# Patient Record
Sex: Male | Born: 1945 | Race: White | Hispanic: No | Marital: Married | Smoking: Former smoker
Health system: Southern US, Community
[De-identification: ages and names within clinical notes are randomized; demographics above are authoritative.]

## PROBLEM LIST (undated history)

## (undated) DIAGNOSIS — N189 Chronic kidney disease, unspecified: Secondary | ICD-10-CM

## (undated) DIAGNOSIS — F1011 Alcohol abuse, in remission: Secondary | ICD-10-CM

## (undated) DIAGNOSIS — K7460 Unspecified cirrhosis of liver: Secondary | ICD-10-CM

## (undated) DIAGNOSIS — R12 Heartburn: Secondary | ICD-10-CM

## (undated) DIAGNOSIS — D649 Anemia, unspecified: Secondary | ICD-10-CM

## (undated) DIAGNOSIS — F419 Anxiety disorder, unspecified: Secondary | ICD-10-CM

## (undated) DIAGNOSIS — I4891 Unspecified atrial fibrillation: Secondary | ICD-10-CM

## (undated) DIAGNOSIS — R0600 Dyspnea, unspecified: Secondary | ICD-10-CM

## (undated) DIAGNOSIS — M1990 Unspecified osteoarthritis, unspecified site: Secondary | ICD-10-CM

## (undated) DIAGNOSIS — K264 Chronic or unspecified duodenal ulcer with hemorrhage: Secondary | ICD-10-CM

## (undated) DIAGNOSIS — K219 Gastro-esophageal reflux disease without esophagitis: Secondary | ICD-10-CM

## (undated) DIAGNOSIS — K8020 Calculus of gallbladder without cholecystitis without obstruction: Secondary | ICD-10-CM

## (undated) DIAGNOSIS — F329 Major depressive disorder, single episode, unspecified: Secondary | ICD-10-CM

## (undated) DIAGNOSIS — H9190 Unspecified hearing loss, unspecified ear: Secondary | ICD-10-CM

## (undated) DIAGNOSIS — I10 Essential (primary) hypertension: Secondary | ICD-10-CM

## (undated) DIAGNOSIS — K7030 Alcoholic cirrhosis of liver without ascites: Secondary | ICD-10-CM

## (undated) DIAGNOSIS — D126 Benign neoplasm of colon, unspecified: Secondary | ICD-10-CM

## (undated) DIAGNOSIS — F32A Depression, unspecified: Secondary | ICD-10-CM

## (undated) DIAGNOSIS — K652 Spontaneous bacterial peritonitis: Secondary | ICD-10-CM

## (undated) DIAGNOSIS — I8500 Esophageal varices without bleeding: Secondary | ICD-10-CM

## (undated) DIAGNOSIS — E119 Type 2 diabetes mellitus without complications: Secondary | ICD-10-CM

## (undated) DIAGNOSIS — A0472 Enterocolitis due to Clostridium difficile, not specified as recurrent: Secondary | ICD-10-CM

## (undated) DIAGNOSIS — R161 Splenomegaly, not elsewhere classified: Secondary | ICD-10-CM

## (undated) HISTORY — DX: Anemia, unspecified: D64.9

## (undated) HISTORY — DX: Essential (primary) hypertension: I10

## (undated) HISTORY — DX: Depression, unspecified: F32.A

## (undated) HISTORY — DX: Alcohol abuse, in remission: F10.11

## (undated) HISTORY — DX: Spontaneous bacterial peritonitis: K65.2

## (undated) HISTORY — DX: Anxiety disorder, unspecified: F41.9

## (undated) HISTORY — DX: Type 2 diabetes mellitus without complications: E11.9

## (undated) HISTORY — DX: Unspecified cirrhosis of liver: K74.60

## (undated) HISTORY — PX: APPENDECTOMY: SHX54

## (undated) HISTORY — DX: Chronic kidney disease, unspecified: N18.9

## (undated) HISTORY — DX: Calculus of gallbladder without cholecystitis without obstruction: K80.20

## (undated) HISTORY — DX: Alcoholic cirrhosis of liver without ascites: K70.30

## (undated) HISTORY — DX: Enterocolitis due to Clostridium difficile, not specified as recurrent: A04.72

## (undated) HISTORY — DX: Unspecified osteoarthritis, unspecified site: M19.90

## (undated) HISTORY — DX: Chronic or unspecified duodenal ulcer with hemorrhage: K26.4

## (undated) HISTORY — PX: CATARACT EXTRACTION: SUR2

## (undated) HISTORY — DX: Benign neoplasm of colon, unspecified: D12.6

## (undated) HISTORY — DX: Splenomegaly, not elsewhere classified: R16.1

## (undated) HISTORY — DX: Major depressive disorder, single episode, unspecified: F32.9

## (undated) HISTORY — DX: Esophageal varices without bleeding: I85.00

## (undated) HISTORY — DX: Heartburn: R12

---

## 1999-04-02 HISTORY — PX: COLONOSCOPY: SHX174

## 2004-11-29 HISTORY — PX: ESOPHAGOGASTRODUODENOSCOPY: SHX1529

## 2004-12-31 ENCOUNTER — Ambulatory Visit: Payer: Self-pay | Admitting: Urgent Care

## 2005-01-02 ENCOUNTER — Ambulatory Visit (HOSPITAL_COMMUNITY): Admission: RE | Admit: 2005-01-02 | Discharge: 2005-01-02 | Payer: Self-pay | Admitting: Internal Medicine

## 2005-01-08 ENCOUNTER — Ambulatory Visit: Payer: Self-pay | Admitting: Internal Medicine

## 2005-01-15 ENCOUNTER — Ambulatory Visit: Payer: Self-pay | Admitting: Internal Medicine

## 2005-01-15 ENCOUNTER — Encounter: Payer: Self-pay | Admitting: Internal Medicine

## 2005-01-15 ENCOUNTER — Ambulatory Visit (HOSPITAL_COMMUNITY): Admission: RE | Admit: 2005-01-15 | Discharge: 2005-01-15 | Payer: Self-pay | Admitting: Internal Medicine

## 2005-01-15 HISTORY — PX: ESOPHAGOGASTRODUODENOSCOPY: SHX1529

## 2005-02-09 ENCOUNTER — Ambulatory Visit: Payer: Self-pay | Admitting: Urgent Care

## 2005-03-10 ENCOUNTER — Ambulatory Visit: Payer: Self-pay | Admitting: Internal Medicine

## 2005-03-10 ENCOUNTER — Encounter: Payer: Self-pay | Admitting: Internal Medicine

## 2005-03-10 ENCOUNTER — Ambulatory Visit (HOSPITAL_COMMUNITY): Admission: RE | Admit: 2005-03-10 | Discharge: 2005-03-10 | Payer: Self-pay | Admitting: Internal Medicine

## 2005-03-10 HISTORY — PX: COLONOSCOPY: SHX174

## 2005-05-13 ENCOUNTER — Ambulatory Visit: Payer: Self-pay | Admitting: Internal Medicine

## 2008-09-29 HISTORY — PX: ESOPHAGOGASTRODUODENOSCOPY: SHX1529

## 2010-03-19 ENCOUNTER — Encounter (INDEPENDENT_AMBULATORY_CARE_PROVIDER_SITE_OTHER): Payer: Self-pay | Admitting: *Deleted

## 2010-07-01 NOTE — Letter (Signed)
Summary: Recall, Screening Colonoscopy Only  [facility]  [address]   [place], [place] [address]   Phone: [phone]  Fax: [phone]    March 19, 2010  KENTARIUS LAIBLE [address] [place], [place]  [address] 08-26-1945   Dear Mr. Sterne,   Our records indicate it is time to schedule your colonoscopy.    Please call our office at [phone] and ask for the nurse.   Thank you,    Burnadette Peter, LPN Waldon Merl, [facility] Ph: [phone]   Fax: [phone]

## 2010-10-17 NOTE — Op Note (Signed)
Jeffrey [name], Jeffrey [name]                  ACCOUNT NO.:  [id number]   MEDICAL RECORD NO.:  [id number]          PATIENT TYPE:  AMB   LOCATION:  DAY                           FACILITY:  [facility]   PHYSICIAN:  R. Garfield Cornea, M.D. DATE OF BIRTH:  [birth year]   DATE OF PROCEDURE:  01/15/2005  DATE OF DISCHARGE:                                 OPERATIVE REPORT   PROCEDURE PERFORMED:  Esophagogastroduodenoscopy with biopsy.   INDICATIONS FOR PROCEDURE:  The patient is a 65 year old gentleman with  longstanding alcohol abuse admitted to hospital [facility]) with  hematemesis and melena.  He underwent esophagogastroduodenoscopy by Cooper Render.  Lindalou Hose, M.D. previously, who felt he had grade 3 esophageal varices, the  stomach was not seen completely well.  He received at least two units of  blood while he was there.   Esophagogastroduodenoscopy is now being done to completely assess his upper  GI tract.  This approach has been discussed with the patient at length,  potential risks, benefits and alternatives have been reviewed and questions  answered.  The patient is agreeable.  Potential for esophageal band ligation  fully explained to the patient and his sister and all parties are agreeable.   Oxygen saturations, blood pressure, pulse and respirations were monitored  throughout the entirety of the procedure.   CONSCIOUS SEDATION:  Versed 3 mg IV, Demerol 75 mg IV in divided doses.  Cetacaine spray for topical pharyngeal anesthesia.   INSTRUMENT USED:  Olympus video chip system.   FINDINGS:  Examination of the tubular esophagus revealed three columns of,  at best, grade 1 to 2 esophageal varices.  Distally, the esophageal mucosa  appeared normal.  When the esophagus was fully inflated, these varices  pretty much disappeared.  Please see photos.  They were not very impressive.  Esophagogastric junction easily traversed.   Stomach:  The gastric cavity was emptied and insufflated well with  air.  Thorough examination of the gastric mucosa including retroflex view of the  proximal stomach, esophagogastric junction demonstrated some nodularity of  the antrum with some overlying erosions. There was no portal gastropathy or  gastric varices or any other mucosal process aside from that noted above.  Pylorus patent and easily traversed.  Examination of the bulb and second  portion revealed some nodularity of the bulb but again, no varices.  The  overlying mucosa appeared normal through the second portion.   THERAPY/DIAGNOSTIC MANEUVERS PERFORMED:  Antral biopsies times two for  histologic study were taken.  The patient tolerated the procedure well was  reacted in endoscopy.   IMPRESSION:  1.  Three columns grade 1 to 2 esophageal varices, otherwise normal      esophageal mucosa.  Esophagus was not manipulated otherwise.  2.  Nodularity of the antrum with overlying erosions, nonspecific finding.      There did not appear to be esophageal varices or stigmata of portal      hypertension.  The gastric mucosa otherwise appeared normal.  The antral      mucosa was biopsied for histologic study.  Nodularity extended down  into      the duodenal bulb.  Otherwise, the D1 and D2 mucosal appeared normal.   RECOMMENDATIONS:  1.  Continue to abstain from alcohol.  Absolutely no NSAIDs of any form.  2.  Continue vaccination for hepatitis A and B.  3.  Follow-up on Helicobacter pylori serologies.  Follow-up appointment with      [place] in three weeks to go over everything.      Bridgette Habermann, M.D.  Electronically Signed     RMR/MEDQ  D:  01/15/2005  T:  01/15/2005  Job:  [id number]   cc:   Jerene Bears  [address]  [place]  [place] [address]  Fax: [phone]

## 2010-10-17 NOTE — H&P (Signed)
NAMENOELLE, DOTO                  ACCOUNT NO.:  [id number]   MEDICAL RECORD NO.:  [id number]          PATIENT TYPE:  AMB   LOCATION:                                FACILITY:  [facility]   PHYSICIAN:  R. Garfield Cornea, M.D. DATE OF BIRTH:  1946/01/03   DATE OF ADMISSION:  02/09/2005  DATE OF DISCHARGE:  LH                                HISTORY & PHYSICAL   PRIMARY CARE PHYSICIAN:  [name].   CHIEF COMPLAINT:  Screening colonoscopy with family history of colon cancer,  history of alcoholic liver disease.   HISTORY OF PRESENT ILLNESS:  Mr. Nowicki is a 65 year old, Caucasian male with  long-standing alcohol abuse history who was recently found to have GI bleed,  esophageal varices and H. pylori gastritis.  He has completed Prevpac.  He  had grade 1-2 esophageal varices.  Biopsies were consistent with H. pylori,  although his serology was negative.  He was treated with a Prevpac and  completed this without any complications.  He has recently received  Hepatitis A and two of the hepatitis B vaccines.  His recent labs reveal  anemia with a hemoglobin of 9.7, hematocrit 28.8 and MCV of 93.7.  He has a  low serum iron at 27 and TIBC percent saturation of 7.  His PT is 16 with an  INR of 1.3.  His sodium is low at 127.  LFTs are normal except for a  slightly elevated AST of 52 and a low albumin of 3.3.  He has a family  history of colon cancer with a father diagnosed at age 61.  He is currently  alive and well.  Mr. Deppe denies any alcohol use since July 24.  His blood  sugars have been fairly well-controlled with a hemoglobin of A1c of 6.1.  He  denies any abdominal pain, nausea, vomiting, heartburn or indigestion.  He  denies any rectal bleeding or melena.  He denies any discoloration of his  urine or fever.  He does have normal bowel movements without any rectal  bleeding or melena.  He is complaining of generalized pruritus.   PAST MEDICAL HISTORY:  1.  Alcohol abuse.  2.  Alcoholic liver  disease.  3.  Colonoscopy by Dr. Lindalou Hose in 2000, which is normal with three      hyperplastic polyps.  4.  Diabetes mellitus x2 years.  5.  Hypertension.  6.  Anxiety.  7.  Depression.  8.  Appendectomy.  9.  Benign neck cyst removed.   CURRENT MEDICATIONS:  1.  Glipizide 10 mg b.i.d.  2.  Protonix 40 mg daily.  3.  Spironolactone 25 mg daily.  4.  Metoprolol 25 mg daily.  5.  Flexatene 10 mg daily.  6.  Multivitamin daily.  7.  Folic acid [id number] mg daily.  8.  B-complex daily.  9.  Garlic [id number] mg daily.  10. Milk thistle 300 mg six per day.   ALLERGIES:  No known drug allergies.   FAMILY HISTORY:  See HPI.   SOCIAL HISTORY:  Mr. Topf is currently single.  He does have a girlfriend.  He is divorced.  He has been divorced x2 years.  He is a full-time, self-  employed Pharmacist, community.  Remote history of tobacco use.  Extensive alcohol  use.  His last drink was July 24.  Denies any drug use.   REVIEW OF SYSTEMS:  CONSTITUTIONAL:  See HPI.  CARDIOVASCULAR:  Denies any  chest pain or shortness of breath, dyspnea, cough or hemoptysis.  GASTROINTESTINAL:  See HPI.   PHYSICAL EXAMINATION:  VITAL SIGNS:  Weight 215.5 pounds, height 68 inches,  temperature 98.3, blood pressure 132/78, pulse 68.  GENERAL:  Mr. Abruzzese is a 65 year old, Caucasian male who is alert and  oriented in no acute distress.  HEENT:  Sclerae clear, nonicteric.  Conjunctivae pink.  Oropharynx pink and  moist without any lesions.  CARDIAC:  Regular rate and rhythm with normal S1, S2 without murmurs, rubs  or gallops.  LUNGS:  Clear to auscultation bilaterally.  ABDOMEN:  Positive bowel sounds x4.  No bruits auscultated.  Soft,  nontender, nondistended.  He does have palpable liver four fingerbreadths  below the right costal margin.  He has splenomegaly.  No rebound tenderness  or guarding.  EXTREMITIES:  Without lower extremity edema bilaterally.   IMPRESSION:  Mr. Gorga is a 65 year old, Caucasian male with  chronic  alcoholic liver disease, esophageal varices, history of gastrointestinal  bleed, iron deficiency anemia and family history of colon cancer.  He has  been abstaining from alcohol.  He has been treated for Helicobacter pylori  gastritis.  He continues to do well.  Today, we will set up a  screening/diagnostic colonoscopy given his history of iron deficiency  anemia.   PLAN:  1.  Colonoscopy in the very near future with Dr. Gala Romney.  I have discussed      the procedure including risks and benefits including, but not limited      to, bleeding, perforation, drug reaction and consent was obtained.  2.  Hydroxyzine 25 mg 1/2 tablet to one tablet p.o. q.8h. p.r.n. arthritis,      #15 with no refills.  I have warned him of sedation.  3.  Appointment in 3 months with Dr. Gala Romney and labs in late October to      include CBC, MET 7, PT/INR and LFTs.      Les Pou, N.P.      Bridgette Habermann, M.D.  Electronically Signed   KC/MEDQ  D:  02/09/2005  T:  02/09/2005  Job:  [id number]

## 2010-10-17 NOTE — Op Note (Signed)
Jeffrey [name], Jeffrey [name]                  ACCOUNT NO.:  [id number]   MEDICAL RECORD NO.:  [id number]          PATIENT TYPE:  AMB   LOCATION:  DAY                           FACILITY:  [facility]   PHYSICIAN:  R. Garfield Cornea, M.D. DATE OF BIRTH:  1945/06/17   DATE OF PROCEDURE:  03/10/2005  DATE OF DISCHARGE:                                 OPERATIVE REPORT   PROCEDURE:  Colonoscopy with snare polypectomy.   INDICATIONS FOR PROCEDURE:  The patient is a 65 year old gentleman with a  history of colonic polyps. Last colonoscopy was around 2000. There was a  positive family history of colorectal cancer in his father. He has no lower  GI tract symptoms. Colonoscopy is now being done. This approach has been  discussed with the patient at length. Potential risks, benefits, and  alternatives have been reviewed and questions answered. He is agreeable.  Please see documentation in the medical record.   PROCEDURE NOTE:  O2 saturation, blood pressure, pulse, and respirations were  monitored throughout the entire procedure. Conscious sedation with Versed 3  mg IV and Demerol 75 mg IV in divided doses.   INSTRUMENT:  Olympus video chip system.   FINDINGS:  Digital rectal exam revealed no abnormalities.   ENDOSCOPIC FINDINGS:  Prep was adequate.   Rectum:  Examination of the rectal mucosa including retroflexed view of the  anal verge revealed a 6-mm pedunculated polyp at 10 cm. The remainder of the  rectal mucosa appeared normal.   Colon:  Colonic mucosa was surveyed from the rectosigmoid junction through  the left, transverse, and right colon to the area of the appendiceal  orifice, ileocecal valve, and cecum. These structures were well seen and  photographed for the record. From this level, the scope was slowly  withdrawn, and all previously mentioned mucosal surfaces were again seen.  The colonic mucosa appeared normal aside from sigmoid diverticula.   The polyp in the rectum was removed with snare  cautery and recovered through  the scope. The patient tolerated the procedure well and was reactive to  endoscopy.   IMPRESSION:  Rectal polyp as described above, removed with snare. Left sided  diverticula. The remainder of the colonic mucosa appeared normal.   RECOMMENDATIONS:  1.  No aspiration or arthritis medications for 10 days.  2.  Follow up on pathology.  3.  Further recommendations to follow.      Bridgette Habermann, M.D.  Electronically Signed     RMR/MEDQ  D:  03/10/2005  T:  03/10/2005  Job:  [id number]   cc:   Manuella Ghazi, M.D.  [facility]

## 2012-02-25 LAB — COMPREHENSIVE METABOLIC PANEL
Cholesterol, Total: 112
HCT: 28 %
Hemoglobin: 8.3 g/dL — AB (ref 13.5–17.5)
Hgb A1c MFr Bld: 7.5 % — AB (ref 4.0–6.0)
MCV: 82 fL
Platelets: 125 10*3/uL
Triglycerides: 110
WBC: 1.9

## 2012-03-08 ENCOUNTER — Encounter: Payer: Self-pay | Admitting: Internal Medicine

## 2012-03-09 ENCOUNTER — Encounter: Payer: Self-pay | Admitting: Gastroenterology

## 2012-03-09 ENCOUNTER — Ambulatory Visit (INDEPENDENT_AMBULATORY_CARE_PROVIDER_SITE_OTHER): Payer: Medicare Other | Admitting: Gastroenterology

## 2012-03-09 VITALS — BP 128/64 | HR 74 | Temp 98.0°F | Ht 68.0 in | Wt 232.2 lb

## 2012-03-09 DIAGNOSIS — K7030 Alcoholic cirrhosis of liver without ascites: Secondary | ICD-10-CM

## 2012-03-09 DIAGNOSIS — R195 Other fecal abnormalities: Secondary | ICD-10-CM

## 2012-03-09 DIAGNOSIS — K7031 Alcoholic cirrhosis of liver with ascites: Secondary | ICD-10-CM | POA: Insufficient documentation

## 2012-03-09 DIAGNOSIS — D500 Iron deficiency anemia secondary to blood loss (chronic): Secondary | ICD-10-CM | POA: Insufficient documentation

## 2012-03-09 DIAGNOSIS — D61818 Other pancytopenia: Secondary | ICD-10-CM | POA: Insufficient documentation

## 2012-03-09 DIAGNOSIS — I8510 Secondary esophageal varices without bleeding: Secondary | ICD-10-CM

## 2012-03-09 DIAGNOSIS — Z800 Family history of malignant neoplasm of digestive organs: Secondary | ICD-10-CM | POA: Insufficient documentation

## 2012-03-09 MED ORDER — PEG 3350-KCL-NA BICARB-NACL 420 G PO SOLR: 4000.0000 mL | ORAL | Status: DC

## 2012-03-09 NOTE — Assessment & Plan Note (Signed)
History of pancytopenia, anemia requiring transfusion, Hemoccult-positive stool in the setting of cirrhosis. He has a history of esophageal varices, last endoscopy about 7 years ago. He was lost to followup on his part due to lack of insurance. Suspect anemia multifactorial related to cirrhosis/splenomegaly/bone marrow suppression as well as occult GI bleeding. Differential diagnosis for occult GI bleeding includes anything from portal gastropathy, erosions, polyps, malignancy. Recommend upper endoscopy with esophageal banding if needed, colonoscopy with Dr. Gala Romney in the near future. He had adequate conscious sedation with his previous procedures therefore we'll plan for the same.  I have discussed the risks, alternatives, benefits with regards to but not limited to the risk of reaction to medication, bleeding, infection, perforation and the patient is agreeable to proceed. Written consent to be obtained.

## 2012-03-09 NOTE — Assessment & Plan Note (Signed)
Patient lost to followup after 2006 as outlined above. He needs hepatoma screening in the way of abdominal ultrasound if not done and alpha-fetoprotein tumor marker. Have requested records for last abdominal ultrasound. We'll also check CMET, INR, CBC.  Along with other records that we have requested as outlined above, we have requested last EGD and colonoscopy report done at [facility] possibly by Dr. Laural Golden.

## 2012-03-09 NOTE — Patient Instructions (Addendum)
Please have your blood work done today. I have requested records from [facility] regarding your last abdominal ultrasound and upper endoscopy by Dr. Laural Golden. We have scheduled you for an upper endoscopy with possible esophageal variceal banding and colonoscopy with Dr. Gala Romney. Please see separate instructions. The day of your bowel prep you will take half of your normal dose of Glucotrol, levimir, metformin, NovoLog.

## 2012-03-09 NOTE — Progress Notes (Signed)
Primary Care Physician:  Monico Blitz, MD  Primary Gastroenterologist:  Garfield Cornea, MD   Chief Complaint  Patient presents with  . Follow-up    HPI:  Jeffrey [name] is a 66 y.o. male here for further evaluation of Hemoccult-positive stool. He was referred by [name]. He has been seen by our practice previously back in 2006. He has a history of alcoholic cirrhosis which was diagnosed after presenting with a upper GI bleed around that time. Patient tells me that he actually bled from a duodenal ulcer, we have requested those records. Ultimately he underwent EGD by Dr. Gala Romney on August 2006which showed 3 columns of grade 1-2 esophageal varices which were not manipulated. Nodularity of the antrum with overlying erosions, path showed rare H. Pylori. He completed Prevpac therapy. Colonoscopy in October 2006 showed rectal polyps which was an inflamed polyp on pathology. He had left-sided diverticula. Colonoscopy back in 2000 reportedly he had 3 hyperplastic polyps, this is a met Dr. Duffy Rhody. Patient has a family history of colon cancer, father diagnosed with early stage disease in his mid 58s. He is currently doing well at age 18. Patient was lost to followup, according to him because he had an insurance. He has not had hepatoma screening along the way.  Patient reports that he's had a total of 3 units of packed red blood cells over the last several months for persistent anemia. Initially he was taking over-the-counter iron once a day but recently was started on description iron which she takes twice daily. He denies any melena or rectal bleeding. Denies nosebleeds or hematuria. Overall he had been doing well until recently he has some lower extremity edema and was placed on Lasix and Aldactone. He blames this on excessive salt intake. Swelling has now resolved. He reports having a recent abdominal ultrasound, we have requested records. He denies dysphagia, vomiting, heartburn, abdominal pain.  Current  Outpatient Prescriptions  Medication Sig Dispense Refill  . Ascorbic Acid (VITAMIN C) 1000 MG tablet Take 1,000 mg by mouth daily.      . Cinnamon 500 MG capsule Take 500 mg by mouth daily.      . cyclobenzaprine (FLEXERIL) 10 MG tablet Take 10 mg by mouth daily.       [name] FLUoxetine (PROZAC) 20 MG capsule Take 20 mg by mouth daily.       . furosemide (LASIX) 20 MG tablet Take 20 mg by mouth daily.       [name] glipiZIDE (GLUCOTROL) 10 MG tablet Take 10 mg by mouth 2 (two) times daily before a meal.       . LEVEMIR FLEXPEN 100 UNIT/ML injection Inject 80 Units into the skin at bedtime.       . metFORMIN (GLUCOPHAGE) 1000 MG tablet Take 1,000 mg by mouth 2 (two) times daily with a meal.       . milk thistle 175 MG tablet Take 175 mg by mouth daily.      . Multiple Vitamin (MULTIVITAMIN) capsule Take 1 capsule by mouth daily.      [name] NOVOLOG FLEXPEN 100 UNIT/ML injection Inject 30 Units into the skin 3 (three) times daily before meals.       . propranolol (INDERAL) 20 MG tablet Take 20 mg by mouth 3 (three) times daily.       . ranitidine (ZANTAC) 150 MG tablet Take 150 mg by mouth 2 (two) times daily.       [name] spironolactone (ALDACTONE) 25 MG tablet Take 25 mg by  mouth daily.       . Thiamine HCl (VITAMIN B-1) 250 MG tablet Take 250 mg by mouth daily.        Allergies as of 03/09/2012  . (No Known Allergies)    Past Medical History  Diagnosis Date  . Diabetes   . History of alcohol abuse     quit in 2006  . Hypertension   . Anxiety   . Depression   . Asymptomatic gallstones     Ultrasound in 2006  . Splenomegaly     Ultrasound in 2006  . Alcoholic cirrhosis     patient reports completing hep a and b vaccines in 2006  . Esophageal varices     see PSH  . Anemia     has had 3 units of prbcs 2013  . Duodenal ulcer with hemorrhage     per patient in 2006 or 2007, records have been requested    Past Surgical History  Procedure Date  . Colonoscopy 03/10/2005    Rectal polyp as  described above, removed with snare. Left sided  diverticula. The remainder of the colonic mucosa appeared normal. Inflammed polyp on path.  . Esophagogastroduodenoscopy 01/15/2005    Three columns grade 1 to 2 esophageal varices, otherwise normal esophageal mucosa.  Esophagus was not manipulated otherwise./Nodularity of the antrum with overlying erosions, nonspecific finding. Path showed rare H.pylori  . Appendectomy     Family History  Problem Relation Age of Onset  . Colon cancer Father 81    still living, age 71  . Breast cancer Sister     deceased  . Breast cancer Sister   . Lung cancer Neg Hx   . Ovarian cancer Neg Hx     History   Social History  . Marital Status: Divorced    Spouse Name: N/A    Number of Children: 2  . Years of Education: N/A   Occupational History  . Not on file.   Social History Main Topics  . Smoking status: Former Smoker -- 0.5 packs/day    Types: Cigarettes  . Smokeless tobacco: Not on file   Comment: qiut about 25+ years  . Alcohol Use: No     quit in 2006  . Drug Use: No  . Sexually Active: Not on file   Other Topics Concern  . Not on file   Social History Narrative   Two step children from second marriage (divorced) who live with him along with some grandchildren.       ROS:  General: Negative for anorexia, weight loss, fever, chills, fatigue, weakness. Eyes: Negative for vision changes.  ENT: Negative for hoarseness, difficulty swallowing , nasal congestion. CV: Negative for chest pain, angina, palpitations, dyspnea on exertion. See history of present illness for recent peripheral edema.  Respiratory: Negative for dyspnea at rest, dyspnea on exertion, cough, sputum, wheezing.  GI: See history of present illness. GU:  Negative for dysuria, hematuria, urinary incontinence, urinary frequency, nocturnal urination.  MS: Negative for joint pain, low back pain.  Derm: Negative for rash or itching.  Neuro: Negative for weakness,  abnormal sensation, seizure, frequent headaches, memory loss, confusion.  Psych: Negative for anxiety, depression, suicidal ideation, hallucinations.  Endo: Negative for unusual weight change.  Heme: Negative for bruising or bleeding. Allergy: Negative for rash or hives.    Physical Examination:  BP 128/64  Pulse 74  Temp 98 F (36.7 C) (Temporal)  Ht 5\' 8"  (1.727 m)  Wt 232 lb 3.2 oz (105.325  kg)  BMI 35.31 kg/m2   General: Well-nourished, well-developed in no acute distress. Accompanied by his sister. Head: Normocephalic, atraumatic.   Eyes: Conjunctiva pink, no icterus. Mouth: Oropharyngeal mucosa moist and pink , no lesions erythema or exudate. Neck: Supple without thyromegaly, masses, or lymphadenopathy.  Lungs: Clear to auscultation bilaterally.  Heart: Regular rate and rhythm, no murmurs rubs or gallops.  Abdomen: Bowel sounds are normal, nontender, nondistended, no hepatomegaly or masses, no abdominal bruits, no rebound or guarding.  No fluid wave. Small umbilical hernia easily reducible. Spleen tip is palpable. Rectal: defer Extremities: No lower extremity edema. No clubbing or deformities.  Neuro: Alert and oriented x 4 , grossly normal neurologically.  Skin: Warm and dry, no rash or jaundice.   Psych: Alert and cooperative, normal mood and affect.  Labs: Labs from 02/25/2012. White blood cell count 1900, hemoglobin 8.3, hematocrit 28.4, MCV 82, platelets 125,000, hemoglobin A1c 7.5, total cholesterol 112, triglycerides 110.  Imaging Studies: No results found.

## 2012-03-10 LAB — CBC WITH DIFFERENTIAL/PLATELET
Basophils Absolute: 0 10*3/uL (ref 0.0–0.1)
Basophils Relative: 1 % (ref 0–1)
Eosinophils Absolute: 0.1 10*3/uL (ref 0.0–0.7)
Eosinophils Relative: 5 % (ref 0–5)
HCT: 32.4 % — ABNORMAL LOW (ref 39.0–52.0)
Hemoglobin: 9.4 g/dL — ABNORMAL LOW (ref 13.0–17.0)
Lymphocytes Relative: 20 % (ref 12–46)
Lymphs Abs: 0.5 10*3/uL — ABNORMAL LOW (ref 0.7–4.0)
MCH: 24.1 pg — ABNORMAL LOW (ref 26.0–34.0)
MCHC: 29 g/dL — ABNORMAL LOW (ref 30.0–36.0)
MCV: 83.1 fL (ref 78.0–100.0)
Monocytes Absolute: 0.3 10*3/uL (ref 0.1–1.0)
Monocytes Relative: 11 % (ref 3–12)
Neutro Abs: 1.5 10*3/uL — ABNORMAL LOW (ref 1.7–7.7)
Neutrophils Relative %: 63 % (ref 43–77)
Platelets: 117 10*3/uL — ABNORMAL LOW (ref 150–400)
RBC: 3.9 MIL/uL — ABNORMAL LOW (ref 4.22–5.81)
RDW: 19.8 % — ABNORMAL HIGH (ref 11.5–15.5)
WBC: 2.4 10*3/uL — ABNORMAL LOW (ref 4.0–10.5)

## 2012-03-10 LAB — COMPREHENSIVE METABOLIC PANEL
ALT: 24 U/L (ref 0–53)
AST: 30 U/L (ref 0–37)
Albumin: 4.3 g/dL (ref 3.5–5.2)
Alkaline Phosphatase: 69 U/L (ref 39–117)
BUN: 9 mg/dL (ref 6–23)
CO2: 28 mEq/L (ref 19–32)
Calcium: 9.9 mg/dL (ref 8.4–10.5)
Chloride: 98 mEq/L (ref 96–112)
Creat: 0.7 mg/dL (ref 0.50–1.35)
Glucose, Bld: 133 mg/dL — ABNORMAL HIGH (ref 70–99)
Potassium: 4.2 mEq/L (ref 3.5–5.3)
Sodium: 136 mEq/L (ref 135–145)
Total Bilirubin: 1.1 mg/dL (ref 0.3–1.2)
Total Protein: 7.2 g/dL (ref 6.0–8.3)

## 2012-03-10 LAB — PROTIME-INR
INR: 1.16 (ref <1.50)
Prothrombin Time: 14.7 seconds (ref 11.6–15.2)

## 2012-03-10 LAB — AFP TUMOR MARKER: AFP-Tumor Marker: 2 ng/mL (ref 0.0–8.0)

## 2012-03-10 NOTE — Progress Notes (Signed)
Faxed to PCP

## 2012-03-14 ENCOUNTER — Encounter: Payer: Self-pay | Admitting: Gastroenterology

## 2012-03-14 NOTE — Progress Notes (Signed)
Quick Note:  Please let patient know. His Hgb is 9.4 somewhat better. WBC/platelet counts stable. Baseline AFP normal. LFTs. Normal.  MELD 5, showing good liver function.  He still needs abd u/s for [facility] screening. Formal one not available from PCP or [facility].  EGD/TCS as planned. ______

## 2012-03-14 NOTE — Progress Notes (Signed)
Reviewed records regarding EGD/colonoscopies. Updated PSH.  Past Surgical History  Procedure Date  . Colonoscopy 03/10/2005    Rectal polyp as described above, removed with snare. Left sided  diverticula. The remainder of the colonic mucosa appeared normal. Inflammed polyp on path.  . Esophagogastroduodenoscopy 01/15/2005    Three columns grade 1 to 2 esophageal varices, otherwise normal esophageal mucosa.  Esophagus was not manipulated otherwise./Nodularity of the antrum with overlying erosions, nonspecific finding. Path showed rare H.pylori  . Appendectomy   . Esophagogastroduodenoscopy 09/2008    Dr. Gaylene Brooks columns of grade 2-3 esoph varices, only one column was prominent. Portal gastropathy, multiple gastrc polyps at antrum, two were 2cm with black eschar, bulbar polyps, bulbar erosions  . Esophagogastroduodenoscopy 11/2004    Dr. Brantley Stage 3 esoph varices  . Colonoscopy 04/1999    Dr. Thea Silversmith polyps removed,    Path for procedures in 2000/2010 has been requested.  Patient had CT without contrast in 05/2011. He had cirrhosis, splenomegaly, cholelithiasis, soft tissue thickening and inflammation along anterior LLQ, ?soft tissue injury or chr inflammation.

## 2012-03-18 ENCOUNTER — Encounter: Payer: Self-pay | Admitting: Gastroenterology

## 2012-03-21 NOTE — Progress Notes (Signed)
Quick Note:  Tried to call pt- LMOM ______ 

## 2012-03-24 ENCOUNTER — Encounter (HOSPITAL_COMMUNITY): Payer: Self-pay | Admitting: Pharmacy Technician

## 2012-03-24 ENCOUNTER — Other Ambulatory Visit: Payer: Self-pay | Admitting: Gastroenterology

## 2012-03-24 DIAGNOSIS — C220 Liver cell carcinoma: Secondary | ICD-10-CM

## 2012-03-28 MED ORDER — BUPIVACAINE HCL (PF) 0.5 % IJ SOLN
INTRAMUSCULAR | Status: AC
  Filled 2012-03-28: qty 30

## 2012-03-28 MED ORDER — BACITRACIN ZINC 500 UNIT/GM EX OINT
TOPICAL_OINTMENT | CUTANEOUS | Status: AC
  Filled 2012-03-28: qty 0.9

## 2012-03-28 NOTE — Progress Notes (Signed)
Patient is scheduled on Monday Nov 4th at 7:45 and his wife is aware

## 2012-03-29 ENCOUNTER — Telehealth: Payer: Self-pay | Admitting: Gastroenterology

## 2012-03-29 NOTE — Telephone Encounter (Signed)
Pt's wife/mother called to say that [facility] had ordered an abd [place] for Nov 14 and the patients had an US done in Sept. Ordered by PCP and could we use that instead of having another one done. Please advise and call patient at [phone]

## 2012-03-29 NOTE — Telephone Encounter (Signed)
We requested u/s report from [facility], none available. Please request from PCP to see if one available through their office.

## 2012-03-29 NOTE — Telephone Encounter (Signed)
Called Dr Trena Platt office, they do not have the results for a [place], or an order to verify that one was written.  The patient did have a stay at [facility] and had a transfusion, but no [place] noted, per Dr Trena Platt office.

## 2012-03-30 MED ORDER — SODIUM CHLORIDE 0.45 % IV SOLN
INTRAVENOUS | Status: DC
  Administered 2012-03-31: 08:00:00 via INTRAVENOUS

## 2012-03-31 ENCOUNTER — Ambulatory Visit (HOSPITAL_COMMUNITY)
Admission: RE | Admit: 2012-03-31 | Discharge: 2012-03-31 | Disposition: A | Discharge status: Home / Self Care | Payer: Medicare Other | Source: Ambulatory Visit | Attending: Internal Medicine | Admitting: Internal Medicine

## 2012-03-31 ENCOUNTER — Encounter (HOSPITAL_COMMUNITY): Payer: Self-pay | Admitting: *Deleted

## 2012-03-31 ENCOUNTER — Encounter (HOSPITAL_COMMUNITY)
Admission: RE | Disposition: A | Discharge status: Home / Self Care | Payer: Self-pay | Source: Ambulatory Visit | Attending: Internal Medicine

## 2012-03-31 DIAGNOSIS — K5730 Diverticulosis of large intestine without perforation or abscess without bleeding: Secondary | ICD-10-CM

## 2012-03-31 DIAGNOSIS — D649 Anemia, unspecified: Secondary | ICD-10-CM

## 2012-03-31 DIAGNOSIS — K449 Diaphragmatic hernia without obstruction or gangrene: Secondary | ICD-10-CM | POA: Insufficient documentation

## 2012-03-31 DIAGNOSIS — D126 Benign neoplasm of colon, unspecified: Secondary | ICD-10-CM | POA: Insufficient documentation

## 2012-03-31 DIAGNOSIS — K921 Melena: Secondary | ICD-10-CM | POA: Insufficient documentation

## 2012-03-31 DIAGNOSIS — I8500 Esophageal varices without bleeding: Secondary | ICD-10-CM | POA: Insufficient documentation

## 2012-03-31 DIAGNOSIS — Z01812 Encounter for preprocedural laboratory examination: Secondary | ICD-10-CM | POA: Insufficient documentation

## 2012-03-31 DIAGNOSIS — D509 Iron deficiency anemia, unspecified: Secondary | ICD-10-CM | POA: Insufficient documentation

## 2012-03-31 DIAGNOSIS — R195 Other fecal abnormalities: Secondary | ICD-10-CM

## 2012-03-31 DIAGNOSIS — D500 Iron deficiency anemia secondary to blood loss (chronic): Secondary | ICD-10-CM

## 2012-03-31 DIAGNOSIS — I8510 Secondary esophageal varices without bleeding: Secondary | ICD-10-CM

## 2012-03-31 DIAGNOSIS — K7030 Alcoholic cirrhosis of liver without ascites: Secondary | ICD-10-CM

## 2012-03-31 DIAGNOSIS — K319 Disease of stomach and duodenum, unspecified: Secondary | ICD-10-CM

## 2012-03-31 DIAGNOSIS — K5520 Angiodysplasia of colon without hemorrhage: Secondary | ICD-10-CM

## 2012-03-31 HISTORY — PX: GASTRIC VARICES BANDING: SHX5519

## 2012-03-31 HISTORY — PX: ESOPHAGOGASTRODUODENOSCOPY: SHX1529

## 2012-03-31 HISTORY — PX: COLONOSCOPY WITH ESOPHAGOGASTRODUODENOSCOPY (EGD): SHX5779

## 2012-03-31 LAB — GLUCOSE, CAPILLARY: Glucose-Capillary: 115 mg/dL — ABNORMAL HIGH (ref 70–99)

## 2012-03-31 SURGERY — COLONOSCOPY WITH ESOPHAGOGASTRODUODENOSCOPY (EGD)
Anesthesia: Moderate Sedation

## 2012-03-31 MED ORDER — MEPERIDINE HCL 100 MG/ML IJ SOLN
INTRAMUSCULAR | Status: DC | PRN
  Administered 2012-03-31: 50 mg via INTRAVENOUS
  Administered 2012-03-31 (×3): 25 mg via INTRAVENOUS

## 2012-03-31 MED ORDER — SODIUM CHLORIDE 0.9 % IJ SOLN
INTRAMUSCULAR | Status: DC | PRN
  Administered 2012-03-31: 10:00:00

## 2012-03-31 MED ORDER — BUTAMBEN-TETRACAINE-BENZOCAINE 2-2-14 % EX AERO
INHALATION_SPRAY | CUTANEOUS | Status: DC | PRN
  Administered 2012-03-31: 2 via TOPICAL

## 2012-03-31 MED ORDER — MIDAZOLAM HCL 5 MG/5ML IJ SOLN
INTRAMUSCULAR | Status: DC | PRN
  Administered 2012-03-31: 2 mg via INTRAVENOUS
  Administered 2012-03-31 (×4): 1 mg via INTRAVENOUS
  Administered 2012-03-31: 2 mg via INTRAVENOUS

## 2012-03-31 MED ORDER — STERILE WATER FOR IRRIGATION IR SOLN
Status: DC | PRN
  Administered 2012-03-31: 09:00:00

## 2012-03-31 MED ORDER — MIDAZOLAM HCL 5 MG/5ML IJ SOLN
INTRAMUSCULAR | Status: AC
  Filled 2012-03-31: qty 10

## 2012-03-31 MED ORDER — MEPERIDINE HCL 100 MG/ML IJ SOLN
INTRAMUSCULAR | Status: AC
  Filled 2012-03-31: qty 2

## 2012-03-31 NOTE — H&P (View-Only) (Signed)
Primary Care Physician:  Monico Blitz, MD  Primary Gastroenterologist:  Garfield Cornea, MD   Chief Complaint  Patient presents with  . Follow-up    HPI:  Jeffrey [name] is a 66 y.o. male here for further evaluation of Hemoccult-positive stool. He was referred by [name]. He has been seen by our practice previously back in 2006. He has a history of alcoholic cirrhosis which was diagnosed after presenting with a upper GI bleed around that time. Patient tells me that he actually bled from a duodenal ulcer, we have requested those records. Ultimately he underwent EGD by Dr. Gala Romney on August 2006which showed 3 columns of grade 1-2 esophageal varices which were not manipulated. Nodularity of the antrum with overlying erosions, path showed rare H. Pylori. He completed Prevpac therapy. Colonoscopy in October 2006 showed rectal polyps which was an inflamed polyp on pathology. He had left-sided diverticula. Colonoscopy back in 2000 reportedly he had 3 hyperplastic polyps, this is a met Dr. Duffy Rhody. Patient has a family history of colon cancer, father diagnosed with early stage disease in his mid 29s. He is currently doing well at age 89. Patient was lost to followup, according to him because he had an insurance. He has not had hepatoma screening along the way.  Patient reports that he's had a total of 3 units of packed red blood cells over the last several months for persistent anemia. Initially he was taking over-the-counter iron once a day but recently was started on description iron which she takes twice daily. He denies any melena or rectal bleeding. Denies nosebleeds or hematuria. Overall he had been doing well until recently he has some lower extremity edema and was placed on Lasix and Aldactone. He blames this on excessive salt intake. Swelling has now resolved. He reports having a recent abdominal ultrasound, we have requested records. He denies dysphagia, vomiting, heartburn, abdominal pain.  Current  Outpatient Prescriptions  Medication Sig Dispense Refill  . Ascorbic Acid (VITAMIN C) 1000 MG tablet Take 1,000 mg by mouth daily.      . Cinnamon 500 MG capsule Take 500 mg by mouth daily.      . cyclobenzaprine (FLEXERIL) 10 MG tablet Take 10 mg by mouth daily.       [name] FLUoxetine (PROZAC) 20 MG capsule Take 20 mg by mouth daily.       . furosemide (LASIX) 20 MG tablet Take 20 mg by mouth daily.       [name] glipiZIDE (GLUCOTROL) 10 MG tablet Take 10 mg by mouth 2 (two) times daily before a meal.       . LEVEMIR FLEXPEN 100 UNIT/ML injection Inject 80 Units into the skin at bedtime.       . metFORMIN (GLUCOPHAGE) 1000 MG tablet Take 1,000 mg by mouth 2 (two) times daily with a meal.       . milk thistle 175 MG tablet Take 175 mg by mouth daily.      . Multiple Vitamin (MULTIVITAMIN) capsule Take 1 capsule by mouth daily.      [name] NOVOLOG FLEXPEN 100 UNIT/ML injection Inject 30 Units into the skin 3 (three) times daily before meals.       . propranolol (INDERAL) 20 MG tablet Take 20 mg by mouth 3 (three) times daily.       . ranitidine (ZANTAC) 150 MG tablet Take 150 mg by mouth 2 (two) times daily.       [name] spironolactone (ALDACTONE) 25 MG tablet Take 25 mg by  mouth daily.       . Thiamine HCl (VITAMIN B-1) 250 MG tablet Take 250 mg by mouth daily.        Allergies as of 03/09/2012  . (No Known Allergies)    Past Medical History  Diagnosis Date  . Diabetes   . History of alcohol abuse     quit in 2006  . Hypertension   . Anxiety   . Depression   . Asymptomatic gallstones     Ultrasound in 2006  . Splenomegaly     Ultrasound in 2006  . Alcoholic cirrhosis     patient reports completing hep a and b vaccines in 2006  . Esophageal varices     see PSH  . Anemia     has had 3 units of prbcs 2013  . Duodenal ulcer with hemorrhage     per patient in 2006 or 2007, records have been requested    Past Surgical History  Procedure Date  . Colonoscopy 03/10/2005    Rectal polyp as  described above, removed with snare. Left sided  diverticula. The remainder of the colonic mucosa appeared normal. Inflammed polyp on path.  . Esophagogastroduodenoscopy 01/15/2005    Three columns grade 1 to 2 esophageal varices, otherwise normal esophageal mucosa.  Esophagus was not manipulated otherwise./Nodularity of the antrum with overlying erosions, nonspecific finding. Path showed rare H.pylori  . Appendectomy     Family History  Problem Relation Age of Onset  . Colon cancer Father 73    still living, age 88  . Breast cancer Sister     deceased  . Breast cancer Sister   . Lung cancer Neg Hx   . Ovarian cancer Neg Hx     History   Social History  . Marital Status: Divorced    Spouse Name: N/A    Number of Children: 2  . Years of Education: N/A   Occupational History  . Not on file.   Social History Main Topics  . Smoking status: Former Smoker -- 0.5 packs/day    Types: Cigarettes  . Smokeless tobacco: Not on file   Comment: qiut about 25+ years  . Alcohol Use: No     quit in 2006  . Drug Use: No  . Sexually Active: Not on file   Other Topics Concern  . Not on file   Social History Narrative   Two step children from second marriage (divorced) who live with him along with some grandchildren.       ROS:  General: Negative for anorexia, weight loss, fever, chills, fatigue, weakness. Eyes: Negative for vision changes.  ENT: Negative for hoarseness, difficulty swallowing , nasal congestion. CV: Negative for chest pain, angina, palpitations, dyspnea on exertion. See history of present illness for recent peripheral edema.  Respiratory: Negative for dyspnea at rest, dyspnea on exertion, cough, sputum, wheezing.  GI: See history of present illness. GU:  Negative for dysuria, hematuria, urinary incontinence, urinary frequency, nocturnal urination.  MS: Negative for joint pain, low back pain.  Derm: Negative for rash or itching.  Neuro: Negative for weakness,  abnormal sensation, seizure, frequent headaches, memory loss, confusion.  Psych: Negative for anxiety, depression, suicidal ideation, hallucinations.  Endo: Negative for unusual weight change.  Heme: Negative for bruising or bleeding. Allergy: Negative for rash or hives.    Physical Examination:  BP 128/64  Pulse 74  Temp 98 F (36.7 C) (Temporal)  Ht 5\' 8"  (1.727 m)  Wt 232 lb 3.2 oz (105.325  kg)  BMI 35.31 kg/m2   General: Well-nourished, well-developed in no acute distress. Accompanied by his sister. Head: Normocephalic, atraumatic.   Eyes: Conjunctiva pink, no icterus. Mouth: Oropharyngeal mucosa moist and pink , no lesions erythema or exudate. Neck: Supple without thyromegaly, masses, or lymphadenopathy.  Lungs: Clear to auscultation bilaterally.  Heart: Regular rate and rhythm, no murmurs rubs or gallops.  Abdomen: Bowel sounds are normal, nontender, nondistended, no hepatomegaly or masses, no abdominal bruits, no rebound or guarding.  No fluid wave. Small umbilical hernia easily reducible. Spleen tip is palpable. Rectal: defer Extremities: No lower extremity edema. No clubbing or deformities.  Neuro: Alert and oriented x 4 , grossly normal neurologically.  Skin: Warm and dry, no rash or jaundice.   Psych: Alert and cooperative, normal mood and affect.  Labs: Labs from 02/25/2012. White blood cell count 1900, hemoglobin 8.3, hematocrit 28.4, MCV 82, platelets 125,000, hemoglobin A1c 7.5, total cholesterol 112, triglycerides 110.  Imaging Studies: No results found.

## 2012-03-31 NOTE — Interval H&P Note (Signed)
History and Physical Interval Note:  03/31/2012 8:51 AM  Jeffrey [name]  has presented today for surgery, with the diagnosis of HEME POSITIVE STOOL, ESOPHAGEAL VARICES, CIRRHISIS  The various methods of treatment have been discussed with the patient and family. After consideration of risks, benefits and other options for treatment, the patient has consented to  Procedure(s) (LRB) with comments: COLONOSCOPY WITH ESOPHAGOGASTRODUODENOSCOPY (EGD) (N/A) - 8:15 GASTRIC VARICES BANDING (N/A) as a surgical intervention .  The patient's history has been reviewed, patient examined, no change in status, stable for surgery.  I have reviewed the patient's chart and labs.  Questions were answered to the patient's satisfaction.     Manus Rudd

## 2012-03-31 NOTE — Op Note (Signed)
[facility] [address] [place], [address]   ENDOSCOPY PROCEDURE REPORT  PATIENT: Jeffrey [name], Jeffrey [name]  MR#: [id number] BIRTHDATE: 12-01-1945 , 66  yrs. old GENDER: Male ENDOSCOPIST: R.  Garfield Cornea, MD FACP FACG REFERRED BY:  Monico Blitz, M.D. PROCEDURE DATE:  03/31/2012 PROCEDURE:     EGD with gastric biopsy  INDICATIONS:     Anemia; Hemoccult positive stool. Cirrhosis-known esophageal varices  INFORMED CONSENT:   The risks, benefits, limitations, alternatives and imponderables have been discussed.  The potential for biopsy, esophogeal dilation, etc. have also been reviewed.  Questions have been answered.  All parties agreeable.  Please see the history and physical in the medical record for more information.  MEDICATIONS:   Versed 5 mg IV and Demerol 100 mg IV in divided doses.  DESCRIPTION OF PROCEDURE:   The Pentax Gastroscope [id number] endoscope was introduced through the mouth and advanced to the second portion of the duodenum without difficulty or limitations. The mucosal surfaces were surveyed very carefully during advancement of the scope and upon withdrawal.  Retroflexion view of the proximal stomach and esophagogastric junction was performed.      FINDINGS: 4 columns of esophageal varices without stigmata of bleeding. 3 columns of grade 2; 1 column of grade 1; overlying esophageal mucosa appeared normal otherwise. Stomach empty. 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 Small hiatal hernia. Multiple polyps single leg clusters in the antrum with overlying ulceration. 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 No gastric varices. Patent pylorus. Normal first and second portion of the duodenum  THERAPEUTIC / DIAGNOSTIC MANEUVERS PERFORMED:  The antral polyps were biopsied.   COMPLICATIONS:  None  IMPRESSION:  Nonbleeding esophageal varices; portal gastropathy; small hiatal hernia; ulcerated  gastric polyps; early G AVE; Patient could easily be Hemoccult positive from gastric mucosal oozing. Likewise, a significant component of his anemia could be due to this process as well. It would be a very risky proposition to attempt removal of the gastric polyps seen today.  I note his heart rate has been running in the 70s and 80s recently on Inderal. He would benefit from a higher dose for primary prophylaxis against variceal bleed, Decreasing portal pressure.   RECOMMENDATIONS:  Will increase Inderal; Followup on pathology; see colonoscopy report    _______________________________ R. Garfield Cornea, MD FACP [facility] eSigned:  R. Garfield Cornea, MD FACP [facility] 03/31/2012 9:23 AM     CC:  PATIENT NAME:  Jeffrey [name], Jeffrey [name] MR#: [id number]

## 2012-03-31 NOTE — Op Note (Signed)
[facility] [address] [place], [address]   COLONOSCOPY PROCEDURE REPORT  PATIENT: Jeffrey [name], Jeffrey [name]  MR#:         [id number] BIRTHDATE: 05/09/1946 , 66  yrs. old GENDER: Male ENDOSCOPIST: R.  Garfield Cornea, MD FACP FACG REFERRED BY:  Monico Blitz, M.D. PROCEDURE DATE:  03/31/2012 PROCEDURE:     colonoscopy with polyp ablation and snare polypectomy  INDICATIONS: Hemoccult-positive stool  INFORMED CONSENT:  The risks, benefits, alternatives and imponderables including but not limited to bleeding, perforation as well as the possibility of a missed lesion have been reviewed.  The potential for biopsy, lesion removal, etc. have also been discussed.  Questions have been answered.  All parties agreeable. Please see the history and physical in the medical record for more information.  MEDICATIONS: Versed 8 mg IV and Demerol 125 mg IV in divided doses  DESCRIPTION OF PROCEDURE:  After a digital rectal exam was performed, the [id number]) and [id number]) colonoscope was advanced from the anus through the rectum and colon to the area of the cecum, ileocecal valve and appendiceal orifice. The cecum was deeply intubated.  These structures were well-seen and photographed for the record.  From the level of the cecum and ileocecal valve, the scope was slowly and cautiously withdrawn. The mucosal surfaces were carefully surveyed utilizing scope tip deflection to facilitate fold flattening as needed.  The scope was pulled down into the rectum where a thorough examination including retroflexion was performed.    FINDINGS:  Adequate preparation. Normal rectum. Few scattered sigmoid diverticula. Multiple nonbleeding sigmoid AVMs. Patient had a 6 mm sessile polyp in the midsigmoid segment with associated diminutive polyps. The patient also had a 1 cm flat polyp opposite the ileocecal valve with a couple of diminutive polyps on the ileocecal valve. The colonic  mucosa was diffusely friable  THERAPEUTIC / DIAGNOSTIC MANEUVERS PERFORMED:  The diminutive polyps in the sigmoid and at the ileocecal valve were ablated with a hot snare loop. The larger sigmoid polyp and the polyp opposite ileocecal valve were removed with hot snare cautery. The polyp opposite the ileocecal valve was removed cleanly after 3 cc of 1-10,000 epinephrine was injected to the base. There was good hemostasis observed after all of the above procedures were performed.  COMPLICATIONS: none apparent  CECAL WITHDRAWAL TIME:  28 minutes  IMPRESSION:  Colonic AVMs. Colonic diverticulosis. Colonic polyps removed and/or treated as described above  RECOMMENDATIONS:  Followup on pathology. Increase dose of Inderal to 40 mg orally twice daily. See EGD report   _______________________________ eSigned:  R. Garfield Cornea, MD FACP [facility] 03/31/2012 10:13 AM   CC:    PATIENT NAME:  Jeffrey [name], Jeffrey [name] MR#: [id number]

## 2012-04-01 ENCOUNTER — Encounter: Payer: Self-pay | Admitting: Gastroenterology

## 2012-04-01 ENCOUNTER — Encounter: Payer: Self-pay | Admitting: Internal Medicine

## 2012-04-04 ENCOUNTER — Other Ambulatory Visit (HOSPITAL_COMMUNITY): Payer: Medicare Other

## 2012-04-04 NOTE — Telephone Encounter (Deleted)
Op note will be available in mychart in 10 days from procedure.

## 2012-04-05 ENCOUNTER — Encounter: Payer: Self-pay | Admitting: *Deleted

## 2012-04-14 ENCOUNTER — Ambulatory Visit (HOSPITAL_COMMUNITY)
Admission: RE | Admit: 2012-04-14 | Discharge: 2012-04-14 | Disposition: A | Discharge status: Home / Self Care | Payer: Medicare Other | Source: Ambulatory Visit | Attending: Gastroenterology | Admitting: Gastroenterology

## 2012-04-14 DIAGNOSIS — R161 Splenomegaly, not elsewhere classified: Secondary | ICD-10-CM | POA: Insufficient documentation

## 2012-04-14 DIAGNOSIS — C220 Liver cell carcinoma: Secondary | ICD-10-CM

## 2012-04-14 DIAGNOSIS — K7460 Unspecified cirrhosis of liver: Secondary | ICD-10-CM | POA: Insufficient documentation

## 2012-04-14 DIAGNOSIS — C228 Malignant neoplasm of liver, primary, unspecified as to type: Secondary | ICD-10-CM | POA: Insufficient documentation

## 2012-04-15 NOTE — Progress Notes (Signed)
Quick Note:  U/S showed cirrhosis. No HCC. Repeat abd u/s, AFP, CMET, INR, CBC in six months. ______

## 2012-04-20 ENCOUNTER — Other Ambulatory Visit: Payer: Self-pay | Admitting: Gastroenterology

## 2012-04-20 DIAGNOSIS — K7460 Unspecified cirrhosis of liver: Secondary | ICD-10-CM

## 2012-05-11 ENCOUNTER — Encounter: Payer: Self-pay | Admitting: Internal Medicine

## 2012-05-12 ENCOUNTER — Ambulatory Visit: Payer: Medicare Other | Admitting: Gastroenterology

## 2012-05-19 ENCOUNTER — Encounter: Payer: Self-pay | Admitting: Urgent Care

## 2012-05-19 ENCOUNTER — Ambulatory Visit (INDEPENDENT_AMBULATORY_CARE_PROVIDER_SITE_OTHER): Payer: Medicare Other | Admitting: Urgent Care

## 2012-05-19 VITALS — BP 146/73 | HR 78 | Ht 67.0 in | Wt 233.6 lb

## 2012-05-19 DIAGNOSIS — K7030 Alcoholic cirrhosis of liver without ascites: Secondary | ICD-10-CM

## 2012-05-19 DIAGNOSIS — D500 Iron deficiency anemia secondary to blood loss (chronic): Secondary | ICD-10-CM

## 2012-05-19 DIAGNOSIS — I8510 Secondary esophageal varices without bleeding: Secondary | ICD-10-CM

## 2012-05-19 LAB — CBC WITH DIFFERENTIAL/PLATELET
Basophils Absolute: 0 10*3/uL (ref 0.0–0.1)
Basophils Relative: 1 % (ref 0–1)
Eosinophils Absolute: 0.1 10*3/uL (ref 0.0–0.7)
Eosinophils Relative: 5 % (ref 0–5)
HCT: 36.6 % — ABNORMAL LOW (ref 39.0–52.0)
Hemoglobin: 11.6 g/dL — ABNORMAL LOW (ref 13.0–17.0)
Lymphocytes Relative: 19 % (ref 12–46)
Lymphs Abs: 0.5 10*3/uL — ABNORMAL LOW (ref 0.7–4.0)
MCH: 28.1 pg (ref 26.0–34.0)
MCHC: 31.7 g/dL (ref 30.0–36.0)
MCV: 88.6 fL (ref 78.0–100.0)
Monocytes Absolute: 0.4 10*3/uL (ref 0.1–1.0)
Monocytes Relative: 13 % — ABNORMAL HIGH (ref 3–12)
Neutro Abs: 1.7 10*3/uL (ref 1.7–7.7)
Neutrophils Relative %: 62 % (ref 43–77)
Platelets: 90 10*3/uL — ABNORMAL LOW (ref 150–400)
RBC: 4.13 MIL/uL — ABNORMAL LOW (ref 4.22–5.81)
RDW: 17 % — ABNORMAL HIGH (ref 11.5–15.5)
WBC: 2.7 10*3/uL — ABNORMAL LOW (ref 4.0–10.5)

## 2012-05-19 LAB — BASIC METABOLIC PANEL
BUN: 9 mg/dL (ref 6–23)
CO2: 28 mEq/L (ref 19–32)
Calcium: 9.9 mg/dL (ref 8.4–10.5)
Chloride: 97 mEq/L (ref 96–112)
Creat: 0.69 mg/dL (ref 0.50–1.35)
Glucose, Bld: 172 mg/dL — ABNORMAL HIGH (ref 70–99)
Potassium: 4.5 mEq/L (ref 3.5–5.3)
Sodium: 136 mEq/L (ref 135–145)

## 2012-05-19 NOTE — Patient Instructions (Addendum)
Continue inderal 60mg  daily Go to lab today to get blood ct checked

## 2012-05-20 ENCOUNTER — Encounter: Payer: Self-pay | Admitting: Urgent Care

## 2012-05-20 NOTE — Progress Notes (Signed)
Faxed to PCP

## 2012-05-20 NOTE — Assessment & Plan Note (Signed)
Chronic alcoholic cirrhosis (abstinent since 2006) with portal gastropathy, esophageal varices and chronic anemia. He feels well.  He is at his baseline.  He is due for abdominal ultrasound and alpha-fetoprotein in April 2014 for screening for [facility] & baseline labs.

## 2012-05-20 NOTE — Assessment & Plan Note (Signed)
More than likely chronic oozing from [place] stomach and possibly multiple gastric polyps. Unfortunately due to his chronic liver disease, multiple gastric polyps cannot be manipulated. No intervention at this time unless he has significant hemorrhage. He does denies any gross GI bleeding. He is due for repeat hemoglobin.  Recheck CBC, CMP

## 2012-05-20 NOTE — Progress Notes (Signed)
Referring Provider: Monico Blitz, MD Primary Care Physician:  Monico Blitz, MD Primary Gastroenterologist:  Dr. Gala Romney  Chief Complaint  Patient presents with  . Follow-up    anemia, alcoholic cirhhosis    HPI:  Jeffrey [name] is a 66 y.o. male here for follow up for anemia and alcoholic cirrhosis. He recent EGD by Dr. Gala Romney and was found to have grade 2 and 3 esophageal varices (4 columns). 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 He colonoscopy at the same time which showed a tubular adenoma of the colon was removed.  He tells me he feels well. Denies abdominal pain, nausea, or vomiting. He denies any significant lower edema or ascites. He presents with his sister. She notes that he is baseline without confusion. He denies any rectal bleeding or melena. He is on Inderal 60 mg.  He has done well with this dose change. He denies any dizziness or shortness of breath.  Past Medical History  Diagnosis Date  . Diabetes   . History of alcohol abuse     quit in 2006  . Hypertension   . Anxiety   . Depression   . Asymptomatic gallstones     Ultrasound in 2006  . Splenomegaly     Ultrasound in 2006  . Alcoholic cirrhosis     patient reports completing hep a and b vaccines in 2006  . Esophageal varices     see PSH  . Anemia     has had 3 units of prbcs 2013  . Duodenal ulcer with hemorrhage     per patient in 2006 or 2007, records have been requested  . Tubular adenoma of colon     Past Surgical History  Procedure Date  . Colonoscopy 03/10/2005    Rectal polyp as described above, removed with snare. Left sided  diverticula. The remainder of the colonic mucosa appeared normal. Inflammed polyp on path.  . Esophagogastroduodenoscopy 01/15/2005    Three columns grade 1 to 2 esophageal varices, otherwise normal esophageal mucosa.  Esophagus was not manipulated otherwise./Nodularity of the antrum with overlying erosions, nonspecific finding. Path showed rare H.pylori  .  Appendectomy   . Esophagogastroduodenoscopy 09/2008    Dr. Gaylene Brooks columns of grade 2-3 esoph varices, only one column was prominent. Portal gastropathy, multiple gastrc polyps at antrum, two were 2cm with black eschar, bulbar polyps, bulbar erosions  . Esophagogastroduodenoscopy 11/2004    Dr. Brantley Stage 3 esoph varices  . Colonoscopy 04/1999    Dr. Thea Silversmith polyps removed,  Path showed hyperplastic  . Colonoscopy with esophagogastroduodenoscopy (egd) 03/31/2012    [id number] AVMs. Colonic diverticulosis. tubular adenoma colon  . Gastric varices banding 03/31/2012    Procedure: GASTRIC VARICES BANDING;  Surgeon: Daneil Dolin, MD;  Location: [facility];  Service: Endoscopy;  Laterality: N/A;  . Esophagogastroduodenoscopy 03/31/2012    RMR: 4 columns(3-GR 2, 1-GR1) non-bleeding esophageal varices, portal gastropathy, small HH, early GAVE, multiple gastric polyps     Current Outpatient Prescriptions  Medication Sig Dispense Refill  . Ascorbic Acid (VITAMIN C) 1000 MG tablet Take 1,000 mg by mouth daily.      . Cinnamon 500 MG capsule Take 500 mg by mouth daily.      . cyclobenzaprine (FLEXERIL) 10 MG tablet Take 10 mg by mouth daily.       [name] FLUoxetine (PROZAC) 20 MG capsule Take 20 mg by mouth daily.       . furosemide (LASIX) 20 MG tablet Take 20 mg by mouth  daily.       . glipiZIDE (GLUCOTROL) 10 MG tablet Take 10 mg by mouth 2 (two) times daily before a meal.       . LEVEMIR FLEXPEN 100 UNIT/ML injection Inject 80 Units into the skin at bedtime.       . metFORMIN (GLUCOPHAGE) 1000 MG tablet Take 1,000 mg by mouth 2 (two) times daily with a meal.       . milk thistle 175 MG tablet Take 175 mg by mouth daily.      . Multiple Vitamin (MULTIVITAMIN) capsule Take 1 capsule by mouth daily.      [name] NOVOLOG FLEXPEN 100 UNIT/ML injection Inject 30 Units into the skin every evening.       . propranolol (INDERAL) 20 MG tablet Take 60 mg by mouth daily.       . ranitidine  (ZANTAC) 150 MG tablet Take 150 mg by mouth 2 (two) times daily.       [name] spironolactone (ALDACTONE) 25 MG tablet Take 25 mg by mouth daily.       . Thiamine HCl (VITAMIN B-1) 250 MG tablet Take 250 mg by mouth daily.        Allergies as of 05/19/2012  . (No Known Allergies)    Review of Systems: See history of present illness, otherwise negative review of systems  Physical Exam: BP 146/73  Pulse 78  Ht 5\' 7"  (1.702 m)  Wt 233 lb 9.6 oz (105.96 kg)  BMI 36.59 kg/m2 General:   Alert,  obese, pleasant and cooperative in NAD. He is accompanied by his sister today. Eyes:  Sclera clear, no icterus.   Conjunctiva pink. Mouth:  No deformity or lesions, oropharynx pink and moist. Neck:  Supple; no masses or thyromegaly. Heart:  Regular rate and rhythm; no murmurs, clicks, rubs,  or gallops. Abdomen:  Normal bowel sounds.  No bruits.  Soft, non-tender and non-distended without masses, hepatosplenomegaly or hernias noted.  No tense ascites. No guarding or rebound tenderness.   Rectal:  Deferred. Msk:  Symmetrical without gross deformities.  Pulses:  Normal pulses noted. Extremities: + Clubbing, mild trace ankle edema.  No asterixis. Neurologic:  Alert and oriented x4;  grossly normal neurologically. Skin:  Intact without significant lesions or rashes.

## 2012-05-23 NOTE — Progress Notes (Signed)
Quick Note:  Please call pt. His blood count is overall much better, however his platelets are a bit low most likely due to his liver disease. He should also discuss his high blood sugars with SHAH,ASHISH, MD. Keep follow up as planned. Thanks [id number], MD   ______

## 2012-05-23 NOTE — Progress Notes (Signed)
Quick Note:  Tried to call pt- LMOM ______ 

## 2012-05-24 NOTE — Progress Notes (Signed)
Quick Note:  Tried to call pt- LMOM ______ 

## 2012-05-27 NOTE — Progress Notes (Signed)
Quick Note:  Ok, let's try one more time, then send letter. Thanks ______

## 2012-05-31 NOTE — Progress Notes (Signed)
Quick Note:  Unable to reach pt- mailed letter. Dawn, please cc pcp ______

## 2012-05-31 NOTE — Progress Notes (Signed)
Faxed to PCP

## 2012-07-04 ENCOUNTER — Other Ambulatory Visit: Payer: Self-pay

## 2012-07-04 ENCOUNTER — Other Ambulatory Visit: Payer: Self-pay | Admitting: Urgent Care

## 2012-07-04 MED ORDER — PROPRANOLOL HCL 20 MG PO TABS: 20.0000 mg | ORAL_TABLET | Freq: Three times a day (TID) | ORAL | Status: DC

## 2012-07-04 MED ORDER — PROPRANOLOL HCL 20 MG PO TABS: 40.0000 mg | ORAL_TABLET | Freq: Three times a day (TID) | ORAL | Status: DC

## 2012-07-04 NOTE — Progress Notes (Signed)
Spoke w/ Cassie at [facility] to clarify pt's dose.  RX for 40mg  Inderal cancelled, pt should be on 60mg  daily (20mg  TID). New order given.

## 2012-07-16 ENCOUNTER — Other Ambulatory Visit: Payer: Self-pay

## 2012-08-15 ENCOUNTER — Other Ambulatory Visit: Payer: Self-pay

## 2012-08-15 DIAGNOSIS — D500 Iron deficiency anemia secondary to blood loss (chronic): Secondary | ICD-10-CM

## 2012-08-15 DIAGNOSIS — K7030 Alcoholic cirrhosis of liver without ascites: Secondary | ICD-10-CM

## 2012-09-01 LAB — COMPREHENSIVE METABOLIC PANEL
ALT: 36 U/L (ref 0–53)
AST: 43 U/L — ABNORMAL HIGH (ref 0–37)
Albumin: 4.3 g/dL (ref 3.5–5.2)
Alkaline Phosphatase: 63 U/L (ref 39–117)
BUN: 13 mg/dL (ref 6–23)
CO2: 25 mEq/L (ref 19–32)
Calcium: 9 mg/dL (ref 8.4–10.5)
Chloride: 82 mEq/L — ABNORMAL LOW (ref 96–112)
Creat: 0.96 mg/dL (ref 0.50–1.35)
Glucose, Bld: 194 mg/dL — ABNORMAL HIGH (ref 70–99)
Potassium: 5.9 mEq/L — ABNORMAL HIGH (ref 3.5–5.3)
Sodium: 116 mEq/L — ABNORMAL LOW (ref 135–145)
Total Bilirubin: 1.7 mg/dL — ABNORMAL HIGH (ref 0.3–1.2)
Total Protein: 6.5 g/dL (ref 6.0–8.3)

## 2012-09-01 LAB — CBC WITH DIFFERENTIAL/PLATELET
Basophils Absolute: 0 10*3/uL (ref 0.0–0.1)
Basophils Relative: 1 % (ref 0–1)
Eosinophils Absolute: 0.1 10*3/uL (ref 0.0–0.7)
Eosinophils Relative: 2 % (ref 0–5)
HCT: 31.5 % — ABNORMAL LOW (ref 39.0–52.0)
Hemoglobin: 10.8 g/dL — ABNORMAL LOW (ref 13.0–17.0)
Lymphocytes Relative: 7 % — ABNORMAL LOW (ref 12–46)
Lymphs Abs: 0.2 10*3/uL — ABNORMAL LOW (ref 0.7–4.0)
MCH: 32 pg (ref 26.0–34.0)
MCHC: 34.3 g/dL (ref 30.0–36.0)
MCV: 93.2 fL (ref 78.0–100.0)
Monocytes Absolute: 0.2 10*3/uL (ref 0.1–1.0)
Monocytes Relative: 5 % (ref 3–12)
Neutro Abs: 2.9 10*3/uL (ref 1.7–7.7)
Neutrophils Relative %: 85 % — ABNORMAL HIGH (ref 43–77)
Platelets: 94 10*3/uL — ABNORMAL LOW (ref 150–400)
RBC: 3.38 MIL/uL — ABNORMAL LOW (ref 4.22–5.81)
RDW: 15.6 % — ABNORMAL HIGH (ref 11.5–15.5)
WBC: 3.4 10*3/uL — ABNORMAL LOW (ref 4.0–10.5)

## 2012-09-01 LAB — PROTIME-INR
INR: 1.09 (ref <1.50)
Prothrombin Time: 14.1 seconds (ref 11.6–15.2)

## 2012-09-02 ENCOUNTER — Other Ambulatory Visit: Payer: Self-pay

## 2012-09-02 ENCOUNTER — Telehealth: Payer: Self-pay

## 2012-09-02 DIAGNOSIS — E875 Hyperkalemia: Secondary | ICD-10-CM

## 2012-09-02 DIAGNOSIS — IMO0001 Reserved for inherently not codable concepts without codable children: Secondary | ICD-10-CM

## 2012-09-02 LAB — AFP TUMOR MARKER: AFP-Tumor Marker: <1.3 ng/mL (ref 0.0–8.0)

## 2012-09-02 NOTE — Telephone Encounter (Signed)
BMET reviewed- Na too low and potassium too high. Recommend stop both aldactone and lasix immediately; continue w a salt restricted diet; need BMET April 7th; urgent ov w extender Monday or Tuesday.  I called and informed pt. He will do STAT BMET early mon 09/05/2012 and see Laban Emperor, NP for OV at 10:00 AM.

## 2012-09-05 ENCOUNTER — Encounter: Payer: Self-pay | Admitting: Gastroenterology

## 2012-09-05 ENCOUNTER — Ambulatory Visit (INDEPENDENT_AMBULATORY_CARE_PROVIDER_SITE_OTHER): Payer: Medicare Other | Admitting: Gastroenterology

## 2012-09-05 ENCOUNTER — Encounter: Payer: Self-pay | Admitting: Urgent Care

## 2012-09-05 VITALS — BP 142/71 | HR 76 | Temp 97.4°F | Ht 67.0 in | Wt 231.0 lb

## 2012-09-05 DIAGNOSIS — K7460 Unspecified cirrhosis of liver: Secondary | ICD-10-CM | POA: Insufficient documentation

## 2012-09-05 LAB — BASIC METABOLIC PANEL
BUN: 12 mg/dL (ref 6–23)
CO2: 24 mEq/L (ref 19–32)
Calcium: 9 mg/dL (ref 8.4–10.5)
Chloride: 88 mEq/L — ABNORMAL LOW (ref 96–112)
Creat: 0.82 mg/dL (ref 0.50–1.35)
Glucose, Bld: 174 mg/dL — ABNORMAL HIGH (ref 70–99)
Potassium: 5.2 mEq/L (ref 3.5–5.3)
Sodium: 124 mEq/L — ABNORMAL LOW (ref 135–145)

## 2012-09-05 MED ORDER — OMEPRAZOLE 20 MG PO CPDR: 20.0000 mg | DELAYED_RELEASE_CAPSULE | Freq: Every day | ORAL | Status: DC

## 2012-09-05 NOTE — Patient Instructions (Addendum)
We have scheduled you for an ultrasound of your belly.  Please have blood work done this Friday, April 11th. You will also need blood work again in 6 weeks. We will send a letter to remind you.   You will see Dr. Gala Romney in 3 months.  Start taking Prilosec each morning, 30 minutes before breakfast. Stop Zantac.   Do not eat 2 bags of ice a day. Limit to 2 cups of ice daily. Limit fluids to 1.5 liters daily.

## 2012-09-05 NOTE — Progress Notes (Signed)
Cc PCP 

## 2012-09-05 NOTE — Assessment & Plan Note (Addendum)
67 year old male with history of ETOH cirrhosis and recent hyperkalemia and hyponatremia in the setting of low-dose Lasix and Aldactone. This has been stopped with improvement in sodium and normalization of potassium. He also notes chewing large amounts of ice daily, at least 2 large bags. Discussed fluid restriction, cessation of diuretics for now, and rechecking BMP again on Friday. No lower extremity edema or ascites on exam.  Hx of anemia, with TCS and EGD on file a few months ago. Mild drift in Hgb noted but no signs of overt GI bleeding. Will redraw in 6 weeks. Start PPI daily, continue Inderal as previously prescribed. Nausea improving, may have been secondary to electrolyte disturbances. However, if continues, may need further work-up (GES?). Follow-up in 3 months, repeat BMP in a few days, CBC in 6 weeks. Fluid restriction discussed.

## 2012-09-05 NOTE — Progress Notes (Signed)
Referring Provider: Monico Blitz, MD Primary Care Physician:  Monico Blitz, MD Primary GI: Dr. Gala Romney   Chief Complaint  Patient presents with  . Follow-up    HPI:   Jeffrey [name] is a 67 year old male with a history of ETOH cirrhosis and anemia. Grade 2 and 3 esophageal varices on recent EGD Oct 2013. Recent CMP noted hyponatremia with Na at 116, hyperkalemia K 5.9. This was in the presence of Lasix and Aldactone. This was stopped, and repeat BMP with normalized potassium, improved Na to 124. Pt notes he is chewing on at least 2 full bags of ice per day.  Denies mental status changes, confusion. No added salt. No abdominal pain, rectal bleeding. States was nauseated but now improving. BM every morning. Not able to eat much in the last week or 2. States nausea present for about a week or so but improving. Not on a PPI. Taking Zantac BID.   Past Medical History  Diagnosis Date  . Diabetes   . History of alcohol abuse     quit in 2006  . Hypertension   . Anxiety   . Depression   . Asymptomatic gallstones     Ultrasound in 2006  . Splenomegaly     Ultrasound in 2006  . Alcoholic cirrhosis     patient reports completing hep a and b vaccines in 2006  . Esophageal varices     see PSH  . Anemia     has had 3 units of prbcs 2013  . Duodenal ulcer with hemorrhage     per patient in 2006 or 2007, records have been requested  . Tubular adenoma of colon     Past Surgical History  Procedure Laterality Date  . Colonoscopy  03/10/2005    Rectal polyp as described above, removed with snare. Left sided  diverticula. The remainder of the colonic mucosa appeared normal. Inflammed polyp on path.  . Esophagogastroduodenoscopy  01/15/2005    Three columns grade 1 to 2 esophageal varices, otherwise normal esophageal mucosa.  Esophagus was not manipulated otherwise./Nodularity of the antrum with overlying erosions, nonspecific finding. Path showed rare H.pylori  . Appendectomy    .  Esophagogastroduodenoscopy  09/2008    Dr. Gaylene Brooks columns of grade 2-3 esoph varices, only one column was prominent. Portal gastropathy, multiple gastrc polyps at antrum, two were 2cm with black eschar, bulbar polyps, bulbar erosions  . Esophagogastroduodenoscopy  11/2004    Dr. Brantley Stage 3 esoph varices  . Colonoscopy  04/1999    Dr. Thea Silversmith polyps removed,  Path showed hyperplastic  . Colonoscopy with esophagogastroduodenoscopy (egd)  03/31/2012    [id number] AVMs. Colonic diverticulosis. tubular adenoma colon  . Gastric varices banding  03/31/2012    Procedure: GASTRIC VARICES BANDING;  Surgeon: Daneil Dolin, MD;  [facility]: [facility];  Service: Endoscopy;  Laterality: N/A;  . Esophagogastroduodenoscopy  03/31/2012    RMR: 4 columns(3-GR 2, 1-GR1) non-bleeding esophageal varices, portal gastropathy, small HH, early GAVE, multiple gastric polyps     Current Outpatient Prescriptions  Medication Sig Dispense Refill  . Ascorbic Acid (VITAMIN C) 1000 MG tablet Take 1,000 mg by mouth daily.      . Cinnamon 500 MG capsule Take 500 mg by mouth daily.      . cyclobenzaprine (FLEXERIL) 10 MG tablet Take 10 mg by mouth daily.       [name] FLUoxetine (PROZAC) 20 MG capsule Take 20 mg by mouth daily.       [name] glipiZIDE (GLUCOTROL) 10 MG  tablet Take 10 mg by mouth 2 (two) times daily before a meal.       . JANUVIA 100 MG tablet Take 100 mg by mouth daily.       [name] LEVEMIR FLEXPEN 100 UNIT/ML injection Inject 80 Units into the skin at bedtime.       . metFORMIN (GLUCOPHAGE) 1000 MG tablet Take 1,000 mg by mouth 2 (two) times daily with a meal.       . milk thistle 175 MG tablet Take 175 mg by mouth daily.      . Multiple Vitamin (MULTIVITAMIN) capsule Take 1 capsule by mouth daily.      [name] NOVOLOG FLEXPEN 100 UNIT/ML injection Inject 30 Units into the skin every evening.       . propranolol (INDERAL) 20 MG tablet Take 1 tablet (20 mg total) by mouth 3 (three) times daily.  90 tablet   5  . ranitidine (ZANTAC) 150 MG tablet Take 150 mg by mouth 2 (two) times daily.       . Thiamine HCl (VITAMIN B-1) 250 MG tablet Take 250 mg by mouth daily.       No current facility-administered medications for this visit.    Allergies as of 09/05/2012  . (No Known Allergies)    Family History  Problem Relation Age of Onset  . Colon cancer Father 42    still living, age 5  . Breast cancer Sister     deceased  . Breast cancer Sister   . Lung cancer Neg Hx   . Ovarian cancer Neg Hx     History   Social History  . Marital Status: Single    Spouse Name: N/A    Number of Children: 2  . Years of Education: N/A   Occupational History  . Disabled    Social History Main Topics  . Smoking status: Former Smoker -- 0.50 packs/day    Types: Cigarettes  . Smokeless tobacco: None     Comment: qiut about 25+ years  . Alcohol Use: No     Comment: quit in 2006  . Drug Use: No  . Sexually Active: None   Other Topics Concern  . None   Social History Narrative   Two step children from second marriage (divorced) who live with him along with some grandchildren.     Review of Systems: Negative unless mentioned in HPI.   Physical Exam: BP 142/71  Pulse 76  Temp(Src) 97.4 F (36.3 C) (Oral)  Ht 5\' 7"  (1.702 m)  Wt 231 lb (104.781 kg)  BMI 36.17 kg/m2 General:   Alert and oriented. No distress noted. Pleasant and cooperative.  Head:  Normocephalic and atraumatic. Eyes:  Conjuctiva clear without scleral icterus. Mouth:  Oral mucosa pink and moist. Good dentition. No lesions. Neck:  Supple, without mass or thyromegaly. Heart:  S1, S2 present without murmurs, rubs, or gallops. Regular rate and rhythm. Abdomen:  +BS, soft, non-tender and non-distended. No rebound or guarding. Mild HSM, +umbilical hernia.  Msk:  Symmetrical without gross deformities. Normal posture. Extremities:  Without edema. Neurologic:  Alert and  oriented x4;  grossly normal neurologically. Skin:   Intact without significant lesions or rashes. Cervical Nodes:  No significant cervical adenopathy. Psych:  Alert and cooperative. Normal mood and affect.

## 2012-09-06 ENCOUNTER — Other Ambulatory Visit: Payer: Self-pay | Admitting: Gastroenterology

## 2012-09-06 ENCOUNTER — Telehealth: Payer: Self-pay | Admitting: Internal Medicine

## 2012-09-06 DIAGNOSIS — K7460 Unspecified cirrhosis of liver: Secondary | ICD-10-CM

## 2012-09-06 NOTE — Telephone Encounter (Signed)
Pt's wife called to say that patient will be having the ultrasound that Dr Manuella Ghazi had ordered to be done on Monday next week and will have blood work done there as well since it will be closer to home for them and have all the results sent to [place]. She said that patient was scheduled for ultrasound at [facility] for Thursday and we could cancel that since Dr Manuella Ghazi had ordered the same thing.

## 2012-09-06 NOTE — Telephone Encounter (Signed)
Jeffrey [name], can we have the lab orders for the [facility] for Friday and the CBC for 6 weeks from now faxed to Dr. Trena Platt office?  Jeffrey [name], is there a way he can get the US done there as well?

## 2012-09-06 NOTE — Telephone Encounter (Signed)
Done

## 2012-09-08 ENCOUNTER — Ambulatory Visit (HOSPITAL_COMMUNITY): Payer: Medicare Other

## 2012-09-08 ENCOUNTER — Other Ambulatory Visit: Payer: Self-pay | Admitting: Gastroenterology

## 2012-09-08 DIAGNOSIS — K7460 Unspecified cirrhosis of liver: Secondary | ICD-10-CM

## 2012-09-12 ENCOUNTER — Other Ambulatory Visit: Payer: Self-pay

## 2012-09-12 DIAGNOSIS — K7460 Unspecified cirrhosis of liver: Secondary | ICD-10-CM

## 2012-10-25 ENCOUNTER — Telehealth: Payer: Self-pay | Admitting: General Practice

## 2012-10-25 NOTE — Telephone Encounter (Signed)
U/S done at Dr. Janyth Pupa office 08/2012

## 2012-10-25 NOTE — Telephone Encounter (Signed)
REPEAT ABD U/S FROM MAY RECALL LIST

## 2013-01-04 ENCOUNTER — Other Ambulatory Visit: Payer: Self-pay

## 2013-01-08 ENCOUNTER — Other Ambulatory Visit: Payer: Self-pay | Admitting: Gastroenterology

## 2013-02-20 ENCOUNTER — Other Ambulatory Visit: Payer: Self-pay | Admitting: Urgent Care

## 2013-04-06 ENCOUNTER — Other Ambulatory Visit: Payer: Self-pay

## 2013-05-07 ENCOUNTER — Other Ambulatory Visit: Payer: Self-pay | Admitting: Gastroenterology

## 2013-09-11 ENCOUNTER — Other Ambulatory Visit: Payer: Self-pay | Admitting: Gastroenterology

## 2013-09-11 ENCOUNTER — Encounter: Payer: Self-pay | Admitting: Internal Medicine

## 2013-09-11 ENCOUNTER — Other Ambulatory Visit: Payer: Self-pay

## 2013-09-11 NOTE — Telephone Encounter (Signed)
Needs OV for follow up cirrhosis and anemia. RX done.

## 2013-09-11 NOTE — Telephone Encounter (Signed)
Pt is aware of OV on 5/11 at 11 with [facility] and appt card mailed

## 2013-10-09 ENCOUNTER — Ambulatory Visit: Payer: Medicare Other | Admitting: Gastroenterology

## 2013-12-13 ENCOUNTER — Other Ambulatory Visit: Payer: Self-pay | Admitting: Gastroenterology

## 2014-04-12 ENCOUNTER — Other Ambulatory Visit: Payer: Self-pay | Admitting: Gastroenterology

## 2014-04-13 NOTE — Telephone Encounter (Signed)
Well over due for f/u of cirrhosis. Please make nonurgent OV. RX with one RF sent.

## 2014-05-14 ENCOUNTER — Other Ambulatory Visit: Payer: Self-pay | Admitting: Gastroenterology

## 2014-05-16 NOTE — Telephone Encounter (Signed)
I'm sending in a refill for 6 momths. He'll need a follow-up appointment after to check on him after that because he hasn't been seen in a year and a half.

## 2014-05-16 NOTE — Telephone Encounter (Signed)
PUT ON RECALL LIST FOR 4 MONTH'S

## 2014-06-11 ENCOUNTER — Other Ambulatory Visit: Payer: Self-pay | Admitting: Gastroenterology

## 2014-10-09 ENCOUNTER — Other Ambulatory Visit: Payer: Self-pay | Admitting: Gastroenterology

## 2014-10-09 NOTE — Telephone Encounter (Signed)
Patient needs follow up in the next 4-6 weeks. Last seen in 2014. Refill for inderal with couple of refills given but needs follow up of cirrhosis.

## 2015-02-06 ENCOUNTER — Other Ambulatory Visit: Payer: Self-pay | Admitting: Gastroenterology

## 2015-02-06 NOTE — Telephone Encounter (Signed)
Please call patient and notify him he needs to be seen. We have not seen him in over 2 years for his routine cirrhosis care. I have sent in a refill of his inderall to last a couple months until he can be seen.

## 2015-04-15 ENCOUNTER — Encounter: Payer: Self-pay | Admitting: Internal Medicine

## 2015-04-24 ENCOUNTER — Encounter: Payer: Self-pay | Admitting: Nurse Practitioner

## 2015-04-24 ENCOUNTER — Ambulatory Visit (INDEPENDENT_AMBULATORY_CARE_PROVIDER_SITE_OTHER): Payer: Medicare Other | Admitting: Nurse Practitioner

## 2015-04-24 VITALS — BP 141/71 | HR 82 | Temp 96.9°F | Ht 67.0 in | Wt 239.0 lb

## 2015-04-24 DIAGNOSIS — D500 Iron deficiency anemia secondary to blood loss (chronic): Secondary | ICD-10-CM | POA: Diagnosis not present

## 2015-04-24 DIAGNOSIS — K7030 Alcoholic cirrhosis of liver without ascites: Secondary | ICD-10-CM

## 2015-04-24 DIAGNOSIS — I8510 Secondary esophageal varices without bleeding: Secondary | ICD-10-CM | POA: Diagnosis not present

## 2015-04-24 LAB — AMMONIA: Ammonia: 58 umol/L — ABNORMAL HIGH (ref 16–53)

## 2015-04-24 LAB — COMPREHENSIVE METABOLIC PANEL
ALT: 30 U/L (ref 9–46)
AST: 31 U/L (ref 10–35)
Albumin: 3.8 g/dL (ref 3.6–5.1)
Alkaline Phosphatase: 123 U/L — ABNORMAL HIGH (ref 40–115)
BUN: 13 mg/dL (ref 7–25)
CO2: 28 mmol/L (ref 20–31)
Calcium: 9.8 mg/dL (ref 8.6–10.3)
Chloride: 102 mmol/L (ref 98–110)
Creat: 0.91 mg/dL (ref 0.70–1.25)
Glucose, Bld: 185 mg/dL — ABNORMAL HIGH (ref 65–99)
Potassium: 4.2 mmol/L (ref 3.5–5.3)
Sodium: 132 mmol/L — ABNORMAL LOW (ref 135–146)
Total Bilirubin: 0.7 mg/dL (ref 0.2–1.2)
Total Protein: 6.9 g/dL (ref 6.1–8.1)

## 2015-04-24 LAB — CBC WITH DIFFERENTIAL/PLATELET
Basophils Absolute: 0 10*3/uL (ref 0.0–0.1)
Basophils Relative: 1 % (ref 0–1)
Eosinophils Absolute: 0.2 10*3/uL (ref 0.0–0.7)
Eosinophils Relative: 5 % (ref 0–5)
HCT: 29.3 % — ABNORMAL LOW (ref 39.0–52.0)
Hemoglobin: 9.4 g/dL — ABNORMAL LOW (ref 13.0–17.0)
Lymphocytes Relative: 12 % (ref 12–46)
Lymphs Abs: 0.4 10*3/uL — ABNORMAL LOW (ref 0.7–4.0)
MCH: 29.9 pg (ref 26.0–34.0)
MCHC: 32.1 g/dL (ref 30.0–36.0)
MCV: 93.3 fL (ref 78.0–100.0)
MPV: 9.4 fL (ref 8.6–12.4)
Monocytes Absolute: 0.3 10*3/uL (ref 0.1–1.0)
Monocytes Relative: 9 % (ref 3–12)
Neutro Abs: 2.4 10*3/uL (ref 1.7–7.7)
Neutrophils Relative %: 73 % (ref 43–77)
Platelets: 179 10*3/uL (ref 150–400)
RBC: 3.14 MIL/uL — ABNORMAL LOW (ref 4.22–5.81)
RDW: 16.7 % — ABNORMAL HIGH (ref 11.5–15.5)
WBC: 3.3 10*3/uL — ABNORMAL LOW (ref 4.0–10.5)

## 2015-04-24 LAB — PROTIME-INR
INR: 1.1 (ref <1.50)
Prothrombin Time: 14.3 seconds (ref 11.6–15.2)

## 2015-04-24 MED ORDER — PROPRANOLOL HCL 40 MG PO TABS: 40.0000 mg | ORAL_TABLET | Freq: Two times a day (BID) | ORAL | Status: DC

## 2015-04-24 NOTE — Patient Instructions (Addendum)
1. We will have the sign a release of records so we can get your records from Dr. Britta Mccreedy and from year recent hospitalization at [facility]. 2. Have your labs drawn when you're able to. 3. Have your ultrasound done we are able to. 4. Return for follow-up in 3 months. 5. Increase her heart rate medication (Inderal) to 40 mg twice a day.

## 2015-04-24 NOTE — Assessment & Plan Note (Signed)
Patient with a history of alcoholic cirrhosis currently not drinking. Last seen by our office in 2014 and has been seeing Dr. Britta Mccreedy in [place] at [place]. We will request records from Dr. Britta Mccreedy as well as from his recent hospitalization for GI bleed requiring 4 units of PRBC transfusion at [facility]. Today we will order routine labs and imaging including CBC, CMP, PTT/INR, AFP. I will also order an ammonia level as she's had difficulties with this in the past per records reviewed. Order right upper quadrant ultrasound to assess for hepatoma surveillance. He is currently taking 20 mg of Inderal 3 times a day and at last endoscopy by [place] he was recommended to be on 40 mg twice daily. His heart rate today is 82 and his blood pressure is 141/71. His blood pressure control or increased dose of Inderal so we'll increase this to 40 mg twice daily. Return for follow-up in 3 months.

## 2015-04-24 NOTE — Progress Notes (Signed)
Referring Provider: Monico Blitz, MD Primary Care Physician:  Monico Blitz, MD Primary GI: Dr. Gala Romney  Chief Complaint  Patient presents with  . Cirrhosis    HPI:   69 year old male presents for follow-up on cirrhosis. Last seen by our office 09/05/2012. Colonoscopy up-to-date completed on 03/31/2012 with noted colonic AVMs, diverticulosis, colon polyps removed and found to be tubular adenoma. Recommended 5 year repeat which would make him do in October 2018. Last EGD completed by our practice 03/31/2012 which found 4 columns of esophageal varices without stigmata of bleeding, 3 columns of grade 2 and one column of grade 1. Also noted portal gastropathy, small hiatal hernia, ulcerated gastric polyps, early GAVE. Noted heme positive stools likely from gastric mucosal oozing. Heart rate not at target on Inderal was recommended to increase to 40 mg twice daily. Unsure if he had been previously seen Dr. Britta Mccreedy since he last saw [place] although this is possible given the PCPs note. PCP notes reviewed as well.  Currently he appears to be on Inderal 20 mg 3 times a day, Lasix 40 mg daily, Aldactone 25 mg daily, and omeprazole 20 mg daily. Labs included with notes drawn 04/12/2015 show continued anemia with hemoglobin 9.3, low platelets at 110, normal serum electrolytes on a basic metabolic profile, no liver function testing done. Ammonia noted normal at 70.  Today he states he has been seeing Dr. Britta Mccreedy in [place] who is retiring. Has been seen every 6-8 months. He had increased Ammonia at one point and was placed on lactulose which he states has worked well, having 2 BMs a day. Last saw Dr. Britta Mccreedy for routine care a few months ago. Was recently in the hospital for GI bleed although he is unable to remember where exactly; Required 4 units PRBCs. Occasional/rare RUQ pain with known gallstones worse with greasy food. Denies N/V, hematochezia, melena, yellowing of skin/eyes, episodes of confusion. Denies chest  pain, dyspnea, dizziness, lightheadedness, syncope, near syncope. Denies any other upper or lower GI symptoms.  Past Medical History  Diagnosis Date  . Diabetes ([place])   . History of alcohol abuse     quit in 2006  . Hypertension   . Anxiety   . Depression   . Asymptomatic gallstones     Ultrasound in 2006  . Splenomegaly     Ultrasound in 2006  . Alcoholic cirrhosis ([place])     patient reports completing hep a and b vaccines in 2006  . 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 Anemia     has had 3 units of prbcs 2013  . Duodenal ulcer with hemorrhage     per patient in 2006 or 2007, records have been requested  . Tubular adenoma of colon     Past Surgical History  Procedure Laterality Date  . Colonoscopy  03/10/2005    Rectal polyp as described above, removed with snare. Left sided  diverticula. The remainder of the colonic mucosa appeared normal. Inflammed polyp on path.  . Esophagogastroduodenoscopy  01/15/2005    Three columns grade 1 to 2 esophageal varices, otherwise normal esophageal mucosa.  Esophagus was not manipulated otherwise./Nodularity of the antrum with overlying erosions, nonspecific finding. Path showed rare H.pylori  . Appendectomy    . Esophagogastroduodenoscopy  09/2008    Dr. Gaylene Brooks columns of grade 2-3 esoph varices, only one column was prominent. Portal gastropathy, multiple gastrc polyps at antrum, two were 2cm with black eschar, bulbar polyps, bulbar erosions  . Esophagogastroduodenoscopy  11/2004    Dr. Brantley Stage 3 esoph varices  . Colonoscopy  04/1999    Dr. Thea Silversmith polyps removed,  Path showed hyperplastic  . Colonoscopy with esophagogastroduodenoscopy (egd)  03/31/2012    [id number] AVMs. Colonic diverticulosis. tubular adenoma colon  . Gastric varices banding  03/31/2012    Procedure: GASTRIC VARICES BANDING;  Surgeon: Daneil Dolin, MD;  Location: [facility];  Service: Endoscopy;  Laterality: N/A;  .  Esophagogastroduodenoscopy  03/31/2012    RMR: 4 columns(3-GR 2, 1-GR1) non-bleeding esophageal varices, portal gastropathy, small HH, early GAVE, multiple gastric polyps     Current Outpatient Prescriptions  Medication Sig Dispense Refill  . ALPHA LIPOIC ACID PO Take by mouth.    . Ascorbic Acid (VITAMIN C) 1000 MG tablet Take 1,000 mg by mouth daily.    . Cholecalciferol (VITAMIN D-3 PO) Take by mouth.    . Chromium Picolinate (CHROMIUM PICOLATE) 1000 MCG TABS Take by mouth.    . Cinnamon 500 MG capsule Take 500 mg by mouth daily.    . Cyanocobalamin (VITAMIN B 12 PO) Take by mouth.    . Flaxseed, Linseed, (FLAX SEED OIL) 1300 MG CAPS Take by mouth.    . furosemide (LASIX) 40 MG tablet Take 40 mg by mouth.    . gabapentin (NEURONTIN) 100 MG capsule Take 100 mg by mouth 3 (three) times daily.    . Garlic 123XX123 MG CAPS Take by mouth.    [name] glipiZIDE (GLUCOTROL) 10 MG tablet Take 10 mg by mouth 2 (two) times daily before a meal.     . HYDROcodone-acetaminophen (NORCO) 10-325 MG tablet Take 1 tablet by mouth every 6 (six) hours as needed.    . iron polysaccharides (NIFEREX) 150 MG capsule Take 150 mg by mouth daily.    [name] JANUVIA 100 MG tablet Take 100 mg by mouth daily.     [name] LEVEMIR FLEXPEN 100 UNIT/ML injection Inject 80 Units into the skin at bedtime.     [name] lisinopril (PRINIVIL,ZESTRIL) 10 MG tablet Take 10 mg by mouth daily.    . Lutein 10 MG TABS Take by mouth.    . metFORMIN (GLUCOPHAGE) 1000 MG tablet Take 1,000 mg by mouth 2 (two) times daily with a meal.     . milk thistle 175 MG tablet Take 175 mg by mouth daily.    . Multiple Vitamin (MULTIVITAMIN) capsule Take 1 capsule by mouth daily.    [name] NOVOLOG FLEXPEN 100 UNIT/ML injection Inject 30 Units into the skin every evening.     [name] omeprazole (PRILOSEC) 20 MG capsule TAKE 1 CAPSULE BY MOUTH EVERY DAY (Patient taking differently: TAKE 1 CAPSULE BY MOUTH EVERY DAY 40 mg) 30 capsule 11  . pravastatin (PRAVACHOL) 20 MG tablet Take 20 mg  by mouth daily.    . propranolol (INDERAL) 20 MG tablet TAKE 1 TABLET BY MOUTH THREE TIMES DAILY 90 tablet 2  . Pseudoephedrine HCl (BL 12 HOUR COLD PO) Take by mouth.    . spironolactone (ALDACTONE) 25 MG tablet Take 25 mg by mouth daily.    . tamsulosin (FLOMAX) 0.4 MG CAPS capsule Take 0.4 mg by mouth.    . triamcinolone cream (KENALOG) 0.1 % Apply 1 application topically 2 (two) times daily.    [name] venlafaxine (EFFEXOR) 37.5 MG tablet Take 37.5 mg by mouth 2 (two) times daily.    . vitamin E 400 UNIT capsule Take 400 Units by mouth daily.    . cyclobenzaprine (FLEXERIL) 10 MG tablet Take  10 mg by mouth daily.     [name] FLUoxetine (PROZAC) 20 MG capsule Take 20 mg by mouth daily.     . ranitidine (ZANTAC) 150 MG tablet Take 150 mg by mouth 2 (two) times daily.     . Thiamine HCl (VITAMIN B-1) 250 MG tablet Take 250 mg by mouth daily.     No current facility-administered medications for this visit.    Allergies as of 04/24/2015  . (No Known Allergies)    Family History  Problem Relation Age of Onset  . Colon cancer Father 18    still living, age 8  . Breast cancer Sister     deceased  . Breast cancer Sister   . Lung cancer Neg Hx   . Ovarian cancer Neg Hx     Social History   Social History  . Marital Status: Single    Spouse Name: N/A  . Number of Children: 2  . Years of Education: N/A   Occupational History  . Disabled    Social History Main Topics  . Smoking status: Former Smoker -- 0.50 packs/day    Types: Cigarettes  . Smokeless tobacco: None     Comment: qiut about 25+ years  . Alcohol Use: No     Comment: quit in 2006  . Drug Use: No  . Sexual Activity: Not Asked   Other Topics Concern  . None   Social History Narrative   Two step children from second marriage (divorced) who live with him along with some grandchildren.     Review of Systems: General: Negative for anorexia, weight loss, fever, chills, fatigue, weakness. Eyes: Negative for vision  changes.  ENT: Negative for hoarseness, difficulty swallowing. CV: Negative for chest pain, angina, palpitations, peripheral edema.  Respiratory: Negative for dyspnea at rest, cough, sputum, wheezing.  GI: See history of present illness. Derm: Negative for rash or itching.  Endo: Negative for unusual weight change.  Heme: Negative for bruising or bleeding. Allergy: Negative for rash or hives.   Physical Exam: BP 141/71 mmHg  Pulse 82  Temp(Src) 96.9 F (36.1 C)  Ht 5\' 7"  (1.702 m)  Wt 239 lb (108.41 kg)  BMI 37.42 kg/m2 General:   Morbidly obese male, Alert and oriented. Pleasant and cooperative. Well-nourished and well-developed.  Head:  Normocephalic and atraumatic. Eyes:  Without icterus, sclera clear and conjunctiva pink.  Ears:  Normal auditory acuity. Cardiovascular:  S1, S2 present without murmurs appreciated. Extremities without clubbing or edema. Respiratory:  Clear to auscultation bilaterally. No wheezes, rales, or rhonchi. No distress.  Gastrointestinal:  +BS, obese abdomen but soft, non-tender and non-distended. No HSM noted. No guarding or rebound. No masses appreciated. Noted umbilical hernia is soft, easily reducible, non-tender. Rectal:  Deferred  Skin:  Intact without significant lesions or rashes. Neurologic:  Alert and oriented x4;  grossly normal neurologically. Psych:  Alert and cooperative. Normal mood and affect. Heme/Lymph/Immune: No excessive bruising noted.    04/24/2015 10:00 AM

## 2015-04-24 NOTE — Progress Notes (Signed)
Calculations off of today's labs:  MELD: 7 (1.9% 63-month mortality) Child-Pugh: Class A

## 2015-04-24 NOTE — Assessment & Plan Note (Signed)
Or columns of grade 1 and 2 varices on last endoscopy in 2013 by Dr. Gala Romney. At that time he is recommended to be on 40 mg of Inderal twice a day. Currently he is only on 20 mg 3 times a day and his heart rate is 82, not a target. His blood pressure is 141/71 he can likely tolerated the increased dose. We'll increase his Inderal 40 mg twice a day. He'll likely need a repeat surveillance endoscopy, however we'll wait until we obtain records from Dr. Britta Mccreedy at [facility]. He has been seeing for the past 3 years to determine if it is been done recently. Return for follow-up in 3 months. Labs and imaging as noted below.

## 2015-04-26 LAB — AFP TUMOR MARKER: AFP-Tumor Marker: 1.4 ng/mL (ref <6.1)

## 2015-04-29 NOTE — Progress Notes (Signed)
cc'ed to pcp °

## 2015-05-01 ENCOUNTER — Ambulatory Visit (HOSPITAL_COMMUNITY): Payer: Medicare Other

## 2015-05-02 ENCOUNTER — Ambulatory Visit (HOSPITAL_COMMUNITY)
Admission: RE | Admit: 2015-05-02 | Discharge: 2015-05-02 | Disposition: A | Discharge status: Home / Self Care | Payer: Medicare Other | Source: Ambulatory Visit | Attending: Nurse Practitioner | Admitting: Nurse Practitioner

## 2015-05-02 DIAGNOSIS — E119 Type 2 diabetes mellitus without complications: Secondary | ICD-10-CM | POA: Insufficient documentation

## 2015-05-02 DIAGNOSIS — K7030 Alcoholic cirrhosis of liver without ascites: Secondary | ICD-10-CM | POA: Insufficient documentation

## 2015-05-02 DIAGNOSIS — K8020 Calculus of gallbladder without cholecystitis without obstruction: Secondary | ICD-10-CM | POA: Diagnosis not present

## 2015-05-02 DIAGNOSIS — I8510 Secondary esophageal varices without bleeding: Secondary | ICD-10-CM

## 2015-05-02 DIAGNOSIS — Z87891 Personal history of nicotine dependence: Secondary | ICD-10-CM | POA: Insufficient documentation

## 2015-05-02 DIAGNOSIS — D649 Anemia, unspecified: Secondary | ICD-10-CM | POA: Insufficient documentation

## 2015-05-02 DIAGNOSIS — D500 Iron deficiency anemia secondary to blood loss (chronic): Secondary | ICD-10-CM

## 2015-05-07 ENCOUNTER — Other Ambulatory Visit: Payer: Self-pay | Admitting: Nurse Practitioner

## 2015-06-02 HISTORY — PX: UMBILICAL HERNIA REPAIR: SHX196

## 2015-07-08 ENCOUNTER — Other Ambulatory Visit: Payer: Self-pay | Admitting: Nurse Practitioner

## 2015-07-25 ENCOUNTER — Ambulatory Visit: Payer: Medicare Other | Admitting: Nurse Practitioner

## 2015-10-31 HISTORY — PX: COLONOSCOPY: SHX174

## 2015-11-04 ENCOUNTER — Other Ambulatory Visit: Payer: Self-pay | Admitting: Gastroenterology

## 2016-01-10 ENCOUNTER — Emergency Department (HOSPITAL_COMMUNITY): Payer: Medicare Other

## 2016-01-10 ENCOUNTER — Encounter (HOSPITAL_COMMUNITY): Payer: Self-pay | Admitting: Emergency Medicine

## 2016-01-10 ENCOUNTER — Inpatient Hospital Stay (HOSPITAL_COMMUNITY)
Admission: EM | Admit: 2016-01-10 | Discharge: 2016-01-15 | DRG: 372 | Disposition: A | Discharge status: Home / Self Care | Payer: Medicare Other | Attending: Internal Medicine | Admitting: Internal Medicine

## 2016-01-10 DIAGNOSIS — I10 Essential (primary) hypertension: Secondary | ICD-10-CM | POA: Diagnosis present

## 2016-01-10 DIAGNOSIS — I8510 Secondary esophageal varices without bleeding: Secondary | ICD-10-CM | POA: Diagnosis present

## 2016-01-10 DIAGNOSIS — A030 Shigellosis due to Shigella dysenteriae: Secondary | ICD-10-CM | POA: Diagnosis present

## 2016-01-10 DIAGNOSIS — F419 Anxiety disorder, unspecified: Secondary | ICD-10-CM | POA: Diagnosis present

## 2016-01-10 DIAGNOSIS — K429 Umbilical hernia without obstruction or gangrene: Secondary | ICD-10-CM | POA: Diagnosis present

## 2016-01-10 DIAGNOSIS — Z7984 Long term (current) use of oral hypoglycemic drugs: Secondary | ICD-10-CM

## 2016-01-10 DIAGNOSIS — F329 Major depressive disorder, single episode, unspecified: Secondary | ICD-10-CM | POA: Diagnosis present

## 2016-01-10 DIAGNOSIS — R7881 Bacteremia: Secondary | ICD-10-CM | POA: Diagnosis present

## 2016-01-10 DIAGNOSIS — E11649 Type 2 diabetes mellitus with hypoglycemia without coma: Secondary | ICD-10-CM | POA: Diagnosis not present

## 2016-01-10 DIAGNOSIS — K7031 Alcoholic cirrhosis of liver with ascites: Secondary | ICD-10-CM | POA: Diagnosis present

## 2016-01-10 DIAGNOSIS — Z794 Long term (current) use of insulin: Secondary | ICD-10-CM | POA: Diagnosis not present

## 2016-01-10 DIAGNOSIS — K7030 Alcoholic cirrhosis of liver without ascites: Secondary | ICD-10-CM | POA: Diagnosis not present

## 2016-01-10 DIAGNOSIS — R197 Diarrhea, unspecified: Secondary | ICD-10-CM | POA: Diagnosis present

## 2016-01-10 DIAGNOSIS — B9620 Unspecified Escherichia coli [E. coli] as the cause of diseases classified elsewhere: Secondary | ICD-10-CM | POA: Diagnosis present

## 2016-01-10 DIAGNOSIS — Z87891 Personal history of nicotine dependence: Secondary | ICD-10-CM

## 2016-01-10 DIAGNOSIS — R188 Other ascites: Secondary | ICD-10-CM | POA: Diagnosis not present

## 2016-01-10 DIAGNOSIS — Z79899 Other long term (current) drug therapy: Secondary | ICD-10-CM

## 2016-01-10 DIAGNOSIS — L03116 Cellulitis of left lower limb: Secondary | ICD-10-CM

## 2016-01-10 DIAGNOSIS — K652 Spontaneous bacterial peritonitis: Principal | ICD-10-CM

## 2016-01-10 DIAGNOSIS — A09 Infectious gastroenteritis and colitis, unspecified: Secondary | ICD-10-CM | POA: Diagnosis not present

## 2016-01-10 DIAGNOSIS — E871 Hypo-osmolality and hyponatremia: Secondary | ICD-10-CM

## 2016-01-10 DIAGNOSIS — D61818 Other pancytopenia: Secondary | ICD-10-CM | POA: Diagnosis present

## 2016-01-10 LAB — CBC
HCT: 25.2 % — ABNORMAL LOW (ref 39.0–52.0)
Hemoglobin: 8.4 g/dL — ABNORMAL LOW (ref 13.0–17.0)
MCH: 31.3 pg (ref 26.0–34.0)
MCHC: 33.3 g/dL (ref 30.0–36.0)
MCV: 94 fL (ref 78.0–100.0)
Platelets: 115 10*3/uL — ABNORMAL LOW (ref 150–400)
RBC: 2.68 MIL/uL — ABNORMAL LOW (ref 4.22–5.81)
RDW: 16.9 % — ABNORMAL HIGH (ref 11.5–15.5)
WBC: 2.7 10*3/uL — ABNORMAL LOW (ref 4.0–10.5)

## 2016-01-10 LAB — COMPREHENSIVE METABOLIC PANEL
ALT: 23 U/L (ref 17–63)
AST: 31 U/L (ref 15–41)
Albumin: 3.1 g/dL — ABNORMAL LOW (ref 3.5–5.0)
Alkaline Phosphatase: 77 U/L (ref 38–126)
Anion gap: 10 (ref 5–15)
BUN: 13 mg/dL (ref 6–20)
CO2: 25 mmol/L (ref 22–32)
Calcium: 8.4 mg/dL — ABNORMAL LOW (ref 8.9–10.3)
Chloride: 91 mmol/L — ABNORMAL LOW (ref 101–111)
Creatinine, Ser: 0.84 mg/dL (ref 0.61–1.24)
GFR calc Af Amer: >60 mL/min (ref >60)
GFR calc non Af Amer: >60 mL/min (ref >60)
Glucose, Bld: 108 mg/dL — ABNORMAL HIGH (ref 65–99)
Potassium: 4 mmol/L (ref 3.5–5.1)
Sodium: 126 mmol/L — ABNORMAL LOW (ref 135–145)
Total Bilirubin: 1.3 mg/dL — ABNORMAL HIGH (ref 0.3–1.2)
Total Protein: 6.8 g/dL (ref 6.5–8.1)

## 2016-01-10 LAB — GLUCOSE, CAPILLARY: Glucose-Capillary: 140 mg/dL — ABNORMAL HIGH (ref 65–99)

## 2016-01-10 LAB — C DIFFICILE QUICK SCREEN W PCR REFLEX
C Diff antigen: NEGATIVE
C Diff interpretation: NOT DETECTED
C Diff toxin: NEGATIVE

## 2016-01-10 LAB — URINE MICROSCOPIC-ADD ON
Bacteria, UA: NONE SEEN
RBC / HPF: NONE SEEN RBC/hpf (ref 0–5)
Squamous Epithelial / HPF: NONE SEEN

## 2016-01-10 LAB — LACTATE DEHYDROGENASE, PLEURAL OR PERITONEAL FLUID: LD, Fluid: 54 U/L — ABNORMAL HIGH (ref 3–23)

## 2016-01-10 LAB — GRAM STAIN

## 2016-01-10 LAB — URINALYSIS, ROUTINE W REFLEX MICROSCOPIC
Bilirubin Urine: NEGATIVE
Glucose, UA: NEGATIVE mg/dL
Hgb urine dipstick: NEGATIVE
Ketones, ur: NEGATIVE mg/dL
Leukocytes, UA: NEGATIVE
Nitrite: NEGATIVE
Specific Gravity, Urine: 1.01 (ref 1.005–1.030)
pH: 6 (ref 5.0–8.0)

## 2016-01-10 LAB — BODY FLUID CELL COUNT WITH DIFFERENTIAL
Eos, Fluid: 0 %
Lymphs, Fluid: 38 %
Monocyte-Macrophage-Serous Fluid: 52 % (ref 50–90)
Neutrophil Count, Fluid: 10 % (ref 0–25)
Total Nucleated Cell Count, Fluid: 409 cu mm (ref 0–1000)

## 2016-01-10 LAB — LACTIC ACID, PLASMA
Lactic Acid, Venous: 1.6 mmol/L (ref 0.5–1.9)
Lactic Acid, Venous: 1.6 mmol/L (ref 0.5–1.9)

## 2016-01-10 LAB — TYPE AND SCREEN
ABO/RH(D): O POS
Antibody Screen: NEGATIVE

## 2016-01-10 LAB — GLUCOSE, PERITONEAL FLUID: Glucose, Peritoneal Fluid: 92 mg/dL

## 2016-01-10 LAB — PROTEIN, BODY FLUID: Total protein, fluid: <3 g/dL

## 2016-01-10 LAB — ALBUMIN, FLUID (OTHER): Albumin, Fluid: 0.9 g/dL

## 2016-01-10 LAB — PROTIME-INR
INR: 1.15
Prothrombin Time: 14.7 seconds (ref 11.4–15.2)

## 2016-01-10 LAB — LIPASE, BLOOD: Lipase: 13 U/L (ref 11–51)

## 2016-01-10 LAB — POC OCCULT BLOOD, ED: Fecal Occult Bld: NEGATIVE

## 2016-01-10 LAB — TROPONIN I: Troponin I: <0.03 ng/mL (ref <0.03)

## 2016-01-10 MED ORDER — PANTOPRAZOLE SODIUM 40 MG PO TBEC
40.0000 mg | DELAYED_RELEASE_TABLET | Freq: Every day | ORAL | Status: DC
  Administered 2016-01-10 – 2016-01-15 (×6): 40 mg via ORAL
  Filled 2016-01-10 (×6): qty 1

## 2016-01-10 MED ORDER — INSULIN ASPART 100 UNIT/ML ~~LOC~~ SOLN
0.0000 [IU] | Freq: Three times a day (TID) | SUBCUTANEOUS | Status: DC
  Administered 2016-01-11 (×2): 2 [IU] via SUBCUTANEOUS
  Administered 2016-01-12 (×2): 1 [IU] via SUBCUTANEOUS
  Administered 2016-01-12: 3 [IU] via SUBCUTANEOUS
  Administered 2016-01-13: 5 [IU] via SUBCUTANEOUS
  Administered 2016-01-13: 2 [IU] via SUBCUTANEOUS
  Administered 2016-01-14: 3 [IU] via SUBCUTANEOUS
  Administered 2016-01-14: 2 [IU] via SUBCUTANEOUS
  Administered 2016-01-15: 3 [IU] via SUBCUTANEOUS
  Administered 2016-01-15: 2 [IU] via SUBCUTANEOUS

## 2016-01-10 MED ORDER — FLUOXETINE HCL 20 MG PO CAPS: 20.0000 mg | ORAL_CAPSULE | Freq: Every day | ORAL | Status: DC

## 2016-01-10 MED ORDER — PRAVASTATIN SODIUM 10 MG PO TABS
20.0000 mg | ORAL_TABLET | Freq: Every day | ORAL | Status: DC
  Administered 2016-01-10 – 2016-01-15 (×6): 20 mg via ORAL
  Filled 2016-01-10 (×6): qty 2

## 2016-01-10 MED ORDER — SODIUM CHLORIDE 0.9% FLUSH
3.0000 mL | Freq: Two times a day (BID) | INTRAVENOUS | Status: DC
  Administered 2016-01-11 – 2016-01-15 (×9): 3 mL via INTRAVENOUS

## 2016-01-10 MED ORDER — CYCLOBENZAPRINE HCL 10 MG PO TABS
10.0000 mg | ORAL_TABLET | Freq: Every day | ORAL | Status: DC
  Administered 2016-01-10 – 2016-01-15 (×6): 10 mg via ORAL
  Filled 2016-01-10 (×6): qty 1

## 2016-01-10 MED ORDER — ONDANSETRON HCL 4 MG/2ML IJ SOLN: 4.0000 mg | Freq: Three times a day (TID) | INTRAMUSCULAR | Status: DC | PRN

## 2016-01-10 MED ORDER — SODIUM CHLORIDE 0.9% FLUSH: 3.0000 mL | INTRAVENOUS | Status: DC | PRN

## 2016-01-10 MED ORDER — SODIUM CHLORIDE 0.9 % IV SOLN: 250.0000 mL | INTRAVENOUS | Status: DC | PRN

## 2016-01-10 MED ORDER — INSULIN DETEMIR 100 UNIT/ML ~~LOC~~ SOLN
SUBCUTANEOUS | Status: AC
  Filled 2016-01-10: qty 1

## 2016-01-10 MED ORDER — ADULT MULTIVITAMIN W/MINERALS CH
1.0000 | ORAL_TABLET | Freq: Every day | ORAL | Status: DC
  Administered 2016-01-10 – 2016-01-15 (×6): 1 via ORAL
  Filled 2016-01-10 (×6): qty 1

## 2016-01-10 MED ORDER — IOPAMIDOL (ISOVUE-300) INJECTION 61%
100.0000 mL | Freq: Once | INTRAVENOUS | Status: AC | PRN
  Administered 2016-01-10: 100 mL via INTRAVENOUS

## 2016-01-10 MED ORDER — DEXTROSE 5 % IV SOLN
INTRAVENOUS | Status: AC
  Filled 2016-01-10: qty 2

## 2016-01-10 MED ORDER — GABAPENTIN 100 MG PO CAPS
100.0000 mg | ORAL_CAPSULE | Freq: Three times a day (TID) | ORAL | Status: DC
Start: 2016-01-10 — End: 2016-01-15
  Administered 2016-01-10 – 2016-01-15 (×15): 100 mg via ORAL
  Filled 2016-01-10 (×15): qty 1

## 2016-01-10 MED ORDER — PROPRANOLOL HCL 20 MG PO TABS
40.0000 mg | ORAL_TABLET | Freq: Two times a day (BID) | ORAL | Status: DC
  Administered 2016-01-10 – 2016-01-15 (×10): 40 mg via ORAL
  Filled 2016-01-10 (×10): qty 2

## 2016-01-10 MED ORDER — SODIUM CHLORIDE 0.9 % IV SOLN: INTRAVENOUS | Status: AC

## 2016-01-10 MED ORDER — ONDANSETRON HCL 4 MG/2ML IJ SOLN
4.0000 mg | Freq: Once | INTRAMUSCULAR | Status: AC
  Administered 2016-01-10: 4 mg via INTRAVENOUS
  Filled 2016-01-10: qty 2

## 2016-01-10 MED ORDER — GLIPIZIDE 5 MG PO TABS
10.0000 mg | ORAL_TABLET | Freq: Two times a day (BID) | ORAL | Status: DC
  Administered 2016-01-10 – 2016-01-14 (×6): 10 mg via ORAL
  Filled 2016-01-10 (×6): qty 2

## 2016-01-10 MED ORDER — OXYCODONE HCL 5 MG PO TABS
5.0000 mg | ORAL_TABLET | ORAL | Status: DC | PRN
  Administered 2016-01-12: 5 mg via ORAL
  Filled 2016-01-10: qty 1

## 2016-01-10 MED ORDER — SODIUM CHLORIDE 0.9 % IV SOLN
INTRAVENOUS | Status: DC
  Administered 2016-01-10 (×2): via INTRAVENOUS

## 2016-01-10 MED ORDER — POLYSACCHARIDE IRON COMPLEX 150 MG PO CAPS
150.0000 mg | ORAL_CAPSULE | Freq: Every day | ORAL | Status: DC
  Administered 2016-01-10 – 2016-01-15 (×6): 150 mg via ORAL
  Filled 2016-01-10 (×6): qty 1

## 2016-01-10 MED ORDER — INSULIN DETEMIR 100 UNIT/ML ~~LOC~~ SOLN
80.0000 [IU] | Freq: Every day | SUBCUTANEOUS | Status: DC
Start: 2016-01-10 — End: 2016-01-13
  Administered 2016-01-10 – 2016-01-12 (×2): 80 [IU] via SUBCUTANEOUS
  Filled 2016-01-10 (×4): qty 0.8

## 2016-01-10 MED ORDER — VENLAFAXINE HCL 37.5 MG PO TABS
37.5000 mg | ORAL_TABLET | Freq: Two times a day (BID) | ORAL | Status: DC
Start: 2016-01-10 — End: 2016-01-15
  Administered 2016-01-10 – 2016-01-15 (×10): 37.5 mg via ORAL
  Filled 2016-01-10 (×10): qty 1

## 2016-01-10 MED ORDER — INSULIN ASPART 100 UNIT/ML ~~LOC~~ SOLN
0.0000 [IU] | Freq: Every day | SUBCUTANEOUS | Status: DC
  Administered 2016-01-12 – 2016-01-14 (×2): 2 [IU] via SUBCUTANEOUS

## 2016-01-10 MED ORDER — ENSURE ENLIVE PO LIQD
237.0000 mL | Freq: Two times a day (BID) | ORAL | Status: DC
Start: 2016-01-11 — End: 2016-01-15
  Administered 2016-01-11 – 2016-01-15 (×10): 237 mL via ORAL

## 2016-01-10 MED ORDER — FUROSEMIDE 40 MG PO TABS
40.0000 mg | ORAL_TABLET | Freq: Every day | ORAL | Status: DC
  Administered 2016-01-10 – 2016-01-15 (×6): 40 mg via ORAL
  Filled 2016-01-10 (×6): qty 1

## 2016-01-10 MED ORDER — ONDANSETRON HCL 4 MG/2ML IJ SOLN
4.0000 mg | Freq: Four times a day (QID) | INTRAMUSCULAR | Status: DC | PRN
  Administered 2016-01-10: 4 mg via INTRAVENOUS
  Filled 2016-01-10: qty 2

## 2016-01-10 MED ORDER — ONDANSETRON HCL 4 MG PO TABS: 4.0000 mg | ORAL_TABLET | Freq: Four times a day (QID) | ORAL | Status: DC | PRN

## 2016-01-10 MED ORDER — LISINOPRIL 10 MG PO TABS
10.0000 mg | ORAL_TABLET | Freq: Every day | ORAL | Status: DC
  Administered 2016-01-10 – 2016-01-15 (×6): 10 mg via ORAL
  Filled 2016-01-10 (×6): qty 1

## 2016-01-10 MED ORDER — INSULIN ASPART 100 UNIT/ML ~~LOC~~ SOLN
3.0000 [IU] | Freq: Three times a day (TID) | SUBCUTANEOUS | Status: DC
  Administered 2016-01-12 – 2016-01-15 (×7): 3 [IU] via SUBCUTANEOUS

## 2016-01-10 MED ORDER — TAMSULOSIN HCL 0.4 MG PO CAPS
0.4000 mg | ORAL_CAPSULE | Freq: Every day | ORAL | Status: DC
  Administered 2016-01-10 – 2016-01-15 (×6): 0.4 mg via ORAL
  Filled 2016-01-10 (×6): qty 1

## 2016-01-10 MED ORDER — VITAMIN B-1 100 MG PO TABS
250.0000 mg | ORAL_TABLET | Freq: Every day | ORAL | Status: DC
  Administered 2016-01-10 – 2016-01-15 (×6): 250 mg via ORAL
  Filled 2016-01-10 (×6): qty 3

## 2016-01-10 MED ORDER — SPIRONOLACTONE 25 MG PO TABS
25.0000 mg | ORAL_TABLET | Freq: Every day | ORAL | Status: DC
  Administered 2016-01-10 – 2016-01-15 (×6): 25 mg via ORAL
  Filled 2016-01-10 (×6): qty 1

## 2016-01-10 MED ORDER — DEXTROSE 5 % IV SOLN
2.0000 g | INTRAVENOUS | Status: DC
  Administered 2016-01-10 – 2016-01-14 (×5): 2 g via INTRAVENOUS
  Filled 2016-01-10 (×6): qty 2

## 2016-01-10 NOTE — Procedures (Signed)
Successful diagnostic paracentesis.  JWatts MD

## 2016-01-10 NOTE — ED Notes (Signed)
Returned from CT.

## 2016-01-10 NOTE — ED Notes (Signed)
Pt able to ambulate to and from BR independently. Stable gait. Pt has had 5 loose stools. Stool collected and sent to lab

## 2016-01-10 NOTE — ED Notes (Signed)
Pt complaining of nausea. Zofran 4 mg IV adm per standing order

## 2016-01-10 NOTE — ED Triage Notes (Signed)
Pt reports nausea, diarrhea, headache, fever (99.9 at doctors office today).  PT was sent by gastro doctor in [place].  Pt reports having bleeding polyps in upper intestines.  PT received 2 pints of blood last Friday.  Pt denies bloody stool.

## 2016-01-10 NOTE — ED Provider Notes (Signed)
[facility] DEPT Provider Note   CSN: [id number] Arrival date & time: 01/10/16  1015  First Provider Contact:  None       History   Chief Complaint Chief Complaint  Patient presents with  . Diarrhea    HPI Jeffrey [name] is a 70 y.o. male.  HPI  Pt was seen at 1130. Per pt, c/o gradual onset and persistence of multiple intermittent episodes of diarrhea for the past 2 days. Describes the stools as "water" but "now turning darker." Has been associated with "aching" abd pain, nausea and home temps to "99.9." Pt states he was evaluated at his PMD's office today and was told to come to the ED for admission "because I have a history of bleeding from my stomach and intestines." Pt he "just got a blood transfusion" 1 week ago. Denies vomiting, no frank blood in stools, no back pain, no CP/SOB, no cough.     Past Medical History:  Diagnosis Date  . Alcoholic cirrhosis ([place])    patient reports completing hep a and b vaccines in 2006  . Anemia    has had 3 units of prbcs 2013  . Anxiety   . Asymptomatic gallstones    Ultrasound in 2006  . Depression   . Diabetes ([place])   . Duodenal ulcer with hemorrhage    per patient in 2006 or 2007, records have been requested  . 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 History of alcohol abuse    quit in 2006  . Hypertension   . Splenomegaly    Ultrasound in 2006  . Tubular adenoma of colon     Patient Active Problem List   Diagnosis Date Noted  . Cirrhosis ([place]) 09/05/2012  . Cirrhosis, alcoholic ([place]) [id number]  . Heme positive stool 03/09/2012  . Anemia due to chronic blood loss 03/09/2012  . Pancytopenia ([place]) 03/09/2012  . FH: colon cancer 03/09/2012  . 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 APPENDECTOMY    . COLONOSCOPY  03/10/2005   Rectal polyp as described above, removed with snare. Left sided  diverticula. The remainder of the colonic mucosa  appeared normal. Inflammed polyp on path.  . COLONOSCOPY  04/1999   Dr. Thea Silversmith polyps removed,  Path showed hyperplastic  . COLONOSCOPY WITH ESOPHAGOGASTRODUODENOSCOPY (EGD)  03/31/2012   [id number] AVMs. Colonic diverticulosis. tubular adenoma colon  . ESOPHAGOGASTRODUODENOSCOPY  01/15/2005   Three columns grade 1 to 2 esophageal varices, otherwise normal esophageal mucosa.  Esophagus was not manipulated otherwise./Nodularity of the antrum with overlying erosions, nonspecific finding. Path showed rare H.pylori  . ESOPHAGOGASTRODUODENOSCOPY  09/2008   Dr. Gaylene Brooks columns of grade 2-3 esoph varices, only one column was prominent. Portal gastropathy, multiple gastrc polyps at antrum, two were 2cm with black eschar, bulbar polyps, bulbar erosions  . ESOPHAGOGASTRODUODENOSCOPY  11/2004   Dr. Brantley Stage 3 esoph varices  . ESOPHAGOGASTRODUODENOSCOPY  03/31/2012   RMR: 4 columns(3-GR 2, 1-GR1) non-bleeding esophageal varices, portal gastropathy, small HH, early GAVE, multiple gastric polyps   . GASTRIC VARICES BANDING  03/31/2012   Procedure: GASTRIC VARICES BANDING;  Surgeon: Daneil Dolin, MD;  Location: [facility];  Service: Endoscopy;  Laterality: N/A;       Home Medications    Prior to Admission medications   Medication Sig Start Date End Date Taking? Authorizing Provider  ALPHA LIPOIC ACID PO Take 1 tablet by mouth daily.  Yes Historical Provider, MD  Ascorbic Acid (VITAMIN C) 1000 MG tablet Take 1,000 mg by mouth daily.   Yes Historical Provider, MD  Cholecalciferol (VITAMIN D-3 PO) Take 1 tablet by mouth daily.    Yes Historical Provider, MD  Chromium Picolinate (CHROMIUM PICOLATE) 1000 MCG TABS Take 1 tablet by mouth daily.    Yes Historical Provider, MD  Cinnamon 500 MG capsule Take 500 mg by mouth daily.   Yes Historical Provider, MD  Cyanocobalamin (VITAMIN B 12 PO) Take 1 tablet by mouth daily.    Yes Historical Provider, MD  cyclobenzaprine (FLEXERIL) 10  MG tablet Take 10 mg by mouth daily.  02/03/12  Yes Historical Provider, MD  Flaxseed, Linseed, (FLAX SEED OIL) 1300 MG CAPS Take 1 capsule by mouth daily.    Yes Historical Provider, MD  FLUoxetine (PROZAC) 20 MG capsule Take 20 mg by mouth daily.  02/23/12  Yes Historical Provider, MD  furosemide (LASIX) 40 MG tablet Take 40 mg by mouth daily.    Yes Historical Provider, MD  gabapentin (NEURONTIN) 100 MG capsule Take 100 mg by mouth 3 (three) times daily.   Yes Historical Provider, MD  Garlic [id number] MG CAPS Take 1 capsule by mouth daily.    Yes Historical Provider, MD  glipiZIDE (GLUCOTROL) 10 MG tablet Take 10 mg by mouth 2 (two) times daily before a meal.  02/16/12  Yes Historical Provider, MD  HYDROcodone-acetaminophen (NORCO) 10-325 MG tablet Take 1 tablet by mouth every 6 (six) hours as needed.   Yes Historical Provider, MD  iron polysaccharides (NIFEREX) 150 MG capsule Take 150 mg by mouth daily.   Yes Historical Provider, MD  JANUVIA 100 MG tablet Take 100 mg by mouth daily.  08/28/12  Yes Historical Provider, MD  LEVEMIR FLEXPEN 100 UNIT/ML injection Inject 80 Units into the skin at bedtime.  03/04/12  Yes Historical Provider, MD  lisinopril (PRINIVIL,ZESTRIL) 10 MG tablet Take 10 mg by mouth daily.   Yes Historical Provider, MD  Lutein 10 MG TABS Take 1 tablet by mouth daily.    Yes Historical Provider, MD  metFORMIN (GLUCOPHAGE) 1000 MG tablet Take 1,000 mg by mouth 2 (two) times daily with a meal.  02/23/12  Yes Historical Provider, MD  milk thistle 175 MG tablet Take 175 mg by mouth daily.   Yes Historical Provider, MD  Multiple Vitamin (MULTIVITAMIN) capsule Take 1 capsule by mouth daily.   Yes Historical Provider, MD  NOVOLOG FLEXPEN 100 UNIT/ML injection Inject 30 Units into the skin every evening.  02/05/12  Yes Historical Provider, MD  omeprazole (PRILOSEC) 20 MG capsule TAKE 1 CAPSULE BY MOUTH EVERY DAY Patient taking differently: TAKE 1 CAPSULE BY MOUTH EVERY DAY 40 mg 05/16/14  Yes Carlis Stable, NP  pravastatin (PRAVACHOL) 20 MG tablet Take 20 mg by mouth daily.   Yes Historical Provider, MD  propranolol (INDERAL) 40 MG tablet TAKE 1 TABLET BY MOUTH TWICE DAILY 11/05/15  Yes Carlis Stable, NP  ranitidine (ZANTAC) 150 MG tablet Take 150 mg by mouth 2 (two) times daily.  03/03/12  Yes Historical Provider, MD  spironolactone (ALDACTONE) 25 MG tablet Take 25 mg by mouth daily.   Yes Historical Provider, MD  tamsulosin (FLOMAX) 0.4 MG CAPS capsule Take 0.4 mg by mouth.   Yes Historical Provider, MD  Thiamine HCl (VITAMIN B-1) 250 MG tablet Take 250 mg by mouth daily.   Yes Historical Provider, MD  triamcinolone cream (KENALOG) 0.1 % Apply 1 application topically  2 (two) times daily.   Yes Historical Provider, MD  venlafaxine (EFFEXOR) 37.5 MG tablet Take 37.5 mg by mouth 2 (two) times daily.   Yes Historical Provider, MD  vitamin E 400 UNIT capsule Take 400 Units by mouth daily.   Yes Historical Provider, MD    Family History Family History  Problem Relation Age of Onset  . Colon cancer Father 20    still living, age 19  . Breast cancer Sister     deceased  . Breast cancer Sister   . Lung cancer Neg Hx   . Ovarian cancer Neg Hx     Social History Social History  Substance Use Topics  . Smoking status: Former Smoker    Packs/day: 0.50    Types: Cigarettes  . Smokeless tobacco: Not on file     Comment: qiut about 25+ years  . Alcohol use No     Comment: quit in 2006     Allergies   Review of patient's allergies indicates no known allergies.   Review of Systems Review of Systems ROS: Statement: All systems negative except as marked or noted in the HPI; Constitutional: +chills. ; ; Eyes: Negative for eye pain, redness and discharge. ; ; ENMT: Negative for ear pain, hoarseness, nasal congestion, sinus pressure and sore throat. ; ; Cardiovascular: Negative for chest pain, palpitations, diaphoresis, dyspnea and peripheral edema. ; ; Respiratory: Negative for cough, wheezing  and stridor. ; ; Gastrointestinal: +nausea, diarrhea, "dark stools," abd pain.  Negative for vomiting, blood in stool, hematemesis, jaundice and rectal bleeding. . ; ; Genitourinary: Negative for dysuria, flank pain and hematuria. ; ; Musculoskeletal: Negative for back pain and neck pain. Negative for swelling and trauma.; ; Skin: Negative for pruritus, rash, abrasions, blisters, bruising and skin lesion.; ; Neuro: Negative for headache, lightheadedness and neck stiffness. Negative for weakness, altered level of consciousness, altered mental status, extremity weakness, paresthesias, involuntary movement, seizure and syncope.      Physical Exam Updated Vital Signs BP 125/59   Pulse 93   Temp 99.7 F (37.6 C) (Oral)   Resp 18   Ht 5\' 7"  (1.702 m)   Wt 224 lb (101.6 kg)   SpO2 96%   BMI 35.08 kg/m    Patient Vitals for the past 24 hrs:  BP Temp Temp src Pulse Resp SpO2 Height Weight  01/10/16 1323 - 99.7 F (37.6 C) Oral - - - - -  01/10/16 1200 125/59 - - 93 18 96 % - -  01/10/16 1130 127/64 - - 92 21 97 % - -  01/10/16 1115 140/68 - - - (!) 36 - - -  01/10/16 1100 138/61 - - 92 20 96 % - -  01/10/16 1039 - - - - - - 5\' 7"  (1.702 m) 224 lb (101.6 kg)  01/10/16 1038 147/56 100.4 F (38 C) Oral 90 18 98 % - -     Physical Exam 1135: Physical examination:  Nursing notes reviewed; Vital signs and O2 SAT reviewed;  Constitutional: Well developed, Well nourished, In no acute distress; Head:  Normocephalic, atraumatic; Eyes: EOMI, PERRL, No scleral icterus; ENMT: Mouth and pharynx normal, Mucous membranes dry; Neck: Supple, Full range of motion, No lymphadenopathy; Cardiovascular: Regular rate and rhythm, No gallop; Respiratory: Breath sounds clear & equal bilaterally, No wheezes.  Speaking full sentences with ease, Normal respiratory effort/excursion; Chest: Nontender, Movement normal; Abdomen: Nontender, Protuberant, softly distended, +NT, easily reducible ventral hernia. Normal bowel  sounds. Rectal exam performed w/permission  of pt and ED RN chaperone present.  Anal tone normal.  Non-tender. Minimal mucus (no stool) in rectal vault, heme neg.  No fissures.;; Genitourinary: No CVA tenderness; Extremities: Pulses normal, No tenderness, +2 pedal edema bilat, No calf asymmetry. +erythema and warmth to left anterior-medial lower leg. No open wounds.; Neuro: AA&Ox3, Major CN grossly intact.  Speech clear. No gross focal motor deficits in extremities. Climbs on and off stretcher easily by himself. Gait steady.; Skin: Color normal, Warm, Dry. +scattered petechiae.   ED Treatments / Results  Labs (all labs ordered are listed, but only abnormal results are displayed)   EKG  EKG Interpretation  Date/Time:  Friday January 10 2016 13:20:47 EDT Ventricular Rate:  93 PR Interval:    QRS Duration: 90 QT Interval:  353 QTC Calculation: 439 R Axis:   20 Text Interpretation:  Sinus rhythm Abnormal R-wave progression, early transition No old tracing to compare Confirmed by [facility]  MD, Nunzio Cory [phone]) on 01/10/2016 1:58:32 PM       Radiology   Procedures Procedures (including critical care time)  Medications Ordered in ED Medications  0.9 %  sodium chloride infusion ( Intravenous New Bag/Given 01/10/16 1215)  ondansetron (ZOFRAN) injection 4 mg (4 mg Intravenous Given 01/10/16 1128)  iopamidol (ISOVUE-300) 61 % injection 100 mL (100 mLs Intravenous Contrast Given 01/10/16 1303)     Initial Impression / Assessment and Plan / ED Course  I have reviewed the triage vital signs and the nursing notes.  Pertinent labs & imaging results that were available during my care of the patient were reviewed by me and considered in my medical decision making (see chart for details).  MDM Reviewed: previous chart, nursing note and vitals Reviewed previous: labs and ECG Interpretation: labs, x-ray, ECG and CT scan   Results for orders placed or performed during the hospital encounter of  01/10/16  Lipase, blood  Result Value Ref Range   Lipase 13 11 - 51 U/L  Comprehensive metabolic panel  Result Value Ref Range   Sodium 126 (L) 135 - 145 mmol/L   Potassium 4.0 3.5 - 5.1 mmol/L   Chloride 91 (L) 101 - 111 mmol/L   CO2 25 22 - 32 mmol/L   Glucose, Bld 108 (H) 65 - 99 mg/dL   BUN 13 6 - 20 mg/dL   Creatinine, Ser 0.84 0.61 - 1.24 mg/dL   Calcium 8.4 (L) 8.9 - 10.3 mg/dL   Total Protein 6.8 6.5 - 8.1 g/dL   Albumin 3.1 (L) 3.5 - 5.0 g/dL   AST 31 15 - 41 U/L   ALT 23 17 - 63 U/L   Alkaline Phosphatase 77 38 - 126 U/L   Total Bilirubin 1.3 (H) 0.3 - 1.2 mg/dL   GFR calc non Af Amer >60 >60 mL/min   GFR calc Af Amer >60 >60 mL/min   Anion gap 10 5 - 15  CBC  Result Value Ref Range   WBC 2.7 (L) 4.0 - 10.5 K/uL   RBC 2.68 (L) 4.22 - 5.81 MIL/uL   Hemoglobin 8.4 (L) 13.0 - 17.0 g/dL   HCT 25.2 (L) 39.0 - 52.0 %   MCV 94.0 78.0 - 100.0 fL   MCH 31.3 26.0 - 34.0 pg   MCHC 33.3 30.0 - 36.0 g/dL   RDW 16.9 (H) 11.5 - 15.5 %   Platelets 115 (L) 150 - 400 K/uL  Urinalysis, Routine w reflex microscopic  Result Value Ref Range   Color, Urine YELLOW YELLOW  APPearance CLEAR CLEAR   Specific Gravity, Urine 1.010 1.005 - 1.030   pH 6.0 5.0 - 8.0   Glucose, UA NEGATIVE NEGATIVE mg/dL   Hgb urine dipstick NEGATIVE NEGATIVE   Bilirubin Urine NEGATIVE NEGATIVE   Ketones, ur NEGATIVE NEGATIVE mg/dL   Protein, ur TRACE (A) NEGATIVE mg/dL   Nitrite NEGATIVE NEGATIVE   Leukocytes, UA NEGATIVE NEGATIVE  Lactic acid, plasma  Result Value Ref Range   Lactic Acid, Venous 1.6 0.5 - 1.9 mmol/L  Troponin I  Result Value Ref Range   Troponin I <0.03 <0.03 ng/mL  Urine microscopic-add on  Result Value Ref Range   Squamous Epithelial / LPF NONE SEEN NONE SEEN   WBC, UA 0-5 0 - 5 WBC/hpf   RBC / HPF NONE SEEN 0 - 5 RBC/hpf   Bacteria, UA NONE SEEN NONE SEEN   Dg Chest 2 View Result Date: 01/10/2016 CLINICAL DATA:  Nausea, vomiting, diarrhea, and fever for 3 days. Received  blood last Friday because of bleeding polyps. 10/02/2013 EXAM: CHEST  2 VIEW COMPARISON:  10/02/2013 FINDINGS: Shallow lung inflation. Heart size is enlarged. There are no focal consolidations or pleural effusions. Moderate mid thoracic spondylosis noted. No free air beneath the diaphragm. IMPRESSION: No evidence for acute cardiopulmonary abnormality. Electronically Signed   By: Nolon Nations M.D.   On: 01/10/2016 12:55   Ct Abdomen Pelvis W Contrast Result Date: 01/10/2016 CLINICAL DATA:  Nausea and diarrhea EXAM: CT ABDOMEN AND PELVIS WITH CONTRAST TECHNIQUE: Multidetector CT imaging of the abdomen and pelvis was performed using the standard protocol following bolus administration of intravenous contrast. CONTRAST:  173mL ISOVUE-300 IOPAMIDOL (ISOVUE-300) INJECTION 61% COMPARISON:  09/02/2015 FINDINGS: Lower chest:  No acute findings. Hepatobiliary: Changes of underlying cirrhosis are noted. Differential enhancement is noted related to the underlying cirrhosis. No definitive mass lesion is seen. Gallbladder is well distended with multiple gallstones. Pancreas: No mass, inflammatory changes, or other significant abnormality. Spleen: Spleen is enlarged consistent with underlying portal hypertension related to the ascites. A spontaneous splenorenal shunt is noted. Perisplenic varices are noted. No definitive esophageal or perigastric varices are seen. Adrenals/Urinary Tract: The adrenal glands are within normal limits. Kidney show no renal calculi or obstructive changes. Delayed images demonstrate normal excretion of contrast. Bladder is within normal limits. Stomach/Bowel: No evidence of obstruction, inflammatory process, or abnormal fluid collections. The appendix is not visualize consistent with a prior surgical history. Vascular/Lymphatic: Aortic calcifications are noted without aneurysmal dilatation. Considerable increase perirectal venous engorgement is noted consistent with the portal hypertension. No  significant lymphadenopathy is identified. Reproductive: No mass or other significant abnormality. Other: An abdominal wall hernia is noted in the periumbilical region containing a small amount of omental fat and ascites. Mild moderate ascites is noted. Musculoskeletal:  No suspicious bone lesions identified. IMPRESSION: Changes consistent with cirrhosis of the liver and portal hypertension with ascites and significant increased perirectal varices. Cholelithiasis without complicating factors. Anterior abdominal wall hernia Electronically Signed   By: Inez Catalina M.D.   On: 01/10/2016 13:46    Results for KIMBAL, BADDER (MRN [id number]) as of 01/10/2016 14:15  Ref. Range 04/24/2015 11:13 01/10/2016 10:45  Hemoglobin Latest Ref Range: 13.0 - 17.0 g/dL 9.4 (L) 8.4 (L)  HCT Latest Ref Range: 39.0 - 52.0 % 29.3 (L) 25.2 (L)  Platelets Latest Ref Range: 150 - 400 K/uL 179 115 (L)   Results for SHAKIEL, COZINE (MRN [id number]) as of 01/10/2016 14:15  Ref. Range 04/24/2015 11:13 01/10/2016 10:45  Sodium  Latest Ref Range: 135 - 145 mmol/L 132 (L) 126 (L)     1415:  Pt has stooled multiple times while in the ED; reminded to tell staff so sample can be collected. Temp spontaneously decreased. UC and BC obtained and are pending. Left lower leg appears cellulitic. Abd remains benign; though given hx ascites with recent temps to 100.4, will obtain US paracentesis with fluid analysis. Dx and testing d/w pt and family.  Questions answered.  Verb understanding, agreeable to admit. T/C to [facility] Dr. Jerilee Hoh, case discussed, including:  HPI, pertinent PM/SHx, VS/PE, dx testing, ED course and treatment:  Agreeable to admit, agrees with obtaining US paracentesis with fluid analysis before starting abx for cellulitis, requests to write temporary orders, obtain medical bed to team [facility].       Final Clinical Impressions(s) / ED Diagnoses   Final diagnoses:  None    New Prescriptions New Prescriptions   No  medications on file     Francine Graven, DO 01/11/16 [id number]

## 2016-01-10 NOTE — ED Notes (Signed)
Patient transported to CT 

## 2016-01-10 NOTE — H&P (Signed)
History and Physical    Jeffrey [name] [id number] DOB: 1946/05/09 DOA: 01/10/2016  Referring MD/NP/PA: Francine Graven, EDP PCP: Monico Blitz, MD  Patient coming from: Home  Chief Complaint: Diarrhea, abdominal pain  HPI: Jeffrey [name] is a 70 y.o. male with history of alcoholic cirrhosis, diabetes, esophageal varices, splenomegaly and hypertension who presents to the hospital today for 2 days worth of diarrhea. diarrhea is watery in consistency, has been having anywhere from 5-7 episodes a day. Of note there is a homeless man who has been living with him who has been diagnosed with C. difficile. He also has abdominal pain not related to food intake. On arrival was noted to have a temperature of 100.4. Because of consideration for SBP a paracentesis was obtained that shows 409 WBCs. Admission has been requested for further evaluation and management.  Past Medical/Surgical History: Past Medical History:  Diagnosis Date  . Alcoholic cirrhosis ([place])    patient reports completing hep a and b vaccines in 2006  . Anemia    has had 3 units of prbcs 2013  . Anxiety   . Asymptomatic gallstones    Ultrasound in 2006  . Depression   . Diabetes (Jeffrey [name])   . Duodenal ulcer with hemorrhage    per patient in 2006 or 2007, records have been requested  . 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 History of alcohol abuse    quit in 2006  . Hypertension   . Splenomegaly    Ultrasound in 2006  . Tubular adenoma of colon     Past Surgical History:  Procedure Laterality Date  . APPENDECTOMY    . COLONOSCOPY  03/10/2005   Rectal polyp as described above, removed with snare. Left sided  diverticula. The remainder of the colonic mucosa appeared normal. Inflammed polyp on path.  . COLONOSCOPY  04/1999   Dr. Thea Silversmith polyps removed,  Path showed hyperplastic  . COLONOSCOPY WITH ESOPHAGOGASTRODUODENOSCOPY (EGD)  03/31/2012   [id number] AVMs. Colonic diverticulosis. tubular adenoma colon  .  ESOPHAGOGASTRODUODENOSCOPY  01/15/2005   Three columns grade 1 to 2 esophageal varices, otherwise normal esophageal mucosa.  Esophagus was not manipulated otherwise./Nodularity of the antrum with overlying erosions, nonspecific finding. Path showed rare H.pylori  . ESOPHAGOGASTRODUODENOSCOPY  09/2008   Dr. Gaylene [name] columns of grade 2-3 esoph varices, only one column was prominent. Portal gastropathy, multiple gastrc polyps at antrum, two were 2cm with black eschar, bulbar polyps, bulbar erosions  . ESOPHAGOGASTRODUODENOSCOPY  11/2004   Dr. Brantley Stage 3 esoph varices  . ESOPHAGOGASTRODUODENOSCOPY  03/31/2012   RMR: 4 columns(3-GR 2, 1-GR1) non-bleeding esophageal varices, portal gastropathy, small HH, early GAVE, multiple gastric polyps   . GASTRIC VARICES BANDING  03/31/2012   Procedure: GASTRIC VARICES BANDING;  Surgeon: Jeffrey Dolin, MD;  Location: [facility];  Service: Endoscopy;  Laterality: N/A;    Social History:  reports that he has quit smoking. His smoking use included Cigarettes. He smoked 0.50 packs per day. He does not have any smokeless tobacco history on file. He reports that he does not drink alcohol or use drugs.  Allergies: No Known Allergies  Family History:  Family History  Problem Relation Age of Onset  . Colon cancer Father 70    still living, age 10  . Breast cancer Sister     deceased  . Breast cancer Sister   . Lung cancer Neg Hx   . Ovarian cancer Neg Hx     Prior to Admission medications  Medication Sig Start Date End Date Taking? Authorizing Provider  ALPHA LIPOIC ACID PO Take 1 tablet by mouth daily.    Yes Historical Provider, MD  Ascorbic Acid (VITAMIN C) 1000 MG tablet Take 1,000 mg by mouth daily.   Yes Historical Provider, MD  Cholecalciferol (VITAMIN D-3 PO) Take 1 tablet by mouth daily.    Yes Historical Provider, MD  Chromium Picolinate (CHROMIUM PICOLATE) 1000 MCG TABS Take 1 tablet by mouth daily.    Yes Historical Provider, MD    Cinnamon 500 MG capsule Take 500 mg by mouth daily.   Yes Historical Provider, MD  Cyanocobalamin (VITAMIN B 12 PO) Take 1 tablet by mouth daily.    Yes Historical Provider, MD  cyclobenzaprine (FLEXERIL) 10 MG tablet Take 10 mg by mouth daily.  02/03/12  Yes Historical Provider, MD  Flaxseed, Linseed, (FLAX SEED OIL) 1300 MG CAPS Take 1 capsule by mouth daily.    Yes Historical Provider, MD  FLUoxetine (PROZAC) 20 MG capsule Take 20 mg by mouth daily.  02/23/12  Yes Historical Provider, MD  furosemide (LASIX) 40 MG tablet Take 40 mg by mouth daily.    Yes Historical Provider, MD  gabapentin (NEURONTIN) 100 MG capsule Take 100 mg by mouth 3 (three) times daily.   Yes Historical Provider, MD  Garlic [id number] MG CAPS Take 1 capsule by mouth daily.    Yes Historical Provider, MD  glipiZIDE (GLUCOTROL) 10 MG tablet Take 10 mg by mouth 2 (two) times daily before a meal.  02/16/12  Yes Historical Provider, MD  HYDROcodone-acetaminophen (NORCO) 10-325 MG tablet Take 1 tablet by mouth every 6 (six) hours as needed.   Yes Historical Provider, MD  iron polysaccharides (NIFEREX) 150 MG capsule Take 150 mg by mouth daily.   Yes Historical Provider, MD  JANUVIA 100 MG tablet Take 100 mg by mouth daily.  08/28/12  Yes Historical Provider, MD  LEVEMIR FLEXPEN 100 UNIT/ML injection Inject 80 Units into the skin at bedtime.  03/04/12  Yes Historical Provider, MD  lisinopril (PRINIVIL,ZESTRIL) 10 MG tablet Take 10 mg by mouth daily.   Yes Historical Provider, MD  Lutein 10 MG TABS Take 1 tablet by mouth daily.    Yes Historical Provider, MD  metFORMIN (GLUCOPHAGE) 1000 MG tablet Take 1,000 mg by mouth 2 (two) times daily with a meal.  02/23/12  Yes Historical Provider, MD  milk thistle 175 MG tablet Take 175 mg by mouth daily.   Yes Historical Provider, MD  Multiple Vitamin (MULTIVITAMIN) capsule Take 1 capsule by mouth daily.   Yes Historical Provider, MD  NOVOLOG FLEXPEN 100 UNIT/ML injection Inject 30 Units into the skin  every evening.  02/05/12  Yes Historical Provider, MD  omeprazole (PRILOSEC) 20 MG capsule TAKE 1 CAPSULE BY MOUTH EVERY DAY Patient taking differently: TAKE 1 CAPSULE BY MOUTH EVERY DAY 40 mg 05/16/14  Yes Jeffrey Stable, NP  pravastatin (PRAVACHOL) 20 MG tablet Take 20 mg by mouth daily.   Yes Historical Provider, MD  propranolol (INDERAL) 40 MG tablet TAKE 1 TABLET BY MOUTH TWICE DAILY 11/05/15  Yes Jeffrey Stable, NP  ranitidine (ZANTAC) 150 MG tablet Take 150 mg by mouth 2 (two) times daily.  03/03/12  Yes Historical Provider, MD  spironolactone (ALDACTONE) 25 MG tablet Take 25 mg by mouth daily.   Yes Historical Provider, MD  tamsulosin (FLOMAX) 0.4 MG CAPS capsule Take 0.4 mg by mouth.   Yes Historical Provider, MD  Thiamine HCl (VITAMIN B-1) 250  MG tablet Take 250 mg by mouth daily.   Yes Historical Provider, MD  triamcinolone cream (KENALOG) 0.1 % Apply 1 application topically 2 (two) times daily.   Yes Historical Provider, MD  venlafaxine (EFFEXOR) 37.5 MG tablet Take 37.5 mg by mouth 2 (two) times daily.   Yes Historical Provider, MD  vitamin E 400 UNIT capsule Take 400 Units by mouth daily.   Yes Historical Provider, MD    Review of Systems:  Constitutional: Positive for fever, chills, appetite change and fatigue.  HEENT: Denies photophobia, eye pain, redness, hearing loss, ear pain, congestion, sore throat, rhinorrhea, sneezing, mouth sores, trouble swallowing, neck pain, neck stiffness and tinnitus.   Respiratory: Denies SOB, DOE, cough, chest tightness,  and wheezing.   Cardiovascular: Denies chest pain, palpitations and leg swelling.  Gastrointestinal: Denies nausea, vomiting,  constipation, blood in stool and abdominal distention.  Genitourinary: Denies dysuria, urgency, frequency, hematuria, flank pain and difficulty urinating.  Endocrine: Denies: hot or cold intolerance, sweats, changes in hair or nails, polyuria, polydipsia. Musculoskeletal: Denies myalgias, back pain, joint swelling,  arthralgias and gait problem.  Skin: Denies pallor, rash and wound.  Neurological: Denies dizziness, seizures, syncope, weakness, light-headedness, numbness and headaches.  Hematological: Denies adenopathy. Easy bruising, personal or family bleeding history  Psychiatric/Behavioral: Denies suicidal ideation, mood changes, confusion, nervousness, sleep disturbance and agitation    Physical Exam: Vitals:   01/10/16 1200 01/10/16 1323 01/10/16 1729 01/10/16 1808  BP: 125/59  118/65 (!) 141/69  Pulse: 93  104 (!) 105  Resp: 18  18 20   Temp:  99.7 F (37.6 C) 100.2 F (37.9 C) 98.8 F (37.1 C)  TempSrc:  Oral Oral Oral  SpO2: 96%  98% 97%  Weight:    101.2 kg (223 lb 3.2 oz)  Height:    5\' 5"  (1.651 m)     Constitutional: NAD, calm, comfortable Eyes: PERRL, lids and conjunctivae normal ENMT: Mucous membranes are Dry.  Neck: normal, supple, no masses, no thyromegaly Respiratory: clear to auscultation bilaterally, no wheezing, no crackles. Normal respiratory effort. No accessory muscle use.  Cardiovascular: Regular rate and rhythm, no murmurs / rubs / gallops. 2+ lower extremity edema. 2+ pedal pulses. No carotid bruits.  Abdomen: no tenderness, no masses palpated. No hepatosplenomegaly. Bowel sounds positive.  Musculoskeletal: no clubbing / cyanosis. No joint deformity upper and lower extremities. Good ROM, no contractures. Normal muscle tone.  Skin: no rashes, lesions, ulcers. No induration Neurologic: CN 2-12 grossly intact. Sensation intact, DTR normal. Strength 5/5 in all 4.  Psychiatric: Normal judgment and insight. Alert and oriented x 3. Normal mood.    Labs on Admission: I have personally reviewed the following labs and imaging studies  CBC:  Recent Labs Lab 01/10/16 1045  WBC 2.7*  HGB 8.4*  HCT 25.2*  MCV 94.0  PLT [id number]   Basic Metabolic Panel:  Recent Labs Lab 01/10/16 1045  NA 126*  K 4.0  CL 91*  CO2 25  GLUCOSE 108*  BUN 13  CREATININE 0.84    CALCIUM 8.4*   GFR: Estimated Creatinine Clearance: 92.1 mL/min (by C-G formula based on SCr of 0.84 mg/dL). Liver Function Tests:  Recent Labs Lab 01/10/16 1045  AST 31  ALT 23  ALKPHOS 77  BILITOT 1.3*  PROT 6.8  ALBUMIN 3.1*    Recent Labs Lab 01/10/16 1045  LIPASE 13   No results for input(s): AMMONIA in the last 168 hours. Coagulation Profile:  Recent Labs Lab 01/10/16 1421  INR 1.15  Cardiac Enzymes:  Recent Labs Lab 01/10/16 1225  TROPONINI <0.03   BNP (last 3 results) No results for input(s): PROBNP in the last 8760 hours. HbA1C: No results for input(s): HGBA1C in the last 72 hours. CBG: No results for input(s): GLUCAP in the last 168 hours. Lipid Profile: No results for input(s): CHOL, HDL, LDLCALC, TRIG, CHOLHDL, LDLDIRECT in the last 72 hours. Thyroid Function Tests: No results for input(s): TSH, T4TOTAL, FREET4, T3FREE, THYROIDAB in the last 72 hours. Anemia Panel: No results for input(s): VITAMINB12, FOLATE, FERRITIN, TIBC, IRON, RETICCTPCT in the last 72 hours. Urine analysis:    Component Value Date/Time   COLORURINE YELLOW 01/10/2016 1110   APPEARANCEUR CLEAR 01/10/2016 1110   LABSPEC 1.010 01/10/2016 1110   PHURINE 6.0 01/10/2016 1110   GLUCOSEU NEGATIVE 01/10/2016 1110   HGBUR NEGATIVE 01/10/2016 1110   BILIRUBINUR NEGATIVE 01/10/2016 1110   KETONESUR NEGATIVE 01/10/2016 1110   PROTEINUR TRACE (A) 01/10/2016 1110   NITRITE NEGATIVE 01/10/2016 1110   LEUKOCYTESUR NEGATIVE 01/10/2016 1110   Sepsis Labs: @LABRCNTIP (procalcitonin:4,lacticidven:4) ) Recent Results (from the past 240 hour(s))  Gram stain     Status: None   Collection Time: 01/10/16  3:10 PM  Result Value Ref Range Status   Specimen Description PERITONEAL  Final   Special Requests NONE  Final   Gram Stain   Final    CYTOSPIN SMEAR NO ORGANISMS SEEN WBC PRESENT,BOTH PMN AND MONONUCLEAR   Report Status 01/10/2016 FINAL  Final     Radiological Exams on  Admission: Dg Chest 2 View  Result Date: 01/10/2016 CLINICAL DATA:  Nausea, vomiting, diarrhea, and fever for 3 days. Received blood last Friday because of bleeding polyps. 10/02/2013 EXAM: CHEST  2 VIEW COMPARISON:  10/02/2013 FINDINGS: Shallow lung inflation. Heart size is enlarged. There are no focal consolidations or pleural effusions. Moderate mid thoracic spondylosis noted. No free air beneath the diaphragm. IMPRESSION: No evidence for acute cardiopulmonary abnormality. Electronically Signed   By: Nolon Nations M.D.   On: 01/10/2016 12:55   Ct Abdomen Pelvis W Contrast  Result Date: 01/10/2016 CLINICAL DATA:  Nausea and diarrhea EXAM: CT ABDOMEN AND PELVIS WITH CONTRAST TECHNIQUE: Multidetector CT imaging of the abdomen and pelvis was performed using the standard protocol following bolus administration of intravenous contrast. CONTRAST:  168mL ISOVUE-300 IOPAMIDOL (ISOVUE-300) INJECTION 61% COMPARISON:  09/02/2015 FINDINGS: Lower chest:  No acute findings. Hepatobiliary: Changes of underlying cirrhosis are noted. Differential enhancement is noted related to the underlying cirrhosis. No definitive mass lesion is seen. Gallbladder is well distended with multiple gallstones. Pancreas: No mass, inflammatory changes, or other significant abnormality. Spleen: Spleen is enlarged consistent with underlying portal hypertension related to the ascites. A spontaneous splenorenal shunt is noted. Perisplenic varices are noted. No definitive esophageal or perigastric varices are seen. Adrenals/Urinary Tract: The adrenal glands are within normal limits. Kidney show no renal calculi or obstructive changes. Delayed images demonstrate normal excretion of contrast. Bladder is within normal limits. Stomach/Bowel: No evidence of obstruction, inflammatory process, or abnormal fluid collections. The appendix is not visualize consistent with a prior surgical history. Vascular/Lymphatic: Aortic calcifications are noted  without aneurysmal dilatation. Considerable increase perirectal venous engorgement is noted consistent with the portal hypertension. No significant lymphadenopathy is identified. Reproductive: No mass or other significant abnormality. Other: An abdominal wall hernia is noted in the periumbilical region containing a small amount of omental fat and ascites. Mild moderate ascites is noted. Musculoskeletal:  No suspicious bone lesions identified. IMPRESSION: Changes consistent with cirrhosis of  the liver and portal hypertension with ascites and significant increased perirectal varices. Cholelithiasis without complicating factors. Anterior abdominal wall hernia Electronically Signed   By: Inez Catalina M.D.   On: 01/10/2016 13:46   US Paracentesis  Result Date: 01/10/2016 INDICATION: Ascites and fever. Mild abdominal pain. Diagnostic paracentesis for spontaneous bacterial peritonitis EXAM: ULTRASOUND GUIDED  PARACENTESIS MEDICATIONS: None. COMPLICATIONS: None immediate. PROCEDURE: Informed written consent was obtained from the patient after a discussion of the risks, benefits and alternatives to treatment. A timeout was performed prior to the initiation of the procedure. Initial ultrasound scanning demonstrates a small amount of ascites distributed throughout the abdomen. After discussion with ordering provider, need for diagnostic sampling was apparent. The status pocket is within an umbilical hernia which head no precluding internal vessels or bowel. There was a small scab on the umbilical hernia skin, but no surrounding cellulitic changes and this area was generously avoided. The hernia was prepped and draped in the usual sterile fashion. Using sonographic guidance hernia was punctured with a 22 gauge needle and 30 cc of straw colored fluid was aspirated. An ultrasound image was saved for documentation purposes. A dressing was applied. The patient tolerated the procedure well without immediate post procedural  complication. FINDINGS: A total of approximately 30 cc of straw-colored fluid was removed. Samples were sent to the laboratory as requested by the clinical team. IMPRESSION: Successful ultrasound-guided diagnostic paracentesis. Electronically Signed   By: Monte Fantasia M.D.   On: 01/10/2016 15:39    EKG: Independently reviewed. Sinus rhythm, no acute ischemic abnormalities  Assessment/Plan Principal Problem:   SBP (spontaneous bacterial peritonitis) (HCC) Active Problems:   Cirrhosis, alcoholic (HCC)   Pancytopenia (HCC)   Esophageal varices in alcoholic cirrhosis ([facility]CC)   Diarrhea    Abdominal pain/diarrhea -With findings of paracentesis, believed diagnosis is SBP. -Start Rocephin 2 g daily which he will need to continue for at least 5 days, would suggest adding prophylactic weekly Cipro at time of discharge. -Given he also has diarrhea and one of his home contacts has C. difficile, this may also be part of his abdominal pain. C. difficile panel and GI panel has been requested. -We'll request GI consultation in a.m.  Pancytopenia -Due to cirrhosis, avoid heparin products.  Alcoholic cirrhosis  -With ascites, continue propranolol given esophageal varices. -Continue Lasix/spironolactone.  Insulin-dependent diabetes -Continue long-acting insulin, add sliding scale, check hemoglobin A1c   DVT prophylaxis: SCDs  Code Status: Full code  Family Communication: Daughter at bedside updated on plan of care and all questions answered  Disposition Plan: We'll need to remain in hospital for at least 5 days to complete treatment for SPEP  Consults called: GI  Admission status: Inpatient    Time Spent: 75 minutes  Lelon Frohlich MD [facility] Pager [phone]  If 7PM-7AM, please contact night-coverage [email or web address] Password [facility]  01/10/2016, 6:35 PM

## 2016-01-11 ENCOUNTER — Encounter (HOSPITAL_COMMUNITY): Payer: Self-pay | Admitting: *Deleted

## 2016-01-11 DIAGNOSIS — A030 Shigellosis due to Shigella dysenteriae: Secondary | ICD-10-CM

## 2016-01-11 DIAGNOSIS — R188 Other ascites: Secondary | ICD-10-CM

## 2016-01-11 DIAGNOSIS — R197 Diarrhea, unspecified: Secondary | ICD-10-CM

## 2016-01-11 DIAGNOSIS — K7031 Alcoholic cirrhosis of liver with ascites: Secondary | ICD-10-CM

## 2016-01-11 DIAGNOSIS — K652 Spontaneous bacterial peritonitis: Secondary | ICD-10-CM

## 2016-01-11 LAB — CBC
HCT: 24 % — ABNORMAL LOW (ref 39.0–52.0)
Hemoglobin: 7.9 g/dL — ABNORMAL LOW (ref 13.0–17.0)
MCH: 30.9 pg (ref 26.0–34.0)
MCHC: 32.9 g/dL (ref 30.0–36.0)
MCV: 93.8 fL (ref 78.0–100.0)
Platelets: 123 10*3/uL — ABNORMAL LOW (ref 150–400)
RBC: 2.56 MIL/uL — ABNORMAL LOW (ref 4.22–5.81)
RDW: 17 % — ABNORMAL HIGH (ref 11.5–15.5)
WBC: 3.1 10*3/uL — ABNORMAL LOW (ref 4.0–10.5)

## 2016-01-11 LAB — GASTROINTESTINAL PANEL BY PCR, STOOL (REPLACES STOOL CULTURE)
Adenovirus F40/41: NOT DETECTED
Astrovirus: NOT DETECTED
Campylobacter species: NOT DETECTED
Cryptosporidium: NOT DETECTED
Cyclospora cayetanensis: NOT DETECTED
E. coli O157: NOT DETECTED
Entamoeba histolytica: NOT DETECTED
Enteroaggregative E coli (EAEC): NOT DETECTED
Enteropathogenic E coli (EPEC): DETECTED — AB
Enterotoxigenic E coli (ETEC): NOT DETECTED
Giardia lamblia: NOT DETECTED
Norovirus GI/GII: NOT DETECTED
Plesimonas shigelloides: DETECTED — AB
Rotavirus A: NOT DETECTED
Salmonella species: NOT DETECTED
Sapovirus (I, II, IV, and V): NOT DETECTED
Shiga like toxin producing E coli (STEC): NOT DETECTED
Shigella/Enteroinvasive E coli (EIEC): NOT DETECTED
Vibrio cholerae: NOT DETECTED
Vibrio species: NOT DETECTED
Yersinia enterocolitica: NOT DETECTED

## 2016-01-11 LAB — GLUCOSE, CAPILLARY
Glucose-Capillary: 43 mg/dL — CL (ref 65–99)
Glucose-Capillary: 48 mg/dL — ABNORMAL LOW (ref 65–99)
Glucose-Capillary: 81 mg/dL (ref 65–99)
Glucose-Capillary: 159 mg/dL — ABNORMAL HIGH (ref 65–99)
Glucose-Capillary: 171 mg/dL — ABNORMAL HIGH (ref 65–99)
Glucose-Capillary: 146 mg/dL — ABNORMAL HIGH (ref 65–99)

## 2016-01-11 LAB — BLOOD CULTURE ID PANEL (REFLEXED)
Acinetobacter baumannii: NOT DETECTED
Candida albicans: NOT DETECTED
Candida glabrata: NOT DETECTED
Candida krusei: NOT DETECTED
Candida parapsilosis: NOT DETECTED
Candida tropicalis: NOT DETECTED
Carbapenem resistance: NOT DETECTED
Enterobacter cloacae complex: NOT DETECTED
Enterobacteriaceae species: NOT DETECTED
Enterococcus species: NOT DETECTED
Escherichia coli: NOT DETECTED
Haemophilus influenzae: NOT DETECTED
Klebsiella oxytoca: NOT DETECTED
Klebsiella pneumoniae: NOT DETECTED
Listeria monocytogenes: NOT DETECTED
Methicillin resistance: NOT DETECTED
Neisseria meningitidis: NOT DETECTED
Proteus species: NOT DETECTED
Pseudomonas aeruginosa: NOT DETECTED
Serratia marcescens: NOT DETECTED
Staphylococcus aureus (BCID): NOT DETECTED
Staphylococcus species: DETECTED — AB
Streptococcus agalactiae: NOT DETECTED
Streptococcus pneumoniae: NOT DETECTED
Streptococcus pyogenes: NOT DETECTED
Streptococcus species: NOT DETECTED
Vancomycin resistance: NOT DETECTED

## 2016-01-11 LAB — COMPREHENSIVE METABOLIC PANEL
ALT: 19 U/L (ref 17–63)
AST: 31 U/L (ref 15–41)
Albumin: 2.8 g/dL — ABNORMAL LOW (ref 3.5–5.0)
Alkaline Phosphatase: 69 U/L (ref 38–126)
Anion gap: 7 (ref 5–15)
BUN: 10 mg/dL (ref 6–20)
CO2: 24 mmol/L (ref 22–32)
Calcium: 8.4 mg/dL — ABNORMAL LOW (ref 8.9–10.3)
Chloride: 98 mmol/L — ABNORMAL LOW (ref 101–111)
Creatinine, Ser: 0.76 mg/dL (ref 0.61–1.24)
GFR calc Af Amer: >60 mL/min (ref >60)
GFR calc non Af Amer: >60 mL/min (ref >60)
Glucose, Bld: 63 mg/dL — ABNORMAL LOW (ref 65–99)
Potassium: 3.3 mmol/L — ABNORMAL LOW (ref 3.5–5.1)
Sodium: 129 mmol/L — ABNORMAL LOW (ref 135–145)
Total Bilirubin: 0.8 mg/dL (ref 0.3–1.2)
Total Protein: 6.3 g/dL — ABNORMAL LOW (ref 6.5–8.1)

## 2016-01-11 MED ORDER — GLUCOSE 40 % PO GEL
ORAL | Status: AC
  Administered 2016-01-11: 1
  Filled 2016-01-11: qty 1

## 2016-01-11 MED ORDER — POTASSIUM CHLORIDE CRYS ER 20 MEQ PO TBCR
40.0000 meq | EXTENDED_RELEASE_TABLET | Freq: Once | ORAL | Status: AC
  Administered 2016-01-11: 40 meq via ORAL
  Filled 2016-01-11: qty 2

## 2016-01-11 NOTE — Progress Notes (Signed)
Notified midlevel coverage via text page of positive blood culture ID.  Positive for Staphylococcus species.

## 2016-01-11 NOTE — Progress Notes (Signed)
Patient had a blood sugar of 41, glucose gel given and blood sugar increased to 81. Patient sitting up in bed eating breakfast.  Lab called and stated that patient's blood culture aerobic tube grew gram + cocci. MD text and informed of lab values

## 2016-01-11 NOTE — Consult Note (Signed)
Referring Provider: No ref. provider found Primary Care Physician:  Monico Blitz, MD Primary Gastroenterologist:  Dr. Laural Golden  Reason for Consultation:    Abdominal pain and diarrhea in patient with advanced liver disease. SBP suspected.  HPI:   Patient is 70 year old Caucasian male with known alcoholic cirrhosis complicated by ascites who was in usual state of health until 3 days prior to admission when he developed copious watery diarrhea along with abdominal pain and low-grade fever. He thought he had self-limiting and less but he just did not get better. Patient states he lost 9 pounds in matter a few days as he lost his appetite and felt nauseated and unable to eat. He did not experience vomiting hematemesis melena or rectal bleeding. He denies eating raw fish. Patient had abdominal tap revealing WBC of 409 and 10% neutrophils. Cultures have remained negative. Patient was begun on ceftriaxone. He feels better. Today he has had three loose stools. His appetite has improved. He is not having any abdominal pain at the present time.  Patient states he has not had any alcohol in over 2-1/2 years. He quit cigarette smoking over 25 years ago. He smoked less than a pack a day. 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 He has 5 children from 2 previous marriages. They're in good health. First two marriages ended in divorce. His girlfriend lives with him.   Past Medical History:  Diagnosis Date  . Alcoholic cirrhosis ([facility])    patient reports completing hep a and b vaccines in 2006  . Anemia    has had 3 units of prbcs 2013  . Anxiety   . Asymptomatic gallstones    Ultrasound in 2006  . Depression   . Diabetes ([facility])   . Duodenal ulcer with hemorrhage    per patient in 2006 or 2007, records have been requested  . 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 History of alcohol abuse    quit in 2006  . Hypertension   . Splenomegaly    Ultrasound in 2006  . Tubular  adenoma of colon     Past Surgical History:  Procedure Laterality Date  . APPENDECTOMY    . COLONOSCOPY  03/10/2005   Rectal polyp as described above, removed with snare. Left sided  diverticula. The remainder of the colonic mucosa appeared normal. Inflammed polyp on path.  . COLONOSCOPY  04/1999   Dr. Thea Silversmith polyps removed,  Path showed hyperplastic  . COLONOSCOPY WITH ESOPHAGOGASTRODUODENOSCOPY (EGD)  03/31/2012   [id number] AVMs. Colonic diverticulosis. tubular adenoma colon  . ESOPHAGOGASTRODUODENOSCOPY  01/15/2005   Three columns grade 1 to 2 esophageal varices, otherwise normal esophageal mucosa.  Esophagus was not manipulated otherwise./Nodularity of the antrum with overlying erosions, nonspecific finding. Path showed rare H.pylori  . ESOPHAGOGASTRODUODENOSCOPY  09/2008   Dr. Gaylene Brooks columns of grade 2-3 esoph varices, only one column was prominent. Portal gastropathy, multiple gastrc polyps at antrum, two were 2cm with black eschar, bulbar polyps, bulbar erosions  . ESOPHAGOGASTRODUODENOSCOPY  11/2004   Dr. Brantley Stage 3 esoph varices  . ESOPHAGOGASTRODUODENOSCOPY  03/31/2012   RMR: 4 columns(3-GR 2, 1-GR1) non-bleeding esophageal varices, portal gastropathy, small HH, early GAVE, multiple gastric polyps   . GASTRIC VARICES BANDING  03/31/2012   Procedure: GASTRIC VARICES BANDING;  Surgeon: Daneil Dolin, MD;  Location: [facility];  Service: Endoscopy;  Laterality: N/A;    Prior to Admission medications   Medication Sig Start Date End Date Taking? Authorizing Provider  ALPHA LIPOIC ACID PO Take 1 tablet by mouth daily.    Yes Historical Provider, MD  Ascorbic Acid (VITAMIN C) 1000 MG tablet Take 1,000 mg by mouth daily.   Yes Historical Provider, MD  Cholecalciferol (VITAMIN D-3 PO) Take 1 tablet by mouth daily.    Yes Historical Provider, MD  Chromium Picolinate (CHROMIUM PICOLATE) 1000 MCG TABS Take 1 tablet by mouth daily.    Yes Historical Provider, MD   Cinnamon 500 MG capsule Take 500 mg by mouth daily.   Yes Historical Provider, MD  Cyanocobalamin (VITAMIN B 12 PO) Take 1 tablet by mouth daily.    Yes Historical Provider, MD  cyclobenzaprine (FLEXERIL) 10 MG tablet Take 10 mg by mouth daily.  02/03/12  Yes Historical Provider, MD  Flaxseed, Linseed, (FLAX SEED OIL) 1300 MG CAPS Take 1 capsule by mouth daily.    Yes Historical Provider, MD  FLUoxetine (PROZAC) 20 MG capsule Take 20 mg by mouth daily.  02/23/12  Yes Historical Provider, MD  furosemide (LASIX) 40 MG tablet Take 40 mg by mouth daily.    Yes Historical Provider, MD  gabapentin (NEURONTIN) 100 MG capsule Take 100 mg by mouth 3 (three) times daily.   Yes Historical Provider, MD  Garlic [id number] MG CAPS Take 1 capsule by mouth daily.    Yes Historical Provider, MD  glipiZIDE (GLUCOTROL) 10 MG tablet Take 10 mg by mouth 2 (two) times daily before a meal.  02/16/12  Yes Historical Provider, MD  HYDROcodone-acetaminophen (NORCO) 10-325 MG tablet Take 1 tablet by mouth every 6 (six) hours as needed.   Yes Historical Provider, MD  iron polysaccharides (NIFEREX) 150 MG capsule Take 150 mg by mouth daily.   Yes Historical Provider, MD  JANUVIA 100 MG tablet Take 100 mg by mouth daily.  08/28/12  Yes Historical Provider, MD  LEVEMIR FLEXPEN 100 UNIT/ML injection Inject 80 Units into the skin at bedtime.  03/04/12  Yes Historical Provider, MD  lisinopril (PRINIVIL,ZESTRIL) 10 MG tablet Take 10 mg by mouth daily.   Yes Historical Provider, MD  Lutein 10 MG TABS Take 1 tablet by mouth daily.    Yes Historical Provider, MD  metFORMIN (GLUCOPHAGE) 1000 MG tablet Take 1,000 mg by mouth 2 (two) times daily with a meal.  02/23/12  Yes Historical Provider, MD  milk thistle 175 MG tablet Take 175 mg by mouth daily.   Yes Historical Provider, MD  Multiple Vitamin (MULTIVITAMIN) capsule Take 1 capsule by mouth daily.   Yes Historical Provider, MD  NOVOLOG FLEXPEN 100 UNIT/ML injection Inject 30 Units into the skin  every evening.  02/05/12  Yes Historical Provider, MD  omeprazole (PRILOSEC) 20 MG capsule TAKE 1 CAPSULE BY MOUTH EVERY DAY Patient taking differently: TAKE 1 CAPSULE BY MOUTH EVERY DAY 40 mg 05/16/14  Yes Carlis Stable, NP  pravastatin (PRAVACHOL) 20 MG tablet Take 20 mg by mouth daily.   Yes Historical Provider, MD  propranolol (INDERAL) 40 MG tablet TAKE 1 TABLET BY MOUTH TWICE DAILY 11/05/15  Yes Carlis Stable, NP  ranitidine (ZANTAC) 150 MG tablet Take 150 mg by mouth 2 (two) times daily.  03/03/12  Yes Historical Provider, MD  spironolactone (ALDACTONE) 25 MG tablet Take 25 mg by mouth daily.   Yes Historical Provider, MD  tamsulosin (FLOMAX) 0.4 MG CAPS capsule Take 0.4 mg by mouth.   Yes Historical Provider, MD  Thiamine HCl (VITAMIN B-1) 250 MG tablet Take 250 mg by mouth daily.   Yes  Historical Provider, MD  triamcinolone cream (KENALOG) 0.1 % Apply 1 application topically 2 (two) times daily.   Yes Historical Provider, MD  venlafaxine (EFFEXOR) 37.5 MG tablet Take 37.5 mg by mouth 2 (two) times daily.   Yes Historical Provider, MD  vitamin E 400 UNIT capsule Take 400 Units by mouth daily.   Yes Historical Provider, MD    Current Facility-Administered Medications  Medication Dose Route Frequency Provider Last Rate Last Dose  . 0.9 %  sodium chloride infusion  250 mL Intravenous PRN Erline Hau, MD      . cefTRIAXone (ROCEPHIN) 2 g in dextrose 5 % 50 mL IVPB  2 g Intravenous Q24H Erline Hau, MD   2 g at 01/10/16 2158  . cyclobenzaprine (FLEXERIL) tablet 10 mg  10 mg Oral Daily Erline Hau, MD   10 mg at 01/11/16 0913  . feeding supplement (ENSURE ENLIVE) (ENSURE ENLIVE) liquid 237 mL  237 mL Oral BID BM Estela Leonie Green, MD   237 mL at 01/11/16 1502  . furosemide (LASIX) tablet 40 mg  40 mg Oral Daily Erline Hau, MD   40 mg at 01/11/16 0913  . gabapentin (NEURONTIN) capsule 100 mg  100 mg Oral TID Erline Hau, MD    100 mg at 01/11/16 1502  . glipiZIDE (GLUCOTROL) tablet 10 mg  10 mg Oral BID AC Estela Leonie Green, MD   10 mg at 01/10/16 1901  . insulin aspart (novoLOG) injection 0-5 Units  0-5 Units Subcutaneous QHS Erline Hau, MD      . insulin aspart (novoLOG) injection 0-9 Units  0-9 Units Subcutaneous TID WC Estela Leonie Green, MD   2 Units at 01/11/16 1148  . insulin aspart (novoLOG) injection 3 Units  3 Units Subcutaneous TID WC Estela Leonie Green, MD      . insulin detemir (LEVEMIR) injection 80 Units  80 Units Subcutaneous QHS Erline Hau, MD   80 Units at 01/10/16 2157  . iron polysaccharides (NIFEREX) capsule 150 mg  150 mg Oral Daily Erline Hau, MD   150 mg at 01/11/16 0913  . lisinopril (PRINIVIL,ZESTRIL) tablet 10 mg  10 mg Oral Daily Erline Hau, MD   10 mg at 01/11/16 0913  . multivitamin with minerals tablet 1 tablet  1 tablet Oral Daily Erline Hau, MD   1 tablet at 01/11/16 0912  . ondansetron (ZOFRAN) tablet 4 mg  4 mg Oral Q6H PRN Erline Hau, MD       Or  . ondansetron [facility]) injection 4 mg  4 mg Intravenous Q6H PRN Erline Hau, MD   4 mg at 01/10/16 2106  . oxyCODONE (Oxy IR/ROXICODONE) immediate release tablet 5 mg  5 mg Oral Q4H PRN Erline Hau, MD      . pantoprazole (PROTONIX) EC tablet 40 mg  40 mg Oral Daily Erline Hau, MD   40 mg at 01/11/16 0913  . pravastatin (PRAVACHOL) tablet 20 mg  20 mg Oral q1800 Erline Hau, MD   20 mg at 01/10/16 2108  . propranolol (INDERAL) tablet 40 mg  40 mg Oral BID Erline Hau, MD   40 mg at 01/11/16 0913  . sodium chloride flush (NS) 0.9 % injection 3 mL  3 mL Intravenous Q12H Estela Leonie Green, MD   3 mL  at 01/11/16 1000  . sodium chloride flush (NS) 0.9 % injection 3 mL  3 mL Intravenous PRN Erline Hau, MD      . spironolactone (ALDACTONE) tablet  25 mg  25 mg Oral Daily Erline Hau, MD   25 mg at 01/11/16 0913  . tamsulosin (FLOMAX) capsule 0.4 mg  0.4 mg Oral QPC supper Erline Hau, MD   0.4 mg at 01/10/16 1900  . thiamine (VITAMIN B-1) tablet 250 mg  250 mg Oral Daily Erline Hau, MD   250 mg at 01/11/16 0913  . venlafaxine (EFFEXOR) tablet 37.5 mg  37.5 mg Oral BID Erline Hau, MD   37.5 mg at 01/11/16 0913    Allergies as of 01/10/2016  . (No Known Allergies)    Family History  Problem Relation Age of Onset  . Colon cancer Father 60    still living, age 58  . Breast cancer Sister     deceased  . Breast cancer Sister   . Lung cancer Neg Hx   . Ovarian cancer Neg Hx     Social History   Social History  . Marital status: Divorced    Spouse name: N/A  . Number of children: 2  . Years of education: N/A   Occupational History  . Disabled Self Employed   Social History Main Topics  . Smoking status: Former Smoker    Packs/day: 0.50    Types: Cigarettes  . Smokeless tobacco: Never Used     Comment: qiut about 25+ years  . Alcohol use No     Comment: quit in 2006  . Drug use: No  . Sexual activity: Not Currently   Other Topics Concern  . Not on file   Social History Narrative   Two step children from second marriage (divorced) who live with him along with some grandchildren.     Review of Systems: See HPI, otherwise normal ROS  Physical Exam: Temp:  [98.2 F (36.8 C)-100.2 F (37.9 C)] 98.6 F (37 C) (08/12 1459) Pulse Rate:  [86-105] 86 (08/12 1459) Resp:  [18-20] 18 (08/12 1459) BP: (117-141)/(54-69) 117/54 (08/12 1459) SpO2:  [93 %-98 %] 93 % (08/12 1459) Weight:  [223 lb 3.2 oz (101.2 kg)] 223 lb 3.2 oz (101.2 kg) (08/11 1808) Last BM Date: 01/10/16  Patient is alert and in no acute distress. Conjunctiva is pink. Sclerae nonicteric. Oropharyngeal mucosa is normal. He has complete upper and partial lower dental plates. No neck masses or  thyromegaly noted. Cardiac exam with regular rhythm normal S1 and S2. No murmur or gallop noted. Abdomen is distended with with large umblical hernia but the size of an orange with pigmented thin skin and superficial ulcer. Bowel sounds normal. On palpation abdomen is soft. Flanks are dull but no shifting noted. No organomegaly or masses. No peripheral edema or clubbing noted.   Lab Results:  Recent Labs  01/10/16 1045 01/11/16 0633  WBC 2.7* 3.1*  HGB 8.4* 7.9*  HCT 25.2* 24.0*  PLT 115* 123*   BMET  Recent Labs  01/10/16 1045 01/11/16 0633  NA 126* 129*  K 4.0 3.3*  CL 91* 98*  CO2 25 24  GLUCOSE 108* 63*  BUN 13 10  CREATININE 0.84 0.76  CALCIUM 8.4* 8.4*   LFT  Recent Labs  01/11/16 0633  PROT 6.3*  ALBUMIN 2.8*  AST 31  ALT 19  ALKPHOS 69  BILITOT 0.8   PT/INR  Recent Labs  01/10/16 1421  LABPROT 14.7  INR 1.15   Hepatitis Panel No results for input(s): HEPBSAG, HCVAB, HEPAIGM, HEPBIGM in the last 72 hours.  Studies/Results: Dg Chest 2 View  Result Date: 01/10/2016 CLINICAL DATA:  Nausea, vomiting, diarrhea, and fever for 3 days. Received blood last Friday because of bleeding polyps. 10/02/2013 EXAM: CHEST  2 VIEW COMPARISON:  10/02/2013 FINDINGS: Shallow lung inflation. Heart size is enlarged. There are no focal consolidations or pleural effusions. Moderate mid thoracic spondylosis noted. No free air beneath the diaphragm. IMPRESSION: No evidence for acute cardiopulmonary abnormality. Electronically Signed   By: Nolon Nations M.D.   On: 01/10/2016 12:55   Ct Abdomen Pelvis W Contrast  Result Date: 01/10/2016 CLINICAL DATA:  Nausea and diarrhea EXAM: CT ABDOMEN AND PELVIS WITH CONTRAST TECHNIQUE: Multidetector CT imaging of the abdomen and pelvis was performed using the standard protocol following bolus administration of intravenous contrast. CONTRAST:  125mL ISOVUE-300 IOPAMIDOL (ISOVUE-300) INJECTION 61% COMPARISON:  09/02/2015 FINDINGS: Lower  chest:  No acute findings. Hepatobiliary: Changes of underlying cirrhosis are noted. Differential enhancement is noted related to the underlying cirrhosis. No definitive mass lesion is seen. Gallbladder is well distended with multiple gallstones. Pancreas: No mass, inflammatory changes, or other significant abnormality. Spleen: Spleen is enlarged consistent with underlying portal hypertension related to the ascites. A spontaneous splenorenal shunt is noted. Perisplenic varices are noted. No definitive esophageal or perigastric varices are seen. Adrenals/Urinary Tract: The adrenal glands are within normal limits. Kidney show no renal calculi or obstructive changes. Delayed images demonstrate normal excretion of contrast. Bladder is within normal limits. Stomach/Bowel: No evidence of obstruction, inflammatory process, or abnormal fluid collections. The appendix is not visualize consistent with a prior surgical history. Vascular/Lymphatic: Aortic calcifications are noted without aneurysmal dilatation. Considerable increase perirectal venous engorgement is noted consistent with the portal hypertension. No significant lymphadenopathy is identified. Reproductive: No mass or other significant abnormality. Other: An abdominal wall hernia is noted in the periumbilical region containing a small amount of omental fat and ascites. Mild moderate ascites is noted. Musculoskeletal:  No suspicious bone lesions identified. IMPRESSION: Changes consistent with cirrhosis of the liver and portal hypertension with ascites and significant increased perirectal varices. Cholelithiasis without complicating factors. Anterior abdominal wall hernia Electronically Signed   By: Inez Catalina M.D.   On: 01/10/2016 13:46   US Paracentesis  Result Date: 01/10/2016 INDICATION: Ascites and fever. Mild abdominal pain. Diagnostic paracentesis for spontaneous bacterial peritonitis EXAM: ULTRASOUND GUIDED  PARACENTESIS MEDICATIONS: None. COMPLICATIONS:  None immediate. PROCEDURE: Informed written consent was obtained from the patient after a discussion of the risks, benefits and alternatives to treatment. A timeout was performed prior to the initiation of the procedure. Initial ultrasound scanning demonstrates a small amount of ascites distributed throughout the abdomen. After discussion with ordering provider, need for diagnostic sampling was apparent. The status pocket is within an umbilical hernia which head no precluding internal vessels or bowel. There was a small scab on the umbilical hernia skin, but no surrounding cellulitic changes and this area was generously avoided. The hernia was prepped and draped in the usual sterile fashion. Using sonographic guidance hernia was punctured with a 22 gauge needle and 30 cc of straw colored fluid was aspirated. An ultrasound image was saved for documentation purposes. A dressing was applied. The patient tolerated the procedure well without immediate post procedural complication. FINDINGS: A total of approximately 30 cc of straw-colored fluid was removed. Samples were sent to the laboratory as requested  by the clinical team. IMPRESSION: Successful ultrasound-guided diagnostic paracentesis. Electronically Signed   By: Monte Fantasia M.D.   On: 01/10/2016 15:39    Assessment;  Patient is 70 year old Caucasian male with history of various alcoholic cirrhosis who presents with 3 day history of copious nonbloody diarrhea abdominal pain and low-grade fever and suspected to have SBP. Diagnostic abdominal paracenteses revealed WBC of 409 and neutrophil count of 10% or an absolute count of 41. Patient has responded to IV ceftriaxone. In the meantime GI pathogen panel significant for Plesimonas Shigellodes and enteropathic Escherichia coli. Therefore patient's acute illness appears to be secondary to enteric infection and not due to SBP. Neutrophilic cell count is well below the threshold of 250 neutrophils to make a  diagnosis of SBP.  Anemia appears to be due to chronic disease. Hemoglobin may have dropped some because of acute illness. Hemoglobin in November 2016 was 9.4. His stool is guaiac negative. Therefore no evidence of GI bleed.  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  Patient has fairly large umbilicus/barium likely hernia with thinning of skin and superficial ulceration. He should consider having this fixed at some point. If it ruptures it might be catastrophic.  Recommendations;  Consider switching antibiotics to by mouth Cipro tomorrow. Check serum albumin with a.m. lab.    LOS: 1 day   Mischell Branford  01/11/2016, 4:04 PM

## 2016-01-11 NOTE — Progress Notes (Signed)
PROGRESS NOTE    Jeffrey [name]  [id number] DOB: 1945/09/30 DOA: 01/10/2016 PCP: Monico Blitz, MD     Brief Narrative:  70 y/o man with alcoholic cirrhosis here with abdominal pain and diarrhea   Assessment & Plan:   Principal Problem:   SBP (spontaneous bacterial peritonitis) ([place]) Active Problems:   Cirrhosis, alcoholic ([place])   Pancytopenia (HCC)   Esophageal varices in alcoholic cirrhosis (H[facility])   Diarrhea   Shigella dysenteriae   Abdominal Pain/Diarrhea -Initially thought to have SBP, but stool panel has resulted with shigella and enteropathogenic E Coli. -Continue rocephin today, can likely transition to Cipro in am.  Pancytopenia -Due to cirrhosis, avoid heparin products.  Alcoholic cirrhosis  -With ascites, continue propranolol given esophageal varices. -Continue Lasix/spironolactone.  Insulin-dependent diabetes -Continue long-acting insulin, add sliding scale, check hemoglobin A1c  DVT prophylaxis: SCDs Code Status: Full Code Family Communication: Patient only Disposition Plan: Likely DC home in 24-48 hours  Consultants:   GI  Procedures:   None  Antimicrobials:   Rocephin    Subjective: Feels better, less diarrhea  Objective: Vitals:   01/10/16 1946 01/10/16 2102 01/11/16 0530 01/11/16 1459  BP:  124/65 131/63 (!) 117/54  Pulse:  (!) 101 90 86  Resp:  20 18 18   Temp:  99.1 F (37.3 C) 98.2 F (36.8 C) 98.6 F (37 C)  TempSrc:  Oral Oral Oral  SpO2: 95% 93% 96% 93%  Weight:      Height:        Intake/Output Summary (Last 24 hours) at 01/11/16 1806 Last data filed at 01/11/16 0347  Gross per 24 hour  Intake               50 ml  Output                0 ml  Net               50 ml   Filed Weights   01/10/16 1039 01/10/16 1808  Weight: 101.6 kg (224 lb) 101.2 kg (223 lb 3.2 oz)    Examination:  General exam: Alert, awake, oriented x 3 Respiratory system: Clear to auscultation. Respiratory effort normal. Cardiovascular  system:RRR. No murmurs, rubs, gallops. Gastrointestinal system: Abdomen is nondistended, soft and nontender. No organomegaly or masses felt. Normal bowel sounds heard. Central nervous system: Alert and oriented. No focal neurological deficits. Extremities: No C/C/E, +pedal pulses Skin: No rashes, lesions or ulcers Psychiatry: Judgement and insight appear normal. Mood & affect appropriate.     Data Reviewed: I have personally reviewed following labs and imaging studies  CBC:  Recent Labs Lab 01/10/16 1045 01/11/16 0633  WBC 2.7* 3.1*  HGB 8.4* 7.9*  HCT 25.2* 24.0*  MCV 94.0 93.8  PLT 115* [id number]*   Basic Metabolic Panel:  Recent Labs Lab 01/10/16 1045 01/11/16 0633  NA 126* 129*  K 4.0 3.3*  CL 91* 98*  CO2 25 24  GLUCOSE 108* 63*  BUN 13 10  CREATININE 0.84 0.76  CALCIUM 8.4* 8.4*   GFR: Estimated Creatinine Clearance: 96.8 mL/min (by C-G formula based on SCr of 0.8 mg/dL). Liver Function Tests:  Recent Labs Lab 01/10/16 1045 01/11/16 0633  AST 31 31  ALT 23 19  ALKPHOS 77 69  BILITOT 1.3* 0.8  PROT 6.8 6.3*  ALBUMIN 3.1* 2.8*    Recent Labs Lab 01/10/16 1045  LIPASE 13   No results for input(s): AMMONIA in the last 168 hours. Coagulation Profile:  Recent Labs  Lab 01/10/16 1421  INR 1.15   Cardiac Enzymes:  Recent Labs Lab 01/10/16 1225  TROPONINI <0.03   BNP (last 3 results) No results for input(s): PROBNP in the last 8760 hours. HbA1C: No results for input(s): HGBA1C in the last 72 hours. CBG:  Recent Labs Lab 01/11/16 0753 01/11/16 0812 01/11/16 0842 01/11/16 1114 01/11/16 1645  GLUCAP 43* 48* 81 159* 171*   Lipid Profile: No results for input(s): CHOL, HDL, LDLCALC, TRIG, CHOLHDL, LDLDIRECT in the last 72 hours. Thyroid Function Tests: No results for input(s): TSH, T4TOTAL, FREET4, T3FREE, THYROIDAB in the last 72 hours. Anemia Panel: No results for input(s): VITAMINB12, FOLATE, FERRITIN, TIBC, IRON, RETICCTPCT in the  last 72 hours. Urine analysis:    Component Value Date/Time   COLORURINE YELLOW 01/10/2016 1110   APPEARANCEUR CLEAR 01/10/2016 1110   LABSPEC 1.010 01/10/2016 1110   PHURINE 6.0 01/10/2016 1110   GLUCOSEU NEGATIVE 01/10/2016 1110   HGBUR NEGATIVE 01/10/2016 1110   BILIRUBINUR NEGATIVE 01/10/2016 1110   KETONESUR NEGATIVE 01/10/2016 1110   PROTEINUR TRACE (A) 01/10/2016 1110   NITRITE NEGATIVE 01/10/2016 1110   LEUKOCYTESUR NEGATIVE 01/10/2016 1110   Sepsis Labs: @LABRCNTIP (procalcitonin:4,lacticidven:4)  ) Recent Results (from the past 240 hour(s))  Culture, blood (routine x 2)     Status: None   Collection Time: 01/10/16 11:44 AM  Result Value Ref Range Status   Specimen Description BLOOD  Final   Special Requests NONE  Final   Culture  Setup Time   Final    GRAM POSITIVE COCCI Gram Stain Report Called to,Read Back By and Verified With: DICKERSON, Jerilynn Mages AT 0835 ON 01/11/2016 BY WOODS,M    Culture NO GROWTH < 24 HOURS  Final   Report Status 01/11/2016 FINAL  Final  Culture, blood (routine x 2)     Status: None (Preliminary result)   Collection Time: 01/10/16 12:24 PM  Result Value Ref Range Status   Specimen Description BLOOD  Final   Special Requests NONE  Final   Culture NO GROWTH < 24 HOURS  Final   Report Status PENDING  Incomplete  Gram stain     Status: None   Collection Time: 01/10/16  3:10 PM  Result Value Ref Range Status   Specimen Description PERITONEAL  Final   Special Requests NONE  Final   Gram Stain   Final    CYTOSPIN SMEAR NO ORGANISMS SEEN WBC PRESENT,BOTH PMN AND MONONUCLEAR   Report Status 01/10/2016 FINAL  Final  Culture, body fluid-bottle     Status: None (Preliminary result)   Collection Time: 01/10/16  3:10 PM  Result Value Ref Range Status   Specimen Description PERITONEAL  Final   Special Requests BOTTLES DRAWN AEROBIC AND ANAEROBIC [place]  Final   Culture NO GROWTH < 24 HOURS  Final   Report Status PENDING  Incomplete  Gastrointestinal  Panel by PCR , Stool     Status: Abnormal   Collection Time: 01/10/16  4:30 PM  Result Value Ref Range Status   Campylobacter species NOT DETECTED NOT DETECTED Final   Plesimonas shigelloides DETECTED (A) NOT DETECTED Final    Comment: CRITICAL RESULT CALLED TO, READ BACK BY AND VERIFIED WITH: Chilton Greathouse RN @ [id number] 01/11/16 by [place]    Salmonella species NOT DETECTED NOT DETECTED Final   Yersinia enterocolitica NOT DETECTED NOT DETECTED Final   Vibrio species NOT DETECTED NOT DETECTED Final   Vibrio cholerae NOT DETECTED NOT DETECTED Final   Enteroaggregative E coli (EAEC) NOT  DETECTED NOT DETECTED Final   Enteropathogenic E coli (EPEC) DETECTED (A) NOT DETECTED Final    Comment: CRITICAL RESULT CALLED TO, READ BACK BY AND VERIFIED WITH: Chilton Greathouse @ 1436 01/11/16 by [facility]    Enterotoxigenic E coli (ETEC) NOT DETECTED NOT DETECTED Final   Shiga like toxin producing E coli (STEC) NOT DETECTED NOT DETECTED Final   E. coli O157 NOT DETECTED NOT DETECTED Final   Shigella/Enteroinvasive E coli (EIEC) NOT DETECTED NOT DETECTED Final   Cryptosporidium NOT DETECTED NOT DETECTED Final   Cyclospora cayetanensis NOT DETECTED NOT DETECTED Final   Entamoeba histolytica NOT DETECTED NOT DETECTED Final   Giardia lamblia NOT DETECTED NOT DETECTED Final   Adenovirus F40/41 NOT DETECTED NOT DETECTED Final   Astrovirus NOT DETECTED NOT DETECTED Final   Norovirus GI/GII NOT DETECTED NOT DETECTED Final   Rotavirus A NOT DETECTED NOT DETECTED Final   Sapovirus (I, II, IV, and V) NOT DETECTED NOT DETECTED Final  C difficile quick scan w PCR reflex     Status: None   Collection Time: 01/10/16  4:30 PM  Result Value Ref Range Status   C Diff antigen NEGATIVE NEGATIVE Final   C Diff toxin NEGATIVE NEGATIVE Final   C Diff interpretation No C. difficile detected.  Final         Radiology Studies: Dg Chest 2 View  Result Date: 01/10/2016 CLINICAL DATA:  Nausea, vomiting, diarrhea, and fever for 3  days. Received blood last Friday because of bleeding polyps. 10/02/2013 EXAM: CHEST  2 VIEW COMPARISON:  10/02/2013 FINDINGS: Shallow lung inflation. Heart size is enlarged. There are no focal consolidations or pleural effusions. Moderate mid thoracic spondylosis noted. No free air beneath the diaphragm. IMPRESSION: No evidence for acute cardiopulmonary abnormality. Electronically Signed   By: Nolon Nations M.D.   On: 01/10/2016 12:55   Ct Abdomen Pelvis W Contrast  Result Date: 01/10/2016 CLINICAL DATA:  Nausea and diarrhea EXAM: CT ABDOMEN AND PELVIS WITH CONTRAST TECHNIQUE: Multidetector CT imaging of the abdomen and pelvis was performed using the standard protocol following bolus administration of intravenous contrast. CONTRAST:  165mL ISOVUE-300 IOPAMIDOL (ISOVUE-300) INJECTION 61% COMPARISON:  09/02/2015 FINDINGS: Lower chest:  No acute findings. Hepatobiliary: Changes of underlying cirrhosis are noted. Differential enhancement is noted related to the underlying cirrhosis. No definitive mass lesion is seen. Gallbladder is well distended with multiple gallstones. Pancreas: No mass, inflammatory changes, or other significant abnormality. Spleen: Spleen is enlarged consistent with underlying portal hypertension related to the ascites. A spontaneous splenorenal shunt is noted. Perisplenic varices are noted. No definitive esophageal or perigastric varices are seen. Adrenals/Urinary Tract: The adrenal glands are within normal limits. Kidney show no renal calculi or obstructive changes. Delayed images demonstrate normal excretion of contrast. Bladder is within normal limits. Stomach/Bowel: No evidence of obstruction, inflammatory process, or abnormal fluid collections. The appendix is not visualize consistent with a prior surgical history. Vascular/Lymphatic: Aortic calcifications are noted without aneurysmal dilatation. Considerable increase perirectal venous engorgement is noted consistent with the portal  hypertension. No significant lymphadenopathy is identified. Reproductive: No mass or other significant abnormality. Other: An abdominal wall hernia is noted in the periumbilical region containing a small amount of omental fat and ascites. Mild moderate ascites is noted. Musculoskeletal:  No suspicious bone lesions identified. IMPRESSION: Changes consistent with cirrhosis of the liver and portal hypertension with ascites and significant increased perirectal varices. Cholelithiasis without complicating factors. Anterior abdominal wall hernia Electronically Signed   By: Linus Mako.D.  On: 01/10/2016 13:46   US Paracentesis  Result Date: 01/10/2016 INDICATION: Ascites and fever. Mild abdominal pain. Diagnostic paracentesis for spontaneous bacterial peritonitis EXAM: ULTRASOUND GUIDED  PARACENTESIS MEDICATIONS: None. COMPLICATIONS: None immediate. PROCEDURE: Informed written consent was obtained from the patient after a discussion of the risks, benefits and alternatives to treatment. A timeout was performed prior to the initiation of the procedure. Initial ultrasound scanning demonstrates a small amount of ascites distributed throughout the abdomen. After discussion with ordering provider, need for diagnostic sampling was apparent. The status pocket is within an umbilical hernia which head no precluding internal vessels or bowel. There was a small scab on the umbilical hernia skin, but no surrounding cellulitic changes and this area was generously avoided. The hernia was prepped and draped in the usual sterile fashion. Using sonographic guidance hernia was punctured with a 22 gauge needle and 30 cc of straw colored fluid was aspirated. An ultrasound image was saved for documentation purposes. A dressing was applied. The patient tolerated the procedure well without immediate post procedural complication. FINDINGS: A total of approximately 30 cc of straw-colored fluid was removed. Samples were sent to the  laboratory as requested by the clinical team. IMPRESSION: Successful ultrasound-guided diagnostic paracentesis. Electronically Signed   By: Monte Fantasia M.D.   On: 01/10/2016 15:39        Scheduled Meds: . cefTRIAXone (ROCEPHIN)  IV  2 g Intravenous Q24H  . cyclobenzaprine  10 mg Oral Daily  . feeding supplement (ENSURE ENLIVE)  237 mL Oral BID BM  . furosemide  40 mg Oral Daily  . gabapentin  100 mg Oral TID  . glipiZIDE  10 mg Oral BID AC  . insulin aspart  0-5 Units Subcutaneous QHS  . insulin aspart  0-9 Units Subcutaneous TID WC  . insulin aspart  3 Units Subcutaneous TID WC  . insulin detemir  80 Units Subcutaneous QHS  . iron polysaccharides  150 mg Oral Daily  . lisinopril  10 mg Oral Daily  . multivitamin with minerals  1 tablet Oral Daily  . pantoprazole  40 mg Oral Daily  . pravastatin  20 mg Oral q1800  . propranolol  40 mg Oral BID  . sodium chloride flush  3 mL Intravenous Q12H  . spironolactone  25 mg Oral Daily  . tamsulosin  0.4 mg Oral QPC supper  . vitamin B-1  250 mg Oral Daily  . venlafaxine  37.5 mg Oral BID   Continuous Infusions:    LOS: 1 day    Time spent: 25 minutes. Greater than 50% of this time was spent in direct contact with the patient coordinating care.     Lelon Frohlich, MD [facility] Pager [phone]  If 7PM-7AM, please contact night-coverage [email or web address] Password [facility] 01/11/2016, 6:06 PM

## 2016-01-11 NOTE — Progress Notes (Signed)
Notified Midlevel coverage of body fluid culture results.  Gram Positive Cocci.

## 2016-01-11 NOTE — Progress Notes (Signed)
Initial Nutrition Assessment  DOCUMENTATION CODES:  Obesity unspecified  INTERVENTION:  Ensure Enlive po BID, each supplement provides 350 kcal and 20 grams of protein  MVI with minerals  Monitor PO intake  NUTRITION DIAGNOSIS:  Increased nutrient needs related to energy requiring chronic illness (cirrhosis) compounded by acute illness as evidenced by estimated nutritional requirements for these conditions.   GOAL:  Patient will meet greater than or equal to 90% of their needs  MONITOR:  PO intake, Supplement acceptance, Labs, Weight trends  REASON FOR ASSESSMENT:  Malnutrition Screening Tool    ASSESSMENT:  70 y/o male pmhx Alcoholic Cirrhosis, etoh abuse, Anxiety, Depression, DM, HTN. Presents with 2 days of diarrhea, 5-7 episodes per day. Has been living w/ homeless man who has Cdiff. SBP found after paracentesis showed elevated WBC.   RD operating remotely. Per chart review, Pts PO intake has not influenced his abdominal pain. There is no direct mention of how his appetite has been affected.  Pt recent weights at OSH  On 6/9 was 227 lbs, and 222 lbs on 5/10. Pt weight appears to have changed very little over years  No documented po intake at this time.   NFPE: Unable to assess Medications: EE bID, lasix 40, glipizide, insulin, mvi, iron, spirinlactone, Thiamin,  Labs reviewed: Hyponatremia resolving, Hypokalemic(lasix), Albumin:2.8, WBC: 3.1, RBC: 2.56, H/H: 7.9/24. CBGs 63-108. A1C pending.    Recent Labs Lab 01/10/16 1045 01/11/16 0633  NA 126* 129*  K 4.0 3.3*  CL 91* 98*  CO2 25 24  BUN 13 10  CREATININE 0.84 0.76  CALCIUM 8.4* 8.4*  GLUCOSE 108* 63*   Diet Order:  Diet heart healthy/carb modified Room service appropriate? Yes; Fluid consistency: Thin  Skin:  Reviewed, no issues  Last BM:  8/11- diarrhea  Height:  Ht Readings from Last 1 Encounters:  01/10/16 5\' 5"  (1.651 m)   Weight:  Wt Readings from Last 1 Encounters:  01/10/16 223 lb 3.2 oz  (101.2 kg)   Wt Readings from Last 10 Encounters:  01/10/16 223 lb 3.2 oz (101.2 kg)  04/24/15 239 lb (108.4 kg)  09/05/12 231 lb (104.8 kg)  05/19/12 233 lb 9.6 oz (106 kg)  03/31/12 232 lb (105.2 kg)  03/09/12 232 lb 3.2 oz (105.3 kg)   Ideal Body Weight:  61.82 kg  BMI:  Body mass index is 37.14 kg/m.  Estimated Nutritional Needs:  Kcal:  1800-2000 kcals (18-20 kcal/kg bw) Protein:  93-105 g Pro (1.5-1.7 g/kg ibw) Fluid:  1.8 liters  EDUCATION NEEDS:  No education needs identified at this time  Burtis Junes RD, LDN, [place] [facility] Pager: [phone] 01/11/2016 9:44 AM

## 2016-01-12 DIAGNOSIS — K652 Spontaneous bacterial peritonitis: Secondary | ICD-10-CM

## 2016-01-12 LAB — GLUCOSE, CAPILLARY
Glucose-Capillary: 137 mg/dL — ABNORMAL HIGH (ref 65–99)
Glucose-Capillary: 222 mg/dL — ABNORMAL HIGH (ref 65–99)
Glucose-Capillary: 141 mg/dL — ABNORMAL HIGH (ref 65–99)
Glucose-Capillary: 218 mg/dL — ABNORMAL HIGH (ref 65–99)

## 2016-01-12 LAB — CULTURE, BLOOD (ROUTINE X 2)

## 2016-01-12 LAB — URINE CULTURE: Culture: NO GROWTH

## 2016-01-12 MED ORDER — VANCOMYCIN HCL IN DEXTROSE 1-5 GM/200ML-% IV SOLN
2000.0000 mg | Freq: Once | INTRAVENOUS | Status: AC
  Administered 2016-01-12: 2000 mg via INTRAVENOUS

## 2016-01-12 MED ORDER — VANCOMYCIN HCL IN DEXTROSE 1-5 GM/200ML-% IV SOLN
1000.0000 mg | Freq: Two times a day (BID) | INTRAVENOUS | Status: DC
  Administered 2016-01-12 – 2016-01-13 (×3): 1000 mg via INTRAVENOUS
  Filled 2016-01-12 (×3): qty 200

## 2016-01-12 MED ORDER — VANCOMYCIN HCL 10 G IV SOLR
2000.0000 mg | Freq: Once | INTRAVENOUS | Status: DC
  Filled 2016-01-12: qty 2000

## 2016-01-12 NOTE — Progress Notes (Signed)
  Subjective:  Patient has no complaints. He has good appetite. He states he has had one bowel movement this morning. It was not formed. He denies melena or rectal bleeding. 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 She tells me that he has been transfused very regularly for several months.  Objective: Blood pressure (!) 108/54, pulse 87, temperature 98.8 F (37.1 C), temperature source Oral, resp. rate 16, height 5\' 5"  (1.651 m), weight 223 lb 3.2 oz (101.2 kg), SpO2 97 %. Patient is alert and in no acute distress. He does not have asterixis. He has mild tremors. Abdomen is distended but soft and nontender with umbilicus/paraumbilicus hernia with thin pigmented skin and superficial ulcer with a scab. This hernia is completely reducible. No LE edema or clubbing noted.  Labs/studies Results:   Recent Labs  01/10/16 1045 01/11/16 0633  WBC 2.7* 3.1*  HGB 8.4* 7.9*  HCT 25.2* 24.0*  PLT 115* 123*    BMET   Recent Labs  01/10/16 1045 01/11/16 0633  NA 126* 129*  K 4.0 3.3*  CL 91* 98*  CO2 25 24  GLUCOSE 108* 63*  BUN 13 10  CREATININE 0.84 0.76  CALCIUM 8.4* 8.4*    LFT   Recent Labs  01/10/16 1045 01/11/16 0633  PROT 6.8 6.3*  ALBUMIN 3.1* 2.8*  AST 31 31  ALT 23 19  ALKPHOS 77 69  BILITOT 1.3* 0.8    PT/INR   Recent Labs  01/10/16 1421  LABPROT 14.7  INR 1.15    Blood cultures positive positive for staphylococcal species. Further information pending.  Ascitic fluid cultures positive for gram-positive cocci further information pending.  Assessment:  #1. Acute diarrheal illness secondary to Plesimonas Shigelloides and enteric pathogen Escherichia coli. Patient has been on IV ceftriaxone and improving. #2. Patient has gram-positive cocci on blood cultures as well as ascitic fluid cultures. He does not have SBP based on low neutrophilic count. His may  be secondary seeding due to bacteremia. Will wait for final ID. He is now on ceftriaxone and vancomycin. #3. Large ventral hernia with superficial ulcer and pain pigmented skin. #4. Chronic anemia. Stool was guaiac negative on this admission. He has history of GI bleed and duodenal polyps. He is scheduled to be seen by GI physician at [facility] next week. Surgical consultation should be considered at [facility] which gastroenterologist at [facility] can arrange.   Recommendations:  Continue current antibiotics until further information available on organism identification and sensitivities.

## 2016-01-12 NOTE — Progress Notes (Addendum)
Pharmacy Antibiotic Note  Jeffrey [name] is a 70 y.o. male admitted on 01/10/2016 with bacteremia.  Pharmacy has been consulted for vancomycin dosing.  Plan: - Vancomycin 2g IV x 1 - Vancomycin 1g IV q12h - Check vanc trough at [facility] - Follow up SCr, UOP, cultures, clinical course and adjust as clinically indicated.   Height: 5\' 5"  (165.1 cm) Weight: 223 lb 3.2 oz (101.2 kg) IBW/kg (Calculated) : 61.5  Temp (24hrs), Avg:98.5 F (36.9 C), Min:98.2 F (36.8 C), Max:98.8 F (37.1 C)   Recent Labs Lab 01/10/16 1045 01/10/16 1144 01/10/16 1421 01/11/16 0633  WBC 2.7*  --   --  3.1*  CREATININE 0.84  --   --  0.76  LATICACIDVEN  --  1.6 1.6  --     Estimated Creatinine Clearance: 96.8 mL/min (by C-G formula based on SCr of 0.8 mg/dL).    No Known Allergies  Antimicrobials this admission: Vancomycin  01/12/16 >>  Rocephin 01/10/16 >>  Dose adjustments this admission:   Microbiology results: 8/12 BCID: Staph spp 8/11 BCx: GPC on GS and    UCx:    Sputum:    MRSA PCR:   Thank you for allowing pharmacy to be a part of this patient's care.  Vonda Antigua 01/12/2016 12:16 AM

## 2016-01-12 NOTE — Progress Notes (Addendum)
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 -Continue rocephin.  Gram positive Bacteremia -also with GPCs in ascitic fluid. -Vancomycin has been added to rocephin. -?SBP. GI believes total WBC is too low for SBP.  Pancytopenia -Due to cirrhosis, avoid heparin products.  Alcoholic cirrhosis  -With ascites, continue propranolol given esophageal varices. -Continue Lasix/spironolactone.  Insulin-dependent diabetes -Continue long-acting insulin, add sliding scale, check hemoglobin A1c  DVT prophylaxis: SCDs Code Status: Full Code Family Communication: Patient only Disposition Plan: To be determined  Consultants:   GI  Procedures:   None  Antimicrobials:   Rocephin    Subjective: Feels better, less diarrhea  Objective: Vitals:   01/10/16 2102 01/11/16 0530 01/11/16 1459 01/11/16 2013  BP: 124/65 131/63 (!) 117/54 (!) 108/54  Pulse: (!) 101 90 86 87  Resp: 20 18 18 16   Temp: 99.1 F (37.3 C) 98.2 F (36.8 C) 98.6 F (37 C) 98.8 F (37.1 C)  TempSrc: Oral Oral Oral Oral  SpO2: 93% 96% 93% 97%  Weight:      Height:        Intake/Output Summary (Last 24 hours) at 01/12/16 1329 Last data filed at 01/12/16 1256  Gross per 24 hour  Intake              650 ml  Output              700 ml  Net              -50 ml   Filed Weights   01/10/16 1039 01/10/16 1808  Weight: 101.6 kg (224 lb) 101.2 kg (223 lb 3.2 oz)     Examination:  General exam: Alert, awake, oriented x 3 Respiratory system: Clear to auscultation. Respiratory effort normal. Cardiovascular system:RRR. No murmurs, rubs, gallops. Gastrointestinal system: Abdomen is distended, +BS. Large ventral hernia with ulceration. Central nervous system: Alert and oriented. No focal neurological deficits. Extremities: No C/C/E, +pedal pulses Skin: No rashes, lesions or ulcers Psychiatry: Judgement and insight appear normal. Mood & affect appropriate.     Data Reviewed: I have personally reviewed following labs and imaging studies  CBC:  Recent Labs Lab 01/10/16 1045 01/11/16 0633  WBC 2.7* 3.1*  HGB 8.4* 7.9*  HCT 25.2* 24.0*  MCV 94.0 93.8  PLT 115* [id number]*   Basic Metabolic Panel:  Recent Labs Lab 01/10/16 1045 01/11/16 0633  NA 126* 129*  K 4.0 3.3*  CL 91* 98*  CO2 25 24  GLUCOSE 108* 63*  BUN 13 10  CREATININE 0.84 0.76  CALCIUM 8.4* 8.4*   GFR: Estimated Creatinine Clearance: 96.8 mL/min (by C-G formula based on SCr of 0.8 mg/dL). Liver Function Tests:  Recent Labs Lab 01/10/16 1045 01/11/16 0633  AST 31 31  ALT 23 19  ALKPHOS 77 69  BILITOT 1.3* 0.8  PROT 6.8 6.3*  ALBUMIN 3.1* 2.8*    Recent Labs Lab 01/10/16 1045  LIPASE 13   No results for input(s): AMMONIA in the  last 168 hours. Coagulation Profile:  Recent Labs Lab 01/10/16 1421  INR 1.15   Cardiac Enzymes:  Recent Labs Lab 01/10/16 1225  TROPONINI <0.03   BNP (last 3 results) No results for input(s): PROBNP in the last 8760 hours. HbA1C: No results for input(s): HGBA1C in the last 72 hours. CBG:  Recent Labs Lab 01/11/16 0842 01/11/16 1114 01/11/16 1645 01/11/16 2011 01/12/16 0743  GLUCAP 81 159* 171* 146* 137*   Lipid Profile: No results for input(s): CHOL, HDL, LDLCALC, TRIG, CHOLHDL, LDLDIRECT in the last 72 hours. Thyroid Function Tests: No results for input(s): TSH, T4TOTAL, FREET4, T3FREE, THYROIDAB in the last  72 hours. Anemia Panel: No results for input(s): VITAMINB12, FOLATE, FERRITIN, TIBC, IRON, RETICCTPCT in the last 72 hours. Urine analysis:    Component Value Date/Time   COLORURINE YELLOW 01/10/2016 1110   APPEARANCEUR CLEAR 01/10/2016 1110   LABSPEC 1.010 01/10/2016 1110   PHURINE 6.0 01/10/2016 1110   GLUCOSEU NEGATIVE 01/10/2016 1110   HGBUR NEGATIVE 01/10/2016 1110   BILIRUBINUR NEGATIVE 01/10/2016 1110   KETONESUR NEGATIVE 01/10/2016 1110   PROTEINUR TRACE (A) 01/10/2016 1110   NITRITE NEGATIVE 01/10/2016 1110   LEUKOCYTESUR NEGATIVE 01/10/2016 1110   Sepsis Labs: @LABRCNTIP (procalcitonin:4,lacticidven:4)  ) Recent Results (from the past 240 hour(s))  Urine culture     Status: None   Collection Time: 01/10/16 11:10 AM  Result Value Ref Range Status   Specimen Description URINE, CLEAN CATCH  Final   Special Requests NONE  Final   Culture NO GROWTH Performed at [facility]   Final   Report Status 01/12/2016 FINAL  Final  Culture, blood (routine x 2)     Status: Abnormal   Collection Time: 01/10/16 11:44 AM  Result Value Ref Range Status   Specimen Description BLOOD  Final   Special Requests NONE  Final   Culture  Setup Time   Final    GRAM POSITIVE COCCI Gram Stain Report Called to,Read Back By and Verified With: Howard, M AT 0835 ON 01/11/2016 BY WOODS,M AEROBIC BOTTLE ONLY    Culture (A)  Final    STAPHYLOCOCCUS SPECIES (COAGULASE NEGATIVE) THE SIGNIFICANCE OF ISOLATING THIS ORGANISM FROM A SINGLE SET OF BLOOD CULTURES WHEN MULTIPLE SETS ARE DRAWN IS UNCERTAIN. PLEASE NOTIFY THE MICROBIOLOGY DEPARTMENT WITHIN ONE WEEK IF SPECIATION AND SENSITIVITIES ARE REQUIRED. Performed at [facility]    Report Status 01/12/2016 FINAL  Final  Blood Culture ID Panel (Reflexed)     Status: Abnormal   Collection Time: 01/10/16 11:44 AM  Result Value Ref Range Status   Enterococcus species NOT DETECTED NOT DETECTED Final   Vancomycin resistance NOT DETECTED  NOT DETECTED Final   Listeria monocytogenes NOT DETECTED NOT DETECTED Final   Staphylococcus species DETECTED (A) NOT DETECTED Final    Comment: CRITICAL RESULT CALLED TO, READ BACK BY AND VERIFIED WITH: TO STANDRA(RN) BY TCLEVELAND 01/11/2016 AT 10:21PM    Staphylococcus aureus NOT DETECTED NOT DETECTED Final   Methicillin resistance NOT DETECTED NOT DETECTED Final   Streptococcus species NOT DETECTED NOT DETECTED Final   Streptococcus agalactiae NOT DETECTED NOT DETECTED Final   Streptococcus pneumoniae NOT DETECTED NOT DETECTED Final   Streptococcus pyogenes NOT DETECTED NOT DETECTED Final   Acinetobacter baumannii NOT DETECTED NOT DETECTED Final   Enterobacteriaceae species NOT DETECTED NOT DETECTED Final   Enterobacter cloacae complex NOT DETECTED NOT DETECTED Final   Escherichia coli NOT DETECTED NOT DETECTED Final   Klebsiella oxytoca NOT DETECTED NOT DETECTED Final  Klebsiella pneumoniae NOT DETECTED NOT DETECTED Final   Proteus species NOT DETECTED NOT DETECTED Final   Serratia marcescens NOT DETECTED NOT DETECTED Final   Carbapenem resistance NOT DETECTED NOT DETECTED Final   Haemophilus influenzae NOT DETECTED NOT DETECTED Final   Neisseria meningitidis NOT DETECTED NOT DETECTED Final   Pseudomonas aeruginosa NOT DETECTED NOT DETECTED Final   Candida albicans NOT DETECTED NOT DETECTED Final   Candida glabrata NOT DETECTED NOT DETECTED Final   Candida krusei NOT DETECTED NOT DETECTED Final   Candida parapsilosis NOT DETECTED NOT DETECTED Final   Candida tropicalis NOT DETECTED NOT DETECTED Final    Comment: Performed at [facility]  Culture, blood (routine x 2)     Status: None (Preliminary result)   Collection Time: 01/10/16 12:24 PM  Result Value Ref Range Status   Specimen Description BLOOD  Final   Special Requests NONE  Final   Culture NO GROWTH < 24 HOURS  Final   Report Status PENDING  Incomplete  Gram stain     Status: None   Collection Time: 01/10/16   3:10 PM  Result Value Ref Range Status   Specimen Description PERITONEAL  Final   Special Requests NONE  Final   Gram Stain   Final    CYTOSPIN SMEAR NO ORGANISMS SEEN WBC PRESENT,BOTH PMN AND MONONUCLEAR   Report Status 01/10/2016 FINAL  Final  Culture, body fluid-bottle     Status: None (Preliminary result)   Collection Time: 01/10/16  3:10 PM  Result Value Ref Range Status   Specimen Description PERITONEAL  Final   Special Requests BOTTLES DRAWN AEROBIC AND ANAEROBIC 6CC EACH  Final   Gram Stain   Final    GRAM POSITIVE COCCI Gram Stain Report Called to,Read Back By and Verified With: HANDY,S. AT 2352 ON 01/11/2016 BY AGUNDIZ,E. SEEN IN AEROBIC BOTTLE   Culture   Final    GRAM POSITIVE COCCI CULTURE REINCUBATED FOR BETTER GROWTH Performed at [facility]    Report Status PENDING  Incomplete  Gastrointestinal Panel by PCR , Stool     Status: Abnormal   Collection Time: 01/10/16  4:30 PM  Result Value Ref Range Status   Campylobacter species NOT DETECTED NOT DETECTED Final   Plesimonas shigelloides DETECTED (A) NOT DETECTED Final    Comment: CRITICAL RESULT CALLED TO, READ BACK BY AND VERIFIED WITH: Chilton Greathouse RN @ [id number] 01/11/16 by [place] species NOT DETECTED NOT DETECTED Final   Yersinia enterocolitica NOT DETECTED NOT DETECTED Final   Vibrio species NOT DETECTED NOT DETECTED Final   Vibrio cholerae NOT DETECTED NOT DETECTED Final   Enteroaggregative E coli (EAEC) NOT DETECTED NOT DETECTED Final   Enteropathogenic E coli (EPEC) DETECTED (A) NOT DETECTED Final    Comment: CRITICAL RESULT CALLED TO, READ BACK BY AND VERIFIED WITH: Chilton Greathouse @ [id number] 01/11/16 by [facility]    Enterotoxigenic E coli (ETEC) NOT DETECTED NOT DETECTED Final   Shiga like toxin producing E coli (STEC) NOT DETECTED NOT DETECTED Final   E. coli O157 NOT DETECTED NOT DETECTED Final   Shigella/Enteroinvasive E coli (EIEC) NOT DETECTED NOT DETECTED Final   Cryptosporidium NOT DETECTED NOT  DETECTED Final   Cyclospora cayetanensis NOT DETECTED NOT DETECTED Final   Entamoeba histolytica NOT DETECTED NOT DETECTED Final   Giardia lamblia NOT DETECTED NOT DETECTED Final   Adenovirus F40/41 NOT DETECTED NOT DETECTED Final   Astrovirus NOT DETECTED NOT DETECTED Final   Norovirus GI/GII  NOT DETECTED NOT DETECTED Final   Rotavirus A NOT DETECTED NOT DETECTED Final   Sapovirus (I, II, IV, and V) NOT DETECTED NOT DETECTED Final  C difficile quick scan w PCR reflex     Status: None   Collection Time: 01/10/16  4:30 PM  Result Value Ref Range Status   C Diff antigen NEGATIVE NEGATIVE Final   C Diff toxin NEGATIVE NEGATIVE Final   C Diff interpretation No C. difficile detected.  Final         Radiology Studies: US Paracentesis  Result Date: 01/10/2016 INDICATION: Ascites and fever. Mild abdominal pain. Diagnostic paracentesis for spontaneous bacterial peritonitis EXAM: ULTRASOUND GUIDED  PARACENTESIS MEDICATIONS: None. COMPLICATIONS: None immediate. PROCEDURE: Informed written consent was obtained from the patient after a discussion of the risks, benefits and alternatives to treatment. A timeout was performed prior to the initiation of the procedure. Initial ultrasound scanning demonstrates a small amount of ascites distributed throughout the abdomen. After discussion with ordering provider, need for diagnostic sampling was apparent. The status pocket is within an umbilical hernia which head no precluding internal vessels or bowel. There was a small scab on the umbilical hernia skin, but no surrounding cellulitic changes and this area was generously avoided. The hernia was prepped and draped in the usual sterile fashion. Using sonographic guidance hernia was punctured with a 22 gauge needle and 30 cc of straw colored fluid was aspirated. An ultrasound image was saved for documentation purposes. A dressing was applied. The patient tolerated the procedure well without immediate post procedural  complication. FINDINGS: A total of approximately 30 cc of straw-colored fluid was removed. Samples were sent to the laboratory as requested by the clinical team. IMPRESSION: Successful ultrasound-guided diagnostic paracentesis. Electronically Signed   By: Monte Fantasia M.D.   On: 01/10/2016 15:39        Scheduled Meds: . cefTRIAXone (ROCEPHIN)  IV  2 g Intravenous Q24H  . cyclobenzaprine  10 mg Oral Daily  . feeding supplement (ENSURE ENLIVE)  237 mL Oral BID BM  . furosemide  40 mg Oral Daily  . gabapentin  100 mg Oral TID  . glipiZIDE  10 mg Oral BID AC  . insulin aspart  0-5 Units Subcutaneous QHS  . insulin aspart  0-9 Units Subcutaneous TID WC  . insulin aspart  3 Units Subcutaneous TID WC  . insulin detemir  80 Units Subcutaneous QHS  . iron polysaccharides  150 mg Oral Daily  . lisinopril  10 mg Oral Daily  . multivitamin with minerals  1 tablet Oral Daily  . pantoprazole  40 mg Oral Daily  . pravastatin  20 mg Oral q1800  . propranolol  40 mg Oral BID  . sodium chloride flush  3 mL Intravenous Q12H  . spironolactone  25 mg Oral Daily  . tamsulosin  0.4 mg Oral QPC supper  . vitamin B-1  250 mg Oral Daily  . vancomycin  1,000 mg Intravenous Q12H  . venlafaxine  37.5 mg Oral BID   Continuous Infusions:    LOS: 2 days    Time spent: 25 minutes. Greater than 50% of this time was spent in direct contact with the patient coordinating care.     Lelon Frohlich, MD [facility] Pager [phone]  If 7PM-7AM, please contact night-coverage [email or web address] Password [email or web address] 01/12/2016, 1:29 PM

## 2016-01-13 DIAGNOSIS — R197 Diarrhea, unspecified: Secondary | ICD-10-CM

## 2016-01-13 DIAGNOSIS — K7031 Alcoholic cirrhosis of liver with ascites: Secondary | ICD-10-CM

## 2016-01-13 LAB — GLUCOSE, CAPILLARY
Glucose-Capillary: 57 mg/dL — ABNORMAL LOW (ref 65–99)
Glucose-Capillary: 99 mg/dL (ref 65–99)
Glucose-Capillary: 251 mg/dL — ABNORMAL HIGH (ref 65–99)
Glucose-Capillary: 179 mg/dL — ABNORMAL HIGH (ref 65–99)
Glucose-Capillary: 166 mg/dL — ABNORMAL HIGH (ref 65–99)

## 2016-01-13 LAB — OCCULT BLOOD, POC DEVICE: Fecal Occult Bld: NEGATIVE

## 2016-01-13 LAB — PATHOLOGIST SMEAR REVIEW

## 2016-01-13 LAB — HEMOGLOBIN A1C
Hgb A1c MFr Bld: 5.3 % (ref 4.8–5.6)
Mean Plasma Glucose: 105 mg/dL

## 2016-01-13 MED ORDER — CALCIUM CARBONATE ANTACID 500 MG PO CHEW
3.0000 | CHEWABLE_TABLET | Freq: Once | ORAL | Status: AC
  Administered 2016-01-13: 600 mg via ORAL
  Filled 2016-01-13: qty 3

## 2016-01-13 MED ORDER — INSULIN DETEMIR 100 UNIT/ML ~~LOC~~ SOLN
70.0000 [IU] | Freq: Every day | SUBCUTANEOUS | Status: DC
  Administered 2016-01-13: 70 [IU] via SUBCUTANEOUS
  Filled 2016-01-13 (×2): qty 0.7

## 2016-01-13 NOTE — Progress Notes (Signed)
Pt c/o indigestion.  Tylene Fantasia notified.  Orders received.

## 2016-01-13 NOTE — Progress Notes (Signed)
Hypoglycemic Event  CBG: 57 at 0744  Treatment: 15 GM carbohydrate snack  Symptoms: None  Follow-up CBG: Time:99 CBG Result:0826  Possible Reasons for Event: Unknown  Comments/MD notified:Hernandez via [place], Gypsy Balsam

## 2016-01-13 NOTE — Care Management Important Message (Signed)
Important Message  Patient Details  Name: Jeffrey [name] MRN: [id number] Date of Birth: 04/23/1946   Medicare Important Message Given:  Yes    Sherald Barge, RN 01/13/2016, 3:31 PM

## 2016-01-13 NOTE — Progress Notes (Signed)
Per Infection Control d/c enteric precautions.  Continue use of standard precautions.

## 2016-01-13 NOTE — Progress Notes (Signed)
PROGRESS NOTE    Jeffrey [name]  [id number] DOB: 05-11-1946 DOA: 01/10/2016 PCP: Monico Blitz, MD     Brief Narrative:  70 y/o man with alcoholic cirrhosis here with abdominal pain and diarrhea   Assessment & Plan:   Active Problems:   Alcoholic cirrhosis of liver with ascites (HCC)   Pancytopenia (HCC)   Esophageal varices in alcoholic cirrhosis (HCC)   Diarrhea   Shigella dysenteriae   SBP (spontaneous bacterial peritonitis) ([place])   Abdominal Pain/Diarrhea -Initially thought to have SBP, but stool panel has resulted with plesimonas shigelloides and enteropathogenic E Coli. -Continue rocephin. -Much improved, diarrhea resolved.  Gram positive Bacteremia -Blood and ascitic fluid with CNS. -Will DC vanc.  Pancytopenia -Due to cirrhosis, avoid heparin products.   Alcoholic cirrhosis  -With ascites, continue propranolol given esophageal varices. -Continue Lasix/spironolactone. -For paracentesis in am.  Insulin-dependent diabetes -Was hypoglycemic this am. -Will decrease levemir from 80 to 70 units qHs.  DVT prophylaxis: SCDs Code Status: Full Code Family Communication: Patient only Disposition Plan: To be determined  Consultants:   GI  Procedures:   None  Antimicrobials:   Rocephin    Subjective: Feels better, less diarrhea  Objective: Vitals:   01/12/16 2004 01/12/16 2230 01/13/16 0650 01/13/16 1340  BP:  (!) 113/51 128/68 (!) 108/52  Pulse:  88 73 75  Resp:  20 20 20   Temp:  99.7 F (37.6 C) 98.4 F (36.9 C) 98.4 F (36.9 C)  TempSrc:  Oral Oral Oral  SpO2: 98% 96% 99% 100%  Weight:      Height:        Intake/Output Summary (Last 24 hours) at 01/13/16 1641 Last data filed at 01/13/16 1500  Gross per 24 hour  Intake              930 ml  Output              950 ml  Net              -20 ml   Filed Weights   01/10/16 1039 01/10/16 1808  Weight: 101.6 kg (224 lb) 101.2 kg (223 lb 3.2 oz)    Examination:  General exam: Alert,  awake, oriented x 3 Respiratory system: Clear to auscultation. Respiratory effort normal. Cardiovascular system:RRR. No murmurs, rubs, gallops. Gastrointestinal system: Abdomen is distended, +BS. Large ventral hernia with ulceration. Central nervous system: Alert and oriented. No focal neurological deficits. Extremities: No C/C/E, +pedal pulses Skin: No rashes, lesions or ulcers Psychiatry: Judgement and insight appear normal. Mood & affect appropriate.     Data Reviewed: I have personally reviewed following labs and imaging studies  CBC:  Recent Labs Lab 01/10/16 1045 01/11/16 0633  WBC 2.7* 3.1*  HGB 8.4* 7.9*  HCT 25.2* 24.0*  MCV 94.0 93.8  PLT 115* [id number]*   Basic Metabolic Panel:  Recent Labs Lab 01/10/16 1045 01/11/16 0633  NA 126* 129*  K 4.0 3.3*  CL 91* 98*  CO2 25 24  GLUCOSE 108* 63*  BUN 13 10  CREATININE 0.84 0.76  CALCIUM 8.4* 8.4*   GFR: Estimated Creatinine Clearance: 96.8 mL/min (by C-G formula based on SCr of 0.8 mg/dL). Liver Function Tests:  Recent Labs Lab 01/10/16 1045 01/11/16 0633  AST 31 31  ALT 23 19  ALKPHOS 77 69  BILITOT 1.3* 0.8  PROT 6.8 6.3*  ALBUMIN 3.1* 2.8*    Recent Labs Lab 01/10/16 1045  LIPASE 13   No results for input(s): AMMONIA in the  last 168 hours. Coagulation Profile:  Recent Labs Lab 01/10/16 1421  INR 1.15   Cardiac Enzymes:  Recent Labs Lab 01/10/16 1225  TROPONINI <0.03   BNP (last 3 results) No results for input(s): PROBNP in the last 8760 hours. HbA1C: No results for input(s): HGBA1C in the last 72 hours. CBG:  Recent Labs Lab 01/12/16 1621 01/12/16 2034 01/13/16 0744 01/13/16 0826 01/13/16 1127  GLUCAP 141* 218* 57* 99 251*   Lipid Profile: No results for input(s): CHOL, HDL, LDLCALC, TRIG, CHOLHDL, LDLDIRECT in the last 72 hours. Thyroid Function Tests: No results for input(s): TSH, T4TOTAL, FREET4, T3FREE, THYROIDAB in the last 72 hours. Anemia Panel: No results for  input(s): VITAMINB12, FOLATE, FERRITIN, TIBC, IRON, RETICCTPCT in the last 72 hours. Urine analysis:    Component Value Date/Time   COLORURINE YELLOW 01/10/2016 1110   APPEARANCEUR CLEAR 01/10/2016 1110   LABSPEC 1.010 01/10/2016 1110   PHURINE 6.0 01/10/2016 1110   GLUCOSEU NEGATIVE 01/10/2016 1110   HGBUR NEGATIVE 01/10/2016 1110   BILIRUBINUR NEGATIVE 01/10/2016 1110   KETONESUR NEGATIVE 01/10/2016 1110   PROTEINUR TRACE (A) 01/10/2016 1110   NITRITE NEGATIVE 01/10/2016 1110   LEUKOCYTESUR NEGATIVE 01/10/2016 1110   Sepsis Labs: @LABRCNTIP (procalcitonin:4,lacticidven:4)  ) Recent Results (from the past 240 hour(s))  Urine culture     Status: None   Collection Time: 01/10/16 11:10 AM  Result Value Ref Range Status   Specimen Description URINE, CLEAN CATCH  Final   Special Requests NONE  Final   Culture NO GROWTH Performed at [facility]   Final   Report Status 01/12/2016 FINAL  Final  Culture, blood (routine x 2)     Status: Abnormal   Collection Time: 01/10/16 11:44 AM  Result Value Ref Range Status   Specimen Description BLOOD  Final   Special Requests NONE  Final   Culture  Setup Time   Final    GRAM POSITIVE COCCI Gram Stain Report Called to,Read Back By and Verified With: Barber, M AT 0835 ON 01/11/2016 BY WOODS,M AEROBIC BOTTLE ONLY    Culture (A)  Final    STAPHYLOCOCCUS SPECIES (COAGULASE NEGATIVE) THE SIGNIFICANCE OF ISOLATING THIS ORGANISM FROM A SINGLE SET OF BLOOD CULTURES WHEN MULTIPLE SETS ARE DRAWN IS UNCERTAIN. PLEASE NOTIFY THE MICROBIOLOGY DEPARTMENT WITHIN ONE WEEK IF SPECIATION AND SENSITIVITIES ARE REQUIRED. Performed at [facility]    Report Status 01/12/2016 FINAL  Final  Blood Culture ID Panel (Reflexed)     Status: Abnormal   Collection Time: 01/10/16 11:44 AM  Result Value Ref Range Status   Enterococcus species NOT DETECTED NOT DETECTED Final   Vancomycin resistance NOT DETECTED NOT DETECTED Final   Listeria  monocytogenes NOT DETECTED NOT DETECTED Final   Staphylococcus species DETECTED (A) NOT DETECTED Final    Comment: CRITICAL RESULT CALLED TO, READ BACK BY AND VERIFIED WITH: TO STANDRA(RN) BY TCLEVELAND 01/11/2016 AT 10:21PM    Staphylococcus aureus NOT DETECTED NOT DETECTED Final   Methicillin resistance NOT DETECTED NOT DETECTED Final   Streptococcus species NOT DETECTED NOT DETECTED Final   Streptococcus agalactiae NOT DETECTED NOT DETECTED Final   Streptococcus pneumoniae NOT DETECTED NOT DETECTED Final   Streptococcus pyogenes NOT DETECTED NOT DETECTED Final   Acinetobacter baumannii NOT DETECTED NOT DETECTED Final   Enterobacteriaceae species NOT DETECTED NOT DETECTED Final   Enterobacter cloacae complex NOT DETECTED NOT DETECTED Final   Escherichia coli NOT DETECTED NOT DETECTED Final   Klebsiella oxytoca NOT DETECTED NOT DETECTED Final  Klebsiella pneumoniae NOT DETECTED NOT DETECTED Final   Proteus species NOT DETECTED NOT DETECTED Final   Serratia marcescens NOT DETECTED NOT DETECTED Final   Carbapenem resistance NOT DETECTED NOT DETECTED Final   Haemophilus influenzae NOT DETECTED NOT DETECTED Final   Neisseria meningitidis NOT DETECTED NOT DETECTED Final   Pseudomonas aeruginosa NOT DETECTED NOT DETECTED Final   Candida albicans NOT DETECTED NOT DETECTED Final   Candida glabrata NOT DETECTED NOT DETECTED Final   Candida krusei NOT DETECTED NOT DETECTED Final   Candida parapsilosis NOT DETECTED NOT DETECTED Final   Candida tropicalis NOT DETECTED NOT DETECTED Final    Comment: Performed at [facility]  Culture, blood (routine x 2)     Status: None (Preliminary result)   Collection Time: 01/10/16 12:24 PM  Result Value Ref Range Status   Specimen Description BLOOD  Final   Special Requests NONE  Final   Culture NO GROWTH 3 DAYS  Final   Report Status PENDING  Incomplete  Gram stain     Status: None   Collection Time: 01/10/16  3:10 PM  Result Value Ref Range  Status   Specimen Description PERITONEAL  Final   Special Requests NONE  Final   Gram Stain   Final    CYTOSPIN SMEAR NO ORGANISMS SEEN WBC PRESENT,BOTH PMN AND MONONUCLEAR   Report Status 01/10/2016 FINAL  Final  Culture, body fluid-bottle     Status: Abnormal (Preliminary result)   Collection Time: 01/10/16  3:10 PM  Result Value Ref Range Status   Specimen Description PERITONEAL  Final   Special Requests BOTTLES DRAWN AEROBIC AND ANAEROBIC 6CC EACH  Final   Gram Stain   Final    GRAM POSITIVE COCCI Gram Stain Report Called to,Read Back By and Verified With: HANDY,S. AT 2352 ON 01/11/2016 BY AGUNDIZ,E. SEEN IN AEROBIC BOTTLE   Culture (A)  Final    STAPHYLOCOCCUS SPECIES (COAGULASE NEGATIVE) SUSCEPTIBILITIES TO FOLLOW Performed at [facility]    Report Status PENDING  Incomplete  Gastrointestinal Panel by PCR , Stool     Status: Abnormal   Collection Time: 01/10/16  4:30 PM  Result Value Ref Range Status   Campylobacter species NOT DETECTED NOT DETECTED Final   Plesimonas shigelloides DETECTED (A) NOT DETECTED Final    Comment: CRITICAL RESULT CALLED TO, READ BACK BY AND VERIFIED WITH: Chilton Greathouse RN @ [id number] 01/11/16 by [place] species NOT DETECTED NOT DETECTED Final   Yersinia enterocolitica NOT DETECTED NOT DETECTED Final   Vibrio species NOT DETECTED NOT DETECTED Final   Vibrio cholerae NOT DETECTED NOT DETECTED Final   Enteroaggregative E coli (EAEC) NOT DETECTED NOT DETECTED Final   Enteropathogenic E coli (EPEC) DETECTED (A) NOT DETECTED Final    Comment: CRITICAL RESULT CALLED TO, READ BACK BY AND VERIFIED WITH: Chilton Greathouse @ [id number] 01/11/16 by [facility]    Enterotoxigenic E coli (ETEC) NOT DETECTED NOT DETECTED Final   Shiga like toxin producing E coli (STEC) NOT DETECTED NOT DETECTED Final   E. coli O157 NOT DETECTED NOT DETECTED Final   Shigella/Enteroinvasive E coli (EIEC) NOT DETECTED NOT DETECTED Final   Cryptosporidium NOT DETECTED NOT DETECTED  Final   Cyclospora cayetanensis NOT DETECTED NOT DETECTED Final   Entamoeba histolytica NOT DETECTED NOT DETECTED Final   Giardia lamblia NOT DETECTED NOT DETECTED Final   Adenovirus F40/41 NOT DETECTED NOT DETECTED Final   Astrovirus NOT DETECTED NOT DETECTED Final   Norovirus GI/GII NOT DETECTED  NOT DETECTED Final   Rotavirus A NOT DETECTED NOT DETECTED Final   Sapovirus (I, II, IV, and V) NOT DETECTED NOT DETECTED Final  C difficile quick scan w PCR reflex     Status: None   Collection Time: 01/10/16  4:30 PM  Result Value Ref Range Status   C Diff antigen NEGATIVE NEGATIVE Final   C Diff toxin NEGATIVE NEGATIVE Final   C Diff interpretation No C. difficile detected.  Final         Radiology Studies: No results found.      Scheduled Meds: . cefTRIAXone (ROCEPHIN)  IV  2 g Intravenous Q24H  . cyclobenzaprine  10 mg Oral Daily  . feeding supplement (ENSURE ENLIVE)  237 mL Oral BID BM  . furosemide  40 mg Oral Daily  . gabapentin  100 mg Oral TID  . glipiZIDE  10 mg Oral BID AC  . insulin aspart  0-5 Units Subcutaneous QHS  . insulin aspart  0-9 Units Subcutaneous TID WC  . insulin aspart  3 Units Subcutaneous TID WC  . insulin detemir  80 Units Subcutaneous QHS  . iron polysaccharides  150 mg Oral Daily  . lisinopril  10 mg Oral Daily  . multivitamin with minerals  1 tablet Oral Daily  . pantoprazole  40 mg Oral Daily  . pravastatin  20 mg Oral q1800  . propranolol  40 mg Oral BID  . sodium chloride flush  3 mL Intravenous Q12H  . spironolactone  25 mg Oral Daily  . tamsulosin  0.4 mg Oral QPC supper  . vitamin B-1  250 mg Oral Daily  . venlafaxine  37.5 mg Oral BID   Continuous Infusions:    LOS: 3 days    Time spent: 25 minutes. Greater than 50% of this time was spent in direct contact with the patient coordinating care.     Lelon Frohlich, MD [facility] Pager [phone]  If 7PM-7AM, please contact  night-coverage [email or web address] Password [facility] 01/13/2016, 4:41 PM

## 2016-01-13 NOTE — Progress Notes (Signed)
Inpatient Diabetes Program Recommendations  AACE/ADA: New Consensus Statement on Inpatient Glycemic Control (2015)  Target Ranges:  Prepandial:   less than 140 mg/dL      Peak postprandial:   less than 180 mg/dL (1-2 hours)      Critically ill patients:  140 - 180 mg/dL   Lab Results  Component Value Date   GLUCAP 251 (H) 01/13/2016   HGBA1C 5.3 01/10/2016    Review of Glycemic ControlResults for THAO, DAFFERN (MRN [id number]) as of 01/13/2016 15:36  Ref. Range 01/12/2016 07:43 01/12/2016 11:49 01/12/2016 16:21 01/12/2016 20:34 01/13/2016 07:44 01/13/2016 08:26 01/13/2016 11:27  Glucose-Capillary Latest Ref Range: 65 - 99 mg/dL 137 (H) 222 (H) 141 (H) 218 (H) 57 (L) 99 251 (H)   Diabetes history: Type 2 diabetes Outpatient Diabetes medications: Levemir 80 units q HS, Metformin 1000 mg bid, Januvia 100 mg daily, Glucotrol 10 mg bid Current orders for Inpatient glycemic control:  Novolog sensitive tid with meals and HS, Novolog 3 units tid with meals, Glucotrol 10 mg bid, Levemir 80 units q HS  Inpatient Diabetes Program Recommendations:    Note fasting blood sugar low this morning. Please consider reducing Levemir to 64 units q HS (Decrease by 20%) while in the hospital.  Thanks, Adah Perl, RN, BC-ADM Inpatient Diabetes Coordinator Pager [phone] (8a-5p)

## 2016-01-13 NOTE — Progress Notes (Signed)
    Subjective: No abdominal pain, N/V. No overt GI bleeding. No diarrhea. States he actually had to "strain" some this morning.   Objective: Vital signs in last 24 hours: Temp:  [98.4 F (36.9 C)-99.7 F (37.6 C)] 98.4 F (36.9 C) (08/14 0650) Pulse Rate:  [73-88] 73 (08/14 0650) Resp:  [20] 20 (08/14 0650) BP: (113-128)/(51-68) 128/68 (08/14 0650) SpO2:  [96 %-100 %] 99 % (08/14 0650) Last BM Date: 01/12/16 General:   Alert and oriented, pleasant Head:  Normocephalic and atraumatic. Abdomen:  Bowel sounds present, distended but soft, reducible large umbilical hernia with thin skin, superficial scabbed ulcer on posterior portion of hernia Extremities:  Without r edema. Neurologic:  Alert and  oriented x4; negative asterixis Psych:  Alert and cooperative.   Intake/Output from previous day: 08/13 0701 - 08/14 0700 In: 810 [P.O.:360; IV Piggyback:450] Out: 500 [Urine:500] Intake/Output this shift: No intake/output data recorded.  Lab Results:  Recent Labs  01/10/16 1045 01/11/16 0633  WBC 2.7* 3.1*  HGB 8.4* 7.9*  HCT 25.2* 24.0*  PLT 115* 123*   BMET  Recent Labs  01/10/16 1045 01/11/16 0633  NA 126* 129*  K 4.0 3.3*  CL 91* 98*  CO2 25 24  GLUCOSE 108* 63*  BUN 13 10  CREATININE 0.84 0.76  CALCIUM 8.4* 8.4*   LFT  Recent Labs  01/10/16 1045 01/11/16 0633  PROT 6.8 6.3*  ALBUMIN 3.1* 2.8*  AST 31 31  ALT 23 19  ALKPHOS 77 69  BILITOT 1.3* 0.8   PT/INR  Recent Labs  01/10/16 1421  LABPROT 14.7  INR 1.15    Assessment: 70 year old male with history of ETOH cirrhosis admitted with acute diarrhea secondary to Plesimonas shigelloides and E.coli, found to have gram-positive cocci through blood and ascitic fluid cultures, with final body fluid culture still pending. No evidence of SBP due to low neutrophil count. Currently on ceftriaxone and vancomycin.   Chronic anemia: without overt GI bleeding. Heme negative. To be seen at [facility] this week,  which may need to be postponed. Large ventral hernia can be addressed at [facility] as well.      Plan: Will continue to current antibiotic dosing until database expands; appreciate pharmacy input Will likely need to postpone [facility] evaluation; our office can help facilitate this Outpatient cirrhosis care   Jeffrey [name], ANP-BC [facility]     LOS: 3 days    01/13/2016, 8:16 AM

## 2016-01-14 ENCOUNTER — Telehealth: Payer: Self-pay | Admitting: Gastroenterology

## 2016-01-14 ENCOUNTER — Other Ambulatory Visit (HOSPITAL_COMMUNITY): Payer: Medicare Other

## 2016-01-14 ENCOUNTER — Inpatient Hospital Stay (HOSPITAL_COMMUNITY): Payer: Medicare Other

## 2016-01-14 ENCOUNTER — Other Ambulatory Visit (HOSPITAL_COMMUNITY): Payer: Self-pay

## 2016-01-14 LAB — GLUCOSE, CAPILLARY
Glucose-Capillary: 61 mg/dL — ABNORMAL LOW (ref 65–99)
Glucose-Capillary: 79 mg/dL (ref 65–99)
Glucose-Capillary: 241 mg/dL — ABNORMAL HIGH (ref 65–99)
Glucose-Capillary: 162 mg/dL — ABNORMAL HIGH (ref 65–99)
Glucose-Capillary: 216 mg/dL — ABNORMAL HIGH (ref 65–99)

## 2016-01-14 LAB — CULTURE, BODY FLUID-BOTTLE

## 2016-01-14 LAB — CULTURE, BODY FLUID W GRAM STAIN -BOTTLE

## 2016-01-14 MED ORDER — VANCOMYCIN HCL IN DEXTROSE 1-5 GM/200ML-% IV SOLN
1000.0000 mg | Freq: Two times a day (BID) | INTRAVENOUS | Status: DC
  Administered 2016-01-14 – 2016-01-15 (×3): 1000 mg via INTRAVENOUS
  Filled 2016-01-14 (×3): qty 200

## 2016-01-14 MED ORDER — INSULIN DETEMIR 100 UNIT/ML ~~LOC~~ SOLN
65.0000 [IU] | Freq: Every day | SUBCUTANEOUS | Status: DC
  Administered 2016-01-14: 65 [IU] via SUBCUTANEOUS
  Filled 2016-01-14 (×2): qty 0.65

## 2016-01-14 NOTE — Progress Notes (Signed)
    Subjective: Abdomen feels tighter. Soft stool this morning, no diarrhea. No abdominal pain.   Objective: Vital signs in last 24 hours: Temp:  [97.8 F (36.6 C)-98.6 F (37 C)] 97.8 F (36.6 C) (08/15 0703) Pulse Rate:  [72-79] 72 (08/15 0703) Resp:  [20] 20 (08/15 0703) BP: (107-113)/(52-58) 113/58 (08/15 0703) SpO2:  [96 %-100 %] 99 % (08/15 0703) Last BM Date: 01/13/16 General:   Alert and oriented, pleasant Head:  Normocephalic and atraumatic. Eyes:  No icterus, sclera clear. Conjuctiva pink.  Abdomen:  Bowel sounds present, round, distended but still soft, large umbilical hernia that is reducible with thin skin, superficial scabbed ulcer posterior portion of hernia  Extremities:  Trace pitting edema lower extremities  Neurologic:  Alert and  oriented x4 Psych:  Alert and cooperative. Normal mood and affect.  Intake/Output from previous day: 08/14 0701 - 08/15 0700 In: 920 [P.O.:720; IV Piggyback:200] Out: 1301 [Urine:1300; Stool:1] Intake/Output this shift: No intake/output data recorded.  Assessment: 70 year old male with history of ETOH cirrhosis admitted with acute diarrhea secondary to Plesimonas shigelloides and E.coli but clinically improving. Treatment for Plesimonas shigelloides usually supportive but antibiotics could be considered in severe cases. Patient has improved with supportive measures.   Found to have gram-positive cocci coagulase negative through blood and ascitic fluid cultures but without evidence of SBP due to low neutrophil count. Felt to possibly be a contaminant, and repeat diagnostic paracentesis has been ordered for today. Received call from Dr. Pascal Lux, radiologist, that therapeutic paracentesis is not possible as enough fluid is not present. Currently on Rocephin and vancomycin. Will await repeat ascitic fluid analysis. Doubt dealing with SBP and likely prior analysis was contaminant. Pharmacy on board.   Chronic anemia: without overt GI  bleeding. Heme negative. To be seen at [facility] Wednesday, which will need to be postponed. Large ventral hernia can be addressed at [facility] later as well.   Plan: Follow-up on repeat ascitic fluid analysis Patient will need to follow-up as an outpatient for cirrhosis care, as he has previously been seen in [place] and last in our office Nov 2016 (care has been fragmented) [facility] appointment: our office will call to inform he is inpatient   Orvil Feil, ANP-BC [facility]    LOS: 4 days    01/14/2016, 8:21 AM

## 2016-01-14 NOTE — Progress Notes (Signed)
Pharmacy Antibiotic Note  Jeffrey [name] is a 70 y.o. male admitted on 01/10/2016 with SBP?[name]  Pharmacy has been consulted for Vancomycin dosing.  Plan: Vancomycin 1gm IV every 12 hours.  Goal trough 15-20 mcg/mL.  Monitor labs, micro and vitals.  Also on Rocephin 2gm IV every 24 hours.  Height: 5\' 5"  (165.1 cm) Weight: 223 lb 3.2 oz (101.2 kg) IBW/kg (Calculated) : 61.5  Temp (24hrs), Avg:98.3 F (36.8 C), Min:97.8 F (36.6 C), Max:98.6 F (37 C)   Recent Labs Lab 01/10/16 1045 01/10/16 1144 01/10/16 1421 01/11/16 0633  WBC 2.7*  --   --  3.1*  CREATININE 0.84  --   --  0.76  LATICACIDVEN  --  1.6 1.6  --     Estimated Creatinine Clearance: 96.8 mL/min (by C-G formula based on SCr of 0.8 mg/dL).    No Known Allergies  Antimicrobials this admission: Rocephin 8/11 >>   Vancomycin 8/13 >> 8/14, 8/15 >>  Dose adjustments this admission: n/a  Microbiology results: 8/11 BCx: Staph 8/11 UCx: NGTD  8/11 Peritoneal Fluid: CNS - Oxacillin resistant   Thank you for allowing pharmacy to be a part of this patient's care.  Pricilla Larsson 01/14/2016 3:40 PM

## 2016-01-14 NOTE — Progress Notes (Signed)
PROGRESS NOTE    Jeffrey [name]  [id number] DOB: 1946/02/04 DOA: 01/10/2016 PCP: Monico Blitz, MD     Brief Narrative:  70 y/o man with alcoholic cirrhosis here with abdominal pain and diarrhea, Blood and peritoneal fluid growing oxacillin resistant coag negative staph.   Assessment & Plan:   Active Problems:   Alcoholic cirrhosis of liver with ascites (HCC)   Pancytopenia (HCC)   Esophageal varices in alcoholic cirrhosis (HCC)   Diarrhea   Shigella dysenteriae   SBP (spontaneous bacterial peritonitis) ([place])   Abdominal Pain/Diarrhea -Initially thought to have SBP (repeat ascitic fluid studies from 8/15 pending), but stool panel has resulted with plesimonas shigelloides and enteropathogenic E Coli. -Continue rocephin. -Much improved, diarrhea resolved. -Can do cipro at time of DC. -GI is on board.  Gram positive Bacteremia -Blood and ascitic fluid with CNS, oxacillin resistant. -Will add DC.  Pancytopenia -Due to cirrhosis, avoid heparin products.   Alcoholic cirrhosis  -With ascites, continue propranolol given esophageal varices. -Continue Lasix/spironolactone. -Had paracentesis on 8/15 (diagnostic only as not enough fluid)  Insulin-dependent diabetes -CBGs improved, slightly hypoglycemic this am, will decrease lantus further to 65.  DVT prophylaxis: SCDs Code Status: Full Code Family Communication: Patient only Disposition Plan: To be determined  Consultants:   GI  Procedures:   None  Antimicrobials:   Rocephin   vancomycin   Subjective: Feels better, less diarrhea  Objective: Vitals:   01/13/16 1340 01/13/16 2202 01/14/16 0703 01/14/16 1304  BP: (!) 108/52 (!) 107/57 (!) 113/58 (!) 115/44  Pulse: 75 79 72 73  Resp: 20 20 20 20   Temp: 98.4 F (36.9 C) 98.6 F (37 C) 97.8 F (36.6 C) 98.5 F (36.9 C)  TempSrc: Oral Oral Oral Oral  SpO2: 100% 96% 99% 98%  Weight:      Height:        Intake/Output Summary (Last 24 hours) at  01/14/16 1532 Last data filed at 01/14/16 1128  Gross per 24 hour  Intake              480 ml  Output             1101 ml  Net             -621 ml   Filed Weights   01/10/16 1039 01/10/16 1808  Weight: 101.6 kg (224 lb) 101.2 kg (223 lb 3.2 oz)    Examination:  General exam: Alert, awake, oriented x 3 Respiratory system: Clear to auscultation. Respiratory effort normal. Cardiovascular system:RRR. No murmurs, rubs, gallops. Gastrointestinal system: Abdomen is distended, +BS. Large ventral hernia with ulceration. Central nervous system: Alert and oriented. No focal neurological deficits. Extremities: No C/C/E, +pedal pulses Skin: No rashes, lesions or ulcers Psychiatry: Judgement and insight appear normal. Mood & affect appropriate.     Data Reviewed: I have personally reviewed following labs and imaging studies  CBC:  Recent Labs Lab 01/10/16 1045 01/11/16 0633  WBC 2.7* 3.1*  HGB 8.4* 7.9*  HCT 25.2* 24.0*  MCV 94.0 93.8  PLT 115* [id number]*   Basic Metabolic Panel:  Recent Labs Lab 01/10/16 1045 01/11/16 0633  NA 126* 129*  K 4.0 3.3*  CL 91* 98*  CO2 25 24  GLUCOSE 108* 63*  BUN 13 10  CREATININE 0.84 0.76  CALCIUM 8.4* 8.4*   GFR: Estimated Creatinine Clearance: 96.8 mL/min (by C-G formula based on SCr of 0.8 mg/dL). Liver Function Tests:  Recent Labs Lab 01/10/16 1045 01/11/16 0633  AST 31 31  ALT 23 19  ALKPHOS 77 69  BILITOT 1.3* 0.8  PROT 6.8 6.3*  ALBUMIN 3.1* 2.8*    Recent Labs Lab 01/10/16 1045  LIPASE 13   No results for input(s): AMMONIA in the last 168 hours. Coagulation Profile:  Recent Labs Lab 01/10/16 1421  INR 1.15   Cardiac Enzymes:  Recent Labs Lab 01/10/16 1225  TROPONINI <0.03   BNP (last 3 results) No results for input(s): PROBNP in the last 8760 hours. HbA1C: No results for input(s): HGBA1C in the last 72 hours. CBG:  Recent Labs Lab 01/13/16 1641 01/13/16 2121 01/14/16 0736 01/14/16 0832  01/14/16 1126  GLUCAP 179* 166* 61* 79 241*   Lipid Profile: No results for input(s): CHOL, HDL, LDLCALC, TRIG, CHOLHDL, LDLDIRECT in the last 72 hours. Thyroid Function Tests: No results for input(s): TSH, T4TOTAL, FREET4, T3FREE, THYROIDAB in the last 72 hours. Anemia Panel: No results for input(s): VITAMINB12, FOLATE, FERRITIN, TIBC, IRON, RETICCTPCT in the last 72 hours. Urine analysis:    Component Value Date/Time   COLORURINE YELLOW 01/10/2016 1110   APPEARANCEUR CLEAR 01/10/2016 1110   LABSPEC 1.010 01/10/2016 1110   PHURINE 6.0 01/10/2016 1110   GLUCOSEU NEGATIVE 01/10/2016 1110   HGBUR NEGATIVE 01/10/2016 1110   BILIRUBINUR NEGATIVE 01/10/2016 1110   KETONESUR NEGATIVE 01/10/2016 1110   PROTEINUR TRACE (A) 01/10/2016 1110   NITRITE NEGATIVE 01/10/2016 1110   LEUKOCYTESUR NEGATIVE 01/10/2016 1110   Sepsis Labs: @LABRCNTIP (procalcitonin:4,lacticidven:4)  ) Recent Results (from the past 240 hour(s))  Urine culture     Status: None   Collection Time: 01/10/16 11:10 AM  Result Value Ref Range Status   Specimen Description URINE, CLEAN CATCH  Final   Special Requests NONE  Final   Culture NO GROWTH Performed at [facility]   Final   Report Status 01/12/2016 FINAL  Final  Culture, blood (routine x 2)     Status: Abnormal   Collection Time: 01/10/16 11:44 AM  Result Value Ref Range Status   Specimen Description BLOOD  Final   Special Requests NONE  Final   Culture  Setup Time   Final    GRAM POSITIVE COCCI Gram Stain Report Called to,Read Back By and Verified With: [place], M AT 0835 ON 01/11/2016 BY WOODS,M AEROBIC BOTTLE ONLY    Culture (A)  Final    STAPHYLOCOCCUS SPECIES (COAGULASE NEGATIVE) THE SIGNIFICANCE OF ISOLATING THIS ORGANISM FROM A SINGLE SET OF BLOOD CULTURES WHEN MULTIPLE SETS ARE DRAWN IS UNCERTAIN. PLEASE NOTIFY THE MICROBIOLOGY DEPARTMENT WITHIN ONE WEEK IF SPECIATION AND SENSITIVITIES ARE REQUIRED. Performed at [facility]     Report Status 01/12/2016 FINAL  Final  Blood Culture ID Panel (Reflexed)     Status: Abnormal   Collection Time: 01/10/16 11:44 AM  Result Value Ref Range Status   Enterococcus species NOT DETECTED NOT DETECTED Final   Vancomycin resistance NOT DETECTED NOT DETECTED Final   Listeria monocytogenes NOT DETECTED NOT DETECTED Final   Staphylococcus species DETECTED (A) NOT DETECTED Final    Comment: CRITICAL RESULT CALLED TO, READ BACK BY AND VERIFIED WITH: TO STANDRA(RN) BY TCLEVELAND 01/11/2016 AT 10:21PM    Staphylococcus aureus NOT DETECTED NOT DETECTED Final   Methicillin resistance NOT DETECTED NOT DETECTED Final   Streptococcus species NOT DETECTED NOT DETECTED Final   Streptococcus agalactiae NOT DETECTED NOT DETECTED Final   Streptococcus pneumoniae NOT DETECTED NOT DETECTED Final   Streptococcus pyogenes NOT DETECTED NOT DETECTED Final   Acinetobacter baumannii NOT DETECTED NOT DETECTED  Final   Enterobacteriaceae species NOT DETECTED NOT DETECTED Final   Enterobacter cloacae complex NOT DETECTED NOT DETECTED Final   Escherichia coli NOT DETECTED NOT DETECTED Final   Klebsiella oxytoca NOT DETECTED NOT DETECTED Final   Klebsiella pneumoniae NOT DETECTED NOT DETECTED Final   Proteus species NOT DETECTED NOT DETECTED Final   Serratia marcescens NOT DETECTED NOT DETECTED Final   Carbapenem resistance NOT DETECTED NOT DETECTED Final   Haemophilus influenzae NOT DETECTED NOT DETECTED Final   Neisseria meningitidis NOT DETECTED NOT DETECTED Final   Pseudomonas aeruginosa NOT DETECTED NOT DETECTED Final   Candida albicans NOT DETECTED NOT DETECTED Final   Candida glabrata NOT DETECTED NOT DETECTED Final   Candida krusei NOT DETECTED NOT DETECTED Final   Candida parapsilosis NOT DETECTED NOT DETECTED Final   Candida tropicalis NOT DETECTED NOT DETECTED Final    Comment: Performed at [facility]  Culture, blood (routine x 2)     Status: None (Preliminary result)   Collection  Time: 01/10/16 12:24 PM  Result Value Ref Range Status   Specimen Description BLOOD  Final   Special Requests NONE  Final   Culture NO GROWTH 3 DAYS  Final   Report Status PENDING  Incomplete  Gram stain     Status: None   Collection Time: 01/10/16  3:10 PM  Result Value Ref Range Status   Specimen Description PERITONEAL  Final   Special Requests NONE  Final   Gram Stain   Final    CYTOSPIN SMEAR NO ORGANISMS SEEN WBC PRESENT,BOTH PMN AND MONONUCLEAR   Report Status 01/10/2016 FINAL  Final  Culture, body fluid-bottle     Status: Abnormal   Collection Time: 01/10/16  3:10 PM  Result Value Ref Range Status   Specimen Description PERITONEAL  Final   Special Requests BOTTLES DRAWN AEROBIC AND ANAEROBIC 6CC EACH  Final   Gram Stain   Final    GRAM POSITIVE COCCI Gram Stain Report Called to,Read Back By and Verified With: HANDY,S. AT 2352 ON 01/11/2016 BY AGUNDIZ,E. SEEN IN AEROBIC BOTTLE   Culture STAPHYLOCOCCUS SPECIES (COAGULASE NEGATIVE) (A)  Final   Report Status 01/14/2016 FINAL  Final   Organism ID, Bacteria STAPHYLOCOCCUS SPECIES (COAGULASE NEGATIVE)  Final      Susceptibility   Staphylococcus species (coagulase negative) - MIC*    CIPROFLOXACIN <=0.5 SENSITIVE Sensitive     ERYTHROMYCIN >=8 RESISTANT Resistant     GENTAMICIN <=0.5 SENSITIVE Sensitive     OXACILLIN >=4 RESISTANT Resistant     TETRACYCLINE 2 SENSITIVE Sensitive     VANCOMYCIN 2 SENSITIVE Sensitive     TRIMETH/SULFA 160 RESISTANT Resistant     CLINDAMYCIN <=0.25 SENSITIVE Sensitive     RIFAMPIN <=0.5 SENSITIVE Sensitive     Inducible Clindamycin NEGATIVE Sensitive     * STAPHYLOCOCCUS SPECIES (COAGULASE NEGATIVE)  Gastrointestinal Panel by PCR , Stool     Status: Abnormal   Collection Time: 01/10/16  4:30 PM  Result Value Ref Range Status   Campylobacter species NOT DETECTED NOT DETECTED Final   Plesimonas shigelloides DETECTED (A) NOT DETECTED Final    Comment: CRITICAL RESULT CALLED TO, READ BACK BY AND  VERIFIED WITH: Chilton Greathouse RN @ [id number] 01/11/16 by [place]    Salmonella species NOT DETECTED NOT DETECTED Final   Yersinia enterocolitica NOT DETECTED NOT DETECTED Final   Vibrio species NOT DETECTED NOT DETECTED Final   Vibrio cholerae NOT DETECTED NOT DETECTED Final   Enteroaggregative E coli (EAEC) NOT DETECTED  NOT DETECTED Final   Enteropathogenic E coli (EPEC) DETECTED (A) NOT DETECTED Final    Comment: CRITICAL RESULT CALLED TO, READ BACK BY AND VERIFIED WITH: Chilton Greathouse @ 1436 01/11/16 by [facility]    Enterotoxigenic E coli (ETEC) NOT DETECTED NOT DETECTED Final   Shiga like toxin producing E coli (STEC) NOT DETECTED NOT DETECTED Final   E. coli O157 NOT DETECTED NOT DETECTED Final   Shigella/Enteroinvasive E coli (EIEC) NOT DETECTED NOT DETECTED Final   Cryptosporidium NOT DETECTED NOT DETECTED Final   Cyclospora cayetanensis NOT DETECTED NOT DETECTED Final   Entamoeba histolytica NOT DETECTED NOT DETECTED Final   Giardia lamblia NOT DETECTED NOT DETECTED Final   Adenovirus F40/41 NOT DETECTED NOT DETECTED Final   Astrovirus NOT DETECTED NOT DETECTED Final   Norovirus GI/GII NOT DETECTED NOT DETECTED Final   Rotavirus A NOT DETECTED NOT DETECTED Final   Sapovirus (I, II, IV, and V) NOT DETECTED NOT DETECTED Final  C difficile quick scan w PCR reflex     Status: None   Collection Time: 01/10/16  4:30 PM  Result Value Ref Range Status   C Diff antigen NEGATIVE NEGATIVE Final   C Diff toxin NEGATIVE NEGATIVE Final   C Diff interpretation No C. difficile detected.  Final         Radiology Studies: US Paracentesis  Result Date: 01/14/2016 INDICATION: Positive ascitic fluid culture. EXAM: ULTRASOUND GUIDED DIAGNOSTIC PARACENTESIS MEDICATIONS: None. COMPLICATIONS: None immediate. PROCEDURE: Informed written consent was obtained from the patient after a discussion of the risks, benefits and alternatives to treatment. A timeout was performed prior to the initiation of the procedure.  Initial ultrasound scanning demonstrates a small amount of ascites. The safest site for diagnostic paracentesis was the umbilical hernia which only contained ascitic fluid. As before, there was a scab on the umbilical hernia, but no signs of active infection and the scab was generously avoided. The site was prepped and draped in the usual sterile fashion. Following this ascitic fluid was easily aspirated with a 22 gauge needle. An ultrasound image was saved for documentation purposes. The catheter was removed and a dressing was applied. The patient tolerated the procedure well without immediate post procedural complication. FINDINGS: A total of approximately 30 of tan fluid was removed. Samples were sent to the laboratory as requested by the clinical team. IMPRESSION: Successful ultrasound-guided diagnostic paracentesis yielding 30 cc of peritoneal fluid. Electronically Signed   By: Monte Fantasia M.D.   On: 01/14/2016 11:31        Scheduled Meds: . cefTRIAXone (ROCEPHIN)  IV  2 g Intravenous Q24H  . cyclobenzaprine  10 mg Oral Daily  . feeding supplement (ENSURE ENLIVE)  237 mL Oral BID BM  . furosemide  40 mg Oral Daily  . gabapentin  100 mg Oral TID  . glipiZIDE  10 mg Oral BID AC  . insulin aspart  0-5 Units Subcutaneous QHS  . insulin aspart  0-9 Units Subcutaneous TID WC  . insulin aspart  3 Units Subcutaneous TID WC  . insulin detemir  70 Units Subcutaneous QHS  . iron polysaccharides  150 mg Oral Daily  . lisinopril  10 mg Oral Daily  . multivitamin with minerals  1 tablet Oral Daily  . pantoprazole  40 mg Oral Daily  . pravastatin  20 mg Oral q1800  . propranolol  40 mg Oral BID  . sodium chloride flush  3 mL Intravenous Q12H  . spironolactone  25 mg Oral Daily  .  tamsulosin  0.4 mg Oral QPC supper  . vitamin B-1  250 mg Oral Daily  . venlafaxine  37.5 mg Oral BID   Continuous Infusions:    LOS: 4 days    Time spent: 25 minutes. Greater than 50% of this time was spent in  direct contact with the patient coordinating care.     Lelon Frohlich, MD [facility] Pager [phone]  If 7PM-7AM, please contact night-coverage [email or web address] Password [facility] 01/14/2016, 3:32 PM

## 2016-01-14 NOTE — Telephone Encounter (Signed)
Ginger, can you call [facility] please.

## 2016-01-14 NOTE — Telephone Encounter (Signed)
Patient states he had an appt at [facility] with a GI physician on 8/16, but I am unable to see who this may be. Can we call and let them know that he is inpatient currently? We need to cancel this and it will need to be postponed.

## 2016-01-14 NOTE — Care Management Note (Signed)
Case Management Note  Patient Details  Name: Jeffrey [name] MRN: [id number] Date of Birth: 1946-03-18  Subjective/Objective:                  Pt reviewed for CM needs. Pt admitted with decompensated cirrhosis and bacteremia. Pt is from home with, lives with spouse and is ind with ADL's. Pt has been up ambulating in room with no assistive device. Pt has PCP for f/u and insurance with drug coverage. Anticipate pt will return home with self care.   Action/Plan: No CM needs anticipated.   Expected Discharge Date:    01/16/2016              Expected Discharge Plan:  Home/Self Care  In-House Referral:  NA  Discharge planning Services  CM Consult  Post Acute Care Choice:  NA Choice offered to:  NA  DME Arranged:    DME Agency:     HH Arranged:    HH Agency:     Status of Service:  Completed, signed off  If discussed at [employer] of Stay Meetings, dates discussed:    Additional Comments:  Sherald Barge, RN 01/14/2016, 11:03 AM

## 2016-01-15 DIAGNOSIS — D61818 Other pancytopenia: Secondary | ICD-10-CM

## 2016-01-15 DIAGNOSIS — R188 Other ascites: Secondary | ICD-10-CM

## 2016-01-15 DIAGNOSIS — A09 Infectious gastroenteritis and colitis, unspecified: Secondary | ICD-10-CM

## 2016-01-15 DIAGNOSIS — K7030 Alcoholic cirrhosis of liver without ascites: Secondary | ICD-10-CM

## 2016-01-15 DIAGNOSIS — I8510 Secondary esophageal varices without bleeding: Secondary | ICD-10-CM

## 2016-01-15 LAB — CBC
HCT: 24.3 % — ABNORMAL LOW (ref 39.0–52.0)
Hemoglobin: 8.1 g/dL — ABNORMAL LOW (ref 13.0–17.0)
MCH: 30.7 pg (ref 26.0–34.0)
MCHC: 33.3 g/dL (ref 30.0–36.0)
MCV: 92 fL (ref 78.0–100.0)
Platelets: 164 10*3/uL (ref 150–400)
RBC: 2.64 MIL/uL — ABNORMAL LOW (ref 4.22–5.81)
RDW: 16.5 % — ABNORMAL HIGH (ref 11.5–15.5)
WBC: 3.9 10*3/uL — ABNORMAL LOW (ref 4.0–10.5)

## 2016-01-15 LAB — BODY FLUID CELL COUNT WITH DIFFERENTIAL
Eos, Fluid: 0 %
Lymphs, Fluid: 70 %
Monocyte-Macrophage-Serous Fluid: 28 % — ABNORMAL LOW (ref 50–90)
Neutrophil Count, Fluid: 0 % (ref 0–25)
Other Cells, Fluid: 2 %
Total Nucleated Cell Count, Fluid: UNDETERMINED cu mm (ref 0–1000)

## 2016-01-15 LAB — BASIC METABOLIC PANEL
Anion gap: 6 (ref 5–15)
BUN: 17 mg/dL (ref 6–20)
CO2: 26 mmol/L (ref 22–32)
Calcium: 8.7 mg/dL — ABNORMAL LOW (ref 8.9–10.3)
Chloride: 96 mmol/L — ABNORMAL LOW (ref 101–111)
Creatinine, Ser: 0.75 mg/dL (ref 0.61–1.24)
GFR calc Af Amer: >60 mL/min (ref >60)
GFR calc non Af Amer: >60 mL/min (ref >60)
Glucose, Bld: 61 mg/dL — ABNORMAL LOW (ref 65–99)
Potassium: 4.2 mmol/L (ref 3.5–5.1)
Sodium: 128 mmol/L — ABNORMAL LOW (ref 135–145)

## 2016-01-15 LAB — GRAM STAIN

## 2016-01-15 LAB — CULTURE, BLOOD (ROUTINE X 2): Culture: NO GROWTH

## 2016-01-15 LAB — OSMOLALITY, URINE: Osmolality, Ur: 138 mOsm/kg — ABNORMAL LOW (ref 300–900)

## 2016-01-15 LAB — GLUCOSE, CAPILLARY
Glucose-Capillary: 53 mg/dL — ABNORMAL LOW (ref 65–99)
Glucose-Capillary: 82 mg/dL (ref 65–99)
Glucose-Capillary: 173 mg/dL — ABNORMAL HIGH (ref 65–99)
Glucose-Capillary: 242 mg/dL — ABNORMAL HIGH (ref 65–99)

## 2016-01-15 LAB — OSMOLALITY: Osmolality: 280 mOsm/kg (ref 275–295)

## 2016-01-15 LAB — SODIUM, URINE, RANDOM: Sodium, Ur: 13 mmol/L

## 2016-01-15 MED ORDER — SACCHAROMYCES BOULARDII 250 MG PO CAPS: 250.0000 mg | ORAL_CAPSULE | Freq: Two times a day (BID) | ORAL | 0 refills | Status: DC

## 2016-01-15 MED ORDER — INSULIN DETEMIR 100 UNIT/ML FLEXPEN: 50.0000 [IU] | PEN_INJECTOR | Freq: Every day | SUBCUTANEOUS | 11 refills | Status: DC

## 2016-01-15 MED ORDER — CIPROFLOXACIN HCL 500 MG PO TABS: 500.0000 mg | ORAL_TABLET | Freq: Two times a day (BID) | ORAL | 0 refills | Status: DC

## 2016-01-15 MED ORDER — INSULIN DETEMIR 100 UNIT/ML ~~LOC~~ SOLN
50.0000 [IU] | Freq: Every day | SUBCUTANEOUS | Status: DC
  Filled 2016-01-15: qty 0.5

## 2016-01-15 NOTE — Progress Notes (Signed)
Inpatient Diabetes Program Recommendations  AACE/ADA: New Consensus Statement on Inpatient Glycemic Control (2015)  Target Ranges:  Prepandial:   less than 140 mg/dL      Peak postprandial:   less than 180 mg/dL (1-2 hours)      Critically ill patients:  140 - 180 mg/dL   Results for MONTY, CALIENDO (MRN [id number]) as of 01/15/2016 11:12  Ref. Range 01/14/2016 07:36 01/14/2016 08:32 01/14/2016 11:26 01/14/2016 16:10 01/14/2016 20:39 01/15/2016 07:53 01/15/2016 08:51  Glucose-Capillary Latest Ref Range: 65 - 99 mg/dL 61 (L) 79 241 (H) 162 (H) 216 (H) 53 (L) 82   Review of Glycemic Control  Diabetes history: DM2 Outpatient Diabetes medications: Levemir 80 units QHS, Januvia 100 mg daily, Glipizide 10 mg BID, Metformin 1000 mg BID Current orders for Inpatient glycemic control: Levemir 65 QHS, Novolog 0-9 units TID with meals, Novolog 0-5 units QHS, Novolog 3 units TID with meals for meal coverage,   Inpatient Diabetes Program Recommendations: Oral Agents: While inpatient, please consider discontinuing Glipizide to prevent further episodes of hypoglycemia.  Thanks, Barnie Alderman, RN, MSN, CDE Diabetes Coordinator Inpatient Diabetes Program [phone] (Team Pager from [place] to [place]) [phone] ([facility] office) [phone] [facility] office) [phone] [facility] office)

## 2016-01-15 NOTE — Telephone Encounter (Signed)
Talked with [facility] this morning and canceled his appointment for today and rescheduled him for 01/30/16 @ 11:00. They will mail him a new appointment letter.

## 2016-01-15 NOTE — Progress Notes (Signed)
Pt IV removed, tolerated well.  Reviewed discharge instructions with pt and answered questions at this time.   

## 2016-01-15 NOTE — Progress Notes (Signed)
Hypoglycemic Event  CBG: 52  Treatment: 15 GM carbohydrate snack  Symptoms: None  Follow-up CBG: Time:0851 CBG Result: 82  Possible Reasons for Event: Unknown  Comments/MD notified:Notified Dr Wyline Copas via [place], [place]

## 2016-01-15 NOTE — Discharge Summary (Signed)
Physician Discharge Summary  Jeffrey [name] [id number] DOB: Nov 06, 1945 DOA: 01/10/2016  PCP: Monico Blitz, MD  Admit date: 01/10/2016 Discharge date: 01/15/2016  Admitted From: Home Disposition:  Home  Recommendations for Outpatient Follow-up:  1. Follow up with PCP in 1-2 weeks 2. Please obtain CMP/CBC in one week  Discharge Condition: Improved CODE STATUS: Full Diet recommendation: Low-sodium   Brief/Interim Summary: 70 y.o. male with history of alcoholic cirrhosis, diabetes, esophageal varices, splenomegaly and hypertension who presents to the hospital today for 2 days worth of diarrhea. diarrhea is watery in consistency, has been having anywhere from 5-7 episodes a day. Of note there is a homeless man who has been living with him who has been diagnosed with C. difficile. He also has abdominal pain not related to food intake. On arrival was noted to have a temperature of 100.4. Because of consideration for SBP a paracentesis was obtained that shows 409 WBCs. Admission has been requested for further evaluation and management.  Abdominal Pain/Diarrhea -Initially thought to have SBP (repeat ascitic fluid studies from 8/15 pending), but stool panel has resulted with plesimonas shigelloides and enteropathogenic E Coli. -Continued . rocephin. -Much improved, diarrhea resolved. -GI consulted and following -We'll discharge patient on ciprofloxacin to complete course of antibiotics -Discussed case with gastroenterology. Patient noted to have coag negative staph species and ascitic fluid. Ascitic fluid analysis is not suggestive of SBP. As such, likely contaminant.  Gram positive Bacteremia -Blood and ascitic fluid with CNS, oxacillin resistant. -Speciation confirms coag negative species in 1 out of 2 blood cultures, likely contaminant..  Pancytopenia -Likely secondary to cirrhosis, avoid heparin products.   Alcoholic cirrhosis  -With ascites, continue propranolol given esophageal  varices. -Continue Lasix/spironolactone. -Had paracentesis on 8/15 (diagnostic only as not enough fluid)  Insulin-dependent diabetes -CBGs improved, glucose still borderline low. Discussed case with diabetic coordinator. Plans to discontinue glipizide. Also decrease Lantus from 65 units to 50 units. Medication changes have been noted.  Discharge Diagnoses:  Active Problems:   Alcoholic cirrhosis of liver with ascites (HCC)   Pancytopenia (HCC)   Esophageal varices in alcoholic cirrhosis (HCC)   Diarrhea   Shigella dysenteriae   SBP (spontaneous bacterial peritonitis) ([place])   Ascites  Discharge Instructions    Medication List    STOP taking these medications   glipiZIDE 10 MG tablet Commonly known as:  GLUCOTROL   JANUVIA 100 MG tablet Generic drug:  sitaGLIPtin   metFORMIN 1000 MG tablet Commonly known as:  GLUCOPHAGE     TAKE these medications   ALPHA LIPOIC ACID PO Take 1 tablet by mouth daily.   Chromium Picolate 1000 MCG Tabs Take 1 tablet by mouth daily.   Cinnamon 500 MG capsule Take 500 mg by mouth daily.   ciprofloxacin 500 MG tablet Commonly known as:  CIPRO Take 1 tablet (500 mg total) by mouth 2 (two) times daily.   cyclobenzaprine 10 MG tablet Commonly known as:  FLEXERIL Take 10 mg by mouth daily.   Flax Seed Oil 1300 MG Caps Take 1 capsule by mouth daily.   FLUoxetine 20 MG capsule Commonly known as:  PROZAC Take 20 mg by mouth daily.   furosemide 40 MG tablet Commonly known as:  LASIX Take 40 mg by mouth daily.   gabapentin 100 MG capsule Commonly known as:  NEURONTIN Take 100 mg by mouth 3 (three) times daily.   Garlic 123XX123 MG Caps Take 1 capsule by mouth daily.   HYDROcodone-acetaminophen 10-325 MG tablet Commonly known as:  NORCO  Take 1 tablet by mouth every 6 (six) hours as needed.   Insulin Detemir 100 UNIT/ML Pen Commonly known as:  LEVEMIR FLEXPEN Inject 50 Units into the skin at bedtime. What changed:  how much to  take   iron polysaccharides 150 MG capsule Commonly known as:  NIFEREX Take 150 mg by mouth daily.   lisinopril 10 MG tablet Commonly known as:  PRINIVIL,ZESTRIL Take 10 mg by mouth daily.   Lutein 10 MG Tabs Take 1 tablet by mouth daily.   milk thistle 175 MG tablet Take 175 mg by mouth daily.   multivitamin capsule Take 1 capsule by mouth daily.   NOVOLOG FLEXPEN 100 UNIT/ML injection Generic drug:  insulin aspart Inject 30 Units into the skin every evening.   omeprazole 20 MG capsule Commonly known as:  PRILOSEC TAKE 1 CAPSULE BY MOUTH EVERY DAY What changed:  See the new instructions.   pravastatin 20 MG tablet Commonly known as:  PRAVACHOL Take 20 mg by mouth daily.   propranolol 40 MG tablet Commonly known as:  INDERAL TAKE 1 TABLET BY MOUTH TWICE DAILY   ranitidine 150 MG tablet Commonly known as:  ZANTAC Take 150 mg by mouth 2 (two) times daily.   saccharomyces boulardii 250 MG capsule Commonly known as:  FLORASTOR Take 1 capsule (250 mg total) by mouth 2 (two) times daily.   spironolactone 25 MG tablet Commonly known as:  ALDACTONE Take 25 mg by mouth daily.   tamsulosin 0.4 MG Caps capsule Commonly known as:  FLOMAX Take 0.4 mg by mouth.   triamcinolone cream 0.1 % Commonly known as:  KENALOG Apply 1 application topically 2 (two) times daily.   venlafaxine 37.5 MG tablet Commonly known as:  EFFEXOR Take 37.5 mg by mouth 2 (two) times daily.   VITAMIN B 12 PO Take 1 tablet by mouth daily.   vitamin B-1 250 MG tablet Take 250 mg by mouth daily.   vitamin C 1000 MG tablet Take 1,000 mg by mouth daily.   VITAMIN D-3 PO Take 1 tablet by mouth daily.   vitamin E 400 UNIT capsule Take 400 Units by mouth daily.      Follow-up Information    SHAH,ASHISH, MD. Schedule an appointment as soon as possible for a visit in 1 week(s).   Specialty:  Internal Medicine Contact information: [place] [phone]           No Known Allergies  Consultations:  GI  Procedures/Studies: Dg Chest 2 View  Result Date: 01/10/2016 CLINICAL DATA:  Nausea, vomiting, diarrhea, and fever for 3 days. Received blood last Friday because of bleeding polyps. 10/02/2013 EXAM: CHEST  2 VIEW COMPARISON:  10/02/2013 FINDINGS: Shallow lung inflation. Heart size is enlarged. There are no focal consolidations or pleural effusions. Moderate mid thoracic spondylosis noted. No free air beneath the diaphragm. IMPRESSION: No evidence for acute cardiopulmonary abnormality. Electronically Signed   By: Nolon Nations M.D.   On: 01/10/2016 12:55   Ct Abdomen Pelvis W Contrast  Result Date: 01/10/2016 CLINICAL DATA:  Nausea and diarrhea EXAM: CT ABDOMEN AND PELVIS WITH CONTRAST TECHNIQUE: Multidetector CT imaging of the abdomen and pelvis was performed using the standard protocol following bolus administration of intravenous contrast. CONTRAST:  157mL ISOVUE-300 IOPAMIDOL (ISOVUE-300) INJECTION 61% COMPARISON:  09/02/2015 FINDINGS: Lower chest:  No acute findings. Hepatobiliary: Changes of underlying cirrhosis are noted. Differential enhancement is noted related to the underlying cirrhosis. No definitive mass lesion is seen. Gallbladder is well  distended with multiple gallstones. Pancreas: No mass, inflammatory changes, or other significant abnormality. Spleen: Spleen is enlarged consistent with underlying portal hypertension related to the ascites. A spontaneous splenorenal shunt is noted. Perisplenic varices are noted. No definitive esophageal or perigastric varices are seen. Adrenals/Urinary Tract: The adrenal glands are within normal limits. Kidney show no renal calculi or obstructive changes. Delayed images demonstrate normal excretion of contrast. Bladder is within normal limits. Stomach/Bowel: No evidence of obstruction, inflammatory process, or abnormal fluid collections. The appendix is not visualize consistent with a prior surgical history.  Vascular/Lymphatic: Aortic calcifications are noted without aneurysmal dilatation. Considerable increase perirectal venous engorgement is noted consistent with the portal hypertension. No significant lymphadenopathy is identified. Reproductive: No mass or other significant abnormality. Other: An abdominal wall hernia is noted in the periumbilical region containing a small amount of omental fat and ascites. Mild moderate ascites is noted. Musculoskeletal:  No suspicious bone lesions identified. IMPRESSION: Changes consistent with cirrhosis of the liver and portal hypertension with ascites and significant increased perirectal varices. Cholelithiasis without complicating factors. Anterior abdominal wall hernia Electronically Signed   By: Inez Catalina M.D.   On: 01/10/2016 13:46   US Paracentesis  Result Date: 01/14/2016 INDICATION: Positive ascitic fluid culture. EXAM: ULTRASOUND GUIDED DIAGNOSTIC PARACENTESIS MEDICATIONS: None. COMPLICATIONS: None immediate. PROCEDURE: Informed written consent was obtained from the patient after a discussion of the risks, benefits and alternatives to treatment. A timeout was performed prior to the initiation of the procedure. Initial ultrasound scanning demonstrates a small amount of ascites. The safest site for diagnostic paracentesis was the umbilical hernia which only contained ascitic fluid. As before, there was a scab on the umbilical hernia, but no signs of active infection and the scab was generously avoided. The site was prepped and draped in the usual sterile fashion. Following this ascitic fluid was easily aspirated with a 22 gauge needle. An ultrasound image was saved for documentation purposes. The catheter was removed and a dressing was applied. The patient tolerated the procedure well without immediate post procedural complication. FINDINGS: A total of approximately 30 of tan fluid was removed. Samples were sent to the laboratory as requested by the clinical team.  IMPRESSION: Successful ultrasound-guided diagnostic paracentesis yielding 30 cc of peritoneal fluid. Electronically Signed   By: Monte Fantasia M.D.   On: 01/14/2016 11:31   US Paracentesis  Result Date: 01/10/2016 INDICATION: Ascites and fever. Mild abdominal pain. Diagnostic paracentesis for spontaneous bacterial peritonitis EXAM: ULTRASOUND GUIDED  PARACENTESIS MEDICATIONS: None. COMPLICATIONS: None immediate. PROCEDURE: Informed written consent was obtained from the patient after a discussion of the risks, benefits and alternatives to treatment. A timeout was performed prior to the initiation of the procedure. Initial ultrasound scanning demonstrates a small amount of ascites distributed throughout the abdomen. After discussion with ordering provider, need for diagnostic sampling was apparent. The status pocket is within an umbilical hernia which head no precluding internal vessels or bowel. There was a small scab on the umbilical hernia skin, but no surrounding cellulitic changes and this area was generously avoided. The hernia was prepped and draped in the usual sterile fashion. Using sonographic guidance hernia was punctured with a 22 gauge needle and 30 cc of straw colored fluid was aspirated. An ultrasound image was saved for documentation purposes. A dressing was applied. The patient tolerated the procedure well without immediate post procedural complication. FINDINGS: A total of approximately 30 cc of straw-colored fluid was removed. Samples were sent to the laboratory as requested by the clinical  team. IMPRESSION: Successful ultrasound-guided diagnostic paracentesis. Electronically Signed   By: Monte Fantasia M.D.   On: 01/10/2016 15:39    Subjective: Reports feeling better today. No further diarrhea  Discharge Exam: Vitals:   01/15/16 0556 01/15/16 1335  BP: 118/65 (!) 118/50  Pulse: 78 76  Resp: 18 18  Temp: 98.4 F (36.9 C) 97.6 F (36.4 C)   Vitals:   01/14/16 2000 01/14/16  2035 01/15/16 0556 01/15/16 1335  BP:  (!) 110/45 118/65 (!) 118/50  Pulse:  83 78 76  Resp:  18 18 18   Temp:  98.3 F (36.8 C) 98.4 F (36.9 C) 97.6 F (36.4 C)  TempSrc:  Oral Oral Oral  SpO2: 92% 95% 96% 99%  Weight:      Height:        General: Pt is alert, awake, not in acute distress Cardiovascular: RRR, S1/S2 +, no rubs, no gallops Respiratory: CTA bilaterally, no wheezing, no rhonchi Abdominal: Soft, NT, ND, bowel sounds +, very pronounced abdominal hernia  Extremities: no edema, no cyanosis   The results of significant diagnostics from this hospitalization (including imaging, microbiology, ancillary and laboratory) are listed below for reference.     Microbiology: Recent Results (from the past 240 hour(s))  Urine culture     Status: None   Collection Time: 01/10/16 11:10 AM  Result Value Ref Range Status   Specimen Description URINE, CLEAN CATCH  Final   Special Requests NONE  Final   Culture NO GROWTH Performed at [facility]   Final   Report Status 01/12/2016 FINAL  Final  Culture, blood (routine x 2)     Status: Abnormal   Collection Time: 01/10/16 11:44 AM  Result Value Ref Range Status   Specimen Description BLOOD  Final   Special Requests NONE  Final   Culture  Setup Time   Final    GRAM POSITIVE COCCI Gram Stain Report Called to,Read Back By and Verified With: Holloway, M AT 0835 ON 01/11/2016 BY WOODS,M AEROBIC BOTTLE ONLY    Culture (A)  Final    STAPHYLOCOCCUS SPECIES (COAGULASE NEGATIVE) THE SIGNIFICANCE OF ISOLATING THIS ORGANISM FROM A SINGLE SET OF BLOOD CULTURES WHEN MULTIPLE SETS ARE DRAWN IS UNCERTAIN. PLEASE NOTIFY THE MICROBIOLOGY DEPARTMENT WITHIN ONE WEEK IF SPECIATION AND SENSITIVITIES ARE REQUIRED. Performed at [facility]    Report Status 01/12/2016 FINAL  Final  Blood Culture ID Panel (Reflexed)     Status: Abnormal   Collection Time: 01/10/16 11:44 AM  Result Value Ref Range Status   Enterococcus species NOT  DETECTED NOT DETECTED Final   Vancomycin resistance NOT DETECTED NOT DETECTED Final   Listeria monocytogenes NOT DETECTED NOT DETECTED Final   Staphylococcus species DETECTED (A) NOT DETECTED Final    Comment: CRITICAL RESULT CALLED TO, READ BACK BY AND VERIFIED WITH: TO STANDRA(RN) BY TCLEVELAND 01/11/2016 AT 10:21PM    Staphylococcus aureus NOT DETECTED NOT DETECTED Final   Methicillin resistance NOT DETECTED NOT DETECTED Final   Streptococcus species NOT DETECTED NOT DETECTED Final   Streptococcus agalactiae NOT DETECTED NOT DETECTED Final   Streptococcus pneumoniae NOT DETECTED NOT DETECTED Final   Streptococcus pyogenes NOT DETECTED NOT DETECTED Final   Acinetobacter baumannii NOT DETECTED NOT DETECTED Final   Enterobacteriaceae species NOT DETECTED NOT DETECTED Final   Enterobacter cloacae complex NOT DETECTED NOT DETECTED Final   Escherichia coli NOT DETECTED NOT DETECTED Final   Klebsiella oxytoca NOT DETECTED NOT DETECTED Final   Klebsiella pneumoniae NOT DETECTED  NOT DETECTED Final   Proteus species NOT DETECTED NOT DETECTED Final   Serratia marcescens NOT DETECTED NOT DETECTED Final   Carbapenem resistance NOT DETECTED NOT DETECTED Final   Haemophilus influenzae NOT DETECTED NOT DETECTED Final   Neisseria meningitidis NOT DETECTED NOT DETECTED Final   Pseudomonas aeruginosa NOT DETECTED NOT DETECTED Final   Candida albicans NOT DETECTED NOT DETECTED Final   Candida glabrata NOT DETECTED NOT DETECTED Final   Candida krusei NOT DETECTED NOT DETECTED Final   Candida parapsilosis NOT DETECTED NOT DETECTED Final   Candida tropicalis NOT DETECTED NOT DETECTED Final    Comment: Performed at [facility]  Culture, blood (routine x 2)     Status: None   Collection Time: 01/10/16 12:24 PM  Result Value Ref Range Status   Specimen Description BLOOD RIGHT HAND DRAWN BY RN  Final   Special Requests BOTTLES DRAWN AEROBIC ONLY 4CC  Final   Culture NO GROWTH 5 DAYS  Final    Report Status 01/15/2016 FINAL  Final  Gram stain     Status: None   Collection Time: 01/10/16  3:10 PM  Result Value Ref Range Status   Specimen Description PERITONEAL  Final   Special Requests NONE  Final   Gram Stain   Final    CYTOSPIN SMEAR NO ORGANISMS SEEN WBC PRESENT,BOTH PMN AND MONONUCLEAR   Report Status 01/10/2016 FINAL  Final  Culture, body fluid-bottle     Status: Abnormal   Collection Time: 01/10/16  3:10 PM  Result Value Ref Range Status   Specimen Description PERITONEAL  Final   Special Requests BOTTLES DRAWN AEROBIC AND ANAEROBIC 6CC EACH  Final   Gram Stain   Final    GRAM POSITIVE COCCI Gram Stain Report Called to,Read Back By and Verified With: HANDY,S. AT 2352 ON 01/11/2016 BY AGUNDIZ,E. SEEN IN AEROBIC BOTTLE   Culture STAPHYLOCOCCUS SPECIES (COAGULASE NEGATIVE) (A)  Final   Report Status 01/14/2016 FINAL  Final   Organism ID, Bacteria STAPHYLOCOCCUS SPECIES (COAGULASE NEGATIVE)  Final      Susceptibility   Staphylococcus species (coagulase negative) - MIC*    CIPROFLOXACIN <=0.5 SENSITIVE Sensitive     ERYTHROMYCIN >=8 RESISTANT Resistant     GENTAMICIN <=0.5 SENSITIVE Sensitive     OXACILLIN >=4 RESISTANT Resistant     TETRACYCLINE 2 SENSITIVE Sensitive     VANCOMYCIN 2 SENSITIVE Sensitive     TRIMETH/SULFA 160 RESISTANT Resistant     CLINDAMYCIN <=0.25 SENSITIVE Sensitive     RIFAMPIN <=0.5 SENSITIVE Sensitive     Inducible Clindamycin NEGATIVE Sensitive     * STAPHYLOCOCCUS SPECIES (COAGULASE NEGATIVE)  Gastrointestinal Panel by PCR , Stool     Status: Abnormal   Collection Time: 01/10/16  4:30 PM  Result Value Ref Range Status   Campylobacter species NOT DETECTED NOT DETECTED Final   Plesimonas shigelloides DETECTED (A) NOT DETECTED Final    Comment: CRITICAL RESULT CALLED TO, READ BACK BY AND VERIFIED WITH: Chilton Greathouse RN @ [id number] 01/11/16 by [place]    Salmonella species NOT DETECTED NOT DETECTED Final   Yersinia enterocolitica NOT DETECTED NOT  DETECTED Final   Vibrio species NOT DETECTED NOT DETECTED Final   Vibrio cholerae NOT DETECTED NOT DETECTED Final   Enteroaggregative E coli (EAEC) NOT DETECTED NOT DETECTED Final   Enteropathogenic E coli (EPEC) DETECTED (A) NOT DETECTED Final    Comment: CRITICAL RESULT CALLED TO, READ BACK BY AND VERIFIED WITH: Chilton Greathouse @ [id number] 01/11/16 by [facility]  Enterotoxigenic E coli (ETEC) NOT DETECTED NOT DETECTED Final   Shiga like toxin producing E coli (STEC) NOT DETECTED NOT DETECTED Final   E. coli O157 NOT DETECTED NOT DETECTED Final   Shigella/Enteroinvasive E coli (EIEC) NOT DETECTED NOT DETECTED Final   Cryptosporidium NOT DETECTED NOT DETECTED Final   Cyclospora cayetanensis NOT DETECTED NOT DETECTED Final   Entamoeba histolytica NOT DETECTED NOT DETECTED Final   Giardia lamblia NOT DETECTED NOT DETECTED Final   Adenovirus F40/41 NOT DETECTED NOT DETECTED Final   Astrovirus NOT DETECTED NOT DETECTED Final   Norovirus GI/GII NOT DETECTED NOT DETECTED Final   Rotavirus A NOT DETECTED NOT DETECTED Final   Sapovirus (I, II, IV, and V) NOT DETECTED NOT DETECTED Final  C difficile quick scan w PCR reflex     Status: None   Collection Time: 01/10/16  4:30 PM  Result Value Ref Range Status   C Diff antigen NEGATIVE NEGATIVE Final   C Diff toxin NEGATIVE NEGATIVE Final   C Diff interpretation No C. difficile detected.  Final     Labs: BNP (last 3 results) No results for input(s): BNP in the last 8760 hours. Basic Metabolic Panel:  Recent Labs Lab 01/10/16 1045 01/11/16 0633 01/15/16 0723  NA 126* 129* 128*  K 4.0 3.3* 4.2  CL 91* 98* 96*  CO2 25 24 26   GLUCOSE 108* 63* 61*  BUN 13 10 17   CREATININE 0.84 0.76 0.75  CALCIUM 8.4* 8.4* 8.7*   Liver Function Tests:  Recent Labs Lab 01/10/16 1045 01/11/16 0633  AST 31 31  ALT 23 19  ALKPHOS 77 69  BILITOT 1.3* 0.8  PROT 6.8 6.3*  ALBUMIN 3.1* 2.8*    Recent Labs Lab 01/10/16 1045  LIPASE 13   No results for  input(s): AMMONIA in the last 168 hours. CBC:  Recent Labs Lab 01/10/16 1045 01/11/16 0633 01/15/16 0723  WBC 2.7* 3.1* 3.9*  HGB 8.4* 7.9* 8.1*  HCT 25.2* 24.0* 24.3*  MCV 94.0 93.8 92.0  PLT 115* 123* 164   Cardiac Enzymes:  Recent Labs Lab 01/10/16 1225  TROPONINI <0.03   BNP: Invalid input(s): POCBNP CBG:  Recent Labs Lab 01/14/16 2039 01/15/16 0753 01/15/16 0851 01/15/16 1134 01/15/16 1645  GLUCAP 216* 53* 82 173* 242*   D-Dimer No results for input(s): DDIMER in the last 72 hours. Hgb A1c No results for input(s): HGBA1C in the last 72 hours. Lipid Profile No results for input(s): CHOL, HDL, LDLCALC, TRIG, CHOLHDL, LDLDIRECT in the last 72 hours. Thyroid function studies No results for input(s): TSH, T4TOTAL, T3FREE, THYROIDAB in the last 72 hours.  Invalid input(s): FREET3 Anemia work up No results for input(s): VITAMINB12, FOLATE, FERRITIN, TIBC, IRON, RETICCTPCT in the last 72 hours. Urinalysis    Component Value Date/Time   COLORURINE YELLOW 01/10/2016 1110   APPEARANCEUR CLEAR 01/10/2016 1110   LABSPEC 1.010 01/10/2016 1110   PHURINE 6.0 01/10/2016 1110   GLUCOSEU NEGATIVE 01/10/2016 1110   HGBUR NEGATIVE 01/10/2016 1110   BILIRUBINUR NEGATIVE 01/10/2016 1110   KETONESUR NEGATIVE 01/10/2016 1110   PROTEINUR TRACE (A) 01/10/2016 1110   NITRITE NEGATIVE 01/10/2016 1110   LEUKOCYTESUR NEGATIVE 01/10/2016 1110   Sepsis Labs Invalid input(s): PROCALCITONIN,  WBC,  LACTICIDVEN Microbiology Recent Results (from the past 240 hour(s))  Urine culture     Status: None   Collection Time: 01/10/16 11:10 AM  Result Value Ref Range Status   Specimen Description URINE, CLEAN CATCH  Final   Special Requests  NONE  Final   Culture NO GROWTH Performed at [facility]   Final   Report Status 01/12/2016 FINAL  Final  Culture, blood (routine x 2)     Status: Abnormal   Collection Time: 01/10/16 11:44 AM  Result Value Ref Range Status   Specimen  Description BLOOD  Final   Special Requests NONE  Final   Culture  Setup Time   Final    GRAM POSITIVE COCCI Gram Stain Report Called to,Read Back By and Verified With: [place], M AT 0835 ON 01/11/2016 BY WOODS,M AEROBIC BOTTLE ONLY    Culture (A)  Final    STAPHYLOCOCCUS SPECIES (COAGULASE NEGATIVE) THE SIGNIFICANCE OF ISOLATING THIS ORGANISM FROM A SINGLE SET OF BLOOD CULTURES WHEN MULTIPLE SETS ARE DRAWN IS UNCERTAIN. PLEASE NOTIFY THE MICROBIOLOGY DEPARTMENT WITHIN ONE WEEK IF SPECIATION AND SENSITIVITIES ARE REQUIRED. Performed at [facility]    Report Status 01/12/2016 FINAL  Final  Blood Culture ID Panel (Reflexed)     Status: Abnormal   Collection Time: 01/10/16 11:44 AM  Result Value Ref Range Status   Enterococcus species NOT DETECTED NOT DETECTED Final   Vancomycin resistance NOT DETECTED NOT DETECTED Final   Listeria monocytogenes NOT DETECTED NOT DETECTED Final   Staphylococcus species DETECTED (A) NOT DETECTED Final    Comment: CRITICAL RESULT CALLED TO, READ BACK BY AND VERIFIED WITH: TO STANDRA(RN) BY TCLEVELAND 01/11/2016 AT 10:21PM    Staphylococcus aureus NOT DETECTED NOT DETECTED Final   Methicillin resistance NOT DETECTED NOT DETECTED Final   Streptococcus species NOT DETECTED NOT DETECTED Final   Streptococcus agalactiae NOT DETECTED NOT DETECTED Final   Streptococcus pneumoniae NOT DETECTED NOT DETECTED Final   Streptococcus pyogenes NOT DETECTED NOT DETECTED Final   Acinetobacter baumannii NOT DETECTED NOT DETECTED Final   Enterobacteriaceae species NOT DETECTED NOT DETECTED Final   Enterobacter cloacae complex NOT DETECTED NOT DETECTED Final   Escherichia coli NOT DETECTED NOT DETECTED Final   Klebsiella oxytoca NOT DETECTED NOT DETECTED Final   Klebsiella pneumoniae NOT DETECTED NOT DETECTED Final   Proteus species NOT DETECTED NOT DETECTED Final   Serratia marcescens NOT DETECTED NOT DETECTED Final   Carbapenem resistance NOT DETECTED NOT  DETECTED Final   Haemophilus influenzae NOT DETECTED NOT DETECTED Final   Neisseria meningitidis NOT DETECTED NOT DETECTED Final   Pseudomonas aeruginosa NOT DETECTED NOT DETECTED Final   Candida albicans NOT DETECTED NOT DETECTED Final   Candida glabrata NOT DETECTED NOT DETECTED Final   Candida krusei NOT DETECTED NOT DETECTED Final   Candida parapsilosis NOT DETECTED NOT DETECTED Final   Candida tropicalis NOT DETECTED NOT DETECTED Final    Comment: Performed at [facility]  Culture, blood (routine x 2)     Status: None   Collection Time: 01/10/16 12:24 PM  Result Value Ref Range Status   Specimen Description BLOOD RIGHT HAND DRAWN BY RN  Final   Special Requests BOTTLES DRAWN AEROBIC ONLY 4CC  Final   Culture NO GROWTH 5 DAYS  Final   Report Status 01/15/2016 FINAL  Final  Gram stain     Status: None   Collection Time: 01/10/16  3:10 PM  Result Value Ref Range Status   Specimen Description PERITONEAL  Final   Special Requests NONE  Final   Gram Stain   Final    CYTOSPIN SMEAR NO ORGANISMS SEEN WBC PRESENT,BOTH PMN AND MONONUCLEAR   Report Status 01/10/2016 FINAL  Final  Culture, body fluid-bottle  Status: Abnormal   Collection Time: 01/10/16  3:10 PM  Result Value Ref Range Status   Specimen Description PERITONEAL  Final   Special Requests BOTTLES DRAWN AEROBIC AND ANAEROBIC 6CC EACH  Final   Gram Stain   Final    GRAM POSITIVE COCCI Gram Stain Report Called to,Read Back By and Verified With: HANDY,S. AT 2352 ON 01/11/2016 BY AGUNDIZ,E. SEEN IN AEROBIC BOTTLE   Culture STAPHYLOCOCCUS SPECIES (COAGULASE NEGATIVE) (A)  Final   Report Status 01/14/2016 FINAL  Final   Organism ID, Bacteria STAPHYLOCOCCUS SPECIES (COAGULASE NEGATIVE)  Final      Susceptibility   Staphylococcus species (coagulase negative) - MIC*    CIPROFLOXACIN <=0.5 SENSITIVE Sensitive     ERYTHROMYCIN >=8 RESISTANT Resistant     GENTAMICIN <=0.5 SENSITIVE Sensitive     OXACILLIN >=4 RESISTANT  Resistant     TETRACYCLINE 2 SENSITIVE Sensitive     VANCOMYCIN 2 SENSITIVE Sensitive     TRIMETH/SULFA 160 RESISTANT Resistant     CLINDAMYCIN <=0.25 SENSITIVE Sensitive     RIFAMPIN <=0.5 SENSITIVE Sensitive     Inducible Clindamycin NEGATIVE Sensitive     * STAPHYLOCOCCUS SPECIES (COAGULASE NEGATIVE)  Gastrointestinal Panel by PCR , Stool     Status: Abnormal   Collection Time: 01/10/16  4:30 PM  Result Value Ref Range Status   Campylobacter species NOT DETECTED NOT DETECTED Final   Plesimonas shigelloides DETECTED (A) NOT DETECTED Final    Comment: CRITICAL RESULT CALLED TO, READ BACK BY AND VERIFIED WITH: Chilton Greathouse RN @ [id number] 01/11/16 by [place]    Salmonella species NOT DETECTED NOT DETECTED Final   Yersinia enterocolitica NOT DETECTED NOT DETECTED Final   Vibrio species NOT DETECTED NOT DETECTED Final   Vibrio cholerae NOT DETECTED NOT DETECTED Final   Enteroaggregative E coli (EAEC) NOT DETECTED NOT DETECTED Final   Enteropathogenic E coli (EPEC) DETECTED (A) NOT DETECTED Final    Comment: CRITICAL RESULT CALLED TO, READ BACK BY AND VERIFIED WITH: Chilton Greathouse @ [id number] 01/11/16 by [facility]    Enterotoxigenic E coli (ETEC) NOT DETECTED NOT DETECTED Final   Shiga like toxin producing E coli (STEC) NOT DETECTED NOT DETECTED Final   E. coli O157 NOT DETECTED NOT DETECTED Final   Shigella/Enteroinvasive E coli (EIEC) NOT DETECTED NOT DETECTED Final   Cryptosporidium NOT DETECTED NOT DETECTED Final   Cyclospora cayetanensis NOT DETECTED NOT DETECTED Final   Entamoeba histolytica NOT DETECTED NOT DETECTED Final   Giardia lamblia NOT DETECTED NOT DETECTED Final   Adenovirus F40/41 NOT DETECTED NOT DETECTED Final   Astrovirus NOT DETECTED NOT DETECTED Final   Norovirus GI/GII NOT DETECTED NOT DETECTED Final   Rotavirus A NOT DETECTED NOT DETECTED Final   Sapovirus (I, II, IV, and V) NOT DETECTED NOT DETECTED Final  C difficile quick scan w PCR reflex     Status: None   Collection Time:  01/10/16  4:30 PM  Result Value Ref Range Status   C Diff antigen NEGATIVE NEGATIVE Final   C Diff toxin NEGATIVE NEGATIVE Final   C Diff interpretation No C. difficile detected.  Final     SIGNED:   Donne Hazel, MD  [facility] 01/15/2016, 5:24 PM  If 7PM-7AM, please contact night-coverage [email or web address] Password [email or web address]

## 2016-01-15 NOTE — Progress Notes (Signed)
Subjective:  No complaints. Tolerated diet.   Objective: Vital signs in last 24 hours: Temp:  [98.3 F (36.8 C)-98.5 F (36.9 C)] 98.4 F (36.9 C) (08/16 0556) Pulse Rate:  [73-83] 78 (08/16 0556) Resp:  [18-20] 18 (08/16 0556) BP: (110-118)/(44-65) 118/65 (08/16 0556) SpO2:  [92 %-98 %] 96 % (08/16 0556) Last BM Date: 01/14/16 General:   Alert,  Well-developed, well-nourished, pleasant and cooperative in NAD Head:  Normocephalic and atraumatic. Eyes:  Sclera clear, no icterus.  Chest: CTA bilaterally without rales, rhonchi, crackles.    Heart:  Regular rate and rhythm; no murmurs, clicks, rubs,  or gallops. Abdomen:  Soft, nontender and nondistended. No masses, hepatosplenomegaly or hernias noted. Normal bowel sounds, without guarding, and without rebound.   Extremities:  Without clubbing, deformity or edema. Neurologic:  Alert and  oriented x4;  grossly normal neurologically. Skin:  Intact without significant lesions or rashes. Psych:  Alert and cooperative. Normal mood and affect.  Intake/Output from previous day: 08/15 0701 - 08/16 0700 In: 680 [P.O.:480; IV Piggyback:200] Out: 1200 [Urine:1200] Intake/Output this shift: Total I/O In: 240 [P.O.:240] Out: 200 [Urine:200]  Lab Results: CBC  Recent Labs  01/15/16 0723  WBC 3.9*  HGB 8.1*  HCT 24.3*  MCV 92.0  PLT 164   BMET  Recent Labs  01/15/16 0723  NA 128*  K 4.2  CL 96*  CO2 26  GLUCOSE 61*  BUN 17  CREATININE 0.75  CALCIUM 8.7*   LFTs No results for input(s): BILITOT, BILIDIR, IBILI, ALKPHOS, AST, ALT, PROT, ALBUMIN in the last 72 hours. No results for input(s): LIPASE in the last 72 hours. PT/INR No results for input(s): LABPROT, INR in the last 72 hours.    Imaging Studies: Dg Chest 2 View  Result Date: 01/10/2016 CLINICAL DATA:  Nausea, vomiting, diarrhea, and fever for 3 days. Received blood last Friday because of bleeding polyps. 10/02/2013 EXAM: CHEST  2 VIEW COMPARISON:  10/02/2013  FINDINGS: Shallow lung inflation. Heart size is enlarged. There are no focal consolidations or pleural effusions. Moderate mid thoracic spondylosis noted. No free air beneath the diaphragm. IMPRESSION: No evidence for acute cardiopulmonary abnormality. Electronically Signed   By: Nolon Nations M.D.   On: 01/10/2016 12:55   Ct Abdomen Pelvis W Contrast  Result Date: 01/10/2016 CLINICAL DATA:  Nausea and diarrhea EXAM: CT ABDOMEN AND PELVIS WITH CONTRAST TECHNIQUE: Multidetector CT imaging of the abdomen and pelvis was performed using the standard protocol following bolus administration of intravenous contrast. CONTRAST:  146mL ISOVUE-300 IOPAMIDOL (ISOVUE-300) INJECTION 61% COMPARISON:  09/02/2015 FINDINGS: Lower chest:  No acute findings. Hepatobiliary: Changes of underlying cirrhosis are noted. Differential enhancement is noted related to the underlying cirrhosis. No definitive mass lesion is seen. Gallbladder is well distended with multiple gallstones. Pancreas: No mass, inflammatory changes, or other significant abnormality. Spleen: Spleen is enlarged consistent with underlying portal hypertension related to the ascites. A spontaneous splenorenal shunt is noted. Perisplenic varices are noted. No definitive esophageal or perigastric varices are seen. Adrenals/Urinary Tract: The adrenal glands are within normal limits. Kidney show no renal calculi or obstructive changes. Delayed images demonstrate normal excretion of contrast. Bladder is within normal limits. Stomach/Bowel: No evidence of obstruction, inflammatory process, or abnormal fluid collections. The appendix is not visualize consistent with a prior surgical history. Vascular/Lymphatic: Aortic calcifications are noted without aneurysmal dilatation. Considerable increase perirectal venous engorgement is noted consistent with the portal hypertension. No significant lymphadenopathy is identified. Reproductive: No mass or other significant abnormality.  Other: An abdominal wall hernia is noted in the periumbilical region containing a small amount of omental fat and ascites. Mild moderate ascites is noted. Musculoskeletal:  No suspicious bone lesions identified. IMPRESSION: Changes consistent with cirrhosis of the liver and portal hypertension with ascites and significant increased perirectal varices. Cholelithiasis without complicating factors. Anterior abdominal wall hernia Electronically Signed   By: Inez Catalina M.D.   On: 01/10/2016 13:46   US Paracentesis  Result Date: 01/14/2016 INDICATION: Positive ascitic fluid culture. EXAM: ULTRASOUND GUIDED DIAGNOSTIC PARACENTESIS MEDICATIONS: None. COMPLICATIONS: None immediate. PROCEDURE: Informed written consent was obtained from the patient after a discussion of the risks, benefits and alternatives to treatment. A timeout was performed prior to the initiation of the procedure. Initial ultrasound scanning demonstrates a small amount of ascites. The safest site for diagnostic paracentesis was the umbilical hernia which only contained ascitic fluid. As before, there was a scab on the umbilical hernia, but no signs of active infection and the scab was generously avoided. The site was prepped and draped in the usual sterile fashion. Following this ascitic fluid was easily aspirated with a 22 gauge needle. An ultrasound image was saved for documentation purposes. The catheter was removed and a dressing was applied. The patient tolerated the procedure well without immediate post procedural complication. FINDINGS: A total of approximately 30 of tan fluid was removed. Samples were sent to the laboratory as requested by the clinical team. IMPRESSION: Successful ultrasound-guided diagnostic paracentesis yielding 30 cc of peritoneal fluid. Electronically Signed   By: Monte Fantasia M.D.   On: 01/14/2016 11:31   US Paracentesis  Result Date: 01/10/2016 INDICATION: Ascites and fever. Mild abdominal pain. Diagnostic  paracentesis for spontaneous bacterial peritonitis EXAM: ULTRASOUND GUIDED  PARACENTESIS MEDICATIONS: None. COMPLICATIONS: None immediate. PROCEDURE: Informed written consent was obtained from the patient after a discussion of the risks, benefits and alternatives to treatment. A timeout was performed prior to the initiation of the procedure. Initial ultrasound scanning demonstrates a small amount of ascites distributed throughout the abdomen. After discussion with ordering provider, need for diagnostic sampling was apparent. The status pocket is within an umbilical hernia which head no precluding internal vessels or bowel. There was a small scab on the umbilical hernia skin, but no surrounding cellulitic changes and this area was generously avoided. The hernia was prepped and draped in the usual sterile fashion. Using sonographic guidance hernia was punctured with a 22 gauge needle and 30 cc of straw colored fluid was aspirated. An ultrasound image was saved for documentation purposes. A dressing was applied. The patient tolerated the procedure well without immediate post procedural complication. FINDINGS: A total of approximately 30 cc of straw-colored fluid was removed. Samples were sent to the laboratory as requested by the clinical team. IMPRESSION: Successful ultrasound-guided diagnostic paracentesis. Electronically Signed   By: Monte Fantasia M.D.   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 Treatment for Plesimonas shigelloides usually supportive but antibiotics could be considered in severe cases. Patient has improved with supportive measures.   Found to have gram-positive cocci coagulase negative through blood and ascitic fluid cultures but without evidence of SBP due to low neutrophil count in ascitic fluid Felt to possibly be a contaminant, and  repeat diagnostic paracentesis was performed yesterday, no results available. Radiologist, Dr. Pascal Lux, stated no significant ascites present.  Currently on Rocephin and vancomycin. Doubt dealing with  SBP and likely prior analysis was contaminant. Pharmacy on board.   Chronic anemia: without overt GI bleeding. Heme negative. Appointment was scheduled for today at [facility] for [place], transfusion dependent anemia, chronic occult gi bleeding. Has been rescheduled to 01/30/16 and patient aware. Large ventral hernia can be addressed at [facility] later as well.   Plan: 1. Called the lab. They received the ascitic fluid yesterday without orders. Specimen now compromised. Cell count may not be accurate. Gram stain ordered with cell count. Cultures cannot be done.  2. [facility] appointment rescheduled as outlined.  3. Patient needs outpatient follow-up for cirrhosis care. Previously seen by Dr. Laural Golden, Dr. Gala Romney, most recently by Dr. Britta Mccreedy. Patient prefers to have follow-up care by Dr. Laural Golden as Dr. Wylie Hail availability is limited. To discuss with Dr. Laural Golden to see if he would be willing to take over his care as an outpatient.   Laureen Ochs. Bernarda Caffey [facility] [phone] 8/16/201711:51 AM     LOS: 5 days

## 2016-01-15 NOTE — Care Management Important Message (Signed)
Important Message  Patient Details  Name: Jeffrey [name] MRN: [id number] Date of Birth: Feb 14, 1946   Medicare Important Message Given:  Yes    Sherald Barge, RN 01/15/2016, 3:53 PM

## 2016-02-10 ENCOUNTER — Telehealth: Payer: Self-pay | Admitting: Gastroenterology

## 2016-02-10 NOTE — Telephone Encounter (Signed)
Please note, patient has been seen by multiple GI providers (Dr. Gala Romney, Dr. Britta Mccreedy, Dr. Laural Golden) locally. Also currently seeing GI at [facility] for prescription GI bleeding.  Because Dr. Wylie Hail availability is limited patient wanted to transfer his care to [facility]. Per Dr. Laural Golden, patient needs to continue follow up with [facility] as we technically have provided most recent follow up care for this patient prior to recent hospitalization.      Almyra Free, can you please obtain recent GI ov from [facility] and any procedure notes. F/u here to be determined based on findings.

## 2016-02-10 NOTE — Telephone Encounter (Signed)
Records given to [facility]

## 2016-02-14 NOTE — Telephone Encounter (Signed)
Records from [place] reviewed. Patient saw Dr. Delight Stare, gastroenterology for alcoholic cirrhosis, decompensated with ascites, varices, duodenal polyps?.  Ongoing follow up and management planned there.   EGD done today, to be scanned. Showed portal hypertensive gastropathy, multiple sessile polyps in the gastric antrum ranging from 5 mm to 1.5 cm. Largest polyp was removed. Multiple sessile polyps measuring 3-12 mm in the duodenal bulb and second part of the duodenum status post multiple biopsies. Esophagus appeared to be normal. Path pending.   As patient is actively being seen at [facility], we will not schedule a follow up here unless patient request to continue care locally.

## 2016-03-02 ENCOUNTER — Telehealth: Payer: Self-pay | Admitting: Nurse Practitioner

## 2016-03-04 NOTE — Telephone Encounter (Signed)
Refilled propanolol X 2. Needs to get future refills from [facility] since they are managing him now.

## 2016-03-27 ENCOUNTER — Inpatient Hospital Stay
Admit: 2016-03-27 | Discharge: 2016-04-10 | Disposition: A | Discharge status: Skilled Nursing Facility | Payer: Self-pay | Source: Ambulatory Visit | Attending: Internal Medicine | Admitting: Internal Medicine

## 2016-03-27 ENCOUNTER — Institutional Professional Consult (permissible substitution) (HOSPITAL_COMMUNITY): Payer: Self-pay

## 2016-03-27 DIAGNOSIS — R140 Abdominal distension (gaseous): Secondary | ICD-10-CM

## 2016-03-28 LAB — CBC WITH DIFFERENTIAL/PLATELET
Band Neutrophils: 0 %
Basophils Absolute: 0 10*3/uL (ref 0.0–0.1)
Basophils Relative: 1 %
Blasts: 0 %
Eosinophils Absolute: 0.2 10*3/uL (ref 0.0–0.7)
Eosinophils Relative: 5 %
HCT: 27.4 % — ABNORMAL LOW (ref 39.0–52.0)
Hemoglobin: 8.2 g/dL — ABNORMAL LOW (ref 13.0–17.0)
Lymphocytes Relative: 13 %
Lymphs Abs: 0.6 10*3/uL — ABNORMAL LOW (ref 0.7–4.0)
MCH: 29 pg (ref 26.0–34.0)
MCHC: 29.9 g/dL — ABNORMAL LOW (ref 30.0–36.0)
MCV: 96.8 fL (ref 78.0–100.0)
Metamyelocytes Relative: 0 %
Monocytes Absolute: 0.4 10*3/uL (ref 0.1–1.0)
Monocytes Relative: 8 %
Myelocytes: 0 %
Neutro Abs: 3.2 10*3/uL (ref 1.7–7.7)
Neutrophils Relative %: 73 %
Other: 0 %
Platelets: 177 10*3/uL (ref 150–400)
Promyelocytes Absolute: 0 %
RBC: 2.83 MIL/uL — ABNORMAL LOW (ref 4.22–5.81)
RDW: 22.3 % — ABNORMAL HIGH (ref 11.5–15.5)
WBC: 4.4 10*3/uL (ref 4.0–10.5)
nRBC: 0 /100 WBC

## 2016-03-28 LAB — COMPREHENSIVE METABOLIC PANEL
ALT: 87 U/L — ABNORMAL HIGH (ref 17–63)
AST: 142 U/L — ABNORMAL HIGH (ref 15–41)
Albumin: 2.5 g/dL — ABNORMAL LOW (ref 3.5–5.0)
Alkaline Phosphatase: 504 U/L — ABNORMAL HIGH (ref 38–126)
Anion gap: 10 (ref 5–15)
BUN: 18 mg/dL (ref 6–20)
CO2: 31 mmol/L (ref 22–32)
Calcium: 9.2 mg/dL (ref 8.9–10.3)
Chloride: 94 mmol/L — ABNORMAL LOW (ref 101–111)
Creatinine, Ser: 1.04 mg/dL (ref 0.61–1.24)
GFR calc Af Amer: >60 mL/min (ref >60)
GFR calc non Af Amer: >60 mL/min (ref >60)
Glucose, Bld: 280 mg/dL — ABNORMAL HIGH (ref 65–99)
Potassium: 3.6 mmol/L (ref 3.5–5.1)
Sodium: 135 mmol/L (ref 135–145)
Total Bilirubin: 2.3 mg/dL — ABNORMAL HIGH (ref 0.3–1.2)
Total Protein: 6.7 g/dL (ref 6.5–8.1)

## 2016-03-28 LAB — PROTIME-INR
INR: 1.22
Prothrombin Time: 15.5 seconds — ABNORMAL HIGH (ref 11.4–15.2)

## 2016-03-29 LAB — BASIC METABOLIC PANEL
Anion gap: 9 (ref 5–15)
BUN: 17 mg/dL (ref 6–20)
CO2: 33 mmol/L — ABNORMAL HIGH (ref 22–32)
Calcium: 9.3 mg/dL (ref 8.9–10.3)
Chloride: 91 mmol/L — ABNORMAL LOW (ref 101–111)
Creatinine, Ser: 0.88 mg/dL (ref 0.61–1.24)
GFR calc Af Amer: >60 mL/min (ref >60)
GFR calc non Af Amer: >60 mL/min (ref >60)
Glucose, Bld: 262 mg/dL — ABNORMAL HIGH (ref 65–99)
Potassium: 4.3 mmol/L (ref 3.5–5.1)
Sodium: 133 mmol/L — ABNORMAL LOW (ref 135–145)

## 2016-03-29 LAB — CBC
HCT: 27.6 % — ABNORMAL LOW (ref 39.0–52.0)
Hemoglobin: 8.2 g/dL — ABNORMAL LOW (ref 13.0–17.0)
MCH: 29 pg (ref 26.0–34.0)
MCHC: 29.7 g/dL — ABNORMAL LOW (ref 30.0–36.0)
MCV: 97.5 fL (ref 78.0–100.0)
Platelets: 154 10*3/uL (ref 150–400)
RBC: 2.83 MIL/uL — ABNORMAL LOW (ref 4.22–5.81)
RDW: 22.4 % — ABNORMAL HIGH (ref 11.5–15.5)
WBC: 4.2 10*3/uL (ref 4.0–10.5)

## 2016-03-29 LAB — URINALYSIS, ROUTINE W REFLEX MICROSCOPIC
Bilirubin Urine: NEGATIVE
Glucose, UA: NEGATIVE mg/dL
Hgb urine dipstick: NEGATIVE
Ketones, ur: NEGATIVE mg/dL
Leukocytes, UA: NEGATIVE
Nitrite: NEGATIVE
Protein, ur: NEGATIVE mg/dL
Specific Gravity, Urine: 1.008 (ref 1.005–1.030)
pH: 8 (ref 5.0–8.0)

## 2016-03-29 LAB — AMMONIA: Ammonia: 70 umol/L — ABNORMAL HIGH (ref 9–35)

## 2016-03-30 LAB — URINE CULTURE

## 2016-03-30 LAB — AMMONIA: Ammonia: 52 umol/L — ABNORMAL HIGH (ref 9–35)

## 2016-03-30 LAB — BASIC METABOLIC PANEL
Anion gap: 8 (ref 5–15)
BUN: 19 mg/dL (ref 6–20)
CO2: 34 mmol/L — ABNORMAL HIGH (ref 22–32)
Calcium: 9.2 mg/dL (ref 8.9–10.3)
Chloride: 88 mmol/L — ABNORMAL LOW (ref 101–111)
Creatinine, Ser: 0.96 mg/dL (ref 0.61–1.24)
GFR calc Af Amer: >60 mL/min (ref >60)
GFR calc non Af Amer: >60 mL/min (ref >60)
Glucose, Bld: 301 mg/dL — ABNORMAL HIGH (ref 65–99)
Potassium: 4.4 mmol/L (ref 3.5–5.1)
Sodium: 130 mmol/L — ABNORMAL LOW (ref 135–145)

## 2016-03-31 LAB — CBC WITH DIFFERENTIAL/PLATELET
Basophils Absolute: 0.1 10*3/uL (ref 0.0–0.1)
Basophils Relative: 2 %
Eosinophils Absolute: 0.2 10*3/uL (ref 0.0–0.7)
Eosinophils Relative: 6 %
HCT: 26.7 % — ABNORMAL LOW (ref 39.0–52.0)
Hemoglobin: 8.2 g/dL — ABNORMAL LOW (ref 13.0–17.0)
Lymphocytes Relative: 14 %
Lymphs Abs: 0.5 10*3/uL — ABNORMAL LOW (ref 0.7–4.0)
MCH: 29.6 pg (ref 26.0–34.0)
MCHC: 30.7 g/dL (ref 30.0–36.0)
MCV: 96.4 fL (ref 78.0–100.0)
Monocytes Absolute: 0.4 10*3/uL (ref 0.1–1.0)
Monocytes Relative: 10 %
Neutro Abs: 2.7 10*3/uL (ref 1.7–7.7)
Neutrophils Relative %: 68 %
Platelets: 164 10*3/uL (ref 150–400)
RBC: 2.77 MIL/uL — ABNORMAL LOW (ref 4.22–5.81)
RDW: 22.4 % — ABNORMAL HIGH (ref 11.5–15.5)
WBC: 3.9 10*3/uL — ABNORMAL LOW (ref 4.0–10.5)

## 2016-03-31 LAB — URINALYSIS, ROUTINE W REFLEX MICROSCOPIC
Bilirubin Urine: NEGATIVE
Glucose, UA: NEGATIVE mg/dL
Hgb urine dipstick: NEGATIVE
Ketones, ur: NEGATIVE mg/dL
Leukocytes, UA: NEGATIVE
Nitrite: NEGATIVE
Protein, ur: NEGATIVE mg/dL
Specific Gravity, Urine: 1.011 (ref 1.005–1.030)
pH: 7.5 (ref 5.0–8.0)

## 2016-03-31 LAB — BASIC METABOLIC PANEL
Anion gap: 8 (ref 5–15)
BUN: 25 mg/dL — ABNORMAL HIGH (ref 6–20)
CO2: 33 mmol/L — ABNORMAL HIGH (ref 22–32)
Calcium: 9.1 mg/dL (ref 8.9–10.3)
Chloride: 89 mmol/L — ABNORMAL LOW (ref 101–111)
Creatinine, Ser: 0.94 mg/dL (ref 0.61–1.24)
GFR calc Af Amer: >60 mL/min (ref >60)
GFR calc non Af Amer: >60 mL/min (ref >60)
Glucose, Bld: 286 mg/dL — ABNORMAL HIGH (ref 65–99)
Potassium: 3.9 mmol/L (ref 3.5–5.1)
Sodium: 130 mmol/L — ABNORMAL LOW (ref 135–145)

## 2016-03-31 LAB — PHOSPHORUS: Phosphorus: 4.1 mg/dL (ref 2.5–4.6)

## 2016-03-31 LAB — MAGNESIUM: Magnesium: 2 mg/dL (ref 1.7–2.4)

## 2016-04-01 LAB — COMPREHENSIVE METABOLIC PANEL
ALT: 67 U/L — ABNORMAL HIGH (ref 17–63)
AST: 61 U/L — ABNORMAL HIGH (ref 15–41)
Albumin: 2.8 g/dL — ABNORMAL LOW (ref 3.5–5.0)
Alkaline Phosphatase: 470 U/L — ABNORMAL HIGH (ref 38–126)
Anion gap: 14 (ref 5–15)
BUN: 23 mg/dL — ABNORMAL HIGH (ref 6–20)
CO2: 29 mmol/L (ref 22–32)
Calcium: 9.5 mg/dL (ref 8.9–10.3)
Chloride: 89 mmol/L — ABNORMAL LOW (ref 101–111)
Creatinine, Ser: 0.89 mg/dL (ref 0.61–1.24)
GFR calc Af Amer: >60 mL/min (ref >60)
GFR calc non Af Amer: >60 mL/min (ref >60)
Glucose, Bld: 160 mg/dL — ABNORMAL HIGH (ref 65–99)
Potassium: 4.2 mmol/L (ref 3.5–5.1)
Sodium: 132 mmol/L — ABNORMAL LOW (ref 135–145)
Total Bilirubin: 1.5 mg/dL — ABNORMAL HIGH (ref 0.3–1.2)
Total Protein: 7.6 g/dL (ref 6.5–8.1)

## 2016-04-01 LAB — CBC WITH DIFFERENTIAL/PLATELET
Basophils Absolute: 0.1 10*3/uL (ref 0.0–0.1)
Basophils Relative: 2 %
Eosinophils Absolute: 0.1 10*3/uL (ref 0.0–0.7)
Eosinophils Relative: 3 %
HCT: 27.9 % — ABNORMAL LOW (ref 39.0–52.0)
Hemoglobin: 8.5 g/dL — ABNORMAL LOW (ref 13.0–17.0)
Lymphocytes Relative: 12 %
Lymphs Abs: 0.5 10*3/uL — ABNORMAL LOW (ref 0.7–4.0)
MCH: 29.2 pg (ref 26.0–34.0)
MCHC: 30.5 g/dL (ref 30.0–36.0)
MCV: 95.9 fL (ref 78.0–100.0)
Monocytes Absolute: 0.3 10*3/uL (ref 0.1–1.0)
Monocytes Relative: 8 %
Neutro Abs: 3.1 10*3/uL (ref 1.7–7.7)
Neutrophils Relative %: 75 %
Platelets: 164 10*3/uL (ref 150–400)
RBC: 2.91 MIL/uL — ABNORMAL LOW (ref 4.22–5.81)
RDW: 22.5 % — ABNORMAL HIGH (ref 11.5–15.5)
WBC: 4.1 10*3/uL (ref 4.0–10.5)

## 2016-04-01 LAB — URINE CULTURE: Culture: <10000 — AB

## 2016-04-01 LAB — AMMONIA: Ammonia: 55 umol/L — ABNORMAL HIGH (ref 9–35)

## 2016-04-01 LAB — PHOSPHORUS: Phosphorus: 4.8 mg/dL — ABNORMAL HIGH (ref 2.5–4.6)

## 2016-04-01 LAB — MAGNESIUM: Magnesium: 1.8 mg/dL (ref 1.7–2.4)

## 2016-04-07 LAB — COMPREHENSIVE METABOLIC PANEL
ALT: 35 U/L (ref 17–63)
AST: 43 U/L — ABNORMAL HIGH (ref 15–41)
Albumin: 3.1 g/dL — ABNORMAL LOW (ref 3.5–5.0)
Alkaline Phosphatase: 324 U/L — ABNORMAL HIGH (ref 38–126)
Anion gap: 17 — ABNORMAL HIGH (ref 5–15)
BUN: 23 mg/dL — ABNORMAL HIGH (ref 6–20)
CO2: 25 mmol/L (ref 22–32)
Calcium: 9.5 mg/dL (ref 8.9–10.3)
Chloride: 89 mmol/L — ABNORMAL LOW (ref 101–111)
Creatinine, Ser: 1.04 mg/dL (ref 0.61–1.24)
GFR calc Af Amer: >60 mL/min (ref >60)
GFR calc non Af Amer: >60 mL/min (ref >60)
Glucose, Bld: 145 mg/dL — ABNORMAL HIGH (ref 65–99)
Potassium: 3.3 mmol/L — ABNORMAL LOW (ref 3.5–5.1)
Sodium: 131 mmol/L — ABNORMAL LOW (ref 135–145)
Total Bilirubin: 1 mg/dL (ref 0.3–1.2)
Total Protein: 7.6 g/dL (ref 6.5–8.1)

## 2016-04-07 LAB — CBC WITH DIFFERENTIAL/PLATELET
Basophils Absolute: 0.1 10*3/uL (ref 0.0–0.1)
Basophils Relative: 2 %
Eosinophils Absolute: 0.1 10*3/uL (ref 0.0–0.7)
Eosinophils Relative: 4 %
HCT: 28.5 % — ABNORMAL LOW (ref 39.0–52.0)
Hemoglobin: 9.1 g/dL — ABNORMAL LOW (ref 13.0–17.0)
Lymphocytes Relative: 25 %
Lymphs Abs: 0.7 10*3/uL (ref 0.7–4.0)
MCH: 29.4 pg (ref 26.0–34.0)
MCHC: 31.9 g/dL (ref 30.0–36.0)
MCV: 91.9 fL (ref 78.0–100.0)
Monocytes Absolute: 0.3 10*3/uL (ref 0.1–1.0)
Monocytes Relative: 12 %
Neutro Abs: 1.6 10*3/uL — ABNORMAL LOW (ref 1.7–7.7)
Neutrophils Relative %: 57 %
Platelets: 181 10*3/uL (ref 150–400)
RBC: 3.1 MIL/uL — ABNORMAL LOW (ref 4.22–5.81)
RDW: 21 % — ABNORMAL HIGH (ref 11.5–15.5)
WBC: 2.8 10*3/uL — ABNORMAL LOW (ref 4.0–10.5)

## 2016-04-07 LAB — AMMONIA: Ammonia: 58 umol/L — ABNORMAL HIGH (ref 9–35)

## 2016-04-07 LAB — PHOSPHORUS: Phosphorus: 4.7 mg/dL — ABNORMAL HIGH (ref 2.5–4.6)

## 2016-04-07 LAB — MAGNESIUM: Magnesium: 1.7 mg/dL (ref 1.7–2.4)

## 2016-04-08 LAB — POTASSIUM: Potassium: 3.7 mmol/L (ref 3.5–5.1)

## 2016-04-09 LAB — BASIC METABOLIC PANEL
Anion gap: 14 (ref 5–15)
BUN: 16 mg/dL (ref 6–20)
CO2: 25 mmol/L (ref 22–32)
Calcium: 9.2 mg/dL (ref 8.9–10.3)
Chloride: 91 mmol/L — ABNORMAL LOW (ref 101–111)
Creatinine, Ser: 0.98 mg/dL (ref 0.61–1.24)
GFR calc Af Amer: >60 mL/min (ref >60)
GFR calc non Af Amer: >60 mL/min (ref >60)
Glucose, Bld: 150 mg/dL — ABNORMAL HIGH (ref 65–99)
Potassium: 3.9 mmol/L (ref 3.5–5.1)
Sodium: 130 mmol/L — ABNORMAL LOW (ref 135–145)

## 2016-04-09 LAB — PHOSPHORUS: Phosphorus: 4 mg/dL (ref 2.5–4.6)

## 2016-04-09 LAB — MAGNESIUM: Magnesium: 1.6 mg/dL — ABNORMAL LOW (ref 1.7–2.4)

## 2016-04-10 LAB — AMMONIA: Ammonia: 117 umol/L — ABNORMAL HIGH (ref 9–35)

## 2016-04-10 LAB — BASIC METABOLIC PANEL
Anion gap: 10 (ref 5–15)
BUN: 12 mg/dL (ref 6–20)
CO2: 29 mmol/L (ref 22–32)
Calcium: 9.3 mg/dL (ref 8.9–10.3)
Chloride: 91 mmol/L — ABNORMAL LOW (ref 101–111)
Creatinine, Ser: 0.84 mg/dL (ref 0.61–1.24)
GFR calc Af Amer: >60 mL/min (ref >60)
GFR calc non Af Amer: >60 mL/min (ref >60)
Glucose, Bld: 159 mg/dL — ABNORMAL HIGH (ref 65–99)
Potassium: 3.5 mmol/L (ref 3.5–5.1)
Sodium: 130 mmol/L — ABNORMAL LOW (ref 135–145)

## 2017-03-08 ENCOUNTER — Encounter: Payer: Self-pay | Admitting: Internal Medicine

## 2017-05-27 ENCOUNTER — Encounter: Payer: Self-pay | Admitting: "Endocrinology

## 2017-06-06 ENCOUNTER — Encounter: Payer: Self-pay | Admitting: "Endocrinology

## 2017-08-02 ENCOUNTER — Encounter (INDEPENDENT_AMBULATORY_CARE_PROVIDER_SITE_OTHER): Payer: Medicare Other | Admitting: Ophthalmology

## 2017-08-02 DIAGNOSIS — H35033 Hypertensive retinopathy, bilateral: Secondary | ICD-10-CM

## 2017-08-02 DIAGNOSIS — I10 Essential (primary) hypertension: Secondary | ICD-10-CM

## 2017-08-02 DIAGNOSIS — H2513 Age-related nuclear cataract, bilateral: Secondary | ICD-10-CM

## 2017-08-02 DIAGNOSIS — H35342 Macular cyst, hole, or pseudohole, left eye: Secondary | ICD-10-CM | POA: Diagnosis not present

## 2017-08-02 DIAGNOSIS — E113293 Type 2 diabetes mellitus with mild nonproliferative diabetic retinopathy without macular edema, bilateral: Secondary | ICD-10-CM

## 2017-08-02 DIAGNOSIS — E11319 Type 2 diabetes mellitus with unspecified diabetic retinopathy without macular edema: Secondary | ICD-10-CM

## 2017-08-02 DIAGNOSIS — H43813 Vitreous degeneration, bilateral: Secondary | ICD-10-CM | POA: Diagnosis not present

## 2017-10-07 ENCOUNTER — Encounter: Payer: Self-pay | Admitting: Internal Medicine

## 2017-10-20 ENCOUNTER — Encounter: Payer: Self-pay | Admitting: "Endocrinology

## 2017-10-20 ENCOUNTER — Ambulatory Visit (INDEPENDENT_AMBULATORY_CARE_PROVIDER_SITE_OTHER): Payer: Medicare Other | Admitting: "Endocrinology

## 2017-10-20 VITALS — BP 99/62 | HR 75 | Ht 65.75 in | Wt 246.0 lb

## 2017-10-20 DIAGNOSIS — E782 Mixed hyperlipidemia: Secondary | ICD-10-CM

## 2017-10-20 DIAGNOSIS — E1122 Type 2 diabetes mellitus with diabetic chronic kidney disease: Secondary | ICD-10-CM

## 2017-10-20 DIAGNOSIS — Z794 Long term (current) use of insulin: Secondary | ICD-10-CM | POA: Diagnosis not present

## 2017-10-20 DIAGNOSIS — N183 Chronic kidney disease, stage 3 unspecified: Secondary | ICD-10-CM

## 2017-10-20 DIAGNOSIS — I10 Essential (primary) hypertension: Secondary | ICD-10-CM | POA: Diagnosis not present

## 2017-10-20 NOTE — Patient Instructions (Signed)

## 2017-10-20 NOTE — Progress Notes (Signed)
Endocrinology Consult Note       10/20/2017, 1:40 PM   Subjective:    Patient ID: Jeffrey [name], male    DOB: [birth year].  Jeffrey [name] is being seen in consultation for management of currently uncontrolled symptomatic diabetes requested by  Jeffrey Blitz, MD.   Past Medical History:  Diagnosis Date  . Alcoholic cirrhosis ([place])    patient reports completing hep a and b vaccines in 2006  . Anemia    has had 3 units of prbcs 2013  . Anxiety   . Asymptomatic gallstones    Ultrasound in 2006  . Depression   . Diabetes ([place])   . Duodenal ulcer with hemorrhage    per patient in 2006 or 2007, records have been requested  . 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 History of alcohol abuse    quit in 2006  . Hypertension   . Splenomegaly    Ultrasound in 2006  . Tubular adenoma of colon    Past Surgical History:  Procedure Laterality Date  . APPENDECTOMY    . COLONOSCOPY  03/10/2005   Rectal polyp as described above, removed with snare. Left sided  diverticula. The remainder of the colonic mucosa appeared normal. Inflammed polyp on path.  . COLONOSCOPY  04/1999   Dr. Thea Silversmith polyps removed,  Path showed hyperplastic  . COLONOSCOPY WITH ESOPHAGOGASTRODUODENOSCOPY (EGD)  03/31/2012   PYP:[id number] AVMs. Colonic diverticulosis. tubular adenoma colon  . ESOPHAGOGASTRODUODENOSCOPY  01/15/2005   Three columns grade 1 to 2 esophageal varices, otherwise normal esophageal mucosa.  Esophagus was not manipulated otherwise./Nodularity of the antrum with overlying erosions, nonspecific finding. Path showed rare H.pylori  . ESOPHAGOGASTRODUODENOSCOPY  09/2008   Dr. Gaylene Brooks columns of grade 2-3 esoph varices, only one column was prominent. Portal gastropathy, multiple gastrc polyps at antrum, two were 2cm with black eschar, bulbar polyps, bulbar erosions  . ESOPHAGOGASTRODUODENOSCOPY  11/2004   Dr.  Brantley Stage 3 esoph varices  . ESOPHAGOGASTRODUODENOSCOPY  03/31/2012   RMR: 4 columns(3-GR 2, 1-GR1) non-bleeding esophageal varices, portal gastropathy, small HH, early GAVE, multiple gastric polyps   . GASTRIC VARICES BANDING  03/31/2012   Procedure: GASTRIC VARICES BANDING;  Surgeon: Daneil Dolin, MD;  Location: [facility];  Service: Endoscopy;  Laterality: N/A;   Social History   Socioeconomic History  . Marital status: Divorced    Spouse name: Not on file  . Number of children: 2  . Years of education: Not on file  . Highest education level: Not on file  Occupational History  . Occupation: Disabled    Employer: SELF EMPLOYED  Social Needs  . Financial resource strain: Not on file  . Food insecurity:    Worry: Not on file    Inability: Not on file  . Transportation needs:    Medical: Not on file    Non-medical: Not on file  Tobacco Use  . Smoking status: Former Smoker    Packs/day: 0.50    Types: Cigarettes  . Smokeless tobacco: Never Used  . Tobacco comment: qiut about 25+ years  Substance and Sexual Activity  . Alcohol use:  No    Comment: quit in 2006  . Drug use: No  . Sexual activity: Not Currently  Lifestyle  . Physical activity:    Days per week: Not on file    Minutes per session: Not on file  . Stress: Not on file  Relationships  . Social connections:    Talks on phone: Not on file    Gets together: Not on file    Attends religious service: Not on file    Active member of club or organization: Not on file    Attends meetings of clubs or organizations: Not on file    Relationship status: Not on file  Other Topics Concern  . Not on file  Social History Narrative   Two step children from second marriage (divorced) who live with him along with some grandchildren.    Outpatient Encounter Medications as of 10/20/2017  Medication Sig  . Ascorbic Acid (VITAMIN C) 1000 MG tablet Take 1,000 mg by mouth 2 (two) times daily.  [name] gabapentin  (NEURONTIN) 100 MG capsule Take 100 mg by mouth 3 (three) times daily.  . Garlic 1610 MG CAPS Take 1 capsule by mouth daily.   . insulin degludec (TRESIBA FLEXTOUCH) 100 UNIT/ML SOPN FlexTouch Pen Inject 80 Units into the skin at bedtime.  . insulin lispro (HUMALOG KWIKPEN) 100 UNIT/ML KiwkPen Inject 10-16 Units into the skin 3 (three) times daily before meals.  . lactulose (CONSTULOSE) 10 GM/15ML solution Take 40 g by mouth 3 (three) times daily.  . milk thistle 175 MG tablet Take 175 mg by mouth daily.  . mirtazapine (REMERON) 15 MG tablet Take 15 mg by mouth at bedtime.  . Multiple Vitamin (MULTIVITAMIN) capsule Take 1 capsule by mouth daily.  . Omega 3 1000 MG CAPS Take by mouth 2 (two) times daily.  [name] omeprazole (PRILOSEC) 20 MG capsule TAKE 1 CAPSULE BY MOUTH EVERY DAY (Patient taking differently: TAKE 1 CAPSULE BY MOUTH EVERY DAY 40 mg)  . pravastatin (PRAVACHOL) 20 MG tablet Take 20 mg by mouth daily.  . propranolol (INDERAL) 40 MG tablet TAKE 1 TABLET BY MOUTH TWICE DAILY  . sitaGLIPtin (JANUVIA) 100 MG tablet Take 50 mg by mouth daily.  [name] spironolactone (ALDACTONE) 100 MG tablet Take 25 mg by mouth daily.   . tamsulosin (FLOMAX) 0.4 MG CAPS capsule Take 0.4 mg by mouth.  [name] tiZANidine (ZANAFLEX) 2 MG tablet Take by mouth every 6 (six) hours as needed for muscle spasms.  [name] venlafaxine (EFFEXOR) 75 MG tablet Take 75 mg by mouth 2 (two) times daily.   . vitamin E 400 UNIT capsule Take 400 Units by mouth daily.  . [DISCONTINUED] insulin NPH Human (HUMULIN N,NOVOLIN N) 100 UNIT/ML injection Inject 25 Units into the skin at bedtime.  . [DISCONTINUED] metFORMIN (GLUCOPHAGE) 1000 MG tablet Take 1,000 mg by mouth 2 (two) times daily with a meal.  . [DISCONTINUED] ALPHA LIPOIC ACID PO Take 1 tablet by mouth daily.   . [DISCONTINUED] Ascorbic Acid (VITAMIN C) 1000 MG tablet Take 1,000 mg by mouth daily.  . [DISCONTINUED] Cholecalciferol (VITAMIN D-3 PO) Take 1 tablet by mouth daily.   .  [DISCONTINUED] Chromium Picolinate (CHROMIUM PICOLATE) 1000 MCG TABS Take 1 tablet by mouth daily.   . [DISCONTINUED] Cinnamon 500 MG capsule Take 500 mg by mouth daily.  . [DISCONTINUED] ciprofloxacin (CIPRO) 500 MG tablet Take 1 tablet (500 mg total) by mouth 2 (two) times daily.  . [DISCONTINUED] Cyanocobalamin (VITAMIN B 12 PO) Take 1 tablet  by mouth daily.   . [DISCONTINUED] cyclobenzaprine (FLEXERIL) 10 MG tablet Take 10 mg by mouth daily.   . [DISCONTINUED] Flaxseed, Linseed, (FLAX SEED OIL) 1300 MG CAPS Take 1 capsule by mouth daily.   . [DISCONTINUED] FLUoxetine (PROZAC) 20 MG capsule Take 20 mg by mouth daily.   . [DISCONTINUED] furosemide (LASIX) 40 MG tablet Take 40 mg by mouth daily.   . [DISCONTINUED] HYDROcodone-acetaminophen (NORCO) 10-325 MG tablet Take 1 tablet by mouth every 6 (six) hours as needed.  . [DISCONTINUED] Insulin Detemir (LEVEMIR FLEXPEN) 100 UNIT/ML Pen Inject 50 Units into the skin at bedtime.  . [DISCONTINUED] iron polysaccharides (NIFEREX) 150 MG capsule Take 150 mg by mouth daily.  . [DISCONTINUED] lisinopril (PRINIVIL,ZESTRIL) 10 MG tablet Take 10 mg by mouth daily.  . [DISCONTINUED] Lutein 10 MG TABS Take 1 tablet by mouth daily.   . [DISCONTINUED] NOVOLOG FLEXPEN 100 UNIT/ML injection Inject 30 Units into the skin every evening.   . [DISCONTINUED] ranitidine (ZANTAC) 150 MG tablet Take 150 mg by mouth 2 (two) times daily.   . [DISCONTINUED] saccharomyces boulardii (FLORASTOR) 250 MG capsule Take 1 capsule (250 mg total) by mouth 2 (two) times daily.  . [DISCONTINUED] tamsulosin (FLOMAX) 0.4 MG CAPS capsule Take 0.4 mg by mouth.  . [DISCONTINUED] Thiamine HCl (VITAMIN B-1) 250 MG tablet Take 250 mg by mouth daily.  . [DISCONTINUED] triamcinolone cream (KENALOG) 0.1 % Apply 1 application topically 2 (two) times daily.   No facility-administered encounter medications on file as of 10/20/2017.     ALLERGIES: No Known Allergies  VACCINATION STATUS:  There  is no immunization history on file for this patient.  Diabetes  He presents for his initial diabetic visit. He has type 2 diabetes mellitus. Onset time: He was diagnosed at approximate age of 17 years. His disease course has been worsening. Pertinent negatives for hypoglycemia include no confusion, headaches, pallor or seizures. Associated symptoms include polydipsia and polyuria. Pertinent negatives for diabetes include no chest pain, no fatigue, no polyphagia and no weakness. Risk factors for coronary artery disease include male sex, obesity, hypertension, dyslipidemia, family history and diabetes mellitus. Current diabetic treatments: He is taking Humulin 25 units nightly, Tresiba 100 units every morning, Humalog 30 units with breakfast, 30 units with lunch, 50 units with dinner.  He is also on metformin 1000 mg p.o. BID, Januvia 100 mg once a day. His weight is increasing steadily. He is following a generally unhealthy diet. When asked about meal planning, he reported none. He has had a previous visit with a dietitian (Patient is working with a Microbiologist in [place].). His overall blood glucose range is >200 mg/dl. (Patient brought a log showing random blood glucose monitoring 1 time a day average greater than 200 mg/dL.) An ACE inhibitor/angiotensin II receptor blocker is being taken. Eye exam is current.  Hypertension  This is a chronic problem. The current episode started more than 1 year ago. The problem is uncontrolled. Pertinent negatives include no chest pain, headaches, neck pain, palpitations or shortness of breath. Risk factors for coronary artery disease include diabetes mellitus, dyslipidemia, obesity, male gender, sedentary lifestyle and smoking/tobacco exposure. Hypertensive end-organ damage includes kidney disease.    Review of Systems  Constitutional: Negative for chills, fatigue, fever and unexpected weight change.  HENT: Negative for dental problem, mouth sores and trouble  swallowing.   Eyes: Negative for visual disturbance.  Respiratory: Negative for cough, choking, chest tightness, shortness of breath and wheezing.   Cardiovascular: Negative for  chest pain, palpitations and leg swelling.  Gastrointestinal: Negative for abdominal distention, abdominal pain, constipation, diarrhea, nausea and vomiting.  Endocrine: Positive for polydipsia and polyuria. Negative for polyphagia.  Genitourinary: Negative for dysuria, flank pain, hematuria and urgency.  Musculoskeletal: Negative for back pain, gait problem, myalgias and neck pain.  Skin: Negative for pallor, rash and wound.  Neurological: Negative for seizures, syncope, weakness, numbness and headaches.  Psychiatric/Behavioral: Negative.  Negative for confusion and dysphoric mood.    Objective:    BP 99/62   Pulse 75   Ht 5' 5.75" (1.67 m)   Wt 246 lb (111.6 kg)   BMI 40.01 kg/m   Wt Readings from Last 3 Encounters:  10/20/17 246 lb (111.6 kg)  01/10/16 223 lb 3.2 oz (101.2 kg)  04/24/15 239 lb (108.4 kg)     Physical Exam  Constitutional: He is oriented to person, place, and time. He appears well-developed. He is cooperative. No distress.  HENT:  Head: Normocephalic and atraumatic.  Eyes: EOM are normal.  Neck: Normal range of motion. Neck supple. No tracheal deviation present. No thyromegaly present.  Cardiovascular: Normal rate, S1 normal, S2 normal and normal heart sounds. Exam reveals no gallop.  No murmur heard. Pulses:      Dorsalis pedis pulses are 1+ on the right side, and 1+ on the left side.       Posterior tibial pulses are 1+ on the right side, and 1+ on the left side.  Pulmonary/Chest: Breath sounds normal. No respiratory distress. He has no wheezes.  Abdominal: Soft. Bowel sounds are normal. He exhibits no distension. There is no tenderness. There is no guarding and no CVA tenderness.  Musculoskeletal: He exhibits no edema.       Right shoulder: He exhibits no swelling and no  deformity.  Neurological: He is alert and oriented to person, place, and time. He has normal strength and normal reflexes. No cranial nerve deficit or sensory deficit. Gait normal.  Skin: Skin is warm and dry. No rash noted. No cyanosis. Nails show no clubbing.  Psychiatric: He has a normal mood and affect. His speech is normal. Judgment normal. Cognition and memory are normal.      CMP ( most recent) CMP     Component Value Date/Time   NA 130 (L) 04/10/2016 0606   K 3.5 04/10/2016 0606   CL 91 (L) 04/10/2016 0606   CO2 29 04/10/2016 0606   GLUCOSE 159 (H) 04/10/2016 0606   BUN 12 04/10/2016 0606   CREATININE 0.84 04/10/2016 0606   CREATININE 0.91 04/24/2015 1113   CALCIUM 9.3 04/10/2016 0606   PROT 7.6 04/07/2016 0719   ALBUMIN 3.1 (L) 04/07/2016 0719   AST 43 (H) 04/07/2016 0719   ALT 35 04/07/2016 0719   ALKPHOS 324 (H) 04/07/2016 0719   BILITOT 1.0 04/07/2016 0719   GFRNONAA >60 04/10/2016 0606   GFRAA >60 04/10/2016 0606     Diabetic Labs (most recent): Lab Results  Component Value Date   HGBA1C 5.3 01/10/2016   HGBA1C 7.5 (A) 02/25/2012   Lipid Panel     Component Value Date/Time   CHOL 112 02/25/2012 1034   TRIG 110 02/25/2012 1034    Assessment & Plan:   1. Type 2 diabetes mellitus with stage 3 chronic kidney disease, with long-term current use of insulin (HCC)  - Jeffrey [name] has currently uncontrolled symptomatic type 2 DM since 72 years of age,  with most recent A1c of 7.1 %. Recent labs reviewed.  -  his diabetes is complicated by insufficiency, obesity/sedentary life, history of smoking and Jeffrey [name] remains at a high risk for more acute and chronic complications which include CAD, CVA, CKD, retinopathy, and neuropathy. These are all discussed in detail with the patient.  - I have counseled him on diet management and weight loss, by adopting a carbohydrate restricted/protein rich diet.  - Suggestion is made for him to avoid simple carbohydrates   from his diet including Cakes, Sweet Desserts, Ice Cream, Soda (diet and regular), Sweet Tea, Candies, Chips, Cookies, Store Bought Juices, Alcohol in Excess of  1-2 drinks a day, Artificial Sweeteners, and "Sugar-free" Products. This will help patient to have stable blood glucose profile and potentially avoid unintended weight gain.  - I encouraged him to switch to  unprocessed or minimally processed complex starch and increased protein intake (animal or plant source), fruits, and vegetables.  - he is advised to stick to a routine mealtimes to eat 3 meals  a day and avoid unnecessary snacks ( to snack only to correct hypoglycemia).   - he will be scheduled with Jearld Fenton, RDN, CDE for individualized diabetes education.  - I have approached him with the following individualized plan to manage diabetes and patient agrees:   -He is on a particularly complicated insulin program.  He will benefit from simplified treatment regimen.   -I approached him to lower his Tresiba to 80 units and switch it to bedtime, lower his Humalog to 10 units  3 times a day with meals  for pre-meal BG readings of 90-150mg /dl, plus patient specific correction dose for unexpected hyperglycemia above 150mg /dl, associated with strict monitoring of glucose 4 times a day-before meals and at bedtime. - Patient is warned not to take insulin without proper monitoring per orders. -Adjustment parameters are given for hypo and hyperglycemia in writing. -Patient is encouraged to call clinic for blood glucose levels less than 70 or above 300 mg /dl. - I advised him to lower Januvia to 50 mg p.o. every morning with breakfast, therapeutically suitable for patient . - I will discontinue metformin, risk outweighs benefit for this patient. -I have advised him to discontinue Humulin due to duplicate basal insulin. -Patient is not a candidate for SGLT2 inhibitors due to CKD. -He will be sent for new set of labs today, will be considered  for lower dose of metformin if renal function is better than stage 3.  - Patient specific target  A1c;  LDL, HDL, Triglycerides, and  Waist Circumference were discussed in detail.  2) BP/HTN: His blood pressure is controlled. Continue current medications . 3) Lipids/HPL:   He is lipid panel from December 2018 showed LDL at 80.   Patient is advised to continue 20 mg p.o. nightly.   4)  Weight/Diet: CDE Consult will be initiated , exercise, and detailed carbohydrates information provided.  5) Chronic Care/Health Maintenance:  -he  is on  Statin medications and  is encouraged to continue to follow up with Ophthalmology, Dentist,  Podiatrist at least yearly or according to recommendations, and advised to  stay away from smoking. I have recommended yearly flu vaccine and pneumonia vaccination at least every 5 years; moderate intensity exercise for up to 150 minutes weekly; and  sleep for at least 7 hours a day.  - I advised patient to maintain close follow up with Jeffrey Blitz, MD for primary care needs.  - Time spent with the patient: 45 minutes, of which >50% was spent in obtaining information about  his symptoms, reviewing his previous labs, evaluations, and treatments, counseling him about his currently uncontrolled and complicated type 2 diabetes, hypertension, hyperlipidemia, and developing a plan to confirm the diagnosis and long term treatment as necessary.  Pollie Friar participated in the discussions, expressed understanding, and voiced agreement with the above plans.  All questions were answered to his satisfaction. he is encouraged to contact clinic should he have any questions or concerns prior to his return visit.  Follow up plan: - Return in about 1 week (around 10/27/2017) for meter, and logs, labs today.  Glade Lloyd, MD [facility] [address] [place], [place] [address] Phone: [phone]  Fax: [phone]    10/20/2017,  1:40 PM  This note was partially dictated with voice recognition software. Similar sounding words can be transcribed inadequately or may not  be corrected upon review.

## 2017-10-21 LAB — COMPLETE METABOLIC PANEL WITH GFR
AG Ratio: 1.3 (calc) (ref 1.0–2.5)
ALT: 23 U/L (ref 9–46)
AST: 27 U/L (ref 10–35)
Albumin: 3.9 g/dL (ref 3.6–5.1)
Alkaline phosphatase (APISO): 78 U/L (ref 40–115)
BUN/Creatinine Ratio: 17 (calc) (ref 6–22)
BUN: 23 mg/dL (ref 7–25)
CO2: 31 mmol/L (ref 20–32)
Calcium: 9.3 mg/dL (ref 8.6–10.3)
Chloride: 98 mmol/L (ref 98–110)
Creat: 1.38 mg/dL — ABNORMAL HIGH (ref 0.70–1.18)
GFR, Est African American: 60 mL/min/{1.73_m2} (ref >=60)
GFR, Est Non African American: 51 mL/min/{1.73_m2} — ABNORMAL LOW (ref >=60)
Globulin: 3 g/dL (calc) (ref 1.9–3.7)
Glucose, Bld: 105 mg/dL (ref 65–139)
Potassium: 4.4 mmol/L (ref 3.5–5.3)
Sodium: 136 mmol/L (ref 135–146)
Total Bilirubin: 0.8 mg/dL (ref 0.2–1.2)
Total Protein: 6.9 g/dL (ref 6.1–8.1)

## 2017-10-21 LAB — T4, FREE: Free T4: 0.9 ng/dL (ref 0.8–1.8)

## 2017-10-21 LAB — HEMOGLOBIN A1C
Hgb A1c MFr Bld: 9.1 % of total Hgb — ABNORMAL HIGH (ref <5.7)
Mean Plasma Glucose: 214 (calc)
eAG (mmol/L): 11.9 (calc)

## 2017-10-21 LAB — TSH: TSH: 1.9 mIU/L (ref 0.40–4.50)

## 2017-10-21 LAB — MICROALBUMIN / CREATININE URINE RATIO
Creatinine, Urine: 18 mg/dL — ABNORMAL LOW (ref 20–320)
Microalb, Ur: <0.2 mg/dL

## 2017-10-27 ENCOUNTER — Telehealth: Payer: Self-pay

## 2017-10-27 NOTE — Telephone Encounter (Signed)
Pt notified and agrees. 

## 2017-10-27 NOTE — Telephone Encounter (Signed)
Advise to increase Tresiba to 100 units , increase Humalog to 15-21 [facility], keep appointment.

## 2017-10-27 NOTE — Telephone Encounter (Signed)
Pt states he has had high BG readings.   Date Before breakfast Before lunch Before supper Bedtime  5/27 411  [phone]  5/28 417 [id number] HI 546  5/29 549             Pt taking:Tresiba 80 units qhs, Januvia 50mg  qd, Humalog 10-16 TIDAC

## 2017-11-02 ENCOUNTER — Ambulatory Visit (INDEPENDENT_AMBULATORY_CARE_PROVIDER_SITE_OTHER): Payer: Medicare Other | Admitting: "Endocrinology

## 2017-11-02 ENCOUNTER — Encounter: Payer: Self-pay | Admitting: "Endocrinology

## 2017-11-02 VITALS — BP 117/65 | HR 76 | Ht 65.75 in | Wt 239.0 lb

## 2017-11-02 DIAGNOSIS — Z794 Long term (current) use of insulin: Secondary | ICD-10-CM | POA: Diagnosis not present

## 2017-11-02 DIAGNOSIS — E1122 Type 2 diabetes mellitus with diabetic chronic kidney disease: Secondary | ICD-10-CM | POA: Diagnosis not present

## 2017-11-02 DIAGNOSIS — N183 Chronic kidney disease, stage 3 unspecified: Secondary | ICD-10-CM

## 2017-11-02 DIAGNOSIS — I10 Essential (primary) hypertension: Secondary | ICD-10-CM

## 2017-11-02 DIAGNOSIS — E782 Mixed hyperlipidemia: Secondary | ICD-10-CM

## 2017-11-02 MED ORDER — METFORMIN HCL 500 MG PO TABS: 500.0000 mg | ORAL_TABLET | Freq: Two times a day (BID) | ORAL | 2 refills | Status: DC

## 2017-11-02 NOTE — Patient Instructions (Signed)

## 2017-11-02 NOTE — Progress Notes (Signed)
Endocrinology Consult Note       11/02/2017, 1:36 PM   Subjective:    Patient ID: Jeffrey [name], male    DOB: 28-Nov-1945.  Jeffrey [name] is being seen in consultation for management of currently uncontrolled symptomatic diabetes requested by  Jeffrey Blitz, MD.   Past Medical History:  Diagnosis Date  . Alcoholic cirrhosis ([place])    patient reports completing hep a and b vaccines in 2006  . Anemia    has had 3 units of prbcs 2013  . Anxiety   . Asymptomatic gallstones    Ultrasound in 2006  . Depression   . Diabetes ([place])   . Duodenal ulcer with hemorrhage    per patient in 2006 or 2007, records have been requested  . 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 History of alcohol abuse    quit in 2006  . Hypertension   . Splenomegaly    Ultrasound in 2006  . Tubular adenoma of colon    Past Surgical History:  Procedure Laterality Date  . APPENDECTOMY    . COLONOSCOPY  03/10/2005   Rectal polyp as described above, removed with snare. Left sided  diverticula. The remainder of the colonic mucosa appeared normal. Inflammed polyp on path.  . COLONOSCOPY  04/1999   Dr. Thea Silversmith polyps removed,  Path showed hyperplastic  . COLONOSCOPY WITH ESOPHAGOGASTRODUODENOSCOPY (EGD)  03/31/2012   BTD:HRCBULA AVMs. Colonic diverticulosis. tubular adenoma colon  . ESOPHAGOGASTRODUODENOSCOPY  01/15/2005   Three columns grade 1 to 2 esophageal varices, otherwise normal esophageal mucosa.  Esophagus was not manipulated otherwise./Nodularity of the antrum with overlying erosions, nonspecific finding. Path showed rare H.pylori  . ESOPHAGOGASTRODUODENOSCOPY  09/2008   Dr. Gaylene Brooks columns of grade 2-3 esoph varices, only one column was prominent. Portal gastropathy, multiple gastrc polyps at antrum, two were 2cm with black eschar, bulbar polyps, bulbar erosions  . ESOPHAGOGASTRODUODENOSCOPY  11/2004   Dr.  Brantley Stage 3 esoph varices  . ESOPHAGOGASTRODUODENOSCOPY  03/31/2012   RMR: 4 columns(3-GR 2, 1-GR1) non-bleeding esophageal varices, portal gastropathy, small HH, early GAVE, multiple gastric polyps   . GASTRIC VARICES BANDING  03/31/2012   Procedure: GASTRIC VARICES BANDING;  Surgeon: Daneil Dolin, MD;  Location: [facility];  Service: Endoscopy;  Laterality: N/A;   Social History   Socioeconomic History  . Marital status: Divorced    Spouse name: Not on file  . Number of children: 2  . Years of education: Not on file  . Highest education level: Not on file  Occupational History  . Occupation: Disabled    Employer: SELF EMPLOYED  Social Needs  . Financial resource strain: Not on file  . Food insecurity:    Worry: Not on file    Inability: Not on file  . Transportation needs:    Medical: Not on file    Non-medical: Not on file  Tobacco Use  . Smoking status: Former Smoker    Packs/day: 0.50    Types: Cigarettes  . Smokeless tobacco: Never Used  . Tobacco comment: qiut about 25+ years  Substance and Sexual Activity  . Alcohol use:  No    Comment: quit in 2006  . Drug use: No  . Sexual activity: Not Currently  Lifestyle  . Physical activity:    Days per week: Not on file    Minutes per session: Not on file  . Stress: Not on file  Relationships  . Social connections:    Talks on phone: Not on file    Gets together: Not on file    Attends religious service: Not on file    Active member of club or organization: Not on file    Attends meetings of clubs or organizations: Not on file    Relationship status: Not on file  Other Topics Concern  . Not on file  Social History Narrative   Two step children from second marriage (divorced) who live with him along with some grandchildren.    Outpatient Encounter Medications as of 11/02/2017  Medication Sig  . Ascorbic Acid (VITAMIN C) 1000 MG tablet Take 1,000 mg by mouth 2 (two) times daily.  [name] gabapentin (NEURONTIN)  100 MG capsule Take 100 mg by mouth 3 (three) times daily.  . Garlic 5427 MG CAPS Take 1 capsule by mouth daily.   . insulin degludec (TRESIBA FLEXTOUCH) 100 UNIT/ML SOPN FlexTouch Pen Inject 100 Units into the skin at bedtime.  . insulin lispro (HUMALOG KWIKPEN) 100 UNIT/ML KiwkPen Inject 20-26 Units into the skin 3 (three) times daily before meals.  . lactulose (CONSTULOSE) 10 GM/15ML solution Take 40 g by mouth 3 (three) times daily.  . metFORMIN (GLUCOPHAGE) 500 MG tablet Take 1 tablet (500 mg total) by mouth 2 (two) times daily with a meal.  . milk thistle 175 MG tablet Take 175 mg by mouth daily.  . mirtazapine (REMERON) 15 MG tablet Take 15 mg by mouth at bedtime.  . Multiple Vitamin (MULTIVITAMIN) capsule Take 1 capsule by mouth daily.  . Omega 3 1000 MG CAPS Take by mouth 2 (two) times daily.  [name] omeprazole (PRILOSEC) 20 MG capsule TAKE 1 CAPSULE BY MOUTH EVERY DAY (Patient taking differently: TAKE 1 CAPSULE BY MOUTH EVERY DAY 40 mg)  . pravastatin (PRAVACHOL) 20 MG tablet Take 20 mg by mouth daily.  . propranolol (INDERAL) 40 MG tablet TAKE 1 TABLET BY MOUTH TWICE DAILY  . sitaGLIPtin (JANUVIA) 100 MG tablet Take 50 mg by mouth daily.  [name] spironolactone (ALDACTONE) 100 MG tablet Take 25 mg by mouth daily.   . tamsulosin (FLOMAX) 0.4 MG CAPS capsule Take 0.4 mg by mouth.  [name] tiZANidine (ZANAFLEX) 2 MG tablet Take by mouth every 6 (six) hours as needed for muscle spasms.  [name] venlafaxine (EFFEXOR) 75 MG tablet Take 75 mg by mouth 2 (two) times daily.   . vitamin E 400 UNIT capsule Take 400 Units by mouth daily.   No facility-administered encounter medications on file as of 11/02/2017.     ALLERGIES: No Known Allergies  VACCINATION STATUS:  There is no immunization history on file for this patient.  Diabetes  He presents for his follow-up diabetic visit. He has type 2 diabetes mellitus. Onset time: He was diagnosed at approximate age of 61 years. His disease course has been worsening.  Pertinent negatives for hypoglycemia include no confusion, headaches, pallor or seizures. Associated symptoms include polydipsia and polyuria. Pertinent negatives for diabetes include no chest pain, no fatigue, no polyphagia and no weakness. Risk factors for coronary artery disease include male sex, obesity, hypertension, dyslipidemia, family history and diabetes mellitus. Current diabetic treatments: He is taking Humulin 25  units nightly, Tresiba 100 units every morning, Humalog 30 units with breakfast, 30 units with lunch, 50 units with dinner.  He is also on metformin 1000 mg p.o. BID, Januvia 100 mg once a day. He is compliant with treatment most of the time. His weight is increasing steadily. He is following a generally unhealthy diet. When asked about meal planning, he reported none. He has had a previous visit with a dietitian (Patient is working with a Microbiologist in [place].). His breakfast blood glucose range is generally >200 mg/dl. His lunch blood glucose range is generally >200 mg/dl. His dinner blood glucose range is generally >200 mg/dl. His bedtime blood glucose range is generally >200 mg/dl. His overall blood glucose range is >200 mg/dl. An ACE inhibitor/angiotensin II receptor blocker is being taken. Eye exam is current.  Hypertension  This is a chronic problem. The current episode started more than 1 year ago. The problem is uncontrolled. Pertinent negatives include no chest pain, headaches, neck pain, palpitations or shortness of breath. Risk factors for coronary artery disease include diabetes mellitus, dyslipidemia, obesity, male gender, sedentary lifestyle and smoking/tobacco exposure. Hypertensive end-organ damage includes kidney disease.    Review of Systems  Constitutional: Negative for chills, fatigue, fever and unexpected weight change.  HENT: Negative for dental problem, mouth sores and trouble swallowing.   Eyes: Negative for visual disturbance.  Respiratory: Negative  for cough, choking, chest tightness, shortness of breath and wheezing.   Cardiovascular: Negative for chest pain, palpitations and leg swelling.  Gastrointestinal: Negative for abdominal distention, abdominal pain, constipation, diarrhea, nausea and vomiting.  Endocrine: Positive for polydipsia and polyuria. Negative for polyphagia.  Genitourinary: Negative for dysuria, flank pain, hematuria and urgency.  Musculoskeletal: Negative for back pain, gait problem, myalgias and neck pain.  Skin: Negative for pallor, rash and wound.  Neurological: Negative for seizures, syncope, weakness, numbness and headaches.  Psychiatric/Behavioral: Negative.  Negative for confusion and dysphoric mood.    Objective:    BP 117/65   Pulse 76   Ht 5' 5.75" (1.67 m)   Wt 239 lb (108.4 kg)   BMI 38.87 kg/m   Wt Readings from Last 3 Encounters:  11/02/17 239 lb (108.4 kg)  10/20/17 246 lb (111.6 kg)  01/10/16 223 lb 3.2 oz (101.2 kg)     Physical Exam  Constitutional: He is oriented to person, place, and time. He appears well-developed. He is cooperative. No distress.  HENT:  Head: Normocephalic and atraumatic.  Eyes: EOM are normal.  Neck: Normal range of motion. Neck supple. No tracheal deviation present. No thyromegaly present.  Cardiovascular: Normal rate, S1 normal and S2 normal. Exam reveals no gallop.  No murmur heard. Pulses:      Dorsalis pedis pulses are 1+ on the right side, and 1+ on the left side.       Posterior tibial pulses are 1+ on the right side, and 1+ on the left side.  Pulmonary/Chest: Effort normal. No respiratory distress. He has no wheezes.  Abdominal: He exhibits no distension. There is no tenderness. There is no guarding and no CVA tenderness.  Musculoskeletal: He exhibits no edema.       Right shoulder: He exhibits no swelling and no deformity.  Neurological: He is alert and oriented to person, place, and time. He has normal strength and normal reflexes. No cranial nerve  deficit or sensory deficit. Gait normal.  Skin: Skin is warm and dry. No rash noted. No cyanosis. Nails show no clubbing.  Psychiatric:  He has a normal mood and affect. His speech is normal. Judgment normal. Cognition and memory are normal.    CMP ( most recent) CMP     Component Value Date/Time   NA 136 10/20/2017 1007   K 4.4 10/20/2017 1007   CL 98 10/20/2017 1007   CO2 31 10/20/2017 1007   GLUCOSE 105 10/20/2017 1007   BUN 23 10/20/2017 1007   CREATININE 1.38 (H) 10/20/2017 1007   CALCIUM 9.3 10/20/2017 1007   PROT 6.9 10/20/2017 1007   ALBUMIN 3.1 (L) 04/07/2016 0719   AST 27 10/20/2017 1007   ALT 23 10/20/2017 1007   ALKPHOS 324 (H) 04/07/2016 0719   BILITOT 0.8 10/20/2017 1007   GFRNONAA 51 (L) 10/20/2017 1007   GFRAA 60 10/20/2017 1007     Diabetic Labs (most recent): Lab Results  Component Value Date   HGBA1C 9.1 (H) 10/20/2017   HGBA1C 5.3 01/10/2016   HGBA1C 7.5 (A) 02/25/2012   Lipid Panel     Component Value Date/Time   CHOL 112 02/25/2012 1034   TRIG 110 02/25/2012 1034    Assessment & Plan:   1. Type 2 diabetes mellitus with stage 3 chronic kidney disease, with long-term current use of insulin (HCC)  - Jeffrey [name] has currently uncontrolled symptomatic type 2 DM since 72 years of age. -His most recent labs show A1c of 9.1% consistent with uncontrolled diabetes. Recent labs reviewed.  -his diabetes is complicated by insufficiency, obesity/sedentary life, history of smoking and Jeffrey [name] remains at a high risk for more acute and chronic complications which include CAD, CVA, CKD, retinopathy, and neuropathy. These are all discussed in detail with the patient.  - I have counseled him on diet management and weight loss, by adopting a carbohydrate restricted/protein rich diet.  -  Suggestion is made for him to avoid simple carbohydrates  from his diet including Cakes, Sweet Desserts / Pastries, Ice Cream, Soda (diet and regular), Sweet Tea, Candies,  Chips, Cookies, Store Bought Juices, Alcohol in Excess of  1-2 drinks a day, Artificial Sweeteners, and "Sugar-free" Products. This will help patient to have stable blood glucose profile and potentially avoid unintended weight gain.  - I encouraged him to switch to  unprocessed or minimally processed complex starch and increased protein intake (animal or plant source), fruits, and vegetables.  - he is advised to stick to a routine mealtimes to eat 3 meals  a day and avoid unnecessary snacks ( to snack only to correct hypoglycemia).   - he has been  scheduled with Jearld Fenton, RDN, CDE for individualized diabetes education.  - I have approached him with the following individualized plan to manage diabetes and patient agrees:   -Rest on his presentation with significantly above target glycemic profile, he will continue to require higher dose of insulin in order for him to achieve control of diabetes. -I advised him to increase Tresiba to 100  units and switch it to bedtime, increase his Humalog to 20  10 units  3 times a day with meals  for pre-meal BG readings of 90-150mg /dl, plus patient specific correction dose for unexpected hyperglycemia above 150mg /dl, associated with strict monitoring of glucose 4 times a day-before meals and at bedtime. - Patient is warned not to take insulin without proper monitoring per orders. -Adjustment parameters are given for hypo and hyperglycemia in writing. -Patient is encouraged to call clinic for blood glucose levels less than 70 or above 300 mg /dl. - I advised him  to continue  Januvia 50 mg p.o. every morning with breakfast, therapeutically suitable for patient . - I will reinitiate low-dose metformin, 500 mg p.o. twice daily.   -Patient is not a candidate for SGLT2 inhibitors due to CKD.   - Patient specific target  A1c;  LDL, HDL, Triglycerides, and  Waist Circumference were discussed in detail.  2) BP/HTN: His blood pressure is controlled.  I advised  him to continue current medications . 3) Lipids/HPL:   He is lipid panel from December 2018 showed LDL at 80.   Patient is advised to continue 20 mg p.o. nightly.   4)  Weight/Diet: CDE Consult has been initiated , exercise, and detailed carbohydrates information provided.  5) Chronic Care/Health Maintenance:  -he  is on  Statin medications and  is encouraged to continue to follow up with Ophthalmology, Dentist,  Podiatrist at least yearly or according to recommendations, and advised to  stay away from smoking. I have recommended yearly flu vaccine and pneumonia vaccination at least every 5 years; moderate intensity exercise for up to 150 minutes weekly; and  sleep for at least 7 hours a day.  - I advised patient to maintain close follow up with Jeffrey Blitz, MD for primary care needs.  - Time spent with the patient: 25 min, of which >50% was spent in reviewing his blood glucose logs , discussing his hypo- and hyper-glycemic episodes, reviewing his current and  previous labs and insulin doses and developing a plan to avoid hypo- and hyper-glycemia. Please refer to Patient Instructions for Blood Glucose Monitoring and Insulin/Medications Dosing Guide"  in media tab for additional information. Pollie Friar participated in the discussions, expressed understanding, and voiced agreement with the above plans.  All questions were answered to his satisfaction. he is encouraged to contact clinic should he have any questions or concerns prior to his return visit.   Follow up plan: - Return in about 3 weeks (around 11/23/2017) for follow up with meter and logs- no labs.  Jeffrey Lloyd, MD [facility] [address] [place], [place] [address] Phone: [phone]  Fax: [phone]    11/02/2017, 1:36 PM  This note was partially dictated with voice recognition software. Similar sounding words can be transcribed inadequately or may not  be corrected upon  review.

## 2017-11-24 ENCOUNTER — Ambulatory Visit (INDEPENDENT_AMBULATORY_CARE_PROVIDER_SITE_OTHER): Payer: Medicare Other | Admitting: "Endocrinology

## 2017-11-24 ENCOUNTER — Encounter: Payer: Self-pay | Admitting: "Endocrinology

## 2017-11-24 VITALS — BP 111/69 | HR 67 | Ht 65.75 in | Wt 242.0 lb

## 2017-11-24 DIAGNOSIS — N183 Chronic kidney disease, stage 3 unspecified: Secondary | ICD-10-CM

## 2017-11-24 DIAGNOSIS — E1122 Type 2 diabetes mellitus with diabetic chronic kidney disease: Secondary | ICD-10-CM

## 2017-11-24 DIAGNOSIS — Z794 Long term (current) use of insulin: Secondary | ICD-10-CM

## 2017-11-24 DIAGNOSIS — I10 Essential (primary) hypertension: Secondary | ICD-10-CM | POA: Diagnosis not present

## 2017-11-24 DIAGNOSIS — E782 Mixed hyperlipidemia: Secondary | ICD-10-CM

## 2017-11-24 MED ORDER — FREESTYLE LIBRE 14 DAY SENSOR MISC: 1.0000 | 2 refills | Status: DC

## 2017-11-24 MED ORDER — FREESTYLE LIBRE 14 DAY READER DEVI: 1.0000 | Freq: Once | 0 refills | Status: AC

## 2017-11-24 MED ORDER — INSULIN LISPRO 100 UNIT/ML (KWIKPEN): 25.0000 [IU] | PEN_INJECTOR | Freq: Three times a day (TID) | SUBCUTANEOUS | 0 refills | Status: DC

## 2017-11-24 NOTE — Patient Instructions (Signed)

## 2017-11-24 NOTE — Progress Notes (Signed)
Endocrinology follow-up note       11/24/2017, 1:31 PM   Subjective:    Patient ID: Jeffrey [name], male    DOB: April 24, 1946.  Jeffrey [name] is being seen in follow-up for management of currently uncontrolled symptomatic diabetes requested by  Monico Blitz, MD.   Past Medical History:  Diagnosis Date  . Alcoholic cirrhosis ([place])    patient reports completing hep a and b vaccines in 2006  . Anemia    has had 3 units of prbcs 2013  . Anxiety   . Asymptomatic gallstones    Ultrasound in 2006  . Depression   . Diabetes ([place])   . Duodenal ulcer with hemorrhage    per patient in 2006 or 2007, records have been requested  . 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 History of alcohol abuse    quit in 2006  . Hypertension   . Splenomegaly    Ultrasound in 2006  . Tubular adenoma of colon    Past Surgical History:  Procedure Laterality Date  . APPENDECTOMY    . COLONOSCOPY  03/10/2005   Rectal polyp as described above, removed with snare. Left sided  diverticula. The remainder of the colonic mucosa appeared normal. Inflammed polyp on path.  . COLONOSCOPY  04/1999   Dr. Thea Silversmith polyps removed,  Path showed hyperplastic  . COLONOSCOPY WITH ESOPHAGOGASTRODUODENOSCOPY (EGD)  03/31/2012   OHY:[id number] AVMs. Colonic diverticulosis. tubular adenoma colon  . ESOPHAGOGASTRODUODENOSCOPY  01/15/2005   Three columns grade 1 to 2 esophageal varices, otherwise normal esophageal mucosa.  Esophagus was not manipulated otherwise./Nodularity of the antrum with overlying erosions, nonspecific finding. Path showed rare H.pylori  . ESOPHAGOGASTRODUODENOSCOPY  09/2008   Dr. Gaylene Brooks columns of grade 2-3 esoph varices, only one column was prominent. Portal gastropathy, multiple gastrc polyps at antrum, two were 2cm with black eschar, bulbar polyps, bulbar erosions  . ESOPHAGOGASTRODUODENOSCOPY  11/2004   Dr.  Brantley Stage 3 esoph varices  . ESOPHAGOGASTRODUODENOSCOPY  03/31/2012   RMR: 4 columns(3-GR 2, 1-GR1) non-bleeding esophageal varices, portal gastropathy, small HH, early GAVE, multiple gastric polyps   . GASTRIC VARICES BANDING  03/31/2012   Procedure: GASTRIC VARICES BANDING;  Surgeon: Daneil Dolin, MD;  Location: [facility];  Service: Endoscopy;  Laterality: N/A;   Social History   Socioeconomic History  . Marital status: Divorced    Spouse name: Not on file  . Number of children: 2  . Years of education: Not on file  . Highest education level: Not on file  Occupational History  . Occupation: Disabled    Employer: SELF EMPLOYED  Social Needs  . Financial resource strain: Not on file  . Food insecurity:    Worry: Not on file    Inability: Not on file  . Transportation needs:    Medical: Not on file    Non-medical: Not on file  Tobacco Use  . Smoking status: Former Smoker    Packs/day: 0.50    Types: Cigarettes  . Smokeless tobacco: Never Used  . Tobacco comment: qiut about 25+ years  Substance and Sexual Activity  . Alcohol use:  No    Comment: quit in 2006  . Drug use: No  . Sexual activity: Not Currently  Lifestyle  . Physical activity:    Days per week: Not on file    Minutes per session: Not on file  . Stress: Not on file  Relationships  . Social connections:    Talks on phone: Not on file    Gets together: Not on file    Attends religious service: Not on file    Active member of club or organization: Not on file    Attends meetings of clubs or organizations: Not on file    Relationship status: Not on file  Other Topics Concern  . Not on file  Social History Narrative   Two step children from second marriage (divorced) who live with him along with some grandchildren.    Outpatient Encounter Medications as of 11/24/2017  Medication Sig  . Ascorbic Acid (VITAMIN C) 1000 MG tablet Take 1,000 mg by mouth 2 (two) times daily.  . Continuous Blood Gluc  Receiver (FREESTYLE LIBRE 14 DAY READER) DEVI 1 each by Does not apply route once for 1 dose.  . Continuous Blood Gluc Sensor (FREESTYLE LIBRE 14 DAY SENSOR) MISC Inject 1 each into the skin every 14 (fourteen) days. Use as directed.  . gabapentin (NEURONTIN) 100 MG capsule Take 100 mg by mouth 3 (three) times daily.  . Garlic 2025 MG CAPS Take 1 capsule by mouth daily.   . insulin degludec (TRESIBA FLEXTOUCH) 100 UNIT/ML SOPN FlexTouch Pen Inject 100 Units into the skin at bedtime.  . insulin lispro (HUMALOG KWIKPEN) 100 UNIT/ML KiwkPen Inject 0.25-0.31 mLs (25-31 Units total) into the skin 3 (three) times daily before meals.  . lactulose (CONSTULOSE) 10 GM/15ML solution Take 40 g by mouth 3 (three) times daily.  . metFORMIN (GLUCOPHAGE) 500 MG tablet Take 1 tablet (500 mg total) by mouth 2 (two) times daily with a meal.  . milk thistle 175 MG tablet Take 175 mg by mouth daily.  . mirtazapine (REMERON) 15 MG tablet Take 15 mg by mouth at bedtime.  . Multiple Vitamin (MULTIVITAMIN) capsule Take 1 capsule by mouth daily.  . Omega 3 1000 MG CAPS Take by mouth 2 (two) times daily.  [name] omeprazole (PRILOSEC) 20 MG capsule TAKE 1 CAPSULE BY MOUTH EVERY DAY (Patient taking differently: TAKE 1 CAPSULE BY MOUTH EVERY DAY 40 mg)  . pravastatin (PRAVACHOL) 20 MG tablet Take 20 mg by mouth daily.  . propranolol (INDERAL) 40 MG tablet TAKE 1 TABLET BY MOUTH TWICE DAILY  . sitaGLIPtin (JANUVIA) 100 MG tablet Take 50 mg by mouth daily.  [name] spironolactone (ALDACTONE) 100 MG tablet Take 25 mg by mouth daily.   . tamsulosin (FLOMAX) 0.4 MG CAPS capsule Take 0.4 mg by mouth.  [name] tiZANidine (ZANAFLEX) 2 MG tablet Take by mouth every 6 (six) hours as needed for muscle spasms.  [name] venlafaxine (EFFEXOR) 75 MG tablet Take 75 mg by mouth 2 (two) times daily.   . vitamin E 400 UNIT capsule Take 400 Units by mouth daily.  . [DISCONTINUED] insulin lispro (HUMALOG KWIKPEN) 100 UNIT/ML KiwkPen Inject 25-31 Units into the skin 3  (three) times daily before meals.   No facility-administered encounter medications on file as of 11/24/2017.     ALLERGIES: No Known Allergies  VACCINATION STATUS:  There is no immunization history on file for this patient.  Diabetes  He presents for his follow-up diabetic visit. He has type 2 diabetes mellitus. Onset  time: He was diagnosed at approximate age of 16 years. His disease course has been worsening. Pertinent negatives for hypoglycemia include no confusion, headaches, pallor or seizures. Associated symptoms include polydipsia and polyuria. Pertinent negatives for diabetes include no chest pain, no fatigue, no polyphagia and no weakness. Symptoms are worsening. Risk factors for coronary artery disease include male sex, obesity, hypertension, dyslipidemia, family history and diabetes mellitus. Current diabetic treatments: He is taking Humulin 25 units nightly, Tresiba 100 units every morning, Humalog 30 units with breakfast, 30 units with lunch, 50 units with dinner.  He is also on metformin 1000 mg p.o. BID, Januvia 100 mg once a day. He is compliant with treatment most of the time. His weight is increasing steadily. He is following a generally unhealthy diet. When asked about meal planning, he reported none. He has had a previous visit with a dietitian (Patient is working with a Microbiologist in [place].). His breakfast blood glucose range is generally >200 mg/dl. His lunch blood glucose range is generally >200 mg/dl. His dinner blood glucose range is generally >200 mg/dl. His bedtime blood glucose range is generally >200 mg/dl. His overall blood glucose range is >200 mg/dl. An ACE inhibitor/angiotensin II receptor blocker is being taken. Eye exam is current.  Hypertension  This is a chronic problem. The current episode started more than 1 year ago. The problem is uncontrolled. Pertinent negatives include no chest pain, headaches, neck pain, palpitations or shortness of breath. Risk  factors for coronary artery disease include diabetes mellitus, dyslipidemia, obesity, male gender, sedentary lifestyle and smoking/tobacco exposure. Hypertensive end-organ damage includes kidney disease.    Review of Systems  Constitutional: Negative for chills, fatigue, fever and unexpected weight change.  HENT: Negative for dental problem, mouth sores and trouble swallowing.   Eyes: Negative for visual disturbance.  Respiratory: Negative for cough, choking, chest tightness, shortness of breath and wheezing.   Cardiovascular: Negative for chest pain, palpitations and leg swelling.  Gastrointestinal: Negative for abdominal distention, abdominal pain, constipation, diarrhea, nausea and vomiting.  Endocrine: Positive for polydipsia and polyuria. Negative for polyphagia.  Genitourinary: Negative for dysuria, flank pain, hematuria and urgency.  Musculoskeletal: Negative for back pain, gait problem, myalgias and neck pain.  Skin: Negative for pallor, rash and wound.  Neurological: Negative for seizures, syncope, weakness, numbness and headaches.  Psychiatric/Behavioral: Negative.  Negative for confusion and dysphoric mood.    Objective:    BP 111/69   Pulse 67   Ht 5' 5.75" (1.67 m)   Wt 242 lb (109.8 kg)   BMI 39.36 kg/m   Wt Readings from Last 3 Encounters:  11/24/17 242 lb (109.8 kg)  11/02/17 239 lb (108.4 kg)  10/20/17 246 lb (111.6 kg)     Physical Exam  Constitutional: He is oriented to person, place, and time. He appears well-developed. He is cooperative. No distress.  HENT:  Head: Normocephalic and atraumatic.  Eyes: EOM are normal.  Neck: Normal range of motion. Neck supple. No tracheal deviation present. No thyromegaly present.  Cardiovascular: Normal rate, S1 normal and S2 normal. Exam reveals no gallop.  No murmur heard. Pulses:      Dorsalis pedis pulses are 1+ on the right side, and 1+ on the left side.       Posterior tibial pulses are 1+ on the right side, and 1+  on the left side.  Pulmonary/Chest: Effort normal. No respiratory distress. He has no wheezes.  Abdominal: He exhibits no distension. There is no tenderness. There  is no guarding and no CVA tenderness.  Musculoskeletal: He exhibits no edema.       Right shoulder: He exhibits no swelling and no deformity.  Neurological: He is alert and oriented to person, place, and time. He has normal strength and normal reflexes. No cranial nerve deficit or sensory deficit. Gait normal.  Skin: Skin is warm and dry. No rash noted. No cyanosis. Nails show no clubbing.  Psychiatric: He has a normal mood and affect. His speech is normal. Judgment normal. Cognition and memory are normal.    CMP ( most recent) CMP     Component Value Date/Time   NA 136 10/20/2017 1007   K 4.4 10/20/2017 1007   CL 98 10/20/2017 1007   CO2 31 10/20/2017 1007   GLUCOSE 105 10/20/2017 1007   BUN 23 10/20/2017 1007   CREATININE 1.38 (H) 10/20/2017 1007   CALCIUM 9.3 10/20/2017 1007   PROT 6.9 10/20/2017 1007   ALBUMIN 3.1 (L) 04/07/2016 0719   AST 27 10/20/2017 1007   ALT 23 10/20/2017 1007   ALKPHOS 324 (H) 04/07/2016 0719   BILITOT 0.8 10/20/2017 1007   GFRNONAA 51 (L) 10/20/2017 1007   GFRAA 60 10/20/2017 1007     Diabetic Labs (most recent): Lab Results  Component Value Date   HGBA1C 9.1 (H) 10/20/2017   HGBA1C 5.3 01/10/2016   HGBA1C 7.5 (A) 02/25/2012   Lipid Panel     Component Value Date/Time   CHOL 112 02/25/2012 1034   TRIG 110 02/25/2012 1034    Assessment & Plan:   1. Type 2 diabetes mellitus with stage 3 chronic kidney disease, with long-term current use of insulin (HCC)  - Jeffrey [name] has currently uncontrolled symptomatic type 2 DM since 72 years of age. -He returns with persistently above target glycemic profile both fasting and postprandial.  This is largely due to his indiscretion to processed carbohydrates including cakes, soda.    His most recent labs show A1c of 9.1% consistent with  uncontrolled diabetes. Recent labs reviewed.  -his diabetes is complicated by renal insufficiency, obesity/sedentary life, history of smoking and Jeffrey [name] remains at a high risk for more acute and chronic complications which include CAD, CVA, CKD, retinopathy, and neuropathy. These are all discussed in detail with the patient.  - I have counseled him on diet management and weight loss, by adopting a carbohydrate restricted/protein rich diet.  -  Suggestion is made for him to avoid simple carbohydrates  from his diet including Cakes, Sweet Desserts / Pastries, Ice Cream, Soda (diet and regular), Sweet Tea, Candies, Chips, Cookies, Store Bought Juices, Alcohol in Excess of  1-2 drinks a day, Artificial Sweeteners, and "Sugar-free" Products. This will help patient to have stable blood glucose profile and potentially avoid unintended weight gain.  - I encouraged him to switch to  unprocessed or minimally processed complex starch and increased protein intake (animal or plant source), fruits, and vegetables.  - he is advised to stick to a routine mealtimes to eat 3 meals  a day and avoid unnecessary snacks ( to snack only to correct hypoglycemia).   - he has been  scheduled with Jearld Fenton, RDN, CDE for individualized diabetes education.  - I have approached him with the following individualized plan to manage diabetes and patient agrees:   -Based on his presentation with significantly above target glycemic profile, he will continue to require higher dose of insulin in order for him to achieve control of diabetes. -He is  a good candidate for insulin U500.  However, he has enough supplies of [place] for another 4 weeks. -I advised him to continue [place] to 100  units and switch it to bedtime, increase his Humalog to 25 units  3 times a day with meals  for pre-meal BG readings of 90-150mg /dl, plus patient specific correction dose for unexpected hyperglycemia above 150mg /dl, associated with strict  monitoring of glucose 4 times a day-before meals and at bedtime. - Patient is warned not to take insulin without proper monitoring per orders. -Adjustment parameters are given for hypo and hyperglycemia in writing. -Patient is encouraged to call clinic for blood glucose levels less than 70 or above 300 mg /dl. - I advised him to continue  Januvia 50 mg p.o. every morning with breakfast, therapeutically suitable for patient . -He is advised to continue low-dose metformin 500 mg p.o. twice daily after breakfast and supper.    -Patient is not a candidate for SGLT2 inhibitors due to CKD. -He will be switched to insulin U500 when he returns with his leftover all other kinds of insulin in 4 weeks.  - Patient specific target  A1c;  LDL, HDL, Triglycerides, and  Waist Circumference were discussed in detail.  2) BP/HTN: His blood pressure is controlled to target.    I advised him to continue current medications . 3) Lipids/HPL:   He is lipid panel from December 2018 showed LDL at 80.   He is advised to continue pravastatin 20 mg p.o. nightly.    4)  Weight/Diet: CDE Consult has been initiated , exercise, and detailed carbohydrates information provided.  5) Chronic Care/Health Maintenance:  -he  is on  Statin medications and  is encouraged to continue to follow up with Ophthalmology, Dentist,  Podiatrist at least yearly or according to recommendations, and advised to  stay away from smoking. I have recommended yearly flu vaccine and pneumonia vaccination at least every 5 years; moderate intensity exercise for up to 150 minutes weekly; and  sleep for at least 7 hours a day.  - I advised patient to maintain close follow up with Monico Blitz, MD for primary care needs.  - Time spent with the patient: 25 min, of which >50% was spent in reviewing his blood glucose logs , discussing his hypo- and hyper-glycemic episodes, reviewing his current and  previous labs and insulin doses and developing a plan to avoid  hypo- and hyper-glycemia. Please refer to Patient Instructions for Blood Glucose Monitoring and Insulin/Medications Dosing Guide"  in media tab for additional information. Jeffrey [name] participated in the discussions, expressed understanding, and voiced agreement with the above plans.  All questions were answered to his satisfaction. he is encouraged to contact clinic should he have any questions or concerns prior to his return visit.  Follow up plan: - Return in about 1 month (around 12/22/2017), or bring all leftover insulin with you., for follow up with meter and logs- no labs.  Glade Lloyd, MD [facility] [address] [place], [place] [address] Phone: [phone]  Fax: [phone]    11/24/2017, 1:31 PM  This note was partially dictated with voice recognition software. Similar sounding words can be transcribed inadequately or may not  be corrected upon review.

## 2017-11-25 ENCOUNTER — Telehealth: Payer: Self-pay | Admitting: Gastroenterology

## 2017-11-25 ENCOUNTER — Encounter: Payer: Self-pay | Admitting: Gastroenterology

## 2017-11-25 ENCOUNTER — Ambulatory Visit: Payer: Medicare Other | Admitting: Gastroenterology

## 2017-11-25 VITALS — BP 117/66 | HR 78 | Temp 97.0°F | Ht 67.0 in | Wt 239.6 lb

## 2017-11-25 DIAGNOSIS — K7030 Alcoholic cirrhosis of liver without ascites: Secondary | ICD-10-CM | POA: Insufficient documentation

## 2017-11-25 DIAGNOSIS — Z800 Family history of malignant neoplasm of digestive organs: Secondary | ICD-10-CM | POA: Diagnosis not present

## 2017-11-25 NOTE — Patient Instructions (Signed)
1. We will request copy of last ultrasound from [name]. If you need updated ultrasound we will let you know. 2. Please have your labs done at [facility] at your convenience.  3. Return to the office in 3 months.

## 2017-11-25 NOTE — Telephone Encounter (Signed)
Please request records regarding last colonoscopy and pathology from PCP and/or [facility] R possibly done November 29, 2015.

## 2017-11-25 NOTE — Assessment & Plan Note (Signed)
Patient is overdue for surveillance colonoscopy however in light of comorbidities benefits may not outweigh the risk.  Obtain labs to determine hepatic function.  Will discuss further with Dr. Gala Romney.

## 2017-11-25 NOTE — Progress Notes (Signed)
Primary Care Physician: Monico Blitz, MD  Primary Gastroenterologist:    Chief Complaint  Patient presents with  . Cirrhosis    f/u.     HPI: Jeffrey [name] is a 72 y.o. male here to re-establish care for management of his alcoholic cirrhosis at the request of [name]. He was last seen by our practice in 2017. Last seen by hepatology in 05/2016 at [facility]. He was hospitalized from February 21, 2016 to March 27, 2016 for incarcerated umbilical hernia and pneumatosis intestinalis.  During the hospitalization he underwent exploratory laparotomy twice and abdominal washout as well as broad-spectrum antibiotic therapy secondary to code sepsis.  Hospitalization also complicated by hepatic encephalopathy.  His last abdominal ultrasound was in February 2018 and his last EGD was in September 2017.  EGD showed portal hypertensive gastropathy, multiple sessile polyps in the gastric antrum ranging 5 mm to 1.5 cm.  Largest polyp removed.  Multiple sessile polyps measuring 3 to 12 mm in the duodenal bulb and second part of the duodenum status post multiple biopsies.  Esophagus appeared to be normal.  Gastric biopsy showed a hyperplastic polyp with ulceration, no H. pylori.  Duodenal biopsies were benign with erosions.  Weight 223 pounds in August 2017, 239 today.  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 coli.  At the time he was found to have gram-positive cocci coagulase-negative in the blood and ascitic fluid cultures but without overt evidence of SBP given low neutrophil count on  ascitic fluid.  At this time it was felt that it was possibly contaminant and there were plans for repeat abdominal paracenteses but there was not enough ascites for re-tap.  Since August 20 17, he denies any further need for paracenteses.  Clinically he is doing well.  He denies any significant issues with swelling.  Towards the end of the day he does have some  mild lower extremity edema around the ankles.  His appetite is good.  He denies abdominal pain.  No heartburn, vomiting, dysphagia.  No melena or rectal bleeding.  He has been sober for almost 5 years.  He takes 40 cc of lactulose 3 times daily.  Typically has 3-4 soft to loose stools.  Admits that he increase his dose because his ammonia level was elevated.  No recent episodes of encephalopathy or concerning symptoms outside of when he was in the hospital in 2017 with sepsis.  Current Outpatient Medications  Medication Sig Dispense Refill  . Ascorbic Acid (VITAMIN C) 1000 MG tablet Take 1,000 mg by mouth 2 (two) times daily.    [name] gabapentin (NEURONTIN) 100 MG capsule Take 100 mg by mouth 3 (three) times daily.    . Garlic 0160 MG CAPS Take 1 capsule by mouth daily.     . insulin degludec (TRESIBA FLEXTOUCH) 100 UNIT/ML SOPN FlexTouch Pen Inject 100 Units into the skin at bedtime.    . insulin lispro (HUMALOG KWIKPEN) 100 UNIT/ML KiwkPen Inject 0.25-0.31 mLs (25-31 Units total) into the skin 3 (three) times daily before meals. 5 pen 0  . iron polysaccharides (NIFEREX) 150 MG capsule Take 150 mg by mouth daily.    [name] lactulose (CONSTULOSE) 10 GM/15ML solution Take 30 g by mouth 3 (three) times daily.     . metFORMIN (GLUCOPHAGE) 500 MG tablet Take 1 tablet (500 mg total) by mouth 2 (two) times daily with a meal. 60 tablet 2  . milk thistle 175 MG  tablet Take 175 mg by mouth daily.    . mirtazapine (REMERON) 15 MG tablet Take 15 mg by mouth at bedtime.    . Multiple Vitamin (MULTIVITAMIN) capsule Take 1 capsule by mouth daily.    . Omega 3 1000 MG CAPS Take by mouth 2 (two) times daily.    [name] omeprazole (PRILOSEC) 20 MG capsule TAKE 1 CAPSULE BY MOUTH EVERY DAY (Patient taking differently: TAKE 1 CAPSULE BY MOUTH EVERY DAY 40 mg) 30 capsule 11  . pravastatin (PRAVACHOL) 20 MG tablet Take 20 mg by mouth daily.    . propranolol (INDERAL) 40 MG tablet TAKE 1 TABLET BY MOUTH TWICE DAILY 60 tablet 1  .  rifaximin (XIFAXAN) 550 MG TABS tablet Take 550 mg by mouth 3 (three) times daily.    . sitaGLIPtin (JANUVIA) 100 MG tablet Take 50 mg by mouth daily.    [name] spironolactone (ALDACTONE) 100 MG tablet Take 100 mg by mouth daily.     . tamsulosin (FLOMAX) 0.4 MG CAPS capsule Take 0.4 mg by mouth.    . thiamine 100 MG tablet Take 100 mg by mouth daily.    [name] tiZANidine (ZANAFLEX) 2 MG tablet Take by mouth every 6 (six) hours as needed for muscle spasms.    [name] torsemide (DEMADEX) 20 MG tablet Take 40 mg by mouth 2 (two) times daily.    . Turmeric 500 MG TABS Take by mouth daily.    [name] venlafaxine (EFFEXOR) 75 MG tablet Take 75 mg by mouth 3 (three) times daily with meals.     . vitamin E 400 UNIT capsule Take 400 Units by mouth daily.    . Continuous Blood Gluc Sensor (FREESTYLE LIBRE 14 DAY SENSOR) MISC Inject 1 each into the skin every 14 (fourteen) days. Use as directed. 2 each 2   No current facility-administered medications for this visit.     Allergies as of 11/25/2017  . (No Known Allergies)   Past Medical History:  Diagnosis Date  . Alcoholic cirrhosis ([place])    patient reports completing hep a and b vaccines in 2006  . Anemia    has had 3 units of prbcs 2013  . Anxiety   . Asymptomatic gallstones    Ultrasound in 2006  . Depression   . Diabetes ([place])   . Duodenal ulcer with hemorrhage    per patient in 2006 or 2007, records have been requested  . 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 Hypertension   . Splenomegaly    Ultrasound in 2006  . Tubular adenoma of colon    Past Surgical History:  Procedure Laterality Date  . APPENDECTOMY    . COLONOSCOPY  03/10/2005   Rectal polyp as described above, removed with snare. Left sided  diverticula. The remainder of the colonic mucosa appeared normal. Inflammed polyp on path.  . COLONOSCOPY  04/1999   Dr. Thea Silversmith polyps removed,  Path showed hyperplastic  . COLONOSCOPY WITH  ESOPHAGOGASTRODUODENOSCOPY (EGD)  03/31/2012   WEX:HBZJIRC AVMs. Colonic diverticulosis. tubular adenoma colon  . ESOPHAGOGASTRODUODENOSCOPY  01/15/2005   Three columns grade 1 to 2 esophageal varices, otherwise normal esophageal mucosa.  Esophagus was not manipulated otherwise./Nodularity of the antrum with overlying erosions, nonspecific finding. Path showed rare H.pylori  . ESOPHAGOGASTRODUODENOSCOPY  09/2008   Dr. Gaylene Brooks columns of grade 2-3 esoph varices, only one column was prominent. Portal gastropathy, multiple gastrc polyps at antrum, two were 2cm  with black eschar, bulbar polyps, bulbar erosions  . ESOPHAGOGASTRODUODENOSCOPY  11/2004   Dr. Brantley Stage 3 esoph varices  . ESOPHAGOGASTRODUODENOSCOPY  03/31/2012   RMR: 4 columns(3-GR 2, 1-GR1) non-bleeding esophageal varices, portal gastropathy, small HH, early GAVE, multiple gastric polyps   . GASTRIC VARICES BANDING  03/31/2012   Procedure: GASTRIC VARICES BANDING;  Surgeon: Daneil Dolin, MD;  Location: [facility];  Service: Endoscopy;  Laterality: N/A;  . UMBILICAL HERNIA REPAIR  2017   [facility]   Family History  Problem Relation Age of Onset  . Colon cancer Father 58       deceased age 4  . Breast cancer Sister        deceased  . Breast cancer Sister   . Lung cancer Neg Hx   . Ovarian cancer Neg Hx    Social History   Tobacco Use  . Smoking status: Former Smoker    Packs/day: 0.50    Types: Cigarettes  . Smokeless tobacco: Never Used  . Tobacco comment: qiut about 25+ years  Substance Use Topics  . Alcohol use: No    Comment: quit in 05/2013  . Drug use: No    ROS:  General: Negative for anorexia, weight loss, fever, chills, fatigue, weakness. ENT: Negative for hoarseness, difficulty swallowing , nasal congestion. CV: Negative for chest pain, angina, palpitations, dyspnea on exertion, peripheral edema.  Respiratory: Negative for dyspnea at rest, dyspnea on exertion, cough, sputum, wheezing.  GI:  See history of present illness. GU:  Negative for dysuria, hematuria, urinary incontinence, urinary frequency, nocturnal urination.  Endo: Negative for unusual weight change.    Physical Examination:   BP 117/66   Pulse 78   Temp (!) 97 F (36.1 C) (Oral)   Ht 5\' 7"  (1.702 m)   Wt 239 lb 9.6 oz (108.7 kg)   BMI 37.53 kg/m   General: Well-nourished, well-developed in no acute distress.  Eyes: No icterus. Mouth: Oropharyngeal mucosa moist and pink , no lesions erythema or exudate. Lungs: Clear to auscultation bilaterally.  Heart: Regular rate and rhythm, no murmurs rubs or gallops.  Abdomen: Bowel sounds are normal, nontender, nondistended, no hepatosplenomegaly or masses, no abdominal bruits or hernia , no rebound or guarding.   Extremities: trace lower extremity edema. No clubbing or deformities. Neuro: Alert and oriented x 4   Skin: Warm and dry, no jaundice.   Psych: Alert and cooperative, normal mood and affect.  Labs:  Lab Results  Component Value Date   TSH 1.90 10/20/2017   Lab Results  Component Value Date   HGBA1C 9.1 (H) 10/20/2017   Lab Results  Component Value Date   CREATININE 1.38 (H) 10/20/2017   BUN 23 10/20/2017   NA 136 10/20/2017   K 4.4 10/20/2017   CL 98 10/20/2017   CO2 31 10/20/2017   Lab Results  Component Value Date   ALT 23 10/20/2017   AST 27 10/20/2017   ALKPHOS 78 10/20/2017   BILITOT 0.8 10/20/2017   CBC December 2018, white blood cell count 2400, hemoglobin 10.6, hematocrit 32.8, MCV 94, platelet    Imaging Studies: No results found.

## 2017-11-25 NOTE — Assessment & Plan Note (Signed)
Patient has been sober for almost 5 years.  Generally his cirrhosis has been stable.  He has had previous esophageal varices, no bleeding.  He is on nonselective beta-blocker.  Last EGD in 2017 was normal esophagus, done at [facility].  He is overdue for hepatoma screening via ultrasound although he tells me he has had an ultrasound with his PCP, we will get a copy for review.  Update his labs today.  He will continue Aldactone, Xifaxan, Inderal as before.  Regarding his lactulose, I discussed how he can titrate to 2-3 soft stools daily.  Also discussed that we do not need to monitor his ammonia level to determine lactulose dosing, evaluate clinically for evidence of hepatic encephalopathy.  Warning signs provided to patient and wife today.  He will come back in 3 months for follow-up.  Consider EGD to screen for esophageal varices sometime in the near future.  Await labs.

## 2017-11-26 ENCOUNTER — Encounter: Payer: Self-pay | Admitting: Internal Medicine

## 2017-11-29 NOTE — Progress Notes (Signed)
cc'd to pcp 

## 2017-11-29 NOTE — Telephone Encounter (Signed)
Requested records from [facility] °

## 2017-11-30 ENCOUNTER — Other Ambulatory Visit: Payer: Self-pay | Admitting: Gastroenterology

## 2017-12-01 LAB — CBC WITH DIFFERENTIAL/PLATELET
Basophils Absolute: 31 cells/uL (ref 0–200)
Basophils Relative: 1.3 %
Eosinophils Absolute: 142 cells/uL (ref 15–500)
Eosinophils Relative: 5.9 %
HCT: 26.5 % — ABNORMAL LOW (ref 38.5–50.0)
Hemoglobin: 8.4 g/dL — ABNORMAL LOW (ref 13.2–17.1)
Lymphs Abs: 242 cells/uL — ABNORMAL LOW (ref 850–3900)
MCH: 27.8 pg (ref 27.0–33.0)
MCHC: 31.7 g/dL — ABNORMAL LOW (ref 32.0–36.0)
MCV: 87.7 fL (ref 80.0–100.0)
MPV: 11.3 fL (ref 7.5–12.5)
Monocytes Relative: 8.4 %
Neutro Abs: 1783 cells/uL (ref 1500–7800)
Neutrophils Relative %: 74.3 %
Platelets: 90 10*3/uL — ABNORMAL LOW (ref 140–400)
RBC: 3.02 10*6/uL — ABNORMAL LOW (ref 4.20–5.80)
RDW: 15.5 % — ABNORMAL HIGH (ref 11.0–15.0)
Total Lymphocyte: 10.1 %
WBC mixed population: 202 cells/uL (ref 200–950)
WBC: 2.4 10*3/uL — ABNORMAL LOW (ref 3.8–10.8)

## 2017-12-01 LAB — HEPATITIS C ANTIBODY
Hepatitis C Ab: NONREACTIVE
SIGNAL TO CUT-OFF: 0.05 (ref <1.00)

## 2017-12-01 LAB — HEPATITIS B SURFACE ANTIBODY,QUALITATIVE: Hep B S Ab: NONREACTIVE

## 2017-12-01 LAB — HEPATITIS A ANTIBODY, TOTAL: Hepatitis A AB,Total: REACTIVE — AB

## 2017-12-01 LAB — COMPLETE METABOLIC PANEL WITH GFR
AG Ratio: 1.3 (calc) (ref 1.0–2.5)
ALT: 21 U/L (ref 9–46)
AST: 27 U/L (ref 10–35)
Albumin: 3.8 g/dL (ref 3.6–5.1)
Alkaline phosphatase (APISO): 98 U/L (ref 40–115)
BUN/Creatinine Ratio: 17 (calc) (ref 6–22)
BUN: 30 mg/dL — ABNORMAL HIGH (ref 7–25)
CO2: 28 mmol/L (ref 20–32)
Calcium: 8.8 mg/dL (ref 8.6–10.3)
Chloride: 97 mmol/L — ABNORMAL LOW (ref 98–110)
Creat: 1.74 mg/dL — ABNORMAL HIGH (ref 0.70–1.18)
GFR, Est African American: 45 mL/min/{1.73_m2} — ABNORMAL LOW (ref >=60)
GFR, Est Non African American: 39 mL/min/{1.73_m2} — ABNORMAL LOW (ref >=60)
Globulin: 2.9 g/dL (calc) (ref 1.9–3.7)
Glucose, Bld: 172 mg/dL — ABNORMAL HIGH (ref 65–139)
Potassium: 4.1 mmol/L (ref 3.5–5.3)
Sodium: 135 mmol/L (ref 135–146)
Total Bilirubin: 0.7 mg/dL (ref 0.2–1.2)
Total Protein: 6.7 g/dL (ref 6.1–8.1)

## 2017-12-01 LAB — HEPATITIS B SURFACE ANTIGEN: Hepatitis B Surface Ag: NONREACTIVE

## 2017-12-01 LAB — AFP TUMOR MARKER: AFP-Tumor Marker: 1.7 ng/mL (ref <6.1)

## 2017-12-01 LAB — PROTIME-INR
INR: 1
Prothrombin Time: 11 s (ref 9.0–11.5)

## 2017-12-03 ENCOUNTER — Encounter: Payer: Self-pay | Admitting: Gastroenterology

## 2017-12-03 NOTE — Telephone Encounter (Signed)
Received ultrasound from 03/2017 by [facility] showing gallstones with gb wall thickening but no other evidence of cholecystitis, splenomegaly, fatty liver but liver not well seen.   Patient also had colonoscopy with Dr. Britta Mccreedy in 10/2015 with single tubular adenoma removed. Consider next colonoscopy in 5 years (10/2020) if benefits outweigh risks at that time.   NIC FOR TCS IN 10/2020.

## 2017-12-06 ENCOUNTER — Other Ambulatory Visit: Payer: Self-pay

## 2017-12-06 DIAGNOSIS — R748 Abnormal levels of other serum enzymes: Secondary | ICD-10-CM

## 2017-12-06 DIAGNOSIS — K7030 Alcoholic cirrhosis of liver without ascites: Secondary | ICD-10-CM

## 2017-12-06 NOTE — Telephone Encounter (Signed)
ON RECALL  °

## 2017-12-09 ENCOUNTER — Telehealth: Payer: Self-pay | Admitting: Internal Medicine

## 2017-12-09 NOTE — Telephone Encounter (Signed)
Called pt's wife, informed her he can have clear liquids morning of procedure until 9:00am. Offered to reschedule. She wants to keep procedure as previously scheduled.

## 2017-12-09 NOTE — Telephone Encounter (Signed)
[place] PATIENT WIFE, SHE SAID HE NEEDS TO RESCHEDULE HIS PROCEDURE BECAUSE HE IS DIABETIC AND NEEDS TO EAT BEFORE 12

## 2017-12-10 ENCOUNTER — Ambulatory Visit (HOSPITAL_COMMUNITY)
Admission: RE | Admit: 2017-12-10 | Discharge: 2017-12-10 | Disposition: A | Discharge status: Home / Self Care | Payer: Medicare Other | Source: Ambulatory Visit | Attending: Gastroenterology | Admitting: Gastroenterology

## 2017-12-10 DIAGNOSIS — K7030 Alcoholic cirrhosis of liver without ascites: Secondary | ICD-10-CM | POA: Diagnosis present

## 2017-12-10 DIAGNOSIS — R932 Abnormal findings on diagnostic imaging of liver and biliary tract: Secondary | ICD-10-CM | POA: Insufficient documentation

## 2017-12-10 DIAGNOSIS — K8020 Calculus of gallbladder without cholecystitis without obstruction: Secondary | ICD-10-CM | POA: Diagnosis not present

## 2017-12-10 LAB — BASIC METABOLIC PANEL
BUN/Creatinine Ratio: 20 (calc) (ref 6–22)
BUN: 27 mg/dL — ABNORMAL HIGH (ref 7–25)
CO2: 33 mmol/L — ABNORMAL HIGH (ref 20–32)
Calcium: 9.2 mg/dL (ref 8.6–10.3)
Chloride: 97 mmol/L — ABNORMAL LOW (ref 98–110)
Creat: 1.38 mg/dL — ABNORMAL HIGH (ref 0.70–1.18)
Glucose, Bld: 86 mg/dL (ref 65–99)
Potassium: 4.4 mmol/L (ref 3.5–5.3)
Sodium: 137 mmol/L (ref 135–146)

## 2017-12-13 ENCOUNTER — Other Ambulatory Visit: Payer: Self-pay

## 2017-12-13 DIAGNOSIS — K7030 Alcoholic cirrhosis of liver without ascites: Secondary | ICD-10-CM

## 2017-12-13 NOTE — Progress Notes (Signed)
Forwarding to Martina.  

## 2017-12-15 ENCOUNTER — Encounter: Payer: Self-pay | Admitting: "Endocrinology

## 2017-12-15 ENCOUNTER — Other Ambulatory Visit: Payer: Self-pay

## 2017-12-15 MED ORDER — FREESTYLE LIBRE 14 DAY SENSOR MISC: 1.0000 | 2 refills | Status: DC

## 2017-12-22 ENCOUNTER — Encounter: Payer: Self-pay | Admitting: "Endocrinology

## 2017-12-22 ENCOUNTER — Ambulatory Visit (INDEPENDENT_AMBULATORY_CARE_PROVIDER_SITE_OTHER): Payer: Medicare Other | Admitting: "Endocrinology

## 2017-12-22 VITALS — BP 109/58 | HR 83 | Ht 67.0 in | Wt 244.0 lb

## 2017-12-22 DIAGNOSIS — E1122 Type 2 diabetes mellitus with diabetic chronic kidney disease: Secondary | ICD-10-CM | POA: Diagnosis not present

## 2017-12-22 DIAGNOSIS — E782 Mixed hyperlipidemia: Secondary | ICD-10-CM

## 2017-12-22 DIAGNOSIS — N183 Chronic kidney disease, stage 3 unspecified: Secondary | ICD-10-CM

## 2017-12-22 DIAGNOSIS — I10 Essential (primary) hypertension: Secondary | ICD-10-CM

## 2017-12-22 DIAGNOSIS — Z794 Long term (current) use of insulin: Secondary | ICD-10-CM

## 2017-12-22 NOTE — Progress Notes (Signed)
Endocrinology follow-up note       12/22/2017, 11:39 AM   Subjective:    Patient ID: Jeffrey [name], male    DOB: February 23, 1946.  Jeffrey [name] is being seen in follow-up for management of currently uncontrolled symptomatic diabetes requested by  Monico Blitz, MD.   Past Medical History:  Diagnosis Date  . Alcoholic cirrhosis ([place])    patient reports completing hep a and b vaccines in 2006  . Anemia    has had 3 units of prbcs 2013  . Anxiety   . Asymptomatic gallstones    Ultrasound in 2006  . Cholelithiasis   . Depression   . Diabetes ([place])   . Duodenal ulcer with hemorrhage    per patient in 2006 or 2007, records have been requested  . 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 Hypertension   . Splenomegaly    Ultrasound in 2006  . Tubular adenoma of colon    Past Surgical History:  Procedure Laterality Date  . APPENDECTOMY    . COLONOSCOPY  03/10/2005   Rectal polyp as described above, removed with snare. Left sided  diverticula. The remainder of the colonic mucosa appeared normal. Inflammed polyp on path.  . COLONOSCOPY  04/1999   Dr. Thea Silversmith polyps removed,  Path showed hyperplastic  . COLONOSCOPY  10/2015   Dr. Britta Mccreedy: diverticulosis, single sessile tubular adenoma 3-62mm in size removed from descending colon.   . COLONOSCOPY WITH ESOPHAGOGASTRODUODENOSCOPY (EGD)  03/31/2012   AGT:XMIWOEH AVMs. Colonic diverticulosis. tubular adenoma colon  . ESOPHAGOGASTRODUODENOSCOPY  01/15/2005   Three columns grade 1 to 2 esophageal varices, otherwise normal esophageal mucosa.  Esophagus was not manipulated otherwise./Nodularity of the antrum with overlying erosions, nonspecific finding. Path showed rare H.pylori  . ESOPHAGOGASTRODUODENOSCOPY  09/2008   Dr. Gaylene Brooks columns of grade 2-3 esoph varices, only one column was prominent. Portal gastropathy,  multiple gastrc polyps at antrum, two were 2cm with black eschar, bulbar polyps, bulbar erosions  . ESOPHAGOGASTRODUODENOSCOPY  11/2004   Dr. Brantley Stage 3 esoph varices  . ESOPHAGOGASTRODUODENOSCOPY  03/31/2012   RMR: 4 columns(3-GR 2, 1-GR1) non-bleeding esophageal varices, portal gastropathy, small HH, early GAVE, multiple gastric polyps   . GASTRIC VARICES BANDING  03/31/2012   Procedure: GASTRIC VARICES BANDING;  Surgeon: Daneil Dolin, MD;  Location: [facility];  Service: Endoscopy;  Laterality: N/A;  . UMBILICAL HERNIA REPAIR  2017   [facility]   Social History   Socioeconomic History  . Marital status: Divorced    Spouse name: Not on file  . Number of children: 2  . Years of education: Not on file  . Highest education level: Not on file  Occupational History  . Occupation: Disabled    Employer: SELF EMPLOYED  Social Needs  . Financial resource strain: Not on file  . Food insecurity:    Worry: Not on file    Inability: Not on file  . Transportation needs:    Medical: Not on file    Non-medical: Not on file  Tobacco Use  . Smoking status:  Former Smoker    Packs/day: 0.50    Types: Cigarettes  . Smokeless tobacco: Never Used  . Tobacco comment: qiut about 25+ years  Substance and Sexual Activity  . Alcohol use: No    Comment: quit in 05/2013  . Drug use: No  . Sexual activity: Not Currently  Lifestyle  . Physical activity:    Days per week: Not on file    Minutes per session: Not on file  . Stress: Not on file  Relationships  . Social connections:    Talks on phone: Not on file    Gets together: Not on file    Attends religious service: Not on file    Active member of club or organization: Not on file    Attends meetings of clubs or organizations: Not on file    Relationship status: Not on file  Other Topics Concern  . Not on file  Social History Narrative   Two step children from second marriage (divorced) who live with him along with some  grandchildren.    Outpatient Encounter Medications as of 12/22/2017  Medication Sig  . Insulin Degludec 200 UNIT/ML SOPN Inject 120 Units into the skin at bedtime.  . insulin lispro (HUMALOG) 100 UNIT/ML KiwkPen Inject 30-36 Units into the skin 3 (three) times daily before meals.  . Ascorbic Acid (VITAMIN C) 1000 MG tablet Take 1,000 mg by mouth 2 (two) times daily.  . Continuous Blood Gluc Sensor (FREESTYLE LIBRE 14 DAY SENSOR) MISC Inject 1 each into the skin every 14 (fourteen) days. Use as directed.  . gabapentin (NEURONTIN) 100 MG capsule Take 100 mg by mouth 3 (three) times daily.  . Garlic 3559 MG CAPS Take 1 capsule by mouth daily.   . iron polysaccharides (NIFEREX) 150 MG capsule Take 150 mg by mouth daily.  [name] lactulose (CONSTULOSE) 10 GM/15ML solution Take 30 g by mouth 3 (three) times daily.   . metFORMIN (GLUCOPHAGE) 500 MG tablet Take 1 tablet (500 mg total) by mouth 2 (two) times daily with a meal.  . milk thistle 175 MG tablet Take 175 mg by mouth daily.  . mirtazapine (REMERON) 15 MG tablet Take 15 mg by mouth at bedtime.  . Multiple Vitamin (MULTIVITAMIN) capsule Take 1 capsule by mouth daily.  . Omega 3 1000 MG CAPS Take by mouth 2 (two) times daily.  [name] omeprazole (PRILOSEC) 20 MG capsule TAKE 1 CAPSULE BY MOUTH EVERY DAY (Patient taking differently: TAKE 1 CAPSULE BY MOUTH EVERY DAY 40 mg)  . pravastatin (PRAVACHOL) 20 MG tablet Take 20 mg by mouth daily.  . propranolol (INDERAL) 40 MG tablet TAKE 1 TABLET BY MOUTH TWICE DAILY  . rifaximin (XIFAXAN) 550 MG TABS tablet Take 550 mg by mouth 3 (three) times daily.  . sitaGLIPtin (JANUVIA) 100 MG tablet Take 50 mg by mouth daily.  [name] spironolactone (ALDACTONE) 100 MG tablet Take 100 mg by mouth daily.   . tamsulosin (FLOMAX) 0.4 MG CAPS capsule Take 0.4 mg by mouth.  . thiamine 100 MG tablet Take 100 mg by mouth daily.  [name] tiZANidine (ZANAFLEX) 2 MG tablet Take by mouth every 6 (six) hours as needed for muscle spasms.  [name]  torsemide (DEMADEX) 20 MG tablet Take 40 mg by mouth 2 (two) times daily.  . Turmeric 500 MG TABS Take by mouth daily.  [name] venlafaxine (EFFEXOR) 75 MG tablet Take 75 mg by mouth 3 (three) times daily with meals.   . vitamin E 400 UNIT capsule Take 400  Units by mouth daily.  . [DISCONTINUED] insulin degludec (TRESIBA FLEXTOUCH) 100 UNIT/ML SOPN FlexTouch Pen Inject 100 Units into the skin at bedtime.  . [DISCONTINUED] insulin lispro (HUMALOG KWIKPEN) 100 UNIT/ML KiwkPen Inject 0.25-0.31 mLs (25-31 Units total) into the skin 3 (three) times daily before meals.   No facility-administered encounter medications on file as of 12/22/2017.     ALLERGIES: No Known Allergies  VACCINATION STATUS:  There is no immunization history on file for this patient.  Diabetes  He presents for his follow-up diabetic visit. He has type 2 diabetes mellitus. Onset time: He was diagnosed at approximate age of 37 years. His disease course has been worsening. Pertinent negatives for hypoglycemia include no confusion, headaches, pallor or seizures. Associated symptoms include polydipsia and polyuria. Pertinent negatives for diabetes include no chest pain, no fatigue, no polyphagia and no weakness. There are no hypoglycemic complications. Symptoms are worsening. Diabetic complications include nephropathy. Risk factors for coronary artery disease include male sex, obesity, hypertension, dyslipidemia, family history and diabetes mellitus. Current diabetic treatments: He is taking Humulin 25 units nightly, Tresiba 100 units every morning, Humalog 30 units with breakfast, 30 units with lunch, 50 units with dinner.  He is also on metformin 1000 mg p.o. BID, Januvia 100 mg once a day. He is compliant with treatment most of the time. His weight is increasing steadily. He is following a generally unhealthy diet. When asked about meal planning, he reported none. He has had a previous visit with a dietitian (Patient is working with a  Microbiologist in [place].). His home blood glucose trend is increasing steadily. His breakfast blood glucose range is generally >200 mg/dl. His lunch blood glucose range is generally >200 mg/dl. His dinner blood glucose range is generally >200 mg/dl. His bedtime blood glucose range is generally >200 mg/dl. His overall blood glucose range is >200 mg/dl. An ACE inhibitor/angiotensin II receptor blocker is being taken. Eye exam is current.  Hypertension  This is a chronic problem. The current episode started more than 1 year ago. The problem is uncontrolled. Pertinent negatives include no chest pain, headaches, neck pain, palpitations or shortness of breath. Risk factors for coronary artery disease include diabetes mellitus, dyslipidemia, obesity, male gender, sedentary lifestyle and smoking/tobacco exposure. Hypertensive end-organ damage includes kidney disease.    Review of Systems  Constitutional: Negative for chills, fatigue, fever and unexpected weight change.  HENT: Negative for dental problem, mouth sores and trouble swallowing.   Eyes: Negative for visual disturbance.  Respiratory: Negative for cough, choking, chest tightness, shortness of breath and wheezing.   Cardiovascular: Negative for chest pain, palpitations and leg swelling.  Gastrointestinal: Negative for abdominal distention, abdominal pain, constipation, diarrhea, nausea and vomiting.  Endocrine: Positive for polydipsia and polyuria. Negative for polyphagia.  Genitourinary: Negative for dysuria, flank pain, hematuria and urgency.  Musculoskeletal: Negative for back pain, gait problem, myalgias and neck pain.  Skin: Negative for pallor, rash and wound.  Neurological: Negative for seizures, syncope, weakness, numbness and headaches.  Psychiatric/Behavioral: Negative.  Negative for confusion and dysphoric mood.    Objective:    BP (!) 109/58   Pulse 83   Ht 5\' 7"  (1.702 m)   Wt 244 lb (110.7 kg)   BMI 38.22 kg/m   Wt  Readings from Last 3 Encounters:  12/22/17 244 lb (110.7 kg)  11/25/17 239 lb 9.6 oz (108.7 kg)  11/24/17 242 lb (109.8 kg)     Physical Exam  Constitutional: He is oriented to person,  place, and time. He appears well-developed. He is cooperative. No distress.  HENT:  Head: Normocephalic and atraumatic.  Eyes: EOM are normal.  Neck: Normal range of motion. Neck supple. No tracheal deviation present. No thyromegaly present.  Cardiovascular: Normal rate, S1 normal and S2 normal. Exam reveals no gallop.  No murmur heard. Pulses:      Dorsalis pedis pulses are 1+ on the right side, and 1+ on the left side.       Posterior tibial pulses are 1+ on the right side, and 1+ on the left side.  Pulmonary/Chest: Effort normal. No respiratory distress. He has no wheezes.  Abdominal: He exhibits no distension. There is no tenderness. There is no guarding and no CVA tenderness.  Musculoskeletal: He exhibits no edema.       Right shoulder: He exhibits no swelling and no deformity.  Neurological: He is alert and oriented to person, place, and time. He has normal strength and normal reflexes. No cranial nerve deficit or sensory deficit. Gait normal.  Skin: Skin is warm and dry. No rash noted. No cyanosis. Nails show no clubbing.  Psychiatric: He has a normal mood and affect. His speech is normal. Judgment normal. Cognition and memory are normal.    CMP ( most recent) CMP     Component Value Date/Time   NA 137 12/10/2017 0858   K 4.4 12/10/2017 0858   CL 97 (L) 12/10/2017 0858   CO2 33 (H) 12/10/2017 0858   GLUCOSE 86 12/10/2017 0858   BUN 27 (H) 12/10/2017 0858   CREATININE 1.38 (H) 12/10/2017 0858   CALCIUM 9.2 12/10/2017 0858   PROT 6.7 11/30/2017 0922   ALBUMIN 3.1 (L) 04/07/2016 0719   AST 27 11/30/2017 0922   ALT 21 11/30/2017 0922   ALKPHOS 324 (H) 04/07/2016 0719   BILITOT 0.7 11/30/2017 0922   GFRNONAA 39 (L) 11/30/2017 0922   GFRAA 45 (L) 11/30/2017 0922     Diabetic Labs (most  recent): Lab Results  Component Value Date   HGBA1C 9.1 (H) 10/20/2017   HGBA1C 5.3 01/10/2016   HGBA1C 7.5 (A) 02/25/2012   Lipid Panel     Component Value Date/Time   CHOL 112 02/25/2012 1034   TRIG 110 02/25/2012 1034     Assessment & Plan:   1. Type 2 diabetes mellitus with stage 3 chronic kidney disease, with long-term current use of insulin (HCC)  - Jeffrey [name] has currently uncontrolled symptomatic type 2 DM since 72 years of age. -He returns with persistently above target glycemic profile both fasting and postprandial, despite the large dose of basal/bolus insulin. -  This is largely due to his indiscretion to processed carbohydrates including cakes, soda.    His most recent labs show A1c of 9.1% consistent with uncontrolled diabetes. Recent labs reviewed.  -his diabetes is complicated by renal insufficiency, obesity/sedentary life, history of smoking and Jeffrey [name] remains at a high risk for more acute and chronic complications which include CAD, CVA, CKD, retinopathy, and neuropathy. These are all discussed in detail with the patient.  - I have counseled him on diet management and weight loss, by adopting a carbohydrate restricted/protein rich diet.  -  Suggestion is made for him to avoid simple carbohydrates  from his diet including Cakes, Sweet Desserts / Pastries, Ice Cream, Soda (diet and regular), Sweet Tea, Candies, Chips, Cookies, Store Bought Juices, Alcohol in Excess of  1-2 drinks a day, Artificial Sweeteners, and "Sugar-free" Products. This will help patient to  have stable blood glucose profile and potentially avoid unintended weight gain.   - I encouraged him to switch to  unprocessed or minimally processed complex starch and increased protein intake (animal or plant source), fruits, and vegetables.  - he is advised to stick to a routine mealtimes to eat 3 meals  a day and avoid unnecessary snacks ( to snack only to correct hypoglycemia).   - he has been   scheduled with Jeffrey [name], RDN, CDE for individualized diabetes education.  - I have approached him with the following individualized plan to manage diabetes and patient agrees:   -Based on his presentation with significantly above target glycemic profile, he will continue to require higher dose of insulin in order for him to achieve control of diabetes. -He is a good candidate for insulin U500.  However, he has enough supplies of [place] for another 4 weeks. -However, he has a large supply of Tresiba and Humalog.  He will be allowed to use up these resources before the switch. -I advised him to increase his [place] Aas to 120  units at bedtime, increase his Humalog to 30 units  3 times a day with meals  for pre-meal BG readings of 90-150mg /dl, plus patient specific correction dose for unexpected hyperglycemia above 150mg /dl, associated with strict monitoring of glucose 4 times a day-before meals and at bedtime. - Patient is warned not to take insulin without proper monitoring per orders. -Adjustment parameters are given for hypo and hyperglycemia in writing. -Patient is encouraged to call clinic for blood glucose levels less than 70 or above 300 mg /dl. - I advised him to continue  Januvia 50 mg p.o. every morning with breakfast, therapeutically suitable for patient . -He is advised to continue low-dose metformin 500 mg p.o. twice daily after breakfast and supper.    -Patient is not a candidate for SGLT2 inhibitors due to CKD. -He will be switched to insulin U500 on his next visit.  He is advised to bring back all of his leftover insulin at his next visit.   - Patient specific target  A1c;  LDL, HDL, Triglycerides, and  Waist Circumference were discussed in detail.  2) BP/HTN: His blood pressure is not controlled to target.  He is advised to continue his current blood pressure medications including propranolol 40 mg p.o. daily, Spironolactone 100 mg p.o. daily, torsemide 40 mg p.o. twice  daily.  3) Lipids/HPL:   His lipid panel from December 2018 showed LDL at 80.   He is advised to continue pravastatin 20 mg p.o. nightly.    4)  Weight/Diet: He continues to gain weight.  CDE Consult has been initiated , exercise, and detailed carbohydrates information provided.  5) Chronic Care/Health Maintenance:  -he  is on  Statin medications and  is encouraged to continue to follow up with Ophthalmology, Dentist,  Podiatrist at least yearly or according to recommendations, and advised to  stay away from smoking. I have recommended yearly flu vaccine and pneumonia vaccination at least every 5 years; moderate intensity exercise for up to 150 minutes weekly; and  sleep for at least 7 hours a day.  - I advised patient to maintain close follow up with Monico Blitz, MD for primary care needs.  - Time spent with the patient: 25 min, of which >50% was spent in reviewing his blood glucose logs , discussing his hypo- and hyper-glycemic episodes, reviewing his current and  previous labs and insulin doses and developing a plan to avoid hypo- and hyper-glycemia.  Please refer to Patient Instructions for Blood Glucose Monitoring and Insulin/Medications Dosing Guide"  in media tab for additional information. Jeffrey [name] participated in the discussions, expressed understanding, and voiced agreement with the above plans.  All questions were answered to his satisfaction. he is encouraged to contact clinic should he have any questions or concerns prior to his return visit.   Follow up plan: - Return in about 2 months (around 03/02/2018) for follow up with pre-visit labs, meter, and logs.  Glade Lloyd, MD [facility] [address] [place], [place] [address] Phone: [phone]  Fax: [phone]    12/22/2017, 11:39 AM  This note was partially dictated with voice recognition software. Similar sounding words can be transcribed inadequately or may not  be  corrected upon review.

## 2017-12-22 NOTE — Progress Notes (Signed)
ON RECALL  °

## 2017-12-22 NOTE — Patient Instructions (Signed)

## 2017-12-24 ENCOUNTER — Ambulatory Visit: Payer: Medicare Other | Admitting: Gastroenterology

## 2018-01-11 ENCOUNTER — Ambulatory Visit: Payer: Medicare Other | Admitting: Gastroenterology

## 2018-01-19 ENCOUNTER — Other Ambulatory Visit: Payer: Self-pay | Admitting: "Endocrinology

## 2018-01-19 ENCOUNTER — Ambulatory Visit (HOSPITAL_COMMUNITY): Admit: 2018-01-19 | Payer: Medicare Other | Admitting: Internal Medicine

## 2018-01-19 ENCOUNTER — Encounter (HOSPITAL_COMMUNITY): Payer: Self-pay

## 2018-01-19 SURGERY — EGD (ESOPHAGOGASTRODUODENOSCOPY)
Anesthesia: Moderate Sedation

## 2018-01-25 NOTE — Patient Instructions (Signed)
Jeffrey [name]  01/25/2018     @PREFPERIOPPHARMACY @   Your procedure is scheduled on  02/03/2018  Report to [facility] at  830   A.M.  Call this number if you have problems the morning of surgery:  [phone]   Remember:  Do not eat or drink after midnight.  You may drink clear liquids until ( follow the instructions given tom you) .  Clear liquids allowed are:                    Water, Juice (non-citric and without pulp), Carbonated beverages, Clear Tea, Black Coffee only, Plain Jell-O only, Gatorade and Plain Popsicles only    Take these medicines the morning of surgery with A SIP OF WATER  Gabapentin, omeprazole, propranolol, aldactone, tianidine, venlafaxine.    Do not wear jewelry, make-up or nail polish.  Do not wear lotions, powders, or perfumes, or deodorant.  Do not shave 48 hours prior to surgery.  Men may shave face and neck.  Do not bring valuables to the hospital.  [facility] is not responsible for any belongings or valuables.  Contacts, dentures or bridgework may not be worn into surgery.  Leave your suitcase in the car.  After surgery it may be brought to your room.  For patients admitted to the hospital, discharge time will be determined by your treatment team.  Patients discharged the day of surgery will not be allowed to drive home.   Name and phone number of your driver:   family Special instructions:  Follow the diet instructions given to you.  Please read over the following fact sheets that you were given. Anesthesia Post-op Instructions and Care and Recovery After Surgery       Esophagogastroduodenoscopy Esophagogastroduodenoscopy (EGD) is a procedure to examine the lining of the esophagus, stomach, and first part of the small intestine (duodenum). This procedure is done to check for problems such as inflammation, bleeding, ulcers, or growths. During this procedure, a long, flexible, lighted tube with a camera attached (endoscope) is  inserted down the throat. Tell a health care provider about:  Any allergies you have.  All medicines you are taking, including vitamins, herbs, eye drops, creams, and over-the-counter medicines.  Any problems you or family members have had with anesthetic medicines.  Any blood disorders you have.  Any surgeries you have had.  Any medical conditions you have.  Whether you are pregnant or may be pregnant. What are the risks? Generally, this is a safe procedure. However, problems may occur, including:  Infection.  Bleeding.  A tear (perforation) in the esophagus, stomach, or duodenum.  Trouble breathing.  Excessive sweating.  Spasms of the larynx.  A slowed heartbeat.  Low blood pressure.  What happens before the procedure?  Follow instructions from your health care provider about eating or drinking restrictions.  Ask your health care provider about: ? Changing or stopping your regular medicines. This is especially important if you are taking diabetes medicines or blood thinners. ? Taking medicines such as aspirin and ibuprofen. These medicines can thin your blood. Do not take these medicines before your procedure if your health care provider instructs you not to.  Plan to have someone take you home after the procedure.  If you wear dentures, be ready to remove them before the procedure. What happens during the procedure?  To reduce your risk of infection, your health care team will wash or sanitize  their hands.  An IV tube will be put in a vein in your hand or arm. You will get medicines and fluids through this tube.  You will be given one or more of the following: ? A medicine to help you relax (sedative). ? A medicine to numb the area (local anesthetic). This medicine may be sprayed into your throat. It will make you feel more comfortable and keep you from gagging or coughing during the procedure. ? A medicine for pain.  A mouth guard may be placed in your  mouth to protect your teeth and to keep you from biting on the endoscope.  You will be asked to lie on your left side.  The endoscope will be lowered down your throat into your esophagus, stomach, and duodenum.  Air will be put into the endoscope. This will help your health care provider see better.  The lining of your esophagus, stomach, and duodenum will be examined.  Your health care provider may: ? Take a tissue sample so it can be looked at in a lab (biopsy). ? Remove growths. ? Remove objects (foreign bodies) that are stuck. ? Treat any bleeding with medicines or other devices that stop tissue from bleeding. ? Widen (dilate) or stretch narrowed areas of your esophagus and stomach.  The endoscope will be taken out. The procedure may vary among health care providers and hospitals. What happens after the procedure?  Your blood pressure, heart rate, breathing rate, and blood oxygen level will be monitored often until the medicines you were given have worn off.  Do not eat or drink anything until the numbing medicine has worn off and your gag reflex has returned. This information is not intended to replace advice given to you by your health care provider. Make sure you discuss any questions you have with your health care provider. Document Released: 09/18/2004 Document Revised: 10/24/2015 Document Reviewed: 04/11/2015 Elsevier Interactive Patient Education  2018 [employer]. Esophagogastroduodenoscopy, Care After Refer to this sheet in the next few weeks. These instructions provide you with information about caring for yourself after your procedure. Your health care provider may also give you more specific instructions. Your treatment has been planned according to current medical practices, but problems sometimes occur. Call your health care provider if you have any problems or questions after your procedure. What can I expect after the procedure? After the procedure, it is common to  have:  A sore throat.  Nausea.  Bloating.  Dizziness.  Fatigue.  Follow these instructions at home:  Do not eat or drink anything until the numbing medicine (local anesthetic) has worn off and your gag reflex has returned. You will know that the local anesthetic has worn off when you can swallow comfortably.  Do not drive for 24 hours if you received a medicine to help you relax (sedative).  If your health care provider took a tissue sample for testing during the procedure, make sure to get your test results. This is your responsibility. Ask your health care provider or the department performing the test when your results will be ready.  Keep all follow-up visits as told by your health care provider. This is important. Contact a health care provider if:  You cannot stop coughing.  You are not urinating.  You are urinating less than usual. Get help right away if:  You have trouble swallowing.  You cannot eat or drink.  You have throat or chest pain that gets worse.  You are dizzy or light-headed.  You faint.  You have nausea or vomiting.  You have chills.  You have a fever.  You have severe abdominal pain.  You have black, tarry, or bloody stools. This information is not intended to replace advice given to you by your health care provider. Make sure you discuss any questions you have with your health care provider. Document Released: 05/04/2012 Document Revised: 10/24/2015 Document Reviewed: 04/11/2015 Elsevier Interactive Patient Education  2018 [employer] Anesthesia is a term that refers to techniques, procedures, and medicines that help a person stay safe and comfortable during a medical procedure. Monitored anesthesia care, or sedation, is one type of anesthesia. Your anesthesia specialist may recommend sedation if you will be having a procedure that does not require you to be unconscious, such as:  Cataract surgery.  A  dental procedure.  A biopsy.  A colonoscopy.  During the procedure, you may receive a medicine to help you relax (sedative). There are three levels of sedation:  Mild sedation. At this level, you may feel awake and relaxed. You will be able to follow directions.  Moderate sedation. At this level, you will be sleepy. You may not remember the procedure.  Deep sedation. At this level, you will be asleep. You will not remember the procedure.  The more medicine you are given, the deeper your level of sedation will be. Depending on how you respond to the procedure, the anesthesia specialist may change your level of sedation or the type of anesthesia to fit your needs. An anesthesia specialist will monitor you closely during the procedure. Let your health care provider know about:  Any allergies you have.  All medicines you are taking, including vitamins, herbs, eye drops, creams, and over-the-counter medicines.  Any use of steroids (by mouth or as a cream).  Any problems you or family members have had with sedatives and anesthetic medicines.  Any blood disorders you have.  Any surgeries you have had.  Any medical conditions you have, such as sleep apnea.  Whether you are pregnant or may be pregnant.  Any use of cigarettes, alcohol, or street drugs. What are the risks? Generally, this is a safe procedure. However, problems may occur, including:  Getting too much medicine (oversedation).  Nausea.  Allergic reaction to medicines.  Trouble breathing. If this happens, a breathing tube may be used to help with breathing. It will be removed when you are awake and breathing on your own.  Heart trouble.  Lung trouble.  Before the procedure Staying hydrated Follow instructions from your health care provider about hydration, which may include:  Up to 2 hours before the procedure - you may continue to drink clear liquids, such as water, clear fruit juice, black coffee, and plain  tea.  Eating and drinking restrictions Follow instructions from your health care provider about eating and drinking, which may include:  8 hours before the procedure - stop eating heavy meals or foods such as meat, fried foods, or fatty foods.  6 hours before the procedure - stop eating light meals or foods, such as toast or cereal.  6 hours before the procedure - stop drinking milk or drinks that contain milk.  2 hours before the procedure - stop drinking clear liquids.  Medicines Ask your health care provider about:  Changing or stopping your regular medicines. This is especially important if you are taking diabetes medicines or blood thinners.  Taking medicines such as aspirin and ibuprofen. These medicines can thin your blood.  Do not take these medicines before your procedure if your health care provider instructs you not to.  Tests and exams  You will have a physical exam.  You may have blood tests done to show: ? How well your kidneys and liver are working. ? How well your blood can clot.  General instructions  Plan to have someone take you home from the hospital or clinic.  If you will be going home right after the procedure, plan to have someone with you for 24 hours.  What happens during the procedure?  Your blood pressure, heart rate, breathing, level of pain and overall condition will be monitored.  An IV tube will be inserted into one of your veins.  Your anesthesia specialist will give you medicines as needed to keep you comfortable during the procedure. This may mean changing the level of sedation.  The procedure will be performed. After the procedure  Your blood pressure, heart rate, breathing rate, and blood oxygen level will be monitored until the medicines you were given have worn off.  Do not drive for 24 hours if you received a sedative.  You may: ? Feel sleepy, clumsy, or nauseous. ? Feel forgetful about what happened after the  procedure. ? Have a sore throat if you had a breathing tube during the procedure. ? Vomit. This information is not intended to replace advice given to you by your health care provider. Make sure you discuss any questions you have with your health care provider. Document Released: 02/11/2005 Document Revised: 10/25/2015 Document Reviewed: 09/08/2015 Elsevier Interactive Patient Education  2018 [place], Care After These instructions provide you with information about caring for yourself after your procedure. Your health care provider may also give you more specific instructions. Your treatment has been planned according to current medical practices, but problems sometimes occur. Call your health care provider if you have any problems or questions after your procedure. What can I expect after the procedure? After your procedure, it is common to:  Feel sleepy for several hours.  Feel clumsy and have poor balance for several hours.  Feel forgetful about what happened after the procedure.  Have poor judgment for several hours.  Feel nauseous or vomit.  Have a sore throat if you had a breathing tube during the procedure.  Follow these instructions at home: For at least 24 hours after the procedure:   Do not: ? Participate in activities in which you could fall or become injured. ? Drive. ? Use heavy machinery. ? Drink alcohol. ? Take sleeping pills or medicines that cause drowsiness. ? Make important decisions or sign legal documents. ? Take care of children on your own.  Rest. Eating and drinking  Follow the diet that is recommended by your health care provider.  If you vomit, drink water, juice, or soup when you can drink without vomiting.  Make sure you have little or no nausea before eating solid foods. General instructions  Have a responsible adult stay with you until you are awake and alert.  Take over-the-counter and prescription  medicines only as told by your health care provider.  If you smoke, do not smoke without supervision.  Keep all follow-up visits as told by your health care provider. This is important. Contact a health care provider if:  You keep feeling nauseous or you keep vomiting.  You feel light-headed.  You develop a rash.  You have a fever. Get help right away if:  You have trouble breathing.  This information is not intended to replace advice given to you by your health care provider. Make sure you discuss any questions you have with your health care provider. Document Released: 09/08/2015 Document Revised: 01/08/2016 Document Reviewed: 09/08/2015 Elsevier Interactive Patient Education  [employer].

## 2018-01-28 ENCOUNTER — Other Ambulatory Visit: Payer: Self-pay

## 2018-01-28 ENCOUNTER — Encounter (HOSPITAL_COMMUNITY)
Admission: RE | Admit: 2018-01-28 | Discharge: 2018-01-28 | Disposition: A | Discharge status: Home / Self Care | Payer: Medicare Other | Source: Ambulatory Visit | Attending: Internal Medicine | Admitting: Internal Medicine

## 2018-01-28 ENCOUNTER — Encounter (HOSPITAL_COMMUNITY): Payer: Self-pay

## 2018-01-28 DIAGNOSIS — Z01810 Encounter for preprocedural cardiovascular examination: Secondary | ICD-10-CM | POA: Insufficient documentation

## 2018-01-28 DIAGNOSIS — Z01812 Encounter for preprocedural laboratory examination: Secondary | ICD-10-CM | POA: Insufficient documentation

## 2018-01-28 HISTORY — DX: Unspecified hearing loss, unspecified ear: H91.90

## 2018-01-28 LAB — CBC WITH DIFFERENTIAL/PLATELET
Basophils Absolute: 0.1 10*3/uL (ref 0.0–0.1)
Basophils Relative: 2 %
Eosinophils Absolute: 0.2 10*3/uL (ref 0.0–0.7)
Eosinophils Relative: 7 %
HCT: 28 % — ABNORMAL LOW (ref 39.0–52.0)
Hemoglobin: 8.1 g/dL — ABNORMAL LOW (ref 13.0–17.0)
Lymphocytes Relative: 14 %
Lymphs Abs: 0.4 10*3/uL — ABNORMAL LOW (ref 0.7–4.0)
MCH: 25.6 pg — ABNORMAL LOW (ref 26.0–34.0)
MCHC: 28.9 g/dL — ABNORMAL LOW (ref 30.0–36.0)
MCV: 88.6 fL (ref 78.0–100.0)
Monocytes Absolute: 0.4 10*3/uL (ref 0.1–1.0)
Monocytes Relative: 11 %
Neutro Abs: 2.2 10*3/uL (ref 1.7–7.7)
Neutrophils Relative %: 66 %
Platelets: 96 10*3/uL — ABNORMAL LOW (ref 150–400)
RBC: 3.16 MIL/uL — ABNORMAL LOW (ref 4.22–5.81)
RDW: 17.1 % — ABNORMAL HIGH (ref 11.5–15.5)
WBC: 3.3 10*3/uL — ABNORMAL LOW (ref 4.0–10.5)

## 2018-01-28 LAB — BASIC METABOLIC PANEL
Anion gap: 12 (ref 5–15)
BUN: 54 mg/dL — ABNORMAL HIGH (ref 8–23)
CO2: 23 mmol/L (ref 22–32)
Calcium: 8.9 mg/dL (ref 8.9–10.3)
Chloride: 98 mmol/L (ref 98–111)
Creatinine, Ser: 1.56 mg/dL — ABNORMAL HIGH (ref 0.61–1.24)
GFR calc Af Amer: 50 mL/min — ABNORMAL LOW (ref >60)
GFR calc non Af Amer: 43 mL/min — ABNORMAL LOW (ref >60)
Glucose, Bld: 186 mg/dL — ABNORMAL HIGH (ref 70–99)
Potassium: 3.8 mmol/L (ref 3.5–5.1)
Sodium: 133 mmol/L — ABNORMAL LOW (ref 135–145)

## 2018-01-28 NOTE — Progress Notes (Signed)
   01/28/18 1022  OBSTRUCTIVE SLEEP APNEA  Have you ever been diagnosed with sleep apnea through a sleep study? No  Do you snore loudly (loud enough to be heard through closed doors)?  0  Do you often feel tired, fatigued, or sleepy during the daytime (such as falling asleep during driving or talking to someone)? 0  Has anyone observed you stop breathing during your sleep? 0  Do you have, or are you being treated for high blood pressure? 1  BMI more than 35 kg/m2? 1  Age > 50 (1-yes) 1  Neck circumference greater than:Male 16 inches or larger, Male 17inches or larger? 1  Male Gender (Yes=1) 1  Obstructive Sleep Apnea Score 5  Score 5 or greater  Results sent to PCP

## 2018-02-02 ENCOUNTER — Telehealth: Payer: Self-pay | Admitting: Internal Medicine

## 2018-02-02 NOTE — Telephone Encounter (Signed)
As far as the torsemide, I don't typically manage patients fluid with that medication therefore will defer to prescribing providers advice. He has had a lot of dose changes since I saw him back in July and his creatinine continues to climb.Labs sent to PCP.  I would have him get further management of this from PCP.   He should keep ov here next month.   He should stick to 2 gram sodium diet.   For EGD,  The only insulin on his list was the bedtime dose (insulin degludec) which he would tonight and then the sliding scale before meals, take today but hold tomorrow morning unless his sugar is high and warrants it.

## 2018-02-02 NOTE — Telephone Encounter (Signed)
Spoke with pts spouse. Pt has gained 8 pounds since his last apt 01/24/18. Pt is taking Torsemide 20 mg 3 times daily and pts pcp Dr. Brigitte Pulse wants him to increase Torsemide 20 mg to 4 times daily. Dr. Brigitte Pulse wanted to your thoughts on increasing medication to 4 times daily per pts spouse. Pt also had a cortisone injection in his shoulder yesterday and they were told it can raise his diabetic levels. Pts spouse wants to know if pt should take insulin prior to his EGD tomorrow? Please advise.

## 2018-02-02 NOTE — Telephone Encounter (Signed)
Noted. Pts spouse was notified. Pt will keep his upcoming appointment, follow care with his pcp, take his nightly medication  and only do insulin in the morning if his sugar is elevated.

## 2018-02-02 NOTE — Telephone Encounter (Signed)
Pt's wife called with multiple questions about his torsemide 20 mg and spironlace 100 mg. She said that patient has gained 8 pounds. Also, has question if patient should take his insulin shot today and tomorrow since he is scheduled for a colonoscopy tomorrow. She can be reached at [phone]

## 2018-02-02 NOTE — Telephone Encounter (Signed)
Communication noted.  

## 2018-02-03 ENCOUNTER — Ambulatory Visit (HOSPITAL_COMMUNITY)
Admission: RE | Admit: 2018-02-03 | Discharge: 2018-02-03 | Disposition: A | Discharge status: Home / Self Care | Payer: Medicare Other | Source: Ambulatory Visit | Attending: Internal Medicine | Admitting: Internal Medicine

## 2018-02-03 ENCOUNTER — Ambulatory Visit (HOSPITAL_COMMUNITY): Payer: Medicare Other | Admitting: Anesthesiology

## 2018-02-03 ENCOUNTER — Encounter (HOSPITAL_COMMUNITY): Payer: Self-pay | Admitting: *Deleted

## 2018-02-03 ENCOUNTER — Encounter (HOSPITAL_COMMUNITY)
Admission: RE | Disposition: A | Discharge status: Home / Self Care | Payer: Self-pay | Source: Ambulatory Visit | Attending: Internal Medicine

## 2018-02-03 DIAGNOSIS — Z800 Family history of malignant neoplasm of digestive organs: Secondary | ICD-10-CM | POA: Insufficient documentation

## 2018-02-03 DIAGNOSIS — Z8711 Personal history of peptic ulcer disease: Secondary | ICD-10-CM | POA: Diagnosis not present

## 2018-02-03 DIAGNOSIS — F329 Major depressive disorder, single episode, unspecified: Secondary | ICD-10-CM | POA: Insufficient documentation

## 2018-02-03 DIAGNOSIS — E119 Type 2 diabetes mellitus without complications: Secondary | ICD-10-CM | POA: Insufficient documentation

## 2018-02-03 DIAGNOSIS — I8500 Esophageal varices without bleeding: Secondary | ICD-10-CM | POA: Insufficient documentation

## 2018-02-03 DIAGNOSIS — Z8719 Personal history of other diseases of the digestive system: Secondary | ICD-10-CM | POA: Diagnosis not present

## 2018-02-03 DIAGNOSIS — Z87891 Personal history of nicotine dependence: Secondary | ICD-10-CM | POA: Diagnosis not present

## 2018-02-03 DIAGNOSIS — Z86010 Personal history of colonic polyps: Secondary | ICD-10-CM | POA: Diagnosis not present

## 2018-02-03 DIAGNOSIS — F419 Anxiety disorder, unspecified: Secondary | ICD-10-CM | POA: Insufficient documentation

## 2018-02-03 DIAGNOSIS — Z794 Long term (current) use of insulin: Secondary | ICD-10-CM | POA: Insufficient documentation

## 2018-02-03 DIAGNOSIS — K7030 Alcoholic cirrhosis of liver without ascites: Secondary | ICD-10-CM

## 2018-02-03 DIAGNOSIS — Z13810 Encounter for screening for upper gastrointestinal disorder: Secondary | ICD-10-CM | POA: Diagnosis present

## 2018-02-03 DIAGNOSIS — Z79899 Other long term (current) drug therapy: Secondary | ICD-10-CM | POA: Insufficient documentation

## 2018-02-03 DIAGNOSIS — E1140 Type 2 diabetes mellitus with diabetic neuropathy, unspecified: Secondary | ICD-10-CM | POA: Diagnosis not present

## 2018-02-03 DIAGNOSIS — K766 Portal hypertension: Secondary | ICD-10-CM | POA: Insufficient documentation

## 2018-02-03 DIAGNOSIS — I10 Essential (primary) hypertension: Secondary | ICD-10-CM | POA: Insufficient documentation

## 2018-02-03 DIAGNOSIS — Z803 Family history of malignant neoplasm of breast: Secondary | ICD-10-CM | POA: Diagnosis not present

## 2018-02-03 DIAGNOSIS — K317 Polyp of stomach and duodenum: Secondary | ICD-10-CM | POA: Diagnosis not present

## 2018-02-03 HISTORY — PX: ESOPHAGEAL BANDING: SHX5518

## 2018-02-03 HISTORY — PX: BIOPSY: SHX5522

## 2018-02-03 HISTORY — PX: ESOPHAGOGASTRODUODENOSCOPY (EGD) WITH PROPOFOL: SHX5813

## 2018-02-03 LAB — TYPE AND SCREEN
ABO/RH(D): O POS
Antibody Screen: NEGATIVE

## 2018-02-03 LAB — GLUCOSE, CAPILLARY
Glucose-Capillary: 81 mg/dL (ref 70–99)
Glucose-Capillary: 85 mg/dL (ref 70–99)
Glucose-Capillary: 104 mg/dL — ABNORMAL HIGH (ref 70–99)
Glucose-Capillary: 75 mg/dL (ref 70–99)

## 2018-02-03 SURGERY — ESOPHAGOGASTRODUODENOSCOPY (EGD) WITH PROPOFOL
Anesthesia: Monitor Anesthesia Care

## 2018-02-03 MED ORDER — CHLORHEXIDINE GLUCONATE CLOTH 2 % EX PADS: 6.0000 | MEDICATED_PAD | Freq: Once | CUTANEOUS | Status: DC

## 2018-02-03 MED ORDER — LACTATED RINGERS IV SOLN
INTRAVENOUS | Status: DC
  Administered 2018-02-03: 1000 mL via INTRAVENOUS

## 2018-02-03 MED ORDER — PROMETHAZINE HCL 25 MG/ML IJ SOLN: 6.2500 mg | INTRAMUSCULAR | Status: DC | PRN

## 2018-02-03 MED ORDER — DEXTROSE 50 % IV SOLN
INTRAVENOUS | Status: AC
  Filled 2018-02-03: qty 50

## 2018-02-03 MED ORDER — PROPOFOL 500 MG/50ML IV EMUL
INTRAVENOUS | Status: DC | PRN
  Administered 2018-02-03: 150 ug/kg/min via INTRAVENOUS

## 2018-02-03 MED ORDER — HYDROMORPHONE HCL 1 MG/ML IJ SOLN: 0.2500 mg | INTRAMUSCULAR | Status: DC | PRN

## 2018-02-03 MED ORDER — MEPERIDINE HCL 100 MG/ML IJ SOLN: 6.2500 mg | INTRAMUSCULAR | Status: DC | PRN

## 2018-02-03 MED ORDER — PROPOFOL 10 MG/ML IV BOLUS
INTRAVENOUS | Status: AC
  Filled 2018-02-03: qty 40

## 2018-02-03 MED ORDER — HYDROCODONE-ACETAMINOPHEN 7.5-325 MG PO TABS: 1.0000 | ORAL_TABLET | Freq: Once | ORAL | Status: DC | PRN

## 2018-02-03 MED ORDER — DEXTROSE 50 % IV SOLN
12.5000 g | Freq: Once | INTRAVENOUS | Status: AC
  Administered 2018-02-03: 12.5 g via INTRAVENOUS

## 2018-02-03 MED ORDER — STERILE WATER FOR IRRIGATION IR SOLN
Status: DC | PRN
  Administered 2018-02-03: 100 mL

## 2018-02-03 MED ORDER — LACTATED RINGERS IV SOLN: INTRAVENOUS | Status: DC

## 2018-02-03 MED ORDER — LIDOCAINE HCL 1 % IJ SOLN
INTRAMUSCULAR | Status: DC | PRN
  Administered 2018-02-03: 30 mg via INTRADERMAL

## 2018-02-03 NOTE — Op Note (Signed)
[facility] Patient Name: Jeffrey [name] Procedure Date: 02/03/2018 10:25 AM MRN: [id number] Date of Birth: 1946-02-13 Attending MD: Norvel Richards , MD CSN: [id number] Age: 72 Admit Type: Outpatient Procedure:                Upper GI endoscopy Indications:              Screening procedure Providers:                Norvel Richards, MD, Rosina Lowenstein, RN, Randa Spike, Technician Referring MD:              Medicines:                Monitored Anesthesia Care Complications:            No immediate complications. Estimated Blood Loss:     Estimated blood loss was minimal. Procedure:                Pre-Anesthesia Assessment:                           - Prior to the procedure, a History and Physical                            was performed, and patient medications and                            allergies were reviewed. The patient's tolerance of                            previous anesthesia was also reviewed. The risks                            and benefits of the procedure and the sedation                            options and risks were discussed with the patient.                            All questions were answered, and informed consent                            was obtained. Prior Anticoagulants: The patient has                            taken no previous anticoagulant or antiplatelet                            agents. ASA Grade Assessment: II - A patient with                            mild systemic disease. After reviewing the risks  and benefits, the patient was deemed in                            satisfactory condition to undergo the procedure.                           After obtaining informed consent, the endoscope was                            passed under direct vision. Throughout the                            procedure, the patient's blood pressure, pulse, and                            oxygen  saturations were monitored continuously. The                            [id number]-[id number] ([id number]) scope was introduced through the                            and advanced to the second part of duodenum. The                            upper GI endoscopy was accomplished without                            difficulty. The patient tolerated the procedure                            well. 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 3 columns of 5-6 cm grade 1-2       varices without bleeding stigmata present.      Portal hypertensive gastropathy was found in the entire examined       stomach. multiple hyperplastic appearing antral/prepyloric polyps. One       particularly large one approximately 1.5 cm in dimensions. It was       biopsied but not removed.      The duodenal bulb and second portion of the duodenum were normal. Impression:               - Esophageal varices.                           - Portal hypertensive gastropathy. Multiple gastric                            polyps?"biopsied                           - Normal duodenal bulb and second portion of the                            duodenum.                           -  Moderate Sedation:      Moderate (conscious) sedation was personally administered by an       anesthesia professional. The following parameters were monitored: oxygen       saturation, heart rate, blood pressure, respiratory rate, EKG, adequacy       of pulmonary ventilation, and response to care. Total physician       intraservice time was 13 minutes. Recommendation:           - Patient has a contact number available for                            emergencies. The signs and symptoms of potential                            delayed complications were discussed with the                            patient. Return to normal activities tomorrow.                             Written discharge instructions were provided to the                            patient.                           - Advance diet as tolerated. begin out along 40 mg                            daily. Office visit in in 2 weeks for pulse and BP                            check. office visit in 3 months. Pthologu follow-up. Procedure Code(s):        --- Professional ---                           [phone], Esophagogastroduodenoscopy, flexible,                            transoral; diagnostic, including collection of                            specimen(s) by brushing or washing, when performed                            (separate procedure) Diagnosis Code(s):        --- Professional ---                           I85.00, Esophageal varices without bleeding                           K76.6, Portal hypertension                           K31.89, Other diseases of stomach  and duodenum                           Z13.810, Encounter for screening for upper                            gastrointestinal disorder CPT copyright 2017 American [employer]. All rights reserved. The codes documented in this report are preliminary and upon coder review may  be revised to meet current compliance requirements. [name]. Jearldean Gutt, MD Norvel Richards, MD 02/03/2018 11:21:41 AM This report has been signed electronically. Number of Addenda: 0

## 2018-02-03 NOTE — Anesthesia Preprocedure Evaluation (Signed)
Anesthesia Evaluation  Patient identified by MRN, date of birth, ID band Patient awake    Reviewed: Allergy & Precautions, H&P , NPO status , Patient's Chart, lab work & pertinent test results, reviewed documented beta blocker date and time   Airway Mallampati: II  TM Distance: >3 FB Neck ROM: full    Dental no notable dental hx. (+) Upper Dentures, Lower Dentures   Pulmonary neg pulmonary ROS, former smoker,    Pulmonary exam normal breath sounds clear to auscultation       Cardiovascular Exercise Tolerance: Good hypertension, negative cardio ROS   Rhythm:regular Rate:Normal     Neuro/Psych PSYCHIATRIC DISORDERS Anxiety Depression negative neurological ROS  negative psych ROS   GI/Hepatic negative GI ROS, Neg liver ROS, PUD,   Endo/Other  negative endocrine ROSdiabetes, Type 2  Renal/GU Renal diseasenegative Renal ROS  negative genitourinary   Musculoskeletal   Abdominal   Peds  Hematology negative hematology ROS (+) anemia ,   Anesthesia Other Findings NSR at 73, 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   Dental Advisory Given  Plan Discussed with: CRNA and Anesthesiologist  Anesthesia Plan Comments:         Anesthesia Quick Evaluation

## 2018-02-03 NOTE — Discharge Instructions (Signed)
EGD Discharge instructions Please read the instructions outlined below and refer to this sheet in the next few weeks. These discharge instructions provide you with general information on caring for yourself after you leave the hospital. Your doctor may also give you specific instructions. While your treatment has been planned according to the most current medical practices available, unavoidable complications occasionally occur. If you have any problems or questions after discharge, please call your doctor. ACTIVITY  You may resume your regular activity but move at a slower pace for the next 24 hours.   Take frequent rest periods for the next 24 hours.   Walking will help expel (get rid of) the air and reduce the bloated feeling in your abdomen.   No driving for 24 hours (because of the anesthesia (medicine) used during the test).   You may shower.   Do not sign any important legal documents or operate any machinery for 24 hours (because of the anesthesia used during the test).  NUTRITION  Drink plenty of fluids.   You may resume your normal diet.   Begin with a light meal and progress to your normal diet.   Avoid alcoholic beverages for 24 hours or as instructed by your caregiver.  MEDICATIONS  You may resume your normal medications unless your caregiver tells you otherwise.  WHAT YOU CAN EXPECT TODAY  You may experience abdominal discomfort such as a feeling of fullness or gas pains.  FOLLOW-UP  Your doctor will discuss the results of your test with you.  SEEK IMMEDIATE MEDICAL ATTENTION IF ANY OF THE FOLLOWING OCCUR:  Excessive nausea (feeling sick to your stomach) and/or vomiting.   Severe abdominal pain and distention (swelling).   Trouble swallowing.   Temperature over 101 F (37.8 C).   Rectal bleeding or vomiting of blood.    You now have mild esophageal varices  We'll begin nadolol 40 mg daily to decrease te risk of bleeding  Please come by my office in  2 weeks to have your pulse and blood pressure checked  Office visit with [place] in 3 months  Further recommendations to follow pending review of pathology  PATIENT INSTRUCTIONS POST-ANESTHESIA  IMMEDIATELY FOLLOWING SURGERY:  Do not drive or operate machinery for the first twenty four hours after surgery.  Do not make any important decisions for twenty four hours after surgery or while taking narcotic pain medications or sedatives.  If you develop intractable nausea and vomiting or a severe headache please notify your doctor immediately.  FOLLOW-UP:  Please make an appointment with your surgeon as instructed. You do not need to follow up with anesthesia unless specifically instructed to do so.  WOUND CARE INSTRUCTIONS (if applicable):  Keep a dry clean dressing on the anesthesia/puncture wound site if there is drainage.  Once the wound has quit draining you may leave it open to air.  Generally you should leave the bandage intact for twenty four hours unless there is drainage.  If the epidural site drains for more than 36-48 hours please call the anesthesia department.  QUESTIONS?:  Please feel free to call your physician or the hospital operator if you have any questions, and they will be happy to assist you.      Esophageal Varices The esophagus is the passage that connects the throat to the stomach. Esophageal varices are blood vessels in the esophagus that have become enlarged. They develop when extra blood is forced to flow through them because the blood's normal pathway is blocked. Without treatment  these blood vessels eventually break and bleed (hemorrhage). A hemorrhage is life-threatening. What are the causes? This condition may be caused by:  Scarring of the liver (cirrhosis) due to alcoholism. This is the most common cause.  Liver disease.  Severe heart failure.  A blood clot in the portal vein.  Sarcoidosis. This is an inflammatory disease that can affect the  liver.  Schistosomiasis. This is a parasitic infection that can cause liver damage.  What are the signs or symptoms? Usually there are no symptoms unless the esophageal varices bleed. Symptoms of bleeding esophageal varices include:  Vomiting material that is bright red or that is black and looks like coffee grounds.  Coughing up blood.  Black, tarry stools.  Dizziness or lightheadedness.  Low blood pressure.  Loss of consciousness.  How is this diagnosed? This condition is diagnosed with tests, such as:  Endoscopy. During this test a thin, lighted tube is inserted through the mouth and into the esophagus.  Blood tests. These may be done to check liver function, blood counts, and the body's ability to form blood clots.  How is this treated? This condition may be treated with:  Medicines that reduce pressure in the esophageal varices and reduce the risk of bleeding.  Procedures to reduce pressure in the esophageal varices and reduce the risk of bleeding or stop bleeding. These include: ? Variceal ligation. In this procedure, a rubber band is placed around the esophageal varices to keep them from bleeding. ? Injection therapy. This treatment involves an injection that causes the esophageal varices to shrink and close (sclerotherapy). Medicines that tighten blood vessels or alter blood flow may also be used. ? Balloon tamponade. In this procedure, a tube is put into the esophagus and a balloon is passed through it and inflated. ? Transjugular intrahepatic portosystemic shunt (TIPS) placement. In this procedure, a small tube is placed within the liver veins. This decreases blood flow and pressure to the esophageal varices.  A liver transplant. This may be done if other treatments do not work.  Follow these instructions at home:  Take medicines only as directed by your health care provider.  Follow your health care provider's instructions about rest and physical activity. Get  help right away if:  You have any symptoms of this condition after treatment.  You are unable to eat or drink.  You have chest pain. This information is not intended to replace advice given to you by your health care provider. Make sure you discuss any questions you have with your health care provider. Document Released: 08/08/2003 Document Revised: 10/24/2015 Document Reviewed: 05/14/2014 Elsevier Interactive Patient Education  [employer].

## 2018-02-03 NOTE — H&P (Signed)
@LOGO @   Primary Care Physician:  Monico Blitz, MD Primary Gastroenterologist:  Dr. Gala Romney  Pre-Procedure History & Physical: HPI:  Jeffrey [name] is a 72 y.o. male here for with EtOH related cirrhosis here for a screening EGD. No varices on 2017 endoscopy.  Past Medical History:  Diagnosis Date  . Alcoholic cirrhosis ([place])    patient reports completing hep a and b vaccines in 2006  . Anemia    has had 3 units of prbcs 2013  . Anxiety   . Asymptomatic gallstones    Ultrasound in 2006  . Cholelithiasis   . Depression   . Diabetes ([place])   . Duodenal ulcer with hemorrhage    per patient in 2006 or 2007, records have been requested  . 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 HOH (hard of hearing)   . Hypertension   . Splenomegaly    Ultrasound in 2006  . Tubular adenoma of colon     Past Surgical History:  Procedure Laterality Date  . APPENDECTOMY    . CATARACT EXTRACTION Right   . COLONOSCOPY  03/10/2005   Rectal polyp as described above, removed with snare. Left sided  diverticula. The remainder of the colonic mucosa appeared normal. Inflammed polyp on path.  . COLONOSCOPY  04/1999   Dr. Thea Silversmith polyps removed,  Path showed hyperplastic  . COLONOSCOPY  10/2015   Dr. Britta Mccreedy: diverticulosis, single sessile tubular adenoma 3-59mm in size removed from descending colon.   . COLONOSCOPY WITH ESOPHAGOGASTRODUODENOSCOPY (EGD)  03/31/2012   CZY:[id number] AVMs. Colonic diverticulosis. tubular adenoma colon  . ESOPHAGOGASTRODUODENOSCOPY  01/15/2005   Three columns grade 1 to 2 esophageal varices, otherwise normal esophageal mucosa.  Esophagus was not manipulated otherwise./Nodularity of the antrum with overlying erosions, nonspecific finding. Path showed rare H.pylori  . ESOPHAGOGASTRODUODENOSCOPY  09/2008   Dr. Gaylene Brooks columns of grade 2-3 esoph varices, only one column was prominent. Portal gastropathy, multiple gastrc polyps at  antrum, two were 2cm with black eschar, bulbar polyps, bulbar erosions  . ESOPHAGOGASTRODUODENOSCOPY  11/2004   Dr. Brantley Stage 3 esoph varices  . ESOPHAGOGASTRODUODENOSCOPY  03/31/2012   RMR: 4 columns(3-GR 2, 1-GR1) non-bleeding esophageal varices, portal gastropathy, small HH, early GAVE, multiple gastric polyps   . GASTRIC VARICES BANDING  03/31/2012   Procedure: GASTRIC VARICES BANDING;  Surgeon: Daneil Dolin, MD;  Location: [facility];  Service: Endoscopy;  Laterality: N/A;  . UMBILICAL HERNIA REPAIR  2017   [facility]    Prior to Admission medications   Medication Sig Start Date End Date Taking? Authorizing Provider  Ascorbic Acid (VITAMIN C) 1000 MG tablet Take 1,000 mg by mouth 2 (two) times daily.   Yes [provider]  gabapentin (NEURONTIN) 100 MG capsule Take 100 mg by mouth 3 (three) times daily.   Yes [provider]  Garlic 0109 MG CAPS Take 1,000 mg by mouth daily.    Yes [provider]  Insulin Degludec 200 UNIT/ML SOPN Inject 120 Units into the skin at bedtime.   Yes [provider]  insulin lispro (HUMALOG) 100 UNIT/ML KiwkPen Inject 30-36 Units into the skin 3 (three) times daily before meals. Sliding scale insulin   Yes [provider]  iron polysaccharides (NIFEREX) 150 MG capsule Take 150 mg by mouth at bedtime.    Yes [provider]  lactulose (CONSTULOSE) 10 GM/15ML solution Take 30 g by mouth 3 (three) times daily.  Yes [provider]  metFORMIN (GLUCOPHAGE) 500 MG tablet TAKE 1 TABLET BY MOUTH TWICE DAILY with a meal Patient taking differently: Take 500 mg by mouth 2 (two) times daily.  01/19/18  Yes Nida, Marella Chimes, MD  milk thistle 175 MG tablet Take 175 mg by mouth 3 (three) times daily.    Yes [provider]  mirtazapine (REMERON) 15 MG tablet Take 15 mg by mouth at bedtime.   Yes [provider]  Multiple Vitamin (MULTIVITAMIN WITH MINERALS) TABS tablet Take 1  tablet by mouth daily.   Yes [provider]  Omega 3 1000 MG CAPS Take 2,000 mg by mouth 2 (two) times daily.    Yes [provider]  omeprazole (PRILOSEC) 40 MG capsule Take 40 mg by mouth daily.   Yes [provider]  pravastatin (PRAVACHOL) 20 MG tablet Take 20 mg by mouth daily.   Yes [provider]  propranolol (INDERAL) 40 MG tablet TAKE 1 TABLET BY MOUTH TWICE DAILY Patient taking differently: Take 40 mg by mouth 2 (two) times daily.  03/04/16  Yes Mahala Menghini, PA-C  rifaximin (XIFAXAN) 550 MG TABS tablet Take 550 mg by mouth 3 (three) times daily.   Yes [provider]  sitaGLIPtin (JANUVIA) 100 MG tablet Take 50 mg by mouth daily.   Yes [provider]  spironolactone (ALDACTONE) 100 MG tablet Take 100 mg by mouth daily.    Yes [provider]  tamsulosin (FLOMAX) 0.4 MG CAPS capsule Take 0.4 mg by mouth at bedtime.    Yes [provider]  thiamine 100 MG tablet Take 100 mg by mouth daily. Vitamin B-1   Yes [provider]  tiZANidine (ZANAFLEX) 2 MG tablet Take 2 mg by mouth every 6 (six) hours as needed for muscle spasms.    Yes [provider]  torsemide (DEMADEX) 20 MG tablet Take 60 mg by mouth 2 (two) times daily. Morning & lunch   Yes [provider]  triamcinolone cream (KENALOG) 0.1 % Apply 1 application topically 2 (two) times daily as needed for itching. 12/30/17  Yes [provider]  Turmeric 500 MG TABS Take 500 mg by mouth daily.    Yes [provider]  venlafaxine (EFFEXOR) 75 MG tablet Take 75 mg by mouth 3 (three) times daily with meals.    Yes [provider]  vitamin E 400 UNIT capsule Take 400 Units by mouth 2 (two) times daily.    Yes [provider]  Continuous Blood Gluc Sensor (FREESTYLE LIBRE 14 DAY SENSOR) MISC Inject 1 each into the skin every 14 (fourteen) days. Use as directed. 12/15/17   Cassandria Anger, MD     Allergies as of 12/13/2017  . (No Known Allergies)    Family History  Problem Relation Age of Onset  . Colon cancer Father 66       deceased age 83  . Breast cancer Sister        deceased  . Breast cancer Sister   . Stroke Mother   . Lung cancer Neg Hx   . Ovarian cancer Neg Hx     Social History   Socioeconomic History  . Marital status: Married    Spouse name: Not on file  . Number of children: 2  . Years of education: Not on file  . Highest education level: Not on file  Occupational History  . Occupation: Disabled    Employer: SELF EMPLOYED  Social Needs  .  Financial resource strain: Not on file  . Food insecurity:    Worry: Not on file    Inability: Not on file  . Transportation needs:    Medical: Not on file    Non-medical: Not on file  Tobacco Use  . Smoking status: Former Smoker    Packs/day: 0.50    Years: 25.00    Pack years: 12.50    Types: Cigarettes    Last attempt to quit: 01/28/1986    Years since quitting: 32.0  . Smokeless tobacco: Never Used  Substance and Sexual Activity  . Alcohol use: No    Comment: quit in 05/2013  . Drug use: No  . Sexual activity: Not Currently  Lifestyle  . Physical activity:    Days per week: Not on file    Minutes per session: Not on file  . Stress: Not on file  Relationships  . Social connections:    Talks on phone: Not on file    Gets together: Not on file    Attends religious service: Not on file    Active member of club or organization: Not on file    Attends meetings of clubs or organizations: Not on file    Relationship status: Not on file  . Intimate partner violence:    Fear of current or ex partner: Not on file    Emotionally abused: Not on file    Physically abused: Not on file    Forced sexual activity: Not on file  Other Topics Concern  . Not on file  Social History Narrative   Two step children from second marriage (divorced) who live with him along with some grandchildren.     Review  of Systems: See HPI, otherwise negative ROS  Physical Exam: BP 105/63   Pulse 70   Temp 98.1 F (36.7 C) (Oral)   Resp 15   SpO2 93%  General:   Alert,  Well-developed, well-nourished, pleasant and cooperative in NAD Neck:  Supple; no masses or thyromegaly. No significant cervical adenopathy. Lungs:  Clear throughout to auscultation.   No wheezes, crackles, or rhonchi. No acute distress. Heart:  Regular rate and rhythm; no murmurs, clicks, rubs,  or gallops. Abdomen: Non-distended, normal bowel sounds.  Soft and nontender without appreciable mass or hepatosplenomegaly.  Pulses:  Normal pulses noted. Extremities:  Without clubbing or edema.  Impression/Plan:  72 year old gentleman with a history EtOH cirrhosis. Portal gastropathy but no esophageal varices on 2017 EGD. He is here for screening examination. The risks, benefits, limitations, alternatives and imponderables have been reviewed with the patient. Potential for esophageal dilation, biopsy, etc. have also been reviewed.  Questions have been answered. All parties agreeable.     Notice: This dictation was prepared with Dragon dictation along with smaller phrase technology. Any transcriptional errors that result from this process are unintentional and may not be corrected upon review.

## 2018-02-03 NOTE — Transfer of Care (Signed)
Immediate Anesthesia Transfer of Care Note  Patient: Jeffrey [name]  Procedure(s) Performed: ESOPHAGOGASTRODUODENOSCOPY (EGD) WITH PROPOFOL (N/A ) ESOPHAGEAL BANDING (N/A ) BIOPSY  Patient Location: PACU  Anesthesia Type:MAC  Level of Consciousness: awake, alert  and oriented  Airway & Oxygen Therapy: Patient Spontanous Breathing  Post-op Assessment: Report given to RN  Post vital signs: Reviewed and stable  Last Vitals:  Vitals Value Taken Time  BP 88/45 02/03/2018 11:18 AM  Temp    Pulse 66 02/03/2018 11:20 AM  Resp 17 02/03/2018 11:20 AM  SpO2 93 % 02/03/2018 11:20 AM  Vitals shown include unvalidated device data.  Last Pain:  Vitals:   02/03/18 1114  TempSrc:   PainSc: 0-No pain      Patients Stated Pain Goal: 8 (32/35/57 3220)  Complications: No apparent anesthesia complications

## 2018-02-03 NOTE — Anesthesia Postprocedure Evaluation (Signed)
Anesthesia Post Note  Patient: Jeffrey [name]  Procedure(s) Performed: ESOPHAGOGASTRODUODENOSCOPY (EGD) WITH PROPOFOL (N/A ) ESOPHAGEAL BANDING (N/A ) BIOPSY  Patient location during evaluation: PACU Anesthesia Type: MAC Level of consciousness: awake and alert and oriented Pain management: pain level controlled Vital Signs Assessment: post-procedure vital signs reviewed and stable Respiratory status: spontaneous breathing Cardiovascular status: stable Postop Assessment: no apparent nausea or vomiting Anesthetic complications: no     Last Vitals:  Vitals:   02/03/18 1045 02/03/18 1050  BP: 105/63   Pulse:    Resp: 20 15  Temp:    SpO2: 93% 93%    Last Pain:  Vitals:   02/03/18 1114  TempSrc:   PainSc: 0-No pain                 Jahfari Ambers

## 2018-02-06 ENCOUNTER — Encounter: Payer: Self-pay | Admitting: Internal Medicine

## 2018-02-08 ENCOUNTER — Encounter (HOSPITAL_COMMUNITY): Payer: Self-pay | Admitting: Internal Medicine

## 2018-02-16 ENCOUNTER — Telehealth: Payer: Self-pay

## 2018-02-16 NOTE — Telephone Encounter (Signed)
Pt stopped in office today to have his BP rechecked. BP 128/69, P 67, T 97.0, W 230 lb

## 2018-02-16 NOTE — Telephone Encounter (Signed)
Noted. pts spouse notified.

## 2018-02-16 NOTE — Telephone Encounter (Signed)
Numbers look good

## 2018-02-28 ENCOUNTER — Ambulatory Visit: Payer: Medicare Other | Admitting: Gastroenterology

## 2018-03-03 ENCOUNTER — Ambulatory Visit: Payer: Medicare Other | Admitting: "Endocrinology

## 2018-03-04 LAB — COMPLETE METABOLIC PANEL WITH GFR
AG Ratio: 1.3 (calc) (ref 1.0–2.5)
ALT: 30 U/L (ref 9–46)
AST: 29 U/L (ref 10–35)
Albumin: 3.9 g/dL (ref 3.6–5.1)
Alkaline phosphatase (APISO): 93 U/L (ref 40–115)
BUN/Creatinine Ratio: 28 (calc) — ABNORMAL HIGH (ref 6–22)
BUN: 52 mg/dL — ABNORMAL HIGH (ref 7–25)
CO2: 30 mmol/L (ref 20–32)
Calcium: 9.4 mg/dL (ref 8.6–10.3)
Chloride: 97 mmol/L — ABNORMAL LOW (ref 98–110)
Creat: 1.85 mg/dL — ABNORMAL HIGH (ref 0.70–1.18)
GFR, Est African American: 42 mL/min/{1.73_m2} — ABNORMAL LOW (ref >=60)
GFR, Est Non African American: 36 mL/min/{1.73_m2} — ABNORMAL LOW (ref >=60)
Globulin: 3.1 g/dL (calc) (ref 1.9–3.7)
Glucose, Bld: 97 mg/dL (ref 65–139)
Potassium: 4.2 mmol/L (ref 3.5–5.3)
Sodium: 136 mmol/L (ref 135–146)
Total Bilirubin: 0.8 mg/dL (ref 0.2–1.2)
Total Protein: 7 g/dL (ref 6.1–8.1)

## 2018-03-04 LAB — HEMOGLOBIN A1C
Hgb A1c MFr Bld: 6.5 % of total Hgb — ABNORMAL HIGH (ref <5.7)
Mean Plasma Glucose: 140 (calc)
eAG (mmol/L): 7.7 (calc)

## 2018-03-10 ENCOUNTER — Encounter: Payer: Self-pay | Admitting: "Endocrinology

## 2018-03-10 ENCOUNTER — Ambulatory Visit: Payer: Medicare Other | Admitting: Gastroenterology

## 2018-03-10 ENCOUNTER — Ambulatory Visit (INDEPENDENT_AMBULATORY_CARE_PROVIDER_SITE_OTHER): Payer: Medicare Other | Admitting: "Endocrinology

## 2018-03-10 VITALS — BP 133/74 | HR 69 | Ht 67.0 in | Wt 244.0 lb

## 2018-03-10 DIAGNOSIS — I10 Essential (primary) hypertension: Secondary | ICD-10-CM | POA: Diagnosis not present

## 2018-03-10 DIAGNOSIS — E1122 Type 2 diabetes mellitus with diabetic chronic kidney disease: Secondary | ICD-10-CM | POA: Diagnosis not present

## 2018-03-10 DIAGNOSIS — Z794 Long term (current) use of insulin: Secondary | ICD-10-CM

## 2018-03-10 DIAGNOSIS — N183 Chronic kidney disease, stage 3 unspecified: Secondary | ICD-10-CM

## 2018-03-10 DIAGNOSIS — E782 Mixed hyperlipidemia: Secondary | ICD-10-CM

## 2018-03-10 NOTE — Progress Notes (Signed)
Endocrinology follow-up note       03/10/2018, 5:59 PM   Subjective:    Patient ID: Jeffrey [name], male    DOB: Aug 01, 1945.  Jeffrey [name] is being seen in follow-up for management of currently uncontrolled symptomatic type 2 diabetes, hyperlipidemia, hypertension. PMD: Monico Blitz, MD.   Past Medical History:  Diagnosis Date  . Alcoholic cirrhosis ([place])    patient reports completing hep a and b vaccines in 2006  . Anemia    has had 3 units of prbcs 2013  . Anxiety   . Asymptomatic gallstones    Ultrasound in 2006  . Cholelithiasis   . Depression   . Diabetes ([place])   . Duodenal ulcer with hemorrhage    per patient in 2006 or 2007, records have been requested  . 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 HOH (hard of hearing)   . Hypertension   . Splenomegaly    Ultrasound in 2006  . Tubular adenoma of colon    Past Surgical History:  Procedure Laterality Date  . APPENDECTOMY    . BIOPSY  02/03/2018   Procedure: BIOPSY;  Surgeon: Daneil Dolin, MD;  [facility];  Service: Endoscopy;;  bx of gastric polyps  . CATARACT EXTRACTION Right   . COLONOSCOPY  03/10/2005   Rectal polyp as described above, removed with snare. Left sided  diverticula. The remainder of the colonic mucosa appeared normal. Inflammed polyp on path.  . COLONOSCOPY  04/1999   Dr. Thea Silversmith polyps removed,  Path showed hyperplastic  . COLONOSCOPY  10/2015   Dr. Britta Mccreedy: diverticulosis, single sessile tubular adenoma 3-67mm in size removed from descending colon.   . COLONOSCOPY WITH ESOPHAGOGASTRODUODENOSCOPY (EGD)  03/31/2012   [id number] AVMs. Colonic diverticulosis. tubular adenoma colon  . ESOPHAGEAL BANDING N/A 02/03/2018   Procedure: ESOPHAGEAL BANDING;  Surgeon: Daneil Dolin, MD;  [facility];  Service: Endoscopy;  Laterality: N/A;  .  ESOPHAGOGASTRODUODENOSCOPY  01/15/2005   Three columns grade 1 to 2 esophageal varices, otherwise normal esophageal mucosa.  Esophagus was not manipulated otherwise./Nodularity of the antrum with overlying erosions, nonspecific finding. Path showed rare H.pylori  . ESOPHAGOGASTRODUODENOSCOPY  09/2008   Dr. Gaylene Brooks columns of grade 2-3 esoph varices, only one column was prominent. Portal gastropathy, multiple gastrc polyps at antrum, two were 2cm with black eschar, bulbar polyps, bulbar erosions  . ESOPHAGOGASTRODUODENOSCOPY  11/2004   Dr. Brantley Stage 3 esoph varices  . ESOPHAGOGASTRODUODENOSCOPY  03/31/2012   RMR: 4 columns(3-GR 2, 1-GR1) non-bleeding esophageal varices, portal gastropathy, small HH, early GAVE, multiple gastric polyps   . ESOPHAGOGASTRODUODENOSCOPY (EGD) WITH PROPOFOL N/A 02/03/2018   Procedure: ESOPHAGOGASTRODUODENOSCOPY (EGD) WITH PROPOFOL;  Surgeon: Daneil Dolin, MD;  [facility];  Service: Endoscopy;  Laterality: N/A;  10:00am  . GASTRIC VARICES BANDING  03/31/2012   Procedure: GASTRIC VARICES BANDING;  Surgeon: Daneil Dolin, MD;  [facility];  Service: Endoscopy;  Laterality: N/A;  . UMBILICAL HERNIA REPAIR  2017   [facility]   Social History  Socioeconomic History  . Marital status: Married    Spouse name: Not on file  . Number of children: 2  . Years of education: Not on file  . Highest education level: Not on file  Occupational History  . Occupation: Disabled    Employer: SELF EMPLOYED  Social Needs  . Financial resource strain: Not on file  . Food insecurity:    Worry: Not on file    Inability: Not on file  . Transportation needs:    Medical: Not on file    Non-medical: Not on file  Tobacco Use  . Smoking status: Former Smoker    Packs/day: 0.50    Years: 25.00    Pack years: 12.50    Types: Cigarettes    Last attempt to quit: 01/28/1986    Years since quitting: 32.1  . Smokeless tobacco: Never Used  Substance and  Sexual Activity  . Alcohol use: No    Comment: quit in 05/2013  . Drug use: No  . Sexual activity: Not Currently  Lifestyle  . Physical activity:    Days per week: Not on file    Minutes per session: Not on file  . Stress: Not on file  Relationships  . Social connections:    Talks on phone: Not on file    Gets together: Not on file    Attends religious service: Not on file    Active member of club or organization: Not on file    Attends meetings of clubs or organizations: Not on file    Relationship status: Not on file  Other Topics Concern  . Not on file  Social History Narrative   Two step children from second marriage (divorced) who live with him along with some grandchildren.    Outpatient Encounter Medications as of 03/10/2018  Medication Sig  . Ascorbic Acid (VITAMIN C) 1000 MG tablet Take 1,000 mg by mouth 2 (two) times daily.  . Continuous Blood Gluc Sensor (FREESTYLE LIBRE 14 DAY SENSOR) MISC Inject 1 each into the skin every 14 (fourteen) days. Use as directed.  . gabapentin (NEURONTIN) 100 MG capsule Take 100 mg by mouth 3 (three) times daily.  . Garlic 2585 MG CAPS Take 1,000 mg by mouth daily.   . Insulin Degludec 200 UNIT/ML SOPN Inject 120 Units into the skin at bedtime.  . insulin lispro (HUMALOG) 100 UNIT/ML KiwkPen Inject 30-36 Units into the skin 3 (three) times daily before meals. Sliding scale insulin  . iron polysaccharides (NIFEREX) 150 MG capsule Take 150 mg by mouth at bedtime.   [name] lactulose (CONSTULOSE) 10 GM/15ML solution Take 30 g by mouth 3 (three) times daily.   . milk thistle 175 MG tablet Take 175 mg by mouth 3 (three) times daily.   . mirtazapine (REMERON) 15 MG tablet Take 15 mg by mouth at bedtime.  . Multiple Vitamin (MULTIVITAMIN WITH MINERALS) TABS tablet Take 1 tablet by mouth daily.  . nadolol (CORGARD) 40 MG tablet Take 40 mg by mouth daily.  . Omega 3 1000 MG CAPS Take 2,000 mg by mouth 2 (two) times daily.   [name] omeprazole (PRILOSEC) 40  MG capsule Take 40 mg by mouth daily.  . pravastatin (PRAVACHOL) 20 MG tablet Take 20 mg by mouth daily.  . propranolol (INDERAL) 40 MG tablet TAKE 1 TABLET BY MOUTH TWICE DAILY (Patient taking differently: Take 40 mg by mouth 2 (two) times daily. )  . rifaximin (XIFAXAN) 550 MG TABS tablet Take 550 mg by mouth 3 (  three) times daily.  . sitaGLIPtin (JANUVIA) 100 MG tablet Take 50 mg by mouth daily.  [name] spironolactone (ALDACTONE) 100 MG tablet Take 100 mg by mouth daily.   . tamsulosin (FLOMAX) 0.4 MG CAPS capsule Take 0.4 mg by mouth at bedtime.   . thiamine 100 MG tablet Take 100 mg by mouth daily. Vitamin B-1  . tiZANidine (ZANAFLEX) 2 MG tablet Take 2 mg by mouth every 6 (six) hours as needed for muscle spasms.   [name] torsemide (DEMADEX) 20 MG tablet Take 60 mg by mouth 2 (two) times daily. Morning & lunch  . triamcinolone cream (KENALOG) 0.1 % Apply 1 application topically 2 (two) times daily as needed for itching.  . Turmeric 500 MG TABS Take 500 mg by mouth daily.   [name] venlafaxine (EFFEXOR) 75 MG tablet Take 75 mg by mouth 3 (three) times daily with meals.   . vitamin E 400 UNIT capsule Take 400 Units by mouth 2 (two) times daily.   . [DISCONTINUED] metFORMIN (GLUCOPHAGE) 500 MG tablet TAKE 1 TABLET BY MOUTH TWICE DAILY with a meal (Patient taking differently: Take 500 mg by mouth 2 (two) times daily. )   No facility-administered encounter medications on file as of 03/10/2018.     ALLERGIES: No Known Allergies  VACCINATION STATUS:  There is no immunization history on file for this patient.  Diabetes  He presents for his follow-up diabetic visit. He has type 2 diabetes mellitus. Onset time: He was diagnosed at approximate age of 74 years. His disease course has been improving. Pertinent negatives for hypoglycemia include no confusion, headaches, pallor or seizures. Associated symptoms include polydipsia and polyuria. Pertinent negatives for diabetes include no chest pain, no fatigue, no  polyphagia and no weakness. There are no hypoglycemic complications. Symptoms are improving. Diabetic complications include nephropathy. Risk factors for coronary artery disease include male sex, obesity, hypertension, dyslipidemia, family history and diabetes mellitus. Current diabetic treatments: He is currently on Tresiba 120 units nightly, Humalog 30 units 3 times daily AC, Januvia 50 mg once a day, as well as metformin 500 mg p.o. twice daily. He is compliant with treatment most of the time. His weight is stable. He is following a generally unhealthy diet. When asked about meal planning, he reported none. He has had a previous visit with a dietitian (Patient is working with a Microbiologist in [place].). His home blood glucose trend is increasing steadily. His breakfast blood glucose range is generally 180-200 mg/dl. His lunch blood glucose range is generally 180-200 mg/dl. His dinner blood glucose range is generally 180-200 mg/dl. His bedtime blood glucose range is generally 180-200 mg/dl. His overall blood glucose range is 180-200 mg/dl. An ACE inhibitor/angiotensin II receptor blocker is being taken. Eye exam is current.  Hypertension  This is a chronic problem. The current episode started more than 1 year ago. The problem is uncontrolled. Pertinent negatives include no chest pain, headaches, neck pain, palpitations or shortness of breath. Risk factors for coronary artery disease include diabetes mellitus, dyslipidemia, obesity, male gender, sedentary lifestyle and smoking/tobacco exposure. Hypertensive end-organ damage includes kidney disease.    Review of Systems  Constitutional: Negative for chills, fatigue, fever and unexpected weight change.  HENT: Negative for dental problem, mouth sores and trouble swallowing.   Eyes: Negative for visual disturbance.  Respiratory: Negative for cough, choking, chest tightness, shortness of breath and wheezing.   Cardiovascular: Negative for chest pain,  palpitations and leg swelling.  Gastrointestinal: Negative for abdominal distention, abdominal pain,  constipation, diarrhea, nausea and vomiting.  Endocrine: Positive for polydipsia and polyuria. Negative for polyphagia.  Genitourinary: Negative for dysuria, flank pain, hematuria and urgency.  Musculoskeletal: Negative for back pain, gait problem, myalgias and neck pain.  Skin: Negative for pallor, rash and wound.  Neurological: Negative for seizures, syncope, weakness, numbness and headaches.  Psychiatric/Behavioral: Negative for confusion and dysphoric mood.    Objective:    BP 133/74   Pulse 69   Ht 5\' 7"  (1.702 m)   Wt 244 lb (110.7 kg)   BMI 38.22 kg/m   Wt Readings from Last 3 Encounters:  03/10/18 244 lb (110.7 kg)  01/28/18 243 lb (110.2 kg)  12/22/17 244 lb (110.7 kg)     Physical Exam  Constitutional: He is oriented to person, place, and time. He appears well-developed. He is cooperative. No distress.  HENT:  Head: Normocephalic and atraumatic.  Eyes: EOM are normal.  Neck: Normal range of motion. Neck supple. No tracheal deviation present. No thyromegaly present.  Cardiovascular: Normal rate, S1 normal and S2 normal. Exam reveals no gallop.  No murmur heard. Pulses:      Dorsalis pedis pulses are 1+ on the right side, and 1+ on the left side.       Posterior tibial pulses are 1+ on the right side, and 1+ on the left side.  Pulmonary/Chest: Effort normal. No respiratory distress. He has no wheezes.  Abdominal: He exhibits no distension. There is no tenderness. There is no guarding and no CVA tenderness.  Musculoskeletal: He exhibits no edema.       Right shoulder: He exhibits no swelling and no deformity.  Neurological: He is alert and oriented to person, place, and time. He has normal strength and normal reflexes. No cranial nerve deficit or sensory deficit. Gait normal.  Skin: Skin is warm and dry. No rash noted. No cyanosis. Nails show no clubbing.  Psychiatric:  He has a normal mood and affect. His speech is normal. Judgment normal. Cognition and memory are normal.    CMP ( most recent) CMP     Component Value Date/Time   NA 136 03/03/2018 0936   K 4.2 03/03/2018 0936   CL 97 (L) 03/03/2018 0936   CO2 30 03/03/2018 0936   GLUCOSE 97 03/03/2018 0936   BUN 52 (H) 03/03/2018 0936   CREATININE 1.85 (H) 03/03/2018 0936   CALCIUM 9.4 03/03/2018 0936   PROT 7.0 03/03/2018 0936   ALBUMIN 3.1 (L) 04/07/2016 0719   AST 29 03/03/2018 0936   ALT 30 03/03/2018 0936   ALKPHOS 324 (H) 04/07/2016 0719   BILITOT 0.8 03/03/2018 0936   GFRNONAA 36 (L) 03/03/2018 0936   GFRAA 42 (L) 03/03/2018 0936     Diabetic Labs (most recent): Lab Results  Component Value Date   HGBA1C 6.5 (H) 03/03/2018   HGBA1C 9.1 (H) 10/20/2017   HGBA1C 5.3 01/10/2016   Lipid Panel     Component Value Date/Time   CHOL 112 02/25/2012 1034   TRIG 110 02/25/2012 1034     Assessment & Plan:   1. Type 2 diabetes mellitus with stage 3 chronic kidney disease, with long-term current use of insulin (HCC)  - Jeffrey [name] has currently uncontrolled symptomatic type 2 DM since 72 years of age. -He returns with significantly improved glycemic profile and A1c of 6.5%, improving from 9.1%.    -No documented no reported hypoglycemia.  Recent labs are reviewed with him.   -his diabetes is complicated by renal insufficiency,  obesity/sedentary life, history of smoking and Jeffrey [name] remains at a high risk for more acute and chronic complications which include CAD, CVA, CKD, retinopathy, and neuropathy. These are all discussed in detail with the patient.  - I have counseled him on diet management and weight loss, by adopting a carbohydrate restricted/protein rich diet. -He still admits to dietary indiscretions including consumption of sweetened beverages. -  Suggestion is made for him to avoid simple carbohydrates  from his diet including Cakes, Sweet Desserts / Pastries, Ice Cream,  Soda (diet and regular), Sweet Tea, Candies, Chips, Cookies, Store Bought Juices, Alcohol in Excess of  1-2 drinks a day, Artificial Sweeteners, and "Sugar-free" Products. This will help patient to have stable blood glucose profile and potentially avoid unintended weight gain.  - I encouraged him to switch to  unprocessed or minimally processed complex starch and increased protein intake (animal or plant source), fruits, and vegetables.  - he is advised to stick to a routine mealtimes to eat 3 meals  a day and avoid unnecessary snacks ( to snack only to correct hypoglycemia).   - I have approached him with the following individualized plan to manage diabetes and patient agrees:   -Based on his presentation with significantly improved glycemic profile with A1c of 6.5%, he is clear for planned elective surgery.    -He is advised to continue Tresiba 120 units nightly, Humalog 30 units  3 times a day with meals  for pre-meal BG readings of 90-150mg /dl, plus patient specific correction dose for unexpected hyperglycemia above 150mg /dl, associated with strict monitoring of glucose 4 times a day-before meals and at bedtime.    -Adjustment parameters are given for hypo and hyperglycemia in writing. -Patient is encouraged to call clinic for blood glucose levels less than 70 or above 300 mg /dl.  -If he continues to require greater than 200 units of insulin U100, he will be considered for insulin U500 during his next visit. - I advised him to continue  Januvia 50 mg p.o. every morning with breakfast, therapeutically suitable for patient . -Due to abnormal renal function, and absence of improvement, he is advised to discontinue metformin at this time.    -Patient is not a candidate for SGLT2 inhibitors due to CKD.  - Patient specific target  A1c;  LDL, HDL, Triglycerides, and  Waist Circumference were discussed in detail.  2) BP/HTN: His blood pressure is controlled to target.    He is advised to  continue his current blood pressure medications including propranolol 40 mg p.o. daily, Spironolactone 100 mg p.o. daily, torsemide 40 mg p.o. twice daily.  3) Lipids/HPL:   His lipid panel from December 2018 showed LDL at 80.  He is advised to continue pravastatin 20 mg p.o. nightly.  Side effects and precautions discussed with him.    4)  Weight/Diet: He has no success on weight control.    CDE Consult has been initiated , exercise, and detailed carbohydrates information provided.  5) Chronic Care/Health Maintenance:  -he  is on  Statin medications and  is encouraged to continue to follow up with Ophthalmology, Dentist,  Podiatrist at least yearly or according to recommendations, and advised to  stay away from smoking. I have recommended yearly flu vaccine and pneumonia vaccination at least every 5 years; moderate intensity exercise for up to 150 minutes weekly; and  sleep for at least 7 hours a day.  - I advised patient to maintain close follow up with Monico Blitz, MD for  primary care needs.  - Time spent with the patient: 25 min, of which >50% was spent in reviewing his blood glucose logs , discussing his hypo- and hyper-glycemic episodes, reviewing his current and  previous labs and insulin doses and developing a plan to avoid hypo- and hyper-glycemia. Please refer to Patient Instructions for Blood Glucose Monitoring and Insulin/Medications Dosing Guide"  in media tab for additional information. Pollie Friar participated in the discussions, expressed understanding, and voiced agreement with the above plans.  All questions were answered to his satisfaction. he is encouraged to contact clinic should he have any questions or concerns prior to his return visit.   Follow up plan: - Return in about 3 months (around 06/10/2018) for Follow up with Pre-visit Labs, Meter, and Logs.  Glade Lloyd, MD [facility] [address] [place], [place]  [address] Phone: [phone]  Fax: [phone]    03/10/2018, 5:59 PM  This note was partially dictated with voice recognition software. Similar sounding words can be transcribed inadequately or may not  be corrected upon review.

## 2018-03-10 NOTE — Patient Instructions (Signed)

## 2018-03-17 ENCOUNTER — Ambulatory Visit (INDEPENDENT_AMBULATORY_CARE_PROVIDER_SITE_OTHER): Payer: Medicare Other | Admitting: Otolaryngology

## 2018-03-17 DIAGNOSIS — H903 Sensorineural hearing loss, bilateral: Secondary | ICD-10-CM | POA: Diagnosis not present

## 2018-03-17 DIAGNOSIS — H9313 Tinnitus, bilateral: Secondary | ICD-10-CM

## 2018-05-06 ENCOUNTER — Ambulatory Visit: Payer: Medicare Other | Admitting: Gastroenterology

## 2018-05-06 ENCOUNTER — Encounter: Payer: Self-pay | Admitting: Gastroenterology

## 2018-05-06 ENCOUNTER — Telehealth: Payer: Self-pay | Admitting: Internal Medicine

## 2018-05-06 VITALS — BP 116/69 | HR 74 | Temp 96.8°F | Ht 67.0 in | Wt 247.8 lb

## 2018-05-06 DIAGNOSIS — K7030 Alcoholic cirrhosis of liver without ascites: Secondary | ICD-10-CM

## 2018-05-06 NOTE — Progress Notes (Signed)
Primary Care Physician: Monico Blitz, MD  Primary Gastroenterologist:  Garfield Cornea, MD   Chief Complaint  Patient presents with  . Cirrhosis    pp f/u    HPI: Jeffrey [name] is a 72 y.o. male here for follow-up of alcohol-related cirrhosis.  No alcohol since 2014.  We saw him back in June 2019 to reestablish care after we had not seen him for several years.  See note from November 25, 2017 for interim events.  He does have a history of hepatic encephalopathy during hospitalization/postoperatively for incarcerated umbilical hernia and pneumatosis intestinalis.  He was last seen at time of EGD back in September, 3 columns of grade 1-2 varices, portal hypertensive gastropathy, multiple hyperplastic appearing gastric polyps, confirmed with biopsy.  According to postop records, he was started on nadolol 40 mg daily.  Patient was on propranolol prior to EGD. Patient did not bring updated medication list today, he will have his wife call later with current medications and dosages.  He did come by the office 2 weeks after his procedure for blood pressure and pulse check to see how he was doing on nadolol so I assume he is still on the medication.    Patient presents with his sister today.  She assists with driving him to his appointments.  Clinically patient states he feels fine.  He has had some mild lower extremity edema but this is been stable.  He denies any abdominal pain.  He continues to work, self-employed.  Having bowel movement to 3 times per day, takes lactulose 40 cc 1-2 times daily as needed.  No melena or rectal bleeding.  No abdominal pain.  No heartburn.  Appetite is good.  He is due for labs next month as well as right upper quadrant ultrasound.  He has had some issues with elevated creatinine since he established care with [place] back in May.  He has chronic stable anemia as well.  Current Outpatient Medications  Medication Sig Dispense Refill  . Ascorbic Acid (VITAMIN C) 1000 MG  tablet Take 1,000 mg by mouth 2 (two) times daily.    . Continuous Blood Gluc Sensor (FREESTYLE LIBRE 14 DAY SENSOR) MISC Inject 1 each into the skin every 14 (fourteen) days. Use as directed. 2 each 2  . gabapentin (NEURONTIN) 100 MG capsule Take 100 mg by mouth 3 (three) times daily.    . Garlic 8657 MG CAPS Take 1,000 mg by mouth daily.     . Insulin Degludec 200 UNIT/ML SOPN Inject 120 Units into the skin at bedtime.    . insulin lispro (HUMALOG) 100 UNIT/ML KiwkPen Inject 30-36 Units into the skin 3 (three) times daily before meals. Sliding scale insulin    . iron polysaccharides (NIFEREX) 150 MG capsule Take 150 mg by mouth at bedtime.     [name] lactulose (CONSTULOSE) 10 GM/15ML solution Take 30 g by mouth 3 (three) times daily. Taking 64ml bid    . milk thistle 175 MG tablet Take 175 mg by mouth 3 (three) times daily.     . mirtazapine (REMERON) 15 MG tablet Take 15 mg by mouth at bedtime.    . Multiple Vitamin (MULTIVITAMIN WITH MINERALS) TABS tablet Take 1 tablet by mouth daily.    . nadolol (CORGARD) 40 MG tablet Take 40 mg by mouth daily.  5  . Omega 3 1000 MG CAPS Take 2,000 mg by mouth 2 (two) times daily.     [name] omeprazole (PRILOSEC) 40 MG  capsule Take 40 mg by mouth daily.    . pravastatin (PRAVACHOL) 20 MG tablet Take 20 mg by mouth daily.    . propranolol (INDERAL) 40 MG tablet TAKE 1 TABLET BY MOUTH TWICE DAILY (Patient taking differently: Take 40 mg by mouth 2 (two) times daily. ) 60 tablet 1  . rifaximin (XIFAXAN) 550 MG TABS tablet Take 550 mg by mouth 3 (three) times daily.    . sitaGLIPtin (JANUVIA) 100 MG tablet Take 50 mg by mouth daily.    [name] spironolactone (ALDACTONE) 100 MG tablet Take 100 mg by mouth daily.     . tamsulosin (FLOMAX) 0.4 MG CAPS capsule Take 0.4 mg by mouth at bedtime.     . thiamine 100 MG tablet Take 100 mg by mouth daily. Vitamin B-1    . tiZANidine (ZANAFLEX) 2 MG tablet Take 2 mg by mouth every 6 (six) hours as needed for muscle spasms.     [name]  torsemide (DEMADEX) 20 MG tablet Take 60 mg by mouth 2 (two) times daily. Morning & lunch    . triamcinolone cream (KENALOG) 0.1 % Apply 1 application topically 2 (two) times daily as needed for itching.  3  . Turmeric 500 MG TABS Take 500 mg by mouth daily.     [name] venlafaxine (EFFEXOR) 75 MG tablet Take 75 mg by mouth 3 (three) times daily with meals.     . vitamin E 400 UNIT capsule Take 400 Units by mouth 2 (two) times daily.      No current facility-administered medications for this visit.     Allergies as of 05/06/2018  . (No Known Allergies)    ROS:  General: Negative for anorexia, weight loss, fever, chills, fatigue, weakness. ENT: Negative for hoarseness, difficulty swallowing , nasal congestion. CV: Negative for chest pain, angina, palpitations, dyspnea on exertion, positive peripheral edema.  Respiratory: Negative for dyspnea at rest, dyspnea on exertion, cough, sputum, wheezing.  GI: See history of present illness. GU:  Negative for dysuria, hematuria, urinary incontinence, urinary frequency, nocturnal urination.  Endo: Negative for unusual weight change.    Physical Examination:   BP 116/69   Pulse 74   Temp (!) 96.8 F (36 C) (Oral)   Ht 5\' 7"  (1.702 m)   Wt 247 lb 12.8 oz (112.4 kg)   BMI 38.81 kg/m   General: Well-nourished, well-developed in no acute distress.  Eyes: No icterus. Mouth: Oropharyngeal mucosa moist and pink , no lesions erythema or exudate. Lungs: Clear to auscultation bilaterally.  Heart: Regular rate and rhythm, no murmurs rubs or gallops.  Abdomen: Bowel sounds are normal, nontender, nondistended, no hepatosplenomegaly or masses, no abdominal bruits or hernia , no rebound or guarding.   Extremities: 1+ lower extremity edema. No clubbing or deformities. Neuro: Alert and oriented x 4   Skin: Warm and dry, no jaundice.   Psych: Alert and cooperative, normal mood and affect.  Labs:  Lab Results  Component Value Date   CREATININE 1.85 (H)  03/03/2018   BUN 52 (H) 03/03/2018   NA 136 03/03/2018   K 4.2 03/03/2018   CL 97 (L) 03/03/2018   CO2 30 03/03/2018   Lab Results  Component Value Date   ALT 30 03/03/2018   AST 29 03/03/2018   ALKPHOS 324 (H) 04/07/2016   BILITOT 0.8 03/03/2018   Lab Results  Component Value Date   WBC 3.3 (L) 01/28/2018   HGB 8.1 (L) 01/28/2018   HCT 28.0 (L) 01/28/2018   MCV  88.6 01/28/2018   PLT 96 (L) 01/28/2018   Lab Results  Component Value Date   INR 1.0 11/30/2017   INR 1.22 03/28/2016   INR 1.15 01/10/2016   Lab Results  Component Value Date   HGBA1C 6.5 (H) 03/03/2018     Imaging Studies: No results found.

## 2018-05-06 NOTE — Telephone Encounter (Signed)
U/S scheduled for 06/16/18 at 8:30am, arrival time 8:15am, npo after midnight. Letter mailed with information. Patient spouse aware

## 2018-05-06 NOTE — Patient Instructions (Signed)
1. Please have your wife call and verify your medications with my nurse.  2. Please have your labs done in 06/2018, you can wait until your appointment with Dr. Dorris Fetch but make sure you take our lab orders when you go the the lab. 3. Ultrasound of your liver in 06/2018.  4. Return office visit in six months or call sooner if needed.

## 2018-05-06 NOTE — Assessment & Plan Note (Addendum)
Clinically stable.  Due for updated labs and right upper quadrant ultrasound next month.  He will have his wife call with a list of his current medications.  He is supposed to be on a nonselective beta-blocker given history of varices, we had propranolol and nadolol on his medication list. Need to confirm and make adjustments if he is on BOTH. Continue etoh abstinence, he has been sober over 5 years. Return to the office in six months or sooner if needed.   Consider repeat EGD in 2 years based on current guideline.   He is immune to hepatitis A. He reports vaccinations for Hep B but his labs show he is NOT immune.

## 2018-05-06 NOTE — Addendum Note (Signed)
Addended by: Inge Rise on: 05/06/2018 10:58 AM   Modules accepted: Orders

## 2018-05-06 NOTE — Telephone Encounter (Signed)
PATIENT SUPPOSED TO HAVE A ULTRASOUND 06/2018  SCREEN HEPATOMA

## 2018-05-09 ENCOUNTER — Encounter (INDEPENDENT_AMBULATORY_CARE_PROVIDER_SITE_OTHER): Payer: Medicare Other | Admitting: Ophthalmology

## 2018-05-09 DIAGNOSIS — E113293 Type 2 diabetes mellitus with mild nonproliferative diabetic retinopathy without macular edema, bilateral: Secondary | ICD-10-CM

## 2018-05-09 DIAGNOSIS — H43813 Vitreous degeneration, bilateral: Secondary | ICD-10-CM

## 2018-05-09 DIAGNOSIS — H35033 Hypertensive retinopathy, bilateral: Secondary | ICD-10-CM

## 2018-05-09 DIAGNOSIS — E11319 Type 2 diabetes mellitus with unspecified diabetic retinopathy without macular edema: Secondary | ICD-10-CM | POA: Diagnosis not present

## 2018-05-09 DIAGNOSIS — H2512 Age-related nuclear cataract, left eye: Secondary | ICD-10-CM

## 2018-05-09 DIAGNOSIS — I10 Essential (primary) hypertension: Secondary | ICD-10-CM

## 2018-05-09 DIAGNOSIS — H353121 Nonexudative age-related macular degeneration, left eye, early dry stage: Secondary | ICD-10-CM

## 2018-05-09 NOTE — Progress Notes (Signed)
cc'd to pcp 

## 2018-06-03 LAB — CBC WITH DIFFERENTIAL/PLATELET
Absolute Monocytes: 235 cells/uL (ref 200–950)
Basophils Absolute: 41 cells/uL (ref 0–200)
Basophils Relative: 1.8 %
Eosinophils Absolute: 175 cells/uL (ref 15–500)
Eosinophils Relative: 7.6 %
HCT: 31.7 % — ABNORMAL LOW (ref 38.5–50.0)
Hemoglobin: 10 g/dL — ABNORMAL LOW (ref 13.2–17.1)
Lymphs Abs: 276 cells/uL — ABNORMAL LOW (ref 850–3900)
MCH: 29.9 pg (ref 27.0–33.0)
MCHC: 31.5 g/dL — ABNORMAL LOW (ref 32.0–36.0)
MCV: 94.9 fL (ref 80.0–100.0)
MPV: 11.1 fL (ref 7.5–12.5)
Monocytes Relative: 10.2 %
Neutro Abs: 1573 cells/uL (ref 1500–7800)
Neutrophils Relative %: 68.4 %
Platelets: 94 10*3/uL — ABNORMAL LOW (ref 140–400)
RBC: 3.34 10*6/uL — ABNORMAL LOW (ref 4.20–5.80)
RDW: 16 % — ABNORMAL HIGH (ref 11.0–15.0)
Total Lymphocyte: 12 %
WBC: 2.3 10*3/uL — ABNORMAL LOW (ref 3.8–10.8)

## 2018-06-03 LAB — COMPLETE METABOLIC PANEL WITH GFR
AG Ratio: 1.3 (calc) (ref 1.0–2.5)
ALT: 34 U/L (ref 9–46)
AST: 39 U/L — ABNORMAL HIGH (ref 10–35)
Albumin: 3.8 g/dL (ref 3.6–5.1)
Alkaline phosphatase (APISO): 102 U/L (ref 40–115)
BUN/Creatinine Ratio: 28 (calc) — ABNORMAL HIGH (ref 6–22)
BUN: 46 mg/dL — ABNORMAL HIGH (ref 7–25)
CO2: 31 mmol/L (ref 20–32)
Calcium: 9.2 mg/dL (ref 8.6–10.3)
Chloride: 100 mmol/L (ref 98–110)
Creat: 1.66 mg/dL — ABNORMAL HIGH (ref 0.70–1.18)
GFR, Est African American: 47 mL/min/{1.73_m2} — ABNORMAL LOW (ref >=60)
GFR, Est Non African American: 41 mL/min/{1.73_m2} — ABNORMAL LOW (ref >=60)
Globulin: 3 g/dL (calc) (ref 1.9–3.7)
Glucose, Bld: 113 mg/dL (ref 65–139)
Potassium: 3.8 mmol/L (ref 3.5–5.3)
Sodium: 139 mmol/L (ref 135–146)
Total Bilirubin: 0.7 mg/dL (ref 0.2–1.2)
Total Protein: 6.8 g/dL (ref 6.1–8.1)

## 2018-06-03 LAB — PROTIME-INR
INR: 1.1
Prothrombin Time: 10.9 s (ref 9.0–11.5)

## 2018-06-03 LAB — HEMOGLOBIN A1C
Hgb A1c MFr Bld: 8.2 % of total Hgb — ABNORMAL HIGH (ref <5.7)
Mean Plasma Glucose: 189 (calc)
eAG (mmol/L): 10.4 (calc)

## 2018-06-03 LAB — AFP TUMOR MARKER: AFP-Tumor Marker: 1.4 ng/mL (ref <6.1)

## 2018-06-09 ENCOUNTER — Ambulatory Visit (INDEPENDENT_AMBULATORY_CARE_PROVIDER_SITE_OTHER): Payer: Medicare Other | Admitting: "Endocrinology

## 2018-06-09 ENCOUNTER — Encounter: Payer: Self-pay | Admitting: "Endocrinology

## 2018-06-09 VITALS — BP 130/72 | HR 71 | Ht 67.0 in | Wt 248.0 lb

## 2018-06-09 DIAGNOSIS — N183 Chronic kidney disease, stage 3 unspecified: Secondary | ICD-10-CM

## 2018-06-09 DIAGNOSIS — E782 Mixed hyperlipidemia: Secondary | ICD-10-CM

## 2018-06-09 DIAGNOSIS — I10 Essential (primary) hypertension: Secondary | ICD-10-CM | POA: Diagnosis not present

## 2018-06-09 DIAGNOSIS — E1122 Type 2 diabetes mellitus with diabetic chronic kidney disease: Secondary | ICD-10-CM | POA: Diagnosis not present

## 2018-06-09 DIAGNOSIS — Z794 Long term (current) use of insulin: Secondary | ICD-10-CM

## 2018-06-09 NOTE — Progress Notes (Signed)
Endocrinology follow-up note       06/09/2018, 9:08 AM   Subjective:    Patient ID: Jeffrey [name], male    DOB: 11-28-1945.  Jeffrey [name] is being seen in follow-up for management of currently uncontrolled symptomatic type 2 diabetes, hyperlipidemia, hypertension. PMD: Monico Blitz, MD.   Past Medical History:  Diagnosis Date  . 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 Anemia    has had 3 units of prbcs 2013  . Anxiety   . Asymptomatic gallstones    Ultrasound in 2006  . Cholelithiasis   . Depression   . Diabetes ([place])   . Duodenal ulcer with hemorrhage    per patient in 2006 or 2007, records have been requested  . 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 HOH (hard of hearing)   . Hypertension   . Splenomegaly    Ultrasound in 2006  . Tubular adenoma of colon    Past Surgical History:  Procedure Laterality Date  . APPENDECTOMY    . BIOPSY  02/03/2018   Procedure: BIOPSY;  Surgeon: Daneil Dolin, MD;  [facility];  Service: Endoscopy;;  bx of gastric polyps  . CATARACT EXTRACTION Right   . COLONOSCOPY  03/10/2005   Rectal polyp as described above, removed with snare. Left sided  diverticula. The remainder of the colonic mucosa appeared normal. Inflammed polyp on path.  . COLONOSCOPY  04/1999   Dr. Thea Silversmith polyps removed,  Path showed hyperplastic  . COLONOSCOPY  10/2015   Dr. Britta Mccreedy: diverticulosis, single sessile tubular adenoma 3-92mm in size removed from descending colon.   . COLONOSCOPY WITH ESOPHAGOGASTRODUODENOSCOPY (EGD)  03/31/2012   [id number] AVMs. Colonic diverticulosis. tubular adenoma colon  . ESOPHAGEAL BANDING N/A 02/03/2018   Procedure: ESOPHAGEAL BANDING;  Surgeon: Daneil Dolin, MD;  [facility];  Service: Endoscopy;  Laterality: N/A;  .  ESOPHAGOGASTRODUODENOSCOPY  01/15/2005   Three columns grade 1 to 2 esophageal varices, otherwise normal esophageal mucosa.  Esophagus was not manipulated otherwise./Nodularity of the antrum with overlying erosions, nonspecific finding. Path showed rare H.pylori  . ESOPHAGOGASTRODUODENOSCOPY  09/2008   Dr. Gaylene Brooks columns of grade 2-3 esoph varices, only one column was prominent. Portal gastropathy, multiple gastrc polyps at antrum, two were 2cm with black eschar, bulbar polyps, bulbar erosions  . ESOPHAGOGASTRODUODENOSCOPY  11/2004   Dr. Brantley Stage 3 esoph varices  . ESOPHAGOGASTRODUODENOSCOPY  03/31/2012   RMR: 4 columns(3-GR 2, 1-GR1) non-bleeding esophageal varices, portal gastropathy, small HH, early GAVE, multiple gastric polyps   . ESOPHAGOGASTRODUODENOSCOPY (EGD) WITH PROPOFOL N/A 02/03/2018   Dr. Gala Romney: Esophageal varices, 3 columns grade 1-2.  Portal hypertensive gastropathy.  Multiple gastric polyps, biopsy consistent with hyperplastic.  [name] GASTRIC VARICES BANDING  03/31/2012   Procedure: GASTRIC VARICES BANDING;  Surgeon: Daneil Dolin, MD;  [facility];  Service: Endoscopy;  Laterality: N/A;  . UMBILICAL HERNIA REPAIR  2017   [facility]   Social History   Socioeconomic History  .  Marital status: Married    Spouse name: Not on file  . Number of children: 2  . Years of education: Not on file  . Highest education level: Not on file  Occupational History  . Occupation: Disabled    Employer: SELF EMPLOYED  Social Needs  . Financial resource strain: Not on file  . Food insecurity:    Worry: Not on file    Inability: Not on file  . Transportation needs:    Medical: Not on file    Non-medical: Not on file  Tobacco Use  . Smoking status: Former Smoker    Packs/day: 0.50    Years: 25.00    Pack years: 12.50    Types: Cigarettes    Last attempt to quit: 01/28/1986    Years since quitting: 32.3  . Smokeless tobacco: Never Used  Substance and Sexual Activity   . Alcohol use: No    Comment: quit in 05/2013  . Drug use: No  . Sexual activity: Not Currently  Lifestyle  . Physical activity:    Days per week: Not on file    Minutes per session: Not on file  . Stress: Not on file  Relationships  . Social connections:    Talks on phone: Not on file    Gets together: Not on file    Attends religious service: Not on file    Active member of club or organization: Not on file    Attends meetings of clubs or organizations: Not on file    Relationship status: Not on file  Other Topics Concern  . Not on file  Social History Narrative   Two step children from second marriage (divorced) who live with him along with some grandchildren.    Outpatient Encounter Medications as of 06/09/2018  Medication Sig  . Ascorbic Acid (VITAMIN C) 1000 MG tablet Take 1,000 mg by mouth 2 (two) times daily.  . Continuous Blood Gluc Sensor (FREESTYLE LIBRE 14 DAY SENSOR) MISC Inject 1 each into the skin every 14 (fourteen) days. Use as directed.  . gabapentin (NEURONTIN) 100 MG capsule Take 100 mg by mouth 3 (three) times daily.  . Garlic 0258 MG CAPS Take 1,000 mg by mouth daily.   . Insulin Degludec 200 UNIT/ML SOPN Inject 120 Units into the skin at bedtime.  . insulin lispro (HUMALOG) 100 UNIT/ML KiwkPen Inject 30-36 Units into the skin 3 (three) times daily before meals. Sliding scale insulin  . iron polysaccharides (NIFEREX) 150 MG capsule Take 150 mg by mouth at bedtime.   [name] lactulose (CONSTULOSE) 10 GM/15ML solution Take 30 g by mouth 3 (three) times daily. Taking 81ml bid  . milk thistle 175 MG tablet Take 175 mg by mouth 3 (three) times daily.   . mirtazapine (REMERON) 15 MG tablet Take 15 mg by mouth at bedtime.  . Multiple Vitamin (MULTIVITAMIN WITH MINERALS) TABS tablet Take 1 tablet by mouth daily.  . nadolol (CORGARD) 40 MG tablet Take 40 mg by mouth daily.  . Omega 3 1000 MG CAPS Take 2,000 mg by mouth 2 (two) times daily.   [name] omeprazole (PRILOSEC) 40 MG  capsule Take 40 mg by mouth daily.  . pravastatin (PRAVACHOL) 20 MG tablet Take 20 mg by mouth daily.  . propranolol (INDERAL) 40 MG tablet TAKE 1 TABLET BY MOUTH TWICE DAILY (Patient taking differently: Take 40 mg by mouth 2 (two) times daily. )  . rifaximin (XIFAXAN) 550 MG TABS tablet Take 550 mg by mouth 3 (three) times  daily.  . sitaGLIPtin (JANUVIA) 100 MG tablet Take 50 mg by mouth daily.  [name] spironolactone (ALDACTONE) 100 MG tablet Take 100 mg by mouth daily.   . tamsulosin (FLOMAX) 0.4 MG CAPS capsule Take 0.4 mg by mouth at bedtime.   . thiamine 100 MG tablet Take 100 mg by mouth daily. Vitamin B-1  . tiZANidine (ZANAFLEX) 2 MG tablet Take 2 mg by mouth every 6 (six) hours as needed for muscle spasms.   [name] torsemide (DEMADEX) 20 MG tablet Take 60 mg by mouth 2 (two) times daily. Morning & lunch  . triamcinolone cream (KENALOG) 0.1 % Apply 1 application topically 2 (two) times daily as needed for itching.  . Turmeric 500 MG TABS Take 500 mg by mouth daily.   [name] venlafaxine (EFFEXOR) 75 MG tablet Take 75 mg by mouth 3 (three) times daily with meals.   . vitamin E 400 UNIT capsule Take 400 Units by mouth 2 (two) times daily.    No facility-administered encounter medications on file as of 06/09/2018.     ALLERGIES: No Known Allergies  VACCINATION STATUS:  There is no immunization history on file for this patient.  Diabetes  He presents for his follow-up diabetic visit. He has type 2 diabetes mellitus. Onset time: He was diagnosed at approximate age of 41 years. His disease course has been worsening. Pertinent negatives for hypoglycemia include no confusion, headaches, pallor or seizures. Associated symptoms include polydipsia and polyuria. Pertinent negatives for diabetes include no chest pain, no fatigue, no polyphagia and no weakness. There are no hypoglycemic complications. Symptoms are worsening. Diabetic complications include nephropathy. Risk factors for coronary artery disease  include male sex, obesity, hypertension, dyslipidemia, family history and diabetes mellitus. Current diabetic treatments: He is currently on Tresiba 120 units nightly, Humalog 30 units 3 times daily AC, Januvia 50 mg once a day. He is compliant with treatment most of the time. His weight is fluctuating minimally. He is following a generally unhealthy diet. When asked about meal planning, he reported none. He has had a previous visit with a dietitian (Patient is working with a Microbiologist in [place].). His home blood glucose trend is increasing steadily. His breakfast blood glucose range is generally >200 mg/dl. His lunch blood glucose range is generally 180-200 mg/dl. His dinner blood glucose range is generally 180-200 mg/dl. His bedtime blood glucose range is generally >200 mg/dl. His overall blood glucose range is 180-200 mg/dl. An ACE inhibitor/angiotensin II receptor blocker is being taken. Eye exam is current.  Hypertension  This is a chronic problem. The current episode started more than 1 year ago. The problem is uncontrolled. Pertinent negatives include no chest pain, headaches, neck pain, palpitations or shortness of breath. Risk factors for coronary artery disease include diabetes mellitus, dyslipidemia, obesity, male gender, sedentary lifestyle and smoking/tobacco exposure. Hypertensive end-organ damage includes kidney disease.    Review of Systems  Constitutional: Negative for chills, fatigue, fever and unexpected weight change.  HENT: Negative for dental problem, mouth sores and trouble swallowing.   Eyes: Negative for visual disturbance.  Respiratory: Negative for cough, choking, chest tightness, shortness of breath and wheezing.   Cardiovascular: Negative for chest pain, palpitations and leg swelling.  Gastrointestinal: Negative for abdominal distention, abdominal pain, constipation, diarrhea, nausea and vomiting.  Endocrine: Positive for polydipsia and polyuria. Negative for  polyphagia.  Genitourinary: Negative for dysuria, flank pain, hematuria and urgency.  Musculoskeletal: Negative for back pain, gait problem, myalgias and neck pain.  Skin: Negative for  pallor, rash and wound.  Neurological: Negative for seizures, syncope, weakness, numbness and headaches.  Psychiatric/Behavioral: Negative for confusion and dysphoric mood.    Objective:    BP 130/72   Pulse 71   Ht 5\' 7"  (1.702 m)   Wt 248 lb (112.5 kg)   BMI 38.84 kg/m   Wt Readings from Last 3 Encounters:  06/09/18 248 lb (112.5 kg)  05/06/18 247 lb 12.8 oz (112.4 kg)  03/10/18 244 lb (110.7 kg)     Physical Exam Constitutional:      General: He is not in acute distress.    Appearance: He is well-developed.  HENT:     Head: Normocephalic and atraumatic.  Neck:     Musculoskeletal: Normal range of motion and neck supple.     Thyroid: No thyromegaly.     Trachea: No tracheal deviation.  Cardiovascular:     Rate and Rhythm: Normal rate.     Pulses:          Dorsalis pedis pulses are 1+ on the right side and 1+ on the left side.       Posterior tibial pulses are 1+ on the right side and 1+ on the left side.     Heart sounds: S1 normal and S2 normal. No murmur. No gallop.   Pulmonary:     Effort: Pulmonary effort is normal. No respiratory distress.     Breath sounds: No wheezing.  Abdominal:     General: There is no distension.     Tenderness: There is no abdominal tenderness. There is no guarding.  Musculoskeletal:     Right shoulder: He exhibits no swelling and no deformity.  Skin:    General: Skin is warm and dry.     Findings: No rash.     Nails: There is no clubbing.   Neurological:     Mental Status: He is alert and oriented to person, place, and time.     Cranial Nerves: No cranial nerve deficit.     Sensory: No sensory deficit.     Gait: Gait normal.     Deep Tendon Reflexes: Reflexes are normal and symmetric.  Psychiatric:        Speech: Speech normal.        Behavior:  Behavior is cooperative.        Judgment: Judgment normal.     CMP ( most recent) CMP     Component Value Date/Time   NA 139 06/02/2018 1219   K 3.8 06/02/2018 1219   CL 100 06/02/2018 1219   CO2 31 06/02/2018 1219   GLUCOSE 113 06/02/2018 1219   BUN 46 (H) 06/02/2018 1219   CREATININE 1.66 (H) 06/02/2018 1219   CALCIUM 9.2 06/02/2018 1219   PROT 6.8 06/02/2018 1219   ALBUMIN 3.1 (L) 04/07/2016 0719   AST 39 (H) 06/02/2018 1219   ALT 34 06/02/2018 1219   ALKPHOS 324 (H) 04/07/2016 0719   BILITOT 0.7 06/02/2018 1219   GFRNONAA 41 (L) 06/02/2018 1219   GFRAA 47 (L) 06/02/2018 1219    Diabetic Labs (most recent): Lab Results  Component Value Date   HGBA1C 8.2 (H) 06/02/2018   HGBA1C 6.5 (H) 03/03/2018   HGBA1C 9.1 (H) 10/20/2017   Lipid Panel     Component Value Date/Time   CHOL 112 02/25/2012 1034   TRIG 110 02/25/2012 1034     Assessment & Plan:   1. Type 2 diabetes mellitus with stage 3 chronic kidney disease, with long-term current use  of insulin ([place])  - Jeffrey [name] has currently uncontrolled symptomatic type 2 DM since 73 years of age. -He returns with increasing A1c of 8.2% from 6.5%.  Patient has ongoing anemia which may have underestimated his A1c.    His CGM analysis shows his average blood glucose is 256, 78% above range, 22% of time in range.  -No documented no reported hypoglycemia.  Recent labs are reviewed with him.   -his diabetes is complicated by renal insufficiency, obesity/sedentary life, history of smoking and Jeffrey [name] remains at a high risk for more acute and chronic complications which include CAD, CVA, CKD, retinopathy, and neuropathy. These are all discussed in detail with the patient.  - I have counseled him on diet management and weight loss, by adopting a carbohydrate restricted/protein rich diet. -He still admits to dietary indiscretions including consumption of unnecessary snacks and sweetened beverages.  -  Suggestion is made  for him to avoid simple carbohydrates  from his diet including Cakes, Sweet Desserts / Pastries, Ice Cream, Soda (diet and regular), Sweet Tea, Candies, Chips, Cookies, Store Bought Juices, Alcohol in Excess of  1-2 drinks a day, Artificial Sweeteners, and "Sugar-free" Products. This will help patient to have stable blood glucose profile and potentially avoid unintended weight gain.  - I encouraged him to switch to  unprocessed or minimally processed complex starch and increased protein intake (animal or plant source), fruits, and vegetables.  - he is advised to stick to a routine mealtimes to eat 3 meals  a day and avoid unnecessary snacks ( to snack only to correct hypoglycemia).   - I have approached him with the following individualized plan to manage diabetes and patient agrees:   -Based on his presentation with significantly above target glycemic profile and increased A1c of 8.2% he will can continue to require intensive treatment with basal/bolus insulin.   -He is advised to continue Tresiba 120 units nightly, increase Humalog to 35  units  3 times a day with meals  for pre-meal BG readings of 90-150mg /dl, plus patient specific correction dose for unexpected hyperglycemia above 150mg /dl, associated with strict monitoring of glucose 4 times a day-before meals and at bedtime.  -Adjustment parameters are given for hypo and hyperglycemia in writing. -Patient is encouraged to call clinic for blood glucose levels less than 70 or above 300 mg /dl.  -If he continues to require greater than 200 units of insulin U100, he will be considered for insulin U500 during his next visit. -He is advised to continue  Januvia 50 mg p.o. every morning with breakfast, therapeutically suitable for patient . -She is taken off of metformin due to declining renal function.    -Patient is not a candidate for SGLT2 inhibitors due to CKD.  - Patient specific target  A1c;  LDL, HDL, Triglycerides, and  Waist  Circumference were discussed in detail.  2) BP/HTN: His blood pressure is controlled to target.    He is advised to continue his current blood pressure medications including propranolol 40 mg p.o. daily, Spironolactone 100 mg p.o. daily, torsemide 40 mg p.o. twice daily.  3) Lipids/HPL:   His lipid panel from December 2018 showed LDL at 80.  He is advised to continue pravastatin 20 mg p.o. nightly.  Side effects and precautions discussed with him.    4)  Weight/Diet: He has no success on weight control.  This is mainly driven by his habit of constant snacking and consumption of sweetened beverages.    CDE  Consult has been initiated , exercise, and detailed carbohydrates information provided.  He is a good candidate for bariatric surgery, which is briefly mentioned to him.  5) Chronic Care/Health Maintenance:  -he  is on  Statin medications and  is encouraged to continue to follow up with Ophthalmology, Dentist,  Podiatrist at least yearly or according to recommendations, and advised to  stay away from smoking. I have recommended yearly flu vaccine and pneumonia vaccination at least every 5 years; moderate intensity exercise for up to 150 minutes weekly; and  sleep for at least 7 hours a day. -A prescription for a pair of diabetic shoes was provided to him.  - I advised patient to maintain close follow up with Monico Blitz, MD for primary care needs.  - Time spent with the patient: 25 min, of which >50% was spent in reviewing his blood glucose logs , discussing his hypo- and hyper-glycemic episodes, reviewing his current and  previous labs and insulin doses and developing a plan to avoid hypo- and hyper-glycemia. Please refer to Patient Instructions for Blood Glucose Monitoring and Insulin/Medications Dosing Guide"  in media tab for additional information. Jeffrey [name] participated in the discussions, expressed understanding, and voiced agreement with the above plans.  All questions were answered to  his satisfaction. he is encouraged to contact clinic should he have any questions or concerns prior to his return visit.   Follow up plan: - Return in about 3 months (around 09/08/2018) for Follow up with Pre-visit Labs, Meter, and Logs.  Jeffrey Lloyd, MD [facility] [address] [place], [place] [address] Phone: [phone]  Fax: [phone]    06/09/2018, 9:08 AM  This note was partially dictated with voice recognition software. Similar sounding words can be transcribed inadequately or may not  be corrected upon review.

## 2018-06-09 NOTE — Patient Instructions (Signed)

## 2018-06-16 ENCOUNTER — Ambulatory Visit (HOSPITAL_COMMUNITY): Payer: Medicare Other

## 2018-06-16 ENCOUNTER — Ambulatory Visit (HOSPITAL_COMMUNITY)
Admission: RE | Admit: 2018-06-16 | Discharge: 2018-06-16 | Disposition: A | Discharge status: Home / Self Care | Payer: Medicare Other | Source: Ambulatory Visit | Attending: Gastroenterology | Admitting: Gastroenterology

## 2018-06-16 DIAGNOSIS — K7030 Alcoholic cirrhosis of liver without ascites: Secondary | ICD-10-CM | POA: Diagnosis not present

## 2018-06-17 ENCOUNTER — Other Ambulatory Visit: Payer: Self-pay

## 2018-06-17 DIAGNOSIS — K7030 Alcoholic cirrhosis of liver without ascites: Secondary | ICD-10-CM

## 2018-06-24 ENCOUNTER — Telehealth: Payer: Self-pay

## 2018-06-24 NOTE — Telephone Encounter (Signed)
I gave him a hard copy Rx last time he was here. If he lost it ,  he can come get another one, can not send electronically.

## 2018-06-24 NOTE — Telephone Encounter (Signed)
Jeffrey [name] is asking for a Rx to [place] for his Diabetic Shoes, please advise?

## 2018-07-06 ENCOUNTER — Telehealth: Payer: Self-pay

## 2018-07-06 NOTE — Telephone Encounter (Signed)
Signed.

## 2018-07-06 NOTE — Telephone Encounter (Signed)
Jeffrey [name] is stating he has been trying for a month to get his diabetic shoes from [place] and still has not got them, the pharmacy is stating they are waiting to hear from our office, please advise?

## 2018-07-07 ENCOUNTER — Other Ambulatory Visit: Payer: Self-pay | Admitting: Internal Medicine

## 2018-07-22 ENCOUNTER — Other Ambulatory Visit: Payer: Self-pay | Admitting: Gastroenterology

## 2018-08-16 ENCOUNTER — Other Ambulatory Visit: Payer: Self-pay

## 2018-08-16 DIAGNOSIS — K7030 Alcoholic cirrhosis of liver without ascites: Secondary | ICD-10-CM

## 2018-09-13 ENCOUNTER — Ambulatory Visit: Payer: Medicare Other | Admitting: "Endocrinology

## 2018-09-16 LAB — CBC WITH DIFFERENTIAL/PLATELET
Absolute Monocytes: 346 cells/uL (ref 200–950)
Basophils Absolute: 31 cells/uL (ref 0–200)
Basophils Relative: 1.3 %
Eosinophils Absolute: 154 cells/uL (ref 15–500)
Eosinophils Relative: 6.4 %
HCT: 31.4 % — ABNORMAL LOW (ref 38.5–50.0)
Hemoglobin: 10.1 g/dL — ABNORMAL LOW (ref 13.2–17.1)
Lymphs Abs: 377 cells/uL — ABNORMAL LOW (ref 850–3900)
MCH: 30.6 pg (ref 27.0–33.0)
MCHC: 32.2 g/dL (ref 32.0–36.0)
MCV: 95.2 fL (ref 80.0–100.0)
MPV: 11.4 fL (ref 7.5–12.5)
Monocytes Relative: 14.4 %
Neutro Abs: 1493 cells/uL — ABNORMAL LOW (ref 1500–7800)
Neutrophils Relative %: 62.2 %
Platelets: 93 10*3/uL — ABNORMAL LOW (ref 140–400)
RBC: 3.3 10*6/uL — ABNORMAL LOW (ref 4.20–5.80)
RDW: 15.8 % — ABNORMAL HIGH (ref 11.0–15.0)
Total Lymphocyte: 15.7 %
WBC: 2.4 10*3/uL — ABNORMAL LOW (ref 3.8–10.8)

## 2018-09-16 LAB — COMPREHENSIVE METABOLIC PANEL
AG Ratio: 1.3 (calc) (ref 1.0–2.5)
ALT: 34 U/L (ref 9–46)
AST: 33 U/L (ref 10–35)
Albumin: 3.9 g/dL (ref 3.6–5.1)
Alkaline phosphatase (APISO): 84 U/L (ref 35–144)
BUN/Creatinine Ratio: 24 (calc) — ABNORMAL HIGH (ref 6–22)
BUN: 44 mg/dL — ABNORMAL HIGH (ref 7–25)
CO2: 34 mmol/L — ABNORMAL HIGH (ref 20–32)
Calcium: 9.5 mg/dL (ref 8.6–10.3)
Chloride: 96 mmol/L — ABNORMAL LOW (ref 98–110)
Creat: 1.82 mg/dL — ABNORMAL HIGH (ref 0.70–1.18)
Globulin: 2.9 g/dL (calc) (ref 1.9–3.7)
Glucose, Bld: 208 mg/dL — ABNORMAL HIGH (ref 65–139)
Potassium: 4.4 mmol/L (ref 3.5–5.3)
Sodium: 136 mmol/L (ref 135–146)
Total Bilirubin: 1 mg/dL (ref 0.2–1.2)
Total Protein: 6.8 g/dL (ref 6.1–8.1)

## 2018-09-16 LAB — PROTIME-INR
INR: 1.1
Prothrombin Time: 11.2 s (ref 9.0–11.5)

## 2018-09-29 ENCOUNTER — Other Ambulatory Visit: Payer: Self-pay | Admitting: *Deleted

## 2018-09-29 DIAGNOSIS — K7030 Alcoholic cirrhosis of liver without ascites: Secondary | ICD-10-CM

## 2018-10-10 ENCOUNTER — Ambulatory Visit (INDEPENDENT_AMBULATORY_CARE_PROVIDER_SITE_OTHER): Payer: Medicare Other | Admitting: Gastroenterology

## 2018-10-10 ENCOUNTER — Other Ambulatory Visit: Payer: Self-pay

## 2018-10-10 ENCOUNTER — Encounter: Payer: Self-pay | Admitting: Gastroenterology

## 2018-10-10 DIAGNOSIS — I8510 Secondary esophageal varices without bleeding: Secondary | ICD-10-CM

## 2018-10-10 DIAGNOSIS — K7030 Alcoholic cirrhosis of liver without ascites: Secondary | ICD-10-CM | POA: Diagnosis not present

## 2018-10-10 NOTE — Progress Notes (Signed)
Primary Care Physician:  Monico Blitz, MD Primary GI:  Garfield Cornea, MD    Patient Location: Home  Provider Location: [facility] office  Reason for Visit: Follow-up cirrhosis  Persons present on the virtual encounter, with roles: Patient, myself (provider), Charma Igo, CMA (updated meds and allergies)  Total time (minutes) spent on medical discussion: 15 minutes  Due to COVID-19, visit was conducted using [email or web address] method.  Visit was requested by patient.  Virtual Visit via [email or web address]  I connected with Pollie Friar on 10/10/18 at 10:30 AM EDT by [email or web address] and verified that I am speaking with the correct person using two identifiers.   I discussed the limitations, risks, security and privacy concerns of performing an evaluation and management service by telephone/video and the availability of in person appointments. I also discussed with the patient that there may be a patient responsible charge related to this service. The patient expressed understanding and agreed to proceed.   HPI:   Jeffrey [name] is a 73 y.o. male who presents for virtual visit regarding cirrhosis.  Patient last seen in December.  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  We reestablished care with him back in June 2019 after he had failed to follow-up.  He has a history of hepatic encephalopathy during hospitalization/postoperatively for incarcerated umbilical hernia and pneumatosis intestinalis in 2017.  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  Previously reports vaccination to hepatitis B but labs do not show immunity.  He is immune to hepatitis A.  He is up-to-date on hepatoma screening.  Right upper quadrant ultrasound in January showed cholelithiasis, gallbladder wall thickness again borderline prominent at 3 mm similar to prior exam, negative Murphy sign.  Increased  hepatic echogenicity consistent with fatty infiltration.  Labs in April showed meld 14, labs essentially stable except for renal function slightly worse.  He was advised to follow-up with Dr. Lowanda Foster his nephrologist, [name].  Plans to repeat labs in 4 weeks and repeat meld in 3 months.  Patient states his weight has been stable.  Weight of 244 pounds when he saw [name] 1 month ago.  He has to go for blood work per Dr. Lowanda Foster this week, sees Dr. Lowanda Foster on May 19 and his PCP on May 20.  Patient denies abdominal pain. Good appetite good. BM about 2-3 times per day, titrate lactulose accordingly. No melena, brbpr.  No reflux issues.  Wife states he has been pretty alert, no confusion.  They also report no significant lower extremity edema or abdominal swelling.  Current Outpatient Medications  Medication Sig Dispense Refill   Ascorbic Acid (VITAMIN C) 1000 MG tablet Take 1,000 mg by mouth 2 (two) times daily.     Continuous Blood Gluc Sensor (FREESTYLE LIBRE 14 DAY SENSOR) MISC Inject 1 each into the skin every 14 (fourteen) days. Use as directed. 2 each 2   Garlic 6767 MG CAPS Take 1,000 mg by mouth daily.      Insulin Degludec 200 UNIT/ML SOPN Inject 120 Units into the skin at bedtime.     insulin lispro (HUMALOG) 100 UNIT/ML KiwkPen 40mg  BID and 50 units at supper. Sliding scale insulin     iron polysaccharides (NIFEREX) 150 MG capsule Take 150 mg by mouth at bedtime.      lactulose (CONSTULOSE) 10 GM/15ML solution Taking 27ml 3 times a day  milk thistle 175 MG tablet Take 175 mg by mouth 3 (three) times daily.      mirtazapine (REMERON) 15 MG tablet Take 15 mg by mouth at bedtime.     Multiple Vitamin (MULTIVITAMIN WITH MINERALS) TABS tablet Take 1 tablet by mouth daily.     nadolol (CORGARD) 40 MG tablet TAKE 1 TABLET BY MOUTH DAILY 30 tablet 3   Omega 3 1000 MG CAPS Take 2,000 mg by mouth 2 (two) times daily.      omeprazole (PRILOSEC) 40 MG capsule Take 40 mg by mouth  daily.     pravastatin (PRAVACHOL) 20 MG tablet Take 20 mg by mouth daily.     rifaximin (XIFAXAN) 550 MG TABS tablet Take 550 mg by mouth 3 (three) times daily.     sitaGLIPtin (JANUVIA) 100 MG tablet Take 50 mg by mouth daily.     spironolactone (ALDACTONE) 100 MG tablet Take 100 mg by mouth daily.      tamsulosin (FLOMAX) 0.4 MG CAPS capsule Take 0.4 mg by mouth at bedtime.      thiamine 100 MG tablet Take 100 mg by mouth daily. Vitamin B-1     tiZANidine (ZANAFLEX) 2 MG tablet Take 2 mg by mouth every 6 (six) hours as needed for muscle spasms.      torsemide (DEMADEX) 20 MG tablet Take 60 mg by mouth 2 (two) times daily. Morning & lunch     triamcinolone cream (KENALOG) 0.1 % Apply 1 application topically 2 (two) times daily as needed for itching.  3   Turmeric 500 MG TABS Take 500 mg by mouth daily.      venlafaxine (EFFEXOR) 75 MG tablet Take 75 mg by mouth 3 (three) times daily with meals.      vitamin E 400 UNIT capsule Take 400 Units by mouth 2 (two) times daily.      No current facility-administered medications for this visit.     ROS:  General: Negative for anorexia, weight loss, fever, chills, fatigue, weakness. Eyes: Negative for vision changes.  ENT: Negative for hoarseness, difficulty swallowing , nasal congestion. CV: Negative for chest pain, angina, palpitations, dyspnea on exertion, peripheral edema.  Respiratory: Negative for dyspnea at rest, dyspnea on exertion, cough, sputum, wheezing.  GI: See history of present illness. GU:  Negative for dysuria, hematuria, urinary incontinence, urinary frequency, nocturnal urination.  MS: Negative for joint pain, low back pain.  Derm: Negative for rash or itching.  Neuro: Negative for weakness, abnormal sensation, seizure, frequent headaches, memory loss, confusion.  Psych: Negative for anxiety, depression, suicidal ideation, hallucinations.  Endo: Negative for unusual weight change.  Heme: Negative for bruising or  bleeding. Allergy: Negative for rash or hives.   Observations/Objective: Pleasant elderly Caucasian male in no acute distress.  Otherwise exam unavailable.  Lab Results  Component Value Date   CREATININE 1.82 (H) 09/16/2018   BUN 44 (H) 09/16/2018   NA 136 09/16/2018   K 4.4 09/16/2018   CL 96 (L) 09/16/2018   CO2 34 (H) 09/16/2018   Lab Results  Component Value Date   ALT 34 09/16/2018   AST 33 09/16/2018   ALKPHOS 324 (H) 04/07/2016   BILITOT 1.0 09/16/2018   Lab Results  Component Value Date   WBC 2.4 (L) 09/16/2018   HGB 10.1 (L) 09/16/2018   HCT 31.4 (L) 09/16/2018   MCV 95.2 09/16/2018   PLT 93 (L) 09/16/2018   Lab Results  Component Value Date   INR 1.1 09/16/2018  INR 1.1 06/02/2018   INR 1.0 11/30/2017     Assessment and Plan: Pleasant 73 year old gentleman with history of alcohol-related cirrhosis, last alcohol in 2014.  Recent labs indicated slight decline in MELD with increasing creatinine.  He has follow-up with Dr. Lowanda Foster next week with repeat labs planned for this week.  We will obtain records for review.  Plans to repeat labs for MELD calculation 2 months from now.  We will get a follow-up here physically at that time.  Clinically he is without complaints.  No significant concerns for edema per wife.  His weight has been stable.  Currently up-to-date with hepatoma screening.  He will continue nadolol as before.  Follow Up Instructions:    I discussed the assessment and treatment plan with the patient. The patient was provided an opportunity to ask questions and all were answered. The patient agreed with the plan and demonstrated an understanding of the instructions. AVS mailed to patient's home address.   The patient was advised to call back or seek an in-person evaluation if the symptoms worsen or if the condition fails to improve as anticipated.  I provided 15 minutes of virtual face-to-face time during this encounter.   Neil Crouch, PA-C

## 2018-10-10 NOTE — Patient Instructions (Signed)
1. Continue lactulose, titrate as needed to achieve 2-3 soft stools daily. 2. Continue nadolol 40 mg daily. 3. We will obtain a copy of labs that Dr. Lowanda Foster is doing this week. 4. Your next labs for [place] will be due in July 2020, we will send you a reminder letter. 5. We will plan to have you come back into the office in August 2020. 6. Call if you have any questions or concerns in the interim.

## 2018-10-10 NOTE — Progress Notes (Signed)
CC'D TO PCP °

## 2018-10-11 ENCOUNTER — Other Ambulatory Visit: Payer: Self-pay

## 2018-10-11 ENCOUNTER — Encounter: Payer: Self-pay | Admitting: Internal Medicine

## 2018-11-03 ENCOUNTER — Other Ambulatory Visit: Payer: Self-pay | Admitting: Gastroenterology

## 2018-11-08 ENCOUNTER — Encounter: Payer: Self-pay | Admitting: *Deleted

## 2018-12-17 LAB — COMPREHENSIVE METABOLIC PANEL
AG Ratio: 1.3 (calc) (ref 1.0–2.5)
ALT: 24 U/L (ref 9–46)
AST: 25 U/L (ref 10–35)
Albumin: 3.7 g/dL (ref 3.6–5.1)
Alkaline phosphatase (APISO): 82 U/L (ref 35–144)
BUN/Creatinine Ratio: 29 (calc) — ABNORMAL HIGH (ref 6–22)
BUN: 51 mg/dL — ABNORMAL HIGH (ref 7–25)
CO2: 28 mmol/L (ref 20–32)
Calcium: 9.3 mg/dL (ref 8.6–10.3)
Chloride: 98 mmol/L (ref 98–110)
Creat: 1.78 mg/dL — ABNORMAL HIGH (ref 0.70–1.18)
Globulin: 2.9 g/dL (calc) (ref 1.9–3.7)
Glucose, Bld: 221 mg/dL — ABNORMAL HIGH (ref 65–139)
Potassium: 4.4 mmol/L (ref 3.5–5.3)
Sodium: 135 mmol/L (ref 135–146)
Total Bilirubin: 0.9 mg/dL (ref 0.2–1.2)
Total Protein: 6.6 g/dL (ref 6.1–8.1)

## 2018-12-17 LAB — CBC WITH DIFFERENTIAL/PLATELET
Absolute Monocytes: 305 cells/uL (ref 200–950)
Basophils Absolute: 40 cells/uL (ref 0–200)
Basophils Relative: 1.6 %
Eosinophils Absolute: 143 cells/uL (ref 15–500)
Eosinophils Relative: 5.7 %
HCT: 28 % — ABNORMAL LOW (ref 38.5–50.0)
Hemoglobin: 8.3 g/dL — ABNORMAL LOW (ref 13.2–17.1)
Lymphs Abs: 338 cells/uL — ABNORMAL LOW (ref 850–3900)
MCH: 27.5 pg (ref 27.0–33.0)
MCHC: 29.6 g/dL — ABNORMAL LOW (ref 32.0–36.0)
MCV: 92.7 fL (ref 80.0–100.0)
MPV: 12.3 fL (ref 7.5–12.5)
Monocytes Relative: 12.2 %
Neutro Abs: 1675 cells/uL (ref 1500–7800)
Neutrophils Relative %: 67 %
Platelets: 91 10*3/uL — ABNORMAL LOW (ref 140–400)
RBC: 3.02 10*6/uL — ABNORMAL LOW (ref 4.20–5.80)
RDW: 17.5 % — ABNORMAL HIGH (ref 11.0–15.0)
Total Lymphocyte: 13.5 %
WBC: 2.5 10*3/uL — ABNORMAL LOW (ref 3.8–10.8)

## 2018-12-17 LAB — PROTIME-INR
INR: 1.1
Prothrombin Time: 11.1 s (ref 9.0–11.5)

## 2019-01-12 ENCOUNTER — Other Ambulatory Visit: Payer: Self-pay

## 2019-01-12 ENCOUNTER — Ambulatory Visit (INDEPENDENT_AMBULATORY_CARE_PROVIDER_SITE_OTHER): Payer: Medicare Other | Admitting: Gastroenterology

## 2019-01-12 ENCOUNTER — Telehealth: Payer: Self-pay | Admitting: Gastroenterology

## 2019-01-12 ENCOUNTER — Encounter: Payer: Self-pay | Admitting: *Deleted

## 2019-01-12 ENCOUNTER — Encounter: Payer: Self-pay | Admitting: Gastroenterology

## 2019-01-12 VITALS — BP 127/64 | HR 76 | Temp 98.4°F | Ht 67.0 in | Wt 250.0 lb

## 2019-01-12 DIAGNOSIS — K7030 Alcoholic cirrhosis of liver without ascites: Secondary | ICD-10-CM

## 2019-01-12 DIAGNOSIS — I8510 Secondary esophageal varices without bleeding: Secondary | ICD-10-CM | POA: Diagnosis not present

## 2019-01-12 NOTE — Telephone Encounter (Signed)
Needs CBC, PT/INR, CMET in 03/2019.  Need information from [facility], ie Is he getting both propranolol and nadolol? What dose and frequency. What dose and frequency is he getting for Xifaxan.

## 2019-01-12 NOTE — Telephone Encounter (Signed)
Lab orders placed.  

## 2019-01-12 NOTE — Assessment & Plan Note (Addendum)
Clinically stable.  In the setting of chronic renal insufficiency, meld was 16.  He is due for hepatoma screening via ultrasound.  We will make arrangements.  Consider EGD for esophageal variceal surveillance September 2021.  Currently listed being on both propanolol and nadolol.  Propanolol as prescribed by [name], nadolol by [place]. This has been an ongoing issue and at time of last visit I was told he was no longer on propanolol. We will verify with pharmacy. Today's heart rate still higher than would like. Further recommendations to follow.   Return to the office in six months to see Dr. Gala Romney.   Update labs in 03/2019

## 2019-01-12 NOTE — Telephone Encounter (Signed)
Pt is taking Propranolol 40 mg bid, Nadolol 40 mg once daily and Xifaxan 550 mg tid per the pharmacist. They have questioned if pt should be of propranolol and nadolol and said the functions are treated for something different. If medication needs to be changed, they asked for me to call back.

## 2019-01-12 NOTE — Patient Instructions (Signed)
1. Liver ultrasound as scheduled. See separate instructions. 2. I will review your medications with the pharmacist. It looks like you are on both propranolol and nadolol and probably don't need both. I will call you with further instructions.  3. Return to the office in six months.

## 2019-01-12 NOTE — Progress Notes (Signed)
Primary Care Physician: Monico Blitz, MD  Primary Gastroenterologist:  Garfield Cornea, MD   Chief Complaint  Patient presents with  . Cirrhosis    HPI: Jeffrey [name] is a 73 y.o. male here for follow-up.  He had a tele-visit in May 2020.  He has a history of alcohol-related cirrhosis. No alcohol since 2014.  He has a history of hepatic encephalopathy during hospitalization/postoperatively for incarcerated umbilical hernia and pneumatosis intestinalis in 2017.  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  He is immune to hepatitis A.  Reports previous vaccination to Hep B but does not show immunity.  He has renal insufficiency followed by Dr. Lowanda Foster.  Dr. Lowanda Foster is arranging for [facility], 2 doses.  He is due hepatoma screening.  Recent meld 16.  Pancytopenia stable, slight drop in hemoglobin.  Hemoglobin 10 back in April.  No melena or rectal bleeding.  Presents today without complaints.  Typically has a bowel movement every day.  Takes lactulose most days unless he has a doctor's appointment.  Denies constipation.  No abdominal pain.  Appetite is good.  He has had some mild lower extremity edema.  States he sleeps anytime he gets a chance.  Denies confusion.    Appears that he is taking propanolol and nadolol again.  We prescribed the nadolol, [name] prescribed the propanolol.  We are contacting [facility] to verify that he has been receiving both of these.  In addition need to verify dose and frequency of Xifaxan.   Current Outpatient Medications  Medication Sig Dispense Refill  . Ascorbic Acid (VITAMIN C) 1000 MG tablet Take 1,000 mg by mouth 2 (two) times daily.    . Continuous Blood Gluc Sensor (FREESTYLE LIBRE 14 DAY SENSOR) MISC Inject 1 each into the skin every 14 (fourteen) days. Use as directed. 2 each 2  . gabapentin (NEURONTIN) 100 MG capsule Take 1 capsule  by mouth daily.    . Garlic 5397 MG CAPS Take 1,000 mg by mouth daily.     . Insulin Degludec 200 UNIT/ML SOPN Inject 120 Units into the skin at bedtime.    . insulin lispro (HUMALOG) 100 UNIT/ML KiwkPen Inject 50 Units into the skin 3 (three) times daily.     . iron polysaccharides (NIFEREX) 150 MG capsule Take 150 mg by mouth at bedtime.     [name] lactulose (CONSTULOSE) 10 GM/15ML solution Taking 59ml 3 times a day    . milk thistle 175 MG tablet Take 175 mg by mouth 3 (three) times daily.     . mirtazapine (REMERON) 15 MG tablet Take 15 mg by mouth at bedtime.    . Multiple Vitamin (MULTIVITAMIN WITH MINERALS) TABS tablet Take 1 tablet by mouth daily.    . nadolol (CORGARD) 40 MG tablet TAKE 1 TABLET BY MOUTH EVERY DAY 30 tablet 11  . Omega 3 1000 MG CAPS Take 2,000 mg by mouth 2 (two) times daily.     [name] omeprazole (PRILOSEC) 40 MG capsule Take 40 mg by mouth daily.    . pravastatin (PRAVACHOL) 20 MG tablet Take 20 mg by mouth daily.    . propranolol (INDERAL) 40 MG tablet Take 40 mg by mouth 2 (two) times daily.    . rifaximin (XIFAXAN) 550 MG TABS tablet Take 550 mg by mouth 3 (three) times daily.    . sitaGLIPtin (JANUVIA) 100 MG tablet Take 100 mg  by mouth daily.     [name] spironolactone (ALDACTONE) 100 MG tablet Take 100 mg by mouth daily.     . tamsulosin (FLOMAX) 0.4 MG CAPS capsule Take 0.4 mg by mouth at bedtime.     . thiamine 100 MG tablet Take 100 mg by mouth daily. Vitamin B-1    . tiZANidine (ZANAFLEX) 2 MG tablet Take 2 mg by mouth every 6 (six) hours as needed for muscle spasms.     [name] torsemide (DEMADEX) 20 MG tablet Take 60-80 mg by mouth 2 (two) times daily. 80mg  in morning and 60mg  at lunch    . triamcinolone cream (KENALOG) 0.1 % Apply 1 application topically 2 (two) times daily as needed for itching.  3  . Turmeric 500 MG TABS Take 500 mg by mouth daily.     [name] venlafaxine (EFFEXOR) 75 MG tablet Take 75 mg by mouth 3 (three) times daily with meals.     . vitamin E 400 UNIT  capsule Take 400 Units by mouth 2 (two) times daily.      No current facility-administered medications for this visit.     Allergies as of 01/12/2019  . (No Known Allergies)    ROS:  General: Negative for anorexia, weight loss, fever, chills, fatigue, weakness. ENT: Negative for hoarseness, difficulty swallowing , nasal congestion. CV: Negative for chest pain, angina, palpitations, dyspnea on exertion, +peripheral edema.  Respiratory: Negative for dyspnea at rest, dyspnea on exertion, cough, sputum, wheezing.  GI: See history of present illness. GU:  Negative for dysuria, hematuria, urinary incontinence, urinary frequency, nocturnal urination.  Endo: Negative for unusual weight change.    Physical Examination:   BP 127/64   Pulse 76   Temp 98.4 F (36.9 C) (Temporal)   Ht 5\' 7"  (1.702 m)   Wt 250 lb (113.4 kg)   BMI 39.16 kg/m   General: Well-nourished, well-developed in no acute distress. Clear and oriented.  Eyes: No icterus.conj pale. Mouth: masked. Lungs: Clear to auscultation bilaterally.  Heart: Regular rate and rhythm, no murmurs rubs or gallops.  Abdomen: Bowel sounds are normal, nontender, nondistended, no hepatosplenomegaly or masses, no abdominal bruits or hernia , no rebound or guarding.   Extremities: 1+ pitting edema to mid-shin bilaterally. No clubbing or deformities. Neuro: Alert and oriented x 4   Skin: Warm and dry, no jaundice.   Psych: Alert and cooperative, normal mood and affect.  Labs:  Lab Results  Component Value Date   CREATININE 1.78 (H) 12/16/2018   BUN 51 (H) 12/16/2018   NA 135 12/16/2018   K 4.4 12/16/2018   CL 98 12/16/2018   CO2 28 12/16/2018   Lab Results  Component Value Date   ALT 24 12/16/2018   AST 25 12/16/2018   ALKPHOS 324 (H) 04/07/2016   BILITOT 0.9 12/16/2018   Lab Results  Component Value Date   WBC 2.5 (L) 12/16/2018   HGB 8.3 (L) 12/16/2018   HCT 28.0 (L) 12/16/2018   MCV 92.7 12/16/2018   PLT 91 (L)  12/16/2018   Lab Results  Component Value Date   INR 1.1 12/16/2018   INR 1.1 09/16/2018   INR 1.1 06/02/2018    Imaging Studies: No results found.

## 2019-01-13 MED ORDER — PROPRANOLOL HCL 40 MG PO TABS: ORAL_TABLET | ORAL | 5 refills | Status: DC

## 2019-01-13 MED ORDER — RIFAXIMIN 550 MG PO TABS: 550.0000 mg | ORAL_TABLET | Freq: Two times a day (BID) | ORAL | 11 refills | Status: DC

## 2019-01-13 NOTE — Telephone Encounter (Addendum)
We can check an ammonia level if she is concerned about drowsiness, tremors. May be good to get baseline. He should not be driving if concerns about elevated ammonia level.   Xifaxan is not FDA approved for TID dosing for hepatic encephalopathy. He probably should utilize his lactulose more regularly as prescribed to make sure 3 soft BMs daily. He told me yesterday that he has not been consistent with the lactulose and some days only one BM.   If he stays on Xifaxan TID, it will have to be written by PCP.

## 2019-01-13 NOTE — Telephone Encounter (Addendum)
Please let pharmacy and patient's wife know:  STOP NADOLOL.  INCREASE PROPRANOLOL 40MG  AT BF, 40MG  LUNCH, 20MG  EVENING NEW RX DONE.  XIFAXAN SHOULD BE 550MG  BID NOT TID. NEW RX DONE.  HAVE HIM COME BY FOR PULSE AND BP CHECK TWO WEEKS AFTER INCREASING PROPRANOL.  ALICIA, TELL PHARMACY TO CANCEL THE PROPRANOL FOR 60MG  AM AND 40MG  PM. I WANT HIM TO TAKE 40MG  BF, 40MG  LUNHC, 20MG  EVENING

## 2019-01-13 NOTE — Telephone Encounter (Signed)
Spoke with pts spouse. She is going to talk to his PCP about having his lab work done. She will call our office back if she needs an order from our office. I offered to place the order today and she would prefer to talk with the PCP first. They will try to be more consistent with the lactulose. Pt went this morning to have his iron infusion.

## 2019-01-13 NOTE — Telephone Encounter (Signed)
Pharmacy and spouse notified of changes.

## 2019-01-13 NOTE — Addendum Note (Signed)
Addended by: Mahala Menghini on: 01/13/2019 07:56 AM   Modules accepted: Orders

## 2019-01-13 NOTE — Telephone Encounter (Signed)
Pt's spouse wanted to mention that pt was shaking a lot this morning and she's afraid that he needs the Xifaxan tid instead of bid. She states that his doctor increased it to tid because of pts ammonia levels.

## 2019-01-20 ENCOUNTER — Ambulatory Visit (HOSPITAL_COMMUNITY): Payer: Medicare Other

## 2019-01-23 ENCOUNTER — Other Ambulatory Visit: Payer: Self-pay

## 2019-01-23 ENCOUNTER — Ambulatory Visit (HOSPITAL_COMMUNITY)
Admission: RE | Admit: 2019-01-23 | Discharge: 2019-01-23 | Disposition: A | Discharge status: Home / Self Care | Payer: Medicare Other | Source: Ambulatory Visit | Attending: Gastroenterology | Admitting: Gastroenterology

## 2019-01-23 DIAGNOSIS — K7030 Alcoholic cirrhosis of liver without ascites: Secondary | ICD-10-CM | POA: Diagnosis not present

## 2019-01-23 DIAGNOSIS — I8510 Secondary esophageal varices without bleeding: Secondary | ICD-10-CM | POA: Insufficient documentation

## 2019-02-03 ENCOUNTER — Telehealth: Payer: Self-pay | Admitting: Internal Medicine

## 2019-02-03 NOTE — Telephone Encounter (Signed)
[phone] please call patient wife about his medications-said he is having problems with uncontrollable bowels

## 2019-02-07 NOTE — Telephone Encounter (Signed)
Spoke with pts spouse. Pt had some loose stool for a couple days with a fever that started on Wednesday 02/02/2019. Pt saw his PCP and asked him to get a COVID test. Pt had a test the next day and is waiting for his results. Pt's diarrhea d/c as well as pts fever. Pt is continuing to take his Lactulose.

## 2019-02-09 ENCOUNTER — Encounter (INDEPENDENT_AMBULATORY_CARE_PROVIDER_SITE_OTHER): Payer: Medicare Other | Admitting: Ophthalmology

## 2019-02-16 ENCOUNTER — Encounter (INDEPENDENT_AMBULATORY_CARE_PROVIDER_SITE_OTHER): Payer: Medicare Other | Admitting: Ophthalmology

## 2019-02-17 ENCOUNTER — Telehealth: Payer: Self-pay | Admitting: Internal Medicine

## 2019-02-17 NOTE — Telephone Encounter (Signed)
Labs are in [place] office

## 2019-02-17 NOTE — Telephone Encounter (Addendum)
Labs dated February 16, 2019  White blood cell count 2400, hemoglobin 9.2, hematocrit 29.1, MCV 100, platelets 94,000, glucose 105, BUN 39, creatinine 2.08, albumin 3.8, total bilirubin 0.8, alkaline phosphatase 143, AST 49, ALT 49, TSH 2.370, ammonia 391 (normal 31-1 69), sodium 136  Compared to labs from July 17, his hemoglobin is slightly improved, his creatinine has declined from 1.78-2.08, BUN improved, AST and ALT slightly increased, alkaline phosphatase increased.  Unfortunately INR not done so MELD NA cannot be calculated.   Spoke to wife, [name]. There was a week he had diarrheal illness and had to hold lactulose. covid 19 negative. Diarrhea resolved. Still little nauseated. Back on lactulose, stays in bathroom for along time (2 hours) because continuous bowel movements. Xifaxan tid RX by PCP, we advised BID based on FDA. If they want TID they will need RX by PCP. Hemoccult test pending by PCP. No melena, brbpr. Lactulose 40cc twice a day. Encouraged to back down to 30cc bid. Goal of 3 soft bms daily. Sounds like he is spending too much time going with BMS, sitting for "2 hours at a time". Likely getting dehydrated and driving up his ammonia level.   Has had two iron infusions since 11/2018 in [place].   Dulce Sellar, will call with progress report on Monday. If worsens over the weekend, will go to ED.   Need to update labs in 3 months, CBC, CMET, PT/INR NOT 03/2019 per orignial plan.      ALICIA, DID YOU ROUTE THE TELEPHONE ENCOUNTER FROM 02/03/19 TO A PROVIDER?

## 2019-02-17 NOTE — Telephone Encounter (Signed)
LSL, Manuela Schwartz placed labs in your office for pt. Pt's spouse is concerned about his lab work.

## 2019-02-17 NOTE — Telephone Encounter (Signed)
Routing message 

## 2019-02-17 NOTE — Telephone Encounter (Signed)
Pt's wife called to say that she was having patient's PCP fax [place] his recent labs because she said his ammonia level was 300. I told her that I would let RMR nurse be aware and I will be waiting for Dr Manuella Ghazi to fax over the lab results.

## 2019-02-20 NOTE — Telephone Encounter (Signed)
Noted. The note from 02/03/2019 wasn't routed to a provider. Pt's spouse called at 11:58 AM 02/19/21. Per spouse, pt feels fine, no more sleepier than normal. Lactulose was reduced to 30 bid and pt doesn't feel any different. When asked about not feeling different, pt's spouse said he isn't showing signs of confusion nor is pt sleepy.

## 2019-02-20 NOTE — Telephone Encounter (Signed)
Noted  

## 2019-02-22 ENCOUNTER — Other Ambulatory Visit: Payer: Self-pay

## 2019-02-22 ENCOUNTER — Encounter (INDEPENDENT_AMBULATORY_CARE_PROVIDER_SITE_OTHER): Payer: Medicare Other | Admitting: Ophthalmology

## 2019-02-22 DIAGNOSIS — H353121 Nonexudative age-related macular degeneration, left eye, early dry stage: Secondary | ICD-10-CM

## 2019-02-22 DIAGNOSIS — H35033 Hypertensive retinopathy, bilateral: Secondary | ICD-10-CM | POA: Diagnosis not present

## 2019-02-22 DIAGNOSIS — K7030 Alcoholic cirrhosis of liver without ascites: Secondary | ICD-10-CM

## 2019-02-22 DIAGNOSIS — H43813 Vitreous degeneration, bilateral: Secondary | ICD-10-CM

## 2019-02-22 DIAGNOSIS — E113293 Type 2 diabetes mellitus with mild nonproliferative diabetic retinopathy without macular edema, bilateral: Secondary | ICD-10-CM

## 2019-02-22 DIAGNOSIS — H35372 Puckering of macula, left eye: Secondary | ICD-10-CM

## 2019-02-22 DIAGNOSIS — E11319 Type 2 diabetes mellitus with unspecified diabetic retinopathy without macular edema: Secondary | ICD-10-CM | POA: Diagnosis not present

## 2019-02-22 DIAGNOSIS — I10 Essential (primary) hypertension: Secondary | ICD-10-CM

## 2019-03-06 ENCOUNTER — Telehealth: Payer: Self-pay

## 2019-03-06 NOTE — Telephone Encounter (Signed)
Pt's wife left a vm they received the lab orders in the mail for CBC and CMP. Pt's wife asked if we need to check ammonia level since it was 391 on 02/16/2019.  I called and left Vm for a return call to see if pt is having any symptoms.

## 2019-03-07 ENCOUNTER — Other Ambulatory Visit: Payer: Self-pay

## 2019-03-07 DIAGNOSIS — K7030 Alcoholic cirrhosis of liver without ascites: Secondary | ICD-10-CM

## 2019-03-07 NOTE — Telephone Encounter (Addendum)
Let's make sure he is having at somewhere around 3-4 soft BMs per day.  Can titrate lactulose up or down to achieve this number of BMs.  Continue Xifaxan.  Can recheck ammonia if she wants to but not necessary as it doesn't change management.   NEEDS OV WITH RMR ONLY.

## 2019-03-07 NOTE — Telephone Encounter (Signed)
I spoke to Jeffrey [name]'s wife and she said he has some confusion and he has the shakes. Jeffrey [name] said his confusion is a little more than he usually has. [name], please advise!

## 2019-03-07 NOTE — Telephone Encounter (Signed)
Pt's wife is aware to titrate lactulose to achieve 3-4 soft Bm's daily. They really would like to check ammonia level and I have entered the lab and mailed to them to do with the other labs. They are aware pt needs an appt with Dr. Gala Romney and will be scheduled an appointment with him.  Forwarding to [place] to schedule the appointment.

## 2019-03-08 NOTE — Telephone Encounter (Signed)
SCHEDULED TO SEE RMR 03/17/19

## 2019-03-15 LAB — COMPREHENSIVE METABOLIC PANEL
AG Ratio: 1.3 (calc) (ref 1.0–2.5)
ALT: 35 U/L (ref 9–46)
AST: 33 U/L (ref 10–35)
Albumin: 3.5 g/dL — ABNORMAL LOW (ref 3.6–5.1)
Alkaline phosphatase (APISO): 109 U/L (ref 35–144)
BUN/Creatinine Ratio: 27 (calc) — ABNORMAL HIGH (ref 6–22)
BUN: 56 mg/dL — ABNORMAL HIGH (ref 7–25)
CO2: 28 mmol/L (ref 20–32)
Calcium: 9.2 mg/dL (ref 8.6–10.3)
Chloride: 97 mmol/L — ABNORMAL LOW (ref 98–110)
Creat: 2.11 mg/dL — ABNORMAL HIGH (ref 0.70–1.18)
Globulin: 2.7 g/dL (calc) (ref 1.9–3.7)
Glucose, Bld: 333 mg/dL — ABNORMAL HIGH (ref 65–99)
Potassium: 4.4 mmol/L (ref 3.5–5.3)
Sodium: 134 mmol/L — ABNORMAL LOW (ref 135–146)
Total Bilirubin: 0.6 mg/dL (ref 0.2–1.2)
Total Protein: 6.2 g/dL (ref 6.1–8.1)

## 2019-03-15 LAB — CBC WITH DIFFERENTIAL/PLATELET
Absolute Monocytes: 189 cells/uL — ABNORMAL LOW (ref 200–950)
Basophils Absolute: 30 cells/uL (ref 0–200)
Basophils Relative: 1.3 %
Eosinophils Absolute: 159 cells/uL (ref 15–500)
Eosinophils Relative: 6.9 %
HCT: 27.4 % — ABNORMAL LOW (ref 38.5–50.0)
Hemoglobin: 8.3 g/dL — ABNORMAL LOW (ref 13.2–17.1)
Lymphs Abs: 297 cells/uL — ABNORMAL LOW (ref 850–3900)
MCH: 30.5 pg (ref 27.0–33.0)
MCHC: 30.3 g/dL — ABNORMAL LOW (ref 32.0–36.0)
MCV: 100.7 fL — ABNORMAL HIGH (ref 80.0–100.0)
MPV: 11.7 fL (ref 7.5–12.5)
Monocytes Relative: 8.2 %
Neutro Abs: 1626 cells/uL (ref 1500–7800)
Neutrophils Relative %: 70.7 %
Platelets: 84 10*3/uL — ABNORMAL LOW (ref 140–400)
RBC: 2.72 10*6/uL — ABNORMAL LOW (ref 4.20–5.80)
RDW: 15.7 % — ABNORMAL HIGH (ref 11.0–15.0)
Total Lymphocyte: 12.9 %
WBC: 2.3 10*3/uL — ABNORMAL LOW (ref 3.8–10.8)

## 2019-03-15 LAB — PROTIME-INR
INR: 1.1
Prothrombin Time: 11.3 s (ref 9.0–11.5)

## 2019-03-15 LAB — AMMONIA: Ammonia: 151 umol/L — ABNORMAL HIGH (ref <=72)

## 2019-03-17 ENCOUNTER — Other Ambulatory Visit: Payer: Self-pay

## 2019-03-17 ENCOUNTER — Ambulatory Visit: Payer: Medicare Other | Admitting: Internal Medicine

## 2019-03-17 ENCOUNTER — Encounter: Payer: Self-pay | Admitting: Internal Medicine

## 2019-03-17 ENCOUNTER — Telehealth: Payer: Self-pay

## 2019-03-17 VITALS — BP 112/60 | HR 73 | Temp 97.0°F | Ht 67.0 in | Wt 249.4 lb

## 2019-03-17 DIAGNOSIS — K7030 Alcoholic cirrhosis of liver without ascites: Secondary | ICD-10-CM

## 2019-03-17 DIAGNOSIS — K7682 Hepatic encephalopathy: Secondary | ICD-10-CM

## 2019-03-17 DIAGNOSIS — K7290 Hepatic failure, unspecified without coma: Secondary | ICD-10-CM | POA: Diagnosis not present

## 2019-03-17 NOTE — Patient Instructions (Addendum)
Don't take Spironolactone and torsemide until October 16th  BMET middle of next week  Lactulose 40cc 3x daily -- need at least 3 BMs per day  Continue Xifaxinin  Continue Omeprazole  Get re-vaccinated for Hepatitis B  Increase propranolol to 40mg  in the morning and 40 mg in the evening  As discussed, driving, operating machinery and having loaded firearms in reach are risky situations with hepatic encephapathy and should be avoided.  AS discussed we will refer you over to [facility] for evaluation for possible liver transplant.  See [name] about better diabetes control  Further recommendations to follow

## 2019-03-17 NOTE — Telephone Encounter (Signed)
I called pt's wife, [name] and told her Dr. Gala Romney said for pt to hold Spironolactone and Torsemide until 03/22/2019.  He will have a Bmet done on that day.

## 2019-03-17 NOTE — Progress Notes (Signed)
Primary Care Physician:  Monico Blitz, MD Primary Gastroenterologist:  Dr. Gala Romney  Pre-Procedure History & Physical: HPI:  Jeffrey [name] is a 73 y.o. 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  History of chronic kidney disease followed by Dr. Lowanda Foster.  Recent labs indicate a MELD of 17.  Transient bump in his BUN and creatinine for which diuretic therapy has been temporarily held.  He does not always take his dose of lactulose twice daily.  Occasionally has 1-2 bowel movements daily.  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  Sister, accompanies him today, states he can be a little sleepier than usual from time to time.  Sometimes mentally a little "slow".  Has not had any melena or rectal bleeding.  He is taking a total of 100 mg of propranolol daily.  Heart rate continues to be a challenge getting to goal.   Past Medical History:  Diagnosis Date  . Alcoholic cirrhosis ([place])    patient reports completing hep a and b vaccines in 2006  . Anemia    has had 3 units of prbcs 2013  . Anxiety   . Asymptomatic gallstones    Ultrasound in 2006  . Cholelithiasis   . Depression   . Diabetes ([place])   . Duodenal ulcer with hemorrhage    per patient in 2006 or 2007, records have been requested  . 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 HOH (hard of hearing)   . Hypertension   . Splenomegaly    Ultrasound in 2006  . Tubular adenoma of colon     Past Surgical History:  Procedure Laterality Date  . APPENDECTOMY    . BIOPSY  02/03/2018   Procedure: BIOPSY;  Surgeon: Daneil Dolin, MD;  [facility];  Service: Endoscopy;;  bx of gastric polyps  . CATARACT EXTRACTION Right   . COLONOSCOPY  03/10/2005   Rectal polyp as described above, removed with snare. Left  sided  diverticula. The remainder of the colonic mucosa appeared normal. Inflammed polyp on path.  . COLONOSCOPY  04/1999   Dr. Thea Silversmith polyps removed,  Path showed hyperplastic  . COLONOSCOPY  10/2015   Dr. Britta Mccreedy: diverticulosis, single sessile tubular adenoma 3-7mm in size removed from descending colon.   . COLONOSCOPY WITH ESOPHAGOGASTRODUODENOSCOPY (EGD)  03/31/2012   IOM:BTDHRCB AVMs. Colonic diverticulosis. tubular adenoma colon  . ESOPHAGEAL BANDING N/A 02/03/2018   Procedure: ESOPHAGEAL BANDING;  Surgeon: Daneil Dolin, MD;  [facility];  Service: Endoscopy;  Laterality: N/A;  . ESOPHAGOGASTRODUODENOSCOPY  01/15/2005   Three columns grade 1 to 2 esophageal varices, otherwise normal esophageal mucosa.  Esophagus was not manipulated otherwise./Nodularity of the antrum with overlying erosions, nonspecific finding. Path showed rare H.pylori  . ESOPHAGOGASTRODUODENOSCOPY  09/2008   Dr. Gaylene Brooks columns of grade 2-3 esoph varices, only one column was prominent. Portal gastropathy, multiple gastrc polyps at antrum, two were 2cm with black eschar, bulbar polyps, bulbar erosions  . ESOPHAGOGASTRODUODENOSCOPY  11/2004   Dr. Brantley Stage 3 esoph varices  . ESOPHAGOGASTRODUODENOSCOPY  03/31/2012   RMR: 4 columns(3-GR 2, 1-GR1) non-bleeding esophageal varices, portal gastropathy, small HH, early GAVE, multiple gastric polyps   . ESOPHAGOGASTRODUODENOSCOPY (EGD) WITH PROPOFOL N/A 02/03/2018   Dr.  Bernardo Brayman: Esophageal varices, 3 columns grade 1-2.  Portal hypertensive gastropathy.  Multiple gastric polyps, biopsy consistent with hyperplastic.  [name] GASTRIC VARICES BANDING  03/31/2012   Procedure: GASTRIC VARICES BANDING;  Surgeon: Daneil Dolin, MD;  [facility];  Service: Endoscopy;  Laterality: N/A;  . UMBILICAL HERNIA REPAIR  2017   [facility]    Prior to Admission medications   Medication Sig Start Date End Date Taking? Authorizing Provider  Ascorbic Acid (VITAMIN  C) 1000 MG tablet Take 1,000 mg by mouth 2 (two) times daily.   Yes [provider]  CINNAMON PO Take 1,000 mg by mouth daily.   Yes [provider]  Continuous Blood Gluc Sensor (FREESTYLE LIBRE 14 DAY SENSOR) MISC Inject 1 each into the skin every 14 (fourteen) days. Use as directed. 12/15/17  Yes Nida, Marella Chimes, MD  gabapentin (NEURONTIN) 100 MG capsule Take 1 capsule by mouth daily. 01/07/19  Yes [provider]  Garlic 4540 MG CAPS Take 1,000 mg by mouth daily.    Yes [provider]  Insulin Degludec 200 UNIT/ML SOPN Inject 120 Units into the skin at bedtime.   Yes [provider]  insulin lispro (HUMALOG) 100 UNIT/ML KiwkPen Inject 50 Units into the skin 3 (three) times daily.    Yes [provider]  iron polysaccharides (NIFEREX) 150 MG capsule Take 150 mg by mouth at bedtime.    Yes [provider]  lactulose (CONSTULOSE) 10 GM/15ML solution Taking 49ml 3 times a day   Yes [provider]  milk thistle 175 MG tablet Take 175 mg by mouth 3 (three) times daily.    Yes [provider]  mirtazapine (REMERON) 15 MG tablet Take 15 mg by mouth at bedtime.   Yes [provider]  Multiple Vitamin (MULTIVITAMIN WITH MINERALS) TABS tablet Take 1 tablet by mouth daily.   Yes [provider]  Omega 3 1000 MG CAPS Take 2,000 mg by mouth 2 (two) times daily.    Yes [provider]  omeprazole (PRILOSEC) 40 MG capsule Take 40 mg by mouth daily.   Yes [provider]  pravastatin (PRAVACHOL) 20 MG tablet Take 20 mg by mouth daily.   Yes [provider]  Probiotic Product (PROBIOTIC DAILY PO) Take by mouth daily.   Yes [provider]  propranolol (INDERAL) 40 MG tablet TAKE 40MG  AT BREAKFAST, 40MG  AT LUNCH, 20 MG AT BEDTIME 01/13/19  Yes Mahala Menghini, PA-C  rifaximin (XIFAXAN) 550 MG TABS tablet Take 1 tablet (550 mg total) by mouth 2 (two) times daily. 01/13/19  Yes  Mahala Menghini, PA-C  sitaGLIPtin (JANUVIA) 100 MG tablet Take 100 mg by mouth daily.    Yes [provider]  spironolactone (ALDACTONE) 100 MG tablet Take 100 mg by mouth daily.    Yes [provider]  tamsulosin (FLOMAX) 0.4 MG CAPS capsule Take 0.4 mg by mouth at bedtime.    Yes [provider]  thiamine 100 MG tablet Take 100 mg by mouth daily. Vitamin B-1   Yes [provider]  tiZANidine (ZANAFLEX) 2 MG tablet Take 2 mg by mouth every 6 (six) hours as needed for muscle spasms.    Yes [provider]  torsemide (DEMADEX) 20 MG tablet Take 60-80 mg by mouth 2 (two) times daily. 80mg  in morning and 60mg  at lunch   Yes [provider]  triamcinolone cream (KENALOG) 0.1 % Apply 1 application topically 2 (two) times daily as  needed for itching. 12/30/17  Yes [provider]  Turmeric 500 MG TABS Take 500 mg by mouth daily.    Yes [provider]  venlafaxine (EFFEXOR) 75 MG tablet Take 75 mg by mouth 3 (three) times daily with meals.    Yes [provider]  vitamin E 400 UNIT capsule Take 400 Units by mouth 2 (two) times daily.    Yes [provider]  Zinc 50 MG TABS Take 1 tablet by mouth daily.   Yes [provider]    Allergies as of 03/17/2019  . (No Known Allergies)    Family History  Problem Relation Age of Onset  . Colon cancer Father 11       deceased age 15  . Breast cancer Sister        deceased  . Breast cancer Sister   . Stroke Mother   . Lung cancer Neg Hx   . Ovarian cancer Neg Hx     Social History   Socioeconomic History  . Marital status: Married    Spouse name: Not on file  . Number of children: 2  . Years of education: Not on file  . Highest education level: Not on file  Occupational History  . Occupation: Disabled    Employer: SELF EMPLOYED  Social Needs  . Financial resource strain: Not on file  . Food insecurity    Worry: Not on file    Inability: Not on  file  . Transportation needs    Medical: Not on file    Non-medical: Not on file  Tobacco Use  . Smoking status: Former Smoker    Packs/day: 0.50    Years: 25.00    Pack years: 12.50    Types: Cigarettes    Quit date: 01/28/1986    Years since quitting: 33.1  . Smokeless tobacco: Never Used  Substance and Sexual Activity  . Alcohol use: No    Comment: quit in 05/2013  . Drug use: No  . Sexual activity: Not Currently  Lifestyle  . Physical activity    Days per week: Not on file    Minutes per session: Not on file  . Stress: Not on file  Relationships  . Social Herbalist on phone: Not on file    Gets together: Not on file    Attends religious service: Not on file    Active member of club or organization: Not on file    Attends meetings of clubs or organizations: Not on file    Relationship status: Not on file  . Intimate partner violence    Fear of current or ex partner: Not on file    Emotionally abused: Not on file    Physically abused: Not on file    Forced sexual activity: Not on file  Other Topics Concern  . Not on file  Social History Narrative   Two step children from second marriage (divorced) who live with him along with some grandchildren.     Review of Systems: See HPI, otherwise negative ROS  Physical Exam: BP 112/60   Pulse 73   Temp (!) 97 F (36.1 C) (Oral)   Ht 5\' 7"  (1.702 m)   Wt 249 lb 6.4 oz (113.1 kg)   BMI 39.06 kg/m  General:   Alert,  pleasant and cooperative in NAD.  He does have asterixis Skin:  Intact without significant lesions or rashes. Neck:  Supple; no masses or thyromegaly. No significant cervical adenopathy.  Lungs:  Clear throughout to auscultation.   No wheezes, crackles, or rhonchi. No acute distress. Heart:  Regular rate and rhythm; no murmurs, clicks, rubs,  or gallops. Abdomen: Obese.  Soft and nontender.  No obvious ascites.   Pulses:  Normal pulses noted. Extremities: Trace LE edema  Impression/Plan: Very  pleasant 73 year old gentleman with established alcohol related cirrhosis.  No alcohol at all in the past 6 years.  He remains quite functional and very involved in his family's business.  He is having episodes of encephalopathy which are concerning.  Recent volume contraction for which diuretics have been temporarily held may well be a contributing factor.   Known esophageal varices on primary prophylaxis with propranolol.  He is not at heart rate target at this time.  Lengthy discussion with the patient and his sister regarding the downhill course it can be expected with progression of cirrhosis and its associated complications.  Specifically talked about the insidious nature of hepatic encephalopathy.  He has a very good support system centered around his spouse and sister who, again, accompanies him today.  Aside from cirrhosis, diabetes and CKD he enjoys fairly good health.  He has a good quality of life.  MELD is now 17.  Although he is relatively advanced in age, I feel it would be worthwhile for him to go down to [facility] to assess his candidacy for a liver transplant.    As discussed with the patient and his sister, he may ultimately deemed not be a liver transplant candidate but I think patient deserves an evaluation.   Recommendations:   Don't take Spironolactone and torsemide until October 16th  BMET middle of next week  Lactulose 40cc 3x daily -- need at least 3 BMs per day  Continue Xifaxinin  Continue Omeprazole  Get re-vaccinated for Hepatitis B  Increase propranolol to 40mg  in the morning and 40 mg in the evening  As discussed, driving, operating machinery and having loaded firearms in reach are risky situations with hepatic encephapathy and should be avoided.  As discussed, we will refer you [facility] to [facility] for evaluation for possible liver transplant.  See [name] about better diabetes control  Further recommendations to follow          Notice: This  dictation was prepared with Dragon dictation along with smaller phrase technology. Any transcriptional errors that result from this process are unintentional and may not be corrected upon review.

## 2019-03-20 ENCOUNTER — Telehealth: Payer: Self-pay | Admitting: Internal Medicine

## 2019-03-20 ENCOUNTER — Telehealth: Payer: Self-pay

## 2019-03-20 NOTE — Telephone Encounter (Signed)
Pt's wife, [name], called to confirm exactly how Dr. Gala Romney wants him to take Propranolol. AVS said increase to 40 mg in the Am and 40 mg in the evening. Pt was taking 40 mg in the morning, 40 mg at lunch and 20 mg in the evening. She is aware that Dr. Gala Romney is not here today, but will be in the office tomorrow.

## 2019-03-20 NOTE — Telephone Encounter (Signed)
See separate note today.

## 2019-03-20 NOTE — Telephone Encounter (Signed)
I reviewed pt med list they had with them - also I reviewed in Epic 40 mg and 20 mg daily.  IF pt has been taking a total of 100mg  daily; we need to increase to 60mg  po BID or 120mg  total daily. Pt needs to come for a nurse check (BP and pulse )on October 26th.

## 2019-03-20 NOTE — Telephone Encounter (Signed)
[phone] PATIENT WIFE, [name], CALLED WITH QUESTIONS ABOUT HUSBANDS MEDS.  WAS TOLD HIS PRESCRIPTION WOULD INCREASE DOSAGE AND SHE THINKS ITS NOT CHANGED

## 2019-03-21 ENCOUNTER — Telehealth: Payer: Self-pay | Admitting: Internal Medicine

## 2019-03-21 NOTE — Telephone Encounter (Signed)
Pt's wife called and is aware pt needs to take Propranolol 60 mg bid for a total of 120 mg daily. She is aware he needs to come by on 03/27/2019 for nurse BP and pulse check.

## 2019-03-21 NOTE — Telephone Encounter (Signed)
yes

## 2019-03-21 NOTE — Telephone Encounter (Signed)
Almyra Free will respond to patient via My Chart

## 2019-03-21 NOTE — Telephone Encounter (Signed)
Pt's wife called to see if patient could have his labs done on 10/22 while he was in the same building as his ENT doctor.

## 2019-03-21 NOTE — Telephone Encounter (Signed)
Forwarding to Dr.Rourk to advise!

## 2019-03-21 NOTE — Telephone Encounter (Signed)
PT's wife said he can come better on 03/29/2019 for nurse BP and pulse check. That will eliminate extra travel for them. She checks his vitals some anyway and will let us know if any concerns. Also, pt has appointment with [name] on 03/28/2019.  She also said she cannot find anyone to give him hep b vaccinations. He had series in 2006 and 2007. 1. Jan 09, 2005 2.Sept 11, 2006 3. Jul 09, 2005  She said she was told at a couple of the pharmacys that none of the pharmacies will give another series since he has had it once.

## 2019-03-21 NOTE — Telephone Encounter (Signed)
PT's wife is aware.

## 2019-03-23 ENCOUNTER — Ambulatory Visit (INDEPENDENT_AMBULATORY_CARE_PROVIDER_SITE_OTHER): Payer: Medicare Other | Admitting: Otolaryngology

## 2019-03-23 DIAGNOSIS — H903 Sensorineural hearing loss, bilateral: Secondary | ICD-10-CM

## 2019-03-24 ENCOUNTER — Telehealth: Payer: Self-pay | Admitting: *Deleted

## 2019-03-24 ENCOUNTER — Other Ambulatory Visit: Payer: Self-pay

## 2019-03-24 ENCOUNTER — Telehealth: Payer: Self-pay

## 2019-03-24 DIAGNOSIS — K7030 Alcoholic cirrhosis of liver without ascites: Secondary | ICD-10-CM

## 2019-03-24 LAB — BASIC METABOLIC PANEL
BUN/Creatinine Ratio: 21 (calc) (ref 6–22)
BUN: 31 mg/dL — ABNORMAL HIGH (ref 7–25)
CO2: 22 mmol/L (ref 20–32)
Calcium: 9.4 mg/dL (ref 8.6–10.3)
Chloride: 106 mmol/L (ref 98–110)
Creat: 1.48 mg/dL — ABNORMAL HIGH (ref 0.70–1.18)
Glucose, Bld: 170 mg/dL — ABNORMAL HIGH (ref 65–139)
Potassium: 5.9 mmol/L — ABNORMAL HIGH (ref 3.5–5.3)
Sodium: 136 mmol/L (ref 135–146)

## 2019-03-24 NOTE — Telephone Encounter (Signed)
Referral faxed to [facility] for liver transplant.

## 2019-03-24 NOTE — Telephone Encounter (Signed)
PT's wife is aware of the lab results and the directions to resume Demadex and do not take spironolactone at this time. I am mailing a 2 gm diet and she is aware he should stay on that.  She will bring him in Wed and do the weight check and BP and pulse check since he has another appointment in town on that day. BMET on file for 1 month.

## 2019-03-24 NOTE — Telephone Encounter (Signed)
Pt's wife said that he was a day late getting his labs done.  He was originally supposed to hold his spironolactone and torsemide until 03/22/2019, but didn't get blood work until 03/23/2019. She has not started it yet. Michela Pitcher he has gained 18 lbs since his visit here. His breathing is a little difficult but not bad yet, she said.  His BP last night was 120/102. This Am when he first got up it was 111/45. She just took it again and it is 114/54 and heart rate is 66.   She is asking should the dosage be changed in Spironolactone and torsemide.  Dr. Gala Romney, please advise!

## 2019-03-24 NOTE — Telephone Encounter (Signed)
Potassium a little high.  Labs otherwise look better.  Resume Demadex only 60 mg twice daily.  Emphasized 2 g sodium diet.  Send literature on same. No more spironolactone for the time being. Patient should come in the middle of next week and get weighed  Be met in 1 month

## 2019-03-24 NOTE — Telephone Encounter (Signed)
Pt's wife called in to f/u.  She wants to know if pt needs to restart his medication.  [phone]

## 2019-03-27 NOTE — Telephone Encounter (Signed)
FYI to Dr. Rourk.  

## 2019-03-27 NOTE — Telephone Encounter (Signed)
See phone note

## 2019-03-28 ENCOUNTER — Telehealth: Payer: Self-pay

## 2019-03-28 DIAGNOSIS — K7031 Alcoholic cirrhosis of liver with ascites: Secondary | ICD-10-CM

## 2019-03-28 NOTE — Telephone Encounter (Signed)
Pt was seen by his PCP and the doctor wants him to take Spironolactone 50 mg daily. Pt has gain a lot of fluid during the time pt was asked to d/c med due to his potassium level. Pt's feet are swollen and today he weighed 259. Per the scales in our office, when pt weighed in October, pt was 249. Pt has lost some of the fluid and has more to come off. Pt's PCP also had pt on Torsemide 4 times a day and the dose was changed to twice daily by our office. Is it ok to increase the Torsemide back to 4 times daily. Please advise. See pt's mychart message if needed.

## 2019-03-29 ENCOUNTER — Other Ambulatory Visit: Payer: Self-pay

## 2019-03-29 DIAGNOSIS — K7031 Alcoholic cirrhosis of liver with ascites: Secondary | ICD-10-CM

## 2019-03-29 NOTE — Telephone Encounter (Addendum)
Pt came in for weight, BP and pulse check per Dr. Roseanne Kaufman recommendation last week.  Weight: 260.2 lb   ( was 249 on 03/17/2019 when he saw Dr. Gala Romney)  BP: 121/61  Pulse 83  Pt's lower legs and feet swollen tight.   Forwarding to Neil Crouch, PA in Dr. Roseanne Kaufman absence.

## 2019-03-29 NOTE — Telephone Encounter (Signed)
Should be addressed by hospital APP

## 2019-03-29 NOTE — Telephone Encounter (Signed)
Noted  

## 2019-03-29 NOTE — Telephone Encounter (Signed)
Spoke with pt's spouse. Pt's spouse was notified of EG's recommendations. Instructions were given for medication recommendations. More to follow per RMR's review. Pt will come back in 1 week to recheck his weight & bp. Lab orders mailed to pt to have checked in 1 week.

## 2019-03-29 NOTE — Telephone Encounter (Signed)
Currently on Demadex 60 bid (total daily dose 120 mg). Noted increased edema and weight gain. I am very hesitant to use any Aldactone due to last potassium 5.9.  At this point let's increase Demadex to 40mg  4 times a day (total daily dose 160 mg). Due to the increase I'll add a BMP in 1 week; keep the BMP from RMR in a month. Have him come in for another weight and BP/pulse check when he goes to get lab done in a week.  Please explain to the patient that Aldactone gets your kidneys to "hold on to Potassium" and his potassium was high last time, so we don't want him to hold on to any more Potassium.  Call for any other problems.  Cc: RMR for any other changes

## 2019-03-29 NOTE — Addendum Note (Signed)
Addended by: Gordy Levan, ERIC A on: 03/29/2019 02:14 PM   Modules accepted: Orders

## 2019-03-29 NOTE — Telephone Encounter (Signed)
Forwarding to Neil Crouch as she last saw patient.   Candace Cruise

## 2019-03-29 NOTE — Telephone Encounter (Signed)
Communication noted.  

## 2019-03-29 NOTE — Telephone Encounter (Signed)
Forwarding to Walden Field, NP who is doing hospital today.

## 2019-04-03 ENCOUNTER — Other Ambulatory Visit: Payer: Self-pay

## 2019-04-03 ENCOUNTER — Encounter (HOSPITAL_COMMUNITY): Payer: Self-pay | Admitting: Emergency Medicine

## 2019-04-03 ENCOUNTER — Telehealth: Payer: Self-pay | Admitting: Internal Medicine

## 2019-04-03 ENCOUNTER — Emergency Department (HOSPITAL_COMMUNITY)
Admission: EM | Admit: 2019-04-03 | Discharge: 2019-04-04 | Disposition: A | Discharge status: Home / Self Care | Payer: Medicare Other | Attending: Emergency Medicine | Admitting: Emergency Medicine

## 2019-04-03 DIAGNOSIS — K922 Gastrointestinal hemorrhage, unspecified: Secondary | ICD-10-CM

## 2019-04-03 DIAGNOSIS — E8770 Fluid overload, unspecified: Secondary | ICD-10-CM

## 2019-04-03 DIAGNOSIS — R600 Localized edema: Secondary | ICD-10-CM | POA: Diagnosis not present

## 2019-04-03 DIAGNOSIS — Z794 Long term (current) use of insulin: Secondary | ICD-10-CM | POA: Insufficient documentation

## 2019-04-03 DIAGNOSIS — R140 Abdominal distension (gaseous): Secondary | ICD-10-CM | POA: Insufficient documentation

## 2019-04-03 DIAGNOSIS — E1122 Type 2 diabetes mellitus with diabetic chronic kidney disease: Secondary | ICD-10-CM | POA: Insufficient documentation

## 2019-04-03 DIAGNOSIS — I129 Hypertensive chronic kidney disease with stage 1 through stage 4 chronic kidney disease, or unspecified chronic kidney disease: Secondary | ICD-10-CM | POA: Diagnosis not present

## 2019-04-03 DIAGNOSIS — K7030 Alcoholic cirrhosis of liver without ascites: Secondary | ICD-10-CM

## 2019-04-03 DIAGNOSIS — Z87891 Personal history of nicotine dependence: Secondary | ICD-10-CM | POA: Insufficient documentation

## 2019-04-03 DIAGNOSIS — N183 Chronic kidney disease, stage 3 unspecified: Secondary | ICD-10-CM | POA: Insufficient documentation

## 2019-04-03 DIAGNOSIS — Z79899 Other long term (current) drug therapy: Secondary | ICD-10-CM | POA: Insufficient documentation

## 2019-04-03 LAB — COMPREHENSIVE METABOLIC PANEL
ALT: 29 U/L (ref 0–44)
AST: 35 U/L (ref 15–41)
Albumin: 3.1 g/dL — ABNORMAL LOW (ref 3.5–5.0)
Alkaline Phosphatase: 125 U/L (ref 38–126)
Anion gap: 10 (ref 5–15)
BUN: 28 mg/dL — ABNORMAL HIGH (ref 8–23)
CO2: 24 mmol/L (ref 22–32)
Calcium: 8.3 mg/dL — ABNORMAL LOW (ref 8.9–10.3)
Chloride: 99 mmol/L (ref 98–111)
Creatinine, Ser: 1.56 mg/dL — ABNORMAL HIGH (ref 0.61–1.24)
GFR calc Af Amer: 51 mL/min — ABNORMAL LOW (ref >60)
GFR calc non Af Amer: 44 mL/min — ABNORMAL LOW (ref >60)
Glucose, Bld: 166 mg/dL — ABNORMAL HIGH (ref 70–99)
Potassium: 3.5 mmol/L (ref 3.5–5.1)
Sodium: 133 mmol/L — ABNORMAL LOW (ref 135–145)
Total Bilirubin: 0.8 mg/dL (ref 0.3–1.2)
Total Protein: 6.9 g/dL (ref 6.5–8.1)

## 2019-04-03 LAB — CBC WITH DIFFERENTIAL/PLATELET
Abs Immature Granulocytes: 0.01 10*3/uL (ref 0.00–0.07)
Basophils Absolute: 0 10*3/uL (ref 0.0–0.1)
Basophils Relative: 1 %
Eosinophils Absolute: 0.2 10*3/uL (ref 0.0–0.5)
Eosinophils Relative: 5 %
HCT: 26.9 % — ABNORMAL LOW (ref 39.0–52.0)
Hemoglobin: 8.1 g/dL — ABNORMAL LOW (ref 13.0–17.0)
Immature Granulocytes: 0 %
Lymphocytes Relative: 8 %
Lymphs Abs: 0.3 10*3/uL — ABNORMAL LOW (ref 0.7–4.0)
MCH: 29 pg (ref 26.0–34.0)
MCHC: 30.1 g/dL (ref 30.0–36.0)
MCV: 96.4 fL (ref 80.0–100.0)
Monocytes Absolute: 0.4 10*3/uL (ref 0.1–1.0)
Monocytes Relative: 10 %
Neutro Abs: 2.8 10*3/uL (ref 1.7–7.7)
Neutrophils Relative %: 76 %
Platelets: 118 10*3/uL — ABNORMAL LOW (ref 150–400)
RBC: 2.79 MIL/uL — ABNORMAL LOW (ref 4.22–5.81)
RDW: 16 % — ABNORMAL HIGH (ref 11.5–15.5)
WBC: 3.7 10*3/uL — ABNORMAL LOW (ref 4.0–10.5)
nRBC: 0 % (ref 0.0–0.2)

## 2019-04-03 LAB — POC OCCULT BLOOD, ED: Fecal Occult Bld: POSITIVE — AB

## 2019-04-03 LAB — PROTIME-INR
INR: 1.2 (ref 0.8–1.2)
Prothrombin Time: 14.9 seconds (ref 11.4–15.2)

## 2019-04-03 LAB — LIPASE, BLOOD: Lipase: 38 U/L (ref 11–51)

## 2019-04-03 MED ORDER — FUROSEMIDE 10 MG/ML IJ SOLN
40.0000 mg | Freq: Once | INTRAMUSCULAR | Status: AC
  Administered 2019-04-03: 40 mg via INTRAVENOUS
  Filled 2019-04-03: qty 4

## 2019-04-03 NOTE — Telephone Encounter (Signed)
Noted recommendations

## 2019-04-03 NOTE — Telephone Encounter (Signed)
Pt's wife called. She needs clarification of patient's protranolol (?) directions. I asked her if she's called the pharmacy, but she said his medicine keeps getting changed since he last got it from the pharmacy. Please call her at [phone] Pt has appt with his PCP today at 3pm

## 2019-04-03 NOTE — Telephone Encounter (Signed)
Pt's wife called to say that Dr Manuella Ghazi told her to call RMR and let him know that the patient's feet and legs are swelling. Pt's souse did the recommendations of EG last week when advised. Pt increase the Demadex to four times daily =160 mg. Pt is scheduled to have his blood work done this week and repeat BP/weight. Pt's legs and feet are very swollen and pt had to find much larger shoes to put on since he isn't able to wear his normal shoes. Pt is wear socks but isn't able to get any compression socks on at this time. Pt's spouse states [name] is waiting to hear back on what should be done about pts medication. Pt isn't on Spironolactone at this time. PCP wanted pt to start on 50 mg last week of the Spironolactone.

## 2019-04-03 NOTE — Telephone Encounter (Signed)
Spoke with pt's spouse. Pt notified of of RMR's recommendations. Pt will come in this week to recheck his weight, have labs done. Pt will also have labs completed next week per RMR.

## 2019-04-03 NOTE — ED Provider Notes (Signed)
[facility] EMERGENCY DEPARTMENT Provider Note   CSN: [id number] Arrival date & time: 04/03/19  1614     History   Chief Complaint Chief Complaint  Patient presents with  . Abdominal Pain    HPI JAYLENN ALTIER is a 73 y.o. male with past medical history of alcoholic cirrhosis, chronic anemia, depression, diabetes, esophageal varices, history of alcohol abuse, hypertension and history of tubular adenoma of the colon who presents the emergency department with chief complaint of swelling.  Patient was taken off of his.  Diuretic medications by one of his providers.  He has had about a 10 pound weight gain predominantly in his lower extremities and abdomen.  He has been unable to apply his compression stockings.  He does feel more short of breath especially when lying flat.  His primary care doctor sent him into the emergency department today.  He denies any severe abdominal pain, nausea, vomiting.  He is passing gas.  He is having diarrhea.     HPI  Past Medical History:  Diagnosis Date  . Alcoholic cirrhosis ([place])    patient reports completing hep a and b vaccines in 2006  . Anemia    has had 3 units of prbcs 2013  . Anxiety   . Asymptomatic gallstones    Ultrasound in 2006  . Cholelithiasis   . Depression   . Diabetes ([place])   . Duodenal ulcer with hemorrhage    per patient in 2006 or 2007, records have been requested  . 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 HOH (hard of hearing)   . Hypertension   . Splenomegaly    Ultrasound in 2006  . Tubular adenoma of colon     Patient Active Problem List   Diagnosis Date Noted  . Alcoholic cirrhosis ([place]) 35/45/6256  . Type 2 diabetes mellitus with stage 3 chronic kidney disease, with long-term current use of insulin ([place]) 10/20/2017  . Essential hypertension, benign 10/20/2017  . Morbid obesity ([place]) 10/20/2017  . Mixed hyperlipidemia 10/20/2017  . Ascites   . SBP (spontaneous  bacterial peritonitis) ([place]) 01/12/2016  . Shigella dysenteriae 01/11/2016  . Diarrhea 01/10/2016  . Cirrhosis ([place]) 09/05/2012  . Alcoholic cirrhosis of liver with ascites ([place]) 03/09/2012  . Heme positive stool 03/09/2012  . Anemia due to chronic blood loss 03/09/2012  . Pancytopenia ([place]) 03/09/2012  . FH: colon cancer 03/09/2012  . Esophageal varices in alcoholic cirrhosis ([place]) 38/93/7342    Past Surgical History:  Procedure Laterality Date  . APPENDECTOMY    . BIOPSY  02/03/2018   Procedure: BIOPSY;  Surgeon: Daneil Dolin, MD;  Location: [facility] ENDO SUITE;  Service: Endoscopy;;  bx of gastric polyps  . CATARACT EXTRACTION Right   . COLONOSCOPY  03/10/2005   Rectal polyp as described above, removed with snare. Left sided  diverticula. The remainder of the colonic mucosa appeared normal. Inflammed polyp on path.  . COLONOSCOPY  04/1999   Dr. Thea Silversmith polyps removed,  Path showed hyperplastic  . COLONOSCOPY  10/2015   Dr. Britta Mccreedy: diverticulosis, single sessile tubular adenoma 3-76mm in size removed from descending colon.   . COLONOSCOPY WITH ESOPHAGOGASTRODUODENOSCOPY (EGD)  03/31/2012   AJG:OTLXBWI AVMs. Colonic diverticulosis. tubular adenoma colon  . ESOPHAGEAL BANDING N/A 02/03/2018   Procedure: ESOPHAGEAL BANDING;  Surgeon: Daneil Dolin, MD;  Location: [facility] ENDO SUITE;  Service: Endoscopy;  Laterality: N/A;  . ESOPHAGOGASTRODUODENOSCOPY  01/15/2005  Three columns grade 1 to 2 esophageal varices, otherwise normal esophageal mucosa.  Esophagus was not manipulated otherwise./Nodularity of the antrum with overlying erosions, nonspecific finding. Path showed rare H.pylori  . ESOPHAGOGASTRODUODENOSCOPY  09/2008   Dr. Gaylene Brooks columns of grade 2-3 esoph varices, only one column was prominent. Portal gastropathy, multiple gastrc polyps at antrum, two were 2cm with black eschar, bulbar polyps, bulbar erosions  . ESOPHAGOGASTRODUODENOSCOPY  11/2004   Dr. Brantley Stage 3 esoph  varices  . ESOPHAGOGASTRODUODENOSCOPY  03/31/2012   RMR: 4 columns(3-GR 2, 1-GR1) non-bleeding esophageal varices, portal gastropathy, small HH, early GAVE, multiple gastric polyps   . ESOPHAGOGASTRODUODENOSCOPY (EGD) WITH PROPOFOL N/A 02/03/2018   Dr. Gala Romney: Esophageal varices, 3 columns grade 1-2.  Portal hypertensive gastropathy.  Multiple gastric polyps, biopsy consistent with hyperplastic.  [name] GASTRIC VARICES BANDING  03/31/2012   Procedure: GASTRIC VARICES BANDING;  Surgeon: Daneil Dolin, MD;  Location: [facility] ENDO SU[facility];  Service: Endoscopy;  Laterality: N/A;  . UMBILICAL HERNIA REPAIR  2017   [facility]        Home Medications    Prior to Admission medications   Medication Sig Start Date End Date Taking? Authorizing Provider  Ascorbic Acid (VITAMIN C) 1000 MG tablet Take 1,000 mg by mouth 2 (two) times daily.    [provider]  CINNAMON PO Take 1,000 mg by mouth daily.    [provider]  Continuous Blood Gluc Sensor (FREESTYLE LIBRE 14 DAY SENSOR) MISC Inject 1 each into the skin every 14 (fourteen) days. Use as directed. 12/15/17   Cassandria Anger, MD  gabapentin (NEURONTIN) 100 MG capsule Take 1 capsule by mouth daily. 01/07/19   [provider]  Garlic 5621 MG CAPS Take 1,000 mg by mouth daily.     [provider]  Insulin Degludec 200 UNIT/ML SOPN Inject 120 Units into the skin at bedtime.    [provider]  insulin lispro (HUMALOG) 100 UNIT/ML KiwkPen Inject 50 Units into the skin 3 (three) times daily.     [provider]  iron polysaccharides (NIFEREX) 150 MG capsule Take 150 mg by mouth at bedtime.     [provider]  lactulose (CONSTULOSE) 10 GM/15ML solution Taking 4ml 3 times a day    [provider]  milk thistle 175 MG tablet Take 175 mg by mouth 3 (three) times daily.     [provider]  mirtazapine (REMERON) 15 MG tablet Take 15 mg by mouth at bedtime.    [provider]   Multiple Vitamin (MULTIVITAMIN WITH MINERALS) TABS tablet Take 1 tablet by mouth daily.    [provider]  Omega 3 1000 MG CAPS Take 2,000 mg by mouth 2 (two) times daily.     [provider]  omeprazole (PRILOSEC) 40 MG capsule Take 40 mg by mouth daily.    [provider]  pravastatin (PRAVACHOL) 20 MG tablet Take 20 mg by mouth daily.    [provider]  Probiotic Product (PROBIOTIC DAILY PO) Take by mouth daily.    [provider]  propranolol (INDERAL) 40 MG tablet TAKE 40MG  AT BREAKFAST, 40MG  AT LUNCH, 20 MG AT BEDTIME 01/13/19   Mahala Menghini, PA-C  rifaximin (XIFAXAN) 550 MG TABS tablet Take 1 tablet (550 mg total) by mouth 2 (two) times daily. 01/13/19   Mahala Menghini, PA-C  sitaGLIPtin (JANUVIA) 100 MG tablet Take 100 mg by mouth daily.     [provider]  spironolactone (ALDACTONE) 100 MG  tablet Take 100 mg by mouth daily.     [provider]  tamsulosin (FLOMAX) 0.4 MG CAPS capsule Take 0.4 mg by mouth at bedtime.     [provider]  thiamine 100 MG tablet Take 100 mg by mouth daily. Vitamin B-1    [provider]  tiZANidine (ZANAFLEX) 2 MG tablet Take 2 mg by mouth every 6 (six) hours as needed for muscle spasms.     [provider]  torsemide (DEMADEX) 20 MG tablet Take 60-80 mg by mouth 2 (two) times daily. 80mg  in morning and 60mg  at lunch    [provider]  triamcinolone cream (KENALOG) 0.1 % Apply 1 application topically 2 (two) times daily as needed for itching. 12/30/17   [provider]  Turmeric 500 MG TABS Take 500 mg by mouth daily.     [provider]  venlafaxine (EFFEXOR) 75 MG tablet Take 75 mg by mouth 3 (three) times daily with meals.     [provider]  vitamin E 400 UNIT capsule Take 400 Units by mouth 2 (two) times daily.     [provider]  Zinc 50 MG TABS Take 1 tablet by mouth daily.    [provider]    Family  History Family History  Problem Relation Age of Onset  . Colon cancer Father 27       deceased age 94  . Breast cancer Sister        deceased  . Breast cancer Sister   . Stroke Mother   . Lung cancer Neg Hx   . Ovarian cancer Neg Hx     Social History Social History   Tobacco Use  . Smoking status: Former Smoker    Packs/day: 0.50    Years: 25.00    Pack years: 12.50    Types: Cigarettes    Quit date: 01/28/1986    Years since quitting: 33.2  . Smokeless tobacco: Never Used  Substance Use Topics  . Alcohol use: No    Comment: quit in 05/2013  . Drug use: No     Allergies   Patient has no known allergies.   Review of Systems Review of Systems  Ten systems reviewed and are negative for acute change, except as noted in the HPI.   Physical Exam Updated Vital Signs BP (!) 144/69   Pulse 81   Temp 98.4 F (36.9 C) (Oral)   Resp (!) 21   Ht 5\' 7"  (1.702 m)   Wt 117.5 kg   SpO2 99%   BMI 40.57 kg/m   Physical Exam Vitals signs and nursing note reviewed.  Constitutional:      General: He is not in acute distress.    Appearance: He is well-developed. He is not diaphoretic.  HENT:     Head: Normocephalic and atraumatic.  Eyes:     General: No scleral icterus.    Conjunctiva/sclera: Conjunctivae normal.  Neck:     Musculoskeletal: Normal range of motion and neck supple.  Cardiovascular:     Rate and Rhythm: Normal rate and regular rhythm.     Heart sounds: Normal heart sounds.  Pulmonary:     Effort: Pulmonary effort is normal. No respiratory distress.     Breath sounds: Normal breath sounds.  Abdominal:     General: Abdomen is protuberant. Bowel sounds are normal. There is distension.     Palpations: Abdomen is soft. There is shifting dullness and fluid wave.  Tenderness: There is no abdominal tenderness.  Musculoskeletal:     Right lower leg: Edema present.     Left lower leg: Edema present.  Skin:    General: Skin is warm and dry.   Neurological:     Mental Status: He is alert.  Psychiatric:        Behavior: Behavior normal.      ED Treatments / Results  Labs (all labs ordered are listed, but only abnormal results are displayed) Labs Reviewed  COMPREHENSIVE METABOLIC PANEL - Abnormal; Notable for the following components:      Result Value   Sodium 133 (*)    Glucose, Bld 166 (*)    BUN 28 (*)    Creatinine, Ser 1.56 (*)    Calcium 8.3 (*)    Albumin 3.1 (*)    GFR calc non Af Amer 44 (*)    GFR calc Af Amer 51 (*)    All other components within normal limits  CBC WITH DIFFERENTIAL/PLATELET - Abnormal; Notable for the following components:   WBC 3.7 (*)    RBC 2.79 (*)    Hemoglobin 8.1 (*)    HCT 26.9 (*)    RDW 16.0 (*)    Platelets 118 (*)    Lymphs Abs 0.3 (*)    All other components within normal limits  POC OCCULT BLOOD, ED - Abnormal; Notable for the following components:   Fecal Occult Bld POSITIVE (*)    All other components within normal limits  LIPASE, BLOOD  PROTIME-INR    EKG None  Radiology No results found.  Procedures Procedures (including critical care time)  Medications Ordered in ED Medications  furosemide (LASIX) injection 40 mg (40 mg Intravenous Given 04/03/19 2144)     Initial Impression / Assessment and Plan / ED Course  I have reviewed the triage vital signs and the nursing notes.  Pertinent labs & imaging results that were available during my care of the patient were reviewed by me and considered in my medical decision making (see chart for details).  Clinical Course as of Apr 03 2219  Mon Apr 03, 2019  2103 Fecal Occult Blood, POC(!): POSITIVE [AH]  2103 Hemoglobin(!): 8.1 [AH]    Clinical Course User Index [AH] Margarita Mail, PA-C       73 year old male with history of cirrhosis, swelling of the lower extremities.  Did not appear to have anasarca.  Reviewed the patient's labs shows some pancytopenia.  His hemoglobin has dropped 2 g for the past 8  weeks and he does have a positive fecal occult bloods test.  He does have multiple reasons to potentially have some bleeding and will be referred in the outpatient setting to follow-up with his gastroenterologist.  Does not appear to need any emergent blood transfusions at this time.  Patient's CMP shows some low sodium.  Elevated blood glucose.  Creatinine is at baseline.  Patient's albumin level is low but not significantly so and not likely contributory to his swelling.  She was seen and shared visit with Dr. Sabra Heck.  Will provide IV Lasix.  He will observe him here in the emergency department and disposition when appropriate.  Patient's abdomen is soft and distended does not appear tight and in need of paracentesis. She is not significantly short of breath.  He is hemodynamically stable with normal respiratory rate and oxygen saturation.  Expect he will be discharged home with outpatient follow-up.  Final Clinical Impressions(s) / ED Diagnoses   Final diagnoses:  None    ED Discharge Orders    None       Margarita Mail, Hershal Coria 04/03/19 2224    Noemi Chapel, MD 04/04/19 [phone]

## 2019-04-03 NOTE — ED Provider Notes (Signed)
Medical screening examination/treatment/procedure(s) were conducted as a shared visit with non-physician practitioner(s) and myself.  I personally evaluated the patient during the encounter.  Clinical Impression:   Final diagnoses:  Hypervolemia, unspecified hypervolemia type  Gastrointestinal hemorrhage, unspecified gastrointestinal hemorrhage type    This very pleasant 73 year old male with a known history of alcoholic cirrhosis presents to the emergency department at the request of his family doctor for some evaluation of his edema and enlarging abdomen.  He reports a small amount of shortness of breath but overall is very edematous and on exam does have some abdominal distention though no anasarca is appreciated.  He does have edema that spreads up his lower extremities to just above the knees and is symmetrical.  There is signs of stasis dermatitis.  Otherwise he does not appear to be in any distress,  Anticipate diuresis and discharged with close follow-up, patient agreeable and appears medically stable to comply with this regimen.     Noemi Chapel, MD 04/04/19 [phone]

## 2019-04-03 NOTE — Telephone Encounter (Signed)
Spoke with pt's spouse. At pt's last ov 03/17/2019 pt was suppose to take the Propranolol 40 mg bid. Pt has an appointment with his PCP this afternoon and will discuss further with PCP.

## 2019-04-03 NOTE — Telephone Encounter (Signed)
He needs to be weighed in our office this week.  May resume spironolactone 50 mg daily today.  Patient needs to be reminded of a 2 g sodium diet.  He needs a be met the first of next week.  He needs an office visit with EG sometime next week

## 2019-04-03 NOTE — Telephone Encounter (Signed)
Pt was also going to call his PCP to discuss starting the Spironolactone 50 mg daily.

## 2019-04-03 NOTE — ED Triage Notes (Signed)
Sent from PCP for abdominal pain/swelling and lower leg swelling.  HX of cirrhosis

## 2019-04-03 NOTE — Telephone Encounter (Signed)
Pt's wife called to say that Dr Manuella Ghazi told her to call RMR and let him know that the patient's feet and legs are swelling. Please call her at [phone]

## 2019-04-03 NOTE — Telephone Encounter (Signed)
See other phone note

## 2019-04-04 ENCOUNTER — Telehealth: Payer: Self-pay | Admitting: Internal Medicine

## 2019-04-04 MED ORDER — FUROSEMIDE 10 MG/ML IJ SOLN
80.0000 mg | Freq: Once | INTRAMUSCULAR | Status: AC
  Administered 2019-04-04: 80 mg via INTRAVENOUS
  Filled 2019-04-04 (×2): qty 8

## 2019-04-04 NOTE — ED Notes (Signed)
Patient able to ambulate to the bathroom. O2 sats while ambulating were 90-91 percent. Patient states some shortness of breath. Patient has some swelling to bilateral legs. Patient complains of pain to bilateral feet because of swelling.    Patient states that he has been to the bathroom to urinate 4 times after taking the lasix that he was given here. Patient states that he does not feel any better as far as the increased fluid retention and leg pain.    Patient takes spirolactone at home and his initial dose was 100 mg' but states that his GI physician decreased the dose to 50 mg's daily.

## 2019-04-04 NOTE — Telephone Encounter (Signed)
See other phone note for documentation.  

## 2019-04-04 NOTE — Telephone Encounter (Signed)
Noted. Spoke with pt's spouse. Pt will come in this Friday before 11:00 to have his pulse checked and pt will also complete labs at the end of the week per RMR.

## 2019-04-04 NOTE — ED Provider Notes (Signed)
3:48 AM Assumed care from [place] and Dr. Sabra Heck, please see their note for full history, physical and decision making until this point. In brief this is a 73 y.o. year old male who presented to the ED tonight with Abdominal Pain     Apparently with worsened LE edema, and abdominal distension, sent by PCP for further management. Appears, from notes, patient plan for diuresis and discharge.   On my eval, no resp distress, speaks in full complete sentences. Ambulates without significant hypoxia or resp distress. No e/o pending hemodynamic instability. Patient appears to be stable for further outpatient management specialists and PCP's.   Discharge instructions, including strict return precautions for new or worsening symptoms, given. Patient and/or family verbalized understanding and agreement with the plan as described.   Labs, studies and imaging reviewed by myself and considered in medical decision making if ordered. Imaging interpreted by radiology.  Labs Reviewed  COMPREHENSIVE METABOLIC PANEL - Abnormal; Notable for the following components:      Result Value   Sodium 133 (*)    Glucose, Bld 166 (*)    BUN 28 (*)    Creatinine, Ser 1.56 (*)    Calcium 8.3 (*)    Albumin 3.1 (*)    GFR calc non Af Amer 44 (*)    GFR calc Af Amer 51 (*)    All other components within normal limits  CBC WITH DIFFERENTIAL/PLATELET - Abnormal; Notable for the following components:   WBC 3.7 (*)    RBC 2.79 (*)    Hemoglobin 8.1 (*)    HCT 26.9 (*)    RDW 16.0 (*)    Platelets 118 (*)    Lymphs Abs 0.3 (*)    All other components within normal limits  POC OCCULT BLOOD, ED - Abnormal; Notable for the following components:   Fecal Occult Bld POSITIVE (*)    All other components within normal limits  LIPASE, BLOOD  PROTIME-INR    No orders to display    No follow-ups on file.    Kipling Graser, Corene Cornea, MD 04/04/19 [phone]

## 2019-04-04 NOTE — Telephone Encounter (Signed)
[phone] patient wife called and said that he was in the hospital abd had blood taken, does he still need to go for this office

## 2019-04-04 NOTE — Telephone Encounter (Signed)
Pt's spouse called this morning. Pt was sent to [facility] yesterday 04/03/2019 after his apt with his PCP. Pt's medication of Spironolactone 50 mg daily wasn't changed yesterday.  Pt weighed at 253 lb and labs were drawn (CMP, PT/INR, Lipase, CBC). Pt was discharged and is at home resting. Pt's spouse wants to know if pt still needs to bring pt in office to weigh and complete the bmp this week. Pt is scheduled for a f/u with RMR on 04/11/2019 @ 10:30. Please advise.

## 2019-04-04 NOTE — Telephone Encounter (Signed)
He needs a BP and pulse check end of this week

## 2019-04-06 ENCOUNTER — Telehealth: Payer: Self-pay

## 2019-04-06 NOTE — Telephone Encounter (Signed)
Received a call from pt's pharmacy. They were calling to update pt's Propranolol medication. Per RMR's 03/2019 apt pt is to take Propranolol 40 mg twice daily. Medication was updated in the pharmacies system.

## 2019-04-07 NOTE — Telephone Encounter (Signed)
Pt returned in office today for BP. BP was 138/70, P 89, Temp 97.8, Weight 255lb. Pt is going to the lab this morning to complete labs

## 2019-04-07 NOTE — Telephone Encounter (Signed)
Routing to EG in the absence of RMR.

## 2019-04-07 NOTE — Telephone Encounter (Signed)
Patient pulse still not at goal. BP looks like it'd tolerate increase in dose of beta blocker.  Potassium seems to have normalized at 3.5 and we may be able to restart aldactone. It appears the patient has an appointment with me early next week.  I'll make changes to his regimen in a few days depending how he looks.

## 2019-04-11 ENCOUNTER — Other Ambulatory Visit: Payer: Self-pay

## 2019-04-11 ENCOUNTER — Ambulatory Visit: Payer: Medicare Other | Admitting: Nurse Practitioner

## 2019-04-11 ENCOUNTER — Encounter: Payer: Self-pay | Admitting: Nurse Practitioner

## 2019-04-11 VITALS — BP 108/59 | HR 87 | Temp 97.1°F | Ht 67.0 in | Wt 251.2 lb

## 2019-04-11 DIAGNOSIS — R600 Localized edema: Secondary | ICD-10-CM | POA: Diagnosis not present

## 2019-04-11 DIAGNOSIS — R609 Edema, unspecified: Secondary | ICD-10-CM | POA: Insufficient documentation

## 2019-04-11 DIAGNOSIS — I8510 Secondary esophageal varices without bleeding: Secondary | ICD-10-CM | POA: Diagnosis not present

## 2019-04-11 DIAGNOSIS — K7030 Alcoholic cirrhosis of liver without ascites: Secondary | ICD-10-CM

## 2019-04-11 DIAGNOSIS — R197 Diarrhea, unspecified: Secondary | ICD-10-CM | POA: Diagnosis not present

## 2019-04-11 LAB — BASIC METABOLIC PANEL
BUN/Creatinine Ratio: 15 (calc) (ref 6–22)
BUN: 26 mg/dL — ABNORMAL HIGH (ref 7–25)
CO2: 26 mmol/L (ref 20–32)
Calcium: 8.3 mg/dL — ABNORMAL LOW (ref 8.6–10.3)
Chloride: 98 mmol/L (ref 98–110)
Creat: 1.72 mg/dL — ABNORMAL HIGH (ref 0.70–1.18)
Glucose, Bld: 152 mg/dL — ABNORMAL HIGH (ref 65–139)
Potassium: 3.8 mmol/L (ref 3.5–5.3)
Sodium: 134 mmol/L — ABNORMAL LOW (ref 135–146)

## 2019-04-11 NOTE — Assessment & Plan Note (Signed)
Alcoholic cirrhosis complicated by known varices with beta-blockade but heart rate not at goal, lower extremity edema, hepatic encephalopathy.  His imaging and labs are up-to-date.  Encephalopathy doing well currently.  He is cut back on lactulose, however, due to new onset diarrhea.  Further management of diarrhea as per above.  Follow-up in 2 months.  Call for any worsening symptoms.

## 2019-04-11 NOTE — Progress Notes (Signed)
Referring Provider: Monico Blitz, MD Primary Care Physician:  Monico Blitz, MD Primary GI:  Dr. Gala Romney  Chief Complaint  Patient presents with  . Cirrhosis    f/u    HPI:   Jeffrey [name] is a 73 y.o. 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  The patient was last seen in our office 03/17/2019 for the same.  Complicated cirrhosis with decompensation as evidenced by recurrent encephalopathy, chronic kidney disease followed by nephrology.  Recent meld score of 17.  Diuretic therapy temporarily held due to transient bump in BUN/creatinine.  Not compliant with lactulose daily.  He remains active.  Family notes he is little sleepier from time to time and a little "slow".  On 100 mg of propranolol daily and heart rate continues to be a challenge getting the goal.  Known esophageal varices on primary prophylaxis.  Noted good support system.  Recommended stop Spironolactone and torsemide until October 16, BMP middle of the next week, lactulose 3 times daily for at least 3 bowel movements a day, continue rifaximin and omeprazole.  Complete revaccination for hepatitis B.  Increase propranolol to 40 mg in the morning and 40 mg in the evening.  Referral to [facility] for transplant evaluation.  Further recommended increasing propranolol to 60 mg twice daily with a pulse and BP check in our office.  Since holding his Aldactone in torsemide, on 03/24/2019, patient noted indicated he is gained a significant amount of weight due to edema.  They have been monitoring his blood pressure at home and it has been in an acceptable range.  Heart rate was found to be 66.  Also on 03/24/2019 his potassium was a little high and labs look better.  Recommended resume Demadex 60 mg twice daily, 2 g sodium diet, no more spironolactone for the time being.  Follow-up for weight in office.  03/28/2019 patient called with PCP request to increase torsemide by day and spironolactone 50 mg daily due  to edema.  It was noted his last potassium was 5.9.  Recommended increase Demadex from 60 mg twice daily (120 mg total daily dose) to 40 mg 4 times a day (total daily dose 160 mg.)  Recheck be met in 1 week.  Come back in for another BP and pulse check due to increase in heart rate in office.  The following week he was given the okay to resume spironolactone 50 mg daily, 2 g sodium diet, follow-up office visit.  Another BP and pulse check on 04/03/2019 found weight 255 pounds, blood pressure 138/70, pulse 89.  Pulse still not at goal, BP appears to have room for increase beta-blocker.  Potassium has normalized to 3.5.  Due to appointment upcoming in the next couple days recommended hold off on further recommendations until he can be seen in office.  The patient was seen in the emergency department 04/03/2019 for hyperkalemia, GI bleed.  Noted worsening lower extremity edema and abdominal distention and was referred by primary care.  Creatinine at that time 1.56.  No obvious anasarca.  Hemoglobin declined 2 g in the past 8 weeks and positive fecal occult blood test.  Recommended follow-up with GI.  Hemoglobin was 8.1, potassium was 3.5.  He was given IV Lasix.  Noted to be hemodynamically stable.  He was discharged to follow-up with primary care and GI.  Recheck of BMP 04/10/2019 found creatinine with a bump to 1.72, although appears to be in baseline for the past month.  Potassium normal at 3.8.  Very mildly hyponatremic with sodium of 134.  Recheck of CMP and INR on 04/03/2019.  Calculated meld score at 17; Child-Pugh Class B  Today he states he's doing ok overall. Swelling has improved on current diuretics. Started having diarrhea 1-2 weeks ago. Was recently in the ER. No recent hospital admission, no recent antibiotics. Has stopped taking Lactulose due to loose stools. Still on Xifaxan twice daily. Has had an accident this past week. Feels like he has some abdominal swelling. Having abdominal pain left-side  and lower abdomen. Has DDD, intermittent worsening back pain. Denies N/V, hematochezia, melena, fever, chills, unintentional weight loss. Denies yellowing of skin/eyes, darkened urine, generalized pruritis. No overt confusion currently. Appetite not great. Denies URI or flu-like symptoms. Denies loss of sense of taste or smell. Denies chest pain, dyspnea, dizziness, lightheadedness, syncope, near syncope. Denies any other upper or lower GI symptoms.  Today his weight is up about 1.5-2 lbs compared to last month at his office visit. Currently on propranolol 40 mg bid with HR 87 (not at goal.) On Aldactone 100 mg daily and Demadex 80 mg morning/60 mg evening. RUQ imaging up to date 01/23/2019.  Past Medical History:  Diagnosis Date  . Alcoholic cirrhosis ([place])    patient reports completing hep a and b vaccines in 2006  . Anemia    has had 3 units of prbcs 2013  . Anxiety   . Asymptomatic gallstones    Ultrasound in 2006  . Cholelithiasis   . Depression   . Diabetes ([place])   . Duodenal ulcer with hemorrhage    per patient in 2006 or 2007, records have been requested  . 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 HOH (hard of hearing)   . Hypertension   . Splenomegaly    Ultrasound in 2006  . Tubular adenoma of colon     Past Surgical History:  Procedure Laterality Date  . APPENDECTOMY    . BIOPSY  02/03/2018   Procedure: BIOPSY;  Surgeon: Daneil Dolin, MD;  [facility];  Service: Endoscopy;;  bx of gastric polyps  . CATARACT EXTRACTION Right   . COLONOSCOPY  03/10/2005   Rectal polyp as described above, removed with snare. Left sided  diverticula. The remainder of the colonic mucosa appeared normal. Inflammed polyp on path.  . COLONOSCOPY  04/1999   Dr. Thea Silversmith polyps removed,  Path showed hyperplastic  . COLONOSCOPY  10/2015   Dr. Britta Mccreedy: diverticulosis, single sessile tubular adenoma 3-67m in size removed from descending  colon.   . COLONOSCOPY WITH ESOPHAGOGASTRODUODENOSCOPY (EGD)  03/31/2012   RENI:DPOEUMPAVMs. Colonic diverticulosis. tubular adenoma colon  . ESOPHAGEAL BANDING N/A 02/03/2018   Procedure: ESOPHAGEAL BANDING;  Surgeon: RDaneil Dolin MD;  Location: [facility];  Service: Endoscopy;  Laterality: N/A;  . ESOPHAGOGASTRODUODENOSCOPY  01/15/2005   Three columns grade 1 to 2 esophageal varices, otherwise normal esophageal mucosa.  Esophagus was not manipulated otherwise./Nodularity of the antrum with overlying erosions, nonspecific finding. Path showed rare H.pylori  . ESOPHAGOGASTRODUODENOSCOPY  09/2008   Dr. RGaylene Brookscolumns of grade 2-3 esoph varices, only one column was prominent. Portal gastropathy, multiple gastrc polyps at antrum, two were 2cm with black eschar, bulbar polyps, bulbar erosions  . ESOPHAGOGASTRODUODENOSCOPY  11/2004   Dr. FBrantley Stage3 esoph varices  . ESOPHAGOGASTRODUODENOSCOPY  03/31/2012   RMR: 4 columns(3-GR 2, 1-GR1) non-bleeding esophageal varices, portal  gastropathy, small HH, early GAVE, multiple gastric polyps   . ESOPHAGOGASTRODUODENOSCOPY (EGD) WITH PROPOFOL N/A 02/03/2018   Dr. Gala Romney: Esophageal varices, 3 columns grade 1-2.  Portal hypertensive gastropathy.  Multiple gastric polyps, biopsy consistent with hyperplastic.  [name] GASTRIC VARICES BANDING  03/31/2012   Procedure: GASTRIC VARICES BANDING;  Surgeon: Daneil Dolin, MD;  Location: [facility];  Service: Endoscopy;  Laterality: N/A;  . UMBILICAL HERNIA REPAIR  2017   [facility]    Current Outpatient Medications  Medication Sig Dispense Refill  . Ascorbic Acid (VITAMIN C) 1000 MG tablet Take 1,000 mg by mouth 2 (two) times daily.    [name] CINNAMON PO Take 1,000 mg by mouth daily.    . Continuous Blood Gluc Sensor (FREESTYLE LIBRE 14 DAY SENSOR) MISC Inject 1 each into the skin every 14 (fourteen) days. Use as directed. 2 each 2  . gabapentin (NEURONTIN) 100 MG capsule Take 1 capsule by mouth daily.    .  Garlic 1610 MG CAPS Take 1,000 mg by mouth daily.     . Insulin Degludec 200 UNIT/ML SOPN Inject 120 Units into the skin at bedtime.    . insulin lispro (HUMALOG) 100 UNIT/ML KiwkPen Inject 50 Units into the skin 3 (three) times daily.     . iron polysaccharides (NIFEREX) 150 MG capsule Take 150 mg by mouth at bedtime.     [name] lactulose (CONSTULOSE) 10 GM/15ML solution Taking 75m 3 times a day    . milk thistle 175 MG tablet Take 175 mg by mouth 3 (three) times daily.     . mirtazapine (REMERON) 15 MG tablet Take 15 mg by mouth at bedtime.    . Multiple Vitamin (MULTIVITAMIN WITH MINERALS) TABS tablet Take 1 tablet by mouth daily.    . Omega 3 1000 MG CAPS Take 2,000 mg by mouth 2 (two) times daily.     .Marland Kitchenomeprazole (PRILOSEC) 40 MG capsule Take 40 mg by mouth daily.    . pravastatin (PRAVACHOL) 20 MG tablet Take 20 mg by mouth daily.    . Probiotic Product (PROBIOTIC DAILY PO) Take by mouth daily.    . propranolol (INDERAL) 40 MG tablet TAKE 40MG AT BREAKFAST, 40MG AT LUNCH, 20 MG AT BEDTIME 75 tablet 5  . rifaximin (XIFAXAN) 550 MG TABS tablet Take 1 tablet (550 mg total) by mouth 2 (two) times daily. 60 tablet 11  . sitaGLIPtin (JANUVIA) 100 MG tablet Take 100 mg by mouth daily.     .Marland Kitchenspironolactone (ALDACTONE) 100 MG tablet Take 50 mg by mouth daily.     . tamsulosin (FLOMAX) 0.4 MG CAPS capsule Take 0.4 mg by mouth at bedtime.     . thiamine 100 MG tablet Take 100 mg by mouth daily. Vitamin B-1    . tiZANidine (ZANAFLEX) 2 MG tablet Take 2 mg by mouth every 6 (six) hours as needed for muscle spasms.     .Marland Kitchentorsemide (DEMADEX) 20 MG tablet Take 60-80 mg by mouth 2 (two) times daily. 832min morning and 6071mt lunch    . triamcinolone cream (KENALOG) 0.1 % Apply 1 application topically 2 (two) times daily as needed for itching.  3  . Turmeric 500 MG TABS Take 500 mg by mouth daily.     . vMarland Kitchennlafaxine (EFFEXOR) 75 MG tablet Take 75 mg by mouth 3 (three) times daily with meals.     . vitamin E  400 UNIT capsule Take 400 Units by mouth 2 (two) times daily.     .[name]  Zinc 50 MG TABS Take 1 tablet by mouth daily.     No current facility-administered medications for this visit.     Allergies as of 04/11/2019  . (No Known Allergies)    Family History  Problem Relation Age of Onset  . Colon cancer Father 71       deceased age 30  . Breast cancer Sister        deceased  . Breast cancer Sister   . Stroke Mother   . Lung cancer Neg Hx   . Ovarian cancer Neg Hx     Social History   Socioeconomic History  . Marital status: Married    Spouse name: Not on file  . Number of children: 2  . Years of education: Not on file  . Highest education level: Not on file  Occupational History  . Occupation: Disabled    Employer: SELF EMPLOYED  Social Needs  . Financial resource strain: Not on file  . Food insecurity    Worry: Not on file    Inability: Not on file  . Transportation needs    Medical: Not on file    Non-medical: Not on file  Tobacco Use  . Smoking status: Former Smoker    Packs/day: 0.50    Years: 25.00    Pack years: 12.50    Types: Cigarettes    Quit date: 01/28/1986    Years since quitting: 33.2  . Smokeless tobacco: Never Used  Substance and Sexual Activity  . Alcohol use: No    Comment: quit in 05/2013  . Drug use: No  . Sexual activity: Not Currently  Lifestyle  . Physical activity    Days per week: Not on file    Minutes per session: Not on file  . Stress: Not on file  Relationships  . Social Herbalist on phone: Not on file    Gets together: Not on file    Attends religious service: Not on file    Active member of club or organization: Not on file    Attends meetings of clubs or organizations: Not on file    Relationship status: Not on file  Other Topics Concern  . Not on file  Social History Narrative   Two step children from second marriage (divorced) who live with him along with some grandchildren.     Review of Systems:  General: Negative for anorexia, weight loss, fever, chills, fatigue, weakness. Eyes: Negative for vision changes.  ENT: Negative for hoarseness, difficulty swallowing , nasal congestion. CV: Negative for chest pain, angina, palpitations, dyspnea on exertion, peripheral edema.  Respiratory: Negative for dyspnea at rest, dyspnea on exertion, cough, sputum, wheezing.  GI: See history of present illness. GU:  Negative for dysuria, hematuria, urinary incontinence, urinary frequency, nocturnal urination.  MS: Negative for joint pain, low back pain.  Derm: Negative for rash or itching.  Neuro: Negative for weakness, abnormal sensation, seizure, frequent headaches, memory loss, confusion.  Psych: Negative for anxiety, depression, suicidal ideation, hallucinations.  Endo: Negative for unusual weight change.  Heme: Negative for bruising or bleeding. Allergy: Negative for rash or hives.   Physical Exam: BP (!) 108/59   Pulse 87   Temp (!) 97.1 F (36.2 C) (Oral)   Ht _0  (1.702 m)   Wt 251 lb 3.2 oz (113.9 kg)   BMI 39.34 kg/m  General:   Alert and oriented. Pleasant and cooperative. Well-nourished and well-developed.  Head:  Normocephalic and atraumatic. Eyes:  Without icterus, sclera clear and conjunctiva pink.  Ears:  Normal auditory acuity. Mouth:  No deformity or lesions, oral mucosa pink.  Throat/Neck:  Supple, without mass or thyromegaly. Cardiovascular:  S1, S2 present without murmurs appreciated. Normal pulses noted. Extremities without clubbing or edema. Respiratory:  Clear to auscultation bilaterally. No wheezes, rales, or rhonchi. No distress.  Gastrointestinal:  +BS, soft, non-tender and non-distended. No HSM noted. No guarding or rebound. No masses appreciated.  Rectal:  Deferred  Musculoskalatal:  Symmetrical without gross deformities. Normal posture. Skin:  Intact without significant lesions or rashes. Neurologic:  Alert and oriented x4;  grossly normal neurologically.  Psych:  Alert and cooperative. Normal mood and affect. Heme/Lymph/Immune: No significant cervical adenopathy. No excessive bruising noted.    04/11/2019 11:14 AM   Disclaimer: This note was dictated with voice recognition software. Similar sounding words can inadvertently be transcribed and may not be corrected upon review.

## 2019-04-11 NOTE — Assessment & Plan Note (Signed)
Known esophageal varices and attempted nonselective beta-blocker.  His heart rate was at goal at home at least 1 time when he called.  However, in office it is not at goal.  Query whitecoat syndrome versus no adequate rest.  Until taking his heart rate in the office.  At this point I will have him increase his propranolol to 60 mg in the morning and 40 mg in the evening.  I have asked him to keep a log of his heart rate and blood pressure at home and call us in 1 week with these results.  We can further titrate his medications as needed.  Follow-up in 2 months.

## 2019-04-11 NOTE — Assessment & Plan Note (Signed)
The patient describes new onset significant, "blowout" diarrhea over the past couple weeks.  He was recently in the emergency department.  No other healthcare exposure or antibiotics.  At this point he has had to cut back on his lactulose due to significant loose stools.  I will have him check stool studies.  We can make further recommendations depending on results.  Recommend he continue Xifaxan twice daily.  Continue lactulose for 3-4 soft bowel movements a day.  Follow-up in 2 months and call with any worsening symptoms.

## 2019-04-11 NOTE — Progress Notes (Signed)
cc'ed to pcp °

## 2019-04-11 NOTE — Patient Instructions (Addendum)
Your health issues we discussed today were:   Cirrhosis with swelling, confusion, esophageal varices: 1. Increase your propranolol (Inderal) to 60 mg in the morning and 80 mg in the evening 2. Check your heart rate and blood pressure every day and write them down for the next week.  Call us in 1 week and let us know what they were 3. Continue your other current medications 4. Try to use compression socks or stockings, elevate your feet when you are sitting to help your swelling in your ankles 5. Have your lab tests drawn as already scheduled in a couple weeks 6. It is very important to not consume any more than 2 g (2000 mg) of sodium (salt) a day.  Check the nutrition label on your food and keep track of how much sodium you are eating. 7. Call us if you have any worsening or severe symptoms  New diarrhea: 1. Collect stool samples and bring them to the lab.  You will likely need to go to the lab first to get the collection kit 2. We will call you with the results 3. Further recommendations will follow  Overall I recommend:  1. Continue your other current medications 2. Call us if you have any questions or concerns 3. Return for follow-up in 2 months.   Because of recent events of COVID-19 ("Coronavirus"), follow CDC recommendations:  Wash your hand frequently Avoid touching your face Stay away from people who are sick If you have symptoms such as fever, cough, shortness of breath then call your healthcare provider for further guidance If you are sick, STAY AT HOME unless otherwise directed by your healthcare provider. Follow directions from state and national officials regarding staying safe   At [facility] we value your feedback. You may receive a survey about your visit today. Please share your experience as we strive to create trusting relationships with our patients to provide genuine, compassionate, quality care.  We appreciate your understanding and patience  as we review any laboratory studies, imaging, and other diagnostic tests that are ordered as we care for you. Our office policy is 5 business days for review of these results, and any emergent or urgent results are addressed in a timely manner for your best interest. If you do not hear from our office in 1 week, please contact us.   We also encourage the use of MyChart, which contains your medical information for your review as well. If you are not enrolled in this feature, an access code is on this after visit summary for your convenience. Thank you for allowing [place] to be involved in your care.  It was great to see you today!  I hope you have a Happy Thanksgiving!!

## 2019-04-11 NOTE — Assessment & Plan Note (Signed)
The patient has lower extremity edema.  We have had much back-and-forth with him about diuretics especially in light of elevated potassium level.  He is now back on his regular regimen consisting of Aldactone 100 mg daily and torsemide 80 mg in the morning and 60 mg in the evening.  On exam he does have some mild lower extremity edema.  Recommend he continue his current medications, use of compression socks/stockings, elevate lower extremities, 2 g sodium diet.  Abdominal girth is a bit large but soft and not tense ascites.  Recheck BMP as already scheduled in a couple weeks.  Follow-up in 2 months.

## 2019-04-14 ENCOUNTER — Other Ambulatory Visit: Payer: Self-pay

## 2019-04-14 ENCOUNTER — Emergency Department (HOSPITAL_COMMUNITY)
Admission: EM | Admit: 2019-04-14 | Discharge: 2019-04-14 | Disposition: A | Discharge status: Home / Self Care | Payer: Medicare Other | Attending: Emergency Medicine | Admitting: Emergency Medicine

## 2019-04-14 ENCOUNTER — Encounter (HOSPITAL_COMMUNITY): Payer: Self-pay | Admitting: Emergency Medicine

## 2019-04-14 DIAGNOSIS — E1122 Type 2 diabetes mellitus with diabetic chronic kidney disease: Secondary | ICD-10-CM | POA: Insufficient documentation

## 2019-04-14 DIAGNOSIS — K7030 Alcoholic cirrhosis of liver without ascites: Secondary | ICD-10-CM

## 2019-04-14 DIAGNOSIS — N183 Chronic kidney disease, stage 3 unspecified: Secondary | ICD-10-CM | POA: Diagnosis not present

## 2019-04-14 DIAGNOSIS — Z79899 Other long term (current) drug therapy: Secondary | ICD-10-CM | POA: Diagnosis not present

## 2019-04-14 DIAGNOSIS — Z7722 Contact with and (suspected) exposure to environmental tobacco smoke (acute) (chronic): Secondary | ICD-10-CM | POA: Diagnosis not present

## 2019-04-14 DIAGNOSIS — I129 Hypertensive chronic kidney disease with stage 1 through stage 4 chronic kidney disease, or unspecified chronic kidney disease: Secondary | ICD-10-CM | POA: Diagnosis not present

## 2019-04-14 DIAGNOSIS — R197 Diarrhea, unspecified: Secondary | ICD-10-CM | POA: Diagnosis present

## 2019-04-14 DIAGNOSIS — E119 Type 2 diabetes mellitus without complications: Secondary | ICD-10-CM | POA: Insufficient documentation

## 2019-04-14 DIAGNOSIS — Z87891 Personal history of nicotine dependence: Secondary | ICD-10-CM | POA: Diagnosis not present

## 2019-04-14 LAB — COMPREHENSIVE METABOLIC PANEL
ALT: 21 U/L (ref 0–44)
AST: 24 U/L (ref 15–41)
Albumin: 3 g/dL — ABNORMAL LOW (ref 3.5–5.0)
Alkaline Phosphatase: 119 U/L (ref 38–126)
Anion gap: 8 (ref 5–15)
BUN: 41 mg/dL — ABNORMAL HIGH (ref 8–23)
CO2: 22 mmol/L (ref 22–32)
Calcium: 8.4 mg/dL — ABNORMAL LOW (ref 8.9–10.3)
Chloride: 102 mmol/L (ref 98–111)
Creatinine, Ser: 2 mg/dL — ABNORMAL HIGH (ref 0.61–1.24)
GFR calc Af Amer: 38 mL/min — ABNORMAL LOW (ref >60)
GFR calc non Af Amer: 32 mL/min — ABNORMAL LOW (ref >60)
Glucose, Bld: 177 mg/dL — ABNORMAL HIGH (ref 70–99)
Potassium: 3.1 mmol/L — ABNORMAL LOW (ref 3.5–5.1)
Sodium: 132 mmol/L — ABNORMAL LOW (ref 135–145)
Total Bilirubin: 1.2 mg/dL (ref 0.3–1.2)
Total Protein: 7 g/dL (ref 6.5–8.1)

## 2019-04-14 LAB — CBC WITH DIFFERENTIAL/PLATELET
Abs Immature Granulocytes: 0.02 10*3/uL (ref 0.00–0.07)
Basophils Absolute: 0 10*3/uL (ref 0.0–0.1)
Basophils Relative: 1 %
Eosinophils Absolute: 0.1 10*3/uL (ref 0.0–0.5)
Eosinophils Relative: 2 %
HCT: 28.1 % — ABNORMAL LOW (ref 39.0–52.0)
Hemoglobin: 8.3 g/dL — ABNORMAL LOW (ref 13.0–17.0)
Immature Granulocytes: 0 %
Lymphocytes Relative: 4 %
Lymphs Abs: 0.2 10*3/uL — ABNORMAL LOW (ref 0.7–4.0)
MCH: 27.6 pg (ref 26.0–34.0)
MCHC: 29.5 g/dL — ABNORMAL LOW (ref 30.0–36.0)
MCV: 93.4 fL (ref 80.0–100.0)
Monocytes Absolute: 0.4 10*3/uL (ref 0.1–1.0)
Monocytes Relative: 6 %
Neutro Abs: 5.4 10*3/uL (ref 1.7–7.7)
Neutrophils Relative %: 87 %
Platelets: 143 10*3/uL — ABNORMAL LOW (ref 150–400)
RBC: 3.01 MIL/uL — ABNORMAL LOW (ref 4.22–5.81)
RDW: 16.3 % — ABNORMAL HIGH (ref 11.5–15.5)
WBC: 6.2 10*3/uL (ref 4.0–10.5)
nRBC: 0.3 % — ABNORMAL HIGH (ref 0.0–0.2)

## 2019-04-14 LAB — I-STAT CHEM 8, ED
BUN: 36 mg/dL — ABNORMAL HIGH (ref 8–23)
Calcium, Ion: 1.17 mmol/L (ref 1.15–1.40)
Chloride: 99 mmol/L (ref 98–111)
Creatinine, Ser: 1.9 mg/dL — ABNORMAL HIGH (ref 0.61–1.24)
Glucose, Bld: 164 mg/dL — ABNORMAL HIGH (ref 70–99)
HCT: 27 % — ABNORMAL LOW (ref 39.0–52.0)
Hemoglobin: 9.2 g/dL — ABNORMAL LOW (ref 13.0–17.0)
Potassium: 3.2 mmol/L — ABNORMAL LOW (ref 3.5–5.1)
Sodium: 136 mmol/L (ref 135–145)
TCO2: 21 mmol/L — ABNORMAL LOW (ref 22–32)

## 2019-04-14 LAB — TYPE AND SCREEN
ABO/RH(D): O POS
Antibody Screen: NEGATIVE

## 2019-04-14 LAB — POC OCCULT BLOOD, ED: Fecal Occult Bld: POSITIVE — AB

## 2019-04-14 LAB — PROTIME-INR
INR: 1.1 (ref 0.8–1.2)
Prothrombin Time: 13.8 seconds (ref 11.4–15.2)

## 2019-04-14 MED ORDER — PANTOPRAZOLE SODIUM 20 MG PO TBEC: 20.0000 mg | DELAYED_RELEASE_TABLET | Freq: Every day | ORAL | 0 refills | Status: DC

## 2019-04-14 MED ORDER — PANTOPRAZOLE SODIUM 40 MG IV SOLR
40.0000 mg | Freq: Once | INTRAVENOUS | Status: AC
  Administered 2019-04-14: 40 mg via INTRAVENOUS
  Filled 2019-04-14: qty 40

## 2019-04-14 MED ORDER — SODIUM CHLORIDE 0.9 % IV BOLUS
1000.0000 mL | Freq: Once | INTRAVENOUS | Status: AC
  Administered 2019-04-14: 1000 mL via INTRAVENOUS

## 2019-04-14 NOTE — ED Notes (Signed)
Called x 1 no answer

## 2019-04-14 NOTE — ED Triage Notes (Signed)
PT sent from PCP for GI bleeding and HgB down from 9 to a 7 today. PT states multiple loose bowel movements today (6-8).

## 2019-04-14 NOTE — Discharge Instructions (Signed)
Follow-up with Dr. Wonda Amis next week.  Start taking your protonic.  Return if any problem

## 2019-04-14 NOTE — ED Provider Notes (Signed)
[facility] EMERGENCY DEPARTMENT Provider Note   CSN: [id number] Arrival date & time: 04/14/19  1637     History   Chief Complaint Chief Complaint  Patient presents with  . GI Bleeding    Jeffrey [name] is a 73 y.o. male.     Patient has a history of cirrhosis.  He was seen at his family doctor's office and they were concerned for GI bleed.  The history is provided by the patient. No language interpreter was used.  Diarrhea Quality:  Semi-solid Severity:  Moderate Onset quality:  Sudden Timing:  Constant Progression:  Unchanged Relieved by:  Nothing Worsened by:  Nothing Associated symptoms: no abdominal pain and no headaches     Past Medical History:  Diagnosis Date  . Alcoholic cirrhosis ([place])    patient reports completing hep a and b vaccines in 2006  . Anemia    has had 3 units of prbcs 2013  . Anxiety   . Asymptomatic gallstones    Ultrasound in 2006  . Cholelithiasis   . Depression   . Diabetes ([place])   . Duodenal ulcer with hemorrhage    per patient in 2006 or 2007, records have been requested  . 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 HOH (hard of hearing)   . Hypertension   . Splenomegaly    Ultrasound in 2006  . Tubular adenoma of colon     Patient Active Problem List   Diagnosis Date Noted  . Edema 04/11/2019  . Alcoholic cirrhosis ([place]) 40/98/1191  . Type 2 diabetes mellitus with stage 3 chronic kidney disease, with long-term current use of insulin ([place]) 10/20/2017  . Essential hypertension, benign 10/20/2017  . Morbid obesity ([place]) 10/20/2017  . Mixed hyperlipidemia 10/20/2017  . Ascites   . SBP (spontaneous bacterial peritonitis) ([place]) 01/12/2016  . Shigella dysenteriae 01/11/2016  . Diarrhea 01/10/2016  . Cirrhosis ([place]) 09/05/2012  . Alcoholic cirrhosis of liver with ascites ([place]) 03/09/2012  . Heme positive stool 03/09/2012  . Anemia due to chronic blood loss 03/09/2012  .  Pancytopenia ([place]) 03/09/2012  . FH: colon cancer 03/09/2012  . 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 APPENDECTOMY    . BIOPSY  02/03/2018   Procedure: BIOPSY;  Surgeon: Daneil Dolin, MD;  Location: [facility] ENDO SUITE;  Service: Endoscopy;;  bx of gastric polyps  . CATARACT EXTRACTION Right   . COLONOSCOPY  03/10/2005   Rectal polyp as described above, removed with snare. Left sided  diverticula. The remainder of the colonic mucosa appeared normal. Inflammed polyp on path.  . COLONOSCOPY  04/1999   Dr. Thea Silversmith polyps removed,  Path showed hyperplastic  . COLONOSCOPY  10/2015   Dr. Britta Mccreedy: diverticulosis, single sessile tubular adenoma 3-74mm in size removed from descending colon.   . COLONOSCOPY WITH ESOPHAGOGASTRODUODENOSCOPY (EGD)  03/31/2012   [id number] AVMs. Colonic diverticulosis. tubular adenoma colon  . ESOPHAGEAL BANDING N/A 02/03/2018   Procedure: ESOPHAGEAL BANDING;  Surgeon: Daneil Dolin, MD;  Location: [facility] ENDO SUITE;  Service: Endoscopy;  Laterality: N/A;  . ESOPHAGOGASTRODUODENOSCOPY  01/15/2005   Three columns grade 1 to 2 esophageal varices, otherwise normal esophageal mucosa.  Esophagus was not manipulated otherwise./Nodularity of the antrum with overlying erosions, nonspecific finding. Path showed rare H.pylori  . ESOPHAGOGASTRODUODENOSCOPY  09/2008   Dr. Gaylene Brooks columns of grade 2-3 esoph  varices, only one column was prominent. Portal gastropathy, multiple gastrc polyps at antrum, two were 2cm with black eschar, bulbar polyps, bulbar erosions  . ESOPHAGOGASTRODUODENOSCOPY  11/2004   Dr. Brantley Stage 3 esoph varices  . ESOPHAGOGASTRODUODENOSCOPY  03/31/2012   RMR: 4 columns(3-GR 2, 1-GR1) non-bleeding esophageal varices, portal gastropathy, small HH, early GAVE, multiple gastric polyps   . ESOPHAGOGASTRODUODENOSCOPY (EGD) WITH PROPOFOL N/A 02/03/2018   Dr. Gala Romney: Esophageal varices,  3 columns grade 1-2.  Portal hypertensive gastropathy.  Multiple gastric polyps, biopsy consistent with hyperplastic.  [name] GASTRIC VARICES BANDING  03/31/2012   Procedure: GASTRIC VARICES BANDING;  Surgeon: Daneil Dolin, MD;  Location: [facility] END[facility] SU[facility];  Service: Endoscopy;  Laterality: N/A;  . UMBILICAL HERNIA REPAIR  2017   [facility]        Home Medications    Prior to Admission medications   Medication Sig Start Date End Date Taking? Authorizing Provider  Ascorbic Acid (VITAMIN C) 1000 MG tablet Take 1,000 mg by mouth 2 (two) times daily.   Yes [provider]  CINNAMON PO Take 1,000 mg by mouth daily.   Yes [provider]  gabapentin (NEURONTIN) 100 MG capsule Take 1 capsule by mouth daily. 01/07/19  Yes [provider]  Garlic 7616 MG CAPS Take 1,000 mg by mouth daily.    Yes [provider]  HYDROcodone-acetaminophen (NORCO/VICODIN) 5-325 MG tablet Take 1 tablet by mouth 2 (two) times daily as needed. 04/05/19  Yes [provider]  Insulin Degludec 200 UNIT/ML SOPN Inject 120 Units into the skin at bedtime.   Yes [provider]  insulin lispro (HUMALOG) 100 UNIT/ML KiwkPen Inject 50 Units into the skin 3 (three) times daily.    Yes [provider]  iron polysaccharides (NIFEREX) 150 MG capsule Take 150 mg by mouth at bedtime.    Yes [provider]  lactulose (CONSTULOSE) 10 GM/15ML solution Taking 18ml 3 times a day   Yes [provider]  milk thistle 175 MG tablet Take 175 mg by mouth 3 (three) times daily.    Yes [provider]  mirtazapine (REMERON) 15 MG tablet Take 15 mg by mouth at bedtime.   Yes [provider]  Multiple Vitamin (MULTIVITAMIN WITH MINERALS) TABS tablet Take 1 tablet by mouth daily.   Yes [provider]  Omega 3 1000 MG CAPS Take 2,000 mg by mouth 2 (two) times daily.    Yes [provider]  omega-3 acid ethyl esters (LOVAZA) 1 g capsule Take 2  capsules by mouth 2 (two) times daily. 04/07/19  Yes [provider]  pravastatin (PRAVACHOL) 20 MG tablet Take 20 mg by mouth daily.   Yes [provider]  Probiotic Product (PROBIOTIC DAILY PO) Take by mouth daily.   Yes [provider]  propranolol (INDERAL) 40 MG tablet TAKE 40MG  AT BREAKFAST, 40MG  AT LUNCH, 20 MG AT BEDTIME 01/13/19  Yes Mahala Menghini, PA-C  rifaximin (XIFAXAN) 550 MG TABS tablet Take 1 tablet (550 mg total) by mouth 2 (two) times daily. 01/13/19  Yes Mahala Menghini, PA-C  sitaGLIPtin (JANUVIA) 100 MG tablet Take 100 mg by mouth daily.    Yes [provider]  spironolactone (ALDACTONE) 100 MG tablet Take 50 mg by mouth daily.    Yes [provider]  tamsulosin (FLOMAX) 0.4 MG CAPS capsule Take 0.4 mg by mouth at bedtime.    Yes [provider]  thiamine 100 MG tablet Take 100 mg by mouth daily.  Vitamin B-1   Yes [provider]  tiZANidine (ZANAFLEX) 2 MG tablet Take 2 mg by mouth every 6 (six) hours as needed for muscle spasms.    Yes [provider]  torsemide (DEMADEX) 20 MG tablet Take 60-80 mg by mouth 2 (two) times daily. 80mg  in morning and 60mg  at lunch   Yes [provider]  Turmeric 500 MG TABS Take 500 mg by mouth daily.    Yes [provider]  venlafaxine (EFFEXOR) 75 MG tablet Take 75 mg by mouth 3 (three) times daily with meals.    Yes [provider]  vitamin E 400 UNIT capsule Take 400 Units by mouth 2 (two) times daily.    Yes [provider]  Zinc 50 MG TABS Take 1 tablet by mouth daily.   Yes [provider]  Continuous Blood Gluc Sensor (FREESTYLE LIBRE 14 DAY SENSOR) MISC Inject 1 each into the skin every 14 (fourteen) days. Use as directed. 12/15/17   Cassandria Anger, MD  omeprazole (PRILOSEC) 40 MG capsule Take 40 mg by mouth daily.    [provider]  pantoprazole (PROTONIX) 20 MG tablet Take 1 tablet (20 mg total) by mouth  daily. 04/14/19   Milton Ferguson, MD  triamcinolone cream (KENALOG) 0.1 % Apply 1 application topically 2 (two) times daily as needed for itching. 12/30/17   [provider]    Family History Family History  Problem Relation Age of Onset  . Colon cancer Father 44       deceased age 20  . Breast cancer Sister        deceased  . Breast cancer Sister   . Stroke Mother   . Lung cancer Neg Hx   . Ovarian cancer Neg Hx     Social History Social History   Tobacco Use  . Smoking status: Former Smoker    Packs/day: 0.50    Years: 25.00    Pack years: 12.50    Types: Cigarettes    Quit date: 01/28/1986    Years since quitting: 33.2  . Smokeless tobacco: Never Used  Substance Use Topics  . Alcohol use: No    Comment: quit in 05/2013  . Drug use: No     Allergies   Patient has no known allergies.   Review of Systems Review of Systems  Constitutional: Negative for appetite change and fatigue.  HENT: Negative for congestion, ear discharge and sinus pressure.   Eyes: Negative for discharge.  Respiratory: Negative for cough.   Cardiovascular: Negative for chest pain.  Gastrointestinal: Positive for diarrhea. Negative for abdominal pain.  Genitourinary: Negative for frequency and hematuria.  Musculoskeletal: Negative for back pain.  Skin: Negative for rash.  Neurological: Negative for seizures and headaches.  Psychiatric/Behavioral: Negative for hallucinations.     Physical Exam Updated Vital Signs BP 126/63   Pulse 87   Temp 98.3 F (36.8 C) (Oral)   Resp 16   Ht 5\' 7"  (1.702 m)   Wt 51.7 kg   SpO2 95%   BMI 17.85 kg/m   Physical Exam Vitals signs and nursing note reviewed.  Constitutional:      Appearance: He is well-developed.  HENT:     Head: Normocephalic.     Nose: Nose normal.  Eyes:     General: No scleral icterus.    Conjunctiva/sclera: Conjunctivae normal.  Neck:     Musculoskeletal: Neck supple.     Thyroid: No thyromegaly.   Cardiovascular:  Rate and Rhythm: Normal rate and regular rhythm.     Heart sounds: No murmur. No friction rub. No gallop.   Pulmonary:     Breath sounds: No stridor. No wheezing or rales.  Chest:     Chest wall: No tenderness.  Abdominal:     General: There is no distension.     Tenderness: There is no abdominal tenderness. There is no rebound.  Musculoskeletal: Normal range of motion.  Lymphadenopathy:     Cervical: No cervical adenopathy.  Skin:    Findings: No erythema or rash.  Neurological:     Mental Status: He is oriented to person, place, and time.     Motor: No abnormal muscle tone.     Coordination: Coordination normal.  Psychiatric:        Behavior: Behavior normal.      ED Treatments / Results  Labs (all labs ordered are listed, but only abnormal results are displayed) Labs Reviewed  COMPREHENSIVE METABOLIC PANEL - Abnormal; Notable for the following components:      Result Value   Sodium 132 (*)    Potassium 3.1 (*)    Glucose, Bld 177 (*)    BUN 41 (*)    Creatinine, Ser 2.00 (*)    Calcium 8.4 (*)    Albumin 3.0 (*)    GFR calc non Af Amer 32 (*)    GFR calc Af Amer 38 (*)    All other components within normal limits  CBC WITH DIFFERENTIAL/PLATELET - Abnormal; Notable for the following components:   RBC 3.01 (*)    Hemoglobin 8.3 (*)    HCT 28.1 (*)    MCHC 29.5 (*)    RDW 16.3 (*)    Platelets 143 (*)    nRBC 0.3 (*)    Lymphs Abs 0.2 (*)    All other components within normal limits  POC OCCULT BLOOD, ED - Abnormal; Notable for the following components:   Fecal Occult Bld POSITIVE (*)    All other components within normal limits  I-STAT CHEM 8, ED - Abnormal; Notable for the following components:   Potassium 3.2 (*)    BUN 36 (*)    Creatinine, Ser 1.90 (*)    Glucose, Bld 164 (*)    TCO2 21 (*)    Hemoglobin 9.2 (*)    HCT 27.0 (*)    All other components within normal limits  PROTIME-INR  TYPE AND SCREEN    EKG None   Radiology No results found.  Procedures Procedures (including critical care time)  Medications Ordered in ED Medications  pantoprazole (PROTONIX) injection 40 mg (40 mg Intravenous Given 04/14/19 2042)  sodium chloride 0.9 % bolus 1,000 mL (0 mLs Intravenous Stopped 04/14/19 2231)     Initial Impression / Assessment and Plan / ED Course  I have reviewed the triage vital signs and the nursing notes.  Pertinent labs & imaging results that were available during my care of the patient were reviewed by me and considered in my medical decision making (see chart for details).       Patient's hemoglobin is stable at above 8.  Patient is not tachycardic or hypotensive.  He will be sent home with some Protonix and follow-up with his GI doctor  Final Clinical Impressions(s) / ED Diagnoses   Final diagnoses:  Alcoholic cirrhosis, unspecified whether ascites present [facility])    ED Discharge Orders         Ordered    pantoprazole (PROTONIX) 20  MG tablet  Daily     04/14/19 2230           Milton Ferguson, MD 04/16/19 1119

## 2019-04-15 ENCOUNTER — Telehealth (INDEPENDENT_AMBULATORY_CARE_PROVIDER_SITE_OTHER): Payer: Self-pay | Admitting: Internal Medicine

## 2019-04-15 NOTE — Telephone Encounter (Signed)
Patient's wife called stating that her husband Jeffrey [name] had is still having diarrhea.  He has been off lactulose for 4 to 5 days. He had stool studies done by [name] and these were negative.  I presume he had C. difficile and GI pathogen panel which was recommended by Walden Field NP on his last visit. She also feels that his abdomen needs to be tapped.  He was seen in the emergency room yesterday. I recommended he should take loperamide 2 mg 3 times today and 3 times tomorrow. We will send message to Walden Field, NP to follow-up on Monday.

## 2019-04-17 ENCOUNTER — Telehealth: Payer: Self-pay | Admitting: Gastroenterology

## 2019-04-17 NOTE — Telephone Encounter (Signed)
Randall Hiss, Dr. Laural Golden took a call regarding patient over the weekend. Forwarding to you for update. Please see phone call dated 04/15/19.  Elmo Putt: can we get an update on patient, so Randall Hiss can address on 11/17?

## 2019-04-17 NOTE — Telephone Encounter (Signed)
Spoke with pt's spouse. Pt took a Cdiff test given by Dr. Trena Platt office. Cdiff was confirmed. Pt started antibiotics today. Pt is currently having diarrhea every 10 mins. Pt's spouse is aware that it's contagious and was given instructions on cleaning the bathrooms and surfaces of home.

## 2019-04-18 NOTE — Telephone Encounter (Signed)
Spoke with [name], he started pt on Flagyl 500 mg tid. [name] was told by his staff that his staff that the pt was positive. [name] will contact our office if there is anything different. Pt is currently taking Flagyl.

## 2019-04-18 NOTE — Telephone Encounter (Signed)
Noted  

## 2019-04-18 NOTE — Telephone Encounter (Signed)
Phone note by NUR states negative CDiff. Is that wrong? Please confirm if he had CDiff for sure.  Also, can we find out what antibiotics he's on?

## 2019-05-02 ENCOUNTER — Telehealth: Payer: Self-pay

## 2019-05-02 ENCOUNTER — Telehealth: Payer: Self-pay | Admitting: Internal Medicine

## 2019-05-02 NOTE — Telephone Encounter (Signed)
Medications updated in pts chart on 05/02/2019

## 2019-05-02 NOTE — Telephone Encounter (Signed)
Spoke with pts spouse. pts medications have been updated in our system as of today 05/02/2019. Pt's med list has changed some. Pt was discharged from [facility] on 04/28/2019. Pt's hospital stay was from 11/18- 04-28-2019. Pt was asked to contact our office after his discharge. Pt was taken off of Spironolactone 50 mg and was asked not to restart until he talked to our office. Pt continues to have diarrhea without abdominal pain. Pt was dx with Cdiff and he is currently taking Vancomycin 125 mg four times daily and is also on Bactrim 800/160 mg one pill daily. Pt is having watery diarrhea and has to change his depends several times a day. When asked how many bowel movements he's having, pts spouse states it's too many to count.

## 2019-05-02 NOTE — Telephone Encounter (Signed)
Pt's wife called asking for the nurse to call her sometime today. She said she was told to call after the patient was discharged from the hospital. [phone]

## 2019-05-04 NOTE — Telephone Encounter (Signed)
Noted. Spoke with spouse. Pt's spouse is aware of EG's recommendations and will call back if needed. Spouse is watching out for edema.

## 2019-05-04 NOTE — Telephone Encounter (Signed)
Make sure to complete antibiotics. When he finishes antibiotics, start a probiotic (several options available at the pharmacy). Some brands we've had good luck with include Restora, Align, [employer].  Call if worsening symptoms, worsening edema (swelling), or other questions/concerns.

## 2019-05-05 ENCOUNTER — Telehealth: Payer: Self-pay | Admitting: Internal Medicine

## 2019-05-05 DIAGNOSIS — K7031 Alcoholic cirrhosis of liver with ascites: Secondary | ICD-10-CM

## 2019-05-05 NOTE — Telephone Encounter (Signed)
Spoke with pts spouse. Spouse was advised to have pt use Aldactone 50 mg daily, continue the previous dose of Torsemide, finish antibiotics and blood work in 1 week. Will call pt next week to f/u.

## 2019-05-05 NOTE — Telephone Encounter (Signed)
[place], STATING THAT HE IS SWELLING IN HIS LEGS AND ABDOMEN AND WAS TOLD TO CALL AND LET us KNOW WHEN IT HAPPENED

## 2019-05-05 NOTE — Addendum Note (Signed)
Addended by: Gordy Levan, ERIC A on: 05/05/2019 10:43 AM   Modules accepted: Orders

## 2019-05-05 NOTE — Telephone Encounter (Addendum)
1. Please tell the patient to restart Aldactone (spironolactone) 50 mg daily. Continue previous dose of Torsemide. 2. For his bottom I can send in a compounded cream (steroid and lidocaine) to [facility]; Alternatively can send in just Anusol to any pharmacy. (Please let me know which he would prefer) 3. Start taking a probiotic daily (available OTC). Finish antibiotics to completion. 4. Call us if any worsening in the next couple days. Please call the patient and check on him on Monday. 5. Check BMP in 1 week (I have put the order in)

## 2019-05-05 NOTE — Telephone Encounter (Signed)
Spoke with pts spouse. Pt is having swelling of feet, legs and abdomen. Pt ws given two fluid pills (Torsemide) yesterday 05/04/2019 and will take 2 today 05/05/2019. Pt is also having some redness on his buttocks due to having several bowel movements and wear depends. Spouse reports a scaly spot on his lt buttock, that's been present since he started having the bowel movements several times a day. Spouse is apply a otc topical cream to pts buttock. No bleeding is present on buttock.

## 2019-05-08 ENCOUNTER — Telehealth: Payer: Self-pay | Admitting: Internal Medicine

## 2019-05-08 ENCOUNTER — Other Ambulatory Visit: Payer: Self-pay

## 2019-05-08 MED ORDER — HYDROCORTISONE (PERIANAL) 2.5 % EX CREA: 1.0000 "application " | TOPICAL_CREAM | Freq: Two times a day (BID) | CUTANEOUS | 1 refills | Status: DC

## 2019-05-08 NOTE — Telephone Encounter (Signed)
Noted. Cream was sent to pts pharmacy as directed. Pt's spouse is aware.

## 2019-05-08 NOTE — Telephone Encounter (Signed)
Pt's spouse called back. Pt is having swelling in his abdomen, legs and feet. When pts spouse called last week after pt was discharged from the hospital, EG asked pt to start back on Spironolactone 50 mg daily and to take his previous dose of the fluid pills. Pt isn't having shortness of breath at this time and is taking the medications daily as directed. Pt does have a virtual appointment with his PCP on tomorrow 05/09/2019 and will discuss pts swelling with them as well. Routing to RMR in the absence of EG.

## 2019-05-08 NOTE — Telephone Encounter (Signed)
Communication noted.  

## 2019-05-08 NOTE — Telephone Encounter (Signed)
Pt's spouse will notify me tomorrow of how pts appt went with his PCP and if any medications change.

## 2019-05-08 NOTE — Telephone Encounter (Signed)
OK; Anusol cream; disp 1 unit -  Apply to the anorectum BID prn. No refills

## 2019-05-08 NOTE — Telephone Encounter (Signed)
[place] ABOUT HIS LEGS

## 2019-05-08 NOTE — Telephone Encounter (Signed)
Pt's spouse called back and would like to try the Anusol cr for pt that was recommended by EG. They don't want to try the [facility] cream due to it being too expensive. Please advise dosage of Anusol cr in the absence of EG.

## 2019-05-09 ENCOUNTER — Telehealth: Payer: Self-pay | Admitting: Internal Medicine

## 2019-05-09 NOTE — Telephone Encounter (Signed)
Spoke with pt's spouse. Pt had a virtual visit with his PCP. Pt's fluid pills were change to Toresmide 3 tabs in the morning and 2 tabs at lunch. Pt was previously taking 2 tabs bid of Toreside. Pt's PCP can draw labs if our office needs lab work done for pt so he doesn't have to travel far due to the diarrhea.

## 2019-05-09 NOTE — Telephone Encounter (Signed)
Patient wife called, please call her back

## 2019-05-10 NOTE — Telephone Encounter (Signed)
Spoke with pt's spouse. She was notified of EG's recommendations and will give a progress report neck week with how pt is doing. Lab results will be sent by pts PCP when they are completed.

## 2019-05-10 NOTE — Telephone Encounter (Signed)
Yes, that would be great if they could draw labs (and fax results). Have the patient call us early next week and let us know how the swelling is doing.

## 2019-05-10 NOTE — Telephone Encounter (Signed)
Thanks! Let me know what he says.

## 2019-05-17 ENCOUNTER — Telehealth: Payer: Self-pay

## 2019-05-17 DIAGNOSIS — R197 Diarrhea, unspecified: Secondary | ICD-10-CM

## 2019-05-17 NOTE — Telephone Encounter (Signed)
Spoke with pts spouse. Pt will complete Cdiff testing. Further recommendations follow after testing. Pt advised to go to the ED or call our office with signs of fever, ect.

## 2019-05-17 NOTE — Telephone Encounter (Signed)
Pt's spouse called to discuss getting another round of Vancomycin. Pt took Vancomycin 4 times a day x 12 days. Pt finished the Vancomycin last Wednesday 05/10/2019 and was doing much better with less bowel movements. Pt is also on Bactrim daily for one year. Pt started having watery/ slim like diarrhea this week and his spouse would like to know if another round of Vancomycin may help pts diarrhea. Pt is having a bowel movement every 5 mins per pts spouse. Pt isn't having abdominal pain.

## 2019-05-17 NOTE — Telephone Encounter (Signed)
He would need CDiff retesting to see if Vanc is needed. However, it's not usually indicated this soon after due to spore shedding.  Lets start with a Probiotic daily x 2-3 months. I can put in CDiff stool tests for Antigen to see if there's ACTIVE CDiff ongoing. If so, we'll address based on guidelines.  Please have him pick up the collection kit and submit th sample to the lab ASAP. Call if any problems. If he develops a fever, severe abdominal pain, or other concerning symptoms call us or go to the ER.

## 2019-05-17 NOTE — Addendum Note (Signed)
Addended by: Gordy Levan, Laylanie Kruczek A on: 05/17/2019 04:26 PM   Modules accepted: Orders

## 2019-05-19 NOTE — Telephone Encounter (Signed)
Please check with the lab and ensure they received them. It still shows as "Active" in Epic (versus "Collected - in progress")

## 2019-05-19 NOTE — Telephone Encounter (Signed)
Pt's spouse called to see if the cdiff results were back. They weren't back when I checked this morning. Pt's spouse is so concerned with the back to back bowel movements that are so watery liquid. Spouse is changing pts clothes, bending, moping floors ect. Spouse is aware that the test hasn't come in yet and spouse will keep looking for results on mychart.

## 2019-05-20 ENCOUNTER — Other Ambulatory Visit (INDEPENDENT_AMBULATORY_CARE_PROVIDER_SITE_OTHER): Payer: Self-pay | Admitting: Internal Medicine

## 2019-05-20 MED ORDER — VANCOMYCIN HCL 125 MG PO CAPS: 125.0000 mg | ORAL_CAPSULE | Freq: Four times a day (QID) | ORAL | 2 refills | Status: DC

## 2019-05-20 NOTE — Progress Notes (Signed)
Patient's wife called stating that they had gotten relief she has message from [facility]. 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 He will need therapy for longer than 2 weeks. He is also on Bactrim every day.  His wife is not sure of the reason.  He has been told by physicians at [facility] that he will need to stay on it for 1 year. Prescription for vancomycin 125 mg 4 times a day sent for [redacted] weeks along with 2 refills. I will arrange follow-up with Mathis Dad, NP.  Patient will need longer duration of therapy at a low dose.  He may need either pulse therapy after 2 weeks or tapering doses.

## 2019-05-22 ENCOUNTER — Telehealth: Payer: Self-pay | Admitting: *Deleted

## 2019-05-22 ENCOUNTER — Telehealth: Payer: Self-pay | Admitting: Internal Medicine

## 2019-05-22 DIAGNOSIS — A09 Infectious gastroenteritis and colitis, unspecified: Secondary | ICD-10-CM

## 2019-05-22 LAB — GASTROINTESTINAL PATHOGEN PANEL PCR
C. difficile Tox A/B, PCR: DETECTED — AB
Campylobacter, PCR: NOT DETECTED
Cryptosporidium, PCR: NOT DETECTED
E coli (ETEC) LT/ST PCR: NOT DETECTED
E coli (STEC) stx1/stx2, PCR: NOT DETECTED
E coli 0157, PCR: NOT DETECTED
Giardia lamblia, PCR: NOT DETECTED
Norovirus, PCR: NOT DETECTED
Rotavirus A, PCR: NOT DETECTED
Salmonella, PCR: NOT DETECTED
Shigella, PCR: NOT DETECTED

## 2019-05-22 LAB — C. DIFFICILE GDH AND TOXIN A/B
GDH ANTIGEN: DETECTED
MICRO NUMBER:: 1209646
SPECIMEN QUALITY:: ADEQUATE
TOXIN A AND B: DETECTED

## 2019-05-22 NOTE — Telephone Encounter (Signed)
See results. Showing in progress now

## 2019-05-22 NOTE — Telephone Encounter (Signed)
Received VM from Leslie from [place].  Ref# [id number].  Called back and spoke to [place].  She informed me that C diff was detected in recent lab studies on pt.

## 2019-05-22 NOTE — Telephone Encounter (Signed)
[phone] patient called and wanted to know if we could call him in something for rawness on his bottom.  He is diabetic and wife stated he has sores from the cdiff

## 2019-05-23 MED ORDER — HYDROCORTISONE (PERIANAL) 2.5 % EX CREA: 1.0000 "application " | TOPICAL_CREAM | Freq: Two times a day (BID) | CUTANEOUS | 1 refills | Status: DC

## 2019-05-23 NOTE — Telephone Encounter (Signed)
Noted. See other phone note for results (planned [place])

## 2019-05-23 NOTE — Telephone Encounter (Signed)
I refilled his Anusol. Let me know if this is not effective and we can try compounding it with Lidocaine at [facility].

## 2019-05-23 NOTE — Telephone Encounter (Signed)
I spoke with the pts wife, she said she talked to RMR on Saturday and he called in vancocin 4x a day for 2 weeks and 2x a day for 2 weeks. Pt has ov on 06/13/19 with EG. She said he is still having a lot of watery diarrhea and she is having to clean him a lot but she did notice that he was clean this morning when he got up and he slept all night without having any episodes of diarrhea, so she feels like he may be a little better at this point. She will let us know if she has any problems prior to his ov.   FYI, she informed me that he was having problems with his blood sugar dropping very low. She had to contact EMS on Saturday as well, because he passed out and she couldn't wake him up.  She has contacted his pcp and they have currently stopped all of his DM medications. She said she is aware to try to keep him hydrated and how to care for his diet.

## 2019-05-23 NOTE — Telephone Encounter (Addendum)
Patient spouse aware and states the anusol did not work. She also called PCP and they advised to use a type of thick white cream and he started this yesterday. She will call if this does not work.

## 2019-05-23 NOTE — Addendum Note (Signed)
Addended by: Gordy Levan, Kateleen Encarnacion A on: 05/23/2019 09:46 AM   Modules accepted: Orders

## 2019-05-23 NOTE — Telephone Encounter (Addendum)
Please tell the patient his results just came back (it takes a few days). He has CDiff again (despite treatment). We will try the other antibiotic for CDiff (Dificid). This usually has to go through a specialty pharmacy who should work with his insurance to get it covered. He should hear from them or [place] about coverage.  He needs Dificid 200mg  bid x 10 days. Let me know if an Rx needs to be written (not sure how we send it to the specialty pharmacy)  Call if any questions or concerns.  FYI: ME and AM

## 2019-05-24 NOTE — Telephone Encounter (Signed)
Noted, taper of Vanco and Dificid both approved for first recurrence of CDiff. Continue to monitor clinically, call for any problems.   JL: Please call patient early next week (Monday or Tuesday) and check on him.

## 2019-05-29 ENCOUNTER — Telehealth: Payer: Self-pay | Admitting: Internal Medicine

## 2019-05-29 NOTE — Telephone Encounter (Signed)
Pt's spouse called back. She noticed some bright red blood mixed in pts bowel movements that started over the weekend. It's been seen several times since pt is having lots of bowel movements.  Spouse says pt isn't urinating much. When pt tries to urinate, pt says he can't.  Pt is taking Vancomyic that RMR called in last week. Pt isn't eating much. When eating, pt will eat jello and a yogurt. Pt's spouse says pt has lost 8 lbs over the last 12 days. Pt feels weak, no vomiting to report, tenderness in legs. Please advise in the absence of EG and RMR. I'm not sure if Dificid is suppose to be sent in for pt since he has restarted Vancomycin.

## 2019-05-29 NOTE — Telephone Encounter (Signed)
See other phone note for documentation.  

## 2019-05-29 NOTE — Telephone Encounter (Signed)
Pt's wife called asking to speak with AM. I told her AM was on another call. Please call her at [phone]

## 2019-05-30 NOTE — Telephone Encounter (Signed)
Spoke with pts spouse. Pt will continue his probiotic, had labs drawn tomorrow (orders faxed pts PCP office), progress report in 24-48 hours. Pt will go to the ED if needed.

## 2019-05-30 NOTE — Telephone Encounter (Signed)
Provided Rx with CBC, BMP orders for CDiff diarrhea and hematochezia.   Start a Probiotic as previously mentioned.  Keep an eye on him and go to the ER if needed  Please call in 24-48 hours to check on him.

## 2019-05-30 NOTE — Telephone Encounter (Signed)
I was out of the office yesterday so unable to address this. Forwarding to Randall Hiss who is back in office today.

## 2019-05-30 NOTE — Telephone Encounter (Signed)
Spoke with pts spouse. She is aware of the recommendations. Today pts bowel movements have been dark, almost black. Spouse says the bleeding has come from pts rectum and not his urine. She was still advised to let the PCP know about pts urinary symptoms. Spouse said she talked to pts PCP and they can do blood work, stool samples or anything our office needs. They would rather get the order from our office and go to the PCP since it's less of a drive for pt.

## 2019-05-30 NOTE — Telephone Encounter (Signed)
If the patient is having weakness and significant weight loss due to unable to keep down food/fluids or not eating drinking enough, he may need to go to the ER.  Try to push fluids and at least 3 small meals (as tolerated).  He should start feeling better on the Vancomycin. He can also start a Probiotic (such as Restora, [employer], [employer]...we may have samples or coupons.)  Does he feel like the bleeding is coming from his rectum/bowel movements or is it more urinary? (can check to see if any blood noted when he just urinates). If he is definitely having blood in his stool, can we put in for a STAT CBC?  Regardless he should address urinary symptoms with PCP.  Please cal [place] in 24-48 hours with an update. If he develops worsening abdominal pain, cannot keep down food/fluids, worsening weakness, fever, chills, other concerning symptoms he should go to the ER and he may need further workup or IV hydration.

## 2019-05-31 NOTE — Telephone Encounter (Signed)
Pt's spouse called. Pt woke up and is having severe bleeding within his bowel movements. Pt has clots of blood coming out along with bright red blood per pts spouse. Pt's spouse called pts PCP and they were advised to call EMS and go to [facility]. I also asked pts spouse to call EMS. Spouse agreed that she would call EMS. Spouse is asking for help for her spouse, spouse agreed that she would call EMS.

## 2019-05-31 NOTE — Telephone Encounter (Signed)
Noted  

## 2019-05-31 NOTE — Telephone Encounter (Signed)
FYI, spouse called back to let EG know that EMS took pt to [facility]. Pt requested [facility] and EMS told pt that they received a call from [facility] at 6:30 AM this morning 05/31/2019 asking them not to bring any pts to their hospital today. Spouse has also called pts PCP and informed them.

## 2019-05-31 NOTE — Telephone Encounter (Signed)
Noted. Sounds like te ER was diverting due to being overwhelmed.  We'll see if [facility] transfers him or not based on his consult needs. Will keep an eye out.

## 2019-06-02 DIAGNOSIS — Z87891 Personal history of nicotine dependence: Secondary | ICD-10-CM | POA: Diagnosis not present

## 2019-06-02 DIAGNOSIS — K219 Gastro-esophageal reflux disease without esophagitis: Secondary | ICD-10-CM | POA: Diagnosis not present

## 2019-06-02 DIAGNOSIS — N186 End stage renal disease: Secondary | ICD-10-CM | POA: Diagnosis not present

## 2019-06-02 DIAGNOSIS — N319 Neuromuscular dysfunction of bladder, unspecified: Secondary | ICD-10-CM | POA: Diagnosis not present

## 2019-06-02 DIAGNOSIS — R531 Weakness: Secondary | ICD-10-CM | POA: Diagnosis not present

## 2019-06-02 DIAGNOSIS — R188 Other ascites: Secondary | ICD-10-CM | POA: Diagnosis not present

## 2019-06-02 DIAGNOSIS — N183 Chronic kidney disease, stage 3 unspecified: Secondary | ICD-10-CM | POA: Diagnosis not present

## 2019-06-02 DIAGNOSIS — K31811 Angiodysplasia of stomach and duodenum with bleeding: Secondary | ICD-10-CM | POA: Diagnosis not present

## 2019-06-02 DIAGNOSIS — I8501 Esophageal varices with bleeding: Secondary | ICD-10-CM | POA: Diagnosis not present

## 2019-06-02 DIAGNOSIS — Z794 Long term (current) use of insulin: Secondary | ICD-10-CM | POA: Diagnosis not present

## 2019-06-02 DIAGNOSIS — K31819 Angiodysplasia of stomach and duodenum without bleeding: Secondary | ICD-10-CM | POA: Diagnosis not present

## 2019-06-02 DIAGNOSIS — K7460 Unspecified cirrhosis of liver: Secondary | ICD-10-CM | POA: Diagnosis not present

## 2019-06-02 DIAGNOSIS — R0602 Shortness of breath: Secondary | ICD-10-CM | POA: Diagnosis not present

## 2019-06-02 DIAGNOSIS — Z79899 Other long term (current) drug therapy: Secondary | ICD-10-CM | POA: Diagnosis not present

## 2019-06-02 DIAGNOSIS — J90 Pleural effusion, not elsewhere classified: Secondary | ICD-10-CM | POA: Diagnosis not present

## 2019-06-02 DIAGNOSIS — E119 Type 2 diabetes mellitus without complications: Secondary | ICD-10-CM | POA: Diagnosis not present

## 2019-06-02 DIAGNOSIS — K767 Hepatorenal syndrome: Secondary | ICD-10-CM | POA: Diagnosis not present

## 2019-06-02 DIAGNOSIS — L03116 Cellulitis of left lower limb: Secondary | ICD-10-CM | POA: Diagnosis not present

## 2019-06-02 DIAGNOSIS — K921 Melena: Secondary | ICD-10-CM | POA: Diagnosis not present

## 2019-06-02 DIAGNOSIS — Z20822 Contact with and (suspected) exposure to covid-19: Secondary | ICD-10-CM | POA: Diagnosis not present

## 2019-06-02 DIAGNOSIS — K652 Spontaneous bacterial peritonitis: Secondary | ICD-10-CM | POA: Diagnosis not present

## 2019-06-02 DIAGNOSIS — N179 Acute kidney failure, unspecified: Secondary | ICD-10-CM | POA: Diagnosis not present

## 2019-06-02 DIAGNOSIS — E871 Hypo-osmolality and hyponatremia: Secondary | ICD-10-CM | POA: Diagnosis not present

## 2019-06-02 DIAGNOSIS — I517 Cardiomegaly: Secondary | ICD-10-CM | POA: Diagnosis not present

## 2019-06-02 DIAGNOSIS — I4891 Unspecified atrial fibrillation: Secondary | ICD-10-CM | POA: Diagnosis not present

## 2019-06-02 DIAGNOSIS — D500 Iron deficiency anemia secondary to blood loss (chronic): Secondary | ICD-10-CM | POA: Diagnosis not present

## 2019-06-02 DIAGNOSIS — D631 Anemia in chronic kidney disease: Secondary | ICD-10-CM | POA: Diagnosis not present

## 2019-06-02 DIAGNOSIS — E785 Hyperlipidemia, unspecified: Secondary | ICD-10-CM | POA: Diagnosis not present

## 2019-06-02 DIAGNOSIS — I700 Atherosclerosis of aorta: Secondary | ICD-10-CM | POA: Diagnosis not present

## 2019-06-02 DIAGNOSIS — D638 Anemia in other chronic diseases classified elsewhere: Secondary | ICD-10-CM | POA: Diagnosis not present

## 2019-06-02 DIAGNOSIS — G629 Polyneuropathy, unspecified: Secondary | ICD-10-CM | POA: Diagnosis not present

## 2019-06-02 DIAGNOSIS — I8511 Secondary esophageal varices with bleeding: Secondary | ICD-10-CM | POA: Diagnosis not present

## 2019-06-02 DIAGNOSIS — K6389 Other specified diseases of intestine: Secondary | ICD-10-CM | POA: Diagnosis not present

## 2019-06-02 DIAGNOSIS — D62 Acute posthemorrhagic anemia: Secondary | ICD-10-CM | POA: Diagnosis not present

## 2019-06-02 DIAGNOSIS — M7989 Other specified soft tissue disorders: Secondary | ICD-10-CM | POA: Diagnosis not present

## 2019-06-02 DIAGNOSIS — E1165 Type 2 diabetes mellitus with hyperglycemia: Secondary | ICD-10-CM | POA: Diagnosis not present

## 2019-06-02 DIAGNOSIS — I8510 Secondary esophageal varices without bleeding: Secondary | ICD-10-CM | POA: Diagnosis not present

## 2019-06-02 DIAGNOSIS — E875 Hyperkalemia: Secondary | ICD-10-CM | POA: Diagnosis not present

## 2019-06-02 DIAGNOSIS — K8020 Calculus of gallbladder without cholecystitis without obstruction: Secondary | ICD-10-CM | POA: Diagnosis not present

## 2019-06-02 DIAGNOSIS — E1122 Type 2 diabetes mellitus with diabetic chronic kidney disease: Secondary | ICD-10-CM | POA: Diagnosis not present

## 2019-06-02 DIAGNOSIS — K766 Portal hypertension: Secondary | ICD-10-CM | POA: Diagnosis not present

## 2019-06-02 DIAGNOSIS — R338 Other retention of urine: Secondary | ICD-10-CM | POA: Diagnosis not present

## 2019-06-02 DIAGNOSIS — I129 Hypertensive chronic kidney disease with stage 1 through stage 4 chronic kidney disease, or unspecified chronic kidney disease: Secondary | ICD-10-CM | POA: Diagnosis not present

## 2019-06-02 DIAGNOSIS — N189 Chronic kidney disease, unspecified: Secondary | ICD-10-CM | POA: Diagnosis not present

## 2019-06-02 DIAGNOSIS — Z4901 Encounter for fitting and adjustment of extracorporeal dialysis catheter: Secondary | ICD-10-CM | POA: Diagnosis not present

## 2019-06-02 DIAGNOSIS — N19 Unspecified kidney failure: Secondary | ICD-10-CM | POA: Diagnosis not present

## 2019-06-02 DIAGNOSIS — B3749 Other urogenital candidiasis: Secondary | ICD-10-CM | POA: Diagnosis not present

## 2019-06-02 DIAGNOSIS — A0472 Enterocolitis due to Clostridium difficile, not specified as recurrent: Secondary | ICD-10-CM | POA: Diagnosis not present

## 2019-06-02 DIAGNOSIS — I120 Hypertensive chronic kidney disease with stage 5 chronic kidney disease or end stage renal disease: Secondary | ICD-10-CM | POA: Diagnosis not present

## 2019-06-02 DIAGNOSIS — R918 Other nonspecific abnormal finding of lung field: Secondary | ICD-10-CM | POA: Diagnosis not present

## 2019-06-02 DIAGNOSIS — K922 Gastrointestinal hemorrhage, unspecified: Secondary | ICD-10-CM | POA: Diagnosis not present

## 2019-06-02 DIAGNOSIS — E872 Acidosis: Secondary | ICD-10-CM | POA: Diagnosis not present

## 2019-06-02 HISTORY — PX: COLONOSCOPY WITH ESOPHAGOGASTRODUODENOSCOPY (EGD): SHX5779

## 2019-06-13 ENCOUNTER — Ambulatory Visit: Payer: Medicare Other | Admitting: Nurse Practitioner

## 2019-07-05 ENCOUNTER — Encounter: Payer: Self-pay | Admitting: Internal Medicine

## 2019-07-11 ENCOUNTER — Telehealth: Payer: Self-pay

## 2019-07-11 NOTE — Telephone Encounter (Signed)
FYI, Pt's spouse called and said she received the letter from our office stating that it's time for pt to have another office visit. Pt is currently in the hospital and has been since 05/30/2020. Spouse was advised not to worry about the appointment at this time. When pt gets well, pt can be seen.

## 2019-07-11 NOTE — Telephone Encounter (Signed)
Communication noted.  Agree.

## 2019-07-14 DIAGNOSIS — Z992 Dependence on renal dialysis: Secondary | ICD-10-CM | POA: Diagnosis not present

## 2019-07-14 DIAGNOSIS — N186 End stage renal disease: Secondary | ICD-10-CM | POA: Diagnosis not present

## 2019-07-17 DIAGNOSIS — N186 End stage renal disease: Secondary | ICD-10-CM | POA: Diagnosis not present

## 2019-07-17 DIAGNOSIS — Z992 Dependence on renal dialysis: Secondary | ICD-10-CM | POA: Diagnosis not present

## 2019-07-17 NOTE — Telephone Encounter (Signed)
PA was submitted through [email or web address] for Xifaxan. PA has been approved. Approval letter will be scanned when received.

## 2019-07-19 DIAGNOSIS — N186 End stage renal disease: Secondary | ICD-10-CM | POA: Diagnosis not present

## 2019-07-19 DIAGNOSIS — Z992 Dependence on renal dialysis: Secondary | ICD-10-CM | POA: Diagnosis not present

## 2019-07-21 DIAGNOSIS — Z992 Dependence on renal dialysis: Secondary | ICD-10-CM | POA: Diagnosis not present

## 2019-07-21 DIAGNOSIS — N186 End stage renal disease: Secondary | ICD-10-CM | POA: Diagnosis not present

## 2019-07-24 DIAGNOSIS — N186 End stage renal disease: Secondary | ICD-10-CM | POA: Diagnosis not present

## 2019-07-24 DIAGNOSIS — Z992 Dependence on renal dialysis: Secondary | ICD-10-CM | POA: Diagnosis not present

## 2019-07-26 DIAGNOSIS — N186 End stage renal disease: Secondary | ICD-10-CM | POA: Diagnosis not present

## 2019-07-26 DIAGNOSIS — Z992 Dependence on renal dialysis: Secondary | ICD-10-CM | POA: Diagnosis not present

## 2019-07-26 DIAGNOSIS — N2581 Secondary hyperparathyroidism of renal origin: Secondary | ICD-10-CM | POA: Diagnosis not present

## 2019-07-27 DIAGNOSIS — I10 Essential (primary) hypertension: Secondary | ICD-10-CM | POA: Diagnosis not present

## 2019-07-27 DIAGNOSIS — E1165 Type 2 diabetes mellitus with hyperglycemia: Secondary | ICD-10-CM | POA: Diagnosis not present

## 2019-07-27 DIAGNOSIS — A0472 Enterocolitis due to Clostridium difficile, not specified as recurrent: Secondary | ICD-10-CM | POA: Diagnosis not present

## 2019-07-27 DIAGNOSIS — N183 Chronic kidney disease, stage 3 unspecified: Secondary | ICD-10-CM | POA: Diagnosis not present

## 2019-07-27 DIAGNOSIS — Z789 Other specified health status: Secondary | ICD-10-CM | POA: Diagnosis not present

## 2019-07-27 DIAGNOSIS — Z299 Encounter for prophylactic measures, unspecified: Secondary | ICD-10-CM | POA: Diagnosis not present

## 2019-07-28 DIAGNOSIS — Z992 Dependence on renal dialysis: Secondary | ICD-10-CM | POA: Diagnosis not present

## 2019-07-28 DIAGNOSIS — Z794 Long term (current) use of insulin: Secondary | ICD-10-CM | POA: Diagnosis not present

## 2019-07-28 DIAGNOSIS — E119 Type 2 diabetes mellitus without complications: Secondary | ICD-10-CM | POA: Diagnosis not present

## 2019-07-28 DIAGNOSIS — N186 End stage renal disease: Secondary | ICD-10-CM | POA: Diagnosis not present

## 2019-07-30 DIAGNOSIS — N186 End stage renal disease: Secondary | ICD-10-CM | POA: Diagnosis not present

## 2019-07-30 DIAGNOSIS — Z992 Dependence on renal dialysis: Secondary | ICD-10-CM | POA: Diagnosis not present

## 2019-07-31 DIAGNOSIS — E878 Other disorders of electrolyte and fluid balance, not elsewhere classified: Secondary | ICD-10-CM | POA: Diagnosis not present

## 2019-07-31 DIAGNOSIS — Z23 Encounter for immunization: Secondary | ICD-10-CM | POA: Diagnosis not present

## 2019-07-31 DIAGNOSIS — Z992 Dependence on renal dialysis: Secondary | ICD-10-CM | POA: Diagnosis not present

## 2019-07-31 DIAGNOSIS — N2581 Secondary hyperparathyroidism of renal origin: Secondary | ICD-10-CM | POA: Diagnosis not present

## 2019-07-31 DIAGNOSIS — N186 End stage renal disease: Secondary | ICD-10-CM | POA: Diagnosis not present

## 2019-08-02 DIAGNOSIS — Z23 Encounter for immunization: Secondary | ICD-10-CM | POA: Diagnosis not present

## 2019-08-02 DIAGNOSIS — Z992 Dependence on renal dialysis: Secondary | ICD-10-CM | POA: Diagnosis not present

## 2019-08-02 DIAGNOSIS — N186 End stage renal disease: Secondary | ICD-10-CM | POA: Diagnosis not present

## 2019-08-03 ENCOUNTER — Telehealth (HOSPITAL_COMMUNITY): Payer: Self-pay

## 2019-08-03 NOTE — Telephone Encounter (Signed)

## 2019-08-04 DIAGNOSIS — N186 End stage renal disease: Secondary | ICD-10-CM | POA: Diagnosis not present

## 2019-08-04 DIAGNOSIS — Z23 Encounter for immunization: Secondary | ICD-10-CM | POA: Diagnosis not present

## 2019-08-04 DIAGNOSIS — Z992 Dependence on renal dialysis: Secondary | ICD-10-CM | POA: Diagnosis not present

## 2019-08-07 DIAGNOSIS — N2581 Secondary hyperparathyroidism of renal origin: Secondary | ICD-10-CM | POA: Diagnosis not present

## 2019-08-07 DIAGNOSIS — Z992 Dependence on renal dialysis: Secondary | ICD-10-CM | POA: Diagnosis not present

## 2019-08-07 DIAGNOSIS — N186 End stage renal disease: Secondary | ICD-10-CM | POA: Diagnosis not present

## 2019-08-07 DIAGNOSIS — Z23 Encounter for immunization: Secondary | ICD-10-CM | POA: Diagnosis not present

## 2019-08-07 DIAGNOSIS — E878 Other disorders of electrolyte and fluid balance, not elsewhere classified: Secondary | ICD-10-CM | POA: Diagnosis not present

## 2019-08-08 ENCOUNTER — Ambulatory Visit (INDEPENDENT_AMBULATORY_CARE_PROVIDER_SITE_OTHER): Payer: Medicare Other | Admitting: Vascular Surgery

## 2019-08-08 ENCOUNTER — Other Ambulatory Visit: Payer: Self-pay

## 2019-08-08 ENCOUNTER — Ambulatory Visit (HOSPITAL_COMMUNITY)
Admission: RE | Admit: 2019-08-08 | Discharge: 2019-08-08 | Disposition: A | Discharge status: Home / Self Care | Payer: Medicare Other | Source: Ambulatory Visit | Attending: Vascular Surgery | Admitting: Vascular Surgery

## 2019-08-08 ENCOUNTER — Encounter: Payer: Self-pay | Admitting: Vascular Surgery

## 2019-08-08 ENCOUNTER — Ambulatory Visit (INDEPENDENT_AMBULATORY_CARE_PROVIDER_SITE_OTHER)
Admission: RE | Admit: 2019-08-08 | Discharge: 2019-08-08 | Disposition: A | Discharge status: Home / Self Care | Payer: Medicare Other | Source: Ambulatory Visit | Attending: Vascular Surgery | Admitting: Vascular Surgery

## 2019-08-08 VITALS — BP 150/69 | HR 95 | Temp 97.9°F | Resp 20 | Ht 67.0 in | Wt 233.7 lb

## 2019-08-08 DIAGNOSIS — N189 Chronic kidney disease, unspecified: Secondary | ICD-10-CM | POA: Diagnosis not present

## 2019-08-08 DIAGNOSIS — N186 End stage renal disease: Secondary | ICD-10-CM

## 2019-08-08 DIAGNOSIS — Z992 Dependence on renal dialysis: Secondary | ICD-10-CM

## 2019-08-08 DIAGNOSIS — Z794 Long term (current) use of insulin: Secondary | ICD-10-CM | POA: Diagnosis not present

## 2019-08-08 DIAGNOSIS — E1165 Type 2 diabetes mellitus with hyperglycemia: Secondary | ICD-10-CM | POA: Diagnosis not present

## 2019-08-08 NOTE — H&P (View-Only) (Signed)
[facility] [place]  Patient name: Jeffrey [name] MRN: [id number] DOB: May 03, 1946 Sex: male  REASON FOR CONSULT: Discuss access for hemodialysis  HPI: Jeffrey [name] is a 74 y.o. male, who is today for discussion of hemodialysis access.  He reports that he had 39-month hospitalization in [facility].  I do not have details of this hospitalization.  His wife is also speaking with [place] by telephone due to Covid visitation restrictions.  She reports that his catheter was placed in January.  He is right-handed.  He does not have a pacemaker.  Past Medical History:  Diagnosis Date  . Alcoholic cirrhosis ([place])    patient reports completing hep a and b vaccines in 2006  . Anemia    has had 3 units of prbcs 2013  . Anxiety   . Asymptomatic gallstones    Ultrasound in 2006  . Cholelithiasis   . Chronic kidney disease   . Depression   . Diabetes ([place])   . Duodenal ulcer with hemorrhage    per patient in 2006 or 2007, records have been requested  . 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 HOH (hard of hearing)   . Hypertension   . Splenomegaly    Ultrasound in 2006  . Tubular adenoma of colon     Family History  Problem Relation Age of Onset  . Colon cancer Father 57       deceased age 70  . Breast cancer Sister        deceased  . Breast cancer Sister   . Stroke Mother   . Lung cancer Neg Hx   . Ovarian cancer Neg Hx     SOCIAL HISTORY: Social History   Socioeconomic History  . Marital status: Married    Spouse name: Not on file  . Number of children: 2  . Years of education: Not on file  . Highest education level: Not on file  Occupational History  . Occupation: Disabled    Employer: SELF EMPLOYED  Tobacco Use  . Smoking status: Former Smoker    Packs/day: 0.50    Years: 25.00    Pack years: 12.50    Types: Cigarettes    Quit date: 01/28/1986    Years since  quitting: 33.5  . Smokeless tobacco: Never Used  Substance and Sexual Activity  . Alcohol use: No    Comment: quit in 05/2013  . Drug use: No  . Sexual activity: Not Currently  Other Topics Concern  . Not on file  Social History Narrative   Two step children from second marriage (divorced) who live with him along with some grandchildren.    Social Determinants of Health   Financial Resource Strain:   . Difficulty of Paying Living Expenses: Not on file  Food Insecurity:   . Worried About Charity fundraiser in the Last Year: Not on file  . Ran Out of Food in the Last Year: Not on file  Transportation Needs:   . Lack of Transportation (Medical): Not on file  . Lack of Transportation (Non-Medical): Not on file  Physical Activity:   . Days of Exercise per Week: Not on file  . Minutes of Exercise per Session: Not on file  Stress:   . Feeling of Stress : Not on file  Social Connections:   . Frequency of Communication with Friends and Family: Not on file  .  Frequency of Social Gatherings with Friends and Family: Not on file  . Attends Religious Services: Not on file  . Active Member of Clubs or Organizations: Not on file  . Attends Archivist Meetings: Not on file  . Marital Status: Not on file  Intimate Partner Violence:   . Fear of Current or Ex-Partner: Not on file  . Emotionally Abused: Not on file  . Physically Abused: Not on file  . Sexually Abused: Not on file    No Known Allergies  Current Outpatient Medications  Medication Sig Dispense Refill  . Ascorbic Acid (VITAMIN C) 1000 MG tablet Take 1,000 mg by mouth 2 (two) times daily.    . cholestyramine (QUESTRAN) 4 GM/DOSE powder SMARTSIG:1 Scoopful By Mouth Daily    . Continuous Blood Gluc Sensor (FREESTYLE LIBRE 14 DAY SENSOR) MISC Inject 1 each into the skin every 14 (fourteen) days. Use as directed. 2 each 2  . furosemide (LASIX) 80 MG tablet Take 80 mg by mouth daily.    [name] gabapentin (NEURONTIN) 100 MG  capsule Take 1 capsule by mouth daily.    [name] HYDROcodone-acetaminophen (NORCO/VICODIN) 5-325 MG tablet Take 1 tablet by mouth 2 (two) times daily as needed.    . hydrocortisone (ANUSOL-HC) 2.5 % rectal cream Place 1 application rectally 2 (two) times daily. 30 g 1  . Insulin Degludec 200 UNIT/ML SOPN Inject 120 Units into the skin at bedtime.    . insulin lispro (HUMALOG) 100 UNIT/ML KiwkPen Inject 50 Units into the skin 3 (three) times daily.     . iron polysaccharides (NIFEREX) 150 MG capsule Take 150 mg by mouth at bedtime.     . metoprolol tartrate (LOPRESSOR) 25 MG tablet Take 25 mg by mouth 2 (two) times daily.    . milk thistle 175 MG tablet Take 175 mg by mouth 3 (three) times daily.     . mirtazapine (REMERON) 15 MG tablet Take 15 mg by mouth at bedtime.    . Multiple Vitamin (MULTIVITAMIN WITH MINERALS) TABS tablet Take 1 tablet by mouth daily.    . Omega 3 1000 MG CAPS Take 1 capsule by mouth 2 (two) times daily.     [name] omega-3 acid ethyl esters (LOVAZA) 1 g capsule Take 1 capsule by mouth 2 (two) times daily.     [name] omeprazole (PRILOSEC) 40 MG capsule Take 40 mg by mouth daily.    . pantoprazole (PROTONIX) 20 MG tablet Take 1 tablet (20 mg total) by mouth daily. 30 tablet 0  . pravastatin (PRAVACHOL) 20 MG tablet Take 20 mg by mouth daily.    . rifaximin (XIFAXAN) 550 MG TABS tablet Take 1 tablet (550 mg total) by mouth 2 (two) times daily. 60 tablet 11  . sitaGLIPtin (JANUVIA) 100 MG tablet Take 100 mg by mouth daily.     [name] spironolactone (ALDACTONE) 100 MG tablet Take 50 mg by mouth daily.     . tamsulosin (FLOMAX) 0.4 MG CAPS capsule Take 0.4 mg by mouth at bedtime.     . thiamine 100 MG tablet Take 100 mg by mouth daily. Vitamin B-1    . tiZANidine (ZANAFLEX) 2 MG tablet Take 2 mg by mouth every 6 (six) hours as needed for muscle spasms.     [name] torsemide (DEMADEX) 20 MG tablet Take 60-80 mg by mouth 2 (two) times daily. 80mg  in morning and 60mg  at lunch    . triamcinolone cream  (KENALOG) 0.1 % Apply 1 application topically 2 (two) times  daily as needed for itching.  3  . vancomycin (VANCOCIN) 125 MG capsule Take 1 capsule (125 mg total) by mouth 4 (four) times daily. 60 capsule 2  . venlafaxine (EFFEXOR) 75 MG tablet Take 75 mg by mouth 3 (three) times daily with meals.     . vitamin E 400 UNIT capsule Take 400 Units by mouth 2 (two) times daily.     . Zinc 50 MG TABS Take 1 tablet by mouth daily.    . metroNIDAZOLE (FLAGYL) 500 MG tablet Take 500 mg by mouth 3 (three) times daily.    . Probiotic Product (PROBIOTIC DAILY PO) Take by mouth daily.    . propranolol (INDERAL) 40 MG tablet TAKE 40MG  AT BREAKFAST, 40MG  AT LUNCH, 20 MG AT BEDTIME (Patient not taking: Reported on 05/02/2019) 75 tablet 5  . Turmeric 500 MG TABS Take 500 mg by mouth daily.      No current facility-administered medications for this visit.    REVIEW OF SYSTEMS:  [X]  denotes positive finding, [ ]  denotes negative finding Cardiac  Comments:  Chest pain or chest pressure:    Shortness of breath upon exertion: x   Short of breath when lying flat:    Irregular heart rhythm:        Vascular    Pain in calf, thigh, or hip brought on by ambulation:    Pain in feet at night that wakes you up from your sleep:  x   Blood clot in your veins:    Leg swelling:  x       Pulmonary    Oxygen at home:    Productive cough:     Wheezing:         Neurologic    Sudden weakness in arms or legs:     Sudden numbness in arms or legs:     Sudden onset of difficulty speaking or slurred speech:    Temporary loss of vision in one eye:     Problems with dizziness:         Gastrointestinal    Blood in stool:     Vomited blood:         Genitourinary    Burning when urinating:     Blood in urine:        Psychiatric    Major depression:         Hematologic    Bleeding problems:    Problems with blood clotting too easily:        Skin    Rashes or ulcers:        Constitutional    Fever or chills:       PHYSICAL EXAM: Vitals:   08/08/19 1338  BP: (!) 150/69  Pulse: 95  Resp: 20  Temp: 97.9 F (36.6 C)  SpO2: 99%  Weight: 106 kg  Height: 5\' 7"  (1.702 m)    GENERAL: The patient is a well-nourished male, in no acute distress. The vital signs are documented above. CARDIOVASCULAR: 2-3+ radial and brachial pulses bilaterally.  Small surface veins bilaterally.  Thrombosed forearm basilic vein with some extravasation from prior IV placement. PULMONARY: There is good air exchange  ABDOMEN: Soft and non-tender  MUSCULOSKELETAL: There are no major deformities or cyanosis. NEUROLOGIC: No focal weakness or paresthesias are detected. SKIN: There are no ulcers or rashes noted. PSYCHIATRIC: The patient has a normal affect.  DATA:  Noninvasive arterial studies for both upper extremities revealed normal arterial flow to the radial ulnar level.  Upper extremity vein mapping shows extremely small veins and some thickening and old clot bilaterally.  MEDICAL ISSUES: Had long discussion with the patient and his wife by telephone.  Explained options for hemodialysis to include AV fistula and AV graft.  Have recommended a left AV fistula versus graft.  Explained the near certain eventual thromboses of AV graft requiring further treatment.  He in all likelihood will not be a candidate for fistula but will make this final determination with repeat SonoSite at the time of surgery.  We will schedule this for a nondialysis day on a Tuesday or Thursday at his earliest convenience.  Of note he has a glucose monitoring device on his left upper arm.  Explained that this would have to be moved to his right arm prior to graft placement   [name], MD [facility] Tel [phone] Pager [phone]

## 2019-08-08 NOTE — Progress Notes (Signed)
Vascular and Vein Specialist of [place]  Patient name: Jeffrey [name] MRN: [id number] DOB: [birth year] Sex: male  REASON FOR CONSULT: Discuss access for hemodialysis  HPI: Jeffrey [name] is a 74 y.o. male, who is today for discussion of hemodialysis access.  He reports that he had 50-month hospitalization in [facility].  I do not have details of this hospitalization.  His wife is also speaking with [place] by telephone due to Covid visitation restrictions.  She reports that his catheter was placed in January.  He is right-handed.  He does not have a pacemaker.  Past Medical History:  Diagnosis Date  . Alcoholic cirrhosis ([place])    patient reports completing hep a and b vaccines in 2006  . Anemia    has had 3 units of prbcs 2013  . Anxiety   . Asymptomatic gallstones    Ultrasound in 2006  . Cholelithiasis   . Chronic kidney disease   . Depression   . Diabetes ([place])   . Duodenal ulcer with hemorrhage    per patient in 2006 or 2007, records have been requested  . 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 HOH (hard of hearing)   . Hypertension   . Splenomegaly    Ultrasound in 2006  . Tubular adenoma of colon     Family History  Problem Relation Age of Onset  . Colon cancer Father 55       deceased age 57  . Breast cancer Sister        deceased  . Breast cancer Sister   . Stroke Mother   . Lung cancer Neg Hx   . Ovarian cancer Neg Hx     SOCIAL HISTORY: Social History   Socioeconomic History  . Marital status: Married    Spouse name: Not on file  . Number of children: 2  . Years of education: Not on file  . Highest education level: Not on file  Occupational History  . Occupation: Disabled    Employer: SELF EMPLOYED  Tobacco Use  . Smoking status: Former Smoker    Packs/day: 0.50    Years: 25.00    Pack years: 12.50    Types: Cigarettes    Quit date: 01/28/1986    Years since  quitting: 33.5  . Smokeless tobacco: Never Used  Substance and Sexual Activity  . Alcohol use: No    Comment: quit in 05/2013  . Drug use: No  . Sexual activity: Not Currently  Other Topics Concern  . Not on file  Social History Narrative   Two step children from second marriage (divorced) who live with him along with some grandchildren.    Social Determinants of Health   Financial Resource Strain:   . Difficulty of Paying Living Expenses: Not on file  Food Insecurity:   . Worried About Charity fundraiser in the Last Year: Not on file  . Ran Out of Food in the Last Year: Not on file  Transportation Needs:   . Lack of Transportation (Medical): Not on file  . Lack of Transportation (Non-Medical): Not on file  Physical Activity:   . Days of Exercise per Week: Not on file  . Minutes of Exercise per Session: Not on file  Stress:   . Feeling of Stress : Not on file  Social Connections:   . Frequency of Communication with Friends and Family: Not on file  .  Frequency of Social Gatherings with Friends and Family: Not on file  . Attends Religious Services: Not on file  . Active Member of Clubs or Organizations: Not on file  . Attends Archivist Meetings: Not on file  . Marital Status: Not on file  Intimate Partner Violence:   . Fear of Current or Ex-Partner: Not on file  . Emotionally Abused: Not on file  . Physically Abused: Not on file  . Sexually Abused: Not on file    No Known Allergies  Current Outpatient Medications  Medication Sig Dispense Refill  . Ascorbic Acid (VITAMIN C) 1000 MG tablet Take 1,000 mg by mouth 2 (two) times daily.    . cholestyramine (QUESTRAN) 4 GM/DOSE powder SMARTSIG:1 Scoopful By Mouth Daily    . Continuous Blood Gluc Sensor (FREESTYLE LIBRE 14 DAY SENSOR) MISC Inject 1 each into the skin every 14 (fourteen) days. Use as directed. 2 each 2  . furosemide (LASIX) 80 MG tablet Take 80 mg by mouth daily.    [name] gabapentin (NEURONTIN) 100 MG  capsule Take 1 capsule by mouth daily.    [name] HYDROcodone-acetaminophen (NORCO/VICODIN) 5-325 MG tablet Take 1 tablet by mouth 2 (two) times daily as needed.    . hydrocortisone (ANUSOL-HC) 2.5 % rectal cream Place 1 application rectally 2 (two) times daily. 30 g 1  . Insulin Degludec 200 UNIT/ML SOPN Inject 120 Units into the skin at bedtime.    . insulin lispro (HUMALOG) 100 UNIT/ML KiwkPen Inject 50 Units into the skin 3 (three) times daily.     . iron polysaccharides (NIFEREX) 150 MG capsule Take 150 mg by mouth at bedtime.     . metoprolol tartrate (LOPRESSOR) 25 MG tablet Take 25 mg by mouth 2 (two) times daily.    . milk thistle 175 MG tablet Take 175 mg by mouth 3 (three) times daily.     . mirtazapine (REMERON) 15 MG tablet Take 15 mg by mouth at bedtime.    . Multiple Vitamin (MULTIVITAMIN WITH MINERALS) TABS tablet Take 1 tablet by mouth daily.    . Omega 3 1000 MG CAPS Take 1 capsule by mouth 2 (two) times daily.     [name] omega-3 acid ethyl esters (LOVAZA) 1 g capsule Take 1 capsule by mouth 2 (two) times daily.     [name] omeprazole (PRILOSEC) 40 MG capsule Take 40 mg by mouth daily.    . pantoprazole (PROTONIX) 20 MG tablet Take 1 tablet (20 mg total) by mouth daily. 30 tablet 0  . pravastatin (PRAVACHOL) 20 MG tablet Take 20 mg by mouth daily.    . rifaximin (XIFAXAN) 550 MG TABS tablet Take 1 tablet (550 mg total) by mouth 2 (two) times daily. 60 tablet 11  . sitaGLIPtin (JANUVIA) 100 MG tablet Take 100 mg by mouth daily.     [name] spironolactone (ALDACTONE) 100 MG tablet Take 50 mg by mouth daily.     . tamsulosin (FLOMAX) 0.4 MG CAPS capsule Take 0.4 mg by mouth at bedtime.     . thiamine 100 MG tablet Take 100 mg by mouth daily. Vitamin B-1    . tiZANidine (ZANAFLEX) 2 MG tablet Take 2 mg by mouth every 6 (six) hours as needed for muscle spasms.     [name] torsemide (DEMADEX) 20 MG tablet Take 60-80 mg by mouth 2 (two) times daily. 80mg  in morning and 60mg  at lunch    . triamcinolone cream  (KENALOG) 0.1 % Apply 1 application topically 2 (two) times  daily as needed for itching.  3  . vancomycin (VANCOCIN) 125 MG capsule Take 1 capsule (125 mg total) by mouth 4 (four) times daily. 60 capsule 2  . venlafaxine (EFFEXOR) 75 MG tablet Take 75 mg by mouth 3 (three) times daily with meals.     . vitamin E 400 UNIT capsule Take 400 Units by mouth 2 (two) times daily.     . Zinc 50 MG TABS Take 1 tablet by mouth daily.    . metroNIDAZOLE (FLAGYL) 500 MG tablet Take 500 mg by mouth 3 (three) times daily.    . Probiotic Product (PROBIOTIC DAILY PO) Take by mouth daily.    . propranolol (INDERAL) 40 MG tablet TAKE 40MG  AT BREAKFAST, 40MG  AT LUNCH, 20 MG AT BEDTIME (Patient not taking: Reported on 05/02/2019) 75 tablet 5  . Turmeric 500 MG TABS Take 500 mg by mouth daily.      No current facility-administered medications for this visit.    REVIEW OF SYSTEMS:  [X]  denotes positive finding, [ ]  denotes negative finding Cardiac  Comments:  Chest pain or chest pressure:    Shortness of breath upon exertion: x   Short of breath when lying flat:    Irregular heart rhythm:        Vascular    Pain in calf, thigh, or hip brought on by ambulation:    Pain in feet at night that wakes you up from your sleep:  x   Blood clot in your veins:    Leg swelling:  x       Pulmonary    Oxygen at home:    Productive cough:     Wheezing:         Neurologic    Sudden weakness in arms or legs:     Sudden numbness in arms or legs:     Sudden onset of difficulty speaking or slurred speech:    Temporary loss of vision in one eye:     Problems with dizziness:         Gastrointestinal    Blood in stool:     Vomited blood:         Genitourinary    Burning when urinating:     Blood in urine:        Psychiatric    Major depression:         Hematologic    Bleeding problems:    Problems with blood clotting too easily:        Skin    Rashes or ulcers:        Constitutional    Fever or chills:       PHYSICAL EXAM: Vitals:   08/08/19 1338  BP: (!) 150/69  Pulse: 95  Resp: 20  Temp: 97.9 F (36.6 C)  SpO2: 99%  Weight: 106 kg  Height: 5\' 7"  (1.702 m)    GENERAL: The patient is a well-nourished male, in no acute distress. The vital signs are documented above. CARDIOVASCULAR: 2-3+ radial and brachial pulses bilaterally.  Small surface veins bilaterally.  Thrombosed forearm basilic vein with some extravasation from prior IV placement. PULMONARY: There is good air exchange  ABDOMEN: Soft and non-tender  MUSCULOSKELETAL: There are no major deformities or cyanosis. NEUROLOGIC: No focal weakness or paresthesias are detected. SKIN: There are no ulcers or rashes noted. PSYCHIATRIC: The patient has a normal affect.  DATA:  Noninvasive arterial studies for both upper extremities revealed normal arterial flow to the radial ulnar level.  Upper extremity vein mapping shows extremely small veins and some thickening and old clot bilaterally.  MEDICAL ISSUES: Had long discussion with the patient and his wife by telephone.  Explained options for hemodialysis to include AV fistula and AV graft.  Have recommended a left AV fistula versus graft.  Explained the near certain eventual thromboses of AV graft requiring further treatment.  He in all likelihood will not be a candidate for fistula but will make this final determination with repeat SonoSite at the time of surgery.  We will schedule this for a nondialysis day on a Tuesday or Thursday at his earliest convenience.  Of note he has a glucose monitoring device on his left upper arm.  Explained that this would have to be moved to his right arm prior to graft placement   [name], MD [facility] Vascular and Vein Specialists of [facility] Tel [phone] Pager [phone]

## 2019-08-09 DIAGNOSIS — Z992 Dependence on renal dialysis: Secondary | ICD-10-CM | POA: Diagnosis not present

## 2019-08-09 DIAGNOSIS — N186 End stage renal disease: Secondary | ICD-10-CM | POA: Diagnosis not present

## 2019-08-09 DIAGNOSIS — Z23 Encounter for immunization: Secondary | ICD-10-CM | POA: Diagnosis not present

## 2019-08-10 DIAGNOSIS — I10 Essential (primary) hypertension: Secondary | ICD-10-CM | POA: Diagnosis not present

## 2019-08-10 DIAGNOSIS — R339 Retention of urine, unspecified: Secondary | ICD-10-CM | POA: Diagnosis not present

## 2019-08-10 DIAGNOSIS — Z789 Other specified health status: Secondary | ICD-10-CM | POA: Diagnosis not present

## 2019-08-10 DIAGNOSIS — D692 Other nonthrombocytopenic purpura: Secondary | ICD-10-CM | POA: Diagnosis not present

## 2019-08-10 DIAGNOSIS — Z299 Encounter for prophylactic measures, unspecified: Secondary | ICD-10-CM | POA: Diagnosis not present

## 2019-08-10 DIAGNOSIS — E1122 Type 2 diabetes mellitus with diabetic chronic kidney disease: Secondary | ICD-10-CM | POA: Diagnosis not present

## 2019-08-11 DIAGNOSIS — N186 End stage renal disease: Secondary | ICD-10-CM | POA: Diagnosis not present

## 2019-08-11 DIAGNOSIS — Z23 Encounter for immunization: Secondary | ICD-10-CM | POA: Diagnosis not present

## 2019-08-11 DIAGNOSIS — Z992 Dependence on renal dialysis: Secondary | ICD-10-CM | POA: Diagnosis not present

## 2019-08-14 DIAGNOSIS — Z992 Dependence on renal dialysis: Secondary | ICD-10-CM | POA: Diagnosis not present

## 2019-08-14 DIAGNOSIS — Z23 Encounter for immunization: Secondary | ICD-10-CM | POA: Diagnosis not present

## 2019-08-14 DIAGNOSIS — E878 Other disorders of electrolyte and fluid balance, not elsewhere classified: Secondary | ICD-10-CM | POA: Diagnosis not present

## 2019-08-14 DIAGNOSIS — N186 End stage renal disease: Secondary | ICD-10-CM | POA: Diagnosis not present

## 2019-08-15 ENCOUNTER — Other Ambulatory Visit (HOSPITAL_COMMUNITY)
Admission: RE | Admit: 2019-08-15 | Discharge: 2019-08-15 | Disposition: A | Discharge status: Home / Self Care | Payer: Medicare Other | Source: Ambulatory Visit | Attending: Vascular Surgery | Admitting: Vascular Surgery

## 2019-08-15 ENCOUNTER — Other Ambulatory Visit: Payer: Self-pay

## 2019-08-15 DIAGNOSIS — Z20822 Contact with and (suspected) exposure to covid-19: Secondary | ICD-10-CM | POA: Insufficient documentation

## 2019-08-15 DIAGNOSIS — Z01812 Encounter for preprocedural laboratory examination: Secondary | ICD-10-CM | POA: Diagnosis not present

## 2019-08-15 LAB — SARS CORONAVIRUS 2 (TAT 6-24 HRS): SARS Coronavirus 2: NEGATIVE

## 2019-08-15 NOTE — Progress Notes (Signed)
I called Mr Jeffrey [name], Mrs. Jeffrey [name] answered the phone; wife said that she is at work and will not be home until after 7:PM.  Mr. Jeffrey [name] is [facility] and Mrs. Jeffrey [name] answers for Mr. Jeffrey [name]. Mrs Jeffrey [name] said to call her before 1100 in the morning (08/16/2019) , Mr. Jeffrey [name] will be with his wife and can give you permission to speak to her.   I instructed Mrs. Jeffrey [name] to have patient to not take Januvia 08/16/2019 or 08/17/2019, Mrs. Jeffrey [name] repeated information back to me.

## 2019-08-16 ENCOUNTER — Encounter (HOSPITAL_COMMUNITY): Payer: Self-pay | Admitting: Vascular Surgery

## 2019-08-16 ENCOUNTER — Other Ambulatory Visit: Payer: Self-pay

## 2019-08-16 DIAGNOSIS — Z23 Encounter for immunization: Secondary | ICD-10-CM | POA: Diagnosis not present

## 2019-08-16 DIAGNOSIS — Z992 Dependence on renal dialysis: Secondary | ICD-10-CM | POA: Diagnosis not present

## 2019-08-16 DIAGNOSIS — N186 End stage renal disease: Secondary | ICD-10-CM | POA: Diagnosis not present

## 2019-08-16 NOTE — Progress Notes (Signed)
Nurse requested EKG tracing from [facility]; awaiting response.

## 2019-08-16 NOTE — Progress Notes (Signed)
SDW-pre-op call completed by both pt and spouse as requested by pt. Pt denies SOB and chest pain. Spouse denies that pt is under the care of a cardiologist . Spouse stated that when pt started dialysis in January 2021 at [facility], " they said he had A Fib but I did not hear anything after that. " nurse requested EKG tracing, Discharge Summary and any cardiac studies from [facility]; awaiting response. Spouse denies that pt had a stress test, echo and cardiac cath. Spouse denies that pt had a chest x ray in the last year. Spouse made aware to have pt stop taking vitamins, fish oil (Lovaza), Milk Thistle and herbal medications. Do not take any NSAIDs ie: Ibuprofen, Advil, Naproxen (Aleve), Motrin, BC and Goody Powder. Spouse made aware to have pt take 60 units of Tresiba insulin tonight. Spouse made aware to have pt hold Januvia DOS. Spouse made aware to have pt check CBG every 2 hours prior to arrival to hospital on DOS. Pt made aware to treat a CBG < 70 with 4 ounces of apple or cranberry juice, wait 15 minutes after intervention to recheck BBG, if BG remains < 70, call Short Stay unit to speak with a nurse.  Spouse made aware to have pt quarantine. Spouse verbalized understanding of all pre-op instructions.

## 2019-08-17 ENCOUNTER — Encounter (HOSPITAL_COMMUNITY)
Admission: RE | Disposition: A | Discharge status: Home / Self Care | Payer: Self-pay | Source: Home / Self Care | Attending: Vascular Surgery

## 2019-08-17 ENCOUNTER — Ambulatory Visit (HOSPITAL_COMMUNITY): Payer: Medicare Other | Admitting: Certified Registered"

## 2019-08-17 ENCOUNTER — Encounter (HOSPITAL_COMMUNITY): Payer: Self-pay | Admitting: Vascular Surgery

## 2019-08-17 ENCOUNTER — Ambulatory Visit (HOSPITAL_COMMUNITY)
Admission: RE | Admit: 2019-08-17 | Discharge: 2019-08-17 | Disposition: A | Discharge status: Home / Self Care | Payer: Medicare Other | Attending: Vascular Surgery | Admitting: Vascular Surgery

## 2019-08-17 ENCOUNTER — Other Ambulatory Visit: Payer: Self-pay

## 2019-08-17 DIAGNOSIS — Z87891 Personal history of nicotine dependence: Secondary | ICD-10-CM | POA: Diagnosis not present

## 2019-08-17 DIAGNOSIS — I129 Hypertensive chronic kidney disease with stage 1 through stage 4 chronic kidney disease, or unspecified chronic kidney disease: Secondary | ICD-10-CM | POA: Diagnosis not present

## 2019-08-17 DIAGNOSIS — I120 Hypertensive chronic kidney disease with stage 5 chronic kidney disease or end stage renal disease: Secondary | ICD-10-CM | POA: Diagnosis not present

## 2019-08-17 DIAGNOSIS — F329 Major depressive disorder, single episode, unspecified: Secondary | ICD-10-CM | POA: Diagnosis not present

## 2019-08-17 DIAGNOSIS — N189 Chronic kidney disease, unspecified: Secondary | ICD-10-CM | POA: Insufficient documentation

## 2019-08-17 DIAGNOSIS — E1122 Type 2 diabetes mellitus with diabetic chronic kidney disease: Secondary | ICD-10-CM | POA: Diagnosis not present

## 2019-08-17 DIAGNOSIS — H9190 Unspecified hearing loss, unspecified ear: Secondary | ICD-10-CM | POA: Insufficient documentation

## 2019-08-17 DIAGNOSIS — N185 Chronic kidney disease, stage 5: Secondary | ICD-10-CM | POA: Diagnosis not present

## 2019-08-17 DIAGNOSIS — Z794 Long term (current) use of insulin: Secondary | ICD-10-CM | POA: Insufficient documentation

## 2019-08-17 DIAGNOSIS — F419 Anxiety disorder, unspecified: Secondary | ICD-10-CM | POA: Insufficient documentation

## 2019-08-17 DIAGNOSIS — Z79899 Other long term (current) drug therapy: Secondary | ICD-10-CM | POA: Diagnosis not present

## 2019-08-17 DIAGNOSIS — N186 End stage renal disease: Secondary | ICD-10-CM | POA: Diagnosis not present

## 2019-08-17 HISTORY — PX: AV FISTULA PLACEMENT: SHX1204

## 2019-08-17 HISTORY — DX: Unspecified atrial fibrillation: I48.91

## 2019-08-17 LAB — POCT I-STAT, CHEM 8
BUN: 16 mg/dL (ref 8–23)
Calcium, Ion: 1.19 mmol/L (ref 1.15–1.40)
Chloride: 98 mmol/L (ref 98–111)
Creatinine, Ser: 2.7 mg/dL — ABNORMAL HIGH (ref 0.61–1.24)
Glucose, Bld: 56 mg/dL — ABNORMAL LOW (ref 70–99)
HCT: 21 % — ABNORMAL LOW (ref 39.0–52.0)
Hemoglobin: 7.1 g/dL — ABNORMAL LOW (ref 13.0–17.0)
Potassium: 3.6 mmol/L (ref 3.5–5.1)
Sodium: 136 mmol/L (ref 135–145)
TCO2: 28 mmol/L (ref 22–32)

## 2019-08-17 LAB — ABO/RH: ABO/RH(D): O POS

## 2019-08-17 LAB — GLUCOSE, CAPILLARY
Glucose-Capillary: 66 mg/dL — ABNORMAL LOW (ref 70–99)
Glucose-Capillary: 80 mg/dL (ref 70–99)
Glucose-Capillary: 59 mg/dL — ABNORMAL LOW (ref 70–99)
Glucose-Capillary: 73 mg/dL (ref 70–99)

## 2019-08-17 LAB — TYPE AND SCREEN
ABO/RH(D): O POS
Antibody Screen: NEGATIVE

## 2019-08-17 SURGERY — ARTERIOVENOUS (AV) FISTULA CREATION
Anesthesia: Monitor Anesthesia Care | Laterality: Left

## 2019-08-17 MED ORDER — LIDOCAINE-EPINEPHRINE 1 %-1:100000 IJ SOLN
INTRAMUSCULAR | Status: DC | PRN
  Administered 2019-08-17: 11 mL via INTRADERMAL

## 2019-08-17 MED ORDER — CHLORHEXIDINE GLUCONATE 4 % EX LIQD: 60.0000 mL | Freq: Once | CUTANEOUS | Status: DC

## 2019-08-17 MED ORDER — OXYCODONE-ACETAMINOPHEN 5-325 MG PO TABS: 1.0000 | ORAL_TABLET | Freq: Four times a day (QID) | ORAL | 0 refills | Status: DC | PRN

## 2019-08-17 MED ORDER — LIDOCAINE-EPINEPHRINE 1 %-1:100000 IJ SOLN
INTRAMUSCULAR | Status: AC
  Filled 2019-08-17: qty 1

## 2019-08-17 MED ORDER — SODIUM CHLORIDE 0.9 % IV SOLN
INTRAVENOUS | Status: DC | PRN
  Administered 2019-08-17: 500 mL

## 2019-08-17 MED ORDER — EPINEPHRINE PF 1 MG/ML IJ SOLN
INTRAMUSCULAR | Status: AC
  Filled 2019-08-17: qty 1

## 2019-08-17 MED ORDER — DEXTROSE 50 % IV SOLN
INTRAVENOUS | Status: AC
  Administered 2019-08-17: 12.5 g via INTRAVENOUS
  Filled 2019-08-17: qty 50

## 2019-08-17 MED ORDER — MIDAZOLAM HCL 2 MG/2ML IJ SOLN
INTRAMUSCULAR | Status: AC
  Filled 2019-08-17: qty 2

## 2019-08-17 MED ORDER — DEXTROSE 50 % IV SOLN
12.5000 g | INTRAVENOUS | Status: AC
  Filled 2019-08-17: qty 50

## 2019-08-17 MED ORDER — FENTANYL CITRATE (PF) 250 MCG/5ML IJ SOLN
INTRAMUSCULAR | Status: DC | PRN
  Administered 2019-08-17: 25 ug via INTRAVENOUS

## 2019-08-17 MED ORDER — SODIUM CHLORIDE 0.9 % IV SOLN: INTRAVENOUS | Status: DC

## 2019-08-17 MED ORDER — 0.9 % SODIUM CHLORIDE (POUR BTL) OPTIME
TOPICAL | Status: DC | PRN
  Administered 2019-08-17: 1000 mL

## 2019-08-17 MED ORDER — FENTANYL CITRATE (PF) 250 MCG/5ML IJ SOLN
INTRAMUSCULAR | Status: AC
  Filled 2019-08-17: qty 5

## 2019-08-17 MED ORDER — HEPARIN SODIUM (PORCINE) 1000 UNIT/ML IJ SOLN
1.6000 mL | Freq: Once | INTRAMUSCULAR | Status: AC
  Administered 2019-08-17: 1600 [IU]

## 2019-08-17 MED ORDER — CEFAZOLIN SODIUM-DEXTROSE 2-4 GM/100ML-% IV SOLN
2.0000 g | INTRAVENOUS | Status: AC
  Administered 2019-08-17: 2 g via INTRAVENOUS
  Filled 2019-08-17: qty 100

## 2019-08-17 MED ORDER — SODIUM CHLORIDE 0.9 % IV SOLN
INTRAVENOUS | Status: AC
  Filled 2019-08-17: qty 1.2

## 2019-08-17 MED ORDER — LIDOCAINE HCL (PF) 0.5 % IJ SOLN
INTRAMUSCULAR | Status: AC
  Filled 2019-08-17: qty 50

## 2019-08-17 MED ORDER — DEXTROSE 50 % IV SOLN
INTRAVENOUS | Status: DC | PRN
  Administered 2019-08-17: 12.5 mL via INTRAVENOUS

## 2019-08-17 MED ORDER — PROPOFOL 10 MG/ML IV BOLUS
INTRAVENOUS | Status: AC
  Filled 2019-08-17: qty 20

## 2019-08-17 MED ORDER — PROPOFOL 500 MG/50ML IV EMUL
INTRAVENOUS | Status: DC | PRN
  Administered 2019-08-17: 50 ug/kg/min via INTRAVENOUS

## 2019-08-17 SURGICAL SUPPLY — 23 items
ARMBAND PINK RESTRICT EXTREMIT (MISCELLANEOUS) ×4 IMPLANT
CANISTER SUCT 3000ML PPV (MISCELLANEOUS) ×2 IMPLANT
CANNULA VESSEL 3MM 2 BLNT TIP (CANNULA) ×2 IMPLANT
CLIP LIGATING EXTRA MED SLVR (CLIP) ×2 IMPLANT
CLIP LIGATING EXTRA SM BLUE (MISCELLANEOUS) ×2 IMPLANT
COVER PROBE W GEL 5X96 (DRAPES) ×2 IMPLANT
DECANTER SPIKE VIAL GLASS SM (MISCELLANEOUS) ×2 IMPLANT
ELECT REM PT RETURN 9FT ADLT (ELECTROSURGICAL) ×2
ELECTRODE REM PT RTRN 9FT ADLT (ELECTROSURGICAL) ×1 IMPLANT
GLOVE SS BIOGEL STRL SZ 7.5 (GLOVE) ×1 IMPLANT
GLOVE SUPERSENSE BIOGEL SZ 7.5 (GLOVE) ×1
GOWN STRL REUS W/ TWL LRG LVL3 (GOWN DISPOSABLE) ×3 IMPLANT
GOWN STRL REUS W/TWL LRG LVL3 (GOWN DISPOSABLE) ×6
KIT BASIN OR (CUSTOM PROCEDURE TRAY) ×2 IMPLANT
KIT TURNOVER KIT B (KITS) ×2 IMPLANT
NS IRRIG 1000ML POUR BTL (IV SOLUTION) ×2 IMPLANT
PACK CV ACCESS (CUSTOM PROCEDURE TRAY) ×2 IMPLANT
SUT PROLENE 6 0 CC (SUTURE) ×3 IMPLANT
SUT VIC AB 3-0 SH 27 (SUTURE) ×2
SUT VIC AB 3-0 SH 27X BRD (SUTURE) ×1 IMPLANT
TOWEL GREEN STERILE (TOWEL DISPOSABLE) ×2 IMPLANT
UNDERPAD 30X30 (UNDERPADS AND DIAPERS) ×2 IMPLANT
WATER STERILE IRR 1000ML POUR (IV SOLUTION) ×2 IMPLANT

## 2019-08-17 NOTE — Discharge Instructions (Signed)
° °  Vascular and Vein Specialists of  ° °Discharge Instructions ° °AV Fistula or Graft Surgery for Dialysis Access ° °Please refer to the following instructions for your post-procedure care. Your surgeon or physician assistant will discuss any changes with you. ° °Activity ° °You may drive the day following your surgery, if you are comfortable and no longer taking prescription pain medication. Resume full activity as the soreness in your incision resolves. ° °Bathing/Showering ° °You may shower after you go home. Keep your incision dry for 48 hours. Do not soak in a bathtub, hot tub, or swim until the incision heals completely. You may not shower if you have a hemodialysis catheter. ° °Incision Care ° °Clean your incision with mild soap and water after 48 hours. Pat the area dry with a clean towel. You do not need a bandage unless otherwise instructed. Do not apply any ointments or creams to your incision. You may have skin glue on your incision. Do not peel it off. It will come off on its own in about one week. Your arm may swell a bit after surgery. To reduce swelling use pillows to elevate your arm so it is above your heart. Your doctor will tell you if you need to lightly wrap your arm with an ACE bandage. ° °Diet ° °Resume your normal diet. There are not special food restrictions following this procedure. In order to heal from your surgery, it is CRITICAL to get adequate nutrition. Your body requires vitamins, minerals, and protein. Vegetables are the best source of vitamins and minerals. Vegetables also provide the perfect balance of protein. Processed food has little nutritional value, so try to avoid this. ° °Medications ° °Resume taking all of your medications. If your incision is causing pain, you may take over-the counter pain relievers such as acetaminophen (Tylenol). If you were prescribed a stronger pain medication, please be aware these medications can cause nausea and constipation. Prevent  nausea by taking the medication with a snack or meal. Avoid constipation by drinking plenty of fluids and eating foods with high amount of fiber, such as fruits, vegetables, and grains. Do not take Tylenol if you are taking prescription pain medications. ° ° ° ° °Follow up °Your surgeon may want to see you in the office following your access surgery. If so, this will be arranged at the time of your surgery. ° °Please call us immediately for any of the following conditions: ° °Increased pain, redness, drainage (pus) from your incision site °Fever of 101 degrees or higher °Severe or worsening pain at your incision site °Hand pain or numbness. ° °Reduce your risk of vascular disease: ° °Stop smoking. If you would like help, call [facility] at 1-800-QUIT-NOW (1-800-784-8669) or [place] at 336-586-4000 ° °Manage your cholesterol °Maintain a desired weight °Control your diabetes °Keep your blood pressure down ° °Dialysis ° °It will take several weeks to several months for your new dialysis access to be ready for use. Your surgeon will determine when it is OK to use it. Your nephrologist will continue to direct your dialysis. You can continue to use your Permcath until your new access is ready for use. ° °If you have any questions, please call the office at [phone]. ° °

## 2019-08-17 NOTE — Op Note (Signed)
    OPERATIVE REPORT  DATE OF SURGERY: 08/17/2019  PATIENT: Jeffrey [name], 74 y.o. male MRN: [id number]  DOB: 05-15-1946  PRE-OPERATIVE DIAGNOSIS: Renal insufficiency  POST-OPERATIVE DIAGNOSIS:  Same  PROCEDURE: Left first stage brachiocephalic AV fistula creation  SURGEON:  Curt Jews, M.D.  PHYSICIAN ASSISTANT: Nurse  ANESTHESIA: Local with sedation  EBL: per anesthesia record  Total I/O In: -  Out: 170 [Urine:150; Blood:20]  BLOOD ADMINISTERED: none  DRAINS: none  SPECIMEN: none  COUNTS CORRECT:  YES  PATIENT DISPOSITION:  PACU - hemodynamically stable  PROCEDURE DETAILS: Patient was taken operating placed supine additionally the area of the left arm prepped draped you sterile fashion.  SonoSite ultrasound was used to visualize the veins.  The patient had a moderate size cephalic vein at the antecubital space and increasingly large size above the antecubital space.  It was felt that his best option would be for stage basilic vein fistula.  Using local anesthesia incision made over the basilic vein on the medial aspect of the arm.  The vein was of good caliber.  Tributary branches were ligated with 3-0 and 4-0 silk ties and divided.  Vein was mobilized above the antecubital space.  Next a separate incision was made over the brachial artery.  The patient had a good sized artery with minimal atherosclerotic change.  The vein was ligated distally and a short tunnel was created between the level of the basilic vein above the elbow to the brachial artery at the antecubital space.  The artery was occluded proximally and distally and was opened with 11 blade sent longstanding with Potts scissors.  The vein was cut to the appropriate length and was spatulated and sewn end-to-side to the artery with a running 6-0 Prolene suture.  Clamps removed and excellent thrills were noted.  Wounds irrigated with saline.  Hemostasis to electrocautery.  Wound was closed with 3-0 Vicryl in the  subcutaneous and subcuticular tissue.  Sterile dressing was applied and the patient was transferred to the recovery room in stable condition   [name], M.D., [facility] 08/17/2019 9:37 AM

## 2019-08-17 NOTE — Anesthesia Preprocedure Evaluation (Addendum)
Anesthesia Evaluation  Patient identified by MRN, date of birth, ID band Patient awake    Reviewed: Allergy & Precautions, NPO status , Patient's Chart, lab work & pertinent test results  History of Anesthesia Complications Negative for: history of anesthetic complications  Airway Mallampati: II  TM Distance: >3 FB Neck ROM: Full    Dental  (+) Dental Advisory Given   Pulmonary neg recent URI, former smoker,    breath sounds clear to auscultation       Cardiovascular hypertension, Pt. on medications (-) angina(-) Past MI  Rhythm:Regular  Nl ef 2020   Neuro/Psych PSYCHIATRIC DISORDERS Anxiety Depression    GI/Hepatic PUD, GERD  Medicated,(+) Cirrhosis       ,   Endo/Other  diabetesMorbid obesity  Renal/GU CRFRenal disease     Musculoskeletal   Abdominal   Peds  Hematology  (+) anemia , hgb 7.1   Anesthesia Other Findings   Reproductive/Obstetrics                            Anesthesia Physical Anesthesia Plan  ASA: III  Anesthesia Plan: MAC   Post-op Pain Management:    Induction: Intravenous  PONV Risk Score and Plan: 1 and Treatment may vary due to age or medical condition and Propofol infusion  Airway Management Planned: Nasal Cannula  Additional Equipment: None  Intra-op Plan:   Post-operative Plan:   Informed Consent: I have reviewed the patients History and Physical, chart, labs and discussed the procedure including the risks, benefits and alternatives for the proposed anesthesia with the patient or authorized representative who has indicated his/her understanding and acceptance.     Dental advisory given  Plan Discussed with: CRNA and Surgeon  Anesthesia Plan Comments:         Anesthesia Quick Evaluation

## 2019-08-17 NOTE — Transfer of Care (Signed)
Immediate Anesthesia Transfer of Care Note  Patient: Jeffrey [name]  Procedure(s) Performed: ARTERIOVENOUS (AV) FISTULA CREATION VERSES ARTERIOVENOUS GRAFT (Left )  Patient Location: PACU  Anesthesia Type:MAC  Level of Consciousness: sedated and responds to stimulation  Airway & Oxygen Therapy: Patient Spontanous Breathing and Patient connected to face mask oxygen  Post-op Assessment: Report given to RN, Post -op Vital signs reviewed and stable and Patient moving all extremities  Post vital signs: Reviewed and stable  Last Vitals:  Vitals Value Taken Time  BP 138/66 08/17/19 0920  Temp    Pulse 87 08/17/19 0922  Resp 36 08/17/19 0922  SpO2 97 % 08/17/19 0922  Vitals shown include unvalidated device data.  Last Pain:  Vitals:   08/17/19 0655  PainSc: 0-No pain      Patients Stated Pain Goal: 3 (87/19/59 7471)  Complications: No apparent anesthesia complications

## 2019-08-17 NOTE — Interval H&P Note (Signed)
History and Physical Interval Note:  08/17/2019 6:57 AM  Jeffrey [name]  has presented today for surgery, with the diagnosis of END STAGE RENAL DISEASE.  The various methods of treatment have been discussed with the patient and family. After consideration of risks, benefits and other options for treatment, the patient has consented to  Procedure(s): ARTERIOVENOUS (AV) FISTULA CREATION VERSES ARTERIOVENOUS GRAFT (Left) as a surgical intervention.  The patient's history has been reviewed, patient examined, no change in status, stable for surgery.  I have reviewed the patient's chart and labs.  Questions were answered to the patient's satisfaction.     Curt Jews

## 2019-08-18 DIAGNOSIS — N186 End stage renal disease: Secondary | ICD-10-CM | POA: Diagnosis not present

## 2019-08-18 DIAGNOSIS — Z992 Dependence on renal dialysis: Secondary | ICD-10-CM | POA: Diagnosis not present

## 2019-08-18 DIAGNOSIS — Z23 Encounter for immunization: Secondary | ICD-10-CM | POA: Diagnosis not present

## 2019-08-21 DIAGNOSIS — N186 End stage renal disease: Secondary | ICD-10-CM | POA: Diagnosis not present

## 2019-08-21 DIAGNOSIS — Z992 Dependence on renal dialysis: Secondary | ICD-10-CM | POA: Diagnosis not present

## 2019-08-21 DIAGNOSIS — Z23 Encounter for immunization: Secondary | ICD-10-CM | POA: Diagnosis not present

## 2019-08-21 DIAGNOSIS — D509 Iron deficiency anemia, unspecified: Secondary | ICD-10-CM | POA: Diagnosis not present

## 2019-08-21 NOTE — Anesthesia Postprocedure Evaluation (Signed)
Anesthesia Post Note  Patient: Jeffrey [name]  Procedure(s) Performed: ARTERIOVENOUS (AV) FISTULA CREATION VERSES ARTERIOVENOUS GRAFT (Left )     Patient location during evaluation: PACU Anesthesia Type: MAC Level of consciousness: awake and alert Pain management: pain level controlled Vital Signs Assessment: post-procedure vital signs reviewed and stable Respiratory status: spontaneous breathing, nonlabored ventilation, respiratory function stable and patient connected to nasal cannula oxygen Cardiovascular status: stable and blood pressure returned to baseline Postop Assessment: no apparent nausea or vomiting Anesthetic complications: no    Last Vitals:  Vitals:   08/17/19 0935 08/17/19 0950  BP: (!) 143/67 139/77  Pulse: 87 81  Resp: 18 17  Temp:  36.7 C  SpO2: 98% 99%    Last Pain:  Vitals:   08/17/19 0950  PainSc: 0-No pain   Pain Goal: Patients Stated Pain Goal: 3 (08/17/19 0655)                 [name]

## 2019-08-22 ENCOUNTER — Ambulatory Visit: Payer: Medicare Other | Admitting: Nurse Practitioner

## 2019-08-23 DIAGNOSIS — Z23 Encounter for immunization: Secondary | ICD-10-CM | POA: Diagnosis not present

## 2019-08-23 DIAGNOSIS — N186 End stage renal disease: Secondary | ICD-10-CM | POA: Diagnosis not present

## 2019-08-23 DIAGNOSIS — Z992 Dependence on renal dialysis: Secondary | ICD-10-CM | POA: Diagnosis not present

## 2019-08-25 DIAGNOSIS — I10 Essential (primary) hypertension: Secondary | ICD-10-CM | POA: Diagnosis not present

## 2019-08-25 DIAGNOSIS — Z299 Encounter for prophylactic measures, unspecified: Secondary | ICD-10-CM | POA: Diagnosis not present

## 2019-08-25 DIAGNOSIS — L509 Urticaria, unspecified: Secondary | ICD-10-CM | POA: Diagnosis not present

## 2019-08-25 DIAGNOSIS — N186 End stage renal disease: Secondary | ICD-10-CM | POA: Diagnosis not present

## 2019-08-25 DIAGNOSIS — R339 Retention of urine, unspecified: Secondary | ICD-10-CM | POA: Diagnosis not present

## 2019-08-25 DIAGNOSIS — Z992 Dependence on renal dialysis: Secondary | ICD-10-CM | POA: Diagnosis not present

## 2019-08-25 DIAGNOSIS — Z23 Encounter for immunization: Secondary | ICD-10-CM | POA: Diagnosis not present

## 2019-08-25 DIAGNOSIS — N185 Chronic kidney disease, stage 5: Secondary | ICD-10-CM | POA: Diagnosis not present

## 2019-08-25 DIAGNOSIS — D631 Anemia in chronic kidney disease: Secondary | ICD-10-CM | POA: Diagnosis not present

## 2019-08-25 DIAGNOSIS — E1122 Type 2 diabetes mellitus with diabetic chronic kidney disease: Secondary | ICD-10-CM | POA: Diagnosis not present

## 2019-08-25 DIAGNOSIS — N182 Chronic kidney disease, stage 2 (mild): Secondary | ICD-10-CM | POA: Diagnosis not present

## 2019-08-28 DIAGNOSIS — E878 Other disorders of electrolyte and fluid balance, not elsewhere classified: Secondary | ICD-10-CM | POA: Diagnosis not present

## 2019-08-28 DIAGNOSIS — N186 End stage renal disease: Secondary | ICD-10-CM | POA: Diagnosis not present

## 2019-08-28 DIAGNOSIS — Z992 Dependence on renal dialysis: Secondary | ICD-10-CM | POA: Diagnosis not present

## 2019-08-28 DIAGNOSIS — Z23 Encounter for immunization: Secondary | ICD-10-CM | POA: Diagnosis not present

## 2019-08-30 DIAGNOSIS — Z992 Dependence on renal dialysis: Secondary | ICD-10-CM | POA: Diagnosis not present

## 2019-08-30 DIAGNOSIS — N186 End stage renal disease: Secondary | ICD-10-CM | POA: Diagnosis not present

## 2019-08-30 DIAGNOSIS — Z23 Encounter for immunization: Secondary | ICD-10-CM | POA: Diagnosis not present

## 2019-08-31 ENCOUNTER — Other Ambulatory Visit: Payer: Self-pay

## 2019-08-31 ENCOUNTER — Encounter: Payer: Self-pay | Admitting: Nurse Practitioner

## 2019-08-31 ENCOUNTER — Ambulatory Visit: Payer: Medicare Other | Admitting: Nurse Practitioner

## 2019-08-31 VITALS — BP 129/65 | HR 89 | Temp 97.1°F | Ht 67.0 in | Wt 252.0 lb

## 2019-08-31 DIAGNOSIS — K7031 Alcoholic cirrhosis of liver with ascites: Secondary | ICD-10-CM | POA: Diagnosis not present

## 2019-08-31 DIAGNOSIS — A0472 Enterocolitis due to Clostridium difficile, not specified as recurrent: Secondary | ICD-10-CM | POA: Insufficient documentation

## 2019-08-31 DIAGNOSIS — A498 Other bacterial infections of unspecified site: Secondary | ICD-10-CM | POA: Insufficient documentation

## 2019-08-31 NOTE — Progress Notes (Signed)
Cc'ed to pcp °

## 2019-08-31 NOTE — Progress Notes (Signed)
Referring Provider: Monico Blitz, MD Primary Care Physician:  Monico Blitz, MD Primary GI:  Dr. Gala Romney  Chief Complaint  Patient presents with  . Cirrhosis    f/u.  [name] Diarrhea    much improved    HPI:   Jeffrey [name] is a 74 y.o. male who presents for follow-up.  The patient was last seen in our office 83/38/2505 for alcoholic cirrhosis, diarrhea, esophageal varices, localized edema.  Noted history of cirrhosis and hepatic encephalopathy.  His cirrhosis is complicated with decompensation as evidenced by recurrent encephalopathy, chronic kidney disease followed by nephrology with a recent meld score of 17.  His diuretics were temporarily held at one point due to a bump in BUN/creatinine.  He is noncompliant with lactulose.  Family notes sleepier from time to time and a little "slow".  On 100 mg of propranolol daily with challenge getting heart rate to goal.  Known esophageal varices on primary prophylaxis.  Good support system.  Recent ER visit for hyperkalemia and GI bleed with worsening lower extremity edema and abdominal distention with creatinine 1.56 at that time.  No obvious anasarca and noted decline in hemoglobin 2 g in the previous 8 weeks with fecal occult blood test positive.  Hemoglobin was 8.1.  He was given IV Lasix.  Hemodynamically stable and discharged to follow-up with primary care and GI.  Meld score at that time 17, child Pugh class B.  At his last visit he noted his swelling improved on current diuretics started having diarrhea 1 to 2 weeks prior.  No recent hospital admission or antibiotics, was in the ER recently.  Stopped taking lactulose due to loose stools but still on Xifaxan twice daily.  Feels like he is having abdominal swelling and left-sided as well as lower abdominal discomfort.  Noted history of degenerative disc disease with intermittent worsening back pain.  No other overt GI complaints.  No overt confusion currently.  Appetite not great.  No other hepatic  complaints.  His weight was noted to be up 1-1/2 to 2 pounds compared to the previous month, currently on propranolol 40 mg with heart rate of 87 (not at goal).  On Aldactone 100 mg daily and Demadex 80 mg in the morning/60 mg in the evening.  Right upper quadrant imaging up-to-date 01/23/2019.  Recommended increasing propranolol to 60 mg in the morning and 80 mg in the evening, check heart rate and blood pressure every day and write them down and call us in 1 week with the results.  Continue medications.  Compression stockings and lower extremity elevation for edema.  Update labs.  2 g sodium diet.  Stool sample for etiology of diarrhea.  Follow-up in 2 months.  The patient is testing positive for C. difficile and was started on antibiotics by primary care.  However, he was only started on Flagyl 500 mg 3 times a day.  Apparently the patient presented to [facility] with decompensation, encephalopathy, persistent C. difficile diarrhea and was transferred to [facility].  He was admitted 04/20/2019 through 04/28/2019.  Brief review of care everywhere appears admission for severe sepsis with AKI, peritonitis secondary to C. difficile colitis and toxic metabolic encephalopathy.  Diagnostic paracentesis on 1119 with PMNs greater than 3000 and and cultures with no growth with SAAG consistent with portal hypertension.  Started on Cipro for prophylaxis but stopped on admission given ongoing C. difficile colitis.  He was started on Rocephin for 5 days.  Repeat  paracentesis 1125 with 3.5 L removed and PMNs 44.  Continue Bactrim DS daily for SBP prophylaxis.  Avoid nonspecific beta-blocker due to history of SBP.  The patient was taken off spironolactone and asked not to restart until she talks to our office.  Currently on vancomycin 125 mg 4 times a day as well as Bactrim 800/160 mg daily.  The patient spouse called our office with persistent diarrhea.  Recommended  complete antibiotics and then start a probiotic.  She called back to our office noting lower extremity and abdominal edema.  At that time on torsemide.  Recommended restart Aldactone 50 mg daily, continue torsemide, compounded cream for rectal redness due to frequent diarrhea, daily probiotic, finished antibiotics.  BMP in 1 week.  Phone call 05/09/2019 after PCP virtual office visit and changed to torsemide 3 tabs in the morning and 2 at lunch.  PCP able to draw labs if needed.  He was retested for C. difficile toxin due to persistent diarrhea after completing vancomycin.  Prescription for Dificid was sent to the pharmacy.  This was changed to prolonged vancomycin taper.  Noted rectal bleeding that is mild with persistent stools, unsure if urinary.  Recommended discussion with PCP for labs.  ER precautions given.  Recommended CBC, BMP.  Start probiotic.  ER precautions.  Follow-up in 24 to 48 hours.  At follow-up noted persistent bleeding with clots.  Advised to call EMS and go to [facility].  The patient went to [facility].  Today he states he's doing ok overall. Diarrhea much improved, remains on Vancomycin prolonged taper. Had a colonoscopy at [facility] ("just went partway cause my colon was so raw they were worried they would punch a hold in it."). Still with some swelling in his abdomen and bilateral LE. Denies abdominal pain, N/V. Denies hematochezia, melena, fever, chills, unintentional weight loss. He goes to dialysis on MWF. Denies any ETOH recently. Has had both COVID-19 vaccines. Was tested for COVID-19 and was negative.   He is on epopgen for CKD anemia. Is on iron. He does have some mild dyspnea when laying flat. Denies yellowing of skin/eyes, recent confusion. Does have itching (is on medication for this).   Past Medical History:  Diagnosis Date  . A-fib [facility])    when initially stating dialysis january 2021 at [facility] per spouse; never heard anything else about it  . Alcoholic  cirrhosis ([place])    patient reports completing hep a and b vaccines in 2006  . Anemia    has had 3 units of prbcs 2013  . Anxiety   . Asymptomatic gallstones    Ultrasound in 2006  . Cholelithiasis   . Chronic kidney disease   . Depression   . Diabetes ([place])   . Duodenal ulcer with hemorrhage    per patient in 2006 or 2007, records have been requested  . 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 HOH (hard of hearing)   . Hypertension   . Splenomegaly    Ultrasound in 2006  . Tubular adenoma of colon     Past Surgical History:  Procedure Laterality Date  . APPENDECTOMY    . AV FISTULA PLACEMENT Left 08/17/2019   Procedure: ARTERIOVENOUS (AV) FISTULA CREATION VERSES ARTERIOVENOUS GRAFT;  Surgeon: [name], MD;  Location: [place];  Service: Vascular;  Laterality: Left;  . BIOPSY  02/03/2018   Procedure: BIOPSY;  Surgeon: Daneil Dolin,  MD;  [facility];  Service: Endoscopy;;  bx of gastric polyps  . CATARACT EXTRACTION Right   . COLONOSCOPY  03/10/2005   Rectal polyp as described above, removed with snare. Left sided  diverticula. The remainder of the colonic mucosa appeared normal. Inflammed polyp on path.  . COLONOSCOPY  04/1999   Dr. Thea Silversmith polyps removed,  Path showed hyperplastic  . COLONOSCOPY  10/2015   Dr. Britta Mccreedy: diverticulosis, single sessile tubular adenoma 3-20mm in size removed from descending colon.   . COLONOSCOPY WITH ESOPHAGOGASTRODUODENOSCOPY (EGD)  03/31/2012   OVZ:CHYIFOY AVMs. Colonic diverticulosis. tubular adenoma colon  . ESOPHAGEAL BANDING N/A 02/03/2018   Procedure: ESOPHAGEAL BANDING;  Surgeon: Daneil Dolin, MD;  [facility];  Service: Endoscopy;  Laterality: N/A;  . ESOPHAGOGASTRODUODENOSCOPY  01/15/2005   Three columns grade 1 to 2 esophageal varices, otherwise normal esophageal mucosa.  Esophagus was not manipulated otherwise./Nodularity of the antrum with overlying erosions,  nonspecific finding. Path showed rare H.pylori  . ESOPHAGOGASTRODUODENOSCOPY  09/2008   Dr. Gaylene Brooks columns of grade 2-3 esoph varices, only one column was prominent. Portal gastropathy, multiple gastrc polyps at antrum, two were 2cm with black eschar, bulbar polyps, bulbar erosions  . ESOPHAGOGASTRODUODENOSCOPY  11/2004   Dr. Brantley Stage 3 esoph varices  . ESOPHAGOGASTRODUODENOSCOPY  03/31/2012   RMR: 4 columns(3-GR 2, 1-GR1) non-bleeding esophageal varices, portal gastropathy, small HH, early GAVE, multiple gastric polyps   . ESOPHAGOGASTRODUODENOSCOPY (EGD) WITH PROPOFOL N/A 02/03/2018   Dr. Gala Romney: Esophageal varices, 3 columns grade 1-2.  Portal hypertensive gastropathy.  Multiple gastric polyps, biopsy consistent with hyperplastic.  [name] GASTRIC VARICES BANDING  03/31/2012   Procedure: GASTRIC VARICES BANDING;  Surgeon: Daneil Dolin, MD;  [facility];  Service: Endoscopy;  Laterality: N/A;  . UMBILICAL HERNIA REPAIR  2017   [facility]    Current Outpatient Medications  Medication Sig Dispense Refill  . Ascorbic Acid (VITAMIN C) 1000 MG tablet Take 1,000 mg by mouth 2 (two) times daily.    . cholestyramine (QUESTRAN) 4 GM/DOSE powder Take 4 g by mouth 2 (two) times daily with a meal.     . Continuous Blood Gluc Sensor (FREESTYLE LIBRE 14 DAY SENSOR) MISC Inject 1 each into the skin every 14 (fourteen) days. Use as directed. 2 each 2  . furosemide (LASIX) 80 MG tablet Take 80 mg by mouth daily.    [name] gabapentin (NEURONTIN) 100 MG capsule Take 100 mg by mouth at bedtime.     [name] HYDROcodone-acetaminophen (NORCO) 10-325 MG tablet Take 1 tablet by mouth every 6 (six) hours as needed for moderate pain.     [name] insulin degludec (TRESIBA FLEXTOUCH) 200 UNIT/ML FlexTouch Pen Inject 120 mLs into the skin at bedtime.    . insulin lispro (HUMALOG) 100 UNIT/ML KiwkPen Inject 50 Units into the skin 3 (three) times daily.     . iron polysaccharides (NIFEREX) 150 MG capsule Take 150 mg by  mouth at bedtime.     . metoprolol tartrate (LOPRESSOR) 25 MG tablet Take 25 mg by mouth 2 (two) times daily.    . milk thistle 175 MG tablet Take 175 mg by mouth 3 (three) times daily.     . mirtazapine (REMERON) 15 MG tablet Take 15 mg by mouth at bedtime.    . Multiple Vitamin (MULTIVITAMIN WITH MINERALS) TABS tablet Take 1 tablet by mouth daily.    [name] omega-3 acid ethyl esters (LOVAZA) 1 g capsule Take 2 g by mouth 2 (two)  times daily.     [name] omeprazole (PRILOSEC) 40 MG capsule Take 40 mg by mouth daily.    . pravastatin (PRAVACHOL) 20 MG tablet Take 20 mg by mouth daily.    . Probiotic Product (PROBIOTIC DAILY PO) Take 1 tablet by mouth daily.     . rifaximin (XIFAXAN) 550 MG TABS tablet Take 1 tablet (550 mg total) by mouth 2 (two) times daily. 60 tablet 11  . sitaGLIPtin (JANUVIA) 50 MG tablet Take 50 mg by mouth daily.     [name] spironolactone (ALDACTONE) 50 MG tablet Take 50 mg by mouth daily.     [name] thiamine 100 MG tablet Take 100 mg by mouth daily. Vitamin B-1    . tiZANidine (ZANAFLEX) 2 MG tablet Take 2 mg by mouth every 6 (six) hours as needed for muscle spasms.     [name] triamcinolone cream (KENALOG) 0.1 % Apply 1 application topically 2 (two) times daily as needed (dry skin Itching).   3  . vancomycin (VANCOCIN) 125 MG capsule Take 1 capsule (125 mg total) by mouth 4 (four) times daily. 60 capsule 2  . venlafaxine (EFFEXOR) 75 MG tablet Take 75 mg by mouth 3 (three) times daily with meals.     . hydrocortisone (ANUSOL-HC) 2.5 % rectal cream Place 1 application rectally 2 (two) times daily. (Patient not taking: Reported on 08/14/2019) 30 g 1  . oxyCODONE-acetaminophen (PERCOCET/ROXICET) 5-325 MG tablet Take 1 tablet by mouth every 6 (six) hours as needed. (Patient not taking: Reported on 08/31/2019) 10 tablet 0  . pantoprazole (PROTONIX) 20 MG tablet Take 1 tablet (20 mg total) by mouth daily. (Patient not taking: Reported on 08/14/2019) 30 tablet 0  . PRESCRIPTION MEDICATION Take 5 mLs by mouth  in the morning and at bedtime. Vancomycin 125mg /47ml Susp  Take 5 ml by mouth four times daily for 2 week Tale 5 ml by mouth three times a day  2 weeks Tale 5 ml by mouth two times a day until gone    . propranolol (INDERAL) 40 MG tablet TAKE 40MG  AT BREAKFAST, 40MG  AT LUNCH, 20 MG AT BEDTIME (Patient not taking: Reported on 05/02/2019) 75 tablet 5  . tamsulosin (FLOMAX) 0.4 MG CAPS capsule Take 0.4 mg by mouth at bedtime.      No current facility-administered medications for this visit.    Allergies as of 08/31/2019  . (No Known Allergies)    Family History  Problem Relation Age of Onset  . Colon cancer Father 50       deceased age 54  . Breast cancer Sister        deceased  . Breast cancer Sister   . Stroke Mother   . Lung cancer Neg Hx   . Ovarian cancer Neg Hx     Social History   Socioeconomic History  . Marital status: Married    Spouse name: Not on file  . Number of children: 2  . Years of education: Not on file  . Highest education level: Not on file  Occupational History  . Occupation: Disabled    Employer: SELF EMPLOYED  Tobacco Use  . Smoking status: Former Smoker    Packs/day: 0.50    Years: 25.00    Pack years: 12.50    Types: Cigarettes    Quit date: 01/28/1986    Years since quitting: 33.6  . Smokeless tobacco: Never Used  Substance and Sexual Activity  . Alcohol use: No    Comment: quit in 05/2013  .  Drug use: No  . Sexual activity: Not Currently  Other Topics Concern  . Not on file  Social History Narrative   Two step children from second marriage (divorced) who live with him along with some grandchildren.    Social Determinants of Health   Financial Resource Strain:   . Difficulty of Paying Living Expenses:   Food Insecurity:   . Worried About Charity fundraiser in the Last Year:   . Arboriculturist in the Last Year:   Transportation Needs:   . Film/video editor (Medical):   [name] Lack of Transportation (Non-Medical):   Physical  Activity:   . Days of Exercise per Week:   . Minutes of Exercise per Session:   Stress:   . Feeling of Stress :   Social Connections:   . Frequency of Communication with Friends and Family:   . Frequency of Social Gatherings with Friends and Family:   . Attends Religious Services:   . Active Member of Clubs or Organizations:   . Attends Archivist Meetings:   [name] Marital Status:     Subjective: Review of Systems  Constitutional: Negative for chills, fever, malaise/fatigue and weight loss.  HENT: Negative for congestion and sore throat.   Respiratory: Positive for shortness of breath (with laying flat). Negative for cough.   Cardiovascular: Positive for leg swelling. Negative for chest pain and palpitations.  Gastrointestinal: Negative for abdominal pain, blood in stool, diarrhea, melena, nausea and vomiting.  Skin: Positive for itching. Negative for rash.  Neurological: Negative for dizziness and weakness.  Endo/Heme/Allergies: Does not bruise/bleed easily.  Psychiatric/Behavioral: Negative for depression. The patient is not nervous/anxious.   All other systems reviewed and are negative.    Objective: BP 129/65   Pulse 89   Temp (!) 97.1 F (36.2 C) (Oral)   Ht 5\' 7"  (1.702 m)   Wt 252 lb (114.3 kg)   BMI 39.47 kg/m  Physical Exam Vitals and nursing note reviewed.  Constitutional:      General: He is not in acute distress.    Appearance: Normal appearance. He is obese. He is not ill-appearing, toxic-appearing or diaphoretic.  HENT:     Head: Normocephalic and atraumatic.     Nose: No congestion or rhinorrhea.  Eyes:     General: No scleral icterus. Cardiovascular:     Rate and Rhythm: Normal rate and regular rhythm.     Heart sounds: Normal heart sounds.  Pulmonary:     Effort: Pulmonary effort is normal. No respiratory distress.     Breath sounds: Normal breath sounds.  Abdominal:     General: Abdomen is protuberant. Bowel sounds are normal. There is  distension (mild).     Palpations: Abdomen is soft. There is no hepatomegaly, splenomegaly or mass.     Tenderness: There is no abdominal tenderness. There is no guarding or rebound.     Hernia: No hernia is present.  Musculoskeletal:     Cervical back: Neck supple.     Right lower leg: Edema present.     Left lower leg: Edema present.     Comments: Uses walker/sitting walker to help with ambulation  Skin:    General: Skin is warm and dry.     Coloration: Skin is not jaundiced.     Findings: No bruising or rash.  Neurological:     General: No focal deficit present.     Mental Status: He is alert and oriented to person, place, and  time. Mental status is at baseline.  Psychiatric:        Mood and Affect: Mood normal.        Behavior: Behavior normal.        Thought Content: Thought content normal.       08/31/2019 10:52 AM   Disclaimer: This note was dictated with voice recognition software. Similar sounding words can inadvertently be transcribed and may not be corrected upon review.

## 2019-08-31 NOTE — Patient Instructions (Signed)
Your health issues we discussed today were:   Cirrhosis: 1. Have your labs drawn when you are able to 2. We will help schedule ultrasound of your liver 3. Continue your current medications including your fluid pills and Xifaxan 4. We will set up an ultrasound paracentesis with labs 5. Call us if you have any worsening or severe symptoms  C. difficile diarrhea: 1. I am glad you are diarrhea is doing better 2. Continue taking your vancomycin until you have taken all of your medication 3. Call if you have any worsening or severe symptoms  Overall I recommend:  1. Continue your other current medications 2. Return for follow-up in 2 months 3. Call us if you have any questions or concerns   At [facility] we value your feedback. You may receive a survey about your visit today. Please share your experience as we strive to create trusting relationships with our patients to provide genuine, compassionate, quality care.  We appreciate your understanding and patience as we review any laboratory studies, imaging, and other diagnostic tests that are ordered as we care for you. Our office policy is 5 business days for review of these results, and any emergent or urgent results are addressed in a timely manner for your best interest. If you do not hear from our office in 1 week, please contact us.   We also encourage the use of MyChart, which contains your medical information for your review as well. If you are not enrolled in this feature, an access code is on this after visit summary for your convenience. Thank you for allowing [place] to be involved in your care.  It was great to see you today!  I hope you have a great day!!

## 2019-09-01 DIAGNOSIS — Z992 Dependence on renal dialysis: Secondary | ICD-10-CM | POA: Diagnosis not present

## 2019-09-01 DIAGNOSIS — N186 End stage renal disease: Secondary | ICD-10-CM | POA: Diagnosis not present

## 2019-09-04 DIAGNOSIS — N186 End stage renal disease: Secondary | ICD-10-CM | POA: Diagnosis not present

## 2019-09-04 DIAGNOSIS — Z992 Dependence on renal dialysis: Secondary | ICD-10-CM | POA: Diagnosis not present

## 2019-09-04 DIAGNOSIS — E878 Other disorders of electrolyte and fluid balance, not elsewhere classified: Secondary | ICD-10-CM | POA: Diagnosis not present

## 2019-09-06 ENCOUNTER — Ambulatory Visit (HOSPITAL_COMMUNITY): Admission: RE | Admit: 2019-09-06 | Payer: Medicare Other | Source: Ambulatory Visit

## 2019-09-06 ENCOUNTER — Ambulatory Visit (HOSPITAL_COMMUNITY): Payer: Medicare Other

## 2019-09-06 DIAGNOSIS — Z992 Dependence on renal dialysis: Secondary | ICD-10-CM | POA: Diagnosis not present

## 2019-09-06 DIAGNOSIS — N186 End stage renal disease: Secondary | ICD-10-CM | POA: Diagnosis not present

## 2019-09-07 ENCOUNTER — Ambulatory Visit (HOSPITAL_COMMUNITY)
Admission: RE | Admit: 2019-09-07 | Discharge: 2019-09-07 | Disposition: A | Discharge status: Home / Self Care | Payer: Medicare Other | Source: Ambulatory Visit | Attending: Nurse Practitioner | Admitting: Nurse Practitioner

## 2019-09-07 ENCOUNTER — Other Ambulatory Visit: Payer: Self-pay

## 2019-09-07 DIAGNOSIS — K7031 Alcoholic cirrhosis of liver with ascites: Secondary | ICD-10-CM

## 2019-09-07 DIAGNOSIS — A0472 Enterocolitis due to Clostridium difficile, not specified as recurrent: Secondary | ICD-10-CM | POA: Diagnosis not present

## 2019-09-07 DIAGNOSIS — E1165 Type 2 diabetes mellitus with hyperglycemia: Secondary | ICD-10-CM | POA: Diagnosis not present

## 2019-09-07 DIAGNOSIS — E1122 Type 2 diabetes mellitus with diabetic chronic kidney disease: Secondary | ICD-10-CM | POA: Diagnosis not present

## 2019-09-07 DIAGNOSIS — Z299 Encounter for prophylactic measures, unspecified: Secondary | ICD-10-CM | POA: Diagnosis not present

## 2019-09-07 DIAGNOSIS — Z789 Other specified health status: Secondary | ICD-10-CM | POA: Diagnosis not present

## 2019-09-07 DIAGNOSIS — I10 Essential (primary) hypertension: Secondary | ICD-10-CM | POA: Diagnosis not present

## 2019-09-07 DIAGNOSIS — K8020 Calculus of gallbladder without cholecystitis without obstruction: Secondary | ICD-10-CM | POA: Diagnosis not present

## 2019-09-08 ENCOUNTER — Emergency Department (HOSPITAL_COMMUNITY)
Admission: EM | Admit: 2019-09-08 | Discharge: 2019-09-08 | Disposition: A | Discharge status: Home / Self Care | Payer: Medicare Other | Attending: Emergency Medicine | Admitting: Emergency Medicine

## 2019-09-08 ENCOUNTER — Emergency Department (HOSPITAL_COMMUNITY): Payer: Medicare Other

## 2019-09-08 ENCOUNTER — Other Ambulatory Visit: Payer: Self-pay

## 2019-09-08 ENCOUNTER — Encounter (HOSPITAL_COMMUNITY): Payer: Self-pay | Admitting: *Deleted

## 2019-09-08 DIAGNOSIS — S0990XA Unspecified injury of head, initial encounter: Secondary | ICD-10-CM

## 2019-09-08 DIAGNOSIS — S199XXA Unspecified injury of neck, initial encounter: Secondary | ICD-10-CM | POA: Diagnosis not present

## 2019-09-08 DIAGNOSIS — W010XXA Fall on same level from slipping, tripping and stumbling without subsequent striking against object, initial encounter: Secondary | ICD-10-CM | POA: Diagnosis not present

## 2019-09-08 DIAGNOSIS — Y9389 Activity, other specified: Secondary | ICD-10-CM | POA: Insufficient documentation

## 2019-09-08 DIAGNOSIS — S0083XA Contusion of other part of head, initial encounter: Secondary | ICD-10-CM | POA: Insufficient documentation

## 2019-09-08 DIAGNOSIS — Y999 Unspecified external cause status: Secondary | ICD-10-CM | POA: Diagnosis not present

## 2019-09-08 DIAGNOSIS — M542 Cervicalgia: Secondary | ICD-10-CM | POA: Diagnosis not present

## 2019-09-08 DIAGNOSIS — S8991XA Unspecified injury of right lower leg, initial encounter: Secondary | ICD-10-CM | POA: Insufficient documentation

## 2019-09-08 DIAGNOSIS — M25561 Pain in right knee: Secondary | ICD-10-CM | POA: Insufficient documentation

## 2019-09-08 DIAGNOSIS — W19XXXA Unspecified fall, initial encounter: Secondary | ICD-10-CM

## 2019-09-08 DIAGNOSIS — R519 Headache, unspecified: Secondary | ICD-10-CM | POA: Diagnosis not present

## 2019-09-08 DIAGNOSIS — Y929 Unspecified place or not applicable: Secondary | ICD-10-CM | POA: Insufficient documentation

## 2019-09-08 LAB — CBC WITH DIFFERENTIAL/PLATELET
Absolute Monocytes: 318 cells/uL (ref 200–950)
Basophils Absolute: 30 cells/uL (ref 0–200)
Basophils Relative: 0.7 %
Eosinophils Absolute: 611 cells/uL — ABNORMAL HIGH (ref 15–500)
Eosinophils Relative: 14.2 %
HCT: 24.2 % — ABNORMAL LOW (ref 38.5–50.0)
Hemoglobin: 7.6 g/dL — ABNORMAL LOW (ref 13.2–17.1)
Lymphs Abs: 439 cells/uL — ABNORMAL LOW (ref 850–3900)
MCH: 29.5 pg (ref 27.0–33.0)
MCHC: 31.4 g/dL — ABNORMAL LOW (ref 32.0–36.0)
MCV: 93.8 fL (ref 80.0–100.0)
MPV: 9.4 fL (ref 7.5–12.5)
Monocytes Relative: 7.4 %
Neutro Abs: 2903 cells/uL (ref 1500–7800)
Neutrophils Relative %: 67.5 %
Platelets: 153 10*3/uL (ref 140–400)
RBC: 2.58 10*6/uL — ABNORMAL LOW (ref 4.20–5.80)
RDW: 16.1 % — ABNORMAL HIGH (ref 11.0–15.0)
Total Lymphocyte: 10.2 %
WBC: 4.3 10*3/uL (ref 3.8–10.8)

## 2019-09-08 LAB — AFP TUMOR MARKER: AFP-Tumor Marker: 1.5 ng/mL (ref <6.1)

## 2019-09-08 LAB — COMPREHENSIVE METABOLIC PANEL
AG Ratio: 0.9 (calc) — ABNORMAL LOW (ref 1.0–2.5)
ALT: 13 U/L (ref 9–46)
AST: 23 U/L (ref 10–35)
Albumin: 3.3 g/dL — ABNORMAL LOW (ref 3.6–5.1)
Alkaline phosphatase (APISO): 122 U/L (ref 35–144)
BUN/Creatinine Ratio: 6 (calc) (ref 6–22)
BUN: 14 mg/dL (ref 7–25)
CO2: 28 mmol/L (ref 20–32)
Calcium: 8.9 mg/dL (ref 8.6–10.3)
Chloride: 94 mmol/L — ABNORMAL LOW (ref 98–110)
Creat: 2.4 mg/dL — ABNORMAL HIGH (ref 0.70–1.18)
Globulin: 3.7 g/dL (calc) (ref 1.9–3.7)
Glucose, Bld: 102 mg/dL — ABNORMAL HIGH (ref 65–99)
Potassium: 3.3 mmol/L — ABNORMAL LOW (ref 3.5–5.3)
Sodium: 132 mmol/L — ABNORMAL LOW (ref 135–146)
Total Bilirubin: 0.6 mg/dL (ref 0.2–1.2)
Total Protein: 7 g/dL (ref 6.1–8.1)

## 2019-09-08 LAB — PROTIME-INR
INR: 1.1
Prothrombin Time: 11.3 s (ref 9.0–11.5)

## 2019-09-08 NOTE — ED Triage Notes (Signed)
Fell last night, bruise to forehead, denies any LOC

## 2019-09-08 NOTE — Discharge Instructions (Signed)
Your imaging today is reassuring, no signs of injury to your brain from the fall, or to your neck.  If you develop severe headache, vomiting, vision changes or other new or concerning symptoms come back to the ED.  Please call your dialysis center first thing tomorrow morning so that they can hopefully work you in for dialysis since you are unable to have this today.

## 2019-09-08 NOTE — ED Provider Notes (Signed)
[name] EMERGENCY DEPARTMENT Provider Note   CSN: [id number] Arrival date & time: 09/08/19  1531     History Chief Complaint  Patient presents with  . Fall  . Head Injury    Jeffrey [name] is a 74 y.o. male.  Jeffrey [name] is a 74 y.o. male with a history of A. fib, cirrhosis, ESRD on HD, hypertension, who presents to the emergency department for evaluation after fall with.  Patient states last night when he was getting up from the dinner table to wash his hands he got tripped up on the corner of the rug and fell forward striking his forehead on the ground.  No LOC.  Reports slight headache with soreness over his forehead.  No vision changes, nausea, vomiting, dizziness, numbness weakness or tingling.  He also reports some mild pain in the back of his neck.  He reports immediately after the fall he had some right-sided shoulder pain which he reports has since resolved.  He also reports a mild pain over the front of his knee.  He has 2 small superficial abrasions to the backs of fingers on the right hand.  He states that he went to go to dialysis today but they told him that he needed to come in for evaluation after his fall before he could get dialysis completed.  Took some Tylenol for soreness which improved.  No other aggravating or alleviating factors.        Past Medical History:  Diagnosis Date  . A-fib [facility])    when initially stating dialysis january 2021 at [facility] per spouse; never heard anything else about it  . Alcoholic cirrhosis ([place])    patient reports completing hep a and b vaccines in 2006  . Anemia    has had 3 units of prbcs 2013  . Anxiety   . Asymptomatic gallstones    Ultrasound in 2006  . Cholelithiasis   . Chronic kidney disease   . Depression   . Diabetes ([place])   . Duodenal ulcer with hemorrhage    per patient in 2006 or 2007, records have been requested  . 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  HOH (hard of hearing)   . Hypertension   . Splenomegaly    Ultrasound in 2006  . Tubular adenoma of colon     Patient Active Problem List   Diagnosis Date Noted  . Enteritis due to Clostridium difficile 08/31/2019  . Edema 04/11/2019  . Alcoholic cirrhosis ([place]) 86/76/7209  . Type 2 diabetes mellitus with stage 3 chronic kidney disease, with long-term current use of insulin ([place]) 10/20/2017  . Essential hypertension, benign 10/20/2017  . Morbid obesity ([place]) 10/20/2017  . Mixed hyperlipidemia 10/20/2017  . Ascites   . SBP (spontaneous bacterial peritonitis) ([place]) 01/12/2016  . Shigella dysenteriae 01/11/2016  . Diarrhea 01/10/2016  . Cirrhosis ([place]) 09/05/2012  . Alcoholic cirrhosis of liver with ascites ([place]) 03/09/2012  . Heme positive stool 03/09/2012  . Anemia due to chronic blood loss 03/09/2012  . Pancytopenia ([place]) 03/09/2012  . FH: colon cancer 03/09/2012  . Esophageal varices in alcoholic cirrhosis ([place]) 47/01/6282    Past Surgical History:  Procedure Laterality Date  . APPENDECTOMY    . AV FISTULA PLACEMENT Left 08/17/2019   Procedure: ARTERIOVENOUS (AV) FISTULA CREATION VERSES ARTERIOVENOUS GRAFT;  Surgeon: [name], MD;  Location: [facility];  Service: Vascular;  Laterality: Left;  .  BIOPSY  02/03/2018   Procedure: BIOPSY;  Surgeon: Daneil Dolin, MD;  [facility];  Service: Endoscopy;;  bx of gastric polyps  . CATARACT EXTRACTION Right   . COLONOSCOPY  03/10/2005   Rectal polyp as described above, removed with snare. Left sided  diverticula. The remainder of the colonic mucosa appeared normal. Inflammed polyp on path.  . COLONOSCOPY  04/1999   Dr. Thea Silversmith polyps removed,  Path showed hyperplastic  . COLONOSCOPY  10/2015   Dr. Britta Mccreedy: diverticulosis, single sessile tubular adenoma 3-56mm in size removed from descending colon.   . COLONOSCOPY WITH ESOPHAGOGASTRODUODENOSCOPY (EGD)  03/31/2012   DJS:HFWYOVZ AVMs. Colonic diverticulosis. tubular  adenoma colon  . ESOPHAGEAL BANDING N/A 02/03/2018   Procedure: ESOPHAGEAL BANDING;  Surgeon: Daneil Dolin, MD;  [facility];  Service: Endoscopy;  Laterality: N/A;  . ESOPHAGOGASTRODUODENOSCOPY  01/15/2005   Three columns grade 1 to 2 esophageal varices, otherwise normal esophageal mucosa.  Esophagus was not manipulated otherwise./Nodularity of the antrum with overlying erosions, nonspecific finding. Path showed rare H.pylori  . ESOPHAGOGASTRODUODENOSCOPY  09/2008   Dr. Gaylene Brooks columns of grade 2-3 esoph varices, only one column was prominent. Portal gastropathy, multiple gastrc polyps at antrum, two were 2cm with black eschar, bulbar polyps, bulbar erosions  . ESOPHAGOGASTRODUODENOSCOPY  11/2004   Dr. Brantley Stage 3 esoph varices  . ESOPHAGOGASTRODUODENOSCOPY  03/31/2012   RMR: 4 columns(3-GR 2, 1-GR1) non-bleeding esophageal varices, portal gastropathy, small HH, early GAVE, multiple gastric polyps   . ESOPHAGOGASTRODUODENOSCOPY (EGD) WITH PROPOFOL N/A 02/03/2018   Dr. Gala Romney: Esophageal varices, 3 columns grade 1-2.  Portal hypertensive gastropathy.  Multiple gastric polyps, biopsy consistent with hyperplastic.  [name] GASTRIC VARICES BANDING  03/31/2012   Procedure: GASTRIC VARICES BANDING;  Surgeon: Daneil Dolin, MD;  [facility];  Service: Endoscopy;  Laterality: N/A;  . UMBILICAL HERNIA REPAIR  2017   [facility]       Family History  Problem Relation Age of Onset  . Colon cancer Father 41       deceased age 62  . Breast cancer Sister        deceased  . Breast cancer Sister   . Stroke Mother   . Lung cancer Neg Hx   . Ovarian cancer Neg Hx     Social History   Tobacco Use  . Smoking status: Former Smoker    Packs/day: 0.50    Years: 25.00    Pack years: 12.50    Types: Cigarettes    Quit date: 01/28/1986    Years since quitting: 33.6  . Smokeless tobacco: Never Used  Substance Use Topics  . Alcohol use: No    Comment: quit in 05/2013  .  Drug use: No    Home Medications Prior to Admission medications   Medication Sig Start Date End Date Taking? Authorizing Provider  Ascorbic Acid (VITAMIN C) 1000 MG tablet Take 1,000 mg by mouth 2 (two) times daily.    [provider]  cholestyramine Lucrezia Starch) 4 GM/DOSE powder Take 4 g by mouth 2 (two) times daily with a meal.  08/03/19   [provider]  Continuous Blood Gluc Sensor (FREESTYLE LIBRE 14 DAY SENSOR) MISC Inject 1 each into the skin every 14 (fourteen) days. Use as directed. 12/15/17   Cassandria Anger, MD  furosemide (LASIX) 80 MG tablet Take 80 mg by mouth daily. 08/07/19   [provider]  gabapentin (NEURONTIN) 100 MG capsule Take 100 mg by mouth at  bedtime.  01/07/19   [provider]  HYDROcodone-acetaminophen (NORCO) 10-325 MG tablet Take 1 tablet by mouth every 6 (six) hours as needed for moderate pain.  04/05/19   [provider]  insulin degludec (TRESIBA FLEXTOUCH) 200 UNIT/ML FlexTouch Pen Inject 120 mLs into the skin at bedtime. 04/07/19   [provider]  insulin lispro (HUMALOG) 100 UNIT/ML KiwkPen Inject 50 Units into the skin 3 (three) times daily.     [provider]  iron polysaccharides (NIFEREX) 150 MG capsule Take 150 mg by mouth at bedtime.     [provider]  metoprolol tartrate (LOPRESSOR) 25 MG tablet Take 25 mg by mouth 2 (two) times daily. 08/07/19   [provider]  milk thistle 175 MG tablet Take 175 mg by mouth 3 (three) times daily.     [provider]  mirtazapine (REMERON) 15 MG tablet Take 15 mg by mouth at bedtime.    [provider]  Multiple Vitamin (MULTIVITAMIN WITH MINERALS) TABS tablet Take 1 tablet by mouth daily.    [provider]  omega-3 acid ethyl esters (LOVAZA) 1 g capsule Take 2 g by mouth 2 (two) times daily.  04/07/19   [provider]  omeprazole (PRILOSEC) 40 MG capsule Take 40 mg by mouth daily.    [provider]  pravastatin (PRAVACHOL) 20 MG tablet Take 20 mg by mouth daily.    [provider]  Probiotic Product (PROBIOTIC DAILY PO) Take 1 tablet by mouth daily.     [provider]  propranolol (INDERAL) 40 MG tablet TAKE 40MG  AT BREAKFAST, 40MG  AT LUNCH, 20 MG AT BEDTIME Patient not taking: Reported on 05/02/2019 01/13/19   Mahala Menghini, PA-C  rifaximin (XIFAXAN) 550 MG TABS tablet Take 1 tablet (550 mg total) by mouth 2 (two) times daily. 01/13/19   Mahala Menghini, PA-C  sitaGLIPtin (JANUVIA) 50 MG tablet Take 50 mg by mouth daily.     [provider]  spironolactone (ALDACTONE) 50 MG tablet Take 50 mg by mouth daily.     [provider]  thiamine 100 MG tablet Take 100 mg by mouth daily. Vitamin B-1    [provider]  tiZANidine (ZANAFLEX) 2 MG tablet Take 2 mg by mouth every 6 (six) hours as needed for muscle spasms.     [provider]  triamcinolone cream (KENALOG) 0.1 % Apply 1 application topically 2 (two) times daily as needed (dry skin Itching).  12/30/17   [provider]  vancomycin (VANCOCIN) 125 MG capsule Take 1 capsule (125 mg total) by mouth 4 (four) times daily. 05/20/19   Rogene Houston, MD  venlafaxine (EFFEXOR) 75 MG tablet Take 75 mg by mouth 3 (three) times daily with meals.     [provider]    Allergies    Patient has no known allergies.  Review of Systems   Review of Systems  Constitutional: Negative for chills and fever.  HENT: Negative.   Eyes: Negative for visual disturbance.  Respiratory: Negative for shortness of breath.   Cardiovascular: Negative for chest pain.  Gastrointestinal: Negative for abdominal pain, nausea and vomiting.  Musculoskeletal: Positive for arthralgias and neck pain. Negative for back pain, joint swelling and myalgias.  Skin: Negative for color change and wound.  Neurological: Positive for headaches.    Physical Exam Updated Vital Signs BP 135/61    Pulse 86   Temp 98.4 F (36.9 C)   Resp 18  Ht 5\' 7"  (1.702 m)   Wt 113.4 kg   SpO2 94%   BMI 39.16 kg/m   Physical Exam Vitals and nursing note reviewed.  Constitutional:      General: He is not in acute distress.    Appearance: Normal appearance. He is well-developed and normal weight. He is not ill-appearing or diaphoretic.  HENT:     Head: Normocephalic and atraumatic.  Eyes:     General:        Right eye: No discharge.        Left eye: No discharge.  Neck:     Comments: Mild midline C-spine tenderness without palpable deformity or step-off Cardiovascular:     Rate and Rhythm: Normal rate and regular rhythm.     Pulses: Normal pulses.     Heart sounds: Normal heart sounds. No murmur. No friction rub. No gallop.   Pulmonary:     Effort: Pulmonary effort is normal. No respiratory distress.     Breath sounds: Normal breath sounds. No wheezing or rales.     Comments: Respirations equal and unlabored, patient able to speak in full sentences, lungs clear to auscultation bilaterally, chest wall NTTP Chest:     Chest wall: No tenderness.  Abdominal:     General: Bowel sounds are normal. There is no distension.     Palpations: Abdomen is soft. There is no mass.     Tenderness: There is no abdominal tenderness. There is no guarding.     Comments: Abdomen soft, nondistended, nontender to palpation in all quadrants without guarding or peritoneal signs  Musculoskeletal:        General: No deformity.     Cervical back: Neck supple. Tenderness present.     Comments: No midline thoracic or lumbar spine tenderness. Mild tenderness over the right knee without significant deformity or swelling. All other joints supple and easily movable, all compartments soft.  Skin:    General: Skin is warm and dry.     Capillary Refill: Capillary refill takes less than 2 seconds.  Neurological:     Mental Status: He is alert.     Coordination: Coordination normal.     Comments: Speech is clear,  able to follow commands CN III-XII intact Normal strength in upper and lower extremities bilaterally including dorsiflexion and plantar flexion, strong and equal grip strength Sensation normal to light and sharp touch Moves extremities without ataxia, coordination intact   Psychiatric:        Mood and Affect: Mood normal.        Behavior: Behavior normal.     ED Results / Procedures / Treatments   Labs (all labs ordered are listed, but only abnormal results are displayed) Labs Reviewed - No data to display  EKG None  Radiology CT Head Wo Contrast  Result Date: 09/08/2019 CLINICAL DATA:  Fall, head injury, bruise to forehead, neck pain EXAM: CT HEAD WITHOUT CONTRAST CT CERVICAL SPINE WITHOUT CONTRAST TECHNIQUE: Multidetector CT imaging of the head and cervical spine was performed following the standard protocol without intravenous contrast. Multiplanar CT image reconstructions of the cervical spine were also generated. COMPARISON:  None. FINDINGS: CT HEAD FINDINGS Brain: No evidence of acute infarction, hemorrhage, hydrocephalus, extra-axial collection or mass lesion/mass effect. Subcortical white matter and periventricular small vessel ischemic changes. Vascular: Intracranial atherosclerosis. Skull: Normal. Negative for fracture or focal lesion. Sinuses/Orbits: The visualized paranasal sinuses are essentially clear. The mastoid air cells are unopacified. Other: Very mild soft tissue swelling overlying the right  frontal bone (series 2/image 16). CT CERVICAL SPINE FINDINGS Alignment: Normal cervical lordosis. Skull base and vertebrae: No acute fracture. No primary bone lesion or focal pathologic process. Soft tissues and spinal canal: No prevertebral fluid or swelling. No visible canal hematoma. Disc levels: Mild to moderate degenerative changes with bulky anterior osteophytosis along the anterior mid/lower cervical spine. Spinal canal is patent. Upper chest: Visualized lung apices are clear.  Other: Visualized thyroid is unremarkable. IMPRESSION: Very mild soft tissue swelling overlying the right frontal bone. No evidence of calvarial fracture. No evidence of acute intracranial abnormality. Small vessel ischemic changes. No evidence of traumatic injury to the cervical spine. Mild to moderate degenerative changes with bulky anterior osteophytosis. Electronically Signed   By: Julian Hy M.D.   On: 09/08/2019 18:06   CT Cervical Spine Wo Contrast  Result Date: 09/08/2019 CLINICAL DATA:  Fall, head injury, bruise to forehead, neck pain EXAM: CT HEAD WITHOUT CONTRAST CT CERVICAL SPINE WITHOUT CONTRAST TECHNIQUE: Multidetector CT imaging of the head and cervical spine was performed following the standard protocol without intravenous contrast. Multiplanar CT image reconstructions of the cervical spine were also generated. COMPARISON:  None. FINDINGS: CT HEAD FINDINGS Brain: No evidence of acute infarction, hemorrhage, hydrocephalus, extra-axial collection or mass lesion/mass effect. Subcortical white matter and periventricular small vessel ischemic changes. Vascular: Intracranial atherosclerosis. Skull: Normal. Negative for fracture or focal lesion. Sinuses/Orbits: The visualized paranasal sinuses are essentially clear. The mastoid air cells are unopacified. Other: Very mild soft tissue swelling overlying the right frontal bone (series 2/image 16). CT CERVICAL SPINE FINDINGS Alignment: Normal cervical lordosis. Skull base and vertebrae: No acute fracture. No primary bone lesion or focal pathologic process. Soft tissues and spinal canal: No prevertebral fluid or swelling. No visible canal hematoma. Disc levels: Mild to moderate degenerative changes with bulky anterior osteophytosis along the anterior mid/lower cervical spine. Spinal canal is patent. Upper chest: Visualized lung apices are clear. Other: Visualized thyroid is unremarkable. IMPRESSION: Very mild soft tissue swelling overlying the right  frontal bone. No evidence of calvarial fracture. No evidence of acute intracranial abnormality. Small vessel ischemic changes. No evidence of traumatic injury to the cervical spine. Mild to moderate degenerative changes with bulky anterior osteophytosis. Electronically Signed   By: Julian Hy M.D.   On: 09/08/2019 18:06   DG Knee Complete 4 Views Right  Result Date: 09/08/2019 CLINICAL DATA:  74 year old male with fall and trauma to the right knee. EXAM: RIGHT KNEE - COMPLETE 4+ VIEW COMPARISON:  None. FINDINGS: There is no acute fracture or dislocation. The bones are osteopenic. There is mild to moderate osteoarthritic changes with tricompartmental narrowing, meniscal chondrocalcinosis, and bone spurring. No significant joint effusion. The soft tissues are grossly unremarkable. Vascular calcifications noted. IMPRESSION: 1. No acute fracture or dislocation. 2. Osteoarthritis. Electronically Signed   By: Anner Crete M.D.   On: 09/08/2019 17:58   Procedures Procedures (including critical care time)  Medications Ordered in ED Medications - No data to display  ED Course  I have reviewed the triage vital signs and the nursing notes.  Pertinent labs & imaging results that were available during my care of the patient were reviewed by me and considered in my medical decision making (see chart for details).    MDM Rules/Calculators/A&P                      74 year old male presents to the ED for evaluation after a fall last night where he struck his head.  No LOC, slight hematoma to the forehead, no neurologic deficits on exam.  He does have some midline C-spine tenderness as well.  Also complains of mild right knee pain.  Had shoulder pain initially after fall but this has resolved and he now has full range of motion.  CTs of the head, cervical spine and right knee x-rays ordered.  Patient was sent from dialysis and did not complete his dialysis treatment but has normal vitals, no shortness  of breath or tachypnea, lungs are clear, he is not having any nausea or vomiting and is mentating well, feel he is stable for follow-up with his dialysis center tomorrow to be worked in assuming work-up today is reassuring.  I have ordered, reviewed and interpreted imaging myself.  CTs of the head and cervical spine show no acute intracranial injury, traumatic fracture or malalignment of the spine, there is small soft tissue swelling over the right forehead consistent with small hematoma noted on exam.  Plain films of the knee are unremarkable show no evidence of acute fracture.  Discussed reassuring imaging with patient.  At this time feel he is stable for discharge home, head injury precautions provided.  He has been instructed to call his dialysis center first thing tomorrow morning to be worked in for dialysis.  Final Clinical Impression(s) / ED Diagnoses Final diagnoses:  Fall, initial encounter  Injury of head, initial encounter  Acute pain of right knee    Rx / DC Orders ED Discharge Orders    None       Janet Berlin 09/08/19 2146    Lajean Saver, MD 09/08/19 2358

## 2019-09-09 DIAGNOSIS — Z992 Dependence on renal dialysis: Secondary | ICD-10-CM | POA: Diagnosis not present

## 2019-09-09 DIAGNOSIS — E1122 Type 2 diabetes mellitus with diabetic chronic kidney disease: Secondary | ICD-10-CM | POA: Diagnosis not present

## 2019-09-09 DIAGNOSIS — K767 Hepatorenal syndrome: Secondary | ICD-10-CM | POA: Diagnosis not present

## 2019-09-09 DIAGNOSIS — I10 Essential (primary) hypertension: Secondary | ICD-10-CM | POA: Diagnosis not present

## 2019-09-09 DIAGNOSIS — I8511 Secondary esophageal varices with bleeding: Secondary | ICD-10-CM | POA: Diagnosis not present

## 2019-09-09 DIAGNOSIS — R338 Other retention of urine: Secondary | ICD-10-CM | POA: Diagnosis not present

## 2019-09-09 DIAGNOSIS — K7290 Hepatic failure, unspecified without coma: Secondary | ICD-10-CM | POA: Diagnosis not present

## 2019-09-09 DIAGNOSIS — N186 End stage renal disease: Secondary | ICD-10-CM | POA: Diagnosis not present

## 2019-09-09 DIAGNOSIS — D631 Anemia in chronic kidney disease: Secondary | ICD-10-CM | POA: Diagnosis not present

## 2019-09-09 DIAGNOSIS — R3912 Poor urinary stream: Secondary | ICD-10-CM | POA: Diagnosis not present

## 2019-09-09 DIAGNOSIS — E1142 Type 2 diabetes mellitus with diabetic polyneuropathy: Secondary | ICD-10-CM | POA: Diagnosis not present

## 2019-09-09 DIAGNOSIS — K922 Gastrointestinal hemorrhage, unspecified: Secondary | ICD-10-CM | POA: Diagnosis not present

## 2019-09-11 DIAGNOSIS — N2581 Secondary hyperparathyroidism of renal origin: Secondary | ICD-10-CM | POA: Diagnosis not present

## 2019-09-11 DIAGNOSIS — N186 End stage renal disease: Secondary | ICD-10-CM | POA: Diagnosis not present

## 2019-09-11 DIAGNOSIS — Z992 Dependence on renal dialysis: Secondary | ICD-10-CM | POA: Diagnosis not present

## 2019-09-11 DIAGNOSIS — E878 Other disorders of electrolyte and fluid balance, not elsewhere classified: Secondary | ICD-10-CM | POA: Diagnosis not present

## 2019-09-12 DIAGNOSIS — E1142 Type 2 diabetes mellitus with diabetic polyneuropathy: Secondary | ICD-10-CM | POA: Diagnosis not present

## 2019-09-12 DIAGNOSIS — D631 Anemia in chronic kidney disease: Secondary | ICD-10-CM | POA: Diagnosis not present

## 2019-09-12 DIAGNOSIS — K922 Gastrointestinal hemorrhage, unspecified: Secondary | ICD-10-CM | POA: Diagnosis not present

## 2019-09-12 DIAGNOSIS — E1122 Type 2 diabetes mellitus with diabetic chronic kidney disease: Secondary | ICD-10-CM | POA: Diagnosis not present

## 2019-09-12 DIAGNOSIS — I8511 Secondary esophageal varices with bleeding: Secondary | ICD-10-CM | POA: Diagnosis not present

## 2019-09-12 DIAGNOSIS — I10 Essential (primary) hypertension: Secondary | ICD-10-CM | POA: Diagnosis not present

## 2019-09-12 DIAGNOSIS — R338 Other retention of urine: Secondary | ICD-10-CM | POA: Diagnosis not present

## 2019-09-12 DIAGNOSIS — K767 Hepatorenal syndrome: Secondary | ICD-10-CM | POA: Diagnosis not present

## 2019-09-12 DIAGNOSIS — K7290 Hepatic failure, unspecified without coma: Secondary | ICD-10-CM | POA: Diagnosis not present

## 2019-09-12 DIAGNOSIS — R3912 Poor urinary stream: Secondary | ICD-10-CM | POA: Diagnosis not present

## 2019-09-12 DIAGNOSIS — N186 End stage renal disease: Secondary | ICD-10-CM | POA: Diagnosis not present

## 2019-09-13 DIAGNOSIS — Z992 Dependence on renal dialysis: Secondary | ICD-10-CM | POA: Diagnosis not present

## 2019-09-13 DIAGNOSIS — N186 End stage renal disease: Secondary | ICD-10-CM | POA: Diagnosis not present

## 2019-09-14 DIAGNOSIS — R3912 Poor urinary stream: Secondary | ICD-10-CM | POA: Diagnosis not present

## 2019-09-14 DIAGNOSIS — K767 Hepatorenal syndrome: Secondary | ICD-10-CM | POA: Diagnosis not present

## 2019-09-14 DIAGNOSIS — K922 Gastrointestinal hemorrhage, unspecified: Secondary | ICD-10-CM | POA: Diagnosis not present

## 2019-09-14 DIAGNOSIS — I10 Essential (primary) hypertension: Secondary | ICD-10-CM | POA: Diagnosis not present

## 2019-09-14 DIAGNOSIS — D631 Anemia in chronic kidney disease: Secondary | ICD-10-CM | POA: Diagnosis not present

## 2019-09-14 DIAGNOSIS — K7290 Hepatic failure, unspecified without coma: Secondary | ICD-10-CM | POA: Diagnosis not present

## 2019-09-14 DIAGNOSIS — E1142 Type 2 diabetes mellitus with diabetic polyneuropathy: Secondary | ICD-10-CM | POA: Diagnosis not present

## 2019-09-14 DIAGNOSIS — N186 End stage renal disease: Secondary | ICD-10-CM | POA: Diagnosis not present

## 2019-09-14 DIAGNOSIS — R338 Other retention of urine: Secondary | ICD-10-CM | POA: Diagnosis not present

## 2019-09-14 DIAGNOSIS — E1122 Type 2 diabetes mellitus with diabetic chronic kidney disease: Secondary | ICD-10-CM | POA: Diagnosis not present

## 2019-09-14 DIAGNOSIS — I8511 Secondary esophageal varices with bleeding: Secondary | ICD-10-CM | POA: Diagnosis not present

## 2019-09-15 DIAGNOSIS — N186 End stage renal disease: Secondary | ICD-10-CM | POA: Diagnosis not present

## 2019-09-15 DIAGNOSIS — Z992 Dependence on renal dialysis: Secondary | ICD-10-CM | POA: Diagnosis not present

## 2019-09-18 ENCOUNTER — Encounter (HOSPITAL_COMMUNITY): Payer: Self-pay | Admitting: *Deleted

## 2019-09-18 ENCOUNTER — Other Ambulatory Visit: Payer: Self-pay

## 2019-09-18 ENCOUNTER — Inpatient Hospital Stay (HOSPITAL_COMMUNITY)
Admission: EM | Admit: 2019-09-18 | Discharge: 2019-09-22 | DRG: 811 | Disposition: A | Discharge status: Home Health | Payer: Medicare Other | Attending: Internal Medicine | Admitting: Internal Medicine

## 2019-09-18 ENCOUNTER — Emergency Department (HOSPITAL_COMMUNITY): Payer: Medicare Other

## 2019-09-18 DIAGNOSIS — Z8711 Personal history of peptic ulcer disease: Secondary | ICD-10-CM | POA: Diagnosis not present

## 2019-09-18 DIAGNOSIS — Z87891 Personal history of nicotine dependence: Secondary | ICD-10-CM | POA: Diagnosis not present

## 2019-09-18 DIAGNOSIS — D649 Anemia, unspecified: Secondary | ICD-10-CM | POA: Diagnosis not present

## 2019-09-18 DIAGNOSIS — E878 Other disorders of electrolyte and fluid balance, not elsewhere classified: Secondary | ICD-10-CM | POA: Diagnosis not present

## 2019-09-18 DIAGNOSIS — Z992 Dependence on renal dialysis: Secondary | ICD-10-CM | POA: Diagnosis not present

## 2019-09-18 DIAGNOSIS — R5383 Other fatigue: Secondary | ICD-10-CM | POA: Diagnosis not present

## 2019-09-18 DIAGNOSIS — I4891 Unspecified atrial fibrillation: Secondary | ICD-10-CM | POA: Diagnosis not present

## 2019-09-18 DIAGNOSIS — N2581 Secondary hyperparathyroidism of renal origin: Secondary | ICD-10-CM | POA: Diagnosis not present

## 2019-09-18 DIAGNOSIS — I8500 Esophageal varices without bleeding: Secondary | ICD-10-CM | POA: Diagnosis not present

## 2019-09-18 DIAGNOSIS — N186 End stage renal disease: Secondary | ICD-10-CM | POA: Diagnosis not present

## 2019-09-18 DIAGNOSIS — R531 Weakness: Secondary | ICD-10-CM

## 2019-09-18 DIAGNOSIS — Z823 Family history of stroke: Secondary | ICD-10-CM

## 2019-09-18 DIAGNOSIS — D62 Acute posthemorrhagic anemia: Secondary | ICD-10-CM | POA: Diagnosis not present

## 2019-09-18 DIAGNOSIS — I120 Hypertensive chronic kidney disease with stage 5 chronic kidney disease or end stage renal disease: Secondary | ICD-10-CM | POA: Diagnosis present

## 2019-09-18 DIAGNOSIS — M898X9 Other specified disorders of bone, unspecified site: Secondary | ICD-10-CM | POA: Diagnosis present

## 2019-09-18 DIAGNOSIS — R58 Hemorrhage, not elsewhere classified: Secondary | ICD-10-CM

## 2019-09-18 DIAGNOSIS — K2950 Unspecified chronic gastritis without bleeding: Secondary | ICD-10-CM | POA: Diagnosis not present

## 2019-09-18 DIAGNOSIS — N250 Renal osteodystrophy: Secondary | ICD-10-CM | POA: Diagnosis not present

## 2019-09-18 DIAGNOSIS — K317 Polyp of stomach and duodenum: Secondary | ICD-10-CM | POA: Diagnosis present

## 2019-09-18 DIAGNOSIS — K766 Portal hypertension: Secondary | ICD-10-CM | POA: Diagnosis present

## 2019-09-18 DIAGNOSIS — Z800 Family history of malignant neoplasm of digestive organs: Secondary | ICD-10-CM

## 2019-09-18 DIAGNOSIS — K3189 Other diseases of stomach and duodenum: Secondary | ICD-10-CM | POA: Diagnosis not present

## 2019-09-18 DIAGNOSIS — Z20822 Contact with and (suspected) exposure to covid-19: Secondary | ICD-10-CM | POA: Diagnosis not present

## 2019-09-18 DIAGNOSIS — Z803 Family history of malignant neoplasm of breast: Secondary | ICD-10-CM | POA: Diagnosis not present

## 2019-09-18 DIAGNOSIS — E1129 Type 2 diabetes mellitus with other diabetic kidney complication: Secondary | ICD-10-CM | POA: Diagnosis not present

## 2019-09-18 DIAGNOSIS — Z794 Long term (current) use of insulin: Secondary | ICD-10-CM | POA: Diagnosis not present

## 2019-09-18 DIAGNOSIS — E119 Type 2 diabetes mellitus without complications: Secondary | ICD-10-CM

## 2019-09-18 DIAGNOSIS — E1122 Type 2 diabetes mellitus with diabetic chronic kidney disease: Secondary | ICD-10-CM | POA: Diagnosis not present

## 2019-09-18 DIAGNOSIS — A0472 Enterocolitis due to Clostridium difficile, not specified as recurrent: Secondary | ICD-10-CM | POA: Diagnosis present

## 2019-09-18 DIAGNOSIS — F419 Anxiety disorder, unspecified: Secondary | ICD-10-CM | POA: Diagnosis present

## 2019-09-18 DIAGNOSIS — H9190 Unspecified hearing loss, unspecified ear: Secondary | ICD-10-CM | POA: Diagnosis not present

## 2019-09-18 DIAGNOSIS — K921 Melena: Secondary | ICD-10-CM | POA: Diagnosis not present

## 2019-09-18 DIAGNOSIS — R0602 Shortness of breath: Secondary | ICD-10-CM | POA: Diagnosis not present

## 2019-09-18 DIAGNOSIS — K7031 Alcoholic cirrhosis of liver with ascites: Secondary | ICD-10-CM | POA: Diagnosis not present

## 2019-09-18 DIAGNOSIS — K2970 Gastritis, unspecified, without bleeding: Secondary | ICD-10-CM | POA: Diagnosis not present

## 2019-09-18 DIAGNOSIS — K7460 Unspecified cirrhosis of liver: Secondary | ICD-10-CM | POA: Diagnosis present

## 2019-09-18 DIAGNOSIS — K922 Gastrointestinal hemorrhage, unspecified: Secondary | ICD-10-CM | POA: Diagnosis not present

## 2019-09-18 DIAGNOSIS — K7030 Alcoholic cirrhosis of liver without ascites: Secondary | ICD-10-CM | POA: Diagnosis present

## 2019-09-18 DIAGNOSIS — D631 Anemia in chronic kidney disease: Secondary | ICD-10-CM | POA: Diagnosis present

## 2019-09-18 LAB — RETICULOCYTES
Immature Retic Fract: 44.3 % — ABNORMAL HIGH (ref 2.3–15.9)
RBC.: 1.71 MIL/uL — ABNORMAL LOW (ref 4.22–5.81)
Retic Count, Absolute: 164 10*3/uL (ref 19.0–186.0)
Retic Ct Pct: 9.6 % — ABNORMAL HIGH (ref 0.4–3.1)

## 2019-09-18 LAB — COMPREHENSIVE METABOLIC PANEL
ALT: 19 U/L (ref 0–44)
AST: 42 U/L — ABNORMAL HIGH (ref 15–41)
Albumin: 2.8 g/dL — ABNORMAL LOW (ref 3.5–5.0)
Alkaline Phosphatase: 98 U/L (ref 38–126)
Anion gap: 13 (ref 5–15)
BUN: 22 mg/dL (ref 8–23)
CO2: 23 mmol/L (ref 22–32)
Calcium: 8.1 mg/dL — ABNORMAL LOW (ref 8.9–10.3)
Chloride: 96 mmol/L — ABNORMAL LOW (ref 98–111)
Creatinine, Ser: 2.75 mg/dL — ABNORMAL HIGH (ref 0.61–1.24)
GFR calc Af Amer: 26 mL/min — ABNORMAL LOW (ref >60)
GFR calc non Af Amer: 22 mL/min — ABNORMAL LOW (ref >60)
Glucose, Bld: 126 mg/dL — ABNORMAL HIGH (ref 70–99)
Potassium: 3.8 mmol/L (ref 3.5–5.1)
Sodium: 132 mmol/L — ABNORMAL LOW (ref 135–145)
Total Bilirubin: 0.9 mg/dL (ref 0.3–1.2)
Total Protein: 7 g/dL (ref 6.5–8.1)

## 2019-09-18 LAB — CBC WITH DIFFERENTIAL/PLATELET
Abs Immature Granulocytes: 0.01 10*3/uL (ref 0.00–0.07)
Basophils Absolute: 0 10*3/uL (ref 0.0–0.1)
Basophils Relative: 0 %
Eosinophils Absolute: 0.3 10*3/uL (ref 0.0–0.5)
Eosinophils Relative: 9 %
HCT: 18.7 % — ABNORMAL LOW (ref 39.0–52.0)
Hemoglobin: 5.4 g/dL — CL (ref 13.0–17.0)
Immature Granulocytes: 0 %
Lymphocytes Relative: 11 %
Lymphs Abs: 0.4 10*3/uL — ABNORMAL LOW (ref 0.7–4.0)
MCH: 31.2 pg (ref 26.0–34.0)
MCHC: 28.9 g/dL — ABNORMAL LOW (ref 30.0–36.0)
MCV: 108.1 fL — ABNORMAL HIGH (ref 80.0–100.0)
Monocytes Absolute: 0.3 10*3/uL (ref 0.1–1.0)
Monocytes Relative: 7 %
Neutro Abs: 2.5 10*3/uL (ref 1.7–7.7)
Neutrophils Relative %: 73 %
Platelets: 124 10*3/uL — ABNORMAL LOW (ref 150–400)
RBC: 1.73 MIL/uL — ABNORMAL LOW (ref 4.22–5.81)
RDW: 22.4 % — ABNORMAL HIGH (ref 11.5–15.5)
WBC: 3.4 10*3/uL — ABNORMAL LOW (ref 4.0–10.5)
nRBC: 0 % (ref 0.0–0.2)

## 2019-09-18 LAB — POC OCCULT BLOOD, ED: Fecal Occult Bld: POSITIVE — AB

## 2019-09-18 LAB — RESPIRATORY PANEL BY RT PCR (FLU A&B, COVID)
Influenza A by PCR: NEGATIVE
Influenza B by PCR: NEGATIVE
SARS Coronavirus 2 by RT PCR: NEGATIVE

## 2019-09-18 LAB — PROTIME-INR
INR: 1.2 (ref 0.8–1.2)
Prothrombin Time: 15 seconds (ref 11.4–15.2)

## 2019-09-18 LAB — PREPARE RBC (CROSSMATCH)

## 2019-09-18 LAB — MAGNESIUM: Magnesium: 2.1 mg/dL (ref 1.7–2.4)

## 2019-09-18 MED ORDER — ACETAMINOPHEN 325 MG PO TABS
650.0000 mg | ORAL_TABLET | Freq: Four times a day (QID) | ORAL | Status: DC | PRN
  Administered 2019-09-20 – 2019-09-22 (×4): 650 mg via ORAL
  Filled 2019-09-18 (×4): qty 2

## 2019-09-18 MED ORDER — ONDANSETRON HCL 4 MG/2ML IJ SOLN: 4.0000 mg | Freq: Four times a day (QID) | INTRAMUSCULAR | Status: DC | PRN

## 2019-09-18 MED ORDER — PANTOPRAZOLE SODIUM 40 MG IV SOLR
40.0000 mg | Freq: Two times a day (BID) | INTRAVENOUS | Status: DC
  Administered 2019-09-19: 40 mg via INTRAVENOUS
  Filled 2019-09-18: qty 40

## 2019-09-18 MED ORDER — PANTOPRAZOLE SODIUM 40 MG IV SOLR
40.0000 mg | Freq: Once | INTRAVENOUS | Status: AC
  Administered 2019-09-18: 40 mg via INTRAVENOUS
  Filled 2019-09-18: qty 40

## 2019-09-18 MED ORDER — INSULIN ASPART 100 UNIT/ML ~~LOC~~ SOLN
0.0000 [IU] | Freq: Four times a day (QID) | SUBCUTANEOUS | Status: DC
  Administered 2019-09-19 – 2019-09-22 (×7): 1 [IU] via SUBCUTANEOUS

## 2019-09-18 MED ORDER — SODIUM CHLORIDE 0.9% FLUSH
3.0000 mL | Freq: Two times a day (BID) | INTRAVENOUS | Status: DC
  Administered 2019-09-19: 3 mL via INTRAVENOUS
  Administered 2019-09-19: 10 mL via INTRAVENOUS
  Administered 2019-09-20 – 2019-09-22 (×5): 3 mL via INTRAVENOUS

## 2019-09-18 MED ORDER — FUROSEMIDE 10 MG/ML IJ SOLN
40.0000 mg | Freq: Once | INTRAMUSCULAR | Status: AC
  Administered 2019-09-19: 40 mg via INTRAVENOUS
  Filled 2019-09-18: qty 4

## 2019-09-18 MED ORDER — RIFAXIMIN 550 MG PO TABS
550.0000 mg | ORAL_TABLET | Freq: Two times a day (BID) | ORAL | Status: DC
  Administered 2019-09-19 – 2019-09-22 (×8): 550 mg via ORAL
  Filled 2019-09-18 (×8): qty 1

## 2019-09-18 MED ORDER — SODIUM CHLORIDE 0.9% IV SOLUTION: Freq: Once | INTRAVENOUS | Status: AC

## 2019-09-18 MED ORDER — SODIUM CHLORIDE 0.9 % IV BOLUS
250.0000 mL | Freq: Once | INTRAVENOUS | Status: AC
  Administered 2019-09-18: 250 mL via INTRAVENOUS

## 2019-09-18 MED ORDER — SODIUM CHLORIDE 0.9 % IV SOLN
1.0000 g | INTRAVENOUS | Status: DC
  Administered 2019-09-19: 1 g via INTRAVENOUS
  Filled 2019-09-18: qty 10

## 2019-09-18 MED ORDER — INSULIN GLARGINE 100 UNIT/ML ~~LOC~~ SOLN
30.0000 [IU] | Freq: Every day | SUBCUTANEOUS | Status: DC
  Administered 2019-09-19 (×2): 30 [IU] via SUBCUTANEOUS
  Filled 2019-09-18 (×7): qty 0.3

## 2019-09-18 MED ORDER — SODIUM CHLORIDE 0.9 % IV SOLN: 10.0000 mL/h | Freq: Once | INTRAVENOUS | Status: AC

## 2019-09-18 NOTE — ED Notes (Signed)
States he was placed on oxygen during his dialysis today

## 2019-09-18 NOTE — ED Triage Notes (Signed)
States he was advised  By dialysis that his hemoglobin is low

## 2019-09-18 NOTE — H&P (Signed)
History and Physical    PLEASE NOTE THAT DRAGON DICTATION SOFTWARE WAS USED IN THE CONSTRUCTION OF THIS NOTE.   Jeffrey [name] ZYY:[id number] DOB: [birth year] DOA: 09/18/2019  PCP: Monico Blitz, MD Patient coming from: home   I have personally briefly reviewed patient's old medical records in [place]  Chief Complaint: Low hemoglobin level  HPI: Jeffrey [name] is a 74 y.o. male with medical history significant for end-stage renal disease on M/W/F hemodialysis, chronic anemia with baseline hemoglobin 4.8-8.8, alcoholic cirrhosis, type 2 diabetes mellitus, who is admitted to [facility] on 09/18/2019 with acute on chronic anemia after presenting from home to [facility] Emergency Department at the direction of hemodialysis center for evaluation of low hemoglobin.   The patient reports that he was undergoing his regularly scheduled hemodialysis earlier today at which time routine labs reportedly revealed hemoglobin of 6.0 relative to his baseline range of 7.0-8.5.  The patient reports that he was able to complete the totality of today's hemodialysis session, but was instructed by the hemodialysis center to subsequently present to the emergency department for further evaluation and management of this interval decline in hemoglobin value.  The patient denies any associated abdominal pain, melena, or hematochezia.  He reports mild nausea in the absence of any vomiting, and also denies any associated abdominal pain.  Denies any recent trauma, and reports that he has on no blood thinners at home, including no aspirin.  He reports 1 to 2 days of mild shortness of breath in the absence of any associated orthopnea, PND, or worsening of peripheral edema.  He also denies any associated chest pain, diaphoresis, palpitations, dizziness, presyncope, or syncope.  He also notes 1 to 2 days of generalized weakness in the absence of any acute focal weakness.  The patient reports that he was diagnosed with C.  difficile in either December 2020 or January 2021, and conveys that he has been on a prolonged taper of oral vancomycin since that time.  He reports that he is now down to 1 dose per day of this medication.  While maintaining good compliance with his outpatient oral vancomycin, the patient reports that his loose stools have continued to improve, denies any associated melena or hematochezia.  Per chart review, it appears that the patient has a history of upper gastrointestinal bleed several years ago, at which time EGD revealed bleeding duodenal ulcer.  There is also documentation of a history of esophageal varices.  He underwent colonoscopy in October 2013 which reportedly showed evidence of colonic AVM, diverticulosis, as well as a tubular adenoma.  The patient reports that he was scheduled to undergo routine follow-up colonoscopy in January 2021, although this was deferred in the setting of recent diagnosis of C. difficile colitis.  Of note, in the setting of history of alcoholic cirrhosis, the patient routinely follows with Dr. Gala Romney as his outpatient gastroenterologist.  Denies any recent subjective fever, chills, rigors, or generalized myalgias. Denies any recent headache, neck stiffness, rhinitis, rhinorrhea, sore throat, cough, or rash. No recent traveling or known COVID-19 exposures. Denies dysuria, gross hematuria, or change in urinary urgency/frequency.     ED Course:  Vital signs in the ED were notable for the following: Temperature max 97.7; heart rate 81-83; blood pressure 109/44 - 110/56; respiratory rate 17-20; oxygen saturation 98 to 99% on room air.  Labs were notable for the following: CMP notable for the following: Sodium 132, relative to 132 on 09/07/2019, potassium 3.8, chloride 96, bicarbonate 23,  anion gap 13, BUN 22 relative to most recent prior value of 14 on 09/07/2019, calcium 8.1, albumin 2.8, alkaline phosphatase 98, AST 42, ALT 19, total bilirubin 0.9.  CBC notable for white  blood cell count of 3400, hemoglobin 5.4 relative to most recent prior value of 7.6 on 09/07/2019, MCV 108, MCHC 29, RDW 22.4, platelets 124 compared to most recent prior value of 153 on 09/07/2019.  Rectal exam performed in the ED revealed Hemoccult positive finding.  Routine screening nasopharyngeal COVID-19 PCR was checked in the ED this evening, with result currently pending.  Chest x-ray showed cardiomegaly with mild vascular congestion in the absence of any evidence of infiltrate, effusion, or pneumothorax.  EKG shows sinus rhythm with ventricular rate of 85, T wave flattening in inferior and lateral leads, appearing unchanged relative to most recent prior EKG from 08/17/2019, although artifact present in this previous EKG somewhat limits interpretation thereof; no evidence of ST changes, including no evidence of ST elevation.  The emergency department physician discussed the patient's case with the on-call gastroenterologist, Dr. Laural Golden, who recommended admission to the hospitalist service for further evaluation and management of acute on chronic anemia. Dr. Laural Golden recommended Protonix IV twice daily, clear liquids overnight, and plans to evaluate the patient in the morning (09/19/19) for determination of necessity of endoscopic evaluation.  While in the ED, the following were administered: IV normal saline x250 cc bolus.  Protonix 40 mg IV x1.  Additionally, the patient was typed and screened for 2 units PVC, with initiation of transfusion of first unit commenced while still in the ED.    Review of Systems: As per HPI otherwise 10 point review of systems negative.   Past Medical History:  Diagnosis Date  . A-fib [facility])    when initially stating dialysis january 2021 at [facility] per spouse; never heard anything else about it  . Alcoholic cirrhosis ([place])    patient reports completing hep a and b vaccines in 2006  . Anemia    has had 3 units of prbcs 2013  . Anxiety   . Asymptomatic  gallstones    Ultrasound in 2006  . Cholelithiasis   . Chronic kidney disease   . Depression   . Diabetes ([place])   . Duodenal ulcer with hemorrhage    per patient in 2006 or 2007, records have been requested  . 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 HOH (hard of hearing)   . Hypertension   . Splenomegaly    Ultrasound in 2006  . Tubular adenoma of colon     Past Surgical History:  Procedure Laterality Date  . APPENDECTOMY    . AV FISTULA PLACEMENT Left 08/17/2019   Procedure: ARTERIOVENOUS (AV) FISTULA CREATION VERSES ARTERIOVENOUS GRAFT;  Surgeon: [name], MD;  Location: [facility];  Service: Vascular;  Laterality: Left;  . BIOPSY  02/03/2018   Procedure: BIOPSY;  Surgeon: Daneil Dolin, MD;  [facility];  Service: Endoscopy;;  bx of gastric polyps  . CATARACT EXTRACTION Right   . COLONOSCOPY  03/10/2005   Rectal polyp as described above, removed with snare. Left sided  diverticula. The remainder of the colonic mucosa appeared normal. Inflammed polyp on path.  . COLONOSCOPY  04/1999   Dr. Thea Silversmith polyps removed,  Path showed hyperplastic  . COLONOSCOPY  10/2015   Dr. Britta Mccreedy: diverticulosis, single sessile tubular adenoma 3-81mm in size removed from  descending colon.   . COLONOSCOPY WITH ESOPHAGOGASTRODUODENOSCOPY (EGD)  03/31/2012   [id number] AVMs. Colonic diverticulosis. tubular adenoma colon  . ESOPHAGEAL BANDING N/A 02/03/2018   Procedure: ESOPHAGEAL BANDING;  Surgeon: Daneil Dolin, MD;  [facility];  Service: Endoscopy;  Laterality: N/A;  . ESOPHAGOGASTRODUODENOSCOPY  01/15/2005   Three columns grade 1 to 2 esophageal varices, otherwise normal esophageal mucosa.  Esophagus was not manipulated otherwise./Nodularity of the antrum with overlying erosions, nonspecific finding. Path showed rare H.pylori  . ESOPHAGOGASTRODUODENOSCOPY  09/2008   Dr. Gaylene Brooks columns of grade 2-3 esoph varices, only  one column was prominent. Portal gastropathy, multiple gastrc polyps at antrum, two were 2cm with black eschar, bulbar polyps, bulbar erosions  . ESOPHAGOGASTRODUODENOSCOPY  11/2004   Dr. Brantley Stage 3 esoph varices  . ESOPHAGOGASTRODUODENOSCOPY  03/31/2012   RMR: 4 columns(3-GR 2, 1-GR1) non-bleeding esophageal varices, portal gastropathy, small HH, early GAVE, multiple gastric polyps   . ESOPHAGOGASTRODUODENOSCOPY (EGD) WITH PROPOFOL N/A 02/03/2018   Dr. Gala Romney: Esophageal varices, 3 columns grade 1-2.  Portal hypertensive gastropathy.  Multiple gastric polyps, biopsy consistent with hyperplastic.  [name] GASTRIC VARICES BANDING  03/31/2012   Procedure: GASTRIC VARICES BANDING;  Surgeon: Daneil Dolin, MD;  [facility];  Service: Endoscopy;  Laterality: N/A;  . UMBILICAL HERNIA REPAIR  2017   [facility]    Social History:  reports that he quit smoking about 33 years ago. His smoking use included cigarettes. He has a 12.50 pack-year smoking history. He has never used smokeless tobacco. He reports that he does not drink alcohol or use drugs.   No Known Allergies  Family History  Problem Relation Age of Onset  . Colon cancer Father 54       deceased age 30  . Breast cancer Sister        deceased  . Breast cancer Sister   . Stroke Mother   . Lung cancer Neg Hx   . Ovarian cancer Neg Hx     Prior to Admission medications   Medication Sig Start Date End Date Taking? Authorizing Provider  Ascorbic Acid (VITAMIN C) 1000 MG tablet Take 1,000 mg by mouth 2 (two) times daily.    [provider]  cholestyramine Lucrezia Starch) 4 GM/DOSE powder Take 4 g by mouth 2 (two) times daily with a meal.  08/03/19   [provider]  Continuous Blood Gluc Sensor (FREESTYLE LIBRE 14 DAY SENSOR) MISC Inject 1 each into the skin every 14 (fourteen) days. Use as directed. 12/15/17   Cassandria Anger, MD  furosemide (LASIX) 80 MG tablet Take 80 mg by mouth daily. 08/07/19   [provider]  gabapentin (NEURONTIN) 100 MG capsule Take 100 mg by mouth at bedtime.  01/07/19   [provider]  HYDROcodone-acetaminophen (NORCO) 10-325 MG tablet Take 1 tablet by mouth every 6 (six) hours as needed for moderate pain.  04/05/19   [provider]  insulin degludec (TRESIBA FLEXTOUCH) 200 UNIT/ML FlexTouch Pen Inject 120 mLs into the skin at bedtime. 04/07/19   [provider]  insulin lispro (HUMALOG) 100 UNIT/ML KiwkPen Inject 50 Units into the skin 3 (three) times daily.     [provider]  iron polysaccharides (NIFEREX) 150 MG capsule Take 150 mg by mouth at bedtime.     [provider]  metoprolol tartrate (LOPRESSOR) 25 MG tablet Take 25 mg by mouth 2 (two) times daily. 08/07/19   [provider]  milk thistle 175 MG tablet  Take 175 mg by mouth 3 (three) times daily.     [provider]  mirtazapine (REMERON) 15 MG tablet Take 15 mg by mouth at bedtime.    [provider]  Multiple Vitamin (MULTIVITAMIN WITH MINERALS) TABS tablet Take 1 tablet by mouth daily.    [provider]  omega-3 acid ethyl esters (LOVAZA) 1 g capsule Take 2 g by mouth 2 (two) times daily.  04/07/19   [provider]  omeprazole (PRILOSEC) 40 MG capsule Take 40 mg by mouth daily.    [provider]  pravastatin (PRAVACHOL) 20 MG tablet Take 20 mg by mouth daily.    [provider]  Probiotic Product (PROBIOTIC DAILY PO) Take 1 tablet by mouth daily.     [provider]  propranolol (INDERAL) 40 MG tablet TAKE 40MG  AT BREAKFAST, 40MG  AT LUNCH, 20 MG AT BEDTIME Patient not taking: Reported on 05/02/2019 01/13/19   Mahala Menghini, PA-C  rifaximin (XIFAXAN) 550 MG TABS tablet Take 1 tablet (550 mg total) by mouth 2 (two) times daily. 01/13/19   Mahala Menghini, PA-C  sitaGLIPtin (JANUVIA) 50 MG tablet Take 50 mg by mouth daily.     [provider]  spironolactone (ALDACTONE) 50 MG tablet  Take 50 mg by mouth daily.     [provider]  thiamine 100 MG tablet Take 100 mg by mouth daily. Vitamin B-1    [provider]  tiZANidine (ZANAFLEX) 2 MG tablet Take 2 mg by mouth every 6 (six) hours as needed for muscle spasms.     [provider]  triamcinolone cream (KENALOG) 0.1 % Apply 1 application topically 2 (two) times daily as needed (dry skin Itching).  12/30/17   [provider]  vancomycin (VANCOCIN) 125 MG capsule Take 1 capsule (125 mg total) by mouth 4 (four) times daily. 05/20/19   Rogene Houston, MD  venlafaxine (EFFEXOR) 75 MG tablet Take 75 mg by mouth 3 (three) times daily with meals.     [provider]     Objective    Physical Exam: Vitals:   09/18/19 1652 09/18/19 1813 09/18/19 1819  BP: (!) 109/44 (!) 110/56   Pulse: 83 89   Resp: 17 20   Temp: 97.7 F (36.5 C)    TempSrc: Oral    SpO2: (!) 74% 100% 100%    General: appears to be stated age; alert, oriented Skin: warm, dry, no rash Head:  AT/[place] Eyes:  PEARL b/l, EOMI Mouth:  Oral mucosa membranes appear moist, normal dentition Neck: supple; trachea midline Heart:  RRR; did not appreciate any M/R/G Lungs: CTAB, did not appreciate any wheezes, rales, or rhonchi Abdomen: + BS; soft, ND, NT Extremities: Hyperpigmentation and mild induration associated with bilateral lower extremities distal to the bilateral knees with trace to 1+ edema; no muscle wasting Neuro: strength and sensation intact in upper and lower extremities b/l  Labs on Admission: I have personally reviewed following labs and imaging studies  CBC: Recent Labs  Lab 09/18/19 1715  WBC 3.4*  NEUTROABS 2.5  HGB 5.4*  HCT 18.7*  MCV 108.1*  PLT 174*   Basic Metabolic Panel: Recent Labs  Lab 09/18/19 1715  NA 132*  K 3.8  CL 96*  CO2 23  GLUCOSE 126*  BUN 22  CREATININE 2.75*  CALCIUM 8.1*   GFR: Estimated Creatinine Clearance: 29.2 mL/min (A) (by C-G formula based on SCr of  2.75 mg/dL (H)). Liver Function Tests: Recent Labs  Lab 09/18/19 1715  AST 42*  ALT 19  ALKPHOS 98  BILITOT 0.9  PROT 7.0  ALBUMIN 2.8*   No results for input(s): LIPASE, AMYLASE in the last 168 hours. No results for input(s): AMMONIA in the last 168 hours. Coagulation Profile: No results for input(s): INR, PROTIME in the last 168 hours. Cardiac Enzymes: No results for input(s): CKTOTAL, CKMB, CKMBINDEX, TROPONINI in the last 168 hours. BNP (last 3 results) No results for input(s): PROBNP in the last 8760 hours. HbA1C: No results for input(s): HGBA1C in the last 72 hours. CBG: No results for input(s): GLUCAP in the last 168 hours. Lipid Profile: No results for input(s): CHOL, HDL, LDLCALC, TRIG, CHOLHDL, LDLDIRECT in the last 72 hours. Thyroid Function Tests: No results for input(s): TSH, T4TOTAL, FREET4, T3FREE, THYROIDAB in the last 72 hours. Anemia Panel: No results for input(s): VITAMINB12, FOLATE, FERRITIN, TIBC, IRON, RETICCTPCT in the last 72 hours. Urine analysis:    Component Value Date/Time   COLORURINE AMBER (A) 03/31/2016 1703   APPEARANCEUR CLEAR 03/31/2016 1703   LABSPEC 1.011 03/31/2016 1703   PHURINE 7.5 03/31/2016 1703   GLUCOSEU NEGATIVE 03/31/2016 1703   HGBUR NEGATIVE 03/31/2016 1703   BILIRUBINUR NEGATIVE 03/31/2016 1703   KETONESUR NEGATIVE 03/31/2016 1703   PROTEINUR NEGATIVE 03/31/2016 1703   NITRITE NEGATIVE 03/31/2016 1703   LEUKOCYTESUR NEGATIVE 03/31/2016 1703    Radiological Exams on Admission: No results found.   EKG: Independently reviewed, with result as described above.    Assessment/Plan   Jeffrey [name] is a 74 y.o. male with medical history significant for end-stage renal disease on M/W/F hemodialysis, chronic anemia with baseline hemoglobin 0.6-3.0, alcoholic cirrhosis, type 2 diabetes mellitus, who is admitted to [facility] on 09/18/2019 with acute on chronic anemia after presenting from home to [facility] Emergency  Department at the direction of hemodialysis center for evaluation of low hemoglobin.    Principal Problem:   Acute on chronic anemia Active Problems:   Cirrhosis (HCC)   Generalized weakness   DM2 (diabetes mellitus, type 2) (HCC)   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[place])   #) Acute on chronic anemia: In the setting of a documented history of chronic multifactorial anemia with contributions from anemia of chronic kidney disease as well as chronic iron deficiency anemia associated with baseline hemoglobin of 7.0-8.5, routine outpatient labs performed earlier today revealed hemoglobin of 6.0 relative to most recent prior value of 7.6 when checked on 09/07/2019, with CBC performed upon arrival in the ED this evening noted to demonstrate hemoglobin of 5.4.  Rectal exam performed in the ED associated with Hemoccult positive finding, although the patient denies any recent melena, hematochezia, or hematemesis. Will consider this evening's presentation to be consistent with symptomatic anemia given the patient's report of 1 to 2 days of mild shortness of breath.  He appears hemodynamically stable at this time. Has been typed and screened for 2 units PRBC, with transfusion of first unit initiated in the ED.  Differential includes possibility of acute gastrointestinal bleed, although the source of such is currently not clear.  If there is an upper gastrointestinal source, suspect that this is a slow bleed given the patient's overall hemodynamic stability and relative asymptomatic nature of his presentation.  While the patient does have a history of esophageal varices, presentation appears much less suggestive of bleeding esophageal varices given aforementioned apparent hemodynamic/symptomatic stability at the present time.  Consequently, will refrain from initiation of octreotide drip at this time.  However, given the patient's history of cirrhosis, will start Rocephin for SBP prophylaxis. Differential for potential  lower gastrointestinal source includes a history of colonic AVMs as well as diverticulosis noted on previous colonoscopy, as further described above.  Of note, the patient is on no blood thinners at home, including no aspirin.   Patient's case was with the on-call gastroenterologist, Dr. Laural Golden, who recommended admission to the hospitalist service for further evaluation and management of acute on chronic anemia. Dr. Laural Golden recommended Protonix IV twice daily, clear liquids overnight, and plans to evaluate the patient in the morning (09/19/19) for determination of necessity of endoscopic evaluation.   Plan: Continue slow transfusion of 2 units PRBC, with each unit to be transfused over 3 hours, with close interval monitoring for evidence of volume overload given the patient's history of ESRD.  Lasix 40 mg IV x1 following completion of transfusion, as the patient reportedly does continue to produce a small amount of urine.  Repeat CBC ordered to be checked following completion aforementioned transfusion.  Repeat H&H at 8 AM tomorrow morning.  Add on INR.  Monitor on telemetry.  Monitor continuous pulse oximetry.  Refrain from pharmacologic DVT prophylaxis.  SCDs.  Per recommendations of on-call gastroenterology, Dr. Laural Golden: Protonix 40 mg IV twice daily as well as clear liquid diet, will plan for Dr. Laural Golden to evaluate pt in the morning for determination of need of endoscopic evaluation.  Rocephin for SBP prophylaxis, as described above. Will add-on the following to pretransfusion blood specimen: Total iron, TIBC, ferritin, folic acid level, MMA, reticulocyte count, and peripheral smear.     #) Generalized weakness: The patient reports 1 to 2 days of generalized weakness in the absence of any acute focal neurologic deficits.  Suspect contribution from presenting acute on chronic anemia, as further described above.  Plan: Work-up and management of presenting acute on chronic anemia, including PRBC  transfusion as above. Could consider PT consult.       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  On hemodialysis on a Monday, Wednesday, Friday schedule, with most recent hemodialysis session completed earlier today.  No clinical or laboratory evidence to suggest the need for urgent overnight hemodialysis.  Plan: Monitor strict I's and O's and daily weights.  Repeat CMP in the morning and check serum phosphorus level.  Add on serum magnesium level.     #) Alcoholic cirrhosis: In the setting of history of alcohol abuse, which the patient reports that he completely quit consuming alcohol 6-7 years ago, the patient possesses a documented history of alcoholic cirrhosis.  Unable to calculate meld score at this time in the absence of current INR level.  Patient cirrhosis is complicated by portal hypertension for which he is on daily Lasix as well as spironolactone at home.  His cirrhosis is managed by Dr. Gala Romney as his outpatient gastroenterologist.  Plan: Add on INR to labs already collected in the ED this evening, waiting for purpose of collecting meld score.  Repeat CMP in the morning, at which time will also recheck INR. Will home Lasix/spironolactone for now in the setting of worsening acute on chronic anemia with formal gastroenterology consultation to occur in the morning.  Monitor strict I's and O's.      #) History of C. difficile colitis: Diagnosis reportedly made greater than 3 months ago in January 2021.  Has subsequently been on a slow oral vancomycin taper, with patient reporting that he is now on Q daily PO  Vanc.  Reports improvement in previous loose stool.  Denies any presenting abdominal discomfort, does not appear septic.  Plan: inpatient pharmacy consult for confirmation of patient's report of daily oral vancomycin dosing.  I have refrained from oral vancomycin, pending this clarification by inpatient pharmacy.     #) Type 2 diabetes  mellitus: It appears that most recent hemoglobin A1c was 8.2% when checked on 06/02/2018.  Outpatient insulin regimen appears to consist of Tresiba 120 units subcu nightly as well as scheduled Humalog 50 units subcu 3 times daily.  Additionally, patient on Januvia at home.  Presenting blood sugar per presenting CMP noted to be 126.  In the setting of end-stage renal disease as well as current clear liquid diet, will proceed conservatively for now with basal and short acting insulin during this hospitalization.  As such, will start with basal insulin in the form of Lantus 30 units subcu nightly. Will also initially refrain from scheduled Humalog, but rather will initiate Accu-Cheks every 6 hours with sliding scale insulin.  As it appears that the most recent hemoglobin A1c occurred over 1 year ago, even with review of Care Everywhere, will check hemoglobin A1c at this time.  Plan: Lantus 30 units subcu nightly, as above.  Accu-Cheks before every meal and at bedtime with sliding scale insulin.  Check hemoglobin A1c in the morning.    DVT prophylaxis: scd's  Code Status: Full code Family Communication: none Disposition Plan: Per Rounding Team Consults called: Case was discussed with the on-call gastroenterologist, Dr. Laural Golden, as further described above.  Admission status: observation; stepdown unit.     PLEASE NOTE THAT DRAGON DICTATION SOFTWARE WAS USED IN THE CONSTRUCTION OF THIS NOTE.   [place] Pager [phone] From 3PM- 11PM.   Otherwise, please contact night-coverage  [email or web address] Password [facility]  09/18/2019, 6:47 PM

## 2019-09-18 NOTE — ED Provider Notes (Signed)
[facility] Provider Note   CSN: [id number] Arrival date & time: 09/18/19  1646     History Chief Complaint  Patient presents with  . Fatigue  . Shortness of Breath    Jeffrey [name] is a 74 y.o. male with a history of diabetes, end-stage renal disease who just completed dialysis prior to arrival, alcohol induced cirrhosis with esophageal varices, history of atrial fibrillation, hypertension presenting with increased shortness of breath.  His symptoms started today, is unclear whether this was a gradual progression.  He has completed his dialysis treatment today but was told to present here secondary to a very low hemoglobin level, somewhere in the 6-7 range.  He denies any obvious blood loss, but does state his stools have been darker than normal this week, he denies diarrhea or abdominal pain, but was diagnosed with C difficile and is being tapered off vancomycin currently.  He does endorse easy bruising, denies n/v, including no hematemesis.  He also denies chest pain, cough or fever.  He does have ankle edema but states this is his baseline, it is worsened today.  He completed a full dialysis today but is unsure whether he met his dry weight at the time of his discharge.  He has been completely immunized for COVID-19.  HPI     Past Medical History:  Diagnosis Date  . A-fib [facility])    when initially stating dialysis january 2021 at [facility] per spouse; never heard anything else about it  . Alcoholic cirrhosis ([place])    patient reports completing hep a and b vaccines in 2006  . Anemia    has had 3 units of prbcs 2013  . Anxiety   . Asymptomatic gallstones    Ultrasound in 2006  . Cholelithiasis   . Chronic kidney disease   . Depression   . Diabetes ([place])   . Duodenal ulcer with hemorrhage    per patient in 2006 or 2007, records have been requested  . 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 HOH (hard of  hearing)   . Hypertension   . Splenomegaly    Ultrasound in 2006  . Tubular adenoma of colon     Patient Active Problem List   Diagnosis Date Noted  . Enteritis due to Clostridium difficile 08/31/2019  . Edema 04/11/2019  . Alcoholic cirrhosis ([place]) 16/96/7893  . Type 2 diabetes mellitus with stage 3 chronic kidney disease, with long-term current use of insulin ([place]) 10/20/2017  . Essential hypertension, benign 10/20/2017  . Morbid obesity ([place]) 10/20/2017  . Mixed hyperlipidemia 10/20/2017  . Ascites   . SBP (spontaneous bacterial peritonitis) ([place]) 01/12/2016  . Shigella dysenteriae 01/11/2016  . Diarrhea 01/10/2016  . Cirrhosis ([place]) 09/05/2012  . Alcoholic cirrhosis of liver with ascites ([place]) 03/09/2012  . Heme positive stool 03/09/2012  . Anemia due to chronic blood loss 03/09/2012  . Pancytopenia ([place]) 03/09/2012  . FH: colon cancer 03/09/2012  . Esophageal varices in alcoholic cirrhosis ([place]) 81/06/7508    Past Surgical History:  Procedure Laterality Date  . APPENDECTOMY    . AV FISTULA PLACEMENT Left 08/17/2019   Procedure: ARTERIOVENOUS (AV) FISTULA CREATION VERSES ARTERIOVENOUS GRAFT;  Surgeon: [name], MD;  Location: [facility];  Service: Vascular;  Laterality: Left;  . BIOPSY  02/03/2018   Procedure: BIOPSY;  Surgeon: Daneil Dolin, MD;  [facility];  Service: Endoscopy;;  bx of gastric polyps  . CATARACT EXTRACTION Right   . COLONOSCOPY  03/10/2005   Rectal polyp as described above, removed with snare. Left sided  diverticula. The remainder of the colonic mucosa appeared normal. Inflammed polyp on path.  . COLONOSCOPY  04/1999   Dr. Thea Silversmith polyps removed,  Path showed hyperplastic  . COLONOSCOPY  10/2015   Dr. Britta Mccreedy: diverticulosis, single sessile tubular adenoma 3-25m in size removed from descending colon.   . COLONOSCOPY WITH ESOPHAGOGASTRODUODENOSCOPY (EGD)  03/31/2012   RWPY:[id number]. Colonic diverticulosis. tubular adenoma colon    . ESOPHAGEAL BANDING N/A 02/03/2018   Procedure: ESOPHAGEAL BANDING;  Surgeon: RDaneil Dolin MD;  [facility];  Service: Endoscopy;  Laterality: N/A;  . ESOPHAGOGASTRODUODENOSCOPY  01/15/2005   Three columns grade 1 to 2 esophageal varices, otherwise normal esophageal mucosa.  Esophagus was not manipulated otherwise./Nodularity of the antrum with overlying erosions, nonspecific finding. Path showed rare H.pylori  . ESOPHAGOGASTRODUODENOSCOPY  09/2008   Dr. RGaylene Brookscolumns of grade 2-3 esoph varices, only one column was prominent. Portal gastropathy, multiple gastrc polyps at antrum, two were 2cm with black eschar, bulbar polyps, bulbar erosions  . ESOPHAGOGASTRODUODENOSCOPY  11/2004   Dr. FBrantley Stage3 esoph varices  . ESOPHAGOGASTRODUODENOSCOPY  03/31/2012   RMR: 4 columns(3-GR 2, 1-GR1) non-bleeding esophageal varices, portal gastropathy, small HH, early GAVE, multiple gastric polyps   . ESOPHAGOGASTRODUODENOSCOPY (EGD) WITH PROPOFOL N/A 02/03/2018   Dr. RGala Romney Esophageal varices, 3 columns grade 1-2.  Portal hypertensive gastropathy.  Multiple gastric polyps, biopsy consistent with hyperplastic.  .Marland KitchenGASTRIC VARICES BANDING  03/31/2012   Procedure: GASTRIC VARICES BANDING;  Surgeon: RDaneil Dolin MD;  [facility];  Service: Endoscopy;  Laterality: N/A;  . [facility]ERNIA REPAIR  2017   [facility]      Family History  Problem Relation Age of Onset  . Colon cancer Father 674      deceased age 73 . Breast cancer Sister        deceased  . Breast cancer Sister   . Stroke Mother   . Lung cancer Neg Hx   . Ovarian cancer Neg Hx     Social History   Tobacco Use  . Smoking status: Former Smoker    Packs/day: 0.50    Years: 25.00    Pack years: 12.50    Types: Cigarettes    Quit date: 01/28/1986    Years since quitting: 33.6  . Smokeless tobacco: Never Used  Substance Use Topics  . Alcohol use: No    Comment: quit in 05/2013  . Drug use: No     Home Medications Prior to Admission medications   Medication Sig Start Date End Date Taking? Authorizing Provider  Ascorbic Acid (VITAMIN C) 1000 MG tablet Take 1,000 mg by mouth 2 (two) times daily.    [provider]  cholestyramine (Lucrezia Starch 4 GM/DOSE powder Take 4 g by mouth 2 (two) times daily with a meal.  08/03/19   [provider]  Continuous Blood Gluc Sensor (FREESTYLE LIBRE 14 DAY SENSOR) MISC Inject 1 each into the skin every 14 (fourteen) days. Use as directed. 12/15/17   NCassandria Anger MD  furosemide (LASIX) 80 MG tablet Take 80 mg by mouth daily. 08/07/19   [provider]  gabapentin (NEURONTIN) 100 MG capsule Take 100 mg by mouth at bedtime.  01/07/19   [provider]  HYDROcodone-acetaminophen (NORCO) 10-325 MG tablet Take 1 tablet by mouth every 6 (  six) hours as needed for moderate pain.  04/05/19   [provider]  insulin degludec (TRESIBA FLEXTOUCH) 200 UNIT/ML FlexTouch Pen Inject 120 mLs into the skin at bedtime. 04/07/19   [provider]  insulin lispro (HUMALOG) 100 UNIT/ML KiwkPen Inject 50 Units into the skin 3 (three) times daily.     [provider]  iron polysaccharides (NIFEREX) 150 MG capsule Take 150 mg by mouth at bedtime.     [provider]  metoprolol tartrate (LOPRESSOR) 25 MG tablet Take 25 mg by mouth 2 (two) times daily. 08/07/19   [provider]  milk thistle 175 MG tablet Take 175 mg by mouth 3 (three) times daily.     [provider]  mirtazapine (REMERON) 15 MG tablet Take 15 mg by mouth at bedtime.    [provider]  Multiple Vitamin (MULTIVITAMIN WITH MINERALS) TABS tablet Take 1 tablet by mouth daily.    [provider]  omega-3 acid ethyl esters (LOVAZA) 1 g capsule Take 2 g by mouth 2 (two) times daily.  04/07/19   [provider]  omeprazole (PRILOSEC) 40 MG capsule Take 40 mg by mouth daily.    [provider]   pravastatin (PRAVACHOL) 20 MG tablet Take 20 mg by mouth daily.    [provider]  Probiotic Product (PROBIOTIC DAILY PO) Take 1 tablet by mouth daily.     [provider]  propranolol (INDERAL) 40 MG tablet TAKE 40MG AT BREAKFAST, 40MG AT LUNCH, 20 MG AT BEDTIME Patient not taking: Reported on 05/02/2019 01/13/19   Mahala Menghini, PA-C  rifaximin (XIFAXAN) 550 MG TABS tablet Take 1 tablet (550 mg total) by mouth 2 (two) times daily. 01/13/19   Mahala Menghini, PA-C  sitaGLIPtin (JANUVIA) 50 MG tablet Take 50 mg by mouth daily.     [provider]  spironolactone (ALDACTONE) 50 MG tablet Take 50 mg by mouth daily.     [provider]  thiamine 100 MG tablet Take 100 mg by mouth daily. Vitamin B-1    [provider]  tiZANidine (ZANAFLEX) 2 MG tablet Take 2 mg by mouth every 6 (six) hours as needed for muscle spasms.     [provider]  triamcinolone cream (KENALOG) 0.1 % Apply 1 application topically 2 (two) times daily as needed (dry skin Itching).  12/30/17   [provider]  vancomycin (VANCOCIN) 125 MG capsule Take 1 capsule (125 mg total) by mouth 4 (four) times daily. 05/20/19   Rogene Houston, MD  venlafaxine (EFFEXOR) 75 MG tablet Take 75 mg by mouth 3 (three) times daily with meals.     [provider]    Allergies    Patient has no known allergies.  Review of Systems   Review of Systems  Constitutional: Negative for chills and fever.  HENT: Negative for congestion and sore throat.   Eyes: Negative.   Respiratory: Positive for shortness of breath. Negative for cough and chest tightness.   Cardiovascular: Positive for leg swelling. Negative for chest pain.  Gastrointestinal: Negative for abdominal pain and nausea.  Genitourinary: Negative.   Musculoskeletal: Negative for arthralgias, joint swelling and neck pain.  Skin: Negative.  Negative for rash and wound.  Neurological: Negative for dizziness, weakness,  light-headedness, numbness and headaches.  Psychiatric/Behavioral: Negative.     Physical Exam Updated Vital Signs BP (!) 110/56   Pulse 89   Temp 97.7 F (36.5 C) (Oral)   Resp 20  SpO2 100%   Physical Exam Vitals and nursing note reviewed.  Constitutional:      Appearance: He is well-developed.  HENT:     Head: Normocephalic and atraumatic.  Eyes:     Comments: Extremely pale conjunctiva.  Cardiovascular:     Rate and Rhythm: Normal rate and regular rhythm.     Pulses: Normal pulses.     Heart sounds: Normal heart sounds.  Pulmonary:     Effort: Pulmonary effort is normal.     Breath sounds: Examination of the right-lower field reveals rales. Examination of the left-lower field reveals rales. Decreased breath sounds and rales present. No wheezing.  Chest:     Comments: Dialysis catheter right upper chest wall Abdominal:     General: Bowel sounds are normal.     Palpations: Abdomen is soft.     Tenderness: There is no abdominal tenderness.  Musculoskeletal:     Cervical back: Normal range of motion.     Right lower leg: No tenderness. 1+ Pitting Edema present.     Left lower leg: No tenderness. 1+ Pitting Edema present.  Skin:    General: Skin is warm and dry.  Neurological:     Mental Status: He is alert.     ED Results / Procedures / Treatments   Labs (all labs ordered are listed, but only abnormal results are displayed) Labs Reviewed  CBC WITH DIFFERENTIAL/PLATELET - Abnormal; Notable for the following components:      Result Value   WBC 3.4 (*)    RBC 1.73 (*)    Hemoglobin 5.4 (*)    HCT 18.7 (*)    MCV 108.1 (*)    MCHC 28.9 (*)    RDW 22.4 (*)    Platelets 124 (*)    Lymphs Abs 0.4 (*)    All other components within normal limits  COMPREHENSIVE METABOLIC PANEL - Abnormal; Notable for the following components:   Sodium 132 (*)    Chloride 96 (*)    Glucose, Bld 126 (*)    Creatinine, Ser 2.75 (*)    Calcium 8.1 (*)    Albumin 2.8 (*)    AST  42 (*)    GFR calc non Af Amer 22 (*)    GFR calc Af Amer 26 (*)    All other components within normal limits  POC OCCULT BLOOD, ED - Abnormal; Notable for the following components:   Fecal Occult Bld POSITIVE (*)    All other components within normal limits  SARS CORONAVIRUS 2 (TAT 6-24 HRS)  TYPE AND SCREEN  PREPARE RBC (CROSSMATCH)    EKG EKG Interpretation  Date/Time:  Monday September 18 2019 17:18:43 EDT Ventricular Rate:  85 PR Interval:    QRS Duration: 101 QT Interval:  384 QTC Calculation: 457 R Axis:   24 Text Interpretation: Sinus rhythm Probable inferior infarct, age indeterminate Lateral leads are also involved Since last tracing t wave abnormalities are now present Confirmed by Noemi Chapel [phone]) on 09/18/2019 6:13:32 PM   Radiology No results found.  Procedures Procedures (including critical care time)  Medications Ordered in ED Medications  0.9 %  sodium chloride infusion (has no administration in time range)  pantoprazole (PROTONIX) injection 40 mg (has no administration in time range)  sodium chloride 0.9 % bolus 250 mL (has no administration in time range)    ED Course  I have reviewed the triage vital signs and the nursing notes.  Pt hovering around 88% on room air.  Given O2, 2L, quickly improves to 100%    Pertinent labs & imaging results that were available during my care of the patient were reviewed by me and considered in my medical decision making (see chart for details).  CRITICAL CARE Performed by: Evalee Jefferson Total critical care time: 40 minutes Critical care time was exclusive of separately billable procedures and treating other patients. Critical care was necessary to treat or prevent imminent or life-threatening deterioration. Critical care was time spent personally by me on the following activities: development of treatment plan with patient and/or surrogate as well as nursing, discussions with consultants, evaluation of patient's  response to treatment, examination of patient, obtaining history from patient or surrogate, ordering and performing treatments and interventions, ordering and review of laboratory studies, ordering and review of radiographic studies, pulse oximetry and re-evaluation of patient's condition.     MDM Rules/Calculators/A&P                      Pt with acutely worsening anemia beyond baseline, today 5.6 with symptomatic weakness and sob.  Hemoccult positive without abd pain/ vomiting/ hematemesis.  Pt discussed with Dr. Laural Golden who agreed with transfusions prbc's, PPI (protonix ordered) and keep pt clear liquid diet.  He will consult pt in am .  Medical admission. Discussed with Dr. Glo Herring with the hospitalist service who accepts for admission. Requests 250 cc NS bolus given soft bp (currently 88/70,pulse 88).  also 2 units of prbc's have been ordered.  Final Clinical Impression(s) / ED Diagnoses Final diagnoses:  Fatigue, unspecified type  Gastrointestinal hemorrhage, unspecified gastrointestinal hemorrhage type  Anemia, unspecified type    Rx / DC Orders ED Discharge Orders    None       Landis Martins 09/18/19 1911    Noemi Chapel, MD 09/18/19 [phone]

## 2019-09-18 NOTE — ED Notes (Signed)
Date and time results received: 09/18/19 1814  Test: Hgb Critical Value: 5.4  Name of Provider Notified: Dr. Sabra Heck   Orders Received? Or Actions Taken?: See new orders

## 2019-09-19 ENCOUNTER — Observation Stay (HOSPITAL_COMMUNITY): Payer: Medicare Other | Admitting: Certified Registered Nurse Anesthetist

## 2019-09-19 ENCOUNTER — Encounter (HOSPITAL_COMMUNITY): Payer: Self-pay | Admitting: Internal Medicine

## 2019-09-19 ENCOUNTER — Encounter (HOSPITAL_COMMUNITY)
Admission: EM | Disposition: A | Discharge status: Home Health | Payer: Self-pay | Source: Home / Self Care | Attending: Internal Medicine

## 2019-09-19 ENCOUNTER — Other Ambulatory Visit: Payer: Self-pay

## 2019-09-19 DIAGNOSIS — K317 Polyp of stomach and duodenum: Secondary | ICD-10-CM | POA: Diagnosis not present

## 2019-09-19 DIAGNOSIS — D649 Anemia, unspecified: Secondary | ICD-10-CM

## 2019-09-19 DIAGNOSIS — K766 Portal hypertension: Secondary | ICD-10-CM | POA: Diagnosis not present

## 2019-09-19 DIAGNOSIS — K921 Melena: Secondary | ICD-10-CM | POA: Diagnosis not present

## 2019-09-19 DIAGNOSIS — N186 End stage renal disease: Secondary | ICD-10-CM | POA: Diagnosis not present

## 2019-09-19 DIAGNOSIS — I8500 Esophageal varices without bleeding: Secondary | ICD-10-CM | POA: Diagnosis not present

## 2019-09-19 DIAGNOSIS — K2970 Gastritis, unspecified, without bleeding: Secondary | ICD-10-CM | POA: Diagnosis not present

## 2019-09-19 DIAGNOSIS — K7030 Alcoholic cirrhosis of liver without ascites: Secondary | ICD-10-CM

## 2019-09-19 DIAGNOSIS — R531 Weakness: Secondary | ICD-10-CM | POA: Diagnosis not present

## 2019-09-19 DIAGNOSIS — E1122 Type 2 diabetes mellitus with diabetic chronic kidney disease: Secondary | ICD-10-CM | POA: Diagnosis not present

## 2019-09-19 HISTORY — PX: ESOPHAGOGASTRODUODENOSCOPY (EGD) WITH PROPOFOL: SHX5813

## 2019-09-19 HISTORY — PX: BIOPSY: SHX5522

## 2019-09-19 HISTORY — PX: POLYPECTOMY: SHX5525

## 2019-09-19 LAB — COMPREHENSIVE METABOLIC PANEL
ALT: 21 U/L (ref 0–44)
AST: 29 U/L (ref 15–41)
Albumin: 2.8 g/dL — ABNORMAL LOW (ref 3.5–5.0)
Alkaline Phosphatase: 93 U/L (ref 38–126)
Anion gap: 13 (ref 5–15)
BUN: 27 mg/dL — ABNORMAL HIGH (ref 8–23)
CO2: 26 mmol/L (ref 22–32)
Calcium: 8.9 mg/dL (ref 8.9–10.3)
Chloride: 97 mmol/L — ABNORMAL LOW (ref 98–111)
Creatinine, Ser: 3.38 mg/dL — ABNORMAL HIGH (ref 0.61–1.24)
GFR calc Af Amer: 20 mL/min — ABNORMAL LOW (ref >60)
GFR calc non Af Amer: 17 mL/min — ABNORMAL LOW (ref >60)
Glucose, Bld: 91 mg/dL (ref 70–99)
Potassium: 3.8 mmol/L (ref 3.5–5.1)
Sodium: 136 mmol/L (ref 135–145)
Total Bilirubin: 1 mg/dL (ref 0.3–1.2)
Total Protein: 6.9 g/dL (ref 6.5–8.1)

## 2019-09-19 LAB — CBC
HCT: 24.1 % — ABNORMAL LOW (ref 39.0–52.0)
Hemoglobin: 7 g/dL — ABNORMAL LOW (ref 13.0–17.0)
MCH: 29.7 pg (ref 26.0–34.0)
MCHC: 29 g/dL — ABNORMAL LOW (ref 30.0–36.0)
MCV: 102.1 fL — ABNORMAL HIGH (ref 80.0–100.0)
Platelets: 127 10*3/uL — ABNORMAL LOW (ref 150–400)
RBC: 2.36 MIL/uL — ABNORMAL LOW (ref 4.22–5.81)
RDW: 23.7 % — ABNORMAL HIGH (ref 11.5–15.5)
WBC: 3.9 10*3/uL — ABNORMAL LOW (ref 4.0–10.5)
nRBC: 0 % (ref 0.0–0.2)

## 2019-09-19 LAB — HEMOGLOBIN A1C
Hgb A1c MFr Bld: 5.4 % (ref 4.8–5.6)
Mean Plasma Glucose: 108.28 mg/dL

## 2019-09-19 LAB — IRON AND TIBC
Iron: 50 ug/dL (ref 45–182)
Saturation Ratios: 12 % — ABNORMAL LOW (ref 17.9–39.5)
TIBC: 418 ug/dL (ref 250–450)
UIBC: 368 ug/dL

## 2019-09-19 LAB — GLUCOSE, CAPILLARY
Glucose-Capillary: 62 mg/dL — ABNORMAL LOW (ref 70–99)
Glucose-Capillary: 87 mg/dL (ref 70–99)
Glucose-Capillary: 144 mg/dL — ABNORMAL HIGH (ref 70–99)
Glucose-Capillary: 134 mg/dL — ABNORMAL HIGH (ref 70–99)
Glucose-Capillary: 150 mg/dL — ABNORMAL HIGH (ref 70–99)

## 2019-09-19 LAB — PREPARE RBC (CROSSMATCH)

## 2019-09-19 LAB — MRSA PCR SCREENING: MRSA by PCR: NEGATIVE

## 2019-09-19 LAB — FERRITIN: Ferritin: 105 ng/mL (ref 24–336)

## 2019-09-19 LAB — PHOSPHORUS: Phosphorus: 6.1 mg/dL — ABNORMAL HIGH (ref 2.5–4.6)

## 2019-09-19 LAB — PATHOLOGIST SMEAR REVIEW

## 2019-09-19 LAB — MAGNESIUM: Magnesium: 2.1 mg/dL (ref 1.7–2.4)

## 2019-09-19 LAB — PROTIME-INR
INR: 1.2 (ref 0.8–1.2)
Prothrombin Time: 15.1 seconds (ref 11.4–15.2)

## 2019-09-19 LAB — FOLATE: Folate: 15.4 ng/mL (ref >5.9)

## 2019-09-19 SURGERY — ESOPHAGOGASTRODUODENOSCOPY (EGD) WITH PROPOFOL
Anesthesia: General

## 2019-09-19 MED ORDER — LIDOCAINE VISCOUS HCL 2 % MT SOLN
OROMUCOSAL | Status: AC
  Filled 2019-09-19: qty 15

## 2019-09-19 MED ORDER — KETAMINE HCL 10 MG/ML IJ SOLN
INTRAMUSCULAR | Status: DC | PRN
  Administered 2019-09-19: 20 mg via INTRAVENOUS

## 2019-09-19 MED ORDER — PROPOFOL 10 MG/ML IV BOLUS
INTRAVENOUS | Status: DC | PRN
  Administered 2019-09-19: 50 ug via INTRAVENOUS

## 2019-09-19 MED ORDER — LIDOCAINE VISCOUS HCL 2 % MT SOLN
OROMUCOSAL | Status: DC | PRN
  Administered 2019-09-19: 5 mL via OROMUCOSAL

## 2019-09-19 MED ORDER — PANTOPRAZOLE SODIUM 40 MG PO TBEC
40.0000 mg | DELAYED_RELEASE_TABLET | Freq: Two times a day (BID) | ORAL | Status: DC
  Administered 2019-09-19 – 2019-09-22 (×6): 40 mg via ORAL
  Filled 2019-09-19 (×5): qty 1

## 2019-09-19 MED ORDER — SODIUM CHLORIDE 0.9% IV SOLUTION: Freq: Once | INTRAVENOUS | Status: DC

## 2019-09-19 MED ORDER — KETAMINE HCL 50 MG/5ML IJ SOSY
PREFILLED_SYRINGE | INTRAMUSCULAR | Status: AC
  Filled 2019-09-19: qty 5

## 2019-09-19 MED ORDER — PROPOFOL 500 MG/50ML IV EMUL
INTRAVENOUS | Status: DC | PRN
  Administered 2019-09-19: 200 ug/kg/min via INTRAVENOUS

## 2019-09-19 MED ORDER — LACTATED RINGERS IV SOLN: INTRAVENOUS | Status: DC

## 2019-09-19 MED ORDER — LIDOCAINE HCL (CARDIAC) PF 100 MG/5ML IV SOSY
PREFILLED_SYRINGE | INTRAVENOUS | Status: DC | PRN
  Administered 2019-09-19: 50 mg via INTRATRACHEAL

## 2019-09-19 MED ORDER — DARBEPOETIN ALFA 60 MCG/0.3ML IJ SOSY
60.0000 ug | PREFILLED_SYRINGE | INTRAMUSCULAR | Status: DC
  Administered 2019-09-19: 60 ug via SUBCUTANEOUS
  Filled 2019-09-19: qty 0.3

## 2019-09-19 MED ORDER — STERILE WATER FOR IRRIGATION IR SOLN
Status: DC | PRN
  Administered 2019-09-19: 1.5 mL

## 2019-09-19 MED ORDER — LIDOCAINE VISCOUS HCL 2 % MT SOLN: 5.0000 mL | Freq: Once | OROMUCOSAL | Status: DC

## 2019-09-19 MED ORDER — SODIUM CHLORIDE 0.9 % IV SOLN: INTRAVENOUS | Status: DC

## 2019-09-19 NOTE — Consult Note (Signed)
[facility] Renal Consultation Note    Indication for Consultation:  Management of ESRD/hemodialysis; anemia, hypertension/volume and secondary hyperparathyroidism  HPI: Jeffrey [name] is a 74 y.o. male has a PMH significant for ESRD on HD MWF at [facility], anemia of CKD stage 5, alcoholic cirrhosis, DM type 2, HTN, h/o PUD and GI bleeds, atrial fibrillation, and urinary retention who was sent to [facility] ED after HD yesterday for abnormal Hgb level of 6.4 (drawn 09/15/19).  He admitted to black stools for several days and has an extensive GI history with duodenal ulcers, esophageal varices, and colonic AVM's.  He has also had worsening lower extremity edema and some SOB and weakness.  His dialysis unit reports that he has not been able to get to his dry weight and was to start 4 treatments per week due to volume overload.  We were consulted to provide HD while he remains an inpatient.   Past Medical History:  Diagnosis Date  . A-fib [facility])    when initially stating dialysis january 2021 at [facility] per spouse; never heard anything else about it  . Alcoholic cirrhosis ([place])    patient reports completing hep a and b vaccines in 2006  . Anemia    has had 3 units of prbcs 2013  . Anxiety   . Asymptomatic gallstones    Ultrasound in 2006  . Cholelithiasis   . Chronic kidney disease   . Depression   . Diabetes ([place])   . Duodenal ulcer with hemorrhage    per patient in 2006 or 2007, records have been requested  . 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 HOH (hard of hearing)   . Hypertension   . Splenomegaly    Ultrasound in 2006  . Tubular adenoma of colon    Past Surgical History:  Procedure Laterality Date  . APPENDECTOMY    . AV FISTULA PLACEMENT Left 08/17/2019   Procedure: ARTERIOVENOUS (AV) FISTULA CREATION VERSES ARTERIOVENOUS GRAFT;  Surgeon: [name], MD;  Location: [place];  Service: Vascular;  Laterality: Left;   . BIOPSY  02/03/2018   Procedure: BIOPSY;  Surgeon: Daneil Dolin, MD;  [facility];  Service: Endoscopy;;  bx of gastric polyps  . CATARACT EXTRACTION Right   . COLONOSCOPY  03/10/2005   Rectal polyp as described above, removed with snare. Left sided  diverticula. The remainder of the colonic mucosa appeared normal. Inflammed polyp on path.  . COLONOSCOPY  04/1999   Dr. Thea Silversmith polyps removed,  Path showed hyperplastic  . COLONOSCOPY  10/2015   Dr. Britta Mccreedy: diverticulosis, single sessile tubular adenoma 3-62mm in size removed from descending colon.   . COLONOSCOPY WITH ESOPHAGOGASTRODUODENOSCOPY (EGD)  03/31/2012   NKN:[id number] AVMs. Colonic diverticulosis. tubular adenoma colon  . COLONOSCOPY WITH ESOPHAGOGASTRODUODENOSCOPY (EGD)  06/2019   FORSYTH: small esophageal varices without high risk stigmata, single large AVM on lesser curvature in gastric body s/p APC ablation, gastric antral and duodenal bulb polyposis. No significant source to explain transfusion dependent anemia. Colonoscopy with portal colopathy and diffuse edema, changes of Cdiff colitis on colonoscopy  . ESOPHAGEAL BANDING N/A 02/03/2018   Procedure: ESOPHAGEAL BANDING;  Surgeon: Daneil Dolin, MD;  [facility];  Service: Endoscopy;  Laterality: N/A;  . ESOPHAGOGASTRODUODENOSCOPY  01/15/2005   Three columns grade 1 to 2 esophageal varices, otherwise normal esophageal mucosa.  Esophagus was not manipulated otherwise./Nodularity of  the antrum with overlying erosions, nonspecific finding. Path showed rare H.pylori  . ESOPHAGOGASTRODUODENOSCOPY  09/2008   Dr. Gaylene Brooks columns of grade 2-3 esoph varices, only one column was prominent. Portal gastropathy, multiple gastrc polyps at antrum, two were 2cm with black eschar, bulbar polyps, bulbar erosions  . ESOPHAGOGASTRODUODENOSCOPY  11/2004   Dr. Brantley Stage 3 esoph varices  . ESOPHAGOGASTRODUODENOSCOPY  03/31/2012   RMR: 4 columns(3-GR 2, 1-GR1)  non-bleeding esophageal varices, portal gastropathy, small HH, early GAVE, multiple gastric polyps   . ESOPHAGOGASTRODUODENOSCOPY (EGD) WITH PROPOFOL N/A 02/03/2018   Dr. Gala Romney: Esophageal varices, 3 columns grade 1-2.  Portal hypertensive gastropathy.  Multiple gastric polyps, biopsy consistent with hyperplastic.  [name] GASTRIC VARICES BANDING  03/31/2012   Procedure: GASTRIC VARICES BANDING;  Surgeon: Daneil Dolin, MD;  [facility];  Service: Endoscopy;  Laterality: N/A;  . UMBILICAL HERNIA REPAIR  2017   [facility]   Family History:   Family History  Problem Relation Age of Onset  . Colon cancer Father 60       deceased age 3  . Breast cancer Sister        deceased  . Breast cancer Sister   . Stroke Mother   . Lung cancer Neg Hx   . Ovarian cancer Neg Hx    Social History:  reports that he quit smoking about 33 years ago. His smoking use included cigarettes. He has a 12.50 pack-year smoking history. He has never used smokeless tobacco. He reports that he does not drink alcohol or use drugs. Allergies  Allergen Reactions  . Chlorhexidine   . Tape Itching and Rash   Prior to Admission medications   Medication Sig Start Date End Date Taking? Authorizing Provider  Ascorbic Acid (VITAMIN C) 1000 MG tablet Take 1,000 mg by mouth 2 (two) times daily.   Yes [provider]  cholestyramine Lucrezia Starch) 4 GM/DOSE powder Take 4 g by mouth in the morning.  08/03/19  Yes [provider]  diazepam (VALIUM) 5 MG tablet Take 5 mg by mouth daily as needed for anxiety.  09/11/19  Yes [provider]  furosemide (LASIX) 80 MG tablet Take 80 mg by mouth in the morning.  08/07/19  Yes [provider]  gabapentin (NEURONTIN) 100 MG capsule Take 100 mg by mouth at bedtime.  01/07/19  Yes [provider]  HYDROcodone-acetaminophen (NORCO) 10-325 MG tablet Take 1 tablet by mouth every 6 (six) hours as needed for moderate pain.  04/05/19  Yes [provider]  hydrOXYzine (VISTARIL) 25 MG capsule Take 25 mg by mouth 3 (three) times daily as needed for itching.  08/25/19  Yes [provider]  insulin degludec (TRESIBA FLEXTOUCH) 200 UNIT/ML FlexTouch Pen Inject 120 mLs into the skin at bedtime. *If levels are below 200-patient does not take 04/07/19  Yes [provider]  insulin lispro (HUMALOG) 100 UNIT/ML KiwkPen Inject 50 Units into the skin 3 (three) times daily.    Yes [provider]  iron polysaccharides (NIFEREX) 150 MG capsule Take 150 mg by mouth at bedtime.    Yes [provider]  metoprolol tartrate (LOPRESSOR) 25 MG tablet Take 25 mg by mouth 2 (two) times daily. 08/07/19  Yes [provider]  milk thistle 175 MG tablet Take 175 mg by mouth 3 (three) times daily.    Yes [provider]  mirtazapine (REMERON) 15 MG tablet Take 15 mg by mouth at bedtime.   Yes [provider]  Multiple  Vitamin (MULTIVITAMIN WITH MINERALS) TABS tablet Take 1 tablet by mouth in the morning.    Yes [provider]  omega-3 acid ethyl esters (LOVAZA) 1 g capsule Take 2 g by mouth 2 (two) times daily.  04/07/19  Yes [provider]  omeprazole (PRILOSEC) 40 MG capsule Take 40 mg by mouth in the morning.    Yes [provider]  pravastatin (PRAVACHOL) 20 MG tablet Take 20 mg by mouth in the morning.    Yes [provider]  Probiotic Product (PROBIOTIC DAILY PO) Take 1 tablet by mouth in the morning.    Yes [provider]  rifaximin (XIFAXAN) 550 MG TABS tablet Take 1 tablet (550 mg total) by mouth 2 (two) times daily. 01/13/19  Yes Mahala Menghini, PA-C  spironolactone (ALDACTONE) 50 MG tablet Take 50 mg by mouth in the morning.    Yes [provider]  tamsulosin (FLOMAX) 0.4 MG CAPS capsule Take 0.4 mg by mouth every evening.   Yes [provider]  thiamine 100 MG tablet Take 100 mg by mouth in the morning. Vitamin B-1   Yes [provider]  tiZANidine (ZANAFLEX) 2 MG tablet Take 2 mg by mouth every 6 (six) hours as needed for muscle spasms.    Yes [provider]  triamcinolone cream (KENALOG) 0.1 % Apply 1 application topically 2 (two) times daily as needed (dry skin Itching).  12/30/17  Yes [provider]  Vancomycin HCl 25 MG/ML SOLR Take 125 mg by mouth See admin instructions. Starting on 08/27/2019 take 34mls by mouth 4 times daily for 2 weeks, then take 17mls three times daily for 2 weeks, then take 5 mls twice daily as directed 08/27/19  Yes [provider]  venlafaxine (EFFEXOR) 75 MG tablet Take 75 mg by mouth 3 (three) times daily with meals.    Yes [provider]   Current Facility-Administered Medications  Medication Dose Route Frequency Provider Last Rate Last Admin  . 0.9 %  sodium chloride infusion  10 mL/hr Intravenous Once Howerter, Justin B, DO      . acetaminophen (TYLENOL) tablet 650 mg  650 mg Oral Q6H PRN Howerter, Justin B, DO      . cefTRIAXone (ROCEPHIN) 1 g in sodium chloride 0.9 % 100 mL IVPB  1 g Intravenous Q24H Howerter, Justin B, DO 200 mL/hr at 09/19/19 0345 1 g at 09/19/19 0345  . insulin aspart (novoLOG) injection 0-9 Units  0-9 Units Subcutaneous Q6H Howerter, Justin B, DO      . insulin glargine (LANTUS) injection 30 Units  30 Units Subcutaneous QHS Howerter, Justin B, DO   30 Units at 09/19/19 0343  . ondansetron (ZOFRAN) injection 4 mg  4 mg Intravenous Q6H PRN Howerter, Justin B, DO      . pantoprazole (PROTONIX) injection 40 mg  40 mg Intravenous Q12H Howerter, Justin B, DO   40 mg at 09/19/19 0925  . rifaximin (XIFAXAN) tablet 550 mg  550 mg Oral BID Howerter, Justin B, DO   550 mg at 09/19/19 0925  . sodium chloride flush (NS) 0.9 % injection 3 mL  3 mL Intravenous Q12H Howerter, Justin B, DO   10 mL at 09/19/19 0925   Labs: Basic Metabolic Panel: Recent Labs  Lab 09/18/19 1715 09/19/19 0828  NA 132* 136  K 3.8 3.8  CL 96* 97*  CO2 23 26   GLUCOSE 126* 91  BUN 22 27*  CREATININE 2.75* 3.38*  CALCIUM 8.1*  8.9  PHOS  --  6.1*   Liver Function Tests: Recent Labs  Lab 09/18/19 1715 09/19/19 0828  AST 42* 29  ALT 19 21  ALKPHOS 98 93  BILITOT 0.9 1.0  PROT 7.0 6.9  ALBUMIN 2.8* 2.8*   No results for input(s): LIPASE, AMYLASE in the last 168 hours. No results for input(s): AMMONIA in the last 168 hours. CBC: Recent Labs  Lab 09/18/19 1715 09/19/19 0828  WBC 3.4* 3.9*  NEUTROABS 2.5  --   HGB 5.4* 7.0*  HCT 18.7* 24.1*  MCV 108.1* 102.1*  PLT 124* 127*   Cardiac Enzymes: No results for input(s): CKTOTAL, CKMB, CKMBINDEX, TROPONINI in the last 168 hours. CBG: Recent Labs  Lab 09/19/19 0536 09/19/19 0634  GLUCAP 62* 87   Iron Studies: No results for input(s): IRON, TIBC, TRANSFERRIN, FERRITIN in the last 72 hours. Studies/Results: DG Chest Port 1 View  Result Date: 09/18/2019 CLINICAL DATA:  Shortness of breath EXAM: PORTABLE CHEST 1 VIEW COMPARISON:  05/31/2019 FINDINGS: Right dialysis catheter in place with the tip in the upper right atrium. No pneumothorax. Cardiomegaly with vascular congestion. No confluent opacities or effusions. No acute bony abnormality. IMPRESSION: Right dialysis catheter tip in the upper right atrium. Cardiomegaly, vascular congestion. Electronically Signed   By: Rolm Baptise M.D.   On: 09/18/2019 19:03    ROS: Pertinent items are noted in HPI. Physical Exam: Vitals:   09/19/19 0500 09/19/19 0600 09/19/19 0700 09/19/19 0744  BP: (!) 128/59 (!) 139/41 (!) 129/49   Pulse: 86 92 88 93  Resp: 17 16 16 16   Temp:    98 F (36.7 C)  TempSrc:    Oral  SpO2: 96% 99% 95% 91%  Weight: 117 kg     Height:          Weight change:   Intake/Output Summary (Last 24 hours) at 09/19/2019 0941 Last data filed at 09/19/2019 0300 Gross per 24 hour  Intake 368 ml  Output --  Net 368 ml   BP (!) 129/49   Pulse 93   Temp 98 F (36.7 C) (Oral)   Resp 16   Ht 5\' 7"  (1.702 m)   Wt 117  kg   SpO2 91%   BMI 40.40 kg/m  General appearance: alert, cooperative and no distress Head: Normocephalic, without obvious abnormality, atraumatic Resp: clear to auscultation bilaterally Cardio: regular rate and rhythm, S1, S2 normal, no murmur, click, rub or gallop GI: soft, non-tender; bowel sounds normal; no masses,  no organomegaly Extremities: edema 1+ lower extremity edema to knees and LUE first stage BVT +T/B Dialysis Access:  Dialysis Orders: Center: [facility]  on MWF . EDW 106kg HD Bath 2K/2.5Ca  Time 4 hrs Heparin none. Access RIJ TDC BFR 350 DFR 600    Epogen 10,000   Units IV/HD    Assessment/Plan: 1.  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  GI consulted.  Pt has received 2 units PRBC's.  Continue to follow H/H and transfuse as needed.  Will continue with ESA. 2.  ESRD -  Plan for HD tomorrow. 3.  Hypertension/volume  - 11 kg above edw but has not reached his outpatient HD in some time.  Plan for HD tomorrow and UF as tolerated.   4.  Vascular access- using RIJ TDC but had first stage LUE BVT created on 08/17/19 by Dr. Donnetta Hutching and will require second stage 5.  Metabolic bone disease -   Continue with  home meds and renal diet 6.  Nutrition -  Renal diet, carb modified but will likely be npo for GI workup 7. DM- per primary svc  Donetta Potts, MD [place] Pager [phone] 09/19/2019, 9:41 AM

## 2019-09-19 NOTE — Interval H&P Note (Signed)
History and Physical Interval Note:  09/19/2019 2:01 PM  Jeffrey [name]  has presented today for surgery, with the diagnosis of transfusion dependent anemia, melena.  The various methods of treatment have been discussed with the patient and family. After consideration of risks, benefits and other options for treatment, the patient has consented to  Procedure(s): ESOPHAGOGASTRODUODENOSCOPY (EGD) WITH PROPOFOL (N/A) as a surgical intervention.  The patient's history has been reviewed, patient examined, no change in status, stable for surgery.  I have reviewed the patient's chart and labs.  Questions were answered to the patient's satisfaction.     [employer]

## 2019-09-19 NOTE — Anesthesia Postprocedure Evaluation (Signed)
Anesthesia Post Note  Patient: Jeffrey [name]  Procedure(s) Performed: ESOPHAGOGASTRODUODENOSCOPY (EGD) WITH PROPOFOL (N/A ) POLYPECTOMY BIOPSY  Patient location during evaluation: Endoscopy Anesthesia Type: General Level of consciousness: awake and alert Pain management: pain level controlled Vital Signs Assessment: post-procedure vital signs reviewed and stable Respiratory status: spontaneous breathing, nonlabored ventilation, respiratory function stable and patient connected to nasal cannula oxygen Cardiovascular status: blood pressure returned to baseline and stable Postop Assessment: no apparent nausea or vomiting Anesthetic complications: no     Last Vitals:  Vitals:   09/19/19 1348 09/19/19 1445  BP: 129/60   Pulse: 97 95  Resp: 16 (!) 24  Temp: 37 C 37.1 C  SpO2: 98% 98%    Last Pain:  Vitals:   09/19/19 1445  TempSrc:   PainSc: 0-No pain                 Talitha Givens

## 2019-09-19 NOTE — Anesthesia Preprocedure Evaluation (Signed)
Anesthesia Evaluation  Patient identified by MRN, date of birth, ID band Patient awake    Reviewed: Allergy & Precautions, H&P , NPO status , Patient's Chart, lab work & pertinent test results, reviewed documented beta blocker date and time   Airway Mallampati: II  TM Distance: >3 FB Neck ROM: full    Dental no notable dental hx. (+) Edentulous Lower, Edentulous Upper   Pulmonary neg pulmonary ROS, former smoker,    Pulmonary exam normal breath sounds clear to auscultation       Cardiovascular Exercise Tolerance: Good hypertension, negative cardio ROS   Rhythm:regular Rate:Normal     Neuro/Psych PSYCHIATRIC DISORDERS Anxiety Depression negative neurological ROS     GI/Hepatic Neg liver ROS, PUD,   Endo/Other  negative endocrine ROSdiabetes, Type 2  Renal/GU ESRF and DialysisRenal disease  negative genitourinary   Musculoskeletal   Abdominal   Peds  Hematology  (+) Blood dyscrasia, anemia ,   Anesthesia Other Findings   Reproductive/Obstetrics negative OB ROS                             Anesthesia Physical Anesthesia Plan  ASA: III  Anesthesia Plan: General   Post-op Pain Management:    Induction:   PONV Risk Score and Plan: 1 and Propofol infusion  Airway Management Planned:   Additional Equipment:   Intra-op Plan:   Post-operative Plan:   Informed Consent: I have reviewed the patients History and Physical, chart, labs and discussed the procedure including the risks, benefits and alternatives for the proposed anesthesia with the patient or authorized representative who has indicated his/her understanding and acceptance.     Dental Advisory Given  Plan Discussed with: CRNA  Anesthesia Plan Comments:         Anesthesia Quick Evaluation

## 2019-09-19 NOTE — Care Management Obs Status (Signed)
[place] NOTIFICATION   Patient Details  Name: Jeffrey [name] MRN: [id number] Date of Birth: April 01, 1946   Medicare Observation Status Notification Given:  Yes    [name] 09/19/2019, 2:25 PM

## 2019-09-19 NOTE — Progress Notes (Signed)
Plans for EGD with Propofol today. Patient is now NPO. Additional 1 unit PRBCs ordered stat. I called and personally spoke with ICU regarding blood transfusion and NPO status.

## 2019-09-19 NOTE — Op Note (Addendum)
[facility] Patient Name: Jeffrey [name] Procedure Date: 09/19/2019 1:23 PM MRN: [id number] Date of Birth: Dec 16, 1945 Attending MD: Barney Drain MD, MD CSN: [id number] Age: 74 Admit Type: Inpatient Procedure:                Upper GI endoscopy WITH COLD FORCEPS BIOPSY/SNARE                            CAUTERY POLYPECTOMY Indications:              Melena, PMHx: ETOHic CIRRHOSIS, CHILD PUGH A. Providers:                Barney Drain MD, MD, Charlsie Quest. Theda Sers RN, RN,                            Aram Candela Referring MD:             Fuller Canada Manuella Ghazi MD, MD Medicines:                Propofol per Anesthesia Complications:            No immediate complications. Estimated Blood Loss:     Estimated blood loss was minimal. Procedure:                Pre-Anesthesia Assessment:                           - Prior to the procedure, a History and Physical                            was performed, and patient medications and                            allergies were reviewed. The patient's tolerance of                            previous anesthesia was also reviewed. The risks                            and benefits of the procedure and the sedation                            options and risks were discussed with the patient.                            All questions were answered, and informed consent                            was obtained. Prior Anticoagulants: The patient has                            taken no previous anticoagulant or antiplatelet                            agents. ASA Grade Assessment: III - A patient with  severe systemic disease. After reviewing the risks                            and benefits, the patient was deemed in                            satisfactory condition to undergo the procedure.                            After obtaining informed consent, the endoscope was                            passed under direct vision. Throughout the               procedure, the patient's blood pressure, pulse, and                            oxygen saturations were monitored continuously. The                            [id number]-[id number] ([id number]) scope was introduced through the                            mouth, and advanced to the second part of duodenum.                            The upper GI endoscopy was accomplished without                            difficulty. The patient tolerated the procedure                            well. Scope In: 2:22:37 PM Scope Out: 2:36:17 PM Total Procedure Duration: 0 hours 13 minutes 40 seconds  Findings:      Five columns of grade I, grade II varices with no stigmata of recent       bleeding were found in the lower third of the esophagus,. They were       small in size. No red wale signs were present.      Mild portal hypertensive gastropathy was found in the gastric fundus and       in the gastric body.      Localized moderate inflammation characterized by congestion (edema),       erosions and erythema was found on the greater curvature of the stomach       and in the gastric antrum. Biopsies(2:BODY,1:INCISURA,2:ANTRUM) were       taken with a cold forceps for Helicobacter pylori testing.      A single 12 to 13 mm sessile polyp with no bleeding and no stigmata of       recent bleeding was found in the prepyloric region of the stomach. The       polyp was removed with a hot snare. Resection and retrieval were       complete via [place].      Diffuse friable mucosa found in the duodenal bulb.      The second portion  of the duodenum was normal. Impression:               - Grade I and grade II esophageal varices with no                            stigmata of recent bleeding.                           - MILD Portal hypertensive gastropathy.                           - MODERATE Gastritis. Biopsied.                           - A single gastric polyp. Resected and retrieved.                           - NO  OBVOUS SOURCE FOR MELENA/TRANSFUSION DEPENDENT                            ANEMIA IDENTIFIED. MAY BE DUE TO FRIABLE GASTRIC                            AND DUODENAL MUCOSA IN SETTING OF PORTAL                            HYPERTENSION Moderate Sedation:      Per Anesthesia Care Recommendation:           - Return patient to hospital ward for ongoing care.                           - Full liquid diet.                           - Continue present medications.                           - Await pathology results. NEEDS AGILE STUDY PRIOR                            TO GIVENS CAPSULE STUDY.                           - Return to GI clinic in 3 months. Procedure Code(s):        --- Professional ---                           [phone], Esophagogastroduodenoscopy, flexible,                            transoral; with removal of tumor(s), polyp(s), or                            other lesion(s) by snare technique  24825, 69, Esophagogastroduodenoscopy, flexible,                            transoral; with biopsy, single or multiple Diagnosis Code(s):        --- Professional ---                           I85.00, Esophageal varices without bleeding                           K76.6, Portal hypertension                           K31.89, Other diseases of stomach and duodenum                           K29.70, Gastritis, unspecified, without bleeding                           K31.7, Polyp of stomach and duodenum                           K92.1, Melena (includes Hematochezia) CPT copyright 2019 American Medical Association. All rights reserved. The codes documented in this report are preliminary and upon coder review may  be revised to meet current compliance requirements. Barney Drain, MD Barney Drain MD, MD 09/19/2019 3:02:54 PM This report has been signed electronically. Number of Addenda: 0

## 2019-09-19 NOTE — Progress Notes (Signed)
Reviewed vanc taper from discharge summary 2/11. Patient has not been taking appropriately for at least the past month. Should have completed Vanc taper around late March. Hold off on further vanc at this time.

## 2019-09-19 NOTE — Transfer of Care (Signed)
Immediate Anesthesia Transfer of Care Note  Patient: Jeffrey [name]  Procedure(s) Performed: ESOPHAGOGASTRODUODENOSCOPY (EGD) WITH PROPOFOL (N/A ) POLYPECTOMY BIOPSY  Patient Location: PACU  Anesthesia Type:General  Level of Consciousness: awake, alert  and oriented  Airway & Oxygen Therapy: Patient Spontanous Breathing and Patient connected to nasal cannula oxygen  Post-op Assessment: Report given to RN, Post -op Vital signs reviewed and stable and Patient moving all extremities X 4  Post vital signs: Reviewed and stable  Last Vitals:  Vitals Value Taken Time  BP 154/66 09/19/19 1446  Temp 37.1 C 09/19/19 1445  Pulse 95 09/19/19 1450  Resp 20 09/19/19 1450  SpO2 98 % 09/19/19 1450  Vitals shown include unvalidated device data.  Last Pain:  Vitals:   09/19/19 1445  TempSrc:   PainSc: 0-No pain         Complications: No apparent anesthesia complications

## 2019-09-19 NOTE — H&P (View-Only) (Signed)
Referring Provider: Evalee Jefferson, PA-C/[place] [facility] Primary Care Physician:  Monico Blitz, MD Primary Gastroenterologist:  Dr. Gala Romney   Date of Admission: 09/18/19 Date of Consultation: 09/19/19  Reason for Consultation:  Profound anemia  HPI:  Jeffrey [name] is a 74 y.o. 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  Hgb 5.4. Previously 7 range for past few months. Heme positive stool. Baseline averages around 7-8/9. Macrocytic indices. He has received 2 unit PRBCs thus far. Repeat H/H with Hgb 7.0/Hct 24.1.   Past history complicated and includes following:  Admitted Jan 2021 at [facility] after transferred from [facility] for prolonged course through Jul 13, 2019 due to GI bleed and received total of 7 units PRBCs, EGD completed while inpatient Jan 2021 with small esophageal varices without high risk stigmata, single large AVM on lesser curvature in gastric body s/p APC ablation, gastric antral and duodenal bulb polyposis. No significant source to explain transfusion dependent anemia. Colonoscopy with portal colopathy and diffuse edema, changes of Cdiff colitis on colonoscopy. Felt to have developing hepatorenal syndrome and had permcath placed, starting dialysis while inpatient. Appears at discharge, Hgb was 7.7. Several paras completed while inpatient.   Also notable for history of Cdiff colitis, initially diagnosed in Dec 2021, refractory to vanc with initial course,  completed 20 day course of Dificid. Repeat Cdiff positive 2/3 while inpatient at [facility], starting oral vanc and recommending 6 week taper of vanc with Questran for 6 weeks.   He reports intermittent black stool over the past few weeks. Last black stool yesterday morning. No melena since admission. Heme positive yesterday in ED. Feels  improved from admission yesterday. Very pleasant but quite talkative. Good historian. Denying any NSAIDs. No anticoagulation. He is on iron as outpatient but had not noticed black stool after discharge from [place] until just recently over past few weeks. No abdominal pain, no N/V. Stools have improved in consistency, have 1-2 soft stools a day and sometimes skipping a few days. Nearing the end of his vanc taper. No hematochezia. No confusion. He is well versed on signs of asterixis and states "see, I have just a small amount, much better than before".   Dialysis will be planned for tomorrow.   RUQ [place] completed April 2021 as outpatient, no mass. Cirrhotic appearing liver. Minimal ascites, insufficient for para.   Past Medical History:  Diagnosis Date  . A-fib [facility])    when initially stating dialysis january 2021 at [facility] per spouse; never heard anything else about it  . Alcoholic cirrhosis ([place])    patient reports completing hep a and b vaccines in 2006  . Anemia    has had 3 units of prbcs 2013  . Anxiety   . Asymptomatic gallstones    Ultrasound in 2006  . Cholelithiasis   . Chronic kidney disease   . Depression   . Diabetes ([place])   . Duodenal ulcer with hemorrhage    per patient in 2006 or 2007, records have been requested  . 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 HOH (hard of hearing)   . Hypertension   . Splenomegaly    Ultrasound in 2006  . Tubular adenoma of colon     Past Surgical History:  Procedure Laterality  Date  . APPENDECTOMY    . AV FISTULA PLACEMENT Left 08/17/2019   Procedure: ARTERIOVENOUS (AV) FISTULA CREATION VERSES ARTERIOVENOUS GRAFT;  Surgeon: [name], MD;  Location: [place];  Service: Vascular;  Laterality: Left;  . BIOPSY  02/03/2018   Procedure: BIOPSY;  Surgeon: Daneil Dolin, MD;  [facility];  Service: Endoscopy;;  bx of gastric polyps  . CATARACT EXTRACTION Right   .  COLONOSCOPY  03/10/2005   Rectal polyp as described above, removed with snare. Left sided  diverticula. The remainder of the colonic mucosa appeared normal. Inflammed polyp on path.  . COLONOSCOPY  04/1999   Dr. Thea Silversmith polyps removed,  Path showed hyperplastic  . COLONOSCOPY  10/2015   Dr. Britta Mccreedy: diverticulosis, single sessile tubular adenoma 3-54mm in size removed from descending colon.   . COLONOSCOPY WITH ESOPHAGOGASTRODUODENOSCOPY (EGD)  03/31/2012   UVO:[id number] AVMs. Colonic diverticulosis. tubular adenoma colon  . COLONOSCOPY WITH ESOPHAGOGASTRODUODENOSCOPY (EGD)  06/2019   FORSYTH: small esophageal varices without high risk stigmata, single large AVM on lesser curvature in gastric body s/p APC ablation, gastric antral and duodenal bulb polyposis. No significant source to explain transfusion dependent anemia. Colonoscopy with portal colopathy and diffuse edema, changes of Cdiff colitis on colonoscopy  . ESOPHAGEAL BANDING N/A 02/03/2018   Procedure: ESOPHAGEAL BANDING;  Surgeon: Daneil Dolin, MD;  [facility];  Service: Endoscopy;  Laterality: N/A;  . ESOPHAGOGASTRODUODENOSCOPY  01/15/2005   Three columns grade 1 to 2 esophageal varices, otherwise normal esophageal mucosa.  Esophagus was not manipulated otherwise./Nodularity of the antrum with overlying erosions, nonspecific finding. Path showed rare H.pylori  . ESOPHAGOGASTRODUODENOSCOPY  09/2008   Dr. Gaylene Brooks columns of grade 2-3 esoph varices, only one column was prominent. Portal gastropathy, multiple gastrc polyps at antrum, two were 2cm with black eschar, bulbar polyps, bulbar erosions  . ESOPHAGOGASTRODUODENOSCOPY  11/2004   Dr. Brantley Stage 3 esoph varices  . ESOPHAGOGASTRODUODENOSCOPY  03/31/2012   RMR: 4 columns(3-GR 2, 1-GR1) non-bleeding esophageal varices, portal gastropathy, small HH, early GAVE, multiple gastric polyps   . ESOPHAGOGASTRODUODENOSCOPY (EGD) WITH PROPOFOL N/A 02/03/2018   Dr. Gala Romney:  Esophageal varices, 3 columns grade 1-2.  Portal hypertensive gastropathy.  Multiple gastric polyps, biopsy consistent with hyperplastic.  [name] GASTRIC VARICES BANDING  03/31/2012   Procedure: GASTRIC VARICES BANDING;  Surgeon: Daneil Dolin, MD;  [facility];  Service: Endoscopy;  Laterality: N/A;  . UMBILICAL HERNIA REPAIR  2017   [facility]    Prior to Admission medications   Medication Sig Start Date End Date Taking? Authorizing Provider  Ascorbic Acid (VITAMIN C) 1000 MG tablet Take 1,000 mg by mouth 2 (two) times daily.   Yes [provider]  cholestyramine Lucrezia Starch) 4 GM/DOSE powder Take 4 g by mouth in the morning.  08/03/19  Yes [provider]  diazepam (VALIUM) 5 MG tablet Take 5 mg by mouth daily as needed for anxiety.  09/11/19  Yes [provider]  furosemide (LASIX) 80 MG tablet Take 80 mg by mouth in the morning.  08/07/19  Yes [provider]  gabapentin (NEURONTIN) 100 MG capsule Take 100 mg by mouth at bedtime.  01/07/19  Yes [provider]  HYDROcodone-acetaminophen (NORCO) 10-325 MG tablet Take 1 tablet by mouth every 6 (six) hours as needed for moderate pain.  04/05/19  Yes [provider]  hydrOXYzine (VISTARIL) 25 MG capsule Take 25 mg by mouth 3 (three) times daily as needed for itching.  08/25/19  Yes [provider]  insulin degludec (TRESIBA FLEXTOUCH) 200 UNIT/ML FlexTouch Pen Inject 120 mLs into the skin at bedtime. *If levels are below 200-patient does not take 04/07/19  Yes [provider]  insulin lispro (HUMALOG) 100 UNIT/ML KiwkPen Inject 50 Units into the skin 3 (three) times daily.    Yes [provider]  iron polysaccharides (NIFEREX) 150 MG capsule Take 150 mg by mouth at bedtime.    Yes [provider]  metoprolol tartrate (LOPRESSOR) 25 MG tablet Take 25 mg by mouth 2 (two) times daily. 08/07/19  Yes [provider]  milk thistle 175 MG tablet Take 175 mg by  mouth 3 (three) times daily.    Yes [provider]  mirtazapine (REMERON) 15 MG tablet Take 15 mg by mouth at bedtime.   Yes [provider]  Multiple Vitamin (MULTIVITAMIN WITH MINERALS) TABS tablet Take 1 tablet by mouth in the morning.    Yes [provider]  omega-3 acid ethyl esters (LOVAZA) 1 g capsule Take 2 g by mouth 2 (two) times daily.  04/07/19  Yes [provider]  omeprazole (PRILOSEC) 40 MG capsule Take 40 mg by mouth in the morning.    Yes [provider]  pravastatin (PRAVACHOL) 20 MG tablet Take 20 mg by mouth in the morning.    Yes [provider]  Probiotic Product (PROBIOTIC DAILY PO) Take 1 tablet by mouth in the morning.    Yes [provider]  rifaximin (XIFAXAN) 550 MG TABS tablet Take 1 tablet (550 mg total) by mouth 2 (two) times daily. 01/13/19  Yes Mahala Menghini, PA-C  spironolactone (ALDACTONE) 50 MG tablet Take 50 mg by mouth in the morning.    Yes [provider]  tamsulosin (FLOMAX) 0.4 MG CAPS capsule Take 0.4 mg by mouth every evening.   Yes [provider]  thiamine 100 MG tablet Take 100 mg by mouth in the morning. Vitamin B-1   Yes [provider]  tiZANidine (ZANAFLEX) 2 MG tablet Take 2 mg by mouth every 6 (six) hours as needed for muscle spasms.    Yes [provider]  triamcinolone cream (KENALOG) 0.1 % Apply 1 application topically 2 (two) times daily as needed (dry skin Itching).  12/30/17  Yes [provider]  Vancomycin HCl 25 MG/ML SOLR Take 125 mg by mouth See admin instructions. Starting on 08/27/2019 take 73mls by mouth 4 times daily for 2 weeks, then take 44mls three times daily for 2 weeks, then take 5 mls twice daily as directed 08/27/19  Yes [provider]  venlafaxine (EFFEXOR) 75 MG tablet Take 75 mg by mouth 3 (three) times daily with meals.    Yes [provider]    Current Facility-Administered Medications  Medication  Dose Route Frequency Provider Last Rate Last Admin  . 0.9 %  sodium chloride infusion  10 mL/hr Intravenous Once Howerter, Justin B, DO      . acetaminophen (TYLENOL) tablet 650 mg  650 mg Oral Q6H PRN Howerter, Justin B, DO      . cefTRIAXone (ROCEPHIN) 1 g in sodium chloride 0.9 % 100 mL IVPB  1 g Intravenous Q24H Howerter, Justin B, DO 200 mL/hr at 09/19/19 0345 1 g at 09/19/19 0345  . insulin aspart (novoLOG) injection 0-9 Units  0-9 Units Subcutaneous Q6H Howerter, Justin B, DO      . insulin glargine (LANTUS) injection 30 Units  30 Units Subcutaneous QHS Howerter, Justin B, DO  30 Units at 09/19/19 0343  . ondansetron (ZOFRAN) injection 4 mg  4 mg Intravenous Q6H PRN Howerter, Justin B, DO      . pantoprazole (PROTONIX) injection 40 mg  40 mg Intravenous Q12H Howerter, Justin B, DO   40 mg at 09/19/19 0925  . rifaximin (XIFAXAN) tablet 550 mg  550 mg Oral BID Howerter, Justin B, DO   550 mg at 09/19/19 0925  . sodium chloride flush (NS) 0.9 % injection 3 mL  3 mL Intravenous Q12H Howerter, Justin B, DO   10 mL at 09/19/19 0925    Allergies as of 09/18/2019 - Review Complete 09/18/2019  Allergen Reaction Noted  . Tape Itching and Rash 09/18/2019    Family History  Problem Relation Age of Onset  . Colon cancer Father 16       deceased age 43  . Breast cancer Sister        deceased  . Breast cancer Sister   . Stroke Mother   . Lung cancer Neg Hx   . Ovarian cancer Neg Hx     Social History   Socioeconomic History  . Marital status: Married    Spouse name: Not on file  . Number of children: 2  . Years of education: Not on file  . Highest education level: Not on file  Occupational History  . Occupation: Disabled    Employer: SELF EMPLOYED  Tobacco Use  . Smoking status: Former Smoker    Packs/day: 0.50    Years: 25.00    Pack years: 12.50    Types: Cigarettes    Quit date: 01/28/1986    Years since quitting: 33.6  . Smokeless tobacco: Never Used  Substance and Sexual  Activity  . Alcohol use: No    Comment: quit in 05/2013  . Drug use: No  . Sexual activity: Not Currently  Other Topics Concern  . Not on file  Social History Narrative   Two step children from second marriage (divorced) who live with him along with some grandchildren.    Social Determinants of Health   Financial Resource Strain:   . Difficulty of Paying Living Expenses:   Food Insecurity:   . Worried About Charity fundraiser in the Last Year:   . Arboriculturist in the Last Year:   Transportation Needs:   . Film/video editor (Medical):   [name] Lack of Transportation (Non-Medical):   Physical Activity:   . Days of Exercise per Week:   . Minutes of Exercise per Session:   Stress:   . Feeling of Stress :   Social Connections:   . Frequency of Communication with Friends and Family:   . Frequency of Social Gatherings with Friends and Family:   . Attends Religious Services:   . Active Member of Clubs or Organizations:   . Attends Archivist Meetings:   [name] Marital Status:   Intimate Partner Violence:   . Fear of Current or Ex-Partner:   . Emotionally Abused:   [name] Physically Abused:   . Sexually Abused:     Review of Systems: Gen: see HPI CV: Denies chest pain, heart palpitations, syncope, edema  Resp: +DOE GI: see HPI GU : Denies urinary burning, urinary frequency, urinary incontinence.  MS: +right knee pain Derm: Denies rash, itching, dry skin Psych: Denies depression, anxiety,confusion, or memory loss Heme: see HPI  Physical Exam: Vital signs in last 24 hours: Temp:  [97.7 F (36.5 C)-99.3 F (37.4 C)] 98  F (36.7 C) (04/20 0744) Pulse Rate:  [83-93] 93 (04/20 0744) Resp:  [15-21] 16 (04/20 0744) BP: (88-148)/(41-79) 129/49 (04/20 0700) SpO2:  [74 %-100 %] 91 % (04/20 0744) Weight:  [622 kg] 117 kg (04/20 0500)   General:   Alert,  Well-developed, well-nourished, pleasant and cooperative in NAD, appears younger than stated age Head:  Normocephalic  and atraumatic. Eyes:  Sclera clear, no icterus.   Ears:  Normal auditory acuity. Nose:  No deformity, discharge,  or lesions. Mouth:  No deformity or lesions, dentition normal. Lungs:  Clear throughout to auscultation.    Heart:  S1 S1 present with systolic murmur Abdomen:  Obese, +BS, distended/round but soft, midline scar with questionable recurrent hernia, small amount anasarca bilateral flanks Rectal:  Deferred  Msk:  Symmetrical without gross deformities. Normal posture. Extremities:  With pedal edema, query chronic venous stasis changes bilateral lower extremities, 2+ pitting edema above knee, right knee with edema > left Neurologic:  Alert and  oriented x4; very minimal asterixis  Skin:  Intact without significant lesions or rashes. Psych:  Alert and cooperative. Normal mood and affect.  Intake/Output from previous day: 04/19 0701 - 04/20 0700 In: 368 [Blood:368] Out: -  Intake/Output this shift: No intake/output data recorded.  Lab Results: Recent Labs    09/18/19 1715 09/19/19 0828  WBC 3.4* 3.9*  HGB 5.4* 7.0*  HCT 18.7* 24.1*  PLT 124* 127*   BMET Recent Labs    09/18/19 1715 09/19/19 0828  NA 132* 136  K 3.8 3.8  CL 96* 97*  CO2 23 26  GLUCOSE 126* 91  BUN 22 27*  CREATININE 2.75* 3.38*  CALCIUM 8.1* 8.9   LFT Recent Labs    09/18/19 1715 09/19/19 0828  PROT 7.0 6.9  ALBUMIN 2.8* 2.8*  AST 42* 29  ALT 19 21  ALKPHOS 98 93  BILITOT 0.9 1.0   PT/INR Recent Labs    09/18/19 1715 09/19/19 0828  LABPROT 15.0 15.1  INR 1.2 1.2    Studies/Results: DG Chest Port 1 View  Result Date: 09/18/2019 CLINICAL DATA:  Shortness of breath EXAM: PORTABLE CHEST 1 VIEW COMPARISON:  05/31/2019 FINDINGS: Right dialysis catheter in place with the tip in the upper right atrium. No pneumothorax. Cardiomegaly with vascular congestion. No confluent opacities or effusions. No acute bony abnormality. IMPRESSION: Right dialysis catheter tip in the upper right  atrium. Cardiomegaly, vascular congestion. Electronically Signed   By: Rolm Baptise M.D.   On: 09/18/2019 19:03    Impression: Pleasant 75 year old male with history of ETOH cirrhosis (MELD Na 23 on dialysis, Child Pugh B), history of transfusion dependent anemia, encephalopathy, refractory Cdiff that has improved with prolonged vanc taper, presenting with acute on chronic anemia, heme positive stool, and reports of melena. Admitting Hgb 5.4, improved to 7.0 s/p 2 units PRBCs. Recent Hgbs have been in the 7/8 range.   Doubt dealing with variceal bleed; it is notable he was inpatient at [facility] receiving 7 units PRBCs earlier this year (Jan/Feb 2021), s/p EGD and colonoscopy with small esophageal varices without high risk stigmata, single large AVM on lesser curvature in gastric body s/p APC ablation, gastric antral and duodenal bulb polyposis. Colonoscopy with portal colopathy and diffuse edema, changes of Cdiff colitis on colonoscopy. From what I can see, he has not had complete imaging of small bowel via capsule study. Suspect may have small bowel source as etiology, less likely variceal bleed. Denying any NSAIDs, no anticoagulation. Would recommend holding heparin during  dialysis if at all possible. He has had clear liquids this morning; continue with supportive measures, transfuse as needed, and will need likely EGD with capsule. Empiric antibiotics for SBP prophylaxis have been started already. Hold off on octreotide unless active, overt GI bleeding.  History of refractory Cdiff: initial diagnosis in Dec 2021 without improvement on Vanc, completing course of Dificid X 20 days, repeat Cdiff positive while at [facility] on 2/3, discharged on oral vancomycin with 6 week taper. Nearing the end of this taper and has had good response with return to normal bowel movements. If recurrence, recommend evaluation for FMT.   ESRD: on dialysis. Plans for tomorrow.   Plan:  Transfuse as needed, follow H/H  PPI  BID  Continue Xifaxan BID, risks outweigh benefits. Currently not on lactulose but would avoid in setting of recent Cdiff and clouding clinical picture  Will need to determine status of vanc taper: needs to continue while inpatient to complete whole course. Nearing the end per patient.   Clear liquids for now: he has already eaten breakfast.   Would benefit from repeat evaluation of UGI tract and small bowel in setting of transfusion dependent anemia; discussing with Dr. Oneida Alar. Colonoscopy on file earlier this year.  Judicious use of antibiotics with history of refractory Cdiff  If possible, avoid heparin during dialysis   Annitta Needs, PhD, ANP-BC [facility]       LOS: 0 days    09/19/2019, 9:34 AM  Addendum at 1030: further review of chart, exploratory laparotomy in 2017, washout, ventral hernia repair. Likely will need agile prior to capsule. NPO now with plans for EGD with Propofol today.

## 2019-09-19 NOTE — Anesthesia Procedure Notes (Signed)
Date/Time: 09/19/2019 2:05 PM Performed by: Lyda Jester, CRNA Pre-anesthesia Checklist: Patient being monitored, Suction available, Emergency Drugs available, Patient identified and Timeout performed Patient Re-evaluated:Patient Re-evaluated prior to induction Oxygen Delivery Method: Nasal cannula

## 2019-09-19 NOTE — Consult Note (Addendum)
Referring Provider: Evalee Jefferson, PA-C/ ED Primary Care Physician:  Monico Blitz, MD Primary Gastroenterologist:  Dr. Gala Romney   Date of Admission: 09/18/19 Date of Consultation: 09/19/19  Reason for Consultation:  Profound anemia  HPI:  Jeffrey [name] is a 74 y.o. 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  Hgb 5.4. Previously 7 range for past few months. Heme positive stool. Baseline averages around 7-8/9. Macrocytic indices. He has received 2 unit PRBCs thus far. Repeat H/H with Hgb 7.0/Hct 24.1.   Past history complicated and includes following:  Admitted Jan 2021 at [facility] after transferred from [facility] for prolonged course through Jul 13, 2019 due to GI bleed and received total of 7 units PRBCs, EGD completed while inpatient Jan 2021 with small esophageal varices without high risk stigmata, single large AVM on lesser curvature in gastric body s/p APC ablation, gastric antral and duodenal bulb polyposis. No significant source to explain transfusion dependent anemia. Colonoscopy with portal colopathy and diffuse edema, changes of Cdiff colitis on colonoscopy. Felt to have developing hepatorenal syndrome and had permcath placed, starting dialysis while inpatient. Appears at discharge, Hgb was 7.7. Several paras completed while inpatient.   Also notable for history of Cdiff colitis, initially diagnosed in Dec 2021, refractory to vanc with initial course,  completed 20 day course of Dificid. Repeat Cdiff positive 2/3 while inpatient at [facility], starting oral vanc and recommending 6 week taper of vanc with Questran for 6 weeks.   He reports intermittent black stool over the past few weeks. Last black stool yesterday morning. No melena since admission. Heme positive yesterday in ED. Feels  improved from admission yesterday. Very pleasant but quite talkative. Good historian. Denying any NSAIDs. No anticoagulation. He is on iron as outpatient but had not noticed black stool after discharge from [place] until just recently over past few weeks. No abdominal pain, no N/V. Stools have improved in consistency, have 1-2 soft stools a day and sometimes skipping a few days. Nearing the end of his vanc taper. No hematochezia. No confusion. He is well versed on signs of asterixis and states "see, I have just a small amount, much better than before".   Dialysis will be planned for tomorrow.   RUQ [place] completed April 2021 as outpatient, no mass. Cirrhotic appearing liver. Minimal ascites, insufficient for para.   Past Medical History:  Diagnosis Date  . A-fib [facility])    when initially stating dialysis january 2021 at [facility] per spouse; never heard anything else about it  . Alcoholic cirrhosis ([place])    patient reports completing hep a and b vaccines in 2006  . Anemia    has had 3 units of prbcs 2013  . Anxiety   . Asymptomatic gallstones    Ultrasound in 2006  . Cholelithiasis   . Chronic kidney disease   . Depression   . Diabetes ([place])   . Duodenal ulcer with hemorrhage    per patient in 2006 or 2007, records have been requested  . 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 HOH (hard of hearing)   . Hypertension   . Splenomegaly    Ultrasound in 2006  . Tubular adenoma of colon     Past Surgical History:  Procedure Laterality  Date  . APPENDECTOMY    . AV FISTULA PLACEMENT Left 08/17/2019   Procedure: ARTERIOVENOUS (AV) FISTULA CREATION VERSES ARTERIOVENOUS GRAFT;  Surgeon: [name], MD;  Location: [facility];  Service: Vascular;  Laterality: Left;  . BIOPSY  02/03/2018   Procedure: BIOPSY;  Surgeon: Daneil Dolin, MD;  [facility];  Service: Endoscopy;;  bx of gastric polyps  . CATARACT EXTRACTION Right   .  COLONOSCOPY  03/10/2005   Rectal polyp as described above, removed with snare. Left sided  diverticula. The remainder of the colonic mucosa appeared normal. Inflammed polyp on path.  . COLONOSCOPY  04/1999   Dr. Thea Silversmith polyps removed,  Path showed hyperplastic  . COLONOSCOPY  10/2015   Dr. Britta Mccreedy: diverticulosis, single sessile tubular adenoma 3-72mm in size removed from descending colon.   . COLONOSCOPY WITH ESOPHAGOGASTRODUODENOSCOPY (EGD)  03/31/2012   XTK:WIOXBDZ AVMs. Colonic diverticulosis. tubular adenoma colon  . COLONOSCOPY WITH ESOPHAGOGASTRODUODENOSCOPY (EGD)  06/2019   FORSYTH: small esophageal varices without high risk stigmata, single large AVM on lesser curvature in gastric body s/p APC ablation, gastric antral and duodenal bulb polyposis. No significant source to explain transfusion dependent anemia. Colonoscopy with portal colopathy and diffuse edema, changes of Cdiff colitis on colonoscopy  . ESOPHAGEAL BANDING N/A 02/03/2018   Procedure: ESOPHAGEAL BANDING;  Surgeon: Daneil Dolin, MD;  [facility];  Service: Endoscopy;  Laterality: N/A;  . ESOPHAGOGASTRODUODENOSCOPY  01/15/2005   Three columns grade 1 to 2 esophageal varices, otherwise normal esophageal mucosa.  Esophagus was not manipulated otherwise./Nodularity of the antrum with overlying erosions, nonspecific finding. Path showed rare H.pylori  . ESOPHAGOGASTRODUODENOSCOPY  09/2008   Dr. Gaylene Brooks columns of grade 2-3 esoph varices, only one column was prominent. Portal gastropathy, multiple gastrc polyps at antrum, two were 2cm with black eschar, bulbar polyps, bulbar erosions  . ESOPHAGOGASTRODUODENOSCOPY  11/2004   Dr. Brantley Stage 3 esoph varices  . ESOPHAGOGASTRODUODENOSCOPY  03/31/2012   RMR: 4 columns(3-GR 2, 1-GR1) non-bleeding esophageal varices, portal gastropathy, small HH, early GAVE, multiple gastric polyps   . ESOPHAGOGASTRODUODENOSCOPY (EGD) WITH PROPOFOL N/A 02/03/2018   Dr. Gala Romney:  Esophageal varices, 3 columns grade 1-2.  Portal hypertensive gastropathy.  Multiple gastric polyps, biopsy consistent with hyperplastic.  [name] GASTRIC VARICES BANDING  03/31/2012   Procedure: GASTRIC VARICES BANDING;  Surgeon: Daneil Dolin, MD;  [facility];  Service: Endoscopy;  Laterality: N/A;  . UMBILICAL HERNIA REPAIR  2017   [facility]    Prior to Admission medications   Medication Sig Start Date End Date Taking? Authorizing Provider  Ascorbic Acid (VITAMIN C) 1000 MG tablet Take 1,000 mg by mouth 2 (two) times daily.   Yes [provider]  cholestyramine Lucrezia Starch) 4 GM/DOSE powder Take 4 g by mouth in the morning.  08/03/19  Yes [provider]  diazepam (VALIUM) 5 MG tablet Take 5 mg by mouth daily as needed for anxiety.  09/11/19  Yes [provider]  furosemide (LASIX) 80 MG tablet Take 80 mg by mouth in the morning.  08/07/19  Yes [provider]  gabapentin (NEURONTIN) 100 MG capsule Take 100 mg by mouth at bedtime.  01/07/19  Yes [provider]  HYDROcodone-acetaminophen (NORCO) 10-325 MG tablet Take 1 tablet by mouth every 6 (six) hours as needed for moderate pain.  04/05/19  Yes [provider]  hydrOXYzine (VISTARIL) 25 MG capsule Take 25 mg by mouth 3 (three) times daily as needed for itching.  08/25/19  Yes [provider]  insulin degludec (TRESIBA FLEXTOUCH) 200 UNIT/ML FlexTouch Pen Inject 120 mLs into the skin at bedtime. *If levels are below 200-patient does not take 04/07/19  Yes [provider]  insulin lispro (HUMALOG) 100 UNIT/ML KiwkPen Inject 50 Units into the skin 3 (three) times daily.    Yes [provider]  iron polysaccharides (NIFEREX) 150 MG capsule Take 150 mg by mouth at bedtime.    Yes [provider]  metoprolol tartrate (LOPRESSOR) 25 MG tablet Take 25 mg by mouth 2 (two) times daily. 08/07/19  Yes [provider]  milk thistle 175 MG tablet Take 175 mg by  mouth 3 (three) times daily.    Yes [provider]  mirtazapine (REMERON) 15 MG tablet Take 15 mg by mouth at bedtime.   Yes [provider]  Multiple Vitamin (MULTIVITAMIN WITH MINERALS) TABS tablet Take 1 tablet by mouth in the morning.    Yes [provider]  omega-3 acid ethyl esters (LOVAZA) 1 g capsule Take 2 g by mouth 2 (two) times daily.  04/07/19  Yes [provider]  omeprazole (PRILOSEC) 40 MG capsule Take 40 mg by mouth in the morning.    Yes [provider]  pravastatin (PRAVACHOL) 20 MG tablet Take 20 mg by mouth in the morning.    Yes [provider]  Probiotic Product (PROBIOTIC DAILY PO) Take 1 tablet by mouth in the morning.    Yes [provider]  rifaximin (XIFAXAN) 550 MG TABS tablet Take 1 tablet (550 mg total) by mouth 2 (two) times daily. 01/13/19  Yes Mahala Menghini, PA-C  spironolactone (ALDACTONE) 50 MG tablet Take 50 mg by mouth in the morning.    Yes [provider]  tamsulosin (FLOMAX) 0.4 MG CAPS capsule Take 0.4 mg by mouth every evening.   Yes [provider]  thiamine 100 MG tablet Take 100 mg by mouth in the morning. Vitamin B-1   Yes [provider]  tiZANidine (ZANAFLEX) 2 MG tablet Take 2 mg by mouth every 6 (six) hours as needed for muscle spasms.    Yes [provider]  triamcinolone cream (KENALOG) 0.1 % Apply 1 application topically 2 (two) times daily as needed (dry skin Itching).  12/30/17  Yes [provider]  Vancomycin HCl 25 MG/ML SOLR Take 125 mg by mouth See admin instructions. Starting on 08/27/2019 take 64mls by mouth 4 times daily for 2 weeks, then take 23mls three times daily for 2 weeks, then take 5 mls twice daily as directed 08/27/19  Yes [provider]  venlafaxine (EFFEXOR) 75 MG tablet Take 75 mg by mouth 3 (three) times daily with meals.    Yes [provider]    Current Facility-Administered Medications  Medication  Dose Route Frequency Provider Last Rate Last Admin  . 0.9 %  sodium chloride infusion  10 mL/hr Intravenous Once Howerter, Justin B, DO      . acetaminophen (TYLENOL) tablet 650 mg  650 mg Oral Q6H PRN Howerter, Justin B, DO      . cefTRIAXone (ROCEPHIN) 1 g in sodium chloride 0.9 % 100 mL IVPB  1 g Intravenous Q24H Howerter, Justin B, DO 200 mL/hr at 09/19/19 0345 1 g at 09/19/19 0345  . insulin aspart (novoLOG) injection 0-9 Units  0-9 Units Subcutaneous Q6H Howerter, Justin B, DO      . insulin glargine (LANTUS) injection 30 Units  30 Units Subcutaneous QHS Howerter, Justin B, DO  30 Units at 09/19/19 0343  . ondansetron (ZOFRAN) injection 4 mg  4 mg Intravenous Q6H PRN Howerter, Justin B, DO      . pantoprazole (PROTONIX) injection 40 mg  40 mg Intravenous Q12H Howerter, Justin B, DO   40 mg at 09/19/19 0925  . rifaximin (XIFAXAN) tablet 550 mg  550 mg Oral BID Howerter, Justin B, DO   550 mg at 09/19/19 0925  . sodium chloride flush (NS) 0.9 % injection 3 mL  3 mL Intravenous Q12H Howerter, Justin B, DO   10 mL at 09/19/19 0925    Allergies as of 09/18/2019 - Review Complete 09/18/2019  Allergen Reaction Noted  . Tape Itching and Rash 09/18/2019    Family History  Problem Relation Age of Onset  . Colon cancer Father 18       deceased age 37  . Breast cancer Sister        deceased  . Breast cancer Sister   . Stroke Mother   . Lung cancer Neg Hx   . Ovarian cancer Neg Hx     Social History   Socioeconomic History  . Marital status: Married    Spouse name: Not on file  . Number of children: 2  . Years of education: Not on file  . Highest education level: Not on file  Occupational History  . Occupation: Disabled    Employer: SELF EMPLOYED  Tobacco Use  . Smoking status: Former Smoker    Packs/day: 0.50    Years: 25.00    Pack years: 12.50    Types: Cigarettes    Quit date: 01/28/1986    Years since quitting: 33.6  . Smokeless tobacco: Never Used  Substance and Sexual  Activity  . Alcohol use: No    Comment: quit in 05/2013  . Drug use: No  . Sexual activity: Not Currently  Other Topics Concern  . Not on file  Social History Narrative   Two step children from second marriage (divorced) who live with him along with some grandchildren.    Social Determinants of Health   Financial Resource Strain:   . Difficulty of Paying Living Expenses:   Food Insecurity:   . Worried About Charity fundraiser in the Last Year:   . Arboriculturist in the Last Year:   Transportation [name]:   . Film/video editor (Medical):   [name] Lack of Transportation (Non-Medical):   Physical Activity:   . Days of Exercise per Week:   . Minutes of Exercise per Session:   Stress:   . Feeling of Stress :   Social Connections:   . Frequency of Communication with Friends and Family:   . Frequency of Social Gatherings with Friends and Family:   . Attends Religious Services:   . Active Member of Clubs or Organizations:   . Attends Archivist Meetings:   [name] Marital Status:   Intimate Partner Violence:   . Fear of Current or Ex-Partner:   . Emotionally Abused:   [name] Physically Abused:   . Sexually Abused:     Review of Systems: Gen: see HPI CV: Denies chest pain, heart palpitations, syncope, edema  Resp: +DOE GI: see HPI GU : Denies urinary burning, urinary frequency, urinary incontinence.  MS: +right knee pain Derm: Denies rash, itching, dry skin Psych: Denies depression, anxiety,confusion, or memory loss Heme: see HPI  Physical Exam: Vital signs in last 24 hours: Temp:  [97.7 F (36.5 C)-99.3 F (37.4 C)] 98  F (36.7 C) (04/20 0744) Pulse Rate:  [83-93] 93 (04/20 0744) Resp:  [15-21] 16 (04/20 0744) BP: (88-148)/(41-79) 129/49 (04/20 0700) SpO2:  [74 %-100 %] 91 % (04/20 0744) Weight:  [250 kg] 117 kg (04/20 0500)   General:   Alert,  Well-developed, well-nourished, pleasant and cooperative in NAD, appears younger than stated age Head:  Normocephalic  and atraumatic. Eyes:  Sclera clear, no icterus.   Ears:  Normal auditory acuity. Nose:  No deformity, discharge,  or lesions. Mouth:  No deformity or lesions, dentition normal. Lungs:  Clear throughout to auscultation.    Heart:  S1 S1 present with systolic murmur Abdomen:  Obese, +BS, distended/round but soft, midline scar with questionable recurrent hernia, small amount anasarca bilateral flanks Rectal:  Deferred  Msk:  Symmetrical without gross deformities. Normal posture. Extremities:  With pedal edema, query chronic venous stasis changes bilateral lower extremities, 2+ pitting edema above knee, right knee with edema > left Neurologic:  Alert and  oriented x4; very minimal asterixis  Skin:  Intact without significant lesions or rashes. Psych:  Alert and cooperative. Normal mood and affect.  Intake/Output from previous day: 04/19 0701 - 04/20 0700 In: 368 [Blood:368] Out: -  Intake/Output this shift: No intake/output data recorded.  Lab Results: Recent Labs    09/18/19 1715 09/19/19 0828  WBC 3.4* 3.9*  HGB 5.4* 7.0*  HCT 18.7* 24.1*  PLT 124* 127*   BMET Recent Labs    09/18/19 1715 09/19/19 0828  NA 132* 136  K 3.8 3.8  CL 96* 97*  CO2 23 26  GLUCOSE 126* 91  BUN 22 27*  CREATININE 2.75* 3.38*  CALCIUM 8.1* 8.9   LFT Recent Labs    09/18/19 1715 09/19/19 0828  PROT 7.0 6.9  ALBUMIN 2.8* 2.8*  AST 42* 29  ALT 19 21  ALKPHOS 98 93  BILITOT 0.9 1.0   PT/INR Recent Labs    09/18/19 1715 09/19/19 0828  LABPROT 15.0 15.1  INR 1.2 1.2    Studies/Results: DG Chest Port 1 View  Result Date: 09/18/2019 CLINICAL DATA:  Shortness of breath EXAM: PORTABLE CHEST 1 VIEW COMPARISON:  05/31/2019 FINDINGS: Right dialysis catheter in place with the tip in the upper right atrium. No pneumothorax. Cardiomegaly with vascular congestion. No confluent opacities or effusions. No acute bony abnormality. IMPRESSION: Right dialysis catheter tip in the upper right  atrium. Cardiomegaly, vascular congestion. Electronically Signed   By: Rolm Baptise M.D.   On: 09/18/2019 19:03    Impression: Pleasant 74 year old male with history of ETOH cirrhosis (MELD Na 23 on dialysis, Child Pugh B), history of transfusion dependent anemia, encephalopathy, refractory Cdiff that has improved with prolonged vanc taper, presenting with acute on chronic anemia, heme positive stool, and reports of melena. Admitting Hgb 5.4, improved to 7.0 s/p 2 units PRBCs. Recent Hgbs have been in the 7/8 range.   Doubt dealing with variceal bleed; it is notable he was inpatient at [facility] receiving 7 units PRBCs earlier this year (Jan/Feb 2021), s/p EGD and colonoscopy with small esophageal varices without high risk stigmata, single large AVM on lesser curvature in gastric body s/p APC ablation, gastric antral and duodenal bulb polyposis. Colonoscopy with portal colopathy and diffuse edema, changes of Cdiff colitis on colonoscopy. From what I can see, he has not had complete imaging of small bowel via capsule study. Suspect may have small bowel source as etiology, less likely variceal bleed. Denying any NSAIDs, no anticoagulation. Would recommend holding heparin during  dialysis if at all possible. He has had clear liquids this morning; continue with supportive measures, transfuse as needed, and will need likely EGD with capsule. Empiric antibiotics for SBP prophylaxis have been started already. Hold off on octreotide unless active, overt GI bleeding.  History of refractory Cdiff: initial diagnosis in Dec 2021 without improvement on Vanc, completing course of Dificid X 20 days, repeat Cdiff positive while at [facility] on 2/3, discharged on oral vancomycin with 6 week taper. Nearing the end of this taper and has had good response with return to normal bowel movements. If recurrence, recommend evaluation for FMT.   ESRD: on dialysis. Plans for tomorrow.   Plan:  Transfuse as needed, follow H/H  PPI  BID  Continue Xifaxan BID, risks outweigh benefits. Currently not on lactulose but would avoid in setting of recent Cdiff and clouding clinical picture  Will need to determine status of vanc taper: [name] to continue while inpatient to complete whole course. Nearing the end per patient.   Clear liquids for now: he has already eaten breakfast.   Would benefit from repeat evaluation of UGI tract and small bowel in setting of transfusion dependent anemia; discussing with Dr. Oneida Alar. Colonoscopy on file earlier this year.  Judicious use of antibiotics with history of refractory Cdiff  If possible, avoid heparin during dialysis   Jeffrey Needs, PhD, ANP-BC [facility]       LOS: 0 days    09/19/2019, 9:34 AM  Addendum at 1030: further review of chart, exploratory laparotomy in 2017, washout, ventral hernia repair. Likely will need agile prior to capsule. NPO now with plans for EGD with Propofol today.

## 2019-09-19 NOTE — Progress Notes (Signed)
PROGRESS NOTE   Jeffrey [name]  PNT:[id number] DOB: Sep 18, 1945 DOA: 09/18/2019 PCP: Monico Blitz, MD   Chief Complaint  Patient presents with  . Fatigue  . Shortness of Breath    Brief Narrative:  74 y.o. male with medical history significant for end-stage renal disease on M/W/F hemodialysis, chronic anemia with baseline hemoglobin 0.8-6.7, alcoholic cirrhosis, type 2 diabetes mellitus, who is admitted to [facility] on 09/18/2019 with acute on chronic anemia after presenting from home to [facility] Emergency Department at the direction of hemodialysis center for evaluation of low hemoglobin.   Assessment & Plan:   Principal Problem:   Acute on chronic anemia Active Problems:   Cirrhosis (HCC)   Generalized weakness   DM2 (diabetes mellitus, type 2) (HCC)   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[place])   1. Acute on chronic blood loss anemia-patient reports having black stool and Hemoccult positive in the ED.  Hemoglobin down to 5.4.  He is status post 2 units PRBC and starting to feel better.  He has been seen by the GI team and they are planning for upper endoscopy later today.  CBC in a.m. 2. Dyspnea-likely was secondary to anemia.  Is feeling better after transfusion. 3. Generalized weakness-likely secondary to acute on chronic anemia.  When medically stabilized we will ask for PT evaluation. 4. End-stage renal disease on hemodialysis Monday Wednesday Friday that she has been seen by nephrology and they are making plans for resumption of hemodialysis 09/20/19.  5. 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  He is followed closely by his GI doctor work.  Follow PT/INR and LFTs. 6. History of C. difficile colitis-patient has had severe recalcitrant disease and has been on a 6-week course of oral vancomycin tapered.  He is not having any abdominal symptoms at this time.  He had received a dose of ceftriaxone in the ED for SBP prophylaxis 7. Type 2 diabetes  mellitus-poorly controlled as evidenced by an A1c of 8.2%.  He has been restarted on basal and bolus insulin with supplemental sliding scale coverage.  Follow closely and adjust as needed.   DVT prophylaxis: SCDs Code Status: Full Family Communication: Wife telephone update Disposition:   Status is: Observation  The patient will require care spanning > 2 midnights and should be moved to inpatient because: Ongoing diagnostic testing needed not appropriate for outpatient work up and IV treatments appropriate due to intensity of illness or inability to take PO  Dispo: The patient is from: Home              Anticipated d/c is to: Home              Anticipated d/c date is: 2 days              Patient currently is not medically stable to d/c.       Consultants:   GI   Procedures:   EGD (propofol) 09/19/19  Antimicrobials: ceftriaxone x 1 dose   Subjective: Pt reports having black stools.  He denies CP, abd pain, SOB, fever or chills.    Objective: Vitals:   09/19/19 0500 09/19/19 0600 09/19/19 0700 09/19/19 0744  BP: (!) 128/59 (!) 139/41 (!) 129/49   Pulse: 86 92 88 93  Resp: 17 16 16 16   Temp:    98 F (36.7 C)  TempSrc:    Oral  SpO2: 96% 99% 95% 91%  Weight: 117 kg     Height:  Intake/Output Summary (Last 24 hours) at 09/19/2019 1047 Last data filed at 09/19/2019 0300 Gross per 24 hour  Intake 368 ml  Output --  Net 368 ml   Filed Weights   09/18/19 2206 09/19/19 0500  Weight: 117 kg 117 kg    Examination:  General exam: Appears calm and comfortable  Respiratory system: Clear to auscultation. Respiratory effort normal. Cardiovascular system: S1 & S2 heard, RRR. No JVD, murmurs, rubs, gallops or clicks. No pedal edema. Gastrointestinal system: Abdomen is nondistended, soft and nontender. No organomegaly or masses felt. Normal bowel sounds heard. Central nervous system: Alert and oriented. No focal neurological deficits. Extremities: Symmetric 5 x 5  power. Skin: No rashes, lesions or ulcers Psychiatry: Judgement and insight appear normal. Mood & affect appropriate.   Data Reviewed: I have personally reviewed following labs and imaging studies  CBC: Recent Labs  Lab 09/18/19 1715 09/19/19 0828  WBC 3.4* 3.9*  NEUTROABS 2.5  --   HGB 5.4* 7.0*  HCT 18.7* 24.1*  MCV 108.1* 102.1*  PLT 124* 127*    Basic Metabolic Panel: Recent Labs  Lab 09/18/19 1715 09/19/19 0828  NA 132* 136  K 3.8 3.8  CL 96* 97*  CO2 23 26  GLUCOSE 126* 91  BUN 22 27*  CREATININE 2.75* 3.38*  CALCIUM 8.1* 8.9  MG 2.1 2.1  PHOS  --  6.1*    GFR: Estimated Creatinine Clearance: 24.2 mL/min (A) (by C-G formula based on SCr of 3.38 mg/dL (H)).  Liver Function Tests: Recent Labs  Lab 09/18/19 1715 09/19/19 0828  AST 42* 29  ALT 19 21  ALKPHOS 98 93  BILITOT 0.9 1.0  PROT 7.0 6.9  ALBUMIN 2.8* 2.8*    CBG: Recent Labs  Lab 09/19/19 0536 09/19/19 0634  GLUCAP 62* 87     Recent Results (from the past 240 hour(s))  Respiratory Panel by RT PCR (Flu A&B, Covid) - Nasopharyngeal Swab     Status: None   Collection Time: 09/18/19  8:05 PM   Specimen: Nasopharyngeal Swab  Result Value Ref Range Status   SARS Coronavirus 2 by RT PCR NEGATIVE NEGATIVE Final    Comment: (NOTE) SARS-CoV-2 target nucleic acids are NOT DETECTED. The SARS-CoV-2 RNA is generally detectable in upper respiratoy specimens during the acute phase of infection. The lowest concentration of SARS-CoV-2 viral copies this assay can detect is 131 copies/mL. A negative result does not preclude SARS-Cov-2 infection and should not be used as the sole basis for treatment or other patient management decisions. A negative result may occur with  improper specimen collection/handling, submission of specimen other than nasopharyngeal swab, presence of viral mutation(s) within the areas targeted by this assay, and inadequate number of viral copies (<131 copies/mL). A negative  result must be combined with clinical observations, patient history, and epidemiological information. The expected result is Negative. Fact Sheet for Patients:  PinkCheek.be Fact Sheet for Healthcare Providers:  GravelBags.it This test is not yet ap proved or cleared by the [place] FDA and  has been authorized for detection and/or diagnosis of SARS-CoV-2 by FDA under an Emergency Use Authorization (EUA). This EUA will remain  in effect (meaning this test can be used) for the duration of the COVID-19 declaration under Section 564(b)(1) of the Act, 21 U.S.C. section 360bbb-3(b)(1), unless the authorization is terminated or revoked sooner.    Influenza A by PCR NEGATIVE NEGATIVE Final   Influenza B by PCR NEGATIVE NEGATIVE Final    Comment: (NOTE) The Xpert  Xpress SARS-CoV-2/FLU/RSV assay is intended as an aid in  the diagnosis of influenza from Nasopharyngeal swab specimens and  should not be used as a sole basis for treatment. Nasal washings and  aspirates are unacceptable for Xpert Xpress SARS-CoV-2/FLU/RSV  testing. Fact Sheet for Patients: PinkCheek.be Fact Sheet for Healthcare Providers: GravelBags.it This test is not yet approved or cleared by the [place] FDA and  has been authorized for detection and/or diagnosis of SARS-CoV-2 by  FDA under an Emergency Use Authorization (EUA). This EUA will remain  in effect (meaning this test can be used) for the duration of the  Covid-19 declaration under Section 564(b)(1) of the Act, 21  U.S.C. section 360bbb-3(b)(1), unless the authorization is  terminated or revoked. Performed at [facility], [address] [place], [place] [address]   MRSA PCR Screening     Status: None   Collection Time: 09/18/19  9:57 PM   Specimen: Nasopharyngeal  Result Value Ref Range Status   MRSA by PCR NEGATIVE NEGATIVE Final     Comment:        The GeneXpert MRSA Assay (FDA approved for NASAL specimens only), is one component of a comprehensive MRSA colonization surveillance program. It is not intended to diagnose MRSA infection nor to guide or monitor treatment for MRSA infections. Performed at [facility], [address], [place], [place] [address]     Radiology Studies: [facility] 1 View  Result Date: 09/18/2019 CLINICAL DATA:  Shortness of breath EXAM: PORTABLE CHEST 1 VIEW COMPARISON:  05/31/2019 FINDINGS: Right dialysis catheter in place with the tip in the upper right atrium. No pneumothorax. Cardiomegaly with vascular congestion. No confluent opacities or effusions. No acute bony abnormality. IMPRESSION: Right dialysis catheter tip in the upper right atrium. Cardiomegaly, vascular congestion. Electronically Signed   By: Rolm Baptise M.D.   On: 09/18/2019 19:03   Scheduled Meds: . darbepoetin (ARANESP) injection - NON-DIALYSIS  60 mcg Subcutaneous Q Tue-1800  . insulin aspart  0-9 Units Subcutaneous Q6H  . insulin glargine  30 Units Subcutaneous QHS  . pantoprazole (PROTONIX) IV  40 mg Intravenous Q12H  . rifaximin  550 mg Oral BID  . sodium chloride flush  3 mL Intravenous Q12H   Continuous Infusions: . sodium chloride       LOS: 0 days    Critical Care Procedure Note Authorized and Performed by: [name] MD  Total Critical Care time:  31 mins Due to a high probability of clinically significant, life threatening deterioration, the patient required my highest level of preparedness to intervene emergently and I personally spent this critical care time directly and personally managing the patient.  This critical care time included obtaining a history; examining the patient, pulse oximetry; ordering and review of studies; arranging urgent treatment with development of a management plan; evaluation of patient's response of treatment; frequent reassessment; and discussions with other providers.   This critical care time was performed to assess and manage the high probability of imminent and life threatening deterioration that could result in multi-organ failure.  It was exclusive of separately billable procedures and treating other patients and teaching time.    Jeffrey Brakeman, MD [facility]  To contact the attending provider between 7A-7P or the covering provider during after hours 7P-7A, please log into the web site [email or web address] and access using universal [place] password for that web site. If you do not have the password, please call the hospital operator.  09/19/2019, 10:47 AM

## 2019-09-20 ENCOUNTER — Other Ambulatory Visit: Payer: Self-pay | Admitting: *Deleted

## 2019-09-20 ENCOUNTER — Encounter (HOSPITAL_COMMUNITY)
Admission: EM | Disposition: A | Discharge status: Home Health | Payer: Self-pay | Source: Home / Self Care | Attending: Internal Medicine

## 2019-09-20 DIAGNOSIS — D62 Acute posthemorrhagic anemia: Secondary | ICD-10-CM | POA: Diagnosis not present

## 2019-09-20 DIAGNOSIS — Z803 Family history of malignant neoplasm of breast: Secondary | ICD-10-CM | POA: Diagnosis not present

## 2019-09-20 DIAGNOSIS — N250 Renal osteodystrophy: Secondary | ICD-10-CM | POA: Diagnosis not present

## 2019-09-20 DIAGNOSIS — R0602 Shortness of breath: Secondary | ICD-10-CM | POA: Diagnosis present

## 2019-09-20 DIAGNOSIS — N186 End stage renal disease: Secondary | ICD-10-CM

## 2019-09-20 DIAGNOSIS — I120 Hypertensive chronic kidney disease with stage 5 chronic kidney disease or end stage renal disease: Secondary | ICD-10-CM | POA: Diagnosis not present

## 2019-09-20 DIAGNOSIS — I8500 Esophageal varices without bleeding: Secondary | ICD-10-CM | POA: Diagnosis not present

## 2019-09-20 DIAGNOSIS — Z794 Long term (current) use of insulin: Secondary | ICD-10-CM | POA: Diagnosis not present

## 2019-09-20 DIAGNOSIS — H9190 Unspecified hearing loss, unspecified ear: Secondary | ICD-10-CM | POA: Diagnosis present

## 2019-09-20 DIAGNOSIS — M898X9 Other specified disorders of bone, unspecified site: Secondary | ICD-10-CM | POA: Diagnosis present

## 2019-09-20 DIAGNOSIS — Z20822 Contact with and (suspected) exposure to covid-19: Secondary | ICD-10-CM | POA: Diagnosis not present

## 2019-09-20 DIAGNOSIS — K317 Polyp of stomach and duodenum: Secondary | ICD-10-CM | POA: Diagnosis present

## 2019-09-20 DIAGNOSIS — D631 Anemia in chronic kidney disease: Secondary | ICD-10-CM | POA: Diagnosis present

## 2019-09-20 DIAGNOSIS — K922 Gastrointestinal hemorrhage, unspecified: Secondary | ICD-10-CM

## 2019-09-20 DIAGNOSIS — K625 Hemorrhage of anus and rectum: Secondary | ICD-10-CM | POA: Diagnosis not present

## 2019-09-20 DIAGNOSIS — Z8711 Personal history of peptic ulcer disease: Secondary | ICD-10-CM | POA: Diagnosis not present

## 2019-09-20 DIAGNOSIS — Z87891 Personal history of nicotine dependence: Secondary | ICD-10-CM | POA: Diagnosis not present

## 2019-09-20 DIAGNOSIS — Z992 Dependence on renal dialysis: Secondary | ICD-10-CM

## 2019-09-20 DIAGNOSIS — N2581 Secondary hyperparathyroidism of renal origin: Secondary | ICD-10-CM | POA: Diagnosis not present

## 2019-09-20 DIAGNOSIS — Z800 Family history of malignant neoplasm of digestive organs: Secondary | ICD-10-CM | POA: Diagnosis not present

## 2019-09-20 DIAGNOSIS — F419 Anxiety disorder, unspecified: Secondary | ICD-10-CM | POA: Diagnosis present

## 2019-09-20 DIAGNOSIS — A0472 Enterocolitis due to Clostridium difficile, not specified as recurrent: Secondary | ICD-10-CM | POA: Diagnosis not present

## 2019-09-20 DIAGNOSIS — K7031 Alcoholic cirrhosis of liver with ascites: Secondary | ICD-10-CM | POA: Diagnosis not present

## 2019-09-20 DIAGNOSIS — E1122 Type 2 diabetes mellitus with diabetic chronic kidney disease: Secondary | ICD-10-CM | POA: Diagnosis not present

## 2019-09-20 DIAGNOSIS — K921 Melena: Secondary | ICD-10-CM | POA: Diagnosis not present

## 2019-09-20 DIAGNOSIS — D649 Anemia, unspecified: Secondary | ICD-10-CM | POA: Diagnosis not present

## 2019-09-20 DIAGNOSIS — I4891 Unspecified atrial fibrillation: Secondary | ICD-10-CM | POA: Diagnosis not present

## 2019-09-20 DIAGNOSIS — N189 Chronic kidney disease, unspecified: Secondary | ICD-10-CM

## 2019-09-20 DIAGNOSIS — K766 Portal hypertension: Secondary | ICD-10-CM | POA: Diagnosis not present

## 2019-09-20 DIAGNOSIS — K7030 Alcoholic cirrhosis of liver without ascites: Secondary | ICD-10-CM | POA: Diagnosis not present

## 2019-09-20 DIAGNOSIS — Z823 Family history of stroke: Secondary | ICD-10-CM | POA: Diagnosis not present

## 2019-09-20 DIAGNOSIS — E1129 Type 2 diabetes mellitus with other diabetic kidney complication: Secondary | ICD-10-CM | POA: Diagnosis not present

## 2019-09-20 HISTORY — PX: AGILE CAPSULE: SHX5420

## 2019-09-20 LAB — GLUCOSE, CAPILLARY
Glucose-Capillary: 102 mg/dL — ABNORMAL HIGH (ref 70–99)
Glucose-Capillary: 115 mg/dL — ABNORMAL HIGH (ref 70–99)
Glucose-Capillary: 122 mg/dL — ABNORMAL HIGH (ref 70–99)
Glucose-Capillary: 122 mg/dL — ABNORMAL HIGH (ref 70–99)
Glucose-Capillary: 88 mg/dL (ref 70–99)
Glucose-Capillary: 93 mg/dL (ref 70–99)

## 2019-09-20 LAB — COMPREHENSIVE METABOLIC PANEL
ALT: 20 U/L (ref 0–44)
AST: 30 U/L (ref 15–41)
Albumin: 2.8 g/dL — ABNORMAL LOW (ref 3.5–5.0)
Alkaline Phosphatase: 97 U/L (ref 38–126)
Anion gap: 13 (ref 5–15)
BUN: 32 mg/dL — ABNORMAL HIGH (ref 8–23)
CO2: 21 mmol/L — ABNORMAL LOW (ref 22–32)
Calcium: 8.9 mg/dL (ref 8.9–10.3)
Chloride: 97 mmol/L — ABNORMAL LOW (ref 98–111)
Creatinine, Ser: 3.9 mg/dL — ABNORMAL HIGH (ref 0.61–1.24)
GFR calc Af Amer: 17 mL/min — ABNORMAL LOW (ref >60)
GFR calc non Af Amer: 14 mL/min — ABNORMAL LOW (ref >60)
Glucose, Bld: 91 mg/dL (ref 70–99)
Potassium: 3.8 mmol/L (ref 3.5–5.1)
Sodium: 131 mmol/L — ABNORMAL LOW (ref 135–145)
Total Bilirubin: 1.1 mg/dL (ref 0.3–1.2)
Total Protein: 6.9 g/dL (ref 6.5–8.1)

## 2019-09-20 LAB — CBC WITH DIFFERENTIAL/PLATELET
Abs Immature Granulocytes: 0.01 10*3/uL (ref 0.00–0.07)
Basophils Absolute: 0 10*3/uL (ref 0.0–0.1)
Basophils Relative: 1 %
Eosinophils Absolute: 0.5 10*3/uL (ref 0.0–0.5)
Eosinophils Relative: 12 %
HCT: 26.6 % — ABNORMAL LOW (ref 39.0–52.0)
Hemoglobin: 7.9 g/dL — ABNORMAL LOW (ref 13.0–17.0)
Immature Granulocytes: 0 %
Lymphocytes Relative: 8 %
Lymphs Abs: 0.4 10*3/uL — ABNORMAL LOW (ref 0.7–4.0)
MCH: 29.5 pg (ref 26.0–34.0)
MCHC: 29.7 g/dL — ABNORMAL LOW (ref 30.0–36.0)
MCV: 99.3 fL (ref 80.0–100.0)
Monocytes Absolute: 0.4 10*3/uL (ref 0.1–1.0)
Monocytes Relative: 9 %
Neutro Abs: 3.1 10*3/uL (ref 1.7–7.7)
Neutrophils Relative %: 70 %
Platelets: 124 10*3/uL — ABNORMAL LOW (ref 150–400)
RBC: 2.68 MIL/uL — ABNORMAL LOW (ref 4.22–5.81)
RDW: 22.2 % — ABNORMAL HIGH (ref 11.5–15.5)
WBC: 4.4 10*3/uL (ref 4.0–10.5)
nRBC: 0 % (ref 0.0–0.2)

## 2019-09-20 LAB — HEPATITIS B SURFACE ANTIGEN: Hepatitis B Surface Ag: NONREACTIVE

## 2019-09-20 LAB — MAGNESIUM: Magnesium: 2.2 mg/dL (ref 1.7–2.4)

## 2019-09-20 SURGERY — AGILE CAPSULE

## 2019-09-20 MED ORDER — DARBEPOETIN ALFA 60 MCG/0.3ML IJ SOSY: 60.0000 ug | PREFILLED_SYRINGE | INTRAMUSCULAR | Status: DC

## 2019-09-20 MED ORDER — SODIUM CHLORIDE 0.9 % IV SOLN: INTRAVENOUS | Status: DC

## 2019-09-20 MED ORDER — SODIUM CHLORIDE 0.9 % IV SOLN: 100.0000 mL | INTRAVENOUS | Status: DC | PRN

## 2019-09-20 MED ORDER — ALTEPLASE 2 MG IJ SOLR: 2.0000 mg | Freq: Once | INTRAMUSCULAR | Status: DC | PRN

## 2019-09-20 MED ORDER — FUROSEMIDE 80 MG PO TABS
80.0000 mg | ORAL_TABLET | Freq: Every day | ORAL | Status: DC
  Administered 2019-09-20 – 2019-09-22 (×3): 80 mg via ORAL
  Filled 2019-09-20 (×3): qty 1

## 2019-09-20 NOTE — Procedures (Signed)
     HEMODIALYSIS TREATMENT NOTE:  4 hour heparin-free HD completed via RIJ TDC. Exit site unremarkable. Goal met: 4 liters removed without interruption in ultrafiltration.  All blood was returned.  Rockwell Alexandria, RN

## 2019-09-20 NOTE — Progress Notes (Signed)
PROGRESS NOTE    Jeffrey [name]  ZOX:[id number] DOB: 11-04-1945 DOA: 09/18/2019 PCP: Monico Blitz, MD   Brief Narrative:  74 y.o.malewith medical history significant forend-stage renal disease onM/W/Fhemodialysis,chronic anemia with baseline hemoglobin 8.1-1.9, alcoholic cirrhosis, type 2 diabetes mellitus,who is admitted to [facility] on4/19/2021with acute on chronic anemiaafter presenting from home to [facility] Emergency Department at the direction of hemodialysis center for evaluation of low hemoglobin.  4/21: Plan to transfer to telemetry today.  No acute bleeding noted.  Patient given agile capsule with plans for capsule endoscopy in a.m. if capsule passes.  Plan for hemodialysis as well today.  Assessment & Plan:   Principal Problem:   Acute on chronic anemia Active Problems:   Cirrhosis (HCC)   Generalized weakness   DM2 (diabetes mellitus, type 2) (HCC)   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HCC)   Gastrointestinal hemorrhage   Acute blood loss anemia   1. Acute on chronic blood loss anemia-patient reports having black stool and Hemoccult positive in the ED.  Hemoglobin down to 5.4.  Hemoglobin is currently at 7.9 and stable with no acute bleeding.  EGD performed 4/20 with findings of likely friable mucosa related to portal hypertension and no active bleeding.  Agile capsule given today with plans for capsule endoscopy in a.m. 2. Dyspnea-likely was secondary to anemia.  Is feeling better after transfusion. 3. Generalized weakness-likely secondary to acute on chronic anemia.  When medically stabilized we will ask for PT evaluation. 4. End-stage renal disease on hemodialysis Monday Wednesday Friday that she has been seen by nephrology with plans for hemodialysis today. 5. 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  He is followed closely by his GI doctor work.  Follow PT/INR and LFTs. 6. History of C. difficile colitis-patient has had severe  recalcitrant disease and has been on a 6-week course of oral vancomycin tapered.  He is not having any abdominal symptoms at this time.  He had received a dose of ceftriaxone in the ED for SBP prophylaxis 7. Type 2 diabetes mellitus-poorly controlled as evidenced by an A1c of 8.2%.  He has been restarted on basal and bolus insulin with supplemental sliding scale coverage.  Follow closely and adjust as needed.   DVT prophylaxis:SCDs Code Status: Full code Family Communication: Plan to call wife for update Disposition Plan: Dummy capsule per GI today with small capsule plan for a.m. transfer to telemetry.  Hemodialysis today.   Consultants:   GI  Nephrology  Procedures:   EGD 4/20  Antimicrobials:  Anti-infectives (From admission, onward)   Start     Dose/Rate Route Frequency Ordered Stop   09/18/19 2200  rifaximin (XIFAXAN) tablet 550 mg     550 mg Oral 2 times daily 09/18/19 2049     09/18/19 2115  cefTRIAXone (ROCEPHIN) 1 g in sodium chloride 0.9 % 100 mL IVPB  Status:  Discontinued     1 g 200 mL/hr over 30 Minutes Intravenous Every 24 hours 09/18/19 2051 09/19/19 1044       Subjective: Patient seen and evaluated today with no new acute complaints or concerns. No acute concerns or events noted overnight.  He denies any abdominal pain and there has been no overt bleeding.  He continues to feel weak.  Objective: Vitals:   09/20/19 1100 09/20/19 1123 09/20/19 1200 09/20/19 1245  BP: (!) 173/66  (!) 148/62   Pulse: 98  (!) 103 100  Resp:    15  Temp:  97.8  F (36.6 C)    TempSrc:  Oral    SpO2: 96%  93% 99%  Weight:      Height:        Intake/Output Summary (Last 24 hours) at 09/20/2019 1414 Last data filed at 09/20/2019 1342 Gross per 24 hour  Intake 546 ml  Output 550 ml  Net -4 ml   Filed Weights   09/18/19 2206 09/19/19 0500 09/20/19 0500  Weight: 117 kg 117 kg 119.2 kg    Examination:  General exam: Appears calm and comfortable  Respiratory system:  Clear to auscultation. Respiratory effort normal.  Currently on 2 L nasal cannula oxygen. Cardiovascular system: S1 & S2 heard, RRR. No JVD, murmurs, rubs, gallops or clicks. No pedal edema. Gastrointestinal system: Abdomen is nondistended, soft and nontender. No organomegaly or masses felt. Normal bowel sounds heard. Central nervous system: Alert and oriented. No focal neurological deficits. Extremities: Symmetric 5 x 5 power. Skin: No rashes, lesions or ulcers Psychiatry: Judgement and insight appear normal. Mood & affect appropriate.     Data Reviewed: I have personally reviewed following labs and imaging studies  CBC: Recent Labs  Lab 09/18/19 1715 09/19/19 0828 09/20/19 0423  WBC 3.4* 3.9* 4.4  NEUTROABS 2.5  --  3.1  HGB 5.4* 7.0* 7.9*  HCT 18.7* 24.1* 26.6*  MCV 108.1* 102.1* 99.3  PLT 124* 127* 401*   Basic Metabolic Panel: Recent Labs  Lab 09/18/19 1715 09/19/19 0828 09/20/19 0423  NA 132* 136 131*  K 3.8 3.8 3.8  CL 96* 97* 97*  CO2 23 26 21*  GLUCOSE 126* 91 91  BUN 22 27* 32*  CREATININE 2.75* 3.38* 3.90*  CALCIUM 8.1* 8.9 8.9  MG 2.1 2.1 2.2  PHOS  --  6.1*  --    GFR: Estimated Creatinine Clearance: 21.1 mL/min (A) (by C-G formula based on SCr of 3.9 mg/dL (H)). Liver Function Tests: Recent Labs  Lab 09/18/19 1715 09/19/19 0828 09/20/19 0423  AST 42* 29 30  ALT 19 21 20   ALKPHOS 98 93 97  BILITOT 0.9 1.0 1.1  PROT 7.0 6.9 6.9  ALBUMIN 2.8* 2.8* 2.8*   No results for input(s): LIPASE, AMYLASE in the last 168 hours. No results for input(s): AMMONIA in the last 168 hours. Coagulation Profile: Recent Labs  Lab 09/18/19 1715 09/19/19 0828  INR 1.2 1.2   Cardiac Enzymes: No results for input(s): CKTOTAL, CKMB, CKMBINDEX, TROPONINI in the last 168 hours. BNP (last 3 results) No results for input(s): PROBNP in the last 8760 hours. HbA1C: Recent Labs    09/18/19 1715  HGBA1C 5.4   CBG: Recent Labs  Lab 09/19/19 1855 09/20/19 0003  09/20/19 0612 09/20/19 0739 09/20/19 1102  GLUCAP 150* 122* 102* 88 122*   Lipid Profile: No results for input(s): CHOL, HDL, LDLCALC, TRIG, CHOLHDL, LDLDIRECT in the last 72 hours. Thyroid Function Tests: No results for input(s): TSH, T4TOTAL, FREET4, T3FREE, THYROIDAB in the last 72 hours. Anemia Panel: Recent Labs    09/18/19 1715 09/19/19 0828  FOLATE  --  15.4  FERRITIN  --  105  TIBC  --  418  IRON  --  50  RETICCTPCT 9.6*  --    Sepsis Labs: No results for input(s): PROCALCITON, LATICACIDVEN in the last 168 hours.  Recent Results (from the past 240 hour(s))  Respiratory Panel by RT PCR (Flu A&B, Covid) - Nasopharyngeal Swab     Status: None   Collection Time: 09/18/19  8:05 PM   Specimen:  Nasopharyngeal Swab  Result Value Ref Range Status   SARS Coronavirus 2 by RT PCR NEGATIVE NEGATIVE Final    Comment: (NOTE) SARS-CoV-2 target nucleic acids are NOT DETECTED. The SARS-CoV-2 RNA is generally detectable in upper respiratoy specimens during the acute phase of infection. The lowest concentration of SARS-CoV-2 viral copies this assay can detect is 131 copies/mL. A negative result does not preclude SARS-Cov-2 infection and should not be used as the sole basis for treatment or other patient management decisions. A negative result may occur with  improper specimen collection/handling, submission of specimen other than nasopharyngeal swab, presence of viral mutation(s) within the areas targeted by this assay, and inadequate number of viral copies (<131 copies/mL). A negative result must be combined with clinical observations, patient history, and epidemiological information. The expected result is Negative. Fact Sheet for Patients:  PinkCheek.be Fact Sheet for Healthcare Providers:  GravelBags.it This test is not yet ap proved or cleared by the [place] FDA and  has been authorized for detection and/or  diagnosis of SARS-CoV-2 by FDA under an Emergency Use Authorization (EUA). This EUA will remain  in effect (meaning this test can be used) for the duration of the COVID-19 declaration under Section 564(b)(1) of the Act, 21 U.S.C. section 360bbb-3(b)(1), unless the authorization is terminated or revoked sooner.    Influenza A by PCR NEGATIVE NEGATIVE Final   Influenza B by PCR NEGATIVE NEGATIVE Final    Comment: (NOTE) The Xpert Xpress SARS-CoV-2/FLU/RSV assay is intended as an aid in  the diagnosis of influenza from Nasopharyngeal swab specimens and  should not be used as a sole basis for treatment. Nasal washings and  aspirates are unacceptable for Xpert Xpress SARS-CoV-2/FLU/RSV  testing. Fact Sheet for Patients: PinkCheek.be Fact Sheet for Healthcare Providers: GravelBags.it This test is not yet approved or cleared by the [place] FDA and  has been authorized for detection and/or diagnosis of SARS-CoV-2 by  FDA under an Emergency Use Authorization (EUA). This EUA will remain  in effect (meaning this test can be used) for the duration of the  Covid-19 declaration under Section 564(b)(1) of the Act, 21  U.S.C. section 360bbb-3(b)(1), unless the authorization is  terminated or revoked. Performed at [facility], [address], [place], [place] [address]   MRSA PCR Screening     Status: None   Collection Time: 09/18/19  9:57 PM   Specimen: Nasopharyngeal  Result Value Ref Range Status   MRSA by PCR NEGATIVE NEGATIVE Final    Comment:        The GeneXpert MRSA Assay (FDA approved for NASAL specimens only), is one component of a comprehensive MRSA colonization surveillance program. It is not intended to diagnose MRSA infection nor to guide or monitor treatment for MRSA infections. Performed at [facility], [address], [place], [place] [address]          Radiology Studies: [facility] 1 View  Result  Date: 09/18/2019 CLINICAL DATA:  Shortness of breath EXAM: PORTABLE CHEST 1 VIEW COMPARISON:  05/31/2019 FINDINGS: Right dialysis catheter in place with the tip in the upper right atrium. No pneumothorax. Cardiomegaly with vascular congestion. No confluent opacities or effusions. No acute bony abnormality. IMPRESSION: Right dialysis catheter tip in the upper right atrium. Cardiomegaly, vascular congestion. Electronically Signed   By: Rolm Baptise M.D.   On: 09/18/2019 19:03        Scheduled Meds: . sodium chloride   Intravenous Once  . [START ON 09/25/2019] darbepoetin (ARANESP) injection -  NON-DIALYSIS  60 mcg Subcutaneous Q Mon-1800  . furosemide  80 mg Oral Daily  . insulin aspart  0-9 Units Subcutaneous Q6H  . insulin glargine  30 Units Subcutaneous QHS  . lidocaine  5 mL Mouth/Throat Once  . pantoprazole  40 mg Oral BID AC  . rifaximin  550 mg Oral BID  . sodium chloride flush  3 mL Intravenous Q12H   Continuous Infusions:   LOS: 0 days    Time spent: 35 minutes    Hrishikesh Hoeg Darleen Crocker, DO [facility]  If 7PM-7AM, please contact night-coverage [email or web address] 09/20/2019, 2:14 PM

## 2019-09-20 NOTE — Progress Notes (Signed)
Subjective:  No abdominal pain. No melena, brbpr. Last black stool one week ago. Feels weak.   Objective: Vital signs in last 24 hours: Temp:  [98 F (36.7 C)-98.8 F (37.1 C)] 98 F (36.7 C) (04/21 0400) Pulse Rate:  [92-100] 95 (04/21 0700) Resp:  [0-25] 12 (04/21 0500) BP: (129-205)/(42-136) 185/66 (04/21 0720) SpO2:  [81 %-100 %] 81 % (04/21 0700) Weight:  [119.2 kg] 119.2 kg (04/21 0500) Last BM Date: 09/19/19 General:   Alert,  Well-developed, well-nourished, pleasant and cooperative in NAD. Very talkative. Head:  Normocephalic and atraumatic. Eyes:  Sclera clear, no icterus.  Abdomen:  Soft, nontender and nondistended.  Normal bowel sounds, without guarding, and without rebound.   Extremities:  Without clubbing, deformity. 1+ edema bilaterally lower extremity. Neurologic:  Alert and  oriented x4;  grossly normal neurologically. Skin:  Intact without significant lesions or rashes. Psych:  Alert and cooperative. Normal mood and affect.  Intake/Output from previous day: 04/20 0701 - 04/21 0700 In: 6063 [P.O.:600; I.V.:303; Blood:320; IV Piggyback:100] Out: 350 [Urine:350] Intake/Output this shift: No intake/output data recorded.  Lab Results: CBC Recent Labs    09/18/19 1715 09/19/19 0828 09/20/19 0423  WBC 3.4* 3.9* 4.4  HGB 5.4* 7.0* 7.9*  HCT 18.7* 24.1* 26.6*  MCV 108.1* 102.1* 99.3  PLT 124* 127* 124*   BMET Recent Labs    09/18/19 1715 09/19/19 0828 09/20/19 0423  NA 132* 136 131*  K 3.8 3.8 3.8  CL 96* 97* 97*  CO2 23 26 21*  GLUCOSE 126* 91 91  BUN 22 27* 32*  CREATININE 2.75* 3.38* 3.90*  CALCIUM 8.1* 8.9 8.9   LFTs Recent Labs    09/18/19 1715 09/19/19 0828 09/20/19 0423  BILITOT 0.9 1.0 1.1  ALKPHOS 98 93 97  AST 42* 29 30  ALT 19 21 20   PROT 7.0 6.9 6.9  ALBUMIN 2.8* 2.8* 2.8*   No results for input(s): LIPASE in the last 72 hours. PT/INR Recent Labs    09/18/19 1715 09/19/19 0828  LABPROT 15.0 15.1  INR 1.2 1.2       Imaging Studies: CT Head Wo Contrast  Result Date: 09/08/2019 CLINICAL DATA:  Fall, head injury, bruise to forehead, neck pain EXAM: CT HEAD WITHOUT CONTRAST CT CERVICAL SPINE WITHOUT CONTRAST TECHNIQUE: Multidetector CT imaging of the head and cervical spine was performed following the standard protocol without intravenous contrast. Multiplanar CT image reconstructions of the cervical spine were also generated. COMPARISON:  None. FINDINGS: CT HEAD FINDINGS Brain: No evidence of acute infarction, hemorrhage, hydrocephalus, extra-axial collection or mass lesion/mass effect. Subcortical white matter and periventricular small vessel ischemic changes. Vascular: Intracranial atherosclerosis. Skull: Normal. Negative for fracture or focal lesion. Sinuses/Orbits: The visualized paranasal sinuses are essentially clear. The mastoid air cells are unopacified. Other: Very mild soft tissue swelling overlying the right frontal bone (series 2/image 16). CT CERVICAL SPINE FINDINGS Alignment: Normal cervical lordosis. Skull base and vertebrae: No acute fracture. No primary bone lesion or focal pathologic process. Soft tissues and spinal canal: No prevertebral fluid or swelling. No visible canal hematoma. Disc levels: Mild to moderate degenerative changes with bulky anterior osteophytosis along the anterior mid/lower cervical spine. Spinal canal is patent. Upper chest: Visualized lung apices are clear. Other: Visualized thyroid is unremarkable. IMPRESSION: Very mild soft tissue swelling overlying the right frontal bone. No evidence of calvarial fracture. No evidence of acute intracranial abnormality. Small vessel ischemic changes. No evidence of traumatic injury to the cervical spine. Mild to moderate degenerative  changes with bulky anterior osteophytosis. Electronically Signed   By: Julian Hy M.D.   On: 09/08/2019 18:06   CT Cervical Spine Wo Contrast  Result Date: 09/08/2019 CLINICAL DATA:  Fall, head injury, bruise  to forehead, neck pain EXAM: CT HEAD WITHOUT CONTRAST CT CERVICAL SPINE WITHOUT CONTRAST TECHNIQUE: Multidetector CT imaging of the head and cervical spine was performed following the standard protocol without intravenous contrast. Multiplanar CT image reconstructions of the cervical spine were also generated. COMPARISON:  None. FINDINGS: CT HEAD FINDINGS Brain: No evidence of acute infarction, hemorrhage, hydrocephalus, extra-axial collection or mass lesion/mass effect. Subcortical white matter and periventricular small vessel ischemic changes. Vascular: Intracranial atherosclerosis. Skull: Normal. Negative for fracture or focal lesion. Sinuses/Orbits: The visualized paranasal sinuses are essentially clear. The mastoid air cells are unopacified. Other: Very mild soft tissue swelling overlying the right frontal bone (series 2/image 16). CT CERVICAL SPINE FINDINGS Alignment: Normal cervical lordosis. Skull base and vertebrae: No acute fracture. No primary bone lesion or focal pathologic process. Soft tissues and spinal canal: No prevertebral fluid or swelling. No visible canal hematoma. Disc levels: Mild to moderate degenerative changes with bulky anterior osteophytosis along the anterior mid/lower cervical spine. Spinal canal is patent. Upper chest: Visualized lung apices are clear. Other: Visualized thyroid is unremarkable. IMPRESSION: Very mild soft tissue swelling overlying the right frontal bone. No evidence of calvarial fracture. No evidence of acute intracranial abnormality. Small vessel ischemic changes. No evidence of traumatic injury to the cervical spine. Mild to moderate degenerative changes with bulky anterior osteophytosis. Electronically Signed   By: Julian Hy M.D.   On: 09/08/2019 18:06   DG Chest Port 1 View  Result Date: 09/18/2019 CLINICAL DATA:  Shortness of breath EXAM: PORTABLE CHEST 1 VIEW COMPARISON:  05/31/2019 FINDINGS: Right dialysis catheter in place with the tip in the upper  right atrium. No pneumothorax. Cardiomegaly with vascular congestion. No confluent opacities or effusions. No acute bony abnormality. IMPRESSION: Right dialysis catheter tip in the upper right atrium. Cardiomegaly, vascular congestion. Electronically Signed   By: Rolm Baptise M.D.   On: 09/18/2019 19:03   DG Knee Complete 4 Views Right  Result Date: 09/08/2019 CLINICAL DATA:  74 year old male with fall and trauma to the right knee. EXAM: RIGHT KNEE - COMPLETE 4+ VIEW COMPARISON:  None. FINDINGS: There is no acute fracture or dislocation. The bones are osteopenic. There is mild to moderate osteoarthritic changes with tricompartmental narrowing, meniscal chondrocalcinosis, and bone spurring. No significant joint effusion. The soft tissues are grossly unremarkable. Vascular calcifications noted. IMPRESSION: 1. No acute fracture or dislocation. 2. Osteoarthritis. Electronically Signed   By: Anner Crete M.D.   On: 09/08/2019 17:58   US ABDOMEN LIMITED RUQ  Result Date: 09/07/2019 CLINICAL DATA:  Cirrhosis, ascites, hepatocellular carcinoma screening EXAM: ULTRASOUND ABDOMEN LIMITED RIGHT UPPER QUADRANT COMPARISON:  01/23/2019 FINDINGS: Gallbladder: Multiple shadowing gallstones in gallbladder up to 2.1 cm diameter. No gallbladder wall thickening, pericholecystic fluid or sonographic Murphy sign. Common bile duct: Diameter: 4 mm, normal Liver: Nodular liver with coarsened hepatic echogenicity consistent with history of alcoholic cirrhosis. No hepatic mass identified. Portal vein is patent on color Doppler imaging with normal direction of blood flow towards the liver. Other: Tiny amount of perihepatic free fluid noted. No significant ascites identified upon survey imaging of the 4 quadrants. IMPRESSION: Cholelithiasis. Cirrhotic appearing liver without mass. Minimal perihepatic ascites, insufficient for paracentesis. Electronically Signed   By: Lavonia Dana M.D.   On: 09/07/2019 10:38  [2  weeks]  Assessment: 74 year old male with history of EtOH cirrhosis (MELD Na 23 on dialysis, child Pugh B), history of transfusion dependent anemia, encephalopathy, refractory C. difficile improved on prolonged bank taper, presenting with acute on chronic anemia, heme positive stool, reports of melena.  Admitting hemoglobin 5.4, has received 2 units packed red blood cells this admission.  Recent hemoglobins in the 7/8 range.  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  Colonoscopy with portal colopathy and diffuse edema, changes of C. difficile colitis on colonoscopy.  He denies any NSAIDs or anticoagulation.  EGD yesterday showed grade 1 and grade 2 esophageal varices with no stigmata of recent bleeding, mild portal hypertensive gastropathy, single gastric polyp removed, moderate gastritis.  No obvious source for melena/transfusion dependency.  History of refractory C. difficile: Initial diagnosis December 2020 without improvement on bank, completing course of Dificid for 20 days, repeat C. difficile positive while in [place] in February.  Discharged on oral Vanco with 6-week taper back in February.  Patient should have completed taper at this point, apparently not taking as prescribed.  Should have completed taper around late March.  Hold on further vanc at this time given no diarrhea.   End-stage renal disease: On dialysis.  Plan: 1. Follow-up pathology 2. Agile study today 3. PPI BID.  4. F/u H/H, transfuse as needed.  5. Continue Xifaxan BID.  6. Restart clear liquids at 10am. NPO at 8pm in preparation for possible capsule endoscopy tomorrow if Agile capsule (dummy capsule) passes. Patient and nursing staff  aware.  Laureen Ochs. Bernarda Caffey [facility] [phone] 4/21/20219:37 AM     LOS: 0 days    '

## 2019-09-20 NOTE — Progress Notes (Signed)
Patient ID: BRANON SABINE, male   DOB: 10-08-1945, 74 y.o.   MRN: [id number] S: Feels well, no new complaints.  EGD performed 09/19/19 without identifying source of melena and for capsule endoscopy tomorrow if he passes the dummy capsule today. O:BP (!) 160/54   Pulse 95   Temp 98 F (36.7 C)   Resp 12   Ht 5\' 7"  (1.702 m)   Wt 119.2 kg   SpO2 97%   BMI 41.16 kg/m   Intake/Output Summary (Last 24 hours) at 09/20/2019 0908 Last data filed at 09/20/2019 0500 Gross per 24 hour  Intake 963 ml  Output 250 ml  Net 713 ml   Intake/Output: I/O last 3 completed shifts: In: 1691 [P.O.:600; I.V.:303; Blood:688; IV Piggyback:100] Out: 350 [Urine:350]  Intake/Output this shift:  No intake/output data recorded. Weight change: 2.2 kg Gen: NAD CVS: no rub Resp: cta Abd: +BS, soft, NT/ND Ext: 1+ edema, LUE BVT +T/B  Recent Labs  Lab 09/18/19 1715 09/19/19 0828 09/20/19 0423  NA 132* 136 131*  K 3.8 3.8 3.8  CL 96* 97* 97*  CO2 23 26 21*  GLUCOSE 126* 91 91  BUN 22 27* 32*  CREATININE 2.75* 3.38* 3.90*  ALBUMIN 2.8* 2.8* 2.8*  CALCIUM 8.1* 8.9 8.9  PHOS  --  6.1*  --   AST 42* 29 30  ALT 19 21 20    Liver Function Tests: Recent Labs  Lab 09/18/19 1715 09/19/19 0828 09/20/19 0423  AST 42* 29 30  ALT 19 21 20   ALKPHOS 98 93 97  BILITOT 0.9 1.0 1.1  PROT 7.0 6.9 6.9  ALBUMIN 2.8* 2.8* 2.8*   No results for input(s): LIPASE, AMYLASE in the last 168 hours. No results for input(s): AMMONIA in the last 168 hours. CBC: Recent Labs  Lab 09/18/19 1715 09/19/19 0828 09/20/19 0423  WBC 3.4* 3.9* 4.4  NEUTROABS 2.5  --  3.1  HGB 5.4* 7.0* 7.9*  HCT 18.7* 24.1* 26.6*  MCV 108.1* 102.1* 99.3  PLT 124* 127* 124*   Cardiac Enzymes: No results for input(s): CKTOTAL, CKMB, CKMBINDEX, TROPONINI in the last 168 hours. CBG: Recent Labs  Lab 09/19/19 1354 09/19/19 1855 09/20/19 0003 09/20/19 0612 09/20/19 0739  GLUCAP 134* 150* 122* 102* 88    Iron Studies:  Recent Labs     09/19/19 0828  IRON 50  TIBC 418  FERRITIN 105   Studies/Results: DG Chest Port 1 View  Result Date: 09/18/2019 CLINICAL DATA:  Shortness of breath EXAM: PORTABLE CHEST 1 VIEW COMPARISON:  05/31/2019 FINDINGS: Right dialysis catheter in place with the tip in the upper right atrium. No pneumothorax. Cardiomegaly with vascular congestion. No confluent opacities or effusions. No acute bony abnormality. IMPRESSION: Right dialysis catheter tip in the upper right atrium. Cardiomegaly, vascular congestion. Electronically Signed   By: Rolm Baptise M.D.   On: 09/18/2019 19:03   . sodium chloride   Intravenous Once  . darbepoetin (ARANESP) injection - NON-DIALYSIS  60 mcg Subcutaneous Q Tue-1800  . insulin aspart  0-9 Units Subcutaneous Q6H  . insulin glargine  30 Units Subcutaneous QHS  . lidocaine  5 mL Mouth/Throat Once  . pantoprazole  40 mg Oral BID AC  . rifaximin  550 mg Oral BID  . sodium chloride flush  3 mL Intravenous Q12H    BMET    Component Value Date/Time   NA 131 (L) 09/20/2019 0423   K 3.8 09/20/2019 0423   CL 97 (L) 09/20/2019 0423   CO2  21 (L) 09/20/2019 0423   GLUCOSE 91 09/20/2019 0423   BUN 32 (H) 09/20/2019 0423   CREATININE 3.90 (H) 09/20/2019 0423   CREATININE 2.40 (H) 09/07/2019 1047   CALCIUM 8.9 09/20/2019 0423   GFRNONAA 14 (L) 09/20/2019 0423   GFRNONAA 41 (L) 06/02/2018 1219   GFRAA 17 (L) 09/20/2019 0423   GFRAA 47 (L) 06/02/2018 1219   CBC    Component Value Date/Time   WBC 4.4 09/20/2019 0423   RBC 2.68 (L) 09/20/2019 0423   HGB 7.9 (L) 09/20/2019 0423   HCT 26.6 (L) 09/20/2019 0423   HCT 28 02/25/2012 1034   PLT 124 (L) 09/20/2019 0423   PLT 125 02/25/2012 1034   MCV 99.3 09/20/2019 0423   MCV 82.0 02/25/2012 1034   MCH 29.5 09/20/2019 0423   MCHC 29.7 (L) 09/20/2019 0423   RDW 22.2 (H) 09/20/2019 0423   LYMPHSABS 0.4 (L) 09/20/2019 0423   MONOABS 0.4 09/20/2019 0423   EOSABS 0.5 09/20/2019 0423   BASOSABS 0.0 09/20/2019 0423     Dialysis Orders: [facility] MWF . EDW 106kg HD Bath 2K/2.5Ca  Time 4 hrs Heparin none. Access RIJ TDC BFR 350 DFR 600    Epogen 10,000   Units IV/HD    Assessment/Plan: 1.  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   Pt has received 2 units PRBC's.  Continue to follow H/H and transfuse as needed.  Will continue with ESA. GI consulted and EGD performed 09/19/19 without finding source of melena.  For possible capsule endoscopy 09/21/19 if he passes the dummy capsule today. 2.  ESRD -  Plan for HD on MWF schedule for now.  He may need an extra session if his fluid status worsens.  3.  Hypertension/volume  - 13 kg above edw but has not reached his outpatient HD in some time.  Plan for HD today and UF as tolerated. 1. May need new edw but his outpatient unit was going to start him on 4 days per week due to large idwg 2. Discussed need for fluid restriction of 1200 ml/day   3. He was on lasix 80 mg daily to help with volume.  Will restart today and UF with HD as tolerated.  4.  Vascular access- using RIJ TDC but had first stage LUE BVT created on 08/17/19 by Dr. Donnetta Hutching and will require second stage 5.  Metabolic bone disease -   Continue with home meds and renal diet 6.  Nutrition -  Renal diet, carb modified but will likely be npo for GI workup 7. DM- per primary svc  Donetta Potts, MD [facility] [phone]

## 2019-09-21 ENCOUNTER — Encounter (HOSPITAL_COMMUNITY)
Admission: EM | Disposition: A | Discharge status: Home Health | Payer: Self-pay | Source: Home / Self Care | Attending: Internal Medicine

## 2019-09-21 ENCOUNTER — Inpatient Hospital Stay (HOSPITAL_COMMUNITY): Payer: Medicare Other

## 2019-09-21 ENCOUNTER — Other Ambulatory Visit: Payer: Self-pay

## 2019-09-21 DIAGNOSIS — K921 Melena: Secondary | ICD-10-CM

## 2019-09-21 DIAGNOSIS — D62 Acute posthemorrhagic anemia: Principal | ICD-10-CM

## 2019-09-21 DIAGNOSIS — K7031 Alcoholic cirrhosis of liver with ascites: Secondary | ICD-10-CM

## 2019-09-21 HISTORY — PX: GIVENS CAPSULE STUDY: SHX5432

## 2019-09-21 LAB — RENAL FUNCTION PANEL
Albumin: 2.7 g/dL — ABNORMAL LOW (ref 3.5–5.0)
Anion gap: 10 (ref 5–15)
BUN: 17 mg/dL (ref 8–23)
CO2: 28 mmol/L (ref 22–32)
Calcium: 8.5 mg/dL — ABNORMAL LOW (ref 8.9–10.3)
Chloride: 96 mmol/L — ABNORMAL LOW (ref 98–111)
Creatinine, Ser: 2.52 mg/dL — ABNORMAL HIGH (ref 0.61–1.24)
GFR calc Af Amer: 28 mL/min — ABNORMAL LOW (ref >60)
GFR calc non Af Amer: 24 mL/min — ABNORMAL LOW (ref >60)
Glucose, Bld: 75 mg/dL (ref 70–99)
Phosphorus: 4.5 mg/dL (ref 2.5–4.6)
Potassium: 3.5 mmol/L (ref 3.5–5.1)
Sodium: 134 mmol/L — ABNORMAL LOW (ref 135–145)

## 2019-09-21 LAB — BASIC METABOLIC PANEL
Anion gap: 11 (ref 5–15)
BUN: 17 mg/dL (ref 8–23)
CO2: 27 mmol/L (ref 22–32)
Calcium: 8.6 mg/dL — ABNORMAL LOW (ref 8.9–10.3)
Chloride: 96 mmol/L — ABNORMAL LOW (ref 98–111)
Creatinine, Ser: 2.52 mg/dL — ABNORMAL HIGH (ref 0.61–1.24)
GFR calc Af Amer: 28 mL/min — ABNORMAL LOW (ref >60)
GFR calc non Af Amer: 24 mL/min — ABNORMAL LOW (ref >60)
Glucose, Bld: 76 mg/dL (ref 70–99)
Potassium: 3.5 mmol/L (ref 3.5–5.1)
Sodium: 134 mmol/L — ABNORMAL LOW (ref 135–145)

## 2019-09-21 LAB — CBC
HCT: 28.1 % — ABNORMAL LOW (ref 39.0–52.0)
Hemoglobin: 8.2 g/dL — ABNORMAL LOW (ref 13.0–17.0)
MCH: 29.2 pg (ref 26.0–34.0)
MCHC: 29.2 g/dL — ABNORMAL LOW (ref 30.0–36.0)
MCV: 100 fL (ref 80.0–100.0)
Platelets: 109 10*3/uL — ABNORMAL LOW (ref 150–400)
RBC: 2.81 MIL/uL — ABNORMAL LOW (ref 4.22–5.81)
RDW: 21.3 % — ABNORMAL HIGH (ref 11.5–15.5)
WBC: 3.2 10*3/uL — ABNORMAL LOW (ref 4.0–10.5)
nRBC: 0 % (ref 0.0–0.2)

## 2019-09-21 LAB — GLUCOSE, CAPILLARY
Glucose-Capillary: 111 mg/dL — ABNORMAL HIGH (ref 70–99)
Glucose-Capillary: 125 mg/dL — ABNORMAL HIGH (ref 70–99)
Glucose-Capillary: 61 mg/dL — ABNORMAL LOW (ref 70–99)
Glucose-Capillary: 77 mg/dL (ref 70–99)
Glucose-Capillary: 86 mg/dL (ref 70–99)
Glucose-Capillary: 91 mg/dL (ref 70–99)

## 2019-09-21 LAB — SURGICAL PATHOLOGY

## 2019-09-21 SURGERY — IMAGING PROCEDURE, GI TRACT, INTRALUMINAL, VIA CAPSULE

## 2019-09-21 MED ORDER — LORAZEPAM 0.5 MG PO TABS
0.5000 mg | ORAL_TABLET | Freq: Once | ORAL | Status: AC
  Administered 2019-09-21: 0.5 mg via ORAL
  Filled 2019-09-21: qty 1

## 2019-09-21 MED ORDER — ORAL CARE MOUTH RINSE
15.0000 mL | Freq: Two times a day (BID) | OROMUCOSAL | Status: DC
  Administered 2019-09-21 – 2019-09-22 (×3): 15 mL via OROMUCOSAL

## 2019-09-21 MED ORDER — LORAZEPAM 2 MG/ML IJ SOLN
0.5000 mg | Freq: Once | INTRAMUSCULAR | Status: AC
  Administered 2019-09-21: 0.5 mg via INTRAVENOUS
  Filled 2019-09-21: qty 1

## 2019-09-21 NOTE — Progress Notes (Signed)
Subjective:  Thirsty. No other complaints.   Objective: Vital signs in last 24 hours: Temp:  [97.8 F (36.6 C)-98.8 F (37.1 C)] 98 F (36.7 C) (04/22 0400) Pulse Rate:  [89-103] 102 (04/22 0700) Resp:  [15-22] 18 (04/22 0700) BP: (109-184)/(49-85) 143/53 (04/22 0600) SpO2:  [72 %-100 %] 96 % (04/22 0700) Weight:  [117.4 kg-120.2 kg] 117.4 kg (04/22 0500) Last BM Date: 09/20/19 General:   Alert,  Well-developed, well-nourished, pleasant and cooperative in NAD Head:  Normocephalic and atraumatic. Eyes:  Sclera clear, no icterus.  Abdomen:  Soft, nontender and nondistended.  Normal bowel sounds, without guarding, and without rebound.   Extremities:  Without clubbing, deformity.  1+ edema bilaterally. Neurologic:  Alert and  oriented x4;  grossly normal neurologically. Psych:  Alert and cooperative. Normal mood and affect.  Intake/Output from previous day: 04/21 0701 - 04/22 0700 In: 443 [P.O.:240; I.V.:3] Out: 4300 [Urine:300] Intake/Output this shift: No intake/output data recorded.  Lab Results: CBC Recent Labs    09/19/19 0828 09/20/19 0423 09/21/19 0413  WBC 3.9* 4.4 3.2*  HGB 7.0* 7.9* 8.2*  HCT 24.1* 26.6* 28.1*  MCV 102.1* 99.3 100.0  PLT 127* 124* 109*   BMET Recent Labs    09/19/19 0828 09/20/19 0423 09/21/19 0413  NA 136 131* 134*  134*  K 3.8 3.8 3.5  3.5  CL 97* 97* 96*  96*  CO2 26 21* 27  28  GLUCOSE 91 91 76  75  BUN 27* 32* 17  17  CREATININE 3.38* 3.90* 2.52*  2.52*  CALCIUM 8.9 8.9 8.6*  8.5*   LFTs Recent Labs    09/18/19 1715 09/18/19 1715 09/19/19 0828 09/20/19 0423 09/21/19 0413  BILITOT 0.9  --  1.0 1.1  --   ALKPHOS 98  --  93 97  --   AST 42*  --  29 30  --   ALT 19  --  21 20  --   PROT 7.0  --  6.9 6.9  --   ALBUMIN 2.8*   < > 2.8* 2.8* 2.7*   < > = values in this interval not displayed.   No results for input(s): LIPASE in the last 72 hours. PT/INR Recent Labs    09/18/19 1715 09/19/19 0828  LABPROT  15.0 15.1  INR 1.2 1.2      Imaging Studies: CT Head Wo Contrast  Result Date: 09/08/2019 CLINICAL DATA:  Fall, head injury, bruise to forehead, neck pain EXAM: CT HEAD WITHOUT CONTRAST CT CERVICAL SPINE WITHOUT CONTRAST TECHNIQUE: Multidetector CT imaging of the head and cervical spine was performed following the standard protocol without intravenous contrast. Multiplanar CT image reconstructions of the cervical spine were also generated. COMPARISON:  None. FINDINGS: CT HEAD FINDINGS Brain: No evidence of acute infarction, hemorrhage, hydrocephalus, extra-axial collection or mass lesion/mass effect. Subcortical white matter and periventricular small vessel ischemic changes. Vascular: Intracranial atherosclerosis. Skull: Normal. Negative for fracture or focal lesion. Sinuses/Orbits: The visualized paranasal sinuses are essentially clear. The mastoid air cells are unopacified. Other: Very mild soft tissue swelling overlying the right frontal bone (series 2/image 16). CT CERVICAL SPINE FINDINGS Alignment: Normal cervical lordosis. Skull base and vertebrae: No acute fracture. No primary bone lesion or focal pathologic process. Soft tissues and spinal canal: No prevertebral fluid or swelling. No visible canal hematoma. Disc levels: Mild to moderate degenerative changes with bulky anterior osteophytosis along the anterior mid/lower cervical spine. Spinal canal is patent. Upper chest: Visualized lung apices are clear. Other:  Visualized thyroid is unremarkable. IMPRESSION: Very mild soft tissue swelling overlying the right frontal bone. No evidence of calvarial fracture. No evidence of acute intracranial abnormality. Small vessel ischemic changes. No evidence of traumatic injury to the cervical spine. Mild to moderate degenerative changes with bulky anterior osteophytosis. Electronically Signed   By: Julian Hy M.D.   On: 09/08/2019 18:06   CT Cervical Spine Wo Contrast  Result Date: 09/08/2019 CLINICAL  DATA:  Fall, head injury, bruise to forehead, neck pain EXAM: CT HEAD WITHOUT CONTRAST CT CERVICAL SPINE WITHOUT CONTRAST TECHNIQUE: Multidetector CT imaging of the head and cervical spine was performed following the standard protocol without intravenous contrast. Multiplanar CT image reconstructions of the cervical spine were also generated. COMPARISON:  None. FINDINGS: CT HEAD FINDINGS Brain: No evidence of acute infarction, hemorrhage, hydrocephalus, extra-axial collection or mass lesion/mass effect. Subcortical white matter and periventricular small vessel ischemic changes. Vascular: Intracranial atherosclerosis. Skull: Normal. Negative for fracture or focal lesion. Sinuses/Orbits: The visualized paranasal sinuses are essentially clear. The mastoid air cells are unopacified. Other: Very mild soft tissue swelling overlying the right frontal bone (series 2/image 16). CT CERVICAL SPINE FINDINGS Alignment: Normal cervical lordosis. Skull base and vertebrae: No acute fracture. No primary bone lesion or focal pathologic process. Soft tissues and spinal canal: No prevertebral fluid or swelling. No visible canal hematoma. Disc levels: Mild to moderate degenerative changes with bulky anterior osteophytosis along the anterior mid/lower cervical spine. Spinal canal is patent. Upper chest: Visualized lung apices are clear. Other: Visualized thyroid is unremarkable. IMPRESSION: Very mild soft tissue swelling overlying the right frontal bone. No evidence of calvarial fracture. No evidence of acute intracranial abnormality. Small vessel ischemic changes. No evidence of traumatic injury to the cervical spine. Mild to moderate degenerative changes with bulky anterior osteophytosis. Electronically Signed   By: Julian Hy M.D.   On: 09/08/2019 18:06   DG Chest Port 1 View  Result Date: 09/18/2019 CLINICAL DATA:  Shortness of breath EXAM: PORTABLE CHEST 1 VIEW COMPARISON:  05/31/2019 FINDINGS: Right dialysis catheter in  place with the tip in the upper right atrium. No pneumothorax. Cardiomegaly with vascular congestion. No confluent opacities or effusions. No acute bony abnormality. IMPRESSION: Right dialysis catheter tip in the upper right atrium. Cardiomegaly, vascular congestion. Electronically Signed   By: Rolm Baptise M.D.   On: 09/18/2019 19:03   DG Knee Complete 4 Views Right  Result Date: 09/08/2019 CLINICAL DATA:  74 year old male with fall and trauma to the right knee. EXAM: RIGHT KNEE - COMPLETE 4+ VIEW COMPARISON:  None. FINDINGS: There is no acute fracture or dislocation. The bones are osteopenic. There is mild to moderate osteoarthritic changes with tricompartmental narrowing, meniscal chondrocalcinosis, and bone spurring. No significant joint effusion. The soft tissues are grossly unremarkable. Vascular calcifications noted. IMPRESSION: 1. No acute fracture or dislocation. 2. Osteoarthritis. Electronically Signed   By: Anner Crete M.D.   On: 09/08/2019 17:58   US ABDOMEN LIMITED RUQ  Result Date: 09/07/2019 CLINICAL DATA:  Cirrhosis, ascites, hepatocellular carcinoma screening EXAM: ULTRASOUND ABDOMEN LIMITED RIGHT UPPER QUADRANT COMPARISON:  01/23/2019 FINDINGS: Gallbladder: Multiple shadowing gallstones in gallbladder up to 2.1 cm diameter. No gallbladder wall thickening, pericholecystic fluid or sonographic Murphy sign. Common bile duct: Diameter: 4 mm, normal Liver: Nodular liver with coarsened hepatic echogenicity consistent with history of alcoholic cirrhosis. No hepatic mass identified. Portal vein is patent on color Doppler imaging with normal direction of blood flow towards the liver. Other: Tiny amount of perihepatic free fluid  noted. No significant ascites identified upon survey imaging of the 4 quadrants. IMPRESSION: Cholelithiasis. Cirrhotic appearing liver without mass. Minimal perihepatic ascites, insufficient for paracentesis. Electronically Signed   By: Lavonia Dana M.D.   On: 09/07/2019  10:38  [2 weeks]  Physician: Janie Morning, MD  Procedure: Colonoscopy  Preprocedure Indication: Patient is a 74 year old white male with  end-stage liver disease and acute renal failure suggestive of hepatorenal  syndrome who has had ongoing melena and maroon stools. He is required 5  units of packed red cells in the past 5 days. EGD yesterday showed small  varices but no ulceration. Colonoscopy 3 years ago in [place]. The  results are unavailable. He reports ongoing diarrhea over the past few  months and did have a C. difficile diagnosis in late November at [facility].  His wife relates he was on Vancomycin most of the month with some  improvement in his diarrhea, but never resolution.  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   Findings: Immediately in the rectum there was adherent stool overlying inflammatory  tissue that was polypoid. These were not clear pseudomembranes but were  suggestive of this. The underlying mucosa some shallow ulcerations but  this is not an ulcerative process. There is diffuse edema as well. We  were able to advance to the distal descending colon, but at that point  preparation became poor stool circumferentially. This would wash and  suction and showed edematous mucosa that was very friable even to  suctioning. We were able to safely do a few biopsies. There did appear  to be some true polyps but we did not remove these for fear of bleeding. This procedure was performed with Dr. Karleen Dolphin, third-year GI fellow     Procedure Information   Procedure Details: The patient underwent monitored anesthesia care, which  was administered by an anesthesia professional. The patient's  respirations,  oxygen, level of consciousness, heart rate, ETCO2, ECG and  blood pressure were monitored throughout the procedure. A digital rectal  exam was performed. The scope was introduced through the anus and advanced  to the descending colon. The quality of bowel preparation was evaluated  using the [facility] Bowel Preparation Scale with scores of: right colon = 1,  transverse colon = not assessed, left colon = not assessed. The total BBPS  score was 1. Bowel prep was not adequate. The patient's estimated blood  loss was moderate (5+ mL). The procedure was difficult due to poor  preparation and severe colitis. In response to procedure difficulty, the  observation site was flushed. The patient tolerated the procedure well.  There were no apparent complications.   Scope Times: Insertion Time:  Withdrawal Time:     Nurse-administered sedation medications (excludes anesthesia  provider-administered medications) (Filter: Administrations occurring from  06/06/19 0926 to 06/06/19 0955) As of 06/06/19 0955    None      Specimens:  ID Type Source Tests Collected by Time  1 : r/o colitis Tissue Sigmoid Rectum Biopsy PATHOLOGY TISSUE REQUEST  Janie Morning, MD 06/06/2019 9:44 AM    Instrument/Scope:  CF [id number]  Other Result Information  This result has an attachment that is not available.  Result Narrative    Date of procedure: 06/02/2019 - 06/06/2019  Assessment: 74 year old male with history of ETOH cirrhosis (MELD Na when he 4 on dialysis, child Pugh B), history of transfusion dependent anemia, encephalopathy, refractory C. difficile improved on prolonged vanc taper, presenting with acute on chronic anemia, Hemoccult positive stools, reports of melena.  Admitting hemoglobin 5.4, received 2 units of packed red blood cells this admission.  Hemoglobin remained stable.  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  Colonoscopy with portal colopathy and diffuse edema, changes of severe C. difficile colitis, prep was poor.  Exam incomplete.  Some polyps left due to concern for bleeding.  Last complete colonoscopy in 2017.  Denies NSAIDs or anticoagulation.  EGD this admission showed grade 1 and grade 2 esophageal varices with no stigmata of recent bleeding, mild portal hypertensive gastropathy, single gastric polyp removed, moderate gastritis.  No obvious source for melena/transfusion dependency. Pathology showed chronic inactive gastritis with no H.pylori, hyperplastic polyp with globet cell metaplasia but no malignancy. Patency capsule completed this morning. KUB showed capsule to be in the lower mid pelvis, in rectum.  Plans for small bowel capsule study today.  History of refractory C. difficile: Initial diagnosis December 2020 without improvement on vanc, completing course of Dificid for 20 days, repeat C. difficile positive while in [place] in February.  Discharged on oral Vanc with 6-week taper back in February.  Patient should have completed taper at this point, apparently not taking as prescribed.  Should have completed taper around late March.  Hold on further vanc at this time given no diarrhea.   End-stage renal disease: On dialysis.   Plan: 1. Small bowel capsule today. Notified endo, Melanie, to proceed. Images will be available to read 09/22/19. 2. Continue PPI twice daily. 3. Continue Xifaxan 550 mg twice daily.  Laureen Ochs. Bernarda Caffey [facility] [phone] 4/22/202111:25 AM     LOS: 1 day

## 2019-09-21 NOTE — Progress Notes (Signed)
PROGRESS NOTE    Jeffrey HASKETT  TML:[id number] DOB: Jun 13, 1945 DOA: 09/18/2019 PCP: Monico Blitz, MD   Brief Narrative:  74 y.o.malewith medical history significant forend-stage renal disease onM/W/Fhemodialysis,chronic anemia with baseline hemoglobin 6.8-1.2, alcoholic cirrhosis, type 2 diabetes mellitus,who is admitted to [facility] on4/19/2021with acute on chronic anemiaafter presenting from home to [facility] Emergency Department at the direction of hemodialysis center for evaluation of low hemoglobin.  4/21: Plan to transfer to telemetry today.  No acute bleeding noted.  Patient given agile capsule with plans for capsule endoscopy in a.m. if capsule passes.  Plan for hemodialysis as well today.  4/22: Patient had undergone hemodialysis yesterday with 4 L removed and no complications.  Hemoglobin remained stable at 8.2 this morning.  Small capsule endoscopy initiated per GI.  Assessment & Plan:   Principal Problem:   Acute on chronic anemia Active Problems:   Cirrhosis (HCC)   Generalized weakness   DM2 (diabetes mellitus, type 2) (HCC)   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HCC)   Gastrointestinal hemorrhage   Acute blood loss anemia   1. Acute on chronic blood loss anemia-patient reports having black stool and Hemoccult positive in the ED. Hemoglobin down to 5.4.  Hemoglobin is currently at 8.2 and stable with no acute bleeding.  EGD performed 4/20 with findings of likely friable mucosa related to portal hypertension and no active bleeding.    Capsule endoscopy per GI today with results to be obtained 4/23. 2. Dyspnea-likely was secondary to anemia. Is feeling better after transfusion. 3. Generalized weakness-likely secondary to acute on chronic anemia. When medically stabilized we will ask for PT evaluation. 4. End-stage renal disease on hemodialysis Monday Wednesday Friday that she has been seen by nephrology with plans for hemodialysis tomorrow. 5. 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 He is followed closely by his GI doctor work. Follow PT/INR. 6. History of C. difficile colitis-patient has had severe recalcitrant disease and has been on a 6-week course of oral vancomycin tapered. He is not having any abdominal symptoms at this time. He had received a dose of ceftriaxone in the ED for SBP prophylaxis 7. Type 2 diabetes mellitus-poorly controlled as evidenced by an A1c of 8.2%. He has been restarted on basal and bolus insulin with supplemental sliding scale coverage. Follow closely and adjust as needed.   DVT prophylaxis:SCDs Code Status: Full code Family Communication: Plan to call wife for update Disposition Plan:  Capsule endoscopy per GI today.  Transfer to telemetry when bed available.  Plans for hemodialysis once again tomorrow and then anticipated discharge.   Consultants:   GI  Nephrology  Procedures:   EGD 4/20  Antimicrobials:  Anti-infectives (From admission, onward)   Start     Dose/Rate Route Frequency Ordered Stop   09/18/19 2200  rifaximin (XIFAXAN) tablet 550 mg     550 mg Oral 2 times daily 09/18/19 2049     09/18/19 2115  cefTRIAXone (ROCEPHIN) 1 g in sodium chloride 0.9 % 100 mL IVPB  Status:  Discontinued     1 g 200 mL/hr over 30 Minutes Intravenous Every 24 hours 09/18/19 2051 09/19/19 1044       Subjective: Patient seen and evaluated today with no new acute complaints or concerns. No acute concerns or events noted overnight.  Objective: Vitals:   09/21/19 1100 09/21/19 1137 09/21/19 1200 09/21/19 1300  BP:   (!) 178/73   Pulse: (!) 101  (!) 102 (!) 102  Resp: 20  (!) 24 17  Temp:      TempSrc:      SpO2: 95%  96% 99%  Weight:  117.4 kg    Height:  5\' 7"  (1.702 m)      Intake/Output Summary (Last 24 hours) at 09/21/2019 1528 Last data filed at 09/20/2019 2050 Gross per 24 hour  Intake 440 ml  Output 4000 ml  Net -3560 ml   Filed Weights   09/20/19 1645  09/21/19 0500 09/21/19 1137  Weight: 120.2 kg 117.4 kg 117.4 kg    Examination:  General exam: Appears calm and comfortable  Respiratory system: Clear to auscultation. Respiratory effort normal. Cardiovascular system: S1 & S2 heard, RRR. No JVD, murmurs, rubs, gallops or clicks. No pedal edema. Gastrointestinal system: Abdomen is nondistended, soft and nontender. No organomegaly or masses felt. Normal bowel sounds heard. Central nervous system: Alert and oriented. No focal neurological deficits. Extremities: Symmetric 5 x 5 power. Skin: No rashes, lesions or ulcers Psychiatry: Judgement and insight appear normal. Mood & affect appropriate.     Data Reviewed: I have personally reviewed following labs and imaging studies  CBC: Recent Labs  Lab 09/18/19 1715 09/19/19 0828 09/20/19 0423 09/21/19 0413  WBC 3.4* 3.9* 4.4 3.2*  NEUTROABS 2.5  --  3.1  --   HGB 5.4* 7.0* 7.9* 8.2*  HCT 18.7* 24.1* 26.6* 28.1*  MCV 108.1* 102.1* 99.3 100.0  PLT 124* 127* 124* 494*   Basic Metabolic Panel: Recent Labs  Lab 09/18/19 1715 09/19/19 0828 09/20/19 0423 09/21/19 0413  NA 132* 136 131* 134*  134*  K 3.8 3.8 3.8 3.5  3.5  CL 96* 97* 97* 96*  96*  CO2 23 26 21* 27  28  GLUCOSE 126* 91 91 76  75  BUN 22 27* 32* 17  17  CREATININE 2.75* 3.38* 3.90* 2.52*  2.52*  CALCIUM 8.1* 8.9 8.9 8.6*  8.5*  MG 2.1 2.1 2.2  --   PHOS  --  6.1*  --  4.5   GFR: Estimated Creatinine Clearance: 32.5 mL/min (A) (by C-G formula based on SCr of 2.52 mg/dL (H)). Liver Function Tests: Recent Labs  Lab 09/18/19 1715 09/19/19 0828 09/20/19 0423 09/21/19 0413  AST 42* 29 30  --   ALT 19 21 20   --   ALKPHOS 98 93 97  --   BILITOT 0.9 1.0 1.1  --   PROT 7.0 6.9 6.9  --   ALBUMIN 2.8* 2.8* 2.8* 2.7*   No results for input(s): LIPASE, AMYLASE in the last 168 hours. No results for input(s): AMMONIA in the last 168 hours. Coagulation Profile: Recent Labs  Lab 09/18/19 1715 09/19/19 0828    INR 1.2 1.2   Cardiac Enzymes: No results for input(s): CKTOTAL, CKMB, CKMBINDEX, TROPONINI in the last 168 hours. BNP (last 3 results) No results for input(s): PROBNP in the last 8760 hours. HbA1C: Recent Labs    09/18/19 1715  HGBA1C 5.4   CBG: Recent Labs  Lab 09/20/19 2142 09/21/19 0047 09/21/19 0704 09/21/19 1324 09/21/19 1434  GLUCAP 93 77 86 61* 91   Lipid Profile: No results for input(s): CHOL, HDL, LDLCALC, TRIG, CHOLHDL, LDLDIRECT in the last 72 hours. Thyroid Function Tests: No results for input(s): TSH, T4TOTAL, FREET4, T3FREE, THYROIDAB in the last 72 hours. Anemia Panel: Recent Labs    09/18/19 1715 09/19/19 0828  FOLATE  --  15.4  FERRITIN  --  105  TIBC  --  418  IRON  --  50  RETICCTPCT 9.6*  --    Sepsis Labs: No results for input(s): PROCALCITON, LATICACIDVEN in the last 168 hours.  Recent Results (from the past 240 hour(s))  Respiratory Panel by RT PCR (Flu A&B, Covid) - Nasopharyngeal Swab     Status: None   Collection Time: 09/18/19  8:05 PM   Specimen: Nasopharyngeal Swab  Result Value Ref Range Status   SARS Coronavirus 2 by RT PCR NEGATIVE NEGATIVE Final    Comment: (NOTE) SARS-CoV-2 target nucleic acids are NOT DETECTED. The SARS-CoV-2 RNA is generally detectable in upper respiratoy specimens during the acute phase of infection. The lowest concentration of SARS-CoV-2 viral copies this assay can detect is 131 copies/mL. A negative result does not preclude SARS-Cov-2 infection and should not be used as the sole basis for treatment or other patient management decisions. A negative result may occur with  improper specimen collection/handling, submission of specimen other than nasopharyngeal swab, presence of viral mutation(s) within the areas targeted by this assay, and inadequate number of viral copies (<131 copies/mL). A negative result must be combined with clinical observations, patient history, and epidemiological information.  The expected result is Negative. Fact Sheet for Patients:  PinkCheek.be Fact Sheet for Healthcare Providers:  GravelBags.it This test is not yet ap proved or cleared by the [place] FDA and  has been authorized for detection and/or diagnosis of SARS-CoV-2 by FDA under an Emergency Use Authorization (EUA). This EUA will remain  in effect (meaning this test can be used) for the duration of the COVID-19 declaration under Section 564(b)(1) of the Act, 21 U.S.C. section 360bbb-3(b)(1), unless the authorization is terminated or revoked sooner.    Influenza A by PCR NEGATIVE NEGATIVE Final   Influenza B by PCR NEGATIVE NEGATIVE Final    Comment: (NOTE) The Xpert Xpress SARS-CoV-2/FLU/RSV assay is intended as an aid in  the diagnosis of influenza from Nasopharyngeal swab specimens and  should not be used as a sole basis for treatment. Nasal washings and  aspirates are unacceptable for Xpert Xpress SARS-CoV-2/FLU/RSV  testing. Fact Sheet for Patients: PinkCheek.be Fact Sheet for Healthcare Providers: GravelBags.it This test is not yet approved or cleared by the [place] FDA and  has been authorized for detection and/or diagnosis of SARS-CoV-2 by  FDA under an Emergency Use Authorization (EUA). This EUA will remain  in effect (meaning this test can be used) for the duration of the  Covid-19 declaration under Section 564(b)(1) of the Act, 21  U.S.C. section 360bbb-3(b)(1), unless the authorization is  terminated or revoked. Performed at [facility], [address] [place], [place] [address]   MRSA PCR Screening     Status: None   Collection Time: 09/18/19  9:57 PM   Specimen: Nasopharyngeal  Result Value Ref Range Status   MRSA by PCR NEGATIVE NEGATIVE Final    Comment:        The GeneXpert MRSA Assay (FDA approved for NASAL specimens only), is one component of  a comprehensive MRSA colonization surveillance program. It is not intended to diagnose MRSA infection nor to guide or monitor treatment for MRSA infections. Performed at [facility], [address] [place], [place] [address]          Radiology Studies: DG Abd 1 View  Result Date: 09/21/2019 CLINICAL DATA:  Rectal bleeding. Capsule study. EXAM: ABDOMEN - 1 VIEW COMPARISON:  Abdominal radiograph dated 05/27/2016 FINDINGS: The capsule is in the lower mid pelvis, likely in the rectum. Bowel gas pattern is normal.  No visible free air or free fluid on the supine images. No acute bone abnormality. IMPRESSION: 1. The capsule is in the rectum. 2. Otherwise, benign appearing abdomen. Electronically Signed   By: Lorriane Shire M.D.   On: 09/21/2019 10:52        Scheduled Meds: . sodium chloride   Intravenous Once  . [START ON 09/25/2019] darbepoetin (ARANESP) injection - NON-DIALYSIS  60 mcg Subcutaneous Q Mon-1800  . furosemide  80 mg Oral Daily  . insulin aspart  0-9 Units Subcutaneous Q6H  . insulin glargine  30 Units Subcutaneous QHS  . lidocaine  5 mL Mouth/Throat Once  . mouth rinse  15 mL Mouth Rinse BID  . pantoprazole  40 mg Oral BID AC  . rifaximin  550 mg Oral BID  . sodium chloride flush  3 mL Intravenous Q12H   Continuous Infusions: . sodium chloride    . sodium chloride       LOS: 1 day    Time spent: 30 minutes    Makenley Shimp Darleen Crocker, DO [facility]  If 7PM-7AM, please contact night-coverage [email or web address] 09/21/2019, 3:28 PM

## 2019-09-21 NOTE — Progress Notes (Addendum)
Hypoglycemic Event  CBG: 61  Treatment: pt unable to eat until 2 p.m. due to Givens study. Will receive lunch tray at 2 p.m.  Symptoms: nonsymptomatic  Follow-up CBG: Time: 1434 CBG Result: 91  Possible Reasons for Event: Pt NPO for Givens study   Comments/MD notified: pt not symptomatic, will receive clear liquids lunch tray at 2 p.m. Will recheck BG after patient has had lunch tray. [name] notified    Tereasa Coop

## 2019-09-21 NOTE — Progress Notes (Signed)
Patient ID: BOSCO PAPARELLA, male   DOB: 1945/10/26, 74 y.o.   MRN: [id number]   S: no complaints.  O:BP (!) 143/53   Pulse (!) 102   Temp 98 F (36.7 C) (Oral)   Resp 18   Ht 5\' 7"  (1.702 m)   Wt 117.4 kg   SpO2 96%   BMI 40.54 kg/m   Intake/Output Summary (Last 24 hours) at 09/21/2019 1025 Last data filed at 09/20/2019 2050 Gross per 24 hour  Intake 440 ml  Output 4300 ml  Net -3860 ml   Intake/Output: I/O last 3 completed shifts: In: 443 [P.O.:240; I.V.:3; Other:200] Out: 7106 [Urine:450; Other:4000]  Intake/Output this shift:  No intake/output data recorded. Weight change: 1 kg   Gen: NAD CVS: no rub Resp: cta Abd: +BS, soft, NT/ND Ext: 1+ edema, LUE BVT +T/B, R IJ [facility]  Recent Labs  Lab 09/18/19 1715 09/19/19 0828 09/20/19 0423 09/21/19 0413  NA 132* 136 131* 134*  134*  K 3.8 3.8 3.8 3.5  3.5  CL 96* 97* 97* 96*  96*  CO2 23 26 21* 27  28  GLUCOSE 126* 91 91 76  75  BUN 22 27* 32* 17  17  CREATININE 2.75* 3.38* 3.90* 2.52*  2.52*  ALBUMIN 2.8* 2.8* 2.8* 2.7*  CALCIUM 8.1* 8.9 8.9 8.6*  8.5*  PHOS  --  6.1*  --  4.5  AST 42* 29 30  --   ALT 19 21 20   --    Liver Function Tests: Recent Labs  Lab 09/18/19 1715 09/18/19 1715 09/19/19 0828 09/20/19 0423 09/21/19 0413  AST 42*  --  29 30  --   ALT 19  --  21 20  --   ALKPHOS 98  --  93 97  --   BILITOT 0.9  --  1.0 1.1  --   PROT 7.0  --  6.9 6.9  --   ALBUMIN 2.8*   < > 2.8* 2.8* 2.7*   < > = values in this interval not displayed.   No results for input(s): LIPASE, AMYLASE in the last 168 hours. No results for input(s): AMMONIA in the last 168 hours. CBC: Recent Labs  Lab 09/18/19 1715 09/18/19 1715 09/19/19 0828 09/20/19 0423 09/21/19 0413  WBC 3.4*   < > 3.9* 4.4 3.2*  NEUTROABS 2.5  --   --  3.1  --   HGB 5.4*   < > 7.0* 7.9* 8.2*  HCT 18.7*   < > 24.1* 26.6* 28.1*  MCV 108.1*  --  102.1* 99.3 100.0  PLT 124*   < > 127* 124* 109*   < > = values in this interval not displayed.    Cardiac Enzymes: No results for input(s): CKTOTAL, CKMB, CKMBINDEX, TROPONINI in the last 168 hours. CBG: Recent Labs  Lab 09/20/19 1102 09/20/19 1721 09/20/19 2142 09/21/19 0047 09/21/19 0704  GLUCAP 122* 115* 93 77 86    Iron Studies:  Recent Labs    09/19/19 0828  IRON 50  TIBC 418  FERRITIN 105   Studies/Results: No results found. . sodium chloride   Intravenous Once  . [START ON 09/25/2019] darbepoetin (ARANESP) injection - NON-DIALYSIS  60 mcg Subcutaneous Q Mon-1800  . furosemide  80 mg Oral Daily  . insulin aspart  0-9 Units Subcutaneous Q6H  . insulin glargine  30 Units Subcutaneous QHS  . lidocaine  5 mL Mouth/Throat Once  . mouth rinse  15 mL Mouth Rinse BID  . pantoprazole  40 mg Oral BID AC  . rifaximin  550 mg Oral BID  . sodium chloride flush  3 mL Intravenous Q12H    BMET    Component Value Date/Time   NA 134 (L) 09/21/2019 0413   NA 134 (L) 09/21/2019 0413   K 3.5 09/21/2019 0413   K 3.5 09/21/2019 0413   CL 96 (L) 09/21/2019 0413   CL 96 (L) 09/21/2019 0413   CO2 28 09/21/2019 0413   CO2 27 09/21/2019 0413   GLUCOSE 75 09/21/2019 0413   GLUCOSE 76 09/21/2019 0413   BUN 17 09/21/2019 0413   BUN 17 09/21/2019 0413   CREATININE 2.52 (H) 09/21/2019 0413   CREATININE 2.52 (H) 09/21/2019 0413   CREATININE 2.40 (H) 09/07/2019 1047   CALCIUM 8.5 (L) 09/21/2019 0413   CALCIUM 8.6 (L) 09/21/2019 0413   GFRNONAA 24 (L) 09/21/2019 0413   GFRNONAA 24 (L) 09/21/2019 0413   GFRNONAA 41 (L) 06/02/2018 1219   GFRAA 28 (L) 09/21/2019 0413   GFRAA 28 (L) 09/21/2019 0413   GFRAA 47 (L) 06/02/2018 1219   CBC    Component Value Date/Time   WBC 3.2 (L) 09/21/2019 0413   RBC 2.81 (L) 09/21/2019 0413   HGB 8.2 (L) 09/21/2019 0413   HCT 28.1 (L) 09/21/2019 0413   HCT 28 02/25/2012 1034   PLT 109 (L) 09/21/2019 0413   PLT 125 02/25/2012 1034   MCV 100.0 09/21/2019 0413   MCV 82.0 02/25/2012 1034   MCH 29.2 09/21/2019 0413   MCHC 29.2 (L) 09/21/2019  0413   RDW 21.3 (H) 09/21/2019 0413   LYMPHSABS 0.4 (L) 09/20/2019 0423   MONOABS 0.4 09/20/2019 0423   EOSABS 0.5 09/20/2019 0423   BASOSABS 0.0 09/20/2019 0423    Dialysis Orders: [facility]  on MWF . EDW 106kg HD Bath 2K/2.5Ca  Time 4 hrs Heparin none. Access RIJ TDC BFR 350 DFR 600    Epogen 10,000   Units IV/HD    Assessment/Plan: 1.  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   Pt has received 2 units PRBC's.  Continue to follow H/H and transfuse as needed.  Will continue with ESA. GI consulted and EGD performed 09/19/19 without finding source of melena.  For possible capsule endoscopy 09/21/19 if he passes the dummy capsule . 2.  ESRD -  Plan for HD on MWF schedule for now.  He may need an extra session if his fluid status worsens.  HD tomorrow.  3.  Hypertension/volume  - 13 kg above edw but has not reached his outpatient HD in some time.  Plan for HD today and UF as tolerated. 1. May need new edw but his outpatient unit was going to start him on 4 days per week due to large idwg 2. Discussed need for fluid restriction of 1200 ml/day   3. He was on lasix 80 mg daily to help with volume.  Will restart today and UF with HD as tolerated.  4.  Vascular access- using RIJ TDC but had first stage LUE BVT created on 08/17/19 by Dr. Donnetta Hutching and will require second stage 5.  Metabolic bone disease -   Continue with home meds and renal diet 6.  Nutrition -  Renal diet, carb modified but will likely be npo for GI workup 7. DM- per primary svc  Madelon Lips MD  [place] pgr [phone]

## 2019-09-22 ENCOUNTER — Telehealth: Payer: Self-pay | Admitting: Nurse Practitioner

## 2019-09-22 LAB — TYPE AND SCREEN
ABO/RH(D): O POS
Antibody Screen: NEGATIVE
Unit division: 0
Unit division: 0
Unit division: 0
Unit division: 0

## 2019-09-22 LAB — BPAM RBC
Blood Product Expiration Date: 202105192359
Blood Product Expiration Date: 202105202359
Blood Product Expiration Date: 202105222359
Blood Product Expiration Date: 202105222359
ISSUE DATE / TIME: 202104192006
ISSUE DATE / TIME: 202104200223
ISSUE DATE / TIME: 202104201133
Unit Type and Rh: 5100
Unit Type and Rh: 5100
Unit Type and Rh: 5100
Unit Type and Rh: 5100

## 2019-09-22 LAB — BASIC METABOLIC PANEL
Anion gap: 12 (ref 5–15)
BUN: 22 mg/dL (ref 8–23)
CO2: 25 mmol/L (ref 22–32)
Calcium: 8.6 mg/dL — ABNORMAL LOW (ref 8.9–10.3)
Chloride: 95 mmol/L — ABNORMAL LOW (ref 98–111)
Creatinine, Ser: 3.21 mg/dL — ABNORMAL HIGH (ref 0.61–1.24)
GFR calc Af Amer: 21 mL/min — ABNORMAL LOW (ref >60)
GFR calc non Af Amer: 18 mL/min — ABNORMAL LOW (ref >60)
Glucose, Bld: 110 mg/dL — ABNORMAL HIGH (ref 70–99)
Potassium: 3.9 mmol/L (ref 3.5–5.1)
Sodium: 132 mmol/L — ABNORMAL LOW (ref 135–145)

## 2019-09-22 LAB — CBC
HCT: 28.2 % — ABNORMAL LOW (ref 39.0–52.0)
Hemoglobin: 8.4 g/dL — ABNORMAL LOW (ref 13.0–17.0)
MCH: 29.4 pg (ref 26.0–34.0)
MCHC: 29.8 g/dL — ABNORMAL LOW (ref 30.0–36.0)
MCV: 98.6 fL (ref 80.0–100.0)
Platelets: 113 10*3/uL — ABNORMAL LOW (ref 150–400)
RBC: 2.86 MIL/uL — ABNORMAL LOW (ref 4.22–5.81)
RDW: 20.5 % — ABNORMAL HIGH (ref 11.5–15.5)
WBC: 3.6 10*3/uL — ABNORMAL LOW (ref 4.0–10.5)
nRBC: 0 % (ref 0.0–0.2)

## 2019-09-22 LAB — GLUCOSE, CAPILLARY
Glucose-Capillary: 125 mg/dL — ABNORMAL HIGH (ref 70–99)
Glucose-Capillary: 124 mg/dL — ABNORMAL HIGH (ref 70–99)
Glucose-Capillary: 143 mg/dL — ABNORMAL HIGH (ref 70–99)

## 2019-09-22 LAB — MAGNESIUM: Magnesium: 1.7 mg/dL (ref 1.7–2.4)

## 2019-09-22 NOTE — Progress Notes (Signed)
Subjective: Feels good today. Denies abdominal pain, N/V. Had a BM this morning without blood or melena (confirmed by CNA). No overt GI complaints. He states he's supposed to get HD today.  Objective: Vital signs in last 24 hours: Temp:  [97.7 F (36.5 C)-98.6 F (37 C)] 97.7 F (36.5 C) (04/23 0800) Pulse Rate:  [99-112] 109 (04/23 1000) Resp:  [9-32] 20 (04/23 1000) BP: (147-178)/(53-73) 147/53 (04/22 2055) SpO2:  [87 %-99 %] 91 % (04/23 1000) Weight:  [117.4 kg-117.8 kg] 117.8 kg (04/23 0500) Last BM Date: 09/21/19 General:   Alert and oriented, pleasant Head:  Normocephalic and atraumatic. Eyes:  No icterus, sclera clear. Conjuctiva pink.  Heart:  S1, S2 present, no murmurs noted.  Lungs: Clear to auscultation bilaterally, without wheezing, rales, or rhonchi.  Abdomen:  Bowel sounds present, soft, non-tender. Abdomen distended by no obvious ascites. No HSM or hernias noted. No rebound or guarding. No masses appreciated  Msk:  Symmetrical without gross deformities. Extremities:  Without clubbing or edema. Neurologic:  Alert and  oriented x4;  grossly normal neurologically. Skin:  Warm and dry, intact without significant lesions.  Psych:  Alert and cooperative. Normal mood and affect.  Intake/Output from previous day: 04/22 0701 - 04/23 0700 In: -  Out: 700 [Urine:700] Intake/Output this shift: No intake/output data recorded.  Lab Results: Recent Labs    09/20/19 0423 09/21/19 0413 09/22/19 0509  WBC 4.4 3.2* 3.6*  HGB 7.9* 8.2* 8.4*  HCT 26.6* 28.1* 28.2*  PLT 124* 109* 113*   BMET Recent Labs    09/20/19 0423 09/21/19 0413 09/22/19 0509  NA 131* 134*  134* 132*  K 3.8 3.5  3.5 3.9  CL 97* 96*  96* 95*  CO2 21* 27  28 25   GLUCOSE 91 76  75 110*  BUN 32* 17  17 22   CREATININE 3.90* 2.52*  2.52* 3.21*  CALCIUM 8.9 8.6*  8.5* 8.6*   LFT Recent Labs    09/20/19 0423 09/21/19 0413  PROT 6.9  --   ALBUMIN 2.8* 2.7*  AST 30  --   ALT 20   --   ALKPHOS 97  --   BILITOT 1.1  --    PT/INR No results for input(s): LABPROT, INR in the last 72 hours. Hepatitis Panel Recent Labs    09/20/19 0423  HEPBSAG NON REACTIVE     Studies/Results: DG Abd 1 View  Result Date: 09/21/2019 CLINICAL DATA:  Rectal bleeding. Capsule study. EXAM: ABDOMEN - 1 VIEW COMPARISON:  Abdominal radiograph dated 05/27/2016 FINDINGS: The capsule is in the lower mid pelvis, likely in the rectum. Bowel gas pattern is normal. No visible free air or free fluid on the supine images. No acute bone abnormality. IMPRESSION: 1. The capsule is in the rectum. 2. Otherwise, benign appearing abdomen. Electronically Signed   By: Lorriane Shire M.D.   On: 09/21/2019 10:52    Assessment: 74 year old male with history of ETOH cirrhosis (MELD Na 23 on dialysis, child Pugh B), history of transfusion dependent anemia, encephalopathy, refractory C. difficile improved on prolonged vanc taper, presenting with acute on chronic anemia, Hemoccult positive stools, reports of melena.  Admitting hemoglobin 5.4, received 2 units of packed red blood cells this admission.  Hemoglobin remained stable.  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  Colonoscopy with portal colopathy and diffuse edema, changes of severe C. difficile colitis, prep was poor.  Exam incomplete.  Some polyps left due to concern for bleeding.  Last complete colonoscopy in 2017.  Denies NSAIDs or anticoagulation.  EGD this admission showed grade 1 and grade 2 esophageal varices with no stigmata of recent bleeding, mild portal hypertensive gastropathy, single gastric polyp removed, moderate gastritis.  No obvious source for  melena/transfusion dependency. Pathology showed chronic inactive gastritis with no H.pylori, hyperplastic polyp with globet cell metaplasia but no malignancy. Patency capsule completed this morning. KUB showed capsule to be in the lower mid pelvis, in rectum.  Small bowel capsule completed yesterday with plans to read today. Hgb today stable at 8.4  History of refractory C. Difficile-Initial diagnosis December 2020 without improvement on vanc, completing course of Dificid for 20 days, repeat C. difficile positive while in [place] in February. Discharged on oral Vanc with 6-week taper back in February. Patient should have completed taper at this point, apparently not taking as prescribed. Should have completed taper around late March. Hold on further vancat this time given no diarrhea.  End-stage renal disease- On dialysis. Nephrology following   Plan: 1. Read small bowel capsule today 2. Continued bid PPI 3. Continued Xifaxan 4. Monitor for recurrent acute GI bleed 5. Monitor hgb 6. Transfuse as necessary 7. Further recommendations to follow   Thank you for allowing [place] to participate in the care of Jeffrey T Ephriam Jenkins, DNP, AGNP-C Adult & Gerontological Nurse Practitioner [facility]    LOS: 2 days    09/22/2019, 11:02 AM

## 2019-09-22 NOTE — Discharge Planning (Signed)
Pt was given discharge information and it was went over by this RN with him and his wife. The both demonstrated and verbalized understanding.

## 2019-09-22 NOTE — Progress Notes (Signed)
Pt had bowel movement and expelled Given's capsule @0400  4.23.21

## 2019-09-22 NOTE — Op Note (Addendum)
Small Bowel Givens Capsule Study Procedure date:  09/21/19 (read 09/23/19)  Referring Provider:  Dr. Gala Romney PCP:  Dr. Monico Blitz, MD  Indication for procedure:  Recurrent acute on chronic anemia  Patient data:  Wt: 114.3 kg Ht: 5\' 7"  (1.702 m) Waist: N/A  Findings:  Complete study. Poor study with a lot of debris obstructing much of the view for the first approximate 3 hours (out of 6 hours). No obvious tumors, masses, lesions. No obvious source of bleeding. Sequela of bleeding and old blood seen in the form of "whisps" of blood, blood flecks mostly toward the end of the study.  (image times are estimates given poor view) First Gastric image:  00:00:02 First Duodenal image: 00:12:14 First Ileo-Cecal Valve image: N/A First Cecal image: 06:27:41 Gastric Passage time: 00h 81m Small Bowel Passage time:  06h 18m  Summary & Recommendations: Recurrent GI bleed, acute on chronic anemia in the setting of ETOH cirrhosis with recent EGD showing Grade I/II varices, mild portal hypertensive gastropathy, moderate gastritis with no obvious source of melena identified, consider friable gastric and duodenal mucosa in the setting of gastritis/gastropathy.  Unfortunately capsule study did not add much to these findings. At this point, the patient will likely benefit from hematology consult for chronic anemia management.  Thank you for allowing [place] to participate in the care of Jeffrey T Ephriam Jenkins, DNP, AGNP-C Adult & Gerontological Nurse Practitioner [facility]   Attending note: Pertinent images reviewed.  I suspect he is bleeding from time to time from abnormal gastric and proximal small bowel mucosa.  Would continue to follow clinically with periodic assessments of H&H.  Hematology input may be helpful. From a GI perspective, could be discharged anytime.

## 2019-09-22 NOTE — Telephone Encounter (Signed)
Please schedule 2 week hospital follow-up with any APP

## 2019-09-22 NOTE — Discharge Summary (Signed)
Physician Discharge Summary  Jeffrey [name] QHU:[id number] DOB: 1945/11/20 DOA: 09/18/2019  PCP: Monico Blitz, MD  Admit date: 09/18/2019  Discharge date: 09/22/2019  Admitted From:Home  Disposition:  Home  Recommendations for Outpatient Follow-up:  1. Follow up with PCP in 1-2 weeks 2. Follow-up with GI in 2 weeks to review capsule study and recheck H&H 3. Continue home medications as prescribed 4. Continue routine hemodialysis MWF  Home Health:Yes, PT/OT  Equipment/Devices: None  Discharge Condition: Stable  CODE STATUS: Full  Diet recommendation: Heart Healthy/carb modified  Brief/Interim Summary: 74 y.o.malewith medical history significant forend-stage renal disease onM/W/Fhemodialysis,chronic anemia with baseline hemoglobin 4.6-5.6, alcoholic cirrhosis, type 2 diabetes mellitus,who is admitted to [facility] on4/19/2021with acute on chronic anemiaafter presenting from home to [facility] Emergency Department at the direction of hemodialysis center for evaluation of low hemoglobin.  4/21:Plan to transfer to telemetry today. No acute bleeding noted. Patient given agile capsule with plans for capsule endoscopy in a.m. if capsule passes. Plan for hemodialysis as well today.  4/22: Patient had undergone hemodialysis yesterday with 4 L removed and no complications.  Hemoglobin remained stable at 8.2 this morning.  Small capsule endoscopy initiated per GI.  4/23: Patient has undergone small capsule endoscopy without any significant findings noted.  He will need close follow-up with GI in the next 2 weeks for further review.  In the meantime, his anemia has remained stable and he has undergone hemodialysis on the day of discharge with no complications or other acute concerns or findings.  He may remain on his usual home medications and follow-up with his PCP as well as with GI as noted.  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  Discharge Instructions  Discharge Instructions    Diet - low sodium heart healthy   Complete by: As directed    Increase activity slowly   Complete by: As directed      Allergies as of 09/22/2019      Reactions   Chlorhexidine    Tape Itching, Rash      Medication List    STOP taking these medications   Tresiba FlexTouch 200 UNIT/ML FlexTouch Pen Generic drug: insulin degludec     TAKE these medications   cholestyramine 4 GM/DOSE powder Commonly known as: QUESTRAN Take 4 g by mouth in the morning.   diazepam 5 MG tablet Commonly known as: VALIUM Take 5 mg by mouth daily as needed for anxiety.   furosemide 80 MG tablet Commonly known as: LASIX Take 80 mg by mouth in the morning.   gabapentin 100 MG capsule Commonly known as: NEURONTIN Take 100 mg by mouth at bedtime.   HYDROcodone-acetaminophen 10-325 MG tablet Commonly known as: NORCO Take 1 tablet by mouth every 6 (six) hours as needed for moderate pain.   hydrOXYzine 25 MG capsule Commonly known as: VISTARIL Take 25 mg by mouth 3 (three) times daily as needed for itching.   insulin lispro 100 UNIT/ML KiwkPen Commonly known as: HUMALOG Inject 50 Units into the skin 3 (three) times daily.   iron polysaccharides 150 MG capsule Commonly known as: NIFEREX  Take 150 mg by mouth at bedtime.   metoprolol tartrate 25 MG tablet Commonly known as: LOPRESSOR Take 25 mg by mouth 2 (two) times daily.   milk thistle 175 MG tablet Take 175 mg by mouth 3 (three) times daily.   mirtazapine 15 MG tablet Commonly known as: REMERON Take 15 mg by mouth at  bedtime.   multivitamin with minerals Tabs tablet Take 1 tablet by mouth in the morning.   omega-3 acid ethyl esters 1 g capsule Commonly known as: LOVAZA Take 2 g by mouth 2 (two) times daily.   omeprazole 40 MG capsule Commonly known as: PRILOSEC Take 40 mg by mouth in the morning.   pravastatin 20 MG tablet Commonly known as: PRAVACHOL Take 20 mg by mouth in the morning.   PROBIOTIC DAILY PO Take 1 tablet by mouth in the morning.   rifaximin 550 MG Tabs tablet Commonly known as: Xifaxan Take 1 tablet (550 mg total) by mouth 2 (two) times daily.   spironolactone 50 MG tablet Commonly known as: ALDACTONE Take 50 mg by mouth in the morning.   tamsulosin 0.4 MG Caps capsule Commonly known as: FLOMAX Take 0.4 mg by mouth every evening.   thiamine 100 MG tablet Take 100 mg by mouth in the morning. Vitamin B-1   tiZANidine 2 MG tablet Commonly known as: ZANAFLEX Take 2 mg by mouth every 6 (six) hours as needed for muscle spasms.   triamcinolone cream 0.1 % Commonly known as: KENALOG Apply 1 application topically 2 (two) times daily as needed (dry skin Itching).   Vancomycin HCl 25 MG/ML Solr Take 125 mg by mouth See admin instructions. Starting on 08/27/2019 take 28mls by mouth 4 times daily for 2 weeks, then take 78mls three times daily for 2 weeks, then take 5 mls twice daily as directed   venlafaxine 75 MG tablet Commonly known as: EFFEXOR Take 75 mg by mouth 3 (three) times daily with meals.   vitamin C 1000 MG tablet Take 1,000 mg by mouth 2 (two) times daily.      Follow-up Information    Monico Blitz, MD Follow up in 1 week(s).   Specialty: Internal Medicine Contact information: [place] [address] [phone]        Daneil Dolin, MD Follow up in 2 week(s).   Specialty: Gastroenterology Contact information: [place] [address] [phone]          Allergies  Allergen Reactions  . Chlorhexidine   . Tape  Itching and Rash    Consultations:  GI  Nephrology   Procedures/Studies: DG Abd 1 View  Result Date: 09/21/2019 CLINICAL DATA:  Rectal bleeding. Capsule study. EXAM: ABDOMEN - 1 VIEW COMPARISON:  Abdominal radiograph dated 05/27/2016 FINDINGS: The capsule is in the lower mid pelvis, likely in the rectum. Bowel gas pattern is normal. No visible free air or free fluid on the supine images. No acute bone abnormality. IMPRESSION: 1. The capsule is in the rectum. 2. Otherwise, benign appearing abdomen. Electronically Signed   By: Lorriane Shire M.D.   On: 09/21/2019 10:52   CT Head Wo Contrast  Result Date: 09/08/2019 CLINICAL DATA:  Fall, head injury, bruise to forehead, neck pain EXAM: CT HEAD WITHOUT CONTRAST CT CERVICAL SPINE WITHOUT CONTRAST TECHNIQUE: Multidetector CT imaging of the head and cervical spine was performed following the standard protocol without intravenous contrast. Multiplanar CT image reconstructions of the cervical spine were also generated. COMPARISON:  None. FINDINGS: CT  HEAD FINDINGS Brain: No evidence of acute infarction, hemorrhage, hydrocephalus, extra-axial collection or mass lesion/mass effect. Subcortical white matter and periventricular small vessel ischemic changes. Vascular: Intracranial atherosclerosis. Skull: Normal. Negative for fracture or focal lesion. Sinuses/Orbits: The visualized paranasal sinuses are essentially clear. The mastoid air cells are unopacified. Other: Very mild soft tissue swelling overlying the right frontal bone (series 2/image 16). CT CERVICAL SPINE FINDINGS Alignment: Normal cervical lordosis. Skull base and vertebrae: No acute fracture. No primary bone lesion or focal pathologic process. Soft tissues and spinal canal: No prevertebral fluid or swelling. No visible canal hematoma. Disc levels: Mild to moderate degenerative changes with bulky anterior osteophytosis along the anterior mid/lower cervical spine. Spinal canal is patent. Upper chest:  Visualized lung apices are clear. Other: Visualized thyroid is unremarkable. IMPRESSION: Very mild soft tissue swelling overlying the right frontal bone. No evidence of calvarial fracture. No evidence of acute intracranial abnormality. Small vessel ischemic changes. No evidence of traumatic injury to the cervical spine. Mild to moderate degenerative changes with bulky anterior osteophytosis. Electronically Signed   By: Julian Hy M.D.   On: 09/08/2019 18:06   CT Cervical Spine Wo Contrast  Result Date: 09/08/2019 CLINICAL DATA:  Fall, head injury, bruise to forehead, neck pain EXAM: CT HEAD WITHOUT CONTRAST CT CERVICAL SPINE WITHOUT CONTRAST TECHNIQUE: Multidetector CT imaging of the head and cervical spine was performed following the standard protocol without intravenous contrast. Multiplanar CT image reconstructions of the cervical spine were also generated. COMPARISON:  None. FINDINGS: CT HEAD FINDINGS Brain: No evidence of acute infarction, hemorrhage, hydrocephalus, extra-axial collection or mass lesion/mass effect. Subcortical white matter and periventricular small vessel ischemic changes. Vascular: Intracranial atherosclerosis. Skull: Normal. Negative for fracture or focal lesion. Sinuses/Orbits: The visualized paranasal sinuses are essentially clear. The mastoid air cells are unopacified. Other: Very mild soft tissue swelling overlying the right frontal bone (series 2/image 16). CT CERVICAL SPINE FINDINGS Alignment: Normal cervical lordosis. Skull base and vertebrae: No acute fracture. No primary bone lesion or focal pathologic process. Soft tissues and spinal canal: No prevertebral fluid or swelling. No visible canal hematoma. Disc levels: Mild to moderate degenerative changes with bulky anterior osteophytosis along the anterior mid/lower cervical spine. Spinal canal is patent. Upper chest: Visualized lung apices are clear. Other: Visualized thyroid is unremarkable. IMPRESSION: Very mild soft  tissue swelling overlying the right frontal bone. No evidence of calvarial fracture. No evidence of acute intracranial abnormality. Small vessel ischemic changes. No evidence of traumatic injury to the cervical spine. Mild to moderate degenerative changes with bulky anterior osteophytosis. Electronically Signed   By: Julian Hy M.D.   On: 09/08/2019 18:06   DG Chest Port 1 View  Result Date: 09/18/2019 CLINICAL DATA:  Shortness of breath EXAM: PORTABLE CHEST 1 VIEW COMPARISON:  05/31/2019 FINDINGS: Right dialysis catheter in place with the tip in the upper right atrium. No pneumothorax. Cardiomegaly with vascular congestion. No confluent opacities or effusions. No acute bony abnormality. IMPRESSION: Right dialysis catheter tip in the upper right atrium. Cardiomegaly, vascular congestion. Electronically Signed   By: Rolm Baptise M.D.   On: 09/18/2019 19:03   DG Knee Complete 4 Views Right  Result Date: 09/08/2019 CLINICAL DATA:  74 year old male with fall and trauma to the right knee. EXAM: RIGHT KNEE - COMPLETE 4+ VIEW COMPARISON:  None. FINDINGS: There is no acute fracture or dislocation. The bones are osteopenic. There is mild to moderate osteoarthritic changes with tricompartmental narrowing, meniscal chondrocalcinosis, and bone spurring. No significant joint effusion.  The soft tissues are grossly unremarkable. Vascular calcifications noted. IMPRESSION: 1. No acute fracture or dislocation. 2. Osteoarthritis. Electronically Signed   By: Anner Crete M.D.   On: 09/08/2019 17:58   US ABDOMEN LIMITED RUQ  Result Date: 09/07/2019 CLINICAL DATA:  Cirrhosis, ascites, hepatocellular carcinoma screening EXAM: ULTRASOUND ABDOMEN LIMITED RIGHT UPPER QUADRANT COMPARISON:  01/23/2019 FINDINGS: Gallbladder: Multiple shadowing gallstones in gallbladder up to 2.1 cm diameter. No gallbladder wall thickening, pericholecystic fluid or sonographic Murphy sign. Common bile duct: Diameter: 4 mm, normal Liver:  Nodular liver with coarsened hepatic echogenicity consistent with history of alcoholic cirrhosis. No hepatic mass identified. Portal vein is patent on color Doppler imaging with normal direction of blood flow towards the liver. Other: Tiny amount of perihepatic free fluid noted. No significant ascites identified upon survey imaging of the 4 quadrants. IMPRESSION: Cholelithiasis. Cirrhotic appearing liver without mass. Minimal perihepatic ascites, insufficient for paracentesis. Electronically Signed   By: Lavonia Dana M.D.   On: 09/07/2019 10:38     Discharge Exam: Vitals:   09/22/19 1630 09/22/19 1645  BP: (!) 140/53 (!) 143/55  Pulse: (!) 107 (!) 105  Resp: (!) 21 (!) 25  Temp:    SpO2:  96%   Vitals:   09/22/19 1600 09/22/19 1615 09/22/19 1630 09/22/19 1645  BP: (!) 177/67 (!) 157/63 (!) 140/53 (!) 143/55  Pulse: (!) 110 (!) 105 (!) 107 (!) 105  Resp: 20 19 (!) 21 (!) 25  Temp: 98 F (36.7 C)     TempSrc: Oral     SpO2:    96%  Weight:      Height:        General: Pt is alert, awake, not in acute distress Cardiovascular: RRR, S1/S2 +, no rubs, no gallops Respiratory: CTA bilaterally, no wheezing, no rhonchi Abdominal: Soft, NT, ND, bowel sounds + Extremities: no edema, no cyanosis    The results of significant diagnostics from this hospitalization (including imaging, microbiology, ancillary and laboratory) are listed below for reference.     Microbiology: Recent Results (from the past 240 hour(s))  Respiratory Panel by RT PCR (Flu A&B, Covid) - Nasopharyngeal Swab     Status: None   Collection Time: 09/18/19  8:05 PM   Specimen: Nasopharyngeal Swab  Result Value Ref Range Status   SARS Coronavirus 2 by RT PCR NEGATIVE NEGATIVE Final    Comment: (NOTE) SARS-CoV-2 target nucleic acids are NOT DETECTED. The SARS-CoV-2 RNA is generally detectable in upper respiratoy specimens during the acute phase of infection. The lowest concentration of SARS-CoV-2 viral copies this  assay can detect is 131 copies/mL. A negative result does not preclude SARS-Cov-2 infection and should not be used as the sole basis for treatment or other patient management decisions. A negative result may occur with  improper specimen collection/handling, submission of specimen other than nasopharyngeal swab, presence of viral mutation(s) within the areas targeted by this assay, and inadequate number of viral copies (<131 copies/mL). A negative result must be combined with clinical observations, patient history, and epidemiological information. The expected result is Negative. Fact Sheet for Patients:  PinkCheek.be Fact Sheet for Healthcare Providers:  GravelBags.it This test is not yet ap proved or cleared by the [place] FDA and  has been authorized for detection and/or diagnosis of SARS-CoV-2 by FDA under an Emergency Use Authorization (EUA). This EUA will remain  in effect (meaning this test can be used) for the duration of the COVID-19 declaration under Section 564(b)(1) of the Act, 21 U.S.C.  section 360bbb-3(b)(1), unless the authorization is terminated or revoked sooner.    Influenza A by PCR NEGATIVE NEGATIVE Final   Influenza B by PCR NEGATIVE NEGATIVE Final    Comment: (NOTE) The Xpert Xpress SARS-CoV-2/FLU/RSV assay is intended as an aid in  the diagnosis of influenza from Nasopharyngeal swab specimens and  should not be used as a sole basis for treatment. Nasal washings and  aspirates are unacceptable for Xpert Xpress SARS-CoV-2/FLU/RSV  testing. Fact Sheet for Patients: PinkCheek.be Fact Sheet for Healthcare Providers: GravelBags.it This test is not yet approved or cleared by the [place] FDA and  has been authorized for detection and/or diagnosis of SARS-CoV-2 by  FDA under an Emergency Use Authorization (EUA). This EUA will remain  in  effect (meaning this test can be used) for the duration of the  Covid-19 declaration under Section 564(b)(1) of the Act, 21  U.S.C. section 360bbb-3(b)(1), unless the authorization is  terminated or revoked. Performed at [facility], [address] [place], [place] [address]   MRSA PCR Screening     Status: None   Collection Time: 09/18/19  9:57 PM   Specimen: Nasopharyngeal  Result Value Ref Range Status   MRSA by PCR NEGATIVE NEGATIVE Final    Comment:        The GeneXpert MRSA Assay (FDA approved for NASAL specimens only), is one component of a comprehensive MRSA colonization surveillance program. It is not intended to diagnose MRSA infection nor to guide or monitor treatment for MRSA infections. Performed at [facility], [address] [place], [place] [address]      Labs: BNP (last 3 results) No results for input(s): BNP in the last 8760 hours. Basic Metabolic Panel: Recent Labs  Lab 09/18/19 1715 09/19/19 0828 09/20/19 0423 09/21/19 0413 09/22/19 0509  NA 132* 136 131* 134*  134* 132*  K 3.8 3.8 3.8 3.5  3.5 3.9  CL 96* 97* 97* 96*  96* 95*  CO2 23 26 21* 27  28 25   GLUCOSE 126* 91 91 76  75 110*  BUN 22 27* 32* 17  17 22   CREATININE 2.75* 3.38* 3.90* 2.52*  2.52* 3.21*  CALCIUM 8.1* 8.9 8.9 8.6*  8.5* 8.6*  MG 2.1 2.1 2.2  --  1.7  PHOS  --  6.1*  --  4.5  --    Liver Function Tests: Recent Labs  Lab 09/18/19 1715 09/19/19 0828 09/20/19 0423 09/21/19 0413  AST 42* 29 30  --   ALT 19 21 20   --   ALKPHOS 98 93 97  --   BILITOT 0.9 1.0 1.1  --   PROT 7.0 6.9 6.9  --   ALBUMIN 2.8* 2.8* 2.8* 2.7*   No results for input(s): LIPASE, AMYLASE in the last 168 hours. No results for input(s): AMMONIA in the last 168 hours. CBC: Recent Labs  Lab 09/18/19 1715 09/19/19 0828 09/20/19 0423 09/21/19 0413 09/22/19 0509  WBC 3.4* 3.9* 4.4 3.2* 3.6*  NEUTROABS 2.5  --  3.1  --   --   HGB 5.4* 7.0* 7.9* 8.2* 8.4*  HCT 18.7* 24.1* 26.6* 28.1*  28.2*  MCV 108.1* 102.1* 99.3 100.0 98.6  PLT 124* 127* 124* 109* 113*   Cardiac Enzymes: No results for input(s): CKTOTAL, CKMB, CKMBINDEX, TROPONINI in the last 168 hours. BNP: Invalid input(s): POCBNP CBG: Recent Labs  Lab 09/21/19 1434 09/21/19 1634 09/21/19 2205 09/22/19 0604 09/22/19 1122  GLUCAP 91 111* 125* 125* 124*   D-Dimer No results  for input(s): DDIMER in the last 72 hours. Hgb A1c No results for input(s): HGBA1C in the last 72 hours. Lipid Profile No results for input(s): CHOL, HDL, LDLCALC, TRIG, CHOLHDL, LDLDIRECT in the last 72 hours. Thyroid function studies No results for input(s): TSH, T4TOTAL, T3FREE, THYROIDAB in the last 72 hours.  Invalid input(s): FREET3 Anemia work up No results for input(s): VITAMINB12, FOLATE, FERRITIN, TIBC, IRON, RETICCTPCT in the last 72 hours. Urinalysis    Component Value Date/Time   COLORURINE AMBER (A) 03/31/2016 1703   APPEARANCEUR CLEAR 03/31/2016 1703   LABSPEC 1.011 03/31/2016 1703   PHURINE 7.5 03/31/2016 1703   GLUCOSEU NEGATIVE 03/31/2016 1703   HGBUR NEGATIVE 03/31/2016 1703   BILIRUBINUR NEGATIVE 03/31/2016 1703   KETONESUR NEGATIVE 03/31/2016 1703   PROTEINUR NEGATIVE 03/31/2016 1703   NITRITE NEGATIVE 03/31/2016 1703   LEUKOCYTESUR NEGATIVE 03/31/2016 1703   Sepsis Labs Invalid input(s): PROCALCITONIN,  WBC,  LACTICIDVEN Microbiology Recent Results (from the past 240 hour(s))  Respiratory Panel by RT PCR (Flu A&B, Covid) - Nasopharyngeal Swab     Status: None   Collection Time: 09/18/19  8:05 PM   Specimen: Nasopharyngeal Swab  Result Value Ref Range Status   SARS Coronavirus 2 by RT PCR NEGATIVE NEGATIVE Final    Comment: (NOTE) SARS-CoV-2 target nucleic acids are NOT DETECTED. The SARS-CoV-2 RNA is generally detectable in upper respiratoy specimens during the acute phase of infection. The lowest concentration of SARS-CoV-2 viral copies this assay can detect is 131 copies/mL. A negative result  does not preclude SARS-Cov-2 infection and should not be used as the sole basis for treatment or other patient management decisions. A negative result may occur with  improper specimen collection/handling, submission of specimen other than nasopharyngeal swab, presence of viral mutation(s) within the areas targeted by this assay, and inadequate number of viral copies (<131 copies/mL). A negative result must be combined with clinical observations, patient history, and epidemiological information. The expected result is Negative. Fact Sheet for Patients:  PinkCheek.be Fact Sheet for Healthcare Providers:  GravelBags.it This test is not yet ap proved or cleared by the [place] FDA and  has been authorized for detection and/or diagnosis of SARS-CoV-2 by FDA under an Emergency Use Authorization (EUA). This EUA will remain  in effect (meaning this test can be used) for the duration of the COVID-19 declaration under Section 564(b)(1) of the Act, 21 U.S.C. section 360bbb-3(b)(1), unless the authorization is terminated or revoked sooner.    Influenza A by PCR NEGATIVE NEGATIVE Final   Influenza B by PCR NEGATIVE NEGATIVE Final    Comment: (NOTE) The Xpert Xpress SARS-CoV-2/FLU/RSV assay is intended as an aid in  the diagnosis of influenza from Nasopharyngeal swab specimens and  should not be used as a sole basis for treatment. Nasal washings and  aspirates are unacceptable for Xpert Xpress SARS-CoV-2/FLU/RSV  testing. Fact Sheet for Patients: PinkCheek.be Fact Sheet for Healthcare Providers: GravelBags.it This test is not yet approved or cleared by the [place] FDA and  has been authorized for detection and/or diagnosis of SARS-CoV-2 by  FDA under an Emergency Use Authorization (EUA). This EUA will remain  in effect (meaning this test can be used) for the duration of  the  Covid-19 declaration under Section 564(b)(1) of the Act, 21  U.S.C. section 360bbb-3(b)(1), unless the authorization is  terminated or revoked. Performed at [facility], [address], [place], [place] [address]   MRSA PCR Screening     Status: None  Collection Time: 09/18/19  9:57 PM   Specimen: Nasopharyngeal  Result Value Ref Range Status   MRSA by PCR NEGATIVE NEGATIVE Final    Comment:        The GeneXpert MRSA Assay (FDA approved for NASAL specimens only), is one component of a comprehensive MRSA colonization surveillance program. It is not intended to diagnose MRSA infection nor to guide or monitor treatment for MRSA infections. Performed at [facility], [address], [place], [place] [address]      Time coordinating discharge: 35 minutes  SIGNED:   Rodena Goldmann, DO [facility] 09/22/2019, 4:51 PM  If 7PM-7AM, please contact night-coverage [email or web address]

## 2019-09-22 NOTE — Procedures (Signed)
     HEMODIALYSIS TREATMENT NOTE:  4 hour heparin-free HD completed via right chest wall TDC.  Goal met: 4 liters removed without interruption in UF. All blood was returned.  Rockwell Alexandria, RN

## 2019-09-22 NOTE — Progress Notes (Signed)
Patient ID: Jeffrey [name], male   DOB: 03/05/1946, 74 y.o.   MRN: [id number]   S: had Givens capsule study yesterday, passed the capsule at about 0400 this AM.  Sleeping and has no complaints at present.    O:BP (!) 147/53   Pulse 99   Temp 97.7 F (36.5 C) (Oral)   Resp 15   Ht 5\' 7"  (1.702 m)   Wt 117.8 kg   SpO2 90%   BMI 40.68 kg/m   Intake/Output Summary (Last 24 hours) at 09/22/2019 0948 Last data filed at 09/21/2019 1700 Gross per 24 hour  Intake --  Output 700 ml  Net -700 ml   Intake/Output: I/O last 3 completed shifts: In: -  Out: 4700 [Urine:700; Other:4000]  Intake/Output this shift:  No intake/output data recorded. Weight change: -2.8 kg   Gen: NAD CVS: no rub Resp: cta Abd: +BS, soft, NT/ND Ext: 1+ edema, LUE BVT +T/B, R IJ [name]  Recent Labs  Lab 09/18/19 1715 09/19/19 0828 09/20/19 0423 09/21/19 0413 09/22/19 0509  NA 132* 136 131* 134*  134* 132*  K 3.8 3.8 3.8 3.5  3.5 3.9  CL 96* 97* 97* 96*  96* 95*  CO2 23 26 21* 27  28 25   GLUCOSE 126* 91 91 76  75 110*  BUN 22 27* 32* 17  17 22   CREATININE 2.75* 3.38* 3.90* 2.52*  2.52* 3.21*  ALBUMIN 2.8* 2.8* 2.8* 2.7*  --   CALCIUM 8.1* 8.9 8.9 8.6*  8.5* 8.6*  PHOS  --  6.1*  --  4.5  --   AST 42* 29 30  --   --   ALT 19 21 20   --   --    Liver Function Tests: Recent Labs  Lab 09/18/19 1715 09/18/19 1715 09/19/19 0828 09/20/19 0423 09/21/19 0413  AST 42*  --  29 30  --   ALT 19  --  21 20  --   ALKPHOS 98  --  93 97  --   BILITOT 0.9  --  1.0 1.1  --   PROT 7.0  --  6.9 6.9  --   ALBUMIN 2.8*   < > 2.8* 2.8* 2.7*   < > = values in this interval not displayed.   No results for input(s): LIPASE, AMYLASE in the last 168 hours. No results for input(s): AMMONIA in the last 168 hours. CBC: Recent Labs  Lab 09/18/19 1715 09/18/19 1715 09/19/19 0828 09/19/19 0828 09/20/19 0423 09/21/19 0413 09/22/19 0509  WBC 3.4*   < > 3.9*   < > 4.4 3.2* 3.6*  NEUTROABS 2.5  --   --   --  3.1   --   --   HGB 5.4*   < > 7.0*   < > 7.9* 8.2* 8.4*  HCT 18.7*   < > 24.1*   < > 26.6* 28.1* 28.2*  MCV 108.1*  --  102.1*  --  99.3 100.0 98.6  PLT 124*   < > 127*   < > 124* 109* 113*   < > = values in this interval not displayed.   Cardiac Enzymes: No results for input(s): CKTOTAL, CKMB, CKMBINDEX, TROPONINI in the last 168 hours. CBG: Recent Labs  Lab 09/21/19 1324 09/21/19 1434 09/21/19 1634 09/21/19 2205 09/22/19 0604  GLUCAP 61* 91 111* 125* 125*    Iron Studies:  No results for input(s): IRON, TIBC, TRANSFERRIN, FERRITIN in the last 72 hours. Studies/Results: DG Abd 1 View  Result Date: 09/21/2019 CLINICAL DATA:  Rectal bleeding. Capsule study. EXAM: ABDOMEN - 1 VIEW COMPARISON:  Abdominal radiograph dated 05/27/2016 FINDINGS: The capsule is in the lower mid pelvis, likely in the rectum. Bowel gas pattern is normal. No visible free air or free fluid on the supine images. No acute bone abnormality. IMPRESSION: 1. The capsule is in the rectum. 2. Otherwise, benign appearing abdomen. Electronically Signed   By: Lorriane Shire M.D.   On: 09/21/2019 10:52   . sodium chloride   Intravenous Once  . [START ON 09/25/2019] darbepoetin (ARANESP) injection - NON-DIALYSIS  60 mcg Subcutaneous Q Mon-1800  . furosemide  80 mg Oral Daily  . insulin aspart  0-9 Units Subcutaneous Q6H  . insulin glargine  30 Units Subcutaneous QHS  . lidocaine  5 mL Mouth/Throat Once  . mouth rinse  15 mL Mouth Rinse BID  . pantoprazole  40 mg Oral BID AC  . rifaximin  550 mg Oral BID  . sodium chloride flush  3 mL Intravenous Q12H    BMET    Component Value Date/Time   NA 132 (L) 09/22/2019 0509   K 3.9 09/22/2019 0509   CL 95 (L) 09/22/2019 0509   CO2 25 09/22/2019 0509   GLUCOSE 110 (H) 09/22/2019 0509   BUN 22 09/22/2019 0509   CREATININE 3.21 (H) 09/22/2019 0509   CREATININE 2.40 (H) 09/07/2019 1047   CALCIUM 8.6 (L) 09/22/2019 0509   GFRNONAA 18 (L) 09/22/2019 0509   GFRNONAA 41 (L)  06/02/2018 1219   GFRAA 21 (L) 09/22/2019 0509   GFRAA 47 (L) 06/02/2018 1219   CBC    Component Value Date/Time   WBC 3.6 (L) 09/22/2019 0509   RBC 2.86 (L) 09/22/2019 0509   HGB 8.4 (L) 09/22/2019 0509   HCT 28.2 (L) 09/22/2019 0509   HCT 28 02/25/2012 1034   PLT 113 (L) 09/22/2019 0509   PLT 125 02/25/2012 1034   MCV 98.6 09/22/2019 0509   MCV 82.0 02/25/2012 1034   MCH 29.4 09/22/2019 0509   MCHC 29.8 (L) 09/22/2019 0509   RDW 20.5 (H) 09/22/2019 0509   LYMPHSABS 0.4 (L) 09/20/2019 0423   MONOABS 0.4 09/20/2019 0423   EOSABS 0.5 09/20/2019 0423   BASOSABS 0.0 09/20/2019 0423    Dialysis Orders: [facility]  on MWF . EDW 106kg HD Bath 2K/2.5Ca  Time 4 hrs Heparin none. Access RIJ TDC BFR 350 DFR 600    Epogen 10,000   Units IV/HD    Assessment/Plan: 1.  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   Pt has received 2 units PRBC's.  Continue to follow H/H and transfuse as needed.  Will continue with ESA. GI consulted and EGD performed 09/19/19 without finding source of melena. Capsule endoscopy expelled this AM, awaiting results. 2.  ESRD -  Plan for HD on MWF schedule for now.  HD today.  He has a lot of urine and we are augmenting HD with diuretics 3.  Hypertension/volume  - 13 kg above edw but has not reached his outpatient HD in some time.  Plan for HD today and UF as tolerated. 1. May need new edw but his outpatient unit was going to start him on 4 days per week due to large idwg--> this will need to be done as OP 2. Discussed need for fluid restriction of 1200 ml/day   3. He was on lasix 80 mg daily to help with volume.  Will  restart today and UF with HD as tolerated.  4.  Vascular access- using RIJ TDC but had first stage LUE BVT created on 08/17/19 by Dr. Donnetta Hutching and will require second stage 5.  Metabolic bone disease -   Continue with home meds and renal diet 6.  Nutrition -  Renal diet, carb modified  7. DM  Madelon Lips MD  [place] pgr [phone]

## 2019-09-25 ENCOUNTER — Encounter: Payer: Self-pay | Admitting: Internal Medicine

## 2019-09-25 ENCOUNTER — Telehealth (HOSPITAL_COMMUNITY): Payer: Self-pay

## 2019-09-25 DIAGNOSIS — N2581 Secondary hyperparathyroidism of renal origin: Secondary | ICD-10-CM | POA: Diagnosis not present

## 2019-09-25 DIAGNOSIS — R0602 Shortness of breath: Secondary | ICD-10-CM | POA: Diagnosis not present

## 2019-09-25 DIAGNOSIS — N186 End stage renal disease: Secondary | ICD-10-CM | POA: Diagnosis not present

## 2019-09-25 DIAGNOSIS — D509 Iron deficiency anemia, unspecified: Secondary | ICD-10-CM | POA: Diagnosis not present

## 2019-09-25 DIAGNOSIS — I509 Heart failure, unspecified: Secondary | ICD-10-CM | POA: Diagnosis not present

## 2019-09-25 DIAGNOSIS — Z299 Encounter for prophylactic measures, unspecified: Secondary | ICD-10-CM | POA: Diagnosis not present

## 2019-09-25 DIAGNOSIS — I10 Essential (primary) hypertension: Secondary | ICD-10-CM | POA: Diagnosis not present

## 2019-09-25 DIAGNOSIS — Z992 Dependence on renal dialysis: Secondary | ICD-10-CM | POA: Diagnosis not present

## 2019-09-25 DIAGNOSIS — Z789 Other specified health status: Secondary | ICD-10-CM | POA: Diagnosis not present

## 2019-09-25 LAB — METHYLMALONIC ACID, SERUM: Methylmalonic Acid, Quantitative: 586 nmol/L — ABNORMAL HIGH (ref 0–378)

## 2019-09-25 NOTE — Telephone Encounter (Signed)

## 2019-09-26 ENCOUNTER — Ambulatory Visit (HOSPITAL_COMMUNITY)
Admission: RE | Admit: 2019-09-26 | Discharge: 2019-09-26 | Disposition: A | Discharge status: Home / Self Care | Payer: Medicare Other | Source: Ambulatory Visit | Attending: Vascular Surgery | Admitting: Vascular Surgery

## 2019-09-26 ENCOUNTER — Encounter: Payer: Self-pay | Admitting: Nurse Practitioner

## 2019-09-26 ENCOUNTER — Encounter (HOSPITAL_COMMUNITY): Payer: Self-pay | Admitting: Vascular Surgery

## 2019-09-26 ENCOUNTER — Other Ambulatory Visit (HOSPITAL_COMMUNITY)
Admission: RE | Admit: 2019-09-26 | Discharge: 2019-09-26 | Disposition: A | Discharge status: Home / Self Care | Payer: Medicare Other | Source: Ambulatory Visit | Attending: Vascular Surgery | Admitting: Vascular Surgery

## 2019-09-26 ENCOUNTER — Ambulatory Visit (INDEPENDENT_AMBULATORY_CARE_PROVIDER_SITE_OTHER): Payer: Self-pay | Admitting: Physician Assistant

## 2019-09-26 ENCOUNTER — Other Ambulatory Visit: Payer: Self-pay

## 2019-09-26 DIAGNOSIS — Z20822 Contact with and (suspected) exposure to covid-19: Secondary | ICD-10-CM | POA: Diagnosis not present

## 2019-09-26 DIAGNOSIS — N186 End stage renal disease: Secondary | ICD-10-CM

## 2019-09-26 DIAGNOSIS — R0602 Shortness of breath: Secondary | ICD-10-CM | POA: Diagnosis not present

## 2019-09-26 DIAGNOSIS — Z01818 Encounter for other preprocedural examination: Secondary | ICD-10-CM | POA: Insufficient documentation

## 2019-09-26 DIAGNOSIS — Z992 Dependence on renal dialysis: Secondary | ICD-10-CM | POA: Insufficient documentation

## 2019-09-26 DIAGNOSIS — I509 Heart failure, unspecified: Secondary | ICD-10-CM | POA: Diagnosis not present

## 2019-09-26 NOTE — Progress Notes (Signed)
Spoke with pt and wife, [name] for pre-op call. Pt has hx of A-fib, states he has not seen cardiologist. States it started with he started dialysis. Pt denies any other cardiac history. Pt was just started on home oxygen today. He is on 2 L all day.   Pt is a type 2 diabetic. States his fasting blood sugar is usually around 159. Last A1C was 5.4 on 09/18/19. Instructed [name] to have pt take 1/2 of his usual dose of Tresiba Wednesday evening, he will take 60 units. Instructed her to make sure pt does not take his Humalog the morning of surgery. Instructed her to have pt check his blood sugar when he gets up and every 2 hours until he leaves for the hospital. If blood sugar is 70 or below, treat with 1/2 cup of clear juice (apple or cranberry) and recheck blood sugar 15 minutes after drinking juice. If blood sugar continues to be 70 or below, call the Short Stay department and ask to speak to a nurse. She voiced understanding  Covid test done today, result is pending. [name] states pt is in quarantine (except to go to Dialysis tomorrow) until he comes to the hospital on Thursday.

## 2019-09-26 NOTE — H&P (View-Only) (Signed)
POST OPERATIVE OFFICE NOTE    CC:  F/u for surgery  HPI:  This is a 74 y.o. male who is s/p left 1st stage BVT on 08/17/2019 by Dr. Donnetta Hutching.  He is currently dialyzing via [facility] that was placed at [facility] earlier this year.  He presents today for follow up.    He denies any pain or numbness in his left hand.  Pt is right handed.   He is waiting for home O2 to be set up today.   He has hx of alcoholic cirrhosis and bruises easily.    The pt does not have evidence of steal sx. The pt is on dialysis M/W/F at [facility] in [place].    Allergies  Allergen Reactions  . Chlorhexidine   . Tape Itching and Rash    Current Outpatient Medications  Medication Sig Dispense Refill  . Ascorbic Acid (VITAMIN C) 1000 MG tablet Take 1,000 mg by mouth 2 (two) times daily.    . cholestyramine (QUESTRAN) 4 GM/DOSE powder Take 4 g by mouth in the morning.     . diazepam (VALIUM) 5 MG tablet Take 5 mg by mouth daily as needed for anxiety.     . furosemide (LASIX) 80 MG tablet Take 80 mg by mouth in the morning.     . gabapentin (NEURONTIN) 100 MG capsule Take 100 mg by mouth at bedtime.     [name] HYDROcodone-acetaminophen (NORCO) 10-325 MG tablet Take 1 tablet by mouth every 6 (six) hours as needed for moderate pain.     . hydrOXYzine (VISTARIL) 25 MG capsule Take 25 mg by mouth 3 (three) times daily as needed for itching.     . insulin lispro (HUMALOG) 100 UNIT/ML KiwkPen Inject 50 Units into the skin 3 (three) times daily.     . iron polysaccharides (NIFEREX) 150 MG capsule Take 150 mg by mouth at bedtime.     . metoprolol tartrate (LOPRESSOR) 25 MG tablet Take 25 mg by mouth 2 (two) times daily.    . milk thistle 175 MG tablet Take 175 mg by mouth 3 (three) times daily.     . mirtazapine (REMERON) 15 MG tablet Take 15 mg by mouth at bedtime.    . Multiple Vitamin (MULTIVITAMIN WITH MINERALS) TABS tablet Take 1 tablet by mouth in the morning.     [name] omega-3 acid ethyl esters (LOVAZA) 1 g capsule Take 2 g by mouth 2  (two) times daily.     [name] omeprazole (PRILOSEC) 40 MG capsule Take 40 mg by mouth in the morning.     . pravastatin (PRAVACHOL) 20 MG tablet Take 20 mg by mouth in the morning.     . Probiotic Product (PROBIOTIC DAILY PO) Take 1 tablet by mouth in the morning.     . rifaximin (XIFAXAN) 550 MG TABS tablet Take 1 tablet (550 mg total) by mouth 2 (two) times daily. 60 tablet 11  . spironolactone (ALDACTONE) 50 MG tablet Take 50 mg by mouth in the morning.     . tamsulosin (FLOMAX) 0.4 MG CAPS capsule Take 0.4 mg by mouth every evening.    . thiamine 100 MG tablet Take 100 mg by mouth in the morning. Vitamin B-1    . tiZANidine (ZANAFLEX) 2 MG tablet Take 2 mg by mouth every 6 (six) hours as needed for muscle spasms.     [name] triamcinolone cream (KENALOG) 0.1 % Apply 1 application topically 2 (two) times daily as needed (dry skin Itching).   3  .  Vancomycin HCl 25 MG/ML SOLR Take 125 mg by mouth See admin instructions. Starting on 08/27/2019 take 27mls by mouth 4 times daily for 2 weeks, then take 56mls three times daily for 2 weeks, then take 5 mls twice daily as directed    . venlafaxine (EFFEXOR) 75 MG tablet Take 75 mg by mouth 3 (three) times daily with meals.      No current facility-administered medications for this visit.     ROS:  See HPI  Physical Exam:  Incision:  Healed nicely Extremities:  There is a palpable left radial pulse.  Motor and sensory are in tact.  There is a thrill/bruit present.   Dialysis Duplex on 09/26/2019: Diameter:  0.58cm-0.93cm  Depth:  0.22cm-1.57cm   Assessment/Plan:  This is a 74 y.o. male who is s/p: Left 1st stage BVT   -the pt does not have evidence of steal. -the fistula has matured nicely and will need 2nd stage BVT to superficialize the vein.  Discussed this with the pt and his wife present.  Will schedule this for a Thursday for Dr. Donnetta Hutching.  -If pt has tunneled dialysis catheter and the access has been used successfully to the satisfaction of the  dialysis center, the tunneled catheter can be removed at [facility] at their discretion.     Leontine Locket, [facility] [phone]  Clinic MD:  Early

## 2019-09-26 NOTE — Progress Notes (Signed)
POST OPERATIVE OFFICE NOTE    CC:  F/u for surgery  HPI:  This is a 74 y.o. male who is s/p left 1st stage BVT on 08/17/2019 by Dr. Donnetta Hutching.  He is currently dialyzing via [facility] that was placed at [facility] earlier this year.  He presents today for follow up.    He denies any pain or numbness in his left hand.  Pt is right handed.   He is waiting for home O2 to be set up today.   He has hx of alcoholic cirrhosis and bruises easily.    The pt does not have evidence of steal sx. The pt is on dialysis M/W/F at [facility] in [place].    Allergies  Allergen Reactions  . Chlorhexidine   . Tape Itching and Rash    Current Outpatient Medications  Medication Sig Dispense Refill  . Ascorbic Acid (VITAMIN C) 1000 MG tablet Take 1,000 mg by mouth 2 (two) times daily.    . cholestyramine (QUESTRAN) 4 GM/DOSE powder Take 4 g by mouth in the morning.     . diazepam (VALIUM) 5 MG tablet Take 5 mg by mouth daily as needed for anxiety.     . furosemide (LASIX) 80 MG tablet Take 80 mg by mouth in the morning.     . gabapentin (NEURONTIN) 100 MG capsule Take 100 mg by mouth at bedtime.     [name] HYDROcodone-acetaminophen (NORCO) 10-325 MG tablet Take 1 tablet by mouth every 6 (six) hours as needed for moderate pain.     . hydrOXYzine (VISTARIL) 25 MG capsule Take 25 mg by mouth 3 (three) times daily as needed for itching.     . insulin lispro (HUMALOG) 100 UNIT/ML KiwkPen Inject 50 Units into the skin 3 (three) times daily.     . iron polysaccharides (NIFEREX) 150 MG capsule Take 150 mg by mouth at bedtime.     . metoprolol tartrate (LOPRESSOR) 25 MG tablet Take 25 mg by mouth 2 (two) times daily.    . milk thistle 175 MG tablet Take 175 mg by mouth 3 (three) times daily.     . mirtazapine (REMERON) 15 MG tablet Take 15 mg by mouth at bedtime.    . Multiple Vitamin (MULTIVITAMIN WITH MINERALS) TABS tablet Take 1 tablet by mouth in the morning.     [name] omega-3 acid ethyl esters (LOVAZA) 1 g capsule Take 2 g by mouth 2  (two) times daily.     [name] omeprazole (PRILOSEC) 40 MG capsule Take 40 mg by mouth in the morning.     . pravastatin (PRAVACHOL) 20 MG tablet Take 20 mg by mouth in the morning.     . Probiotic Product (PROBIOTIC DAILY PO) Take 1 tablet by mouth in the morning.     . rifaximin (XIFAXAN) 550 MG TABS tablet Take 1 tablet (550 mg total) by mouth 2 (two) times daily. 60 tablet 11  . spironolactone (ALDACTONE) 50 MG tablet Take 50 mg by mouth in the morning.     . tamsulosin (FLOMAX) 0.4 MG CAPS capsule Take 0.4 mg by mouth every evening.    . thiamine 100 MG tablet Take 100 mg by mouth in the morning. Vitamin B-1    . tiZANidine (ZANAFLEX) 2 MG tablet Take 2 mg by mouth every 6 (six) hours as needed for muscle spasms.     [name] triamcinolone cream (KENALOG) 0.1 % Apply 1 application topically 2 (two) times daily as needed (dry skin Itching).   3  .  Vancomycin HCl 25 MG/ML SOLR Take 125 mg by mouth See admin instructions. Starting on 08/27/2019 take 32mls by mouth 4 times daily for 2 weeks, then take 5mls three times daily for 2 weeks, then take 5 mls twice daily as directed    . venlafaxine (EFFEXOR) 75 MG tablet Take 75 mg by mouth 3 (three) times daily with meals.      No current facility-administered medications for this visit.     ROS:  See HPI  Physical Exam:  Incision:  Healed nicely Extremities:  There is a palpable left radial pulse.  Motor and sensory are in tact.  There is a thrill/bruit present.   Dialysis Duplex on 09/26/2019: Diameter:  0.58cm-0.93cm  Depth:  0.22cm-1.57cm   Assessment/Plan:  This is a 74 y.o. male who is s/p: Left 1st stage BVT   -the pt does not have evidence of steal. -the fistula has matured nicely and will need 2nd stage BVT to superficialize the vein.  Discussed this with the pt and his wife present.  Will schedule this for a Thursday for Dr. Donnetta Hutching.  -If pt has tunneled dialysis catheter and the access has been used successfully to the satisfaction of the  dialysis center, the tunneled catheter can be removed at [facility] at their discretion.     Leontine Locket, [facility] [phone]  Clinic MD:  Early

## 2019-09-27 DIAGNOSIS — Z992 Dependence on renal dialysis: Secondary | ICD-10-CM | POA: Diagnosis not present

## 2019-09-27 DIAGNOSIS — N186 End stage renal disease: Secondary | ICD-10-CM | POA: Diagnosis not present

## 2019-09-27 LAB — SARS CORONAVIRUS 2 (TAT 6-24 HRS): SARS Coronavirus 2: NEGATIVE

## 2019-09-28 ENCOUNTER — Ambulatory Visit (HOSPITAL_COMMUNITY): Payer: Medicare Other | Admitting: Anesthesiology

## 2019-09-28 ENCOUNTER — Encounter (HOSPITAL_COMMUNITY): Payer: Self-pay | Admitting: Vascular Surgery

## 2019-09-28 ENCOUNTER — Encounter (HOSPITAL_COMMUNITY)
Admission: RE | Disposition: A | Discharge status: Home / Self Care | Payer: Self-pay | Source: Home / Self Care | Attending: Vascular Surgery

## 2019-09-28 ENCOUNTER — Other Ambulatory Visit: Payer: Self-pay

## 2019-09-28 ENCOUNTER — Ambulatory Visit (HOSPITAL_COMMUNITY)
Admission: RE | Admit: 2019-09-28 | Discharge: 2019-09-28 | Disposition: A | Discharge status: Home / Self Care | Payer: Medicare Other | Attending: Vascular Surgery | Admitting: Vascular Surgery

## 2019-09-28 DIAGNOSIS — Z992 Dependence on renal dialysis: Secondary | ICD-10-CM | POA: Diagnosis not present

## 2019-09-28 DIAGNOSIS — I10 Essential (primary) hypertension: Secondary | ICD-10-CM | POA: Diagnosis not present

## 2019-09-28 DIAGNOSIS — N186 End stage renal disease: Secondary | ICD-10-CM

## 2019-09-28 DIAGNOSIS — E1122 Type 2 diabetes mellitus with diabetic chronic kidney disease: Secondary | ICD-10-CM | POA: Diagnosis not present

## 2019-09-28 DIAGNOSIS — N185 Chronic kidney disease, stage 5: Secondary | ICD-10-CM

## 2019-09-28 DIAGNOSIS — Z79899 Other long term (current) drug therapy: Secondary | ICD-10-CM | POA: Insufficient documentation

## 2019-09-28 DIAGNOSIS — Z888 Allergy status to other drugs, medicaments and biological substances status: Secondary | ICD-10-CM | POA: Insufficient documentation

## 2019-09-28 DIAGNOSIS — Z794 Long term (current) use of insulin: Secondary | ICD-10-CM | POA: Diagnosis not present

## 2019-09-28 HISTORY — DX: Dyspnea, unspecified: R06.00

## 2019-09-28 HISTORY — PX: BASCILIC VEIN TRANSPOSITION: SHX5742

## 2019-09-28 HISTORY — DX: Gastro-esophageal reflux disease without esophagitis: K21.9

## 2019-09-28 LAB — POCT I-STAT, CHEM 8
BUN: 15 mg/dL (ref 8–23)
Calcium, Ion: 1.14 mmol/L — ABNORMAL LOW (ref 1.15–1.40)
Chloride: 98 mmol/L (ref 98–111)
Creatinine, Ser: 2.3 mg/dL — ABNORMAL HIGH (ref 0.61–1.24)
Glucose, Bld: 77 mg/dL (ref 70–99)
HCT: 29 % — ABNORMAL LOW (ref 39.0–52.0)
Hemoglobin: 9.9 g/dL — ABNORMAL LOW (ref 13.0–17.0)
Potassium: 3.4 mmol/L — ABNORMAL LOW (ref 3.5–5.1)
Sodium: 138 mmol/L (ref 135–145)
TCO2: 29 mmol/L (ref 22–32)

## 2019-09-28 LAB — GLUCOSE, CAPILLARY
Glucose-Capillary: 77 mg/dL (ref 70–99)
Glucose-Capillary: 110 mg/dL — ABNORMAL HIGH (ref 70–99)
Glucose-Capillary: 64 mg/dL — ABNORMAL LOW (ref 70–99)
Glucose-Capillary: 85 mg/dL (ref 70–99)

## 2019-09-28 SURGERY — TRANSPOSITION, VEIN, BASILIC
Anesthesia: General | Site: Arm Upper | Laterality: Left

## 2019-09-28 MED ORDER — ACETAMINOPHEN 500 MG PO TABS
1000.0000 mg | ORAL_TABLET | Freq: Once | ORAL | Status: AC
  Administered 2019-09-28: 1000 mg via ORAL

## 2019-09-28 MED ORDER — DEXAMETHASONE SODIUM PHOSPHATE 10 MG/ML IJ SOLN
INTRAMUSCULAR | Status: DC | PRN
  Administered 2019-09-28: 10 mg via INTRAVENOUS

## 2019-09-28 MED ORDER — HYDROCODONE-ACETAMINOPHEN 10-325 MG PO TABS: 1.0000 | ORAL_TABLET | Freq: Four times a day (QID) | ORAL | 0 refills | Status: DC | PRN

## 2019-09-28 MED ORDER — FENTANYL CITRATE (PF) 100 MCG/2ML IJ SOLN
INTRAMUSCULAR | Status: DC | PRN
  Administered 2019-09-28: 50 ug via INTRAVENOUS

## 2019-09-28 MED ORDER — CEFAZOLIN SODIUM-DEXTROSE 2-4 GM/100ML-% IV SOLN
2.0000 g | INTRAVENOUS | Status: AC
  Administered 2019-09-28: 2 g via INTRAVENOUS
  Filled 2019-09-28: qty 100

## 2019-09-28 MED ORDER — PROPOFOL 10 MG/ML IV BOLUS
INTRAVENOUS | Status: AC
  Filled 2019-09-28: qty 20

## 2019-09-28 MED ORDER — SODIUM CHLORIDE 0.9 % IV SOLN
INTRAVENOUS | Status: AC
  Filled 2019-09-28: qty 1.2

## 2019-09-28 MED ORDER — LIDOCAINE 2% (20 MG/ML) 5 ML SYRINGE
INTRAMUSCULAR | Status: DC | PRN
  Administered 2019-09-28: 60 mg via INTRAVENOUS

## 2019-09-28 MED ORDER — PROPOFOL 10 MG/ML IV BOLUS
INTRAVENOUS | Status: DC | PRN
  Administered 2019-09-28: 100 mg via INTRAVENOUS

## 2019-09-28 MED ORDER — PHENYLEPHRINE HCL (PRESSORS) 10 MG/ML IV SOLN
INTRAVENOUS | Status: DC | PRN
Start: 2019-09-28 — End: 2019-09-28
  Administered 2019-09-28 (×2): 120 ug via INTRAVENOUS

## 2019-09-28 MED ORDER — FENTANYL CITRATE (PF) 250 MCG/5ML IJ SOLN
INTRAMUSCULAR | Status: AC
  Filled 2019-09-28: qty 5

## 2019-09-28 MED ORDER — PHENYLEPHRINE 40 MCG/ML (10ML) SYRINGE FOR IV PUSH (FOR BLOOD PRESSURE SUPPORT)
PREFILLED_SYRINGE | INTRAVENOUS | Status: AC
  Filled 2019-09-28: qty 10

## 2019-09-28 MED ORDER — SODIUM CHLORIDE 0.9 % IV SOLN: INTRAVENOUS | Status: DC

## 2019-09-28 MED ORDER — SODIUM CHLORIDE 0.9 % IV SOLN
INTRAVENOUS | Status: DC | PRN
  Administered 2019-09-28: 500 mL

## 2019-09-28 MED ORDER — PHENYLEPHRINE HCL-NACL 10-0.9 MG/250ML-% IV SOLN
INTRAVENOUS | Status: DC | PRN
  Administered 2019-09-28: 50 ug/min via INTRAVENOUS

## 2019-09-28 MED ORDER — CELECOXIB 200 MG PO CAPS
ORAL_CAPSULE | ORAL | Status: AC
  Filled 2019-09-28: qty 1

## 2019-09-28 MED ORDER — SUCCINYLCHOLINE CHLORIDE 20 MG/ML IJ SOLN
INTRAMUSCULAR | Status: DC | PRN
Start: 2019-09-28 — End: 2019-09-28
  Administered 2019-09-28: 80 mg via INTRAVENOUS

## 2019-09-28 MED ORDER — ONDANSETRON HCL 4 MG/2ML IJ SOLN
INTRAMUSCULAR | Status: DC | PRN
  Administered 2019-09-28: 4 mg via INTRAVENOUS

## 2019-09-28 MED ORDER — GLYCOPYRROLATE PF 0.2 MG/ML IJ SOSY
PREFILLED_SYRINGE | INTRAMUSCULAR | Status: AC
  Filled 2019-09-28: qty 1

## 2019-09-28 MED ORDER — NEOSTIGMINE METHYLSULFATE 3 MG/3ML IV SOSY
PREFILLED_SYRINGE | INTRAVENOUS | Status: AC
  Filled 2019-09-28: qty 3

## 2019-09-28 MED ORDER — CELECOXIB 200 MG PO CAPS
200.0000 mg | ORAL_CAPSULE | Freq: Once | ORAL | Status: AC
  Administered 2019-09-28: 200 mg via ORAL

## 2019-09-28 MED ORDER — DEXTROSE 50 % IV SOLN
12.5000 g | INTRAVENOUS | Status: AC
  Administered 2019-09-28: 12.5 g via INTRAVENOUS
  Filled 2019-09-28: qty 50

## 2019-09-28 MED ORDER — VASOPRESSIN 20 UNIT/ML IV SOLN
INTRAVENOUS | Status: DC | PRN
  Administered 2019-09-28: 2 [IU] via INTRAVENOUS
  Administered 2019-09-28: 4 [IU] via INTRAVENOUS
  Administered 2019-09-28 (×2): 2 [IU] via INTRAVENOUS

## 2019-09-28 MED ORDER — 0.9 % SODIUM CHLORIDE (POUR BTL) OPTIME
TOPICAL | Status: DC | PRN
  Administered 2019-09-28: 1000 mL

## 2019-09-28 MED ORDER — GLYCOPYRROLATE PF 0.2 MG/ML IJ SOSY
PREFILLED_SYRINGE | INTRAMUSCULAR | Status: AC
  Filled 2019-09-28: qty 2

## 2019-09-28 MED ORDER — EPHEDRINE SULFATE 50 MG/ML IJ SOLN
INTRAMUSCULAR | Status: DC | PRN
  Administered 2019-09-28: 15 mg via INTRAVENOUS

## 2019-09-28 MED ORDER — ACETAMINOPHEN 500 MG PO TABS
ORAL_TABLET | ORAL | Status: AC
  Filled 2019-09-28: qty 2

## 2019-09-28 MED ORDER — LIDOCAINE-EPINEPHRINE 0.5 %-1:200000 IJ SOLN
INTRAMUSCULAR | Status: AC
  Filled 2019-09-28: qty 1

## 2019-09-28 MED ORDER — DEXTROSE 50 % IV SOLN
INTRAVENOUS | Status: AC
  Filled 2019-09-28: qty 50

## 2019-09-28 MED ORDER — VASOPRESSIN 20 UNIT/ML IV SOLN
INTRAVENOUS | Status: AC
  Filled 2019-09-28: qty 1

## 2019-09-28 SURGICAL SUPPLY — 32 items
ADH SKN CLS APL DERMABOND .7 (GAUZE/BANDAGES/DRESSINGS) ×2
ARMBAND PINK RESTRICT EXTREMIT (MISCELLANEOUS) ×2 IMPLANT
CANISTER SUCT 3000ML PPV (MISCELLANEOUS) ×2 IMPLANT
CANNULA VESSEL 3MM 2 BLNT TIP (CANNULA) ×2 IMPLANT
CLIP LIGATING EXTRA MED SLVR (CLIP) ×2 IMPLANT
CLIP LIGATING EXTRA SM BLUE (MISCELLANEOUS) ×2 IMPLANT
COVER PROBE W GEL 5X96 (DRAPES) ×2 IMPLANT
COVER WAND RF STERILE (DRAPES) ×2 IMPLANT
DECANTER SPIKE VIAL GLASS SM (MISCELLANEOUS) ×2 IMPLANT
DERMABOND ADVANCED (GAUZE/BANDAGES/DRESSINGS) ×2
DERMABOND ADVANCED .7 DNX12 (GAUZE/BANDAGES/DRESSINGS) ×1 IMPLANT
ELECT REM PT RETURN 9FT ADLT (ELECTROSURGICAL) ×2
ELECTRODE REM PT RTRN 9FT ADLT (ELECTROSURGICAL) ×1 IMPLANT
GLOVE SS BIOGEL STRL SZ 7.5 (GLOVE) ×1 IMPLANT
GLOVE SUPERSENSE BIOGEL SZ 7.5 (GLOVE) ×1
GOWN STRL REUS W/ TWL LRG LVL3 (GOWN DISPOSABLE) ×3 IMPLANT
GOWN STRL REUS W/TWL LRG LVL3 (GOWN DISPOSABLE) ×6
KIT BASIN OR (CUSTOM PROCEDURE TRAY) ×2 IMPLANT
KIT TURNOVER KIT B (KITS) ×2 IMPLANT
NS IRRIG 1000ML POUR BTL (IV SOLUTION) ×2 IMPLANT
PACK CV ACCESS (CUSTOM PROCEDURE TRAY) ×2 IMPLANT
PAD ARMBOARD 7.5X6 YLW CONV (MISCELLANEOUS) ×4 IMPLANT
SLEEVE SURGEON STRL (DRAPES) ×1 IMPLANT
SUT PROLENE 6 0 CC (SUTURE) ×4 IMPLANT
SUT SILK 2 0 SH (SUTURE) ×1 IMPLANT
SUT SILK 3 0 (SUTURE) ×2
SUT SILK 3-0 18XBRD TIE 12 (SUTURE) IMPLANT
SUT VIC AB 3-0 SH 27 (SUTURE) ×4
SUT VIC AB 3-0 SH 27X BRD (SUTURE) ×1 IMPLANT
TOWEL GREEN STERILE (TOWEL DISPOSABLE) ×2 IMPLANT
UNDERPAD 30X30 (UNDERPADS AND DIAPERS) ×2 IMPLANT
WATER STERILE IRR 1000ML POUR (IV SOLUTION) ×2 IMPLANT

## 2019-09-28 NOTE — Progress Notes (Signed)
Blood sugar 64. Pt. Is not symptomatic. Hypoglycemic orders initiated.

## 2019-09-28 NOTE — Interval H&P Note (Signed)
History and Physical Interval Note:  09/28/2019 7:47 AM  Jeffrey [name]  has presented today for surgery, with the diagnosis of END STAGE RENAL DISEASE.  The various methods of treatment have been discussed with the patient and family. After consideration of risks, benefits and other options for treatment, the patient has consented to  Procedure(s): LEFT SECOND STAGE [place] (Left) as a surgical intervention.  The patient's history has been reviewed, patient examined, no change in status, stable for surgery.  I have reviewed the patient's chart and labs.  Questions were answered to the patient's satisfaction.     Curt Jews

## 2019-09-28 NOTE — Discharge Instructions (Signed)
   [facility]  Discharge Instructions  AV Fistula or Graft Surgery for Dialysis Access  Please refer to the following instructions for your post-procedure care. Your surgeon or physician assistant will discuss any changes with you.  Activity  You may drive the day following your surgery, if you are comfortable and no longer taking prescription pain medication. Resume full activity as the soreness in your incision resolves.  Bathing/Showering  You may shower after you go home. Keep your incision dry for 48 hours. Do not soak in a bathtub, hot tub, or swim until the incision heals completely. You may not shower if you have a hemodialysis catheter.  Incision Care  Clean your incision with mild soap and water after 48 hours. Pat the area dry with a clean towel. You do not need a bandage unless otherwise instructed. Do not apply any ointments or creams to your incision. You may have skin glue on your incision. Do not peel it off. It will come off on its own in about one week. Your arm may swell a bit after surgery. To reduce swelling use pillows to elevate your arm so it is above your heart. Your doctor will tell you if you need to lightly wrap your arm with an ACE bandage.  Diet  Resume your normal diet. There are not special food restrictions following this procedure. In order to heal from your surgery, it is CRITICAL to get adequate nutrition. Your body requires vitamins, minerals, and protein. Vegetables are the best source of vitamins and minerals. Vegetables also provide the perfect balance of protein. Processed food has little nutritional value, so try to avoid this.  Medications  Resume taking all of your medications. If your incision is causing pain, you may take over-the counter pain relievers such as acetaminophen (Tylenol). If you were prescribed a stronger pain medication, please be aware these medications can cause nausea and constipation. Prevent  nausea by taking the medication with a snack or meal. Avoid constipation by drinking plenty of fluids and eating foods with high amount of fiber, such as fruits, vegetables, and grains.  Do not take Tylenol if you are taking prescription pain medications.  Follow up Your surgeon may want to see you in the office following your access surgery. If so, this will be arranged at the time of your surgery.  Please call us immediately for any of the following conditions:  . Increased pain, redness, drainage (pus) from your incision site . Fever of 101 degrees or higher . Severe or worsening pain at your incision site . Hand pain or numbness. .  Reduce your risk of vascular disease:  . Stop smoking. If you would like help, call [facility] at 1-800-QUIT-NOW 702 491 0659) or [place] at (223)446-9498  . Manage your cholesterol . Maintain a desired weight . Control your diabetes . Keep your blood pressure down  Dialysis  It will take several weeks to several months for your new dialysis access to be ready for use. Your surgeon will determine when it is okay to use it. Your nephrologist will continue to direct your dialysis. You can continue to use your Permcath until your new access is ready for use.   09/28/2019 PAUL TRETTIN [id number] May 18, 1946  Surgeon(s): Early, Arvilla Meres, MD  Procedure(s): LEFT SECOND STAGE BASILIC VEIN TRANSPOSITION  x Do not stick fistula for 8 weeks    If you have any questions, please call the office at [phone].

## 2019-09-28 NOTE — Progress Notes (Signed)
Pt  States he has recently started using home o2 2lpm

## 2019-09-28 NOTE — Transfer of Care (Signed)
Immediate Anesthesia Transfer of Care Note  Patient: Jeffrey [name]  Procedure(s) Performed: LEFT SECOND STAGE BASCILIC VEIN TRANSPOSITION (Left Arm Upper)  Patient Location: PACU  Anesthesia Type:General  Level of Consciousness: sedated  Airway & Oxygen Therapy: Patient Spontanous Breathing and Patient connected to face mask oxygen  Post-op Assessment: Report given to RN, Post -op Vital signs reviewed and stable and Patient moving all extremities X 4  Post vital signs: Reviewed and stable  Last Vitals:  Vitals Value Taken Time  BP 114/54 09/28/19 1209  Temp    Pulse 80 09/28/19 1209  Resp 12 09/28/19 1209  SpO2 96 % 09/28/19 1209  Vitals shown include unvalidated device data.  Last Pain:  Vitals:   09/28/19 0810  TempSrc:   PainSc: 0-No pain      Patients Stated Pain Goal: 6 (11/73/56 7014)  Complications: No apparent anesthesia complications

## 2019-09-28 NOTE — Anesthesia Procedure Notes (Signed)
Procedure Name: Intubation Date/Time: 09/28/2019 9:56 AM Performed by: Neldon Newport, CRNA Pre-anesthesia Checklist: Timeout performed, Patient being monitored, Suction available, Emergency Drugs available and Patient identified Patient Re-evaluated:Patient Re-evaluated prior to induction Oxygen Delivery Method: Circle system utilized Preoxygenation: Pre-oxygenation with 100% oxygen Induction Type: IV induction and Rapid sequence Ventilation: Mask ventilation without difficulty and Oral airway inserted - appropriate to patient size LMA: LMA inserted LMA Size: 5.0 Laryngoscope Size: Mac and 3 Grade View: Grade I Tube type: Oral Tube size: 7.5 mm Number of attempts: 2 Placement Confirmation: ETT inserted through vocal cords under direct vision,  positive ETCO2 and breath sounds checked- equal and bilateral Secured at: 23 cm Tube secured with: Tape Dental Injury: Teeth and Oropharynx as per pre-operative assessment  Comments: Planned LMA.  Unable to seat LMA sufficiently.  RSI and ETT placed without diff.

## 2019-09-28 NOTE — Op Note (Signed)
    OPERATIVE REPORT  DATE OF SURGERY: 09/28/2019  PATIENT: Jeffrey [name], 74 y.o. male MRN: [id number]  DOB: 21-Dec-1945  PRE-OPERATIVE DIAGNOSIS: End-stage renal disease  POST-OPERATIVE DIAGNOSIS:  Same  PROCEDURE: Left second stage basilic vein transposition  SURGEON:  Curt Jews, M.D.  PHYSICIAN ASSISTANT: Liana Crocker, PA-C  ANESTHESIA: General  EBL: per anesthesia record  Total I/O In: 900.4 [I.V.:800.4; IV Piggyback:100] Out: 20 [Blood:20]  BLOOD ADMINISTERED: none  DRAINS: none  SPECIMEN: none  COUNTS CORRECT:  YES  PATIENT DISPOSITION:  PACU - hemodynamically stable  PROCEDURE DETAILS: Patient was taken operating placed to position where the area of the left arm prepped draped in a sterile fashion.  SonoSite ultrasound was used to visualize the the basilic vein from the antecubital space to the axilla.  The vein was very large.  Incision was made over the prior arteriovenous anastomosis and the brachial artery was exposed proximal distal to the old anastomosis.  Separate incisions were made with a scalpel areas in the skin to expose the basilic vein from the antecubital space to the axilla.  The vein was of excellent size.  Tributary branches were ligated with 3-0 silk ties and divided.  The vein was occluded near the arterial anastomosis and the vein was transected.  The vein was brought out through the tunnel.  The vein was marked to reduce risk of twisting.  A new tunnel was created from the antecubital space and the brachial artery to the axillary incision.  The vein was brought through the tunnel.  The brachial artery was occluded proximally distally and the old anastomosis was taken down.  The vein was cut to the appropriate length and was sewn end-to-side to the artery with a running 6-0 Prolene suture.  Clamps removed and excellent thrill was noted.  The wounds irrigated with saline.  Hemostasis after cautery.  Wounds were closed with 3-0 Vicryl in the subcutaneous  and subcuticular tissue.  Sterile dressing was applied.  The patient did have a palpable radial pulse which augmented with occlusion of the fistula.   [name], M.D., [facility] 09/28/2019 11:56 AM

## 2019-09-28 NOTE — Anesthesia Postprocedure Evaluation (Signed)
Anesthesia Post Note  Patient: Jeffrey [name]  Procedure(s) Performed: LEFT SECOND STAGE BASCILIC VEIN TRANSPOSITION (Left Arm Upper)     Patient location during evaluation: PACU Anesthesia Type: General Level of consciousness: awake and alert Pain management: pain level controlled Vital Signs Assessment: post-procedure vital signs reviewed and stable Respiratory status: spontaneous breathing, nonlabored ventilation, respiratory function stable and patient connected to nasal cannula oxygen Cardiovascular status: blood pressure returned to baseline and stable Postop Assessment: no apparent nausea or vomiting Anesthetic complications: no    Last Vitals:  Vitals:   09/28/19 1215 09/28/19 1224  BP: (!) 114/56 (!) 116/53  Pulse:  83  Resp:  15  Temp:    SpO2:  95%    Last Pain:  Vitals:   09/28/19 1224  TempSrc:   PainSc: Asleep                 Jameison Haji,W. EDMOND

## 2019-09-28 NOTE — Anesthesia Preprocedure Evaluation (Addendum)
Anesthesia Evaluation  Patient identified by MRN, date of birth, ID band Patient awake    Reviewed: Allergy & Precautions, H&P , NPO status , Patient's Chart, lab work & pertinent test results, reviewed documented beta blocker date and time   Airway Mallampati: II  TM Distance: >3 FB Neck ROM: Full    Dental no notable dental hx. (+) Edentulous Upper, Edentulous Lower, Dental Advisory Given   Pulmonary neg pulmonary ROS, former smoker,    Pulmonary exam normal breath sounds clear to auscultation       Cardiovascular hypertension, Pt. on medications and Pt. on home beta blockers  Rhythm:Regular Rate:Normal     Neuro/Psych Anxiety Depression negative neurological ROS     GI/Hepatic Neg liver ROS, PUD, GERD  ,  Endo/Other  diabetes, Insulin DependentMorbid obesity  Renal/GU ESRF and DialysisRenal disease  negative genitourinary   Musculoskeletal   Abdominal   Peds  Hematology  (+) Blood dyscrasia, anemia ,   Anesthesia Other Findings   Reproductive/Obstetrics negative OB ROS                            Anesthesia Physical Anesthesia Plan  ASA: III  Anesthesia Plan: General   Post-op Pain Management:    Induction: Intravenous  PONV Risk Score and Plan: 3 and Ondansetron and Treatment may vary due to age or medical condition  Airway Management Planned: LMA  Additional Equipment:   Intra-op Plan:   Post-operative Plan: Extubation in OR  Informed Consent: I have reviewed the patients History and Physical, chart, labs and discussed the procedure including the risks, benefits and alternatives for the proposed anesthesia with the patient or authorized representative who has indicated his/her understanding and acceptance.     Dental advisory given  Plan Discussed with: CRNA  Anesthesia Plan Comments:         Anesthesia Quick Evaluation

## 2019-09-29 DIAGNOSIS — N186 End stage renal disease: Secondary | ICD-10-CM | POA: Diagnosis not present

## 2019-09-29 DIAGNOSIS — Z992 Dependence on renal dialysis: Secondary | ICD-10-CM | POA: Diagnosis not present

## 2019-10-02 DIAGNOSIS — Z992 Dependence on renal dialysis: Secondary | ICD-10-CM | POA: Diagnosis not present

## 2019-10-02 DIAGNOSIS — E8779 Other fluid overload: Secondary | ICD-10-CM | POA: Diagnosis not present

## 2019-10-02 DIAGNOSIS — N186 End stage renal disease: Secondary | ICD-10-CM | POA: Diagnosis not present

## 2019-10-02 DIAGNOSIS — E878 Other disorders of electrolyte and fluid balance, not elsewhere classified: Secondary | ICD-10-CM | POA: Diagnosis not present

## 2019-10-03 DIAGNOSIS — I8511 Secondary esophageal varices with bleeding: Secondary | ICD-10-CM | POA: Diagnosis not present

## 2019-10-03 DIAGNOSIS — K7290 Hepatic failure, unspecified without coma: Secondary | ICD-10-CM | POA: Diagnosis not present

## 2019-10-03 DIAGNOSIS — R3912 Poor urinary stream: Secondary | ICD-10-CM | POA: Diagnosis not present

## 2019-10-03 DIAGNOSIS — D631 Anemia in chronic kidney disease: Secondary | ICD-10-CM | POA: Diagnosis not present

## 2019-10-03 DIAGNOSIS — K767 Hepatorenal syndrome: Secondary | ICD-10-CM | POA: Diagnosis not present

## 2019-10-03 DIAGNOSIS — K922 Gastrointestinal hemorrhage, unspecified: Secondary | ICD-10-CM | POA: Diagnosis not present

## 2019-10-03 DIAGNOSIS — E1122 Type 2 diabetes mellitus with diabetic chronic kidney disease: Secondary | ICD-10-CM | POA: Diagnosis not present

## 2019-10-03 DIAGNOSIS — R338 Other retention of urine: Secondary | ICD-10-CM | POA: Diagnosis not present

## 2019-10-03 DIAGNOSIS — I10 Essential (primary) hypertension: Secondary | ICD-10-CM | POA: Diagnosis not present

## 2019-10-03 DIAGNOSIS — N186 End stage renal disease: Secondary | ICD-10-CM | POA: Diagnosis not present

## 2019-10-03 DIAGNOSIS — E1142 Type 2 diabetes mellitus with diabetic polyneuropathy: Secondary | ICD-10-CM | POA: Diagnosis not present

## 2019-10-04 DIAGNOSIS — N186 End stage renal disease: Secondary | ICD-10-CM | POA: Diagnosis not present

## 2019-10-04 DIAGNOSIS — K922 Gastrointestinal hemorrhage, unspecified: Secondary | ICD-10-CM | POA: Diagnosis not present

## 2019-10-04 DIAGNOSIS — Z992 Dependence on renal dialysis: Secondary | ICD-10-CM | POA: Diagnosis not present

## 2019-10-04 DIAGNOSIS — E8779 Other fluid overload: Secondary | ICD-10-CM | POA: Diagnosis not present

## 2019-10-05 ENCOUNTER — Other Ambulatory Visit: Payer: Self-pay | Admitting: Physician Assistant

## 2019-10-05 DIAGNOSIS — K7290 Hepatic failure, unspecified without coma: Secondary | ICD-10-CM | POA: Diagnosis not present

## 2019-10-05 DIAGNOSIS — R338 Other retention of urine: Secondary | ICD-10-CM | POA: Diagnosis not present

## 2019-10-05 DIAGNOSIS — E1142 Type 2 diabetes mellitus with diabetic polyneuropathy: Secondary | ICD-10-CM | POA: Diagnosis not present

## 2019-10-05 DIAGNOSIS — E1122 Type 2 diabetes mellitus with diabetic chronic kidney disease: Secondary | ICD-10-CM | POA: Diagnosis not present

## 2019-10-05 DIAGNOSIS — K767 Hepatorenal syndrome: Secondary | ICD-10-CM | POA: Diagnosis not present

## 2019-10-05 DIAGNOSIS — I10 Essential (primary) hypertension: Secondary | ICD-10-CM | POA: Diagnosis not present

## 2019-10-05 DIAGNOSIS — R3912 Poor urinary stream: Secondary | ICD-10-CM | POA: Diagnosis not present

## 2019-10-05 DIAGNOSIS — I8511 Secondary esophageal varices with bleeding: Secondary | ICD-10-CM | POA: Diagnosis not present

## 2019-10-05 DIAGNOSIS — K922 Gastrointestinal hemorrhage, unspecified: Secondary | ICD-10-CM | POA: Diagnosis not present

## 2019-10-05 DIAGNOSIS — D631 Anemia in chronic kidney disease: Secondary | ICD-10-CM | POA: Diagnosis not present

## 2019-10-05 DIAGNOSIS — N186 End stage renal disease: Secondary | ICD-10-CM | POA: Diagnosis not present

## 2019-10-06 DIAGNOSIS — Z992 Dependence on renal dialysis: Secondary | ICD-10-CM | POA: Diagnosis not present

## 2019-10-06 DIAGNOSIS — N186 End stage renal disease: Secondary | ICD-10-CM | POA: Diagnosis not present

## 2019-10-06 DIAGNOSIS — E8779 Other fluid overload: Secondary | ICD-10-CM | POA: Diagnosis not present

## 2019-10-07 DIAGNOSIS — Z992 Dependence on renal dialysis: Secondary | ICD-10-CM | POA: Diagnosis not present

## 2019-10-07 DIAGNOSIS — E8779 Other fluid overload: Secondary | ICD-10-CM | POA: Diagnosis not present

## 2019-10-07 DIAGNOSIS — N186 End stage renal disease: Secondary | ICD-10-CM | POA: Diagnosis not present

## 2019-10-09 DIAGNOSIS — E878 Other disorders of electrolyte and fluid balance, not elsewhere classified: Secondary | ICD-10-CM | POA: Diagnosis not present

## 2019-10-09 DIAGNOSIS — Z992 Dependence on renal dialysis: Secondary | ICD-10-CM | POA: Diagnosis not present

## 2019-10-09 DIAGNOSIS — E8779 Other fluid overload: Secondary | ICD-10-CM | POA: Diagnosis not present

## 2019-10-09 DIAGNOSIS — N186 End stage renal disease: Secondary | ICD-10-CM | POA: Diagnosis not present

## 2019-10-09 DIAGNOSIS — N2581 Secondary hyperparathyroidism of renal origin: Secondary | ICD-10-CM | POA: Diagnosis not present

## 2019-10-10 ENCOUNTER — Encounter: Payer: Self-pay | Admitting: Gastroenterology

## 2019-10-10 ENCOUNTER — Other Ambulatory Visit: Payer: Self-pay

## 2019-10-10 ENCOUNTER — Encounter: Payer: Self-pay | Admitting: Internal Medicine

## 2019-10-10 ENCOUNTER — Telehealth (INDEPENDENT_AMBULATORY_CARE_PROVIDER_SITE_OTHER): Payer: Medicare Other | Admitting: Gastroenterology

## 2019-10-10 DIAGNOSIS — R197 Diarrhea, unspecified: Secondary | ICD-10-CM | POA: Diagnosis not present

## 2019-10-10 DIAGNOSIS — K7290 Hepatic failure, unspecified without coma: Secondary | ICD-10-CM | POA: Diagnosis not present

## 2019-10-10 DIAGNOSIS — N186 End stage renal disease: Secondary | ICD-10-CM | POA: Diagnosis not present

## 2019-10-10 DIAGNOSIS — K7031 Alcoholic cirrhosis of liver with ascites: Secondary | ICD-10-CM | POA: Diagnosis not present

## 2019-10-10 DIAGNOSIS — I10 Essential (primary) hypertension: Secondary | ICD-10-CM | POA: Diagnosis not present

## 2019-10-10 DIAGNOSIS — D631 Anemia in chronic kidney disease: Secondary | ICD-10-CM | POA: Diagnosis not present

## 2019-10-10 DIAGNOSIS — K922 Gastrointestinal hemorrhage, unspecified: Secondary | ICD-10-CM

## 2019-10-10 DIAGNOSIS — E1142 Type 2 diabetes mellitus with diabetic polyneuropathy: Secondary | ICD-10-CM | POA: Diagnosis not present

## 2019-10-10 DIAGNOSIS — R3912 Poor urinary stream: Secondary | ICD-10-CM | POA: Diagnosis not present

## 2019-10-10 DIAGNOSIS — E1122 Type 2 diabetes mellitus with diabetic chronic kidney disease: Secondary | ICD-10-CM | POA: Diagnosis not present

## 2019-10-10 DIAGNOSIS — I8511 Secondary esophageal varices with bleeding: Secondary | ICD-10-CM | POA: Diagnosis not present

## 2019-10-10 DIAGNOSIS — R338 Other retention of urine: Secondary | ICD-10-CM | POA: Diagnosis not present

## 2019-10-10 DIAGNOSIS — K767 Hepatorenal syndrome: Secondary | ICD-10-CM | POA: Diagnosis not present

## 2019-10-10 NOTE — Patient Instructions (Addendum)
1. Please stop vancomycin. You can continue the Questran powder. Let me know if your diarrhea worsens or persists. If it does, we may recheck you for Cdiff again.  2. Continue omeprazole and Xifaxan as before.  3. We will request copy of most recent labs from dialysis so we can keep check on your hemoglobin.  4. We will see you back in office in a few weeks. I don't see where you are scheduled to come back in June (likely was moved when we made this appointment today) but I will have the office make an appointment.

## 2019-10-10 NOTE — Progress Notes (Signed)
Primary Care Physician:  Monico Blitz, MD Primary GI:  Garfield Cornea, MD    Patient Location: Home  Provider Location: [facility] office  Reason for Visit:  Chief Complaint  Patient presents with  . Cirrhosis    f/u  . Anemia    f/u. hosp admit 09/18/19. denies any RB, no dark stools per pt.    Persons present on the virtual encounter, with roles: Patient, myself (provider),Mindy Estudillo CMA(updated meds and allergies)  Total time (minutes) spent on medical discussion: 25 minutes  Due to COVID-19, visit was conducted using MyChart Video method.  Visit was requested by patient.  Virtual Visit via MyChart Video  I connected with Jeffrey [name] on 10/10/19 at  9:30 AM EDT by Mychart Video and verified that I am speaking with the correct person using two identifiers.   I discussed the limitations, risks, security and privacy concerns of performing an evaluation and management service by telephone/video and the availability of in person appointments. I also discussed with the patient that there may be a patient responsible charge related to this service. The patient expressed understanding and agreed to proceed.   HPI:   Jeffrey [name] is a 74 y.o. male who presents for virtual visit regarding hospital follow-up.  He was seen in the hospital last month after presenting for a hemoglobin of 5.4 detected at time of dialysis.  Heme positive stool. Reported previous melena for a few weeks with the last black stool prior to admission.  Had been hospitalized in January/February 2021 for GI bleeding at an outside facility and received total of 7 units of packed red blood cells, EGD completed while inpatient in January showed small esophageal varices without high risk stigmata, single large AVM on lesser curvature in gastric body status post APC ablation, gastric antral and duodenal bulb polyposis.  Colonoscopy with portal colopathy and diffuse edema, changes of C. difficile colitis on colonoscopy.   Felt to have developing hepatorenal syndrome and permanent cath placed, started dialysis while inpatient.  Has required multiple paracenteses while inpatient as well.  C. difficile colitis December 2021, was refractory to vanc.  He completed 20-day course of Dificid.  Repeat C. difficile positive in February, started on oral vanc with taper.  No evidence of diarrhea during admission last month.  EGD 09/19/19: Grade I and grade II esophageal varices with no stigmata of recent bleeding. - MILD Portal hypertensive gastropathy. - MODERATE Gastritis. Biopsied. (chronic inactive gastritis, no H.pylori) - A single gastric polyp. Resected and retrieved. (hyperplastic polyps) - NO OBVOUS SOURCE FOR MELENA/TRANSFUSION DEPENDENT ANEMIA IDENTIFIED. MAY BE DUE TO FRIABLE GASTRIC AND DUODENAL MUCOSA IN SETTING OF PORTAL HYPERTENSION  Small bowel Givens capsule on September 22, 2019, poor study with a lot of debris obstructing much of the view of the first approximate 3 hours out of 6 hours.  No obvious source of bleeding.  Sequelae of bleeding and old blood seen in the form of "whisps" of blood, blood flecks mostly toward the end of the study.   Right upper quadrant ultrasound April 2021 with cirrhosis but no hepatic masses. MELD Na of 23 on dialysis, Child Pugh B.   Doing well. Still on oxygen,he is not sure why he still needs it. Last labs were while in dialysis. 4/28, Hgb 8.4, A1C 5.8 on last check per wife. No melena, brbpr. BM 2-3 per day often loose. Sometimes just a small amount. Some days no stools. Hard place in lower abdomen. Feels like  piece of leather. At the belt area. There for months. No red or tender. Will watch for red or tenderness. No confusion. Still taking vanc about once per day but often forgets. Wasn't sure when to stop. Has not been consistent with it and should have been out awhile ago. States his loose stools are nothing like when he had Cdiff. Overall much better. Still taking questran once  daily. Last dose of vanc was Sunday. Chronically his legs swollen and all the way up to waist. Still with catheter. Has an appt with urologist in few weeks. Fistula operation last week in the left arm. On lasix and aldactone as outlined. We have not managed since going on dialysis earlier in the year.   Current Outpatient Medications  Medication Sig Dispense Refill  . Ascorbic Acid (VITAMIN C) 1000 MG tablet Take 1,000 mg by mouth 2 (two) times daily.    . cholestyramine (QUESTRAN) 4 GM/DOSE powder Take 4 g by mouth in the morning.     . diazepam (VALIUM) 5 MG tablet Take 5 mg by mouth daily as needed for anxiety.     . furosemide (LASIX) 80 MG tablet Take 80 mg by mouth in the morning.     . gabapentin (NEURONTIN) 100 MG capsule TAKE 1 CAPSULE BY MOUTH AT BEDTIME 90 capsule 0  . HYDROcodone-acetaminophen (NORCO) 10-325 MG tablet Take 1 tablet by mouth every 6 (six) hours as needed for moderate pain. 20 tablet 0  . hydrOXYzine (VISTARIL) 25 MG capsule Take 25 mg by mouth 3 (three) times daily as needed for itching.     . insulin degludec (TRESIBA FLEXTOUCH) 200 UNIT/ML FlexTouch Pen Inject 120 Units into the skin at bedtime.    . insulin lispro (HUMALOG) 100 UNIT/ML KiwkPen Inject 50 Units into the skin 3 (three) times daily.     . iron polysaccharides (NIFEREX) 150 MG capsule Take 150 mg by mouth at bedtime.     . metoprolol tartrate (LOPRESSOR) 25 MG tablet Take 25 mg by mouth 2 (two) times daily.    . milk thistle 175 MG tablet Take 175 mg by mouth 3 (three) times daily.     . mirtazapine (REMERON) 15 MG tablet Take 15 mg by mouth at bedtime.    . Multiple Vitamin (MULTIVITAMIN WITH MINERALS) TABS tablet Take 1 tablet by mouth in the morning.     [name] omega-3 acid ethyl esters (LOVAZA) 1 g capsule Take 2 g by mouth 2 (two) times daily.     [name] omeprazole (PRILOSEC) 40 MG capsule Take 40 mg by mouth in the morning.     . pravastatin (PRAVACHOL) 20 MG tablet Take 20 mg by mouth in the morning.     .  Probiotic Product (PROBIOTIC DAILY PO) Take 1 tablet by mouth in the morning.     . rifaximin (XIFAXAN) 550 MG TABS tablet Take 1 tablet (550 mg total) by mouth 2 (two) times daily. 60 tablet 11  . spironolactone (ALDACTONE) 50 MG tablet Take 50 mg by mouth in the morning.     . tamsulosin (FLOMAX) 0.4 MG CAPS capsule Take 0.4 mg by mouth every evening.    . thiamine 100 MG tablet Take 100 mg by mouth in the morning. Vitamin B-1    . tiZANidine (ZANAFLEX) 2 MG tablet Take 2 mg by mouth every 6 (six) hours as needed for muscle spasms.     [name] triamcinolone cream (KENALOG) 0.1 % Apply 1 application topically 2 (two) times daily as  needed (dry skin Itching).   3  . Vancomycin HCl 25 MG/ML SOLR Take 125 mg by mouth See admin instructions. Starting on 08/27/2019 take 108mls by mouth 4 times daily for 2 weeks, then take 80mls three times daily for 2 weeks, then take 5 mls twice daily as directed    . venlafaxine (EFFEXOR) 75 MG tablet Take 75 mg by mouth 3 (three) times daily with meals.      No current facility-administered medications for this visit.    Past Medical History:  Diagnosis Date  . A-fib [facility])    when initially stating dialysis january 2021 at [facility] per spouse; never heard anything else about it  . Alcoholic cirrhosis ([place])    patient reports completing hep a and b vaccines in 2006  . Anemia    has had 3 units of prbcs 2013  . Anxiety   . Asymptomatic gallstones    Ultrasound in 2006  . Cholelithiasis   . Chronic kidney disease    Dialysis M/W/F/Sa  . Depression   . Diabetes ([place])   . Duodenal ulcer with hemorrhage    per patient in 2006 or 2007, records have been requested  . Dyspnea    low oxygen level -  has O2 2 L all day  . 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 GERD (gastroesophageal reflux disease)   . 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 HOH (hard of hearing)   . Hypertension   . Splenomegaly    Ultrasound in 2006  . Tubular adenoma of colon      Past Surgical History:  Procedure Laterality Date  . AGILE CAPSULE N/A 09/20/2019   Procedure: AGILE CAPSULE;  Surgeon: Daneil Dolin, MD;  [facility];  Service: Endoscopy;  Laterality: N/A;  . APPENDECTOMY    . AV FISTULA PLACEMENT Left 08/17/2019   Procedure: ARTERIOVENOUS (AV) FISTULA CREATION VERSES ARTERIOVENOUS GRAFT;  Surgeon: [name], MD;  Location: [place];  Service: Vascular;  Laterality: Left;  . BASCILIC VEIN TRANSPOSITION Left 09/28/2019   Procedure: LEFT SECOND STAGE BASCILIC VEIN TRANSPOSITION;  Surgeon: [name], MD;  Location: [place];  Service: Vascular;  Laterality: Left;  . BIOPSY  02/03/2018   Procedure: BIOPSY;  Surgeon: Daneil Dolin, MD;  [facility];  Service: Endoscopy;;  bx of gastric polyps  . BIOPSY  09/19/2019   Procedure: BIOPSY;  Surgeon: Danie Binder, MD;  [facility];  Service: Endoscopy;;  . CATARACT EXTRACTION Right   . COLONOSCOPY  03/10/2005   Rectal polyp as described above, removed with snare. Left sided  diverticula. The remainder of the colonic mucosa appeared normal. Inflammed polyp on path.  . COLONOSCOPY  04/1999   Dr. Thea Silversmith polyps removed,  Path showed hyperplastic  . COLONOSCOPY  10/2015   Dr. Britta Mccreedy: diverticulosis, single sessile tubular adenoma 3-32mm in size removed from descending colon.   . COLONOSCOPY WITH ESOPHAGOGASTRODUODENOSCOPY (EGD)  03/31/2012   LDJ:TTSVXBL AVMs. Colonic diverticulosis. tubular adenoma colon  . COLONOSCOPY WITH ESOPHAGOGASTRODUODENOSCOPY (EGD)  06/2019   FORSYTH: small esophageal varices without high risk stigmata, single large AVM on lesser curvature in gastric body s/p APC ablation, gastric antral and duodenal bulb polyposis. No significant source to explain transfusion dependent anemia. Colonoscopy with portal colopathy and diffuse edema, changes of Cdiff colitis on colonoscopy  . ESOPHAGEAL BANDING N/A 02/03/2018   Procedure: ESOPHAGEAL BANDING;  Surgeon:  Manus Rudd  M, MD;  [facility];  Service: Endoscopy;  Laterality: N/A;  . ESOPHAGOGASTRODUODENOSCOPY  01/15/2005   Three columns grade 1 to 2 esophageal varices, otherwise normal esophageal mucosa.  Esophagus was not manipulated otherwise./Nodularity of the antrum with overlying erosions, nonspecific finding. Path showed rare H.pylori  . ESOPHAGOGASTRODUODENOSCOPY  09/2008   Dr. Gaylene Brooks columns of grade 2-3 esoph varices, only one column was prominent. Portal gastropathy, multiple gastrc polyps at antrum, two were 2cm with black eschar, bulbar polyps, bulbar erosions  . ESOPHAGOGASTRODUODENOSCOPY  11/2004   Dr. Brantley Stage 3 esoph varices  . ESOPHAGOGASTRODUODENOSCOPY  03/31/2012   RMR: 4 columns(3-GR 2, 1-GR1) non-bleeding esophageal varices, portal gastropathy, small HH, early GAVE, multiple gastric polyps   . ESOPHAGOGASTRODUODENOSCOPY (EGD) WITH PROPOFOL N/A 02/03/2018   Dr. Gala Romney: Esophageal varices, 3 columns grade 1-2.  Portal hypertensive gastropathy.  Multiple gastric polyps, biopsy consistent with hyperplastic.  [name] ESOPHAGOGASTRODUODENOSCOPY (EGD) WITH PROPOFOL N/A 09/19/2019   Procedure: ESOPHAGOGASTRODUODENOSCOPY (EGD) WITH PROPOFOL;  Surgeon: Danie Binder, MD;  [facility];  Service: Endoscopy;  Laterality: N/A;  . GASTRIC VARICES BANDING  03/31/2012   Procedure: GASTRIC VARICES BANDING;  Surgeon: Daneil Dolin, MD;  [facility];  Service: Endoscopy;  Laterality: N/A;  . GIVENS CAPSULE STUDY N/A 09/21/2019   Procedure: GIVENS CAPSULE STUDY;  Surgeon: Daneil Dolin, MD;  [facility];  Service: Endoscopy;  Laterality: N/A;  . POLYPECTOMY  09/19/2019   Procedure: POLYPECTOMY;  Surgeon: Danie Binder, MD;  [facility];  Service: Endoscopy;;  . UMBILICAL HERNIA REPAIR  2017   exploratory laparotomy, abdominal washout    Family History  Problem Relation Age of Onset  . Colon cancer Father 38       deceased age  65  . Breast cancer Sister        deceased  . Breast cancer Sister   . Stroke Mother   . Lung cancer Neg Hx   . Ovarian cancer Neg Hx     Social History   Socioeconomic History  . Marital status: Married    Spouse name: Not on file  . Number of children: 2  . Years of education: Not on file  . Highest education level: Not on file  Occupational History  . Occupation: Disabled    Employer: SELF EMPLOYED  Tobacco Use  . Smoking status: Former Smoker    Packs/day: 0.50    Years: 25.00    Pack years: 12.50    Types: Cigarettes    Quit date: 01/28/1986    Years since quitting: 33.7  . Smokeless tobacco: Never Used  Substance and Sexual Activity  . Alcohol use: No    Comment: quit in 05/2013  . Drug use: No  . Sexual activity: Not Currently  Other Topics Concern  . Not on file  Social History Narrative   Two step children from second marriage (divorced) who live with him along with some grandchildren.    Social Determinants of Health   Financial Resource Strain:   . Difficulty of Paying Living Expenses:   Food Insecurity:   . Worried About Charity fundraiser in the Last Year:   . Arboriculturist in the Last Year:   Transportation Needs:   . Film/video editor (Medical):   [name] Lack of Transportation (Non-Medical):   Physical Activity:   . Days of Exercise per Week:   . Minutes of Exercise per Session:   Stress:   .  Feeling of Stress :   Social Connections:   . Frequency of Communication with Friends and Family:   . Frequency of Social Gatherings with Friends and Family:   . Attends Religious Services:   . Active Member of Clubs or Organizations:   . Attends Archivist Meetings:   [name] Marital Status:   Intimate Partner Violence:   . Fear of Current or Ex-Partner:   . Emotionally Abused:   [name] Physically Abused:   . Sexually Abused:       ROS:  General: Negative for anorexia, weight loss, fever, chills, fatigue, weakness. Eyes: Negative for vision  changes.  ENT: Negative for hoarseness, difficulty swallowing , nasal congestion. CV: Negative for chest pain, angina, palpitations, dyspnea on exertion,+ peripheral edema.  Respiratory: Negative for dyspnea at rest, dyspnea on exertion, cough, sputum, wheezing.  GI: See history of present illness. GU:  Negative for dysuria, hematuria, urinary incontinence, urinary frequency, nocturnal urination. See hpi MS: Negative for joint pain, low back pain.  Derm: Negative for rash or itching.  Neuro: Negative for weakness, abnormal sensation, seizure, frequent headaches, memory loss, confusion.  Psych: Negative for anxiety, depression, suicidal ideation, hallucinations.  Endo: Negative for unusual weight change.  Heme: Negative for bruising or bleeding. Allergy: Negative for rash or hives.   Observations/Objective:  Lab Results  Component Value Date   CREATININE 2.30 (H) 09/28/2019   BUN 15 09/28/2019   NA 138 09/28/2019   K 3.4 (L) 09/28/2019   CL 98 09/28/2019   CO2 25 09/22/2019   Lab Results  Component Value Date   WBC 3.6 (L) 09/22/2019   HGB 9.9 (L) 09/28/2019   HCT 29.0 (L) 09/28/2019   MCV 98.6 09/22/2019   PLT 113 (L) 09/22/2019   Lab Results  Component Value Date   IRON 50 09/19/2019   TIBC 418 09/19/2019   FERRITIN 105 09/19/2019   No results found for: VITAMINB12 Lab Results  Component Value Date   FOLATE 15.4 09/19/2019   Lab Results  Component Value Date   ALT 20 09/20/2019   AST 30 09/20/2019   ALKPHOS 97 09/20/2019   BILITOT 1.1 09/20/2019   Lab Results  Component Value Date   INR 1.2 09/19/2019   INR 1.2 09/18/2019   INR 1.1 09/07/2019    Assessment and Plan: Pleasant 74 y/o male with h/o cirrhosis due to prior etoh abuse, on dialysis since earlier this year (possible due to hepatorenal syndrome), h/o GI bleeding, h/o Cdiff colitis here for follow up.   Cirrhosis: hepatoma screening up to date. MELD Na 23 in setting of dialysis, as inpatient. No  recent hepatic encephalopathy. Remains on Xifaxan. Complains of peripheral edema mostly in lower extremities. On lasix 80mg  and aldactone 50mg  daily. Hesitant to adjust diuretic through our office given need for hemodialysis. Recommend he address with his nephrologist. Will request most recent labs. Will have him come in for face to face visit next month.   Anemia, transfusion dependent. Work up as outlined above including two EGDs, colonoscopy, and capsule endoscopy (somewhat limited). Will continue to follow H/H. Consider iron infusions/blood transfusion as needed. Will f/u on recent labs. May require hematology referral for ongoing management. Continue omeprazole as before.   Cdiff: was on prolonged vanc taper but patient was not taking regularly and should have already completed course (late March). Still on questran. Overall bowels much improved. Will have him stop vanc. If worsening diarrhea, he will let us know. Otherwise will see him back next  month for face to face encounter. Can continue questran if needed but avoid constipation.   Follow Up Instructions:    I discussed the assessment and treatment plan with the patient. The patient was provided an opportunity to ask questions and all were answered. The patient agreed with the plan and demonstrated an understanding of the instructions. AVS mailed to patient's home address.   The patient was advised to call back or seek an in-person evaluation if the symptoms worsen or if the condition fails to improve as anticipated.  I provided 25 minutes of virtual face-to-face time during this encounter.   Neil Crouch, PA-C

## 2019-10-11 ENCOUNTER — Ambulatory Visit: Payer: Medicare Other | Admitting: Gastroenterology

## 2019-10-11 DIAGNOSIS — Z992 Dependence on renal dialysis: Secondary | ICD-10-CM | POA: Diagnosis not present

## 2019-10-11 DIAGNOSIS — E8779 Other fluid overload: Secondary | ICD-10-CM | POA: Diagnosis not present

## 2019-10-11 DIAGNOSIS — N186 End stage renal disease: Secondary | ICD-10-CM | POA: Diagnosis not present

## 2019-10-12 DIAGNOSIS — K922 Gastrointestinal hemorrhage, unspecified: Secondary | ICD-10-CM | POA: Diagnosis not present

## 2019-10-12 DIAGNOSIS — K7290 Hepatic failure, unspecified without coma: Secondary | ICD-10-CM | POA: Diagnosis not present

## 2019-10-12 DIAGNOSIS — E1142 Type 2 diabetes mellitus with diabetic polyneuropathy: Secondary | ICD-10-CM | POA: Diagnosis not present

## 2019-10-12 DIAGNOSIS — K767 Hepatorenal syndrome: Secondary | ICD-10-CM | POA: Diagnosis not present

## 2019-10-12 DIAGNOSIS — I10 Essential (primary) hypertension: Secondary | ICD-10-CM | POA: Diagnosis not present

## 2019-10-12 DIAGNOSIS — E1122 Type 2 diabetes mellitus with diabetic chronic kidney disease: Secondary | ICD-10-CM | POA: Diagnosis not present

## 2019-10-12 DIAGNOSIS — I8511 Secondary esophageal varices with bleeding: Secondary | ICD-10-CM | POA: Diagnosis not present

## 2019-10-12 DIAGNOSIS — R338 Other retention of urine: Secondary | ICD-10-CM | POA: Diagnosis not present

## 2019-10-12 DIAGNOSIS — D631 Anemia in chronic kidney disease: Secondary | ICD-10-CM | POA: Diagnosis not present

## 2019-10-12 DIAGNOSIS — R3912 Poor urinary stream: Secondary | ICD-10-CM | POA: Diagnosis not present

## 2019-10-12 DIAGNOSIS — N186 End stage renal disease: Secondary | ICD-10-CM | POA: Diagnosis not present

## 2019-10-13 DIAGNOSIS — Z992 Dependence on renal dialysis: Secondary | ICD-10-CM | POA: Diagnosis not present

## 2019-10-13 DIAGNOSIS — E8779 Other fluid overload: Secondary | ICD-10-CM | POA: Diagnosis not present

## 2019-10-13 DIAGNOSIS — N186 End stage renal disease: Secondary | ICD-10-CM | POA: Diagnosis not present

## 2019-10-14 DIAGNOSIS — E8779 Other fluid overload: Secondary | ICD-10-CM | POA: Diagnosis not present

## 2019-10-14 DIAGNOSIS — N186 End stage renal disease: Secondary | ICD-10-CM | POA: Diagnosis not present

## 2019-10-14 DIAGNOSIS — Z992 Dependence on renal dialysis: Secondary | ICD-10-CM | POA: Diagnosis not present

## 2019-10-16 ENCOUNTER — Telehealth: Payer: Self-pay | Admitting: Gastroenterology

## 2019-10-16 DIAGNOSIS — N186 End stage renal disease: Secondary | ICD-10-CM | POA: Diagnosis not present

## 2019-10-16 DIAGNOSIS — Z992 Dependence on renal dialysis: Secondary | ICD-10-CM | POA: Diagnosis not present

## 2019-10-16 DIAGNOSIS — E8779 Other fluid overload: Secondary | ICD-10-CM | POA: Diagnosis not present

## 2019-10-16 DIAGNOSIS — E878 Other disorders of electrolyte and fluid balance, not elsewhere classified: Secondary | ICD-10-CM | POA: Diagnosis not present

## 2019-10-16 NOTE — Telephone Encounter (Signed)
PATIENT ON RECALL  °

## 2019-10-16 NOTE — Telephone Encounter (Signed)
Patient had incomplete colonoscopy in [place] earlier this year with poor prep, polyps left.   Please nic for possible colonoscopy in 07/2020

## 2019-10-17 DIAGNOSIS — K922 Gastrointestinal hemorrhage, unspecified: Secondary | ICD-10-CM | POA: Diagnosis not present

## 2019-10-17 DIAGNOSIS — I8511 Secondary esophageal varices with bleeding: Secondary | ICD-10-CM | POA: Diagnosis not present

## 2019-10-17 DIAGNOSIS — R3912 Poor urinary stream: Secondary | ICD-10-CM | POA: Diagnosis not present

## 2019-10-17 DIAGNOSIS — K767 Hepatorenal syndrome: Secondary | ICD-10-CM | POA: Diagnosis not present

## 2019-10-17 DIAGNOSIS — D631 Anemia in chronic kidney disease: Secondary | ICD-10-CM | POA: Diagnosis not present

## 2019-10-17 DIAGNOSIS — E1142 Type 2 diabetes mellitus with diabetic polyneuropathy: Secondary | ICD-10-CM | POA: Diagnosis not present

## 2019-10-17 DIAGNOSIS — N186 End stage renal disease: Secondary | ICD-10-CM | POA: Diagnosis not present

## 2019-10-17 DIAGNOSIS — R338 Other retention of urine: Secondary | ICD-10-CM | POA: Diagnosis not present

## 2019-10-17 DIAGNOSIS — E1122 Type 2 diabetes mellitus with diabetic chronic kidney disease: Secondary | ICD-10-CM | POA: Diagnosis not present

## 2019-10-17 DIAGNOSIS — K7290 Hepatic failure, unspecified without coma: Secondary | ICD-10-CM | POA: Diagnosis not present

## 2019-10-17 DIAGNOSIS — I10 Essential (primary) hypertension: Secondary | ICD-10-CM | POA: Diagnosis not present

## 2019-10-18 DIAGNOSIS — E8779 Other fluid overload: Secondary | ICD-10-CM | POA: Diagnosis not present

## 2019-10-18 DIAGNOSIS — Z992 Dependence on renal dialysis: Secondary | ICD-10-CM | POA: Diagnosis not present

## 2019-10-18 DIAGNOSIS — N186 End stage renal disease: Secondary | ICD-10-CM | POA: Diagnosis not present

## 2019-10-19 DIAGNOSIS — E1122 Type 2 diabetes mellitus with diabetic chronic kidney disease: Secondary | ICD-10-CM | POA: Diagnosis not present

## 2019-10-19 DIAGNOSIS — R338 Other retention of urine: Secondary | ICD-10-CM | POA: Diagnosis not present

## 2019-10-19 DIAGNOSIS — K7290 Hepatic failure, unspecified without coma: Secondary | ICD-10-CM | POA: Diagnosis not present

## 2019-10-19 DIAGNOSIS — I8511 Secondary esophageal varices with bleeding: Secondary | ICD-10-CM | POA: Diagnosis not present

## 2019-10-19 DIAGNOSIS — R3912 Poor urinary stream: Secondary | ICD-10-CM | POA: Diagnosis not present

## 2019-10-19 DIAGNOSIS — N186 End stage renal disease: Secondary | ICD-10-CM | POA: Diagnosis not present

## 2019-10-19 DIAGNOSIS — K922 Gastrointestinal hemorrhage, unspecified: Secondary | ICD-10-CM | POA: Diagnosis not present

## 2019-10-19 DIAGNOSIS — E1142 Type 2 diabetes mellitus with diabetic polyneuropathy: Secondary | ICD-10-CM | POA: Diagnosis not present

## 2019-10-19 DIAGNOSIS — I10 Essential (primary) hypertension: Secondary | ICD-10-CM | POA: Diagnosis not present

## 2019-10-19 DIAGNOSIS — D631 Anemia in chronic kidney disease: Secondary | ICD-10-CM | POA: Diagnosis not present

## 2019-10-19 DIAGNOSIS — K767 Hepatorenal syndrome: Secondary | ICD-10-CM | POA: Diagnosis not present

## 2019-10-20 DIAGNOSIS — Z992 Dependence on renal dialysis: Secondary | ICD-10-CM | POA: Diagnosis not present

## 2019-10-20 DIAGNOSIS — E8779 Other fluid overload: Secondary | ICD-10-CM | POA: Diagnosis not present

## 2019-10-20 DIAGNOSIS — N186 End stage renal disease: Secondary | ICD-10-CM | POA: Diagnosis not present

## 2019-10-23 ENCOUNTER — Telehealth: Payer: Self-pay | Admitting: Internal Medicine

## 2019-10-23 DIAGNOSIS — I509 Heart failure, unspecified: Secondary | ICD-10-CM | POA: Diagnosis not present

## 2019-10-23 DIAGNOSIS — R197 Diarrhea, unspecified: Secondary | ICD-10-CM | POA: Diagnosis not present

## 2019-10-23 DIAGNOSIS — I10 Essential (primary) hypertension: Secondary | ICD-10-CM | POA: Diagnosis not present

## 2019-10-23 DIAGNOSIS — Z299 Encounter for prophylactic measures, unspecified: Secondary | ICD-10-CM | POA: Diagnosis not present

## 2019-10-23 NOTE — Telephone Encounter (Signed)
Send stool for Cdiff [facility]. I have placed orders. This test is only done at [facility] and need to be able to differentiate from active Cdiff and potential carrier state.

## 2019-10-23 NOTE — Telephone Encounter (Signed)
Mrs Kentner called doctor Shaws office while waiting on a call back from our office (I was at lunch from 1p-2p) Received order from his office for cdiff test. Asked wife if they would have doctor shaws office fax the results over once they receive them

## 2019-10-23 NOTE — Addendum Note (Signed)
Addended by: Mahala Menghini on: 10/23/2019 12:29 PM   Modules accepted: Orders

## 2019-10-23 NOTE — Telephone Encounter (Signed)
Spoke to mrs Noguchi's pt wife. She stated pt has been having diarrhea since 5/22 she states he is having some abd and left flank pain, states pt was on vancomycin for cdiff but d/c it on 5/11. States mr mckesson is not having any blood in his stool, fever or vomiting but she thinks his cdiff has came back.  Wife would like pt to be retested

## 2019-10-23 NOTE — Telephone Encounter (Signed)
PATIENT WIFE RETURNED CALL, PLEASE CALL BACK

## 2019-10-23 NOTE — Telephone Encounter (Signed)
Called verified name and dob Pt wanted to know if her can use an expired stool card Notified him that  an expired card will give a false result and that he would need to pick up another card from our office Pt stated he understood and that him or his wife would

## 2019-10-23 NOTE — Telephone Encounter (Signed)
Please call patient's wife regarding his stool sample. [phone]

## 2019-10-24 DIAGNOSIS — R197 Diarrhea, unspecified: Secondary | ICD-10-CM | POA: Diagnosis not present

## 2019-10-25 ENCOUNTER — Telehealth: Payer: Self-pay | Admitting: *Deleted

## 2019-10-25 NOTE — Telephone Encounter (Signed)
Jeffrey [name], you are scheduled for a virtual visit with your provider today.  Just as we do with appointments in the office, we must obtain your consent to participate.  Your consent will be active for this visit and any virtual visit you may have with one of our providers in the next 365 days.  If you have a MyChart account, I can also send a copy of this consent to you electronically.  All virtual visits are billed to your insurance company just like a traditional visit in the office.  As this is a virtual visit, video technology does not allow for your provider to perform a traditional examination.  This may limit your provider's ability to fully assess your condition.  If your provider identifies any concerns that need to be evaluated in person or the need to arrange testing such as labs, EKG, etc, we will make arrangements to do so.  Although advances in technology are sophisticated, we cannot ensure that it will always work on either your end or our end.  If the connection with a video visit is poor, we may have to switch to a telephone visit.  With either a video or telephone visit, we are not always able to ensure that we have a secure connection.   I need to obtain your verbal consent now.   Are you willing to proceed with your visit today?  ?

## 2019-10-25 NOTE — Telephone Encounter (Signed)
Pt consented to a virtual visit on 10/10/19.

## 2019-10-26 DIAGNOSIS — R338 Other retention of urine: Secondary | ICD-10-CM | POA: Diagnosis not present

## 2019-10-26 DIAGNOSIS — I509 Heart failure, unspecified: Secondary | ICD-10-CM | POA: Diagnosis not present

## 2019-10-26 DIAGNOSIS — R3912 Poor urinary stream: Secondary | ICD-10-CM | POA: Diagnosis not present

## 2019-10-26 DIAGNOSIS — E1142 Type 2 diabetes mellitus with diabetic polyneuropathy: Secondary | ICD-10-CM | POA: Diagnosis not present

## 2019-10-26 DIAGNOSIS — K7290 Hepatic failure, unspecified without coma: Secondary | ICD-10-CM | POA: Diagnosis not present

## 2019-10-26 DIAGNOSIS — N186 End stage renal disease: Secondary | ICD-10-CM | POA: Diagnosis not present

## 2019-10-26 DIAGNOSIS — E1122 Type 2 diabetes mellitus with diabetic chronic kidney disease: Secondary | ICD-10-CM | POA: Diagnosis not present

## 2019-10-26 DIAGNOSIS — R0602 Shortness of breath: Secondary | ICD-10-CM | POA: Diagnosis not present

## 2019-10-26 DIAGNOSIS — I10 Essential (primary) hypertension: Secondary | ICD-10-CM | POA: Diagnosis not present

## 2019-10-26 DIAGNOSIS — K922 Gastrointestinal hemorrhage, unspecified: Secondary | ICD-10-CM | POA: Diagnosis not present

## 2019-10-26 DIAGNOSIS — D631 Anemia in chronic kidney disease: Secondary | ICD-10-CM | POA: Diagnosis not present

## 2019-10-26 DIAGNOSIS — I8511 Secondary esophageal varices with bleeding: Secondary | ICD-10-CM | POA: Diagnosis not present

## 2019-10-26 DIAGNOSIS — K767 Hepatorenal syndrome: Secondary | ICD-10-CM | POA: Diagnosis not present

## 2019-10-26 NOTE — Telephone Encounter (Signed)
noted 

## 2019-10-27 DIAGNOSIS — N2581 Secondary hyperparathyroidism of renal origin: Secondary | ICD-10-CM | POA: Diagnosis not present

## 2019-10-27 DIAGNOSIS — E8779 Other fluid overload: Secondary | ICD-10-CM | POA: Diagnosis not present

## 2019-10-27 DIAGNOSIS — Z992 Dependence on renal dialysis: Secondary | ICD-10-CM | POA: Diagnosis not present

## 2019-10-27 DIAGNOSIS — N186 End stage renal disease: Secondary | ICD-10-CM | POA: Diagnosis not present

## 2019-10-30 DIAGNOSIS — Z992 Dependence on renal dialysis: Secondary | ICD-10-CM | POA: Diagnosis not present

## 2019-10-30 DIAGNOSIS — N186 End stage renal disease: Secondary | ICD-10-CM | POA: Diagnosis not present

## 2019-10-31 ENCOUNTER — Ambulatory Visit: Payer: Medicare Other | Admitting: Nurse Practitioner

## 2019-10-31 DIAGNOSIS — E1122 Type 2 diabetes mellitus with diabetic chronic kidney disease: Secondary | ICD-10-CM | POA: Diagnosis not present

## 2019-10-31 DIAGNOSIS — K7290 Hepatic failure, unspecified without coma: Secondary | ICD-10-CM | POA: Diagnosis not present

## 2019-10-31 DIAGNOSIS — E1142 Type 2 diabetes mellitus with diabetic polyneuropathy: Secondary | ICD-10-CM | POA: Diagnosis not present

## 2019-10-31 DIAGNOSIS — K767 Hepatorenal syndrome: Secondary | ICD-10-CM | POA: Diagnosis not present

## 2019-10-31 DIAGNOSIS — N186 End stage renal disease: Secondary | ICD-10-CM | POA: Diagnosis not present

## 2019-10-31 DIAGNOSIS — I8511 Secondary esophageal varices with bleeding: Secondary | ICD-10-CM | POA: Diagnosis not present

## 2019-10-31 DIAGNOSIS — R3912 Poor urinary stream: Secondary | ICD-10-CM | POA: Diagnosis not present

## 2019-10-31 DIAGNOSIS — R338 Other retention of urine: Secondary | ICD-10-CM | POA: Diagnosis not present

## 2019-10-31 DIAGNOSIS — K922 Gastrointestinal hemorrhage, unspecified: Secondary | ICD-10-CM | POA: Diagnosis not present

## 2019-10-31 DIAGNOSIS — D631 Anemia in chronic kidney disease: Secondary | ICD-10-CM | POA: Diagnosis not present

## 2019-10-31 DIAGNOSIS — I10 Essential (primary) hypertension: Secondary | ICD-10-CM | POA: Diagnosis not present

## 2019-11-01 DIAGNOSIS — N186 End stage renal disease: Secondary | ICD-10-CM | POA: Diagnosis not present

## 2019-11-01 DIAGNOSIS — Z992 Dependence on renal dialysis: Secondary | ICD-10-CM | POA: Diagnosis not present

## 2019-11-02 DIAGNOSIS — E1122 Type 2 diabetes mellitus with diabetic chronic kidney disease: Secondary | ICD-10-CM | POA: Diagnosis not present

## 2019-11-02 DIAGNOSIS — N186 End stage renal disease: Secondary | ICD-10-CM | POA: Diagnosis not present

## 2019-11-02 DIAGNOSIS — I8511 Secondary esophageal varices with bleeding: Secondary | ICD-10-CM | POA: Diagnosis not present

## 2019-11-02 DIAGNOSIS — K922 Gastrointestinal hemorrhage, unspecified: Secondary | ICD-10-CM | POA: Diagnosis not present

## 2019-11-02 DIAGNOSIS — E1142 Type 2 diabetes mellitus with diabetic polyneuropathy: Secondary | ICD-10-CM | POA: Diagnosis not present

## 2019-11-02 DIAGNOSIS — R338 Other retention of urine: Secondary | ICD-10-CM | POA: Diagnosis not present

## 2019-11-02 DIAGNOSIS — D631 Anemia in chronic kidney disease: Secondary | ICD-10-CM | POA: Diagnosis not present

## 2019-11-02 DIAGNOSIS — R3912 Poor urinary stream: Secondary | ICD-10-CM | POA: Diagnosis not present

## 2019-11-02 DIAGNOSIS — K767 Hepatorenal syndrome: Secondary | ICD-10-CM | POA: Diagnosis not present

## 2019-11-02 DIAGNOSIS — I10 Essential (primary) hypertension: Secondary | ICD-10-CM | POA: Diagnosis not present

## 2019-11-02 DIAGNOSIS — K7290 Hepatic failure, unspecified without coma: Secondary | ICD-10-CM | POA: Diagnosis not present

## 2019-11-03 ENCOUNTER — Ambulatory Visit: Payer: Medicare Other | Admitting: Gastroenterology

## 2019-11-03 DIAGNOSIS — N186 End stage renal disease: Secondary | ICD-10-CM | POA: Diagnosis not present

## 2019-11-03 DIAGNOSIS — Z992 Dependence on renal dialysis: Secondary | ICD-10-CM | POA: Diagnosis not present

## 2019-11-04 DIAGNOSIS — Z992 Dependence on renal dialysis: Secondary | ICD-10-CM | POA: Diagnosis not present

## 2019-11-04 DIAGNOSIS — N186 End stage renal disease: Secondary | ICD-10-CM | POA: Diagnosis not present

## 2019-11-06 DIAGNOSIS — Z992 Dependence on renal dialysis: Secondary | ICD-10-CM | POA: Diagnosis not present

## 2019-11-06 DIAGNOSIS — N2581 Secondary hyperparathyroidism of renal origin: Secondary | ICD-10-CM | POA: Diagnosis not present

## 2019-11-06 DIAGNOSIS — E878 Other disorders of electrolyte and fluid balance, not elsewhere classified: Secondary | ICD-10-CM | POA: Diagnosis not present

## 2019-11-06 DIAGNOSIS — N186 End stage renal disease: Secondary | ICD-10-CM | POA: Diagnosis not present

## 2019-11-07 DIAGNOSIS — Z794 Long term (current) use of insulin: Secondary | ICD-10-CM | POA: Diagnosis not present

## 2019-11-07 DIAGNOSIS — E7220 Disorder of urea cycle metabolism, unspecified: Secondary | ICD-10-CM | POA: Diagnosis not present

## 2019-11-07 DIAGNOSIS — E1165 Type 2 diabetes mellitus with hyperglycemia: Secondary | ICD-10-CM | POA: Diagnosis not present

## 2019-11-07 DIAGNOSIS — I10 Essential (primary) hypertension: Secondary | ICD-10-CM | POA: Diagnosis not present

## 2019-11-07 DIAGNOSIS — J9611 Chronic respiratory failure with hypoxia: Secondary | ICD-10-CM | POA: Diagnosis not present

## 2019-11-07 DIAGNOSIS — M1990 Unspecified osteoarthritis, unspecified site: Secondary | ICD-10-CM | POA: Diagnosis not present

## 2019-11-07 DIAGNOSIS — Z299 Encounter for prophylactic measures, unspecified: Secondary | ICD-10-CM | POA: Diagnosis not present

## 2019-11-08 DIAGNOSIS — N186 End stage renal disease: Secondary | ICD-10-CM | POA: Diagnosis not present

## 2019-11-08 DIAGNOSIS — Z992 Dependence on renal dialysis: Secondary | ICD-10-CM | POA: Diagnosis not present

## 2019-11-09 ENCOUNTER — Encounter: Payer: Self-pay | Admitting: Physician Assistant

## 2019-11-09 ENCOUNTER — Ambulatory Visit (INDEPENDENT_AMBULATORY_CARE_PROVIDER_SITE_OTHER): Payer: Self-pay | Admitting: Physician Assistant

## 2019-11-09 ENCOUNTER — Other Ambulatory Visit: Payer: Self-pay

## 2019-11-09 VITALS — BP 115/62 | HR 83 | Resp 20 | Ht 67.0 in | Wt 251.3 lb

## 2019-11-09 DIAGNOSIS — Z992 Dependence on renal dialysis: Secondary | ICD-10-CM

## 2019-11-09 DIAGNOSIS — N186 End stage renal disease: Secondary | ICD-10-CM

## 2019-11-09 DIAGNOSIS — R338 Other retention of urine: Secondary | ICD-10-CM | POA: Diagnosis not present

## 2019-11-09 NOTE — Progress Notes (Signed)
POST OPERATIVE OFFICE NOTE    CC:  F/u for surgery  HPI:  This is a 74 y.o. male who is s/p first stage basilic 5/44/9201 followed by second stage 09/28/2019  by Dr. Donnetta Hutching.  Pt returns today for follow up.   He denise pain, loss of motor and loss of sensation.  Allergies  Allergen Reactions  . Chlorhexidine   . Tape Itching and Rash    Current Outpatient Medications  Medication Sig Dispense Refill  . Alcohol Swabs (ALCOHOL PREP) 70 % PADS SMARTSIG:1 Pledget(s) Topical 3 Times Daily    . Ascorbic Acid (VITAMIN C) 1000 MG tablet Take 1,000 mg by mouth 2 (two) times daily.    . cholestyramine (QUESTRAN) 4 GM/DOSE powder Take 4 g by mouth in the morning.     . diazepam (VALIUM) 5 MG tablet Take 5 mg by mouth daily as needed for anxiety.     . furosemide (LASIX) 80 MG tablet Take 80 mg by mouth in the morning.     . gabapentin (NEURONTIN) 100 MG capsule TAKE 1 CAPSULE BY MOUTH AT BEDTIME 90 capsule 0  . HUMALOG KWIKPEN 100 UNIT/ML KwikPen     . HYDROcodone-acetaminophen (NORCO) 10-325 MG tablet Take 1 tablet by mouth every 6 (six) hours as needed for moderate pain. 20 tablet 0  . hydrOXYzine (VISTARIL) 25 MG capsule Take 25 mg by mouth 3 (three) times daily as needed for itching.     . insulin degludec (TRESIBA FLEXTOUCH) 200 UNIT/ML FlexTouch Pen Inject 120 Units into the skin at bedtime. When blood sugars are up    . iron polysaccharides (NIFEREX) 150 MG capsule Take 150 mg by mouth at bedtime.     [name] JANUVIA 100 MG tablet Take 100 mg by mouth daily.    [name] JANUVIA 50 MG tablet Take 50 mg by mouth daily.    . metoprolol succinate (TOPROL-XL) 25 MG 24 hr tablet Take 25 mg by mouth 2 (two) times daily.    . metoprolol tartrate (LOPRESSOR) 25 MG tablet Take 25 mg by mouth 2 (two) times daily.    . milk thistle 175 MG tablet Take 175 mg by mouth 3 (three) times daily.     . mirtazapine (REMERON) 15 MG tablet Take 15 mg by mouth at bedtime.    . Multiple Vitamin (MULTIVITAMIN WITH MINERALS)  TABS tablet Take 1 tablet by mouth in the morning.     [name] NOVOFINE 32G X 6 MM MISC USE DAILY AS NEEDED (MAX 12 SHOTS PER DAY)    . omega-3 acid ethyl esters (LOVAZA) 1 g capsule Take 2 g by mouth 2 (two) times daily.     [name] omeprazole (PRILOSEC) 40 MG capsule Take 40 mg by mouth in the morning.     . pravastatin (PRAVACHOL) 20 MG tablet Take 20 mg by mouth in the morning.     . Probiotic Product (PROBIOTIC DAILY PO) Take 1 tablet by mouth in the morning.     . rifaximin (XIFAXAN) 550 MG TABS tablet Take 1 tablet (550 mg total) by mouth 2 (two) times daily. 60 tablet 11  . spironolactone (ALDACTONE) 50 MG tablet Take 50 mg by mouth in the morning.     . tamsulosin (FLOMAX) 0.4 MG CAPS capsule Take 0.4 mg by mouth every evening.    . thiamine 100 MG tablet Take 100 mg by mouth in the morning. Vitamin B-1    . tiZANidine (ZANAFLEX) 2 MG tablet Take 2 mg by mouth every  6 (six) hours as needed for muscle spasms.     [name] triamcinolone cream (KENALOG) 0.1 % Apply 1 application topically 2 (two) times daily as needed (dry skin Itching).   3  . venlafaxine (EFFEXOR) 75 MG tablet Take 75 mg by mouth 3 (three) times daily with meals.      No current facility-administered medications for this visit.     ROS:  See HPI  Physical Exam: Vitals:   11/09/19 0911  BP: 115/62  Pulse: 83  Resp: 20  SpO2: 97%      Incision:  Well healed incisions Extremities:  Grip 5/5, palpable radial pulse with intact sensation and palpable thrill.   Lungs:  Non labored breathing with O2 support   Assessment/Plan:  This is a 74 y.o. male who is s/p:second stage basilic fistula transposition.  Well developed basilic fistula.  Right TDC.  May use fistula 11/17/2019.  F/U PRN  Roxy Horseman PA-C [facility] [phone]  Clinic MD:  Oneida Alar

## 2019-11-10 DIAGNOSIS — Z992 Dependence on renal dialysis: Secondary | ICD-10-CM | POA: Diagnosis not present

## 2019-11-10 DIAGNOSIS — N186 End stage renal disease: Secondary | ICD-10-CM | POA: Diagnosis not present

## 2019-11-13 DIAGNOSIS — N186 End stage renal disease: Secondary | ICD-10-CM | POA: Diagnosis not present

## 2019-11-13 DIAGNOSIS — N2581 Secondary hyperparathyroidism of renal origin: Secondary | ICD-10-CM | POA: Diagnosis not present

## 2019-11-13 DIAGNOSIS — Z992 Dependence on renal dialysis: Secondary | ICD-10-CM | POA: Diagnosis not present

## 2019-11-15 DIAGNOSIS — N186 End stage renal disease: Secondary | ICD-10-CM | POA: Diagnosis not present

## 2019-11-15 DIAGNOSIS — Z992 Dependence on renal dialysis: Secondary | ICD-10-CM | POA: Diagnosis not present

## 2019-11-15 DIAGNOSIS — E878 Other disorders of electrolyte and fluid balance, not elsewhere classified: Secondary | ICD-10-CM | POA: Diagnosis not present

## 2019-11-15 NOTE — Progress Notes (Signed)
Referring Provider: Monico Blitz, MD Primary Care Physician:  Monico Blitz, MD Primary GI Physician: Dr. Gala Romney  Chief Complaint  Patient presents with  . Cirrhosis    Had hosp visit in Apr, developed hard knot in bottom of stomach since then, size of a hand per pt    HPI:   Jeffrey [name] is a 74 y.o. male presenting today with a history of alcoholic cirrhosis, MELD Na 23 on dialysis since earlier this year (possible due to hepatorenal syndrome)/Child Pugh B, esophageal varices with banding in the past, hepatic encephalopathy on Xifaxan BID, GI bleed, transfusion dependent anemia, and history of C. diff colitis. He has immunity to Hep A but not Hep B despite vaccination 2006/2007. Right upper quadrant ultrasound April 2021 with cirrhosis but no hepatic masses.   Patient had been on propranolol for variceal bleed prophylaxis; however, during hospitalization in November 2020, patient had paracentesis with PMNs greater than 3000 although culture without growth. Nonselective BB was discontinued at that time. Had been recommended to be on Bactrim DS for SBP prophylaxis.   Prior hospitalizations this year in January/February 2021 and April 2021.  Hospitalized in January/February 2021 for GI bleeding at an outside facility and received total of 7 units of packed red blood cells, EGD completed while inpatient in January showed small esophageal varices without high risk stigmata, single large AVM on lesser curvature in gastric body status post APC ablation, gastric antral and duodenal bulb polyposis.  Colonoscopy with portal colopathy and diffuse edema, changes of C. difficile colitis on colonoscopy.  Felt to have developing hepatorenal syndrome and permanent cath placed, started dialysis while inpatient.  Has required multiple paracenteses while inpatient. C. difficile colitis December 2020, was refractory to vanc.  He completed 20-day course of Dificid.  Repeat C. difficile positive in February, started  on oral vanc with taper.  Bactrim for SBP prophylaxis was discontinued. Hospitalized in April after being found to have a hemoglobin of 5.4 at the time of dialysis.  Heme positive stool.  Reported previous melena for a few weeks with the last black stool prior to admission. He received 3 units PRBCs. No evidence of diarrhea during April admission. Eval for anemia as per below.    EGD 09/19/19: Grade I and grade II esophageal varices with no stigmata of recent bleeding. - MILD Portal hypertensive gastropathy. - MODERATE Gastritis. Biopsied. (chronic inactive gastritis, no H.pylori) - A single gastric polyp. Resected and retrieved. (hyperplastic polyps) - NO OBVOUS SOURCE FOR MELENA/TRANSFUSION DEPENDENT ANEMIA IDENTIFIED. MAY BE DUE TO FRIABLE GASTRIC AND DUODENAL MUCOSA IN SETTING OF PORTAL HYPERTENSION  Small bowel Givens capsule on September 22, 2019, poor study with a lot of debris obstructing much of the view of the first approximate 3 hours out of 6 hours.  No obvious source of bleeding.  Sequelae of bleeding and old blood seen in the form of "whisps" of blood, blood flecks mostly toward the end of the study.   He was last seen via virtual visit 10/10/2019 for hospital follow-up.  Reported last labs at the time of dialysis with hemoglobin 8.4.  Denied melena or BRBPR.  Having 2-3 BMs daily that were often loose.  Some days without BMs.  Still taking vancomycin about once per day but often forgets.  Was not sure when to stop.  He had not been consistent with it and should have been out in March.  Also still taking Questran once daily.  No confusion.  Taking Xifaxan twice daily.  Chronically his legs were swollen all the way up to his waist.  Still had catheter.  Had fistula operation the week prior in his left arm.  He was on Lasix 80 mg and Aldactone 50 mg.  We had not managed diuretics since patient was placed on dialysis earlier this year.  Regarding cirrhosis, he was advised to continue his current  medications and address peripheral edema/diuretic adjustments with nephrologist.  Regarding anemia, plan to continue to monitor H/H, consider infusion/blood transfusion as needed, may require hematology referral for ongoing management.  Advised to continue omeprazole.  He was placed on recall for possible colonoscopy in February 2022.  Lab report scanned in from 4/29 with hemoglobin 9.9.  This is improved from hemoglobin 8.4 at discharge on 4/23.  Telephone call 10/23/2019 with patient's wife reporting patient has been having diarrhea since 5/22 with some abdominal and left flank pain.  Recommended C. difficile [facility].  Orders were placed but apparently patient received a call from his primary care's office who also ordered C. difficile testing.  Stated they would have results faxed over once they receive them.  We have not received any results.  Today: Has a hard place in the lower abdomen. No redness. No pain. Not any larger than it was when he told [name] about it over the phone in May. Otherwise, he has been doing well. Still has a urinary catheter. Chronically with swelling up to his waist. States he is always getting fussed at about this. Swelling has not worsened. Doesn't feel his abdomen is distended. Dialysis increased to 4 days a week about 1 month ago. Hasn't noticed much of a difference. Makes about 500 cc urine a day. No medication adjustments have been made. Feels weight has been about the same at dialysis. Tryring to follow a low sodium diet. States sometimes it doesn't work. No confusion. Taking Xifaxan BID. No blood in the stool or black stools. None since April. Was told hemoglobin was a little low yesterday. Planning to recheck tomorrow. No yellowing of eyes or skin. Bruises easily.   Had discussed being seen at [facility] for possible liver transplant with Dr. Gala Romney about a year ago.  Patient states he is not interested in this.   No GERD symptoms, dysphagia, nausea, or vomiting. No diarrhea. BMs  daily typically but sometimes may skip a day. Has decreased Questran. Hasn't taken it in 1 week. C. diff testing was negative.   Has received 1 dose of Hep B vaccine at dialysis. Getting epogen at dialysis.   Past Medical History:  Diagnosis Date  . A-fib [facility])    when initially stating dialysis january 2021 at [facility] per spouse; never heard anything else about it  . Alcoholic cirrhosis ([place])    patient reports completing hep a and b vaccines in 2006  . Anemia    has had 3 units of prbcs 2013  . Anxiety   . Asymptomatic gallstones    Ultrasound in 2006  . Cholelithiasis   . Chronic kidney disease    Dialysis M/W/F/Sa  . Depression   . Diabetes ( [place])   . Duodenal ulcer with hemorrhage    per patient in 2006 or 2007, records have been requested  . Dyspnea    low oxygen level -  has O2 2 L all day  . 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 GERD (gastroesophageal reflux disease)   . 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 HOH (hard  of hearing)   . Hypertension   . Splenomegaly    Ultrasound in 2006  . Tubular adenoma of colon     Past Surgical History:  Procedure Laterality Date  . AGILE CAPSULE N/A 09/20/2019   Procedure: AGILE CAPSULE;  Surgeon: Daneil Dolin, MD;  [facility];  Service: Endoscopy;  Laterality: N/A;  . APPENDECTOMY    . AV FISTULA PLACEMENT Left 08/17/2019   Procedure: ARTERIOVENOUS (AV) FISTULA CREATION VERSES ARTERIOVENOUS GRAFT;  Surgeon: [name], MD;  Location: [place];  Service: Vascular;  Laterality: Left;  . BASCILIC VEIN TRANSPOSITION Left 09/28/2019   Procedure: LEFT SECOND STAGE BASCILIC VEIN TRANSPOSITION;  Surgeon: [name], MD;  Location: [place];  Service: Vascular;  Laterality: Left;  . BIOPSY  02/03/2018   Procedure: BIOPSY;  Surgeon: Daneil Dolin, MD;  [facility];  Service: Endoscopy;;  bx of gastric polyps  . BIOPSY  09/19/2019   Procedure: BIOPSY;  Surgeon: Danie Binder, MD;  [facility];  Service: Endoscopy;;  . CATARACT EXTRACTION Right   . COLONOSCOPY  03/10/2005   Rectal polyp as described above, removed with snare. Left sided  diverticula. The remainder of the colonic mucosa appeared normal. Inflammed polyp on path.  . COLONOSCOPY  04/1999   Dr. Thea Silversmith polyps removed,  Path showed hyperplastic  . COLONOSCOPY  10/2015   Dr. Britta Mccreedy: diverticulosis, single sessile tubular adenoma 3-59mm in size removed from descending colon.   . COLONOSCOPY WITH ESOPHAGOGASTRODUODENOSCOPY (EGD)  03/31/2012   LKG:[id number] AVMs. Colonic diverticulosis. tubular adenoma colon  . COLONOSCOPY WITH ESOPHAGOGASTRODUODENOSCOPY (EGD)  06/2019   FORSYTH: small esophageal varices without high risk stigmata, single large AVM on lesser curvature in gastric body s/p APC ablation, gastric antral and duodenal bulb polyposis. No significant source to explain transfusion dependent anemia. Colonoscopy with portal colopathy and diffuse edema, changes of Cdiff colitis on colonoscopy  . ESOPHAGEAL BANDING N/A 02/03/2018   Procedure: ESOPHAGEAL BANDING;  Surgeon: Daneil Dolin, MD;  [facility];  Service: Endoscopy;  Laterality: N/A;  . ESOPHAGOGASTRODUODENOSCOPY  01/15/2005   Three columns grade 1 to 2 esophageal varices, otherwise normal esophageal mucosa.  Esophagus was not manipulated otherwise./Nodularity of the antrum with overlying erosions, nonspecific finding. Path showed rare H.pylori  . ESOPHAGOGASTRODUODENOSCOPY  09/2008   Dr. Gaylene Brooks columns of grade 2-3 esoph varices, only one column was prominent. Portal gastropathy, multiple gastrc polyps at antrum, two were 2cm with black eschar, bulbar polyps, bulbar erosions  . ESOPHAGOGASTRODUODENOSCOPY  11/2004   Dr. Brantley Stage 3 esoph varices  . ESOPHAGOGASTRODUODENOSCOPY  03/31/2012   RMR: 4 columns(3-GR 2, 1-GR1) non-bleeding esophageal varices, portal gastropathy, small HH, early GAVE, multiple gastric polyps   .  ESOPHAGOGASTRODUODENOSCOPY (EGD) WITH PROPOFOL N/A 02/03/2018   Dr. Gala Romney: Esophageal varices, 3 columns grade 1-2.  Portal hypertensive gastropathy.  Multiple gastric polyps, biopsy consistent with hyperplastic.  [name] ESOPHAGOGASTRODUODENOSCOPY (EGD) WITH PROPOFOL N/A 09/19/2019   Fields: grade I and II esophageal varcies, mild portal hypertensive gastropathy, moderate gastritis but no H. pylori, single hyperplastic gastric polyp removed, obvious source for melena/transfusion dependent anemia not identified, may be due to friable gastric and duodenal mucosa in the setting of portal hypertension  . GASTRIC VARICES BANDING  03/31/2012   Procedure: GASTRIC VARICES BANDING;  Surgeon: Daneil Dolin, MD;  [facility];  Service: Endoscopy;  Laterality: N/A;  . GIVENS CAPSULE STUDY N/A 09/21/2019   Poor study with a lot  of debris obstructing much of the view of the first approximate 3 hours out of 6 hours.  No obvious source of bleeding identified.  Sequela of bleeding and old blood seen in the form of"whisps" of blood, blood flecks mostly toward the end of the study.  [name] POLYPECTOMY  09/19/2019   Procedure: POLYPECTOMY;  Surgeon: Danie Binder, MD;  [facility];  Service: Endoscopy;;  . UMBILICAL HERNIA REPAIR  2017   exploratory laparotomy, abdominal washout    Current Outpatient Medications  Medication Sig Dispense Refill  . Alcohol Swabs (ALCOHOL PREP) 70 % PADS SMARTSIG:1 Pledget(s) Topical 3 Times Daily    . Ascorbic Acid (VITAMIN C) 1000 MG tablet Take 1,000 mg by mouth 2 (two) times daily.    . cholestyramine (QUESTRAN) 4 GM/DOSE powder Take 4 g by mouth in the morning.     . furosemide (LASIX) 80 MG tablet Take 80 mg by mouth in the morning.     . gabapentin (NEURONTIN) 100 MG capsule TAKE 1 CAPSULE BY MOUTH AT BEDTIME 90 capsule 0  . HUMALOG KWIKPEN 100 UNIT/ML KwikPen as needed. Sliding scale    . HYDROcodone-acetaminophen (NORCO) 10-325 MG tablet Take 1 tablet by mouth  every 6 (six) hours as needed for moderate pain. (Patient taking differently: Take 1 tablet by mouth as needed for moderate pain. ) 20 tablet 0  . hydrOXYzine (VISTARIL) 25 MG capsule Take 25 mg by mouth in the morning and at bedtime.     . insulin degludec (TRESIBA FLEXTOUCH) 200 UNIT/ML FlexTouch Pen Inject 120 Units into the skin at bedtime. When blood sugars are up    . iron polysaccharides (NIFEREX) 150 MG capsule Take 150 mg by mouth at bedtime.     . metoprolol succinate (TOPROL-XL) 25 MG 24 hr tablet Take 25 mg by mouth 2 (two) times daily.    . milk thistle 175 MG tablet Take 175 mg by mouth 3 (three) times daily.     . mirtazapine (REMERON) 15 MG tablet Take 15 mg by mouth at bedtime.    . Multiple Vitamin (MULTIVITAMIN WITH MINERALS) TABS tablet Take 1 tablet by mouth in the morning.     [name] NOVOFINE 32G X 6 MM MISC USE DAILY AS NEEDED (MAX 12 SHOTS PER DAY)    . omega-3 acid ethyl esters (LOVAZA) 1 g capsule Take 2 g by mouth 2 (two) times daily.     [name] omeprazole (PRILOSEC) 40 MG capsule Take 40 mg by mouth in the morning.     . pravastatin (PRAVACHOL) 20 MG tablet Take 20 mg by mouth in the morning.     . Probiotic Product (PROBIOTIC DAILY PO) Take 1 tablet by mouth in the morning.     . rifaximin (XIFAXAN) 550 MG TABS tablet Take 1 tablet (550 mg total) by mouth 2 (two) times daily. 60 tablet 11  . spironolactone (ALDACTONE) 50 MG tablet Take 50 mg by mouth in the morning.     . tamsulosin (FLOMAX) 0.4 MG CAPS capsule Take 0.4 mg by mouth every evening.    . thiamine 100 MG tablet Take 100 mg by mouth in the morning. Vitamin B-1    . tiZANidine (ZANAFLEX) 2 MG tablet Take 2 mg by mouth as needed for muscle spasms.     [name] triamcinolone cream (KENALOG) 0.1 % Apply 1 application topically 2 (two) times daily as needed (dry skin Itching).   3  . venlafaxine (EFFEXOR) 75 MG tablet Take 75 mg  by mouth 3 (three) times daily with meals.      No current facility-administered medications for  this visit.    Allergies as of 11/16/2019 - Review Complete 11/16/2019  Allergen Reaction Noted  . Chlorhexidine  09/19/2019  . Tape Itching and Rash 09/18/2019    Family History  Problem Relation Age of Onset  . Colon cancer Father 34       deceased age 42  . Breast cancer Sister        deceased  . Breast cancer Sister   . Stroke Mother   . Lung cancer Neg Hx   . Ovarian cancer Neg Hx     Social History   Socioeconomic History  . Marital status: Married    Spouse name: Not on file  . Number of children: 2  . Years of education: Not on file  . Highest education level: Not on file  Occupational History  . Occupation: Disabled    Employer: SELF EMPLOYED  Tobacco Use  . Smoking status: Former Smoker    Packs/day: 0.50    Years: 25.00    Pack years: 12.50    Types: Cigarettes    Quit date: 01/28/1986    Years since quitting: 33.8  . Smokeless tobacco: Never Used  Vaping Use  . Vaping Use: Never used  Substance and Sexual Activity  . Alcohol use: No    Comment: quit in 05/2013  . Drug use: No  . Sexual activity: Not Currently  Other Topics Concern  . Not on file  Social History Narrative   Two step children from second marriage (divorced) who live with him along with some grandchildren.    Social Determinants of Health   Financial Resource Strain:   . Difficulty of Paying Living Expenses:   Food Insecurity:   . Worried About Charity fundraiser in the Last Year:   . Arboriculturist in the Last Year:   Transportation Needs:   . Film/video editor (Medical):   [name] Lack of Transportation (Non-Medical):   Physical Activity:   . Days of Exercise per Week:   . Minutes of Exercise per Session:   Stress:   . Feeling of Stress :   Social Connections:   . Frequency of Communication with Friends and Family:   . Frequency of Social Gatherings with Friends and Family:   . Attends Religious Services:   . Active Member of Clubs or Organizations:   . Attends English as a second language teacher Meetings:   [name] Marital Status:     Review of Systems: Gen: Denies fever, chills, lightheadedness, dizziness, presyncope, syncope CV: Denies chest pain or palpitations Resp: Has been on oxygen since April. On 2L [place]. No SOB or cough.  GI: See HPI Heme: See HPI  Physical Exam: BP (!) 141/66   Pulse 86   Temp (!) 97.1 F (36.2 C) (Temporal)   Ht 5\' 7"  (1.702 m)   Wt 258 lb 6.4 oz (117.2 kg)   BMI 40.47 kg/m  General:   Alert and oriented. No distress noted. Pleasant and cooperative.  Walking with a walker. Head:  Normocephalic and atraumatic. Eyes:  Conjuctiva clear without scleral icterus. Heart:  S1, S2 present without murmurs appreciated. Lungs:  Clear to auscultation bilaterally. No wheezes, rales, or rhonchi. No distress.  Nasal cannula in place. Abdomen: Obese abdomen.  +BS, soft, non-tender.  Skin in the very lower part of his abdomen/suprapubic area is indurated with evidence of dependent pitting edema.  No erythema or tenderness. No rebound or guarding. No masses noted. Msk:  Symmetrical without gross deformities. Normal posture. Extremities:  With pitting edema up through his thighs.  No erythema or tenderness. Neurologic:  Alert and  oriented x4 Psych:  Normal mood and affect.

## 2019-11-16 ENCOUNTER — Encounter: Payer: Self-pay | Admitting: Gastroenterology

## 2019-11-16 ENCOUNTER — Other Ambulatory Visit: Payer: Self-pay

## 2019-11-16 ENCOUNTER — Ambulatory Visit: Payer: Medicare Other | Admitting: Gastroenterology

## 2019-11-16 VITALS — BP 141/66 | HR 86 | Temp 97.1°F | Ht 67.0 in | Wt 258.4 lb

## 2019-11-16 DIAGNOSIS — D649 Anemia, unspecified: Secondary | ICD-10-CM | POA: Diagnosis not present

## 2019-11-16 DIAGNOSIS — K7030 Alcoholic cirrhosis of liver without ascites: Secondary | ICD-10-CM | POA: Diagnosis not present

## 2019-11-16 NOTE — Patient Instructions (Addendum)
Please have labs completed at [facility] taking your current doses of Lasix and spironolactone and keeping all of your dialysis appointments at this will be very important for management of your fluid accumulation.  Be sure you are following a low-sodium diet.  No more than 2000 mg daily.  Continue taking Xifaxan 550 mg twice daily.  Continue taking omeprazole 40 mg daily.  Be sure to avoid all NSAIDs including ibuprofen, Aleve, Advil, and Goody powders.  We will plan to see you back in 3 months.  Call with questions or concerns prior.  [name], PA-C [facility]   Low Sodium Diet (Less than 2000 mg daily)   Foods to choose  Foods to avoid                Breads:   Whole-grain breads, English muffins, bagels  Biscuits, prepared mixes (pancake, muffin, cornbread)  Cereals:   Cooked hot cereals (not instant), such as oatmeal, cream of wheat, rice, or farina; puffed wheat; puffed rice; shredded wheat  Instant hot cereals, many boxed cold cereals  Crackers and snack foods:   All unsalted crackers and snack foods, unsalted peanut butter, unsalted nuts or seeds  Salted crackers and snack items (chips, pretzels, popcorn), regular peanut butter, prepared dips/spreads, salted nuts or seeds  Pasta, rice, and potatoes:   Any type of pasta; potatoes; white or brown rice  Macaroni and cheese mix; rice, noodle, or spaghetti mixes; canned spaghetti; frozen lasagna; instant potatoes; seasoned potato mixes  Dried beans and peas:   Any dried beans or peas without seasoning  Beans or peas prepared with ham, bacon, salt pork, or bacon grease; all canned beans  Meats and alternatives:   Fresh or frozen beef, poultry, and fish; low-sodium canned tuna and salmon; eggs  Salted, smoked, canned, spiced, and cured meat, poultry, or fish; bacon; ham; sausage; lunch meats; hot dogs; breaded frozen meat, fish, or poultry; frozen dinners; pizza  Fruits and vegetables:   Any fresh,  frozen, or canned fruit, any fresh or frozen vegetables without sauce, canned vegetables without salt, low-salt tomato sauce/paste  Regular canned vegetables and vegetable juices, regular tomato sauce and tomato paste, olives, pickles, relishes, sauerkraut, frozen vegetables in butter or sauces, crystallized and glazed fruit, maraschino cherries, fruit dried with sodium sulfite  Dairy products:   Milk, cream, sour cream, non-dairy creamer, yogurt, low-sodium cottage cheese, low-sodium cheese  Buttermilk, [place] processed chocolate milk, processed cheese slices and spreads, processed cheese, cottage cheese, aged or natural cheese  Fats and oils:   Plant oils (olive, canola, corn, peanut), unsalted butter or margarine  Prepared salad dressings, bacon, salt pork, fat back, salted butter or margarine  Soups:   Salt-free soups and low-sodium bouillon cubes, unsalted broth, homemade soup without added salt  Regular canned or prepared soups, stews, broths, or bouillon; packaged and frozen soups  Desserts:   Gelatin, sherbet, pudding, ice cream, salt-free baked goods, sugar, honey, jam, jelly, marmalade, syrup  Packaged baked goods  Beverages:   Coffee, tea, soft drinks, fruit-flavored drinks, low-salt tomato juice, any fruit juice  Softened water; carbonated beverages with sodium or salt added; regular tomato juice (V-8); ask about alcoholic beverages  Condiments:   Fresh and dried herbs; lemon juice; low-salt mustard, vinegar, Tabasco sauce; low- or no-salt ketchup; seasoning blends that do not contain salt  Table salt, lite salt, bouillon cubes, meat extract, taco seasoning, Worcestershire sauce, tartar sauce, ketchup, chili sauce, cooking sherry and wine, onion salt, mustard, garlic salt,  soy sauce, tamari, meat flavoring or tenderizer, steak and barbecue sauce, seasoned salt, monosodium glutamate (MSG), [place] processed cocoa

## 2019-11-16 NOTE — Assessment & Plan Note (Addendum)
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 EGD up-to-date in April 2021 with grade 1/2 esophageal varices with no stigmata of recent bleed, mild portal hypertensive gastropathy, mild gastritis with chronic inactive gastritis, no H. pylori.  Patient had been on propranolol for variceal bleed prophylaxis; however, during hospitalization in November 2020, patient had paracentesis with PMNs greater than 3000 although culture without growth.  Nonselective beta-blocker was discontinued at that time due to increased risk of SBP.  He had been placed on Bactrim DS for SBP prophylaxis but this was later discontinued during hospitalization in January/February due to refractory C. Difficile.  Right upper quadrant ultrasound April 2021 with no focal liver lesions.  Patient has immunity to hepatitis A but not hepatitis B despite vaccination in 2006/2007.  He has received 1 dose of hep B vaccination at dialysis recently.  Not sure if he is receiving a series or if it was a one-time dose.  Overall, patient is doing fairly well.  No return of hepatic encephalopathy.  No BRBPR or melena.  Is chronically fluid overloaded with pitting edema up to his hips.  Also with some dependent/pitting edema in his lower abdomen but no significant ascites. No abdominal pain.  Due to being on dialysis, his diuretics/volume status has been managed by nephrology.  Dialysis was increased to 4 days a week about 1 month ago.  Patient reports weights have been stable and he is following a low-sodium diet.  We will update CBC, CMP, and INR today as we have not updated labs since hospitalization in April. Continue Xifaxan 550 mg twice daily. Continue Lasix 80 mg daily and spironolactone 50 mg daily.  Further management of diuretics/volume  overload per nephrology. Follow a strict low-sodium diet.  No more than 2000 mg daily.  Handout provided. He is due for ultrasound in October 2021.  We will plan to follow-up in 3 months.

## 2019-11-16 NOTE — Assessment & Plan Note (Addendum)
Patient has had transfusion dependent anemia.  Significant GI evaluation in January/February 2021 as well as in April 2021 with 2 EGDs, colonoscopy, and Givens capsule as per HPI.  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  Otherwise, no significant findings to explain transfusion dependent anemia.  It has been thought that this could be due to friable gastric and duodenal mucosa in the setting of portal hypertension.  He denies any BRBPR or melena since admission in April.  He continues on omeprazole 40 mg daily.  Most recent hemoglobin 9.9 on 4/29 which is improved from 8.4 at the time of discharge on 4/23.  Patient's wife reports they were told his hemoglobin was slightly low at dialysis yesterday but did not know the value.  Also reports he has received Epogen at dialysis in the past.  I am updating cirrhosis labs and therefore we will go ahead and obtain a CBC as well.  He was advised to continue omeprazole 40 mg daily, avoid all NSAIDs, and continue to monitor for any obvious bright red blood per rectum or melena.  Consider referral to hematology if hemoglobin continues to decline.

## 2019-11-17 ENCOUNTER — Telehealth: Payer: Self-pay | Admitting: Emergency Medicine

## 2019-11-17 ENCOUNTER — Other Ambulatory Visit: Payer: Self-pay

## 2019-11-17 DIAGNOSIS — D649 Anemia, unspecified: Secondary | ICD-10-CM

## 2019-11-17 DIAGNOSIS — Z992 Dependence on renal dialysis: Secondary | ICD-10-CM | POA: Diagnosis not present

## 2019-11-17 DIAGNOSIS — N186 End stage renal disease: Secondary | ICD-10-CM | POA: Diagnosis not present

## 2019-11-17 LAB — CBC WITH DIFFERENTIAL/PLATELET
Absolute Monocytes: 351 cells/uL (ref 200–950)
Basophils Absolute: 41 cells/uL (ref 0–200)
Basophils Relative: 0.9 %
Eosinophils Absolute: 450 cells/uL (ref 15–500)
Eosinophils Relative: 10 %
HCT: 22.6 % — ABNORMAL LOW (ref 38.5–50.0)
Hemoglobin: 6.9 g/dL — ABNORMAL LOW (ref 13.2–17.1)
Lymphs Abs: 302 cells/uL — ABNORMAL LOW (ref 850–3900)
MCH: 28.8 pg (ref 27.0–33.0)
MCHC: 30.5 g/dL — ABNORMAL LOW (ref 32.0–36.0)
MCV: 94.2 fL (ref 80.0–100.0)
MPV: 10.2 fL (ref 7.5–12.5)
Monocytes Relative: 7.8 %
Neutro Abs: 3357 cells/uL (ref 1500–7800)
Neutrophils Relative %: 74.6 %
Platelets: 145 10*3/uL (ref 140–400)
RBC: 2.4 10*6/uL — ABNORMAL LOW (ref 4.20–5.80)
RDW: 15.4 % — ABNORMAL HIGH (ref 11.0–15.0)
Total Lymphocyte: 6.7 %
WBC: 4.5 10*3/uL (ref 3.8–10.8)

## 2019-11-17 LAB — COMPLETE METABOLIC PANEL WITH GFR
AG Ratio: 1.1 (calc) (ref 1.0–2.5)
ALT: 19 U/L (ref 9–46)
AST: 20 U/L (ref 10–35)
Albumin: 3.2 g/dL — ABNORMAL LOW (ref 3.6–5.1)
Alkaline phosphatase (APISO): 185 U/L — ABNORMAL HIGH (ref 35–144)
BUN/Creatinine Ratio: 6 (calc) (ref 6–22)
BUN: 17 mg/dL (ref 7–25)
CO2: 28 mmol/L (ref 20–32)
Calcium: 8.3 mg/dL — ABNORMAL LOW (ref 8.6–10.3)
Chloride: 97 mmol/L — ABNORMAL LOW (ref 98–110)
Creat: 2.65 mg/dL — ABNORMAL HIGH (ref 0.70–1.18)
GFR, Est African American: 27 mL/min/{1.73_m2} — ABNORMAL LOW (ref >=60)
GFR, Est Non African American: 23 mL/min/{1.73_m2} — ABNORMAL LOW (ref >=60)
Globulin: 3 g/dL (calc) (ref 1.9–3.7)
Glucose, Bld: 188 mg/dL — ABNORMAL HIGH (ref 65–139)
Potassium: 3.7 mmol/L (ref 3.5–5.3)
Sodium: 133 mmol/L — ABNORMAL LOW (ref 135–146)
Total Bilirubin: 0.5 mg/dL (ref 0.2–1.2)
Total Protein: 6.2 g/dL (ref 6.1–8.1)

## 2019-11-17 LAB — PROTIME-INR
INR: 1
Prothrombin Time: 11.2 s (ref 9.0–11.5)

## 2019-11-17 NOTE — Telephone Encounter (Signed)
Called pt verified his name and dob spoke to mrs Howry and pt Notified him that he has an appt set up for 6/21 @ 830 with becky In short stay at [facility] to have his blood drawn for a type and screen and on 6/22 at 11:30am on 6/22 to have his blood transfusion. Provider notified as well. Pt and his wife thanked me for the call and stated they understood and is they have any questions they will call

## 2019-11-17 NOTE — Progress Notes (Unsigned)
m °

## 2019-11-17 NOTE — Progress Notes (Signed)
Cc'ed to pcp °

## 2019-11-20 ENCOUNTER — Other Ambulatory Visit: Payer: Self-pay

## 2019-11-20 ENCOUNTER — Encounter (HOSPITAL_COMMUNITY)
Admission: RE | Admit: 2019-11-20 | Discharge: 2019-11-20 | Disposition: A | Discharge status: Home / Self Care | Payer: Medicare Other | Source: Ambulatory Visit | Attending: Internal Medicine | Admitting: Internal Medicine

## 2019-11-20 ENCOUNTER — Telehealth: Payer: Self-pay | Admitting: Emergency Medicine

## 2019-11-20 ENCOUNTER — Telehealth: Payer: Self-pay | Admitting: Internal Medicine

## 2019-11-20 DIAGNOSIS — E878 Other disorders of electrolyte and fluid balance, not elsewhere classified: Secondary | ICD-10-CM | POA: Diagnosis not present

## 2019-11-20 DIAGNOSIS — D649 Anemia, unspecified: Secondary | ICD-10-CM | POA: Diagnosis not present

## 2019-11-20 DIAGNOSIS — N186 End stage renal disease: Secondary | ICD-10-CM | POA: Diagnosis not present

## 2019-11-20 DIAGNOSIS — Z992 Dependence on renal dialysis: Secondary | ICD-10-CM | POA: Diagnosis not present

## 2019-11-20 LAB — HEMOGLOBIN AND HEMATOCRIT, BLOOD
HCT: 23.3 % — ABNORMAL LOW (ref 39.0–52.0)
Hemoglobin: 6.9 g/dL — CL (ref 13.0–17.0)

## 2019-11-20 LAB — PREPARE RBC (CROSSMATCH)

## 2019-11-20 NOTE — Telephone Encounter (Signed)
Faxed order to short stay [phone], spoke to becky

## 2019-11-20 NOTE — Telephone Encounter (Signed)
Orders were faxed by [facility] this morning. Short stay received order.

## 2019-11-20 NOTE — Progress Notes (Signed)
Lab called with critical result of Hgb 6.9. same result as 11/16/19. Will transfuse 1 unit of PRBC in AM as ordered.

## 2019-11-20 NOTE — Telephone Encounter (Signed)
Becky from [facility] called to say patient has appt at 0830 and they needed his orders. Fax # [phone]

## 2019-11-20 NOTE — Progress Notes (Signed)
Here for lab draw. Blood consent signed. Blood drawn and sent to lab for type and cross and H/H. Reminded of appt for 1130 tomorrow for blood transfusion. Voiced understanding.

## 2019-11-21 ENCOUNTER — Encounter (HOSPITAL_COMMUNITY)
Admission: RE | Admit: 2019-11-21 | Discharge: 2019-11-21 | Disposition: A | Discharge status: Home / Self Care | Payer: Medicare Other | Source: Ambulatory Visit | Attending: Internal Medicine | Admitting: Internal Medicine

## 2019-11-21 ENCOUNTER — Other Ambulatory Visit: Payer: Self-pay

## 2019-11-21 DIAGNOSIS — D649 Anemia, unspecified: Secondary | ICD-10-CM | POA: Insufficient documentation

## 2019-11-21 LAB — HEMOGLOBIN AND HEMATOCRIT, BLOOD
HCT: 24.6 % — ABNORMAL LOW (ref 39.0–52.0)
Hemoglobin: 7.4 g/dL — ABNORMAL LOW (ref 13.0–17.0)

## 2019-11-21 MED ORDER — ACETAMINOPHEN 325 MG PO TABS
ORAL_TABLET | ORAL | Status: AC
  Filled 2019-11-21: qty 2

## 2019-11-21 MED ORDER — DIPHENHYDRAMINE HCL 25 MG PO CAPS
ORAL_CAPSULE | ORAL | Status: AC
  Filled 2019-11-21: qty 1

## 2019-11-21 MED ORDER — ACETAMINOPHEN 325 MG PO TABS
650.0000 mg | ORAL_TABLET | Freq: Once | ORAL | Status: AC
  Administered 2019-11-21: 650 mg via ORAL

## 2019-11-21 MED ORDER — DIPHENHYDRAMINE HCL 25 MG PO CAPS
25.0000 mg | ORAL_CAPSULE | Freq: Once | ORAL | Status: AC
  Administered 2019-11-21: 25 mg via ORAL

## 2019-11-21 MED ORDER — SODIUM CHLORIDE 0.9% IV SOLUTION: Freq: Once | INTRAVENOUS | Status: AC

## 2019-11-22 ENCOUNTER — Encounter (HOSPITAL_COMMUNITY): Payer: Self-pay

## 2019-11-22 ENCOUNTER — Other Ambulatory Visit: Payer: Self-pay

## 2019-11-22 ENCOUNTER — Encounter (INDEPENDENT_AMBULATORY_CARE_PROVIDER_SITE_OTHER): Payer: Medicare Other | Admitting: Ophthalmology

## 2019-11-22 DIAGNOSIS — Z992 Dependence on renal dialysis: Secondary | ICD-10-CM | POA: Diagnosis not present

## 2019-11-22 DIAGNOSIS — N186 End stage renal disease: Secondary | ICD-10-CM | POA: Diagnosis not present

## 2019-11-22 LAB — BPAM RBC
Blood Product Expiration Date: 202107232359
ISSUE DATE / TIME: 202106221235
Unit Type and Rh: 5100

## 2019-11-22 LAB — TYPE AND SCREEN
ABO/RH(D): O POS
Antibody Screen: NEGATIVE
Unit division: 0

## 2019-11-23 ENCOUNTER — Inpatient Hospital Stay (HOSPITAL_COMMUNITY): Payer: Medicare Other

## 2019-11-23 ENCOUNTER — Inpatient Hospital Stay (HOSPITAL_COMMUNITY): Payer: Medicare Other | Attending: Oncology | Admitting: Oncology

## 2019-11-23 ENCOUNTER — Encounter (INDEPENDENT_AMBULATORY_CARE_PROVIDER_SITE_OTHER): Payer: Medicare Other | Admitting: Ophthalmology

## 2019-11-23 VITALS — BP 152/61 | HR 93 | Temp 98.2°F | Resp 18 | Ht 65.5 in | Wt 257.7 lb

## 2019-11-23 DIAGNOSIS — I120 Hypertensive chronic kidney disease with stage 5 chronic kidney disease or end stage renal disease: Secondary | ICD-10-CM

## 2019-11-23 DIAGNOSIS — K7031 Alcoholic cirrhosis of liver with ascites: Secondary | ICD-10-CM

## 2019-11-23 DIAGNOSIS — D61818 Other pancytopenia: Secondary | ICD-10-CM | POA: Diagnosis not present

## 2019-11-23 DIAGNOSIS — R195 Other fecal abnormalities: Secondary | ICD-10-CM

## 2019-11-23 DIAGNOSIS — K7030 Alcoholic cirrhosis of liver without ascites: Secondary | ICD-10-CM

## 2019-11-23 DIAGNOSIS — D500 Iron deficiency anemia secondary to blood loss (chronic): Secondary | ICD-10-CM

## 2019-11-23 DIAGNOSIS — Z9981 Dependence on supplemental oxygen: Secondary | ICD-10-CM

## 2019-11-23 DIAGNOSIS — Z6841 Body Mass Index (BMI) 40.0 and over, adult: Secondary | ICD-10-CM

## 2019-11-23 DIAGNOSIS — Z992 Dependence on renal dialysis: Secondary | ICD-10-CM

## 2019-11-23 DIAGNOSIS — I10 Essential (primary) hypertension: Secondary | ICD-10-CM

## 2019-11-23 DIAGNOSIS — N186 End stage renal disease: Secondary | ICD-10-CM

## 2019-11-23 DIAGNOSIS — Z800 Family history of malignant neoplasm of digestive organs: Secondary | ICD-10-CM

## 2019-11-23 DIAGNOSIS — Z803 Family history of malignant neoplasm of breast: Secondary | ICD-10-CM

## 2019-11-23 DIAGNOSIS — I8510 Secondary esophageal varices without bleeding: Secondary | ICD-10-CM

## 2019-11-23 DIAGNOSIS — Z87891 Personal history of nicotine dependence: Secondary | ICD-10-CM

## 2019-11-23 LAB — CBC WITH DIFFERENTIAL/PLATELET
Abs Immature Granulocytes: 0 10*3/uL (ref 0.00–0.07)
Basophils Absolute: 0 10*3/uL (ref 0.0–0.1)
Basophils Relative: 1 %
Eosinophils Absolute: 0.4 10*3/uL (ref 0.0–0.5)
Eosinophils Relative: 11 %
HCT: 25 % — ABNORMAL LOW (ref 39.0–52.0)
Hemoglobin: 7.3 g/dL — ABNORMAL LOW (ref 13.0–17.0)
Immature Granulocytes: 0 %
Lymphocytes Relative: 8 %
Lymphs Abs: 0.3 10*3/uL — ABNORMAL LOW (ref 0.7–4.0)
MCH: 28.9 pg (ref 26.0–34.0)
MCHC: 29.2 g/dL — ABNORMAL LOW (ref 30.0–36.0)
MCV: 98.8 fL (ref 80.0–100.0)
Monocytes Absolute: 0.3 10*3/uL (ref 0.1–1.0)
Monocytes Relative: 8 %
Neutro Abs: 2.3 10*3/uL (ref 1.7–7.7)
Neutrophils Relative %: 72 %
Platelets: 126 10*3/uL — ABNORMAL LOW (ref 150–400)
RBC: 2.53 MIL/uL — ABNORMAL LOW (ref 4.22–5.81)
RDW: 17.3 % — ABNORMAL HIGH (ref 11.5–15.5)
WBC: 3.2 10*3/uL — ABNORMAL LOW (ref 4.0–10.5)
nRBC: 0 % (ref 0.0–0.2)

## 2019-11-23 LAB — COMPREHENSIVE METABOLIC PANEL
ALT: 21 U/L (ref 0–44)
AST: 24 U/L (ref 15–41)
Albumin: 2.9 g/dL — ABNORMAL LOW (ref 3.5–5.0)
Alkaline Phosphatase: 182 U/L — ABNORMAL HIGH (ref 38–126)
Anion gap: 12 (ref 5–15)
BUN: 22 mg/dL (ref 8–23)
CO2: 26 mmol/L (ref 22–32)
Calcium: 8.6 mg/dL — ABNORMAL LOW (ref 8.9–10.3)
Chloride: 93 mmol/L — ABNORMAL LOW (ref 98–111)
Creatinine, Ser: 3.4 mg/dL — ABNORMAL HIGH (ref 0.61–1.24)
GFR calc Af Amer: 20 mL/min — ABNORMAL LOW (ref >60)
GFR calc non Af Amer: 17 mL/min — ABNORMAL LOW (ref >60)
Glucose, Bld: 241 mg/dL — ABNORMAL HIGH (ref 70–99)
Potassium: 3.5 mmol/L (ref 3.5–5.1)
Sodium: 131 mmol/L — ABNORMAL LOW (ref 135–145)
Total Bilirubin: 0.7 mg/dL (ref 0.3–1.2)
Total Protein: 7.1 g/dL (ref 6.5–8.1)

## 2019-11-23 LAB — RETIC PANEL
Immature Retic Fract: 27.2 % — ABNORMAL HIGH (ref 2.3–15.9)
RBC.: 2.55 MIL/uL — ABNORMAL LOW (ref 4.22–5.81)
Retic Count, Absolute: 82.1 10*3/uL (ref 19.0–186.0)
Retic Ct Pct: 3.2 % — ABNORMAL HIGH (ref 0.4–3.1)
Reticulocyte Hemoglobin: 29.8 pg (ref >27.9)

## 2019-11-23 LAB — FOLATE: Folate: 16 ng/mL (ref >5.9)

## 2019-11-23 LAB — FERRITIN: Ferritin: 147 ng/mL (ref 24–336)

## 2019-11-23 LAB — IRON AND TIBC
Iron: 53 ug/dL (ref 45–182)
Saturation Ratios: 15 % — ABNORMAL LOW (ref 17.9–39.5)
TIBC: 365 ug/dL (ref 250–450)
UIBC: 312 ug/dL

## 2019-11-23 LAB — VITAMIN B12: Vitamin B-12: 664 pg/mL (ref 180–914)

## 2019-11-23 LAB — LACTATE DEHYDROGENASE: LDH: 175 U/L (ref 98–192)

## 2019-11-23 NOTE — Progress Notes (Signed)
[facility] Hematology/Oncology Consultation   Name: Jeffrey [name]      MRN: [id number]    Location: Room/bed info not found  Date: 11/23/2019 Time:4:52 PM   REFERRING PHYSICIAN: Anemia  REASON FOR CONSULT: Severe anemia   DIAGNOSIS: Anemia, multifactorial  HISTORY OF PRESENT ILLNESS:   Jeffrey [name] is a 74 y.o. 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 difficile who is referred to the [facility] for severe anemia having required packed red blood cell transfusion in the recent past.  He denies any specific complaints today but does admit to overall fatigue.  He does admit to intermittent blood in stool.  He did test positive for heme occult blood in 08/2019.  He quit smoking approximately 30 years ago after having a 40 pack year history.  He was a heavy drinker and quit in December 2014.  He is currently followed from a nephrology standpoint by Dr. Theador Hawthorne and has been undergoing dialysis since December 2020 (according to patient and wife).  He is in a wheelchair today and is on oxygen via nasal cannula.  He admits to lower extremity edema.  He otherwise denies any complaints.  He is very jovial and talkative during discussion today.  Review of Systems - General ROS: negative for - chills, fatigue, fever, hot flashes, night sweats or sleep disturbance Psychological ROS: positive for - depression negative for - suicidal ideation Ophthalmic ROS: negative for - blurry vision, decreased vision or double vision ENT ROS: negative for - headaches, oral lesions or sore throat Allergy and Immunology ROS: negative Hematological and Lymphatic ROS: positive for - blood transfusions negative for - blood clots or swollen lymph nodes Endocrine ROS: negative for - malaise/lethargy Breast  ROS: negative Respiratory ROS: negative for - cough or hemoptysis Cardiovascular ROS: positive for - dyspnea on exertion negative for - chest pain Gastrointestinal ROS: positive for - blood in stools and melena Genito-Urinary ROS: no dysuria, trouble voiding, or hematuria Musculoskeletal ROS: positive for - swelling in leg - bilateral Neurological ROS: no TIA or stroke symptoms Dermatological ROS: negative  PAST MEDICAL HISTORY:   Past Medical History:  Diagnosis Date  . A-fib [facility])    when initially stating dialysis january 2021 at [facility] per spouse; never heard anything else about it  . Alcoholic cirrhosis ([place])    patient reports completing hep a and b vaccines in 2006  . Anemia    has had 3 units of prbcs 2013  . Anxiety   . Asymptomatic gallstones    Ultrasound in 2006  . C. difficile colitis   . Cholelithiasis   . Chronic kidney disease    Dialysis M/W/F/Sa  . Depression   . Diabetes ([place])   . Duodenal ulcer with hemorrhage    per patient in 2006 or 2007, records have been requested  . Dyspnea    low oxygen level -  has O2 2 L all day  . 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 GERD (gastroesophageal reflux disease)   . 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 HOH (hard of hearing)   . Hypertension   . Liver cirrhosis ([place])   . Splenomegaly    Ultrasound in 2006    ALLERGIES: Allergies  Allergen Reactions  .  Chlorhexidine   . Tape Itching and Rash      MEDICATIONS: I have reviewed the patient's current medications.    Current Outpatient Medications on File Prior to Visit  Medication Sig Dispense Refill  . Alcohol Swabs (ALCOHOL PREP) 70 % PADS SMARTSIG:1 [place]get(s) Topical 3 Times Daily    . Ascorbic Acid (VITAMIN C) 1000 MG tablet Take 1,000 mg by mouth 2 (two) times daily.    . cholestyramine (QUESTRAN) 4 GM/DOSE powder Take 4 g by mouth in the morning.     . furosemide (LASIX) 80 MG tablet Take 80 mg by mouth in the morning.     .  gabapentin (NEURONTIN) 100 MG capsule TAKE 1 CAPSULE BY MOUTH AT BEDTIME 90 capsule 0  . HUMALOG KWIKPEN 100 UNIT/ML KwikPen as needed. Sliding scale    . HYDROcodone-acetaminophen (NORCO) 10-325 MG tablet Take 1 tablet by mouth every 6 (six) hours as needed for moderate pain. (Patient taking differently: Take 1 tablet by mouth as needed for moderate pain. ) 20 tablet 0  . hydrOXYzine (VISTARIL) 25 MG capsule Take 25 mg by mouth in the morning and at bedtime.     . insulin degludec (TRESIBA FLEXTOUCH) 200 UNIT/ML FlexTouch Pen Inject 120 Units into the skin at bedtime. When blood sugars are up    . iron polysaccharides (NIFEREX) 150 MG capsule Take 150 mg by mouth at bedtime.     . metoprolol succinate (TOPROL-XL) 25 MG 24 hr tablet Take 25 mg by mouth 2 (two) times daily.    . milk thistle 175 MG tablet Take 175 mg by mouth 3 (three) times daily.     . mirtazapine (REMERON) 15 MG tablet Take 15 mg by mouth at bedtime.    . Multiple Vitamin (MULTIVITAMIN WITH MINERALS) TABS tablet Take 1 tablet by mouth in the morning.     [name] NOVOFINE 32G X 6 MM MISC USE DAILY AS NEEDED (MAX 12 SHOTS PER DAY)    . omega-3 acid ethyl esters (LOVAZA) 1 g capsule Take 2 g by mouth 2 (two) times daily.     [name] omeprazole (PRILOSEC) 40 MG capsule Take 40 mg by mouth in the morning.     . pravastatin (PRAVACHOL) 20 MG tablet Take 20 mg by mouth in the morning.     . Probiotic Product (PROBIOTIC DAILY PO) Take 1 tablet by mouth in the morning.     . rifaximin (XIFAXAN) 550 MG TABS tablet Take 1 tablet (550 mg total) by mouth 2 (two) times daily. 60 tablet 11  . spironolactone (ALDACTONE) 50 MG tablet Take 50 mg by mouth in the morning.     . tamsulosin (FLOMAX) 0.4 MG CAPS capsule Take 0.4 mg by mouth every evening.    . thiamine 100 MG tablet Take 100 mg by mouth in the morning. Vitamin B-1    . tiZANidine (ZANAFLEX) 2 MG tablet Take 2 mg by mouth as needed for muscle spasms.     [name] triamcinolone cream (KENALOG) 0.1 %  Apply 1 application topically 2 (two) times daily as needed (dry skin Itching).   3  . venlafaxine (EFFEXOR) 75 MG tablet Take 75 mg by mouth 3 (three) times daily with meals.      No current facility-administered medications on file prior to visit.     PAST SURGICAL HISTORY Past Surgical History:  Procedure Laterality Date  . AGILE CAPSULE N/A 09/20/2019   Procedure: AGILE CAPSULE;  Surgeon: Daneil Dolin, MD;  [facility];  Service: Endoscopy;  Laterality: N/A;  . APPENDECTOMY    . AV FISTULA PLACEMENT Left 08/17/2019   Procedure: ARTERIOVENOUS (AV) FISTULA CREATION VERSES ARTERIOVENOUS GRAFT;  Surgeon: [name], MD;  Location: [place];  Service: Vascular;  Laterality: Left;  . BASCILIC VEIN TRANSPOSITION Left 09/28/2019   Procedure: LEFT SECOND STAGE BASCILIC VEIN TRANSPOSITION;  Surgeon: [name], MD;  Location: [place];  Service: Vascular;  Laterality: Left;  . BIOPSY  02/03/2018   Procedure: BIOPSY;  Surgeon: Daneil Dolin, MD;  [facility];  Service: Endoscopy;;  bx of gastric polyps  . BIOPSY  09/19/2019   Procedure: BIOPSY;  Surgeon: Danie Binder, MD;  [facility];  Service: Endoscopy;;  . CATARACT EXTRACTION Right   . COLONOSCOPY  03/10/2005   Rectal polyp as described above, removed with snare. Left sided  diverticula. The remainder of the colonic mucosa appeared normal. Inflammed polyp on path.  . COLONOSCOPY  04/1999   Dr. Thea Silversmith polyps removed,  Path showed hyperplastic  . COLONOSCOPY  10/2015   Dr. Britta Mccreedy: diverticulosis, single sessile tubular adenoma 3-65m in size removed from descending colon.   . COLONOSCOPY WITH ESOPHAGOGASTRODUODENOSCOPY (EGD)  03/31/2012   RGGE:ZMOQHUTAVMs. Colonic diverticulosis. tubular adenoma colon  . COLONOSCOPY WITH ESOPHAGOGASTRODUODENOSCOPY (EGD)  06/2019   FORSYTH: small esophageal varices without high risk stigmata, single large AVM on lesser curvature in gastric body s/p APC ablation,  gastric antral and duodenal bulb polyposis. No significant source to explain transfusion dependent anemia. Colonoscopy with portal colopathy and diffuse edema, changes of Cdiff colitis on colonoscopy  . ESOPHAGEAL BANDING N/A 02/03/2018   Procedure: ESOPHAGEAL BANDING;  Surgeon: RDaneil Dolin MD;  [facility];  Service: Endoscopy;  Laterality: N/A;  . ESOPHAGOGASTRODUODENOSCOPY  01/15/2005   Three columns grade 1 to 2 esophageal varices, otherwise normal esophageal mucosa.  Esophagus was not manipulated otherwise./Nodularity of the antrum with overlying erosions, nonspecific finding. Path showed rare H.pylori  . ESOPHAGOGASTRODUODENOSCOPY  09/2008   Dr. RGaylene Brookscolumns of grade 2-3 esoph varices, only one column was prominent. Portal gastropathy, multiple gastrc polyps at antrum, two were 2cm with black eschar, bulbar polyps, bulbar erosions  . ESOPHAGOGASTRODUODENOSCOPY  11/2004   Dr. FBrantley Stage3 esoph varices  . ESOPHAGOGASTRODUODENOSCOPY  03/31/2012   RMR: 4 columns(3-GR 2, 1-GR1) non-bleeding esophageal varices, portal gastropathy, small HH, early GAVE, multiple gastric polyps   . ESOPHAGOGASTRODUODENOSCOPY (EGD) WITH PROPOFOL N/A 02/03/2018   Dr. RGala Romney Esophageal varices, 3 columns grade 1-2.  Portal hypertensive gastropathy.  Multiple gastric polyps, biopsy consistent with hyperplastic.  .Marland KitchenESOPHAGOGASTRODUODENOSCOPY (EGD) WITH PROPOFOL N/A 09/19/2019   Fields: grade I and II esophageal varcies, mild portal hypertensive gastropathy, moderate gastritis but no H. pylori, single hyperplastic gastric polyp removed, obvious source for melena/transfusion dependent anemia not identified, may be due to friable gastric and duodenal mucosa in the setting of portal hypertension  . GASTRIC VARICES BANDING  03/31/2012   Procedure: GASTRIC VARICES BANDING;  Surgeon: RDaneil Dolin MD;  [facility];  Service: Endoscopy;  Laterality: N/A;  . GIVENS CAPSULE STUDY N/A  09/21/2019   Poor study with a lot of debris obstructing much of the view of the first approximate 3 hours out of 6 hours.  No obvious source of bleeding identified.  Sequela of bleeding and old blood seen in the form of"whisps" of blood, blood flecks mostly toward the end of the study.  .Marland KitchenPOLYPECTOMY  09/19/2019  Procedure: POLYPECTOMY;  Surgeon: Danie Binder, MD;  [facility];  Service: Endoscopy;;  . UMBILICAL HERNIA REPAIR  2017   exploratory laparotomy, abdominal washout    FAMILY HISTORY: Family History  Problem Relation Age of Onset  . Colon cancer Father 81       deceased age 80  . Breast cancer Sister        deceased  . Diabetes Sister   . Stroke Mother   . Healthy Son   . Healthy Daughter   . Lung cancer Neg Hx   . Ovarian cancer Neg Hx     SOCIAL HISTORY:  reports that he quit smoking about 33 years ago. His smoking use included cigarettes. He has a 12.50 pack-year smoking history. He has never used smokeless tobacco. He reports that he does not drink alcohol and does not use drugs.  Social History   Socioeconomic History  . Marital status: Married    Spouse name: Not on file  . Number of children: 2  . Years of education: Not on file  . Highest education level: Not on file  Occupational History  . Occupation: RETIRED    Employer: SELF EMPLOYED  Tobacco Use  . Smoking status: Former Smoker    Packs/day: 0.50    Years: 25.00    Pack years: 12.50    Types: Cigarettes    Quit date: 01/28/1986    Years since quitting: 33.8  . Smokeless tobacco: Never Used  Vaping Use  . Vaping Use: Never used  Substance and Sexual Activity  . Alcohol use: No    Comment: quit in 05/2013  . Drug use: No  . Sexual activity: Not Currently  Other Topics Concern  . Not on file  Social History Narrative   Two step children from second marriage (divorced) who live with him along with some grandchildren.    Social Determinants of Health   Financial Resource  Strain:   . Difficulty of Paying Living Expenses:   Food Insecurity:   . Worried About Charity fundraiser in the Last Year:   . Arboriculturist in the Last Year:   Transportation Needs:   . Film/video editor (Medical):   [name] Lack of Transportation (Non-Medical):   Physical Activity:   . Days of Exercise per Week:   . Minutes of Exercise per Session:   Stress:   . Feeling of Stress :   Social Connections:   . Frequency of Communication with Friends and Family:   . Frequency of Social Gatherings with Friends and Family:   . Attends Religious Services:   . Active Member of Clubs or Organizations:   . Attends Archivist Meetings:   [name] Marital Status:     PERFORMANCE STATUS: The patient's performance status is 2 - Symptomatic, <50% confined to bed  PHYSICAL EXAM: Most Recent Vital Signs: Blood pressure (!) 152/61, pulse 93, temperature 98.2 F (36.8 C), temperature source Oral, resp. rate 18, height 5' 5.5" (1.664 m), weight 257 lb 11.2 oz (116.9 kg), SpO2 100 %. BP (!) 152/61 (BP Location: Right Arm, Patient Position: Sitting)   Pulse 93   Temp 98.2 F (36.8 C) (Oral)   Resp 18   Ht 5' 5.5" (1.664 m)   Wt 257 lb 11.2 oz (116.9 kg)   SpO2 100% Comment: 2L  BMI 42.23 kg/m   General Appearance:    Alert, cooperative, no distress, appears stated age, in wheelchair, accompanied by his third  wife, nasal cannula in place for oxygen delivery, obese  Head:    Normocephalic, without obvious abnormality, atraumatic  Eyes:    Conjunctiva/corneas clear, EOM's intact, both eyes       Ears:    Not examined  Nose:   Not examined, mask in place  Throat:   Not examined, mask in place  Neck:   Supple, symmetrical, trachea midline, no adenopathy;       thyroid:  No enlargement/tenderness/nodules.  Back:     Symmetric, no curvature, ROM normal, no CVA tenderness  Lungs:     Decreased breath sounds bilaterally, clear to auscultation bilaterally,   Chest wall:    No tenderness or  deformity  Heart:    Regular rate and rhythm  Abdomen:     Distened, firm, non-tender, distant bowel sounds but active all four quadrants, organomegaly exam hindered due to body habitus and positioning in wheelchair        Extremities:   Bilateral woody infiltration of lower extremities bilaterally.     Skin:   Skin color is pale  Lymph nodes:   Cervical, supraclavicular, and axillary nodes normal  Neurologic:   CNII-XII intact. Normal strength, sensation and reflexes      throughout    LABORATORY DATA:  No results found for this or any previous visit (from the past 48 hour(s)).    RADIOGRAPHY: No results found.     ASSESSMENT/PLAN:   1. 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  GI work-up including EGD in 06/2019 demonstrated esophageal varices with high-risk stigmata, single large AVM and colonoscopy revealed portal colopathy and diffuse edema with changes associated with C. diff colitis.  Camera capsule study on 09/22/2019 was a poort study with debris obstructing many views without identifing any obvious source of bleeding, Documented history of heme + stools.  Next colonoscopy due in 07/2020.  Will begin an anemia work-up including the following labs: CBC diff, CMET, retic count, LDH, haptoglobin, iron/TIBC, ferritin, B12, MMA, and folate.  Will pursue a Multiple myeloma screen given his age and pancytopenia.  He did quit drinking EtOH in 2014.  He likely has multifactorial anemia with a big contributor being his cirrhosis of liver and end-stage renal disease resulting in anemia of chronic renal disease.  We will check vitamin studies as described above but if they are all normal, then we will need to rely on ESA therapy with dialysis to maintain his hemoglobin.  If vitamin deficiency was noted, we would replace  accordingly.  Nevertheless, even if vitamin deficiency is negative, we will need to keep a close eye on his iron studies given his known fecal occult positive testing with esophageal varices and AVMs that are prone to bleed.  We will need to maintain a ferritin greater than 100 and will rely on his iron saturation and TIBC to determine whether he is iron deficient.  His ferritin will be falsely elevated given his other chronic diseases and therefore will play a little utility in determining whether he is iron deficient unless ferritin is obviously low.  Return in 2-3 weeks for follow-up.  Will replace vitamin deficiencies as needed based upon above mentioned lab results.  2. Alcoholic cirrhosis of liver with ascites ([place]) Followed by GI.  Ultrasound and AFP were negative for hepatocellular carcinoma in 08/2019.  Given his cirrhosis of liver, he is  at increased risk for developing hepatocellular carcinoma.  3. Pancytopenia ([place]) Persistent, likely secondary to cirrhosis of liver and complications described above.  4. Morbid obesity ([place]) BMI today is 42.2 kg/m.  5. Heme positive stool Documented in 08/2019  6. Essential hypertension, benign Blood pressure today is 152/61.  Systolic pressure is above goal and I will defer management to primary care provider/cardiology.  7. 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[place]) Managed by nephrology and he is receiving intermittent iron with dialysis and ESA support.  8. Esophageal varices in alcoholic cirrhosis ([place]) Secondary to cirrhosis of liver.  He is at increased risk of bleeding resulting in iron deficiency as described above.  ORDERS PLACED FOR THIS ENCOUNTER: Orders Placed This Encounter  Procedures  . CBC with Differential  . Comprehensive metabolic panel  . Retic Panel  . Lactate dehydrogenase  . Haptoglobin  . Iron and TIBC  . Ferritin  . Vitamin B12  . Folate  . Methylmalonic acid, serum  . Multiple myeloma panel, serum  . Kappa/lambda  light chains    MEDICATIONS PRESCRIBED THIS ENCOUNTER: No orders of the defined types were placed in this encounter.   All questions were answered. The patient knows to call the clinic with any problems, questions or concerns. We can certainly see the patient much sooner if necessary.  Patient discussed with Dr. Earlie Server and together we ascertained an up-to-date interval history, and examined the patient.  Dr. Earlie Server developed the patient's assessment and plan.  This was a shared visit-consultation.  His attestation will follow.  This note is electronically signed by: Robynn Pane, PA-C 11/23/2019 4:52 PM  ADDENDUM:  Hematology/Oncology Attending: I had a face-to-face video virtual meeting with the patient today.  I recommended his care plan and I agree with the above note.  This is a very pleasant 74 years old white male with multiple medical problems including alcohol abuse, liver cirrhosis, hepatorenal syndrome as well as end-stage renal disease.  The patient has been complaining of persistent anemia for long time.  He was referred to [place] for evaluation of his anemia.  He had previous gastrointestinal blood work that showed evidence for esophageal varices as well as AV malformation. His anemia is multifactorial including anemia of chronic disease secondary to his renal insufficiency in addition to the liver cirrhosis and gastrointestinal bleeding. We will order several studies for further evaluation of his anemia including repeat iron study, ferritin, serum protein electrophoresis with immunofixation, vitamin B12 level, serum folate as well as hemolytic anemia marker like haptoglobin, LDH and reticulocyte count. The patient will come back for follow-up visit in around 2 weeks for further evaluation and discussion of his treatment options based on the pending lab results. He may benefit from continuous treatment with ESA during dialysis as well as iron supplements. He  was advised to call immediately if he has any concerning symptoms in the interval.  Disclaimer: This note was dictated with voice recognition software. Similar sounding words can inadvertently be transcribed and may be missed upon review. Eilleen Kempf, MD 11/24/19

## 2019-11-24 ENCOUNTER — Encounter (HOSPITAL_COMMUNITY): Payer: Self-pay | Admitting: Oncology

## 2019-11-24 DIAGNOSIS — Z992 Dependence on renal dialysis: Secondary | ICD-10-CM | POA: Diagnosis not present

## 2019-11-24 DIAGNOSIS — N186 End stage renal disease: Secondary | ICD-10-CM | POA: Diagnosis not present

## 2019-11-24 LAB — HAPTOGLOBIN: Haptoglobin: 150 mg/dL (ref 34–355)

## 2019-11-24 LAB — KAPPA/LAMBDA LIGHT CHAINS
Kappa free light chain: 237.3 mg/L — ABNORMAL HIGH (ref 3.3–19.4)
Kappa, lambda light chain ratio: 1.95 — ABNORMAL HIGH (ref 0.26–1.65)
Lambda free light chains: 121.6 mg/L — ABNORMAL HIGH (ref 5.7–26.3)

## 2019-11-26 DIAGNOSIS — I509 Heart failure, unspecified: Secondary | ICD-10-CM | POA: Diagnosis not present

## 2019-11-26 DIAGNOSIS — R0602 Shortness of breath: Secondary | ICD-10-CM | POA: Diagnosis not present

## 2019-11-26 LAB — METHYLMALONIC ACID, SERUM: Methylmalonic Acid, Quantitative: 672 nmol/L — ABNORMAL HIGH (ref 0–378)

## 2019-11-27 DIAGNOSIS — Z992 Dependence on renal dialysis: Secondary | ICD-10-CM | POA: Diagnosis not present

## 2019-11-27 DIAGNOSIS — I10 Essential (primary) hypertension: Secondary | ICD-10-CM | POA: Diagnosis not present

## 2019-11-27 DIAGNOSIS — E1165 Type 2 diabetes mellitus with hyperglycemia: Secondary | ICD-10-CM | POA: Diagnosis not present

## 2019-11-27 DIAGNOSIS — K8020 Calculus of gallbladder without cholecystitis without obstruction: Secondary | ICD-10-CM | POA: Diagnosis not present

## 2019-11-27 DIAGNOSIS — Z299 Encounter for prophylactic measures, unspecified: Secondary | ICD-10-CM | POA: Diagnosis not present

## 2019-11-27 LAB — MULTIPLE MYELOMA PANEL, SERUM
Albumin SerPl Elph-Mcnc: 3 g/dL (ref 2.9–4.4)
Albumin/Glob SerPl: 0.9 (ref 0.7–1.7)
Alpha 1: 0.4 g/dL (ref 0.0–0.4)
Alpha2 Glob SerPl Elph-Mcnc: 0.7 g/dL (ref 0.4–1.0)
B-Globulin SerPl Elph-Mcnc: 1.2 g/dL (ref 0.7–1.3)
Gamma Glob SerPl Elph-Mcnc: 1.3 g/dL (ref 0.4–1.8)
Globulin, Total: 3.6 g/dL (ref 2.2–3.9)
IgA: 452 mg/dL — ABNORMAL HIGH (ref 61–437)
IgG (Immunoglobin G), Serum: 1313 mg/dL (ref 603–1613)
IgM (Immunoglobulin M), Srm: 105 mg/dL (ref 15–143)
Total Protein ELP: 6.6 g/dL (ref 6.0–8.5)

## 2019-11-28 DIAGNOSIS — R161 Splenomegaly, not elsewhere classified: Secondary | ICD-10-CM | POA: Diagnosis not present

## 2019-11-28 DIAGNOSIS — K7460 Unspecified cirrhosis of liver: Secondary | ICD-10-CM | POA: Diagnosis not present

## 2019-11-28 DIAGNOSIS — R5383 Other fatigue: Secondary | ICD-10-CM | POA: Diagnosis not present

## 2019-11-28 DIAGNOSIS — K8020 Calculus of gallbladder without cholecystitis without obstruction: Secondary | ICD-10-CM | POA: Diagnosis not present

## 2019-11-28 DIAGNOSIS — Q600 Renal agenesis, unilateral: Secondary | ICD-10-CM | POA: Diagnosis not present

## 2019-11-28 DIAGNOSIS — R188 Other ascites: Secondary | ICD-10-CM | POA: Diagnosis not present

## 2019-11-29 DIAGNOSIS — I10 Essential (primary) hypertension: Secondary | ICD-10-CM | POA: Diagnosis not present

## 2019-11-29 DIAGNOSIS — N2581 Secondary hyperparathyroidism of renal origin: Secondary | ICD-10-CM | POA: Diagnosis not present

## 2019-11-29 DIAGNOSIS — E119 Type 2 diabetes mellitus without complications: Secondary | ICD-10-CM | POA: Diagnosis not present

## 2019-11-29 DIAGNOSIS — N186 End stage renal disease: Secondary | ICD-10-CM | POA: Diagnosis not present

## 2019-11-29 DIAGNOSIS — Z992 Dependence on renal dialysis: Secondary | ICD-10-CM | POA: Diagnosis not present

## 2019-11-29 DIAGNOSIS — Z794 Long term (current) use of insulin: Secondary | ICD-10-CM | POA: Diagnosis not present

## 2019-11-29 DIAGNOSIS — D509 Iron deficiency anemia, unspecified: Secondary | ICD-10-CM | POA: Diagnosis not present

## 2019-11-29 DIAGNOSIS — I5022 Chronic systolic (congestive) heart failure: Secondary | ICD-10-CM | POA: Diagnosis not present

## 2019-12-01 DIAGNOSIS — N186 End stage renal disease: Secondary | ICD-10-CM | POA: Diagnosis not present

## 2019-12-01 DIAGNOSIS — Z992 Dependence on renal dialysis: Secondary | ICD-10-CM | POA: Diagnosis not present

## 2019-12-01 DIAGNOSIS — E878 Other disorders of electrolyte and fluid balance, not elsewhere classified: Secondary | ICD-10-CM | POA: Diagnosis not present

## 2019-12-04 DIAGNOSIS — N186 End stage renal disease: Secondary | ICD-10-CM | POA: Diagnosis not present

## 2019-12-04 DIAGNOSIS — Z992 Dependence on renal dialysis: Secondary | ICD-10-CM | POA: Diagnosis not present

## 2019-12-05 ENCOUNTER — Other Ambulatory Visit (HOSPITAL_COMMUNITY): Payer: Self-pay | Admitting: Nephrology

## 2019-12-05 DIAGNOSIS — N186 End stage renal disease: Secondary | ICD-10-CM

## 2019-12-06 DIAGNOSIS — D509 Iron deficiency anemia, unspecified: Secondary | ICD-10-CM | POA: Diagnosis not present

## 2019-12-06 DIAGNOSIS — N186 End stage renal disease: Secondary | ICD-10-CM | POA: Diagnosis not present

## 2019-12-06 DIAGNOSIS — Z992 Dependence on renal dialysis: Secondary | ICD-10-CM | POA: Diagnosis not present

## 2019-12-07 ENCOUNTER — Other Ambulatory Visit: Payer: Self-pay

## 2019-12-07 ENCOUNTER — Telehealth (HOSPITAL_COMMUNITY): Payer: Self-pay

## 2019-12-07 ENCOUNTER — Inpatient Hospital Stay (HOSPITAL_COMMUNITY): Payer: Medicare Other | Attending: Oncology | Admitting: Hematology

## 2019-12-07 VITALS — BP 144/57 | HR 86 | Temp 97.3°F | Resp 18 | Wt 253.7 lb

## 2019-12-07 DIAGNOSIS — E538 Deficiency of other specified B group vitamins: Secondary | ICD-10-CM | POA: Insufficient documentation

## 2019-12-07 DIAGNOSIS — D500 Iron deficiency anemia secondary to blood loss (chronic): Secondary | ICD-10-CM

## 2019-12-07 MED ORDER — CYANOCOBALAMIN 1000 MCG/ML IJ SOLN
1000.0000 ug | Freq: Once | INTRAMUSCULAR | Status: AC
  Administered 2019-12-07: 1000 ug via INTRAMUSCULAR
  Filled 2019-12-07: qty 1

## 2019-12-07 NOTE — Telephone Encounter (Signed)
Gave pt's wife appt info. AW

## 2019-12-07 NOTE — Progress Notes (Signed)
[place], [place] [address]   CLINIC:  Medical Oncology/Hematology  PCP:  Monico Blitz, [place] [address]  [phone]  REASON FOR VISIT:  Follow-up for anemia.  PRIOR THERAPY: None  CURRENT THERAPY: Under work-up  INTERVAL HISTORY:  Jeffrey [name], a 74 y.o. 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 Yohance was last seen by Robynn Pane on 11/23/2019.  He needed to get a blood transfusion on 11/22/2019. Today he is accompanied by his wife. He denies having any hematochezia or hematuria. The last time he had hemoptysis was about 10 years ago. He goes to dialysis on M/W/F. He walks around home with a walker due to imbalance, but denies dizziness.   REVIEW OF SYSTEMS:  Review of Systems  Constitutional: Positive for fatigue. Negative for appetite change.  HENT:   Positive for trouble swallowing.   Respiratory: Positive for shortness of breath. Negative for hemoptysis.   Gastrointestinal: Positive for abdominal pain. Negative for blood in stool.  Genitourinary: Positive for difficulty urinating (on dialysis). Negative for hematuria.   Musculoskeletal: Positive for back pain (8/10 low back pain) and gait problem (imbalance).  Neurological: Positive for gait problem (imbalance), headaches and numbness. Negative for dizziness.  Hematological: Does not bruise/bleed easily.  Psychiatric/Behavioral: Positive for depression and sleep disturbance. The patient is nervous/anxious.   All other systems reviewed and are negative.   PAST MEDICAL/SURGICAL HISTORY:  Past Medical History:  Diagnosis Date  . A-fib [facility])    when initially stating dialysis january 2021 at [facility] per spouse; never heard anything else about it  . Alcoholic cirrhosis ([place])    patient reports completing hep a and b vaccines in 2006  . Anemia    has had 3 units of prbcs 2013  . Anxiety   . Asymptomatic gallstones    Ultrasound  in 2006  . C. difficile colitis   . Cholelithiasis   . Chronic kidney disease    Dialysis M/W/F/Sa  . Depression   . Diabetes ([place])   . Duodenal ulcer with hemorrhage    per patient in 2006 or 2007, records have been requested  . Dyspnea    low oxygen level -  has O2 2 L all day  . 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 GERD (gastroesophageal reflux disease)   . 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 HOH (hard of hearing)   . Hypertension   . 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 Splenomegaly    Ultrasound in 2006   Past Surgical History:  Procedure Laterality Date  . AGILE CAPSULE N/A 09/20/2019   Procedure: AGILE CAPSULE;  Surgeon: Daneil Dolin, MD;  [facility];  Service: Endoscopy;  Laterality: N/A;  . APPENDECTOMY    . AV FISTULA PLACEMENT Left 08/17/2019   Procedure: ARTERIOVENOUS (AV) FISTULA CREATION VERSES ARTERIOVENOUS GRAFT;  Surgeon: [name], MD;  Location: [place];  Service: Vascular;  Laterality: Left;  . BASCILIC VEIN TRANSPOSITION Left 09/28/2019   Procedure: LEFT SECOND STAGE BASCILIC VEIN TRANSPOSITION;  Surgeon: [name], MD;  Location: [facility];  Service: Vascular;  Laterality: Left;  . BIOPSY  02/03/2018   Procedure: BIOPSY;  Surgeon: Daneil Dolin, MD;  [facility];  Service: Endoscopy;;  bx of gastric polyps  . BIOPSY  09/19/2019   Procedure: BIOPSY;  Surgeon: Danie Binder, MD;  [facility];  Service: Endoscopy;;  . CATARACT EXTRACTION Right   . COLONOSCOPY  03/10/2005   Rectal polyp as described above, removed with snare. Left sided  diverticula. The remainder of the colonic mucosa appeared normal. Inflammed polyp on path.  . COLONOSCOPY  04/1999   Dr. Thea Silversmith polyps removed,  Path showed hyperplastic  . COLONOSCOPY  10/2015   Dr. Britta Mccreedy: diverticulosis, single sessile tubular adenoma 3-91mm in size removed from descending colon.   . COLONOSCOPY WITH ESOPHAGOGASTRODUODENOSCOPY (EGD)  03/31/2012   HKV:[id number]  AVMs. Colonic diverticulosis. tubular adenoma colon  . COLONOSCOPY WITH ESOPHAGOGASTRODUODENOSCOPY (EGD)  06/2019   FORSYTH: small esophageal varices without high risk stigmata, single large AVM on lesser curvature in gastric body s/p APC ablation, gastric antral and duodenal bulb polyposis. No significant source to explain transfusion dependent anemia. Colonoscopy with portal colopathy and diffuse edema, changes of Cdiff colitis on colonoscopy  . ESOPHAGEAL BANDING N/A 02/03/2018   Procedure: ESOPHAGEAL BANDING;  Surgeon: Daneil Dolin, MD;  [facility];  Service: Endoscopy;  Laterality: N/A;  . ESOPHAGOGASTRODUODENOSCOPY  01/15/2005   Three columns grade 1 to 2 esophageal varices, otherwise normal esophageal mucosa.  Esophagus was not manipulated otherwise./Nodularity of the antrum with overlying erosions, nonspecific finding. Path showed rare H.pylori  . ESOPHAGOGASTRODUODENOSCOPY  09/2008   Dr. Gaylene Brooks columns of grade 2-3 esoph varices, only one column was prominent. Portal gastropathy, multiple gastrc polyps at antrum, two were 2cm with black eschar, bulbar polyps, bulbar erosions  . ESOPHAGOGASTRODUODENOSCOPY  11/2004   Dr. Brantley Stage 3 esoph varices  . ESOPHAGOGASTRODUODENOSCOPY  03/31/2012   RMR: 4 columns(3-GR 2, 1-GR1) non-bleeding esophageal varices, portal gastropathy, small HH, early GAVE, multiple gastric polyps   . ESOPHAGOGASTRODUODENOSCOPY (EGD) WITH PROPOFOL N/A 02/03/2018   Dr. Gala Romney: Esophageal varices, 3 columns grade 1-2.  Portal hypertensive gastropathy.  Multiple gastric polyps, biopsy consistent with hyperplastic.  [name] ESOPHAGOGASTRODUODENOSCOPY (EGD) WITH PROPOFOL N/A 09/19/2019   Fields: grade I and II esophageal varcies, mild portal hypertensive gastropathy, moderate gastritis but no H. pylori, single hyperplastic gastric polyp removed, obvious source for melena/transfusion dependent anemia not identified, may be due to friable gastric and duodenal  mucosa in the setting of portal hypertension  . GASTRIC VARICES BANDING  03/31/2012   Procedure: GASTRIC VARICES BANDING;  Surgeon: Daneil Dolin, MD;  [facility];  Service: Endoscopy;  Laterality: N/A;  . GIVENS CAPSULE STUDY N/A 09/21/2019   Poor study with a lot of debris obstructing much of the view of the first approximate 3 hours out of 6 hours.  No obvious source of bleeding identified.  Sequela of bleeding and old blood seen in the form of"whisps" of blood, blood flecks mostly toward the end of the study.  [name] POLYPECTOMY  09/19/2019   Procedure: POLYPECTOMY;  Surgeon: Danie Binder, MD;  [facility];  Service: Endoscopy;;  . UMBILICAL HERNIA REPAIR  2017   exploratory laparotomy, abdominal washout    SOCIAL HISTORY:  Social History   Socioeconomic History  . Marital status: Married    Spouse name: Not on file  . Number of children: 2  . Years of education: Not on file  . Highest education level: Not on file  Occupational History  . Occupation: RETIRED    Employer: SELF EMPLOYED  Tobacco Use  . Smoking status: Former Smoker    Packs/day: 0.50    Years: 25.00    Pack years: 12.50    Types: Cigarettes  Quit date: 01/28/1986    Years since quitting: 33.8  . Smokeless tobacco: Never Used  Vaping Use  . Vaping Use: Never used  Substance and Sexual Activity  . Alcohol use: No    Comment: quit in 05/2013  . Drug use: No  . Sexual activity: Not Currently  Other Topics Concern  . Not on file  Social History Narrative   Two step children from second marriage (divorced) who live with him along with some grandchildren.    Social Determinants of Health   Financial Resource Strain:   . Difficulty of Paying Living Expenses:   Food Insecurity:   . Worried About Charity fundraiser in the Last Year:   . Arboriculturist in the Last Year:   Transportation Needs:   . Film/video editor (Medical):   [name] Lack of Transportation (Non-Medical):     Physical Activity:   . Days of Exercise per Week:   . Minutes of Exercise per Session:   Stress:   . Feeling of Stress :   Social Connections:   . Frequency of Communication with Friends and Family:   . Frequency of Social Gatherings with Friends and Family:   . Attends Religious Services:   . Active Member of Clubs or Organizations:   . Attends Archivist Meetings:   [name] Marital Status:   Intimate Partner Violence:   . Fear of Current or Ex-Partner:   . Emotionally Abused:   [name] Physically Abused:   . Sexually Abused:     FAMILY HISTORY:  Family History  Problem Relation Age of Onset  . Colon cancer Father 58       deceased age 71  . Breast cancer Sister        deceased  . Diabetes Sister   . Stroke Mother   . Healthy Son   . Healthy Daughter   . Lung cancer Neg Hx   . Ovarian cancer Neg Hx     CURRENT MEDICATIONS:  Current Outpatient Medications  Medication Sig Dispense Refill  . Alcohol Swabs (ALCOHOL PREP) 70 % PADS SMARTSIG:1 Pledget(s) Topical 3 Times Daily    . Ascorbic Acid (VITAMIN C) 1000 MG tablet Take 1,000 mg by mouth 2 (two) times daily.    . cholestyramine (QUESTRAN) 4 GM/DOSE powder Take 4 g by mouth in the morning.     . furosemide (LASIX) 80 MG tablet Take 80 mg by mouth in the morning.     . gabapentin (NEURONTIN) 100 MG capsule TAKE 1 CAPSULE BY MOUTH AT BEDTIME 90 capsule 0  . HUMALOG KWIKPEN 100 UNIT/ML KwikPen as needed. Sliding scale    . HYDROcodone-acetaminophen (NORCO) 10-325 MG tablet Take 1 tablet by mouth every 6 (six) hours as needed for moderate pain. (Patient taking differently: Take 1 tablet by mouth as needed for moderate pain. ) 20 tablet 0  . hydrOXYzine (VISTARIL) 25 MG capsule Take 25 mg by mouth in the morning and at bedtime.     . insulin degludec (TRESIBA FLEXTOUCH) 200 UNIT/ML FlexTouch Pen Inject 120 Units into the skin at bedtime. When blood sugars are up    . iron polysaccharides (NIFEREX) 150 MG capsule Take 150 mg  by mouth at bedtime.     . metoprolol succinate (TOPROL-XL) 25 MG 24 hr tablet Take 25 mg by mouth 2 (two) times daily.    . milk thistle 175 MG tablet Take 175 mg by mouth 3 (three) times daily.     [name]  mirtazapine (REMERON) 15 MG tablet Take 15 mg by mouth at bedtime.    . Multiple Vitamin (MULTIVITAMIN WITH MINERALS) TABS tablet Take 1 tablet by mouth in the morning.     [name] NOVOFINE 32G X 6 MM MISC USE DAILY AS NEEDED (MAX 12 SHOTS PER DAY)    . omega-3 acid ethyl esters (LOVAZA) 1 g capsule Take 2 g by mouth 2 (two) times daily.     [name] omeprazole (PRILOSEC) 40 MG capsule Take 40 mg by mouth in the morning.     . pravastatin (PRAVACHOL) 20 MG tablet Take 20 mg by mouth in the morning.     . Probiotic Product (PROBIOTIC DAILY PO) Take 1 tablet by mouth in the morning.     . rifaximin (XIFAXAN) 550 MG TABS tablet Take 1 tablet (550 mg total) by mouth 2 (two) times daily. 60 tablet 11  . spironolactone (ALDACTONE) 50 MG tablet Take 50 mg by mouth in the morning.     . tamsulosin (FLOMAX) 0.4 MG CAPS capsule Take 0.4 mg by mouth every evening.    . thiamine 100 MG tablet Take 100 mg by mouth in the morning. Vitamin B-1    . tiZANidine (ZANAFLEX) 2 MG tablet Take 2 mg by mouth as needed for muscle spasms.     [name] triamcinolone cream (KENALOG) 0.1 % Apply 1 application topically 2 (two) times daily as needed (dry skin Itching).   3  . venlafaxine (EFFEXOR) 75 MG tablet Take 75 mg by mouth 3 (three) times daily with meals.      No current facility-administered medications for this visit.    ALLERGIES:  Allergies  Allergen Reactions  . Chlorhexidine   . Tape Itching and Rash    PHYSICAL EXAM:  Performance status (ECOG): 2 - Symptomatic, <50% confined to bed  Vitals:   12/07/19 1107  BP: (!) 144/57  Pulse: 86  Resp: 18  Temp: (!) 97.3 F (36.3 C)  SpO2: 95%   Wt Readings from Last 3 Encounters:  12/07/19 115.1 kg (253 lb 11.2 oz)  11/23/19 116.9 kg (257 lb 11.2 oz)  11/16/19 117.2 kg  (258 lb 6.4 oz)   Physical Exam Vitals reviewed.  Constitutional:      Appearance: Normal appearance. He is obese.  Cardiovascular:     Rate and Rhythm: Normal rate and regular rhythm.     Pulses: Normal pulses.     Heart sounds: Normal heart sounds.  Pulmonary:     Effort: Pulmonary effort is normal.     Breath sounds: Normal breath sounds.  Abdominal:     General: There is distension.     Palpations: Abdomen is soft.     Tenderness: There is no abdominal tenderness.  Musculoskeletal:     Right lower leg: Edema (2+) present.     Left lower leg: Edema (2+) present.     Comments: A/V fistula on L forearm  Neurological:     General: No focal deficit present.     Mental Status: He is alert and oriented to person, place, and time.  Psychiatric:        Mood and Affect: Mood normal.        Behavior: Behavior normal.     LABORATORY DATA:  I have reviewed the labs as listed.  CBC Latest Ref Rng & Units 11/23/2019 11/21/2019 11/20/2019  WBC 4.0 - 10.5 K/uL 3.2(L) - -  Hemoglobin 13.0 - 17.0 g/dL 7.3(L) 7.4(L) 6.9(LL)  Hematocrit 39 - 52 %  25.0(L) 24.6(L) 23.3(L)  Platelets 150 - 400 K/uL 126(L) - -   CMP Latest Ref Rng & Units 11/23/2019 11/16/2019 09/28/2019  Glucose 70 - 99 mg/dL 241(H) 188(H) 77  BUN 8 - 23 mg/dL 22 17 15   Creatinine 0.61 - 1.24 mg/dL 3.40(H) 2.65(H) 2.30(H)  Sodium 135 - 145 mmol/L 131(L) 133(L) 138  Potassium 3.5 - 5.1 mmol/L 3.5 3.7 3.4(L)  Chloride 98 - 111 mmol/L 93(L) 97(L) 98  CO2 22 - 32 mmol/L 26 28 -  Calcium 8.9 - 10.3 mg/dL 8.6(L) 8.3(L) -  Total Protein 6.5 - 8.1 g/dL 7.1 6.2 -  Total Bilirubin 0.3 - 1.2 mg/dL 0.7 0.5 -  Alkaline Phos 38 - 126 U/L 182(H) - -  AST 15 - 41 U/L 24 20 -  ALT 0 - 44 U/L 21 19 -      Component Value Date/Time   RBC 2.53 (L) 11/23/2019 1341   RBC 2.55 (L) 11/23/2019 1341   MCV 98.8 11/23/2019 1341   MCV 82.0 02/25/2012 1034   MCH 28.9 11/23/2019 1341   MCHC 29.2 (L) 11/23/2019 1341   RDW 17.3 (H) 11/23/2019 1341    LYMPHSABS 0.3 (L) 11/23/2019 1341   MONOABS 0.3 11/23/2019 1341   EOSABS 0.4 11/23/2019 1341   BASOSABS 0.0 11/23/2019 1341   Lab Results  Component Value Date   TOTALPROTELP 6.6 11/23/2019    Lab Results  Component Value Date   KPAFRELGTCHN 237.3 (H) 11/23/2019   LAMBDASER 121.6 (H) 11/23/2019   KAPLAMBRATIO 1.95 (H) 11/23/2019    DIAGNOSTIC IMAGING:  I have independently reviewed the scans and discussed with the patient.   ASSESSMENT:  1.  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 -Labs on 11/23/2019 shows percent saturation of 15 and ferritin of 147.  SPEP and immunofixation is negative. -CTAP on 01/10/2016 showed splenomegaly.  2.  Vitamin B12 deficiency: -Methylmalonic acid elevated at 672.  B12 normal at 664.   PLAN:  1.  Normocytic anemia: -Discussed results of the test from 11/23/2019. -Has anemia from CKD and relative iron deficiency. -Recommended Feraheme weekly x2.  Discussed side effects of Feraheme in detail. -We will also check CBC next week to see if he needs any blood transfusion.  Last blood transfusion was on 11/20/2019. -Return to clinic in 4 weeks with labs.  2.  Vitamin B12 deficiency: -We will give vitamin B12 injection today.  He was instructed to take B12 1 mg tablet daily.  Orders placed this encounter:  No orders of the defined types were placed in this encounter.    Derek Jack, MD [place] [phone]   I, Milinda Antis, am acting as a scribe for Dr. Sanda Linger.  I, Derek Jack MD, have reviewed the above documentation for accuracy and completeness, and I agree with the above.

## 2019-12-07 NOTE — Patient Instructions (Signed)
[place] at [facility] Discharge Instructions  You were seen today by Dr. Delton Coombes. He went over your recent results. Please purchase vitamin B12 over the counter and take 1 mg daily. You will receive a vitamin B12 injection today. You will receive the iron infusion next week. Dr. Delton Coombes will see you back in 4 weeks for labs and follow up.   Thank you for choosing [place] at [facility] to provide your oncology and hematology care.  To afford each patient quality time with our provider, please arrive at least 15 minutes before your scheduled appointment time.   If you have a lab appointment with the [facility] please come in thru the Main Entrance and check in at the main information desk  You need to re-schedule your appointment should you arrive 10 or more minutes late.  We strive to give you quality time with our providers, and arriving late affects you and other patients whose appointments are after yours.  Also, if you no show three or more times for appointments you may be dismissed from the clinic at the providers discretion.     Again, thank you for choosing [facility].  Our hope is that these requests will decrease the amount of time that you wait before being seen by our physicians.       _____________________________________________________________  Should you have questions after your visit to [facility], please contact our office at [phone] between the hours of 8:00 a.m. and 4:30 p.m.  Voicemails left after 4:00 p.m. will not be returned until the following business day.  For prescription refill requests, have your pharmacy contact our office and allow 72 hours.    Cancer Center Support Programs:   > Cancer Support Group  2nd Tuesday of the month 1pm-2pm, Journey Room

## 2019-12-08 DIAGNOSIS — Z992 Dependence on renal dialysis: Secondary | ICD-10-CM | POA: Diagnosis not present

## 2019-12-08 DIAGNOSIS — N186 End stage renal disease: Secondary | ICD-10-CM | POA: Diagnosis not present

## 2019-12-12 ENCOUNTER — Other Ambulatory Visit: Payer: Self-pay

## 2019-12-12 ENCOUNTER — Telehealth: Payer: Self-pay | Admitting: Emergency Medicine

## 2019-12-12 ENCOUNTER — Ambulatory Visit (INDEPENDENT_AMBULATORY_CARE_PROVIDER_SITE_OTHER): Payer: Medicare Other | Admitting: Urology

## 2019-12-12 ENCOUNTER — Encounter: Payer: Self-pay | Admitting: Urology

## 2019-12-12 ENCOUNTER — Ambulatory Visit (HOSPITAL_COMMUNITY): Payer: Medicare Other

## 2019-12-12 ENCOUNTER — Other Ambulatory Visit (HOSPITAL_COMMUNITY): Payer: Medicare Other

## 2019-12-12 VITALS — BP 128/63 | HR 79 | Temp 97.9°F | Ht 67.0 in | Wt 260.0 lb

## 2019-12-12 DIAGNOSIS — R339 Retention of urine, unspecified: Secondary | ICD-10-CM | POA: Diagnosis not present

## 2019-12-12 NOTE — Telephone Encounter (Signed)
Order received from [facility] asking for dx code and signature on order. Placed in providers box

## 2019-12-12 NOTE — Progress Notes (Signed)
Urological Symptom Review  Patient is experiencing the following symptoms: none   Review of Systems  Gastrointestinal (upper)  : Indigestion/heartburn  Gastrointestinal (lower) : Negative for lower GI symptoms  Constitutional : Negative for symptoms  Skin: Itching  Eyes: Negative for eye symptoms  Ear/Nose/Throat : Sinus problems  Hematologic/Lymphatic: Bruise easily  Cardiovascular : Leg swelling  Respiratory : Shortness of breath  Endocrine: Excessive thirst  Musculoskeletal: Back pain  Neurological: Negative for neurological symptoms  Psychologic: Depression Anxiety  Cath Change/ Replacement  Patient is present today for a catheter change due to urinary retention.  77ml of water was removed from the balloon, a 16FR foley cath was removed with out difficulty.  Patient was cleaned and prepped in a sterile fashion with betadine. A 16 FR foley cath was replaced into the bladder no complications were noted Urine return was noted 52ml and urine was yellow in color. The balloon was filled with 51ml of sterile water. A leg bag was attached for drainage.  Patient was given proper instruction on catheter care.    Performed by: Ellon Marasco, LPN  Follow up: 1 month cath change

## 2019-12-12 NOTE — Telephone Encounter (Signed)
Faxed to [facility]

## 2019-12-12 NOTE — Telephone Encounter (Signed)
Completed.

## 2019-12-12 NOTE — Telephone Encounter (Signed)
error 

## 2019-12-12 NOTE — Progress Notes (Signed)
H&P  Chief Complaint: Urinary retention  History of Present Illness: 74 year old male previously seen by Dr. Gloriann Loan in [place] for urinary retention.  The patient had a catheter placed in January of this year.  He has had several catheter changes since that time, the last time perhaps in June.  The patient does have significantly limited mobility, is in a dialysis chair for hours 3 times a week, and prior to catheter placement has significant urgency, frequency and urgency incontinence.  Currently, indwelling Foley catheter has improved his lifestyle.  He would rather not have his catheter removed for voiding trial. Past Medical History:  Diagnosis Date  . A-fib [facility])    when initially stating dialysis january 2021 at [facility] per spouse; never heard anything else about it  . Alcoholic cirrhosis ([place])    patient reports completing hep a and b vaccines in 2006  . Anemia    has had 3 units of prbcs 2013  . Anxiety   . Arthritis   . Asymptomatic gallstones    Ultrasound in 2006  . C. difficile colitis   . Cholelithiasis   . Chronic kidney disease    Dialysis M/W/F/Sa  . Depression   . Diabetes ([place])   . Duodenal ulcer with hemorrhage    per patient in 2006 or 2007, records have been requested  . Dyspnea    low oxygen level -  has O2 2 L all day  . 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 GERD (gastroesophageal reflux disease)   . Heart burn   . 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 HOH (hard of hearing)   . Hypertension   . 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 Splenomegaly    Ultrasound in 2006    Past Surgical History:  Procedure Laterality Date  . AGILE CAPSULE N/A 09/20/2019   Procedure: AGILE CAPSULE;  Surgeon: Daneil Dolin, MD;  [facility];  Service: Endoscopy;  Laterality: N/A;  . APPENDECTOMY    . AV FISTULA PLACEMENT Left 08/17/2019   Procedure: ARTERIOVENOUS (AV) FISTULA CREATION VERSES ARTERIOVENOUS GRAFT;  Surgeon: [name], MD;  Location:  [place];  Service: Vascular;  Laterality: Left;  . BASCILIC VEIN TRANSPOSITION Left 09/28/2019   Procedure: LEFT SECOND STAGE BASCILIC VEIN TRANSPOSITION;  Surgeon: [name], MD;  Location: [place];  Service: Vascular;  Laterality: Left;  . BIOPSY  02/03/2018   Procedure: BIOPSY;  Surgeon: Daneil Dolin, MD;  [facility];  Service: Endoscopy;;  bx of gastric polyps  . BIOPSY  09/19/2019   Procedure: BIOPSY;  Surgeon: Danie Binder, MD;  [facility];  Service: Endoscopy;;  . CATARACT EXTRACTION Right   . COLONOSCOPY  03/10/2005   Rectal polyp as described above, removed with snare. Left sided  diverticula. The remainder of the colonic mucosa appeared normal. Inflammed polyp on path.  . COLONOSCOPY  04/1999   Dr. Thea Silversmith polyps removed,  Path showed hyperplastic  . COLONOSCOPY  10/2015   Dr. Britta Mccreedy: diverticulosis, single sessile tubular adenoma 3-74mm in size removed from descending colon.   . COLONOSCOPY WITH ESOPHAGOGASTRODUODENOSCOPY (EGD)  03/31/2012   FBP:ZWCHENI AVMs. Colonic diverticulosis. tubular adenoma colon  . COLONOSCOPY WITH ESOPHAGOGASTRODUODENOSCOPY (EGD)  06/2019   FORSYTH: small esophageal varices without high risk stigmata, single large AVM on lesser curvature in gastric body s/p APC ablation, gastric antral and duodenal bulb polyposis. No significant source to explain transfusion dependent  anemia. Colonoscopy with portal colopathy and diffuse edema, changes of Cdiff colitis on colonoscopy  . ESOPHAGEAL BANDING N/A 02/03/2018   Procedure: ESOPHAGEAL BANDING;  Surgeon: Daneil Dolin, MD;  [facility];  Service: Endoscopy;  Laterality: N/A;  . ESOPHAGOGASTRODUODENOSCOPY  01/15/2005   Three columns grade 1 to 2 esophageal varices, otherwise normal esophageal mucosa.  Esophagus was not manipulated otherwise./Nodularity of the antrum with overlying erosions, nonspecific finding. Path showed rare H.pylori  . ESOPHAGOGASTRODUODENOSCOPY   09/2008   Dr. Gaylene Brooks columns of grade 2-3 esoph varices, only one column was prominent. Portal gastropathy, multiple gastrc polyps at antrum, two were 2cm with black eschar, bulbar polyps, bulbar erosions  . ESOPHAGOGASTRODUODENOSCOPY  11/2004   Dr. Brantley Stage 3 esoph varices  . ESOPHAGOGASTRODUODENOSCOPY  03/31/2012   RMR: 4 columns(3-GR 2, 1-GR1) non-bleeding esophageal varices, portal gastropathy, small HH, early GAVE, multiple gastric polyps   . ESOPHAGOGASTRODUODENOSCOPY (EGD) WITH PROPOFOL N/A 02/03/2018   Dr. Gala Romney: Esophageal varices, 3 columns grade 1-2.  Portal hypertensive gastropathy.  Multiple gastric polyps, biopsy consistent with hyperplastic.  [name] ESOPHAGOGASTRODUODENOSCOPY (EGD) WITH PROPOFOL N/A 09/19/2019   Fields: grade I and II esophageal varcies, mild portal hypertensive gastropathy, moderate gastritis but no H. pylori, single hyperplastic gastric polyp removed, obvious source for melena/transfusion dependent anemia not identified, may be due to friable gastric and duodenal mucosa in the setting of portal hypertension  . GASTRIC VARICES BANDING  03/31/2012   Procedure: GASTRIC VARICES BANDING;  Surgeon: Daneil Dolin, MD;  [facility];  Service: Endoscopy;  Laterality: N/A;  . GIVENS CAPSULE STUDY N/A 09/21/2019   Poor study with a lot of debris obstructing much of the view of the first approximate 3 hours out of 6 hours.  No obvious source of bleeding identified.  Sequela of bleeding and old blood seen in the form of"whisps" of blood, blood flecks mostly toward the end of the study.  [name] POLYPECTOMY  09/19/2019   Procedure: POLYPECTOMY;  Surgeon: Danie Binder, MD;  [facility];  Service: Endoscopy;;  . UMBILICAL HERNIA REPAIR  2017   exploratory laparotomy, abdominal washout    Home Medications:  Allergies as of 12/12/2019      Reactions   Chlorhexidine    Tape Itching, Rash      Medication List       Accurate as of December 12, 2019   5:20 PM. If you have any questions, ask your nurse or doctor.        Alcohol Prep 70 % Pads SMARTSIG:1 Pledget(s) Topical 3 Times Daily   cholestyramine 4 GM/DOSE powder Commonly known as: QUESTRAN Take 4 g by mouth in the morning.   furosemide 80 MG tablet Commonly known as: LASIX Take 80 mg by mouth in the morning.   furosemide 40 MG tablet Commonly known as: LASIX Take 80 mg by mouth daily as needed.   gabapentin 100 MG capsule Commonly known as: NEURONTIN TAKE 1 CAPSULE BY MOUTH AT BEDTIME   HumaLOG KwikPen 100 UNIT/ML KwikPen Generic drug: insulin lispro as needed. Sliding scale   HYDROcodone-acetaminophen 10-325 MG tablet Commonly known as: NORCO Take 1 tablet by mouth every 6 (six) hours as needed for moderate pain. What changed: when to take this   hydrOXYzine 25 MG capsule Commonly known as: VISTARIL Take 25 mg by mouth in the morning and at bedtime.   iron polysaccharides 150 MG capsule Commonly known as: NIFEREX Take 150 mg by mouth at bedtime.   Januvia  50 MG tablet Generic drug: sitaGLIPtin Take 50 mg by mouth daily.   metoprolol succinate 25 MG 24 hr tablet Commonly known as: TOPROL-XL Take 25 mg by mouth 2 (two) times daily.   metoprolol succinate 50 MG 24 hr tablet Commonly known as: TOPROL-XL Take 50 mg by mouth 2 (two) times daily.   milk thistle 175 MG tablet Take 175 mg by mouth 3 (three) times daily.   mirtazapine 15 MG tablet Commonly known as: REMERON Take 15 mg by mouth at bedtime.   multivitamin with minerals Tabs tablet Take 1 tablet by mouth in the morning.   NovoFine 32G X 6 MM Misc Generic drug: Insulin Pen Needle USE DAILY AS NEEDED (MAX 12 SHOTS PER DAY)   omega-3 acid ethyl esters 1 g capsule Commonly known as: LOVAZA Take 2 g by mouth 2 (two) times daily.   omeprazole 40 MG capsule Commonly known as: PRILOSEC Take 40 mg by mouth in the morning.   pravastatin 20 MG tablet Commonly known as: PRAVACHOL Take 20  mg by mouth in the morning.   PROBIOTIC DAILY PO Take 1 tablet by mouth in the morning.   rifaximin 550 MG Tabs tablet Commonly known as: Xifaxan Take 1 tablet (550 mg total) by mouth 2 (two) times daily.   spironolactone 50 MG tablet Commonly known as: ALDACTONE Take 50 mg by mouth in the morning.   tamsulosin 0.4 MG Caps capsule Commonly known as: FLOMAX Take 0.4 mg by mouth every evening.   thiamine 100 MG tablet Take 100 mg by mouth in the morning. Vitamin B-1   tiZANidine 2 MG tablet Commonly known as: ZANAFLEX Take 2 mg by mouth as needed for muscle spasms.   [name] Generic drug: insulin degludec Inject 120 Units into the skin at bedtime. When blood sugars are up   triamcinolone cream 0.1 % Commonly known as: KENALOG Apply 1 application topically 2 (two) times daily as needed (dry skin Itching).   venlafaxine 75 MG tablet Commonly known as: EFFEXOR Take 75 mg by mouth 3 (three) times daily with meals.   vitamin C 1000 MG tablet Take 1,000 mg by mouth 2 (two) times daily.       Allergies:  Allergies  Allergen Reactions  . Chlorhexidine   . Tape Itching and Rash    Family History  Problem Relation Age of Onset  . Colon cancer Father 30       deceased age 52  . Breast cancer Sister        deceased  . Diabetes Sister   . Stroke Mother   . Healthy Son   . Healthy Daughter   . Lung cancer Neg Hx   . Ovarian cancer Neg Hx     Social History:  reports that he quit smoking about 33 years ago. His smoking use included cigarettes. He has a 50.00 pack-year smoking history. He has never used smokeless tobacco. He reports that he does not drink alcohol and does not use drugs.  ROS: A complete review of systems was performed.  All systems are negative except for pertinent findings as noted.  Physical Exam:  Vital signs in last 24 hours: BP 128/63   Pulse 79   Temp 97.9 F (36.6 C)   Ht 5\' 7"  (1.702 m)   Wt 260 lb  (117.9 kg)   BMI 40.72 kg/m  Constitutional:  Alert and oriented, No acute distress Cardiovascular: Regular rate  Respiratory: Normal respiratory effort GI:  Abdomen is soft, nontender, nondistended, no abdominal masses. Obese Neurologic: Grossly intact, no focal deficits Psychiatric: Normal mood and affect  I have reviewed prior pt notes  I have reviewed notes from referring/previous physicians      Impression/Assessment:  Urinary retention, improved with catheter placement  Plan:  I did discuss with the patient voiding trial versus leaving the catheter in, risks and benefits of each  They would like to leave the catheter in for now  He will come in monthly for nurse visit/catheter changes

## 2019-12-13 ENCOUNTER — Encounter (HOSPITAL_COMMUNITY): Payer: Self-pay

## 2019-12-13 ENCOUNTER — Other Ambulatory Visit: Payer: Self-pay

## 2019-12-13 ENCOUNTER — Inpatient Hospital Stay (HOSPITAL_COMMUNITY)
Admission: EM | Admit: 2019-12-13 | Discharge: 2019-12-17 | DRG: 441 | Disposition: A | Discharge status: Home Health | Payer: Medicare Other | Attending: Internal Medicine | Admitting: Internal Medicine

## 2019-12-13 ENCOUNTER — Emergency Department (HOSPITAL_COMMUNITY): Payer: Medicare Other

## 2019-12-13 DIAGNOSIS — R278 Other lack of coordination: Secondary | ICD-10-CM | POA: Diagnosis present

## 2019-12-13 DIAGNOSIS — E871 Hypo-osmolality and hyponatremia: Secondary | ICD-10-CM | POA: Diagnosis not present

## 2019-12-13 DIAGNOSIS — N186 End stage renal disease: Secondary | ICD-10-CM | POA: Diagnosis present

## 2019-12-13 DIAGNOSIS — Z515 Encounter for palliative care: Secondary | ICD-10-CM | POA: Diagnosis not present

## 2019-12-13 DIAGNOSIS — I10 Essential (primary) hypertension: Secondary | ICD-10-CM | POA: Diagnosis not present

## 2019-12-13 DIAGNOSIS — N400 Enlarged prostate without lower urinary tract symptoms: Secondary | ICD-10-CM | POA: Diagnosis present

## 2019-12-13 DIAGNOSIS — D631 Anemia in chronic kidney disease: Secondary | ICD-10-CM | POA: Diagnosis present

## 2019-12-13 DIAGNOSIS — Z87891 Personal history of nicotine dependence: Secondary | ICD-10-CM

## 2019-12-13 DIAGNOSIS — R0689 Other abnormalities of breathing: Secondary | ICD-10-CM | POA: Diagnosis not present

## 2019-12-13 DIAGNOSIS — Z833 Family history of diabetes mellitus: Secondary | ICD-10-CM

## 2019-12-13 DIAGNOSIS — I8510 Secondary esophageal varices without bleeding: Secondary | ICD-10-CM | POA: Diagnosis present

## 2019-12-13 DIAGNOSIS — E11649 Type 2 diabetes mellitus with hypoglycemia without coma: Secondary | ICD-10-CM | POA: Diagnosis not present

## 2019-12-13 DIAGNOSIS — Z7189 Other specified counseling: Secondary | ICD-10-CM

## 2019-12-13 DIAGNOSIS — D61818 Other pancytopenia: Secondary | ICD-10-CM | POA: Diagnosis not present

## 2019-12-13 DIAGNOSIS — Z6841 Body Mass Index (BMI) 40.0 and over, adult: Secondary | ICD-10-CM

## 2019-12-13 DIAGNOSIS — E1122 Type 2 diabetes mellitus with diabetic chronic kidney disease: Secondary | ICD-10-CM | POA: Diagnosis not present

## 2019-12-13 DIAGNOSIS — B9689 Other specified bacterial agents as the cause of diseases classified elsewhere: Secondary | ICD-10-CM | POA: Diagnosis present

## 2019-12-13 DIAGNOSIS — Z9981 Dependence on supplemental oxygen: Secondary | ICD-10-CM

## 2019-12-13 DIAGNOSIS — E1129 Type 2 diabetes mellitus with other diabetic kidney complication: Secondary | ICD-10-CM | POA: Diagnosis not present

## 2019-12-13 DIAGNOSIS — N2581 Secondary hyperparathyroidism of renal origin: Secondary | ICD-10-CM | POA: Diagnosis present

## 2019-12-13 DIAGNOSIS — J9621 Acute and chronic respiratory failure with hypoxia: Secondary | ICD-10-CM | POA: Diagnosis not present

## 2019-12-13 DIAGNOSIS — K7200 Acute and subacute hepatic failure without coma: Secondary | ICD-10-CM

## 2019-12-13 DIAGNOSIS — M62838 Other muscle spasm: Secondary | ICD-10-CM | POA: Diagnosis not present

## 2019-12-13 DIAGNOSIS — N390 Urinary tract infection, site not specified: Secondary | ICD-10-CM | POA: Diagnosis not present

## 2019-12-13 DIAGNOSIS — Z20822 Contact with and (suspected) exposure to covid-19: Secondary | ICD-10-CM | POA: Diagnosis not present

## 2019-12-13 DIAGNOSIS — K7682 Hepatic encephalopathy: Secondary | ICD-10-CM

## 2019-12-13 DIAGNOSIS — Z800 Family history of malignant neoplasm of digestive organs: Secondary | ICD-10-CM

## 2019-12-13 DIAGNOSIS — G9341 Metabolic encephalopathy: Secondary | ICD-10-CM

## 2019-12-13 DIAGNOSIS — Z992 Dependence on renal dialysis: Secondary | ICD-10-CM | POA: Diagnosis not present

## 2019-12-13 DIAGNOSIS — G9340 Encephalopathy, unspecified: Secondary | ICD-10-CM | POA: Diagnosis not present

## 2019-12-13 DIAGNOSIS — E875 Hyperkalemia: Secondary | ICD-10-CM | POA: Diagnosis present

## 2019-12-13 DIAGNOSIS — D62 Acute posthemorrhagic anemia: Secondary | ICD-10-CM | POA: Diagnosis not present

## 2019-12-13 DIAGNOSIS — R6889 Other general symptoms and signs: Secondary | ICD-10-CM | POA: Diagnosis not present

## 2019-12-13 DIAGNOSIS — K7290 Hepatic failure, unspecified without coma: Secondary | ICD-10-CM | POA: Diagnosis not present

## 2019-12-13 DIAGNOSIS — Z91048 Other nonmedicinal substance allergy status: Secondary | ICD-10-CM

## 2019-12-13 DIAGNOSIS — K7460 Unspecified cirrhosis of liver: Secondary | ICD-10-CM | POA: Diagnosis present

## 2019-12-13 DIAGNOSIS — F329 Major depressive disorder, single episode, unspecified: Secondary | ICD-10-CM | POA: Diagnosis present

## 2019-12-13 DIAGNOSIS — K317 Polyp of stomach and duodenum: Secondary | ICD-10-CM | POA: Diagnosis present

## 2019-12-13 DIAGNOSIS — R404 Transient alteration of awareness: Secondary | ICD-10-CM | POA: Diagnosis not present

## 2019-12-13 DIAGNOSIS — Z794 Long term (current) use of insulin: Secondary | ICD-10-CM

## 2019-12-13 DIAGNOSIS — I959 Hypotension, unspecified: Secondary | ICD-10-CM | POA: Diagnosis not present

## 2019-12-13 DIAGNOSIS — Z9115 Patient's noncompliance with renal dialysis: Secondary | ICD-10-CM

## 2019-12-13 DIAGNOSIS — E8889 Other specified metabolic disorders: Secondary | ICD-10-CM | POA: Diagnosis present

## 2019-12-13 DIAGNOSIS — T83518A Infection and inflammatory reaction due to other urinary catheter, initial encounter: Secondary | ICD-10-CM | POA: Diagnosis not present

## 2019-12-13 DIAGNOSIS — K7031 Alcoholic cirrhosis of liver with ascites: Secondary | ICD-10-CM | POA: Diagnosis not present

## 2019-12-13 DIAGNOSIS — K219 Gastro-esophageal reflux disease without esophagitis: Secondary | ICD-10-CM | POA: Diagnosis present

## 2019-12-13 DIAGNOSIS — Z888 Allergy status to other drugs, medicaments and biological substances status: Secondary | ICD-10-CM

## 2019-12-13 DIAGNOSIS — E785 Hyperlipidemia, unspecified: Secondary | ICD-10-CM | POA: Diagnosis present

## 2019-12-13 DIAGNOSIS — H9190 Unspecified hearing loss, unspecified ear: Secondary | ICD-10-CM | POA: Diagnosis present

## 2019-12-13 DIAGNOSIS — R040 Epistaxis: Secondary | ICD-10-CM

## 2019-12-13 DIAGNOSIS — I120 Hypertensive chronic kidney disease with stage 5 chronic kidney disease or end stage renal disease: Secondary | ICD-10-CM | POA: Diagnosis present

## 2019-12-13 DIAGNOSIS — K766 Portal hypertension: Secondary | ICD-10-CM | POA: Diagnosis present

## 2019-12-13 DIAGNOSIS — R161 Splenomegaly, not elsewhere classified: Secondary | ICD-10-CM | POA: Diagnosis present

## 2019-12-13 DIAGNOSIS — E782 Mixed hyperlipidemia: Secondary | ICD-10-CM | POA: Diagnosis present

## 2019-12-13 DIAGNOSIS — F419 Anxiety disorder, unspecified: Secondary | ICD-10-CM | POA: Diagnosis present

## 2019-12-13 DIAGNOSIS — Y846 Urinary catheterization as the cause of abnormal reaction of the patient, or of later complication, without mention of misadventure at the time of the procedure: Secondary | ICD-10-CM | POA: Diagnosis present

## 2019-12-13 DIAGNOSIS — R609 Edema, unspecified: Secondary | ICD-10-CM | POA: Diagnosis not present

## 2019-12-13 DIAGNOSIS — D500 Iron deficiency anemia secondary to blood loss (chronic): Secondary | ICD-10-CM | POA: Diagnosis present

## 2019-12-13 DIAGNOSIS — Z79899 Other long term (current) drug therapy: Secondary | ICD-10-CM

## 2019-12-13 DIAGNOSIS — Z743 Need for continuous supervision: Secondary | ICD-10-CM | POA: Diagnosis not present

## 2019-12-13 DIAGNOSIS — Z1619 Resistance to other specified beta lactam antibiotics: Secondary | ICD-10-CM | POA: Diagnosis not present

## 2019-12-13 DIAGNOSIS — K7030 Alcoholic cirrhosis of liver without ascites: Secondary | ICD-10-CM | POA: Diagnosis not present

## 2019-12-13 DIAGNOSIS — N250 Renal osteodystrophy: Secondary | ICD-10-CM | POA: Diagnosis not present

## 2019-12-13 LAB — COMPREHENSIVE METABOLIC PANEL
ALT: 21 U/L (ref 0–44)
AST: 22 U/L (ref 15–41)
Albumin: 3.1 g/dL — ABNORMAL LOW (ref 3.5–5.0)
Alkaline Phosphatase: 129 U/L — ABNORMAL HIGH (ref 38–126)
Anion gap: 14 (ref 5–15)
BUN: 55 mg/dL — ABNORMAL HIGH (ref 8–23)
CO2: 23 mmol/L (ref 22–32)
Calcium: 9.1 mg/dL (ref 8.9–10.3)
Chloride: 87 mmol/L — ABNORMAL LOW (ref 98–111)
Creatinine, Ser: 4.74 mg/dL — ABNORMAL HIGH (ref 0.61–1.24)
GFR calc Af Amer: 13 mL/min — ABNORMAL LOW (ref >60)
GFR calc non Af Amer: 11 mL/min — ABNORMAL LOW (ref >60)
Glucose, Bld: 96 mg/dL (ref 70–99)
Potassium: 5.7 mmol/L — ABNORMAL HIGH (ref 3.5–5.1)
Sodium: 124 mmol/L — ABNORMAL LOW (ref 135–145)
Total Bilirubin: 0.9 mg/dL (ref 0.3–1.2)
Total Protein: 7.1 g/dL (ref 6.5–8.1)

## 2019-12-13 LAB — URINALYSIS, ROUTINE W REFLEX MICROSCOPIC
Bilirubin Urine: NEGATIVE
Glucose, UA: NEGATIVE mg/dL
Hgb urine dipstick: NEGATIVE
Ketones, ur: NEGATIVE mg/dL
Nitrite: NEGATIVE
Protein, ur: NEGATIVE mg/dL
Specific Gravity, Urine: 1.01 (ref 1.005–1.030)
pH: 5 (ref 5.0–8.0)

## 2019-12-13 LAB — CBC WITH DIFFERENTIAL/PLATELET
Abs Immature Granulocytes: 0.01 10*3/uL (ref 0.00–0.07)
Basophils Absolute: 0 10*3/uL (ref 0.0–0.1)
Basophils Relative: 1 %
Eosinophils Absolute: 0.3 10*3/uL (ref 0.0–0.5)
Eosinophils Relative: 10 %
HCT: 23.2 % — ABNORMAL LOW (ref 39.0–52.0)
Hemoglobin: 7.1 g/dL — ABNORMAL LOW (ref 13.0–17.0)
Immature Granulocytes: 0 %
Lymphocytes Relative: 8 %
Lymphs Abs: 0.3 10*3/uL — ABNORMAL LOW (ref 0.7–4.0)
MCH: 29.2 pg (ref 26.0–34.0)
MCHC: 30.6 g/dL (ref 30.0–36.0)
MCV: 95.5 fL (ref 80.0–100.0)
Monocytes Absolute: 0.3 10*3/uL (ref 0.1–1.0)
Monocytes Relative: 8 %
Neutro Abs: 2.5 10*3/uL (ref 1.7–7.7)
Neutrophils Relative %: 73 %
Platelets: 124 10*3/uL — ABNORMAL LOW (ref 150–400)
RBC: 2.43 MIL/uL — ABNORMAL LOW (ref 4.22–5.81)
RDW: 16.1 % — ABNORMAL HIGH (ref 11.5–15.5)
WBC: 3.4 10*3/uL — ABNORMAL LOW (ref 4.0–10.5)
nRBC: 0 % (ref 0.0–0.2)

## 2019-12-13 LAB — CBC
HCT: 24 % — ABNORMAL LOW (ref 39.0–52.0)
Hemoglobin: 7.3 g/dL — ABNORMAL LOW (ref 13.0–17.0)
MCH: 29.1 pg (ref 26.0–34.0)
MCHC: 30.4 g/dL (ref 30.0–36.0)
MCV: 95.6 fL (ref 80.0–100.0)
Platelets: 132 10*3/uL — ABNORMAL LOW (ref 150–400)
RBC: 2.51 MIL/uL — ABNORMAL LOW (ref 4.22–5.81)
RDW: 16.3 % — ABNORMAL HIGH (ref 11.5–15.5)
WBC: 4 10*3/uL (ref 4.0–10.5)
nRBC: 0 % (ref 0.0–0.2)

## 2019-12-13 LAB — RENAL FUNCTION PANEL
Albumin: 3.1 g/dL — ABNORMAL LOW (ref 3.5–5.0)
Anion gap: 12 (ref 5–15)
BUN: 56 mg/dL — ABNORMAL HIGH (ref 8–23)
CO2: 24 mmol/L (ref 22–32)
Calcium: 9 mg/dL (ref 8.9–10.3)
Chloride: 89 mmol/L — ABNORMAL LOW (ref 98–111)
Creatinine, Ser: 4.91 mg/dL — ABNORMAL HIGH (ref 0.61–1.24)
GFR calc Af Amer: 13 mL/min — ABNORMAL LOW (ref >60)
GFR calc non Af Amer: 11 mL/min — ABNORMAL LOW (ref >60)
Glucose, Bld: 74 mg/dL (ref 70–99)
Phosphorus: 7.2 mg/dL — ABNORMAL HIGH (ref 2.5–4.6)
Potassium: 5.4 mmol/L — ABNORMAL HIGH (ref 3.5–5.1)
Sodium: 125 mmol/L — ABNORMAL LOW (ref 135–145)

## 2019-12-13 LAB — GLUCOSE, CAPILLARY
Glucose-Capillary: 54 mg/dL — ABNORMAL LOW (ref 70–99)
Glucose-Capillary: 64 mg/dL — ABNORMAL LOW (ref 70–99)
Glucose-Capillary: 133 mg/dL — ABNORMAL HIGH (ref 70–99)
Glucose-Capillary: 125 mg/dL — ABNORMAL HIGH (ref 70–99)

## 2019-12-13 LAB — MRSA PCR SCREENING: MRSA by PCR: NEGATIVE

## 2019-12-13 LAB — LIPASE, BLOOD: Lipase: 24 U/L (ref 11–51)

## 2019-12-13 LAB — SARS CORONAVIRUS 2 BY RT PCR (HOSPITAL ORDER, PERFORMED IN ~~LOC~~ HOSPITAL LAB): SARS Coronavirus 2: NEGATIVE

## 2019-12-13 LAB — AMMONIA: Ammonia: 46 umol/L — ABNORMAL HIGH (ref 9–35)

## 2019-12-13 MED ORDER — LACTULOSE 10 GM/15ML PO SOLN
30.0000 g | Freq: Two times a day (BID) | ORAL | Status: DC
  Administered 2019-12-14 – 2019-12-17 (×8): 30 g via ORAL
  Filled 2019-12-13 (×8): qty 60

## 2019-12-13 MED ORDER — FUROSEMIDE 80 MG PO TABS
80.0000 mg | ORAL_TABLET | Freq: Every morning | ORAL | Status: DC
  Administered 2019-12-14 – 2019-12-17 (×4): 80 mg via ORAL
  Filled 2019-12-13 (×4): qty 1

## 2019-12-13 MED ORDER — ONDANSETRON HCL 4 MG PO TABS: 4.0000 mg | ORAL_TABLET | Freq: Four times a day (QID) | ORAL | Status: DC | PRN

## 2019-12-13 MED ORDER — ONDANSETRON HCL 4 MG/2ML IJ SOLN
4.0000 mg | Freq: Four times a day (QID) | INTRAMUSCULAR | Status: DC | PRN
  Administered 2019-12-13 – 2019-12-14 (×2): 4 mg via INTRAVENOUS
  Filled 2019-12-13 (×2): qty 2

## 2019-12-13 MED ORDER — ASCORBIC ACID 500 MG PO TABS
1000.0000 mg | ORAL_TABLET | Freq: Two times a day (BID) | ORAL | Status: DC
  Administered 2019-12-14 – 2019-12-17 (×8): 1000 mg via ORAL
  Filled 2019-12-13 (×8): qty 2

## 2019-12-13 MED ORDER — CHLORHEXIDINE GLUCONATE CLOTH 2 % EX PADS: 6.0000 | MEDICATED_PAD | Freq: Every day | CUTANEOUS | Status: DC

## 2019-12-13 MED ORDER — SPIRONOLACTONE 25 MG PO TABS
50.0000 mg | ORAL_TABLET | Freq: Every morning | ORAL | Status: DC
  Administered 2019-12-14: 50 mg via ORAL
  Filled 2019-12-13: qty 2

## 2019-12-13 MED ORDER — LIDOCAINE HCL (PF) 1 % IJ SOLN: 5.0000 mL | INTRAMUSCULAR | Status: DC | PRN

## 2019-12-13 MED ORDER — SODIUM CHLORIDE 0.9 % IV SOLN
125.0000 mg | Freq: Once | INTRAVENOUS | Status: AC
  Administered 2019-12-13: 125 mg via INTRAVENOUS
  Filled 2019-12-13: qty 10

## 2019-12-13 MED ORDER — VITAMIN B-12 1000 MCG PO TABS
1000.0000 ug | ORAL_TABLET | Freq: Every day | ORAL | Status: DC
  Administered 2019-12-13 – 2019-12-17 (×5): 1000 ug via ORAL
  Filled 2019-12-13 (×5): qty 1

## 2019-12-13 MED ORDER — HEPARIN SODIUM (PORCINE) 1000 UNIT/ML DIALYSIS
1000.0000 [IU] | INTRAMUSCULAR | Status: DC | PRN
  Filled 2019-12-13 (×2): qty 1

## 2019-12-13 MED ORDER — OMEGA-3-ACID ETHYL ESTERS 1 G PO CAPS
2.0000 g | ORAL_CAPSULE | Freq: Two times a day (BID) | ORAL | Status: DC
  Administered 2019-12-14 – 2019-12-17 (×8): 2 g via ORAL
  Filled 2019-12-13 (×8): qty 2

## 2019-12-13 MED ORDER — PRAVASTATIN SODIUM 10 MG PO TABS
20.0000 mg | ORAL_TABLET | Freq: Every morning | ORAL | Status: DC
  Administered 2019-12-14 – 2019-12-17 (×3): 20 mg via ORAL
  Filled 2019-12-13 (×3): qty 2

## 2019-12-13 MED ORDER — INSULIN ASPART 100 UNIT/ML ~~LOC~~ SOLN: 0.0000 [IU] | Freq: Three times a day (TID) | SUBCUTANEOUS | Status: DC

## 2019-12-13 MED ORDER — VENLAFAXINE HCL 37.5 MG PO TABS
75.0000 mg | ORAL_TABLET | Freq: Three times a day (TID) | ORAL | Status: DC
  Administered 2019-12-15 – 2019-12-17 (×7): 75 mg via ORAL
  Filled 2019-12-13 (×8): qty 2

## 2019-12-13 MED ORDER — DARBEPOETIN ALFA 60 MCG/0.3ML IJ SOSY
60.0000 ug | PREFILLED_SYRINGE | INTRAMUSCULAR | Status: DC
  Administered 2019-12-13: 60 ug via INTRAVENOUS
  Filled 2019-12-13: qty 0.3

## 2019-12-13 MED ORDER — ACETAMINOPHEN 325 MG PO TABS: 650.0000 mg | ORAL_TABLET | Freq: Four times a day (QID) | ORAL | Status: DC | PRN

## 2019-12-13 MED ORDER — POLYSACCHARIDE IRON COMPLEX 150 MG PO CAPS
150.0000 mg | ORAL_CAPSULE | Freq: Every day | ORAL | Status: DC
  Administered 2019-12-14 – 2019-12-16 (×4): 150 mg via ORAL
  Filled 2019-12-13 (×4): qty 1

## 2019-12-13 MED ORDER — SODIUM CHLORIDE 0.9 % IV SOLN: 100.0000 mL | INTRAVENOUS | Status: DC | PRN

## 2019-12-13 MED ORDER — ADULT MULTIVITAMIN W/MINERALS CH
1.0000 | ORAL_TABLET | Freq: Every morning | ORAL | Status: DC
  Administered 2019-12-14 – 2019-12-17 (×3): 1 via ORAL
  Filled 2019-12-13 (×3): qty 1

## 2019-12-13 MED ORDER — DEXTROSE 50 % IV SOLN
INTRAVENOUS | Status: AC
  Filled 2019-12-13: qty 50

## 2019-12-13 MED ORDER — METOPROLOL SUCCINATE ER 25 MG PO TB24
25.0000 mg | ORAL_TABLET | ORAL | Status: DC
  Administered 2019-12-13 – 2019-12-17 (×3): 25 mg via ORAL
  Filled 2019-12-13 (×5): qty 1

## 2019-12-13 MED ORDER — RIFAXIMIN 550 MG PO TABS
550.0000 mg | ORAL_TABLET | Freq: Two times a day (BID) | ORAL | Status: DC
  Administered 2019-12-14 – 2019-12-17 (×8): 550 mg via ORAL
  Filled 2019-12-13 (×8): qty 1

## 2019-12-13 MED ORDER — DEXTROSE 50 % IV SOLN
INTRAVENOUS | Status: AC
  Administered 2019-12-13: 100 mL
  Filled 2019-12-13: qty 50

## 2019-12-13 MED ORDER — ALTEPLASE 2 MG IJ SOLR: 2.0000 mg | Freq: Once | INTRAMUSCULAR | Status: DC | PRN

## 2019-12-13 MED ORDER — INSULIN ASPART 100 UNIT/ML ~~LOC~~ SOLN: 0.0000 [IU] | Freq: Every day | SUBCUTANEOUS | Status: DC

## 2019-12-13 MED ORDER — THIAMINE HCL 100 MG PO TABS
100.0000 mg | ORAL_TABLET | Freq: Every morning | ORAL | Status: DC
  Administered 2019-12-14 – 2019-12-17 (×3): 100 mg via ORAL
  Filled 2019-12-13 (×4): qty 1

## 2019-12-13 MED ORDER — LIDOCAINE-PRILOCAINE 2.5-2.5 % EX CREA: 1.0000 "application " | TOPICAL_CREAM | CUTANEOUS | Status: DC | PRN

## 2019-12-13 MED ORDER — ACETAMINOPHEN 650 MG RE SUPP: 650.0000 mg | Freq: Four times a day (QID) | RECTAL | Status: DC | PRN

## 2019-12-13 MED ORDER — PENTAFLUOROPROP-TETRAFLUOROETH EX AERO: 1.0000 "application " | INHALATION_SPRAY | CUTANEOUS | Status: DC | PRN

## 2019-12-13 MED ORDER — SODIUM CHLORIDE 0.9 % IV SOLN
1.0000 g | INTRAVENOUS | Status: DC
  Administered 2019-12-13 – 2019-12-15 (×3): 1 g via INTRAVENOUS
  Filled 2019-12-13 (×3): qty 10

## 2019-12-13 NOTE — Consult Note (Signed)
[name] Renal Consultation Note    Indication for Consultation:  Management of ESRD/hemodialysis; anemia, hypertension/volume and secondary hyperparathyroidism  HPI: Jeffrey [name] is a 74 y.o. 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  He did not go to dialysis this week for unclear reasons.  Apparently he was seen by urology yesterday and was alert, awake, and oriented at that visit but has not been feeling well since this morning.  He denies any N/V/D/fevers/cough/sob.  In the ED he was noted to be lethargic but arouseable, but did have asterixis.  CT scan of head was unremarkable.  Sodium of 124, K 5.7, BUN 55, WBC 3.4, Hgb 7.1, plt 124, NH3 46.  He is being admitted for further evaluation of his acute metabolic encephalopathy.  We were consulted to help provide HD during his hospitalization.  Past Medical History:  Diagnosis Date  . A-fib [facility])    when initially stating dialysis january 2021 at [facility] per spouse; never heard anything else about it  . Alcoholic cirrhosis ([place])    patient reports completing hep a and b vaccines in 2006  . Anemia    has had 3 units of prbcs 2013  . Anxiety   . Arthritis   . Asymptomatic gallstones    Ultrasound in 2006  . C. difficile colitis   . Cholelithiasis   . Chronic kidney disease    Dialysis M/W/F/Sa  . Depression   . Diabetes ([place])   . Duodenal ulcer with hemorrhage    per patient in 2006 or 2007, records have been requested  . Dyspnea    low oxygen level -  has O2 2 L all day  . 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 GERD (gastroesophageal reflux disease)   . Heart burn   . 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 HOH (hard of hearing)   . Hypertension   . Liver cirrhosis ([place])   .  Splenomegaly    Ultrasound in 2006   Past Surgical History:  Procedure Laterality Date  . AGILE CAPSULE N/A 09/20/2019   Procedure: AGILE CAPSULE;  Surgeon: Daneil Dolin, MD;  [facility];  Service: Endoscopy;  Laterality: N/A;  . APPENDECTOMY    . AV FISTULA PLACEMENT Left 08/17/2019   Procedure: ARTERIOVENOUS (AV) FISTULA CREATION VERSES ARTERIOVENOUS GRAFT;  Surgeon: [name], MD;  Location: [place];  Service: Vascular;  Laterality: Left;  . BASCILIC VEIN TRANSPOSITION Left 09/28/2019   Procedure: LEFT SECOND STAGE BASCILIC VEIN TRANSPOSITION;  Surgeon: [name], MD;  Location: [place];  Service: Vascular;  Laterality: Left;  . BIOPSY  02/03/2018   Procedure: BIOPSY;  Surgeon: Daneil Dolin, MD;  [facility];  Service: Endoscopy;;  bx of gastric polyps  . BIOPSY  09/19/2019   Procedure: BIOPSY;  Surgeon: Danie Binder, MD;  [facility];  Service: Endoscopy;;  . CATARACT EXTRACTION Right   . COLONOSCOPY  03/10/2005   Rectal polyp as described above, removed with snare. Left sided  diverticula. The remainder of the colonic mucosa appeared normal. Inflammed polyp on path.  . COLONOSCOPY  04/1999   Dr. Thea Silversmith polyps removed,  Path showed hyperplastic  . COLONOSCOPY  10/2015   Dr. Britta Mccreedy: diverticulosis, single sessile tubular adenoma 3-13mm in size removed from descending colon.   . COLONOSCOPY WITH ESOPHAGOGASTRODUODENOSCOPY (EGD)  03/31/2012   [id number] AVMs. Colonic diverticulosis. tubular adenoma colon  . COLONOSCOPY WITH ESOPHAGOGASTRODUODENOSCOPY (EGD)  06/2019   FORSYTH: small esophageal varices without high risk stigmata, single large AVM on lesser curvature in gastric body s/p APC ablation, gastric antral and duodenal bulb polyposis. No significant source to explain transfusion dependent anemia. Colonoscopy with portal colopathy and diffuse edema, changes of Cdiff colitis on colonoscopy  . ESOPHAGEAL BANDING N/A 02/03/2018    Procedure: ESOPHAGEAL BANDING;  Surgeon: Daneil Dolin, MD;  [facility];  Service: Endoscopy;  Laterality: N/A;  . ESOPHAGOGASTRODUODENOSCOPY  01/15/2005   Three columns grade 1 to 2 esophageal varices, otherwise normal esophageal mucosa.  Esophagus was not manipulated otherwise./Nodularity of the antrum with overlying erosions, nonspecific finding. Path showed rare H.pylori  . ESOPHAGOGASTRODUODENOSCOPY  09/2008   Dr. Gaylene Brooks columns of grade 2-3 esoph varices, only one column was prominent. Portal gastropathy, multiple gastrc polyps at antrum, two were 2cm with black eschar, bulbar polyps, bulbar erosions  . ESOPHAGOGASTRODUODENOSCOPY  11/2004   Dr. Brantley Stage 3 esoph varices  . ESOPHAGOGASTRODUODENOSCOPY  03/31/2012   RMR: 4 columns(3-GR 2, 1-GR1) non-bleeding esophageal varices, portal gastropathy, small HH, early GAVE, multiple gastric polyps   . ESOPHAGOGASTRODUODENOSCOPY (EGD) WITH PROPOFOL N/A 02/03/2018   Dr. Gala Romney: Esophageal varices, 3 columns grade 1-2.  Portal hypertensive gastropathy.  Multiple gastric polyps, biopsy consistent with hyperplastic.  [name] ESOPHAGOGASTRODUODENOSCOPY (EGD) WITH PROPOFOL N/A 09/19/2019   Fields: grade I and II esophageal varcies, mild portal hypertensive gastropathy, moderate gastritis but no H. pylori, single hyperplastic gastric polyp removed, obvious source for melena/transfusion dependent anemia not identified, may be due to friable gastric and duodenal mucosa in the setting of portal hypertension  . GASTRIC VARICES BANDING  03/31/2012   Procedure: GASTRIC VARICES BANDING;  Surgeon: Daneil Dolin, MD;  [facility];  Service: Endoscopy;  Laterality: N/A;  . GIVENS CAPSULE STUDY N/A 09/21/2019   Poor study with a lot of debris obstructing much of the view of the first approximate 3 hours out of 6 hours.  No obvious source of bleeding identified.  Sequela of bleeding and old blood seen in the form of"whisps" of blood, blood  flecks mostly toward the end of the study.  [name] POLYPECTOMY  09/19/2019   Procedure: POLYPECTOMY;  Surgeon: Danie Binder, MD;  [facility];  Service: Endoscopy;;  . UMBILICAL HERNIA REPAIR  2017   exploratory laparotomy, abdominal washout   Family History:   Family History  Problem Relation Age of Onset  . Colon cancer Father 53       deceased age 50  . Breast cancer Sister        deceased  . Diabetes Sister   . Stroke Mother   . Healthy Son   . Healthy Daughter   . Lung cancer Neg Hx   . Ovarian cancer Neg Hx    Social History:  reports that he quit smoking about 33 years ago. His smoking use included cigarettes. He has a 50.00 pack-year smoking history. He has never used smokeless tobacco. He reports that he does not drink alcohol and does not use drugs. Allergies  Allergen Reactions  . Chlorhexidine   . Tape Itching and Rash   Prior to Admission medications   Medication Sig Start Date End Date Taking? Authorizing Provider  Ascorbic Acid (  VITAMIN C) 1000 MG tablet Take 1,000 mg by mouth 2 (two) times daily.   Yes [provider]  cholestyramine Lucrezia Starch) 4 GM/DOSE powder Take 4 g by mouth in the morning.  08/03/19  Yes [provider]  furosemide (LASIX) 80 MG tablet Take 80 mg by mouth in the morning.  08/07/19  Yes [provider]  gabapentin (NEURONTIN) 100 MG capsule TAKE 1 CAPSULE BY MOUTH AT BEDTIME 10/05/19  Yes Waynetta Sandy, MD  HUMALOG KWIKPEN 100 UNIT/ML KwikPen as needed. Sliding scale 11/05/19  Yes [provider]  HYDROcodone-acetaminophen (NORCO) 10-325 MG tablet Take 1 tablet by mouth every 6 (six) hours as needed for moderate pain. Patient taking differently: Take 1 tablet by mouth as needed for moderate pain.  09/28/19  Yes Rhyne, Hulen Shouts, PA-C  hydrOXYzine (VISTARIL) 25 MG capsule Take 25 mg by mouth in the morning and at bedtime.  08/25/19  Yes [provider]  insulin degludec (TRESIBA  FLEXTOUCH) 200 UNIT/ML FlexTouch Pen Inject 120 Units into the skin at bedtime. When blood sugars are up   Yes [provider]  iron polysaccharides (NIFEREX) 150 MG capsule Take 150 mg by mouth at bedtime.    Yes [provider]  metoprolol succinate (TOPROL-XL) 25 MG 24 hr tablet Take 25 mg by mouth daily. Only on Monday ,Wednesday and Friday 11/08/19  Yes [provider]  milk thistle 175 MG tablet Take 175 mg by mouth 3 (three) times daily.    Yes [provider]  mirtazapine (REMERON) 15 MG tablet Take 15 mg by mouth at bedtime.   Yes [provider]  Multiple Vitamin (MULTIVITAMIN WITH MINERALS) TABS tablet Take 1 tablet by mouth in the morning.    Yes [provider]  omega-3 acid ethyl esters (LOVAZA) 1 g capsule Take 2 g by mouth 2 (two) times daily.  04/07/19  Yes [provider]  omeprazole (PRILOSEC) 40 MG capsule Take 40 mg by mouth in the morning.    Yes [provider]  pravastatin (PRAVACHOL) 20 MG tablet Take 20 mg by mouth in the morning.    Yes [provider]  Probiotic Product (PROBIOTIC DAILY PO) Take 1 tablet by mouth in the morning.    Yes [provider]  rifaximin (XIFAXAN) 550 MG TABS tablet Take 1 tablet (550 mg total) by mouth 2 (two) times daily. 01/13/19  Yes Mahala Menghini, PA-C  spironolactone (ALDACTONE) 50 MG tablet Take 50 mg by mouth in the morning.    Yes [provider]  tamsulosin (FLOMAX) 0.4 MG CAPS capsule Take 0.4 mg by mouth every evening.   Yes [provider]  thiamine 100 MG tablet Take 100 mg by mouth in the morning. Vitamin B-1   Yes [provider]  tiZANidine (ZANAFLEX) 2 MG tablet Take 2 mg by mouth as needed for muscle spasms.    Yes [provider]  triamcinolone cream (KENALOG) 0.1 % Apply 1 application topically 2 (two) times daily as needed (dry skin Itching).  12/30/17  Yes [provider]  venlafaxine (EFFEXOR) 75  MG tablet Take 75 mg by mouth 3 (three) times daily with meals.    Yes [provider]  vitamin B-12 (CYANOCOBALAMIN) 1000 MCG tablet Take 1,000 mcg by mouth daily.   Yes [provider]   No current facility-administered medications for this encounter.   Current Outpatient Medications  Medication Sig Dispense Refill  . Ascorbic Acid (VITAMIN C) 1000 MG  tablet Take 1,000 mg by mouth 2 (two) times daily.    . cholestyramine (QUESTRAN) 4 GM/DOSE powder Take 4 g by mouth in the morning.     . furosemide (LASIX) 80 MG tablet Take 80 mg by mouth in the morning.     . gabapentin (NEURONTIN) 100 MG capsule TAKE 1 CAPSULE BY MOUTH AT BEDTIME 90 capsule 0  . HUMALOG KWIKPEN 100 UNIT/ML KwikPen as needed. Sliding scale    . HYDROcodone-acetaminophen (NORCO) 10-325 MG tablet Take 1 tablet by mouth every 6 (six) hours as needed for moderate pain. (Patient taking differently: Take 1 tablet by mouth as needed for moderate pain. ) 20 tablet 0  . hydrOXYzine (VISTARIL) 25 MG capsule Take 25 mg by mouth in the morning and at bedtime.     . insulin degludec (TRESIBA FLEXTOUCH) 200 UNIT/ML FlexTouch Pen Inject 120 Units into the skin at bedtime. When blood sugars are up    . iron polysaccharides (NIFEREX) 150 MG capsule Take 150 mg by mouth at bedtime.     . metoprolol succinate (TOPROL-XL) 25 MG 24 hr tablet Take 25 mg by mouth daily. Only on Monday ,Wednesday and Friday    . milk thistle 175 MG tablet Take 175 mg by mouth 3 (three) times daily.     . mirtazapine (REMERON) 15 MG tablet Take 15 mg by mouth at bedtime.    . Multiple Vitamin (MULTIVITAMIN WITH MINERALS) TABS tablet Take 1 tablet by mouth in the morning.     [name] omega-3 acid ethyl esters (LOVAZA) 1 g capsule Take 2 g by mouth 2 (two) times daily.     [name] omeprazole (PRILOSEC) 40 MG capsule Take 40 mg by mouth in the morning.     . pravastatin (PRAVACHOL) 20 MG tablet Take 20 mg by mouth in the morning.     . Probiotic Product  (PROBIOTIC DAILY PO) Take 1 tablet by mouth in the morning.     . rifaximin (XIFAXAN) 550 MG TABS tablet Take 1 tablet (550 mg total) by mouth 2 (two) times daily. 60 tablet 11  . spironolactone (ALDACTONE) 50 MG tablet Take 50 mg by mouth in the morning.     . tamsulosin (FLOMAX) 0.4 MG CAPS capsule Take 0.4 mg by mouth every evening.    . thiamine 100 MG tablet Take 100 mg by mouth in the morning. Vitamin B-1    . tiZANidine (ZANAFLEX) 2 MG tablet Take 2 mg by mouth as needed for muscle spasms.     [name] triamcinolone cream (KENALOG) 0.1 % Apply 1 application topically 2 (two) times daily as needed (dry skin Itching).   3  . venlafaxine (EFFEXOR) 75 MG tablet Take 75 mg by mouth 3 (three) times daily with meals.     . vitamin B-12 (CYANOCOBALAMIN) 1000 MCG tablet Take 1,000 mcg by mouth daily.     Labs: Basic Metabolic Panel: Recent Labs  Lab 12/13/19 1048  NA 124*  K 5.7*  CL 87*  CO2 23  GLUCOSE 96  BUN 55*  CREATININE 4.74*  CALCIUM 9.1   Liver Function Tests: Recent Labs  Lab 12/13/19 1048  AST 22  ALT 21  ALKPHOS 129*  BILITOT 0.9  PROT 7.1  ALBUMIN 3.1*   Recent Labs  Lab 12/13/19 1048  LIPASE 24   Recent Labs  Lab 12/13/19 1048  AMMONIA 46*   CBC: Recent Labs  Lab 12/13/19 1048  WBC 3.4*  NEUTROABS 2.5  HGB 7.1*  HCT 23.2*  MCV 95.5  PLT 124*   Cardiac Enzymes: No results for input(s): CKTOTAL, CKMB, CKMBINDEX, TROPONINI in the last 168 hours. CBG: No results for input(s): GLUCAP in the last 168 hours. Iron Studies: No results for input(s): IRON, TIBC, TRANSFERRIN, FERRITIN in the last 72 hours. Studies/Results: No results found.  ROS: Review of systems not obtained due to patient factors. Physical Exam: Vitals:   12/13/19 1005 12/13/19 1013 12/13/19 1014  BP: (!) 163/72    Pulse: 88    Resp: 18    Temp: 97.6 F (36.4 C)    TempSrc: Oral    SpO2: 95% 98%   Weight:   117.9 kg  Height:   5\' 7"  (1.702 m)      Weight change:  No intake  or output data in the 24 hours ending 12/13/19 1308 BP (!) 163/72 (BP Location: Right Arm)   Pulse 88   Temp 97.6 F (36.4 C) (Oral)   Resp 18   Ht 5\' 7"  (1.702 m)   Wt 117.9 kg   SpO2 98%   BMI 40.72 kg/m  General appearance: delirious, fatigued, moderately obese and slowed mentation Head: Normocephalic, without obvious abnormality, atraumatic Resp: rhonchi bilaterally Cardio: regular rate and rhythm, S1, S2 normal, no murmur, click, rub or gallop GI: obese, distended, +BS, NT Extremities: edema 1+ bilateral lower extremities and LUE AVF +T/B with some bruising over fistula Dialysis Access:  Dialysis Orders: Center: [facility]  on MWF . EDW 109 kg HD Bath 2K/2.5Ca  Time 4 hours Heparin none. Access LUE AVF (new using 17 gauge needles) BFR 350 DFR 600    Epogen 10,000   Units IV/HD  Venofer  100 mg IV q tx for 10 doses    Assessment/Plan: 1.  Acute metabolic encephalopathy- unclear etiology, however likely combination of hepatic encephalopathy and ESRD without HD for 5 days.  Need to r/o UTI given chronic indwelling foley catheter.  Denies any Etoh use. 2.  ESRD -  Plan for HD today and follow 3.  Hypertension/volume  - UF as tolerated.  He is 10 kg above his EDW and may need daily HD 4.  Anemia  - follow H/H and resume ESA and IV iron 5.  Metabolic bone disease -  Npo for now 6.  Nutrition -  Renal diet, carb modified when able to take po 7. Cirrhosis- now with AMS and elevated NH3 8. Chronic GIB- follow H/H and transfuse prn. 9. Vascular access- will use his [facility] today given his lack of HD and elevated K and BUN for better clearance.   Donetta Potts, MD [place] Pager [phone] 12/13/2019, 1:08 PM

## 2019-12-13 NOTE — ED Provider Notes (Signed)
[facility] EMERGENCY DEPARTMENT Provider Note   CSN: [id number] Arrival date & time: 12/13/19  3790     History Chief Complaint  Patient presents with  . Weakness    Jeffrey [name] is a 74 y.o. male with a history of alcoholic cirrhosis, last EtOH intake over 5 years ago, end-stage renal disease on dialysis Monday Wednesday Friday who missed Mondays and today's dialysis sessions, history of diabetes, hypertension and chronic urinary retention who wears a Foley catheter, changed yesterday by urology presenting for generalized weakness, fatigue and increased muscle spasm, predominantly in his right arm which wife first noticed over the weekend.  He has had profound worsening weakness at home, wife reporting for the past 24 hours he has been unable to ambulate, has been using a wheelchair to get around in his home.  There have been no known fevers, no nausea or vomiting, no diarrhea.  He has had reduced p.o. intake and reports upper abdominal pain.  Patient is a poor history giver secondary to weakness.  He is also confused, awake and oriented x 2.  Disoriented to time,  majority of history obtained by wife per telephone.  She does endorse his symptoms remind her the last time he had problems with his liver.     ,The history is provided by the patient and the spouse.       Past Medical History:  Diagnosis Date  . A-fib [facility])    when initially stating dialysis january 2021 at [facility] per spouse; never heard anything else about it  . Alcoholic cirrhosis ([place])    patient reports completing hep a and b vaccines in 2006  . Anemia    has had 3 units of prbcs 2013  . Anxiety   . Arthritis   . Asymptomatic gallstones    Ultrasound in 2006  . C. difficile colitis   . Cholelithiasis   . Chronic kidney disease    Dialysis M/W/F/Sa  . Depression   . Diabetes ([place])   . Duodenal ulcer with hemorrhage    per patient in 2006 or 2007, records have been requested  . Dyspnea    low  oxygen level -  has O2 2 L all day  . 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 GERD (gastroesophageal reflux disease)   . Heart burn   . 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 HOH (hard of hearing)   . Hypertension   . Liver cirrhosis ([place])   . Splenomegaly    Ultrasound in 2006    Patient Active Problem List   Diagnosis Date Noted  . Anemia 11/16/2019  . Acute blood loss anemia 09/20/2019  . Gastrointestinal hemorrhage   . Acute on chronic anemia 09/18/2019  . Generalized weakness 09/18/2019  . DM2 (diabetes mellitus, type 2) ([place]) 09/18/2019  . 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[place]) 09/18/2019  . Enteritis due to Clostridium difficile 08/31/2019  . Edema 04/11/2019  . Alcoholic cirrhosis ([place]) 24/01/7352  . Type 2 diabetes mellitus with stage 3 chronic kidney disease, with long-term current use of insulin ([place]) 10/20/2017  . Essential hypertension, benign 10/20/2017  . Morbid obesity ([place]) 10/20/2017  . Mixed hyperlipidemia 10/20/2017  . Ascites   . SBP (spontaneous bacterial peritonitis) ([place]) 01/12/2016  . Shigella dysenteriae 01/11/2016  . Diarrhea 01/10/2016  . Cirrhosis ([place]) 09/05/2012  . Alcoholic cirrhosis of liver with ascites ([place]) 03/09/2012  . Heme positive stool 03/09/2012  . Anemia due to  chronic blood loss 03/09/2012  . Pancytopenia ([place]) 03/09/2012  . FH: colon cancer 03/09/2012  . Esophageal varices in alcoholic cirrhosis ([place]) 16/03/9603    Past Surgical History:  Procedure Laterality Date  . AGILE CAPSULE N/A 09/20/2019   Procedure: AGILE CAPSULE;  Surgeon: Daneil Dolin, MD;  [facility];  Service: Endoscopy;  Laterality: N/A;  . APPENDECTOMY    . AV FISTULA PLACEMENT Left 08/17/2019   Procedure: ARTERIOVENOUS (AV) FISTULA CREATION VERSES ARTERIOVENOUS GRAFT;  Surgeon: [name], MD;  Location: [place];  Service: Vascular;  Laterality: Left;  . BASCILIC VEIN TRANSPOSITION Left 09/28/2019   Procedure: LEFT SECOND STAGE BASCILIC  VEIN TRANSPOSITION;  Surgeon: [name], MD;  Location: [place];  Service: Vascular;  Laterality: Left;  . BIOPSY  02/03/2018   Procedure: BIOPSY;  Surgeon: Daneil Dolin, MD;  [facility];  Service: Endoscopy;;  bx of gastric polyps  . BIOPSY  09/19/2019   Procedure: BIOPSY;  Surgeon: Danie Binder, MD;  [facility];  Service: Endoscopy;;  . CATARACT EXTRACTION Right   . COLONOSCOPY  03/10/2005   Rectal polyp as described above, removed with snare. Left sided  diverticula. The remainder of the colonic mucosa appeared normal. Inflammed polyp on path.  . COLONOSCOPY  04/1999   Dr. Thea Silversmith polyps removed,  Path showed hyperplastic  . COLONOSCOPY  10/2015   Dr. Britta Mccreedy: diverticulosis, single sessile tubular adenoma 3-32mm in size removed from descending colon.   . COLONOSCOPY WITH ESOPHAGOGASTRODUODENOSCOPY (EGD)  03/31/2012   VWU:[id number] AVMs. Colonic diverticulosis. tubular adenoma colon  . COLONOSCOPY WITH ESOPHAGOGASTRODUODENOSCOPY (EGD)  06/2019   FORSYTH: small esophageal varices without high risk stigmata, single large AVM on lesser curvature in gastric body s/p APC ablation, gastric antral and duodenal bulb polyposis. No significant source to explain transfusion dependent anemia. Colonoscopy with portal colopathy and diffuse edema, changes of Cdiff colitis on colonoscopy  . ESOPHAGEAL BANDING N/A 02/03/2018   Procedure: ESOPHAGEAL BANDING;  Surgeon: Daneil Dolin, MD;  [facility];  Service: Endoscopy;  Laterality: N/A;  . ESOPHAGOGASTRODUODENOSCOPY  01/15/2005   Three columns grade 1 to 2 esophageal varices, otherwise normal esophageal mucosa.  Esophagus was not manipulated otherwise./Nodularity of the antrum with overlying erosions, nonspecific finding. Path showed rare H.pylori  . ESOPHAGOGASTRODUODENOSCOPY  09/2008   Dr. Gaylene Brooks columns of grade 2-3 esoph varices, only one column was prominent. Portal gastropathy, multiple gastrc polyps  at antrum, two were 2cm with black eschar, bulbar polyps, bulbar erosions  . ESOPHAGOGASTRODUODENOSCOPY  11/2004   Dr. Brantley Stage 3 esoph varices  . ESOPHAGOGASTRODUODENOSCOPY  03/31/2012   RMR: 4 columns(3-GR 2, 1-GR1) non-bleeding esophageal varices, portal gastropathy, small HH, early GAVE, multiple gastric polyps   . ESOPHAGOGASTRODUODENOSCOPY (EGD) WITH PROPOFOL N/A 02/03/2018   Dr. Gala Romney: Esophageal varices, 3 columns grade 1-2.  Portal hypertensive gastropathy.  Multiple gastric polyps, biopsy consistent with hyperplastic.  [name] ESOPHAGOGASTRODUODENOSCOPY (EGD) WITH PROPOFOL N/A 09/19/2019   Fields: grade I and II esophageal varcies, mild portal hypertensive gastropathy, moderate gastritis but no H. pylori, single hyperplastic gastric polyp removed, obvious source for melena/transfusion dependent anemia not identified, may be due to friable gastric and duodenal mucosa in the setting of portal hypertension  . GASTRIC VARICES BANDING  03/31/2012   Procedure: GASTRIC VARICES BANDING;  Surgeon: Daneil Dolin, MD;  [facility];  Service: Endoscopy;  Laterality: N/A;  . GIVENS CAPSULE STUDY N/A 09/21/2019   Poor study with a  lot of debris obstructing much of the view of the first approximate 3 hours out of 6 hours.  No obvious source of bleeding identified.  Sequela of bleeding and old blood seen in the form of"whisps" of blood, blood flecks mostly toward the end of the study.  [name] POLYPECTOMY  09/19/2019   Procedure: POLYPECTOMY;  Surgeon: Danie Binder, MD;  [facility];  Service: Endoscopy;;  . UMBILICAL HERNIA REPAIR  2017   exploratory laparotomy, abdominal washout       Family History  Problem Relation Age of Onset  . Colon cancer Father 94       deceased age 40  . Breast cancer Sister        deceased  . Diabetes Sister   . Stroke Mother   . Healthy Son   . Healthy Daughter   . Lung cancer Neg Hx   . Ovarian cancer Neg Hx     Social History   Tobacco  Use  . Smoking status: Former Smoker    Packs/day: 2.00    Years: 25.00    Pack years: 50.00    Types: Cigarettes    Quit date: 01/28/1986    Years since quitting: 33.8  . Smokeless tobacco: Never Used  Vaping Use  . Vaping Use: Never used  Substance Use Topics  . Alcohol use: No    Comment: quit in 05/2013  . Drug use: No    Home Medications Prior to Admission medications   Medication Sig Start Date End Date Taking? Authorizing Provider  Ascorbic Acid (VITAMIN C) 1000 MG tablet Take 1,000 mg by mouth 2 (two) times daily.   Yes [provider]  cholestyramine Lucrezia Starch) 4 GM/DOSE powder Take 4 g by mouth in the morning.  08/03/19  Yes [provider]  furosemide (LASIX) 80 MG tablet Take 80 mg by mouth in the morning.  08/07/19  Yes [provider]  gabapentin (NEURONTIN) 100 MG capsule TAKE 1 CAPSULE BY MOUTH AT BEDTIME 10/05/19  Yes Waynetta Sandy, MD  HUMALOG KWIKPEN 100 UNIT/ML KwikPen as needed. Sliding scale 11/05/19  Yes [provider]  HYDROcodone-acetaminophen (NORCO) 10-325 MG tablet Take 1 tablet by mouth every 6 (six) hours as needed for moderate pain. Patient taking differently: Take 1 tablet by mouth as needed for moderate pain.  09/28/19  Yes Rhyne, Hulen Shouts, PA-C  hydrOXYzine (VISTARIL) 25 MG capsule Take 25 mg by mouth in the morning and at bedtime.  08/25/19  Yes [provider]  insulin degludec (TRESIBA FLEXTOUCH) 200 UNIT/ML FlexTouch Pen Inject 120 Units into the skin at bedtime. When blood sugars are up   Yes [provider]  iron polysaccharides (NIFEREX) 150 MG capsule Take 150 mg by mouth at bedtime.    Yes [provider]  metoprolol succinate (TOPROL-XL) 25 MG 24 hr tablet Take 25 mg by mouth daily. Only on Monday ,Wednesday and Friday 11/08/19  Yes [provider]  milk thistle 175 MG tablet Take 175 mg by mouth 3 (three) times daily.    Yes [provider]  mirtazapine  (REMERON) 15 MG tablet Take 15 mg by mouth at bedtime.   Yes [provider]  Multiple Vitamin (MULTIVITAMIN WITH MINERALS) TABS tablet Take 1 tablet by mouth in the morning.    Yes [provider]  omega-3 acid ethyl esters (LOVAZA) 1 g capsule Take 2 g by mouth 2 (two) times daily.  04/07/19  Yes [provider]  omeprazole (  PRILOSEC) 40 MG capsule Take 40 mg by mouth in the morning.    Yes [provider]  pravastatin (PRAVACHOL) 20 MG tablet Take 20 mg by mouth in the morning.    Yes [provider]  Probiotic Product (PROBIOTIC DAILY PO) Take 1 tablet by mouth in the morning.    Yes [provider]  rifaximin (XIFAXAN) 550 MG TABS tablet Take 1 tablet (550 mg total) by mouth 2 (two) times daily. 01/13/19  Yes Mahala Menghini, PA-C  spironolactone (ALDACTONE) 50 MG tablet Take 50 mg by mouth in the morning.    Yes [provider]  tamsulosin (FLOMAX) 0.4 MG CAPS capsule Take 0.4 mg by mouth every evening.   Yes [provider]  thiamine 100 MG tablet Take 100 mg by mouth in the morning. Vitamin B-1   Yes [provider]  tiZANidine (ZANAFLEX) 2 MG tablet Take 2 mg by mouth as needed for muscle spasms.    Yes [provider]  triamcinolone cream (KENALOG) 0.1 % Apply 1 application topically 2 (two) times daily as needed (dry skin Itching).  12/30/17  Yes [provider]  venlafaxine (EFFEXOR) 75 MG tablet Take 75 mg by mouth 3 (three) times daily with meals.    Yes [provider]  vitamin B-12 (CYANOCOBALAMIN) 1000 MCG tablet Take 1,000 mcg by mouth daily.   Yes [provider]    Allergies    Chlorhexidine and Tape  Review of Systems   Review of Systems  Unable to perform ROS: Mental status change  Neurological: Positive for tremors and weakness.  Psychiatric/Behavioral: Positive for confusion.  All other systems reviewed and are negative.   Physical Exam Updated Vital  Signs BP (!) 163/72 (BP Location: Right Arm)   Pulse 88   Temp 97.6 F (36.4 C) (Oral)   Resp 18   Ht 5\' 7"  (1.702 m)   Wt 117.9 kg   SpO2 98%   BMI 40.72 kg/m   Physical Exam Vitals and nursing note reviewed.  Constitutional:      Appearance: He is well-developed.  HENT:     Head: Normocephalic and atraumatic.     Mouth/Throat:     Mouth: Mucous membranes are dry.  Eyes:     Conjunctiva/sclera: Conjunctivae normal.  Cardiovascular:     Rate and Rhythm: Normal rate and regular rhythm.     Heart sounds: Normal heart sounds.  Pulmonary:     Effort: Pulmonary effort is normal.     Breath sounds: Normal breath sounds. No wheezing.  Abdominal:     General: Bowel sounds are normal. There is distension.     Palpations: Abdomen is soft.     Tenderness: There is abdominal tenderness in the epigastric area. There is no guarding or rebound.  Musculoskeletal:        General: Normal range of motion.     Cervical back: Normal range of motion.     Right lower leg: No edema.     Left lower leg: No edema.  Skin:    General: Skin is warm and dry.  Neurological:     Mental Status: He is lethargic and disoriented.     Cranial Nerves: No facial asymmetry.     Motor: Weakness present.     Comments: Patient unable to follow instructions for meaningful neuro exam.  Drifts to sleep mid exam maneuvers. Positive asterixis     ED Results / Procedures / Treatments   Labs (all labs ordered  are listed, but only abnormal results are displayed) Labs Reviewed  CBC WITH DIFFERENTIAL/PLATELET - Abnormal; Notable for the following components:      Result Value   WBC 3.4 (*)    RBC 2.43 (*)    Hemoglobin 7.1 (*)    HCT 23.2 (*)    RDW 16.1 (*)    Platelets 124 (*)    Lymphs Abs 0.3 (*)    All other components within normal limits  COMPREHENSIVE METABOLIC PANEL - Abnormal; Notable for the following components:   Sodium 124 (*)    Potassium 5.7 (*)    Chloride 87 (*)    BUN 55 (*)     Creatinine, Ser 4.74 (*)    Albumin 3.1 (*)    Alkaline Phosphatase 129 (*)    GFR calc non Af Amer 11 (*)    GFR calc Af Amer 13 (*)    All other components within normal limits  AMMONIA - Abnormal; Notable for the following components:   Ammonia 46 (*)    All other components within normal limits  URINALYSIS, ROUTINE W REFLEX MICROSCOPIC - Abnormal; Notable for the following components:   APPearance HAZY (*)    Leukocytes,Ua MODERATE (*)    Bacteria, UA FEW (*)    All other components within normal limits  SARS CORONAVIRUS 2 BY RT PCR (HOSPITAL ORDER, [place] [facility])  LIPASE, BLOOD    EKG None  Radiology No results found.  Canopy currently down. Verbal report from radiologist - negative acute findings on CT head. Procedures Procedures (including critical care time)  Medications Ordered in ED Medications - No data to display  ED Course  I have reviewed the triage vital signs and the nursing notes.  Pertinent labs & imaging results that were available during my care of the patient were reviewed by me and considered in my medical decision making (see chart for details).    MDM Rules/Calculators/A&P                          Patient with acute encephalopathy of unclear origin, however he does have significant hyponatremia.  Also mild hyperkalemia and has missed his dialysis both on Monday and this morning Wednesday.  He will require admission for further evaluation of his encephalopathy but will also require dialysis while he is here.  Discussed with Dr. Marval Regal who will arrange for dialysis.  Call placed to hospitalist for admission of this patient.  Discussed with Dr. Carles Collet who accepts pt for admission.   Final Clinical Impression(s) / ED Diagnoses Final diagnoses:  Hyponatremia  Hyperkalemia  Encephalopathy acute    Rx / DC Orders ED Discharge Orders    None       Landis Martins 12/13/19 1333    Noemi Chapel, MD 12/14/19  [phone]

## 2019-12-13 NOTE — ED Provider Notes (Signed)
Medical screening examination/treatment/procedure(s) were conducted as a shared visit with non-physician practitioner(s) and myself.  I personally evaluated the patient during the encounter.  Clinical Impression:   Final diagnoses:  Hyponatremia  Hyperkalemia  Encephalopathy acute    Patient is a 73 year old male presents ill-appearing with altered mental status.  The patient is unable to give me much information, level five caveat applies secondary to altered mental status however on exam the patient does appear slightly jaundiced, he has asterixis of his arms, he is significantly confused and not able to answer my questions appropriately.  He will lift both arms and both legs but has significant flapping of his hands when he holds them up.  He has no facial droop, when I ask him which leg I am touching he keeps giving me the #2.  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  Please see physician assistant notes for discussion with the spouse   Noemi Chapel, MD 12/14/19 [phone]

## 2019-12-13 NOTE — Procedures (Signed)
     HEMODIALYSIS TREATMENT NOTE:   Uneventful 4 hour heparin-free HD completed via [facility] TDC.  Exit site unremarkable. Goal met: 4 liters removed.  Was attempting 5L goal but UF was suspended for the last hour d/t soft BPs.  All blood was returned.  Lung aeration improved post-HD; otherwise, no changes from pre-treatment assessment.  Rockwell Alexandria, RN

## 2019-12-13 NOTE — ED Notes (Signed)
Dr. Sabra Heck assessed pt.

## 2019-12-13 NOTE — ED Notes (Signed)
[name] contacted about admission and room number

## 2019-12-13 NOTE — H&P (Signed)
History and Physical  Jeffrey [name] HWE:[id number] DOB: [birth year] DOA: 12/13/2019   PCP: Monico Blitz, MD   Patient coming from: Home  Chief Complaint: confusion  HPI:  Jeffrey [name] is a 74 y.o. male with medical history of alcoholic liver cirrhosis, ESRD (MWF), hypertension, diabetes mellitus type 2, hyperlipidemia, transfusion dependent anemia, portal hypertensive gastropathy, esophageal varices presenting with lethargy, generalized weakness, and confusion since 12/09/2019.  The patient cannot provide any significant history secondary to encephalopathy.  Also history is obtained from review of medical record and speaking with the patient's spouse.  The patient missed dialysis on 12/11/2019 secondary to a bruise generalized weakness.  His last HD was on 12/08/2019.  His only new medication is B12 supplementation.  There is not been any fevers, chills, chest pain, worsening shortness of breath, nausea, vomiting, diarrhea, abdominal pain, dysuria, hematuria.  The patient did have changed by urology on 12/12/2019.  His wife has noted the patient to have more " jerking type movements" of his upper extremities for the last 2 to 3 days.  At baseline, patient is alert and oriented x3 and uses a walker. In the emergency department, the patient was afebrile hemodynamically stable with oxygen saturation 99% on 3 L.  Sodium was 124, potassium 5.7, serum creatinine 1.74.  LFTs were unremarkable.  WBC 3.4, hemoglobin 7.1, platelets 125,000.  Nephrology was consulted to assist with management.  The patient was admitted for further evaluation and treatment of his encephalopathy.  Assessment/Plan: Hepatic encephalopathy -Spouse states that the patient was previously on lactulose, but this was stopped when he developed C. difficile resulting in profuse diarrhea.  Has not been on lactulose for 6 months. -Discontinue Questran -Start lactulose -Ammonia level 46 -A.m. ammonia -continue rifaximin  Acute metabolic  encephalopathy -Secondary to elevated ammonia and possible UTI -Start ceftriaxone -Obtain urine culture -Holding hypnotic medications including Remeron, gabapentin, Zanaflex, Vistaril  Pyuria - UA with 21-50 WBC -Start ceftriaxone pending culture data  Transfusion dependent anemia -Felt to be multifactorial including anemia of CKD, component of iron deficiency, and chronic blood loss anemia -Patient has had extensive GI work-up -09/19/2019 EGD--grade 1/2 varices, mild portal hypertensive gastropathy, moderate gastritis, single gastric polyp.  There is no obvious source of melena or GI bleeding.  It was felt that this blood loss may be due to friable gastric and duodenal mucosa in the setting of portal hypertension -Givens Capsule 09/21/19 unrevealing -Patient follows Dr. Delton Coombes -He is due to receive iron infusion 12/21/2019 -Continue B12 supplementation -A.m. CBC  ESRD -Nephrology consulted for maintenance dialysis -Last dialysis on 01/07/3809  Alcoholic liver cirrhosis -Continue furosemide and spironolactone -He has been abstinent from alcohol for over 5 years  Diabetes mellitus type 2 -09/18/19 A1C--5.4 -holding long acting insulin -novolog sliding scale  Hyperlipidemia -continue statin and lovaza  Hyperkalemia -remain on tele -HD per renal Essential HTN -Continue metoprolol succinate   Hyponatremia -likely due to missing HD and poor solute intake    Past Medical History:  Diagnosis Date  . A-fib [facility])    when initially stating dialysis january 2021 at [facility] per spouse; never heard anything else about it  . Alcoholic cirrhosis ([place])    patient reports completing hep a and b vaccines in 2006  . Anemia    has had 3 units of prbcs 2013  . Anxiety   . Arthritis   . Asymptomatic gallstones    Ultrasound in 2006  . C. difficile colitis   . Cholelithiasis   .  Chronic kidney disease    Dialysis M/W/F/Sa  . Depression   . Diabetes ([place])   . Duodenal  ulcer with hemorrhage    per patient in 2006 or 2007, records have been requested  . Dyspnea    low oxygen level -  has O2 2 L all day  . 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 GERD (gastroesophageal reflux disease)   . Heart burn   . 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 HOH (hard of hearing)   . Hypertension   . Liver cirrhosis ([place])   . Splenomegaly    Ultrasound in 2006   Past Surgical History:  Procedure Laterality Date  . AGILE CAPSULE N/A 09/20/2019   Procedure: AGILE CAPSULE;  Surgeon: Daneil Dolin, MD;  [facility];  Service: Endoscopy;  Laterality: N/A;  . APPENDECTOMY    . AV FISTULA PLACEMENT Left 08/17/2019   Procedure: ARTERIOVENOUS (AV) FISTULA CREATION VERSES ARTERIOVENOUS GRAFT;  Surgeon: [name], MD;  Location: [place];  Service: Vascular;  Laterality: Left;  . BASCILIC VEIN TRANSPOSITION Left 09/28/2019   Procedure: LEFT SECOND STAGE BASCILIC VEIN TRANSPOSITION;  Surgeon: [name], MD;  Location: [place];  Service: Vascular;  Laterality: Left;  . BIOPSY  02/03/2018   Procedure: BIOPSY;  Surgeon: Daneil Dolin, MD;  [facility];  Service: Endoscopy;;  bx of gastric polyps  . BIOPSY  09/19/2019   Procedure: BIOPSY;  Surgeon: Danie Binder, MD;  [facility];  Service: Endoscopy;;  . CATARACT EXTRACTION Right   . COLONOSCOPY  03/10/2005   Rectal polyp as described above, removed with snare. Left sided  diverticula. The remainder of the colonic mucosa appeared normal. Inflammed polyp on path.  . COLONOSCOPY  04/1999   Dr. Thea Silversmith polyps removed,  Path showed hyperplastic  . COLONOSCOPY  10/2015   Dr. Britta Mccreedy: diverticulosis, single sessile tubular adenoma 3-83mm in size removed from descending colon.   . COLONOSCOPY WITH ESOPHAGOGASTRODUODENOSCOPY (EGD)  03/31/2012   XLK:GMWNUUV AVMs. Colonic diverticulosis. tubular adenoma colon  . COLONOSCOPY WITH ESOPHAGOGASTRODUODENOSCOPY (EGD)  06/2019   FORSYTH: small  esophageal varices without high risk stigmata, single large AVM on lesser curvature in gastric body s/p APC ablation, gastric antral and duodenal bulb polyposis. No significant source to explain transfusion dependent anemia. Colonoscopy with portal colopathy and diffuse edema, changes of Cdiff colitis on colonoscopy  . ESOPHAGEAL BANDING N/A 02/03/2018   Procedure: ESOPHAGEAL BANDING;  Surgeon: Daneil Dolin, MD;  [facility];  Service: Endoscopy;  Laterality: N/A;  . ESOPHAGOGASTRODUODENOSCOPY  01/15/2005   Three columns grade 1 to 2 esophageal varices, otherwise normal esophageal mucosa.  Esophagus was not manipulated otherwise./Nodularity of the antrum with overlying erosions, nonspecific finding. Path showed rare H.pylori  . ESOPHAGOGASTRODUODENOSCOPY  09/2008   Dr. Gaylene Brooks columns of grade 2-3 esoph varices, only one column was prominent. Portal gastropathy, multiple gastrc polyps at antrum, two were 2cm with black eschar, bulbar polyps, bulbar erosions  . ESOPHAGOGASTRODUODENOSCOPY  11/2004   Dr. Brantley Stage 3 esoph varices  . ESOPHAGOGASTRODUODENOSCOPY  03/31/2012   RMR: 4 columns(3-GR 2, 1-GR1) non-bleeding esophageal varices, portal gastropathy, small HH, early GAVE, multiple gastric polyps   . ESOPHAGOGASTRODUODENOSCOPY (EGD) WITH PROPOFOL N/A 02/03/2018   Dr. Gala Romney: Esophageal varices, 3 columns grade 1-2.  Portal hypertensive gastropathy.  Multiple gastric polyps, biopsy consistent with hyperplastic.  [name] ESOPHAGOGASTRODUODENOSCOPY (EGD) WITH PROPOFOL N/A 09/19/2019   Fields:  grade I and II esophageal varcies, mild portal hypertensive gastropathy, moderate gastritis but no H. pylori, single hyperplastic gastric polyp removed, obvious source for melena/transfusion dependent anemia not identified, may be due to friable gastric and duodenal mucosa in the setting of portal hypertension  . GASTRIC VARICES BANDING  03/31/2012   Procedure: GASTRIC VARICES BANDING;  Surgeon:  Daneil Dolin, MD;  [facility];  Service: Endoscopy;  Laterality: N/A;  . GIVENS CAPSULE STUDY N/A 09/21/2019   Poor study with a lot of debris obstructing much of the view of the first approximate 3 hours out of 6 hours.  No obvious source of bleeding identified.  Sequela of bleeding and old blood seen in the form of"whisps" of blood, blood flecks mostly toward the end of the study.  [name] POLYPECTOMY  09/19/2019   Procedure: POLYPECTOMY;  Surgeon: Danie Binder, MD;  [facility];  Service: Endoscopy;;  . UMBILICAL HERNIA REPAIR  2017   exploratory laparotomy, abdominal washout   Social History:  reports that he quit smoking about 33 years ago. His smoking use included cigarettes. He has a 50.00 pack-year smoking history. He has never used smokeless tobacco. He reports that he does not drink alcohol and does not use drugs.   Family History  Problem Relation Age of Onset  . Colon cancer Father 63       deceased age 52  . Breast cancer Sister        deceased  . Diabetes Sister   . Stroke Mother   . Healthy Son   . Healthy Daughter   . Lung cancer Neg Hx   . Ovarian cancer Neg Hx      Allergies  Allergen Reactions  . Chlorhexidine   . Tape Itching and Rash     Prior to Admission medications   Medication Sig Start Date End Date Taking? Authorizing Provider  Ascorbic Acid (VITAMIN C) 1000 MG tablet Take 1,000 mg by mouth 2 (two) times daily.   Yes [provider]  cholestyramine Lucrezia Starch) 4 GM/DOSE powder Take 4 g by mouth in the morning.  08/03/19  Yes [provider]  furosemide (LASIX) 80 MG tablet Take 80 mg by mouth in the morning.  08/07/19  Yes [provider]  gabapentin (NEURONTIN) 100 MG capsule TAKE 1 CAPSULE BY MOUTH AT BEDTIME 10/05/19  Yes Waynetta Sandy, MD  HUMALOG KWIKPEN 100 UNIT/ML KwikPen as needed. Sliding scale 11/05/19  Yes [provider]  HYDROcodone-acetaminophen (NORCO) 10-325 MG tablet Take 1  tablet by mouth every 6 (six) hours as needed for moderate pain. Patient taking differently: Take 1 tablet by mouth as needed for moderate pain.  09/28/19  Yes Rhyne, Hulen Shouts, PA-C  hydrOXYzine (VISTARIL) 25 MG capsule Take 25 mg by mouth in the morning and at bedtime.  08/25/19  Yes [provider]  insulin degludec (TRESIBA FLEXTOUCH) 200 UNIT/ML FlexTouch Pen Inject 120 Units into the skin at bedtime. When blood sugars are up   Yes [provider]  iron polysaccharides (NIFEREX) 150 MG capsule Take 150 mg by mouth at bedtime.    Yes [provider]  metoprolol succinate (TOPROL-XL) 25 MG 24 hr tablet Take 25 mg by mouth daily. Only on Monday ,Wednesday and Friday 11/08/19  Yes [provider]  milk thistle 175 MG tablet Take 175 mg by mouth 3 (three) times daily.    Yes [provider]  mirtazapine (REMERON) 15 MG tablet Take 15 mg  by mouth at bedtime.   Yes [provider]  Multiple Vitamin (MULTIVITAMIN WITH MINERALS) TABS tablet Take 1 tablet by mouth in the morning.    Yes [provider]  omega-3 acid ethyl esters (LOVAZA) 1 g capsule Take 2 g by mouth 2 (two) times daily.  04/07/19  Yes [provider]  omeprazole (PRILOSEC) 40 MG capsule Take 40 mg by mouth in the morning.    Yes [provider]  pravastatin (PRAVACHOL) 20 MG tablet Take 20 mg by mouth in the morning.    Yes [provider]  Probiotic Product (PROBIOTIC DAILY PO) Take 1 tablet by mouth in the morning.    Yes [provider]  rifaximin (XIFAXAN) 550 MG TABS tablet Take 1 tablet (550 mg total) by mouth 2 (two) times daily. 01/13/19  Yes Mahala Menghini, PA-C  spironolactone (ALDACTONE) 50 MG tablet Take 50 mg by mouth in the morning.    Yes [provider]  tamsulosin (FLOMAX) 0.4 MG CAPS capsule Take 0.4 mg by mouth every evening.   Yes [provider]  thiamine 100 MG tablet Take 100 mg by mouth in the morning.  Vitamin B-1   Yes [provider]  tiZANidine (ZANAFLEX) 2 MG tablet Take 2 mg by mouth as needed for muscle spasms.    Yes [provider]  triamcinolone cream (KENALOG) 0.1 % Apply 1 application topically 2 (two) times daily as needed (dry skin Itching).  12/30/17  Yes [provider]  venlafaxine (EFFEXOR) 75 MG tablet Take 75 mg by mouth 3 (three) times daily with meals.    Yes [provider]  vitamin B-12 (CYANOCOBALAMIN) 1000 MCG tablet Take 1,000 mcg by mouth daily.   Yes [provider]    Review of Systems:  Unobtainable due to encephalopathy  Physical Exam: Vitals:   12/13/19 1216 12/13/19 1226 12/13/19 1236 12/13/19 1246  BP:      Pulse: 89 90 88 88  Resp: (!) 22 20    Temp:      TempSrc:      SpO2: 97% 91% 96% 97%  Weight:      Height:       General:  A&O x 1, NAD, nontoxic, pleasant/cooperative Head/Eye: No conjunctival hemorrhage, no icterus, [place]/AT, No nystagmus ENT:  No icterus,  No thrush, good dentition, no pharyngeal exudate Neck:  No masses, no lymphadenpathy, no bruits CV:  RRR, no rub, no gallop, no S3 Lung:  Bibasilar crackles. Abdomen: soft/NT, +BS, nondistended, no peritoneal signs Ext: No cyanosis, No rashes, No petechiae, No lymphangitis, No edema +asterixis Neuro: CNII-XII intact, strength 4/5 in bilateral upper and lower extremities, no dysmetria  Labs on Admission:  Basic Metabolic Panel: Recent Labs  Lab 12/13/19 1048  NA 124*  K 5.7*  CL 87*  CO2 23  GLUCOSE 96  BUN 55*  CREATININE 4.74*  CALCIUM 9.1   Liver Function Tests: Recent Labs  Lab 12/13/19 1048  AST 22  ALT 21  ALKPHOS 129*  BILITOT 0.9  PROT 7.1  ALBUMIN 3.1*   Recent Labs  Lab 12/13/19 1048  LIPASE 24   Recent Labs  Lab 12/13/19 1048  AMMONIA 46*   CBC: Recent Labs  Lab 12/13/19 1048  WBC 3.4*  NEUTROABS 2.5  HGB 7.1*  HCT 23.2*  MCV 95.5  PLT 124*   Coagulation Profile: No results for input(s): INR,  PROTIME in the last 168 hours. Cardiac Enzymes: No results for input(s): CKTOTAL, CKMB,  CKMBINDEX, TROPONINI in the last 168 hours. BNP: Invalid input(s): POCBNP CBG: No results for input(s): GLUCAP in the last 168 hours. Urine analysis:    Component Value Date/Time   COLORURINE YELLOW 12/13/2019 1120   APPEARANCEUR HAZY (A) 12/13/2019 1120   LABSPEC 1.010 12/13/2019 1120   PHURINE 5.0 12/13/2019 1120   GLUCOSEU NEGATIVE 12/13/2019 1120   HGBUR NEGATIVE 12/13/2019 1120   BILIRUBINUR NEGATIVE 12/13/2019 1120   KETONESUR NEGATIVE 12/13/2019 1120   PROTEINUR NEGATIVE 12/13/2019 1120   NITRITE NEGATIVE 12/13/2019 1120   LEUKOCYTESUR MODERATE (A) 12/13/2019 1120   Sepsis Labs: @LABRCNTIP (procalcitonin:4,lacticidven:4) ) Recent Results (from the past 240 hour(s))  SARS Coronavirus 2 by RT PCR (hospital order, performed in [place] hospital lab) Nasopharyngeal Nasopharyngeal Swab     Status: None   Collection Time: 12/13/19 11:36 AM   Specimen: Nasopharyngeal Swab  Result Value Ref Range Status   SARS Coronavirus 2 NEGATIVE NEGATIVE Final    Comment: (NOTE) SARS-CoV-2 target nucleic acids are NOT DETECTED.  The SARS-CoV-2 RNA is generally detectable in upper and lower respiratory specimens during the acute phase of infection. The lowest concentration of SARS-CoV-2 viral copies this assay can detect is 250 copies / mL. A negative result does not preclude SARS-CoV-2 infection and should not be used as the sole basis for treatment or other patient management decisions.  A negative result may occur with improper specimen collection / handling, submission of specimen other than nasopharyngeal swab, presence of viral mutation(s) within the areas targeted by this assay, and inadequate number of viral copies (<250 copies / mL). A negative result must be combined with clinical observations, patient history, and epidemiological information.  Fact Sheet for Patients:     StrictlyIdeas.no  Fact Sheet for Healthcare Providers: BankingDealers.co.za  This test is not yet approved or  cleared by the [place] FDA and has been authorized for detection and/or diagnosis of SARS-CoV-2 by FDA under an Emergency Use Authorization (EUA).  This EUA will remain in effect (meaning this test can be used) for the duration of the COVID-19 declaration under Section 564(b)(1) of the Act, 21 U.S.C. section 360bbb-3(b)(1), unless the authorization is terminated or revoked sooner.  Performed at [facility], [address], [place], [place] [address]      Radiological Exams on Admission: CT Head Wo Contrast  Result Date: 12/13/2019 CLINICAL DATA:  Altered mental status. EXAM: CT HEAD WITHOUT CONTRAST TECHNIQUE: Contiguous axial images were obtained from the base of the skull through the vertex without intravenous contrast. COMPARISON:  September 08, 2019. FINDINGS: Brain: Mild chronic ischemic white matter disease is noted. No mass effect or midline shift is noted. Ventricular size is within normal limits. There is no evidence of mass lesion, hemorrhage or acute infarction. Vascular: No hyperdense vessel or unexpected calcification. Skull: Normal. Negative for fracture or focal lesion. Sinuses/Orbits: No acute finding. Other: None. IMPRESSION: Mild chronic ischemic white matter disease. No acute intracranial abnormality seen. Electronically Signed   By: Marijo Conception M.D.   On: 12/13/2019 13:07    EKG: Independently reviewed. NSR, no STT changes    Time spent:70 minutes Code Status:   FULL Family Communication: spouse updated 7/14 Disposition Plan: expect 2-3 day hospitalization Consults called: renal, palliative DVT Prophylaxis:   SCDs  Orson Eva, DO  [facility] Pager [phone]  If 7PM-7AM, please contact night-coverage [email or web address] Password [email or web address] 12/13/2019, 2:01 PM

## 2019-12-13 NOTE — ED Triage Notes (Signed)
Pt arrived EMS , family stating pt is weak. Pt did not go to dialysis Mon or today but did go to the Dr. Wilburn Mylar. Family gave pt Norco 10-325mg   this morning because he didn't feel well. Pt is lethargic and has AMS.

## 2019-12-14 ENCOUNTER — Ambulatory Visit (HOSPITAL_COMMUNITY): Payer: Medicare Other

## 2019-12-14 ENCOUNTER — Encounter (HOSPITAL_COMMUNITY): Payer: Self-pay

## 2019-12-14 DIAGNOSIS — N186 End stage renal disease: Secondary | ICD-10-CM

## 2019-12-14 DIAGNOSIS — Z515 Encounter for palliative care: Secondary | ICD-10-CM

## 2019-12-14 DIAGNOSIS — Z992 Dependence on renal dialysis: Secondary | ICD-10-CM

## 2019-12-14 DIAGNOSIS — Z7189 Other specified counseling: Secondary | ICD-10-CM

## 2019-12-14 LAB — CBC
HCT: 22.2 % — ABNORMAL LOW (ref 39.0–52.0)
Hemoglobin: 6.6 g/dL — CL (ref 13.0–17.0)
MCH: 28.4 pg (ref 26.0–34.0)
MCHC: 29.7 g/dL — ABNORMAL LOW (ref 30.0–36.0)
MCV: 95.7 fL (ref 80.0–100.0)
Platelets: 130 10*3/uL — ABNORMAL LOW (ref 150–400)
RBC: 2.32 MIL/uL — ABNORMAL LOW (ref 4.22–5.81)
RDW: 16.3 % — ABNORMAL HIGH (ref 11.5–15.5)
WBC: 4.9 10*3/uL (ref 4.0–10.5)
nRBC: 0 % (ref 0.0–0.2)

## 2019-12-14 LAB — GLUCOSE, CAPILLARY
Glucose-Capillary: 66 mg/dL — ABNORMAL LOW (ref 70–99)
Glucose-Capillary: 54 mg/dL — ABNORMAL LOW (ref 70–99)
Glucose-Capillary: 55 mg/dL — ABNORMAL LOW (ref 70–99)
Glucose-Capillary: 108 mg/dL — ABNORMAL HIGH (ref 70–99)
Glucose-Capillary: 100 mg/dL — ABNORMAL HIGH (ref 70–99)
Glucose-Capillary: 64 mg/dL — ABNORMAL LOW (ref 70–99)
Glucose-Capillary: 135 mg/dL — ABNORMAL HIGH (ref 70–99)

## 2019-12-14 LAB — BASIC METABOLIC PANEL
Anion gap: 9 (ref 5–15)
BUN: 24 mg/dL — ABNORMAL HIGH (ref 8–23)
CO2: 27 mmol/L (ref 22–32)
Calcium: 8.8 mg/dL — ABNORMAL LOW (ref 8.9–10.3)
Chloride: 95 mmol/L — ABNORMAL LOW (ref 98–111)
Creatinine, Ser: 2.58 mg/dL — ABNORMAL HIGH (ref 0.61–1.24)
GFR calc Af Amer: 28 mL/min — ABNORMAL LOW (ref >60)
GFR calc non Af Amer: 24 mL/min — ABNORMAL LOW (ref >60)
Glucose, Bld: 69 mg/dL — ABNORMAL LOW (ref 70–99)
Potassium: 4.4 mmol/L (ref 3.5–5.1)
Sodium: 131 mmol/L — ABNORMAL LOW (ref 135–145)

## 2019-12-14 LAB — PREPARE RBC (CROSSMATCH)

## 2019-12-14 LAB — HEPATITIS B SURFACE ANTIGEN: Hepatitis B Surface Ag: NONREACTIVE

## 2019-12-14 LAB — AMMONIA: Ammonia: 96 umol/L — ABNORMAL HIGH (ref 9–35)

## 2019-12-14 LAB — OCCULT BLOOD X 1 CARD TO LAB, STOOL: Fecal Occult Bld: POSITIVE — AB

## 2019-12-14 MED ORDER — SILVER NITRATE-POT NITRATE 75-25 % EX MISC
1.0000 "application " | Freq: Once | CUTANEOUS | Status: DC
  Filled 2019-12-14: qty 10

## 2019-12-14 MED ORDER — SODIUM CHLORIDE 0.9% IV SOLUTION: Freq: Once | INTRAVENOUS | Status: AC

## 2019-12-14 MED ORDER — OXYMETAZOLINE HCL 0.05 % NA SOLN
2.0000 | Freq: Once | NASAL | Status: AC
  Administered 2019-12-14: 2 via NASAL
  Filled 2019-12-14: qty 30

## 2019-12-14 MED ORDER — ALBUMIN HUMAN 25 % IV SOLN: 25.0000 g | Freq: Once | INTRAVENOUS | Status: DC

## 2019-12-14 MED ORDER — DARBEPOETIN ALFA 150 MCG/0.3ML IJ SOSY: 150.0000 ug | PREFILLED_SYRINGE | INTRAMUSCULAR | Status: DC

## 2019-12-14 MED ORDER — VANCOMYCIN HCL 1500 MG/300ML IV SOLN
1500.0000 mg | Freq: Once | INTRAVENOUS | Status: AC
  Administered 2019-12-14: 1500 mg via INTRAVENOUS
  Filled 2019-12-14: qty 300

## 2019-12-14 NOTE — Progress Notes (Signed)
PROGRESS NOTE  Jeffrey [name] QIO:[id number] DOB: 1946-01-06 DOA: 12/13/2019 PCP: Monico Blitz, MD  Brief History:   74 y.o. male with medical history of alcoholic liver cirrhosis, ESRD (MWF), hypertension, diabetes mellitus type 2, hyperlipidemia, transfusion dependent anemia, portal hypertensive gastropathy, esophageal varices presenting with lethargy, generalized weakness, and confusion since 12/09/2019.  The patient cannot provide any significant history secondary to encephalopathy.  Also history is obtained from review of medical record and speaking with the patient's spouse.  The patient missed dialysis on 12/11/2019 secondary to a bruise generalized weakness.  His last HD was on 12/08/2019.  His only new medication is B12 supplementation.  There is not been any fevers, chills, chest pain, worsening shortness of breath, nausea, vomiting, diarrhea, abdominal pain, dysuria, hematuria.  The patient did have changed by urology on 12/12/2019.  His wife has noted the patient to have more " jerking type movements" of his upper extremities for the last 2 to 3 days.  At baseline, patient is alert and oriented x3 and uses a walker. In the emergency department, the patient was afebrile hemodynamically stable with oxygen saturation 99% on 3 L.  Sodium was 124, potassium 5.7, serum creatinine 1.74.  LFTs were unremarkable.  WBC 3.4, hemoglobin 7.1, platelets 125,000.  Nephrology was consulted to assist with management.  The patient was admitted for further evaluation and treatment of his encephalopathy.  Assessment/Plan:  Hepatic encephalopathy -Spouse states that the patient was previously on lactulose, but this was stopped when he developed C. difficile resulting in profuse diarrhea.  Has not been on lactulose for 6 months. -Discontinue Questran -Continue lactulose -Ammonia level 46>>96 -A.m. ammonia -continue rifaximin -a little more alert, but remains confused and slow to respond  Acute metabolic  encephalopathy -Secondary to elevated ammonia and possible UTI -Start ceftriaxone -Obtained urine culture -Holding hypnotic medications including Remeron, gabapentin, Zanaflex, Vistaril  Pyuria - UA with 21-50 WBC -Continue ceftriaxone pending culture data  Transfusion dependent anemia/Chronic Blood loss anemia -Felt to be multifactorial including anemia of CKD, component of iron deficiency, and chronic blood loss anemia -Patient has had extensive GI work-up -09/19/2019 EGD--grade 1/2 varices, mild portal hypertensive gastropathy, moderate gastritis, single gastric polyp.  There is no obvious source of melena or GI bleeding.  It was felt that this blood loss may be due to friable gastric and duodenal mucosa in the setting of portal hypertension -Givens Capsule 09/21/19 unrevealing -Patient follows Dr. 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  Diabetes mellitus type 2 -09/18/19 A1C--5.4 -holding long acting insulin -discontinue novolog sliding scale -having hypoglycemia due to poor po intake  Hyperlipidemia -continue statin and lovaza  Hyperkalemia -remain on tele -HD per renal -improved with HD  Essential HTN -Continue metoprolol succinate   Hyponatremia -likely due to missing HD and poor solute intake and liver cirrhosis -improving with HD     Status is: Inpatient  Remains inpatient appropriate because:Altered mental status   Dispo: The patient is from: Home              Anticipated d/c is to: Home              Anticipated d/c  date is: 2 days              Patient currently is not medically stable to  d/c.        Family Communication:   Spouse updated 7/15  Consultants:  Renal, GI, palliative  Code Status:  FULL   DVT Prophylaxis:  SCDs   Procedures: As Listed in Progress Note Above  Antibiotics: Ceftriaxone 7/14>>   Total time spent 35 minutes.  Greater than 50% spent face to face counseling and coordinating care.   Subjective: Patient denies fevers, chills, headache, chest pain, dyspnea, nausea, vomiting, diarrhea, abdominal pain, dysuria,  -he remains confused and has delayed responses.   Objective: Vitals:   12/14/19 0100 12/14/19 0144 12/14/19 0550 12/14/19 0551  BP: 128/63 (!) 127/94  130/63  Pulse: 92 100 95 94  Resp: 16 20 18    Temp: 98 F (36.7 C) 98 F (36.7 C) 97.9 F (36.6 C)   TempSrc: Oral Oral Oral   SpO2:  97% 100% 100%  Weight:      Height:        Intake/Output Summary (Last 24 hours) at 12/14/2019 1129 Last data filed at 12/14/2019 0600 Gross per 24 hour  Intake --  Output 4319 ml  Net -4319 ml   Weight change:  Exam:   General:  Pt is alert, follows commands appropriately, not in acute distress  HEENT: No icterus, No thrush, No neck mass, [place]/AT  Cardiovascular: RRR, S1/S2, no rubs, no gallops  Respiratory: bibasilar crackles. No wheeze  Abdomen: Soft/+BS, non tender, non distended, no guarding  Extremities: No edema, No lymphangitis, No petechiae, No rashes, no synovitis   Data Reviewed: I have personally reviewed following labs and imaging studies Basic Metabolic Panel: Recent Labs  Lab 12/13/19 1048 12/13/19 1837 12/14/19 0601  NA 124* 125* 131*  K 5.7* 5.4* 4.4  CL 87* 89* 95*  CO2 23 24 27   GLUCOSE 96 74 69*  BUN 55* 56* 24*  CREATININE 4.74* 4.91* 2.58*  CALCIUM 9.1 9.0 8.8*  PHOS  --  7.2*  --    Liver Function Tests: Recent Labs  Lab 12/13/19 1048 12/13/19 1837  AST 22  --   ALT 21  --   ALKPHOS 129*  --   BILITOT 0.9  --   PROT 7.1  --   ALBUMIN 3.1* 3.1*   Recent Labs  Lab  12/13/19 1048  LIPASE 24   Recent Labs  Lab 12/13/19 1048 12/14/19 0601  AMMONIA 46* 96*   Coagulation Profile: No results for input(s): INR, PROTIME in the last 168 hours. CBC: Recent Labs  Lab 12/13/19 1048 12/13/19 1837 12/14/19 0601  WBC 3.4* 4.0 4.9  NEUTROABS 2.5  --   --   HGB 7.1* 7.3* 6.6*  HCT 23.2* 24.0* 22.2*  MCV 95.5 95.6 95.7  PLT 124* 132* 130*   Cardiac Enzymes: No results for input(s): CKTOTAL, CKMB, CKMBINDEX, TROPONINI in the last 168 hours. BNP: Invalid input(s): POCBNP CBG: Recent Labs  Lab 12/13/19 1951 12/13/19 2112 12/14/19 0742 12/14/19 1054 12/14/19 1123  GLUCAP 133* 125* 66* 54* 55*   HbA1C: No results for input(s): HGBA1C in the last 72 hours. Urine analysis:    Component Value Date/Time   COLORURINE YELLOW 12/13/2019 1120   APPEARANCEUR HAZY (A) 12/13/2019 1120   LABSPEC 1.010 12/13/2019 1120   PHURINE 5.0 12/13/2019 1120   GLUCOSEU NEGATIVE 12/13/2019 1120   HGBUR NEGATIVE 12/13/2019 1120   [place] 12/13/2019 1120  KETONESUR NEGATIVE 12/13/2019 1120   PROTEINUR NEGATIVE 12/13/2019 1120   NITRITE NEGATIVE 12/13/2019 1120   LEUKOCYTESUR MODERATE (A) 12/13/2019 1120   Sepsis Labs: @LABRCNTIP (procalcitonin:4,lacticidven:4) ) Recent Results (from the past 240 hour(s))  SARS Coronavirus 2 by RT PCR (hospital order, performed in [facility] hospital lab) Nasopharyngeal Nasopharyngeal Swab     Status: None   Collection Time: 12/13/19 11:36 AM   Specimen: Nasopharyngeal Swab  Result Value Ref Range Status   SARS Coronavirus 2 NEGATIVE NEGATIVE Final    Comment: (NOTE) SARS-CoV-2 target nucleic acids are NOT DETECTED.  The SARS-CoV-2 RNA is generally detectable in upper and lower respiratory specimens during the acute phase of infection. The lowest concentration of SARS-CoV-2 viral copies this assay can detect is 250 copies / mL. A negative result does not preclude SARS-CoV-2 infection and should not be used as  the sole basis for treatment or other patient management decisions.  A negative result may occur with improper specimen collection / handling, submission of specimen other than nasopharyngeal swab, presence of viral mutation(s) within the areas targeted by this assay, and inadequate number of viral copies (<250 copies / mL). A negative result must be combined with clinical observations, patient history, and epidemiological information.  Fact Sheet for Patients:   StrictlyIdeas.no  Fact Sheet for Healthcare Providers: BankingDealers.co.za  This test is not yet approved or  cleared by the [place] FDA and has been authorized for detection and/or diagnosis of SARS-CoV-2 by FDA under an Emergency Use Authorization (EUA).  This EUA will remain in effect (meaning this test can be used) for the duration of the COVID-19 declaration under Section 564(b)(1) of the Act, 21 U.S.C. section 360bbb-3(b)(1), unless the authorization is terminated or revoked sooner.  Performed at [facility], [address], [place], [place] [address]   MRSA PCR Screening     Status: None   Collection Time: 12/13/19  8:45 PM   Specimen: Nasopharyngeal  Result Value Ref Range Status   MRSA by PCR NEGATIVE NEGATIVE Final    Comment:        The GeneXpert MRSA Assay (FDA approved for NASAL specimens only), is one component of a comprehensive MRSA colonization surveillance program. It is not intended to diagnose MRSA infection nor to guide or monitor treatment for MRSA infections. Performed at [facility], [address], [place], [place] [address]      Scheduled Meds: . sodium chloride   Intravenous Once  . vitamin C  1,000 mg Oral BID  . [START ON 12/20/2019] darbepoetin (ARANESP) injection - DIALYSIS  150 mcg Intravenous Q Wed-HD  . furosemide  80 mg Oral q AM  . insulin aspart  0-5 Units Subcutaneous QHS  . insulin aspart  0-9 Units Subcutaneous TID WC   . iron polysaccharides  150 mg Oral QHS  . lactulose  30 g Oral BID  . metoprolol succinate  25 mg Oral Q M,W,F  . multivitamin with minerals  1 tablet Oral q AM  . omega-3 acid ethyl esters  2 g Oral BID  . pravastatin  20 mg Oral q AM  . rifaximin  550 mg Oral BID  . thiamine  100 mg Oral q AM  . venlafaxine  75 mg Oral TID WC  . vitamin B-12  1,000 mcg Oral Daily   Continuous Infusions: . sodium chloride    . sodium chloride    . cefTRIAXone (ROCEPHIN)  IV 1 g (12/13/19 1849)    Procedures/Studies: CT Head Wo Contrast  Result Date: 12/13/2019 CLINICAL DATA:  Altered mental status. EXAM: CT HEAD WITHOUT CONTRAST TECHNIQUE: Contiguous axial images were obtained from the base of the skull through the vertex without intravenous contrast. COMPARISON:  September 08, 2019. FINDINGS: Brain: Mild chronic ischemic white matter disease is noted. No mass effect or midline shift is noted. Ventricular size is within normal limits. There is no evidence of mass lesion, hemorrhage or acute infarction. Vascular: No hyperdense vessel or unexpected calcification. Skull: Normal. Negative for fracture or focal lesion. Sinuses/Orbits: No acute finding. Other: None. IMPRESSION: Mild chronic ischemic white matter disease. No acute intracranial abnormality seen. Electronically Signed   By: Marijo Conception M.D.   On: 12/13/2019 13:07    Orson Eva, DO  [facility]  If 7PM-7AM, please contact night-coverage [email or web address] Password [email or web address] 12/14/2019, 11:29 AM   LOS: 1 day

## 2019-12-14 NOTE — Progress Notes (Signed)
Responded to nursing call:  Continued epistaxis   Subjective: Denies headache, cp, sob, n/v. Continues to spit up blood with intermittent anterior blood drip from left nare  Vitals:   12/14/19 0551 12/14/19 1146 12/14/19 1200 12/14/19 1238  BP: 130/63 (!) 142/63 (!) 142/63 (!) 143/56  Pulse: 94 99 99 (!) 102  Resp:  15 15 16   Temp:  98.7 F (37.1 C) 98.7 F (37.1 C) 97.9 F (36.6 C)  TempSrc:  Oral Oral   SpO2: 100% 100% 100% 100%  Weight:      Height:       CV--RRR Lung--scattered rales ENT--examined left nare--no visible blood vessel or clot.  No visible active bleed anteriorly.  Noted blood in posterior pharynx  HR 108--RR 18--157/68  Assessment/Plan:  Epistaxis -nothing visible anteriorly to chemically cauterize -pt is awake and alert x 3 and following commands -discussed with EDP, Dr. Hinton Lovely gather materials and send assistance to place [place]    Jeffrey Brow Shakyia Bosso, DO [facility]

## 2019-12-14 NOTE — Consult Note (Signed)
Referring Provider: Orson Eva, MD Primary Care Physician:  Monico Blitz, MD Primary Gastroenterologist:  Garfield Cornea, MD  Reason for Consultation:  Hepatic encephalopathy and blood loss anemia.  HPI: Jeffrey [name] is a 74 y.o. male with history of alcoholic cirrhosis (quit in 2014), on dialysis since earlier in the year for possible hepatorenal syndrome, esophageal varices with banding in the past, hepatic encephalopathy, h/o SBP therefore nonselective beta-blocker discontinued at that time (had been on chronic Bactrim double strength daily until he developed refractory Cdiff in 07/2199), GI bleeding, transfusion dependent anemia, history of C. Difficile. He also has urinary retention with chronic indwelling Foley catheter.  Patient presented to the emergency department for for lethargy, generalized weakness, and confusion since 12/09/19. Information obtained from chart. Patient more alert and oriented but sluggish and unable to provide complete history. He missed dialysis 7/12, last HD on 12/08/19. No reported N/V, diarrhea, abdominal pain. Wife had noted more jerking movements of his upper extremities. Saw urology on 12/12/19, catheter change performed.    Received blood transfusion as outpatient on November 21, 2019. Pretransfusion hemoglobin of 6.9. Posttransfusion hemoglobin of 7.4. On presentation yesterday his hemoglobin was 7.9. Platelets 124,000, white blood cell count 3400. In the ED his ammonia was 46. Urinalysis with moderate leukocytes. Covid negative. Creatinine was 4.74, BUN 55, sodium 124, potassium 5.7. CT head with no acute changes.  Today sodium was 131, potassium 4.4, creatinine 2.58, hemoglobin 6.6, platelets 130,000, ammonia 96. He has an order for 2 units of packed red blood cells. He is on Rocephin 1 g daily. Lactulose has been held for the last several months due to history of C. difficile with persisting diarrhea. He was treated with Xifaxan for his encephalopathy. Lactulose resumed  this admission at 30 g twice daily, Xifaxan 550 mg twice daily continue. He is on omeprazole as an outpatient.  Patient is being followed by hematology for pancytopenia/anemia, last seen 12/07/19. Plans for IV iron.  Prior pertinent GI work-up  EGD September 19, 2019: Grade 1 and grade 2 esophageal varices with no stigmata of recent bleeding. - MILD Portal hypertensive gastropathy. - MODERATE Gastritis. Biopsied.(chronic inactive gastritis, no H.pylori) - A single gastric polyp. Resected and retrieved.(hyperplastic polyps) - NO OBVOUS SOURCE FOR MELENA/TRANSFUSION DEPENDENT ANEMIA IDENTIFIED. MAY BE DUE TO FRIABLE GASTRIC AND DUODENAL MUCOSA IN SETTING OF PORTAL HYPERTENSION  Small bowel Givens capsule on September 22, 2019, poor study with a lot of debris obstructing much of the view of the first approximate 3 hours out of 6 hours. No obvious source of bleeding. Sequelae of bleeding and old blood seen in the form of "whisps" of blood, blood flecks mostly toward the end of the study.  EGD 06/2019: small esophageal varices without stigmata of bleeding, single large AVM on lesser curvature of gastric body s/p APC, gastric antral and duodenal bulb polyposis.   January 2021: Colonoscopy showed severe C. difficile colitis, portal colopathy diffuse edema.  Abdominal ultrasound November 28, 2019 through care everywhere showed gallstones but no cholecystitis, cirrhotic liver with splenomegaly, small to moderate volume ascites.  Prior to Admission medications   Medication Sig Start Date End Date Taking? Authorizing Provider  Ascorbic Acid (VITAMIN C) 1000 MG tablet Take 1,000 mg by mouth 2 (two) times daily.   Yes [provider]  cholestyramine Lucrezia Starch) 4 GM/DOSE powder Take 4 g by mouth in the morning.  08/03/19  Yes [provider]  furosemide (LASIX) 80 MG tablet Take 80 mg by mouth in the morning.  08/07/19  Yes [provider]  gabapentin (NEURONTIN) 100 MG capsule TAKE 1  CAPSULE BY MOUTH AT BEDTIME 10/05/19  Yes Waynetta Sandy, MD  HUMALOG KWIKPEN 100 UNIT/ML KwikPen as needed. Sliding scale 11/05/19  Yes [provider]  HYDROcodone-acetaminophen (NORCO) 10-325 MG tablet Take 1 tablet by mouth every 6 (six) hours as needed for moderate pain. Patient taking differently: Take 1 tablet by mouth as needed for moderate pain.  09/28/19  Yes Rhyne, Hulen Shouts, PA-C  hydrOXYzine (VISTARIL) 25 MG capsule Take 25 mg by mouth in the morning and at bedtime.  08/25/19  Yes [provider]  insulin degludec (TRESIBA FLEXTOUCH) 200 UNIT/ML FlexTouch Pen Inject 120 Units into the skin at bedtime. When blood sugars are up   Yes [provider]  iron polysaccharides (NIFEREX) 150 MG capsule Take 150 mg by mouth at bedtime.    Yes [provider]  metoprolol succinate (TOPROL-XL) 25 MG 24 hr tablet Take 25 mg by mouth daily. Only on Monday ,Wednesday and Friday 11/08/19  Yes [provider]  milk thistle 175 MG tablet Take 175 mg by mouth 3 (three) times daily.    Yes [provider]  mirtazapine (REMERON) 15 MG tablet Take 15 mg by mouth at bedtime.   Yes [provider]  Multiple Vitamin (MULTIVITAMIN WITH MINERALS) TABS tablet Take 1 tablet by mouth in the morning.    Yes [provider]  omega-3 acid ethyl esters (LOVAZA) 1 g capsule Take 2 g by mouth 2 (two) times daily.  04/07/19  Yes [provider]  omeprazole (PRILOSEC) 40 MG capsule Take 40 mg by mouth in the morning.    Yes [provider]  pravastatin (PRAVACHOL) 20 MG tablet Take 20 mg by mouth in the morning.    Yes [provider]  Probiotic Product (PROBIOTIC DAILY PO) Take 1 tablet by mouth in the morning.    Yes [provider]  rifaximin (XIFAXAN) 550 MG TABS tablet Take 1 tablet (550 mg total) by mouth 2 (two) times daily. 01/13/19  Yes Mahala Menghini, PA-C  spironolactone (ALDACTONE) 50 MG tablet Take 50  mg by mouth in the morning.    Yes [provider]  tamsulosin (FLOMAX) 0.4 MG CAPS capsule Take 0.4 mg by mouth every evening.   Yes [provider]  thiamine 100 MG tablet Take 100 mg by mouth in the morning. Vitamin B-1   Yes [provider]  tiZANidine (ZANAFLEX) 2 MG tablet Take 2 mg by mouth as needed for muscle spasms.    Yes [provider]  triamcinolone cream (KENALOG) 0.1 % Apply 1 application topically 2 (two) times daily as needed (dry skin Itching).  12/30/17  Yes [provider]  venlafaxine (EFFEXOR) 75 MG tablet Take 75 mg by mouth 3 (three) times daily with meals.    Yes [provider]  vitamin B-12 (CYANOCOBALAMIN) 1000 MCG tablet Take 1,000 mcg by mouth daily.   Yes [provider]    Current Facility-Administered Medications  Medication Dose Route Frequency Provider Last Rate Last Admin  . 0.9 %  sodium chloride infusion (Manually program via Guardrails IV Fluids)   Intravenous Once Tat, David, MD      . 0.9 %  sodium chloride infusion  100 mL Intravenous PRN Donato Heinz, MD      . 0.9 %  sodium chloride infusion  100 mL Intravenous PRN Donato Heinz, MD      .  acetaminophen (TYLENOL) tablet 650 mg  650 mg Oral Q6H PRN Tat, David, MD       Or  . acetaminophen (TYLENOL) suppository 650 mg  650 mg Rectal Q6H PRN Tat, Shanon Brow, MD      . alteplase (CATHFLO ACTIVASE) injection 2 mg  2 mg Intracatheter Once PRN Donato Heinz, MD      . ascorbic acid (VITAMIN C) tablet 1,000 mg  1,000 mg Oral BID Tat, Shanon Brow, MD   1,000 mg at 12/14/19 0132  . cefTRIAXone (ROCEPHIN) 1 g in sodium chloride 0.9 % 100 mL IVPB  1 g Intravenous Q24H Orson Eva, MD 200 mL/hr at 12/13/19 1849 1 g at 12/13/19 1849  . Darbepoetin Alfa (ARANESP) injection 60 mcg  60 mcg Intravenous Q Wed-HD Donato Heinz, MD   60 mcg at 12/13/19 2218  . furosemide (LASIX) tablet 80 mg  80 mg Oral q AM Tat, Shanon Brow, MD   80 mg at 12/14/19 4765  .  heparin injection 1,000 Units  1,000 Units Dialysis PRN Donato Heinz, MD      . insulin aspart (novoLOG) injection 0-5 Units  0-5 Units Subcutaneous QHS Tat, David, MD      . insulin aspart (novoLOG) injection 0-9 Units  0-9 Units Subcutaneous TID WC Tat, David, MD      . iron polysaccharides (NIFEREX) capsule 150 mg  150 mg Oral Benay Pike, MD   150 mg at 12/14/19 0132  . lactulose (CHRONULAC) 10 GM/15ML solution 30 g  30 g Oral BID Tat, David, MD   30 g at 12/14/19 0130  . lidocaine (PF) (XYLOCAINE) 1 % injection 5 mL  5 mL Intradermal PRN Donato Heinz, MD      . lidocaine-prilocaine (EMLA) cream 1 application  1 application Topical PRN Donato Heinz, MD      . metoprolol succinate (TOPROL-XL) 24 hr tablet 25 mg  25 mg Oral Q M,W,F Orson Eva, MD   25 mg at 12/13/19 1856  . multivitamin with minerals tablet 1 tablet  1 tablet Oral q AM Orson Eva, MD   1 tablet at 12/14/19 [phone]  . omega-3 acid ethyl esters (LOVAZA) capsule 2 g  2 g Oral BID Tat, Shanon Brow, MD   2 g at 12/14/19 0133  . ondansetron (ZOFRAN) tablet 4 mg  4 mg Oral Q6H PRN Tat, David, MD       Or  . ondansetron [facility]) injection 4 mg  4 mg Intravenous Q6H PRN Orson Eva, MD   4 mg at 12/13/19 1923  . pentafluoroprop-tetrafluoroeth (GEBAUERS) aerosol 1 application  1 application Topical PRN Donato Heinz, MD      . pravastatin (PRAVACHOL) tablet 20 mg  20 mg Oral q AM Orson Eva, MD   20 mg at 12/14/19 3546  . rifaximin (XIFAXAN) tablet 550 mg  550 mg Oral BID Orson Eva, MD   550 mg at 12/14/19 0132  . spironolactone (ALDACTONE) tablet 50 mg  50 mg Oral q AM Orson Eva, MD   50 mg at 12/14/19 5681  . thiamine tablet 100 mg  100 mg Oral q AM Tat, Shanon Brow, MD   100 mg at 12/14/19 2751  . venlafaxine [facility]) tablet 75 mg  75 mg Oral TID WC Tat, David, MD      . vitamin B-12 (CYANOCOBALAMIN) tablet 1,000 mcg  1,000 mcg Oral Daily Tat, David, MD   1,000 mcg at 12/13/19 1856    Allergies as of 12/13/2019 - Review  Complete 12/13/2019  Allergen  Reaction Noted  . Chlorhexidine  09/19/2019  . Tape Itching and Rash 09/18/2019    Past Medical History:  Diagnosis Date  . A-fib [facility])    when initially stating dialysis january 2021 at [facility] per spouse; never heard anything else about it  . Alcoholic cirrhosis ([place])    patient reports completing hep a and b vaccines in 2006  . Anemia    has had 3 units of prbcs 2013  . Anxiety   . Arthritis   . Asymptomatic gallstones    Ultrasound in 2006  . C. difficile colitis   . Cholelithiasis   . Chronic kidney disease    Dialysis M/W/F/Sa  . Depression   . Diabetes ([place])   . Duodenal ulcer with hemorrhage    per patient in 2006 or 2007, records have been requested  . Dyspnea    low oxygen level -  has O2 2 L all day  . 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 GERD (gastroesophageal reflux disease)   . Heart burn   . 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 HOH (hard of hearing)   . Hypertension   . Liver cirrhosis ([place])   . Splenomegaly    Ultrasound in 2006    Past Surgical History:  Procedure Laterality Date  . AGILE CAPSULE N/A 09/20/2019   Procedure: AGILE CAPSULE;  Surgeon: Daneil Dolin, MD;  [facility];  Service: Endoscopy;  Laterality: N/A;  . APPENDECTOMY    . AV FISTULA PLACEMENT Left 08/17/2019   Procedure: ARTERIOVENOUS (AV) FISTULA CREATION VERSES ARTERIOVENOUS GRAFT;  Surgeon: [name], MD;  Location: [place];  Service: Vascular;  Laterality: Left;  . BASCILIC VEIN TRANSPOSITION Left 09/28/2019   Procedure: LEFT SECOND STAGE BASCILIC VEIN TRANSPOSITION;  Surgeon: [name], MD;  Location: [place];  Service: Vascular;  Laterality: Left;  . BIOPSY  02/03/2018   Procedure: BIOPSY;  Surgeon: Daneil Dolin, MD;  [facility];  Service: Endoscopy;;  bx of gastric polyps  . BIOPSY  09/19/2019   Procedure: BIOPSY;  Surgeon: Danie Binder, MD;  [facility];  Service: Endoscopy;;  .  CATARACT EXTRACTION Right   . COLONOSCOPY  03/10/2005   Rectal polyp as described above, removed with snare. Left sided  diverticula. The remainder of the colonic mucosa appeared normal. Inflammed polyp on path.  . COLONOSCOPY  04/1999   Dr. Thea Silversmith polyps removed,  Path showed hyperplastic  . COLONOSCOPY  10/2015   Dr. Britta Mccreedy: diverticulosis, single sessile tubular adenoma 3-66mm in size removed from descending colon.   . COLONOSCOPY WITH ESOPHAGOGASTRODUODENOSCOPY (EGD)  03/31/2012   [id number] AVMs. Colonic diverticulosis. tubular adenoma colon  . COLONOSCOPY WITH ESOPHAGOGASTRODUODENOSCOPY (EGD)  06/2019   FORSYTH: small esophageal varices without high risk stigmata, single large AVM on lesser curvature in gastric body s/p APC ablation, gastric antral and duodenal bulb polyposis. No significant source to explain transfusion dependent anemia. Colonoscopy with portal colopathy and diffuse edema, changes of Cdiff colitis on colonoscopy  . ESOPHAGEAL BANDING N/A 02/03/2018   Procedure: ESOPHAGEAL BANDING;  Surgeon: Daneil Dolin, MD;  [facility];  Service: Endoscopy;  Laterality: N/A;  . ESOPHAGOGASTRODUODENOSCOPY  01/15/2005   Three columns grade 1 to 2 esophageal varices, otherwise normal esophageal mucosa.  Esophagus was not manipulated otherwise./Nodularity of the antrum with overlying erosions, nonspecific finding. Path showed rare H.pylori  . ESOPHAGOGASTRODUODENOSCOPY  09/2008  Dr. Gaylene Brooks columns of grade 2-3 esoph varices, only one column was prominent. Portal gastropathy, multiple gastrc polyps at antrum, two were 2cm with black eschar, bulbar polyps, bulbar erosions  . ESOPHAGOGASTRODUODENOSCOPY  11/2004   Dr. Brantley Stage 3 esoph varices  . ESOPHAGOGASTRODUODENOSCOPY  03/31/2012   RMR: 4 columns(3-GR 2, 1-GR1) non-bleeding esophageal varices, portal gastropathy, small HH, early GAVE, multiple gastric polyps   . ESOPHAGOGASTRODUODENOSCOPY (EGD) WITH  PROPOFOL N/A 02/03/2018   Dr. Gala Romney: Esophageal varices, 3 columns grade 1-2.  Portal hypertensive gastropathy.  Multiple gastric polyps, biopsy consistent with hyperplastic.  [name] ESOPHAGOGASTRODUODENOSCOPY (EGD) WITH PROPOFOL N/A 09/19/2019   Fields: grade I and II esophageal varcies, mild portal hypertensive gastropathy, moderate gastritis but no H. pylori, single hyperplastic gastric polyp removed, obvious source for melena/transfusion dependent anemia not identified, may be due to friable gastric and duodenal mucosa in the setting of portal hypertension  . GASTRIC VARICES BANDING  03/31/2012   Procedure: GASTRIC VARICES BANDING;  Surgeon: Daneil Dolin, MD;  [facility];  Service: Endoscopy;  Laterality: N/A;  . GIVENS CAPSULE STUDY N/A 09/21/2019   Poor study with a lot of debris obstructing much of the view of the first approximate 3 hours out of 6 hours.  No obvious source of bleeding identified.  Sequela of bleeding and old blood seen in the form of"whisps" of blood, blood flecks mostly toward the end of the study.  [name] POLYPECTOMY  09/19/2019   Procedure: POLYPECTOMY;  Surgeon: Danie Binder, MD;  [facility];  Service: Endoscopy;;  . UMBILICAL HERNIA REPAIR  2017   exploratory laparotomy, abdominal washout    Family History  Problem Relation Age of Onset  . Colon cancer Father 58       deceased age 84  . Breast cancer Sister        deceased  . Diabetes Sister   . Stroke Mother   . Healthy Son   . Healthy Daughter   . Lung cancer Neg Hx   . Ovarian cancer Neg Hx     Social History   Socioeconomic History  . Marital status: Married    Spouse name: Not on file  . Number of children: 2  . Years of education: Not on file  . Highest education level: Not on file  Occupational History  . Occupation: RETIRED    Employer: SELF EMPLOYED  Tobacco Use  . Smoking status: Former Smoker    Packs/day: 2.00    Years: 25.00    Pack years: 50.00    Types:  Cigarettes    Quit date: 01/28/1986    Years since quitting: 33.8  . Smokeless tobacco: Never Used  Vaping Use  . Vaping Use: Never used  Substance and Sexual Activity  . Alcohol use: No    Comment: quit in 05/2013  . Drug use: No  . Sexual activity: Not Currently  Other Topics Concern  . Not on file  Social History Narrative   Two step children from second marriage (divorced) who live with him along with some grandchildren.    Social Determinants of Health   Financial Resource Strain:   . Difficulty of Paying Living Expenses:   Food Insecurity:   . Worried About Charity fundraiser in the Last Year:   . Arboriculturist in the Last Year:   Transportation Needs:   . Film/video editor (Medical):   [name] Lack of Transportation (Non-Medical):   Physical Activity:   .  Days of Exercise per Week:   . Minutes of Exercise per Session:   Stress:   . Feeling of Stress :   Social Connections:   . Frequency of Communication with Friends and Family:   . Frequency of Social Gatherings with Friends and Family:   . Attends Religious Services:   . Active Member of Clubs or Organizations:   . Attends Archivist Meetings:   [name] Marital Status:   Intimate Partner Violence:   . Fear of Current or Ex-Partner:   . Emotionally Abused:   [name] Physically Abused:   . Sexually Abused:      ROS: he denies abdominal pain, melena, brbpr. Unreliable due to ongoing encephalopathy       Physical Examination: Vital signs in last 24 hours: Temp:  [97.6 F (36.4 C)-98.4 F (36.9 C)] 97.9 F (36.6 C) (07/15 0550) Pulse Rate:  [51-114] 94 (07/15 0551) Resp:  [10-39] 18 (07/15 0550) BP: (98-178)/(50-94) 130/63 (07/15 0551) SpO2:  [72 %-100 %] 100 % (07/15 0551) Weight:  [117.8 kg-117.9 kg] 117.8 kg (07/14 2050) Last BM Date: 12/12/19  General: Well-nourished, well-developed male in no acute distress. He is alert but lethargic. Is conversant but slow to respond.  Head: Normocephalic,  atraumatic.   Eyes: Conjunctiva pale, no icterus. Mouth: Oropharyngeal mucosa moist and pink , no lesions erythema or exudate. Neck: Supple without thyromegaly, masses, or lymphadenopathy.  Lungs: Clear to auscultation bilaterally.  Heart: Regular rate and rhythm, no murmurs rubs or gallops.  Abdomen: Bowel sounds are normal, nontender, nondistended, no hepatosplenomegaly or masses, no abdominal bruits or    hernia , no rebound or guarding.   Rectal: not performed. He was incontinent and large amount of liquid brown stool present at time of exam.  Extremities: No lower extremity edema, clubbing, deformity.  Neuro: Alert and oriented to person and place. Sluggish in responding. +asterixis   Skin: Warm and dry, no rash or jaundice.   Psych: Alert and cooperative, normal mood and affect.        Intake/Output from previous day: 07/14 0701 - 07/15 0700 In: -  Out: 4319 [Urine:300] Intake/Output this shift: No intake/output data recorded.  Lab Results: CBC Recent Labs    12/13/19 1048 12/13/19 1837 12/14/19 0601  WBC 3.4* 4.0 4.9  HGB 7.1* 7.3* 6.6*  HCT 23.2* 24.0* 22.2*  MCV 95.5 95.6 95.7  PLT 124* 132* 130*   BMET Recent Labs    12/13/19 1048 12/13/19 1837 12/14/19 0601  NA 124* 125* 131*  K 5.7* 5.4* 4.4  CL 87* 89* 95*  CO2 23 24 27   GLUCOSE 96 74 69*  BUN 55* 56* 24*  CREATININE 4.74* 4.91* 2.58*  CALCIUM 9.1 9.0 8.8*   LFT Recent Labs    12/13/19 1048 12/13/19 1837  BILITOT 0.9  --   ALKPHOS 129*  --   AST 22  --   ALT 21  --   PROT 7.1  --   ALBUMIN 3.1* 3.1*    Lipase Recent Labs    12/13/19 1048  LIPASE 24    PT/INR No results for input(s): LABPROT, INR in the last 72 hours.    Imaging Studies: CT Head Wo Contrast  Result Date: 12/13/2019 CLINICAL DATA:  Altered mental status. EXAM: CT HEAD WITHOUT CONTRAST TECHNIQUE: Contiguous axial images were obtained from the base of the skull through the vertex without intravenous contrast.  COMPARISON:  September 08, 2019. FINDINGS: Brain: Mild chronic ischemic white matter disease is noted.  No mass effect or midline shift is noted. Ventricular size is within normal limits. There is no evidence of mass lesion, hemorrhage or acute infarction. Vascular: No hyperdense vessel or unexpected calcification. Skull: Normal. Negative for fracture or focal lesion. Sinuses/Orbits: No acute finding. Other: None. IMPRESSION: Mild chronic ischemic white matter disease. No acute intracranial abnormality seen. Electronically Signed   By: Marijo Conception M.D.   On: 12/13/2019 13:07  [4 week]   Impression: Pleasant 74 y/o male with history of ETOH cirrhosis (remote use) complicated by hepatic encephalopathy, SBP, transfusion dependent anemia, h/o Cdiff presenting with weakness and mental status changes. MELD Na unknown without available PT/INR.   Anemia: requiring transfusion to maintain Hgb. Now being followed by hematology, plans for iron infusions in near future. Patient without overt GI bleeding but high risk for chronic occult GI bleeding. Prior EGDs/colonoscopy/capsule study as outlined above. Doubt we are dealing with variceal bleeding at this time. Of note, patient no longer on non-selective beta blocker since episode of SBP in the last year. He had been on Bactrim DS since 04/2019 but was discontinued in Jan/Feb 2021 admission due to refractory Cdiff. Transfusion of two units prbcs schedule per attending for decline in Hgb to 6.6.  Mental status changes in setting of cirrhosis and missed hemodialysis. Ammonia level was mildly elevated on admission. As outpatient, he had been on xifaxan 550mg  bid only. Lactulose held after Cdiff earlier this year because of persistent loose stools. When seen one month ago in the office, he reported no persistent diarrhea and had come of [place] successfully.  Lactulose restarted this admission. Will monitor stool output. He had hemodialysis yesterday. He is more alert today.  Oriented to person and place but remains sluggish and has difficulty answering questions.    Plan: 1. PT/INR in am.  2. Monitor H/H.  3. No indication for endoscopy at this time without overt GI bleeding.  4. Follow up with hematology as outpatient.  5. We will continue to follow mental status, continue current regimen.    We would like to thank you for the opportunity to participate in the care of Jeffrey [name].  Laureen Ochs. Bernarda Caffey [facility] [phone] 7/15/202110:31 AM    LOS: 1 day

## 2019-12-14 NOTE — Procedures (Signed)
     HEMODIALYSIS TREATMENT NOTE:  Had prolonged epistaxis earlier today requiring packing of left nare.  Primary RN had transfused one unit of pRBCs prior to HD.  An additional unit was transfused with extra/abbreviated HD treatment today.  Treatment time was extended to 3 hours for blood and antibiotic administration and to maximize UF.  Pt c/o nausea, "I swallowed a lot of blood" with earlier epistaxis.  Zofran 4mg  given; relief verbalized.  Order for Albumin 25g prn low BP noted.  BP dropped to 92/52 just as transfusion was started.   UF was paused x 15 minutes and then resumed at lower rate.  BP remained soft but stable at reduced rate so Albumin was held.  Pt was restless throughout HD session.  Room air sats 84%.  Unable to wear nasal cannula.  Saturating 96% on 3L via face mask but pt kept pulling mask off and picking at [employer].  Needs continuous spO2 monitoring and likely safety mitts--primary nurse notified.  Also tried to get OOB repeatedly.  Bed exit alarm was activated.  No change from pre-HD assessment.    Treatment balance: NS  +300 pRBC  +315 IV Abx  +400 UF   -2900 ___________________ NET UF 1800cc   Rockwell Alexandria, RN

## 2019-12-14 NOTE — Consult Note (Signed)
Consultation Note Date: 12/14/2019   Patient Name: Jeffrey [name]  DOB: 07/06/1945  MRN: [id number]  Age / Sex: 74 y.o., male  PCP: Monico Blitz, MD Referring Physician: Orson Eva, MD  Reason for Consultation: Establishing goals of care  HPI/Patient Profile: 74 y.o. male  with past medical history of alcoholic liver cirrhosis (no alcohol x 5 years), ESRD (HD MWF began early 2021), HTN, diabetes, HLD, transfusion dependent anemia, portal hypertensive gastropathy, esophageal varices admitted on 12/13/2019 with confusion with lethargy and weakness since 12/09/19. Last dialysis treatment noted to be 12/08/19. Hepatic encephalopathy ammonia 46 (lactulose reportedly stopped 6 months ago due to C. diff infection). Electrolyte imbalance due to missed dialysis. Plans for blood transfusion and received dialysis overnight. Episodes of hypoglycemia today.   Clinical Assessment and Goals of Care: I met today at Jeffrey [name]'s bedside. He returns my greeting but is still lethargic and repeats things that he has just said. Not always able to answer questions appropriately and unable to participate in [place] conversation with me. He owns a dry cleaning business in [place] and he and his wife have 5 kids in total but none together. Married for 3 years but together for 17 years with Jeffrey [name].   I called and discussed with wife, Jeffrey [name]. Jeffrey [name] is very up to date on her husband's medical conditions and asks good questions. We spent time discussing the complexity of multiorgan failure with liver cirrhosis and end stage renal disease. Discussed that both of these can lead to his current condition especially when dialysis is skipped. Anticipate that he will need to restart lactulose and discussed the importance of completing dialysis as scheduled. Jeffrey [name] understands and tries to encourage her husband to be compliant but this is sometimes a struggle at times. This week  she reports that he became weak and "jerky" - struggling to hold fork as it would just fall out of his hand. He felt bad from this and did not feel well enough to go to dialysis. Unfortunately this only led to worsening condition. We discussed concern that he will struggle to stay compliant with lactulose and dialysis as needed. Jeffrey [name] reports that at baseline he walks with a walker because his legs stay so edematous. Able to walk around home and down ramp to care.   I had a chance to speak with Jeffrey [name] about expected trajectory, expectations, signs of further declining health. She understands that his condition is expected to worsen over time. She reports that Jeffrey [name] has expressed that he wants all measures to keep him alive including resuscitation but would not want to be left alive on machines if he is not improving. I discussed that there may be a time down the road when he is weaker, sicker, and quality of life declines to a point that these measures do not make sense for him and she understands. We also discussed that there may be a time when he physically/medically cannot tolerate dialysis or if quality of life worsens some people even choose to stop dialysis themselves if it  only prolonging their suffering.   Jeffrey [name] does say they have discussed some and seems they had a visit at home from palliative care and completed MOST form that she believes is on file with PCP. All questions/concerns addressed. Emotional support provided. Updated Dr. Carles Collet.   Primary Decision Maker NEXT OF KIN wife Jeffrey [name] (patient is Media planner when ammonia decreases and dialysis up to date)    SUMMARY OF RECOMMENDATIONS   - Recommend outpatient palliative to follow for ongoing conversations  Code Status/Advance Care Planning:  Full code   Symptom Management:   Per attending, renal, GI  Palliative Prophylaxis:   Aspiration, Bowel Regimen, Delirium Protocol, Frequent Pain Assessment, Oral Care and Turn  Reposition  Additional Recommendations (Limitations, Scope, Preferences):  Full Scope Treatment  Psycho-social/Spiritual:   Desire for further Chaplaincy support:no  Prognosis:   Overall prognosis poor with hepatorenal failure.   Discharge Planning: To Be Determined      Primary Diagnoses: Present on Admission: . Hepatic encephalopathy ([place]) . Cirrhosis ([place]) . Anemia due to chronic blood loss . Alcoholic cirrhosis of liver with ascites ([place])   I have reviewed the medical record, interviewed the patient and family, and examined the patient. The following aspects are pertinent.  Past Medical History:  Diagnosis Date  . A-fib [facility])    when initially stating dialysis january 2021 at [facility] per spouse; never heard anything else about it  . Alcoholic cirrhosis ([place])    patient reports completing hep a and b vaccines in 2006  . Anemia    has had 3 units of prbcs 2013  . Anxiety   . Arthritis   . Asymptomatic gallstones    Ultrasound in 2006  . C. difficile colitis   . Cholelithiasis   . Chronic kidney disease    Dialysis M/W/F/Sa  . Depression   . Diabetes ([place])   . Duodenal ulcer with hemorrhage    per patient in 2006 or 2007, records have been requested  . Dyspnea    low oxygen level -  has O2 2 L all day  . 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 GERD (gastroesophageal reflux disease)   . Heart burn   . 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 HOH (hard of hearing)   . Hypertension   . Liver cirrhosis ([place])   . Splenomegaly    Ultrasound in 2006   Social History   Socioeconomic History  . Marital status: Married    Spouse name: Not on file  . Number of children: 2  . Years of education: Not on file  . Highest education level: Not on file  Occupational History  . Occupation: RETIRED    Employer: SELF EMPLOYED  Tobacco Use  . Smoking status: Former Smoker    Packs/day: 2.00    Years: 25.00    Pack years: 50.00    Types: Cigarettes     Quit date: 01/28/1986    Years since quitting: 33.8  . Smokeless tobacco: Never Used  Vaping Use  . Vaping Use: Never used  Substance and Sexual Activity  . Alcohol use: No    Comment: quit in 05/2013  . Drug use: No  . Sexual activity: Not Currently  Other Topics Concern  . Not on file  Social History Narrative   Two step children from second marriage (divorced) who live with him along with some grandchildren.    Social Determinants of Health   Financial Resource Strain:   .  Difficulty of Paying Living Expenses:   Food Insecurity:   . Worried About Programme researcher, broadcasting/film/video in the Last Year:   . Barista in the Last Year:   Transportation Needs:   . Freight forwarder (Medical):   [name] Lack of Transportation (Non-Medical):   Physical Activity:   . Days of Exercise per Week:   . Minutes of Exercise per Session:   Stress:   . Feeling of Stress :   Social Connections:   . Frequency of Communication with Friends and Family:   . Frequency of Social Gatherings with Friends and Family:   . Attends Religious Services:   . Active Member of Clubs or Organizations:   . Attends Banker Meetings:   [name] Marital Status:    Family History  Problem Relation Age of Onset  . Colon cancer Father 50       deceased age 48  . Breast cancer Sister        deceased  . Diabetes Sister   . Stroke Mother   . Healthy Son   . Healthy Daughter   . Lung cancer Neg Hx   . Ovarian cancer Neg Hx    Scheduled Meds: . sodium chloride   Intravenous Once  . vitamin C  1,000 mg Oral BID  . darbepoetin (ARANESP) injection - DIALYSIS  60 mcg Intravenous Q Wed-HD  . furosemide  80 mg Oral q AM  . insulin aspart  0-5 Units Subcutaneous QHS  . insulin aspart  0-9 Units Subcutaneous TID WC  . iron polysaccharides  150 mg Oral QHS  . lactulose  30 g Oral BID  . metoprolol succinate  25 mg Oral Q M,W,F  . multivitamin with minerals  1 tablet Oral q AM  . omega-3 acid ethyl esters  2 g  Oral BID  . pravastatin  20 mg Oral q AM  . rifaximin  550 mg Oral BID  . spironolactone  50 mg Oral q AM  . thiamine  100 mg Oral q AM  . venlafaxine  75 mg Oral TID WC  . vitamin B-12  1,000 mcg Oral Daily   Continuous Infusions: . sodium chloride    . sodium chloride    . cefTRIAXone (ROCEPHIN)  IV 1 g (12/13/19 1849)   PRN Meds:.sodium chloride, sodium chloride, acetaminophen **OR** acetaminophen, alteplase, heparin, lidocaine (PF), lidocaine-prilocaine, ondansetron **OR** ondansetron (ZOFRAN) IV, pentafluoroprop-tetrafluoroeth Allergies  Allergen Reactions  . Chlorhexidine   . Tape Itching and Rash   Review of Systems  Unable to perform ROS: Acuity of condition    Physical Exam Vitals and nursing note reviewed.  Constitutional:      General: He is not in acute distress.    Appearance: He is morbidly obese. He is ill-appearing.  Cardiovascular:     Rate and Rhythm: Normal rate.  Pulmonary:     Effort: No tachypnea, accessory muscle usage or respiratory distress.  Abdominal:     Palpations: Abdomen is soft.  Neurological:     Mental Status: He is lethargic.     Comments: Oriented to person and place only     Vital Signs: BP 130/63   Pulse 94   Temp 97.9 F (36.6 C) (Oral)   Resp 18   Ht 5\' 7"  (1.702 m)   Wt 117.8 kg   SpO2 100%   BMI 40.68 kg/m  Pain Scale: 0-10   Pain Score: 0-No pain   SpO2: SpO2: 100 % O2 Device:SpO2:  100 % O2 Flow Rate: .O2 Flow Rate (L/min): 4 L/min  IO: Intake/output summary:   Intake/Output Summary (Last 24 hours) at 12/14/2019 0757 Last data filed at 12/14/2019 0600 Gross per 24 hour  Intake --  Output 4319 ml  Net -4319 ml    LBM: Last BM Date: 12/12/19 Baseline Weight: Weight: 117.9 kg Most recent weight: Weight: 117.8 kg     Palliative Assessment/Data:     Time In: 1330 Time Out: 1430 Time Total: 60 min Greater than 50%  of this time was spent counseling and coordinating care related to the above assessment  and plan.  Signed by: Vinie Sill, NP Palliative Medicine Team Pager # [phone] (M-F 8a-5p) Team Phone # [phone] (Nights/Weekends)

## 2019-12-14 NOTE — Progress Notes (Signed)
[facility] Progress Note  Name: Jeffrey [name] MRN: [id number] DOB: 1946-02-12  Chief Complaint:  Weakness, AMS  Subjective:  had HD on 7/14 with 4 kg UF.  He just had a large BM in the bed - he is not able to offer much additional hx.  Primary team has ordered blood.  States he's on oxygen at home but can't tell me how much  Review of systems:  Unable to obtain 2/2 AMS -------- Background on consult:   Jeffrey [name] is a 74 y.o. 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  He did not go to dialysis this week for unclear reasons.  Apparently he was seen by urology yesterday and was alert, awake, and oriented at that visit but has not been feeling well since this morning.  He denies any N/V/D/fevers/cough/sob.  In the ED he was noted to be lethargic but arouseable, but did have asterixis.  CT scan of head was unremarkable.  Sodium of 124, K 5.7, BUN 55, WBC 3.4, Hgb 7.1, plt 124, NH3 46.  He is being admitted for further evaluation of his acute metabolic encephalopathy.  We were consulted to help provide HD during his hospitalization.   Intake/Output Summary (Last 24 hours) at 12/14/2019 0841 Last data filed at 12/14/2019 0600 Gross per 24 hour  Intake -  Output 4319 ml  Net -4319 ml    Vitals:  Vitals:   12/14/19 0100 12/14/19 0144 12/14/19 0550 12/14/19 0551  BP: 128/63 (!) 127/94  130/63  Pulse: 92 100 95 94  Resp: 16 20 18    Temp: 98 F (36.7 C) 98 F (36.7 C) 97.9 F (36.6 C)   TempSrc: Oral Oral Oral   SpO2:  97% 100% 100%  Weight:      Height:         Physical Exam:  General adult male in bed chronically ill appearing HEENT normocephalic atraumatic extraocular movements intact sclera anicteric Neck supple trachea midline Lungs clear but reduced  unlabored at rest; on 4 liters oxygen Heart S1S2 no rub Abdomen soft mildly tender distended Extremities trace lower extremity edema  Neuro oriented to person not year and cannot provide much history or answer questions Access: LUE AVF with bruit and thrill; RIJ tunneled catheter  Medications reviewed   Labs:  BMP Latest Ref Rng & Units 12/14/2019 12/13/2019 12/13/2019  Glucose 70 - 99 mg/dL 69(L) 74 96  BUN 8 - 23 mg/dL 24(H) 56(H) 55(H)  Creatinine 0.61 - 1.24 mg/dL 2.58(H) 4.91(H) 4.74(H)  BUN/Creat Ratio 6 - 22 (calc) - - -  Sodium 135 - 145 mmol/L 131(L) 125(L) 124(L)  Potassium 3.5 - 5.1 mmol/L 4.4 5.4(H) 5.7(H)  Chloride 98 - 111 mmol/L 95(L) 89(L) 87(L)  CO2 22 - 32 mmol/L 27 24 23   Calcium 8.9 - 10.3 mg/dL 8.8(L) 9.0 9.1   Dialysis Orders: Center: [facility]  on MWF . EDW 109 kg HD Bath 2K/2.5Ca  Time 4 hours Heparin none. Access LUE AVF (new using 17 gauge needles) BFR 350 DFR 600    Epogen 10,000   Units IV/HD  Venofer  100 mg IV q tx for 10 doses    Assessment/Plan:   1.  Acute metabolic encephalopathy- unclear etiology, however likely combination of hepatic encephalopathy and  ESRD without HD for 5 days.  Need to r/o UTI given chronic indwelling foley catheter. Culture pending and abx per primary team.  Denies any Etoh use. 2.  ESRD -  HD per MWF schedule and PRN (missed several days).  Noted on daily lasix and spironolactone.  Given presentation with hyperkalemia will discontinue spironolactone.  Plan for abbreviated treatment today, 7/15 to optimize volume 3.  Hypertension/volume - optimize volume with HD  4.  Anemia - follow H/H. Transfusions per primary team - note plans for 2 units PRBC's.  Had aranesp 60 mcg on 7/14 and increased next Wed dose to 150 mcg for now with profound anemia  5.  Metabolic bone disease -  note that he had a low sodium diet ordered - have changed to renal diet.  hyperphos - continue binders when known - none on med list? 6.  Nutrition -  Renal  diet, carb modified when able to take po 7. Cirrhosis- now with AMS and elevated NH3 8. Chronic GIB- follow H/H and transfuse prn. 9. Vascular access- note has tunneled catheter and new AVF - given K and BUN his catheter was used for better clearance   Jeffrey Desanctis, MD 12/14/2019 9:02 AM

## 2019-12-14 NOTE — Progress Notes (Addendum)
Responded to nursing call:  epistaxis   Subjective: Patient states he has been "picking [his] nose" Denies headache, visual disturbance, dizziness, sob  Vitals:   12/14/19 0551 12/14/19 1146 12/14/19 1200 12/14/19 1238  BP: 130/63 (!) 142/63 (!) 142/63 (!) 143/56  Pulse: 94 99 99 (!) 102  Resp:  15 15 16   Temp:  98.7 F (37.1 C) 98.7 F (37.1 C) 97.9 F (36.6 C)  TempSrc:  Oral Oral   SpO2: 100% 100% 100% 100%  Weight:      Height:       CV--RRR Lung--CTA Nares--bleeding primarily left nare anteriorly   Assessment/Plan: epixtaxis -instructed patient to bend at waist and pinch nares -Afrin intranasal -I personally pinched his nares and held pressure x ~10 min -no further anterior bleeding noted -instructed patient to cease digital manipulation of nares     Orson Eva, DO [facility]

## 2019-12-14 NOTE — Progress Notes (Addendum)
Rhino Rocket placed with TXA and achieved hemostasis of epistaxis -start cefazolin on HD for prophylaxis against Staph -spoke with ENT, Dr. Wynn Maudlin patient/family to arrange for office appointment for Monday/Tuesday next week for Rhino Rocket to be removed and for rhinoscopy  I spoke with patient's spouse and updated her on events of this afternoon and made her aware of Dr. Deeann Saint office contact information.  Total time for all the encounters this afternoon 90 min  DTat

## 2019-12-14 NOTE — ED Provider Notes (Addendum)
Asked to evaluate patient in room 324 for concern of continued left-sided epistaxis. Physical Exam  BP (!) 143/56 (BP Location: Left Leg)   Pulse (!) 102   Temp 97.9 F (36.6 C)   Resp 16   Ht 5\' 7"  (1.702 m)   Wt 117.8 kg   SpO2 100%   BMI 40.68 kg/m   Physical Exam Constitutional:      General: He is not in acute distress.    Appearance: He is well-developed. He is obese. He is not diaphoretic.  HENT:     Head: Normocephalic and atraumatic.     Right Ear: External ear normal.     Left Ear: External ear normal.     Nose:     Right Nostril: No epistaxis.     Left Nostril: Epistaxis present.     Comments: Unable to clearly identify any anterior left-sided epistaxis, bleeding may be somewhat posterior.  Patient does have scars present to the nasal septum.    Mouth/Throat:     Mouth: Mucous membranes are moist.     Pharynx: Oropharynx is clear.  Eyes:     General: Vision grossly intact. Gaze aligned appropriately.     Extraocular Movements: Extraocular movements intact.     Pupils: Pupils are equal, round, and reactive to light.  Neck:     Trachea: Trachea and phonation normal.  Pulmonary:     Effort: Pulmonary effort is normal. No respiratory distress.  Musculoskeletal:        General: Normal range of motion.     Cervical back: Normal range of motion.  Skin:    General: Skin is warm and dry.  Neurological:     Mental Status: He is alert.     GCS: GCS eye subscore is 4. GCS verbal subscore is 5. GCS motor subscore is 6.     Comments: Speech is clear and goal oriented, follows commands Major Cranial nerves without deficit, no facial droop Moves extremities without ataxia, coordination intact  Psychiatric:        Behavior: Behavior normal.     ED Course/Procedures     .Epistaxis Management  Date/Time: 12/14/2019 5:27 PM Performed by: Deliah Boston, PA-C Authorized by: Deliah Boston, PA-C   Consent:    Consent obtained:  Verbal   Consent given by:   Patient   Risks discussed:  Bleeding, infection, nasal injury and pain Procedure details:    Treatment site:  Unable to specify (Left)   Repair method: Anterior/Posterior RhinoRocket with TXA.   Treatment complexity:  Limited Post-procedure details:    Assessment:  Bleeding stopped   Patient tolerance of procedure:  Tolerated well, no immediate complications    MDM  Left-sided epistaxis successfully managed as above.  I monitored the patient for around 10 minutes after inserting the [employer] and he had no continued bleeding.  Oropharynx was reexamined without bleeding down the back of the throat.  He tolerated the procedure well.  Message sent to Dr. Carles Collet regarding procedure, discussed that bacitracin ointment not used, instead TXA was necessary given amount of bleeding.  Discussed that prophylactic antibiotics may be needed.  Note: Portions of this report may have been transcribed using voice recognition software. Every effort was made to ensure accuracy; however, inadvertent computerized transcription errors may still be present.    Deliah Boston, PA-C 12/14/19 1740    Gari Crown 12/14/19 1741    Fredia Sorrow, MD 12/15/19 [phone]

## 2019-12-14 NOTE — Progress Notes (Signed)
CRITICAL VALUE ALERT  Critical Value:  Hgb 6.6  Date & Time Notied:  12/14/2019 0737  Provider Notified: Dr. Carles Collet  Orders Received/Actions taken: awaiting/orders call back

## 2019-12-14 NOTE — Progress Notes (Signed)
Pharmacy Antibiotic Note  Jeffrey [name] is a 74 y.o. male admitted on 7/14/2021with medical history of alcoholic liver cirrhosis, ESRD (MWF), hypertension, diabetes mellitus type 2, hyperlipidemia, transfusion dependent anemia, portal hypertensive gastropathy, esophageal varices presenting with lethargy, generalized weakness, and confusion since 12/09/2019 that had a Rhino Rocket placed today to achieve hemostasis of epistaxis. Pharmacy has been consulted for vancomycin surgical prophylaxis dosing.  Plan: Vancomycin 1500 mg x 1 dose (based on pt > 100 kg and reduced renal function)   Height: 5\' 7"  (170.2 cm) Weight: 118.2 kg (260 lb 9.3 oz) IBW/kg (Calculated) : 66.1  Temp (24hrs), Avg:98.2 F (36.8 C), Min:97.9 F (36.6 C), Max:98.7 F (37.1 C)  Recent Labs  Lab 12/13/19 1048 12/13/19 1837 12/14/19 0601  WBC 3.4* 4.0 4.9  CREATININE 4.74* 4.91* 2.58*    Estimated Creatinine Clearance: 31.8 mL/min (A) (by C-G formula based on SCr of 2.58 mg/dL (H)).    Allergies  Allergen Reactions  . Chlorhexidine   . Tape Itching and Rash    Antimicrobials this admission: Ceftriaxone 1 gm IV q 24 hr.  Thank you for allowing pharmacy to be a part of this patient's care.  Blenda Nicely 12/14/2019 6:42 PM

## 2019-12-15 DIAGNOSIS — D500 Iron deficiency anemia secondary to blood loss (chronic): Secondary | ICD-10-CM

## 2019-12-15 DIAGNOSIS — G9341 Metabolic encephalopathy: Secondary | ICD-10-CM

## 2019-12-15 DIAGNOSIS — K7031 Alcoholic cirrhosis of liver with ascites: Secondary | ICD-10-CM

## 2019-12-15 DIAGNOSIS — R040 Epistaxis: Secondary | ICD-10-CM

## 2019-12-15 DIAGNOSIS — G9340 Encephalopathy, unspecified: Secondary | ICD-10-CM

## 2019-12-15 DIAGNOSIS — K7290 Hepatic failure, unspecified without coma: Principal | ICD-10-CM

## 2019-12-15 LAB — COMPREHENSIVE METABOLIC PANEL
ALT: 23 U/L (ref 0–44)
AST: 26 U/L (ref 15–41)
Albumin: 2.6 g/dL — ABNORMAL LOW (ref 3.5–5.0)
Alkaline Phosphatase: 128 U/L — ABNORMAL HIGH (ref 38–126)
BUN: 23 mg/dL (ref 8–23)
CO2: 27 mmol/L (ref 22–32)
Calcium: 9 mg/dL (ref 8.9–10.3)
Chloride: 88 mmol/L — ABNORMAL LOW (ref 98–111)
Creatinine, Ser: 2.27 mg/dL — ABNORMAL HIGH (ref 0.61–1.24)
GFR calc Af Amer: 32 mL/min — ABNORMAL LOW (ref >60)
GFR calc non Af Amer: 28 mL/min — ABNORMAL LOW (ref >60)
Glucose, Bld: 172 mg/dL — ABNORMAL HIGH (ref 70–99)
Potassium: 3.6 mmol/L (ref 3.5–5.1)
Sodium: 139 mmol/L (ref 135–145)
Total Bilirubin: 1.2 mg/dL (ref 0.3–1.2)
Total Protein: 6.1 g/dL — ABNORMAL LOW (ref 6.5–8.1)

## 2019-12-15 LAB — PHOSPHORUS: Phosphorus: 3.7 mg/dL (ref 2.5–4.6)

## 2019-12-15 LAB — PREPARE RBC (CROSSMATCH)

## 2019-12-15 LAB — AMMONIA: Ammonia: 27 umol/L (ref 9–35)

## 2019-12-15 LAB — CBC
HCT: 24 % — ABNORMAL LOW (ref 39.0–52.0)
Hemoglobin: 7.3 g/dL — ABNORMAL LOW (ref 13.0–17.0)
MCH: 29 pg (ref 26.0–34.0)
MCHC: 30.4 g/dL (ref 30.0–36.0)
MCV: 95.2 fL (ref 80.0–100.0)
Platelets: 119 10*3/uL — ABNORMAL LOW (ref 150–400)
RBC: 2.52 MIL/uL — ABNORMAL LOW (ref 4.22–5.81)
RDW: 17 % — ABNORMAL HIGH (ref 11.5–15.5)
WBC: 3.1 10*3/uL — ABNORMAL LOW (ref 4.0–10.5)
nRBC: 0 % (ref 0.0–0.2)

## 2019-12-15 LAB — GLUCOSE, CAPILLARY
Glucose-Capillary: 165 mg/dL — ABNORMAL HIGH (ref 70–99)
Glucose-Capillary: 224 mg/dL — ABNORMAL HIGH (ref 70–99)
Glucose-Capillary: 178 mg/dL — ABNORMAL HIGH (ref 70–99)
Glucose-Capillary: 229 mg/dL — ABNORMAL HIGH (ref 70–99)

## 2019-12-15 LAB — PROTIME-INR
INR: 1.3 — ABNORMAL HIGH (ref 0.8–1.2)
Prothrombin Time: 15.3 seconds — ABNORMAL HIGH (ref 11.4–15.2)

## 2019-12-15 MED ORDER — SODIUM CHLORIDE 0.9% IV SOLUTION: Freq: Once | INTRAVENOUS | Status: AC

## 2019-12-15 NOTE — Plan of Care (Signed)

## 2019-12-15 NOTE — Progress Notes (Signed)
Subjective: Denies abdominal pain, N/V. Nose bleeding much better. Oriented x 2 (person and year; initially thought he was in [place] at [facility]); he was reoriented. Pleasently conversational, much more awake.  Objective: Vital signs in last 24 hours: Temp:  [97.9 F (36.6 C)-98.7 F (37.1 C)] 98.4 F (36.9 C) (07/15 1915) Pulse Rate:  [99-111] 102 (07/15 2045) Resp:  [15-18] 18 (07/15 1915) BP: (92-146)/(50-63) 109/54 (07/15 2045) SpO2:  [84 %-100 %] 98 % (07/15 1900) Weight:  [935.7 kg] 118.2 kg (07/15 1751) Last BM Date: 12/15/19 General:   Alert and oriented, pleasant Head:  Normocephalic and atraumatic. Rhinorocket remains in place, no obvious persistent oral/nasal bleed noted. Eyes:  No icterus, sclera clear. Conjuctiva pink.  Neck:  Supple, without thyromegaly or masses.  Heart:  S1, S2 present, no murmurs noted.  Lungs: Clear to auscultation bilaterally, without wheezing, rales, or rhonchi.  Abdomen:  Bowel sounds present Abdomen large but soft, non-tender, non-distended. No HSM or hernias noted. No rebound or guarding. No masses appreciated. Minimal to no asterixis noted. Msk:  Symmetrical without gross deformities. Pulses:  Normal bilateral DP pulses noted. Trace LE edema. Extremities:  Without clubbing or edema. Neurologic:  Alert and  oriented x4;  grossly normal neurologically. Skin:  Warm and dry, intact without significant lesions.  Psych:  Alert and cooperative. Normal mood and affect.  Intake/Output from previous day: 07/15 0701 - 07/16 0700 In: 565 [I.V.:250; Blood:315] Out: 900 [Urine:900] Intake/Output this shift: No intake/output data recorded.  Lab Results: Recent Labs    12/13/19 1048 12/13/19 1837 12/14/19 0601  WBC 3.4* 4.0 4.9  HGB 7.1* 7.3* 6.6*  HCT 23.2* 24.0* 22.2*  PLT 124* 132* 130*   BMET Recent Labs    12/13/19 1048 12/13/19 1837 12/14/19 0601  NA 124* 125* 131*  K 5.7* 5.4* 4.4  CL 87* 89* 95*  CO2 23 24 27   GLUCOSE 96  74 69*  BUN 55* 56* 24*  CREATININE 4.74* 4.91* 2.58*  CALCIUM 9.1 9.0 8.8*   LFT Recent Labs    12/13/19 1048 12/13/19 1837  PROT 7.1  --   ALBUMIN 3.1* 3.1*  AST 22  --   ALT 21  --   ALKPHOS 129*  --   BILITOT 0.9  --    PT/INR Recent Labs    12/15/19 0554  LABPROT 15.3*  INR 1.3*   Hepatitis Panel Recent Labs    12/14/19 0601  HEPBSAG NON REACTIVE     Studies/Results: CT Head Wo Contrast  Result Date: 12/13/2019 CLINICAL DATA:  Altered mental status. EXAM: CT HEAD WITHOUT CONTRAST TECHNIQUE: Contiguous axial images were obtained from the base of the skull through the vertex without intravenous contrast. COMPARISON:  September 08, 2019. FINDINGS: Brain: Mild chronic ischemic white matter disease is noted. No mass effect or midline shift is noted. Ventricular size is within normal limits. There is no evidence of mass lesion, hemorrhage or acute infarction. Vascular: No hyperdense vessel or unexpected calcification. Skull: Normal. Negative for fracture or focal lesion. Sinuses/Orbits: No acute finding. Other: None. IMPRESSION: Mild chronic ischemic white matter disease. No acute intracranial abnormality seen. Electronically Signed   By: Marijo Conception M.D.   On: 12/13/2019 13:07    Assessment: Pleasant 74 y/o male with history of ETOH cirrhosis (remote use) complicated by hepatic encephalopathy, SBP, transfusion dependent anemia, h/o Cdiff presenting with weakness and mental status changes. MELD Na unknown on admission without available PT/INR.   Anemia- requiring transfusion to  maintain Hgb. Now being followed by hematology, plans for iron infusions in near future. Patient without overt GI bleeding but high risk for chronic occult GI bleeding. Prior EGDs/colonoscopy/capsule study as outlined in consult note 7/15. Doubt we are dealing with variceal bleeding at this time. Of note, patient no longer on non-selective beta blocker since episode of SBP in the last year. He had been  on Bactrim DS since 04/2019 but was discontinued in Jan/Feb 2021 admission due to refractory Cdiff. Planned transfusion of two units prbcs per attending for decline in Hgb to 6.6; Appears to have received 1 unit thus far (from nursing flowsheets); hgb improved to 7.3 today.  Mental status changes- in setting of cirrhosis and missed hemodialysis. Ammonia level was mildly elevated on admission. As outpatient, he had been on xifaxan 550mg  bid only. Lactulose held after Cdiff earlier this year because of persistent loose stools. When seen one month ago in the office, he reported no persistent diarrhea and had come of [place] successfully.  Lactulose restarted this admission with 3 liquid stools in the past 24 hours. He had hemodialysis yesterday. This morning Ammonia normalized on lactulose (27 from 96 yesterday). He is much more alert today. Oriented to person and place but thought he was in [place] at [facility]; pleasently conversational. Minimal to no asterixis.   Epistaxis- Patient was "picking his nose" last night and had epistaxis. Failed manual pressure and Afrin application. ED PA placed Rhinorocket for hemostasis. No persistent bleeding noted, Rhinorocket remains in place.  Decompensated cirrhosis- This morning MELD-Na: 23, Child-Pugh: B (8). On HD for ESRD. Saw palliative care yesterday (also phone consulted with wife) and indicate they want all measures to keep him alive, including resuscitation but NO prolonged life support if no improvement. MOST form previously signed at home with home palliative cvonsult (on file with PCP). See palliative medicine consult for more details.   Plan: 1. Continue Xifaxan and lactulose 2. Monitor hgb, no indication for endoscopy 3. Follow for H/H after nasal bleeding 4. Outpatient hematology follow-up 5. Monitor for worsening mental status 6. Will likely need Xifaxan and lactulose as an outpatient 7. Supportive measures   Thank you for allowing [place] to  participate in the care of Jeffrey T Ephriam Jenkins, DNP, AGNP-C Adult & Gerontological Nurse Practitioner [facility]    LOS: 2 days    12/15/2019, 8:25 AM

## 2019-12-15 NOTE — Progress Notes (Signed)
[facility] Progress Note  Name: Jeffrey [name] MRN: [id number] DOB: November 26, 1945  Chief Complaint:  Weakness, AMS  Subjective:  He had additional HD on 7/15 for additional UF and to optimize with need for transfusion.  His UF wasn't charted.  Got 2 units of blood as well as albumin for hypotension.  Note the HD rn reported pt with agitation and trying to get out of bed.  Also with epistaxis yesterday.  He had his nasal cannula around his neck when I entered - was set for 6 liters.  Per Rn he drops to low 80's on room air.  Says feels less swollen  Review of systems:  Limited 2/2 AMS though denies shortness of breath or chest pain Denies n/v -------- Background on consult:   Jeffrey [name] is a 74 y.o. 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  He did not go to dialysis this week for unclear reasons.  Apparently he was seen by urology yesterday and was alert, awake, and oriented at that visit but has not been feeling well since this morning.  He denies any N/V/D/fevers/cough/sob.  In the ED he was noted to be lethargic but arouseable, but did have asterixis.  CT scan of head was unremarkable.  Sodium of 124, K 5.7, BUN 55, WBC 3.4, Hgb 7.1, plt 124, NH3 46.  He is being admitted for further evaluation of his acute metabolic encephalopathy.  We were consulted to help provide HD during his hospitalization.   Intake/Output Summary (Last 24 hours) at 12/15/2019 0838 Last data filed at 12/15/2019 0020 Gross per 24 hour  Intake 565 ml  Output 900 ml  Net -335 ml    Vitals:  Vitals:   12/14/19 2000 12/14/19 2015 12/14/19 2030 12/14/19 2045  BP: (!) 119/57 (!) 120/50 (!) 118/53 (!) 109/54  Pulse: (!) 107 (!) 104 100 (!) 102  Resp:      Temp:      TempSrc:      SpO2:       Weight:      Height:         Physical Exam:   General adult male in bed chronically ill appearing HEENT normocephalic atraumatic extraocular movements intact sclera anicteric Neck supple trachea midline Lungs clear but reduced unlabored at rest; on no oxygen but 6 liters oxygen running Heart S1S2 no rub Abdomen soft nt but distended - better Extremities trace lower extremity edema  Neuro oriented to person not year and cannot provide much history or answer questions Access: LUE AVF with bruit and thrill; RIJ tunneled catheter  Medications reviewed   Labs:  BMP Latest Ref Rng & Units 12/15/2019 12/14/2019 12/13/2019  Glucose 70 - 99 mg/dL 172(H) 69(L) 74  BUN 8 - 23 mg/dL 23 24(H) 56(H)  Creatinine 0.61 - 1.24 mg/dL 2.27(H) 2.58(H) 4.91(H)  BUN/Creat Ratio 6 - 22 (calc) - - -  Sodium 135 - 145 mmol/L 139 131(L) 125(L)  Potassium 3.5 - 5.1 mmol/L 3.6 4.4 5.4(H)  Chloride 98 - 111 mmol/L 88(L) 95(L) 89(L)  CO2 22 - 32 mmol/L 27 27 24   Calcium 8.9 - 10.3 mg/dL PENDING 8.8(L) 9.0   Dialysis Orders: Center: [facility]  on MWF . EDW 109 kg HD Bath 2K/2.5Ca  Time 4 hours  Heparin none. Access LUE AVF (new using 17 gauge needles) BFR 350 DFR 600    Epogen 10,000   Units IV/HD  Venofer  100 mg IV q tx for 10 doses    Assessment/Plan:   1.  Acute metabolic encephalopathy- unclear etiology, however likely combination of hepatic encephalopathy and ESRD without HD for 5 days.  Need to r/o UTI given chronic indwelling foley catheter. Culture pending and abx per primary team.  Denies any Etoh use. 2.  ESRD -  HD per MWF schedule and PRN.  Noted on daily lasix and spironolactone.  Given presentation with hyperkalemia discontinued spironolactone.  3.  Hypertension/volume - optimize volume with HD  4.  Anemia of CKD - follow H/H. Transfusions per primary team  Had aranesp 60 mcg on 7/14 and increased next Wed dose to 150 mcg for now.  CBC is still pending  5.  Metabolic bone disease -  note that  he had a low sodium diet ordered - have changed to renal diet.  hyperphos - continue binders when known - none on med list.  Update phos 6.  Nutrition -  Renal diet, carb modified when able to take po 7. Cirrhosis- with hepatic encephalopathy presented with AMS and elevated NH3 8. Chronic GIB- follow H/H and transfuse prn.  9. Vascular access- note has tunneled catheter and new AVF - given K and BUN his catheter was used for better clearance   Claudia Desanctis, MD 12/15/2019 8:57 AM

## 2019-12-15 NOTE — Progress Notes (Signed)
PROGRESS NOTE  Jeffrey [name] XFG:[id number] DOB: 07/07/1945 DOA: 12/13/2019 PCP: Monico Blitz, MD Brief History:  74 y.o.malewith medical history ofalcoholic liver cirrhosis, ESRD(MWF), hypertension, diabetes mellitus type 2, hyperlipidemia, transfusion dependent anemia, portal hypertensive gastropathy, esophageal varices presenting with lethargy, generalized weakness, and confusion since 12/09/2019. The patient cannot provide any significant history secondary to encephalopathy. Also history is obtained from review of medical record and speaking with the patient's spouse.The patient missed dialysis on 12/11/2019 secondary to a bruise generalized weakness. His last HDwas on 12/08/2019. His only new medication is B12 supplementation. There is not been any fevers, chills, chest pain, worsening shortness of breath, nausea, vomiting, diarrhea, abdominal pain, dysuria, hematuria. The patient did have changed by urology on 12/12/2019. His wife has noted the patient to have more "jerking type movements"of his upper extremities for the last 2 to 3 days. At baseline, patient is alert and oriented x3 and uses a walker. In the emergency department, the patient was afebrile hemodynamically stable with oxygen saturation 99% on 3 L. Sodium was 124, potassium 5.7, serum creatinine 1.74. LFTs were unremarkable. WBC 3.4, hemoglobin 7.1, platelets 125,000. Nephrology was consulted to assist with management. The patient was admitted for further evaluation and treatment of his encephalopathy.  Assessment/Plan:  Hepatic encephalopathy -Spouse states that the patient was previously on lactulose, but this was stopped when he developed C. difficile resulting in profuse diarrhea. Has not been on lactulose for 6 months. -Discontinue Questran -Continue lactulose-->6 BMs in last 24 hours -Ammonia level 46>>96>>27 -A.m. ammonia -continue rifaximin -overall much more alert, but occasionally pleasantly  confused -titrate lactulose for 3BMs daily  Acute metabolic encephalopathy -Secondary to elevated ammonia and UTI -continue ceftriaxone (started 7/14) -Obtained urine culture-->100K GNR -Holding hypnotic medications including Remeron, gabapentin, Zanaflex, Vistaril  CAUTI - UA with 21-50 WBC -Continue empiric ceftriaxone pending culture data -7/14 urine culture = GNR  Epistaxis with ABLA -12/14/19--large amount, difficult to control -12/14/19--nasal tampon inserted with achieved hemostasis -12/14/19--discussed with ENT, Dr. Vara Guardian appointment on 12/19/19 at 1PM to remove tampon and perform rhinoscopy -leave nasal tampon in place until he sees Dr. Benjamine Mola -transfuse one addition unit PRBC (3 total for admission) -vanco IV on HD for TSS pro  Transfusion dependent anemia/Chronic Blood loss anemia -Felt to be multifactorial including anemia of CKD, component of iron deficiency, and chronic blood loss anemia -Patient has had extensive GI work-up -09/19/2019 EGD--grade 1/2 varices, mild portal hypertensive gastropathy, moderate gastritis, single gastric polyp. There is no obvious source of melena or GI bleeding. It was felt that this blood loss may be due to friable gastric and duodenal mucosa in the setting of portal hypertension -Giv[place] Capsule 09/21/19 unrevealing -Patient follows Dr. 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  Acute on Chronic respiratory failure with hypoxia -chronically on 2L [place] at home -now stable on 3L nasal tampon  Diabetes mellitus type 2 -09/18/19  A1C--5.4 -holding long acting insulin -discontinue novolog sliding scale -having hypoglycemia due to poor po intake  Hyperlipidemia -continue statin and lovaza  Hyperkalemia -remain on tele -HD per renal -improved with HD -spironolactone d/ced by renal  Essential HTN -Continue metoprolol succinate   Hyponatremia -likely due to missing HD and poor solute intake and liver cirrhosis -improving with HD     Status is: Inpatient  Remains inpatient appropriate because:Altered mental status   Dispo: The patient is from: Home  Anticipated d/c is to: Home  Anticipated d/c date is: 7/17 if mental status improves and Hgb stable  Patient currently is not medically stable to d/c.        Family Communication:   Spouse updated 7/16  Consultants:  Renal, GI, palliative  Code Status:  FULL   DVT Prophylaxis:  SCDs   Procedures: As Listed in Progress Note Above  Antibiotics: Ceftriaxone 7/14>>   Subjective: No more epistaxis today.  More alert and following commands.  Denies headache, visual disturbance, cp, sob, n/v.  Having numerous loose BMs without blood.  Denies abd pain or dysuria  Objective: Vitals:   12/14/19 2045 12/14/19 2100 12/15/19 1128 12/15/19 1401  BP: (!) 109/54 (!) 104/53 (!) 156/66 (!) 144/59  Pulse: (!) 102 100 (!) 101 96  Resp:  16 20 19   Temp:  98.4 F (36.9 C) 99.3 F (37.4 C) 98.9 F (37.2 C)  TempSrc:  Oral Oral Oral  SpO2:  97% 100% 94%  Weight:  111.5 kg    Height:        Intake/Output Summary (Last 24 hours) at 12/15/2019 1552 Last data filed at 12/15/2019 0900 Gross per 24 hour  Intake 435 ml  Output 2795 ml  Net -2360 ml   Weight change: 0.265 kg Exam:   General:  Pt is alert, follows commands appropriately, not in acute distress  HEENT: No icterus, No thrush, No neck mass, [place]/AT  Cardiovascular: RRR, S1/S2, no rubs, no gallops  Respiratory: bibasilar  crackles. No wheeze  Abdomen: Soft/+BS, non tender, non distended, no guarding  Extremities: No edema, No lymphangitis, No petechiae, No rashes, no synovitis   Data Reviewed: I have personally reviewed following labs and imaging studies Basic Metabolic Panel: Recent Labs  Lab 12/13/19 1048 12/13/19 1837 12/14/19 0601 12/15/19 0554  NA 124* 125* 131* 139  K 5.7* 5.4* 4.4 3.6  CL 87* 89* 95* 88*  CO2 23 24 27 27   GLUCOSE 96 74 69* 172*  BUN 55* 56* 24* 23  CREATININE 4.74* 4.91* 2.58* 2.27*  CALCIUM 9.1 9.0 8.8* 9.0  PHOS  --  7.2*  --  3.7   Liver Function Tests: Recent Labs  Lab 12/13/19 1048 12/13/19 1837 12/15/19 0554  AST 22  --  26  ALT 21  --  23  ALKPHOS 129*  --  128*  BILITOT 0.9  --  1.2  PROT 7.1  --  6.1*  ALBUMIN 3.1* 3.1* 2.6*   Recent Labs  Lab 12/13/19 1048  LIPASE 24   Recent Labs  Lab 12/13/19 1048 12/14/19 0601 12/15/19 0554  AMMONIA 46* 96* 27   Coagulation Profile: Recent Labs  Lab 12/15/19 0554  INR 1.3*   CBC: Recent Labs  Lab 12/13/19 1048 12/13/19 1837 12/14/19 0601 12/15/19 0554  WBC 3.4* 4.0 4.9 3.1*  NEUTROABS 2.5  --   --   --   HGB 7.1* 7.3* 6.6* 7.3*  HCT 23.2* 24.0* 22.2* 24.0*  MCV 95.5 95.6 95.7 95.2  PLT 124* 132* 130* 119*   Cardiac Enzymes: No results for input(s): CKTOTAL, CKMB, CKMBINDEX, TROPONINI in the last 168 hours. BNP: Invalid input(s): POCBNP CBG: Recent Labs  Lab 12/14/19 1714 12/14/19 2117 12/14/19 2156 12/15/19  3664 12/15/19 1132  GLUCAP 100* 64* 135* 165* 224*   HbA1C: No results for input(s): HGBA1C in the last 72 hours. Urine analysis:    Component Value Date/Time   COLORURINE YELLOW 12/13/2019 1120   APPEARANCEUR HAZY (A) 12/13/2019 1120   LABSPEC 1.010 12/13/2019 1120   PHURINE 5.0 12/13/2019 1120   GLUCOSEU NEGATIVE 12/13/2019 1120   HGBUR NEGATIVE 12/13/2019 1120   BILIRUBINUR NEGATIVE 12/13/2019 1120   [place] 12/13/2019 1120   PROTEINUR NEGATIVE  12/13/2019 1120   NITRITE NEGATIVE 12/13/2019 1120   LEUKOCYTESUR MODERATE (A) 12/13/2019 1120   Sepsis Labs: @LABRCNTIP (procalcitonin:4,lacticidven:4) ) Recent Results (from the past 240 hour(s))  Culture, Urine     Status: Abnormal (Preliminary result)   Collection Time: 12/13/19 11:20 AM   Specimen: Urine, Clean Catch  Result Value Ref Range Status   Specimen Description   Final    URINE, CLEAN CATCH Performed at [facility], [address] [place], [place] [address]    Special Requests   Final    NONE Performed at [facility], [address], [place], [place] [address]    Culture >=100,000 COLONIES/mL GRAM NEGATIVE RODS (A)  Final   Report Status PENDING  Incomplete  SARS Coronavirus 2 by RT PCR (hospital order, performed in [place] hospital lab) Nasopharyngeal Nasopharyngeal Swab     Status: None   Collection Time: 12/13/19 11:36 AM   Specimen: Nasopharyngeal Swab  Result Value Ref Range Status   SARS Coronavirus 2 NEGATIVE NEGATIVE Final    Comment: (NOTE) SARS-CoV-2 target nucleic acids are NOT DETECTED.  The SARS-CoV-2 RNA is generally detectable in upper and lower respiratory specimens during the acute phase of infection. The lowest concentration of SARS-CoV-2 viral copies this assay can detect is 250 copies / mL. A negative result does not preclude SARS-CoV-2 infection and should not be used as the sole basis for treatment or other patient management decisions.  A negative result may occur with improper specimen collection / handling, submission of specimen other than nasopharyngeal swab, presence of viral mutation(s) within the areas targeted by this assay, and inadequate number of viral copies (<250 copies / mL). A negative result must be combined with clinical observations, patient history, and epidemiological information.  Fact Sheet for Patients:   StrictlyIdeas.no  Fact Sheet for Healthcare  Providers: BankingDealers.co.za  This test is not yet approved or  cleared by the [place] FDA and has been authorized for detection and/or diagnosis of SARS-CoV-2 by FDA under an Emergency Use Authorization (EUA).  This EUA will remain in effect (meaning this test can be used) for the duration of the COVID-19 declaration under Section 564(b)(1) of the Act, 21 U.S.C. section 360bbb-3(b)(1), unless the authorization is terminated or revoked sooner.  Performed at [facility], [address] [place], [place] [address]   MRSA PCR Screening     Status: None   Collection Time: 12/13/19  8:45 PM   Specimen: Nasopharyngeal  Result Value Ref Range Status   MRSA by PCR NEGATIVE NEGATIVE Final    Comment:        The GeneXpert MRSA Assay (FDA approved for NASAL specimens only), is one component of a comprehensive MRSA colonization surveillance program. It is not intended to diagnose MRSA infection nor to guide or monitor treatment for MRSA infections. Performed at [facility], [address] [place], [place] [address]      Scheduled Meds:  sodium chloride   Intravenous Once   vitamin C  1,000 mg  Oral BID   [START ON 12/20/2019] darbepoetin (ARANESP) injection - DIALYSIS  150 mcg Intravenous Q Wed-HD   furosemide  80 mg Oral q AM   iron polysaccharides  150 mg Oral QHS   lactulose  30 g Oral BID   metoprolol succinate  25 mg Oral Q M,W,F   multivitamin with minerals  1 tablet Oral q AM   omega-3 acid ethyl esters  2 g Oral BID   pravastatin  20 mg Oral q AM   rifaximin  550 mg Oral BID   silver nitrate applicators  1 application Topical Once   thiamine  100 mg Oral q AM   venlafaxine  75 mg Oral TID WC   vitamin B-12  1,000 mcg Oral Daily   Continuous Infusions:  sodium chloride     sodium chloride     albumin human Stopped (12/14/19 1930)   cefTRIAXone (ROCEPHIN)  IV 1 g (12/14/19 2030)    Procedures/Studies: CT Head Wo  Contrast  Result Date: 12/13/2019 CLINICAL DATA:  Altered mental status. EXAM: CT HEAD WITHOUT CONTRAST TECHNIQUE: Contiguous axial images were obtained from the base of the skull through the vertex without intravenous contrast. COMPARISON:  September 08, 2019. FINDINGS: Brain: Mild chronic ischemic white matter disease is noted. No mass effect or midline shift is noted. Ventricular size is within normal limits. There is no evidence of mass lesion, hemorrhage or acute infarction. Vascular: No hyperdense vessel or unexpected calcification. Skull: Normal. Negative for fracture or focal lesion. Sinuses/Orbits: No acute finding. Other: None. IMPRESSION: Mild chronic ischemic white matter disease. No acute intracranial abnormality seen. Electronically Signed   By: Marijo Conception M.D.   On: 12/13/2019 13:07    Orson Eva, DO  [facility]  If 7PM-7AM, please contact night-coverage [email or web address] Password [email or web address] 12/15/2019, 3:52 PM   LOS: 2 days

## 2019-12-15 NOTE — Progress Notes (Addendum)
      NEPHROLOGY NURSING NOTE:  Per Dr. Royce Macadamia, we will let Mr. [place] rest from HD today.  Will dialyze him tomorrow then resume MWF schedule next week.  prBC transfusion  deferred to primary RN after d/w Dr. Carles Collet.  [place], RN

## 2019-12-16 DIAGNOSIS — K7030 Alcoholic cirrhosis of liver without ascites: Secondary | ICD-10-CM

## 2019-12-16 LAB — TYPE AND SCREEN
ABO/RH(D): O POS
Antibody Screen: NEGATIVE
Unit division: 0
Unit division: 0
Unit division: 0
Unit division: 0

## 2019-12-16 LAB — RENAL FUNCTION PANEL
Albumin: 2.7 g/dL — ABNORMAL LOW (ref 3.5–5.0)
Anion gap: 10 (ref 5–15)
BUN: 34 mg/dL — ABNORMAL HIGH (ref 8–23)
CO2: 28 mmol/L (ref 22–32)
Calcium: 9 mg/dL (ref 8.9–10.3)
Chloride: 95 mmol/L — ABNORMAL LOW (ref 98–111)
Creatinine, Ser: 3.21 mg/dL — ABNORMAL HIGH (ref 0.61–1.24)
GFR calc Af Amer: 21 mL/min — ABNORMAL LOW (ref >60)
GFR calc non Af Amer: 18 mL/min — ABNORMAL LOW (ref >60)
Glucose, Bld: 174 mg/dL — ABNORMAL HIGH (ref 70–99)
Phosphorus: 4.2 mg/dL (ref 2.5–4.6)
Potassium: 3.7 mmol/L (ref 3.5–5.1)
Sodium: 133 mmol/L — ABNORMAL LOW (ref 135–145)

## 2019-12-16 LAB — CBC
HCT: 29.1 % — ABNORMAL LOW (ref 39.0–52.0)
Hemoglobin: 8.8 g/dL — ABNORMAL LOW (ref 13.0–17.0)
MCH: 28.9 pg (ref 26.0–34.0)
MCHC: 30.2 g/dL (ref 30.0–36.0)
MCV: 95.7 fL (ref 80.0–100.0)
Platelets: 127 10*3/uL — ABNORMAL LOW (ref 150–400)
RBC: 3.04 MIL/uL — ABNORMAL LOW (ref 4.22–5.81)
RDW: 16.7 % — ABNORMAL HIGH (ref 11.5–15.5)
WBC: 5.3 10*3/uL (ref 4.0–10.5)
nRBC: 0 % (ref 0.0–0.2)

## 2019-12-16 LAB — BPAM RBC
Blood Product Expiration Date: 202107152359
Blood Product Expiration Date: 202108172359
Blood Product Expiration Date: 202108172359
Blood Product Expiration Date: 202108232359
ISSUE DATE / TIME: 202107151156
ISSUE DATE / TIME: 202107151831
ISSUE DATE / TIME: 202107152233
ISSUE DATE / TIME: 202107161756
Unit Type and Rh: 5100
Unit Type and Rh: 5100
Unit Type and Rh: 5100
Unit Type and Rh: 5100

## 2019-12-16 LAB — HEMOGLOBIN A1C
Hgb A1c MFr Bld: 5.9 % — ABNORMAL HIGH (ref 4.8–5.6)
Mean Plasma Glucose: 122.63 mg/dL

## 2019-12-16 LAB — URINE CULTURE: Culture: >=100000 — AB

## 2019-12-16 LAB — GLUCOSE, CAPILLARY
Glucose-Capillary: 157 mg/dL — ABNORMAL HIGH (ref 70–99)
Glucose-Capillary: 241 mg/dL — ABNORMAL HIGH (ref 70–99)
Glucose-Capillary: 217 mg/dL — ABNORMAL HIGH (ref 70–99)
Glucose-Capillary: 177 mg/dL — ABNORMAL HIGH (ref 70–99)

## 2019-12-16 LAB — AMMONIA: Ammonia: 23 umol/L (ref 9–35)

## 2019-12-16 MED ORDER — CIPROFLOXACIN IN D5W 200 MG/100ML IV SOLN
200.0000 mg | INTRAVENOUS | Status: DC
  Administered 2019-12-16: 200 mg via INTRAVENOUS
  Filled 2019-12-16 (×2): qty 100

## 2019-12-16 MED ORDER — INSULIN ASPART 100 UNIT/ML ~~LOC~~ SOLN
0.0000 [IU] | Freq: Three times a day (TID) | SUBCUTANEOUS | Status: DC
  Administered 2019-12-16 – 2019-12-17 (×2): 3 [IU] via SUBCUTANEOUS
  Administered 2019-12-17: 2 [IU] via SUBCUTANEOUS

## 2019-12-16 MED ORDER — INSULIN ASPART 100 UNIT/ML ~~LOC~~ SOLN: 0.0000 [IU] | Freq: Every day | SUBCUTANEOUS | Status: DC

## 2019-12-16 NOTE — Progress Notes (Signed)
Patient ID: Jeffrey [name], male   DOB: Oct 16, 1945, 74 y.o.   MRN: [id number] [facility] Progress Note   Assessment/ Plan:   1.  Acute metabolic encephalopathy: Suspected to be from a combination of hepatic encephalopathy and severe azotemia/uremia following missed hemodialysis treatments. 2. ESRD: He is usually on a Monday/Wednesday/Friday dialysis schedule and will get hemodialysis today in lieu of yesterday.  No acute electrolyte abnormality seen on lab except for mild hyponatremia. 3. Anemia: Hemoglobin/hematocrit improving on ESA.  No overt loss. 4. CKD-MBD: Calcium and phosphorus level within acceptable range, will continue to monitor on renal diet. 5. Nutrition: Continue low-sodium renal diet with fluid restriction and renal multivitamin. 6. Hypertension: Blood pressures marginally elevated, monitor with ultrafiltration at hemodialysis.  Subjective:   Reports to be feeling better, denies any chest pain or shortness of breath.   Objective:   BP (!) 150/75   Pulse 93   Temp 98.5 F (36.9 C) (Oral)   Resp 20   Ht 5\' 7"  (1.702 m)   Wt 111.5 kg   SpO2 99%   BMI 38.50 kg/m   Physical Exam: Gen: Comfortably resting in bed, on oxygen via nasal cannula CVS: Pulse regular rhythm, normal rate, S1 and S2 normal Resp: Diminished breath sounds over bases, no distinct rales or rhonchi.  Right IJ TDC Abd: Soft, obese, nontender, bowel sounds normal Ext: Trace lower extremity edema.  Left upper arm arteriovenous fistula with bruit.  Labs: BMET Recent Labs  Lab 12/13/19 1048 12/13/19 1837 12/14/19 0601 12/15/19 0554 12/16/19 0618  NA 124* 125* 131* 139 133*  K 5.7* 5.4* 4.4 3.6 3.7  CL 87* 89* 95* 88* 95*  CO2 23 24 27 27 28   GLUCOSE 96 74 69* 172* 174*  BUN 55* 56* 24* 23 34*  CREATININE 4.74* 4.91* 2.58* 2.27* 3.21*  CALCIUM 9.1 9.0 8.8* 9.0 9.0  PHOS  --  7.2*  --  3.7 4.2   CBC Recent Labs  Lab 12/13/19 1048 12/13/19 1048 12/13/19 1837 12/14/19 0601  12/15/19 0554 12/16/19 0618  WBC 3.4*   < > 4.0 4.9 3.1* 5.3  NEUTROABS 2.5  --   --   --   --   --   HGB 7.1*   < > 7.3* 6.6* 7.3* 8.8*  HCT 23.2*   < > 24.0* 22.2* 24.0* 29.1*  MCV 95.5   < > 95.6 95.7 95.2 95.7  PLT 124*   < > 132* 130* 119* 127*   < > = values in this interval not displayed.     Medications:    . vitamin C  1,000 mg Oral BID  . [START ON 12/20/2019] darbepoetin (ARANESP) injection - DIALYSIS  150 mcg Intravenous Q Wed-HD  . furosemide  80 mg Oral q AM  . iron polysaccharides  150 mg Oral QHS  . lactulose  30 g Oral BID  . metoprolol succinate  25 mg Oral Q M,W,F  . multivitamin with minerals  1 tablet Oral q AM  . omega-3 acid ethyl esters  2 g Oral BID  . pravastatin  20 mg Oral q AM  . rifaximin  550 mg Oral BID  . silver nitrate applicators  1 application Topical Once  . thiamine  100 mg Oral q AM  . venlafaxine  75 mg Oral TID WC  . vitamin B-12  1,000 mcg Oral Daily   Elmarie Shiley, MD 12/16/2019, 12:50 PM

## 2019-12-16 NOTE — Plan of Care (Signed)

## 2019-12-16 NOTE — Progress Notes (Signed)
[facility]                                                                              Patient Demographics  Jeffrey [name], is a 74 y.o. male, DOB - [birth year], ZCH:[id number]  Admit date - 12/13/2019   Admitting Physician Orson Eva, MD  Outpatient Primary MD for the patient is Monico Blitz, MD  Outpatient specialists:   LOS - 3  days   Medical records reviewed and are as summarized below:    Chief Complaint  Patient presents with   Weakness       Brief summary   74 y.o.malewith medical history ofalcoholic liver cirrhosis, ESRD(MWF), hypertension, diabetes mellitus type 2, hyperlipidemia, transfusion dependent anemia, portal hypertensive gastropathy, esophageal varices presenting with lethargy, generalized weakness, and confusion since 12/09/2019. The patient cannot provide any significant history secondary to encephalopathy. Also history is obtained from review of medical record and speaking with the patient's spouse.The patient missed dialysis on 12/11/2019 secondary to a bruise generalized weakness. His last HDwas on 12/08/2019. His only new medication is B12 supplementation. There is not been any fevers, chills, chest pain, worsening shortness of breath, nausea, vomiting, diarrhea, abdominal pain, dysuria, hematuria. The patient did have changed by urology on 12/12/2019. His wife has noted the patient to have more "jerking type movements"of his upper extremities for the last 2 to 3 days. At baseline, patient is alert and oriented x3 and uses a walker. In the emergency department, the patient was afebrile hemodynamically stable with oxygen saturation 99% on 3 L. Sodium was 124, potassium 5.7, serum creatinine 1.74. LFTs were unremarkable. WBC 3.4, hemoglobin 7.1, platelets 125,000. Nephrology was consulted to assist with management. The patient was admitted for further evaluation and treatment of his encephalopathy.   Assessment & Plan     Principal problem Acute hepatic encephalopathy -Patient was previously on lactulose, but was stopped when he developed C. difficile resulting in profuse diarrhea, has not been on lactulose for 6 months. -Continue lactulose, titrate for 3 BMs daily, continue rifaximin -Ammonia level down to 23, fairly alert and oriented, appears close to his baseline   Active problems Acute metabolic encephalopathy -Secondary to #1 and UTI -Patient was placed on IV ceftriaxone, started on 12/13/2019 -Holding hypnotic medications including Remeron, gabapentin, Zanaflex, Vistaril   Enterobacter UTI, in the presence of indwelling Foley catheter, POA -Urine culture showed more than 100,000 colonies of Enterobacter cloacae, resistant to cefazolin.  Sensitive to Cipro, gentamicin, imipenem, Zosyn, Bactrim -QTc 431, DC IV Rocephin changed to oral ciprofloxacin x 7days  BPH with chronic indwelling Foley catheter, POA -Patient has been following urology, has a indwelling Foley catheter due to chronic urinary retention Seen by Dr. Diona Fanti on 12/12/2019 patient had a catheter placed in January, 2021, had several catheter changes since that time.  Patient has significant limited mobility and was recommended indwelling Foley catheter.   Epistaxis with AB LA -Patient developed large amount of epistaxis on 12/14/2019, nasal tampon was inserted and achieved hemostasis -Patient has appointment with ENT, Dr. Benjamine Mola on 12/19/2019 at 1 PM to remove the nasal tampon and perform rhinoscopy.  Leave nasal tampon in place  until he sees Dr. Benjamine Mola -Received 3 units packed RBC transfusion during admission - Received IV vancomycin empirically for TSS, will transition to p.o. at the time of discharge   Transfusion dependent anemia/Chronic Blood loss anemia -Felt to be multifactorial including anemia of CKD, component of iron deficiency, and chronic blood loss anemia.  Extensive GI work-up in the past -09/19/2019 EGD--grade 1/2  varices, mild portal hypertensive gastropathy, moderate gastritis, single gastric polyp. There is no obvious source of melena or GI bleeding. It was felt that this blood loss may be due to friable gastric and duodenal mucosa in the setting of portal hypertension -Capsule endoscopy 09/21/19 unrevealing, continue B12 supplementation -Patient follows Dr. 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 intake -Continue close monitoring, CBGs currently controlled  Hyperlipidemia Continue statin, Lovaza  Hypertension Continue metoprolol succinate  Hyponatremia -Improving with HD   Obesity Estimated body mass index is 38.5 kg/m as calculated from the following:   Height as of this encounter: 5\' 7"  (1.702 m).   Weight as of this encounter: 111.5 kg.  Code Status: Full CODE STATUS DVT Prophylaxis:  SCD's Family Communication: Discussed all imaging results, lab results, explained to the patient    Disposition Plan:     Status is: Inpatient  Remains inpatient appropriate because:Inpatient level of care appropriate due to severity of illness   Dispo: The patient is from: Home              Anticipated d/c is to: Home              Anticipated d/c date is: 1 day              Patient currently is not medically stable to d/c.  Planned HD today      Time Spent in minutes 35 minutes  Procedures:  Nasal Rhino  Consultants:   ENT Nephrology  Antimicrobials:    Anti-infectives (From admission, onward)   Start     Dose/Rate Route Frequency Ordered Stop   12/14/19 1845  vancomycin (VANCOREADY) IVPB 1500 mg/300 mL        1,500 mg 150 mL/hr over 120 Minutes Intravenous  Once 12/14/19 1833 12/14/19 2155   12/13/19 2200  rifaximin (XIFAXAN) tablet 550 mg     Discontinue     550 mg Oral 2 times daily 12/13/19 1756     12/13/19 1800  cefTRIAXone (ROCEPHIN) 1 g in sodium chloride 0.9 % 100 mL IVPB     Discontinue     1 g 200 mL/hr over 30 Minutes Intravenous Every 24 hours 12/13/19 1756            Medications  Scheduled Meds:  vitamin C  1,000 mg Oral BID   [START ON 12/20/2019] darbepoetin (ARANESP) injection - DIALYSIS  150 mcg Intravenous Q Wed-HD   furosemide  80 mg Oral q AM   iron polysaccharides  150 mg Oral QHS   lactulose  30 g Oral BID   metoprolol succinate  25 mg Oral Q M,W,F   multivitamin with minerals  1 tablet Oral q AM   omega-3 acid ethyl esters  2 g Oral BID   pravastatin  20 mg Oral q AM   rifaximin  550 mg Oral BID   silver nitrate applicators  1 application Topical Once   thiamine  100 mg Oral q AM   venlafaxine  75 mg Oral TID WC   vitamin B-12  1,000 mcg Oral Daily   Continuous Infusions:  sodium chloride     sodium chloride     albumin human Stopped (12/14/19 1930)   cefTRIAXone (ROCEPHIN)  IV 1 g (12/15/19 1709)   PRN Meds:.sodium chloride, sodium chloride, acetaminophen **OR** acetaminophen, alteplase, heparin, lidocaine (PF), lidocaine-prilocaine, ondansetron **OR** ondansetron (ZOFRAN) IV, pentafluoroprop-tetrafluoroeth      Subjective:   Jeffrey [name] was seen and examined today.  No acute complaints, waiting for hemodialysis at the time of my examination. Patient denies dizziness, chest pain,  abdominal pain, N/V/D/C, new weakness, numbess, tingling. No acute events overnight.  Feeling better.  Objective:   Vitals:   12/15/19 1815 12/15/19 1955 12/16/19 0411 12/16/19 1311  BP:  (!) 143/63 (!) 157/67 (!) 150/75 (!) 159/63  Pulse: 93 99 93 93  Resp: 20 20 20 20   Temp: 98.9 F (37.2 C) 99.7 F (37.6 C) 98.5 F (36.9 C) 98.5 F (36.9 C)  TempSrc: Oral Oral Oral   SpO2: 100% 98% 99% 97%  Weight:      Height:        Intake/Output Summary (Last 24 hours) at 12/16/2019 1359 Last data filed at 12/16/2019 0957 Gross per 24 hour  Intake 240 ml  Output 1350 ml  Net -1110 ml     Wt Readings from Last 3 Encounters:  12/14/19 111.5 kg  12/12/19 117.9 kg  12/07/19 115.1 kg     Exam  General: Alert and oriented x 3, NAD, nasal tampon +  Cardiovascular: S1 S2 auscultated, no murmurs, RRR  Respiratory: Diminished breath sound at the bases  Gastrointestinal: Soft, nontender, nondistended, + bowel sounds  Ext: trace pedal edema bilaterally  Neuro: no new deficits  Musculoskeletal: No digital cyanosis, clubbing  Skin: No rashes  Psych: Normal affect and demeanor, alert and oriented x3    Data Reviewed:  I have personally reviewed following labs and imaging studies  Micro Results Recent Results (from the past 240 hour(s))  Culture, Urine     Status: Abnormal   Collection Time: 12/13/19 11:20 AM   Specimen: Urine, Clean Catch  Result Value Ref Range Status   Specimen Description   Final    URINE, CLEAN CATCH Performed at [facility], [address] [place], [place] [address]    Special Requests   Final    NONE Performed at [facility], [address] [place], [place] [address]    Culture >=100,000 COLONIES/mL ENTEROBACTER CLOACAE (A)  Final   Report Status 12/16/2019 FINAL  Final   Organism ID, Bacteria ENTEROBACTER CLOACAE (A)  Final      Susceptibility   Enterobacter cloacae - MIC*    CEFAZOLIN >=64 RESISTANT Resistant     CIPROFLOXACIN <=0.25 SENSITIVE Sensitive     GENTAMICIN <=1 SENSITIVE Sensitive     IMIPENEM <=0.25 SENSITIVE Sensitive     NITROFURANTOIN 64 INTERMEDIATE Intermediate     TRIMETH/SULFA <=20 SENSITIVE Sensitive      PIP/TAZO <=4 SENSITIVE Sensitive     * >=100,000 COLONIES/mL ENTEROBACTER CLOACAE  SARS Coronavirus 2 by RT PCR (hospital order, performed in [facility] hospital lab) Nasopharyngeal Nasopharyngeal  Swab     Status: None   Collection Time: 12/13/19 11:36 AM   Specimen: Nasopharyngeal Swab  Result Value Ref Range Status   SARS Coronavirus 2 NEGATIVE NEGATIVE Final    Comment: (NOTE) SARS-CoV-2 target nucleic acids are NOT DETECTED.  The SARS-CoV-2 RNA is generally detectable in upper and lower respiratory specimens during the acute phase of infection. The lowest concentration of SARS-CoV-2 viral copies this assay can detect is 250 copies / mL. A negative result does not preclude SARS-CoV-2 infection and should not be used as the sole basis for treatment or other patient management decisions.  A negative result may occur with improper specimen collection / handling, submission of specimen other than nasopharyngeal swab, presence of viral mutation(s) within the areas targeted by this assay, and inadequate number of viral copies (<250 copies / mL). A negative result must be combined with clinical observations, patient history, and epidemiological information.  Fact Sheet for Patients:   StrictlyIdeas.no  Fact Sheet for Healthcare Providers: BankingDealers.co.za  This test is not yet approved or  cleared by the [place] FDA and has been authorized for detection and/or diagnosis of SARS-CoV-2 by FDA under an Emergency Use Authorization (EUA).  This EUA will remain in effect (meaning this test can be used) for the duration of the COVID-19 declaration under Section 564(b)(1) of the Act, 21 U.S.C. section 360bbb-3(b)(1), unless the authorization is terminated or revoked sooner.  Performed at [facility], [address], [place], [place] [address]   MRSA PCR Screening     Status: None   Collection Time: 12/13/19  8:45 PM   Specimen:  Nasopharyngeal  Result Value Ref Range Status   MRSA by PCR NEGATIVE NEGATIVE Final    Comment:        The GeneXpert MRSA Assay (FDA approved for NASAL specimens only), is one component of a comprehensive MRSA colonization surveillance program. It is not intended to diagnose MRSA infection nor to guide or monitor treatment for MRSA infections. Performed at [facility], [address], [place], [place] [address]     Radiology Reports CT Head Wo Contrast  Result Date: 12/13/2019 CLINICAL DATA:  Altered mental status. EXAM: CT HEAD WITHOUT CONTRAST TECHNIQUE: Contiguous axial images were obtained from the base of the skull through the vertex without intravenous contrast. COMPARISON:  September 08, 2019. FINDINGS: Brain: Mild chronic ischemic white matter disease is noted. No mass effect or midline shift is noted. Ventricular size is within normal limits. There is no evidence of mass lesion, hemorrhage or acute infarction. Vascular: No hyperdense vessel or unexpected calcification. Skull: Normal. Negative for fracture or focal lesion. Sinuses/Orbits: No acute finding. Other: None. IMPRESSION: Mild chronic ischemic white matter disease. No acute intracranial abnormality seen. Electronically Signed   By: Marijo Conception M.D.   On: 12/13/2019 13:07    Lab Data:  CBC: Recent Labs  Lab 12/13/19 1048 12/13/19 1837 12/14/19 0601 12/15/19 0554 12/16/19 0618  WBC 3.4* 4.0 4.9 3.1* 5.3  NEUTROABS 2.5  --   --   --   --   HGB 7.1* 7.3* 6.6* 7.3* 8.8*  HCT 23.2* 24.0* 22.2* 24.0* 29.1*  MCV 95.5 95.6 95.7 95.2 95.7  PLT 124* 132* 130* 119* 008*   Basic Metabolic Panel: Recent Labs  Lab 12/13/19 1048 12/13/19 1837 12/14/19 0601 12/15/19 0554 12/16/19 0618  NA 124* 125* 131* 139 133*  K 5.7* 5.4* 4.4 3.6 3.7  CL 87* 89* 95* 88* 95*  CO2 23 24 27  27 28  GLUCOSE 96 74 69* 172* 174*  BUN 55* 56* 24* 23 34*  CREATININE 4.74* 4.91* 2.58* 2.27* 3.21*  CALCIUM 9.1 9.0 8.8* 9.0 9.0  PHOS  --   7.2*  --  3.7 4.2   GFR: Estimated Creatinine Clearance: 24.8 mL/min (A) (by C-G formula based on SCr of 3.21 mg/dL (H)). Liver Function Tests: Recent Labs  Lab 12/13/19 1048 12/13/19 1837 12/15/19 0554 12/16/19 0618  AST 22  --  26  --   ALT 21  --  23  --   ALKPHOS 129*  --  128*  --   BILITOT 0.9  --  1.2  --   PROT 7.1  --  6.1*  --   ALBUMIN 3.1* 3.1* 2.6* 2.7*   Recent Labs  Lab 12/13/19 1048  LIPASE 24   Recent Labs  Lab 12/13/19 1048 12/14/19 0601 12/15/19 0554 12/16/19 0618  AMMONIA 46* 96* 27 23   Coagulation Profile: Recent Labs  Lab 12/15/19 0554  INR 1.3*   Cardiac Enzymes: No results for input(s): CKTOTAL, CKMB, CKMBINDEX, TROPONINI in the last 168 hours. BNP (last 3 results) No results for input(s): PROBNP in the last 8760 hours. HbA1C: No results for input(s): HGBA1C in the last 72 hours. CBG: Recent Labs  Lab 12/15/19 1132 12/15/19 1624 12/15/19 2054 12/16/19 0747 12/16/19 1140  GLUCAP 224* 178* 229* 157* 241*   Lipid Profile: No results for input(s): CHOL, HDL, LDLCALC, TRIG, CHOLHDL, LDLDIRECT in the last 72 hours. Thyroid Function Tests: No results for input(s): TSH, T4TOTAL, FREET4, T3FREE, THYROIDAB in the last 72 hours. Anemia Panel: No results for input(s): VITAMINB12, FOLATE, FERRITIN, TIBC, IRON, RETICCTPCT in the last 72 hours. Urine analysis:    Component Value Date/Time   COLORURINE YELLOW 12/13/2019 1120   APPEARANCEUR HAZY (A) 12/13/2019 1120   LABSPEC 1.010 12/13/2019 1120   PHURINE 5.0 12/13/2019 1120   GLUCOSEU NEGATIVE 12/13/2019 1120   HGBUR NEGATIVE 12/13/2019 1120   BILIRUBINUR NEGATIVE 12/13/2019 1120   KETONESUR NEGATIVE 12/13/2019 1120   PROTEINUR NEGATIVE 12/13/2019 1120   NITRITE NEGATIVE 12/13/2019 1120   LEUKOCYTESUR MODERATE (A) 12/13/2019 1120     Jonice Cerra M.D. [facility] 12/16/2019, 1:59 PM   Call night coverage person covering after 7pm

## 2019-12-16 NOTE — Procedures (Signed)
HEMODIALYSIS TREATMENT NOTE:   Uneventful 3.5 hour heparin-free HD completed via right IJ TDC. Goal met: 4 liters removed without interruption in UF.  All blood was returned.  No changes from pre-HD assessment.  Rockwell Alexandria, RN

## 2019-12-16 NOTE — Progress Notes (Signed)
Pharmacy Antibiotic Note  Jeffrey [name] is a 74 y.o. male HD patient admitted on 12/13/2019 with UTI.  Pharmacy has been consulted for ciprofloxacin  dosing.  Plan: Start ciprofloxacin 200mg  IV q24h   Pharmacy will continue to monitor renal function,  cultures and patient progress.  Height: 5\' 7"  (170.2 cm) Weight: 111.5 kg (245 lb 13 oz) IBW/kg (Calculated) : 66.1  Temp (24hrs), Avg:99.1 F (37.3 C), Min:98.5 F (36.9 C), Max:99.8 F (37.7 C)  Recent Labs  Lab 12/13/19 1048 12/13/19 1837 12/14/19 0601 12/15/19 0554 12/16/19 0618  WBC 3.4* 4.0 4.9 3.1* 5.3  CREATININE 4.74* 4.91* 2.58* 2.27* 3.21*    Estimated Creatinine Clearance: 24.8 mL/min (A) (by C-G formula based on SCr of 3.21 mg/dL (H)).    Allergies  Allergen Reactions  . Chlorhexidine   . Tape Itching and Rash    Antimicrobials this admission: ceftriaxone 7/14>> 7/17 ciprofloxacin 7/17 >>    Dose adjustments this admission: ciprofloxacin  Microbiology results:  7/14 UCx:  >100K CFU E. cloacae--->sensitive to ciprofloxacin   7/14  MRSA PCR: negative  Thank you for allowing pharmacy to be a part of this patient's care.  Despina Pole 12/16/2019 2:26 PM

## 2019-12-17 LAB — GLUCOSE, CAPILLARY
Glucose-Capillary: 164 mg/dL — ABNORMAL HIGH (ref 70–99)
Glucose-Capillary: 230 mg/dL — ABNORMAL HIGH (ref 70–99)

## 2019-12-17 MED ORDER — CIPROFLOXACIN HCL 250 MG PO TABS: 500.0000 mg | ORAL_TABLET | Freq: Every day | ORAL | Status: DC

## 2019-12-17 MED ORDER — CIPROFLOXACIN HCL 500 MG PO TABS: 500.0000 mg | ORAL_TABLET | Freq: Every day | ORAL | 0 refills | Status: AC

## 2019-12-17 MED ORDER — CEPHALEXIN 500 MG PO CAPS
500.0000 mg | ORAL_CAPSULE | Freq: Two times a day (BID) | ORAL | Status: DC
  Administered 2019-12-17: 500 mg via ORAL
  Filled 2019-12-17: qty 1

## 2019-12-17 MED ORDER — CEPHALEXIN 500 MG PO CAPS: 500.0000 mg | ORAL_CAPSULE | Freq: Two times a day (BID) | ORAL | 0 refills | Status: AC

## 2019-12-17 MED ORDER — LACTULOSE 10 GM/15ML PO SOLN: 30.0000 g | Freq: Two times a day (BID) | ORAL | 0 refills | Status: DC

## 2019-12-17 NOTE — Progress Notes (Signed)
Pharmacy Antibiotic Note  Jeffrey [name] is a 74 y.o. male HD patient admitted on 12/13/2019 with UTI.  Pharmacy has been consulted for cephalexin   dosing to prevent toxic shock syndrome.  Plan: cephalexin 500mg  po q12h appropriate for renal function. Continue oral ciprofloxacin    Pharmacy will continue to monitor renal function,  cultures and patient progress.  Height: 5\' 7"  (170.2 cm) Weight: 108.2 kg (238 lb 8.6 oz) IBW/kg (Calculated) : 66.1  Temp (24hrs), Avg:98.5 F (36.9 C), Min:98.2 F (36.8 C), Max:98.8 F (37.1 C)  Recent Labs  Lab 12/13/19 1048 12/13/19 1837 12/14/19 0601 12/15/19 0554 12/16/19 0618  WBC 3.4* 4.0 4.9 3.1* 5.3  CREATININE 4.74* 4.91* 2.58* 2.27* 3.21*    Estimated Creatinine Clearance: 24.4 mL/min (A) (by C-G formula based on SCr of 3.21 mg/dL (H)).    Allergies  Allergen Reactions  . Chlorhexidine   . Tape Itching and Rash    Antimicrobials this admission: ceftriaxone 7/14>> 7/17 ciprofloxacin 7/17 >>   cephalexin  Dose adjustments this admission: Ciprofloxacin, cephalexin  Microbiology results:  7/14 UCx:  >100K CFU E. cloacae--->sensitive to ciprofloxacin   7/14  MRSA PCR: negative  Thank you for allowing pharmacy to be a part of this patient's care.  Despina Pole 12/17/2019 10:00 AM

## 2019-12-17 NOTE — Discharge Summary (Signed)
Physician Discharge Summary   Patient ID: Jeffrey [name] MRN: [id number] DOB/AGE: 73/29/1947 73 y.o.  Admit date: 12/13/2019 Discharge date: 12/17/2019  Primary Care Physician:  Jeffrey Blitz, MD   Recommendations for Outpatient Follow-up:  1. Follow up with PCP in 1-2 weeks 2. Patient has appointment with ENT, Dr. Benjamine [name] on Tuesday, 12/19/2019 at 1 PM to remove the nasal tampon and perform rhinoscopy 3. Placed on lactulose 30 g p.o. twice daily, titrate to 2-3 bowel movements in a day 4. Please adjust patient's insulin regimen    Home Health: Home health PT OT RN, aide Equipment/Devices:   Discharge Condition: stable CODE STATUS: FULL  Diet recommendation: Carb modified diet   Discharge Diagnoses:    . Acute hepatic encephalopathy (HCC) Acute metabolic encephalopathy secondary to UTI Enterobacter UTI in the presence of indwelling Foley catheter, present on admission . Cirrhosis ([place]) . Acute on chronic anemia due to chronic blood loss . 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 Chlorhexidine   . Tape Itching and Rash     DISCHARGE MEDICATIONS: Allergies as of 12/17/2019      Reactions   Chlorhexidine    Tape Itching, Rash      Medication List    STOP taking these medications   cholestyramine 4 GM/DOSE powder Commonly known as: QUESTRAN   hydrOXYzine 25 MG capsule Commonly known as: VISTARIL   mirtazapine 15 MG tablet Commonly known as: REMERON     TAKE these medications   cephALEXin 500 MG capsule Commonly known as: KEFLEX Take 1 capsule (500 mg total) by mouth every 12 (twelve) hours for 5 days.   ciprofloxacin 500 MG tablet Commonly known as: CIPRO Take 1 tablet (500 mg total) by mouth daily with  supper for 5 days.   furosemide 80 MG tablet Commonly known as: LASIX Take 80 mg by mouth in the morning.   gabapentin 100 MG capsule Commonly known as: NEURONTIN TAKE 1 CAPSULE BY MOUTH AT BEDTIME   HumaLOG KwikPen 100 UNIT/ML KwikPen Generic drug: insulin lispro as needed. Sliding scale   HYDROcodone-acetaminophen 10-325 MG tablet Commonly known as: NORCO Take 1 tablet by mouth every 6 (six) hours as needed for moderate pain. What changed: when to take this   iron polysaccharides 150 MG capsule Commonly known as: NIFEREX Take 150 mg by mouth at bedtime.   lactulose 10 GM/15ML solution Commonly known as: CHRONULAC Take 45 mLs (30 g total) by mouth 2 (two) times daily.   metoprolol succinate 25 MG 24 hr tablet Commonly known as: TOPROL-XL Take 25 mg by mouth daily. Only on Monday ,Wednesday and Friday   milk thistle 175 MG tablet Take 175 mg by mouth 3 (three) times daily.   multivitamin with minerals Tabs tablet Take 1 tablet by mouth in the morning.   omega-3 acid ethyl esters 1 g capsule Commonly known as: LOVAZA Take 2 g by mouth 2 (two) times daily.   omeprazole 40 MG capsule Commonly known as: PRILOSEC Take 40 mg by mouth in the morning.   pravastatin 20 MG tablet Commonly known as: PRAVACHOL Take 20 mg by mouth in the morning.   PROBIOTIC DAILY PO Take 1 tablet by mouth in the morning.   rifaximin 550 MG Tabs tablet Commonly known as: Xifaxan Take 1 tablet (  550 mg total) by mouth 2 (two) times daily.   spironolactone 50 MG tablet Commonly known as: ALDACTONE Take 50 mg by mouth in the morning.   tamsulosin 0.4 MG Caps capsule Commonly known as: FLOMAX Take 0.4 mg by mouth every evening.   thiamine 100 MG tablet Take 100 mg by mouth in the morning. Vitamin B-1   tiZANidine 2 MG tablet Commonly known as: ZANAFLEX Take 2 mg by mouth as needed for muscle spasms.   [name] Generic drug: insulin  degludec Inject 120 Units into the skin at bedtime. When blood sugars are up   triamcinolone cream 0.1 % Commonly known as: KENALOG Apply 1 application topically 2 (two) times daily as needed (dry skin Itching).   venlafaxine 75 MG tablet Commonly known as: EFFEXOR Take 75 mg by mouth 3 (three) times daily with meals.   vitamin B-12 1000 MCG tablet Commonly known as: CYANOCOBALAMIN Take 1,000 mcg by mouth daily.   vitamin C 1000 MG tablet Take 1,000 mg by mouth 2 (two) times daily.        Brief H and P: For complete details please refer to admission H and P, but in brief, 74 y.o.malewith medical history ofalcoholic liver cirrhosis, ESRD(MWF), hypertension, diabetes mellitus type 2, hyperlipidemia, transfusion dependent anemia, portal hypertensive gastropathy, esophageal varices presenting with lethargy, generalized weakness, and confusion since 12/09/2019. The patient cannot provide any significant history secondary to encephalopathy. Also history is obtained from review of medical record and speaking with the patient's spouse.The patient missed dialysis on 12/11/2019 secondary to a bruise generalized weakness. His last HDwas on 12/08/2019. His only new medication is B12 supplementation. There is not been any fevers, chills, chest pain, worsening shortness of breath, nausea, vomiting, diarrhea, abdominal pain, dysuria, hematuria. The patient did have changed by urology on 12/12/2019. His wife has noted the patient to have more "jerking type movements"of his upper extremities for the last 2 to 3 days. At baseline, patient is alert and oriented x3 and uses a walker. In the emergency department, the patient was afebrile hemodynamically stable with oxygen saturation 99% on 3 L. Sodium was 124, potassium 5.7, serum creatinine 1.74. LFTs were unremarkable. WBC 3.4, hemoglobin 7.1, platelets 125,000. Nephrology was consulted to assist with management. The patient was admitted for  further evaluation and treatment of his encephalopathy.  Hospital Course:  Acute hepatic encephalopathy -Patient was previously on lactulose, but was stopped when he developed C. difficile resulting in profuse diarrhea, has not been on lactulose for 6 months. -Continue lactulose, titrate for 3 BMs daily, continue rifaximin -Ammonia level down to 23, patient is alert and oriented, appears close to his baseline   Acute metabolic encephalopathy -Secondary to #1 and UTI -Patient was placed on IV ceftriaxone, started on 12/13/2019 -Hypnotic medications including Remeron, gabapentin, Zanaflex, Vistaril were held    Enterobacter UTI, in the presence of indwelling Foley catheter, POA -Urine culture showed more than 100,000 colonies of Enterobacter cloacae, resistant to cefazolin.  Sensitive to Cipro, gentamicin, imipenem, Zosyn, Bactrim -QTc 431, IV Rocephin was discontinued, transitioned to oral ciprofloxacin x 7days  BPH with chronic indwelling Foley catheter, POA -Patient has been following urology, has a indwelling Foley catheter due to chronic urinary retention Seen by Dr. Diona Fanti on 12/12/2019 patient had a catheter placed in January, 2021, had several catheter changes since that time.  Patient has significant limited mobility and was recommended indwelling Foley catheter. -Continue indwelling Foley catheter and follow urology outpatient  Epistaxis with AB LA -Patient developed large amount of epistaxis on 12/14/2019, nasal tampon was inserted and achieved hemostasis -Patient has appointment with ENT, Dr. Benjamine [name] on 12/19/2019 at 1 PM to remove the nasal tampon and perform rhinoscopy.  Leave nasal tampon in place until he sees Dr. Benjamine [name] -Received 3 units packed RBC transfusion during admission - Received IV vancomycin empirically for TSS, transitioned to oral Keflex for 5 days   Transfusion dependent anemia/Chronic Blood loss anemia -Felt to be multifactorial including anemia of  CKD, component of iron deficiency, and chronic blood loss anemia.  Extensive GI work-up in the past -09/19/2019 EGD--grade 1/2 varices, mild portal hypertensive gastropathy, moderate gastritis, single gastric polyp. There is no obvious source of melena or GI bleeding. It was felt that this blood loss may be due to friable gastric and duodenal mucosa in the setting of portal hypertension -Capsule endoscopy 09/21/19 unrevealing, continue B12 supplementation -Patient follows Dr. 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 intake  -Resume insulin regimen at home and follow closely.  Recommended adjust insulin regimen outpatient  Hyperlipidemia Continue statin, Lovaza  Hypertension Continue metoprolol succinate  Hyponatremia -Improving with HD   Obesity Estimated body mass index is 38.5 kg/m as calculated from the following:   Height as of this encounter: 5\' 7"  (1.702 m).   Weight as of this encounter: 111.5 kg.  Day of Discharge S: No complaints, feels ready to go home, overnight no acute issues.  Alert and oriented  BP (!) 161/68   Pulse 97   Temp 98.8 F (37.1 C) (Oral)   Resp 17   Ht 5\' 7"  (1.702 m)   Wt 108.2 kg   SpO2 100%   BMI 37.36 kg/m   Physical Exam: General: Alert and awake oriented x3 not in any acute distress. HEENT: anicteric sclera, pupils reactive to light and accommodation CVS: S1-S2 clear no murmur rubs or gallops Chest: clear to  auscultation bilaterally, no wheezing rales or rhonchi Abdomen: soft nontender, nondistended, normal bowel sounds Extremities: no cyanosis, clubbing or edema noted bilaterally Neuro: no new deficits    Get Medicines reviewed and adjusted: Please take all your medications with you for your next visit with your Primary MD  Please request your Primary MD to go over all hospital tests and procedure/radiological results at the follow up. Please ask your Primary MD to get all Hospital records sent to his/her office.  If you experience worsening of your admission symptoms, develop shortness of breath, life threatening emergency, suicidal or homicidal thoughts you must seek medical attention immediately by calling 911 or calling your MD immediately  if symptoms less severe.  You must read complete instructions/literature along with all the possible adverse reactions/side effects for all the Medicines you take and that have been prescribed to you. Take any new Medicines after you have completely understood and accept all the possible adverse reactions/side effects.   Do not drive when taking pain medications.   Do not take more than prescribed Pain, Sleep and Anxiety Medications  Special Instructions: If you  have smoked or chewed Tobacco  in the last 2 yrs please stop smoking, stop any regular Alcohol  and or any Recreational drug use.  Wear Seat belts while driving.  Please note  You were cared for by a hospitalist during your hospital stay. Once you are discharged, your primary care physician will handle any further medical issues. Please note that NO REFILLS for any discharge medications will be authorized once you are discharged, as it is imperative that you return to your primary care physician (or establish a relationship with a primary care physician if you do not have one) for your aftercare needs so that they can reassess your need for medications and monitor your lab values.   The  results of significant diagnostics from this hospitalization (including imaging, microbiology, ancillary and laboratory) are listed below for reference.      Procedures/Studies:  CT Head Wo Contrast  Result Date: 12/13/2019 CLINICAL DATA:  Altered mental status. EXAM: CT HEAD WITHOUT CONTRAST TECHNIQUE: Contiguous axial images were obtained from the base of the skull through the vertex without intravenous contrast. COMPARISON:  September 08, 2019. FINDINGS: Brain: Mild chronic ischemic white matter disease is noted. No mass effect or midline shift is noted. Ventricular size is within normal limits. There is no evidence of mass lesion, hemorrhage or acute infarction. Vascular: No hyperdense vessel or unexpected calcification. Skull: Normal. Negative for fracture or focal lesion. Sinuses/Orbits: No acute finding. Other: None. IMPRESSION: Mild chronic ischemic white matter disease. No acute intracranial abnormality seen. Electronically Signed   By: Marijo Conception M.D.   On: 12/13/2019 13:07       LAB RESULTS: Basic Metabolic Panel: Recent Labs  Lab 12/15/19 0554 12/15/19 0554 12/16/19 0618  NA 139  --  133*  K 3.6  --  3.7  CL 88*  --  95*  CO2 27  --  28  GLUCOSE 172*  --  174*  BUN 23  --  34*  CREATININE 2.27*  --  3.21*  CALCIUM 9.0  --  9.0  PHOS 3.7   < > 4.2   < > = values in this interval not displayed.   Liver Function Tests: Recent Labs  Lab 12/13/19 1048 12/13/19 1837 12/15/19 0554 12/16/19 0618  AST 22  --  26  --   ALT 21  --  23  --   ALKPHOS 129*  --  128*  --   BILITOT 0.9  --  1.2  --   PROT 7.1  --  6.1*  --   ALBUMIN 3.1*   < > 2.6* 2.7*   < > = values in this interval not displayed.   Recent Labs  Lab 12/13/19 1048  LIPASE 24   Recent Labs  Lab 12/15/19 0554 12/16/19 0618  AMMONIA 27 23   CBC: Recent Labs  Lab 12/13/19 1048 12/13/19 1837 12/15/19 0554 12/15/19 0554 12/16/19 0618  WBC 3.4*   < > 3.1*  --  5.3  NEUTROABS 2.5  --   --   --    --   HGB 7.1*   < > 7.3*  --  8.8*  HCT 23.2*   < > 24.0*  --  29.1*  MCV 95.5   < > 95.2   < > 95.7  PLT 124*   < > 119*  --  127*   < > = values in this interval not displayed.   Cardiac Enzymes: No results for input(s): CKTOTAL, CKMB, CKMBINDEX, TROPONINI  in the last 168 hours. BNP: Invalid input(s): POCBNP CBG: Recent Labs  Lab 12/17/19 0715 12/17/19 1102  GLUCAP 164* 230*       Disposition and Follow-up: Discharge Instructions    Diet Carb Modified   Complete by: As directed    Increase activity slowly   Complete by: As directed    No wound care   Complete by: As directed        DISPOSITION: Home with home health   Holcomb, Su, MD Follow up on 12/19/2019.   Specialty: Otolaryngology Why: at 1 PM Contact information: [address] [place] [address] [phone]        Jeffrey Blitz, MD. Schedule an appointment as soon as possible for a visit in 2 week(s).   Specialty: Internal Medicine Contact information: [place] [address] [phone]                Time coordinating discharge:  35 minutes Signed:   Estill Cotta M.D. [facility] 12/17/2019, 11:38 AM

## 2019-12-17 NOTE — Plan of Care (Signed)
  Problem: Acute Rehab PT Goals(only PT should resolve) Goal: Pt Will Go Sit To Supine/Side Outcome: Progressing Flowsheets (Taken 12/17/2019 0940) Pt will go Sit to Supine/Side: with modified independence Goal: Patient Will Transfer Sit To/From Stand Outcome: Progressing Flowsheets (Taken 12/17/2019 0940) Patient will transfer sit to/from stand: with modified independence Goal: Pt Will Transfer Bed To Chair/Chair To Bed Outcome: Progressing Flowsheets (Taken 12/17/2019 0940) Pt will Transfer Bed to Chair/Chair to Bed: with modified independence Goal: Pt Will Ambulate Outcome: Progressing Flowsheets (Taken 12/17/2019 0940) Pt will Ambulate:  > 125 feet  with modified independence  with least restrictive assistive device  Clarene Critchley PT, DPT 9:40 AM, 12/17/19 [phone]

## 2019-12-17 NOTE — Evaluation (Signed)
Physical Therapy Evaluation Patient Details Name: Jeffrey [name] MRN: [id number] DOB: June 27, 1945 Today's Date: 12/17/2019   History of Present Illness  Jeffrey [name] is a 74 y.o. male with medical history of alcoholic liver cirrhosis, ESRD (MWF), hypertension, diabetes mellitus type 2, hyperlipidemia, transfusion dependent anemia, portal hypertensive gastropathy, esophageal varices presenting with lethargy, generalized weakness, and confusion since 12/09/2019.  The patient cannot provide any significant history secondary to encephalopathy.  Also history is obtained from review of medical record and speaking with the patient's spouse.  The patient missed dialysis on 12/11/2019 secondary to a bruise generalized weakness.  His last HD was on 12/08/2019.  His only new medication is B12 supplementation.  There is not been any fevers, chills, chest pain, worsening shortness of breath, nausea, vomiting, diarrhea, abdominal pain, dysuria, hematuria.  The patient did have changed by urology on 12/12/2019.  His wife has noted the patient to have more " jerking type movements" of his upper extremities for the last 2 to 3 days.  At baseline, patient is alert and oriented x3 and uses a walker.    Clinical Impression  Patient was awake and alert upon entering room. Patient reported being on home O2 and was on 3 L O2 via nasal cannula throughout session. Patient performed bed mobility with min A. Patient performed transfers with min guard and ambulation a household distance with supervision. Patient reported his wife will be home at all times to help as needed. Patient demonstrates good balance with use of AD. Patient left in bedside chair with breakfast and all needs met. Patient cognitively aware so educated that he needs to call nurse with call bell which is within reach prior to getting up. Patient confirmed understanding. Patient would benefit from skilled physical therapy while in hospital and with recommended venue below to  continue progressing strength, balance, and overall functional mobility.     Follow Up Recommendations Home health PT;Supervision/Assistance - 24 hour;Supervision for mobility/OOB    Equipment Recommendations  None recommended by PT;Other (comment) (PT should use rolling walker, patient reported already having one at home)    Recommendations for Other Services       Precautions / Restrictions Precautions Precautions: Fall Restrictions Weight Bearing Restrictions: No      Mobility  Bed Mobility Overal bed mobility: Needs Assistance Bed Mobility: Supine to Sit     Supine to sit: Min assist     General bed mobility comments: Min A for supine to sit  Transfers Overall transfer level: Needs assistance Equipment used: Rolling walker (2 wheeled) Transfers: Sit to/from Stand Sit to Stand: Min guard            Ambulation/Gait Ambulation/Gait assistance: Supervision Gait Distance (Feet): 75 Feet Assistive device: Rolling walker (2 wheeled) Gait Pattern/deviations: Decreased step length - left;Decreased step length - right Gait velocity: Decreased      Stairs            Wheelchair Mobility    Modified Rankin (Stroke Patients Only)       Balance Overall balance assessment: Needs assistance Sitting-balance support: Bilateral upper extremity supported Sitting balance-Leahy Scale: Good Sitting balance - Comments: Patient sits at EOB unassisted   Standing balance support: Bilateral upper extremity supported;During functional activity Standing balance-Leahy Scale: Good Standing balance comment: Patient with good standing balance with walker                             Pertinent Vitals/Pain  Pain Assessment: Faces Faces Pain Scale: Hurts a little bit Pain Location: Stomach Pain Descriptors / Indicators: Numbness Pain Intervention(s): Limited activity within patient's tolerance;Monitored during session    [place] expects to  be discharged to:: Private residence Living Arrangements: Spouse/significant other Available Help at Discharge: Family Type of Home: House Home Access: Ramped entrance     Home Layout: Able to live on main level with bedroom/bathroom Home Equipment: Walker - 2 wheels;Walker - 4 wheels      Prior Function Level of Independence: Independent with assistive device(s)         Comments: Patient reported household community ambulation     Hand Dominance        Extremity/Trunk Assessment   Upper Extremity Assessment Upper Extremity Assessment: Generalized weakness    Lower Extremity Assessment Lower Extremity Assessment: Generalized weakness    Cervical / Trunk Assessment Cervical / Trunk Assessment: Normal  Communication   Communication: No difficulties  Cognition Arousal/Alertness: Awake/alert Behavior During Therapy: WFL for tasks assessed/performed Overall Cognitive Status: Within Functional Limits for tasks assessed                                        General Comments      Exercises     Assessment/Plan    PT Assessment Patient needs continued PT services  PT Problem List Decreased strength;Decreased mobility;Decreased activity tolerance;Decreased balance       PT Treatment Interventions DME instruction;Gait training;Functional mobility training;Therapeutic activities;Therapeutic exercise;Balance training;Neuromuscular re-education;Patient/family education    PT Goals (Current goals can be found in the Care Plan section)  Acute Rehab PT Goals Patient Stated Goal: To return home with family PT Goal Formulation: With patient Time For Goal Achievement: 12/24/19 Potential to Achieve Goals: Good    Frequency Min 3X/week   Barriers to discharge        Co-evaluation               AM-PAC PT "6 Clicks" Mobility  Outcome Measure Help needed turning from your back to your side while in a flat bed without using bedrails?: A  Little Help needed moving from lying on your back to sitting on the side of a flat bed without using bedrails?: A Little Help needed moving to and from a bed to a chair (including a wheelchair)?: A Little Help needed standing up from a chair using your arms (e.g., wheelchair or bedside chair)?: A Little Help needed to walk in hospital room?: A Little Help needed climbing 3-5 steps with a railing? : A Little 6 Click Score: 18    End of Session Equipment Utilized During Treatment: Gait belt Activity Tolerance: Patient tolerated treatment well;Patient limited by fatigue Patient left: in chair;with call bell/phone within reach (Explained to patient he should use call bell before getting up) Nurse Communication: Mobility status PT Visit Diagnosis: Unsteadiness on feet (R26.81);Other abnormalities of gait and mobility (R26.89);Muscle weakness (generalized) (M62.81)    Time: 8119-1478 PT Time Calculation (min) (ACUTE ONLY): 39 min   Charges:   PT Evaluation $PT Eval Low Complexity: 1 Low $PT Eval Moderate Complexity: 1 Mod         Clarene Critchley PT, DPT 9:39 AM, 12/17/19 [phone]

## 2019-12-17 NOTE — Progress Notes (Signed)
Nsg Discharge Note  Admit Date:  12/13/2019 Discharge date: 12/17/2019   Jeffrey [name] to be D/C'd Home per MD order.  AVS completed.  Copy for chart, and copy for patient signed, and dated. Patient/caregiver able to verbalize understanding.  Discharge Medication: Allergies as of 12/17/2019      Reactions   Chlorhexidine    Tape Itching, Rash      Medication List    STOP taking these medications   cholestyramine 4 GM/DOSE powder Commonly known as: QUESTRAN   hydrOXYzine 25 MG capsule Commonly known as: VISTARIL   mirtazapine 15 MG tablet Commonly known as: REMERON     TAKE these medications   cephALEXin 500 MG capsule Commonly known as: KEFLEX Take 1 capsule (500 mg total) by mouth every 12 (twelve) hours for 5 days.   ciprofloxacin 500 MG tablet Commonly known as: CIPRO Take 1 tablet (500 mg total) by mouth daily with supper for 5 days.   furosemide 80 MG tablet Commonly known as: LASIX Take 80 mg by mouth in the morning.   gabapentin 100 MG capsule Commonly known as: NEURONTIN TAKE 1 CAPSULE BY MOUTH AT BEDTIME   HumaLOG KwikPen 100 UNIT/ML KwikPen Generic drug: insulin lispro as needed. Sliding scale   HYDROcodone-acetaminophen 10-325 MG tablet Commonly known as: NORCO Take 1 tablet by mouth every 6 (six) hours as needed for moderate pain. What changed: when to take this   iron polysaccharides 150 MG capsule Commonly known as: NIFEREX Take 150 mg by mouth at bedtime.   lactulose 10 GM/15ML solution Commonly known as: CHRONULAC Take 45 mLs (30 g total) by mouth 2 (two) times daily.   metoprolol succinate 25 MG 24 hr tablet Commonly known as: TOPROL-XL Take 25 mg by mouth daily. Only on Monday ,Wednesday and Friday   milk thistle 175 MG tablet Take 175 mg by mouth 3 (three) times daily.   multivitamin with minerals Tabs tablet Take 1 tablet by mouth in the morning.   omega-3 acid ethyl esters 1 g capsule Commonly known as: LOVAZA Take 2 g by  mouth 2 (two) times daily.   omeprazole 40 MG capsule Commonly known as: PRILOSEC Take 40 mg by mouth in the morning.   pravastatin 20 MG tablet Commonly known as: PRAVACHOL Take 20 mg by mouth in the morning.   PROBIOTIC DAILY PO Take 1 tablet by mouth in the morning.   rifaximin 550 MG Tabs tablet Commonly known as: Xifaxan Take 1 tablet (550 mg total) by mouth 2 (two) times daily.   spironolactone 50 MG tablet Commonly known as: ALDACTONE Take 50 mg by mouth in the morning.   tamsulosin 0.4 MG Caps capsule Commonly known as: FLOMAX Take 0.4 mg by mouth every evening.   thiamine 100 MG tablet Take 100 mg by mouth in the morning. Vitamin B-1   tiZANidine 2 MG tablet Commonly known as: ZANAFLEX Take 2 mg by mouth as needed for muscle spasms.   [name] Generic drug: insulin degludec Inject 120 Units into the skin at bedtime. When blood sugars are up   triamcinolone cream 0.1 % Commonly known as: KENALOG Apply 1 application topically 2 (two) times daily as needed (dry skin Itching).   venlafaxine 75 MG tablet Commonly known as: EFFEXOR Take 75 mg by mouth 3 (three) times daily with meals.   vitamin B-12 1000 MCG tablet Commonly known as: CYANOCOBALAMIN Take 1,000 mcg by mouth daily.   vitamin C 1000 MG tablet  Take 1,000 mg by mouth 2 (two) times daily.       Discharge Assessment: Vitals:   12/17/19 0537 12/17/19 0850  BP: (!) 164/70 (!) 161/68  Pulse: 97 97  Resp: 17   Temp: 98.8 F (37.1 C)   SpO2: 100%    Skin clean, dry and intact without evidence of skin break down, no evidence of skin tears noted. IV catheter discontinued intact. Site without signs and symptoms of complications - no redness or edema noted at insertion site, patient denies c/o pain - only slight tenderness at site.  Dressing with slight pressure applied.  D/c Instructions-Education: Discharge instructions given to patient/family with verbalized  understanding. D/c education completed with patient/family including follow up instructions, medication list, d/c activities limitations if indicated, with other d/c instructions as indicated by MD - patient able to verbalize understanding, all questions fully answered. Patient instructed to return to ED, call 911, or call MD for any changes in condition.  Patient escorted via [place], and D/C home via private auto.  Zenaida Deed, RN 12/17/2019 2:30 PM

## 2019-12-18 ENCOUNTER — Other Ambulatory Visit (HOSPITAL_COMMUNITY): Payer: Self-pay | Admitting: *Deleted

## 2019-12-18 DIAGNOSIS — I10 Essential (primary) hypertension: Secondary | ICD-10-CM | POA: Diagnosis not present

## 2019-12-18 DIAGNOSIS — Z992 Dependence on renal dialysis: Secondary | ICD-10-CM | POA: Diagnosis not present

## 2019-12-18 DIAGNOSIS — N2581 Secondary hyperparathyroidism of renal origin: Secondary | ICD-10-CM | POA: Diagnosis not present

## 2019-12-18 DIAGNOSIS — D500 Iron deficiency anemia secondary to blood loss (chronic): Secondary | ICD-10-CM

## 2019-12-18 DIAGNOSIS — E1165 Type 2 diabetes mellitus with hyperglycemia: Secondary | ICD-10-CM | POA: Diagnosis not present

## 2019-12-18 DIAGNOSIS — Z299 Encounter for prophylactic measures, unspecified: Secondary | ICD-10-CM | POA: Diagnosis not present

## 2019-12-18 DIAGNOSIS — E878 Other disorders of electrolyte and fluid balance, not elsewhere classified: Secondary | ICD-10-CM | POA: Diagnosis not present

## 2019-12-18 DIAGNOSIS — R040 Epistaxis: Secondary | ICD-10-CM | POA: Diagnosis not present

## 2019-12-18 DIAGNOSIS — N186 End stage renal disease: Secondary | ICD-10-CM | POA: Diagnosis not present

## 2019-12-19 ENCOUNTER — Ambulatory Visit (HOSPITAL_COMMUNITY): Payer: Medicare Other

## 2019-12-20 DIAGNOSIS — N186 End stage renal disease: Secondary | ICD-10-CM | POA: Diagnosis not present

## 2019-12-20 DIAGNOSIS — Z992 Dependence on renal dialysis: Secondary | ICD-10-CM | POA: Diagnosis not present

## 2019-12-21 ENCOUNTER — Other Ambulatory Visit (HOSPITAL_COMMUNITY): Payer: Medicare Other

## 2019-12-21 ENCOUNTER — Ambulatory Visit (HOSPITAL_COMMUNITY): Payer: Medicare Other

## 2019-12-22 DIAGNOSIS — N2581 Secondary hyperparathyroidism of renal origin: Secondary | ICD-10-CM | POA: Diagnosis not present

## 2019-12-22 DIAGNOSIS — N186 End stage renal disease: Secondary | ICD-10-CM | POA: Diagnosis not present

## 2019-12-22 DIAGNOSIS — Z992 Dependence on renal dialysis: Secondary | ICD-10-CM | POA: Diagnosis not present

## 2019-12-25 DIAGNOSIS — N186 End stage renal disease: Secondary | ICD-10-CM | POA: Diagnosis not present

## 2019-12-25 DIAGNOSIS — Z992 Dependence on renal dialysis: Secondary | ICD-10-CM | POA: Diagnosis not present

## 2019-12-26 DIAGNOSIS — I509 Heart failure, unspecified: Secondary | ICD-10-CM | POA: Diagnosis not present

## 2019-12-26 DIAGNOSIS — R0602 Shortness of breath: Secondary | ICD-10-CM | POA: Diagnosis not present

## 2019-12-27 DIAGNOSIS — N186 End stage renal disease: Secondary | ICD-10-CM | POA: Diagnosis not present

## 2019-12-27 DIAGNOSIS — Z79899 Other long term (current) drug therapy: Secondary | ICD-10-CM | POA: Diagnosis not present

## 2019-12-27 DIAGNOSIS — Z992 Dependence on renal dialysis: Secondary | ICD-10-CM | POA: Diagnosis not present

## 2019-12-28 ENCOUNTER — Ambulatory Visit (HOSPITAL_COMMUNITY): Payer: Medicare Other

## 2019-12-29 DIAGNOSIS — E1165 Type 2 diabetes mellitus with hyperglycemia: Secondary | ICD-10-CM | POA: Diagnosis not present

## 2019-12-29 DIAGNOSIS — R1011 Right upper quadrant pain: Secondary | ICD-10-CM | POA: Diagnosis not present

## 2019-12-29 DIAGNOSIS — I509 Heart failure, unspecified: Secondary | ICD-10-CM | POA: Diagnosis not present

## 2019-12-29 DIAGNOSIS — I10 Essential (primary) hypertension: Secondary | ICD-10-CM | POA: Diagnosis not present

## 2019-12-29 DIAGNOSIS — Z299 Encounter for prophylactic measures, unspecified: Secondary | ICD-10-CM | POA: Diagnosis not present

## 2019-12-30 DIAGNOSIS — Z992 Dependence on renal dialysis: Secondary | ICD-10-CM | POA: Diagnosis not present

## 2019-12-30 DIAGNOSIS — N186 End stage renal disease: Secondary | ICD-10-CM | POA: Diagnosis not present

## 2019-12-31 ENCOUNTER — Emergency Department (HOSPITAL_COMMUNITY): Payer: Medicare Other

## 2019-12-31 ENCOUNTER — Inpatient Hospital Stay (HOSPITAL_COMMUNITY)
Admission: EM | Admit: 2019-12-31 | Discharge: 2020-01-06 | DRG: 432 | Disposition: A | Discharge status: Home Health | Payer: Medicare Other | Attending: Internal Medicine | Admitting: Internal Medicine

## 2019-12-31 ENCOUNTER — Encounter (HOSPITAL_COMMUNITY): Payer: Self-pay | Admitting: Emergency Medicine

## 2019-12-31 ENCOUNTER — Other Ambulatory Visit: Payer: Self-pay

## 2019-12-31 DIAGNOSIS — K7682 Hepatic encephalopathy: Secondary | ICD-10-CM

## 2019-12-31 DIAGNOSIS — E875 Hyperkalemia: Secondary | ICD-10-CM | POA: Diagnosis not present

## 2019-12-31 DIAGNOSIS — K7460 Unspecified cirrhosis of liver: Secondary | ICD-10-CM | POA: Diagnosis not present

## 2019-12-31 DIAGNOSIS — Z6837 Body mass index (BMI) 37.0-37.9, adult: Secondary | ICD-10-CM

## 2019-12-31 DIAGNOSIS — K7290 Hepatic failure, unspecified without coma: Secondary | ICD-10-CM | POA: Diagnosis present

## 2019-12-31 DIAGNOSIS — Z992 Dependence on renal dialysis: Secondary | ICD-10-CM | POA: Diagnosis not present

## 2019-12-31 DIAGNOSIS — E669 Obesity, unspecified: Secondary | ICD-10-CM | POA: Diagnosis present

## 2019-12-31 DIAGNOSIS — I132 Hypertensive heart and chronic kidney disease with heart failure and with stage 5 chronic kidney disease, or end stage renal disease: Secondary | ICD-10-CM | POA: Diagnosis not present

## 2019-12-31 DIAGNOSIS — Z9889 Other specified postprocedural states: Secondary | ICD-10-CM | POA: Diagnosis not present

## 2019-12-31 DIAGNOSIS — E162 Hypoglycemia, unspecified: Secondary | ICD-10-CM

## 2019-12-31 DIAGNOSIS — E8809 Other disorders of plasma-protein metabolism, not elsewhere classified: Secondary | ICD-10-CM | POA: Diagnosis not present

## 2019-12-31 DIAGNOSIS — Z20822 Contact with and (suspected) exposure to covid-19: Secondary | ICD-10-CM | POA: Diagnosis present

## 2019-12-31 DIAGNOSIS — K7040 Alcoholic hepatic failure without coma: Secondary | ICD-10-CM | POA: Diagnosis not present

## 2019-12-31 DIAGNOSIS — D6489 Other specified anemias: Secondary | ICD-10-CM | POA: Diagnosis present

## 2019-12-31 DIAGNOSIS — Z794 Long term (current) use of insulin: Secondary | ICD-10-CM

## 2019-12-31 DIAGNOSIS — Z9115 Patient's noncompliance with renal dialysis: Secondary | ICD-10-CM | POA: Diagnosis not present

## 2019-12-31 DIAGNOSIS — Z515 Encounter for palliative care: Secondary | ICD-10-CM | POA: Diagnosis not present

## 2019-12-31 DIAGNOSIS — Z86718 Personal history of other venous thrombosis and embolism: Secondary | ICD-10-CM

## 2019-12-31 DIAGNOSIS — Z66 Do not resuscitate: Secondary | ICD-10-CM | POA: Diagnosis not present

## 2019-12-31 DIAGNOSIS — Z87891 Personal history of nicotine dependence: Secondary | ICD-10-CM | POA: Diagnosis not present

## 2019-12-31 DIAGNOSIS — I120 Hypertensive chronic kidney disease with stage 5 chronic kidney disease or end stage renal disease: Secondary | ICD-10-CM | POA: Diagnosis not present

## 2019-12-31 DIAGNOSIS — E7220 Disorder of urea cycle metabolism, unspecified: Secondary | ICD-10-CM

## 2019-12-31 DIAGNOSIS — Z9114 Patient's other noncompliance with medication regimen: Secondary | ICD-10-CM | POA: Diagnosis not present

## 2019-12-31 DIAGNOSIS — Z7189 Other specified counseling: Secondary | ICD-10-CM | POA: Diagnosis not present

## 2019-12-31 DIAGNOSIS — J9611 Chronic respiratory failure with hypoxia: Secondary | ICD-10-CM | POA: Diagnosis not present

## 2019-12-31 DIAGNOSIS — K219 Gastro-esophageal reflux disease without esophagitis: Secondary | ICD-10-CM | POA: Diagnosis present

## 2019-12-31 DIAGNOSIS — R627 Adult failure to thrive: Secondary | ICD-10-CM | POA: Diagnosis present

## 2019-12-31 DIAGNOSIS — G9341 Metabolic encephalopathy: Secondary | ICD-10-CM | POA: Diagnosis present

## 2019-12-31 DIAGNOSIS — J90 Pleural effusion, not elsewhere classified: Secondary | ICD-10-CM | POA: Diagnosis not present

## 2019-12-31 DIAGNOSIS — K7031 Alcoholic cirrhosis of liver with ascites: Secondary | ICD-10-CM | POA: Diagnosis not present

## 2019-12-31 DIAGNOSIS — I5032 Chronic diastolic (congestive) heart failure: Secondary | ICD-10-CM | POA: Diagnosis present

## 2019-12-31 DIAGNOSIS — E785 Hyperlipidemia, unspecified: Secondary | ICD-10-CM | POA: Diagnosis present

## 2019-12-31 DIAGNOSIS — E871 Hypo-osmolality and hyponatremia: Secondary | ICD-10-CM

## 2019-12-31 DIAGNOSIS — I4891 Unspecified atrial fibrillation: Secondary | ICD-10-CM | POA: Diagnosis present

## 2019-12-31 DIAGNOSIS — I350 Nonrheumatic aortic (valve) stenosis: Secondary | ICD-10-CM | POA: Diagnosis not present

## 2019-12-31 DIAGNOSIS — E119 Type 2 diabetes mellitus without complications: Secondary | ICD-10-CM

## 2019-12-31 DIAGNOSIS — K8020 Calculus of gallbladder without cholecystitis without obstruction: Secondary | ICD-10-CM | POA: Diagnosis not present

## 2019-12-31 DIAGNOSIS — E11649 Type 2 diabetes mellitus with hypoglycemia without coma: Secondary | ICD-10-CM | POA: Diagnosis present

## 2019-12-31 DIAGNOSIS — N186 End stage renal disease: Secondary | ICD-10-CM | POA: Diagnosis not present

## 2019-12-31 DIAGNOSIS — I953 Hypotension of hemodialysis: Secondary | ICD-10-CM | POA: Diagnosis not present

## 2019-12-31 DIAGNOSIS — E1122 Type 2 diabetes mellitus with diabetic chronic kidney disease: Secondary | ICD-10-CM | POA: Diagnosis present

## 2019-12-31 DIAGNOSIS — K7200 Acute and subacute hepatic failure without coma: Principal | ICD-10-CM

## 2019-12-31 DIAGNOSIS — E1129 Type 2 diabetes mellitus with other diabetic kidney complication: Secondary | ICD-10-CM | POA: Diagnosis not present

## 2019-12-31 DIAGNOSIS — R188 Other ascites: Secondary | ICD-10-CM | POA: Diagnosis not present

## 2019-12-31 DIAGNOSIS — I10 Essential (primary) hypertension: Secondary | ICD-10-CM | POA: Diagnosis not present

## 2019-12-31 LAB — CBC WITH DIFFERENTIAL/PLATELET
Abs Immature Granulocytes: 0.01 10*3/uL (ref 0.00–0.07)
Basophils Absolute: 0 10*3/uL (ref 0.0–0.1)
Basophils Relative: 1 %
Eosinophils Absolute: 0.2 10*3/uL (ref 0.0–0.5)
Eosinophils Relative: 6 %
HCT: 27.7 % — ABNORMAL LOW (ref 39.0–52.0)
Hemoglobin: 8.3 g/dL — ABNORMAL LOW (ref 13.0–17.0)
Immature Granulocytes: 0 %
Lymphocytes Relative: 6 %
Lymphs Abs: 0.2 10*3/uL — ABNORMAL LOW (ref 0.7–4.0)
MCH: 28.9 pg (ref 26.0–34.0)
MCHC: 30 g/dL (ref 30.0–36.0)
MCV: 96.5 fL (ref 80.0–100.0)
Monocytes Absolute: 0.3 10*3/uL (ref 0.1–1.0)
Monocytes Relative: 7 %
Neutro Abs: 3.3 10*3/uL (ref 1.7–7.7)
Neutrophils Relative %: 80 %
Platelets: 123 10*3/uL — ABNORMAL LOW (ref 150–400)
RBC: 2.87 MIL/uL — ABNORMAL LOW (ref 4.22–5.81)
RDW: 16.9 % — ABNORMAL HIGH (ref 11.5–15.5)
WBC: 4 10*3/uL (ref 4.0–10.5)
nRBC: 0 % (ref 0.0–0.2)

## 2019-12-31 LAB — COMPREHENSIVE METABOLIC PANEL
ALT: 25 U/L (ref 0–44)
AST: 29 U/L (ref 15–41)
Albumin: 3.1 g/dL — ABNORMAL LOW (ref 3.5–5.0)
Alkaline Phosphatase: 174 U/L — ABNORMAL HIGH (ref 38–126)
Anion gap: 12 (ref 5–15)
BUN: 44 mg/dL — ABNORMAL HIGH (ref 8–23)
CO2: 18 mmol/L — ABNORMAL LOW (ref 22–32)
Calcium: 8.5 mg/dL — ABNORMAL LOW (ref 8.9–10.3)
Chloride: 93 mmol/L — ABNORMAL LOW (ref 98–111)
Creatinine, Ser: 4.73 mg/dL — ABNORMAL HIGH (ref 0.61–1.24)
GFR calc Af Amer: 13 mL/min — ABNORMAL LOW (ref >60)
GFR calc non Af Amer: 11 mL/min — ABNORMAL LOW (ref >60)
Glucose, Bld: 102 mg/dL — ABNORMAL HIGH (ref 70–99)
Potassium: 5.7 mmol/L — ABNORMAL HIGH (ref 3.5–5.1)
Sodium: 123 mmol/L — ABNORMAL LOW (ref 135–145)
Total Bilirubin: 0.7 mg/dL (ref 0.3–1.2)
Total Protein: 7.1 g/dL (ref 6.5–8.1)

## 2019-12-31 LAB — CBG MONITORING, ED
Glucose-Capillary: 49 mg/dL — ABNORMAL LOW (ref 70–99)
Glucose-Capillary: 51 mg/dL — ABNORMAL LOW (ref 70–99)
Glucose-Capillary: 104 mg/dL — ABNORMAL HIGH (ref 70–99)

## 2019-12-31 LAB — AMMONIA: Ammonia: 86 umol/L — ABNORMAL HIGH (ref 9–35)

## 2019-12-31 LAB — LIPASE, BLOOD: Lipase: 23 U/L (ref 11–51)

## 2019-12-31 MED ORDER — DEXTROSE 50 % IV SOLN
INTRAVENOUS | Status: AC
  Administered 2019-12-31: 50 mL
  Filled 2019-12-31: qty 50

## 2019-12-31 MED ORDER — HEPARIN SODIUM (PORCINE) 5000 UNIT/ML IJ SOLN
5000.0000 [IU] | Freq: Three times a day (TID) | INTRAMUSCULAR | Status: DC
  Administered 2019-12-31: 5000 [IU] via SUBCUTANEOUS
  Filled 2019-12-31: qty 1

## 2019-12-31 MED ORDER — SODIUM CHLORIDE 0.9 % IV SOLN: INTRAVENOUS | Status: DC

## 2019-12-31 MED ORDER — LACTULOSE 10 GM/15ML PO SOLN
30.0000 g | Freq: Once | ORAL | Status: AC
  Administered 2019-12-31: 30 g via ORAL
  Filled 2019-12-31 (×2): qty 60

## 2019-12-31 MED ORDER — DEXTROSE 50 % IV SOLN: 1.0000 | Freq: Once | INTRAVENOUS | Status: AC

## 2019-12-31 MED ORDER — FENTANYL CITRATE (PF) 100 MCG/2ML IJ SOLN
50.0000 ug | Freq: Once | INTRAMUSCULAR | Status: AC
  Administered 2019-12-31: 50 ug via INTRAVENOUS
  Filled 2019-12-31: qty 2

## 2019-12-31 MED ORDER — ONDANSETRON HCL 4 MG/2ML IJ SOLN
4.0000 mg | Freq: Once | INTRAMUSCULAR | Status: AC
  Administered 2019-12-31: 4 mg via INTRAVENOUS
  Filled 2019-12-31: qty 2

## 2019-12-31 NOTE — H&P (Signed)
History and Physical  Jeffrey [name] DJM:[id number] DOB: 1945-07-28 DOA: 12/31/2019  Referring physician: Anselmo Rod, MD PCP: Monico Blitz, MD  Patient coming from: Home  Chief Complaint: Abdominal pain  HPI: Jeffrey [name] is a 74 y.o. male with medical history significant for alcoholic liver cirrhosis, ESRD (MWF), hypertension, diabetes mellitus type 2, hyperlipidemia, transfusion dependent anemia, portal hypertensive gastropathy, esophageal varices who presents to the ED due to 2 week onset of intermittent right upper and lower abdominal pain which became worse in the past 4 days. Patient was unable to provide history possibly due to being lethargic (though easily arousable). He only kept repeating that the pain was 7/10 for all questions related to the abdominal pain. History was obtained from the ED physician and ED medical  Record. Per report, Pain was sharp in nature and he denied increased abdominal swelling beyond his baseline. He was seen by his PCP (Dr Manuella Ghazi) 2 days ago who asked him to go to the ED for further evaluation but he was hoping that the pain will subside and today, pain worsened and wife noted that patient was more confused today, so he presented to the ED. There was no report of fever, chills, chest pain, SOB, N/V/D.  Of note, patient was reported to have missed his dialysis on Friday, last dialysis was on Wednesday (12/27/2019).  ED Course:  In the emergency department, he was hemodynamically stable, work-up in the ED showed normocytic anemia, hyponatremia, hyperkalemia, BUN/creatinine 44/4.73 (this urine from 2.27-3.2 on about 2 weeks ago).  Albumin 3.1, ammonia 86.  CT abdomen pelvis without contrast showed moderate left and small right pleural effusion with basilar airspace disease greatest on the left.  Cirrhotic liver with signs of patella potential noted.  Small volume ascites mainly in the left hemiabdomen with suspicion for loculation was also noted.  IV NS 1 L was given.   IV fentanyl, Zofran and lactulose were also given.  Hospitalist was asked to admit.  For further evaluation and management.  Review of Systems: Constitutional: Positive for fatigue.  Negative for chills and fever.  HENT: Negative for ear pain and sore throat.   Eyes: Negative for pain and visual disturbance.  Respiratory: Negative for cough, chest tightness and shortness of breath.   Cardiovascular: Negative for chest pain and palpitations.  Gastrointestinal: Positive for abdominal.  Negative for vomiting.  Endocrine: Negative for polyphagia and polyuria.  Genitourinary: Negative for decreased urine volume, dysuria Musculoskeletal: Negative for arthralgias and back pain.  Skin: Negative for color change and rash.  Allergic/Immunologic: Negative for immunocompromised state.  Neurological: Negative for tremors, syncope, speech difficulty, weakness, light-headedness and headaches.  Hematological: Does not bruise/bleed easily.  All other systems reviewed and are negative   Past Medical History:  Diagnosis Date  . A-fib [name])    when initially stating dialysis january 2021 at [facility] per spouse; never heard anything else about it  . Alcoholic cirrhosis ([place])    patient reports completing hep a and b vaccines in 2006  . Anemia    has had 3 units of prbcs 2013  . Anxiety   . Arthritis   . Asymptomatic gallstones    Ultrasound in 2006  . C. difficile colitis   . Cholelithiasis   . Chronic kidney disease    Dialysis M/W/F/Sa  . Depression   . Diabetes ([place])   . Duodenal ulcer with hemorrhage    per patient in 2006 or 2007, records have been requested  . Dyspnea  low oxygen level -  has O2 2 L all day  . 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 GERD (gastroesophageal reflux disease)   . Heart burn   . 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 HOH (hard of hearing)   . Hypertension   . Liver cirrhosis ([place])   . Splenomegaly    Ultrasound in 2006   Past Surgical  History:  Procedure Laterality Date  . AGILE CAPSULE N/A 09/20/2019   Procedure: AGILE CAPSULE;  Surgeon: Daneil Dolin, MD;  [facility];  Service: Endoscopy;  Laterality: N/A;  . APPENDECTOMY    . AV FISTULA PLACEMENT Left 08/17/2019   Procedure: ARTERIOVENOUS (AV) FISTULA CREATION VERSES ARTERIOVENOUS GRAFT;  Surgeon: [name], MD;  Location: [place];  Service: Vascular;  Laterality: Left;  . BASCILIC VEIN TRANSPOSITION Left 09/28/2019   Procedure: LEFT SECOND STAGE BASCILIC VEIN TRANSPOSITION;  Surgeon: [name], MD;  Location: [place];  Service: Vascular;  Laterality: Left;  . BIOPSY  02/03/2018   Procedure: BIOPSY;  Surgeon: Daneil Dolin, MD;  [facility];  Service: Endoscopy;;  bx of gastric polyps  . BIOPSY  09/19/2019   Procedure: BIOPSY;  Surgeon: Danie Binder, MD;  [facility];  Service: Endoscopy;;  . CATARACT EXTRACTION Right   . COLONOSCOPY  03/10/2005   Rectal polyp as described above, removed with snare. Left sided  diverticula. The remainder of the colonic mucosa appeared normal. Inflammed polyp on path.  . COLONOSCOPY  04/1999   Dr. Thea Silversmith polyps removed,  Path showed hyperplastic  . COLONOSCOPY  10/2015   Dr. Britta Mccreedy: diverticulosis, single sessile tubular adenoma 3-79mm in size removed from descending colon.   . COLONOSCOPY WITH ESOPHAGOGASTRODUODENOSCOPY (EGD)  03/31/2012   SAY:TKZSWFU AVMs. Colonic diverticulosis. tubular adenoma colon  . COLONOSCOPY WITH ESOPHAGOGASTRODUODENOSCOPY (EGD)  06/2019   FORSYTH: small esophageal varices without high risk stigmata, single large AVM on lesser curvature in gastric body s/p APC ablation, gastric antral and duodenal bulb polyposis. No significant source to explain transfusion dependent anemia. Colonoscopy with portal colopathy and diffuse edema, changes of Cdiff colitis on colonoscopy  . ESOPHAGEAL BANDING N/A 02/03/2018   Procedure: ESOPHAGEAL BANDING;  Surgeon: Daneil Dolin, MD;   [facility];  Service: Endoscopy;  Laterality: N/A;  . ESOPHAGOGASTRODUODENOSCOPY  01/15/2005   Three columns grade 1 to 2 esophageal varices, otherwise normal esophageal mucosa.  Esophagus was not manipulated otherwise./Nodularity of the antrum with overlying erosions, nonspecific finding. Path showed rare H.pylori  . ESOPHAGOGASTRODUODENOSCOPY  09/2008   Dr. Gaylene Brooks columns of grade 2-3 esoph varices, only one column was prominent. Portal gastropathy, multiple gastrc polyps at antrum, two were 2cm with black eschar, bulbar polyps, bulbar erosions  . ESOPHAGOGASTRODUODENOSCOPY  11/2004   Dr. Brantley Stage 3 esoph varices  . ESOPHAGOGASTRODUODENOSCOPY  03/31/2012   RMR: 4 columns(3-GR 2, 1-GR1) non-bleeding esophageal varices, portal gastropathy, small HH, early GAVE, multiple gastric polyps   . ESOPHAGOGASTRODUODENOSCOPY (EGD) WITH PROPOFOL N/A 02/03/2018   Dr. Gala Romney: Esophageal varices, 3 columns grade 1-2.  Portal hypertensive gastropathy.  Multiple gastric polyps, biopsy consistent with hyperplastic.  [name] ESOPHAGOGASTRODUODENOSCOPY (EGD) WITH PROPOFOL N/A 09/19/2019   Fields: grade I and II esophageal varcies, mild portal hypertensive gastropathy, moderate gastritis but no H. pylori, single hyperplastic gastric polyp removed, obvious source for melena/transfusion dependent anemia not identified, may be due to friable gastric and duodenal mucosa in the setting of  portal hypertension  . GASTRIC VARICES BANDING  03/31/2012   Procedure: GASTRIC VARICES BANDING;  Surgeon: Daneil Dolin, MD;  [facility];  Service: Endoscopy;  Laterality: N/A;  . GIVENS CAPSULE STUDY N/A 09/21/2019   Poor study with a lot of debris obstructing much of the view of the first approximate 3 hours out of 6 hours.  No obvious source of bleeding identified.  Sequela of bleeding and old blood seen in the form of"whisps" of blood, blood flecks mostly toward the end of the study.  [name] POLYPECTOMY   09/19/2019   Procedure: POLYPECTOMY;  Surgeon: Danie Binder, MD;  [facility];  Service: Endoscopy;;  . UMBILICAL HERNIA REPAIR  2017   exploratory laparotomy, abdominal washout    Social History:  reports that he quit smoking about 33 years ago. His smoking use included cigarettes. He has a 50.00 pack-year smoking history. He has never used smokeless tobacco. He reports that he does not drink alcohol and does not use drugs.   Allergies  Allergen Reactions  . Chlorhexidine   . Tape Itching and Rash    Family History  Problem Relation Age of Onset  . Colon cancer Father 43       deceased age 84  . Breast cancer Sister        deceased  . Diabetes Sister   . Stroke Mother   . Healthy Son   . Healthy Daughter   . Lung cancer Neg Hx   . Ovarian cancer Neg Hx      Prior to Admission medications   Medication Sig Start Date End Date Taking? Authorizing Provider  Ascorbic Acid (VITAMIN C) 1000 MG tablet Take 1,000 mg by mouth 2 (two) times daily.    [provider]  furosemide (LASIX) 80 MG tablet Take 80 mg by mouth in the morning.  08/07/19   [provider]  gabapentin (NEURONTIN) 100 MG capsule TAKE 1 CAPSULE BY MOUTH AT BEDTIME 10/05/19   Waynetta Sandy, MD  HUMALOG KWIKPEN 100 UNIT/ML KwikPen as needed. Sliding scale 11/05/19   [provider]  HYDROcodone-acetaminophen (NORCO) 10-325 MG tablet Take 1 tablet by mouth every 6 (six) hours as needed for moderate pain. Patient taking differently: Take 1 tablet by mouth as needed for moderate pain.  09/28/19   Rhyne, Hulen Shouts, PA-C  insulin degludec (TRESIBA FLEXTOUCH) 200 UNIT/ML FlexTouch Pen Inject 120 Units into the skin at bedtime. When blood sugars are up    [provider]  iron polysaccharides (NIFEREX) 150 MG capsule Take 150 mg by mouth at bedtime.     [provider]  lactulose (CHRONULAC) 10 GM/15ML solution Take 45 mLs (30 g total) by mouth 2 (two) times  daily. 12/17/19   Rai, Vernelle Emerald, MD  metoprolol succinate (TOPROL-XL) 25 MG 24 hr tablet Take 25 mg by mouth daily. Only on Monday ,Wednesday and Friday 11/08/19   [provider]  milk thistle 175 MG tablet Take 175 mg by mouth 3 (three) times daily.     [provider]  Multiple Vitamin (MULTIVITAMIN WITH MINERALS) TABS tablet Take 1 tablet by mouth in the morning.     [provider]  omega-3 acid ethyl esters (LOVAZA) 1 g capsule Take 2 g by mouth 2 (two) times daily.  04/07/19   [provider]  omeprazole (PRILOSEC) 40 MG capsule Take 40 mg by mouth in the morning.     [provider]  pravastatin (  PRAVACHOL) 20 MG tablet Take 20 mg by mouth in the morning.     [provider]  Probiotic Product (PROBIOTIC DAILY PO) Take 1 tablet by mouth in the morning.     [provider]  rifaximin (XIFAXAN) 550 MG TABS tablet Take 1 tablet (550 mg total) by mouth 2 (two) times daily. 01/13/19   Mahala Menghini, PA-C  spironolactone (ALDACTONE) 50 MG tablet Take 50 mg by mouth in the morning.     [provider]  tamsulosin (FLOMAX) 0.4 MG CAPS capsule Take 0.4 mg by mouth every evening.    [provider]  thiamine 100 MG tablet Take 100 mg by mouth in the morning. Vitamin B-1    [provider]  tiZANidine (ZANAFLEX) 2 MG tablet Take 2 mg by mouth as needed for muscle spasms.     [provider]  triamcinolone cream (KENALOG) 0.1 % Apply 1 application topically 2 (two) times daily as needed (dry skin Itching).  12/30/17   [provider]  venlafaxine (EFFEXOR) 75 MG tablet Take 75 mg by mouth 3 (three) times daily with meals.     [provider]  vitamin B-12 (CYANOCOBALAMIN) 1000 MCG tablet Take 1,000 mcg by mouth daily.    [provider]    Physical Exam: BP (!) 140/55   Pulse 82   Temp 98.3 F (36.8 C) (Oral)   Resp 19   Ht 5\' 7"  (1.702 m)   Wt (!) 108.2 kg   SpO2 98%    BMI 37.36 kg/m   . General: 74 y.o. year-old male obese, somnolent but easily arousable.  Ill appearing. [name] HEENT: Normocephalic, atraumatic . Neck: Supple, trachea medial . Cardiovascular: Regular rate and rhythm with no rubs or gallops.  No thyromegaly or JVD noted.  No lower extremity edema. 2/4 pulses in all 4 extremities. [name] Respiratory: Clear to auscultation with no wheezes or rales. Good inspiratory effort. . Abdomen: Tender to palpation of right upper and right lower quadrant.  No guarding.  Soft, normal bowel sounds x4 quadrants. . Muskuloskeletal: No cyanosis, clubbing or edema noted bilaterally . Neuro: CN II-XII intact, strength, sensation, reflexes . Skin: No ulcerative lesions noted or rashes . Psychiatry: Judgement and insight appear normal. Mood is appropriate for condition and setting          Labs on Admission:  Basic Metabolic Panel: Recent Labs  Lab 12/31/19 1909  NA 123*  K 5.7*  CL 93*  CO2 18*  GLUCOSE 102*  BUN 44*  CREATININE 4.73*  CALCIUM 8.5*   Liver Function Tests: Recent Labs  Lab 12/31/19 1909  AST 29  ALT 25  ALKPHOS 174*  BILITOT 0.7  PROT 7.1  ALBUMIN 3.1*   Recent Labs  Lab 12/31/19 1909  LIPASE 23   Recent Labs  Lab 12/31/19 1909  AMMONIA 86*   CBC: Recent Labs  Lab 12/31/19 1909  WBC 4.0  NEUTROABS 3.3  HGB 8.3*  HCT 27.7*  MCV 96.5  PLT 123*   Cardiac Enzymes: No results for input(s): CKTOTAL, CKMB, CKMBINDEX, TROPONINI in the last 168 hours.  BNP (last 3 results) No results for input(s): BNP in the last 8760 hours.  ProBNP (last 3 results) No results for input(s): PROBNP in the last 8760 hours.  CBG: Recent Labs  Lab 12/31/19 1745 12/31/19 1825 12/31/19 1909  GLUCAP 49* 51* 104*    Radiological Exams on Admission: CT ABDOMEN PELVIS WO CONTRAST  Result Date: 12/31/2019 CLINICAL DATA:  Abdominal distension, abdominal pain for 2 weeks. EXAM: CT ABDOMEN AND PELVIS WITHOUT CONTRAST TECHNIQUE:  Multidetector CT imaging of the abdomen and pelvis was performed following the standard protocol without IV contrast. COMPARISON:  January 10, 2016 FINDINGS: Lower chest: Moderate LEFT and small RIGHT pleural effusion with basilar airspace disease also greatest on the LEFT. Central venous access device terminates at the caval to atrial junction incompletely imaged. No pericardial fluid. Hepatobiliary: Heterogeneity of the RIGHT hepatic lobe showing linear and branching appearance grossly similar to the study of January 10, 2016. Frankly cirrhotic liver. Cholelithiasis. Gallbladder is collapsed. There is generalized stranding throughout the abdomen nonspecific to the gallbladder. Focal low attenuation in the RIGHT hemi liver on image 15 of series 2 2.5 x 3.4 cm. Pancreas: Pancreas is normal without ductal dilation, gross inflammation or contour change from previous imaging study. Spleen: Spleen remains enlarged. Signs of portosystemic collaterals in the upper abdomen. This includes splenorenal shunting. Adrenals/Urinary Tract: Adrenal glands grossly normal though LEFT adrenal is partially obscured by retroperitoneal venous collaterals. No hydronephrosis. No perinephric stranding. Urinary bladder collapsed with Foley catheter in situ. No nephrolithiasis. No ureteral calculi. Stomach/Bowel: Stool throughout the colon. Generalized edema in the abdominal mesenteries. Small volume ascites mainly in the LEFT hemiabdomen. No perienteric or pericolonic stranding. Signs of colonic diverticulosis, mild. Vascular/Lymphatic: Calcified atheromatous plaque in the abdominal aorta. No aneurysmal dilation. No adenopathy in the retroperitoneum. No adenopathy in the pelvis. 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 Reproductive: Prostate grossly normal by CT. Other: Body wall edema and skin thickening over a lower abdominal pannus. Skin thickening  was present previously. Interval abdominal wall reconstruction and repair of umbilical hernia since the prior study. Generalized body wall edema. Musculoskeletal: No acute musculoskeletal process. Spinal degenerative changes. IMPRESSION: 1. Moderate LEFT and small RIGHT pleural effusion with basilar airspace disease also greatest on the LEFT. Airspace disease likely volume loss associated with effusion. 2. Calcification of the periphery of the SMV and portal venous system can be seen in the setting of chronic thrombosis. CT or multiphase MRI may be helpful as clinically warranted. 3. Cirrhotic liver with signs of portal hypertension. Generalized edema could be also related to liver disease. Focal area of low attenuation in the RIGHT hemi liver may represent confluent fibrosis the given severity of cirrhotic changes however, would suggest follow-up with multiphase CT or MRI as dictated by the patient's renal function. 4. Small volume ascites mainly in the LEFT hemiabdomen. The location of the ascites raising the question of some loculation. No peripheral rind is suggested on noncontrast imaging. Correlate with any clinical symptoms of or history of SBP. 5. Cholelithiasis with collapsed gallbladder and with generalized stranding in the abdomen in the setting of portal hypertension. 6. Interval abdominal wall reconstruction and repair of umbilical hernia since the prior study. 7. Body wall edema and skin thickening over a lower abdominal pannus. Skin thickening was present previously. Correlate with any clinical evidence of cellulitis. 8. Aortic atherosclerosis. These results were called by telephone at the time of interpretation on 12/31/2019 at 7:44 pm to provider JULIE IDOL , who verbally acknowledged these results. Aortic Atherosclerosis (ICD10-I70.0). Electronically Signed   By: Zetta Bills M.D.   On: 12/31/2019 19:38    EKG: I independently viewed the EKG done and my findings are as followed: Sinus rhythm at  84 bpm with no acute ST/T wave changes.  Assessment/Plan Present on Admission: . (Resolved) Hepatic  encephalopathy ([place]) . Alcoholic cirrhosis of liver with ascites ([place]) . Hyperkalemia . Hyponatremia . 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[place])  Principal Problem:   Acute hepatic encephalopathy Active Problems:   Alcoholic cirrhosis of liver with ascites (H[facility])   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HCC)   Hyperkalemia   Hyponatremia   Pleural effusion   Hypoglycemia   Hyperammonemia (HCC)   Obesity (BMI 30-39.9)   Hypoalbuminemia  Acute hepatic encephalopathy in the setting of hyperammonemia Ammonia level 86, lactulose was given in the ED, we shall continue with this and titrate to 2-3 loose bowel movements daily Continue rifaximin Continue to monitor ammonia with morning labs  Moderate left and right pleural effusion CT abdomen pelvis without contrast showed moderate left and small right pleural effusion with basilar airspace disease greatest on the left.  Interventional radiologist will be consulted for thoracentesis in the morning Pleural fluid analysis will be checked status post thoracentesis  ESRD on HD (MWF) Patient missed dialysis on Friday Last dialysis was on Wednesday (12/27/2019). Nephrology will be consulted for maintenance dialysis  Hypoglycemia Patient presents with blood glucose in the high 20s, and IV D50 x1 was given Patient was started on D5 W at 75 with blood glucose stable ranging in the 60s, this was changed to D10W Continue Accu-Cheks and hypoglycemic protocol Continue to monitor blood glucose level and treat accordingly  Type 2 Diabetes Mellitus Hold off on insulin sliding scale at this time due to hypoglycemia  Hyponatremia Na 123, possibly due to poor solute intake and missing hemodialysis Nephrology consulted for dialysis in the morning  Hyperkalemia K+ 5.7, no EKG changes This will be corrected with hemodialysis  History of alcoholic cirrhosis  of liver with ascites CT abdomen pelvis without contrast showed cirrhotic liver with signs of portal hypertension Small volume ascites in the left hemiabdomen with questionable loculation Consider IR evaluation and management/recommendation  Hypoalbuminemia  Alb 3.1, possibly related to patient's history of cirrhosis team Consider protein supplement when patient is able to tolerate p.o. diet discussed  Class II obesity (BMI 31.36) Patient will be counseled on diet and exercise when more stable   DVT prophylaxis: SCDs  Code Status: Full code  Family Communication: None at bedside  Disposition Plan:  Patient is from:                        home Anticipated DC to:                   SNF or family members home Anticipated DC date:               2-3 days Anticipated DC barriers:         Unstable to discharge at this time due to hepatic encephalopathy and electrolyte abnormalities   Consults called: Nephrology, interventional radiologist  Admission status: Inpatient    Bernadette Hoit MD [facility]  If 7PM-7AM, please contact night-coverage [email or web address]  01/01/2020, 1:03 AM

## 2019-12-31 NOTE — ED Notes (Addendum)
Pt c/o pain- Dr Regenia Skeeter made aware- on way to see pt -pt and family informed of this.

## 2019-12-31 NOTE — ED Provider Notes (Signed)
[facility] EMERGENCY DEPARTMENT Provider Note   CSN: [id number] Arrival date & time: 12/31/19  1542     History Chief Complaint  Patient presents with  . Abdominal Pain    Jeffrey [name] is a 74 y.o. male with ESRD on dialysis who missed Fridays treatment, chronic anemia, diabetes, alcohol induced cirrhosis along with recent admission for acute hepatic encephalopathy, GERD, hypertension and known gallstones presenting with a 2-week history of right upper and lower abdominal pain which has been waxing and waning in character but has become severe over the past 4 days.  His pain is described as sharp and radiates into his right flank region.  He denies fevers, also no nausea, vomiting or diarrhea.  He denies any increased abdominal distention beyond his baseline.  He has a chronic indwelling Foley catheter.  He was seen by his PCP [name] 2 days ago who encouraged him to come to the emergency department, however he was hopeful symptoms would resolve but worsened today so he presents here.  He was placed on Cipro on Friday for presumptive possible infection in his gallbladder.  The history is provided by the patient and the spouse.       Past Medical History:  Diagnosis Date  . A-fib [facility])    when initially stating dialysis january 2021 at [facility] per spouse; never heard anything else about it  . Alcoholic cirrhosis ([place])    patient reports completing hep a and b vaccines in 2006  . Anemia    has had 3 units of prbcs 2013  . Anxiety   . Arthritis   . Asymptomatic gallstones    Ultrasound in 2006  . C. difficile colitis   . Cholelithiasis   . Chronic kidney disease    Dialysis M/W/F/Sa  . Depression   . Diabetes ([place])   . Duodenal ulcer with hemorrhage    per patient in 2006 or 2007, records have been requested  . Dyspnea    low oxygen level -  has O2 2 L all day  . 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 GERD (gastroesophageal reflux disease)   . Heart burn   .  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 HOH (hard of hearing)   . Hypertension   . Liver cirrhosis ([place])   . Splenomegaly    Ultrasound in 2006    Patient Active Problem List   Diagnosis Date Noted  . Nosebleed   . Goals of care, counseling/discussion   . Palliative care by specialist   . Hepatic encephalopathy ([name]) 12/13/2019  . Acute metabolic encephalopathy 79/48/0165  . Encephalopathy acute   . Hyperkalemia   . Hyponatremia   . Anemia 11/16/2019  . Acute blood loss anemia 09/20/2019  . Gastrointestinal hemorrhage   . Acute on chronic anemia 09/18/2019  . Generalized weakness 09/18/2019  . DM2 (diabetes mellitus, type 2) ([place]) 09/18/2019  . 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[place]) 09/18/2019  . Enteritis due to Clostridium difficile 08/31/2019  . Edema 04/11/2019  . Alcoholic cirrhosis ([place]) 53/74/8270  . Type 2 diabetes mellitus with stage 3 chronic kidney disease, with long-term current use of insulin ([place]) 10/20/2017  . Essential hypertension, benign 10/20/2017  . Morbid obesity ([place]) 10/20/2017  . Mixed hyperlipidemia 10/20/2017  . Ascites   . SBP (spontaneous bacterial peritonitis) ([place]) 01/12/2016  . Shigella dysenteriae 01/11/2016  . Diarrhea 01/10/2016  . Cirrhosis ([place]) 09/05/2012  . Alcoholic cirrhosis of liver  with ascites ([place]) 03/09/2012  . Heme positive stool 03/09/2012  . Anemia due to chronic blood loss 03/09/2012  . Pancytopenia ([place]) 03/09/2012  . FH: colon cancer 03/09/2012  . Esophageal varices in alcoholic cirrhosis ([place]) 93/90/3009    Past Surgical History:  Procedure Laterality Date  . AGILE CAPSULE N/A 09/20/2019   Procedure: AGILE CAPSULE;  Surgeon: Daneil Dolin, MD;  [facility];  Service: Endoscopy;  Laterality: N/A;  . APPENDECTOMY    . AV FISTULA PLACEMENT Left 08/17/2019   Procedure: ARTERIOVENOUS (AV) FISTULA CREATION VERSES ARTERIOVENOUS GRAFT;  Surgeon: [name], MD;  Location: [place];  Service: Vascular;   Laterality: Left;  . BASCILIC VEIN TRANSPOSITION Left 09/28/2019   Procedure: LEFT SECOND STAGE BASCILIC VEIN TRANSPOSITION;  Surgeon: [name], MD;  Location: [place];  Service: Vascular;  Laterality: Left;  . BIOPSY  02/03/2018   Procedure: BIOPSY;  Surgeon: Daneil Dolin, MD;  [facility];  Service: Endoscopy;;  bx of gastric polyps  . BIOPSY  09/19/2019   Procedure: BIOPSY;  Surgeon: Danie Binder, MD;  [facility];  Service: Endoscopy;;  . CATARACT EXTRACTION Right   . COLONOSCOPY  03/10/2005   Rectal polyp as described above, removed with snare. Left sided  diverticula. The remainder of the colonic mucosa appeared normal. Inflammed polyp on path.  . COLONOSCOPY  04/1999   Dr. Thea Silversmith polyps removed,  Path showed hyperplastic  . COLONOSCOPY  10/2015   Dr. Britta Mccreedy: diverticulosis, single sessile tubular adenoma 3-28mm in size removed from descending colon.   . COLONOSCOPY WITH ESOPHAGOGASTRODUODENOSCOPY (EGD)  03/31/2012   [id number] AVMs. Colonic diverticulosis. tubular adenoma colon  . COLONOSCOPY WITH ESOPHAGOGASTRODUODENOSCOPY (EGD)  06/2019   FORSYTH: small esophageal varices without high risk stigmata, single large AVM on lesser curvature in gastric body s/p APC ablation, gastric antral and duodenal bulb polyposis. No significant source to explain transfusion dependent anemia. Colonoscopy with portal colopathy and diffuse edema, changes of Cdiff colitis on colonoscopy  . ESOPHAGEAL BANDING N/A 02/03/2018   Procedure: ESOPHAGEAL BANDING;  Surgeon: Daneil Dolin, MD;  [facility];  Service: Endoscopy;  Laterality: N/A;  . ESOPHAGOGASTRODUODENOSCOPY  01/15/2005   Three columns grade 1 to 2 esophageal varices, otherwise normal esophageal mucosa.  Esophagus was not manipulated otherwise./Nodularity of the antrum with overlying erosions, nonspecific finding. Path showed rare H.pylori  . ESOPHAGOGASTRODUODENOSCOPY  09/2008   Dr. Gaylene Brooks  columns of grade 2-3 esoph varices, only one column was prominent. Portal gastropathy, multiple gastrc polyps at antrum, two were 2cm with black eschar, bulbar polyps, bulbar erosions  . ESOPHAGOGASTRODUODENOSCOPY  11/2004   Dr. Brantley Stage 3 esoph varices  . ESOPHAGOGASTRODUODENOSCOPY  03/31/2012   RMR: 4 columns(3-GR 2, 1-GR1) non-bleeding esophageal varices, portal gastropathy, small HH, early GAVE, multiple gastric polyps   . ESOPHAGOGASTRODUODENOSCOPY (EGD) WITH PROPOFOL N/A 02/03/2018   Dr. Gala Romney: Esophageal varices, 3 columns grade 1-2.  Portal hypertensive gastropathy.  Multiple gastric polyps, biopsy consistent with hyperplastic.  [name] ESOPHAGOGASTRODUODENOSCOPY (EGD) WITH PROPOFOL N/A 09/19/2019   Fields: grade I and II esophageal varcies, mild portal hypertensive gastropathy, moderate gastritis but no H. pylori, single hyperplastic gastric polyp removed, obvious source for melena/transfusion dependent anemia not identified, may be due to friable gastric and duodenal mucosa in the setting of portal hypertension  . GASTRIC VARICES BANDING  03/31/2012   Procedure: GASTRIC VARICES BANDING;  Surgeon: Daneil Dolin, MD;  [facility];  Service: Endoscopy;  Laterality: N/A;  . GIVENS CAPSULE STUDY N/A 09/21/2019   Poor study with a lot of debris obstructing much of the view of the first approximate 3 hours out of 6 hours.  No obvious source of bleeding identified.  Sequela of bleeding and old blood seen in the form of"whisps" of blood, blood flecks mostly toward the end of the study.  [name] POLYPECTOMY  09/19/2019   Procedure: POLYPECTOMY;  Surgeon: Danie Binder, MD;  [facility];  Service: Endoscopy;;  . UMBILICAL HERNIA REPAIR  2017   exploratory laparotomy, abdominal washout       Family History  Problem Relation Age of Onset  . Colon cancer Father 52       deceased age 61  . Breast cancer Sister        deceased  . Diabetes Sister   . Stroke Mother   . Healthy  Son   . Healthy Daughter   . Lung cancer Neg Hx   . Ovarian cancer Neg Hx     Social History   Tobacco Use  . Smoking status: Former Smoker    Packs/day: 2.00    Years: 25.00    Pack years: 50.00    Types: Cigarettes    Quit date: 01/28/1986    Years since quitting: 33.9  . Smokeless tobacco: Never Used  Vaping Use  . Vaping Use: Never used  Substance Use Topics  . Alcohol use: No    Comment: quit in 05/2013  . Drug use: No    Home Medications Prior to Admission medications   Medication Sig Start Date End Date Taking? Authorizing Provider  Ascorbic Acid (VITAMIN C) 1000 MG tablet Take 1,000 mg by mouth 2 (two) times daily.    [provider]  furosemide (LASIX) 80 MG tablet Take 80 mg by mouth in the morning.  08/07/19   [provider]  gabapentin (NEURONTIN) 100 MG capsule TAKE 1 CAPSULE BY MOUTH AT BEDTIME 10/05/19   Waynetta Sandy, MD  HUMALOG KWIKPEN 100 UNIT/ML KwikPen as needed. Sliding scale 11/05/19   [provider]  HYDROcodone-acetaminophen (NORCO) 10-325 MG tablet Take 1 tablet by mouth every 6 (six) hours as needed for moderate pain. Patient taking differently: Take 1 tablet by mouth as needed for moderate pain.  09/28/19   Rhyne, Hulen Shouts, PA-C  insulin degludec (TRESIBA FLEXTOUCH) 200 UNIT/ML FlexTouch Pen Inject 120 Units into the skin at bedtime. When blood sugars are up    [provider]  iron polysaccharides (NIFEREX) 150 MG capsule Take 150 mg by mouth at bedtime.     [provider]  lactulose (CHRONULAC) 10 GM/15ML solution Take 45 mLs (30 g total) by mouth 2 (two) times daily. 12/17/19   Rai, Vernelle Emerald, MD  metoprolol succinate (TOPROL-XL) 25 MG 24 hr tablet Take 25 mg by mouth daily. Only on Monday ,Wednesday and Friday 11/08/19   [provider]  milk thistle 175 MG tablet Take 175 mg by mouth 3 (three) times daily.     [provider]  Multiple Vitamin (MULTIVITAMIN WITH MINERALS) TABS  tablet Take 1 tablet by mouth in the morning.     [provider]  omega-3 acid ethyl esters (LOVAZA) 1 g capsule Take 2 g by mouth 2 (two) times daily.  04/07/19   [provider]  omeprazole (PRILOSEC) 40 MG capsule Take 40 mg by mouth in the morning.     [provider]  pravastatin (PRAVACHOL) 20 MG  tablet Take 20 mg by mouth in the morning.     [provider]  Probiotic Product (PROBIOTIC DAILY PO) Take 1 tablet by mouth in the morning.     [provider]  rifaximin (XIFAXAN) 550 MG TABS tablet Take 1 tablet (550 mg total) by mouth 2 (two) times daily. 01/13/19   Mahala Menghini, PA-C  spironolactone (ALDACTONE) 50 MG tablet Take 50 mg by mouth in the morning.     [provider]  tamsulosin (FLOMAX) 0.4 MG CAPS capsule Take 0.4 mg by mouth every evening.    [provider]  thiamine 100 MG tablet Take 100 mg by mouth in the morning. Vitamin B-1    [provider]  tiZANidine (ZANAFLEX) 2 MG tablet Take 2 mg by mouth as needed for muscle spasms.     [provider]  triamcinolone cream (KENALOG) 0.1 % Apply 1 application topically 2 (two) times daily as needed (dry skin Itching).  12/30/17   [provider]  venlafaxine (EFFEXOR) 75 MG tablet Take 75 mg by mouth 3 (three) times daily with meals.     [provider]  vitamin B-12 (CYANOCOBALAMIN) 1000 MCG tablet Take 1,000 mcg by mouth daily.    [provider]    Allergies    Chlorhexidine and Tape  Review of Systems   Review of Systems  Constitutional: Positive for fatigue. Negative for fever.  HENT: Negative for congestion and sore throat.   Eyes: Negative.   Respiratory: Negative for chest tightness and shortness of breath.        No increased shortness of breath beyond baseline.  Cardiovascular: Negative for chest pain.  Gastrointestinal: Positive for abdominal pain. Negative for nausea and vomiting.  Genitourinary: Negative.    Musculoskeletal: Negative for arthralgias, joint swelling and neck pain.  Skin: Negative.  Negative for rash and wound.  Neurological: Negative for dizziness, weakness, light-headedness, numbness and headaches.  Psychiatric/Behavioral: Negative.     Physical Exam Updated Vital Signs BP (!) 177/77   Pulse 88   Temp 98.3 F (36.8 C) (Oral)   Resp 21   Ht 5\' 7"  (1.702 m)   Wt (!) 108.2 kg   SpO2 100%   BMI 37.36 kg/m   Physical Exam Vitals and nursing note reviewed.  Constitutional:      Appearance: He is well-developed. He is obese. He is ill-appearing.  HENT:     Head: Normocephalic and atraumatic.  Eyes:     Conjunctiva/sclera: Conjunctivae normal.  Cardiovascular:     Rate and Rhythm: Normal rate and regular rhythm.     Heart sounds: Normal heart sounds.  Pulmonary:     Effort: Pulmonary effort is normal.     Breath sounds: Normal breath sounds. No wheezing.  Abdominal:     General: Abdomen is protuberant. Bowel sounds are normal.     Palpations: Abdomen is soft. There is no pulsatile mass.     Tenderness: There is abdominal tenderness in the right upper quadrant and right lower quadrant. There is no guarding. Negative signs include Murphy's sign.  Musculoskeletal:        General: Normal range of motion.     Cervical back: Normal range of motion.  Skin:    General: Skin is warm and dry.  Neurological:     Mental Status: He is alert.     ED Results / Procedures / Treatments   Labs (all labs ordered are listed, but only abnormal results are displayed) Labs  Reviewed  CBG MONITORING, ED - Abnormal; Notable for the following components:      Result Value   Glucose-Capillary 49 (*)    All other components within normal limits  CBG MONITORING, ED - Abnormal; Notable for the following components:   Glucose-Capillary 51 (*)    All other components within normal limits  CBG MONITORING, ED - Abnormal; Notable for the following components:   Glucose-Capillary 104 (*)     All other components within normal limits  LIPASE, BLOOD  COMPREHENSIVE METABOLIC PANEL  URINALYSIS, ROUTINE W REFLEX MICROSCOPIC  CBC WITH DIFFERENTIAL/PLATELET  AMMONIA  CBG MONITORING, ED    EKG EKG Interpretation  Date/Time:  Sunday December 31 2019 18:00:12 EDT Ventricular Rate:  84 PR Interval:    QRS Duration: 101 QT Interval:  381 QTC Calculation: 451 R Axis:   25 Text Interpretation: Sinus rhythm no acute ST/T changes similar to December 13 2019 Confirmed by Sherwood Gambler [phone]) on 12/31/2019 6:21:48 PM   Radiology CT ABDOMEN PELVIS WO CONTRAST  Result Date: 12/31/2019 CLINICAL DATA:  Abdominal distension, abdominal pain for 2 weeks. EXAM: CT ABDOMEN AND PELVIS WITHOUT CONTRAST TECHNIQUE: Multidetector CT imaging of the abdomen and pelvis was performed following the standard protocol without IV contrast. COMPARISON:  January 10, 2016 FINDINGS: Lower chest: Moderate LEFT and small RIGHT pleural effusion with basilar airspace disease also greatest on the LEFT. Central venous access device terminates at the caval to atrial junction incompletely imaged. No pericardial fluid. Hepatobiliary: Heterogeneity of the RIGHT hepatic lobe showing linear and branching appearance grossly similar to the study of January 10, 2016. Frankly cirrhotic liver. Cholelithiasis. Gallbladder is collapsed. There is generalized stranding throughout the abdomen nonspecific to the gallbladder. Focal low attenuation in the RIGHT hemi liver on image 15 of series 2 2.5 x 3.4 cm. Pancreas: Pancreas is normal without ductal dilation, gross inflammation or contour change from previous imaging study. Spleen: Spleen remains enlarged. Signs of portosystemic collaterals in the upper abdomen. This includes splenorenal shunting. Adrenals/Urinary Tract: Adrenal glands grossly normal though LEFT adrenal is partially obscured by retroperitoneal venous collaterals. No hydronephrosis. No perinephric stranding. Urinary bladder  collapsed with Foley catheter in situ. No nephrolithiasis. No ureteral calculi. Stomach/Bowel: Stool throughout the colon. Generalized edema in the abdominal mesenteries. Small volume ascites mainly in the LEFT hemiabdomen. No perienteric or pericolonic stranding. Signs of colonic diverticulosis, mild. Vascular/Lymphatic: Calcified atheromatous plaque in the abdominal aorta. No aneurysmal dilation. No adenopathy in the retroperitoneum. No adenopathy in the pelvis. 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 Reproductive: Prostate grossly normal by CT. Other: Body wall edema and skin thickening over a lower abdominal pannus. Skin thickening was present previously. Interval abdominal wall reconstruction and repair of umbilical hernia since the prior study. Generalized body wall edema. Musculoskeletal: No acute musculoskeletal process. Spinal degenerative changes. IMPRESSION: 1. Moderate LEFT and small RIGHT pleural effusion with basilar airspace disease also greatest on the LEFT. Airspace disease likely volume loss associated with effusion. 2. Calcification of the periphery of the SMV and portal venous system can be seen in the setting of chronic thrombosis. CT or multiphase MRI may be helpful as clinically warranted. 3. Cirrhotic liver with signs of portal hypertension. Generalized edema could be also related to liver disease. Focal area of low attenuation in the RIGHT hemi liver may represent confluent fibrosis the given severity of cirrhotic changes however, would suggest follow-up with multiphase CT or MRI as  dictated by the patient's renal function. 4. Small volume ascites mainly in the LEFT hemiabdomen. The location of the ascites raising the question of some loculation. No peripheral rind is suggested on noncontrast imaging. Correlate with any clinical symptoms of or history of SBP. 5. Cholelithiasis with collapsed  gallbladder and with generalized stranding in the abdomen in the setting of portal hypertension. 6. Interval abdominal wall reconstruction and repair of umbilical hernia since the prior study. 7. Body wall edema and skin thickening over a lower abdominal pannus. Skin thickening was present previously. Correlate with any clinical evidence of cellulitis. 8. Aortic atherosclerosis. These results were called by telephone at the time of interpretation on 12/31/2019 at 7:44 pm to provider Taichi Repka , who verbally acknowledged these results. Aortic Atherosclerosis (ICD10-I70.0). Electronically Signed   By: Zetta Bills M.D.   On: 12/31/2019 19:38    Procedures Procedures (including critical care time)  Medications Ordered in ED Medications  fentaNYL (SUBLIMAZE) injection 50 mcg (50 mcg Intravenous Given 12/31/19 1815)  ondansetron (ZOFRAN) injection 4 mg (4 mg Intravenous Given 12/31/19 1819)  dextrose 50 % solution 50 mL (50 mLs Intravenous Given 12/31/19 1827)    ED Course  I have reviewed the triage vital signs and the nursing notes.  Pertinent labs & imaging results that were available during my care of the patient were reviewed by me and considered in my medical decision making (see chart for details).    MDM Rules/Calculators/A&P                          8:00 PM Patient's initial CBG was 49, he was given juice and crackers for snack, repeated 30 minutes later, had only improved to 51.  He was given an amp of D50 per IV.  Will recheck cbg in 1 hour.    Other labs currently pending. Pt will probably require admission given complexity of case and CT imaging.  Pt discussed with Dr. Regenia Skeeter who assumes care.       Final Clinical Impression(s) / ED Diagnoses Final diagnoses:  None    Rx / DC Orders ED Discharge Orders    None       Landis Martins 12/31/19 Monte Fantasia, MD 01/02/20 1002

## 2019-12-31 NOTE — ED Triage Notes (Addendum)
Patient complains of RLQ abdominal pain for the past 2 wks. Denies fevers and N/V/D. Patient has a chronic foley cath.

## 2019-12-31 NOTE — ED Notes (Signed)
D50 at bedside.

## 2019-12-31 NOTE — ED Notes (Signed)
Pt is talking randomly but does answer and responds appropriately at times. Glucose 51 after juice and snack.  Jeffrey [name] in room. One Amp of D50 given.

## 2020-01-01 ENCOUNTER — Inpatient Hospital Stay (HOSPITAL_COMMUNITY): Payer: Medicare Other

## 2020-01-01 DIAGNOSIS — K7031 Alcoholic cirrhosis of liver with ascites: Secondary | ICD-10-CM

## 2020-01-01 DIAGNOSIS — K7682 Hepatic encephalopathy: Secondary | ICD-10-CM | POA: Diagnosis present

## 2020-01-01 DIAGNOSIS — K7200 Acute and subacute hepatic failure without coma: Secondary | ICD-10-CM

## 2020-01-01 DIAGNOSIS — K7290 Hepatic failure, unspecified without coma: Secondary | ICD-10-CM | POA: Diagnosis present

## 2020-01-01 DIAGNOSIS — G9341 Metabolic encephalopathy: Secondary | ICD-10-CM | POA: Diagnosis present

## 2020-01-01 DIAGNOSIS — I350 Nonrheumatic aortic (valve) stenosis: Secondary | ICD-10-CM

## 2020-01-01 LAB — ECHOCARDIOGRAM COMPLETE
AR max vel: 1.69 cm2
AV Area VTI: 1.72 cm2
AV Area mean vel: 1.63 cm2
AV Mean grad: 9.3 mmHg
AV Peak grad: 16.8 mmHg
Ao pk vel: 2.05 m/s
Area-P 1/2: 3.56 cm2
Height: 67 in
S' Lateral: 2.6 cm
Weight: 3816.6 oz

## 2020-01-01 LAB — URINALYSIS, ROUTINE W REFLEX MICROSCOPIC
Bilirubin Urine: NEGATIVE
Glucose, UA: NEGATIVE mg/dL
Hgb urine dipstick: NEGATIVE
Ketones, ur: NEGATIVE mg/dL
Nitrite: NEGATIVE
Protein, ur: NEGATIVE mg/dL
Specific Gravity, Urine: 1.005 (ref 1.005–1.030)
WBC, UA: >50 WBC/hpf — ABNORMAL HIGH (ref 0–5)
pH: 5 (ref 5.0–8.0)

## 2020-01-01 LAB — BODY FLUID CELL COUNT WITH DIFFERENTIAL
Eos, Fluid: 2 %
Eos, Fluid: 1 %
Lymphs, Fluid: 50 %
Lymphs, Fluid: 73 %
Monocyte-Macrophage-Serous Fluid: 40 % — ABNORMAL LOW (ref 50–90)
Monocyte-Macrophage-Serous Fluid: 19 % — ABNORMAL LOW (ref 50–90)
Neutrophil Count, Fluid: 8 % (ref 0–25)
Neutrophil Count, Fluid: 7 % (ref 0–25)
Total Nucleated Cell Count, Fluid: 298 cu mm (ref 0–1000)
Total Nucleated Cell Count, Fluid: 171 cu mm (ref 0–1000)

## 2020-01-01 LAB — CBG MONITORING, ED
Glucose-Capillary: 28 mg/dL — CL (ref 70–99)
Glucose-Capillary: 98 mg/dL (ref 70–99)
Glucose-Capillary: 84 mg/dL (ref 70–99)
Glucose-Capillary: 84 mg/dL (ref 70–99)
Glucose-Capillary: 65 mg/dL — ABNORMAL LOW (ref 70–99)
Glucose-Capillary: 61 mg/dL — ABNORMAL LOW (ref 70–99)
Glucose-Capillary: 106 mg/dL — ABNORMAL HIGH (ref 70–99)
Glucose-Capillary: 115 mg/dL — ABNORMAL HIGH (ref 70–99)
Glucose-Capillary: 102 mg/dL — ABNORMAL HIGH (ref 70–99)

## 2020-01-01 LAB — COMPREHENSIVE METABOLIC PANEL
ALT: 24 U/L (ref 0–44)
AST: 26 U/L (ref 15–41)
Albumin: 3 g/dL — ABNORMAL LOW (ref 3.5–5.0)
Alkaline Phosphatase: 163 U/L — ABNORMAL HIGH (ref 38–126)
Anion gap: 11 (ref 5–15)
BUN: 46 mg/dL — ABNORMAL HIGH (ref 8–23)
CO2: 22 mmol/L (ref 22–32)
Calcium: 8.6 mg/dL — ABNORMAL LOW (ref 8.9–10.3)
Chloride: 93 mmol/L — ABNORMAL LOW (ref 98–111)
Creatinine, Ser: 4.81 mg/dL — ABNORMAL HIGH (ref 0.61–1.24)
GFR calc Af Amer: 13 mL/min — ABNORMAL LOW (ref >60)
GFR calc non Af Amer: 11 mL/min — ABNORMAL LOW (ref >60)
Glucose, Bld: 81 mg/dL (ref 70–99)
Potassium: 5.7 mmol/L — ABNORMAL HIGH (ref 3.5–5.1)
Sodium: 126 mmol/L — ABNORMAL LOW (ref 135–145)
Total Bilirubin: 0.8 mg/dL (ref 0.3–1.2)
Total Protein: 6.9 g/dL (ref 6.5–8.1)

## 2020-01-01 LAB — GRAM STAIN: Gram Stain: NONE SEEN

## 2020-01-01 LAB — GLUCOSE, PLEURAL OR PERITONEAL FLUID: Glucose, Fluid: 95 mg/dL

## 2020-01-01 LAB — CBC
HCT: 27.2 % — ABNORMAL LOW (ref 39.0–52.0)
Hemoglobin: 8.3 g/dL — ABNORMAL LOW (ref 13.0–17.0)
MCH: 29.3 pg (ref 26.0–34.0)
MCHC: 30.5 g/dL (ref 30.0–36.0)
MCV: 96.1 fL (ref 80.0–100.0)
Platelets: 121 10*3/uL — ABNORMAL LOW (ref 150–400)
RBC: 2.83 MIL/uL — ABNORMAL LOW (ref 4.22–5.81)
RDW: 16.8 % — ABNORMAL HIGH (ref 11.5–15.5)
WBC: 4.1 10*3/uL (ref 4.0–10.5)
nRBC: 0 % (ref 0.0–0.2)

## 2020-01-01 LAB — SARS CORONAVIRUS 2 BY RT PCR (HOSPITAL ORDER, PERFORMED IN ~~LOC~~ HOSPITAL LAB): SARS Coronavirus 2: NEGATIVE

## 2020-01-01 LAB — GLUCOSE, CAPILLARY
Glucose-Capillary: 34 mg/dL — CL (ref 70–99)
Glucose-Capillary: 96 mg/dL (ref 70–99)
Glucose-Capillary: 78 mg/dL (ref 70–99)
Glucose-Capillary: 79 mg/dL (ref 70–99)

## 2020-01-01 LAB — MAGNESIUM: Magnesium: 1.8 mg/dL (ref 1.7–2.4)

## 2020-01-01 LAB — PROTIME-INR
INR: 1.2 (ref 0.8–1.2)
Prothrombin Time: 14.6 seconds (ref 11.4–15.2)

## 2020-01-01 LAB — PROTEIN, PLEURAL OR PERITONEAL FLUID: Total protein, fluid: 2.5 g/dL

## 2020-01-01 LAB — APTT: aPTT: 43 seconds — ABNORMAL HIGH (ref 24–36)

## 2020-01-01 LAB — AMMONIA: Ammonia: 66 umol/L — ABNORMAL HIGH (ref 9–35)

## 2020-01-01 LAB — PHOSPHORUS: Phosphorus: 7.2 mg/dL — ABNORMAL HIGH (ref 2.5–4.6)

## 2020-01-01 LAB — LACTATE DEHYDROGENASE, PLEURAL OR PERITONEAL FLUID: LD, Fluid: 136 U/L — ABNORMAL HIGH (ref 3–23)

## 2020-01-01 MED ORDER — DEXTROSE 50 % IV SOLN
1.0000 | Freq: Once | INTRAVENOUS | Status: AC
  Administered 2020-01-01: 50 mL via INTRAVENOUS
  Filled 2020-01-01: qty 50

## 2020-01-01 MED ORDER — INSULIN ASPART 100 UNIT/ML ~~LOC~~ SOLN
0.0000 [IU] | SUBCUTANEOUS | Status: DC
  Administered 2020-01-02: 1 [IU] via SUBCUTANEOUS
  Administered 2020-01-03: 2 [IU] via SUBCUTANEOUS
  Administered 2020-01-04: 1 [IU] via SUBCUTANEOUS
  Administered 2020-01-04: 2 [IU] via SUBCUTANEOUS

## 2020-01-01 MED ORDER — FENTANYL CITRATE (PF) 100 MCG/2ML IJ SOLN
25.0000 ug | Freq: Once | INTRAMUSCULAR | Status: AC
  Administered 2020-01-01: 25 ug via INTRAVENOUS
  Filled 2020-01-01: qty 2

## 2020-01-01 MED ORDER — LACTULOSE 10 GM/15ML PO SOLN
20.0000 g | Freq: Three times a day (TID) | ORAL | Status: DC
  Administered 2020-01-01 – 2020-01-02 (×3): 20 g via ORAL
  Filled 2020-01-01 (×4): qty 30

## 2020-01-01 MED ORDER — PRAVASTATIN SODIUM 10 MG PO TABS
20.0000 mg | ORAL_TABLET | Freq: Every evening | ORAL | Status: DC
  Administered 2020-01-01 – 2020-01-05 (×5): 20 mg via ORAL
  Filled 2020-01-01 (×4): qty 2
  Filled 2020-01-01: qty 1
  Filled 2020-01-01: qty 2
  Filled 2020-01-01: qty 1

## 2020-01-01 MED ORDER — FUROSEMIDE 40 MG PO TABS: 80.0000 mg | ORAL_TABLET | Freq: Every morning | ORAL | Status: DC

## 2020-01-01 MED ORDER — VENLAFAXINE HCL 37.5 MG PO TABS
75.0000 mg | ORAL_TABLET | Freq: Three times a day (TID) | ORAL | Status: DC
  Administered 2020-01-01 – 2020-01-06 (×17): 75 mg via ORAL
  Filled 2020-01-01: qty 2
  Filled 2020-01-01: qty 1
  Filled 2020-01-01: qty 2
  Filled 2020-01-01: qty 1
  Filled 2020-01-01 (×2): qty 2
  Filled 2020-01-01: qty 1
  Filled 2020-01-01 (×2): qty 2
  Filled 2020-01-01 (×2): qty 1
  Filled 2020-01-01 (×4): qty 2
  Filled 2020-01-01 (×3): qty 1
  Filled 2020-01-01 (×7): qty 2

## 2020-01-01 MED ORDER — DEXTROSE 10 % IV SOLN: Freq: Once | INTRAVENOUS | Status: AC

## 2020-01-01 MED ORDER — POVIDONE-IODINE 10 % EX SOLN
CUTANEOUS | Status: AC
  Filled 2020-01-01: qty 15

## 2020-01-01 MED ORDER — METOPROLOL SUCCINATE ER 50 MG PO TB24
50.0000 mg | ORAL_TABLET | Freq: Two times a day (BID) | ORAL | Status: DC
  Administered 2020-01-01 – 2020-01-05 (×9): 50 mg via ORAL
  Filled 2020-01-01 (×11): qty 1

## 2020-01-01 MED ORDER — DEXTROSE 50 % IV SOLN
INTRAVENOUS | Status: AC
  Administered 2020-01-01: 50 mL via INTRAVENOUS
  Filled 2020-01-01: qty 50

## 2020-01-01 MED ORDER — LABETALOL HCL 5 MG/ML IV SOLN: 10.0000 mg | INTRAVENOUS | Status: DC | PRN

## 2020-01-01 MED ORDER — POLYSACCHARIDE IRON COMPLEX 150 MG PO CAPS
150.0000 mg | ORAL_CAPSULE | Freq: Every day | ORAL | Status: DC
  Administered 2020-01-01 – 2020-01-05 (×5): 150 mg via ORAL
  Filled 2020-01-01 (×7): qty 1

## 2020-01-01 MED ORDER — VITAMIN B-12 1000 MCG PO TABS
1000.0000 ug | ORAL_TABLET | Freq: Every day | ORAL | Status: DC
  Administered 2020-01-01 – 2020-01-06 (×6): 1000 ug via ORAL
  Filled 2020-01-01 (×6): qty 1

## 2020-01-01 MED ORDER — SPIRONOLACTONE 25 MG PO TABS
50.0000 mg | ORAL_TABLET | Freq: Every morning | ORAL | Status: DC
  Administered 2020-01-01 – 2020-01-06 (×6): 50 mg via ORAL
  Filled 2020-01-01 (×6): qty 2
  Filled 2020-01-01 (×3): qty 1

## 2020-01-01 MED ORDER — HYDROCODONE-ACETAMINOPHEN 5-325 MG PO TABS
1.0000 | ORAL_TABLET | Freq: Four times a day (QID) | ORAL | Status: DC | PRN
  Administered 2020-01-01 – 2020-01-06 (×9): 1 via ORAL
  Filled 2020-01-01 (×9): qty 1

## 2020-01-01 MED ORDER — FUROSEMIDE 40 MG PO TABS
40.0000 mg | ORAL_TABLET | Freq: Two times a day (BID) | ORAL | Status: DC
  Administered 2020-01-01 – 2020-01-06 (×11): 40 mg via ORAL
  Filled 2020-01-01 (×11): qty 1

## 2020-01-01 MED ORDER — THIAMINE HCL 100 MG PO TABS
100.0000 mg | ORAL_TABLET | Freq: Every morning | ORAL | Status: DC
  Administered 2020-01-01 – 2020-01-06 (×6): 100 mg via ORAL
  Filled 2020-01-01 (×6): qty 1

## 2020-01-01 MED ORDER — TAMSULOSIN HCL 0.4 MG PO CAPS
0.4000 mg | ORAL_CAPSULE | Freq: Every evening | ORAL | Status: DC
  Administered 2020-01-01 – 2020-01-05 (×5): 0.4 mg via ORAL
  Filled 2020-01-01 (×5): qty 1

## 2020-01-01 MED ORDER — DEXTROSE 5 % IV SOLN: Freq: Once | INTRAVENOUS | Status: AC

## 2020-01-01 MED ORDER — METOPROLOL SUCCINATE ER 50 MG PO TB24: 50.0000 mg | ORAL_TABLET | Freq: Two times a day (BID) | ORAL | Status: DC

## 2020-01-01 MED ORDER — RIFAXIMIN 200 MG PO TABS
200.0000 mg | ORAL_TABLET | Freq: Two times a day (BID) | ORAL | Status: DC
  Administered 2020-01-01: 200 mg via ORAL
  Filled 2020-01-01 (×6): qty 1

## 2020-01-01 MED ORDER — METOPROLOL SUCCINATE ER 25 MG PO TB24: 25.0000 mg | ORAL_TABLET | Freq: Every day | ORAL | Status: DC

## 2020-01-01 MED ORDER — SPIRONOLACTONE 50 MG PO TABS: 50.0000 mg | ORAL_TABLET | Freq: Every morning | ORAL | Status: DC

## 2020-01-01 MED ORDER — IOHEXOL 300 MG/ML  SOLN
100.0000 mL | Freq: Once | INTRAMUSCULAR | Status: AC | PRN
  Administered 2020-01-01: 100 mL via INTRAVENOUS

## 2020-01-01 MED ORDER — RIFAXIMIN 550 MG PO TABS
550.0000 mg | ORAL_TABLET | Freq: Two times a day (BID) | ORAL | Status: DC
  Administered 2020-01-01 – 2020-01-06 (×11): 550 mg via ORAL
  Filled 2020-01-01 (×11): qty 1

## 2020-01-01 NOTE — Consult Note (Signed)
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  Evaluation in the ER revealed moderate left and small right pleural effusion, features of cirrhosis and portal hypertension, and some ascites on the left side of the abdomen.  Ammonia level was elevated, given lactulose.  He had hypoglycemia and placed on a D5 drip.  Labs in the ED notable for sodium 123, K5.7, bicarbonate 18 with anion gap of 12.  BUN of 44.  Hemoglobin 8.3 with platelet count of 123.  Albumin level of 3.1.  Ammonia level of 86.  Patient has a large ecchymosis on the left upper arm, I suspect related to attempted cannulation of his fistula, he had a second stage DVT on 4/29 with VVS.  Bruit and thrill are present.  He still has a tunneled HD catheter on the right side.  He is a poor historian   ORDERS  FROM PREV ADMISSION: Dialysis Orders: Center: [facility]  on MWF . EDW 109 kg HD Bath 2K/2.5Ca  Time 4 hours Heparin none. Access LUE AVF (new using 17 gauge needles) BFR 350 DFR 600    Epogen 10,000   Units IV/HD  Venofer  100 mg IV q tx for 10 doses   ] I/Os: I/O last 3 completed shifts: In: 450 [I.V.:450] Out: 50 [Urine:1300]   ROS Balance of 12 systems is negative w/ exceptions as above  PMH  Past Medical History:  Diagnosis Date  . A-fib [facility])    when initially stating dialysis january 2021 at [facility] per spouse; never heard anything else about it  . Alcoholic cirrhosis ([place])    patient reports completing hep a and b vaccines in 2006  . Anemia    has had 3 units of prbcs 2013  . Anxiety   . Arthritis   . Asymptomatic gallstones    Ultrasound in 2006  . C. difficile colitis   .  Cholelithiasis   . Chronic kidney disease    Dialysis M/W/F/Sa  . Depression   . Diabetes ([place])   . Duodenal ulcer with hemorrhage    per patient in 2006 or 2007, records have been requested  . Dyspnea    low oxygen level -  has O2 2 L all day  . 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 GERD (gastroesophageal reflux disease)   . Heart burn   . 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 HOH (hard of hearing)   . Hypertension   . Liver cirrhosis ([place])   . Splenomegaly    Ultrasound in 2006   [place]  Past Surgical History:  Procedure Laterality Date  . AGILE CAPSULE N/A 09/20/2019   Procedure: AGILE CAPSULE;  Surgeon: Daneil Dolin, MD;  [facility];  Service: Endoscopy;  Laterality: N/A;  . APPENDECTOMY    . AV FISTULA PLACEMENT Left 08/17/2019   Procedure: ARTERIOVENOUS (AV) FISTULA CREATION VERSES ARTERIOVENOUS GRAFT;  Surgeon: [name], MD;  Location: [place];  Service: Vascular;  Laterality: Left;  . BASCILIC VEIN TRANSPOSITION Left 09/28/2019   Procedure: LEFT SECOND STAGE BASCILIC VEIN TRANSPOSITION;  Surgeon: [name], MD;  Location: [place];  Service: Vascular;  Laterality: Left;  . BIOPSY  02/03/2018   Procedure: BIOPSY;  Surgeon: Daneil Dolin, MD;  [facility];  Service: Endoscopy;;  bx of gastric polyps  . BIOPSY  09/19/2019   Procedure: BIOPSY;  Surgeon: Danie Binder, MD;  [facility];  Service: Endoscopy;;  . CATARACT EXTRACTION Right   . COLONOSCOPY  03/10/2005   Rectal polyp as described above, removed with snare. Left sided  diverticula. The remainder of the colonic mucosa appeared normal. Inflammed polyp on path.  . COLONOSCOPY  04/1999   Dr. Thea Silversmith polyps removed,  Path showed hyperplastic  . COLONOSCOPY  10/2015   Dr. Britta Mccreedy: diverticulosis, single sessile tubular adenoma 3-61mm in size removed from descending colon.   . COLONOSCOPY WITH ESOPHAGOGASTRODUODENOSCOPY (EGD)  03/31/2012   VXB:LTJQZES AVMs. Colonic  diverticulosis. tubular adenoma colon  . COLONOSCOPY WITH ESOPHAGOGASTRODUODENOSCOPY (EGD)  06/2019   FORSYTH: small esophageal varices without high risk stigmata, single large AVM on lesser curvature in gastric body s/p APC ablation, gastric antral and duodenal bulb polyposis. No significant source to explain transfusion dependent anemia. Colonoscopy with portal colopathy and diffuse edema, changes of Cdiff colitis on colonoscopy  . ESOPHAGEAL BANDING N/A 02/03/2018   Procedure: ESOPHAGEAL BANDING;  Surgeon: Daneil Dolin, MD;  [facility];  Service: Endoscopy;  Laterality: N/A;  . ESOPHAGOGASTRODUODENOSCOPY  01/15/2005   Three columns grade 1 to 2 esophageal varices, otherwise normal esophageal mucosa.  Esophagus was not manipulated otherwise./Nodularity of the antrum with overlying erosions, nonspecific finding. Path showed rare H.pylori  . ESOPHAGOGASTRODUODENOSCOPY  09/2008   Dr. Gaylene Brooks columns of grade 2-3 esoph varices, only one column was prominent. Portal gastropathy, multiple gastrc polyps at antrum, two were 2cm with black eschar, bulbar polyps, bulbar erosions  . ESOPHAGOGASTRODUODENOSCOPY  11/2004   Dr. Brantley Stage 3 esoph varices  . ESOPHAGOGASTRODUODENOSCOPY  03/31/2012   RMR: 4 columns(3-GR 2, 1-GR1) non-bleeding esophageal varices, portal gastropathy, small HH, early GAVE, multiple gastric polyps   . ESOPHAGOGASTRODUODENOSCOPY (EGD) WITH PROPOFOL N/A 02/03/2018   Dr. Gala Romney: Esophageal varices, 3 columns grade 1-2.  Portal hypertensive gastropathy.  Multiple gastric polyps, biopsy consistent with hyperplastic.  [name] ESOPHAGOGASTRODUODENOSCOPY (EGD) WITH PROPOFOL N/A 09/19/2019   Fields: grade I and II esophageal varcies, mild portal hypertensive gastropathy, moderate gastritis but no H. pylori, single hyperplastic gastric polyp removed, obvious source for melena/transfusion dependent anemia not identified, may be due to friable gastric and duodenal mucosa in the  setting of portal hypertension  . GASTRIC VARICES BANDING  03/31/2012   Procedure: GASTRIC VARICES BANDING;  Surgeon: Daneil Dolin, MD;  [facility];  Service: Endoscopy;  Laterality: N/A;  . GIVENS CAPSULE STUDY N/A 09/21/2019   Poor study with a lot of debris obstructing much of the view of the first approximate 3 hours out of 6 hours.  No obvious source of bleeding identified.  Sequela of bleeding and old blood seen in the form of"whisps" of blood, blood flecks mostly toward the end of the study.  [name] POLYPECTOMY  09/19/2019   Procedure: POLYPECTOMY;  Surgeon: Danie Binder, MD;  [facility];  Service: Endoscopy;;  . UMBILICAL HERNIA REPAIR  2017   exploratory laparotomy, abdominal washout   FH  Family History  Problem Relation Age of Onset  . Colon cancer Father 78       deceased age 75  . Breast cancer Sister        deceased  . Diabetes Sister   .  Stroke Mother   . Healthy Son   . Healthy Daughter   . Lung cancer Neg Hx   . Ovarian cancer Neg Hx    SH  reports that he quit smoking about 33 years ago. His smoking use included cigarettes. He has a 50.00 pack-year smoking history. He has never used smokeless tobacco. He reports that he does not drink alcohol and does not use drugs. Allergies  Allergies  Allergen Reactions  . Chlorhexidine   . Tape Itching and Rash   Home medications Prior to Admission medications   Medication Sig Start Date End Date Taking? Authorizing Provider  Ascorbic Acid (VITAMIN C) 1000 MG tablet Take 1,000 mg by mouth 2 (two) times daily.    [provider]  furosemide (LASIX) 80 MG tablet Take 80 mg by mouth in the morning.  08/07/19   [provider]  gabapentin (NEURONTIN) 100 MG capsule TAKE 1 CAPSULE BY MOUTH AT BEDTIME 10/05/19   Waynetta Sandy, MD  HUMALOG KWIKPEN 100 UNIT/ML KwikPen as needed. Sliding scale 11/05/19   [provider]  HYDROcodone-acetaminophen (NORCO) 10-325 MG tablet Take  1 tablet by mouth every 6 (six) hours as needed for moderate pain. Patient taking differently: Take 1 tablet by mouth as needed for moderate pain.  09/28/19   Rhyne, Hulen Shouts, PA-C  insulin degludec (TRESIBA FLEXTOUCH) 200 UNIT/ML FlexTouch Pen Inject 120 Units into the skin at bedtime. When blood sugars are up    [provider]  iron polysaccharides (NIFEREX) 150 MG capsule Take 150 mg by mouth at bedtime.     [provider]  lactulose (CHRONULAC) 10 GM/15ML solution Take 45 mLs (30 g total) by mouth 2 (two) times daily. 12/17/19   Rai, Vernelle Emerald, MD  metoprolol succinate (TOPROL-XL) 25 MG 24 hr tablet Take 25 mg by mouth daily. Only on Monday ,Wednesday and Friday 11/08/19   [provider]  milk thistle 175 MG tablet Take 175 mg by mouth 3 (three) times daily.     [provider]  Multiple Vitamin (MULTIVITAMIN WITH MINERALS) TABS tablet Take 1 tablet by mouth in the morning.     [provider]  omega-3 acid ethyl esters (LOVAZA) 1 g capsule Take 2 g by mouth 2 (two) times daily.  04/07/19   [provider]  omeprazole (PRILOSEC) 40 MG capsule Take 40 mg by mouth in the morning.     [provider]  pravastatin (PRAVACHOL) 20 MG tablet Take 20 mg by mouth in the morning.     [provider]  Probiotic Product (PROBIOTIC DAILY PO) Take 1 tablet by mouth in the morning.     [provider]  rifaximin (XIFAXAN) 550 MG TABS tablet Take 1 tablet (550 mg total) by mouth 2 (two) times daily. 01/13/19   Mahala Menghini, PA-C  spironolactone (ALDACTONE) 50 MG tablet Take 50 mg by mouth in the morning.     [provider]  tamsulosin (FLOMAX) 0.4 MG CAPS capsule Take 0.4 mg by mouth every evening.    [provider]  thiamine 100 MG tablet Take 100 mg by mouth in the morning. Vitamin B-1    [provider]  tiZANidine (ZANAFLEX) 2 MG tablet Take 2 mg by mouth as needed for muscle spasms.     [provider]  triamcinolone cream (KENALOG) 0.1 % Apply 1 application topically 2 (two) times daily as needed (dry skin Itching).  12/30/17   [provider]  venlafaxine (EFFEXOR) 75 MG tablet Take 75 mg by mouth 3 (three) times daily with meals.     [provider]  vitamin B-12 (CYANOCOBALAMIN) 1000 MCG tablet Take 1,000 mcg by mouth daily.    [provider]    Current Medications Scheduled Meds: . dextrose  1 ampule Intravenous Once  . furosemide  80 mg Oral q AM  . insulin aspart  0-9 Units Subcutaneous Q4H  . iron polysaccharides  150 mg Oral QHS  . lactulose  20 g Oral TID  . metoprolol succinate  25 mg Oral Daily  . pravastatin  20 mg Oral QPM  . rifaximin  200 mg Oral BID  . spironolactone  50 mg Oral q AM  . tamsulosin  0.4 mg Oral QPM  . venlafaxine  75 mg Oral TID WC   Continuous Infusions: PRN Meds:.  CBC Recent Labs  Lab 12/31/19 1909 01/01/20 0424  WBC 4.0 4.1  NEUTROABS 3.3  --   HGB 8.3* 8.3*  HCT 27.7* 27.2*  MCV 96.5 96.1  PLT 123* 419*   Basic Metabolic Panel Recent Labs  Lab 12/31/19 1909 01/01/20 0424  NA 123* 126*  K 5.7* 5.7*  CL 93* 93*  CO2 18* 22  GLUCOSE 102* 81  BUN 44* 46*  CREATININE 4.73* 4.81*  CALCIUM 8.5* 8.6*  PHOS  --  7.2*    Physical Exam  Blood pressure (!) 143/74, pulse 90, temperature 98.3 F (36.8 C), temperature source Oral, resp. rate 20, height 5\' 7"  (1.702 m), weight (!) 108.2 kg, SpO2 98 %. GEN: Obese, chronically ill appearing, lying in bed ENT: NCAT EYES: EOMI CV: Regular, no rub PULM: Diminished in the bases, normal work of breathing ABD: Obese, nontender SKIN: Large resolving ecchymosis across much of the left arm EXT: 1-2+ lower extremity edema Right IJ tunneled HD catheter present LUE AV fistula with bruit and thrill  Assessment 74M ESRD on MWF HD [facility], ESLD with portal HTN, admit with abdominal pain found to have bilateral right greater than left pleural  effusions, some loculated ascites, and encephalopathy likely from hepatic origin.  Overall he appears to failure to thrive.  1. ESRD, for a dialysis today, will use TDC as arm appears to be healing: 3.5h, 2K, no heparin, 3L UF SBP > 95, 300/500, Na 134 2. ESLD alcoholic: ascites, PHG, EV, HE 3. Bilateral pleural effusions, for thoracentesis with primary today 4. Anemia, appears stable, hemoglobin in the eights 5. Hyponatremia, mild, use low sodium bath; its presence is very ominous 6. FTT 7. Significant infiltration of left upper arm AV fistula, resolving, fistula is patent  Plan 1. HD today as above 2. Recommend palliative consult 3. Daily weights, Daily Renal Panel, Strict I/Os   Rexene Agent  01/01/2020, 9:27 AM

## 2020-01-01 NOTE — Procedures (Signed)
     HEMODIALYSIS TREATMENT NOTE:  Uneventful 3.5 hour heparin-free dialysis completed via RIJ TDC.  Goal met: 3L removed without interruption in UF.  All blood was returned.  Rockwell Alexandria, RN

## 2020-01-01 NOTE — ED Notes (Signed)
Dr Tobe Sos made aware of pt CBG and aware that amp of D50 was given as emergency override.

## 2020-01-01 NOTE — Procedures (Signed)
PreOperative Dx: Alcoholic cirrhosis, ascites Postoperative Dx: Alcoholic cirrhosis, ascites Procedure:   US guided diagnostic paracentesis Radiologist:  Thornton Papas Anesthesia:  10 ml of1% lidocaine Specimen:  240 mL of yellow ascitic fluid EBL:   < 1 ml Complications: None

## 2020-01-01 NOTE — Progress Notes (Signed)
*  PRELIMINARY RESULTS* Echocardiogram 2D Echocardiogram has been performed.  Leavy Cella 01/01/2020, 4:13 PM

## 2020-01-01 NOTE — Procedures (Signed)
PreOperative Dx: LT pleural effusion Postoperative Dx: LT pleural effusion Procedure:   US guided LT thoracentesis Radiologist:  Thornton Papas Anesthesia:  10 ml of 1% lidocaine Specimen:  240 mL of dark yellow colored fluid EBL:   < 1 ml Complications: None

## 2020-01-01 NOTE — ED Notes (Signed)
Pt continues to be doing scans.

## 2020-01-01 NOTE — Progress Notes (Signed)
PROGRESS NOTE    Jeffrey [name]  RXV:[id number] DOB: 03-16-1946 DOA: 12/31/2019 PCP: Monico Blitz, MD   Brief Narrative:  Per HPI: Jeffrey [name] is a 74 y.o. male with medical history significant for alcoholic liver cirrhosis, ESRD(MWF), hypertension, diabetes mellitus type 2, hyperlipidemia, transfusion dependent anemia, portal hypertensive gastropathy, esophageal varices who presents to the ED due to 2 week onset of intermittent right upper and lower abdominal pain which became worse in the past 4 days. Patient was unable to provide history possibly due to being lethargic (though easily arousable). He only kept repeating that the pain was 7/10 for all questions related to the abdominal pain. History was obtained from the ED physician and ED medical  Record. Per report, Pain was sharp in nature and he denied increased abdominal swelling beyond his baseline. He was seen by his PCP (Dr Manuella Ghazi) 2 days ago who asked him to go to the ED for further evaluation but he was hoping that the pain will subside and today, pain worsened and wife noted that patient was more confused today, so he presented to the ED. There was no report of fever, chills, chest pain, SOB, N/V/D.  Of note, patient was reported to have missed his dialysis on Friday, last dialysis was on Wednesday (12/27/2019).  8/2: Patient was admitted with acute hepatic encephalopathy in the setting of hyperammonemia.  He is noted to have missed hemodialysis sessions and is not compliant with his home medications.  He has undergone thoracentesis of left pleural effusion today as well as paracentesis.  Plans for hemodialysis today as well.  Palliative consultation ordered.  Assessment & Plan:   Principal Problem:   Acute hepatic encephalopathy Active Problems:   Alcoholic cirrhosis of liver with ascites (HCC)   Type 2 diabetes mellitus (HCC)   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HCC)   Hyperkalemia   Hyponatremia   Pleural effusion   Hypoglycemia    Hyperammonemia (HCC)   Obesity (BMI 30-39.9)   Hypoalbuminemia   Hepatic encephalopathy (HCC)   Acute hepatic encephalopathy in the setting of hyperammonemia Ammonia level 86>66, lactulose was given in the ED, we shall continue with this and titrate to 2-3 loose bowel movements daily -not compliant with home lactulose Continue rifaximin Continue to monitor ammonia with morning labs -Appreciate further GI recommendations -Underwent paracentesis today with labs pending, no sign of SBP -Plan to remove port by end of hospitalization  Moderate left and right pleural effusion CT abdomen pelvis without contrast showed moderate left and small right pleural effusion with basilar airspace disease greatest on the left.  -Underwent left-sided thoracentesis on 8/2 -2D echocardiogram ordered 8/2 for further evaluation of possible CHF  ESRD on HD (MWF) Patient missed dialysis on Friday Last dialysis was on Wednesday (12/27/2019). Nephrology for further dialysis today  Hypoglycemia Patient presents with blood glucose in the high 20s, and IV D50 x1 was given Patient was started on D5 W at 75 with blood glucose stable ranging in the 60s, this was changed to D10W Continue Accu-Cheks and hypoglycemic protocol Continue to monitor blood glucose level and treat accordingly -Renal diet initiated  Type 2 Diabetes Mellitus with hypoglycemia Repeated doses of D50 and some infusion given Plan to start renal diet, monitor  Hyponatremia Na 123, possibly due to poor solute intake and missing hemodialysis Per nephrology with hemodialysis  Hyperkalemia K+ 5.7, no EKG changes This will be corrected with hemodialysis  History of alcoholic cirrhosis of liver with ascites CT abdomen pelvis without contrast  showed cirrhotic liver with signs of portal hypertension -No further alcohol use, confirmed with wife -Underwent paracentesis 8/2 with 259mL; fluid analysis pending  Hypoalbuminemia  Alb 3.1,  possibly related to patient's history of cirrhosis team Consider protein supplement when patient is able to tolerate p.o. diet discussed  Class II obesity (BMI 31.36) Patient will be counseled on diet and exercise when more stable   DVT prophylaxis: SCDs Code Status: Full code Family Communication: Discussed with wife on phone 8/2 Disposition Plan:   Status is: Inpatient  Remains inpatient appropriate because:Ongoing diagnostic testing needed not appropriate for outpatient work up, IV treatments appropriate due to intensity of illness or inability to take PO and Inpatient level of care appropriate due to severity of illness   Dispo: The patient is from: Home              Anticipated d/c is to: Home              Anticipated d/c date is: 3 days              Patient currently is not medically stable to d/c.  Consultants:   Nephrology  GI  Palliative care  Procedures:   Left-sided thoracentesis as well as paracentesis performed 8/2  Antimicrobials:  Anti-infectives (From admission, onward)   Start     Dose/Rate Route Frequency Ordered Stop   01/01/20 1100  rifaximin (XIFAXAN) tablet 550 mg     Discontinue     550 mg Oral 2 times daily 01/01/20 1038     01/01/20 0115  rifaximin (XIFAXAN) tablet 200 mg  Status:  Discontinued        200 mg Oral 2 times daily 01/01/20 0105 01/01/20 1038       Subjective: Patient seen and evaluated today with complaints of ongoing bowel movements.  He denies abdominal pain or shortness of breath at this time.  Objective: Vitals:   01/01/20 0600 01/01/20 0830 01/01/20 1300 01/01/20 1301  BP: (!) 168/67 (!) 143/74  (!) 163/70  Pulse: 83 90 87   Resp: 20     Temp:      TempSrc:      SpO2: 99% 98% 97%   Weight:      Height:        Intake/Output Summary (Last 24 hours) at 01/01/2020 1433 Last data filed at 01/01/2020 0513 Gross per 24 hour  Intake 450 ml  Output 1300 ml  Net -850 ml   Filed Weights   12/31/19 1556  Weight: (!)  108.2 kg    Examination:  General exam: Appears mildly agitated Respiratory system: Diminished bilaterally. Respiratory effort normal.  Currently on 3 L nasal cannula oxygen. Cardiovascular system: S1 & S2 heard, RRR. Gastrointestinal system: Abdomen is distended, nontender Central nervous system: Alert and awake Extremities: No edema Skin: No rashes, lesions or ulcers Psychiatry: Difficult to evaluate    Data Reviewed: I have personally reviewed following labs and imaging studies  CBC: Recent Labs  Lab 12/31/19 1909 01/01/20 0424  WBC 4.0 4.1  NEUTROABS 3.3  --   HGB 8.3* 8.3*  HCT 27.7* 27.2*  MCV 96.5 96.1  PLT 123* 295*   Basic Metabolic Panel: Recent Labs  Lab 12/31/19 1909 01/01/20 0424  NA 123* 126*  K 5.7* 5.7*  CL 93* 93*  CO2 18* 22  GLUCOSE 102* 81  BUN 44* 46*  CREATININE 4.73* 4.81*  CALCIUM 8.5* 8.6*  MG  --  1.8  PHOS  --  7.2*  GFR: Estimated Creatinine Clearance: 16.3 mL/min (A) (by C-G formula based on SCr of 4.81 mg/dL (H)). Liver Function Tests: Recent Labs  Lab 12/31/19 1909 01/01/20 0424  AST 29 26  ALT 25 24  ALKPHOS 174* 163*  BILITOT 0.7 0.8  PROT 7.1 6.9  ALBUMIN 3.1* 3.0*   Recent Labs  Lab 12/31/19 1909  LIPASE 23   Recent Labs  Lab 12/31/19 1909 01/01/20 0424  AMMONIA 86* 66*   Coagulation Profile: Recent Labs  Lab 01/01/20 0424  INR 1.2   Cardiac Enzymes: No results for input(s): CKTOTAL, CKMB, CKMBINDEX, TROPONINI in the last 168 hours. BNP (last 3 results) No results for input(s): PROBNP in the last 8760 hours. HbA1C: No results for input(s): HGBA1C in the last 72 hours. CBG: Recent Labs  Lab 01/01/20 0544 01/01/20 0632 01/01/20 0821 01/01/20 0959 01/01/20 1401  GLUCAP 61* 106* 115* 102* 34*   Lipid Profile: No results for input(s): CHOL, HDL, LDLCALC, TRIG, CHOLHDL, LDLDIRECT in the last 72 hours. Thyroid Function Tests: No results for input(s): TSH, T4TOTAL, FREET4, T3FREE, THYROIDAB in  the last 72 hours. Anemia Panel: No results for input(s): VITAMINB12, FOLATE, FERRITIN, TIBC, IRON, RETICCTPCT in the last 72 hours. Sepsis Labs: No results for input(s): PROCALCITON, LATICACIDVEN in the last 168 hours.  Recent Results (from the past 240 hour(s))  SARS Coronavirus 2 by RT PCR (hospital order, performed in [facility] hospital lab) Nasopharyngeal Nasopharyngeal Swab     Status: None   Collection Time: 12/31/19  8:59 PM   Specimen: Nasopharyngeal Swab  Result Value Ref Range Status   SARS Coronavirus 2 NEGATIVE NEGATIVE Final    Comment: (NOTE) SARS-CoV-2 target nucleic acids are NOT DETECTED.  The SARS-CoV-2 RNA is generally detectable in upper and lower respiratory specimens during the acute phase of infection. The lowest concentration of SARS-CoV-2 viral copies this assay can detect is 250 copies / mL. A negative result does not preclude SARS-CoV-2 infection and should not be used as the sole basis for treatment or other patient management decisions.  A negative result may occur with improper specimen collection / handling, submission of specimen other than nasopharyngeal swab, presence of viral mutation(s) within the areas targeted by this assay, and inadequate number of viral copies (<250 copies / mL). A negative result must be combined with clinical observations, patient history, and epidemiological information.  Fact Sheet for Patients:   StrictlyIdeas.no  Fact Sheet for Healthcare Providers: BankingDealers.co.za  This test is not yet approved or  cleared by the [place] FDA and has been authorized for detection and/or diagnosis of SARS-CoV-2 by FDA under an Emergency Use Authorization (EUA).  This EUA will remain in effect (meaning this test can be used) for the duration of the COVID-19 declaration under Section 564(b)(1) of the Act, 21 U.S.C. section 360bbb-3(b)(1), unless the authorization is terminated  or revoked sooner.  Performed at [facility], [address] [place], [place] [address]   Gram stain     Status: None   Collection Time: 01/01/20 11:40 AM   Specimen: Fluid  Result Value Ref Range Status   Specimen Description FLUID LEFT THORACENTESIS COLLECTED BY DOCTOR  Final   Special Requests NONE  Final   Gram Stain   Final    CYTOSPIN SMEAR WBC PRESENT, PREDOMINANTLY MONONUCLEAR NO ORGANISMS SEEN Performed at [facility] Performed at [facility], [address], [place], [place] [address]    Report Status 01/01/2020 FINAL  Final  Radiology Studies: CT ABDOMEN PELVIS W WO CONTRAST  Result Date: 01/01/2020 CLINICAL DATA:  Cirrhosis, abdominal pain,, 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 CONTRAST:  142mL OMNIPAQUE IOHEXOL 300 MG/ML  SOLN COMPARISON:  CT abdomen pelvis, 12/31/2019 FINDINGS: Lower chest: Unchanged moderate left, small right pleural effusions and associated atelectasis or consolidation. Coronary artery calcifications. Hepatobiliary: Cirrhotic morphology of the liver. There is unchanged, irregular subcapsular hypodensity of the anterior dome of the liver (series 12, image 119) without suspicious contrast enhancement or focal lesion, this appearance not changed compared to prior examinations dating back to 01/10/2016. Gallstones in the contracted gallbladder. No biliary ductal dilatation. Pancreas: Unremarkable. No pancreatic ductal dilatation or surrounding inflammatory changes. Spleen: Splenomegaly, maximum coronal span 17.0 cm. Adrenals/Urinary Tract: Adrenal glands are unremarkable. Kidneys are normal, without renal calculi, solid lesion, or hydronephrosis. Bladder is decompressed by Foley catheter.  Stomach/Bowel: Stomach is within normal limits. Appendix is not clearly visualized. No evidence of bowel wall thickening, distention, or inflammatory changes. Occasional sigmoid diverticula. Vascular/Lymphatic: Aortic atherosclerosis. There is some eccentric calcification of the central portions of the superior mesenteric vein near the confluence with the portal vein; there is no significant intraluminal thrombus (series 12, image 58). No enlarged abdominal or pelvic lymph nodes. Reproductive: No mass or other significant abnormality. Other: No abdominal wall hernia or abnormality. Small volume ascites in the abdomen and pelvis, unchanged compared to prior examination. Musculoskeletal: No acute or significant osseous findings. IMPRESSION: 1. Cirrhotic morphology of the liver. There is unchanged, irregular subcapsular hypodensity of the anterior dome of the liver without suspicious contrast enhancement or focal lesion, this appearance not changed compared to prior examinations dating back to 01/10/2016 and perhaps related to scarring or fibrosis. 2. There is some eccentric calcification of the central portions of the superior mesenteric vein near the confluence with the portal vein; there is no significant intraluminal thrombus. 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 3. Splenomegaly. 4. Cholelithiasis. 5. Small volume ascites in the abdomen and pelvis, unchanged compared to prior examination. 6. Unchanged moderate left, small right pleural effusions and associated atelectasis or consolidation. 7. Coronary artery disease.  Aortic Atherosclerosis (ICD10-I70.0). Electronically Signed   By: Eddie Candle M.D.   On: 01/01/2020 11:22   CT ABDOMEN PELVIS WO CONTRAST  Result Date: 12/31/2019 CLINICAL DATA:  Abdominal distension, abdominal pain for 2 weeks. EXAM: CT ABDOMEN AND PELVIS WITHOUT CONTRAST TECHNIQUE: Multidetector CT imaging of the abdomen and pelvis was performed  following the standard protocol without IV contrast. COMPARISON:  January 10, 2016 FINDINGS: Lower chest: Moderate LEFT and small RIGHT pleural effusion with basilar airspace disease also greatest on the LEFT. Central venous access device terminates at the caval to atrial junction incompletely imaged. No pericardial fluid. Hepatobiliary: Heterogeneity of the RIGHT hepatic lobe showing linear and branching appearance grossly similar to the study of January 10, 2016. Frankly cirrhotic liver. Cholelithiasis. Gallbladder is collapsed. There is generalized stranding throughout the abdomen nonspecific to the gallbladder. Focal low attenuation in the RIGHT hemi liver on image 15 of series 2 2.5 x 3.4 cm. Pancreas: Pancreas is normal without ductal dilation, gross inflammation or contour change from previous imaging study. Spleen: Spleen remains enlarged. Signs of portosystemic collaterals in the upper abdomen. This includes splenorenal shunting. Adrenals/Urinary Tract: Adrenal glands  grossly normal though LEFT adrenal is partially obscured by retroperitoneal venous collaterals. No hydronephrosis. No perinephric stranding. Urinary bladder collapsed with Foley catheter in situ. No nephrolithiasis. No ureteral calculi. Stomach/Bowel: Stool throughout the colon. Generalized edema in the abdominal mesenteries. Small volume ascites mainly in the LEFT hemiabdomen. No perienteric or pericolonic stranding. Signs of colonic diverticulosis, mild. Vascular/Lymphatic: Calcified atheromatous plaque in the abdominal aorta. No aneurysmal dilation. No adenopathy in the retroperitoneum. No adenopathy in the pelvis. 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 Reproductive: Prostate grossly normal by CT. Other: Body wall edema and skin thickening over a lower abdominal pannus. Skin thickening was present previously. Interval abdominal wall reconstruction and  repair of umbilical hernia since the prior study. Generalized body wall edema. Musculoskeletal: No acute musculoskeletal process. Spinal degenerative changes. IMPRESSION: 1. Moderate LEFT and small RIGHT pleural effusion with basilar airspace disease also greatest on the LEFT. Airspace disease likely volume loss associated with effusion. 2. Calcification of the periphery of the SMV and portal venous system can be seen in the setting of chronic thrombosis. CT or multiphase MRI may be helpful as clinically warranted. 3. Cirrhotic liver with signs of portal hypertension. Generalized edema could be also related to liver disease. Focal area of low attenuation in the RIGHT hemi liver may represent confluent fibrosis the given severity of cirrhotic changes however, would suggest follow-up with multiphase CT or MRI as dictated by the patient's renal function. 4. Small volume ascites mainly in the LEFT hemiabdomen. The location of the ascites raising the question of some loculation. No peripheral rind is suggested on noncontrast imaging. Correlate with any clinical symptoms of or history of SBP. 5. Cholelithiasis with collapsed gallbladder and with generalized stranding in the abdomen in the setting of portal hypertension. 6. Interval abdominal wall reconstruction and repair of umbilical hernia since the prior study. 7. Body wall edema and skin thickening over a lower abdominal pannus. Skin thickening was present previously. Correlate with any clinical evidence of cellulitis. 8. Aortic atherosclerosis. These results were called by telephone at the time of interpretation on 12/31/2019 at 7:44 pm to provider JULIE IDOL , who verbally acknowledged these results. Aortic Atherosclerosis (ICD10-I70.0). Electronically Signed   By: Zetta Bills M.D.   On: 12/31/2019 19:38   DG Chest 1 View  Result Date: 01/01/2020 CLINICAL DATA:  LEFT pleural effusion post thoracentesis EXAM: CHEST  1 VIEW COMPARISON:  Portable exam 1138 hours  compared to 09/18/2019 FINDINGS: Dual lumen RIGHT jugular catheter with tip projecting over cavoatrial junction. Enlargement of cardiac silhouette with pulmonary vascular congestion. Bibasilar atelectasis. Lungs otherwise clear. No gross pleural effusion or pneumothorax. IMPRESSION: No pneumothorax following LEFT thoracentesis. Electronically Signed   By: Lavonia Dana M.D.   On: 01/01/2020 13:51   US Paracentesis  Result Date: 01/01/2020 INDICATION: Alcoholic cirrhosis, end-stage renal disease on dialysis, ascites, question spontaneous bacterial peritonitis EXAM: ULTRASOUND GUIDED DIAGNOSTIC PARACENTESIS MEDICATIONS: None COMPLICATIONS: None immediate PROCEDURE: Informed written consent was obtained from the patient after a discussion of the risks, benefits and alternatives to treatment. A timeout was performed prior to the initiation of the procedure. Initial ultrasound scanning demonstrates a large amount of ascites within the LEFT lower abdominal quadrant. The right lower abdomen was prepped and draped in the usual sterile fashion. 1% lidocaine was used for local anesthesia. Following this, a 5 [place] Yueh catheter was introduced. An ultrasound image was saved for documentation purposes. The paracentesis was performed.  The catheter was removed and a dressing was applied. The patient tolerated the procedure well without immediate post procedural complication. FINDINGS: A total of approximately 240 mL of yellow ascitic fluid was removed. Samples were sent to the laboratory as requested by the clinical team. IMPRESSION: Successful ultrasound-guided paracentesis yielding 240 mL of peritoneal fluid. Electronically Signed   By: Lavonia Dana M.D.   On: 01/01/2020 14:13   US THORACENTESIS ASP PLEURAL SPACE W/IMG GUIDE  Result Date: 01/01/2020 INDICATION: Pleural effusion, history alcoholic cirrhosis, atrial fibrillation, end-stage renal disease on dialysis, diabetes mellitus, hypertension EXAM: ULTRASOUND GUIDED  DIAGNOSTIC LEFT THORACENTESIS MEDICATIONS: None. COMPLICATIONS: None immediate. PROCEDURE: An ultrasound guided thoracentesis was thoroughly discussed with the patient and questions answered. The benefits, risks, alternatives and complications were also discussed. The patient understands and wishes to proceed with the procedure. Written consent was obtained. Ultrasound was performed to localize and mark an adequate pocket of fluid in the LEFT chest. The area was then prepped and draped in the normal sterile fashion. 1% Lidocaine was used for local anesthesia. Under ultrasound guidance a 5 [place] Yueh catheter was introduced. Thoracentesis was performed. The catheter was removed and a dressing applied. FINDINGS: A total of approximately 240 mL of dark yellow LEFT pleural fluid was removed. Samples were sent to the laboratory as requested by the clinical team. IMPRESSION: Successful ultrasound guided diagnostic LEFT thoracentesis yielding 240 mL of pleural fluid. Electronically Signed   By: Lavonia Dana M.D.   On: 01/01/2020 13:50        Scheduled Meds: . furosemide  40 mg Oral BID  . insulin aspart  0-9 Units Subcutaneous Q4H  . iron polysaccharides  150 mg Oral QHS  . lactulose  20 g Oral TID  . metoprolol succinate  50 mg Oral BID  . povidone-iodine      . pravastatin  20 mg Oral QPM  . rifaximin  550 mg Oral BID  . spironolactone  50 mg Oral q AM  . tamsulosin  0.4 mg Oral QPM  . [START ON 01/02/2020] thiamine  100 mg Oral q AM  . venlafaxine  75 mg Oral TID WC  . vitamin B-12  1,000 mcg Oral Daily    LOS: 1 day    Time spent: 35 minutes    Takiera Mayo Darleen Crocker, DO [facility]  If 7PM-7AM, please contact night-coverage [email or web address] 01/01/2020, 2:33 PM

## 2020-01-01 NOTE — Consult Note (Signed)
Consultation Note Date: 01/04/2020  Patient Name: Jeffrey [name]  DOB: [birth year]  MRN: [id number]  Age / Sex: 74 y.o., male  PCP: Monico Blitz, MD Referring Physician: Rodena Goldmann, DO  Reason for Consultation: Establishing goals of care  HPI/Patient Profile: 74 y.o. 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 Found with hyperammononemia, moderate left and right pleural effusion (IR consulted for thoracentesis), hypoglycemia 20s requiring D10 infusion. Of note I did have goals of care conversation with wife 12/14/19 as patient was lethargic/confused and unable to participate.  S/P L thoracentesis and paracentesis 01/01/20. Spiking fevers.  Clinical Assessment and Goals of Care: I met today at "[name]" bedside but no family present. He is more alert and able to reiterate some information to me. He was able to tell me that Dr. Dyann Kief was just in here. However, he has some ongoing confusion and forgetfulness about the details of his illness and hospitalization. He expresses desire and hope for improvement and plans to try and eat/drink well and comply with medical recommendations. He is today just beginning to show some improvement. He has been critically ill. I expressed to JT that he has been seriously ill and we were not sure he would recover. I tell him that this is going to be a difficult and serious pathway for him as these medical issues are not going to improve or go away unfortunately. He continues to be lethargic and struggles to stay awake for this conversation. I tell him that because of the severity of his liver and kidneys he needs to talk more with his wife about his  wishes and if he gets to a point where he is sick and not improving. He agrees this would be helpful.   I will follow up Monday if still here. Recommend outpatient palliative care to follow and continue discussions.   Primary Decision Maker PATIENT    SUMMARY OF RECOMMENDATIONS   - DNR already established.  - Will need ongoing [place] conversations - more helpful when patient more awake and oriented to participate in discussions.   Code Status/Advance Care Planning:  DNR   Symptom Management:   Per attending.   Palliative Prophylaxis:   Aspiration, Bowel Regimen, Delirium Protocol, Frequent Pain Assessment, Oral Care and Turn Reposition  Prognosis:   < 6 months very likely.   Discharge Planning: To Be Determined      Primary Diagnoses: Present on Admission: . (Resolved) Hepatic encephalopathy ([place]) . Alcoholic cirrhosis of liver with ascites ([place]) . Hyperkalemia . Hyponatremia . 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[place]) . Hepatic encephalopathy ([place])   I have reviewed the medical record, interviewed the patient and family, and examined the patient. The following aspects are pertinent.  Past Medical History:  Diagnosis Date  . A-fib [facility])    when initially stating  dialysis january 2021 at [facility] per spouse; never heard anything else about it  . Alcoholic cirrhosis ([place])    patient reports completing hep a and b vaccines in 2006  . Anemia    has had 3 units of prbcs 2013  . Anxiety   . Arthritis   . Asymptomatic gallstones    Ultrasound in 2006  . C. difficile colitis   . Cholelithiasis   . Chronic kidney disease    Dialysis M/W/F/Sa  . Depression   . Diabetes ([place])   . Duodenal ulcer with hemorrhage    per patient in 2006 or 2007, records have been requested  . Dyspnea    low oxygen level -  has O2 2 L all day  . 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 GERD (gastroesophageal reflux disease)   . Heart burn   . History of alcohol abuse    quit  05/2013  . HOH (hard of hearing)   . Hypertension   . Liver cirrhosis ([place])   . Splenomegaly    Ultrasound in 2006   Social History   Socioeconomic History  . Marital status: Married    Spouse name: Not on file  . Number of children: 2  . Years of education: Not on file  . Highest education level: Not on file  Occupational History  . Occupation: RETIRED    Employer: SELF EMPLOYED  Tobacco Use  . Smoking status: Former Smoker    Packs/day: 2.00    Years: 25.00    Pack years: 50.00    Types: Cigarettes    Quit date: 01/28/1986    Years since quitting: 33.9  . Smokeless tobacco: Never Used  Vaping Use  . Vaping Use: Never used  Substance and Sexual Activity  . Alcohol use: No    Comment: quit in 05/2013  . Drug use: No  . Sexual activity: Not Currently  Other Topics Concern  . Not on file  Social History Narrative   Two step children from second marriage (divorced) who live with him along with some grandchildren.    Social Determinants of Health   Financial Resource Strain:   . Difficulty of Paying Living Expenses:   Food Insecurity:   . Worried About Charity fundraiser in the Last Year:   . Arboriculturist in the Last Year:   Transportation Needs:   . Film/video editor (Medical):   [name] Lack of Transportation (Non-Medical):   Physical Activity:   . Days of Exercise per Week:   . Minutes of Exercise per Session:   Stress:   . Feeling of Stress :   Social Connections:   . Frequency of Communication with Friends and Family:   . Frequency of Social Gatherings with Friends and Family:   . Attends Religious Services:   . Active Member of Clubs or Organizations:   . Attends Archivist Meetings:   [name] Marital Status:    Family History  Problem Relation Age of Onset  . Colon cancer Father 10       deceased age 48  . Breast cancer Sister        deceased  . Diabetes Sister   . Stroke Mother   . Healthy Son   . Healthy Daughter   . Lung cancer Neg  Hx   . Ovarian cancer Neg Hx    Scheduled Meds: . dextrose  1 ampule Intravenous Once  . furosemide  80 mg  Oral q AM  . insulin aspart  0-9 Units Subcutaneous Q4H  . iron polysaccharides  150 mg Oral QHS  . lactulose  20 g Oral TID  . metoprolol succinate  25 mg Oral Daily  . pravastatin  20 mg Oral QPM  . rifaximin  200 mg Oral BID  . spironolactone  50 mg Oral q AM  . tamsulosin  0.4 mg Oral QPM  . venlafaxine  75 mg Oral TID WC   Continuous Infusions: PRN Meds:. Allergies  Allergen Reactions  . Chlorhexidine   . Tape Itching and Rash   Review of Systems  Constitutional: Positive for activity change, appetite change and fatigue.  Respiratory: Negative for shortness of breath.   Gastrointestinal: Positive for abdominal distention.  Neurological: Positive for weakness.    Physical Exam Vitals and nursing note reviewed.  Constitutional:      General: He is not in acute distress.    Appearance: He is ill-appearing.  Cardiovascular:     Rate and Rhythm: Normal rate.  Pulmonary:     Effort: Pulmonary effort is normal. No tachypnea, accessory muscle usage or respiratory distress.  Abdominal:     General: There is no distension.     Palpations: Abdomen is soft.  Musculoskeletal:     Right lower leg: 1+ Edema present.     Left lower leg: 1+ Edema present.  Neurological:     Mental Status: He is alert and easily aroused. He is confused.     Comments: Somewhat oriented but very forgetful; poor insight and judgement     Vital Signs: BP (!) 143/74   Pulse 90   Temp 98.3 F (36.8 C) (Oral)   Resp 20   Ht _0  (1.702 m)   Wt (!) 108.2 kg   SpO2 98%   BMI 37.36 kg/m  Pain Scale: 0-10   Pain Score: 10-Worst pain ever   SpO2: SpO2: 98 % O2 Device:SpO2: 98 % O2 Flow Rate: .O2 Flow Rate (L/min): 2 L/min  IO: Intake/output summary:   Intake/Output Summary (Last 24 hours) at 01/01/2020 6270 Last data filed at 01/01/2020 3500 Gross per 24 hour  Intake 450 ml    Output 1300 ml  Net -850 ml    LBM:   Baseline Weight: Weight: (!) 108.2 kg Most recent weight: Weight: (!) 108.2 kg     Palliative Assessment/Data:     Time In: 0900 Time Out: 0930 Time Total: 30 min Greater than 50%  of this time was spent counseling and coordinating care related to the above assessment and plan.  Signed by: Vinie Sill, NP Palliative Medicine Team Pager # [phone] (M-F 8a-5p) Team Phone # [phone] (Nights/Weekends)

## 2020-01-01 NOTE — Progress Notes (Signed)
Paracentesis complete no signs of distress.  

## 2020-01-01 NOTE — Consult Note (Signed)
Referring Provider: Dr. Heath Lark, hospitalist Primary Care Physician:  Monico Blitz, MD Primary Gastroenterologist:  Dr. Gala Romney   Date of Admission: 12/31/19 Date of Consultation: 01/01/20  Reason for Consultation:  Cirrhosis with encephalopathy and ascites  HPI:  Jeffrey [name] is a 74 y.o. year old male with history of alcoholic cirrhosis, ESRD on dialysis, esophageal varices Grade 1 and 2 in April 2021 but unable to tolerate non-selective beta blocker, hepatic encephalopathy, history of SBP at outside facility Nov 2020, transfusion dependent anemia, Cdiff in Dec 2021 with recurrence in Feb 2021, presenting yesterday evening with abdominal pain and confusion.   Very limited historian but oriented to person, place, and year. He is unsure why he was brought to the ED. Majority of information was obtained via chart. Reportedly 2 week history of intermittent RUQ and lower abdominal pain, worsening over past few days. Unable to provide adequate history at time of presentation to hospital. He tells me that he has no abdominal pain but feels "pressure" and bloating like he needs to have a BM. Denies any overt GI bleeding. No N/V. Denies confusion to me although he is unsure why he is in the hospital. No ETOH use.    Unable to stay on propanolol for variceal bleeding prophylaxis with history of SBP due to history of Cdiff. Denies diarrhea or constipation.   CT abd/pelvis without contrast yesterday with moderate LEFT and small RIGHT pleural effusion with greatest basil airspace disease on left. Calcification of periphery of the SMV and portal venous system. Focal area of low attenuation in the RIGHT hemi liver. Small volume ascites in left abdomen, ?question of loculation. Cholelithiasis with collapsed gallbladder.   Discussed with Nephrology need for CT with IV contrast for further assessment of SMV and liver. May pursue CT with IV contrast and dialysis will be later today.   CT abd/pelvis with and  without contrast today with unchanged, irregular subcapsular hypodensity of anterior dome of liver without suspicious findings, unchanged dating back to 2017. Eccentric calcification of central portions of SMV near confluence with portal vein but no significant intraluminal thrombus, likely representing sequelae of prior thrombosis without significant residual. Small volume ascites in abdomen and pelvis.   US thoracentesis has been ordered. [place] para also ordered for diagnostic only purposes.   Past Medical History:  Diagnosis Date  . A-fib [facility])    when initially stating dialysis january 2021 at [facility] per spouse; never heard anything else about it  . Alcoholic cirrhosis ([place])    patient reports completing hep a and b vaccines in 2006  . Anemia    has had 3 units of prbcs 2013  . Anxiety   . Arthritis   . Asymptomatic gallstones    Ultrasound in 2006  . C. difficile colitis   . Cholelithiasis   . Chronic kidney disease    Dialysis M/W/F/Sa  . Depression   . Diabetes ([place])   . Duodenal ulcer with hemorrhage    per patient in 2006 or 2007, records have been requested  . Dyspnea    low oxygen level -  has O2 2 L all day  . 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 GERD (gastroesophageal reflux disease)   . Heart burn   . 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 HOH (hard of hearing)   . Hypertension   . Liver cirrhosis ([place])   . Splenomegaly    Ultrasound in 2006  Past Surgical History:  Procedure Laterality Date  . AGILE CAPSULE N/A 09/20/2019   Procedure: AGILE CAPSULE;  Surgeon: Daneil Dolin, MD;  [facility];  Service: Endoscopy;  Laterality: N/A;  . APPENDECTOMY    . AV FISTULA PLACEMENT Left 08/17/2019   Procedure: ARTERIOVENOUS (AV) FISTULA CREATION VERSES ARTERIOVENOUS GRAFT;  Surgeon: [name], MD;  Location: [place];  Service: Vascular;  Laterality: Left;  . BASCILIC VEIN TRANSPOSITION Left 09/28/2019   Procedure: LEFT SECOND STAGE  BASCILIC VEIN TRANSPOSITION;  Surgeon: [name], MD;  Location: [place];  Service: Vascular;  Laterality: Left;  . BIOPSY  02/03/2018   Procedure: BIOPSY;  Surgeon: Daneil Dolin, MD;  [facility];  Service: Endoscopy;;  bx of gastric polyps  . BIOPSY  09/19/2019   Procedure: BIOPSY;  Surgeon: Danie Binder, MD;  [facility];  Service: Endoscopy;;  . CATARACT EXTRACTION Right   . COLONOSCOPY  03/10/2005   Rectal polyp as described above, removed with snare. Left sided  diverticula. The remainder of the colonic mucosa appeared normal. Inflammed polyp on path.  . COLONOSCOPY  04/1999   Dr. Thea Silversmith polyps removed,  Path showed hyperplastic  . COLONOSCOPY  10/2015   Dr. Britta Mccreedy: diverticulosis, single sessile tubular adenoma 3-67mm in size removed from descending colon.   . COLONOSCOPY WITH ESOPHAGOGASTRODUODENOSCOPY (EGD)  03/31/2012   VPX:TGGYIRS AVMs. Colonic diverticulosis. tubular adenoma colon  . COLONOSCOPY WITH ESOPHAGOGASTRODUODENOSCOPY (EGD)  06/2019   FORSYTH: small esophageal varices without high risk stigmata, single large AVM on lesser curvature in gastric body s/p APC ablation, gastric antral and duodenal bulb polyposis. No significant source to explain transfusion dependent anemia. Colonoscopy with portal colopathy and diffuse edema, changes of Cdiff colitis on colonoscopy  . ESOPHAGEAL BANDING N/A 02/03/2018   Procedure: ESOPHAGEAL BANDING;  Surgeon: Daneil Dolin, MD;  [facility];  Service: Endoscopy;  Laterality: N/A;  . ESOPHAGOGASTRODUODENOSCOPY  01/15/2005   Three columns grade 1 to 2 esophageal varices, otherwise normal esophageal mucosa.  Esophagus was not manipulated otherwise./Nodularity of the antrum with overlying erosions, nonspecific finding. Path showed rare H.pylori  . ESOPHAGOGASTRODUODENOSCOPY  09/2008   Dr. Gaylene Brooks columns of grade 2-3 esoph varices, only one column was prominent. Portal gastropathy, multiple gastrc  polyps at antrum, two were 2cm with black eschar, bulbar polyps, bulbar erosions  . ESOPHAGOGASTRODUODENOSCOPY  11/2004   Dr. Brantley Stage 3 esoph varices  . ESOPHAGOGASTRODUODENOSCOPY  03/31/2012   RMR: 4 columns(3-GR 2, 1-GR1) non-bleeding esophageal varices, portal gastropathy, small HH, early GAVE, multiple gastric polyps   . ESOPHAGOGASTRODUODENOSCOPY (EGD) WITH PROPOFOL N/A 02/03/2018   Dr. Gala Romney: Esophageal varices, 3 columns grade 1-2.  Portal hypertensive gastropathy.  Multiple gastric polyps, biopsy consistent with hyperplastic.  [name] ESOPHAGOGASTRODUODENOSCOPY (EGD) WITH PROPOFOL N/A 09/19/2019   Fields: grade I and II esophageal varcies, mild portal hypertensive gastropathy, moderate gastritis but no H. pylori, single hyperplastic gastric polyp removed, obvious source for melena/transfusion dependent anemia not identified, may be due to friable gastric and duodenal mucosa in the setting of portal hypertension  . GASTRIC VARICES BANDING  03/31/2012   Procedure: GASTRIC VARICES BANDING;  Surgeon: Daneil Dolin, MD;  [facility];  Service: Endoscopy;  Laterality: N/A;  . GIVENS CAPSULE STUDY N/A 09/21/2019   Poor study with a lot of debris obstructing much of the view of the first approximate 3 hours out of 6 hours.  No obvious source of bleeding identified.  Sequela  of bleeding and old blood seen in the form of"whisps" of blood, blood flecks mostly toward the end of the study.  [name] POLYPECTOMY  09/19/2019   Procedure: POLYPECTOMY;  Surgeon: Danie Binder, MD;  [facility];  Service: Endoscopy;;  . UMBILICAL HERNIA REPAIR  2017   exploratory laparotomy, abdominal washout    Prior to Admission medications   Medication Sig Start Date End Date Taking? Authorizing Provider  Ascorbic Acid (VITAMIN C) 1000 MG tablet Take 1,000 mg by mouth 2 (two) times daily.    [provider]  furosemide (LASIX) 80 MG tablet Take 80 mg by mouth in the morning.  08/07/19    [provider]  gabapentin (NEURONTIN) 100 MG capsule TAKE 1 CAPSULE BY MOUTH AT BEDTIME 10/05/19   Waynetta Sandy, MD  HUMALOG KWIKPEN 100 UNIT/ML KwikPen as needed. Sliding scale 11/05/19   [provider]  HYDROcodone-acetaminophen (NORCO) 10-325 MG tablet Take 1 tablet by mouth every 6 (six) hours as needed for moderate pain. Patient taking differently: Take 1 tablet by mouth as needed for moderate pain.  09/28/19   Rhyne, Hulen Shouts, PA-C  insulin degludec (TRESIBA FLEXTOUCH) 200 UNIT/ML FlexTouch Pen Inject 120 Units into the skin at bedtime. When blood sugars are up    [provider]  iron polysaccharides (NIFEREX) 150 MG capsule Take 150 mg by mouth at bedtime.     [provider]  lactulose (CHRONULAC) 10 GM/15ML solution Take 45 mLs (30 g total) by mouth 2 (two) times daily. 12/17/19   Rai, Vernelle Emerald, MD  metoprolol succinate (TOPROL-XL) 25 MG 24 hr tablet Take 25 mg by mouth daily. Only on Monday ,Wednesday and Friday 11/08/19   [provider]  milk thistle 175 MG tablet Take 175 mg by mouth 3 (three) times daily.     [provider]  Multiple Vitamin (MULTIVITAMIN WITH MINERALS) TABS tablet Take 1 tablet by mouth in the morning.     [provider]  omega-3 acid ethyl esters (LOVAZA) 1 g capsule Take 2 g by mouth 2 (two) times daily.  04/07/19   [provider]  omeprazole (PRILOSEC) 40 MG capsule Take 40 mg by mouth in the morning.     [provider]  pravastatin (PRAVACHOL) 20 MG tablet Take 20 mg by mouth in the morning.     [provider]  Probiotic Product (PROBIOTIC DAILY PO) Take 1 tablet by mouth in the morning.     [provider]  rifaximin (XIFAXAN) 550 MG TABS tablet Take 1 tablet (550 mg total) by mouth 2 (two) times daily. 01/13/19   Mahala Menghini, PA-C  spironolactone (ALDACTONE) 50 MG tablet Take 50 mg by mouth in the morning.     [provider]   tamsulosin (FLOMAX) 0.4 MG CAPS capsule Take 0.4 mg by mouth every evening.    [provider]  thiamine 100 MG tablet Take 100 mg by mouth in the morning. Vitamin B-1    [provider]  tiZANidine (ZANAFLEX) 2 MG tablet Take 2 mg by mouth as needed for muscle spasms.     [provider]  triamcinolone cream (KENALOG) 0.1 % Apply 1 application topically 2 (two) times daily as needed (dry skin Itching).  12/30/17   [provider]  venlafaxine (EFFEXOR) 75 MG tablet Take 75 mg by mouth 3 (three) times daily with meals.     [provider]  vitamin B-12 (CYANOCOBALAMIN) 1000 MCG tablet  Take 1,000 mcg by mouth daily.    [provider]    Current Facility-Administered Medications  Medication Dose Route Frequency Provider Last Rate Last Admin  . dextrose 50 % solution 50 mL  1 ampule Intravenous Once Adefeso, Oladapo, DO      . furosemide (LASIX) tablet 40 mg  40 mg Oral BID Manuella Ghazi, Pratik D, DO      . insulin aspart (novoLOG) injection 0-9 Units  0-9 Units Subcutaneous Q4H Adefeso, Oladapo, DO      . iron polysaccharides (NIFEREX) capsule 150 mg  150 mg Oral QHS Shah, Pratik D, DO      . lactulose (CHRONULAC) 10 GM/15ML solution 20 g  20 g Oral TID Adefeso, Oladapo, DO   20 g at 01/01/20 0956  . metoprolol succinate (TOPROL-XL) 24 hr tablet 50 mg  50 mg Oral BID Manuella Ghazi, Pratik D, DO      . pravastatin (PRAVACHOL) tablet 20 mg  20 mg Oral QPM Manuella Ghazi, Pratik D, DO      . rifaximin Doreene Nest) tablet 550 mg  550 mg Oral BID Manuella Ghazi, Pratik D, DO      . spironolactone (ALDACTONE) tablet 50 mg  50 mg Oral q AM Manuella Ghazi, Pratik D, DO      . tamsulosin (FLOMAX) capsule 0.4 mg  0.4 mg Oral QPM Manuella Ghazi, Pratik D, DO      . venlafaxine [name]) tablet 75 mg  75 mg Oral TID WC Shah, Pratik D, DO   75 mg at 01/01/20 3500   Current Outpatient Medications  Medication Sig Dispense Refill  . Ascorbic Acid (VITAMIN C) 1000 MG tablet Take 1,000 mg by mouth 2 (two) times  daily.    . ciprofloxacin (CIPRO) 500 MG tablet Take 500 mg by mouth daily.    . furosemide (LASIX) 40 MG tablet Take 40 mg by mouth 2 (two) times daily.    [name] gabapentin (NEURONTIN) 100 MG capsule TAKE 1 CAPSULE BY MOUTH AT BEDTIME 90 capsule 0  . HUMALOG KWIKPEN 100 UNIT/ML KwikPen as needed. Sliding scale    . HYDROcodone-acetaminophen (NORCO) 10-325 MG tablet Take 1 tablet by mouth every 6 (six) hours as needed for moderate pain. (Patient taking differently: Take 1 tablet by mouth as needed for moderate pain. ) 20 tablet 0  . HYDROcodone-acetaminophen (NORCO/VICODIN) 5-325 MG tablet Take 0.5-1 tablets by mouth 2 (two) times daily.    . hydrOXYzine (VISTARIL) 25 MG capsule Take 25 mg by mouth 3 (three) times daily as needed.    . insulin degludec (TRESIBA FLEXTOUCH) 200 UNIT/ML FlexTouch Pen Inject 120 Units into the skin at bedtime. When blood sugars are up    . iron polysaccharides (NIFEREX) 150 MG capsule Take 150 mg by mouth at bedtime.     [name] lactulose (CHRONULAC) 10 GM/15ML solution Take 45 mLs (30 g total) by mouth 2 (two) times daily. 236 mL 0  . metoprolol succinate (TOPROL-XL) 50 MG 24 hr tablet Take 50 mg by mouth 2 (two) times daily.    . milk thistle 175 MG tablet Take 175 mg by mouth 3 (three) times daily.     . mirtazapine (REMERON) 15 MG tablet Take 15 mg by mouth at bedtime.    . Multiple Vitamin (MULTIVITAMIN WITH MINERALS) TABS tablet Take 1 tablet by mouth in the morning.     [name] omega-3 acid ethyl esters (LOVAZA) 1 g capsule Take 2 g by mouth 2 (two) times daily.     [name] omeprazole (PRILOSEC) 40 MG  capsule Take 40 mg by mouth in the morning.     . pravastatin (PRAVACHOL) 20 MG tablet Take 20 mg by mouth in the morning.     . Probiotic Product (PROBIOTIC DAILY PO) Take 1 tablet by mouth in the morning.     . rifaximin (XIFAXAN) 550 MG TABS tablet Take 1 tablet (550 mg total) by mouth 2 (two) times daily. 60 tablet 11  . spironolactone (ALDACTONE) 50 MG tablet Take 50 mg by mouth  in the morning.     . thiamine 100 MG tablet Take 100 mg by mouth in the morning. Vitamin B-1    . tiZANidine (ZANAFLEX) 2 MG tablet Take 2 mg by mouth as needed for muscle spasms.     [name] triamcinolone cream (KENALOG) 0.1 % Apply 1 application topically 2 (two) times daily as needed (dry skin Itching).   3  . venlafaxine (EFFEXOR) 75 MG tablet Take 75 mg by mouth 3 (three) times daily with meals.     . vitamin B-12 (CYANOCOBALAMIN) 1000 MCG tablet Take 1,000 mcg by mouth daily.    . tamsulosin (FLOMAX) 0.4 MG CAPS capsule Take 0.4 mg by mouth every evening.      Allergies as of 12/31/2019 - Review Complete 12/31/2019  Allergen Reaction Noted  . Chlorhexidine  09/19/2019  . Tape Itching and Rash 09/18/2019    Family History  Problem Relation Age of Onset  . Colon cancer Father 14       deceased age 85  . Breast cancer Sister        deceased  . Diabetes Sister   . Stroke Mother   . Healthy Son   . Healthy Daughter   . Lung cancer Neg Hx   . Ovarian cancer Neg Hx     Social History   Socioeconomic History  . Marital status: Married    Spouse name: Not on file  . Number of children: 2  . Years of education: Not on file  . Highest education level: Not on file  Occupational History  . Occupation: RETIRED    Employer: SELF EMPLOYED  Tobacco Use  . Smoking status: Former Smoker    Packs/day: 2.00    Years: 25.00    Pack years: 50.00    Types: Cigarettes    Quit date: 01/28/1986    Years since quitting: 33.9  . Smokeless tobacco: Never Used  Vaping Use  . Vaping Use: Never used  Substance and Sexual Activity  . Alcohol use: No    Comment: quit in 05/2013  . Drug use: No  . Sexual activity: Not Currently  Other Topics Concern  . Not on file  Social History Narrative   Two step children from second marriage (divorced) who live with him along with some grandchildren.    Social Determinants of Health   Financial Resource Strain:   . Difficulty of Paying Living  Expenses:   Food Insecurity:   . Worried About Charity fundraiser in the Last Year:   . Arboriculturist in the Last Year:   Transportation Needs:   . Film/video editor (Medical):   [name] Lack of Transportation (Non-Medical):   Physical Activity:   . Days of Exercise per Week:   . Minutes of Exercise per Session:   Stress:   . Feeling of Stress :   Social Connections:   . Frequency of Communication with Friends and Family:   . Frequency of Social Gatherings with Friends and Family:   .  Attends Religious Services:   . Active Member of Clubs or Organizations:   . Attends Archivist Meetings:   [name] Marital Status:   Intimate Partner Violence:   . Fear of Current or Ex-Partner:   . Emotionally Abused:   [name] Physically Abused:   . Sexually Abused:     Review of Systems: Gen: see HPI CV: Denies chest pain, heart palpitations, syncope, edema  Resp: Denies shortness of breath with rest, cough, wheezing GI: see HPI GU : Denies urinary burning, urinary frequency, urinary incontinence.  MS: Denies joint pain,swelling, cramping Derm: Denies rash, itching, dry skin Psych: see HPI Heme: Denies bruising, bleeding, and enlarged lymph nodes.  Physical Exam: Vital signs in last 24 hours: Temp:  [98.3 F (36.8 C)] 98.3 F (36.8 C) (08/01 1554) Pulse Rate:  [71-94] 90 (08/02 0830) Resp:  [15-23] 20 (08/02 0600) BP: (140-177)/(55-83) 143/74 (08/02 0830) SpO2:  [94 %-100 %] 98 % (08/02 0830) Weight:  [108.2 kg] 108.2 kg (08/01 1556)   General:   Alert, chronically ill-appearing, oriented to person, place, but not situation.  Head:  Normocephalic and atraumatic. Eyes:  Sclera clear, no icterus.    Ears:  Normal auditory acuity. Nose:  No deformity, discharge,  or lesions. Lungs:  Scattered rhonchi, slight accessory use of abdominal muscles with breathing Heart:  S1 S2 present without obvious murmur Abdomen:  Obese, +BS, full but without tense ascites, extensive midline scar, no  TTP, rebound or guarding Rectal:  Deferred  Msk:  Symmetrical without gross deformities. Normal posture. Extremities:  With trace pretibial edema, chronic venous stasis changes Neurologic:  Alert and  Oriented to person and place, unsure situation Psych:  Flat affect  Intake/Output from previous day: 08/01 0701 - 08/02 0700 In: 450 [I.V.:450] Out: 1300 [Urine:1300] Intake/Output this shift: No intake/output data recorded.  Lab Results: Recent Labs    12/31/19 1909 01/01/20 0424  WBC 4.0 4.1  HGB 8.3* 8.3*  HCT 27.7* 27.2*  PLT 123* 121*   BMET Recent Labs    12/31/19 1909 01/01/20 0424  NA 123* 126*  K 5.7* 5.7*  CL 93* 93*  CO2 18* 22  GLUCOSE 102* 81  BUN 44* 46*  CREATININE 4.73* 4.81*  CALCIUM 8.5* 8.6*   LFT Recent Labs    12/31/19 1909 01/01/20 0424  PROT 7.1 6.9  ALBUMIN 3.1* 3.0*  AST 29 26  ALT 25 24  ALKPHOS 174* 163*  BILITOT 0.7 0.8   PT/INR Recent Labs    01/01/20 0424  LABPROT 14.6  INR 1.2    Studies/Results: CT ABDOMEN PELVIS W WO CONTRAST  Result Date: 01/01/2020 CLINICAL DATA:  Cirrhosis, abdominal pain,, 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 CONTRAST:  161mL OMNIPAQUE IOHEXOL 300 MG/ML  SOLN COMPARISON:  CT abdomen pelvis, 12/31/2019 FINDINGS: Lower chest: Unchanged moderate left, small right pleural effusions and associated atelectasis or consolidation. Coronary artery calcifications. Hepatobiliary: Cirrhotic morphology of the liver. There is unchanged, irregular subcapsular hypodensity of the anterior dome of the liver (series 12, image 119) without suspicious contrast enhancement or focal lesion, this appearance not changed compared to prior examinations dating back to 01/10/2016.  Gallstones in the contracted gallbladder. No biliary ductal dilatation. Pancreas: Unremarkable. No pancreatic ductal dilatation or surrounding inflammatory changes. Spleen: Splenomegaly, maximum coronal span  17.0 cm. Adrenals/Urinary Tract: Adrenal glands are unremarkable. Kidneys are normal, without renal calculi, solid lesion, or hydronephrosis. Bladder is decompressed by Foley catheter. Stomach/Bowel: Stomach is within normal limits. Appendix is not clearly visualized. No evidence of bowel wall thickening, distention, or inflammatory changes. Occasional sigmoid diverticula. Vascular/Lymphatic: Aortic atherosclerosis. There is some eccentric calcification of the central portions of the superior mesenteric vein near the confluence with the portal vein; there is no significant intraluminal thrombus (series 12, image 58). No enlarged abdominal or pelvic lymph nodes. Reproductive: No mass or other significant abnormality. Other: No abdominal wall hernia or abnormality. Small volume ascites in the abdomen and pelvis, unchanged compared to prior examination. Musculoskeletal: No acute or significant osseous findings. IMPRESSION: 1. Cirrhotic morphology of the liver. There is unchanged, irregular subcapsular hypodensity of the anterior dome of the liver without suspicious contrast enhancement or focal lesion, this appearance not changed compared to prior examinations dating back to 01/10/2016 and perhaps related to scarring or fibrosis. 2. There is some eccentric calcification of the central portions of the superior mesenteric vein near the confluence with the portal vein; there is no significant intraluminal thrombus. 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 3. Splenomegaly. 4. Cholelithiasis. 5. Small volume ascites in the abdomen and pelvis, unchanged compared to prior examination. 6. Unchanged moderate left, small right pleural effusions and associated atelectasis or  consolidation. 7. Coronary artery disease.  Aortic Atherosclerosis (ICD10-I70.0). Electronically Signed   By: Eddie Candle M.D.   On: 01/01/2020 11:22   CT ABDOMEN PELVIS WO CONTRAST  Result Date: 12/31/2019 CLINICAL DATA:  Abdominal distension, abdominal pain for 2 weeks. EXAM: CT ABDOMEN AND PELVIS WITHOUT CONTRAST TECHNIQUE: Multidetector CT imaging of the abdomen and pelvis was performed following the standard protocol without IV contrast. COMPARISON:  January 10, 2016 FINDINGS: Lower chest: Moderate LEFT and small RIGHT pleural effusion with basilar airspace disease also greatest on the LEFT. Central venous access device terminates at the caval to atrial junction incompletely imaged. No pericardial fluid. Hepatobiliary: Heterogeneity of the RIGHT hepatic lobe showing linear and branching appearance grossly similar to the study of January 10, 2016. Frankly cirrhotic liver. Cholelithiasis. Gallbladder is collapsed. There is generalized stranding throughout the abdomen nonspecific to the gallbladder. Focal low attenuation in the RIGHT hemi liver on image 15 of series 2 2.5 x 3.4 cm. Pancreas: Pancreas is normal without ductal dilation, gross inflammation or contour change from previous imaging study. Spleen: Spleen remains enlarged. Signs of portosystemic collaterals in the upper abdomen. This includes splenorenal shunting. Adrenals/Urinary Tract: Adrenal glands grossly normal though LEFT adrenal is partially obscured by retroperitoneal venous collaterals. No hydronephrosis. No perinephric stranding. Urinary bladder collapsed with Foley catheter in situ. No nephrolithiasis. No ureteral calculi. Stomach/Bowel: Stool throughout the colon. Generalized edema in the abdominal mesenteries. Small volume ascites mainly in the LEFT hemiabdomen. No perienteric or pericolonic stranding. Signs of colonic diverticulosis, mild. Vascular/Lymphatic: Calcified atheromatous plaque in the abdominal aorta. No aneurysmal dilation. No  adenopathy in the retroperitoneum. No adenopathy in the pelvis. 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 Reproductive: Prostate grossly normal by CT. Other: Body wall edema and skin thickening over a lower abdominal pannus. Skin thickening was present previously. Interval abdominal wall reconstruction and repair of umbilical hernia since the prior study. Generalized body wall edema. Musculoskeletal: No acute musculoskeletal process. Spinal degenerative changes. IMPRESSION: 1. Moderate LEFT and small RIGHT pleural effusion with basilar airspace  disease also greatest on the LEFT. Airspace disease likely volume loss associated with effusion. 2. Calcification of the periphery of the SMV and portal venous system can be seen in the setting of chronic thrombosis. CT or multiphase MRI may be helpful as clinically warranted. 3. Cirrhotic liver with signs of portal hypertension. Generalized edema could be also related to liver disease. Focal area of low attenuation in the RIGHT hemi liver may represent confluent fibrosis the given severity of cirrhotic changes however, would suggest follow-up with multiphase CT or MRI as dictated by the patient's renal function. 4. Small volume ascites mainly in the LEFT hemiabdomen. The location of the ascites raising the question of some loculation. No peripheral rind is suggested on noncontrast imaging. Correlate with any clinical symptoms of or history of SBP. 5. Cholelithiasis with collapsed gallbladder and with generalized stranding in the abdomen in the setting of portal hypertension. 6. Interval abdominal wall reconstruction and repair of umbilical hernia since the prior study. 7. Body wall edema and skin thickening over a lower abdominal pannus. Skin thickening was present previously. Correlate with any clinical evidence of cellulitis. 8. Aortic atherosclerosis. These results were called by  telephone at the time of interpretation on 12/31/2019 at 7:44 pm to provider JULIE IDOL , who verbally acknowledged these results. Aortic Atherosclerosis (ICD10-I70.0). Electronically Signed   By: Zetta Bills M.D.   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 He is a limited historian but mental status has improved since admission. Thoracentesis planned today; he has limited amount of ascites, and I doubt SBP at this time but with his history will have diagnostic only para for fluid analysis as well. Unable to rule out hepatic hydrothorax; more commonly would see predominantly right-sided effusions but  it's notable that right-sided effusion is small amount compared to left. Fluid analysis ordered per hospitalist.   CT abd/pelvis WITH contrast completed today for further evaluation of liver and SMV, without significant intramural thrombus and likely sequelae of prior thrombosis. Irregular subcapsular hypodensity of liver is stable dating back to 2017 and likely related to scarring/fibrosis. Dialysis later today; IV contrast was discussed with Nephrology prior to ordering scan.  Encephalopathy: improved. Unclear etiology. Continue with lactulose and Xifaxan, titrating for 3 bowel movements a day.   Transfusion dependent anemia: Hgb remaining stable. Continue with outpatient Hematology follow-up.  Agree with palliative consultation this admission.      Plan: CT abd/pelvis with/without contrast today already completed as  noted above  US thoracentesis with fluid analysis, [place] para only for diagnostic purposes as he is without tense ascites Continue lactulose and Xifaxan Diuretics per Nephrology Dialysis today Await fluid analyses Transfuse as needed, continue outpatient follow-up with Hematology Agree with Palliative involvement   Annitta Needs, PhD, ANP-BC [facility]     LOS: 1 day    01/01/2020, 1:12 PM

## 2020-01-01 NOTE — ED Notes (Signed)
Cleaned up patient, stool episode all up is back and bed and gown. News sheets,gown,diaper,three chucks.

## 2020-01-02 ENCOUNTER — Ambulatory Visit (HOSPITAL_COMMUNITY): Payer: Medicare Other

## 2020-01-02 LAB — COMPREHENSIVE METABOLIC PANEL
ALT: 26 U/L (ref 0–44)
AST: 31 U/L (ref 15–41)
Albumin: 3 g/dL — ABNORMAL LOW (ref 3.5–5.0)
Alkaline Phosphatase: 165 U/L — ABNORMAL HIGH (ref 38–126)
Anion gap: 11 (ref 5–15)
BUN: 25 mg/dL — ABNORMAL HIGH (ref 8–23)
CO2: 24 mmol/L (ref 22–32)
Calcium: 8.5 mg/dL — ABNORMAL LOW (ref 8.9–10.3)
Chloride: 95 mmol/L — ABNORMAL LOW (ref 98–111)
Creatinine, Ser: 3.13 mg/dL — ABNORMAL HIGH (ref 0.61–1.24)
GFR calc Af Amer: 22 mL/min — ABNORMAL LOW (ref >60)
GFR calc non Af Amer: 19 mL/min — ABNORMAL LOW (ref >60)
Glucose, Bld: 45 mg/dL — ABNORMAL LOW (ref 70–99)
Potassium: 4.5 mmol/L (ref 3.5–5.1)
Sodium: 130 mmol/L — ABNORMAL LOW (ref 135–145)
Total Bilirubin: 0.7 mg/dL (ref 0.3–1.2)
Total Protein: 7.2 g/dL (ref 6.5–8.1)

## 2020-01-02 LAB — GLUCOSE, CAPILLARY
Glucose-Capillary: 123 mg/dL — ABNORMAL HIGH (ref 70–99)
Glucose-Capillary: 129 mg/dL — ABNORMAL HIGH (ref 70–99)
Glucose-Capillary: 129 mg/dL — ABNORMAL HIGH (ref 70–99)
Glucose-Capillary: 21 mg/dL — CL (ref 70–99)
Glucose-Capillary: 40 mg/dL — CL (ref 70–99)
Glucose-Capillary: 40 mg/dL — CL (ref 70–99)
Glucose-Capillary: 40 mg/dL — CL (ref 70–99)
Glucose-Capillary: 40 mg/dL — CL (ref 70–99)
Glucose-Capillary: 44 mg/dL — CL (ref 70–99)
Glucose-Capillary: 44 mg/dL — CL (ref 70–99)
Glucose-Capillary: 57 mg/dL — ABNORMAL LOW (ref 70–99)
Glucose-Capillary: 81 mg/dL (ref 70–99)
Glucose-Capillary: 83 mg/dL (ref 70–99)
Glucose-Capillary: 83 mg/dL (ref 70–99)
Glucose-Capillary: 93 mg/dL (ref 70–99)
Glucose-Capillary: 93 mg/dL (ref 70–99)
Glucose-Capillary: 96 mg/dL (ref 70–99)

## 2020-01-02 LAB — CBC
HCT: 26 % — ABNORMAL LOW (ref 39.0–52.0)
Hemoglobin: 7.7 g/dL — ABNORMAL LOW (ref 13.0–17.0)
MCH: 28.7 pg (ref 26.0–34.0)
MCHC: 29.6 g/dL — ABNORMAL LOW (ref 30.0–36.0)
MCV: 97 fL (ref 80.0–100.0)
Platelets: 116 10*3/uL — ABNORMAL LOW (ref 150–400)
RBC: 2.68 MIL/uL — ABNORMAL LOW (ref 4.22–5.81)
RDW: 16.9 % — ABNORMAL HIGH (ref 11.5–15.5)
WBC: 2.9 10*3/uL — ABNORMAL LOW (ref 4.0–10.5)
nRBC: 0 % (ref 0.0–0.2)

## 2020-01-02 LAB — PATHOLOGIST SMEAR REVIEW

## 2020-01-02 LAB — ACID FAST SMEAR (AFB, MYCOBACTERIA): Acid Fast Smear: NEGATIVE

## 2020-01-02 LAB — LACTIC ACID, PLASMA: Lactic Acid, Venous: 1.2 mmol/L (ref 0.5–1.9)

## 2020-01-02 LAB — PH, BODY FLUID: pH, Body Fluid: 7.4

## 2020-01-02 LAB — AMMONIA: Ammonia: 57 umol/L — ABNORMAL HIGH (ref 9–35)

## 2020-01-02 LAB — MRSA PCR SCREENING: MRSA by PCR: NEGATIVE

## 2020-01-02 MED ORDER — LACTULOSE 10 GM/15ML PO SOLN
30.0000 g | Freq: Three times a day (TID) | ORAL | Status: DC
  Administered 2020-01-02 – 2020-01-03 (×2): 30 g via ORAL
  Filled 2020-01-02 (×3): qty 60

## 2020-01-02 MED ORDER — PANTOPRAZOLE SODIUM 40 MG PO TBEC
40.0000 mg | DELAYED_RELEASE_TABLET | Freq: Every day | ORAL | Status: DC
  Administered 2020-01-02 – 2020-01-06 (×5): 40 mg via ORAL
  Filled 2020-01-02 (×5): qty 1

## 2020-01-02 MED ORDER — DEXTROSE 50 % IV SOLN
INTRAVENOUS | Status: AC
  Administered 2020-01-02: 50 mL
  Filled 2020-01-02: qty 50

## 2020-01-02 MED ORDER — DEXTROSE 50 % IV SOLN
INTRAVENOUS | Status: AC
  Administered 2020-01-02: 50 mL via INTRAVENOUS
  Filled 2020-01-02: qty 50

## 2020-01-02 MED ORDER — CHLORHEXIDINE GLUCONATE CLOTH 2 % EX PADS: 6.0000 | MEDICATED_PAD | Freq: Every day | CUTANEOUS | Status: DC

## 2020-01-02 NOTE — Progress Notes (Signed)
Pt got out of bed during shift report and pulled his foley out fully intact, bulb still inflated. He had gotten himself onto the [facility]. There was a lot of blood in floor from penis. Cleaned patient and got him back into bed. Peri care performed with soap and water as well as foley wipes. Loni Muse RN and Aram Candela NT placed new 16 Fr foley using sterile technique. Will continue to monitor patient. Bed alarm is on

## 2020-01-02 NOTE — Progress Notes (Signed)
Pt had an immediate 525 mL of pink urine output with blood clots once new foley placed. Continuing to monitor patient.

## 2020-01-02 NOTE — Progress Notes (Signed)
Pt last CBG was 40. Brought patient a ginger ale but he would not drink it. He is alert and oriented. Administered D50 per standing orders.

## 2020-01-02 NOTE — Progress Notes (Signed)
PROGRESS NOTE    Jeffrey ALA  [name] DOB: 22-Jan-1946 DOA: 12/31/2019 PCP: Monico Blitz, MD   Brief Narrative:  Per HPI: Jeffrey [name] a 74 y.o.malewith medical history significant foralcoholic liver cirrhosis, ESRD(MWF), hypertension, diabetes mellitus type 2, hyperlipidemia, transfusion dependent anemia, portal hypertensive gastropathy, esophageal variceswho presents to the ED due to 2 week onset of intermittent right upper and lower abdominal pain which became worse in the past 4 days. Patient was unable to provide history possibly due to being lethargic (though easily arousable). He only kept repeating that the pain was 7/10 for all questions related to the abdominal pain. History was obtained from the ED physician and ED medical Record. Per report, Pain was sharp in nature and he denied increased abdominal swelling beyond his baseline. He was seen by his PCP (Dr Manuella Ghazi) 2 days ago who asked him to go to the ED for further evaluation but he was hoping that the pain will subside and today, pain worsened and wife noted that patient was more confused today, so he presented to the ED. There was no report of fever, chills, chest pain, SOB, N/V/D.Of note, patient was reported to have missed his dialysis on Friday, last dialysis was on Wednesday (12/27/2019).  8/2: Patient was admitted with acute hepatic encephalopathy in the setting of hyperammonemia.  He is noted to have missed hemodialysis sessions and is not compliant with his home medications.  He has undergone thoracentesis of left pleural effusion today as well as paracentesis.  Plans for hemodialysis today as well.  Palliative consultation ordered.  8/3: Patient appears to have ongoing confusion and still does not feel too well.  Ammonia levels downtrending.  Plan to transfer to telemetry as he is stable from cardiorespiratory standpoint.  Monitor for need for transfusion.  Further hemodialysis in a.m.  Palliative consultation ongoing  and monitoring blood glucose levels which continue to remain low.  May need D5 infusion if blood glucose remains low.  Assessment & Plan:   Principal Problem:   Acute hepatic encephalopathy Active Problems:   Alcoholic cirrhosis of liver with ascites (HCC)   Type 2 diabetes mellitus (HCC)   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HCC)   Hyperkalemia   Hyponatremia   Pleural effusion   Hypoglycemia   Hyperammonemia (HCC)   Obesity (BMI 30-39.9)   Hypoalbuminemia   Hepatic encephalopathy (HCC)   Acute hepatic encephalopathy in the setting of hyperammonemia-persistent Ammonia level 86>66>57,lactulose was given in the ED, we shall continue with this and titrate to 2-3 loose bowel movements daily -not compliant with home lactulose due to excessive bowel movements Continue rifaximin Continue to monitor ammonia with morning labs -Appreciate GI recommendations to continue rifaximin and increase lactulose dose to 30 g 3 times daily for now. -PPI resumed today -Underwent paracentesis today with labs pending, no sign of SBP -Plan to remove port by end of hospitalization  Moderate left and right pleural effusion CT abdomen pelvis without contrast showed moderate left and small right pleural effusion with basilar airspace disease greatest on the left. -Underwent left-sided thoracentesis on 8/2 -2D echocardiogram ordered 8/2 with LVEF 70-75% and no other acute findings  ESRD on HD (MWF) Patient missed dialysis on Friday Last dialysis was on Wednesday (12/27/2019). Last hemodialysis 8/2 with repeat plans for 8/4  Hypoglycemia-intermittent in setting of type 2 diabetes Patient presents with blood glucose in the high 20s, and IV D50 x1 was given Patient was started on D5 W at 75 with blood glucose stable ranging  in the 60s, this was changed to D10W Continue Accu-Cheks and hypoglycemic protocol Continue to monitor blood glucose level and treat accordingly -Renal diet initiated, continue to  monitor and may require D5 infusion if he continues to have episodes of hypoglycemia -Holding home insulin  Hyponatremia-improved Na 130,possibly due to poor solute intake and missing hemodialysis Improved after hemodialysis 8/2 with plans for repeat hemodialysis 8/4  Hyperkalemia K+ 5.7,no EKG changes This will be corrected with hemodialysis  History of alcoholic cirrhosis of liver with ascites CT abdomen pelvis without contrast showed cirrhotic liver with signs of portal hypertension -No further alcohol use, confirmed with wife -Underwent paracentesis 8/2 with 22mL; fluid analysis pending  Hypoalbuminemia Alb 3.0,possibly related to patient's history of cirrhosis team Consider protein supplement when patient is able to tolerate p.o. diet discussed  Transfusion dependent anemia -Currently downtrending and may require transfusion -No overt bleeding noted -Repeat CBC in a.m.  Class II obesity (BMI 31.36) Patient will be counseled on diet and exercise when more stable   DVT prophylaxis: SCDs Code Status: Full code Family Communication: Discussed with wife on phone 8/3 Disposition Plan:   Status is: Inpatient  Remains inpatient appropriate because:Ongoing diagnostic testing needed not appropriate for outpatient work up, IV treatments appropriate due to intensity of illness or inability to take PO and Inpatient level of care appropriate due to severity of illness   Dispo: The patient is from: Home  Anticipated d/c is to: Home  Anticipated d/c date is: 3 days  Patient currently is not medically stable to d/c.  Plan to transfer to telemetry if stable enough.  Lactulose dose increased.  Continue to monitor for hypoglycemia.  Consider transfusion as needed.  Consultants:   Nephrology  GI  Palliative care  Procedures:   Left-sided thoracentesis as well as paracentesis performed 8/2  Antimicrobials:  Anti-infectives  (From admission, onward)   Start     Dose/Rate Route Frequency Ordered Stop   01/01/20 1100  rifaximin (XIFAXAN) tablet 550 mg     Discontinue     550 mg Oral 2 times daily 01/01/20 1038     01/01/20 0115  rifaximin (XIFAXAN) tablet 200 mg  Status:  Discontinued        200 mg Oral 2 times daily 01/01/20 0105 01/01/20 1038       Subjective: Patient seen and evaluated today with persistent confusion noted.  He appears to be able to tolerate small parts of his diet but has some epigastric discomfort.  Blood glucose remains slightly labile, but trend is improving.  He did require D50 last night.  Objective: Vitals:   01/02/20 0400 01/02/20 0500 01/02/20 0600 01/02/20 0800  BP: (!) 158/50 (!) 155/43 (!) 161/38   Pulse: 78 79 80   Resp: 14 16 19    Temp: 98.5 F (36.9 C)   98.4 F (36.9 C)  TempSrc: Axillary   Oral  SpO2: 98% 100% 94%   Weight:      Height:        Intake/Output Summary (Last 24 hours) at 01/02/2020 1211 Last data filed at 01/02/2020 0500 Gross per 24 hour  Intake 240 ml  Output 3750 ml  Net -3510 ml   Filed Weights   12/31/19 1556 01/01/20 1725  Weight: (!) 108.2 kg 111.4 kg    Examination:  General exam: Appears calm and comfortable, ongoing confusion Respiratory system: Clear to auscultation. Respiratory effort normal. Cardiovascular system: S1 & S2 heard, RRR. Gastrointestinal system: Abdomen is distended, but nontender Central nervous  system: Alert and awake Extremities: No edema Skin: No rashes, lesions or ulcers Psychiatry: Flat affect    Data Reviewed: I have personally reviewed following labs and imaging studies  CBC: Recent Labs  Lab 12/31/19 1909 01/01/20 0424 01/02/20 0347  WBC 4.0 4.1 2.9*  NEUTROABS 3.3  --   --   HGB 8.3* 8.3* 7.7*  HCT 27.7* 27.2* 26.0*  MCV 96.5 96.1 97.0  PLT 123* 121* 161*   Basic Metabolic Panel: Recent Labs  Lab 12/31/19 1909 01/01/20 0424 01/02/20 0347  NA 123* 126* 130*  K 5.7* 5.7* 4.5  CL 93*  93* 95*  CO2 18* 22 24  GLUCOSE 102* 81 45*  BUN 44* 46* 25*  CREATININE 4.73* 4.81* 3.13*  CALCIUM 8.5* 8.6* 8.5*  MG  --  1.8  --   PHOS  --  7.2*  --    GFR: Estimated Creatinine Clearance: 25.4 mL/min (A) (by C-G formula based on SCr of 3.13 mg/dL (H)). Liver Function Tests: Recent Labs  Lab 12/31/19 1909 01/01/20 0424 01/02/20 0347  AST 29 26 31   ALT 25 24 26   ALKPHOS 174* 163* 165*  BILITOT 0.7 0.8 0.7  PROT 7.1 6.9 7.2  ALBUMIN 3.1* 3.0* 3.0*   Recent Labs  Lab 12/31/19 1909  LIPASE 23   Recent Labs  Lab 12/31/19 1909 01/01/20 0424 01/02/20 0347  AMMONIA 86* 66* 57*   Coagulation Profile: Recent Labs  Lab 01/01/20 0424  INR 1.2   Cardiac Enzymes: No results for input(s): CKTOTAL, CKMB, CKMBINDEX, TROPONINI in the last 168 hours. BNP (last 3 results) No results for input(s): PROBNP in the last 8760 hours. HbA1C: No results for input(s): HGBA1C in the last 72 hours. CBG: Recent Labs  Lab 01/02/20 0409 01/02/20 0506 01/02/20 0803 01/02/20 0834 01/02/20 1131  GLUCAP 40*  40* 83  83 40*  40* 93  93 81   Lipid Profile: No results for input(s): CHOL, HDL, LDLCALC, TRIG, CHOLHDL, LDLDIRECT in the last 72 hours. Thyroid Function Tests: No results for input(s): TSH, T4TOTAL, FREET4, T3FREE, THYROIDAB in the last 72 hours. Anemia Panel: No results for input(s): VITAMINB12, FOLATE, FERRITIN, TIBC, IRON, RETICCTPCT in the last 72 hours. Sepsis Labs: Recent Labs  Lab 01/02/20 0347  LATICACIDVEN 1.2    Recent Results (from the past 240 hour(s))  SARS Coronavirus 2 by RT PCR (hospital order, performed in [facility] hospital lab) Nasopharyngeal Nasopharyngeal Swab     Status: None   Collection Time: 12/31/19  8:59 PM   Specimen: Nasopharyngeal Swab  Result Value Ref Range Status   SARS Coronavirus 2 NEGATIVE NEGATIVE Final    Comment: (NOTE) SARS-CoV-2 target nucleic acids are NOT DETECTED.  The SARS-CoV-2 RNA is generally detectable in upper  and lower respiratory specimens during the acute phase of infection. The lowest concentration of SARS-CoV-2 viral copies this assay can detect is 250 copies / mL. A negative result does not preclude SARS-CoV-2 infection and should not be used as the sole basis for treatment or other patient management decisions.  A negative result may occur with improper specimen collection / handling, submission of specimen other than nasopharyngeal swab, presence of viral mutation(s) within the areas targeted by this assay, and inadequate number of viral copies (<250 copies / mL). A negative result must be combined with clinical observations, patient history, and epidemiological information.  Fact Sheet for Patients:   StrictlyIdeas.no  Fact Sheet for Healthcare Providers: BankingDealers.co.za  This test is not yet approved or  cleared by the [place] and has been authorized for detection and/or diagnosis of SARS-CoV-2 by FDA under an Emergency Use Authorization (EUA).  This EUA will remain in effect (meaning this test can be used) for the duration of the COVID-19 declaration under Section 564(b)(1) of the Act, 21 U.S.C. section 360bbb-3(b)(1), unless the authorization is terminated or revoked sooner.  Performed at [facility], [address], [place], [place] [address]   Gram stain     Status: None   Collection Time: 01/01/20 11:40 AM   Specimen: Fluid  Result Value Ref Range Status   Specimen Description FLUID LEFT THORACENTESIS COLLECTED BY DOCTOR  Final   Special Requests NONE  Final   Gram Stain   Final    CYTOSPIN SMEAR WBC PRESENT, PREDOMINANTLY MONONUCLEAR NO ORGANISMS SEEN Performed at [facility] Performed at [facility], [address] [place], [place] [address]    Report Status 01/01/2020 FINAL  Final  Culture, body fluid-bottle     Status: None (Preliminary result)   Collection Time: 01/01/20  1:20 PM   Specimen:  Ascitic  Result Value Ref Range Status   Specimen Description ASCITIC  Final   Special Requests BOTTLES DRAWN AEROBIC AND ANAEROBIC 10CC EACH  Final   Culture   Final    NO GROWTH < 24 HOURS Performed at [facility], [address], [place], [place] [address]    Report Status PENDING  Incomplete  Gram stain     Status: None   Collection Time: 01/01/20  1:20 PM   Specimen: Fluid  Result Value Ref Range Status   Specimen Description FLUID ASCITIC COLLECTED BY DOCTOR  Final   Special Requests NONE  Final   Gram Stain   Final    NO ORGANISMS SEEN WBC SEEN WBC PRESENT,BOTH PMN AND MONONUCLEAR CYTOSPIN SMEAR PERFORMED AT [facility] Performed at [facility], [address] [place], [place] [address]    Report Status 01/01/2020 FINAL  Final         Radiology Studies: CT ABDOMEN PELVIS W WO CONTRAST  Result Date: 01/01/2020 CLINICAL DATA:  Cirrhosis, abdominal pain,, 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 CONTRAST:  151mL OMNIPAQUE IOHEXOL 300 MG/ML  SOLN COMPARISON:  CT abdomen pelvis, 12/31/2019 FINDINGS: Lower chest: Unchanged moderate left, small right pleural effusions and associated atelectasis or consolidation. Coronary artery calcifications. Hepatobiliary: Cirrhotic morphology of the liver. There is unchanged, irregular subcapsular hypodensity of the anterior dome of the liver (series 12, image 119) without suspicious contrast enhancement or focal lesion, this appearance not changed compared to prior examinations dating back to 01/10/2016. Gallstones in the contracted gallbladder. No biliary ductal dilatation. Pancreas: Unremarkable. No pancreatic ductal dilatation or surrounding inflammatory changes. Spleen: Splenomegaly, maximum coronal  span 17.0 cm. Adrenals/Urinary Tract: Adrenal glands are unremarkable. Kidneys are normal, without renal calculi, solid lesion, or hydronephrosis. Bladder is decompressed by Foley catheter. Stomach/Bowel: Stomach is within normal limits. Appendix is not clearly visualized. No evidence of bowel wall thickening, distention, or inflammatory changes. Occasional sigmoid diverticula. Vascular/Lymphatic: Aortic atherosclerosis. There is some eccentric calcification of the central portions of the superior mesenteric vein near the confluence with the portal vein; there is no significant intraluminal thrombus (series 12, image 58). No enlarged abdominal or pelvic lymph nodes. Reproductive: No mass or other  significant abnormality. Other: No abdominal wall hernia or abnormality. Small volume ascites in the abdomen and pelvis, unchanged compared to prior examination. Musculoskeletal: No acute or significant osseous findings. IMPRESSION: 1. Cirrhotic morphology of the liver. There is unchanged, irregular subcapsular hypodensity of the anterior dome of the liver without suspicious contrast enhancement or focal lesion, this appearance not changed compared to prior examinations dating back to 01/10/2016 and perhaps related to scarring or fibrosis. 2. There is some eccentric calcification of the central portions of the superior mesenteric vein near the confluence with the portal vein; there is no significant intraluminal thrombus. 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 3. Splenomegaly. 4. Cholelithiasis. 5. Small volume ascites in the abdomen and pelvis, unchanged compared to prior examination. 6. Unchanged moderate left, small right pleural effusions and associated atelectasis or consolidation. 7. Coronary artery disease.  Aortic Atherosclerosis (ICD10-I70.0). Electronically Signed   By: Eddie Candle M.D.   On: 01/01/2020 11:22   CT ABDOMEN PELVIS WO CONTRAST  Result Date:  12/31/2019 CLINICAL DATA:  Abdominal distension, abdominal pain for 2 weeks. EXAM: CT ABDOMEN AND PELVIS WITHOUT CONTRAST TECHNIQUE: Multidetector CT imaging of the abdomen and pelvis was performed following the standard protocol without IV contrast. COMPARISON:  January 10, 2016 FINDINGS: Lower chest: Moderate LEFT and small RIGHT pleural effusion with basilar airspace disease also greatest on the LEFT. Central venous access device terminates at the caval to atrial junction incompletely imaged. No pericardial fluid. Hepatobiliary: Heterogeneity of the RIGHT hepatic lobe showing linear and branching appearance grossly similar to the study of January 10, 2016. Frankly cirrhotic liver. Cholelithiasis. Gallbladder is collapsed. There is generalized stranding throughout the abdomen nonspecific to the gallbladder. Focal low attenuation in the RIGHT hemi liver on image 15 of series 2 2.5 x 3.4 cm. Pancreas: Pancreas is normal without ductal dilation, gross inflammation or contour change from previous imaging study. Spleen: Spleen remains enlarged. Signs of portosystemic collaterals in the upper abdomen. This includes splenorenal shunting. Adrenals/Urinary Tract: Adrenal glands grossly normal though LEFT adrenal is partially obscured by retroperitoneal venous collaterals. No hydronephrosis. No perinephric stranding. Urinary bladder collapsed with Foley catheter in situ. No nephrolithiasis. No ureteral calculi. Stomach/Bowel: Stool throughout the colon. Generalized edema in the abdominal mesenteries. Small volume ascites mainly in the LEFT hemiabdomen. No perienteric or pericolonic stranding. Signs of colonic diverticulosis, mild. Vascular/Lymphatic: Calcified atheromatous plaque in the abdominal aorta. No aneurysmal dilation. No adenopathy in the retroperitoneum. No adenopathy in the pelvis. 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  Reproductive: Prostate grossly normal by CT. Other: Body wall edema and skin thickening over a lower abdominal pannus. Skin thickening was present previously. Interval abdominal wall reconstruction and repair of umbilical hernia since the prior study. Generalized body wall edema. Musculoskeletal: No acute musculoskeletal process. Spinal degenerative changes. IMPRESSION: 1. Moderate LEFT and small RIGHT pleural effusion with basilar airspace disease also greatest on the LEFT. Airspace disease likely volume loss associated with effusion. 2. Calcification of the periphery of the SMV and portal venous system can be seen in the setting of chronic thrombosis. CT or multiphase MRI may be helpful as clinically warranted. 3. Cirrhotic liver with signs of portal hypertension. Generalized edema could be also related to liver disease. Focal area of low attenuation in the RIGHT hemi liver may represent confluent fibrosis the given severity of cirrhotic changes however, would suggest follow-up with multiphase CT or MRI as dictated by  the patient's renal function. 4. Small volume ascites mainly in the LEFT hemiabdomen. The location of the ascites raising the question of some loculation. No peripheral rind is suggested on noncontrast imaging. Correlate with any clinical symptoms of or history of SBP. 5. Cholelithiasis with collapsed gallbladder and with generalized stranding in the abdomen in the setting of portal hypertension. 6. Interval abdominal wall reconstruction and repair of umbilical hernia since the prior study. 7. Body wall edema and skin thickening over a lower abdominal pannus. Skin thickening was present previously. Correlate with any clinical evidence of cellulitis. 8. Aortic atherosclerosis. These results were called by telephone at the time of interpretation on 12/31/2019 at 7:44 pm to provider JULIE IDOL , who verbally acknowledged these results. Aortic Atherosclerosis (ICD10-I70.0). Electronically Signed   By:  Zetta Bills M.D.   On: 12/31/2019 19:38   DG Chest 1 View  Result Date: 01/01/2020 CLINICAL DATA:  LEFT pleural effusion post thoracentesis EXAM: CHEST  1 VIEW COMPARISON:  Portable exam 1138 hours compared to 09/18/2019 FINDINGS: Dual lumen RIGHT jugular catheter with tip projecting over cavoatrial junction. Enlargement of cardiac silhouette with pulmonary vascular congestion. Bibasilar atelectasis. Lungs otherwise clear. No gross pleural effusion or pneumothorax. IMPRESSION: No pneumothorax following LEFT thoracentesis. Electronically Signed   By: Lavonia Dana M.D.   On: 01/01/2020 13:51   US Paracentesis  Result Date: 01/01/2020 INDICATION: Alcoholic cirrhosis, end-stage renal disease on dialysis, ascites, question spontaneous bacterial peritonitis EXAM: ULTRASOUND GUIDED DIAGNOSTIC PARACENTESIS MEDICATIONS: None COMPLICATIONS: None immediate PROCEDURE: Informed written consent was obtained from the patient after a discussion of the risks, benefits and alternatives to treatment. A timeout was performed prior to the initiation of the procedure. Initial ultrasound scanning demonstrates a large amount of ascites within the LEFT lower abdominal quadrant. The right lower abdomen was prepped and draped in the usual sterile fashion. 1% lidocaine was used for local anesthesia. Following this, a 5 [place] Yueh catheter was introduced. An ultrasound image was saved for documentation purposes. The paracentesis was performed. The catheter was removed and a dressing was applied. The patient tolerated the procedure well without immediate post procedural complication. FINDINGS: A total of approximately 240 mL of yellow ascitic fluid was removed. Samples were sent to the laboratory as requested by the clinical team. IMPRESSION: Successful ultrasound-guided paracentesis yielding 240 mL of peritoneal fluid. Electronically Signed   By: Lavonia Dana M.D.   On: 01/01/2020 14:13   ECHOCARDIOGRAM COMPLETE  Result Date:  01/01/2020    ECHOCARDIOGRAM REPORT   Patient Name:   JAVELLE DONIGAN Date of Exam: 01/01/2020 Medical Rec #:  [id number]    Height:       67.0 in Accession #:    [id number]   Weight:       238.5 lb Date of Birth:  Jun 09, 1945     BSA:          2.180 m Patient Age:    57 years     BP:           163/70 mmHg Patient Gender: M            HR:           89 bpm. Exam Location:  [name] Procedure: 2D Echo Indications:    Congestive Heart Failure 428.0 / I50.9  History:        Patient has no prior history of Echocardiogram examinations.                 Risk  Factors:Diabetes and Former Smoker. Alcoholic cirrhosis ,                 ESRD.  Sonographer:    Leavy Cella RDCS (AE) Referring Phys: [phone] Royanne Foots [place]  1. Left ventricular ejection fraction, by estimation, is 70 to 75%. The left ventricle has normal function. The left ventricle has no regional wall motion abnormalities. Left ventricular diastolic parameters were normal.  2. Right ventricular systolic function was not well visualized. The right ventricular size is not well visualized.  3. Left atrial size was mildly dilated.  4. The mitral valve is normal in structure. No evidence of mitral valve regurgitation. No evidence of mitral stenosis.  5. The aortic valve has an indeterminant number of cusps. Aortic valve regurgitation is not visualized. Mild aortic valve stenosis.  6. The inferior vena cava is normal in size with greater than 50% respiratory variability, suggesting right atrial pressure of 3 mmHg. FINDINGS  Left Ventricle: Left ventricular ejection fraction, by estimation, is 70 to 75%. The left ventricle has normal function. The left ventricle has no regional wall motion abnormalities. The left ventricular internal cavity size was normal in size. There is  no left ventricular hypertrophy. Left ventricular diastolic parameters were normal. Right Ventricle: RV not well visualized, grossly appears normal in size and function. The right ventricular  size is not well visualized. Right vetricular wall thickness was not assessed. Right ventricular systolic function was not well visualized. Left Atrium: Left atrial size was mildly dilated. Right Atrium: Right atrial size was normal in size. Pericardium: There is no evidence of pericardial effusion. Mitral Valve: The mitral valve is normal in structure. No evidence of mitral valve regurgitation. No evidence of mitral valve stenosis. Tricuspid Valve: The tricuspid valve is normal in structure. Tricuspid valve regurgitation is not demonstrated. No evidence of tricuspid stenosis. Aortic Valve: The aortic valve has an indeterminant number of cusps. . There is mild thickening and mild calcification of the aortic valve. Aortic valve regurgitation is not visualized. Mild aortic stenosis is present. Mild aortic valve annular calcification. There is mild thickening of the aortic valve. There is mild calcification of the aortic valve. Aortic valve mean gradient measures 9.3 mmHg. Aortic valve peak gradient measures 16.8 mmHg. Aortic valve area, by VTI measures 1.72 cm. Pulmonic Valve: The pulmonic valve was not well visualized. Pulmonic valve regurgitation is not visualized. No evidence of pulmonic stenosis. Aorta: The aortic root is normal in size and structure. Pulmonary Artery: Indeterminant PASP, inadequate TR jet. Venous: The inferior vena cava is normal in size with greater than 50% respiratory variability, suggesting right atrial pressure of 3 mmHg. IAS/Shunts: No atrial level shunt detected by color flow Doppler.  LEFT VENTRICLE PLAX 2D LVIDd:         4.52 cm  Diastology LVIDs:         2.60 cm  LV e' lateral:   12.00 cm/s LV PW:         1.00 cm  LV E/e' lateral: 9.2 LV IVS:        1.01 cm  LV e' medial:    7.72 cm/s LVOT diam:     2.10 cm  LV E/e' medial:  14.2 LV SV:         75 LV SV Index:   35 LVOT Area:     3.46 cm  RIGHT VENTRICLE RV S prime:     13.30 cm/s TAPSE (M-mode): 2.1 cm LEFT ATRIUM  Index        RIGHT ATRIUM           Index LA diam:        4.70 cm 2.16 cm/m  RA Area:     23.30 cm LA Vol (A2C):   67.6 ml 31.02 ml/m RA Volume:   79.60 ml  36.52 ml/m LA Vol (A4C):   72.3 ml 33.17 ml/m LA Biplane Vol: 70.4 ml 32.30 ml/m  AORTIC VALVE AV Area (Vmax):    1.69 cm AV Area (Vmean):   1.63 cm AV Area (VTI):     1.72 cm AV Vmax:           204.67 cm/s AV Vmean:          138.333 cm/s AV VTI:            0.438 m AV Peak Grad:      16.8 mmHg AV Mean Grad:      9.3 mmHg LVOT Vmax:         99.70 cm/s LVOT Vmean:        65.033 cm/s LVOT VTI:          0.217 m LVOT/AV VTI ratio: 0.50  AORTA Ao Root diam: 3.20 cm MITRAL VALVE MV Area (PHT): 3.56 cm     SHUNTS MV Decel Time: 213 msec     Systemic VTI:  0.22 m MV E velocity: 110.00 cm/s  Systemic Diam: 2.10 cm MV A velocity: 54.80 cm/s MV E/A ratio:  2.01 Carlyle Dolly MD Electronically signed by Carlyle Dolly MD Signature Date/Time: 01/01/2020/6:48:57 PM    Final    US THORACENTESIS ASP PLEURAL SPACE W/IMG GUIDE  Result Date: 01/01/2020 INDICATION: Pleural effusion, history alcoholic cirrhosis, atrial fibrillation, end-stage renal disease on dialysis, diabetes mellitus, hypertension EXAM: ULTRASOUND GUIDED DIAGNOSTIC LEFT THORACENTESIS MEDICATIONS: None. COMPLICATIONS: None immediate. PROCEDURE: An ultrasound guided thoracentesis was thoroughly discussed with the patient and questions answered. The benefits, risks, alternatives and complications were also discussed. The patient understands and wishes to proceed with the procedure. Written consent was obtained. Ultrasound was performed to localize and mark an adequate pocket of fluid in the LEFT chest. The area was then prepped and draped in the normal sterile fashion. 1% Lidocaine was used for local anesthesia. Under ultrasound guidance a 5 [place] Yueh catheter was introduced. Thoracentesis was performed. The catheter was removed and a dressing applied. FINDINGS: A total of approximately 240 mL of dark yellow  LEFT pleural fluid was removed. Samples were sent to the laboratory as requested by the clinical team. IMPRESSION: Successful ultrasound guided diagnostic LEFT thoracentesis yielding 240 mL of pleural fluid. Electronically Signed   By: Lavonia Dana M.D.   On: 01/01/2020 13:50        Scheduled Meds: . furosemide  40 mg Oral BID  . insulin aspart  0-9 Units Subcutaneous Q4H  . iron polysaccharides  150 mg Oral QHS  . lactulose  30 g Oral TID  . metoprolol succinate  50 mg Oral BID  . pantoprazole  40 mg Oral QAC breakfast  . pravastatin  20 mg Oral QPM  . rifaximin  550 mg Oral BID  . spironolactone  50 mg Oral q AM  . tamsulosin  0.4 mg Oral QPM  . thiamine  100 mg Oral q AM  . venlafaxine  75 mg Oral TID WC  . vitamin B-12  1,000 mcg Oral Daily    LOS: 2 days    Time spent: 35 minutes  Tyona Nilsen Darleen Crocker, DO [facility]  If 7PM-7AM, please contact night-coverage [email or web address] 01/02/2020, 12:11 PM

## 2020-01-02 NOTE — Progress Notes (Addendum)
Subjective: States he doesn't feel well. Denies any abdominal pain or other pain. Can't identify any specific thing that is bothering him; states he just feels blah. Ate about 50% of his breakfast without trouble. Denies nausea or vomiting. Denies confusion but seems he is still having some difficulty thinking. States he doesn't take lactulose at home on a daily basis.   Able to tell me his name, birth date, current month, and location. Unable to tell me the year, president, or why he came to the hospital.   3 BMs yesterday. None this morning, but he hasn't received Lactulose as of yet.   Objective: Vital signs in last 24 hours: Temp:  [97.9 F (36.6 C)-98.5 F (36.9 C)] 98.4 F (36.9 C) (08/03 0800) Pulse Rate:  [77-91] 80 (08/03 0600) Resp:  [11-20] 19 (08/03 0600) BP: (117-196)/(33-120) 161/38 (08/03 0600) SpO2:  [93 %-100 %] 94 % (08/03 0600) Weight:  [111.4 kg] 111.4 kg (08/02 1725) Last BM Date: 01/01/20 General:   Alert, pleasant, no acute distress Head:  Normocephalic and atraumatic. Eyes:  No icterus, sclera clear. Conjuctiva pink.   Abdomen:  Bowel sounds present, soft, non-distended. Mild tenderness to palpation in the upper abdomen. Patient reports this is chronic and unchanged. No HSM or hernias noted. No rebound or guarding. No masses appreciated  Msk:  Symmetrical without gross deformities. Normal posture. Pulses:  Normal pulses noted. Extremities:  Without clubbing or edema. Neurologic:  Alert and  oriented x4;  grossly normal neurologically. Skin:  Warm and dry, intact without significant lesions.  Cervical Nodes:  No significant cervical adenopathy. Psych:  Alert and cooperative. Normal mood and affect.  Intake/Output from previous day: 08/02 0701 - 08/03 0700 In: 240 [P.O.:240] Out: 3750 [Urine:750] Intake/Output this shift: No intake/output data recorded.  Lab Results: Recent Labs    12/31/19 1909 01/01/20 0424 01/02/20 0347  WBC 4.0 4.1 2.9*   HGB 8.3* 8.3* 7.7*  HCT 27.7* 27.2* 26.0*  PLT 123* 121* 116*   BMET Recent Labs    12/31/19 1909 01/01/20 0424 01/02/20 0347  NA 123* 126* 130*  K 5.7* 5.7* 4.5  CL 93* 93* 95*  CO2 18* 22 24  GLUCOSE 102* 81 45*  BUN 44* 46* 25*  CREATININE 4.73* 4.81* 3.13*  CALCIUM 8.5* 8.6* 8.5*   LFT Recent Labs    12/31/19 1909 01/01/20 0424 01/02/20 0347  PROT 7.1 6.9 7.2  ALBUMIN 3.1* 3.0* 3.0*  AST 29 26 31   ALT 25 24 26   ALKPHOS 174* 163* 165*  BILITOT 0.7 0.8 0.7   PT/INR Recent Labs    01/01/20 0424  LABPROT 14.6  INR 1.2    Studies/Results: CT ABDOMEN PELVIS W WO CONTRAST  Result Date: 01/01/2020 CLINICAL DATA:  Cirrhosis, abdominal pain,, 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 CONTRAST:  17mL OMNIPAQUE IOHEXOL 300 MG/ML  SOLN COMPARISON:  CT abdomen pelvis, 12/31/2019 FINDINGS: Lower chest: Unchanged moderate left, small right pleural effusions and associated atelectasis or consolidation. Coronary artery calcifications. Hepatobiliary: Cirrhotic morphology of the liver. There is unchanged, irregular subcapsular hypodensity of the anterior dome of the liver (series 12, image 119) without suspicious contrast enhancement or focal lesion, this appearance not changed compared to prior examinations dating back to 01/10/2016. Gallstones in the contracted gallbladder. No biliary  ductal dilatation. Pancreas: Unremarkable. No pancreatic ductal dilatation or surrounding inflammatory changes. Spleen: Splenomegaly, maximum coronal span 17.0 cm. Adrenals/Urinary Tract: Adrenal glands are unremarkable. Kidneys are normal, without renal calculi, solid lesion, or hydronephrosis. Bladder is decompressed by Foley catheter. Stomach/Bowel:  Stomach is within normal limits. Appendix is not clearly visualized. No evidence of bowel wall thickening, distention, or inflammatory changes. Occasional sigmoid diverticula. Vascular/Lymphatic: Aortic atherosclerosis. There is some eccentric calcification of the central portions of the superior mesenteric vein near the confluence with the portal vein; there is no significant intraluminal thrombus (series 12, image 58). No enlarged abdominal or pelvic lymph nodes. Reproductive: No mass or other significant abnormality. Other: No abdominal wall hernia or abnormality. Small volume ascites in the abdomen and pelvis, unchanged compared to prior examination. Musculoskeletal: No acute or significant osseous findings. IMPRESSION: 1. Cirrhotic morphology of the liver. There is unchanged, irregular subcapsular hypodensity of the anterior dome of the liver without suspicious contrast enhancement or focal lesion, this appearance not changed compared to prior examinations dating back to 01/10/2016 and perhaps related to scarring or fibrosis. 2. There is some eccentric calcification of the central portions of the superior mesenteric vein near the confluence with the portal vein; there is no significant intraluminal thrombus. 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 3. Splenomegaly. 4. Cholelithiasis. 5. Small volume ascites in the abdomen and pelvis, unchanged compared to prior examination. 6. Unchanged moderate left, small right pleural effusions and associated atelectasis or consolidation. 7. Coronary artery disease.  Aortic Atherosclerosis (ICD10-I70.0). Electronically Signed   By: Eddie Candle M.D.   On: 01/01/2020 11:22   CT ABDOMEN PELVIS WO CONTRAST  Result Date: 12/31/2019 CLINICAL DATA:  Abdominal distension, abdominal pain for 2 weeks. EXAM: CT ABDOMEN AND PELVIS WITHOUT CONTRAST TECHNIQUE: Multidetector CT imaging of the abdomen and pelvis was performed following the  standard protocol without IV contrast. COMPARISON:  January 10, 2016 FINDINGS: Lower chest: Moderate LEFT and small RIGHT pleural effusion with basilar airspace disease also greatest on the LEFT. Central venous access device terminates at the caval to atrial junction incompletely imaged. No pericardial fluid. Hepatobiliary: Heterogeneity of the RIGHT hepatic lobe showing linear and branching appearance grossly similar to the study of January 10, 2016. Frankly cirrhotic liver. Cholelithiasis. Gallbladder is collapsed. There is generalized stranding throughout the abdomen nonspecific to the gallbladder. Focal low attenuation in the RIGHT hemi liver on image 15 of series 2 2.5 x 3.4 cm. Pancreas: Pancreas is normal without ductal dilation, gross inflammation or contour change from previous imaging study. Spleen: Spleen remains enlarged. Signs of portosystemic collaterals in the upper abdomen. This includes splenorenal shunting. Adrenals/Urinary Tract: Adrenal glands grossly normal though LEFT adrenal is partially obscured by retroperitoneal venous collaterals. No hydronephrosis. No perinephric stranding. Urinary bladder collapsed with Foley catheter in situ. No nephrolithiasis. No ureteral calculi. Stomach/Bowel: Stool throughout the colon. Generalized edema in the abdominal mesenteries. Small volume ascites mainly in the LEFT hemiabdomen. No perienteric or pericolonic stranding. Signs of colonic diverticulosis, mild. Vascular/Lymphatic: Calcified atheromatous plaque in the abdominal aorta. No aneurysmal dilation. No adenopathy in the retroperitoneum. No adenopathy in the pelvis. 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 Reproductive: Prostate grossly normal by CT. Other: Body wall edema and skin thickening over a lower abdominal pannus. Skin thickening was present previously. Interval abdominal wall reconstruction and repair of  umbilical hernia since the prior study. Generalized body wall edema. Musculoskeletal: No  acute musculoskeletal process. Spinal degenerative changes. IMPRESSION: 1. Moderate LEFT and small RIGHT pleural effusion with basilar airspace disease also greatest on the LEFT. Airspace disease likely volume loss associated with effusion. 2. Calcification of the periphery of the SMV and portal venous system can be seen in the setting of chronic thrombosis. CT or multiphase MRI may be helpful as clinically warranted. 3. Cirrhotic liver with signs of portal hypertension. Generalized edema could be also related to liver disease. Focal area of low attenuation in the RIGHT hemi liver may represent confluent fibrosis the given severity of cirrhotic changes however, would suggest follow-up with multiphase CT or MRI as dictated by the patient's renal function. 4. Small volume ascites mainly in the LEFT hemiabdomen. The location of the ascites raising the question of some loculation. No peripheral rind is suggested on noncontrast imaging. Correlate with any clinical symptoms of or history of SBP. 5. Cholelithiasis with collapsed gallbladder and with generalized stranding in the abdomen in the setting of portal hypertension. 6. Interval abdominal wall reconstruction and repair of umbilical hernia since the prior study. 7. Body wall edema and skin thickening over a lower abdominal pannus. Skin thickening was present previously. Correlate with any clinical evidence of cellulitis. 8. Aortic atherosclerosis. These results were called by telephone at the time of interpretation on 12/31/2019 at 7:44 pm to provider JULIE IDOL , who verbally acknowledged these results. Aortic Atherosclerosis (ICD10-I70.0). Electronically Signed   By: Zetta Bills M.D.   On: 12/31/2019 19:38   DG Chest 1 View  Result Date: 01/01/2020 CLINICAL DATA:  LEFT pleural effusion post thoracentesis EXAM: CHEST  1 VIEW COMPARISON:  Portable exam 1138 hours compared to  09/18/2019 FINDINGS: Dual lumen RIGHT jugular catheter with tip projecting over cavoatrial junction. Enlargement of cardiac silhouette with pulmonary vascular congestion. Bibasilar atelectasis. Lungs otherwise clear. No gross pleural effusion or pneumothorax. IMPRESSION: No pneumothorax following LEFT thoracentesis. Electronically Signed   By: Lavonia Dana M.D.   On: 01/01/2020 13:51   US Paracentesis  Result Date: 01/01/2020 INDICATION: Alcoholic cirrhosis, end-stage renal disease on dialysis, ascites, question spontaneous bacterial peritonitis EXAM: ULTRASOUND GUIDED DIAGNOSTIC PARACENTESIS MEDICATIONS: None COMPLICATIONS: None immediate PROCEDURE: Informed written consent was obtained from the patient after a discussion of the risks, benefits and alternatives to treatment. A timeout was performed prior to the initiation of the procedure. Initial ultrasound scanning demonstrates a large amount of ascites within the LEFT lower abdominal quadrant. The right lower abdomen was prepped and draped in the usual sterile fashion. 1% lidocaine was used for local anesthesia. Following this, a 5 [place] Yueh catheter was introduced. An ultrasound image was saved for documentation purposes. The paracentesis was performed. The catheter was removed and a dressing was applied. The patient tolerated the procedure well without immediate post procedural complication. FINDINGS: A total of approximately 240 mL of yellow ascitic fluid was removed. Samples were sent to the laboratory as requested by the clinical team. IMPRESSION: Successful ultrasound-guided paracentesis yielding 240 mL of peritoneal fluid. Electronically Signed   By: Lavonia Dana M.D.   On: 01/01/2020 14:13   ECHOCARDIOGRAM COMPLETE  Result Date: 01/01/2020    ECHOCARDIOGRAM REPORT   Patient Name:   Jeffrey [name] Date of Exam: 01/01/2020 Medical Rec #:  [id number]    Height:       67.0 in Accession #:    [id number]   Weight:       238.5 lb Date of Birth:  [birth year]      BSA:  2.180 m Patient Age:    10 years     BP:           163/70 mmHg Patient Gender: M            HR:           89 bpm. Exam Location:  [name] Procedure: 2D Echo Indications:    Congestive Heart Failure 428.0 / I50.9  History:        Patient has no prior history of Echocardiogram examinations.                 Risk Factors:Diabetes and Former Smoker. Alcoholic cirrhosis ,                 ESRD.  Sonographer:    Leavy Cella RDCS (AE) Referring Phys: [phone] Royanne Foots [place]  1. Left ventricular ejection fraction, by estimation, is 70 to 75%. The left ventricle has normal function. The left ventricle has no regional wall motion abnormalities. Left ventricular diastolic parameters were normal.  2. Right ventricular systolic function was not well visualized. The right ventricular size is not well visualized.  3. Left atrial size was mildly dilated.  4. The mitral valve is normal in structure. No evidence of mitral valve regurgitation. No evidence of mitral stenosis.  5. The aortic valve has an indeterminant number of cusps. Aortic valve regurgitation is not visualized. Mild aortic valve stenosis.  6. The inferior vena cava is normal in size with greater than 50% respiratory variability, suggesting right atrial pressure of 3 mmHg. FINDINGS  Left Ventricle: Left ventricular ejection fraction, by estimation, is 70 to 75%. The left ventricle has normal function. The left ventricle has no regional wall motion abnormalities. The left ventricular internal cavity size was normal in size. There is  no left ventricular hypertrophy. Left ventricular diastolic parameters were normal. Right Ventricle: RV not well visualized, grossly appears normal in size and function. The right ventricular size is not well visualized. Right vetricular wall thickness was not assessed. Right ventricular systolic function was not well visualized. Left Atrium: Left atrial size was mildly dilated. Right Atrium: Right atrial size  was normal in size. Pericardium: There is no evidence of pericardial effusion. Mitral Valve: The mitral valve is normal in structure. No evidence of mitral valve regurgitation. No evidence of mitral valve stenosis. Tricuspid Valve: The tricuspid valve is normal in structure. Tricuspid valve regurgitation is not demonstrated. No evidence of tricuspid stenosis. Aortic Valve: The aortic valve has an indeterminant number of cusps. . There is mild thickening and mild calcification of the aortic valve. Aortic valve regurgitation is not visualized. Mild aortic stenosis is present. Mild aortic valve annular calcification. There is mild thickening of the aortic valve. There is mild calcification of the aortic valve. Aortic valve mean gradient measures 9.3 mmHg. Aortic valve peak gradient measures 16.8 mmHg. Aortic valve area, by VTI measures 1.72 cm. Pulmonic Valve: The pulmonic valve was not well visualized. Pulmonic valve regurgitation is not visualized. No evidence of pulmonic stenosis. Aorta: The aortic root is normal in size and structure. Pulmonary Artery: Indeterminant PASP, inadequate TR jet. Venous: The inferior vena cava is normal in size with greater than 50% respiratory variability, suggesting right atrial pressure of 3 mmHg. IAS/Shunts: No atrial level shunt detected by color flow Doppler.  LEFT VENTRICLE PLAX 2D LVIDd:         4.52 cm  Diastology LVIDs:         2.60 cm  LV  e' lateral:   12.00 cm/s LV PW:         1.00 cm  LV E/e' lateral: 9.2 LV IVS:        1.01 cm  LV e' medial:    7.72 cm/s LVOT diam:     2.10 cm  LV E/e' medial:  14.2 LV SV:         75 LV SV Index:   35 LVOT Area:     3.46 cm  RIGHT VENTRICLE RV S prime:     13.30 cm/s TAPSE (M-mode): 2.1 cm LEFT ATRIUM             Index       RIGHT ATRIUM           Index LA diam:        4.70 cm 2.16 cm/m  RA Area:     23.30 cm LA Vol (A2C):   67.6 ml 31.02 ml/m RA Volume:   79.60 ml  36.52 ml/m LA Vol (A4C):   72.3 ml 33.17 ml/m LA Biplane Vol: 70.4  ml 32.30 ml/m  AORTIC VALVE AV Area (Vmax):    1.69 cm AV Area (Vmean):   1.63 cm AV Area (VTI):     1.72 cm AV Vmax:           204.67 cm/s AV Vmean:          138.333 cm/s AV VTI:            0.438 m AV Peak Grad:      16.8 mmHg AV Mean Grad:      9.3 mmHg LVOT Vmax:         99.70 cm/s LVOT Vmean:        65.033 cm/s LVOT VTI:          0.217 m LVOT/AV VTI ratio: 0.50  AORTA Ao Root diam: 3.20 cm MITRAL VALVE MV Area (PHT): 3.56 cm     SHUNTS MV Decel Time: 213 msec     Systemic VTI:  0.22 m MV E velocity: 110.00 cm/s  Systemic Diam: 2.10 cm MV A velocity: 54.80 cm/s MV E/A ratio:  2.01 Carlyle Dolly MD Electronically signed by Carlyle Dolly MD Signature Date/Time: 01/01/2020/6:48:57 PM    Final    US THORACENTESIS ASP PLEURAL SPACE W/IMG GUIDE  Result Date: 01/01/2020 INDICATION: Pleural effusion, history alcoholic cirrhosis, atrial fibrillation, end-stage renal disease on dialysis, diabetes mellitus, hypertension EXAM: ULTRASOUND GUIDED DIAGNOSTIC LEFT THORACENTESIS MEDICATIONS: None. COMPLICATIONS: None immediate. PROCEDURE: An ultrasound guided thoracentesis was thoroughly discussed with the patient and questions answered. The benefits, risks, alternatives and complications were also discussed. The patient understands and wishes to proceed with the procedure. Written consent was obtained. Ultrasound was performed to localize and mark an adequate pocket of fluid in the LEFT chest. The area was then prepped and draped in the normal sterile fashion. 1% Lidocaine was used for local anesthesia. Under ultrasound guidance a 5 [place] Yueh catheter was introduced. Thoracentesis was performed. The catheter was removed and a dressing applied. FINDINGS: A total of approximately 240 mL of dark yellow LEFT pleural fluid was removed. Samples were sent to the laboratory as requested by the clinical team. IMPRESSION: Successful ultrasound guided diagnostic LEFT thoracentesis yielding 240 mL of pleural fluid.  Electronically Signed   By: Lavonia Dana M.D.   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 He underwent thoracentesis on 8/2 yielding 240 mL and diagnostic paracentesis on 8/2 yielding 240 mL with no SBP.  CT A/P with and without contrast 8/2 with cirrhotic liver morphology, unchanged irregular subcapsular hypodensity of the anterior dome without suspicious contrast-enhancement for focal lesion.  This is unchanged since August 2017 and suspected to be related to scarring or fibrosis.  Also with some eccentric calcification of the central portions of superior mesenteric vein with no significant intraluminal thrombus.  Suspected to reflect sequela of prior thrombus without significant residual.  Also with splenomegaly, cholelithiasis, small volume ascites, unchanged moderate left and small right pleural effusion.  Notably, patient reports upper abdominal pain is more of a chronic thing. May be secondary to gastritis. He is usually on PPI daily as an outpatient, but this has not been started this admission.   Encephalopathy: Improving with Lactulose and Xifaxan, but patient doesn't seem quite back to baseline. 3 BMs yesterday. None so far today, but he has not received Lactulose. Will continue lactulose at current dose as he is improving and having adequate number of daily BMs. Notably blood sugar was quite low this morning and may be contributing to mild alteration of mental status today.    Transfusion dependent anemia: Hgb remaining fairly  stable with no overt GI bleeding. Continue with outpatient Hematology follow-up. Chronically on PPI outpatient but not currently on PPI inpatient. Will go ahead and start this.    Agree with palliative consultation this admission.   Plan: Continue lactulose and Xifaxan Diuretics per Nephrology Transfuse as needed, continue outpatient follow-up with Hematology Restart PPI daily.  Agree with Palliative involvement.      LOS: 2 days    01/02/2020, 10:11 AM   [name], PA-C [facility]

## 2020-01-02 NOTE — Progress Notes (Signed)
Saw patient around 12:00pm. Seems he is continuing to have trouble getting thoughts together and answering questions. Tried asking about NSAID use at home prior to admission as possible contributing factor to upper abdominal pain/possible gastritis. Takes asking him several times before getting an answer due to patient forgetting what I have asked.   He did admit to 2 ibuprofen daily for headaches.   I will increase Lactulose to 30g TID for now. May decrease back to 20 g TID tomorrow if mental status improve.

## 2020-01-02 NOTE — Progress Notes (Signed)
Inpatient Diabetes Program Recommendations  AACE/ADA: New Consensus Statement on Inpatient Glycemic Control (2015)  Target Ranges:  Prepandial:   less than 140 mg/dL      Peak postprandial:   less than 180 mg/dL (1-2 hours)      Critically ill patients:  140 - 180 mg/dL   Lab Results  Component Value Date   GLUCAP 93 01/02/2020   HGBA1C 5.9 (H) 12/16/2019    Review of Glycemic Control Results for SHIV, SHUEY (MRN [id number]) as of 01/02/2020 10:49  Ref. Range 01/02/2020 04:09 01/02/2020 05:06 01/02/2020 05:06 01/02/2020 08:03 01/02/2020 08:34  Glucose-Capillary Latest Ref Range: 70 - 99 mg/dL 40 (LL) 83 83 40 (LL) 93   Diabetes history: Type 2 DM Outpatient Diabetes medications: Humalog PRN, Tresiba 120 units QHS Current orders for Inpatient glycemic control: Novolog 0-9 units Q4H  Inpatient Diabetes Program Recommendations:    Noted continued hypoglycemia and additional of renal diet. Patient was on D10% and appears to have been discontinued. If hypoglycemia persists, would recommend adding D5% back.   Thanks, Bronson Curb, MSN, RNC-OB Diabetes Coordinator [phone] (8a-5p)

## 2020-01-02 NOTE — Progress Notes (Signed)
Nephrology Follow-Up Consult note   Assessment/Recommendations: Jeffrey [name] is a/an 74 y.o. male with a past medical history significant for ESRD, ESLD, alcohol use disorder, DM 2 admitted for abdominal pain.       ESRD: MWF at [facility]. S/p HD on 8/2 tolerated well. Plan again for 8/4 Volume/HTN: s/p 3L UF with no issues yesterday.  Also thoracentesis and paracentesis.  Continues to be volume overloaded and hypertensive.  We will plan for volume removal with dialysis tomorrow.  Continue home Lasix, metoprolol, spironolactone ESLD/alcohol use disorder: Gastroenterology and palliative care following.  Appreciate assistance Hyponatremia: Related to free water retention.  Managing with dialysis. Failure to thrive: Primary team managing Diabetes mellitus type 2 uncontrolled with recent hypoglycemia: Management per primary team   Recommendations conveyed to primary service.    [facility] 01/02/2020 10:20 AM  ___________________________________________________________  CC: ESRD  Interval History/Subjective: Tolerated dialysis with 3 L of fluid removed.  Status post paracentesis and thoracentesis yesterday.  Patient states he feels slightly better but continues to have poor appetite.  Mild shortness of breath.  No other complaints   Medications:  Current Facility-Administered Medications  Medication Dose Route Frequency Provider Last Rate Last Admin  . furosemide (LASIX) tablet 40 mg  40 mg Oral BID Heath Lark D, DO   40 mg at 01/02/20 0809  . HYDROcodone-acetaminophen (NORCO/VICODIN) 5-325 MG per tablet 1 tablet  1 tablet Oral Q6H PRN Heath Lark D, DO   1 tablet at 01/01/20 2129  . insulin aspart (novoLOG) injection 0-9 Units  0-9 Units Subcutaneous Q4H Adefeso, Oladapo, DO      . iron polysaccharides (NIFEREX) capsule 150 mg  150 mg Oral QHS Shah, Pratik D, DO   150 mg at 01/01/20 2129  . labetalol (NORMODYNE) injection 10 mg  10 mg Intravenous  Q2H PRN Manuella Ghazi, Pratik D, DO      . lactulose (CHRONULAC) 10 GM/15ML solution 20 g  20 g Oral TID Adefeso, Oladapo, DO   20 g at 01/01/20 1616  . metoprolol succinate (TOPROL-XL) 24 hr tablet 50 mg  50 mg Oral BID Heath Lark D, DO   50 mg at 01/01/20 2129  . pravastatin (PRAVACHOL) tablet 20 mg  20 mg Oral QPM Shah, Pratik D, DO   20 mg at 01/01/20 1808  . rifaximin (XIFAXAN) tablet 550 mg  550 mg Oral BID Heath Lark D, DO   550 mg at 01/01/20 2129  . spironolactone (ALDACTONE) tablet 50 mg  50 mg Oral q AM Manuella Ghazi, Pratik D, DO   50 mg at 01/02/20 2778  . tamsulosin (FLOMAX) capsule 0.4 mg  0.4 mg Oral QPM Manuella Ghazi, Pratik D, DO   0.4 mg at 01/01/20 1808  . thiamine tablet 100 mg  100 mg Oral q AM Manuella Ghazi, Pratik D, DO   100 mg at 01/02/20 2423  . venlafaxine (EFFEXOR) tablet 75 mg  75 mg Oral TID WC Shah, Pratik D, DO   75 mg at 01/02/20 0809  . vitamin B-12 (CYANOCOBALAMIN) tablet 1,000 mcg  1,000 mcg Oral Daily Manuella Ghazi, Pratik D, DO   1,000 mcg at 01/01/20 1615      Review of Systems: 10 systems reviewed and negative except per interval history/subjective  Physical Exam: Vitals:   01/02/20 0600 01/02/20 0800  BP: (!) 161/38   Pulse: 80   Resp: 19   Temp:  98.4 F (36.9 C)  SpO2: 94%    No intake/output data recorded.  Intake/Output  Summary (Last 24 hours) at 01/02/2020 1020 Last data filed at 01/02/2020 0500 Gross per 24 hour  Intake 240 ml  Output 3750 ml  Net -3510 ml   Constitutional: Chronically ill-appearing, sitting in bed, no distress ENMT: ears and nose without scars or lesions, MMM CV: normal rate, 1+ pitting edema in the bilateral lower extremities Respiratory: Bilateral chest rise, normal work of breathing Gastrointestinal: soft, non-tender, no palpable masses or hernias Skin: no visible lesions or rashes Psych: alert, judgement/insight appropriate, appropriate mood and affect   Test Results I personally reviewed new and old clinical labs and radiology tests Lab Results   Component Value Date   NA 130 (L) 01/02/2020   K 4.5 01/02/2020   CL 95 (L) 01/02/2020   CO2 24 01/02/2020   BUN 25 (H) 01/02/2020   CREATININE 3.13 (H) 01/02/2020   CALCIUM 8.5 (L) 01/02/2020   ALBUMIN 3.0 (L) 01/02/2020   PHOS 7.2 (H) 01/01/2020

## 2020-01-03 DIAGNOSIS — Z66 Do not resuscitate: Secondary | ICD-10-CM

## 2020-01-03 LAB — COMPREHENSIVE METABOLIC PANEL
ALT: 24 U/L (ref 0–44)
AST: 27 U/L (ref 15–41)
Albumin: 3 g/dL — ABNORMAL LOW (ref 3.5–5.0)
Alkaline Phosphatase: 171 U/L — ABNORMAL HIGH (ref 38–126)
Anion gap: 14 (ref 5–15)
BUN: 34 mg/dL — ABNORMAL HIGH (ref 8–23)
CO2: 23 mmol/L (ref 22–32)
Calcium: 9 mg/dL (ref 8.9–10.3)
Chloride: 93 mmol/L — ABNORMAL LOW (ref 98–111)
Creatinine, Ser: 3.96 mg/dL — ABNORMAL HIGH (ref 0.61–1.24)
GFR calc Af Amer: 16 mL/min — ABNORMAL LOW (ref >60)
GFR calc non Af Amer: 14 mL/min — ABNORMAL LOW (ref >60)
Glucose, Bld: 89 mg/dL (ref 70–99)
Potassium: 4.5 mmol/L (ref 3.5–5.1)
Sodium: 130 mmol/L — ABNORMAL LOW (ref 135–145)
Total Bilirubin: 0.9 mg/dL (ref 0.3–1.2)
Total Protein: 7.3 g/dL (ref 6.5–8.1)

## 2020-01-03 LAB — GLUCOSE, CAPILLARY
Glucose-Capillary: 47 mg/dL — ABNORMAL LOW (ref 70–99)
Glucose-Capillary: 109 mg/dL — ABNORMAL HIGH (ref 70–99)
Glucose-Capillary: 40 mg/dL — CL (ref 70–99)
Glucose-Capillary: 44 mg/dL — CL (ref 70–99)
Glucose-Capillary: 31 mg/dL — CL (ref 70–99)
Glucose-Capillary: 26 mg/dL — CL (ref 70–99)
Glucose-Capillary: 125 mg/dL — ABNORMAL HIGH (ref 70–99)
Glucose-Capillary: 187 mg/dL — ABNORMAL HIGH (ref 70–99)

## 2020-01-03 LAB — MAGNESIUM: Magnesium: 1.7 mg/dL (ref 1.7–2.4)

## 2020-01-03 LAB — CBC
HCT: 25.6 % — ABNORMAL LOW (ref 39.0–52.0)
Hemoglobin: 7.8 g/dL — ABNORMAL LOW (ref 13.0–17.0)
MCH: 29.5 pg (ref 26.0–34.0)
MCHC: 30.5 g/dL (ref 30.0–36.0)
MCV: 97 fL (ref 80.0–100.0)
Platelets: 117 10*3/uL — ABNORMAL LOW (ref 150–400)
RBC: 2.64 MIL/uL — ABNORMAL LOW (ref 4.22–5.81)
RDW: 17 % — ABNORMAL HIGH (ref 11.5–15.5)
WBC: 4.1 10*3/uL (ref 4.0–10.5)
nRBC: 0 % (ref 0.0–0.2)

## 2020-01-03 LAB — AMMONIA: Ammonia: 21 umol/L (ref 9–35)

## 2020-01-03 MED ORDER — HEPARIN SODIUM (PORCINE) 1000 UNIT/ML IJ SOLN
3200.0000 [IU] | Freq: Once | INTRAMUSCULAR | Status: AC
  Administered 2020-01-03: 3200 [IU]

## 2020-01-03 MED ORDER — LACTULOSE 10 GM/15ML PO SOLN
30.0000 g | Freq: Four times a day (QID) | ORAL | Status: DC
  Administered 2020-01-03 – 2020-01-06 (×12): 30 g via ORAL
  Filled 2020-01-03 (×14): qty 60

## 2020-01-03 MED ORDER — HALOPERIDOL LACTATE 5 MG/ML IJ SOLN
0.5000 mg | Freq: Once | INTRAMUSCULAR | Status: AC
  Administered 2020-01-03: 0.5 mg via INTRAVENOUS
  Filled 2020-01-03: qty 1

## 2020-01-03 MED ORDER — NYSTATIN 100000 UNIT/GM EX POWD
Freq: Three times a day (TID) | CUTANEOUS | Status: DC
  Filled 2020-01-03 (×3): qty 15

## 2020-01-03 MED ORDER — DEXTROSE 50 % IV SOLN
INTRAVENOUS | Status: AC
  Filled 2020-01-03: qty 50

## 2020-01-03 MED ORDER — DEXTROSE 10 % IV SOLN: INTRAVENOUS | Status: DC

## 2020-01-03 MED ORDER — ACETAMINOPHEN 650 MG RE SUPP: 650.0000 mg | RECTAL | Status: DC | PRN

## 2020-01-03 MED ORDER — ACETAMINOPHEN 325 MG PO TABS
650.0000 mg | ORAL_TABLET | Freq: Four times a day (QID) | ORAL | Status: DC | PRN
  Administered 2020-01-03 – 2020-01-04 (×2): 650 mg via ORAL
  Filled 2020-01-03 (×2): qty 2

## 2020-01-03 MED ORDER — DEXTROSE 50 % IV SOLN
INTRAVENOUS | Status: AC
  Administered 2020-01-03: 50 mL
  Filled 2020-01-03: qty 50

## 2020-01-03 NOTE — Progress Notes (Signed)
Nephrology Follow-Up Consult note   Assessment/Recommendations: Jeffrey [name] is a/an 74 y.o. male with a past medical history significant for ESRD, ESLD, alcohol use disorder, DM 2 admitted for abdominal pain.       ESRD: MWF at [facility]. S/p HD on 8/2 tolerated well. Plan again for HD today  Volume/HTN: Continues to be volume overloaded and hypertensive.  We will plan for increase UF w/ HD today.  Continue home Lasix, metoprolol, spironolactone. Will consider increasing oral diuretics ESLD/alcohol use disorder: Gastroenterology and palliative care following.  Appreciate assistance.  Primary team increasing lactulose given confusion Hyponatremia: Related to free water retention.  Managing with dialysis. Failure to thrive/hypoglycemia: Primary team managing Diabetes mellitus type 2 uncontrolled with recent hypoglycemia: Management per primary team Abdominal pain: No signs of SBP on paracentesis   Recommendations conveyed to primary service.    [facility] 01/03/2020 10:36 AM  ___________________________________________________________  CC: ESRD  Interval History/Subjective: More confused today and not able to answer all questions.  States that he feels okay without shortness of breath or chest pain.  Also denies fevers or chills.   Medications:  Current Facility-Administered Medications  Medication Dose Route Frequency Provider Last Rate Last Admin  . furosemide (LASIX) tablet 40 mg  40 mg Oral BID Heath Lark D, DO   40 mg at 01/03/20 4650  . HYDROcodone-acetaminophen (NORCO/VICODIN) 5-325 MG per tablet 1 tablet  1 tablet Oral Q6H PRN Heath Lark D, DO   1 tablet at 01/03/20 0914  . insulin aspart (novoLOG) injection 0-9 Units  0-9 Units Subcutaneous Q4H Adefeso, Oladapo, DO   1 Units at 01/02/20 2036  . iron polysaccharides (NIFEREX) capsule 150 mg  150 mg Oral QHS Shah, Pratik D, DO   150 mg at 01/02/20 2204  . labetalol (NORMODYNE) injection  10 mg  10 mg Intravenous Q2H PRN Manuella Ghazi, Pratik D, DO      . lactulose (CHRONULAC) 10 GM/15ML solution 30 g  30 g Oral QID Annitta Needs, NP      . metoprolol succinate (TOPROL-XL) 24 hr tablet 50 mg  50 mg Oral BID Manuella Ghazi, Pratik D, DO   50 mg at 01/03/20 3546  . pantoprazole (PROTONIX) EC tablet 40 mg  40 mg Oral QAC breakfast Erenest Rasher, PA-C   40 mg at 01/03/20 5681  . pravastatin (PRAVACHOL) tablet 20 mg  20 mg Oral QPM Shah, Pratik D, DO   20 mg at 01/02/20 1810  . rifaximin (XIFAXAN) tablet 550 mg  550 mg Oral BID Heath Lark D, DO   550 mg at 01/03/20 2751  . spironolactone (ALDACTONE) tablet 50 mg  50 mg Oral q AM Manuella Ghazi, Pratik D, DO   50 mg at 01/03/20 0608  . tamsulosin (FLOMAX) capsule 0.4 mg  0.4 mg Oral QPM Shah, Pratik D, DO   0.4 mg at 01/02/20 1810  . thiamine tablet 100 mg  100 mg Oral q AM Manuella Ghazi, Pratik D, DO   100 mg at 01/03/20 0609  . venlafaxine (EFFEXOR) tablet 75 mg  75 mg Oral TID WC Shah, Pratik D, DO   75 mg at 01/03/20 0811  . vitamin B-12 (CYANOCOBALAMIN) tablet 1,000 mcg  1,000 mcg Oral Daily Heath Lark D, DO   1,000 mcg at 01/03/20 7001      Review of Systems: 10 systems reviewed and negative except per interval history/subjective  Physical Exam: Vitals:   01/03/20 1000 01/03/20 1015  BP: (!) 151/67 [name])  150/70  Pulse: 85   Resp:    Temp:    SpO2: 98%    Total I/O In: 120 [P.O.:120] Out: -   Intake/Output Summary (Last 24 hours) at 01/03/2020 1036 Last data filed at 01/03/2020 7341 Gross per 24 hour  Intake 720 ml  Output 826 ml  Net -106 ml   Constitutional: Chronically ill-appearing, sitting in bed, intermittently in distress with moaning ENMT: ears and nose without scars or lesions, MMM CV: normal rate, 2+ pitting edema in the bilateral lower extremities Respiratory: Bilateral chest rise, normal work of breathing Gastrointestinal: soft, non-tender, no palpable masses or hernias Skin: no visible lesions or rashes Psych: alert, poor insight  and judgment, appropriate mood   Test Results I personally reviewed new and old clinical labs and radiology tests Lab Results  Component Value Date   NA 130 (L) 01/03/2020   K 4.5 01/03/2020   CL 93 (L) 01/03/2020   CO2 23 01/03/2020   BUN 34 (H) 01/03/2020   CREATININE 3.96 (H) 01/03/2020   CALCIUM 9.0 01/03/2020   ALBUMIN 3.0 (L) 01/03/2020   PHOS 7.2 (H) 01/01/2020

## 2020-01-03 NOTE — Progress Notes (Addendum)
Subjective: Moaning in bed, stating his back hurts. Denies abdominal pain. Notes history of chronic back pain. Just received pain medication. Knows the city he is in. Difficult to keep focused on questions. Appears uncomfortable. Per nursing staff, last BM yesterday. Difficulty with hypoglycemic episodes. Events overnight reviewed. Patient had gotten himself to the bedside toilet and pulled foley out intact with bulb inflated. New foley placed.   Objective: Vital signs in last 24 hours: Temp:  [97.7 F (36.5 C)-98.7 F (37.1 C)] 98.2 F (36.8 C) (08/04 0727) Pulse Rate:  [74-93] 93 (08/04 0800) Resp:  [13-24] 24 (08/04 0800) BP: (129-181)/(38-78) 176/62 (08/04 0800) SpO2:  [88 %-100 %] 88 % (08/04 0800) Weight:  [102.9 kg] 102.9 kg (08/04 0500) Last BM Date: 01/02/20 General:   Sallow complexion, chronically ill-appearing, uncomfortable, alert and oriented to person and city but difficult to obtain consistent responses. Does not respond to questions consistently.  Abdomen:  Bowel sounds present, obese,  soft, non-tender, non-distended. Large midline incision well-healed. Urethra with small amount of blood, foley in place Extremities:  Without edema, chronic venous stasis changes.  Neurologic: unable to assess for asterixis as does not follow commands consistently   Intake/Output from previous day: 08/03 0701 - 08/04 0700 In: 960 [P.O.:960] Out: 826 [Urine:825; Stool:1] Intake/Output this shift: Total I/O In: 120 [P.O.:120] Out: -   Lab Results: Recent Labs    01/01/20 0424 01/02/20 0347 01/03/20 0540  WBC 4.1 2.9* 4.1  HGB 8.3* 7.7* 7.8*  HCT 27.2* 26.0* 25.6*  PLT 121* 116* 117*   BMET Recent Labs    01/01/20 0424 01/02/20 0347 01/03/20 0540  NA 126* 130* 130*  K 5.7* 4.5 4.5  CL 93* 95* 93*  CO2 22 24 23   GLUCOSE 81 45* 89  BUN 46* 25* 34*  CREATININE 4.81* 3.13* 3.96*  CALCIUM 8.6* 8.5* 9.0   LFT Recent Labs    01/01/20 0424 01/02/20 0347  01/03/20 0540  PROT 6.9 7.2 7.3  ALBUMIN 3.0* 3.0* 3.0*  AST 26 31 27   ALT 24 26 24   ALKPHOS 163* 165* 171*  BILITOT 0.8 0.7 0.9   PT/INR Recent Labs    01/01/20 0424  LABPROT 14.6  INR 1.2     Studies/Results: CT ABDOMEN PELVIS W WO CONTRAST  Result Date: 01/01/2020 CLINICAL DATA:  Cirrhosis, abdominal pain,, 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 CONTRAST:  18mL OMNIPAQUE IOHEXOL 300 MG/ML  SOLN COMPARISON:  CT abdomen pelvis, 12/31/2019 FINDINGS: Lower chest: Unchanged moderate left, small right pleural effusions and associated atelectasis or consolidation. Coronary artery calcifications. Hepatobiliary: Cirrhotic morphology of the liver. There is unchanged, irregular subcapsular hypodensity of the anterior dome of the liver (series 12, image 119) without suspicious contrast enhancement or focal lesion, this appearance not changed compared to prior examinations dating back to 01/10/2016. Gallstones in the contracted gallbladder. No biliary ductal dilatation. Pancreas: Unremarkable. No pancreatic ductal dilatation or surrounding inflammatory changes. Spleen: Splenomegaly, maximum coronal span 17.0 cm. Adrenals/Urinary Tract: Adrenal glands are unremarkable. Kidneys are normal, without renal calculi, solid lesion, or hydronephrosis. Bladder is decompressed by Foley catheter. Stomach/Bowel: Stomach is within normal limits. Appendix is not clearly visualized. No evidence of bowel wall thickening, distention, or inflammatory changes. Occasional sigmoid diverticula. Vascular/Lymphatic: Aortic atherosclerosis. There is some eccentric calcification  of the central portions of the superior mesenteric vein near the confluence with the portal vein; there  is no significant intraluminal thrombus (series 12, image 58). No enlarged abdominal or pelvic lymph nodes. Reproductive: No mass or other significant abnormality. Other: No abdominal wall hernia or abnormality. Small volume ascites in the abdomen and pelvis, unchanged compared to prior examination. Musculoskeletal: No acute or significant osseous findings. IMPRESSION: 1. Cirrhotic morphology of the liver. There is unchanged, irregular subcapsular hypodensity of the anterior dome of the liver without suspicious contrast enhancement or focal lesion, this appearance not changed compared to prior examinations dating back to 01/10/2016 and perhaps related to scarring or fibrosis. 2. There is some eccentric calcification of the central portions of the superior mesenteric vein near the confluence with the portal vein; there is no significant intraluminal thrombus. 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 3. Splenomegaly. 4. Cholelithiasis. 5. Small volume ascites in the abdomen and pelvis, unchanged compared to prior examination. 6. Unchanged moderate left, small right pleural effusions and associated atelectasis or consolidation. 7. Coronary artery disease.  Aortic Atherosclerosis (ICD10-I70.0). Electronically Signed   By: Eddie Candle M.D.   On: 01/01/2020 11:22   DG Chest 1 View  Result Date: 01/01/2020 CLINICAL DATA:  LEFT pleural effusion post thoracentesis EXAM: CHEST  1 VIEW COMPARISON:  Portable exam 1138 hours compared to 09/18/2019 FINDINGS: Dual lumen RIGHT jugular catheter with tip projecting over cavoatrial junction. Enlargement of cardiac silhouette with pulmonary vascular congestion. Bibasilar atelectasis. Lungs otherwise clear. No gross pleural effusion or pneumothorax. IMPRESSION: No pneumothorax following LEFT thoracentesis. Electronically Signed   By: Lavonia Dana M.D.   On: 01/01/2020 13:51   US Paracentesis  Result Date: 01/01/2020 INDICATION: Alcoholic  cirrhosis, end-stage renal disease on dialysis, ascites, question spontaneous bacterial peritonitis EXAM: ULTRASOUND GUIDED DIAGNOSTIC PARACENTESIS MEDICATIONS: None COMPLICATIONS: None immediate PROCEDURE: Informed written consent was obtained from the patient after a discussion of the risks, benefits and alternatives to treatment. A timeout was performed prior to the initiation of the procedure. Initial ultrasound scanning demonstrates a large amount of ascites within the LEFT lower abdominal quadrant. The right lower abdomen was prepped and draped in the usual sterile fashion. 1% lidocaine was used for local anesthesia. Following this, a 5 [place] Yueh catheter was introduced. An ultrasound image was saved for documentation purposes. The paracentesis was performed. The catheter was removed and a dressing was applied. The patient tolerated the procedure well without immediate post procedural complication. FINDINGS: A total of approximately 240 mL of yellow ascitic fluid was removed. Samples were sent to the laboratory as requested by the clinical team. IMPRESSION: Successful ultrasound-guided paracentesis yielding 240 mL of peritoneal fluid. Electronically Signed   By: Lavonia Dana M.D.   On: 01/01/2020 14:13   ECHOCARDIOGRAM COMPLETE  Result Date: 01/01/2020    ECHOCARDIOGRAM REPORT   Patient Name:   Jeffrey [name] Date of Exam: 01/01/2020 Medical Rec #:  [id number]    Height:       67.0 in Accession #:    [id number]   Weight:       238.5 lb Date of Birth:  [birth year]     BSA:          2.180 m Patient Age:    76 years     BP:           163/70 mmHg Patient Gender: M            HR:  89 bpm. Exam Location:  [name] Procedure: 2D Echo Indications:    Congestive Heart Failure 428.0 / I50.9  History:        Patient has no prior history of Echocardiogram examinations.                 Risk Factors:Diabetes and Former Smoker. Alcoholic cirrhosis ,                 ESRD.  Sonographer:    Leavy Cella RDCS (AE)  Referring Phys: [phone] Royanne Foots [place]  1. Left ventricular ejection fraction, by estimation, is 70 to 75%. The left ventricle has normal function. The left ventricle has no regional wall motion abnormalities. Left ventricular diastolic parameters were normal.  2. Right ventricular systolic function was not well visualized. The right ventricular size is not well visualized.  3. Left atrial size was mildly dilated.  4. The mitral valve is normal in structure. No evidence of mitral valve regurgitation. No evidence of mitral stenosis.  5. The aortic valve has an indeterminant number of cusps. Aortic valve regurgitation is not visualized. Mild aortic valve stenosis.  6. The inferior vena cava is normal in size with greater than 50% respiratory variability, suggesting right atrial pressure of 3 mmHg. FINDINGS  Left Ventricle: Left ventricular ejection fraction, by estimation, is 70 to 75%. The left ventricle has normal function. The left ventricle has no regional wall motion abnormalities. The left ventricular internal cavity size was normal in size. There is  no left ventricular hypertrophy. Left ventricular diastolic parameters were normal. Right Ventricle: RV not well visualized, grossly appears normal in size and function. The right ventricular size is not well visualized. Right vetricular wall thickness was not assessed. Right ventricular systolic function was not well visualized. Left Atrium: Left atrial size was mildly dilated. Right Atrium: Right atrial size was normal in size. Pericardium: There is no evidence of pericardial effusion. Mitral Valve: The mitral valve is normal in structure. No evidence of mitral valve regurgitation. No evidence of mitral valve stenosis. Tricuspid Valve: The tricuspid valve is normal in structure. Tricuspid valve regurgitation is not demonstrated. No evidence of tricuspid stenosis. Aortic Valve: The aortic valve has an indeterminant number of cusps. . There is mild  thickening and mild calcification of the aortic valve. Aortic valve regurgitation is not visualized. Mild aortic stenosis is present. Mild aortic valve annular calcification. There is mild thickening of the aortic valve. There is mild calcification of the aortic valve. Aortic valve mean gradient measures 9.3 mmHg. Aortic valve peak gradient measures 16.8 mmHg. Aortic valve area, by VTI measures 1.72 cm. Pulmonic Valve: The pulmonic valve was not well visualized. Pulmonic valve regurgitation is not visualized. No evidence of pulmonic stenosis. Aorta: The aortic root is normal in size and structure. Pulmonary Artery: Indeterminant PASP, inadequate TR jet. Venous: The inferior vena cava is normal in size with greater than 50% respiratory variability, suggesting right atrial pressure of 3 mmHg. IAS/Shunts: No atrial level shunt detected by color flow Doppler.  LEFT VENTRICLE PLAX 2D LVIDd:         4.52 cm  Diastology LVIDs:         2.60 cm  LV e' lateral:   12.00 cm/s LV PW:         1.00 cm  LV E/e' lateral: 9.2 LV IVS:        1.01 cm  LV e' medial:    7.72 cm/s LVOT diam:     2.10  cm  LV E/e' medial:  14.2 LV SV:         75 LV SV Index:   35 LVOT Area:     3.46 cm  RIGHT VENTRICLE RV S prime:     13.30 cm/s TAPSE (M-mode): 2.1 cm LEFT ATRIUM             Index       RIGHT ATRIUM           Index LA diam:        4.70 cm 2.16 cm/m  RA Area:     23.30 cm LA Vol (A2C):   67.6 ml 31.02 ml/m RA Volume:   79.60 ml  36.52 ml/m LA Vol (A4C):   72.3 ml 33.17 ml/m LA Biplane Vol: 70.4 ml 32.30 ml/m  AORTIC VALVE AV Area (Vmax):    1.69 cm AV Area (Vmean):   1.63 cm AV Area (VTI):     1.72 cm AV Vmax:           204.67 cm/s AV Vmean:          138.333 cm/s AV VTI:            0.438 m AV Peak Grad:      16.8 mmHg AV Mean Grad:      9.3 mmHg LVOT Vmax:         99.70 cm/s LVOT Vmean:        65.033 cm/s LVOT VTI:          0.217 m LVOT/AV VTI ratio: 0.50  AORTA Ao Root diam: 3.20 cm MITRAL VALVE MV Area (PHT): 3.56 cm     SHUNTS  MV Decel Time: 213 msec     Systemic VTI:  0.22 m MV E velocity: 110.00 cm/s  Systemic Diam: 2.10 cm MV A velocity: 54.80 cm/s MV E/A ratio:  2.01 Carlyle Dolly MD Electronically signed by Carlyle Dolly MD Signature Date/Time: 01/01/2020/6:48:57 PM    Final    US THORACENTESIS ASP PLEURAL SPACE W/IMG GUIDE  Result Date: 01/01/2020 INDICATION: Pleural effusion, history alcoholic cirrhosis, atrial fibrillation, end-stage renal disease on dialysis, diabetes mellitus, hypertension EXAM: ULTRASOUND GUIDED DIAGNOSTIC LEFT THORACENTESIS MEDICATIONS: None. COMPLICATIONS: None immediate. PROCEDURE: An ultrasound guided thoracentesis was thoroughly discussed with the patient and questions answered. The benefits, risks, alternatives and complications were also discussed. The patient understands and wishes to proceed with the procedure. Written consent was obtained. Ultrasound was performed to localize and mark an adequate pocket of fluid in the LEFT chest. The area was then prepped and draped in the normal sterile fashion. 1% Lidocaine was used for local anesthesia. Under ultrasound guidance a 5 [place] Yueh catheter was introduced. Thoracentesis was performed. The catheter was removed and a dressing applied. FINDINGS: A total of approximately 240 mL of dark yellow LEFT pleural fluid was removed. Samples were sent to the laboratory as requested by the clinical team. IMPRESSION: Successful ultrasound guided diagnostic LEFT thoracentesis yielding 240 mL of pleural fluid. Electronically Signed   By: Lavonia Dana M.D.   On: 01/01/2020 13:50    Assessment:  74 year old male with history of cirrhosis due to prior history of ETOH use (MELD Na 29, accounting for dialysis on admission) , ESRD on dialysis, transfusion dependent anemia s/p extensive evaluation (EGD X 2, colonoscopy, capsule endoscopy), SBP last year at outside hospital but unable to be on prophylaxis due to history of recurrent Cdiff, known varices but unable to  take non-selective beta blocker therapy, encephalopathy, presenting this  admission with abdominal pain and acute encephalopathy. Found to have pleural effusions, s/p thoracentesis. Negative cell count for SBP on diagnostic para on admission.   Encephalopathy: he has been non-compliant as outpatient with medications. Today with confusion and not back to his baseline. Only 1 stool yesterday per nursing staff. Ammonia level normalized this morning. Clinically, he is confused and needs titration of lactulose for goal of 3 BMs daily.   Anemia: chronic transfusion-dependent. No overt GI bleeding. Continue Protonix daily. Transfuse as needed.   ESRD: on dialysis. Diuretic management per Nephrology. Dialysis today.   Overall, patient has a guarded prognosis. Failure to thrive and limited options from GI standpoint other than supportive measures. Agree with palliative involvement and awaiting consultation.    Plan: Continue PPI daily Transfuse as needed  Check INR tomorrow Increase lactulose to goal of 3 BMs per day: monitor for diarrhea Continue Xifaxan BID Diuretic management per nephrology Dialysis today Awaiting palliative input: appreciate their involvement   Annitta Needs, PhD, ANP-BC [facility]    LOS: 3 days    01/03/2020, 9:20 AM

## 2020-01-03 NOTE — Progress Notes (Signed)
Pt arrived to room #316 via stretcher from ICU. Pt awake, responds to questions, denies c/o but is moaning and groaning. Drinking carton of glucerna at present. IVF infusing without s/s infiltration, VSS. Foley cath intact with clear dark yellow urine in bag.

## 2020-01-03 NOTE — Progress Notes (Signed)
Pt continuing to have hypoglycemic episodes. Managed to get patient to eat ice cream and drink some ginger ale at 2100. Usually patient experiences no confusion with hypoglycemic episodes, but this occurrence he had noticeable confusion. CBG 47. Administered amp of D50. Will recheck CBG and continue to monitor patient. Bed alarm is on.

## 2020-01-03 NOTE — Care Management Important Message (Signed)
Important Message  Patient Details  Name: Jeffrey [name] MRN: [id number] Date of Birth: Jun 03, 1945   Medicare Important Message Given:  Yes     [name] 01/03/2020, 4:09 PM

## 2020-01-03 NOTE — Progress Notes (Addendum)
PROGRESS NOTE    Jeffrey [name]  IOX:[id number] DOB: [birth year] DOA: 12/31/2019 PCP: Monico Blitz, MD   Brief Narrative:  Per HPI: Jeffrey [name] a 74 y.o.malewith medical history significant foralcoholic liver cirrhosis, ESRD(MWF), hypertension, diabetes mellitus type 2, hyperlipidemia, transfusion dependent anemia, portal hypertensive gastropathy, esophageal variceswho presents to the ED due to 2 week onset of intermittent right upper and lower abdominal pain which became worse in the past 4 days. Patient was unable to provide history possibly due to being lethargic (though easily arousable). He only kept repeating that the pain was 7/10 for all questions related to the abdominal pain. History was obtained from the ED physician and ED medical Record. Per report, Pain was sharp in nature and he denied increased abdominal swelling beyond his baseline. He was seen by his PCP (Dr Manuella Ghazi) 2 days ago who asked him to go to the ED for further evaluation but he was hoping that the pain will subside and today, pain worsened and wife noted that patient was more confused today, so he presented to the ED. There was no report of fever, chills, chest pain, SOB, N/V/D.Of note, patient was reported to have missed his dialysis on Friday, last dialysis was on Wednesday (12/27/2019).  Assessment & Plan:   Principal Problem:   Acute hepatic encephalopathy Active Problems:   Alcoholic cirrhosis of liver with ascites (HCC)   Type 2 diabetes mellitus (HCC)   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HCC)   Hyperkalemia   Hyponatremia   Pleural effusion   Hypoglycemia   Hyperammonemia (HCC)   Obesity (BMI 30-39.9)   Hypoalbuminemia   Hepatic encephalopathy (HCC)   Acute hepatic encephalopathy in the setting of hyperammonemia and hypoglycemia-persistent -Ammonia level 86>66>57>>21 after adjusted dose of lactulose -not compliant with home lactulose  -will Continue rifaximin -Appreciate GI recommendations and will  follow suggestions. -continue PPI -paracentesis on 01/01/20, no signs of SBP -poor prognosis, palliative care consulted.   Moderate left and right pleural effusion -CT abdomen pelvis without contrast showed moderate left and small right pleural effusion with basilar airspace disease greatest on the left. -Underwent left-sided thoracentesis on 8/2 -2D echocardiogram ordered 8/2 with LVEF 70-75% and no other acute findings. -breathing has improved some after thoracentesis and paracentesis; still with signs of fluid overload.   ESRD on HD (MWF) -Patient missed dialysis on Friday prior to admission. -Last hemodialysis 8/2 with repeat plans for HD today 8/4 -will follow renal service recommendations.  Hypoglycemia-intermittent in setting of type 2 diabetes -Patient presents with blood glucose in the high 20s, and IV D50 x1 was given. -has continue experiencing hypoglycemia in the setting of poor oral intake. -will restart D10 infusion -holding all hypoglycemic agents.  -close CBG monitoring  -will check cortisol level  Hyponatremia-improved -Na 130,possibly due to poor solute intake and missing hemodialysis. -Improved after hemodialysis 8/2 with plans for repeat hemodialysis later today 8/4; sodium is 130 currently.   Hyperkalemia -corrected with HD -continue to follow electrolytes trend.  History of alcoholic cirrhosis of liver with ascites -CT abdomen pelvis without contrast showed cirrhotic liver with signs of portal hypertension -No further alcohol use, confirmed with wife -Underwent paracentesis 8/2 with 240mL; fluid analysis no suggesting SBP. -continue lactulose and PPI.  Hypoalbuminemia -Alb 3.0,possibly related to patient's history of cirrhosis. -Consider protein supplement when patient is able to tolerate p.o. diet discussed  Transfusion dependent anemia -Currently downtrending Hgb -due to fluid overload signs, hemodilution effect playing a role -continue  Iron and epogen as  per renal discretion  -No overt bleeding noted -follow Hgb and transfuse as needed.   Class II obesity (Body mass index is 35.43 kg/m. ) -low calorie diet and portion controlled will be beneficial; unable to further discussed with patient due to encephalopathy.   DNR -long discussion with wife about her husband status and guarded condition; she understand how sick he is and expressed poor quality of life prior to acute decompensation. She has expressed that in the event of requiring invasive intervention or artificial support, she will like her husband to be kept comfortable and not to exposed him to that. -patient code status has been changed to DNR/DNI base on that discussion. -palliative care will continue to follow for advance directives.  -continue to treat the treatable.     DVT prophylaxis: SCDs Code Status: DNR/DNI Family Communication: no family at bedside, wife updated by phone. Disposition Plan:   Status is: Inpatient   Dispo: The patient is from: Home  Anticipated d/c is to: Home  Anticipated d/c date is: to be determined.   Patient currently is not medically stable to d/c. Continue Lactulose (dose adjusted by GI), will also follow nephrology rec's regarding oral diuretics and HD. Condition remains guarded and overall prognosis reserved. Will resume D10 at 50cc/her for ongoing hypoglycemia, but patient with fluid overload and difficult to add to much IVF's. Palliative care consultation pending. PO continue to be poor.   Consultants:   Nephrology  GI  Palliative care  Procedures:   Left-sided thoracentesis as well as paracentesis performed 8/2  Antimicrobials:  Anti-infectives (From admission, onward)   Start     Dose/Rate Route Frequency Ordered Stop   01/01/20 1100  rifaximin (XIFAXAN) tablet 550 mg     Discontinue     550 mg Oral 2 times daily 01/01/20 1038     01/01/20 0115  rifaximin (XIFAXAN)  tablet 200 mg  Status:  Discontinued        200 mg Oral 2 times daily 01/01/20 0105 01/01/20 1038      Subjective: Low grade temp this morning. No CP, no abd pain, no SOB. Continue to be mildly agitated, confused and listless. With ongoing hypoglycemia and signs of fluid overload.   Objective: Vitals:   01/03/20 1030 01/03/20 1100 01/03/20 1130 01/03/20 1145  BP: 135/79 (!) 149/64 108/64 (!) 103/54  Pulse:  97 100 100  Resp:      Temp:      TempSrc:      SpO2: 98% 100% 100% 100%  Weight:      Height:        Intake/Output Summary (Last 24 hours) at 01/03/2020 1156 Last data filed at 01/03/2020 0832 Gross per 24 hour  Intake 720 ml  Output 826 ml  Net -106 ml   Filed Weights   01/01/20 1725 01/03/20 0500 01/03/20 0950  Weight: 111.4 kg 102.9 kg 102.6 kg    Examination: General exam: low grade temp appreciated this morning. Patient continue to be listless and confused. Also with ongoing hypoglycemia and with signs of fluid overload.   Respiratory system: Clear to auscultation. Respiratory effort normal. Cardiovascular system:RRR. No murmurs, rubs, gallops.  Gastrointestinal system: Abdomen is distended, soft and nontender on palpation; positive fluid wave. Normal bowel sounds heard. Central nervous system: moving four limbs spontaneously, no focal deficits.  Extremities: No cyanosis or clubbing, positive 2++ edema bilaterally.  Skin: No petechiae.  Psychiatry: unable to follow commands, listless and confused. trying to get out of bed while receiving  HD, mildly agitated.    Data Reviewed: I have personally reviewed following labs and imaging studies  CBC: Recent Labs  Lab 12/31/19 1909 01/01/20 0424 01/02/20 0347 01/03/20 0540  WBC 4.0 4.1 2.9* 4.1  NEUTROABS 3.3  --   --   --   HGB 8.3* 8.3* 7.7* 7.8*  HCT 27.7* 27.2* 26.0* 25.6*  MCV 96.5 96.1 97.0 97.0  PLT 123* 121* 116* 664*   Basic Metabolic Panel: Recent Labs  Lab 12/31/19 1909 01/01/20 0424  01/02/20 0347 01/03/20 0540  NA 123* 126* 130* 130*  K 5.7* 5.7* 4.5 4.5  CL 93* 93* 95* 93*  CO2 18* 22 24 23   GLUCOSE 102* 81 45* 89  BUN 44* 46* 25* 34*  CREATININE 4.73* 4.81* 3.13* 3.96*  CALCIUM 8.5* 8.6* 8.5* 9.0  MG  --  1.8  --  1.7  PHOS  --  7.2*  --   --    GFR: Estimated Creatinine Clearance: 19.2 mL/min (A) (by C-G formula based on SCr of 3.96 mg/dL (H)).   Liver Function Tests: Recent Labs  Lab 12/31/19 1909 01/01/20 0424 01/02/20 0347 01/03/20 0540  AST 29 26 31 27   ALT 25 24 26 24   ALKPHOS 174* 163* 165* 171*  BILITOT 0.7 0.8 0.7 0.9  PROT 7.1 6.9 7.2 7.3  ALBUMIN 3.1* 3.0* 3.0* 3.0*   Recent Labs  Lab 12/31/19 1909  LIPASE 23   Recent Labs  Lab 12/31/19 1909 01/01/20 0424 01/02/20 0347 01/03/20 0540  AMMONIA 86* 66* 57* 21   Coagulation Profile: Recent Labs  Lab 01/01/20 0424  INR 1.2   CBG: Recent Labs  Lab 01/02/20 2353 01/03/20 0326 01/03/20 0437 01/03/20 0727 01/03/20 1119  GLUCAP 96 47* 109* 40* 44*   Sepsis Labs: Recent Labs  Lab 01/02/20 0347  LATICACIDVEN 1.2    Recent Results (from the past 240 hour(s))  SARS Coronavirus 2 by RT PCR (hospital order, performed in [facility] hospital lab) Nasopharyngeal Nasopharyngeal Swab     Status: None   Collection Time: 12/31/19  8:59 PM   Specimen: Nasopharyngeal Swab  Result Value Ref Range Status   SARS Coronavirus 2 NEGATIVE NEGATIVE Final    Comment: (NOTE) SARS-CoV-2 target nucleic acids are NOT DETECTED.  The SARS-CoV-2 RNA is generally detectable in upper and lower respiratory specimens during the acute phase of infection. The lowest concentration of SARS-CoV-2 viral copies this assay can detect is 250 copies / mL. A negative result does not preclude SARS-CoV-2 infection and should not be used as the sole basis for treatment or other patient management decisions.  A negative result may occur with improper specimen collection / handling, submission of specimen  other than nasopharyngeal swab, presence of viral mutation(s) within the areas targeted by this assay, and inadequate number of viral copies (<250 copies / mL). A negative result must be combined with clinical observations, patient history, and epidemiological information.  Fact Sheet for Patients:   StrictlyIdeas.no  Fact Sheet for Healthcare Providers: BankingDealers.co.za  This test is not yet approved or  cleared by the [place] FDA and has been authorized for detection and/or diagnosis of SARS-CoV-2 by FDA under an Emergency Use Authorization (EUA).  This EUA will remain in effect (meaning this test can be used) for the duration of the COVID-19 declaration under Section 564(b)(1) of the Act, 21 U.S.C. section 360bbb-3(b)(1), unless the authorization is terminated or revoked sooner.  Performed at [facility], [address] [place], [place] [address]  Gram stain     Status: None   Collection Time: 01/01/20 11:40 AM   Specimen: Fluid  Result Value Ref Range Status   Specimen Description FLUID LEFT THORACENTESIS COLLECTED BY DOCTOR  Final   Special Requests NONE  Final   Gram Stain   Final    CYTOSPIN SMEAR WBC PRESENT, PREDOMINANTLY MONONUCLEAR NO ORGANISMS SEEN Performed at [facility] Performed at [facility], [address], [place], [place] [address]    Report Status 01/01/2020 FINAL  Final  Acid Fast Smear (AFB)     Status: None   Collection Time: 01/01/20  1:20 PM   Specimen: Ascitic; Body Fluid  Result Value Ref Range Status   AFB Specimen Processing Concentration  Final   Acid Fast Smear Negative  Final    Comment: (NOTE) Performed At: [facility] [place], [place] [id number] Rush Farmer MD RK:[id number]    Source (AFB) NONE  Corrected    Comment: Performed at [facility], [address], [place], [place] [address] CORRECTED ON 08/02 AT 1420: PREVIOUSLY REPORTED AS  ASCITIC   Culture, body fluid-bottle     Status: None (Preliminary result)   Collection Time: 01/01/20  1:20 PM   Specimen: Ascitic  Result Value Ref Range Status   Specimen Description ASCITIC  Final   Special Requests BOTTLES DRAWN AEROBIC AND ANAEROBIC 10CC EACH  Final   Culture   Final    NO GROWTH 2 DAYS Performed at [facility], [address], [place], [place] [address]    Report Status PENDING  Incomplete  Gram stain     Status: None   Collection Time: 01/01/20  1:20 PM   Specimen: Fluid  Result Value Ref Range Status   Specimen Description FLUID ASCITIC COLLECTED BY DOCTOR  Final   Special Requests NONE  Final   Gram Stain   Final    NO ORGANISMS SEEN WBC SEEN WBC PRESENT,BOTH PMN AND MONONUCLEAR CYTOSPIN SMEAR PERFORMED AT [facility] Performed at [facility], [address], [place], [place] [address]    Report Status 01/01/2020 FINAL  Final  MRSA PCR Screening     Status: None   Collection Time: 01/01/20  1:51 PM   Specimen: Nasal Mucosa; Nasopharyngeal  Result Value Ref Range Status   MRSA by PCR NEGATIVE NEGATIVE Final    Comment:        The GeneXpert MRSA Assay (FDA approved for NASAL specimens only), is one component of a comprehensive MRSA colonization surveillance program. It is not intended to diagnose MRSA infection nor to guide or monitor treatment for MRSA infections. Performed at [facility], [address] [place],  [address]       Radiology Studies: DG Chest 1 View  Result Date: 01/01/2020 CLINICAL DATA:  LEFT pleural effusion post thoracentesis EXAM: CHEST  1 VIEW COMPARISON:  Portable exam 1138 hours compared to 09/18/2019 FINDINGS: Dual lumen RIGHT jugular catheter with tip projecting over cavoatrial junction. Enlargement of cardiac silhouette with pulmonary vascular congestion. Bibasilar atelectasis. Lungs otherwise clear. No gross pleural effusion or pneumothorax. IMPRESSION: No pneumothorax following LEFT thoracentesis. Electronically  Signed   By: Lavonia Dana M.D.   On: 01/01/2020 13:51   US Paracentesis  Result Date: 01/01/2020 INDICATION: Alcoholic cirrhosis, end-stage renal disease on dialysis, ascites, question spontaneous bacterial peritonitis EXAM: ULTRASOUND GUIDED DIAGNOSTIC PARACENTESIS MEDICATIONS: None COMPLICATIONS: None immediate PROCEDURE: Informed written consent was obtained from the patient after a discussion of the risks, benefits and alternatives to treatment. A  timeout was performed prior to the initiation of the procedure. Initial ultrasound scanning demonstrates a large amount of ascites within the LEFT lower abdominal quadrant. The right lower abdomen was prepped and draped in the usual sterile fashion. 1% lidocaine was used for local anesthesia. Following this, a 5 [place] Yueh catheter was introduced. An ultrasound image was saved for documentation purposes. The paracentesis was performed. The catheter was removed and a dressing was applied. The patient tolerated the procedure well without immediate post procedural complication. FINDINGS: A total of approximately 240 mL of yellow ascitic fluid was removed. Samples were sent to the laboratory as requested by the clinical team. IMPRESSION: Successful ultrasound-guided paracentesis yielding 240 mL of peritoneal fluid. Electronically Signed   By: Lavonia Dana M.D.   On: 01/01/2020 14:13   ECHOCARDIOGRAM COMPLETE  Result Date: 01/01/2020    ECHOCARDIOGRAM REPORT   Patient Name:   Jeffrey [name] Date of Exam: 01/01/2020 Medical Rec #:  [id number]    Height:       67.0 in Accession #:    [id number]   Weight:       238.5 lb Date of Birth:  July 14, 1945     BSA:          2.180 m Patient Age:    54 years     BP:           163/70 mmHg Patient Gender: M            HR:           89 bpm. Exam Location:  [name] Procedure: 2D Echo Indications:    Congestive Heart Failure 428.0 / I50.9  History:        Patient has no prior history of Echocardiogram examinations.                 Risk  Factors:Diabetes and Former Smoker. Alcoholic cirrhosis ,                 ESRD.  Sonographer:    Leavy Cella RDCS (AE) Referring Phys: [phone] Royanne Foots [place]  1. Left ventricular ejection fraction, by estimation, is 70 to 75%. The left ventricle has normal function. The left ventricle has no regional wall motion abnormalities. Left ventricular diastolic parameters were normal.  2. Right ventricular systolic function was not well visualized. The right ventricular size is not well visualized.  3. Left atrial size was mildly dilated.  4. The mitral valve is normal in structure. No evidence of mitral valve regurgitation. No evidence of mitral stenosis.  5. The aortic valve has an indeterminant number of cusps. Aortic valve regurgitation is not visualized. Mild aortic valve stenosis.  6. The inferior vena cava is normal in size with greater than 50% respiratory variability, suggesting right atrial pressure of 3 mmHg. FINDINGS  Left Ventricle: Left ventricular ejection fraction, by estimation, is 70 to 75%. The left ventricle has normal function. The left ventricle has no regional wall motion abnormalities. The left ventricular internal cavity size was normal in size. There is  no left ventricular hypertrophy. Left ventricular diastolic parameters were normal. Right Ventricle: RV not well visualized, grossly appears normal in size and function. The right ventricular size is not well visualized. Right vetricular wall thickness was not assessed. Right ventricular systolic function was not well visualized. Left Atrium: Left atrial size was mildly dilated. Right Atrium: Right atrial size was normal in size. Pericardium: There is no evidence of pericardial effusion. Mitral Valve: The mitral  valve is normal in structure. No evidence of mitral valve regurgitation. No evidence of mitral valve stenosis. Tricuspid Valve: The tricuspid valve is normal in structure. Tricuspid valve regurgitation is not demonstrated. No  evidence of tricuspid stenosis. Aortic Valve: The aortic valve has an indeterminant number of cusps. . There is mild thickening and mild calcification of the aortic valve. Aortic valve regurgitation is not visualized. Mild aortic stenosis is present. Mild aortic valve annular calcification. There is mild thickening of the aortic valve. There is mild calcification of the aortic valve. Aortic valve mean gradient measures 9.3 mmHg. Aortic valve peak gradient measures 16.8 mmHg. Aortic valve area, by VTI measures 1.72 cm. Pulmonic Valve: The pulmonic valve was not well visualized. Pulmonic valve regurgitation is not visualized. No evidence of pulmonic stenosis. Aorta: The aortic root is normal in size and structure. Pulmonary Artery: Indeterminant PASP, inadequate TR jet. Venous: The inferior vena cava is normal in size with greater than 50% respiratory variability, suggesting right atrial pressure of 3 mmHg. IAS/Shunts: No atrial level shunt detected by color flow Doppler.  LEFT VENTRICLE PLAX 2D LVIDd:         4.52 cm  Diastology LVIDs:         2.60 cm  LV e' lateral:   12.00 cm/s LV PW:         1.00 cm  LV E/e' lateral: 9.2 LV IVS:        1.01 cm  LV e' medial:    7.72 cm/s LVOT diam:     2.10 cm  LV E/e' medial:  14.2 LV SV:         75 LV SV Index:   35 LVOT Area:     3.46 cm  RIGHT VENTRICLE RV S prime:     13.30 cm/s TAPSE (M-mode): 2.1 cm LEFT ATRIUM             Index       RIGHT ATRIUM           Index LA diam:        4.70 cm 2.16 cm/m  RA Area:     23.30 cm LA Vol (A2C):   67.6 ml 31.02 ml/m RA Volume:   79.60 ml  36.52 ml/m LA Vol (A4C):   72.3 ml 33.17 ml/m LA Biplane Vol: 70.4 ml 32.30 ml/m  AORTIC VALVE AV Area (Vmax):    1.69 cm AV Area (Vmean):   1.63 cm AV Area (VTI):     1.72 cm AV Vmax:           204.67 cm/s AV Vmean:          138.333 cm/s AV VTI:            0.438 m AV Peak Grad:      16.8 mmHg AV Mean Grad:      9.3 mmHg LVOT Vmax:         99.70 cm/s LVOT Vmean:        65.033 cm/s LVOT VTI:           0.217 m LVOT/AV VTI ratio: 0.50  AORTA Ao Root diam: 3.20 cm MITRAL VALVE MV Area (PHT): 3.56 cm     SHUNTS MV Decel Time: 213 msec     Systemic VTI:  0.22 m MV E velocity: 110.00 cm/s  Systemic Diam: 2.10 cm MV A velocity: 54.80 cm/s MV E/A ratio:  2.01 Carlyle Dolly MD Electronically signed by Carlyle Dolly MD Signature Date/Time: 01/01/2020/6:48:57 PM  Final     Scheduled Meds: . furosemide  40 mg Oral BID  . insulin aspart  0-9 Units Subcutaneous Q4H  . iron polysaccharides  150 mg Oral QHS  . lactulose  30 g Oral QID  . metoprolol succinate  50 mg Oral BID  . pantoprazole  40 mg Oral QAC breakfast  . pravastatin  20 mg Oral QPM  . rifaximin  550 mg Oral BID  . spironolactone  50 mg Oral q AM  . tamsulosin  0.4 mg Oral QPM  . thiamine  100 mg Oral q AM  . venlafaxine  75 mg Oral TID WC  . vitamin B-12  1,000 mcg Oral Daily    LOS: 3 days    Time spent: 35 minutes   Barton Dubois, MD [facility]  If 7PM-7AM, please contact night-coverage [email or web address] 01/03/2020, 11:56 AM

## 2020-01-03 NOTE — Progress Notes (Signed)
Inpatient Diabetes Program Recommendations  AACE/ADA: New Consensus Statement on Inpatient Glycemic Control (2015)  Target Ranges:  Prepandial:   less than 140 mg/dL      Peak postprandial:   less than 180 mg/dL (1-2 hours)      Critically ill patients:  140 - 180 mg/dL   Lab Results  Component Value Date   GLUCAP 44 (LL) 01/03/2020   HGBA1C 5.9 (H) 12/16/2019    Review of Glycemic Control Results for Jeffrey [name], Jeffrey [name] (MRN [id number]) as of 01/03/2020 11:43  Ref. Range 01/03/2020 03:26 01/03/2020 04:37 01/03/2020 07:27 01/03/2020 11:19  Glucose-Capillary Latest Ref Range: 70 - 99 mg/dL 47 (L) 109 (H) 40 (LL) 44 (LL)   Diabetes history: Type 2 DM Outpatient Diabetes medications: Humalog PRN, Tresiba 120 units QHS Current orders for Inpatient glycemic control: Novolog 0-9 units Q4H  Inpatient Diabetes Program Recommendations:    Noted continued hypoglycemia. Consider adding D10% @75  ml/hr.   Thanks, Bronson Curb, MSN, RNC-OB Diabetes Coordinator [phone] (8a-5p)

## 2020-01-04 ENCOUNTER — Ambulatory Visit (HOSPITAL_COMMUNITY): Payer: Medicare Other | Admitting: Hematology

## 2020-01-04 DIAGNOSIS — Z515 Encounter for palliative care: Secondary | ICD-10-CM

## 2020-01-04 DIAGNOSIS — Z7189 Other specified counseling: Secondary | ICD-10-CM

## 2020-01-04 LAB — URINALYSIS, ROUTINE W REFLEX MICROSCOPIC
Bilirubin Urine: NEGATIVE
Glucose, UA: NEGATIVE mg/dL
Ketones, ur: NEGATIVE mg/dL
Nitrite: NEGATIVE
Protein, ur: 30 mg/dL — AB
Specific Gravity, Urine: 1.016 (ref 1.005–1.030)
WBC, UA: >50 WBC/hpf — ABNORMAL HIGH (ref 0–5)
pH: 5 (ref 5.0–8.0)

## 2020-01-04 LAB — CORTISOL: Cortisol, Plasma: 46.4 ug/dL

## 2020-01-04 LAB — GLUCOSE, CAPILLARY
Glucose-Capillary: 100 mg/dL — ABNORMAL HIGH (ref 70–99)
Glucose-Capillary: 109 mg/dL — ABNORMAL HIGH (ref 70–99)
Glucose-Capillary: 129 mg/dL — ABNORMAL HIGH (ref 70–99)
Glucose-Capillary: 177 mg/dL — ABNORMAL HIGH (ref 70–99)
Glucose-Capillary: 189 mg/dL — ABNORMAL HIGH (ref 70–99)
Glucose-Capillary: 190 mg/dL — ABNORMAL HIGH (ref 70–99)
Glucose-Capillary: 215 mg/dL — ABNORMAL HIGH (ref 70–99)
Glucose-Capillary: 222 mg/dL — ABNORMAL HIGH (ref 70–99)

## 2020-01-04 MED ORDER — INSULIN ASPART 100 UNIT/ML ~~LOC~~ SOLN
0.0000 [IU] | Freq: Three times a day (TID) | SUBCUTANEOUS | Status: DC
  Administered 2020-01-04 – 2020-01-05 (×3): 1 [IU] via SUBCUTANEOUS
  Administered 2020-01-06: 2 [IU] via SUBCUTANEOUS
  Administered 2020-01-06: 3 [IU] via SUBCUTANEOUS

## 2020-01-04 NOTE — Progress Notes (Signed)
PROGRESS NOTE    Jeffrey [name]  ATF:[id number] DOB: 1945-09-04 DOA: 12/31/2019 PCP: Monico Blitz, MD   Brief Narrative:  Per HPI: Jeffrey [name] a 74 y.o.malewith medical history significant foralcoholic liver cirrhosis, ESRD(MWF), hypertension, diabetes mellitus type 2, hyperlipidemia, transfusion dependent anemia, portal hypertensive gastropathy, esophageal variceswho presents to the ED due to 2 week onset of intermittent right upper and lower abdominal pain which became worse in the past 4 days. Patient was unable to provide history possibly due to being lethargic (though easily arousable). He only kept repeating that the pain was 7/10 for all questions related to the abdominal pain. History was obtained from the ED physician and ED medical Record. Per report, Pain was sharp in nature and he denied increased abdominal swelling beyond his baseline. He was seen by his PCP (Dr Manuella Ghazi) 2 days ago who asked him to go to the ED for further evaluation but he was hoping that the pain will subside and today, pain worsened and wife noted that patient was more confused today, so he presented to the ED. There was no report of fever, chills, chest pain, SOB, N/V/D.Of note, patient was reported to have missed his dialysis on Friday, last dialysis was on Wednesday (12/27/2019).  Assessment & Plan:   Principal Problem:   Acute hepatic encephalopathy Active Problems:   Alcoholic cirrhosis of liver with ascites (HCC)   Type 2 diabetes mellitus (HCC)   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HCC)   Hyperkalemia   Hyponatremia   Pleural effusion   Hypoglycemia   Hyperammonemia (HCC)   Obesity (BMI 30-39.9)   Hypoalbuminemia   Hepatic encephalopathy (HCC)   Acute hepatic encephalopathy in the setting of hyperammonemia and hypoglycemia-persistent -Ammonia level 86>66>57>>21 after adjusted dose of lactulose -not compliant with lactulose at home. -will also continue rifaximin -Appreciate GI recommendations  and will follow suggestions. -continue PPI -paracentesis on 01/01/20, no signs of SBP -poor prognosis, palliative care consulted.   Moderate left and right pleural effusion -CT abdomen pelvis without contrast showed moderate left and small right pleural effusion with basilar airspace disease greatest on the left. -Underwent left-sided thoracentesis on 8/2 -2D echocardiogram ordered 8/2 with LVEF 70-75% and no other acute findings. -breathing has improved some after thoracentesis and paracentesis; still with signs of fluid overload, but better.   ESRD on HD (MWF) -Patient missed dialysis on Friday prior to admission. -Last hemodialysis 8/4 with repeat plans for HD today 8/6 -will follow renal service recommendations.  Hypoglycemia-intermittent in setting of type 2 diabetes -Patient presents with blood glucose in the high 20s, and IV D50 x1 was given. -has continue experiencing hypoglycemia in the setting of poor oral intake. -will attempt to wean patient off D10 infusion. -Continue holding all hypoglycemic agents.  -Continue to closely follow CBG levels -A1c 5.9 -Cortisol within normal limits.  Hyponatremia-improved -Na 130,possibly due to poor solute intake and missing hemodialysis prior to admission. -Improved after hemodialysis 8/2 with plans for repeat hemodialysis later today 8/4; sodium is 130 currently.  -Continue to follow electrolytes trend; next dialysis treatment on 01/05/2020.  Hyperkalemia -corrected with HD -continue to follow electrolytes trend.  History of alcoholic cirrhosis of liver with ascites -CT abdomen pelvis without contrast showed cirrhotic liver with signs of portal hypertension -No further alcohol use, confirmed with wife -Underwent paracentesis 8/2 with 29mL; fluid analysis no suggesting SBP. -continue lactulose and PPI.  Hypoalbuminemia -Alb 3.0,possibly related to patient's history of cirrhosis. -Consider protein supplement when patient is  able to tolerate  p.o. diet discussed  Transfusion dependent anemia -Currently downtrending Hgb -With fluid overload signs, hemodilution effect playing a role -continue Iron and epogen as per renal discretion  -No overt bleeding noted -follow Hgb and transfuse as needed; currently 7.8.  Class II obesity (Body mass index is 34.18 kg/m. ) -low calorie diet and portion controlled has been discussed with patient.  DNR -long discussion with wife about her husband status and guarded condition; she understand how sick he is and expressed poor quality of life prior to acute decompensation. She has expressed that in the event of requiring invasive intervention or artificial support, she will like her husband to be kept comfortable and not to exposed him to that. -patient code status has been changed to DNR/DNI base on that discussion. -palliative care will continue to follow for advance directives.  -continue to treat the treatable.   Fever: -Will check urinalysis and culture -Currently no dysuria, no complaints of shortness of breath, abdominal pain, nausea vomiting -Holding on antibiotics. -Continue as needed antipyretics.    DVT prophylaxis: SCDs Code Status: DNR/DNI Family Communication: no family at bedside, wife updated by phone. Disposition Plan:   Status is: Inpatient   Dispo: The patient is from: Home  Anticipated d/c is to: Home  Anticipated d/c date is: to be determined.   Patient currently is not medically stable to d/c. Continue Lactulose (at adjusted by GI), will also follow nephrology rec's regarding oral diuretics and HD. Condition remains guarded and overall prognosis reserved. Will discontinue D10 infusion now that patient is more alert and able to take oral. Paliative care consultation pending.  Follow CBG and clinical response.  Consultants:   Nephrology  GI  Palliative care  Procedures:   Left-sided thoracentesis  as well as paracentesis performed 8/2  Antimicrobials:  Anti-infectives (From admission, onward)   Start     Dose/Rate Route Frequency Ordered Stop   01/01/20 1100  rifaximin (XIFAXAN) tablet 550 mg     Discontinue     550 mg Oral 2 times daily 01/01/20 1038     01/01/20 0115  rifaximin (XIFAXAN) tablet 200 mg  Status:  Discontinued        200 mg Oral 2 times daily 01/01/20 0105 01/01/20 1038      Subjective: Patient spiked fever 101.4 overnight; no chest pain, no cough, no shortness of breath, no nausea, no vomiting, no abdominal pain.  Reports no dysuria.  Objective: Vitals:   01/04/20 0412 01/04/20 0625 01/04/20 0715 01/04/20 1031  BP: (!) 149/62 (!) 137/55  (!) 131/58  Pulse: 90 82  82  Resp: 18 20    Temp:  (!) 101.4 F (38.6 C) 99.4 F (37.4 C)   TempSrc:  Oral Oral   SpO2: 93% 96%    Weight:      Height:        Intake/Output Summary (Last 24 hours) at 01/04/2020 1205 Last data filed at 01/04/2020 1610 Gross per 24 hour  Intake 1103.18 ml  Output 3850 ml  Net -2746.82 ml   Filed Weights   01/03/20 0500 01/03/20 0950 01/03/20 1325  Weight: 102.9 kg 102.6 kg 99 kg    Examination: General exam: Alert, awake, oriented x 2, poor insight and situation awareness; denies chest pain, no shortness of breath, no nausea, no vomiting, no abdominal pain.  Weak and with improved interaction today. Respiratory system: Clear to auscultation. Respiratory effort normal. Cardiovascular system:RRR. No murmurs, rubs, gallops. Gastrointestinal system: Abdomen is nondistended, soft and nontender. No organomegaly  or masses felt. Normal bowel sounds heard. Central nervous system: Alert and oriented. No focal neurological deficits. Extremities: No cyanosis or clubbing; 1+ edema appreciated bilaterally. Skin: No rashes, no petechiae. Psychiatry: Mood & affect appropriate.  Underlying judgment and insight appears impaired secondary to dementia.  More interactive and following commands  properly.   Data Reviewed: I have personally reviewed following labs and imaging studies  CBC: Recent Labs  Lab 12/31/19 1909 01/01/20 0424 01/02/20 0347 01/03/20 0540  WBC 4.0 4.1 2.9* 4.1  NEUTROABS 3.3  --   --   --   HGB 8.3* 8.3* 7.7* 7.8*  HCT 27.7* 27.2* 26.0* 25.6*  MCV 96.5 96.1 97.0 97.0  PLT 123* 121* 116* 403*   Basic Metabolic Panel: Recent Labs  Lab 12/31/19 1909 01/01/20 0424 01/02/20 0347 01/03/20 0540  NA 123* 126* 130* 130*  K 5.7* 5.7* 4.5 4.5  CL 93* 93* 95* 93*  CO2 18* 22 24 23   GLUCOSE 102* 81 45* 89  BUN 44* 46* 25* 34*  CREATININE 4.73* 4.81* 3.13* 3.96*  CALCIUM 8.5* 8.6* 8.5* 9.0  MG  --  1.8  --  1.7  PHOS  --  7.2*  --   --    GFR: Estimated Creatinine Clearance: 18.9 mL/min (A) (by C-G formula based on SCr of 3.96 mg/dL (H)).   Liver Function Tests: Recent Labs  Lab 12/31/19 1909 01/01/20 0424 01/02/20 0347 01/03/20 0540  AST 29 26 31 27   ALT 25 24 26 24   ALKPHOS 174* 163* 165* 171*  BILITOT 0.7 0.8 0.7 0.9  PROT 7.1 6.9 7.2 7.3  ALBUMIN 3.1* 3.0* 3.0* 3.0*   Recent Labs  Lab 12/31/19 1909  LIPASE 23   Recent Labs  Lab 12/31/19 1909 01/01/20 0424 01/02/20 0347 01/03/20 0540  AMMONIA 86* 66* 57* 21   Coagulation Profile: Recent Labs  Lab 01/01/20 0424  INR 1.2   CBG: Recent Labs  Lab 01/04/20 0013 01/04/20 0409 01/04/20 0743 01/04/20 0753 01/04/20 1123  GLUCAP 177* 129* 109* 100* 222*   Sepsis Labs: Recent Labs  Lab 01/02/20 0347  LATICACIDVEN 1.2    Recent Results (from the past 240 hour(s))  SARS Coronavirus 2 by RT PCR (hospital order, performed in [facility] hospital lab) Nasopharyngeal Nasopharyngeal Swab     Status: None   Collection Time: 12/31/19  8:59 PM   Specimen: Nasopharyngeal Swab  Result Value Ref Range Status   SARS Coronavirus 2 NEGATIVE NEGATIVE Final    Comment: (NOTE) SARS-CoV-2 target nucleic acids are NOT DETECTED.  The SARS-CoV-2 RNA is generally detectable in upper  and lower respiratory specimens during the acute phase of infection. The lowest concentration of SARS-CoV-2 viral copies this assay can detect is 250 copies / mL. A negative result does not preclude SARS-CoV-2 infection and should not be used as the sole basis for treatment or other patient management decisions.  A negative result may occur with improper specimen collection / handling, submission of specimen other than nasopharyngeal swab, presence of viral mutation(s) within the areas targeted by this assay, and inadequate number of viral copies (<250 copies / mL). A negative result must be combined with clinical observations, patient history, and epidemiological information.  Fact Sheet for Patients:   StrictlyIdeas.no  Fact Sheet for Healthcare Providers: BankingDealers.co.za  This test is not yet approved or  cleared by the [place] FDA and has been authorized for detection and/or diagnosis of SARS-CoV-2 by FDA under an Emergency Use Authorization (EUA).  This  EUA will remain in effect (meaning this test can be used) for the duration of the COVID-19 declaration under Section 564(b)(1) of the Act, 21 U.S.C. section 360bbb-3(b)(1), unless the authorization is terminated or revoked sooner.  Performed at [facility], [address] [place], [place] [address]   Gram stain     Status: None   Collection Time: 01/01/20 11:40 AM   Specimen: Fluid  Result Value Ref Range Status   Specimen Description FLUID LEFT THORACENTESIS COLLECTED BY DOCTOR  Final   Special Requests NONE  Final   Gram Stain   Final    CYTOSPIN SMEAR WBC PRESENT, PREDOMINANTLY MONONUCLEAR NO ORGANISMS SEEN Performed at [facility] Performed at [facility], [address] [place], [place] [address]    Report Status 01/01/2020 FINAL  Final  Acid Fast Smear (AFB)     Status: None   Collection Time: 01/01/20  1:20 PM   Specimen: Ascitic; Body Fluid    Result Value Ref Range Status   AFB Specimen Processing Concentration  Final   Acid Fast Smear Negative  Final    Comment: (NOTE) Performed At: [facility] [place], [place] [id number] Rush Farmer MD ER:[id number]    Source (AFB) NONE  Corrected    Comment: Performed at [facility], [address], [place], [place] [address] CORRECTED ON 08/02 AT 1420: PREVIOUSLY REPORTED AS ASCITIC   Culture, body fluid-bottle     Status: None (Preliminary result)   Collection Time: 01/01/20  1:20 PM   Specimen: Ascitic  Result Value Ref Range Status   Specimen Description ASCITIC  Final   Special Requests BOTTLES DRAWN AEROBIC AND ANAEROBIC 10CC EACH  Final   Culture   Final    NO GROWTH 3 DAYS Performed at [facility], [address] [place], [place] [address]    Report Status PENDING  Incomplete  Gram stain     Status: None   Collection Time: 01/01/20  1:20 PM   Specimen: Fluid  Result Value Ref Range Status   Specimen Description FLUID ASCITIC COLLECTED BY DOCTOR  Final   Special Requests NONE  Final   Gram Stain   Final    NO ORGANISMS SEEN WBC SEEN WBC PRESENT,BOTH PMN AND MONONUCLEAR CYTOSPIN SMEAR PERFORMED AT [facility] Performed at [facility], [address], [place], [place] [address]    Report Status 01/01/2020 FINAL  Final  MRSA PCR Screening     Status: None   Collection Time: 01/01/20  1:51 PM   Specimen: Nasal Mucosa; Nasopharyngeal  Result Value Ref Range Status   MRSA by PCR NEGATIVE NEGATIVE Final    Comment:        The GeneXpert MRSA Assay (FDA approved for NASAL specimens only), is one component of a comprehensive MRSA colonization surveillance program. It is not intended to diagnose MRSA infection nor to guide or monitor treatment for MRSA infections. Performed at [facility], [address], [place],  [address]       Radiology Studies: No results found.  Scheduled Meds: . furosemide  40 mg Oral BID  . insulin aspart   0-6 Units Subcutaneous TID WC  . iron polysaccharides  150 mg Oral QHS  . lactulose  30 g Oral QID  . metoprolol succinate  50 mg Oral BID  . nystatin   Topical TID  . pantoprazole  40 mg Oral QAC breakfast  . pravastatin  20 mg Oral QPM  . rifaximin  550 mg Oral BID  .  spironolactone  50 mg Oral q AM  . tamsulosin  0.4 mg Oral QPM  . thiamine  100 mg Oral q AM  . venlafaxine  75 mg Oral TID WC  . vitamin B-12  1,000 mcg Oral Daily    LOS: 4 days    Time spent: 30 minutes   Barton Dubois, MD [facility]  If 7PM-7AM, please contact night-coverage [email or web address] 01/04/2020, 12:05 PM

## 2020-01-04 NOTE — Progress Notes (Signed)
Subjective:  Lethargic. Knows the president but not the place or year.   Objective: Vital signs in last 24 hours: Temp:  [98.7 F (37.1 C)-101.4 F (38.6 C)] 99.4 F (37.4 C) (08/05 0715) Pulse Rate:  [79-103] 82 (08/05 0625) Resp:  [16-22] 20 (08/05 0625) BP: (101-155)/(40-79) 137/55 (08/05 0625) SpO2:  [93 %-100 %] 96 % (08/05 0625) Weight:  [99 kg-102.6 kg] 99 kg (08/04 1325) Last BM Date: 01/04/20 General:   Lethargic cooperative in NAD Head:  Normocephalic and atraumatic. Eyes:  Sclera clear, no icterus.  Abdomen:  Soft, nontender and nondistended. No masses, hepatosplenomegaly or hernias noted. Normal bowel sounds, without guarding, and without rebound.   Extremities:  Without clubbing, deformity or edema. Neurologic:  Alert and  oriented x4;  grossly normal neurologically. Skin:  Intact without significant lesions or rashes. Psych:  Alert and cooperative. Normal mood and affect.  Intake/Output from previous day: 08/04 0701 - 08/05 0700 In: 1223.2 [P.O.:290; I.V.:683.2] Out: 3850 [Urine:350] Intake/Output this shift: No intake/output data recorded.  Lab Results: CBC Recent Labs    01/02/20 0347 01/03/20 0540  WBC 2.9* 4.1  HGB 7.7* 7.8*  HCT 26.0* 25.6*  MCV 97.0 97.0  PLT 116* 117*   BMET Recent Labs    01/02/20 0347 01/03/20 0540  NA 130* 130*  K 4.5 4.5  CL 95* 93*  CO2 24 23  GLUCOSE 45* 89  BUN 25* 34*  CREATININE 3.13* 3.96*  CALCIUM 8.5* 9.0   LFTs Recent Labs    01/02/20 0347 01/03/20 0540  BILITOT 0.7 0.9  ALKPHOS 165* 171*  AST 31 27  ALT 26 24  PROT 7.2 7.3  ALBUMIN 3.0* 3.0*   No results for input(s): LIPASE in the last 72 hours. PT/INR No results for input(s): LABPROT, INR in the last 72 hours.    Imaging Studies: CT ABDOMEN PELVIS W WO CONTRAST  Result Date: 01/01/2020 CLINICAL DATA:  Cirrhosis, abdominal pain,, abnormal liver concern for SMV thrombus on prior examination EXAM: CT ABDOMEN AND PELVIS WITHOUT AND WITH  CONTRAST TECHNIQUE: Multidetector CT imaging of the abdomen and pelvis was performed following the standard protocol before and following the bolus administration of intravenous contrast. CONTRAST:  140mL OMNIPAQUE IOHEXOL 300 MG/ML  SOLN COMPARISON:  CT abdomen pelvis, 12/31/2019 FINDINGS: Lower chest: Unchanged moderate left, small right pleural effusions and associated atelectasis or consolidation. Coronary artery calcifications. Hepatobiliary: Cirrhotic morphology of the liver. There is unchanged, irregular subcapsular hypodensity of the anterior dome of the liver (series 12, image 119) without suspicious contrast enhancement or focal lesion, this appearance not changed compared to prior examinations dating back to 01/10/2016. Gallstones in the contracted gallbladder. No biliary ductal dilatation. Pancreas: Unremarkable. No pancreatic ductal dilatation or surrounding inflammatory changes. Spleen: Splenomegaly, maximum coronal span 17.0 cm. Adrenals/Urinary Tract: Adrenal glands are unremarkable. Kidneys are normal, without renal calculi, solid lesion, or hydronephrosis. Bladder is decompressed by Foley catheter. Stomach/Bowel: Stomach is within normal limits. Appendix is not clearly visualized. No evidence of bowel wall thickening, distention, or inflammatory changes. Occasional sigmoid diverticula. Vascular/Lymphatic: Aortic atherosclerosis. There is some eccentric calcification of the central portions of the superior mesenteric vein near the confluence with the portal vein; there is no significant intraluminal thrombus (series 12, image 58). No enlarged abdominal or pelvic lymph nodes. Reproductive: No mass or other significant abnormality. Other: No abdominal wall hernia or abnormality. Small volume ascites in the abdomen and pelvis, unchanged compared to prior examination. Musculoskeletal: No acute or significant osseous findings. IMPRESSION:  1. Cirrhotic morphology of the liver. There is unchanged,  irregular subcapsular hypodensity of the anterior dome of the liver without suspicious contrast enhancement or focal lesion, this appearance not changed compared to prior examinations dating back to 01/10/2016 and perhaps related to scarring or fibrosis. 2. There is some eccentric calcification of the central portions of the superior mesenteric vein near the confluence with the portal vein; there is no significant intraluminal thrombus. 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 3. Splenomegaly. 4. Cholelithiasis. 5. Small volume ascites in the abdomen and pelvis, unchanged compared to prior examination. 6. Unchanged moderate left, small right pleural effusions and associated atelectasis or consolidation. 7. Coronary artery disease.  Aortic Atherosclerosis (ICD10-I70.0). Electronically Signed   By: Eddie Candle M.D.   On: 01/01/2020 11:22   CT ABDOMEN PELVIS WO CONTRAST  Result Date: 12/31/2019 CLINICAL DATA:  Abdominal distension, abdominal pain for 2 weeks. EXAM: CT ABDOMEN AND PELVIS WITHOUT CONTRAST TECHNIQUE: Multidetector CT imaging of the abdomen and pelvis was performed following the standard protocol without IV contrast. COMPARISON:  January 10, 2016 FINDINGS: Lower chest: Moderate LEFT and small RIGHT pleural effusion with basilar airspace disease also greatest on the LEFT. Central venous access device terminates at the caval to atrial junction incompletely imaged. No pericardial fluid. Hepatobiliary: Heterogeneity of the RIGHT hepatic lobe showing linear and branching appearance grossly similar to the study of January 10, 2016. Frankly cirrhotic liver. Cholelithiasis. Gallbladder is collapsed. There is generalized stranding throughout the abdomen nonspecific to the gallbladder. Focal low attenuation in the RIGHT hemi liver on image 15 of series 2 2.5 x 3.4 cm. Pancreas: Pancreas is normal without ductal dilation, gross inflammation or contour change from previous  imaging study. Spleen: Spleen remains enlarged. Signs of portosystemic collaterals in the upper abdomen. This includes splenorenal shunting. Adrenals/Urinary Tract: Adrenal glands grossly normal though LEFT adrenal is partially obscured by retroperitoneal venous collaterals. No hydronephrosis. No perinephric stranding. Urinary bladder collapsed with Foley catheter in situ. No nephrolithiasis. No ureteral calculi. Stomach/Bowel: Stool throughout the colon. Generalized edema in the abdominal mesenteries. Small volume ascites mainly in the LEFT hemiabdomen. No perienteric or pericolonic stranding. Signs of colonic diverticulosis, mild. Vascular/Lymphatic: Calcified atheromatous plaque in the abdominal aorta. No aneurysmal dilation. No adenopathy in the retroperitoneum. No adenopathy in the pelvis. 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 Reproductive: Prostate grossly normal by CT. Other: Body wall edema and skin thickening over a lower abdominal pannus. Skin thickening was present previously. Interval abdominal wall reconstruction and repair of umbilical hernia since the prior study. Generalized body wall edema. Musculoskeletal: No acute musculoskeletal process. Spinal degenerative changes. IMPRESSION: 1. Moderate LEFT and small RIGHT pleural effusion with basilar airspace disease also greatest on the LEFT. Airspace disease likely volume loss associated with effusion. 2. Calcification of the periphery of the SMV and portal venous system can be seen in the setting of chronic thrombosis. CT or multiphase MRI may be helpful as clinically warranted. 3. Cirrhotic liver with signs of portal hypertension. Generalized edema could be also related to liver disease. Focal area of low attenuation in the RIGHT hemi liver may represent confluent fibrosis the given severity of cirrhotic changes however, would suggest follow-up with multiphase  CT or MRI as dictated by the patient's renal function. 4. Small volume ascites mainly in the LEFT hemiabdomen. The location of the ascites raising the question of some loculation. No peripheral rind is suggested  on noncontrast imaging. Correlate with any clinical symptoms of or history of SBP. 5. Cholelithiasis with collapsed gallbladder and with generalized stranding in the abdomen in the setting of portal hypertension. 6. Interval abdominal wall reconstruction and repair of umbilical hernia since the prior study. 7. Body wall edema and skin thickening over a lower abdominal pannus. Skin thickening was present previously. Correlate with any clinical evidence of cellulitis. 8. Aortic atherosclerosis. These results were called by telephone at the time of interpretation on 12/31/2019 at 7:44 pm to provider JULIE IDOL , who verbally acknowledged these results. Aortic Atherosclerosis (ICD10-I70.0). Electronically Signed   By: Zetta Bills M.D.   On: 12/31/2019 19:38   DG Chest 1 View  Result Date: 01/01/2020 CLINICAL DATA:  LEFT pleural effusion post thoracentesis EXAM: CHEST  1 VIEW COMPARISON:  Portable exam 1138 hours compared to 09/18/2019 FINDINGS: Dual lumen RIGHT jugular catheter with tip projecting over cavoatrial junction. Enlargement of cardiac silhouette with pulmonary vascular congestion. Bibasilar atelectasis. Lungs otherwise clear. No gross pleural effusion or pneumothorax. IMPRESSION: No pneumothorax following LEFT thoracentesis. Electronically Signed   By: Lavonia Dana M.D.   On: 01/01/2020 13:51   CT Head Wo Contrast  Result Date: 12/13/2019 CLINICAL DATA:  Altered mental status. EXAM: CT HEAD WITHOUT CONTRAST TECHNIQUE: Contiguous axial images were obtained from the base of the skull through the vertex without intravenous contrast. COMPARISON:  September 08, 2019. FINDINGS: Brain: Mild chronic ischemic white matter disease is noted. No mass effect or midline shift is noted. Ventricular size is within  normal limits. There is no evidence of mass lesion, hemorrhage or acute infarction. Vascular: No hyperdense vessel or unexpected calcification. Skull: Normal. Negative for fracture or focal lesion. Sinuses/Orbits: No acute finding. Other: None. IMPRESSION: Mild chronic ischemic white matter disease. No acute intracranial abnormality seen. Electronically Signed   By: Marijo Conception M.D.   On: 12/13/2019 13:07   US Paracentesis  Result Date: 01/01/2020 INDICATION: Alcoholic cirrhosis, end-stage renal disease on dialysis, ascites, question spontaneous bacterial peritonitis EXAM: ULTRASOUND GUIDED DIAGNOSTIC PARACENTESIS MEDICATIONS: None COMPLICATIONS: None immediate PROCEDURE: Informed written consent was obtained from the patient after a discussion of the risks, benefits and alternatives to treatment. A timeout was performed prior to the initiation of the procedure. Initial ultrasound scanning demonstrates a large amount of ascites within the LEFT lower abdominal quadrant. The right lower abdomen was prepped and draped in the usual sterile fashion. 1% lidocaine was used for local anesthesia. Following this, a 5 [place] Yueh catheter was introduced. An ultrasound image was saved for documentation purposes. The paracentesis was performed. The catheter was removed and a dressing was applied. The patient tolerated the procedure well without immediate post procedural complication. FINDINGS: A total of approximately 240 mL of yellow ascitic fluid was removed. Samples were sent to the laboratory as requested by the clinical team. IMPRESSION: Successful ultrasound-guided paracentesis yielding 240 mL of peritoneal fluid. Electronically Signed   By: Lavonia Dana M.D.   On: 01/01/2020 14:13   ECHOCARDIOGRAM COMPLETE  Result Date: 01/01/2020    ECHOCARDIOGRAM REPORT   Patient Name:   Jeffrey [name] Date of Exam: 01/01/2020 Medical Rec #:  [id number]    Height:       67.0 in Accession #:    [id number]   Weight:       238.5 lb  Date of Birth:  1945/07/22     BSA:          2.180 m Patient Age:  73 years     BP:           163/70 mmHg Patient Gender: M            HR:           89 bpm. Exam Location:  [name] Procedure: 2D Echo Indications:    Congestive Heart Failure 428.0 / I50.9  History:        Patient has no prior history of Echocardiogram examinations.                 Risk Factors:Diabetes and Former Smoker. Alcoholic cirrhosis ,                 ESRD.  Sonographer:    Leavy Cella RDCS (AE) Referring Phys: [phone] Royanne Foots Vanduser  1. Left ventricular ejection fraction, by estimation, is 70 to 75%. The left ventricle has normal function. The left ventricle has no regional wall motion abnormalities. Left ventricular diastolic parameters were normal.  2. Right ventricular systolic function was not well visualized. The right ventricular size is not well visualized.  3. Left atrial size was mildly dilated.  4. The mitral valve is normal in structure. No evidence of mitral valve regurgitation. No evidence of mitral stenosis.  5. The aortic valve has an indeterminant number of cusps. Aortic valve regurgitation is not visualized. Mild aortic valve stenosis.  6. The inferior vena cava is normal in size with greater than 50% respiratory variability, suggesting right atrial pressure of 3 mmHg. FINDINGS  Left Ventricle: Left ventricular ejection fraction, by estimation, is 70 to 75%. The left ventricle has normal function. The left ventricle has no regional wall motion abnormalities. The left ventricular internal cavity size was normal in size. There is  no left ventricular hypertrophy. Left ventricular diastolic parameters were normal. Right Ventricle: RV not well visualized, grossly appears normal in size and function. The right ventricular size is not well visualized. Right vetricular wall thickness was not assessed. Right ventricular systolic function was not well visualized. Left Atrium: Left atrial size was mildly dilated.  Right Atrium: Right atrial size was normal in size. Pericardium: There is no evidence of pericardial effusion. Mitral Valve: The mitral valve is normal in structure. No evidence of mitral valve regurgitation. No evidence of mitral valve stenosis. Tricuspid Valve: The tricuspid valve is normal in structure. Tricuspid valve regurgitation is not demonstrated. No evidence of tricuspid stenosis. Aortic Valve: The aortic valve has an indeterminant number of cusps. . There is mild thickening and mild calcification of the aortic valve. Aortic valve regurgitation is not visualized. Mild aortic stenosis is present. Mild aortic valve annular calcification. There is mild thickening of the aortic valve. There is mild calcification of the aortic valve. Aortic valve mean gradient measures 9.3 mmHg. Aortic valve peak gradient measures 16.8 mmHg. Aortic valve area, by VTI measures 1.72 cm. Pulmonic Valve: The pulmonic valve was not well visualized. Pulmonic valve regurgitation is not visualized. No evidence of pulmonic stenosis. Aorta: The aortic root is normal in size and structure. Pulmonary Artery: Indeterminant PASP, inadequate TR jet. Venous: The inferior vena cava is normal in size with greater than 50% respiratory variability, suggesting right atrial pressure of 3 mmHg. IAS/Shunts: No atrial level shunt detected by color flow Doppler.  LEFT VENTRICLE PLAX 2D LVIDd:         4.52 cm  Diastology LVIDs:         2.60 cm  LV e' lateral:   12.00 cm/s LV  PW:         1.00 cm  LV E/e' lateral: 9.2 LV IVS:        1.01 cm  LV e' medial:    7.72 cm/s LVOT diam:     2.10 cm  LV E/e' medial:  14.2 LV SV:         75 LV SV Index:   35 LVOT Area:     3.46 cm  RIGHT VENTRICLE RV S prime:     13.30 cm/s TAPSE (M-mode): 2.1 cm LEFT ATRIUM             Index       RIGHT ATRIUM           Index LA diam:        4.70 cm 2.16 cm/m  RA Area:     23.30 cm LA Vol (A2C):   67.6 ml 31.02 ml/m RA Volume:   79.60 ml  36.52 ml/m LA Vol (A4C):   72.3 ml  33.17 ml/m LA Biplane Vol: 70.4 ml 32.30 ml/m  AORTIC VALVE AV Area (Vmax):    1.69 cm AV Area (Vmean):   1.63 cm AV Area (VTI):     1.72 cm AV Vmax:           204.67 cm/s AV Vmean:          138.333 cm/s AV VTI:            0.438 m AV Peak Grad:      16.8 mmHg AV Mean Grad:      9.3 mmHg LVOT Vmax:         99.70 cm/s LVOT Vmean:        65.033 cm/s LVOT VTI:          0.217 m LVOT/AV VTI ratio: 0.50  AORTA Ao Root diam: 3.20 cm MITRAL VALVE MV Area (PHT): 3.56 cm     SHUNTS MV Decel Time: 213 msec     Systemic VTI:  0.22 m MV E velocity: 110.00 cm/s  Systemic Diam: 2.10 cm MV A velocity: 54.80 cm/s MV E/A ratio:  2.01 Carlyle Dolly MD Electronically signed by Carlyle Dolly MD Signature Date/Time: 01/01/2020/6:48:57 PM    Final    US THORACENTESIS ASP PLEURAL SPACE W/IMG GUIDE  Result Date: 01/01/2020 INDICATION: Pleural effusion, history alcoholic cirrhosis, atrial fibrillation, end-stage renal disease on dialysis, diabetes mellitus, hypertension EXAM: ULTRASOUND GUIDED DIAGNOSTIC LEFT THORACENTESIS MEDICATIONS: None. COMPLICATIONS: None immediate. PROCEDURE: An ultrasound guided thoracentesis was thoroughly discussed with the patient and questions answered. The benefits, risks, alternatives and complications were also discussed. The patient understands and wishes to proceed with the procedure. Written consent was obtained. Ultrasound was performed to localize and mark an adequate pocket of fluid in the LEFT chest. The area was then prepped and draped in the normal sterile fashion. 1% Lidocaine was used for local anesthesia. Under ultrasound guidance a 5 [place] Yueh catheter was introduced. Thoracentesis was performed. The catheter was removed and a dressing applied. FINDINGS: A total of approximately 240 mL of dark yellow LEFT pleural fluid was removed. Samples were sent to the laboratory as requested by the clinical team. IMPRESSION: Successful ultrasound guided diagnostic LEFT thoracentesis yielding 240  mL of pleural fluid. Electronically Signed   By: Lavonia Dana M.D.   On: 01/01/2020 13:50  [2 weeks]   Assessment:  74 year old male with history of cirrhosis due to prior alcohol use (meld sodium 29, accounting for dialysis on admission), end-stage renal disease on  dialysis, transfusion dependent anemia status post extensive evaluation(EGD X 2, colonoscopy, capsule endoscopy), SBP last year at outside hospital but unable to be on prophylaxis due to history of recurrent Cdiff, known varices but unable to take non-selective beta blocker therapy, encephalopathy, presenting this admission with abdominal pain and acute encephalopathy. Found to have pleural effusions, s/p thoracentesis. Negative cell count for SBP on diagnostic para on admission.  Encephalopathy: he has had compliance issues at home with medications. Easily awakens but not back at baseline today. Ammonia level was better yesterday. 3 BMs in last 24 hours. Continue with lactulose and xifaxan.  Anemia: Chronic transfusion dependent.  No overt GI bleeding.  Continue Protonix daily. Transfuse as needed.   End-stage renal disease on hemodialysis.  Diuretic management per nephrology.  Fever: Fever earlier this morning of 101.4. Attending aware. U/A with culture planned. Previously no signs of SBP on diagnostic tap, cultures remain negative.   Plan: 1. Transfuse as needed.  2. Awaiting palliative input given guarded prognosis.   Laureen Ochs. Bernarda Caffey [facility] [phone] 8/5/20211:40 PM     LOS: 4 days

## 2020-01-04 NOTE — Progress Notes (Signed)
Nephrology Follow-Up Consult Note [facility]   Assessment/Recommendations: Jeffrey [name] is a/an 74 y.o. male with a past medical history significant for ESRD, ESLD, alcohol use disorder, DM 2 admitted for abdominal pain.      ESRD: MWF at [facility]. S/p HD on 8/2 tolerated well.  No indication for hemodialysis today we will plan for scheduled treatment tomorrow Volume/HTN: Continues to be volume overloaded and hypertensive (albeit better today).  Continue home Lasix, metoprolol, spironolactone. Will continue to aim for net uf of 3.5L with HD tomorrow ESLD/alcohol use disorder: Gastroenterology and palliative care following.  Appreciate assistance.  Primary team increasing lactulose given confusion Hyponatremia: Related to free water retention.  Managing with dialysis. Poor prognostic indicator in cirrhosis Failure to thrive/hypoglycemia: Primary team managing Diabetes mellitus type 2 uncontrolled with recent hypoglycemia: Management per primary team Abdominal pain: No signs of SBP on paracentesis   Recommendations conveyed to primary service.    [facility] 01/04/2020 9:53 AM  ___________________________________________________________  CC: ESRD  Interval History/Subjective: transferred to floor, lethargic but arousable. When prompted says no to chest pain, shortness of breath, issues with AVF. S/p 3.5L UF with HD yesterday, no complications   Medications:  Current Facility-Administered Medications  Medication Dose Route Frequency Provider Last Rate Last Admin  . acetaminophen (TYLENOL) tablet 650 mg  650 mg Oral Q6H PRN Lang Snow, FNP   650 mg at 01/04/20 8756   Or  . acetaminophen (TYLENOL) suppository 650 mg  650 mg Rectal Q4H PRN Lang Snow, FNP      . dextrose 10 % infusion   Intravenous Continuous Barton Dubois, MD 50 mL/hr at 01/04/20 0641 New Bag at 01/04/20 0641  . furosemide (LASIX) tablet 40 mg  40 mg Oral BID  Heath Lark D, DO   40 mg at 01/04/20 0757  . HYDROcodone-acetaminophen (NORCO/VICODIN) 5-325 MG per tablet 1 tablet  1 tablet Oral Q6H PRN Manuella Ghazi, Pratik D, DO   1 tablet at 01/03/20 1730  . insulin aspart (novoLOG) injection 0-9 Units  0-9 Units Subcutaneous Q4H Adefeso, Oladapo, DO   1 Units at 01/04/20 0416  . iron polysaccharides (NIFEREX) capsule 150 mg  150 mg Oral QHS Shah, Pratik D, DO   150 mg at 01/03/20 2014  . labetalol (NORMODYNE) injection 10 mg  10 mg Intravenous Q2H PRN Manuella Ghazi, Pratik D, DO      . lactulose (CHRONULAC) 10 GM/15ML solution 30 g  30 g Oral QID Annitta Needs, NP   30 g at 01/03/20 2015  . metoprolol succinate (TOPROL-XL) 24 hr tablet 50 mg  50 mg Oral BID Manuella Ghazi, Pratik D, DO   50 mg at 01/03/20 2014  . nystatin (MYCOSTATIN/NYSTOP) topical powder   Topical TID Lang Snow, FNP   Given at 01/03/20 2218  . pantoprazole (PROTONIX) EC tablet 40 mg  40 mg Oral QAC breakfast Erenest Rasher, PA-C   40 mg at 01/04/20 4332  . pravastatin (PRAVACHOL) tablet 20 mg  20 mg Oral QPM Shah, Pratik D, DO   20 mg at 01/03/20 1716  . rifaximin (XIFAXAN) tablet 550 mg  550 mg Oral BID Heath Lark D, DO   550 mg at 01/03/20 2014  . spironolactone (ALDACTONE) tablet 50 mg  50 mg Oral q AM Manuella Ghazi, Pratik D, DO   50 mg at 01/04/20 9518  . tamsulosin (FLOMAX) capsule 0.4 mg  0.4 mg Oral QPM Manuella Ghazi, Pratik D, DO   0.4 mg at 01/03/20 1716  .  thiamine tablet 100 mg  100 mg Oral q AM Manuella Ghazi, Pratik D, DO   100 mg at 01/04/20 2703  . venlafaxine (EFFEXOR) tablet 75 mg  75 mg Oral TID WC Shah, Pratik D, DO   75 mg at 01/04/20 0757  . vitamin B-12 (CYANOCOBALAMIN) tablet 1,000 mcg  1,000 mcg Oral Daily Manuella Ghazi, Pratik D, DO   1,000 mcg at 01/03/20 5009      Review of Systems: 10 systems reviewed and negative except per interval history/subjective  Physical Exam: Vitals:   01/04/20 0625 01/04/20 0715  BP: (!) 137/55   Pulse: 82   Resp: 20   Temp: (!) 101.4 F (38.6 C) 99.4 F (37.4 C)  SpO2:  96%    No intake/output data recorded.  Intake/Output Summary (Last 24 hours) at 01/04/2020 0953 Last data filed at 01/04/2020 3818 Gross per 24 hour  Intake 1103.18 ml  Output 3850 ml  Net -2746.82 ml   Constitutional: Chronically ill-appearing, sitting in bed, intermittently in distress with moaning ENMT: ears and nose without scars or lesions, MMM CV: normal rate, 2+ pitting edema in the bilateral lower extremities Respiratory: Bilateral chest rise, normal work of breathing Gastrointestinal: soft, non-tender, no palpable masses or hernias Skin: no visible lesions or rashes MSK: LUE avf with +b/t, ecchymosis surrounding avf Psych: alert, poor insight and judgment, appropriate mood   Test Results I personally reviewed new and old clinical labs and radiology tests Lab Results  Component Value Date   NA 130 (L) 01/03/2020   K 4.5 01/03/2020   CL 93 (L) 01/03/2020   CO2 23 01/03/2020   BUN 34 (H) 01/03/2020   CREATININE 3.96 (H) 01/03/2020   CALCIUM 9.0 01/03/2020   ALBUMIN 3.0 (L) 01/03/2020   PHOS 7.2 (H) 01/01/2020

## 2020-01-05 DIAGNOSIS — N186 End stage renal disease: Secondary | ICD-10-CM

## 2020-01-05 DIAGNOSIS — E162 Hypoglycemia, unspecified: Secondary | ICD-10-CM

## 2020-01-05 DIAGNOSIS — E7220 Disorder of urea cycle metabolism, unspecified: Secondary | ICD-10-CM

## 2020-01-05 DIAGNOSIS — Z794 Long term (current) use of insulin: Secondary | ICD-10-CM

## 2020-01-05 DIAGNOSIS — E1122 Type 2 diabetes mellitus with diabetic chronic kidney disease: Secondary | ICD-10-CM

## 2020-01-05 DIAGNOSIS — E669 Obesity, unspecified: Secondary | ICD-10-CM

## 2020-01-05 DIAGNOSIS — K7290 Hepatic failure, unspecified without coma: Secondary | ICD-10-CM

## 2020-01-05 DIAGNOSIS — E8809 Other disorders of plasma-protein metabolism, not elsewhere classified: Secondary | ICD-10-CM

## 2020-01-05 LAB — GLUCOSE, CAPILLARY
Glucose-Capillary: 106 mg/dL — ABNORMAL HIGH (ref 70–99)
Glucose-Capillary: 107 mg/dL — ABNORMAL HIGH (ref 70–99)
Glucose-Capillary: 132 mg/dL — ABNORMAL HIGH (ref 70–99)
Glucose-Capillary: 156 mg/dL — ABNORMAL HIGH (ref 70–99)
Glucose-Capillary: 179 mg/dL — ABNORMAL HIGH (ref 70–99)
Glucose-Capillary: 192 mg/dL — ABNORMAL HIGH (ref 70–99)

## 2020-01-05 LAB — URINE CULTURE

## 2020-01-05 MED ORDER — ALTEPLASE 2 MG IJ SOLR: 2.0000 mg | Freq: Once | INTRAMUSCULAR | Status: DC | PRN

## 2020-01-05 MED ORDER — SODIUM CHLORIDE 0.9 % IV SOLN: 100.0000 mL | INTRAVENOUS | Status: DC | PRN

## 2020-01-05 MED ORDER — HEPARIN SODIUM (PORCINE) 1000 UNIT/ML DIALYSIS: 1000.0000 [IU] | INTRAMUSCULAR | Status: DC | PRN

## 2020-01-05 NOTE — Care Management Important Message (Signed)
Important Message  Patient Details  Name: Jeffrey [name] MRN: [id number] Date of Birth: [birth year]   Medicare Important Message Given:  Yes     [name] 01/05/2020, 1:57 PM

## 2020-01-05 NOTE — Procedures (Signed)
     HEMODIALYSIS TREATMENT NOTE:   3.5 hour heparin-free HD completed via [facility] T[facility].  Goal NOT met: Unable to tolerate removal of 3.5 liters as ordered.  Fluid removal limited by hypotension.  Net UF 1.8L.  All blood was returned.  No changes from pre-HD assessment.  Rockwell Alexandria, RN

## 2020-01-05 NOTE — Progress Notes (Signed)
PROGRESS NOTE    Jeffrey [name]  WYO:[id number] DOB: 1945/09/08 DOA: 12/31/2019 PCP: Monico Blitz, MD   Brief Narrative:  Per HPI: Jeffrey [name] a 74 y.o.malewith medical history significant foralcoholic liver cirrhosis, ESRD(MWF), hypertension, diabetes mellitus type 2, hyperlipidemia, transfusion dependent anemia, portal hypertensive gastropathy, esophageal variceswho presents to the ED due to 2 week onset of intermittent right upper and lower abdominal pain which became worse in the past 4 days. Patient was unable to provide history possibly due to being lethargic (though easily arousable). He only kept repeating that the pain was 7/10 for all questions related to the abdominal pain. History was obtained from the ED physician and ED medical Record. Per report, Pain was sharp in nature and he denied increased abdominal swelling beyond his baseline. He was seen by his PCP (Dr Manuella Ghazi) 2 days ago who asked him to go to the ED for further evaluation but he was hoping that the pain will subside and today, pain worsened and wife noted that patient was more confused today, so he presented to the ED. There was no report of fever, chills, chest pain, SOB, N/V/D.Of note, patient was reported to have missed his dialysis on Friday, last dialysis was on Wednesday (12/27/2019).  Assessment & Plan:   Principal Problem:   Acute hepatic encephalopathy Active Problems:   Alcoholic cirrhosis of liver with ascites (HCC)   Type 2 diabetes mellitus (HCC)   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HCC)   Hyperkalemia   Hyponatremia   Pleural effusion   Hypoglycemia   Hyperammonemia (HCC)   Obesity (BMI 30-39.9)   Hypoalbuminemia   Hepatic encephalopathy (HCC)   Acute hepatic encephalopathy in the setting of hyperammonemia and hypoglycemia-persistent -Ammonia level 86>66>57>>21 after adjusted dose of lactulose -not compliant with lactulose at home. -will also continue rifaximin -Appreciate GI recommendations  and will follow suggestions. -continue PPI -paracentesis on 01/01/20, no signs of SBP -poor prognosis, palliative care consulted.   Moderate left and right pleural effusion -CT abdomen pelvis without contrast showed moderate left and small right pleural effusion with basilar airspace disease greatest on the left. -Underwent left-sided thoracentesis on 8/2 -2D echocardiogram ordered 8/2 with LVEF 70-75% and no other acute findings. -breathing has improved some after thoracentesis and paracentesis; still with signs of fluid overload, but better.   ESRD on HD (MWF) -Patient missed dialysis on Friday prior to admission. -Last hemodialysis 8/4 with repeat plans for HD later today 8/6 -will follow renal service recommendations.  Hypoglycemia-intermittent in setting of type 2 diabetes -Patient presents with blood glucose in the high 20s, and IV D50 x1 was given. -has continue experiencing hypoglycemia in the setting of poor oral intake. -will attempt to wean patient off D10 infusion. -Continue holding all hypoglycemic agents.  -Continue to closely following CBG levels -A1c 5.9 -Cortisol within normal limits.  Hyponatremia-improved -Na 130,possibly due to poor solute intake and missing hemodialysis prior to admission. -Improved after hemodialysis 8/2 with plans for repeat hemodialysis later today 8/4; sodium is 130 currently.  -Continue to follow electrolytes trend; next dialysis treatment later today 01/05/2020.  Hyperkalemia -corrected with HD -continue to follow electrolytes trend.  History of alcoholic cirrhosis of liver with ascites -CT abdomen pelvis without contrast showed cirrhotic liver with signs of portal hypertension -No further alcohol use, confirmed with wife -Underwent paracentesis 8/2 with 218mL; fluid analysis no suggesting SBP. -continue lactulose and PPI.  Hypoalbuminemia -Alb 3.0,possibly related to patient's history of cirrhosis. -Consider protein  supplement when patient is able  to tolerate p.o. diet discussed  Transfusion dependent anemia -Currently downtrending Hgb -With fluid overload signs, hemodilution effect playing a role -continue Iron and epogen as per renal discretion  -No overt bleeding noted -follow Hgb and transfuse as needed; currently 7.8.  Class II obesity (Body mass index is 34.18 kg/m. ) -low calorie diet and portion controlled has been discussed with patient.  DNR -long discussion with wife about her husband status and guarded condition; she understand how sick he is and expressed poor quality of life prior to acute decompensation. She has expressed that in the event of requiring invasive intervention or artificial support, she will like her husband to be kept comfortable and not to exposed him to that. -patient code status has been changed to DNR/DNI base on that discussion. -palliative care will continue to follow for advance directives.  -continue to treat the treatable.   Fever: -Will check urinalysis and culture -No nitrates on UA; urine culture with multiple microorganism; not specific infection. -Currently afebrile.  Continues denying dysuria.  Normal WBCs. -Continue holding antibiotics for now. -Continue as needed antipyretics.  Physical deconditioning: -Patient oriented, awake and able to follow commands currently. -PT evaluation and recommendations requested.    DVT prophylaxis: SCDs Code Status: DNR/DNI Family Communication: no family at bedside, wife updated by phone. Disposition Plan:   Status is: Inpatient   Dispo: The patient is from: Home  Anticipated d/c is to: Home  Anticipated d/c date is: to be determined.   Patient currently is not medically stable to d/c. Continue Lactulose (at current dose adjusted by GI), will also follow nephrology rec's regarding oral diuretics and HD. Condition remains guarded and overall prognosis reserved.   Patient with improved oral intake and no further episode of hypoglycemia.  Consultants:   Nephrology  GI  Palliative care  Procedures:   Left-sided thoracentesis as well as paracentesis performed 8/2  Antimicrobials:  Anti-infectives (From admission, onward)   Start     Dose/Rate Route Frequency Ordered Stop   01/01/20 1100  rifaximin (XIFAXAN) tablet 550 mg     Discontinue     550 mg Oral 2 times daily 01/01/20 1038     01/01/20 0115  rifaximin (XIFAXAN) tablet 200 mg  Status:  Discontinued        200 mg Oral 2 times daily 01/01/20 0105 01/01/20 1038      Subjective: Afebrile currently, no chest pain, no nausea, no vomiting.  Patient reports no dysuria.  He is oriented x3 at this time following commands appropriately and feeling better.  No further episodes of hypoglycemia appreciated.  Stable vital signs.  Objective: Vitals:   01/04/20 2101 01/05/20 0428 01/05/20 0835 01/05/20 1504  BP: (!) 138/53 (!) 114/45 (!) 123/53 (!) 128/57  Pulse: 83 73 73 74  Resp: 20 20  20   Temp: 99.1 F (37.3 C) 98.5 F (36.9 C)  98.4 F (36.9 C)  TempSrc: Oral Oral    SpO2: 96% 98%  93%  Weight:      Height:        Intake/Output Summary (Last 24 hours) at 01/05/2020 1607 Last data filed at 01/05/2020 0631 Gross per 24 hour  Intake 730 ml  Output --  Net 730 ml   Filed Weights   01/03/20 0500 01/03/20 0950 01/03/20 1325  Weight: 102.9 kg 102.6 kg 99 kg    Examination: General exam: Alert, awake, oriented x 3; per sister at bedside mentation is at baseline currently.  No chest pain, no nausea, no  vomiting.  Patient is afebrile.  Still with signs of fluid overload and complaining of shortness of breath with activity. Respiratory system: No wheezing, decreased breath sounds at the bases; no frank crackles. Cardiovascular system:RRR. No murmurs, rubs, gallops. Gastrointestinal system: Abdomen is distended, soft and nontender. No organomegaly or masses felt. Normal bowel sounds heard.   Increased abdominal girth on examination. Central nervous system: Alert and oriented. No focal neurological deficits. Extremities: No cyanosis or clubbing; trace to 1+ edema bilaterally. Skin: No petechiae; multiple bruises from blood draws appreciated. Psychiatry: Mood and affect appropriate.   Data Reviewed: I have personally reviewed following labs and imaging studies  CBC: Recent Labs  Lab 12/31/19 1909 01/01/20 0424 01/02/20 0347 01/03/20 0540  WBC 4.0 4.1 2.9* 4.1  NEUTROABS 3.3  --   --   --   HGB 8.3* 8.3* 7.7* 7.8*  HCT 27.7* 27.2* 26.0* 25.6*  MCV 96.5 96.1 97.0 97.0  PLT 123* 121* 116* 662*   Basic Metabolic Panel: Recent Labs  Lab 12/31/19 1909 01/01/20 0424 01/02/20 0347 01/03/20 0540  NA 123* 126* 130* 130*  K 5.7* 5.7* 4.5 4.5  CL 93* 93* 95* 93*  CO2 18* 22 24 23   GLUCOSE 102* 81 45* 89  BUN 44* 46* 25* 34*  CREATININE 4.73* 4.81* 3.13* 3.96*  CALCIUM 8.5* 8.6* 8.5* 9.0  MG  --  1.8  --  1.7  PHOS  --  7.2*  --   --    GFR: Estimated Creatinine Clearance: 18.9 mL/min (A) (by C-G formula based on SCr of 3.96 mg/dL (H)).   Liver Function Tests: Recent Labs  Lab 12/31/19 1909 01/01/20 0424 01/02/20 0347 01/03/20 0540  AST 29 26 31 27   ALT 25 24 26 24   ALKPHOS 174* 163* 165* 171*  BILITOT 0.7 0.8 0.7 0.9  PROT 7.1 6.9 7.2 7.3  ALBUMIN 3.1* 3.0* 3.0* 3.0*   Recent Labs  Lab 12/31/19 1909  LIPASE 23   Recent Labs  Lab 12/31/19 1909 01/01/20 0424 01/02/20 0347 01/03/20 0540  AMMONIA 86* 66* 57* 21   Coagulation Profile: Recent Labs  Lab 01/01/20 0424  INR 1.2   CBG: Recent Labs  Lab 01/04/20 2057 01/05/20 0042 01/05/20 0426 01/05/20 0734 01/05/20 1147  GLUCAP 190* 132* 107* 106* 179*   Sepsis Labs: Recent Labs  Lab 01/02/20 0347  LATICACIDVEN 1.2    Recent Results (from the past 240 hour(s))  SARS Coronavirus 2 by RT PCR (hospital order, performed in [facility] hospital lab) Nasopharyngeal Nasopharyngeal Swab      Status: None   Collection Time: 12/31/19  8:59 PM   Specimen: Nasopharyngeal Swab  Result Value Ref Range Status   SARS Coronavirus 2 NEGATIVE NEGATIVE Final    Comment: (NOTE) SARS-CoV-2 target nucleic acids are NOT DETECTED.  The SARS-CoV-2 RNA is generally detectable in upper and lower respiratory specimens during the acute phase of infection. The lowest concentration of SARS-CoV-2 viral copies this assay can detect is 250 copies / mL. A negative result does not preclude SARS-CoV-2 infection and should not be used as the sole basis for treatment or other patient management decisions.  A negative result may occur with improper specimen collection / handling, submission of specimen other than nasopharyngeal swab, presence of viral mutation(s) within the areas targeted by this assay, and inadequate number of viral copies (<250 copies / mL). A negative result must be combined with clinical observations, patient history, and epidemiological information.  Fact Sheet for Patients:  StrictlyIdeas.no  Fact Sheet for Healthcare Providers: BankingDealers.co.za  This test is not yet approved or  cleared by the [place] FDA and has been authorized for detection and/or diagnosis of SARS-CoV-2 by FDA under an Emergency Use Authorization (EUA).  This EUA will remain in effect (meaning this test can be used) for the duration of the COVID-19 declaration under Section 564(b)(1) of the Act, 21 U.S.C. section 360bbb-3(b)(1), unless the authorization is terminated or revoked sooner.  Performed at [facility], [address] [place], [place] [address]   Gram stain     Status: None   Collection Time: 01/01/20 11:40 AM   Specimen: Fluid  Result Value Ref Range Status   Specimen Description FLUID LEFT THORACENTESIS COLLECTED BY DOCTOR  Final   Special Requests NONE  Final   Gram Stain   Final    CYTOSPIN SMEAR WBC PRESENT, PREDOMINANTLY  MONONUCLEAR NO ORGANISMS SEEN Performed at [facility] Performed at [facility], [address], [place], [place] [address]    Report Status 01/01/2020 FINAL  Final  Acid Fast Smear (AFB)     Status: None   Collection Time: 01/01/20  1:20 PM   Specimen: Ascitic; Body Fluid  Result Value Ref Range Status   AFB Specimen Processing Concentration  Final   Acid Fast Smear Negative  Final    Comment: (NOTE) Performed At: [facility] [place], [place] [id number] Rush Farmer MD BW:[id number]    Source (AFB) NONE  Corrected    Comment: Performed at [facility], [address] [place], [place] [address] CORRECTED ON 08/02 AT 1420: PREVIOUSLY REPORTED AS ASCITIC   Culture, body fluid-bottle     Status: None (Preliminary result)   Collection Time: 01/01/20  1:20 PM   Specimen: Ascitic  Result Value Ref Range Status   Specimen Description ASCITIC  Final   Special Requests BOTTLES DRAWN AEROBIC AND ANAEROBIC 10CC EACH  Final   Culture   Final    NO GROWTH 4 DAYS Performed at [facility], [address] [place], [place] [address]    Report Status PENDING  Incomplete  Gram stain     Status: None   Collection Time: 01/01/20  1:20 PM   Specimen: Fluid  Result Value Ref Range Status   Specimen Description FLUID ASCITIC COLLECTED BY DOCTOR  Final   Special Requests NONE  Final   Gram Stain   Final    NO ORGANISMS SEEN WBC SEEN WBC PRESENT,BOTH PMN AND MONONUCLEAR CYTOSPIN SMEAR PERFORMED AT [facility] Performed at [facility], [address] [place], [place] [address]    Report Status 01/01/2020 FINAL  Final  MRSA PCR Screening     Status: None   Collection Time: 01/01/20  1:51 PM   Specimen: Nasal Mucosa; Nasopharyngeal  Result Value Ref Range Status   MRSA by PCR NEGATIVE NEGATIVE Final    Comment:        The GeneXpert MRSA Assay (FDA approved for NASAL specimens only), is one component of a comprehensive MRSA colonization surveillance program. It is  not intended to diagnose MRSA infection nor to guide or monitor treatment for MRSA infections. Performed at [facility], [address], [place], [place] [address]   Urine Culture     Status: Abnormal   Collection Time: 01/04/20 12:59 PM   Specimen: Urine, Catheterized  Result Value Ref Range Status   Specimen Description   Final    URINE, CATHETERIZED Performed at [facility], [address]  [place], [place] [address]    Special Requests   Final    NONE Performed at [facility], [address], [place], [place] [address]    Culture MULTIPLE SPECIES PRESENT, SUGGEST RECOLLECTION (A)  Final   Report Status 01/05/2020 FINAL  Final      Radiology Studies: No results found.  Scheduled Meds: . furosemide  40 mg Oral BID  . insulin aspart  0-6 Units Subcutaneous TID WC  . iron polysaccharides  150 mg Oral QHS  . lactulose  30 g Oral QID  . metoprolol succinate  50 mg Oral BID  . nystatin   Topical TID  . pantoprazole  40 mg Oral QAC breakfast  . pravastatin  20 mg Oral QPM  . rifaximin  550 mg Oral BID  . spironolactone  50 mg Oral q AM  . tamsulosin  0.4 mg Oral QPM  . thiamine  100 mg Oral q AM  . venlafaxine  75 mg Oral TID WC  . vitamin B-12  1,000 mcg Oral Daily    LOS: 5 days    Time spent: 30 minutes   Barton Dubois, MD [facility]  If 7PM-7AM, please contact night-coverage [email or web address] 01/05/2020, 4:07 PM

## 2020-01-05 NOTE — Progress Notes (Signed)
Nephrology Follow-Up Consult Note [facility]   Assessment/Recommendations: Jeffrey [name] is a/an 74 y.o. male with a past medical history significant for ESRD, ESLD, alcohol use disorder, DM 2 admitted for abdominal pain.      ESRD: MWF at [facility]. S/p HD on 8/4 tolerated well. Plan for scheduled dialysis today Volume/HTN: Improving. Continue home Lasix, metoprolol, spironolactone. Will continue to aim for net uf of 3.5L as tolerated with HD today ESLD/alcohol use disorder: Gastroenterology and palliative care following. Guarded prognosis. Appreciate assistance.  Primary team increasing lactulose given confusion Hyponatremia: Related to free water retention.  Managing with dialysis. Poor prognostic indicator in cirrhosis Failure to thrive/hypoglycemia: Primary team managing Diabetes mellitus type 2 uncontrolled with recent hypoglycemia: Management per primary team. Resolved Abdominal pain: No signs of SBP on paracentesis   Recommendations conveyed to primary service. Patient will not be seen over the weekend, call with specific questions.   [facility] 01/05/2020 8:23 AM  ___________________________________________________________  CC: ESRD  Interval History/Subjective:  No acute events. Now more awake and interactive. He is not sure when he last received dialysis (thinks it was yesterday). No complaints as of right now. Denies any fevers, chest pain, shortness of breath, issues with his access.   Medications:  Current Facility-Administered Medications  Medication Dose Route Frequency Provider Last Rate Last Admin  . acetaminophen (TYLENOL) tablet 650 mg  650 mg Oral Q6H PRN Lang Snow, FNP   650 mg at 01/04/20 4235   Or  . acetaminophen (TYLENOL) suppository 650 mg  650 mg Rectal Q4H PRN Lang Snow, FNP      . furosemide (LASIX) tablet 40 mg  40 mg Oral BID Manuella Ghazi, Pratik D, DO   40 mg at 01/04/20 1728  .  HYDROcodone-acetaminophen (NORCO/VICODIN) 5-325 MG per tablet 1 tablet  1 tablet Oral Q6H PRN Manuella Ghazi, Pratik D, DO   1 tablet at 01/03/20 1730  . insulin aspart (novoLOG) injection 0-6 Units  0-6 Units Subcutaneous TID WC Barton Dubois, MD   1 Units at 01/04/20 1727  . iron polysaccharides (NIFEREX) capsule 150 mg  150 mg Oral QHS Shah, Pratik D, DO   150 mg at 01/04/20 2155  . labetalol (NORMODYNE) injection 10 mg  10 mg Intravenous Q2H PRN Manuella Ghazi, Pratik D, DO      . lactulose (CHRONULAC) 10 GM/15ML solution 30 g  30 g Oral QID Annitta Needs, NP   30 g at 01/05/20 0609  . metoprolol succinate (TOPROL-XL) 24 hr tablet 50 mg  50 mg Oral BID Heath Lark D, DO   50 mg at 01/04/20 2155  . nystatin (MYCOSTATIN/NYSTOP) topical powder   Topical TID Lang Snow, FNP   Given at 01/04/20 2154  . pantoprazole (PROTONIX) EC tablet 40 mg  40 mg Oral QAC breakfast Erenest Rasher, PA-C   40 mg at 01/04/20 3614  . pravastatin (PRAVACHOL) tablet 20 mg  20 mg Oral QPM Shah, Pratik D, DO   20 mg at 01/04/20 1728  . rifaximin (XIFAXAN) tablet 550 mg  550 mg Oral BID Heath Lark D, DO   550 mg at 01/04/20 2154  . spironolactone (ALDACTONE) tablet 50 mg  50 mg Oral q AM Manuella Ghazi, Pratik D, DO   50 mg at 01/05/20 0631  . tamsulosin (FLOMAX) capsule 0.4 mg  0.4 mg Oral QPM Shah, Pratik D, DO   0.4 mg at 01/04/20 1728  . thiamine tablet 100 mg  100 mg Oral q AM Manuella Ghazi,  Pratik D, DO   100 mg at 01/05/20 0630  . venlafaxine (EFFEXOR) tablet 75 mg  75 mg Oral TID WC Shah, Pratik D, DO   75 mg at 01/04/20 1707  . vitamin B-12 (CYANOCOBALAMIN) tablet 1,000 mcg  1,000 mcg Oral Daily Manuella Ghazi, Pratik D, DO   1,000 mcg at 01/04/20 1031      Review of Systems: 10 systems reviewed and negative except per interval history/subjective  Physical Exam: Vitals:   01/04/20 2101 01/05/20 0428  BP: (!) 138/53 (!) 114/45  Pulse: 83 73  Resp: 20 20  Temp: 99.1 F (37.3 C) 98.5 F (36.9 C)  SpO2: 96% 98%   No intake/output data  recorded.  Intake/Output Summary (Last 24 hours) at 01/05/2020 1610 Last data filed at 01/05/2020 0631 Gross per 24 hour  Intake 730 ml  Output --  Net 730 ml   Constitutional: Chronically ill-appearing, sitting up in chair, no acute distress ENMT: ears and nose without scars or lesions, MMM, icteric sclera CV: normal rate, 1+ pitting edema in the bilateral lower extremities Respiratory: Bilateral chest rise, normal work of breathing, clear to auscultation bilaterally Gastrointestinal: soft, non-tender Skin: no visible lesions or rashes chronic stasis changes bilateral lower extremities MSK: LUE avf with +b/t, ecchymosis surrounding avf Psych: alert, poor insight and judgment, appropriate mood Neuro: Speech clear and coherent, moves all extremity spontaneously   Test Results I personally reviewed new and old clinical labs and radiology tests Lab Results  Component Value Date   NA 130 (L) 01/03/2020   K 4.5 01/03/2020   CL 93 (L) 01/03/2020   CO2 23 01/03/2020   BUN 34 (H) 01/03/2020   CREATININE 3.96 (H) 01/03/2020   CALCIUM 9.0 01/03/2020   ALBUMIN 3.0 (L) 01/03/2020   PHOS 7.2 (H) 01/01/2020

## 2020-01-05 NOTE — Progress Notes (Signed)
Subjective: Much more aware today. AAO x 3. Denies abdominal pain, N/V, hematochezia, melena. Having a bowel movement (in the bedside commode) when I was talking with him. Has had a few a day. States he will follow his instructions at home and take his medications. No other GI complaints.  Objective: Vital signs in last 24 hours: Temp:  [98.5 F (36.9 C)-99.1 F (37.3 C)] 98.5 F (36.9 C) (08/06 0428) Pulse Rate:  [73-83] 73 (08/06 0835) Resp:  [20] 20 (08/06 0428) BP: (114-138)/(45-58) 123/53 (08/06 0835) SpO2:  [96 %-98 %] 98 % (08/06 0428) Last BM Date: 01/04/20 General:   Alert and oriented, pleasant Head:  Normocephalic and atraumatic. Eyes:  No icterus, sclera clear. Conjuctiva pink.  Heart:  S1, S2 present, no murmurs noted.  Lungs: Clear to auscultation bilaterally, without wheezing, rales, or rhonchi.  Abdomen:  Bowel sounds present, soft, non-tender, non-distended. No HSM or hernias noted. No rebound or guarding. Msk:  Symmetrical without gross deformities. Pulses:  Normal bilateral DP pulses noted. Extremities:  Without clubbing or edema. Neurologic:  Alert and  oriented x4;  grossly normal neurologically. Psych:  Alert and cooperative. Normal mood and affect.  Intake/Output from previous day: 08/05 0701 - 08/06 0700 In: 730 [P.O.:480] Out: -  Intake/Output this shift: No intake/output data recorded.  Lab Results: Recent Labs    01/03/20 0540  WBC 4.1  HGB 7.8*  HCT 25.6*  PLT 117*   BMET Recent Labs    01/03/20 0540  NA 130*  K 4.5  CL 93*  CO2 23  GLUCOSE 89  BUN 34*  CREATININE 3.96*  CALCIUM 9.0   LFT Recent Labs    01/03/20 0540  PROT 7.3  ALBUMIN 3.0*  AST 27  ALT 24  ALKPHOS 171*  BILITOT 0.9   PT/INR No results for input(s): LABPROT, INR in the last 72 hours. Hepatitis Panel No results for input(s): HEPBSAG, HCVAB, HEPAIGM, HEPBIGM in the last 72 hours.   Studies/Results: No results found.  Assessment: 74 year old  male with history of cirrhosis due to prior alcohol use (meld sodium 29, accounting for dialysis on admission), end-stage renal disease on dialysis, transfusion dependent anemia status post extensive evaluation (EGD X 2, colonoscopy, capsule endoscopy), SBP last year at outside hospital but unable to be on prophylaxis due to history of recurrent Cdiff, known varices but unable to take non-selective beta blocker therapy, encephalopathy,presenting this admission with abdominal pain and acute encephalopathy. Found to have pleural effusions, s/p thoracentesis. Negative cell count for SBP on diagnostic para on admission.  Encephalopathy- he has had compliance issues at home with medications. Appears fully awake today, seems back to baseline. Ammonia level improved a couple days (85->66->57->21). Has had 3 BMs in the last 24 hours, having a loose stool currently. Continue with lactulose and xifaxan.  Anemia- Chronic transfusion dependent.  No overt GI bleeding.  Continue Protonix daily. Transfuse as needed.   End-stage renal disease on hemodialysis-  Diuretic management per nephrology.  Fever- Fever earlier this morning of 101.4 yesterday morning; low-grade yesterday (99.1); afebrile this morning (98.5). Attending aware. U/A with large leucs, WBC; rare bacteria. Culture pending. Previously no signs of SBP on diagnostic tap, cultures remain negative.   Plan: 1. Continue lactulose at current dose 2. Continue Xifaxan 3. Monitor for any obvious GI bleed 4. Follow for urine C&S results 5. Monitor for any recurrent fever 6. Supportive measures   Thank you for allowing [place] to participate in the care of [place]  T Ephriam Jenkins, DNP, AGNP-C Adult & Gerontological Nurse Practitioner [facility]    LOS: 5 days    01/05/2020, 10:02 AM

## 2020-01-05 NOTE — Evaluation (Signed)
Physical Therapy Evaluation Patient Details Name: Jeffrey [name] MRN: [id number] DOB: 13-Oct-1945 Today's Date: 01/05/2020   History of Present Illness  Jeffrey [name] is a 74 y.o. male with medical history significant for alcoholic liver cirrhosis, ESRD (MWF), hypertension, diabetes mellitus type 2, hyperlipidemia, transfusion dependent anemia, portal hypertensive gastropathy, esophageal varices who presents to the ED due to 2 week onset of intermittent right upper and lower abdominal pain which became worse in the past 4 days. Patient was unable to provide history possibly due to being lethargic (though easily arousable). He only kept repeating that the pain was 7/10 for all questions related to the abdominal pain. History was obtained from the ED physician and ED medical  Record. Per report, Pain was sharp in nature and he denied increased abdominal swelling beyond his baseline. He was seen by his PCP (Dr Manuella Ghazi) 2 days ago who asked him to go to the ED for further evaluation but he was hoping that the pain will subside and today, pain worsened and wife noted that patient was more confused today, so he presented to the ED  Clinical Impression  PT is alert and oriented.  PT has decreased balance and activity tolerance but states that he normally walks in his home only and does not ambulate in the community.  Pt may benefit from [facility] to ensure safety of home and ensure pt is able to get to the car as pt does have dialysis.     Follow Up Recommendations Home health PT;Supervision for mobility/OOB    Equipment Recommendations  None recommended by PT;Other (comment) (PT should use rolling walker, patient reported already having one at home)    Recommendations for Other Services   none    Precautions / Restrictions   none     Mobility  Bed Mobility Overal bed mobility: Needs Assistance;Modified Independent       Supine to sit: Modified independent (Device/Increase time)        Transfers Overall  transfer level: Modified independent Equipment used: Rolling walker (2 wheeled) Transfers: Sit to/from Stand Sit to Stand: Min guard            Ambulation/Gait Ambulation/Gait assistance: Supervision Gait Distance (Feet): 70 Feet Assistive device: Rolling walker (2 wheeled) Gait Pattern/deviations: Decreased step length - left;Decreased step length - right        Stairs            Wheelchair Mobility    Modified Rankin (Stroke Patients Only)             Pertinent Vitals/Pain Pain Assessment: No/denies pain    Home Living Family/patient expects to be discharged to:: Private residence Living Arrangements: Spouse/significant other Available Help at Discharge: Family Type of Home: House Home Access: Ramped entrance     Home Layout: Able to live on main level with bedroom/bathroom Home Equipment: Walker - 2 wheels;Walker - 4 wheels      Prior Function Level of Independence: Independent with assistive device(s)         Comments: Patient reported household community ambulation     Hand Dominance        Extremity/Trunk Assessment   Upper Extremity Assessment Upper Extremity Assessment: Generalized weakness    Lower Extremity Assessment Lower Extremity Assessment: Generalized weakness    Cervical / Trunk Assessment Cervical / Trunk Assessment: Normal  Communication   Communication: No difficulties  Cognition Arousal/Alertness: Awake/alert Behavior During Therapy: WFL for tasks assessed/performed Overall Cognitive Status: Within Functional Limits for tasks assessed  Assessment/Plan    PT Assessment Patient needs continued PT services  PT Problem List Decreased strength;Decreased mobility;Decreased activity tolerance;Decreased balance       PT Treatment Interventions DME instruction;Gait training;Functional mobility training;Therapeutic activities;Therapeutic  exercise;Balance training;Neuromuscular re-education;Patient/family education    PT Goals (Current goals can be found in the Care Plan section)  Acute Rehab PT Goals Patient Stated Goal: To return home with family PT Goal Formulation: With patient Time For Goal Achievement: 01/06/20 Potential to Achieve Goals: Good    Frequency Min 2X/week   Barriers to discharge    none    Co-evaluation               AM-PAC PT "6 Clicks" Mobility  Outcome Measure Help needed turning from your back to your side while in a flat bed without using bedrails?: None Help needed moving from lying on your back to sitting on the side of a flat bed without using bedrails?: A Little Help needed moving to and from a bed to a chair (including a wheelchair)?: None Help needed standing up from a chair using your arms (e.g., wheelchair or bedside chair)?: None Help needed to walk in hospital room?: None Help needed climbing 3-5 steps with a railing? : A Lot 6 Click Score: 21    End of Session Equipment Utilized During Treatment: Gait belt Activity Tolerance: Patient tolerated treatment well;Patient limited by fatigue Patient left: with call bell/phone within reach;Other (comment) (on commode) Nurse Communication: Mobility status PT Visit Diagnosis: Unsteadiness on feet (R26.81);Other abnormalities of gait and mobility (R26.89);Muscle weakness (generalized) (M62.81)    Time: 6226-3335 PT Time Calculation (min) (ACUTE ONLY): 37 min   Charges:   PT Evaluation $PT Eval Low Complexity: [place], PT CLT [phone] 01/05/2020, 4:58 PM

## 2020-01-06 DIAGNOSIS — Z9889 Other specified postprocedural states: Secondary | ICD-10-CM

## 2020-01-06 DIAGNOSIS — J9611 Chronic respiratory failure with hypoxia: Secondary | ICD-10-CM

## 2020-01-06 DIAGNOSIS — I5032 Chronic diastolic (congestive) heart failure: Secondary | ICD-10-CM

## 2020-01-06 LAB — CBC
HCT: 22.6 % — ABNORMAL LOW (ref 39.0–52.0)
Hemoglobin: 7 g/dL — ABNORMAL LOW (ref 13.0–17.0)
MCH: 28.8 pg (ref 26.0–34.0)
MCHC: 31 g/dL (ref 30.0–36.0)
MCV: 93 fL (ref 80.0–100.0)
Platelets: 104 10*3/uL — ABNORMAL LOW (ref 150–400)
RBC: 2.43 MIL/uL — ABNORMAL LOW (ref 4.22–5.81)
RDW: 16.6 % — ABNORMAL HIGH (ref 11.5–15.5)
WBC: 2.1 10*3/uL — ABNORMAL LOW (ref 4.0–10.5)
nRBC: 0 % (ref 0.0–0.2)

## 2020-01-06 LAB — RENAL FUNCTION PANEL
Albumin: 2.4 g/dL — ABNORMAL LOW (ref 3.5–5.0)
Anion gap: 11 (ref 5–15)
BUN: 24 mg/dL — ABNORMAL HIGH (ref 8–23)
CO2: 26 mmol/L (ref 22–32)
Calcium: 8.3 mg/dL — ABNORMAL LOW (ref 8.9–10.3)
Chloride: 94 mmol/L — ABNORMAL LOW (ref 98–111)
Creatinine, Ser: 2.73 mg/dL — ABNORMAL HIGH (ref 0.61–1.24)
GFR calc Af Amer: 26 mL/min — ABNORMAL LOW (ref >60)
GFR calc non Af Amer: 22 mL/min — ABNORMAL LOW (ref >60)
Glucose, Bld: 148 mg/dL — ABNORMAL HIGH (ref 70–99)
Phosphorus: 3.4 mg/dL (ref 2.5–4.6)
Potassium: 3.1 mmol/L — ABNORMAL LOW (ref 3.5–5.1)
Sodium: 131 mmol/L — ABNORMAL LOW (ref 135–145)

## 2020-01-06 LAB — GLUCOSE, CAPILLARY
Glucose-Capillary: 96 mg/dL (ref 70–99)
Glucose-Capillary: 166 mg/dL — ABNORMAL HIGH (ref 70–99)
Glucose-Capillary: 135 mg/dL — ABNORMAL HIGH (ref 70–99)
Glucose-Capillary: 258 mg/dL — ABNORMAL HIGH (ref 70–99)
Glucose-Capillary: 223 mg/dL — ABNORMAL HIGH (ref 70–99)

## 2020-01-06 LAB — CULTURE, BODY FLUID W GRAM STAIN -BOTTLE: Culture: NO GROWTH

## 2020-01-06 MED ORDER — METOPROLOL SUCCINATE ER 25 MG PO TB24: 25.0000 mg | ORAL_TABLET | Freq: Every day | ORAL | Status: DC

## 2020-01-06 MED ORDER — TRESIBA FLEXTOUCH 200 UNIT/ML ~~LOC~~ SOPN: 30.0000 [IU] | PEN_INJECTOR | Freq: Every day | SUBCUTANEOUS | Status: DC

## 2020-01-06 MED ORDER — NYSTATIN 100000 UNIT/GM EX POWD: Freq: Three times a day (TID) | CUTANEOUS | 0 refills | Status: DC

## 2020-01-06 MED ORDER — LACTULOSE 10 GM/15ML PO SOLN: 30.0000 g | Freq: Three times a day (TID) | ORAL | 1 refills | Status: DC

## 2020-01-06 NOTE — Discharge Summary (Signed)
Physician Discharge Summary  Jeffrey [name] LGX:[id number] DOB: 07-Feb-1946 DOA: 12/31/2019  PCP: Monico Blitz, MD  Admit date: 12/31/2019 Discharge date: 01/06/2020  Time spent: 35 minutes  Recommendations for Outpatient Follow-up:  Repeat CBC to follow hemoglobin trend Repeat renal function panel to follow electrolytes stability   Discharge Diagnoses:  Principal Problem:   Acute hepatic encephalopathy Active Problems:   Alcoholic cirrhosis of liver with ascites ([place])   Type 2 diabetes mellitus (HCC)   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HCC)   Hyperkalemia   Hyponatremia   Pleural effusion   Hypoglycemia   Hyperammonemia (HCC)   Obesity (BMI 30-39.9)   Hypoalbuminemia   Hepatic encephalopathy (HCC)   Status post thoracentesis Chronic diastolic heart failure. Chronic respiratory failure with hypoxia.   Discharge Condition: Stable and improved.  Discharged home with home health services and instruction to follow-up with PCP in 10 days.  Patient will follow up with gastroenterologist (office will set up appointment). Next hemodialysis treatment on 01/08/2020.  Code status: DNR/DNI   Diet recommendation: Renal diet.  Filed Weights   01/03/20 1325 01/05/20 2125 01/06/20 0550  Weight: 99 kg 97.8 kg 96 kg    History of present illness:  Per HPI: Jeffrey [name] a 74 y.o.malewith medical history significant foralcoholic liver cirrhosis, ESRD(MWF), hypertension, diabetes mellitus type 2, hyperlipidemia, transfusion dependent anemia, portal hypertensive gastropathy, esophageal variceswho presents to the ED due to 2 week onset of intermittent right upper and lower abdominal pain which became worse in the past 4 days. Patient was unable to provide history possibly due to being lethargic (though easily arousable). He only kept repeating that the pain was 7/10 for all questions related to the abdominal pain. History was obtained from the ED physician and ED medical Record. Per report, Pain  was sharp in nature and he denied increased abdominal swelling beyond his baseline. He was seen by his PCP (Dr Manuella Ghazi) 2 days ago who asked him to go to the ED for further evaluation but he was hoping that the pain will subside and today, pain worsened and wife noted that patient was more confused today, so he presented to the ED. There was no report of fever, chills, chest pain, SOB, N/V/D.Of note, patient was reported to have missed his dialysis on Friday, last dialysis was on Wednesday (12/27/2019).  Hospital Course:  Acute hepatic encephalopathy in the setting of hyperammonemia and hypoglycemia-persistent -Ammonia level 86>66>57>>21 after adjusted dose of lactulose -not compliant with lactulose at home. -will also continue rifaximin -Appreciate GI recommendations and will follow suggestions. -continue PPI -paracentesis on 01/01/20, no signs of SBP -poor prognosis, not a transplant candidate; appreciate involvement of palliative care. -At discharge DNR/DNI; continue to treat what is treatable. -Oriented x3 and looking to be compliant with his medications and hemodialysis as an outpatient.  Moderate left and right pleural effusion -CT abdomen pelvis without contrast showed moderate left and small right pleural effusion with basilar airspace disease greatest on the left. -Underwent left-sided thoracentesis on 8/2 -2D echocardiogram ordered 8/2 with LVEF 70-75% and no other acute findings. -breathing has improved some after thoracentesis and paracentesis; still with signs of fluid overload, but better.   ESRD on HD (MWF) -Patient missed dialysis on Friday prior to admission. -will follow renal service recommendations. -plan for next outpatient HD 01/08/20  Hypoglycemia-intermittent in setting of type 2 diabetes -Patient presents with blood glucose in the high 20s, and IV D50 x1 and D10 infusion was given. -advise to follow CBG's closely and  significantly reduce insulin dose recommended only  if CBG's elevated.  -A1c 5.9 -Cortisol within normal limits.  Hyponatremia-improved -Possibly due to poor solute intake and missing hemodialysis prior to admission. -Improved after hemodialysis  -next hemodialysis as an outpatient on 01/08/20  -Continue to follow electrolytes trend. -advise to maintain adequate oral intake  Hyperkalemia -corrected with HD -continue to follow electrolytes trend.  History of alcoholic cirrhosis of liver with ascites -CT abdomen pelvis without contrast showed cirrhotic liver with signs of portal hypertension -No further alcohol use, confirmed with wife -Underwent paracentesis 8/2 with 233mL; fluid analysis no suggesting SBP. -continue lactulose, b-blocker and PPI.  Hypoalbuminemia -Alb 3.0,possibly related to patient's history of cirrhosis. -advised to follow use of protein supplementations. (like nepro)  Transfusion dependent anemia -Currently downtrending Hgb, but has not reached need for transfusion. -With fluid overload signs, hemodilution effect also playing a role -continue Iron and epogen as per renal discretion  -No overt bleeding noted -follow Hgb and further transfuse as needed  Class II obesity  -low calorie diet and portion controlled has been discussed with patient. -Body mass index is 33.15 kg/m.  DNR -long discussion with wife about her husband status and guarded condition; she understand how sick he is and expressed poor quality of life prior to acute decompensation. She has expressed that in the event of requiring invasive intervention or artificial support, she will like her husband to be kept comfortable and not to exposed him to that. -patient code status has been changed to DNR/DNI base on that discussion. -continue to treat the treatable.   Fever: -Patient denies dysuria; urinalysis and culture demonstrated no acute infection. -Antibiotic has been held at this time -Patient afebrile for 48 hours prior to  discharge.  Physical deconditioning: -Patient oriented, awake and able to follow commands currently. -PT evaluation and recommendations appreciated; home health PT services has been arranged at discharge. -Patient ready uses a walker at home.  Chronic respiratory failure -Secondary to diastolic heart failure -Continue oxygen supplementation -2 L through nasal cannula at baseline -Good O2 sat at discharge. -Continue watching his amount of sodium and follow volume adjustment with hemodialysis.  Chronic diastolic heart failure -Continue to follow daily weights -Continue the use of current diuretics/beta-blocker -Follow volume management through hemodialysis.  Procedures:  See below for x-ray reports  Left-sided thoracentesis and paracentesis performed on 01/01/2020.  Consultations:  Nephrology  GI  Palliative care  Discharge Exam: Vitals:   01/06/20 0911 01/06/20 1505  BP: (!) 114/49 (!) 123/91  Pulse: 83 89  Resp:  18  Temp:  98.4 F (36.9 C)  SpO2:  100%    General: Afebrile, no chest pain, no nausea, no vomiting.  Patient following commands appropriately oriented x3 and feeling ready to go home.  Denies shortness of breath.  Good oxygen saturation on his chronic 2 L supplementation. Cardiovascular: Rate controlled, no rubs, no gallops, no JVD on exam (difficult to examine in the setting of large body habitus). Respiratory: Improved air movement bilaterally, no wheezing, no frank crackles.  Positive scattered rhonchi. Abdomen: Mildly distended, no tenderness on palpation, positive bowel sounds bilaterally.  Very little fluid wave appreciated on exam. Extremities: No cyanosis, no clubbing, trace edema appreciated bilaterally.  Discharge Instructions   Discharge Instructions    (HEART FAILURE PATIENTS) Call MD:  Anytime you have any of the following symptoms: 1) 3 pound weight gain in 24 hours or 5 pounds in 1 week 2) shortness of breath, with or without a dry  hacking  cough 3) swelling in the hands, feet or stomach 4) if you have to sleep on extra pillows at night in order to breathe.   Complete by: As directed    Diet - low sodium heart healthy   Complete by: As directed    Diet Carb Modified   Complete by: As directed    Discharge instructions   Complete by: As directed    Take medications as prescribed 1 day medical hydration Do not missed any dialysis treatment or any dose of your lactulose prescriptions. Follow low sodium diet Close monitoring on your blood sugar and follow instructions on adjusted dosage.     Allergies as of 01/06/2020      Reactions   Chlorhexidine    Tape Itching, Rash      Medication List    STOP taking these medications   ciprofloxacin 500 MG tablet Commonly known as: CIPRO   hydrOXYzine 25 MG capsule Commonly known as: VISTARIL   mirtazapine 15 MG tablet Commonly known as: REMERON   tiZANidine 2 MG tablet Commonly known as: ZANAFLEX     TAKE these medications   furosemide 40 MG tablet Commonly known as: LASIX Take 40 mg by mouth 2 (two) times daily.   gabapentin 100 MG capsule Commonly known as: NEURONTIN TAKE 1 CAPSULE BY MOUTH AT BEDTIME   HumaLOG KwikPen 100 UNIT/ML KwikPen Generic drug: insulin lispro as needed. Sliding scale   HYDROcodone-acetaminophen 10-325 MG tablet Commonly known as: NORCO Take 1 tablet by mouth every 6 (six) hours as needed for moderate pain. What changed:   when to take this  Another medication with the same name was removed. Continue taking this medication, and follow the directions you see here.   iron polysaccharides 150 MG capsule Commonly known as: NIFEREX Take 150 mg by mouth at bedtime.   lactulose 10 GM/15ML solution Commonly known as: CHRONULAC Take 45 mLs (30 g total) by mouth 3 (three) times daily. What changed: when to take this   metoprolol succinate 25 MG 24 hr tablet Commonly known as: TOPROL-XL Take 1 tablet (25 mg total) by mouth  daily. Take at bedtime  on Monday ,Wednesday and Friday due to dialaylisis What changed: Another medication with the same name was removed. Continue taking this medication, and follow the directions you see here.   milk thistle 175 MG tablet Take 175 mg by mouth 3 (three) times daily.   multivitamin with minerals Tabs tablet Take 1 tablet by mouth in the morning.   nystatin powder Commonly known as: MYCOSTATIN/NYSTOP Apply topically 3 (three) times daily. Into groin area and affected area   omega-3 acid ethyl esters 1 g capsule Commonly known as: LOVAZA Take 2 g by mouth 2 (two) times daily.   omeprazole 40 MG capsule Commonly known as: PRILOSEC Take 40 mg by mouth in the morning.   pravastatin 20 MG tablet Commonly known as: PRAVACHOL Take 20 mg by mouth in the morning.   PROBIOTIC DAILY PO Take 1 tablet by mouth in the morning.   rifaximin 550 MG Tabs tablet Commonly known as: Xifaxan Take 1 tablet (550 mg total) by mouth 2 (two) times daily.   spironolactone 50 MG tablet Commonly known as: ALDACTONE Take 50 mg by mouth in the morning.   tamsulosin 0.4 MG Caps capsule Commonly known as: FLOMAX Take 0.4 mg by mouth every evening.   thiamine 100 MG tablet Take 100 mg by mouth in the morning. Vitamin B-1   Tresiba FlexTouch 200 UNIT/ML  FlexTouch Pen Generic drug: insulin degludec Inject 30 Units into the skin at bedtime. When blood sugars are up What changed: how much to take   triamcinolone cream 0.1 % Commonly known as: KENALOG Apply 1 application topically 2 (two) times daily as needed (dry skin Itching).   venlafaxine 75 MG tablet Commonly known as: EFFEXOR Take 75 mg by mouth 3 (three) times daily with meals.   vitamin B-12 1000 MCG tablet Commonly known as: CYANOCOBALAMIN Take 1,000 mcg by mouth daily.   vitamin C 1000 MG tablet Take 1,000 mg by mouth 2 (two) times daily.      Allergies  Allergen Reactions  . Chlorhexidine   . Tape Itching and  Rash    Follow-up Information    [facility] Follow up.   Specialty: Home Health Services Why: HHPT Contact information: [address] [place] [address] [phone]        Monico Blitz, MD. Schedule an appointment as soon as possible for a visit in 10 day(s).   Specialty: Internal Medicine Contact information: [place] [address] [phone]               The results of significant diagnostics from this hospitalization (including imaging, microbiology, ancillary and laboratory) are listed below for reference.    Significant Diagnostic Studies: CT ABDOMEN PELVIS W WO CONTRAST  Result Date: 01/01/2020 CLINICAL DATA:  Cirrhosis, abdominal pain,, 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 CONTRAST:  136mL OMNIPAQUE IOHEXOL 300 MG/ML  SOLN COMPARISON:  CT abdomen pelvis, 12/31/2019 FINDINGS: Lower chest: Unchanged moderate left, small right pleural effusions and associated atelectasis or consolidation. Coronary artery calcifications. Hepatobiliary: Cirrhotic morphology of the liver. There is unchanged, irregular subcapsular hypodensity of the anterior dome of the liver (series 12, image 119) without suspicious contrast enhancement or focal lesion, this appearance not changed compared to prior examinations dating back to 01/10/2016. Gallstones in the contracted gallbladder. No biliary ductal dilatation. Pancreas: Unremarkable. No pancreatic ductal dilatation or surrounding inflammatory changes. Spleen: Splenomegaly, maximum coronal span 17.0 cm. Adrenals/Urinary Tract: Adrenal glands are unremarkable. Kidneys are normal, without renal calculi, solid lesion, or hydronephrosis. Bladder is decompressed by Foley  catheter. Stomach/Bowel: Stomach is within normal limits. Appendix is not clearly visualized. No evidence of bowel wall thickening, distention, or inflammatory changes. Occasional sigmoid diverticula. Vascular/Lymphatic: Aortic atherosclerosis. There is some eccentric calcification of the central portions of the superior mesenteric vein near the confluence with the portal vein; there is no significant intraluminal thrombus (series 12, image 58). No enlarged abdominal or pelvic lymph nodes. Reproductive: No mass or other significant abnormality. Other: No abdominal wall hernia or abnormality. Small volume ascites in the abdomen and pelvis, unchanged compared to prior examination. Musculoskeletal: No acute or significant osseous findings. IMPRESSION: 1. Cirrhotic morphology of the liver. There is unchanged, irregular subcapsular hypodensity of the anterior dome of the liver without suspicious contrast enhancement or focal lesion, this appearance not changed compared to prior examinations dating back to 01/10/2016 and perhaps related to scarring or fibrosis. 2. There is some eccentric calcification of the central portions of the superior mesenteric vein near the confluence with the portal vein; there is no significant intraluminal thrombus. 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 3. Splenomegaly. 4.  Cholelithiasis. 5. Small volume ascites in the abdomen and pelvis, unchanged compared to prior examination. 6. Unchanged moderate left, small right pleural effusions and associated atelectasis or consolidation. 7. Coronary artery disease.  Aortic Atherosclerosis (ICD10-I70.0). Electronically Signed   By: Eddie Candle M.D.   On: 01/01/2020 11:22   CT ABDOMEN PELVIS WO CONTRAST  Result Date: 12/31/2019 CLINICAL DATA:  Abdominal distension, abdominal pain for 2 weeks. EXAM: CT ABDOMEN AND PELVIS WITHOUT CONTRAST TECHNIQUE: Multidetector CT imaging of the abdomen and pelvis was  performed following the standard protocol without IV contrast. COMPARISON:  January 10, 2016 FINDINGS: Lower chest: Moderate LEFT and small RIGHT pleural effusion with basilar airspace disease also greatest on the LEFT. Central venous access device terminates at the caval to atrial junction incompletely imaged. No pericardial fluid. Hepatobiliary: Heterogeneity of the RIGHT hepatic lobe showing linear and branching appearance grossly similar to the study of January 10, 2016. Frankly cirrhotic liver. Cholelithiasis. Gallbladder is collapsed. There is generalized stranding throughout the abdomen nonspecific to the gallbladder. Focal low attenuation in the RIGHT hemi liver on image 15 of series 2 2.5 x 3.4 cm. Pancreas: Pancreas is normal without ductal dilation, gross inflammation or contour change from previous imaging study. Spleen: Spleen remains enlarged. Signs of portosystemic collaterals in the upper abdomen. This includes splenorenal shunting. Adrenals/Urinary Tract: Adrenal glands grossly normal though LEFT adrenal is partially obscured by retroperitoneal venous collaterals. No hydronephrosis. No perinephric stranding. Urinary bladder collapsed with Foley catheter in situ. No nephrolithiasis. No ureteral calculi. Stomach/Bowel: Stool throughout the colon. Generalized edema in the abdominal mesenteries. Small volume ascites mainly in the LEFT hemiabdomen. No perienteric or pericolonic stranding. Signs of colonic diverticulosis, mild. Vascular/Lymphatic: Calcified atheromatous plaque in the abdominal aorta. No aneurysmal dilation. No adenopathy in the retroperitoneum. No adenopathy in the pelvis. 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 Reproductive: Prostate grossly normal by CT. Other: Body wall edema and skin thickening over a lower abdominal pannus. Skin thickening was present previously. Interval abdominal wall  reconstruction and repair of umbilical hernia since the prior study. Generalized body wall edema. Musculoskeletal: No acute musculoskeletal process. Spinal degenerative changes. IMPRESSION: 1. Moderate LEFT and small RIGHT pleural effusion with basilar airspace disease also greatest on the LEFT. Airspace disease likely volume loss associated with effusion. 2. Calcification of the periphery of the SMV and portal venous system can be seen in the setting of chronic thrombosis. CT or multiphase MRI may be helpful as clinically warranted. 3. Cirrhotic liver with signs of portal hypertension. Generalized edema could be also related to liver disease. Focal area of low attenuation in the RIGHT hemi liver may represent confluent fibrosis the given severity of cirrhotic changes however, would suggest follow-up with multiphase CT or MRI as dictated by the patient's renal function. 4. Small volume ascites mainly in the LEFT hemiabdomen. The location of the ascites raising the question of some loculation. No peripheral rind is suggested on noncontrast imaging. Correlate with any clinical symptoms of or history of SBP. 5. Cholelithiasis with collapsed gallbladder and with generalized stranding in the abdomen in the setting of portal hypertension. 6. Interval abdominal wall reconstruction and repair of umbilical hernia since the prior study. 7. Body wall edema and skin thickening over a lower abdominal pannus. Skin thickening was present previously. Correlate with any clinical evidence of cellulitis. 8. Aortic atherosclerosis. These results were called by telephone at the time of interpretation on 12/31/2019 at 7:44  pm to provider JULIE IDOL , who verbally acknowledged these results. Aortic Atherosclerosis (ICD10-I70.0). Electronically Signed   By: Zetta Bills M.D.   On: 12/31/2019 19:38   DG Chest 1 View  Result Date: 01/01/2020 CLINICAL DATA:  LEFT pleural effusion post thoracentesis EXAM: CHEST  1 VIEW COMPARISON:  Portable  exam 1138 hours compared to 09/18/2019 FINDINGS: Dual lumen RIGHT jugular catheter with tip projecting over cavoatrial junction. Enlargement of cardiac silhouette with pulmonary vascular congestion. Bibasilar atelectasis. Lungs otherwise clear. No gross pleural effusion or pneumothorax. IMPRESSION: No pneumothorax following LEFT thoracentesis. Electronically Signed   By: Lavonia Dana M.D.   On: 01/01/2020 13:51   CT Head Wo Contrast  Result Date: 12/13/2019 CLINICAL DATA:  Altered mental status. EXAM: CT HEAD WITHOUT CONTRAST TECHNIQUE: Contiguous axial images were obtained from the base of the skull through the vertex without intravenous contrast. COMPARISON:  September 08, 2019. FINDINGS: Brain: Mild chronic ischemic white matter disease is noted. No mass effect or midline shift is noted. Ventricular size is within normal limits. There is no evidence of mass lesion, hemorrhage or acute infarction. Vascular: No hyperdense vessel or unexpected calcification. Skull: Normal. Negative for fracture or focal lesion. Sinuses/Orbits: No acute finding. Other: None. IMPRESSION: Mild chronic ischemic white matter disease. No acute intracranial abnormality seen. Electronically Signed   By: Marijo Conception M.D.   On: 12/13/2019 13:07   US Paracentesis  Result Date: 01/01/2020 INDICATION: Alcoholic cirrhosis, end-stage renal disease on dialysis, ascites, question spontaneous bacterial peritonitis EXAM: ULTRASOUND GUIDED DIAGNOSTIC PARACENTESIS MEDICATIONS: None COMPLICATIONS: None immediate PROCEDURE: Informed written consent was obtained from the patient after a discussion of the risks, benefits and alternatives to treatment. A timeout was performed prior to the initiation of the procedure. Initial ultrasound scanning demonstrates a large amount of ascites within the LEFT lower abdominal quadrant. The right lower abdomen was prepped and draped in the usual sterile fashion. 1% lidocaine was used for local anesthesia. Following  this, a 5 [place] Yueh catheter was introduced. An ultrasound image was saved for documentation purposes. The paracentesis was performed. The catheter was removed and a dressing was applied. The patient tolerated the procedure well without immediate post procedural complication. FINDINGS: A total of approximately 240 mL of yellow ascitic fluid was removed. Samples were sent to the laboratory as requested by the clinical team. IMPRESSION: Successful ultrasound-guided paracentesis yielding 240 mL of peritoneal fluid. Electronically Signed   By: Lavonia Dana M.D.   On: 01/01/2020 14:13   ECHOCARDIOGRAM COMPLETE  Result Date: 01/01/2020    ECHOCARDIOGRAM REPORT   Patient Name:   NISSAN FRAZZINI Date of Exam: 01/01/2020 Medical Rec #:  [id number]    Height:       67.0 in Accession #:    [id number]   Weight:       238.5 lb Date of Birth:  16-Mar-1946     BSA:          2.180 m Patient Age:    75 years     BP:           163/70 mmHg Patient Gender: M            HR:           89 bpm. Exam Location:  [name] Procedure: 2D Echo Indications:    Congestive Heart Failure 428.0 / I50.9  History:        Patient has no prior history of Echocardiogram examinations.  Risk Factors:Diabetes and Former Smoker. Alcoholic cirrhosis ,                 ESRD.  Sonographer:    Leavy Cella RDCS (AE) Referring Phys: [phone] Royanne Foots [place]  1. Left ventricular ejection fraction, by estimation, is 70 to 75%. The left ventricle has normal function. The left ventricle has no regional wall motion abnormalities. Left ventricular diastolic parameters were normal.  2. Right ventricular systolic function was not well visualized. The right ventricular size is not well visualized.  3. Left atrial size was mildly dilated.  4. The mitral valve is normal in structure. No evidence of mitral valve regurgitation. No evidence of mitral stenosis.  5. The aortic valve has an indeterminant number of cusps. Aortic valve regurgitation is not  visualized. Mild aortic valve stenosis.  6. The inferior vena cava is normal in size with greater than 50% respiratory variability, suggesting right atrial pressure of 3 mmHg. FINDINGS  Left Ventricle: Left ventricular ejection fraction, by estimation, is 70 to 75%. The left ventricle has normal function. The left ventricle has no regional wall motion abnormalities. The left ventricular internal cavity size was normal in size. There is  no left ventricular hypertrophy. Left ventricular diastolic parameters were normal. Right Ventricle: RV not well visualized, grossly appears normal in size and function. The right ventricular size is not well visualized. Right vetricular wall thickness was not assessed. Right ventricular systolic function was not well visualized. Left Atrium: Left atrial size was mildly dilated. Right Atrium: Right atrial size was normal in size. Pericardium: There is no evidence of pericardial effusion. Mitral Valve: The mitral valve is normal in structure. No evidence of mitral valve regurgitation. No evidence of mitral valve stenosis. Tricuspid Valve: The tricuspid valve is normal in structure. Tricuspid valve regurgitation is not demonstrated. No evidence of tricuspid stenosis. Aortic Valve: The aortic valve has an indeterminant number of cusps. . There is mild thickening and mild calcification of the aortic valve. Aortic valve regurgitation is not visualized. Mild aortic stenosis is present. Mild aortic valve annular calcification. There is mild thickening of the aortic valve. There is mild calcification of the aortic valve. Aortic valve mean gradient measures 9.3 mmHg. Aortic valve peak gradient measures 16.8 mmHg. Aortic valve area, by VTI measures 1.72 cm. Pulmonic Valve: The pulmonic valve was not well visualized. Pulmonic valve regurgitation is not visualized. No evidence of pulmonic stenosis. Aorta: The aortic root is normal in size and structure. Pulmonary Artery: Indeterminant PASP,  inadequate TR jet. Venous: The inferior vena cava is normal in size with greater than 50% respiratory variability, suggesting right atrial pressure of 3 mmHg. IAS/Shunts: No atrial level shunt detected by color flow Doppler.  LEFT VENTRICLE PLAX 2D LVIDd:         4.52 cm  Diastology LVIDs:         2.60 cm  LV e' lateral:   12.00 cm/s LV PW:         1.00 cm  LV E/e' lateral: 9.2 LV IVS:        1.01 cm  LV e' medial:    7.72 cm/s LVOT diam:     2.10 cm  LV E/e' medial:  14.2 LV SV:         75 LV SV Index:   35 LVOT Area:     3.46 cm  RIGHT VENTRICLE RV S prime:     13.30 cm/s TAPSE (M-mode): 2.1 cm LEFT ATRIUM  Index       RIGHT ATRIUM           Index LA diam:        4.70 cm 2.16 cm/m  RA Area:     23.30 cm LA Vol (A2C):   67.6 ml 31.02 ml/m RA Volume:   79.60 ml  36.52 ml/m LA Vol (A4C):   72.3 ml 33.17 ml/m LA Biplane Vol: 70.4 ml 32.30 ml/m  AORTIC VALVE AV Area (Vmax):    1.69 cm AV Area (Vmean):   1.63 cm AV Area (VTI):     1.72 cm AV Vmax:           204.67 cm/s AV Vmean:          138.333 cm/s AV VTI:            0.438 m AV Peak Grad:      16.8 mmHg AV Mean Grad:      9.3 mmHg LVOT Vmax:         99.70 cm/s LVOT Vmean:        65.033 cm/s LVOT VTI:          0.217 m LVOT/AV VTI ratio: 0.50  AORTA Ao Root diam: 3.20 cm MITRAL VALVE MV Area (PHT): 3.56 cm     SHUNTS MV Decel Time: 213 msec     Systemic VTI:  0.22 m MV E velocity: 110.00 cm/s  Systemic Diam: 2.10 cm MV A velocity: 54.80 cm/s MV E/A ratio:  2.01 Carlyle Dolly MD Electronically signed by Carlyle Dolly MD Signature Date/Time: 01/01/2020/6:48:57 PM    Final    US THORACENTESIS ASP PLEURAL SPACE W/IMG GUIDE  Result Date: 01/01/2020 INDICATION: Pleural effusion, history alcoholic cirrhosis, atrial fibrillation, end-stage renal disease on dialysis, diabetes mellitus, hypertension EXAM: ULTRASOUND GUIDED DIAGNOSTIC LEFT THORACENTESIS MEDICATIONS: None. COMPLICATIONS: None immediate. PROCEDURE: An ultrasound guided thoracentesis was  thoroughly discussed with the patient and questions answered. The benefits, risks, alternatives and complications were also discussed. The patient understands and wishes to proceed with the procedure. Written consent was obtained. Ultrasound was performed to localize and mark an adequate pocket of fluid in the LEFT chest. The area was then prepped and draped in the normal sterile fashion. 1% Lidocaine was used for local anesthesia. Under ultrasound guidance a 5 [place] Yueh catheter was introduced. Thoracentesis was performed. The catheter was removed and a dressing applied. FINDINGS: A total of approximately 240 mL of dark yellow LEFT pleural fluid was removed. Samples were sent to the laboratory as requested by the clinical team. IMPRESSION: Successful ultrasound guided diagnostic LEFT thoracentesis yielding 240 mL of pleural fluid. Electronically Signed   By: Lavonia Dana M.D.   On: 01/01/2020 13:50    Microbiology: Recent Results (from the past 240 hour(s))  SARS Coronavirus 2 by RT PCR (hospital order, performed in [facility] hospital lab) Nasopharyngeal Nasopharyngeal Swab     Status: None   Collection Time: 12/31/19  8:59 PM   Specimen: Nasopharyngeal Swab  Result Value Ref Range Status   SARS Coronavirus 2 NEGATIVE NEGATIVE Final    Comment: (NOTE) SARS-CoV-2 target nucleic acids are NOT DETECTED.  The SARS-CoV-2 RNA is generally detectable in upper and lower respiratory specimens during the acute phase of infection. The lowest concentration of SARS-CoV-2 viral copies this assay can detect is 250 copies / mL. A negative result does not preclude SARS-CoV-2 infection and should not be used as the sole basis for treatment or other patient management decisions.  A negative  result may occur with improper specimen collection / handling, submission of specimen other than nasopharyngeal swab, presence of viral mutation(s) within the areas targeted by this assay, and inadequate number of viral  copies (<250 copies / mL). A negative result must be combined with clinical observations, patient history, and epidemiological information.  Fact Sheet for Patients:   StrictlyIdeas.no  Fact Sheet for Healthcare Providers: BankingDealers.co.za  This test is not yet approved or  cleared by the [place] FDA and has been authorized for detection and/or diagnosis of SARS-CoV-2 by FDA under an Emergency Use Authorization (EUA).  This EUA will remain in effect (meaning this test can be used) for the duration of the COVID-19 declaration under Section 564(b)(1) of the Act, 21 U.S.C. section 360bbb-3(b)(1), unless the authorization is terminated or revoked sooner.  Performed at [facility], [address], [place], [place] [address]   Gram stain     Status: None   Collection Time: 01/01/20 11:40 AM   Specimen: Fluid  Result Value Ref Range Status   Specimen Description FLUID LEFT THORACENTESIS COLLECTED BY DOCTOR  Final   Special Requests NONE  Final   Gram Stain   Final    CYTOSPIN SMEAR WBC PRESENT, PREDOMINANTLY MONONUCLEAR NO ORGANISMS SEEN Performed at [facility] Performed at [facility], [address] [place], [place] [address]    Report Status 01/01/2020 FINAL  Final  Acid Fast Smear (AFB)     Status: None   Collection Time: 01/01/20  1:20 PM   Specimen: Ascitic; Body Fluid  Result Value Ref Range Status   AFB Specimen Processing Concentration  Final   Acid Fast Smear Negative  Final    Comment: (NOTE) Performed At: [facility] [place], [place] [id number] Rush Farmer MD FU:[id number]    Source (AFB) NONE  Corrected    Comment: Performed at [facility], [address], [place], [place] [address] CORRECTED ON 08/02 AT 1420: PREVIOUSLY REPORTED AS ASCITIC   Culture, body fluid-bottle     Status: None   Collection Time: 01/01/20  1:20 PM   Specimen: Ascitic  Result Value Ref Range  Status   Specimen Description ASCITIC  Final   Special Requests BOTTLES DRAWN AEROBIC AND ANAEROBIC 10CC EACH  Final   Culture   Final    NO GROWTH 5 DAYS Performed at [facility], [address] [place], [place] [address]    Report Status 01/06/2020 FINAL  Final  Gram stain     Status: None   Collection Time: 01/01/20  1:20 PM   Specimen: Fluid  Result Value Ref Range Status   Specimen Description FLUID ASCITIC COLLECTED BY DOCTOR  Final   Special Requests NONE  Final   Gram Stain   Final    NO ORGANISMS SEEN WBC SEEN WBC PRESENT,BOTH PMN AND MONONUCLEAR CYTOSPIN SMEAR PERFORMED AT [facility] Performed at [facility], [address], [place], [place] [address]    Report Status 01/01/2020 FINAL  Final  MRSA PCR Screening     Status: None   Collection Time: 01/01/20  1:51 PM   Specimen: Nasal Mucosa; Nasopharyngeal  Result Value Ref Range Status   MRSA by PCR NEGATIVE NEGATIVE Final    Comment:        The GeneXpert MRSA Assay (FDA approved for NASAL specimens only), is one component of a comprehensive MRSA colonization surveillance program. It is not intended to diagnose MRSA infection nor to guide or monitor treatment for MRSA infections. Performed at  [facility], [address], [place], [place] [address]   Urine Culture     Status: Abnormal   Collection Time: 01/04/20 12:59 PM   Specimen: Urine, Catheterized  Result Value Ref Range Status   Specimen Description   Final    URINE, CATHETERIZED Performed at [facility], [address] [place], [place] [address]    Special Requests   Final    NONE Performed at [facility], [address], [place], [place] [address]    Culture MULTIPLE SPECIES PRESENT, SUGGEST RECOLLECTION (A)  Final   Report Status 01/05/2020 FINAL  Final     Labs: Basic Metabolic Panel: Recent Labs  Lab 12/31/19 1909 01/01/20 0424 01/02/20 0347 01/03/20 0540 01/06/20 0637  NA 123* 126* 130* 130* 131*  K 5.7* 5.7* 4.5 4.5 3.1*  CL 93* 93*  95* 93* 94*  CO2 18* 22 24 23 26   GLUCOSE 102* 81 45* 89 148*  BUN 44* 46* 25* 34* 24*  CREATININE 4.73* 4.81* 3.13* 3.96* 2.73*  CALCIUM 8.5* 8.6* 8.5* 9.0 8.3*  MG  --  1.8  --  1.7  --   PHOS  --  7.2*  --   --  3.4   Liver Function Tests: Recent Labs  Lab 12/31/19 1909 01/01/20 0424 01/02/20 0347 01/03/20 0540 01/06/20 0637  AST 29 26 31 27   --   ALT 25 24 26 24   --   ALKPHOS 174* 163* 165* 171*  --   BILITOT 0.7 0.8 0.7 0.9  --   PROT 7.1 6.9 7.2 7.3  --   ALBUMIN 3.1* 3.0* 3.0* 3.0* 2.4*   Recent Labs  Lab 12/31/19 1909  LIPASE 23   Recent Labs  Lab 12/31/19 1909 01/01/20 0424 01/02/20 0347 01/03/20 0540  AMMONIA 86* 66* 57* 21   CBC: Recent Labs  Lab 12/31/19 1909 01/01/20 0424 01/02/20 0347 01/03/20 0540 01/06/20 0637  WBC 4.0 4.1 2.9* 4.1 2.1*  NEUTROABS 3.3  --   --   --   --   HGB 8.3* 8.3* 7.7* 7.8* 7.0*  HCT 27.7* 27.2* 26.0* 25.6* 22.6*  MCV 96.5 96.1 97.0 97.0 93.0  PLT 123* 121* 116* 117* 104*   CBG: Recent Labs  Lab 01/05/20 2024 01/06/20 0235 01/06/20 0453 01/06/20 0756 01/06/20 1155  GLUCAP 156* 96 166* 135* 258*    Signed:  Barton Dubois MD.  [facility] 01/06/2020, 4:13 PM

## 2020-01-06 NOTE — TOC Transition Note (Signed)
Transition of Care [facility]) - CM/SW Discharge Note   Patient Details  Name: STARR ENGEL MRN: [id number] Date of Birth: 1946-05-07  [facility]) CM/SW Contact:  Natasha Bence, LCSW Phone Number: 01/06/2020, 1:00 PM   Clinical Narrative:    CSW contacted patient's spouse to inquire about whether the family would be agreeable to Home Health services. Family agreeable to Home Health services. CSW contacted [facility] with [facility]. [facility] agreeable to provide HHPT for patient. TOC signing off.          Patient Goals and CMS Choice        Discharge Placement                       Discharge Plan and Services                                     Social Determinants of Health (SDOH) Interventions     Readmission Risk Interventions No flowsheet data found.

## 2020-01-06 NOTE — Progress Notes (Signed)
Nsg Discharge Note  Admit Date:  12/31/2019 Discharge date: 01/06/2020   Jeffrey [name] to be D/C'd Home per MD order.  AVS completed.  Copy for chart, and copy for patient signed, and dated. Patient/caregiver able to verbalize understanding. IV removed catheter intact. No sign of discomfort noted.   Discharge Medication: Allergies as of 01/06/2020      Reactions   Chlorhexidine    Tape Itching, Rash      Medication List    STOP taking these medications   ciprofloxacin 500 MG tablet Commonly known as: CIPRO   hydrOXYzine 25 MG capsule Commonly known as: VISTARIL   mirtazapine 15 MG tablet Commonly known as: REMERON   tiZANidine 2 MG tablet Commonly known as: ZANAFLEX     TAKE these medications   furosemide 40 MG tablet Commonly known as: LASIX Take 40 mg by mouth 2 (two) times daily.   gabapentin 100 MG capsule Commonly known as: NEURONTIN TAKE 1 CAPSULE BY MOUTH AT BEDTIME   HumaLOG KwikPen 100 UNIT/ML KwikPen Generic drug: insulin lispro as needed. Sliding scale   HYDROcodone-acetaminophen 10-325 MG tablet Commonly known as: NORCO Take 1 tablet by mouth every 6 (six) hours as needed for moderate pain. What changed:   when to take this  Another medication with the same name was removed. Continue taking this medication, and follow the directions you see here.   iron polysaccharides 150 MG capsule Commonly known as: NIFEREX Take 150 mg by mouth at bedtime.   lactulose 10 GM/15ML solution Commonly known as: CHRONULAC Take 45 mLs (30 g total) by mouth 3 (three) times daily. What changed: when to take this   metoprolol succinate 25 MG 24 hr tablet Commonly known as: TOPROL-XL Take 1 tablet (25 mg total) by mouth daily. Take at bedtime  on Monday ,Wednesday and Friday due to dialaylisis What changed: Another medication with the same name was removed. Continue taking this medication, and follow the directions you see here.   milk thistle 175 MG tablet Take 175 mg  by mouth 3 (three) times daily.   multivitamin with minerals Tabs tablet Take 1 tablet by mouth in the morning.   nystatin powder Commonly known as: MYCOSTATIN/NYSTOP Apply topically 3 (three) times daily. Into groin area and affected area   omega-3 acid ethyl esters 1 g capsule Commonly known as: LOVAZA Take 2 g by mouth 2 (two) times daily.   omeprazole 40 MG capsule Commonly known as: PRILOSEC Take 40 mg by mouth in the morning.   pravastatin 20 MG tablet Commonly known as: PRAVACHOL Take 20 mg by mouth in the morning.   PROBIOTIC DAILY PO Take 1 tablet by mouth in the morning.   rifaximin 550 MG Tabs tablet Commonly known as: Xifaxan Take 1 tablet (550 mg total) by mouth 2 (two) times daily.   spironolactone 50 MG tablet Commonly known as: ALDACTONE Take 50 mg by mouth in the morning.   tamsulosin 0.4 MG Caps capsule Commonly known as: FLOMAX Take 0.4 mg by mouth every evening.   thiamine 100 MG tablet Take 100 mg by mouth in the morning. Vitamin B-1   Tresiba FlexTouch 200 UNIT/ML FlexTouch Pen Generic drug: insulin degludec Inject 30 Units into the skin at bedtime. When blood sugars are up What changed: how much to take   triamcinolone cream 0.1 % Commonly known as: KENALOG Apply 1 application topically 2 (two) times daily as needed (dry skin Itching).   venlafaxine 75 MG tablet Commonly known as:  EFFEXOR Take 75 mg by mouth 3 (three) times daily with meals.   vitamin B-12 1000 MCG tablet Commonly known as: CYANOCOBALAMIN Take 1,000 mcg by mouth daily.   vitamin C 1000 MG tablet Take 1,000 mg by mouth 2 (two) times daily.       Discharge Assessment: Vitals:   01/06/20 0911 01/06/20 1505  BP: (!) 114/49 (!) 123/91  Pulse: 83 89  Resp:  18  Temp:  98.4 F (36.9 C)  SpO2:  100%   Skin clean, dry and intact without evidence of skin break down, no evidence of skin tears noted. IV catheter discontinued intact. Site without signs and symptoms of  complications - no redness or edema noted at insertion site, patient denies c/o pain - only slight tenderness at site.  Dressing with slight pressure applied.  D/c Instructions-Education: Discharge instructions given to patient/family with verbalized understanding. D/c education completed with patient/family including follow up instructions, medication list, d/c activities limitations if indicated, with other d/c instructions as indicated by MD - patient able to verbalize understanding, all questions fully answered. Patient instructed to return to ED, call 911, or call MD for any changes in condition.  Patient escorted via [place], and D/C home via private auto.  Zenaida Deed, RN 01/06/2020 5:22 PM

## 2020-01-07 NOTE — TOC Transition Note (Signed)
Transition of Care [facility]) - CM/SW Discharge Note   Patient Details  Name: ZORAN YANKEE MRN: [id number] Date of Birth: 20-Dec-1945  [facility]) CM/SW Contact:  Natasha Bence, LCSW Phone Number: 01/07/2020, 11:49 AM   Clinical Narrative:    CSW received notification from [facility] reporting that they were not able to take patient. CSW placed referral with [place]. [facility] agreeable to take patient. TOC signing off.   Final next level of care: [place] Barriers to Discharge: Barriers Resolved   Patient Goals and CMS Choice        Discharge Placement                       Discharge Plan and Services                          HH Arranged: PT [place] Agency: [facility] Date [place]: 01/06/20 Time [place]: 1100 Representative spoke with at [place]: [place] ([place]) Interventions     Readmission Risk Interventions No flowsheet data found.

## 2020-01-08 ENCOUNTER — Other Ambulatory Visit: Payer: Self-pay | Admitting: *Deleted

## 2020-01-08 DIAGNOSIS — Z992 Dependence on renal dialysis: Secondary | ICD-10-CM | POA: Diagnosis not present

## 2020-01-08 DIAGNOSIS — N2581 Secondary hyperparathyroidism of renal origin: Secondary | ICD-10-CM | POA: Diagnosis not present

## 2020-01-08 DIAGNOSIS — N186 End stage renal disease: Secondary | ICD-10-CM | POA: Diagnosis not present

## 2020-01-08 DIAGNOSIS — E878 Other disorders of electrolyte and fluid balance, not elsewhere classified: Secondary | ICD-10-CM | POA: Diagnosis not present

## 2020-01-09 ENCOUNTER — Other Ambulatory Visit (HOSPITAL_COMMUNITY): Payer: Self-pay | Admitting: Medical

## 2020-01-09 ENCOUNTER — Other Ambulatory Visit: Payer: Self-pay | Admitting: *Deleted

## 2020-01-09 ENCOUNTER — Encounter: Payer: Self-pay | Admitting: *Deleted

## 2020-01-09 DIAGNOSIS — E785 Hyperlipidemia, unspecified: Secondary | ICD-10-CM | POA: Diagnosis not present

## 2020-01-09 DIAGNOSIS — I8500 Esophageal varices without bleeding: Secondary | ICD-10-CM | POA: Diagnosis not present

## 2020-01-09 DIAGNOSIS — E1169 Type 2 diabetes mellitus with other specified complication: Secondary | ICD-10-CM | POA: Diagnosis not present

## 2020-01-09 DIAGNOSIS — I5032 Chronic diastolic (congestive) heart failure: Secondary | ICD-10-CM | POA: Diagnosis not present

## 2020-01-09 DIAGNOSIS — D631 Anemia in chronic kidney disease: Secondary | ICD-10-CM | POA: Diagnosis not present

## 2020-01-09 DIAGNOSIS — I132 Hypertensive heart and chronic kidney disease with heart failure and with stage 5 chronic kidney disease, or end stage renal disease: Secondary | ICD-10-CM | POA: Diagnosis not present

## 2020-01-09 DIAGNOSIS — N186 End stage renal disease: Secondary | ICD-10-CM | POA: Diagnosis not present

## 2020-01-09 DIAGNOSIS — J9611 Chronic respiratory failure with hypoxia: Secondary | ICD-10-CM | POA: Diagnosis not present

## 2020-01-09 DIAGNOSIS — K766 Portal hypertension: Secondary | ICD-10-CM | POA: Diagnosis not present

## 2020-01-09 DIAGNOSIS — E11649 Type 2 diabetes mellitus with hypoglycemia without coma: Secondary | ICD-10-CM | POA: Diagnosis not present

## 2020-01-09 DIAGNOSIS — E1122 Type 2 diabetes mellitus with diabetic chronic kidney disease: Secondary | ICD-10-CM | POA: Diagnosis not present

## 2020-01-09 NOTE — Patient Outreach (Signed)
[facility]) Care Management  01/09/2020  Jeffrey [name] 1945/06/11 [id number]   Referral received 8/9 Initial Outreach 8/10  Telephone Assessment-Enrolled Diabetes  RN spoke with pt today and introduced [facility] services and the purpose for today's call. Pt receptive. Discussed his recent discharge and ongoing medical issues related. Pt requested to focus on his diabetes. Discussed diabetes and how pt can better manage this condition based upon healthy eating habits and slight changes to hiis activities if tolerated. Pt has a lot of support from his spouse Jeffrey [name] who works part-time to assist with the bills. Pt provided permission to speak with her at anytime if needed for additional information. Verified pt has sufficient transportation and has all discharged medications and taking as recommended. Initial assessment obtained and RN case manager discussed and generated a plan of care to improve pt's ongoing management of care related to his diabetes. A1c pt believes is around 6 with his AM read at 133. Pt has  Free style and takes his glucose readings several times throughout the day. Pt continue weekly diaylsis M-W-F. Pt also has home O2 on 2 liters and able to manage his portable units when outside the home. Reports falls history however using his assistive devices to prevent future falls (WC, rolling walker and a cane).  Discussed dietary foods and how to reduce his carbohydrate with increase in his proteins. Educated no the Sick rule related to diabetes and review all medications pt's adherence related. Offered to end printable emmi related to diabetic management (receptive).   Plan: Will notify pt's primary provider of pt's disposition with [facility] and send to appropiate letters to the pt for Welcome and Successful outreach. No additional issues to address at this time. Will follow up in 2 weeks with ongoing case management services along with any needed resources.  Goals Addressed               This Visit's Progress   .  manage my diabetes better (pt-stated)        CARE PLAN ENTRY (see longtitudinal plan of care for additional care plan information)  Objective:  Lab Results  Component Value Date   HGBA1C 5.9 (H) 12/16/2019 .   Lab Results  Component Value Date   CREATININE 2.73 (H) 01/06/2020   CREATININE 3.96 (H) 01/03/2020   CREATININE 3.13 (H) 01/02/2020 .   [name] No results found for: EGFR  Current Barriers:  [name] Knowledge Deficits related to basic Diabetes pathophysiology and self care/management . Knowledge Deficits related to medications used for management of diabetes  Case Manager Clinical Goal(s):  Over the next 90 days, patient will demonstrate improved adherence to prescribed treatment plan for diabetes self care/management as evidenced by:  [name] Verbalize daily monitoring and recording of CBG within 30 days . Verbalize adherence to ADA/ carb modified diet within the next 30 days  Interventions:  . Provided education to patient about basic DM disease process . Reviewed medications with patient and discussed importance of medication adherence . Discussed plans with patient for ongoing care management follow up and provided patient with direct contact information for care management team . Provided patient with written educational materials related to hypo and hyperglycemia and importance of correct treatment . Reviewed scheduled/upcoming provider appointments including: all medications reviewed.  Patient Self Care Activities:  . Self administers oral medications as prescribed . Self administers insulin as prescribed . Attends all scheduled provider appointments . Checks blood sugars as prescribed and utilize hyper and hypoglycemia protocol  as needed . Adheres to prescribed ADA/carb modified  Initial goal documentation        Jeffrey Mina, RN Care Management Coordinator [place] Office [phone]

## 2020-01-10 DIAGNOSIS — Z992 Dependence on renal dialysis: Secondary | ICD-10-CM | POA: Diagnosis not present

## 2020-01-10 DIAGNOSIS — N186 End stage renal disease: Secondary | ICD-10-CM | POA: Diagnosis not present

## 2020-01-11 ENCOUNTER — Ambulatory Visit: Payer: Self-pay | Admitting: *Deleted

## 2020-01-11 DIAGNOSIS — E1169 Type 2 diabetes mellitus with other specified complication: Secondary | ICD-10-CM | POA: Diagnosis not present

## 2020-01-11 DIAGNOSIS — J9611 Chronic respiratory failure with hypoxia: Secondary | ICD-10-CM | POA: Diagnosis not present

## 2020-01-11 DIAGNOSIS — I5032 Chronic diastolic (congestive) heart failure: Secondary | ICD-10-CM | POA: Diagnosis not present

## 2020-01-11 DIAGNOSIS — D631 Anemia in chronic kidney disease: Secondary | ICD-10-CM | POA: Diagnosis not present

## 2020-01-11 DIAGNOSIS — I132 Hypertensive heart and chronic kidney disease with heart failure and with stage 5 chronic kidney disease, or end stage renal disease: Secondary | ICD-10-CM | POA: Diagnosis not present

## 2020-01-11 DIAGNOSIS — K766 Portal hypertension: Secondary | ICD-10-CM | POA: Diagnosis not present

## 2020-01-11 DIAGNOSIS — I8500 Esophageal varices without bleeding: Secondary | ICD-10-CM | POA: Diagnosis not present

## 2020-01-11 DIAGNOSIS — E1122 Type 2 diabetes mellitus with diabetic chronic kidney disease: Secondary | ICD-10-CM | POA: Diagnosis not present

## 2020-01-11 DIAGNOSIS — E11649 Type 2 diabetes mellitus with hypoglycemia without coma: Secondary | ICD-10-CM | POA: Diagnosis not present

## 2020-01-11 DIAGNOSIS — E785 Hyperlipidemia, unspecified: Secondary | ICD-10-CM | POA: Diagnosis not present

## 2020-01-11 DIAGNOSIS — N186 End stage renal disease: Secondary | ICD-10-CM | POA: Diagnosis not present

## 2020-01-12 ENCOUNTER — Other Ambulatory Visit (HOSPITAL_COMMUNITY): Payer: Medicare Other

## 2020-01-12 DIAGNOSIS — Z992 Dependence on renal dialysis: Secondary | ICD-10-CM | POA: Diagnosis not present

## 2020-01-12 DIAGNOSIS — N186 End stage renal disease: Secondary | ICD-10-CM | POA: Diagnosis not present

## 2020-01-15 DIAGNOSIS — Z992 Dependence on renal dialysis: Secondary | ICD-10-CM | POA: Diagnosis not present

## 2020-01-15 DIAGNOSIS — N186 End stage renal disease: Secondary | ICD-10-CM | POA: Diagnosis not present

## 2020-01-15 DIAGNOSIS — E878 Other disorders of electrolyte and fluid balance, not elsewhere classified: Secondary | ICD-10-CM | POA: Diagnosis not present

## 2020-01-16 ENCOUNTER — Ambulatory Visit: Payer: Medicare Other

## 2020-01-16 ENCOUNTER — Ambulatory Visit (HOSPITAL_COMMUNITY): Payer: Medicare Other | Admitting: Hematology

## 2020-01-16 DIAGNOSIS — I10 Essential (primary) hypertension: Secondary | ICD-10-CM | POA: Diagnosis not present

## 2020-01-16 DIAGNOSIS — I132 Hypertensive heart and chronic kidney disease with heart failure and with stage 5 chronic kidney disease, or end stage renal disease: Secondary | ICD-10-CM | POA: Diagnosis not present

## 2020-01-16 DIAGNOSIS — E785 Hyperlipidemia, unspecified: Secondary | ICD-10-CM | POA: Diagnosis not present

## 2020-01-16 DIAGNOSIS — D631 Anemia in chronic kidney disease: Secondary | ICD-10-CM | POA: Diagnosis not present

## 2020-01-16 DIAGNOSIS — K766 Portal hypertension: Secondary | ICD-10-CM | POA: Diagnosis not present

## 2020-01-16 DIAGNOSIS — E1122 Type 2 diabetes mellitus with diabetic chronic kidney disease: Secondary | ICD-10-CM | POA: Diagnosis not present

## 2020-01-16 DIAGNOSIS — I5032 Chronic diastolic (congestive) heart failure: Secondary | ICD-10-CM | POA: Diagnosis not present

## 2020-01-16 DIAGNOSIS — E11649 Type 2 diabetes mellitus with hypoglycemia without coma: Secondary | ICD-10-CM | POA: Diagnosis not present

## 2020-01-16 DIAGNOSIS — N186 End stage renal disease: Secondary | ICD-10-CM | POA: Diagnosis not present

## 2020-01-16 DIAGNOSIS — E1169 Type 2 diabetes mellitus with other specified complication: Secondary | ICD-10-CM | POA: Diagnosis not present

## 2020-01-16 DIAGNOSIS — Z299 Encounter for prophylactic measures, unspecified: Secondary | ICD-10-CM | POA: Diagnosis not present

## 2020-01-16 DIAGNOSIS — R600 Localized edema: Secondary | ICD-10-CM | POA: Diagnosis not present

## 2020-01-16 DIAGNOSIS — R1011 Right upper quadrant pain: Secondary | ICD-10-CM | POA: Diagnosis not present

## 2020-01-16 DIAGNOSIS — I8500 Esophageal varices without bleeding: Secondary | ICD-10-CM | POA: Diagnosis not present

## 2020-01-16 DIAGNOSIS — J9611 Chronic respiratory failure with hypoxia: Secondary | ICD-10-CM | POA: Diagnosis not present

## 2020-01-18 DIAGNOSIS — N186 End stage renal disease: Secondary | ICD-10-CM | POA: Diagnosis not present

## 2020-01-18 DIAGNOSIS — I8500 Esophageal varices without bleeding: Secondary | ICD-10-CM | POA: Diagnosis not present

## 2020-01-18 DIAGNOSIS — E785 Hyperlipidemia, unspecified: Secondary | ICD-10-CM | POA: Diagnosis not present

## 2020-01-18 DIAGNOSIS — K766 Portal hypertension: Secondary | ICD-10-CM | POA: Diagnosis not present

## 2020-01-18 DIAGNOSIS — E11649 Type 2 diabetes mellitus with hypoglycemia without coma: Secondary | ICD-10-CM | POA: Diagnosis not present

## 2020-01-18 DIAGNOSIS — E1169 Type 2 diabetes mellitus with other specified complication: Secondary | ICD-10-CM | POA: Diagnosis not present

## 2020-01-18 DIAGNOSIS — E1122 Type 2 diabetes mellitus with diabetic chronic kidney disease: Secondary | ICD-10-CM | POA: Diagnosis not present

## 2020-01-18 DIAGNOSIS — I5032 Chronic diastolic (congestive) heart failure: Secondary | ICD-10-CM | POA: Diagnosis not present

## 2020-01-18 DIAGNOSIS — D631 Anemia in chronic kidney disease: Secondary | ICD-10-CM | POA: Diagnosis not present

## 2020-01-18 DIAGNOSIS — I132 Hypertensive heart and chronic kidney disease with heart failure and with stage 5 chronic kidney disease, or end stage renal disease: Secondary | ICD-10-CM | POA: Diagnosis not present

## 2020-01-18 DIAGNOSIS — J9611 Chronic respiratory failure with hypoxia: Secondary | ICD-10-CM | POA: Diagnosis not present

## 2020-01-19 ENCOUNTER — Encounter (HOSPITAL_COMMUNITY): Payer: Self-pay | Admitting: Emergency Medicine

## 2020-01-19 ENCOUNTER — Inpatient Hospital Stay (HOSPITAL_COMMUNITY): Payer: Medicare Other

## 2020-01-19 ENCOUNTER — Inpatient Hospital Stay (HOSPITAL_COMMUNITY)
Admission: EM | Admit: 2020-01-19 | Discharge: 2020-01-22 | DRG: 432 | Disposition: A | Discharge status: Home / Self Care | Payer: Medicare Other | Attending: Internal Medicine | Admitting: Internal Medicine

## 2020-01-19 ENCOUNTER — Other Ambulatory Visit: Payer: Self-pay

## 2020-01-19 ENCOUNTER — Emergency Department (HOSPITAL_COMMUNITY): Payer: Medicare Other

## 2020-01-19 DIAGNOSIS — J90 Pleural effusion, not elsewhere classified: Secondary | ICD-10-CM | POA: Diagnosis not present

## 2020-01-19 DIAGNOSIS — R110 Nausea: Secondary | ICD-10-CM | POA: Diagnosis not present

## 2020-01-19 DIAGNOSIS — R0602 Shortness of breath: Secondary | ICD-10-CM | POA: Diagnosis not present

## 2020-01-19 DIAGNOSIS — E7220 Disorder of urea cycle metabolism, unspecified: Secondary | ICD-10-CM | POA: Diagnosis present

## 2020-01-19 DIAGNOSIS — Z803 Family history of malignant neoplasm of breast: Secondary | ICD-10-CM

## 2020-01-19 DIAGNOSIS — K7031 Alcoholic cirrhosis of liver with ascites: Secondary | ICD-10-CM | POA: Diagnosis present

## 2020-01-19 DIAGNOSIS — K7200 Acute and subacute hepatic failure without coma: Secondary | ICD-10-CM | POA: Diagnosis not present

## 2020-01-19 DIAGNOSIS — L84 Corns and callosities: Secondary | ICD-10-CM | POA: Diagnosis not present

## 2020-01-19 DIAGNOSIS — H9190 Unspecified hearing loss, unspecified ear: Secondary | ICD-10-CM | POA: Diagnosis present

## 2020-01-19 DIAGNOSIS — E871 Hypo-osmolality and hyponatremia: Secondary | ICD-10-CM | POA: Diagnosis not present

## 2020-01-19 DIAGNOSIS — K7030 Alcoholic cirrhosis of liver without ascites: Secondary | ICD-10-CM | POA: Diagnosis not present

## 2020-01-19 DIAGNOSIS — N400 Enlarged prostate without lower urinary tract symptoms: Secondary | ICD-10-CM | POA: Diagnosis present

## 2020-01-19 DIAGNOSIS — Z794 Long term (current) use of insulin: Secondary | ICD-10-CM

## 2020-01-19 DIAGNOSIS — E785 Hyperlipidemia, unspecified: Secondary | ICD-10-CM | POA: Diagnosis present

## 2020-01-19 DIAGNOSIS — Z823 Family history of stroke: Secondary | ICD-10-CM

## 2020-01-19 DIAGNOSIS — K7290 Hepatic failure, unspecified without coma: Secondary | ICD-10-CM | POA: Diagnosis not present

## 2020-01-19 DIAGNOSIS — R112 Nausea with vomiting, unspecified: Secondary | ICD-10-CM

## 2020-01-19 DIAGNOSIS — E1129 Type 2 diabetes mellitus with other diabetic kidney complication: Secondary | ICD-10-CM | POA: Diagnosis not present

## 2020-01-19 DIAGNOSIS — Z20822 Contact with and (suspected) exposure to covid-19: Secondary | ICD-10-CM | POA: Diagnosis present

## 2020-01-19 DIAGNOSIS — R609 Edema, unspecified: Secondary | ICD-10-CM | POA: Diagnosis present

## 2020-01-19 DIAGNOSIS — Z9981 Dependence on supplemental oxygen: Secondary | ICD-10-CM

## 2020-01-19 DIAGNOSIS — I4891 Unspecified atrial fibrillation: Secondary | ICD-10-CM | POA: Diagnosis present

## 2020-01-19 DIAGNOSIS — I5032 Chronic diastolic (congestive) heart failure: Secondary | ICD-10-CM | POA: Diagnosis present

## 2020-01-19 DIAGNOSIS — M549 Dorsalgia, unspecified: Secondary | ICD-10-CM

## 2020-01-19 DIAGNOSIS — E861 Hypovolemia: Secondary | ICD-10-CM | POA: Diagnosis not present

## 2020-01-19 DIAGNOSIS — D631 Anemia in chronic kidney disease: Secondary | ICD-10-CM | POA: Diagnosis not present

## 2020-01-19 DIAGNOSIS — I132 Hypertensive heart and chronic kidney disease with heart failure and with stage 5 chronic kidney disease, or end stage renal disease: Secondary | ICD-10-CM | POA: Diagnosis not present

## 2020-01-19 DIAGNOSIS — Z992 Dependence on renal dialysis: Secondary | ICD-10-CM | POA: Diagnosis present

## 2020-01-19 DIAGNOSIS — T473X6A Underdosing of saline and osmotic laxatives, initial encounter: Secondary | ICD-10-CM | POA: Diagnosis present

## 2020-01-19 DIAGNOSIS — E11649 Type 2 diabetes mellitus with hypoglycemia without coma: Secondary | ICD-10-CM | POA: Diagnosis not present

## 2020-01-19 DIAGNOSIS — M546 Pain in thoracic spine: Secondary | ICD-10-CM | POA: Diagnosis not present

## 2020-01-19 DIAGNOSIS — E1122 Type 2 diabetes mellitus with diabetic chronic kidney disease: Secondary | ICD-10-CM | POA: Diagnosis present

## 2020-01-19 DIAGNOSIS — M5136 Other intervertebral disc degeneration, lumbar region: Secondary | ICD-10-CM | POA: Diagnosis not present

## 2020-01-19 DIAGNOSIS — N2581 Secondary hyperparathyroidism of renal origin: Secondary | ICD-10-CM | POA: Diagnosis present

## 2020-01-19 DIAGNOSIS — E8809 Other disorders of plasma-protein metabolism, not elsewhere classified: Secondary | ICD-10-CM | POA: Diagnosis present

## 2020-01-19 DIAGNOSIS — K6389 Other specified diseases of intestine: Secondary | ICD-10-CM | POA: Diagnosis not present

## 2020-01-19 DIAGNOSIS — Z800 Family history of malignant neoplasm of digestive organs: Secondary | ICD-10-CM

## 2020-01-19 DIAGNOSIS — M160 Bilateral primary osteoarthritis of hip: Secondary | ICD-10-CM | POA: Diagnosis not present

## 2020-01-19 DIAGNOSIS — I517 Cardiomegaly: Secondary | ICD-10-CM | POA: Diagnosis not present

## 2020-01-19 DIAGNOSIS — Z66 Do not resuscitate: Secondary | ICD-10-CM | POA: Diagnosis present

## 2020-01-19 DIAGNOSIS — J9610 Chronic respiratory failure, unspecified whether with hypoxia or hypercapnia: Secondary | ICD-10-CM | POA: Diagnosis present

## 2020-01-19 DIAGNOSIS — E1165 Type 2 diabetes mellitus with hyperglycemia: Secondary | ICD-10-CM | POA: Diagnosis not present

## 2020-01-19 DIAGNOSIS — I120 Hypertensive chronic kidney disease with stage 5 chronic kidney disease or end stage renal disease: Secondary | ICD-10-CM | POA: Diagnosis not present

## 2020-01-19 DIAGNOSIS — K7040 Alcoholic hepatic failure without coma: Principal | ICD-10-CM | POA: Diagnosis present

## 2020-01-19 DIAGNOSIS — M5135 Other intervertebral disc degeneration, thoracolumbar region: Secondary | ICD-10-CM | POA: Diagnosis not present

## 2020-01-19 DIAGNOSIS — Z9114 Patient's other noncompliance with medication regimen: Secondary | ICD-10-CM

## 2020-01-19 DIAGNOSIS — E8770 Fluid overload, unspecified: Secondary | ICD-10-CM

## 2020-01-19 DIAGNOSIS — G9341 Metabolic encephalopathy: Secondary | ICD-10-CM | POA: Diagnosis present

## 2020-01-19 DIAGNOSIS — E162 Hypoglycemia, unspecified: Secondary | ICD-10-CM | POA: Diagnosis not present

## 2020-01-19 DIAGNOSIS — G9340 Encephalopathy, unspecified: Secondary | ICD-10-CM

## 2020-01-19 DIAGNOSIS — N186 End stage renal disease: Secondary | ICD-10-CM | POA: Diagnosis present

## 2020-01-19 DIAGNOSIS — Z87891 Personal history of nicotine dependence: Secondary | ICD-10-CM

## 2020-01-19 DIAGNOSIS — Z79899 Other long term (current) drug therapy: Secondary | ICD-10-CM

## 2020-01-19 DIAGNOSIS — K7682 Hepatic encephalopathy: Secondary | ICD-10-CM | POA: Diagnosis present

## 2020-01-19 DIAGNOSIS — E119 Type 2 diabetes mellitus without complications: Secondary | ICD-10-CM

## 2020-01-19 DIAGNOSIS — M545 Low back pain: Secondary | ICD-10-CM | POA: Diagnosis not present

## 2020-01-19 DIAGNOSIS — J811 Chronic pulmonary edema: Secondary | ICD-10-CM | POA: Diagnosis not present

## 2020-01-19 DIAGNOSIS — R52 Pain, unspecified: Secondary | ICD-10-CM

## 2020-01-19 LAB — SARS CORONAVIRUS 2 BY RT PCR (HOSPITAL ORDER, PERFORMED IN ~~LOC~~ HOSPITAL LAB): SARS Coronavirus 2: NEGATIVE

## 2020-01-19 LAB — CBC WITH DIFFERENTIAL/PLATELET
Abs Immature Granulocytes: 0.01 10*3/uL (ref 0.00–0.07)
Basophils Absolute: 0 10*3/uL (ref 0.0–0.1)
Basophils Relative: 0 %
Eosinophils Absolute: 0.1 10*3/uL (ref 0.0–0.5)
Eosinophils Relative: 3 %
HCT: 24.5 % — ABNORMAL LOW (ref 39.0–52.0)
Hemoglobin: 7.8 g/dL — ABNORMAL LOW (ref 13.0–17.0)
Immature Granulocytes: 0 %
Lymphocytes Relative: 7 %
Lymphs Abs: 0.3 10*3/uL — ABNORMAL LOW (ref 0.7–4.0)
MCH: 30.2 pg (ref 26.0–34.0)
MCHC: 31.8 g/dL (ref 30.0–36.0)
MCV: 95 fL (ref 80.0–100.0)
Monocytes Absolute: 0.2 10*3/uL (ref 0.1–1.0)
Monocytes Relative: 5 %
Neutro Abs: 3 10*3/uL (ref 1.7–7.7)
Neutrophils Relative %: 85 %
Platelets: 112 10*3/uL — ABNORMAL LOW (ref 150–400)
RBC: 2.58 MIL/uL — ABNORMAL LOW (ref 4.22–5.81)
RDW: 18 % — ABNORMAL HIGH (ref 11.5–15.5)
WBC: 3.5 10*3/uL — ABNORMAL LOW (ref 4.0–10.5)
nRBC: 0 % (ref 0.0–0.2)

## 2020-01-19 LAB — PROTIME-INR
INR: 1 (ref 0.8–1.2)
Prothrombin Time: 13.1 seconds (ref 11.4–15.2)

## 2020-01-19 LAB — CBG MONITORING, ED
Glucose-Capillary: 43 mg/dL — CL (ref 70–99)
Glucose-Capillary: 46 mg/dL — ABNORMAL LOW (ref 70–99)
Glucose-Capillary: 92 mg/dL (ref 70–99)

## 2020-01-19 LAB — COMPREHENSIVE METABOLIC PANEL
ALT: 27 U/L (ref 0–44)
AST: 25 U/L (ref 15–41)
Albumin: 2.7 g/dL — ABNORMAL LOW (ref 3.5–5.0)
Alkaline Phosphatase: 304 U/L — ABNORMAL HIGH (ref 38–126)
Anion gap: 14 (ref 5–15)
BUN: 36 mg/dL — ABNORMAL HIGH (ref 8–23)
CO2: 20 mmol/L — ABNORMAL LOW (ref 22–32)
Calcium: 8.6 mg/dL — ABNORMAL LOW (ref 8.9–10.3)
Chloride: 87 mmol/L — ABNORMAL LOW (ref 98–111)
Creatinine, Ser: 4.22 mg/dL — ABNORMAL HIGH (ref 0.61–1.24)
GFR calc Af Amer: 15 mL/min — ABNORMAL LOW (ref >60)
GFR calc non Af Amer: 13 mL/min — ABNORMAL LOW (ref >60)
Glucose, Bld: 59 mg/dL — ABNORMAL LOW (ref 70–99)
Potassium: 4.8 mmol/L (ref 3.5–5.1)
Sodium: 121 mmol/L — ABNORMAL LOW (ref 135–145)
Total Bilirubin: 1 mg/dL (ref 0.3–1.2)
Total Protein: 7.3 g/dL (ref 6.5–8.1)

## 2020-01-19 LAB — AMMONIA: Ammonia: 37 umol/L — ABNORMAL HIGH (ref 9–35)

## 2020-01-19 LAB — ETHANOL: Alcohol, Ethyl (B): <10 mg/dL (ref <10)

## 2020-01-19 MED ORDER — ADULT MULTIVITAMIN W/MINERALS CH
1.0000 | ORAL_TABLET | Freq: Every morning | ORAL | Status: DC
  Administered 2020-01-20 – 2020-01-22 (×3): 1 via ORAL
  Filled 2020-01-19 (×3): qty 1

## 2020-01-19 MED ORDER — TAMSULOSIN HCL 0.4 MG PO CAPS
0.4000 mg | ORAL_CAPSULE | Freq: Every evening | ORAL | Status: DC
  Administered 2020-01-19 – 2020-01-21 (×3): 0.4 mg via ORAL
  Filled 2020-01-19 (×3): qty 1

## 2020-01-19 MED ORDER — PRAVASTATIN SODIUM 10 MG PO TABS
20.0000 mg | ORAL_TABLET | Freq: Every morning | ORAL | Status: DC
  Administered 2020-01-20 – 2020-01-22 (×3): 20 mg via ORAL
  Filled 2020-01-19 (×3): qty 2

## 2020-01-19 MED ORDER — FUROSEMIDE 40 MG PO TABS
40.0000 mg | ORAL_TABLET | Freq: Two times a day (BID) | ORAL | Status: DC
  Administered 2020-01-20 – 2020-01-22 (×5): 40 mg via ORAL
  Filled 2020-01-19 (×5): qty 1

## 2020-01-19 MED ORDER — TRAMADOL HCL 50 MG PO TABS
50.0000 mg | ORAL_TABLET | Freq: Once | ORAL | Status: AC
  Administered 2020-01-19: 50 mg via ORAL
  Filled 2020-01-19: qty 1

## 2020-01-19 MED ORDER — THIAMINE HCL 100 MG PO TABS
100.0000 mg | ORAL_TABLET | Freq: Every morning | ORAL | Status: DC
  Administered 2020-01-20 – 2020-01-22 (×3): 100 mg via ORAL
  Filled 2020-01-19 (×4): qty 1

## 2020-01-19 MED ORDER — POLYSACCHARIDE IRON COMPLEX 150 MG PO CAPS
150.0000 mg | ORAL_CAPSULE | Freq: Every day | ORAL | Status: DC
  Administered 2020-01-19 – 2020-01-21 (×3): 150 mg via ORAL
  Filled 2020-01-19 (×3): qty 1

## 2020-01-19 MED ORDER — FUROSEMIDE 10 MG/ML IJ SOLN
80.0000 mg | Freq: Once | INTRAMUSCULAR | Status: AC
  Administered 2020-01-19: 80 mg via INTRAVENOUS
  Filled 2020-01-19: qty 8

## 2020-01-19 MED ORDER — METOPROLOL SUCCINATE ER 25 MG PO TB24: 25.0000 mg | ORAL_TABLET | ORAL | Status: DC

## 2020-01-19 MED ORDER — LACTULOSE 10 GM/15ML PO SOLN
30.0000 g | Freq: Three times a day (TID) | ORAL | Status: DC
  Administered 2020-01-19 – 2020-01-22 (×8): 30 g via ORAL
  Filled 2020-01-19 (×8): qty 60

## 2020-01-19 MED ORDER — ASCORBIC ACID 500 MG PO TABS
1000.0000 mg | ORAL_TABLET | Freq: Two times a day (BID) | ORAL | Status: DC
  Administered 2020-01-19 – 2020-01-22 (×6): 1000 mg via ORAL
  Filled 2020-01-19 (×6): qty 2

## 2020-01-19 MED ORDER — DEXTROSE 50 % IV SOLN
12.5000 g | INTRAVENOUS | Status: AC
  Administered 2020-01-19: 12.5 g via INTRAVENOUS

## 2020-01-19 MED ORDER — RIFAXIMIN 550 MG PO TABS
550.0000 mg | ORAL_TABLET | Freq: Two times a day (BID) | ORAL | Status: DC
  Administered 2020-01-19 – 2020-01-22 (×6): 550 mg via ORAL
  Filled 2020-01-19 (×6): qty 1

## 2020-01-19 MED ORDER — METHOCARBAMOL 500 MG PO TABS
500.0000 mg | ORAL_TABLET | Freq: Once | ORAL | Status: AC
  Administered 2020-01-19: 500 mg via ORAL
  Filled 2020-01-19: qty 1

## 2020-01-19 MED ORDER — VITAMIN B-12 1000 MCG PO TABS
1000.0000 ug | ORAL_TABLET | Freq: Every day | ORAL | Status: DC
  Administered 2020-01-19 – 2020-01-22 (×4): 1000 ug via ORAL
  Filled 2020-01-19 (×4): qty 1

## 2020-01-19 MED ORDER — PANTOPRAZOLE SODIUM 40 MG PO TBEC
40.0000 mg | DELAYED_RELEASE_TABLET | Freq: Every day | ORAL | Status: DC
  Administered 2020-01-19 – 2020-01-22 (×4): 40 mg via ORAL
  Filled 2020-01-19 (×4): qty 1

## 2020-01-19 MED ORDER — DEXTROSE 50 % IV SOLN
INTRAVENOUS | Status: AC
  Filled 2020-01-19: qty 50

## 2020-01-19 NOTE — ED Provider Notes (Addendum)
[facility] Provider Note   CSN: [id number] Arrival date & time: 01/19/20  1105     History Chief Complaint  Patient presents with  . Altered Mental Status    Jeffrey [name] is a 74 y.o. male.  HPI Patient brought in by wife for increasing back pain decreased oral intake and confusion.  He has a history of alcoholic cirrhosis with recent admissions for hepatic encephalopathy.  Has been noncompliant with his lactulose.  Saw his PCP a couple days ago and was given more pain medicines for his worsening back pain.  No injury.  States it is in the low back and thinks he has a bulging disc.  No blood in the stool or black stool.  However he is also a dialysis patient on Monday Wednesday and Friday.  Today is Friday and he was not dialyzed either today or on Wednesday.  Caring more fluid in his legs abdomen and upper extremities.  Patient had a palliative consult last admission and is a DNR/DNI but still wants full treatment otherwise.    Past Medical History:  Diagnosis Date  . A-fib [facility])    when initially stating dialysis january 2021 at [facility] per spouse; never heard anything else about it  . Alcoholic cirrhosis ([place])    patient reports completing hep a and b vaccines in 2006  . Anemia    has had 3 units of prbcs 2013  . Anxiety   . Arthritis   . Asymptomatic gallstones    Ultrasound in 2006  . C. difficile colitis   . Cholelithiasis   . Chronic kidney disease    Dialysis M/W/F/Sa  . Depression   . Diabetes ([place])   . Duodenal ulcer with hemorrhage    per patient in 2006 or 2007, records have been requested  . Dyspnea    low oxygen level -  has O2 2 L all day  . 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 GERD (gastroesophageal reflux disease)   . Heart burn   . 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 HOH (hard of hearing)   . Hypertension   . Liver cirrhosis ([place])   . Splenomegaly    Ultrasound in 2006    Patient Active Problem List    Diagnosis Date Noted  . Status post thoracentesis   . Hepatic encephalopathy ([place]) 01/01/2020  . Pleural effusion 12/31/2019  . Hypoglycemia 12/31/2019  . Hyperammonemia ([place]) 12/31/2019  . Obesity (BMI 30-39.9) 12/31/2019  . Hypoalbuminemia 12/31/2019  . Nosebleed   . Goals of care, counseling/discussion   . Palliative care by specialist   . Acute hepatic encephalopathy 12/13/2019  . Encephalopathy acute   . Hyperkalemia   . Hyponatremia   . Anemia 11/16/2019  . Acute blood loss anemia 09/20/2019  . Gastrointestinal hemorrhage   . Acute on chronic anemia 09/18/2019  . Generalized weakness 09/18/2019  . Type 2 diabetes mellitus ([place]) 09/18/2019  . 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[place]) 09/18/2019  . Enteritis due to Clostridium difficile 08/31/2019  . Edema 04/11/2019  . Alcoholic cirrhosis ([place]) 38/46/6599  . Type 2 diabetes mellitus with stage 3 chronic kidney disease, with long-term current use of insulin ([place]) 10/20/2017  . Essential hypertension, benign 10/20/2017  . Morbid obesity ([place]) 10/20/2017  . Mixed hyperlipidemia 10/20/2017  . Ascites   . SBP (spontaneous bacterial peritonitis) ([place]) 01/12/2016  . Shigella dysenteriae 01/11/2016  . Diarrhea 01/10/2016  . Cirrhosis ([place])  09/05/2012  . Alcoholic cirrhosis of liver with ascites ([place]) 03/09/2012  . Heme positive stool 03/09/2012  . Anemia due to chronic blood loss 03/09/2012  . Pancytopenia ([place]) 03/09/2012  . FH: colon cancer 03/09/2012  . Esophageal varices in alcoholic cirrhosis ([place]) 96/29/5284    Past Surgical History:  Procedure Laterality Date  . AGILE CAPSULE N/A 09/20/2019   Procedure: AGILE CAPSULE;  Surgeon: Daneil Dolin, MD;  [facility];  Service: Endoscopy;  Laterality: N/A;  . APPENDECTOMY    . AV FISTULA PLACEMENT Left 08/17/2019   Procedure: ARTERIOVENOUS (AV) FISTULA CREATION VERSES ARTERIOVENOUS GRAFT;  Surgeon: [name], MD;  Location: [facility];  Service: Vascular;   Laterality: Left;  . BASCILIC VEIN TRANSPOSITION Left 09/28/2019   Procedure: LEFT SECOND STAGE BASCILIC VEIN TRANSPOSITION;  Surgeon: [name], MD;  Location: [facility];  Service: Vascular;  Laterality: Left;  . BIOPSY  02/03/2018   Procedure: BIOPSY;  Surgeon: Daneil Dolin, MD;  [facility];  Service: Endoscopy;;  bx of gastric polyps  . BIOPSY  09/19/2019   Procedure: BIOPSY;  Surgeon: Danie Binder, MD;  [facility];  Service: Endoscopy;;  . CATARACT EXTRACTION Right   . COLONOSCOPY  03/10/2005   Rectal polyp as described above, removed with snare. Left sided  diverticula. The remainder of the colonic mucosa appeared normal. Inflammed polyp on path.  . COLONOSCOPY  04/1999   Dr. Thea Silversmith polyps removed,  Path showed hyperplastic  . COLONOSCOPY  10/2015   Dr. Britta Mccreedy: diverticulosis, single sessile tubular adenoma 3-28mm in size removed from descending colon.   . COLONOSCOPY WITH ESOPHAGOGASTRODUODENOSCOPY (EGD)  03/31/2012   XLK:GMWNUUV AVMs. Colonic diverticulosis. tubular adenoma colon  . COLONOSCOPY WITH ESOPHAGOGASTRODUODENOSCOPY (EGD)  06/2019   FORSYTH: small esophageal varices without high risk stigmata, single large AVM on lesser curvature in gastric body s/p APC ablation, gastric antral and duodenal bulb polyposis. No significant source to explain transfusion dependent anemia. Colonoscopy with portal colopathy and diffuse edema, changes of Cdiff colitis on colonoscopy  . ESOPHAGEAL BANDING N/A 02/03/2018   Procedure: ESOPHAGEAL BANDING;  Surgeon: Daneil Dolin, MD;  [facility];  Service: Endoscopy;  Laterality: N/A;  . ESOPHAGOGASTRODUODENOSCOPY  01/15/2005   Three columns grade 1 to 2 esophageal varices, otherwise normal esophageal mucosa.  Esophagus was not manipulated otherwise./Nodularity of the antrum with overlying erosions, nonspecific finding. Path showed rare H.pylori  . ESOPHAGOGASTRODUODENOSCOPY  09/2008   Dr. Gaylene Brooks  columns of grade 2-3 esoph varices, only one column was prominent. Portal gastropathy, multiple gastrc polyps at antrum, two were 2cm with black eschar, bulbar polyps, bulbar erosions  . ESOPHAGOGASTRODUODENOSCOPY  11/2004   Dr. Brantley Stage 3 esoph varices  . ESOPHAGOGASTRODUODENOSCOPY  03/31/2012   RMR: 4 columns(3-GR 2, 1-GR1) non-bleeding esophageal varices, portal gastropathy, small HH, early GAVE, multiple gastric polyps   . ESOPHAGOGASTRODUODENOSCOPY (EGD) WITH PROPOFOL N/A 02/03/2018   Dr. Gala Romney: Esophageal varices, 3 columns grade 1-2.  Portal hypertensive gastropathy.  Multiple gastric polyps, biopsy consistent with hyperplastic.  [name] ESOPHAGOGASTRODUODENOSCOPY (EGD) WITH PROPOFOL N/A 09/19/2019   Fields: grade I and II esophageal varcies, mild portal hypertensive gastropathy, moderate gastritis but no H. pylori, single hyperplastic gastric polyp removed, obvious source for melena/transfusion dependent anemia not identified, may be due to friable gastric and duodenal mucosa in the setting of portal hypertension  . GASTRIC VARICES BANDING  03/31/2012   Procedure: GASTRIC VARICES BANDING;  Surgeon: Daneil Dolin, MD;  [facility];  Service: Endoscopy;  Laterality: N/A;  . GIVENS CAPSULE STUDY N/A 09/21/2019   Poor study with a lot of debris obstructing much of the view of the first approximate 3 hours out of 6 hours.  No obvious source of bleeding identified.  Sequela of bleeding and old blood seen in the form of"whisps" of blood, blood flecks mostly toward the end of the study.  [name] POLYPECTOMY  09/19/2019   Procedure: POLYPECTOMY;  Surgeon: Danie Binder, MD;  [facility];  Service: Endoscopy;;  . UMBILICAL HERNIA REPAIR  2017   exploratory laparotomy, abdominal washout       Family History  Problem Relation Age of Onset  . Colon cancer Father 39       deceased age 46  . Breast cancer Sister        deceased  . Diabetes Sister   . Stroke Mother   . Healthy  Son   . Healthy Daughter   . Lung cancer Neg Hx   . Ovarian cancer Neg Hx     Social History   Tobacco Use  . Smoking status: Former Smoker    Packs/day: 2.00    Years: 25.00    Pack years: 50.00    Types: Cigarettes    Quit date: 01/28/1986    Years since quitting: 33.9  . Smokeless tobacco: Never Used  Vaping Use  . Vaping Use: Never used  Substance Use Topics  . Alcohol use: No    Comment: quit in 05/2013  . Drug use: No    Home Medications Prior to Admission medications   Medication Sig Start Date End Date Taking? Authorizing Provider  Ascorbic Acid (VITAMIN C) 1000 MG tablet Take 1,000 mg by mouth 2 (two) times daily.    [provider]  furosemide (LASIX) 40 MG tablet Take 40 mg by mouth 2 (two) times daily. 12/29/19   [provider]  gabapentin (NEURONTIN) 100 MG capsule TAKE 1 CAPSULE BY MOUTH AT BEDTIME 10/05/19   Waynetta Sandy, MD  HUMALOG KWIKPEN 100 UNIT/ML KwikPen as needed. Sliding scale 11/05/19   [provider]  HYDROcodone-acetaminophen (NORCO) 10-325 MG tablet Take 1 tablet by mouth every 6 (six) hours as needed for moderate pain. Patient taking differently: Take 1 tablet by mouth as needed for moderate pain.  09/28/19   Rhyne, Hulen Shouts, PA-C  insulin degludec (TRESIBA FLEXTOUCH) 200 UNIT/ML FlexTouch Pen Inject 30 Units into the skin at bedtime. When blood sugars are up 01/06/20   Barton Dubois, MD  iron polysaccharides (NIFEREX) 150 MG capsule Take 150 mg by mouth at bedtime.     [provider]  lactulose (CHRONULAC) 10 GM/15ML solution Take 45 mLs (30 g total) by mouth 3 (three) times daily. 01/06/20   Barton Dubois, MD  metoprolol succinate (TOPROL-XL) 25 MG 24 hr tablet Take 1 tablet (25 mg total) by mouth daily. Take at bedtime  on Monday ,Wednesday and Friday due to dialaylisis 01/06/20   Barton Dubois, MD  milk thistle 175 MG tablet Take 175 mg by mouth 3 (three) times daily.     [provider]   Multiple Vitamin (MULTIVITAMIN WITH MINERALS) TABS tablet Take 1 tablet by mouth in the morning.     [provider]  nystatin (MYCOSTATIN/NYSTOP) powder Apply topically 3 (three) times daily. Into groin area and affected area 01/06/20   Barton Dubois, MD  omega-3 acid ethyl esters (LOVAZA) 1 g capsule Take 2 g by mouth  2 (two) times daily.  04/07/19   [provider]  omeprazole (PRILOSEC) 40 MG capsule Take 40 mg by mouth in the morning.     [provider]  pravastatin (PRAVACHOL) 20 MG tablet Take 20 mg by mouth in the morning.     [provider]  Probiotic Product (PROBIOTIC DAILY PO) Take 1 tablet by mouth in the morning.     [provider]  rifaximin (XIFAXAN) 550 MG TABS tablet Take 1 tablet (550 mg total) by mouth 2 (two) times daily. 01/13/19   Mahala Menghini, PA-C  spironolactone (ALDACTONE) 50 MG tablet Take 50 mg by mouth in the morning.     [provider]  tamsulosin (FLOMAX) 0.4 MG CAPS capsule Take 0.4 mg by mouth every evening.    [provider]  thiamine 100 MG tablet Take 100 mg by mouth in the morning. Vitamin B-1    [provider]  triamcinolone cream (KENALOG) 0.1 % Apply 1 application topically 2 (two) times daily as needed (dry skin Itching).  12/30/17   [provider]  venlafaxine (EFFEXOR) 75 MG tablet Take 75 mg by mouth 3 (three) times daily with meals.     [provider]  vitamin B-12 (CYANOCOBALAMIN) 1000 MCG tablet Take 1,000 mcg by mouth daily.    [provider]    Allergies    Chlorhexidine and Tape  Review of Systems   Review of Systems  Constitutional: Positive for appetite change.  HENT: Negative for congestion.   Respiratory: Negative for shortness of breath.   Cardiovascular: Negative for chest pain.  Gastrointestinal: Positive for abdominal pain. Negative for nausea.  Genitourinary: Negative for flank pain.  Musculoskeletal: Positive for back pain.   Skin: Negative for rash.  Neurological: Negative for weakness.  Psychiatric/Behavioral: Positive for confusion.    Physical Exam Updated Vital Signs BP (!) 168/62 (BP Location: Right Arm)   Pulse 90   Temp 98.1 F (36.7 C) (Oral)   Resp (!) 22   Ht 5\' 7"  (1.702 m)   Wt 100.7 kg   SpO2 95%   BMI 34.77 kg/m   Physical Exam Vitals and nursing note reviewed.  HENT:     Head: Normocephalic.  Eyes:     Pupils: Pupils are equal, round, and reactive to light.  Cardiovascular:     Rate and Rhythm: Regular rhythm.  Pulmonary:     Breath sounds: Rales present.     Comments: Mild rales bilateral bases. Abdominal:     Tenderness: There is abdominal tenderness.     Comments: Mild diffuse tenderness without rebound or guarding.  Some distention.  Musculoskeletal:     Cervical back: Neck supple.     Right lower leg: Edema present.     Left lower leg: Edema present.     Comments: Pitting edema bilateral lower extremities.  Tenderness right low back without rash.  No midline tenderness.  Skin:    Capillary Refill: Capillary refill takes less than 2 seconds.  Neurological:     Mental Status: He is alert and oriented to person, place, and time.     ED Results / Procedures / Treatments   Labs (all labs ordered are listed, but only abnormal results are displayed) Labs Reviewed  CBC WITH DIFFERENTIAL/PLATELET - Abnormal; Notable for the following components:      Result Value   WBC 3.5 (*)    RBC 2.58 (*)    Hemoglobin 7.8 (*)    HCT 24.5 (*)  RDW 18.0 (*)    Platelets 112 (*)    Lymphs Abs 0.3 (*)    All other components within normal limits  COMPREHENSIVE METABOLIC PANEL - Abnormal; Notable for the following components:   Sodium 121 (*)    Chloride 87 (*)    CO2 20 (*)    Glucose, Bld 59 (*)    BUN 36 (*)    Creatinine, Ser 4.22 (*)    Calcium 8.6 (*)    Albumin 2.7 (*)    Alkaline Phosphatase 304 (*)    GFR calc non Af Amer 13 (*)    GFR calc Af Amer 15 (*)     All other components within normal limits  AMMONIA - Abnormal; Notable for the following components:   Ammonia 37 (*)    All other components within normal limits  SARS CORONAVIRUS 2 BY RT PCR (HOSPITAL ORDER, [facility])  PROTIME-INR  ETHANOL  URINALYSIS, ROUTINE W REFLEX MICROSCOPIC  CBG MONITORING, ED    EKG None  Radiology DG Chest Portable 1 View  Result Date: 01/19/2020 CLINICAL DATA:  Shortness of breath.  Missed dialysis EXAM: PORTABLE CHEST 1 VIEW COMPARISON:  01/01/2020 FINDINGS: Stable right IJ approach hemodialysis catheter. Cardiomegaly. Pulmonary vascular congestion with increased interstitial markings bilaterally. Suspect small left pleural effusion. No pneumothorax. IMPRESSION: 1. Cardiomegaly with pulmonary vascular congestion and interstitial edema. 2. Suspect small left pleural effusion. Electronically Signed   By: Davina Poke D.O.   On: 01/19/2020 14:36    Procedures Procedures (including critical care time)  Medications Ordered in ED Medications - No data to display  ED Course  I have reviewed the triage vital signs and the nursing notes.  Pertinent labs & imaging results that were available during my care of the patient were reviewed by me and considered in my medical decision making (see chart for details).    MDM Rules/Calculators/A&P                          Patient with mental status change.  Decreased oral intake.  Increase fluid retention.  Has not had his last 2 dialysis courses.  Appears to be volume overloaded.  Good potassium however.  Chest x-ray shows CHF.  Ammonia mildly elevated.  However with volume overload mental status change patient will require admission the hospital.  Will discuss with hospitalist.  Mild hyperglycemia will also be rechecked and potentially supplemented. Also has hyponatremia.  Discussed with Dr. Candiss Norse from nephrology.  Will set up for dialysis tomorrow.  Patient should be able to stay at  [facility] Impression(s) / ED Diagnoses Final diagnoses:  Hepatic encephalopathy ([place])  End stage renal failure on dialysis ([place])  Hypervolemia, unspecified hypervolemia type    Rx / DC Orders ED Discharge Orders    None       Davonna Belling, MD 01/19/20 1516    Davonna Belling, MD 01/19/20 [phone]

## 2020-01-19 NOTE — ED Notes (Signed)
ED Provider at bedside. 

## 2020-01-19 NOTE — H&P (Signed)
TRH H&P   Patient Demographics:    Jeffrey [name], is a 74 y.o. male  MRN: [id number]   DOB - 05-22-1946  Admit Date - 01/19/2020  Outpatient Primary MD for the patient is Monico Blitz, MD  Referring MD/NP/PA: Dr Kris Mouton  Patient coming from: home  Chief Complaint  Patient presents with  . Altered Mental Status      HPI:    Jeffrey [name]  is a 74 y.o. male, with medical history significant foralcoholic liver cirrhosis, ESRD(MWF), hypertension, diabetes mellitus type 2, hyperlipidemia, transfusion dependent anemia, portal hypertensive gastropathy, esophageal varicesrecent hospitalization earlier this month due to hepatic encephalopathy due to hyperammonemia and hypoglycemia, history was obtained from the wife by phone given he is poor historian, patient was seen by PCP recently, given some more pain medication for worsening lower back pain, there is no recent trauma or injury, patient with significant lower back pain where he could not tolerate to go to dialysis on Wednesday, as well same thing, he missed his hemodialysis, as well wife reported he had stopped taking his lactulose for last 48 hours as well, no fever, chills, melena, coffee-ground emesis, patient having worsening of her extremities. - in ED today to 95% on room air, work-up significant for sodium of 121, potassium of 4.8, glucose of 59, creatinine of 4.2, hemoglobin at baseline of 7.8, platelet at 112, ammonia at 37, [facility]ist consulted to admit   Review of systems:    In addition to the HPI above,  No Fever-chills, poor appetite No Headache, No changes with Vision or hearing, No problems swallowing food or Liquids, No Chest pain, Cough or Shortness of Breath, Ports abdominal pain, nausea and vomiting  No Blood in stool or Urine, No dysuria, has chronic Foley catheter. No new skin rashes or bruises, No new  joints pains-aches,  No new weakness, tingling, numbness in any extremity, No recent weight gain or loss, No polyuria, polydypsia or polyphagia, No significant Mental Stressors.  Has some confusion.  A full 10 point Review of Systems was done, except as stated above, all other Review of Systems were negative.   With Past History of the following :    Past Medical History:  Diagnosis Date  . A-fib [facility])    when initially stating dialysis january 2021 at [facility] per spouse; never heard anything else about it  . Alcoholic cirrhosis ([place])    patient reports completing hep a and b vaccines in 2006  . Anemia    has had 3 units of prbcs 2013  . Anxiety   . Arthritis   . Asymptomatic gallstones    Ultrasound in 2006  . C. difficile colitis   . Cholelithiasis   . Chronic kidney disease    Dialysis M/W/F/Sa  . Depression   . Diabetes ([place])   . Duodenal ulcer with hemorrhage    per patient in 2006  or 2007, records have been requested  . Dyspnea    low oxygen level -  has O2 2 L all day  . 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 GERD (gastroesophageal reflux disease)   . Heart burn   . 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 HOH (hard of hearing)   . Hypertension   . Liver cirrhosis ([place])   . Splenomegaly    Ultrasound in 2006      Past Surgical History:  Procedure Laterality Date  . AGILE CAPSULE N/A 09/20/2019   Procedure: AGILE CAPSULE;  Surgeon: Daneil Dolin, MD;  [facility];  Service: Endoscopy;  Laterality: N/A;  . APPENDECTOMY    . AV FISTULA PLACEMENT Left 08/17/2019   Procedure: ARTERIOVENOUS (AV) FISTULA CREATION VERSES ARTERIOVENOUS GRAFT;  Surgeon: [name], MD;  Location: [place];  Service: Vascular;  Laterality: Left;  . BASCILIC VEIN TRANSPOSITION Left 09/28/2019   Procedure: LEFT SECOND STAGE BASCILIC VEIN TRANSPOSITION;  Surgeon: [name], MD;  Location: [place];  Service: Vascular;  Laterality: Left;  . BIOPSY  02/03/2018    Procedure: BIOPSY;  Surgeon: Daneil Dolin, MD;  [facility];  Service: Endoscopy;;  bx of gastric polyps  . BIOPSY  09/19/2019   Procedure: BIOPSY;  Surgeon: Danie Binder, MD;  [facility];  Service: Endoscopy;;  . CATARACT EXTRACTION Right   . COLONOSCOPY  03/10/2005   Rectal polyp as described above, removed with snare. Left sided  diverticula. The remainder of the colonic mucosa appeared normal. Inflammed polyp on path.  . COLONOSCOPY  04/1999   Dr. Thea Silversmith polyps removed,  Path showed hyperplastic  . COLONOSCOPY  10/2015   Dr. Britta Mccreedy: diverticulosis, single sessile tubular adenoma 3-29mm in size removed from descending colon.   . COLONOSCOPY WITH ESOPHAGOGASTRODUODENOSCOPY (EGD)  03/31/2012   YKD:XIPJASN AVMs. Colonic diverticulosis. tubular adenoma colon  . COLONOSCOPY WITH ESOPHAGOGASTRODUODENOSCOPY (EGD)  06/2019   FORSYTH: small esophageal varices without high risk stigmata, single large AVM on lesser curvature in gastric body s/p APC ablation, gastric antral and duodenal bulb polyposis. No significant source to explain transfusion dependent anemia. Colonoscopy with portal colopathy and diffuse edema, changes of Cdiff colitis on colonoscopy  . ESOPHAGEAL BANDING N/A 02/03/2018   Procedure: ESOPHAGEAL BANDING;  Surgeon: Daneil Dolin, MD;  [facility];  Service: Endoscopy;  Laterality: N/A;  . ESOPHAGOGASTRODUODENOSCOPY  01/15/2005   Three columns grade 1 to 2 esophageal varices, otherwise normal esophageal mucosa.  Esophagus was not manipulated otherwise./Nodularity of the antrum with overlying erosions, nonspecific finding. Path showed rare H.pylori  . ESOPHAGOGASTRODUODENOSCOPY  09/2008   Dr. Gaylene Brooks columns of grade 2-3 esoph varices, only one column was prominent. Portal gastropathy, multiple gastrc polyps at antrum, two were 2cm with black eschar, bulbar polyps, bulbar erosions  . ESOPHAGOGASTRODUODENOSCOPY  11/2004   Dr.  Brantley Stage 3 esoph varices  . ESOPHAGOGASTRODUODENOSCOPY  03/31/2012   RMR: 4 columns(3-GR 2, 1-GR1) non-bleeding esophageal varices, portal gastropathy, small HH, early GAVE, multiple gastric polyps   . ESOPHAGOGASTRODUODENOSCOPY (EGD) WITH PROPOFOL N/A 02/03/2018   Dr. Gala Romney: Esophageal varices, 3 columns grade 1-2.  Portal hypertensive gastropathy.  Multiple gastric polyps, biopsy consistent with hyperplastic.  [name] ESOPHAGOGASTRODUODENOSCOPY (EGD) WITH PROPOFOL N/A 09/19/2019   Fields: grade I and II esophageal varcies, mild portal hypertensive gastropathy, moderate gastritis but no H. pylori, single hyperplastic gastric polyp removed, obvious source for melena/transfusion dependent anemia  not identified, may be due to friable gastric and duodenal mucosa in the setting of portal hypertension  . GASTRIC VARICES BANDING  03/31/2012   Procedure: GASTRIC VARICES BANDING;  Surgeon: Daneil Dolin, MD;  [facility];  Service: Endoscopy;  Laterality: N/A;  . GIVENS CAPSULE STUDY N/A 09/21/2019   Poor study with a lot of debris obstructing much of the view of the first approximate 3 hours out of 6 hours.  No obvious source of bleeding identified.  Sequela of bleeding and old blood seen in the form of"whisps" of blood, blood flecks mostly toward the end of the study.  [name] POLYPECTOMY  09/19/2019   Procedure: POLYPECTOMY;  Surgeon: Danie Binder, MD;  [facility];  Service: Endoscopy;;  . UMBILICAL HERNIA REPAIR  2017   exploratory laparotomy, abdominal washout      Social History:     Social History   Tobacco Use  . Smoking status: Former Smoker    Packs/day: 2.00    Years: 25.00    Pack years: 50.00    Types: Cigarettes    Quit date: 01/28/1986    Years since quitting: 33.9  . Smokeless tobacco: Never Used  Substance Use Topics  . Alcohol use: No    Comment: quit in 05/2013       Family History :     Family History  Problem Relation Age of Onset  . Colon  cancer Father 69       deceased age 39  . Breast cancer Sister        deceased  . Diabetes Sister   . Stroke Mother   . Healthy Son   . Healthy Daughter   . Lung cancer Neg Hx   . Ovarian cancer Neg Hx      Home Medications:   Prior to Admission medications   Medication Sig Start Date End Date Taking? Authorizing Provider  Ascorbic Acid (VITAMIN C) 1000 MG tablet Take 1,000 mg by mouth 2 (two) times daily.    [provider]  furosemide (LASIX) 40 MG tablet Take 40 mg by mouth 2 (two) times daily. 12/29/19   [provider]  gabapentin (NEURONTIN) 100 MG capsule TAKE 1 CAPSULE BY MOUTH AT BEDTIME 10/05/19   Waynetta Sandy, MD  HUMALOG KWIKPEN 100 UNIT/ML KwikPen as needed. Sliding scale 11/05/19   [provider]  HYDROcodone-acetaminophen (NORCO) 10-325 MG tablet Take 1 tablet by mouth every 6 (six) hours as needed for moderate pain. Patient taking differently: Take 1 tablet by mouth as needed for moderate pain.  09/28/19   Rhyne, Hulen Shouts, PA-C  insulin degludec (TRESIBA FLEXTOUCH) 200 UNIT/ML FlexTouch Pen Inject 30 Units into the skin at bedtime. When blood sugars are up 01/06/20   Barton Dubois, MD  iron polysaccharides (NIFEREX) 150 MG capsule Take 150 mg by mouth at bedtime.     [provider]  lactulose (CHRONULAC) 10 GM/15ML solution Take 45 mLs (30 g total) by mouth 3 (three) times daily. 01/06/20   Barton Dubois, MD  metoprolol succinate (TOPROL-XL) 25 MG 24 hr tablet Take 1 tablet (25 mg total) by mouth daily. Take at bedtime  on Monday ,Wednesday and Friday due to dialaylisis 01/06/20   Barton Dubois, MD  milk thistle 175 MG tablet Take 175 mg by mouth 3 (three) times daily.     [provider]  Multiple Vitamin (MULTIVITAMIN WITH MINERALS) TABS tablet Take 1 tablet by mouth in the morning.  [provider]  nystatin (MYCOSTATIN/NYSTOP) powder Apply topically 3 (three) times daily. Into groin area and affected area  01/06/20   Barton Dubois, MD  omega-3 acid ethyl esters (LOVAZA) 1 g capsule Take 2 g by mouth 2 (two) times daily.  04/07/19   [provider]  omeprazole (PRILOSEC) 40 MG capsule Take 40 mg by mouth in the morning.     [provider]  pravastatin (PRAVACHOL) 20 MG tablet Take 20 mg by mouth in the morning.     [provider]  Probiotic Product (PROBIOTIC DAILY PO) Take 1 tablet by mouth in the morning.     [provider]  rifaximin (XIFAXAN) 550 MG TABS tablet Take 1 tablet (550 mg total) by mouth 2 (two) times daily. 01/13/19   Mahala Menghini, PA-C  spironolactone (ALDACTONE) 50 MG tablet Take 50 mg by mouth in the morning.     [provider]  tamsulosin (FLOMAX) 0.4 MG CAPS capsule Take 0.4 mg by mouth every evening.    [provider]  thiamine 100 MG tablet Take 100 mg by mouth in the morning. Vitamin B-1    [provider]  triamcinolone cream (KENALOG) 0.1 % Apply 1 application topically 2 (two) times daily as needed (dry skin Itching).  12/30/17   [provider]  venlafaxine (EFFEXOR) 75 MG tablet Take 75 mg by mouth 3 (three) times daily with meals.     [provider]  vitamin B-12 (CYANOCOBALAMIN) 1000 MCG tablet Take 1,000 mcg by mouth daily.    [provider]     Allergies:     Allergies  Allergen Reactions  . Chlorhexidine   . Tape Itching and Rash     Physical Exam:   Vitals  Blood pressure (!) 168/62, pulse 90, temperature 98.1 F (36.7 C), temperature source Oral, resp. rate (!) 22, height 5\' 7"  (1.702 m), weight 100.7 kg, SpO2 95 %.   1. General well-developed, obese male, laying in bed, no apparent distress.  2.  Patient  is confused, awake alert x2   3. No F.N deficits, ALL C.Nerves Intact, Strength 5/5 all 4 extremities, Sensation intact all 4 extremities, Plantars down going.  4. Ears and Eyes appear Normal, Conjunctivae clear, PERRLA. Moist Oral Mucosa.  5. Supple  Neck, No JVD, No cervical lymphadenopathy appriciated, No Carotid Bruits.  6. Symmetrical Chest wall movement, Minister entry at the bases, no wheezing, no increased work of breathing.  7. RRR, No Gallops, Rubs or Murmurs, No Parasternal Heave, +1 edema.  8. Positive Bowel Sounds, abdomen is nontender, no ascites wave, No organomegaly appriciated,No rebound -guarding or rigidity.  9.  No Cyanosis, Normal Skin Turgor, No Skin Rash or Bruise.  10. Good muscle tone,  joints appear normal , no effusions, Normal ROM.  11. No Palpable Lymph Nodes in Neck or Axillae     Data Review:    CBC Recent Labs  Lab 01/19/20 1351  WBC 3.5*  HGB 7.8*  HCT 24.5*  PLT 112*  MCV 95.0  MCH 30.2  MCHC 31.8  RDW 18.0*  LYMPHSABS 0.3*  MONOABS 0.2  EOSABS 0.1  BASOSABS 0.0   ------------------------------------------------------------------------------------------------------------------  Chemistries  Recent Labs  Lab 01/19/20 1351  NA 121*  K 4.8  CL 87*  CO2 20*  GLUCOSE 59*  BUN 36*  CREATININE 4.22*  CALCIUM 8.6*  AST 25  ALT 27  ALKPHOS 304*  BILITOT 1.0   ------------------------------------------------------------------------------------------------------------------ estimated creatinine clearance is 17.9 mL/min (  A) (by C-G formula based on SCr of 4.22 mg/dL (H)). ------------------------------------------------------------------------------------------------------------------ No results for input(s): TSH, T4TOTAL, T3FREE, THYROIDAB in the last 72 hours.  Invalid input(s): FREET3  Coagulation profile Recent Labs  Lab 01/19/20 1351  INR 1.0   ------------------------------------------------------------------------------------------------------------------- No results for input(s): DDIMER in the last 72 hours. -------------------------------------------------------------------------------------------------------------------  Cardiac Enzymes No results for input(s):  CKMB, TROPONINI, MYOGLOBIN in the last 168 hours.  Invalid input(s): CK ------------------------------------------------------------------------------------------------------------------ No results found for: BNP   ---------------------------------------------------------------------------------------------------------------  Urinalysis    Component Value Date/Time   COLORURINE AMBER (A) 01/04/2020 1259   APPEARANCEUR CLOUDY (A) 01/04/2020 1259   LABSPEC 1.016 01/04/2020 1259   PHURINE 5.0 01/04/2020 1259   GLUCOSEU NEGATIVE 01/04/2020 1259   HGBUR LARGE (A) 01/04/2020 1259   BILIRUBINUR NEGATIVE 01/04/2020 1259   KETONESUR NEGATIVE 01/04/2020 1259   PROTEINUR 30 (A) 01/04/2020 1259   NITRITE NEGATIVE 01/04/2020 1259   LEUKOCYTESUR LARGE (A) 01/04/2020 1259    ----------------------------------------------------------------------------------------------------------------   Imaging Results:    DG Chest Portable 1 View  Result Date: 01/19/2020 CLINICAL DATA:  Shortness of breath.  Missed dialysis EXAM: PORTABLE CHEST 1 VIEW COMPARISON:  01/01/2020 FINDINGS: Stable right IJ approach hemodialysis catheter. Cardiomegaly. Pulmonary vascular congestion with increased interstitial markings bilaterally. Suspect small left pleural effusion. No pneumothorax. IMPRESSION: 1. Cardiomegaly with pulmonary vascular congestion and interstitial edema. 2. Suspect small left pleural effusion. Electronically Signed   By: Davina Poke D.O.   On: 01/19/2020 14:36      Assessment & Plan:    Active Problems:   Alcoholic cirrhosis (HCC)   Edema   Type 2 diabetes mellitus (HCC)   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[place])   Acute hepatic encephalopathy   Encephalopathy acute   Hyponatremia   Hypoglycemia   Hyperammonemia (HCC)   Hepatic encephalopathy (HCC)  Acute encephalopathy/generalized weakness/deconditioning -This is multifactorial, in the setting of hypoglycemia, and missing hemodialysis.   Possibly due to recently increased pain medications. -We will consult PT/OT, renal consulted to resume dialysis, he will be resumed on his lactulose. -We will hold insulin for now till oral intake improves.  ESRD on hemodialysis Monday Wednesday Friday -Calluses Wednesday and Friday secondary to back pain and nausea and vomiting. -Renal consulted, he will be dialyzed tomorrow, so far no indication for emergent dialysis potassium is 0.8, he is 95% on room air with no increased work of breathing. -We will give 80 mg IV Lasix once  Diabetes mellitus, type II, poorly controlled with recurrent hypoglycemia -A1c during recent admission 5.9, will hold her Tyler Aas, will keep him on renal diet (not carb modified), will monitor CBG closely, if no improvement will start on D10W  Hyponatremia -Likely from missing hemodialysis, as well due to liver disease.  History of alcoholic cirrhosis of liver with ascites -We will resume home medication Lasix and Aldactone tomorrow after dialysis, continue with rifaximin. -Continue with beta-blockers and PPI. -abdomen with mild fullness, but nontender, no clear ascites wave, no indication for paracentesis currently -Continue with lactulose.  Lower back pain -Wife reports main reason prevented patient from going to dialysis his lower back pain, will consult PT/OT, and will obtain x-ray of thoracic/lumbar spine.  Hypoalbuminemia -Alb 3.0,possibly related to patient's history of cirrhosis. -advised to follow use of protein supplementations. 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 -Globin 7.8, so far no indication for transfusion.  Hypertension -Resume metoprolol tomorrow after dialysis.  Chronic respiratory failure -Secondary to diastolic heart  failure -Continue oxygen supplementation -2 L through nasal cannula at baseline  Chronic diastolic heart failure - volume management through  hemodialysis.  He is still making urine, will give IV Lasix today, to be resumed on Aldactone and Lasix tomorrow after dialysis.   DVT Prophylaxis SCDs given history of esophageal varices  AM Labs Ordered, also please review Full Orders  Family Communication: Admission, patients condition and plan of care including tests being ordered have been discussed with the patient and wife who indicate understanding and agree with the plan and Code Status.  Code Status DNR, but continue with full scope of treatment and medical management  Likely DC to  home  Condition GUARDED    Consults called: renal  Admission status: inpatient   Time spent in minutes : 60 minutes   Phillips Climes M.D on 01/19/2020 at 3:53 PM   [facility] - Office  [phone]

## 2020-01-19 NOTE — Consult Note (Signed)
ESRD Consult Note  Requesting provider: Elgergawy, Silver Huguenin, MD Reason for consult: ESRD, provision of dialysis  Outpatient dialysis unit: [facility] Outpatient dialysis schedule: MWF  Assessment/Recommendations: Jeffrey [name] is a 74 y.o. male with a past medical history notable for ESRD on HD admitted with AMS.   ESRD: Outpatient orders: 4 hrs, edw 109kg, 2k, 2cal, 350/600. No heparin EDW109 kgHD Bath 2K/2.5CaTime 4 hoursHeparin none. AccessLUE AVFand TDC. BFR 350DFR 600 -HD tomorrow (saturday 8/21) and then resume MWF schedule on 8/23 -will need outpatient dialysis records  Acute Encephalopathy -multifactorial likely secondary to pain meds, hypoglycemia, and missed HD -ammonia 37 -mgmt per primary  Volume/ hypertension: EDW 109kg. Attempt to achieve 3-4L UF with HD -agree with one time lasix dose today  Hyponatremia -secondary to liver disease and free water retention. Overall poor prognostic indicator in cirrhosis  Anemia of Chronic Kidney Disease: Hemoglobin 7.8. -per last consult was on epogen 10K units and venofer 100mg  (not sure if he completed this)  Secondary Hyperparathyroidism/Hyperphosphatemia: check phos and cal   Vascular access: tdc and lue avf. Unsure of which is actually being utilized at his outpatient center  Cirrhosis -mgmt per primary service  Additional recommendations: - Dose all meds for creatinine clearance < 10 ml/min  - Unless absolutely necessary, no MRIs with gadolinium.  - Implement save arm precautions.  Prefer needle sticks in the dorsum of the hands or wrists.  No blood pressure measurements in arm. - If blood transfusion is requested during hemodialysis sessions, please alert [place] prior to the session.  - If a hemodialysis catheter line culture is requested, please alert [place] as only hemodialysis nurses are able to collect those specimens.   Recommendations were discussed with the primary team.  [name], MD [facility]  History of Present Illness: Jeffrey [name] is a/an 74 y.o. male with a past medical history of ESRD, hypertension, diabetes mellitus type 2, hyperlipidemia, transfusion dependent anemia, portal hypertensive gastropathy, esophageal varices, alcoholic liver cirrhosis who presents with altered mental status.  He was recently given more pain medication for worsening lower back pain.  His back pain was a limiting factor for him to go to dialysis this last Wednesday therefore his last dialysis was Monday.  He had also stopped taking his lactulose for last 48 hours.  Found to be hypoglycemic here. ROS unobtainable (patient is altered). Data obtained from chart review.   Medications:  No current facility-administered medications for this encounter.   Current Outpatient Medications  Medication Sig Dispense Refill  . Ascorbic Acid (VITAMIN C) 1000 MG tablet Take 1,000 mg by mouth 2 (two) times daily.    . furosemide (LASIX) 40 MG tablet Take 40 mg by mouth 2 (two) times daily.    [name] gabapentin (NEURONTIN) 100 MG capsule TAKE 1 CAPSULE BY MOUTH AT BEDTIME 90 capsule 0  . HUMALOG KWIKPEN 100 UNIT/ML KwikPen as needed. Sliding scale    . HYDROcodone-acetaminophen (NORCO) 10-325 MG tablet Take 1 tablet by mouth every 6 (six) hours as needed for moderate pain. (Patient taking differently: Take 1 tablet by mouth as needed for moderate pain. ) 20 tablet 0  . insulin degludec (TRESIBA FLEXTOUCH) 200 UNIT/ML FlexTouch Pen Inject 30 Units into the skin at bedtime. When blood sugars are up    . iron polysaccharides (NIFEREX) 150 MG capsule Take 150 mg by mouth at bedtime.     [name] lactulose (CHRONULAC) 10 GM/15ML solution Take 45 mLs (30 g total) by mouth 3 (three)  times daily. 236 mL 1  . metoprolol succinate (TOPROL-XL) 25 MG 24 hr tablet Take 1 tablet (25 mg total) by mouth daily. Take at bedtime  on Monday ,Wednesday and Friday due to dialaylisis    . milk thistle 175 MG tablet Take 175 mg by mouth 3  (three) times daily.     . Multiple Vitamin (MULTIVITAMIN WITH MINERALS) TABS tablet Take 1 tablet by mouth in the morning.     . nystatin (MYCOSTATIN/NYSTOP) powder Apply topically 3 (three) times daily. Into groin area and affected area 45 g 0  . omega-3 acid ethyl esters (LOVAZA) 1 g capsule Take 2 g by mouth 2 (two) times daily.     [name] omeprazole (PRILOSEC) 40 MG capsule Take 40 mg by mouth in the morning.     . pravastatin (PRAVACHOL) 20 MG tablet Take 20 mg by mouth in the morning.     . Probiotic Product (PROBIOTIC DAILY PO) Take 1 tablet by mouth in the morning.     . rifaximin (XIFAXAN) 550 MG TABS tablet Take 1 tablet (550 mg total) by mouth 2 (two) times daily. 60 tablet 11  . spironolactone (ALDACTONE) 50 MG tablet Take 50 mg by mouth in the morning.     . tamsulosin (FLOMAX) 0.4 MG CAPS capsule Take 0.4 mg by mouth every evening.    . thiamine 100 MG tablet Take 100 mg by mouth in the morning. Vitamin B-1    . triamcinolone cream (KENALOG) 0.1 % Apply 1 application topically 2 (two) times daily as needed (dry skin Itching).   3  . venlafaxine (EFFEXOR) 75 MG tablet Take 75 mg by mouth 3 (three) times daily with meals.     . vitamin B-12 (CYANOCOBALAMIN) 1000 MCG tablet Take 1,000 mcg by mouth daily.       ALLERGIES Chlorhexidine and Tape  MEDICAL HISTORY Past Medical History:  Diagnosis Date  . A-fib [facility])    when initially stating dialysis january 2021 at [facility] per spouse; never heard anything else about it  . Alcoholic cirrhosis ([place])    patient reports completing hep a and b vaccines in 2006  . Anemia    has had 3 units of prbcs 2013  . Anxiety   . Arthritis   . Asymptomatic gallstones    Ultrasound in 2006  . C. difficile colitis   . Cholelithiasis   . Chronic kidney disease    Dialysis M/W/F/Sa  . Depression   . Diabetes ([place])   . Duodenal ulcer with hemorrhage    per patient in 2006 or 2007, records have been requested  . Dyspnea    low oxygen  level -  has O2 2 L all day  . 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 GERD (gastroesophageal reflux disease)   . Heart burn   . 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 HOH (hard of hearing)   . Hypertension   . Liver cirrhosis ([place])   . Splenomegaly    Ultrasound in 2006     SOCIAL HISTORY Social History   Socioeconomic History  . Marital status: Married    Spouse name: Not on file  . Number of children: 2  . Years of education: Not on file  . Highest education level: Not on file  Occupational History  . Occupation: RETIRED    Employer: SELF EMPLOYED  Tobacco Use  . Smoking status: Former Smoker    Packs/day: 2.00  Years: 25.00    Pack years: 50.00    Types: Cigarettes    Quit date: 01/28/1986    Years since quitting: 33.9  . Smokeless tobacco: Never Used  Vaping Use  . Vaping Use: Never used  Substance and Sexual Activity  . Alcohol use: No    Comment: quit in 05/2013  . Drug use: No  . Sexual activity: Not Currently  Other Topics Concern  . Not on file  Social History Narrative   Two step children from second marriage (divorced) who live with him along with some grandchildren.    Social Determinants of Health   Financial Resource Strain:   . Difficulty of Paying Living Expenses: Not on file  Food Insecurity: No Food Insecurity  . Worried About Charity fundraiser in the Last Year: Never true  . Ran Out of Food in the Last Year: Never true  Transportation Needs: No Transportation Needs  . Lack of Transportation (Medical): No  . Lack of Transportation (Non-Medical): No  Physical Activity:   . Days of Exercise per Week: Not on file  . Minutes of Exercise per Session: Not on file  Stress:   . Feeling of Stress : Not on file  Social Connections:   . Frequency of Communication with Friends and Family: Not on file  . Frequency of Social Gatherings with Friends and Family: Not on file  . Attends Religious Services: Not on file  . Active Member of Clubs  or Organizations: Not on file  . Attends Archivist Meetings: Not on file  . Marital Status: Not on file  Intimate Partner Violence:   . Fear of Current or Ex-Partner: Not on file  . Emotionally Abused: Not on file  . Physically Abused: Not on file  . Sexually Abused: Not on file     FAMILY HISTORY Family History  Problem Relation Age of Onset  . Colon cancer Father 4       deceased age 37  . Breast cancer Sister        deceased  . Diabetes Sister   . Stroke Mother   . Healthy Son   . Healthy Daughter   . Lung cancer Neg Hx   . Ovarian cancer Neg Hx      Review of Systems: Unobtainable secondary to patient's clinical status.  Physical Exam: Vitals:   01/19/20 1317  BP: (!) 168/62  Pulse: 90  Resp: (!) 22  Temp: 98.1 F (36.7 C)  SpO2: 95%   Total I/O In: -  Out: 350 [Urine:350]  Intake/Output Summary (Last 24 hours) at 01/19/2020 1658 Last data filed at 01/19/2020 1300 Gross per 24 hour  Intake --  Output 350 ml  Net -350 ml   General: chronically ill appearing, nad HEENT: icteric sclera, MMM CV: normal rate Lungs: bilateral chest rise, normal wobl, diminished air entry bibasilar Abd: soft, non-tender, distended Skin: ecchymosis over avf. jaundiced MSK: lue avf with +b/t Neuro: awake, follows intermittent commands, not appropriately answering questions, slurs some words    Test Results Reviewed Lab Results  Component Value Date   NA 121 (L) 01/19/2020   K 4.8 01/19/2020   CL 87 (L) 01/19/2020   CO2 20 (L) 01/19/2020   BUN 36 (H) 01/19/2020   CREATININE 4.22 (H) 01/19/2020   CALCIUM 8.6 (L) 01/19/2020   ALBUMIN 2.7 (L) 01/19/2020   PHOS 3.4 01/06/2020    I have reviewed relevant outside healthcare records

## 2020-01-19 NOTE — ED Notes (Signed)
Pt. Given 118 mL orange juice and crackers

## 2020-01-19 NOTE — ED Notes (Signed)
Family updated as to patient's status.

## 2020-01-19 NOTE — ED Triage Notes (Signed)
Wife reports increasing back pain x 1 week, nausea x 3 days; AMS x 1 week; wife reports he is a M, W, F dialysis pt and he missed Wed this week and today; wife reports she feels amonia levels are causing the AMS as pt has not taken lactulose x "maybe 2 days"; reports he was given additional pain meds x 2 days ago at PCP that are not helping

## 2020-01-20 DIAGNOSIS — E1122 Type 2 diabetes mellitus with diabetic chronic kidney disease: Secondary | ICD-10-CM

## 2020-01-20 DIAGNOSIS — Z794 Long term (current) use of insulin: Secondary | ICD-10-CM

## 2020-01-20 LAB — BASIC METABOLIC PANEL
Anion gap: 17 — ABNORMAL HIGH (ref 5–15)
BUN: 41 mg/dL — ABNORMAL HIGH (ref 8–23)
CO2: 21 mmol/L — ABNORMAL LOW (ref 22–32)
Calcium: 8.9 mg/dL (ref 8.9–10.3)
Chloride: 86 mmol/L — ABNORMAL LOW (ref 98–111)
Creatinine, Ser: 4.72 mg/dL — ABNORMAL HIGH (ref 0.61–1.24)
GFR calc Af Amer: 13 mL/min — ABNORMAL LOW (ref >60)
GFR calc non Af Amer: 11 mL/min — ABNORMAL LOW (ref >60)
Glucose, Bld: 51 mg/dL — ABNORMAL LOW (ref 70–99)
Potassium: 4.7 mmol/L (ref 3.5–5.1)
Sodium: 124 mmol/L — ABNORMAL LOW (ref 135–145)

## 2020-01-20 LAB — CBC
HCT: 26.3 % — ABNORMAL LOW (ref 39.0–52.0)
Hemoglobin: 8.1 g/dL — ABNORMAL LOW (ref 13.0–17.0)
MCH: 29.2 pg (ref 26.0–34.0)
MCHC: 30.8 g/dL (ref 30.0–36.0)
MCV: 94.9 fL (ref 80.0–100.0)
Platelets: 119 10*3/uL — ABNORMAL LOW (ref 150–400)
RBC: 2.77 MIL/uL — ABNORMAL LOW (ref 4.22–5.81)
RDW: 17.8 % — ABNORMAL HIGH (ref 11.5–15.5)
WBC: 2.9 10*3/uL — ABNORMAL LOW (ref 4.0–10.5)
nRBC: 0 % (ref 0.0–0.2)

## 2020-01-20 LAB — GLUCOSE, CAPILLARY: Glucose-Capillary: 85 mg/dL (ref 70–99)

## 2020-01-20 LAB — TSH: TSH: 3.238 u[IU]/mL (ref 0.350–4.500)

## 2020-01-20 LAB — VITAMIN D 25 HYDROXY (VIT D DEFICIENCY, FRACTURES): Vit D, 25-Hydroxy: 63.59 ng/mL (ref 30–100)

## 2020-01-20 MED ORDER — INSULIN ASPART 100 UNIT/ML ~~LOC~~ SOLN
0.0000 [IU] | Freq: Three times a day (TID) | SUBCUTANEOUS | Status: DC
  Administered 2020-01-21 – 2020-01-22 (×4): 1 [IU] via SUBCUTANEOUS

## 2020-01-20 MED ORDER — SODIUM CHLORIDE 0.9 % IV SOLN: 100.0000 mL | INTRAVENOUS | Status: DC | PRN

## 2020-01-20 MED ORDER — HYDROCODONE-ACETAMINOPHEN 5-325 MG PO TABS
1.0000 | ORAL_TABLET | Freq: Four times a day (QID) | ORAL | Status: DC | PRN
  Administered 2020-01-20 – 2020-01-22 (×6): 1 via ORAL
  Filled 2020-01-20 (×7): qty 1

## 2020-01-20 MED ORDER — GABAPENTIN 100 MG PO CAPS
100.0000 mg | ORAL_CAPSULE | Freq: Every day | ORAL | Status: DC
  Administered 2020-01-20 – 2020-01-21 (×2): 100 mg via ORAL
  Filled 2020-01-20 (×3): qty 1

## 2020-01-20 MED ORDER — SENNOSIDES-DOCUSATE SODIUM 8.6-50 MG PO TABS: 2.0000 | ORAL_TABLET | Freq: Every evening | ORAL | Status: DC | PRN

## 2020-01-20 MED ORDER — POLYETHYLENE GLYCOL 3350 17 G PO PACK: 17.0000 g | PACK | Freq: Every day | ORAL | Status: DC | PRN

## 2020-01-20 MED ORDER — DEXTROSE 50 % IV SOLN
INTRAVENOUS | Status: AC
  Filled 2020-01-20: qty 50

## 2020-01-20 MED ORDER — IPRATROPIUM-ALBUTEROL 0.5-2.5 (3) MG/3ML IN SOLN: 3.0000 mL | RESPIRATORY_TRACT | Status: DC | PRN

## 2020-01-20 MED ORDER — GUAIFENESIN 100 MG/5ML PO SOLN: 5.0000 mL | ORAL | Status: DC | PRN

## 2020-01-20 MED ORDER — VENLAFAXINE HCL 37.5 MG PO TABS
75.0000 mg | ORAL_TABLET | Freq: Three times a day (TID) | ORAL | Status: DC
  Administered 2020-01-20 – 2020-01-22 (×6): 75 mg via ORAL
  Filled 2020-01-20 (×6): qty 2

## 2020-01-20 MED ORDER — ACETAMINOPHEN 325 MG PO TABS
650.0000 mg | ORAL_TABLET | Freq: Four times a day (QID) | ORAL | Status: DC | PRN
  Administered 2020-01-20: 650 mg via ORAL
  Filled 2020-01-20: qty 2

## 2020-01-20 MED ORDER — ALTEPLASE 2 MG IJ SOLR: 2.0000 mg | Freq: Once | INTRAMUSCULAR | Status: DC | PRN

## 2020-01-20 MED ORDER — SPIRONOLACTONE 25 MG PO TABS
50.0000 mg | ORAL_TABLET | Freq: Every morning | ORAL | Status: DC
  Administered 2020-01-20 – 2020-01-22 (×3): 50 mg via ORAL
  Filled 2020-01-20 (×3): qty 2

## 2020-01-20 MED ORDER — HEPARIN SODIUM (PORCINE) 1000 UNIT/ML DIALYSIS: 1000.0000 [IU] | INTRAMUSCULAR | Status: DC | PRN

## 2020-01-20 NOTE — Procedures (Signed)
I was present at this dialysis session. I have reviewed the session itself and made appropriate changes.   Filed Weights   01/20/20 0117 01/20/20 0528 01/20/20 1250  Weight: 105.8 kg 108.5 kg 106 kg    Recent Labs  Lab 01/20/20 0838  NA 124*  K 4.7  CL 86*  CO2 21*  GLUCOSE 51*  BUN 41*  CREATININE 4.72*  CALCIUM 8.9    Recent Labs  Lab 01/19/20 1351 01/20/20 0838  WBC 3.5* 2.9*  NEUTROABS 3.0  --   HGB 7.8* 8.1*  HCT 24.5* 26.3*  MCV 95.0 94.9  PLT 112* 119*    Scheduled Meds: . vitamin C  1,000 mg Oral BID  . furosemide  40 mg Oral BID  . gabapentin  100 mg Oral QHS  . insulin aspart  0-6 Units Subcutaneous TID WC  . iron polysaccharides  150 mg Oral QHS  . lactulose  30 g Oral TID  . [START ON 01/22/2020] metoprolol succinate  25 mg Oral Q M,W,F-2000  . multivitamin with minerals  1 tablet Oral q AM  . pantoprazole  40 mg Oral Daily  . pravastatin  20 mg Oral q AM  . rifaximin  550 mg Oral BID  . spironolactone  50 mg Oral q AM  . tamsulosin  0.4 mg Oral QPM  . thiamine  100 mg Oral q AM  . venlafaxine  75 mg Oral TID WC  . vitamin B-12  1,000 mcg Oral Daily   Continuous Infusions: . sodium chloride    . sodium chloride     PRN Meds:.sodium chloride, sodium chloride, acetaminophen, alteplase, guaiFENesin, heparin, HYDROcodone-acetaminophen, ipratropium-albuterol, polyethylene glycol, senna-docusate   [name], MD [facility] 01/20/2020, 3:14 PM

## 2020-01-20 NOTE — Procedures (Signed)
    HEMODIALYSIS TREATMENT NOTE:   4 hour heparin-free HD completed via RIJ TDC.  AVF still has small soft hematoma and bruising from incenter infiltration.  Catheter utilized today; exit site is unremarkable.  Goal met: 3.7 liters removed with 15 minutes of interrupted UF time.  All blood was returned.  No changes from pre-HD assessment.  Rockwell Alexandria, RN

## 2020-01-20 NOTE — Progress Notes (Signed)
[facility] Progress Note    Assessment/ Plan:   1. Encephalopathy -secondary to pain medications, hypoglycemia, missing HD -mentation much clearer and better right now 2. ESRD on HD MWF, HD today to make up for missed session, plan for HD again on Mon 8/23 if patient is still here 3. Hyponatremia, slightly improved -overall poor prognostic indicator in the setting of cirrhosis. UF as tolerated 4. Hypoalbuminemia -protein supplementation 5. Anemia of chronic kidney disease - hgb slightly better at 8.1, typically run around this range 6. CKD-MBD/secondary hyperparathyroidism -monitor phos and cal 7. ESLD/alcoholic cirrhosis 8. DM2 with hypoglycemic episodes -mgmt per primary service  Outpatient Orders (per last admission) 4hrs, edew 109kg, 2k, 2cal, 350/600, lue avf and tdc  Patient will not be seen over the weekend, call with specific questions.  [name], MD [facility]  Subjective:   Mentation much better, interactive. Feels well no complaints. Seen on hd, tolerating treatment.   Objective:   BP (!) 143/70   Pulse (!) 102   Temp 98.1 F (36.7 C) (Oral)   Resp 16   Ht 5\' 7"  (1.702 m)   Wt 106 kg   SpO2 97%   BMI 36.60 kg/m   Intake/Output Summary (Last 24 hours) at 01/20/2020 1506 Last data filed at 01/20/2020 0900 Gross per 24 hour  Intake 240 ml  Output 1700 ml  Net -1460 ml   Weight change:   Physical Exam: Gen:nad CVS:reg rate Resp:normal wob, bl chest expansion HGD:JMEQASTMH, soft DQQ:IWLNL edema Access: hematoma/ecchymosis over lue avf, tdc c/d/i  Imaging: DG Thoracic Spine W/Swimmers  Result Date: 01/19/2020 CLINICAL DATA:  Worsening thoracic spine pain over the past week. No known injury. EXAM: THORACIC SPINE - 3 VIEWS COMPARISON:  None. FINDINGS: No fracture or malalignment. Scattered anterior endplate spurring is worst in the lower thoracic spine. Paraspinous structures demonstrate no acute abnormality.  IMPRESSION: No acute finding. Scattered thoracic degenerative disease. Electronically Signed   By: Inge Rise M.D.   On: 01/19/2020 17:20   DG Lumbar Spine Complete  Result Date: 01/19/2020 CLINICAL DATA:  Worsening low back pain over the past week. EXAM: LUMBAR SPINE - COMPLETE 4+ VIEW COMPARISON:  None. FINDINGS: No fracture or malalignment. Facet degenerative disease is severe at L5-S1. There is some anterior endplate spurring and mild loss of disc space height in the visualized lower thoracic spine and upper lumbar spine. Paraspinous structures demonstrate extensive atherosclerosis. IMPRESSION: No acute abnormality. 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 Facet arthropathy is severe at L5-S1. Aortic Atherosclerosis (ICD10-I70.0). Electronically Signed   By: Inge Rise M.D.   On: 01/19/2020 17:18   DG Chest Portable 1 View  Result Date: 01/19/2020 CLINICAL DATA:  Shortness of breath.  Missed dialysis EXAM: PORTABLE CHEST 1 VIEW COMPARISON:  01/01/2020 FINDINGS: Stable right IJ approach hemodialysis catheter. Cardiomegaly. Pulmonary vascular congestion with increased interstitial markings bilaterally. Suspect small left pleural effusion. No pneumothorax. IMPRESSION: 1. Cardiomegaly with pulmonary vascular congestion and interstitial edema. 2. Suspect small left pleural effusion. Electronically Signed   By: Davina Poke D.O.   On: 01/19/2020 14:36   DG Abd Portable 1V  Result Date: 01/19/2020 CLINICAL DATA:  74 year old male with back pain.  Nausea. EXAM: PORTABLE ABDOMEN - 1 VIEW COMPARISON:  CT abdomen pelvis dated 01/01/2020. FINDINGS: Evaluation is limited due to body habitus. An air-filled loop of bowel in the right hemiabdomen measuring approximately 3.7 cm in caliber, likely a small-bowel. There is moderate amount of stool throughout the colon.  No free air. Degenerative changes of the spine and hips. No acute osseous pathology. IMPRESSION: 1. Mildly dilated  small bowel loop in the right hemiabdomen may represent ileus or early obstruction. Clinical correlation recommended. 2. Moderate amount of stool throughout the colon. Electronically Signed   By: Anner Crete M.D.   On: 01/19/2020 16:52    Labs: BMET Recent Labs  Lab 01/19/20 1351 01/20/20 0838  NA 121* 124*  K 4.8 4.7  CL 87* 86*  CO2 20* 21*  GLUCOSE 59* 51*  BUN 36* 41*  CREATININE 4.22* 4.72*  CALCIUM 8.6* 8.9   CBC Recent Labs  Lab 01/19/20 1351 01/20/20 0838  WBC 3.5* 2.9*  NEUTROABS 3.0  --   HGB 7.8* 8.1*  HCT 24.5* 26.3*  MCV 95.0 94.9  PLT 112* 119*    Medications:    . vitamin C  1,000 mg Oral BID  . furosemide  40 mg Oral BID  . gabapentin  100 mg Oral QHS  . insulin aspart  0-6 Units Subcutaneous TID WC  . iron polysaccharides  150 mg Oral QHS  . lactulose  30 g Oral TID  . [START ON 01/22/2020] metoprolol succinate  25 mg Oral Q M,W,F-2000  . multivitamin with minerals  1 tablet Oral q AM  . pantoprazole  40 mg Oral Daily  . pravastatin  20 mg Oral q AM  . rifaximin  550 mg Oral BID  . spironolactone  50 mg Oral q AM  . tamsulosin  0.4 mg Oral QPM  . thiamine  100 mg Oral q AM  . venlafaxine  75 mg Oral TID WC  . vitamin B-12  1,000 mcg Oral Daily      [name], MD [facility] 01/20/2020, 3:06 PM

## 2020-01-20 NOTE — Progress Notes (Signed)
PROGRESS NOTE    Jeffrey [name]  KTG:[id number] DOB: 04/04/1946 DOA: 01/19/2020 PCP: Monico Blitz, MD   Brief Narrative:  74 y.o. male, with medical history significant foralcoholic liver cirrhosis, ESRD(MWF), hypertension, diabetes mellitus type 2, hyperlipidemia, transfusion dependent anemia, portal hypertensive gastropathy, esophageal varicesrecent hospitalization earlier this month due to hepatic encephalopathy due to hyperammonemia and hypoglycemia,   Assessment & Plan:   Active Problems:   Alcoholic cirrhosis (HCC)   Edema   Type 2 diabetes mellitus (HCC)   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[place])   Acute hepatic encephalopathy   Encephalopathy acute   Hyponatremia   Hypoglycemia   Hyperammonemia (HCC)   Hepatic encephalopathy (HCC)  Acute Hepatic encephalopathy/generalized weakness/deconditioning -This is multifactorial, in the setting of hypoglycemia, and missing hemodialysis.  Possibly due to recently increased pain medications. -PT/OT  -Nephro consulted  -Lactulose titrate to 3-4 soft bm daily.  -Cont Accuchecks  ESRD on hemodialysis Monday Wednesday Friday -Missed sessions outpatient -Nephro consulted for HD  Diabetes mellitus, type II, poorly controlled with recurrent hypoglycemia -A1c during recent admission 5.9, Home long acting held -Added Accuchecks -low sensitive ISS.   Hyponatremia -Due to vol overload. 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 -Hb 7.8. pending am labs.   Essential Hypertension -Resume metoprolol tomorrow after dialysis.  Chronic respiratory failure on 2L [place] at home.  -Secondary to diastolic heart failure -Continue  oxygen supplementation; IS/ Flutter -2 L through nasal cannula at baseline  Chronic diastolic heart failure -Echo 12/2019= Ef 70%, supportive care. He is going to HD -Plan to resume Lasix, BB and Aldactone post HD.   BPH -flomax   HLD -Pravastatin    DVT prophylaxis: SCDs Start: 01/19/20 2115 Code Status: DNR Family Communication:    Status is: Inpatient  Remains inpatient appropriate because:Inpatient level of care appropriate due to severity of illness   Dispo: The patient is from: Home              Anticipated d/c is to: Home              Anticipated d/c date is: 2 days              Patient currently is not medically stable to d/c.  Patient is still volume overloaded requiring inpatient dialysis which should also help with his electrolytes.  Also hypoglycemic this morning, unsafe for discharge       Body mass index is 37.46 kg/m.         Consultants:   Nephro   Subjective: Patient has some exertional dyspnea and cough.  Had episode of hypoglycemia this morning which resolved after p.o. intake.  Review of Systems Otherwise negative except as per HPI, including: General: Denies fever, chills, night sweats or unintended weight loss. Resp: Denies cough, wheezing, shortness of breath. Cardiac: Denies chest pain, palpitations, orthopnea, paroxysmal nocturnal dyspnea. GI: Denies abdominal pain, nausea, vomiting, diarrhea or constipation GU: Denies dysuria, frequency, hesitancy or incontinence MS: Denies muscle aches, joint pain or swelling Neuro: Denies headache, neurologic deficits (focal weakness, numbness, tingling), abnormal gait Psych: Denies anxiety, depression, SI/HI/AVH Skin: Denies new rashes or lesions ID: Denies sick contacts, exotic exposures, travel  Examination:  General exam: Appears calm and  comfortable  Respiratory system: Mild bibasilar crackles Cardiovascular system: S1 & S2 heard, RRR. No JVD, murmurs, rubs, gallops or clicks. No  pedal edema. Gastrointestinal system: Abdomen is nondistended, soft and nontender. No organomegaly or masses felt. Normal bowel sounds heard. Central nervous system: Alert and oriented. No focal neurological deficits. Extremities: Symmetric 5 x 5 power. Skin: No rashes, lesions or ulcers Psychiatry: Judgement and insight appear normal. Mood & affect appropriate.   Left upper extremity fistula in place  Objective: Vitals:   01/19/20 2030 01/19/20 2118 01/20/20 0117 01/20/20 0528  BP: (!) 152/71 (!) 153/65 (!) 160/68 (!) 156/73  Pulse: 90 91 87 98  Resp: 15 20 18 20   Temp:  97.8 F (36.6 C) (!) 97.5 F (36.4 C) 98.2 F (36.8 C)  TempSrc:  Oral Axillary Oral  SpO2: 100% 97% 98% 98%  Weight:   105.8 kg 108.5 kg  Height:        Intake/Output Summary (Last 24 hours) at 01/20/2020 0753 Last data filed at 01/20/2020 0500 Gross per 24 hour  Intake --  Output 2050 ml  Net -2050 ml   Filed Weights   01/19/20 1317 01/20/20 0117 01/20/20 0528  Weight: 100.7 kg 105.8 kg 108.5 kg     Data Reviewed:   CBC: Recent Labs  Lab 01/19/20 1351  WBC 3.5*  NEUTROABS 3.0  HGB 7.8*  HCT 24.5*  MCV 95.0  PLT 811*   Basic Metabolic Panel: Recent Labs  Lab 01/19/20 1351  NA 121*  K 4.8  CL 87*  CO2 20*  GLUCOSE 59*  BUN 36*  CREATININE 4.22*  CALCIUM 8.6*   GFR: Estimated Creatinine Clearance: 18.6 mL/min (A) (by C-G formula based on SCr of 4.22 mg/dL (H)). Liver Function Tests: Recent Labs  Lab 01/19/20 1351  AST 25  ALT 27  ALKPHOS 304*  BILITOT 1.0  PROT 7.3  ALBUMIN 2.7*   No results for input(s): LIPASE, AMYLASE in the last 168 hours. Recent Labs  Lab 01/19/20 1351  AMMONIA 37*   Coagulation Profile: Recent Labs  Lab 01/19/20 1351  INR 1.0   Cardiac Enzymes: No results for input(s): CKTOTAL, CKMB, CKMBINDEX, TROPONINI in the last 168 hours. BNP (last 3 results) No results for input(s): PROBNP in the last 8760 hours. HbA1C: No results for input(s):  HGBA1C in the last 72 hours. CBG: Recent Labs  Lab 01/19/20 1546 01/19/20 1631 01/19/20 1717  GLUCAP 43* 46* 92   Lipid Profile: No results for input(s): CHOL, HDL, LDLCALC, TRIG, CHOLHDL, LDLDIRECT in the last 72 hours. Thyroid Function Tests: No results for input(s): TSH, T4TOTAL, FREET4, T3FREE, THYROIDAB in the last 72 hours. Anemia Panel: No results for input(s): VITAMINB12, FOLATE, FERRITIN, TIBC, IRON, RETICCTPCT in the last 72 hours. Sepsis Labs: No results for input(s): PROCALCITON, LATICACIDVEN in the last 168 hours.  Recent Results (from the past 240 hour(s))  SARS Coronavirus 2 by RT PCR (hospital order, performed in [facility] hospital lab) Nasopharyngeal Nasopharyngeal Swab     Status: None   Collection Time: 01/19/20  3:06 PM   Specimen: Nasopharyngeal Swab  Result Value Ref Range Status   SARS Coronavirus 2 NEGATIVE NEGATIVE Final    Comment: (NOTE) SARS-CoV-2 target nucleic acids are NOT DETECTED.  The SARS-CoV-2 RNA is generally detectable in upper and lower respiratory specimens during the acute phase of infection. The lowest concentration of SARS-CoV-2 viral copies this assay can detect is 250 copies / mL. A negative result does not preclude SARS-CoV-2 infection and  should not be used as the sole basis for treatment or other patient management decisions.  A negative result may occur with improper specimen collection / handling, submission of specimen other than nasopharyngeal swab, presence of viral mutation(s) within the areas targeted by this assay, and inadequate number of viral copies (<250 copies / mL). A negative result must be combined with clinical observations, patient history, and epidemiological information.  Fact Sheet for Patients:   StrictlyIdeas.no  Fact Sheet for Healthcare Providers: BankingDealers.co.za  This test is not yet approved or  cleared by the [place] FDA and has been  authorized for detection and/or diagnosis of SARS-CoV-2 by FDA under an Emergency Use Authorization (EUA).  This EUA will remain in effect (meaning this test can be used) for the duration of the COVID-19 declaration under Section 564(b)(1) of the Act, 21 U.S.C. section 360bbb-3(b)(1), unless the authorization is terminated or revoked sooner.  Performed at [facility], [address] [place], [place] [address]          Radiology Studies: DG Thoracic Spine W/Swimmers  Result Date: 01/19/2020 CLINICAL DATA:  Worsening thoracic spine pain over the past week. No known injury. EXAM: THORACIC SPINE - 3 VIEWS COMPARISON:  None. FINDINGS: No fracture or malalignment. Scattered anterior endplate spurring is worst in the lower thoracic spine. Paraspinous structures demonstrate no acute abnormality. IMPRESSION: No acute finding. Scattered thoracic degenerative disease. Electronically Signed   By: Inge Rise M.D.   On: 01/19/2020 17:20   DG Lumbar Spine Complete  Result Date: 01/19/2020 CLINICAL DATA:  Worsening low back pain over the past week. EXAM: LUMBAR SPINE - COMPLETE 4+ VIEW COMPARISON:  None. FINDINGS: No fracture or malalignment. Facet degenerative disease is severe at L5-S1. There is some anterior endplate spurring and mild loss of disc space height in the visualized lower thoracic spine and upper lumbar spine. Paraspinous structures demonstrate extensive atherosclerosis. IMPRESSION: No acute abnormality. 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 Facet arthropathy is severe at L5-S1. Aortic Atherosclerosis (ICD10-I70.0). Electronically Signed   By: Inge Rise M.D.   On: 01/19/2020 17:18   DG Chest Portable 1 View  Result Date: 01/19/2020 CLINICAL DATA:  Shortness of breath.  Missed dialysis EXAM: PORTABLE CHEST 1 VIEW COMPARISON:  01/01/2020 FINDINGS: Stable right IJ approach hemodialysis catheter. Cardiomegaly. Pulmonary vascular congestion with increased  interstitial markings bilaterally. Suspect small left pleural effusion. No pneumothorax. IMPRESSION: 1. Cardiomegaly with pulmonary vascular congestion and interstitial edema. 2. Suspect small left pleural effusion. Electronically Signed   By: Davina Poke D.O.   On: 01/19/2020 14:36   DG Abd Portable 1V  Result Date: 01/19/2020 CLINICAL DATA:  74 year old male with back pain.  Nausea. EXAM: PORTABLE ABDOMEN - 1 VIEW COMPARISON:  CT abdomen pelvis dated 01/01/2020. FINDINGS: Evaluation is limited due to body habitus. An air-filled loop of bowel in the right hemiabdomen measuring approximately 3.7 cm in caliber, likely a small-bowel. There is moderate amount of stool throughout the colon. No free air. Degenerative changes of the spine and hips. No acute osseous pathology. IMPRESSION: 1. Mildly dilated small bowel loop in the right hemiabdomen may represent ileus or early obstruction. Clinical correlation recommended. 2. Moderate amount of stool throughout the colon. Electronically Signed   By: Anner Crete M.D.   On: 01/19/2020 16:52        Scheduled Meds: . vitamin C  1,000 mg Oral BID  . furosemide  40 mg Oral BID  . insulin aspart  0-6 Units Subcutaneous TID  WC  . iron polysaccharides  150 mg Oral QHS  . lactulose  30 g Oral TID  . [START ON 01/22/2020] metoprolol succinate  25 mg Oral Q M,W,F-2000  . multivitamin with minerals  1 tablet Oral q AM  . pantoprazole  40 mg Oral Daily  . pravastatin  20 mg Oral q AM  . rifaximin  550 mg Oral BID  . tamsulosin  0.4 mg Oral QPM  . thiamine  100 mg Oral q AM  . vitamin B-12  1,000 mcg Oral Daily   Continuous Infusions:   LOS: 1 day   Time spent=35* mins    Jameire Kouba Arsenio Loader, MD [facility]  If 7PM-7AM, please contact night-coverage  01/20/2020, 7:53 AM

## 2020-01-20 NOTE — Progress Notes (Signed)
Hypoglycemic Event  CBG: 26  Treatment: D50 50 mL (25 gm)  Symptoms: None  Follow-up CBG: Time:1000 CBG Result:176  Possible Reasons for Event: Unknown  Comments/MD notified:Dr. Reesa Chew notified. Patient given multiple cups of juice with no significant rise in blood sugar. Patient also ate breakfast with CBG only coming up to 61. D50 administered with rise in blood sugar to 176. Results did not transmit from glucometer.    Dorina Hoyer

## 2020-01-21 ENCOUNTER — Inpatient Hospital Stay (HOSPITAL_COMMUNITY): Payer: Medicare Other

## 2020-01-21 DIAGNOSIS — K7200 Acute and subacute hepatic failure without coma: Secondary | ICD-10-CM

## 2020-01-21 LAB — GLUCOSE, CAPILLARY: Glucose-Capillary: 170 mg/dL — ABNORMAL HIGH (ref 70–99)

## 2020-01-21 LAB — CBC
HCT: 23.1 % — ABNORMAL LOW (ref 39.0–52.0)
Hemoglobin: 7 g/dL — ABNORMAL LOW (ref 13.0–17.0)
MCH: 28.8 pg (ref 26.0–34.0)
MCHC: 30.3 g/dL (ref 30.0–36.0)
MCV: 95.1 fL (ref 80.0–100.0)
Platelets: 103 10*3/uL — ABNORMAL LOW (ref 150–400)
RBC: 2.43 MIL/uL — ABNORMAL LOW (ref 4.22–5.81)
RDW: 17.7 % — ABNORMAL HIGH (ref 11.5–15.5)
WBC: 2.9 10*3/uL — ABNORMAL LOW (ref 4.0–10.5)
nRBC: 0 % (ref 0.0–0.2)

## 2020-01-21 LAB — BASIC METABOLIC PANEL
Anion gap: 11 (ref 5–15)
BUN: 21 mg/dL (ref 8–23)
CO2: 25 mmol/L (ref 22–32)
Calcium: 8.4 mg/dL — ABNORMAL LOW (ref 8.9–10.3)
Chloride: 89 mmol/L — ABNORMAL LOW (ref 98–111)
Creatinine, Ser: 2.77 mg/dL — ABNORMAL HIGH (ref 0.61–1.24)
GFR calc Af Amer: 25 mL/min — ABNORMAL LOW (ref >60)
GFR calc non Af Amer: 22 mL/min — ABNORMAL LOW (ref >60)
Glucose, Bld: 187 mg/dL — ABNORMAL HIGH (ref 70–99)
Potassium: 3.7 mmol/L (ref 3.5–5.1)
Sodium: 125 mmol/L — ABNORMAL LOW (ref 135–145)

## 2020-01-21 LAB — AMMONIA: Ammonia: 29 umol/L (ref 9–35)

## 2020-01-21 LAB — MAGNESIUM: Magnesium: 1.6 mg/dL — ABNORMAL LOW (ref 1.7–2.4)

## 2020-01-21 NOTE — Progress Notes (Signed)
PROGRESS NOTE    BURTIS IMHOFF  VQQ:[id number] DOB: 1946/04/04 DOA: 01/19/2020 PCP: Monico Blitz, MD   Brief Narrative:  74 y.o. male, with medical history significant foralcoholic liver cirrhosis, ESRD(MWF), hypertension, diabetes mellitus type 2, hyperlipidemia, transfusion dependent anemia, portal hypertensive gastropathy, esophageal varicesrecent hospitalization earlier this month due to hepatic encephalopathy due to hyperammonemia and hypoglycemia.  Encephalopathy is slowly improving, tolerating lactulose and having 3 bowel movements over last 24 hours.   Assessment & Plan:   Active Problems:   Alcoholic cirrhosis (HCC)   Edema   Type 2 diabetes mellitus (HCC)   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[place])   Acute hepatic encephalopathy   Encephalopathy acute   Hyponatremia   Hypoglycemia   Hyperammonemia (HCC)   Hepatic encephalopathy (HCC)  Acute Hepatic encephalopathy/generalized weakness/deconditioning, improved -This is multifactorial, in the setting of hypoglycemia, and missing hemodialysis.  Possibly due to recently increased pain medications. -PT/OT-pending -Nephro consulted-appreciate input -Lactulose titrate to 3-4 soft bm daily.  -Cont Accuchecks  ESRD on hemodialysis Monday Wednesday Friday -Missed sessions outpatient -Nephro consulted for HD  Low back pain -Lumbar and thoracic x-ray ordered  Diabetes mellitus, type II, poorly controlled -A1c during recent admission 5.9, home meds on hold. -Continue Accu-Cheks, hypoglycemic episodes somewhat improved. -low sensitive ISS.   Hyponatremia -Due to vol overload. 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 -Hemoglobin this morning 7.0, may be tomorrow with dialysis he can get 1 more unit of PRBC  Essential Hypertension -Resume metoprolol tomorrow after dialysis.  Chronic respiratory failure on 2L [place] at home.  -Secondary to diastolic heart failure -Continue oxygen supplementation; IS/ Flutter -2 L through nasal cannula at baseline  Chronic diastolic heart failure -Echo 12/2019= Ef 70%, supportive care. He is going to HD -Plan to resume Lasix, BB and Aldactone post HD.   BPH -flomax   HLD -Pravastatin    DVT prophylaxis: SCDs Start: 01/19/20 2115 Code Status: DNR Family Communication:    Status is: Inpatient  Remains inpatient appropriate because:Inpatient level of care appropriate due to severity of illness   Dispo: The patient is from: Home              Anticipated d/c is to: Home              Anticipated d/c date is: 1 day              Patient currently is not medically stable to d/c.  Maintain hospital stay for PT/OT evaluation, ensure he is tolerating orals without any issues.  Plans for HD tomorrow with 1 unit PRBC transfusion if possible.  Hopefully we can discharge him tomorrow   Body mass index is 35.36 kg/m.         Consultants:   Nephro   Subjective: Feels well better this morning reporting of quite a bit of low back pain.  Poor intake is better but not adequate yet.  Review of Systems Otherwise negative except as per HPI, including: General: Denies fever, chills, night sweats or unintended weight loss. Resp: Denies cough, wheezing, shortness of breath. Cardiac: Denies chest pain, palpitations, orthopnea, paroxysmal nocturnal dyspnea. GI: Denies abdominal pain, nausea, vomiting, diarrhea or constipation GU: Denies dysuria, frequency, hesitancy  or incontinence MS: Denies  swelling Neuro: Denies headache, neurologic deficits (focal weakness, numbness, tingling), abnormal gait Psych: Denies anxiety, depression, SI/HI/AVH Skin: Denies new  rashes or lesions ID: Denies sick contacts, exotic exposures, travel Examination:  Constitutional: Not in acute distress Respiratory: Clear to auscultation bilaterally Cardiovascular: Normal sinus rhythm, no rubs Abdomen: Nontender nondistended good bowel sounds Musculoskeletal: No edema noted Skin: No rashes seen Neurologic: CN 2-12 grossly intact.  And nonfocal Psychiatric: Normal judgment and insight. Alert and oriented x 3. Normal mood. Left upper extremity fistula in place  Objective: Vitals:   01/20/20 1715 01/20/20 2030 01/20/20 2201 01/21/20 0437  BP: 122/65 (!) 146/57  (!) 114/48  Pulse: 100 (!) 106 (!) 108 (!) 106  Resp: 16 16  16   Temp: 98 F (36.7 C) 98.2 F (36.8 C)  98 F (36.7 C)  TempSrc: Oral Oral  Oral  SpO2: 98% (!) 88% 94% 98%  Weight: 102.4 kg     Height:        Intake/Output Summary (Last 24 hours) at 01/21/2020 1054 Last data filed at 01/20/2020 1700 Gross per 24 hour  Intake --  Output 5525 ml  Net -5525 ml   Filed Weights   01/20/20 0528 01/20/20 1250 01/20/20 1715  Weight: 108.5 kg 106 kg 102.4 kg     Data Reviewed:   CBC: Recent Labs  Lab 01/19/20 1351 01/20/20 0838 01/21/20 0724  WBC 3.5* 2.9* 2.9*  NEUTROABS 3.0  --   --   HGB 7.8* 8.1* 7.0*  HCT 24.5* 26.3* 23.1*  MCV 95.0 94.9 95.1  PLT 112* 119* 086*   Basic Metabolic Panel: Recent Labs  Lab 01/19/20 1351 01/20/20 0838 01/21/20 0724  NA 121* 124* 125*  K 4.8 4.7 3.7  CL 87* 86* 89*  CO2 20* 21* 25  GLUCOSE 59* 51* 187*  BUN 36* 41* 21  CREATININE 4.22* 4.72* 2.77*  CALCIUM 8.6* 8.9 8.4*  MG  --   --  1.6*   GFR: Estimated Creatinine Clearance: 27.5 mL/min (A) (by C-G formula based on SCr of 2.77 mg/dL (H)). Liver Function Tests: Recent Labs  Lab 01/19/20 1351  AST 25  ALT 27  ALKPHOS 304*  BILITOT 1.0  PROT 7.3  ALBUMIN 2.7*   No results for input(s): LIPASE, AMYLASE in the last 168 hours. Recent Labs  Lab 01/19/20 1351 01/21/20 0724  AMMONIA 37*  29   Coagulation Profile: Recent Labs  Lab 01/19/20 1351  INR 1.0   Cardiac Enzymes: No results for input(s): CKTOTAL, CKMB, CKMBINDEX, TROPONINI in the last 168 hours. BNP (last 3 results) No results for input(s): PROBNP in the last 8760 hours. HbA1C: No results for input(s): HGBA1C in the last 72 hours. CBG: Recent Labs  Lab 01/19/20 1546 01/19/20 1631 01/19/20 1717 01/20/20 1632  GLUCAP 43* 46* 92 85   Lipid Profile: No results for input(s): CHOL, HDL, LDLCALC, TRIG, CHOLHDL, LDLDIRECT in the last 72 hours. Thyroid Function Tests: Recent Labs    01/20/20 0838  TSH 3.238   Anemia Panel: No results for input(s): VITAMINB12, FOLATE, FERRITIN, TIBC, IRON, RETICCTPCT in the last 72 hours. Sepsis Labs: No results for input(s): PROCALCITON, LATICACIDVEN in the last 168 hours.  Recent Results (from the past 240 hour(s))  SARS Coronavirus 2 by RT PCR (hospital order, performed in [facility] hospital lab) Nasopharyngeal Nasopharyngeal Swab     Status: None   Collection Time: 01/19/20  3:06 PM   Specimen: Nasopharyngeal Swab  Result Value Ref Range  Status   SARS Coronavirus 2 NEGATIVE NEGATIVE Final    Comment: (NOTE) SARS-CoV-2 target nucleic acids are NOT DETECTED.  The SARS-CoV-2 RNA is generally detectable in upper and lower respiratory specimens during the acute phase of infection. The lowest concentration of SARS-CoV-2 viral copies this assay can detect is 250 copies / mL. A negative result does not preclude SARS-CoV-2 infection and should not be used as the sole basis for treatment or other patient management decisions.  A negative result may occur with improper specimen collection / handling, submission of specimen other than nasopharyngeal swab, presence of viral mutation(s) within the areas targeted by this assay, and inadequate number of viral copies (<250 copies / mL). A negative result must be combined with clinical observations, patient history, and  epidemiological information.  Fact Sheet for Patients:   StrictlyIdeas.no  Fact Sheet for Healthcare Providers: BankingDealers.co.za  This test is not yet approved or  cleared by the [place] FDA and has been authorized for detection and/or diagnosis of SARS-CoV-2 by FDA under an Emergency Use Authorization (EUA).  This EUA will remain in effect (meaning this test can be used) for the duration of the COVID-19 declaration under Section 564(b)(1) of the Act, 21 U.S.C. section 360bbb-3(b)(1), unless the authorization is terminated or revoked sooner.  Performed at [facility], [address] [place], [place] [address]          Radiology Studies: DG Thoracic Spine W/Swimmers  Result Date: 01/19/2020 CLINICAL DATA:  Worsening thoracic spine pain over the past week. No known injury. EXAM: THORACIC SPINE - 3 VIEWS COMPARISON:  None. FINDINGS: No fracture or malalignment. Scattered anterior endplate spurring is worst in the lower thoracic spine. Paraspinous structures demonstrate no acute abnormality. IMPRESSION: No acute finding. Scattered thoracic degenerative disease. Electronically Signed   By: Inge Rise M.D.   On: 01/19/2020 17:20   DG Lumbar Spine Complete  Result Date: 01/19/2020 CLINICAL DATA:  Worsening low back pain over the past week. EXAM: LUMBAR SPINE - COMPLETE 4+ VIEW COMPARISON:  None. FINDINGS: No fracture or malalignment. Facet degenerative disease is severe at L5-S1. There is some anterior endplate spurring and mild loss of disc space height in the visualized lower thoracic spine and upper lumbar spine. Paraspinous structures demonstrate extensive atherosclerosis. IMPRESSION: No acute abnormality. 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 Facet arthropathy is severe at L5-S1. Aortic Atherosclerosis (ICD10-I70.0). Electronically Signed   By: Inge Rise M.D.   On: 01/19/2020 17:18   DG Chest  Portable 1 View  Result Date: 01/19/2020 CLINICAL DATA:  Shortness of breath.  Missed dialysis EXAM: PORTABLE CHEST 1 VIEW COMPARISON:  01/01/2020 FINDINGS: Stable right IJ approach hemodialysis catheter. Cardiomegaly. Pulmonary vascular congestion with increased interstitial markings bilaterally. Suspect small left pleural effusion. No pneumothorax. IMPRESSION: 1. Cardiomegaly with pulmonary vascular congestion and interstitial edema. 2. Suspect small left pleural effusion. Electronically Signed   By: Davina Poke D.O.   On: 01/19/2020 14:36   DG Abd Portable 1V  Result Date: 01/19/2020 CLINICAL DATA:  74 year old male with back pain.  Nausea. EXAM: PORTABLE ABDOMEN - 1 VIEW COMPARISON:  CT abdomen pelvis dated 01/01/2020. FINDINGS: Evaluation is limited due to body habitus. An air-filled loop of bowel in the right hemiabdomen measuring approximately 3.7 cm in caliber, likely a small-bowel. There is moderate amount of stool throughout the colon. No free air. Degenerative changes of the spine and hips. No acute osseous pathology. IMPRESSION: 1. Mildly dilated small bowel loop in the right hemiabdomen  may represent ileus or early obstruction. Clinical correlation recommended. 2. Moderate amount of stool throughout the colon. Electronically Signed   By: Anner Crete M.D.   On: 01/19/2020 16:52        Scheduled Meds: . vitamin C  1,000 mg Oral BID  . furosemide  40 mg Oral BID  . gabapentin  100 mg Oral QHS  . insulin aspart  0-6 Units Subcutaneous TID WC  . iron polysaccharides  150 mg Oral QHS  . lactulose  30 g Oral TID  . [START ON 01/22/2020] metoprolol succinate  25 mg Oral Q M,W,F-2000  . multivitamin with minerals  1 tablet Oral q AM  . pantoprazole  40 mg Oral Daily  . pravastatin  20 mg Oral q AM  . rifaximin  550 mg Oral BID  . spironolactone  50 mg Oral q AM  . tamsulosin  0.4 mg Oral QPM  . thiamine  100 mg Oral q AM  . venlafaxine  75 mg Oral TID WC  . vitamin B-12   1,000 mcg Oral Daily   Continuous Infusions: . sodium chloride    . sodium chloride       LOS: 2 days   Time spent=35 mins    Torin Modica Arsenio Loader, MD [facility]  If 7PM-7AM, please contact night-coverage  01/21/2020, 10:54 AM

## 2020-01-21 NOTE — Progress Notes (Signed)
[name] Progress Note    Assessment/ Plan:   1. Encephalopathy -secondary to pain medications, hypoglycemia, missing HD -mentation much clearer and better right now 2. ESRD on HD MWF,  plan for HD again on Mon 8/23 if patient is still here 3. Hyponatremia, slightly improved -overall poor prognostic indicator in the setting of cirrhosis. UF as tolerated 4. Hypoalbuminemia -protein supplementation 5. Anemia of chronic kidney disease - hgb down to 7 this am. Need outpatient records on ESA/iron. Now sure when he received his last dose. R/o bleed? 6. CKD-MBD/secondary hyperparathyroidism -monitor phos and cal 7. ESLD/alcoholic cirrhosis 8. DM2 with hypoglycemic episodes -mgmt per primary service 9. Volume: significant edema bl le's. Will aim for 4L UF tomorrow provided his bp tolerates  Outpatient Orders (per last admission) 4hrs, edew 109kg, 2k, 2cal, 350/600, lue avf and tdc  [name], MD [facility]  Subjective:   No acute events. hgb down to 7. Overall feels well other than some pruritis. Tolerated hd yesterday, net uf 3.7L   Objective:   BP (!) 145/68 (BP Location: Right Leg)   Pulse (!) 102   Temp 98 F (36.7 C) (Oral)   Resp 16   Ht 5\' 7"  (1.702 m)   Wt 102.4 kg   SpO2 100%   BMI 35.36 kg/m   Intake/Output Summary (Last 24 hours) at 01/21/2020 1544 Last data filed at 01/21/2020 1410 Gross per 24 hour  Intake 720 ml  Output 5525 ml  Net -4805 ml   Weight change: 5.301 kg  Physical Exam: Gen:nad CVS:reg rate Resp:normal wob, bl chest expansion TZG:[id number], soft Ext:2+edema bl le's Access: hematoma/ecchymosis over lue avf, tdc c/d/i  Imaging: DG Thoracic Spine W/Swimmers  Result Date: 01/19/2020 CLINICAL DATA:  Worsening thoracic spine pain over the past week. No known injury. EXAM: THORACIC SPINE - 3 VIEWS COMPARISON:  None. FINDINGS: No fracture or malalignment. Scattered anterior endplate spurring is worst in the  lower thoracic spine. Paraspinous structures demonstrate no acute abnormality. IMPRESSION: No acute finding. Scattered thoracic degenerative disease. Electronically Signed   By: Inge Rise M.D.   On: 01/19/2020 17:20   DG Lumbar Spine Complete  Result Date: 01/19/2020 CLINICAL DATA:  Worsening low back pain over the past week. EXAM: LUMBAR SPINE - COMPLETE 4+ VIEW COMPARISON:  None. FINDINGS: No fracture or malalignment. Facet degenerative disease is severe at L5-S1. There is some anterior endplate spurring and mild loss of disc space height in the visualized lower thoracic spine and upper lumbar spine. Paraspinous structures demonstrate extensive atherosclerosis. IMPRESSION: No acute abnormality. 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 Facet arthropathy is severe at L5-S1. Aortic Atherosclerosis (ICD10-I70.0). Electronically Signed   By: Inge Rise M.D.   On: 01/19/2020 17:18   DG Abd Portable 1V  Result Date: 01/19/2020 CLINICAL DATA:  74 year old male with back pain.  Nausea. EXAM: PORTABLE ABDOMEN - 1 VIEW COMPARISON:  CT abdomen pelvis dated 01/01/2020. FINDINGS: Evaluation is limited due to body habitus. An air-filled loop of bowel in the right hemiabdomen measuring approximately 3.7 cm in caliber, likely a small-bowel. There is moderate amount of stool throughout the colon. No free air. Degenerative changes of the spine and hips. No acute osseous pathology. IMPRESSION: 1. Mildly dilated small bowel loop in the right hemiabdomen may represent ileus or early obstruction. Clinical correlation recommended. 2. Moderate amount of stool throughout the colon. Electronically Signed   By: Anner Crete M.D.   On: 01/19/2020 16:52    Labs: [employer]  Recent Labs  Lab 01/19/20 1351 01/20/20 0838 01/21/20 0724  NA 121* 124* 125*  K 4.8 4.7 3.7  CL 87* 86* 89*  CO2 20* 21* 25  GLUCOSE 59* 51* 187*  BUN 36* 41* 21  CREATININE 4.22* 4.72* 2.77*  CALCIUM 8.6* 8.9 8.4*    CBC Recent Labs  Lab 01/19/20 1351 01/20/20 0838 01/21/20 0724  WBC 3.5* 2.9* 2.9*  NEUTROABS 3.0  --   --   HGB 7.8* 8.1* 7.0*  HCT 24.5* 26.3* 23.1*  MCV 95.0 94.9 95.1  PLT 112* 119* 103*    Medications:    . vitamin C  1,000 mg Oral BID  . furosemide  40 mg Oral BID  . gabapentin  100 mg Oral QHS  . insulin aspart  0-6 Units Subcutaneous TID WC  . iron polysaccharides  150 mg Oral QHS  . lactulose  30 g Oral TID  . [START ON 01/22/2020] metoprolol succinate  25 mg Oral Q M,W,F-2000  . multivitamin with minerals  1 tablet Oral q AM  . pantoprazole  40 mg Oral Daily  . pravastatin  20 mg Oral q AM  . rifaximin  550 mg Oral BID  . spironolactone  50 mg Oral q AM  . tamsulosin  0.4 mg Oral QPM  . thiamine  100 mg Oral q AM  . venlafaxine  75 mg Oral TID WC  . vitamin B-12  1,000 mcg Oral Daily      [name], MD [facility] 01/21/2020, 3:44 PM

## 2020-01-22 DIAGNOSIS — N186 End stage renal disease: Secondary | ICD-10-CM | POA: Diagnosis not present

## 2020-01-22 DIAGNOSIS — Z992 Dependence on renal dialysis: Secondary | ICD-10-CM | POA: Diagnosis not present

## 2020-01-22 DIAGNOSIS — E7220 Disorder of urea cycle metabolism, unspecified: Secondary | ICD-10-CM

## 2020-01-22 LAB — CBC
HCT: 22.4 % — ABNORMAL LOW (ref 39.0–52.0)
Hemoglobin: 7 g/dL — ABNORMAL LOW (ref 13.0–17.0)
MCH: 29.7 pg (ref 26.0–34.0)
MCHC: 31.3 g/dL (ref 30.0–36.0)
MCV: 94.9 fL (ref 80.0–100.0)
Platelets: 106 10*3/uL — ABNORMAL LOW (ref 150–400)
RBC: 2.36 MIL/uL — ABNORMAL LOW (ref 4.22–5.81)
RDW: 17.3 % — ABNORMAL HIGH (ref 11.5–15.5)
WBC: 2.3 10*3/uL — ABNORMAL LOW (ref 4.0–10.5)
nRBC: 0 % (ref 0.0–0.2)

## 2020-01-22 LAB — GLUCOSE, CAPILLARY
Glucose-Capillary: 30 mg/dL — CL (ref 70–99)
Glucose-Capillary: 32 mg/dL — CL (ref 70–99)
Glucose-Capillary: 63 mg/dL — ABNORMAL LOW (ref 70–99)
Glucose-Capillary: 176 mg/dL — ABNORMAL HIGH (ref 70–99)
Glucose-Capillary: 179 mg/dL — ABNORMAL HIGH (ref 70–99)
Glucose-Capillary: 153 mg/dL — ABNORMAL HIGH (ref 70–99)
Glucose-Capillary: 152 mg/dL — ABNORMAL HIGH (ref 70–99)
Glucose-Capillary: 171 mg/dL — ABNORMAL HIGH (ref 70–99)
Glucose-Capillary: 200 mg/dL — ABNORMAL HIGH (ref 70–99)
Glucose-Capillary: 173 mg/dL — ABNORMAL HIGH (ref 70–99)
Glucose-Capillary: 207 mg/dL — ABNORMAL HIGH (ref 70–99)
Glucose-Capillary: 198 mg/dL — ABNORMAL HIGH (ref 70–99)
Glucose-Capillary: 183 mg/dL — ABNORMAL HIGH (ref 70–99)

## 2020-01-22 LAB — BASIC METABOLIC PANEL
Anion gap: 10 (ref 5–15)
BUN: 25 mg/dL — ABNORMAL HIGH (ref 8–23)
CO2: 23 mmol/L (ref 22–32)
Calcium: 8.4 mg/dL — ABNORMAL LOW (ref 8.9–10.3)
Chloride: 90 mmol/L — ABNORMAL LOW (ref 98–111)
Creatinine, Ser: 3.54 mg/dL — ABNORMAL HIGH (ref 0.61–1.24)
GFR calc Af Amer: 19 mL/min — ABNORMAL LOW (ref >60)
GFR calc non Af Amer: 16 mL/min — ABNORMAL LOW (ref >60)
Glucose, Bld: 226 mg/dL — ABNORMAL HIGH (ref 70–99)
Potassium: 3.9 mmol/L (ref 3.5–5.1)
Sodium: 123 mmol/L — ABNORMAL LOW (ref 135–145)

## 2020-01-22 LAB — PREPARE RBC (CROSSMATCH)

## 2020-01-22 LAB — MAGNESIUM: Magnesium: 1.6 mg/dL — ABNORMAL LOW (ref 1.7–2.4)

## 2020-01-22 MED ORDER — SODIUM CHLORIDE 0.9% IV SOLUTION: Freq: Once | INTRAVENOUS | Status: DC

## 2020-01-22 NOTE — Evaluation (Addendum)
Physical Therapy Evaluation Patient Details Name: Jeffrey [name] MRN: [id number] DOB: 26-Oct-1945 Today's Date: 01/22/2020   History of Present Illness  Lelynd Poer  is a 74 y.o. male, with medical history significant for alcoholic liver cirrhosis, ESRD (MWF), hypertension, diabetes mellitus type 2, hyperlipidemia, transfusion dependent anemia, portal hypertensive gastropathy, esophageal varices recent hospitalization earlier this month due to hepatic encephalopathy due to hyperammonemia and hypoglycemia,history was obtained from the wife by phone given he is poor historian, patient was seen by PCP recently, given some more pain medication for worsening lower back pain, there is no recent trauma or injury, patient with significant lower back pain where he could not tolerate to go to dialysis on Wednesday, as well same thing, he missed his hemodialysis, as well wife reported he had stopped taking his lactulose for last 48 hours as well, no fever, chills, melena, coffee-ground emesis, patient having worsening of her extremities.- in ED today to 95% on room air, work-up significant for sodium of 121, potassium of 4.8, glucose of 59, creatinine of 4.2, hemoglobin at baseline of 7.8, platelet at 112, ammonia at 37, [facility] hospitalist consulted to admit    Clinical Impression  The patient's O2 sat at rest was 85% on room air and 98% on 2.5L O2. After 70 feet of ambulation the patient's O2 sat was 83% on 2L O2, which rapidly increased to 96% 2LO2 after 1 minute of rest. The patient was modified independent with bed mobility, but required min assist during sit to stand and VC's in order to facilitate upright posture once weight bearing. The patient reported he occasionally has word finding difficulties and required breaks from ambulation in order to provide verbal communication. The patient demonstrated good sitting tolerance without LOB or dizziness. The patient had slow labored movement during ambulation, and required  increased time for turns. Patient tolerated sitting up in chair with chair alarm set after therapy. PLAN: The patient will continue to benefit from skilled physical therapy services in hospital at recommended venue below in order to improve balance, gait, and ADL's to promote independence in functional activities.    Follow Up Recommendations Home health PT;Supervision for mobility/OOB    Equipment Recommendations  None recommended by PT    Recommendations for Other Services   None recommended by PT.     Precautions / Restrictions Precautions Precautions: Fall Restrictions Weight Bearing Restrictions: No      Mobility  Bed Mobility Overal bed mobility: Needs Assistance;Modified Independent Bed Mobility: Supine to Sit;Sit to Supine     Supine to sit: Modified independent (Device/Increase time) Sit to supine: Independent      Transfers Overall transfer level: Needs assistance Equipment used: Rolling walker (2 wheeled) Transfers: Sit to/from Stand Sit to Stand: Min assist         General transfer comment: increase time to stand up straight once B weight bearing  Ambulation/Gait Ambulation/Gait assistance: Min guard Gait Distance (Feet): 70 Feet Assistive device: Rolling walker (2 wheeled) Gait Pattern/deviations: Decreased step length - left;Decreased step length - right;Step-through pattern     General Gait Details: slow labored movement; pt stopped to talk during ambulation  Stairs            Wheelchair Mobility    Modified Rankin (Stroke Patients Only)       Balance Overall balance assessment: Needs assistance   Sitting balance-Leahy Scale: Good     Standing balance support: Bilateral upper extremity supported;During functional activity Standing balance-Leahy Scale: Good  Pertinent Vitals/Pain Pain Assessment: No/denies pain    Home Living Family/patient expects to be discharged to:: Private  residence Living Arrangements: Spouse/significant other Available Help at Discharge: Family;Available PRN/intermittently Type of Home: House Home Access: Ramped entrance     Home Layout: Able to live on main level with bedroom/bathroom Home Equipment: Walker - 2 wheels;Walker - 4 wheels;Crutches;Grab bars - toilet;Toilet riser      Prior Function Level of Independence: Independent with assistive device(s)         Comments: household and limited community ambulator     Hand Dominance   Dominant Hand: Right    Extremity/Trunk Assessment   Upper Extremity Assessment Upper Extremity Assessment: Overall WFL for tasks assessed    Lower Extremity Assessment Lower Extremity Assessment: Generalized weakness (slow labored movement during sit to stand)    Cervical / Trunk Assessment Cervical / Trunk Assessment: Normal  Communication   Communication: HOH;Expressive difficulties (pt reports he sometimes has trouble finding words)  Cognition Arousal/Alertness: Awake/alert Behavior During Therapy: WFL for tasks assessed/performed Overall Cognitive Status: Within Functional Limits for tasks assessed                                        General Comments      Exercises     Assessment/Plan    PT Assessment Patient needs continued PT services  PT Problem List Decreased strength;Decreased mobility;Decreased activity tolerance;Decreased balance;Cardiopulmonary status limiting activity       PT Treatment Interventions DME instruction;Gait training;Functional mobility training;Therapeutic activities;Therapeutic exercise;Balance training;Neuromuscular re-education;Patient/family education    PT Goals (Current goals can be found in the Care Plan section)  Acute Rehab PT Goals Patient Stated Goal: To return home with significant other PT Goal Formulation: With patient Time For Goal Achievement: 01/29/20 Potential to Achieve Goals: Good    Frequency Min  3X/week   Barriers to discharge        Co-evaluation               AM-PAC PT "6 Clicks" Mobility  Outcome Measure Help needed turning from your back to your side while in a flat bed without using bedrails?: None Help needed moving from lying on your back to sitting on the side of a flat bed without using bedrails?: A Little Help needed moving to and from a bed to a chair (including a wheelchair)?: A Little Help needed standing up from a chair using your arms (e.g., wheelchair or bedside chair)?: A Little Help needed to walk in hospital room?: None Help needed climbing 3-5 steps with a railing? : A Lot 6 Click Score: 19    End of Session Equipment Utilized During Treatment: Gait belt;Oxygen Activity Tolerance: Patient tolerated treatment well Patient left: with call bell/phone within reach;Other (comment);in chair Nurse Communication: Mobility status PT Visit Diagnosis: Unsteadiness on feet (R26.81);Other abnormalities of gait and mobility (R26.89);Muscle weakness (generalized) (M62.81)    Time: 8676-1950 PT Time Calculation (min) (ACUTE ONLY): 38 min   Charges:   PT Evaluation $PT Eval Moderate Complexity: 1 Mod PT Treatments $Therapeutic Activity: 38-52 mins        2:21 PM , 01/22/20 Karlyn Agee, SPT Physical Therapy with [facility] [phone] office   During this treatment session, the therapist was present, participating in and directing the treatment.  2:21 PM, 01/22/20 Lonell Grandchild, MPT Physical Therapist with [facility] [phone]  office [phone] mobile phone

## 2020-01-22 NOTE — Progress Notes (Signed)
  [facility] Progress Note    Assessment/ Plan:   1. Encephalopathy -secondary to pain medications, hypoglycemia, missing HD -improved 2. ESRD on HD MWF,  plan for HD again on Mon 8/23 if patient is still here; can pt DC this AM to outpt HD? 3. Hyponatremia, slightly improved -overall poor prognostic indicator in the setting of cirrhosis. UF as tolerated 4. Hypoalbuminemia -protein supplementation and should cont chronic fluid restriciton 5. Anemia of chronic kidney disease - hgb stable 7 this am.  6. CKD-MBD/secondary hyperparathyroidism -monitor phos and cal 7. ESLD/alcoholic cirrhosis 8. DM2 with hypoglycemic episodes -mgmt per primary service 9. Volume: significant edema bl le's. Cont aggressive UF, pt needs sig IDWG as outpt  Outpatient Orders (per last admission) 4hrs, edew 109kg, 2k, 2cal, 350/600, lue avf and tdc  [name], MD [facility]  Subjective:     NO c/o this AM  Eating breakfast this AM  For HD   Objective:   BP (!) 141/58   Pulse (!) 106   Temp 98.2 F (36.8 C)   Resp 16   Ht 5\' 7"  (1.702 m)   Wt 106.6 kg   SpO2 100%   BMI 36.81 kg/m   Intake/Output Summary (Last 24 hours) at 01/22/2020 0905 Last data filed at 01/21/2020 1805 Gross per 24 hour  Intake 960 ml  Output 1000 ml  Net -40 ml   Weight change: 0.6 kg  Physical Exam: Gen:nad, sitting on bedside commode CVS:reg rate Resp:normal wob, bl chest expansion HQI:[id number], soft Ext:3+edema bl le's Access: hematoma/ecchymosis over lue avf, tdc c/d/i  Imaging: No results found.  Labs: BMET Recent Labs  Lab 01/19/20 1351 01/20/20 0838 01/21/20 0724 01/22/20 0504  NA 121* 124* 125* 123*  K 4.8 4.7 3.7 3.9  CL 87* 86* 89* 90*  CO2 20* 21* 25 23  GLUCOSE 59* 51* 187* 226*  BUN 36* 41* 21 25*  CREATININE 4.22* 4.72* 2.77* 3.54*  CALCIUM 8.6* 8.9 8.4* 8.4*   CBC Recent Labs  Lab 01/19/20 1351 01/20/20 0838 01/21/20 0724 01/22/20 0504   WBC 3.5* 2.9* 2.9* 2.3*  NEUTROABS 3.0  --   --   --   HGB 7.8* 8.1* 7.0* 7.0*  HCT 24.5* 26.3* 23.1* 22.4*  MCV 95.0 94.9 95.1 94.9  PLT 112* 119* 103* 106*    Medications:    . sodium chloride   Intravenous Once  . vitamin C  1,000 mg Oral BID  . furosemide  40 mg Oral BID  . gabapentin  100 mg Oral QHS  . insulin aspart  0-6 Units Subcutaneous TID WC  . iron polysaccharides  150 mg Oral QHS  . lactulose  30 g Oral TID  . metoprolol succinate  25 mg Oral Q M,W,F-2000  . multivitamin with minerals  1 tablet Oral q AM  . pantoprazole  40 mg Oral Daily  . pravastatin  20 mg Oral q AM  . rifaximin  550 mg Oral BID  . spironolactone  50 mg Oral q AM  . tamsulosin  0.4 mg Oral QPM  . thiamine  100 mg Oral q AM  . venlafaxine  75 mg Oral TID WC  . vitamin B-12  1,000 mcg Oral Daily      Rexene Agent MD [facility] 01/22/2020, 9:05 AM

## 2020-01-22 NOTE — Discharge Summary (Signed)
Physician Discharge Summary  Jeffrey [name] IRC:[id number] DOB: [birth year] DOA: 01/19/2020  PCP: Monico Blitz, MD  Admit date: 01/19/2020 Discharge date: 01/22/2020  Admitted From: Home Disposition: Home  Recommendations for Outpatient Follow-up:  1. Follow up with PCP in 1-2 weeks 2. Please obtain BMP/CBC in one week your next doctors visit.  3. Patient being discharged from here to go to his dialysis outpatient.  Arranged by nephrology    Discharge Condition: Stable CODE STATUS: DNR Diet recommendation: 2 g salt/renal  Brief/Interim Summary: 74 y.o.male,with medical history significant foralcoholic liver cirrhosis, ESRD(MWF), hypertension, diabetes mellitus type 2, hyperlipidemia, transfusion dependent anemia, portal hypertensive gastropathy, esophageal varicesrecent hospitalization earlier this month due to hepatic encephalopathy due to hyperammonemia and hypoglycemia.  Encephalopathy is slowly improving, tolerating lactulose and having 3 bowel movements over last 24 hours.  During the hospitalization with combination of dialysis and lactulose his mentation improved.  For his low back pain lumbar x-ray was done which was negative for any acute pathology but showed degenerative disc disease. Over the course of 48 hours his mentation improved, he started doing well and was deemed stable to be discharged straight to outpatient dialysis center from the hospital.  Nephrology made arrangements for this.   Assessment & Plan:   Active Problems:   Alcoholic cirrhosis (HCC)   Edema   Type 2 diabetes mellitus (HCC)   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HCC)   Acute hepatic encephalopathy   Encephalopathy acute   Hyponatremia   Hypoglycemia   Hyperammonemia (HCC)   Hepatic encephalopathy (HCC)  Acute Hepatic encephalopathy/generalized weakness/deconditioning, improved -Encephalopathy has resolved with lactulose and dialysis.  Currently his mentation is at baseline.  Continue using home  lactulose-titrate to 3-4 soft bowel movements daily  ESRD on hemodialysis Monday Wednesday Friday -Missed sessions outpatient.  Underwent a session of dialysis in the hospital, plans for outpatient HD today.  Arranged by nephrology -Nephro consulted for HD  Low back pain -Chronic secondary to degenerative disc disease  Diabetes mellitus, type II, poorly controlled -A1c during recent admission 5.9,  resume his home regimen.  Hyponatremia -Due to vol overload. 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 -Spoke with nephrology on the day of discharge who closely monitor his outpatient hemoglobin.  Transfuse as necessary.  Essential Hypertension -Resume metoprolol tomorrow after dialysis.  Chronic respiratory failure on 2L [place] at home.  -Secondary to diastolic heart failure -Continue oxygen supplementation; IS/ Flutter -2 L through nasal cannula at baseline  Chronic diastolic heart failure -Echo 12/2019= Ef 70%, supportive care. He is going to HD -Plan to resume Lasix, BB and Aldactone post HD.   BPH -flomax   HLD -Pravastatin   Body mass index is 36.81 kg/m.         Discharge Diagnoses:  Active Problems:   Alcoholic cirrhosis (HCC)   Edema   Type 2 diabetes mellitus (HCC)   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[place])   Acute hepatic encephalopathy   Encephalopathy acute   Hyponatremia   Hypoglycemia   Hyperammonemia (HCC)   Hepatic encephalopathy ([place])    Consultations:  Nephrology   Subjective: Seen and examined, ready to go to his outpatient hemodialysis.  Discharge Exam: Vitals:   01/21/20 2034 01/22/20 0600  BP: (!) 151/55 (!) 141/58  Pulse: (!) 103 (!) 106  Resp: 16 16  Temp: 98.2 F (36.8 C) 98.2 F (36.8  C)  SpO2: 100% 100%   Vitals:   01/21/20 0437 01/21/20 1409 01/21/20 2034 01/22/20 0600  BP: (!) 114/48 (!) 145/68 (!) 151/55 (!) 141/58  Pulse: (!) 106 (!) 102 (!) 103 (!) 106  Resp: 16 16 16 16   Temp: 98 F (36.7 C)  98.2 F (36.8 C) 98.2 F (36.8 C)  TempSrc: Oral  Oral   SpO2: 98% 100% 100% 100%  Weight:    106.6 kg  Height:        General: Pt is alert, awake, not in acute distress Cardiovascular: RRR, S1/S2 +, no rubs, no gallops Respiratory: CTA bilaterally, no wheezing, no rhonchi Abdominal: Soft, NT, ND, bowel sounds + Extremities: no edema, no cyanosis Left upper extremity AV fistula, bruit felt  Discharge Instructions  Discharge Instructions    Discharge patient   Complete by: As directed    Discharge disposition: 01-Home or Self Care   Discharge patient date: 01/22/2020     Allergies as of 01/22/2020      Reactions   Chlorhexidine    Tape Itching, Rash      Medication List    TAKE these medications   acetaminophen 500 MG tablet Commonly known as: TYLENOL Take 500 mg by mouth every 6 (six) hours as needed for mild pain.   furosemide 40 MG tablet Commonly known as: LASIX Take 40 mg by mouth 2 (two) times daily.   gabapentin 100 MG capsule Commonly known as: NEURONTIN TAKE 1 CAPSULE BY MOUTH AT BEDTIME   HumaLOG KwikPen 100 UNIT/ML KwikPen Generic drug: insulin lispro Inject 50 Units into the skin as needed. Sliding scale   HYDROcodone-acetaminophen 5-325 MG tablet Commonly known as: NORCO/VICODIN Take 1 tablet by mouth 2 (two) times daily as needed. What changed: Another medication with the same name was removed. Continue taking this medication, and follow the directions you see here.   iron polysaccharides 150 MG capsule Commonly known as: NIFEREX Take 150 mg by mouth at bedtime.   lactulose 10 GM/15ML solution Commonly known as: CHRONULAC Take 45 mLs (30 g total) by mouth 3 (three) times daily.   metoprolol succinate 25 MG 24 hr  tablet Commonly known as: TOPROL-XL Take 1 tablet (25 mg total) by mouth daily. Take at bedtime  on Monday ,Wednesday and Friday due to dialaylisis What changed:   when to take this  additional instructions   milk thistle 175 MG tablet Take 175 mg by mouth 3 (three) times daily.   multivitamin with minerals Tabs tablet Take 1 tablet by mouth in the morning.   nystatin powder Commonly known as: MYCOSTATIN/NYSTOP Apply topically 3 (three) times daily. Into groin area and affected area   omega-3 acid ethyl esters 1 g capsule Commonly known as: LOVAZA Take 2 g by mouth 2 (two) times daily.   omeprazole 40 MG capsule Commonly known as: PRILOSEC Take 40 mg by mouth in the morning.   pravastatin 20 MG tablet Commonly known as: PRAVACHOL Take 20 mg by mouth in the morning.   PROBIOTIC DAILY PO Take 1 tablet by mouth in the morning.   rifaximin 550 MG Tabs tablet Commonly known as: Xifaxan Take 1 tablet (550 mg total) by mouth 2 (two) times daily.   spironolactone 50 MG tablet Commonly known as: ALDACTONE Take 50 mg by mouth in the morning.   tamsulosin 0.4 MG Caps capsule Commonly known as: FLOMAX Take 0.4 mg by mouth every  evening.   thiamine 100 MG tablet Take 100 mg by mouth in the morning. Vitamin B-1   Tresiba FlexTouch 200 UNIT/ML FlexTouch Pen Generic drug: insulin degludec Inject 30 Units into the skin at bedtime. When blood sugars are up What changed: how much to take   triamcinolone cream 0.1 % Commonly known as: KENALOG Apply 1 application topically 2 (two) times daily as needed (dry skin Itching).   venlafaxine 75 MG tablet Commonly known as: EFFEXOR Take 75 mg by mouth 3 (three) times daily with meals.   vitamin B-12 1000 MCG tablet Commonly known as: CYANOCOBALAMIN Take 1,000 mcg by mouth daily.   vitamin C 1000 MG tablet Take 1,000 mg by mouth 2 (two) times daily.       Follow-up Information    Monico Blitz, MD. Schedule an appointment as  soon as possible for a visit in 1 week.   Specialty: Internal Medicine Contact information: [address]  [place] [address] [phone]              Allergies  Allergen Reactions  . Chlorhexidine   . Tape Itching and Rash    You were cared for by a hospitalist during your hospital stay. If you have any questions about your discharge medications or the care you received while you were in the hospital after you are discharged, you can call the unit and asked to speak with the hospitalist on call if the hospitalist that took care of you is not available. Once you are discharged, your primary care physician will handle any further medical issues. Please note that no refills for any discharge medications will be authorized once you are discharged, as it is imperative that you return to your primary care physician (or establish a relationship with a primary care physician if you do not have one) for your aftercare needs so that they can reassess your need for medications and monitor your lab values.   Procedures/Studies: CT ABDOMEN PELVIS W WO CONTRAST  Result Date: 01/01/2020 CLINICAL DATA:  Cirrhosis, abdominal pain,, 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 CONTRAST:  112mL OMNIPAQUE IOHEXOL 300 MG/ML  SOLN COMPARISON:  CT abdomen pelvis, 12/31/2019 FINDINGS: Lower chest: Unchanged moderate left, small right pleural effusions and associated atelectasis or consolidation. Coronary artery calcifications. Hepatobiliary: Cirrhotic morphology of the liver. There is unchanged, irregular subcapsular hypodensity of the anterior dome of the liver (series 12, image 119) without suspicious contrast enhancement or focal lesion, this appearance not changed  compared to prior examinations dating back to 01/10/2016. Gallstones in the contracted gallbladder. No biliary ductal dilatation. Pancreas: Unremarkable. No pancreatic ductal dilatation or surrounding inflammatory changes. Spleen: Splenomegaly, maximum coronal span 17.0 cm. Adrenals/Urinary Tract: Adrenal glands are unremarkable. Kidneys are normal, without renal calculi, solid lesion, or hydronephrosis. Bladder is decompressed by Foley catheter. Stomach/Bowel: Stomach is within normal limits. Appendix is not clearly visualized. No evidence of bowel wall thickening, distention, or inflammatory changes. Occasional sigmoid diverticula. Vascular/Lymphatic: Aortic atherosclerosis. There is some eccentric calcification of the central portions of the superior mesenteric vein near the confluence with the portal vein; there is no significant intraluminal thrombus (series 12, image 58). No enlarged abdominal or pelvic lymph nodes. Reproductive: No mass or other significant abnormality. Other: No abdominal wall hernia or abnormality. Small volume ascites  in the abdomen and pelvis, unchanged compared to prior examination. Musculoskeletal: No acute or significant osseous findings. IMPRESSION: 1. Cirrhotic morphology of the liver. There is unchanged, irregular subcapsular hypodensity of the anterior dome of the liver without suspicious contrast enhancement or focal lesion, this appearance not changed compared to prior examinations dating back to 01/10/2016 and perhaps related to scarring or fibrosis. 2. There is some eccentric calcification of the central portions of the superior mesenteric vein near the confluence with the portal vein; there is no significant intraluminal thrombus. 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 3. Splenomegaly. 4. Cholelithiasis. 5. Small volume ascites in the abdomen and pelvis, unchanged compared to prior examination. 6. Unchanged moderate left, small right  pleural effusions and associated atelectasis or consolidation. 7. Coronary artery disease.  Aortic Atherosclerosis (ICD10-I70.0). Electronically Signed   By: Eddie Candle M.D.   On: 01/01/2020 11:22   CT ABDOMEN PELVIS WO CONTRAST  Result Date: 12/31/2019 CLINICAL DATA:  Abdominal distension, abdominal pain for 2 weeks. EXAM: CT ABDOMEN AND PELVIS WITHOUT CONTRAST TECHNIQUE: Multidetector CT imaging of the abdomen and pelvis was performed following the standard protocol without IV contrast. COMPARISON:  January 10, 2016 FINDINGS: Lower chest: Moderate LEFT and small RIGHT pleural effusion with basilar airspace disease also greatest on the LEFT. Central venous access device terminates at the caval to atrial junction incompletely imaged. No pericardial fluid. Hepatobiliary: Heterogeneity of the RIGHT hepatic lobe showing linear and branching appearance grossly similar to the study of January 10, 2016. Frankly cirrhotic liver. Cholelithiasis. Gallbladder is collapsed. There is generalized stranding throughout the abdomen nonspecific to the gallbladder. Focal low attenuation in the RIGHT hemi liver on image 15 of series 2 2.5 x 3.4 cm. Pancreas: Pancreas is normal without ductal dilation, gross inflammation or contour change from previous imaging study. Spleen: Spleen remains enlarged. Signs of portosystemic collaterals in the upper abdomen. This includes splenorenal shunting. Adrenals/Urinary Tract: Adrenal glands grossly normal though LEFT adrenal is partially obscured by retroperitoneal venous collaterals. No hydronephrosis. No perinephric stranding. Urinary bladder collapsed with Foley catheter in situ. No nephrolithiasis. No ureteral calculi. Stomach/Bowel: Stool throughout the colon. Generalized edema in the abdominal mesenteries. Small volume ascites mainly in the LEFT hemiabdomen. No perienteric or pericolonic stranding. Signs of colonic diverticulosis, mild. Vascular/Lymphatic: Calcified atheromatous plaque in  the abdominal aorta. No aneurysmal dilation. No adenopathy in the retroperitoneum. No adenopathy in the pelvis. 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 Reproductive: Prostate grossly normal by CT. Other: Body wall edema and skin thickening over a lower abdominal pannus. Skin thickening was present previously. Interval abdominal wall reconstruction and repair of umbilical hernia since the prior study. Generalized body wall edema. Musculoskeletal: No acute musculoskeletal process. Spinal degenerative changes. IMPRESSION: 1. Moderate LEFT and small RIGHT pleural effusion with basilar airspace disease also greatest on the LEFT. Airspace disease likely volume loss associated with effusion. 2. Calcification of the periphery of the SMV and portal venous system can be seen in the setting of chronic thrombosis. CT or multiphase MRI may be helpful as clinically warranted. 3. Cirrhotic liver with signs of portal hypertension. Generalized edema could be also related to liver disease. Focal area of low attenuation in the RIGHT hemi liver may represent confluent fibrosis the given severity of cirrhotic changes however, would suggest follow-up with multiphase CT or MRI as dictated by the patient's renal function. 4. Small volume ascites mainly in the LEFT  hemiabdomen. The location of the ascites raising the question of some loculation. No peripheral rind is suggested on noncontrast imaging. Correlate with any clinical symptoms of or history of SBP. 5. Cholelithiasis with collapsed gallbladder and with generalized stranding in the abdomen in the setting of portal hypertension. 6. Interval abdominal wall reconstruction and repair of umbilical hernia since the prior study. 7. Body wall edema and skin thickening over a lower abdominal pannus. Skin thickening was present previously. Correlate with any clinical evidence of cellulitis. 8. Aortic  atherosclerosis. These results were called by telephone at the time of interpretation on 12/31/2019 at 7:44 pm to provider JULIE IDOL , who verbally acknowledged these results. Aortic Atherosclerosis (ICD10-I70.0). Electronically Signed   By: Zetta Bills M.D.   On: 12/31/2019 19:38   DG Chest 1 View  Result Date: 01/01/2020 CLINICAL DATA:  LEFT pleural effusion post thoracentesis EXAM: CHEST  1 VIEW COMPARISON:  Portable exam 1138 hours compared to 09/18/2019 FINDINGS: Dual lumen RIGHT jugular catheter with tip projecting over cavoatrial junction. Enlargement of cardiac silhouette with pulmonary vascular congestion. Bibasilar atelectasis. Lungs otherwise clear. No gross pleural effusion or pneumothorax. IMPRESSION: No pneumothorax following LEFT thoracentesis. Electronically Signed   By: Lavonia Dana M.D.   On: 01/01/2020 13:51   DG Thoracic Spine W/Swimmers  Result Date: 01/19/2020 CLINICAL DATA:  Worsening thoracic spine pain over the past week. No known injury. EXAM: THORACIC SPINE - 3 VIEWS COMPARISON:  None. FINDINGS: No fracture or malalignment. Scattered anterior endplate spurring is worst in the lower thoracic spine. Paraspinous structures demonstrate no acute abnormality. IMPRESSION: No acute finding. Scattered thoracic degenerative disease. Electronically Signed   By: Inge Rise M.D.   On: 01/19/2020 17:20   DG Lumbar Spine Complete  Result Date: 01/19/2020 CLINICAL DATA:  Worsening low back pain over the past week. EXAM: LUMBAR SPINE - COMPLETE 4+ VIEW COMPARISON:  None. FINDINGS: No fracture or malalignment. Facet degenerative disease is severe at L5-S1. There is some anterior endplate spurring and mild loss of disc space height in the visualized lower thoracic spine and upper lumbar spine. Paraspinous structures demonstrate extensive atherosclerosis. IMPRESSION: No acute abnormality. 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 Facet arthropathy is severe at L5-S1. Aortic  Atherosclerosis (ICD10-I70.0). Electronically Signed   By: Inge Rise M.D.   On: 01/19/2020 17:18   US Paracentesis  Result Date: 01/01/2020 INDICATION: Alcoholic cirrhosis, end-stage renal disease on dialysis, ascites, question spontaneous bacterial peritonitis EXAM: ULTRASOUND GUIDED DIAGNOSTIC PARACENTESIS MEDICATIONS: None COMPLICATIONS: None immediate PROCEDURE: Informed written consent was obtained from the patient after a discussion of the risks, benefits and alternatives to treatment. A timeout was performed prior to the initiation of the procedure. Initial ultrasound scanning demonstrates a large amount of ascites within the LEFT lower abdominal quadrant. The right lower abdomen was prepped and draped in the usual sterile fashion. 1% lidocaine was used for local anesthesia. Following this, a 5 [place] Yueh catheter was introduced. An ultrasound image was saved for documentation purposes. The paracentesis was performed. The catheter was removed and a dressing was applied. The patient tolerated the procedure well without immediate post procedural complication. FINDINGS: A total of approximately 240 mL of yellow ascitic fluid was removed. Samples were sent to the laboratory as requested by the clinical team. IMPRESSION: Successful ultrasound-guided paracentesis yielding 240 mL of peritoneal fluid. Electronically Signed   By: Lavonia Dana M.D.   On: 01/01/2020 14:13   DG Chest Portable 1 View  Result Date: 01/19/2020 CLINICAL DATA:  Shortness of breath.  Missed dialysis EXAM: PORTABLE CHEST 1 VIEW COMPARISON:  01/01/2020 FINDINGS: Stable right IJ approach hemodialysis catheter. Cardiomegaly. Pulmonary vascular congestion with increased interstitial markings bilaterally. Suspect small left pleural effusion. No pneumothorax. IMPRESSION: 1. Cardiomegaly with pulmonary vascular congestion and interstitial edema. 2. Suspect small left pleural effusion. Electronically Signed   By: Davina Poke D.O.    On: 01/19/2020 14:36   DG Abd Portable 1V  Result Date: 01/19/2020 CLINICAL DATA:  74 year old male with back pain.  Nausea. EXAM: PORTABLE ABDOMEN - 1 VIEW COMPARISON:  CT abdomen pelvis dated 01/01/2020. FINDINGS: Evaluation is limited due to body habitus. An air-filled loop of bowel in the right hemiabdomen measuring approximately 3.7 cm in caliber, likely a small-bowel. There is moderate amount of stool throughout the colon. No free air. Degenerative changes of the spine and hips. No acute osseous pathology. IMPRESSION: 1. Mildly dilated small bowel loop in the right hemiabdomen may represent ileus or early obstruction. Clinical correlation recommended. 2. Moderate amount of stool throughout the colon. Electronically Signed   By: Anner Crete M.D.   On: 01/19/2020 16:52   ECHOCARDIOGRAM COMPLETE  Result Date: 01/01/2020    ECHOCARDIOGRAM REPORT   Patient Name:   DEROY NOAH Date of Exam: 01/01/2020 Medical Rec #:  [id number]    Height:       67.0 in Accession #:    [id number]   Weight:       238.5 lb Date of Birth:  1946-03-23     BSA:          2.180 m Patient Age:    53 years     BP:           163/70 mmHg Patient Gender: M            HR:           89 bpm. Exam Location:  [name] Procedure: 2D Echo Indications:    Congestive Heart Failure 428.0 / I50.9  History:        Patient has no prior history of Echocardiogram examinations.                 Risk Factors:Diabetes and Former Smoker. Alcoholic cirrhosis ,                 ESRD.  Sonographer:    Leavy Cella RDCS (AE) Referring Phys: [phone] Royanne Foots [place]  1. Left ventricular ejection fraction, by estimation, is 70 to 75%. The left ventricle has normal function. The left ventricle has no regional wall motion abnormalities. Left ventricular diastolic parameters were normal.  2. Right ventricular systolic function was not well visualized. The right ventricular size is not well visualized.  3. Left atrial size was mildly dilated.  4. The  mitral valve is normal in structure. No evidence of mitral valve regurgitation. No evidence of mitral stenosis.  5. The aortic valve has an indeterminant number of cusps. Aortic valve regurgitation is not visualized. Mild aortic valve stenosis.  6. The inferior vena cava is normal in size with greater than 50% respiratory variability, suggesting right atrial pressure of 3 mmHg. FINDINGS  Left Ventricle: Left ventricular ejection fraction, by estimation, is 70 to 75%. The left ventricle has normal function. The left ventricle has no regional wall motion abnormalities. The left ventricular internal cavity size was normal in size. There is  no left ventricular hypertrophy. Left ventricular diastolic parameters were normal. Right Ventricle: RV not well visualized, grossly appears  normal in size and function. The right ventricular size is not well visualized. Right vetricular wall thickness was not assessed. Right ventricular systolic function was not well visualized. Left Atrium: Left atrial size was mildly dilated. Right Atrium: Right atrial size was normal in size. Pericardium: There is no evidence of pericardial effusion. Mitral Valve: The mitral valve is normal in structure. No evidence of mitral valve regurgitation. No evidence of mitral valve stenosis. Tricuspid Valve: The tricuspid valve is normal in structure. Tricuspid valve regurgitation is not demonstrated. No evidence of tricuspid stenosis. Aortic Valve: The aortic valve has an indeterminant number of cusps. . There is mild thickening and mild calcification of the aortic valve. Aortic valve regurgitation is not visualized. Mild aortic stenosis is present. Mild aortic valve annular calcification. There is mild thickening of the aortic valve. There is mild calcification of the aortic valve. Aortic valve mean gradient measures 9.3 mmHg. Aortic valve peak gradient measures 16.8 mmHg. Aortic valve area, by VTI measures 1.72 cm. Pulmonic Valve: The pulmonic valve  was not well visualized. Pulmonic valve regurgitation is not visualized. No evidence of pulmonic stenosis. Aorta: The aortic root is normal in size and structure. Pulmonary Artery: Indeterminant PASP, inadequate TR jet. Venous: The inferior vena cava is normal in size with greater than 50% respiratory variability, suggesting right atrial pressure of 3 mmHg. IAS/Shunts: No atrial level shunt detected by color flow Doppler.  LEFT VENTRICLE PLAX 2D LVIDd:         4.52 cm  Diastology LVIDs:         2.60 cm  LV e' lateral:   12.00 cm/s LV PW:         1.00 cm  LV E/e' lateral: 9.2 LV IVS:        1.01 cm  LV e' medial:    7.72 cm/s LVOT diam:     2.10 cm  LV E/e' medial:  14.2 LV SV:         75 LV SV Index:   35 LVOT Area:     3.46 cm  RIGHT VENTRICLE RV S prime:     13.30 cm/s TAPSE (M-mode): 2.1 cm LEFT ATRIUM             Index       RIGHT ATRIUM           Index LA diam:        4.70 cm 2.16 cm/m  RA Area:     23.30 cm LA Vol (A2C):   67.6 ml 31.02 ml/m RA Volume:   79.60 ml  36.52 ml/m LA Vol (A4C):   72.3 ml 33.17 ml/m LA Biplane Vol: 70.4 ml 32.30 ml/m  AORTIC VALVE AV Area (Vmax):    1.69 cm AV Area (Vmean):   1.63 cm AV Area (VTI):     1.72 cm AV Vmax:           204.67 cm/s AV Vmean:          138.333 cm/s AV VTI:            0.438 m AV Peak Grad:      16.8 mmHg AV Mean Grad:      9.3 mmHg LVOT Vmax:         99.70 cm/s LVOT Vmean:        65.033 cm/s LVOT VTI:          0.217 m LVOT/AV VTI ratio: 0.50  AORTA Ao Root diam: 3.20 cm MITRAL VALVE MV Area (  PHT): 3.56 cm     SHUNTS MV Decel Time: 213 msec     Systemic VTI:  0.22 m MV E velocity: 110.00 cm/s  Systemic Diam: 2.10 cm MV A velocity: 54.80 cm/s MV E/A ratio:  2.01 Carlyle Dolly MD Electronically signed by Carlyle Dolly MD Signature Date/Time: 01/01/2020/6:48:57 PM    Final    US THORACENTESIS ASP PLEURAL SPACE W/IMG GUIDE  Result Date: 01/01/2020 INDICATION: Pleural effusion, history alcoholic cirrhosis, atrial fibrillation, end-stage renal disease  on dialysis, diabetes mellitus, hypertension EXAM: ULTRASOUND GUIDED DIAGNOSTIC LEFT THORACENTESIS MEDICATIONS: None. COMPLICATIONS: None immediate. PROCEDURE: An ultrasound guided thoracentesis was thoroughly discussed with the patient and questions answered. The benefits, risks, alternatives and complications were also discussed. The patient understands and wishes to proceed with the procedure. Written consent was obtained. Ultrasound was performed to localize and mark an adequate pocket of fluid in the LEFT chest. The area was then prepped and draped in the normal sterile fashion. 1% Lidocaine was used for local anesthesia. Under ultrasound guidance a 5 [place] Yueh catheter was introduced. Thoracentesis was performed. The catheter was removed and a dressing applied. FINDINGS: A total of approximately 240 mL of dark yellow LEFT pleural fluid was removed. Samples were sent to the laboratory as requested by the clinical team. IMPRESSION: Successful ultrasound guided diagnostic LEFT thoracentesis yielding 240 mL of pleural fluid. Electronically Signed   By: Lavonia Dana M.D.   On: 01/01/2020 13:50      The results of significant diagnostics from this hospitalization (including imaging, microbiology, ancillary and laboratory) are listed below for reference.     Microbiology: Recent Results (from the past 240 hour(s))  SARS Coronavirus 2 by RT PCR (hospital order, performed in [facility] hospital lab) Nasopharyngeal Nasopharyngeal Swab     Status: None   Collection Time: 01/19/20  3:06 PM   Specimen: Nasopharyngeal Swab  Result Value Ref Range Status   SARS Coronavirus 2 NEGATIVE NEGATIVE Final    Comment: (NOTE) SARS-CoV-2 target nucleic acids are NOT DETECTED.  The SARS-CoV-2 RNA is generally detectable in upper and lower respiratory specimens during the acute phase of infection. The lowest concentration of SARS-CoV-2 viral copies this assay can detect is 250 copies / mL. A negative result does  not preclude SARS-CoV-2 infection and should not be used as the sole basis for treatment or other patient management decisions.  A negative result may occur with improper specimen collection / handling, submission of specimen other than nasopharyngeal swab, presence of viral mutation(s) within the areas targeted by this assay, and inadequate number of viral copies (<250 copies / mL). A negative result must be combined with clinical observations, patient history, and epidemiological information.  Fact Sheet for Patients:   StrictlyIdeas.no  Fact Sheet for Healthcare Providers: BankingDealers.co.za  This test is not yet approved or  cleared by the [place] FDA and has been authorized for detection and/or diagnosis of SARS-CoV-2 by FDA under an Emergency Use Authorization (EUA).  This EUA will remain in effect (meaning this test can be used) for the duration of the COVID-19 declaration under Section 564(b)(1) of the Act, 21 U.S.C. section 360bbb-3(b)(1), unless the authorization is terminated or revoked sooner.  Performed at [facility], [address], [place], [place] [address]      Labs: BNP (last 3 results) No results for input(s): BNP in the last 8760 hours. Basic Metabolic Panel: Recent Labs  Lab 01/19/20 1351 01/20/20 0838 01/21/20 0724 01/22/20 0504  NA 121* 124* 125* 123*  K 4.8 4.7 3.7 3.9  CL 87* 86* 89* 90*  CO2 20* 21* 25 23  GLUCOSE 59* 51* 187* 226*  BUN 36* 41* 21 25*  CREATININE 4.22* 4.72* 2.77* 3.54*  CALCIUM 8.6* 8.9 8.4* 8.4*  MG  --   --  1.6* 1.6*   Liver Function Tests: Recent Labs  Lab 01/19/20 1351  AST 25  ALT 27  ALKPHOS 304*  BILITOT 1.0  PROT 7.3  ALBUMIN 2.7*   No results for input(s): LIPASE, AMYLASE in the last 168 hours. Recent Labs  Lab 01/19/20 1351 01/21/20 0724  AMMONIA 37* 29   CBC: Recent Labs  Lab 01/19/20 1351 01/20/20 0838 01/21/20 0724 01/22/20 0504  WBC  3.5* 2.9* 2.9* 2.3*  NEUTROABS 3.0  --   --   --   HGB 7.8* 8.1* 7.0* 7.0*  HCT 24.5* 26.3* 23.1* 22.4*  MCV 95.0 94.9 95.1 94.9  PLT 112* 119* 103* 106*   Cardiac Enzymes: No results for input(s): CKTOTAL, CKMB, CKMBINDEX, TROPONINI in the last 168 hours. BNP: Invalid input(s): POCBNP CBG: Recent Labs  Lab 01/21/20 0739 01/21/20 1120 01/21/20 1642 01/21/20 2023 01/22/20 0833  GLUCAP 173* 207* 198* 170* 183*   D-Dimer No results for input(s): DDIMER in the last 72 hours. Hgb A1c No results for input(s): HGBA1C in the last 72 hours. Lipid Profile No results for input(s): CHOL, HDL, LDLCALC, TRIG, CHOLHDL, LDLDIRECT in the last 72 hours. Thyroid function studies Recent Labs    01/20/20 0838  TSH 3.238   Anemia work up No results for input(s): VITAMINB12, FOLATE, FERRITIN, TIBC, IRON, RETICCTPCT in the last 72 hours. Urinalysis    Component Value Date/Time   COLORURINE AMBER (A) 01/04/2020 1259   APPEARANCEUR CLOUDY (A) 01/04/2020 1259   LABSPEC 1.016 01/04/2020 1259   PHURINE 5.0 01/04/2020 1259   GLUCOSEU NEGATIVE 01/04/2020 1259   HGBUR LARGE (A) 01/04/2020 1259   BILIRUBINUR NEGATIVE 01/04/2020 1259   KETONESUR NEGATIVE 01/04/2020 1259   PROTEINUR 30 (A) 01/04/2020 1259   NITRITE NEGATIVE 01/04/2020 1259   LEUKOCYTESUR LARGE (A) 01/04/2020 1259   Sepsis Labs Invalid input(s): PROCALCITONIN,  WBC,  LACTICIDVEN Microbiology Recent Results (from the past 240 hour(s))  SARS Coronavirus 2 by RT PCR (hospital order, performed in [place] hospital lab) Nasopharyngeal Nasopharyngeal Swab     Status: None   Collection Time: 01/19/20  3:06 PM   Specimen: Nasopharyngeal Swab  Result Value Ref Range Status   SARS Coronavirus 2 NEGATIVE NEGATIVE Final    Comment: (NOTE) SARS-CoV-2 target nucleic acids are NOT DETECTED.  The SARS-CoV-2 RNA is generally detectable in upper and lower respiratory specimens during the acute phase of infection. The  lowest concentration of SARS-CoV-2 viral copies this assay can detect is 250 copies / mL. A negative result does not preclude SARS-CoV-2 infection and should not be used as the sole basis for treatment or other patient management decisions.  A negative result may occur with improper specimen collection / handling, submission of specimen other than nasopharyngeal swab, presence of viral mutation(s) within the areas targeted by this assay, and inadequate number of viral copies (<250 copies / mL). A negative result must be combined with clinical observations, patient history, and epidemiological information.  Fact Sheet for Patients:   StrictlyIdeas.no  Fact Sheet for Healthcare Providers: BankingDealers.co.za  This test is not yet approved or  cleared by the [place] FDA and has been authorized for detection and/or diagnosis of SARS-CoV-2 by FDA under an Emergency  Use Authorization (EUA).  This EUA will remain in effect (meaning this test can be used) for the duration of the COVID-19 declaration under Section 564(b)(1) of the Act, 21 U.S.C. section 360bbb-3(b)(1), unless the authorization is terminated or revoked sooner.  Performed at [facility], [address] [place], [place] [address]      Time coordinating discharge:  I have spent 35 minutes face to face with the patient and on the ward discussing the patients care, assessment, plan and disposition with other care givers. >50% of the time was devoted counseling the patient about the risks and benefits of treatment/Discharge disposition and coordinating care.   SIGNED:   Damita Lack, MD  [facility] 01/22/2020, 12:29 PM   If 7PM-7AM, please contact night-coverage

## 2020-01-22 NOTE — Plan of Care (Addendum)
  Problem: Acute Rehab PT Goals(only PT should resolve) Goal: Pt Will Go Supine/Side To Sit Outcome: Progressing Flowsheets (Taken 01/22/2020 1147) Pt will go Supine/Side to Sit: Independently Goal: Patient Will Transfer Sit To/From Stand Outcome: Progressing Flowsheets (Taken 01/22/2020 1147) Patient will transfer sit to/from stand: . with modified independence . with supervision Goal: Pt Will Transfer Bed To Chair/Chair To Bed Outcome: Progressing Flowsheets (Taken 01/22/2020 1147) Pt will Transfer Bed to Chair/Chair to Bed: . with supervision . min guard assist Goal: Pt Will Ambulate Outcome: Progressing Flowsheets (Taken 01/22/2020 1147) Pt will Ambulate: . 100 feet . with supervision . with rolling walker   11:47 AM , 01/22/20 Karlyn Agee, SPT Physical Therapy with [facility] [phone] office   During this treatment session, the therapist was present, participating in and directing the treatment.  2:22 PM, 01/22/20 Lonell Grandchild, MPT Physical Therapist with [facility] [phone] office [phone] mobile phone

## 2020-01-23 ENCOUNTER — Ambulatory Visit (HOSPITAL_COMMUNITY): Payer: Medicare Other

## 2020-01-23 ENCOUNTER — Encounter (HOSPITAL_COMMUNITY): Payer: Self-pay

## 2020-01-23 LAB — GLUCOSE, CAPILLARY: Glucose-Capillary: 26 mg/dL — CL (ref 70–99)

## 2020-01-24 ENCOUNTER — Other Ambulatory Visit: Payer: Self-pay | Admitting: *Deleted

## 2020-01-24 DIAGNOSIS — N186 End stage renal disease: Secondary | ICD-10-CM | POA: Diagnosis not present

## 2020-01-24 DIAGNOSIS — Z992 Dependence on renal dialysis: Secondary | ICD-10-CM | POA: Diagnosis not present

## 2020-01-24 NOTE — Patient Outreach (Signed)
[place] [facility]) Care Management  01/24/2020  CASY BRUNETTO 11-10-1945 [id number]   Telephone Assessment Post op hospital follow up 8/20-8/23 Hepatic encephalopathy  RN spoke briefly with the spouse [name]) who indicated pt was on his way to dialysis and requested a call back tomorrow.   Plan: Will follow up tomorrow concerning pt's recent discharge from the hospital and update plan of care accordingly with pt's progress.  Raina Mina, RN Care Management Coordinator [place] Office [phone]

## 2020-01-25 ENCOUNTER — Other Ambulatory Visit: Payer: Self-pay | Admitting: *Deleted

## 2020-01-25 DIAGNOSIS — I8500 Esophageal varices without bleeding: Secondary | ICD-10-CM | POA: Diagnosis not present

## 2020-01-25 DIAGNOSIS — E785 Hyperlipidemia, unspecified: Secondary | ICD-10-CM | POA: Diagnosis not present

## 2020-01-25 DIAGNOSIS — J9611 Chronic respiratory failure with hypoxia: Secondary | ICD-10-CM | POA: Diagnosis not present

## 2020-01-25 DIAGNOSIS — I132 Hypertensive heart and chronic kidney disease with heart failure and with stage 5 chronic kidney disease, or end stage renal disease: Secondary | ICD-10-CM | POA: Diagnosis not present

## 2020-01-25 DIAGNOSIS — E1169 Type 2 diabetes mellitus with other specified complication: Secondary | ICD-10-CM | POA: Diagnosis not present

## 2020-01-25 DIAGNOSIS — D631 Anemia in chronic kidney disease: Secondary | ICD-10-CM | POA: Diagnosis not present

## 2020-01-25 DIAGNOSIS — K766 Portal hypertension: Secondary | ICD-10-CM | POA: Diagnosis not present

## 2020-01-25 DIAGNOSIS — N186 End stage renal disease: Secondary | ICD-10-CM | POA: Diagnosis not present

## 2020-01-25 DIAGNOSIS — E1122 Type 2 diabetes mellitus with diabetic chronic kidney disease: Secondary | ICD-10-CM | POA: Diagnosis not present

## 2020-01-25 DIAGNOSIS — E11649 Type 2 diabetes mellitus with hypoglycemia without coma: Secondary | ICD-10-CM | POA: Diagnosis not present

## 2020-01-25 DIAGNOSIS — I5032 Chronic diastolic (congestive) heart failure: Secondary | ICD-10-CM | POA: Diagnosis not present

## 2020-01-25 NOTE — Patient Outreach (Signed)
[place] [facility]) Care Management  01/25/2020  Jeffrey [name] [birth year] [id number]   Telephone Assessment-Unsuccessful Requested a call back today due to dialysis on yesterday  RN attempted another outreach call however unsuccessful. RN able to leave a HIPAA approved voice message requesting a call back.  Plan: Will rescheduled another outreach next week for ongoing [facility] services.   Raina Mina, RN Care Management Coordinator [place] Office [phone]

## 2020-01-26 DIAGNOSIS — Z992 Dependence on renal dialysis: Secondary | ICD-10-CM | POA: Diagnosis not present

## 2020-01-26 DIAGNOSIS — I509 Heart failure, unspecified: Secondary | ICD-10-CM | POA: Diagnosis not present

## 2020-01-26 DIAGNOSIS — N186 End stage renal disease: Secondary | ICD-10-CM | POA: Diagnosis not present

## 2020-01-26 DIAGNOSIS — R0602 Shortness of breath: Secondary | ICD-10-CM | POA: Diagnosis not present

## 2020-01-26 LAB — TYPE AND SCREEN
ABO/RH(D): O POS
Antibody Screen: NEGATIVE
Unit division: 0

## 2020-01-26 LAB — BPAM RBC
Blood Product Expiration Date: 202109262359
Unit Type and Rh: 5100

## 2020-01-29 DIAGNOSIS — Z992 Dependence on renal dialysis: Secondary | ICD-10-CM | POA: Diagnosis not present

## 2020-01-29 DIAGNOSIS — N186 End stage renal disease: Secondary | ICD-10-CM | POA: Diagnosis not present

## 2020-01-29 DIAGNOSIS — E878 Other disorders of electrolyte and fluid balance, not elsewhere classified: Secondary | ICD-10-CM | POA: Diagnosis not present

## 2020-01-30 ENCOUNTER — Ambulatory Visit (HOSPITAL_COMMUNITY): Payer: Medicare Other | Admitting: Nurse Practitioner

## 2020-01-30 ENCOUNTER — Other Ambulatory Visit: Payer: Self-pay | Admitting: Vascular Surgery

## 2020-01-30 ENCOUNTER — Other Ambulatory Visit (HOSPITAL_COMMUNITY): Payer: Medicare Other

## 2020-01-30 DIAGNOSIS — I8500 Esophageal varices without bleeding: Secondary | ICD-10-CM | POA: Diagnosis not present

## 2020-01-30 DIAGNOSIS — I132 Hypertensive heart and chronic kidney disease with heart failure and with stage 5 chronic kidney disease, or end stage renal disease: Secondary | ICD-10-CM | POA: Diagnosis not present

## 2020-01-30 DIAGNOSIS — K766 Portal hypertension: Secondary | ICD-10-CM | POA: Diagnosis not present

## 2020-01-30 DIAGNOSIS — D631 Anemia in chronic kidney disease: Secondary | ICD-10-CM | POA: Diagnosis not present

## 2020-01-30 DIAGNOSIS — I5032 Chronic diastolic (congestive) heart failure: Secondary | ICD-10-CM | POA: Diagnosis not present

## 2020-01-30 DIAGNOSIS — J9611 Chronic respiratory failure with hypoxia: Secondary | ICD-10-CM | POA: Diagnosis not present

## 2020-01-30 DIAGNOSIS — E1122 Type 2 diabetes mellitus with diabetic chronic kidney disease: Secondary | ICD-10-CM | POA: Diagnosis not present

## 2020-01-30 DIAGNOSIS — N186 End stage renal disease: Secondary | ICD-10-CM | POA: Diagnosis not present

## 2020-01-30 DIAGNOSIS — Z992 Dependence on renal dialysis: Secondary | ICD-10-CM | POA: Diagnosis not present

## 2020-01-30 DIAGNOSIS — E785 Hyperlipidemia, unspecified: Secondary | ICD-10-CM | POA: Diagnosis not present

## 2020-01-30 DIAGNOSIS — E1169 Type 2 diabetes mellitus with other specified complication: Secondary | ICD-10-CM | POA: Diagnosis not present

## 2020-01-30 DIAGNOSIS — E11649 Type 2 diabetes mellitus with hypoglycemia without coma: Secondary | ICD-10-CM | POA: Diagnosis not present

## 2020-01-31 ENCOUNTER — Ambulatory Visit (HOSPITAL_COMMUNITY): Payer: Medicare Other | Admitting: Nurse Practitioner

## 2020-01-31 ENCOUNTER — Other Ambulatory Visit (HOSPITAL_COMMUNITY): Payer: Medicare Other

## 2020-01-31 DIAGNOSIS — R509 Fever, unspecified: Secondary | ICD-10-CM | POA: Diagnosis not present

## 2020-01-31 DIAGNOSIS — T82898A Other specified complication of vascular prosthetic devices, implants and grafts, initial encounter: Secondary | ICD-10-CM | POA: Diagnosis not present

## 2020-01-31 DIAGNOSIS — Z992 Dependence on renal dialysis: Secondary | ICD-10-CM | POA: Diagnosis not present

## 2020-01-31 DIAGNOSIS — N186 End stage renal disease: Secondary | ICD-10-CM | POA: Diagnosis not present

## 2020-02-01 ENCOUNTER — Other Ambulatory Visit: Payer: Self-pay

## 2020-02-01 ENCOUNTER — Other Ambulatory Visit: Payer: Self-pay | Admitting: *Deleted

## 2020-02-01 ENCOUNTER — Inpatient Hospital Stay (HOSPITAL_COMMUNITY): Payer: Medicare Other | Attending: Hematology

## 2020-02-01 ENCOUNTER — Inpatient Hospital Stay (HOSPITAL_BASED_OUTPATIENT_CLINIC_OR_DEPARTMENT_OTHER): Payer: Medicare Other | Admitting: Nurse Practitioner

## 2020-02-01 DIAGNOSIS — I5032 Chronic diastolic (congestive) heart failure: Secondary | ICD-10-CM | POA: Diagnosis not present

## 2020-02-01 DIAGNOSIS — K7210 Chronic hepatic failure without coma: Secondary | ICD-10-CM | POA: Insufficient documentation

## 2020-02-01 DIAGNOSIS — D500 Iron deficiency anemia secondary to blood loss (chronic): Secondary | ICD-10-CM | POA: Diagnosis not present

## 2020-02-01 DIAGNOSIS — Z803 Family history of malignant neoplasm of breast: Secondary | ICD-10-CM | POA: Insufficient documentation

## 2020-02-01 DIAGNOSIS — K922 Gastrointestinal hemorrhage, unspecified: Secondary | ICD-10-CM | POA: Insufficient documentation

## 2020-02-01 DIAGNOSIS — E538 Deficiency of other specified B group vitamins: Secondary | ICD-10-CM | POA: Insufficient documentation

## 2020-02-01 DIAGNOSIS — E785 Hyperlipidemia, unspecified: Secondary | ICD-10-CM | POA: Diagnosis not present

## 2020-02-01 DIAGNOSIS — N186 End stage renal disease: Secondary | ICD-10-CM | POA: Insufficient documentation

## 2020-02-01 DIAGNOSIS — K766 Portal hypertension: Secondary | ICD-10-CM | POA: Diagnosis not present

## 2020-02-01 DIAGNOSIS — E1169 Type 2 diabetes mellitus with other specified complication: Secondary | ICD-10-CM | POA: Diagnosis not present

## 2020-02-01 DIAGNOSIS — E611 Iron deficiency: Secondary | ICD-10-CM | POA: Diagnosis not present

## 2020-02-01 DIAGNOSIS — Z992 Dependence on renal dialysis: Secondary | ICD-10-CM | POA: Diagnosis not present

## 2020-02-01 DIAGNOSIS — D631 Anemia in chronic kidney disease: Secondary | ICD-10-CM | POA: Insufficient documentation

## 2020-02-01 DIAGNOSIS — Z800 Family history of malignant neoplasm of digestive organs: Secondary | ICD-10-CM | POA: Diagnosis not present

## 2020-02-01 DIAGNOSIS — Z79899 Other long term (current) drug therapy: Secondary | ICD-10-CM | POA: Insufficient documentation

## 2020-02-01 DIAGNOSIS — I132 Hypertensive heart and chronic kidney disease with heart failure and with stage 5 chronic kidney disease, or end stage renal disease: Secondary | ICD-10-CM | POA: Diagnosis not present

## 2020-02-01 DIAGNOSIS — E11649 Type 2 diabetes mellitus with hypoglycemia without coma: Secondary | ICD-10-CM | POA: Diagnosis not present

## 2020-02-01 DIAGNOSIS — Z87891 Personal history of nicotine dependence: Secondary | ICD-10-CM | POA: Diagnosis not present

## 2020-02-01 DIAGNOSIS — E1122 Type 2 diabetes mellitus with diabetic chronic kidney disease: Secondary | ICD-10-CM | POA: Diagnosis not present

## 2020-02-01 DIAGNOSIS — I8500 Esophageal varices without bleeding: Secondary | ICD-10-CM | POA: Diagnosis not present

## 2020-02-01 DIAGNOSIS — Z794 Long term (current) use of insulin: Secondary | ICD-10-CM | POA: Diagnosis not present

## 2020-02-01 DIAGNOSIS — J9611 Chronic respiratory failure with hypoxia: Secondary | ICD-10-CM | POA: Diagnosis not present

## 2020-02-01 LAB — CBC WITH DIFFERENTIAL/PLATELET
Abs Immature Granulocytes: 0 10*3/uL (ref 0.00–0.07)
Basophils Absolute: 0 10*3/uL (ref 0.0–0.1)
Basophils Relative: 1 %
Eosinophils Absolute: 0.2 10*3/uL (ref 0.0–0.5)
Eosinophils Relative: 6 %
HCT: 24.8 % — ABNORMAL LOW (ref 39.0–52.0)
Hemoglobin: 7.5 g/dL — ABNORMAL LOW (ref 13.0–17.0)
Immature Granulocytes: 0 %
Lymphocytes Relative: 8 %
Lymphs Abs: 0.2 10*3/uL — ABNORMAL LOW (ref 0.7–4.0)
MCH: 29.9 pg (ref 26.0–34.0)
MCHC: 30.2 g/dL (ref 30.0–36.0)
MCV: 98.8 fL (ref 80.0–100.0)
Monocytes Absolute: 0.3 10*3/uL (ref 0.1–1.0)
Monocytes Relative: 11 %
Neutro Abs: 2 10*3/uL (ref 1.7–7.7)
Neutrophils Relative %: 74 %
Platelets: 128 10*3/uL — ABNORMAL LOW (ref 150–400)
RBC: 2.51 MIL/uL — ABNORMAL LOW (ref 4.22–5.81)
RDW: 18 % — ABNORMAL HIGH (ref 11.5–15.5)
WBC: 2.6 10*3/uL — ABNORMAL LOW (ref 4.0–10.5)
nRBC: 0 % (ref 0.0–0.2)

## 2020-02-01 LAB — COMPREHENSIVE METABOLIC PANEL
ALT: 37 U/L (ref 0–44)
AST: 25 U/L (ref 15–41)
Albumin: 2.5 g/dL — ABNORMAL LOW (ref 3.5–5.0)
Alkaline Phosphatase: 304 U/L — ABNORMAL HIGH (ref 38–126)
Anion gap: 12 (ref 5–15)
BUN: 20 mg/dL (ref 8–23)
CO2: 26 mmol/L (ref 22–32)
Calcium: 8.6 mg/dL — ABNORMAL LOW (ref 8.9–10.3)
Chloride: 90 mmol/L — ABNORMAL LOW (ref 98–111)
Creatinine, Ser: 2.77 mg/dL — ABNORMAL HIGH (ref 0.61–1.24)
GFR calc Af Amer: 25 mL/min — ABNORMAL LOW (ref >60)
GFR calc non Af Amer: 22 mL/min — ABNORMAL LOW (ref >60)
Glucose, Bld: 179 mg/dL — ABNORMAL HIGH (ref 70–99)
Potassium: 3.5 mmol/L (ref 3.5–5.1)
Sodium: 128 mmol/L — ABNORMAL LOW (ref 135–145)
Total Bilirubin: 1.2 mg/dL (ref 0.3–1.2)
Total Protein: 7.2 g/dL (ref 6.5–8.1)

## 2020-02-01 LAB — IRON AND TIBC
Iron: 46 ug/dL (ref 45–182)
Saturation Ratios: 17 % — ABNORMAL LOW (ref 17.9–39.5)
TIBC: 264 ug/dL (ref 250–450)
UIBC: 218 ug/dL

## 2020-02-01 LAB — FERRITIN: Ferritin: 260 ng/mL (ref 24–336)

## 2020-02-01 NOTE — Progress Notes (Signed)
[name], [place] [address]   CLINIC:  Medical Oncology/Hematology  PCP:  Jeffrey Blitz, MD [place] [address] [phone]   REASON FOR VISIT: Follow-up for normocytic anemia   CURRENT THERAPY: Epogen injections given at dialysis.  And intermittent iron infusions  INTERVAL HISTORY:  [name] 74 y.o. male returns for routine follow-up for normocytic anemia.  Patient reports he is still on dialysis Monday Wednesday Friday.  He has had to go to the emergency room to receive blood transfusion. Denies any nausea, vomiting, or diarrhea. Denies any new pains. Had not noticed any recent bleeding such as epistaxis, hematuria or hematochezia. Denies recent chest pain on exertion, shortness of breath on minimal exertion, pre-syncopal episodes, or palpitations. Denies any numbness or tingling in hands or feet. Denies any recent fevers, infections, or recent hospitalizations. Patient reports appetite a 100% and energy level at 25%.     REVIEW OF SYSTEMS:  Review of Systems  Constitutional: Positive for fatigue.  Neurological: Positive for numbness.  Psychiatric/Behavioral: Positive for depression and sleep disturbance.  All other systems reviewed and are negative.    PAST MEDICAL/SURGICAL HISTORY:  Past Medical History:  Diagnosis Date   A-fib [name])    when initially stating dialysis january 2021 at [name] per spouse; never heard anything else about it   Alcoholic cirrhosis [name])    patient reports completing hep a and b vaccines in 2006   Anemia    has had 3 units of prbcs 2013   Anxiety    Arthritis    Asymptomatic gallstones    Ultrasound in 2006   C. difficile colitis    Cholelithiasis    Chronic kidney disease    Dialysis M/W/F/Sa   Depression    Diabetes ([name])    Duodenal ulcer with hemorrhage    per patient in 2006 or 2007, records have been requested   Dyspnea    low oxygen level -  has O2 2 L  all day   Esophageal varices ([name])    see [facility]   GERD (gastroesophageal reflux disease)    Heart burn    History of alcohol abuse    quit 05/2013   HOH (hard of hearing)    Hypertension    Liver cirrhosis ([name])    Splenomegaly    Ultrasound in 2006   Past Surgical History:  Procedure Laterality Date   AGILE CAPSULE N/A 09/20/2019   Procedure: AGILE CAPSULE;  Surgeon: Daneil Dolin, MD;  [facility];  Service: Endoscopy;  Laterality: N/A;   APPENDECTOMY     AV FISTULA PLACEMENT Left 08/17/2019   Procedure: ARTERIOVENOUS (AV) FISTULA CREATION VERSES ARTERIOVENOUS GRAFT;  Surgeon: [name], MD;  Location: [facility];  Service: Vascular;  Laterality: Left;   BASCILIC VEIN TRANSPOSITION Left 09/28/2019   Procedure: LEFT SECOND STAGE [place];  Surgeon: [name], MD;  Location: [facility];  Service: Vascular;  Laterality: Left;   BIOPSY  02/03/2018   Procedure: BIOPSY;  Surgeon: Daneil Dolin, MD;  [facility];  Service: Endoscopy;;  bx of gastric polyps   BIOPSY  09/19/2019   Procedure: BIOPSY;  Surgeon: Danie Binder, MD;  [facility];  Service: Endoscopy;;   CATARACT EXTRACTION Right    COLONOSCOPY  03/10/2005   Rectal polyp as described above, removed with snare. Left sided  diverticula. The remainder of the colonic mucosa appeared normal. Inflammed  polyp on path.   COLONOSCOPY  04/1999   Dr. Thea Silversmith polyps removed,  Path showed hyperplastic   COLONOSCOPY  10/2015   Dr. Britta Mccreedy: diverticulosis, single sessile tubular adenoma 3-11mm in size removed from descending colon.    COLONOSCOPY WITH ESOPHAGOGASTRODUODENOSCOPY (EGD)  03/31/2012   ONG:EXBMWUX AVMs. Colonic diverticulosis. tubular adenoma colon   COLONOSCOPY WITH ESOPHAGOGASTRODUODENOSCOPY (EGD)  06/2019   FORSYTH: small esophageal varices without high risk stigmata, single large AVM on lesser curvature in gastric body s/p APC ablation, gastric antral  and duodenal bulb polyposis. No significant source to explain transfusion dependent anemia. Colonoscopy with portal colopathy and diffuse edema, changes of Cdiff colitis on colonoscopy   ESOPHAGEAL BANDING N/A 02/03/2018   Procedure: ESOPHAGEAL BANDING;  Surgeon: Daneil Dolin, MD;  [facility];  Service: Endoscopy;  Laterality: N/A;   ESOPHAGOGASTRODUODENOSCOPY  01/15/2005   Three columns grade 1 to 2 esophageal varices, otherwise normal esophageal mucosa.  Esophagus was not manipulated otherwise./Nodularity of the antrum with overlying erosions, nonspecific finding. Path showed rare H.pylori   ESOPHAGOGASTRODUODENOSCOPY  09/2008   Dr. Gaylene Brooks columns of grade 2-3 esoph varices, only one column was prominent. Portal gastropathy, multiple gastrc polyps at antrum, two were 2cm with black eschar, bulbar polyps, bulbar erosions   ESOPHAGOGASTRODUODENOSCOPY  11/2004   Dr. Brantley Stage 3 esoph varices   ESOPHAGOGASTRODUODENOSCOPY  03/31/2012   RMR: 4 columns(3-GR 2, 1-GR1) non-bleeding esophageal varices, portal gastropathy, small HH, early GAVE, multiple gastric polyps    ESOPHAGOGASTRODUODENOSCOPY (EGD) WITH PROPOFOL N/A 02/03/2018   Dr. Gala Romney: Esophageal varices, 3 columns grade 1-2.  Portal hypertensive gastropathy.  Multiple gastric polyps, biopsy consistent with hyperplastic.   ESOPHAGOGASTRODUODENOSCOPY (EGD) WITH PROPOFOL N/A 09/19/2019   Fields: grade I and II esophageal varcies, mild portal hypertensive gastropathy, moderate gastritis but no H. pylori, single hyperplastic gastric polyp removed, obvious source for melena/transfusion dependent anemia not identified, may be due to friable gastric and duodenal mucosa in the setting of portal hypertension   GASTRIC VARICES BANDING  03/31/2012   Procedure: GASTRIC VARICES BANDING;  Surgeon: Daneil Dolin, MD;  [facility];  Service: Endoscopy;  Laterality: N/A;   GIVENS CAPSULE STUDY N/A 09/21/2019   Poor study  with a lot of debris obstructing much of the view of the first approximate 3 hours out of 6 hours.  No obvious source of bleeding identified.  Sequela of bleeding and old blood seen in the form of"whisps" of blood, blood flecks mostly toward the end of the study.   POLYPECTOMY  09/19/2019   Procedure: POLYPECTOMY;  Surgeon: Danie Binder, MD;  [facility];  Service: Endoscopy;;   UMBILICAL HERNIA REPAIR  2017   exploratory laparotomy, abdominal washout     SOCIAL HISTORY:  Social History   Socioeconomic History   Marital status: Married    Spouse name: Not on file   Number of children: 2   Years of education: Not on file   Highest education level: Not on file  Occupational History   Occupation: RETIRED    Employer: SELF EMPLOYED  Tobacco Use   Smoking status: Former Smoker    Packs/day: 2.00    Years: 25.00    Pack years: 50.00    Types: Cigarettes    Quit date: 01/28/1986    Years since quitting: 34.0   Smokeless tobacco: Never Used  Vaping Use   Vaping Use: Never used  Substance and Sexual Activity   Alcohol use: No    Comment:  quit in 05/2013   Drug use: No   Sexual activity: Not Currently  Other Topics Concern   Not on file  Social History Narrative   Two step children from second marriage (divorced) who live with him along with some grandchildren.    Social Determinants of Health   Financial Resource Strain:    Difficulty of Paying Living Expenses: Not on file  Food Insecurity: No Food Insecurity   Worried About Charity fundraiser in the Last Year: Never true   Ran Out of Food in the Last Year: Never true  Transportation Needs: No Transportation Needs   Lack of Transportation (Medical): No   Lack of Transportation (Non-Medical): No  Physical Activity:    Days of Exercise per Week: Not on file   Minutes of Exercise per Session: Not on file  Stress:    Feeling of Stress : Not on file  Social Connections:    Frequency of  Communication with Friends and Family: Not on file   Frequency of Social Gatherings with Friends and Family: Not on file   Attends Religious Services: Not on file   Active Member of Clubs or Organizations: Not on file   Attends Archivist Meetings: Not on file   Marital Status: Not on file  Intimate Partner Violence:    Fear of Current or Ex-Partner: Not on file   Emotionally Abused: Not on file   Physically Abused: Not on file   Sexually Abused: Not on file    FAMILY HISTORY:  Family History  Problem Relation Age of Onset   Colon cancer Father 51       deceased age 44   Breast cancer Sister        deceased   Diabetes Sister    Stroke Mother    Healthy Son    Healthy Daughter    Lung cancer Neg Hx    Ovarian cancer Neg Hx     CURRENT MEDICATIONS:  Outpatient Encounter Medications as of 02/01/2020  Medication Sig Note   acetaminophen (TYLENOL) 500 MG tablet Take 500 mg by mouth every 6 (six) hours as needed for mild pain.    Alcohol Swabs (ALCOHOL PREP) 70 % PADS Apply 1 each topically 3 (three) times daily.    Ascorbic Acid (VITAMIN C) 1000 MG tablet Take 1,000 mg by mouth 2 (two) times daily.    cholestyramine (QUESTRAN) 4 GM/DOSE powder Take by mouth.    furosemide (LASIX) 40 MG tablet Take 40 mg by mouth 2 (two) times daily.    gabapentin (NEURONTIN) 100 MG capsule TAKE 1 CAPSULE BY MOUTH AT BEDTIME    HUMALOG KWIKPEN 100 UNIT/ML KwikPen Inject 50 Units into the skin as needed. Sliding scale 01/19/2020: Patient has not had to take this in "a while" per spouse because blood sugars have been low.   HYDROcodone-acetaminophen (NORCO/VICODIN) 5-325 MG tablet Take 1 tablet by mouth 2 (two) times daily as needed.    hydrOXYzine (VISTARIL) 25 MG capsule Take 25 mg by mouth 3 (three) times daily as needed.    insulin degludec (TRESIBA FLEXTOUCH) 200 UNIT/ML FlexTouch Pen Inject 30 Units into the skin at bedtime. When blood sugars are up (Patient  taking differently: Inject 120 Units into the skin at bedtime. When blood sugars are up)    iron polysaccharides (NIFEREX) 150 MG capsule Take 150 mg by mouth at bedtime.     lactulose (CHRONULAC) 10 GM/15ML solution Take 45 mLs (30 g total) by mouth 3 (three)  times daily.    metoprolol succinate (TOPROL-XL) 25 MG 24 hr tablet Take 1 tablet (25 mg total) by mouth daily. Take at bedtime  on Monday ,Wednesday and Friday due to dialaylisis (Patient taking differently: Take 25 mg by mouth See admin instructions. Take 1 tablet twice a day, but take ONLY at bedtime on Monday ,Wednesday and Friday due to dialysis.)    milk thistle 175 MG tablet Take 175 mg by mouth 3 (three) times daily.     mirtazapine (REMERON) 15 MG tablet Take 15 mg by mouth at bedtime.    Multiple Vitamin (MULTIVITAMIN WITH MINERALS) TABS tablet Take 1 tablet by mouth in the morning.     NOVOFINE 32G X 6 MM MISC Inject into the skin.    nystatin (MYCOSTATIN/NYSTOP) powder Apply topically 3 (three) times daily. Into groin area and affected area    omega-3 acid ethyl esters (LOVAZA) 1 g capsule Take 2 g by mouth 2 (two) times daily.     omeprazole (PRILOSEC) 40 MG capsule Take 40 mg by mouth in the morning.     pravastatin (PRAVACHOL) 20 MG tablet Take 20 mg by mouth in the morning.     Probiotic Product (PROBIOTIC DAILY PO) Take 1 tablet by mouth in the morning.     rifaximin (XIFAXAN) 550 MG TABS tablet Take 1 tablet (550 mg total) by mouth 2 (two) times daily.    spironolactone (ALDACTONE) 50 MG tablet Take 50 mg by mouth in the morning.     tamsulosin (FLOMAX) 0.4 MG CAPS capsule Take 0.4 mg by mouth every evening.    thiamine 100 MG tablet Take 100 mg by mouth in the morning. Vitamin B-1    tiZANidine (ZANAFLEX) 2 MG tablet Take 2 mg by mouth 2 (two) times daily.    triamcinolone cream (KENALOG) 0.1 % Apply 1 application topically 2 (two) times daily as needed (dry skin Itching).     venlafaxine (EFFEXOR) 75 MG  tablet Take 75 mg by mouth 3 (three) times daily with meals.     vitamin B-12 (CYANOCOBALAMIN) 1000 MCG tablet Take 1,000 mcg by mouth daily.    No facility-administered encounter medications on file as of 02/01/2020.    ALLERGIES:  Allergies  Allergen Reactions   Chlorhexidine    Tape Itching and Rash     PHYSICAL EXAM:  ECOG Performance status: 1  Vitals:   02/01/20 1353  BP: (!) 146/56  Pulse: 90  Resp: 18  Temp: (!) 97.2 F (36.2 C)  SpO2: 98%   Filed Weights   02/01/20 1353  Weight: 230 lb 9.6 oz (104.6 kg)   Physical Exam Constitutional:      Appearance: Normal appearance. He is normal weight.  Cardiovascular:     Rate and Rhythm: Normal rate and regular rhythm.     Heart sounds: Normal heart sounds.  Pulmonary:     Effort: Pulmonary effort is normal.     Breath sounds: Normal breath sounds.  Abdominal:     General: Bowel sounds are normal.     Palpations: Abdomen is soft.  Musculoskeletal:        General: Normal range of motion.  Skin:    General: Skin is warm.  Neurological:     Mental Status: He is alert and oriented to person, place, and time. Mental status is at baseline.  Psychiatric:        Mood and Affect: Mood normal.        Behavior: Behavior normal.  Thought Content: Thought content normal.        Judgment: Judgment normal.      LABORATORY DATA:  I have reviewed the labs as listed.  CBC    Component Value Date/Time   WBC 2.6 (L) 02/01/2020 1315   RBC 2.51 (L) 02/01/2020 1315   HGB 7.5 (L) 02/01/2020 1315   HCT 24.8 (L) 02/01/2020 1315   HCT 28 02/25/2012 1034   PLT 128 (L) 02/01/2020 1315   PLT 125 02/25/2012 1034   MCV 98.8 02/01/2020 1315   MCV 82.0 02/25/2012 1034   MCH 29.9 02/01/2020 1315   MCHC 30.2 02/01/2020 1315   RDW 18.0 (H) 02/01/2020 1315   LYMPHSABS 0.2 (L) 02/01/2020 1315   MONOABS 0.3 02/01/2020 1315   EOSABS 0.2 02/01/2020 1315   BASOSABS 0.0 02/01/2020 1315   CMP Latest Ref Rng & Units 02/01/2020  01/22/2020 01/21/2020  Glucose 70 - 99 mg/dL 179(H) 226(H) 187(H)  BUN 8 - 23 mg/dL 20 25(H) 21  Creatinine 0.61 - 1.24 mg/dL 2.77(H) 3.54(H) 2.77(H)  Sodium 135 - 145 mmol/L 128(L) 123(L) 125(L)  Potassium 3.5 - 5.1 mmol/L 3.5 3.9 3.7  Chloride 98 - 111 mmol/L 90(L) 90(L) 89(L)  CO2 22 - 32 mmol/L 26 23 25   Calcium 8.9 - 10.3 mg/dL 8.6(L) 8.4(L) 8.4(L)  Total Protein 6.5 - 8.1 g/dL 7.2 - -  Total Bilirubin 0.3 - 1.2 mg/dL 1.2 - -  Alkaline Phos 38 - 126 U/L 304(H) - -  AST 15 - 41 U/L 25 - -  ALT 0 - 44 U/L 37 - -    All questions were answered to patient's stated satisfaction. Encouraged patient to call with any new concerns or questions before his next visit to the [name] and we can certain see him sooner, if needed.     ASSESSMENT & PLAN:  Anemia due to chronic blood loss 1. 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 -CTAP on 01/10/2016 showed splenomegaly. -Work-up included SPEP and immunofixation is negative. -Last iron infusions were on 12/13/2019. -His last blood transfusion was on 11/20/2019 -Labs done on 02/01/2020 showed hemoglobin 7.5, platelets 128, WBC 2.6, ferritin 260, percent saturation 17, creatinine 2.77 -We will give him 2 infusions of IV iron at this time. -Dialysis is giving him Epogen injections weekly. -She will return to the clinic in 8 weeks with repeat labs  2. Vitamin B12 deficiency: -Methylmalonic acid was elevated at 672. B12 was normal at 664. -He was given a B12 injection and placed on oral B12 1 mg tablets daily.  3.  End-stage renal disease: -Creatinine is 2.77 -He gets dialysis Monday Wednesday Friday -He receives Epogen injections at his dialysis.     Orders placed this encounter:  Orders Placed This Encounter  Procedures   CBC with Differential/Platelet   Comprehensive metabolic panel   Ferritin   Iron and TIBC   Lactate dehydrogenase   Vitamin B12   VITAMIN D 25 Hydroxy (Vit-D Deficiency,  Fractures)      Francene Finders, FNP-C [facility] [phone]

## 2020-02-01 NOTE — Assessment & Plan Note (Addendum)
1. 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 -CTAP on 01/10/2016 showed splenomegaly. -Work-up included SPEP and immunofixation is negative. -Last iron infusions were on 12/13/2019. -His last blood transfusion was on 11/20/2019 -Labs done on 02/01/2020 showed hemoglobin 7.5, platelets 128, WBC 2.6, ferritin 260, percent saturation 17, creatinine 2.77 -We will give him 2 infusions of IV iron at this time. -Dialysis is giving him Epogen injections weekly. -She will return to the clinic in 8 weeks with repeat labs  2. Vitamin B12 deficiency: -Methylmalonic acid was elevated at 672. B12 was normal at 664. -He was given a B12 injection and placed on oral B12 1 mg tablets daily.  3.  End-stage renal disease: -Creatinine is 2.77 -He gets dialysis Monday Wednesday Friday -He receives Epogen injections at his dialysis.

## 2020-02-01 NOTE — Patient Outreach (Signed)
[facility]) Care Management  02/01/2020  NAS WAFER 02-22-1946 [id number]   Telephone Assessment -Successful (DM)  This is a follow up call since pt's recent enrollment into the [facility] program and services. Spoke with spouse [name]) today who reports pt continue to do very well however busy with several appointments on his no dialysis days. States pt has a pending appointment today for labs. RN inquired on pt's plan of care as pt remains on track and [place] remains involved with a home visit that took place this morning. No other needs or tools as [facility] package was received and RN encouraged caregiver and pt to review this education material in assisting pt in managing his care. Verified contact number for this RN case manager if any additional tools are needed. Wife very appreciative with no other inquires. Reports pt's glucose levels are managed with readings around 95 fasting in the AM and 7/17 A1C 5.9. Praise caregiver for assisting pt in managing his diabetes.  Plan: Will follow up next month with ongoing case management services and continue to assist pt in managing his diabetes.  Note pt was discussed today in Multidisciplinary discussion.   Goals Addressed            This Visit's Progress   . [facility]N F/u 9/2   On track    CARE PLAN ENTRY (see longtitudinal plan of care for additional care plan information)  Objective:  Lab Results  Component Value Date   HGBA1C 5.9 (H) 12/16/2019 .   Lab Results  Component Value Date   CREATININE 2.73 (H) 01/06/2020   CREATININE 3.96 (H) 01/03/2020   CREATININE 3.13 (H) 01/02/2020 .   [name] No results found for: EGFR  Current Barriers:  [name] Knowledge Deficits related to basic Diabetes pathophysiology and self care/management . Knowledge Deficits related to medications used for management of diabetes  Case Manager Clinical Goal(s):  Over the next 90 days, patient will demonstrate improved adherence to prescribed  treatment plan for diabetes self care/management as evidenced by:  [name] Verbalize daily monitoring and recording of CBG within 30 days . Verbalize adherence to ADA/ carb modified diet within the next 30 days  Interventions:  . Provided education to patient about basic DM disease process . Reviewed medications with patient and discussed importance of medication adherence . Discussed plans with patient for ongoing care management follow up and provided patient with direct contact information for care management team . Provided patient with written educational materials related to hypo and hyperglycemia and importance of correct treatment . Reviewed scheduled/upcoming provider appointments including: all medications reviewed.  Patient Self Care Activities:  . Self administers oral medications as prescribed . Self administers insulin as prescribed . Attends all scheduled provider appointments . Checks blood sugars as prescribed and utilize hyper and hypoglycemia protocol as needed . Adheres to prescribed ADA/carb modified  Initial goal documentation        Raina Mina, RN Care Management Coordinator [place] [facility] [phone]

## 2020-02-02 DIAGNOSIS — N186 End stage renal disease: Secondary | ICD-10-CM | POA: Diagnosis not present

## 2020-02-02 DIAGNOSIS — Z992 Dependence on renal dialysis: Secondary | ICD-10-CM | POA: Diagnosis not present

## 2020-02-02 DIAGNOSIS — T82898A Other specified complication of vascular prosthetic devices, implants and grafts, initial encounter: Secondary | ICD-10-CM | POA: Diagnosis not present

## 2020-02-02 DIAGNOSIS — R509 Fever, unspecified: Secondary | ICD-10-CM | POA: Diagnosis not present

## 2020-02-06 ENCOUNTER — Other Ambulatory Visit: Payer: Self-pay

## 2020-02-06 ENCOUNTER — Ambulatory Visit: Payer: Medicare Other

## 2020-02-06 DIAGNOSIS — R339 Retention of urine, unspecified: Secondary | ICD-10-CM

## 2020-02-06 DIAGNOSIS — E11649 Type 2 diabetes mellitus with hypoglycemia without coma: Secondary | ICD-10-CM | POA: Diagnosis not present

## 2020-02-06 DIAGNOSIS — J9611 Chronic respiratory failure with hypoxia: Secondary | ICD-10-CM | POA: Diagnosis not present

## 2020-02-06 DIAGNOSIS — K766 Portal hypertension: Secondary | ICD-10-CM | POA: Diagnosis not present

## 2020-02-06 DIAGNOSIS — I5032 Chronic diastolic (congestive) heart failure: Secondary | ICD-10-CM | POA: Diagnosis not present

## 2020-02-06 DIAGNOSIS — E1169 Type 2 diabetes mellitus with other specified complication: Secondary | ICD-10-CM | POA: Diagnosis not present

## 2020-02-06 DIAGNOSIS — I8500 Esophageal varices without bleeding: Secondary | ICD-10-CM | POA: Diagnosis not present

## 2020-02-06 DIAGNOSIS — I132 Hypertensive heart and chronic kidney disease with heart failure and with stage 5 chronic kidney disease, or end stage renal disease: Secondary | ICD-10-CM | POA: Diagnosis not present

## 2020-02-06 DIAGNOSIS — D631 Anemia in chronic kidney disease: Secondary | ICD-10-CM | POA: Diagnosis not present

## 2020-02-06 DIAGNOSIS — E1122 Type 2 diabetes mellitus with diabetic chronic kidney disease: Secondary | ICD-10-CM | POA: Diagnosis not present

## 2020-02-06 DIAGNOSIS — N186 End stage renal disease: Secondary | ICD-10-CM | POA: Diagnosis not present

## 2020-02-06 DIAGNOSIS — E785 Hyperlipidemia, unspecified: Secondary | ICD-10-CM | POA: Diagnosis not present

## 2020-02-06 NOTE — Progress Notes (Signed)
Cath Change/ Replacement  Patient is present today for a catheter change due to urinary retention.  93ml of water was removed from the balloon, a 16FR foley cath was removed with out difficulty.  Patient was cleaned and prepped in a sterile fashion with betadine. A 16 FR foley cath was replaced into the bladder no complications were noted Urine return was noted 27ml and urine was yellow in color. The balloon was filled with 15ml of sterile water. A leg bag was attached for drainage. Patient was given proper instruction on catheter care.    Performed by: Valentina Lucks, LPN

## 2020-02-07 ENCOUNTER — Emergency Department (HOSPITAL_COMMUNITY)
Admission: EM | Admit: 2020-02-07 | Discharge: 2020-02-07 | Disposition: A | Discharge status: Home / Self Care | Payer: Medicare Other | Source: Home / Self Care

## 2020-02-07 ENCOUNTER — Encounter (HOSPITAL_COMMUNITY): Payer: Self-pay | Admitting: *Deleted

## 2020-02-07 ENCOUNTER — Inpatient Hospital Stay (HOSPITAL_COMMUNITY)
Admission: EM | Admit: 2020-02-07 | Discharge: 2020-02-10 | DRG: 432 | Disposition: A | Discharge status: Home Health | Payer: Medicare Other | Attending: Family Medicine | Admitting: Family Medicine

## 2020-02-07 ENCOUNTER — Other Ambulatory Visit: Payer: Self-pay

## 2020-02-07 DIAGNOSIS — Z79891 Long term (current) use of opiate analgesic: Secondary | ICD-10-CM | POA: Diagnosis not present

## 2020-02-07 DIAGNOSIS — I5032 Chronic diastolic (congestive) heart failure: Secondary | ICD-10-CM | POA: Diagnosis present

## 2020-02-07 DIAGNOSIS — M549 Dorsalgia, unspecified: Secondary | ICD-10-CM | POA: Diagnosis present

## 2020-02-07 DIAGNOSIS — N19 Unspecified kidney failure: Secondary | ICD-10-CM

## 2020-02-07 DIAGNOSIS — R0689 Other abnormalities of breathing: Secondary | ICD-10-CM

## 2020-02-07 DIAGNOSIS — E1122 Type 2 diabetes mellitus with diabetic chronic kidney disease: Secondary | ICD-10-CM | POA: Diagnosis not present

## 2020-02-07 DIAGNOSIS — R0600 Dyspnea, unspecified: Secondary | ICD-10-CM

## 2020-02-07 DIAGNOSIS — Z7189 Other specified counseling: Secondary | ICD-10-CM

## 2020-02-07 DIAGNOSIS — E8809 Other disorders of plasma-protein metabolism, not elsewhere classified: Secondary | ICD-10-CM | POA: Diagnosis present

## 2020-02-07 DIAGNOSIS — Z794 Long term (current) use of insulin: Secondary | ICD-10-CM

## 2020-02-07 DIAGNOSIS — D631 Anemia in chronic kidney disease: Secondary | ICD-10-CM | POA: Diagnosis not present

## 2020-02-07 DIAGNOSIS — R140 Abdominal distension (gaseous): Secondary | ICD-10-CM | POA: Diagnosis not present

## 2020-02-07 DIAGNOSIS — R509 Fever, unspecified: Secondary | ICD-10-CM | POA: Diagnosis present

## 2020-02-07 DIAGNOSIS — Z66 Do not resuscitate: Secondary | ICD-10-CM | POA: Diagnosis not present

## 2020-02-07 DIAGNOSIS — E782 Mixed hyperlipidemia: Secondary | ICD-10-CM | POA: Diagnosis not present

## 2020-02-07 DIAGNOSIS — R161 Splenomegaly, not elsewhere classified: Secondary | ICD-10-CM | POA: Diagnosis not present

## 2020-02-07 DIAGNOSIS — Z992 Dependence on renal dialysis: Secondary | ICD-10-CM | POA: Diagnosis not present

## 2020-02-07 DIAGNOSIS — I132 Hypertensive heart and chronic kidney disease with heart failure and with stage 5 chronic kidney disease, or end stage renal disease: Secondary | ICD-10-CM | POA: Diagnosis not present

## 2020-02-07 DIAGNOSIS — R109 Unspecified abdominal pain: Secondary | ICD-10-CM | POA: Diagnosis not present

## 2020-02-07 DIAGNOSIS — K3189 Other diseases of stomach and duodenum: Secondary | ICD-10-CM | POA: Diagnosis present

## 2020-02-07 DIAGNOSIS — K7460 Unspecified cirrhosis of liver: Secondary | ICD-10-CM | POA: Diagnosis not present

## 2020-02-07 DIAGNOSIS — K8020 Calculus of gallbladder without cholecystitis without obstruction: Secondary | ICD-10-CM | POA: Diagnosis not present

## 2020-02-07 DIAGNOSIS — Z87891 Personal history of nicotine dependence: Secondary | ICD-10-CM

## 2020-02-07 DIAGNOSIS — G8929 Other chronic pain: Secondary | ICD-10-CM | POA: Diagnosis not present

## 2020-02-07 DIAGNOSIS — I517 Cardiomegaly: Secondary | ICD-10-CM | POA: Diagnosis not present

## 2020-02-07 DIAGNOSIS — E119 Type 2 diabetes mellitus without complications: Secondary | ICD-10-CM

## 2020-02-07 DIAGNOSIS — K766 Portal hypertension: Secondary | ICD-10-CM | POA: Diagnosis present

## 2020-02-07 DIAGNOSIS — N400 Enlarged prostate without lower urinary tract symptoms: Secondary | ICD-10-CM | POA: Diagnosis present

## 2020-02-07 DIAGNOSIS — R0602 Shortness of breath: Secondary | ICD-10-CM | POA: Diagnosis not present

## 2020-02-07 DIAGNOSIS — Z79899 Other long term (current) drug therapy: Secondary | ICD-10-CM

## 2020-02-07 DIAGNOSIS — R188 Other ascites: Secondary | ICD-10-CM

## 2020-02-07 DIAGNOSIS — I10 Essential (primary) hypertension: Secondary | ICD-10-CM | POA: Diagnosis present

## 2020-02-07 DIAGNOSIS — N2581 Secondary hyperparathyroidism of renal origin: Secondary | ICD-10-CM | POA: Diagnosis not present

## 2020-02-07 DIAGNOSIS — N186 End stage renal disease: Secondary | ICD-10-CM | POA: Diagnosis not present

## 2020-02-07 DIAGNOSIS — J90 Pleural effusion, not elsewhere classified: Secondary | ICD-10-CM | POA: Diagnosis not present

## 2020-02-07 DIAGNOSIS — Z20822 Contact with and (suspected) exposure to covid-19: Secondary | ICD-10-CM | POA: Diagnosis present

## 2020-02-07 DIAGNOSIS — Z5321 Procedure and treatment not carried out due to patient leaving prior to being seen by health care provider: Secondary | ICD-10-CM | POA: Insufficient documentation

## 2020-02-07 DIAGNOSIS — E1165 Type 2 diabetes mellitus with hyperglycemia: Secondary | ICD-10-CM | POA: Diagnosis not present

## 2020-02-07 DIAGNOSIS — K7031 Alcoholic cirrhosis of liver with ascites: Secondary | ICD-10-CM | POA: Diagnosis not present

## 2020-02-07 DIAGNOSIS — R4182 Altered mental status, unspecified: Secondary | ICD-10-CM | POA: Insufficient documentation

## 2020-02-07 DIAGNOSIS — E871 Hypo-osmolality and hyponatremia: Secondary | ICD-10-CM | POA: Diagnosis not present

## 2020-02-07 LAB — COMPREHENSIVE METABOLIC PANEL
ALT: 22 U/L (ref 0–44)
AST: 28 U/L (ref 15–41)
Albumin: 3 g/dL — ABNORMAL LOW (ref 3.5–5.0)
Alkaline Phosphatase: 233 U/L — ABNORMAL HIGH (ref 38–126)
Anion gap: 17 — ABNORMAL HIGH (ref 5–15)
BUN: 45 mg/dL — ABNORMAL HIGH (ref 8–23)
CO2: 20 mmol/L — ABNORMAL LOW (ref 22–32)
Calcium: 9 mg/dL (ref 8.9–10.3)
Chloride: 83 mmol/L — ABNORMAL LOW (ref 98–111)
Creatinine, Ser: 4.53 mg/dL — ABNORMAL HIGH (ref 0.61–1.24)
GFR calc Af Amer: 14 mL/min — ABNORMAL LOW (ref >60)
GFR calc non Af Amer: 12 mL/min — ABNORMAL LOW (ref >60)
Glucose, Bld: 95 mg/dL (ref 70–99)
Potassium: 4.5 mmol/L (ref 3.5–5.1)
Sodium: 120 mmol/L — ABNORMAL LOW (ref 135–145)
Total Bilirubin: 1.3 mg/dL — ABNORMAL HIGH (ref 0.3–1.2)
Total Protein: 7.9 g/dL (ref 6.5–8.1)

## 2020-02-07 LAB — CBC WITH DIFFERENTIAL/PLATELET
Abs Immature Granulocytes: 0.01 10*3/uL (ref 0.00–0.07)
Basophils Absolute: 0 10*3/uL (ref 0.0–0.1)
Basophils Relative: 1 %
Eosinophils Absolute: 0.2 10*3/uL (ref 0.0–0.5)
Eosinophils Relative: 4 %
HCT: 28 % — ABNORMAL LOW (ref 39.0–52.0)
Hemoglobin: 8.9 g/dL — ABNORMAL LOW (ref 13.0–17.0)
Immature Granulocytes: 0 %
Lymphocytes Relative: 7 %
Lymphs Abs: 0.3 10*3/uL — ABNORMAL LOW (ref 0.7–4.0)
MCH: 29.7 pg (ref 26.0–34.0)
MCHC: 31.8 g/dL (ref 30.0–36.0)
MCV: 93.3 fL (ref 80.0–100.0)
Monocytes Absolute: 0.3 10*3/uL (ref 0.1–1.0)
Monocytes Relative: 6 %
Neutro Abs: 3.6 10*3/uL (ref 1.7–7.7)
Neutrophils Relative %: 82 %
Platelets: 155 10*3/uL (ref 150–400)
RBC: 3 MIL/uL — ABNORMAL LOW (ref 4.22–5.81)
RDW: 17.1 % — ABNORMAL HIGH (ref 11.5–15.5)
WBC: 4.4 10*3/uL (ref 4.0–10.5)
nRBC: 0 % (ref 0.0–0.2)

## 2020-02-07 LAB — AMMONIA: Ammonia: 32 umol/L (ref 9–35)

## 2020-02-07 NOTE — ED Triage Notes (Signed)
Pt with back pain so bed that he missed two dialysis as of today.  Pt with confusion and not eating per wife.  Pt alert and oriented to place, trouble with the month and the year.

## 2020-02-07 NOTE — ED Triage Notes (Addendum)
Pt had left without being seen earlier today.  Pt came back due to pain worse.  Pt is confused.  Pt had missed two separate times for dialysis.

## 2020-02-08 ENCOUNTER — Ambulatory Visit (HOSPITAL_COMMUNITY): Payer: Medicare Other

## 2020-02-08 ENCOUNTER — Emergency Department (HOSPITAL_COMMUNITY): Payer: Medicare Other

## 2020-02-08 ENCOUNTER — Inpatient Hospital Stay (HOSPITAL_COMMUNITY): Payer: Medicare Other

## 2020-02-08 ENCOUNTER — Other Ambulatory Visit: Payer: Self-pay

## 2020-02-08 DIAGNOSIS — Z794 Long term (current) use of insulin: Secondary | ICD-10-CM

## 2020-02-08 DIAGNOSIS — K7030 Alcoholic cirrhosis of liver without ascites: Secondary | ICD-10-CM | POA: Insufficient documentation

## 2020-02-08 DIAGNOSIS — K3189 Other diseases of stomach and duodenum: Secondary | ICD-10-CM | POA: Diagnosis present

## 2020-02-08 DIAGNOSIS — I5032 Chronic diastolic (congestive) heart failure: Secondary | ICD-10-CM | POA: Diagnosis present

## 2020-02-08 DIAGNOSIS — Z79891 Long term (current) use of opiate analgesic: Secondary | ICD-10-CM | POA: Diagnosis not present

## 2020-02-08 DIAGNOSIS — I120 Hypertensive chronic kidney disease with stage 5 chronic kidney disease or end stage renal disease: Secondary | ICD-10-CM | POA: Diagnosis not present

## 2020-02-08 DIAGNOSIS — M549 Dorsalgia, unspecified: Secondary | ICD-10-CM | POA: Diagnosis not present

## 2020-02-08 DIAGNOSIS — E1129 Type 2 diabetes mellitus with other diabetic kidney complication: Secondary | ICD-10-CM | POA: Diagnosis not present

## 2020-02-08 DIAGNOSIS — D631 Anemia in chronic kidney disease: Secondary | ICD-10-CM | POA: Diagnosis present

## 2020-02-08 DIAGNOSIS — E8809 Other disorders of plasma-protein metabolism, not elsewhere classified: Secondary | ICD-10-CM | POA: Diagnosis present

## 2020-02-08 DIAGNOSIS — I132 Hypertensive heart and chronic kidney disease with heart failure and with stage 5 chronic kidney disease, or end stage renal disease: Secondary | ICD-10-CM | POA: Diagnosis not present

## 2020-02-08 DIAGNOSIS — Z66 Do not resuscitate: Secondary | ICD-10-CM | POA: Diagnosis not present

## 2020-02-08 DIAGNOSIS — R161 Splenomegaly, not elsewhere classified: Secondary | ICD-10-CM | POA: Diagnosis present

## 2020-02-08 DIAGNOSIS — Z992 Dependence on renal dialysis: Secondary | ICD-10-CM | POA: Diagnosis not present

## 2020-02-08 DIAGNOSIS — K8020 Calculus of gallbladder without cholecystitis without obstruction: Secondary | ICD-10-CM | POA: Diagnosis not present

## 2020-02-08 DIAGNOSIS — K7460 Unspecified cirrhosis of liver: Secondary | ICD-10-CM | POA: Diagnosis not present

## 2020-02-08 DIAGNOSIS — E1122 Type 2 diabetes mellitus with diabetic chronic kidney disease: Secondary | ICD-10-CM

## 2020-02-08 DIAGNOSIS — Z79899 Other long term (current) drug therapy: Secondary | ICD-10-CM | POA: Diagnosis not present

## 2020-02-08 DIAGNOSIS — I10 Essential (primary) hypertension: Secondary | ICD-10-CM

## 2020-02-08 DIAGNOSIS — Z7189 Other specified counseling: Secondary | ICD-10-CM | POA: Diagnosis not present

## 2020-02-08 DIAGNOSIS — G8929 Other chronic pain: Secondary | ICD-10-CM | POA: Diagnosis present

## 2020-02-08 DIAGNOSIS — R188 Other ascites: Secondary | ICD-10-CM | POA: Diagnosis not present

## 2020-02-08 DIAGNOSIS — N400 Enlarged prostate without lower urinary tract symptoms: Secondary | ICD-10-CM | POA: Diagnosis present

## 2020-02-08 DIAGNOSIS — K766 Portal hypertension: Secondary | ICD-10-CM | POA: Diagnosis present

## 2020-02-08 DIAGNOSIS — J90 Pleural effusion, not elsewhere classified: Secondary | ICD-10-CM | POA: Diagnosis not present

## 2020-02-08 DIAGNOSIS — Z20822 Contact with and (suspected) exposure to covid-19: Secondary | ICD-10-CM | POA: Diagnosis present

## 2020-02-08 DIAGNOSIS — N2581 Secondary hyperparathyroidism of renal origin: Secondary | ICD-10-CM | POA: Diagnosis present

## 2020-02-08 DIAGNOSIS — K7031 Alcoholic cirrhosis of liver with ascites: Principal | ICD-10-CM

## 2020-02-08 DIAGNOSIS — E871 Hypo-osmolality and hyponatremia: Secondary | ICD-10-CM

## 2020-02-08 DIAGNOSIS — R109 Unspecified abdominal pain: Secondary | ICD-10-CM | POA: Diagnosis present

## 2020-02-08 DIAGNOSIS — E782 Mixed hyperlipidemia: Secondary | ICD-10-CM

## 2020-02-08 DIAGNOSIS — N186 End stage renal disease: Secondary | ICD-10-CM | POA: Diagnosis not present

## 2020-02-08 DIAGNOSIS — R140 Abdominal distension (gaseous): Secondary | ICD-10-CM | POA: Diagnosis not present

## 2020-02-08 DIAGNOSIS — I517 Cardiomegaly: Secondary | ICD-10-CM | POA: Diagnosis not present

## 2020-02-08 DIAGNOSIS — Z87891 Personal history of nicotine dependence: Secondary | ICD-10-CM | POA: Diagnosis not present

## 2020-02-08 DIAGNOSIS — R0602 Shortness of breath: Secondary | ICD-10-CM | POA: Diagnosis not present

## 2020-02-08 LAB — COMPREHENSIVE METABOLIC PANEL
ALT: 20 U/L (ref 0–44)
AST: 20 U/L (ref 15–41)
Albumin: 2.6 g/dL — ABNORMAL LOW (ref 3.5–5.0)
Alkaline Phosphatase: 200 U/L — ABNORMAL HIGH (ref 38–126)
Anion gap: 16 — ABNORMAL HIGH (ref 5–15)
BUN: 47 mg/dL — ABNORMAL HIGH (ref 8–23)
CO2: 21 mmol/L — ABNORMAL LOW (ref 22–32)
Calcium: 8.7 mg/dL — ABNORMAL LOW (ref 8.9–10.3)
Chloride: 84 mmol/L — ABNORMAL LOW (ref 98–111)
Creatinine, Ser: 4.61 mg/dL — ABNORMAL HIGH (ref 0.61–1.24)
GFR calc Af Amer: 14 mL/min — ABNORMAL LOW (ref >60)
GFR calc non Af Amer: 12 mL/min — ABNORMAL LOW (ref >60)
Glucose, Bld: 92 mg/dL (ref 70–99)
Potassium: 4.3 mmol/L (ref 3.5–5.1)
Sodium: 121 mmol/L — ABNORMAL LOW (ref 135–145)
Total Bilirubin: 1.2 mg/dL (ref 0.3–1.2)
Total Protein: 7.1 g/dL (ref 6.5–8.1)

## 2020-02-08 LAB — HEPATITIS B SURFACE ANTIGEN: Hepatitis B Surface Ag: NONREACTIVE

## 2020-02-08 LAB — CBG MONITORING, ED: Glucose-Capillary: 86 mg/dL (ref 70–99)

## 2020-02-08 LAB — CBC
HCT: 25.8 % — ABNORMAL LOW (ref 39.0–52.0)
Hemoglobin: 8.3 g/dL — ABNORMAL LOW (ref 13.0–17.0)
MCH: 29.9 pg (ref 26.0–34.0)
MCHC: 32.2 g/dL (ref 30.0–36.0)
MCV: 92.8 fL (ref 80.0–100.0)
Platelets: 136 10*3/uL — ABNORMAL LOW (ref 150–400)
RBC: 2.78 MIL/uL — ABNORMAL LOW (ref 4.22–5.81)
RDW: 16.9 % — ABNORMAL HIGH (ref 11.5–15.5)
WBC: 3.5 10*3/uL — ABNORMAL LOW (ref 4.0–10.5)
nRBC: 0 % (ref 0.0–0.2)

## 2020-02-08 LAB — PROTIME-INR
INR: 1.1 (ref 0.8–1.2)
Prothrombin Time: 14.2 seconds (ref 11.4–15.2)

## 2020-02-08 LAB — GLUCOSE, CAPILLARY
Glucose-Capillary: 135 mg/dL — ABNORMAL HIGH (ref 70–99)
Glucose-Capillary: 212 mg/dL — ABNORMAL HIGH (ref 70–99)

## 2020-02-08 LAB — SARS CORONAVIRUS 2 BY RT PCR (HOSPITAL ORDER, PERFORMED IN ~~LOC~~ HOSPITAL LAB): SARS Coronavirus 2: NEGATIVE

## 2020-02-08 LAB — APTT: aPTT: 40 seconds — ABNORMAL HIGH (ref 24–36)

## 2020-02-08 MED ORDER — PANTOPRAZOLE SODIUM 40 MG PO TBEC
40.0000 mg | DELAYED_RELEASE_TABLET | Freq: Every day | ORAL | Status: DC
  Administered 2020-02-08 – 2020-02-10 (×3): 40 mg via ORAL
  Filled 2020-02-08 (×3): qty 1

## 2020-02-08 MED ORDER — RIFAXIMIN 550 MG PO TABS
550.0000 mg | ORAL_TABLET | Freq: Two times a day (BID) | ORAL | Status: DC
  Administered 2020-02-08 – 2020-02-10 (×5): 550 mg via ORAL
  Filled 2020-02-08 (×5): qty 1

## 2020-02-08 MED ORDER — LACTULOSE 10 GM/15ML PO SOLN
30.0000 g | Freq: Three times a day (TID) | ORAL | Status: DC
  Administered 2020-02-08 – 2020-02-10 (×6): 30 g via ORAL
  Filled 2020-02-08 (×7): qty 60

## 2020-02-08 MED ORDER — FUROSEMIDE 40 MG PO TABS
40.0000 mg | ORAL_TABLET | Freq: Two times a day (BID) | ORAL | Status: DC
  Administered 2020-02-08 – 2020-02-10 (×5): 40 mg via ORAL
  Filled 2020-02-08 (×5): qty 1

## 2020-02-08 MED ORDER — DILTIAZEM HCL 30 MG PO TABS
30.0000 mg | ORAL_TABLET | Freq: Four times a day (QID) | ORAL | Status: DC
  Administered 2020-02-08 – 2020-02-10 (×6): 30 mg via ORAL
  Filled 2020-02-08 (×7): qty 1

## 2020-02-08 MED ORDER — METOPROLOL TARTRATE 25 MG PO TABS: 25.0000 mg | ORAL_TABLET | Freq: Two times a day (BID) | ORAL | Status: DC

## 2020-02-08 MED ORDER — INSULIN ASPART 100 UNIT/ML ~~LOC~~ SOLN
0.0000 [IU] | Freq: Every day | SUBCUTANEOUS | Status: DC
  Administered 2020-02-08 – 2020-02-09 (×2): 2 [IU] via SUBCUTANEOUS

## 2020-02-08 MED ORDER — ALTEPLASE 2 MG IJ SOLR: 2.0000 mg | Freq: Once | INTRAMUSCULAR | Status: DC | PRN

## 2020-02-08 MED ORDER — HEPARIN SODIUM (PORCINE) 1000 UNIT/ML DIALYSIS: 1000.0000 [IU] | INTRAMUSCULAR | Status: DC | PRN

## 2020-02-08 MED ORDER — METOPROLOL TARTRATE 50 MG PO TABS
50.0000 mg | ORAL_TABLET | Freq: Two times a day (BID) | ORAL | Status: DC
  Administered 2020-02-08 – 2020-02-10 (×4): 50 mg via ORAL
  Filled 2020-02-08 (×4): qty 1

## 2020-02-08 MED ORDER — SODIUM CHLORIDE 0.9 % IV SOLN
1.0000 g | Freq: Once | INTRAVENOUS | Status: AC
  Administered 2020-02-08: 1 g via INTRAVENOUS
  Filled 2020-02-08: qty 10

## 2020-02-08 MED ORDER — INSULIN ASPART 100 UNIT/ML ~~LOC~~ SOLN
0.0000 [IU] | Freq: Three times a day (TID) | SUBCUTANEOUS | Status: DC
  Administered 2020-02-09 (×2): 2 [IU] via SUBCUTANEOUS
  Administered 2020-02-09: 1 [IU] via SUBCUTANEOUS
  Administered 2020-02-10 (×2): 3 [IU] via SUBCUTANEOUS
  Filled 2020-02-08: qty 1

## 2020-02-08 MED ORDER — METOPROLOL SUCCINATE ER 25 MG PO TB24: 25.0000 mg | ORAL_TABLET | ORAL | Status: DC

## 2020-02-08 MED ORDER — SODIUM CHLORIDE 0.9 % IV SOLN: 100.0000 mL | INTRAVENOUS | Status: DC | PRN

## 2020-02-08 MED ORDER — HEPARIN SODIUM (PORCINE) 5000 UNIT/ML IJ SOLN
5000.0000 [IU] | Freq: Three times a day (TID) | INTRAMUSCULAR | Status: DC
  Administered 2020-02-08 – 2020-02-10 (×6): 5000 [IU] via SUBCUTANEOUS
  Filled 2020-02-08 (×8): qty 1

## 2020-02-08 MED ORDER — PRAVASTATIN SODIUM 10 MG PO TABS
20.0000 mg | ORAL_TABLET | Freq: Every morning | ORAL | Status: DC
  Administered 2020-02-08 – 2020-02-10 (×3): 20 mg via ORAL
  Filled 2020-02-08: qty 1
  Filled 2020-02-08: qty 2
  Filled 2020-02-08: qty 1

## 2020-02-08 MED ORDER — TAMSULOSIN HCL 0.4 MG PO CAPS
0.4000 mg | ORAL_CAPSULE | Freq: Every evening | ORAL | Status: DC
  Administered 2020-02-09: 0.4 mg via ORAL
  Filled 2020-02-08: qty 1

## 2020-02-08 NOTE — Progress Notes (Signed)
Patient seen and evaluated, chart reviewed, please see EMR for updated orders. Please see full H&P dictated by admitting physician Dr Josephine Cables for same date of service.   Brief Summary:- 74 y.o. male with medical history significant for alcoholic liver cirrhosis, ESRD(MWF), hypertension, diabetes mellitus type 2, hyperlipidemia, transfusion dependent anemia, portal hypertensive gastropathy and esophageal varices admitted on 02/08/2020 after admission back-to-back hemodialysis sessions   A/p 1) alcoholic liver cirrhosis with recurrent ascites--- patient apparently longer drinks alcohol -Per radiologist not enough fluid to attempt paracentesis at this time -Okay to restart Aldactone and Lasix -Also continue rifaximin and lactulose  2) ascites and bilateral pleural effusions--- discussed with radiologist Dr. Thornton Papas, not enough fluid for thoracentesis or paracentesis today per imaging studies  3)ESRD --typically Mondays Wednesdays Fridays -Patient continues to miss hemodialysis session frequently because he does not like to sit in the HD chair as outpatient -He states his back feels better when he lays down, so comes to the hospital to get hemodialysis she can be laying down rather than sitting up in the outpatient hemodialysis unit -Discussed with nephrologist -Plan is for serial/back-to-back hemodialysis sessions on 02/08/2020 on 02/09/2020  4)Tachyarrhythmia--- patient flips between sinus tach on a flutter with variable conduction -PTA was on metoprolol, probably noncompliant, resume metoprolol, add Cardizem Recent echo shows EF preserved  5)DM2- Use Novolog/Humalog Sliding scale insulin with Accu-Cheks/Fingersticks as ordered    6)Chronic diastolic heart failure -Echo done on 01/01/2020 showed LVEF of 70-75% -Volume overloaded, continue to use Lasix, Aldactone on hemodialysis to address volume status  7) chronic hyponatremia--- patient with history of underlying liver cirrhosis, no mental  status changes at this time  -Total care time included time spent coordinating care with nephrologist over 38 minutes  Patient seen and evaluated, chart reviewed, please see EMR for updated orders. Please see full H&P dictated by admitting physician Dr Josephine Cables for same date of service

## 2020-02-08 NOTE — H&P (Addendum)
History and Physical  Jeffrey [name] ZOX:[id number] DOB: December 05, 1945 DOA: 02/07/2020  Referring physician: Joseph Berkshire, MD PCP: Monico Blitz, MD  Patient coming from: Home  Chief Complaint:  Back Pain  HPI: Jeffrey [name] is a 74 y.o. male with medical history significant for alcoholic liver cirrhosis, ESRD(MWF), hypertension, diabetes mellitus type 2, hyperlipidemia, transfusion dependent anemia, portal hypertensive gastropathy and esophageal varices who presents to the emergency department due to several days of abdominal pain and back pain..  Abdominal pain was described as numbing sensation around the RLQ and back pain was in the lumbosacral area.  Patient states that he missed 2 sessions of dialysis because of the back pain.  He also complained of abdominal distention and believes that he has increased fluid in his abdomen.  He complained of some nausea and vomiting as well, but denies chest pain, shortness of breath, headache, fever.  ED Course:  In the emergency department, she was slightly tachypneic, otherwise other vital signs were within normal range.  Except for BP-161/69.  Work-up in the ED showed normocytic anemia, hyponatremia, PSA creatinine for the first of 4.5.  SARS coronavirus 2 was negative.  CT abdomen pelvis showed changes of cirrhosis with associated splenomegaly and moderate ascites.  Chest x-ray showed cardiomegaly with vascular congestion.  Patient was treated with IV ceftriaxone and hospitalist was asked to admit.  For further evaluation and management.  Review of Systems: Constitutional: Negative for chills and fever.  HENT: Negative for ear pain and sore throat.   Eyes: Negative for pain and visual disturbance.  Respiratory: Negative for cough, chest tightness and shortness of breath.   Cardiovascular: Negative for chest pain and palpitations.  Gastrointestinal: Positive for nausea, vomiting, abdominal pain and swelling.  Endocrine: Negative for polyphagia and  polyuria.  Genitourinary: Negative for decreased urine volume, dysuria, enuresis Musculoskeletal: Positive for bilateral leg swelling.  Egative for arthralgias and back pain.  Skin: Negative for color change and rash.  Allergic/Immunologic: Negative for immunocompromised state.  Neurological: Negative for tremors, syncope, speech difficulty, weakness, light-headedness and headaches.  Hematological: Does not bruise/bleed easily.  All other systems reviewed and are negative     Past Medical History:  Diagnosis Date  . A-fib [facility])    when initially stating dialysis january 2021 at [facility] per spouse; never heard anything else about it  . Alcoholic cirrhosis ([place])    patient reports completing hep a and b vaccines in 2006  . Anemia    has had 3 units of prbcs 2013  . Anxiety   . Arthritis   . Asymptomatic gallstones    Ultrasound in 2006  . C. difficile colitis   . Cholelithiasis   . Chronic kidney disease    Dialysis M/W/F/Sa  . Depression   . Diabetes ([place])   . Duodenal ulcer with hemorrhage    per patient in 2006 or 2007, records have been requested  . Dyspnea    low oxygen level -  has O2 2 L all day  . 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 GERD (gastroesophageal reflux disease)   . Heart burn   . 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 HOH (hard of hearing)   . Hypertension   . Liver cirrhosis ([place])   . Splenomegaly    Ultrasound in 2006   Past Surgical History:  Procedure Laterality Date  . AGILE CAPSULE N/A 09/20/2019   Procedure: AGILE CAPSULE;  Surgeon: Daneil Dolin,  MD;  [facility];  Service: Endoscopy;  Laterality: N/A;  . APPENDECTOMY    . AV FISTULA PLACEMENT Left 08/17/2019   Procedure: ARTERIOVENOUS (AV) FISTULA CREATION VERSES ARTERIOVENOUS GRAFT;  Surgeon: [name], MD;  Location: [place];  Service: Vascular;  Laterality: Left;  . BASCILIC VEIN TRANSPOSITION Left 09/28/2019   Procedure: LEFT SECOND STAGE BASCILIC VEIN  TRANSPOSITION;  Surgeon: [name], MD;  Location: [place];  Service: Vascular;  Laterality: Left;  . BIOPSY  02/03/2018   Procedure: BIOPSY;  Surgeon: Daneil Dolin, MD;  [facility];  Service: Endoscopy;;  bx of gastric polyps  . BIOPSY  09/19/2019   Procedure: BIOPSY;  Surgeon: Danie Binder, MD;  [facility];  Service: Endoscopy;;  . CATARACT EXTRACTION Right   . COLONOSCOPY  03/10/2005   Rectal polyp as described above, removed with snare. Left sided  diverticula. The remainder of the colonic mucosa appeared normal. Inflammed polyp on path.  . COLONOSCOPY  04/1999   Dr. Thea Silversmith polyps removed,  Path showed hyperplastic  . COLONOSCOPY  10/2015   Dr. Britta Mccreedy: diverticulosis, single sessile tubular adenoma 3-19mm in size removed from descending colon.   . COLONOSCOPY WITH ESOPHAGOGASTRODUODENOSCOPY (EGD)  03/31/2012   FBP:ZWCHENI AVMs. Colonic diverticulosis. tubular adenoma colon  . COLONOSCOPY WITH ESOPHAGOGASTRODUODENOSCOPY (EGD)  06/2019   FORSYTH: small esophageal varices without high risk stigmata, single large AVM on lesser curvature in gastric body s/p APC ablation, gastric antral and duodenal bulb polyposis. No significant source to explain transfusion dependent anemia. Colonoscopy with portal colopathy and diffuse edema, changes of Cdiff colitis on colonoscopy  . ESOPHAGEAL BANDING N/A 02/03/2018   Procedure: ESOPHAGEAL BANDING;  Surgeon: Daneil Dolin, MD;  [facility];  Service: Endoscopy;  Laterality: N/A;  . ESOPHAGOGASTRODUODENOSCOPY  01/15/2005   Three columns grade 1 to 2 esophageal varices, otherwise normal esophageal mucosa.  Esophagus was not manipulated otherwise./Nodularity of the antrum with overlying erosions, nonspecific finding. Path showed rare H.pylori  . ESOPHAGOGASTRODUODENOSCOPY  09/2008   Dr. Gaylene Brooks columns of grade 2-3 esoph varices, only one column was prominent. Portal gastropathy, multiple gastrc polyps at  antrum, two were 2cm with black eschar, bulbar polyps, bulbar erosions  . ESOPHAGOGASTRODUODENOSCOPY  11/2004   Dr. Brantley Stage 3 esoph varices  . ESOPHAGOGASTRODUODENOSCOPY  03/31/2012   RMR: 4 columns(3-GR 2, 1-GR1) non-bleeding esophageal varices, portal gastropathy, small HH, early GAVE, multiple gastric polyps   . ESOPHAGOGASTRODUODENOSCOPY (EGD) WITH PROPOFOL N/A 02/03/2018   Dr. Gala Romney: Esophageal varices, 3 columns grade 1-2.  Portal hypertensive gastropathy.  Multiple gastric polyps, biopsy consistent with hyperplastic.  [name] ESOPHAGOGASTRODUODENOSCOPY (EGD) WITH PROPOFOL N/A 09/19/2019   Fields: grade I and II esophageal varcies, mild portal hypertensive gastropathy, moderate gastritis but no H. pylori, single hyperplastic gastric polyp removed, obvious source for melena/transfusion dependent anemia not identified, may be due to friable gastric and duodenal mucosa in the setting of portal hypertension  . GASTRIC VARICES BANDING  03/31/2012   Procedure: GASTRIC VARICES BANDING;  Surgeon: Daneil Dolin, MD;  [facility];  Service: Endoscopy;  Laterality: N/A;  . GIVENS CAPSULE STUDY N/A 09/21/2019   Poor study with a lot of debris obstructing much of the view of the first approximate 3 hours out of 6 hours.  No obvious source of bleeding identified.  Sequela of bleeding and old blood seen in the form of"whisps" of blood, blood flecks mostly toward the end of the study.  [name]  POLYPECTOMY  09/19/2019   Procedure: POLYPECTOMY;  Surgeon: Danie Binder, MD;  [facility];  Service: Endoscopy;;  . UMBILICAL HERNIA REPAIR  2017   exploratory laparotomy, abdominal washout    Social History:  reports that he quit smoking about 34 years ago. His smoking use included cigarettes. He has a 50.00 pack-year smoking history. He has never used smokeless tobacco. He reports that he does not drink alcohol and does not use drugs.   Allergies  Allergen Reactions  . Chlorhexidine   .  Tape Itching and Rash    Family History  Problem Relation Age of Onset  . Colon cancer Father 60       deceased age 45  . Breast cancer Sister        deceased  . Diabetes Sister   . Stroke Mother   . Healthy Son   . Healthy Daughter   . Lung cancer Neg Hx   . Ovarian cancer Neg Hx       Prior to Admission medications   Medication Sig Start Date End Date Taking? Authorizing Provider  acetaminophen (TYLENOL) 500 MG tablet Take 500 mg by mouth every 6 (six) hours as needed for mild pain.    [provider]  Alcohol Swabs (ALCOHOL PREP) 70 % PADS Apply 1 each topically 3 (three) times daily. 01/31/20   [provider]  Ascorbic Acid (VITAMIN C) 1000 MG tablet Take 1,000 mg by mouth 2 (two) times daily.    [provider]  cholestyramine Lucrezia Starch) 4 GM/DOSE powder Take by mouth. 01/31/20   [provider]  furosemide (LASIX) 40 MG tablet Take 40 mg by mouth 2 (two) times daily. 12/29/19   [provider]  gabapentin (NEURONTIN) 100 MG capsule TAKE 1 CAPSULE BY MOUTH AT BEDTIME 01/30/20   Waynetta Sandy, MD  HUMALOG KWIKPEN 100 UNIT/ML KwikPen Inject 50 Units into the skin as needed. Sliding scale 11/05/19   [provider]  HYDROcodone-acetaminophen (NORCO/VICODIN) 5-325 MG tablet Take 1 tablet by mouth 2 (two) times daily as needed. 01/16/20   [provider]  hydrOXYzine (VISTARIL) 25 MG capsule Take 25 mg by mouth 3 (three) times daily as needed. 01/23/20   [provider]  insulin degludec (TRESIBA FLEXTOUCH) 200 UNIT/ML FlexTouch Pen Inject 30 Units into the skin at bedtime. When blood sugars are up Patient taking differently: Inject 120 Units into the skin at bedtime. When blood sugars are up 01/06/20   Barton Dubois, MD  iron polysaccharides (NIFEREX) 150 MG capsule Take 150 mg by mouth at bedtime.     [provider]  lactulose (CHRONULAC) 10 GM/15ML solution Take 45 mLs (30 g total) by mouth 3  (three) times daily. 01/06/20   Barton Dubois, MD  metoprolol succinate (TOPROL-XL) 25 MG 24 hr tablet Take 1 tablet (25 mg total) by mouth daily. Take at bedtime  on Monday ,Wednesday and Friday due to dialaylisis Patient taking differently: Take 25 mg by mouth See admin instructions. Take 1 tablet twice a day, but take ONLY at bedtime on Monday ,Wednesday and Friday due to dialysis. 01/06/20   Barton Dubois, MD  milk thistle 175 MG tablet Take 175 mg by mouth 3 (three) times daily.     [provider]  mirtazapine (REMERON) 15 MG tablet Take 15 mg by mouth at bedtime. 01/31/20   [provider]  Multiple Vitamin (MULTIVITAMIN WITH MINERALS) TABS tablet Take 1 tablet by mouth in the morning.  [provider]  NOVOFINE 32G X 6 MM MISC Inject into the skin. 01/31/20   [provider]  nystatin (MYCOSTATIN/NYSTOP) powder Apply topically 3 (three) times daily. Into groin area and affected area 01/06/20   Barton Dubois, MD  omega-3 acid ethyl esters (LOVAZA) 1 g capsule Take 2 g by mouth 2 (two) times daily.  04/07/19   [provider]  omeprazole (PRILOSEC) 40 MG capsule Take 40 mg by mouth in the morning.     [provider]  pravastatin (PRAVACHOL) 20 MG tablet Take 20 mg by mouth in the morning.     [provider]  Probiotic Product (PROBIOTIC DAILY PO) Take 1 tablet by mouth in the morning.     [provider]  rifaximin (XIFAXAN) 550 MG TABS tablet Take 1 tablet (550 mg total) by mouth 2 (two) times daily. 01/13/19   Mahala Menghini, PA-C  spironolactone (ALDACTONE) 50 MG tablet Take 50 mg by mouth in the morning.     [provider]  tamsulosin (FLOMAX) 0.4 MG CAPS capsule Take 0.4 mg by mouth every evening.    [provider]  thiamine 100 MG tablet Take 100 mg by mouth in the morning. Vitamin B-1    [provider]  tiZANidine (ZANAFLEX) 2 MG tablet Take 2 mg by mouth 2 (two) times daily. 01/31/20    [provider]  triamcinolone cream (KENALOG) 0.1 % Apply 1 application topically 2 (two) times daily as needed (dry skin Itching).  12/30/17   [provider]  venlafaxine (EFFEXOR) 75 MG tablet Take 75 mg by mouth 3 (three) times daily with meals.     [provider]  vitamin B-12 (CYANOCOBALAMIN) 1000 MCG tablet Take 1,000 mcg by mouth daily.    [provider]    Physical Exam: BP (!) 144/69   Pulse 90   Temp 97.7 F (36.5 C) (Oral)   Resp 20   SpO2 92%   . General: 74 y.o. year-old male well developed well nourished in no acute distress.  Alert and oriented x3. [name] HEENT: NCAT, EOMI . Neck: Supple, trachea medial . Cardiovascular: Regular rate and rhythm with no rubs or gallops.  No thyromegaly or JVD noted.  2/4 pulses in all 4 extremities. [name] Respiratory: Clear to auscultation with no wheezes or rales. Good inspiratory effort. . Abdomen: Soft, tenderness to palpation.  Abdominal distention with normal bowel sounds x4 quadrants. . Muskuloskeletal: No cyanosis, +2 lower extremity edema bilaterally to the knees.   . Neuro: CN II-XII intact, strength, sensation, reflexes . Skin: No ulcerative lesions noted or rashes . Psychiatry: Judgement and insight appear normal. Mood is appropriate for condition and setting          Labs on Admission:  Basic Metabolic Panel: Recent Labs  Lab 02/01/20 1315 02/07/20 2143  NA 128* 120*  K 3.5 4.5  CL 90* 83*  CO2 26 20*  GLUCOSE 179* 95  BUN 20 45*  CREATININE 2.77* 4.53*  CALCIUM 8.6* 9.0   Liver Function Tests: Recent Labs  Lab 02/01/20 1315 02/07/20 2143  AST 25 28  ALT 37 22  ALKPHOS 304* 233*  BILITOT 1.2 1.3*  PROT 7.2 7.9  ALBUMIN 2.5* 3.0*   No results for input(s): LIPASE, AMYLASE in the last 168 hours. Recent Labs  Lab 02/07/20 2143  AMMONIA 32   CBC: Recent Labs  Lab 02/01/20 1315 02/07/20 2143  WBC 2.6* 4.4  NEUTROABS 2.0 3.6  HGB 7.5*  8.9*  HCT 24.8* 28.0*  MCV 98.8  93.3  PLT 128* 155   Cardiac Enzymes: No results for input(s): CKTOTAL, CKMB, CKMBINDEX, TROPONINI in the last 168 hours.  BNP (last 3 results) No results for input(s): BNP in the last 8760 hours.  ProBNP (last 3 results) No results for input(s): PROBNP in the last 8760 hours.  CBG: No results for input(s): GLUCAP in the last 168 hours.  Radiological Exams on Admission: CT ABDOMEN PELVIS WO CONTRAST  Result Date: 02/08/2020 CLINICAL DATA:  Abdominal pain, distention EXAM: CT ABDOMEN AND PELVIS WITHOUT CONTRAST TECHNIQUE: Multidetector CT imaging of the abdomen and pelvis was performed following the standard protocol without IV contrast. COMPARISON:  01/01/2020 FINDINGS: Lower chest: Small bilateral pleural effusions. Left base atelectasis. Coronary artery calcifications. Heart is normal size. Hepatobiliary: Changes of cirrhosis with nodular contours. Multiple gallstones within the gallbladder. No focal hepatic abnormality or biliary ductal dilatation. Pancreas: No focal abnormality or ductal dilatation. Spleen: Splenomegaly with a craniocaudal length of 17.3 cm. No focal abnormality. Adrenals/Urinary Tract: No adrenal abnormality. No focal renal abnormality. No stones or hydronephrosis. Urinary bladder is unremarkable. Foley catheter present in the bladder. Stomach/Bowel: None Stomach, large and small bowel grossly unremarkable. Few scattered left colonic diverticula. Vascular/Lymphatic: Heavily calcified aorta and iliac vessels. No evidence of aneurysm or adenopathy. Reproductive: No visible focal abnormality. Other: Moderate ascites in the abdomen and pelvis. Musculoskeletal: No acute bony abnormality. Degenerative disc and facet disease throughout the lumbar spine. IMPRESSION: Changes of cirrhosis with associated splenomegaly and moderate ascites. Cholelithiasis. Aortic atherosclerosis.  Coronary artery disease. Small bilateral pleural effusions.  Left base atelectasis. Electronically Signed    By: Rolm Baptise M.D.   On: 02/08/2020 02:00   DG Chest Port 1 View  Result Date: 02/08/2020 CLINICAL DATA:  Shortness of breath EXAM: PORTABLE CHEST 1 VIEW COMPARISON:  01/19/2020 FINDINGS: Right dialysis catheter remains in place, unchanged. Cardiomegaly, vascular congestion. No confluent opacities or effusions. No acute bony abnormality. IMPRESSION: Cardiomegaly, vascular congestion. Electronically Signed   By: Rolm Baptise M.D.   On: 02/08/2020 00:52    EKG: I independently viewed the EKG done and my findings are as followed: EKG not done in the ED.  Assessment/Plan Present on Admission: . Alcoholic cirrhosis of liver with ascites ([place]) . Essential hypertension, benign . Mixed hyperlipidemia . 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[place]) . Hyponatremia  Active Problems:   Alcoholic cirrhosis of liver with ascites ([facility])   Essential hypertension, benign   Mixed hyperlipidemia   Type 2 diabetes mellitus ([facility])   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[facility])   Hyponatremia   Bilateral pleural effusion   Back pain   Alcoholic cirrhosis of liver with ascites  Hypoalbuminemia  CT abdomen pelvis showed changes of cirrhosis with associated splenomegaly and moderate ascites.   Interventional radiologist will be consulted for possible paracentesis Continue home Lasix and spironolactone after HD Continue rifaximin and lactulose Continue metoprolol and Protonix  Bilateral pleural effusion CT abdomen and pelvis showed small bilateral pleural effusion and left base atelectasis Personal review of CT showed worse left pleural effusion interventional radiology will be consulted for possible thoracentesis and subsequent pleural fluid analysis  ESRD on hemodialysis Monday Wednesday Friday Patient missed 2 outpatient dialysis sessions  Nephrology will be consulted for HD in the morning  Chronic low back pain Abdominal pain This is secondary to degenerative disc disease Patient is currently not in any  pain Patient currently without abdominal pain  Type II diabetes mellitus,  Continue insulin sliding scale and hypoglycemia protocol   Hyponatremia (chronic) This is possibly secondary to volume overload.  Patient will go to dialysis in the morning with hope for possible improvement in sodium levels.    Essential Hypertension Continue metoprolol after dialysis.  Chronic respiratory failure on 2L [place] at home.  Secondary to diastolic heart failure Continue oxygen supplementation; IS/ Flutter  Chronic diastolic heart failure -Echo done on 01/01/2020 showed LVEF of 70-75% Continue supportive care. Patient will go to dialysis in the morning.  Plan to resume Lasix, BB and Aldactone post HD.   BPH Continue flomax   HLD Continue pravastatin    DVT prophylaxis: Heparin subcu  Code Status: Full code (confirmed with patient)  Family Communication: None at bedside  Disposition Plan:  Patient is from:                        home Anticipated DC to:                   SNF or family members home Anticipated DC date:               2-3 days Anticipated DC barriers:         Patient currently unstable to discharge at this time due to pain volume overloaded and possibly requiring paracentesis, thoracentesis and hemodialysis.   Consults called: Nephrology, interventional radiology  Admission status: Inpatient  Bernadette Hoit MD [facility]  If 7PM-7AM, please contact night-coverage [email or web address] Password [email or web address]  02/08/2020, 5:51 AM

## 2020-02-08 NOTE — Progress Notes (Signed)
Patient HR in the 150s-160s this evening during dialysis. Dialysis RN remained at bedside. EKG obtained as ordered. Notified Dr. Denton Brick of results. Patient placed on telemetry. HR dropped to low 100s, sinus tachy. Notified Dr. Denton Brick. Stated okay to hold off on ordered diltiazem at this time since HR decreased, patient remains asymptomatic and still having dialysis treatment.

## 2020-02-08 NOTE — Consult Note (Signed)
ESRD Consult Note [facility]  Requesting provider: Roxan Hockey, MD Reason for consult: ESRD, provision of dialysis  Outpatient dialysis unit: [facility] Outpatient dialysis schedule: MWF  Assessment/Recommendations: Jeffrey [name] is a 74 y.o. male with a past medical history notable for ESRD on HD admitted with abdominal distension/swelling and back pain.  ESRD: Outpatient orders: 4 hrs, edw 109kg, 2k, 2cal, 350/600. No heparin EDW109 kgHD Bath 2K/2.5CaTime 4 hoursHeparin none. AccessLUE AVFand TDC. BFR 350DFR 600 -will need outpatient dialysis records  Volume/ hypertension: EDW 109kg. Attempt to achieve 3-4L UF with HD -continue with home lasix and aldactone as well as metoprolol  Hyponatremia -secondary to liver disease and free water retention. Overall poor prognostic indicator in cirrhosis  Anemia of Chronic Kidney Disease, history of transfusion dependent anemia: Hemoglobin 8.3 -We will need records from his outpatient unit, per review of records previously was on epo 10k units. Iron panel ordered  Secondary Hyperparathyroidism/Hyperphosphatemia: check phos  Vascular access: tdc and lue avf.  Cirrhosis -mgmt per primary service  Ascites, abdominal distension -paracentesis pending, IR consulted  Bilateral pleural effusions -possible thoracentesis, IR consulted  DM2 -mgmt per primary service  Back pain secondary to degenerative disk disease, mgmt per primary  Additional recommendations: - Dose all meds for creatinine clearance < 10 ml/min  - Unless absolutely necessary, no MRIs with gadolinium.  - Implement save arm precautions.  Prefer needle sticks in the dorsum of the hands or wrists.  No blood pressure measurements in arm. - If blood transfusion is requested during hemodialysis sessions, please alert [place] prior to the session.  - If a hemodialysis catheter line culture is requested, please alert [place] as only hemodialysis nurses are able  to collect those specimens.   Recommendations were discussed with the primary team.  [name], MD [facility]  History of Present Illness: Jeffrey [name] is a/an 74 y.o. male with a past medical history of ESRD, hypertension, diabetes mellitus type 2, hyperlipidemia, transfusion dependent anemia, portal hypertensive gastropathy, esophageal varices, alcoholic liver cirrhosis, multiple admissions to [facility] presents with worsening back pain and abdominal pain.  He does report having degenerative disc disease in his back.  Because of this, he has missed his last 2 dialysis treatments.  He does report worsening swelling in his abdomen because of this as well as worsening swelling in his lower extremities.  He does report that they have been utilizing his left upper extremity fistula and his catheter still in place.  He does report some shortness of breath as well.  Denies any chest pains, dizziness, nausea/vomiting/diarrhea.   Medications:  Current Facility-Administered Medications  Medication Dose Route Frequency Provider Last Rate Last Admin  . furosemide (LASIX) tablet 40 mg  40 mg Oral BID Adefeso, Oladapo, DO   40 mg at 02/08/20 0815  . heparin injection 5,000 Units  5,000 Units Subcutaneous Q8H Adefeso, Oladapo, DO   5,000 Units at 02/08/20 0520  . insulin aspart (novoLOG) injection 0-5 Units  0-5 Units Subcutaneous QHS Adefeso, Oladapo, DO      . insulin aspart (novoLOG) injection 0-6 Units  0-6 Units Subcutaneous TID WC Adefeso, Oladapo, DO      . lactulose (CHRONULAC) 10 GM/15ML solution 30 g  30 g Oral TID Adefeso, Oladapo, DO   30 g at 02/08/20 0815  . metoprolol succinate (TOPROL-XL) 24 hr tablet 25 mg  25 mg Oral See admin instructions Adefeso, Oladapo, DO      . pantoprazole (PROTONIX) EC tablet 40 mg  40 mg Oral Daily Adefeso, Oladapo, DO   40 mg at 02/08/20 0815  . pravastatin (PRAVACHOL) tablet 20 mg  20 mg Oral q AM Adefeso, Oladapo, DO      . rifaximin (XIFAXAN) tablet  550 mg  550 mg Oral BID Adefeso, Oladapo, DO   550 mg at 02/08/20 0815  . tamsulosin (FLOMAX) capsule 0.4 mg  0.4 mg Oral QPM Adefeso, Oladapo, DO       Current Outpatient Medications  Medication Sig Dispense Refill  . acetaminophen (TYLENOL) 500 MG tablet Take 500 mg by mouth every 6 (six) hours as needed for mild pain.    . Alcohol Swabs (ALCOHOL PREP) 70 % PADS Apply 1 each topically 3 (three) times daily.    . Ascorbic Acid (VITAMIN C) 1000 MG tablet Take 1,000 mg by mouth 2 (two) times daily.    . cholestyramine (QUESTRAN) 4 GM/DOSE powder Take by mouth.    . furosemide (LASIX) 40 MG tablet Take 40 mg by mouth 2 (two) times daily.    [name] gabapentin (NEURONTIN) 100 MG capsule TAKE 1 CAPSULE BY MOUTH AT BEDTIME 90 capsule 0  . HUMALOG KWIKPEN 100 UNIT/ML KwikPen Inject 50 Units into the skin as needed. Sliding scale    . HYDROcodone-acetaminophen (NORCO/VICODIN) 5-325 MG tablet Take 1 tablet by mouth 2 (two) times daily as needed.    . hydrOXYzine (VISTARIL) 25 MG capsule Take 25 mg by mouth 3 (three) times daily as needed.    . insulin degludec (TRESIBA FLEXTOUCH) 200 UNIT/ML FlexTouch Pen Inject 30 Units into the skin at bedtime. When blood sugars are up (Patient taking differently: Inject 120 Units into the skin at bedtime. When blood sugars are up)    . iron polysaccharides (NIFEREX) 150 MG capsule Take 150 mg by mouth at bedtime.     [name] lactulose (CHRONULAC) 10 GM/15ML solution Take 45 mLs (30 g total) by mouth 3 (three) times daily. 236 mL 1  . metoprolol succinate (TOPROL-XL) 25 MG 24 hr tablet Take 1 tablet (25 mg total) by mouth daily. Take at bedtime  on Monday ,Wednesday and Friday due to dialaylisis (Patient taking differently: Take 25 mg by mouth See admin instructions. Take 1 tablet twice a day, but take ONLY at bedtime on Monday ,Wednesday and Friday due to dialysis.)    . milk thistle 175 MG tablet Take 175 mg by mouth 3 (three) times daily.     . mirtazapine (REMERON) 15 MG tablet  Take 15 mg by mouth at bedtime.    . Multiple Vitamin (MULTIVITAMIN WITH MINERALS) TABS tablet Take 1 tablet by mouth in the morning.     [name] NOVOFINE 32G X 6 MM MISC Inject into the skin.    [name] nystatin (MYCOSTATIN/NYSTOP) powder Apply topically 3 (three) times daily. Into groin area and affected area 45 g 0  . omega-3 acid ethyl esters (LOVAZA) 1 g capsule Take 2 g by mouth 2 (two) times daily.     [name] omeprazole (PRILOSEC) 40 MG capsule Take 40 mg by mouth in the morning.     . pravastatin (PRAVACHOL) 20 MG tablet Take 20 mg by mouth in the morning.     . Probiotic Product (PROBIOTIC DAILY PO) Take 1 tablet by mouth in the morning.     . rifaximin (XIFAXAN) 550 MG TABS tablet Take 1 tablet (550 mg total) by mouth 2 (two) times daily. 60 tablet 11  . spironolactone (ALDACTONE) 50 MG tablet Take 50 mg by mouth in  the morning.     . tamsulosin (FLOMAX) 0.4 MG CAPS capsule Take 0.4 mg by mouth every evening.    . thiamine 100 MG tablet Take 100 mg by mouth in the morning. Vitamin B-1    . tiZANidine (ZANAFLEX) 2 MG tablet Take 2 mg by mouth 2 (two) times daily.    [name] triamcinolone cream (KENALOG) 0.1 % Apply 1 application topically 2 (two) times daily as needed (dry skin Itching).   3  . venlafaxine (EFFEXOR) 75 MG tablet Take 75 mg by mouth 3 (three) times daily with meals.     . vitamin B-12 (CYANOCOBALAMIN) 1000 MCG tablet Take 1,000 mcg by mouth daily.       ALLERGIES Chlorhexidine and Tape  MEDICAL HISTORY Past Medical History:  Diagnosis Date  . A-fib [facility])    when initially stating dialysis january 2021 at [facility] per spouse; never heard anything else about it  . Alcoholic cirrhosis ([place])    patient reports completing hep a and b vaccines in 2006  . Anemia    has had 3 units of prbcs 2013  . Anxiety   . Arthritis   . Asymptomatic gallstones    Ultrasound in 2006  . C. difficile colitis   . Cholelithiasis   . Chronic kidney disease    Dialysis M/W/F/Sa  . Depression    . Diabetes ([place])   . Duodenal ulcer with hemorrhage    per patient in 2006 or 2007, records have been requested  . Dyspnea    low oxygen level -  has O2 2 L all day  . 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 GERD (gastroesophageal reflux disease)   . Heart burn   . 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 HOH (hard of hearing)   . Hypertension   . Liver cirrhosis ([place])   . Splenomegaly    Ultrasound in 2006     SOCIAL HISTORY Social History   Socioeconomic History  . Marital status: Married    Spouse name: Not on file  . Number of children: 2  . Years of education: Not on file  . Highest education level: Not on file  Occupational History  . Occupation: RETIRED    Employer: SELF EMPLOYED  Tobacco Use  . Smoking status: Former Smoker    Packs/day: 2.00    Years: 25.00    Pack years: 50.00    Types: Cigarettes    Quit date: 01/28/1986    Years since quitting: 34.0  . Smokeless tobacco: Never Used  Vaping Use  . Vaping Use: Never used  Substance and Sexual Activity  . Alcohol use: No    Comment: quit in 05/2013  . Drug use: No  . Sexual activity: Not Currently  Other Topics Concern  . Not on file  Social History Narrative   Two step children from second marriage (divorced) who live with him along with some grandchildren.    Social Determinants of Health   Financial Resource Strain:   . Difficulty of Paying Living Expenses: Not on file  Food Insecurity: No Food Insecurity  . Worried About Charity fundraiser in the Last Year: Never true  . Ran Out of Food in the Last Year: Never true  Transportation Needs: No Transportation Needs  . Lack of Transportation (Medical): No  . Lack of Transportation (Non-Medical): No  Physical Activity:   . Days of Exercise per Week: Not on file  .  Minutes of Exercise per Session: Not on file  Stress:   . Feeling of Stress : Not on file  Social Connections:   . Frequency of Communication with Friends and Family: Not on file   . Frequency of Social Gatherings with Friends and Family: Not on file  . Attends Religious Services: Not on file  . Active Member of Clubs or Organizations: Not on file  . Attends Archivist Meetings: Not on file  . Marital Status: Not on file  Intimate Partner Violence:   . Fear of Current or Ex-Partner: Not on file  . Emotionally Abused: Not on file  . Physically Abused: Not on file  . Sexually Abused: Not on file     FAMILY HISTORY Family History  Problem Relation Age of Onset  . Colon cancer Father 33       deceased age 47  . Breast cancer Sister        deceased  . Diabetes Sister   . Stroke Mother   . Healthy Son   . Healthy Daughter   . Lung cancer Neg Hx   . Ovarian cancer Neg Hx      Review of Systems: Unobtainable secondary to patient's clinical status.  Physical Exam: Vitals:   02/08/20 0815 02/08/20 0830  BP:  (!) 142/69  Pulse: 98 99  Resp: 18 (!) 22  Temp:    SpO2: 98% 96%   No intake/output data recorded.  Intake/Output Summary (Last 24 hours) at 02/08/2020 0837 Last data filed at 02/08/2020 0256 Gross per 24 hour  Intake --  Output 900 ml  Net -900 ml   General: chronically ill appearing, nad HEENT: icteric sclera, MMM CV: normal rate Lungs: bilateral chest rise, normal wobl, diminished air entry bibasilar Abd: soft, slightly tender to palpation, distended Skin: ecchymosis over avf. jaundiced MSK: lue avf with +b/t, 2+ pitting edema bilateral lower extremities, right IJ tunneled dialysis catheter clear/dry/intact Neuro: awake, alert and oriented, speech clear and coherent, following commands  Test Results Reviewed Lab Results  Component Value Date   NA 121 (L) 02/08/2020   K 4.3 02/08/2020   CL 84 (L) 02/08/2020   CO2 21 (L) 02/08/2020   BUN 47 (H) 02/08/2020   CREATININE 4.61 (H) 02/08/2020   CALCIUM 8.7 (L) 02/08/2020   ALBUMIN 2.6 (L) 02/08/2020   PHOS 3.4 01/06/2020    I have reviewed relevant outside healthcare  records

## 2020-02-08 NOTE — ED Notes (Signed)
Back in room

## 2020-02-08 NOTE — ED Notes (Signed)
Patient states that he is not in any pain as long as he is laying still.

## 2020-02-08 NOTE — ED Notes (Signed)
Transported to ultrasound

## 2020-02-08 NOTE — Procedures (Signed)
     HEMODIALYSIS TREATMENT NOTE:   4 hour heparin-free HD completed via RIJ TDC.  Exit site is unremarkable. Goal NOT met:  UF was limited by hypotension and tachycardia (asymptomatic).   No changes from pre-dialysis assessment.  Net UF 2.5 L   Rockwell Alexandria, RN

## 2020-02-08 NOTE — ED Provider Notes (Addendum)
[facility] EMERGENCY DEPARTMENT Provider Note   CSN: [id number] Arrival date & time: 02/07/20  1823     History Chief Complaint  Patient presents with  . Back Pain    confusion    Jeffrey [name] is a 74 y.o. male.  Patient presents to the emergency department for evaluation of abdominal pain and back pain.  He has been slightly more confused than usual.  Patient reports that he missed his last 2 dialysis sessions because he has not been feeling well.  He has had some nausea and vomiting.        Past Medical History:  Diagnosis Date  . A-fib [facility])    when initially stating dialysis january 2021 at [facility] per spouse; never heard anything else about it  . Alcoholic cirrhosis ([place])    patient reports completing hep a and b vaccines in 2006  . Anemia    has had 3 units of prbcs 2013  . Anxiety   . Arthritis   . Asymptomatic gallstones    Ultrasound in 2006  . C. difficile colitis   . Cholelithiasis   . Chronic kidney disease    Dialysis M/W/F/Sa  . Depression   . Diabetes ([place])   . Duodenal ulcer with hemorrhage    per patient in 2006 or 2007, records have been requested  . Dyspnea    low oxygen level -  has O2 2 L all day  . 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 GERD (gastroesophageal reflux disease)   . Heart burn   . 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 HOH (hard of hearing)   . Hypertension   . Liver cirrhosis ([place])   . Splenomegaly    Ultrasound in 2006    Patient Active Problem List   Diagnosis Date Noted  . Status post thoracentesis   . Hepatic encephalopathy ([place]) 01/01/2020  . Pleural effusion 12/31/2019  . Hypoglycemia 12/31/2019  . Hyperammonemia ([place]) 12/31/2019  . Obesity (BMI 30-39.9) 12/31/2019  . Hypoalbuminemia 12/31/2019  . Nosebleed   . Goals of care, counseling/discussion   . Palliative care by specialist   . Acute hepatic encephalopathy 12/13/2019  . Encephalopathy acute   . Hyperkalemia   . Hyponatremia   .  Anemia 11/16/2019  . Acute blood loss anemia 09/20/2019  . Gastrointestinal hemorrhage   . Acute on chronic anemia 09/18/2019  . Generalized weakness 09/18/2019  . Type 2 diabetes mellitus ([place]) 09/18/2019  . 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 Enteritis due to Clostridium difficile 08/31/2019  . Edema 04/11/2019  . Alcoholic cirrhosis ([place]) 78/93/8101  . Type 2 diabetes mellitus with stage 3 chronic kidney disease, with long-term current use of insulin ([place]) 10/20/2017  . Essential hypertension, benign 10/20/2017  . Morbid obesity ([place]) 10/20/2017  . Mixed hyperlipidemia 10/20/2017  . Ascites   . SBP (spontaneous bacterial peritonitis) ([place]) 01/12/2016  . Shigella dysenteriae 01/11/2016  . Diarrhea 01/10/2016  . Cirrhosis ([place]) 09/05/2012  . Alcoholic cirrhosis of liver with ascites ([place]) 03/09/2012  . Heme positive stool 03/09/2012  . Anemia due to chronic blood loss 03/09/2012  . Pancytopenia ([place]) 03/09/2012  . FH: colon cancer 03/09/2012  . Esophageal varices in alcoholic cirrhosis ([place]) 75/03/2584    Past Surgical History:  Procedure Laterality Date  . AGILE CAPSULE N/A 09/20/2019   Procedure: AGILE CAPSULE;  Surgeon: Daneil Dolin, MD;  [facility];  Service: Endoscopy;  Laterality: N/A;  . APPENDECTOMY    . AV FISTULA PLACEMENT Left 08/17/2019   Procedure: ARTERIOVENOUS (AV) FISTULA CREATION VERSES ARTERIOVENOUS GRAFT;  Surgeon: [name], MD;  Location: [place];  Service: Vascular;  Laterality: Left;  . BASCILIC VEIN TRANSPOSITION Left 09/28/2019   Procedure: LEFT SECOND STAGE BASCILIC VEIN TRANSPOSITION;  Surgeon: [name], MD;  Location: [place];  Service: Vascular;  Laterality: Left;  . BIOPSY  02/03/2018   Procedure: BIOPSY;  Surgeon: Daneil Dolin, MD;  [facility];  Service: Endoscopy;;  bx of gastric polyps  . BIOPSY  09/19/2019   Procedure: BIOPSY;  Surgeon: Danie Binder, MD;  [facility];  Service:  Endoscopy;;  . CATARACT EXTRACTION Right   . COLONOSCOPY  03/10/2005   Rectal polyp as described above, removed with snare. Left sided  diverticula. The remainder of the colonic mucosa appeared normal. Inflammed polyp on path.  . COLONOSCOPY  04/1999   Dr. Thea Silversmith polyps removed,  Path showed hyperplastic  . COLONOSCOPY  10/2015   Dr. Britta Mccreedy: diverticulosis, single sessile tubular adenoma 3-79mm in size removed from descending colon.   . COLONOSCOPY WITH ESOPHAGOGASTRODUODENOSCOPY (EGD)  03/31/2012   GYF:VCBSWHQ AVMs. Colonic diverticulosis. tubular adenoma colon  . COLONOSCOPY WITH ESOPHAGOGASTRODUODENOSCOPY (EGD)  06/2019   FORSYTH: small esophageal varices without high risk stigmata, single large AVM on lesser curvature in gastric body s/p APC ablation, gastric antral and duodenal bulb polyposis. No significant source to explain transfusion dependent anemia. Colonoscopy with portal colopathy and diffuse edema, changes of Cdiff colitis on colonoscopy  . ESOPHAGEAL BANDING N/A 02/03/2018   Procedure: ESOPHAGEAL BANDING;  Surgeon: Daneil Dolin, MD;  [facility];  Service: Endoscopy;  Laterality: N/A;  . ESOPHAGOGASTRODUODENOSCOPY  01/15/2005   Three columns grade 1 to 2 esophageal varices, otherwise normal esophageal mucosa.  Esophagus was not manipulated otherwise./Nodularity of the antrum with overlying erosions, nonspecific finding. Path showed rare H.pylori  . ESOPHAGOGASTRODUODENOSCOPY  09/2008   Dr. Gaylene Brooks columns of grade 2-3 esoph varices, only one column was prominent. Portal gastropathy, multiple gastrc polyps at antrum, two were 2cm with black eschar, bulbar polyps, bulbar erosions  . ESOPHAGOGASTRODUODENOSCOPY  11/2004   Dr. Brantley Stage 3 esoph varices  . ESOPHAGOGASTRODUODENOSCOPY  03/31/2012   RMR: 4 columns(3-GR 2, 1-GR1) non-bleeding esophageal varices, portal gastropathy, small HH, early GAVE, multiple gastric polyps   . ESOPHAGOGASTRODUODENOSCOPY  (EGD) WITH PROPOFOL N/A 02/03/2018   Dr. Gala Romney: Esophageal varices, 3 columns grade 1-2.  Portal hypertensive gastropathy.  Multiple gastric polyps, biopsy consistent with hyperplastic.  [name] ESOPHAGOGASTRODUODENOSCOPY (EGD) WITH PROPOFOL N/A 09/19/2019   Fields: grade I and II esophageal varcies, mild portal hypertensive gastropathy, moderate gastritis but no H. pylori, single hyperplastic gastric polyp removed, obvious source for melena/transfusion dependent anemia not identified, may be due to friable gastric and duodenal mucosa in the setting of portal hypertension  . GASTRIC VARICES BANDING  03/31/2012   Procedure: GASTRIC VARICES BANDING;  Surgeon: Daneil Dolin, MD;  [facility];  Service: Endoscopy;  Laterality: N/A;  . GIVENS CAPSULE STUDY N/A 09/21/2019   Poor study with a lot of debris obstructing much of the view of the first approximate 3 hours out of 6 hours.  No obvious source of bleeding identified.  Sequela of bleeding and old blood seen in the form of"whisps" of blood, blood flecks mostly toward the end of the study.  [name] POLYPECTOMY  09/19/2019   Procedure: POLYPECTOMY;  Surgeon: Oneida Alar,  Marga Melnick, MD;  [facility];  Service: Endoscopy;;  . UMBILICAL HERNIA REPAIR  2017   exploratory laparotomy, abdominal washout       Family History  Problem Relation Age of Onset  . Colon cancer Father 37       deceased age 34  . Breast cancer Sister        deceased  . Diabetes Sister   . Stroke Mother   . Healthy Son   . Healthy Daughter   . Lung cancer Neg Hx   . Ovarian cancer Neg Hx     Social History   Tobacco Use  . Smoking status: Former Smoker    Packs/day: 2.00    Years: 25.00    Pack years: 50.00    Types: Cigarettes    Quit date: 01/28/1986    Years since quitting: 34.0  . Smokeless tobacco: Never Used  Vaping Use  . Vaping Use: Never used  Substance Use Topics  . Alcohol use: No    Comment: quit in 05/2013  . Drug use: No    Home  Medications Prior to Admission medications   Medication Sig Start Date End Date Taking? Authorizing Provider  acetaminophen (TYLENOL) 500 MG tablet Take 500 mg by mouth every 6 (six) hours as needed for mild pain.    [provider]  Alcohol Swabs (ALCOHOL PREP) 70 % PADS Apply 1 each topically 3 (three) times daily. 01/31/20   [provider]  Ascorbic Acid (VITAMIN C) 1000 MG tablet Take 1,000 mg by mouth 2 (two) times daily.    [provider]  cholestyramine Lucrezia Starch) 4 GM/DOSE powder Take by mouth. 01/31/20   [provider]  furosemide (LASIX) 40 MG tablet Take 40 mg by mouth 2 (two) times daily. 12/29/19   [provider]  gabapentin (NEURONTIN) 100 MG capsule TAKE 1 CAPSULE BY MOUTH AT BEDTIME 01/30/20   Waynetta Sandy, MD  HUMALOG KWIKPEN 100 UNIT/ML KwikPen Inject 50 Units into the skin as needed. Sliding scale 11/05/19   [provider]  HYDROcodone-acetaminophen (NORCO/VICODIN) 5-325 MG tablet Take 1 tablet by mouth 2 (two) times daily as needed. 01/16/20   [provider]  hydrOXYzine (VISTARIL) 25 MG capsule Take 25 mg by mouth 3 (three) times daily as needed. 01/23/20   [provider]  insulin degludec (TRESIBA FLEXTOUCH) 200 UNIT/ML FlexTouch Pen Inject 30 Units into the skin at bedtime. When blood sugars are up Patient taking differently: Inject 120 Units into the skin at bedtime. When blood sugars are up 01/06/20   Barton Dubois, MD  iron polysaccharides (NIFEREX) 150 MG capsule Take 150 mg by mouth at bedtime.     [provider]  lactulose (CHRONULAC) 10 GM/15ML solution Take 45 mLs (30 g total) by mouth 3 (three) times daily. 01/06/20   Barton Dubois, MD  metoprolol succinate (TOPROL-XL) 25 MG 24 hr tablet Take 1 tablet (25 mg total) by mouth daily. Take at bedtime  on Monday ,Wednesday and Friday due to dialaylisis Patient taking differently: Take 25 mg by mouth See admin instructions. Take 1  tablet twice a day, but take ONLY at bedtime on Monday ,Wednesday and Friday due to dialysis. 01/06/20   Barton Dubois, MD  milk thistle 175 MG tablet Take 175 mg by mouth 3 (three) times daily.     [provider]  mirtazapine (REMERON) 15 MG tablet Take 15 mg by mouth at bedtime. 01/31/20   [provider]  Multiple  Vitamin (MULTIVITAMIN WITH MINERALS) TABS tablet Take 1 tablet by mouth in the morning.     [provider]  NOVOFINE 32G X 6 MM MISC Inject into the skin. 01/31/20   [provider]  nystatin (MYCOSTATIN/NYSTOP) powder Apply topically 3 (three) times daily. Into groin area and affected area 01/06/20   Barton Dubois, MD  omega-3 acid ethyl esters (LOVAZA) 1 g capsule Take 2 g by mouth 2 (two) times daily.  04/07/19   [provider]  omeprazole (PRILOSEC) 40 MG capsule Take 40 mg by mouth in the morning.     [provider]  pravastatin (PRAVACHOL) 20 MG tablet Take 20 mg by mouth in the morning.     [provider]  Probiotic Product (PROBIOTIC DAILY PO) Take 1 tablet by mouth in the morning.     [provider]  rifaximin (XIFAXAN) 550 MG TABS tablet Take 1 tablet (550 mg total) by mouth 2 (two) times daily. 01/13/19   Mahala Menghini, PA-C  spironolactone (ALDACTONE) 50 MG tablet Take 50 mg by mouth in the morning.     [provider]  tamsulosin (FLOMAX) 0.4 MG CAPS capsule Take 0.4 mg by mouth every evening.    [provider]  thiamine 100 MG tablet Take 100 mg by mouth in the morning. Vitamin B-1    [provider]  tiZANidine (ZANAFLEX) 2 MG tablet Take 2 mg by mouth 2 (two) times daily. 01/31/20   [provider]  triamcinolone cream (KENALOG) 0.1 % Apply 1 application topically 2 (two) times daily as needed (dry skin Itching).  12/30/17   [provider]  venlafaxine (EFFEXOR) 75 MG tablet Take 75 mg by mouth 3 (three) times daily with meals.     [provider]   vitamin B-12 (CYANOCOBALAMIN) 1000 MCG tablet Take 1,000 mcg by mouth daily.    [provider]    Allergies    Chlorhexidine and Tape  Review of Systems   Review of Systems  Gastrointestinal: Positive for abdominal distention, abdominal pain, nausea and vomiting.  Psychiatric/Behavioral: Positive for confusion.  All other systems reviewed and are negative.   Physical Exam Updated Vital Signs BP (!) 144/69   Pulse 90   Temp 97.7 F (36.5 C) (Oral)   Resp 20   SpO2 92%   Physical Exam Vitals and nursing note reviewed.  Constitutional:      General: He is not in acute distress.    Appearance: Normal appearance. He is well-developed.  HENT:     Head: Normocephalic and atraumatic.     Right Ear: Hearing normal.     Left Ear: Hearing normal.     Nose: Nose normal.  Eyes:     Conjunctiva/sclera: Conjunctivae normal.     Pupils: Pupils are equal, round, and reactive to light.  Cardiovascular:     Rate and Rhythm: Regular rhythm.     Heart sounds: S1 normal and S2 normal. No murmur heard.  No friction rub. No gallop.   Pulmonary:     Effort: Pulmonary effort is normal. No respiratory distress.     Breath sounds: Normal breath sounds.  Chest:     Chest wall: No tenderness.  Abdominal:     General: Bowel sounds are normal. There is distension.     Palpations: Abdomen is soft.     Tenderness: There is abdominal tenderness. There is no guarding or rebound. Negative signs include Murphy's sign and McBurney's sign.  Hernia: No hernia is present.  Musculoskeletal:        General: Normal range of motion.     Cervical back: Normal range of motion and neck supple.  Skin:    General: Skin is warm and dry.     Findings: No rash.  Neurological:     Mental Status: He is alert and oriented to person, place, and time.     GCS: GCS eye subscore is 4. GCS verbal subscore is 5. GCS motor subscore is 6.     Cranial Nerves: No cranial nerve deficit.     Sensory: No sensory  deficit.     Coordination: Coordination normal.  Psychiatric:        Speech: Speech normal.        Behavior: Behavior normal.        Thought Content: Thought content normal.     ED Results / Procedures / Treatments   Labs (all labs ordered are listed, but only abnormal results are displayed) Labs Reviewed  CBC WITH DIFFERENTIAL/PLATELET - Abnormal; Notable for the following components:      Result Value   RBC 3.00 (*)    Hemoglobin 8.9 (*)    HCT 28.0 (*)    RDW 17.1 (*)    Lymphs Abs 0.3 (*)    All other components within normal limits  COMPREHENSIVE METABOLIC PANEL - Abnormal; Notable for the following components:   Sodium 120 (*)    Chloride 83 (*)    CO2 20 (*)    BUN 45 (*)    Creatinine, Ser 4.53 (*)    Albumin 3.0 (*)    Alkaline Phosphatase 233 (*)    Total Bilirubin 1.3 (*)    GFR calc non Af Amer 12 (*)    GFR calc Af Amer 14 (*)    Anion gap 17 (*)    All other components within normal limits  SARS CORONAVIRUS 2 BY RT PCR (HOSPITAL ORDER, [place] [facility])  AMMONIA    EKG None  ED ECG REPORT   Date: 02/08/2020  Rate: 97  Rhythm: normal sinus rhythm  QRS Axis: normal  Intervals: normal  ST/T Wave abnormalities: nonspecific T wave changes  Conduction Disutrbances:none  Narrative Interpretation:   Old EKG Reviewed: unchanged  I have personally reviewed the EKG tracing and agree with the computerized printout as noted.  Radiology CT ABDOMEN PELVIS WO CONTRAST  Result Date: 02/08/2020 CLINICAL DATA:  Abdominal pain, distention EXAM: CT ABDOMEN AND PELVIS WITHOUT CONTRAST TECHNIQUE: Multidetector CT imaging of the abdomen and pelvis was performed following the standard protocol without IV contrast. COMPARISON:  01/01/2020 FINDINGS: Lower chest: Small bilateral pleural effusions. Left base atelectasis. Coronary artery calcifications. Heart is normal size. Hepatobiliary: Changes of cirrhosis with nodular contours. Multiple gallstones  within the gallbladder. No focal hepatic abnormality or biliary ductal dilatation. Pancreas: No focal abnormality or ductal dilatation. Spleen: Splenomegaly with a craniocaudal length of 17.3 cm. No focal abnormality. Adrenals/Urinary Tract: No adrenal abnormality. No focal renal abnormality. No stones or hydronephrosis. Urinary bladder is unremarkable. Foley catheter present in the bladder. Stomach/Bowel: None Stomach, large and small bowel grossly unremarkable. Few scattered left colonic diverticula. Vascular/Lymphatic: Heavily calcified aorta and iliac vessels. No evidence of aneurysm or adenopathy. Reproductive: No visible focal abnormality. Other: Moderate ascites in the abdomen and pelvis. Musculoskeletal: No acute bony abnormality. Degenerative disc and facet disease throughout the lumbar spine. IMPRESSION: Changes of cirrhosis with associated splenomegaly and moderate ascites. Cholelithiasis. Aortic atherosclerosis.  Coronary artery disease. Small bilateral pleural effusions.  Left base atelectasis. Electronically Signed   By: Rolm Baptise M.D.   On: 02/08/2020 02:00   DG Chest Port 1 View  Result Date: 02/08/2020 CLINICAL DATA:  Shortness of breath EXAM: PORTABLE CHEST 1 VIEW COMPARISON:  01/19/2020 FINDINGS: Right dialysis catheter remains in place, unchanged. Cardiomegaly, vascular congestion. No confluent opacities or effusions. No acute bony abnormality. IMPRESSION: Cardiomegaly, vascular congestion. Electronically Signed   By: Rolm Baptise M.D.   On: 02/08/2020 00:52    Procedures Procedures (including critical care time)  Medications Ordered in ED Medications  cefTRIAXone (ROCEPHIN) 1 g in sodium chloride 0.9 % 100 mL IVPB (has no administration in time range)    ED Course  I have reviewed the triage vital signs and the nursing notes.  Pertinent labs & imaging results that were available during my care of the patient were reviewed by me and considered in my medical decision making  (see chart for details).    MDM Rules/Calculators/A&P                          Patient presents with multiple complaints.  His primary complaint is abdominal distention with abdominal pain that does radiate into the back.  Wife reports that he has been more confused than usual but he is awake, alert and oriented during my exam.  No neurologic findings.  Patient does have distention with mild tenderness but no signs of peritonitis.  He reports that he is short of breath but does not appear short of breath or hypoxic.  Chest x-ray does not show any volume overload or pneumonia.  Abdominal CT shows ascites which is likely the cause of his distention and discomfort including shortness of breath.  Doubt SBP but has to be considered.  Can cover with Rocephin.  Will require paracentesis for further diagnostic but mainly for therapeutic benefit.  Patient with hyponatremia.  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  He has missed multiple dialysis sessions.  Confusion might be secondary to uremia.  He has no confusion during my exam, however.  Patient not hyperkalemic.  Patient with multiple problems, will be difficult to manage outpatient.  Will need dialysis, paracentesis, will therefore admit for further management.  Final Clinical Impression(s) / ED Diagnoses Final diagnoses:  Hyponatremia  Ascites due to alcoholic cirrhosis ([place])  Uremia    Rx / DC Orders ED Discharge Orders    None       Idalie Canto, Gwenyth Allegra, MD 02/08/20 0310    Orpah Greek, MD 02/08/20 [phone]

## 2020-02-09 DIAGNOSIS — Z66 Do not resuscitate: Secondary | ICD-10-CM

## 2020-02-09 DIAGNOSIS — R509 Fever, unspecified: Secondary | ICD-10-CM | POA: Diagnosis present

## 2020-02-09 DIAGNOSIS — Z7189 Other specified counseling: Secondary | ICD-10-CM

## 2020-02-09 LAB — CBC
HCT: 25.6 % — ABNORMAL LOW (ref 39.0–52.0)
Hemoglobin: 8 g/dL — ABNORMAL LOW (ref 13.0–17.0)
MCH: 29.2 pg (ref 26.0–34.0)
MCHC: 31.3 g/dL (ref 30.0–36.0)
MCV: 93.4 fL (ref 80.0–100.0)
Platelets: 132 10*3/uL — ABNORMAL LOW (ref 150–400)
RBC: 2.74 MIL/uL — ABNORMAL LOW (ref 4.22–5.81)
RDW: 16.9 % — ABNORMAL HIGH (ref 11.5–15.5)
WBC: 4.5 10*3/uL (ref 4.0–10.5)
nRBC: 0 % (ref 0.0–0.2)

## 2020-02-09 LAB — IRON AND TIBC
Iron: 53 ug/dL (ref 45–182)
Saturation Ratios: 20 % (ref 17.9–39.5)
TIBC: 270 ug/dL (ref 250–450)
UIBC: 217 ug/dL

## 2020-02-09 LAB — RENAL FUNCTION PANEL
Albumin: 2.7 g/dL — ABNORMAL LOW (ref 3.5–5.0)
Anion gap: 14 (ref 5–15)
BUN: 31 mg/dL — ABNORMAL HIGH (ref 8–23)
CO2: 24 mmol/L (ref 22–32)
Calcium: 8.4 mg/dL — ABNORMAL LOW (ref 8.9–10.3)
Chloride: 85 mmol/L — ABNORMAL LOW (ref 98–111)
Creatinine, Ser: 3.24 mg/dL — ABNORMAL HIGH (ref 0.61–1.24)
GFR calc Af Amer: 21 mL/min — ABNORMAL LOW (ref >60)
GFR calc non Af Amer: 18 mL/min — ABNORMAL LOW (ref >60)
Glucose, Bld: 223 mg/dL — ABNORMAL HIGH (ref 70–99)
Phosphorus: 3.9 mg/dL (ref 2.5–4.6)
Potassium: 3.4 mmol/L — ABNORMAL LOW (ref 3.5–5.1)
Sodium: 123 mmol/L — ABNORMAL LOW (ref 135–145)

## 2020-02-09 LAB — GLUCOSE, CAPILLARY
Glucose-Capillary: 233 mg/dL — ABNORMAL HIGH (ref 70–99)
Glucose-Capillary: 178 mg/dL — ABNORMAL HIGH (ref 70–99)
Glucose-Capillary: 165 mg/dL — ABNORMAL HIGH (ref 70–99)
Glucose-Capillary: 217 mg/dL — ABNORMAL HIGH (ref 70–99)

## 2020-02-09 LAB — MAGNESIUM: Magnesium: 1.6 mg/dL — ABNORMAL LOW (ref 1.7–2.4)

## 2020-02-09 LAB — FERRITIN: Ferritin: 589 ng/mL — ABNORMAL HIGH (ref 24–336)

## 2020-02-09 MED ORDER — MIRTAZAPINE 15 MG PO TABS
30.0000 mg | ORAL_TABLET | Freq: Every day | ORAL | Status: DC
  Administered 2020-02-09: 30 mg via ORAL
  Filled 2020-02-09: qty 2

## 2020-02-09 MED ORDER — GABAPENTIN 100 MG PO CAPS
100.0000 mg | ORAL_CAPSULE | Freq: Every day | ORAL | Status: DC
  Administered 2020-02-09: 100 mg via ORAL
  Filled 2020-02-09: qty 1

## 2020-02-09 MED ORDER — VENLAFAXINE HCL 37.5 MG PO TABS
75.0000 mg | ORAL_TABLET | Freq: Three times a day (TID) | ORAL | Status: DC
  Administered 2020-02-09 – 2020-02-10 (×3): 75 mg via ORAL
  Filled 2020-02-09 (×3): qty 2

## 2020-02-09 MED ORDER — VANCOMYCIN HCL 1500 MG/300ML IV SOLN
1500.0000 mg | Freq: Once | INTRAVENOUS | Status: AC
  Administered 2020-02-09: 1500 mg via INTRAVENOUS
  Filled 2020-02-09: qty 300

## 2020-02-09 MED ORDER — DARBEPOETIN ALFA 60 MCG/0.3ML IJ SOSY
60.0000 ug | PREFILLED_SYRINGE | Freq: Once | INTRAMUSCULAR | Status: AC
  Administered 2020-02-09: 60 ug via INTRAVENOUS
  Filled 2020-02-09: qty 0.3

## 2020-02-09 NOTE — Progress Notes (Signed)
[facility] Progress Note    Assessment/ Plan:   1. Ascites and pleural effusions: evaluated by radiology, not enough for paracentesis or thora. Will maximize UF as tolerated 2. Fever: isolated event. infectious work up per primary service. Blood cultures, will need line culture. If blood cultures positive would also need an echo and his spine to be imaged to rule out seeding (back pain). Will utilize AVF. Is due to have his catheter removed as an outpatient, may need to do that while he's here but will wait for culture results 3. Tachyarrhythmia: back on metoprolol, cardizem added per primary service. Would caution with cardizem on HD day to maximize UF and to prevent hypotension. Currently rate controlled with reg rhythm on exam 4. Alcoholic cirrhosis: continue lasix and aldactone. Overall poor prognosis. Agree with re-visiting with palliative care 5. Hyponatremia (chronic): secondary to cirrhosis and hypervolemia. Improved. Will attempt to offload fluid on HD as tolerated, 131 Na bath. Overall, poor prognostic indicator 6. Volume/HTN: back on metoprolol for tachyarrthymia. Lasix and aldactone. Still makes urine. UF as tolerated. 7.  ESRD: HD today and then resume MWF thereafter.  Albumin prn. Will need his outpatient records. 8. Anemia of chronic disease, history of transfusion dependent anemia: was recently loaded with iron as an outpatient? Hgb mostly around his baseline, currently at 8.0 9. Secondary hyperparathyroidism, phos 3.9, corrected cal wnl, pth ordered 10. DM2, mgmt per primary service 11. Access: has both rij tdc and lue avf, will mainly utilize his avf while he's undergoing an infectious workup, will need catheter cultures (see above). tdc due to be removed as an outpatient, may need to be removed here but will await his infectious workup first 12. Non-adherence: nonadherent to dialysis due to back pain and inability to sit in dialysis recliner due to back pain hence  he comes here. Appreciate assistance from palliative care to further address symptom mgmt and re-address goals of care. Overall, has a poor prognosis from a cirrhosis standpoint  Need outpatient records, per previous admissions his outpatient orders were: [facility] MWF. 4hrs, edw 109kg, 2k, 2cal, 350/600, lue avf and tdc. Utilizing lue avf recently  Discussed with primary service.   [name], MD [facility] 02/09/2020, 8:33 AM    Subjective:   S/p HD yesterday, UF limited  By hypotension and tachycardia, concern for afib/flutter at first but then deemed to be sinus tach which improved. Net uf 2.5L. febrile last night with tmax 100.6.   Objective:   BP (!) 151/66 (BP Location: Right Arm)   Pulse 82   Temp 98.2 F (36.8 C)   Resp 18   Wt 101.6 kg   SpO2 94%   BMI 35.08 kg/m   Intake/Output Summary (Last 24 hours) at 02/09/2020 9242 Last data filed at 02/09/2020 0247 Gross per 24 hour  Intake 350 ml  Output 2864 ml  Net -2514 ml   Weight change:   Physical Exam: Gen:nad, laying flat in bed CVS:s1s2, rrr Resp:slightly diminished air entry bibasilar, no overt w/r/r/c, unlabored, bl chest expansion AST:MHDQQIWLN, soft, nontender Ext: 1+ edema bl le's, rij tdc c/d/i, lue avf with surrounding ecchymosis but with good bruit/thrill Neuro: speech clear and coherent, moves all ext spontaneously, alert and oriented  Imaging: CT ABDOMEN PELVIS WO CONTRAST  Result Date: 02/08/2020 CLINICAL DATA:  Abdominal pain, distention EXAM: CT ABDOMEN AND PELVIS WITHOUT CONTRAST TECHNIQUE: Multidetector CT imaging of the abdomen and pelvis was performed following the standard protocol without IV contrast. COMPARISON:  01/01/2020  FINDINGS: Lower chest: Small bilateral pleural effusions. Left base atelectasis. Coronary artery calcifications. Heart is normal size. Hepatobiliary: Changes of cirrhosis with nodular contours. Multiple gallstones within the gallbladder. No focal hepatic  abnormality or biliary ductal dilatation. Pancreas: No focal abnormality or ductal dilatation. Spleen: Splenomegaly with a craniocaudal length of 17.3 cm. No focal abnormality. Adrenals/Urinary Tract: No adrenal abnormality. No focal renal abnormality. No stones or hydronephrosis. Urinary bladder is unremarkable. Foley catheter present in the bladder. Stomach/Bowel: None Stomach, large and small bowel grossly unremarkable. Few scattered left colonic diverticula. Vascular/Lymphatic: Heavily calcified aorta and iliac vessels. No evidence of aneurysm or adenopathy. Reproductive: No visible focal abnormality. Other: Moderate ascites in the abdomen and pelvis. Musculoskeletal: No acute bony abnormality. Degenerative disc and facet disease throughout the lumbar spine. IMPRESSION: Changes of cirrhosis with associated splenomegaly and moderate ascites. Cholelithiasis. Aortic atherosclerosis.  Coronary artery disease. Small bilateral pleural effusions.  Left base atelectasis. Electronically Signed   By: Rolm Baptise M.D.   On: 02/08/2020 02:00   DG Chest Port 1 View  Result Date: 02/08/2020 CLINICAL DATA:  Shortness of breath EXAM: PORTABLE CHEST 1 VIEW COMPARISON:  01/19/2020 FINDINGS: Right dialysis catheter remains in place, unchanged. Cardiomegaly, vascular congestion. No confluent opacities or effusions. No acute bony abnormality. IMPRESSION: Cardiomegaly, vascular congestion. Electronically Signed   By: Rolm Baptise M.D.   On: 02/08/2020 00:52   [place] ASCITES (ABDOMEN LIMITED)  Result Date: 02/08/2020 CLINICAL DATA:  Alcoholic cirrhosis, ascites EXAM: LIMITED ABDOMEN ULTRASOUND FOR ASCITES TECHNIQUE: Limited ultrasound survey for ascites was performed in all four abdominal quadrants. COMPARISON:  CT abdomen and pelvis 02/08/2020 FINDINGS: Small pocket of ascites in the LEFT lower quadrant. Additional ascites is seen due extending deep into the pelvis, not accessible. No drainable ascites collection identified.  IMPRESSION: Insufficient ascites for paracentesis. Electronically Signed   By: Lavonia Dana M.D.   On: 02/08/2020 10:47    Labs: BMET Recent Labs  Lab 02/07/20 2143 02/08/20 0525 02/09/20 0642  NA 120* 121* 123*  K 4.5 4.3 3.4*  CL 83* 84* 85*  CO2 20* 21* 24  GLUCOSE 95 92 223*  BUN 45* 47* 31*  CREATININE 4.53* 4.61* 3.24*  CALCIUM 9.0 8.7* 8.4*  PHOS  --   --  3.9   CBC Recent Labs  Lab 02/07/20 2143 02/08/20 0525 02/09/20 0642  WBC 4.4 3.5* 4.5  NEUTROABS 3.6  --   --   HGB 8.9* 8.3* 8.0*  HCT 28.0* 25.8* 25.6*  MCV 93.3 92.8 93.4  PLT 155 136* 132*    Medications:    . diltiazem  30 mg Oral Q6H  . furosemide  40 mg Oral BID  . heparin  5,000 Units Subcutaneous Q8H  . insulin aspart  0-5 Units Subcutaneous QHS  . insulin aspart  0-6 Units Subcutaneous TID WC  . lactulose  30 g Oral TID  . metoprolol tartrate  50 mg Oral BID  . pantoprazole  40 mg Oral Daily  . pravastatin  20 mg Oral q AM  . rifaximin  550 mg Oral BID  . tamsulosin  0.4 mg Oral QPM

## 2020-02-09 NOTE — Progress Notes (Signed)
Consultation Note Date: 02/09/2020   Patient Name: Jeffrey [name]  DOB: [birth year]  MRN: [id number]  Age / Sex: 74 y.o., male  PCP: Monico Blitz, MD Referring Physician: Roxan Hockey, MD  Reason for Consultation: Establishing goals of care  HPI/Patient Profile: 74 y.o. male  with past medical history of ESRD on HD, ETOH cirrhosis (on rifaxmin and lactulose has recurrent ascites), CHF,  admitted on 02/07/2020 with back pain and missing dialysis appointments. Palliative medicine consulted for goals of care and symptom management.   Clinical Assessment and Goals of Care: Evaluated patient- he was lying in bed receiving dialysis. Met with patient, then also spoke with his spouse. No complaints of back pain at this time.  Jeffrey [name] lives at home with spouse. He is able to ambulate, requires assistance with bathing and sometimes needs help with cleaning after toileting.   He enjoys coming into the hospital when he misses his dialysis. He used to enjoy woodworking but hasn't been able to due to he can walk to his shed, but he cannot walk back up to his house from his shed.  We discussed his goals of care- he wishes to "stay alive" as long as possible. He does note some decreased quality of life and depression, however, he is not at a point where he would choose to stop dialysis and allow natural dying process. We discussed how if his goals are to continue aggressive medical care- we need to try and find ways for him to tolerate his outpatient dialysis sessions.  His main complaint with dialysis is that the chairs are very uncomfortable and aggravate his degenerative joint disease chronic low back pain. He takes hydrocodone and does get relief from the hydrocodone- however, by the time his dialysis session is done- it has worn off. He also expressed how much he enjoys the dialysis technician who treats him in the hospital. In a  later discussion with his wife- [name] notes that at the dialysis center he is very isolated and no one talks to him.  Advanced directives were discussed. He has yellow DNR form on chart, and DNR armband- however, his code status in computer is listed as full code. Jeffrey [name] tells me this is because he told the doctor he wants to keep living as long as possible. We discussed the difference between doing interventions to continue his quantity of lifetime vs resuscitation and CPR if he were to go into cardiac/respiratory arrest. He agrees to DNR order.  After meeting with Jeffrey [name] I called his wifeTamela Oddi for further discussion. Jeffrey [name] did note that providers usually talk with her more than him as he is forgetful.  [name] understands the necessity of Jeffrey [name] being about to do outpatient dialysis. She has told him very plainly that he needs to decide if he is going to go to dialysis or if he would prefer to stop and allow natural dying. We discussed that Jeffrey [name] appeared to enjoy coming into the hospital and [name] notes that he may be choosing to miss dialysis so that  he can go into the hospital. I also discussed with [name] and Jeffrey [name] that if he continues to not be able to tolerate outpatient dialysis- that his nephrologist my limit his options for continuing dialysis as it is often not offered to patients who cannot tolerate outpatient setting. [name] also shares that Jeffrey [name] frequently has difficulty sleeping and his appetite is decreased. We reviewed Jeffrey [name]'s home medication regimen to see if any adjustments could be made that would possibly help with his pain, depression, and increase tolerance of dialysis.   Primary Decision Maker PATIENT- and spouse    SUMMARY OF RECOMMENDATIONS -DNR -TOC referral for outpatient Palliative to see at home -Recommend taking his current pain medication (vicodin) prior to going to dialysis even if he is not having pain. I also recommend that he take a vicodin with him to the dialysis center and take it near the  end of his session- he may need an order from his nephrologist at the dialysis center to be allowed to take the medication there- or they could also provide it. -As far as adjustments to his current regimen- I have restarted his home Effexor which is maxed out at $Remo'75mg'OMQtd$  TID. He is on mirtazipine $RemoveBefore'15mg'AdYipysiLyTsU$  nightly- will increase this to $Remov'30mg'NkYNqb$  nightly. I also restarted his home gabapentin dose.  -Non pharmacological methods for helping with back pain were also encouraged including putting a pillow under his knees when sitting in dialysis recliner, and use of heat.  -He would also benefit if he could be moved in the dialysis center to somewhere he was less isolated and staff interacted with him more. -I am concerned that his preference and enjoyment for coming into the hospital will outweigh attempts to help him tolerate his dialysis   Code Status/Advance Care Planning:  DNR  Palliative Prophylaxis:   Frequent Pain Assessment  Additional Recommendations (Limitations, Scope, Preferences):  Full Scope Treatment  Prognosis:    Unable to determine  Discharge Planning: Home with Palliative Services  Primary Diagnoses: Present on Admission: . Alcoholic cirrhosis of liver with ascites ([place]) . Essential hypertension, benign . Mixed hyperlipidemia . 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[place]) . Hyponatremia . Back pain . Bilateral pleural effusion . Hypoalbuminemia . Fever and chills   I have reviewed the medical record, interviewed the patient and family, and examined the patient. The following aspects are pertinent.  Past Medical History:  Diagnosis Date  . A-fib [facility])    when initially stating dialysis january 2021 at [facility] per spouse; never heard anything else about it  . Alcoholic cirrhosis ([place])    patient reports completing hep a and b vaccines in 2006  . Anemia    has had 3 units of prbcs 2013  . Anxiety   . Arthritis   . Asymptomatic gallstones    Ultrasound in 2006  . C.  difficile colitis   . Cholelithiasis   . Chronic kidney disease    Dialysis M/W/F/Sa  . Depression   . Diabetes ([place])   . Duodenal ulcer with hemorrhage    per patient in 2006 or 2007, records have been requested  . Dyspnea    low oxygen level -  has O2 2 L all day  . 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 GERD (gastroesophageal reflux disease)   . Heart burn   . 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 HOH (hard of hearing)   . Hypertension   . Liver cirrhosis ([place])   .  Splenomegaly    Ultrasound in 2006   Social History   Socioeconomic History  . Marital status: Married    Spouse name: Not on file  . Number of children: 2  . Years of education: Not on file  . Highest education level: Not on file  Occupational History  . Occupation: RETIRED    Employer: SELF EMPLOYED  Tobacco Use  . Smoking status: Former Smoker    Packs/day: 2.00    Years: 25.00    Pack years: 50.00    Types: Cigarettes    Quit date: 01/28/1986    Years since quitting: 34.0  . Smokeless tobacco: Never Used  Vaping Use  . Vaping Use: Never used  Substance and Sexual Activity  . Alcohol use: No    Comment: quit in 05/2013  . Drug use: No  . Sexual activity: Not Currently  Other Topics Concern  . Not on file  Social History Narrative   Two step children from second marriage (divorced) who live with him along with some grandchildren.    Social Determinants of Health   Financial Resource Strain:   . Difficulty of Paying Living Expenses: Not on file  Food Insecurity: No Food Insecurity  . Worried About Charity fundraiser in the Last Year: Never true  . Ran Out of Food in the Last Year: Never true  Transportation Needs: No Transportation Needs  . Lack of Transportation (Medical): No  . Lack of Transportation (Non-Medical): No  Physical Activity:   . Days of Exercise per Week: Not on file  . Minutes of Exercise per Session: Not on file  Stress:   . Feeling of Stress : Not on file    Social Connections:   . Frequency of Communication with Friends and Family: Not on file  . Frequency of Social Gatherings with Friends and Family: Not on file  . Attends Religious Services: Not on file  . Active Member of Clubs or Organizations: Not on file  . Attends Archivist Meetings: Not on file  . Marital Status: Not on file   Family History  Problem Relation Age of Onset  . Colon cancer Father 16       deceased age 29  . Breast cancer Sister        deceased  . Diabetes Sister   . Stroke Mother   . Healthy Son   . Healthy Daughter   . Lung cancer Neg Hx   . Ovarian cancer Neg Hx    Scheduled Meds: . darbepoetin (ARANESP) injection - DIALYSIS  60 mcg Intravenous Once  . diltiazem  30 mg Oral Q6H  . furosemide  40 mg Oral BID  . heparin  5,000 Units Subcutaneous Q8H  . insulin aspart  0-5 Units Subcutaneous QHS  . insulin aspart  0-6 Units Subcutaneous TID WC  . lactulose  30 g Oral TID  . metoprolol tartrate  50 mg Oral BID  . pantoprazole  40 mg Oral Daily  . pravastatin  20 mg Oral q AM  . rifaximin  550 mg Oral BID  . tamsulosin  0.4 mg Oral QPM   Continuous Infusions: . sodium chloride    . sodium chloride    . vancomycin 1,500 mg (02/09/20 1315)   PRN Meds:.sodium chloride, sodium chloride, alteplase, heparin Medications Prior to Admission:  Prior to Admission medications   Medication Sig Start Date End Date Taking? Authorizing Provider  acetaminophen (TYLENOL) 500 MG tablet Take 500 mg by mouth every  6 (six) hours as needed for mild pain.   Yes [provider]  Alcohol Swabs (ALCOHOL PREP) 70 % PADS Apply 1 each topically 3 (three) times daily. 01/31/20  Yes [provider]  Ascorbic Acid (VITAMIN C) 1000 MG tablet Take 1,000 mg by mouth 2 (two) times daily.   Yes [provider]  cholestyramine Lucrezia Starch) 4 GM/DOSE powder Take 4 g by mouth daily.  01/31/20  Yes [provider]  furosemide (LASIX) 40 MG tablet Take  40 mg by mouth 2 (two) times daily. 12/29/19  Yes [provider]  gabapentin (NEURONTIN) 100 MG capsule TAKE 1 CAPSULE BY MOUTH AT BEDTIME 01/30/20  Yes Waynetta Sandy, MD  HUMALOG KWIKPEN 100 UNIT/ML KwikPen Inject 50 Units into the skin as needed. Sliding scale 11/05/19  Yes [provider]  HYDROcodone-acetaminophen (NORCO/VICODIN) 5-325 MG tablet Take 1 tablet by mouth 2 (two) times daily as needed. 01/16/20  Yes [provider]  hydrOXYzine (VISTARIL) 25 MG capsule Take 25 mg by mouth 3 (three) times daily as needed. 01/23/20  Yes [provider]  insulin degludec (TRESIBA FLEXTOUCH) 200 UNIT/ML FlexTouch Pen Inject 30 Units into the skin at bedtime. When blood sugars are up Patient taking differently: Inject 120 Units into the skin at bedtime. When blood sugars are up 01/06/20  Yes Barton Dubois, MD  iron polysaccharides (NIFEREX) 150 MG capsule Take 150 mg by mouth at bedtime.    Yes [provider]  lactulose (CHRONULAC) 10 GM/15ML solution Take 45 mLs (30 g total) by mouth 3 (three) times daily. 01/06/20  Yes Barton Dubois, MD  metoprolol succinate (TOPROL-XL) 25 MG 24 hr tablet Take 1 tablet (25 mg total) by mouth daily. Take at bedtime  on Monday ,Wednesday and Friday due to dialaylisis Patient taking differently: Take 25 mg by mouth See admin instructions. Take 1 tablet twice a day, but take ONLY at bedtime on Monday ,Wednesday and Friday due to dialysis. 01/06/20  Yes Barton Dubois, MD  milk thistle 175 MG tablet Take 175 mg by mouth 3 (three) times daily.    Yes [provider]  mirtazapine (REMERON) 15 MG tablet Take 15 mg by mouth at bedtime. 01/31/20  Yes [provider]  Multiple Vitamin (MULTIVITAMIN WITH MINERALS) TABS tablet Take 1 tablet by mouth in the morning.    Yes [provider]  NOVOFINE 32G X 6 MM MISC Inject into the skin. 01/31/20  Yes [provider]  nystatin (MYCOSTATIN/NYSTOP) powder  Apply topically 3 (three) times daily. Into groin area and affected area 01/06/20  Yes Barton Dubois, MD  omega-3 acid ethyl esters (LOVAZA) 1 g capsule Take 2 g by mouth 2 (two) times daily.  04/07/19  Yes [provider]  omeprazole (PRILOSEC) 40 MG capsule Take 40 mg by mouth in the morning.    Yes [provider]  pravastatin (PRAVACHOL) 20 MG tablet Take 20 mg by mouth in the morning.    Yes [provider]  Probiotic Product (PROBIOTIC DAILY PO) Take 1 tablet by mouth in the morning.    Yes [provider]  rifaximin (XIFAXAN) 550 MG TABS tablet Take 1 tablet (550 mg total) by mouth 2 (two) times daily. 01/13/19  Yes Mahala Menghini, PA-C  spironolactone (ALDACTONE) 50 MG tablet Take 50 mg by mouth in the morning.    Yes [provider]  tamsulosin (FLOMAX) 0.4 MG CAPS capsule Take 0.4 mg by mouth every evening.   Yes [provider]  thiamine 100 MG tablet Take 100 mg by mouth in the morning. Vitamin B-1   Yes [provider]  tiZANidine (ZANAFLEX) 2 MG tablet Take 2 mg by mouth 2 (two) times daily. 01/31/20  Yes [provider]  triamcinolone cream (KENALOG) 0.1 % Apply 1 application topically 2 (two) times daily as needed (dry skin Itching).  12/30/17  Yes [provider]  venlafaxine (EFFEXOR) 75 MG tablet Take 75 mg by mouth 3 (three) times daily with meals.    Yes [provider]  vitamin B-12 (CYANOCOBALAMIN) 1000 MCG tablet Take 1,000 mcg by mouth daily.   Yes [provider]   Allergies  Allergen Reactions  . Chlorhexidine   . Tape Itching and Rash   Review of Systems  Constitutional: Positive for appetite change.  Musculoskeletal: Positive for back pain.    Physical Exam  Vital Signs: BP 129/70   Pulse 77   Temp 98.3 F (36.8 C) (Oral)   Resp 18   Wt 102.2 kg   SpO2 94%   BMI 35.29 kg/m  Pain Scale: 0-10   Pain Score: 0-No pain   SpO2: SpO2: 94 % O2 Device:SpO2: 94  % O2 Flow Rate: .   IO: Intake/output summary:   Intake/Output Summary (Last 24 hours) at 02/09/2020 1424 Last data filed at 02/09/2020 1305 Gross per 24 hour  Intake 350 ml  Output 2865 ml  Net -2515 ml    LBM: Last BM Date: 02/09/19 Baseline Weight: Weight: 104.7 kg Most recent weight: Weight: 102.2 kg     Palliative Assessment/Data: PPS: 60%     Thank you for this consult. Palliative medicine will continue to follow and assist as needed.    Time Total: 86 minutes Greater than 50%  of this time was spent counseling and coordinating care related to the above assessment and plan.  Signed by: Mariana Kaufman, AGNP-C Palliative Medicine    Please contact Palliative Medicine Team phone at [phone] for questions and concerns.  For individual provider: See Shea Evans

## 2020-02-09 NOTE — Progress Notes (Signed)
   02/08/20 2312  Assess: MEWS Score  Temp (!) 100.6 F (38.1 C)  BP (!) 146/59  Pulse Rate (!) 101  Resp 18  SpO2 100 %  O2 Device Room Air  Assess: MEWS Score  MEWS Temp 1  MEWS Systolic 0  MEWS Pulse 1  MEWS RR 0  MEWS LOC 0  MEWS Score 2  MEWS Score Color Yellow  Assess: if the MEWS score is Yellow or Red  Were vital signs taken at a resting state? Yes  Focused Assessment No change from prior assessment  Early Detection of Sepsis Score *See Row Information* High  MEWS guidelines implemented *See Row Information* Yes  Notify: Charge Nurse/RN  Name of Charge Nurse/RN Notified  (I am charge nurse, discussed with fellow nurses)  Date Charge Nurse/RN Notified 02/08/20  Time Charge Nurse/RN Notified 2312

## 2020-02-09 NOTE — Progress Notes (Signed)
Patient Demographics:    Jeffrey [name], is a 74 y.o. male, DOB - 05/03/1946, XBD:[id number]  Admit date - 02/07/2020   Admitting Physician Bernadette Hoit, DO  Outpatient Primary MD for the patient is Monico Blitz, MD  LOS - 1   Chief Complaint  Patient presents with  . Back Pain    confusion        Subjective:    Jeffrey [name] today has no emesis,  No chest pain,   -- had fevers AND Chills overnight  Assessment  & Plan :    Active Problems:   Alcoholic cirrhosis of liver with ascites (HCC)   Essential hypertension, benign   Mixed hyperlipidemia   Type 2 diabetes mellitus (HCC)   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HCC)   Hyponatremia   Bilateral pleural effusion   Hypoalbuminemia   Back pain   Brief Summary:- 74 y.o.malewith medical history significant foralcoholic liver cirrhosis, ESRD(MWF), hypertension, diabetes mellitus type 2, hyperlipidemia, transfusion dependent anemia, portal hypertensive gastropathyandesophageal varices admitted on 02/08/2020 after admission back-to-back hemodialysis sessions   A/p 1)Alcoholic Liver Cirrhosis with Recurrent Ascites--- patient apparently longer drinks alcohol -Per radiologist not enough fluid to attempt paracentesis at this time - c/n  Aldactone and Lasix -Also continue rifaximin and lactulose  2)Ascites and bilateral Pleural effusions--- discussed with radiologist Dr. Thornton Papas, not enough fluid for thoracentesis or paracentesis  per imaging studies  3)ESRD --typically Mondays Wednesdays Fridays -Patient continues to miss hemodialysis session frequently because he does not like to sit in the HD chair as outpatient -He states his back feels better when he lays down, so comes to the hospital to get hemodialysis she can be laying down rather than sitting up in the outpatient hemodialysis unit -Discussed with nephrologist -Pt had serial/back-to-back  hemodialysis sessions on 02/08/2020 and 02/09/2020  4)Tachyarrhythmia--- patient flips between sinus tach on a flutter with variable conduction -PTA was on metoprolol, probably noncompliant, resume metoprolol, added Cardizem Recent echo shows EF preserved  5)DM2- Use Novolog/Humalog Sliding scale insulin with Accu-Cheks/Fingersticks as ordered   6)Chronic diastolic heart failure -Echodone on8/2/2021showed LVEFof70-75% -Volume overloaded, continue to use Lasix, Aldactone on hemodialysis to address volume status  7) chronic hyponatremia--- patient with history of underlying liver cirrhosis, no mental status changes at this time  8) febrile illness--- patient had fevers and chills--- patient has right IJ HD catheter that was used for HD on 02/08/2020, patient then spiked a fever  ---blood cultures obtained -Give IV Vanco post hemodialysis pending culture results  Disposition/Need for in-Hospital Stay- patient unable to be discharged at this time due to --febrile illness in a patient with hemodialysis catheter requiring IV vancomycin pending culture results*  Status is: Inpatient  Remains inpatient appropriate because:-febrile illness in a patient with hemodialysis catheter requiring IV vancomycin pending culture results*   Disposition: The patient is from: Home              Anticipated d/c is to: Home              Anticipated d/c date is: 2 days              Patient currently is not medically stable to d/c. Barriers: Not Clinically Stable- -febrile illness in a patient with hemodialysis catheter  requiring IV vancomycin pending culture results  Code Status : Full code  Family Communication:    (patient is alert, awake and coherent)   Consults  :  Nephrology  DVT Prophylaxis  :    - Heparin - SCDs    Lab Results  Component Value Date   PLT 132 (L) 02/09/2020    Inpatient Medications  Scheduled Meds: . darbepoetin (ARANESP) injection - DIALYSIS  60 mcg Intravenous Once    . diltiazem  30 mg Oral Q6H  . furosemide  40 mg Oral BID  . heparin  5,000 Units Subcutaneous Q8H  . insulin aspart  0-5 Units Subcutaneous QHS  . insulin aspart  0-6 Units Subcutaneous TID WC  . lactulose  30 g Oral TID  . metoprolol tartrate  50 mg Oral BID  . pantoprazole  40 mg Oral Daily  . pravastatin  20 mg Oral q AM  . rifaximin  550 mg Oral BID  . tamsulosin  0.4 mg Oral QPM   Continuous Infusions: . sodium chloride    . sodium chloride    . vancomycin 1,500 mg (02/09/20 1315)   PRN Meds:.sodium chloride, sodium chloride, alteplase, heparin    Anti-infectives (From admission, onward)   Start     Dose/Rate Route Frequency Ordered Stop   02/09/20 1200  vancomycin (VANCOREADY) IVPB 1500 mg/300 mL        1,500 mg 150 mL/hr over 120 Minutes Intravenous  Once 02/09/20 1006     02/08/20 1000  rifaximin (XIFAXAN) tablet 550 mg        550 mg Oral 2 times daily 02/08/20 0619     02/08/20 0345  cefTRIAXone (ROCEPHIN) 1 g in sodium chloride 0.9 % 100 mL IVPB        1 g 200 mL/hr over 30 Minutes Intravenous  Once 02/08/20 0340 02/08/20 0617        Objective:   Vitals:   02/09/20 1215 02/09/20 1245 02/09/20 1315 02/09/20 1345  BP: 138/89 (!) 143/68 117/65 (!) 146/67  Pulse: 69 79 78 79  Resp:      Temp:      TempSrc:      SpO2:      Weight:        Wt Readings from Last 3 Encounters:  02/09/20 102.2 kg  02/07/20 104.3 kg  02/01/20 104.6 kg     Intake/Output Summary (Last 24 hours) at 02/09/2020 1355 Last data filed at 02/09/2020 1305 Gross per 24 hour  Intake 350 ml  Output 2865 ml  Net -2515 ml   Physical Exam  Gen:- Awake Alert,  In no apparent distress  HEENT:- [place].AT, No sclera icterus Neck-Supple Neck,No JVD,.  Right IJ hemodialysis catheter Lungs-  CTAB , fair symmetrical air movement CV- S1, S2 normal, irregular Abd-  +ve B.Sounds, Abd Soft, No tenderness,    Extremity/Skin:- pedal pulses present , left upper extremity AV fistula with thrill and  bruit Psych-affect is appropriate, oriented x3 Neuro-no new focal deficits, no tremors   Data Review:   Micro Results Recent Results (from the past 240 hour(s))  SARS Coronavirus 2 by RT PCR (hospital order, performed in [facility] hospital lab) Nasopharyngeal Nasopharyngeal Swab     Status: None   Collection Time: 02/08/20  3:11 AM   Specimen: Nasopharyngeal Swab  Result Value Ref Range Status   SARS Coronavirus 2 NEGATIVE NEGATIVE Final    Comment: (NOTE) SARS-CoV-2 target nucleic acids are NOT DETECTED.  The SARS-CoV-2 RNA is generally  detectable in upper and lower respiratory specimens during the acute phase of infection. The lowest concentration of SARS-CoV-2 viral copies this assay can detect is 250 copies / mL. A negative result does not preclude SARS-CoV-2 infection and should not be used as the sole basis for treatment or other patient management decisions.  A negative result may occur with improper specimen collection / handling, submission of specimen other than nasopharyngeal swab, presence of viral mutation(s) within the areas targeted by this assay, and inadequate number of viral copies (<250 copies / mL). A negative result must be combined with clinical observations, patient history, and epidemiological information.  Fact Sheet for Patients:   StrictlyIdeas.no  Fact Sheet for Healthcare Providers: BankingDealers.co.za  This test is not yet approved or  cleared by the [place] FDA and has been authorized for detection and/or diagnosis of SARS-CoV-2 by FDA under an Emergency Use Authorization (EUA).  This EUA will remain in effect (meaning this test can be used) for the duration of the COVID-19 declaration under Section 564(b)(1) of the Act, 21 U.S.C. section 360bbb-3(b)(1), unless the authorization is terminated or revoked sooner.  Performed at [facility], [address], [place], [place] [address]     Culture, blood (Routine X 2) w Reflex to ID Panel     Status: None (Preliminary result)   Collection Time: 02/09/20 10:16 AM   Specimen: Right Antecubital; Blood  Result Value Ref Range Status   Specimen Description   Final    RIGHT ANTECUBITAL BOTTLES DRAWN AEROBIC AND ANAEROBIC   Special Requests   Final    Blood Culture adequate volume Performed at [facility], [address] [place], [place] [address]    Culture PENDING  Incomplete   Report Status PENDING  Incomplete  Culture, blood (Routine X 2) w Reflex to ID Panel     Status: None (Preliminary result)   Collection Time: 02/09/20 10:21 AM   Specimen: BLOOD RIGHT ARM  Result Value Ref Range Status   Specimen Description   Final    BLOOD RIGHT ARM BOTTLES DRAWN AEROBIC AND ANAEROBIC   Special Requests   Final    Blood Culture adequate volume Performed at [facility] [address] [place], [place] [address]    Culture PENDING  Incomplete   Report Status PENDING  Incomplete    Radiology Reports CT ABDOMEN PELVIS WO CONTRAST  Result Date: 02/08/2020 CLINICAL DATA:  Abdominal pain, distention EXAM: CT ABDOMEN AND PELVIS WITHOUT CONTRAST TECHNIQUE: Multidetector CT imaging of the abdomen and pelvis was performed following the standard protocol without IV contrast. COMPARISON:  01/01/2020 FINDINGS: Lower chest: Small bilateral pleural effusions. Left base atelectasis. Coronary artery calcifications. Heart is normal size. Hepatobiliary: Changes of cirrhosis with nodular contours. Multiple gallstones within the gallbladder. No focal hepatic abnormality or biliary ductal dilatation. Pancreas: No focal abnormality or ductal dilatation. Spleen: Splenomegaly with a craniocaudal length of 17.3 cm. No focal abnormality. Adrenals/Urinary Tract: No adrenal abnormality. No focal renal abnormality. No stones or hydronephrosis. Urinary bladder is unremarkable. Foley catheter present in the bladder. Stomach/Bowel: None Stomach, large and small  bowel grossly unremarkable. Few scattered left colonic diverticula. Vascular/Lymphatic: Heavily calcified aorta and iliac vessels. No evidence of aneurysm or adenopathy. Reproductive: No visible focal abnormality. Other: Moderate ascites in the abdomen and pelvis. Musculoskeletal: No acute bony abnormality. Degenerative disc and facet disease throughout the lumbar spine. IMPRESSION: Changes of cirrhosis with associated splenomegaly and moderate ascites. Cholelithiasis. Aortic atherosclerosis.  Coronary artery disease. Small bilateral pleural effusions.  Left base atelectasis. Electronically Signed   By: Rolm Baptise M.D.   On: 02/08/2020 02:00   DG Thoracic Spine W/Swimmers  Result Date: 01/19/2020 CLINICAL DATA:  Worsening thoracic spine pain over the past week. No known injury. EXAM: THORACIC SPINE - 3 VIEWS COMPARISON:  None. FINDINGS: No fracture or malalignment. Scattered anterior endplate spurring is worst in the lower thoracic spine. Paraspinous structures demonstrate no acute abnormality. IMPRESSION: No acute finding. Scattered thoracic degenerative disease. Electronically Signed   By: Inge Rise M.D.   On: 01/19/2020 17:20   DG Lumbar Spine Complete  Result Date: 01/19/2020 CLINICAL DATA:  Worsening low back pain over the past week. EXAM: LUMBAR SPINE - COMPLETE 4+ VIEW COMPARISON:  None. FINDINGS: No fracture or malalignment. Facet degenerative disease is severe at L5-S1. There is some anterior endplate spurring and mild loss of disc space height in the visualized lower thoracic spine and upper lumbar spine. Paraspinous structures demonstrate extensive atherosclerosis. IMPRESSION: No acute abnormality. 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 Facet arthropathy is severe at L5-S1. Aortic Atherosclerosis (ICD10-I70.0). Electronically Signed   By: Inge Rise M.D.   On: 01/19/2020 17:18   DG Chest Port 1 View  Result Date: 02/08/2020 CLINICAL DATA:  Shortness of breath  EXAM: PORTABLE CHEST 1 VIEW COMPARISON:  01/19/2020 FINDINGS: Right dialysis catheter remains in place, unchanged. Cardiomegaly, vascular congestion. No confluent opacities or effusions. No acute bony abnormality. IMPRESSION: Cardiomegaly, vascular congestion. Electronically Signed   By: Rolm Baptise M.D.   On: 02/08/2020 00:52   DG Chest Portable 1 View  Result Date: 01/19/2020 CLINICAL DATA:  Shortness of breath.  Missed dialysis EXAM: PORTABLE CHEST 1 VIEW COMPARISON:  01/01/2020 FINDINGS: Stable right IJ approach hemodialysis catheter. Cardiomegaly. Pulmonary vascular congestion with increased interstitial markings bilaterally. Suspect small left pleural effusion. No pneumothorax. IMPRESSION: 1. Cardiomegaly with pulmonary vascular congestion and interstitial edema. 2. Suspect small left pleural effusion. Electronically Signed   By: Davina Poke D.O.   On: 01/19/2020 14:36   DG Abd Portable 1V  Result Date: 01/19/2020 CLINICAL DATA:  74 year old male with back pain.  Nausea. EXAM: PORTABLE ABDOMEN - 1 VIEW COMPARISON:  CT abdomen pelvis dated 01/01/2020. FINDINGS: Evaluation is limited due to body habitus. An air-filled loop of bowel in the right hemiabdomen measuring approximately 3.7 cm in caliber, likely a small-bowel. There is moderate amount of stool throughout the colon. No free air. Degenerative changes of the spine and hips. No acute osseous pathology. IMPRESSION: 1. Mildly dilated small bowel loop in the right hemiabdomen may represent ileus or early obstruction. Clinical correlation recommended. 2. Moderate amount of stool throughout the colon. Electronically Signed   By: Anner Crete M.D.   On: 01/19/2020 16:52   [place] ASCITES (ABDOMEN LIMITED)  Result Date: 02/08/2020 CLINICAL DATA:  Alcoholic cirrhosis, ascites EXAM: LIMITED ABDOMEN ULTRASOUND FOR ASCITES TECHNIQUE: Limited ultrasound survey for ascites was performed in all four abdominal quadrants. COMPARISON:  CT abdomen and pelvis  02/08/2020 FINDINGS: Small pocket of ascites in the LEFT lower quadrant. Additional ascites is seen due extending deep into the pelvis, not accessible. No drainable ascites collection identified. IMPRESSION: Insufficient ascites for paracentesis. Electronically Signed   By: Lavonia Dana M.D.   On: 02/08/2020 10:47     CBC Recent Labs  Lab 02/07/20 2143 02/08/20 0525 02/09/20 0642  WBC 4.4 3.5* 4.5  HGB 8.9* 8.3* 8.0*  HCT 28.0* 25.8* 25.6*  PLT 155 136* 132*  MCV 93.3 92.8 93.4  MCH  29.7 29.9 29.2  MCHC 31.8 32.2 31.3  RDW 17.1* 16.9* 16.9*  LYMPHSABS 0.3*  --   --   MONOABS 0.3  --   --   EOSABS 0.2  --   --   BASOSABS 0.0  --   --     Chemistries  Recent Labs  Lab 02/07/20 2143 02/08/20 0525 02/09/20 0642  NA 120* 121* 123*  K 4.5 4.3 3.4*  CL 83* 84* 85*  CO2 20* 21* 24  GLUCOSE 95 92 223*  BUN 45* 47* 31*  CREATININE 4.53* 4.61* 3.24*  CALCIUM 9.0 8.7* 8.4*  MG  --   --  1.6*  AST 28 20  --   ALT 22 20  --   ALKPHOS 233* 200*  --   BILITOT 1.3* 1.2  --    ------------------------------------------------------------------------------------------------------------------ No results for input(s): CHOL, HDL, LDLCALC, TRIG, CHOLHDL, LDLDIRECT in the last 72 hours.  Lab Results  Component Value Date   HGBA1C 5.9 (H) 12/16/2019   ------------------------------------------------------------------------------------------------------------------ No results for input(s): TSH, T4TOTAL, T3FREE, THYROIDAB in the last 72 hours.  Invalid input(s): FREET3 ------------------------------------------------------------------------------------------------------------------ Recent Labs    02/09/20 0642  FERRITIN 589*  TIBC 270  IRON 53    Coagulation profile Recent Labs  Lab 02/08/20 0525  INR 1.1    No results for input(s): DDIMER in the last 72 hours.  Cardiac Enzymes No results for input(s): CKMB, TROPONINI, MYOGLOBIN in the last 168 hours.  Invalid input(s):  CK ------------------------------------------------------------------------------------------------------------------ No results found for: BNP   Roxan Hockey M.D on 02/09/2020 at 1:55 PM  Go to [email or web address] - for contact info  [facility] - Office  [phone]

## 2020-02-09 NOTE — Care Management Important Message (Signed)
Important Message  Patient Details  Name: Jeffrey [name] MRN: [id number] Date of Birth: May 07, 1946   Medicare Important Message Given:  Yes     [name] 02/09/2020, 1:44 PM

## 2020-02-09 NOTE — TOC Initial Note (Signed)
Transition of Care [facility]) - Initial/Assessment Note    Patient Details  Name: Jeffrey [name] MRN: [id number] Date of Birth: [birth year]  Transition of Care [facility]) CM/SW Contact:    Iona Beard, [place] Phone Number: 02/09/2020, 11:05 AM  Clinical Narrative:                 Pt admitted for alcoholic cirrhosis of liver with ascites. Pt lives at home with his wife. Pt is able to feed himself. Pt needs assistance in dressing and bathing. Pt is active with [facility] RN/PT. TOC spoke with Vaughan Basta at [facility] to confirm pt is active. Pts wife provides transportation. Pt has walker and rollators at home. Pts PCP is [name]. Pt denies SA. Pt feels safe returning home upon discharge. TOC to follow.  Expected Discharge Plan: [place] Barriers to Discharge: Continued Medical Work up   Patient Goals and CMS Choice Patient states their goals for this hospitalization and ongoing recovery are:: Return home with [facility]   Choice offered to / list presented to : NA  Expected Discharge Plan and Services Expected Discharge Plan: [place] Acute Care Choice: [place] ([place]) Living arrangements for the past 2 months: [place] Arranged: RN, PT [facility] Agency: [place] ([place]) Date HH Agency Contacted: 02/09/20 Time [place]: 1104 Representative spoke with at [place]: Vaughan Basta  Prior Living Arrangements/Services Living arrangements for the past 2 months: [place] Lives with:: Spouse Patient language and need for interpreter reviewed:: Yes Do you feel safe going back to the place where you live?: Yes        Care giver support system in place?: Yes (comment) Current home services: Home PT, Home RN Criminal Activity/Legal Involvement Pertinent to Current Situation/Hospitalization: No - Comment as needed  Activities of Daily Living Home Assistive  Devices/Equipment: Walker (specify type), Oxygen (wears 2L PRN) ADL Screening (condition at time of admission) Patient able to express need for assistance with ADLs?: Yes Does the patient have difficulty dressing or bathing?: Yes Independently performs ADLs?: No Communication: Needs assistance Is this a change from baseline?: Pre-admission baseline Dressing (OT): Needs assistance Is this a change from baseline?: Pre-admission baseline Grooming: Needs assistance Is this a change from baseline?: Pre-admission baseline Feeding: Independent Bathing: Needs assistance Is this a change from baseline?: Pre-admission baseline Toileting: Independent with device (comment) Is this a change from baseline?: Pre-admission baseline In/Out Bed: Independent Is this a change from baseline?: Pre-admission baseline Walks in Home: Independent with device (comment) (uses walker) Is this a change from baseline?: Pre-admission baseline Does the patient have difficulty walking or climbing stairs?: Yes Weakness of Legs: Both Weakness of Arms/Hands: Both  Permission Sought/Granted                  Emotional Assessment Appearance:: Appears stated age Attitude/Demeanor/Rapport: Engaged Affect (typically observed): Accepting, Happy Orientation: : Oriented to Self, Oriented to Place, Oriented to  Time, Oriented to Situation   Psych Involvement: No (comment)  Admission diagnosis:  Alcoholic cirrhosis of liver (HCC) [K70.30] Hyponatremia [E87.1] Uremia [N19] Dyspnea and respiratory abnormalities [R06.00, R06.89] Ascites [R18.8] Ascites due to alcoholic cirrhosis (HCC) [H29.92] Chronic bilateral pleural effusions [J90] Patient Active Problem List   Diagnosis Date Noted  .  Alcoholic cirrhosis of liver ([place]) 02/08/2020  . Back pain 02/08/2020  . Status post thoracentesis   . Hepatic encephalopathy ([place]) 01/01/2020  . Bilateral pleural effusion 12/31/2019  . Hypoglycemia 12/31/2019  . Hyperammonemia  ([place]) 12/31/2019  . Obesity (BMI 30-39.9) 12/31/2019  . Hypoalbuminemia 12/31/2019  . Nosebleed   . Goals of care, counseling/discussion   . Palliative care by specialist   . Acute hepatic encephalopathy 12/13/2019  . Encephalopathy acute   . Hyperkalemia   . Hyponatremia   . Anemia 11/16/2019  . Acute blood loss anemia 09/20/2019  . Gastrointestinal hemorrhage   . Acute on chronic anemia 09/18/2019  . Generalized weakness 09/18/2019  . Type 2 diabetes mellitus ([place]) 09/18/2019  . 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[place]) 09/18/2019  . Enteritis due to Clostridium difficile 08/31/2019  . Edema 04/11/2019  . Alcoholic cirrhosis ([place]) 03/70/4888  . Type 2 diabetes mellitus with stage 3 chronic kidney disease, with long-term current use of insulin ([place]) 10/20/2017  . Essential hypertension, benign 10/20/2017  . Morbid obesity ([place]) 10/20/2017  . Mixed hyperlipidemia 10/20/2017  . Ascites   . SBP (spontaneous bacterial peritonitis) ([place]) 01/12/2016  . Shigella dysenteriae 01/11/2016  . Diarrhea 01/10/2016  . Cirrhosis ([place]) 09/05/2012  . Alcoholic cirrhosis of liver with ascites ([place]) 03/09/2012  . Heme positive stool 03/09/2012  . Anemia due to chronic blood loss 03/09/2012  . Pancytopenia ([place]) 03/09/2012  . FH: colon cancer 03/09/2012  . Esophageal varices in alcoholic cirrhosis ([place]) 91/69/4503   PCP:  Monico Blitz, MD Pharmacy:   [place], [place] [address] [place] [address] Phone: [phone] Fax: [phone]     Social Determinants of Health (SDOH) Interventions    Readmission Risk Interventions No flowsheet data found.

## 2020-02-09 NOTE — Procedures (Signed)
     HEMODIALYSIS TREATMENT NOTE:  4 hour heparin-free HD completed via LUE AVF (15g/antegrade).  AVF tolerated prescribed flow with stable/low pressures.  Safe to remove catheter, if warranted by pending cultures.  Goal nearly met:  3.3L removed with minimal interruption in UF.   All blood was returned and hemostasis was achieved in 15 minutes.  Vancomycin 1.5g given at end of HD session.  Rockwell Alexandria, RN

## 2020-02-10 DIAGNOSIS — Z66 Do not resuscitate: Secondary | ICD-10-CM

## 2020-02-10 LAB — CBC
HCT: 27.6 % — ABNORMAL LOW (ref 39.0–52.0)
Hemoglobin: 8.7 g/dL — ABNORMAL LOW (ref 13.0–17.0)
MCH: 29.5 pg (ref 26.0–34.0)
MCHC: 31.5 g/dL (ref 30.0–36.0)
MCV: 93.6 fL (ref 80.0–100.0)
Platelets: 160 10*3/uL (ref 150–400)
RBC: 2.95 MIL/uL — ABNORMAL LOW (ref 4.22–5.81)
RDW: 17 % — ABNORMAL HIGH (ref 11.5–15.5)
WBC: 4.6 10*3/uL (ref 4.0–10.5)
nRBC: 0 % (ref 0.0–0.2)

## 2020-02-10 LAB — BASIC METABOLIC PANEL
Anion gap: 12 (ref 5–15)
BUN: 19 mg/dL (ref 8–23)
CO2: 29 mmol/L (ref 22–32)
Calcium: 8.9 mg/dL (ref 8.9–10.3)
Chloride: 86 mmol/L — ABNORMAL LOW (ref 98–111)
Creatinine, Ser: 2.54 mg/dL — ABNORMAL HIGH (ref 0.61–1.24)
GFR calc Af Amer: 28 mL/min — ABNORMAL LOW (ref >60)
GFR calc non Af Amer: 24 mL/min — ABNORMAL LOW (ref >60)
Glucose, Bld: 294 mg/dL — ABNORMAL HIGH (ref 70–99)
Potassium: 2.8 mmol/L — ABNORMAL LOW (ref 3.5–5.1)
Sodium: 127 mmol/L — ABNORMAL LOW (ref 135–145)

## 2020-02-10 LAB — GLUCOSE, CAPILLARY
Glucose-Capillary: 269 mg/dL — ABNORMAL HIGH (ref 70–99)
Glucose-Capillary: 295 mg/dL — ABNORMAL HIGH (ref 70–99)

## 2020-02-10 MED ORDER — DILTIAZEM HCL ER COATED BEADS 120 MG PO CP24: 120.0000 mg | ORAL_CAPSULE | Freq: Every day | ORAL | 11 refills | Status: DC

## 2020-02-10 MED ORDER — VANCOMYCIN IV (FOR PTA / DISCHARGE USE ONLY): 1000.0000 mg | INTRAVENOUS | 0 refills | Status: AC

## 2020-02-10 MED ORDER — MIRTAZAPINE 30 MG PO TABS: 30.0000 mg | ORAL_TABLET | Freq: Every day | ORAL | 3 refills | Status: AC

## 2020-02-10 NOTE — Progress Notes (Signed)
PHARMACY CONSULT NOTE FOR:  OUTPATIENT  PARENTERAL ANTIBIOTIC THERAPY (OPAT)  Indication: possible bacteremia  Regimen: Vancomycin 1000 mg IV post dialysis End date: Last dose 02/14/20  IV antibiotic discharge orders are pended. To discharging provider:  please sign these orders via discharge navigator,  Select New Orders & click on the button choice - Manage This Unsigned Work.     Thank you for allowing pharmacy to be a part of this patient's care.  Ramond Craver 02/10/2020, 2:03 PM

## 2020-02-10 NOTE — Discharge Summary (Signed)
Jeffrey [name], is a 74 y.o. male  DOB 04-06-1946  MRN [id number].  Admission date:  02/07/2020  Admitting Physician  Bernadette Hoit, DO  Discharge Date:  02/10/2020   Primary MD  Monico Blitz, MD  Recommendations for primary care physician for things to follow:   1)Very low-salt diet advised 2)Weigh yourself daily, call if you gain more than 3 pounds in 1 day or more than 5 pounds in 1 week as your diuretic medications may need to be adjusted 3)Limit your Fluid  intake to no more than 50 ounces (1.5 Liters) per day 4)Avoid ibuprofen/Advil/Aleve/Motrin/Goody Powders/Naproxen/BC powders/Meloxicam/Diclofenac/Indomethacin and other Nonsteroidal anti-inflammatory medications as these will make you more likely to bleed and can cause stomach ulcers, can also cause Kidney problems.  5)Please go to your Outpatient Hemodialysis center every Monday , Wednesday and Friday 6)Plan is for you to get iv Vancomycin post HD sessions on Monday 02/12/20 and Wednesday 02/14/20--- 7) please follow-up with radiologist in [place] on Thursday, 02/15/2020 for removal of your right sided neck hemodialysis catheter 8) it is important that you take your medications as prescribed including your insulin 9) please follow-up with endocrinologist Dr. Loni Beckwith, MD, Endocrinology, Diabetes & Metabolism-- [facility], [place], [place] [address], Phone Number- [phone]-----within a week for further adjustment of your insulin and diabetic treatments   Admission Diagnosis  Alcoholic cirrhosis of liver ([place]) [K70.30] Hyponatremia [E87.1] Uremia [N19] Dyspnea and respiratory abnormalities [R06.00, R06.89] Ascites [R18.8] Ascites due to alcoholic cirrhosis (HCC) [K12.24] Chronic bilateral pleural effusions [J90]   Discharge Diagnosis  Alcoholic cirrhosis of liver ([place]) [K70.30] Hyponatremia [E87.1] Uremia [N19] Dyspnea  and respiratory abnormalities [R06.00, R06.89] Ascites [R18.8] Ascites due to alcoholic cirrhosis ([place]) [S97.53] Chronic bilateral pleural effusions [J90]    Active Problems:   Fever and chills   Alcoholic cirrhosis of liver with ascites ([place])   Essential hypertension, benign   Mixed hyperlipidemia   Type 2 diabetes mellitus (HCC)   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[place])   Hyponatremia   Bilateral pleural effusion   Hypoalbuminemia   Back pain   Advanced care planning/counseling discussion   DNR (do not resuscitate)      Past Medical History:  Diagnosis Date  . A-fib [facility])    when initially stating dialysis january 2021 at [facility] per spouse; never heard anything else about it  . Alcoholic cirrhosis ([place])    patient reports completing hep a and b vaccines in 2006  . Anemia    has had 3 units of prbcs 2013  . Anxiety   . Arthritis   . Asymptomatic gallstones    Ultrasound in 2006  . C. difficile colitis   . Cholelithiasis   . Chronic kidney disease    Dialysis M/W/F/Sa  . Depression   . Diabetes ([place])   . Duodenal ulcer with hemorrhage    per patient in 2006 or 2007, records have been requested  . Dyspnea    low oxygen level -  has O2 2 L all  day  . 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 GERD (gastroesophageal reflux disease)   . Heart burn   . 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 HOH (hard of hearing)   . Hypertension   . Liver cirrhosis ([place])   . Splenomegaly    Ultrasound in 2006    Past Surgical History:  Procedure Laterality Date  . AGILE CAPSULE N/A 09/20/2019   Procedure: AGILE CAPSULE;  Surgeon: Daneil Dolin, MD;  [facility];  Service: Endoscopy;  Laterality: N/A;  . APPENDECTOMY    . AV FISTULA PLACEMENT Left 08/17/2019   Procedure: ARTERIOVENOUS (AV) FISTULA CREATION VERSES ARTERIOVENOUS GRAFT;  Surgeon: [name], MD;  Location: [facility];  Service: Vascular;  Laterality: Left;  . BASCILIC VEIN TRANSPOSITION Left  09/28/2019   Procedure: LEFT SECOND STAGE BASCILIC VEIN TRANSPOSITION;  Surgeon: [name], MD;  Location: [facility];  Service: Vascular;  Laterality: Left;  . BIOPSY  02/03/2018   Procedure: BIOPSY;  Surgeon: Daneil Dolin, MD;  [facility];  Service: Endoscopy;;  bx of gastric polyps  . BIOPSY  09/19/2019   Procedure: BIOPSY;  Surgeon: Danie Binder, MD;  [facility];  Service: Endoscopy;;  . CATARACT EXTRACTION Right   . COLONOSCOPY  03/10/2005   Rectal polyp as described above, removed with snare. Left sided  diverticula. The remainder of the colonic mucosa appeared normal. Inflammed polyp on path.  . COLONOSCOPY  04/1999   Dr. Thea Silversmith polyps removed,  Path showed hyperplastic  . COLONOSCOPY  10/2015   Dr. Britta Mccreedy: diverticulosis, single sessile tubular adenoma 3-70m in size removed from descending colon.   . COLONOSCOPY WITH ESOPHAGOGASTRODUODENOSCOPY (EGD)  03/31/2012   RIWP:[id number]. Colonic diverticulosis. tubular adenoma colon  . COLONOSCOPY WITH ESOPHAGOGASTRODUODENOSCOPY (EGD)  06/2019   FORSYTH: small esophageal varices without high risk stigmata, single large AVM on lesser curvature in gastric body s/p APC ablation, gastric antral and duodenal bulb polyposis. No significant source to explain transfusion dependent anemia. Colonoscopy with portal colopathy and diffuse edema, changes of Cdiff colitis on colonoscopy  . ESOPHAGEAL BANDING N/A 02/03/2018   Procedure: ESOPHAGEAL BANDING;  Surgeon: RDaneil Dolin MD;  [facility];  Service: Endoscopy;  Laterality: N/A;  . ESOPHAGOGASTRODUODENOSCOPY  01/15/2005   Three columns grade 1 to 2 esophageal varices, otherwise normal esophageal mucosa.  Esophagus was not manipulated otherwise./Nodularity of the antrum with overlying erosions, nonspecific finding. Path showed rare H.pylori  . ESOPHAGOGASTRODUODENOSCOPY  09/2008   Dr. RGaylene Brookscolumns of grade 2-3 esoph varices, only one column was  prominent. Portal gastropathy, multiple gastrc polyps at antrum, two were 2cm with black eschar, bulbar polyps, bulbar erosions  . ESOPHAGOGASTRODUODENOSCOPY  11/2004   Dr. FBrantley Stage3 esoph varices  . ESOPHAGOGASTRODUODENOSCOPY  03/31/2012   RMR: 4 columns(3-GR 2, 1-GR1) non-bleeding esophageal varices, portal gastropathy, small HH, early GAVE, multiple gastric polyps   . ESOPHAGOGASTRODUODENOSCOPY (EGD) WITH PROPOFOL N/A 02/03/2018   Dr. RGala Romney Esophageal varices, 3 columns grade 1-2.  Portal hypertensive gastropathy.  Multiple gastric polyps, biopsy consistent with hyperplastic.  .Marland KitchenESOPHAGOGASTRODUODENOSCOPY (EGD) WITH PROPOFOL N/A 09/19/2019   Fields: grade I and II esophageal varcies, mild portal hypertensive gastropathy, moderate gastritis but no H. pylori, single hyperplastic gastric polyp removed, obvious source for melena/transfusion dependent anemia not identified, may be due to friable gastric and duodenal mucosa in the setting of portal hypertension  . GASTRIC VARICES BANDING  03/31/2012  Procedure: GASTRIC VARICES BANDING;  Surgeon: Daneil Dolin, MD;  [facility];  Service: Endoscopy;  Laterality: N/A;  . GIVENS CAPSULE STUDY N/A 09/21/2019   Poor study with a lot of debris obstructing much of the view of the first approximate 3 hours out of 6 hours.  No obvious source of bleeding identified.  Sequela of bleeding and old blood seen in the form of"whisps" of blood, blood flecks mostly toward the end of the study.  [name] POLYPECTOMY  09/19/2019   Procedure: POLYPECTOMY;  Surgeon: Danie Binder, MD;  [facility];  Service: Endoscopy;;  . UMBILICAL HERNIA REPAIR  2017   exploratory laparotomy, abdominal washout     HPI  from the history and physical done on the day of admission:    Chief Complaint:  Back Pain  HPI: VAIDEN ADAMES is a 74 y.o. male with medical history significant for alcoholic liver cirrhosis, ESRD(MWF), hypertension, diabetes mellitus type  2, hyperlipidemia, transfusion dependent anemia, portal hypertensive gastropathy and esophageal varices who presents to the emergency department due to several days of abdominal pain and back pain..  Abdominal pain was described as numbing sensation around the RLQ and back pain was in the lumbosacral area.  Patient states that he missed 2 sessions of dialysis because of the back pain.  He also complained of abdominal distention and believes that he has increased fluid in his abdomen.  He complained of some nausea and vomiting as well, but denies chest pain, shortness of breath, headache, fever.  ED Course:  In the emergency department, she was slightly tachypneic, otherwise other vital signs were within normal range.  Except for BP-161/69.  Work-up in the ED showed normocytic anemia, hyponatremia, PSA creatinine for the first of 4.5.  SARS coronavirus 2 was negative.  CT abdomen pelvis showed changes of cirrhosis with associated splenomegaly and moderate ascites.  Chest x-ray showed cardiomegaly with vascular congestion.  Patient was treated with IV ceftriaxone and hospitalist was asked to admit.  For further evaluation and management.     Hospital Course:    Brief Summary:- 74 y.o.malewith medical history significant foralcoholic liver cirrhosis, ESRD(MWF), hypertension, diabetes mellitus type 2, hyperlipidemia, transfusion dependent anemia, portal hypertensive gastropathyandesophageal varicesadmitted on 02/08/2020 after admission back-to-back hemodialysis sessions   A/p 1)Alcoholic Liver Cirrhosis with Recurrent Ascites---patient apparently longer drinks alcohol -Per radiologist not enough fluid to attempt paracentesis at this time - c/n  Aldactone and Lasix - continue rifaximin and lactulose -Currently appears to be at baseline  2)Ascites and bilateral Pleural effusions---discussed with radiologist Dr. Astrid Divine enough fluid for thoracentesis or paracentesis  per imaging  studies  3)ESRD --typically Mondays Wednesdays Fridays -Patient continues to miss hemodialysis session frequently because he does not like to sit in the HD chair as outpatient and also because he likes the social interactions he gets when he comes into the hospital as per patient -He states his back feels better when he lays down,so comes to the hospital to get hemodialysis she can be laying down rather than sitting up in the outpatient hemodialysis unit -Discussed with nephrologist -Pt had serial/back-to-back hemodialysis sessions on 02/08/2020 and 02/09/2020  4)Tachyarrhythmia---patient flips between sinus tach on a flutter with variable conduction -PTA was on metoprolol, probably noncompliant, resume metoprolol,  Recent echo shows EF preserved -Patient declines for anticoagulation, he is notoriously noncompliant -  5)DM2-patient is confused about his insulin regimen, it is unclear how much insulin patient actually takes -He is advised to follow-up with endocrinologist  Dr. Loni Beckwith as outpatient -I doubt patient with follow-up as patient is notoriously noncompliant  6)Chronic diastolic heart failure -Echodone on8/2/2021showed LVEFof70-75% Volume status improved significantly with Lasix, Aldactone and back-to-back hemodialysis sessions   7)chronic hyponatremia---patient with history of underlying liver cirrhosis,no mental status changes at this time  8)Febrile illness---  no further fevers or chills in almost 48 hours  --- patient has right IJ HD catheter that was used for HD on 02/08/2020, patient then spiked a fever  ---blood cultures NGTD -Plan is to continue IV vancomycin which was started here after each hemodialysis session through 02/14/2020 -Patient is to follow-up with radiology in [place] as previously scheduled on 02/15/2020 for removal of right-sided HD catheter  9) social/ethics--- palliative care consult appreciated patient is a  DNR/DNI  Disposition---home with [place]  Code Status :  DNR  Family Communication:    (patient is alert, awake and coherent)   Consults  :  Nephrology/palliative care  Discharge Condition: stable  Follow UP   Follow-up Information    [facility] Follow [facility].   Specialty: Home Health Services Why: RN /PT       Cassandria Anger, MD. Call in 1 week(s).   Specialty: Endocrinology Why: for Diabetes Contact information: [address] [place] [address] [phone]              Diet and Activity recommendation:  As advised  Discharge Instructions    Discharge Instructions    [facility] pharmacist to adjust dose for Vancomycin, Aminoglycosides and other anti-infective therapies as requested by physician.   Complete by: As directed    [facility] to provide Cath Flo 84m   Complete by: As directed    Administer for PICC line occlusion and as ordered by physician for other access device issues.   Anaphylaxis Kit: Provided to treat any anaphylactic reaction to the medication being provided to the patient if First Dose or when requested by physician   Complete by: As directed    Epinephrine 1435mml vial / amp: Administer 0.35m95m0.35ml55mubcutaneously once for moderate to severe anaphylaxis, nurse to call physician and pharmacy when reaction occurs and call 911 if needed for immediate care   Diphenhydramine 50mg54mIV vial: Administer 25-50mg 77mM PRN for first dose reaction, rash, itching, mild reaction, nurse to call physician and pharmacy when reaction occurs   Sodium Chloride 0.9% NS 500ml I55mdminister if needed for hypovolemic blood pressure drop or as ordered by physician after call to physician with anaphylactic reaction   Call MD for:  difficulty breathing, headache or visual disturbances   Complete by: As directed    Call MD for:  persistant dizziness or light-headedness   Complete by: As directed    Call MD for:   persistant nausea and vomiting   Complete by: As directed    Call MD for:  severe uncontrolled pain   Complete by: As directed    Call MD for:  temperature >100.4   Complete by: As directed    Change dressing on IV access line weekly and PRN   Complete by: As directed    Diet - low sodium heart healthy   Complete by: As directed    Diet Carb Modified   Complete by: As directed    Discharge instructions   Complete by: As directed    1)Very low-salt diet advised 2)Weigh yourself daily, call if you gain more than 3 pounds in 1 day or more than  5 pounds in 1 week as your diuretic medications may need to be adjusted 3)Limit your Fluid  intake to no more than 50 ounces (1.5 Liters) per day 4)Avoid ibuprofen/Advil/Aleve/Motrin/Goody Powders/Naproxen/BC powders/Meloxicam/Diclofenac/Indomethacin and other Nonsteroidal anti-inflammatory medications as these will make you more likely to bleed and can cause stomach ulcers, can also cause Kidney problems.  5)Please go to your Outpatient Hemodialysis center every Monday , Wednesday and Friday 6)Plan is for you to get iv Vancomycin post HD sessions on Monday 02/12/20 and Wednesday 02/14/20--- 7) please follow-up with radiologist in [place] on Thursday, 02/15/2020 for removal of your right sided neck hemodialysis catheter 8) it is important that you take your medications as prescribed including your insulin 9) please follow-up with endocrinologist Dr. Loni Beckwith, MD, Endocrinology, Diabetes & Metabolism-- [facility], [place], [place] [address], Phone Number- [phone]-----within a week for further adjustment of your insulin and diabetic treatments   Flush IV access with Sodium Chloride 0.9% and Heparin 10 units/ml or 100 units/ml   Complete by: As directed    Home infusion instructions - [facility]   Complete by: As directed    Instructions: Flush IV access with Sodium Chloride 0.9% and Heparin 10units/ml or  100units/ml   Change dressing on IV access line: Weekly and PRN   Instructions Cath Flo 3m: Administer for PICC Line occlusion and as ordered by physician for other access device   [facility] pharmacist to adjust dose for: Vancomycin, Aminoglycosides and other anti-infective therapies as requested by physician   Increase activity slowly   Complete by: As directed    Method of administration may be changed at the discretion of home infusion pharmacist based upon assessment of the patient and/or caregiver's ability to self-administer the medication ordered   Complete by: As directed    No wound care   Complete by: As directed         Discharge Medications     Allergies as of 02/10/2020      Reactions   Chlorhexidine    Tape Itching, Rash      Medication List    TAKE these medications   acetaminophen 500 MG tablet Commonly known as: TYLENOL Take 500 mg by mouth every 6 (six) hours as needed for mild pain.   Alcohol Prep 70 % Pads Apply 1 each topically 3 (three) times daily.   cholestyramine 4 GM/DOSE powder Commonly known as: QUESTRAN Take 4 g by mouth daily.   diltiazem 120 MG 24 hr capsule Commonly known as: Cardizem CD Take 1 capsule (120 mg total) by mouth daily.   furosemide 40 MG tablet Commonly known as: LASIX Take 40 mg by mouth 2 (two) times daily.   gabapentin 100 MG capsule Commonly known as: NEURONTIN TAKE 1 CAPSULE BY MOUTH AT BEDTIME   HumaLOG KwikPen 100 UNIT/ML KwikPen Generic drug: insulin lispro Inject 50 Units into the skin as needed. Sliding scale   HYDROcodone-acetaminophen 5-325 MG tablet Commonly known as: NORCO/VICODIN Take 1 tablet by mouth 2 (two) times daily as needed.   hydrOXYzine 25 MG capsule Commonly known as: VISTARIL Take 25 mg by mouth 3 (three) times daily as needed.   iron polysaccharides 150 MG capsule Commonly known as: NIFEREX Take 150 mg by mouth at bedtime.   lactulose 10 GM/15ML solution Commonly  known as: CHRONULAC Take 45 mLs (30 g total) by mouth 3 (three) times daily.   metoprolol succinate 25 MG 24 hr tablet Commonly known as: TOPROL-XL Take 1  tablet (25 mg total) by mouth daily. Take at bedtime  on Monday ,Wednesday and Friday due to dialaylisis What changed:   when to take this  additional instructions   milk thistle 175 MG tablet Take 175 mg by mouth 3 (three) times daily.   mirtazapine 30 MG tablet Commonly known as: REMERON Take 1 tablet (30 mg total) by mouth at bedtime. What changed:   medication strength  how much to take   multivitamin with minerals Tabs tablet Take 1 tablet by mouth in the morning.   NovoFine 32G X 6 MM Misc Generic drug: Insulin Pen Needle Inject into the skin.   nystatin powder Commonly known as: MYCOSTATIN/NYSTOP Apply topically 3 (three) times daily. Into groin area and affected area   omega-3 acid ethyl esters 1 g capsule Commonly known as: LOVAZA Take 2 g by mouth 2 (two) times daily.   omeprazole 40 MG capsule Commonly known as: PRILOSEC Take 40 mg by mouth in the morning.   pravastatin 20 MG tablet Commonly known as: PRAVACHOL Take 20 mg by mouth in the morning.   PROBIOTIC DAILY PO Take 1 tablet by mouth in the morning.   rifaximin 550 MG Tabs tablet Commonly known as: Xifaxan Take 1 tablet (550 mg total) by mouth 2 (two) times daily.   spironolactone 50 MG tablet Commonly known as: ALDACTONE Take 50 mg by mouth in the morning.   tamsulosin 0.4 MG Caps capsule Commonly known as: FLOMAX Take 0.4 mg by mouth every evening.   thiamine 100 MG tablet Take 100 mg by mouth in the morning. Vitamin B-1   tiZANidine 2 MG tablet Commonly known as: ZANAFLEX Take 2 mg by mouth 2 (two) times daily.   [name] Generic drug: insulin degludec Inject 30 Units into the skin at bedtime. When blood sugars are up What changed: how much to take   triamcinolone cream 0.1 % Commonly  known as: KENALOG Apply 1 application topically 2 (two) times daily as needed (dry skin Itching).   vancomycin  IVPB Inject 1,000 mg into the vein every Monday, Wednesday, and Friday with hemodialysis for 2 doses. Indication:  Possible bacteremia/ infected HD catheter  First Dose: Yes Last Day of Therapy:  September 15th, 2021 Labs - _0 /11/21 1415         Major procedures and Radiology Reports - PLEASE review detailed and final reports for all details, in brief -   CT ABDOMEN PELVIS WO CONTRAST  Result Date: 02/08/2020 CLINICAL  DATA:  Abdominal pain, distention EXAM: CT ABDOMEN AND PELVIS WITHOUT CONTRAST TECHNIQUE: Multidetector CT imaging of the abdomen and pelvis was performed following the standard protocol without IV contrast. COMPARISON:  01/01/2020 FINDINGS: Lower chest: Small bilateral pleural effusions. Left base atelectasis. Coronary artery calcifications. Heart is normal size. Hepatobiliary: Changes of cirrhosis with nodular contours. Multiple gallstones within the gallbladder. No focal hepatic abnormality or biliary ductal  dilatation. Pancreas: No focal abnormality or ductal dilatation. Spleen: Splenomegaly with a craniocaudal length of 17.3 cm. No focal abnormality. Adrenals/Urinary Tract: No adrenal abnormality. No focal renal abnormality. No stones or hydronephrosis. Urinary bladder is unremarkable. Foley catheter present in the bladder. Stomach/Bowel: None Stomach, large and small bowel grossly unremarkable. Few scattered left colonic diverticula. Vascular/Lymphatic: Heavily calcified aorta and iliac vessels. No evidence of aneurysm or adenopathy. Reproductive: No visible focal abnormality. Other: Moderate ascites in the abdomen and pelvis. Musculoskeletal: No acute bony abnormality. Degenerative disc and facet disease throughout the lumbar spine. IMPRESSION: Changes of cirrhosis with associated splenomegaly and moderate ascites. Cholelithiasis. Aortic atherosclerosis.  Coronary artery disease. Small bilateral pleural effusions.  Left base atelectasis. Electronically Signed   By: Rolm Baptise M.D.   On: 02/08/2020 02:00   DG Thoracic Spine W/Swimmers  Result Date: 01/19/2020 CLINICAL DATA:  Worsening thoracic spine pain over the past week. No known injury. EXAM: THORACIC SPINE - 3 VIEWS COMPARISON:  None. FINDINGS: No fracture or malalignment. Scattered anterior endplate spurring is worst in the lower thoracic spine. Paraspinous structures demonstrate no acute abnormality. IMPRESSION: No acute finding. Scattered thoracic degenerative disease. Electronically Signed   By: Inge Rise M.D.   On: 01/19/2020 17:20   DG Lumbar Spine Complete  Result Date: 01/19/2020 CLINICAL DATA:  Worsening low back pain over the past week. EXAM: LUMBAR SPINE - COMPLETE 4+ VIEW COMPARISON:  None. FINDINGS: No fracture or malalignment. Facet degenerative disease is severe at L5-S1. There is some anterior endplate spurring and mild loss of disc space height in the visualized lower thoracic spine and upper lumbar spine. Paraspinous structures  demonstrate extensive atherosclerosis. IMPRESSION: No acute abnormality. 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 Facet arthropathy is severe at L5-S1. Aortic Atherosclerosis (ICD10-I70.0). Electronically Signed   By: Inge Rise M.D.   On: 01/19/2020 17:18   DG Chest Port 1 View  Result Date: 02/08/2020 CLINICAL DATA:  Shortness of breath EXAM: PORTABLE CHEST 1 VIEW COMPARISON:  01/19/2020 FINDINGS: Right dialysis catheter remains in place, unchanged. Cardiomegaly, vascular congestion. No confluent opacities or effusions. No acute bony abnormality. IMPRESSION: Cardiomegaly, vascular congestion. Electronically Signed   By: Rolm Baptise M.D.   On: 02/08/2020 00:52   DG Chest Portable 1 View  Result Date: 01/19/2020 CLINICAL DATA:  Shortness of breath.  Missed dialysis EXAM: PORTABLE CHEST 1 VIEW COMPARISON:  01/01/2020 FINDINGS: Stable right IJ approach hemodialysis catheter. Cardiomegaly. Pulmonary vascular congestion with increased interstitial markings bilaterally. Suspect small left pleural effusion. No pneumothorax. IMPRESSION: 1. Cardiomegaly with pulmonary vascular congestion and interstitial edema. 2. Suspect small left pleural effusion. Electronically Signed   By: Davina Poke D.O.   On: 01/19/2020 14:36   DG Abd Portable 1V  Result Date: 01/19/2020 CLINICAL DATA:  74 year old male with back pain.  Nausea. EXAM: PORTABLE ABDOMEN - 1 VIEW COMPARISON:  CT abdomen pelvis dated 01/01/2020. FINDINGS: Evaluation is limited due to body habitus. An air-filled loop of bowel in the right hemiabdomen measuring approximately 3.7 cm in caliber, likely a small-bowel. There is moderate amount of stool throughout  the colon. No free air. Degenerative changes of the spine and hips. No acute osseous pathology. IMPRESSION: 1. Mildly dilated small bowel loop in the right hemiabdomen may represent ileus or early obstruction. Clinical correlation recommended. 2. Moderate amount of stool  throughout the colon. Electronically Signed   By: Anner Crete M.D.   On: 01/19/2020 16:52   [place] ASCITES (ABDOMEN LIMITED)  Result Date: 02/08/2020 CLINICAL DATA:  Alcoholic cirrhosis, ascites EXAM: LIMITED ABDOMEN ULTRASOUND FOR ASCITES TECHNIQUE: Limited ultrasound survey for ascites was performed in all four abdominal quadrants. COMPARISON:  CT abdomen and pelvis 02/08/2020 FINDINGS: Small pocket of ascites in the LEFT lower quadrant. Additional ascites is seen due extending deep into the pelvis, not accessible. No drainable ascites collection identified. IMPRESSION: Insufficient ascites for paracentesis. Electronically Signed   By: Lavonia Dana M.D.   On: 02/08/2020 10:47   Micro Results   Recent Results (from the past 240 hour(s))  SARS Coronavirus 2 by RT PCR (hospital order, performed in [facility] hospital lab) Nasopharyngeal Nasopharyngeal Swab     Status: None   Collection Time: 02/08/20  3:11 AM   Specimen: Nasopharyngeal Swab  Result Value Ref Range Status   SARS Coronavirus 2 NEGATIVE NEGATIVE Final    Comment: (NOTE) SARS-CoV-2 target nucleic acids are NOT DETECTED.  The SARS-CoV-2 RNA is generally detectable in upper and lower respiratory specimens during the acute phase of infection. The lowest concentration of SARS-CoV-2 viral copies this assay can detect is 250 copies / mL. A negative result does not preclude SARS-CoV-2 infection and should not be used as the sole basis for treatment or other patient management decisions.  A negative result may occur with improper specimen collection / handling, submission of specimen other than nasopharyngeal swab, presence of viral mutation(s) within the areas targeted by this assay, and inadequate number of viral copies (<250 copies / mL). A negative result must be combined with clinical observations, patient history, and epidemiological information.  Fact Sheet for Patients:   StrictlyIdeas.no  Fact  Sheet for Healthcare Providers: BankingDealers.co.za  This test is not yet approved or  cleared by the [place] FDA and has been authorized for detection and/or diagnosis of SARS-CoV-2 by FDA under an Emergency Use Authorization (EUA).  This EUA will remain in effect (meaning this test can be used) for the duration of the COVID-19 declaration under Section 564(b)(1) of the Act, 21 U.S.C. section 360bbb-3(b)(1), unless the authorization is terminated or revoked sooner.  Performed at [facility], [address], [place], [place] [address]   Culture, blood (Routine X 2) w Reflex to ID Panel     Status: None (Preliminary result)   Collection Time: 02/09/20 10:16 AM   Specimen: Right Antecubital; Blood  Result Value Ref Range Status   Specimen Description   Final    RIGHT ANTECUBITAL BOTTLES DRAWN AEROBIC AND ANAEROBIC   Special Requests Blood Culture adequate volume  Final   Culture   Final    NO GROWTH 1 DAY Performed at [facility], [address], [place], [place] [address]    Report Status PENDING  Incomplete  Culture, blood (Routine X 2) w Reflex to ID Panel     Status: None (Preliminary result)   Collection Time: 02/09/20 10:21 AM   Specimen: BLOOD RIGHT ARM  Result Value Ref Range Status   Specimen Description   Final    BLOOD RIGHT ARM BOTTLES DRAWN AEROBIC AND ANAEROBIC   Special Requests Blood Culture adequate volume  Final  Culture   Final    NO GROWTH 1 DAY Performed at [facility], [address] [place], [place] [address]    Report Status PENDING  Incomplete    Today   Subjective    Jaleel Allen today has no new concerns -No further fevers no chills  No Nausea, Vomiting or Diarrhea Shortness of breath resolved after hemodialysis      Patient has been seen and examined prior to discharge   Objective   Blood pressure (!) 141/61, pulse 75, temperature 98.6 F (37 C), resp. rate 18, weight 99.1 kg, SpO2 99 %.   Intake/Output  Summary (Last 24 hours) at 02/10/2020 1421 Last data filed at 02/09/2020 1515 Gross per 24 hour  Intake --  Output 3365 ml  Net -3365 ml    Exam Gen:- Awake Alert,  In no apparent distress  HEENT:- [place].AT, No sclera icterus Neck-Supple Neck,No JVD,.  Right IJ hemodialysis catheter Lungs-  CTAB , fair symmetrical air movement CV- S1, S2 normal, irregular Abd-  +ve B.Sounds, Abd Soft, No tenderness,    Extremity/Skin:- pedal pulses present , left upper extremity AV fistula with thrill and bruit Psych-affect is appropriate, oriented x3 Neuro-generalized weakness, no new focal deficits, no tremors   Data Review   CBC w Diff:  Lab Results  Component Value Date   WBC 4.6 02/10/2020   HGB 8.7 (L) 02/10/2020   HCT 27.6 (L) 02/10/2020   HCT 28 02/25/2012   PLT 160 02/10/2020   PLT 125 02/25/2012   LYMPHOPCT 7 02/07/2020   BANDSPCT 0 03/28/2016   MONOPCT 6 02/07/2020   EOSPCT 4 02/07/2020   BASOPCT 1 02/07/2020    CMP:  Lab Results  Component Value Date   NA 127 (L) 02/10/2020   K 2.8 (L) 02/10/2020   CL 86 (L) 02/10/2020   CO2 29 02/10/2020   BUN 19 02/10/2020   CREATININE 2.54 (H) 02/10/2020   CREATININE 2.65 (H) 11/16/2019   PROT 7.1 02/08/2020   ALBUMIN 2.7 (L) 02/09/2020   BILITOT 1.2 02/08/2020   ALKPHOS 200 (H) 02/08/2020   AST 20 02/08/2020   ALT 20 02/08/2020  .   Total Discharge time is about 33 minutes  Roxan Hockey M.D on 02/10/2020 at 2:21 PM  Go to [email or web address] -  for contact info  [facility] - Office  [phone]

## 2020-02-10 NOTE — Discharge Instructions (Signed)
1)Very low-salt diet advised 2)Weigh yourself daily, call if you gain more than 3 pounds in 1 day or more than 5 pounds in 1 week as your diuretic medications may need to be adjusted 3)Limit your Fluid  intake to no more than 50 ounces (1.5 Liters) per day 4)Avoid ibuprofen/Advil/Aleve/Motrin/Goody Powders/Naproxen/BC powders/Meloxicam/Diclofenac/Indomethacin and other Nonsteroidal anti-inflammatory medications as these will make you more likely to bleed and can cause stomach ulcers, can also cause Kidney problems.  5)Please go to your Outpatient Hemodialysis center every Monday , Wednesday and Friday 6)Plan is for you to get iv Vancomycin post HD sessions on Monday 02/12/20 and Wednesday 02/14/20--- 7) please follow-up with radiologist in [place] on Thursday, 02/15/2020 for removal of your right sided neck hemodialysis catheter 8) it is important that you take your medications as prescribed including your insulin 9) please follow-up with endocrinologist Dr. Loni Beckwith, MD, Endocrinology, Diabetes & Metabolism-- [facility], [address] [place], [place] [address], Phone Number- [phone]-----within a week for further adjustment of your insulin and diabetic treatments

## 2020-02-10 NOTE — TOC Transition Note (Signed)
Transition of Care [facility]) - CM/SW Discharge Note   Patient Details  Name: Jeffrey [name] MRN: [id number] Date of Birth: April 13, 1946  Transition of Care [facility]) CM/SW Contact:  Boneta Lucks, RN Phone Number: 02/10/2020, 3:40 PM   Clinical Narrative:    Patient discharging home.  Resumption orders placed for [facility] HHPT/RN.  Patient needs IV antibiotics. TOC called [facility] in [place] to update them with Monday med order and faxed OPAT order to them.    Final next level of care: [place] Barriers to Discharge: Continued Medical Work up   Patient Goals and CMS Choice Patient states their goals for this hospitalization and ongoing recovery are:: Return home with [facility]   Choice offered to / list presented to : NA   Discharge Plan and Services     Post Acute Care Choice: Home Health ([place])           [place] Arranged: RN, PT [facility] Agency: [place] ([facility]) Date [place]: 02/09/20 Time [place]: 1104 Representative spoke with at [place]: Vaughan Basta

## 2020-02-12 DIAGNOSIS — T82898A Other specified complication of vascular prosthetic devices, implants and grafts, initial encounter: Secondary | ICD-10-CM | POA: Diagnosis not present

## 2020-02-12 DIAGNOSIS — Z992 Dependence on renal dialysis: Secondary | ICD-10-CM | POA: Diagnosis not present

## 2020-02-12 DIAGNOSIS — R509 Fever, unspecified: Secondary | ICD-10-CM | POA: Diagnosis not present

## 2020-02-12 DIAGNOSIS — N186 End stage renal disease: Secondary | ICD-10-CM | POA: Diagnosis not present

## 2020-02-13 ENCOUNTER — Encounter (HOSPITAL_COMMUNITY): Payer: Self-pay

## 2020-02-13 ENCOUNTER — Inpatient Hospital Stay (HOSPITAL_COMMUNITY): Payer: Medicare Other

## 2020-02-13 ENCOUNTER — Other Ambulatory Visit: Payer: Self-pay | Admitting: *Deleted

## 2020-02-13 ENCOUNTER — Other Ambulatory Visit: Payer: Self-pay

## 2020-02-13 VITALS — BP 118/46 | HR 61 | Temp 97.1°F | Resp 18

## 2020-02-13 DIAGNOSIS — Z794 Long term (current) use of insulin: Secondary | ICD-10-CM | POA: Diagnosis not present

## 2020-02-13 DIAGNOSIS — Z87891 Personal history of nicotine dependence: Secondary | ICD-10-CM | POA: Diagnosis not present

## 2020-02-13 DIAGNOSIS — D631 Anemia in chronic kidney disease: Secondary | ICD-10-CM | POA: Diagnosis not present

## 2020-02-13 DIAGNOSIS — E538 Deficiency of other specified B group vitamins: Secondary | ICD-10-CM | POA: Diagnosis not present

## 2020-02-13 DIAGNOSIS — Z800 Family history of malignant neoplasm of digestive organs: Secondary | ICD-10-CM | POA: Diagnosis not present

## 2020-02-13 DIAGNOSIS — N186 End stage renal disease: Secondary | ICD-10-CM | POA: Diagnosis not present

## 2020-02-13 DIAGNOSIS — Z803 Family history of malignant neoplasm of breast: Secondary | ICD-10-CM | POA: Diagnosis not present

## 2020-02-13 DIAGNOSIS — K7210 Chronic hepatic failure without coma: Secondary | ICD-10-CM | POA: Diagnosis not present

## 2020-02-13 DIAGNOSIS — Z79899 Other long term (current) drug therapy: Secondary | ICD-10-CM | POA: Diagnosis not present

## 2020-02-13 DIAGNOSIS — E611 Iron deficiency: Secondary | ICD-10-CM | POA: Diagnosis not present

## 2020-02-13 DIAGNOSIS — Z992 Dependence on renal dialysis: Secondary | ICD-10-CM | POA: Diagnosis not present

## 2020-02-13 DIAGNOSIS — K922 Gastrointestinal hemorrhage, unspecified: Secondary | ICD-10-CM | POA: Diagnosis not present

## 2020-02-13 DIAGNOSIS — D649 Anemia, unspecified: Secondary | ICD-10-CM

## 2020-02-13 LAB — PTH, INTACT AND CALCIUM
Calcium, Total (PTH): 8.6 mg/dL (ref 8.6–10.2)
PTH: 77 pg/mL — ABNORMAL HIGH (ref 15–65)

## 2020-02-13 MED ORDER — SODIUM CHLORIDE 0.9 % IV SOLN: Freq: Once | INTRAVENOUS | Status: AC

## 2020-02-13 MED ORDER — SODIUM CHLORIDE 0.9 % IV SOLN
510.0000 mg | Freq: Once | INTRAVENOUS | Status: AC
  Administered 2020-02-13: 510 mg via INTRAVENOUS
  Filled 2020-02-13: qty 510

## 2020-02-13 NOTE — Progress Notes (Signed)
Patient tolerated iron infusion with no complaints voiced.  Peripheral IV site clean and dry with good blood return noted before and after infusion.  Band aid applied.  VSS with discharge and left in satisfactory condition with no s/s of distress noted.   

## 2020-02-13 NOTE — Patient Outreach (Signed)
[facility]) Care Management  02/13/2020  EBAN WEICK 04/10/1946 [id number]   Telephone Assessment-Post-op d/c Provider to completed Transition of Care  RN spoke with pt's spouse [name] who reports pt is doing well and has spoke with his primary provider's office on yesterday. States pt has an appointment today a tAnnie [facility] for an iron infusion. Confirm pt's went to dialysis on yesterday. Glucose readings this AM fasting was 135. Will continue to encourage spouse to educate pt on the current plan of care discussed and will follow up in the next few weeks concerning pt's ongoing management of care. Will encourgaed adherence to all scheduled medical appointments and prescribed medications to prevention readmissions. Will also verify [place] will resume services (spouse has called and LVM with this request directly to the therapist).  Will follow up in few weeks with ongoing case management services.  Raina Mina, RN Care Management Coordinator [place] Office [phone]

## 2020-02-14 DIAGNOSIS — N2581 Secondary hyperparathyroidism of renal origin: Secondary | ICD-10-CM | POA: Diagnosis not present

## 2020-02-14 DIAGNOSIS — N186 End stage renal disease: Secondary | ICD-10-CM | POA: Diagnosis not present

## 2020-02-14 DIAGNOSIS — Z992 Dependence on renal dialysis: Secondary | ICD-10-CM | POA: Diagnosis not present

## 2020-02-14 DIAGNOSIS — T82898A Other specified complication of vascular prosthetic devices, implants and grafts, initial encounter: Secondary | ICD-10-CM | POA: Diagnosis not present

## 2020-02-14 DIAGNOSIS — R509 Fever, unspecified: Secondary | ICD-10-CM | POA: Diagnosis not present

## 2020-02-14 DIAGNOSIS — E878 Other disorders of electrolyte and fluid balance, not elsewhere classified: Secondary | ICD-10-CM | POA: Diagnosis not present

## 2020-02-14 LAB — ACID FAST CULTURE WITH REFLEXED SENSITIVITIES (MYCOBACTERIA): Acid Fast Culture: NEGATIVE

## 2020-02-14 LAB — CULTURE, BLOOD (ROUTINE X 2)
Culture: NO GROWTH
Culture: NO GROWTH
Special Requests: ADEQUATE
Special Requests: ADEQUATE

## 2020-02-15 ENCOUNTER — Other Ambulatory Visit: Payer: Self-pay

## 2020-02-15 ENCOUNTER — Ambulatory Visit (HOSPITAL_COMMUNITY)
Admission: RE | Admit: 2020-02-15 | Discharge: 2020-02-15 | Disposition: A | Discharge status: Home / Self Care | Payer: Medicare Other | Source: Ambulatory Visit | Attending: Medical | Admitting: Medical

## 2020-02-15 DIAGNOSIS — Z4901 Encounter for fitting and adjustment of extracorporeal dialysis catheter: Secondary | ICD-10-CM | POA: Diagnosis not present

## 2020-02-15 DIAGNOSIS — N186 End stage renal disease: Secondary | ICD-10-CM | POA: Insufficient documentation

## 2020-02-15 HISTORY — PX: IR REMOVAL TUN CV CATH W/O FL: IMG2289

## 2020-02-15 MED ORDER — LIDOCAINE HCL 1 % IJ SOLN
INTRAMUSCULAR | Status: DC | PRN
  Administered 2020-02-15: 10 mL

## 2020-02-15 MED ORDER — LIDOCAINE HCL 1 % IJ SOLN
INTRAMUSCULAR | Status: AC
  Filled 2020-02-15: qty 20

## 2020-02-15 NOTE — Procedures (Signed)
Successful removal of right IJ tunneled HD catheter.   After obtaining consent and performing a time-out, the right upper chest was prepped and draped in the normal sterile fashion. The heparin was removed from both ports. 1% lidocaine was used for local anesthesia. Using gentle blunt dissection the cuff of the catheter was exposed and the catheter was removed in its entirety. Pressure was held until hemostasis was obtained. A sterile dressing was applied. The patient tolerated the procedure well with no immediate complications.   Soyla Dryer, [place] [phone] 02/15/2020, 12:32 PM

## 2020-02-16 DIAGNOSIS — T82898A Other specified complication of vascular prosthetic devices, implants and grafts, initial encounter: Secondary | ICD-10-CM | POA: Diagnosis not present

## 2020-02-16 DIAGNOSIS — N186 End stage renal disease: Secondary | ICD-10-CM | POA: Diagnosis not present

## 2020-02-16 DIAGNOSIS — R509 Fever, unspecified: Secondary | ICD-10-CM | POA: Diagnosis not present

## 2020-02-16 DIAGNOSIS — Z992 Dependence on renal dialysis: Secondary | ICD-10-CM | POA: Diagnosis not present

## 2020-02-19 ENCOUNTER — Other Ambulatory Visit: Payer: Self-pay

## 2020-02-19 ENCOUNTER — Emergency Department (HOSPITAL_COMMUNITY): Payer: Medicare Other

## 2020-02-19 ENCOUNTER — Observation Stay (HOSPITAL_COMMUNITY)
Admission: EM | Admit: 2020-02-19 | Discharge: 2020-02-20 | Disposition: A | Discharge status: Home / Self Care | Payer: Medicare Other | Attending: Internal Medicine | Admitting: Internal Medicine

## 2020-02-19 ENCOUNTER — Encounter (HOSPITAL_COMMUNITY): Payer: Self-pay | Admitting: Emergency Medicine

## 2020-02-19 DIAGNOSIS — I482 Chronic atrial fibrillation, unspecified: Secondary | ICD-10-CM | POA: Diagnosis not present

## 2020-02-19 DIAGNOSIS — Z20822 Contact with and (suspected) exposure to covid-19: Secondary | ICD-10-CM | POA: Diagnosis not present

## 2020-02-19 DIAGNOSIS — I132 Hypertensive heart and chronic kidney disease with heart failure and with stage 5 chronic kidney disease, or end stage renal disease: Secondary | ICD-10-CM | POA: Diagnosis not present

## 2020-02-19 DIAGNOSIS — E871 Hypo-osmolality and hyponatremia: Secondary | ICD-10-CM | POA: Diagnosis not present

## 2020-02-19 DIAGNOSIS — Z23 Encounter for immunization: Secondary | ICD-10-CM | POA: Insufficient documentation

## 2020-02-19 DIAGNOSIS — I10 Essential (primary) hypertension: Secondary | ICD-10-CM | POA: Diagnosis not present

## 2020-02-19 DIAGNOSIS — D631 Anemia in chronic kidney disease: Secondary | ICD-10-CM | POA: Diagnosis not present

## 2020-02-19 DIAGNOSIS — Z79899 Other long term (current) drug therapy: Secondary | ICD-10-CM | POA: Insufficient documentation

## 2020-02-19 DIAGNOSIS — Z992 Dependence on renal dialysis: Secondary | ICD-10-CM | POA: Diagnosis not present

## 2020-02-19 DIAGNOSIS — Y92099 Unspecified place in other non-institutional residence as the place of occurrence of the external cause: Secondary | ICD-10-CM | POA: Diagnosis not present

## 2020-02-19 DIAGNOSIS — R2681 Unsteadiness on feet: Secondary | ICD-10-CM | POA: Insufficient documentation

## 2020-02-19 DIAGNOSIS — Z87891 Personal history of nicotine dependence: Secondary | ICD-10-CM | POA: Diagnosis not present

## 2020-02-19 DIAGNOSIS — J90 Pleural effusion, not elsewhere classified: Secondary | ICD-10-CM | POA: Diagnosis not present

## 2020-02-19 DIAGNOSIS — N186 End stage renal disease: Secondary | ICD-10-CM | POA: Diagnosis not present

## 2020-02-19 DIAGNOSIS — I5032 Chronic diastolic (congestive) heart failure: Secondary | ICD-10-CM | POA: Insufficient documentation

## 2020-02-19 DIAGNOSIS — S0990XA Unspecified injury of head, initial encounter: Secondary | ICD-10-CM | POA: Diagnosis not present

## 2020-02-19 DIAGNOSIS — I120 Hypertensive chronic kidney disease with stage 5 chronic kidney disease or end stage renal disease: Secondary | ICD-10-CM | POA: Diagnosis not present

## 2020-02-19 DIAGNOSIS — K7460 Unspecified cirrhosis of liver: Secondary | ICD-10-CM

## 2020-02-19 DIAGNOSIS — Z794 Long term (current) use of insulin: Secondary | ICD-10-CM | POA: Diagnosis not present

## 2020-02-19 DIAGNOSIS — E785 Hyperlipidemia, unspecified: Secondary | ICD-10-CM | POA: Insufficient documentation

## 2020-02-19 DIAGNOSIS — J9811 Atelectasis: Secondary | ICD-10-CM | POA: Diagnosis not present

## 2020-02-19 DIAGNOSIS — W06XXXA Fall from bed, initial encounter: Secondary | ICD-10-CM | POA: Insufficient documentation

## 2020-02-19 DIAGNOSIS — R220 Localized swelling, mass and lump, head: Secondary | ICD-10-CM | POA: Diagnosis not present

## 2020-02-19 DIAGNOSIS — W19XXXA Unspecified fall, initial encounter: Secondary | ICD-10-CM

## 2020-02-19 DIAGNOSIS — E1122 Type 2 diabetes mellitus with diabetic chronic kidney disease: Secondary | ICD-10-CM | POA: Diagnosis not present

## 2020-02-19 LAB — CBC WITH DIFFERENTIAL/PLATELET
Abs Immature Granulocytes: 0.01 K/uL (ref 0.00–0.07)
Basophils Absolute: 0 K/uL (ref 0.0–0.1)
Basophils Relative: 0 %
Eosinophils Absolute: 0.2 K/uL (ref 0.0–0.5)
Eosinophils Relative: 4 %
HCT: 28 % — ABNORMAL LOW (ref 39.0–52.0)
Hemoglobin: 8.5 g/dL — ABNORMAL LOW (ref 13.0–17.0)
Immature Granulocytes: 0 %
Lymphocytes Relative: 6 %
Lymphs Abs: 0.3 K/uL — ABNORMAL LOW (ref 0.7–4.0)
MCH: 30.1 pg (ref 26.0–34.0)
MCHC: 30.4 g/dL (ref 30.0–36.0)
MCV: 99.3 fL (ref 80.0–100.0)
Monocytes Absolute: 0.3 K/uL (ref 0.1–1.0)
Monocytes Relative: 6 %
Neutro Abs: 3.8 K/uL (ref 1.7–7.7)
Neutrophils Relative %: 84 %
Platelets: 152 K/uL (ref 150–400)
RBC: 2.82 MIL/uL — ABNORMAL LOW (ref 4.22–5.81)
RDW: 18.5 % — ABNORMAL HIGH (ref 11.5–15.5)
WBC: 4.5 K/uL (ref 4.0–10.5)
nRBC: 0 % (ref 0.0–0.2)

## 2020-02-19 LAB — BASIC METABOLIC PANEL
Anion gap: 13 (ref 5–15)
BUN: 42 mg/dL — ABNORMAL HIGH (ref 8–23)
CO2: 22 mmol/L (ref 22–32)
Calcium: 9 mg/dL (ref 8.9–10.3)
Chloride: 86 mmol/L — ABNORMAL LOW (ref 98–111)
Creatinine, Ser: 3.99 mg/dL — ABNORMAL HIGH (ref 0.61–1.24)
GFR calc Af Amer: 16 mL/min — ABNORMAL LOW (ref >60)
GFR calc non Af Amer: 14 mL/min — ABNORMAL LOW (ref >60)
Glucose, Bld: 145 mg/dL — ABNORMAL HIGH (ref 70–99)
Potassium: 4.5 mmol/L (ref 3.5–5.1)
Sodium: 121 mmol/L — ABNORMAL LOW (ref 135–145)

## 2020-02-19 LAB — AMMONIA: Ammonia: 62 umol/L — ABNORMAL HIGH (ref 9–35)

## 2020-02-19 LAB — SARS CORONAVIRUS 2 BY RT PCR (HOSPITAL ORDER, PERFORMED IN ~~LOC~~ HOSPITAL LAB): SARS Coronavirus 2: NEGATIVE

## 2020-02-19 MED ORDER — LACTULOSE 10 GM/15ML PO SOLN
30.0000 g | Freq: Three times a day (TID) | ORAL | Status: DC
  Administered 2020-02-19 – 2020-02-20 (×2): 30 g via ORAL
  Filled 2020-02-19 (×2): qty 60

## 2020-02-19 MED ORDER — POLYETHYLENE GLYCOL 3350 17 G PO PACK: 17.0000 g | PACK | Freq: Every day | ORAL | Status: DC | PRN

## 2020-02-19 MED ORDER — MORPHINE SULFATE (PF) 2 MG/ML IV SOLN: 2.0000 mg | INTRAVENOUS | Status: DC | PRN

## 2020-02-19 MED ORDER — MIRTAZAPINE 15 MG PO TABS
30.0000 mg | ORAL_TABLET | Freq: Every day | ORAL | Status: DC
  Administered 2020-02-19: 30 mg via ORAL
  Filled 2020-02-19: qty 2

## 2020-02-19 MED ORDER — VENLAFAXINE HCL 75 MG PO TABS
75.0000 mg | ORAL_TABLET | Freq: Three times a day (TID) | ORAL | Status: DC
  Filled 2020-02-19 (×6): qty 1

## 2020-02-19 MED ORDER — CHLORHEXIDINE GLUCONATE CLOTH 2 % EX PADS: 6.0000 | MEDICATED_PAD | Freq: Every day | CUTANEOUS | Status: DC

## 2020-02-19 MED ORDER — DILTIAZEM HCL ER COATED BEADS 120 MG PO CP24
120.0000 mg | ORAL_CAPSULE | Freq: Every day | ORAL | Status: DC
  Administered 2020-02-20: 120 mg via ORAL
  Filled 2020-02-19 (×2): qty 1

## 2020-02-19 MED ORDER — ACETAMINOPHEN 650 MG RE SUPP: 650.0000 mg | Freq: Four times a day (QID) | RECTAL | Status: DC | PRN

## 2020-02-19 MED ORDER — METOPROLOL SUCCINATE ER 25 MG PO TB24: 25.0000 mg | ORAL_TABLET | ORAL | Status: DC

## 2020-02-19 MED ORDER — FUROSEMIDE 40 MG PO TABS
40.0000 mg | ORAL_TABLET | Freq: Two times a day (BID) | ORAL | Status: DC
  Administered 2020-02-19 – 2020-02-20 (×2): 40 mg via ORAL
  Filled 2020-02-19 (×2): qty 1

## 2020-02-19 MED ORDER — HEPARIN SODIUM (PORCINE) 5000 UNIT/ML IJ SOLN
5000.0000 [IU] | Freq: Three times a day (TID) | INTRAMUSCULAR | Status: DC
  Administered 2020-02-19 – 2020-02-20 (×2): 5000 [IU] via SUBCUTANEOUS
  Filled 2020-02-19 (×2): qty 1

## 2020-02-19 MED ORDER — TETANUS-DIPHTH-ACELL PERTUSSIS 5-2.5-18.5 LF-MCG/0.5 IM SUSP
0.5000 mL | Freq: Once | INTRAMUSCULAR | Status: AC
  Administered 2020-02-19: 0.5 mL via INTRAMUSCULAR
  Filled 2020-02-19: qty 0.5

## 2020-02-19 MED ORDER — SPIRONOLACTONE 25 MG PO TABS
50.0000 mg | ORAL_TABLET | Freq: Every day | ORAL | Status: DC
  Filled 2020-02-19 (×2): qty 2

## 2020-02-19 MED ORDER — ONDANSETRON HCL 4 MG/2ML IJ SOLN
4.0000 mg | Freq: Four times a day (QID) | INTRAMUSCULAR | Status: DC | PRN
  Administered 2020-02-20: 4 mg via INTRAVENOUS
  Filled 2020-02-19: qty 2

## 2020-02-19 MED ORDER — HYDROCODONE-ACETAMINOPHEN 5-325 MG PO TABS: 1.0000 | ORAL_TABLET | Freq: Two times a day (BID) | ORAL | Status: DC | PRN

## 2020-02-19 MED ORDER — POLYSACCHARIDE IRON COMPLEX 150 MG PO CAPS
150.0000 mg | ORAL_CAPSULE | Freq: Every day | ORAL | Status: DC
  Filled 2020-02-19 (×3): qty 1

## 2020-02-19 MED ORDER — GABAPENTIN 100 MG PO CAPS
100.0000 mg | ORAL_CAPSULE | Freq: Every day | ORAL | Status: DC
  Administered 2020-02-19: 100 mg via ORAL
  Filled 2020-02-19: qty 1

## 2020-02-19 MED ORDER — PRAVASTATIN SODIUM 20 MG PO TABS
20.0000 mg | ORAL_TABLET | Freq: Every day | ORAL | Status: DC
  Filled 2020-02-19: qty 1

## 2020-02-19 MED ORDER — CYCLOBENZAPRINE HCL 10 MG PO TABS
10.0000 mg | ORAL_TABLET | Freq: Every day | ORAL | Status: DC
  Administered 2020-02-19: 10 mg via ORAL
  Filled 2020-02-19: qty 1

## 2020-02-19 MED ORDER — RIFAXIMIN 550 MG PO TABS
550.0000 mg | ORAL_TABLET | Freq: Two times a day (BID) | ORAL | Status: DC
  Administered 2020-02-19 – 2020-02-20 (×2): 550 mg via ORAL
  Filled 2020-02-19 (×2): qty 1

## 2020-02-19 MED ORDER — ACETAMINOPHEN 325 MG PO TABS: 650.0000 mg | ORAL_TABLET | Freq: Four times a day (QID) | ORAL | Status: DC | PRN

## 2020-02-19 MED ORDER — ONDANSETRON HCL 4 MG PO TABS: 4.0000 mg | ORAL_TABLET | Freq: Four times a day (QID) | ORAL | Status: DC | PRN

## 2020-02-19 MED ORDER — HYDROXYZINE HCL 25 MG PO TABS: 25.0000 mg | ORAL_TABLET | Freq: Three times a day (TID) | ORAL | Status: DC | PRN

## 2020-02-19 NOTE — H&P (Signed)
TRH H&P    Patient Demographics:    Jeffrey [name], is a 74 y.o. male  MRN: [id number]  DOB - July 18, 1945  Admit Date - 02/19/2020  Referring MD/NP/PA: Alfonzo [name]  Outpatient Primary MD for the patient is Jeffrey Blitz, MD  Patient coming from: Home  Chief complaint- Fall   HPI:    Jeffrey [name]  is a 74 y.o. male, with history of liver cirrhosis, HTN, GERD, DMII, ESRD, anemia, and more presents to the ED with a c/c of fall. Patient reports that he rolled out of bed at 7:00am while he was asleep. His head hit and open drawer from his dresser. He did not black out. He reports that it took three people to get him off of the ground. He reports that he knew he would need cleared by ER before dialysis would accept him. Patient reports that he has had generalized weakness over the last one two to weeks. It has been getting worse. Patient reports that he has had PT, but not since his last discharge on 9/11. Patient reports that he has been compliant with his medication, he has had a normal appetite, and he has not felt ill, aside from this weakness. He does report that he has had dyspnea over last 2 days. Last dialysis was on 9/17. Patient reports that he does make urine, and there has been no change in output, and no change in color or odor. Patient does have a chronic foley. He doesn't seem to have an understanding of why he has it - he reports that it is convenient.   Patient denies fatigue. Wife reported to ED provider that patient had been sleeping all night and all day. She was concerned, because this is abnormal for him. She did not report that he was difficult to arouse. During my exam, he is drowsy, but easily woken, and oriented.  Patient denies any fevers, nausea, vomiting, chest pain, back pain, or any other complaints.   Patient has left arm fistula for dialysis access. His normal schedule is MWF.   IN the ED T 98.1, HR 86,  R 18, BP 120/84 100% CT head = no acute intracranial abnormalities.  CXR = Patchy left basilar consolidation could reflect atelectasis or airspace disease Na 121 K+ 4.5 BUN/ Cr 42/3.99      Review of systems:    In addition to the HPI above,  Review of Systems  Constitutional: Positive for malaise/fatigue. Negative for chills, fever and weight loss.  HENT: Negative for congestion, sinus pain and sore throat.   Eyes: Negative for blurred vision, double vision and photophobia.  Respiratory: Negative for cough, hemoptysis, shortness of breath and wheezing.   Cardiovascular: Positive for orthopnea and leg swelling. Negative for chest pain and palpitations.  Gastrointestinal: Negative for abdominal pain, constipation, diarrhea, nausea and vomiting.  Genitourinary: Negative for dysuria, flank pain, frequency, hematuria and urgency.  Musculoskeletal: Negative for myalgias.  Skin: Negative for itching and rash.  Neurological: Positive for weakness. Negative for dizziness, speech change, focal weakness and headaches.  Endo/Heme/Allergies: Negative  for polydipsia.  Psychiatric/Behavioral: Negative for substance abuse.    All other systems reviewed and are negative.    Past History of the following :    Past Medical History:  Diagnosis Date  . A-fib [facility])    when initially stating dialysis january 2021 at [facility] per spouse; never heard anything else about it  . 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 Anemia    has had 3 units of prbcs 2013  . Anxiety   . Arthritis   . Asymptomatic gallstones    Ultrasound in 2006  . C. difficile colitis   . Cholelithiasis   . Chronic kidney disease    Dialysis M/W/F/Sa  . Depression   . Diabetes ([place])   . Duodenal ulcer with hemorrhage    per patient in 2006 or 2007, records have been requested  . Dyspnea    low oxygen level -  has O2 2 L all day  . 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 GERD (gastroesophageal reflux disease)   . Heart burn   . 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 HOH (hard of hearing)   . Hypertension   . Liver cirrhosis ([place])   . Splenomegaly    Ultrasound in 2006      Past Surgical History:  Procedure Laterality Date  . AGILE CAPSULE N/A 09/20/2019   Procedure: AGILE CAPSULE;  Surgeon: Jeffrey Dolin, MD;  [facility];  Service: Endoscopy;  Laterality: N/A;  . APPENDECTOMY    . AV FISTULA PLACEMENT Left 08/17/2019   Procedure: ARTERIOVENOUS (AV) FISTULA CREATION VERSES ARTERIOVENOUS GRAFT;  Surgeon: Jeffrey Posner, MD;  Location: [place];  Service: Vascular;  Laterality: Left;  . BASCILIC VEIN TRANSPOSITION Left 09/28/2019   Procedure: LEFT SECOND STAGE BASCILIC VEIN TRANSPOSITION;  Surgeon: Jeffrey Posner, MD;  Location: [place];  Service: Vascular;  Laterality: Left;  . BIOPSY  02/03/2018   Procedure: BIOPSY;  Surgeon: Jeffrey Dolin, MD;  [facility];  Service: Endoscopy;;  bx of gastric polyps  . BIOPSY  09/19/2019   Procedure: BIOPSY;  Surgeon: Jeffrey Binder, MD;  [facility];  Service: Endoscopy;;  . CATARACT EXTRACTION Right   . COLONOSCOPY  03/10/2005   Rectal polyp as described above, removed with snare. Left sided  diverticula. The remainder of the colonic mucosa appeared normal. Inflammed polyp on path.  . COLONOSCOPY  04/1999   Dr. Thea [name] polyps removed,  Path showed hyperplastic  . COLONOSCOPY  10/2015   Dr. Britta [name]: diverticulosis, single sessile tubular adenoma 3-31mm in size removed from descending colon.   . COLONOSCOPY WITH ESOPHAGOGASTRODUODENOSCOPY (EGD)  03/31/2012   BJY:NWGNFAO AVMs. Colonic diverticulosis. tubular adenoma colon  . COLONOSCOPY WITH ESOPHAGOGASTRODUODENOSCOPY (EGD)  06/2019   Jeffrey [name]: small esophageal varices without high risk stigmata, single large AVM on lesser curvature in gastric body s/p APC ablation, gastric antral and duodenal bulb polyposis. No  significant source to explain transfusion dependent anemia. Colonoscopy with portal colopathy and diffuse edema, changes of Cdiff colitis on colonoscopy  . ESOPHAGEAL BANDING N/A 02/03/2018   Procedure: ESOPHAGEAL BANDING;  Surgeon: Jeffrey Dolin, MD;  [facility];  Service: Endoscopy;  Laterality: N/A;  . ESOPHAGOGASTRODUODENOSCOPY  01/15/2005   Three columns grade 1 to 2 esophageal varices, otherwise normal esophageal mucosa.  Esophagus was not manipulated otherwise./Nodularity of the antrum  with overlying erosions, nonspecific finding. Path showed rare H.pylori  . ESOPHAGOGASTRODUODENOSCOPY  09/2008   Dr. Gaylene Brooks columns of grade 2-3 esoph varices, only one column was prominent. Portal gastropathy, multiple gastrc polyps at antrum, two were 2cm with black eschar, bulbar polyps, bulbar erosions  . ESOPHAGOGASTRODUODENOSCOPY  11/2004   Dr. Brantley Stage 3 esoph varices  . ESOPHAGOGASTRODUODENOSCOPY  03/31/2012   RMR: 4 columns(3-GR 2, 1-GR1) non-bleeding esophageal varices, portal gastropathy, small HH, early GAVE, multiple gastric polyps   . ESOPHAGOGASTRODUODENOSCOPY (EGD) WITH PROPOFOL N/A 02/03/2018   Dr. Gala Romney: Esophageal varices, 3 columns grade 1-2.  Portal hypertensive gastropathy.  Multiple gastric polyps, biopsy consistent with hyperplastic.  [name] ESOPHAGOGASTRODUODENOSCOPY (EGD) WITH PROPOFOL N/A 09/19/2019   Fields: grade I and II esophageal varcies, mild portal hypertensive gastropathy, moderate gastritis but no H. pylori, single hyperplastic gastric polyp removed, obvious source for melena/transfusion dependent anemia not identified, may be due to friable gastric and duodenal mucosa in the setting of portal hypertension  . GASTRIC VARICES BANDING  03/31/2012   Procedure: GASTRIC VARICES BANDING;  Surgeon: Jeffrey Dolin, MD;  [facility];  Service: Endoscopy;  Laterality: N/A;  . GIVENS CAPSULE STUDY N/A 09/21/2019   Poor study with a lot of debris  obstructing much of the view of the first approximate 3 hours out of 6 hours.  No obvious source of bleeding identified.  Sequela of bleeding and old blood seen in the form of"whisps" of blood, blood flecks mostly toward the end of the study.  . IR REMOVAL TUN CV CATH W/O FL  02/15/2020  . POLYPECTOMY  09/19/2019   Procedure: POLYPECTOMY;  Surgeon: Jeffrey Binder, MD;  [facility];  Service: Endoscopy;;  . UMBILICAL HERNIA REPAIR  2017   exploratory laparotomy, abdominal washout      Social History:      Social History   Tobacco Use  . Smoking status: Former Smoker    Packs/day: 2.00    Years: 25.00    Pack years: 50.00    Types: Cigarettes    Quit date: 01/28/1986    Years since quitting: 34.0  . Smokeless tobacco: Never Used  Substance Use Topics  . Alcohol use: No    Comment: quit in 05/2013       Family History :     Family History  Problem Relation Age of Onset  . Colon cancer Father 65       deceased age 68  . Breast cancer Sister        deceased  . Diabetes Sister   . Stroke Mother   . Healthy Son   . Healthy Daughter   . Lung cancer Neg Hx   . Ovarian cancer Neg Hx      Home Medications:   Prior to Admission medications   Medication Sig Start Date End Date Taking? Authorizing Provider  acetaminophen (TYLENOL) 500 MG tablet Take 500 mg by mouth every 6 (six) hours as needed for mild pain.   Yes [provider]  Ascorbic Acid (VITAMIN C) 1000 MG tablet Take 1,000 mg by mouth 2 (two) times daily.   Yes [provider]  diltiazem (CARDIZEM CD) 120 MG 24 hr capsule Take 1 capsule (120 mg total) by mouth daily. 02/10/20 02/09/21 Yes Emokpae, Courage, MD  furosemide (LASIX) 40 MG tablet Take 40 mg by mouth 2 (two) times daily. 12/29/19  Yes [provider]  gabapentin (NEURONTIN) 100 MG capsule TAKE 1 CAPSULE BY MOUTH AT BEDTIME  Patient taking differently: Take 100 mg by mouth at bedtime.  01/30/20  Yes Waynetta Sandy, MD  HUMALOG KWIKPEN 100 UNIT/ML KwikPen Inject 50 Units into the skin as needed (Sugar). Sliding scale 11/05/19  Yes [provider]  HYDROcodone-acetaminophen (NORCO/VICODIN) 5-325 MG tablet Take 1 tablet by mouth 2 (two) times daily as needed for moderate pain or severe pain.  01/16/20  Yes [provider]  hydrOXYzine (VISTARIL) 25 MG capsule Take 25 mg by mouth 3 (three) times daily as needed for itching.  01/23/20  Yes [provider]  insulin degludec (TRESIBA FLEXTOUCH) 200 UNIT/ML FlexTouch Pen Inject 30 Units into the skin at bedtime. When blood sugars are up Patient taking differently: Inject 120 Units into the skin at bedtime. When blood sugars are up 01/06/20  Yes Barton Dubois, MD  iron polysaccharides (NIFEREX) 150 MG capsule Take 150 mg by mouth at bedtime.    Yes [provider]  lactulose (CHRONULAC) 10 GM/15ML solution Take 45 mLs (30 g total) by mouth 3 (three) times daily. 01/06/20  Yes Barton Dubois, MD  metoprolol succinate (TOPROL-XL) 25 MG 24 hr tablet Take 1 tablet (25 mg total) by mouth daily. Take at bedtime  on Monday ,Wednesday and Friday due to dialaylisis Patient taking differently: Take 25 mg by mouth See admin instructions. Take 1 tablet twice a day, but take ONLY at bedtime on Monday ,Wednesday and Friday due to dialysis. 01/06/20  Yes Barton Dubois, MD  milk thistle 175 MG tablet Take 175 mg by mouth 3 (three) times daily.    Yes [provider]  mirtazapine (REMERON) 30 MG tablet Take 1 tablet (30 mg total) by mouth at bedtime. 02/10/20  Yes Roxan Hockey, MD  Multiple Vitamin (MULTIVITAMIN WITH MINERALS) TABS tablet Take 1 tablet by mouth in the morning.    Yes [provider]  nystatin (MYCOSTATIN/NYSTOP) powder Apply topically 3 (three) times daily. Into groin area and affected area 01/06/20  Yes Barton Dubois, MD  omega-3 acid ethyl esters (LOVAZA) 1 g capsule Take 2 g by mouth 2 (two) times daily.   04/07/19  Yes [provider]  omeprazole (PRILOSEC) 40 MG capsule Take 40 mg by mouth in the morning.    Yes [provider]  pravastatin (PRAVACHOL) 20 MG tablet Take 20 mg by mouth in the morning.    Yes [provider]  Probiotic Product (PROBIOTIC DAILY PO) Take 1 tablet by mouth in the morning.    Yes [provider]  rifaximin (XIFAXAN) 550 MG TABS tablet Take 1 tablet (550 mg total) by mouth 2 (two) times daily. 01/13/19  Yes Mahala Menghini, PA-C  spironolactone (ALDACTONE) 50 MG tablet Take 50 mg by mouth in the morning.    Yes [provider]  tamsulosin (FLOMAX) 0.4 MG CAPS capsule Take 0.4 mg by mouth every evening.   Yes [provider]  thiamine 100 MG tablet Take 100 mg by mouth in the morning. Vitamin B-1   Yes [provider]  tiZANidine (ZANAFLEX) 2 MG tablet Take 2 mg by mouth 2 (two) times daily. 01/31/20  Yes [provider]  triamcinolone cream (KENALOG) 0.1 % Apply 1 application topically 2 (two) times daily as needed (dry skin Itching).  12/30/17  Yes [provider]  venlafaxine (EFFEXOR) 75 MG tablet Take 75 mg by mouth 3 (three) times daily with meals.    Yes [provider]  vitamin B-12 (CYANOCOBALAMIN) 1000 MCG tablet Take 1,000 mcg  by mouth daily.   Yes [provider]  Alcohol Swabs (ALCOHOL PREP) 70 % PADS Apply 1 each topically 3 (three) times daily. 01/31/20   [provider]  NOVOFINE 32G X 6 MM MISC Inject into the skin. 01/31/20   [provider]     Allergies:     Allergies  Allergen Reactions  . Chlorhexidine   . Tape Itching and Rash     Physical Exam:   Vitals  Blood pressure 120/84, pulse 86, temperature 98.1 F (36.7 C), temperature source Oral, resp. rate 18, height 5\' 7"  (1.702 m), weight 104.3 kg, SpO2 100 %.  1.  General: Laying supine in bed with head of bed elevated, no acute distress  2. Psychiatric: Oriented x 3 Wakes to  voice Cooperative and pleasant  3. Neurologic: CN II-VII intact No focal deficit on limited exam  4. HEENMT:  Head with right eye hematoma Right submandibular hematoma Sclera clear Trachea midline Mucous membranes are moist  5. Respiratory : Crackles in the BL bases No wheeze Autopeep breathing Speaks in full sentences  6. Cardiovascular : HRRR No murmur, rub or gallop  7. Gastrointestinal:  Abdomen is soft, minimally distended, and non tender to palpation  8. Skin:  Bruising and skin tear right upper extremity  9.Musculoskeletal:  3+ pitting edema lower extremities BL    Data Review:    CBC No results for input(s): WBC, HGB, HCT, PLT, MCV, MCH, MCHC, RDW, LYMPHSABS, MONOABS, EOSABS, BASOSABS, BANDABS in the last 168 hours.  Invalid input(s): NEUTRABS, BANDSABD ------------------------------------------------------------------------------------------------------------------  Results for orders placed or performed during the hospital encounter of 02/19/20 (from the past 48 hour(s))  Basic metabolic panel     Status: Abnormal   Collection Time: 02/19/20  4:17 PM  Result Value Ref Range   Sodium 121 (L) 135 - 145 mmol/L   Potassium 4.5 3.5 - 5.1 mmol/L   Chloride 86 (L) 98 - 111 mmol/L   CO2 22 22 - 32 mmol/L   Glucose, Bld 145 (H) 70 - 99 mg/dL    Comment: Glucose reference range applies only to samples taken after fasting for at least 8 hours.   BUN 42 (H) 8 - 23 mg/dL   Creatinine, Ser 3.99 (H) 0.61 - 1.24 mg/dL   Calcium 9.0 8.9 - 10.3 mg/dL   GFR calc non Af Amer 14 (L) >60 mL/min   GFR calc Af Amer 16 (L) >60 mL/min   Anion gap 13 5 - 15    Comment: Performed at [facility], [address] [place], [place] [address]    Chemistries  Recent Labs  Lab 02/19/20 1617  NA 121*  K 4.5  CL 86*  CO2 22  GLUCOSE 145*  BUN 42*  CREATININE 3.99*  CALCIUM 9.0    ------------------------------------------------------------------------------------------------------------------  ------------------------------------------------------------------------------------------------------------------ GFR: Estimated Creatinine Clearance: 19 mL/min (A) (by C-G formula based on SCr of 3.99 mg/dL (H)). Liver Function Tests: No results for input(s): AST, ALT, ALKPHOS, BILITOT, PROT, ALBUMIN in the last 168 hours. No results for input(s): LIPASE, AMYLASE in the last 168 hours. No results for input(s): AMMONIA in the last 168 hours. Coagulation Profile: No results for input(s): INR, PROTIME in the last 168 hours. Cardiac Enzymes: No results for input(s): CKTOTAL, CKMB, CKMBINDEX, TROPONINI in the last 168 hours. BNP (last 3 results) No results for input(s): PROBNP in the last 8760 hours. HbA1C: No results for input(s): HGBA1C in the last 72 hours. CBG: No results for input(s): GLUCAP in the  last 168 hours. Lipid Profile: No results for input(s): CHOL, HDL, LDLCALC, TRIG, CHOLHDL, LDLDIRECT in the last 72 hours. Thyroid Function Tests: No results for input(s): TSH, T4TOTAL, FREET4, T3FREE, THYROIDAB in the last 72 hours. Anemia Panel: No results for input(s): VITAMINB12, FOLATE, FERRITIN, TIBC, IRON, RETICCTPCT in the last 72 hours.  --------------------------------------------------------------------------------------------------------------- Urine analysis:    Component Value Date/Time   COLORURINE AMBER (A) 01/04/2020 1259   APPEARANCEUR CLOUDY (A) 01/04/2020 1259   LABSPEC 1.016 01/04/2020 1259   PHURINE 5.0 01/04/2020 1259   GLUCOSEU NEGATIVE 01/04/2020 1259   HGBUR LARGE (A) 01/04/2020 1259   BILIRUBINUR NEGATIVE 01/04/2020 1259   KETONESUR NEGATIVE 01/04/2020 1259   PROTEINUR 30 (A) 01/04/2020 1259   NITRITE NEGATIVE 01/04/2020 1259   LEUKOCYTESUR LARGE (A) 01/04/2020 1259      Imaging Results:    CT Head Wo Contrast  Result Date:  02/19/2020 CLINICAL DATA:  Acute pain due to trauma. Bruise to the right eye. Laceration to the chin. EXAM: CT HEAD WITHOUT CONTRAST TECHNIQUE: Contiguous axial images were obtained from the base of the skull through the vertex without intravenous contrast. COMPARISON:  12/13/2019 FINDINGS: Brain: No hemorrhage. No extraaxial collection.No midline shift. There is atrophy.The basal cisterns are unremarkable. Patchy and confluent areas of decreased attenuation are noted throughout the deep and periventricular white matter of the cerebral hemispheres bilaterally, compatible with chronic microvascular ischemic disease. There is an old infarct in the left cerebellum. The brainstem is unremarkable. The cerebellum is unremarkable. The sella is unremarkable. Vascular: No hyperdense vessel or unexpected calcification. Skull: The calvarium is unremarkable. The skull base is unremarkable. The visualized upper cervical spine is unremarkable. Sinuses/Orbits: The visualized orbits are unremarkable. The paranasal sinuses are unremarkable. The mastoid air cells are clear. Other: The visualized parotid gland is unremarkable. There is mild right-sided periorbital soft tissue swelling. IMPRESSION: 1. No acute intracranial abnormality. 2. Atrophy and chronic microvascular ischemic disease. 3. Old left cerebellar infarct. Electronically Signed   By: Constance Holster M.D.   On: 02/19/2020 15:19       Assessment & Plan:    Active Problems:   * No active hospital problems. *   1. Metabolic encephalopathy 1. Likely secondary to electrolyte abnormality 2. CT head shows no acute abnormalities 3. EtOH, tylenol and UDS levels pending  4. Ammonia level pending 5. Dialysis will correct hyponatremia 6. Continue to monitor 2. ESRD 1. Missed dialysis today 2. Cr elevated from 2.5 to 4 3. BUN elevated from19 to 42 4. K+ 4.5 5. Na 121, down from 127 at discharge 9/11 6. Nephro consulted for routine dialysis  tomorrow 3. Hyponatremia 1. Na 121 2. Down from 127 at discharge 9/11 after a hospitalization for hyponatremia 3. Patient missed dialysis today 4. Consult nephro for dialysis tomorrow 4. Fall 1. Consult PT 2. Correct electrolyte abnormalities 3. 2/2 metabolic encephalopathy and generalized weakness 4. See treatment plan above 5. HTN 1. Continue home medications 6. Afib 1. Continue home medications 7. EtOH cirrhosis 1. Last drink 5 years ago 2. Continue rifaximin, and lactulose 3. Monitor CMP   DVT Prophylaxis-   heparin - SCDs   AM Labs Ordered, also please review Full Orders  Family Communication: No family at bedside Code Status:  Full  Admission status: Observation    Time spent in minutes : [name] DO

## 2020-02-19 NOTE — ED Triage Notes (Signed)
Patient was dreaming and fell out of bed, striking head on nightstand, bruise to right eye, and laceration to chin.  Per wife, every time patient fall and striking head, he needs to come to ED for CT of head, to clear him for dialysis.  Was schedule for dialysis today.

## 2020-02-19 NOTE — ED Provider Notes (Signed)
[name] EMERGENCY DEPARTMENT Provider Note   CSN: [id number] Arrival date & time: 02/19/20  4259     History Chief Complaint  Patient presents with  . Fall    Jeffrey [name] is a 74 y.o. male with a history as outlined below, most significant for ESRD on dialysis (missing todays tx currently), alchoholic cirrhosis, DM and HTN presenting for evaluation of head injury and skin abrasions.  He reports dreaming when he rolled over and fell out of bed around 7 am today, striking his right forehead/brow region on the edge of the nightstand.  He also sustained several small abrasions of his upper extremities.  He denies n/v , headache, dizziness or any other complaints with todays injury.  He does endorse persistent weakness since his recent hospitalization but not worsened weakness.  (dc'd 9/11 - hyponatremia and exacerbation of his cirrhosis).  He knows he needs a CT of his head prior to being allowed back at dialysis, hence his presentation.  He is not aware of his tetanus status.   The history is provided by the patient and medical records.       Past Medical History:  Diagnosis Date  . A-fib [facility])    when initially stating dialysis january 2021 at [facility] per spouse; never heard anything else about it  . Alcoholic cirrhosis ([place])    patient reports completing hep a and b vaccines in 2006  . Anemia    has had 3 units of prbcs 2013  . Anxiety   . Arthritis   . Asymptomatic gallstones    Ultrasound in 2006  . C. difficile colitis   . Cholelithiasis   . Chronic kidney disease    Dialysis M/W/F/Sa  . Depression   . Diabetes ([place])   . Duodenal ulcer with hemorrhage    per patient in 2006 or 2007, records have been requested  . Dyspnea    low oxygen level -  has O2 2 L all day  . 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 GERD (gastroesophageal reflux disease)   . Heart burn   . 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 HOH (hard of hearing)   . Hypertension   .  Liver cirrhosis ([place])   . Splenomegaly    Ultrasound in 2006    Patient Active Problem List   Diagnosis Date Noted  . Fever and chills 02/09/2020  . Advanced care planning/counseling discussion   . DNR (do not resuscitate)   . Alcoholic cirrhosis of liver ([place]) 02/08/2020  . Back pain 02/08/2020  . Status post thoracentesis   . Hepatic encephalopathy ([place]) 01/01/2020  . Bilateral pleural effusion 12/31/2019  . Hypoglycemia 12/31/2019  . Hyperammonemia ([place]) 12/31/2019  . Obesity (BMI 30-39.9) 12/31/2019  . Hypoalbuminemia 12/31/2019  . Nosebleed   . Goals of care, counseling/discussion   . Palliative care by specialist   . Acute hepatic encephalopathy 12/13/2019  . Encephalopathy acute   . Hyperkalemia   . Hyponatremia   . Anemia 11/16/2019  . Acute blood loss anemia 09/20/2019  . Gastrointestinal hemorrhage   . Acute on chronic anemia 09/18/2019  . Generalized weakness 09/18/2019  . Type 2 diabetes mellitus ([place]) 09/18/2019  . 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[place]) 09/18/2019  . Enteritis due to Clostridium difficile 08/31/2019  . Edema 04/11/2019  . Alcoholic cirrhosis ([place]) 56/38/7564  . Type 2 diabetes mellitus with stage 3 chronic kidney disease, with long-term current use of insulin (  [place]) 10/20/2017  . Essential hypertension, benign 10/20/2017  . Morbid obesity ([place]) 10/20/2017  . Mixed hyperlipidemia 10/20/2017  . Ascites   . SBP (spontaneous bacterial peritonitis) ([place]) 01/12/2016  . Shigella dysenteriae 01/11/2016  . Diarrhea 01/10/2016  . Cirrhosis ([place]) 09/05/2012  . Alcoholic cirrhosis of liver with ascites ([place]) 03/09/2012  . Heme positive stool 03/09/2012  . Anemia due to chronic blood loss 03/09/2012  . Pancytopenia ([place]) 03/09/2012  . FH: colon cancer 03/09/2012  . Esophageal varices in alcoholic cirrhosis ([place]) 43/15/4008    Past Surgical History:  Procedure Laterality Date  . AGILE CAPSULE N/A 09/20/2019   Procedure: AGILE CAPSULE;  Surgeon:  Daneil Dolin, MD;  [facility];  Service: Endoscopy;  Laterality: N/A;  . APPENDECTOMY    . AV FISTULA PLACEMENT Left 08/17/2019   Procedure: ARTERIOVENOUS (AV) FISTULA CREATION VERSES ARTERIOVENOUS GRAFT;  Surgeon: [name], MD;  Location: [place];  Service: Vascular;  Laterality: Left;  . BASCILIC VEIN TRANSPOSITION Left 09/28/2019   Procedure: LEFT SECOND STAGE BASCILIC VEIN TRANSPOSITION;  Surgeon: [name], MD;  Location: [place];  Service: Vascular;  Laterality: Left;  . BIOPSY  02/03/2018   Procedure: BIOPSY;  Surgeon: Daneil Dolin, MD;  [facility];  Service: Endoscopy;;  bx of gastric polyps  . BIOPSY  09/19/2019   Procedure: BIOPSY;  Surgeon: Danie Binder, MD;  [facility];  Service: Endoscopy;;  . CATARACT EXTRACTION Right   . COLONOSCOPY  03/10/2005   Rectal polyp as described above, removed with snare. Left sided  diverticula. The remainder of the colonic mucosa appeared normal. Inflammed polyp on path.  . COLONOSCOPY  04/1999   Dr. Thea Silversmith polyps removed,  Path showed hyperplastic  . COLONOSCOPY  10/2015   Dr. Britta Mccreedy: diverticulosis, single sessile tubular adenoma 3-26mm in size removed from descending colon.   . COLONOSCOPY WITH ESOPHAGOGASTRODUODENOSCOPY (EGD)  03/31/2012   QPY:[id number] AVMs. Colonic diverticulosis. tubular adenoma colon  . COLONOSCOPY WITH ESOPHAGOGASTRODUODENOSCOPY (EGD)  06/2019   FORSYTH: small esophageal varices without high risk stigmata, single large AVM on lesser curvature in gastric body s/p APC ablation, gastric antral and duodenal bulb polyposis. No significant source to explain transfusion dependent anemia. Colonoscopy with portal colopathy and diffuse edema, changes of Cdiff colitis on colonoscopy  . ESOPHAGEAL BANDING N/A 02/03/2018   Procedure: ESOPHAGEAL BANDING;  Surgeon: Daneil Dolin, MD;  [facility];  Service: Endoscopy;  Laterality: N/A;  . ESOPHAGOGASTRODUODENOSCOPY  01/15/2005    Three columns grade 1 to 2 esophageal varices, otherwise normal esophageal mucosa.  Esophagus was not manipulated otherwise./Nodularity of the antrum with overlying erosions, nonspecific finding. Path showed rare H.pylori  . ESOPHAGOGASTRODUODENOSCOPY  09/2008   Dr. Gaylene Brooks columns of grade 2-3 esoph varices, only one column was prominent. Portal gastropathy, multiple gastrc polyps at antrum, two were 2cm with black eschar, bulbar polyps, bulbar erosions  . ESOPHAGOGASTRODUODENOSCOPY  11/2004   Dr. Brantley Stage 3 esoph varices  . ESOPHAGOGASTRODUODENOSCOPY  03/31/2012   RMR: 4 columns(3-GR 2, 1-GR1) non-bleeding esophageal varices, portal gastropathy, small HH, early GAVE, multiple gastric polyps   . ESOPHAGOGASTRODUODENOSCOPY (EGD) WITH PROPOFOL N/A 02/03/2018   Dr. Gala Romney: Esophageal varices, 3 columns grade 1-2.  Portal hypertensive gastropathy.  Multiple gastric polyps, biopsy consistent with hyperplastic.  [name] ESOPHAGOGASTRODUODENOSCOPY (EGD) WITH PROPOFOL N/A 09/19/2019   Fields: grade I and II esophageal varcies, mild portal hypertensive gastropathy, moderate gastritis but no H. pylori, single hyperplastic gastric polyp removed,  obvious source for melena/transfusion dependent anemia not identified, may be due to friable gastric and duodenal mucosa in the setting of portal hypertension  . GASTRIC VARICES BANDING  03/31/2012   Procedure: GASTRIC VARICES BANDING;  Surgeon: Daneil Dolin, MD;  [facility];  Service: Endoscopy;  Laterality: N/A;  . GIVENS CAPSULE STUDY N/A 09/21/2019   Poor study with a lot of debris obstructing much of the view of the first approximate 3 hours out of 6 hours.  No obvious source of bleeding identified.  Sequela of bleeding and old blood seen in the form of"whisps" of blood, blood flecks mostly toward the end of the study.  . IR REMOVAL TUN CV CATH W/O FL  02/15/2020  . POLYPECTOMY  09/19/2019   Procedure: POLYPECTOMY;  Surgeon: Danie Binder, MD;   [facility];  Service: Endoscopy;;  . UMBILICAL HERNIA REPAIR  2017   exploratory laparotomy, abdominal washout       Family History  Problem Relation Age of Onset  . Colon cancer Father 55       deceased age 102  . Breast cancer Sister        deceased  . Diabetes Sister   . Stroke Mother   . Healthy Son   . Healthy Daughter   . Lung cancer Neg Hx   . Ovarian cancer Neg Hx     Social History   Tobacco Use  . Smoking status: Former Smoker    Packs/day: 2.00    Years: 25.00    Pack years: 50.00    Types: Cigarettes    Quit date: 01/28/1986    Years since quitting: 34.0  . Smokeless tobacco: Never Used  Vaping Use  . Vaping Use: Never used  Substance Use Topics  . Alcohol use: No    Comment: quit in 05/2013  . Drug use: No    Home Medications Prior to Admission medications   Medication Sig Start Date End Date Taking? Authorizing Provider  acetaminophen (TYLENOL) 500 MG tablet Take 500 mg by mouth every 6 (six) hours as needed for mild pain.   Yes [provider]  Ascorbic Acid (VITAMIN C) 1000 MG tablet Take 1,000 mg by mouth 2 (two) times daily.   Yes [provider]  diltiazem (CARDIZEM CD) 120 MG 24 hr capsule Take 1 capsule (120 mg total) by mouth daily. 02/10/20 02/09/21 Yes Emokpae, Courage, MD  furosemide (LASIX) 40 MG tablet Take 40 mg by mouth 2 (two) times daily. 12/29/19  Yes [provider]  gabapentin (NEURONTIN) 100 MG capsule TAKE 1 CAPSULE BY MOUTH AT BEDTIME Patient taking differently: Take 100 mg by mouth at bedtime.  01/30/20  Yes Waynetta Sandy, MD  HUMALOG KWIKPEN 100 UNIT/ML KwikPen Inject 50 Units into the skin as needed (Sugar). Sliding scale 11/05/19  Yes [provider]  HYDROcodone-acetaminophen (NORCO/VICODIN) 5-325 MG tablet Take 1 tablet by mouth 2 (two) times daily as needed for moderate pain or severe pain.  01/16/20  Yes [provider]  hydrOXYzine (VISTARIL) 25 MG capsule  Take 25 mg by mouth 3 (three) times daily as needed for itching.  01/23/20  Yes [provider]  insulin degludec (TRESIBA FLEXTOUCH) 200 UNIT/ML FlexTouch Pen Inject 30 Units into the skin at bedtime. When blood sugars are up Patient taking differently: Inject 120 Units into the skin at bedtime. When blood sugars are up 01/06/20  Yes Barton Dubois, MD  iron polysaccharides (NIFEREX) 150 MG capsule Take  150 mg by mouth at bedtime.    Yes [provider]  lactulose (CHRONULAC) 10 GM/15ML solution Take 45 mLs (30 g total) by mouth 3 (three) times daily. 01/06/20  Yes Barton Dubois, MD  metoprolol succinate (TOPROL-XL) 25 MG 24 hr tablet Take 1 tablet (25 mg total) by mouth daily. Take at bedtime  on Monday ,Wednesday and Friday due to dialaylisis Patient taking differently: Take 25 mg by mouth See admin instructions. Take 1 tablet twice a day, but take ONLY at bedtime on Monday ,Wednesday and Friday due to dialysis. 01/06/20  Yes Barton Dubois, MD  milk thistle 175 MG tablet Take 175 mg by mouth 3 (three) times daily.    Yes [provider]  mirtazapine (REMERON) 30 MG tablet Take 1 tablet (30 mg total) by mouth at bedtime. 02/10/20  Yes Roxan Hockey, MD  Multiple Vitamin (MULTIVITAMIN WITH MINERALS) TABS tablet Take 1 tablet by mouth in the morning.    Yes [provider]  nystatin (MYCOSTATIN/NYSTOP) powder Apply topically 3 (three) times daily. Into groin area and affected area 01/06/20  Yes Barton Dubois, MD  omega-3 acid ethyl esters (LOVAZA) 1 g capsule Take 2 g by mouth 2 (two) times daily.  04/07/19  Yes [provider]  omeprazole (PRILOSEC) 40 MG capsule Take 40 mg by mouth in the morning.    Yes [provider]  pravastatin (PRAVACHOL) 20 MG tablet Take 20 mg by mouth in the morning.    Yes [provider]  Probiotic Product (PROBIOTIC DAILY PO) Take 1 tablet by mouth in the morning.    Yes [provider]  rifaximin  (XIFAXAN) 550 MG TABS tablet Take 1 tablet (550 mg total) by mouth 2 (two) times daily. 01/13/19  Yes Mahala Menghini, PA-C  spironolactone (ALDACTONE) 50 MG tablet Take 50 mg by mouth in the morning.    Yes [provider]  tamsulosin (FLOMAX) 0.4 MG CAPS capsule Take 0.4 mg by mouth every evening.   Yes [provider]  thiamine 100 MG tablet Take 100 mg by mouth in the morning. Vitamin B-1   Yes [provider]  tiZANidine (ZANAFLEX) 2 MG tablet Take 2 mg by mouth 2 (two) times daily. 01/31/20  Yes [provider]  triamcinolone cream (KENALOG) 0.1 % Apply 1 application topically 2 (two) times daily as needed (dry skin Itching).  12/30/17  Yes [provider]  venlafaxine (EFFEXOR) 75 MG tablet Take 75 mg by mouth 3 (three) times daily with meals.    Yes [provider]  vitamin B-12 (CYANOCOBALAMIN) 1000 MCG tablet Take 1,000 mcg by mouth daily.   Yes [provider]  Alcohol Swabs (ALCOHOL PREP) 70 % PADS Apply 1 each topically 3 (three) times daily. 01/31/20   [provider]  NOVOFINE 32G X 6 MM MISC Inject into the skin. 01/31/20   [provider]    Allergies    Chlorhexidine and Tape  Review of Systems   Review of Systems  Constitutional: Positive for fatigue. Negative for chills and fever.  HENT: Negative for congestion and sore throat.   Eyes: Negative.   Respiratory: Positive for shortness of breath. Negative for chest tightness.        Baseline sob  Cardiovascular: Positive for leg swelling. Negative for chest pain and palpitations.  Gastrointestinal: Negative for abdominal pain and nausea.  Genitourinary: Negative.   Musculoskeletal: Negative for arthralgias, joint swelling and neck pain.  Skin: Positive for color change  and wound. Negative for rash.  Neurological: Negative for dizziness, weakness, light-headedness, numbness and headaches.  Psychiatric/Behavioral: Negative.     Physical Exam Updated  Vital Signs BP 120/84 (BP Location: Left Arm)   Pulse 86   Temp 98.1 F (36.7 C) (Oral)   Resp 18   Ht 5\' 7"  (1.702 m)   Wt 104.3 kg   SpO2 100%   BMI 36.02 kg/m   Physical Exam Vitals and nursing note reviewed.  Constitutional:      Appearance: He is well-developed.  HENT:     Head: Normocephalic. No raccoon eyes or Battle's sign.     Jaw: There is normal jaw occlusion. No tenderness or pain on movement.     Comments: Small sealed scab at wound noted right mandible.  Small contusion right brow/upper eyelid. Eyes:     Extraocular Movements: Extraocular movements intact.     Right eye: Normal extraocular motion.     Left eye: Normal extraocular motion.     Conjunctiva/sclera: Conjunctivae normal.     Right eye: Right conjunctiva is not injected. No chemosis.    Comments: Small ecchymosis right upper lid.  Sclera without trauma, denies eye pain or vision changes.   Cardiovascular:     Rate and Rhythm: Normal rate and regular rhythm.     Heart sounds: Normal heart sounds.  Pulmonary:     Effort: Pulmonary effort is normal.     Breath sounds: Rales present. No wheezing.     Comments: Rales bilateral bases Abdominal:     General: Abdomen is protuberant. Bowel sounds are normal.     Palpations: Abdomen is soft.     Tenderness: There is no abdominal tenderness.  Musculoskeletal:        General: Normal range of motion.     Cervical back: Normal range of motion.     Right lower leg: Edema present.     Left lower leg: Edema present.  Skin:    General: Skin is warm and dry.     Comments: Several small areas of bruising bilateral hands and forearms. Superficial skin tears 1 cm or less right elbow, left dorsal wrist and hand.   Neurological:     General: No focal deficit present.     Mental Status: He is alert.     ED Results / Procedures / Treatments   Labs (all labs ordered are listed, but only abnormal results are displayed) Labs Reviewed  BASIC METABOLIC PANEL -  Abnormal; Notable for the following components:      Result Value   Sodium 121 (*)    Chloride 86 (*)    Glucose, Bld 145 (*)    BUN 42 (*)    Creatinine, Ser 3.99 (*)    GFR calc non Af Amer 14 (*)    GFR calc Af Amer 16 (*)    All other components within normal limits  SARS CORONAVIRUS 2 BY RT PCR (HOSPITAL ORDER, [facility])  CBC WITH DIFFERENTIAL/PLATELET  AMMONIA    EKG None  Radiology CT Head Wo Contrast  Result Date: 02/19/2020 CLINICAL DATA:  Acute pain due to trauma. Bruise to the right eye. Laceration to the chin. EXAM: CT HEAD WITHOUT CONTRAST TECHNIQUE: Contiguous axial images were obtained from the base of the skull through the vertex without intravenous contrast. COMPARISON:  12/13/2019 FINDINGS: Brain: No hemorrhage. No extraaxial collection.No midline shift. There is atrophy.The basal cisterns are unremarkable. Patchy and confluent areas of decreased attenuation are  noted throughout the deep and periventricular white matter of the cerebral hemispheres bilaterally, compatible with chronic microvascular ischemic disease. There is an old infarct in the left cerebellum. The brainstem is unremarkable. The cerebellum is unremarkable. The sella is unremarkable. Vascular: No hyperdense vessel or unexpected calcification. Skull: The calvarium is unremarkable. The skull base is unremarkable. The visualized upper cervical spine is unremarkable. Sinuses/Orbits: The visualized orbits are unremarkable. The paranasal sinuses are unremarkable. The mastoid air cells are clear. Other: The visualized parotid gland is unremarkable. There is mild right-sided periorbital soft tissue swelling. IMPRESSION: 1. No acute intracranial abnormality. 2. Atrophy and chronic microvascular ischemic disease. 3. Old left cerebellar infarct. Electronically Signed   By: Constance Holster M.D.   On: 02/19/2020 15:19    Procedures Procedures (including critical care time)  Medications  Ordered in ED Medications  Tdap (BOOSTRIX) injection 0.5 mL (0.5 mLs Intramuscular Given 02/19/20 1557)    ED Course  I have reviewed the triage vital signs and the nursing notes.  Pertinent labs & imaging results that were available during my care of the patient were reviewed by me and considered in my medical decision making (see chart for details).    MDM Rules/Calculators/A&P                          Tetanus updated after review of chart, no prior updates x 10 years here.  Wound care provided for superficial skin tears.  Labs to assess for need of emergent dialysis, CT head to r/o intracranial trauma.   Ct head negative for acute intracranial  injury.  Labs with significant hyponatremia.  Review of chart indicating this is a chronic finding.  DC'd from here on 9/11 at which time his Na was 127, now 121.  He was advised low salt and fluid restriction during this recent hospitalization.    Call placed with nephrology for consult.  Discussed with Dr. Moshe Cipro with renal - ok with dc home with Na of 121 as long as he can get dialyzed tomorrow.  Discussed with pt and wife at bedside, can call in am to try to reschedule. However, during conversation, pt also endorses increased sob since arriving here, especially when supine. Having difficulty sitting upright due to chronic back pain.   He also has liver cirrhosis - at recent hospitalization no appreciable ascites, also confusion but wife reports he has slept most of today.  Additional labs including cbc ammonia level, portable chest xray added, Covid screening in anticipation of admission with plans for dialysis here tomorrow to ensure he gets this tx. He is also scheduled for an iron infusion tomorrow with the Hem/onc clinic here tomorrow, could also get this infusion here.   Discussed with Dr. Laverta Baltimore who will call for admission once additional labs resulted.   Also spoke with Dr. Clearence Ped. Requests call back when labs resulted. Final  Clinical Impression(s) / ED Diagnoses Final diagnoses:  Fall, initial encounter  Minor head injury, initial encounter  Chronic hyponatremia    Rx / DC Orders ED Discharge Orders    None       Landis Martins 02/19/20 1933    Long, Wonda [facility], MD 02/20/20 0040

## 2020-02-19 NOTE — ED Notes (Signed)
Pts O2 fell to 89% on RA while sleeping, placed on 2 L per [place]

## 2020-02-19 NOTE — ED Notes (Signed)
Last vitals taken are 10:42. 12:08 vitals are an error.

## 2020-02-20 ENCOUNTER — Ambulatory Visit (HOSPITAL_COMMUNITY): Payer: Medicare Other

## 2020-02-20 DIAGNOSIS — N186 End stage renal disease: Secondary | ICD-10-CM | POA: Diagnosis not present

## 2020-02-20 DIAGNOSIS — I5032 Chronic diastolic (congestive) heart failure: Secondary | ICD-10-CM | POA: Diagnosis not present

## 2020-02-20 DIAGNOSIS — K769 Liver disease, unspecified: Secondary | ICD-10-CM

## 2020-02-20 DIAGNOSIS — D631 Anemia in chronic kidney disease: Secondary | ICD-10-CM | POA: Diagnosis not present

## 2020-02-20 DIAGNOSIS — K7460 Unspecified cirrhosis of liver: Secondary | ICD-10-CM

## 2020-02-20 DIAGNOSIS — R5381 Other malaise: Secondary | ICD-10-CM | POA: Insufficient documentation

## 2020-02-20 DIAGNOSIS — S0990XA Unspecified injury of head, initial encounter: Secondary | ICD-10-CM | POA: Diagnosis not present

## 2020-02-20 DIAGNOSIS — E871 Hypo-osmolality and hyponatremia: Secondary | ICD-10-CM | POA: Diagnosis not present

## 2020-02-20 DIAGNOSIS — W19XXXA Unspecified fall, initial encounter: Secondary | ICD-10-CM | POA: Insufficient documentation

## 2020-02-20 DIAGNOSIS — Z992 Dependence on renal dialysis: Secondary | ICD-10-CM | POA: Diagnosis not present

## 2020-02-20 DIAGNOSIS — I120 Hypertensive chronic kidney disease with stage 5 chronic kidney disease or end stage renal disease: Secondary | ICD-10-CM | POA: Diagnosis not present

## 2020-02-20 LAB — URINALYSIS, ROUTINE W REFLEX MICROSCOPIC
Bilirubin Urine: NEGATIVE
Glucose, UA: NEGATIVE mg/dL
Hgb urine dipstick: NEGATIVE
Ketones, ur: NEGATIVE mg/dL
Nitrite: NEGATIVE
Protein, ur: NEGATIVE mg/dL
Specific Gravity, Urine: 1.009 (ref 1.005–1.030)
pH: 5 (ref 5.0–8.0)

## 2020-02-20 LAB — COMPREHENSIVE METABOLIC PANEL
ALT: 24 U/L (ref 0–44)
AST: 45 U/L — ABNORMAL HIGH (ref 15–41)
Albumin: 2.6 g/dL — ABNORMAL LOW (ref 3.5–5.0)
Alkaline Phosphatase: 232 U/L — ABNORMAL HIGH (ref 38–126)
Anion gap: 12 (ref 5–15)
BUN: 48 mg/dL — ABNORMAL HIGH (ref 8–23)
CO2: 22 mmol/L (ref 22–32)
Calcium: 8.7 mg/dL — ABNORMAL LOW (ref 8.9–10.3)
Chloride: 88 mmol/L — ABNORMAL LOW (ref 98–111)
Creatinine, Ser: 4.31 mg/dL — ABNORMAL HIGH (ref 0.61–1.24)
GFR calc Af Amer: 15 mL/min — ABNORMAL LOW (ref >60)
GFR calc non Af Amer: 13 mL/min — ABNORMAL LOW (ref >60)
Glucose, Bld: 144 mg/dL — ABNORMAL HIGH (ref 70–99)
Potassium: 4.9 mmol/L (ref 3.5–5.1)
Sodium: 122 mmol/L — ABNORMAL LOW (ref 135–145)
Total Bilirubin: 1.3 mg/dL — ABNORMAL HIGH (ref 0.3–1.2)
Total Protein: 7.2 g/dL (ref 6.5–8.1)

## 2020-02-20 LAB — CBC WITH DIFFERENTIAL/PLATELET
Abs Immature Granulocytes: 0.03 10*3/uL (ref 0.00–0.07)
Basophils Absolute: 0 10*3/uL (ref 0.0–0.1)
Basophils Relative: 0 %
Eosinophils Absolute: 0.1 10*3/uL (ref 0.0–0.5)
Eosinophils Relative: 2 %
HCT: 26.2 % — ABNORMAL LOW (ref 39.0–52.0)
Hemoglobin: 8.2 g/dL — ABNORMAL LOW (ref 13.0–17.0)
Immature Granulocytes: 1 %
Lymphocytes Relative: 4 %
Lymphs Abs: 0.2 10*3/uL — ABNORMAL LOW (ref 0.7–4.0)
MCH: 29.9 pg (ref 26.0–34.0)
MCHC: 31.3 g/dL (ref 30.0–36.0)
MCV: 95.6 fL (ref 80.0–100.0)
Monocytes Absolute: 0.3 10*3/uL (ref 0.1–1.0)
Monocytes Relative: 5 %
Neutro Abs: 5.3 10*3/uL (ref 1.7–7.7)
Neutrophils Relative %: 88 %
Platelets: 165 10*3/uL (ref 150–400)
RBC: 2.74 MIL/uL — ABNORMAL LOW (ref 4.22–5.81)
RDW: 18.2 % — ABNORMAL HIGH (ref 11.5–15.5)
WBC: 6.1 10*3/uL (ref 4.0–10.5)
nRBC: 0 % (ref 0.0–0.2)

## 2020-02-20 LAB — MAGNESIUM: Magnesium: 1.9 mg/dL (ref 1.7–2.4)

## 2020-02-20 MED ORDER — PENTAFLUOROPROP-TETRAFLUOROETH EX AERO: 1.0000 "application " | INHALATION_SPRAY | CUTANEOUS | Status: DC | PRN

## 2020-02-20 MED ORDER — TRESIBA FLEXTOUCH 200 UNIT/ML ~~LOC~~ SOPN: 120.0000 [IU] | PEN_INJECTOR | Freq: Every day | SUBCUTANEOUS | Status: DC

## 2020-02-20 MED ORDER — SODIUM CHLORIDE 0.9 % IV SOLN
510.0000 mg | Freq: Once | INTRAVENOUS | Status: AC
  Administered 2020-02-20: 510 mg via INTRAVENOUS
  Filled 2020-02-20: qty 510

## 2020-02-20 MED ORDER — LIDOCAINE-PRILOCAINE 2.5-2.5 % EX CREA: 1.0000 "application " | TOPICAL_CREAM | CUTANEOUS | Status: DC | PRN

## 2020-02-20 MED ORDER — SODIUM CHLORIDE 0.9 % IV SOLN: 100.0000 mL | INTRAVENOUS | Status: DC | PRN

## 2020-02-20 MED ORDER — EPOETIN ALFA-EPBX 10000 UNIT/ML IJ SOLN
10000.0000 [IU] | INTRAMUSCULAR | Status: DC | PRN
  Filled 2020-02-20: qty 1

## 2020-02-20 MED ORDER — LIDOCAINE HCL (PF) 1 % IJ SOLN: 5.0000 mL | INTRAMUSCULAR | Status: DC | PRN

## 2020-02-20 NOTE — Care Management Obs Status (Signed)
[facility] NOTIFICATION   Patient Details  Name: Jeffrey [name] MRN: [id number] Date of Birth: Feb 22, 1946   Medicare Observation Status Notification Given:  Yes (mailed to [address] [place], [place] [address])    [name] 02/20/2020, 4:17 PM

## 2020-02-20 NOTE — Procedures (Signed)
     HEMODIALYSIS TREATMENT NOTE:  HD performed in emergency department with goal of discharging patient home after treatment.  4 hour session completed via LUE AVF (15g/antegrade).  Goal met: 3.8 liters removed without interruption in UF.  Feraheme 510mg given during last hour of session (was [facility]).  All blood was returned and hemostasis was achieved in 15 minutes.  No changes from pre-dialysis assessment.   , RN 

## 2020-02-20 NOTE — Discharge Instructions (Signed)

## 2020-02-20 NOTE — Plan of Care (Signed)
  Problem: Acute Rehab PT Goals(only PT should resolve) Goal: Patient Will Perform Sitting Balance 02/20/2020 1523 by Lonell Grandchild, PT Flowsheets (Taken 02/20/2020 1523) Patient will perform sitting balance: Independently 02/20/2020 1522 by Lonell Grandchild, PT Outcome: Progressing 02/20/2020 1521 by Lonell Grandchild, PT Outcome: Progressing Goal: Patient Will Transfer Sit To/From Stand 02/20/2020 1522 by Lonell Grandchild, PT Outcome: Progressing 02/20/2020 1521 by Lonell Grandchild, PT Outcome: Progressing Goal: Pt Will Ambulate 02/20/2020 1523 by Lonell Grandchild, PT Flowsheets (Taken 02/20/2020 1523) Pt will Ambulate:  > 125 feet  with modified independence  with rolling walker 02/20/2020 1522 by Lonell Grandchild, PT Outcome: Progressing 02/20/2020 1521 by Lonell Grandchild, PT Outcome: Progressing   Problem: Acute Rehab PT Goals(only PT should resolve) Goal: Pt Will Transfer Bed To Chair/Chair To Bed 02/20/2020 1523 by Lonell Grandchild, PT Flowsheets (Taken 02/20/2020 1523) Pt will Transfer Bed to Chair/Chair to Bed: with modified independence 02/20/2020 1522 by Lonell Grandchild, PT Outcome: Progressing Flowsheets (Taken 02/20/2020 1522) Pt will Transfer Bed to Chair/Chair to Bed: with modified independence   3:23 PM, 02/20/20 Lonell Grandchild, MPT Physical Therapist with [facility] 336 [phone] office [phone] mobile phone

## 2020-02-20 NOTE — Evaluation (Signed)
Physical Therapy Evaluation Patient Details Name: Jeffrey [name] MRN: [id number] DOB: [birth year] Today's Date: 02/20/2020   History of Present Illness  Jeffrey [name]  is a 74 y.o. male, with history of liver cirrhosis, HTN, GERD, DMII, ESRD, anemia, and more presents to the ED with a c/c of fall. Patient reports that he rolled out of bed at 7:00am while he was asleep. His head hit and open drawer from his dresser. He did not black out. He reports that it took three people to get him off of the ground. He reports that he knew he would need cleared by ER before dialysis would accept him. Patient reports that he has had generalized weakness over the last one two to weeks. It has been getting worse. Patient reports that he has had PT, but not since his last discharge on 9/11. Patient reports that he has been compliant with his medication, he has had a normal appetite, and he has not felt ill, aside from this weakness. He does report that he has had dyspnea over last 2 days. Last dialysis was on 9/17. Patient reports that he does make urine, and there has been no change in output, and no change in color or odor. Patient does have a chronic foley. He doesn't seem to have an understanding of why he has it - he reports that it is convenient.     Clinical Impression  Patient functioning near baseline for functional mobility and gait.  Patient demonstrates good return for getting into/out of bed and ambulation in hallway without loss of balance, limited mostly due to fatiguing/pain in shoulders with prolonged leaning on RW.     Follow Up Recommendations Home health PT;Supervision - Intermittent    Equipment Recommendations  None recommended by PT    Recommendations for Other Services       Precautions / Restrictions Precautions Precautions: Fall Restrictions Weight Bearing Restrictions: No      Mobility  Bed Mobility Overal bed mobility: Modified Independent             General bed mobility  comments: slightly decreased  Transfers Overall transfer level: Modified independent Equipment used: Rolling walker (2 wheeled)                Ambulation/Gait Ambulation/Gait assistance: Supervision Gait Distance (Feet): 100 Feet Assistive device: Rolling walker (2 wheeled) Gait Pattern/deviations: Decreased step length - right;Decreased step length - left;Decreased stride length Gait velocity: decreased   General Gait Details: slightly labored cadence without loss of balance  Stairs            Wheelchair Mobility    Modified Rankin (Stroke Patients Only)       Balance Overall balance assessment: Needs assistance Sitting-balance support: No upper extremity supported;Feet unsupported Sitting balance-Leahy Scale: Good Sitting balance - Comments: seated at EOB   Standing balance support: Bilateral upper extremity supported;During functional activity Standing balance-Leahy Scale: Fair Standing balance comment: fair/good using RW                             Pertinent Vitals/Pain Pain Assessment: Faces Faces Pain Scale: Hurts a little bit Pain Location: bilateral shoulders when leaning on RW Pain Descriptors / Indicators: Sore Pain Intervention(s): Limited activity within patient's tolerance;Monitored during session;Repositioned    Home Living Family/patient expects to be discharged to:: Private residence Living Arrangements: Spouse/significant other Available Help at Discharge: Family;Available PRN/intermittently Type of Home: House Home Access: Ramped entrance  Home Layout: Able to live on main level with bedroom/bathroom Home Equipment: Gilford Rile - 2 wheels;Walker - 4 wheels;Crutches;Grab bars - toilet;Toilet riser      Prior Function Level of Independence: Independent with assistive device(s)         Comments: household and limited community ambulator     Hand Dominance   Dominant Hand: Right    Extremity/Trunk Assessment    Upper Extremity Assessment Upper Extremity Assessment: Defer to OT evaluation    Lower Extremity Assessment Lower Extremity Assessment: Generalized weakness    Cervical / Trunk Assessment Cervical / Trunk Assessment: Normal  Communication   Communication: No difficulties  Cognition Arousal/Alertness: Awake/alert Behavior During Therapy: WFL for tasks assessed/performed Overall Cognitive Status: Within Functional Limits for tasks assessed                                        General Comments      Exercises     Assessment/Plan    PT Assessment Patient needs continued PT services  PT Problem List Decreased strength;Decreased activity tolerance;Decreased balance;Decreased mobility       PT Treatment Interventions DME instruction;Gait training;Stair training;Functional mobility training;Therapeutic activities;Therapeutic exercise;Balance training;Patient/family education    PT Goals (Current goals can be found in the Care Plan section)  Acute Rehab PT Goals Patient Stated Goal: return home with family to assist PT Goal Formulation: With patient Time For Goal Achievement: 02/23/20 Potential to Achieve Goals: Good    Frequency Min 2X/week   Barriers to discharge        Co-evaluation               AM-PAC PT "6 Clicks" Mobility  Outcome Measure Help needed turning from your back to your side while in a flat bed without using bedrails?: None Help needed moving from lying on your back to sitting on the side of a flat bed without using bedrails?: None Help needed moving to and from a bed to a chair (including a wheelchair)?: A Little Help needed standing up from a chair using your arms (e.g., wheelchair or bedside chair)?: None Help needed to walk in hospital room?: A Little Help needed climbing 3-5 steps with a railing? : A Little 6 Click Score: 21    End of Session   Activity Tolerance: Patient tolerated treatment well Patient left: in  bed;with call bell/phone within reach Nurse Communication: Mobility status PT Visit Diagnosis: Unsteadiness on feet (R26.81);Other abnormalities of gait and mobility (R26.89);Muscle weakness (generalized) (M62.81)    Time: 4827-0786 PT Time Calculation (min) (ACUTE ONLY): 20 min   Charges:   PT Evaluation $PT Eval Moderate Complexity: 1 Mod PT Treatments $Therapeutic Activity: 23-37 mins        3:19 PM, 02/20/20 Lonell Grandchild, MPT Physical Therapist with [facility] [phone] office [phone] mobile phone

## 2020-02-20 NOTE — Discharge Summary (Signed)
Physician Discharge Summary  Jeffrey [name] SPQ:[id number] DOB: Feb 02, 1946 DOA: 02/19/2020  PCP: Jeffrey Blitz, MD  Admit date: 02/19/2020 Discharge date: 02/20/2020  Time spent: 35 minutes  Recommendations for Outpatient Follow-up:  Repeat complete metabolic panel to follow LFTs, electrolytes and renal function Continue to further adjust medication to assist with volume management Follow patient for further assistance regarding weight and low-sodium intake -Discuss goals of care and advanced care planning.  Discharge Diagnoses:  Active Problems:   Chronic liver disease and cirrhosis (HCC)   Chronic hyponatremia   Acute hyponatremia Chronic diastolic heart failure Class II obesity ESRD Chronic atrial fibrillation Hypertension Hyperlipidemia  Discharge Condition: Stable and improved.  Discharged home with instruction to be compliant with his hemodialysis treatment and to follow-up with PCP in 10 days.  CODE STATUS: Full code  Diet recommendation: Low sodium/renal diet.  Filed Weights   02/19/20 1152  Weight: 104.3 kg    History of present illness:  As per H&P written by Dr.Zirle-Ghosh on 02/19/20  Jeffrey [name]  is a 74 y.o. male, with history of liver cirrhosis, HTN, GERD, DMII, ESRD, anemia, and more presents to the ED with a c/c of fall. Patient reports that he rolled out of bed at 7:00am while he was asleep. His head hit and open drawer from his dresser. He did not black out. He reports that it took three people to get him off of the ground. He reports that he knew he would need cleared by ER before dialysis would accept him. Patient reports that he has had generalized weakness over the last one two to weeks. It has been getting worse. Patient reports that he has had PT, but not since his last discharge on 9/11. Patient reports that he has been compliant with his medication, he has had a normal appetite, and he has not felt ill, aside from this weakness. He does report that he has had  dyspnea over last 2 days. Last dialysis was on 9/17. Patient reports that he does make urine, and there has been no change in output, and no change in color or odor. Patient does have a chronic foley. He doesn't seem to have an understanding of why he has it - he reports that it is convenient.   Patient denies fatigue. Wife reported to ED provider that patient had been sleeping all night and all day. She was concerned, because this is abnormal for him. She did not report that he was difficult to arouse. During my exam, he is drowsy, but easily woken, and oriented.  Patient denies any fevers, nausea, vomiting, chest pain, back pain, or any other complaints.   Patient has left arm fistula for dialysis access. His normal schedule is MWF.   IN the ED T 98.1, HR 86, R 18, BP 120/84 100% CT head = no acute intracranial abnormalities.  CXR = Patchy left basilar consolidation could reflect atelectasis or airspace disease Na 121 K+ 4.5 BUN/ Cr 42/3.99  Hospital Course:  1-metabolic encephalopathy -In the setting of hyponatremia most likely and the use of medications -After dialysis therapy provided patient mentation back to baseline -Stable ammonia level -CT head no acute abnormalities appreciated -Alcohol level, Tylenol and UDS without abnormal results. -Patient instructed to follow low-sodium diet and to take medications as prescribed  2-end-stage renal disease -Chronically on Monday, Wednesday and Friday schedule as an outpatient -Dialysis maze on 02/19/2020 after experiencing fall at home and coming to the hospital for evaluation. -Patient was successfully dialyzed on 02/20/2020  and will resume further treatments as an outpatient previously scheduled on 02/21/20 -Continue to follow nephrology service recommendation for IV iron and Epogen therapy.  3-anemia chronic kidney disease -Hemoglobin 8.2-8.5 -As mentioned above continue iron and Epogen infusion as per nephrology  discretion.  4-full -No loss of consciousness or prodromal symptoms -Patient seen by physical therapy who has recommended intermittent supervision and home health services -Will resume home health PT at discharge.  5-hyperlipidemia -Continue statins.  6-hyponatremia -Chronic and ranging 121-1 23-1 27 at baseline -Discussed with nephrology service who has recommended close outpatient follow-up -Dialysis was provided today -This is a very poor prognosis in someone with liver and renal failure.  7-CODE STATUS: -After discussion with patient he expressed that he had discussed with his wife and at this time he is reversing his DNR and would like to be resuscitated. -We will recommend further discussions about goals of care and advance care planning as an outpatient.  8-history of atrial fibrillation -Overall rate controlled -Resume home rate controlling agents  9-chronic liver disease -Continue PPI, rifaximin, lactulose, Lasix and spironolactone -Mild distention appreciated on his physical examination; no pain or concern for SBP. -Instructed to check his weight on daily basis and to follow low-sodium diet.  87-[facility] diastolic heart failure -Compensated overall -Continue to follow daily weights and low-sodium diet -Continue the use of diuretics regimen -Further volume management to be achieved with hemodialysis.  11-class II obesity -Low calorie diet, portion control and increase physical activity discussed with patient. -Body mass index is 36.02 kg/m.  Procedures:  See below for x-ray report  Hemodialysis provided 10/28/2019 in the hospital.  Consultations:  Nephrology service  Discharge Exam: Vitals:   02/20/20 1630 02/20/20 1645  BP: 129/60 (!) 130/56  Pulse: 95 94  Resp:    Temp:    SpO2:      General: Afebrile, feeling weak and deconditioned; but oriented x3 and in no major distress.  Expressed that he feels ready to go home.  No nausea  vomiting Cardiovascular: S1-S2, positive murmur, no rubs, no gallops, unable to properly assess JVD with body habitus. Respiratory: No wheezing, no using accessory muscle.  Positive scattered rhonchi. Abdomen: Mild distention appreciated, positive bowel sounds, soft, nontender on palpation. Extremities: No cyanosis or clubbing; 1+ edema appreciated bilaterally. Skin: Multiple bruises/abrasion appreciated on his upper forearms bilaterally; also with bruise right eye and base of his right neck after falling out of bed and hitting himself in the way down with a nightstand.  Discharge Instructions   Discharge Instructions    (HEART FAILURE PATIENTS) Call MD:  Anytime you have any of the following symptoms: 1) 3 pound weight gain in 24 hours or 5 pounds in 1 week 2) shortness of breath, with or without a dry hacking cough 3) swelling in the hands, feet or stomach 4) if you have to sleep on extra pillows at night in order to breathe.   Complete by: As directed    Diet - low sodium heart healthy   Complete by: As directed    Discharge instructions   Complete by: As directed    Take medications as prescribed Maintain adequate hydration Follow low-sodium diet (less than 2 g daily) Arrange follow-up with PCP in 10 days Be compliant with your hemodialysis treatment as an outpatient (next treatment 02/21/2020).   Increase activity slowly   Complete by: As directed    No wound care   Complete by: As directed      Allergies as of 02/20/2020  Reactions   Chlorhexidine    Tape Itching, Rash      Medication List    STOP taking these medications   HumaLOG KwikPen 100 UNIT/ML KwikPen Generic drug: insulin lispro   tiZANidine 2 MG tablet Commonly known as: ZANAFLEX     TAKE these medications   acetaminophen 500 MG tablet Commonly known as: TYLENOL Take 500 mg by mouth every 6 (six) hours as needed for mild pain.   Alcohol Prep 70 % Pads Apply 1 each topically 3 (three) times daily.    diltiazem 120 MG 24 hr capsule Commonly known as: Cardizem CD Take 1 capsule (120 mg total) by mouth daily.   furosemide 40 MG tablet Commonly known as: LASIX Take 40 mg by mouth 2 (two) times daily.   gabapentin 100 MG capsule Commonly known as: NEURONTIN TAKE 1 CAPSULE BY MOUTH AT BEDTIME   HYDROcodone-acetaminophen 5-325 MG tablet Commonly known as: NORCO/VICODIN Take 1 tablet by mouth 2 (two) times daily as needed for moderate pain or severe pain.   hydrOXYzine 25 MG capsule Commonly known as: VISTARIL Take 25 mg by mouth 3 (three) times daily as needed for itching.   iron polysaccharides 150 MG capsule Commonly known as: NIFEREX Take 150 mg by mouth at bedtime.   lactulose 10 GM/15ML solution Commonly known as: CHRONULAC Take 45 mLs (30 g total) by mouth 3 (three) times daily.   metoprolol succinate 25 MG 24 hr tablet Commonly known as: TOPROL-XL Take 1 tablet (25 mg total) by mouth daily. Take at bedtime  on Monday ,Wednesday and Friday due to dialaylisis What changed:   when to take this  additional instructions   milk thistle 175 MG tablet Take 175 mg by mouth 3 (three) times daily.   mirtazapine 30 MG tablet Commonly known as: REMERON Take 1 tablet (30 mg total) by mouth at bedtime.   multivitamin with minerals Tabs tablet Take 1 tablet by mouth in the morning.   NovoFine 32G X 6 MM Misc Generic drug: Insulin Pen Needle Inject into the skin.   nystatin powder Commonly known as: MYCOSTATIN/NYSTOP Apply topically 3 (three) times daily. Into groin area and affected area   omega-3 acid ethyl esters 1 g capsule Commonly known as: LOVAZA Take 2 g by mouth 2 (two) times daily.   omeprazole 40 MG capsule Commonly known as: PRILOSEC Take 40 mg by mouth in the morning.   pravastatin 20 MG tablet Commonly known as: PRAVACHOL Take 20 mg by mouth in the morning.   PROBIOTIC DAILY PO Take 1 tablet by mouth in the morning.   rifaximin 550 MG Tabs  tablet Commonly known as: Xifaxan Take 1 tablet (550 mg total) by mouth 2 (two) times daily.   spironolactone 50 MG tablet Commonly known as: ALDACTONE Take 50 mg by mouth in the morning.   tamsulosin 0.4 MG Caps capsule Commonly known as: FLOMAX Take 0.4 mg by mouth every evening.   thiamine 100 MG tablet Take 100 mg by mouth in the morning. Vitamin B-1   Tresiba FlexTouch 200 UNIT/ML FlexTouch Pen Generic drug: insulin degludec Inject 120 Units into the skin at bedtime. When blood sugars are up   triamcinolone cream 0.1 % Commonly known as: KENALOG Apply 1 application topically 2 (two) times daily as needed (dry skin Itching).   venlafaxine 75 MG tablet Commonly known as: EFFEXOR Take 75 mg by mouth 3 (three) times daily with meals.   vitamin B-12 1000 MCG tablet Commonly known as: CYANOCOBALAMIN  Take 1,000 mcg by mouth daily.   vitamin C 1000 MG tablet Take 1,000 mg by mouth 2 (two) times daily.      Allergies  Allergen Reactions  . Chlorhexidine   . Tape Itching and Rash    Follow-up Information    Jeffrey Blitz, MD. Schedule an appointment as soon as possible for a visit in 10 day(s).   Specialty: Internal Medicine Contact information: [place] [address] [phone]               The results of significant diagnostics from this hospitalization (including imaging, microbiology, ancillary and laboratory) are listed below for reference.    Significant Diagnostic Studies: CT ABDOMEN PELVIS WO CONTRAST  Result Date: 02/08/2020 CLINICAL DATA:  Abdominal pain, distention EXAM: CT ABDOMEN AND PELVIS WITHOUT CONTRAST TECHNIQUE: Multidetector CT imaging of the abdomen and pelvis was performed following the standard protocol without IV contrast. COMPARISON:  01/01/2020 FINDINGS: Lower chest: Small bilateral pleural effusions. Left base atelectasis. Coronary artery calcifications. Heart is normal size. Hepatobiliary: Changes of cirrhosis with nodular  contours. Multiple gallstones within the gallbladder. No focal hepatic abnormality or biliary ductal dilatation. Pancreas: No focal abnormality or ductal dilatation. Spleen: Splenomegaly with a craniocaudal length of 17.3 cm. No focal abnormality. Adrenals/Urinary Tract: No adrenal abnormality. No focal renal abnormality. No stones or hydronephrosis. Urinary bladder is unremarkable. Foley catheter present in the bladder. Stomach/Bowel: None Stomach, large and small bowel grossly unremarkable. Few scattered left colonic diverticula. Vascular/Lymphatic: Heavily calcified aorta and iliac vessels. No evidence of aneurysm or adenopathy. Reproductive: No visible focal abnormality. Other: Moderate ascites in the abdomen and pelvis. Musculoskeletal: No acute bony abnormality. Degenerative disc and facet disease throughout the lumbar spine. IMPRESSION: Changes of cirrhosis with associated splenomegaly and moderate ascites. Cholelithiasis. Aortic atherosclerosis.  Coronary artery disease. Small bilateral pleural effusions.  Left base atelectasis. Electronically Signed   By: Rolm Baptise M.D.   On: 02/08/2020 02:00   CT Head Wo Contrast  Result Date: 02/19/2020 CLINICAL DATA:  Acute pain due to trauma. Bruise to the right eye. Laceration to the chin. EXAM: CT HEAD WITHOUT CONTRAST TECHNIQUE: Contiguous axial images were obtained from the base of the skull through the vertex without intravenous contrast. COMPARISON:  12/13/2019 FINDINGS: Brain: No hemorrhage. No extraaxial collection.No midline shift. There is atrophy.The basal cisterns are unremarkable. Patchy and confluent areas of decreased attenuation are noted throughout the deep and periventricular white matter of the cerebral hemispheres bilaterally, compatible with chronic microvascular ischemic disease. There is an old infarct in the left cerebellum. The brainstem is unremarkable. The cerebellum is unremarkable. The sella is unremarkable. Vascular: No hyperdense  vessel or unexpected calcification. Skull: The calvarium is unremarkable. The skull base is unremarkable. The visualized upper cervical spine is unremarkable. Sinuses/Orbits: The visualized orbits are unremarkable. The paranasal sinuses are unremarkable. The mastoid air cells are clear. Other: The visualized parotid gland is unremarkable. There is mild right-sided periorbital soft tissue swelling. IMPRESSION: 1. No acute intracranial abnormality. 2. Atrophy and chronic microvascular ischemic disease. 3. Old left cerebellar infarct. Electronically Signed   By: Constance Holster M.D.   On: 02/19/2020 15:19   IR Removal Tun Cv Cath W/O FL  Result Date: 02/15/2020 INDICATION: Patient with a history of end-stage renal disease now has a functioning AV fistula. Interventional radiology asked to remove tunneled HD catheter. EXAM: REMOVAL TUNNELED CENTRAL VENOUS CATHETER MEDICATIONS: 1% lidocaine 10 mL ANESTHESIA/SEDATION: none FLUOROSCOPY TIME:  none COMPLICATIONS: None immediate. PROCEDURE: Informed written  consent was obtained from the patient after a thorough discussion of the procedural risks, benefits and alternatives. All questions were addressed. Maximal Sterile Barrier Technique was utilized including caps, mask, sterile gowns, sterile gloves, sterile drape, hand hygiene and skin antiseptic. A timeout was performed prior to the initiation of the procedure. The patient's right chest and catheter was prepped and draped in a normal sterile fashion. Heparin was removed from both ports of catheter. 1% lidocaine was used for local anesthesia. Using gentle blunt dissection the cuff of the catheter was exposed and the catheter was removed in it's entirety. Pressure was held till hemostasis was obtained. A sterile dressing was applied. The patient tolerated the procedure well with no immediate complications. IMPRESSION: Successful catheter removal as described above. Read by: Soyla Dryer, NP Electronically Signed    By: Corrie Mckusick D.O.   On: 02/15/2020 12:35   DG Chest Portable 1 View  Result Date: 02/19/2020 CLINICAL DATA:  Short of breath, previous tobacco abuse, fell EXAM: PORTABLE CHEST 1 VIEW COMPARISON:  02/08/2020 FINDINGS: Single frontal view of the chest demonstrates persistent enlargement the cardiac silhouette. There is chronic vascular congestion, with patchy left basilar consolidation. Small left effusion. No pneumothorax. No acute bony abnormalities. IMPRESSION: 1. Patchy left basilar consolidation could reflect atelectasis or airspace disease. 2. Small left pleural effusion. 3. Chronic central vascular congestion. Electronically Signed   By: Randa Ngo M.D.   On: 02/19/2020 19:56   DG Chest Port 1 View  Result Date: 02/08/2020 CLINICAL DATA:  Shortness of breath EXAM: PORTABLE CHEST 1 VIEW COMPARISON:  01/19/2020 FINDINGS: Right dialysis catheter remains in place, unchanged. Cardiomegaly, vascular congestion. No confluent opacities or effusions. No acute bony abnormality. IMPRESSION: Cardiomegaly, vascular congestion. Electronically Signed   By: Rolm Baptise M.D.   On: 02/08/2020 00:52   [place] ASCITES (ABDOMEN LIMITED)  Result Date: 02/08/2020 CLINICAL DATA:  Alcoholic cirrhosis, ascites EXAM: LIMITED ABDOMEN ULTRASOUND FOR ASCITES TECHNIQUE: Limited ultrasound survey for ascites was performed in all four abdominal quadrants. COMPARISON:  CT abdomen and pelvis 02/08/2020 FINDINGS: Small pocket of ascites in the LEFT lower quadrant. Additional ascites is seen due extending deep into the pelvis, not accessible. No drainable ascites collection identified. IMPRESSION: Insufficient ascites for paracentesis. Electronically Signed   By: Lavonia Dana M.D.   On: 02/08/2020 10:47    Microbiology: Recent Results (from the past 240 hour(s))  SARS Coronavirus 2 by RT PCR (hospital order, performed in [facility] hospital lab) Nasopharyngeal Nasopharyngeal Swab     Status: None   Collection Time: 02/19/20   7:23 PM   Specimen: Nasopharyngeal Swab  Result Value Ref Range Status   SARS Coronavirus 2 NEGATIVE NEGATIVE Final    Comment: (NOTE) SARS-CoV-2 target nucleic acids are NOT DETECTED.  The SARS-CoV-2 RNA is generally detectable in upper and lower respiratory specimens during the acute phase of infection. The lowest concentration of SARS-CoV-2 viral copies this assay can detect is 250 copies / mL. A negative result does not preclude SARS-CoV-2 infection and should not be used as the sole basis for treatment or other patient management decisions.  A negative result may occur with improper specimen collection / handling, submission of specimen other than nasopharyngeal swab, presence of viral mutation(s) within the areas targeted by this assay, and inadequate number of viral copies (<250 copies / mL). A negative result must be combined with clinical observations, patient history, and epidemiological information.  Fact Sheet for Patients:   StrictlyIdeas.no  Fact Sheet for Healthcare Providers:  BankingDealers.co.za  This test is not yet approved or  cleared by the [place] and has been authorized for detection and/or diagnosis of SARS-CoV-2 by FDA under an Emergency Use Authorization (EUA).  This EUA will remain in effect (meaning this test can be used) for the duration of the COVID-19 declaration under Section 564(b)(1) of the Act, 21 U.S.C. section 360bbb-3(b)(1), unless the authorization is terminated or revoked sooner.  Performed at [facility], [address] [place], [place] [address]      Labs: Basic Metabolic Panel: Recent Labs  Lab 02/19/20 1617 02/20/20 0636  NA 121* 122*  K 4.5 4.9  CL 86* 88*  CO2 22 22  GLUCOSE 145* 144*  BUN 42* 48*  CREATININE 3.99* 4.31*  CALCIUM 9.0 8.7*  MG  --  1.9   Liver Function Tests: Recent Labs  Lab 02/20/20 0636  AST 45*  ALT 24  ALKPHOS 232*  BILITOT 1.3*  PROT  7.2  ALBUMIN 2.6*    Recent Labs  Lab 02/19/20 2156  AMMONIA 62*   CBC: Recent Labs  Lab 02/19/20 1617 02/20/20 0636  WBC 4.5 6.1  NEUTROABS 3.8 5.3  HGB 8.5* 8.2*  HCT 28.0* 26.2*  MCV 99.3 95.6  PLT 152 165    Signed:  Barton Dubois MD.  [facility] 02/20/2020, 5:19 PM

## 2020-02-20 NOTE — Consult Note (Signed)
ESRD Consult Note [facility]  Requesting provider: Barton Dubois, MD Reason for consult: ESRD, provision of dialysis  Outpatient dialysis unit: [facility] Outpatient dialysis schedule: MWF  Assessment/Recommendations: Jeffrey [name] is a 74 y.o. male with a past medical history notable for ESRD on HD admitted with fall.  ESRD: Outpatient orders (discussed with his HD unit): Chair time: 12:15pm. 4 hrs, Revaclear 300, edw 96kg, 138Na, 2k, 2.5cal, 350/600. 15g needles. No heparin -HD today and then resume MWF schedule, if still here  Volume/ hypertension: EDW 96kg -continue with home lasix and aldactone as well as metoprolol  Hyponatremia, chronic -secondary to liver disease and free water retention. Overall poor prognostic indicator in cirrhosis  Anemia of Chronic Kidney Disease, history of transfusion dependent anemia: Hemoglobin 8.2 -epo 10k units with treatments  Secondary Hyperparathyroidism/Hyperphosphatemia: check phos  Vascular access: tdc and lue avf.  Cirrhosis -mgmt per primary service. -consider revisiting GOC with palliative care  DM2 -mgmt per primary service  Fall, mgmt per primary. Seems to be non-syncopal  Chronic Foley  Additional recommendations: - Dose all meds for creatinine clearance < 10 ml/min  - Unless absolutely necessary, no MRIs with gadolinium.  - Implement save arm precautions.  Prefer needle sticks in the dorsum of the hands or wrists.  No blood pressure measurements in arm. - If blood transfusion is requested during hemodialysis sessions, please alert [place] prior to the session.  - If a hemodialysis catheter line culture is requested, please alert [place] as only hemodialysis nurses are able to collect those specimens.   Recommendations were discussed with the primary team.  [name], MD [facility]  History of Present Illness: Jeffrey [name] is a/an 74 y.o. male with a past medical history of ESRD, dialysis  non-adherence, hypertension, diabetes mellitus type 2, hyperlipidemia, transfusion dependent anemia, portal hypertensive gastropathy, esophageal varices, alcoholic liver cirrhosis, chronic hyponatremia, multiple admissions to [facility] presents with with a fall while he was trying to roll out of bed.  He had hit his head.  No syncopal episodes.  Needed a lot of help to get off the floor.  Therefore missed dialysis because he was anticipating that he will need to be cleared before dialysis would accept him.  Last dialysis was on Friday 9/17.  Still making urine.  Has a chronic Foley but not sure why.  CT head was negative for any kind of bleed or acute intracranial abnormalities. Was recently discharged on 9/10, was admitted here for back pain.  He did have slight fever while he was here however his infectious work-up was negative.  Of note, he did receive a few doses of vancomycin but stopped outpatient on 9/15 (last dose). TDC removed last week. Overall feels well, feels like he is ready to go home.   Medications:  Current Facility-Administered Medications  Medication Dose Route Frequency Provider Last Rate Last Admin  . acetaminophen (TYLENOL) tablet 650 mg  650 mg Oral Q6H PRN Zierle-Ghosh, Asia B, DO       Or  . acetaminophen (TYLENOL) suppository 650 mg  650 mg Rectal Q6H PRN Zierle-Ghosh, Asia B, DO      . cyclobenzaprine (FLEXERIL) tablet 10 mg  10 mg Oral QHS Zierle-Ghosh, Asia B, DO   10 mg at 02/19/20 2343  . diltiazem (CARDIZEM CD) 24 hr capsule 120 mg  120 mg Oral Daily Zierle-Ghosh, Asia B, DO      . furosemide (LASIX) tablet 40 mg  40 mg Oral BID Zierle-Ghosh, [place] B,  DO   40 mg at 02/19/20 2342  . gabapentin (NEURONTIN) capsule 100 mg  100 mg Oral QHS Zierle-Ghosh, Asia B, DO   100 mg at 02/19/20 2342  . heparin injection 5,000 Units  5,000 Units Subcutaneous Q8H Zierle-Ghosh, Asia B, DO   5,000 Units at 02/20/20 0704  . HYDROcodone-acetaminophen (NORCO/VICODIN) 5-325 MG per tablet 1 tablet   1 tablet Oral BID PRN Zierle-Ghosh, Asia B, DO      . hydrOXYzine (ATARAX/VISTARIL) tablet 25 mg  25 mg Oral TID PRN Zierle-Ghosh, Asia B, DO      . iron polysaccharides (NIFEREX) capsule 150 mg  150 mg Oral QHS Zierle-Ghosh, Asia B, DO      . lactulose (CHRONULAC) 10 GM/15ML solution 30 g  30 g Oral TID Zierle-Ghosh, Asia B, DO   30 g at 02/19/20 2340  . metoprolol succinate (TOPROL-XL) 24 hr tablet 25 mg  25 mg Oral See admin instructions Zierle-Ghosh, Asia B, DO      . mirtazapine (REMERON) tablet 30 mg  30 mg Oral QHS Zierle-Ghosh, Asia B, DO   30 mg at 02/19/20 2342  . morphine 2 MG/ML injection 2 mg  2 mg Intravenous Q2H PRN Zierle-Ghosh, Asia B, DO      . ondansetron (ZOFRAN) tablet 4 mg  4 mg Oral Q6H PRN Zierle-Ghosh, Asia B, DO       Or  . ondansetron (ZOFRAN) injection 4 mg  4 mg Intravenous Q6H PRN Zierle-Ghosh, Asia B, DO   4 mg at 02/20/20 0419  . polyethylene glycol (MIRALAX / GLYCOLAX) packet 17 g  17 g Oral Daily PRN Zierle-Ghosh, Asia B, DO      . pravastatin (PRAVACHOL) tablet 20 mg  20 mg Oral q1800 Zierle-Ghosh, Asia B, DO      . rifaximin (XIFAXAN) tablet 550 mg  550 mg Oral BID Zierle-Ghosh, Asia B, DO   550 mg at 02/19/20 2343  . spironolactone (ALDACTONE) tablet 50 mg  50 mg Oral Daily Zierle-Ghosh, Asia B, DO      . venlafaxine (EFFEXOR) tablet 75 mg  75 mg Oral TID WC Zierle-Ghosh, Asia B, DO       Current Outpatient Medications  Medication Sig Dispense Refill  . acetaminophen (TYLENOL) 500 MG tablet Take 500 mg by mouth every 6 (six) hours as needed for mild pain.    . Ascorbic Acid (VITAMIN C) 1000 MG tablet Take 1,000 mg by mouth 2 (two) times daily.    [name] diltiazem (CARDIZEM CD) 120 MG 24 hr capsule Take 1 capsule (120 mg total) by mouth daily. 30 capsule 11  . furosemide (LASIX) 40 MG tablet Take 40 mg by mouth 2 (two) times daily.    [name] gabapentin (NEURONTIN) 100 MG capsule TAKE 1 CAPSULE BY MOUTH AT BEDTIME (Patient taking differently: Take 100 mg by mouth at  bedtime. ) 90 capsule 0  . HUMALOG KWIKPEN 100 UNIT/ML KwikPen Inject 50 Units into the skin as needed (Sugar). Sliding scale    . HYDROcodone-acetaminophen (NORCO/VICODIN) 5-325 MG tablet Take 1 tablet by mouth 2 (two) times daily as needed for moderate pain or severe pain.     . hydrOXYzine (VISTARIL) 25 MG capsule Take 25 mg by mouth 3 (three) times daily as needed for itching.     . insulin degludec (TRESIBA FLEXTOUCH) 200 UNIT/ML FlexTouch Pen Inject 30 Units into the skin at bedtime. When blood sugars are up (Patient taking differently: Inject 120 Units into the skin at bedtime. When blood sugars  are up)    . iron polysaccharides (NIFEREX) 150 MG capsule Take 150 mg by mouth at bedtime.     [name] lactulose (CHRONULAC) 10 GM/15ML solution Take 45 mLs (30 g total) by mouth 3 (three) times daily. 236 mL 1  . metoprolol succinate (TOPROL-XL) 25 MG 24 hr tablet Take 1 tablet (25 mg total) by mouth daily. Take at bedtime  on Monday ,Wednesday and Friday due to dialaylisis (Patient taking differently: Take 25 mg by mouth See admin instructions. Take 1 tablet twice a day, but take ONLY at bedtime on Monday ,Wednesday and Friday due to dialysis.)    . milk thistle 175 MG tablet Take 175 mg by mouth 3 (three) times daily.     . mirtazapine (REMERON) 30 MG tablet Take 1 tablet (30 mg total) by mouth at bedtime. 30 tablet 3  . Multiple Vitamin (MULTIVITAMIN WITH MINERALS) TABS tablet Take 1 tablet by mouth in the morning.     . nystatin (MYCOSTATIN/NYSTOP) powder Apply topically 3 (three) times daily. Into groin area and affected area 45 g 0  . omega-3 acid ethyl esters (LOVAZA) 1 g capsule Take 2 g by mouth 2 (two) times daily.     [name] omeprazole (PRILOSEC) 40 MG capsule Take 40 mg by mouth in the morning.     . pravastatin (PRAVACHOL) 20 MG tablet Take 20 mg by mouth in the morning.     . Probiotic Product (PROBIOTIC DAILY PO) Take 1 tablet by mouth in the morning.     . rifaximin (XIFAXAN) 550 MG TABS tablet  Take 1 tablet (550 mg total) by mouth 2 (two) times daily. 60 tablet 11  . spironolactone (ALDACTONE) 50 MG tablet Take 50 mg by mouth in the morning.     . tamsulosin (FLOMAX) 0.4 MG CAPS capsule Take 0.4 mg by mouth every evening.    . thiamine 100 MG tablet Take 100 mg by mouth in the morning. Vitamin B-1    . tiZANidine (ZANAFLEX) 2 MG tablet Take 2 mg by mouth 2 (two) times daily.    [name] triamcinolone cream (KENALOG) 0.1 % Apply 1 application topically 2 (two) times daily as needed (dry skin Itching).   3  . venlafaxine (EFFEXOR) 75 MG tablet Take 75 mg by mouth 3 (three) times daily with meals.     . vitamin B-12 (CYANOCOBALAMIN) 1000 MCG tablet Take 1,000 mcg by mouth daily.    . Alcohol Swabs (ALCOHOL PREP) 70 % PADS Apply 1 each topically 3 (three) times daily.    [name] NOVOFINE 32G X 6 MM MISC Inject into the skin.       ALLERGIES Chlorhexidine and Tape  MEDICAL HISTORY Past Medical History:  Diagnosis Date  . A-fib [facility])    when initially stating dialysis january 2021 at [facility] per spouse; never heard anything else about it  . Alcoholic cirrhosis ([place])    patient reports completing hep a and b vaccines in 2006  . Anemia    has had 3 units of prbcs 2013  . Anxiety   . Arthritis   . Asymptomatic gallstones    Ultrasound in 2006  . C. difficile colitis   . Cholelithiasis   . Chronic kidney disease    Dialysis M/W/F/Sa  . Depression   . Diabetes ([place])   . Duodenal ulcer with hemorrhage    per patient in 2006 or 2007, records have been requested  . Dyspnea    low oxygen level -  has  O2 2 L all day  . 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 GERD (gastroesophageal reflux disease)   . Heart burn   . 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 HOH (hard of hearing)   . Hypertension   . Liver cirrhosis ([place])   . Splenomegaly    Ultrasound in 2006     SOCIAL HISTORY Social History   Socioeconomic History  . Marital status: Married    Spouse name: Not on  file  . Number of children: 2  . Years of education: Not on file  . Highest education level: Not on file  Occupational History  . Occupation: RETIRED    Employer: SELF EMPLOYED  Tobacco Use  . Smoking status: Former Smoker    Packs/day: 2.00    Years: 25.00    Pack years: 50.00    Types: Cigarettes    Quit date: 01/28/1986    Years since quitting: 34.0  . Smokeless tobacco: Never Used  Vaping Use  . Vaping Use: Never used  Substance and Sexual Activity  . Alcohol use: No    Comment: quit in 05/2013  . Drug use: No  . Sexual activity: Not Currently  Other Topics Concern  . Not on file  Social History Narrative   Two step children from second marriage (divorced) who live with him along with some grandchildren.    Social Determinants of Health   Financial Resource Strain:   . Difficulty of Paying Living Expenses: Not on file  Food Insecurity: No Food Insecurity  . Worried About Charity fundraiser in the Last Year: Never true  . Ran Out of Food in the Last Year: Never true  Transportation Needs: No Transportation Needs  . Lack of Transportation (Medical): No  . Lack of Transportation (Non-Medical): No  Physical Activity:   . Days of Exercise per Week: Not on file  . Minutes of Exercise per Session: Not on file  Stress:   . Feeling of Stress : Not on file  Social Connections:   . Frequency of Communication with Friends and Family: Not on file  . Frequency of Social Gatherings with Friends and Family: Not on file  . Attends Religious Services: Not on file  . Active Member of Clubs or Organizations: Not on file  . Attends Archivist Meetings: Not on file  . Marital Status: Not on file  Intimate Partner Violence:   . Fear of Current or Ex-Partner: Not on file  . Emotionally Abused: Not on file  . Physically Abused: Not on file  . Sexually Abused: Not on file     FAMILY HISTORY Family History  Problem Relation Age of Onset  . Colon cancer Father 9        deceased age 24  . Breast cancer Sister        deceased  . Diabetes Sister   . Stroke Mother   . Healthy Son   . Healthy Daughter   . Lung cancer Neg Hx   . Ovarian cancer Neg Hx      Review of Systems: Unobtainable secondary to patient's clinical status.  Physical Exam: Vitals:   02/20/20 0418 02/20/20 0731  BP: (!) 138/50 (!) 152/50  Pulse: 83 89  Resp: 18 18  Temp: 98.3 F (36.8 C) 97.7 F (36.5 C)  SpO2: 93% 97%   No intake/output data recorded. No intake or output data in the 24 hours ending 02/20/20 0900 General:  chronically ill appearing, sitting up in bed eating breakfast, NAD HEENT: icteric sclera, MMM, ecchymosis right eye and right chin CV: normal rate Lungs: bilateral chest rise, normal wob, diminished air entry bibasilar w/ fine crackles Abd: soft, nontender, distended Skin: ecchymosis over avf. jaundiced MSK: lue avf with +b/t, 2+ pitting edema bilateral lower extremities Neuro: awake, alert and oriented, speech clear and coherent, following commands  Test Results Reviewed Lab Results  Component Value Date   NA 122 (L) 02/20/2020   K 4.9 02/20/2020   CL 88 (L) 02/20/2020   CO2 22 02/20/2020   BUN 48 (H) 02/20/2020   CREATININE 4.31 (H) 02/20/2020   CALCIUM 8.7 (L) 02/20/2020   ALBUMIN 2.6 (L) 02/20/2020   PHOS 3.9 02/09/2020    I have reviewed relevant outside healthcare records

## 2020-02-21 DIAGNOSIS — T82898A Other specified complication of vascular prosthetic devices, implants and grafts, initial encounter: Secondary | ICD-10-CM | POA: Diagnosis not present

## 2020-02-21 DIAGNOSIS — R509 Fever, unspecified: Secondary | ICD-10-CM | POA: Diagnosis not present

## 2020-02-21 DIAGNOSIS — Z992 Dependence on renal dialysis: Secondary | ICD-10-CM | POA: Diagnosis not present

## 2020-02-21 DIAGNOSIS — N186 End stage renal disease: Secondary | ICD-10-CM | POA: Diagnosis not present

## 2020-02-21 NOTE — Progress Notes (Signed)
Referring Provider: Monico Blitz, MD Primary Care Physician:  Monico Blitz, MD Primary GI Physician: Dr. Gala Romney  Chief Complaint  Patient presents with  . Cirrhosis  . Abdominal Pain    R side    HPI:   Jeffrey [name] is a 74 y.o. male presenting today for follow-up. 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  He is also on Xifaxan twice daily.  EGD up-to-date in April 2021.  Colonoscopy in January 2021 at outside facility with portal colopathy and diffuse edema, diffuse edema, changes of C. difficile colitis.  Stated there appeared to be some 3 polyps that were not removed due to concerns for bleeding.  Last seen in office 11/16/2019.  Clinically, he is volume overloaded.  He reported dialysis has been increased to 4 days a week about 1 month ago he was not going to all appointments.  Denied confusion on Xifaxan twice daily.  Stated while at dialysis, he was told his hemoglobin was declining.  No GI bleeding.  He had started to receive Epogen at dialysis.    On repeat labs and corresponded to meld 24 which was stable. Hemoglobin had dropped down to 6.9.  He was arranged for 1 unit PRBCs.  He was also referred to hematology who he has now established with.  They have ordered IV iron and he continues to get Epogen at dialysis.  Patient has been admitted to the hospital 4 times since his last office visit: 12/13/2019-12/17/2019: Hepatic encephalopathy, Enterobacter UTI, epistaxis, hyponatremia, and hyperkalemia.  He was restarted on lactulose 45 mL twice daily.  Received IV  Rocephin which was transitioned to oral ciprofloxacin x7 days for UTI.  Referred to ENT for epistaxis.  He received 3 units PRBCs transfusion during admission. 12/31/2019-01/06/2020: He presented with abdominal pain, acute encephalopathy in the setting of hyperammoniemia and hypoglycemia, and pleural effusions s/p left-sided thoracentesis.  He was noncompliant with lactulose at home. Ammonia improved with adjusting lactulose. Para with no SBP. No acute pathology on CT to explain abdominal pain. This seemed to be more of a chronic thing and possibly secondary to gastritis. PPI was resumed inpatient; abdominal pain resolved.   01/19/2020-01/22/2020: Hepatic encephalopathy.  He had missed dialysis and stopped taking lactulose x48 hours.  Symptoms improved with lactulose and dialysis. 02/07/2020-02/10/2020: He presented with abdominal pain in the RLQ and back pain in the lumbosacral area, missed 2 days of dialysis and noted worsening abdominal distention, also with nausea and vomiting.  CT A/P with no acute findings, moderate ascites.  Ultrasound with insufficient fluid for paracentesis.  Chest x-ray with cardiomegaly and vascular congestion.  He underwent back-to-back dialysis sessions and volume status improved.  He was also advised to follow-up with Dr. Dorris Fetch due to confusion about his insulin regimen.  He presented to the emergency room 02/19/20 after falling out of bed.  CT head negative.  Sodium was 121.  Patient reported increased shortness of breath when supine.  Also with mild confusion.  Ammonia was slightly elevated at 62.  He was ultimately admitted overnight to receive dialysis which was performed on 9/21 and patient was back to his  baseline. Notably UA with large leukocytes. Doesn't appear this has been treated.   Today:  [place] out of the bed Monday morning. Has a black eye and bruising on his chin and right arm/hand. He took a muscle relaxer before he left his hourse this morning and is falling asleep during  our visit. Per wife, patient has not had any confusion since discharge on 9/11. Muscle relaxer starts with a "T". Can't remember the name. Difficult to obtain a good history from patient today. Wife helped.   RUQ pain: 4+ weeks of intermittent daily RUQ pain. Can be 4-5 times a day. Skin feels numb.  Pain will radiate to his back. Sometimes pain will occur at the same time as his back pain. Back pain is chronic. RUQ pain doesn't seem to be associated with meals. Patient states it is like the skin is sore. He feels the pain when his shirt rubs over the area.   No nausea. Reports he had an episode of vomiting in the hospital during last admission. No vomiting routinely, none at home. No GERD symptoms. Taking omeprazole 40 mg daily. Also takes tums with meals per dialysis recommendations.   Misses dialysis about 3 times a months. Hasn't missed any this week.   No black stools or blood in the stool. 2BMs daily if taking lactulose. Per wife, he is tacking lactulose 30 ml BID most days.  If he goes to dialysis, he will take it once if he takes it at all. Stools are loose/watery when taking lactulose.   Feels his abdomen is mildly distended but not tight. Doesn't feel he needs a para right now.   Had been getting vancomycin at dialysis. Not sure if he is still getting antibiotic. This was for an infection in his right IJ hemodialysis catheter. Catheter has been removed.   No NSAIDs. No regular tylenol. No alcohol.   Some SOB. On O2 at night. Hasn't needed it during the day. No worsening of his shortness of breath recently.   Wants to wait until Tuesday for urine culture. Plans to see PCP with lab appointment on Tuesday.    Past Medical History:  Diagnosis Date  . A-fib [facility])    when initially stating dialysis january 2021 at [facility] per spouse; never heard anything else about it  . Alcoholic cirrhosis ([place])    patient reports completing hep a and b vaccines in 2006  . Anemia    has had  3 units of prbcs 2013  . Anxiety   . Arthritis   . Asymptomatic gallstones    Ultrasound in 2006  . C. difficile colitis   . Cholelithiasis   . Chronic kidney disease    Dialysis M/W/F/Sa  . Depression   . Diabetes ([place])   . Duodenal ulcer with hemorrhage    per patient in 2006 or 2007, records have been requested  . Dyspnea    low oxygen level -  has O2 2 L all day  . 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 GERD (gastroesophageal reflux disease)   . Heart burn   . 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 HOH (hard of hearing)   . Hypertension   . Liver cirrhosis ([place])   . Splenomegaly    Ultrasound in 2006    Past Surgical History:  Procedure Laterality Date  . AGILE CAPSULE N/A 09/20/2019   Procedure: AGILE CAPSULE;  Surgeon: Daneil Dolin, MD;  [facility];  Service: Endoscopy;  Laterality: N/A;  . APPENDECTOMY    . AV FISTULA PLACEMENT Left 08/17/2019   Procedure: ARTERIOVENOUS (AV) FISTULA CREATION VERSES ARTERIOVENOUS GRAFT;  Surgeon: [name], MD;  Location: [place];  Service: Vascular;  Laterality: Left;  . BASCILIC VEIN TRANSPOSITION Left 09/28/2019   Procedure: LEFT SECOND STAGE BASCILIC VEIN TRANSPOSITION;  Surgeon: [name], MD;  Location: [place];  Service: Vascular;  Laterality: Left;  . BIOPSY  02/03/2018   Procedure: BIOPSY;  Surgeon: Daneil Dolin, MD;  [facility];  Service: Endoscopy;;  bx of gastric polyps  . BIOPSY  09/19/2019   Procedure: BIOPSY;  Surgeon: Danie Binder, MD;  [facility];  Service: Endoscopy;;  . CATARACT EXTRACTION Right   . COLONOSCOPY  03/10/2005   Rectal polyp as described above, removed with snare. Left sided  diverticula. The remainder of the colonic mucosa appeared normal. Inflammed polyp on path.  . COLONOSCOPY  04/1999   Dr. Thea Silversmith polyps removed,  Path showed hyperplastic  . COLONOSCOPY  10/2015   Dr. Britta Mccreedy: diverticulosis, single sessile tubular adenoma 3-29mm in size  removed from descending colon.   . COLONOSCOPY WITH ESOPHAGOGASTRODUODENOSCOPY (EGD)  03/31/2012   TGG:YIRSWNI AVMs. Colonic diverticulosis. tubular adenoma colon  . COLONOSCOPY WITH ESOPHAGOGASTRODUODENOSCOPY (EGD)  06/2019   FORSYTH: small esophageal varices without high risk stigmata, single large AVM on lesser curvature in gastric body s/p APC ablation, gastric antral and duodenal bulb polyposis. No significant source to explain transfusion dependent anemia. Colonoscopy with portal colopathy and diffuse edema, changes of Cdiff colitis on colonoscopy  . ESOPHAGEAL BANDING N/A 02/03/2018   Procedure: ESOPHAGEAL BANDING;  Surgeon: Daneil Dolin, MD;  [facility];  Service: Endoscopy;  Laterality: N/A;  . ESOPHAGOGASTRODUODENOSCOPY  01/15/2005   Three columns grade 1 to 2 esophageal varices, otherwise normal esophageal mucosa.  Esophagus was not manipulated otherwise./Nodularity of the antrum with overlying erosions, nonspecific finding. Path showed rare H.pylori  . ESOPHAGOGASTRODUODENOSCOPY  09/2008   Dr. Gaylene Brooks columns of grade 2-3 esoph varices, only one column was prominent. Portal gastropathy, multiple gastrc polyps at antrum, two were 2cm with black eschar, bulbar polyps, bulbar erosions  . ESOPHAGOGASTRODUODENOSCOPY  11/2004   Dr. Brantley Stage 3 esoph varices  . ESOPHAGOGASTRODUODENOSCOPY  03/31/2012   RMR: 4 columns(3-GR 2, 1-GR1) non-bleeding esophageal varices, portal gastropathy, small HH, early GAVE, multiple gastric polyps   . ESOPHAGOGASTRODUODENOSCOPY (EGD) WITH PROPOFOL N/A 02/03/2018   Dr. Gala Romney: Esophageal varices, 3 columns grade 1-2.  Portal hypertensive gastropathy.  Multiple gastric polyps, biopsy consistent with hyperplastic.  [name] ESOPHAGOGASTRODUODENOSCOPY (EGD) WITH PROPOFOL N/A 09/19/2019   Fields: grade I and II esophageal varcies, mild portal hypertensive gastropathy, moderate gastritis but no H. pylori, single hyperplastic gastric polyp removed,  obvious source for melena/transfusion dependent anemia not identified, may be due to friable gastric and duodenal mucosa in the setting of portal hypertension  . GASTRIC VARICES BANDING  03/31/2012   Procedure: GASTRIC VARICES BANDING;  Surgeon: Daneil Dolin, MD;  [facility];  Service: Endoscopy;  Laterality: N/A;  . GIVENS CAPSULE STUDY N/A 09/21/2019   Poor study with a lot of debris obstructing much of the view of the first approximate 3 hours out of 6 hours.  No obvious source of bleeding identified.  Sequela of bleeding and old blood seen in the form of"whisps" of blood, blood flecks mostly toward the end of the study.  . IR REMOVAL TUN CV CATH  W/O FL  02/15/2020  . POLYPECTOMY  09/19/2019   Procedure: POLYPECTOMY;  Surgeon: Danie Binder, MD;  [facility];  Service: Endoscopy;;  . UMBILICAL HERNIA REPAIR  2017   exploratory laparotomy, abdominal washout    Current Outpatient Medications  Medication Sig Dispense Refill  . acetaminophen (TYLENOL) 500 MG tablet Take 500 mg by mouth every 6 (six) hours as needed for mild pain.    . Ascorbic Acid (VITAMIN C) 1000 MG tablet Take 1,000 mg by mouth 2 (two) times daily.    [name] diltiazem (CARDIZEM CD) 120 MG 24 hr capsule Take 1 capsule (120 mg total) by mouth daily. 30 capsule 11  . furosemide (LASIX) 40 MG tablet Take 40 mg by mouth 2 (two) times daily.    [name] gabapentin (NEURONTIN) 100 MG capsule TAKE 1 CAPSULE BY MOUTH AT BEDTIME (Patient taking differently: Take 100 mg by mouth at bedtime. ) 90 capsule 0  . HYDROcodone-acetaminophen (NORCO/VICODIN) 5-325 MG tablet Take 1 tablet by mouth 2 (two) times daily as needed for moderate pain or severe pain.     . hydrOXYzine (VISTARIL) 25 MG capsule Take 25 mg by mouth 3 (three) times daily as needed for itching.     . insulin degludec (TRESIBA FLEXTOUCH) 200 UNIT/ML FlexTouch Pen Inject 120 Units into the skin at bedtime. When blood sugars are up    . iron polysaccharides  (NIFEREX) 150 MG capsule Take 150 mg by mouth at bedtime.     [name] lactulose (CHRONULAC) 10 GM/15ML solution Take 45 mLs (30 g total) by mouth 3 (three) times daily. 236 mL 1  . metoprolol succinate (TOPROL-XL) 25 MG 24 hr tablet Take 1 tablet (25 mg total) by mouth daily. Take at bedtime  on Monday ,Wednesday and Friday due to dialaylisis (Patient taking differently: Take 25 mg by mouth See admin instructions. Take 1 tablet twice a day, but take ONLY at bedtime on Monday ,Wednesday and Friday due to dialysis.)    . milk thistle 175 MG tablet Take 175 mg by mouth 3 (three) times daily.     . mirtazapine (REMERON) 30 MG tablet Take 1 tablet (30 mg total) by mouth at bedtime. 30 tablet 3  . Multiple Vitamin (MULTIVITAMIN WITH MINERALS) TABS tablet Take 1 tablet by mouth in the morning.     . nystatin (MYCOSTATIN/NYSTOP) powder Apply topically 3 (three) times daily. Into groin area and affected area 45 g 0  . omega-3 acid ethyl esters (LOVAZA) 1 g capsule Take 2 g by mouth 2 (two) times daily.     [name] omeprazole (PRILOSEC) 40 MG capsule Take 40 mg by mouth in the morning.     . pravastatin (PRAVACHOL) 20 MG tablet Take 20 mg by mouth in the morning.     . Probiotic Product (PROBIOTIC DAILY PO) Take 1 tablet by mouth in the morning.     . rifaximin (XIFAXAN) 550 MG TABS tablet Take 1 tablet (550 mg total) by mouth 2 (two) times daily. 60 tablet 11  . spironolactone (ALDACTONE) 50 MG tablet Take 50 mg by mouth in the morning.     . tamsulosin (FLOMAX) 0.4 MG CAPS capsule Take 0.4 mg by mouth every evening.    . thiamine 100 MG tablet Take 100 mg by mouth in the morning. Vitamin B-1    . triamcinolone cream (KENALOG) 0.1 % Apply 1 application topically 2 (two) times daily as needed (dry skin Itching).   3  . venlafaxine (EFFEXOR) 75  MG tablet Take 75 mg by mouth 3 (three) times daily with meals.     . vitamin B-12 (CYANOCOBALAMIN) 1000 MCG tablet Take 1,000 mcg by mouth daily.    . Alcohol Swabs (ALCOHOL  PREP) 70 % PADS Apply 1 each topically 3 (three) times daily.    [name] NOVOFINE 32G X 6 MM MISC Inject into the skin.     No current facility-administered medications for this visit.    Allergies as of 02/22/2020 - Review Complete 02/22/2020  Allergen Reaction Noted  . Chlorhexidine  09/19/2019  . Tape Itching and Rash 09/18/2019    Family History  Problem Relation Age of Onset  . Colon cancer Father 29       deceased age 29  . Breast cancer Sister        deceased  . Diabetes Sister   . Stroke Mother   . Healthy Son   . Healthy Daughter   . Lung cancer Neg Hx   . Ovarian cancer Neg Hx     Social History   Socioeconomic History  . Marital status: Married    Spouse name: Not on file  . Number of children: 2  . Years of education: Not on file  . Highest education level: Not on file  Occupational History  . Occupation: RETIRED    Employer: SELF EMPLOYED  Tobacco Use  . Smoking status: Former Smoker    Packs/day: 2.00    Years: 25.00    Pack years: 50.00    Types: Cigarettes    Quit date: 01/28/1986    Years since quitting: 34.0  . Smokeless tobacco: Never Used  Vaping Use  . Vaping Use: Never used  Substance and Sexual Activity  . Alcohol use: No    Comment: quit in 05/2013  . Drug use: No  . Sexual activity: Not Currently  Other Topics Concern  . Not on file  Social History Narrative   Two step children from second marriage (divorced) who live with him along with some grandchildren.    Social Determinants of Health   Financial Resource Strain:   . Difficulty of Paying Living Expenses: Not on file  Food Insecurity: No Food Insecurity  . Worried About Charity fundraiser in the Last Year: Never true  . Ran Out of Food in the Last Year: Never true  Transportation Needs: No Transportation Needs  . Lack of Transportation (Medical): No  . Lack of Transportation (Non-Medical): No  Physical Activity:   . Days of Exercise per Week: Not on file  . Minutes of  Exercise per Session: Not on file  Stress:   . Feeling of Stress : Not on file  Social Connections:   . Frequency of Communication with Friends and Family: Not on file  . Frequency of Social Gatherings with Friends and Family: Not on file  . Attends Religious Services: Not on file  . Active Member of Clubs or Organizations: Not on file  . Attends Archivist Meetings: Not on file  . Marital Status: Not on file    Review of Systems: Gen: Denies fever, chills, cold-like symptoms, presyncope, syncope. CV: Denies chest pain or heart palpitations. Resp: See HPI GI: See HPI Heme: See HPI  Physical Exam: BP (!) 113/58   Pulse 70   Temp 97.6 F (36.4 C) (Temporal)   Ht 5\' 7"  (1.702 m)   BMI 36.02 kg/m  General:   Drowsy but easily aroused.  Falling asleep while talking.  He  is oriented x4.  Able to answer questions appropriately.  No distress noted.  In wheelchair. Head:  Normocephalic and atraumatic. Bruising noted around right eye and along right jaw line.  Eyes:  Conjuctiva clear without scleral icterus. Heart:  S1, S2 present without murmurs appreciated. Lungs: Rales noted in left lower lung field.  No rhonchi or wheezes.  No distress.  Abdomen:  +BS, obese, mild distention but non-tense and non-tender to deep palpation.  I was able to reproduce the described right upper quadrant pain by lightly rubbing over the skin of the right upper quadrant.  No rebound or guarding. No HSM or masses noted. Msk:  Symmetrical without gross deformities. Extremities:  Without 2+ bilateral LE pitting edema up to the knees.  Neurologic: Drowsy, falling asleep during encounter, but easily aroused and oriented x4. Psych:  Normal mood and affect.

## 2020-02-22 ENCOUNTER — Encounter: Payer: Self-pay | Admitting: Gastroenterology

## 2020-02-22 ENCOUNTER — Other Ambulatory Visit: Payer: Self-pay

## 2020-02-22 ENCOUNTER — Ambulatory Visit: Payer: Medicare Other | Admitting: Gastroenterology

## 2020-02-22 VITALS — BP 113/58 | HR 70 | Temp 97.6°F | Ht 67.0 in

## 2020-02-22 DIAGNOSIS — R1011 Right upper quadrant pain: Secondary | ICD-10-CM | POA: Diagnosis not present

## 2020-02-22 DIAGNOSIS — R82998 Other abnormal findings in urine: Secondary | ICD-10-CM

## 2020-02-22 DIAGNOSIS — K7030 Alcoholic cirrhosis of liver without ascites: Secondary | ICD-10-CM | POA: Diagnosis not present

## 2020-02-22 DIAGNOSIS — R1010 Upper abdominal pain, unspecified: Secondary | ICD-10-CM | POA: Insufficient documentation

## 2020-02-22 NOTE — Assessment & Plan Note (Addendum)
74 y.o. male with history of alcoholic cirrhosis (no alcohol since 2014), MLED 29 (accounting for dialysis in the setting of ESRD), no interest in liver transplant previously, recurrent hepatic encephalopathy due to medication noncompliance, SBP last year unable to take antibiotic prophylaxis due to recurrent c diff, portal hypertensive gastropathy, grade 1/2 esophageal varices with EGD up-to-date in April 2021, not on selective BB due increased risk of SBP.  It was difficult getting a history from patient today as he took a muscle relaxer prior to his visit and fell asleep several times while talking with me. Per patients wife, this is how he is every time he takes a muscle relaxer. She states he has not had any confusion since his discharge on 9/11. Taking lactulose 27mL daily to BID with 2 loose/watery BMs most days. He continues on Xifaxan BID. Diuretics are managed by nephrology and volume status seems to be improved compared to last visit. Abdomen is obese with mild distension, but is non-tense. Doesn't feel he needs another para at this time. No overt GI bleeding.   Plan:  1. Reinforced the importance of titrating lactulose to 3-4 BMs daily to prevent hepatic encephalopathy.  Patient's wife voiced understanding.  Advised if they were going out, he still needs to take lactulose and may try taking a lower dose of 15 mL as he is currently not taking anything if he plans to be away from home. He should return the normal dosing when he returns home.  2.  Continue rifaximin 550 mg twice daily. 3. Monitor for any worsening confusion or lethargy and let us know if this occurs.  4.Limit use of muscle relaxer's are they are causing significant drowsiness.  5. Diuretic management per dialysis.  Reinforced importance of going to all dialysis appointments.  6. Continue low sodium diet. No more than 2000 mg per day.  7. Patient states he has a lab appointment with PCP next week.  We will request lab results to  be faxed to [place]. 8. Abdominal imaging up to date. Will be due for [place] in March 2022.  9. Repeat EGD in April 2022.  10. Close follow-up in 6-8 weeks with Dr. Gala Romney.

## 2020-02-22 NOTE — Patient Instructions (Addendum)
Please discuss your right sided abdominal pain with your PCP next week. Your symptoms seem more consistent with a musculoskeletal or neuropathic pain rather than symptoms secondary to you gallbladder or stomach.   Continue to monitor the abdominal pain and let me know if you have any worsening or if you start having symptoms associated with meals and we will evaluate further.   Continue taking omeprazole 40 mg daily.  Be sure to avoid all NSAID products including ibuprofen, Aleve, Advil, Goody powders naproxen, and anything her physical incident from the package.  Continue taking lactulose 30 mL twice daily at minimum.  The goal is for you to have 3-4 bowel movements daily to prevent confusion secondary to your cirrhosis.  If you are going out and do not want to take 30 mL, you may try taking 15 mL instead.  It is very important that you take this medication every day.  Monitor for any worsening confusion or lethargy and let us know if this occurs.   Limit use of muscle relaxer's are they are making you very drowsy.   Continue following with dialysis and going to all your appointments.  We will have you return for follow-up in 6-8 weeks with Dr. Gala Romney.  [name], PA-C [facility]

## 2020-02-22 NOTE — Assessment & Plan Note (Addendum)
74 y.o. male with history of ETOH cirrhosis (MELD Na 29, accounting for dialysis) , ESRD on dialysis, transfusion dependent anemia, SBP last year, and recurrent hepatic encephalopathy due to medication non-compliance reporting 4+ week history of daily intermittent RUQ pain. No association with meals or BMs. Notes there is a numbness in the RUQ and the pain occurs when his shirt rubs across the area. On exam, patient has an obese abdomen that is non-tense with no tenderness to deep palpation of entire abdomen. He had reproducible pain when I rubbed lightly over the RUQ. No rash noted. EGD completed in April this year with moderate gastritis without H. Pylori and a single hyperplastic polyp. He remains on omeprazole 40 mg daily without GERD symptoms, nausea, or vomiting. Recent CT 02/08/20 with cholelithiasis without cholelithiasis.   Suspect MSK/neuropathic pain with possible referral from his back as he notes chronic back pain.   I have advised he discuss this further with his PCP and to continue to monitor. If he has any worsening symptoms or association with meals, he was advised to let us know. Could consider HIDA as he does have gallstones, but symptoms are not consistent with biliary etiology at this time. Symptoms are also not consistent with SBP, gastritis, or PUD. He will continue omeprazole daily and continue to avoid all NSAIDs.

## 2020-02-22 NOTE — Assessment & Plan Note (Signed)
UA on 9/20 with large leukocytes and rare bacteria. Patient has indwelling urinary catheter. Recommended repeating UA and urine culture. He prefers to hold off until seeing his PCP on Tuesday as they are also completing labs at that time.

## 2020-02-23 DIAGNOSIS — N186 End stage renal disease: Secondary | ICD-10-CM | POA: Diagnosis not present

## 2020-02-23 DIAGNOSIS — R509 Fever, unspecified: Secondary | ICD-10-CM | POA: Diagnosis not present

## 2020-02-23 DIAGNOSIS — Z992 Dependence on renal dialysis: Secondary | ICD-10-CM | POA: Diagnosis not present

## 2020-02-23 DIAGNOSIS — T82898A Other specified complication of vascular prosthetic devices, implants and grafts, initial encounter: Secondary | ICD-10-CM | POA: Diagnosis not present

## 2020-02-26 ENCOUNTER — Other Ambulatory Visit: Payer: Self-pay | Admitting: *Deleted

## 2020-02-26 DIAGNOSIS — Z992 Dependence on renal dialysis: Secondary | ICD-10-CM | POA: Diagnosis not present

## 2020-02-26 DIAGNOSIS — R69 Illness, unspecified: Secondary | ICD-10-CM | POA: Diagnosis not present

## 2020-02-26 DIAGNOSIS — N186 End stage renal disease: Secondary | ICD-10-CM | POA: Diagnosis not present

## 2020-02-26 DIAGNOSIS — E119 Type 2 diabetes mellitus without complications: Secondary | ICD-10-CM | POA: Diagnosis not present

## 2020-02-26 DIAGNOSIS — R5381 Other malaise: Secondary | ICD-10-CM | POA: Diagnosis not present

## 2020-02-26 DIAGNOSIS — I509 Heart failure, unspecified: Secondary | ICD-10-CM | POA: Diagnosis not present

## 2020-02-26 DIAGNOSIS — R509 Fever, unspecified: Secondary | ICD-10-CM | POA: Diagnosis not present

## 2020-02-26 DIAGNOSIS — Z794 Long term (current) use of insulin: Secondary | ICD-10-CM | POA: Diagnosis not present

## 2020-02-26 DIAGNOSIS — R0602 Shortness of breath: Secondary | ICD-10-CM | POA: Diagnosis not present

## 2020-02-26 DIAGNOSIS — D509 Iron deficiency anemia, unspecified: Secondary | ICD-10-CM | POA: Diagnosis not present

## 2020-02-26 DIAGNOSIS — T82898A Other specified complication of vascular prosthetic devices, implants and grafts, initial encounter: Secondary | ICD-10-CM | POA: Diagnosis not present

## 2020-02-26 NOTE — Patient Outreach (Signed)
[facility]) Care Management  02/26/2020  Jeffrey [name] [birth year] [id number]   RN received a call back from [facility] indicating based upon the pt's residence the agency would not be able to services this pt in [place] for Palliative services. Spouse [name] made aware.   Raina Mina, RN Care Management Coordinator [place] Office [phone]

## 2020-02-26 NOTE — Patient Outreach (Signed)
[facility]) Care Management  02/26/2020  Jeffrey [name] 1945-08-07 [id number]   Telephone Assessment-Post ED visit (Fall)  RN spoke with wife Jeffrey [name] who explains pt is very weak with his legs and on many occassions with ambulating he may have sit on the floor due to his weak legs. States she has to call the the fire dept to assist with getting him up and back in bed. Stress the safety of bedside commode or eliminating ambulation until pt has regained his strength. Reports [facility] has completed services with the therapist so no additional therapy. Pt continue to go to dialysis M-W-F. RN attempted to discuss other services that may benefit the pt at his current state. Wife very receptive to [place] and states the social worker Olivia Mackie was suppose to make a referral. RN offered to follow up and contact this agency with her provider for possible services. Wife very receptive and grateful for the follow up. Wife indicates a pending appointment with the pt's primary provider on tomorrow. Stress the importance of having a conversation about [facility] and inform the provider this is a services that pt has agreed to engage with for case management services.   Will make a referral to [facility] and follow up in a few weeks with spouse on pt's ongoing progress and management of care.  Called [facility] and made a referral to Manus Gunning concerning pt's needs. Provided necessary information for the referral along with the provider's name and number to expedite services based upon the pt's needs.   No other needs presented at this time. Will follow up in a few weeks for ongoing [facility] resources and services.  Raina Mina, RN Care Management Coordinator [place] Office [phone]

## 2020-02-27 DIAGNOSIS — E119 Type 2 diabetes mellitus without complications: Secondary | ICD-10-CM | POA: Diagnosis not present

## 2020-02-27 DIAGNOSIS — Z299 Encounter for prophylactic measures, unspecified: Secondary | ICD-10-CM | POA: Diagnosis not present

## 2020-02-27 DIAGNOSIS — E1165 Type 2 diabetes mellitus with hyperglycemia: Secondary | ICD-10-CM | POA: Diagnosis not present

## 2020-02-27 DIAGNOSIS — Z7189 Other specified counseling: Secondary | ICD-10-CM | POA: Diagnosis not present

## 2020-02-27 DIAGNOSIS — Z0000 Encounter for general adult medical examination without abnormal findings: Secondary | ICD-10-CM | POA: Diagnosis not present

## 2020-02-28 DIAGNOSIS — Z992 Dependence on renal dialysis: Secondary | ICD-10-CM | POA: Diagnosis not present

## 2020-02-28 DIAGNOSIS — N186 End stage renal disease: Secondary | ICD-10-CM | POA: Diagnosis not present

## 2020-02-28 DIAGNOSIS — T82898A Other specified complication of vascular prosthetic devices, implants and grafts, initial encounter: Secondary | ICD-10-CM | POA: Diagnosis not present

## 2020-02-28 DIAGNOSIS — R509 Fever, unspecified: Secondary | ICD-10-CM | POA: Diagnosis not present

## 2020-02-29 ENCOUNTER — Other Ambulatory Visit: Payer: Self-pay | Admitting: Gastroenterology

## 2020-02-29 DIAGNOSIS — N186 End stage renal disease: Secondary | ICD-10-CM | POA: Diagnosis not present

## 2020-02-29 DIAGNOSIS — Z992 Dependence on renal dialysis: Secondary | ICD-10-CM | POA: Diagnosis not present

## 2020-03-01 DIAGNOSIS — N186 End stage renal disease: Secondary | ICD-10-CM | POA: Diagnosis not present

## 2020-03-01 DIAGNOSIS — Z992 Dependence on renal dialysis: Secondary | ICD-10-CM | POA: Diagnosis not present

## 2020-03-04 ENCOUNTER — Telehealth: Payer: Self-pay | Admitting: Gastroenterology

## 2020-03-04 DIAGNOSIS — Z992 Dependence on renal dialysis: Secondary | ICD-10-CM | POA: Diagnosis not present

## 2020-03-04 DIAGNOSIS — N186 End stage renal disease: Secondary | ICD-10-CM | POA: Diagnosis not present

## 2020-03-04 NOTE — Telephone Encounter (Signed)
Jeffrey [name], patient reported he would have labs completed with PCP the week of 9/27. Can we request recent labs from PCP?

## 2020-03-05 ENCOUNTER — Ambulatory Visit: Payer: Medicare Other | Admitting: *Deleted

## 2020-03-05 NOTE — Telephone Encounter (Signed)
requested

## 2020-03-06 DIAGNOSIS — Z992 Dependence on renal dialysis: Secondary | ICD-10-CM | POA: Diagnosis not present

## 2020-03-06 DIAGNOSIS — N186 End stage renal disease: Secondary | ICD-10-CM | POA: Diagnosis not present

## 2020-03-08 DIAGNOSIS — N186 End stage renal disease: Secondary | ICD-10-CM | POA: Diagnosis not present

## 2020-03-08 DIAGNOSIS — Z992 Dependence on renal dialysis: Secondary | ICD-10-CM | POA: Diagnosis not present

## 2020-03-08 DIAGNOSIS — E878 Other disorders of electrolyte and fluid balance, not elsewhere classified: Secondary | ICD-10-CM | POA: Diagnosis not present

## 2020-03-11 DIAGNOSIS — Z992 Dependence on renal dialysis: Secondary | ICD-10-CM | POA: Diagnosis not present

## 2020-03-11 DIAGNOSIS — N2581 Secondary hyperparathyroidism of renal origin: Secondary | ICD-10-CM | POA: Diagnosis not present

## 2020-03-11 DIAGNOSIS — N186 End stage renal disease: Secondary | ICD-10-CM | POA: Diagnosis not present

## 2020-03-12 ENCOUNTER — Ambulatory Visit: Payer: Medicare Other

## 2020-03-13 DIAGNOSIS — N186 End stage renal disease: Secondary | ICD-10-CM | POA: Diagnosis not present

## 2020-03-13 DIAGNOSIS — Z992 Dependence on renal dialysis: Secondary | ICD-10-CM | POA: Diagnosis not present

## 2020-03-14 ENCOUNTER — Ambulatory Visit (INDEPENDENT_AMBULATORY_CARE_PROVIDER_SITE_OTHER): Payer: Medicare Other

## 2020-03-14 ENCOUNTER — Other Ambulatory Visit: Payer: Self-pay

## 2020-03-14 ENCOUNTER — Telehealth: Payer: Self-pay

## 2020-03-14 ENCOUNTER — Other Ambulatory Visit: Payer: Self-pay | Admitting: *Deleted

## 2020-03-14 DIAGNOSIS — R339 Retention of urine, unspecified: Secondary | ICD-10-CM | POA: Diagnosis not present

## 2020-03-14 NOTE — Telephone Encounter (Signed)
Opened in error

## 2020-03-14 NOTE — Progress Notes (Signed)
Cath Change/ Replacement  Patient is present today for a catheter change due to urinary retention.  87ml of water was removed from the balloon, a 16FR foley cath was removed with out difficulty.  Patient was cleaned and prepped in a sterile fashion with betadine. A 16 FR foley cath was replaced into the bladder no complications were noted Urine return was noted 34ml and urine was yellow in color. The balloon was filled with 22ml of sterile water. A leg bag was attached for drainage.   Patient was given proper instruction on catheter care.    Performed by: Valentina Lucks, LPN  Follow up:  1 month

## 2020-03-14 NOTE — Patient Outreach (Signed)
[facility] [facility]) Care Management  03/14/2020  Jeffrey [name] 10-Feb-1946 [id number]   Telephone Assessment-requested a call back  RN spoke with the pt's spouse who indicated pt has a urologist appointment and requested a call back.  Will rescheduled and follow up once again next week.  Raina Mina, RN Care Management Coordinator [place] Office [phone]

## 2020-03-15 DIAGNOSIS — Z992 Dependence on renal dialysis: Secondary | ICD-10-CM | POA: Diagnosis not present

## 2020-03-15 DIAGNOSIS — N186 End stage renal disease: Secondary | ICD-10-CM | POA: Diagnosis not present

## 2020-03-18 DIAGNOSIS — Z992 Dependence on renal dialysis: Secondary | ICD-10-CM | POA: Diagnosis not present

## 2020-03-18 DIAGNOSIS — E878 Other disorders of electrolyte and fluid balance, not elsewhere classified: Secondary | ICD-10-CM | POA: Diagnosis not present

## 2020-03-18 DIAGNOSIS — N186 End stage renal disease: Secondary | ICD-10-CM | POA: Diagnosis not present

## 2020-03-19 ENCOUNTER — Other Ambulatory Visit: Payer: Self-pay | Admitting: *Deleted

## 2020-03-19 NOTE — Patient Outreach (Addendum)
[facility] [facility]) Care Management  03/19/2020  KONNER SAIZ [birth year] [id number]   Telephone Assessment-Unsuccessful-DM  RN attempted outreach call today however unsuccessful. RN able to leave a HIPAA approved voice message requesting a call back.  Will continue outreach attempts with another follow up call over the next week. Will also send outreach letter since the last call was also unsuccessful.  Raina Mina, RN Care Management Coordinator [place] Office [phone]

## 2020-03-20 DIAGNOSIS — N186 End stage renal disease: Secondary | ICD-10-CM | POA: Diagnosis not present

## 2020-03-20 DIAGNOSIS — Z992 Dependence on renal dialysis: Secondary | ICD-10-CM | POA: Diagnosis not present

## 2020-03-22 DIAGNOSIS — E1165 Type 2 diabetes mellitus with hyperglycemia: Secondary | ICD-10-CM | POA: Diagnosis not present

## 2020-03-22 DIAGNOSIS — Z299 Encounter for prophylactic measures, unspecified: Secondary | ICD-10-CM | POA: Diagnosis not present

## 2020-03-22 DIAGNOSIS — E1122 Type 2 diabetes mellitus with diabetic chronic kidney disease: Secondary | ICD-10-CM | POA: Diagnosis not present

## 2020-03-22 DIAGNOSIS — N186 End stage renal disease: Secondary | ICD-10-CM | POA: Diagnosis not present

## 2020-03-22 DIAGNOSIS — Z992 Dependence on renal dialysis: Secondary | ICD-10-CM | POA: Diagnosis not present

## 2020-03-22 DIAGNOSIS — K921 Melena: Secondary | ICD-10-CM | POA: Diagnosis not present

## 2020-03-24 NOTE — Telephone Encounter (Signed)
Received and reviewed labs completed with PCP 02/27/2020 Hemoglobin A1c 6.2.  No other labs completed. I had requested these for review/to have on file.    At his last visit, we had discussed large number of WBCs in his urinalysis on 9/21 and patient preferred to hold on completing UA and urine culture as he would have this completed with PCP. I see he underwent urinary catheter exchange on 10/14. He can follow-up with urology and/or PCP on this if needed. No additional recommendations.   He has follow-up with our office arranged 04/09/2020.

## 2020-03-25 DIAGNOSIS — Z992 Dependence on renal dialysis: Secondary | ICD-10-CM | POA: Diagnosis not present

## 2020-03-25 DIAGNOSIS — N186 End stage renal disease: Secondary | ICD-10-CM | POA: Diagnosis not present

## 2020-03-25 NOTE — Telephone Encounter (Signed)
Spoke with pts spouse. She is aware that labs were reviewed and pt can f/u with urology and/or PCP. Pt has been in touch with pts PCP Friday 03/22/20. Pt had blood work and Dialysis completed Friday and pts Hemoglobin was 8.2. pt has been having some rectal bleeding(bright red blood) 6 times last week. [name] asked pt to go to [facility] on Friday and pt doesn't like to sit at the waiting room. Pts spouse called [name] this morning 03/25/20 and discussed pt still having blood in hs stool and urine. Pt is scheduled to have labs and an iron infusion at [facility] Hematology department this Thursday 03/28/20.

## 2020-03-25 NOTE — Telephone Encounter (Signed)
Spoke with patient's spouse. Patient is currently at dialysis. Spouse reports rectal bleeding Monday, Wednesday, and Friday last week as well as Sunday and Today. Typically with bleeding with each BM which is 2-3 times a day. Stools are soft. Toilet water is red and when spouse wipes patient, there is bright red blood on toilet tissue with clots of blood as well. Feels the amount of blood this morning was a little less than it has been. Prior to last week, last episode of rectal bleeding was in January this year when he had C. diff colitis.  No rectal pain, melena, abdominal pain, nausea, or vomiting. Patient is chronically fatigued and hasn't noticed any change in this. Chronic lightheadedness with quick position changes without worsening.    Also with hematuria "a couple of times" per spouse. Has indwelling foley catheter.   I have advised that patient proceed to the emergency room for additional evaluation of ongoing rectal bleeding in the setting of cirrhosis. He needs updated labs and should be started on prophylactic antibiotics for SBP. Less likely upper GI bleed, but can't rule this out. Could have a stuttering diverticular bleed. No mention of hemorrhoids on colonoscopy. He does have known diverticulosis. Also with polyps that were not removed on last colonoscopy in January 2021 (see care everywhere).  Patient's wife will discuss my recommendations with patient when he gets out of dialysis. If patient doesn't go to the emergency room, I have asked for patient's wife to call our office back in the morning as we would need to get STAT labs.

## 2020-03-26 ENCOUNTER — Telehealth: Payer: Self-pay | Admitting: Internal Medicine

## 2020-03-26 ENCOUNTER — Other Ambulatory Visit: Payer: Self-pay | Admitting: *Deleted

## 2020-03-26 ENCOUNTER — Emergency Department (HOSPITAL_COMMUNITY)
Admission: EM | Admit: 2020-03-26 | Discharge: 2020-03-26 | Disposition: A | Discharge status: Home / Self Care | Payer: Medicare Other | Attending: Emergency Medicine | Admitting: Emergency Medicine

## 2020-03-26 ENCOUNTER — Other Ambulatory Visit: Payer: Self-pay

## 2020-03-26 ENCOUNTER — Encounter (HOSPITAL_COMMUNITY): Payer: Self-pay | Admitting: Emergency Medicine

## 2020-03-26 DIAGNOSIS — K7030 Alcoholic cirrhosis of liver without ascites: Secondary | ICD-10-CM | POA: Insufficient documentation

## 2020-03-26 DIAGNOSIS — E119 Type 2 diabetes mellitus without complications: Secondary | ICD-10-CM | POA: Insufficient documentation

## 2020-03-26 DIAGNOSIS — Z992 Dependence on renal dialysis: Secondary | ICD-10-CM | POA: Diagnosis not present

## 2020-03-26 DIAGNOSIS — Z87891 Personal history of nicotine dependence: Secondary | ICD-10-CM | POA: Insufficient documentation

## 2020-03-26 DIAGNOSIS — I1311 Hypertensive heart and chronic kidney disease without heart failure, with stage 5 chronic kidney disease, or end stage renal disease: Secondary | ICD-10-CM | POA: Insufficient documentation

## 2020-03-26 DIAGNOSIS — D649 Anemia, unspecified: Secondary | ICD-10-CM | POA: Diagnosis not present

## 2020-03-26 DIAGNOSIS — Z79899 Other long term (current) drug therapy: Secondary | ICD-10-CM | POA: Diagnosis not present

## 2020-03-26 DIAGNOSIS — N186 End stage renal disease: Secondary | ICD-10-CM | POA: Diagnosis not present

## 2020-03-26 DIAGNOSIS — K625 Hemorrhage of anus and rectum: Secondary | ICD-10-CM

## 2020-03-26 DIAGNOSIS — Z794 Long term (current) use of insulin: Secondary | ICD-10-CM | POA: Insufficient documentation

## 2020-03-26 LAB — CBC
HCT: 28.9 % — ABNORMAL LOW (ref 39.0–52.0)
Hemoglobin: 8.8 g/dL — ABNORMAL LOW (ref 13.0–17.0)
MCH: 30.3 pg (ref 26.0–34.0)
MCHC: 30.4 g/dL (ref 30.0–36.0)
MCV: 99.7 fL (ref 80.0–100.0)
Platelets: 161 10*3/uL (ref 150–400)
RBC: 2.9 MIL/uL — ABNORMAL LOW (ref 4.22–5.81)
RDW: 17.2 % — ABNORMAL HIGH (ref 11.5–15.5)
WBC: 3.7 10*3/uL — ABNORMAL LOW (ref 4.0–10.5)
nRBC: 0 % (ref 0.0–0.2)

## 2020-03-26 LAB — COMPREHENSIVE METABOLIC PANEL
ALT: 31 U/L (ref 0–44)
AST: 31 U/L (ref 15–41)
Albumin: 2.4 g/dL — ABNORMAL LOW (ref 3.5–5.0)
Alkaline Phosphatase: 229 U/L — ABNORMAL HIGH (ref 38–126)
Anion gap: 11 (ref 5–15)
BUN: 24 mg/dL — ABNORMAL HIGH (ref 8–23)
CO2: 24 mmol/L (ref 22–32)
Calcium: 8.4 mg/dL — ABNORMAL LOW (ref 8.9–10.3)
Chloride: 93 mmol/L — ABNORMAL LOW (ref 98–111)
Creatinine, Ser: 2.82 mg/dL — ABNORMAL HIGH (ref 0.61–1.24)
GFR, Estimated: 23 mL/min — ABNORMAL LOW (ref >60)
Glucose, Bld: 213 mg/dL — ABNORMAL HIGH (ref 70–99)
Potassium: 3.6 mmol/L (ref 3.5–5.1)
Sodium: 128 mmol/L — ABNORMAL LOW (ref 135–145)
Total Bilirubin: 0.7 mg/dL (ref 0.3–1.2)
Total Protein: 7.3 g/dL (ref 6.5–8.1)

## 2020-03-26 LAB — POC OCCULT BLOOD, ED: Fecal Occult Bld: POSITIVE — AB

## 2020-03-26 LAB — PROTIME-INR
INR: 1.2 (ref 0.8–1.2)
Prothrombin Time: 14.4 seconds (ref 11.4–15.2)

## 2020-03-26 LAB — HEMOGLOBIN AND HEMATOCRIT, BLOOD
HCT: 25.9 % — ABNORMAL LOW (ref 38.5–50.0)
Hemoglobin: 8.2 g/dL — ABNORMAL LOW (ref 13.2–17.1)

## 2020-03-26 LAB — TYPE AND SCREEN
ABO/RH(D): O POS
Antibody Screen: NEGATIVE

## 2020-03-26 LAB — LIPASE, BLOOD: Lipase: 19 U/L (ref 11–51)

## 2020-03-26 LAB — APTT: aPTT: 40 seconds — ABNORMAL HIGH (ref 24–36)

## 2020-03-26 NOTE — Discharge Instructions (Signed)
Please follow up with Dr. Almetta Lovely tomorrow regarding your ED visit. They will touch base with you regarding the need for colonoscopy in the near future.   Return to the ED for any worsening symptoms including severe abdominal pain, fevers > 100.4, excessive vomiting, passing out, dark black stool, or any other new/concerning symptoms.

## 2020-03-26 NOTE — Telephone Encounter (Signed)
Called to update Wife that labs will not result until tomorrow. Stated she is working and will be home in a couple of hours and will try to convince patient to go to the emergency room.   She gave me a number to reach patient at: [phone].   Spoke with patient. Also reported rectal bleeding for the last 1.5 weeks passing only blood per rectum this morning. He is agreeable to proceed to the emergency room.   Updated wife who stated she would leave work and take patient to the emergency room.

## 2020-03-26 NOTE — Telephone Encounter (Signed)
Noted  

## 2020-03-26 NOTE — Telephone Encounter (Signed)
Pt's spouse called this morning. Pt didn't go to the ED and is asking for STAT labs.

## 2020-03-26 NOTE — Telephone Encounter (Signed)
Pt's wife called and wanted [name], [place] to know that they were leaving [facility] and going to [facility].

## 2020-03-26 NOTE — ED Provider Notes (Signed)
[facility] EMERGENCY DEPARTMENT Provider Note   CSN: [id number] Arrival date & time: 03/26/20  1604     History Chief Complaint  Patient presents with  . GI Bleeding  . Blood In Stools    Jeffrey [name] is a 74 y.o. male with PMHx Diabetes, HTN, ESRD on Dialysis MWF, alcoholic liver cirrhosis who presents to the ED today with complaint of BRBPR x 1.5 weeks. Wife reports that pt has also been having some clots as well mixed into the stool however this morning pt had only blood and no stool causing concern. Wife called pt's GI doctor yesterday and was advised to come to the ED for further evaluation however they wanted to hold off and have labs done in the outpatient setting. Wife took pt to [facility] today to have labs done without GI ordering them and was ultimately again advised to come to the ED as the lab would not result until tomorrow. Pt mentions he has intermittent abdominal pain from his cirrhosis and states that he does not believe the pain is any worse than normal. He denies any fevers or chills at home. No nausea, vomiting, diarrhea, or any other associated symptoms. Pt does mention he feels like there is more fluid on his abdomen than normal; he has not had a paracentesis in several months.   Per chart review: Telephone encounter today by [name] PA-C with gastroenterology who recommended pt come to the ED with concern for possible SBP.   The history is provided by the patient and medical records.       Past Medical History:  Diagnosis Date  . A-fib [facility])    when initially stating dialysis january 2021 at [facility] per spouse; never heard anything else about it  . Alcoholic cirrhosis ([place])    patient reports completing hep a and b vaccines in 2006  . Anemia    has had 3 units of prbcs 2013  . Anxiety   . Arthritis   . Asymptomatic gallstones    Ultrasound in 2006  . C. difficile colitis   . Cholelithiasis   . Chronic kidney disease    Dialysis  M/W/F/Sa  . Depression   . Diabetes ([place])   . Duodenal ulcer with hemorrhage    per patient in 2006 or 2007, records have been requested  . Dyspnea    low oxygen level -  has O2 2 L all day  . 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 GERD (gastroesophageal reflux disease)   . Heart burn   . 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 HOH (hard of hearing)   . Hypertension   . Liver cirrhosis ([place])   . Splenomegaly    Ultrasound in 2006    Patient Active Problem List   Diagnosis Date Noted  . Right upper quadrant abdominal pain 02/22/2020  . Urine leukocytes increased 02/22/2020  . Fall   . Minor head injury   . Chronic diastolic HF (heart failure) ([place])   . Physical deconditioning   . Acute hyponatremia 02/19/2020  . Fever and chills 02/09/2020  . Advanced care planning/counseling discussion   . DNR (do not resuscitate)   . Alcoholic cirrhosis of liver ([place]) 02/08/2020  . Back pain 02/08/2020  . Status post thoracentesis   . Hepatic encephalopathy ([place]) 01/01/2020  . Bilateral pleural effusion 12/31/2019  . Hypoglycemia 12/31/2019  . Hyperammonemia ([place]) 12/31/2019  . Obesity (BMI 30-39.9) 12/31/2019  .  Hypoalbuminemia 12/31/2019  . Nosebleed   . Goals of care, counseling/discussion   . Palliative care by specialist   . Acute hepatic encephalopathy 12/13/2019  . Encephalopathy acute   . Hyperkalemia   . Chronic hyponatremia   . Anemia 11/16/2019  . Acute blood loss anemia 09/20/2019  . Gastrointestinal hemorrhage   . Acute on chronic anemia 09/18/2019  . Generalized weakness 09/18/2019  . Type 2 diabetes mellitus ([place]) 09/18/2019  . 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[place]) 09/18/2019  . Enteritis due to Clostridium difficile 08/31/2019  . Edema 04/11/2019  . Alcoholic cirrhosis ([place]) 40/97/3532  . Type 2 diabetes mellitus with stage 3 chronic kidney disease, with long-term current use of insulin ([place]) 10/20/2017  . Essential hypertension, benign 10/20/2017  .  Morbid obesity ([place]) 10/20/2017  . Mixed hyperlipidemia 10/20/2017  . Ascites   . SBP (spontaneous bacterial peritonitis) ([place]) 01/12/2016  . Shigella dysenteriae 01/11/2016  . Diarrhea 01/10/2016  . Chronic liver disease and cirrhosis ([place]) 09/05/2012  . Alcoholic cirrhosis of liver with ascites ([place]) 03/09/2012  . Heme positive stool 03/09/2012  . Anemia due to chronic blood loss 03/09/2012  . Pancytopenia ([place]) 03/09/2012  . FH: colon cancer 03/09/2012  . Esophageal varices in alcoholic cirrhosis ([place]) 99/24/2683    Past Surgical History:  Procedure Laterality Date  . AGILE CAPSULE N/A 09/20/2019   Procedure: AGILE CAPSULE;  Surgeon: Daneil Dolin, MD;  [facility];  Service: Endoscopy;  Laterality: N/A;  . APPENDECTOMY    . AV FISTULA PLACEMENT Left 08/17/2019   Procedure: ARTERIOVENOUS (AV) FISTULA CREATION VERSES ARTERIOVENOUS GRAFT;  Surgeon: [name], MD;  Location: [place];  Service: Vascular;  Laterality: Left;  . BASCILIC VEIN TRANSPOSITION Left 09/28/2019   Procedure: LEFT SECOND STAGE BASCILIC VEIN TRANSPOSITION;  Surgeon: [name], MD;  Location: [place];  Service: Vascular;  Laterality: Left;  . BIOPSY  02/03/2018   Procedure: BIOPSY;  Surgeon: Daneil Dolin, MD;  [facility];  Service: Endoscopy;;  bx of gastric polyps  . BIOPSY  09/19/2019   Procedure: BIOPSY;  Surgeon: Danie Binder, MD;  [facility];  Service: Endoscopy;;  . CATARACT EXTRACTION Right   . COLONOSCOPY  03/10/2005   Rectal polyp as described above, removed with snare. Left sided  diverticula. The remainder of the colonic mucosa appeared normal. Inflammed polyp on path.  . COLONOSCOPY  04/1999   Dr. Thea Silversmith polyps removed,  Path showed hyperplastic  . COLONOSCOPY  10/2015   Dr. Britta Mccreedy: diverticulosis, single sessile tubular adenoma 3-28m in size removed from descending colon.   . COLONOSCOPY WITH ESOPHAGOGASTRODUODENOSCOPY (EGD)  03/31/2012    RMHD:[id number]. Colonic diverticulosis. tubular adenoma colon  . COLONOSCOPY WITH ESOPHAGOGASTRODUODENOSCOPY (EGD)  06/2019   FORSYTH: small esophageal varices without high risk stigmata, single large AVM on lesser curvature in gastric body s/p APC ablation, gastric antral and duodenal bulb polyposis. No significant source to explain transfusion dependent anemia. Colonoscopy with portal colopathy and diffuse edema, changes of Cdiff colitis on colonoscopy  . ESOPHAGEAL BANDING N/A 02/03/2018   Procedure: ESOPHAGEAL BANDING;  Surgeon: RDaneil Dolin MD;  [facility];  Service: Endoscopy;  Laterality: N/A;  . ESOPHAGOGASTRODUODENOSCOPY  01/15/2005   Three columns grade 1 to 2 esophageal varices, otherwise normal esophageal mucosa.  Esophagus was not manipulated otherwise./Nodularity of the antrum with overlying erosions, nonspecific finding. Path showed rare H.pylori  . ESOPHAGOGASTRODUODENOSCOPY  09/2008   Dr. RGaylene Brookscolumns of  grade 2-3 esoph varices, only one column was prominent. Portal gastropathy, multiple gastrc polyps at antrum, two were 2cm with black eschar, bulbar polyps, bulbar erosions  . ESOPHAGOGASTRODUODENOSCOPY  11/2004   Dr. Brantley Stage 3 esoph varices  . ESOPHAGOGASTRODUODENOSCOPY  03/31/2012   RMR: 4 columns(3-GR 2, 1-GR1) non-bleeding esophageal varices, portal gastropathy, small HH, early GAVE, multiple gastric polyps   . ESOPHAGOGASTRODUODENOSCOPY (EGD) WITH PROPOFOL N/A 02/03/2018   Dr. Gala Romney: Esophageal varices, 3 columns grade 1-2.  Portal hypertensive gastropathy.  Multiple gastric polyps, biopsy consistent with hyperplastic.  [name] ESOPHAGOGASTRODUODENOSCOPY (EGD) WITH PROPOFOL N/A 09/19/2019   Fields: grade I and II esophageal varcies, mild portal hypertensive gastropathy, moderate gastritis but no H. pylori, single hyperplastic gastric polyp removed, obvious source for melena/transfusion dependent anemia not identified, may be due to friable gastric and  duodenal mucosa in the setting of portal hypertension  . GASTRIC VARICES BANDING  03/31/2012   Procedure: GASTRIC VARICES BANDING;  Surgeon: Daneil Dolin, MD;  [facility];  Service: Endoscopy;  Laterality: N/A;  . GIVENS CAPSULE STUDY N/A 09/21/2019   Poor study with a lot of debris obstructing much of the view of the first approximate 3 hours out of 6 hours.  No obvious source of bleeding identified.  Sequela of bleeding and old blood seen in the form of"whisps" of blood, blood flecks mostly toward the end of the study.  . IR REMOVAL TUN CV CATH W/O FL  02/15/2020  . POLYPECTOMY  09/19/2019   Procedure: POLYPECTOMY;  Surgeon: Danie Binder, MD;  [facility];  Service: Endoscopy;;  . UMBILICAL HERNIA REPAIR  2017   exploratory laparotomy, abdominal washout       Family History  Problem Relation Age of Onset  . Colon cancer Father 44       deceased age 72  . Breast cancer Sister        deceased  . Diabetes Sister   . Stroke Mother   . Healthy Son   . Healthy Daughter   . Lung cancer Neg Hx   . Ovarian cancer Neg Hx     Social History   Tobacco Use  . Smoking status: Former Smoker    Packs/day: 2.00    Years: 25.00    Pack years: 50.00    Types: Cigarettes    Quit date: 01/28/1986    Years since quitting: 34.1  . Smokeless tobacco: Never Used  Vaping Use  . Vaping Use: Never used  Substance Use Topics  . Alcohol use: No    Comment: quit in 05/2013  . Drug use: No    Home Medications Prior to Admission medications   Medication Sig Start Date End Date Taking? Authorizing Provider  acetaminophen (TYLENOL) 500 MG tablet Take 500 mg by mouth every 6 (six) hours as needed for mild pain.   Yes [provider]  Alcohol Swabs (ALCOHOL PREP) 70 % PADS Apply 1 each topically 3 (three) times daily. 01/31/20  Yes [provider]  Ascorbic Acid (VITAMIN C) 1000 MG tablet Take 1,000 mg by mouth 2 (two) times daily.   Yes [provider]  diltiazem (CARDIZEM CD) 120 MG 24 hr capsule Take 1 capsule (120 mg total) by mouth daily. 02/10/20 02/09/21 Yes Emokpae, Courage, MD  furosemide (LASIX) 40 MG tablet Take 40 mg by mouth 2 (two) times daily. 12/29/19  Yes [provider]  gabapentin (NEURONTIN) 100 MG capsule TAKE 1 CAPSULE BY MOUTH AT BEDTIME Patient taking  differently: Take 100 mg by mouth at bedtime.  01/30/20  Yes Waynetta Sandy, MD  HYDROcodone-acetaminophen (NORCO/VICODIN) 5-325 MG tablet Take 1 tablet by mouth 2 (two) times daily as needed for moderate pain or severe pain.  01/16/20  Yes [provider]  hydrOXYzine (VISTARIL) 25 MG capsule Take 25 mg by mouth 3 (three) times daily as needed for itching.  01/23/20  Yes [provider]  insulin degludec (TRESIBA FLEXTOUCH) 200 UNIT/ML FlexTouch Pen Inject 120 Units into the skin at bedtime. When blood sugars are up 02/20/20  Yes Barton Dubois, MD  iron polysaccharides (NIFEREX) 150 MG capsule Take 150 mg by mouth at bedtime.    Yes [provider]  lactulose (CHRONULAC) 10 GM/15ML solution Take 45 mLs (30 g total) by mouth 3 (three) times daily. 01/06/20  Yes Barton Dubois, MD  metoprolol succinate (TOPROL-XL) 25 MG 24 hr tablet Take 1 tablet (25 mg total) by mouth daily. Take at bedtime  on Monday ,Wednesday and Friday due to dialaylisis Patient taking differently: Take 25 mg by mouth See admin instructions. Take 1 tablet at bedtime every night and then add 1 tablet in morning on  Sun, Tues, Thurs and Sat 01/06/20  Yes Barton Dubois, MD  milk thistle 175 MG tablet Take 175 mg by mouth 3 (three) times daily.    Yes [provider]  mirtazapine (REMERON) 30 MG tablet Take 1 tablet (30 mg total) by mouth at bedtime. 02/10/20  Yes Roxan Hockey, MD  Multiple Vitamin (MULTIVITAMIN WITH MINERALS) TABS tablet Take 1 tablet by mouth in the morning.    Yes [provider]  nystatin (MYCOSTATIN/NYSTOP) powder  Apply topically 3 (three) times daily. Into groin area and affected area 01/06/20  Yes Barton Dubois, MD  omega-3 acid ethyl esters (LOVAZA) 1 g capsule Take 2 g by mouth 2 (two) times daily.  04/07/19  Yes [provider]  omeprazole (PRILOSEC) 40 MG capsule Take 40 mg by mouth in the morning.    Yes [provider]  ondansetron (ZOFRAN) 4 MG tablet Take 4 mg by mouth See admin instructions. Take 4 mg by mouth every 4 hours as needed   Yes [provider]  oxyCODONE-acetaminophen (PERCOCET/ROXICET) 5-325 MG tablet Take 1 tablet by mouth 2 (two) times daily. 02/27/20  Yes [provider]  pravastatin (PRAVACHOL) 20 MG tablet Take 20 mg by mouth in the morning.    Yes [provider]  Probiotic Product (PROBIOTIC DAILY PO) Take 1 tablet by mouth in the morning.    Yes [provider]  spironolactone (ALDACTONE) 50 MG tablet Take 50 mg by mouth in the morning.    Yes [provider]  tamsulosin (FLOMAX) 0.4 MG CAPS capsule Take 0.4 mg by mouth every evening.   Yes [provider]  thiamine 100 MG tablet Take 100 mg by mouth in the morning. Vitamin B-1   Yes [provider]  tiZANidine (ZANAFLEX) 2 MG tablet Take 2 mg by mouth 2 (two) times daily as needed. 02/29/20  Yes [provider]  triamcinolone cream (KENALOG) 0.1 % Apply 1 application topically 2 (two) times daily as needed (dry skin Itching).  12/30/17  Yes [provider]  venlafaxine (EFFEXOR) 75 MG tablet Take 75 mg by mouth 3 (three) times daily with meals.    Yes [provider]  vitamin B-12 (CYANOCOBALAMIN) 1000 MCG tablet Take 1,000 mcg by mouth daily.   Yes [provider]  XIFAXAN 550 MG  TABS tablet TAKE 1 TABLET BY MOUTH TWICE DAILY 02/29/20  Yes Mahala Menghini, PA-C  NOVOFINE 32G X 6 MM MISC Inject into the skin. 01/31/20   [provider]  thiamine 100 MG tablet Take 100 mg by mouth daily. 02/29/20   [provider]    Allergies    Chlorhexidine and Tape  Review of Systems   Review of Systems  Constitutional: Negative for chills and fever.  Gastrointestinal: Positive for blood in stool. Negative for abdominal pain, constipation, diarrhea, nausea and vomiting.  All other systems reviewed and are negative.   Physical Exam Updated Vital Signs BP 136/61 (BP Location: Right Arm)   Pulse 87   Temp 98.4 F (36.9 C) (Oral)   Resp 20   Ht _0  (1.676 m)   Wt 106.6 kg   SpO2 92%   BMI 37.93 kg/m   Physical Exam Vitals and nursing note reviewed.  Constitutional:      Appearance: He is not ill-appearing or diaphoretic.  HENT:     Head: Normocephalic and atraumatic.  Eyes:     Conjunctiva/sclera: Conjunctivae normal.  Cardiovascular:     Rate and Rhythm: Normal rate and regular rhythm.     Pulses: Normal pulses.  Pulmonary:     Effort: Pulmonary effort is normal.     Breath sounds: Normal breath sounds. No wheezing, rhonchi or rales.  Abdominal:     General: Abdomen is protuberant. Bowel sounds are normal.     Tenderness: There is no abdominal tenderness. There is no guarding or rebound.     Comments: Ascitic abdomen   Genitourinary:    Rectum: Guaiac result positive.     Comments: DRE performed with chaperone at bedside. No external hemorrhoids appreciated. No pain. Light brown stool on gloved finger. Guaiac positive.  Musculoskeletal:     Cervical back: Neck supple.  Skin:    General: Skin is warm and dry.  Neurological:     Mental Status: He is alert.     ED Results / Procedures / Treatments   Labs (all labs ordered are listed, but only abnormal results are displayed) Labs Reviewed  COMPREHENSIVE METABOLIC PANEL - Abnormal; Notable for the following components:      Result Value   Sodium 128 (*)    Chloride 93 (*)    Glucose, Bld 213 (*)    BUN 24 (*)    Creatinine, Ser 2.82 (*)    Calcium 8.4 (*)    Albumin 2.4 (*)    Alkaline Phosphatase 229 (*)     GFR, Estimated 23 (*)    All other components within normal limits  CBC - Abnormal; Notable for the following components:   WBC 3.7 (*)    RBC 2.90 (*)    Hemoglobin 8.8 (*)    HCT 28.9 (*)    RDW 17.2 (*)    All other components within normal limits  APTT - Abnormal; Notable for the following components:   aPTT 40 (*)    All other components within normal limits  POC OCCULT BLOOD, ED - Abnormal; Notable for the following components:   Fecal Occult Bld POSITIVE (*)    All other components within normal limits  LIPASE, BLOOD  PROTIME-INR  POC OCCULT BLOOD, ED  TYPE AND SCREEN    EKG None  Radiology No results found.  Procedures Procedures (including critical care time)  Medications Ordered in ED Medications - No data to display  ED Course  I have  reviewed the triage vital signs and the nursing notes.  Pertinent labs & imaging results that were available during my care of the patient were reviewed by me and considered in my medical decision making (see chart for details).    MDM Rules/Calculators/A&P                          74 year old male with hx of liver cirrhosis who presents to the ED with complaint of BRBPR x 1.5 weeks. Advised to come to the ED by GI today. Pt without any other symptoms including abdominal pain, fevers, chills, nausea, vomiting, diarrhea. On arrival to the ED VSS. Pt is afebrile, nontachycardic, and nontachypneic and overall well appearing. He does have ascites however no abdominal TTP. GU exam performed with light brown stool on gloved finger; no obvious blood however has returned guaiac positive. No melena to suggest UGI. Will plan for labwork including coags and reassessment. There was mention of concern for SBP with telephone encounter by GI PA today however pt does not appear to be infectious at this time. Will continue to monitor. Will plan to touch base with GI once labs results.   CBC without leukocytosis. Hgb stable compared to baseline at  8.8  Hemoglobin  Date Value Ref Range Status  03/26/2020 8.8 (L) 13.0 - 17.0 g/dL Final  03/26/2020 8.2 (L) 13.2 - 17.1 g/dL Final  03/26/2020 8.2 (L) 13.2 - 17.1 g/dL Preliminary  02/20/2020 8.2 (L) 13.0 - 17.0 g/dL Final   CMP with sodium 128 however appears pt is chronically hyponatremic. Creatinine at baseline 2.82. potassium WNL at 3.5. Alk phos elevated however at baseline per patient at 229.  Lipase 19.  INR within normal limits.   Labwork unremarkable today. Attending physician Dr. Sedonia Small has evaluated patient and agrees with plan; pt does not appear to require additional labs, imaging, or work up at this time. Do not believe he requires admission however will consult GI for further recommendations.   Discussed case with Dr. Laural Golden who agrees no need to admit at this time. Pt will need a colonoscopy at some point to assess the bleeding. He will touch base with pt's PA/Dr. Gala Romney to discuss for potential for colonoscopy at the end of this week. Have updated pt and wife on plan. They are in agreement and pt stable for discharge.   This note was prepared using Dragon voice recognition software and may include unintentional dictation errors due to the inherent limitations of voice recognition software.  Final Clinical Impression(s) / ED Diagnoses Final diagnoses:  Bright red rectal bleeding    Rx / DC Orders ED Discharge Orders    None       Discharge Instructions     Please follow up with Dr. Almetta Lovely tomorrow regarding your ED visit. They will touch base with you regarding the need for colonoscopy in the near future.   Return to the ED for any worsening symptoms including severe abdominal pain, fevers > 100.4, excessive vomiting, passing out, dark black stool, or any other new/concerning symptoms.        Eustaquio Maize, PA-C 03/26/20 2045    Maudie Flakes, MD 03/26/20 [phone]

## 2020-03-26 NOTE — Patient Outreach (Signed)
[facility]) Care Management  03/26/2020  CORDARRIUS COAD 1946/01/20 [id number]  Telephone Assessment-Hemologic (Anemia)  Spoke with spouse Jonelle Sidle) today concerning pt's ongoing management of care. States pt is doing well with diabetes with readings at 200 this morning due to the stress with his ongoing bleeding however reports it is usually around 100. Pt continue to attend dialysis but continue to have ongoing issues with bleeding. Pt losing a lot of bleed in his stools that is not constipated (on lactulose) and pt's provider is aware. Pt continue to do well with no other symptoms. Strongly encourage safety due to pt's increase weakness and contact his provider with no other symptoms while awaiting further interventions from his providers. Pt will have an appointment on Thursday for iron therapy and lab work-up to check his readings.   Will convert new plan of care related to anemia due to pt's ongoing and stress the importance of safety due to his ongoing episodes of bleeding. Generated a care plan and discussed all goals and interventions related. Caregiver aware to seek medical attention with any acute bleeding or related symptoms.  Caregiver also mentions agency with [employer] is working on getting a ramp for pt attached to their home and another agency working on obtain a wheelchair for this pt. No other needs or inquires at this time. Verified pt's spouse has contact number with any changes or inquires related.   Will follow up next month with ongoing management of care. Goals Addressed            This Visit's Progress   . COMPLETED: THN F/u 9/2       CARE PLAN ENTRY (see longtitudinal plan of care for additional care plan information)  Objective:  Lab Results  Component Value Date   HGBA1C 5.9 (H) 12/16/2019 .   Lab Results  Component Value Date   CREATININE 2.73 (H) 01/06/2020   CREATININE 3.96 (H) 01/03/2020   CREATININE 3.13 (H) 01/02/2020 .   [name] No  results found for: EGFR  Current Barriers:  [name] Knowledge Deficits related to basic Diabetes pathophysiology and self care/management . Knowledge Deficits related to medications used for management of diabetes  Case Manager Clinical Goal(s):  Over the next 90 days, patient will demonstrate improved adherence to prescribed treatment plan for diabetes self care/management as evidenced by:  [name] Verbalize daily monitoring and recording of CBG within 30 days . Verbalize adherence to ADA/ carb modified diet within the next 30 days  Interventions:  . Provided education to patient about basic DM disease process . Reviewed medications with patient and discussed importance of medication adherence . Discussed plans with patient for ongoing care management follow up and provided patient with direct contact information for care management team . Provided patient with written educational materials related to hypo and hyperglycemia and importance of correct treatment . Reviewed scheduled/upcoming provider appointments including: all medications reviewed.  Patient Self Care Activities:  . Self administers oral medications as prescribed . Self administers insulin as prescribed . Attends all scheduled provider appointments . Checks blood sugars as prescribed and utilize hyper and hypoglycemia protocol as needed . Adheres to prescribed ADA/carb modified  Initial goal documentation  RESOLVING DUE TO DUPLICATE GOALS.    [name] THN-Make and Keep All Appointments       Follow Up Date  04/30/2020   - call to cancel if needed - keep a calendar with prescription refill dates - keep a calendar with appointment dates  Why is this important?   Part of staying healthy is seeing the doctor for follow-up care.  If you forget your appointments, there are some things you can do to stay on track.    Notes:     . THN-Manage Constipation       Follow Up Date 04/30/2020    - be active every day - call the doctor or  nurse if constipation doesn't get better - eat whole fruit instead of juice - increase the amount of dietary fiber in food - take a stool softener    Why is this important?   Eating foods that are high in iron can lead to hard, dry stool.  Taking iron pills or vitamins can also cause this to happen.  Making a few simple changes can help.    Notes:       Addendum: Received notification later this afternoon that pt is going to the ED at [facility] as requested by his provider for further work on his bleeding issues.    Raina Mina, RN Care Management Coordinator [place] Office [phone]

## 2020-03-26 NOTE — Telephone Encounter (Signed)
Spoke with patient's wife. States patient didn't want to go the the ED yesterday. She didn't ask for him to go today as I recommended. Reports he had another episode of rectal bleeding this morning passing blood only per rectum with no stool. Reports they went to [facility] at 9:30 am and had labs completed although we have not placed any lab orders. I do not see any labs pending in the system.   I reiterated by concern about going blood loss and the need for patient to be evaluated in the emergency room as I am not sure where his bleeding is coming from. Explained that depending on the etiology, it could be life threatening. Also explained that risk for SBP is increased which can also be a very serious illness. I asked to speak with patient directly, and wife stated she wasn't home but would talk with her husband again when she gets home. Recommended she take patient to the emergency room when she gets home.    I discussed with Elmo Putt, no lab orders were placed or given verbally to [facility] today. Elmo Putt called [facility] who reported drawing H/H under Dr. Roseanne Kaufman name this morning that will not result until tomorrow. They are only able to add on CBC to this.

## 2020-03-26 NOTE — ED Triage Notes (Signed)
Patient is having bloody stools x 1 week with abdominal pain, M-W_F, dialysis, told to come to ED by Dr. Lillia Carmel office.

## 2020-03-27 ENCOUNTER — Other Ambulatory Visit: Payer: Self-pay

## 2020-03-27 ENCOUNTER — Other Ambulatory Visit (HOSPITAL_COMMUNITY): Payer: Self-pay | Admitting: *Deleted

## 2020-03-27 ENCOUNTER — Other Ambulatory Visit: Payer: Self-pay | Admitting: Gastroenterology

## 2020-03-27 ENCOUNTER — Encounter (HOSPITAL_COMMUNITY): Payer: Self-pay | Admitting: Oncology

## 2020-03-27 ENCOUNTER — Telehealth: Payer: Self-pay | Admitting: Internal Medicine

## 2020-03-27 DIAGNOSIS — Z992 Dependence on renal dialysis: Secondary | ICD-10-CM | POA: Diagnosis not present

## 2020-03-27 DIAGNOSIS — I509 Heart failure, unspecified: Secondary | ICD-10-CM | POA: Diagnosis not present

## 2020-03-27 DIAGNOSIS — D61818 Other pancytopenia: Secondary | ICD-10-CM

## 2020-03-27 DIAGNOSIS — K625 Hemorrhage of anus and rectum: Secondary | ICD-10-CM

## 2020-03-27 DIAGNOSIS — D500 Iron deficiency anemia secondary to blood loss (chronic): Secondary | ICD-10-CM

## 2020-03-27 DIAGNOSIS — N186 End stage renal disease: Secondary | ICD-10-CM | POA: Diagnosis not present

## 2020-03-27 DIAGNOSIS — R0602 Shortness of breath: Secondary | ICD-10-CM | POA: Diagnosis not present

## 2020-03-27 LAB — TEST AUTHORIZATION

## 2020-03-27 LAB — CBC WITH DIFFERENTIAL/PLATELET
Absolute Monocytes: 289 cells/uL (ref 200–950)
Basophils Absolute: 42 cells/uL (ref 0–200)
Basophils Relative: 1.1 %
Eosinophils Absolute: 190 cells/uL (ref 15–500)
Eosinophils Relative: 5 %
HCT: 25.9 % — ABNORMAL LOW (ref 38.5–50.0)
Hemoglobin: 8.2 g/dL — ABNORMAL LOW (ref 13.2–17.1)
Lymphs Abs: 319 cells/uL — ABNORMAL LOW (ref 850–3900)
MCH: 29.8 pg (ref 27.0–33.0)
MCHC: 30.4 g/dL — ABNORMAL LOW (ref 32.0–36.0)
MCV: 97.9 fL (ref 80.0–100.0)
MPV: 10.3 fL (ref 7.5–12.5)
Monocytes Relative: 7.6 %
Neutro Abs: 2960 cells/uL (ref 1500–7800)
Neutrophils Relative %: 77.9 %
Platelets: 167 10*3/uL (ref 140–400)
RBC: 2.82 10*6/uL — ABNORMAL LOW (ref 4.20–5.80)
RDW: 15.7 % — ABNORMAL HIGH (ref 11.0–15.0)
Total Lymphocyte: 8.4 %
WBC: 3.8 10*3/uL (ref 3.8–10.8)

## 2020-03-27 MED ORDER — HYDROCORTISONE (PERIANAL) 2.5 % EX CREA: 1.0000 "application " | TOPICAL_CREAM | Freq: Two times a day (BID) | CUTANEOUS | 1 refills | Status: DC

## 2020-03-27 NOTE — Telephone Encounter (Signed)
Noted. Spoke to pts spouse. She is aware that cream was sent into pts pharmacy. Pt will complete labs next Thursday. Lab order placed and mailed to pt.

## 2020-03-27 NOTE — Telephone Encounter (Signed)
Spoke with spouse. She would like to know if labs were reviewed and what the next steps are. Pt is suppose to have more labs tomorrow and see the hematologist. Currently pt isn't bleeding today.

## 2020-03-27 NOTE — Telephone Encounter (Signed)
Noted. Rx for Anusol rectal cream sent to pharmacy. Since his OV isn't until 11/9, lets plan to recheck a CBC next Thursday to ensure stability. Please arrange.

## 2020-03-27 NOTE — Telephone Encounter (Signed)
See other phone note for documentation.  

## 2020-03-27 NOTE — Telephone Encounter (Signed)
I am glad his bleeding has resolved today. I did review his labs and ED visit yesterday. Hemoglobin is stable suggesting he is having very minimal blood loss. With this, I suspect his bleeding could be coming from hemorrhoids.   I would like to get him in for an office visit with the next available APP or with Dr. Gala Romney for further evaluation. We will need to set him up to have a colonoscopy but he needs an office visit.   I would also like to send in anusol rectal cream to his pharmacy for him to apply twice daily for the next 10 days for empiric treatment of hemorrhoids.   Jeffrey [name], please let me know what day patient gets scheduled for.

## 2020-03-27 NOTE — Telephone Encounter (Signed)
Please call patient's wife. [phone]

## 2020-03-27 NOTE — Telephone Encounter (Signed)
Spoke with pts spouse. She is aware of pts results. Pt has an apt previously scheduled for 04/09/20 with Dr. Gala Romney and spouse would like to keep it. Pt would like to try hemorrhoid cream. Please send to pts pharmacy.

## 2020-03-27 NOTE — Telephone Encounter (Signed)
Pt's wife has called again. I told her Elmo Putt was not available and transferred call to VM. [phone]

## 2020-03-28 ENCOUNTER — Encounter (HOSPITAL_COMMUNITY): Payer: Self-pay | Admitting: Oncology

## 2020-03-28 ENCOUNTER — Inpatient Hospital Stay (HOSPITAL_COMMUNITY): Payer: Medicare Other

## 2020-03-28 ENCOUNTER — Other Ambulatory Visit: Payer: Self-pay

## 2020-03-28 ENCOUNTER — Telehealth: Payer: Self-pay | Admitting: Gastroenterology

## 2020-03-28 ENCOUNTER — Inpatient Hospital Stay (HOSPITAL_COMMUNITY): Payer: Medicare Other | Attending: Hematology | Admitting: Oncology

## 2020-03-28 VITALS — BP 119/65 | HR 94 | Temp 96.9°F | Resp 20

## 2020-03-28 VITALS — BP 132/49 | HR 90 | Temp 96.9°F | Resp 20 | Wt 239.2 lb

## 2020-03-28 DIAGNOSIS — D649 Anemia, unspecified: Secondary | ICD-10-CM | POA: Diagnosis not present

## 2020-03-28 DIAGNOSIS — D61818 Other pancytopenia: Secondary | ICD-10-CM

## 2020-03-28 DIAGNOSIS — D500 Iron deficiency anemia secondary to blood loss (chronic): Secondary | ICD-10-CM

## 2020-03-28 LAB — COMPREHENSIVE METABOLIC PANEL
ALT: 25 U/L (ref 0–44)
AST: 23 U/L (ref 15–41)
Albumin: 2.3 g/dL — ABNORMAL LOW (ref 3.5–5.0)
Alkaline Phosphatase: 177 U/L — ABNORMAL HIGH (ref 38–126)
Anion gap: 12 (ref 5–15)
BUN: 24 mg/dL — ABNORMAL HIGH (ref 8–23)
CO2: 27 mmol/L (ref 22–32)
Calcium: 8.6 mg/dL — ABNORMAL LOW (ref 8.9–10.3)
Chloride: 92 mmol/L — ABNORMAL LOW (ref 98–111)
Creatinine, Ser: 2.97 mg/dL — ABNORMAL HIGH (ref 0.61–1.24)
GFR, Estimated: 22 mL/min — ABNORMAL LOW (ref >60)
Glucose, Bld: 122 mg/dL — ABNORMAL HIGH (ref 70–99)
Potassium: 4.1 mmol/L (ref 3.5–5.1)
Sodium: 131 mmol/L — ABNORMAL LOW (ref 135–145)
Total Bilirubin: 1 mg/dL (ref 0.3–1.2)
Total Protein: 6.8 g/dL (ref 6.5–8.1)

## 2020-03-28 LAB — CBC WITH DIFFERENTIAL/PLATELET
Abs Immature Granulocytes: 0.05 K/uL (ref 0.00–0.07)
Basophils Absolute: 0 K/uL (ref 0.0–0.1)
Basophils Relative: 0 %
Eosinophils Absolute: 0.1 K/uL (ref 0.0–0.5)
Eosinophils Relative: 1 %
HCT: 27.7 % — ABNORMAL LOW (ref 39.0–52.0)
Hemoglobin: 8.2 g/dL — ABNORMAL LOW (ref 13.0–17.0)
Immature Granulocytes: 1 %
Lymphocytes Relative: 4 %
Lymphs Abs: 0.4 K/uL — ABNORMAL LOW (ref 0.7–4.0)
MCH: 29.6 pg (ref 26.0–34.0)
MCHC: 29.6 g/dL — ABNORMAL LOW (ref 30.0–36.0)
MCV: 100 fL (ref 80.0–100.0)
Monocytes Absolute: 0.6 K/uL (ref 0.1–1.0)
Monocytes Relative: 6 %
Neutro Abs: 9 K/uL — ABNORMAL HIGH (ref 1.7–7.7)
Neutrophils Relative %: 88 %
Platelets: 153 K/uL (ref 150–400)
RBC: 2.77 MIL/uL — ABNORMAL LOW (ref 4.22–5.81)
RDW: 17 % — ABNORMAL HIGH (ref 11.5–15.5)
WBC: 10.1 K/uL (ref 4.0–10.5)
nRBC: 0 % (ref 0.0–0.2)

## 2020-03-28 LAB — IRON AND TIBC
Iron: 26 ug/dL — ABNORMAL LOW (ref 45–182)
Saturation Ratios: 13 % — ABNORMAL LOW (ref 17.9–39.5)
TIBC: 205 ug/dL — ABNORMAL LOW (ref 250–450)
UIBC: 179 ug/dL

## 2020-03-28 LAB — FERRITIN: Ferritin: 755 ng/mL — ABNORMAL HIGH (ref 24–336)

## 2020-03-28 LAB — LACTATE DEHYDROGENASE: LDH: 112 U/L (ref 98–192)

## 2020-03-28 LAB — VITAMIN D 25 HYDROXY (VIT D DEFICIENCY, FRACTURES): Vit D, 25-Hydroxy: 55.3 ng/mL (ref 30–100)

## 2020-03-28 LAB — VITAMIN B12: Vitamin B-12: 3520 pg/mL — ABNORMAL HIGH (ref 180–914)

## 2020-03-28 MED ORDER — SODIUM CHLORIDE 0.9 % IV SOLN: Freq: Once | INTRAVENOUS | Status: AC

## 2020-03-28 MED ORDER — SODIUM CHLORIDE 0.9 % IV SOLN
510.0000 mg | Freq: Once | INTRAVENOUS | Status: AC
  Administered 2020-03-28: 510 mg via INTRAVENOUS
  Filled 2020-03-28: qty 510

## 2020-03-28 MED ORDER — SODIUM CHLORIDE 0.9 % IV SOLN: Freq: Once | INTRAVENOUS | Status: DC

## 2020-03-28 NOTE — Telephone Encounter (Signed)
[facility] Clinical Pool: I have been working with Jeffrey [name] the last few days on new onset rectal bleeding. See prior telephone note. He was seen in the ED on 10/26. We were originally planning for OV prior to scheduling colonoscopy; however, I discussed this with Dr. Gala Romney today.   Please let patient know that Dr. Gala Romney recommends we go ahead and get him scheduled for a colonoscopy next week with the first available (Dr. Gala Romney or Dr. Abbey Chatters) rather than waiting for his upcoming OV. He can keep his OV for which he was already scheduled as a follow-up.   Orders:  TCS with propofol next week with first available (Dr. Gala Romney or Dr. Abbey Chatters).  ASA III Dx: Rectal bleeding   Medication Adjustments:  - Start holding iron now. May resume after procedure.   - 1 day prior: 1/2 dose Tresiba (60 units),  - Day of: No diabetes medications morning of procedure.   Needs CBC and BMP with pre-op labs

## 2020-03-28 NOTE — Progress Notes (Signed)
Patient presents today for Feraheme infusion and follow up visit with JBurns NP. Vital signs stable. MAR reviewed. Patient denies any significant changes since his last visit. CBC drawn and sent to lab. Patient has pain today he rates a 7/10 in his lower back.   Feraheme given today per MD orders. Tolerated infusion without adverse affects. Vital signs stable. No complaints at this time. Discharged from clinic ambulatory in stable condition. Alert and oriented x 3. F/U with [facility] as scheduled.

## 2020-03-28 NOTE — Progress Notes (Signed)
Jeffrey [name], [place] [id number]   CLINIC:  Medical Oncology/Hematology  PCP:  Monico Blitz, MD [place] [address] [phone]   REASON FOR VISIT: Follow-up for normocytic anemia   CURRENT THERAPY: Epogen injections given at dialysis.  And intermittent iron infusions  INTERVAL HISTORY:  [name] 74 y.o. male returns for routine follow-up for normocytic anemia.  He still receives dialysis MWF.  He was evaluated in the emergency room on 03/26/2020 for BRBPR x2 weeks.  Lab work appeared stable and he did not require admission.  He was instructed to follow-up outpatient with GI.   [name], PA with GI has already been in touch to get scheduled a colonoscopy.  She also send in a prescription for Anusol for possible hemorrhoids.  Today, he presents with his wife for possible IV iron.  He continues to have rectal bleeding with clots for the past 1 week.  Reports bright red blood.  Wife states it initially had stopped but has resumed as of this morning.  He denies any pain with bowel movements.  Denies any external hemorrhoids.  Has picked up the cream for possible internal hemorrhoids for GI.  Has chronic fatigue and weakness secondary to renal failure and dialysis.  Uses a walker at home to get around.  Denies any falls.  Denies any nausea, vomiting, or diarrhea. Denies any new pains. Denies recent chest pain on exertion, shortness of breath on minimal exertion, pre-syncopal episodes, or palpitations. Denies any numbness or tingling in hands or feet. Denies any recent fevers, infections, or recent hospitalizations. Patient reports appetite a 100% and energy level at 25%.  REVIEW OF SYSTEMS:  Review of Systems  Constitutional: Positive for fever. Negative for appetite change, chills and fatigue.  HENT:  Negative.  Negative for hearing loss, lump/mass, mouth sores and nosebleeds.   Eyes: Negative.  Negative for eye problems.  Respiratory:  Positive for shortness of breath. Negative for cough and hemoptysis.   Cardiovascular: Negative.  Negative for chest pain and leg swelling.  Gastrointestinal: Positive for abdominal pain, blood in stool and nausea. Negative for constipation, diarrhea and vomiting.  Endocrine: Negative.  Negative for hot flashes.  Genitourinary: Negative.  Negative for bladder incontinence, difficulty urinating, dysuria, frequency and hematuria.   Musculoskeletal: Positive for arthralgias. Negative for back pain, flank pain, gait problem and myalgias.  Skin: Negative.  Negative for itching and rash.  Neurological: Negative.  Negative for dizziness, gait problem, headaches, light-headedness and numbness.  Hematological: Negative.  Negative for adenopathy.  Psychiatric/Behavioral: Negative for confusion. The patient is not nervous/anxious.      PAST MEDICAL/SURGICAL HISTORY:  Past Medical History:  Diagnosis Date  . A-fib [facility])    when initially stating dialysis january 2021 at [facility] per spouse; never heard anything else about it  . Alcoholic cirrhosis ([place])    patient reports completing hep a and b vaccines in 2006  . Anemia    has had 3 units of prbcs 2013  . Anxiety   . Arthritis   . Asymptomatic gallstones    Ultrasound in 2006  . C. difficile colitis   . Cholelithiasis   . Chronic kidney disease    Dialysis M/W/F/Sa  . Depression   . Diabetes ([place])   . Duodenal ulcer with hemorrhage    per patient in 2006 or 2007, records have been requested  . Dyspnea    low oxygen level -  has O2 2 L  all day  . 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 GERD (gastroesophageal reflux disease)   . Heart burn   . 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 HOH (hard of hearing)   . Hypertension   . 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 Splenomegaly    Ultrasound in 2006   Past Surgical History:  Procedure Laterality Date  . AGILE CAPSULE N/A 09/20/2019   Procedure: AGILE CAPSULE;  Surgeon: Daneil Dolin, MD;  [facility];  Service: Endoscopy;  Laterality: N/A;  . APPENDECTOMY    . AV FISTULA PLACEMENT Left 08/17/2019   Procedure: ARTERIOVENOUS (AV) FISTULA CREATION VERSES ARTERIOVENOUS GRAFT;  Surgeon: [name], MD;  Location: [place];  Service: Vascular;  Laterality: Left;  . BASCILIC VEIN TRANSPOSITION Left 09/28/2019   Procedure: LEFT SECOND STAGE BASCILIC VEIN TRANSPOSITION;  Surgeon: [name], MD;  Location: [place];  Service: Vascular;  Laterality: Left;  . BIOPSY  02/03/2018   Procedure: BIOPSY;  Surgeon: Daneil Dolin, MD;  [facility];  Service: Endoscopy;;  bx of gastric polyps  . BIOPSY  09/19/2019   Procedure: BIOPSY;  Surgeon: Danie Binder, MD;  [facility];  Service: Endoscopy;;  . CATARACT EXTRACTION Right   . COLONOSCOPY  03/10/2005   Rectal polyp as described above, removed with snare. Left sided  diverticula. The remainder of the colonic mucosa appeared normal. Inflammed polyp on path.  . COLONOSCOPY  04/1999   Dr. Thea Silversmith polyps removed,  Path showed hyperplastic  . COLONOSCOPY  10/2015   Dr. Britta Mccreedy: diverticulosis, single sessile tubular adenoma 3-6mm in size removed from descending colon.   . COLONOSCOPY WITH ESOPHAGOGASTRODUODENOSCOPY (EGD)  03/31/2012   OIZ:[id number] AVMs. Colonic diverticulosis. tubular adenoma colon  . COLONOSCOPY WITH ESOPHAGOGASTRODUODENOSCOPY (EGD)  06/2019   FORSYTH: small esophageal varices without high risk stigmata, single large AVM on lesser curvature in gastric body s/p APC ablation, gastric antral and duodenal bulb polyposis. No significant source to explain transfusion dependent anemia. Colonoscopy with portal colopathy and diffuse edema, changes of Cdiff colitis on colonoscopy  . ESOPHAGEAL BANDING N/A 02/03/2018   Procedure: ESOPHAGEAL BANDING;  Surgeon: Daneil Dolin, MD;  [facility];  Service: Endoscopy;  Laterality: N/A;  . ESOPHAGOGASTRODUODENOSCOPY  01/15/2005   Three  columns grade 1 to 2 esophageal varices, otherwise normal esophageal mucosa.  Esophagus was not manipulated otherwise./Nodularity of the antrum with overlying erosions, nonspecific finding. Path showed rare H.pylori  . ESOPHAGOGASTRODUODENOSCOPY  09/2008   Dr. Gaylene Brooks columns of grade 2-3 esoph varices, only one column was prominent. Portal gastropathy, multiple gastrc polyps at antrum, two were 2cm with black eschar, bulbar polyps, bulbar erosions  . ESOPHAGOGASTRODUODENOSCOPY  11/2004   Dr. Brantley Stage 3 esoph varices  . ESOPHAGOGASTRODUODENOSCOPY  03/31/2012   RMR: 4 columns(3-GR 2, 1-GR1) non-bleeding esophageal varices, portal gastropathy, small HH, early GAVE, multiple gastric polyps   . ESOPHAGOGASTRODUODENOSCOPY (EGD) WITH PROPOFOL N/A 02/03/2018   Dr. Gala Romney: Esophageal varices, 3 columns grade 1-2.  Portal hypertensive gastropathy.  Multiple gastric polyps, biopsy consistent with hyperplastic.  [name] ESOPHAGOGASTRODUODENOSCOPY (EGD) WITH PROPOFOL N/A 09/19/2019   Fields: grade I and II esophageal varcies, mild portal hypertensive gastropathy, moderate gastritis but no H. pylori, single hyperplastic gastric polyp removed, obvious source for melena/transfusion dependent anemia not identified, may be due to friable gastric and duodenal mucosa in the setting of portal hypertension  . GASTRIC VARICES BANDING  03/31/2012  Procedure: GASTRIC VARICES BANDING;  Surgeon: Daneil Dolin, MD;  [facility];  Service: Endoscopy;  Laterality: N/A;  . GIVENS CAPSULE STUDY N/A 09/21/2019   Poor study with a lot of debris obstructing much of the view of the first approximate 3 hours out of 6 hours.  No obvious source of bleeding identified.  Sequela of bleeding and old blood seen in the form of"whisps" of blood, blood flecks mostly toward the end of the study.  . IR REMOVAL TUN CV CATH W/O FL  02/15/2020  . POLYPECTOMY  09/19/2019   Procedure: POLYPECTOMY;  Surgeon: Danie Binder, MD;   [facility];  Service: Endoscopy;;  . UMBILICAL HERNIA REPAIR  2017   exploratory laparotomy, abdominal washout     SOCIAL HISTORY:  Social History   Socioeconomic History  . Marital status: Married    Spouse name: Not on file  . Number of children: 2  . Years of education: Not on file  . Highest education level: Not on file  Occupational History  . Occupation: RETIRED    Employer: SELF EMPLOYED  Tobacco Use  . Smoking status: Former Smoker    Packs/day: 2.00    Years: 25.00    Pack years: 50.00    Types: Cigarettes    Quit date: 01/28/1986    Years since quitting: 34.1  . Smokeless tobacco: Never Used  Vaping Use  . Vaping Use: Never used  Substance and Sexual Activity  . Alcohol use: No    Comment: quit in 05/2013  . Drug use: No  . Sexual activity: Not Currently  Other Topics Concern  . Not on file  Social History Narrative   Two step children from second marriage (divorced) who live with him along with some grandchildren.    Social Determinants of Health   Financial Resource Strain:   . Difficulty of Paying Living Expenses: Not on file  Food Insecurity: No Food Insecurity  . Worried About Charity fundraiser in the Last Year: Never true  . Ran Out of Food in the Last Year: Never true  Transportation Needs: No Transportation Needs  . Lack of Transportation (Medical): No  . Lack of Transportation (Non-Medical): No  Physical Activity:   . Days of Exercise per Week: Not on file  . Minutes of Exercise per Session: Not on file  Stress:   . Feeling of Stress : Not on file  Social Connections:   . Frequency of Communication with Friends and Family: Not on file  . Frequency of Social Gatherings with Friends and Family: Not on file  . Attends Religious Services: Not on file  . Active Member of Clubs or Organizations: Not on file  . Attends Archivist Meetings: Not on file  . Marital Status: Not on file  Intimate Partner Violence:   . Fear  of Current or Ex-Partner: Not on file  . Emotionally Abused: Not on file  . Physically Abused: Not on file  . Sexually Abused: Not on file    FAMILY HISTORY:  Family History  Problem Relation Age of Onset  . Colon cancer Father 53       deceased age 58  . Breast cancer Sister        deceased  . Diabetes Sister   . Stroke Mother   . Healthy Son   . Healthy Daughter   . Lung cancer Neg Hx   . Ovarian cancer Neg Hx     CURRENT  MEDICATIONS:  Outpatient Encounter Medications as of 03/28/2020  Medication Sig Note  . Alcohol Swabs (ALCOHOL PREP) 70 % PADS Apply 1 each topically 3 (three) times daily.   . Ascorbic Acid (VITAMIN C) 1000 MG tablet Take 1,000 mg by mouth 2 (two) times daily.   [name] diltiazem (CARDIZEM CD) 120 MG 24 hr capsule Take 1 capsule (120 mg total) by mouth daily.   . furosemide (LASIX) 40 MG tablet Take 40 mg by mouth 2 (two) times daily.   [name] gabapentin (NEURONTIN) 100 MG capsule TAKE 1 CAPSULE BY MOUTH AT BEDTIME (Patient taking differently: Take 100 mg by mouth at bedtime. )   . hydrocortisone (ANUSOL-HC) 2.5 % rectal cream Place 1 application rectally 2 (two) times daily.   . hydrOXYzine (VISTARIL) 25 MG capsule Take 25 mg by mouth 3 (three) times daily as needed for itching.    . insulin degludec (TRESIBA FLEXTOUCH) 200 UNIT/ML FlexTouch Pen Inject 120 Units into the skin at bedtime. When blood sugars are up   . iron polysaccharides (NIFEREX) 150 MG capsule Take 150 mg by mouth at bedtime.    [name] lactulose (CHRONULAC) 10 GM/15ML solution Take 45 mLs (30 g total) by mouth 3 (three) times daily.   . metoprolol succinate (TOPROL-XL) 25 MG 24 hr tablet Take 1 tablet (25 mg total) by mouth daily. Take at bedtime  on Monday ,Wednesday and Friday due to dialaylisis (Patient taking differently: Take 25 mg by mouth See admin instructions. Take 1 tablet at bedtime every night and then add 1 tablet in morning on  Sun, Tues, Thurs and Sat) 02/19/2020: Last dose Friday (02/16/20)  Unsure of time  . milk thistle 175 MG tablet Take 175 mg by mouth 3 (three) times daily.    . mirtazapine (REMERON) 30 MG tablet Take 1 tablet (30 mg total) by mouth at bedtime.   . Multiple Vitamin (MULTIVITAMIN WITH MINERALS) TABS tablet Take 1 tablet by mouth in the morning.    [name] NOVOFINE 32G X 6 MM MISC Inject into the skin.   [name] nystatin (MYCOSTATIN/NYSTOP) powder Apply topically 3 (three) times daily. Into groin area and affected area   . omega-3 acid ethyl esters (LOVAZA) 1 g capsule Take 2 g by mouth 2 (two) times daily.    [name] omeprazole (PRILOSEC) 40 MG capsule Take 40 mg by mouth in the morning.    [name] oxyCODONE-acetaminophen (PERCOCET/ROXICET) 5-325 MG tablet Take 1 tablet by mouth 2 (two) times daily.   . pravastatin (PRAVACHOL) 20 MG tablet Take 20 mg by mouth in the morning.    . Probiotic Product (PROBIOTIC DAILY PO) Take 1 tablet by mouth in the morning.    [name] spironolactone (ALDACTONE) 50 MG tablet Take 50 mg by mouth in the morning.    . tamsulosin (FLOMAX) 0.4 MG CAPS capsule Take 0.4 mg by mouth every evening.   . thiamine 100 MG tablet Take 100 mg by mouth in the morning. Vitamin B-1   . thiamine 100 MG tablet Take 100 mg by mouth daily.   [name] tiZANidine (ZANAFLEX) 2 MG tablet Take 2 mg by mouth 2 (two) times daily as needed.   . triamcinolone cream (KENALOG) 0.1 % Apply 1 application topically 2 (two) times daily as needed (dry skin Itching).    . venlafaxine (EFFEXOR) 75 MG tablet Take 75 mg by mouth 3 (three) times daily with meals.    . vitamin B-12 (CYANOCOBALAMIN) 1000 MCG tablet Take 1,000 mcg by mouth daily.   [name]  XIFAXAN 550 MG TABS tablet TAKE 1 TABLET BY MOUTH TWICE DAILY   . acetaminophen (TYLENOL) 500 MG tablet Take 500 mg by mouth every 6 (six) hours as needed for mild pain. (Patient not taking: Reported on 03/28/2020)   . HYDROcodone-acetaminophen (NORCO/VICODIN) 5-325 MG tablet Take 1 tablet by mouth 2 (two) times daily as needed for moderate pain or severe pain.   (Patient not taking: Reported on 03/28/2020)   . ondansetron (ZOFRAN) 4 MG tablet Take 4 mg by mouth See admin instructions. Take 4 mg by mouth every 4 hours as needed (Patient not taking: Reported on 03/28/2020) 03/26/2020: Has not started med yet.   No facility-administered encounter medications on file as of 03/28/2020.    ALLERGIES:  Allergies  Allergen Reactions  . Chlorhexidine   . Tape Itching and Rash     PHYSICAL EXAM:  ECOG Performance status: 1  Vitals:   03/28/20 1313  BP: (!) 132/49  Pulse: 90  Resp: 20  Temp: (!) 96.9 F (36.1 C)  SpO2: 96%   Filed Weights   03/28/20 1307 03/28/20 1313  Weight: 239 lb 3.2 oz (108.5 kg) 239 lb 3.2 oz (108.5 kg)   Physical Exam Constitutional:      Appearance: Normal appearance. He is normal weight.     Comments: Obese-sitting in wheelchair.  Cardiovascular:     Rate and Rhythm: Normal rate and regular rhythm.     Heart sounds: Normal heart sounds.  Pulmonary:     Effort: Pulmonary effort is normal.     Breath sounds: Normal breath sounds.  Abdominal:     General: Bowel sounds are normal.     Palpations: Abdomen is soft.  Musculoskeletal:        General: Normal range of motion.  Skin:    General: Skin is warm.     Coloration: Skin is jaundiced and pale.  Neurological:     Mental Status: He is alert and oriented to person, place, and time. Mental status is at baseline.  Psychiatric:        Mood and Affect: Mood normal.        Behavior: Behavior normal.        Thought Content: Thought content normal.        Judgment: Judgment normal.      LABORATORY DATA:  I have reviewed the labs as listed.  CBC    Component Value Date/Time   WBC 3.7 (L) 03/26/2020 1709   RBC 2.90 (L) 03/26/2020 1709   HGB 8.8 (L) 03/26/2020 1709   HCT 28.9 (L) 03/26/2020 1709   HCT 28 02/25/2012 1034   PLT 161 03/26/2020 1709   PLT 125 02/25/2012 1034   MCV 99.7 03/26/2020 1709   MCV 82.0 02/25/2012 1034   MCH 30.3 03/26/2020 1709    MCHC 30.4 03/26/2020 1709   RDW 17.2 (H) 03/26/2020 1709   LYMPHSABS 319 (L) 03/26/2020 0933   MONOABS 0.3 02/20/2020 0636   EOSABS 190 03/26/2020 0933   BASOSABS 42 03/26/2020 0933   CMP Latest Ref Rng & Units 03/28/2020 03/26/2020 02/20/2020  Glucose 70 - 99 mg/dL 122(H) 213(H) 144(H)  BUN 8 - 23 mg/dL 24(H) 24(H) 48(H)  Creatinine 0.61 - 1.24 mg/dL 2.97(H) 2.82(H) 4.31(H)  Sodium 135 - 145 mmol/L 131(L) 128(L) 122(L)  Potassium 3.5 - 5.1 mmol/L 4.1 3.6 4.9  Chloride 98 - 111 mmol/L 92(L) 93(L) 88(L)  CO2 22 - 32 mmol/L 27 24 22   Calcium 8.9 - 10.3 mg/dL 8.6(L) 8.4(L) 8.7(L)  Total Protein 6.5 - 8.1 g/dL 6.8 7.3 7.2  Total Bilirubin 0.3 - 1.2 mg/dL 1.0 0.7 1.3(H)  Alkaline Phos 38 - 126 U/L 177(H) 229(H) 232(H)  AST 15 - 41 U/L 23 31 45(H)  ALT 0 - 44 U/L 25 31 24     All questions were answered to patient's stated satisfaction. Encouraged patient to call with any new concerns or questions before his next visit to the cancer center and we can certain see him sooner, if needed.     ASSESSMENT & PLAN:  1. 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 -CTAP on 01/10/2016 showed splenomegaly. -Work-up included SPEP and immunofixation is negative. -Last iron infusions were on 02/13/20, 02/20/20 -His last blood transfusion was on 11/20/2019 -Labs done on 03/28/20 showed hemoglobin 8.2, platelets 153, WBC 10.1, ferritin pending percent saturation 17, creatinine 2.97 -Proceed with 1 infusions of IV iron until iron levels resolved. -Dialysis is giving him Epogen injections weekly. -He will return to the clinic in 8 weeks with repeat labs  2. Vitamin B12 deficiency: -Methylmalonic acid was elevated at 672. B12 was normal at 664. -He was given a B12 injection and placed on oral B12 1 mg tablets daily.  3.  End-stage renal disease: -Creatinine is 2.97 -He gets dialysis Monday Wednesday Friday -He receives Epogen injections at his dialysis.  4.  Active GI  bleed: -Bright red blood per wife with clots from rectum. -Positive stool occult on 03/26/2020. -Followed by GI. -Plan is to perform colonoscopy in the near future. -Given he is actively bleeding we will go ahead and proceed with 1 dose of IV iron until labs result.   Disposition: -IV Feraheme today. -We will decide on additional IV iron once ferritin and iron studies come back. -Return to clinic in 8 weeks for lab work and assessment.  Greater than 50% was spent in counseling and coordination of care with this patient including but not limited to discussion of the relevant topics above (See A&P) including, but not limited to diagnosis and management of acute and chronic medical conditions.   No problem-specific Assessment & Plan notes found for this encounter.  Orders placed this encounter:  No orders of the defined types were placed in this encounter.  Faythe Casa, NP 03/28/2020 3:56 PM  [place] [phone]

## 2020-03-28 NOTE — Telephone Encounter (Addendum)
Called pt and spoke w/ spouse. Pt scheduled with Dr. Abbey Chatters on 11/2 at 3:00pm. Aware will leave instructions with pr-eop/covid test for p/u at front desk. Orders entered.

## 2020-03-29 DIAGNOSIS — N186 End stage renal disease: Secondary | ICD-10-CM | POA: Diagnosis not present

## 2020-03-29 DIAGNOSIS — Z992 Dependence on renal dialysis: Secondary | ICD-10-CM | POA: Diagnosis not present

## 2020-03-29 DIAGNOSIS — E119 Type 2 diabetes mellitus without complications: Secondary | ICD-10-CM | POA: Diagnosis not present

## 2020-03-29 DIAGNOSIS — I5022 Chronic systolic (congestive) heart failure: Secondary | ICD-10-CM | POA: Diagnosis not present

## 2020-03-29 DIAGNOSIS — I10 Essential (primary) hypertension: Secondary | ICD-10-CM | POA: Diagnosis not present

## 2020-03-29 NOTE — Patient Instructions (Signed)
[facility] Discharge Instructions for Patients Receiving Chemotherapy  Today you received the following chemotherapy agents   To help prevent nausea and vomiting after your treatment, we encourage you to take your nausea medication   If you develop nausea and vomiting that is not controlled by your nausea medication, call the clinic.   BELOW ARE SYMPTOMS THAT SHOULD BE REPORTED IMMEDIATELY:  *FEVER GREATER THAN 100.5 F  *CHILLS WITH OR WITHOUT FEVER  NAUSEA AND VOMITING THAT IS NOT CONTROLLED WITH YOUR NAUSEA MEDICATION  *UNUSUAL SHORTNESS OF BREATH  *UNUSUAL BRUISING OR BLEEDING  TENDERNESS IN MOUTH AND THROAT WITH OR WITHOUT PRESENCE OF ULCERS  *URINARY PROBLEMS  *BOWEL PROBLEMS  UNUSUAL RASH Items with * indicate a potential emergency and should be followed up as soon as possible.  Feel free to call the clinic should you have any questions or concerns. The clinic phone number is [phone].  Please show the CHEMO ALERT CARD at check-in to the Emergency Department and triage nurse.   

## 2020-03-29 NOTE — Telephone Encounter (Signed)
Endo scheduler called office, pt will have pre-op phone call 04/01/20 d/t dialysis pt. COVID test 04/01/20 at 12:45pm.  Called and informed pt's wife of covid test appt. He will start drinking prep before covid test appt. Gave her phone# for endo scheduler to change appt.

## 2020-03-29 NOTE — Patient Instructions (Signed)
Jeffrey [name]  03/29/2020     @PREFPERIOPPHARMACY @   Your procedure is scheduled on  04/02/2020.  Report to [facility] at  1230  P.M.  Call this number if you have problems the morning of surgery:  [phone]   Remember:  Follow the diet and prep instructions given to you by the office.                    Take these medicines the morning of surgery with A SIP OF WATER    Diltiazem, Hydrocodone or oxycodone(if needed), vistaril(if needed), metoprolol, prilosec, zofran(if needed), zanaflex(if needed), effexor. Take 60 units of tresiba the night before your procedure. DO NOT take any medications for diabetes the morning of your procedure.    Do not wear jewelry, make-up or nail polish.  Do not wear lotions, powders, or perfumes. PLease wear deodorant and brush your teeth.  Do not shave 48 hours prior to surgery.  Men may shave face and neck.  Do not bring valuables to the hospital.  [facility] is not responsible for any belongings or valuables.  Contacts, dentures or bridgework may not be worn into surgery.  Leave your suitcase in the car.  After surgery it may be brought to your room.  For patients admitted to the hospital, discharge time will be determined by your treatment team.  Patients discharged the day of surgery will not be allowed to drive home.   Name and phone number of your driver:   family Special instructions:  DO NOT smoke the morning of your procedure.  Please read over the following fact sheets that you were given. Anesthesia Post-op Instructions and Care and Recovery After Surgery       Colonoscopy, Adult, Care After This sheet gives you information about how to care for yourself after your procedure. Your health care provider may also give you more specific instructions. If you have problems or questions, contact your health care provider. What can I expect after the procedure? After the procedure, it is common to have:  A small amount of blood  in your stool for 24 hours after the procedure.  Some gas.  Mild cramping or bloating of your abdomen. Follow these instructions at home: Eating and drinking   Drink enough fluid to keep your urine pale yellow.  Follow instructions from your health care provider about eating or drinking restrictions.  Resume your normal diet as instructed by your health care provider. Avoid heavy or fried foods that are hard to digest. Activity  Rest as told by your health care provider.  Avoid sitting for a long time without moving. Get up to take short walks every 1-2 hours. This is important to improve blood flow and breathing. Ask for help if you feel weak or unsteady.  Return to your normal activities as told by your health care provider. Ask your health care provider what activities are safe for you. Managing cramping and bloating   Try walking around when you have cramps or feel bloated.  Apply heat to your abdomen as told by your health care provider. Use the heat source that your health care provider recommends, such as a moist heat pack or a heating pad. ? Place a towel between your skin and the heat source. ? Leave the heat on for 20-30 minutes. ? Remove the heat if your skin turns bright red. This is especially important if you are unable to feel pain, heat,  or cold. You may have a greater risk of getting burned. General instructions  For the first 24 hours after the procedure: ? Do not drive or use machinery. ? Do not sign important documents. ? Do not drink alcohol. ? Do your regular daily activities at a slower pace than normal. ? Eat soft foods that are easy to digest.  Take over-the-counter and prescription medicines only as told by your health care provider.  Keep all follow-up visits as told by your health care provider. This is important. Contact a health care provider if:  You have blood in your stool 2-3 days after the procedure. Get help right away if you  have:  More than a small spotting of blood in your stool.  Large blood clots in your stool.  Swelling of your abdomen.  Nausea or vomiting.  A fever.  Increasing pain in your abdomen that is not relieved with medicine. Summary  After the procedure, it is common to have a small amount of blood in your stool. You may also have mild cramping and bloating of your abdomen.  For the first 24 hours after the procedure, do not drive or use machinery, sign important documents, or drink alcohol.  Get help right away if you have a lot of blood in your stool, nausea or vomiting, a fever, or increased pain in your abdomen. This information is not intended to replace advice given to you by your health care provider. Make sure you discuss any questions you have with your health care provider. Document Revised: 12/12/2018 Document Reviewed: 12/12/2018 Elsevier Patient Education  [place] After These instructions provide you with information about caring for yourself after your procedure. Your health care provider may also give you more specific instructions. Your treatment has been planned according to current medical practices, but problems sometimes occur. Call your health care provider if you have any problems or questions after your procedure. What can I expect after the procedure? After your procedure, you may:  Feel sleepy for several hours.  Feel clumsy and have poor balance for several hours.  Feel forgetful about what happened after the procedure.  Have poor judgment for several hours.  Feel nauseous or vomit.  Have a sore throat if you had a breathing tube during the procedure. Follow these instructions at home: For at least 24 hours after the procedure:      Have a responsible adult stay with you. It is important to have someone help care for you until you are awake and alert.  Rest as needed.  Do not: ? Participate in activities  in which you could fall or become injured. ? Drive. ? Use heavy machinery. ? Drink alcohol. ? Take sleeping pills or medicines that cause drowsiness. ? Make important decisions or sign legal documents. ? Take care of children on your own. Eating and drinking  Follow the diet that is recommended by your health care provider.  If you vomit, drink water, juice, or soup when you can drink without vomiting.  Make sure you have little or no nausea before eating solid foods. General instructions  Take over-the-counter and prescription medicines only as told by your health care provider.  If you have sleep apnea, surgery and certain medicines can increase your risk for breathing problems. Follow instructions from your health care provider about wearing your sleep device: ? Anytime you are sleeping, including during daytime naps. ? While taking prescription pain medicines, sleeping medicines, or medicines that  make you drowsy.  If you smoke, do not smoke without supervision.  Keep all follow-up visits as told by your health care provider. This is important. Contact a health care provider if:  You keep feeling nauseous or you keep vomiting.  You feel light-headed.  You develop a rash.  You have a fever. Get help right away if:  You have trouble breathing. Summary  For several hours after your procedure, you may feel sleepy and have poor judgment.  Have a responsible adult stay with you for at least 24 hours or until you are awake and alert. This information is not intended to replace advice given to you by your health care provider. Make sure you discuss any questions you have with your health care provider. Document Revised: 08/16/2017 Document Reviewed: 09/08/2015 Elsevier Patient Education  [place].

## 2020-03-31 DIAGNOSIS — Z992 Dependence on renal dialysis: Secondary | ICD-10-CM | POA: Diagnosis not present

## 2020-03-31 DIAGNOSIS — N186 End stage renal disease: Secondary | ICD-10-CM | POA: Diagnosis not present

## 2020-04-01 ENCOUNTER — Other Ambulatory Visit: Payer: Self-pay

## 2020-04-01 ENCOUNTER — Encounter (HOSPITAL_COMMUNITY): Payer: Self-pay

## 2020-04-01 ENCOUNTER — Telehealth: Payer: Self-pay

## 2020-04-01 ENCOUNTER — Emergency Department (HOSPITAL_COMMUNITY): Payer: Medicare Other

## 2020-04-01 ENCOUNTER — Other Ambulatory Visit (HOSPITAL_COMMUNITY)
Admission: RE | Admit: 2020-04-01 | Discharge: 2020-04-01 | Disposition: A | Discharge status: Home / Self Care | Payer: Medicare Other | Source: Ambulatory Visit | Attending: Internal Medicine | Admitting: Internal Medicine

## 2020-04-01 ENCOUNTER — Other Ambulatory Visit (HOSPITAL_COMMUNITY): Payer: Medicare Other

## 2020-04-01 ENCOUNTER — Encounter (HOSPITAL_COMMUNITY)
Admission: RE | Admit: 2020-04-01 | Discharge: 2020-04-01 | Disposition: A | Discharge status: Home / Self Care | Payer: Medicare Other | Source: Ambulatory Visit | Attending: Internal Medicine | Admitting: Internal Medicine

## 2020-04-01 ENCOUNTER — Inpatient Hospital Stay (HOSPITAL_COMMUNITY)
Admission: EM | Admit: 2020-04-01 | Discharge: 2020-04-06 | DRG: 371 | Disposition: A | Discharge status: Home Health | Payer: Medicare Other | Attending: Internal Medicine | Admitting: Internal Medicine

## 2020-04-01 ENCOUNTER — Encounter (HOSPITAL_COMMUNITY): Payer: Self-pay | Admitting: Emergency Medicine

## 2020-04-01 DIAGNOSIS — K769 Liver disease, unspecified: Secondary | ICD-10-CM | POA: Diagnosis not present

## 2020-04-01 DIAGNOSIS — J181 Lobar pneumonia, unspecified organism: Secondary | ICD-10-CM

## 2020-04-01 DIAGNOSIS — E8770 Fluid overload, unspecified: Secondary | ICD-10-CM | POA: Diagnosis not present

## 2020-04-01 DIAGNOSIS — Z01812 Encounter for preprocedural laboratory examination: Secondary | ICD-10-CM | POA: Insufficient documentation

## 2020-04-01 DIAGNOSIS — L03115 Cellulitis of right lower limb: Secondary | ICD-10-CM | POA: Diagnosis present

## 2020-04-01 DIAGNOSIS — D631 Anemia in chronic kidney disease: Secondary | ICD-10-CM | POA: Diagnosis present

## 2020-04-01 DIAGNOSIS — I10 Essential (primary) hypertension: Secondary | ICD-10-CM | POA: Diagnosis not present

## 2020-04-01 DIAGNOSIS — Z91048 Other nonmedicinal substance allergy status: Secondary | ICD-10-CM

## 2020-04-01 DIAGNOSIS — J9621 Acute and chronic respiratory failure with hypoxia: Secondary | ICD-10-CM | POA: Diagnosis present

## 2020-04-01 DIAGNOSIS — K7290 Hepatic failure, unspecified without coma: Secondary | ICD-10-CM | POA: Diagnosis present

## 2020-04-01 DIAGNOSIS — Z79899 Other long term (current) drug therapy: Secondary | ICD-10-CM

## 2020-04-01 DIAGNOSIS — R531 Weakness: Secondary | ICD-10-CM | POA: Diagnosis not present

## 2020-04-01 DIAGNOSIS — E871 Hypo-osmolality and hyponatremia: Secondary | ICD-10-CM | POA: Diagnosis present

## 2020-04-01 DIAGNOSIS — I5033 Acute on chronic diastolic (congestive) heart failure: Secondary | ICD-10-CM | POA: Diagnosis present

## 2020-04-01 DIAGNOSIS — Z6837 Body mass index (BMI) 37.0-37.9, adult: Secondary | ICD-10-CM

## 2020-04-01 DIAGNOSIS — R7881 Bacteremia: Secondary | ICD-10-CM | POA: Diagnosis not present

## 2020-04-01 DIAGNOSIS — R0902 Hypoxemia: Secondary | ICD-10-CM | POA: Diagnosis not present

## 2020-04-01 DIAGNOSIS — E7220 Disorder of urea cycle metabolism, unspecified: Secondary | ICD-10-CM | POA: Diagnosis present

## 2020-04-01 DIAGNOSIS — I482 Chronic atrial fibrillation, unspecified: Secondary | ICD-10-CM | POA: Diagnosis not present

## 2020-04-01 DIAGNOSIS — Z01818 Encounter for other preprocedural examination: Secondary | ICD-10-CM | POA: Diagnosis not present

## 2020-04-01 DIAGNOSIS — K7682 Hepatic encephalopathy: Secondary | ICD-10-CM | POA: Diagnosis present

## 2020-04-01 DIAGNOSIS — B961 Klebsiella pneumoniae [K. pneumoniae] as the cause of diseases classified elsewhere: Secondary | ICD-10-CM | POA: Diagnosis present

## 2020-04-01 DIAGNOSIS — E1129 Type 2 diabetes mellitus with other diabetic kidney complication: Secondary | ICD-10-CM | POA: Diagnosis not present

## 2020-04-01 DIAGNOSIS — R188 Other ascites: Secondary | ICD-10-CM | POA: Diagnosis not present

## 2020-04-01 DIAGNOSIS — Z743 Need for continuous supervision: Secondary | ICD-10-CM | POA: Diagnosis not present

## 2020-04-01 DIAGNOSIS — E8779 Other fluid overload: Secondary | ICD-10-CM | POA: Diagnosis not present

## 2020-04-01 DIAGNOSIS — Z87891 Personal history of nicotine dependence: Secondary | ICD-10-CM

## 2020-04-01 DIAGNOSIS — F418 Other specified anxiety disorders: Secondary | ICD-10-CM | POA: Diagnosis present

## 2020-04-01 DIAGNOSIS — Z91018 Allergy to other foods: Secondary | ICD-10-CM

## 2020-04-01 DIAGNOSIS — E1122 Type 2 diabetes mellitus with diabetic chronic kidney disease: Secondary | ICD-10-CM | POA: Diagnosis not present

## 2020-04-01 DIAGNOSIS — G9341 Metabolic encephalopathy: Secondary | ICD-10-CM | POA: Diagnosis present

## 2020-04-01 DIAGNOSIS — Z20822 Contact with and (suspected) exposure to covid-19: Secondary | ICD-10-CM | POA: Insufficient documentation

## 2020-04-01 DIAGNOSIS — G8929 Other chronic pain: Secondary | ICD-10-CM | POA: Diagnosis present

## 2020-04-01 DIAGNOSIS — I120 Hypertensive chronic kidney disease with stage 5 chronic kidney disease or end stage renal disease: Secondary | ICD-10-CM | POA: Diagnosis not present

## 2020-04-01 DIAGNOSIS — E669 Obesity, unspecified: Secondary | ICD-10-CM | POA: Diagnosis present

## 2020-04-01 DIAGNOSIS — K219 Gastro-esophageal reflux disease without esophagitis: Secondary | ICD-10-CM | POA: Diagnosis present

## 2020-04-01 DIAGNOSIS — K7031 Alcoholic cirrhosis of liver with ascites: Secondary | ICD-10-CM | POA: Diagnosis present

## 2020-04-01 DIAGNOSIS — R161 Splenomegaly, not elsewhere classified: Secondary | ICD-10-CM | POA: Diagnosis present

## 2020-04-01 DIAGNOSIS — K7460 Unspecified cirrhosis of liver: Secondary | ICD-10-CM | POA: Diagnosis present

## 2020-04-01 DIAGNOSIS — Z992 Dependence on renal dialysis: Secondary | ICD-10-CM

## 2020-04-01 DIAGNOSIS — K652 Spontaneous bacterial peritonitis: Principal | ICD-10-CM | POA: Diagnosis present

## 2020-04-01 DIAGNOSIS — I953 Hypotension of hemodialysis: Secondary | ICD-10-CM | POA: Diagnosis not present

## 2020-04-01 DIAGNOSIS — K766 Portal hypertension: Secondary | ICD-10-CM | POA: Diagnosis present

## 2020-04-01 DIAGNOSIS — H9190 Unspecified hearing loss, unspecified ear: Secondary | ICD-10-CM | POA: Diagnosis present

## 2020-04-01 DIAGNOSIS — I5023 Acute on chronic systolic (congestive) heart failure: Secondary | ICD-10-CM | POA: Diagnosis not present

## 2020-04-01 DIAGNOSIS — J90 Pleural effusion, not elsewhere classified: Secondary | ICD-10-CM | POA: Diagnosis not present

## 2020-04-01 DIAGNOSIS — I509 Heart failure, unspecified: Secondary | ICD-10-CM

## 2020-04-01 DIAGNOSIS — I132 Hypertensive heart and chronic kidney disease with heart failure and with stage 5 chronic kidney disease, or end stage renal disease: Secondary | ICD-10-CM | POA: Diagnosis not present

## 2020-04-01 DIAGNOSIS — B955 Unspecified streptococcus as the cause of diseases classified elsewhere: Secondary | ICD-10-CM | POA: Diagnosis present

## 2020-04-01 DIAGNOSIS — E782 Mixed hyperlipidemia: Secondary | ICD-10-CM | POA: Diagnosis not present

## 2020-04-01 DIAGNOSIS — E8889 Other specified metabolic disorders: Secondary | ICD-10-CM | POA: Diagnosis present

## 2020-04-01 DIAGNOSIS — F32A Depression, unspecified: Secondary | ICD-10-CM | POA: Diagnosis not present

## 2020-04-01 DIAGNOSIS — E119 Type 2 diabetes mellitus without complications: Secondary | ICD-10-CM

## 2020-04-01 DIAGNOSIS — R4182 Altered mental status, unspecified: Secondary | ICD-10-CM

## 2020-04-01 DIAGNOSIS — R404 Transient alteration of awareness: Secondary | ICD-10-CM | POA: Diagnosis not present

## 2020-04-01 DIAGNOSIS — N186 End stage renal disease: Secondary | ICD-10-CM | POA: Diagnosis not present

## 2020-04-01 DIAGNOSIS — I110 Hypertensive heart disease with heart failure: Secondary | ICD-10-CM | POA: Diagnosis not present

## 2020-04-01 DIAGNOSIS — Z794 Long term (current) use of insulin: Secondary | ICD-10-CM

## 2020-04-01 DIAGNOSIS — R8271 Bacteriuria: Secondary | ICD-10-CM | POA: Diagnosis present

## 2020-04-01 DIAGNOSIS — R6889 Other general symptoms and signs: Secondary | ICD-10-CM | POA: Diagnosis not present

## 2020-04-01 DIAGNOSIS — E66812 Obesity, class 2: Secondary | ICD-10-CM | POA: Diagnosis present

## 2020-04-01 DIAGNOSIS — J9622 Acute and chronic respiratory failure with hypercapnia: Secondary | ICD-10-CM

## 2020-04-01 LAB — BASIC METABOLIC PANEL
Anion gap: 15 (ref 5–15)
BUN: 39 mg/dL — ABNORMAL HIGH (ref 8–23)
CO2: 18 mmol/L — ABNORMAL LOW (ref 22–32)
Calcium: 8.2 mg/dL — ABNORMAL LOW (ref 8.9–10.3)
Chloride: 89 mmol/L — ABNORMAL LOW (ref 98–111)
Creatinine, Ser: 3.79 mg/dL — ABNORMAL HIGH (ref 0.61–1.24)
GFR, Estimated: 16 mL/min — ABNORMAL LOW (ref >60)
Glucose, Bld: 135 mg/dL — ABNORMAL HIGH (ref 70–99)
Potassium: 4.6 mmol/L (ref 3.5–5.1)
Sodium: 122 mmol/L — ABNORMAL LOW (ref 135–145)

## 2020-04-01 LAB — LACTIC ACID, PLASMA: Lactic Acid, Venous: 1.1 mmol/L (ref 0.5–1.9)

## 2020-04-01 LAB — COMPREHENSIVE METABOLIC PANEL
ALT: 24 U/L (ref 0–44)
AST: 21 U/L (ref 15–41)
Albumin: 2.3 g/dL — ABNORMAL LOW (ref 3.5–5.0)
Alkaline Phosphatase: 161 U/L — ABNORMAL HIGH (ref 38–126)
Anion gap: 10 (ref 5–15)
BUN: 40 mg/dL — ABNORMAL HIGH (ref 8–23)
CO2: 24 mmol/L (ref 22–32)
Calcium: 8.3 mg/dL — ABNORMAL LOW (ref 8.9–10.3)
Chloride: 87 mmol/L — ABNORMAL LOW (ref 98–111)
Creatinine, Ser: 3.97 mg/dL — ABNORMAL HIGH (ref 0.61–1.24)
GFR, Estimated: 15 mL/min — ABNORMAL LOW (ref >60)
Glucose, Bld: 135 mg/dL — ABNORMAL HIGH (ref 70–99)
Potassium: 4 mmol/L (ref 3.5–5.1)
Sodium: 121 mmol/L — ABNORMAL LOW (ref 135–145)
Total Bilirubin: 1 mg/dL (ref 0.3–1.2)
Total Protein: 6.8 g/dL (ref 6.5–8.1)

## 2020-04-01 LAB — URINALYSIS, COMPLETE (UACMP) WITH MICROSCOPIC
Bilirubin Urine: NEGATIVE
Glucose, UA: NEGATIVE mg/dL
Ketones, ur: NEGATIVE mg/dL
Nitrite: NEGATIVE
Protein, ur: 30 mg/dL — AB
Specific Gravity, Urine: 1.011 (ref 1.005–1.030)
WBC, UA: >50 WBC/hpf — ABNORMAL HIGH (ref 0–5)
pH: 5 (ref 5.0–8.0)

## 2020-04-01 LAB — CBC
HCT: 29.5 % — ABNORMAL LOW (ref 39.0–52.0)
Hemoglobin: 8.6 g/dL — ABNORMAL LOW (ref 13.0–17.0)
MCH: 29.7 pg (ref 26.0–34.0)
MCHC: 29.2 g/dL — ABNORMAL LOW (ref 30.0–36.0)
MCV: 101.7 fL — ABNORMAL HIGH (ref 80.0–100.0)
Platelets: 173 10*3/uL (ref 150–400)
RBC: 2.9 MIL/uL — ABNORMAL LOW (ref 4.22–5.81)
RDW: 17.2 % — ABNORMAL HIGH (ref 11.5–15.5)
WBC: 7.7 10*3/uL (ref 4.0–10.5)
nRBC: 0 % (ref 0.0–0.2)

## 2020-04-01 LAB — BLOOD GAS, ARTERIAL
Acid-Base Excess: 0.5 mmol/L (ref 0.0–2.0)
Acid-base deficit: 0.9 mmol/L (ref 0.0–2.0)
Bicarbonate: 22.8 mmol/L (ref 20.0–28.0)
Bicarbonate: 24.3 mmol/L (ref 20.0–28.0)
Delivery systems: POSITIVE
FIO2: 80
FIO2: 40
O2 Saturation: 99.7 %
O2 Saturation: 97 %
Patient temperature: 37
Patient temperature: 37
RATE: 18 resp/min
pCO2 arterial: 64.5 mmHg — ABNORMAL HIGH (ref 32.0–48.0)
pCO2 arterial: 62.1 mmHg — ABNORMAL HIGH (ref 32.0–48.0)
pH, Arterial: 7.226 — ABNORMAL LOW (ref 7.350–7.450)
pH, Arterial: 7.258 — ABNORMAL LOW (ref 7.350–7.450)
pO2, Arterial: 269 mmHg — ABNORMAL HIGH (ref 83.0–108.0)
pO2, Arterial: 100 mmHg (ref 83.0–108.0)

## 2020-04-01 LAB — RESP PANEL BY RT PCR (RSV, FLU A&B, COVID)
Influenza A by PCR: NEGATIVE
Influenza B by PCR: NEGATIVE
Respiratory Syncytial Virus by PCR: NEGATIVE
SARS Coronavirus 2 by RT PCR: NEGATIVE

## 2020-04-01 LAB — CBC WITH DIFFERENTIAL/PLATELET
Abs Immature Granulocytes: 0.02 10*3/uL (ref 0.00–0.07)
Basophils Absolute: 0 10*3/uL (ref 0.0–0.1)
Basophils Relative: 0 %
Eosinophils Absolute: 0.2 10*3/uL (ref 0.0–0.5)
Eosinophils Relative: 3 %
HCT: 27.7 % — ABNORMAL LOW (ref 39.0–52.0)
Hemoglobin: 8.4 g/dL — ABNORMAL LOW (ref 13.0–17.0)
Immature Granulocytes: 0 %
Lymphocytes Relative: 4 %
Lymphs Abs: 0.3 10*3/uL — ABNORMAL LOW (ref 0.7–4.0)
MCH: 30.2 pg (ref 26.0–34.0)
MCHC: 30.3 g/dL (ref 30.0–36.0)
MCV: 99.6 fL (ref 80.0–100.0)
Monocytes Absolute: 0.3 10*3/uL (ref 0.1–1.0)
Monocytes Relative: 5 %
Neutro Abs: 5.9 10*3/uL (ref 1.7–7.7)
Neutrophils Relative %: 88 %
Platelets: 195 10*3/uL (ref 150–400)
RBC: 2.78 MIL/uL — ABNORMAL LOW (ref 4.22–5.81)
RDW: 17.3 % — ABNORMAL HIGH (ref 11.5–15.5)
WBC: 6.8 10*3/uL (ref 4.0–10.5)
nRBC: 0 % (ref 0.0–0.2)

## 2020-04-01 LAB — SARS CORONAVIRUS 2 (TAT 6-24 HRS): SARS Coronavirus 2: NEGATIVE

## 2020-04-01 LAB — ETHANOL: Alcohol, Ethyl (B): <10 mg/dL (ref <10)

## 2020-04-01 LAB — CBG MONITORING, ED: Glucose-Capillary: 129 mg/dL — ABNORMAL HIGH (ref 70–99)

## 2020-04-01 LAB — AMMONIA: Ammonia: 66 umol/L — ABNORMAL HIGH (ref 9–35)

## 2020-04-01 MED ORDER — ALBUMIN HUMAN 25 % IV SOLN
25.0000 g | Freq: Once | INTRAVENOUS | Status: AC
  Administered 2020-04-01: 25 g via INTRAVENOUS
  Filled 2020-04-01: qty 100

## 2020-04-01 MED ORDER — LACTULOSE 10 GM/15ML PO SOLN
10.0000 g | Freq: Once | ORAL | Status: DC
  Filled 2020-04-01: qty 30

## 2020-04-01 MED ORDER — IOHEXOL 350 MG/ML SOLN
100.0000 mL | Freq: Once | INTRAVENOUS | Status: AC | PRN
  Administered 2020-04-01: 100 mL via INTRAVENOUS

## 2020-04-01 MED ORDER — LIDOCAINE-PRILOCAINE 2.5-2.5 % EX CREA: 1.0000 "application " | TOPICAL_CREAM | CUTANEOUS | Status: DC | PRN

## 2020-04-01 MED ORDER — FUROSEMIDE 10 MG/ML IJ SOLN
120.0000 mg | Freq: Once | INTRAVENOUS | Status: AC
  Administered 2020-04-02: 120 mg via INTRAVENOUS
  Filled 2020-04-01: qty 12

## 2020-04-01 MED ORDER — LIDOCAINE HCL (PF) 1 % IJ SOLN: 5.0000 mL | INTRAMUSCULAR | Status: DC | PRN

## 2020-04-01 MED ORDER — SODIUM CHLORIDE 0.9 % IV SOLN: 100.0000 mL | INTRAVENOUS | Status: DC | PRN

## 2020-04-01 MED ORDER — PENTAFLUOROPROP-TETRAFLUOROETH EX AERO: 1.0000 "application " | INHALATION_SPRAY | CUTANEOUS | Status: DC | PRN

## 2020-04-01 NOTE — ED Notes (Signed)
Respiratory called for Bipap

## 2020-04-01 NOTE — ED Triage Notes (Signed)
EMS Called to house, pt was lethargic, has not had dialysis since Friday. Completing bowel prep for colonoscopy for tomorrow. Has not a BM with the prep.

## 2020-04-01 NOTE — ED Provider Notes (Signed)
Care assumed from Kaufman, PA-C at shift change with CTA and labs pending.   In brief, this patient is a 74 y.o. M with PMH/o ESRD (MWF Dialysis), alcoholic cirrhosis, atrial fibrillation, chronic hypoxic respiratory failure on 2 L of oxygen who presents for evaluation of generalized weakness, somnolence.  EMS was called by wife for concerns that patient was more somnolent than normal.  On EMS arrival, he was unable to stand up from bed because he was somnolent.  Wife states that he is a Monday, Wednesday, Friday dialysis patient.  He last had dialysis on Friday.  He did not get dialysis today.  He was doing a colonoscopy prep and has been taking laxatives and drinking to prepare for it.  He has not had a bowel movement despite taking laxatives.  On EMS arrival, patient was hypoxic with O2 sats in the 70s and 80s.  He was put on oxygen had some improvement. He is on 2L of O2 at baseline. Please see note from previous provider for full history/physical exam.    Physical Exam  BP (!) 123/50   Pulse 74   Temp 97.7 F (36.5 C) (Axillary)   Resp 13   Ht 5\' 6"  (1.676 m)   Wt 108.4 kg   SpO2 97%   BMI 38.57 kg/m   Physical Exam  2+ pitting edema noted to BLE   ED Course/Procedures     Procedures   Results for orders placed or performed during the hospital encounter of 04/01/20 (from the past 24 hour(s))  Blood gas, arterial (at [facility])     Status: Abnormal   Collection Time: 04/01/20  6:24 PM  Result Value Ref Range   FIO2 80.00    pH, Arterial 7.226 (L) 7.35 - 7.45   pCO2 arterial 64.5 (H) 32 - 48 mmHg   pO2, Arterial 269 (H) 83 - 108 mmHg   Bicarbonate 22.8 20.0 - 28.0 mmol/L   Acid-base deficit 0.9 0.0 - 2.0 mmol/L   O2 Saturation 99.7 %   Patient temperature 37.0    Allens test (pass/fail) PASS PASS  CBG monitoring, ED     Status: Abnormal   Collection Time: 04/01/20  6:38 PM  Result Value Ref Range   Glucose-Capillary 129 (H) 70 - 99 mg/dL  Comprehensive metabolic  panel     Status: Abnormal   Collection Time: 04/01/20  7:01 PM  Result Value Ref Range   Sodium 121 (L) 135 - 145 mmol/L   Potassium 4.0 3.5 - 5.1 mmol/L   Chloride 87 (L) 98 - 111 mmol/L   CO2 24 22 - 32 mmol/L   Glucose, Bld 135 (H) 70 - 99 mg/dL   BUN 40 (H) 8 - 23 mg/dL   Creatinine, Ser 3.97 (H) 0.61 - 1.24 mg/dL   Calcium 8.3 (L) 8.9 - 10.3 mg/dL   Total Protein 6.8 6.5 - 8.1 g/dL   Albumin 2.3 (L) 3.5 - 5.0 g/dL   AST 21 15 - 41 U/L   ALT 24 0 - 44 U/L   Alkaline Phosphatase 161 (H) 38 - 126 U/L   Total Bilirubin 1.0 0.3 - 1.2 mg/dL   GFR, Estimated 15 (L) >60 mL/min   Anion gap 10 5 - 15  Ammonia     Status: Abnormal   Collection Time: 04/01/20  7:01 PM  Result Value Ref Range   Ammonia 66 (H) 9 - 35 umol/L  Lactic acid, plasma     Status: None  Collection Time: 04/01/20  7:01 PM  Result Value Ref Range   Lactic Acid, Venous 1.1 0.5 - 1.9 mmol/L  Ethanol     Status: None   Collection Time: 04/01/20  7:01 PM  Result Value Ref Range   Alcohol, Ethyl (B) <10 <10 mg/dL  CBC with Differential/Platelet     Status: Abnormal   Collection Time: 04/01/20  7:01 PM  Result Value Ref Range   WBC 6.8 4.0 - 10.5 K/uL   RBC 2.78 (L) 4.22 - 5.81 MIL/uL   Hemoglobin 8.4 (L) 13.0 - 17.0 g/dL   HCT 27.7 (L) 39 - 52 %   MCV 99.6 80.0 - 100.0 fL   MCH 30.2 26.0 - 34.0 pg   MCHC 30.3 30.0 - 36.0 g/dL   RDW 17.3 (H) 11.5 - 15.5 %   Platelets 195 150 - 400 K/uL   nRBC 0.0 0.0 - 0.2 %   Neutrophils Relative % 88 %   Neutro Abs 5.9 1.7 - 7.7 K/uL   Lymphocytes Relative 4 %   Lymphs Abs 0.3 (L) 0.7 - 4.0 K/uL   Monocytes Relative 5 %   Monocytes Absolute 0.3 0.1 - 1.0 K/uL   Eosinophils Relative 3 %   Eosinophils Absolute 0.2 0.0 - 0.5 K/uL   Basophils Relative 0 %   Basophils Absolute 0.0 0.0 - 0.1 K/uL   Immature Granulocytes 0 %   Abs Immature Granulocytes 0.02 0.00 - 0.07 K/uL  Blood Cultures (routine x 2)     Status: None (Preliminary result)   Collection Time: 04/01/20   7:03 PM   Specimen: Vein; Blood  Result Value Ref Range   Specimen Description RIGHT ANTECUBITAL    Special Requests      BOTTLES DRAWN AEROBIC AND ANAEROBIC Blood Culture adequate volume Performed at [facility], [address] [place], [place] [address]    Culture PENDING    Report Status PENDING   Blood Cultures (routine x 2)     Status: None (Preliminary result)   Collection Time: 04/01/20  7:03 PM   Specimen: Vein; Blood  Result Value Ref Range   Specimen Description BLOOD RIGHT HAND    Special Requests      BOTTLES DRAWN AEROBIC AND ANAEROBIC Blood Culture adequate volume Performed at [facility], [address] [place], [place] [address]    Culture PENDING    Report Status PENDING   Urinalysis, Complete w Microscopic Urine, Catheterized     Status: Abnormal   Collection Time: 04/01/20  7:45 PM  Result Value Ref Range   Color, Urine YELLOW YELLOW   APPearance TURBID (A) CLEAR   Specific Gravity, Urine 1.011 1.005 - 1.030   pH 5.0 5.0 - 8.0   Glucose, UA NEGATIVE NEGATIVE mg/dL   Hgb urine dipstick SMALL (A) NEGATIVE   Bilirubin Urine NEGATIVE NEGATIVE   Ketones, ur NEGATIVE NEGATIVE mg/dL   Protein, ur 30 (A) NEGATIVE mg/dL   Nitrite NEGATIVE NEGATIVE   Leukocytes,Ua LARGE (A) NEGATIVE   RBC / HPF 6-10 0 - 5 RBC/hpf   WBC, UA >50 (H) 0 - 5 WBC/hpf   Bacteria, UA MANY (A) NONE SEEN   WBC Clumps PRESENT    Amorphous Crystal PRESENT   Resp Panel by RT PCR (RSV, Flu A&B, Covid) - Nasopharyngeal Swab     Status: None   Collection Time: 04/01/20  7:45 PM   Specimen: Nasopharyngeal Swab  Result Value Ref Range   SARS Coronavirus 2 by RT PCR NEGATIVE NEGATIVE  Influenza A by PCR NEGATIVE NEGATIVE   Influenza B by PCR NEGATIVE NEGATIVE   Respiratory Syncytial Virus by PCR NEGATIVE NEGATIVE  Blood gas, arterial     Status: Abnormal   Collection Time: 04/01/20 11:20 PM  Result Value Ref Range   FIO2 40.00    Delivery systems BILEVEL POSITIVE AIRWAY PRESSURE    LHR  18 resp/min   pH, Arterial 7.258 (L) 7.35 - 7.45   pCO2 arterial 62.1 (H) 32 - 48 mmHg   pO2, Arterial 100 83 - 108 mmHg   Bicarbonate 24.3 20.0 - 28.0 mmol/L   Acid-Base Excess 0.5 0.0 - 2.0 mmol/L   O2 Saturation 97.0 %   Patient temperature 37.0    Collection site RADIAL    Drawn by RF    Allens test (pass/fail) PASS PASS    MDM   PLAN: Plan for labs, CT imaging.  Will consult nephrology regarding emergent dialysis.  MDM: Discussed patient with Dr. Candiss Norse (Neprhology) regarding emergent dialysis. He was aware of patient in the ED. He will coordinate with dialysis nurse regarding emergent dialysis. He recommends giving 120 mg of IV LASIX.    Chest x-ray shows findings consistent with congestive heart failure. CT head shows no acute intracranial abnormality. CT of chest shows no evidence of PE. Has bilateral pleural effusions with bilateral lower lobe consolidation left greater than right. CT of abdomen shows changes of cirrhosis of the liver subcu portal hypertension splenomegaly. Moderate ascites.  Covid test is negative. UA shows small hemoglobin. Large leukocytes, pyuria. Ethanol level is unremarkable. Ammonia level is 66. CBC shows no leukocytosis. Hemoglobin is 8.4. CMP shows BUN of 40, creatinine of 3.97. Sodium is 121. VBG shows pH of 7.2. PCO2 of 64.5. Ammonia is 66. Lactulose ordered.   Patient receiving emergent dialysis at this point. He is still on Bipap. Plan for admission.    1. Altered mental status, unspecified altered mental status type   2. Acute on chronic congestive heart failure, unspecified heart failure type [facility])     Portions of this note were generated with Dragon dictation software. Dictation errors may occur despite best attempts at proofreading.        Volanda Napoleon, PA-C 04/02/20 0050    Daleen Bo, MD 04/02/20 1216    Daleen Bo, MD 04/02/20 1233

## 2020-04-01 NOTE — ED Provider Notes (Signed)
[facility] EMERGENCY DEPARTMENT Provider Note   CSN: [id number] Arrival date & time: 04/01/20  1820     History No chief complaint on file.   Jeffrey [name] is a 74 y.o. male.  With a past medical history of alcoholic cirrhosis, atrial fibrillation, end-stage renal disease on hemodialysis Monday Wednesday and Friday, diabetes, chronic hypoxic respiratory failure on 2 L of oxygen who presents to the emergency department with weakness.  There is a level 5 caveat due to significant somnolence.  According to EMS they were called out for weakness to the patient's house.  He was "slumped halfway on and halfway of the bed unable to stand up."  He last dialyzed this past Friday.  He was getting ready for colonoscopy and has been taking laxative all day.  According to EMS the patient has chronic hypoxic respiratory failure and is always on 2 L.  They noted that his hands were very cold and they were unable to assess an accurate oxygen saturation however he seemed to be dropping down into the 70s and he is currently on 8 L via nasal cannula with an oxygen saturation of 87%.  The patient is difficult to arouse however arises to loud voice and shaking.  He does state that he is vaccinated against the coronavirus.  He is denying chest pain.  He has not made a bowel movement today despite a lot of laxatives.  He denies abdominal pain.  HPI     Past Medical History:  Diagnosis Date  . A-fib [facility])    when initially stating dialysis january 2021 at [facility] per spouse; never heard anything else about it  . Alcoholic cirrhosis ([place])    patient reports completing hep a and b vaccines in 2006  . Anemia    has had 3 units of prbcs 2013  . Anxiety   . Arthritis   . Asymptomatic gallstones    Ultrasound in 2006  . C. difficile colitis   . Cholelithiasis   . Chronic kidney disease    Dialysis M/W/F/Sa  . Depression   . Diabetes ([place])   . Duodenal ulcer with hemorrhage    per patient in 2006 or  2007, records have been requested  . Dyspnea    low oxygen level -  has O2 2 L all day  . 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 GERD (gastroesophageal reflux disease)   . Heart burn   . 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 HOH (hard of hearing)   . Hypertension   . Liver cirrhosis ([place])   . Splenomegaly    Ultrasound in 2006    Patient Active Problem List   Diagnosis Date Noted  . Right upper quadrant abdominal pain 02/22/2020  . Urine leukocytes increased 02/22/2020  . Fall   . Minor head injury   . Chronic diastolic HF (heart failure) ([place])   . Physical deconditioning   . Acute hyponatremia 02/19/2020  . Fever and chills 02/09/2020  . Advanced care planning/counseling discussion   . DNR (do not resuscitate)   . Alcoholic cirrhosis of liver ([place]) 02/08/2020  . Back pain 02/08/2020  . Status post thoracentesis   . Hepatic encephalopathy ([place]) 01/01/2020  . Bilateral pleural effusion 12/31/2019  . Hypoglycemia 12/31/2019  . Hyperammonemia ([place]) 12/31/2019  . Obesity (BMI 30-39.9) 12/31/2019  . Hypoalbuminemia 12/31/2019  . Nosebleed   . Goals of care, counseling/discussion   . Palliative care by specialist   .  Acute hepatic encephalopathy 12/13/2019  . Encephalopathy acute   . Hyperkalemia   . Chronic hyponatremia   . Anemia 11/16/2019  . Acute blood loss anemia 09/20/2019  . Gastrointestinal hemorrhage   . Acute on chronic anemia 09/18/2019  . Generalized weakness 09/18/2019  . Type 2 diabetes mellitus ([place]) 09/18/2019  . 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[place]) 09/18/2019  . Enteritis due to Clostridium difficile 08/31/2019  . Edema 04/11/2019  . Alcoholic cirrhosis ([place]) 65/46/5035  . Type 2 diabetes mellitus with stage 3 chronic kidney disease, with long-term current use of insulin ([place]) 10/20/2017  . Essential hypertension, benign 10/20/2017  . Morbid obesity ([place]) 10/20/2017  . Mixed hyperlipidemia 10/20/2017  . Ascites   . SBP (spontaneous  bacterial peritonitis) ([place]) 01/12/2016  . Shigella dysenteriae 01/11/2016  . Diarrhea 01/10/2016  . Chronic liver disease and cirrhosis ([place]) 09/05/2012  . Alcoholic cirrhosis of liver with ascites ([place]) 03/09/2012  . Heme positive stool 03/09/2012  . Anemia due to chronic blood loss 03/09/2012  . Pancytopenia ([place]) 03/09/2012  . FH: colon cancer 03/09/2012  . Esophageal varices in alcoholic cirrhosis ([place]) 46/56/8127    Past Surgical History:  Procedure Laterality Date  . AGILE CAPSULE N/A 09/20/2019   Procedure: AGILE CAPSULE;  Surgeon: Daneil Dolin, MD;  [facility];  Service: Endoscopy;  Laterality: N/A;  . APPENDECTOMY    . AV FISTULA PLACEMENT Left 08/17/2019   Procedure: ARTERIOVENOUS (AV) FISTULA CREATION VERSES ARTERIOVENOUS GRAFT;  Surgeon: [name], MD;  Location: [place];  Service: Vascular;  Laterality: Left;  . BASCILIC VEIN TRANSPOSITION Left 09/28/2019   Procedure: LEFT SECOND STAGE BASCILIC VEIN TRANSPOSITION;  Surgeon: [name], MD;  Location: [place];  Service: Vascular;  Laterality: Left;  . BIOPSY  02/03/2018   Procedure: BIOPSY;  Surgeon: Daneil Dolin, MD;  [facility];  Service: Endoscopy;;  bx of gastric polyps  . BIOPSY  09/19/2019   Procedure: BIOPSY;  Surgeon: Danie Binder, MD;  [facility];  Service: Endoscopy;;  . CATARACT EXTRACTION Right   . COLONOSCOPY  03/10/2005   Rectal polyp as described above, removed with snare. Left sided  diverticula. The remainder of the colonic mucosa appeared normal. Inflammed polyp on path.  . COLONOSCOPY  04/1999   Dr. Thea Silversmith polyps removed,  Path showed hyperplastic  . COLONOSCOPY  10/2015   Dr. Britta Mccreedy: diverticulosis, single sessile tubular adenoma 3-40mm in size removed from descending colon.   . COLONOSCOPY WITH ESOPHAGOGASTRODUODENOSCOPY (EGD)  03/31/2012   NTZ:GYFVCBS AVMs. Colonic diverticulosis. tubular adenoma colon  . COLONOSCOPY WITH ESOPHAGOGASTRODUODENOSCOPY  (EGD)  06/2019   FORSYTH: small esophageal varices without high risk stigmata, single large AVM on lesser curvature in gastric body s/p APC ablation, gastric antral and duodenal bulb polyposis. No significant source to explain transfusion dependent anemia. Colonoscopy with portal colopathy and diffuse edema, changes of Cdiff colitis on colonoscopy  . ESOPHAGEAL BANDING N/A 02/03/2018   Procedure: ESOPHAGEAL BANDING;  Surgeon: Daneil Dolin, MD;  [facility];  Service: Endoscopy;  Laterality: N/A;  . ESOPHAGOGASTRODUODENOSCOPY  01/15/2005   Three columns grade 1 to 2 esophageal varices, otherwise normal esophageal mucosa.  Esophagus was not manipulated otherwise./Nodularity of the antrum with overlying erosions, nonspecific finding. Path showed rare H.pylori  . ESOPHAGOGASTRODUODENOSCOPY  09/2008   Dr. Gaylene Brooks columns of grade 2-3 esoph varices, only one column was prominent. Portal gastropathy, multiple gastrc polyps at antrum, two were 2cm with black eschar,  bulbar polyps, bulbar erosions  . ESOPHAGOGASTRODUODENOSCOPY  11/2004   Dr. Brantley Stage 3 esoph varices  . ESOPHAGOGASTRODUODENOSCOPY  03/31/2012   RMR: 4 columns(3-GR 2, 1-GR1) non-bleeding esophageal varices, portal gastropathy, small HH, early GAVE, multiple gastric polyps   . ESOPHAGOGASTRODUODENOSCOPY (EGD) WITH PROPOFOL N/A 02/03/2018   Dr. Gala Romney: Esophageal varices, 3 columns grade 1-2.  Portal hypertensive gastropathy.  Multiple gastric polyps, biopsy consistent with hyperplastic.  [name] ESOPHAGOGASTRODUODENOSCOPY (EGD) WITH PROPOFOL N/A 09/19/2019   Fields: grade I and II esophageal varcies, mild portal hypertensive gastropathy, moderate gastritis but no H. pylori, single hyperplastic gastric polyp removed, obvious source for melena/transfusion dependent anemia not identified, may be due to friable gastric and duodenal mucosa in the setting of portal hypertension  . GASTRIC VARICES BANDING  03/31/2012   Procedure:  GASTRIC VARICES BANDING;  Surgeon: Daneil Dolin, MD;  [facility];  Service: Endoscopy;  Laterality: N/A;  . GIVENS CAPSULE STUDY N/A 09/21/2019   Poor study with a lot of debris obstructing much of the view of the first approximate 3 hours out of 6 hours.  No obvious source of bleeding identified.  Sequela of bleeding and old blood seen in the form of"whisps" of blood, blood flecks mostly toward the end of the study.  . IR REMOVAL TUN CV CATH W/O FL  02/15/2020  . POLYPECTOMY  09/19/2019   Procedure: POLYPECTOMY;  Surgeon: Danie Binder, MD;  [facility];  Service: Endoscopy;;  . UMBILICAL HERNIA REPAIR  2017   exploratory laparotomy, abdominal washout       Family History  Problem Relation Age of Onset  . Colon cancer Father 33       deceased age 34  . Breast cancer Sister        deceased  . Diabetes Sister   . Stroke Mother   . Healthy Son   . Healthy Daughter   . Lung cancer Neg Hx   . Ovarian cancer Neg Hx     Social History   Tobacco Use  . Smoking status: Former Smoker    Packs/day: 2.00    Years: 25.00    Pack years: 50.00    Types: Cigarettes    Quit date: 01/28/1986    Years since quitting: 34.1  . Smokeless tobacco: Never Used  Vaping Use  . Vaping Use: Never used  Substance Use Topics  . Alcohol use: No    Comment: quit in 05/2013  . Drug use: No    Home Medications Prior to Admission medications   Medication Sig Start Date End Date Taking? Authorizing Provider  acetaminophen (TYLENOL) 500 MG tablet Take 1,000 mg by mouth every 6 (six) hours as needed for mild pain.     [provider]  Alcohol Swabs (ALCOHOL PREP) 70 % PADS Apply 1 each topically 3 (three) times daily. 01/31/20   [provider]  Ascorbic Acid (VITAMIN C) 1000 MG tablet Take 1,000 mg by mouth 2 (two) times daily.    [provider]  cholestyramine Lucrezia Starch) 4 GM/DOSE powder Take 1 packet by mouth daily as needed for diarrhea or loose  stools. 03/28/20   [provider]  diltiazem (CARDIZEM CD) 120 MG 24 hr capsule Take 1 capsule (120 mg total) by mouth daily. 02/10/20 02/09/21  Roxan Hockey, MD  furosemide (LASIX) 40 MG tablet Take 40 mg by mouth 2 (two) times daily. 12/29/19   [provider]  gabapentin (NEURONTIN) 100 MG capsule TAKE 1 CAPSULE BY MOUTH  AT BEDTIME Patient taking differently: Take 100 mg by mouth at bedtime.  01/30/20   Waynetta Sandy, MD  HYDROcodone-acetaminophen (NORCO/VICODIN) 5-325 MG tablet Take 1 tablet by mouth 2 (two) times daily as needed for moderate pain or severe pain.  01/16/20   [provider]  hydrocortisone (ANUSOL-HC) 2.5 % rectal cream Place 1 application rectally 2 (two) times daily. Patient taking differently: Place 1 application rectally 2 (two) times daily as needed for hemorrhoids or anal itching.  03/27/20   Erenest Rasher, PA-C  hydrOXYzine (VISTARIL) 25 MG capsule Take 25 mg by mouth 3 (three) times daily as needed for itching.  01/23/20   [provider]  insulin degludec (TRESIBA FLEXTOUCH) 200 UNIT/ML FlexTouch Pen Inject 120 Units into the skin at bedtime. When blood sugars are up Patient taking differently: Inject 120 Units into the skin at bedtime.  02/20/20   Barton Dubois, MD  iron polysaccharides (NIFEREX) 150 MG capsule Take 150 mg by mouth at bedtime.     [provider]  lactulose (CHRONULAC) 10 GM/15ML solution Take 45 mLs (30 g total) by mouth 3 (three) times daily. Patient taking differently: Take 20 g by mouth 2 (two) times daily as needed for moderate constipation.  01/06/20   Barton Dubois, MD  metoprolol succinate (TOPROL-XL) 25 MG 24 hr tablet Take 1 tablet (25 mg total) by mouth daily. Take at bedtime  on Monday ,Wednesday and Friday due to dialaylisis Patient taking differently: Take 25 mg by mouth See admin instructions. Take 25 mg by mouth at bedtime every night and 25 mg in morning on  Sun, Tues, Thurs and  Sat 01/06/20   Barton Dubois, MD  milk thistle 175 MG tablet Take 175 mg by mouth 3 (three) times daily.     [provider]  mirtazapine (REMERON) 30 MG tablet Take 1 tablet (30 mg total) by mouth at bedtime. 02/10/20   Roxan Hockey, MD  Multiple Vitamin (MULTIVITAMIN WITH MINERALS) TABS tablet Take 1 tablet by mouth in the morning.     [provider]  NOVOFINE 32G X 6 MM MISC Inject into the skin. 01/31/20   [provider]  nystatin (MYCOSTATIN/NYSTOP) powder Apply topically 3 (three) times daily. Into groin area and affected area Patient not taking: Reported on 03/29/2020 01/06/20   Barton Dubois, MD  omega-3 acid ethyl esters (LOVAZA) 1 g capsule Take 2 g by mouth 2 (two) times daily.  04/07/19   [provider]  omeprazole (PRILOSEC) 40 MG capsule Take 40 mg by mouth in the morning.     [provider]  ondansetron (ZOFRAN) 4 MG tablet Take 4 mg by mouth every 8 (eight) hours as needed for nausea or vomiting.     [provider]  oxyCODONE-acetaminophen (PERCOCET/ROXICET) 5-325 MG tablet Take 1 tablet by mouth 2 (two) times daily as needed for moderate pain.  02/27/20   [provider]  pravastatin (PRAVACHOL) 20 MG tablet Take 20 mg by mouth in the morning.     [provider]  Probiotic Product (PROBIOTIC DAILY PO) Take 1 tablet by mouth in the morning.     [provider]  spironolactone (ALDACTONE) 50 MG tablet Take 50 mg by mouth in the morning.     [provider]  tamsulosin (FLOMAX) 0.4 MG CAPS capsule Take 0.4 mg by mouth every evening.    [provider]  thiamine 100 MG tablet Take 100 mg by mouth in the morning. Vitamin B-1  Patient not taking: Reported on 03/29/2020    [provider]  thiamine 100 MG tablet Take 100 mg by mouth daily. 02/29/20   [provider]  tiZANidine (ZANAFLEX) 2 MG tablet Take 2 mg by mouth 2 (two) times daily as needed for muscle spasms.   02/29/20   [provider]  triamcinolone cream (KENALOG) 0.1 % Apply 1 application topically 2 (two) times daily as needed (dry skin Itching).  12/30/17   [provider]  venlafaxine (EFFEXOR) 75 MG tablet Take 75 mg by mouth 3 (three) times daily with meals.     [provider]  vitamin B-12 (CYANOCOBALAMIN) 1000 MCG tablet Take 1,000 mcg by mouth daily.    [provider]  XIFAXAN 550 MG TABS tablet TAKE 1 TABLET BY MOUTH TWICE DAILY Patient taking differently: Take 550 mg by mouth 2 (two) times daily.  02/29/20   Mahala Menghini, PA-C    Allergies    Chlorhexidine and Tape  Review of Systems   Review of Systems  Unable to perform ROS: Mental status change    Physical Exam Updated Vital Signs BP (!) 131/59 (BP Location: Right Arm)   Pulse 82   Temp 98.6 F (37 C) (Oral)   Resp 14   SpO2 (!) 87%   Physical Exam Vitals and nursing note reviewed.  Constitutional:      General: He is not in acute distress.    Appearance: He is well-developed. He is obese. He is ill-appearing. He is not diaphoretic.     Comments: Sallow skin  HENT:     Head: Normocephalic and atraumatic.  Eyes:     General: Scleral icterus present.  Cardiovascular:     Rate and Rhythm: Normal rate and regular rhythm.     Heart sounds: Normal heart sounds.  Pulmonary:     Effort: Pulmonary effort is normal.     Breath sounds: Normal breath sounds.  Abdominal:     General: There is distension.     Palpations: Abdomen is soft.     Tenderness: There is no abdominal tenderness.  Musculoskeletal:     Cervical back: Normal range of motion and neck supple.  Skin:    General: Skin is warm and dry.  Neurological:     Mental Status: He is lethargic.     GCS: GCS eye subscore is 3. GCS verbal subscore is 4. GCS motor subscore is 6.  Psychiatric:        Behavior: Behavior normal.     ED Results / Procedures / Treatments   Labs (all labs ordered are listed, but only abnormal  results are displayed) Labs Reviewed  CULTURE, BLOOD (ROUTINE X 2)  CULTURE, BLOOD (ROUTINE X 2)  RESPIRATORY PANEL BY RT PCR (FLU A&B, COVID)  COMPREHENSIVE METABOLIC PANEL  CBC WITH DIFFERENTIAL/PLATELET  URINALYSIS, COMPLETE (UACMP) WITH MICROSCOPIC  AMMONIA  LACTIC ACID, PLASMA  BLOOD GAS, ARTERIAL  CBG MONITORING, ED    EKG EKG Interpretation  Date/Time:  Monday April 01 2020 18:20:02 EDT Ventricular Rate:  82 PR Interval:  188 QRS Duration: 98 QT Interval:  374 QTC Calculation: 436 R Axis:   16 Text Interpretation: Normal sinus rhythm Nonspecific T wave abnormality Abnormal ECG since last tracing no significant change Confirmed by Daleen Bo [phone]) on 04/01/2020 6:27:19 PM   Radiology No results found.  Procedures .Critical Care Performed by: Margarita Mail, PA-C Authorized by: Margarita Mail, PA-C   Critical care provider statement:    Critical care  time (minutes):  45   Critical care was necessary to treat or prevent imminent or life-threatening deterioration of the following conditions:  Respiratory failure and CNS failure or compromise   Critical care was time spent personally by me on the following activities:  Discussions with consultants, evaluation of patient's response to treatment, examination of patient, ordering and performing treatments and interventions, ordering and review of laboratory studies, ordering and review of radiographic studies, pulse oximetry, re-evaluation of patient's condition, obtaining history from patient or surrogate and review of old charts   (including critical care time)  Medications Ordered in ED Medications - No data to display  ED Course  I have reviewed the triage vital signs and the nursing notes.  Pertinent labs & imaging results that were available during my care of the patient were reviewed by me and considered in my medical decision making (see chart for details).    MDM Rules/Calculators/A&P                           CC:sma VS:BP 131/64   Pulse 81   Temp 98.1 F (36.7 C) (Rectal)   Resp (!) 21   Ht 5\' 6"  (1.676 m)   Wt 108.4 kg   SpO2 99%   BMI 38.58 kg/m   EP:PIRJJOA is gathered by EMS and EMR, wife at bedside. Previous records obtained and reviewed. DDX:The patient's complaint of altered mental status involves an extensive number of diagnostic and treatment options, and is a complaint that carries with it a high risk of complications, morbidity, and potential mortality. Given the large differential diagnosis, medical decision making is of high complexity. The differential diagnosis for AMS is extensive and includes, but is not limited to: drug overdose - opioids, alcohol, sedatives, antipsychotics, drug withdrawal, others; Metabolic: hypoxia, hypoglycemia, hyperglycemia, hypercalcemia, hypernatremia, hyponatremia, uremia, hepatic encephalopathy, hypothyroidism, hyperthyroidism, vitamin B12 or thiamine deficiency, carbon monoxide poisoning, Wilson's disease, Lactic acidosis, DKA/HHOS; Infectious: meningitis, encephalitis, bacteremia/sepsis, urinary tract infection, pneumonia, neurosyphilis; Structural: Space-occupying lesion, (brain tumor, subdural hematoma, hydrocephalus,); Vascular: stroke, subarachnoid hemorrhage, coronary ischemia, hypertensive encephalopathy, CNS vasculitis, thrombotic thrombocytopenic purpura, disseminated intravascular coagulation, hyperviscosity; Psychiatric: Schizophrenia, depression; Other: Seizure, hypothermia, heat stroke, ICU psychosis, dementia -"sundowning."   Labs:I ordered reviewed and interpreted labs which include CBC with mild normocytic anemia, CMP shows elevated creatinine and BUN in the setting of end-stage renal disease.  Respiratory panel negative for Covid influenza or RSV.  Urinalysis appears infected however this was taken from indwelling catheter.  Have asked that this be replaced and fresh sample take from new catheter.  Ammonia level elevated at 66.   ABG shows respiratory acidosis with hypercarbia.  Imaging: I ordered and reviewed images which included portable 1 view chest x-ray  I independently visualized and interpreted all imaging. Significant findings include large pleural effusion..  EKG: Normal sinus rhythm at a rate of 82 Patient here with altered mental status likely multifactorial.  Patient appears volume overloaded.  He has hypercarbic respiratory failure.  This may be mechanical due to abdominal distention or secondary to volume overload.  I have given signout to [facility] who will assume care.  Expect that the patient will need admission.        Final Clinical Impression(s) / ED Diagnoses Final diagnoses:  None    Rx / DC Orders ED Discharge Orders    None       Margarita Mail, PA-C 04/01/20 2159    Daleen Bo, MD 04/02/20 1215

## 2020-04-01 NOTE — Progress Notes (Signed)
Discussed with ER. P/w AMS/weak, acute on chronic hypoxia. Did not have dialysis today. Labs okay however hypervolemic. CXR reviewed. Will plan for HD tonight, limiting time to 3 hours given staffing and will aim for 3L of UF as tolerated. Lasix 120mg  IV x 1 dose now (still makes urine). Full consult note to follow in AM.  [name], MD [facility]

## 2020-04-01 NOTE — ED Notes (Signed)
Pt off unit to CT at this time

## 2020-04-01 NOTE — Telephone Encounter (Signed)
Called pt's wife, TCS for tomorrow moved up to 10:15am. Advised her he should arrive at 8:45am, NPO after midnight.

## 2020-04-01 NOTE — ED Provider Notes (Signed)
  Face-to-face evaluation   History: He presents for evaluation of altered mental status and trouble breathing.  He is being worked up for rectal bleeding with current bowel prep underway.  He was due to had Covid testing today as part of the preoperative testing.  Physical exam: Obese.  He is obtunded.  He responds slowly, but not directly to questions and stimulation.  He is currently in the hallway, nursing working on blood draw and IV access (6:30 PM).  No respiratory distress currently, on facemask oxygen, decreased breath sounds left anterior as compared to right.  Heart is not tachycardic.  Medical screening examination/treatment/procedure(s) were conducted as a shared visit with non-physician practitioner(s) and myself.  I personally evaluated the patient during the encounter    Daleen Bo, MD 04/02/20 1215

## 2020-04-01 NOTE — ED Notes (Signed)
Transported patient to CT and Back , Patient back on BiPAP

## 2020-04-01 NOTE — ED Notes (Signed)
Increased bipap rate after ABG to 22 , decreasd fio2 to 35

## 2020-04-02 ENCOUNTER — Encounter (HOSPITAL_COMMUNITY): Payer: Self-pay | Admitting: Internal Medicine

## 2020-04-02 ENCOUNTER — Encounter (HOSPITAL_COMMUNITY)
Admission: EM | Disposition: A | Discharge status: Home Health | Payer: Self-pay | Source: Home / Self Care | Attending: Internal Medicine

## 2020-04-02 ENCOUNTER — Inpatient Hospital Stay (HOSPITAL_COMMUNITY): Payer: Medicare Other

## 2020-04-02 ENCOUNTER — Ambulatory Visit (HOSPITAL_COMMUNITY): Admission: RE | Admit: 2020-04-02 | Payer: Medicare Other | Source: Home / Self Care

## 2020-04-02 DIAGNOSIS — I482 Chronic atrial fibrillation, unspecified: Secondary | ICD-10-CM | POA: Diagnosis present

## 2020-04-02 DIAGNOSIS — K769 Liver disease, unspecified: Secondary | ICD-10-CM

## 2020-04-02 DIAGNOSIS — J9622 Acute and chronic respiratory failure with hypercapnia: Secondary | ICD-10-CM | POA: Diagnosis present

## 2020-04-02 DIAGNOSIS — Z6837 Body mass index (BMI) 37.0-37.9, adult: Secondary | ICD-10-CM | POA: Diagnosis not present

## 2020-04-02 DIAGNOSIS — J9621 Acute and chronic respiratory failure with hypoxia: Secondary | ICD-10-CM | POA: Diagnosis present

## 2020-04-02 DIAGNOSIS — E871 Hypo-osmolality and hyponatremia: Secondary | ICD-10-CM | POA: Diagnosis present

## 2020-04-02 DIAGNOSIS — I132 Hypertensive heart and chronic kidney disease with heart failure and with stage 5 chronic kidney disease, or end stage renal disease: Secondary | ICD-10-CM | POA: Diagnosis present

## 2020-04-02 DIAGNOSIS — K652 Spontaneous bacterial peritonitis: Secondary | ICD-10-CM | POA: Diagnosis present

## 2020-04-02 DIAGNOSIS — R8271 Bacteriuria: Secondary | ICD-10-CM | POA: Diagnosis present

## 2020-04-02 DIAGNOSIS — E669 Obesity, unspecified: Secondary | ICD-10-CM | POA: Diagnosis present

## 2020-04-02 DIAGNOSIS — R7881 Bacteremia: Secondary | ICD-10-CM | POA: Diagnosis present

## 2020-04-02 DIAGNOSIS — F418 Other specified anxiety disorders: Secondary | ICD-10-CM | POA: Diagnosis present

## 2020-04-02 DIAGNOSIS — E8779 Other fluid overload: Secondary | ICD-10-CM

## 2020-04-02 DIAGNOSIS — K7290 Hepatic failure, unspecified without coma: Secondary | ICD-10-CM | POA: Diagnosis present

## 2020-04-02 DIAGNOSIS — J181 Lobar pneumonia, unspecified organism: Secondary | ICD-10-CM | POA: Diagnosis present

## 2020-04-02 DIAGNOSIS — N186 End stage renal disease: Secondary | ICD-10-CM | POA: Diagnosis present

## 2020-04-02 DIAGNOSIS — B961 Klebsiella pneumoniae [K. pneumoniae] as the cause of diseases classified elsewhere: Secondary | ICD-10-CM | POA: Diagnosis present

## 2020-04-02 DIAGNOSIS — E8770 Fluid overload, unspecified: Secondary | ICD-10-CM | POA: Diagnosis present

## 2020-04-02 DIAGNOSIS — Z20822 Contact with and (suspected) exposure to covid-19: Secondary | ICD-10-CM | POA: Diagnosis present

## 2020-04-02 DIAGNOSIS — F32A Depression, unspecified: Secondary | ICD-10-CM | POA: Diagnosis present

## 2020-04-02 DIAGNOSIS — G9341 Metabolic encephalopathy: Secondary | ICD-10-CM | POA: Diagnosis present

## 2020-04-02 DIAGNOSIS — I10 Essential (primary) hypertension: Secondary | ICD-10-CM | POA: Diagnosis not present

## 2020-04-02 DIAGNOSIS — I5033 Acute on chronic diastolic (congestive) heart failure: Secondary | ICD-10-CM | POA: Diagnosis present

## 2020-04-02 DIAGNOSIS — K7460 Unspecified cirrhosis of liver: Secondary | ICD-10-CM

## 2020-04-02 DIAGNOSIS — K7031 Alcoholic cirrhosis of liver with ascites: Secondary | ICD-10-CM | POA: Diagnosis present

## 2020-04-02 DIAGNOSIS — E782 Mixed hyperlipidemia: Secondary | ICD-10-CM

## 2020-04-02 DIAGNOSIS — L03115 Cellulitis of right lower limb: Secondary | ICD-10-CM | POA: Diagnosis present

## 2020-04-02 DIAGNOSIS — E1122 Type 2 diabetes mellitus with diabetic chronic kidney disease: Secondary | ICD-10-CM | POA: Diagnosis present

## 2020-04-02 DIAGNOSIS — K766 Portal hypertension: Secondary | ICD-10-CM | POA: Diagnosis present

## 2020-04-02 DIAGNOSIS — B955 Unspecified streptococcus as the cause of diseases classified elsewhere: Secondary | ICD-10-CM | POA: Diagnosis present

## 2020-04-02 LAB — BLOOD CULTURE ID PANEL (REFLEXED) - BCID2

## 2020-04-02 LAB — GRAM STAIN

## 2020-04-02 LAB — BLOOD GAS, ARTERIAL
Acid-Base Excess: 3.6 mmol/L — ABNORMAL HIGH (ref 0.0–2.0)
Bicarbonate: 27.2 mmol/L (ref 20.0–28.0)
FIO2: 35
O2 Saturation: 96.4 %
Patient temperature: 37
pCO2 arterial: 56.9 mmHg — ABNORMAL HIGH (ref 32.0–48.0)
pH, Arterial: 7.328 — ABNORMAL LOW (ref 7.350–7.450)
pO2, Arterial: 85.8 mmHg (ref 83.0–108.0)

## 2020-04-02 LAB — COMPREHENSIVE METABOLIC PANEL
ALT: 21 U/L (ref 0–44)
AST: 20 U/L (ref 15–41)
Albumin: 2.4 g/dL — ABNORMAL LOW (ref 3.5–5.0)
Alkaline Phosphatase: 138 U/L — ABNORMAL HIGH (ref 38–126)
Anion gap: 10 (ref 5–15)
BUN: 24 mg/dL — ABNORMAL HIGH (ref 8–23)
CO2: 26 mmol/L (ref 22–32)
Calcium: 8 mg/dL — ABNORMAL LOW (ref 8.9–10.3)
Chloride: 88 mmol/L — ABNORMAL LOW (ref 98–111)
Creatinine, Ser: 2.85 mg/dL — ABNORMAL HIGH (ref 0.61–1.24)
GFR, Estimated: 23 mL/min — ABNORMAL LOW (ref >60)
Glucose, Bld: 93 mg/dL (ref 70–99)
Potassium: 3.5 mmol/L (ref 3.5–5.1)
Sodium: 124 mmol/L — ABNORMAL LOW (ref 135–145)
Total Bilirubin: 0.9 mg/dL (ref 0.3–1.2)
Total Protein: 6.3 g/dL — ABNORMAL LOW (ref 6.5–8.1)

## 2020-04-02 LAB — CBC
HCT: 26.9 % — ABNORMAL LOW (ref 39.0–52.0)
Hemoglobin: 8 g/dL — ABNORMAL LOW (ref 13.0–17.0)
MCH: 29.6 pg (ref 26.0–34.0)
MCHC: 29.7 g/dL — ABNORMAL LOW (ref 30.0–36.0)
MCV: 99.6 fL (ref 80.0–100.0)
Platelets: 162 10*3/uL (ref 150–400)
RBC: 2.7 MIL/uL — ABNORMAL LOW (ref 4.22–5.81)
RDW: 17.3 % — ABNORMAL HIGH (ref 11.5–15.5)
WBC: 5.3 10*3/uL (ref 4.0–10.5)
nRBC: 0 % (ref 0.0–0.2)

## 2020-04-02 LAB — BODY FLUID CELL COUNT WITH DIFFERENTIAL
Lymphs, Fluid: 35 %
Monocyte-Macrophage-Serous Fluid: 24 % — ABNORMAL LOW (ref 50–90)
Neutrophil Count, Fluid: 41 % — ABNORMAL HIGH (ref 0–25)
Total Nucleated Cell Count, Fluid: 272 cu mm (ref 0–1000)

## 2020-04-02 LAB — HEPATITIS B SURFACE ANTIGEN: Hepatitis B Surface Ag: NONREACTIVE

## 2020-04-02 LAB — MRSA PCR SCREENING: MRSA by PCR: NEGATIVE

## 2020-04-02 SURGERY — COLONOSCOPY WITH PROPOFOL
Anesthesia: Monitor Anesthesia Care

## 2020-04-02 MED ORDER — POLYSACCHARIDE IRON COMPLEX 150 MG PO CAPS
150.0000 mg | ORAL_CAPSULE | Freq: Every day | ORAL | Status: DC
  Administered 2020-04-02 – 2020-04-05 (×4): 150 mg via ORAL
  Filled 2020-04-02 (×5): qty 1

## 2020-04-02 MED ORDER — OXYCODONE-ACETAMINOPHEN 5-325 MG PO TABS: 1.0000 | ORAL_TABLET | Freq: Two times a day (BID) | ORAL | Status: DC | PRN

## 2020-04-02 MED ORDER — ONDANSETRON HCL 4 MG PO TABS: 4.0000 mg | ORAL_TABLET | Freq: Four times a day (QID) | ORAL | Status: DC | PRN

## 2020-04-02 MED ORDER — HYDROCODONE-ACETAMINOPHEN 5-325 MG PO TABS: 1.0000 | ORAL_TABLET | Freq: Two times a day (BID) | ORAL | Status: DC | PRN

## 2020-04-02 MED ORDER — LACTULOSE 10 GM/15ML PO SOLN
20.0000 g | Freq: Two times a day (BID) | ORAL | Status: DC
  Administered 2020-04-02 – 2020-04-06 (×8): 20 g via ORAL
  Filled 2020-04-02 (×8): qty 30

## 2020-04-02 MED ORDER — VANCOMYCIN HCL 2000 MG/400ML IV SOLN
2000.0000 mg | Freq: Once | INTRAVENOUS | Status: AC
  Administered 2020-04-02: 2000 mg via INTRAVENOUS
  Filled 2020-04-02: qty 400

## 2020-04-02 MED ORDER — DILTIAZEM HCL ER COATED BEADS 120 MG PO CP24
120.0000 mg | ORAL_CAPSULE | Freq: Every day | ORAL | Status: DC
  Administered 2020-04-03 – 2020-04-06 (×4): 120 mg via ORAL
  Filled 2020-04-02 (×6): qty 1

## 2020-04-02 MED ORDER — THIAMINE HCL 100 MG PO TABS
100.0000 mg | ORAL_TABLET | Freq: Every morning | ORAL | Status: DC
  Administered 2020-04-03 – 2020-04-06 (×4): 100 mg via ORAL
  Filled 2020-04-02 (×5): qty 1

## 2020-04-02 MED ORDER — LACTULOSE 10 GM/15ML PO SOLN: 20.0000 g | Freq: Two times a day (BID) | ORAL | Status: DC | PRN

## 2020-04-02 MED ORDER — DARBEPOETIN ALFA 200 MCG/0.4ML IJ SOSY
200.0000 ug | PREFILLED_SYRINGE | INTRAMUSCULAR | Status: DC
  Filled 2020-04-02: qty 0.4

## 2020-04-02 MED ORDER — HYDROCORTISONE (PERIANAL) 2.5 % EX CREA: 1.0000 "application " | TOPICAL_CREAM | Freq: Two times a day (BID) | CUTANEOUS | Status: DC | PRN

## 2020-04-02 MED ORDER — POVIDONE-IODINE 10 % EX SOLN
CUTANEOUS | Status: AC
  Administered 2020-04-02: 1
  Filled 2020-04-02: qty 30

## 2020-04-02 MED ORDER — VANCOMYCIN HCL 1500 MG/300ML IV SOLN: 1500.0000 mg | INTRAVENOUS | Status: DC

## 2020-04-02 MED ORDER — SPIRONOLACTONE 25 MG PO TABS
50.0000 mg | ORAL_TABLET | Freq: Every morning | ORAL | Status: DC
  Administered 2020-04-03 – 2020-04-06 (×4): 50 mg via ORAL
  Filled 2020-04-02 (×4): qty 2
  Filled 2020-04-02: qty 1
  Filled 2020-04-02: qty 2

## 2020-04-02 MED ORDER — HEPARIN SODIUM (PORCINE) 5000 UNIT/ML IJ SOLN
5000.0000 [IU] | Freq: Three times a day (TID) | INTRAMUSCULAR | Status: DC
  Administered 2020-04-02 – 2020-04-05 (×8): 5000 [IU] via SUBCUTANEOUS
  Filled 2020-04-02 (×11): qty 1

## 2020-04-02 MED ORDER — ACETAMINOPHEN 650 MG RE SUPP: 650.0000 mg | Freq: Four times a day (QID) | RECTAL | Status: DC | PRN

## 2020-04-02 MED ORDER — MIRTAZAPINE 30 MG PO TABS
30.0000 mg | ORAL_TABLET | Freq: Every day | ORAL | Status: DC
  Administered 2020-04-02 – 2020-04-05 (×4): 30 mg via ORAL
  Filled 2020-04-02 (×4): qty 1

## 2020-04-02 MED ORDER — VENLAFAXINE HCL 37.5 MG PO TABS
75.0000 mg | ORAL_TABLET | Freq: Three times a day (TID) | ORAL | Status: DC
  Administered 2020-04-02 – 2020-04-06 (×10): 75 mg via ORAL
  Filled 2020-04-02 (×3): qty 2
  Filled 2020-04-02: qty 1
  Filled 2020-04-02 (×3): qty 2
  Filled 2020-04-02: qty 1
  Filled 2020-04-02 (×3): qty 2
  Filled 2020-04-02 (×2): qty 1
  Filled 2020-04-02 (×3): qty 2

## 2020-04-02 MED ORDER — ACETAMINOPHEN 325 MG PO TABS: 650.0000 mg | ORAL_TABLET | Freq: Four times a day (QID) | ORAL | Status: DC | PRN

## 2020-04-02 MED ORDER — CHOLESTYRAMINE 4 G PO PACK
4.0000 g | PACK | Freq: Every day | ORAL | Status: DC | PRN
  Filled 2020-04-02: qty 1

## 2020-04-02 MED ORDER — TIZANIDINE HCL 2 MG PO TABS
2.0000 mg | ORAL_TABLET | Freq: Two times a day (BID) | ORAL | Status: DC | PRN
  Administered 2020-04-02 – 2020-04-03 (×2): 2 mg via ORAL
  Filled 2020-04-02 (×2): qty 1

## 2020-04-02 MED ORDER — PANTOPRAZOLE SODIUM 40 MG PO TBEC
40.0000 mg | DELAYED_RELEASE_TABLET | Freq: Every day | ORAL | Status: DC
  Administered 2020-04-03 – 2020-04-06 (×4): 40 mg via ORAL
  Filled 2020-04-02 (×4): qty 1

## 2020-04-02 MED ORDER — ONDANSETRON HCL 4 MG/2ML IJ SOLN: 4.0000 mg | Freq: Four times a day (QID) | INTRAMUSCULAR | Status: DC | PRN

## 2020-04-02 MED ORDER — GABAPENTIN 100 MG PO CAPS
100.0000 mg | ORAL_CAPSULE | Freq: Every day | ORAL | Status: DC
  Administered 2020-04-02 – 2020-04-04 (×3): 100 mg via ORAL
  Filled 2020-04-02 (×3): qty 1

## 2020-04-02 MED ORDER — HYDROXYZINE HCL 25 MG PO TABS: 25.0000 mg | ORAL_TABLET | Freq: Three times a day (TID) | ORAL | Status: DC | PRN

## 2020-04-02 MED ORDER — TAMSULOSIN HCL 0.4 MG PO CAPS
0.4000 mg | ORAL_CAPSULE | Freq: Every evening | ORAL | Status: DC
  Administered 2020-04-02 – 2020-04-05 (×4): 0.4 mg via ORAL
  Filled 2020-04-02 (×4): qty 1

## 2020-04-02 MED ORDER — METOPROLOL SUCCINATE ER 25 MG PO TB24
25.0000 mg | ORAL_TABLET | Freq: Every day | ORAL | Status: DC
  Administered 2020-04-03 – 2020-04-06 (×4): 25 mg via ORAL
  Filled 2020-04-02 (×4): qty 1

## 2020-04-02 MED ORDER — RIFAXIMIN 550 MG PO TABS
550.0000 mg | ORAL_TABLET | Freq: Two times a day (BID) | ORAL | Status: DC
  Administered 2020-04-02 – 2020-04-06 (×8): 550 mg via ORAL
  Filled 2020-04-02 (×8): qty 1

## 2020-04-02 MED ORDER — ONDANSETRON HCL 4 MG PO TABS: 4.0000 mg | ORAL_TABLET | Freq: Three times a day (TID) | ORAL | Status: DC | PRN

## 2020-04-02 MED ORDER — FUROSEMIDE 10 MG/ML IJ SOLN
INTRAMUSCULAR | Status: AC
  Filled 2020-04-02: qty 20

## 2020-04-02 MED ORDER — PRAVASTATIN SODIUM 10 MG PO TABS
20.0000 mg | ORAL_TABLET | Freq: Every morning | ORAL | Status: DC
  Administered 2020-04-03 – 2020-04-06 (×4): 20 mg via ORAL
  Filled 2020-04-02: qty 1
  Filled 2020-04-02 (×4): qty 2

## 2020-04-02 MED ORDER — FUROSEMIDE 40 MG PO TABS
40.0000 mg | ORAL_TABLET | Freq: Two times a day (BID) | ORAL | Status: DC
  Administered 2020-04-02 – 2020-04-06 (×8): 40 mg via ORAL
  Filled 2020-04-02 (×8): qty 1

## 2020-04-02 MED ORDER — MIDODRINE HCL 5 MG PO TABS
10.0000 mg | ORAL_TABLET | Freq: Two times a day (BID) | ORAL | Status: DC
  Administered 2020-04-02 – 2020-04-06 (×8): 10 mg via ORAL
  Filled 2020-04-02 (×9): qty 2

## 2020-04-02 NOTE — Procedures (Signed)
     EMERGENT HEMODIALYSIS TREATMENT NOTE:  3 hour heparin-free HD completed via LUE AVF (16g/antegrade). Goal met: 3 liters removed without interruption in UF.  Albumin 25g given x1.  All blood was returned and hemostasis was achieved in 15 minutes.  Rockwell Alexandria, RN

## 2020-04-02 NOTE — ED Notes (Signed)
bipap in place. Pt not alert enough to take medications at this time.

## 2020-04-02 NOTE — H&P (Signed)
History and Physical    Jeffrey [name] OIN:[id number] DOB: 07-Oct-1945 DOA: 04/01/2020  PCP: Monico Blitz, MD   Patient coming from: Home.   I have personally briefly reviewed patient's old medical records in [place]  Chief Complaint: Shortness of breath and lethargy.  HPI: Jeffrey [name] is a 74 y.o. male with medical history significant of chronic A. fib, anemia, anxiety/depression, osteoarthritis, C. difficile colitis, cholelithiasis, type 2 diabetes mellitus, history of alcoholic liver cirrhosis, history of esophageal varices, GERD, hypertension, chronic respiratory failure on home oxygen at 2 LPM via [name] who missed dialysis Friday and EMS was called to his house because the patient was lethargic and short of breath.  He is currently on BiPAP and unable to provide much history.  ED Course: Initial vital signs temperature 98.6 F, pulse 82, respirations 14, blood pressure 131/59 mmHg O2 sat 87% on nasal cannula at 8 LPM.imaging findings consistent with volume overload.  He was placed on BiPAP and received emergent dialysis.  His initial blood gas showed a pH of 7.226, PCO2 64.5 and PO2 of 269 mmHg.  Urinalysis showed turbid color, proteinuria 30, large leukocyte esterase, more than 50 WBC per hpf and many bacteria.  Coronavirus and influenza PCR were negative.  Ammonia was 66.CBC showed a white count of 6.8, hemoglobin 8.4 g/dL platelets 195.  Sodium 121, potassium 4.0, chloride 87 and CO2 24 mmol/L.  Glucose 135, BUN 40 and creatinine 3.97 mg/dL.  LFTs were normal, except for Albumin 2.3 g/dL and alkaline phosphatase 161 units/L.  Review of Systems: As per HPI otherwise all other systems reviewed and are negative.  Past Medical History:  Diagnosis Date  . A-fib [facility])    when initially stating dialysis january 2021 at [facility] per spouse; never heard anything else about it  . Alcoholic cirrhosis ([place])    patient reports completing hep a and b vaccines in 2006  . Anemia    has  had 3 units of prbcs 2013  . Anxiety   . Arthritis   . Asymptomatic gallstones    Ultrasound in 2006  . C. difficile colitis   . Cholelithiasis   . Chronic kidney disease    Dialysis M/W/F/Sa  . Depression   . Diabetes ([place])   . Duodenal ulcer with hemorrhage    per patient in 2006 or 2007, records have been requested  . Dyspnea    low oxygen level -  has O2 2 L all day  . 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 GERD (gastroesophageal reflux disease)   . Heart burn   . 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 HOH (hard of hearing)   . Hypertension   . Liver cirrhosis ([place])   . Splenomegaly    Ultrasound in 2006   Past Surgical History:  Procedure Laterality Date  . AGILE CAPSULE N/A 09/20/2019   Procedure: AGILE CAPSULE;  Surgeon: Daneil Dolin, MD;  [facility];  Service: Endoscopy;  Laterality: N/A;  . APPENDECTOMY    . AV FISTULA PLACEMENT Left 08/17/2019   Procedure: ARTERIOVENOUS (AV) FISTULA CREATION VERSES ARTERIOVENOUS GRAFT;  Surgeon: [name], MD;  Location: [place];  Service: Vascular;  Laterality: Left;  . BASCILIC VEIN TRANSPOSITION Left 09/28/2019   Procedure: LEFT SECOND STAGE BASCILIC VEIN TRANSPOSITION;  Surgeon: [name], MD;  Location: [place];  Service: Vascular;  Laterality: Left;  . BIOPSY  02/03/2018   Procedure: BIOPSY;  Surgeon: Daneil Dolin, MD;  [facility];  Service: Endoscopy;;  bx of gastric polyps  . BIOPSY  09/19/2019   Procedure: BIOPSY;  Surgeon: Danie Binder, MD;  [facility];  Service: Endoscopy;;  . CATARACT EXTRACTION Right   . COLONOSCOPY  03/10/2005   Rectal polyp as described above, removed with snare. Left sided  diverticula. The remainder of the colonic mucosa appeared normal. Inflammed polyp on path.  . COLONOSCOPY  04/1999   Dr. Thea Silversmith polyps removed,  Path showed hyperplastic  . COLONOSCOPY  10/2015   Dr. Britta Mccreedy: diverticulosis, single sessile tubular adenoma 3-27mm in size  removed from descending colon.   . COLONOSCOPY WITH ESOPHAGOGASTRODUODENOSCOPY (EGD)  03/31/2012   ZOX:WRUEAVW AVMs. Colonic diverticulosis. tubular adenoma colon  . COLONOSCOPY WITH ESOPHAGOGASTRODUODENOSCOPY (EGD)  06/2019   FORSYTH: small esophageal varices without high risk stigmata, single large AVM on lesser curvature in gastric body s/p APC ablation, gastric antral and duodenal bulb polyposis. No significant source to explain transfusion dependent anemia. Colonoscopy with portal colopathy and diffuse edema, changes of Cdiff colitis on colonoscopy  . ESOPHAGEAL BANDING N/A 02/03/2018   Procedure: ESOPHAGEAL BANDING;  Surgeon: Daneil Dolin, MD;  [facility];  Service: Endoscopy;  Laterality: N/A;  . ESOPHAGOGASTRODUODENOSCOPY  01/15/2005   Three columns grade 1 to 2 esophageal varices, otherwise normal esophageal mucosa.  Esophagus was not manipulated otherwise./Nodularity of the antrum with overlying erosions, nonspecific finding. Path showed rare H.pylori  . ESOPHAGOGASTRODUODENOSCOPY  09/2008   Dr. Gaylene Brooks columns of grade 2-3 esoph varices, only one column was prominent. Portal gastropathy, multiple gastrc polyps at antrum, two were 2cm with black eschar, bulbar polyps, bulbar erosions  . ESOPHAGOGASTRODUODENOSCOPY  11/2004   Dr. Brantley Stage 3 esoph varices  . ESOPHAGOGASTRODUODENOSCOPY  03/31/2012   RMR: 4 columns(3-GR 2, 1-GR1) non-bleeding esophageal varices, portal gastropathy, small HH, early GAVE, multiple gastric polyps   . ESOPHAGOGASTRODUODENOSCOPY (EGD) WITH PROPOFOL N/A 02/03/2018   Dr. Gala Romney: Esophageal varices, 3 columns grade 1-2.  Portal hypertensive gastropathy.  Multiple gastric polyps, biopsy consistent with hyperplastic.  [name] ESOPHAGOGASTRODUODENOSCOPY (EGD) WITH PROPOFOL N/A 09/19/2019   Fields: grade I and II esophageal varcies, mild portal hypertensive gastropathy, moderate gastritis but no H. pylori, single hyperplastic gastric polyp removed,  obvious source for melena/transfusion dependent anemia not identified, may be due to friable gastric and duodenal mucosa in the setting of portal hypertension  . GASTRIC VARICES BANDING  03/31/2012   Procedure: GASTRIC VARICES BANDING;  Surgeon: Daneil Dolin, MD;  [facility];  Service: Endoscopy;  Laterality: N/A;  . GIVENS CAPSULE STUDY N/A 09/21/2019   Poor study with a lot of debris obstructing much of the view of the first approximate 3 hours out of 6 hours.  No obvious source of bleeding identified.  Sequela of bleeding and old blood seen in the form of"whisps" of blood, blood flecks mostly toward the end of the study.  . IR REMOVAL TUN CV CATH W/O FL  02/15/2020  . POLYPECTOMY  09/19/2019   Procedure: POLYPECTOMY;  Surgeon: Danie Binder, MD;  [facility];  Service: Endoscopy;;  . UMBILICAL HERNIA REPAIR  2017   exploratory laparotomy, abdominal washout   Social History  reports that he quit smoking about 34 years ago. His smoking use included cigarettes. He has a 50.00 pack-year smoking history. He has never used smokeless tobacco. He reports that he does not drink alcohol and does not use drugs.  Allergies  Allergen Reactions  . Chlorhexidine     unknown  . Tape Itching and Rash   Family History  Problem Relation Age of Onset  . Colon cancer Father 69       deceased age 28  . Breast cancer Sister        deceased  . Diabetes Sister   . Stroke Mother   . Healthy Son   . Healthy Daughter   . Lung cancer Neg Hx   . Ovarian cancer Neg Hx    Prior to Admission medications   Medication Sig Start Date End Date Taking? Authorizing Provider  acetaminophen (TYLENOL) 500 MG tablet Take 1,000 mg by mouth every 6 (six) hours as needed for mild pain.    Yes [provider]  Alcohol Swabs (ALCOHOL PREP) 70 % PADS Apply 1 each topically 3 (three) times daily. 01/31/20  Yes [provider]  Ascorbic Acid (VITAMIN C) 1000 MG tablet Take 1,000 mg by  mouth 2 (two) times daily.   Yes [provider]  cholestyramine Lucrezia Starch) 4 GM/DOSE powder Take 1 packet by mouth daily as needed for diarrhea or loose stools. 03/28/20  Yes [provider]  diltiazem (CARDIZEM CD) 120 MG 24 hr capsule Take 1 capsule (120 mg total) by mouth daily. 02/10/20 02/09/21 Yes Emokpae, Courage, MD  furosemide (LASIX) 40 MG tablet Take 40 mg by mouth 2 (two) times daily. 12/29/19  Yes [provider]  gabapentin (NEURONTIN) 100 MG capsule TAKE 1 CAPSULE BY MOUTH AT BEDTIME Patient taking differently: Take 100 mg by mouth at bedtime.  01/30/20  Yes Waynetta Sandy, MD  HYDROcodone-acetaminophen (NORCO/VICODIN) 5-325 MG tablet Take 1 tablet by mouth 2 (two) times daily as needed for moderate pain or severe pain.  01/16/20  Yes [provider]  hydrocortisone (ANUSOL-HC) 2.5 % rectal cream Place 1 application rectally 2 (two) times daily. Patient taking differently: Place 1 application rectally 2 (two) times daily as needed for hemorrhoids or anal itching.  03/27/20  Yes Erenest Rasher, PA-C  hydrOXYzine (VISTARIL) 25 MG capsule Take 25 mg by mouth 3 (three) times daily as needed for itching.  01/23/20  Yes [provider]  insulin degludec (TRESIBA FLEXTOUCH) 200 UNIT/ML FlexTouch Pen Inject 120 Units into the skin at bedtime. When blood sugars are up Patient taking differently: Inject 120 Units into the skin at bedtime.  02/20/20  Yes Barton Dubois, MD  iron polysaccharides (NIFEREX) 150 MG capsule Take 150 mg by mouth at bedtime.    Yes [provider]  lactulose (CHRONULAC) 10 GM/15ML solution Take 45 mLs (30 g total) by mouth 3 (three) times daily. Patient taking differently: Take 20 g by mouth 2 (two) times daily as needed for moderate constipation.  01/06/20  Yes Barton Dubois, MD  metoprolol succinate (TOPROL-XL) 25 MG 24 hr tablet Take 1 tablet (25 mg total) by mouth daily. Take at bedtime  on Monday  ,Wednesday and Friday due to dialaylisis Patient taking differently: Take 25 mg by mouth See admin instructions. Take 25 mg by mouth at bedtime every night and 25 mg in morning on  Sun, Tues, Thurs and Sat 01/06/20  Yes Barton Dubois, MD  milk thistle 175 MG tablet Take 175 mg by mouth 3 (three) times daily.    Yes [provider]  mirtazapine (REMERON) 30 MG tablet Take 1 tablet (30 mg total) by mouth at bedtime. 02/10/20  Yes Roxan Hockey, MD  Multiple Vitamin (MULTIVITAMIN WITH MINERALS) TABS tablet  Take 1 tablet by mouth in the morning.    Yes [provider]  nystatin (MYCOSTATIN/NYSTOP) powder Apply topically 3 (three) times daily. Into groin area and affected area 01/06/20  Yes Barton Dubois, MD  omega-3 acid ethyl esters (LOVAZA) 1 g capsule Take 2 g by mouth 2 (two) times daily.  04/07/19  Yes [provider]  omeprazole (PRILOSEC) 40 MG capsule Take 40 mg by mouth in the morning.    Yes [provider]  ondansetron (ZOFRAN) 4 MG tablet Take 4 mg by mouth every 8 (eight) hours as needed for nausea or vomiting.    Yes [provider]  oxyCODONE-acetaminophen (PERCOCET/ROXICET) 5-325 MG tablet Take 1 tablet by mouth 2 (two) times daily as needed for moderate pain.  02/27/20  Yes [provider]  pravastatin (PRAVACHOL) 20 MG tablet Take 20 mg by mouth in the morning.    Yes [provider]  Probiotic Product (PROBIOTIC DAILY PO) Take 1 tablet by mouth in the morning.    Yes [provider]  spironolactone (ALDACTONE) 50 MG tablet Take 50 mg by mouth in the morning.    Yes [provider]  tamsulosin (FLOMAX) 0.4 MG CAPS capsule Take 0.4 mg by mouth every evening.   Yes [provider]  thiamine 100 MG tablet Take 100 mg by mouth in the morning. Vitamin B-1   Yes [provider]  tiZANidine (ZANAFLEX) 2 MG tablet Take 2 mg by mouth 2 (two) times daily as needed for muscle spasms.  02/29/20  Yes  [provider]  triamcinolone cream (KENALOG) 0.1 % Apply 1 application topically 2 (two) times daily as needed (dry skin Itching).  12/30/17  Yes [provider]  venlafaxine (EFFEXOR) 75 MG tablet Take 75 mg by mouth 3 (three) times daily with meals.    Yes [provider]  XIFAXAN 550 MG TABS tablet TAKE 1 TABLET BY MOUTH TWICE DAILY Patient taking differently: Take 550 mg by mouth 2 (two) times daily.  02/29/20  Yes Mahala Menghini, PA-C  NOVOFINE 32G X 6 MM MISC Inject into the skin. 01/31/20   [provider]  vitamin B-12 (CYANOCOBALAMIN) 1000 MCG tablet Take 1,000 mcg by mouth daily.    [provider]    Physical Exam: Vitals:   04/02/20 0345 04/02/20 0400 04/02/20 0415 04/02/20 0430  BP: (!) 117/51 (!) 125/53 (!) 120/53 (!) 119/52  Pulse: 72 76 73 71  Resp: 15 16 16 16   Temp:      TempSrc:      SpO2: 100% 100% 100% 100%  Weight:      Height:        Constitutional: Looks chronically ill. Eyes: PERRL, lids and conjunctivae normal ENMT: BiPAP mask on.  Mucous membranes are moist. Posterior pharynx clear of any exudate or lesions. Neck: normal, supple, no masses, no thyromegaly Respiratory: Good air entry, on BiPAP.  No wheezing.. Normal respiratory effort. No accessory muscle use.  Cardiovascular: Regular rate and rhythm, no murmurs / rubs / gallops. No extremity edema. 2+ pedal pulses. No carotid bruits.  Abdomen: Obese, nondistended. Bowel sounds positive. Soft, no tenderness, no masses palpated. No hepatosplenomegaly.   Musculoskeletal: no clubbing / cyanosis.Good ROM, no contractures. Normal muscle tone.  Skin: no rashes, lesions, ulcers  Neurologic: Somnolent.  Grossly nonfocal. Psychiatric: Somnolent but arousable, answers simple questions.  Labs on Admission: I have personally reviewed following labs and imaging studies  CBC: Recent Labs  Lab 03/26/20 0933 03/26/20  1709 03/28/20 1430 04/01/20 1525 04/01/20 1901  WBC  3.8 3.7* 10.1 7.7 6.8  NEUTROABS 2,960  --  9.0*  --  5.9  HGB 8.2*  8.2* 8.8* 8.2* 8.6* 8.4*  HCT 25.9*  25.9* 28.9* 27.7* 29.5* 27.7*  MCV 97.9 99.7 100.0 101.7* 99.6  PLT 167 161 153 173 818    Basic Metabolic Panel: Recent Labs  Lab 03/26/20 1709 03/28/20 1209 04/01/20 1525 04/01/20 1901  NA 128* 131* 122* 121*  K 3.6 4.1 4.6 4.0  CL 93* 92* 89* 87*  CO2 24 27 18* 24  GLUCOSE 213* 122* 135* 135*  BUN 24* 24* 39* 40*  CREATININE 2.82* 2.97* 3.79* 3.97*  CALCIUM 8.4* 8.6* 8.2* 8.3*    GFR: Estimated Creatinine Clearance: 19.1 mL/min (A) (by C-G formula based on SCr of 3.97 mg/dL (H)).  Liver Function Tests: Recent Labs  Lab 03/26/20 1709 03/28/20 1209 04/01/20 1901  AST 31 23 21   ALT 31 25 24   ALKPHOS 229* 177* 161*  BILITOT 0.7 1.0 1.0  PROT 7.3 6.8 6.8  ALBUMIN 2.4* 2.3* 2.3*    Urine analysis:    Component Value Date/Time   COLORURINE YELLOW 04/01/2020 1945   APPEARANCEUR TURBID (A) 04/01/2020 1945   LABSPEC 1.011 04/01/2020 1945   PHURINE 5.0 04/01/2020 1945   GLUCOSEU NEGATIVE 04/01/2020 1945   HGBUR SMALL (A) 04/01/2020 1945   BILIRUBINUR NEGATIVE 04/01/2020 1945   KETONESUR NEGATIVE 04/01/2020 1945   PROTEINUR 30 (A) 04/01/2020 1945   NITRITE NEGATIVE 04/01/2020 1945   LEUKOCYTESUR LARGE (A) 04/01/2020 1945    Radiological Exams on Admission: CT Head Wo Contrast  Result Date: 04/01/2020 CLINICAL DATA:  Altered mental status. EXAM: CT HEAD WITHOUT CONTRAST TECHNIQUE: Contiguous axial images were obtained from the base of the skull through the vertex without intravenous contrast. COMPARISON:  Head CT 02/19/2020 FINDINGS: Brain: Stable degree of atrophy and chronic small vessel ischemia. No intracranial hemorrhage, mass effect, or midline shift. No hydrocephalus. The basilar cisterns are patent. No evidence of territorial infarct or acute ischemia. No extra-axial or intracranial fluid collection. Vascular: Atherosclerosis of skullbase vasculature  without hyperdense vessel or abnormal calcification. Skull: No fracture or focal lesion. Sinuses/Orbits: No acute findings. Minimal opacification of lower left mastoid air cells also seen on prior exam. Paranasal sinuses are clear. Bilateral cataract resection. Other: None. IMPRESSION: 1. No acute intracranial abnormality. 2. Stable atrophy and chronic small vessel ischemia. Electronically Signed   By: Keith Rake M.D.   On: 04/01/2020 22:29   CT Angio Chest PE W and/or Wo Contrast  Result Date: 04/01/2020 CLINICAL DATA:  Lethargy following completing bowel prep for colonoscopy EXAM: CT ANGIOGRAPHY CHEST CT ABDOMEN AND PELVIS WITH CONTRAST TECHNIQUE: Multidetector CT imaging of the chest was performed using the standard protocol during bolus administration of intravenous contrast. Multiplanar CT image reconstructions and MIPs were obtained to evaluate the vascular anatomy. Multidetector CT imaging of the abdomen and pelvis was performed using the standard protocol during bolus administration of intravenous contrast. CONTRAST:  13mL OMNIPAQUE IOHEXOL 350 MG/ML SOLN COMPARISON:  Chest x-ray from earlier in the same day FINDINGS: CTA CHEST FINDINGS Cardiovascular: Thoracic aorta demonstrates atherosclerotic calcifications without aneurysmal dilatation or dissection. Cardiac enlargement is noted. The pulmonary artery shows a normal branching pattern. No definitive filling defects are identified to suggest pulmonary embolism. Coronary calcifications are noted. Patent left upper arm dialysis fistula is noted. Mediastinum/Nodes: Thoracic inlet is within normal limits. No sizable hilar or mediastinal adenopathy is noted. The esophagus  is within normal limits. Lungs/Pleura: Lungs demonstrate bilateral pleural effusions left greater than right with lower lobe consolidation left greater than right. Prominent central vasculature is noted. Mild scattered edematous changes are noted in the interstitial. No sizable  parenchymal nodules are noted. Musculoskeletal: Degenerative changes of the thoracic spine are seen. No acute rib abnormality is noted. Review of the MIP images confirms the above findings. CT ABDOMEN and PELVIS FINDINGS Hepatobiliary: Nodular changes of liver are noted consistent with underlying cirrhosis. Multiple gallstones are seen without complicating factors. Pancreas: Unremarkable. No pancreatic ductal dilatation or surrounding inflammatory changes. Spleen: Spleen is enlarged consistent with the cirrhotic change and portal hypertension. Perisplenic varices are seen. No definitive esophageal or perigastric varices are noted at this time. Adrenals/Urinary Tract: Adrenal glands are within normal limits. Kidneys are well visualized bilaterally within normal enhancement pattern. Delayed images demonstrate no significant excretion of contrast consistent with the end-stage renal failure. The bladder is decompressed. Foley catheter is noted in place. Stomach/Bowel: Colon shows no obstructive or inflammatory changes. Appendix is not well visualized consistent with prior surgical history. Small bowel and stomach appear within normal limits. Vascular/Lymphatic: Atherosclerotic calcifications are noted without aneurysmal dilatation. No significant lymphadenopathy is noted. Reproductive: Prostate is unremarkable. Other: Moderate ascites is noted primarily on the left. Musculoskeletal: Degenerative changes of lumbar spine are seen. Review of the MIP images confirms the above findings. IMPRESSION: CTA of the chest: No evidence of pulmonary embolism. Bilateral pleural effusions with bilateral lower lobe consolidation left greater than right. CT of the abdomen and pelvis: Changes of cirrhosis of the liver with subsequent portal hypertension and splenomegaly. Moderate ascites. No other focal abnormality is noted. Electronically Signed   By: Inez Catalina M.D.   On: 04/01/2020 22:28   CT ABDOMEN PELVIS W CONTRAST  Result  Date: 04/01/2020 CLINICAL DATA:  Lethargy following completing bowel prep for colonoscopy EXAM: CT ANGIOGRAPHY CHEST CT ABDOMEN AND PELVIS WITH CONTRAST TECHNIQUE: Multidetector CT imaging of the chest was performed using the standard protocol during bolus administration of intravenous contrast. Multiplanar CT image reconstructions and MIPs were obtained to evaluate the vascular anatomy. Multidetector CT imaging of the abdomen and pelvis was performed using the standard protocol during bolus administration of intravenous contrast. CONTRAST:  145mL OMNIPAQUE IOHEXOL 350 MG/ML SOLN COMPARISON:  Chest x-ray from earlier in the same day FINDINGS: CTA CHEST FINDINGS Cardiovascular: Thoracic aorta demonstrates atherosclerotic calcifications without aneurysmal dilatation or dissection. Cardiac enlargement is noted. The pulmonary artery shows a normal branching pattern. No definitive filling defects are identified to suggest pulmonary embolism. Coronary calcifications are noted. Patent left upper arm dialysis fistula is noted. Mediastinum/Nodes: Thoracic inlet is within normal limits. No sizable hilar or mediastinal adenopathy is noted. The esophagus is within normal limits. Lungs/Pleura: Lungs demonstrate bilateral pleural effusions left greater than right with lower lobe consolidation left greater than right. Prominent central vasculature is noted. Mild scattered edematous changes are noted in the interstitial. No sizable parenchymal nodules are noted. Musculoskeletal: Degenerative changes of the thoracic spine are seen. No acute rib abnormality is noted. Review of the MIP images confirms the above findings. CT ABDOMEN and PELVIS FINDINGS Hepatobiliary: Nodular changes of liver are noted consistent with underlying cirrhosis. Multiple gallstones are seen without complicating factors. Pancreas: Unremarkable. No pancreatic ductal dilatation or surrounding inflammatory changes. Spleen: Spleen is enlarged consistent with the  cirrhotic change and portal hypertension. Perisplenic varices are seen. No definitive esophageal or perigastric varices are noted at this time. Adrenals/Urinary Tract: Adrenal glands are within  normal limits. Kidneys are well visualized bilaterally within normal enhancement pattern. Delayed images demonstrate no significant excretion of contrast consistent with the end-stage renal failure. The bladder is decompressed. Foley catheter is noted in place. Stomach/Bowel: Colon shows no obstructive or inflammatory changes. Appendix is not well visualized consistent with prior surgical history. Small bowel and stomach appear within normal limits. Vascular/Lymphatic: Atherosclerotic calcifications are noted without aneurysmal dilatation. No significant lymphadenopathy is noted. Reproductive: Prostate is unremarkable. Other: Moderate ascites is noted primarily on the left. Musculoskeletal: Degenerative changes of lumbar spine are seen. Review of the MIP images confirms the above findings. IMPRESSION: CTA of the chest: No evidence of pulmonary embolism. Bilateral pleural effusions with bilateral lower lobe consolidation left greater than right. CT of the abdomen and pelvis: Changes of cirrhosis of the liver with subsequent portal hypertension and splenomegaly. Moderate ascites. No other focal abnormality is noted. Electronically Signed   By: Inez Catalina M.D.   On: 04/01/2020 22:28   DG Chest Port 1 View  Result Date: 04/01/2020 CLINICAL DATA:  Hypoxia, lethargy EXAM: PORTABLE CHEST 1 VIEW COMPARISON:  02/19/2020 FINDINGS: Single frontal view of the chest demonstrates an enlarged cardiac silhouette. There is central vascular congestion, with worsening left basilar consolidation. There are bilateral pleural effusions, left greater than right. No pneumothorax. IMPRESSION: 1. Constellation of findings most consistent with congestive heart failure. Electronically Signed   By: Randa Ngo M.D.   On: 04/01/2020 19:07    EKG: Independently reviewed.  Vent. rate 82 BPM PR interval 188 ms QRS duration 98 ms QT/QTc 374/436 ms P-R-T axes 44 16 46 Normal sinus rhythm Nonspecific T wave abnormality Abnormal ECG  Assessment/Plan Principal Problem:   Volume overload With acute respiratory failure. Continue supplemental oxygen. Continue ventilation in BiPAP mode. Continue furosemide 40 mg p.o. daily. Fluid status and HD per nephrology.  Active Problems:   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[place]) Dialysis orders per nephrology.    Chronic liver disease and cirrhosis (HCC)   Hyperammonemia Continue lactulose. Hydroxyzine for pruritus as needed.    Essential hypertension, benign Continue Cardizem 120 mg p.o. daily per Continue furosemide 40 mg p.o. daily    Mixed hyperlipidemia Continue pravastatin 20 mg p.o. daily.    Type 2 diabetes mellitus (HCC) Carbohydrate modified diet. CBG monitoring with R ISS.    Chronic hyponatremia Secondary to volume overload. In the setting of liver cirrhosis, ESRD and obesity.    Class 2 obesity Lifestyle modifications. Follow-up with PCP.    Anxiety with depression Continue Effexor 75 mg p.o. 3 times daily. Hydroxyzine as needed for anxiety.    Asymptomatic bacteriuria Patient urinates rarely. No fever, urinary symptoms or leukocytosis..    DVT prophylaxis: Heparin SQ. Code Status:                   Full code. Family Communication: Disposition Plan:   Patient is from:  Home.  Anticipated DC to:  Home.  Anticipated DC date:  April 03, 2020  Anticipated DC barriers: Clinical status.  Consults called: Admission status:  Observation/stepdown.  Severity of Illness:  Reubin Milan MD [facility]  How to contact the [facility] Attending or Consulting provider [place] or covering provider during after hours [place], for this patient?   1. Check the care team in [facility] and look for a) attending/consulting [facility] provider listed and b) the [facility]  team listed 2. Log into [email or web address] and use 's universal password to access. If you do not  have the password, please contact the hospital operator. 3. Locate the [facility] provider you are looking for under [facility] and page to a number that you can be directly reached. 4. If you still have difficulty reaching the provider, please page the [facility] (Director on Call) for the Hospitalists listed on amion for assistance.  04/02/2020, 5:37 AM   This document was prepared using Dragon voice recognition software and may contain some unintended transcription errors.

## 2020-04-02 NOTE — Progress Notes (Signed)
Pharmacy Antibiotic Note  Jeffrey [name] is a 74 y.o. male admitted on 04/01/2020 with bacteremia.  Pharmacy has been consulted for Vancomycin dosing.  Plan: Vancomycin 2000mg  loading dose, then 1500mg   IV every 48 hours.  Goal trough 15-20 mcg/mL.  F/U cxs and clinical progress Monitor V/S, labs and levels as indicated  Height: 5\' 6"  (167.6 cm) Weight: 108.4 kg (238 lb 15.7 oz) IBW/kg (Calculated) : 63.8  Temp (24hrs), Avg:98.1 F (36.7 C), Min:97.7 F (36.5 C), Max:98.6 F (37 C)  Recent Labs  Lab 03/26/20 1709 03/28/20 1209 03/28/20 1430 04/01/20 1525 04/01/20 1901 04/02/20 0545  WBC 3.7*  --  10.1 7.7 6.8 5.3  CREATININE 2.82* 2.97*  --  3.79* 3.97* 2.85*  LATICACIDVEN  --   --   --   --  1.1  --     Estimated Creatinine Clearance: 26.6 mL/min (A) (by C-G formula based on SCr of 2.85 mg/dL (H)).    Allergies  Allergen Reactions  . Chlorhexidine     unknown  . Tape Itching and Rash    Antimicrobials this admission: Vancomycin 11/2 >>   Dose adjustments this admission: prn  Microbiology results: 11/2 BCx: GPC in both bottles 11/2 Peritoneal fluid: pending 11/2 MRSA PCR: negative  Thank you for allowing pharmacy to be a part of this patient's care.  Isac Sarna, BS [place], [place] Clinical Pharmacist Pager [phone] 04/02/2020 2:52 PM

## 2020-04-02 NOTE — Sedation Documentation (Signed)
PT tolerated paracentesis procedure well today at bedside in the ICU and 4.2 Liters of cloudy yellow fluid removed and labs sent for processing.

## 2020-04-02 NOTE — Procedures (Signed)
PreOperative Dx: Cirrhosis, ascites Postoperative Dx: Cirrhosis, ascites Procedure:   US guided paracentesis Radiologist:  Thornton Papas Anesthesia:  10 ml of1% lidocaine Specimen:  4.2 L of sl cloudy yellow ascitic fluid EBL:   < 1 ml Complications: None

## 2020-04-02 NOTE — Progress Notes (Addendum)
Patient admitted to the hospital earlier this morning by Dr. Olevia Bowens.  Patient seen and examined.  He is currently on BiPAP.  He is sleeping on my arrival, but wakes up to voice and is able to answer questions.  He does fall back asleep during my visit.  He continues have evidence of volume overload with ascites and 1-2+ pitting edema bilaterally.  He has crackles at bases bilaterally  Assessment/plan:   1.  Acute on chronic diastolic congestive heart failure. -Continues have evidence of volume overload -Volume status managed with dialysis  2.  Acute respiratory failure with hypercapnia -Currently on BiPAP -ABG show improving PCO2/pH -He is still somewhat somnolent only to continue on BiPAP for now until he is more awake  3.  End-stage renal disease on hemodialysis -Nephrology following -He was dialyzed overnight on 11/1 -3 hours of dialysis completed early morning 11/2 -Next dialysis planned for 11/3  4.  Alcoholic cirrhosis with ascites -Moderate cirrhosis noted on imaging -We will request paracentesis to be performed today -he is on lasix and aldactone  5. Hyponatremia.  -patient is hypervolemic -this should improve with fluid removal with dialysis  6. Acute metabolic encephalopathy -likely multifactorial due to hypoxia/hypercapnea and mildly elevated ammonia -continue on bipap -continue lactulose and xifaxan -mental status improving  Jeffrey [name]  Addendum:  Informed by staff that patient has 2+ sets of blood cultures with GPC, drawn on admission.  Started on vancomycin until further identification is available.  [employer]

## 2020-04-02 NOTE — Progress Notes (Signed)
MD notified regarding Critical lab results. Blood cultures, both aerobic bottles are growing gram+ Cocci.  Notified MD immediately after receiving call from lab. Orders to follow.

## 2020-04-02 NOTE — ED Provider Notes (Signed)
  Face-to-face evaluation   History: He presents for evaluation of altered mental status.  He did not dialyze yesterday as scheduled.  He is currently being prepped for colonoscopy, to evaluate GI bleeding.  Physical exam: Obese, obtunded, semiresponsive by incomprehensible words.  No increased work of breathing.  Abdomen distended and nontender.  No clear evidence for fluid wave.  Peripheral edema is present.  Medical screening examination/treatment/procedure(s) were conducted as a shared visit with non-physician practitioner(s) and myself.  I personally evaluated the patient during the encounter    Daleen Bo, MD 04/02/20 1233

## 2020-04-02 NOTE — Progress Notes (Signed)
Patient starting waking up more and asking for water and wife is bedside. Spoke with MD. Removed Bipap to try a quick swallow eval and see how he does with a small amount of water.  Applied 2LO2 via [place] immediately, as Bipap removed and placed on standby.  Respiratory aware.  He currently is tolerating a small amount of his diet from his dinner tray and tolerated some PO medications.  Bipap will need to be replaced when he dozes off again.  Will continue to monitor and discuss with oncoming RN.

## 2020-04-02 NOTE — Consult Note (Signed)
Reason for Consult: To manage dialysis and dialysis related needs Referring Physician: Meagan Spease is an 74 y.o. male with cirrhosis, HTN, T2 DM Atrial fib, chronic hypoxic respiratory failure on 2 L of O2 chronically-  Multiple ER visits and hospitalizations-  Most recently just last month.  Also ESRD- [facility] MWF-  Unknown how compliant he is with his HD- certainly volume overload appears to be an issue.  He presented to the ER last night with c/o generalized weakness and somnolent.  Found to have volume overload and had missed HD yesterday so HD was done overnight-  Removed 3 liters.  This AM on bipap- looks very volume overloaded but denies SOB.  He still appears ill so will not be for discharge today-   Outpatient orders as of late September the last time he was here-  [facility]: MWF Chair time: 12:15pm. 4 hrs, Revaclear 300, edw 96kg, 138Na, 2k, 2.5cal, 350/600. 15g needles- Left AVF. No heparin  Past Medical History:  Diagnosis Date  . A-fib [facility])    when initially stating dialysis january 2021 at [facility] per spouse; never heard anything else about it  . Alcoholic cirrhosis ([place])    patient reports completing hep a and b vaccines in 2006  . Anemia    has had 3 units of prbcs 2013  . Anxiety   . Arthritis   . Asymptomatic gallstones    Ultrasound in 2006  . C. difficile colitis   . Cholelithiasis   . Chronic kidney disease    Dialysis M/W/F/Sa  . Depression   . Diabetes ([place])   . Duodenal ulcer with hemorrhage    per patient in 2006 or 2007, records have been requested  . Dyspnea    low oxygen level -  has O2 2 L all day  . 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 GERD (gastroesophageal reflux disease)   . Heart burn   . 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 HOH (hard of hearing)   . Hypertension   . Liver cirrhosis ([place])   . Splenomegaly    Ultrasound in 2006    Past Surgical History:  Procedure Laterality Date  . AGILE CAPSULE N/A  09/20/2019   Procedure: AGILE CAPSULE;  Surgeon: Daneil Dolin, MD;  [facility];  Service: Endoscopy;  Laterality: N/A;  . APPENDECTOMY    . AV FISTULA PLACEMENT Left 08/17/2019   Procedure: ARTERIOVENOUS (AV) FISTULA CREATION VERSES ARTERIOVENOUS GRAFT;  Surgeon: [name], MD;  Location: [place];  Service: Vascular;  Laterality: Left;  . BASCILIC VEIN TRANSPOSITION Left 09/28/2019   Procedure: LEFT SECOND STAGE BASCILIC VEIN TRANSPOSITION;  Surgeon: [name], MD;  Location: [place];  Service: Vascular;  Laterality: Left;  . BIOPSY  02/03/2018   Procedure: BIOPSY;  Surgeon: Daneil Dolin, MD;  [facility];  Service: Endoscopy;;  bx of gastric polyps  . BIOPSY  09/19/2019   Procedure: BIOPSY;  Surgeon: Danie Binder, MD;  [facility];  Service: Endoscopy;;  . CATARACT EXTRACTION Right   . COLONOSCOPY  03/10/2005   Rectal polyp as described above, removed with snare. Left sided  diverticula. The remainder of the colonic mucosa appeared normal. Inflammed polyp on path.  . COLONOSCOPY  04/1999   Dr. Thea Silversmith polyps removed,  Path showed hyperplastic  . COLONOSCOPY  10/2015   Dr. Britta Mccreedy: diverticulosis, single sessile tubular adenoma 3-35mm  in size removed from descending colon.   . COLONOSCOPY WITH ESOPHAGOGASTRODUODENOSCOPY (EGD)  03/31/2012   IRC:VELFYBO AVMs. Colonic diverticulosis. tubular adenoma colon  . COLONOSCOPY WITH ESOPHAGOGASTRODUODENOSCOPY (EGD)  06/2019   FORSYTH: small esophageal varices without high risk stigmata, single large AVM on lesser curvature in gastric body s/p APC ablation, gastric antral and duodenal bulb polyposis. No significant source to explain transfusion dependent anemia. Colonoscopy with portal colopathy and diffuse edema, changes of Cdiff colitis on colonoscopy  . ESOPHAGEAL BANDING N/A 02/03/2018   Procedure: ESOPHAGEAL BANDING;  Surgeon: Daneil Dolin, MD;  [facility];  Service: Endoscopy;   Laterality: N/A;  . ESOPHAGOGASTRODUODENOSCOPY  01/15/2005   Three columns grade 1 to 2 esophageal varices, otherwise normal esophageal mucosa.  Esophagus was not manipulated otherwise./Nodularity of the antrum with overlying erosions, nonspecific finding. Path showed rare H.pylori  . ESOPHAGOGASTRODUODENOSCOPY  09/2008   Dr. Gaylene Brooks columns of grade 2-3 esoph varices, only one column was prominent. Portal gastropathy, multiple gastrc polyps at antrum, two were 2cm with black eschar, bulbar polyps, bulbar erosions  . ESOPHAGOGASTRODUODENOSCOPY  11/2004   Dr. Brantley Stage 3 esoph varices  . ESOPHAGOGASTRODUODENOSCOPY  03/31/2012   RMR: 4 columns(3-GR 2, 1-GR1) non-bleeding esophageal varices, portal gastropathy, small HH, early GAVE, multiple gastric polyps   . ESOPHAGOGASTRODUODENOSCOPY (EGD) WITH PROPOFOL N/A 02/03/2018   Dr. Gala Romney: Esophageal varices, 3 columns grade 1-2.  Portal hypertensive gastropathy.  Multiple gastric polyps, biopsy consistent with hyperplastic.  [name] ESOPHAGOGASTRODUODENOSCOPY (EGD) WITH PROPOFOL N/A 09/19/2019   Fields: grade I and II esophageal varcies, mild portal hypertensive gastropathy, moderate gastritis but no H. pylori, single hyperplastic gastric polyp removed, obvious source for melena/transfusion dependent anemia not identified, may be due to friable gastric and duodenal mucosa in the setting of portal hypertension  . GASTRIC VARICES BANDING  03/31/2012   Procedure: GASTRIC VARICES BANDING;  Surgeon: Daneil Dolin, MD;  [facility];  Service: Endoscopy;  Laterality: N/A;  . GIVENS CAPSULE STUDY N/A 09/21/2019   Poor study with a lot of debris obstructing much of the view of the first approximate 3 hours out of 6 hours.  No obvious source of bleeding identified.  Sequela of bleeding and old blood seen in the form of"whisps" of blood, blood flecks mostly toward the end of the study.  . IR REMOVAL TUN CV CATH W/O FL  02/15/2020  . POLYPECTOMY   09/19/2019   Procedure: POLYPECTOMY;  Surgeon: Danie Binder, MD;  [facility];  Service: Endoscopy;;  . UMBILICAL HERNIA REPAIR  2017   exploratory laparotomy, abdominal washout    Family History  Problem Relation Age of Onset  . Colon cancer Father 34       deceased age 37  . Breast cancer Sister        deceased  . Diabetes Sister   . Stroke Mother   . Healthy Son   . Healthy Daughter   . Lung cancer Neg Hx   . Ovarian cancer Neg Hx     Social History:  reports that he quit smoking about 34 years ago. His smoking use included cigarettes. He has a 50.00 pack-year smoking history. He has never used smokeless tobacco. He reports that he does not drink alcohol and does not use drugs.  Allergies:  Allergies  Allergen Reactions  . Chlorhexidine     unknown  . Tape Itching and Rash    Medications: I have reviewed the patient's current medications.   Results for  orders placed or performed during the hospital encounter of 04/01/20 (from the past 48 hour(s))  Blood gas, arterial (at [facility])     Status: Abnormal   Collection Time: 04/01/20  6:24 PM  Result Value Ref Range   FIO2 80.00    pH, Arterial 7.226 (L) 7.35 - 7.45   pCO2 arterial 64.5 (H) 32 - 48 mmHg   pO2, Arterial 269 (H) 83 - 108 mmHg   Bicarbonate 22.8 20.0 - 28.0 mmol/L   Acid-base deficit 0.9 0.0 - 2.0 mmol/L   O2 Saturation 99.7 %   Patient temperature 37.0    Allens test (pass/fail) PASS PASS    Comment: Performed at [facility], [address] [place], [place] [address]  CBG monitoring, ED     Status: Abnormal   Collection Time: 04/01/20  6:38 PM  Result Value Ref Range   Glucose-Capillary 129 (H) 70 - 99 mg/dL    Comment: Glucose reference range applies only to samples taken after fasting for at least 8 hours.  Comprehensive metabolic panel     Status: Abnormal   Collection Time: 04/01/20  7:01 PM  Result Value Ref Range   Sodium 121 (L) 135 - 145 mmol/L   Potassium 4.0 3.5 - 5.1 mmol/L    Chloride 87 (L) 98 - 111 mmol/L   CO2 24 22 - 32 mmol/L   Glucose, Bld 135 (H) 70 - 99 mg/dL    Comment: Glucose reference range applies only to samples taken after fasting for at least 8 hours.   BUN 40 (H) 8 - 23 mg/dL   Creatinine, Ser 3.97 (H) 0.61 - 1.24 mg/dL   Calcium 8.3 (L) 8.9 - 10.3 mg/dL   Total Protein 6.8 6.5 - 8.1 g/dL   Albumin 2.3 (L) 3.5 - 5.0 g/dL   AST 21 15 - 41 U/L   ALT 24 0 - 44 U/L   Alkaline Phosphatase 161 (H) 38 - 126 U/L   Total Bilirubin 1.0 0.3 - 1.2 mg/dL   GFR, Estimated 15 (L) >60 mL/min    Comment: (NOTE) Calculated using the CKD-EPI Creatinine Equation (2021)    Anion gap 10 5 - 15    Comment: Performed at [facility], [address] [place], [place] [address]  Ammonia     Status: Abnormal   Collection Time: 04/01/20  7:01 PM  Result Value Ref Range   Ammonia 66 (H) 9 - 35 umol/L    Comment: Performed at [facility], [address] [place], [place] [address]  Lactic acid, plasma     Status: None   Collection Time: 04/01/20  7:01 PM  Result Value Ref Range   Lactic Acid, Venous 1.1 0.5 - 1.9 mmol/L    Comment: Performed at [facility], [address] [place], [place] [address]  Ethanol     Status: None   Collection Time: 04/01/20  7:01 PM  Result Value Ref Range   Alcohol, Ethyl (B) <10 <10 mg/dL    Comment: (NOTE) Lowest detectable limit for serum alcohol is 10 mg/dL.  For medical purposes only. Performed at [facility], [address], [place], [place] [address]   CBC with Differential/Platelet     Status: Abnormal   Collection Time: 04/01/20  7:01 PM  Result Value Ref Range   WBC 6.8 4.0 - 10.5 K/uL   RBC 2.78 (L) 4.22 - 5.81 MIL/uL   Hemoglobin 8.4 (L) 13.0 - 17.0 g/dL   HCT 27.7 (L) 39 - 52 %  MCV 99.6 80.0 - 100.0 fL   MCH 30.2 26.0 - 34.0 pg   MCHC 30.3 30.0 - 36.0 g/dL   RDW 17.3 (H) 11.5 - 15.5 %   Platelets 195 150 - 400 K/uL   nRBC 0.0 0.0 - 0.2 %   Neutrophils Relative % 88 %   Neutro Abs 5.9 1.7 - 7.7 K/uL    Lymphocytes Relative 4 %   Lymphs Abs 0.3 (L) 0.7 - 4.0 K/uL   Monocytes Relative 5 %   Monocytes Absolute 0.3 0.1 - 1.0 K/uL   Eosinophils Relative 3 %   Eosinophils Absolute 0.2 0.0 - 0.5 K/uL   Basophils Relative 0 %   Basophils Absolute 0.0 0.0 - 0.1 K/uL   Immature Granulocytes 0 %   Abs Immature Granulocytes 0.02 0.00 - 0.07 K/uL    Comment: Performed at [facility], [address] [place], [place] [address]  Blood Cultures (routine x 2)     Status: None (Preliminary result)   Collection Time: 04/01/20  7:03 PM   Specimen: Vein; Blood  Result Value Ref Range   Specimen Description RIGHT ANTECUBITAL    Special Requests      BOTTLES DRAWN AEROBIC AND ANAEROBIC Blood Culture adequate volume Performed at [facility], [address], [place], [place] [address]    Culture PENDING    Report Status PENDING   Blood Cultures (routine x 2)     Status: None (Preliminary result)   Collection Time: 04/01/20  7:03 PM   Specimen: Vein; Blood  Result Value Ref Range   Specimen Description BLOOD RIGHT HAND    Special Requests      BOTTLES DRAWN AEROBIC AND ANAEROBIC Blood Culture adequate volume Performed at [facility], [address], [place], [place] [address]    Culture PENDING    Report Status PENDING   Urinalysis, Complete w Microscopic Urine, Catheterized     Status: Abnormal   Collection Time: 04/01/20  7:45 PM  Result Value Ref Range   Color, Urine YELLOW YELLOW   APPearance TURBID (A) CLEAR   Specific Gravity, Urine 1.011 1.005 - 1.030   pH 5.0 5.0 - 8.0   Glucose, UA NEGATIVE NEGATIVE mg/dL   Hgb urine dipstick SMALL (A) NEGATIVE   Bilirubin Urine NEGATIVE NEGATIVE   Ketones, ur NEGATIVE NEGATIVE mg/dL   Protein, ur 30 (A) NEGATIVE mg/dL   Nitrite NEGATIVE NEGATIVE   Leukocytes,Ua LARGE (A) NEGATIVE   RBC / HPF 6-10 0 - 5 RBC/hpf   WBC, UA >50 (H) 0 - 5 WBC/hpf   Bacteria, UA MANY (A) NONE SEEN   WBC Clumps PRESENT    Amorphous Crystal PRESENT     Comment:  Performed at [facility], [address] [place], [place] [address]  Resp Panel by RT PCR (RSV, Flu A&B, Covid) - Nasopharyngeal Swab     Status: None   Collection Time: 04/01/20  7:45 PM   Specimen: Nasopharyngeal Swab  Result Value Ref Range   SARS Coronavirus 2 by RT PCR NEGATIVE NEGATIVE    Comment: (NOTE) SARS-CoV-2 target nucleic acids are NOT DETECTED.  The SARS-CoV-2 RNA is generally detectable in upper respiratoy specimens during the acute phase of infection. The lowest concentration of SARS-CoV-2 viral copies this assay can detect is 131 copies/mL. A negative result does not preclude SARS-Cov-2 infection and should not be used as the sole basis for treatment or other patient management decisions. A negative result may occur with  improper specimen collection/handling, submission of  specimen other than nasopharyngeal swab, presence of viral mutation(s) within the areas targeted by this assay, and inadequate number of viral copies (<131 copies/mL). A negative result must be combined with clinical observations, patient history, and epidemiological information. The expected result is Negative.  Fact Sheet for Patients:  PinkCheek.be  Fact Sheet for Healthcare Providers:  GravelBags.it  This test is no t yet approved or cleared by the [place] FDA and  has been authorized for detection and/or diagnosis of SARS-CoV-2 by FDA under an Emergency Use Authorization (EUA). This EUA will remain  in effect (meaning this test can be used) for the duration of the COVID-19 declaration under Section 564(b)(1) of the Act, 21 U.S.C. section 360bbb-3(b)(1), unless the authorization is terminated or revoked sooner.     Influenza A by PCR NEGATIVE NEGATIVE   Influenza B by PCR NEGATIVE NEGATIVE    Comment: (NOTE) The Xpert Xpress SARS-CoV-2/FLU/RSV assay is intended as an aid in  the diagnosis of influenza from Nasopharyngeal  swab specimens and  should not be used as a sole basis for treatment. Nasal washings and  aspirates are unacceptable for Xpert Xpress SARS-CoV-2/FLU/RSV  testing.  Fact Sheet for Patients: PinkCheek.be  Fact Sheet for Healthcare Providers: GravelBags.it  This test is not yet approved or cleared by the [place] FDA and  has been authorized for detection and/or diagnosis of SARS-CoV-2 by  FDA under an Emergency Use Authorization (EUA). This EUA will remain  in effect (meaning this test can be used) for the duration of the  Covid-19 declaration under Section 564(b)(1) of the Act, 21  U.S.C. section 360bbb-3(b)(1), unless the authorization is  terminated or revoked.    Respiratory Syncytial Virus by PCR NEGATIVE NEGATIVE    Comment: (NOTE) Fact Sheet for Patients: PinkCheek.be  Fact Sheet for Healthcare Providers: GravelBags.it  This test is not yet approved or cleared by the [place] FDA and  has been authorized for detection and/or diagnosis of SARS-CoV-2 by  FDA under an Emergency Use Authorization (EUA). This EUA will remain  in effect (meaning this test can be used) for the duration of the  COVID-19 declaration under Section 564(b)(1) of the Act, 21 U.S.C.  section 360bbb-3(b)(1), unless the authorization is terminated or  revoked. Performed at [facility], [address], [place], [place] [address]   Blood gas, arterial     Status: Abnormal   Collection Time: 04/01/20 11:20 PM  Result Value Ref Range   FIO2 40.00    Delivery systems BILEVEL POSITIVE AIRWAY PRESSURE    LHR 18 resp/min   pH, Arterial 7.258 (L) 7.35 - 7.45   pCO2 arterial 62.1 (H) 32 - 48 mmHg   pO2, Arterial 100 83 - 108 mmHg   Bicarbonate 24.3 20.0 - 28.0 mmol/L   Acid-Base Excess 0.5 0.0 - 2.0 mmol/L   O2 Saturation 97.0 %   Patient temperature 37.0    Collection site RADIAL     Drawn by RF    Allens test (pass/fail) PASS PASS    Comment: Performed at [facility], [address] [place], [place] [address]  CBC     Status: Abnormal   Collection Time: 04/02/20  5:45 AM  Result Value Ref Range   WBC 5.3 4.0 - 10.5 K/uL   RBC 2.70 (L) 4.22 - 5.81 MIL/uL   Hemoglobin 8.0 (L) 13.0 - 17.0 g/dL   HCT 26.9 (L) 39 - 52 %   MCV 99.6 80.0 - 100.0 fL   MCH 29.6  26.0 - 34.0 pg   MCHC 29.7 (L) 30.0 - 36.0 g/dL   RDW 17.3 (H) 11.5 - 15.5 %   Platelets 162 150 - 400 K/uL   nRBC 0.0 0.0 - 0.2 %    Comment: Performed at [facility], [address], [place], [place] [address]  Comprehensive metabolic panel     Status: Abnormal   Collection Time: 04/02/20  5:45 AM  Result Value Ref Range   Sodium 124 (L) 135 - 145 mmol/L   Potassium 3.5 3.5 - 5.1 mmol/L   Chloride 88 (L) 98 - 111 mmol/L   CO2 26 22 - 32 mmol/L   Glucose, Bld 93 70 - 99 mg/dL    Comment: Glucose reference range applies only to samples taken after fasting for at least 8 hours.   BUN 24 (H) 8 - 23 mg/dL   Creatinine, Ser 2.85 (H) 0.61 - 1.24 mg/dL    Comment: DELTA CHECK NOTED   Calcium 8.0 (L) 8.9 - 10.3 mg/dL   Total Protein 6.3 (L) 6.5 - 8.1 g/dL   Albumin 2.4 (L) 3.5 - 5.0 g/dL   AST 20 15 - 41 U/L   ALT 21 0 - 44 U/L   Alkaline Phosphatase 138 (H) 38 - 126 U/L   Total Bilirubin 0.9 0.3 - 1.2 mg/dL   GFR, Estimated 23 (L) >60 mL/min    Comment: (NOTE) Calculated using the CKD-EPI Creatinine Equation (2021)    Anion gap 10 5 - 15    Comment: Performed at [facility], [address], [place], [place] [address]  Blood gas, arterial     Status: Abnormal   Collection Time: 04/02/20  6:27 AM  Result Value Ref Range   FIO2 35.00    pH, Arterial 7.328 (L) 7.35 - 7.45   pCO2 arterial 56.9 (H) 32 - 48 mmHg   pO2, Arterial 85.8 83 - 108 mmHg   Bicarbonate 27.2 20.0 - 28.0 mmol/L   Acid-Base Excess 3.6 (H) 0.0 - 2.0 mmol/L   O2 Saturation 96.4 %   Patient temperature 37.0    Allens test (pass/fail)  PASS PASS    Comment: Performed at [facility], [address], [place], [place] [address]    CT Head Wo Contrast  Result Date: 04/01/2020 CLINICAL DATA:  Altered mental status. EXAM: CT HEAD WITHOUT CONTRAST TECHNIQUE: Contiguous axial images were obtained from the base of the skull through the vertex without intravenous contrast. COMPARISON:  Head CT 02/19/2020 FINDINGS: Brain: Stable degree of atrophy and chronic small vessel ischemia. No intracranial hemorrhage, mass effect, or midline shift. No hydrocephalus. The basilar cisterns are patent. No evidence of territorial infarct or acute ischemia. No extra-axial or intracranial fluid collection. Vascular: Atherosclerosis of skullbase vasculature without hyperdense vessel or abnormal calcification. Skull: No fracture or focal lesion. Sinuses/Orbits: No acute findings. Minimal opacification of lower left mastoid air cells also seen on prior exam. Paranasal sinuses are clear. Bilateral cataract resection. Other: None. IMPRESSION: 1. No acute intracranial abnormality. 2. Stable atrophy and chronic small vessel ischemia. Electronically Signed   By: Keith Rake M.D.   On: 04/01/2020 22:29   CT Angio Chest PE W and/or Wo Contrast  Result Date: 04/01/2020 CLINICAL DATA:  Lethargy following completing bowel prep for colonoscopy EXAM: CT ANGIOGRAPHY CHEST CT ABDOMEN AND PELVIS WITH CONTRAST TECHNIQUE: Multidetector CT imaging of the chest was performed using the standard protocol during bolus administration of intravenous contrast. Multiplanar CT image reconstructions and MIPs were obtained to evaluate the  vascular anatomy. Multidetector CT imaging of the abdomen and pelvis was performed using the standard protocol during bolus administration of intravenous contrast. CONTRAST:  15mL OMNIPAQUE IOHEXOL 350 MG/ML SOLN COMPARISON:  Chest x-ray from earlier in the same day FINDINGS: CTA CHEST FINDINGS Cardiovascular: Thoracic aorta demonstrates atherosclerotic  calcifications without aneurysmal dilatation or dissection. Cardiac enlargement is noted. The pulmonary artery shows a normal branching pattern. No definitive filling defects are identified to suggest pulmonary embolism. Coronary calcifications are noted. Patent left upper arm dialysis fistula is noted. Mediastinum/Nodes: Thoracic inlet is within normal limits. No sizable hilar or mediastinal adenopathy is noted. The esophagus is within normal limits. Lungs/Pleura: Lungs demonstrate bilateral pleural effusions left greater than right with lower lobe consolidation left greater than right. Prominent central vasculature is noted. Mild scattered edematous changes are noted in the interstitial. No sizable parenchymal nodules are noted. Musculoskeletal: Degenerative changes of the thoracic spine are seen. No acute rib abnormality is noted. Review of the MIP images confirms the above findings. CT ABDOMEN and PELVIS FINDINGS Hepatobiliary: Nodular changes of liver are noted consistent with underlying cirrhosis. Multiple gallstones are seen without complicating factors. Pancreas: Unremarkable. No pancreatic ductal dilatation or surrounding inflammatory changes. Spleen: Spleen is enlarged consistent with the cirrhotic change and portal hypertension. Perisplenic varices are seen. No definitive esophageal or perigastric varices are noted at this time. Adrenals/Urinary Tract: Adrenal glands are within normal limits. Kidneys are well visualized bilaterally within normal enhancement pattern. Delayed images demonstrate no significant excretion of contrast consistent with the end-stage renal failure. The bladder is decompressed. Foley catheter is noted in place. Stomach/Bowel: Colon shows no obstructive or inflammatory changes. Appendix is not well visualized consistent with prior surgical history. Small bowel and stomach appear within normal limits. Vascular/Lymphatic: Atherosclerotic calcifications are noted without aneurysmal  dilatation. No significant lymphadenopathy is noted. Reproductive: Prostate is unremarkable. Other: Moderate ascites is noted primarily on the left. Musculoskeletal: Degenerative changes of lumbar spine are seen. Review of the MIP images confirms the above findings. IMPRESSION: CTA of the chest: No evidence of pulmonary embolism. Bilateral pleural effusions with bilateral lower lobe consolidation left greater than right. CT of the abdomen and pelvis: Changes of cirrhosis of the liver with subsequent portal hypertension and splenomegaly. Moderate ascites. No other focal abnormality is noted. Electronically Signed   By: Inez Catalina M.D.   On: 04/01/2020 22:28   CT ABDOMEN PELVIS W CONTRAST  Result Date: 04/01/2020 CLINICAL DATA:  Lethargy following completing bowel prep for colonoscopy EXAM: CT ANGIOGRAPHY CHEST CT ABDOMEN AND PELVIS WITH CONTRAST TECHNIQUE: Multidetector CT imaging of the chest was performed using the standard protocol during bolus administration of intravenous contrast. Multiplanar CT image reconstructions and MIPs were obtained to evaluate the vascular anatomy. Multidetector CT imaging of the abdomen and pelvis was performed using the standard protocol during bolus administration of intravenous contrast. CONTRAST:  171mL OMNIPAQUE IOHEXOL 350 MG/ML SOLN COMPARISON:  Chest x-ray from earlier in the same day FINDINGS: CTA CHEST FINDINGS Cardiovascular: Thoracic aorta demonstrates atherosclerotic calcifications without aneurysmal dilatation or dissection. Cardiac enlargement is noted. The pulmonary artery shows a normal branching pattern. No definitive filling defects are identified to suggest pulmonary embolism. Coronary calcifications are noted. Patent left upper arm dialysis fistula is noted. Mediastinum/Nodes: Thoracic inlet is within normal limits. No sizable hilar or mediastinal adenopathy is noted. The esophagus is within normal limits. Lungs/Pleura: Lungs demonstrate bilateral pleural  effusions left greater than right with lower lobe consolidation left greater than right. Prominent central vasculature is  noted. Mild scattered edematous changes are noted in the interstitial. No sizable parenchymal nodules are noted. Musculoskeletal: Degenerative changes of the thoracic spine are seen. No acute rib abnormality is noted. Review of the MIP images confirms the above findings. CT ABDOMEN and PELVIS FINDINGS Hepatobiliary: Nodular changes of liver are noted consistent with underlying cirrhosis. Multiple gallstones are seen without complicating factors. Pancreas: Unremarkable. No pancreatic ductal dilatation or surrounding inflammatory changes. Spleen: Spleen is enlarged consistent with the cirrhotic change and portal hypertension. Perisplenic varices are seen. No definitive esophageal or perigastric varices are noted at this time. Adrenals/Urinary Tract: Adrenal glands are within normal limits. Kidneys are well visualized bilaterally within normal enhancement pattern. Delayed images demonstrate no significant excretion of contrast consistent with the end-stage renal failure. The bladder is decompressed. Foley catheter is noted in place. Stomach/Bowel: Colon shows no obstructive or inflammatory changes. Appendix is not well visualized consistent with prior surgical history. Small bowel and stomach appear within normal limits. Vascular/Lymphatic: Atherosclerotic calcifications are noted without aneurysmal dilatation. No significant lymphadenopathy is noted. Reproductive: Prostate is unremarkable. Other: Moderate ascites is noted primarily on the left. Musculoskeletal: Degenerative changes of lumbar spine are seen. Review of the MIP images confirms the above findings. IMPRESSION: CTA of the chest: No evidence of pulmonary embolism. Bilateral pleural effusions with bilateral lower lobe consolidation left greater than right. CT of the abdomen and pelvis: Changes of cirrhosis of the liver with subsequent  portal hypertension and splenomegaly. Moderate ascites. No other focal abnormality is noted. Electronically Signed   By: Inez Catalina M.D.   On: 04/01/2020 22:28   DG Chest Port 1 View  Result Date: 04/01/2020 CLINICAL DATA:  Hypoxia, lethargy EXAM: PORTABLE CHEST 1 VIEW COMPARISON:  02/19/2020 FINDINGS: Single frontal view of the chest demonstrates an enlarged cardiac silhouette. There is central vascular congestion, with worsening left basilar consolidation. There are bilateral pleural effusions, left greater than right. No pneumothorax. IMPRESSION: 1. Constellation of findings most consistent with congestive heart failure. Electronically Signed   By: Randa Ngo M.D.   On: 04/01/2020 19:07    ROS: positive for weakness- otherwise not c/oanything Blood pressure 129/60, pulse 79, temperature 97.7 F (36.5 C), temperature source Axillary, resp. rate (!) 26, height 5\' 6"  (1.676 m), weight 108.4 kg, SpO2 100 %. General appearance: alert, moderately obese and looks very chronically ill-  not very mobile Resp: diminished breath sounds bilaterally Cardio: regular rate and rhythm, S1, S2 normal, no murmur, click, rub or gallop GI: distended- ascites Extremities: edema 3 plus Skin: mutiple excoriations left upper arm AVF-  with bruit  Assessment/Plan: 74 year old WM-  Very chronically ill with cirrhosis- resp failure at baseline and ESRD 1 somnolence and weakness-  Likely multifactorial from hypercarbia, hepatic encephalopathy, uremia.  On bipap- fairly aggressive HD needed but will not do today since just got off the machine at 0200 2 ESRD: normally MWF at [facility] last night-early AM on schedule.  Unclear how compliant he is with his HD vs just non compliance with fluid restriction but is very overloaded-  Will attempt to get volume off this admit- HD tomorrow on schedule 3 Hypertension/volume: very overloaded-  Aggressive UF with HD-  Use midodrine and albumin 4. Anemia of ESRD: not  helping his symptoms-  Will dose with ESA and check iron stores to replete if needed 5. 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 Cirrhosis-  Primary planning for paracentesis today-  Cont lactulose 7. Chronic pain/anxiety -  On multiple comfort agents 8. Hyponatremia-  Due to volume-  Better a little-  Cont with aggressive UF   Louis Meckel 04/02/2020, 12:06 PM

## 2020-04-03 ENCOUNTER — Inpatient Hospital Stay (HOSPITAL_COMMUNITY): Payer: Medicare Other

## 2020-04-03 DIAGNOSIS — J9622 Acute and chronic respiratory failure with hypercapnia: Secondary | ICD-10-CM

## 2020-04-03 DIAGNOSIS — J181 Lobar pneumonia, unspecified organism: Secondary | ICD-10-CM

## 2020-04-03 DIAGNOSIS — J9621 Acute and chronic respiratory failure with hypoxia: Secondary | ICD-10-CM

## 2020-04-03 DIAGNOSIS — E7220 Disorder of urea cycle metabolism, unspecified: Secondary | ICD-10-CM

## 2020-04-03 DIAGNOSIS — I10 Essential (primary) hypertension: Secondary | ICD-10-CM

## 2020-04-03 DIAGNOSIS — K7290 Hepatic failure, unspecified without coma: Secondary | ICD-10-CM | POA: Diagnosis not present

## 2020-04-03 DIAGNOSIS — R7881 Bacteremia: Secondary | ICD-10-CM

## 2020-04-03 DIAGNOSIS — K7460 Unspecified cirrhosis of liver: Secondary | ICD-10-CM | POA: Diagnosis not present

## 2020-04-03 LAB — RENAL FUNCTION PANEL
Albumin: 2 g/dL — ABNORMAL LOW (ref 3.5–5.0)
Anion gap: 11 (ref 5–15)
BUN: 33 mg/dL — ABNORMAL HIGH (ref 8–23)
CO2: 25 mmol/L (ref 22–32)
Calcium: 8 mg/dL — ABNORMAL LOW (ref 8.9–10.3)
Chloride: 88 mmol/L — ABNORMAL LOW (ref 98–111)
Creatinine, Ser: 3.53 mg/dL — ABNORMAL HIGH (ref 0.61–1.24)
GFR, Estimated: 17 mL/min — ABNORMAL LOW (ref >60)
Glucose, Bld: 76 mg/dL (ref 70–99)
Phosphorus: 4.5 mg/dL (ref 2.5–4.6)
Potassium: 3.9 mmol/L (ref 3.5–5.1)
Sodium: 124 mmol/L — ABNORMAL LOW (ref 135–145)

## 2020-04-03 LAB — HEMOGLOBIN A1C
Hgb A1c MFr Bld: 5.8 % — ABNORMAL HIGH (ref 4.8–5.6)
Mean Plasma Glucose: 119.76 mg/dL

## 2020-04-03 LAB — IRON AND TIBC
Iron: 21 ug/dL — ABNORMAL LOW (ref 45–182)
Saturation Ratios: 15 % — ABNORMAL LOW (ref 17.9–39.5)
TIBC: 137 ug/dL — ABNORMAL LOW (ref 250–450)
UIBC: 116 ug/dL

## 2020-04-03 LAB — CBC
HCT: 24.9 % — ABNORMAL LOW (ref 39.0–52.0)
Hemoglobin: 7.5 g/dL — ABNORMAL LOW (ref 13.0–17.0)
MCH: 29.4 pg (ref 26.0–34.0)
MCHC: 30.1 g/dL (ref 30.0–36.0)
MCV: 97.6 fL (ref 80.0–100.0)
Platelets: 141 10*3/uL — ABNORMAL LOW (ref 150–400)
RBC: 2.55 MIL/uL — ABNORMAL LOW (ref 4.22–5.81)
RDW: 17.3 % — ABNORMAL HIGH (ref 11.5–15.5)
WBC: 3.7 10*3/uL — ABNORMAL LOW (ref 4.0–10.5)
nRBC: 0 % (ref 0.0–0.2)

## 2020-04-03 LAB — AMMONIA: Ammonia: 60 umol/L — ABNORMAL HIGH (ref 9–35)

## 2020-04-03 LAB — PATHOLOGIST SMEAR REVIEW

## 2020-04-03 LAB — PROCALCITONIN: Procalcitonin: 2.94 ng/mL

## 2020-04-03 LAB — FERRITIN: Ferritin: 708 ng/mL — ABNORMAL HIGH (ref 24–336)

## 2020-04-03 MED ORDER — SODIUM CHLORIDE 0.9 % IV SOLN
125.0000 mg | INTRAVENOUS | Status: DC
  Administered 2020-04-03 – 2020-04-05 (×2): 125 mg via INTRAVENOUS
  Filled 2020-04-03 (×3): qty 10

## 2020-04-03 MED ORDER — SODIUM CHLORIDE 0.9 % IV SOLN
2.0000 g | INTRAVENOUS | Status: DC
  Administered 2020-04-03 – 2020-04-06 (×4): 2 g via INTRAVENOUS
  Filled 2020-04-03 (×4): qty 20

## 2020-04-03 NOTE — Progress Notes (Addendum)
Patient suffers from acute on chronic diastolic congestive heart failure, cirrhosis and end stage renal disease which impairs their ability to perform daily activities like ambulating in the home. A walker will not resolve issue with performing activities of daily living. A wheelchair will allow patient to safely perform daily activities. Patient is not able to propel themselves in the home using a standard weight wheelchair due to general weakness. Patient can self propel in the lightweight wheelchair.

## 2020-04-03 NOTE — Progress Notes (Signed)
PROGRESS NOTE  Jeffrey [name] OYD:[id number] DOB: 1945/12/04 DOA: 04/01/2020 PCP: Monico Blitz, MD  Brief History:  74 year old male with a history of ESRD (MWF), ASH Cirrhosis, diabetes mellitus type 2, atrial fibrillation, hypertension, and chronic respiratory failure on 2 L presenting with confusion, lethargy, and shortness of breath.   On EMS arrival, he was unable to stand up from bed because he was somnolent.  He was also noted to be hypoxic with EMS with oxygen Saturation in the 70-80 range.  Apparently, the patient missed his usual scheduled dialysis session on 03/29/2020.  He states that he has been intermittently missing his dialysis sessions, but he is unable to clarify exactly why. In the emergency department, the patient was placed on BiPAP secondary to his shortness of breath.  Chest x-ray showed pulmonary edema.  CTA chest was negative for PE but showed bilateral pleural effusions and bilateral lower lobe consolidations, with greater than right.  CT of the abdomen and pelvis showed moderate ascites with liver cirrhosis and portal hypertensive changes with splenomegaly.  CT of the brain was negative for acute findings.  Nephrology was consulted and the patient was initially dialyzed on 04/01/2020.  He was subsequently weaned off of BiPAP. Blood cultures obtained show Streptococcus mitis x2.  The patient was started on vancomycin empirically.  Assessment/Plan: Acute on chronic respiratory failure with hypoxia and hypercarbia -Initially on BiPAP -Now stable on 2 L nasal cannula which is his home demand -Secondary to pulmonary edema and pneumonia  Acute metabolic encephalopathy -Multifactorial including bacteremia, hepatic encephalopathy, hypercarbia, hypoxia -Mental status continues to improve, with a negative baseline  Streptococcal bacteremia -Source unclear, but suspect SBP -Start ceftriaxone -Repeat blood cultures -Echocardiogram  Lobar pneumonia -Check  procalcitonin -CTA chest shows bilateral lower lobe consolidation, left greater than right  Alcoholic liver cirrhosis with ascites -04/02/20 paracentesis--4.2 L removed -Concerned about SBP -WBC 272 from peritoneal fluid -Follow ascites culture  Hepatic encephalopathy -Continue lactulose and rifaximin -Presented with ammonia 66 -Repeat ammonia  Cellulitis right leg -continue ceftriaxone  Hyponatremia -Secondary to liver cirrhosis and fluid overload -Anticipate improvement with dialysis  Hyperlipidemia -Continue statin  Essential hypertension -Continue metoprolol succinate  Chronic atrial fibrillation -Rate controlled -Continue metoprolol succinate and Cardizem CD -Not a good candidate for anticoagulation secondary to his liver cirrhosis and varices  Class 2 Obesity -BMI 39.07 -lifestyle modification      Status is: Inpatient  Remains inpatient appropriate because:IV treatments appropriate due to intensity of illness or inability to take PO   Dispo: The patient is from: Home              Anticipated d/c is to: Home              Anticipated d/c date is: 2 days              Patient currently not medically stable for d/c        Family Communication:   No Family at bedside  Consultants:  renal  Code Status:  FULL   DVT Prophylaxis:  [place] Heparin   Procedures: As Listed in Progress Note Above  Antibiotics: vanco 11/2>> Ceftriaxone 11/3>>       Subjective: Patient denies fevers, chills, headache, chest pain, dyspnea, nausea, vomiting, diarrhea, abdominal pain, dysuria, hematuria, hematochezia, and melena.   Objective: Vitals:   04/03/20 1016 04/03/20 1031 04/03/20 1046 04/03/20 1101  BP: (!) 146/49 (!) 145/46 (!) 141/51 (!) 133/54  Pulse: 99  100 100 (!) 101  Resp: 18 20 17 20   Temp:      TempSrc:      SpO2: 94% 96% 93% 95%  Weight:      Height:        Intake/Output Summary (Last 24 hours) at 04/03/2020 1112 Last data filed at  04/03/2020 0900 Gross per 24 hour  Intake 1075.73 ml  Output 50 ml  Net 1025.73 ml   Weight change:  Exam:   General:  Pt is alert, follows commands appropriately, not in acute distress  HEENT: No icterus, No thrush, No neck mass, /AT  Cardiovascular: RRR, S1/S2, no rubs, no gallops  Respiratory: bibasilar rales. No wheeze  Abdomen: Soft/+BS, non tender, non distended, no guarding  Extremities: No edema,  No petechiae, No rashes, no synovitis   LEFT LEG       Data Reviewed: I have personally reviewed following labs and imaging studies Basic Metabolic Panel: Recent Labs  Lab 03/28/20 1209 04/01/20 1525 04/01/20 1901 04/02/20 0545 04/03/20 0317  NA 131* 122* 121* 124* 124*  K 4.1 4.6 4.0 3.5 3.9  CL 92* 89* 87* 88* 88*  CO2 27 18* 24 26 25   GLUCOSE 122* 135* 135* 93 76  BUN 24* 39* 40* 24* 33*  CREATININE 2.97* 3.79* 3.97* 2.85* 3.53*  CALCIUM 8.6* 8.2* 8.3* 8.0* 8.0*  PHOS  --   --   --   --  4.5   Liver Function Tests: Recent Labs  Lab 03/28/20 1209 04/01/20 1901 04/02/20 0545 04/03/20 0317  AST 23 21 20   --   ALT 25 24 21   --   ALKPHOS 177* 161* 138*  --   BILITOT 1.0 1.0 0.9  --   PROT 6.8 6.8 6.3*  --   ALBUMIN 2.3* 2.3* 2.4* 2.0*   No results for input(s): LIPASE, AMYLASE in the last 168 hours. Recent Labs  Lab 04/01/20 1901  AMMONIA 66*   Coagulation Profile: No results for input(s): INR, PROTIME in the last 168 hours. CBC: Recent Labs  Lab 03/28/20 1430 04/01/20 1525 04/01/20 1901 04/02/20 0545 04/03/20 0317  WBC 10.1 7.7 6.8 5.3 3.7*  NEUTROABS 9.0*  --  5.9  --   --   HGB 8.2* 8.6* 8.4* 8.0* 7.5*  HCT 27.7* 29.5* 27.7* 26.9* 24.9*  MCV 100.0 101.7* 99.6 99.6 97.6  PLT 153 173 195 162 141*   Cardiac Enzymes: No results for input(s): CKTOTAL, CKMB, CKMBINDEX, TROPONINI in the last 168 hours. BNP: Invalid input(s): POCBNP CBG: Recent Labs  Lab 04/01/20 1838  GLUCAP 129*   HbA1C: No results for input(s): HGBA1C in  the last 72 hours. Urine analysis:    Component Value Date/Time   COLORURINE YELLOW 04/01/2020 1945   APPEARANCEUR TURBID (A) 04/01/2020 1945   LABSPEC 1.011 04/01/2020 1945   PHURINE 5.0 04/01/2020 1945   GLUCOSEU NEGATIVE 04/01/2020 1945   HGBUR SMALL (A) 04/01/2020 1945   BILIRUBINUR NEGATIVE 04/01/2020 [place] NEGATIVE 04/01/2020 1945   PROTEINUR 30 (A) 04/01/2020 1945   NITRITE NEGATIVE 04/01/2020 1945   LEUKOCYTESUR LARGE (A) 04/01/2020 1945   Sepsis Labs: @LABRCNTIP (procalcitonin:4,lacticidven:4) ) Recent Results (from the past 240 hour(s))  SARS CORONAVIRUS 2 (Renn Dirocco 6-24 HRS) Nasopharyngeal Nasopharyngeal Swab     Status: None   Collection Time: 04/01/20 10:32 AM   Specimen: Nasopharyngeal Swab  Result Value Ref Range Status   SARS Coronavirus 2 NEGATIVE NEGATIVE Final    Comment: (NOTE) SARS-CoV-2 target nucleic acids are NOT DETECTED.  The SARS-CoV-2 RNA is generally detectable in upper and lower respiratory specimens during the acute phase of infection. Negative results do not preclude SARS-CoV-2 infection, do not rule out co-infections with other pathogens, and should not be used as the sole basis for treatment or other patient management decisions. Negative results must be combined with clinical observations, patient history, and epidemiological information. The expected result is Negative.  Fact Sheet for Patients: SugarRoll.be  Fact Sheet for Healthcare Providers: https://www.woods-mathews.com/  This test is not yet approved or cleared by the [place] FDA and  has been authorized for detection and/or diagnosis of SARS-CoV-2 by FDA under an Emergency Use Authorization (EUA). This EUA will remain  in effect (meaning this test can be used) for the duration of the COVID-19 declaration under Se ction 564(b)(1) of the Act, 21 U.S.C. section 360bbb-3(b)(1), unless the authorization is terminated or revoked  sooner.  Performed at [facility], [place] [address], [place], [place] [address]   Blood Cultures (routine x 2)     Status: Abnormal (Preliminary result)   Collection Time: 04/01/20  7:03 PM   Specimen: Right Antecubital; Blood  Result Value Ref Range Status   Specimen Description   Final    RIGHT ANTECUBITAL Performed at [facility], [address], [place], [place] [address]    Special Requests   Final    BOTTLES DRAWN AEROBIC AND ANAEROBIC Blood Culture adequate volume Performed at [facility], [address] [place], [place] [address]    Culture  Setup Time   Final    GRAM POSITIVE COCCI WBC PRESENT, PREDOMINANTLY PMN Gram Stain Report Called to,Read Back By and Verified With: SHELTON,A@[id number] .2.2021 BY MATTHEWS, B  AEROBIC BOTTLE CRITICAL RESULT CALLED TO, READ BACK BY AND VERIFIED WITH: A COLE RN 04/02/20 AT 1845 SK    Culture (A)  Final    STREPTOCOCCUS MITIS/ORALIS SUSCEPTIBILITIES TO FOLLOW Performed at [facility], [place] [address], [place], [place] [address]    Report Status PENDING  Incomplete  Blood Cultures (routine x 2)     Status: Abnormal (Preliminary result)   Collection Time: 04/01/20  7:03 PM   Specimen: BLOOD RIGHT HAND  Result Value Ref Range Status   Specimen Description   Final    BLOOD RIGHT HAND Performed at [facility], [address], [place], [place] [address]    Special Requests   Final    BOTTLES DRAWN AEROBIC AND ANAEROBIC Blood Culture adequate volume Performed at [facility], [address] [place], [place] [address]    Culture  Setup Time   Final    GRAM POSITIVE COCCI WBC PRESENT, PREDOMINANTLY PMN Gram Stain Report Called to,Read Back By and Verified With: SHELTON,A@1442  BY MATTHEWS, B 11.2.2021 AEROBIC BOTTLE CRITICAL VALUE NOTED.  VALUE IS CONSISTENT WITH PREVIOUSLY REPORTED AND CALLED VALUE. Performed at [facility], [place] [address] [place], [place] [address]    Culture STREPTOCOCCUS MITIS/ORALIS (A)  Final    Report Status PENDING  Incomplete  Blood Culture ID Panel (Reflexed)     Status: Abnormal   Collection Time: 04/01/20  7:03 PM  Result Value Ref Range Status   Enterococcus faecalis NOT DETECTED NOT DETECTED Final   Enterococcus Faecium NOT DETECTED NOT DETECTED Final   Listeria monocytogenes NOT DETECTED NOT DETECTED Final   Staphylococcus species NOT DETECTED NOT DETECTED Final   Staphylococcus aureus (BCID) NOT DETECTED NOT DETECTED Final   Staphylococcus epidermidis NOT DETECTED NOT DETECTED Final   Staphylococcus lugdunensis  NOT DETECTED NOT DETECTED Final   Streptococcus species DETECTED (A) NOT DETECTED Final    Comment: Not Enterococcus species, Streptococcus agalactiae, Streptococcus pyogenes, or Streptococcus pneumoniae. CRITICAL RESULT CALLED TO, READ BACK BY AND VERIFIED WITH: A COLE RN 04/02/20 AT 1845 SK    Streptococcus agalactiae NOT DETECTED NOT DETECTED Final   Streptococcus pneumoniae NOT DETECTED NOT DETECTED Final   Streptococcus pyogenes NOT DETECTED NOT DETECTED Final   A.calcoaceticus-baumannii NOT DETECTED NOT DETECTED Final   Bacteroides fragilis NOT DETECTED NOT DETECTED Final   Enterobacterales NOT DETECTED NOT DETECTED Final   Enterobacter cloacae complex NOT DETECTED NOT DETECTED Final   Escherichia coli NOT DETECTED NOT DETECTED Final   Klebsiella aerogenes NOT DETECTED NOT DETECTED Final   Klebsiella oxytoca NOT DETECTED NOT DETECTED Final   Klebsiella pneumoniae NOT DETECTED NOT DETECTED Final   Proteus species NOT DETECTED NOT DETECTED Final   Salmonella species NOT DETECTED NOT DETECTED Final   Serratia marcescens NOT DETECTED NOT DETECTED Final   Haemophilus influenzae NOT DETECTED NOT DETECTED Final   Neisseria meningitidis NOT DETECTED NOT DETECTED Final   Pseudomonas aeruginosa NOT DETECTED NOT DETECTED Final   Stenotrophomonas maltophilia NOT DETECTED NOT DETECTED Final   Candida albicans NOT DETECTED NOT DETECTED Final   Candida auris NOT  DETECTED NOT DETECTED Final   Candida glabrata NOT DETECTED NOT DETECTED Final   Candida krusei NOT DETECTED NOT DETECTED Final   Candida parapsilosis NOT DETECTED NOT DETECTED Final   Candida tropicalis NOT DETECTED NOT DETECTED Final   Cryptococcus neoformans/gattii NOT DETECTED NOT DETECTED Final    Comment: Performed at [facility], [address], [place], [place] [address]  Resp Panel by RT PCR (RSV, Flu A&B, Covid) - Nasopharyngeal Swab     Status: None   Collection Time: 04/01/20  7:45 PM   Specimen: Nasopharyngeal Swab  Result Value Ref Range Status   SARS Coronavirus 2 by RT PCR NEGATIVE NEGATIVE Final    Comment: (NOTE) SARS-CoV-2 target nucleic acids are NOT DETECTED.  The SARS-CoV-2 RNA is generally detectable in upper respiratoy specimens during the acute phase of infection. The lowest concentration of SARS-CoV-2 viral copies this assay can detect is 131 copies/mL. A negative result does not preclude SARS-Cov-2 infection and should not be used as the sole basis for treatment or other patient management decisions. A negative result may occur with  improper specimen collection/handling, submission of specimen other than nasopharyngeal swab, presence of viral mutation(s) within the areas targeted by this assay, and inadequate number of viral copies (<131 copies/mL). A negative result must be combined with clinical observations, patient history, and epidemiological information. The expected result is Negative.  Fact Sheet for Patients:  PinkCheek.be  Fact Sheet for Healthcare Providers:  GravelBags.it  This test is no t yet approved or cleared by the [place] FDA and  has been authorized for detection and/or diagnosis of SARS-CoV-2 by FDA under an Emergency Use Authorization (EUA). This EUA will remain  in effect (meaning this test can be used) for the duration of the COVID-19 declaration under  Section 564(b)(1) of the Act, 21 U.S.C. section 360bbb-3(b)(1), unless the authorization is terminated or revoked sooner.     Influenza A by PCR NEGATIVE NEGATIVE Final   Influenza B by PCR NEGATIVE NEGATIVE Final    Comment: (NOTE) The Xpert Xpress SARS-CoV-2/FLU/RSV assay is intended as an aid in  the diagnosis of influenza from Nasopharyngeal swab specimens and  should not be used as a  sole basis for treatment. Nasal washings and  aspirates are unacceptable for Xpert Xpress SARS-CoV-2/FLU/RSV  testing.  Fact Sheet for Patients: PinkCheek.be  Fact Sheet for Healthcare Providers: GravelBags.it  This test is not yet approved or cleared by the [place] FDA and  has been authorized for detection and/or diagnosis of SARS-CoV-2 by  FDA under an Emergency Use Authorization (EUA). This EUA will remain  in effect (meaning this test can be used) for the duration of the  Covid-19 declaration under Section 564(b)(1) of the Act, 21  U.S.C. section 360bbb-3(b)(1), unless the authorization is  terminated or revoked.    Respiratory Syncytial Virus by PCR NEGATIVE NEGATIVE Final    Comment: (NOTE) Fact Sheet for Patients: PinkCheek.be  Fact Sheet for Healthcare Providers: GravelBags.it  This test is not yet approved or cleared by the [place] FDA and  has been authorized for detection and/or diagnosis of SARS-CoV-2 by  FDA under an Emergency Use Authorization (EUA). This EUA will remain  in effect (meaning this test can be used) for the duration of the  COVID-19 declaration under Section 564(b)(1) of the Act, 21 U.S.C.  section 360bbb-3(b)(1), unless the authorization is terminated or  revoked. Performed at [facility], [address], [place], [place] [address]   MRSA PCR Screening     Status: None   Collection Time: 04/02/20 12:21 PM   Specimen: Nasal Mucosa;  Nasopharyngeal  Result Value Ref Range Status   MRSA by PCR NEGATIVE NEGATIVE Final    Comment:        The GeneXpert MRSA Assay (FDA approved for NASAL specimens only), is one component of a comprehensive MRSA colonization surveillance program. It is not intended to diagnose MRSA infection nor to guide or monitor treatment for MRSA infections. Performed at [facility], [address] [place], [place] [address]   Gram stain     Status: None   Collection Time: 04/02/20  1:00 PM   Specimen: Peritoneal Washings  Result Value Ref Range Status   Specimen Description PERITONEAL  Final   Special Requests NONE  Final   Gram Stain   Final    CYTOSPIN SMEAR NO ORGANISMS SEEN WBC PRESENT,BOTH PMN AND MONONUCLEAR Performed at [facility], [address], [place], [place] [address]    Report Status 04/02/2020 FINAL  Final  Culture, body fluid-bottle     Status: None (Preliminary result)   Collection Time: 04/02/20  1:00 PM   Specimen: Peritoneal Washings  Result Value Ref Range Status   Specimen Description PERITONEAL  Final   Special Requests 10cc  Final   Gram Stain   Final    GRAM POSITIVE COCCI WBC PRESENT,BOTH PMN AND MONONUCLEAR Gram Stain Report Called to,Read Back By and Verified With: DIANA ZOLNOSKI @0924  04/03/20 BY [facility]    Culture   Final    NO GROWTH < 24 HOURS Performed at [facility], [address], [place], [place] [address]    Report Status PENDING  Incomplete     Scheduled Meds: . darbepoetin (ARANESP) injection - DIALYSIS  200 mcg Intravenous Q Wed-HD  . diltiazem  120 mg Oral Daily  . furosemide  40 mg Oral BID  . gabapentin  100 mg Oral QHS  . heparin  5,000 Units Subcutaneous Q8H  . iron polysaccharides  150 mg Oral QHS  . lactulose  10 g Oral Once  . lactulose  20 g Oral BID  . metoprolol succinate  25 mg Oral Daily  . midodrine  10 mg Oral BID WC  . mirtazapine  30 mg Oral QHS  . pantoprazole  40 mg Oral Daily  . pravastatin  20 mg Oral q AM  .  rifaximin  550 mg Oral BID  . spironolactone  50 mg Oral q AM  . tamsulosin  0.4 mg Oral QPM  . thiamine  100 mg Oral q AM  . venlafaxine  75 mg Oral TID WC   Continuous Infusions: . sodium chloride    . sodium chloride    . ferric gluconate (FERRLECIT/NULECIT) IV    . [START ON 04/04/2020] vancomycin      Procedures/Studies: CT Head Wo Contrast  Result Date: 04/01/2020 CLINICAL DATA:  Altered mental status. EXAM: CT HEAD WITHOUT CONTRAST TECHNIQUE: Contiguous axial images were obtained from the base of the skull through the vertex without intravenous contrast. COMPARISON:  Head CT 02/19/2020 FINDINGS: Brain: Stable degree of atrophy and chronic small vessel ischemia. No intracranial hemorrhage, mass effect, or midline shift. No hydrocephalus. The basilar cisterns are patent. No evidence of territorial infarct or acute ischemia. No extra-axial or intracranial fluid collection. Vascular: Atherosclerosis of skullbase vasculature without hyperdense vessel or abnormal calcification. Skull: No fracture or focal lesion. Sinuses/Orbits: No acute findings. Minimal opacification of lower left mastoid air cells also seen on prior exam. Paranasal sinuses are clear. Bilateral cataract resection. Other: None. IMPRESSION: 1. No acute intracranial abnormality. 2. Stable atrophy and chronic small vessel ischemia. Electronically Signed   By: Keith Rake M.D.   On: 04/01/2020 22:29   CT Angio Chest PE W and/or Wo Contrast  Result Date: 04/01/2020 CLINICAL DATA:  Lethargy following completing bowel prep for colonoscopy EXAM: CT ANGIOGRAPHY CHEST CT ABDOMEN AND PELVIS WITH CONTRAST TECHNIQUE: Multidetector CT imaging of the chest was performed using the standard protocol during bolus administration of intravenous contrast. Multiplanar CT image reconstructions and MIPs were obtained to evaluate the vascular anatomy. Multidetector CT imaging of the abdomen and pelvis was performed using the standard protocol  during bolus administration of intravenous contrast. CONTRAST:  186mL OMNIPAQUE IOHEXOL 350 MG/ML SOLN COMPARISON:  Chest x-ray from earlier in the same day FINDINGS: CTA CHEST FINDINGS Cardiovascular: Thoracic aorta demonstrates atherosclerotic calcifications without aneurysmal dilatation or dissection. Cardiac enlargement is noted. The pulmonary artery shows a normal branching pattern. No definitive filling defects are identified to suggest pulmonary embolism. Coronary calcifications are noted. Patent left upper arm dialysis fistula is noted. Mediastinum/Nodes: Thoracic inlet is within normal limits. No sizable hilar or mediastinal adenopathy is noted. The esophagus is within normal limits. Lungs/Pleura: Lungs demonstrate bilateral pleural effusions left greater than right with lower lobe consolidation left greater than right. Prominent central vasculature is noted. Mild scattered edematous changes are noted in the interstitial. No sizable parenchymal nodules are noted. Musculoskeletal: Degenerative changes of the thoracic spine are seen. No acute rib abnormality is noted. Review of the MIP images confirms the above findings. CT ABDOMEN and PELVIS FINDINGS Hepatobiliary: Nodular changes of liver are noted consistent with underlying cirrhosis. Multiple gallstones are seen without complicating factors. Pancreas: Unremarkable. No pancreatic ductal dilatation or surrounding inflammatory changes. Spleen: Spleen is enlarged consistent with the cirrhotic change and portal hypertension. Perisplenic varices are seen. No definitive esophageal or perigastric varices are noted at this time. Adrenals/Urinary Tract: Adrenal glands are within normal limits. Kidneys are well visualized bilaterally within normal enhancement pattern. Delayed images demonstrate no significant excretion of contrast consistent with the end-stage renal failure. The bladder is decompressed. Foley catheter is noted in place.  Stomach/Bowel: Colon shows no  obstructive or inflammatory changes. Appendix is not well visualized consistent with prior surgical history. Small bowel and stomach appear within normal limits. Vascular/Lymphatic: Atherosclerotic calcifications are noted without aneurysmal dilatation. No significant lymphadenopathy is noted. Reproductive: Prostate is unremarkable. Other: Moderate ascites is noted primarily on the left. Musculoskeletal: Degenerative changes of lumbar spine are seen. Review of the MIP images confirms the above findings. IMPRESSION: CTA of the chest: No evidence of pulmonary embolism. Bilateral pleural effusions with bilateral lower lobe consolidation left greater than right. CT of the abdomen and pelvis: Changes of cirrhosis of the liver with subsequent portal hypertension and splenomegaly. Moderate ascites. No other focal abnormality is noted. Electronically Signed   By: Inez Catalina M.D.   On: 04/01/2020 22:28   CT ABDOMEN PELVIS W CONTRAST  Result Date: 04/01/2020 CLINICAL DATA:  Lethargy following completing bowel prep for colonoscopy EXAM: CT ANGIOGRAPHY CHEST CT ABDOMEN AND PELVIS WITH CONTRAST TECHNIQUE: Multidetector CT imaging of the chest was performed using the standard protocol during bolus administration of intravenous contrast. Multiplanar CT image reconstructions and MIPs were obtained to evaluate the vascular anatomy. Multidetector CT imaging of the abdomen and pelvis was performed using the standard protocol during bolus administration of intravenous contrast. CONTRAST:  133mL OMNIPAQUE IOHEXOL 350 MG/ML SOLN COMPARISON:  Chest x-ray from earlier in the same day FINDINGS: CTA CHEST FINDINGS Cardiovascular: Thoracic aorta demonstrates atherosclerotic calcifications without aneurysmal dilatation or dissection. Cardiac enlargement is noted. The pulmonary artery shows a normal branching pattern. No definitive filling defects are identified to suggest pulmonary embolism. Coronary calcifications are noted. Patent left  upper arm dialysis fistula is noted. Mediastinum/Nodes: Thoracic inlet is within normal limits. No sizable hilar or mediastinal adenopathy is noted. The esophagus is within normal limits. Lungs/Pleura: Lungs demonstrate bilateral pleural effusions left greater than right with lower lobe consolidation left greater than right. Prominent central vasculature is noted. Mild scattered edematous changes are noted in the interstitial. No sizable parenchymal nodules are noted. Musculoskeletal: Degenerative changes of the thoracic spine are seen. No acute rib abnormality is noted. Review of the MIP images confirms the above findings. CT ABDOMEN and PELVIS FINDINGS Hepatobiliary: Nodular changes of liver are noted consistent with underlying cirrhosis. Multiple gallstones are seen without complicating factors. Pancreas: Unremarkable. No pancreatic ductal dilatation or surrounding inflammatory changes. Spleen: Spleen is enlarged consistent with the cirrhotic change and portal hypertension. Perisplenic varices are seen. No definitive esophageal or perigastric varices are noted at this time. Adrenals/Urinary Tract: Adrenal glands are within normal limits. Kidneys are well visualized bilaterally within normal enhancement pattern. Delayed images demonstrate no significant excretion of contrast consistent with the end-stage renal failure. The bladder is decompressed. Foley catheter is noted in place. Stomach/Bowel: Colon shows no obstructive or inflammatory changes. Appendix is not well visualized consistent with prior surgical history. Small bowel and stomach appear within normal limits. Vascular/Lymphatic: Atherosclerotic calcifications are noted without aneurysmal dilatation. No significant lymphadenopathy is noted. Reproductive: Prostate is unremarkable. Other: Moderate ascites is noted primarily on the left. Musculoskeletal: Degenerative changes of lumbar spine are seen. Review of the MIP images confirms the above findings.  IMPRESSION: CTA of the chest: No evidence of pulmonary embolism. Bilateral pleural effusions with bilateral lower lobe consolidation left greater than right. CT of the abdomen and pelvis: Changes of cirrhosis of the liver with subsequent portal hypertension and splenomegaly. Moderate ascites. No other focal abnormality is noted. Electronically Signed   By: Inez Catalina M.D.   On: 04/01/2020 22:28  US Paracentesis  Result Date: 04/02/2020 INDICATION: Cirrhosis, ascites EXAM: ULTRASOUND GUIDED DIAGNOSTIC AND THERAPEUTIC PARACENTESIS MEDICATIONS: None COMPLICATIONS: None immediate PROCEDURE: Informed written consent was obtained from the patient after a discussion of the risks, benefits and alternatives to treatment. A timeout was performed prior to the initiation of the procedure. Initial ultrasound scanning demonstrates a large amount of ascites within the LEFT lower abdominal quadrant. The right lower abdomen was prepped and draped in the usual sterile fashion. 1% lidocaine was used for local anesthesia. Following this, a 5 [place] Yueh catheter was introduced. An ultrasound image was saved for documentation purposes. The paracentesis was performed. The catheter was removed and a dressing was applied. The patient tolerated the procedure well without immediate post procedural complication. Patient received post-procedure intravenous albumin; see nursing notes for details. FINDINGS: A total of approximately 4.2 L of slightly cloudy yellow ascitic fluid was removed. Samples were sent to the laboratory as requested by the clinical team. IMPRESSION: Successful ultrasound-guided paracentesis yielding 4.2 liters of peritoneal fluid. Electronically Signed   By: Lavonia Dana M.D.   On: 04/02/2020 13:52   DG Chest Port 1 View  Result Date: 04/01/2020 CLINICAL DATA:  Hypoxia, lethargy EXAM: PORTABLE CHEST 1 VIEW COMPARISON:  02/19/2020 FINDINGS: Single frontal view of the chest demonstrates an enlarged cardiac  silhouette. There is central vascular congestion, with worsening left basilar consolidation. There are bilateral pleural effusions, left greater than right. No pneumothorax. IMPRESSION: 1. Constellation of findings most consistent with congestive heart failure. Electronically Signed   By: Randa Ngo M.D.   On: 04/01/2020 19:07    Orson Eva, DO  [facility]  If 7PM-7AM, please contact night-coverage [email or web address] Password [email or web address] 04/03/2020, 11:12 AM   LOS: 1 day

## 2020-04-03 NOTE — Progress Notes (Signed)
[facility] Progress Note   Assessment/ Plan:   Outpatient ordersas of late September the last time he was here-  [facility] [place]:MWF Chair time: 12:15pm.4 hrs,Revaclear 300,edw96kg,138Na,2k, 2.5cal, 350/600.15g needles- Left AVF.No heparin  Assessment/Plan: 74 year old WM-  Very chronically ill with cirrhosis- resp failure at baseline and ESRD  1 somnolence and weakness-  Likely multifactorial from hypercarbia, hepatic encephalopathy, uremia, possible SBP.  Improved 2 ESRD: normally MWF at [facility] last night-early AM on schedule.  Unclear how compliant he is with his HD vs just non compliance with fluid restriction but is very overloaded-  Will attempt to get volume off this admit- HD today with aggressive UF 3 Hypertension/volume: very overloaded-  Aggressive UF with HD-  Use midodrine and albumin 4. 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 Cirrhosis-  on lactulose and rifampin, paracentesis with GPCs, on vanc 7. Chronic pain/anxiety -  On multiple comfort agents 8. Hyponatremia-  Due to volume-  Better a little-  Cont with aggressive UF  Subjective:    For HD today, had emergent HD overnight day of admission.  Paracentesis yesterday with GPCs on gram stain, on vanc now.  For HD today.     Objective:   BP (!) 146/49   Pulse 99   Temp 98.1 F (36.7 C) (Oral)   Resp 18   Ht 5\' 6"  (1.676 m)   Wt 109.8 kg   SpO2 94%   BMI 39.07 kg/m   Physical Exam: Gen: NAD, sitting up eating breakfast CVS: RRR Resp: muffled breath sounds Abd: distended Ext:2+ tight pitting edema bilaterally ACCESS: LUE AVF + T/B  Labs: BMET Recent Labs  Lab 03/28/20 1209 04/01/20 1525 04/01/20 1901 04/02/20 0545 04/03/20 0317  NA 131* 122* 121* 124* 124*  K 4.1 4.6 4.0 3.5 3.9  CL  92* 89* 87* 88* 88*  CO2 27 18* 24 26 25   GLUCOSE 122* 135* 135* 93 76  BUN 24* 39* 40* 24* 33*  CREATININE 2.97* 3.79* 3.97* 2.85* 3.53*  CALCIUM 8.6* 8.2* 8.3* 8.0* 8.0*  PHOS  --   --   --   --  4.5   CBC Recent Labs  Lab 03/28/20 1430 03/28/20 1430 04/01/20 1525 04/01/20 1901 04/02/20 0545 04/03/20 0317  WBC 10.1   < > 7.7 6.8 5.3 3.7*  NEUTROABS 9.0*  --   --  5.9  --   --   HGB 8.2*   < > 8.6* 8.4* 8.0* 7.5*  HCT 27.7*   < > 29.5* 27.7* 26.9* 24.9*  MCV 100.0   < > 101.7* 99.6 99.6 97.6  PLT 153   < > 173 195 162 141*   < > = values in this interval not displayed.      Medications:    . darbepoetin (ARANESP) injection - DIALYSIS  200 mcg Intravenous Q Wed-HD  . diltiazem  120 mg Oral Daily  . furosemide  40 mg Oral BID  . gabapentin  100 mg Oral QHS  . heparin  5,000 Units Subcutaneous Q8H  . iron polysaccharides  150 mg Oral QHS  . lactulose  10 g Oral Once  . lactulose  20 g Oral BID  . metoprolol succinate  25 mg Oral Daily  .  midodrine  10 mg Oral BID WC  . mirtazapine  30 mg Oral QHS  . pantoprazole  40 mg Oral Daily  . pravastatin  20 mg Oral q AM  . rifaximin  550 mg Oral BID  . spironolactone  50 mg Oral q AM  . tamsulosin  0.4 mg Oral QPM  . thiamine  100 mg Oral q AM  . venlafaxine  75 mg Oral TID WC     Madelon Lips, MD 04/03/2020, 10:18 AM

## 2020-04-03 NOTE — Progress Notes (Signed)
RN notified this RT that she was going to have to put patient back on [place] due to fact that patient keeps pulling off Bipap mask.  I had already put patient back on X2 myself.  Will continue to monitor patient, but do not believe that patient will allow [place] to put him back on Bipap at this time.

## 2020-04-03 NOTE — TOC Initial Note (Signed)
Transition of Care [facility]) - Initial/Assessment Note    Patient Details  Name: Jeffrey [name] MRN: [id number] Date of Birth: April 09, 1946  [facility]) CM/SW Contact:    Salome Arnt, LCSW Phone Number: 04/03/2020, 10:47 AM  Clinical Narrative:  Pt admitted due to volume overload. LCSW completed assessment due to high risk readmission score. Pt's wife reports she assists pt with ADLs. He has been on dialysis since the beginning of this year and goes to [employer] [facility] MWF 2nd shift. She indicates that most days she has to convince him to go to dialysis, but he does go and stays for entire treatment. LCSW discussed d/c plan. Wife plans on pt returning home when medically stable. She is open to home health if recommended and has used [facility] in the past. Pt's wife said he has a wheelchair, but it was borrowed and is falling apart. Referral sent to [facility] as pt's home O2 is through [facility]. Orders and note entered in chart for wheelchair. Wife is aware wheelchair will be drop shipped to home. TOC will continue to follow.                      Expected Discharge Plan: Home/Self Care Barriers to Discharge: Continued Medical Work up   Patient Goals and CMS Choice Patient states their goals for this hospitalization and ongoing recovery are:: return home      Expected Discharge Plan and Services Expected Discharge Plan: Home/Self Care In-house Referral: Clinical Social Work     Living arrangements for the past 2 months: Single Family Home                 DME Arranged: Wheelchair manual DME Agency: [facility] Date DME Agency Contacted: 04/03/20 Time DME Agency Contacted: 65 Representative spoke with at DME Agency: Barbaraann Rondo            Prior Living Arrangements/Services Living arrangements for the past 2 months: Single Family Home Lives with:: Spouse Patient language and need for interpreter reviewed:: Yes Do you feel safe going back to the place where you live?: Yes       Need for Family Participation in Patient Care: Yes (Comment) Care giver support system in place?: Yes (comment) Current home services: DME (walker, ramp, 3N1, wheelchair, home O2) Criminal Activity/Legal Involvement Pertinent to Current Situation/Hospitalization: No - Comment as needed  Activities of Daily Living Home Assistive Devices/Equipment: Wheelchair ADL Screening (condition at time of admission) Patient's cognitive ability adequate to safely complete daily activities?: No Is the patient deaf or have difficulty hearing?: Yes Does the patient have difficulty seeing, even when wearing glasses/contacts?: No Does the patient have difficulty concentrating, remembering, or making decisions?: Yes Patient able to express need for assistance with ADLs?: No Does the patient have difficulty dressing or bathing?: Yes Independently performs ADLs?: No Communication: Independent Dressing (OT): Needs assistance Is this a change from baseline?: Change from baseline, expected to last <3days Grooming: Needs assistance Is this a change from baseline?: Change from baseline, expected to last <3 days Feeding: Needs assistance Is this a change from baseline?: Change from baseline, expected to last <3 days Bathing: Dependent Is this a change from baseline?: Change from baseline, expected to last <3 days Toileting: Dependent Is this a change from baseline?: Change from baseline, expected to last <3 days In/Out Bed: Dependent Is this a change from baseline?: Change from baseline, expected to last <3 days Walks in Home: Independent with device (comment) Does the patient  have difficulty walking or climbing stairs?: Yes Weakness of Legs: Both Weakness of Arms/Hands: None  Permission Sought/Granted                  Emotional Assessment         Alcohol / Substance Use: Other (comment) (history ETOH) Psych Involvement: No (comment)  Admission diagnosis:  Volume overload [E87.70] Altered  mental status, unspecified altered mental status type [R41.82] Acute on chronic congestive heart failure, unspecified heart failure type [facility]) [I50.9] Patient Active Problem List   Diagnosis Date Noted  . Volume overload 04/02/2020  . Anxiety with depression 04/02/2020  . Asymptomatic bacteriuria 04/02/2020  . Right upper quadrant abdominal pain 02/22/2020  . Urine leukocytes increased 02/22/2020  . Fall   . Minor head injury   . Chronic diastolic HF (heart failure) ([place])   . Physical deconditioning   . Acute hyponatremia 02/19/2020  . Fever and chills 02/09/2020  . Advanced care planning/counseling discussion   . DNR (do not resuscitate)   . Alcoholic cirrhosis of liver ([place]) 02/08/2020  . Back pain 02/08/2020  . Status post thoracentesis   . Hepatic encephalopathy ([place]) 01/01/2020  . Bilateral pleural effusion 12/31/2019  . Hypoglycemia 12/31/2019  . Hyperammonemia ([place]) 12/31/2019  . Class 2 obesity 12/31/2019  . Hypoalbuminemia 12/31/2019  . Nosebleed   . Goals of care, counseling/discussion   . Palliative care by specialist   . Acute hepatic encephalopathy 12/13/2019  . Encephalopathy acute   . Hyperkalemia   . Chronic hyponatremia   . Anemia 11/16/2019  . Acute blood loss anemia 09/20/2019  . Gastrointestinal hemorrhage   . Acute on chronic anemia 09/18/2019  . Generalized weakness 09/18/2019  . Type 2 diabetes mellitus ([place]) 09/18/2019  . 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[place]) 09/18/2019  . Enteritis due to Clostridium difficile 08/31/2019  . Edema 04/11/2019  . Alcoholic cirrhosis ([place]) 80/88/1103  . Type 2 diabetes mellitus with stage 3 chronic kidney disease, with long-term current use of insulin ([place]) 10/20/2017  . Essential hypertension, benign 10/20/2017  . Morbid obesity ([place]) 10/20/2017  . Mixed hyperlipidemia 10/20/2017  . Ascites   . SBP (spontaneous bacterial peritonitis) ([place]) 01/12/2016  . Shigella dysenteriae 01/11/2016  . Diarrhea 01/10/2016  .  Chronic liver disease and cirrhosis ([place]) 09/05/2012  . Alcoholic cirrhosis of liver with ascites ([place]) 03/09/2012  . Heme positive stool 03/09/2012  . Anemia due to chronic blood loss 03/09/2012  . Pancytopenia ([place]) 03/09/2012  . FH: colon cancer 03/09/2012  . 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 [place] [address] Phone: [phone] Fax: [phone]     Social Determinants of Health (SDOH) Interventions    Readmission Risk Interventions Readmission Risk Prevention Plan 04/03/2020  Transportation Screening Complete  Medication Review ([place]) Complete  HRI or [place] Complete  SW Recovery Care/Counseling Consult Complete  [place] Not Applicable  Some recent data might be hidden

## 2020-04-04 ENCOUNTER — Inpatient Hospital Stay (HOSPITAL_COMMUNITY): Payer: Medicare Other

## 2020-04-04 DIAGNOSIS — E871 Hypo-osmolality and hyponatremia: Secondary | ICD-10-CM | POA: Diagnosis not present

## 2020-04-04 DIAGNOSIS — E669 Obesity, unspecified: Secondary | ICD-10-CM

## 2020-04-04 DIAGNOSIS — J9621 Acute and chronic respiratory failure with hypoxia: Secondary | ICD-10-CM | POA: Diagnosis not present

## 2020-04-04 DIAGNOSIS — R7881 Bacteremia: Secondary | ICD-10-CM

## 2020-04-04 DIAGNOSIS — E8779 Other fluid overload: Secondary | ICD-10-CM | POA: Diagnosis not present

## 2020-04-04 LAB — BLOOD CULTURE ID PANEL (REFLEXED) - BCID2

## 2020-04-04 LAB — ECHOCARDIOGRAM COMPLETE
AR max vel: 1.69 cm2
AV Area VTI: 1.87 cm2
AV Area mean vel: 1.68 cm2
AV Mean grad: 9.1 mmHg
AV Peak grad: 18.9 mmHg
Ao pk vel: 2.17 m/s
Area-P 1/2: 4.1 cm2
Height: 66 in
MV M vel: 5.58 m/s
MV Peak grad: 124.5 mmHg
S' Lateral: 3.7 cm
Weight: 3703.73 oz

## 2020-04-04 LAB — RENAL FUNCTION PANEL
Albumin: 1.9 g/dL — ABNORMAL LOW (ref 3.5–5.0)
Anion gap: 7 (ref 5–15)
BUN: 25 mg/dL — ABNORMAL HIGH (ref 8–23)
CO2: 26 mmol/L (ref 22–32)
Calcium: 7.9 mg/dL — ABNORMAL LOW (ref 8.9–10.3)
Chloride: 94 mmol/L — ABNORMAL LOW (ref 98–111)
Creatinine, Ser: 3.08 mg/dL — ABNORMAL HIGH (ref 0.61–1.24)
GFR, Estimated: 21 mL/min — ABNORMAL LOW (ref >60)
Glucose, Bld: 221 mg/dL — ABNORMAL HIGH (ref 70–99)
Phosphorus: 3.7 mg/dL (ref 2.5–4.6)
Potassium: 3.7 mmol/L (ref 3.5–5.1)
Sodium: 127 mmol/L — ABNORMAL LOW (ref 135–145)

## 2020-04-04 LAB — CULTURE, BLOOD (ROUTINE X 2)
Special Requests: ADEQUATE
Special Requests: ADEQUATE

## 2020-04-04 LAB — AMMONIA: Ammonia: 68 umol/L — ABNORMAL HIGH (ref 9–35)

## 2020-04-04 NOTE — Progress Notes (Signed)
*  PRELIMINARY RESULTS* Echocardiogram 2D Echocardiogram has been performed.  Leavy Cella 04/04/2020, 2:49 PM

## 2020-04-04 NOTE — Progress Notes (Signed)
Report called and given to RN on 300. Pt to be transported via [place] to room 324.

## 2020-04-04 NOTE — Progress Notes (Signed)
PHARMACY - PHYSICIAN COMMUNICATION CRITICAL VALUE ALERT - BLOOD CULTURE IDENTIFICATION (BCID)  Jeffrey [name] is an 74 y.o. male who presented to [facility] on 04/01/2020 with a chief complaint of altered mental status  Assessment:  Acute metabolic encephalopathy with streptococcal  bacteremia, hepatic encephalopathy, hypercarbia, hypoxia. Now with positive Blood cultures for K. Pneumoniae. Patients mentation is improving with current abx tx  Name of physician (or Provider) Contacted: Dr. Carles Collet  Current antibiotics: Ceftriaxone 2gm IV q24h  Changes to prescribed antibiotics recommended:  Patient is on recommended antibiotics - No changes needed  Results for orders placed or performed during the hospital encounter of 04/01/20  Blood Culture ID Panel (Reflexed) (Collected: 04/03/2020 11:44 AM)  Result Value Ref Range   Enterococcus faecalis NOT DETECTED NOT DETECTED   Enterococcus Faecium NOT DETECTED NOT DETECTED   Listeria monocytogenes NOT DETECTED NOT DETECTED   Staphylococcus species NOT DETECTED NOT DETECTED   Staphylococcus aureus (BCID) NOT DETECTED NOT DETECTED   Staphylococcus epidermidis NOT DETECTED NOT DETECTED   Staphylococcus lugdunensis NOT DETECTED NOT DETECTED   Streptococcus species NOT DETECTED NOT DETECTED   Streptococcus agalactiae NOT DETECTED NOT DETECTED   Streptococcus pneumoniae NOT DETECTED NOT DETECTED   Streptococcus pyogenes NOT DETECTED NOT DETECTED   A.calcoaceticus-baumannii NOT DETECTED NOT DETECTED   Bacteroides fragilis NOT DETECTED NOT DETECTED   Enterobacterales DETECTED (A) NOT DETECTED   Enterobacter cloacae complex NOT DETECTED NOT DETECTED   Escherichia coli NOT DETECTED NOT DETECTED   Klebsiella aerogenes NOT DETECTED NOT DETECTED   Klebsiella oxytoca NOT DETECTED NOT DETECTED   Klebsiella pneumoniae DETECTED (A) NOT DETECTED   Proteus species NOT DETECTED NOT DETECTED   Salmonella species NOT DETECTED NOT DETECTED   Serratia marcescens NOT  DETECTED NOT DETECTED   Haemophilus influenzae NOT DETECTED NOT DETECTED   Neisseria meningitidis NOT DETECTED NOT DETECTED   Pseudomonas aeruginosa NOT DETECTED NOT DETECTED   Stenotrophomonas maltophilia NOT DETECTED NOT DETECTED   Candida albicans NOT DETECTED NOT DETECTED   Candida auris NOT DETECTED NOT DETECTED   Candida glabrata NOT DETECTED NOT DETECTED   Candida krusei NOT DETECTED NOT DETECTED   Candida parapsilosis NOT DETECTED NOT DETECTED   Candida tropicalis NOT DETECTED NOT DETECTED   Cryptococcus neoformans/gattii NOT DETECTED NOT DETECTED   CTX-M ESBL NOT DETECTED NOT DETECTED   Carbapenem resistance IMP NOT DETECTED NOT DETECTED   Carbapenem resistance KPC NOT DETECTED NOT DETECTED   Carbapenem resistance NDM NOT DETECTED NOT DETECTED   Carbapenem resist OXA 48 LIKE NOT DETECTED NOT DETECTED   Carbapenem resistance VIM NOT DETECTED NOT DETECTED   Isac Sarna, BS [place], BCPS Clinical Pharmacist Pager [phone] 04/04/2020  11:25 AM

## 2020-04-04 NOTE — Progress Notes (Signed)
PROGRESS NOTE  [name] HGD:[id number] DOB: 1945-10-12 DOA: 04/01/2020 PCP: Jeffrey Blitz, MD  Brief History:  74 year old male with a history of ESRD (MWF), ASH Cirrhosis, diabetes mellitus type 2, atrial fibrillation, hypertension, and chronic respiratory failure on 2 L presenting with confusion, lethargy, and shortness of breath. On EMS arrival, he was unable to stand up from bed because he was somnolent.  He was also noted to be hypoxic with EMS with oxygen Saturation in the 70-80 range.  Apparently, the patient missed his usual scheduled dialysis session on 03/29/2020.  He states that he has been intermittently missing his dialysis sessions, but he is unable to clarify exactly why. In the emergency department, the patient was placed on BiPAP secondary to his shortness of breath.  Chest x-ray showed pulmonary edema.  CTA chest was negative for PE but showed bilateral pleural effusions and bilateral lower lobe consolidations, with greater than right.  CT of the abdomen and pelvis showed moderate ascites with liver cirrhosis and portal hypertensive changes with splenomegaly.  CT of the brain was negative for acute findings.  Nephrology was consulted and the patient was initially dialyzed on 04/01/2020.  He was subsequently weaned off of BiPAP. Blood cultures obtained show Streptococcus mitis x2.  The patient was started on vancomycin empirically.  Assessment/Plan: Acute on chronic respiratory failure with hypoxia and hypercarbia -Initially on BiPAP -Now stable on 2 L nasal cannula which is his home demand -Secondary to pulmonary edema and pneumonia  Acute metabolic encephalopathy -Multifactorial including bacteremia, hepatic encephalopathy, hypercarbia, hypoxia -Mental status continues to improve -11/4--mental status at baseline  Streptococcal and Klebsiella bacteremia -Source--SBP/intraabdominal -Continue ceftriaxone -11/3 Repeat blood cultures--Kleb  pneumo -Echocardiogram  Lobar pneumonia -Check procalcitonin--2.94 -CTA chest shows bilateral lower lobe consolidation, left greater than right, no PE  Alcoholic liver cirrhosis with ascites/SBP -04/02/20 paracentesis--4.2 L removed -ascites culture--S. mitis -WBC 272 from peritoneal fluid  Hepatic encephalopathy -Continue lactulose and rifaximin -Presented with ammonia 66 -mental status overall improved  Cellulitis right leg -continue ceftriaxone -imrproving  Hyponatremia -Secondary to liver cirrhosis and fluid overload -Anticipate improvement with dialysis  Hyperlipidemia -Continue statin  Essential hypertension -Continue metoprolol succinate  Chronic atrial fibrillation -Rate controlled -Continue metoprolol succinate and Cardizem CD -Not a good candidate for anticoagulation secondary to his liver cirrhosis and varices  Class 2 Obesity -BMI 39.07 -lifestyle modification  Indwelling Foley -patient does not want it removed -follow Jeffrey [name] to have changed each month -last changed 03/14/20      Status is: Inpatient  Remains inpatient appropriate because:IV treatments appropriate due to intensity of illness or inability to take PO   Dispo: The patient is from: Home  Anticipated [name]/c is to: Home  Anticipated [name]/c date is: 2 days  Patient currently not medically stable for [name]/c        Family Communication:   spouse updated 11/4  Consultants:  renal  Code Status:  FULL   DVT Prophylaxis:  Jeffrey [name]   Procedures: As Listed in Progress Note Above  Antibiotics: vanco 11/2>>11/3 Ceftriaxone 11/3>>        Subjective:  Patient denies fevers, chills, headache, chest pain, dyspnea, nausea, vomiting, diarrhea, abdominal pain, dysuria, hematuria, hematochezia, and melena.  Objective: Vitals:   04/04/20 0600 04/04/20 0700 04/04/20 0800 04/04/20 0900  BP: (!) 122/42  (!) 119/42 (!) 128/40 126/62  Pulse: 75 74 77 86  Resp: 18 17 17 19   Temp:  TempSrc:      SpO2: 97% 99% 98% 97%  Weight:      Height:        Intake/Output Summary (Last 24 hours) at 04/04/2020 0957 Last data filed at 04/04/2020 0700 Gross per 24 hour  Intake 3050 ml  Output 5100 ml  Net -2050 ml   Weight change:  Exam:   General:  Pt is alert, follows commands appropriately, not in acute distress  HEENT: No icterus, No thrush, No neck mass, Jeffrey [name]  Cardiovascular: RRR, S1/S2, no rubs, no gallops  Respiratory: bibasilar rales, L>R  Abdomen: Soft/+BS, non tender, non distended, no guarding  Extremities: 1+LE edema, No lymphangitis, No petechiae, No rashes, no synovitis   Data Reviewed: I have personally reviewed following labs and imaging studies Basic Metabolic Panel: Recent Labs  Lab 04/01/20 1525 04/01/20 1901 04/02/20 0545 04/03/20 0317 04/04/20 0341  NA 122* 121* 124* 124* 127*  K 4.6 4.0 3.5 3.9 3.7  CL 89* 87* 88* 88* 94*  CO2 18* 24 26 25 26   GLUCOSE 135* 135* 93 76 221*  BUN 39* 40* 24* 33* 25*  CREATININE 3.79* 3.97* 2.85* 3.53* 3.08*  CALCIUM 8.2* 8.3* 8.0* 8.0* 7.9*  PHOS  --   --   --  4.5 3.7   Liver Function Tests: Recent Labs  Lab 03/28/20 1209 04/01/20 1901 04/02/20 0545 04/03/20 0317 04/04/20 0341  AST 23 21 20   --   --   ALT 25 24 21   --   --   ALKPHOS 177* 161* 138*  --   --   BILITOT 1.0 1.0 0.9  --   --   PROT 6.8 6.8 6.3*  --   --   ALBUMIN 2.3* 2.3* 2.4* 2.0* 1.9*   No results for input(s): LIPASE, AMYLASE in the last 168 hours. Recent Labs  Lab 04/01/20 1901 04/03/20 1141 04/04/20 0341  AMMONIA 66* 60* 68*   Coagulation Profile: No results for input(s): INR, PROTIME in the last 168 hours. CBC: Recent Labs  Lab 03/28/20 1430 04/01/20 1525 04/01/20 1901 04/02/20 0545 04/03/20 0317  WBC 10.1 7.7 6.8 5.3 3.7*  NEUTROABS 9.0*  --  5.9  --   --   HGB 8.2* 8.6* 8.4* 8.0* 7.5*  HCT 27.7* 29.5* 27.7* 26.9* 24.9*   MCV 100.0 101.7* 99.6 99.6 97.6  PLT 153 173 195 162 141*   Cardiac Enzymes: No results for input(s): CKTOTAL, CKMB, CKMBINDEX, TROPONINI in the last 168 hours. BNP: Invalid input(s): POCBNP CBG: Recent Labs  Lab 04/01/20 1838  GLUCAP 129*   HbA1C: Recent Labs    04/03/20 1453  HGBA1C 5.8*   Urine analysis:    Component Value Date/Time   COLORURINE YELLOW 04/01/2020 1945   APPEARANCEUR TURBID (A) 04/01/2020 1945   LABSPEC 1.011 04/01/2020 1945   PHURINE 5.0 04/01/2020 1945   GLUCOSEU NEGATIVE 04/01/2020 1945   HGBUR SMALL (A) 04/01/2020 1945   BILIRUBINUR NEGATIVE 04/01/2020 [place] NEGATIVE 04/01/2020 1945   PROTEINUR 30 (A) 04/01/2020 1945   NITRITE NEGATIVE 04/01/2020 1945   LEUKOCYTESUR LARGE (A) 04/01/2020 1945   Sepsis Labs: @LABRCNTIP (procalcitonin:4,lacticidven:4) ) Recent Results (from the past 240 hour(s))  SARS CORONAVIRUS 2 ([place] 6-24 HRS) Nasopharyngeal Nasopharyngeal Swab     Status: None   Collection Time: 04/01/20 10:32 AM   Specimen: Nasopharyngeal Swab  Result Value Ref Range Status   SARS Coronavirus 2 NEGATIVE NEGATIVE Final    Comment: (NOTE) SARS-CoV-2 target nucleic acids are NOT DETECTED.  The  SARS-CoV-2 RNA is generally detectable in upper and lower respiratory specimens during the acute phase of infection. Negative results do not preclude SARS-CoV-2 infection, do not rule out co-infections with other pathogens, and should not be used as the sole basis for treatment or other patient management decisions. Negative results must be combined with clinical observations, patient history, and epidemiological information. The expected result is Negative.  Fact Sheet for Patients: SugarRoll.be  Fact Sheet for Healthcare Providers: https://www.woods-mathews.com/  This test is not yet approved or cleared by the [place] FDA and  has been authorized for detection and/or diagnosis of  SARS-CoV-2 by FDA under an Emergency Use Authorization (EUA). This EUA will remain  in effect (meaning this test can be used) for the duration of the COVID-19 declaration under Se ction 564(b)(1) of the Act, 21 U.S.C. section 360bbb-3(b)(1), unless the authorization is terminated or revoked sooner.  Performed at [facility], [place] [address] [place], [place] [address]   Blood Cultures (routine x 2)     Status: Abnormal (Preliminary result)   Collection Time: 04/01/20  7:03 PM   Specimen: Right Antecubital; Blood  Result Value Ref Range Status   Specimen Description   Final    RIGHT ANTECUBITAL Performed at [facility], [address], [place], [place] [address]    Special Requests   Final    BOTTLES DRAWN AEROBIC AND ANAEROBIC Blood Culture adequate volume Performed at [facility], [address], [place], [place] [address]    Culture  Setup Time   Final    GRAM POSITIVE COCCI WBC PRESENT, PREDOMINANTLY PMN Gram Stain Report Called to,Read Back By and Verified With: SHELTON,A@[id number] .2.2021 BY MATTHEWS, B  AEROBIC BOTTLE CRITICAL RESULT CALLED TO, READ BACK BY AND VERIFIED WITH: A COLE RN 04/02/20 AT 1845 SK    Culture (A)  Final    STREPTOCOCCUS MITIS/ORALIS SUSCEPTIBILITIES TO FOLLOW Performed at [facility], [place] [address], [place], [place] [address]    Report Status PENDING  Incomplete  Blood Cultures (routine x 2)     Status: Abnormal (Preliminary result)   Collection Time: 04/01/20  7:03 PM   Specimen: BLOOD RIGHT HAND  Result Value Ref Range Status   Specimen Description   Final    BLOOD RIGHT HAND Performed at [facility], [address] [place], [place] [address]    Special Requests   Final    BOTTLES DRAWN AEROBIC AND ANAEROBIC Blood Culture adequate volume Performed at [facility], [address] [place], [place] [address]    Culture  Setup Time   Final    GRAM POSITIVE COCCI WBC PRESENT, PREDOMINANTLY PMN Gram Stain Report Called to,Read  Back By and Verified With: SHELTON,A@1442  BY MATTHEWS, B 11.2.2021 AEROBIC BOTTLE CRITICAL VALUE NOTED.  VALUE IS CONSISTENT WITH PREVIOUSLY REPORTED AND CALLED VALUE. Performed at [facility], [place] [address], [place], [place] [address]    Culture STREPTOCOCCUS MITIS/ORALIS (A)  Final   Report Status PENDING  Incomplete  Blood Culture ID Panel (Reflexed)     Status: Abnormal   Collection Time: 04/01/20  7:03 PM  Result Value Ref Range Status   Enterococcus faecalis NOT DETECTED NOT DETECTED Final   Enterococcus Faecium NOT DETECTED NOT DETECTED Final   Listeria monocytogenes NOT DETECTED NOT DETECTED Final   Staphylococcus species NOT DETECTED NOT DETECTED Final   Staphylococcus aureus (BCID) NOT DETECTED NOT DETECTED Final   Staphylococcus epidermidis NOT DETECTED NOT DETECTED Final   Staphylococcus lugdunensis NOT  DETECTED NOT DETECTED Final   Streptococcus species DETECTED (A) NOT DETECTED Final    Comment: Not Enterococcus species, Streptococcus agalactiae, Streptococcus pyogenes, or Streptococcus pneumoniae. CRITICAL RESULT CALLED TO, READ BACK BY AND VERIFIED WITH: A COLE RN 04/02/20 AT 1845 SK    Streptococcus agalactiae NOT DETECTED NOT DETECTED Final   Streptococcus pneumoniae NOT DETECTED NOT DETECTED Final   Streptococcus pyogenes NOT DETECTED NOT DETECTED Final   A.calcoaceticus-baumannii NOT DETECTED NOT DETECTED Final   Bacteroides fragilis NOT DETECTED NOT DETECTED Final   Enterobacterales NOT DETECTED NOT DETECTED Final   Enterobacter cloacae complex NOT DETECTED NOT DETECTED Final   Escherichia coli NOT DETECTED NOT DETECTED Final   Klebsiella aerogenes NOT DETECTED NOT DETECTED Final   Klebsiella oxytoca NOT DETECTED NOT DETECTED Final   Klebsiella pneumoniae NOT DETECTED NOT DETECTED Final   Proteus species NOT DETECTED NOT DETECTED Final   Salmonella species NOT DETECTED NOT DETECTED Final   Serratia marcescens NOT DETECTED NOT DETECTED Final    Haemophilus influenzae NOT DETECTED NOT DETECTED Final   Neisseria meningitidis NOT DETECTED NOT DETECTED Final   Pseudomonas aeruginosa NOT DETECTED NOT DETECTED Final   Stenotrophomonas maltophilia NOT DETECTED NOT DETECTED Final   Candida albicans NOT DETECTED NOT DETECTED Final   Candida auris NOT DETECTED NOT DETECTED Final   Candida glabrata NOT DETECTED NOT DETECTED Final   Candida krusei NOT DETECTED NOT DETECTED Final   Candida parapsilosis NOT DETECTED NOT DETECTED Final   Candida tropicalis NOT DETECTED NOT DETECTED Final   Cryptococcus neoformans/gattii NOT DETECTED NOT DETECTED Final    Comment: Performed at [facility], [address], [place], [place] [address]  Resp Panel by RT PCR (RSV, Flu A&B, Covid) - Nasopharyngeal Swab     Status: None   Collection Time: 04/01/20  7:45 PM   Specimen: Nasopharyngeal Swab  Result Value Ref Range Status   SARS Coronavirus 2 by RT PCR NEGATIVE NEGATIVE Final    Comment: (NOTE) SARS-CoV-2 target nucleic acids are NOT DETECTED.  The SARS-CoV-2 RNA is generally detectable in upper respiratoy specimens during the acute phase of infection. The lowest concentration of SARS-CoV-2 viral copies this assay can detect is 131 copies/mL. A negative result does not preclude SARS-Cov-2 infection and should not be used as the sole basis for treatment or other patient management decisions. A negative result may occur with  improper specimen collection/handling, submission of specimen other than nasopharyngeal swab, presence of viral mutation(s) within the areas targeted by this assay, and inadequate number of viral copies (<131 copies/mL). A negative result must be combined with clinical observations, patient history, and epidemiological information. The expected result is Negative.  Fact Sheet for Patients:  PinkCheek.be  Fact Sheet for Healthcare Providers:  GravelBags.it  This  test is no t yet approved or cleared by the [place] FDA and  has been authorized for detection and/or diagnosis of SARS-CoV-2 by FDA under an Emergency Use Authorization (EUA). This EUA will remain  in effect (meaning this test can be used) for the duration of the COVID-19 declaration under Section 564(b)(1) of the Act, 21 U.S.C. section 360bbb-3(b)(1), unless the authorization is terminated or revoked sooner.     Influenza A by PCR NEGATIVE NEGATIVE Final   Influenza B by PCR NEGATIVE NEGATIVE Final    Comment: (NOTE) The Xpert Xpress SARS-CoV-2/FLU/RSV assay is intended as an aid in  the diagnosis of influenza from Nasopharyngeal swab specimens and  should not be used as a sole  basis for treatment. Nasal washings and  aspirates are unacceptable for Xpert Xpress SARS-CoV-2/FLU/RSV  testing.  Fact Sheet for Patients: PinkCheek.be  Fact Sheet for Healthcare Providers: GravelBags.it  This test is not yet approved or cleared by the [place] FDA and  has been authorized for detection and/or diagnosis of SARS-CoV-2 by  FDA under an Emergency Use Authorization (EUA). This EUA will remain  in effect (meaning this test can be used) for the duration of the  Covid-19 declaration under Section 564(b)(1) of the Act, 21  U.S.C. section 360bbb-3(b)(1), unless the authorization is  terminated or revoked.    Respiratory Syncytial Virus by PCR NEGATIVE NEGATIVE Final    Comment: (NOTE) Fact Sheet for Patients: PinkCheek.be  Fact Sheet for Healthcare Providers: GravelBags.it  This test is not yet approved or cleared by the [place] FDA and  has been authorized for detection and/or diagnosis of SARS-CoV-2 by  FDA under an Emergency Use Authorization (EUA). This EUA will remain  in effect (meaning this test can be used) for the duration of the  COVID-19 declaration  under Section 564(b)(1) of the Act, 21 U.S.C.  section 360bbb-3(b)(1), unless the authorization is terminated or  revoked. Performed at [facility], [address], [place], [place] [address]   MRSA PCR Screening     Status: None   Collection Time: 04/02/20 12:21 PM   Specimen: Nasal Mucosa; Nasopharyngeal  Result Value Ref Range Status   MRSA by PCR NEGATIVE NEGATIVE Final    Comment:        The GeneXpert MRSA Assay (FDA approved for NASAL specimens only), is one component of a comprehensive MRSA colonization surveillance program. It is not intended to diagnose MRSA infection nor to guide or monitor treatment for MRSA infections. Performed at [facility], [address] [place], [place] [address]   Gram stain     Status: None   Collection Time: 04/02/20  1:00 PM   Specimen: Peritoneal Washings  Result Value Ref Range Status   Specimen Description PERITONEAL  Final   Special Requests NONE  Final   Gram Stain   Final    CYTOSPIN SMEAR NO ORGANISMS SEEN WBC PRESENT,BOTH PMN AND MONONUCLEAR Performed at [facility], [address], [place], [place] [address]    Report Status 04/02/2020 FINAL  Final  Culture, body fluid-bottle     Status: None (Preliminary result)   Collection Time: 04/02/20  1:00 PM   Specimen: Peritoneal Washings  Result Value Ref Range Status   Specimen Description   Final    PERITONEAL Performed at [facility], [address], [place], [place] [address]    Special Requests   Final    10cc Performed at [facility], [address], [place], [place] [address]    Gram Stain   Final    GRAM POSITIVE COCCI WBC PRESENT,BOTH PMN AND MONONUCLEAR Gram Stain Report Called to,Read Back By and Verified With: DIANA ZOLNOSKI @0924  04/03/20 BY [facility] AEROBIC BOTTLE ONLY    Culture   Final    CULTURE REINCUBATED FOR BETTER GROWTH Performed at [facility], [place] [address] [place], [place] [address]    Report Status PENDING  Incomplete  Culture, blood  (Routine X 2) w Reflex to ID Panel     Status: None (Preliminary result)   Collection Time: 04/03/20 11:44 AM   Specimen: BLOOD  Result Value Ref Range Status   Specimen Description   Final    BLOOD DIALYSIS Performed at [facility], [address], [place], [place] [address]    Special Requests   Final    BOTTLES DRAWN AEROBIC AND ANAEROBIC Blood Culture adequate volume Performed at [facility], [address], [place], [place] [address]    Culture  Setup Time   Final    IN BOTH AEROBIC AND ANAEROBIC BOTTLES GRAM NEGATIVE RODS Gram Stain Report Called to,Read Back By and Verified With: R WAGONER,RN@0241  04/04/20 MKELLY Organism ID to follow Performed at [facility], [place] [address], [place], [place] [address]    Culture GRAM NEGATIVE RODS  Final   Report Status PENDING  Incomplete  Culture, blood (Routine X 2) w Reflex to ID Panel     Status: None (Preliminary result)   Collection Time: 04/03/20  2:53 PM   Specimen: BLOOD  Result Value Ref Range Status   Specimen Description BLOOD RIGHT HAND  Final   Special Requests   Final    BOTTLES DRAWN AEROBIC AND ANAEROBIC Blood Culture adequate volume Performed at [facility], [address], [place], [place] [address]    Culture PENDING  Incomplete   Report Status PENDING  Incomplete     Scheduled Meds: . darbepoetin (ARANESP) injection - DIALYSIS  200 mcg Intravenous Q Wed-HD  . diltiazem  120 mg Oral Daily  . furosemide  40 mg Oral BID  . gabapentin  100 mg Oral QHS  . [name]  5,000 Units Subcutaneous Q8H  . iron polysaccharides  150 mg Oral QHS  . lactulose  10 g Oral Once  . lactulose  20 g Oral BID  . metoprolol succinate  25 mg Oral Daily  . midodrine  10 mg Oral BID WC  . mirtazapine  30 mg Oral QHS  . pantoprazole  40 mg Oral Daily  . pravastatin  20 mg Oral q AM  . rifaximin  550 mg Oral BID  . spironolactone  50 mg Oral q AM  . tamsulosin  0.4 mg Oral QPM  . thiamine  100 mg Oral q AM  . venlafaxine  75  mg Oral TID WC   Continuous Infusions: . sodium chloride    . sodium chloride    . cefTRIAXone (ROCEPHIN)  IV 2 g (04/03/20 1240)  . ferric gluconate (FERRLECIT/NULECIT) IV 125 mg (04/03/20 1239)    Procedures/Studies: CT Head Wo Contrast  Result Date: 04/01/2020 CLINICAL DATA:  Altered mental status. EXAM: CT HEAD WITHOUT CONTRAST TECHNIQUE: Contiguous axial images were obtained from the base of the skull through the vertex without intravenous contrast. COMPARISON:  Head CT 02/19/2020 FINDINGS: Brain: Stable degree of atrophy and chronic small vessel ischemia. No intracranial hemorrhage, mass effect, or midline shift. No hydrocephalus. The basilar cisterns are patent. No evidence of territorial infarct or acute ischemia. No extra-axial or intracranial fluid collection. Vascular: Atherosclerosis of skullbase vasculature without hyperdense vessel or abnormal calcification. Skull: No fracture or focal lesion. Sinuses/Orbits: No acute findings. Minimal opacification of lower left mastoid air cells also seen on prior exam. Paranasal sinuses are clear. Bilateral cataract resection. Other: None. IMPRESSION: 1. No acute intracranial abnormality. 2. Stable atrophy and chronic small vessel ischemia. Electronically Signed   By: Keith Rake M.[name].   On: 04/01/2020 22:29   CT Angio Chest PE W and/or Wo Contrast  Result Date: 04/01/2020 CLINICAL DATA:  Lethargy following completing bowel prep for colonoscopy EXAM: CT ANGIOGRAPHY CHEST CT ABDOMEN AND PELVIS WITH CONTRAST TECHNIQUE: Multidetector CT imaging of the chest was performed using the standard protocol during  bolus administration of intravenous contrast. Multiplanar CT image reconstructions and MIPs were obtained to evaluate the vascular anatomy. Multidetector CT imaging of the abdomen and pelvis was performed using the standard protocol during bolus administration of intravenous contrast. CONTRAST:  125mL OMNIPAQUE IOHEXOL 350 MG/ML SOLN COMPARISON:   Chest x-ray from earlier in the same day FINDINGS: CTA CHEST FINDINGS Cardiovascular: Thoracic aorta demonstrates atherosclerotic calcifications without aneurysmal dilatation or dissection. Cardiac enlargement is noted. The pulmonary artery shows a normal branching pattern. No definitive filling defects are identified to suggest pulmonary embolism. Coronary calcifications are noted. Patent left upper arm dialysis fistula is noted. Mediastinum/Nodes: Thoracic inlet is within normal limits. No sizable hilar or mediastinal adenopathy is noted. The esophagus is within normal limits. Lungs/Pleura: Lungs demonstrate bilateral pleural effusions left greater than right with lower lobe consolidation left greater than right. Prominent central vasculature is noted. Mild scattered edematous changes are noted in the interstitial. No sizable parenchymal nodules are noted. Musculoskeletal: Degenerative changes of the thoracic spine are seen. No acute rib abnormality is noted. Review of the MIP images confirms the above findings. CT ABDOMEN and PELVIS FINDINGS Hepatobiliary: Nodular changes of liver are noted consistent with underlying cirrhosis. Multiple gallstones are seen without complicating factors. Pancreas: Unremarkable. No pancreatic ductal dilatation or surrounding inflammatory changes. Spleen: Spleen is enlarged consistent with the cirrhotic change and portal hypertension. Perisplenic varices are seen. No definitive esophageal or perigastric varices are noted at this time. Adrenals/Urinary Tract: Adrenal glands are within normal limits. Kidneys are well visualized bilaterally within normal enhancement pattern. Delayed images demonstrate no significant excretion of contrast consistent with the end-stage renal failure. The bladder is decompressed. Foley catheter is noted in place. Stomach/Bowel: Colon shows no obstructive or inflammatory changes. Appendix is not well visualized consistent with prior surgical history. Small  bowel and stomach appear within normal limits. Vascular/Lymphatic: Atherosclerotic calcifications are noted without aneurysmal dilatation. No significant lymphadenopathy is noted. Reproductive: Prostate is unremarkable. Other: Moderate ascites is noted primarily on the left. Musculoskeletal: Degenerative changes of lumbar spine are seen. Review of the MIP images confirms the above findings. IMPRESSION: CTA of the chest: No evidence of pulmonary embolism. Bilateral pleural effusions with bilateral lower lobe consolidation left greater than right. CT of the abdomen and pelvis: Changes of cirrhosis of the liver with subsequent portal hypertension and splenomegaly. Moderate ascites. No other focal abnormality is noted. Electronically Signed   By: Inez Catalina M.[name].   On: 04/01/2020 22:28   CT ABDOMEN PELVIS W CONTRAST  Result Date: 04/01/2020 CLINICAL DATA:  Lethargy following completing bowel prep for colonoscopy EXAM: CT ANGIOGRAPHY CHEST CT ABDOMEN AND PELVIS WITH CONTRAST TECHNIQUE: Multidetector CT imaging of the chest was performed using the standard protocol during bolus administration of intravenous contrast. Multiplanar CT image reconstructions and MIPs were obtained to evaluate the vascular anatomy. Multidetector CT imaging of the abdomen and pelvis was performed using the standard protocol during bolus administration of intravenous contrast. CONTRAST:  186mL OMNIPAQUE IOHEXOL 350 MG/ML SOLN COMPARISON:  Chest x-ray from earlier in the same day FINDINGS: CTA CHEST FINDINGS Cardiovascular: Thoracic aorta demonstrates atherosclerotic calcifications without aneurysmal dilatation or dissection. Cardiac enlargement is noted. The pulmonary artery shows a normal branching pattern. No definitive filling defects are identified to suggest pulmonary embolism. Coronary calcifications are noted. Patent left upper arm dialysis fistula is noted. Mediastinum/Nodes: Thoracic inlet is within normal limits. No sizable hilar  or mediastinal adenopathy is noted. The esophagus is within normal limits. Lungs/Pleura: Lungs demonstrate bilateral pleural effusions  left greater than right with lower lobe consolidation left greater than right. Prominent central vasculature is noted. Mild scattered edematous changes are noted in the interstitial. No sizable parenchymal nodules are noted. Musculoskeletal: Degenerative changes of the thoracic spine are seen. No acute rib abnormality is noted. Review of the MIP images confirms the above findings. CT ABDOMEN and PELVIS FINDINGS Hepatobiliary: Nodular changes of liver are noted consistent with underlying cirrhosis. Multiple gallstones are seen without complicating factors. Pancreas: Unremarkable. No pancreatic ductal dilatation or surrounding inflammatory changes. Spleen: Spleen is enlarged consistent with the cirrhotic change and portal hypertension. Perisplenic varices are seen. No definitive esophageal or perigastric varices are noted at this time. Adrenals/Urinary Tract: Adrenal glands are within normal limits. Kidneys are well visualized bilaterally within normal enhancement pattern. Delayed images demonstrate no significant excretion of contrast consistent with the end-stage renal failure. The bladder is decompressed. Foley catheter is noted in place. Stomach/Bowel: Colon shows no obstructive or inflammatory changes. Appendix is not well visualized consistent with prior surgical history. Small bowel and stomach appear within normal limits. Vascular/Lymphatic: Atherosclerotic calcifications are noted without aneurysmal dilatation. No significant lymphadenopathy is noted. Reproductive: Prostate is unremarkable. Other: Moderate ascites is noted primarily on the left. Musculoskeletal: Degenerative changes of lumbar spine are seen. Review of the MIP images confirms the above findings. IMPRESSION: CTA of the chest: No evidence of pulmonary embolism. Bilateral pleural effusions with bilateral lower  lobe consolidation left greater than right. CT of the abdomen and pelvis: Changes of cirrhosis of the liver with subsequent portal hypertension and splenomegaly. Moderate ascites. No other focal abnormality is noted. Electronically Signed   By: Inez Catalina M.[name].   On: 04/01/2020 22:28   US Paracentesis  Result Date: 04/02/2020 INDICATION: Cirrhosis, ascites EXAM: ULTRASOUND GUIDED DIAGNOSTIC AND THERAPEUTIC PARACENTESIS MEDICATIONS: None COMPLICATIONS: None immediate PROCEDURE: Informed written consent was obtained from the patient after a discussion of the risks, benefits and alternatives to treatment. A timeout was performed prior to the initiation of the procedure. Initial ultrasound scanning demonstrates a large amount of ascites within the LEFT lower abdominal quadrant. The right lower abdomen was prepped and draped in the usual sterile fashion. 1% lidocaine was used for local anesthesia. Following this, a 5 [place] Yueh catheter was introduced. An ultrasound image was saved for documentation purposes. The paracentesis was performed. The catheter was removed and a dressing was applied. The patient tolerated the procedure well without immediate post procedural complication. Patient received post-procedure intravenous albumin; see nursing notes for details. FINDINGS: A total of approximately 4.2 L of slightly cloudy yellow ascitic fluid was removed. Samples were sent to the laboratory as requested by the clinical team. IMPRESSION: Successful ultrasound-guided paracentesis yielding 4.2 liters of peritoneal fluid. Electronically Signed   By: Lavonia Dana M.[name].   On: 04/02/2020 13:52   DG Chest Port 1 View  Result Date: 04/01/2020 CLINICAL DATA:  Hypoxia, lethargy EXAM: PORTABLE CHEST 1 VIEW COMPARISON:  02/19/2020 FINDINGS: Single frontal view of the chest demonstrates an enlarged cardiac silhouette. There is central vascular congestion, with worsening left basilar consolidation. There are bilateral pleural  effusions, left greater than right. No pneumothorax. IMPRESSION: 1. Constellation of findings most consistent with congestive heart failure. Electronically Signed   By: Randa Ngo M.[name].   On: 04/01/2020 19:07    Orson Eva, DO  [facility]  If 7PM-7AM, please contact night-coverage [email or web address] Password [email or web address] 04/04/2020, 9:57 AM   LOS: 2 days

## 2020-04-04 NOTE — Progress Notes (Signed)
[facility] Progress Note   Assessment/ Plan:   Outpatient ordersas of late September the last time he was here-  [facility]:MWF Chair time: 12:15pm.4 hrs,Revaclear 300,edw96kg,138Na,2k, 2.5cal, 350/600.15g needles- Left AVF.No heparin  Assessment/Plan: 74 year old WM-  Very chronically ill with cirrhosis- resp failure at baseline and ESRD  1 somnolence and weakness-  Likely multifactorial from hypercarbia, hepatic encephalopathy, uremia, bacteremia, SBP.  Improved 2 ESRD: normally MWF at [facility] last night-early AM on schedule.  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 Cirrhosis-  on lactulose and rifampin, paracentesis with GPCs, on vanc 7. Chronic pain/anxiety -  On multiple agents 8.  Strep Mitis/ oralis bacteremia/ SBP: on vanc--> ceftriaxone, repeat blood cultures on 11/3 pending 9. Hyponatremia-  Due to volume-  Better a little-  Cont with aggressive UF  Subjective:    HD yesterday with 5L off.  Na better.       Objective:   BP (!) 128/40   Pulse 77   Temp 97.9 F (36.6 C) (Axillary)   Resp 17   Ht 5\' 6"  (1.676 m)   Wt 105 kg   SpO2 98%   BMI 37.36 kg/m   Physical Exam: Gen: NAD, sitting up eating breakfast CVS: RRR Resp: muffled breath sounds Abd: distended Ext:2+ tight pitting edema bilaterally ACCESS: LUE AVF + T/B  Labs: BMET Recent Labs  Lab 03/28/20 1209 04/01/20 1525 04/01/20 1901 04/02/20 0545 04/03/20 0317 04/04/20 0341  NA 131* 122* 121* 124* 124* 127*  K 4.1  4.6 4.0 3.5 3.9 3.7  CL 92* 89* 87* 88* 88* 94*  CO2 27 18* 24 26 25 26   GLUCOSE 122* 135* 135* 93 76 221*  BUN 24* 39* 40* 24* 33* 25*  CREATININE 2.97* 3.79* 3.97* 2.85* 3.53* 3.08*  CALCIUM 8.6* 8.2* 8.3* 8.0* 8.0* 7.9*  PHOS  --   --   --   --  4.5 3.7   CBC Recent Labs  Lab 03/28/20 1430 03/28/20 1430 04/01/20 1525 04/01/20 1901 04/02/20 0545 04/03/20 0317  WBC 10.1   < > 7.7 6.8 5.3 3.7*  NEUTROABS 9.0*  --   --  5.9  --   --   HGB 8.2*   < > 8.6* 8.4* 8.0* 7.5*  HCT 27.7*   < > 29.5* 27.7* 26.9* 24.9*  MCV 100.0   < > 101.7* 99.6 99.6 97.6  PLT 153   < > 173 195 162 141*   < > = values in this interval not displayed.      Medications:    . darbepoetin (ARANESP) injection - DIALYSIS  200 mcg Intravenous Q Wed-HD  . diltiazem  120 mg Oral Daily  . furosemide  40 mg Oral BID  . gabapentin  100 mg Oral QHS  . heparin  5,000 Units Subcutaneous Q8H  . iron polysaccharides  150 mg Oral QHS  . lactulose  10 g Oral Once  . lactulose  20 g Oral BID  .  metoprolol succinate  25 mg Oral Daily  . midodrine  10 mg Oral BID WC  . mirtazapine  30 mg Oral QHS  . pantoprazole  40 mg Oral Daily  . pravastatin  20 mg Oral q AM  . rifaximin  550 mg Oral BID  . spironolactone  50 mg Oral q AM  . tamsulosin  0.4 mg Oral QPM  . thiamine  100 mg Oral q AM  . venlafaxine  75 mg Oral TID WC     Madelon Lips, MD 04/04/2020, 9:27 AM

## 2020-04-05 ENCOUNTER — Inpatient Hospital Stay (HOSPITAL_COMMUNITY): Payer: Medicare Other

## 2020-04-05 DIAGNOSIS — K7031 Alcoholic cirrhosis of liver with ascites: Secondary | ICD-10-CM | POA: Diagnosis not present

## 2020-04-05 DIAGNOSIS — J9621 Acute and chronic respiratory failure with hypoxia: Secondary | ICD-10-CM | POA: Diagnosis not present

## 2020-04-05 DIAGNOSIS — K652 Spontaneous bacterial peritonitis: Principal | ICD-10-CM

## 2020-04-05 DIAGNOSIS — K7290 Hepatic failure, unspecified without coma: Secondary | ICD-10-CM | POA: Diagnosis not present

## 2020-04-05 DIAGNOSIS — R7881 Bacteremia: Secondary | ICD-10-CM | POA: Diagnosis not present

## 2020-04-05 LAB — CULTURE, BODY FLUID W GRAM STAIN -BOTTLE

## 2020-04-05 LAB — CBC
HCT: 30.7 % — ABNORMAL LOW (ref 39.0–52.0)
Hemoglobin: 8.9 g/dL — ABNORMAL LOW (ref 13.0–17.0)
MCH: 29.3 pg (ref 26.0–34.0)
MCHC: 29 g/dL — ABNORMAL LOW (ref 30.0–36.0)
MCV: 101 fL — ABNORMAL HIGH (ref 80.0–100.0)
Platelets: 147 10*3/uL — ABNORMAL LOW (ref 150–400)
RBC: 3.04 MIL/uL — ABNORMAL LOW (ref 4.22–5.81)
RDW: 17.3 % — ABNORMAL HIGH (ref 11.5–15.5)
WBC: 5.3 10*3/uL (ref 4.0–10.5)
nRBC: 0 % (ref 0.0–0.2)

## 2020-04-05 LAB — AMMONIA: Ammonia: 39 umol/L — ABNORMAL HIGH (ref 9–35)

## 2020-04-05 MED ORDER — ALBUMIN HUMAN 25 % IV SOLN
25.0000 g | INTRAVENOUS | Status: DC | PRN
  Administered 2020-04-06 (×2): 25 g via INTRAVENOUS

## 2020-04-05 NOTE — Progress Notes (Signed)
[facility] Progress Note   Assessment/ Plan:   Outpatient ordersas of late September the last time he was here-  [facility]:MWF Chair time: 12:15pm.4 hrs,Revaclear 300,edw96kg,138Na,2k, 2.5cal, 350/600.15g needles- Left AVF.No heparin  Assessment/Plan: 74 year old WM-  Very chronically ill with cirrhosis- resp failure at baseline and ESRD  1 somnolence and weakness-  Likely multifactorial from hypercarbia, hepatic encephalopathy, uremia, bacteremia, SBP.  Improved 2 ESRD: normally MWF at [facility] last night-early AM on schedule.  Unclear how compliant he is with his HD vs just non compliance with fluid restriction but is very overloaded-  Will attempt to get volume off this admit- Continue MWF schedule, healthy UF goal today 3 Hypertension/volume: very overloaded-  Aggressive UF with HD-  Use midodrine and albumin 4. 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 Cirrhosis-  on lactulose and rifampin, paracentesis with GPCs, on vanc 7. Chronic pain/anxiety -  On multiple agents 8.  Strep Mitis/ oralis bacteremia/ SBP: on vanc--> ceftriaxone, repeat blood cultures on 11/3 growing Klebsiella, TTE 11/4 no vegetations.  ? Abdominal source, imaging 11/1 without perf/ abscess 9. Hyponatremia-  Due to volume-  Better a little-  Cont with aggressive UF  Subjective:    Repeat blood cultures with Klebsiella. Already on ceftriaxone.  For HD today.   Objective:   BP 133/60 (BP Location: Right Arm)   Pulse 86   Temp 97.9 F (36.6 C)   Resp 18   Ht 5\' 6"  (1.676 m)   Wt 105 kg   SpO2 100%   BMI 37.36 kg/m   Physical Exam: Gen: NAD, sitting up eating breakfast CVS: RRR Resp: muffled breath sounds Abd: distended Ext:2+ tight pitting edema bilaterally ACCESS: LUE AVF +  T/B  Labs: BMET Recent Labs  Lab 04/01/20 1525 04/01/20 1901 04/02/20 0545 04/03/20 0317 04/04/20 0341  NA 122* 121* 124* 124* 127*  K 4.6 4.0 3.5 3.9 3.7  CL 89* 87* 88* 88* 94*  CO2 18* 24 26 25 26   GLUCOSE 135* 135* 93 76 221*  BUN 39* 40* 24* 33* 25*  CREATININE 3.79* 3.97* 2.85* 3.53* 3.08*  CALCIUM 8.2* 8.3* 8.0* 8.0* 7.9*  PHOS  --   --   --  4.5 3.7   CBC Recent Labs  Lab 04/01/20 1525 04/01/20 1901 04/02/20 0545 04/03/20 0317  WBC 7.7 6.8 5.3 3.7*  NEUTROABS  --  5.9  --   --   HGB 8.6* 8.4* 8.0* 7.5*  HCT 29.5* 27.7* 26.9* 24.9*  MCV 101.7* 99.6 99.6 97.6  PLT 173 195 162 141*      Medications:    . darbepoetin (ARANESP) injection - DIALYSIS  200 mcg Intravenous Q Wed-HD  . diltiazem  120 mg Oral Daily  . furosemide  40 mg Oral BID  . gabapentin  100 mg Oral QHS  . heparin  5,000 Units Subcutaneous Q8H  . iron polysaccharides  150 mg Oral QHS  . lactulose  10 g Oral Once  . lactulose  20 g Oral BID  . metoprolol succinate  25 mg Oral Daily  . midodrine  10 mg Oral BID WC  . mirtazapine  30 mg Oral QHS  . pantoprazole  40 mg Oral Daily  . pravastatin  20  mg Oral q AM  . rifaximin  550 mg Oral BID  . spironolactone  50 mg Oral q AM  . tamsulosin  0.4 mg Oral QPM  . thiamine  100 mg Oral q AM  . venlafaxine  75 mg Oral TID WC     Madelon Lips, MD 04/05/2020, 11:14 AM

## 2020-04-05 NOTE — Care Management Important Message (Signed)
Important Message  Patient Details  Name: Jeffrey [name] MRN: [id number] Date of Birth: Jul 11, 1945   Medicare Important Message Given:  Yes     [name] 04/05/2020, 4:08 PM

## 2020-04-05 NOTE — Progress Notes (Signed)
PROGRESS NOTE  Jeffrey [name] AST:[id number] DOB: 1946-02-13 DOA: 04/01/2020 PCP: Monico Blitz, MD  Brief History: 74 year old male with a history of ESRD(MWF), ASH Cirrhosis,diabetes mellitus type 2, atrial fibrillation, hypertension, and chronic respiratory failure on 2 L presenting with confusion, lethargy, and shortness of breath. On EMS arrival, he was unable to stand up from bed because he was somnolent.He was also noted to be hypoxic with EMS withoxygenSaturation in the 70-80 range. Apparently, the patient missed his usual scheduled dialysis session on 03/29/2020. He states that he has been intermittently missing his dialysis sessions, but he is unable to clarify exactly why. In the emergency department, the patient was placed on BiPAP secondary to his shortness of breath. Chest x-ray showed pulmonary edema. CTAchest was negative for PE but showed bilateral pleural effusions and bilateral lower lobe consolidations, with greater than right. CT of the abdomen and pelvis showed moderate ascites with liver cirrhosis and portal hypertensive changes with splenomegaly. CT of the brain was negative for acute findings. Nephrology was consulted and the patient was initially dialyzed on 04/01/2020. He was subsequently weaned off of BiPAP. Blood cultures obtained show Streptococcus mitis x2. The patient was started on vancomycin empirically.  Assessment/Plan: Acute on chronic respiratory failure with hypoxia and hypercarbia -Initially on BiPAP -Now stable on 2-3 L nasal cannula which is his home demand -Secondary to pulmonary edema and pneumonia  Acute metabolic encephalopathy -Multifactorial including bacteremia, hepatic encephalopathy, hypercarbia, hypoxia -Mental status continues to improve -11/4--mental status at baseline  Streptococcal and Klebsiella bacteremia -Source--SBP/intraabdominal -Continue ceftriaxone -11/3 Repeat blood cultures--Kleb  pneumo -Echocardiogram--EF 55-60%, no WMA  Lobar pneumonia -Check procalcitonin--2.94 -CTA chest shows bilateral lower lobe consolidation, left greater than right, no PE -continue ceftriaxone  Alcoholic liver cirrhosis with ascites/SBP -11/2/21paracentesis--4.2 L removed -ascites culture--S. mitis -WBC 272 from peritoneal fluid -11/5--attempted paracentesis-->mostly anasarca  Hepatic encephalopathy -Continue lactulose and rifaximin -Presented with ammonia 66 -mental status overall improved  Cellulitis right leg -continue ceftriaxone -imrproving  Hyponatremia -Secondary to liver cirrhosis and fluid overload -Anticipate improvement with dialysis  Hyperlipidemia -Continue statin  Essential hypertension -Continue metoprolol succinate  Chronic atrial fibrillation -Rate controlled -Continue metoprolol succinate and Cardizem CD -Not a good candidate for anticoagulation secondary to his liver cirrhosis and varices  Class 2 Obesity -BMI 39.07 -lifestyle modification  Indwelling Foley -patient does not want it removed -follow [facility] to have changed each month -last changed 03/14/20      Status is: Inpatient  Remains inpatient appropriate because:IV treatments appropriate due to intensity of illness or inability to take PO   Dispo: The patient is from:Home Anticipated d/c is LG:XQJJ Anticipated d/c date is: 2 days Patient currently not medically stable for d/c        Family Communication:spouse updated 11/4  Consultants:renal  Code Status: FULL   DVT Prophylaxis: [place] Heparin   Procedures: As Listed in Progress Note Above  Antibiotics: vanco 11/2>>11/3 Ceftriaxone 11/3>>      Subjective: Patient denies fevers, chills, headache, chest pain, dyspnea, nausea, vomiting, diarrhea, abdominal pain, dysuria, hematuria, hematochezia, and  melena.   Objective: Vitals:   04/04/20 1847 04/04/20 2212 04/05/20 0518 04/05/20 1326  BP: (!) 140/59 (!) 126/55 133/60 137/62  Pulse: 88 89 86 90  Resp: (!) 22 20 18 18   Temp: 98.3 F (36.8 C) 98.2 F (36.8 C) 97.9 F (36.6 C) 98.1 F (36.7 C)  TempSrc: Oral Oral  Oral  SpO2: 96% 100% 100% 98%  Weight:      Height:       No intake or output data in the 24 hours ending 04/05/20 1758 Weight change:  Exam:   General:  Pt is alert, follows commands appropriately, not in acute distress  HEENT: No icterus, No thrush, No neck mass, [place]/AT  Cardiovascular: RRR, S1/S2, no rubs, no gallops  Respiratory: bibasilar rales, L>L  Abdomen: Soft/+BS, non tender, non distended, no guarding  Extremities: 1+LE edema, No lymphangitis, No petechiae, No rashes, no synovitis   Data Reviewed: I have personally reviewed following labs and imaging studies Basic Metabolic Panel: Recent Labs  Lab 04/01/20 1525 04/01/20 1901 04/02/20 0545 04/03/20 0317 04/04/20 0341  NA 122* 121* 124* 124* 127*  K 4.6 4.0 3.5 3.9 3.7  CL 89* 87* 88* 88* 94*  CO2 18* 24 26 25 26   GLUCOSE 135* 135* 93 76 221*  BUN 39* 40* 24* 33* 25*  CREATININE 3.79* 3.97* 2.85* 3.53* 3.08*  CALCIUM 8.2* 8.3* 8.0* 8.0* 7.9*  PHOS  --   --   --  4.5 3.7   Liver Function Tests: Recent Labs  Lab 04/01/20 1901 04/02/20 0545 04/03/20 0317 04/04/20 0341  AST 21 20  --   --   ALT 24 21  --   --   ALKPHOS 161* 138*  --   --   BILITOT 1.0 0.9  --   --   PROT 6.8 6.3*  --   --   ALBUMIN 2.3* 2.4* 2.0* 1.9*   No results for input(s): LIPASE, AMYLASE in the last 168 hours. Recent Labs  Lab 04/01/20 1901 04/03/20 1141 04/04/20 0341  AMMONIA 66* 60* 68*   Coagulation Profile: No results for input(s): INR, PROTIME in the last 168 hours. CBC: Recent Labs  Lab 04/01/20 1525 04/01/20 1901 04/02/20 0545 04/03/20 0317  WBC 7.7 6.8 5.3 3.7*  NEUTROABS  --  5.9  --   --   HGB 8.6* 8.4* 8.0* 7.5*  HCT 29.5*  27.7* 26.9* 24.9*  MCV 101.7* 99.6 99.6 97.6  PLT 173 195 162 141*   Cardiac Enzymes: No results for input(s): CKTOTAL, CKMB, CKMBINDEX, TROPONINI in the last 168 hours. BNP: Invalid input(s): POCBNP CBG: Recent Labs  Lab 04/01/20 1838  GLUCAP 129*   HbA1C: Recent Labs    04/03/20 1453  HGBA1C 5.8*   Urine analysis:    Component Value Date/Time   COLORURINE YELLOW 04/01/2020 1945   APPEARANCEUR TURBID (A) 04/01/2020 1945   LABSPEC 1.011 04/01/2020 1945   PHURINE 5.0 04/01/2020 1945   GLUCOSEU NEGATIVE 04/01/2020 1945   HGBUR SMALL (A) 04/01/2020 1945   BILIRUBINUR NEGATIVE 04/01/2020 [place] NEGATIVE 04/01/2020 1945   PROTEINUR 30 (A) 04/01/2020 1945   NITRITE NEGATIVE 04/01/2020 1945   LEUKOCYTESUR LARGE (A) 04/01/2020 1945   Sepsis Labs: @LABRCNTIP (procalcitonin:4,lacticidven:4) ) Recent Results (from the past 240 hour(s))  SARS CORONAVIRUS 2 (Alleene Stoy 6-24 HRS) Nasopharyngeal Nasopharyngeal Swab     Status: None   Collection Time: 04/01/20 10:32 AM   Specimen: Nasopharyngeal Swab  Result Value Ref Range Status   SARS Coronavirus 2 NEGATIVE NEGATIVE Final    Comment: (NOTE) SARS-CoV-2 target nucleic acids are NOT DETECTED.  The SARS-CoV-2 RNA is generally detectable in upper and lower respiratory specimens during the acute phase of infection. Negative results do not preclude SARS-CoV-2 infection, do not rule out co-infections with other pathogens, and should not be used as the sole basis for treatment or other patient management decisions. Negative  results must be combined with clinical observations, patient history, and epidemiological information. The expected result is Negative.  Fact Sheet for Patients: SugarRoll.be  Fact Sheet for Healthcare Providers: https://www.woods-mathews.com/  This test is not yet approved or cleared by the [place] FDA and  has been authorized for detection and/or diagnosis  of SARS-CoV-2 by FDA under an Emergency Use Authorization (EUA). This EUA will remain  in effect (meaning this test can be used) for the duration of the COVID-19 declaration under Se ction 564(b)(1) of the Act, 21 U.S.C. section 360bbb-3(b)(1), unless the authorization is terminated or revoked sooner.  Performed at [facility], [place] [address], [place], [place] [address]   Blood Cultures (routine x 2)     Status: Abnormal   Collection Time: 04/01/20  7:03 PM   Specimen: Right Antecubital; Blood  Result Value Ref Range Status   Specimen Description   Final    RIGHT ANTECUBITAL Performed at [facility], [address] [place], [place] [address]    Special Requests   Final    BOTTLES DRAWN AEROBIC AND ANAEROBIC Blood Culture adequate volume Performed at [facility], [address], [place], [place] [address]    Culture  Setup Time   Final    GRAM POSITIVE COCCI WBC PRESENT, PREDOMINANTLY PMN Gram Stain Report Called to,Read Back By and Verified With: SHELTON,A@[id number] .2.2021 BY MATTHEWS, B  AEROBIC BOTTLE CRITICAL RESULT CALLED TO, READ BACK BY AND VERIFIED WITH: A COLE RN 04/02/20 AT 1845 SK Performed at [facility], [place] [address] [place], [place] [address]    Culture STREPTOCOCCUS MITIS/ORALIS (A)  Final   Report Status 04/04/2020 FINAL  Final   Organism ID, Bacteria STREPTOCOCCUS MITIS/ORALIS  Final      Susceptibility   Streptococcus mitis/oralis - MIC*    TETRACYCLINE 4 SENSITIVE Sensitive     VANCOMYCIN 0.5 SENSITIVE Sensitive     CLINDAMYCIN >=1 RESISTANT Resistant     PENICILLIN Value in next row Sensitive      SENSITIVE0.12    CEFTRIAXONE Value in next row Sensitive      SENSITIVE0.25    * STREPTOCOCCUS MITIS/ORALIS  Blood Cultures (routine x 2)     Status: Abnormal   Collection Time: 04/01/20  7:03 PM   Specimen: BLOOD RIGHT HAND  Result Value Ref Range Status   Specimen Description   Final    BLOOD RIGHT HAND Performed at [facility],  [address] [place], [place] [address]    Special Requests   Final    BOTTLES DRAWN AEROBIC AND ANAEROBIC Blood Culture adequate volume Performed at [facility], [address], [place], [place] [address]    Culture  Setup Time   Final    GRAM POSITIVE COCCI WBC PRESENT, PREDOMINANTLY PMN Gram Stain Report Called to,Read Back By and Verified With: SHELTON,A@1442  BY MATTHEWS, B 11.2.2021 AEROBIC BOTTLE CRITICAL VALUE NOTED.  VALUE IS CONSISTENT WITH PREVIOUSLY REPORTED AND CALLED VALUE.    Culture (A)  Final    STREPTOCOCCUS MITIS/ORALIS SUSCEPTIBILITIES PERFORMED ON PREVIOUS CULTURE WITHIN THE LAST 5 DAYS. Performed at [facility], [place] [address], [place],  [address]    Report Status 04/04/2020 FINAL  Final  Blood Culture ID Panel (Reflexed)     Status: Abnormal   Collection Time: 04/01/20  7:03 PM  Result Value Ref Range Status   Enterococcus faecalis NOT DETECTED NOT DETECTED Final   Enterococcus Faecium NOT DETECTED NOT DETECTED Final   Listeria monocytogenes NOT  DETECTED NOT DETECTED Final   Staphylococcus species NOT DETECTED NOT DETECTED Final   Staphylococcus aureus (BCID) NOT DETECTED NOT DETECTED Final   Staphylococcus epidermidis NOT DETECTED NOT DETECTED Final   Staphylococcus lugdunensis NOT DETECTED NOT DETECTED Final   Streptococcus species DETECTED (A) NOT DETECTED Final    Comment: Not Enterococcus species, Streptococcus agalactiae, Streptococcus pyogenes, or Streptococcus pneumoniae. CRITICAL RESULT CALLED TO, READ BACK BY AND VERIFIED WITH: A COLE RN 04/02/20 AT 1845 SK    Streptococcus agalactiae NOT DETECTED NOT DETECTED Final   Streptococcus pneumoniae NOT DETECTED NOT DETECTED Final   Streptococcus pyogenes NOT DETECTED NOT DETECTED Final   A.calcoaceticus-baumannii NOT DETECTED NOT DETECTED Final   Bacteroides fragilis NOT DETECTED NOT DETECTED Final   Enterobacterales NOT DETECTED NOT DETECTED Final   Enterobacter cloacae complex NOT DETECTED  NOT DETECTED Final   Escherichia coli NOT DETECTED NOT DETECTED Final   Klebsiella aerogenes NOT DETECTED NOT DETECTED Final   Klebsiella oxytoca NOT DETECTED NOT DETECTED Final   Klebsiella pneumoniae NOT DETECTED NOT DETECTED Final   Proteus species NOT DETECTED NOT DETECTED Final   Salmonella species NOT DETECTED NOT DETECTED Final   Serratia marcescens NOT DETECTED NOT DETECTED Final   Haemophilus influenzae NOT DETECTED NOT DETECTED Final   Neisseria meningitidis NOT DETECTED NOT DETECTED Final   Pseudomonas aeruginosa NOT DETECTED NOT DETECTED Final   Stenotrophomonas maltophilia NOT DETECTED NOT DETECTED Final   Candida albicans NOT DETECTED NOT DETECTED Final   Candida auris NOT DETECTED NOT DETECTED Final   Candida glabrata NOT DETECTED NOT DETECTED Final   Candida krusei NOT DETECTED NOT DETECTED Final   Candida parapsilosis NOT DETECTED NOT DETECTED Final   Candida tropicalis NOT DETECTED NOT DETECTED Final   Cryptococcus neoformans/gattii NOT DETECTED NOT DETECTED Final    Comment: Performed at [facility], [address], [place], [place] [address]  Resp Panel by RT PCR (RSV, Flu A&B, Covid) - Nasopharyngeal Swab     Status: None   Collection Time: 04/01/20  7:45 PM   Specimen: Nasopharyngeal Swab  Result Value Ref Range Status   SARS Coronavirus 2 by RT PCR NEGATIVE NEGATIVE Final    Comment: (NOTE) SARS-CoV-2 target nucleic acids are NOT DETECTED.  The SARS-CoV-2 RNA is generally detectable in upper respiratoy specimens during the acute phase of infection. The lowest concentration of SARS-CoV-2 viral copies this assay can detect is 131 copies/mL. A negative result does not preclude SARS-Cov-2 infection and should not be used as the sole basis for treatment or other patient management decisions. A negative result may occur with  improper specimen collection/handling, submission of specimen other than nasopharyngeal swab, presence of viral mutation(s) within  the areas targeted by this assay, and inadequate number of viral copies (<131 copies/mL). A negative result must be combined with clinical observations, patient history, and epidemiological information. The expected result is Negative.  Fact Sheet for Patients:  PinkCheek.be  Fact Sheet for Healthcare Providers:  GravelBags.it  This test is no t yet approved or cleared by the [place] FDA and  has been authorized for detection and/or diagnosis of SARS-CoV-2 by FDA under an Emergency Use Authorization (EUA). This EUA will remain  in effect (meaning this test can be used) for the duration of the COVID-19 declaration under Section 564(b)(1) of the Act, 21 U.S.C. section 360bbb-3(b)(1), unless the authorization is terminated or revoked sooner.     Influenza A by PCR NEGATIVE NEGATIVE Final   Influenza B by PCR  NEGATIVE NEGATIVE Final    Comment: (NOTE) The Xpert Xpress SARS-CoV-2/FLU/RSV assay is intended as an aid in  the diagnosis of influenza from Nasopharyngeal swab specimens and  should not be used as a sole basis for treatment. Nasal washings and  aspirates are unacceptable for Xpert Xpress SARS-CoV-2/FLU/RSV  testing.  Fact Sheet for Patients: PinkCheek.be  Fact Sheet for Healthcare Providers: GravelBags.it  This test is not yet approved or cleared by the [place] FDA and  has been authorized for detection and/or diagnosis of SARS-CoV-2 by  FDA under an Emergency Use Authorization (EUA). This EUA will remain  in effect (meaning this test can be used) for the duration of the  Covid-19 declaration under Section 564(b)(1) of the Act, 21  U.S.C. section 360bbb-3(b)(1), unless the authorization is  terminated or revoked.    Respiratory Syncytial Virus by PCR NEGATIVE NEGATIVE Final    Comment: (NOTE) Fact Sheet for  Patients: PinkCheek.be  Fact Sheet for Healthcare Providers: GravelBags.it  This test is not yet approved or cleared by the [place] FDA and  has been authorized for detection and/or diagnosis of SARS-CoV-2 by  FDA under an Emergency Use Authorization (EUA). This EUA will remain  in effect (meaning this test can be used) for the duration of the  COVID-19 declaration under Section 564(b)(1) of the Act, 21 U.S.C.  section 360bbb-3(b)(1), unless the authorization is terminated or  revoked. Performed at [facility], [address], [place], [place] [address]   MRSA PCR Screening     Status: None   Collection Time: 04/02/20 12:21 PM   Specimen: Nasal Mucosa; Nasopharyngeal  Result Value Ref Range Status   MRSA by PCR NEGATIVE NEGATIVE Final    Comment:        The GeneXpert MRSA Assay (FDA approved for NASAL specimens only), is one component of a comprehensive MRSA colonization surveillance program. It is not intended to diagnose MRSA infection nor to guide or monitor treatment for MRSA infections. Performed at [facility], [address], [place], [place] [address]   Gram stain     Status: None   Collection Time: 04/02/20  1:00 PM   Specimen: Peritoneal Washings  Result Value Ref Range Status   Specimen Description PERITONEAL  Final   Special Requests NONE  Final   Gram Stain   Final    CYTOSPIN SMEAR NO ORGANISMS SEEN WBC PRESENT,BOTH PMN AND MONONUCLEAR Performed at [facility], [address], [place], [place] [address]    Report Status 04/02/2020 FINAL  Final  Culture, body fluid-bottle     Status: Abnormal   Collection Time: 04/02/20  1:00 PM   Specimen: Peritoneal Washings  Result Value Ref Range Status   Specimen Description   Final    PERITONEAL Performed at [facility], [address], [place], [place] [address]    Special Requests   Final    10cc Performed at [facility], [address],  [place], [place] [address]    Gram Stain   Final    GRAM POSITIVE COCCI WBC PRESENT,BOTH PMN AND MONONUCLEAR Gram Stain Report Called to,Read Back By and Verified With: DIANA ZOLNOSKI @0924  04/03/20 BY [facility] AEROBIC BOTTLE ONLY Performed at [facility], [place] [address] [place], [place] [address]    Culture STREPTOCOCCUS MITIS/ORALIS (A)  Final   Report Status 04/05/2020 FINAL  Final   Organism ID, Bacteria STREPTOCOCCUS MITIS/ORALIS  Final      Susceptibility   Streptococcus mitis/oralis - MIC*  TETRACYCLINE 4 SENSITIVE Sensitive     VANCOMYCIN 0.5 SENSITIVE Sensitive     CLINDAMYCIN >=1 RESISTANT Resistant     PENICILLIN Value in next row Sensitive      SENSITIVE0.12    CEFTRIAXONE Value in next row Sensitive      SENSITIVE0.25    * STREPTOCOCCUS MITIS/ORALIS  Culture, blood (Routine X 2) w Reflex to ID Panel     Status: Abnormal (Preliminary result)   Collection Time: 04/03/20 11:44 AM   Specimen: BLOOD  Result Value Ref Range Status   Specimen Description   Final    BLOOD DIALYSIS Performed at [facility], [address], [place], [place] [address]    Special Requests   Final    BOTTLES DRAWN AEROBIC AND ANAEROBIC Blood Culture adequate volume Performed at [facility], [address] [place], [place] [address]    Culture  Setup Time   Final    IN BOTH AEROBIC AND ANAEROBIC BOTTLES GRAM NEGATIVE RODS Gram Stain Report Called to,Read Back By and Verified With: R WAGONER,RN@0241  04/04/20 MKELLY    Culture (A)  Final    KLEBSIELLA PNEUMONIAE SUSCEPTIBILITIES TO FOLLOW Performed at [facility], [place] [address], [place], [place] [address]    Report Status PENDING  Incomplete  Blood Culture ID Panel (Reflexed)     Status: Abnormal   Collection Time: 04/03/20 11:44 AM  Result Value Ref Range Status   Enterococcus faecalis NOT DETECTED NOT DETECTED Final   Enterococcus Faecium NOT DETECTED NOT DETECTED Final   Listeria monocytogenes NOT DETECTED NOT DETECTED  Final   Staphylococcus species NOT DETECTED NOT DETECTED Final   Staphylococcus aureus (BCID) NOT DETECTED NOT DETECTED Final   Staphylococcus epidermidis NOT DETECTED NOT DETECTED Final   Staphylococcus lugdunensis NOT DETECTED NOT DETECTED Final   Streptococcus species NOT DETECTED NOT DETECTED Final   Streptococcus agalactiae NOT DETECTED NOT DETECTED Final   Streptococcus pneumoniae NOT DETECTED NOT DETECTED Final   Streptococcus pyogenes NOT DETECTED NOT DETECTED Final   A.calcoaceticus-baumannii NOT DETECTED NOT DETECTED Final   Bacteroides fragilis NOT DETECTED NOT DETECTED Final   Enterobacterales DETECTED (A) NOT DETECTED Final    Comment: Enterobacterales represent a large order of gram negative bacteria, not a single organism. CRITICAL RESULT CALLED TO, READ BACK BY AND VERIFIED WITH: Forest Gleason PharmD 10:55 04/04/20 ([email or web address])    Enterobacter cloacae complex NOT DETECTED NOT DETECTED Final   Escherichia coli NOT DETECTED NOT DETECTED Final   Klebsiella aerogenes NOT DETECTED NOT DETECTED Final   Klebsiella oxytoca NOT DETECTED NOT DETECTED Final   Klebsiella pneumoniae DETECTED (A) NOT DETECTED Final    Comment: CRITICAL RESULT CALLED TO, READ BACK BY AND VERIFIED WITH: Forest Gleason PharmD 10:55 04/04/20 ([email or web address])    Proteus species NOT DETECTED NOT DETECTED Final   Salmonella species NOT DETECTED NOT DETECTED Final   Serratia marcescens NOT DETECTED NOT DETECTED Final   Haemophilus influenzae NOT DETECTED NOT DETECTED Final   Neisseria meningitidis NOT DETECTED NOT DETECTED Final   Pseudomonas aeruginosa NOT DETECTED NOT DETECTED Final   Stenotrophomonas maltophilia NOT DETECTED NOT DETECTED Final   Candida albicans NOT DETECTED NOT DETECTED Final   Candida auris NOT DETECTED NOT DETECTED Final   Candida glabrata NOT DETECTED NOT DETECTED Final   Candida krusei NOT DETECTED NOT DETECTED Final   Candida parapsilosis NOT DETECTED NOT DETECTED Final   Candida tropicalis NOT  DETECTED NOT DETECTED Final   Cryptococcus neoformans/gattii NOT DETECTED NOT DETECTED Final  CTX-M ESBL NOT DETECTED NOT DETECTED Final   Carbapenem resistance IMP NOT DETECTED NOT DETECTED Final   Carbapenem resistance KPC NOT DETECTED NOT DETECTED Final   Carbapenem resistance NDM NOT DETECTED NOT DETECTED Final   Carbapenem resist OXA 48 LIKE NOT DETECTED NOT DETECTED Final   Carbapenem resistance VIM NOT DETECTED NOT DETECTED Final    Comment: Performed at [facility], [place] [address], [place], [place] [address]  Culture, blood (Routine X 2) w Reflex to ID Panel     Status: None (Preliminary result)   Collection Time: 04/03/20  2:53 PM   Specimen: BLOOD  Result Value Ref Range Status   Specimen Description BLOOD RIGHT HAND  Final   Special Requests   Final    BOTTLES DRAWN AEROBIC AND ANAEROBIC Blood Culture adequate volume   Culture   Final    NO GROWTH 2 DAYS Performed at [facility], [address] [place], [place] [address]    Report Status PENDING  Incomplete     Scheduled Meds:  darbepoetin (ARANESP) injection - DIALYSIS  200 mcg Intravenous Q Wed-HD   diltiazem  120 mg Oral Daily   furosemide  40 mg Oral BID   gabapentin  100 mg Oral QHS   heparin  5,000 Units Subcutaneous Q8H   iron polysaccharides  150 mg Oral QHS   lactulose  10 g Oral Once   lactulose  20 g Oral BID   metoprolol succinate  25 mg Oral Daily   midodrine  10 mg Oral BID WC   mirtazapine  30 mg Oral QHS   pantoprazole  40 mg Oral Daily   pravastatin  20 mg Oral q AM   rifaximin  550 mg Oral BID   spironolactone  50 mg Oral q AM   tamsulosin  0.4 mg Oral QPM   thiamine  100 mg Oral q AM   venlafaxine  75 mg Oral TID WC   Continuous Infusions:  sodium chloride     sodium chloride     albumin human     cefTRIAXone (ROCEPHIN)  IV 2 g (04/05/20 1451)   ferric gluconate (FERRLECIT/NULECIT) IV 125 mg (04/05/20 1253)    Procedures/Studies: CT Head Wo  Contrast  Result Date: 04/01/2020 CLINICAL DATA:  Altered mental status. EXAM: CT HEAD WITHOUT CONTRAST TECHNIQUE: Contiguous axial images were obtained from the base of the skull through the vertex without intravenous contrast. COMPARISON:  Head CT 02/19/2020 FINDINGS: Brain: Stable degree of atrophy and chronic small vessel ischemia. No intracranial hemorrhage, mass effect, or midline shift. No hydrocephalus. The basilar cisterns are patent. No evidence of territorial infarct or acute ischemia. No extra-axial or intracranial fluid collection. Vascular: Atherosclerosis of skullbase vasculature without hyperdense vessel or abnormal calcification. Skull: No fracture or focal lesion. Sinuses/Orbits: No acute findings. Minimal opacification of lower left mastoid air cells also seen on prior exam. Paranasal sinuses are clear. Bilateral cataract resection. Other: None. IMPRESSION: 1. No acute intracranial abnormality. 2. Stable atrophy and chronic small vessel ischemia. Electronically Signed   By: Keith Rake M.D.   On: 04/01/2020 22:29   CT Angio Chest PE W and/or Wo Contrast  Result Date: 04/01/2020 CLINICAL DATA:  Lethargy following completing bowel prep for colonoscopy EXAM: CT ANGIOGRAPHY CHEST CT ABDOMEN AND PELVIS WITH CONTRAST TECHNIQUE: Multidetector CT imaging of the chest was performed using the standard protocol during bolus administration of intravenous contrast. Multiplanar CT image reconstructions and MIPs were obtained to evaluate the vascular anatomy. Multidetector  CT imaging of the abdomen and pelvis was performed using the standard protocol during bolus administration of intravenous contrast. CONTRAST:  147mL OMNIPAQUE IOHEXOL 350 MG/ML SOLN COMPARISON:  Chest x-ray from earlier in the same day FINDINGS: CTA CHEST FINDINGS Cardiovascular: Thoracic aorta demonstrates atherosclerotic calcifications without aneurysmal dilatation or dissection. Cardiac enlargement is noted. The pulmonary artery  shows a normal branching pattern. No definitive filling defects are identified to suggest pulmonary embolism. Coronary calcifications are noted. Patent left upper arm dialysis fistula is noted. Mediastinum/Nodes: Thoracic inlet is within normal limits. No sizable hilar or mediastinal adenopathy is noted. The esophagus is within normal limits. Lungs/Pleura: Lungs demonstrate bilateral pleural effusions left greater than right with lower lobe consolidation left greater than right. Prominent central vasculature is noted. Mild scattered edematous changes are noted in the interstitial. No sizable parenchymal nodules are noted. Musculoskeletal: Degenerative changes of the thoracic spine are seen. No acute rib abnormality is noted. Review of the MIP images confirms the above findings. CT ABDOMEN and PELVIS FINDINGS Hepatobiliary: Nodular changes of liver are noted consistent with underlying cirrhosis. Multiple gallstones are seen without complicating factors. Pancreas: Unremarkable. No pancreatic ductal dilatation or surrounding inflammatory changes. Spleen: Spleen is enlarged consistent with the cirrhotic change and portal hypertension. Perisplenic varices are seen. No definitive esophageal or perigastric varices are noted at this time. Adrenals/Urinary Tract: Adrenal glands are within normal limits. Kidneys are well visualized bilaterally within normal enhancement pattern. Delayed images demonstrate no significant excretion of contrast consistent with the end-stage renal failure. The bladder is decompressed. Foley catheter is noted in place. Stomach/Bowel: Colon shows no obstructive or inflammatory changes. Appendix is not well visualized consistent with prior surgical history. Small bowel and stomach appear within normal limits. Vascular/Lymphatic: Atherosclerotic calcifications are noted without aneurysmal dilatation. No significant lymphadenopathy is noted. Reproductive: Prostate is unremarkable. Other: Moderate  ascites is noted primarily on the left. Musculoskeletal: Degenerative changes of lumbar spine are seen. Review of the MIP images confirms the above findings. IMPRESSION: CTA of the chest: No evidence of pulmonary embolism. Bilateral pleural effusions with bilateral lower lobe consolidation left greater than right. CT of the abdomen and pelvis: Changes of cirrhosis of the liver with subsequent portal hypertension and splenomegaly. Moderate ascites. No other focal abnormality is noted. Electronically Signed   By: Inez Catalina M.D.   On: 04/01/2020 22:28   CT ABDOMEN PELVIS W CONTRAST  Result Date: 04/01/2020 CLINICAL DATA:  Lethargy following completing bowel prep for colonoscopy EXAM: CT ANGIOGRAPHY CHEST CT ABDOMEN AND PELVIS WITH CONTRAST TECHNIQUE: Multidetector CT imaging of the chest was performed using the standard protocol during bolus administration of intravenous contrast. Multiplanar CT image reconstructions and MIPs were obtained to evaluate the vascular anatomy. Multidetector CT imaging of the abdomen and pelvis was performed using the standard protocol during bolus administration of intravenous contrast. CONTRAST:  186mL OMNIPAQUE IOHEXOL 350 MG/ML SOLN COMPARISON:  Chest x-ray from earlier in the same day FINDINGS: CTA CHEST FINDINGS Cardiovascular: Thoracic aorta demonstrates atherosclerotic calcifications without aneurysmal dilatation or dissection. Cardiac enlargement is noted. The pulmonary artery shows a normal branching pattern. No definitive filling defects are identified to suggest pulmonary embolism. Coronary calcifications are noted. Patent left upper arm dialysis fistula is noted. Mediastinum/Nodes: Thoracic inlet is within normal limits. No sizable hilar or mediastinal adenopathy is noted. The esophagus is within normal limits. Lungs/Pleura: Lungs demonstrate bilateral pleural effusions left greater than right with lower lobe consolidation left greater than right. Prominent central  vasculature is noted. Mild scattered  edematous changes are noted in the interstitial. No sizable parenchymal nodules are noted. Musculoskeletal: Degenerative changes of the thoracic spine are seen. No acute rib abnormality is noted. Review of the MIP images confirms the above findings. CT ABDOMEN and PELVIS FINDINGS Hepatobiliary: Nodular changes of liver are noted consistent with underlying cirrhosis. Multiple gallstones are seen without complicating factors. Pancreas: Unremarkable. No pancreatic ductal dilatation or surrounding inflammatory changes. Spleen: Spleen is enlarged consistent with the cirrhotic change and portal hypertension. Perisplenic varices are seen. No definitive esophageal or perigastric varices are noted at this time. Adrenals/Urinary Tract: Adrenal glands are within normal limits. Kidneys are well visualized bilaterally within normal enhancement pattern. Delayed images demonstrate no significant excretion of contrast consistent with the end-stage renal failure. The bladder is decompressed. Foley catheter is noted in place. Stomach/Bowel: Colon shows no obstructive or inflammatory changes. Appendix is not well visualized consistent with prior surgical history. Small bowel and stomach appear within normal limits. Vascular/Lymphatic: Atherosclerotic calcifications are noted without aneurysmal dilatation. No significant lymphadenopathy is noted. Reproductive: Prostate is unremarkable. Other: Moderate ascites is noted primarily on the left. Musculoskeletal: Degenerative changes of lumbar spine are seen. Review of the MIP images confirms the above findings. IMPRESSION: CTA of the chest: No evidence of pulmonary embolism. Bilateral pleural effusions with bilateral lower lobe consolidation left greater than right. CT of the abdomen and pelvis: Changes of cirrhosis of the liver with subsequent portal hypertension and splenomegaly. Moderate ascites. No other focal abnormality is noted. Electronically  Signed   By: Inez Catalina M.D.   On: 04/01/2020 22:28   US Paracentesis  Result Date: 04/02/2020 INDICATION: Cirrhosis, ascites EXAM: ULTRASOUND GUIDED DIAGNOSTIC AND THERAPEUTIC PARACENTESIS MEDICATIONS: None COMPLICATIONS: None immediate PROCEDURE: Informed written consent was obtained from the patient after a discussion of the risks, benefits and alternatives to treatment. A timeout was performed prior to the initiation of the procedure. Initial ultrasound scanning demonstrates a large amount of ascites within the LEFT lower abdominal quadrant. The right lower abdomen was prepped and draped in the usual sterile fashion. 1% lidocaine was used for local anesthesia. Following this, a 5 [place] Yueh catheter was introduced. An ultrasound image was saved for documentation purposes. The paracentesis was performed. The catheter was removed and a dressing was applied. The patient tolerated the procedure well without immediate post procedural complication. Patient received post-procedure intravenous albumin; see nursing notes for details. FINDINGS: A total of approximately 4.2 L of slightly cloudy yellow ascitic fluid was removed. Samples were sent to the laboratory as requested by the clinical team. IMPRESSION: Successful ultrasound-guided paracentesis yielding 4.2 liters of peritoneal fluid. Electronically Signed   By: Lavonia Dana M.D.   On: 04/02/2020 13:52   DG Chest Port 1 View  Result Date: 04/01/2020 CLINICAL DATA:  Hypoxia, lethargy EXAM: PORTABLE CHEST 1 VIEW COMPARISON:  02/19/2020 FINDINGS: Single frontal view of the chest demonstrates an enlarged cardiac silhouette. There is central vascular congestion, with worsening left basilar consolidation. There are bilateral pleural effusions, left greater than right. No pneumothorax. IMPRESSION: 1. Constellation of findings most consistent with congestive heart failure. Electronically Signed   By: Randa Ngo M.D.   On: 04/01/2020 19:07   ECHOCARDIOGRAM  COMPLETE  Result Date: 04/04/2020    ECHOCARDIOGRAM REPORT   Patient Name:   EDWIN BAINES Date of Exam: 04/04/2020 Medical Rec #:  [id number]    Height:       66.0 in Accession #:    [id number]   Weight:  231.5 lb Date of Birth:  09/12/1945     BSA:          2.128 m Patient Age:    43 years     BP:           119/62 mmHg Patient Gender: M            HR:           83 bpm. Exam Location:  [name] Procedure: 2D Echo Indications:    Bacteremia 790.7 / R78.81  History:        Patient has prior history of Echocardiogram examinations, most                 recent 01/01/2020. Signs/Symptoms:Bacteremia; Risk                 Factors:Diabetes, Dyslipidemia, Hypertension and Former Smoker.                 ESRD, Alcoholic cirrhosis of liver with ascites.  Sonographer:    Leavy Cella RDCS (AE) Referring Phys: [phone] Linna Thebeau IMPRESSIONS  1. Left ventricular ejection fraction, by estimation, is 55 to 60%. The left ventricle has normal function. The left ventricle has no regional wall motion abnormalities. There is mild left ventricular hypertrophy. Left ventricular diastolic parameters are indeterminate.  2. Right ventricular systolic function is normal. The right ventricular size is normal.  3. Left atrial size was moderately dilated.  4. Right atrial size was moderately dilated.  5. The mitral valve is normal in structure. Trivial mitral valve regurgitation. No evidence of mitral stenosis.  6. The aortic valve is tricuspid. Aortic valve regurgitation is not visualized. No aortic stenosis is present.  7. The inferior vena cava is normal in size with greater than 50% respiratory variability, suggesting right atrial pressure of 3 mmHg. FINDINGS  Left Ventricle: Left ventricular ejection fraction, by estimation, is 55 to 60%. The left ventricle has normal function. The left ventricle has no regional wall motion abnormalities. The left ventricular internal cavity size was normal in size. There is  mild left ventricular  hypertrophy. Left ventricular diastolic parameters are indeterminate. Right Ventricle: The right ventricular size is normal. No increase in right ventricular wall thickness. Right ventricular systolic function is normal. Left Atrium: Left atrial size was moderately dilated. Right Atrium: Right atrial size was moderately dilated. Pericardium: There is no evidence of pericardial effusion. Mitral Valve: The mitral valve is normal in structure. There is mild thickening of the mitral valve leaflet(s). There is mild calcification of the mitral valve leaflet(s). Mild mitral annular calcification. Trivial mitral valve regurgitation. No evidence  of mitral valve stenosis. Tricuspid Valve: The tricuspid valve is normal in structure. Tricuspid valve regurgitation is not demonstrated. No evidence of tricuspid stenosis. Aortic Valve: The aortic valve is tricuspid. Aortic valve regurgitation is not visualized. No aortic stenosis is present. Aortic valve mean gradient measures 9.1 mmHg. Aortic valve peak gradient measures 18.9 mmHg. Aortic valve area, by VTI measures 1.87  cm. Pulmonic Valve: The pulmonic valve was not well visualized. Pulmonic valve regurgitation is not visualized. No evidence of pulmonic stenosis. Aorta: The aortic root is normal in size and structure. Pulmonary Artery: Indeterminant PASP, inadequate TR jet. Venous: The inferior vena cava is normal in size with greater than 50% respiratory variability, suggesting right atrial pressure of 3 mmHg. IAS/Shunts: No atrial level shunt detected by color flow Doppler.  LEFT VENTRICLE PLAX 2D LVIDd:         4.84  cm  Diastology LVIDs:         3.70 cm  LV e' medial:    9.46 cm/s LV PW:         1.16 cm  LV E/e' medial:  10.4 LV IVS:        1.10 cm  LV e' lateral:   11.40 cm/s LVOT diam:     1.90 cm  LV E/e' lateral: 8.6 LV SV:         78 LV SV Index:   37 LVOT Area:     2.84 cm  RIGHT VENTRICLE RV S prime:     10.70 cm/s TAPSE (M-mode): 1.4 cm LEFT ATRIUM              Index       RIGHT ATRIUM           Index LA diam:        4.20 cm 1.97 cm/m  RA Area:     26.50 cm LA Vol (A2C):   83.4 ml 39.19 ml/m RA Volume:   96.60 ml  45.39 ml/m LA Vol (A4C):   69.8 ml 32.80 ml/m LA Biplane Vol: 79.0 ml 37.12 ml/m  AORTIC VALVE AV Area (Vmax):    1.69 cm AV Area (Vmean):   1.68 cm AV Area (VTI):     1.87 cm AV Vmax:           217.18 cm/s AV Vmean:          140.390 cm/s AV VTI:            0.415 m AV Peak Grad:      18.9 mmHg AV Mean Grad:      9.1 mmHg LVOT Vmax:         129.82 cm/s LVOT Vmean:        82.958 cm/s LVOT VTI:          0.275 m LVOT/AV VTI ratio: 0.66  AORTA Ao Root diam: 3.10 cm MITRAL VALVE MV Area (PHT): 4.10 cm    SHUNTS MV Decel Time: 185 msec    Systemic VTI:  0.27 m MR Peak grad: 124.5 mmHg   Systemic Diam: 1.90 cm MR Mean grad: 78.0 mmHg MR Vmax:      558.00 cm/s MR Vmean:     400.0 cm/s MV E velocity: 98.50 cm/s MV A velocity: 44.60 cm/s MV E/A ratio:  2.21 Carlyle Dolly MD Electronically signed by Carlyle Dolly MD Signature Date/Time: 04/04/2020/3:49:51 PM    Final     Orson Eva, DO  [facility]  If 7PM-7AM, please contact night-coverage [email or web address] Password [email or web address] 04/05/2020, 5:58 PM   LOS: 3 days

## 2020-04-06 DIAGNOSIS — R188 Other ascites: Secondary | ICD-10-CM

## 2020-04-06 DIAGNOSIS — J9621 Acute and chronic respiratory failure with hypoxia: Secondary | ICD-10-CM | POA: Diagnosis not present

## 2020-04-06 DIAGNOSIS — E871 Hypo-osmolality and hyponatremia: Secondary | ICD-10-CM | POA: Diagnosis not present

## 2020-04-06 DIAGNOSIS — K7460 Unspecified cirrhosis of liver: Secondary | ICD-10-CM | POA: Diagnosis not present

## 2020-04-06 DIAGNOSIS — R7881 Bacteremia: Secondary | ICD-10-CM | POA: Diagnosis not present

## 2020-04-06 LAB — CULTURE, BLOOD (ROUTINE X 2): Special Requests: ADEQUATE

## 2020-04-06 MED ORDER — MIDODRINE HCL 10 MG PO TABS
10.0000 mg | ORAL_TABLET | Freq: Two times a day (BID) | ORAL | 1 refills | Status: DC
Start: 2020-04-06 — End: 2021-08-19

## 2020-04-06 MED ORDER — LEVOFLOXACIN 500 MG PO TABS
500.0000 mg | ORAL_TABLET | ORAL | 0 refills | Status: DC
Start: 2020-04-12 — End: 2020-04-06

## 2020-04-06 MED ORDER — LEVOFLOXACIN 500 MG PO TABS
500.0000 mg | ORAL_TABLET | ORAL | 0 refills | Status: DC
Start: 2020-04-08 — End: 2020-06-06

## 2020-04-06 MED ORDER — LEVOFLOXACIN 500 MG PO TABS
500.0000 mg | ORAL_TABLET | ORAL | Status: DC
  Administered 2020-04-06: 500 mg via ORAL
  Filled 2020-04-06: qty 1

## 2020-04-06 MED ORDER — SODIUM CHLORIDE 0.9 % IV SOLN
INTRAVENOUS | Status: DC | PRN
  Administered 2020-04-06: 250 mL via INTRAVENOUS

## 2020-04-06 NOTE — TOC Transition Note (Signed)
Transition of Care [name]) - CM/SW Discharge Note   Patient Details  Name: Jeffrey [name] MRN: [id number] Date of Birth: [birth year]  [facility] CM/SW Contact:  Natasha Bence, LCSW Phone Number: 04/06/2020, 1:09 PM   Clinical Narrative:    CSW received HH order for patient. Corene Cornea with [facility] agreeable to take patient again, but service will begin on 11/09. MD agreeable to start date. TOC signing off.      Barriers to Discharge: Continued Medical Work up   Patient Goals and CMS Choice Patient states their goals for this hospitalization and ongoing recovery are:: return home      Discharge Placement                       Discharge Plan and Services In-house Referral: Clinical Social Work              DME Arranged: Programmer, multimedia DME Agency: [employer] Date DME Agency Contacted: 04/03/20 Time DME Agency Contacted: [id number] Representative spoke with at DME Agency: [place] ([place]) Interventions     Readmission Risk Interventions Readmission Risk Prevention Plan 04/03/2020  Transportation Screening Complete  Medication Review Press photographer) Complete  HRI or [place] Complete  SW Recovery Care/Counseling Consult Complete  [place] Not Applicable  Some recent data might be hidden

## 2020-04-06 NOTE — Procedures (Signed)
   HEMODIALYSIS TREATMENT NOTE:  4 hour heparin-free HD completed via LUE AVF (15g/antegrade). Goal NOT met:  Unable to tolerate removal of 5-6 L as ordered.  UF was limited by hypotension despite cooling of dialysate, Midodrine, and Albumin.  Net UF 4.7L.  All blood was returned and hemostasis was achieved in 15 minutes.  No changes from pre-dialysis assessment.  Rockwell Alexandria, RN

## 2020-04-06 NOTE — Discharge Summary (Signed)
Physician Discharge Summary  Jeffrey [name] TDV:[id number] DOB: Sep 25, 1945 DOA: 04/01/2020  PCP: Monico Blitz, MD  Admit date: 04/01/2020 Discharge date: 04/06/2020  Admitted From: Home Disposition:  Home  Recommendations for Outpatient Follow-up:  1. Follow up with PCP in 1-2 weeks 2. Please obtain BMP/CBC in one week   Home Health: YES Equipment/Devices:HHPT  Discharge Condition: Stable CODE STATUS: FULL Diet recommendation: Heart Healthy / Carb Modified   Brief/Interim Summary: 74 year old male with a history of ESRD(MWF), ASH Cirrhosis,diabetes mellitus type 2, atrial fibrillation, hypertension, and chronic respiratory failure on 2 L presenting with confusion, lethargy, and shortness of breath. On EMS arrival, he was unable to stand up from bed because he was somnolent.He was also noted to be hypoxic with EMS withoxygenSaturation in the 70-80 range. Apparently, the patient missed his usual scheduled dialysis session on 03/29/2020. He states that he has been intermittently missing his dialysis sessions, but he is unable to clarify exactly why. In the emergency department, the patient was placed on BiPAP secondary to his shortness of breath. Chest x-ray showed pulmonary edema. CTAchest was negative for PE but showed bilateral pleural effusions and bilateral lower lobe consolidations, with greater than right. CT of the abdomen and pelvis showed moderate ascites with liver cirrhosis and portal hypertensive changes with splenomegaly. CT of the brain was negative for acute findings. Nephrology was consulted and the patient was initially dialyzed on 04/01/2020. He was subsequently weaned off of BiPAP. Blood cultures obtained show Streptococcus mitis x2. The patient was started on vancomycin empirically.  Ceftriaxone was subsequently started with second set of blood cultures grew Klebsiella.  Vanco was discontinued.   Discharge Diagnoses:  Acute on chronic respiratory failure  with hypoxia and hypercarbia -Initially on BiPAP -Now stable on 2-3 L nasal cannula which is his home demand -Secondary to pulmonary edema and pneumonia  Acute metabolic encephalopathy -Multifactorial including bacteremia, hepatic encephalopathy, hypercarbia, hypoxia -Mental status continues to improve -11/4--mental status at baseline  StreptococcalandKlebsiellabacteremia -Source--SBP/intraabdominal -Continuedceftriaxone>>>d/c home with levofloxacin with doses on 11/8, 11/10, 11/12 after dialysis -11/3Repeat blood cultures--Kleb pneumo -Echocardiogram--EF 55-60%, no WMA  Lobar pneumonia -Check procalcitonin--2.94 -CTA chest shows bilateral lower lobe consolidation, left greater than right, no PE -continued ceftriaxone  Alcoholic liver cirrhosis with ascites/SBP -11/2/21paracentesis--4.2 L removed -ascites culture--S. mitis -WBC 272 from peritoneal fluid -11/5--attempted paracentesis-->mostly anasarca  Hepatic encephalopathy -Continue lactulose and rifaximin -Presented with ammonia 66 -mental status overall improved  ESRD -MWF dialysis schedule -last HD on 04/05/20 -renal followed patient during hospitalization  Cellulitis right leg -continued ceftriaxone -imrproving  Hyponatremia -Secondary to liver cirrhosis and fluid overload -improvement with dialysis  Hyperlipidemia -Continue statin  Essential hypertension -Continue metoprolol succinate  Chronic atrial fibrillation -Rate controlled -Continue metoprolol succinate and Cardizem CD -Not a good candidate for anticoagulation secondary to his liver cirrhosis and varices  Class 2 Obesity -BMI 39.07 -lifestyle modification  Indwelling Foley -patient does not want it removed -follow [facility] to have changed each month -last changed 03/14/20      Discharge Instructions   Allergies as of 04/06/2020      Reactions   Chlorhexidine    unknown   Tape Itching, Rash        Medication List    STOP taking these medications   vitamin B-12 1000 MCG tablet Commonly known as: CYANOCOBALAMIN     TAKE these medications   acetaminophen 500 MG tablet Commonly known as: TYLENOL Take 1,000 mg by mouth every 6 (six) hours as needed for mild pain.  Alcohol Prep 70 % Pads Apply 1 each topically 3 (three) times daily.   cholestyramine 4 GM/DOSE powder Commonly known as: QUESTRAN Take 1 packet by mouth daily as needed for diarrhea or loose stools.   diltiazem 120 MG 24 hr capsule Commonly known as: Cardizem CD Take 1 capsule (120 mg total) by mouth daily.   furosemide 40 MG tablet Commonly known as: LASIX Take 40 mg by mouth 2 (two) times daily.   gabapentin 100 MG capsule Commonly known as: NEURONTIN TAKE 1 CAPSULE BY MOUTH AT BEDTIME   HYDROcodone-acetaminophen 5-325 MG tablet Commonly known as: NORCO/VICODIN Take 1 tablet by mouth 2 (two) times daily as needed for moderate pain or severe pain.   hydrocortisone 2.5 % rectal cream Commonly known as: Anusol-HC Place 1 application rectally 2 (two) times daily. What changed:   when to take this  reasons to take this   hydrOXYzine 25 MG capsule Commonly known as: VISTARIL Take 25 mg by mouth 3 (three) times daily as needed for itching.   iron polysaccharides 150 MG capsule Commonly known as: NIFEREX Take 150 mg by mouth at bedtime.   lactulose 10 GM/15ML solution Commonly known as: CHRONULAC Take 45 mLs (30 g total) by mouth 3 (three) times daily. What changed:   how much to take  when to take this  reasons to take this   levofloxacin 500 MG tablet Commonly known as: LEVAQUIN Take 1 tablet (500 mg total) by mouth every other day. Take after dialysis on MWF Start taking on: April 08, 2020   metoprolol succinate 25 MG 24 hr tablet Commonly known as: TOPROL-XL Take 1 tablet (25 mg total) by mouth daily. Take at bedtime  on Monday ,Wednesday and Friday due to dialaylisis What  changed:   when to take this  additional instructions   midodrine 10 MG tablet Commonly known as: PROAMATINE Take 1 tablet (10 mg total) by mouth 2 (two) times daily with a meal.   milk thistle 175 MG tablet Take 175 mg by mouth 3 (three) times daily.   mirtazapine 30 MG tablet Commonly known as: REMERON Take 1 tablet (30 mg total) by mouth at bedtime.   multivitamin with minerals Tabs tablet Take 1 tablet by mouth in the morning.   NovoFine 32G X 6 MM Misc Generic drug: Insulin Pen Needle Inject into the skin.   nystatin powder Commonly known as: MYCOSTATIN/NYSTOP Apply topically 3 (three) times daily. Into groin area and affected area   omega-3 acid ethyl esters 1 g capsule Commonly known as: LOVAZA Take 2 g by mouth 2 (two) times daily.   omeprazole 40 MG capsule Commonly known as: PRILOSEC Take 40 mg by mouth in the morning.   ondansetron 4 MG tablet Commonly known as: ZOFRAN Take 4 mg by mouth every 8 (eight) hours as needed for nausea or vomiting.   oxyCODONE-acetaminophen 5-325 MG tablet Commonly known as: PERCOCET/ROXICET Take 1 tablet by mouth 2 (two) times daily as needed for moderate pain.   pravastatin 20 MG tablet Commonly known as: PRAVACHOL Take 20 mg by mouth in the morning.   PROBIOTIC DAILY PO Take 1 tablet by mouth in the morning.   spironolactone 50 MG tablet Commonly known as: ALDACTONE Take 50 mg by mouth in the morning.   tamsulosin 0.4 MG Caps capsule Commonly known as: FLOMAX Take 0.4 mg by mouth every evening.   thiamine 100 MG tablet Take 100 mg by mouth in the morning. Vitamin B-1   tiZANidine  2 MG tablet Commonly known as: ZANAFLEX Take 2 mg by mouth 2 (two) times daily as needed for muscle spasms.   [name] Generic drug: insulin degludec Inject 120 Units into the skin at bedtime. When blood sugars are up What changed: additional instructions   triamcinolone cream 0.1 % Commonly  known as: KENALOG Apply 1 application topically 2 (two) times daily as needed (dry skin Itching).   venlafaxine 75 MG tablet Commonly known as: EFFEXOR Take 75 mg by mouth 3 (three) times daily with meals.   vitamin C 1000 MG tablet Take 1,000 mg by mouth 2 (two) times daily.   Xifaxan 550 MG Tabs tablet Generic drug: rifaximin TAKE 1 TABLET BY MOUTH TWICE DAILY What changed: how much to take            Durable Medical Equipment  (From admission, onward)         Start     Ordered   04/03/20 0943  For home use only DME lightweight manual wheelchair with seat cushion  Once       Comments: Patient suffers from liver cirrhosis, CHF, ESRD which impairs their ability to perform daily activities like walking in the home.  A cane will not resolve  issue with performing activities of daily living. A wheelchair will allow patient to safely perform daily activities. Patient is not able to propel themselves in the home using a standard weight wheelchair due to weakness Patient can self propel in the lightweight wheelchair. Length of need lifetime Accessories: elevating leg rests (ELRs), wheel locks, extensions and anti-tippers.   04/03/20 [facility] Follow up.   Why: HHPT.  Contact information: [place] [phone]             Allergies  Allergen Reactions  . Chlorhexidine     unknown  . Tape Itching and Rash    Consultations:  renal   Procedures/Studies: CT Head Wo Contrast  Result Date: 04/01/2020 CLINICAL DATA:  Altered mental status. EXAM: CT HEAD WITHOUT CONTRAST TECHNIQUE: Contiguous axial images were obtained from the base of the skull through the vertex without intravenous contrast. COMPARISON:  Head CT 02/19/2020 FINDINGS: Brain: Stable degree of atrophy and chronic small vessel ischemia. No intracranial hemorrhage, mass effect, or midline shift. No hydrocephalus.  The basilar cisterns are patent. No evidence of territorial infarct or acute ischemia. No extra-axial or intracranial fluid collection. Vascular: Atherosclerosis of skullbase vasculature without hyperdense vessel or abnormal calcification. Skull: No fracture or focal lesion. Sinuses/Orbits: No acute findings. Minimal opacification of lower left mastoid air cells also seen on prior exam. Paranasal sinuses are clear. Bilateral cataract resection. Other: None. IMPRESSION: 1. No acute intracranial abnormality. 2. Stable atrophy and chronic small vessel ischemia. Electronically Signed   By: Keith Rake M.D.   On: 04/01/2020 22:29   CT Angio Chest PE W and/or Wo Contrast  Result Date: 04/01/2020 CLINICAL DATA:  Lethargy following completing bowel prep for colonoscopy EXAM: CT ANGIOGRAPHY CHEST CT ABDOMEN AND PELVIS WITH CONTRAST TECHNIQUE: Multidetector CT imaging of the chest was performed using the standard protocol during bolus administration of intravenous contrast. Multiplanar CT image reconstructions and MIPs were obtained to evaluate the vascular anatomy. Multidetector CT imaging of the abdomen and pelvis was performed using the standard protocol during bolus administration of intravenous contrast. CONTRAST:  130mL OMNIPAQUE IOHEXOL 350  MG/ML SOLN COMPARISON:  Chest x-ray from earlier in the same day FINDINGS: CTA CHEST FINDINGS Cardiovascular: Thoracic aorta demonstrates atherosclerotic calcifications without aneurysmal dilatation or dissection. Cardiac enlargement is noted. The pulmonary artery shows a normal branching pattern. No definitive filling defects are identified to suggest pulmonary embolism. Coronary calcifications are noted. Patent left upper arm dialysis fistula is noted. Mediastinum/Nodes: Thoracic inlet is within normal limits. No sizable hilar or mediastinal adenopathy is noted. The esophagus is within normal limits. Lungs/Pleura: Lungs demonstrate bilateral pleural effusions left greater  than right with lower lobe consolidation left greater than right. Prominent central vasculature is noted. Mild scattered edematous changes are noted in the interstitial. No sizable parenchymal nodules are noted. Musculoskeletal: Degenerative changes of the thoracic spine are seen. No acute rib abnormality is noted. Review of the MIP images confirms the above findings. CT ABDOMEN and PELVIS FINDINGS Hepatobiliary: Nodular changes of liver are noted consistent with underlying cirrhosis. Multiple gallstones are seen without complicating factors. Pancreas: Unremarkable. No pancreatic ductal dilatation or surrounding inflammatory changes. Spleen: Spleen is enlarged consistent with the cirrhotic change and portal hypertension. Perisplenic varices are seen. No definitive esophageal or perigastric varices are noted at this time. Adrenals/Urinary Tract: Adrenal glands are within normal limits. Kidneys are well visualized bilaterally within normal enhancement pattern. Delayed images demonstrate no significant excretion of contrast consistent with the end-stage renal failure. The bladder is decompressed. Foley catheter is noted in place. Stomach/Bowel: Colon shows no obstructive or inflammatory changes. Appendix is not well visualized consistent with prior surgical history. Small bowel and stomach appear within normal limits. Vascular/Lymphatic: Atherosclerotic calcifications are noted without aneurysmal dilatation. No significant lymphadenopathy is noted. Reproductive: Prostate is unremarkable. Other: Moderate ascites is noted primarily on the left. Musculoskeletal: Degenerative changes of lumbar spine are seen. Review of the MIP images confirms the above findings. IMPRESSION: CTA of the chest: No evidence of pulmonary embolism. Bilateral pleural effusions with bilateral lower lobe consolidation left greater than right. CT of the abdomen and pelvis: Changes of cirrhosis of the liver with subsequent portal hypertension and  splenomegaly. Moderate ascites. No other focal abnormality is noted. Electronically Signed   By: Inez Catalina M.D.   On: 04/01/2020 22:28   CT ABDOMEN PELVIS W CONTRAST  Result Date: 04/01/2020 CLINICAL DATA:  Lethargy following completing bowel prep for colonoscopy EXAM: CT ANGIOGRAPHY CHEST CT ABDOMEN AND PELVIS WITH CONTRAST TECHNIQUE: Multidetector CT imaging of the chest was performed using the standard protocol during bolus administration of intravenous contrast. Multiplanar CT image reconstructions and MIPs were obtained to evaluate the vascular anatomy. Multidetector CT imaging of the abdomen and pelvis was performed using the standard protocol during bolus administration of intravenous contrast. CONTRAST:  170mL OMNIPAQUE IOHEXOL 350 MG/ML SOLN COMPARISON:  Chest x-ray from earlier in the same day FINDINGS: CTA CHEST FINDINGS Cardiovascular: Thoracic aorta demonstrates atherosclerotic calcifications without aneurysmal dilatation or dissection. Cardiac enlargement is noted. The pulmonary artery shows a normal branching pattern. No definitive filling defects are identified to suggest pulmonary embolism. Coronary calcifications are noted. Patent left upper arm dialysis fistula is noted. Mediastinum/Nodes: Thoracic inlet is within normal limits. No sizable hilar or mediastinal adenopathy is noted. The esophagus is within normal limits. Lungs/Pleura: Lungs demonstrate bilateral pleural effusions left greater than right with lower lobe consolidation left greater than right. Prominent central vasculature is noted. Mild scattered edematous changes are noted in the interstitial. No sizable parenchymal nodules are noted. Musculoskeletal: Degenerative changes of the thoracic spine are seen. No acute rib abnormality  is noted. Review of the MIP images confirms the above findings. CT ABDOMEN and PELVIS FINDINGS Hepatobiliary: Nodular changes of liver are noted consistent with underlying cirrhosis. Multiple gallstones  are seen without complicating factors. Pancreas: Unremarkable. No pancreatic ductal dilatation or surrounding inflammatory changes. Spleen: Spleen is enlarged consistent with the cirrhotic change and portal hypertension. Perisplenic varices are seen. No definitive esophageal or perigastric varices are noted at this time. Adrenals/Urinary Tract: Adrenal glands are within normal limits. Kidneys are well visualized bilaterally within normal enhancement pattern. Delayed images demonstrate no significant excretion of contrast consistent with the end-stage renal failure. The bladder is decompressed. Foley catheter is noted in place. Stomach/Bowel: Colon shows no obstructive or inflammatory changes. Appendix is not well visualized consistent with prior surgical history. Small bowel and stomach appear within normal limits. Vascular/Lymphatic: Atherosclerotic calcifications are noted without aneurysmal dilatation. No significant lymphadenopathy is noted. Reproductive: Prostate is unremarkable. Other: Moderate ascites is noted primarily on the left. Musculoskeletal: Degenerative changes of lumbar spine are seen. Review of the MIP images confirms the above findings. IMPRESSION: CTA of the chest: No evidence of pulmonary embolism. Bilateral pleural effusions with bilateral lower lobe consolidation left greater than right. CT of the abdomen and pelvis: Changes of cirrhosis of the liver with subsequent portal hypertension and splenomegaly. Moderate ascites. No other focal abnormality is noted. Electronically Signed   By: Inez Catalina M.D.   On: 04/01/2020 22:28   US Paracentesis  Result Date: 04/02/2020 INDICATION: Cirrhosis, ascites EXAM: ULTRASOUND GUIDED DIAGNOSTIC AND THERAPEUTIC PARACENTESIS MEDICATIONS: None COMPLICATIONS: None immediate PROCEDURE: Informed written consent was obtained from the patient after a discussion of the risks, benefits and alternatives to treatment. A timeout was performed prior to the  initiation of the procedure. Initial ultrasound scanning demonstrates a large amount of ascites within the LEFT lower abdominal quadrant. The right lower abdomen was prepped and draped in the usual sterile fashion. 1% lidocaine was used for local anesthesia. Following this, a 5 [place] Yueh catheter was introduced. An ultrasound image was saved for documentation purposes. The paracentesis was performed. The catheter was removed and a dressing was applied. The patient tolerated the procedure well without immediate post procedural complication. Patient received post-procedure intravenous albumin; see nursing notes for details. FINDINGS: A total of approximately 4.2 L of slightly cloudy yellow ascitic fluid was removed. Samples were sent to the laboratory as requested by the clinical team. IMPRESSION: Successful ultrasound-guided paracentesis yielding 4.2 liters of peritoneal fluid. Electronically Signed   By: Lavonia Dana M.D.   On: 04/02/2020 13:52   DG Chest Port 1 View  Result Date: 04/01/2020 CLINICAL DATA:  Hypoxia, lethargy EXAM: PORTABLE CHEST 1 VIEW COMPARISON:  02/19/2020 FINDINGS: Single frontal view of the chest demonstrates an enlarged cardiac silhouette. There is central vascular congestion, with worsening left basilar consolidation. There are bilateral pleural effusions, left greater than right. No pneumothorax. IMPRESSION: 1. Constellation of findings most consistent with congestive heart failure. Electronically Signed   By: Randa Ngo M.D.   On: 04/01/2020 19:07   ECHOCARDIOGRAM COMPLETE  Result Date: 04/04/2020    ECHOCARDIOGRAM REPORT   Patient Name:   Jeffrey [name] Date of Exam: 04/04/2020 Medical Rec #:  [id number]    Height:       66.0 in Accession #:    [id number]   Weight:       231.5 lb Date of Birth:  1946/01/13     BSA:          2.128 m Patient  Age:    82 years     BP:           119/62 mmHg Patient Gender: M            HR:           83 bpm. Exam Location:  [name] Procedure: 2D Echo  Indications:    Bacteremia 790.7 / R78.81  History:        Patient has prior history of Echocardiogram examinations, most                 recent 01/01/2020. Signs/Symptoms:Bacteremia; Risk                 Factors:Diabetes, Dyslipidemia, Hypertension and Former Smoker.                 ESRD, Alcoholic cirrhosis of liver with ascites.  Sonographer:    Leavy Cella RDCS (AE) Referring Phys: [phone] Raidon Swanner IMPRESSIONS  1. Left ventricular ejection fraction, by estimation, is 55 to 60%. The left ventricle has normal function. The left ventricle has no regional wall motion abnormalities. There is mild left ventricular hypertrophy. Left ventricular diastolic parameters are indeterminate.  2. Right ventricular systolic function is normal. The right ventricular size is normal.  3. Left atrial size was moderately dilated.  4. Right atrial size was moderately dilated.  5. The mitral valve is normal in structure. Trivial mitral valve regurgitation. No evidence of mitral stenosis.  6. The aortic valve is tricuspid. Aortic valve regurgitation is not visualized. No aortic stenosis is present.  7. The inferior vena cava is normal in size with greater than 50% respiratory variability, suggesting right atrial pressure of 3 mmHg. FINDINGS  Left Ventricle: Left ventricular ejection fraction, by estimation, is 55 to 60%. The left ventricle has normal function. The left ventricle has no regional wall motion abnormalities. The left ventricular internal cavity size was normal in size. There is  mild left ventricular hypertrophy. Left ventricular diastolic parameters are indeterminate. Right Ventricle: The right ventricular size is normal. No increase in right ventricular wall thickness. Right ventricular systolic function is normal. Left Atrium: Left atrial size was moderately dilated. Right Atrium: Right atrial size was moderately dilated. Pericardium: There is no evidence of pericardial effusion. Mitral Valve: The mitral valve is normal  in structure. There is mild thickening of the mitral valve leaflet(s). There is mild calcification of the mitral valve leaflet(s). Mild mitral annular calcification. Trivial mitral valve regurgitation. No evidence  of mitral valve stenosis. Tricuspid Valve: The tricuspid valve is normal in structure. Tricuspid valve regurgitation is not demonstrated. No evidence of tricuspid stenosis. Aortic Valve: The aortic valve is tricuspid. Aortic valve regurgitation is not visualized. No aortic stenosis is present. Aortic valve mean gradient measures 9.1 mmHg. Aortic valve peak gradient measures 18.9 mmHg. Aortic valve area, by VTI measures 1.87  cm. Pulmonic Valve: The pulmonic valve was not well visualized. Pulmonic valve regurgitation is not visualized. No evidence of pulmonic stenosis. Aorta: The aortic root is normal in size and structure. Pulmonary Artery: Indeterminant PASP, inadequate TR jet. Venous: The inferior vena cava is normal in size with greater than 50% respiratory variability, suggesting right atrial pressure of 3 mmHg. IAS/Shunts: No atrial level shunt detected by color flow Doppler.  LEFT VENTRICLE PLAX 2D LVIDd:         4.84 cm  Diastology LVIDs:         3.70 cm  LV e' medial:    9.46 cm/s  LV PW:         1.16 cm  LV E/e' medial:  10.4 LV IVS:        1.10 cm  LV e' lateral:   11.40 cm/s LVOT diam:     1.90 cm  LV E/e' lateral: 8.6 LV SV:         78 LV SV Index:   37 LVOT Area:     2.84 cm  RIGHT VENTRICLE RV S prime:     10.70 cm/s TAPSE (M-mode): 1.4 cm LEFT ATRIUM             Index       RIGHT ATRIUM           Index LA diam:        4.20 cm 1.97 cm/m  RA Area:     26.50 cm LA Vol (A2C):   83.4 ml 39.19 ml/m RA Volume:   96.60 ml  45.39 ml/m LA Vol (A4C):   69.8 ml 32.80 ml/m LA Biplane Vol: 79.0 ml 37.12 ml/m  AORTIC VALVE AV Area (Vmax):    1.69 cm AV Area (Vmean):   1.68 cm AV Area (VTI):     1.87 cm AV Vmax:           217.18 cm/s AV Vmean:          140.390 cm/s AV VTI:            0.415 m AV  Peak Grad:      18.9 mmHg AV Mean Grad:      9.1 mmHg LVOT Vmax:         129.82 cm/s LVOT Vmean:        82.958 cm/s LVOT VTI:          0.275 m LVOT/AV VTI ratio: 0.66  AORTA Ao Root diam: 3.10 cm MITRAL VALVE MV Area (PHT): 4.10 cm    SHUNTS MV Decel Time: 185 msec    Systemic VTI:  0.27 m MR Peak grad: 124.5 mmHg   Systemic Diam: 1.90 cm MR Mean grad: 78.0 mmHg MR Vmax:      558.00 cm/s MR Vmean:     400.0 cm/s MV E velocity: 98.50 cm/s MV A velocity: 44.60 cm/s MV E/A ratio:  2.21 Carlyle Dolly MD Electronically signed by Carlyle Dolly MD Signature Date/Time: 04/04/2020/3:49:51 PM    Final    [place] ASCITES (ABDOMEN LIMITED)  Result Date: 04/05/2020 CLINICAL DATA:  Abdominal distension EXAM: LIMITED ABDOMEN ULTRASOUND FOR ASCITES TECHNIQUE: Limited ultrasound survey for ascites was performed in all four abdominal quadrants. COMPARISON:  04/02/2020 FINDINGS: Scanning in all 4 quadrants of the abdomen shows a small amount of ascites significantly reduced when compared with the prior ultrasound examination. No sizable pocket to allow for safe paracentesis is noted. IMPRESSION: Mild ascites without safe pocket to allow for paracentesis. Electronically Signed   By: Inez Catalina M.D.   On: 04/05/2020 20:01        Discharge Exam: Vitals:   04/06/20 0600 04/06/20 0957  BP: (!) 119/53 (!) 127/58  Pulse: 87 86  Resp: 20   Temp: 98.2 F (36.8 C)   SpO2: 100%    Vitals:   04/06/20 0340 04/06/20 0400 04/06/20 0600 04/06/20 0957  BP: (!) 102/59 120/64 (!) 119/53 (!) 127/58  Pulse: 85 79 87 86  Resp:   20   Temp:   98.2 F (36.8 C)   TempSrc:      SpO2:   100%  Weight:      Height:        General: Pt is alert, awake, not in acute distress Cardiovascular: RRR, S1/S2 +, no rubs, no gallops Respiratory: bibasilar rales. No wheeze Abdominal: Soft, NT, ND, bowel sounds + Extremities: 1+LE edema, no cyanosis   The results of significant diagnostics from this hospitalization (including imaging,  microbiology, ancillary and laboratory) are listed below for reference.    Significant Diagnostic Studies: CT Head Wo Contrast  Result Date: 04/01/2020 CLINICAL DATA:  Altered mental status. EXAM: CT HEAD WITHOUT CONTRAST TECHNIQUE: Contiguous axial images were obtained from the base of the skull through the vertex without intravenous contrast. COMPARISON:  Head CT 02/19/2020 FINDINGS: Brain: Stable degree of atrophy and chronic small vessel ischemia. No intracranial hemorrhage, mass effect, or midline shift. No hydrocephalus. The basilar cisterns are patent. No evidence of territorial infarct or acute ischemia. No extra-axial or intracranial fluid collection. Vascular: Atherosclerosis of skullbase vasculature without hyperdense vessel or abnormal calcification. Skull: No fracture or focal lesion. Sinuses/Orbits: No acute findings. Minimal opacification of lower left mastoid air cells also seen on prior exam. Paranasal sinuses are clear. Bilateral cataract resection. Other: None. IMPRESSION: 1. No acute intracranial abnormality. 2. Stable atrophy and chronic small vessel ischemia. Electronically Signed   By: Keith Rake M.D.   On: 04/01/2020 22:29   CT Angio Chest PE W and/or Wo Contrast  Result Date: 04/01/2020 CLINICAL DATA:  Lethargy following completing bowel prep for colonoscopy EXAM: CT ANGIOGRAPHY CHEST CT ABDOMEN AND PELVIS WITH CONTRAST TECHNIQUE: Multidetector CT imaging of the chest was performed using the standard protocol during bolus administration of intravenous contrast. Multiplanar CT image reconstructions and MIPs were obtained to evaluate the vascular anatomy. Multidetector CT imaging of the abdomen and pelvis was performed using the standard protocol during bolus administration of intravenous contrast. CONTRAST:  173mL OMNIPAQUE IOHEXOL 350 MG/ML SOLN COMPARISON:  Chest x-ray from earlier in the same day FINDINGS: CTA CHEST FINDINGS Cardiovascular: Thoracic aorta demonstrates  atherosclerotic calcifications without aneurysmal dilatation or dissection. Cardiac enlargement is noted. The pulmonary artery shows a normal branching pattern. No definitive filling defects are identified to suggest pulmonary embolism. Coronary calcifications are noted. Patent left upper arm dialysis fistula is noted. Mediastinum/Nodes: Thoracic inlet is within normal limits. No sizable hilar or mediastinal adenopathy is noted. The esophagus is within normal limits. Lungs/Pleura: Lungs demonstrate bilateral pleural effusions left greater than right with lower lobe consolidation left greater than right. Prominent central vasculature is noted. Mild scattered edematous changes are noted in the interstitial. No sizable parenchymal nodules are noted. Musculoskeletal: Degenerative changes of the thoracic spine are seen. No acute rib abnormality is noted. Review of the MIP images confirms the above findings. CT ABDOMEN and PELVIS FINDINGS Hepatobiliary: Nodular changes of liver are noted consistent with underlying cirrhosis. Multiple gallstones are seen without complicating factors. Pancreas: Unremarkable. No pancreatic ductal dilatation or surrounding inflammatory changes. Spleen: Spleen is enlarged consistent with the cirrhotic change and portal hypertension. Perisplenic varices are seen. No definitive esophageal or perigastric varices are noted at this time. Adrenals/Urinary Tract: Adrenal glands are within normal limits. Kidneys are well visualized bilaterally within normal enhancement pattern. Delayed images demonstrate no significant excretion of contrast consistent with the end-stage renal failure. The bladder is decompressed. Foley catheter is noted in place. Stomach/Bowel: Colon shows no obstructive or inflammatory changes. Appendix is not well visualized consistent with prior surgical history. Small bowel and stomach appear within normal limits. Vascular/Lymphatic: Atherosclerotic calcifications are noted  without  aneurysmal dilatation. No significant lymphadenopathy is noted. Reproductive: Prostate is unremarkable. Other: Moderate ascites is noted primarily on the left. Musculoskeletal: Degenerative changes of lumbar spine are seen. Review of the MIP images confirms the above findings. IMPRESSION: CTA of the chest: No evidence of pulmonary embolism. Bilateral pleural effusions with bilateral lower lobe consolidation left greater than right. CT of the abdomen and pelvis: Changes of cirrhosis of the liver with subsequent portal hypertension and splenomegaly. Moderate ascites. No other focal abnormality is noted. Electronically Signed   By: Inez Catalina M.D.   On: 04/01/2020 22:28   CT ABDOMEN PELVIS W CONTRAST  Result Date: 04/01/2020 CLINICAL DATA:  Lethargy following completing bowel prep for colonoscopy EXAM: CT ANGIOGRAPHY CHEST CT ABDOMEN AND PELVIS WITH CONTRAST TECHNIQUE: Multidetector CT imaging of the chest was performed using the standard protocol during bolus administration of intravenous contrast. Multiplanar CT image reconstructions and MIPs were obtained to evaluate the vascular anatomy. Multidetector CT imaging of the abdomen and pelvis was performed using the standard protocol during bolus administration of intravenous contrast. CONTRAST:  131mL OMNIPAQUE IOHEXOL 350 MG/ML SOLN COMPARISON:  Chest x-ray from earlier in the same day FINDINGS: CTA CHEST FINDINGS Cardiovascular: Thoracic aorta demonstrates atherosclerotic calcifications without aneurysmal dilatation or dissection. Cardiac enlargement is noted. The pulmonary artery shows a normal branching pattern. No definitive filling defects are identified to suggest pulmonary embolism. Coronary calcifications are noted. Patent left upper arm dialysis fistula is noted. Mediastinum/Nodes: Thoracic inlet is within normal limits. No sizable hilar or mediastinal adenopathy is noted. The esophagus is within normal limits. Lungs/Pleura: Lungs demonstrate  bilateral pleural effusions left greater than right with lower lobe consolidation left greater than right. Prominent central vasculature is noted. Mild scattered edematous changes are noted in the interstitial. No sizable parenchymal nodules are noted. Musculoskeletal: Degenerative changes of the thoracic spine are seen. No acute rib abnormality is noted. Review of the MIP images confirms the above findings. CT ABDOMEN and PELVIS FINDINGS Hepatobiliary: Nodular changes of liver are noted consistent with underlying cirrhosis. Multiple gallstones are seen without complicating factors. Pancreas: Unremarkable. No pancreatic ductal dilatation or surrounding inflammatory changes. Spleen: Spleen is enlarged consistent with the cirrhotic change and portal hypertension. Perisplenic varices are seen. No definitive esophageal or perigastric varices are noted at this time. Adrenals/Urinary Tract: Adrenal glands are within normal limits. Kidneys are well visualized bilaterally within normal enhancement pattern. Delayed images demonstrate no significant excretion of contrast consistent with the end-stage renal failure. The bladder is decompressed. Foley catheter is noted in place. Stomach/Bowel: Colon shows no obstructive or inflammatory changes. Appendix is not well visualized consistent with prior surgical history. Small bowel and stomach appear within normal limits. Vascular/Lymphatic: Atherosclerotic calcifications are noted without aneurysmal dilatation. No significant lymphadenopathy is noted. Reproductive: Prostate is unremarkable. Other: Moderate ascites is noted primarily on the left. Musculoskeletal: Degenerative changes of lumbar spine are seen. Review of the MIP images confirms the above findings. IMPRESSION: CTA of the chest: No evidence of pulmonary embolism. Bilateral pleural effusions with bilateral lower lobe consolidation left greater than right. CT of the abdomen and pelvis: Changes of cirrhosis of the liver  with subsequent portal hypertension and splenomegaly. Moderate ascites. No other focal abnormality is noted. Electronically Signed   By: Inez Catalina M.D.   On: 04/01/2020 22:28   US Paracentesis  Result Date: 04/02/2020 INDICATION: Cirrhosis, ascites EXAM: ULTRASOUND GUIDED DIAGNOSTIC AND THERAPEUTIC PARACENTESIS MEDICATIONS: None COMPLICATIONS: None immediate PROCEDURE: Informed written consent was obtained from the patient after a  discussion of the risks, benefits and alternatives to treatment. A timeout was performed prior to the initiation of the procedure. Initial ultrasound scanning demonstrates a large amount of ascites within the LEFT lower abdominal quadrant. The right lower abdomen was prepped and draped in the usual sterile fashion. 1% lidocaine was used for local anesthesia. Following this, a 5 [place] Yueh catheter was introduced. An ultrasound image was saved for documentation purposes. The paracentesis was performed. The catheter was removed and a dressing was applied. The patient tolerated the procedure well without immediate post procedural complication. Patient received post-procedure intravenous albumin; see nursing notes for details. FINDINGS: A total of approximately 4.2 L of slightly cloudy yellow ascitic fluid was removed. Samples were sent to the laboratory as requested by the clinical team. IMPRESSION: Successful ultrasound-guided paracentesis yielding 4.2 liters of peritoneal fluid. Electronically Signed   By: Lavonia Dana M.D.   On: 04/02/2020 13:52   DG Chest Port 1 View  Result Date: 04/01/2020 CLINICAL DATA:  Hypoxia, lethargy EXAM: PORTABLE CHEST 1 VIEW COMPARISON:  02/19/2020 FINDINGS: Single frontal view of the chest demonstrates an enlarged cardiac silhouette. There is central vascular congestion, with worsening left basilar consolidation. There are bilateral pleural effusions, left greater than right. No pneumothorax. IMPRESSION: 1. Constellation of findings most consistent  with congestive heart failure. Electronically Signed   By: Randa Ngo M.D.   On: 04/01/2020 19:07   ECHOCARDIOGRAM COMPLETE  Result Date: 04/04/2020    ECHOCARDIOGRAM REPORT   Patient Name:   Jeffrey [name] Date of Exam: 04/04/2020 Medical Rec #:  [id number]    Height:       66.0 in Accession #:    [id number]   Weight:       231.5 lb Date of Birth:  08-22-1945     BSA:          2.128 m Patient Age:    74 years     BP:           119/62 mmHg Patient Gender: M            HR:           83 bpm. Exam Location:  [name] Procedure: 2D Echo Indications:    Bacteremia 790.7 / R78.81  History:        Patient has prior history of Echocardiogram examinations, most                 recent 01/01/2020. Signs/Symptoms:Bacteremia; Risk                 Factors:Diabetes, Dyslipidemia, Hypertension and Former Smoker.                 ESRD, Alcoholic cirrhosis of liver with ascites.  Sonographer:    Leavy Cella RDCS (AE) Referring Phys: [phone] Melika Reder IMPRESSIONS  1. Left ventricular ejection fraction, by estimation, is 55 to 60%. The left ventricle has normal function. The left ventricle has no regional wall motion abnormalities. There is mild left ventricular hypertrophy. Left ventricular diastolic parameters are indeterminate.  2. Right ventricular systolic function is normal. The right ventricular size is normal.  3. Left atrial size was moderately dilated.  4. Right atrial size was moderately dilated.  5. The mitral valve is normal in structure. Trivial mitral valve regurgitation. No evidence of mitral stenosis.  6. The aortic valve is tricuspid. Aortic valve regurgitation is not visualized. No aortic stenosis is present.  7. The inferior vena cava is normal in size  with greater than 50% respiratory variability, suggesting right atrial pressure of 3 mmHg. FINDINGS  Left Ventricle: Left ventricular ejection fraction, by estimation, is 55 to 60%. The left ventricle has normal function. The left ventricle has no regional wall  motion abnormalities. The left ventricular internal cavity size was normal in size. There is  mild left ventricular hypertrophy. Left ventricular diastolic parameters are indeterminate. Right Ventricle: The right ventricular size is normal. No increase in right ventricular wall thickness. Right ventricular systolic function is normal. Left Atrium: Left atrial size was moderately dilated. Right Atrium: Right atrial size was moderately dilated. Pericardium: There is no evidence of pericardial effusion. Mitral Valve: The mitral valve is normal in structure. There is mild thickening of the mitral valve leaflet(s). There is mild calcification of the mitral valve leaflet(s). Mild mitral annular calcification. Trivial mitral valve regurgitation. No evidence  of mitral valve stenosis. Tricuspid Valve: The tricuspid valve is normal in structure. Tricuspid valve regurgitation is not demonstrated. No evidence of tricuspid stenosis. Aortic Valve: The aortic valve is tricuspid. Aortic valve regurgitation is not visualized. No aortic stenosis is present. Aortic valve mean gradient measures 9.1 mmHg. Aortic valve peak gradient measures 18.9 mmHg. Aortic valve area, by VTI measures 1.87  cm. Pulmonic Valve: The pulmonic valve was not well visualized. Pulmonic valve regurgitation is not visualized. No evidence of pulmonic stenosis. Aorta: The aortic root is normal in size and structure. Pulmonary Artery: Indeterminant PASP, inadequate TR jet. Venous: The inferior vena cava is normal in size with greater than 50% respiratory variability, suggesting right atrial pressure of 3 mmHg. IAS/Shunts: No atrial level shunt detected by color flow Doppler.  LEFT VENTRICLE PLAX 2D LVIDd:         4.84 cm  Diastology LVIDs:         3.70 cm  LV e' medial:    9.46 cm/s LV PW:         1.16 cm  LV E/e' medial:  10.4 LV IVS:        1.10 cm  LV e' lateral:   11.40 cm/s LVOT diam:     1.90 cm  LV E/e' lateral: 8.6 LV SV:         78 LV SV Index:   37  LVOT Area:     2.84 cm  RIGHT VENTRICLE RV S prime:     10.70 cm/s TAPSE (M-mode): 1.4 cm LEFT ATRIUM             Index       RIGHT ATRIUM           Index LA diam:        4.20 cm 1.97 cm/m  RA Area:     26.50 cm LA Vol (A2C):   83.4 ml 39.19 ml/m RA Volume:   96.60 ml  45.39 ml/m LA Vol (A4C):   69.8 ml 32.80 ml/m LA Biplane Vol: 79.0 ml 37.12 ml/m  AORTIC VALVE AV Area (Vmax):    1.69 cm AV Area (Vmean):   1.68 cm AV Area (VTI):     1.87 cm AV Vmax:           217.18 cm/s AV Vmean:          140.390 cm/s AV VTI:            0.415 m AV Peak Grad:      18.9 mmHg AV Mean Grad:      9.1 mmHg LVOT Vmax:  129.82 cm/s LVOT Vmean:        82.958 cm/s LVOT VTI:          0.275 m LVOT/AV VTI ratio: 0.66  AORTA Ao Root diam: 3.10 cm MITRAL VALVE MV Area (PHT): 4.10 cm    SHUNTS MV Decel Time: 185 msec    Systemic VTI:  0.27 m MR Peak grad: 124.5 mmHg   Systemic Diam: 1.90 cm MR Mean grad: 78.0 mmHg MR Vmax:      558.00 cm/s MR Vmean:     400.0 cm/s MV E velocity: 98.50 cm/s MV A velocity: 44.60 cm/s MV E/A ratio:  2.21 Carlyle Dolly MD Electronically signed by Carlyle Dolly MD Signature Date/Time: 04/04/2020/3:49:51 PM    Final    [place] ASCITES (ABDOMEN LIMITED)  Result Date: 04/05/2020 CLINICAL DATA:  Abdominal distension EXAM: LIMITED ABDOMEN ULTRASOUND FOR ASCITES TECHNIQUE: Limited ultrasound survey for ascites was performed in all four abdominal quadrants. COMPARISON:  04/02/2020 FINDINGS: Scanning in all 4 quadrants of the abdomen shows a small amount of ascites significantly reduced when compared with the prior ultrasound examination. No sizable pocket to allow for safe paracentesis is noted. IMPRESSION: Mild ascites without safe pocket to allow for paracentesis. Electronically Signed   By: Inez Catalina M.D.   On: 04/05/2020 20:01     Microbiology: Recent Results (from the past 240 hour(s))  SARS CORONAVIRUS 2 (Stefan Markarian 6-24 HRS) Nasopharyngeal Nasopharyngeal Swab     Status: None   Collection Time:  04/01/20 10:32 AM   Specimen: Nasopharyngeal Swab  Result Value Ref Range Status   SARS Coronavirus 2 NEGATIVE NEGATIVE Final    Comment: (NOTE) SARS-CoV-2 target nucleic acids are NOT DETECTED.  The SARS-CoV-2 RNA is generally detectable in upper and lower respiratory specimens during the acute phase of infection. Negative results do not preclude SARS-CoV-2 infection, do not rule out co-infections with other pathogens, and should not be used as the sole basis for treatment or other patient management decisions. Negative results must be combined with clinical observations, patient history, and epidemiological information. The expected result is Negative.  Fact Sheet for Patients: SugarRoll.be  Fact Sheet for Healthcare Providers: https://www.woods-mathews.com/  This test is not yet approved or cleared by the [place] FDA and  has been authorized for detection and/or diagnosis of SARS-CoV-2 by FDA under an Emergency Use Authorization (EUA). This EUA will remain  in effect (meaning this test can be used) for the duration of the COVID-19 declaration under Se ction 564(b)(1) of the Act, 21 U.S.C. section 360bbb-3(b)(1), unless the authorization is terminated or revoked sooner.  Performed at [facility], [place] [address] [place], [place] [address]   Blood Cultures (routine x 2)     Status: Abnormal   Collection Time: 04/01/20  7:03 PM   Specimen: Right Antecubital; Blood  Result Value Ref Range Status   Specimen Description   Final    RIGHT ANTECUBITAL Performed at [facility], [address] [place], [place] [address]    Special Requests   Final    BOTTLES DRAWN AEROBIC AND ANAEROBIC Blood Culture adequate volume Performed at [facility], [address] [place], [place] [address]    Culture  Setup Time   Final    GRAM POSITIVE COCCI WBC PRESENT, PREDOMINANTLY PMN Gram Stain Report Called to,Read Back By and Verified  With: SHELTON,[id number] .2.2021 BY MATTHEWS, B  AEROBIC BOTTLE CRITICAL RESULT CALLED TO, READ BACK BY AND VERIFIED WITH: A COLE RN 04/02/20 AT 1845 SK Performed at  [facility], [place] [address], [place], [place] [address]    Culture STREPTOCOCCUS MITIS/ORALIS (A)  Final   Report Status 04/04/2020 FINAL  Final   Organism ID, Bacteria STREPTOCOCCUS MITIS/ORALIS  Final      Susceptibility   Streptococcus mitis/oralis - MIC*    TETRACYCLINE 4 SENSITIVE Sensitive     VANCOMYCIN 0.5 SENSITIVE Sensitive     CLINDAMYCIN >=1 RESISTANT Resistant     PENICILLIN Value in next row Sensitive      SENSITIVE0.12    CEFTRIAXONE Value in next row Sensitive      SENSITIVE0.25    * STREPTOCOCCUS MITIS/ORALIS  Blood Cultures (routine x 2)     Status: Abnormal   Collection Time: 04/01/20  7:03 PM   Specimen: BLOOD RIGHT HAND  Result Value Ref Range Status   Specimen Description   Final    BLOOD RIGHT HAND Performed at [facility], [address], [place], [place] [address]    Special Requests   Final    BOTTLES DRAWN AEROBIC AND ANAEROBIC Blood Culture adequate volume Performed at [facility], [address], [place], [place] [address]    Culture  Setup Time   Final    GRAM POSITIVE COCCI WBC PRESENT, PREDOMINANTLY PMN Gram Stain Report Called to,Read Back By and Verified With: SHELTON,A@[id number]  BY MATTHEWS, B 11.2.2021 AEROBIC BOTTLE CRITICAL VALUE NOTED.  VALUE IS CONSISTENT WITH PREVIOUSLY REPORTED AND CALLED VALUE.    Culture (A)  Final    STREPTOCOCCUS MITIS/ORALIS SUSCEPTIBILITIES PERFORMED ON PREVIOUS CULTURE WITHIN THE LAST 5 DAYS. Performed at [facility], [place] [address], [place], [place] [address]    Report Status 04/04/2020 FINAL  Final  Blood Culture ID Panel (Reflexed)     Status: Abnormal   Collection Time: 04/01/20  7:03 PM  Result Value Ref Range Status   Enterococcus faecalis NOT DETECTED NOT DETECTED Final   Enterococcus Faecium NOT DETECTED NOT DETECTED Final    Listeria monocytogenes NOT DETECTED NOT DETECTED Final   Staphylococcus species NOT DETECTED NOT DETECTED Final   Staphylococcus aureus (BCID) NOT DETECTED NOT DETECTED Final   Staphylococcus epidermidis NOT DETECTED NOT DETECTED Final   Staphylococcus lugdunensis NOT DETECTED NOT DETECTED Final   Streptococcus species DETECTED (A) NOT DETECTED Final    Comment: Not Enterococcus species, Streptococcus agalactiae, Streptococcus pyogenes, or Streptococcus pneumoniae. CRITICAL RESULT CALLED TO, READ BACK BY AND VERIFIED WITH: A COLE RN 04/02/20 AT 1845 SK    Streptococcus agalactiae NOT DETECTED NOT DETECTED Final   Streptococcus pneumoniae NOT DETECTED NOT DETECTED Final   Streptococcus pyogenes NOT DETECTED NOT DETECTED Final   A.calcoaceticus-baumannii NOT DETECTED NOT DETECTED Final   Bacteroides fragilis NOT DETECTED NOT DETECTED Final   Enterobacterales NOT DETECTED NOT DETECTED Final   Enterobacter cloacae complex NOT DETECTED NOT DETECTED Final   Escherichia coli NOT DETECTED NOT DETECTED Final   Klebsiella aerogenes NOT DETECTED NOT DETECTED Final   Klebsiella oxytoca NOT DETECTED NOT DETECTED Final   Klebsiella pneumoniae NOT DETECTED NOT DETECTED Final   Proteus species NOT DETECTED NOT DETECTED Final   Salmonella species NOT DETECTED NOT DETECTED Final   Serratia marcescens NOT DETECTED NOT DETECTED Final   Haemophilus influenzae NOT DETECTED NOT DETECTED Final   Neisseria meningitidis NOT DETECTED NOT DETECTED Final   Pseudomonas aeruginosa NOT DETECTED NOT DETECTED Final   Stenotrophomonas maltophilia NOT DETECTED NOT DETECTED Final   Candida albicans NOT DETECTED NOT DETECTED Final   Candida auris NOT DETECTED NOT DETECTED  Final   Candida glabrata NOT DETECTED NOT DETECTED Final   Candida krusei NOT DETECTED NOT DETECTED Final   Candida parapsilosis NOT DETECTED NOT DETECTED Final   Candida tropicalis NOT DETECTED NOT DETECTED Final   Cryptococcus neoformans/gattii NOT  DETECTED NOT DETECTED Final    Comment: Performed at [facility], [place] [address] [place], [place] [address]  Resp Panel by RT PCR (RSV, Flu A&B, Covid) - Nasopharyngeal Swab     Status: None   Collection Time: 04/01/20  7:45 PM   Specimen: Nasopharyngeal Swab  Result Value Ref Range Status   SARS Coronavirus 2 by RT PCR NEGATIVE NEGATIVE Final    Comment: (NOTE) SARS-CoV-2 target nucleic acids are NOT DETECTED.  The SARS-CoV-2 RNA is generally detectable in upper respiratoy specimens during the acute phase of infection. The lowest concentration of SARS-CoV-2 viral copies this assay can detect is 131 copies/mL. A negative result does not preclude SARS-Cov-2 infection and should not be used as the sole basis for treatment or other patient management decisions. A negative result may occur with  improper specimen collection/handling, submission of specimen other than nasopharyngeal swab, presence of viral mutation(s) within the areas targeted by this assay, and inadequate number of viral copies (<131 copies/mL). A negative result must be combined with clinical observations, patient history, and epidemiological information. The expected result is Negative.  Fact Sheet for Patients:  PinkCheek.be  Fact Sheet for Healthcare Providers:  GravelBags.it  This test is no t yet approved or cleared by the [place] FDA and  has been authorized for detection and/or diagnosis of SARS-CoV-2 by FDA under an Emergency Use Authorization (EUA). This EUA will remain  in effect (meaning this test can be used) for the duration of the COVID-19 declaration under Section 564(b)(1) of the Act, 21 U.S.C. section 360bbb-3(b)(1), unless the authorization is terminated or revoked sooner.     Influenza A by PCR NEGATIVE NEGATIVE Final   Influenza B by PCR NEGATIVE NEGATIVE Final    Comment: (NOTE) The Xpert Xpress SARS-CoV-2/FLU/RSV assay  is intended as an aid in  the diagnosis of influenza from Nasopharyngeal swab specimens and  should not be used as a sole basis for treatment. Nasal washings and  aspirates are unacceptable for Xpert Xpress SARS-CoV-2/FLU/RSV  testing.  Fact Sheet for Patients: PinkCheek.be  Fact Sheet for Healthcare Providers: GravelBags.it  This test is not yet approved or cleared by the [place] FDA and  has been authorized for detection and/or diagnosis of SARS-CoV-2 by  FDA under an Emergency Use Authorization (EUA). This EUA will remain  in effect (meaning this test can be used) for the duration of the  Covid-19 declaration under Section 564(b)(1) of the Act, 21  U.S.C. section 360bbb-3(b)(1), unless the authorization is  terminated or revoked.    Respiratory Syncytial Virus by PCR NEGATIVE NEGATIVE Final    Comment: (NOTE) Fact Sheet for Patients: PinkCheek.be  Fact Sheet for Healthcare Providers: GravelBags.it  This test is not yet approved or cleared by the [place] FDA and  has been authorized for detection and/or diagnosis of SARS-CoV-2 by  FDA under an Emergency Use Authorization (EUA). This EUA will remain  in effect (meaning this test can be used) for the duration of the  COVID-19 declaration under Section 564(b)(1) of the Act, 21 U.S.C.  section 360bbb-3(b)(1), unless the authorization is terminated or  revoked. Performed at [facility], [address] [place], [place] [address]   MRSA PCR Screening  Status: None   Collection Time: 04/02/20 12:21 PM   Specimen: Nasal Mucosa; Nasopharyngeal  Result Value Ref Range Status   MRSA by PCR NEGATIVE NEGATIVE Final    Comment:        The GeneXpert MRSA Assay (FDA approved for NASAL specimens only), is one component of a comprehensive MRSA colonization surveillance program. It is not intended to diagnose  MRSA infection nor to guide or monitor treatment for MRSA infections. Performed at [facility], [address] [place], [place] [address]   Gram stain     Status: None   Collection Time: 04/02/20  1:00 PM   Specimen: Peritoneal Washings  Result Value Ref Range Status   Specimen Description PERITONEAL  Final   Special Requests NONE  Final   Gram Stain   Final    CYTOSPIN SMEAR NO ORGANISMS SEEN WBC PRESENT,BOTH PMN AND MONONUCLEAR Performed at [facility], [address] [place], [place] [address]    Report Status 04/02/2020 FINAL  Final  Culture, body fluid-bottle     Status: Abnormal   Collection Time: 04/02/20  1:00 PM   Specimen: Peritoneal Washings  Result Value Ref Range Status   Specimen Description   Final    PERITONEAL Performed at [facility], [address] [place], [place] [address]    Special Requests   Final    10cc Performed at [facility], [address], [place], [place] [address]    Gram Stain   Final    GRAM POSITIVE COCCI WBC PRESENT,BOTH PMN AND MONONUCLEAR Gram Stain Report Called to,Read Back By and Verified With: DIANA ZOLNOSKI @0924  04/03/20 BY [facility] AEROBIC BOTTLE ONLY Performed at [facility], [place] [address] [place], [place] [address]    Culture STREPTOCOCCUS MITIS/ORALIS (A)  Final   Report Status 04/05/2020 FINAL  Final   Organism ID, Bacteria STREPTOCOCCUS MITIS/ORALIS  Final      Susceptibility   Streptococcus mitis/oralis - MIC*    TETRACYCLINE 4 SENSITIVE Sensitive     VANCOMYCIN 0.5 SENSITIVE Sensitive     CLINDAMYCIN >=1 RESISTANT Resistant     PENICILLIN Value in next row Sensitive      SENSITIVE0.12    CEFTRIAXONE Value in next row Sensitive      SENSITIVE0.25    * STREPTOCOCCUS MITIS/ORALIS  Culture, blood (Routine X 2) w Reflex to ID Panel     Status: Abnormal   Collection Time: 04/03/20 11:44 AM   Specimen: BLOOD  Result Value Ref Range Status   Specimen Description   Final    BLOOD DIALYSIS Performed at [facility], [address] [place], [place] [address]    Special Requests   Final    BOTTLES DRAWN AEROBIC AND ANAEROBIC Blood Culture adequate volume Performed at [facility], [address], [place], [place] [address]    Culture  Setup Time   Final    IN BOTH AEROBIC AND ANAEROBIC BOTTLES GRAM NEGATIVE RODS Gram Stain Report Called to,Read Back By and Verified With: R WAGONER,RN@0241  04/04/20 MKELLY    Culture KLEBSIELLA PNEUMONIAE (A)  Final   Report Status 04/06/2020 FINAL  Final   Organism ID, Bacteria KLEBSIELLA PNEUMONIAE  Final      Susceptibility   Klebsiella pneumoniae - MIC*    AMPICILLIN >=32 RESISTANT Resistant     CEFAZOLIN 16 SENSITIVE Sensitive     CEFEPIME <=0.12 SENSITIVE Sensitive     CEFTAZIDIME <=1 SENSITIVE Sensitive     CEFTRIAXONE <=0.25 SENSITIVE Sensitive  CIPROFLOXACIN <=0.25 SENSITIVE Sensitive     GENTAMICIN <=1 SENSITIVE Sensitive     IMIPENEM <=0.25 SENSITIVE Sensitive     TRIMETH/SULFA <=20 SENSITIVE Sensitive     AMPICILLIN/SULBACTAM 8 SENSITIVE Sensitive     PIP/TAZO <=4 SENSITIVE Sensitive     * KLEBSIELLA PNEUMONIAE  Blood Culture ID Panel (Reflexed)     Status: Abnormal   Collection Time: 04/03/20 11:44 AM  Result Value Ref Range Status   Enterococcus faecalis NOT DETECTED NOT DETECTED Final   Enterococcus Faecium NOT DETECTED NOT DETECTED Final   Listeria monocytogenes NOT DETECTED NOT DETECTED Final   Staphylococcus species NOT DETECTED NOT DETECTED Final   Staphylococcus aureus (BCID) NOT DETECTED NOT DETECTED Final   Staphylococcus epidermidis NOT DETECTED NOT DETECTED Final   Staphylococcus lugdunensis NOT DETECTED NOT DETECTED Final   Streptococcus species NOT DETECTED NOT DETECTED Final   Streptococcus agalactiae NOT DETECTED NOT DETECTED Final   Streptococcus pneumoniae NOT DETECTED NOT DETECTED Final   Streptococcus pyogenes NOT DETECTED NOT DETECTED Final   A.calcoaceticus-baumannii NOT DETECTED NOT DETECTED Final   Bacteroides  fragilis NOT DETECTED NOT DETECTED Final   Enterobacterales DETECTED (A) NOT DETECTED Final    Comment: Enterobacterales represent a large order of gram negative bacteria, not a single organism. CRITICAL RESULT CALLED TO, READ BACK BY AND VERIFIED WITH: Forest Gleason PharmD 10:55 04/04/20 ([email or web address])    Enterobacter cloacae complex NOT DETECTED NOT DETECTED Final   Escherichia coli NOT DETECTED NOT DETECTED Final   Klebsiella aerogenes NOT DETECTED NOT DETECTED Final   Klebsiella oxytoca NOT DETECTED NOT DETECTED Final   Klebsiella pneumoniae DETECTED (A) NOT DETECTED Final    Comment: CRITICAL RESULT CALLED TO, READ BACK BY AND VERIFIED WITH: Forest Gleason PharmD 10:55 04/04/20 ([email or web address])    Proteus species NOT DETECTED NOT DETECTED Final   Salmonella species NOT DETECTED NOT DETECTED Final   Serratia marcescens NOT DETECTED NOT DETECTED Final   Haemophilus influenzae NOT DETECTED NOT DETECTED Final   Neisseria meningitidis NOT DETECTED NOT DETECTED Final   Pseudomonas aeruginosa NOT DETECTED NOT DETECTED Final   Stenotrophomonas maltophilia NOT DETECTED NOT DETECTED Final   Candida albicans NOT DETECTED NOT DETECTED Final   Candida auris NOT DETECTED NOT DETECTED Final   Candida glabrata NOT DETECTED NOT DETECTED Final   Candida krusei NOT DETECTED NOT DETECTED Final   Candida parapsilosis NOT DETECTED NOT DETECTED Final   Candida tropicalis NOT DETECTED NOT DETECTED Final   Cryptococcus neoformans/gattii NOT DETECTED NOT DETECTED Final   CTX-M ESBL NOT DETECTED NOT DETECTED Final   Carbapenem resistance IMP NOT DETECTED NOT DETECTED Final   Carbapenem resistance KPC NOT DETECTED NOT DETECTED Final   Carbapenem resistance NDM NOT DETECTED NOT DETECTED Final   Carbapenem resist OXA 48 LIKE NOT DETECTED NOT DETECTED Final   Carbapenem resistance VIM NOT DETECTED NOT DETECTED Final    Comment: Performed at [facility], [place] [address], [place], [place] [address]  Culture, blood (Routine X 2)  w Reflex to ID Panel     Status: None (Preliminary result)   Collection Time: 04/03/20  2:53 PM   Specimen: BLOOD  Result Value Ref Range Status   Specimen Description BLOOD RIGHT HAND  Final   Special Requests   Final    BOTTLES DRAWN AEROBIC AND ANAEROBIC Blood Culture adequate volume   Culture   Final    NO GROWTH 2 DAYS Performed at [facility], [address] [place], [place] [address]  Report Status PENDING  Incomplete     Labs: Basic Metabolic Panel: Recent Labs  Lab 04/01/20 1525 04/01/20 1525 04/01/20 1901 04/01/20 1901 04/02/20 0545 04/02/20 0545 04/03/20 0317 04/04/20 0341  NA 122*  --  121*  --  124*  --  124* 127*  K 4.6   < > 4.0   < > 3.5   < > 3.9 3.7  CL 89*  --  87*  --  88*  --  88* 94*  CO2 18*  --  24  --  26  --  25 26  GLUCOSE 135*  --  135*  --  93  --  76 221*  BUN 39*  --  40*  --  24*  --  33* 25*  CREATININE 3.79*  --  3.97*  --  2.85*  --  3.53* 3.08*  CALCIUM 8.2*  --  8.3*  --  8.0*  --  8.0* 7.9*  PHOS  --   --   --   --   --   --  4.5 3.7   < > = values in this interval not displayed.   Liver Function Tests: Recent Labs  Lab 04/01/20 1901 04/02/20 0545 04/03/20 0317 04/04/20 0341  AST 21 20  --   --   ALT 24 21  --   --   ALKPHOS 161* 138*  --   --   BILITOT 1.0 0.9  --   --   PROT 6.8 6.3*  --   --   ALBUMIN 2.3* 2.4* 2.0* 1.9*   No results for input(s): LIPASE, AMYLASE in the last 168 hours. Recent Labs  Lab 04/01/20 1901 04/03/20 1141 04/04/20 0341 04/05/20 1824  AMMONIA 66* 60* 68* 39*   CBC: Recent Labs  Lab 04/01/20 1525 04/01/20 1901 04/02/20 0545 04/03/20 0317 04/05/20 1825  WBC 7.7 6.8 5.3 3.7* 5.3  NEUTROABS  --  5.9  --   --   --   HGB 8.6* 8.4* 8.0* 7.5* 8.9*  HCT 29.5* 27.7* 26.9* 24.9* 30.7*  MCV 101.7* 99.6 99.6 97.6 101.0*  PLT 173 195 162 141* 147*   Cardiac Enzymes: No results for input(s): CKTOTAL, CKMB, CKMBINDEX, TROPONINI in the last 168 hours. BNP: Invalid input(s):  POCBNP CBG: Recent Labs  Lab 04/01/20 1838  GLUCAP 129*    Time coordinating discharge:  36 minutes  Signed:  Orson Eva, DO [facility] Pager: [phone] 04/06/2020, 1:20 PM

## 2020-04-06 NOTE — Progress Notes (Signed)
Nsg Discharge Note  Admit Date:  04/01/2020 Discharge date: 04/06/2020   Jeffrey [name] to be D/C'd Home per MD order.  AVS completed.  Copy for chart, and copy for patient signed, and dated. Patient/caregiver able to verbalize understanding.  Discharge Medication: Allergies as of 04/06/2020      Reactions   Chlorhexidine    unknown   Tape Itching, Rash      Medication List    STOP taking these medications   vitamin B-12 1000 MCG tablet Commonly known as: CYANOCOBALAMIN     TAKE these medications   acetaminophen 500 MG tablet Commonly known as: TYLENOL Take 1,000 mg by mouth every 6 (six) hours as needed for mild pain.   Alcohol Prep 70 % Pads Apply 1 each topically 3 (three) times daily.   cholestyramine 4 GM/DOSE powder Commonly known as: QUESTRAN Take 1 packet by mouth daily as needed for diarrhea or loose stools.   diltiazem 120 MG 24 hr capsule Commonly known as: Cardizem CD Take 1 capsule (120 mg total) by mouth daily.   furosemide 40 MG tablet Commonly known as: LASIX Take 40 mg by mouth 2 (two) times daily.   gabapentin 100 MG capsule Commonly known as: NEURONTIN TAKE 1 CAPSULE BY MOUTH AT BEDTIME   HYDROcodone-acetaminophen 5-325 MG tablet Commonly known as: NORCO/VICODIN Take 1 tablet by mouth 2 (two) times daily as needed for moderate pain or severe pain.   hydrocortisone 2.5 % rectal cream Commonly known as: Anusol-HC Place 1 application rectally 2 (two) times daily. What changed:   when to take this  reasons to take this   hydrOXYzine 25 MG capsule Commonly known as: VISTARIL Take 25 mg by mouth 3 (three) times daily as needed for itching.   iron polysaccharides 150 MG capsule Commonly known as: NIFEREX Take 150 mg by mouth at bedtime.   lactulose 10 GM/15ML solution Commonly known as: CHRONULAC Take 45 mLs (30 g total) by mouth 3 (three) times daily. What changed:   how much to take  when to take this  reasons to take this    levofloxacin 500 MG tablet Commonly known as: LEVAQUIN Take 1 tablet (500 mg total) by mouth every other day. Take after dialysis on MWF Start taking on: April 08, 2020   metoprolol succinate 25 MG 24 hr tablet Commonly known as: TOPROL-XL Take 1 tablet (25 mg total) by mouth daily. Take at bedtime  on Monday ,Wednesday and Friday due to dialaylisis What changed:   when to take this  additional instructions   midodrine 10 MG tablet Commonly known as: PROAMATINE Take 1 tablet (10 mg total) by mouth 2 (two) times daily with a meal.   milk thistle 175 MG tablet Take 175 mg by mouth 3 (three) times daily.   mirtazapine 30 MG tablet Commonly known as: REMERON Take 1 tablet (30 mg total) by mouth at bedtime.   multivitamin with minerals Tabs tablet Take 1 tablet by mouth in the morning.   NovoFine 32G X 6 MM Misc Generic drug: Insulin Pen Needle Inject into the skin.   nystatin powder Commonly known as: MYCOSTATIN/NYSTOP Apply topically 3 (three) times daily. Into groin area and affected area   omega-3 acid ethyl esters 1 g capsule Commonly known as: LOVAZA Take 2 g by mouth 2 (two) times daily.   omeprazole 40 MG capsule Commonly known as: PRILOSEC Take 40 mg by mouth in the morning.   ondansetron 4 MG tablet Commonly known as: ZOFRAN  Take 4 mg by mouth every 8 (eight) hours as needed for nausea or vomiting.   oxyCODONE-acetaminophen 5-325 MG tablet Commonly known as: PERCOCET/ROXICET Take 1 tablet by mouth 2 (two) times daily as needed for moderate pain.   pravastatin 20 MG tablet Commonly known as: PRAVACHOL Take 20 mg by mouth in the morning.   PROBIOTIC DAILY PO Take 1 tablet by mouth in the morning.   spironolactone 50 MG tablet Commonly known as: ALDACTONE Take 50 mg by mouth in the morning.   tamsulosin 0.4 MG Caps capsule Commonly known as: FLOMAX Take 0.4 mg by mouth every evening.   thiamine 100 MG tablet Take 100 mg by mouth in the  morning. Vitamin B-1   tiZANidine 2 MG tablet Commonly known as: ZANAFLEX Take 2 mg by mouth 2 (two) times daily as needed for muscle spasms.   [name] Generic drug: insulin degludec Inject 120 Units into the skin at bedtime. When blood sugars are up What changed: additional instructions   triamcinolone cream 0.1 % Commonly known as: KENALOG Apply 1 application topically 2 (two) times daily as needed (dry skin Itching).   venlafaxine 75 MG tablet Commonly known as: EFFEXOR Take 75 mg by mouth 3 (three) times daily with meals.   vitamin C 1000 MG tablet Take 1,000 mg by mouth 2 (two) times daily.   Xifaxan 550 MG Tabs tablet Generic drug: rifaximin TAKE 1 TABLET BY MOUTH TWICE DAILY What changed: how much to take            Durable Medical Equipment  (From admission, onward)         Start     Ordered   04/03/20 0943  For home use only DME lightweight manual wheelchair with seat cushion  Once       Comments: Patient suffers from liver cirrhosis, CHF, ESRD which impairs their ability to perform daily activities like walking in the home.  A cane will not resolve  issue with performing activities of daily living. A wheelchair will allow patient to safely perform daily activities. Patient is not able to propel themselves in the home using a standard weight wheelchair due to weakness Patient can self propel in the lightweight wheelchair. Length of need lifetime Accessories: elevating leg rests (ELRs), wheel locks, extensions and anti-tippers.   04/03/20 0944          Discharge Assessment: Vitals:   04/06/20 0600 04/06/20 0957  BP: (!) 119/53 (!) 127/58  Pulse: 87 86  Resp: 20   Temp: 98.2 F (36.8 C)   SpO2: 100%    Skin clean, dry and intact without evidence of skin break down, no evidence of skin tears noted. IV catheter discontinued intact. Site without signs and symptoms of complications - no redness or edema noted at  insertion site, patient denies c/o pain - only slight tenderness at site.  Dressing with slight pressure applied.  D/c Instructions-Education: Discharge instructions given to patient/family with verbalized understanding. D/c education completed with patient/family including follow up instructions, medication list, d/c activities limitations if indicated, with other d/c instructions as indicated by MD - patient able to verbalize understanding, all questions fully answered. Patient instructed to return to ED, call 911, or call MD for any changes in condition.  Patient escorted via [place], and D/C home via private auto.  Rosalea Withrow Loletha Grayer, RN 04/06/2020 6:01 PM

## 2020-04-08 DIAGNOSIS — N2581 Secondary hyperparathyroidism of renal origin: Secondary | ICD-10-CM | POA: Diagnosis not present

## 2020-04-08 DIAGNOSIS — N186 End stage renal disease: Secondary | ICD-10-CM | POA: Diagnosis not present

## 2020-04-08 DIAGNOSIS — E878 Other disorders of electrolyte and fluid balance, not elsewhere classified: Secondary | ICD-10-CM | POA: Diagnosis not present

## 2020-04-08 DIAGNOSIS — Z992 Dependence on renal dialysis: Secondary | ICD-10-CM | POA: Diagnosis not present

## 2020-04-08 LAB — CULTURE, BLOOD (ROUTINE X 2)
Culture: NO GROWTH
Special Requests: ADEQUATE

## 2020-04-09 ENCOUNTER — Other Ambulatory Visit: Payer: Self-pay

## 2020-04-09 ENCOUNTER — Encounter: Payer: Self-pay | Admitting: Internal Medicine

## 2020-04-09 ENCOUNTER — Ambulatory Visit (INDEPENDENT_AMBULATORY_CARE_PROVIDER_SITE_OTHER): Payer: Medicare Other | Admitting: Internal Medicine

## 2020-04-09 VITALS — BP 120/58 | HR 96 | Temp 97.0°F | Ht 67.0 in | Wt 243.6 lb

## 2020-04-09 DIAGNOSIS — K625 Hemorrhage of anus and rectum: Secondary | ICD-10-CM

## 2020-04-09 DIAGNOSIS — G9341 Metabolic encephalopathy: Secondary | ICD-10-CM | POA: Diagnosis not present

## 2020-04-09 DIAGNOSIS — I8510 Secondary esophageal varices without bleeding: Secondary | ICD-10-CM | POA: Diagnosis not present

## 2020-04-09 DIAGNOSIS — K7460 Unspecified cirrhosis of liver: Secondary | ICD-10-CM

## 2020-04-09 DIAGNOSIS — L03115 Cellulitis of right lower limb: Secondary | ICD-10-CM | POA: Diagnosis not present

## 2020-04-09 DIAGNOSIS — J9622 Acute and chronic respiratory failure with hypercapnia: Secondary | ICD-10-CM | POA: Diagnosis not present

## 2020-04-09 DIAGNOSIS — J9621 Acute and chronic respiratory failure with hypoxia: Secondary | ICD-10-CM | POA: Diagnosis not present

## 2020-04-09 DIAGNOSIS — E871 Hypo-osmolality and hyponatremia: Secondary | ICD-10-CM | POA: Diagnosis not present

## 2020-04-09 DIAGNOSIS — K7290 Hepatic failure, unspecified without coma: Secondary | ICD-10-CM | POA: Diagnosis not present

## 2020-04-09 DIAGNOSIS — K7030 Alcoholic cirrhosis of liver without ascites: Secondary | ICD-10-CM

## 2020-04-09 DIAGNOSIS — D649 Anemia, unspecified: Secondary | ICD-10-CM | POA: Diagnosis not present

## 2020-04-09 DIAGNOSIS — B954 Other streptococcus as the cause of diseases classified elsewhere: Secondary | ICD-10-CM | POA: Diagnosis not present

## 2020-04-09 DIAGNOSIS — K652 Spontaneous bacterial peritonitis: Secondary | ICD-10-CM | POA: Diagnosis not present

## 2020-04-09 MED ORDER — LACTULOSE 10 GM/15ML PO SOLN: ORAL | 12 refills | Status: DC

## 2020-04-09 MED ORDER — LEVOFLOXACIN 500 MG PO TABS: ORAL_TABLET | ORAL | 11 refills | Status: DC

## 2020-04-09 NOTE — Progress Notes (Signed)
Primary Care Physician:  Monico Blitz, MD Primary Gastroenterologist:  Dr. Gala Romney  Pre-Procedure History & Physical: HPI:  Jeffrey [name] is a 74 y.o. 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  He was actually scheduled for a colonoscopy to evaluate rectal bleeding/anemia further but procedure was canceled due to his acute illness/hospitalization. Patient has occasional low-volume rectal bleeding when he is constipated.  Wife reports new episodes recently.  He is supposed to be taking lactulose 45 cc twice daily but when he does he has too much diarrhea and incontinence.  Currently taking 30 cc twice daily along with Xifaxan.  Also, on Levaquin 500 mg 3 times weekly after dialysis. He was prescribed cholestyramine at discharge earlier this week for diarrhea.  He has not started taking it. He continues to take hydrocodone Treated for C. difficile colitis at [facility] the first of this year.  Past Medical History:  Diagnosis Date  . A-fib [facility])    when initially stating dialysis january 2021 at [facility] per spouse; never heard anything else about it  . Alcoholic cirrhosis ([place])    patient reports completing hep a and b vaccines in 2006  . Anemia    has had 3 units of prbcs 2013  . Anxiety   . Arthritis   . Asymptomatic gallstones    Ultrasound in 2006  . C. difficile colitis   . Cholelithiasis   . Chronic kidney disease    Dialysis M/W/F/Sa  . Depression   . Diabetes ([place])   . Duodenal ulcer with hemorrhage    per patient in 2006 or 2007, records have been requested  . Dyspnea    low oxygen level -  has O2 2 L all day  . 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 GERD (gastroesophageal reflux disease)   . Heart burn   . History of  alcohol abuse    quit 05/2013  . HOH (hard of hearing)   . Hypertension   . Liver cirrhosis ([place])   . Splenomegaly    Ultrasound in 2006    Past Surgical History:  Procedure Laterality Date  . AGILE CAPSULE N/A 09/20/2019   Procedure: AGILE CAPSULE;  Surgeon: Daneil Dolin, MD;  [facility];  Service: Endoscopy;  Laterality: N/A;  . APPENDECTOMY    . AV FISTULA PLACEMENT Left 08/17/2019   Procedure: ARTERIOVENOUS (AV) FISTULA CREATION VERSES ARTERIOVENOUS GRAFT;  Surgeon: [name], MD;  Location: [place];  Service: Vascular;  Laterality: Left;  . BASCILIC VEIN TRANSPOSITION Left 09/28/2019   Procedure: LEFT SECOND STAGE BASCILIC VEIN TRANSPOSITION;  Surgeon: [name], MD;  Location: [facility];  Service: Vascular;  Laterality: Left;  . BIOPSY  02/03/2018   Procedure: BIOPSY;  Surgeon: Daneil Dolin, MD;  [facility];  Service: Endoscopy;;  bx of gastric polyps  . BIOPSY  09/19/2019   Procedure: BIOPSY;  Surgeon: Danie Binder, MD;  [facility];  Service: Endoscopy;;  . CATARACT EXTRACTION Right   . COLONOSCOPY  03/10/2005   Rectal polyp as described above, removed with snare. Left sided  diverticula. The remainder of the colonic mucosa appeared normal. Inflammed polyp on path.  . COLONOSCOPY  04/1999   Dr. Thea Silversmith polyps removed,  Path showed hyperplastic  . COLONOSCOPY  10/2015   Dr. Britta Mccreedy: diverticulosis, single sessile tubular adenoma 3-53mm in size removed from descending colon.   . COLONOSCOPY WITH ESOPHAGOGASTRODUODENOSCOPY (EGD)  03/31/2012   ERX:[id number] AVMs. Colonic diverticulosis. tubular adenoma colon  . COLONOSCOPY WITH ESOPHAGOGASTRODUODENOSCOPY (EGD)  06/2019   FORSYTH: small esophageal varices without high risk stigmata, single large AVM on lesser curvature in gastric body s/p APC ablation, gastric antral and duodenal bulb polyposis. No significant source to explain transfusion dependent anemia. Colonoscopy with portal  colopathy and diffuse edema, changes of Cdiff colitis on colonoscopy  . ESOPHAGEAL BANDING N/A 02/03/2018   Procedure: ESOPHAGEAL BANDING;  Surgeon: Daneil Dolin, MD;  [facility];  Service: Endoscopy;  Laterality: N/A;  . ESOPHAGOGASTRODUODENOSCOPY  01/15/2005   Three columns grade 1 to 2 esophageal varices, otherwise normal esophageal mucosa.  Esophagus was not manipulated otherwise./Nodularity of the antrum with overlying erosions, nonspecific finding. Path showed rare H.pylori  . ESOPHAGOGASTRODUODENOSCOPY  09/2008   Dr. Gaylene Brooks columns of grade 2-3 esoph varices, only one column was prominent. Portal gastropathy, multiple gastrc polyps at antrum, two were 2cm with black eschar, bulbar polyps, bulbar erosions  . ESOPHAGOGASTRODUODENOSCOPY  11/2004   Dr. Brantley Stage 3 esoph varices  . ESOPHAGOGASTRODUODENOSCOPY  03/31/2012   RMR: 4 columns(3-GR 2, 1-GR1) non-bleeding esophageal varices, portal gastropathy, small HH, early GAVE, multiple gastric polyps   . ESOPHAGOGASTRODUODENOSCOPY (EGD) WITH PROPOFOL N/A 02/03/2018   Dr. Gala Romney: Esophageal varices, 3 columns grade 1-2.  Portal hypertensive gastropathy.  Multiple gastric polyps, biopsy consistent with hyperplastic.  [name] ESOPHAGOGASTRODUODENOSCOPY (EGD) WITH PROPOFOL N/A 09/19/2019   Fields: grade I and II esophageal varcies, mild portal hypertensive gastropathy, moderate gastritis but no H. pylori, single hyperplastic gastric polyp removed, obvious source for melena/transfusion dependent anemia not identified, may be due to friable gastric and duodenal mucosa in the setting of portal hypertension  . GASTRIC VARICES BANDING  03/31/2012   Procedure: GASTRIC VARICES BANDING;  Surgeon: Daneil Dolin, MD;  [facility];  Service: Endoscopy;  Laterality: N/A;  . GIVENS CAPSULE STUDY N/A 09/21/2019   Poor study with a lot of debris obstructing much of the view of the first approximate 3 hours out of 6 hours.  No obvious  source of bleeding identified.  Sequela of bleeding and old blood seen in the form of"whisps" of blood, blood flecks mostly toward the end of the study.  . IR REMOVAL TUN CV CATH W/O FL  02/15/2020  . POLYPECTOMY  09/19/2019   Procedure: POLYPECTOMY;  Surgeon: Danie Binder, MD;  [facility];  Service: Endoscopy;;  . UMBILICAL HERNIA REPAIR  2017   exploratory laparotomy, abdominal washout    Prior to Admission medications   Medication Sig Start Date End Date Taking? Authorizing Provider  acetaminophen (TYLENOL) 500 MG tablet Take 1,000 mg by mouth every 6 (six) hours as needed for mild pain.    Yes [provider]  Alcohol Swabs (ALCOHOL PREP) 70 % PADS Apply 1 each topically 3 (three) times daily. 01/31/20  Yes [provider]  Ascorbic Acid (VITAMIN C) 1000 MG tablet Take 1,000 mg by mouth 2 (two) times daily.   Yes [provider]  cholestyramine Lucrezia Starch) 4 GM/DOSE powder Take 1 packet by mouth daily as needed for diarrhea or loose stools. 03/28/20  Yes [provider]  diltiazem (CARDIZEM CD) 120 MG 24 hr capsule Take 1 capsule (120 mg total) by mouth daily. 02/10/20 02/09/21 Yes Emokpae, Courage, MD  furosemide (LASIX) 40 MG  tablet Take 40 mg by mouth 2 (two) times daily. 12/29/19  Yes [provider]  gabapentin (NEURONTIN) 100 MG capsule TAKE 1 CAPSULE BY MOUTH AT BEDTIME Patient taking differently: Take 100 mg by mouth at bedtime.  01/30/20  Yes Waynetta Sandy, MD  HYDROcodone-acetaminophen (NORCO/VICODIN) 5-325 MG tablet Take 1 tablet by mouth 2 (two) times daily as needed for moderate pain or severe pain.  01/16/20  Yes [provider]  hydrocortisone (ANUSOL-HC) 2.5 % rectal cream Place 1 application rectally 2 (two) times daily. Patient taking differently: Place 1 application rectally 2 (two) times daily as needed for hemorrhoids or anal itching.  03/27/20  Yes Erenest Rasher, PA-C  hydrOXYzine (VISTARIL) 25  MG capsule Take 25 mg by mouth 3 (three) times daily as needed for itching.  01/23/20  Yes [provider]  insulin degludec (TRESIBA FLEXTOUCH) 200 UNIT/ML FlexTouch Pen Inject 120 Units into the skin at bedtime. When blood sugars are up Patient taking differently: Inject 120 Units into the skin at bedtime.  02/20/20  Yes Barton Dubois, MD  iron polysaccharides (NIFEREX) 150 MG capsule Take 150 mg by mouth at bedtime.    Yes [provider]  lactulose (CHRONULAC) 10 GM/15ML solution Take 45 mLs (30 g total) by mouth 3 (three) times daily. Patient taking differently: Take 30 g by mouth. 1-2 times per day 01/06/20  Yes Barton Dubois, MD  levofloxacin (LEVAQUIN) 500 MG tablet Take 1 tablet (500 mg total) by mouth every other day. Take after dialysis on MWF 04/08/20  Yes Tat, Shanon Brow, MD  metoprolol succinate (TOPROL-XL) 25 MG 24 hr tablet Take 1 tablet (25 mg total) by mouth daily. Take at bedtime  on Monday ,Wednesday and Friday due to dialaylisis Patient taking differently: Take 25 mg by mouth See admin instructions. Take 25 mg by mouth at bedtime every night and 25 mg in morning on  Sun, Tues, Thurs and Sat 01/06/20  Yes Barton Dubois, MD  midodrine (PROAMATINE) 10 MG tablet Take 1 tablet (10 mg total) by mouth 2 (two) times daily with a meal. 04/06/20  Yes Tat, Shanon Brow, MD  milk thistle 175 MG tablet Take 175 mg by mouth 3 (three) times daily.    Yes [provider]  mirtazapine (REMERON) 30 MG tablet Take 1 tablet (30 mg total) by mouth at bedtime. 02/10/20  Yes Roxan Hockey, MD  Multiple Vitamin (MULTIVITAMIN WITH MINERALS) TABS tablet Take 1 tablet by mouth in the morning.    Yes [provider]  NOVOFINE 32G X 6 MM MISC Inject into the skin. 01/31/20  Yes [provider]  nystatin (MYCOSTATIN/NYSTOP) powder Apply topically 3 (three) times daily. Into groin area and affected area 01/06/20  Yes Barton Dubois, MD  omega-3 acid ethyl esters (LOVAZA) 1 g capsule  Take 2 g by mouth 2 (two) times daily.  04/07/19  Yes [provider]  omeprazole (PRILOSEC) 40 MG capsule Take 40 mg by mouth in the morning.    Yes [provider]  ondansetron (ZOFRAN) 4 MG tablet Take 4 mg by mouth every 8 (eight) hours as needed for nausea or vomiting.    Yes [provider]  oxyCODONE-acetaminophen (PERCOCET/ROXICET) 5-325 MG tablet Take 1 tablet by mouth 2 (two) times daily as needed for moderate pain.  02/27/20  Yes [provider]  pravastatin (PRAVACHOL) 20 MG tablet Take 20 mg by mouth in the morning.    Yes [provider]  Probiotic Product (PROBIOTIC DAILY PO)  Take 1 tablet by mouth in the morning.    Yes [provider]  spironolactone (ALDACTONE) 50 MG tablet Take 50 mg by mouth in the morning.    Yes [provider]  tamsulosin (FLOMAX) 0.4 MG CAPS capsule Take 0.4 mg by mouth every evening.   Yes [provider]  thiamine 100 MG tablet Take 100 mg by mouth in the morning. Vitamin B-1   Yes [provider]  tiZANidine (ZANAFLEX) 2 MG tablet Take 2 mg by mouth 2 (two) times daily as needed for muscle spasms.  02/29/20  Yes [provider]  triamcinolone cream (KENALOG) 0.1 % Apply 1 application topically 2 (two) times daily as needed (dry skin Itching).  12/30/17  Yes [provider]  venlafaxine (EFFEXOR) 75 MG tablet Take 75 mg by mouth 3 (three) times daily with meals.    Yes [provider]  XIFAXAN 550 MG TABS tablet TAKE 1 TABLET BY MOUTH TWICE DAILY Patient taking differently: Take 550 mg by mouth 2 (two) times daily.  02/29/20  Yes Mahala Menghini, PA-C    Allergies as of 04/09/2020 - Review Complete 04/09/2020  Allergen Reaction Noted  . Chlorhexidine  09/19/2019  . Tape Itching and Rash 09/18/2019    Family History  Problem Relation Age of Onset  . Colon cancer Father 7       deceased age 68  . Breast cancer Sister        deceased  . Diabetes  Sister   . Stroke Mother   . Healthy Son   . Healthy Daughter   . Lung cancer Neg Hx   . Ovarian cancer Neg Hx     Social History   Socioeconomic History  . Marital status: Married    Spouse name: Not on file  . Number of children: 2  . Years of education: Not on file  . Highest education level: Not on file  Occupational History  . Occupation: RETIRED    Employer: SELF EMPLOYED  Tobacco Use  . Smoking status: Former Smoker    Packs/day: 2.00    Years: 25.00    Pack years: 50.00    Types: Cigarettes    Quit date: 01/28/1986    Years since quitting: 34.2  . Smokeless tobacco: Never Used  Vaping Use  . Vaping Use: Never used  Substance and Sexual Activity  . Alcohol use: No    Comment: quit in 05/2013  . Drug use: No  . Sexual activity: Not Currently  Other Topics Concern  . Not on file  Social History Narrative   Two step children from second marriage (divorced) who live with him along with some grandchildren.    Social Determinants of Health   Financial Resource Strain:   . Difficulty of Paying Living Expenses: Not on file  Food Insecurity: No Food Insecurity  . Worried About Charity fundraiser in the Last Year: Never true  . Ran Out of Food in the Last Year: Never true  Transportation Needs: No Transportation Needs  . Lack of Transportation (Medical): No  . Lack of Transportation (Non-Medical): No  Physical Activity:   . Days of Exercise per Week: Not on file  . Minutes of Exercise per Session: Not on file  Stress:   . Feeling of Stress : Not on file  Social Connections:   . Frequency of Communication with Friends and Family: Not on file  . Frequency of Social Gatherings with Friends and Family: Not on  file  . Attends Religious Services: Not on file  . Active Member of Clubs or Organizations: Not on file  . Attends Archivist Meetings: Not on file  . Marital Status: Not on file  Intimate Partner Violence:   . Fear of Current or Ex-Partner: Not  on file  . Emotionally Abused: Not on file  . Physically Abused: Not on file  . Sexually Abused: Not on file    Review of Systems: See HPI, otherwise negative ROS  Physical Exam: BP (!) 120/58   Pulse 96   Temp (!) 97 F (36.1 C)   Ht 5\' 7"  (1.702 m)   Wt 243 lb 9.6 oz (110.5 kg)   BMI 38.15 kg/m    Weight in this office back in September was 229 General:   Alert, disheveled, conversant.  Pleasant and cooperative in NAD.  Confined to a wheelchair.  Accompanied by his wife.  He has asterixis. Eyes:  Sclera clear, no icterus.   Conjunctiva pink. Neck:  Supple; no masses or thyromegaly. No significant cervical adenopathy. Lungs:  Clear throughout to auscultation.   No wheezes, crackles, or rhonchi. No acute distress. Heart:  Regular rate and rhythm; no murmurs, clicks, rubs,  or gallops. Abdomen: Obese/distended.  Moderate fluid wave soft and nontender Pulses:  Normal pulses noted. Extremities: 2+ lower extremity edema  Impression/Plan: 74 year old gentleman with decompensated alcohol-related cirrhosis along with end-stage renal disease on hemodialysis complicated by recent admission for SBP/pneumonia/sepsis.  He remains decompensated with encephalopathy and anasarca. Recent colonoscopy canceled.  He does have intermittent low-volume rectal bleeding.  However, his hemoglobin is stable.  GI bleeding appears not to be a major issue at this time. He is on Levaquin at renal doses.  He is not on a nonselective beta-blocker. Moreover, patient is not a candidate for any sort of elective endoscopic evaluation at this time.  Recommendations:  Stop cholestyramine  Stop omeprazole/Prilosec  Continue Tums 3 times a day as prescribed by nephrologist  Increase lactulose to 30 cc in the morning and 45 cc in the evening; goal is 3 semiformed bowel movements daily but not diarrhea. (Dispense 1 pint with 1 years refills.  Continue forever)  Continue Xifaxan 550 mg twice  daily-indefinitely  Continue Levaquin 500 mg 3 times a week after dialysis.  This is the best antibiotic choice to prevent future infections.  (Dispense 12 tablets with 11 refills).  Continue indefinitely.  Continue spironolactone and Lasix every day with no change in dosing.  Adherence to a 2 g sodium diet very important.  As discussed, patient is not fit for a colonoscopy.  The risk outweigh the benefits.  As long as no overt GI bleeding, further GI evaluation via endoscopy is not recommended.  Hemoglobin will be followed closely.  Unfortunately, patient's liver disease is progressing with significant complications including SBP.  Increase in short term mortality reviewed with patient and spouse.  Patient's condition is terminal and incurable.  Will obtain a hepatic function profile, BEMET, INR and CBC in 1 week  Office visit with [name] in 4 weeks.       Notice: This dictation was prepared with Dragon dictation along with smaller phrase technology. Any transcriptional errors that result from this process are unintentional and may not be corrected upon review.

## 2020-04-09 NOTE — Patient Instructions (Signed)
Stop cholestyramine  Stop omeprazole/Prilosec  Continue Tums 3 times a day as prescribed by your kidney doctor  Increase lactulose to 30 cc in the morning and 45 cc in the evening; goal is 3 semiformed bowel movements daily but not diarrhea. (Dispense 1 pint with 1 years refills.  Continue forever)  Continue Xifaxan 550 mg twice daily-indefinitely  Continue Levaquin 500 mg 3 times a week after dialysis.  This is the best antibiotic choice to prevent future infections.  (Dispense 12 tablets with 11 refills).  Continue indefinitely.  Continue spironolactone and Lasix every day with no change in dosing.  As discussed, you are not fit for a colonoscopy.  The risk outweigh the benefits.  As long as you do not have large volumes of rectal bleeding, no further evaluation indicated.  Hemoglobin will be followed closely.  Unfortunately, your liver disease is progressing and we are seeing more and more complications.  Hepatic function profile, be met, INR and CBC in 1 week  Office visit with [name] in 4 weeks.

## 2020-04-10 DIAGNOSIS — Z992 Dependence on renal dialysis: Secondary | ICD-10-CM | POA: Diagnosis not present

## 2020-04-10 DIAGNOSIS — N186 End stage renal disease: Secondary | ICD-10-CM | POA: Diagnosis not present

## 2020-04-11 DIAGNOSIS — I8510 Secondary esophageal varices without bleeding: Secondary | ICD-10-CM | POA: Diagnosis not present

## 2020-04-11 DIAGNOSIS — K7290 Hepatic failure, unspecified without coma: Secondary | ICD-10-CM | POA: Diagnosis not present

## 2020-04-11 DIAGNOSIS — J9622 Acute and chronic respiratory failure with hypercapnia: Secondary | ICD-10-CM | POA: Diagnosis not present

## 2020-04-11 DIAGNOSIS — N186 End stage renal disease: Secondary | ICD-10-CM | POA: Diagnosis not present

## 2020-04-11 DIAGNOSIS — Z992 Dependence on renal dialysis: Secondary | ICD-10-CM | POA: Diagnosis not present

## 2020-04-11 DIAGNOSIS — J9621 Acute and chronic respiratory failure with hypoxia: Secondary | ICD-10-CM | POA: Diagnosis not present

## 2020-04-11 DIAGNOSIS — L03115 Cellulitis of right lower limb: Secondary | ICD-10-CM | POA: Diagnosis not present

## 2020-04-11 DIAGNOSIS — I509 Heart failure, unspecified: Secondary | ICD-10-CM | POA: Diagnosis not present

## 2020-04-11 DIAGNOSIS — B954 Other streptococcus as the cause of diseases classified elsewhere: Secondary | ICD-10-CM | POA: Diagnosis not present

## 2020-04-11 DIAGNOSIS — Z515 Encounter for palliative care: Secondary | ICD-10-CM | POA: Diagnosis not present

## 2020-04-11 DIAGNOSIS — G9341 Metabolic encephalopathy: Secondary | ICD-10-CM | POA: Diagnosis not present

## 2020-04-11 DIAGNOSIS — I10 Essential (primary) hypertension: Secondary | ICD-10-CM | POA: Diagnosis not present

## 2020-04-11 DIAGNOSIS — E871 Hypo-osmolality and hyponatremia: Secondary | ICD-10-CM | POA: Diagnosis not present

## 2020-04-11 DIAGNOSIS — K652 Spontaneous bacterial peritonitis: Secondary | ICD-10-CM | POA: Diagnosis not present

## 2020-04-11 DIAGNOSIS — E1165 Type 2 diabetes mellitus with hyperglycemia: Secondary | ICD-10-CM | POA: Diagnosis not present

## 2020-04-11 DIAGNOSIS — Z299 Encounter for prophylactic measures, unspecified: Secondary | ICD-10-CM | POA: Diagnosis not present

## 2020-04-12 DIAGNOSIS — Z992 Dependence on renal dialysis: Secondary | ICD-10-CM | POA: Diagnosis not present

## 2020-04-12 DIAGNOSIS — N186 End stage renal disease: Secondary | ICD-10-CM | POA: Diagnosis not present

## 2020-04-15 DIAGNOSIS — E878 Other disorders of electrolyte and fluid balance, not elsewhere classified: Secondary | ICD-10-CM | POA: Diagnosis not present

## 2020-04-15 DIAGNOSIS — Z992 Dependence on renal dialysis: Secondary | ICD-10-CM | POA: Diagnosis not present

## 2020-04-15 DIAGNOSIS — N186 End stage renal disease: Secondary | ICD-10-CM | POA: Diagnosis not present

## 2020-04-15 NOTE — Progress Notes (Signed)
Pt is scheduled to have labs completed this week.

## 2020-04-16 ENCOUNTER — Ambulatory Visit (INDEPENDENT_AMBULATORY_CARE_PROVIDER_SITE_OTHER): Payer: Medicare Other

## 2020-04-16 ENCOUNTER — Other Ambulatory Visit: Payer: Self-pay

## 2020-04-16 DIAGNOSIS — E871 Hypo-osmolality and hyponatremia: Secondary | ICD-10-CM | POA: Diagnosis not present

## 2020-04-16 DIAGNOSIS — I8510 Secondary esophageal varices without bleeding: Secondary | ICD-10-CM | POA: Diagnosis not present

## 2020-04-16 DIAGNOSIS — G9341 Metabolic encephalopathy: Secondary | ICD-10-CM | POA: Diagnosis not present

## 2020-04-16 DIAGNOSIS — L03115 Cellulitis of right lower limb: Secondary | ICD-10-CM | POA: Diagnosis not present

## 2020-04-16 DIAGNOSIS — J9621 Acute and chronic respiratory failure with hypoxia: Secondary | ICD-10-CM | POA: Diagnosis not present

## 2020-04-16 DIAGNOSIS — K652 Spontaneous bacterial peritonitis: Secondary | ICD-10-CM | POA: Diagnosis not present

## 2020-04-16 DIAGNOSIS — B954 Other streptococcus as the cause of diseases classified elsewhere: Secondary | ICD-10-CM | POA: Diagnosis not present

## 2020-04-16 DIAGNOSIS — K7290 Hepatic failure, unspecified without coma: Secondary | ICD-10-CM | POA: Diagnosis not present

## 2020-04-16 DIAGNOSIS — R339 Retention of urine, unspecified: Secondary | ICD-10-CM | POA: Diagnosis not present

## 2020-04-16 DIAGNOSIS — J9622 Acute and chronic respiratory failure with hypercapnia: Secondary | ICD-10-CM | POA: Diagnosis not present

## 2020-04-16 NOTE — Progress Notes (Signed)
Cath Change/ Replacement  Patient is present today for a catheter change due to urinary retention.  43ml of water was removed from the balloon, a 16FR foley cath was removed with out difficulty.  Patient was cleaned and prepped in a sterile fashion with betadine. A 16 FR foley cath was replaced into the bladder no complications were noted Urine return was noted 84ml and urine was yellow in color. The balloon was filled with 76ml of sterile water. A leg bag was attached for drainage. Patient was given proper instruction on catheter care.    Performed by: Norvil Martensen, LPN  Follow up: Keep scheduled NV

## 2020-04-17 DIAGNOSIS — Z992 Dependence on renal dialysis: Secondary | ICD-10-CM | POA: Diagnosis not present

## 2020-04-17 DIAGNOSIS — N186 End stage renal disease: Secondary | ICD-10-CM | POA: Diagnosis not present

## 2020-04-18 DIAGNOSIS — B954 Other streptococcus as the cause of diseases classified elsewhere: Secondary | ICD-10-CM | POA: Diagnosis not present

## 2020-04-18 DIAGNOSIS — K652 Spontaneous bacterial peritonitis: Secondary | ICD-10-CM | POA: Diagnosis not present

## 2020-04-18 DIAGNOSIS — L03115 Cellulitis of right lower limb: Secondary | ICD-10-CM | POA: Diagnosis not present

## 2020-04-18 DIAGNOSIS — I8510 Secondary esophageal varices without bleeding: Secondary | ICD-10-CM | POA: Diagnosis not present

## 2020-04-18 DIAGNOSIS — G9341 Metabolic encephalopathy: Secondary | ICD-10-CM | POA: Diagnosis not present

## 2020-04-18 DIAGNOSIS — J9621 Acute and chronic respiratory failure with hypoxia: Secondary | ICD-10-CM | POA: Diagnosis not present

## 2020-04-18 DIAGNOSIS — E871 Hypo-osmolality and hyponatremia: Secondary | ICD-10-CM | POA: Diagnosis not present

## 2020-04-18 DIAGNOSIS — J9622 Acute and chronic respiratory failure with hypercapnia: Secondary | ICD-10-CM | POA: Diagnosis not present

## 2020-04-18 DIAGNOSIS — K7290 Hepatic failure, unspecified without coma: Secondary | ICD-10-CM | POA: Diagnosis not present

## 2020-04-19 DIAGNOSIS — N186 End stage renal disease: Secondary | ICD-10-CM | POA: Diagnosis not present

## 2020-04-19 DIAGNOSIS — Z992 Dependence on renal dialysis: Secondary | ICD-10-CM | POA: Diagnosis not present

## 2020-04-22 DIAGNOSIS — Z992 Dependence on renal dialysis: Secondary | ICD-10-CM | POA: Diagnosis not present

## 2020-04-22 DIAGNOSIS — N186 End stage renal disease: Secondary | ICD-10-CM | POA: Diagnosis not present

## 2020-04-23 ENCOUNTER — Other Ambulatory Visit: Payer: Self-pay | Admitting: *Deleted

## 2020-04-23 DIAGNOSIS — J9622 Acute and chronic respiratory failure with hypercapnia: Secondary | ICD-10-CM | POA: Diagnosis not present

## 2020-04-23 DIAGNOSIS — K652 Spontaneous bacterial peritonitis: Secondary | ICD-10-CM | POA: Diagnosis not present

## 2020-04-23 DIAGNOSIS — L03115 Cellulitis of right lower limb: Secondary | ICD-10-CM | POA: Diagnosis not present

## 2020-04-23 DIAGNOSIS — G9341 Metabolic encephalopathy: Secondary | ICD-10-CM | POA: Diagnosis not present

## 2020-04-23 DIAGNOSIS — J9621 Acute and chronic respiratory failure with hypoxia: Secondary | ICD-10-CM | POA: Diagnosis not present

## 2020-04-23 DIAGNOSIS — E871 Hypo-osmolality and hyponatremia: Secondary | ICD-10-CM | POA: Diagnosis not present

## 2020-04-23 DIAGNOSIS — B954 Other streptococcus as the cause of diseases classified elsewhere: Secondary | ICD-10-CM | POA: Diagnosis not present

## 2020-04-23 DIAGNOSIS — I8510 Secondary esophageal varices without bleeding: Secondary | ICD-10-CM | POA: Diagnosis not present

## 2020-04-23 DIAGNOSIS — K7290 Hepatic failure, unspecified without coma: Secondary | ICD-10-CM | POA: Diagnosis not present

## 2020-04-23 NOTE — Patient Outreach (Signed)
[place] [facility]) Care Management  04/23/2020  JACODY BENEKE 26-Dec-1945 [id number]   Telephone Assessment-Successful  RN spoke with pt today in detail concerning his ongoing management of care related to his diabetes. Pt reports his most recent CBG was 205 however in Oct his last A1c was 6.5 much improved. Pt aware he needs to reduce his carbohydrates and increase his protein however this is difficult due to his ongoing dialysis on M-W-F. States he has not had to go to the hospital lately for an iron infusions and feels "great". Due to his history of c-diff his recent colonoscopy was not completed. RN inquired about his ongoing constipation. Pt states much improved with ongong lactulose dosing. Continue to reports support from his "nurse" his wife who assist with the most of his needs. Pt continue to verified sufficient transportation to all his medical appointments and has a good supply on his ongoing medications with no needed refills.   Discussed his plan of care and dietary measures to further reduce his ongoing CBGs and reiterated on the risk involved if pt's daily blood glucose levels are not managed (pt verbalized understanding).  Plan of care updated with much discussion and RN will follow up next month based upon pt's ongoing adherence to the current plan of care. Will update pt's provided accordingly on his disposition with [facility] services.  Goals Addressed            This Visit's Progress   . THN-Make and Keep All Appointments   On track    Follow Up Date  05/31/2020   - call to cancel if needed - keep a calendar with prescription refill dates - keep a calendar with appointment dates    Why is this important?   Part of staying healthy is seeing the doctor for follow-up care.  If you forget your appointments, there are some things you can do to stay on track.    Notes: Adherence to all written above.     Ala Bent Constipation   On track    Follow Up Date  07/02/2019    - be active every day - call the doctor or nurse if constipation doesn't get better - eat whole fruit instead of juice - increase the amount of dietary fiber in food - take a stool softener    Why is this important?   Eating foods that are high in iron can lead to hard, dry stool.  Taking iron pills or vitamins can also cause this to happen.  Making a few simple changes can help.    Notes: Dietary measures and limitation discussed.       Raina Mina, RN Care Management Coordinator [place] Office [phone]

## 2020-04-24 DIAGNOSIS — Z992 Dependence on renal dialysis: Secondary | ICD-10-CM | POA: Diagnosis not present

## 2020-04-24 DIAGNOSIS — J9621 Acute and chronic respiratory failure with hypoxia: Secondary | ICD-10-CM | POA: Diagnosis not present

## 2020-04-24 DIAGNOSIS — N186 End stage renal disease: Secondary | ICD-10-CM | POA: Diagnosis not present

## 2020-04-26 DIAGNOSIS — J9621 Acute and chronic respiratory failure with hypoxia: Secondary | ICD-10-CM | POA: Diagnosis not present

## 2020-04-26 DIAGNOSIS — K7290 Hepatic failure, unspecified without coma: Secondary | ICD-10-CM | POA: Diagnosis not present

## 2020-04-26 DIAGNOSIS — B954 Other streptococcus as the cause of diseases classified elsewhere: Secondary | ICD-10-CM | POA: Diagnosis not present

## 2020-04-26 DIAGNOSIS — K652 Spontaneous bacterial peritonitis: Secondary | ICD-10-CM | POA: Diagnosis not present

## 2020-04-26 DIAGNOSIS — L03115 Cellulitis of right lower limb: Secondary | ICD-10-CM | POA: Diagnosis not present

## 2020-04-26 DIAGNOSIS — E871 Hypo-osmolality and hyponatremia: Secondary | ICD-10-CM | POA: Diagnosis not present

## 2020-04-26 DIAGNOSIS — N186 End stage renal disease: Secondary | ICD-10-CM | POA: Diagnosis not present

## 2020-04-26 DIAGNOSIS — I8510 Secondary esophageal varices without bleeding: Secondary | ICD-10-CM | POA: Diagnosis not present

## 2020-04-26 DIAGNOSIS — G9341 Metabolic encephalopathy: Secondary | ICD-10-CM | POA: Diagnosis not present

## 2020-04-26 DIAGNOSIS — J9622 Acute and chronic respiratory failure with hypercapnia: Secondary | ICD-10-CM | POA: Diagnosis not present

## 2020-04-26 DIAGNOSIS — Z992 Dependence on renal dialysis: Secondary | ICD-10-CM | POA: Diagnosis not present

## 2020-04-27 DIAGNOSIS — R0602 Shortness of breath: Secondary | ICD-10-CM | POA: Diagnosis not present

## 2020-04-27 DIAGNOSIS — I509 Heart failure, unspecified: Secondary | ICD-10-CM | POA: Diagnosis not present

## 2020-04-29 DIAGNOSIS — Z992 Dependence on renal dialysis: Secondary | ICD-10-CM | POA: Diagnosis not present

## 2020-04-29 DIAGNOSIS — N186 End stage renal disease: Secondary | ICD-10-CM | POA: Diagnosis not present

## 2020-04-30 DIAGNOSIS — Z992 Dependence on renal dialysis: Secondary | ICD-10-CM | POA: Diagnosis not present

## 2020-04-30 DIAGNOSIS — G9341 Metabolic encephalopathy: Secondary | ICD-10-CM | POA: Diagnosis not present

## 2020-04-30 DIAGNOSIS — B954 Other streptococcus as the cause of diseases classified elsewhere: Secondary | ICD-10-CM | POA: Diagnosis not present

## 2020-04-30 DIAGNOSIS — K7290 Hepatic failure, unspecified without coma: Secondary | ICD-10-CM | POA: Diagnosis not present

## 2020-04-30 DIAGNOSIS — N186 End stage renal disease: Secondary | ICD-10-CM | POA: Diagnosis not present

## 2020-04-30 DIAGNOSIS — J9621 Acute and chronic respiratory failure with hypoxia: Secondary | ICD-10-CM | POA: Diagnosis not present

## 2020-04-30 DIAGNOSIS — E871 Hypo-osmolality and hyponatremia: Secondary | ICD-10-CM | POA: Diagnosis not present

## 2020-04-30 DIAGNOSIS — I8510 Secondary esophageal varices without bleeding: Secondary | ICD-10-CM | POA: Diagnosis not present

## 2020-04-30 DIAGNOSIS — J9622 Acute and chronic respiratory failure with hypercapnia: Secondary | ICD-10-CM | POA: Diagnosis not present

## 2020-04-30 DIAGNOSIS — K652 Spontaneous bacterial peritonitis: Secondary | ICD-10-CM | POA: Diagnosis not present

## 2020-04-30 DIAGNOSIS — L03115 Cellulitis of right lower limb: Secondary | ICD-10-CM | POA: Diagnosis not present

## 2020-05-01 DIAGNOSIS — N186 End stage renal disease: Secondary | ICD-10-CM | POA: Diagnosis not present

## 2020-05-01 DIAGNOSIS — Z992 Dependence on renal dialysis: Secondary | ICD-10-CM | POA: Diagnosis not present

## 2020-05-01 DIAGNOSIS — E878 Other disorders of electrolyte and fluid balance, not elsewhere classified: Secondary | ICD-10-CM | POA: Diagnosis not present

## 2020-05-02 DIAGNOSIS — M25511 Pain in right shoulder: Secondary | ICD-10-CM | POA: Diagnosis not present

## 2020-05-02 DIAGNOSIS — L03115 Cellulitis of right lower limb: Secondary | ICD-10-CM | POA: Diagnosis not present

## 2020-05-02 DIAGNOSIS — J9621 Acute and chronic respiratory failure with hypoxia: Secondary | ICD-10-CM | POA: Diagnosis not present

## 2020-05-02 DIAGNOSIS — E871 Hypo-osmolality and hyponatremia: Secondary | ICD-10-CM | POA: Diagnosis not present

## 2020-05-02 DIAGNOSIS — I509 Heart failure, unspecified: Secondary | ICD-10-CM | POA: Diagnosis not present

## 2020-05-02 DIAGNOSIS — K7290 Hepatic failure, unspecified without coma: Secondary | ICD-10-CM | POA: Diagnosis not present

## 2020-05-02 DIAGNOSIS — B954 Other streptococcus as the cause of diseases classified elsewhere: Secondary | ICD-10-CM | POA: Diagnosis not present

## 2020-05-02 DIAGNOSIS — J9622 Acute and chronic respiratory failure with hypercapnia: Secondary | ICD-10-CM | POA: Diagnosis not present

## 2020-05-02 DIAGNOSIS — I8510 Secondary esophageal varices without bleeding: Secondary | ICD-10-CM | POA: Diagnosis not present

## 2020-05-02 DIAGNOSIS — G9341 Metabolic encephalopathy: Secondary | ICD-10-CM | POA: Diagnosis not present

## 2020-05-02 DIAGNOSIS — K7460 Unspecified cirrhosis of liver: Secondary | ICD-10-CM | POA: Diagnosis not present

## 2020-05-02 DIAGNOSIS — K652 Spontaneous bacterial peritonitis: Secondary | ICD-10-CM | POA: Diagnosis not present

## 2020-05-02 DIAGNOSIS — I10 Essential (primary) hypertension: Secondary | ICD-10-CM | POA: Diagnosis not present

## 2020-05-02 DIAGNOSIS — Z299 Encounter for prophylactic measures, unspecified: Secondary | ICD-10-CM | POA: Diagnosis not present

## 2020-05-03 ENCOUNTER — Telehealth: Payer: Self-pay | Admitting: Internal Medicine

## 2020-05-03 DIAGNOSIS — Z992 Dependence on renal dialysis: Secondary | ICD-10-CM | POA: Diagnosis not present

## 2020-05-03 DIAGNOSIS — N186 End stage renal disease: Secondary | ICD-10-CM | POA: Diagnosis not present

## 2020-05-03 DIAGNOSIS — I509 Heart failure, unspecified: Secondary | ICD-10-CM | POA: Diagnosis not present

## 2020-05-03 NOTE — Telephone Encounter (Signed)
Communication noted.

## 2020-05-03 NOTE — Telephone Encounter (Signed)
Spoke with pts spouse. Pt is having some swelling, no shortness of breath. Pt is currently at dialysis and goes Mondays, Wednesday or Friday. Pts spouse would like to arrange a para on a Tuesday or Thursday. Pts last para was 04/02/20.  Spouse is aware that if pt has any significant changes in his health, shortness of breath ext,  pt needs to go to the ED. Spouse agreed to recommendations.

## 2020-05-03 NOTE — Telephone Encounter (Signed)
Patient wife called and said that patient has a lot of fluid buildup and possibly needs a para

## 2020-05-06 DIAGNOSIS — E878 Other disorders of electrolyte and fluid balance, not elsewhere classified: Secondary | ICD-10-CM | POA: Diagnosis not present

## 2020-05-06 DIAGNOSIS — Z992 Dependence on renal dialysis: Secondary | ICD-10-CM | POA: Diagnosis not present

## 2020-05-06 DIAGNOSIS — N186 End stage renal disease: Secondary | ICD-10-CM | POA: Diagnosis not present

## 2020-05-07 ENCOUNTER — Other Ambulatory Visit: Payer: Self-pay | Admitting: *Deleted

## 2020-05-07 DIAGNOSIS — B954 Other streptococcus as the cause of diseases classified elsewhere: Secondary | ICD-10-CM | POA: Diagnosis not present

## 2020-05-07 DIAGNOSIS — J9622 Acute and chronic respiratory failure with hypercapnia: Secondary | ICD-10-CM | POA: Diagnosis not present

## 2020-05-07 DIAGNOSIS — J9621 Acute and chronic respiratory failure with hypoxia: Secondary | ICD-10-CM | POA: Diagnosis not present

## 2020-05-07 DIAGNOSIS — I5032 Chronic diastolic (congestive) heart failure: Secondary | ICD-10-CM | POA: Diagnosis not present

## 2020-05-07 DIAGNOSIS — R188 Other ascites: Secondary | ICD-10-CM

## 2020-05-07 DIAGNOSIS — K7290 Hepatic failure, unspecified without coma: Secondary | ICD-10-CM | POA: Diagnosis not present

## 2020-05-07 DIAGNOSIS — G9341 Metabolic encephalopathy: Secondary | ICD-10-CM | POA: Diagnosis not present

## 2020-05-07 DIAGNOSIS — N186 End stage renal disease: Secondary | ICD-10-CM | POA: Diagnosis not present

## 2020-05-07 DIAGNOSIS — I8510 Secondary esophageal varices without bleeding: Secondary | ICD-10-CM | POA: Diagnosis not present

## 2020-05-07 DIAGNOSIS — E871 Hypo-osmolality and hyponatremia: Secondary | ICD-10-CM | POA: Diagnosis not present

## 2020-05-07 DIAGNOSIS — Z992 Dependence on renal dialysis: Secondary | ICD-10-CM | POA: Diagnosis not present

## 2020-05-07 DIAGNOSIS — Z515 Encounter for palliative care: Secondary | ICD-10-CM | POA: Diagnosis not present

## 2020-05-07 DIAGNOSIS — K652 Spontaneous bacterial peritonitis: Secondary | ICD-10-CM | POA: Diagnosis not present

## 2020-05-07 DIAGNOSIS — L03115 Cellulitis of right lower limb: Secondary | ICD-10-CM | POA: Diagnosis not present

## 2020-05-07 NOTE — Telephone Encounter (Signed)
Dr. Gala Romney, is it ok for a para to be arranged for pt?

## 2020-05-07 NOTE — Telephone Encounter (Signed)
Yes, please.

## 2020-05-07 NOTE — Telephone Encounter (Signed)
Cell count with differential and anaerobic/aerobic culture of fluid  If they get more than 4 L out, give patient 25 g of albumin IV.  If less than 4 L , no albumin.  Thanks.

## 2020-05-07 NOTE — Telephone Encounter (Signed)
Dr. Gala Romney, Does patient need albumin? And any labs for the para? Thanks!

## 2020-05-07 NOTE — Telephone Encounter (Signed)
Noted. Left a message for pts spouse letting her know that para would be arranged. Please see the days that pt has dialysis. Pt can do paras on Tuesdays or Thursdays.

## 2020-05-07 NOTE — Telephone Encounter (Signed)
Noted  

## 2020-05-07 NOTE — Telephone Encounter (Signed)
Called spouse. Aware Para scheduled for 12/9 at 10:00am, arrival 9:45am. Nothing further needed

## 2020-05-08 DIAGNOSIS — Z992 Dependence on renal dialysis: Secondary | ICD-10-CM | POA: Diagnosis not present

## 2020-05-08 DIAGNOSIS — Z794 Long term (current) use of insulin: Secondary | ICD-10-CM | POA: Diagnosis not present

## 2020-05-08 DIAGNOSIS — N186 End stage renal disease: Secondary | ICD-10-CM | POA: Diagnosis not present

## 2020-05-08 DIAGNOSIS — E1165 Type 2 diabetes mellitus with hyperglycemia: Secondary | ICD-10-CM | POA: Diagnosis not present

## 2020-05-09 ENCOUNTER — Ambulatory Visit (HOSPITAL_COMMUNITY)
Admission: RE | Admit: 2020-05-09 | Discharge: 2020-05-09 | Disposition: A | Discharge status: Home / Self Care | Payer: Medicare Other | Source: Ambulatory Visit | Attending: Internal Medicine | Admitting: Internal Medicine

## 2020-05-09 ENCOUNTER — Other Ambulatory Visit: Payer: Self-pay

## 2020-05-09 ENCOUNTER — Encounter (HOSPITAL_COMMUNITY): Payer: Self-pay

## 2020-05-09 DIAGNOSIS — K652 Spontaneous bacterial peritonitis: Secondary | ICD-10-CM | POA: Diagnosis not present

## 2020-05-09 DIAGNOSIS — R188 Other ascites: Secondary | ICD-10-CM | POA: Diagnosis not present

## 2020-05-09 DIAGNOSIS — I8510 Secondary esophageal varices without bleeding: Secondary | ICD-10-CM | POA: Diagnosis not present

## 2020-05-09 DIAGNOSIS — K7290 Hepatic failure, unspecified without coma: Secondary | ICD-10-CM | POA: Diagnosis not present

## 2020-05-09 DIAGNOSIS — J9621 Acute and chronic respiratory failure with hypoxia: Secondary | ICD-10-CM | POA: Diagnosis not present

## 2020-05-09 DIAGNOSIS — G9341 Metabolic encephalopathy: Secondary | ICD-10-CM | POA: Diagnosis not present

## 2020-05-09 DIAGNOSIS — L03115 Cellulitis of right lower limb: Secondary | ICD-10-CM | POA: Diagnosis not present

## 2020-05-09 DIAGNOSIS — J9622 Acute and chronic respiratory failure with hypercapnia: Secondary | ICD-10-CM | POA: Diagnosis not present

## 2020-05-09 DIAGNOSIS — B954 Other streptococcus as the cause of diseases classified elsewhere: Secondary | ICD-10-CM | POA: Diagnosis not present

## 2020-05-09 DIAGNOSIS — E871 Hypo-osmolality and hyponatremia: Secondary | ICD-10-CM | POA: Diagnosis not present

## 2020-05-09 LAB — GRAM STAIN: Gram Stain: NONE SEEN

## 2020-05-09 LAB — BODY FLUID CELL COUNT WITH DIFFERENTIAL
Lymphs, Fluid: 71 %
Monocyte-Macrophage-Serous Fluid: 25 % — ABNORMAL LOW (ref 50–90)
Neutrophil Count, Fluid: 4 % (ref 0–25)
Other Cells, Fluid: ABNORMAL %
Total Nucleated Cell Count, Fluid: 116 cu mm (ref 0–1000)

## 2020-05-09 MED ORDER — ALBUMIN HUMAN 25 % IV SOLN
0.0000 g | Freq: Once | INTRAVENOUS | Status: AC
  Filled 2020-05-09: qty 400

## 2020-05-09 MED ORDER — ALBUMIN HUMAN 25 % IV SOLN
INTRAVENOUS | Status: AC
  Administered 2020-05-09: 25 g via INTRAVENOUS
  Filled 2020-05-09: qty 200

## 2020-05-09 NOTE — Procedures (Signed)
  Recurrent ascites  LLQ US guided paracentesis  4.7 L cloudy yellow fluid Sent for labs per MD  Tolerated well EBL: less than 1 cc

## 2020-05-09 NOTE — Discharge Instructions (Signed)
Paracentesis, Care After °This sheet gives you information about how to care for yourself after your procedure. Your health care provider may also give you more specific instructions. If you have problems or questions, contact your health care provider. °What can I expect after the procedure? °After the procedure, it is common to have a small amount of clear fluid coming from the puncture site. °Follow these instructions at home: °Puncture site care ° °· Follow instructions from your health care provider about how to take care of your puncture site. Make sure you: °? Wash your hands with soap and water before and after you change your bandage (dressing). If soap and water are not available, use hand sanitizer. °? Change your dressing as told by your health care provider. °· Check your puncture area every day for signs of infection. Check for: °? Redness, swelling, or pain. °? More fluid or blood. °? Warmth. °? Pus or a bad smell. °General instructions °· Return to your normal activities as told by your health care provider. Ask your health care provider what activities are safe for you. °· Take over-the-counter and prescription medicines only as told by your health care provider. °· Do not take baths, swim, or use a hot tub until your health care provider approves. Ask your health care provider if you may take showers. You may only be allowed to take sponge baths. °· Keep all follow-up visits as told by your health care provider. This is important. °Contact a health care provider if: °· You have redness, swelling, or pain at your puncture site. °· You have more fluid or blood coming from your puncture site. °· Your puncture site feels warm to the touch. °· You have pus or a bad smell coming from your puncture site. °· You have a fever. °Get help right away if: °· You have chest pain or shortness of breath. °· You develop increasing pain, discomfort, or swelling in your abdomen. °· You feel dizzy or light-headed or  you faint. °Summary °· After the procedure, it is common to have a small amount of clear fluid coming from the puncture site. °· Follow instructions from your health care provider about how to take care of your puncture site. °· Check your puncture area every day signs of infection. °· Keep all follow-up visits as told by your health care provider. °This information is not intended to replace advice given to you by your health care provider. Make sure you discuss any questions you have with your health care provider. °Document Revised: 11/29/2018 Document Reviewed: 03/08/2018 °Elsevier Patient Education © 2020 [employer] ° °

## 2020-05-09 NOTE — Sedation Documentation (Signed)
PT tolerated left sided paracentesis well today and 4.7 dark yellow cloudy fluid removed with labs collected and sent for processing. PT tolerated IV start and 25 G of albumin administered. PT verbalized understanding of discharge instructions and left via wheelchair with family.

## 2020-05-10 DIAGNOSIS — Z992 Dependence on renal dialysis: Secondary | ICD-10-CM | POA: Diagnosis not present

## 2020-05-10 DIAGNOSIS — N186 End stage renal disease: Secondary | ICD-10-CM | POA: Diagnosis not present

## 2020-05-10 LAB — PATHOLOGIST SMEAR REVIEW

## 2020-05-13 DIAGNOSIS — E878 Other disorders of electrolyte and fluid balance, not elsewhere classified: Secondary | ICD-10-CM | POA: Diagnosis not present

## 2020-05-13 DIAGNOSIS — N2581 Secondary hyperparathyroidism of renal origin: Secondary | ICD-10-CM | POA: Diagnosis not present

## 2020-05-13 DIAGNOSIS — Z992 Dependence on renal dialysis: Secondary | ICD-10-CM | POA: Diagnosis not present

## 2020-05-13 DIAGNOSIS — N186 End stage renal disease: Secondary | ICD-10-CM | POA: Diagnosis not present

## 2020-05-14 ENCOUNTER — Other Ambulatory Visit: Payer: Self-pay

## 2020-05-14 ENCOUNTER — Ambulatory Visit (INDEPENDENT_AMBULATORY_CARE_PROVIDER_SITE_OTHER): Payer: Medicare Other

## 2020-05-14 DIAGNOSIS — J9621 Acute and chronic respiratory failure with hypoxia: Secondary | ICD-10-CM | POA: Diagnosis not present

## 2020-05-14 DIAGNOSIS — J9622 Acute and chronic respiratory failure with hypercapnia: Secondary | ICD-10-CM | POA: Diagnosis not present

## 2020-05-14 DIAGNOSIS — I8510 Secondary esophageal varices without bleeding: Secondary | ICD-10-CM | POA: Diagnosis not present

## 2020-05-14 DIAGNOSIS — K652 Spontaneous bacterial peritonitis: Secondary | ICD-10-CM | POA: Diagnosis not present

## 2020-05-14 DIAGNOSIS — L03115 Cellulitis of right lower limb: Secondary | ICD-10-CM | POA: Diagnosis not present

## 2020-05-14 DIAGNOSIS — R339 Retention of urine, unspecified: Secondary | ICD-10-CM | POA: Diagnosis not present

## 2020-05-14 DIAGNOSIS — E871 Hypo-osmolality and hyponatremia: Secondary | ICD-10-CM | POA: Diagnosis not present

## 2020-05-14 DIAGNOSIS — G9341 Metabolic encephalopathy: Secondary | ICD-10-CM | POA: Diagnosis not present

## 2020-05-14 DIAGNOSIS — K7290 Hepatic failure, unspecified without coma: Secondary | ICD-10-CM | POA: Diagnosis not present

## 2020-05-14 DIAGNOSIS — B954 Other streptococcus as the cause of diseases classified elsewhere: Secondary | ICD-10-CM | POA: Diagnosis not present

## 2020-05-14 NOTE — Progress Notes (Signed)
Cath Change/ Replacement  Patient is present today for a catheter change due to urinary retention.  19ml of water was removed from the balloon, a 16FR foley cath was removed with out difficulty.  Patient was cleaned and prepped in a sterile fashion with betadine. A 16 FR foley cath was replaced into the bladder no complications were noted Urine return was noted 54ml and urine was yellow in color. The balloon was filled with 94ml of sterile water. A leg bag was attached for drainage.  A night bag was also given to the patient and patient was given instruction on how to change from one bag to another. Patient was given proper instruction on catheter care.    Performed by: Boston Catarino,lpn  Follow up: keep next scheduled [place]

## 2020-05-15 DIAGNOSIS — N186 End stage renal disease: Secondary | ICD-10-CM | POA: Diagnosis not present

## 2020-05-15 DIAGNOSIS — Z992 Dependence on renal dialysis: Secondary | ICD-10-CM | POA: Diagnosis not present

## 2020-05-16 DIAGNOSIS — E871 Hypo-osmolality and hyponatremia: Secondary | ICD-10-CM | POA: Diagnosis not present

## 2020-05-16 DIAGNOSIS — L03115 Cellulitis of right lower limb: Secondary | ICD-10-CM | POA: Diagnosis not present

## 2020-05-16 DIAGNOSIS — G9341 Metabolic encephalopathy: Secondary | ICD-10-CM | POA: Diagnosis not present

## 2020-05-16 DIAGNOSIS — J9622 Acute and chronic respiratory failure with hypercapnia: Secondary | ICD-10-CM | POA: Diagnosis not present

## 2020-05-16 DIAGNOSIS — K652 Spontaneous bacterial peritonitis: Secondary | ICD-10-CM | POA: Diagnosis not present

## 2020-05-16 DIAGNOSIS — B954 Other streptococcus as the cause of diseases classified elsewhere: Secondary | ICD-10-CM | POA: Diagnosis not present

## 2020-05-16 DIAGNOSIS — I8510 Secondary esophageal varices without bleeding: Secondary | ICD-10-CM | POA: Diagnosis not present

## 2020-05-16 DIAGNOSIS — J9621 Acute and chronic respiratory failure with hypoxia: Secondary | ICD-10-CM | POA: Diagnosis not present

## 2020-05-16 DIAGNOSIS — K7290 Hepatic failure, unspecified without coma: Secondary | ICD-10-CM | POA: Diagnosis not present

## 2020-05-16 LAB — CULTURE, BODY FLUID W GRAM STAIN -BOTTLE: Culture: NO GROWTH

## 2020-05-17 DIAGNOSIS — N186 End stage renal disease: Secondary | ICD-10-CM | POA: Diagnosis not present

## 2020-05-17 DIAGNOSIS — Z992 Dependence on renal dialysis: Secondary | ICD-10-CM | POA: Diagnosis not present

## 2020-05-19 ENCOUNTER — Other Ambulatory Visit: Payer: Self-pay | Admitting: Vascular Surgery

## 2020-05-20 DIAGNOSIS — N186 End stage renal disease: Secondary | ICD-10-CM | POA: Diagnosis not present

## 2020-05-20 DIAGNOSIS — Z992 Dependence on renal dialysis: Secondary | ICD-10-CM | POA: Diagnosis not present

## 2020-05-20 DIAGNOSIS — E878 Other disorders of electrolyte and fluid balance, not elsewhere classified: Secondary | ICD-10-CM | POA: Diagnosis not present

## 2020-05-21 DIAGNOSIS — J9622 Acute and chronic respiratory failure with hypercapnia: Secondary | ICD-10-CM | POA: Diagnosis not present

## 2020-05-21 DIAGNOSIS — K652 Spontaneous bacterial peritonitis: Secondary | ICD-10-CM | POA: Diagnosis not present

## 2020-05-21 DIAGNOSIS — L03115 Cellulitis of right lower limb: Secondary | ICD-10-CM | POA: Diagnosis not present

## 2020-05-21 DIAGNOSIS — I8510 Secondary esophageal varices without bleeding: Secondary | ICD-10-CM | POA: Diagnosis not present

## 2020-05-21 DIAGNOSIS — J9621 Acute and chronic respiratory failure with hypoxia: Secondary | ICD-10-CM | POA: Diagnosis not present

## 2020-05-21 DIAGNOSIS — B954 Other streptococcus as the cause of diseases classified elsewhere: Secondary | ICD-10-CM | POA: Diagnosis not present

## 2020-05-21 DIAGNOSIS — K7290 Hepatic failure, unspecified without coma: Secondary | ICD-10-CM | POA: Diagnosis not present

## 2020-05-21 DIAGNOSIS — G9341 Metabolic encephalopathy: Secondary | ICD-10-CM | POA: Diagnosis not present

## 2020-05-21 DIAGNOSIS — E871 Hypo-osmolality and hyponatremia: Secondary | ICD-10-CM | POA: Diagnosis not present

## 2020-05-22 ENCOUNTER — Ambulatory Visit: Payer: Medicare Other | Admitting: Gastroenterology

## 2020-05-22 DIAGNOSIS — Z992 Dependence on renal dialysis: Secondary | ICD-10-CM | POA: Diagnosis not present

## 2020-05-22 DIAGNOSIS — N186 End stage renal disease: Secondary | ICD-10-CM | POA: Diagnosis not present

## 2020-05-23 ENCOUNTER — Other Ambulatory Visit: Payer: Self-pay | Admitting: *Deleted

## 2020-05-23 NOTE — Patient Outreach (Addendum)
[facility]) Care Management  05/23/2020  Jeffrey [name] 09-Sep-1945 [id number]   Telephone Assessment-Successful-Diabetes  RN spoke with pt's spouse Jeffrey [name]) who verified pt continue to be on track with his ongoing management of care. States pt continue with all prescribed medication and attendance to all medical and scheduled appointments including dialysis. Spouse reports recent CBG ranges around 125 with most recent blood pressures at 120/60 even with the following report of several incidence however all resolved with no residual symptoms. One with a fall last week (skin tear) and another at the dialysis center where pt had a bleed after dialysis from the site. Again no residual issues as pt recovered well with no additional issues. Pending upcoming appointment at [facility] for [facility] 05/30/2020. Pt was received iron infusion at one point and his labs continue to be monitored.  Review and discussed the current plan of care in place and verified pt remains on track with no additional tools or resources needed at this time. Will update accordingly and continue communication with this provider quarterly per workflow.    Goals Addressed            This Visit's Progress   . [facility]-Make and Keep All Appointments   On track    Follow Up Date : 06/20/2020 Timeframe:  Long-Range Goal Priority:  Medium Start Date:     05/23/2020                        Expected End Date:        07/29/2020                 - call to cancel if needed - keep a calendar with prescription refill dates - keep a calendar with appointment dates    Why is this important?   Part of staying healthy is seeing the doctor for follow-up care.  If you forget your appointments, there are some things you can do to stay on track.    Notes: Verified pt continue to remain adherent with all dialysis and medical appointments scheduled. Will extend to allow ongoing adherence.    Jeffrey [name] Constipation    On track    Follow Up Date 06/20/2020 Timeframe:  Short-Term Goal Priority:  Medium Start Date:       05/23/2020                      Expected End Date: 07/01/2020                        - be active every day - call the doctor or nurse if constipation doesn't get better - eat whole fruit instead of juice - increase the amount of dietary fiber in food - take a stool softener    Why is this important?   Eating foods that are high in iron can lead to hard, dry stool.  Taking iron pills or vitamins can also cause this to happen.  Making a few simple changes can help.    Notes: Will extend and continue to encouraged adherence with his medication Lactulose.       Jeffrey Mina, RN Care Management Coordinator Center Office [phone]

## 2020-05-24 DIAGNOSIS — Z992 Dependence on renal dialysis: Secondary | ICD-10-CM | POA: Diagnosis not present

## 2020-05-24 DIAGNOSIS — N186 End stage renal disease: Secondary | ICD-10-CM | POA: Diagnosis not present

## 2020-05-27 DIAGNOSIS — R0602 Shortness of breath: Secondary | ICD-10-CM | POA: Diagnosis not present

## 2020-05-27 DIAGNOSIS — N186 End stage renal disease: Secondary | ICD-10-CM | POA: Diagnosis not present

## 2020-05-27 DIAGNOSIS — E119 Type 2 diabetes mellitus without complications: Secondary | ICD-10-CM | POA: Diagnosis not present

## 2020-05-27 DIAGNOSIS — Z992 Dependence on renal dialysis: Secondary | ICD-10-CM | POA: Diagnosis not present

## 2020-05-27 DIAGNOSIS — I509 Heart failure, unspecified: Secondary | ICD-10-CM | POA: Diagnosis not present

## 2020-05-27 DIAGNOSIS — Z794 Long term (current) use of insulin: Secondary | ICD-10-CM | POA: Diagnosis not present

## 2020-05-27 DIAGNOSIS — D509 Iron deficiency anemia, unspecified: Secondary | ICD-10-CM | POA: Diagnosis not present

## 2020-05-29 ENCOUNTER — Other Ambulatory Visit (HOSPITAL_COMMUNITY): Payer: Self-pay | Admitting: *Deleted

## 2020-05-29 DIAGNOSIS — Z992 Dependence on renal dialysis: Secondary | ICD-10-CM | POA: Diagnosis not present

## 2020-05-29 DIAGNOSIS — D500 Iron deficiency anemia secondary to blood loss (chronic): Secondary | ICD-10-CM

## 2020-05-29 DIAGNOSIS — D61818 Other pancytopenia: Secondary | ICD-10-CM

## 2020-05-29 DIAGNOSIS — N186 End stage renal disease: Secondary | ICD-10-CM | POA: Diagnosis not present

## 2020-05-29 DIAGNOSIS — D649 Anemia, unspecified: Secondary | ICD-10-CM

## 2020-05-30 ENCOUNTER — Inpatient Hospital Stay (HOSPITAL_COMMUNITY): Payer: Medicare Other | Attending: Hematology

## 2020-05-30 ENCOUNTER — Inpatient Hospital Stay (HOSPITAL_COMMUNITY): Payer: Medicare Other | Admitting: Oncology

## 2020-05-30 ENCOUNTER — Telehealth: Payer: Self-pay

## 2020-05-30 ENCOUNTER — Other Ambulatory Visit: Payer: Self-pay

## 2020-05-30 VITALS — BP 105/46 | HR 78 | Temp 97.1°F | Resp 17

## 2020-05-30 DIAGNOSIS — I10 Essential (primary) hypertension: Secondary | ICD-10-CM | POA: Diagnosis not present

## 2020-05-30 DIAGNOSIS — E538 Deficiency of other specified B group vitamins: Secondary | ICD-10-CM | POA: Insufficient documentation

## 2020-05-30 DIAGNOSIS — Z803 Family history of malignant neoplasm of breast: Secondary | ICD-10-CM | POA: Diagnosis not present

## 2020-05-30 DIAGNOSIS — D649 Anemia, unspecified: Secondary | ICD-10-CM | POA: Diagnosis not present

## 2020-05-30 DIAGNOSIS — K7210 Chronic hepatic failure without coma: Secondary | ICD-10-CM | POA: Insufficient documentation

## 2020-05-30 DIAGNOSIS — N184 Chronic kidney disease, stage 4 (severe): Secondary | ICD-10-CM | POA: Insufficient documentation

## 2020-05-30 DIAGNOSIS — Z992 Dependence on renal dialysis: Secondary | ICD-10-CM | POA: Insufficient documentation

## 2020-05-30 DIAGNOSIS — D631 Anemia in chronic kidney disease: Secondary | ICD-10-CM | POA: Insufficient documentation

## 2020-05-30 DIAGNOSIS — R188 Other ascites: Secondary | ICD-10-CM | POA: Diagnosis not present

## 2020-05-30 DIAGNOSIS — Z79899 Other long term (current) drug therapy: Secondary | ICD-10-CM | POA: Insufficient documentation

## 2020-05-30 DIAGNOSIS — D61818 Other pancytopenia: Secondary | ICD-10-CM | POA: Diagnosis not present

## 2020-05-30 DIAGNOSIS — D500 Iron deficiency anemia secondary to blood loss (chronic): Secondary | ICD-10-CM

## 2020-05-30 DIAGNOSIS — E119 Type 2 diabetes mellitus without complications: Secondary | ICD-10-CM | POA: Diagnosis not present

## 2020-05-30 DIAGNOSIS — K922 Gastrointestinal hemorrhage, unspecified: Secondary | ICD-10-CM | POA: Insufficient documentation

## 2020-05-30 DIAGNOSIS — Z87891 Personal history of nicotine dependence: Secondary | ICD-10-CM | POA: Insufficient documentation

## 2020-05-30 DIAGNOSIS — I5022 Chronic systolic (congestive) heart failure: Secondary | ICD-10-CM | POA: Diagnosis not present

## 2020-05-30 DIAGNOSIS — Z800 Family history of malignant neoplasm of digestive organs: Secondary | ICD-10-CM | POA: Insufficient documentation

## 2020-05-30 LAB — CBC WITH DIFFERENTIAL/PLATELET
Abs Immature Granulocytes: 0 10*3/uL (ref 0.00–0.07)
Basophils Absolute: 0 10*3/uL (ref 0.0–0.1)
Basophils Relative: 1 %
Eosinophils Absolute: 0.1 10*3/uL (ref 0.0–0.5)
Eosinophils Relative: 4 %
HCT: 27.1 % — ABNORMAL LOW (ref 39.0–52.0)
Hemoglobin: 8.2 g/dL — ABNORMAL LOW (ref 13.0–17.0)
Immature Granulocytes: 0 %
Lymphocytes Relative: 14 %
Lymphs Abs: 0.4 10*3/uL — ABNORMAL LOW (ref 0.7–4.0)
MCH: 32.5 pg (ref 26.0–34.0)
MCHC: 30.3 g/dL (ref 30.0–36.0)
MCV: 107.5 fL — ABNORMAL HIGH (ref 80.0–100.0)
Monocytes Absolute: 0.3 10*3/uL (ref 0.1–1.0)
Monocytes Relative: 9 %
Neutro Abs: 2 10*3/uL (ref 1.7–7.7)
Neutrophils Relative %: 72 %
Platelets: 89 10*3/uL — ABNORMAL LOW (ref 150–400)
RBC: 2.52 MIL/uL — ABNORMAL LOW (ref 4.22–5.81)
RDW: 18.8 % — ABNORMAL HIGH (ref 11.5–15.5)
WBC: 2.8 10*3/uL — ABNORMAL LOW (ref 4.0–10.5)
nRBC: 0 % (ref 0.0–0.2)

## 2020-05-30 LAB — VITAMIN D 25 HYDROXY (VIT D DEFICIENCY, FRACTURES): Vit D, 25-Hydroxy: 71.17 ng/mL (ref 30–100)

## 2020-05-30 LAB — FERRITIN: Ferritin: 308 ng/mL (ref 24–336)

## 2020-05-30 LAB — COMPREHENSIVE METABOLIC PANEL
ALT: 15 U/L (ref 0–44)
AST: 18 U/L (ref 15–41)
Albumin: 2.5 g/dL — ABNORMAL LOW (ref 3.5–5.0)
Alkaline Phosphatase: 140 U/L — ABNORMAL HIGH (ref 38–126)
Anion gap: 9 (ref 5–15)
BUN: 16 mg/dL (ref 8–23)
CO2: 28 mmol/L (ref 22–32)
Calcium: 8.2 mg/dL — ABNORMAL LOW (ref 8.9–10.3)
Chloride: 93 mmol/L — ABNORMAL LOW (ref 98–111)
Creatinine, Ser: 2.5 mg/dL — ABNORMAL HIGH (ref 0.61–1.24)
GFR, Estimated: 26 mL/min — ABNORMAL LOW (ref >60)
Glucose, Bld: 130 mg/dL — ABNORMAL HIGH (ref 70–99)
Potassium: 4.1 mmol/L (ref 3.5–5.1)
Sodium: 130 mmol/L — ABNORMAL LOW (ref 135–145)
Total Bilirubin: 0.7 mg/dL (ref 0.3–1.2)
Total Protein: 6.2 g/dL — ABNORMAL LOW (ref 6.5–8.1)

## 2020-05-30 LAB — VITAMIN B12: Vitamin B-12: 569 pg/mL (ref 180–914)

## 2020-05-30 LAB — LACTATE DEHYDROGENASE: LDH: 108 U/L (ref 98–192)

## 2020-05-30 NOTE — Progress Notes (Signed)
[address], [place] [address]   CLINIC:  Medical Oncology/Hematology  PCP:  Monico Blitz, MD [place] [address] [phone]   REASON FOR VISIT: Follow-up for normocytic anemia  CURRENT THERAPY: Epogen injections given at dialysis.  And intermittent iron infusions  INTERVAL HISTORY:  Mr. Heatherly 75 y.o. male returns for routine follow-up for normocytic anemia.  He was last seen in clinic on 03/28/2020.  He received 3 doses of IV Feraheme on 02/23/2020, 02/20/2020 and 03/28/2020.  He received 2 doses of ferric gluconate on 04/03/2020 and 04/05/2020.  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  He was placed on BiPAP and weaned off to nasal cannula, given IV antibiotics, had paracentesis where 4.2 L of fluid was removed and was started on lactulose with improvement of his symptoms.  He was discharged home.  He continues dialysis Monday Wednesday and Friday.  Since discharge he has done well.  He has had repeat paracentesis last on 05/09/2020 where 4.7 L of peritoneal fluid was removed.  He was given albumin replacement.  He continues to have and indwelling Foley catheter and is followed by [facility].  It was last changed on 05/14/2020.  Today he presents with his wife for possible IV iron.  He denies any additional rectal bleeding although he did lose some blood after dialysis last week when fistula catheter was not tightened well enough.  Has significant fatigue and shortness of breath with exertion but this is stable.  Has abdominal swelling and fullness prior to needing paracentesis.  Last paracentesis was at the beginning of the month.  He is scheduled for follow-up next week for possible paracentesis.  Has intermittent  leg swelling secondary to anasarca.  He is eating and drinking well.   REVIEW OF SYSTEMS:  Review of Systems  Constitutional: Positive for fatigue.  Respiratory: Positive for shortness of breath.   Cardiovascular: Positive for leg swelling.  Gastrointestinal: Positive for constipation.  Musculoskeletal: Positive for gait problem.  Neurological: Positive for gait problem.     PAST MEDICAL/SURGICAL HISTORY:  Past Medical History:  Diagnosis Date  . A-fib [facility])    when initially stating dialysis january 2021 at [facility] per spouse; never heard anything else about it  . Alcoholic cirrhosis ([place])    patient reports completing hep a and b vaccines in 2006  . Anemia    has had 3 units of prbcs 2013  . Anxiety   . Arthritis   . Asymptomatic gallstones    Ultrasound in 2006  . C. difficile colitis   . Cholelithiasis   . Chronic kidney disease    Dialysis M/W/F/Sa  . Depression   . Diabetes ([place])   . Duodenal ulcer with hemorrhage    per patient in 2006 or 2007, records have been requested  . Dyspnea    low oxygen level -  has O2 2 L all day  . 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 GERD (gastroesophageal reflux disease)   . Heart burn   . 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 HOH (hard of hearing)   . Hypertension   . Liver cirrhosis ([place])   . Splenomegaly    Ultrasound in 2006   Past Surgical History:  Procedure Laterality Date  .  AGILE CAPSULE N/A 09/20/2019   Procedure: AGILE CAPSULE;  Surgeon: Daneil Dolin, MD;  [facility];  Service: Endoscopy;  Laterality: N/A;  . APPENDECTOMY    . AV FISTULA PLACEMENT Left 08/17/2019   Procedure: ARTERIOVENOUS (AV) FISTULA CREATION VERSES ARTERIOVENOUS GRAFT;  Surgeon: [name], MD;  Location: [place];  Service: Vascular;  Laterality: Left;  . BASCILIC VEIN TRANSPOSITION Left 09/28/2019   Procedure: LEFT SECOND STAGE BASCILIC VEIN TRANSPOSITION;  Surgeon: [name], MD;  Location: [place];   Service: Vascular;  Laterality: Left;  . BIOPSY  02/03/2018   Procedure: BIOPSY;  Surgeon: Daneil Dolin, MD;  [facility];  Service: Endoscopy;;  bx of gastric polyps  . BIOPSY  09/19/2019   Procedure: BIOPSY;  Surgeon: Danie Binder, MD;  [facility];  Service: Endoscopy;;  . CATARACT EXTRACTION Right   . COLONOSCOPY  03/10/2005   Rectal polyp as described above, removed with snare. Left sided  diverticula. The remainder of the colonic mucosa appeared normal. Inflammed polyp on path.  . COLONOSCOPY  04/1999   Dr. Thea Silversmith polyps removed,  Path showed hyperplastic  . COLONOSCOPY  10/2015   Dr. Britta Mccreedy: diverticulosis, single sessile tubular adenoma 3-64mm in size removed from descending colon.   . COLONOSCOPY WITH ESOPHAGOGASTRODUODENOSCOPY (EGD)  03/31/2012   LFY:BOFBPZW AVMs. Colonic diverticulosis. tubular adenoma colon  . COLONOSCOPY WITH ESOPHAGOGASTRODUODENOSCOPY (EGD)  06/2019   FORSYTH: small esophageal varices without high risk stigmata, single large AVM on lesser curvature in gastric body s/p APC ablation, gastric antral and duodenal bulb polyposis. No significant source to explain transfusion dependent anemia. Colonoscopy with portal colopathy and diffuse edema, changes of Cdiff colitis on colonoscopy  . ESOPHAGEAL BANDING N/A 02/03/2018   Procedure: ESOPHAGEAL BANDING;  Surgeon: Daneil Dolin, MD;  [facility];  Service: Endoscopy;  Laterality: N/A;  . ESOPHAGOGASTRODUODENOSCOPY  01/15/2005   Three columns grade 1 to 2 esophageal varices, otherwise normal esophageal mucosa.  Esophagus was not manipulated otherwise./Nodularity of the antrum with overlying erosions, nonspecific finding. Path showed rare H.pylori  . ESOPHAGOGASTRODUODENOSCOPY  09/2008   Dr. Gaylene Brooks columns of grade 2-3 esoph varices, only one column was prominent. Portal gastropathy, multiple gastrc polyps at antrum, two were 2cm with black eschar, bulbar polyps, bulbar  erosions  . ESOPHAGOGASTRODUODENOSCOPY  11/2004   Dr. Brantley Stage 3 esoph varices  . ESOPHAGOGASTRODUODENOSCOPY  03/31/2012   RMR: 4 columns(3-GR 2, 1-GR1) non-bleeding esophageal varices, portal gastropathy, small HH, early GAVE, multiple gastric polyps   . ESOPHAGOGASTRODUODENOSCOPY (EGD) WITH PROPOFOL N/A 02/03/2018   Dr. Gala Romney: Esophageal varices, 3 columns grade 1-2.  Portal hypertensive gastropathy.  Multiple gastric polyps, biopsy consistent with hyperplastic.  [name] ESOPHAGOGASTRODUODENOSCOPY (EGD) WITH PROPOFOL N/A 09/19/2019   Fields: grade I and II esophageal varcies, mild portal hypertensive gastropathy, moderate gastritis but no H. pylori, single hyperplastic gastric polyp removed, obvious source for melena/transfusion dependent anemia not identified, may be due to friable gastric and duodenal mucosa in the setting of portal hypertension  . GASTRIC VARICES BANDING  03/31/2012   Procedure: GASTRIC VARICES BANDING;  Surgeon: Daneil Dolin, MD;  [facility];  Service: Endoscopy;  Laterality: N/A;  . GIVENS CAPSULE STUDY N/A 09/21/2019   Poor study with a lot of debris obstructing much of the view of the first approximate 3 hours out of 6 hours.  No obvious source of bleeding identified.  Sequela of bleeding and old blood seen in the form  of"whisps" of blood, blood flecks mostly toward the end of the study.  . IR REMOVAL TUN CV CATH W/O FL  02/15/2020  . POLYPECTOMY  09/19/2019   Procedure: POLYPECTOMY;  Surgeon: Danie Binder, MD;  [facility];  Service: Endoscopy;;  . UMBILICAL HERNIA REPAIR  2017   exploratory laparotomy, abdominal washout     SOCIAL HISTORY:  Social History   Socioeconomic History  . Marital status: Married    Spouse name: Not on file  . Number of children: 2  . Years of education: Not on file  . Highest education level: Not on file  Occupational History  . Occupation: RETIRED    Employer: SELF EMPLOYED  Tobacco Use  . Smoking  status: Former Smoker    Packs/day: 2.00    Years: 25.00    Pack years: 50.00    Types: Cigarettes    Quit date: 01/28/1986    Years since quitting: 34.3  . Smokeless tobacco: Never Used  Vaping Use  . Vaping Use: Never used  Substance and Sexual Activity  . Alcohol use: No    Comment: quit in 05/2013  . Drug use: No  . Sexual activity: Not Currently  Other Topics Concern  . Not on file  Social History Narrative   Two step children from second marriage (divorced) who live with him along with some grandchildren.    Social Determinants of Health   Financial Resource Strain: Not on file  Food Insecurity: No Food Insecurity  . Worried About Charity fundraiser in the Last Year: Never true  . Ran Out of Food in the Last Year: Never true  Transportation Needs: No Transportation Needs  . Lack of Transportation (Medical): No  . Lack of Transportation (Non-Medical): No  Physical Activity: Not on file  Stress: Not on file  Social Connections: Not on file  Intimate Partner Violence: Not on file    FAMILY HISTORY:  Family History  Problem Relation Age of Onset  . Colon cancer Father 28       deceased age 72  . Breast cancer Sister        deceased  . Diabetes Sister   . Stroke Mother   . Healthy Son   . Healthy Daughter   . Lung cancer Neg Hx   . Ovarian cancer Neg Hx     CURRENT MEDICATIONS:  Outpatient Encounter Medications as of 05/30/2020  Medication Sig  . acetaminophen (TYLENOL) 500 MG tablet Take 1,000 mg by mouth every 6 (six) hours as needed for mild pain.   . Alcohol Swabs (ALCOHOL PREP) 70 % PADS Apply 1 each topically 3 (three) times daily.  . Ascorbic Acid (VITAMIN C) 1000 MG tablet Take 1,000 mg by mouth 2 (two) times daily.  . cholestyramine (QUESTRAN) 4 GM/DOSE powder Take 1 packet by mouth daily as needed for diarrhea or loose stools.  [name] diltiazem (CARDIZEM CD) 120 MG 24 hr capsule Take 1 capsule (120 mg total) by mouth daily.  . furosemide (LASIX) 40 MG  tablet Take 40 mg by mouth 2 (two) times daily.  [name] gabapentin (NEURONTIN) 100 MG capsule TAKE 1 CAPSULE BY MOUTH AT BEDTIME  . HYDROcodone-acetaminophen (NORCO/VICODIN) 5-325 MG tablet Take 1 tablet by mouth 2 (two) times daily as needed for moderate pain or severe pain.   . hydrocortisone (ANUSOL-HC) 2.5 % rectal cream Place 1 application rectally 2 (two) times daily. (Patient taking differently: Place 1 application rectally 2 (two) times daily as needed  for hemorrhoids or anal itching. )  . hydrOXYzine (VISTARIL) 25 MG capsule Take 25 mg by mouth 3 (three) times daily as needed for itching.   . insulin degludec (TRESIBA FLEXTOUCH) 200 UNIT/ML FlexTouch Pen Inject 120 Units into the skin at bedtime. When blood sugars are up (Patient taking differently: Inject 120 Units into the skin at bedtime. )  . iron polysaccharides (NIFEREX) 150 MG capsule Take 150 mg by mouth at bedtime.   [name] lactulose (CHRONULAC) 10 GM/15ML solution Take 45 mLs (30 g total) by mouth 3 (three) times daily. (Patient taking differently: Take 30 g by mouth. 1-2 times per day)  . lactulose (CHRONULAC) 10 GM/15ML solution Take 30 cc in the morning and 45 cc in the evening.  [name] levofloxacin (LEVAQUIN) 500 MG tablet Take 1 tablet (500 mg total) by mouth every other day. Take after dialysis on MWF  . levofloxacin (LEVAQUIN) 500 MG tablet Take 500 mg 3 times a week after dialysis.  [name] metoprolol succinate (TOPROL-XL) 25 MG 24 hr tablet Take 1 tablet (25 mg total) by mouth daily. Take at bedtime  on Monday ,Wednesday and Friday due to dialaylisis (Patient taking differently: Take 25 mg by mouth See admin instructions. Take 25 mg by mouth at bedtime every night and 25 mg in morning on  Sun, Tues, Thurs and Sat)  . midodrine (PROAMATINE) 10 MG tablet Take 1 tablet (10 mg total) by mouth 2 (two) times daily with a meal.  . milk thistle 175 MG tablet Take 175 mg by mouth 3 (three) times daily.   . mirtazapine (REMERON) 30 MG tablet Take 1 tablet  (30 mg total) by mouth at bedtime.  . Multiple Vitamin (MULTIVITAMIN WITH MINERALS) TABS tablet Take 1 tablet by mouth in the morning.   [name] NOVOFINE 32G X 6 MM MISC Inject into the skin.  [name] nystatin (MYCOSTATIN/NYSTOP) powder Apply topically 3 (three) times daily. Into groin area and affected area  . omega-3 acid ethyl esters (LOVAZA) 1 g capsule Take 2 g by mouth 2 (two) times daily.   [name] omeprazole (PRILOSEC) 40 MG capsule Take 40 mg by mouth in the morning.   . ondansetron (ZOFRAN) 4 MG tablet Take 4 mg by mouth every 8 (eight) hours as needed for nausea or vomiting.   [name] oxyCODONE-acetaminophen (PERCOCET/ROXICET) 5-325 MG tablet Take 1 tablet by mouth 2 (two) times daily as needed for moderate pain.   . pravastatin (PRAVACHOL) 20 MG tablet Take 20 mg by mouth in the morning.   . Probiotic Product (PROBIOTIC DAILY PO) Take 1 tablet by mouth in the morning.   [name] spironolactone (ALDACTONE) 50 MG tablet Take 50 mg by mouth in the morning.   . tamsulosin (FLOMAX) 0.4 MG CAPS capsule Take 0.4 mg by mouth every evening.  . thiamine 100 MG tablet Take 100 mg by mouth in the morning. Vitamin B-1  . tiZANidine (ZANAFLEX) 2 MG tablet Take 2 mg by mouth 2 (two) times daily as needed for muscle spasms.   [name] triamcinolone cream (KENALOG) 0.1 % Apply 1 application topically 2 (two) times daily as needed (dry skin Itching).   . venlafaxine (EFFEXOR) 75 MG tablet Take 75 mg by mouth 3 (three) times daily with meals.   [name] XIFAXAN 550 MG TABS tablet TAKE 1 TABLET BY MOUTH TWICE DAILY (Patient taking differently: Take 550 mg by mouth 2 (two) times daily. )   No facility-administered encounter medications on file as of 05/30/2020.    ALLERGIES:  Allergies  Allergen Reactions  . Tape Itching and Rash     PHYSICAL EXAM:  ECOG Performance status: 1  Vitals:   05/30/20 1021  BP: (!) 105/46  Pulse: 78  Resp: 17  Temp: (!) 97.1 F (36.2 C)  SpO2: 93%   Filed Weights   Physical Exam Constitutional:       Appearance: Normal appearance. He is normal weight.     Comments: Obese-sitting in wheelchair.  Cardiovascular:     Rate and Rhythm: Normal rate and regular rhythm.     Heart sounds: Normal heart sounds.  Pulmonary:     Effort: Pulmonary effort is normal.     Breath sounds: Normal breath sounds.  Abdominal:     General: Bowel sounds are normal.     Palpations: Abdomen is soft.  Musculoskeletal:        General: Normal range of motion.  Skin:    General: Skin is warm.     Coloration: Skin is jaundiced and pale.  Neurological:     Mental Status: He is alert and oriented to person, place, and time. Mental status is at baseline.  Psychiatric:        Mood and Affect: Mood normal.        Behavior: Behavior normal.        Thought Content: Thought content normal.        Judgment: Judgment normal.      LABORATORY DATA:  I have reviewed the labs as listed.  CBC    Component Value Date/Time   WBC 2.8 (L) 05/30/2020 0913   RBC 2.52 (L) 05/30/2020 0913   HGB 8.2 (L) 05/30/2020 0913   HCT 27.1 (L) 05/30/2020 0913   HCT 28 02/25/2012 1034   PLT 89 (L) 05/30/2020 0913   PLT 125 02/25/2012 1034   MCV 107.5 (H) 05/30/2020 0913   MCV 82.0 02/25/2012 1034   MCH 32.5 05/30/2020 0913   MCHC 30.3 05/30/2020 0913   RDW 18.8 (H) 05/30/2020 0913   LYMPHSABS 0.4 (L) 05/30/2020 0913   MONOABS 0.3 05/30/2020 0913   EOSABS 0.1 05/30/2020 0913   BASOSABS 0.0 05/30/2020 0913   CMP Latest Ref Rng & Units 05/30/2020 04/04/2020 04/03/2020  Glucose 70 - 99 mg/dL 130(H) 221(H) 76  BUN 8 - 23 mg/dL 16 25(H) 33(H)  Creatinine 0.61 - 1.24 mg/dL 2.50(H) 3.08(H) 3.53(H)  Sodium 135 - 145 mmol/L 130(L) 127(L) 124(L)  Potassium 3.5 - 5.1 mmol/L 4.1 3.7 3.9  Chloride 98 - 111 mmol/L 93(L) 94(L) 88(L)  CO2 22 - 32 mmol/L 28 26 25   Calcium 8.9 - 10.3 mg/dL 8.2(L) 7.9(L) 8.0(L)  Total Protein 6.5 - 8.1 g/dL 6.2(L) - -  Total Bilirubin 0.3 - 1.2 mg/dL 0.7 - -  Alkaline Phos 38 - 126 U/L 140(H) - -  AST 15  - 41 U/L 18 - -  ALT 0 - 44 U/L 15 - -    All questions were answered to patient's stated satisfaction. Encouraged patient to call with any new concerns or questions before his next visit to the cancer center and we can certain see him sooner, if needed.     ASSESSMENT & PLAN:  1. 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 -CTAP on 01/10/2016 showed splenomegaly. -Work-up included SPEP and immunofixation is negative. -Last iron infusions were on 02/13/20, 02/20/20 -His last blood transfusion was on 11/20/2019 -Labs done on 05/30/2020 show hemoglobin of 8.2, platelets 89, WBC 2.8, ferritin 308, creatinine 2.5 -Proceed with 2  doses of IV iron given decline in hemoglobin and ferritin level.  -Wife is asking that iron be given in about 2 weeks given he has several appointments next week. -Dialysis is giving him Epogen injections weekly. -He will return to the clinic in 8 weeks with repeat labs  2. Vitamin B12 deficiency: -Methylmalonic acid was elevated at 672. B12 was normal at 569. -Continue oral B12 tablets daily.  3.  End-stage renal disease: -Creatinine is 2.50 -He gets dialysis Monday Wednesday Friday -He receives Epogen injections at his dialysis.  4.  Active GI bleed: -No additional bleeding per patient. -Followed by GI. -Plan is to perform colonoscopy in the near future. -Hemoglobin is 8.2.   5.  Abdominal swelling/ascites secondary to cirrhosis: -She is followed by GI. -She has follow-up on 06/06/2020.  -Has had several paracentesis. -Last paracentesis was on 05/09/2020-4.7 L removed  6.  Pancytopenia: -Secondary to comorbidities -White count 2.8 and ANC is 2.0 -Platelet count 89,000 -Hemoglobin 8.2 -Continue to monitor.   Disposition: -IV Feraheme X 2 in two weeks per patient. -Return to clinic in 8 weeks for lab work and assessment.  Greater than 50% was spent in counseling and coordination of care with this patient including but not  limited to discussion of the relevant topics above (See A&P) including, but not limited to diagnosis and management of acute and chronic medical conditions.   No problem-specific Assessment & Plan notes found for this encounter.  Orders placed this encounter:  No orders of the defined types were placed in this encounter.  Faythe Casa, NP 05/30/2020 10:39 AM  [place] [phone]

## 2020-05-30 NOTE — Telephone Encounter (Signed)
Message received from Spouse. She gave pt Omeprazole due to pt having bad Gerd. At pts last ov, he was asked to d.c Omeprazole and use tums. Please advise.

## 2020-05-31 DIAGNOSIS — N186 End stage renal disease: Secondary | ICD-10-CM | POA: Diagnosis not present

## 2020-05-31 DIAGNOSIS — Z992 Dependence on renal dialysis: Secondary | ICD-10-CM | POA: Diagnosis not present

## 2020-06-04 NOTE — Telephone Encounter (Signed)
Reviewed Dr. Roseanne Kaufman last note. Recommended discontinuing omeprazole. Not entirely sure why. Suspect Dr. Gala Romney recommended discontinuing omeprazole due to this medication increasing risk of SBP (bacterial infection of the fluid that accumulates in his abdomen) and increased risk of C. diff.   Recommend he continue to take tums as needed and follow a strict GERD diet.  Avoid fried, fatty, greasy, spicy, citrus foods. Avoid caffeine and carbonated beverages. Avoid chocolate. Try eating 4-6 small meals a day rather than 3 large meals. Do not eat within 3 hours of laying down. Prop head of bed up on wood or bricks to create a 6 inch incline.  Let us know if he is not able to keep symptoms under control with these strict dietary/lifestyle changes.

## 2020-06-04 NOTE — Telephone Encounter (Signed)
Jeffrey [name], please address with another provider

## 2020-06-04 NOTE — Telephone Encounter (Signed)
Spoke with pts spouse. She was notified of [name], [place] and Dr. Roseanne Kaufman recommendations. Pts spouse did restart the Omeprazole and feels it helped pt. Pt continues to have problems with his Jerrye Bushy. Pt feel like a bubble is in his upper abdomen everyday. Pt is eating tums three times daily without improvement. Pts spouse is aware of the risk of SBP and Cdiff.

## 2020-06-04 NOTE — Telephone Encounter (Signed)
Routing message to [name], [place] in Dr. Coralee North absence.

## 2020-06-05 NOTE — Telephone Encounter (Signed)
Noted. I have reached out to Dr. Theador Hawthorne to discuss other management options for GERD in the setting of dialysis. Hopefully, we will hear back tomorrow. Unfortunately H2 blockers such as Pepcid are not recommended at doses that would be helpful for him. We will call back with additional recommendations once Dr. Theador Hawthorne calls back.   Recommend he limit omeprazole as much as possible. Continue using tums and strict GERD diet/lifestyle as previously recommended.

## 2020-06-05 NOTE — Progress Notes (Unsigned)
Referring Provider: Monico Blitz, MD Primary Care Physician:  Monico Blitz, MD Primary GI Physician: Dr. Gala Romney  Chief Complaint  Patient presents with  . Follow-up    Acid reflux    HPI:   Jeffrey [name] is a 75 y.o. male presenting today for follow-up.  He has end stage ETOH cirrhosis (MELD Na 26 accounting for dialysis based on labs in December/INR in October) complicated by chronic volume overload with diuretics managed by nephrology and requiring paracentesis for ascites, recurrent hepatic encephalopathy due to medication noncompliance, recurrent SBP (2020 in November 2021) now on Levaquin at renal doses for SBP prophylaxis, portal hypertensive gastropathy, known grade 1/2 esophageal varices with EGD in April 2021 not on BB due to increased risk of SBP.  Also with history of GERD, ESRD on dialysis, transfusion dependent anemia s/p extensive evaluation (EGD X 2, colonoscopy, capsule endoscopy in 2021) now following with hematology. Last colonoscopy in January 2021 at outside facility with portal colopathy and diffuse edema, changes of C. difficile colitis.  Stated there appeared to be 3 polyps that were not removed due to concerns for bleeding.  Last seen in our office 04/09/2020 for follow-up with Dr. Gala Romney.  He had recently been discharged from the hospital for sepsis/SBP/lobar pneumonia/hepatic encephalopathy.  Patient remained with decompensation with encephalopathy and anasarca.  Had been scheduled for colonoscopy prior to office visit, but this was canceled due to hospitalization.  He continued with intermittent low-volume rectal bleeding but hemoglobin was stable, and it was felt that GI bleeding was not a major issue at this time and that patient was not a candidate for any sort of endoscopic evaluation at that time.  Recommended stop cholestyramine, stop omeprazole, continue Tums, increase lactulose to 30 cc in the morning and 45 cc in the evening with goal of 3 semiformed bowel movements  daily would not diarrhea, continue Xifaxan twice daily, continue Levaquin 3 times per week after dialysis, continue spironolactone and Lasix every day, adhere to 2 g sodium diet.  Regarding endoscopic evaluation, stated as long as patient had no overt GI bleeding, further GI evaluation via endoscopy is not recommended.  Plan to obtain labs and recommended follow-up in 4 weeks.  Labs were not completed. Underwent paracentesis 12/9 yielding 4.7 L.  No SBP.  Reviewed most recent labs completed 05/30/2020 with hematology: Hemoglobin fairly stable at 8.2, platelets 89.  Sodium low but stable at 130, alk phos elevated but stable at 140, otherwise LFTs and bilirubin within normal limits, creatinine stable at 2.5.  Ferritin 308.  Patient's wife called 12/30 reporting patient was having bad GERD symptoms and Tums were not working.  They had resumed omeprazole which helped.  Suspected Dr. Gala Romney had recommended discontinuing omeprazole due to risk of SBP and C. difficile.  This was explained to patient's wife.  Recommended limiting omeprazole as much as possible, using Tums, and following a strict GERD diet. I reached out to Dr. Toya Smothers office to discuss other GERD management options in the setting of dialysis and am waiting to hear back.  Today: Lost about 23 lbs over the last 2 months. LE swelling is doing better. States he couldn't pull his pants legs up in November.  Feels he needs another paracentesis.  States he felt much better after his last para in December, but his abdomen feels distended and tight again.  Also has a hard place in the very bottom of his abdomen which has been present since hospitalization in November, but is improving.  Continues with dialysis 3 days a week and has not missed a day since hospital discharge.  Continues to take Lasix 40 mg twice daily and spironolactone 50 mg daily.  Feels mental status is improving.  States it is better than when he saw Dr. Gala Romney.  Wife states he just  processes slower, but overall seems to be doing well.  No BRBPR or melena.  Having 2-3 BMs daily.  Can be on the harder side at times.  He is taking lactulose 30 cc in the morning and 45 cc in the evening on days he is at home.  On dialysis days, he does not take the morning dose.  Continues Xifaxan twice daily.  Also continues to take Levaquin 3 days a week after dialysis.   He is following closely with hematology.  He is scheduled for iron infusions on the 18th and 25th of January.   GERD: Over the last few weeks, he has noticed the sensation of a bubble/pressure in his upper abdomen.  Feels like he needs to burp, but cannot.  Symptoms are daily.  Worse with eating.  Stopped omeprazole briefly, and this is when he noticed symptoms began.  However, he also feels the pressure in his abdomen may be contributing.  He felt better after last paracentesis.  Denies typical acid reflux symptoms, burning in his esophagus, dysphagia, nausea, or vomiting.  He did have one episode where he tried to burp after eating and regurgitated food.  States he is not any fried foods, spicy foods, or drinking soda.  He has resumed omeprazole daily at this point and feels this has helped somewhat, but symptoms continue daily.   Reports falling off the end of his bed about 1 week ago.  Still mild soreness in his back in right side.  He has bruises along his back.  Wife also states he has bruises on his buttocks.  He did not hit his head or lose consciousness.  Past Medical History:  Diagnosis Date  . A-fib [facility]    when initially stating dialysis january 2021 at [facility] per spouse; never heard anything else about it  . Alcoholic cirrhosis ([place])    patient reports completing hep a and b vaccines in 2006  . Anemia    has had 3 units of prbcs 2013  . Anxiety   . Arthritis   . Asymptomatic gallstones    Ultrasound in 2006  . C. difficile colitis   . Cholelithiasis   . Chronic kidney disease    Dialysis M/W/F/Sa   . Depression   . Diabetes ([place])   . Duodenal ulcer with hemorrhage    per patient in 2006 or 2007, records have been requested  . Dyspnea    low oxygen level -  has O2 2 L all day  . 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 GERD (gastroesophageal reflux disease)   . Heart burn   . 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 HOH (hard of hearing)   . Hypertension   . Liver cirrhosis ([place])   . SBP (spontaneous bacterial peritonitis) ([place])    2020, November 2021.   [name] Splenomegaly    Ultrasound in 2006    Past Surgical History:  Procedure Laterality Date  . AGILE CAPSULE N/A 09/20/2019   Procedure: AGILE CAPSULE;  Surgeon: Daneil Dolin, MD;  [facility];  Service: Endoscopy;  Laterality: N/A;  . APPENDECTOMY    . AV  FISTULA PLACEMENT Left 08/17/2019   Procedure: ARTERIOVENOUS (AV) FISTULA CREATION VERSES ARTERIOVENOUS GRAFT;  Surgeon: [name], MD;  Location: [place];  Service: Vascular;  Laterality: Left;  . BASCILIC VEIN TRANSPOSITION Left 09/28/2019   Procedure: LEFT SECOND STAGE BASCILIC VEIN TRANSPOSITION;  Surgeon: [name], MD;  Location: [place];  Service: Vascular;  Laterality: Left;  . BIOPSY  02/03/2018   Procedure: BIOPSY;  Surgeon: Daneil Dolin, MD;  [facility];  Service: Endoscopy;;  bx of gastric polyps  . BIOPSY  09/19/2019   Procedure: BIOPSY;  Surgeon: Danie Binder, MD;  [facility];  Service: Endoscopy;;  . CATARACT EXTRACTION Right   . COLONOSCOPY  03/10/2005   Rectal polyp as described above, removed with snare. Left sided  diverticula. The remainder of the colonic mucosa appeared normal. Inflammed polyp on path.  . COLONOSCOPY  04/1999   Dr. Thea Silversmith polyps removed,  Path showed hyperplastic  . COLONOSCOPY  10/2015   Dr. Britta Mccreedy: diverticulosis, single sessile tubular adenoma 3-45mm in size removed from descending colon.   . COLONOSCOPY WITH ESOPHAGOGASTRODUODENOSCOPY (EGD)  03/31/2012   JOA:CZYSAYT AVMs.  Colonic diverticulosis. tubular adenoma colon  . COLONOSCOPY WITH ESOPHAGOGASTRODUODENOSCOPY (EGD)  06/2019   FORSYTH: small esophageal varices without high risk stigmata, single large AVM on lesser curvature in gastric body s/p APC ablation, gastric antral and duodenal bulb polyposis. No significant source to explain transfusion dependent anemia. Colonoscopy with portal colopathy and diffuse edema, changes of Cdiff colitis on colonoscopy  . ESOPHAGEAL BANDING N/A 02/03/2018   Procedure: ESOPHAGEAL BANDING;  Surgeon: Daneil Dolin, MD;  [facility];  Service: Endoscopy;  Laterality: N/A;  . ESOPHAGOGASTRODUODENOSCOPY  01/15/2005   Three columns grade 1 to 2 esophageal varices, otherwise normal esophageal mucosa.  Esophagus was not manipulated otherwise./Nodularity of the antrum with overlying erosions, nonspecific finding. Path showed rare H.pylori  . ESOPHAGOGASTRODUODENOSCOPY  09/2008   Dr. Gaylene Brooks columns of grade 2-3 esoph varices, only one column was prominent. Portal gastropathy, multiple gastrc polyps at antrum, two were 2cm with black eschar, bulbar polyps, bulbar erosions  . ESOPHAGOGASTRODUODENOSCOPY  11/2004   Dr. Brantley Stage 3 esoph varices  . ESOPHAGOGASTRODUODENOSCOPY  03/31/2012   RMR: 4 columns(3-GR 2, 1-GR1) non-bleeding esophageal varices, portal gastropathy, small HH, early GAVE, multiple gastric polyps   . ESOPHAGOGASTRODUODENOSCOPY (EGD) WITH PROPOFOL N/A 02/03/2018   Dr. Gala Romney: Esophageal varices, 3 columns grade 1-2.  Portal hypertensive gastropathy.  Multiple gastric polyps, biopsy consistent with hyperplastic.  [name] ESOPHAGOGASTRODUODENOSCOPY (EGD) WITH PROPOFOL N/A 09/19/2019   Fields: grade I and II esophageal varcies, mild portal hypertensive gastropathy, moderate gastritis but no H. pylori, single hyperplastic gastric polyp removed, obvious source for melena/transfusion dependent anemia not identified, may be due to friable gastric and duodenal mucosa in  the setting of portal hypertension  . GASTRIC VARICES BANDING  03/31/2012   Procedure: GASTRIC VARICES BANDING;  Surgeon: Daneil Dolin, MD;  [facility];  Service: Endoscopy;  Laterality: N/A;  . GIVENS CAPSULE STUDY N/A 09/21/2019   Poor study with a lot of debris obstructing much of the view of the first approximate 3 hours out of 6 hours.  No obvious source of bleeding identified.  Sequela of bleeding and old blood seen in the form of"whisps" of blood, blood flecks mostly toward the end of the study.  . IR REMOVAL TUN CV CATH W/O FL  02/15/2020  . POLYPECTOMY  09/19/2019   Procedure: POLYPECTOMY;  Surgeon:  Danie Binder, MD;  [facility];  Service: Endoscopy;;  . UMBILICAL HERNIA REPAIR  2017   exploratory laparotomy, abdominal washout    Current Outpatient Medications  Medication Sig Dispense Refill  . acetaminophen (TYLENOL) 500 MG tablet Take 1,000 mg by mouth as needed for mild pain.    . Alcohol Swabs (ALCOHOL PREP) 70 % PADS Apply 1 each topically 3 (three) times daily.    . Ascorbic Acid (VITAMIN C) 1000 MG tablet Take 1,000 mg by mouth 2 (two) times daily.    [name] diltiazem (TIAZAC) 120 MG 24 hr capsule Take 120 mg by mouth daily.    . furosemide (LASIX) 40 MG tablet Take 40 mg by mouth 2 (two) times daily.    [name] gabapentin (NEURONTIN) 100 MG capsule TAKE 1 CAPSULE BY MOUTH AT BEDTIME 90 capsule 0  . hydrocortisone (ANUSOL-HC) 2.5 % rectal cream Place 1 application rectally 2 (two) times daily. (Patient taking differently: Place 1 application rectally 2 (two) times daily as needed for hemorrhoids or anal itching.) 30 g 1  . hydrOXYzine (VISTARIL) 25 MG capsule Take 25 mg by mouth 3 (three) times daily as needed for itching.     . insulin degludec (TRESIBA FLEXTOUCH) 200 UNIT/ML FlexTouch Pen Inject 120 Units into the skin at bedtime. When blood sugars are up (Patient taking differently: Inject 120 Units into the skin at bedtime.)    . iron polysaccharides  (NIFEREX) 150 MG capsule Take 150 mg by mouth at bedtime.     [name] lactulose (CHRONULAC) 10 GM/15ML solution Take 30 cc in the morning and 45 cc in the evening. (Patient taking differently: as needed. Take 30 cc in the morning and 45 cc in the evening.) 473 mL 12  . Lancet Devices (ADJUSTABLE LANCING DEVICE) MISC Apply 1 each topically daily.    [name] levofloxacin (LEVAQUIN) 500 MG tablet Take 500 mg 3 times a week after dialysis. 12 tablet 11  . metoprolol succinate (TOPROL-XL) 25 MG 24 hr tablet Take 1 tablet (25 mg total) by mouth daily. Take at bedtime  on Monday ,Wednesday and Friday due to dialaylisis (Patient taking differently: Take 25 mg by mouth See admin instructions. Take 25 mg by mouth at bedtime every night and 25 mg in morning on  Sun, Tues, Thurs and Sat)    . midodrine (PROAMATINE) 10 MG tablet Take 1 tablet (10 mg total) by mouth 2 (two) times daily with a meal. 60 tablet 1  . milk thistle 175 MG tablet Take 175 mg by mouth 3 (three) times daily.     . mirtazapine (REMERON) 30 MG tablet Take 1 tablet (30 mg total) by mouth at bedtime. 30 tablet 3  . Multiple Vitamin (MULTIVITAMIN WITH MINERALS) TABS tablet Take 1 tablet by mouth in the morning.     [name] NOVOFINE 32G X 6 MM MISC Inject into the skin.    [name] nystatin (MYCOSTATIN/NYSTOP) powder Apply topically 3 (three) times daily. Into groin area and affected area (Patient taking differently: Apply topically as needed. Into groin area and affected area) 45 g 0  . omega-3 acid ethyl esters (LOVAZA) 1 g capsule Take 2 g by mouth 2 (two) times daily.     [name] omeprazole (PRILOSEC) 40 MG capsule Take 40 mg by mouth in the morning.     . ondansetron (ZOFRAN) 4 MG tablet Take 4 mg by mouth as needed for nausea or vomiting.    [name] oxyCODONE-acetaminophen (PERCOCET/ROXICET) 5-325 MG tablet Take 1 tablet  by mouth as needed for moderate pain.    . pravastatin (PRAVACHOL) 20 MG tablet Take 20 mg by mouth in the morning.     . Probiotic Product (PROBIOTIC DAILY  PO) Take 1 tablet by mouth in the morning.     [name] spironolactone (ALDACTONE) 50 MG tablet Take 50 mg by mouth in the morning.     . tamsulosin (FLOMAX) 0.4 MG CAPS capsule Take 0.4 mg by mouth every evening.    . thiamine 100 MG tablet Take 100 mg by mouth daily.    [name] tiZANidine (ZANAFLEX) 2 MG tablet Take 2 mg by mouth 2 (two) times daily as needed for muscle spasms.     [name] triamcinolone cream (KENALOG) 0.1 % Apply 1 application topically 2 (two) times daily as needed (dry skin Itching).   3  . venlafaxine (EFFEXOR) 75 MG tablet Take 75 mg by mouth 3 (three) times daily with meals.     [name] XIFAXAN 550 MG TABS tablet TAKE 1 TABLET BY MOUTH TWICE DAILY (Patient taking differently: Take 550 mg by mouth 2 (two) times daily.) 60 tablet 11   No current facility-administered medications for this visit.    Allergies as of 06/06/2020 - Review Complete 06/06/2020  Allergen Reaction Noted  . Tape Itching and Rash 09/18/2019    Family History  Problem Relation Age of Onset  . Colon cancer Father 45       deceased age 76  . Breast cancer Sister        deceased  . Diabetes Sister   . Stroke Mother   . Healthy Son   . Healthy Daughter   . Lung cancer Neg Hx   . Ovarian cancer Neg Hx     Social History   Socioeconomic History  . Marital status: Married    Spouse name: Not on file  . Number of children: 2  . Years of education: Not on file  . Highest education level: Not on file  Occupational History  . Occupation: RETIRED    Employer: SELF EMPLOYED  Tobacco Use  . Smoking status: Former Smoker    Packs/day: 2.00    Years: 25.00    Pack years: 50.00    Types: Cigarettes    Quit date: 01/28/1986    Years since quitting: 34.3  . Smokeless tobacco: Never Used  Vaping Use  . Vaping Use: Never used  Substance and Sexual Activity  . Alcohol use: No    Comment: quit in 05/2013  . Drug use: No  . Sexual activity: Not Currently  Other Topics Concern  . Not on file  Social History  Narrative   Two step children from second marriage (divorced) who live with him along with some grandchildren.    Social Determinants of Health   Financial Resource Strain: Not on file  Food Insecurity: No Food Insecurity  . Worried About Charity fundraiser in the Last Year: Never true  . Ran Out of Food in the Last Year: Never true  Transportation Needs: No Transportation Needs  . Lack of Transportation (Medical): No  . Lack of Transportation (Non-Medical): No  Physical Activity: Inactive  . Days of Exercise per Week: 0 days  . Minutes of Exercise per Session: 0 min  Stress: Not on file  Social Connections: Not on file    Review of Systems: Gen: Denies fever, chills, cold or flulike symptoms, presyncope, syncope.  Admits to chronic weakness in his legs.  He can walk with a  walker. CV: Denies chest pain or palpitations. Resp: Shortness of breath with little physical activity.  No shortness of breath at rest.  Sleeps with 3 L supplemental oxygen via nasal cannula. GI: see HPI Heme: See HPI  Physical Exam: BP (!) 119/50   Pulse 82   Temp (!) 97.1 F (36.2 C) (Temporal)   Ht 5' 7"  (1.702 m)   Wt 220 lb 6.4 oz (100 kg)   BMI 34.52 kg/m  General:   Alert and oriented. No distress noted. Pleasant and cooperative.  Walking with a walker.   Head:  Normocephalic and atraumatic. Eyes:  Conjuctiva clear without scleral icterus. Heart:  S1, S2 present without murmurs appreciated. Lungs:  Clear to auscultation bilaterally. No wheezes, rales, or rhonchi. No distress.  Abdomen:  +BS.  Abdomen is distended.  Upper abdomen is firm, lower abdomen is fairly soft.  Mild tenderness to palpation in RUQ.  Patient states this is secondary to recent fall 1 week ago off the edge of his bed.  This is improving.  1+ dependent pitting edema in the very bottom of his abdomen/pannus without erythema or warmth. No rebound or guarding. No masses noted.  Msk:  Symmetrical without gross deformities. Normal  posture. Extremities:  With 1+ bilateral LE pitting edema up to knees.  Chronic venous stasis changes. Skin: Bruising noted on hands, arms, and right back. Patient states he fell 1 week ago off the end of the bed.  Neurologic:  Alert and  oriented x4.  Slight asterixis.  Psych: Normal mood and affect.

## 2020-06-06 ENCOUNTER — Other Ambulatory Visit (HOSPITAL_COMMUNITY)
Admission: RE | Admit: 2020-06-06 | Discharge: 2020-06-06 | Disposition: A | Discharge status: Home / Self Care | Payer: Medicare Other | Source: Ambulatory Visit | Attending: Gastroenterology | Admitting: Gastroenterology

## 2020-06-06 ENCOUNTER — Other Ambulatory Visit: Payer: Self-pay

## 2020-06-06 ENCOUNTER — Telehealth: Payer: Self-pay

## 2020-06-06 ENCOUNTER — Encounter: Payer: Self-pay | Admitting: Gastroenterology

## 2020-06-06 ENCOUNTER — Ambulatory Visit: Payer: Medicare Other | Admitting: Gastroenterology

## 2020-06-06 VITALS — BP 119/50 | HR 82 | Temp 97.1°F | Ht 67.0 in | Wt 220.4 lb

## 2020-06-06 DIAGNOSIS — K219 Gastro-esophageal reflux disease without esophagitis: Secondary | ICD-10-CM

## 2020-06-06 DIAGNOSIS — K7031 Alcoholic cirrhosis of liver with ascites: Secondary | ICD-10-CM | POA: Diagnosis present

## 2020-06-06 LAB — PROTIME-INR
INR: 1.1 (ref 0.8–1.2)
Prothrombin Time: 14.1 seconds (ref 11.4–15.2)

## 2020-06-06 LAB — AMMONIA: Ammonia: 25 umol/L (ref 9–35)

## 2020-06-06 NOTE — Telephone Encounter (Signed)
Para scheduled for 06/13/20 at 9:00am, arrive at 8:45am.   Called and informed pt's wife of para appt. Letter mailed.

## 2020-06-06 NOTE — Assessment & Plan Note (Addendum)
Chronic history of GERD.  At his last office visit November 2021, Dr. Gala Romney recommended discontinuing omeprazole 40 mg.  After discontinuing medication, patient reports developing the sensation of a bubble/pressure in his upper abdomen, worsened after meals. Feels if he could burp, symptoms would improve.  Taking Tums as recommended which is unhelpful.  Denies typical acid reflux symptoms, dysphagia, nausea, or vomiting.  He did have one episode where he attempted to burp and regurgitated food.  He has since resumed omeprazole himself and feels this has helped somewhat, but symptoms continue.  Query whether ascites may be causing a compression effect on his stomach contributing to this pressure/bubble sensation in his upper abdomen after eating.  We discussed the risk of PPIs including SBP and C. difficile recurrence, both of which patient has history of.  I reached out to patient's nephrologist yesterday regarding GERD management options in setting of hemodialysis.  I am writing to hear back.  However, after discussion today, I am not so sure these symptoms are actually related to GERD.    I requested patient stop omeprazole and only take this medication if he absolutely has to.  Continue Tums. We are arranging for paracentesis in the very near future.  We will see if symptoms improve after para.  Further recommendations to follow.

## 2020-06-06 NOTE — Patient Instructions (Addendum)
Please have labs completed at [facility].  We will arrange for you to have a paracentesis.  Day of paracentesis, do not take furosemide or spironolactone.  Please stop omeprazole and only take this medication if you absolutely have to.  This medication increases the risk of infection of the fluid in your abdomen and recurrent C. difficile infection. I suspect the sensation you are having in the upper abdomen is related to fluid acclimation in your abdomen.   Continue taking Tums with meals.  I am waiting to hear back from your nephrologist regarding further management of GERD symptoms.  Please increase lactulose to 45 cc in the morning and 45 cc in the evening.  Goal of 3-4 soft/semiformed stools daily.  Please let me know if this causes significant diarrhea/incontinence.  Continue Xifaxan 550 mg twice daily.  Follow a strict low-sodium diet.  No more than 2000 mg of sodium per day.  This includes everything you eat and drink.  Continue taking Lasix 40 mg twice daily and spironolactone 50 mg daily.  Continue Levaquin 500 mg 3 times a week after dialysis.  We will plan to follow-up in 8 weeks. Please call with questions or concerns prior.   [name], PA-C [facility]    Low-Sodium Eating Plan Sodium, which is an element that makes up salt, helps you maintain a healthy balance of fluids in your body. Too much sodium can increase your blood pressure and cause fluid and waste to be held in your body. Your health care provider or dietitian may recommend following this plan if you have high blood pressure (hypertension), kidney disease, liver disease, or heart failure. Eating less sodium can help lower your blood pressure, reduce swelling, and protect your heart, liver, and kidneys. What are tips for following this plan? General guidelines  Most people on this plan should limit their sodium intake to 1,500-2,000 mg (milligrams) of sodium each day. Reading food  labels   The Nutrition Facts label lists the amount of sodium in one serving of the food. If you eat more than one serving, you must multiply the listed amount of sodium by the number of servings.  Choose foods with less than 140 mg of sodium per serving.  Avoid foods with 300 mg of sodium or more per serving. Shopping  Look for lower-sodium products, often labeled as "low-sodium" or "no salt added."  Always check the sodium content even if foods are labeled as "unsalted" or "no salt added".  Buy fresh foods. ? Avoid canned foods and premade or frozen meals. ? Avoid canned, cured, or processed meats  Buy breads that have less than 80 mg of sodium per slice. Cooking  Eat more home-cooked food and less restaurant, buffet, and fast food.  Avoid adding salt when cooking. Use salt-free seasonings or herbs instead of table salt or sea salt. Check with your health care provider or pharmacist before using salt substitutes.  Cook with plant-based oils, such as canola, sunflower, or olive oil. Meal planning  When eating at a restaurant, ask that your food be prepared with less salt or no salt, if possible.  Avoid foods that contain MSG (monosodium glutamate). MSG is sometimes added to [place] food, bouillon, and some canned foods. What foods are recommended? The items listed may not be a complete list. Talk with your dietitian about what dietary choices are best for you. Grains Low-sodium cereals, including oats, puffed wheat and rice, and shredded wheat. Low-sodium crackers. Unsalted rice. Unsalted pasta. Low-sodium bread. Whole-grain  breads and whole-grain pasta. Vegetables Fresh or frozen vegetables. "No salt added" canned vegetables. "No salt added" tomato sauce and paste. Low-sodium or reduced-sodium tomato and vegetable juice. Fruits Fresh, frozen, or canned fruit. Fruit juice. Meats and other protein foods Fresh or frozen (no salt added) meat, poultry, seafood, and fish.  Low-sodium canned tuna and salmon. Unsalted nuts. Dried peas, beans, and lentils without added salt. Unsalted canned beans. Eggs. Unsalted nut butters. Dairy Milk. Soy milk. Cheese that is naturally low in sodium, such as ricotta cheese, fresh mozzarella, or Swiss cheese Low-sodium or reduced-sodium cheese. Cream cheese. Yogurt. Fats and oils Unsalted butter. Unsalted margarine with no trans fat. Vegetable oils such as canola or olive oils. Seasonings and other foods Fresh and dried herbs and spices. Salt-free seasonings. Low-sodium mustard and ketchup. Sodium-free salad dressing. Sodium-free light mayonnaise. Fresh or refrigerated horseradish. Lemon juice. Vinegar. Homemade, reduced-sodium, or low-sodium soups. Unsalted popcorn and pretzels. Low-salt or salt-free chips. What foods are not recommended? The items listed may not be a complete list. Talk with your dietitian about what dietary choices are best for you. Grains Instant hot cereals. Bread stuffing, pancake, and biscuit mixes. Croutons. Seasoned rice or pasta mixes. Noodle soup cups. Boxed or frozen macaroni and cheese. Regular salted crackers. Self-rising flour. Vegetables Sauerkraut, pickled vegetables, and relishes. Olives. [place] fries. Onion rings. Regular canned vegetables (not low-sodium or reduced-sodium). Regular canned tomato sauce and paste (not low-sodium or reduced-sodium). Regular tomato and vegetable juice (not low-sodium or reduced-sodium). Frozen vegetables in sauces. Meats and other protein foods Meat or fish that is salted, canned, smoked, spiced, or pickled. Bacon, ham, sausage, hotdogs, corned beef, chipped beef, packaged lunch meats, salt pork, jerky, pickled herring, anchovies, regular canned tuna, sardines, salted nuts. Dairy Processed cheese and cheese spreads. Cheese curds. Blue cheese. Feta cheese. String cheese. Regular cottage cheese. Buttermilk. Canned milk. Fats and oils Salted butter. Regular margarine.  Ghee. Bacon fat. Seasonings and other foods Onion salt, garlic salt, seasoned salt, table salt, and sea salt. Canned and packaged gravies. Worcestershire sauce. Tartar sauce. Barbecue sauce. Teriyaki sauce. Soy sauce, including reduced-sodium. Steak sauce. Fish sauce. Oyster sauce. Cocktail sauce. Horseradish that you find on the shelf. Regular ketchup and mustard. Meat flavorings and tenderizers. Bouillon cubes. Hot sauce and Tabasco sauce. Premade or packaged marinades. Premade or packaged taco seasonings. Relishes. Regular salad dressings. Salsa. Potato and tortilla chips. Corn chips and puffs. Salted popcorn and pretzels. Canned or dried soups. Pizza. Frozen entrees and pot pies. Summary  Eating less sodium can help lower your blood pressure, reduce swelling, and protect your heart, liver, and kidneys.  Most people on this plan should limit their sodium intake to 1,500-2,000 mg (milligrams) of sodium each day.  Canned, boxed, and frozen foods are high in sodium. Restaurant foods, fast foods, and pizza are also very high in sodium. You also get sodium by adding salt to food.  Try to cook at home, eat more fresh fruits and vegetables, and eat less fast food, canned, processed, or prepared foods. This information is not intended to replace advice given to you by your health care provider. Make sure you discuss any questions you have with your health care provider. Document Revised: 04/30/2017 Document Reviewed: 05/11/2016 Elsevier Patient Education  2020 [employer].

## 2020-06-06 NOTE — Assessment & Plan Note (Addendum)
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  Patient seems clinically improved compared to 4 weeks ago. He has been more compliant with dialysis and anasarca is improving.  He is down 23 pounds in the last 2 months.  Feels he needs another paracentesis.abdomen is distended and moderately tense in the upper abdomen, lower abdomen is more soft. Last para yielded 4.7 L on 12/9 and patient reports feeling much better thereafter.  Mental status seems improved with improving compliance with lactulose, typically having 2-3 BMs daily, can be on the harder side at times. Skips morning lactulose on dialysis days. Also on Xifaxan twice daily.  Slight asterixis on exam.  No overt GI bleeding.  Continues to abstain from alcohol.   Plan:  Update INR, AFP, and ammonia Ultrasound paracentesis with labs and albumin per protocol. Try increasing lactulose to 45 cc twice daily.  Goal of 3-4 soft/semiformed BMs daily. Requested patient let me know if this causes significant diarrhea.  Continue Xifaxan twice daily. Continue Lasix 40 mg twice daily and spironolactone 50 mg daily. Keep all dialysis appointments. Continue Levaquin 3 days a week after dialysis. No more than 2000 mg of sodium daily.  Handout provided. No more than 2000 mg Tylenol daily. Follow-up in 8 weeks.

## 2020-06-06 NOTE — Progress Notes (Signed)
Cc'ed to pcp °

## 2020-06-06 NOTE — Telephone Encounter (Signed)
This was addressed at [place] today.

## 2020-06-06 NOTE — Telephone Encounter (Signed)
Spoke with pt. Spouse is aware that we will get back with her once [name], [place] hears from Dr. Theador Hawthorne and [name], PA recommends pt limit omeprazole as much as possible. Continue using tums and strict GERD diet/lifestyle as previously recommended.

## 2020-06-07 LAB — AFP TUMOR MARKER: AFP, Serum, Tumor Marker: 1.7 ng/mL (ref 0.0–8.3)

## 2020-06-11 ENCOUNTER — Other Ambulatory Visit: Payer: Self-pay

## 2020-06-11 ENCOUNTER — Ambulatory Visit (INDEPENDENT_AMBULATORY_CARE_PROVIDER_SITE_OTHER): Payer: Medicare Other

## 2020-06-11 DIAGNOSIS — R339 Retention of urine, unspecified: Secondary | ICD-10-CM

## 2020-06-11 NOTE — Progress Notes (Signed)
Cath Change/ Replacement  Patient is present today for a catheter change due to urinary retention.  71ml of water was removed from the balloon, a 16FR foley cath was removed with out difficulty.  Patient was cleaned and prepped in a sterile fashion with betadine. A 16 FR foley cath was replaced into the bladder no complications were noted Urine return was noted 55ml and urine was yellow in color. The balloon was filled with 54ml of sterile water. A leg bag bag was attached for drainage.   Patient was given proper instruction on catheter care.    Performed by: Chellsie Gomer, lpn  Follow up: Keep next schedule [place]

## 2020-06-11 NOTE — Patient Instructions (Signed)

## 2020-06-13 ENCOUNTER — Ambulatory Visit (HOSPITAL_COMMUNITY)
Admission: RE | Admit: 2020-06-13 | Discharge: 2020-06-13 | Disposition: A | Discharge status: Home / Self Care | Payer: Medicare Other | Source: Ambulatory Visit | Attending: Gastroenterology | Admitting: Gastroenterology

## 2020-06-13 ENCOUNTER — Other Ambulatory Visit: Payer: Self-pay

## 2020-06-13 ENCOUNTER — Encounter (HOSPITAL_COMMUNITY): Payer: Self-pay

## 2020-06-13 DIAGNOSIS — K7031 Alcoholic cirrhosis of liver with ascites: Secondary | ICD-10-CM | POA: Diagnosis present

## 2020-06-13 LAB — GRAM STAIN: Gram Stain: NONE SEEN

## 2020-06-13 LAB — BODY FLUID CELL COUNT WITH DIFFERENTIAL
Eos, Fluid: 1 %
Lymphs, Fluid: 71 %
Monocyte-Macrophage-Serous Fluid: 20 % — ABNORMAL LOW (ref 50–90)
Neutrophil Count, Fluid: 8 % (ref 0–25)
Other Cells, Fluid: REACTIVE %
Total Nucleated Cell Count, Fluid: 165 cu mm (ref 0–1000)

## 2020-06-13 NOTE — Sedation Documentation (Signed)
Patient tolerated left sided paracentesis procedure well today and 3.1 Liters of cloudy yellow fluid removed with labs collected and sent for processing. PT verbalized understanding of discharge instructions and vital signs remained stable at completion of procedure. PT pushed back to waiting area via wheelchair at this time to family.

## 2020-06-13 NOTE — Discharge Instructions (Signed)
Paracentesis, Care After This sheet gives you information about how to care for yourself after your procedure. Your health care provider may also give you more specific instructions. If you have problems or questions, contact your health care provider. What can I expect after the procedure? After the procedure, it is common to have a small amount of clear fluid coming from the puncture site. Follow these instructions at home: Puncture site care  Follow instructions from your health care provider about how to take care of your puncture site. Make sure you: ? Wash your hands with soap and water before and after you change your bandage (dressing). If soap and water are not available, use hand sanitizer. ? Change your dressing as told by your health care provider.  Check your puncture area every day for signs of infection. Check for: ? Redness, swelling, or pain. ? More fluid or blood. ? Warmth. ? Pus or a bad smell.   General instructions  Return to your normal activities as told by your health care provider. Ask your health care provider what activities are safe for you.  Take over-the-counter and prescription medicines only as told by your health care provider.  Do not take baths, swim, or use a hot tub until your health care provider approves. Ask your health care provider if you may take showers. You may only be allowed to take sponge baths.  Keep all follow-up visits as told by your health care provider. This is important. Contact a health care provider if:  You have redness, swelling, or pain at your puncture site.  You have more fluid or blood coming from your puncture site.  Your puncture site feels warm to the touch.  You have pus or a bad smell coming from your puncture site.  You have a fever. Get help right away if:  You have chest pain or shortness of breath.  You develop increasing pain, discomfort, or swelling in your abdomen.  You feel dizzy or light-headed or  you faint. Summary  After the procedure, it is common to have a small amount of clear fluid coming from the puncture site.  Follow instructions from your health care provider about how to take care of your puncture site.  Check your puncture area every day signs of infection.  Keep all follow-up visits as told by your health care provider. This information is not intended to replace advice given to you by your health care provider. Make sure you discuss any questions you have with your health care provider. Document Revised: 11/29/2018 Document Reviewed: 03/08/2018 Elsevier Patient Education  2021 Elsevier Inc.  

## 2020-06-13 NOTE — Procedures (Signed)
PreOperative Dx: Cirrhosis, ascites Postoperative Dx: Cirrhosis, ascites Procedure:   US guided paracentesis Radiologist:  Thornton Papas Anesthesia:  10 ml of1% lidocaine Specimen:  3.1 L of clear yellow ascitic fluid EBL:   < 1 ml Complications: None

## 2020-06-14 LAB — CYTOLOGY - NON PAP

## 2020-06-17 ENCOUNTER — Ambulatory Visit (HOSPITAL_COMMUNITY): Payer: Medicare Other

## 2020-06-18 ENCOUNTER — Ambulatory Visit (HOSPITAL_COMMUNITY): Payer: Medicare Other

## 2020-06-18 LAB — CULTURE, BODY FLUID W GRAM STAIN -BOTTLE: Culture: NO GROWTH

## 2020-06-20 ENCOUNTER — Other Ambulatory Visit: Payer: Medicare Other | Admitting: *Deleted

## 2020-06-20 NOTE — Patient Outreach (Addendum)
[name]) Care Management  06/20/2020  Jeffrey [name] [birth year] [id number]    Telephone Assessment-Successful-Diabetes  RN spoke with the spouse [name]) who provided an update on pt's ongoing management of care. Reports pt missed two days of dialysis and had to immediately go the following day due to precipitating symptoms. RN stressed the importance of attending dialysis if there is no change in his diet to prevent acute events. Caregiver reports pt is doing well otherwise with taking all his medications however had to reschedule some appointments recently. Reports pt has a lot of appointments on top of dialysis so several were rescheduled due to incremental weather. RN inquired on glucose readings. Wife reports they have been good at dialysis [name] however not aware of the readings on the other days. RN strongly encourage document all readings for providers including [facility] RN to obtain upon video calls/appointments as providers continue to assist pt in managing his care.   Plan of care reviewed and discussed with noted changes and updates. Will continue to encouraged adherence with all discussed and answer any inquires/question that maybe related. Will continue to update the primary provider accordingly with pt's disposition with [name] services. No other request at this time. Goals Addressed            This Visit's Progress   . THN-Learn more abont my health   On track    Follow Up Date 07/23/2020 Timeframe:  Long-Range Goal Priority:  Medium Start Date:    06/20/2020                         Expected End Date:    09/27/2020                   - make a list of questions - ask questions - repeat what I heard to make sure I understand - bring a list of my medicines to the visit - speak up when I don't understand    Why is this important?    The best way to learn about your health and care is by talking to the doctor and nurse.   They will answer your questions and give  you information in the way that you like best.    Notes:  1/20-Mange your diet, keep all appointments for diease related illness, follow all recommendations for treatment management and always utilize prevention measures to avoid acute encounters.  Will verbalize pt's understanding and verify with teach-back method.    [place] and Keep All Appointments   Not on track    Follow Up Date : 07/23/2020 Timeframe:  Long-Range Goal Priority:  Medium Start Date:     05/23/2020                        Expected End Date:        07/29/2020                 - call to cancel if needed - keep a calendar with prescription refill dates - keep a calendar with appointment dates    Why is this important?   Part of staying healthy is seeing the doctor for follow-up care.  If you forget your appointments, there are some things you can do to stay on track.    Notes:   1/20-Spouse reports several appointments were rescheduled due to incremental weather but verified. Pt missed two dialysis appointment however was able  to re-stabilize fluid removed via dialysis on another day. Will strongly encourage adherence with all medical appointments.  12/21-Verified pt continue to remain adherent with all dialysis and medical appointments scheduled. Will extend to allow ongoing adherence.    . COMPLETED: THN-Manage Constipation   On track    Follow Up Date 06/20/2020 Timeframe:  Short-Term Goal Priority:  Medium Start Date:       05/23/2020                      Expected End Date: 07/01/2020                        - be active every day - call the doctor or nurse if constipation doesn't get better - eat whole fruit instead of juice - increase the amount of dietary fiber in food - take a stool softener    Why is this important?   Eating foods that are high in iron can lead to hard, dry stool.  Taking iron pills or vitamins can also cause this to happen.  Making a few simple changes can help.    Notes: Will extend  and continue to encouraged adherence with his medication Lactulose.       Raina Mina, RN Care Management Coordinator [place] Office [phone]

## 2020-06-24 ENCOUNTER — Other Ambulatory Visit: Payer: Self-pay

## 2020-06-24 ENCOUNTER — Ambulatory Visit (HOSPITAL_COMMUNITY): Payer: Medicare Other

## 2020-06-24 DIAGNOSIS — K7031 Alcoholic cirrhosis of liver with ascites: Secondary | ICD-10-CM

## 2020-06-24 NOTE — Telephone Encounter (Signed)
Please advise for albumin?

## 2020-06-24 NOTE — Telephone Encounter (Signed)
Para scheduled for 06/27/20 at 9:00am, arrive at 8:45am.  Called and informed pt's of wife of para appt.

## 2020-06-25 ENCOUNTER — Telehealth: Payer: Self-pay | Admitting: Gastroenterology

## 2020-06-25 ENCOUNTER — Other Ambulatory Visit: Payer: Self-pay | Admitting: Gastroenterology

## 2020-06-25 ENCOUNTER — Inpatient Hospital Stay (HOSPITAL_COMMUNITY): Payer: Medicare Other | Attending: Hematology

## 2020-06-25 VITALS — BP 122/48 | HR 76 | Temp 96.8°F | Resp 18

## 2020-06-25 DIAGNOSIS — Z79899 Other long term (current) drug therapy: Secondary | ICD-10-CM | POA: Insufficient documentation

## 2020-06-25 DIAGNOSIS — K625 Hemorrhage of anus and rectum: Secondary | ICD-10-CM

## 2020-06-25 DIAGNOSIS — R300 Dysuria: Secondary | ICD-10-CM

## 2020-06-25 DIAGNOSIS — D509 Iron deficiency anemia, unspecified: Secondary | ICD-10-CM | POA: Diagnosis present

## 2020-06-25 DIAGNOSIS — D500 Iron deficiency anemia secondary to blood loss (chronic): Secondary | ICD-10-CM

## 2020-06-25 DIAGNOSIS — D62 Acute posthemorrhagic anemia: Secondary | ICD-10-CM

## 2020-06-25 DIAGNOSIS — D649 Anemia, unspecified: Secondary | ICD-10-CM

## 2020-06-25 MED ORDER — SODIUM CHLORIDE 0.9 % IV SOLN: Freq: Once | INTRAVENOUS | Status: DC

## 2020-06-25 MED ORDER — SODIUM CHLORIDE 0.9 % IV SOLN
300.0000 mg | Freq: Once | INTRAVENOUS | Status: DC
  Filled 2020-06-25: qty 15

## 2020-06-25 MED ORDER — SODIUM CHLORIDE 0.9 % IV SOLN
200.0000 mg | Freq: Once | INTRAVENOUS | Status: AC
  Administered 2020-06-25: 200 mg via INTRAVENOUS
  Filled 2020-06-25: qty 200

## 2020-06-25 MED ORDER — SODIUM CHLORIDE 0.9 % IV SOLN: 510.0000 mg | Freq: Once | INTRAVENOUS | Status: DC

## 2020-06-25 MED ORDER — SODIUM CHLORIDE 0.9 % IV SOLN: Freq: Once | INTRAVENOUS | Status: AC

## 2020-06-25 NOTE — Telephone Encounter (Signed)
Received my chart message stating patient was having rectal bleeding after taking the second dose of lactulose 45 mL.  Called to speak with patient's wife today.  States this is similar to the rectal bleeding he had back in October which resolved with anusol cream.  He developed rectal bleeding on Saturday after taking a second dose of lactulose.  States the first bowel movement he had did not have any bright red blood, but the second bowel movement did.  He also passed a couple of clots.  He had 1 or 2 episodes of rectal bleeding on Saturday and has not had any rectal bleeding since then. No rectal pain. Used anusol cream x1 on Saturday. He had 1 BM yesterday and 2 BMs this morning with no obvious rectal bleeding.  Patient's wife states that he feels he may have a bladder infection as he had some burning and is requesting a urinalysis.  He is going to [facility] today to receive iron infusion around 2 PM.  Suspect possible hemorrhoidal bleed in the setting of frequent BMs.   We will plan for CBC, UA, Urine Culture. I have also advised he resume anusol cream BID x10 days as symptoms improved with anusol in October.   Alicia: I have placed orders for UA, urine culture, and CBC.  Patient will have these completed today at [facility].  Please fax orders.

## 2020-06-25 NOTE — Telephone Encounter (Signed)
This has been addressed. See separate telephone note 06/26/19.

## 2020-06-25 NOTE — Progress Notes (Signed)
Jeffrey [name] presents today for IV iron infusion. Infusion tolerated without incident or complaint. See MAR for details. VSS prior to and post infusion. Patient observed for 30 minutes post infusion and was discharged in satisfactory condition with follow up instructions.

## 2020-06-25 NOTE — Patient Instructions (Signed)
[facility]  Discharge Instructions:  IV iron received today. _______________________________________________________________  Thank you for choosing [facility] at [facility] to provide your oncology and hematology care.  To afford each patient quality time with our providers, please arrive at least 15 minutes before your scheduled appointment.  You need to re-schedule your appointment if you arrive 10 or more minutes late.  We strive to give you quality time with our providers, and arriving late affects you and other patients whose appointments are after yours.  Also, if you no show three or more times for appointments you may be dismissed from the clinic.  Again, thank you for choosing [facility] at [facility]. Our hope is that these requests will allow you access to exceptional care and in a timely manner. _______________________________________________________________  If you have questions after your visit, please contact our office at [phone] between the hours of 8:30 a.m. and 5:00 p.m. Voicemails left after 4:30 p.m. will not be returned until the following business day. _______________________________________________________________  For prescription refill requests, have your pharmacy contact our office. _______________________________________________________________  Recommendations made by the consultant and any test results will be sent to your referring physician. _______________________________________________________________ 

## 2020-06-25 NOTE — Telephone Encounter (Signed)
Noted. Orders faxed to [facility] per [name], PA.

## 2020-06-27 ENCOUNTER — Encounter (HOSPITAL_COMMUNITY): Payer: Self-pay

## 2020-06-27 ENCOUNTER — Telehealth: Payer: Self-pay | Admitting: Gastroenterology

## 2020-06-27 ENCOUNTER — Ambulatory Visit (HOSPITAL_COMMUNITY)
Admission: RE | Admit: 2020-06-27 | Discharge: 2020-06-27 | Disposition: A | Discharge status: Home / Self Care | Payer: Medicare Other | Source: Ambulatory Visit | Attending: Gastroenterology | Admitting: Gastroenterology

## 2020-06-27 ENCOUNTER — Other Ambulatory Visit: Payer: Self-pay

## 2020-06-27 DIAGNOSIS — K7031 Alcoholic cirrhosis of liver with ascites: Secondary | ICD-10-CM | POA: Diagnosis present

## 2020-06-27 LAB — BODY FLUID CELL COUNT WITH DIFFERENTIAL
Lymphs, Fluid: 77 %
Monocyte-Macrophage-Serous Fluid: 19 % — ABNORMAL LOW (ref 50–90)
Neutrophil Count, Fluid: 4 % (ref 0–25)
Other Cells, Fluid: REACTIVE %
Total Nucleated Cell Count, Fluid: 117 cu mm (ref 0–1000)

## 2020-06-27 LAB — GRAM STAIN

## 2020-06-27 MED ORDER — ALBUMIN HUMAN 25 % IV SOLN
0.0000 g | Freq: Once | INTRAVENOUS | Status: DC
  Filled 2020-06-27: qty 400

## 2020-06-27 NOTE — Telephone Encounter (Signed)
The Cancer Center's labs usually result in epic as well. Can you reach out to the cancer center to see if they have any lab or urinalysis/urine culture results there. If not, unfortunately, we will need to arrange for labs to be completed again.

## 2020-06-27 NOTE — Procedures (Signed)
   US guided LLQ paracentesis 2.4 L pale yellow fluid obtained Sent for labs per MD  Tolerated well  EBL: none

## 2020-06-27 NOTE — Discharge Instructions (Signed)
Paracentesis, Care After This sheet gives you information about how to care for yourself after your procedure. Your health care provider may also give you more specific instructions. If you have problems or questions, contact your health care provider. What can I expect after the procedure? After the procedure, it is common to have a small amount of clear fluid coming from the puncture site. Follow these instructions at home: Puncture site care  Follow instructions from your health care provider about how to take care of your puncture site. Make sure you: ? Wash your hands with soap and water before and after you change your bandage (dressing). If soap and water are not available, use hand sanitizer. ? Change your dressing as told by your health care provider.  Check your puncture area every day for signs of infection. Check for: ? Redness, swelling, or pain. ? More fluid or blood. ? Warmth. ? Pus or a bad smell.   General instructions  Return to your normal activities as told by your health care provider. Ask your health care provider what activities are safe for you.  Take over-the-counter and prescription medicines only as told by your health care provider.  Do not take baths, swim, or use a hot tub until your health care provider approves. Ask your health care provider if you may take showers. You may only be allowed to take sponge baths.  Keep all follow-up visits as told by your health care provider. This is important. Contact a health care provider if:  You have redness, swelling, or pain at your puncture site.  You have more fluid or blood coming from your puncture site.  Your puncture site feels warm to the touch.  You have pus or a bad smell coming from your puncture site.  You have a fever. Get help right away if:  You have chest pain or shortness of breath.  You develop increasing pain, discomfort, or swelling in your abdomen.  You feel dizzy or light-headed or  you faint. Summary  After the procedure, it is common to have a small amount of clear fluid coming from the puncture site.  Follow instructions from your health care provider about how to take care of your puncture site.  Check your puncture area every day signs of infection.  Keep all follow-up visits as told by your health care provider. This information is not intended to replace advice given to you by your health care provider. Make sure you discuss any questions you have with your health care provider. Document Revised: 11/29/2018 Document Reviewed: 03/08/2018 Elsevier Patient Education  2021 Elsevier Inc.  

## 2020-06-27 NOTE — Telephone Encounter (Signed)
Jeffrey [name], I received a patient message today from this patient asking about lab results we ordered for 1/25. I do not see any results, but patient reports he had blood work completed and submitted a urine sample at [facility] prior to receiving his iron infusion on 1/25. Can you please call the lab and follow-up on this?

## 2020-06-27 NOTE — Sedation Documentation (Signed)
Pt tolerated left sided paracentesis procedure well today and 2.4 liters of clear yellow fluid removed with fluid collected and sent to lab for processing. PT vital signs stable at completion of procedure and denied any complaints.

## 2020-06-27 NOTE — Telephone Encounter (Signed)
Spoke with [facility] lab. They didn't see any collection for the lab or urine that our office requested. I spoke with pts spouse and she said it was done at the cancer center and she explained that labs needed to be completed for our office. She did say that urine was taken from pts cath bag and I think what was taken was for the para. The Lab the only thing they are seeing is what was done for the para today.

## 2020-06-28 LAB — PATHOLOGIST SMEAR REVIEW

## 2020-06-28 NOTE — Telephone Encounter (Signed)
I tried reaching the cancer center yesterday and today with no answer from any staff. I've left a VM. I spoke with pts spouse and she is going to take pt to the [facility] lab on 07/02/20 to complete the labs and urine culture needed. Orders were faxed to [facility] a second time today.

## 2020-06-28 NOTE — Telephone Encounter (Signed)
Noted! Thank you

## 2020-07-02 ENCOUNTER — Other Ambulatory Visit: Payer: Self-pay

## 2020-07-02 ENCOUNTER — Inpatient Hospital Stay (HOSPITAL_COMMUNITY): Payer: Medicare Other | Attending: Hematology

## 2020-07-02 VITALS — BP 144/66 | HR 75 | Temp 97.1°F | Resp 18

## 2020-07-02 DIAGNOSIS — D649 Anemia, unspecified: Secondary | ICD-10-CM

## 2020-07-02 DIAGNOSIS — D500 Iron deficiency anemia secondary to blood loss (chronic): Secondary | ICD-10-CM

## 2020-07-02 DIAGNOSIS — D62 Acute posthemorrhagic anemia: Secondary | ICD-10-CM

## 2020-07-02 DIAGNOSIS — N186 End stage renal disease: Secondary | ICD-10-CM | POA: Diagnosis present

## 2020-07-02 DIAGNOSIS — D631 Anemia in chronic kidney disease: Secondary | ICD-10-CM | POA: Insufficient documentation

## 2020-07-02 LAB — CULTURE, BODY FLUID W GRAM STAIN -BOTTLE: Culture: NO GROWTH

## 2020-07-02 MED ORDER — SODIUM CHLORIDE 0.9 % IV SOLN
300.0000 mg | Freq: Once | INTRAVENOUS | Status: AC
  Administered 2020-07-02: 300 mg via INTRAVENOUS
  Filled 2020-07-02: qty 15

## 2020-07-02 MED ORDER — SODIUM CHLORIDE 0.9 % IV SOLN: Freq: Once | INTRAVENOUS | Status: AC

## 2020-07-02 NOTE — Telephone Encounter (Signed)
Noted. If he is having persistent trouble with rectal bleeding he needs to have labs completed ASAP. If he has any profuse rectal bleeding, he should proceed to the emergency room.

## 2020-07-02 NOTE — Patient Instructions (Signed)
[facility] Discharge Instructions for Patients Receiving Chemotherapy  Today you received the following chemotherapy agents   To help prevent nausea and vomiting after your treatment, we encourage you to take your nausea medication   If you develop nausea and vomiting that is not controlled by your nausea medication, call the clinic.   BELOW ARE SYMPTOMS THAT SHOULD BE REPORTED IMMEDIATELY:  *FEVER GREATER THAN 100.5 F  *CHILLS WITH OR WITHOUT FEVER  NAUSEA AND VOMITING THAT IS NOT CONTROLLED WITH YOUR NAUSEA MEDICATION  *UNUSUAL SHORTNESS OF BREATH  *UNUSUAL BRUISING OR BLEEDING  TENDERNESS IN MOUTH AND THROAT WITH OR WITHOUT PRESENCE OF ULCERS  *URINARY PROBLEMS  *BOWEL PROBLEMS  UNUSUAL RASH Items with * indicate a potential emergency and should be followed up as soon as possible.  Feel free to call the clinic should you have any questions or concerns. The clinic phone number is [phone].  Please show the CHEMO ALERT CARD at check-in to the Emergency Department and triage nurse.   

## 2020-07-02 NOTE — Telephone Encounter (Signed)
mychart message was received today 07/02/20. I called pts spouse and they are at the cancer center at [place]. She said pts blood was drawn there and I spoke with pts nurse. She is aware that labs were placed and pt needs to go to [facility] to complete labs. Pt's spouse states pt isn't able to get blood drawn due to everything he's been through today and they will have labs completed at [facility] next week. I explained that labs were requested previously and are important. Per pts spouse pt will not go today to get labs done. It will be completed next week.

## 2020-07-02 NOTE — Progress Notes (Unsigned)
Patient presents today for Venofer infusion.  Vital signs WNL.  No new complaints since last visit.  Venofer given today per MD orders.  Tolerated infusion without adverse affects.  Vital signs stable.  No complaints at this time.  Discharge from clinic via wheelchair in stable condition.  Alert and oriented X 3.  Follow up with [facility] as scheduled.

## 2020-07-03 NOTE — Telephone Encounter (Signed)
Noted. Spouse is aware [name]'s PA recommendations.

## 2020-07-08 ENCOUNTER — Telehealth: Payer: Self-pay | Admitting: Gastroenterology

## 2020-07-08 NOTE — Telephone Encounter (Signed)
Jeffrey [name], I had requested patient follow-up with Dr. Gala Romney in 8 weeks at his last visit. I do not see an appointment scheduled.   Please arrange OV with Dr. Gala Romney on March 1st at 3:00 pm or 3:30 pm. Dx: Cirrhosis, rectal bleeding, GERD

## 2020-07-09 ENCOUNTER — Other Ambulatory Visit: Payer: Self-pay

## 2020-07-09 ENCOUNTER — Other Ambulatory Visit (HOSPITAL_COMMUNITY)
Admission: RE | Admit: 2020-07-09 | Discharge: 2020-07-09 | Disposition: A | Discharge status: Home / Self Care | Payer: Medicare Other | Source: Ambulatory Visit | Attending: Gastroenterology | Admitting: Gastroenterology

## 2020-07-09 ENCOUNTER — Encounter (HOSPITAL_COMMUNITY): Payer: Self-pay

## 2020-07-09 ENCOUNTER — Ambulatory Visit (INDEPENDENT_AMBULATORY_CARE_PROVIDER_SITE_OTHER): Payer: Medicare Other

## 2020-07-09 ENCOUNTER — Inpatient Hospital Stay (HOSPITAL_COMMUNITY): Payer: Medicare Other

## 2020-07-09 ENCOUNTER — Encounter: Payer: Self-pay | Admitting: Internal Medicine

## 2020-07-09 VITALS — BP 138/71 | HR 76 | Temp 97.4°F | Resp 18

## 2020-07-09 DIAGNOSIS — D500 Iron deficiency anemia secondary to blood loss (chronic): Secondary | ICD-10-CM

## 2020-07-09 DIAGNOSIS — N186 End stage renal disease: Secondary | ICD-10-CM | POA: Diagnosis not present

## 2020-07-09 DIAGNOSIS — R300 Dysuria: Secondary | ICD-10-CM | POA: Diagnosis present

## 2020-07-09 DIAGNOSIS — R339 Retention of urine, unspecified: Secondary | ICD-10-CM

## 2020-07-09 DIAGNOSIS — D62 Acute posthemorrhagic anemia: Secondary | ICD-10-CM

## 2020-07-09 DIAGNOSIS — K7031 Alcoholic cirrhosis of liver with ascites: Secondary | ICD-10-CM

## 2020-07-09 DIAGNOSIS — D649 Anemia, unspecified: Secondary | ICD-10-CM

## 2020-07-09 LAB — URINALYSIS, ROUTINE W REFLEX MICROSCOPIC
Bilirubin Urine: NEGATIVE
Glucose, UA: 50 mg/dL — AB
Ketones, ur: NEGATIVE mg/dL
Nitrite: NEGATIVE
Protein, ur: 30 mg/dL — AB
Specific Gravity, Urine: 1.005 (ref 1.005–1.030)
WBC, UA: >50 WBC/hpf — ABNORMAL HIGH (ref 0–5)
pH: 7 (ref 5.0–8.0)

## 2020-07-09 LAB — CBC
HCT: 25 % — ABNORMAL LOW (ref 39.0–52.0)
Hemoglobin: 7.5 g/dL — ABNORMAL LOW (ref 13.0–17.0)
MCH: 31.6 pg (ref 26.0–34.0)
MCHC: 30 g/dL (ref 30.0–36.0)
MCV: 105.5 fL — ABNORMAL HIGH (ref 80.0–100.0)
Platelets: 86 10*3/uL — ABNORMAL LOW (ref 150–400)
RBC: 2.37 MIL/uL — ABNORMAL LOW (ref 4.22–5.81)
RDW: 16.1 % — ABNORMAL HIGH (ref 11.5–15.5)
WBC: 3.3 10*3/uL — ABNORMAL LOW (ref 4.0–10.5)
nRBC: 0 % (ref 0.0–0.2)

## 2020-07-09 MED ORDER — SODIUM CHLORIDE 0.9 % IV SOLN
300.0000 mg | Freq: Once | INTRAVENOUS | Status: AC
  Administered 2020-07-09: 300 mg via INTRAVENOUS
  Filled 2020-07-09: qty 15

## 2020-07-09 MED ORDER — SODIUM CHLORIDE 0.9 % IV SOLN: Freq: Once | INTRAVENOUS | Status: AC

## 2020-07-09 NOTE — Telephone Encounter (Signed)
Noted  

## 2020-07-09 NOTE — Patient Instructions (Signed)

## 2020-07-09 NOTE — Telephone Encounter (Signed)
He was on the recall and was sent a letter, we did not have the doctors schedule at his last visit.  I will schedule him today and mail a letter.

## 2020-07-09 NOTE — Progress Notes (Signed)
Patient presents today for iron infusion. MAR reviewed and updated. Patient denies any significant changes since last visit.   Treatment given today per MD orders. Tolerated infusion without adverse affects. Vital signs stable. No complaints at this time. Discharged from clinic via wheel chair in stable condition. Alert and oriented x 3. F/U with [facility] as scheduled.

## 2020-07-09 NOTE — Progress Notes (Signed)
Cath Change/ Replacement  Patient is present today for a catheter change due to urinary retention.  74ml of water was removed from the balloon, a 16FR foley cath was removed with out difficulty.  Patient was cleaned and prepped in a sterile fashion with betadine. A 16 FR foley cath was replaced into the bladder no complications were noted Urine return was noted 28ml and urine was yellow in color. The balloon was filled with 56ml of sterile water. A leg bag was attached for drainage. Patient was given proper instruction on catheter care.    Performed by: Sabrine Patchen, lpn  Follow up: Keep next scheduled NV

## 2020-07-09 NOTE — Telephone Encounter (Signed)
Para scheduled for 07/11/20, arrive at 1:00pm.  Called and informed wife of para appt.

## 2020-07-09 NOTE — Patient Instructions (Signed)
[facility]  Discharge Instructions:   _______________________________________________________________  Thank you for choosing [facility] at [facility] to provide your oncology and hematology care.  To afford each patient quality time with our providers, please arrive at least 15 minutes before your scheduled appointment.  You need to re-schedule your appointment if you arrive 10 or more minutes late.  We strive to give you quality time with our providers, and arriving late affects you and other patients whose appointments are after yours.  Also, if you no show three or more times for appointments you may be dismissed from the clinic.  Again, thank you for choosing [facility]. Our hope is that these requests will allow you access to exceptional care and in a timely manner. _______________________________________________________________  If you have questions after your visit, please contact our office at [phone] between the hours of 8:30 a.m. and 5:00 p.m. Voicemails left after 4:30 p.m. will not be returned until the following business day. _______________________________________________________________  For prescription refill requests, have your pharmacy contact our office. _______________________________________________________________  Recommendations made by the consultant and any test results will be sent to your referring physician. _______________________________________________________________ 

## 2020-07-10 ENCOUNTER — Other Ambulatory Visit: Payer: Self-pay

## 2020-07-10 DIAGNOSIS — K7031 Alcoholic cirrhosis of liver with ascites: Secondary | ICD-10-CM

## 2020-07-11 ENCOUNTER — Other Ambulatory Visit: Payer: Self-pay

## 2020-07-11 ENCOUNTER — Telehealth: Payer: Self-pay | Admitting: Internal Medicine

## 2020-07-11 ENCOUNTER — Encounter (HOSPITAL_COMMUNITY): Payer: Self-pay

## 2020-07-11 ENCOUNTER — Telehealth: Payer: Self-pay | Admitting: Gastroenterology

## 2020-07-11 ENCOUNTER — Other Ambulatory Visit (HOSPITAL_COMMUNITY)
Admission: RE | Admit: 2020-07-11 | Discharge: 2020-07-11 | Disposition: A | Discharge status: Home / Self Care | Payer: Medicare Other | Source: Ambulatory Visit | Attending: Gastroenterology | Admitting: Gastroenterology

## 2020-07-11 ENCOUNTER — Ambulatory Visit (HOSPITAL_COMMUNITY)
Admission: RE | Admit: 2020-07-11 | Discharge: 2020-07-11 | Disposition: A | Discharge status: Home / Self Care | Payer: Medicare Other | Source: Ambulatory Visit | Attending: Gastroenterology | Admitting: Gastroenterology

## 2020-07-11 DIAGNOSIS — D649 Anemia, unspecified: Secondary | ICD-10-CM | POA: Insufficient documentation

## 2020-07-11 DIAGNOSIS — K625 Hemorrhage of anus and rectum: Secondary | ICD-10-CM | POA: Insufficient documentation

## 2020-07-11 DIAGNOSIS — K7031 Alcoholic cirrhosis of liver with ascites: Secondary | ICD-10-CM

## 2020-07-11 DIAGNOSIS — N186 End stage renal disease: Secondary | ICD-10-CM | POA: Diagnosis not present

## 2020-07-11 LAB — BODY FLUID CELL COUNT WITH DIFFERENTIAL
Lymphs, Fluid: 74 %
Monocyte-Macrophage-Serous Fluid: 24 % — ABNORMAL LOW (ref 50–90)
Neutrophil Count, Fluid: 2 % (ref 0–25)
Total Nucleated Cell Count, Fluid: 141 cu mm (ref 0–1000)

## 2020-07-11 LAB — GRAM STAIN: Gram Stain: NONE SEEN

## 2020-07-11 LAB — CBC WITH DIFFERENTIAL/PLATELET
Abs Immature Granulocytes: 0 10*3/uL (ref 0.00–0.07)
Basophils Absolute: 0 10*3/uL (ref 0.0–0.1)
Basophils Relative: 1 %
Eosinophils Absolute: 0.1 10*3/uL (ref 0.0–0.5)
Eosinophils Relative: 2 %
HCT: 23 % — ABNORMAL LOW (ref 39.0–52.0)
Hemoglobin: 7.1 g/dL — ABNORMAL LOW (ref 13.0–17.0)
Immature Granulocytes: 0 %
Lymphocytes Relative: 13 %
Lymphs Abs: 0.4 10*3/uL — ABNORMAL LOW (ref 0.7–4.0)
MCH: 32.6 pg (ref 26.0–34.0)
MCHC: 30.9 g/dL (ref 30.0–36.0)
MCV: 105.5 fL — ABNORMAL HIGH (ref 80.0–100.0)
Monocytes Absolute: 0.2 10*3/uL (ref 0.1–1.0)
Monocytes Relative: 8 %
Neutro Abs: 2.4 10*3/uL (ref 1.7–7.7)
Neutrophils Relative %: 76 %
Platelets: 125 10*3/uL — ABNORMAL LOW (ref 150–400)
RBC: 2.18 MIL/uL — ABNORMAL LOW (ref 4.22–5.81)
RDW: 16.7 % — ABNORMAL HIGH (ref 11.5–15.5)
WBC: 3.1 10*3/uL — ABNORMAL LOW (ref 4.0–10.5)
nRBC: 0 % (ref 0.0–0.2)

## 2020-07-11 LAB — BODY FLUID CULTURE

## 2020-07-11 LAB — PROTIME-INR
INR: 1.2 (ref 0.8–1.2)
Prothrombin Time: 14.4 seconds (ref 11.4–15.2)

## 2020-07-11 MED ORDER — ALBUMIN HUMAN 25 % IV SOLN
0.0000 g | Freq: Once | INTRAVENOUS | Status: DC
  Filled 2020-07-11: qty 400

## 2020-07-11 NOTE — Telephone Encounter (Signed)
Noted. Labs faxed to [facility].

## 2020-07-11 NOTE — Procedures (Signed)
   US guided LLQ paracentesis  3.7 L yellow fluid Sent for labs per MD  Tolerated well  EBL None

## 2020-07-11 NOTE — Sedation Documentation (Signed)
PT tolerated paracentesis procedure well today and 3.7 liters of clear yellow fluid removed with labs collected and sent for processing. PT vital signs remained stable at completion of procedure and pt verbalized understanding of d/c instructions.

## 2020-07-11 NOTE — Telephone Encounter (Signed)
Received patient message this morning that patient had some rectal bleeding and dark stool. Called wife back around 11am. Wife explained that patient had dark stool with bright red blood in the toilet water. The red water made it difficult to see the stool itself. Patient is on oral iron nightly and wife can't tell be if this is what his stool usually looks like as she wasn't able to see it well. She was not at home but was returning home with the next 30 minutes. She was unaware if patient has had any further rectal bleeding this morning.   I have recommended CBC and INR today. Requested she call the office back once she get home to let me know if patient has had any further bleeding episodes. If he has any persistent bleeding or black stools, recommended proceeding to the emergency room immediately.   Alicia: I have placed orders for CBC and INR. Please fax to [facility].

## 2020-07-11 NOTE — Telephone Encounter (Signed)
Patient wife called and said that he has not had a bowel movement and he is not bleeding.  Said they are on the way to [facility] for labs

## 2020-07-11 NOTE — Discharge Instructions (Signed)
Paracentesis, Care After This sheet gives you information about how to care for yourself after your procedure. Your health care provider may also give you more specific instructions. If you have problems or questions, contact your health care provider. What can I expect after the procedure? After the procedure, it is common to have a small amount of clear fluid coming from the puncture site. Follow these instructions at home: Puncture site care  Follow instructions from your health care provider about how to take care of your puncture site. Make sure you: ? Wash your hands with soap and water before and after you change your bandage (dressing). If soap and water are not available, use hand sanitizer. ? Change your dressing as told by your health care provider.  Check your puncture area every day for signs of infection. Check for: ? Redness, swelling, or pain. ? More fluid or blood. ? Warmth. ? Pus or a bad smell.   General instructions  Return to your normal activities as told by your health care provider. Ask your health care provider what activities are safe for you.  Take over-the-counter and prescription medicines only as told by your health care provider.  Do not take baths, swim, or use a hot tub until your health care provider approves. Ask your health care provider if you may take showers. You may only be allowed to take sponge baths.  Keep all follow-up visits as told by your health care provider. This is important. Contact a health care provider if:  You have redness, swelling, or pain at your puncture site.  You have more fluid or blood coming from your puncture site.  Your puncture site feels warm to the touch.  You have pus or a bad smell coming from your puncture site.  You have a fever. Get help right away if:  You have chest pain or shortness of breath.  You develop increasing pain, discomfort, or swelling in your abdomen.  You feel dizzy or light-headed or  you faint. Summary  After the procedure, it is common to have a small amount of clear fluid coming from the puncture site.  Follow instructions from your health care provider about how to take care of your puncture site.  Check your puncture area every day signs of infection.  Keep all follow-up visits as told by your health care provider. This information is not intended to replace advice given to you by your health care provider. Make sure you discuss any questions you have with your health care provider. Document Revised: 11/29/2018 Document Reviewed: 03/08/2018 Elsevier Patient Education  2021 Elsevier Inc.  

## 2020-07-11 NOTE — Telephone Encounter (Signed)
Noted. Will wait on lab work for additional recommendations.

## 2020-07-12 ENCOUNTER — Other Ambulatory Visit (HOSPITAL_COMMUNITY): Payer: Self-pay | Admitting: *Deleted

## 2020-07-12 DIAGNOSIS — D62 Acute posthemorrhagic anemia: Secondary | ICD-10-CM

## 2020-07-12 LAB — PATHOLOGIST SMEAR REVIEW

## 2020-07-12 LAB — URINE CULTURE

## 2020-07-12 NOTE — Progress Notes (Signed)
We received a call from [facility], nurse Lars Mage, about patient's recent labs. His hgb is around 7 and they want patient to come for labs and possible blood transfusion.  Patient has dialysis today and Monday.  He was instructed by [name], PA at [facility] to report to ER if his symptoms got worse over the weekend.  We will see him in clinic on Tuesday for possible blood transfusion.  Our scheduler will call patient with next weeks appts.

## 2020-07-15 ENCOUNTER — Other Ambulatory Visit: Payer: Self-pay

## 2020-07-15 DIAGNOSIS — D649 Anemia, unspecified: Secondary | ICD-10-CM

## 2020-07-16 ENCOUNTER — Telehealth (HOSPITAL_COMMUNITY): Payer: Self-pay | Admitting: Surgery

## 2020-07-16 ENCOUNTER — Other Ambulatory Visit: Payer: Self-pay | Admitting: Internal Medicine

## 2020-07-16 ENCOUNTER — Inpatient Hospital Stay (HOSPITAL_COMMUNITY): Payer: Medicare Other

## 2020-07-16 ENCOUNTER — Encounter (HOSPITAL_COMMUNITY): Payer: Medicare Other

## 2020-07-16 LAB — CULTURE, BODY FLUID W GRAM STAIN -BOTTLE: Culture: NO GROWTH

## 2020-07-16 NOTE — Telephone Encounter (Signed)
I called Jeffrey [name] to find out the results of the pt's CBC and if Jeffrey [name] still wanted the pt to have a blood transfusion.  Per Jeffrey [name], the pt's hemoglobin was 7 and Jeffrey [name] wants to wait to give the pt a transfusion.  The pt will have another CBC next Monday.  Jene Every, AD, was notified of this plan.

## 2020-07-16 NOTE — Telephone Encounter (Signed)
I called and spoke to the pt's wife to make sure they had been notified of the pt's hemoglobin level and that the labs would be drawn again next Monday.  The pt's wife stated that Johny Shears from [facility] had already called them and that they were aware of the plan.

## 2020-07-23 ENCOUNTER — Other Ambulatory Visit: Payer: Self-pay | Admitting: *Deleted

## 2020-07-23 ENCOUNTER — Telehealth: Payer: Self-pay

## 2020-07-23 NOTE — Telephone Encounter (Signed)
Noted. Please have patient complete 1 more H/H on 2/28 so results are available on 3/1 for Dr. Gala Romney at the time of patient's office visit.

## 2020-07-23 NOTE — Telephone Encounter (Signed)
Noted. Hemoglobin is stable/improving. No need for transfusion. Any further rectal bleeding?

## 2020-07-23 NOTE — Telephone Encounter (Signed)
Spoke with pts spouse. Pt hasn't had any new rectal bleeding and plans to follow up with Dr. Gala Romney on 07/30/2020.

## 2020-07-23 NOTE — Telephone Encounter (Signed)
Lmom, waiting on a return call.  

## 2020-07-23 NOTE — Patient Outreach (Signed)
Jeffrey [name] [facility]) Care Management  07/23/2020  Jeffrey [name] 1945/11/15 [id number]    Telephone Assessment-Successful-Diabetes   RN spoke with pt today and received an update on his ongoing management of care. Pt mention many things that were added or noted into the review and discussed plan of care today. Pt is doing well with his ongoing management of care however some elevated glucose readings that maybe related to some ongoing stressors. Pt mentioned losing his strength and muscle that may affect his image related to his wife. Pt aware of how to exercise to maintain this appearance however feels he is to weak. Pt very mobile with dialysis 3 days weekly and has several appointments in between those days but feels it's not enough. PT has always been an option but outpatient therapy would be an expense pt can not afford at this time and he is not home bound for HHPT.   Plan of care review and updated accordingly based upon today's discussion. Will continue to encouraged adherence with managing his diabetes management of care and follow up next month. Strongly encourage to re scheduled his eye and foot appointments due to his diabetes. Will also update pt's provider on his ongoing progress and disposition with [facility].  Goals Addressed            This Visit's Progress   . [facility]-Disease Progression Prevented or Minimized   On track    Follow up date: 08/20/2020 Timeframe:  Short-Term Goal Priority:  Medium Start Date:     07/23/2020                        Expected End Date:  08/29/2020                      Evidence-based guidance:    Prepare patient for laboratory tests, such as lipid profile, microalbuminuria, estimated glomerular filtration rate and diagnostic exams based on risk and presentation.   Assess signs/symptoms and risk factors for hypertension, dyslipidemia, nephropathy, neuropathy and retinopathy.   Encourage lifestyle changes, such as increased intake of plant-based  foods, stress reduction, regular physical activity and smoking cessation to prevent long-term complications and chronic diseases.   Discourage sedentary behavior or sitting longer than 30 minutes without interruption; individualize exercise or activity recommendations with potential limitations, such as neuropathy, retinopathy or the ability to prevent  hyperglycemia or hypoglycemia.   Ensure completion of annual comprehensive foot exam at start of puberty or age 31 years, whichever is earlier, once the child has had diabetes for 5 years.     Implement additional individualized goals and interventions based on identified risk factors.   Prepare patient for consultation or referral for specialist care, such as ophthalmology, neurology, cardiology, podiatry, nephrology or perinatology. All that imply related to pt's medical history.    Notes:  2/22-Will discuss all above and verified pt's has seen both his ophthalmologist and podiatry over the last year. Pt has been hospitalized with this years appointments and will rescheduled for his annuals. Stress the importance of completing this task. Verified no symptoms of hyper/hypoglycemia have occurred over the last month since the last conversation. Reports last Ac1 6.0 last week at the providers office and the last CBG read at 200. Aware this is elevated and RN case manager discuss ways to reduce this number. Pt has been lower in the past and has some stressors going on at this time.    . [facility]-Learn more  abont my health   On track    Follow Up Date 08/20/2020 Timeframe:  Long-Range Goal Priority:  Medium Start Date:    06/20/2020                         Expected End Date:    09/27/2020                   - make a list of questions - ask questions - repeat what I heard to make sure I understand - bring a list of my medicines to the visit - speak up when I don't understand    Why is this important?    The best way to learn about your health and care  is by talking to the doctor and nurse.   They will answer your questions and give you information in the way that you like best.    Notes:  1/20-Mange your diet, keep all appointments for diease related illness, follow all recommendations for treatment management and always utilize prevention measures to avoid acute encounters.  Will verbalize pt's understanding and verify with teach-back method.    Jeffrey [name] and Keep All Appointments   On track    Follow Up Date : 08/20/2020 Timeframe:  Long-Range Goal Priority:  Medium Start Date:     05/23/2020                        Expected End Date:        09/27/2020                 - call to cancel if needed - keep a calendar with prescription refill dates - keep a calendar with appointment dates    Why is this important?   Part of staying healthy is seeing the doctor for follow-up care.  If you forget your appointments, there are some things you can do to stay on track.    Notes:   1/20-Spouse reports several appointments were rescheduled due to incremental weather but verified. Pt missed two dialysis appointment however was able to re-stabilize fluid removed via dialysis on another day. Will strongly encourage adherence with all medical appointments.  12/21-Verified pt continue to remain adherent with all dialysis and medical appointments scheduled. Will extend to allow ongoing adherence.       Raina Mina, RN Care Management Coordinator [place] Office [phone]

## 2020-07-23 NOTE — Telephone Encounter (Signed)
Mychart message received from pts spouse. We received lab work a little while ago. Hem is 7.2. lab results have been placed on [employer] PA's desk.

## 2020-07-24 NOTE — Telephone Encounter (Signed)
Noted. Mychart message sent to pt. Waiting on a response from pts spouse about labs needed Monday 07/29/20.

## 2020-07-30 ENCOUNTER — Telehealth: Payer: Self-pay

## 2020-07-30 ENCOUNTER — Encounter: Payer: Self-pay | Admitting: Internal Medicine

## 2020-07-30 ENCOUNTER — Other Ambulatory Visit: Payer: Self-pay

## 2020-07-30 ENCOUNTER — Ambulatory Visit: Payer: Medicare Other | Admitting: Internal Medicine

## 2020-07-30 VITALS — BP 132/77 | HR 76 | Temp 96.9°F | Ht 67.0 in | Wt 216.0 lb

## 2020-07-30 DIAGNOSIS — K7031 Alcoholic cirrhosis of liver with ascites: Secondary | ICD-10-CM

## 2020-07-30 DIAGNOSIS — D649 Anemia, unspecified: Secondary | ICD-10-CM | POA: Diagnosis not present

## 2020-07-30 DIAGNOSIS — K7460 Unspecified cirrhosis of liver: Secondary | ICD-10-CM | POA: Diagnosis not present

## 2020-07-30 DIAGNOSIS — K922 Gastrointestinal hemorrhage, unspecified: Secondary | ICD-10-CM | POA: Diagnosis not present

## 2020-07-30 DIAGNOSIS — K219 Gastro-esophageal reflux disease without esophagitis: Secondary | ICD-10-CM

## 2020-07-30 LAB — BODY FLUID CULTURE W GRAM STAIN

## 2020-07-30 NOTE — Patient Instructions (Signed)
Continue Aldactone and Lasix without change in dosage  Continue lactulose and Xifaxan without change in dosage -goal with lactulose is 3 semiformed stools daily  Continue with a 2 g sodium restriction  I recommend you start taking omeprazole 40 mg once daily as the benefits for your quality of life outweigh the risks as discussed  Schedule a  large-volume paracentesis on March 3.  Standard fluid analysis per standing orders.  Lab work same day to include CBC, INR and Chem-12  Office visit with [name] in 4 weeks

## 2020-07-30 NOTE — Progress Notes (Signed)
Primary Care Physician:  Monico Blitz, MD Primary Gastroenterologist:  Dr. Gala Romney  Pre-Procedure History & Physical: HPI:  Jeffrey [name] is a 75 y.o. male here for follow-up of chronically decompensated end-stage EtOH related cirrhosis. Anasarca including ascites continues to be a challenge.  Last tapped 3 weeks ago-3.7 L removed.  No SBP.  He is watching his sodium.  He undergoes dialysis 3 times weekly.  He was states he is Levaquin 500 milligrams orally after each dialysis session. Has 1-4 bowel movements daily depending on dosing of lactulose.  He tends to back off on it if he is traveling.  He continues on Xifaxan.  States abdomen has become swollen again and he needs a tap.  Last hemoglobin is 7.1.  No further rectal bleeding no melena.  Has not had any fever or chills.  Wife says mental status good lately.  He and his wife relate that he is miserable at times with a significant daily reflux symptoms.  Taking omeprazole 40 mg 4 times weekly no dysphagia.  No alcohol since 2014.  Patient is visited at home by a palliative care nurse once monthly   Past Medical History:  Diagnosis Date  . A-fib [facility])    when initially stating dialysis january 2021 at [facility] per spouse; never heard anything else about it  . Alcoholic cirrhosis ([place])    patient reports completing hep a and b vaccines in 2006  . Anemia    has had 3 units of prbcs 2013  . Anxiety   . Arthritis   . Asymptomatic gallstones    Ultrasound in 2006  . C. difficile colitis   . Cholelithiasis   . Chronic kidney disease    Dialysis M/W/F/Sa  . Depression   . Diabetes ([place])   . Duodenal ulcer with hemorrhage    per patient in 2006 or 2007, records have been requested  . Dyspnea    low oxygen level -  has O2 2 L all day  . 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 GERD (gastroesophageal reflux disease)   . Heart burn   . 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 HOH (hard of hearing)   .  Hypertension   . Liver cirrhosis ([place])   . SBP (spontaneous bacterial peritonitis) ([place])    2020, November 2021.   [name] Splenomegaly    Ultrasound in 2006    Past Surgical History:  Procedure Laterality Date  . AGILE CAPSULE N/A 09/20/2019   Procedure: AGILE CAPSULE;  Surgeon: Daneil Dolin, MD;  [facility];  Service: Endoscopy;  Laterality: N/A;  . APPENDECTOMY    . AV FISTULA PLACEMENT Left 08/17/2019   Procedure: ARTERIOVENOUS (AV) FISTULA CREATION VERSES ARTERIOVENOUS GRAFT;  Surgeon: [name], MD;  Location: [place];  Service: Vascular;  Laterality: Left;  . BASCILIC VEIN TRANSPOSITION Left 09/28/2019   Procedure: LEFT SECOND STAGE BASCILIC VEIN TRANSPOSITION;  Surgeon: [name], MD;  Location: [place];  Service: Vascular;  Laterality: Left;  . BIOPSY  02/03/2018   Procedure: BIOPSY;  Surgeon: Daneil Dolin, MD;  [facility];  Service: Endoscopy;;  bx of gastric polyps  . BIOPSY  09/19/2019   Procedure: BIOPSY;  Surgeon: Danie Binder, MD;  [facility];  Service: Endoscopy;;  . CATARACT EXTRACTION Right   . COLONOSCOPY  03/10/2005   Rectal polyp as described above, removed with snare. Left sided  diverticula. The remainder of the colonic mucosa appeared normal. Inflammed polyp on path.  . COLONOSCOPY  04/1999   Dr. Thea Silversmith polyps removed,  Path showed hyperplastic  . COLONOSCOPY  10/2015   Dr. Britta Mccreedy: diverticulosis, single sessile tubular adenoma 3-86mm in size removed from descending colon.   . COLONOSCOPY WITH ESOPHAGOGASTRODUODENOSCOPY (EGD)  03/31/2012   EXB:MWUXLKG AVMs. Colonic diverticulosis. tubular adenoma colon  . COLONOSCOPY WITH ESOPHAGOGASTRODUODENOSCOPY (EGD)  06/2019   FORSYTH: small esophageal varices without high risk stigmata, single large AVM on lesser curvature in gastric body s/p APC ablation, gastric antral and duodenal bulb polyposis. No significant source to explain transfusion dependent anemia. Colonoscopy with  portal colopathy and diffuse edema, changes of Cdiff colitis on colonoscopy  . ESOPHAGEAL BANDING N/A 02/03/2018   Procedure: ESOPHAGEAL BANDING;  Surgeon: Daneil Dolin, MD;  [facility];  Service: Endoscopy;  Laterality: N/A;  . ESOPHAGOGASTRODUODENOSCOPY  01/15/2005   Three columns grade 1 to 2 esophageal varices, otherwise normal esophageal mucosa.  Esophagus was not manipulated otherwise./Nodularity of the antrum with overlying erosions, nonspecific finding. Path showed rare H.pylori  . ESOPHAGOGASTRODUODENOSCOPY  09/2008   Dr. Gaylene Brooks columns of grade 2-3 esoph varices, only one column was prominent. Portal gastropathy, multiple gastrc polyps at antrum, two were 2cm with black eschar, bulbar polyps, bulbar erosions  . ESOPHAGOGASTRODUODENOSCOPY  11/2004   Dr. Brantley Stage 3 esoph varices  . ESOPHAGOGASTRODUODENOSCOPY  03/31/2012   RMR: 4 columns(3-GR 2, 1-GR1) non-bleeding esophageal varices, portal gastropathy, small HH, early GAVE, multiple gastric polyps   . ESOPHAGOGASTRODUODENOSCOPY (EGD) WITH PROPOFOL N/A 02/03/2018   Dr. Gala Romney: Esophageal varices, 3 columns grade 1-2.  Portal hypertensive gastropathy.  Multiple gastric polyps, biopsy consistent with hyperplastic.  [name] ESOPHAGOGASTRODUODENOSCOPY (EGD) WITH PROPOFOL N/A 09/19/2019   Fields: grade I and II esophageal varcies, mild portal hypertensive gastropathy, moderate gastritis but no H. pylori, single hyperplastic gastric polyp removed, obvious source for melena/transfusion dependent anemia not identified, may be due to friable gastric and duodenal mucosa in the setting of portal hypertension  . GASTRIC VARICES BANDING  03/31/2012   Procedure: GASTRIC VARICES BANDING;  Surgeon: Daneil Dolin, MD;  [facility];  Service: Endoscopy;  Laterality: N/A;  . GIVENS CAPSULE STUDY N/A 09/21/2019   Poor study with a lot of debris obstructing much of the view of the first approximate 3 hours out of 6 hours.  No  obvious source of bleeding identified.  Sequela of bleeding and old blood seen in the form of"whisps" of blood, blood flecks mostly toward the end of the study.  . IR REMOVAL TUN CV CATH W/O FL  02/15/2020  . POLYPECTOMY  09/19/2019   Procedure: POLYPECTOMY;  Surgeon: Danie Binder, MD;  [facility];  Service: Endoscopy;;  . UMBILICAL HERNIA REPAIR  2017   exploratory laparotomy, abdominal washout    Prior to Admission medications   Medication Sig Start Date End Date Taking? Authorizing Provider  acetaminophen (TYLENOL) 500 MG tablet Take 1,000 mg by mouth as needed for mild pain.   Yes [provider]  Alcohol Swabs (ALCOHOL PREP) 70 % PADS Apply 1 each topically 3 (three) times daily. 01/31/20  Yes [provider]  Ascorbic Acid (VITAMIN C) 1000 MG tablet Take 1,000 mg by mouth 2 (two) times daily.   Yes [provider]  calcium carbonate (TUMS - DOSED IN MG ELEMENTAL CALCIUM) 500 MG chewable tablet Chew 1 tablet by mouth 3 (three) times daily with meals.  Yes [provider]  Calcium Carbonate-Simethicone (ALKA-SELTZER HEARTBURN + GAS) 750-80 MG CHEW Chew by mouth as needed.   Yes [provider]  diltiazem (CARDIZEM) 120 MG tablet Take by mouth. 03/20/20  Yes [provider]  diltiazem (TIAZAC) 120 MG 24 hr capsule Take 120 mg by mouth daily. 05/28/20  Yes [provider]  furosemide (LASIX) 40 MG tablet Take 40 mg by mouth 2 (two) times daily. 12/29/19  Yes [provider]  gabapentin (NEURONTIN) 100 MG capsule TAKE 1 CAPSULE BY MOUTH AT BEDTIME 05/28/20  Yes Waynetta Sandy, MD  hydrocortisone (ANUSOL-HC) 2.5 % rectal cream Place 1 application rectally 2 (two) times daily. Patient taking differently: Place 1 application rectally 2 (two) times daily as needed for hemorrhoids or anal itching. 03/27/20  Yes Erenest Rasher, PA-C  hydrOXYzine (VISTARIL) 25 MG capsule Take 25 mg by mouth 3 (three)  times daily as needed for itching.  01/23/20  Yes [provider]  insulin degludec (TRESIBA FLEXTOUCH) 200 UNIT/ML FlexTouch Pen Inject 120 Units into the skin at bedtime. When blood sugars are up Patient taking differently: Inject 120 Units into the skin at bedtime. 02/20/20  Yes Barton Dubois, MD  iron polysaccharides (NIFEREX) 150 MG capsule Take 150 mg by mouth at bedtime.    Yes [provider]  lactulose (CONSTULOSE) 10 GM/15ML solution TAKE 45 MLS EVERY MORNING AND TAKE 45 MLS EVERY EVENING Patient taking differently: TAKE 45 MLS EVERY MORNING AND TAKE 30-45 MLS EVERY EVENING 07/16/20  Yes Mahala Menghini, PA-C  Lancet Devices (ADJUSTABLE LANCING DEVICE) MISC Apply 1 each topically daily. 05/23/20  Yes [provider]  levofloxacin (LEVAQUIN) 500 MG tablet Take 500 mg 3 times a week after dialysis. 04/09/20  Yes Rourk, [name], MD  metoprolol succinate (TOPROL-XL) 25 MG 24 hr tablet Take 1 tablet (25 mg total) by mouth daily. Take at bedtime  on Monday ,Wednesday and Friday due to dialaylisis Patient taking differently: Take 25 mg by mouth See admin instructions. Take 25 mg by mouth at bedtime every night and 25 mg in morning on  Sun, Tues, Thurs and Sat 01/06/20  Yes Barton Dubois, MD  metoprolol succinate (TOPROL-XL) 50 MG 24 hr tablet Take 50 mg by mouth 2 (two) times daily. 02/29/20  Yes [provider]  midodrine (PROAMATINE) 10 MG tablet Take 1 tablet (10 mg total) by mouth 2 (two) times daily with a meal. 04/06/20  Yes Tat, David, MD  milk thistle 175 MG tablet Take 175 mg by mouth 3 (three) times daily.    Yes [provider]  mirtazapine (REMERON) 30 MG tablet Take 1 tablet (30 mg total) by mouth at bedtime. 02/10/20  Yes Roxan Hockey, MD  Multiple Vitamin (MULTIVITAMIN WITH MINERALS) TABS tablet Take 1 tablet by mouth in the morning.    Yes [provider]  NOVOFINE 32G X 6 MM MISC Inject into the skin. 01/31/20  Yes [provider]  nystatin (MYCOSTATIN/NYSTOP) powder Apply topically 3 (three) times daily. Into groin area and affected area Patient taking differently: Apply topically as needed. Into groin area and affected area 01/06/20  Yes Barton Dubois, MD  omega-3 acid ethyl esters (LOVAZA) 1 g capsule Take 2 g by mouth 2 (two) times daily.  04/07/19  Yes [provider]  omeprazole (PRILOSEC) 40 MG capsule Take 40 mg by mouth. 4 times a week   Yes [provider]  ondansetron (ZOFRAN) 4 MG tablet Take 4 mg  by mouth as needed for nausea or vomiting.   Yes [provider]  oxyCODONE-acetaminophen (PERCOCET/ROXICET) 5-325 MG tablet Take 1 tablet by mouth as needed for moderate pain. 02/27/20  Yes [provider]  Ped Vitamins ACD Fl-Iron (TRI-VIT/FLUORIDE/IRON PO)  daily 08/01/19  Yes [provider]  pravastatin (PRAVACHOL) 20 MG tablet Take 20 mg by mouth in the morning.    Yes [provider]  Probiotic Product (PROBIOTIC DAILY PO) Take 1 tablet by mouth in the morning.    Yes [provider]  spironolactone (ALDACTONE) 50 MG tablet Take 50 mg by mouth in the morning.    Yes [provider]  tamsulosin (FLOMAX) 0.4 MG CAPS capsule Take 0.4 mg by mouth every evening.   Yes [provider]  thiamine 100 MG tablet Take 100 mg by mouth daily. 04/29/20  Yes [provider]  tiZANidine (ZANAFLEX) 2 MG tablet Take 2 mg by mouth 2 (two) times daily as needed for muscle spasms.  02/29/20  Yes [provider]  triamcinolone cream (KENALOG) 0.1 % Apply 1 application topically 2 (two) times daily as needed (dry skin Itching).  12/30/17  Yes [provider]  venlafaxine (EFFEXOR) 75 MG tablet Take 75 mg by mouth 3 (three) times daily with meals.    Yes [provider]  XIFAXAN 550 MG TABS tablet TAKE 1 TABLET BY MOUTH TWICE DAILY Patient taking differently: Take 550 mg by mouth 2 (two) times daily. 02/29/20  Yes  Mahala Menghini, PA-C    Allergies as of 07/30/2020 - Review Complete 07/30/2020  Allergen Reaction Noted  . Tape Itching and Rash 09/18/2019    Family History  Problem Relation Age of Onset  . Colon cancer Father 69       deceased age 22  . Breast cancer Sister        deceased  . Diabetes Sister   . Stroke Mother   . Healthy Son   . Healthy Daughter   . Lung cancer Neg Hx   . Ovarian cancer Neg Hx     Social History   Socioeconomic History  . Marital status: Married    Spouse name: Not on file  . Number of children: 2  . Years of education: Not on file  . Highest education level: Not on file  Occupational History  . Occupation: RETIRED    Employer: SELF EMPLOYED  Tobacco Use  . Smoking status: Former Smoker    Packs/day: 2.00    Years: 25.00    Pack years: 50.00    Types: Cigarettes    Quit date: 01/28/1986    Years since quitting: 34.5  . Smokeless tobacco: Never Used  Vaping Use  . Vaping Use: Never used  Substance and Sexual Activity  . Alcohol use: No    Comment: quit in 05/2013  . Drug use: No  . Sexual activity: Not Currently  Other Topics Concern  . Not on file  Social History Narrative   Two step children from second marriage (divorced) who live with him along with some grandchildren.    Social Determinants of Health   Financial Resource Strain: Not on file  Food Insecurity: No Food Insecurity  . Worried About Charity fundraiser in the Last Year: Never true  . Ran Out of Food in the Last Year: Never true  Transportation Needs: No Transportation Needs  . Lack of Transportation (Medical): No  . Lack of Transportation (Non-Medical): No  Physical Activity: Inactive  .  Days of Exercise per Week: 0 days  . Minutes of Exercise per Session: 0 min  Stress: Not on file  Social Connections: Not on file  Intimate Partner Violence: Not At Risk  . Fear of Current or Ex-Partner: No  . Emotionally Abused: No  . Physically Abused: No  . Sexually Abused:  No    Review of Systems: See HPI, otherwise negative ROS  Physical Exam: BP 132/77   Pulse 76   Temp (!) 96.9 F (36.1 C)   Ht 5\' 7"  (1.702 m)   Wt 216 lb (98 kg)   BMI 33.83 kg/m  General:   Examined in the wheelchair.  Alert,   pleasant and cooperative in NAD.  No asterixis. Skin: Sallow Nose:  No deformity, discharge,  or lesions. Mouth:  No deformity or lesions. Neck:  Supple; no masses or thyromegaly. No significant cervical adenopathy. Lungs:  Clear throughout to auscultation.   No wheezes, crackles, or rhonchi. No acute distress. Heart:  Regular rate and rhythm; no murmurs, clicks, rubs,  or gallops. Abdomen: Abdomen markedly distended with positive fluid wave and shifting dullness.  Nontender.  Pulses:  Normal pulses noted. Extremities: 1+ LE edema   Impression/Plan: Somewhat chronically decompensated end-stage EtOH related cirrhosis. Clinically, no overt GI bleeding.  Follow-up labs are pending  Recurrent anasarca/ascites in the setting of chronic renal failure on hemodialysis and diuretic therapy-status post frequent paracentesis; needs another tap this week; history of SBP on prophylactic Levaquin.  History of hepatic encephalopathy-currently remains fairly cognitively intact on lactulose and Xifaxan  Poorly controlled GERD.  Symptoms on a daily basis.  On low-dose PPI therapy due to concerns of recurrent SBP.  Quality of life somewhat marginal given severity of GERD these days.  Denies dysphagia.    Recommendations:  Continue Aldactone and Lasix without change in dosage  Continue lactulose and Xifaxan without change in dosage -goal with lactulose is 3 semiformed stools daily  Continue with a 2 g sodium restriction  I recommend  omeprazole 40 mg once daily as the benefits for quality of life outweigh the risks as discussed  Schedule a  large-volume paracentesis on March 3.  Standard fluid analysis/albumin per standing orders.  Lab work same day to include  CBC, INR and Chem-12  Office visit with [name] in 4 weeks.  Overall prognosis remains relatively poor.        Notice: This dictation was prepared with Dragon dictation along with smaller phrase technology. Any transcriptional errors that result from this process are unintentional and may not be corrected upon review.

## 2020-07-30 NOTE — Telephone Encounter (Signed)
Para scheduled for 08/01/20 at 1:15pm, arrive at 1:00pm.   Called wife and informed her of para appt.

## 2020-07-31 ENCOUNTER — Other Ambulatory Visit (HOSPITAL_COMMUNITY): Payer: Self-pay | Admitting: *Deleted

## 2020-07-31 ENCOUNTER — Other Ambulatory Visit (HOSPITAL_COMMUNITY): Payer: Medicare Other

## 2020-07-31 DIAGNOSIS — D500 Iron deficiency anemia secondary to blood loss (chronic): Secondary | ICD-10-CM

## 2020-07-31 DIAGNOSIS — D62 Acute posthemorrhagic anemia: Secondary | ICD-10-CM

## 2020-08-01 ENCOUNTER — Ambulatory Visit (HOSPITAL_COMMUNITY)
Admission: RE | Admit: 2020-08-01 | Discharge: 2020-08-01 | Disposition: A | Discharge status: Home / Self Care | Payer: Medicare Other | Source: Ambulatory Visit | Attending: Internal Medicine | Admitting: Internal Medicine

## 2020-08-01 ENCOUNTER — Other Ambulatory Visit (HOSPITAL_COMMUNITY)
Admission: RE | Admit: 2020-08-01 | Discharge: 2020-08-01 | Disposition: A | Discharge status: Home / Self Care | Payer: Medicare Other | Source: Ambulatory Visit | Attending: Internal Medicine | Admitting: Internal Medicine

## 2020-08-01 ENCOUNTER — Inpatient Hospital Stay (HOSPITAL_COMMUNITY): Payer: Medicare Other

## 2020-08-01 ENCOUNTER — Encounter (HOSPITAL_COMMUNITY): Payer: Self-pay

## 2020-08-01 ENCOUNTER — Inpatient Hospital Stay (HOSPITAL_COMMUNITY): Payer: Medicare Other | Attending: Hematology

## 2020-08-01 ENCOUNTER — Other Ambulatory Visit: Payer: Self-pay

## 2020-08-01 DIAGNOSIS — Z992 Dependence on renal dialysis: Secondary | ICD-10-CM | POA: Diagnosis not present

## 2020-08-01 DIAGNOSIS — Z79899 Other long term (current) drug therapy: Secondary | ICD-10-CM | POA: Insufficient documentation

## 2020-08-01 DIAGNOSIS — Z803 Family history of malignant neoplasm of breast: Secondary | ICD-10-CM | POA: Insufficient documentation

## 2020-08-01 DIAGNOSIS — E538 Deficiency of other specified B group vitamins: Secondary | ICD-10-CM | POA: Diagnosis not present

## 2020-08-01 DIAGNOSIS — Z800 Family history of malignant neoplasm of digestive organs: Secondary | ICD-10-CM | POA: Diagnosis not present

## 2020-08-01 DIAGNOSIS — F1021 Alcohol dependence, in remission: Secondary | ICD-10-CM | POA: Diagnosis not present

## 2020-08-01 DIAGNOSIS — K922 Gastrointestinal hemorrhage, unspecified: Secondary | ICD-10-CM | POA: Insufficient documentation

## 2020-08-01 DIAGNOSIS — D72819 Decreased white blood cell count, unspecified: Secondary | ICD-10-CM | POA: Diagnosis not present

## 2020-08-01 DIAGNOSIS — D500 Iron deficiency anemia secondary to blood loss (chronic): Secondary | ICD-10-CM | POA: Insufficient documentation

## 2020-08-01 DIAGNOSIS — K7210 Chronic hepatic failure without coma: Secondary | ICD-10-CM | POA: Insufficient documentation

## 2020-08-01 DIAGNOSIS — Z87891 Personal history of nicotine dependence: Secondary | ICD-10-CM | POA: Insufficient documentation

## 2020-08-01 DIAGNOSIS — K7031 Alcoholic cirrhosis of liver with ascites: Secondary | ICD-10-CM | POA: Insufficient documentation

## 2020-08-01 DIAGNOSIS — D696 Thrombocytopenia, unspecified: Secondary | ICD-10-CM | POA: Diagnosis not present

## 2020-08-01 DIAGNOSIS — R161 Splenomegaly, not elsewhere classified: Secondary | ICD-10-CM | POA: Insufficient documentation

## 2020-08-01 DIAGNOSIS — N186 End stage renal disease: Secondary | ICD-10-CM | POA: Diagnosis not present

## 2020-08-01 DIAGNOSIS — D631 Anemia in chronic kidney disease: Secondary | ICD-10-CM | POA: Diagnosis not present

## 2020-08-01 DIAGNOSIS — D509 Iron deficiency anemia, unspecified: Secondary | ICD-10-CM | POA: Insufficient documentation

## 2020-08-01 DIAGNOSIS — D62 Acute posthemorrhagic anemia: Secondary | ICD-10-CM

## 2020-08-01 LAB — COMPREHENSIVE METABOLIC PANEL
ALT: 15 U/L (ref 0–44)
AST: 20 U/L (ref 15–41)
Albumin: 2.2 g/dL — ABNORMAL LOW (ref 3.5–5.0)
Alkaline Phosphatase: 191 U/L — ABNORMAL HIGH (ref 38–126)
Anion gap: 10 (ref 5–15)
BUN: 19 mg/dL (ref 8–23)
CO2: 26 mmol/L (ref 22–32)
Calcium: 8.3 mg/dL — ABNORMAL LOW (ref 8.9–10.3)
Chloride: 93 mmol/L — ABNORMAL LOW (ref 98–111)
Creatinine, Ser: 2.73 mg/dL — ABNORMAL HIGH (ref 0.61–1.24)
GFR, Estimated: 24 mL/min — ABNORMAL LOW (ref >60)
Glucose, Bld: 214 mg/dL — ABNORMAL HIGH (ref 70–99)
Potassium: 3.8 mmol/L (ref 3.5–5.1)
Sodium: 129 mmol/L — ABNORMAL LOW (ref 135–145)
Total Bilirubin: 0.8 mg/dL (ref 0.3–1.2)
Total Protein: 6.2 g/dL — ABNORMAL LOW (ref 6.5–8.1)

## 2020-08-01 LAB — DIFFERENTIAL
Abs Immature Granulocytes: 0 10*3/uL (ref 0.00–0.07)
Band Neutrophils: 0 %
Basophils Absolute: 0 10*3/uL (ref 0.0–0.1)
Basophils Relative: 1 %
Blasts: 0 %
Eosinophils Absolute: 0.1 10*3/uL (ref 0.0–0.5)
Eosinophils Relative: 5 %
Lymphocytes Relative: 14 %
Lymphs Abs: 0.4 10*3/uL — ABNORMAL LOW (ref 0.7–4.0)
Metamyelocytes Relative: 0 %
Monocytes Absolute: 0.2 10*3/uL (ref 0.1–1.0)
Monocytes Relative: 8 %
Myelocytes: 0 %
Neutro Abs: 2.1 10*3/uL (ref 1.7–7.7)
Neutrophils Relative %: 72 %
Other: 0 %
Promyelocytes Relative: 0 %
nRBC: 0 /100 WBC

## 2020-08-01 LAB — CBC WITH DIFFERENTIAL/PLATELET

## 2020-08-01 LAB — BODY FLUID CELL COUNT WITH DIFFERENTIAL
Lymphs, Fluid: 54 %
Monocyte-Macrophage-Serous Fluid: 37 % — ABNORMAL LOW (ref 50–90)
Neutrophil Count, Fluid: 9 % (ref 0–25)
Total Nucleated Cell Count, Fluid: 125 cu mm (ref 0–1000)

## 2020-08-01 LAB — GRAM STAIN: Gram Stain: NONE SEEN

## 2020-08-01 LAB — CBC
HCT: 24.9 % — ABNORMAL LOW (ref 39.0–52.0)
Hemoglobin: 7.6 g/dL — ABNORMAL LOW (ref 13.0–17.0)
MCH: 31.9 pg (ref 26.0–34.0)
MCHC: 30.5 g/dL (ref 30.0–36.0)
MCV: 104.6 fL — ABNORMAL HIGH (ref 80.0–100.0)
Platelets: 82 10*3/uL — ABNORMAL LOW (ref 150–400)
RBC: 2.38 MIL/uL — ABNORMAL LOW (ref 4.22–5.81)
RDW: 16.6 % — ABNORMAL HIGH (ref 11.5–15.5)
WBC: 2.8 10*3/uL — ABNORMAL LOW (ref 4.0–10.5)
nRBC: 0 % (ref 0.0–0.2)

## 2020-08-01 LAB — LACTATE DEHYDROGENASE: LDH: 103 U/L (ref 98–192)

## 2020-08-01 LAB — VITAMIN D 25 HYDROXY (VIT D DEFICIENCY, FRACTURES): Vit D, 25-Hydroxy: 71.52 ng/mL (ref 30–100)

## 2020-08-01 LAB — FERRITIN: Ferritin: 333 ng/mL (ref 24–336)

## 2020-08-01 LAB — PROTIME-INR
INR: 1.2 (ref 0.8–1.2)
Prothrombin Time: 14.9 seconds (ref 11.4–15.2)

## 2020-08-01 LAB — VITAMIN B12: Vitamin B-12: 668 pg/mL (ref 180–914)

## 2020-08-01 MED ORDER — ALBUMIN HUMAN 25 % IV SOLN: 0.0000 g | Freq: Once | INTRAVENOUS | Status: DC

## 2020-08-01 NOTE — Discharge Instructions (Signed)
Paracentesis, Care After This sheet gives you information about how to care for yourself after your procedure. Your health care provider may also give you more specific instructions. If you have problems or questions, contact your health care provider. What can I expect after the procedure? After the procedure, it is common to have a small amount of clear fluid coming from the puncture site. Follow these instructions at home: Puncture site care  Follow instructions from your health care provider about how to take care of your puncture site. Make sure you: ? Wash your hands with soap and water before and after you change your bandage (dressing). If soap and water are not available, use hand sanitizer. ? Change your dressing as told by your health care provider.  Check your puncture area every day for signs of infection. Check for: ? Redness, swelling, or pain. ? More fluid or blood. ? Warmth. ? Pus or a bad smell.   General instructions  Return to your normal activities as told by your health care provider. Ask your health care provider what activities are safe for you.  Take over-the-counter and prescription medicines only as told by your health care provider.  Do not take baths, swim, or use a hot tub until your health care provider approves. Ask your health care provider if you may take showers. You may only be allowed to take sponge baths.  Keep all follow-up visits as told by your health care provider. This is important. Contact a health care provider if:  You have redness, swelling, or pain at your puncture site.  You have more fluid or blood coming from your puncture site.  Your puncture site feels warm to the touch.  You have pus or a bad smell coming from your puncture site.  You have a fever. Get help right away if:  You have chest pain or shortness of breath.  You develop increasing pain, discomfort, or swelling in your abdomen.  You feel dizzy or light-headed or  you faint. Summary  After the procedure, it is common to have a small amount of clear fluid coming from the puncture site.  Follow instructions from your health care provider about how to take care of your puncture site.  Check your puncture area every day signs of infection.  Keep all follow-up visits as told by your health care provider. This information is not intended to replace advice given to you by your health care provider. Make sure you discuss any questions you have with your health care provider. Document Revised: 11/29/2018 Document Reviewed: 03/08/2018 Elsevier Patient Education  2021 Elsevier Inc.  

## 2020-08-01 NOTE — Procedures (Signed)
   US guided LLQ paracentesis  3.4 L yellow fluid Tolerated well Sent for labs per MD  EBL: none

## 2020-08-01 NOTE — Sedation Documentation (Signed)
PT tolerated left sided paracentesis procedure well today and 3.4 liters of cloudy yellow fluid drained with labs collected and sent for processing. PT denied any complaints at completion of procedure and verbalized understanding of discharge instructions. PT left via wheelchair with his wife at this time.

## 2020-08-02 LAB — PATHOLOGIST SMEAR REVIEW

## 2020-08-05 NOTE — Progress Notes (Incomplete)
H&P  Chief Complaint: ***  History of Present Illness: ***  Past Medical History:  Diagnosis Date  . A-fib [facility])    when initially stating dialysis january 2021 at [facility] per spouse; never heard anything else about it  . Alcoholic cirrhosis ([place])    patient reports completing hep a and b vaccines in 2006  . Anemia    has had 3 units of prbcs 2013  . Anxiety   . Arthritis   . Asymptomatic gallstones    Ultrasound in 2006  . C. difficile colitis   . Cholelithiasis   . Chronic kidney disease    Dialysis M/W/F/Sa  . Depression   . Diabetes ([place])   . Duodenal ulcer with hemorrhage    per patient in 2006 or 2007, records have been requested  . Dyspnea    low oxygen level -  has O2 2 L all day  . 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 GERD (gastroesophageal reflux disease)   . Heart burn   . 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 HOH (hard of hearing)   . Hypertension   . 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 SBP (spontaneous bacterial peritonitis) ([place])    2020, November 2021.   [name] Splenomegaly    Ultrasound in 2006    Past Surgical History:  Procedure Laterality Date  . AGILE CAPSULE N/A 09/20/2019   Procedure: AGILE CAPSULE;  Surgeon: Daneil Dolin, MD;  [facility];  Service: Endoscopy;  Laterality: N/A;  . APPENDECTOMY    . AV FISTULA PLACEMENT Left 08/17/2019   Procedure: ARTERIOVENOUS (AV) FISTULA CREATION VERSES ARTERIOVENOUS GRAFT;  Surgeon: [name], MD;  Location: [place];  Service: Vascular;  Laterality: Left;  . BASCILIC VEIN TRANSPOSITION Left 09/28/2019   Procedure: LEFT SECOND STAGE BASCILIC VEIN TRANSPOSITION;  Surgeon: [name], MD;  Location: [place];  Service: Vascular;  Laterality: Left;  . BIOPSY  02/03/2018   Procedure: BIOPSY;  Surgeon: Daneil Dolin, MD;  [facility];  Service: Endoscopy;;  bx of gastric polyps  . BIOPSY  09/19/2019   Procedure: BIOPSY;  Surgeon: Danie Binder, MD;  [facility];   Service: Endoscopy;;  . CATARACT EXTRACTION Right   . COLONOSCOPY  03/10/2005   Rectal polyp as described above, removed with snare. Left sided  diverticula. The remainder of the colonic mucosa appeared normal. Inflammed polyp on path.  . COLONOSCOPY  04/1999   Dr. Thea Silversmith polyps removed,  Path showed hyperplastic  . COLONOSCOPY  10/2015   Dr. Britta Mccreedy: diverticulosis, single sessile tubular adenoma 3-81mm in size removed from descending colon.   . COLONOSCOPY WITH ESOPHAGOGASTRODUODENOSCOPY (EGD)  03/31/2012   ERX:VQMGQQP AVMs. Colonic diverticulosis. tubular adenoma colon  . COLONOSCOPY WITH ESOPHAGOGASTRODUODENOSCOPY (EGD)  06/2019   FORSYTH: small esophageal varices without high risk stigmata, single large AVM on lesser curvature in gastric body s/p APC ablation, gastric antral and duodenal bulb polyposis. No significant source to explain transfusion dependent anemia. Colonoscopy with portal colopathy and diffuse edema, changes of Cdiff colitis on colonoscopy  . ESOPHAGEAL BANDING N/A 02/03/2018   Procedure: ESOPHAGEAL BANDING;  Surgeon: Daneil Dolin, MD;  [facility];  Service: Endoscopy;  Laterality: N/A;  . ESOPHAGOGASTRODUODENOSCOPY  01/15/2005   Three columns grade 1 to 2 esophageal varices, otherwise normal esophageal mucosa.  Esophagus was not manipulated otherwise./Nodularity of the antrum with overlying erosions, nonspecific finding. Path showed  rare H.pylori  . ESOPHAGOGASTRODUODENOSCOPY  09/2008   Dr. Gaylene Brooks columns of grade 2-3 esoph varices, only one column was prominent. Portal gastropathy, multiple gastrc polyps at antrum, two were 2cm with black eschar, bulbar polyps, bulbar erosions  . ESOPHAGOGASTRODUODENOSCOPY  11/2004   Dr. Brantley Stage 3 esoph varices  . ESOPHAGOGASTRODUODENOSCOPY  03/31/2012   RMR: 4 columns(3-GR 2, 1-GR1) non-bleeding esophageal varices, portal gastropathy, small HH, early GAVE, multiple gastric polyps   .  ESOPHAGOGASTRODUODENOSCOPY (EGD) WITH PROPOFOL N/A 02/03/2018   Dr. Gala Romney: Esophageal varices, 3 columns grade 1-2.  Portal hypertensive gastropathy.  Multiple gastric polyps, biopsy consistent with hyperplastic.  [name] ESOPHAGOGASTRODUODENOSCOPY (EGD) WITH PROPOFOL N/A 09/19/2019   Fields: grade I and II esophageal varcies, mild portal hypertensive gastropathy, moderate gastritis but no H. pylori, single hyperplastic gastric polyp removed, obvious source for melena/transfusion dependent anemia not identified, may be due to friable gastric and duodenal mucosa in the setting of portal hypertension  . GASTRIC VARICES BANDING  03/31/2012   Procedure: GASTRIC VARICES BANDING;  Surgeon: Daneil Dolin, MD;  [facility];  Service: Endoscopy;  Laterality: N/A;  . GIVENS CAPSULE STUDY N/A 09/21/2019   Poor study with a lot of debris obstructing much of the view of the first approximate 3 hours out of 6 hours.  No obvious source of bleeding identified.  Sequela of bleeding and old blood seen in the form of"whisps" of blood, blood flecks mostly toward the end of the study.  . IR REMOVAL TUN CV CATH W/O FL  02/15/2020  . POLYPECTOMY  09/19/2019   Procedure: POLYPECTOMY;  Surgeon: Danie Binder, MD;  [facility];  Service: Endoscopy;;  . UMBILICAL HERNIA REPAIR  2017   exploratory laparotomy, abdominal washout    Home Medications:  Allergies as of 08/06/2020      Reactions   Tape Itching, Rash      Medication List       Accurate as of August 05, 2020  8:23 PM. If you have any questions, ask your nurse or doctor.        acetaminophen 500 MG tablet Commonly known as: TYLENOL Take 1,000 mg by mouth as needed for mild pain.   Adjustable Lancing Device Misc Apply 1 each topically daily.   Alcohol Prep 70 % Pads Apply 1 each topically 3 (three) times daily.   Alka-Seltzer Heartburn + Gas 750-80 MG Chew Generic drug: Calcium Carbonate-Simethicone Chew by mouth as needed.   calcium  carbonate 500 MG chewable tablet Commonly known as: TUMS - dosed in mg elemental calcium Chew 1 tablet by mouth 3 (three) times daily with meals.   diltiazem 120 MG 24 hr capsule Commonly known as: TIAZAC Take 120 mg by mouth daily.   diltiazem 120 MG tablet Commonly known as: CARDIZEM Take by mouth.   furosemide 40 MG tablet Commonly known as: LASIX Take 40 mg by mouth 2 (two) times daily.   gabapentin 100 MG capsule Commonly known as: NEURONTIN TAKE 1 CAPSULE BY MOUTH AT BEDTIME   hydrocortisone 2.5 % rectal cream Commonly known as: Anusol-HC Place 1 application rectally 2 (two) times daily. What changed:   when to take this  reasons to take this   hydrOXYzine 25 MG capsule Commonly known as: VISTARIL Take 25 mg by mouth 3 (three) times daily as needed for itching.   iron polysaccharides 150 MG capsule Commonly known as: NIFEREX Take 150 mg by mouth at bedtime.   lactulose 10 GM/15ML solution Commonly known  as: Constulose TAKE 45 MLS EVERY MORNING AND TAKE 45 MLS EVERY EVENING What changed: additional instructions   levofloxacin 500 MG tablet Commonly known as: Levaquin Take 500 mg 3 times a week after dialysis.   metoprolol succinate 25 MG 24 hr tablet Commonly known as: TOPROL-XL Take 1 tablet (25 mg total) by mouth daily. Take at bedtime  on Monday ,Wednesday and Friday due to dialaylisis What changed:   when to take this  additional instructions   metoprolol succinate 50 MG 24 hr tablet Commonly known as: TOPROL-XL Take 50 mg by mouth 2 (two) times daily. What changed: Another medication with the same name was changed. Make sure you understand how and when to take each.   midodrine 10 MG tablet Commonly known as: PROAMATINE Take 1 tablet (10 mg total) by mouth 2 (two) times daily with a meal.   milk thistle 175 MG tablet Take 175 mg by mouth 3 (three) times daily.   mirtazapine 30 MG tablet Commonly known as: REMERON Take 1 tablet (30 mg  total) by mouth at bedtime.   multivitamin with minerals Tabs tablet Take 1 tablet by mouth in the morning.   NovoFine 32G X 6 MM Misc Generic drug: Insulin Pen Needle Inject into the skin.   nystatin powder Commonly known as: MYCOSTATIN/NYSTOP Apply topically 3 (three) times daily. Into groin area and affected area What changed:   when to take this  reasons to take this   omega-3 acid ethyl esters 1 g capsule Commonly known as: LOVAZA Take 2 g by mouth 2 (two) times daily.   omeprazole 40 MG capsule Commonly known as: PRILOSEC Take 40 mg by mouth. 4 times a week   ondansetron 4 MG tablet Commonly known as: ZOFRAN Take 4 mg by mouth as needed for nausea or vomiting.   oxyCODONE-acetaminophen 5-325 MG tablet Commonly known as: PERCOCET/ROXICET Take 1 tablet by mouth as needed for moderate pain.   pravastatin 20 MG tablet Commonly known as: PRAVACHOL Take 20 mg by mouth in the morning.   PROBIOTIC DAILY PO Take 1 tablet by mouth in the morning.   spironolactone 50 MG tablet Commonly known as: ALDACTONE Take 50 mg by mouth in the morning.   tamsulosin 0.4 MG Caps capsule Commonly known as: FLOMAX Take 0.4 mg by mouth every evening.   thiamine 100 MG tablet Take 100 mg by mouth daily.   tiZANidine 2 MG tablet Commonly known as: ZANAFLEX Take 2 mg by mouth 2 (two) times daily as needed for muscle spasms.   [name] Generic drug: insulin degludec Inject 120 Units into the skin at bedtime. When blood sugars are up What changed: additional instructions   TRI-VIT/FLUORIDE/IRON PO daily   triamcinolone 0.1 % Commonly known as: KENALOG Apply 1 application topically 2 (two) times daily as needed (dry skin Itching).   venlafaxine 75 MG tablet Commonly known as: EFFEXOR Take 75 mg by mouth 3 (three) times daily with meals.   vitamin C 1000 MG tablet Take 1,000 mg by mouth 2 (two) times daily.   Xifaxan 550 MG Tabs  tablet Generic drug: rifaximin TAKE 1 TABLET BY MOUTH TWICE DAILY What changed: how much to take       Allergies:  Allergies  Allergen Reactions  . Tape Itching and Rash    Family History  Problem Relation Age of Onset  . Colon cancer Father 81       deceased age 43  . Breast cancer  Sister        deceased  . Diabetes Sister   . Stroke Mother   . Healthy Son   . Healthy Daughter   . Lung cancer Neg Hx   . Ovarian cancer Neg Hx     Social History:  reports that he quit smoking about 34 years ago. His smoking use included cigarettes. He has a 50.00 pack-year smoking history. He has never used smokeless tobacco. He reports that he does not drink alcohol and does not use drugs.  ROS: A complete review of systems was performed.  All systems are negative except for pertinent findings as noted.  Physical Exam:  Vital signs in last 24 hours: There were no vitals taken for this visit. Constitutional:  Alert and oriented, No acute distress Cardiovascular: Regular rate  Respiratory: Normal respiratory effort GI: Abdomen is soft, nontender, nondistended, no abdominal masses. No CVAT.  Genitourinary: Normal male phallus, testes are descended bilaterally and non-tender and without masses, scrotum is normal in appearance without lesions or masses, perineum is normal on inspection. Lymphatic: No lymphadenopathy Neurologic: Grossly intact, no focal deficits Psychiatric: Normal mood and affect  Impression/Assessment:  ***  Plan:  ***

## 2020-08-06 ENCOUNTER — Ambulatory Visit (INDEPENDENT_AMBULATORY_CARE_PROVIDER_SITE_OTHER): Payer: Medicare Other

## 2020-08-06 ENCOUNTER — Other Ambulatory Visit: Payer: Self-pay

## 2020-08-06 DIAGNOSIS — R339 Retention of urine, unspecified: Secondary | ICD-10-CM | POA: Diagnosis not present

## 2020-08-06 LAB — CULTURE, BODY FLUID W GRAM STAIN -BOTTLE: Culture: NO GROWTH

## 2020-08-06 NOTE — Patient Instructions (Signed)

## 2020-08-06 NOTE — Progress Notes (Signed)
Cath Change/ Replacement  Patient is present today for a catheter change due to urinary retention.  63ml of water was removed from the balloon, a 16FR foley cath was removed without difficulty.  Patient was cleaned and prepped in a sterile fashion with betadine. A 16 FR foley cath was replaced into the bladder no complications were noted Urine return was noted 29ml and urine was light yellow in color. The balloon was filled with 69ml of sterile water. A leg bag was attached for drainage.  Patient was given proper instruction on catheter care.    Performed by: Shanetra Blumenstock, lpn  Follow up: Keep net scheduled [place]

## 2020-08-07 ENCOUNTER — Ambulatory Visit (HOSPITAL_COMMUNITY): Payer: Medicare Other | Admitting: Hematology

## 2020-08-08 ENCOUNTER — Inpatient Hospital Stay (HOSPITAL_COMMUNITY): Payer: Medicare Other | Admitting: Hematology

## 2020-08-08 ENCOUNTER — Other Ambulatory Visit: Payer: Self-pay

## 2020-08-08 VITALS — BP 120/67 | HR 72 | Temp 98.2°F | Resp 17 | Wt 221.6 lb

## 2020-08-08 DIAGNOSIS — N186 End stage renal disease: Secondary | ICD-10-CM | POA: Diagnosis not present

## 2020-08-08 DIAGNOSIS — D500 Iron deficiency anemia secondary to blood loss (chronic): Secondary | ICD-10-CM | POA: Diagnosis not present

## 2020-08-08 NOTE — Patient Instructions (Signed)
[facility] Discharge Instructions  You were seen today by Dr. Katragadda. He went over your recent results. Dr. Katragadda will see you back in 6 weeks for labs and follow up.   Thank you for choosing [facility] at [facility] to provide your oncology and hematology care.  To afford each patient quality time with our provider, please arrive at least 15 minutes before your scheduled appointment time.   If you have a lab appointment with the Cancer Center please come in thru the Main Entrance and check in at the main information desk  You need to re-schedule your appointment should you arrive 10 or more minutes late.  We strive to give you quality time with our providers, and arriving late affects you and other patients whose appointments are after yours.  Also, if you no show three or more times for appointments you may be dismissed from the clinic at the providers discretion.     Again, thank you for choosing [facility].  Our hope is that these requests will decrease the amount of time that you wait before being seen by our physicians.       _____________________________________________________________  Should you have questions after your visit to  Cancer Center, please contact our office at [phone] between the hours of 8:00 a.m. and 4:30 p.m.  Voicemails left after 4:00 p.m. will not be returned until the following business day.  For prescription refill requests, have your pharmacy contact our office and allow 72 hours.    Cancer Center Support Programs:   > Cancer Support Group  2nd Tuesday of the month 1pm-2pm, Journey Room    

## 2020-08-08 NOTE — Progress Notes (Signed)
[place], [place] [address]   CLINIC:  Medical Oncology/Hematology  PCP:  Monico Blitz, MD [address] [place] [address]  [phone]  REASON FOR VISIT:  Follow-up for IDA  PRIOR THERAPY: Intermittent Feraheme  CURRENT THERAPY: Intermittent Venofer last on 07/09/2020  INTERVAL HISTORY:  Jeffrey [name], a 75 y.o. male, returns for routine follow-up for his IDA. [name] was last seen by Faythe Casa, NP, on 05/30/2020.  Today he is accompanied by his wife and he reports feeling okay. He has occasional bright red blood in his stools, last time about 1 month ago, but denies hemoptysis. He gets Retacrit while he goes to HD at [facility] on M/W/F, but does not get iron infusions at dialysis; he started HD in January 2021. He had paracentesis on 03/03 with 3.4 liters of yellow fluid pulled off. His last unit of PRBC was on 01/22/2020. He has an indwelling urinary catheter and fills up the bag multiple times per day. He takes lactulose as needed for constipation. His only pain right now is in his right armpit which might be due to pulling himself up to get to his walker.    REVIEW OF SYSTEMS:  Review of Systems  Constitutional: Positive for appetite change (75%) and fatigue (50%).  Respiratory: Negative for hemoptysis.   Cardiovascular: Positive for leg swelling.  Gastrointestinal: Positive for blood in stool (occasional BRBPR).  Musculoskeletal: Positive for myalgias (6/10 R armpit pain).  All other systems reviewed and are negative.   PAST MEDICAL/SURGICAL HISTORY:  Past Medical History:  Diagnosis Date   A-fib [facility])    when initially stating dialysis january 2021 at [facility] per spouse; never heard anything else about it   Alcoholic cirrhosis [facility])    patient reports completing hep a and b vaccines in 2006   Anemia    has had 3 units of prbcs 2013   Anxiety    Arthritis    Asymptomatic gallstones    Ultrasound in 2006    C. difficile colitis    Cholelithiasis    Chronic kidney disease    Dialysis M/W/F/Sa   Depression    Diabetes ([place])    Duodenal ulcer with hemorrhage    per patient in 2006 or 2007, records have been requested   Dyspnea    low oxygen level -  has O2 2 L all day   Esophageal varices ([place])    see P[facility]   GERD (gastroesophageal reflux disease)    Heart burn    History of alcohol abuse    quit 05/2013   HOH (hard of hearing)    Hypertension    Liver cirrhosis ([place])    SBP (spontaneous bacterial peritonitis) ([place])    2020, November 2021.    Splenomegaly    Ultrasound in 2006   Past Surgical History:  Procedure Laterality Date   AGILE CAPSULE N/A 09/20/2019   Procedure: AGILE CAPSULE;  Surgeon: Daneil Dolin, MD;  [facility];  Service: Endoscopy;  Laterality: N/A;   APPENDECTOMY     AV FISTULA PLACEMENT Left 08/17/2019   Procedure: ARTERIOVENOUS (AV) FISTULA CREATION VERSES ARTERIOVENOUS GRAFT;  Surgeon: [name], MD;  Location: [place];  Service: Vascular;  Laterality: Left;   [place] Left 09/28/2019   Procedure: LEFT SECOND STAGE [place];  Surgeon: [name], MD;  Location: [place];  Service: Vascular;  Laterality: Left;   BIOPSY  02/03/2018  Procedure: BIOPSY;  Surgeon: Daneil Dolin, MD;  [facility];  Service: Endoscopy;;  bx of gastric polyps   BIOPSY  09/19/2019   Procedure: BIOPSY;  Surgeon: Danie Binder, MD;  [facility];  Service: Endoscopy;;   CATARACT EXTRACTION Right    COLONOSCOPY  03/10/2005   Rectal polyp as described above, removed with snare. Left sided  diverticula. The remainder of the colonic mucosa appeared normal. Inflammed polyp on path.   COLONOSCOPY  04/1999   Dr. Thea Silversmith polyps removed,  Path showed hyperplastic   COLONOSCOPY  10/2015   Dr. Britta Mccreedy: diverticulosis, single sessile tubular adenoma 3-35mm in size removed from descending  colon.    COLONOSCOPY WITH ESOPHAGOGASTRODUODENOSCOPY (EGD)  03/31/2012   [id number] AVMs. Colonic diverticulosis. tubular adenoma colon   COLONOSCOPY WITH ESOPHAGOGASTRODUODENOSCOPY (EGD)  06/2019   FORSYTH: small esophageal varices without high risk stigmata, single large AVM on lesser curvature in gastric body s/p APC ablation, gastric antral and duodenal bulb polyposis. No significant source to explain transfusion dependent anemia. Colonoscopy with portal colopathy and diffuse edema, changes of Cdiff colitis on colonoscopy   ESOPHAGEAL BANDING N/A 02/03/2018   Procedure: ESOPHAGEAL BANDING;  Surgeon: Daneil Dolin, MD;  [facility];  Service: Endoscopy;  Laterality: N/A;   ESOPHAGOGASTRODUODENOSCOPY  01/15/2005   Three columns grade 1 to 2 esophageal varices, otherwise normal esophageal mucosa.  Esophagus was not manipulated otherwise./Nodularity of the antrum with overlying erosions, nonspecific finding. Path showed rare H.pylori   ESOPHAGOGASTRODUODENOSCOPY  09/2008   Dr. Gaylene Brooks columns of grade 2-3 esoph varices, only one column was prominent. Portal gastropathy, multiple gastrc polyps at antrum, two were 2cm with black eschar, bulbar polyps, bulbar erosions   ESOPHAGOGASTRODUODENOSCOPY  11/2004   Dr. Brantley Stage 3 esoph varices   ESOPHAGOGASTRODUODENOSCOPY  03/31/2012   RMR: 4 columns(3-GR 2, 1-GR1) non-bleeding esophageal varices, portal gastropathy, small HH, early GAVE, multiple gastric polyps    ESOPHAGOGASTRODUODENOSCOPY (EGD) WITH PROPOFOL N/A 02/03/2018   Dr. Gala Romney: Esophageal varices, 3 columns grade 1-2.  Portal hypertensive gastropathy.  Multiple gastric polyps, biopsy consistent with hyperplastic.   ESOPHAGOGASTRODUODENOSCOPY (EGD) WITH PROPOFOL N/A 09/19/2019   Fields: grade I and II esophageal varcies, mild portal hypertensive gastropathy, moderate gastritis but no H. pylori, single hyperplastic gastric polyp removed, obvious source for  melena/transfusion dependent anemia not identified, may be due to friable gastric and duodenal mucosa in the setting of portal hypertension   GASTRIC VARICES BANDING  03/31/2012   Procedure: GASTRIC VARICES BANDING;  Surgeon: Daneil Dolin, MD;  [facility];  Service: Endoscopy;  Laterality: N/A;   GIVENS CAPSULE STUDY N/A 09/21/2019   Poor study with a lot of debris obstructing much of the view of the first approximate 3 hours out of 6 hours.  No obvious source of bleeding identified.  Sequela of bleeding and old blood seen in the form of"whisps" of blood, blood flecks mostly toward the end of the study.   IR REMOVAL TUN CV CATH W/O FL  02/15/2020   POLYPECTOMY  09/19/2019   Procedure: POLYPECTOMY;  Surgeon: Danie Binder, MD;  [facility];  Service: Endoscopy;;   UMBILICAL HERNIA REPAIR  2017   exploratory laparotomy, abdominal washout    SOCIAL HISTORY:  Social History   Socioeconomic History   Marital status: Married    Spouse name: Not on file   Number of children: 2   Years of education: Not on file   Highest education level:  Not on file  Occupational History   Occupation: RETIRED    Employer: SELF EMPLOYED  Tobacco Use   Smoking status: Former Smoker    Packs/day: 2.00    Years: 25.00    Pack years: 50.00    Types: Cigarettes    Quit date: 01/28/1986    Years since quitting: 34.5   Smokeless tobacco: Never Used  Vaping Use   Vaping Use: Never used  Substance and Sexual Activity   Alcohol use: No    Comment: quit in 05/2013   Drug use: No   Sexual activity: Not Currently  Other Topics Concern   Not on file  Social History Narrative   Two step children from second marriage (divorced) who live with him along with some grandchildren.    Social Determinants of Health   Financial Resource Strain: Not on file  Food Insecurity: No Food Insecurity   Worried About Charity fundraiser in the Last Year: Never true   Ran Out of  Food in the Last Year: Never true  Transportation Needs: No Transportation Needs   Lack of Transportation (Medical): No   Lack of Transportation (Non-Medical): No  Physical Activity: Inactive   Days of Exercise per Week: 0 days   Minutes of Exercise per Session: 0 min  Stress: Not on file  Social Connections: Not on file  Intimate Partner Violence: Not At Risk   Fear of Current or Ex-Partner: No   Emotionally Abused: No   Physically Abused: No   Sexually Abused: No    FAMILY HISTORY:  Family History  Problem Relation Age of Onset   Colon cancer Father 38       deceased age 71   Breast cancer Sister        deceased   Diabetes Sister    Stroke Mother    Healthy Son    Healthy Daughter    Lung cancer Neg Hx    Ovarian cancer Neg Hx     CURRENT MEDICATIONS:  Current Outpatient Medications  Medication Sig Dispense Refill   acetaminophen (TYLENOL) 500 MG tablet Take 1,000 mg by mouth as needed for mild pain.     Alcohol Swabs (ALCOHOL PREP) 70 % PADS Apply 1 each topically 3 (three) times daily.     Ascorbic Acid (VITAMIN C) 1000 MG tablet Take 1,000 mg by mouth 2 (two) times daily.     calcium carbonate (TUMS - DOSED IN MG ELEMENTAL CALCIUM) 500 MG chewable tablet Chew 1 tablet by mouth 3 (three) times daily with meals.     Calcium Carbonate-Simethicone (ALKA-SELTZER HEARTBURN + GAS) 750-80 MG CHEW Chew by mouth as needed.     diltiazem (CARDIZEM) 120 MG tablet Take by mouth.     diltiazem (TIAZAC) 120 MG 24 hr capsule Take 120 mg by mouth daily.     furosemide (LASIX) 40 MG tablet Take 40 mg by mouth 2 (two) times daily.     furosemide (LASIX) 80 MG tablet Take by mouth.     gabapentin (NEURONTIN) 100 MG capsule TAKE 1 CAPSULE BY MOUTH AT BEDTIME 90 capsule 0   gabapentin (NEURONTIN) 100 MG capsule Take by mouth.     hydrocortisone (ANUSOL-HC) 2.5 % rectal cream Place 1 application rectally 2 (two) times daily. (Patient taking differently: Place 1  application rectally 2 (two) times daily as needed for hemorrhoids or anal itching.) 30 g 1   hydrOXYzine (VISTARIL) 25 MG capsule Take 25 mg by mouth 3 (three) times daily as  needed for itching.      Insulin Degludec (TRESIBA FLEXTOUCH [place]) Inject into the skin.     insulin degludec (TRESIBA FLEXTOUCH) 200 UNIT/ML FlexTouch Pen Inject 120 Units into the skin at bedtime. When blood sugars are up (Patient taking differently: Inject 120 Units into the skin at bedtime.)     iron polysaccharides (NIFEREX) 150 MG capsule Take 150 mg by mouth at bedtime.      lactulose (CONSTULOSE) 10 GM/15ML solution TAKE 45 MLS EVERY MORNING AND TAKE 45 MLS EVERY EVENING (Patient taking differently: TAKE 45 MLS EVERY MORNING AND TAKE 30-45 MLS EVERY EVENING) 2700 mL 12   Lancet Devices (ADJUSTABLE LANCING DEVICE) MISC Apply 1 each topically daily.     levofloxacin (LEVAQUIN) 500 MG tablet Take 500 mg 3 times a week after dialysis. 12 tablet 11   metoprolol succinate (TOPROL-XL) 25 MG 24 hr tablet Take 1 tablet (25 mg total) by mouth daily. Take at bedtime  on Monday ,Wednesday and Friday due to dialaylisis (Patient taking differently: Take 25 mg by mouth See admin instructions. Take 25 mg by mouth at bedtime every night and 25 mg in morning on  Sun, Tues, Thurs and Sat)     metoprolol succinate (TOPROL-XL) 50 MG 24 hr tablet Take 50 mg by mouth 2 (two) times daily.     metoprolol tartrate (LOPRESSOR) 25 MG tablet Take by mouth.     midodrine (PROAMATINE) 10 MG tablet Take 1 tablet (10 mg total) by mouth 2 (two) times daily with a meal. 60 tablet 1   milk thistle 175 MG tablet Take 175 mg by mouth 3 (three) times daily.      mirtazapine (REMERON) 30 MG tablet Take 1 tablet (30 mg total) by mouth at bedtime. 30 tablet 3   Multiple Vitamin (MULTIVITAMIN WITH MINERALS) TABS tablet Take 1 tablet by mouth in the morning.      NOVOFINE 32G X 6 MM MISC Inject into the skin.     nystatin (MYCOSTATIN/NYSTOP) powder  Apply topically 3 (three) times daily. Into groin area and affected area (Patient taking differently: Apply topically as needed. Into groin area and affected area) 45 g 0   Omega-3 1000 MG CAPS  ( 200 mg ), bid, # 120     omega-3 acid ethyl esters (LOVAZA) 1 g capsule Take 2 g by mouth 2 (two) times daily.      omeprazole (PRILOSEC) 40 MG capsule Take 40 mg by mouth. 4 times a week     omeprazole (PRILOSEC) 40 MG capsule Take by mouth.     ondansetron (ZOFRAN) 4 MG tablet Take 4 mg by mouth as needed for nausea or vomiting.     oxyCODONE-acetaminophen (PERCOCET/ROXICET) 5-325 MG tablet Take 1 tablet by mouth as needed for moderate pain.     Ped Vitamins ACD Fl-Iron (TRI-VIT/FLUORIDE/IRON PO)  daily     pravastatin (PRAVACHOL) 20 MG tablet Take 20 mg by mouth in the morning.      Probiotic Product (PROBIOTIC DAILY PO) Take 1 tablet by mouth in the morning.      spironolactone (ALDACTONE) 50 MG tablet Take 50 mg by mouth in the morning.      tamsulosin (FLOMAX) 0.4 MG CAPS capsule Take 0.4 mg by mouth every evening.     thiamine 100 MG tablet Take 100 mg by mouth daily.     thiamine 100 MG tablet  daily, # 30     tiZANidine (ZANAFLEX) 2 MG tablet Take 2 mg by  mouth 2 (two) times daily as needed for muscle spasms.      triamcinolone cream (KENALOG) 0.1 % Apply 1 application topically 2 (two) times daily as needed (dry skin Itching).   3   venlafaxine (EFFEXOR) 75 MG tablet Take 75 mg by mouth 3 (three) times daily with meals.      venlafaxine XR (EFFEXOR-XR) 75 MG 24 hr capsule Take by mouth.     XIFAXAN 550 MG TABS tablet TAKE 1 TABLET BY MOUTH TWICE DAILY (Patient taking differently: Take 550 mg by mouth 2 (two) times daily.) 60 tablet 11   No current facility-administered medications for this visit.    ALLERGIES:  Allergies  Allergen Reactions   Tape Itching and Rash    PHYSICAL EXAM:  Performance status (ECOG): 1 - Symptomatic but completely ambulatory  Vitals:    08/08/20 1208  BP: 120/67  Pulse: 72  Resp: 17  Temp: 98.2 F (36.8 C)  SpO2: 96%   Wt Readings from Last 3 Encounters:  08/08/20 221 lb 9.6 oz (100.5 kg)  07/30/20 216 lb (98 kg)  06/06/20 220 lb 6.4 oz (100 kg)   Physical Exam Vitals reviewed.  Constitutional:      Appearance: Normal appearance. He is obese.     Comments: In wheelchair  Cardiovascular:     Rate and Rhythm: Normal rate and regular rhythm.     Pulses: Normal pulses.     Heart sounds: Normal heart sounds.  Pulmonary:     Effort: Pulmonary effort is normal.     Breath sounds: Normal breath sounds.  Musculoskeletal:     Right lower leg: Edema (2+) present.     Left lower leg: Edema (2+) present.  Neurological:     General: No focal deficit present.     Mental Status: He is alert and oriented to person, place, and time.  Psychiatric:        Mood and Affect: Mood normal.        Behavior: Behavior normal.     LABORATORY DATA:  I have reviewed the labs as listed.  CBC Latest Ref Rng & Units 08/01/2020 08/01/2020 07/11/2020  WBC 4.0 - 62.6 K/uL DUPLICATE 2.8(L) 3.1(L)  Hemoglobin 94.8 - 54.6 g/dL DUPLICATE 7.6(L) 7.1(L)  Hematocrit 27.0 - 35.0 % DUPLICATE 24.9(L) 23.0(L)  Platelets 093 - 818 K/uL DUPLICATE 29(H) 371(I)   CMP Latest Ref Rng & Units 08/01/2020 05/30/2020 04/04/2020  Glucose 70 - 99 mg/dL 214(H) 130(H) 221(H)  BUN 8 - 23 mg/dL 19 16 25(H)  Creatinine 0.61 - 1.24 mg/dL 2.73(H) 2.50(H) 3.08(H)  Sodium 135 - 145 mmol/L 129(L) 130(L) 127(L)  Potassium 3.5 - 5.1 mmol/L 3.8 4.1 3.7  Chloride 98 - 111 mmol/L 93(L) 93(L) 94(L)  CO2 22 - 32 mmol/L 26 28 26   Calcium 8.9 - 10.3 mg/dL 8.3(L) 8.2(L) 7.9(L)  Total Protein 6.5 - 8.1 g/dL 6.2(L) 6.2(L) -  Total Bilirubin 0.3 - 1.2 mg/dL 0.8 0.7 -  Alkaline Phos 38 - 126 U/L 191(H) 140(H) -  AST 15 - 41 U/L 20 18 -  ALT 0 - 44 U/L 15 15 -      Component Value Date/Time   RBC DUPLICATE 96/78/9381 0175   MCV DUPLICATE 03/25/8526 7824   MCV 82.0  23/53/6144 3154   MCH DUPLICATE 00/86/7619 5093   MCHC DUPLICATE 26/71/2458 0998   RDW DUPLICATE 33/82/5053 9767   LYMPHSABS PENDING 08/01/2020 1427   MONOABS PENDING 08/01/2020 1427   EOSABS PENDING 08/01/2020 1427  BASOSABS PENDING 08/01/2020 1427    DIAGNOSTIC IMAGING:  I have independently reviewed the scans and discussed with the patient. US Paracentesis  Result Date: 08/01/2020 INDICATION: Cirrhosis Recurrent ascites EXAM: ULTRASOUND GUIDED LLQ PARACENTESIS MEDICATIONS: 10 cc 1% lidocaine. COMPLICATIONS: None immediate. PROCEDURE: Informed written consent was obtained from the patient after a discussion of the risks, benefits and alternatives to treatment. A timeout was performed prior to the initiation of the procedure. Initial ultrasound scanning demonstrates a large amount of ascites within the left lower abdominal quadrant. The left lower abdomen was prepped and draped in the usual sterile fashion. 1% lidocaine was used for local anesthesia. Following this, a Yueh catheter was introduced. An ultrasound image was saved for documentation purposes. The paracentesis was performed. The catheter was removed and a dressing was applied. The patient tolerated the procedure well without immediate post procedural complication. Patient received post-procedure intravenous albumin; see nursing notes for details. FINDINGS: A total of approximately 3.4 liters of yellow fluid was removed. Samples were sent to the laboratory as requested by the clinical team. IMPRESSION: Successful ultrasound-guided paracentesis yielding 3.4 liters of peritoneal fluid. Read by Lavonia Drafts [facility] Electronically Signed   By: Lavonia Dana M.D.   On: 08/01/2020 14:17   US Paracentesis  Result Date: 07/11/2020 INDICATION: Recurrent ascites EXAM: ULTRASOUND GUIDED LLQ PARACENTESIS MEDICATIONS: 10 cc 1% lidocaine COMPLICATIONS: None immediate. PROCEDURE: Informed written consent was obtained from the patient after a discussion of  the risks, benefits and alternatives to treatment. A timeout was performed prior to the initiation of the procedure. Initial ultrasound scanning demonstrates a large amount of ascites within the left lower abdominal quadrant. The left lower abdomen was prepped and draped in the usual sterile fashion. 1% lidocaine was used for local anesthesia. Following this, a Yueh catheter was introduced. An ultrasound image was saved for documentation purposes. The paracentesis was performed. The catheter was removed and a dressing was applied. The patient tolerated the procedure well without immediate post procedural complication. Patient received post-procedure intravenous albumin; see nursing notes for details. FINDINGS: A total of approximately 3.7 liters of yellow fluid was removed. Samples were sent to the laboratory as requested by the clinical team. IMPRESSION: Successful ultrasound-guided paracentesis yielding 3.7 liters of peritoneal fluid. Read by Lavonia Drafts [facility] Electronically Signed   By: Marijo Conception M.D.   On: 07/11/2020 14:12     ASSESSMENT:  1.  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 -CTAP on 01/10/2016 showed splenomegaly. -Work-up included SPEP and immunofixation is negative. -Last Venofer from 06/25/2020 with through 07/09/2020, 800 mg. -He had multiple EGDs done in the past for varices.  2.  End-stage renal disease: -He gets dialysis Monday Wednesday Friday -He receives Epogen injections at his dialysis.   PLAN:  1.  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 -He denies any hematemesis.  He has occasional bleeding per rectum. -We reviewed labs from 08/01/2020.  Ferritin is 333.  Hemoglobin is between 7 and 8.  MCV is 104.6. -We have called and obtain the records from his dialysis center.  He is receiving 12,000 units on days of dialysis, 3 times a week. -He is considered to have  hyporesponsiveness to erythropoiesis stimulating agents unless he is having bleeding.  LDH is normal. -We will check his stool for occult blood.  If there is no bleeding, he will need bone marrow aspiration biopsy to evaluate for underlying MDS.  We will also check TSH and copper levels.  We will also consider [facility] panel down the line.  2.  Vitamin B12 deficiency: -His B12 is 668.  Continue B12 supplements.  3.  ESRD on HD: -He started on hemodialysis since January 2021.  He gets dialyzed on Monday, Wednesday and Friday at [facility] in [place] under the direction of Dr. Theador Hawthorne.  4.  End-stage liver disease with splenomegaly: -He has a mild leukopenia and moderate thrombocytopenia from splenomegaly.  We will closely monitor. -Last paracentesis on 08/01/2020 with 3.4 L yellow fluid removed.  Orders placed this encounter:  No orders of the defined types were placed in this encounter.    Derek Jack, MD [facility] [phone]   I, Milinda Antis, am acting as a scribe for Dr. Sanda Linger.  I, Derek Jack MD, have reviewed the above documentation for accuracy and completeness, and I agree with the above.

## 2020-08-15 ENCOUNTER — Other Ambulatory Visit (HOSPITAL_COMMUNITY): Payer: Self-pay | Admitting: *Deleted

## 2020-08-15 ENCOUNTER — Other Ambulatory Visit: Payer: Self-pay | Admitting: Vascular Surgery

## 2020-08-15 DIAGNOSIS — N186 End stage renal disease: Secondary | ICD-10-CM | POA: Diagnosis not present

## 2020-08-15 DIAGNOSIS — D62 Acute posthemorrhagic anemia: Secondary | ICD-10-CM

## 2020-08-15 LAB — OCCULT BLOOD X 1 CARD TO LAB, STOOL
Fecal Occult Bld: POSITIVE — AB
Fecal Occult Bld: POSITIVE — AB
Fecal Occult Bld: POSITIVE — AB

## 2020-08-15 NOTE — Progress Notes (Signed)
Per last office note, Dr. Delton Coombes wanted occult stool cards.  Orders placed and lab aware.

## 2020-08-19 ENCOUNTER — Telehealth: Payer: Self-pay

## 2020-08-19 DIAGNOSIS — K7031 Alcoholic cirrhosis of liver with ascites: Secondary | ICD-10-CM

## 2020-08-19 NOTE — Telephone Encounter (Signed)
Dr. Gala Romney, would you like any labs done on the fluid? Also okay to order albumin per protocol? Thank you!

## 2020-08-19 NOTE — Telephone Encounter (Signed)
See mychart message. Pt's spouse said pt needs a para completed this Tuesday or next Week. Pt's abdomen is distended and filling up with fluid.  Pt can't have a para on Mon-Weds or Fridays due to dialysis and pt has an appointment with his PCP this Thursday. Please advise.

## 2020-08-19 NOTE — Addendum Note (Signed)
Addended by: Cheron Every on: 08/19/2020 12:23 PM   Modules accepted: Orders

## 2020-08-19 NOTE — Telephone Encounter (Signed)
Called spouse. Aware PARA scheduled for tomorrow at 1:15pm. Nothing further needed

## 2020-08-19 NOTE — Telephone Encounter (Signed)
Lets shoot for tomorrow.

## 2020-08-19 NOTE — Telephone Encounter (Signed)
Routing to [place], please arrange para tomorrow 08/20/20 or next Tuesday 08/27/20.

## 2020-08-19 NOTE — Telephone Encounter (Signed)
Yes always a cell count with differential and anaerobic/aerobic culture.  Albumin if needed per protocol

## 2020-08-20 ENCOUNTER — Other Ambulatory Visit (HOSPITAL_COMMUNITY): Payer: Self-pay | Admitting: Diagnostic Radiology

## 2020-08-20 ENCOUNTER — Other Ambulatory Visit: Payer: Self-pay | Admitting: *Deleted

## 2020-08-20 ENCOUNTER — Encounter (HOSPITAL_COMMUNITY): Payer: Self-pay

## 2020-08-20 ENCOUNTER — Ambulatory Visit (HOSPITAL_COMMUNITY)
Admission: RE | Admit: 2020-08-20 | Discharge: 2020-08-20 | Disposition: A | Discharge status: Home / Self Care | Payer: Medicare Other | Source: Ambulatory Visit | Attending: Internal Medicine | Admitting: Internal Medicine

## 2020-08-20 ENCOUNTER — Other Ambulatory Visit: Payer: Self-pay

## 2020-08-20 DIAGNOSIS — K7031 Alcoholic cirrhosis of liver with ascites: Secondary | ICD-10-CM

## 2020-08-20 LAB — GRAM STAIN: Gram Stain: NONE SEEN

## 2020-08-20 LAB — BODY FLUID CELL COUNT WITH DIFFERENTIAL
Eos, Fluid: 3 %
Lymphs, Fluid: 57 %
Monocyte-Macrophage-Serous Fluid: 38 % — ABNORMAL LOW (ref 50–90)
Neutrophil Count, Fluid: 2 % (ref 0–25)
Total Nucleated Cell Count, Fluid: 112 cu mm (ref 0–1000)

## 2020-08-20 NOTE — Sedation Documentation (Signed)
PT tolerated right sided paracentesis procedure well today and 2.7 liters of clear yellow fluid removed with labs collected and sent for processing. Vital signs stable at completion of procedure and pt verbalized understanding of discharge instructions. PT left via wheelchair with wife today with NAD noted.

## 2020-08-20 NOTE — Procedures (Signed)
PreOperative Dx: Alcoholic cirrhosis, ascites Postoperative Dx: Alcoholic cirrhosis, ascites Procedure:   US guided paracentesis Radiologist:  Thornton Papas Anesthesia:  10 ml of1% lidocaine Specimen:  2.8 L of yellow ascitic fluid EBL:   < 1 ml Complications: None

## 2020-08-20 NOTE — Discharge Instructions (Signed)
Paracentesis, Care After This sheet gives you information about how to care for yourself after your procedure. Your health care provider may also give you more specific instructions. If you have problems or questions, contact your health care provider. What can I expect after the procedure? After the procedure, it is common to have a small amount of clear fluid coming from the puncture site. Follow these instructions at home: Puncture site care  Follow instructions from your health care provider about how to take care of your puncture site. Make sure you: ? Wash your hands with soap and water before and after you change your bandage (dressing). If soap and water are not available, use hand sanitizer. ? Change your dressing as told by your health care provider.  Check your puncture area every day for signs of infection. Check for: ? Redness, swelling, or pain. ? More fluid or blood. ? Warmth. ? Pus or a bad smell.   General instructions  Return to your normal activities as told by your health care provider. Ask your health care provider what activities are safe for you.  Take over-the-counter and prescription medicines only as told by your health care provider.  Do not take baths, swim, or use a hot tub until your health care provider approves. Ask your health care provider if you may take showers. You may only be allowed to take sponge baths.  Keep all follow-up visits as told by your health care provider. This is important. Contact a health care provider if:  You have redness, swelling, or pain at your puncture site.  You have more fluid or blood coming from your puncture site.  Your puncture site feels warm to the touch.  You have pus or a bad smell coming from your puncture site.  You have a fever. Get help right away if:  You have chest pain or shortness of breath.  You develop increasing pain, discomfort, or swelling in your abdomen.  You feel dizzy or light-headed or  you faint. Summary  After the procedure, it is common to have a small amount of clear fluid coming from the puncture site.  Follow instructions from your health care provider about how to take care of your puncture site.  Check your puncture area every day signs of infection.  Keep all follow-up visits as told by your health care provider. This information is not intended to replace advice given to you by your health care provider. Make sure you discuss any questions you have with your health care provider. Document Revised: 11/29/2018 Document Reviewed: 03/08/2018 Elsevier Patient Education  2021 Elsevier Inc.  

## 2020-08-20 NOTE — Patient Outreach (Signed)
[facility]) Care Management  08/20/2020  Jeffrey [name] [birth year] [id number]  Telephone Assessment-Successful-Diabetes  RN spoke with Jeffrey [name] (spouse) concerning the update today. Pt continue to do well with ongoing visits every few weeks to [facility] for fluid removal (liver). Reviewed and discussed the plan of care and verified pt remains on the plan and participates in his management of care. Care plan updated with documented notes within the care plan.  Pt continue to have prefect attendance to all his dialysis appointments weekly (no missed appointments).  Glucoses levels much improved with this AM at 140 (average). Reports pt continue to have some stressors but managing well. Spouse indicates pt needs more PT. Encouraged spouse to make a request via [name] to request HHPT involvement once again for therapy if pt qualifies. Pt also experiencing some right side pain. States oncologist has examined with no interventions (nothing found) however still having pain and will have pt's GI provider to explore more with possible scanning on the upcoming appointment.   Will continue to encouraged adherence with the plan of care discussed today and follow up next month with pt's progress.    Goals Addressed            This Visit's Progress   . THN-Disease Progression Prevented or Minimized   On track    Follow up date: 09/20/2020 Timeframe:  Short-Term Goal Priority:  Medium Start Date:     07/23/2020                        Expected End Date:  09/27/2020                      Evidence-based guidance:    Prepare patient for laboratory tests, such as lipid profile, microalbuminuria, estimated glomerular filtration rate and diagnostic exams based on risk and presentation.   Assess signs/symptoms and risk factors for hypertension, dyslipidemia, nephropathy, neuropathy and retinopathy.   Encourage lifestyle changes, such as increased intake of plant-based foods, stress reduction,  regular physical activity and smoking cessation to prevent long-term complications and chronic diseases.   Discourage sedentary behavior or sitting longer than 30 minutes without interruption; individualize exercise or activity recommendations with potential limitations, such as neuropathy, retinopathy or the ability to prevent  hyperglycemia or hypoglycemia.   Ensure completion of annual comprehensive foot exam at start of puberty or age 58 years, whichever is earlier, once the child has had diabetes for 5 years.     Implement additional individualized goals and interventions based on identified risk factors.   Prepare patient for consultation or referral for specialist care, such as ophthalmology, neurology, cardiology, podiatry, nephrology or perinatology. All that imply related to pt's medical history.    Notes: 3/22-Pt active with his ongoing HD 3 X weekly and attendance to all appointments.Visit to [facility] today for fluid retention (usually 1 liter removed). Pt goes every few weeks due to ongoing swelling to his LE.  Pt hx of anemia with Hgb 7 (being monitored). Spouse (Jeffrey [name]) feel pt may benefit from additional HHPT. Encouraged to make this request with [name] (receptive and she will inquire). Currently pt is using a RW unable to maintain stability with a cane at this time. Reports AM glucose at 140 and maintaining this average. Will continue to encouraged adherence and praised pt for his efforts in maintaining his health and adhering to all goals and interventions.  2/22-Will discuss all above and  verified pt's has seen both his ophthalmologist and podiatry over the last year. Pt has been hospitalized with this years appointments and will rescheduled for his annuals. Stress the importance of completing this task. Verified no symptoms of hyper/hypoglycemia have occurred over the last month since the last conversation. Reports last Ac1 6.0 last week at the providers office and the  last CBG read at 200. Aware this is elevated and RN case manager discuss ways to reduce this number. Pt has been lower in the past and has some stressors going on at this time.    . THN-Learn more abont my health   On track    Follow Up Date 09/20/2020 Timeframe:  Long-Range Goal Priority:  Medium Start Date:    06/20/2020                         Expected End Date:    09/27/2020                   - make a list of questions - ask questions - repeat what I heard to make sure I understand - bring a list of my medicines to the visit - speak up when I don't understand    Why is this important?    The best way to learn about your health and care is by talking to the doctor and nurse.   They will answer your questions and give you information in the way that you like best.    Notes:  1/20-[place]ange your diet, keep all appointments for diease related illness, follow all recommendations for treatment management and always utilize prevention measures to avoid acute encounters.  Will verbalize pt's understanding and verify with teach-back method.    Linward [place] and Keep All Appointments   On track    Follow Up Date : 09/20/2020 Timeframe:  Long-Range Goal Priority:  Medium Start Date:     05/23/2020                        Expected End Date:        09/27/2020                 - call to cancel if needed - keep a calendar with prescription refill dates - keep a calendar with appointment dates    Why is this important?   Part of staying healthy is seeing the doctor for follow-up care.  If you forget your appointments, there are some things you can do to stay on track.    Notes:   1/20-Spouse reports several appointments were rescheduled due to incremental weather but verified. Pt missed two dialysis appointment however was able to re-stabilize fluid removed via dialysis on another day. Will strongly encourage adherence with all medical appointments.  12/21-Verified pt continue to remain adherent  with all dialysis and medical appointments scheduled. Will extend to allow ongoing adherence.       Raina Mina, RN Care Management Coordinator [place] Office [phone]

## 2020-08-23 LAB — CYTOLOGY - NON PAP

## 2020-08-25 LAB — CULTURE, BODY FLUID W GRAM STAIN -BOTTLE: Culture: NO GROWTH

## 2020-09-03 ENCOUNTER — Ambulatory Visit (INDEPENDENT_AMBULATORY_CARE_PROVIDER_SITE_OTHER): Payer: Medicare Other

## 2020-09-03 ENCOUNTER — Other Ambulatory Visit: Payer: Self-pay

## 2020-09-03 DIAGNOSIS — R339 Retention of urine, unspecified: Secondary | ICD-10-CM | POA: Diagnosis not present

## 2020-09-03 NOTE — Patient Instructions (Signed)

## 2020-09-03 NOTE — Progress Notes (Signed)
Cath Change/ Replacement  Patient is present today for a catheter change due to urinary retention.  33ml of water was removed from the balloon, a 16FR foley cath was removed without difficulty.  Patient was cleaned and prepped in a sterile fashion with betadine. A 16 FR foley cath was replaced into the bladder no complications were noted Urine return was noted 59ml and urine was yellow in color. The balloon was filled with 32ml of sterile water. A leg bag was attached for drainage.  Patient was given proper instruction on catheter care.    Performed by: Taneesha Edgin, Lpn  Follow up: Keep next scheduled NV

## 2020-09-04 NOTE — Progress Notes (Deleted)
Referring Provider: Monico Blitz, MD Primary Care Physician:  Monico Blitz, MD Primary GI Physician: Dr. Gala Romney  No chief complaint on file.   HPI:   Jeffrey [name] is a 75 y.o. male presenting today for follow-up of end-stage decompensated alcoholic cirrhosis (MELD 27, Child pugh B-C (9-10)).  Cirrhosis is complicated by chronic volume overload with diuretics managed by nephrology and requiring frequent paracentesis for ascites, recurrent hepatic encephalopathy due to medication noncompliance, recurrent SBP (2020 and 2021) now on Levaquin at renal doses for prophylaxis, portal hypertensive gastropathy, grade 1-2 esophageal varices on EGD in April 2021 not on BB due to increased risk of SBP.  Also history of GERD, ESRD on dialysis, transfusion dependent anemia s/p extensive evaluation (EGD x2, colonoscopy, capsule endoscopy in 2021) now following with hematology. Last colonoscopy in January 2021 at outside facility with portal colopathy and diffuse edema, changes of C. difficile colitis. Stated there appeared to be 3 polyps that were not removed due to concerns for bleeding.    Last seen in our office 07/30/2020 by Dr. Gala Romney.  Ascites continue to be challenging and patient felt he needed another tap.  Last hemoglobin was 7.1 with no further rectal bleeding or melena.  Reported he was miserable at times with significant daily reflux symptoms taking omeprazole 40 mg 4 times weekly with no dysphagia.  He also reported he was visited at home by palliative nurse once a month.  Recommended resuming omeprazole 40 mg daily as benefits for quality of life outweigh risk.  Would also schedule paracentesis with fluid analysis, update labs, continue diuretics and 2 g sodium diet, continue lactulose and Xifaxan, follow-up in 4 weeks.  Paracentesis 08/01/2020 yielding 3.4 L.  No SBP. Paracentesis 08/20/2020 yielding 2.8 L.  No SBP.  Labs 08/01/2020: Hemoglobin stable/improved at 7.6, INR stable at 1.2, CMP fairly  stable with elevated alk phos at 191, otherwise LFTs within normal limits, creatinine 2.73, sodium 129.  Today:    Past Medical History:  Diagnosis Date  . A-fib [facility])    when initially stating dialysis january 2021 at [facility] per spouse; never heard anything else about it  . Alcoholic cirrhosis ([place])    patient reports completing hep a and b vaccines in 2006  . Anemia    has had 3 units of prbcs 2013  . Anxiety   . Arthritis   . Asymptomatic gallstones    Ultrasound in 2006  . C. difficile colitis   . Cholelithiasis   . Chronic kidney disease    Dialysis M/W/F/Sa  . Depression   . Diabetes ([place])   . Duodenal ulcer with hemorrhage    per patient in 2006 or 2007, records have been requested  . Dyspnea    low oxygen level -  has O2 2 L all day  . 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 GERD (gastroesophageal reflux disease)   . Heart burn   . 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 HOH (hard of hearing)   . Hypertension   . Liver cirrhosis ([place])   . SBP (spontaneous bacterial peritonitis) ([place])    2020, November 2021.   [name] Splenomegaly    Ultrasound in 2006    Past Surgical History:  Procedure Laterality Date  . AGILE CAPSULE N/A 09/20/2019   Procedure: AGILE CAPSULE;  Surgeon: Daneil Dolin, MD;  [facility];  Service: Endoscopy;  Laterality: N/A;  . APPENDECTOMY    .  AV FISTULA PLACEMENT Left 08/17/2019   Procedure: ARTERIOVENOUS (AV) FISTULA CREATION VERSES ARTERIOVENOUS GRAFT;  Surgeon: [name], MD;  Location: [place];  Service: Vascular;  Laterality: Left;  . BASCILIC VEIN TRANSPOSITION Left 09/28/2019   Procedure: LEFT SECOND STAGE BASCILIC VEIN TRANSPOSITION;  Surgeon: [name], MD;  Location: [place];  Service: Vascular;  Laterality: Left;  . BIOPSY  02/03/2018   Procedure: BIOPSY;  Surgeon: Daneil Dolin, MD;  [facility];  Service: Endoscopy;;  bx of gastric polyps  . BIOPSY  09/19/2019   Procedure: BIOPSY;   Surgeon: Danie Binder, MD;  [facility];  Service: Endoscopy;;  . CATARACT EXTRACTION Right   . COLONOSCOPY  03/10/2005   Rectal polyp as described above, removed with snare. Left sided  diverticula. The remainder of the colonic mucosa appeared normal. Inflammed polyp on path.  . COLONOSCOPY  04/1999   Dr. Thea Silversmith polyps removed,  Path showed hyperplastic  . COLONOSCOPY  10/2015   Dr. Britta Mccreedy: diverticulosis, single sessile tubular adenoma 3-36m in size removed from descending colon.   . COLONOSCOPY WITH ESOPHAGOGASTRODUODENOSCOPY (EGD)  03/31/2012   RWLN:[id number]. Colonic diverticulosis. tubular adenoma colon  . COLONOSCOPY WITH ESOPHAGOGASTRODUODENOSCOPY (EGD)  06/2019   FORSYTH: small esophageal varices without high risk stigmata, single large AVM on lesser curvature in gastric body s/p APC ablation, gastric antral and duodenal bulb polyposis. No significant source to explain transfusion dependent anemia. Colonoscopy with portal colopathy and diffuse edema, changes of Cdiff colitis on colonoscopy  . ESOPHAGEAL BANDING N/A 02/03/2018   Procedure: ESOPHAGEAL BANDING;  Surgeon: RDaneil Dolin MD;  [facility];  Service: Endoscopy;  Laterality: N/A;  . ESOPHAGOGASTRODUODENOSCOPY  01/15/2005   Three columns grade 1 to 2 esophageal varices, otherwise normal esophageal mucosa.  Esophagus was not manipulated otherwise./Nodularity of the antrum with overlying erosions, nonspecific finding. Path showed rare H.pylori  . ESOPHAGOGASTRODUODENOSCOPY  09/2008   Dr. RGaylene Brookscolumns of grade 2-3 esoph varices, only one column was prominent. Portal gastropathy, multiple gastrc polyps at antrum, two were 2cm with black eschar, bulbar polyps, bulbar erosions  . ESOPHAGOGASTRODUODENOSCOPY  11/2004   Dr. FBrantley Stage3 esoph varices  . ESOPHAGOGASTRODUODENOSCOPY  03/31/2012   RMR: 4 columns(3-GR 2, 1-GR1) non-bleeding esophageal varices, portal gastropathy, small HH,  early GAVE, multiple gastric polyps   . ESOPHAGOGASTRODUODENOSCOPY (EGD) WITH PROPOFOL N/A 02/03/2018   Dr. RGala Romney Esophageal varices, 3 columns grade 1-2.  Portal hypertensive gastropathy.  Multiple gastric polyps, biopsy consistent with hyperplastic.  .Marland KitchenESOPHAGOGASTRODUODENOSCOPY (EGD) WITH PROPOFOL N/A 09/19/2019   Fields: grade I and II esophageal varcies, mild portal hypertensive gastropathy, moderate gastritis but no H. pylori, single hyperplastic gastric polyp removed, obvious source for melena/transfusion dependent anemia not identified, may be due to friable gastric and duodenal mucosa in the setting of portal hypertension  . GASTRIC VARICES BANDING  03/31/2012   Procedure: GASTRIC VARICES BANDING;  Surgeon: RDaneil Dolin MD;  [facility];  Service: Endoscopy;  Laterality: N/A;  . GIVENS CAPSULE STUDY N/A 09/21/2019   Poor study with a lot of debris obstructing much of the view of the first approximate 3 hours out of 6 hours.  No obvious source of bleeding identified.  Sequela of bleeding and old blood seen in the form of"whisps" of blood, blood flecks mostly toward the end of the study.  . IR REMOVAL TUN CV CATH W/O FL  02/15/2020  . POLYPECTOMY  09/19/2019   Procedure: POLYPECTOMY;  Surgeon: Danie Binder, MD;  [facility];  Service: Endoscopy;;  . UMBILICAL HERNIA REPAIR  2017   exploratory laparotomy, abdominal washout    Current Outpatient Medications  Medication Sig Dispense Refill  . acetaminophen (TYLENOL) 500 MG tablet Take 1,000 mg by mouth as needed for mild pain.    . Alcohol Swabs (ALCOHOL PREP) 70 % PADS Apply 1 each topically 3 (three) times daily.    . Ascorbic Acid (VITAMIN C) 1000 MG tablet Take 1,000 mg by mouth 2 (two) times daily.    . calcium carbonate (TUMS - DOSED IN MG ELEMENTAL CALCIUM) 500 MG chewable tablet Chew 1 tablet by mouth 3 (three) times daily with meals.    . Calcium Carbonate-Simethicone (ALKA-SELTZER HEARTBURN + GAS) 750-80  MG CHEW Chew by mouth as needed.    . diltiazem (CARDIZEM) 120 MG tablet Take by mouth.    . diltiazem (TIAZAC) 120 MG 24 hr capsule Take 120 mg by mouth daily.    . furosemide (LASIX) 40 MG tablet Take 40 mg by mouth 2 (two) times daily.    . furosemide (LASIX) 80 MG tablet Take by mouth.    . gabapentin (NEURONTIN) 100 MG capsule TAKE 1 CAPSULE BY MOUTH AT BEDTIME 90 capsule 0  . gabapentin (NEURONTIN) 100 MG capsule Take by mouth.    . hydrocortisone (ANUSOL-HC) 2.5 % rectal cream Place 1 application rectally 2 (two) times daily. (Patient taking differently: Place 1 application rectally 2 (two) times daily as needed for hemorrhoids or anal itching.) 30 g 1  . hydrOXYzine (VISTARIL) 25 MG capsule Take 25 mg by mouth 3 (three) times daily as needed for itching.     . Insulin Degludec (TRESIBA FLEXTOUCH [place]) Inject into the skin.    [name] insulin degludec (TRESIBA FLEXTOUCH) 200 UNIT/ML FlexTouch Pen Inject 120 Units into the skin at bedtime. When blood sugars are up (Patient taking differently: Inject 120 Units into the skin at bedtime.)    . iron polysaccharides (NIFEREX) 150 MG capsule Take 150 mg by mouth at bedtime.     [name] lactulose (CONSTULOSE) 10 GM/15ML solution TAKE 45 MLS EVERY MORNING AND TAKE 45 MLS EVERY EVENING (Patient taking differently: TAKE 45 MLS EVERY MORNING AND TAKE 30-45 MLS EVERY EVENING) 2700 mL 12  . Lancet Devices (ADJUSTABLE LANCING DEVICE) MISC Apply 1 each topically daily.    [name] levofloxacin (LEVAQUIN) 500 MG tablet Take 500 mg 3 times a week after dialysis. 12 tablet 11  . metoprolol succinate (TOPROL-XL) 25 MG 24 hr tablet Take 1 tablet (25 mg total) by mouth daily. Take at bedtime  on Monday ,Wednesday and Friday due to dialaylisis (Patient taking differently: Take 25 mg by mouth See admin instructions. Take 25 mg by mouth at bedtime every night and 25 mg in morning on  Sun, Tues, Thurs and Sat)    . metoprolol succinate (TOPROL-XL) 50 MG 24 hr tablet Take 50 mg by mouth 2  (two) times daily.    . metoprolol tartrate (LOPRESSOR) 25 MG tablet Take by mouth.    . midodrine (PROAMATINE) 10 MG tablet Take 1 tablet (10 mg total) by mouth 2 (two) times daily with a meal. 60 tablet 1  . milk thistle 175 MG tablet Take 175 mg by mouth 3 (three) times daily.     . mirtazapine (REMERON) 30 MG tablet Take 1 tablet (30 mg total) by mouth at bedtime. 30 tablet 3  . Multiple Vitamin (MULTIVITAMIN WITH MINERALS) TABS tablet Take  1 tablet by mouth in the morning.     [name] NOVOFINE 32G X 6 MM MISC Inject into the skin.    [name] nystatin (MYCOSTATIN/NYSTOP) powder Apply topically 3 (three) times daily. Into groin area and affected area (Patient taking differently: Apply topically as needed. Into groin area and affected area) 45 g 0  . Omega-3 1000 MG CAPS  ( 200 mg ), bid, # 120    . omega-3 acid ethyl esters (LOVAZA) 1 g capsule Take 2 g by mouth 2 (two) times daily.     [name] omeprazole (PRILOSEC) 40 MG capsule Take 40 mg by mouth. 4 times a week    . omeprazole (PRILOSEC) 40 MG capsule Take by mouth.    . ondansetron (ZOFRAN) 4 MG tablet Take 4 mg by mouth as needed for nausea or vomiting.    [name] oxyCODONE-acetaminophen (PERCOCET/ROXICET) 5-325 MG tablet Take 1 tablet by mouth as needed for moderate pain.    . Ped Vitamins ACD Fl-Iron (TRI-VIT/FLUORIDE/IRON PO)  daily    . pravastatin (PRAVACHOL) 20 MG tablet Take 20 mg by mouth in the morning.     . Probiotic Product (PROBIOTIC DAILY PO) Take 1 tablet by mouth in the morning.     [name] spironolactone (ALDACTONE) 50 MG tablet Take 50 mg by mouth in the morning.     . tamsulosin (FLOMAX) 0.4 MG CAPS capsule Take 0.4 mg by mouth every evening.    . thiamine 100 MG tablet Take 100 mg by mouth daily.    [name] thiamine 100 MG tablet  daily, # 30    . tiZANidine (ZANAFLEX) 2 MG tablet Take 2 mg by mouth 2 (two) times daily as needed for muscle spasms.     [name] triamcinolone cream (KENALOG) 0.1 % Apply 1 application topically 2 (two) times daily as needed  (dry skin Itching).   3  . venlafaxine (EFFEXOR) 75 MG tablet Take 75 mg by mouth 3 (three) times daily with meals.     . venlafaxine XR (EFFEXOR-XR) 75 MG 24 hr capsule Take by mouth.    [name] XIFAXAN 550 MG TABS tablet TAKE 1 TABLET BY MOUTH TWICE DAILY (Patient taking differently: Take 550 mg by mouth 2 (two) times daily.) 60 tablet 11   No current facility-administered medications for this visit.    Allergies as of 09/05/2020 - Review Complete 08/20/2020  Allergen Reaction Noted  . Tape Itching and Rash 09/18/2019    Family History  Problem Relation Age of Onset  . Colon cancer Father 29       deceased age 19  . Breast cancer Sister        deceased  . Diabetes Sister   . Stroke Mother   . Healthy Son   . Healthy Daughter   . Lung cancer Neg Hx   . Ovarian cancer Neg Hx     Social History   Socioeconomic History  . Marital status: Married    Spouse name: Not on file  . Number of children: 2  . Years of education: Not on file  . Highest education level: Not on file  Occupational History  . Occupation: RETIRED    Employer: SELF EMPLOYED  Tobacco Use  . Smoking status: Former Smoker    Packs/day: 2.00    Years: 25.00    Pack years: 50.00    Types: Cigarettes    Quit date: 01/28/1986    Years since quitting: 34.6  . Smokeless tobacco: Never Used  Vaping Use  .  Vaping Use: Never used  Substance and Sexual Activity  . Alcohol use: No    Comment: quit in 05/2013  . Drug use: No  . Sexual activity: Not Currently  Other Topics Concern  . Not on file  Social History Narrative   Two step children from second marriage (divorced) who live with him along with some grandchildren.    Social Determinants of Health   Financial Resource Strain: Not on file  Food Insecurity: No Food Insecurity  . Worried About Charity fundraiser in the Last Year: Never true  . Ran Out of Food in the Last Year: Never true  Transportation Needs: No Transportation Needs  . Lack of  Transportation (Medical): No  . Lack of Transportation (Non-Medical): No  Physical Activity: Inactive  . Days of Exercise per Week: 0 days  . Minutes of Exercise per Session: 0 min  Stress: Not on file  Social Connections: Not on file    Review of Systems: Gen: Denies fever, chills, anorexia. Denies fatigue, weakness, weight loss.  CV: Denies chest pain, palpitations, syncope, peripheral edema, and claudication. Resp: Denies dyspnea at rest, cough, wheezing, coughing up blood, and pleurisy. GI: Denies vomiting blood, jaundice, and fecal incontinence.   Denies dysphagia or odynophagia. Derm: Denies rash, itching, dry skin Psych: Denies depression, anxiety, memory loss, confusion. No homicidal or suicidal ideation.  Heme: Denies bruising, bleeding, and enlarged lymph nodes.  Physical Exam: There were no vitals taken for this visit. General:   Alert and oriented. No distress noted. Pleasant and cooperative.  Head:  Normocephalic and atraumatic. Eyes:  Conjuctiva clear without scleral icterus. Mouth:  Oral mucosa pink and moist. Good dentition. No lesions. Heart:  S1, S2 present without murmurs appreciated. Lungs:  Clear to auscultation bilaterally. No wheezes, rales, or rhonchi. No distress.  Abdomen:  +BS, soft, non-tender and non-distended. No rebound or guarding. No HSM or masses noted. Msk:  Symmetrical without gross deformities. Normal posture. Extremities:  Without edema. Neurologic:  Alert and  oriented x4 Psych:  Alert and cooperative. Normal mood and affect.

## 2020-09-05 ENCOUNTER — Telehealth: Payer: Self-pay

## 2020-09-05 ENCOUNTER — Ambulatory Visit: Payer: Medicare Other | Admitting: Gastroenterology

## 2020-09-05 NOTE — Telephone Encounter (Signed)
Rescheduled and appt card mailed

## 2020-09-05 NOTE — Telephone Encounter (Signed)
Noted  

## 2020-09-05 NOTE — Telephone Encounter (Signed)
Received a mychart message from pts spouse. Pt's spouse is the caregiver of pt and she is very sick today. Pt won't be able to make his appointment. Please r/s apt. Apt can't be on a Monday, Wednesday or Friday due to dialysis.

## 2020-09-09 ENCOUNTER — Telehealth: Payer: Self-pay

## 2020-09-09 ENCOUNTER — Other Ambulatory Visit: Payer: Self-pay | Admitting: *Deleted

## 2020-09-09 DIAGNOSIS — R188 Other ascites: Secondary | ICD-10-CM

## 2020-09-09 NOTE — Telephone Encounter (Signed)
Yes

## 2020-09-09 NOTE — Telephone Encounter (Signed)
PARA scheduled for 4/14 at 10:00am, arrival 9:45am  Called [facility] with appt details.

## 2020-09-09 NOTE — Telephone Encounter (Signed)
[facility] Clinical- please arrange para on a Tuesday or Thursday due to pts dialysis on Mon, Wed, Frid.

## 2020-09-09 NOTE — Telephone Encounter (Signed)
Mychart message received from pts spouse. Pt needs more fluid drawn off of his stomach. Last paracentesis was 08/20/2020. Please advise if para can be arranged.

## 2020-09-12 ENCOUNTER — Encounter (HOSPITAL_COMMUNITY): Payer: Self-pay

## 2020-09-12 ENCOUNTER — Ambulatory Visit (HOSPITAL_COMMUNITY)
Admission: RE | Admit: 2020-09-12 | Discharge: 2020-09-12 | Disposition: A | Discharge status: Home / Self Care | Payer: Medicare Other | Source: Ambulatory Visit | Attending: Internal Medicine | Admitting: Internal Medicine

## 2020-09-12 DIAGNOSIS — R188 Other ascites: Secondary | ICD-10-CM | POA: Insufficient documentation

## 2020-09-12 LAB — BODY FLUID CELL COUNT WITH DIFFERENTIAL
Eos, Fluid: 0 %
Lymphs, Fluid: 57 %
Monocyte-Macrophage-Serous Fluid: 37 % — ABNORMAL LOW (ref 50–90)
Neutrophil Count, Fluid: 3 % (ref 0–25)
Other Cells, Fluid: 3 %
Total Nucleated Cell Count, Fluid: 140 cu mm (ref 0–1000)

## 2020-09-12 LAB — GRAM STAIN: Gram Stain: NONE SEEN

## 2020-09-12 NOTE — Procedures (Signed)
   US guided LLQ paracentesis  3.5 L yellow fluid Tolerated well Sent for labs per MD  EBL: less than 1 cc

## 2020-09-12 NOTE — Progress Notes (Signed)
Paracentesis complete no signs of distress.  

## 2020-09-16 LAB — PATHOLOGIST SMEAR REVIEW

## 2020-09-17 ENCOUNTER — Other Ambulatory Visit: Payer: Self-pay | Admitting: *Deleted

## 2020-09-17 LAB — CULTURE, BODY FLUID W GRAM STAIN -BOTTLE: Culture: NO GROWTH

## 2020-09-17 NOTE — Patient Outreach (Signed)
[facility]) Care Management  09/17/2020  Jeffrey [name] 1946-02-18 [id number]   Telephone Assessment-Successful-Diabetes  RN spoke with pt's spouse who provided an updated. States pt has had a fall but no residual injuries other then some healing bruises to his toes and knees. Strongly encourage spouse to inform pt to use his assistive devices for fall prevention and ongoing safety.  Reviewed and discussed plan of care with all goals and interventions. Plan of care updated accordingly with notations related to pt's progress.  Will follow up next month related to pt's upcoming appointments and pending lab work . No requested as all questions addressed with no additional needs on this call.  Goals Addressed            This Visit's Progress   . [facility]-Disease Progression Prevented or Minimized   On track    Follow up date: 10/21/2020 Timeframe:  Short-Term Goal Priority:  Medium Start Date:     07/23/2020                        Expected End Date:  10/29/2020                      Evidence-based guidance:    Prepare patient for laboratory tests, such as lipid profile, microalbuminuria, estimated glomerular filtration rate and diagnostic exams based on risk and presentation.   Assess signs/symptoms and risk factors for hypertension, dyslipidemia, nephropathy, neuropathy and retinopathy.   Encourage lifestyle changes, such as increased intake of plant-based foods, stress reduction, regular physical activity and smoking cessation to prevent long-term complications and chronic diseases.   Discourage sedentary behavior or sitting longer than 30 minutes without interruption; individualize exercise or activity recommendations with potential limitations, such as neuropathy, retinopathy or the ability to prevent  hyperglycemia or hypoglycemia.   Ensure completion of annual comprehensive foot exam at start of puberty or age 76 years, whichever is earlier, once the child has had  diabetes for 5 years.     Implement additional individualized goals and interventions based on identified risk factors.   Prepare patient for consultation or referral for specialist care, such as ophthalmology, neurology, cardiology, podiatry, nephrology or perinatology. All that imply related to pt's medical history.    Notes: 4/19-Spouse reports pt continue HD 3 X weekly with no missed appointments. Pt continue to attend all medical appointments with sufficient transportation. No medication changes as pt remain adherent to taking all his medications. Pending appointments discussed with Oncologist on 4/21 and [name] for labs on 4/26. Pt had two falls several weeks ago with some bruises that are healing to his knees and toes. Verified pt did not have a head injury and did not require hospital or ED visit. Encouraged ongoing use of his DME RW for safety precaution.  3/22-Pt active with his ongoing HD 3 X weekly and attendance to all appointments.Visit to [name] today for fluid retention (usually 1 liter removed). Pt goes every few weeks due to ongoing swelling to his LE.  Pt hx of anemia with Hgb 7 (being monitored). Spouse ([name]) feel pt may benefit from additional HHPT. Encouraged to make this request with [name] (receptive and she will inquire). Currently pt is using a RW unable to maintain stability with a cane at this time. Reports AM glucose at 140 and maintaining this average. Will continue to encouraged adherence and praised pt for his efforts in maintaining his health and adhering  to all goals and interventions.  2/22-Will discuss all above and verified pt's has seen both his ophthalmologist and podiatry over the last year. Pt has been hospitalized with this years appointments and will rescheduled for his annuals. Stress the importance of completing this task. Verified no symptoms of hyper/hypoglycemia have occurred over the last month since the last conversation. Reports last  Ac1 6.0 last week at the providers office and the last CBG read at 200. Aware this is elevated and RN case manager discuss ways to reduce this number. Pt has been lower in the past and has some stressors going on at this time.    . [facility]N-Learn more abont my health   On track    Follow Up Date 10/21/2020 Timeframe:  Long-Range Goal Priority:  Medium Start Date:    06/20/2020                         Expected End Date:    12/27/2020                   - make a list of questions - ask questions - repeat what I heard to make sure I understand - bring a list of my medicines to the visit - speak up when I don't understand    Why is this important?    The best way to learn about your health and care is by talking to the doctor and nurse.   They will answer your questions and give you information in the way that you like best.    Notes:  4/19- Will continue to discussed long term goals and interventions related to understanding of all his medications and all his medical conditions that require daily monitoring.                 1/20-Mange your diet, keep all appointments for diease related illness, follow all recommendations for treatment management and always utilize prevention measures to avoid acute encounters.  Will verbalize pt's understanding and verify with teach-back method.    [facility] [place] and Keep All Appointments   On track    Follow Up Date : 10/21/2020 Timeframe:  Long-Range Goal Priority:  Medium Start Date:     05/23/2020                        Expected End Date:        12/27/2020                 - call to cancel if needed - keep a calendar with prescription refill dates - keep a calendar with appointment dates    Why is this important?   Part of staying healthy is seeing the doctor for follow-up care.  If you forget your appointments, there are some things you can do to stay on track.    Notes:  4/19 Will extend this goal to allow ongoing adherence as pt has missed some HD  appointments over the last 3 months. Continue to stress the importance of pt attending all his medical appointments to avoid "no show" fees and acute events from happending related to missed HD appointments.  1/20-Spouse reports several appointments were rescheduled due to incremental weather but verified. Pt missed two dialysis appointment however was able to re-stabilize fluid removed via dialysis on another day. Will strongly encourage adherence with all medical appointments.  12/21-Verified pt continue to remain adherent with all dialysis and  medical appointments scheduled. Will extend to allow ongoing adherence.       Raina Mina, RN Care Management Coordinator [place] [facility] [phone]

## 2020-09-19 ENCOUNTER — Other Ambulatory Visit: Payer: Self-pay

## 2020-09-19 ENCOUNTER — Inpatient Hospital Stay (HOSPITAL_COMMUNITY): Payer: Medicare Other | Attending: Hematology

## 2020-09-19 DIAGNOSIS — D631 Anemia in chronic kidney disease: Secondary | ICD-10-CM | POA: Insufficient documentation

## 2020-09-19 DIAGNOSIS — Z79899 Other long term (current) drug therapy: Secondary | ICD-10-CM | POA: Diagnosis not present

## 2020-09-19 DIAGNOSIS — K922 Gastrointestinal hemorrhage, unspecified: Secondary | ICD-10-CM | POA: Insufficient documentation

## 2020-09-19 DIAGNOSIS — R161 Splenomegaly, not elsewhere classified: Secondary | ICD-10-CM | POA: Insufficient documentation

## 2020-09-19 DIAGNOSIS — D509 Iron deficiency anemia, unspecified: Secondary | ICD-10-CM | POA: Insufficient documentation

## 2020-09-19 DIAGNOSIS — E538 Deficiency of other specified B group vitamins: Secondary | ICD-10-CM | POA: Diagnosis not present

## 2020-09-19 DIAGNOSIS — K7210 Chronic hepatic failure without coma: Secondary | ICD-10-CM | POA: Diagnosis not present

## 2020-09-19 DIAGNOSIS — Z800 Family history of malignant neoplasm of digestive organs: Secondary | ICD-10-CM | POA: Diagnosis not present

## 2020-09-19 DIAGNOSIS — D72819 Decreased white blood cell count, unspecified: Secondary | ICD-10-CM | POA: Insufficient documentation

## 2020-09-19 DIAGNOSIS — Z87891 Personal history of nicotine dependence: Secondary | ICD-10-CM | POA: Diagnosis not present

## 2020-09-19 DIAGNOSIS — N186 End stage renal disease: Secondary | ICD-10-CM | POA: Insufficient documentation

## 2020-09-19 DIAGNOSIS — D696 Thrombocytopenia, unspecified: Secondary | ICD-10-CM | POA: Diagnosis not present

## 2020-09-19 DIAGNOSIS — K649 Unspecified hemorrhoids: Secondary | ICD-10-CM | POA: Diagnosis not present

## 2020-09-19 DIAGNOSIS — Z992 Dependence on renal dialysis: Secondary | ICD-10-CM | POA: Insufficient documentation

## 2020-09-19 DIAGNOSIS — Z803 Family history of malignant neoplasm of breast: Secondary | ICD-10-CM | POA: Diagnosis not present

## 2020-09-19 DIAGNOSIS — D500 Iron deficiency anemia secondary to blood loss (chronic): Secondary | ICD-10-CM

## 2020-09-19 LAB — LACTATE DEHYDROGENASE: LDH: 118 U/L (ref 98–192)

## 2020-09-19 LAB — CBC WITH DIFFERENTIAL/PLATELET
Abs Immature Granulocytes: 0.01 10*3/uL (ref 0.00–0.07)
Basophils Absolute: 0 10*3/uL (ref 0.0–0.1)
Basophils Relative: 1 %
Eosinophils Absolute: 0.1 10*3/uL (ref 0.0–0.5)
Eosinophils Relative: 5 %
HCT: 24.2 % — ABNORMAL LOW (ref 39.0–52.0)
Hemoglobin: 7 g/dL — ABNORMAL LOW (ref 13.0–17.0)
Immature Granulocytes: 0 %
Lymphocytes Relative: 9 %
Lymphs Abs: 0.3 10*3/uL — ABNORMAL LOW (ref 0.7–4.0)
MCH: 31.1 pg (ref 26.0–34.0)
MCHC: 28.9 g/dL — ABNORMAL LOW (ref 30.0–36.0)
MCV: 107.6 fL — ABNORMAL HIGH (ref 80.0–100.0)
Monocytes Absolute: 0.2 10*3/uL (ref 0.1–1.0)
Monocytes Relative: 8 %
Neutro Abs: 2.1 10*3/uL (ref 1.7–7.7)
Neutrophils Relative %: 77 %
Platelets: 75 10*3/uL — ABNORMAL LOW (ref 150–400)
RBC: 2.25 MIL/uL — ABNORMAL LOW (ref 4.22–5.81)
RDW: 17.7 % — ABNORMAL HIGH (ref 11.5–15.5)
WBC: 2.7 10*3/uL — ABNORMAL LOW (ref 4.0–10.5)
nRBC: 0 % (ref 0.0–0.2)

## 2020-09-19 LAB — RETICULOCYTES
Immature Retic Fract: 27.3 % — ABNORMAL HIGH (ref 2.3–15.9)
RBC.: 2.25 MIL/uL — ABNORMAL LOW (ref 4.22–5.81)
Retic Count, Absolute: 61.4 10*3/uL (ref 19.0–186.0)
Retic Ct Pct: 2.7 % (ref 0.4–3.1)

## 2020-09-19 LAB — IRON AND TIBC
Iron: 53 ug/dL (ref 45–182)
Saturation Ratios: 21 % (ref 17.9–39.5)
TIBC: 256 ug/dL (ref 250–450)
UIBC: 203 ug/dL

## 2020-09-19 LAB — C-REACTIVE PROTEIN: CRP: 9.8 mg/dL — ABNORMAL HIGH (ref <1.0)

## 2020-09-19 LAB — TSH: TSH: 4.3 u[IU]/mL (ref 0.350–4.500)

## 2020-09-19 LAB — VITAMIN B12: Vitamin B-12: 635 pg/mL (ref 180–914)

## 2020-09-19 LAB — SEDIMENTATION RATE: Sed Rate: 100 mm/hr — ABNORMAL HIGH (ref 0–16)

## 2020-09-19 LAB — FERRITIN: Ferritin: 181 ng/mL (ref 24–336)

## 2020-09-20 ENCOUNTER — Ambulatory Visit: Payer: Self-pay | Admitting: *Deleted

## 2020-09-20 LAB — PTH, INTACT AND CALCIUM
Calcium, Total (PTH): 8.3 mg/dL — ABNORMAL LOW (ref 8.6–10.2)
PTH: 23 pg/mL (ref 15–65)

## 2020-09-22 LAB — COPPER, SERUM: Copper: 150 ug/dL — ABNORMAL HIGH (ref 69–132)

## 2020-09-24 ENCOUNTER — Inpatient Hospital Stay (HOSPITAL_COMMUNITY): Payer: Medicare Other | Admitting: Hematology

## 2020-09-24 ENCOUNTER — Encounter (HOSPITAL_COMMUNITY): Payer: Self-pay | Admitting: Hematology

## 2020-09-24 ENCOUNTER — Telehealth: Payer: Self-pay

## 2020-09-24 ENCOUNTER — Other Ambulatory Visit (HOSPITAL_COMMUNITY)
Admission: RE | Admit: 2020-09-24 | Discharge: 2020-09-24 | Disposition: A | Discharge status: Home / Self Care | Payer: Medicare Other | Source: Ambulatory Visit | Attending: Hematology | Admitting: Hematology

## 2020-09-24 ENCOUNTER — Other Ambulatory Visit: Payer: Self-pay

## 2020-09-24 VITALS — BP 139/59 | HR 84 | Temp 96.9°F | Resp 20 | Wt 236.0 lb

## 2020-09-24 DIAGNOSIS — D500 Iron deficiency anemia secondary to blood loss (chronic): Secondary | ICD-10-CM | POA: Diagnosis present

## 2020-09-24 DIAGNOSIS — D649 Anemia, unspecified: Secondary | ICD-10-CM | POA: Diagnosis not present

## 2020-09-24 DIAGNOSIS — D62 Acute posthemorrhagic anemia: Secondary | ICD-10-CM

## 2020-09-24 DIAGNOSIS — N186 End stage renal disease: Secondary | ICD-10-CM | POA: Diagnosis not present

## 2020-09-24 LAB — CBC WITH DIFFERENTIAL/PLATELET
Abs Immature Granulocytes: 0 10*3/uL (ref 0.00–0.07)
Basophils Absolute: 0 10*3/uL (ref 0.0–0.1)
Basophils Relative: 1 %
Eosinophils Absolute: 0.2 10*3/uL (ref 0.0–0.5)
Eosinophils Relative: 5 %
HCT: 25.1 % — ABNORMAL LOW (ref 39.0–52.0)
Hemoglobin: 7.5 g/dL — ABNORMAL LOW (ref 13.0–17.0)
Immature Granulocytes: 0 %
Lymphocytes Relative: 9 %
Lymphs Abs: 0.3 10*3/uL — ABNORMAL LOW (ref 0.7–4.0)
MCH: 32.1 pg (ref 26.0–34.0)
MCHC: 29.9 g/dL — ABNORMAL LOW (ref 30.0–36.0)
MCV: 107.3 fL — ABNORMAL HIGH (ref 80.0–100.0)
Monocytes Absolute: 0.2 10*3/uL (ref 0.1–1.0)
Monocytes Relative: 7 %
Neutro Abs: 2.6 10*3/uL (ref 1.7–7.7)
Neutrophils Relative %: 78 %
Platelets: 81 10*3/uL — ABNORMAL LOW (ref 150–400)
RBC: 2.34 MIL/uL — ABNORMAL LOW (ref 4.22–5.81)
RDW: 17.5 % — ABNORMAL HIGH (ref 11.5–15.5)
WBC: 3.3 10*3/uL — ABNORMAL LOW (ref 4.0–10.5)
nRBC: 0 % (ref 0.0–0.2)

## 2020-09-24 LAB — IRON AND TIBC
Iron: 39 ug/dL — ABNORMAL LOW (ref 45–182)
Saturation Ratios: 14 % — ABNORMAL LOW (ref 17.9–39.5)
TIBC: 280 ug/dL (ref 250–450)
UIBC: 241 ug/dL

## 2020-09-24 LAB — RETICULOCYTES
Immature Retic Fract: 30.9 % — ABNORMAL HIGH (ref 2.3–15.9)
RBC.: 2.36 MIL/uL — ABNORMAL LOW (ref 4.22–5.81)
Retic Count, Absolute: 72.9 10*3/uL (ref 19.0–186.0)
Retic Ct Pct: 3.1 % (ref 0.4–3.1)

## 2020-09-24 LAB — PREPARE RBC (CROSSMATCH)

## 2020-09-24 LAB — FERRITIN: Ferritin: 167 ng/mL (ref 24–336)

## 2020-09-24 NOTE — Progress Notes (Signed)
Jeffrey [name], [place] [address]   CLINIC:  Medical Oncology/Hematology  PCP:  Monico Blitz, MD [address] [place] [address]  [phone]  REASON FOR VISIT:  Follow-up for IDA  PRIOR THERAPY: Intermittent Feraheme  CURRENT THERAPY: Intermittent Venofer last on 07/09/2020  INTERVAL HISTORY:  Jeffrey [name], a 75 y.o. male, returns for routine follow-up for his IDA. Jeffrey [name] was last seen on 08/08/2020.  Today he is accompanied by his wife and he reports feeling fair. He reports feeling weak today. He had a paracentesis on 04/14 and had 3.5 liters of yellow fluid drained. He continues having bleeding from his hemorrhoids; his last episode of melena was about 1 month ago. He does not remember if he had melena when he had his FOBT positive card. 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 He has never received PRBC at HD.   REVIEW OF SYSTEMS:  Review of Systems  Constitutional: Positive for appetite change (75%) and fatigue (25%).  Respiratory: Positive for shortness of breath (w/ exertion).   Cardiovascular: Positive for leg swelling.  Musculoskeletal: Positive for back pain (8/10 dull pain between scapulae).  Skin: Positive for itching (R lower abdomen).  Neurological: Positive for numbness (feet).  Hematological: Bruises/bleeds easily (easy bruising).  All other systems reviewed and are negative.   PAST MEDICAL/SURGICAL HISTORY:  Past Medical History:  Diagnosis Date  . A-fib [facility])    when initially stating dialysis january 2021 at [facility] per spouse; never heard anything else about it  . Alcoholic cirrhosis ([place])    patient reports completing hep a and b vaccines in 2006  . Anemia    has had 3 units of prbcs 2013  . Anxiety   . Arthritis   . Asymptomatic gallstones    Ultrasound in 2006  . C. difficile colitis   . Cholelithiasis   . Chronic kidney disease    Dialysis M/W/F/Sa  .  Depression   . Diabetes ([place])   . Duodenal ulcer with hemorrhage    per patient in 2006 or 2007, records have been requested  . Dyspnea    low oxygen level -  has O2 2 L all day  . 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 GERD (gastroesophageal reflux disease)   . Heart burn   . 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 HOH (hard of hearing)   . Hypertension   . Liver cirrhosis ([place])   . SBP (spontaneous bacterial peritonitis) ([place])    2020, November 2021.   [name] Splenomegaly    Ultrasound in 2006   Past Surgical History:  Procedure Laterality Date  . AGILE CAPSULE N/A 09/20/2019   Procedure: AGILE CAPSULE;  Surgeon: Daneil Dolin, MD;  [facility];  Service: Endoscopy;  Laterality: N/A;  . APPENDECTOMY    . AV FISTULA PLACEMENT Left 08/17/2019   Procedure: ARTERIOVENOUS (AV) FISTULA CREATION VERSES ARTERIOVENOUS GRAFT;  Surgeon: [name], MD;  Location: [place];  Service: Vascular;  Laterality: Left;  . BASCILIC VEIN TRANSPOSITION Left 09/28/2019   Procedure: LEFT SECOND STAGE BASCILIC VEIN TRANSPOSITION;  Surgeon: [name], MD;  Location: [place];  Service: Vascular;  Laterality: Left;  . BIOPSY  02/03/2018   Procedure: BIOPSY;  Surgeon: Daneil Dolin, MD;  [facility];  Service: Endoscopy;;  bx of gastric polyps  . BIOPSY  09/19/2019   Procedure: BIOPSY;  Surgeon: Danie Binder, MD;  [facility];  Service: Endoscopy;;  . CATARACT EXTRACTION Right   . COLONOSCOPY  03/10/2005   Rectal polyp as described above, removed with snare. Left sided  diverticula. The remainder of the colonic mucosa appeared normal. Inflammed polyp on path.  . COLONOSCOPY  04/1999   Dr. Thea Silversmith polyps removed,  Path showed hyperplastic  . COLONOSCOPY  10/2015   Dr. Britta Mccreedy: diverticulosis, single sessile tubular adenoma 3-33m in size removed from descending colon.   . COLONOSCOPY WITH ESOPHAGOGASTRODUODENOSCOPY (EGD)  03/31/2012   [id number]. Colonic  diverticulosis. tubular adenoma colon  . COLONOSCOPY WITH ESOPHAGOGASTRODUODENOSCOPY (EGD)  06/2019   FORSYTH: small esophageal varices without high risk stigmata, single large AVM on lesser curvature in gastric body s/p APC ablation, gastric antral and duodenal bulb polyposis. No significant source to explain transfusion dependent anemia. Colonoscopy with portal colopathy and diffuse edema, changes of Cdiff colitis on colonoscopy  . ESOPHAGEAL BANDING N/A 02/03/2018   Procedure: ESOPHAGEAL BANDING;  Surgeon: RDaneil Dolin MD;  [facility];  Service: Endoscopy;  Laterality: N/A;  . ESOPHAGOGASTRODUODENOSCOPY  01/15/2005   Three columns grade 1 to 2 esophageal varices, otherwise normal esophageal mucosa.  Esophagus was not manipulated otherwise./Nodularity of the antrum with overlying erosions, nonspecific finding. Path showed rare H.pylori  . ESOPHAGOGASTRODUODENOSCOPY  09/2008   Dr. RGaylene Brookscolumns of grade 2-3 esoph varices, only one column was prominent. Portal gastropathy, multiple gastrc polyps at antrum, two were 2cm with black eschar, bulbar polyps, bulbar erosions  . ESOPHAGOGASTRODUODENOSCOPY  11/2004   Dr. FBrantley Stage3 esoph varices  . ESOPHAGOGASTRODUODENOSCOPY  03/31/2012   RMR: 4 columns(3-GR 2, 1-GR1) non-bleeding esophageal varices, portal gastropathy, small HH, early GAVE, multiple gastric polyps   . ESOPHAGOGASTRODUODENOSCOPY (EGD) WITH PROPOFOL N/A 02/03/2018   Dr. RGala Romney Esophageal varices, 3 columns grade 1-2.  Portal hypertensive gastropathy.  Multiple gastric polyps, biopsy consistent with hyperplastic.  .Marland KitchenESOPHAGOGASTRODUODENOSCOPY (EGD) WITH PROPOFOL N/A 09/19/2019   Fields: grade I and II esophageal varcies, mild portal hypertensive gastropathy, moderate gastritis but no H. pylori, single hyperplastic gastric polyp removed, obvious source for melena/transfusion dependent anemia not identified, may be due to friable gastric and duodenal mucosa in the  setting of portal hypertension  . GASTRIC VARICES BANDING  03/31/2012   Procedure: GASTRIC VARICES BANDING;  Surgeon: RDaneil Dolin MD;  [facility];  Service: Endoscopy;  Laterality: N/A;  . GIVENS CAPSULE STUDY N/A 09/21/2019   Poor study with a lot of debris obstructing much of the view of the first approximate 3 hours out of 6 hours.  No obvious source of bleeding identified.  Sequela of bleeding and old blood seen in the form of"whisps" of blood, blood flecks mostly toward the end of the study.  . IR REMOVAL TUN CV CATH W/O FL  02/15/2020  . POLYPECTOMY  09/19/2019   Procedure: POLYPECTOMY;  Surgeon: FDanie Binder MD;  [facility];  Service: Endoscopy;;  . UMBILICAL HERNIA REPAIR  2017   exploratory laparotomy, abdominal washout    SOCIAL HISTORY:  Social History   Socioeconomic History  . Marital status: Married    Spouse name: Not on file  . Number of children: 2  . Years of education: Not on file  . Highest education level: Not on file  Occupational History  . Occupation: RETIRED    Employer: SELF EMPLOYED  Tobacco Use  . Smoking status: Former Smoker  Packs/day: 2.00    Years: 25.00    Pack years: 50.00    Types: Cigarettes    Quit date: 01/28/1986    Years since quitting: 34.6  . Smokeless tobacco: Never Used  Vaping Use  . Vaping Use: Never used  Substance and Sexual Activity  . Alcohol use: No    Comment: quit in 05/2013  . Drug use: No  . Sexual activity: Not Currently  Other Topics Concern  . Not on file  Social History Narrative   Two step children from second marriage (divorced) who live with him along with some grandchildren.    Social Determinants of Health   Financial Resource Strain: Not on file  Food Insecurity: No Food Insecurity  . Worried About Charity fundraiser in the Last Year: Never true  . Ran Out of Food in the Last Year: Never true  Transportation Needs: No Transportation Needs  . Lack of Transportation  (Medical): No  . Lack of Transportation (Non-Medical): No  Physical Activity: Inactive  . Days of Exercise per Week: 0 days  . Minutes of Exercise per Session: 0 min  Stress: Not on file  Social Connections: Not on file  Intimate Partner Violence: Not At Risk  . Fear of Current or Ex-Partner: No  . Emotionally Abused: No  . Physically Abused: No  . Sexually Abused: No    FAMILY HISTORY:  Family History  Problem Relation Age of Onset  . Colon cancer Father 72       deceased age 29  . Breast cancer Sister        deceased  . Diabetes Sister   . Stroke Mother   . Healthy Son   . Healthy Daughter   . Lung cancer Neg Hx   . Ovarian cancer Neg Hx     CURRENT MEDICATIONS:  Current Outpatient Medications  Medication Sig Dispense Refill  . acetaminophen (TYLENOL) 500 MG tablet Take 1,000 mg by mouth as needed for mild pain.    . Alcohol Swabs (ALCOHOL PREP) 70 % PADS Apply 1 each topically 3 (three) times daily.    [name] ALPRAZolam (XANAX) 0.5 MG tablet Take 0.5 mg by mouth daily as needed.    . Ascorbic Acid (VITAMIN C) 1000 MG tablet Take 1,000 mg by mouth 2 (two) times daily.    . calcium carbonate (TUMS - DOSED IN MG ELEMENTAL CALCIUM) 500 MG chewable tablet Chew 1 tablet by mouth 3 (three) times daily with meals.    . Calcium Carbonate-Simethicone (ALKA-SELTZER HEARTBURN + GAS) 750-80 MG CHEW Chew by mouth as needed.    . diltiazem (TIAZAC) 120 MG 24 hr capsule Take 120 mg by mouth daily.    . furosemide (LASIX) 40 MG tablet Take 40 mg by mouth 2 (two) times daily.    . furosemide (LASIX) 80 MG tablet Take by mouth.    . gabapentin (NEURONTIN) 100 MG capsule TAKE 1 CAPSULE BY MOUTH AT BEDTIME 90 capsule 0  . hydrocortisone (ANUSOL-HC) 2.5 % rectal cream Place 1 application rectally 2 (two) times daily. (Patient taking differently: Place 1 application rectally 2 (two) times daily as needed for hemorrhoids or anal itching.) 30 g 1  . hydrOXYzine (VISTARIL) 25 MG capsule Take 25 mg by  mouth 3 (three) times daily as needed for itching.     . Insulin Degludec (TRESIBA FLEXTOUCH [place]) Inject into the skin.    [name] insulin degludec (TRESIBA FLEXTOUCH) 200 UNIT/ML FlexTouch Pen Inject 120 Units into the skin  at bedtime. When blood sugars are up (Patient taking differently: Inject 120 Units into the skin at bedtime.)    . iron polysaccharides (NIFEREX) 150 MG capsule Take 150 mg by mouth at bedtime.     [name] lactulose (CONSTULOSE) 10 GM/15ML solution TAKE 45 MLS EVERY MORNING AND TAKE 45 MLS EVERY EVENING (Patient taking differently: TAKE 45 MLS EVERY MORNING AND TAKE 30-45 MLS EVERY EVENING) 2700 mL 12  . Lancet Devices (ADJUSTABLE LANCING DEVICE) MISC Apply 1 each topically daily.    [name] levofloxacin (LEVAQUIN) 500 MG tablet Take 500 mg 3 times a week after dialysis. 12 tablet 11  . metoprolol succinate (TOPROL-XL) 25 MG 24 hr tablet Take 1 tablet (25 mg total) by mouth daily. Take at bedtime  on Monday ,Wednesday and Friday due to dialaylisis (Patient taking differently: Take 25 mg by mouth See admin instructions. Take 25 mg by mouth at bedtime every night and 25 mg in morning on  Sun, Tues, Thurs and Sat)    . midodrine (PROAMATINE) 10 MG tablet Take 1 tablet (10 mg total) by mouth 2 (two) times daily with a meal. 60 tablet 1  . milk thistle 175 MG tablet Take 175 mg by mouth 3 (three) times daily.     . mirtazapine (REMERON) 30 MG tablet Take 1 tablet (30 mg total) by mouth at bedtime. 30 tablet 3  . Multiple Vitamin (MULTIVITAMIN WITH MINERALS) TABS tablet Take 1 tablet by mouth in the morning.     [name] NOVOFINE 32G X 6 MM MISC Inject into the skin.    [name] nystatin (MYCOSTATIN/NYSTOP) powder Apply topically 3 (three) times daily. Into groin area and affected area (Patient taking differently: Apply topically as needed. Into groin area and affected area) 45 g 0  . Omega-3 1000 MG CAPS  ( 200 mg ), bid, # 120    . omega-3 acid ethyl esters (LOVAZA) 1 g capsule Take 2 g by mouth 2 (two) times daily.      [name] omeprazole (PRILOSEC) 40 MG capsule Take 40 mg by mouth daily.    . ondansetron (ZOFRAN) 4 MG tablet Take 4 mg by mouth as needed for nausea or vomiting.    [name] oxyCODONE-acetaminophen (PERCOCET/ROXICET) 5-325 MG tablet Take 1 tablet by mouth every 4 (four) hours as needed for moderate pain.    . Ped Vitamins ACD Fl-Iron (TRI-VIT/FLUORIDE/IRON PO)  daily    . pravastatin (PRAVACHOL) 20 MG tablet Take 20 mg by mouth in the morning.     . Probiotic Product (PROBIOTIC DAILY PO) Take 1 tablet by mouth in the morning.     [name] spironolactone (ALDACTONE) 50 MG tablet Take 50 mg by mouth in the morning.     . tamsulosin (FLOMAX) 0.4 MG CAPS capsule Take 0.4 mg by mouth every evening.    . thiamine (VITAMIN B-1) 100 MG tablet Take 1 tablet by mouth daily.    [name] tiZANidine (ZANAFLEX) 2 MG tablet Take 2 mg by mouth 2 (two) times daily as needed for muscle spasms.     [name] triamcinolone cream (KENALOG) 0.1 % Apply 1 application topically 2 (two) times daily as needed (dry skin Itching).   3  . venlafaxine (EFFEXOR) 75 MG tablet Take 75 mg by mouth 3 (three) times daily with meals.     [name] XIFAXAN 550 MG TABS tablet TAKE 1 TABLET BY MOUTH TWICE DAILY (Patient taking differently: Take 550 mg by mouth 2 (two) times daily.) 60 tablet 11   No current  facility-administered medications for this visit.    ALLERGIES:  Allergies  Allergen Reactions  . Tape Itching and Rash    PHYSICAL EXAM:  Performance status (ECOG): 1 - Symptomatic but completely ambulatory  Vitals:   09/24/20 1054  BP: (!) 139/59  Pulse: 84  Resp: 20  Temp: (!) 96.9 F (36.1 C)  SpO2: 100%   Wt Readings from Last 3 Encounters:  09/24/20 236 lb (107 kg)  08/08/20 221 lb 9.6 oz (100.5 kg)  07/30/20 216 lb (98 kg)   Physical Exam Vitals reviewed.  Constitutional:      Appearance: Normal appearance. He is obese.     Interventions: Nasal cannula in place.     Comments: In wheelchair  Cardiovascular:     Rate and Rhythm: Normal  rate and regular rhythm.     Pulses: Normal pulses.     Heart sounds: Normal heart sounds.  Pulmonary:     Effort: Pulmonary effort is normal.     Breath sounds: Normal breath sounds.  Musculoskeletal:     Right lower leg: Edema (1+) present.     Left lower leg: Edema (1+) present.  Neurological:     General: No focal deficit present.     Mental Status: He is alert and oriented to person, place, and time.  Psychiatric:        Mood and Affect: Mood normal.        Behavior: Behavior normal.     LABORATORY DATA:  I have reviewed the labs as listed.  CBC Latest Ref Rng & Units 09/19/2020 08/01/2020 08/01/2020  WBC 4.0 - 10.5 K/uL 2.7(L) DUPLICATE 2.8(L)  Hemoglobin 13.0 - 17.0 g/dL 7.0(L) DUPLICATE 7.6(L)  Hematocrit 39.0 - 52.0 % 24.2(L) DUPLICATE 24.9(L)  Platelets 150 - 607 K/uL 37(T) DUPLICATE 06(Y)   CMP Latest Ref Rng & Units 09/19/2020 08/01/2020 05/30/2020  Glucose 70 - 99 mg/dL - 214(H) 130(H)  BUN 8 - 23 mg/dL - 19 16  Creatinine 0.61 - 1.24 mg/dL - 2.73(H) 2.50(H)  Sodium 135 - 145 mmol/L - 129(L) 130(L)  Potassium 3.5 - 5.1 mmol/L - 3.8 4.1  Chloride 98 - 111 mmol/L - 93(L) 93(L)  CO2 22 - 32 mmol/L - 26 28  Calcium 8.6 - 10.2 mg/dL 8.3(L) 8.3(L) 8.2(L)  Total Protein 6.5 - 8.1 g/dL - 6.2(L) 6.2(L)  Total Bilirubin 0.3 - 1.2 mg/dL - 0.8 0.7  Alkaline Phos 38 - 126 U/L - 191(H) 140(H)  AST 15 - 41 U/L - 20 18  ALT 0 - 44 U/L - 15 15      Component Value Date/Time   RBC 2.25 (L) 09/19/2020 1024   RBC 2.25 (L) 09/19/2020 1024   MCV 107.6 (H) 09/19/2020 1024   MCV 82.0 02/25/2012 1034   MCH 31.1 09/19/2020 1024   MCHC 28.9 (L) 09/19/2020 1024   RDW 17.7 (H) 09/19/2020 1024   LYMPHSABS 0.3 (L) 09/19/2020 1024   MONOABS 0.2 09/19/2020 1024   EOSABS 0.1 09/19/2020 1024   BASOSABS 0.0 09/19/2020 1024   Lab Results  Component Value Date   LDH 118 09/19/2020   LDH 103 08/01/2020   LDH 108 05/30/2020   Lab Results  Component Value Date   TIBC 256 09/19/2020   TIBC  137 (L) 04/03/2020   TIBC 205 (L) 03/28/2020   FERRITIN 181 09/19/2020   FERRITIN 333 08/01/2020   FERRITIN 308 05/30/2020   IRONPCTSAT 21 09/19/2020   IRONPCTSAT 15 (L) 04/03/2020   IRONPCTSAT 13 (L) 03/28/2020  DIAGNOSTIC IMAGING:  I have independently reviewed the scans and discussed with the patient. US Paracentesis  Result Date: 09/12/2020 INDICATION: Recurrent ascites EXAM: ULTRASOUND GUIDED LLQ PARACENTESIS MEDICATIONS: 10 cc 1% lidocaine. COMPLICATIONS: None immediate. PROCEDURE: Informed written consent was obtained from the patient after a discussion of the risks, benefits and alternatives to treatment. A timeout was performed prior to the initiation of the procedure. Initial ultrasound scanning demonstrates a large amount of ascites within the left lower abdominal quadrant. The left lower abdomen was prepped and draped in the usual sterile fashion. 1% lidocaine was used for local anesthesia. Following this, a Yueh catheter was introduced. An ultrasound image was saved for documentation purposes. The paracentesis was performed. The catheter was removed and a dressing was applied. The patient tolerated the procedure well without immediate post procedural complication. Patient received post-procedure intravenous albumin; see nursing notes for details. FINDINGS: A total of approximately 3.5 liters of yellow fluid was removed. Samples were sent to the laboratory as requested by the clinical team. IMPRESSION: Successful ultrasound-guided paracentesis yielding 3.5 liters of peritoneal fluid. Read by Lavonia Drafts [facility] Electronically Signed   By: Lavonia Dana M.D.   On: 09/12/2020 10:57     ASSESSMENT:  1. 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 -CTAP on 01/10/2016 showed splenomegaly. -Work-up included SPEP and immunofixation is negative. -Last Venofer from 06/25/2020 with through 07/09/2020, 800 mg. -He had multiple EGDs done in the past for  varices.  2.End-stage renal disease: -He gets dialysis Monday Wednesday Friday -He receives Epogen injections at his dialysis.   PLAN:  1. Normocytic anemia: -Anemia likely from CKD, iron deficiency and blood loss. - Stool cards x3 were positive for occult blood.  Hemolysis labs were negative with normal LDH and reticulocyte count. - We will hold off on proceeding with bone marrow biopsy at this time because of active bleeding. - Reviewed labs from from 09/19/2020 which showed hemoglobin 7.0.  Will give him 1 unit of PRBC. - We will check CBC in 4 weeks for transfusion. - RTC 2 months for follow-up with repeat ferritin, iron panel and CBC.  2. Vitamin B12 deficiency: -Continue B12 supplements.  B12 is normal.  3.  ESRD on HD: -Continue dialysis on Monday Wednesday and Friday.  Continue follow-up with Dr. Theador Hawthorne.  4.  End-stage liver disease with splenomegaly: -Paracentesis on 09/12/2020 with 3.5 L removed. - Leukopenia and thrombocytopenia from splenomegaly.  Orders placed this encounter:  No orders of the defined types were placed in this encounter.    Derek Jack, MD [place] [phone]   I, Milinda Antis, am acting as a scribe for Dr. Sanda Linger.  I, Derek Jack MD, have reviewed the above documentation for accuracy and completeness, and I agree with the above.

## 2020-09-24 NOTE — Telephone Encounter (Signed)
mychart message received. Pt would like to have a paracentesis due to enlarged abdomen due to fluid. Pt is going to have blood completed at [facility] Thursday and isn't sure if it can be done then. Pt also has dialysis Mon, Wed and Fridays. Please advise.

## 2020-09-24 NOTE — Patient Instructions (Signed)
[place] at [facility] Discharge Instructions  You were seen today by Dr. Delton Coombes. He went over your recent results. You will be scheduled to have a blood transfusion this week; you will then have labs checked in 1 month for a possible transfusion. Your next appointment will be with the physician assistant in 2 months for labs and follow up.   Thank you for choosing [place] at [facility] to provide your oncology and hematology care.  To afford each patient quality time with our provider, please arrive at least 15 minutes before your scheduled appointment time.   If you have a lab appointment with the [place] please come in thru the Main Entrance and check in at the main information desk  You need to re-schedule your appointment should you arrive 10 or more minutes late.  We strive to give you quality time with our providers, and arriving late affects you and other patients whose appointments are after yours.  Also, if you no show three or more times for appointments you may be dismissed from the clinic at the providers discretion.     Again, thank you for choosing [facility].  Our hope is that these requests will decrease the amount of time that you wait before being seen by our physicians.       _____________________________________________________________  Should you have questions after your visit to [facility], please contact our office at [phone] between the hours of 8:00 a.m. and 4:30 p.m.  Voicemails left after 4:00 p.m. will not be returned until the following business day.  For prescription refill requests, have your pharmacy contact our office and allow 72 hours.    Cancer Center Support Programs:   > Cancer Support Group  2nd Tuesday of the month 1pm-2pm, Journey Room

## 2020-09-25 ENCOUNTER — Other Ambulatory Visit: Payer: Self-pay

## 2020-09-25 ENCOUNTER — Encounter (HOSPITAL_COMMUNITY): Payer: Medicare Other

## 2020-09-25 DIAGNOSIS — R188 Other ascites: Secondary | ICD-10-CM

## 2020-09-25 NOTE — Telephone Encounter (Signed)
Okay on paracentesis.  Probably just need to get him a standing appointment every 2 weeks.

## 2020-09-25 NOTE — Telephone Encounter (Signed)
Para scheduled for tomorrow, arrive at 10:45am. Para standing order faxed to central scheduling. Orders placed for paras.  Called and informed wife of para appt for tomorrow. Informed her of standing order. She wasn't able to write down phone# for central scheduling. MyChart message sent to give her phone#.

## 2020-09-25 NOTE — Telephone Encounter (Signed)
Noting. Routing to [facility], please arrange paracentesis.

## 2020-09-26 ENCOUNTER — Ambulatory Visit (HOSPITAL_COMMUNITY)
Admission: RE | Admit: 2020-09-26 | Discharge: 2020-09-26 | Disposition: A | Discharge status: Home / Self Care | Payer: Medicare Other | Source: Ambulatory Visit | Attending: Internal Medicine | Admitting: Internal Medicine

## 2020-09-26 ENCOUNTER — Inpatient Hospital Stay (HOSPITAL_COMMUNITY): Payer: Medicare Other

## 2020-09-26 ENCOUNTER — Encounter (HOSPITAL_COMMUNITY): Payer: Self-pay

## 2020-09-26 ENCOUNTER — Other Ambulatory Visit: Payer: Self-pay

## 2020-09-26 ENCOUNTER — Other Ambulatory Visit (HOSPITAL_COMMUNITY): Payer: Self-pay | Admitting: Physician Assistant

## 2020-09-26 DIAGNOSIS — D500 Iron deficiency anemia secondary to blood loss (chronic): Secondary | ICD-10-CM

## 2020-09-26 DIAGNOSIS — R188 Other ascites: Secondary | ICD-10-CM | POA: Diagnosis present

## 2020-09-26 DIAGNOSIS — D649 Anemia, unspecified: Secondary | ICD-10-CM

## 2020-09-26 DIAGNOSIS — N186 End stage renal disease: Secondary | ICD-10-CM | POA: Diagnosis not present

## 2020-09-26 LAB — GRAM STAIN: Gram Stain: NONE SEEN

## 2020-09-26 LAB — BODY FLUID CELL COUNT WITH DIFFERENTIAL
Eos, Fluid: 0 %
Lymphs, Fluid: 50 %
Monocyte-Macrophage-Serous Fluid: 47 % — ABNORMAL LOW (ref 50–90)
Neutrophil Count, Fluid: 3 % (ref 0–25)
Total Nucleated Cell Count, Fluid: 131 cu mm (ref 0–1000)

## 2020-09-26 LAB — METHYLMALONIC ACID, SERUM: Methylmalonic Acid, Quantitative: 662 nmol/L — ABNORMAL HIGH (ref 0–378)

## 2020-09-26 MED ORDER — SODIUM CHLORIDE 0.9% IV SOLUTION
250.0000 mL | Freq: Once | INTRAVENOUS | Status: AC
  Administered 2020-09-26: 250 mL via INTRAVENOUS

## 2020-09-26 MED ORDER — ACETAMINOPHEN 325 MG PO TABS
650.0000 mg | ORAL_TABLET | Freq: Once | ORAL | Status: AC
  Administered 2020-09-26: 650 mg via ORAL

## 2020-09-26 MED ORDER — ACETAMINOPHEN 325 MG PO TABS
ORAL_TABLET | ORAL | Status: AC
  Filled 2020-09-26: qty 2

## 2020-09-26 MED ORDER — DIPHENHYDRAMINE HCL 25 MG PO CAPS
25.0000 mg | ORAL_CAPSULE | Freq: Once | ORAL | Status: AC
  Administered 2020-09-26: 25 mg via ORAL

## 2020-09-26 MED ORDER — DIPHENHYDRAMINE HCL 25 MG PO CAPS
ORAL_CAPSULE | ORAL | Status: AC
  Filled 2020-09-26: qty 1

## 2020-09-26 MED ORDER — ALBUMIN HUMAN 25 % IV SOLN
0.0000 g | Freq: Once | INTRAVENOUS | Status: DC
Start: 2020-09-26 — End: 2020-09-27

## 2020-09-26 NOTE — Patient Instructions (Signed)
[facility]  Discharge Instructions: Thank you for choosing [facility] to provide your oncology and hematology care.  If you have a lab appointment with the [facility], please come in thru the Main Entrance and check in at the main information desk.  Wear comfortable clothing and clothing appropriate for easy access to any Portacath or PICC line.   We strive to give you quality time with your provider. You may need to reschedule your appointment if you arrive late (15 or more minutes).  Arriving late affects you and other patients whose appointments are after yours.  Also, if you miss three or more appointments without notifying the office, you may be dismissed from the clinic at the provider's discretion.      For prescription refill requests, have your pharmacy contact our office and allow 72 hours for refills to be completed.    Today you received the 1 Unit of PRBC's.    BELOW ARE SYMPTOMS THAT SHOULD BE REPORTED IMMEDIATELY: . *FEVER GREATER THAN 100.4 F (38 C) OR HIGHER . *CHILLS OR SWEATING . *NAUSEA AND VOMITING THAT IS NOT CONTROLLED WITH YOUR NAUSEA MEDICATION . *UNUSUAL SHORTNESS OF BREATH . *UNUSUAL BRUISING OR BLEEDING . *URINARY PROBLEMS (pain or burning when urinating, or frequent urination) . *BOWEL PROBLEMS (unusual diarrhea, constipation, pain near the anus) . TENDERNESS IN MOUTH AND THROAT WITH OR WITHOUT PRESENCE OF ULCERS (sore throat, sores in mouth, or a toothache) . UNUSUAL RASH, SWELLING OR PAIN  . UNUSUAL VAGINAL DISCHARGE OR ITCHING   Items with * indicate a potential emergency and should be followed up as soon as possible or go to the Emergency Department if any problems should occur.  Should you have questions after your visit or need to cancel or reschedule your appointment, please contact [facility] [phone]  and follow the prompts.  Office hours are 8:00 a.m. to 4:30 p.m. Monday - Friday. Please note that  voicemails left after 4:00 p.m. may not be returned until the following business day.  We are closed weekends and major holidays. You have access to a nurse at all times for urgent questions. Please call the main number to the clinic [phone] and follow the prompts.  For any non-urgent questions, you may also contact your provider using MyChart. We now offer e-Visits for anyone 42 and older to request care online for non-urgent symptoms. For details visit [email or web address].   Also download the MyChart app! Go to the app store, search "MyChart", open the app, select [facility], and log in with your MyChart username and password.  Due to Covid, a mask is required upon entering the hospital/clinic. If you do not have a mask, one will be given to you upon arrival. For doctor visits, patients may have 1 support person aged 36 or older with them. For treatment visits, patients cannot have anyone with them due to current Covid guidelines and our immunocompromised population.

## 2020-09-26 NOTE — Discharge Instructions (Signed)
Paracentesis, Care After This sheet gives you information about how to care for yourself after your procedure. Your health care provider may also give you more specific instructions. If you have problems or questions, contact your health care provider. What can I expect after the procedure? After the procedure, it is common to have a small amount of clear fluid coming from the puncture site. Follow these instructions at home: Puncture site care  Follow instructions from your health care provider about how to take care of your puncture site. Make sure you: ? Wash your hands with soap and water before and after you change your bandage (dressing). If soap and water are not available, use hand sanitizer. ? Change your dressing as told by your health care provider.  Check your puncture area every day for signs of infection. Check for: ? Redness, swelling, or pain. ? More fluid or blood. ? Warmth. ? Pus or a bad smell.   General instructions  Return to your normal activities as told by your health care provider. Ask your health care provider what activities are safe for you.  Take over-the-counter and prescription medicines only as told by your health care provider.  Do not take baths, swim, or use a hot tub until your health care provider approves. Ask your health care provider if you may take showers. You may only be allowed to take sponge baths.  Keep all follow-up visits as told by your health care provider. This is important. Contact a health care provider if:  You have redness, swelling, or pain at your puncture site.  You have more fluid or blood coming from your puncture site.  Your puncture site feels warm to the touch.  You have pus or a bad smell coming from your puncture site.  You have a fever. Get help right away if:  You have chest pain or shortness of breath.  You develop increasing pain, discomfort, or swelling in your abdomen.  You feel dizzy or light-headed or  you faint. Summary  After the procedure, it is common to have a small amount of clear fluid coming from the puncture site.  Follow instructions from your health care provider about how to take care of your puncture site.  Check your puncture area every day signs of infection.  Keep all follow-up visits as told by your health care provider. This information is not intended to replace advice given to you by your health care provider. Make sure you discuss any questions you have with your health care provider. Document Revised: 11/29/2018 Document Reviewed: 03/08/2018 Elsevier Patient Education  2021 Elsevier Inc.  

## 2020-09-26 NOTE — Progress Notes (Signed)
Patient presents today for 1 Unit of PRBC's. MAR reviewed and updated. Patient has no complaints of any pain today. Patient denies any changes since his last visit.   1 Unit of PRBC's given today per MD orders. Tolerated infusion without adverse affects. Vital signs stable. No complaints at this time. Discharged from clinic via wheel chair in stable condition. Alert and oriented x 3. F/U with [facility] as scheduled.

## 2020-09-26 NOTE — Sedation Documentation (Signed)
Patient tolerated left sided Paracentesis procedure well today and 4.7 Liters of cloudy yellow fluid removed with labs collected and sent for processing. Patient vital signs remained stable during and after procedure. PT verbalized understanding of discharge instructions and pushed back to waiting area via wheelchair to family.

## 2020-09-26 NOTE — Procedures (Signed)
US guided LLQ paracentesis  4.7 L cloudy yellow fluid obtained Tolerated well Sent for labs per MD  EBL: None

## 2020-09-27 LAB — TYPE AND SCREEN
ABO/RH(D): O POS
Antibody Screen: NEGATIVE
Unit division: 0

## 2020-09-27 LAB — BPAM RBC
Blood Product Expiration Date: 202205242359
ISSUE DATE / TIME: 202204280920
Unit Type and Rh: 5100

## 2020-09-30 LAB — PATHOLOGIST SMEAR REVIEW

## 2020-10-01 ENCOUNTER — Ambulatory Visit (INDEPENDENT_AMBULATORY_CARE_PROVIDER_SITE_OTHER): Payer: Medicare Other

## 2020-10-01 ENCOUNTER — Other Ambulatory Visit: Payer: Self-pay

## 2020-10-01 DIAGNOSIS — R339 Retention of urine, unspecified: Secondary | ICD-10-CM | POA: Diagnosis not present

## 2020-10-01 LAB — CULTURE, BODY FLUID W GRAM STAIN -BOTTLE: Culture: NO GROWTH

## 2020-10-01 NOTE — Progress Notes (Signed)
Cath Change/ Replacement  Patient is present today for a catheter change due to urinary retention.  2ml of water was removed from the balloon, a 16FR foley cath was removed with out difficulty.  Patient was cleaned and prepped in a sterile fashion with betadine. A 16 FR foley cath was replaced into the bladder no complications were noted Urine return was noted 25ml and urine was yellow in color. The balloon was filled with 5ml of sterile water. A leg bag was attached for drainage.  A night bag was also given to the patient and patient was given instruction on how to change from one bag to another. Patient was given proper instruction on catheter care.    Performed by: Oliveah Zwack, Lpn  Follow up: Keep next scheduled OV

## 2020-10-01 NOTE — Patient Instructions (Signed)

## 2020-10-08 ENCOUNTER — Ambulatory Visit: Payer: Medicare Other

## 2020-10-21 ENCOUNTER — Other Ambulatory Visit: Payer: Self-pay | Admitting: *Deleted

## 2020-10-21 NOTE — Patient Outreach (Signed)
[facility]) Care Management  10/21/2020  VYRON FRONCZAK 03-07-1946 [id number]  Telephone Assessment-Successful-Diabetes  RN spoke with the pt's spouse [name]) who provider an update on pt's today. Reports pt continues to tolerate his week dialysis and attends all appointments with sufficient transportation. Reports recent primary visit last week and last A1C in April at 6.5. States AM readings are good with this morning's at 130. Currently having issues with coverage via insurance carrier on pt's Elenor Legato however [facility] visited yesterday and will assist pt with this task along with pt's provider. Verified pt has all his prescribed medications with no reported issues on medications.   Reviewed and discussed the currently plan of care and updated accordingly based upon pt's progress. Will continue to encourage adherence with all discussed and offered any needed assistance to overcome any barriers. Teach-method always uses to verified caregiver/pt's understanding. Will follow up next month on pt's ongoing progress concerning this plan of care. All questions and inquires addressed with no additional concerns at this time. Provider updated accordingly with quarterly status on pt's disposition with [facility].  Goals Addressed            This Visit's Progress   . [facility]-Disease Progression Prevented or Minimized   On track    Follow up date: 11/21/2020 Timeframe:  Short-Term Goal Priority:  Medium Start Date:     07/23/2020                        Expected End Date:  11/28/2020                      Evidence-based guidance:    Prepare patient for laboratory tests, such as lipid profile, microalbuminuria, estimated glomerular filtration rate and diagnostic exams based on risk and presentation.   Assess signs/symptoms and risk factors for hypertension, dyslipidemia, nephropathy, neuropathy and retinopathy.   Encourage lifestyle changes, such as increased intake of plant-based foods, stress  reduction, regular physical activity and smoking cessation to prevent long-term complications and chronic diseases.   Discourage sedentary behavior or sitting longer than 30 minutes without interruption; individualize exercise or activity recommendations with potential limitations, such as neuropathy, retinopathy or the ability to prevent  hyperglycemia or hypoglycemia.   Ensure completion of annual comprehensive foot exam at start of puberty or age 32 years, whichever is earlier, once the child has had diabetes for 5 years.     Implement additional individualized goals and interventions based on identified risk factors.   Prepare patient for consultation or referral for specialist care, such as ophthalmology, neurology, cardiology, podiatry, nephrology or perinatology. All that imply related to pt's medical history.   Barriers: Health Behaviors  Notes: 5/23-Verified pt continue weekly dialysis with no missed appointments (history) and has attended all medical appointments with his last PCP on 5/19 [name]. No major issues or acute needs at this time as pt and caregiver continue to manager the pt's care very well. Reports last A1C at 6.5 in April. Will continue praise all efforts in managing the pt's diabetes with the current management of care exhibited. No additional visits reported vi [facility] and pt continue to tolerate dialysis without acute issues. 4/19-Spouse reports pt continue HD 3 X weekly with no missed appointments. Pt continue to attend all medical appointments with sufficient transportation. No medication changes as pt remain adherent to taking all his medications. Pending appointments discussed with Oncologist on 4/21 and [name] for labs on  4/26. Pt had two falls several weeks ago with some bruises that are healing to his knees and toes. Verified pt did not have a head injury and did not require hospital or ED visit. Encouraged ongoing use of his DME RW for safety precaution.   3/22-Pt active with his ongoing HD 3 X weekly and attendance to all appointments.Visit to [facility] today for fluid retention (usually 1 liter removed). Pt goes every few weeks due to ongoing swelling to his LE.  Pt hx of anemia with Hgb 7 (being monitored). Spouse ([name]) feel pt may benefit from additional HHPT. Encouraged to make this request with [name] (receptive and she will inquire). Currently pt is using a RW unable to maintain stability with a cane at this time. Reports AM glucose at 140 and maintaining this average. Will continue to encouraged adherence and praised pt for his efforts in maintaining his health and adhering to all goals and interventions.  2/22-Will discuss all above and verified pt's has seen both his ophthalmologist and podiatry over the last year. Pt has been hospitalized with this years appointments and will rescheduled for his annuals. Stress the importance of completing this task. Verified no symptoms of hyper/hypoglycemia have occurred over the last month since the last conversation. Reports last Ac1 6.0 last week at the providers office and the last CBG read at 200. Aware this is elevated and RN case manager discuss ways to reduce this number. Pt has been lower in the past and has some stressors going on at this time.    . [facility]-Learn more abont my health   On track    Follow Up Date 11/21/2020 Timeframe:  Long-Range Goal Priority:  Medium Start Date:    06/20/2020                         Expected End Date:    12/27/2020                   - make a list of questions - ask questions - repeat what I heard to make sure I understand - bring a list of my medicines to the visit - speak up when I don't understand   Barriers: Health Behaviors  Why is this important?    The best way to learn about your health and care is by talking to the doctor and nurse.   They will answer your questions and give you information in the way that you like best.    Notes:   4/19- Will continue to discussed long term goals and interventions related to understanding of all his medications and all his medical conditions that require daily monitoring.                 1/20-[place] your diet, keep all appointments for diease related illness, follow all recommendations for treatment management and always utilize prevention measures to avoid acute encounters.  Will verbalize pt's understanding and verify with teach-back method.    [facility] and Keep All Appointments   On track    Follow Up Date : 11/21/2020 Timeframe:  Long-Range Goal Priority:  Medium Start Date:     05/23/2020                        Expected End Date:        12/27/2020                 -  call to cancel if needed - keep a calendar with prescription refill dates - keep a calendar with appointment dates   Barriers: Health Behaviors  Why is this important?   Part of staying healthy is seeing the doctor for follow-up care.  If you forget your appointments, there are some things you can do to stay on track.    Notes:  4/19 Will extend this goal to allow ongoing adherence as pt has missed some HD appointments over the last 3 months. Continue to stress the importance of pt attending all his medical appointments to avoid "no show" fees and acute events from happending related to missed HD appointments.  1/20-Spouse reports several appointments were rescheduled due to incremental weather but verified. Pt missed two dialysis appointment however was able to re-stabilize fluid removed via dialysis on another day. Will strongly encourage adherence with all medical appointments.  12/21-Verified pt continue to remain adherent with all dialysis and medical appointments scheduled. Will extend to allow ongoing adherence.       Raina Mina, RN Care Management Coordinator [place] Office [phone]

## 2020-10-22 ENCOUNTER — Ambulatory Visit (HOSPITAL_COMMUNITY)
Admission: RE | Admit: 2020-10-22 | Discharge: 2020-10-22 | Disposition: A | Discharge status: Home / Self Care | Payer: Medicare Other | Source: Ambulatory Visit | Attending: Internal Medicine | Admitting: Internal Medicine

## 2020-10-22 ENCOUNTER — Encounter (HOSPITAL_COMMUNITY): Payer: Self-pay

## 2020-10-22 DIAGNOSIS — R188 Other ascites: Secondary | ICD-10-CM | POA: Diagnosis not present

## 2020-10-22 LAB — BODY FLUID CELL COUNT WITH DIFFERENTIAL
Eos, Fluid: 2 %
Lymphs, Fluid: 73 %
Monocyte-Macrophage-Serous Fluid: 21 % — ABNORMAL LOW (ref 50–90)
Neutrophil Count, Fluid: 2 % (ref 0–25)
Other Cells, Fluid: 2 %
Total Nucleated Cell Count, Fluid: 162 cu mm (ref 0–1000)

## 2020-10-22 LAB — GRAM STAIN

## 2020-10-22 MED ORDER — ALBUMIN HUMAN 25 % IV SOLN
0.0000 g | Freq: Once | INTRAVENOUS | Status: DC
  Filled 2020-10-22: qty 400

## 2020-10-22 NOTE — Sedation Documentation (Signed)
PT tolerated left sided paracentesis procedure well today and 3 Liters of cloudy dark yellow fluid removed with labs collected and sent for processing. PT verbalized understanding of discharge instructions and vital sings remained stable at completion of procedure. PT transported via wheelchair back to waiting area at this time to wife.

## 2020-10-22 NOTE — Discharge Instructions (Signed)
Paracentesis, Care After This sheet gives you information about how to care for yourself after your procedure. Your health care provider may also give you more specific instructions. If you have problems or questions, contact your health care provider. What can I expect after the procedure? After the procedure, it is common to have a small amount of clear fluid coming from the puncture site. Follow these instructions at home: Puncture site care  Follow instructions from your health care provider about how to take care of your puncture site. Make sure you: ? Wash your hands with soap and water before and after you change your bandage (dressing). If soap and water are not available, use hand sanitizer. ? Change your dressing as told by your health care provider.  Check your puncture area every day for signs of infection. Check for: ? Redness, swelling, or pain. ? More fluid or blood. ? Warmth. ? Pus or a bad smell.   General instructions  Return to your normal activities as told by your health care provider. Ask your health care provider what activities are safe for you.  Take over-the-counter and prescription medicines only as told by your health care provider.  Do not take baths, swim, or use a hot tub until your health care provider approves. Ask your health care provider if you may take showers. You may only be allowed to take sponge baths.  Keep all follow-up visits as told by your health care provider. This is important. Contact a health care provider if:  You have redness, swelling, or pain at your puncture site.  You have more fluid or blood coming from your puncture site.  Your puncture site feels warm to the touch.  You have pus or a bad smell coming from your puncture site.  You have a fever. Get help right away if:  You have chest pain or shortness of breath.  You develop increasing pain, discomfort, or swelling in your abdomen.  You feel dizzy or light-headed or  you faint. Summary  After the procedure, it is common to have a small amount of clear fluid coming from the puncture site.  Follow instructions from your health care provider about how to take care of your puncture site.  Check your puncture area every day signs of infection.  Keep all follow-up visits as told by your health care provider. This information is not intended to replace advice given to you by your health care provider. Make sure you discuss any questions you have with your health care provider. Document Revised: 11/29/2018 Document Reviewed: 03/08/2018 Elsevier Patient Education  2021 Elsevier Inc.  

## 2020-10-22 NOTE — Procedures (Signed)
PreOperative Dx: Alcoholic cirrhosis, ascites, ESRD Postoperative Dx: Alcoholic cirrhosis, ascites, ESRD Procedure:   US guided paracentesis Radiologist:  Thornton Papas Anesthesia:  10 ml of1% lidocaine Specimen:  3 L of yellow ascitic fluid EBL:   < 1 ml Complications: None

## 2020-10-24 ENCOUNTER — Inpatient Hospital Stay (HOSPITAL_COMMUNITY): Payer: Medicare Other

## 2020-10-24 ENCOUNTER — Inpatient Hospital Stay (HOSPITAL_COMMUNITY): Payer: Medicare Other | Attending: Hematology

## 2020-10-24 ENCOUNTER — Other Ambulatory Visit: Payer: Self-pay

## 2020-10-24 VITALS — BP 123/46 | HR 72 | Temp 97.0°F | Resp 18

## 2020-10-24 DIAGNOSIS — D649 Anemia, unspecified: Secondary | ICD-10-CM

## 2020-10-24 DIAGNOSIS — K922 Gastrointestinal hemorrhage, unspecified: Secondary | ICD-10-CM | POA: Diagnosis present

## 2020-10-24 DIAGNOSIS — D500 Iron deficiency anemia secondary to blood loss (chronic): Secondary | ICD-10-CM | POA: Diagnosis not present

## 2020-10-24 DIAGNOSIS — D62 Acute posthemorrhagic anemia: Secondary | ICD-10-CM

## 2020-10-24 LAB — SAMPLE TO BLOOD BANK

## 2020-10-24 LAB — CBC
HCT: 25 % — ABNORMAL LOW (ref 39.0–52.0)
Hemoglobin: 7.7 g/dL — ABNORMAL LOW (ref 13.0–17.0)
MCH: 31.8 pg (ref 26.0–34.0)
MCHC: 30.8 g/dL (ref 30.0–36.0)
MCV: 103.3 fL — ABNORMAL HIGH (ref 80.0–100.0)
Platelets: 71 10*3/uL — ABNORMAL LOW (ref 150–400)
RBC: 2.42 MIL/uL — ABNORMAL LOW (ref 4.22–5.81)
RDW: 16.8 % — ABNORMAL HIGH (ref 11.5–15.5)
WBC: 2.6 10*3/uL — ABNORMAL LOW (ref 4.0–10.5)
nRBC: 0 % (ref 0.0–0.2)

## 2020-10-24 MED ORDER — ACETAMINOPHEN 325 MG PO TABS
ORAL_TABLET | ORAL | Status: AC
  Filled 2020-10-24: qty 2

## 2020-10-24 MED ORDER — DIPHENHYDRAMINE HCL 25 MG PO CAPS
ORAL_CAPSULE | ORAL | Status: AC
  Filled 2020-10-24: qty 1

## 2020-10-24 NOTE — Patient Instructions (Signed)
[facility]  Discharge Instructions: Thank you for choosing [facility] to provide your oncology and hematology care.  If you have a lab appointment with the [facility], please come in thru the Main Entrance and check in at the main information desk.  Wear comfortable clothing and clothing appropriate for easy access to any Portacath or PICC line.   We strive to give you quality time with your provider. You may need to reschedule your appointment if you arrive late (15 or more minutes).  Arriving late affects you and other patients whose appointments are after yours.  Also, if you miss three or more appointments without notifying the office, you may be dismissed from the clinic at the provider's discretion.      For prescription refill requests, have your pharmacy contact our office and allow 72 hours for refills to be completed.       To help prevent nausea and vomiting after your treatment, we encourage you to take your nausea medication as directed.   Items with * indicate a potential emergency and should be followed up as soon as possible or go to the Emergency Department if any problems should occur.    Should you have questions after your visit or need to cancel or reschedule your appointment, please contact [facility] [phone]  and follow the prompts.  Office hours are 8:00 a.m. to 4:30 p.m. Monday - Friday. Please note that voicemails left after 4:00 p.m. may not be returned until the following business day.  We are closed weekends and major holidays. You have access to a nurse at all times for urgent questions. Please call the main number to the clinic [phone] and follow the prompts.  For any non-urgent questions, you may also contact your provider using MyChart. We now offer e-Visits for anyone 81 and older to request care online for non-urgent symptoms. For details visit [email or web address].   Also download the MyChart app! Go  to the app store, search "MyChart", open the app, select [facility], and log in with your MyChart username and password.  Due to Covid, a mask is required upon entering the hospital/clinic. If you do not have a mask, one will be given to you upon arrival. For doctor visits, patients may have 1 support person aged 59 or older with them. For treatment visits, patients cannot have anyone with them due to current Covid guidelines and our immunocompromised population.

## 2020-10-24 NOTE — Progress Notes (Signed)
Hemoglobin 7.7 today, asymptomatic at this time. No blood needed today per MD.  Vitals stable and discharged home from clinic ambulatory. Follow up as scheduled.

## 2020-10-25 LAB — PATHOLOGIST SMEAR REVIEW

## 2020-10-28 LAB — CULTURE, BODY FLUID W GRAM STAIN -BOTTLE: Culture: NO GROWTH

## 2020-10-30 ENCOUNTER — Encounter: Payer: Self-pay | Admitting: Gastroenterology

## 2020-10-30 NOTE — Progress Notes (Signed)
Referring Provider: Monico Blitz, MD Primary Care Physician:  Monico Blitz, MD Primary GI Physician: Dr. Gala Romney  Chief Complaint  Patient presents with  . Chest Pain    Both sides  . Cirrhosis    HPI:   Jeffrey [name] is a 75 y.o. male presenting today for follow-up.  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  Also with history of GERD, ESRD on dialysis, transfusion dependent anemia s/p extensive evaluation (EGD X 2, colonoscopy, capsule endoscopy in 2021)now following with hematology. Last colonoscopy in January 2021 at outside facility with portal colopathy and diffuse edema, changes of C. difficile colitis. Stated there appeared to be 3 polyps that were not removed due to concerns for bleeding.   Last in the office July 30, 2020.  Regarding cirrhosis, he is fairly stable.  Mental status is doing okay.  Felt he needed another paracentesis.  Also reported significant daily reflux on omeprazole 40 mg 4 times weekly.  Last hemoglobin was 7.1 and he denied further rectal bleeding or melena.  Continue to avoid alcohol.  Palliative nurse was coming once monthly.  Recommended omeprazole 40 mg daily, scheduled paracentesis, lab work same day including CBC, CMP, INR, follow-up in 4 weeks.  Stated overall, prognosis remains relatively poor.  He has undergone 5 paracentesis since his last visit with last paracentesis 10/22/2020 yielding 3 L.  No SBP.  Last hemoglobin 10/24/2020 was 7.7.  He did receive 1 unit PRBCs on 4/28 due to hemoglobin of 7.0.  4/26, iron 39 (L), saturation  14% (L), ferritin 167.  Last iron infusion in February 2022.  He has follow-up 11/21/2020 with Dr. Delton Coombes.  Received note from palliative care dated 10/08/2020.  Patient continues to desire aggressive treatment of acute and chronic illnesses including CPR, but stated he would not want to "live on a ventilator".  She did note intermittent pain to the right side of his chest that radiated to his right side and mid back which he felt may be due to his gallbladder.  There was question whether patient would be a candidate for cholecystectomy due to being high risk.  Also noted frequent paracentesis and there was question if patient would be a candidate for Pleurx insertion.  Stated they would send a note to GI for review of recommendations.  Today:   Abdominal Pain: Pain bilaterally, under his rib cage. Chronic. Improves somewhat when he has fluid drawn off. Some pain is worsened with greasy foods. Had a couple episodes of nausea/vomiting since his last visit which occurred after taking several medications at 1 time and drinking juice.  Not necessarily after a full meal. Pain is usually it is a quick come and go, but can last 30 minutes or so. Can rub pain cream on it and it helps. Trying to do para every 2 weeks.   Lactulose twice a day if no appts. At least once a day otherwise. Bowels moving 2-3 times a day usually. May skip a day at times.  Continues Xifaxan BID. Mental status is doing well. Swelling is about the same. Still with abdominal distension and LE edema. No significant change. Abdomen  isn't tight with para last week. No brbpr or melena.  Continues with Levaquin 3 days a week after dialysis.  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  Mentions eating cheese-itz.   GERD is much better on omeprazole 40 mg daily. No dysphagia.   No NSAIDs.   Past Medical History:  Diagnosis Date  . A-fib [facility])    when initially stating dialysis january 2021 at [facility] per spouse; never heard anything else about  it  . Alcoholic cirrhosis ([place])    patient reports completing hep a and b vaccines in 2006  . Anemia    has had 3 units of prbcs 2013  . Anxiety   . Arthritis   . Asymptomatic gallstones    Ultrasound in 2006  . C. difficile colitis   . Cholelithiasis   . Chronic kidney disease    Dialysis M/W/F/Sa  . Depression   . Diabetes ([place])   . Duodenal ulcer with hemorrhage    per patient in 2006 or 2007, records have been requested  . Dyspnea    low oxygen level -  has O2 2 L all day  . 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 GERD (gastroesophageal reflux disease)   . Heart burn   . 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 HOH (hard of hearing)   . Hypertension   . Liver cirrhosis ([place])   . SBP (spontaneous bacterial peritonitis) ([place])    2020, November 2021.   [name] Splenomegaly    Ultrasound in 2006    Past Surgical History:  Procedure Laterality Date  . AGILE CAPSULE N/A 09/20/2019   Procedure: AGILE CAPSULE;  Surgeon: Daneil Dolin, MD;  [facility];  Service: Endoscopy;  Laterality: N/A;  . APPENDECTOMY    . AV FISTULA PLACEMENT Left 08/17/2019   Procedure: ARTERIOVENOUS (AV) FISTULA CREATION VERSES ARTERIOVENOUS GRAFT;  Surgeon: [name], MD;  Location: [place];  Service: Vascular;  Laterality: Left;  . BASCILIC VEIN TRANSPOSITION Left 09/28/2019   Procedure: LEFT SECOND STAGE BASCILIC VEIN TRANSPOSITION;  Surgeon: [name], MD;  Location: [place];  Service: Vascular;  Laterality: Left;  . BIOPSY  02/03/2018   Procedure: BIOPSY;  Surgeon: Daneil Dolin, MD;  [facility];  Service: Endoscopy;;  bx of gastric polyps  . BIOPSY  09/19/2019   Procedure: BIOPSY;  Surgeon: Danie Binder, MD;  [facility];  Service: Endoscopy;;  . CATARACT EXTRACTION Right   . COLONOSCOPY  03/10/2005   Rectal polyp as described above, removed with snare. Left sided  diverticula. The remainder of the colonic mucosa appeared normal. Inflammed polyp on path.   . COLONOSCOPY  04/1999   Dr. Thea Silversmith polyps removed,  Path showed hyperplastic  . COLONOSCOPY  10/2015   Dr. Britta Mccreedy: diverticulosis, single sessile tubular adenoma 3-66mm in size removed from descending colon.   . COLONOSCOPY WITH ESOPHAGOGASTRODUODENOSCOPY (EGD)  03/31/2012   GUR:KYHCWCB AVMs. Colonic diverticulosis. tubular adenoma colon  . COLONOSCOPY WITH ESOPHAGOGASTRODUODENOSCOPY (EGD)  06/2019   FORSYTH: small esophageal varices without high risk stigmata, single large AVM on lesser curvature in gastric body s/p APC ablation, gastric antral and duodenal bulb polyposis. No significant source to explain transfusion dependent anemia. Colonoscopy with portal colopathy and diffuse edema, changes of Cdiff colitis on colonoscopy  . ESOPHAGEAL BANDING N/A 02/03/2018   Procedure: ESOPHAGEAL BANDING;  Surgeon: Daneil Dolin, MD;  [facility];  Service:  Endoscopy;  Laterality: N/A;  . ESOPHAGOGASTRODUODENOSCOPY  01/15/2005   Three columns grade 1 to 2 esophageal varices, otherwise normal esophageal mucosa.  Esophagus was not manipulated otherwise./Nodularity of the antrum with overlying erosions, nonspecific finding. Path showed rare H.pylori  . ESOPHAGOGASTRODUODENOSCOPY  09/2008   Dr. Gaylene Brooks columns of grade 2-3 esoph varices, only one column was prominent. Portal gastropathy, multiple gastrc polyps at antrum, two were 2cm with black eschar, bulbar polyps, bulbar erosions  . ESOPHAGOGASTRODUODENOSCOPY  11/2004   Dr. Brantley Stage 3 esoph varices  . ESOPHAGOGASTRODUODENOSCOPY  03/31/2012   RMR: 4 columns(3-GR 2, 1-GR1) non-bleeding esophageal varices, portal gastropathy, small HH, early GAVE, multiple gastric polyps   . ESOPHAGOGASTRODUODENOSCOPY (EGD) WITH PROPOFOL N/A 02/03/2018   Dr. Gala Romney: Esophageal varices, 3 columns grade 1-2.  Portal hypertensive gastropathy.  Multiple gastric polyps, biopsy consistent with hyperplastic.  [name] ESOPHAGOGASTRODUODENOSCOPY (EGD) WITH  PROPOFOL N/A 09/19/2019   Fields: grade I and II esophageal varcies, mild portal hypertensive gastropathy, moderate gastritis but no H. pylori, single hyperplastic gastric polyp removed, obvious source for melena/transfusion dependent anemia not identified, may be due to friable gastric and duodenal mucosa in the setting of portal hypertension  . GASTRIC VARICES BANDING  03/31/2012   Procedure: GASTRIC VARICES BANDING;  Surgeon: Daneil Dolin, MD;  [facility];  Service: Endoscopy;  Laterality: N/A;  . GIVENS CAPSULE STUDY N/A 09/21/2019   Poor study with a lot of debris obstructing much of the view of the first approximate 3 hours out of 6 hours.  No obvious source of bleeding identified.  Sequela of bleeding and old blood seen in the form of"whisps" of blood, blood flecks mostly toward the end of the study.  . IR REMOVAL TUN CV CATH W/O FL  02/15/2020  . POLYPECTOMY  09/19/2019   Procedure: POLYPECTOMY;  Surgeon: Danie Binder, MD;  [facility];  Service: Endoscopy;;  . UMBILICAL HERNIA REPAIR  2017   exploratory laparotomy, abdominal washout    Current Outpatient Medications  Medication Sig Dispense Refill  . acetaminophen (TYLENOL) 500 MG tablet Take 1,000 mg by mouth as needed for mild pain.    . Alcohol Swabs (ALCOHOL PREP) 70 % PADS Apply 1 each topically 3 (three) times daily.    [name] ALPRAZolam (XANAX) 0.5 MG tablet Take 0.5 mg by mouth daily as needed.    . Ascorbic Acid (VITAMIN C) 1000 MG tablet Take 1,000 mg by mouth 2 (two) times daily.    . calcium carbonate (TUMS - DOSED IN MG ELEMENTAL CALCIUM) 500 MG chewable tablet Chew 1 tablet by mouth 3 (three) times daily with meals.    . Calcium Carbonate-Simethicone (ALKA-SELTZER HEARTBURN + GAS) 750-80 MG CHEW Chew by mouth as needed.    . diltiazem (TIAZAC) 120 MG 24 hr capsule Take 120 mg by mouth daily.    . furosemide (LASIX) 40 MG tablet Take 80 mg by mouth daily.    [name] gabapentin (NEURONTIN) 100 MG capsule  TAKE 1 CAPSULE BY MOUTH AT BEDTIME 90 capsule 0  . hydrocortisone (ANUSOL-HC) 2.5 % rectal cream Place 1 application rectally 2 (two) times daily. (Patient taking differently: Place 1 application rectally 2 (two) times daily as needed for hemorrhoids or anal itching.) 30 g 1  . hydrOXYzine (VISTARIL) 25 MG capsule Take 25 mg by mouth 3 (three) times daily as needed for itching.     . Insulin Degludec (TRESIBA FLEXTOUCH [place]) Inject into the skin.    [name] insulin degludec (TRESIBA FLEXTOUCH)  200 UNIT/ML FlexTouch Pen Inject 120 Units into the skin at bedtime. When blood sugars are up (Patient taking differently: Inject 120 Units into the skin at bedtime.)    . iron polysaccharides (NIFEREX) 150 MG capsule Take 150 mg by mouth at bedtime.     [name] lactulose (CONSTULOSE) 10 GM/15ML solution TAKE 45 MLS EVERY MORNING AND TAKE 45 MLS EVERY EVENING (Patient taking differently: TAKE 45 MLS EVERY MORNING AND TAKE 30-45 MLS EVERY EVENING) 2700 mL 12  . Lancet Devices (ADJUSTABLE LANCING DEVICE) MISC Apply 1 each topically daily.    [name] levofloxacin (LEVAQUIN) 500 MG tablet Take 500 mg 3 times a week after dialysis. 12 tablet 11  . metoprolol succinate (TOPROL-XL) 25 MG 24 hr tablet Take 1 tablet (25 mg total) by mouth daily. Take at bedtime  on Monday ,Wednesday and Friday due to dialaylisis (Patient taking differently: Take 25 mg by mouth See admin instructions. Take 25 mg by mouth at bedtime every night and 25 mg in morning on  Sun, Tues, Thurs and Sat)    . midodrine (PROAMATINE) 10 MG tablet Take 1 tablet (10 mg total) by mouth 2 (two) times daily with a meal. 60 tablet 1  . milk thistle 175 MG tablet Take 175 mg by mouth 3 (three) times daily.     . mirtazapine (REMERON) 30 MG tablet Take 1 tablet (30 mg total) by mouth at bedtime. 30 tablet 3  . Multiple Vitamin (MULTIVITAMIN WITH MINERALS) TABS tablet Take 1 tablet by mouth in the morning.     [name] NOVOFINE 32G X 6 MM MISC Inject into the skin.    [name] nystatin  (MYCOSTATIN/NYSTOP) powder Apply topically 3 (three) times daily. Into groin area and affected area (Patient taking differently: Apply topically as needed. Into groin area and affected area) 45 g 0  . Omega-3 1000 MG CAPS  ( 200 mg ), bid, # 120    . omega-3 acid ethyl esters (LOVAZA) 1 g capsule Take 2 g by mouth 2 (two) times daily.     [name] omeprazole (PRILOSEC) 40 MG capsule Take 40 mg by mouth daily.    . ondansetron (ZOFRAN) 4 MG tablet Take 4 mg by mouth as needed for nausea or vomiting.    [name] oxyCODONE-acetaminophen (PERCOCET/ROXICET) 5-325 MG tablet Take 1 tablet by mouth every 4 (four) hours as needed for moderate pain.    . Ped Vitamins ACD Fl-Iron (TRI-VIT/FLUORIDE/IRON PO)  daily    . pravastatin (PRAVACHOL) 20 MG tablet Take 20 mg by mouth in the morning.     . Probiotic Product (PROBIOTIC DAILY PO) Take 1 tablet by mouth in the morning.     [name] spironolactone (ALDACTONE) 50 MG tablet Take 50 mg by mouth in the morning.     . tamsulosin (FLOMAX) 0.4 MG CAPS capsule Take 0.4 mg by mouth every evening.    . thiamine (VITAMIN B-1) 100 MG tablet Take 1 tablet by mouth daily.    [name] tiZANidine (ZANAFLEX) 2 MG tablet Take 2 mg by mouth 2 (two) times daily as needed for muscle spasms.     [name] triamcinolone cream (KENALOG) 0.1 % Apply 1 application topically 2 (two) times daily as needed (dry skin Itching).   3  . venlafaxine (EFFEXOR) 75 MG tablet Take 75 mg by mouth 3 (three) times daily with meals.     [name] XIFAXAN 550 MG TABS tablet TAKE 1 TABLET BY MOUTH TWICE DAILY (Patient taking differently: Take 550 mg by mouth 2 (  two) times daily.) 60 tablet 11   No current facility-administered medications for this visit.    Allergies as of 10/31/2020 - Review Complete 10/31/2020  Allergen Reaction Noted  . Tape Itching and Rash 09/18/2019    Family History  Problem Relation Age of Onset  . Colon cancer Father 48       deceased age 88  . Breast cancer Sister        deceased  . Diabetes Sister    . Stroke Mother   . Healthy Son   . Healthy Daughter   . Lung cancer Neg Hx   . Ovarian cancer Neg Hx     Social History   Socioeconomic History  . Marital status: Married    Spouse name: Not on file  . Number of children: 2  . Years of education: Not on file  . Highest education level: Not on file  Occupational History  . Occupation: RETIRED    Employer: SELF EMPLOYED  Tobacco Use  . Smoking status: Former Smoker    Packs/day: 2.00    Years: 25.00    Pack years: 50.00    Types: Cigarettes    Quit date: 01/28/1986    Years since quitting: 34.7  . Smokeless tobacco: Never Used  Vaping Use  . Vaping Use: Never used  Substance and Sexual Activity  . Alcohol use: No    Comment: quit in 05/2013  . Drug use: No  . Sexual activity: Not Currently  Other Topics Concern  . Not on file  Social History Narrative   Two step children from second marriage (divorced) who live with him along with some grandchildren.    Social Determinants of Health   Financial Resource Strain: Not on file  Food Insecurity: No Food Insecurity  . Worried About Charity fundraiser in the Last Year: Never true  . Ran Out of Food in the Last Year: Never true  Transportation Needs: No Transportation Needs  . Lack of Transportation (Medical): No  . Lack of Transportation (Non-Medical): No  Physical Activity: Inactive  . Days of Exercise per Week: 0 days  . Minutes of Exercise per Session: 0 min  Stress: Not on file  Social Connections: Not on file    Review of Systems: Gen: Denies fever, chills, cold or flulike symptoms, presyncope, syncope. CV: Denies chest pain or palpitations. Resp: Admits to shortness of breath with exertion.  No shortness of breath at rest. GI: See HPI  Heme: See HPI  Physical Exam: BP (!) 116/56   Pulse 82   Temp (!) 97.1 F (36.2 C) (Temporal)   Ht 5\' 7"  (1.702 m)   Wt 219 lb (99.3 kg)   BMI 34.30 kg/m  General:   Alert and oriented. No distress noted. Pleasant  and cooperative.  Head:  Normocephalic and atraumatic. Eyes:  Conjuctiva clear without scleral icterus. Heart:  S1, S2 present without murmurs appreciated. Lungs:  Clear to auscultation bilaterally. No wheezes, rales, or rhonchi. No distress.  Abdomen:  +BS. Abdomen is distended but soft. Mild TTP in the RUQ/rigth flank. Minimal TTP LUQ. No rebound or guarding.  No masses noted.  Difficult to assess for HSM due to exam completed in chair. Msk:  Symmetrical without gross deformities. Normal posture. Extremities:  Without edema. Neurologic:  Alert and  oriented x4 Psych:  Normal mood and affect.   Assessment: 75 year old male with history of decompensated end-stage alcoholic cirrhosis complicated by chronic volume overload in the setting of CKD on  dialysis, requiring frequent paracentesis, recurrent hepatic encephalopathy due to medication noncompliance, recurrent SBP (2020 in November 2021) now on Levaquinat renal dosesfor SBP prophylaxis, portal hypertensive gastropathy, known grade 1/2 esophageal varices with EGD in April 2021 not on BB due to increased risk of SBP.Also with history of GERD, right sided pain, and transfusion dependent anemia following with hematology.  He is presenting today for follow-up with chief complaint of bilateral upper abdominal pain.  Upper abdominal pain: Several months of intermittent short-lived right-sided abdominal pain, previously reported in September 2021, now reporting intermittent bilateral upper abdominal pain though right-sided pain is more prominent.  Symptoms are typically quick jolts of pain but can last up to about 30 minutes.  Also with a numbness sensation along his right abdomen.  Symptoms can be affected by greasy meals though not always.  Couple episodes of nausea/vomiting not necessarily associated with the pain.  Also with chronic back pain. Improvement following paracentesis or with rubbing pain cream on his abdomen. Known history of gallstones  on prior CT in November 2021.  On exam, he does have some reproducible tenderness in his right upper quadrant/right flank.  Suspect pain is likely multifactorial. Likely influenced by recurrent ascites, known HSM, possible radicular pain/neuropathic pain. Can't rule out gallbladder etiology, but I feel symptoms are less consistent with this.  Additionally, he is a very high risk surgical candidate and we discussed the need to avoid surgery at all cost. We will update and abdominal US, discussed the need to strictly follow a low-fat diet, continue paracentesis every 2 weeks, continue other supportive measures, and monitor for worsening.   Cirrhosis: Decompensated.  MELD 27 based on labs in March 2022.  He is currently at his baseline.  No evidence of hepatic encephalopathy today maintaining on lactulose daily to twice daily and Xifaxan twice daily.  Continues with peripheral edema with diuretics managed by nephrology and undergoing dialysis 3 days/week. Abdomen is distended though not tense today.  He will have another paracentesis next week as he is receiving these about every 2 weeks.  Of note, I received a note from palliative care mentioning the possibility of Pleurx insertion for management of recurrent ascites.  I have advised against this for now as he is managing well with para every 2 weeks and considering his history of recurrent SBP. He is compliant with Levaquin and will continue this.  I also discussed with him the importance of following a strict low-sodium diet as it does not sound like he is. He continues to avoid alcohol.  For cirrhosis maintenance, he is due for [facility] screening.  He is also due for esophageal variceal screening with last EGD in April 2021 revealing grade 1-2 varices, not on beta-blocker due to risk of SBP.  Will need to discuss procedures with Dr. Gala Romney.  We will also update CMP and INR to keep close check on this considering decompensation.  Anemia: History of transfusion  dependent anemia likely chronic occult GI bleeding and ESRD on hemodialysis, following with hematology.  He has completed extensive evaluation with EGD x2, colonoscopy, and capsule endoscopy in 2021.  Known grade 1/2 esophageal varices and hypertensive gastropathy on EGD in April 2021, technically due for esophageal variceal surveillance at this time.  Also history of colon polyps with last colonoscopy in January 2021; 3 polyps not removed due to concerns for bleeding as this was in the setting of acute C. difficile colitis.  We had previously planned for updating colonoscopy in November 2021, but patient  ended up hospitalized with acute illness and this was cancelled. Further procedures have not been pursued due to chronic medical conditions. He is currently without overt GI bleeding.  He did have 3 positive fecal occult blood test March 2022.  Required 1 unit PRBCs in April 2022.  Will discuss candidacy for EGD and possible colonoscopy with Dr. Gala Romney.  GERD: Much improved on omeprazole 40 mg daily.   Plan:  1. CMP, INR  2.  Abdominal ultrasound   3.  Continue standing orders for paracentesis which patient is receiving about every 2 weeks.  4.  Continue current dose of Lasix and Aldactone and following with nephrology.  5.  Continue lactulose 25 mg in the morning and evening developed 3 semiformed bowel movements daily.  6.  Continue Xifaxan twice daily.  7.  Continue Levaquin 3 days a week after dialysis.  8.  Strict low-sodium diet.  No more than 2000 mg of sodium daily.  9.  Follow strict low-fat diet.  10.  Continue omeprazole 40 mg daily 30 minutes before breakfast.  11.  Will discuss with Dr. Gala Romney regarding candidacy for EGD for variceal screening and possible colonoscopy due to history of known polyps.  11. Follow-up in 3 months.     [name], PA-C [facility] 10/31/2020

## 2020-10-31 ENCOUNTER — Ambulatory Visit (INDEPENDENT_AMBULATORY_CARE_PROVIDER_SITE_OTHER): Payer: Medicare Other | Admitting: Gastroenterology

## 2020-10-31 ENCOUNTER — Other Ambulatory Visit: Payer: Self-pay

## 2020-10-31 ENCOUNTER — Encounter: Payer: Self-pay | Admitting: Gastroenterology

## 2020-10-31 VITALS — BP 116/56 | HR 82 | Temp 97.1°F | Ht 67.0 in | Wt 219.0 lb

## 2020-10-31 DIAGNOSIS — D649 Anemia, unspecified: Secondary | ICD-10-CM | POA: Diagnosis not present

## 2020-10-31 DIAGNOSIS — K219 Gastro-esophageal reflux disease without esophagitis: Secondary | ICD-10-CM | POA: Diagnosis not present

## 2020-10-31 DIAGNOSIS — K7031 Alcoholic cirrhosis of liver with ascites: Secondary | ICD-10-CM

## 2020-10-31 DIAGNOSIS — R1010 Upper abdominal pain, unspecified: Secondary | ICD-10-CM

## 2020-10-31 NOTE — Patient Instructions (Addendum)
We will arrange for you to have an ultrasound of your abdomen in the near future at [facility] can also have a paracentesis completed at the same time of your ultrasound.  I have also placed lab orders for you to complete at [facility].  You can complete this blood work the same day of your ultrasound.  Continue your current dose of Lasix and Aldactone.  Continue lactulose 45 mL in the morning and evening with goal of 3 semiformed bowel movements daily.  Continue Xifaxan twice daily.  Follow a strict low-sodium diet.  No more than 2000 mg of sodium per day.  This includes everything you eat and drink.  I also recommend you follow a strict low-fat diet as it is unclear if your right-sided abdominal pain may be influenced by underlying gallstones.  As we discussed, you are very high risk for surgery and we would like to avoid this at all cost.   -Avoid all fried, fatty, greasy foods.  -As we discussed, most " snack foods" are high in fat content as well as sodium content.  Please limit these.  Continue omeprazole 40 mg daily 30 minutes before breakfast.  I will discuss with Dr. Gala Romney if he would like to proceed with an upper endoscopy.  We will reach out to you with his recommendations.  We will plan to see back in 3 months.  Do not hesitate to call if questions or concerns prior.  It was good to see you today!  [name], PA-C [facility]

## 2020-11-01 ENCOUNTER — Telehealth: Payer: Self-pay | Admitting: Gastroenterology

## 2020-11-01 NOTE — Telephone Encounter (Signed)
Please let patient know I have discussed this case with Dr. Gala Romney.  Recommend we proceed with EGD and colonoscopy in the near future.  This will re-evaluate his esophageal varices and evaluate his ongoing transfusion dependent anemia.  We will also be able to resect the colon polyps that were previously identified in 2021, but not resected at that time.   If patient is agreeable, please arrange:  EGD + TCS with propofol with RMR.  ASA IV. Hold iron x 7 days prior to procedure.  Day prior to procedure: 1/2 dose of Tresiba (60 units at bedtime) Date of procedure: No morning diabetes medications.

## 2020-11-04 NOTE — Telephone Encounter (Signed)
Called pt and spoke with spouse. Agreeable to procedure. Needs tues/thurs. Advised will call to schedule once we receive Dr. Roseanne Kaufman future schedule.

## 2020-11-04 NOTE — Progress Notes (Signed)
Cc'ed to pcp °

## 2020-11-05 ENCOUNTER — Ambulatory Visit (INDEPENDENT_AMBULATORY_CARE_PROVIDER_SITE_OTHER): Payer: Medicare Other

## 2020-11-05 ENCOUNTER — Other Ambulatory Visit: Payer: Self-pay

## 2020-11-05 DIAGNOSIS — R339 Retention of urine, unspecified: Secondary | ICD-10-CM

## 2020-11-05 NOTE — Patient Instructions (Signed)

## 2020-11-05 NOTE — Progress Notes (Signed)
Cath Change/ Replacement  Patient is present today for a catheter change due to urinary retention.  26ml of water was removed from the balloon, a 16FR foley cath was removed with out difficulty.  Patient was cleaned and prepped in a sterile fashion with betadine. A 16 FR foley cath was replaced into the bladder no complications were noted Urine return was noted 71ml and urine was yellow in color. The balloon was filled with 63ml of sterile water. A leg bag was attached for drainage.  A night bag was also given to the patient and patient was given instruction on how to change from one bag to another. Patient was given proper instruction on catheter care.    Performed by: Abdias Hickam, LPN  Follow up: Keep next scheduled OV

## 2020-11-07 ENCOUNTER — Encounter (HOSPITAL_COMMUNITY): Payer: Self-pay

## 2020-11-07 ENCOUNTER — Other Ambulatory Visit: Payer: Self-pay

## 2020-11-07 ENCOUNTER — Other Ambulatory Visit (HOSPITAL_COMMUNITY)
Admission: RE | Admit: 2020-11-07 | Discharge: 2020-11-07 | Disposition: A | Discharge status: Home / Self Care | Payer: Medicare Other | Source: Ambulatory Visit | Attending: Gastroenterology | Admitting: Gastroenterology

## 2020-11-07 ENCOUNTER — Encounter (HOSPITAL_COMMUNITY): Payer: Self-pay | Admitting: *Deleted

## 2020-11-07 ENCOUNTER — Ambulatory Visit (HOSPITAL_COMMUNITY)
Admission: RE | Admit: 2020-11-07 | Discharge: 2020-11-07 | Disposition: A | Discharge status: Home / Self Care | Payer: Medicare Other | Source: Ambulatory Visit | Attending: Internal Medicine | Admitting: Internal Medicine

## 2020-11-07 ENCOUNTER — Ambulatory Visit (HOSPITAL_COMMUNITY)
Admission: RE | Admit: 2020-11-07 | Discharge: 2020-11-07 | Disposition: A | Discharge status: Home / Self Care | Payer: Medicare Other | Source: Ambulatory Visit | Attending: Gastroenterology | Admitting: Gastroenterology

## 2020-11-07 DIAGNOSIS — R188 Other ascites: Secondary | ICD-10-CM | POA: Diagnosis present

## 2020-11-07 DIAGNOSIS — K7031 Alcoholic cirrhosis of liver with ascites: Secondary | ICD-10-CM | POA: Diagnosis present

## 2020-11-07 DIAGNOSIS — R1010 Upper abdominal pain, unspecified: Secondary | ICD-10-CM | POA: Insufficient documentation

## 2020-11-07 LAB — COMPREHENSIVE METABOLIC PANEL
ALT: 19 U/L (ref 0–44)
AST: 23 U/L (ref 15–41)
Albumin: 2.5 g/dL — ABNORMAL LOW (ref 3.5–5.0)
Alkaline Phosphatase: 202 U/L — ABNORMAL HIGH (ref 38–126)
Anion gap: 8 (ref 5–15)
BUN: 23 mg/dL (ref 8–23)
CO2: 28 mmol/L (ref 22–32)
Calcium: 8.4 mg/dL — ABNORMAL LOW (ref 8.9–10.3)
Chloride: 95 mmol/L — ABNORMAL LOW (ref 98–111)
Creatinine, Ser: 2.56 mg/dL — ABNORMAL HIGH (ref 0.61–1.24)
GFR, Estimated: 26 mL/min — ABNORMAL LOW (ref >60)
Glucose, Bld: 149 mg/dL — ABNORMAL HIGH (ref 70–99)
Potassium: 4.1 mmol/L (ref 3.5–5.1)
Sodium: 131 mmol/L — ABNORMAL LOW (ref 135–145)
Total Bilirubin: 0.7 mg/dL (ref 0.3–1.2)
Total Protein: 6.6 g/dL (ref 6.5–8.1)

## 2020-11-07 LAB — BODY FLUID CELL COUNT WITH DIFFERENTIAL
Eos, Fluid: 2 %
Lymphs, Fluid: 77 %
Monocyte-Macrophage-Serous Fluid: 16 % — ABNORMAL LOW (ref 50–90)
Neutrophil Count, Fluid: 5 % (ref 0–25)
Other Cells, Fluid: 1 %
Total Nucleated Cell Count, Fluid: 164 cu mm (ref 0–1000)

## 2020-11-07 LAB — GRAM STAIN: Gram Stain: NONE SEEN

## 2020-11-07 LAB — PROTIME-INR
INR: 1.2 (ref 0.8–1.2)
Prothrombin Time: 15 seconds (ref 11.4–15.2)

## 2020-11-07 MED ORDER — ALBUMIN HUMAN 25 % IV SOLN
0.0000 g | Freq: Once | INTRAVENOUS | Status: DC
  Filled 2020-11-07: qty 400

## 2020-11-07 NOTE — Procedures (Signed)
   US guided LLQ paracentesis  3.2 L cloudy yellow fluid Sent for labs per MD  Tolerated well  EBL: less than 1 cc

## 2020-11-07 NOTE — Discharge Instructions (Signed)
Paracentesis Procedure Note  Jeffrey [name]  [id number]  [birth year]  Date:11/07/20  Time:10:14 AM   Provider Performing:Keniyah Gelinas, Graceann Congress    Procedure: Paracentesis with imaging guidance [phone])  Indication(s) Ascites  Consent Risks of the procedure as well as the alternatives and risks of each were explained to the patient and/or caregiver.  Consent for the procedure was obtained and is signed in the bedside chart  Anesthesia Topical only with 1% lidocaine    Time Out Verified patient identification, verified procedure, site/side was marked, verified correct patient position, special equipment/implants available, medications/allergies/relevant history reviewed, required imaging and test results available.   Sterile Technique Maximal sterile technique including full sterile barrier drape, hand hygiene, sterile gown, sterile gloves, mask, hair covering, sterile ultrasound probe cover (if used).   Procedure Description Ultrasound used to identify appropriate peritoneal anatomy for placement and overlying skin marked.  Area of drainage cleaned and draped in sterile fashion. Lidocaine was used to anesthetize the skin and subcutaneous tissue.     Complications/Tolerance None; patient tolerated the procedure well.     Specimen(s) Peritoneal fluid

## 2020-11-07 NOTE — Sedation Documentation (Signed)
Pt tolerated left sided paracentesis procedure well today and 3.2 Liters of cloudy amber fluid removed with labs collected and sent for processing. PT vital signs remained stable and no acute distress noted at discharge via wheelchair to lobby with wife waiting. PT verbalized understanding of discharge instructions.

## 2020-11-08 LAB — PATHOLOGIST SMEAR REVIEW

## 2020-11-13 LAB — CULTURE, BODY FLUID W GRAM STAIN -BOTTLE: Culture: NO GROWTH

## 2020-11-14 ENCOUNTER — Inpatient Hospital Stay (HOSPITAL_COMMUNITY): Payer: Medicare Other

## 2020-11-14 ENCOUNTER — Other Ambulatory Visit (HOSPITAL_COMMUNITY): Payer: Medicare Other

## 2020-11-18 ENCOUNTER — Other Ambulatory Visit (HOSPITAL_COMMUNITY): Payer: Self-pay | Admitting: Surgery

## 2020-11-18 DIAGNOSIS — D61818 Other pancytopenia: Secondary | ICD-10-CM

## 2020-11-18 DIAGNOSIS — D649 Anemia, unspecified: Secondary | ICD-10-CM

## 2020-11-19 ENCOUNTER — Other Ambulatory Visit: Payer: Self-pay

## 2020-11-19 ENCOUNTER — Inpatient Hospital Stay (HOSPITAL_COMMUNITY): Payer: Medicare Other | Attending: Hematology

## 2020-11-19 ENCOUNTER — Encounter (HOSPITAL_COMMUNITY): Payer: Self-pay

## 2020-11-19 ENCOUNTER — Ambulatory Visit (HOSPITAL_COMMUNITY)
Admission: RE | Admit: 2020-11-19 | Discharge: 2020-11-19 | Disposition: A | Discharge status: Home / Self Care | Payer: Medicare Other | Source: Ambulatory Visit | Attending: Internal Medicine | Admitting: Internal Medicine

## 2020-11-19 ENCOUNTER — Telehealth: Payer: Self-pay

## 2020-11-19 DIAGNOSIS — E538 Deficiency of other specified B group vitamins: Secondary | ICD-10-CM | POA: Diagnosis not present

## 2020-11-19 DIAGNOSIS — Z800 Family history of malignant neoplasm of digestive organs: Secondary | ICD-10-CM | POA: Insufficient documentation

## 2020-11-19 DIAGNOSIS — K7460 Unspecified cirrhosis of liver: Secondary | ICD-10-CM | POA: Insufficient documentation

## 2020-11-19 DIAGNOSIS — D631 Anemia in chronic kidney disease: Secondary | ICD-10-CM | POA: Insufficient documentation

## 2020-11-19 DIAGNOSIS — R188 Other ascites: Secondary | ICD-10-CM | POA: Diagnosis present

## 2020-11-19 DIAGNOSIS — D649 Anemia, unspecified: Secondary | ICD-10-CM

## 2020-11-19 DIAGNOSIS — K7210 Chronic hepatic failure without coma: Secondary | ICD-10-CM | POA: Diagnosis not present

## 2020-11-19 DIAGNOSIS — D61818 Other pancytopenia: Secondary | ICD-10-CM | POA: Insufficient documentation

## 2020-11-19 DIAGNOSIS — D509 Iron deficiency anemia, unspecified: Secondary | ICD-10-CM | POA: Insufficient documentation

## 2020-11-19 DIAGNOSIS — K922 Gastrointestinal hemorrhage, unspecified: Secondary | ICD-10-CM | POA: Insufficient documentation

## 2020-11-19 DIAGNOSIS — N186 End stage renal disease: Secondary | ICD-10-CM | POA: Diagnosis not present

## 2020-11-19 DIAGNOSIS — D539 Nutritional anemia, unspecified: Secondary | ICD-10-CM | POA: Diagnosis not present

## 2020-11-19 DIAGNOSIS — Z87891 Personal history of nicotine dependence: Secondary | ICD-10-CM | POA: Diagnosis not present

## 2020-11-19 DIAGNOSIS — Z992 Dependence on renal dialysis: Secondary | ICD-10-CM | POA: Diagnosis not present

## 2020-11-19 LAB — COMPREHENSIVE METABOLIC PANEL
ALT: 17 U/L (ref 0–44)
AST: 21 U/L (ref 15–41)
Albumin: 2.5 g/dL — ABNORMAL LOW (ref 3.5–5.0)
Alkaline Phosphatase: 193 U/L — ABNORMAL HIGH (ref 38–126)
Anion gap: 9 (ref 5–15)
BUN: 25 mg/dL — ABNORMAL HIGH (ref 8–23)
CO2: 27 mmol/L (ref 22–32)
Calcium: 8.2 mg/dL — ABNORMAL LOW (ref 8.9–10.3)
Chloride: 94 mmol/L — ABNORMAL LOW (ref 98–111)
Creatinine, Ser: 2.81 mg/dL — ABNORMAL HIGH (ref 0.61–1.24)
GFR, Estimated: 23 mL/min — ABNORMAL LOW (ref >60)
Glucose, Bld: 194 mg/dL — ABNORMAL HIGH (ref 70–99)
Potassium: 4.4 mmol/L (ref 3.5–5.1)
Sodium: 130 mmol/L — ABNORMAL LOW (ref 135–145)
Total Bilirubin: 0.7 mg/dL (ref 0.3–1.2)
Total Protein: 6.2 g/dL — ABNORMAL LOW (ref 6.5–8.1)

## 2020-11-19 LAB — CBC WITH DIFFERENTIAL/PLATELET
Abs Immature Granulocytes: 0.02 10*3/uL (ref 0.00–0.07)
Basophils Absolute: 0 10*3/uL (ref 0.0–0.1)
Basophils Relative: 1 %
Eosinophils Absolute: 0.3 10*3/uL (ref 0.0–0.5)
Eosinophils Relative: 6 %
HCT: 23.3 % — ABNORMAL LOW (ref 39.0–52.0)
Hemoglobin: 7 g/dL — ABNORMAL LOW (ref 13.0–17.0)
Immature Granulocytes: 0 %
Lymphocytes Relative: 5 %
Lymphs Abs: 0.3 10*3/uL — ABNORMAL LOW (ref 0.7–4.0)
MCH: 31 pg (ref 26.0–34.0)
MCHC: 30 g/dL (ref 30.0–36.0)
MCV: 103.1 fL — ABNORMAL HIGH (ref 80.0–100.0)
Monocytes Absolute: 0.3 10*3/uL (ref 0.1–1.0)
Monocytes Relative: 6 %
Neutro Abs: 4.1 10*3/uL (ref 1.7–7.7)
Neutrophils Relative %: 82 %
Platelets: 84 10*3/uL — ABNORMAL LOW (ref 150–400)
RBC: 2.26 MIL/uL — ABNORMAL LOW (ref 4.22–5.81)
RDW: 16.4 % — ABNORMAL HIGH (ref 11.5–15.5)
WBC: 5 10*3/uL (ref 4.0–10.5)
nRBC: 0 % (ref 0.0–0.2)

## 2020-11-19 LAB — IRON AND TIBC
Iron: 33 ug/dL — ABNORMAL LOW (ref 45–182)
Saturation Ratios: 12 % — ABNORMAL LOW (ref 17.9–39.5)
TIBC: 276 ug/dL (ref 250–450)
UIBC: 243 ug/dL

## 2020-11-19 LAB — GRAM STAIN

## 2020-11-19 LAB — BODY FLUID CELL COUNT WITH DIFFERENTIAL
Eos, Fluid: 2 %
Lymphs, Fluid: 67 %
Monocyte-Macrophage-Serous Fluid: 28 % — ABNORMAL LOW (ref 50–90)
Neutrophil Count, Fluid: 3 % (ref 0–25)
Total Nucleated Cell Count, Fluid: 180 cu mm (ref 0–1000)

## 2020-11-19 LAB — FERRITIN: Ferritin: 156 ng/mL (ref 24–336)

## 2020-11-19 MED ORDER — PEG 3350-KCL-NA BICARB-NACL 420 G PO SOLR: 4000.0000 mL | ORAL | 0 refills | Status: DC

## 2020-11-19 NOTE — Telephone Encounter (Signed)
Pre-op phone call 01/14/21. Letter mailed with procedure instructions.

## 2020-11-19 NOTE — Telephone Encounter (Signed)
Called pt's wife, TCS/EGD w/Propofol ASA 4 w/Dr. Gala Romney scheduled for 01/16/21 at 10:15am. Rx for prep sent to pharmacy. Orders entered.

## 2020-11-19 NOTE — Procedures (Signed)
PreOperative Dx: Alcoholic cirrhosis, ascites Postoperative Dx: Alcoholic cirrhosis, ascites Procedure:   US guided paracentesis Radiologist:  Thornton Papas Anesthesia:  10 ml of1% lidocaine Specimen:  3.5 L of yellow ascitic fluid EBL:   < 1 ml Complications:             None

## 2020-11-19 NOTE — Sedation Documentation (Signed)
Patient tolerated left sided paracentesis procedure well today with vital signs remaining stable. 3.5 Liters of clear amber fluid removed with labs collected and sent for processing. Pt verbalized understanding of discharge instructions and pushed back to waiting area via wheelchair to wife at this time with NAD noted.

## 2020-11-20 LAB — PATHOLOGIST SMEAR REVIEW

## 2020-11-20 NOTE — Progress Notes (Signed)
Jeffrey [name],  [id number]   CLINIC:  Medical Oncology/Hematology  PCP:  Monico Blitz, MD [place] [address] [phone]   REASON FOR VISIT:  Follow-up for iron deficiency anemia  PRIOR THERAPY: Intermittent IV iron supplementation  CURRENT THERAPY: Epogen (at dialysis), IV iron supplementation, PRBC transfusions  INTERVAL HISTORY:  Jeffrey [name] 75 y.o. male returns for routine follow-up of his iron deficiency anemia, thrombocytopenia, and leukopenia.  He was last seen by Dr. Delton Coombes on 09/24/2020.  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  His anemia is related to his ESRD (receives Epogen at dialysis), end-stage liver disease/cirrhosis, and intermittent GI bleeding.  He is now transfusion dependent, and has been receiving PRBC transfusions on average every 4 weeks.  At today's visit, he reports feeling fair.  He has been checking CBC every 4 weeks, received 1 unit PRBC on 09/26/2020 (Hgb 7.0), no PRBC transfusion needed on 10/24/2020 (Hgb 7.7).  He continues to have easy bruising.  He has not noted any melena over the past month.  He denies any epistaxis, hematemesis, hematochezia.  He does continue to have his baseline level of fatigue.  He denies anginal chest pain with exertion, but does have some "soreness" in his bilateral lower anterior chest wall, which does seem to be slightly relieved after paracentesis.  He has shortness of breath when he tries to lay flat or if he exerts himself.  He denies any feelings of palpitations.  No syncopal episodes.  Denies any recent infections that he is aware of.  No fever, chills, night sweats.  He reports that he is maintaining a relatively stable weight, although it does fluctuate due to fluid retention and dialysis.  Complains of abdominal soreness and tightness as well as nausea and vomiting, which are relieved somewhat after paracentesis.  He has been receiving paracentesis every 2  weeks.  He has 80% energy and 100% appetite. He endorses that he is maintaining a stable weight.   REVIEW OF SYSTEMS:  Review of Systems  Constitutional:  Positive for fatigue. Negative for appetite change, chills, diaphoresis, fever and unexpected weight change.  HENT:   Negative for lump/mass and nosebleeds.   Eyes:  Negative for eye problems.  Respiratory:  Negative for cough, hemoptysis and shortness of breath.   Cardiovascular:  Positive for chest pain. Negative for leg swelling and palpitations.  Gastrointestinal:  Positive for abdominal pain, nausea and vomiting. Negative for blood in stool, constipation and diarrhea.  Genitourinary:  Negative for hematuria.   Skin: Negative.   Neurological:  Positive for dizziness. Negative for headaches and light-headedness.  Hematological:  Bruises/bleeds easily.     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 difficile colitis    Cholelithiasis    Chronic kidney disease    Dialysis M/W/F/Sa   Depression    Diabetes ([place])    Duodenal ulcer with hemorrhage    per patient in 2006 or 2007, records have been requested   Dyspnea    low oxygen  level -  has O2 2 L all day   Esophageal varices (HCC)    see [facility]   GERD (gastroesophageal reflux disease)    Heart burn    History of alcohol abuse    quit 05/2013   HOH (hard of hearing)    Hypertension    Liver cirrhosis ([place])    SBP (spontaneous bacterial peritonitis) ([place])    2020, November 2021.    Splenomegaly    Ultrasound in 2006   Past Surgical History:  Procedure Laterality Date   AGILE CAPSULE N/A 09/20/2019    Procedure: AGILE CAPSULE;  Surgeon: Daneil Dolin, MD;  [facility];  Service: Endoscopy;  Laterality: N/A;   APPENDECTOMY     AV FISTULA PLACEMENT Left 08/17/2019   Procedure: ARTERIOVENOUS (AV) FISTULA CREATION VERSES ARTERIOVENOUS GRAFT;  Surgeon: [name], MD;  Location: [place];  Service: Vascular;  Laterality: Left;   BASCILIC VEIN TRANSPOSITION Left 09/28/2019   Procedure: LEFT SECOND STAGE [place];  Surgeon: [name], MD;  Location: [place];  Service: Vascular;  Laterality: Left;   BIOPSY  02/03/2018   Procedure: BIOPSY;  Surgeon: Daneil Dolin, MD;  [facility];  Service: Endoscopy;;  bx of gastric polyps   BIOPSY  09/19/2019   Procedure: BIOPSY;  Surgeon: Danie Binder, MD;  [facility];  Service: Endoscopy;;   CATARACT EXTRACTION Right    COLONOSCOPY  03/10/2005   Rectal polyp as described above, removed with snare. Left sided  diverticula. The remainder of the colonic mucosa appeared normal. Inflammed polyp on path.   COLONOSCOPY  04/1999   Dr. Thea Silversmith polyps removed,  Path showed hyperplastic   COLONOSCOPY  10/2015   Dr. Britta Mccreedy: diverticulosis, single sessile tubular adenoma 3-42m in size removed from descending colon.    COLONOSCOPY WITH ESOPHAGOGASTRODUODENOSCOPY (EGD)  03/31/2012   RFHQ:[id number]. Colonic diverticulosis. tubular adenoma colon   COLONOSCOPY WITH ESOPHAGOGASTRODUODENOSCOPY (EGD)  06/2019   FORSYTH: small esophageal varices without high risk stigmata, single large AVM on lesser curvature in gastric body s/p APC ablation, gastric antral and duodenal bulb polyposis. No significant source to explain transfusion dependent anemia. Colonoscopy with portal colopathy and diffuse edema, changes of Cdiff colitis on colonoscopy   ESOPHAGEAL BANDING N/A 02/03/2018   Procedure: ESOPHAGEAL BANDING;  Surgeon: RDaneil Dolin MD;  [facility];  Service: Endoscopy;  Laterality: N/A;    ESOPHAGOGASTRODUODENOSCOPY  01/15/2005   Three columns grade 1 to 2 esophageal varices, otherwise normal esophageal mucosa.  Esophagus was not manipulated otherwise./Nodularity of the antrum with overlying erosions, nonspecific finding. Path showed rare H.pylori   ESOPHAGOGASTRODUODENOSCOPY  09/2008   Dr. RGaylene Brookscolumns of grade 2-3 esoph varices, only one column was prominent. Portal gastropathy, multiple gastrc polyps at antrum, two were 2cm with black eschar, bulbar polyps, bulbar erosions   ESOPHAGOGASTRODUODENOSCOPY  11/2004   Dr. FBrantley Stage3 esoph varices   ESOPHAGOGASTRODUODENOSCOPY  03/31/2012   RMR: 4 columns(3-GR 2, 1-GR1) non-bleeding esophageal varices, portal gastropathy, small HH, early GAVE, multiple gastric polyps    ESOPHAGOGASTRODUODENOSCOPY (EGD) WITH PROPOFOL N/A 02/03/2018   Dr. RGala Romney Esophageal varices, 3 columns grade 1-2.  Portal hypertensive gastropathy.  Multiple gastric polyps, biopsy consistent with hyperplastic.   ESOPHAGOGASTRODUODENOSCOPY (EGD) WITH PROPOFOL N/A 09/19/2019   Fields: grade I and II esophageal varcies, mild portal hypertensive gastropathy, moderate gastritis but no H. pylori, single hyperplastic gastric polyp removed, obvious source for melena/transfusion dependent anemia not identified, may  be due to friable gastric and duodenal mucosa in the setting of portal hypertension   GASTRIC VARICES BANDING  03/31/2012   Procedure: GASTRIC VARICES BANDING;  Surgeon: Daneil Dolin, MD;  [facility];  Service: Endoscopy;  Laterality: N/A;   GIVENS CAPSULE STUDY N/A 09/21/2019   Poor study with a lot of debris obstructing much of the view of the first approximate 3 hours out of 6 hours.  No obvious source of bleeding identified.  Sequela of bleeding and old blood seen in the form of"whisps" of blood, blood flecks mostly toward the end of the study.   IR REMOVAL TUN CV CATH W/O FL  02/15/2020   POLYPECTOMY  09/19/2019   Procedure: POLYPECTOMY;   Surgeon: Danie Binder, MD;  [facility];  Service: Endoscopy;;   UMBILICAL HERNIA REPAIR  2017   exploratory laparotomy, abdominal washout     SOCIAL HISTORY:  Social History   Socioeconomic History   Marital status: Married    Spouse name: Not on file   Number of children: 2   Years of education: Not on file   Highest education level: Not on file  Occupational History   Occupation: RETIRED    Employer: SELF EMPLOYED  Tobacco Use   Smoking status: Former    Packs/day: 2.00    Years: 25.00    Pack years: 50.00    Types: Cigarettes    Quit date: 01/28/1986    Years since quitting: 34.8   Smokeless tobacco: Never  Vaping Use   Vaping Use: Never used  Substance and Sexual Activity   Alcohol use: No    Comment: quit in 05/2013   Drug use: No   Sexual activity: Not Currently  Other Topics Concern   Not on file  Social History Narrative   Two step children from second marriage (divorced) who live with him along with some grandchildren.    Social Determinants of Health   Financial Resource Strain: Not on file  Food Insecurity: No Food Insecurity   Worried About Charity fundraiser in the Last Year: Never true   Ran Out of Food in the Last Year: Never true  Transportation Needs: No Transportation Needs   Lack of Transportation (Medical): No   Lack of Transportation (Non-Medical): No  Physical Activity: Inactive   Days of Exercise per Week: 0 days   Minutes of Exercise per Session: 0 min  Stress: Not on file  Social Connections: Not on file  Intimate Partner Violence: Not At Risk   Fear of Current or Ex-Partner: No   Emotionally Abused: No   Physically Abused: No   Sexually Abused: No    FAMILY HISTORY:  Family History  Problem Relation Age of Onset   Colon cancer Father 75       deceased age 77   Breast cancer Sister        deceased   Diabetes Sister    Stroke Mother    Healthy Son    Healthy Daughter    Lung cancer Neg Hx    Ovarian cancer  Neg Hx     CURRENT MEDICATIONS:  Outpatient Encounter Medications as of 11/21/2020  Medication Sig   acetaminophen (TYLENOL) 500 MG tablet Take 1,000 mg by mouth as needed for mild pain.   Alcohol Swabs (ALCOHOL PREP) 70 % PADS Apply 1 each topically 3 (three) times daily.   ALPRAZolam (XANAX) 0.5 MG tablet Take 0.5 mg by mouth daily as needed.  Ascorbic Acid (VITAMIN C) 1000 MG tablet Take 1,000 mg by mouth 2 (two) times daily.   calcium carbonate (TUMS - DOSED IN MG ELEMENTAL CALCIUM) 500 MG chewable tablet Chew 1 tablet by mouth 3 (three) times daily with meals.   Calcium Carbonate-Simethicone (ALKA-SELTZER HEARTBURN + GAS) 750-80 MG CHEW Chew by mouth as needed.   diltiazem (TIAZAC) 120 MG 24 hr capsule Take 120 mg by mouth daily.   furosemide (LASIX) 40 MG tablet Take 80 mg by mouth daily.   gabapentin (NEURONTIN) 100 MG capsule TAKE 1 CAPSULE BY MOUTH AT BEDTIME   hydrocortisone (ANUSOL-HC) 2.5 % rectal cream Place 1 application rectally 2 (two) times daily. (Patient taking differently: Place 1 application rectally 2 (two) times daily as needed for hemorrhoids or anal itching.)   hydrOXYzine (VISTARIL) 25 MG capsule Take 25 mg by mouth 3 (three) times daily as needed for itching.    Insulin Degludec (TRESIBA FLEXTOUCH [place]) Inject into the skin.   insulin degludec (TRESIBA FLEXTOUCH) 200 UNIT/ML FlexTouch Pen Inject 120 Units into the skin at bedtime. When blood sugars are up (Patient taking differently: Inject 120 Units into the skin at bedtime.)   iron polysaccharides (NIFEREX) 150 MG capsule Take 150 mg by mouth at bedtime.    lactulose (CONSTULOSE) 10 GM/15ML solution TAKE 45 MLS EVERY MORNING AND TAKE 45 MLS EVERY EVENING (Patient taking differently: TAKE 45 MLS EVERY MORNING AND TAKE 30-45 MLS EVERY EVENING)   Lancet Devices (ADJUSTABLE LANCING DEVICE) MISC Apply 1 each topically daily.   levofloxacin (LEVAQUIN) 500 MG tablet Take 500 mg 3 times a week after dialysis.   metoprolol  succinate (TOPROL-XL) 25 MG 24 hr tablet Take 1 tablet (25 mg total) by mouth daily. Take at bedtime  on Monday ,Wednesday and Friday due to dialaylisis (Patient taking differently: Take 25 mg by mouth See admin instructions. Take 25 mg by mouth at bedtime every night and 25 mg in morning on  Sun, Tues, Thurs and Sat)   midodrine (PROAMATINE) 10 MG tablet Take 1 tablet (10 mg total) by mouth 2 (two) times daily with a meal.   milk thistle 175 MG tablet Take 175 mg by mouth 3 (three) times daily.    mirtazapine (REMERON) 30 MG tablet Take 1 tablet (30 mg total) by mouth at bedtime.   Multiple Vitamin (MULTIVITAMIN WITH MINERALS) TABS tablet Take 1 tablet by mouth in the morning.    NOVOFINE 32G X 6 MM MISC Inject into the skin.   nystatin (MYCOSTATIN/NYSTOP) powder Apply topically 3 (three) times daily. Into groin area and affected area (Patient taking differently: Apply topically as needed. Into groin area and affected area)   Omega-3 1000 MG CAPS  ( 200 mg ), bid, # 120   omega-3 acid ethyl esters (LOVAZA) 1 g capsule Take 2 g by mouth 2 (two) times daily.    omeprazole (PRILOSEC) 40 MG capsule Take 40 mg by mouth daily.   ondansetron (ZOFRAN) 4 MG tablet Take 4 mg by mouth as needed for nausea or vomiting.   oxyCODONE-acetaminophen (PERCOCET/ROXICET) 5-325 MG tablet Take 1 tablet by mouth every 4 (four) hours as needed for moderate pain.   Ped Vitamins ACD Fl-Iron (TRI-VIT/FLUORIDE/IRON PO)  daily   polyethylene glycol-electrolytes (TRILYTE) 420 g solution Take 4,000 mLs by mouth as directed.   pravastatin (PRAVACHOL) 20 MG tablet Take 20 mg by mouth in the morning.    Probiotic Product (PROBIOTIC DAILY PO) Take 1 tablet by mouth in the morning.  spironolactone (ALDACTONE) 50 MG tablet Take 50 mg by mouth in the morning.    tamsulosin (FLOMAX) 0.4 MG CAPS capsule Take 0.4 mg by mouth every evening.   thiamine (VITAMIN B-1) 100 MG tablet Take 1 tablet by mouth daily.   tiZANidine (ZANAFLEX) 2  MG tablet Take 2 mg by mouth 2 (two) times daily as needed for muscle spasms.    triamcinolone cream (KENALOG) 0.1 % Apply 1 application topically 2 (two) times daily as needed (dry skin Itching).    venlafaxine (EFFEXOR) 75 MG tablet Take 75 mg by mouth 3 (three) times daily with meals.    XIFAXAN 550 MG TABS tablet TAKE 1 TABLET BY MOUTH TWICE DAILY (Patient taking differently: Take 550 mg by mouth 2 (two) times daily.)   No facility-administered encounter medications on file as of 11/21/2020.    ALLERGIES:  Allergies  Allergen Reactions   Tape Itching and Rash     PHYSICAL EXAM:  ECOG PERFORMANCE STATUS: 2 - Symptomatic, <50% confined to bed  There were no vitals filed for this visit. There were no vitals filed for this visit. Physical Exam Constitutional:      Appearance: Normal appearance. He is obese.  HENT:     Head: Normocephalic and atraumatic.     Mouth/Throat:     Mouth: Mucous membranes are moist.  Eyes:     Extraocular Movements: Extraocular movements intact.     Pupils: Pupils are equal, round, and reactive to light.  Cardiovascular:     Rate and Rhythm: Normal rate and regular rhythm.     Pulses: Normal pulses.     Heart sounds: Normal heart sounds.  Pulmonary:     Effort: Pulmonary effort is normal.     Breath sounds: Examination of the right-lower field reveals decreased breath sounds. Examination of the left-lower field reveals decreased breath sounds. Decreased breath sounds present.  Abdominal:     General: Bowel sounds are normal.     Palpations: Abdomen is soft.     Tenderness: There is no abdominal tenderness.  Musculoskeletal:        General: No swelling.     Right lower leg: Edema (3+, tight) present.     Left lower leg: Edema (3+, tight) present.  Lymphadenopathy:     Cervical: No cervical adenopathy.  Skin:    General: Skin is warm and dry.  Neurological:     General: No focal deficit present.     Mental Status: He is alert and oriented to  person, place, and time.  Psychiatric:        Mood and Affect: Mood normal.        Behavior: Behavior normal.     LABORATORY DATA:  I have reviewed the labs as listed.  CBC    Component Value Date/Time   WBC 5.0 11/19/2020 1053   RBC 2.26 (L) 11/19/2020 1053   HGB 7.0 (L) 11/19/2020 1053   HCT 23.3 (L) 11/19/2020 1053   HCT 28 02/25/2012 1034   PLT 84 (L) 11/19/2020 1053   PLT 125 02/25/2012 1034   MCV 103.1 (H) 11/19/2020 1053   MCV 82.0 02/25/2012 1034   MCH 31.0 11/19/2020 1053   MCHC 30.0 11/19/2020 1053   RDW 16.4 (H) 11/19/2020 1053   LYMPHSABS 0.3 (L) 11/19/2020 1053   MONOABS 0.3 11/19/2020 1053   EOSABS 0.3 11/19/2020 1053   BASOSABS 0.0 11/19/2020 1053   CMP Latest Ref Rng & Units 11/19/2020 11/07/2020 09/19/2020  Glucose 70 - 99  mg/dL 194(H) 149(H) -  BUN 8 - 23 mg/dL 25(H) 23 -  Creatinine 0.61 - 1.24 mg/dL 2.81(H) 2.56(H) -  Sodium 135 - 145 mmol/L 130(L) 131(L) -  Potassium 3.5 - 5.1 mmol/L 4.4 4.1 -  Chloride 98 - 111 mmol/L 94(L) 95(L) -  CO2 22 - 32 mmol/L 27 28 -  Calcium 8.9 - 10.3 mg/dL 8.2(L) 8.4(L) 8.3(L)  Total Protein 6.5 - 8.1 g/dL 6.2(L) 6.6 -  Total Bilirubin 0.3 - 1.2 mg/dL 0.7 0.7 -  Alkaline Phos 38 - 126 U/L 193(H) 202(H) -  AST 15 - 41 U/L 21 23 -  ALT 0 - 44 U/L 17 19 -    DIAGNOSTIC IMAGING:  I have independently reviewed the relevant imaging and discussed with the patient.  ASSESSMENT & PLAN: 1.  Pancytopenia of mixed etiology - Anemia secondary to ESRD, cirrhosis, and blood loss - Leukopenia and thrombocytopenia secondary to splenomegaly - Most recent labs (11/19/2020): Hgb 7.0/MCV 103.1, platelets 84, WBC 5.0 - MDS has not yet been investigated or ruled out via bone marrow biopsy, as there would be no change in the patient's treatment plan regardless of results.  Due to his overall fragile state of health with poor prognosis, supportive care remains his best treatment option; he would not be a candidate for any chemotherapeutic  options. - PLAN: We will defer bone marrow biopsy at this time.  Further plan as below.  2.  Normocytic anemia, transfusion-dependent - Anemia is related to his ESRD (receives Epogen at dialysis), end-stage liver disease/cirrhosis, and intermittent GI bleeding.  He is now transfusion dependent, and has been receiving PRBC transfusions on average every 4 weeks. - Intermittent melena with Hemoccult positive stool and extensive work-up for gastrointestinal blood loss: EGD (09/19/2019): Grade 1 and grade 2 esophageal varices without stigmata of recent bleeding, mild portal hypertensive gastropathy, moderate gastritis, gastric polyp - " no obvious source for melena and transfusion dependent anemia identified, may be due to friable gastric and duodenal mucosa in the setting of portal hypertension" Capsule endoscopy in April 2021 was unrevealing Most recent colonoscopy (11/29/2015): Mild diverticulosis, polyp x1 Scheduled for upcoming EGD/colonoscopy in August 2022 Stool occult blood positive x3 (08/15/2020) - Work-up included SPEP and immunofixation is negative. Hemolysis labs were negative with normal LDH and reticulocyte count. - Most recent IV iron (Venofer) February 2022 - Receives Epogen at dialysis - Most recent labs (11/19/2020) Hgb 7.0/MCV 103.1, ferritin 156, iron saturation 12%, serum iron 33 - PLAN: Transfuse PRBC x1.  IV Venofer 300 mg x 3.  CBC with sample to blood bank every 4 weeks.  PRBC transfusion every 4 weeks for Hgb < 7.0 (or Hgb <8.0 with symptoms).  RTC in 8 weeks with full lab panel before visit.  3.  Thrombocytopenia and leukopenia -Thrombocytopenia and leukopenia due to are thought to be secondary to his chronic liver disease and splenomegaly. -CTAP on 04/01/2020 shows persistent splenomegaly - Most recent labs (11/19/2020) with platelets 84, WBC 5.0 (improved from previous) - Patient denies frequent infections.  Reports easy bruising but denies bleeding. - PLAN: Repeat CBC every  4 weeks.  RTC in 8 weeks.  4.  B12 deficiency - Anemia mildly macrocytic -B12 deficiency (09/19/2020) with elevated methylmalonic acid 662 despite normal serum B12 of 635 - Has been taking oral B12 but without improvement in labs - Folate checked on 11/23/2019 was normal at 16.0 - PLAN: Stop oral cyanocobalamin.  We will start monthly B12 injections.  Check B12, methylmalonic acid, folate  in 8 weeks.  5.  End-stage renal disease: -He gets dialysis Monday-Wednesday-Friday -He receives Epogen injections at his dialysis.  6. Cirrhosis / End-stage Liver Disease - Receives paracentesis every 2 weeks - Follows regularly with gastroenterology    PLAN SUMMARY & DISPOSITION: - IV Venofer 300 mg x 3. - PRBC transfusion x1. - Repeat CBC in 4 weeks with sample to blood bank for possible transfusion. - Start B12 injections monthly, discontinue B12 oral supplementation. - RTC in 8 weeks with repeat labs (CBC, iron/ferritin, B12/MMA, folate) and office visit.   All questions were answered. The patient knows to call the clinic with any problems, questions or concerns.  Medical decision making: Moderate  Time spent on visit: I spent 30 minutes counseling the patient face to face. The total time spent in the appointment was 40 minutes and more than 50% was on counseling.  Harriett Rush, PA-C  11/21/20 5:18 PM

## 2020-11-21 ENCOUNTER — Other Ambulatory Visit: Payer: Medicare Other | Admitting: *Deleted

## 2020-11-21 ENCOUNTER — Ambulatory Visit (HOSPITAL_COMMUNITY): Payer: Medicare Other | Admitting: Hematology

## 2020-11-21 ENCOUNTER — Other Ambulatory Visit: Payer: Self-pay

## 2020-11-21 ENCOUNTER — Inpatient Hospital Stay (HOSPITAL_BASED_OUTPATIENT_CLINIC_OR_DEPARTMENT_OTHER): Payer: Medicare Other | Admitting: Physician Assistant

## 2020-11-21 ENCOUNTER — Encounter (HOSPITAL_COMMUNITY): Payer: Self-pay | Admitting: Physician Assistant

## 2020-11-21 VITALS — BP 129/56 | HR 81 | Temp 97.9°F | Resp 18 | Wt 222.0 lb

## 2020-11-21 DIAGNOSIS — E538 Deficiency of other specified B group vitamins: Secondary | ICD-10-CM

## 2020-11-21 DIAGNOSIS — D500 Iron deficiency anemia secondary to blood loss (chronic): Secondary | ICD-10-CM | POA: Diagnosis not present

## 2020-11-21 DIAGNOSIS — D509 Iron deficiency anemia, unspecified: Secondary | ICD-10-CM | POA: Diagnosis not present

## 2020-11-21 HISTORY — DX: Deficiency of other specified B group vitamins: E53.8

## 2020-11-21 NOTE — Patient Instructions (Signed)
[place] at [facility] Discharge Instructions  You were seen today by Tarri Abernethy PA-C for your anemia, low platelets, and low white blood cells.    Your blood counts are still low, so we will give you a blood transfusion next week.    Your iron is low, so we will set you up for some iron transfusions.  Your B12 is low (but you are not absorbing the pills), so we will start giving you B12 injections once a month.  We will check your blood every 4 weeks and give you blood if needed.  LABS: Return in 4 weeks for labs.   FOLLOW-UP APPOINTMENT: Office visit in 8 weeks.   Thank you for choosing [place] at [facility] to provide your oncology and hematology care.  To afford each patient quality time with our provider, please arrive at least 15 minutes before your scheduled appointment time.   If you have a lab appointment with the [facility] please come in thru the Main Entrance and check in at the main information desk.  You need to re-schedule your appointment should you arrive 10 or more minutes late.  We strive to give you quality time with our providers, and arriving late affects you and other patients whose appointments are after yours.  Also, if you no show three or more times for appointments you may be dismissed from the clinic at the providers discretion.     Again, thank you for choosing [facility].  Our hope is that these requests will decrease the amount of time that you wait before being seen by our physicians.       _____________________________________________________________  Should you have questions after your visit to [facility], please contact our office at [phone] and follow the prompts.  Our office hours are 8:00 a.m. and 4:30 p.m. Monday - Friday.  Please note that voicemails left after 4:00 p.m. may not be returned until the following business day.  We are closed weekends and  major holidays.  You do have access to a nurse 24-7, just call the main number to the clinic [phone] and do not press any options, hold on the line and a nurse will answer the phone.    For prescription refill requests, have your pharmacy contact our office and allow 72 hours.    Due to Covid, you will need to wear a mask upon entering the hospital. If you do not have a mask, a mask will be given to you at the Main Entrance upon arrival. For doctor visits, patients may have 1 support person age 41 or older with them. For treatment visits, patients can not have anyone with them due to social distancing guidelines and our immunocompromised population.

## 2020-11-21 NOTE — Patient Outreach (Signed)
[facility]) Care Management  11/21/2020  OZ GAMMEL 01-20-1946 [id number]   Telephone Assessment-Successful- Diabetes  RN spoke with [name] (Spouse) and received an update on pt's ongoing care. Spouse reports pt continue to have some ongoing issues related to stabilization and reports all previous labs were abnormal and pt will under go additional labs on Monday then on Tuesday for PRBCs. States pt continues his weekly dialysis with no needed refills. Pt was thoroughly educated on some of his ongoing issues more in-dept with a previous visit to a R. Leron Croak, PA at [facility]. Spouse spoke very highly of this provider indicating pt had a clearer understanding of some of his medical issues.   Plan care reviewed and discussed with noted updates on goals and interventions. Will follow up next month on pt's ongoing management of care and remain available to assist with any ongoing needs.   Goals Addressed             This Visit's Progress    THN-Disease Progression Prevented or Minimized   On track    Follow up date: 12/20/2020 Timeframe:  Short-Term Goal Priority:  Medium Start Date:     07/23/2020                        Expected End Date:  02/28/2021                      Evidence-based guidance:   Prepare patient for laboratory tests, such as lipid profile, microalbuminuria, estimated glomerular filtration rate and diagnostic exams based on risk and presentation.  Assess signs/symptoms and risk factors for hypertension, dyslipidemia, nephropathy, neuropathy and retinopathy.  Encourage lifestyle changes, such as increased intake of plant-based foods, stress reduction, regular physical activity and smoking cessation to prevent long-term complications and chronic diseases.  Discourage sedentary behavior or sitting longer than 30 minutes without interruption; individualize exercise or activity recommendations with potential limitations.    Implement additional  individualized goals and interventions based on identified risk factors.  Prepare patient for consultation or referral for specialist care, such as ophthalmology, neurology, cardiology, podiatry, nephrology or perinatology. All that imply related to pt's medical history.   Barriers: Health Behaviors  Notes: 6/23-Continue to verify pt continues to do will with no acute issues or needs at this time.Will continue to encouraged adherence through her primary caregiver [name]. 5/23-Verified pt continue weekly dialysis with no missed appointments (history) and has attended all medical appointments with his last PCP on 5/19 [name]. No major issues or acute needs at this time as pt and caregiver continue to manager the pt's care very well. Reports last A1C at 6.5 in April. Will continue praise all efforts in managing the pt's diabetes with the current management of care exhibited. No additional visits reported vi [facility] and pt continue to tolerate dialysis without acute issues. 4/19-Spouse reports pt continue HD 3 X weekly with no missed appointments. Pt continue to attend all medical appointments with sufficient transportation. No medication changes as pt remain adherent to taking all his medications. Pending appointments discussed with Oncologist on 4/21 and [name] for labs on 4/26. Pt had two falls several weeks ago with some bruises that are healing to his knees and toes. Verified pt did not have a head injury and did not require hospital or ED visit. Encouraged ongoing use of his DME RW for safety precaution.  3/22-Pt active with his ongoing HD 3 X  weekly and attendance to all appointments.Visit to [facility] today for fluid retention (usually 1 liter removed). Pt goes every few weeks due to ongoing swelling to his LE.  Pt hx of anemia with Hgb 7 (being monitored). Spouse ([name]) feel pt may benefit from additional HHPT. Encouraged to make this request with [name] (receptive and she will  inquire). Currently pt is using a RW unable to maintain stability with a cane at this time. Reports AM glucose at 140 and maintaining this average. Will continue to encouraged adherence and praised pt for his efforts in maintaining his health and adhering to all goals and interventions.  2/22-Will discuss all above and verified pt's has seen both his ophthalmologist and podiatry over the last year. Pt has been hospitalized with this years appointments and will rescheduled for his annuals. Stress the importance of completing this task. Verified no symptoms of hyper/hypoglycemia have occurred over the last month since the last conversation. Reports last Ac1 6.0 last week at the providers office and the last CBG read at 200. Aware this is elevated and RN case manager discuss ways to reduce this number. Pt has been lower in the past and has some stressors going on at this time.      THN-Learn more abont my health   On track    Follow Up Date 12/20/2020 Timeframe:  Long-Range Goal Priority:  Medium Start Date:    06/20/2020                         Expected End Date:    02/28/2021                   - make a list of questions - ask questions - repeat what I heard to make sure I understand - bring a list of my medicines to the visit - speak up when I don't understand   Barriers: Health Behaviors  Why is this important?   The best way to learn about your health and care is by talking to the doctor and nurse.  They will answer your questions and give you information in the way that you like best.    Notes:  6/23: Wife continues to be educated to assist pt with knowing more about pt's ongoing medical issues. Wife reports pt more aware of his medical issues and treatment options.  4/19- Will continue to discussed long term goals and interventions related to understanding of all his medications and all his medical conditions that require daily monitoring.                 1/20-Mange your diet, keep all  appointments for diease related illness, follow all recommendations for treatment management and always utilize prevention measures to avoid acute encounters.  Will verbalize pt's understanding and verify with teach-back method.      THN-Make and Keep All Appointments   On track    Follow Up Date : 12/20/2020 Timeframe:  Long-Range Goal Priority:  Medium Start Date:     05/23/2020                        Expected End Date:        12/27/2020                 - call to cancel if needed - keep a calendar with prescription refill dates - keep a calendar with appointment dates  Barriers: Health Behaviors  Why is this important?   Part of staying healthy is seeing the doctor for follow-up care.  If you forget your appointments, there are some things you can do to stay on track.    Notes:  4/19 Will extend this goal to allow ongoing adherence as pt has missed some HD appointments over the last 3 months. Continue to stress the importance of pt attending all his medical appointments to avoid "no show" fees and acute events from happending related to missed HD appointments.  1/20-Spouse reports several appointments were rescheduled due to incremental weather but verified. Pt missed two dialysis appointment however was able to re-stabilize fluid removed via dialysis on another day. Will strongly encourage adherence with all medical appointments.  12/21-Verified pt continue to remain adherent with all dialysis and medical appointments scheduled. Will extend to allow ongoing adherence.          Raina Mina, RN Care Management Coordinator [place] Office [phone]

## 2020-11-22 ENCOUNTER — Encounter (HOSPITAL_COMMUNITY): Payer: Medicare Other

## 2020-11-24 LAB — CULTURE, BODY FLUID W GRAM STAIN -BOTTLE: Culture: NO GROWTH

## 2020-11-25 ENCOUNTER — Other Ambulatory Visit: Payer: Self-pay

## 2020-11-25 ENCOUNTER — Inpatient Hospital Stay (HOSPITAL_COMMUNITY): Payer: Medicare Other

## 2020-11-25 ENCOUNTER — Other Ambulatory Visit: Payer: Self-pay | Admitting: Internal Medicine

## 2020-11-25 DIAGNOSIS — R188 Other ascites: Secondary | ICD-10-CM

## 2020-11-25 DIAGNOSIS — D509 Iron deficiency anemia, unspecified: Secondary | ICD-10-CM | POA: Diagnosis not present

## 2020-11-25 DIAGNOSIS — D500 Iron deficiency anemia secondary to blood loss (chronic): Secondary | ICD-10-CM

## 2020-11-25 LAB — CBC WITH DIFFERENTIAL/PLATELET
Abs Immature Granulocytes: 0.01 10*3/uL (ref 0.00–0.07)
Basophils Absolute: 0 10*3/uL (ref 0.0–0.1)
Basophils Relative: 1 %
Eosinophils Absolute: 0.4 10*3/uL (ref 0.0–0.5)
Eosinophils Relative: 10 %
HCT: 24.5 % — ABNORMAL LOW (ref 39.0–52.0)
Hemoglobin: 7.5 g/dL — ABNORMAL LOW (ref 13.0–17.0)
Immature Granulocytes: 0 %
Lymphocytes Relative: 8 %
Lymphs Abs: 0.3 10*3/uL — ABNORMAL LOW (ref 0.7–4.0)
MCH: 31 pg (ref 26.0–34.0)
MCHC: 30.6 g/dL (ref 30.0–36.0)
MCV: 101.2 fL — ABNORMAL HIGH (ref 80.0–100.0)
Monocytes Absolute: 0.3 10*3/uL (ref 0.1–1.0)
Monocytes Relative: 8 %
Neutro Abs: 2.7 10*3/uL (ref 1.7–7.7)
Neutrophils Relative %: 73 %
Platelets: 92 10*3/uL — ABNORMAL LOW (ref 150–400)
RBC: 2.42 MIL/uL — ABNORMAL LOW (ref 4.22–5.81)
RDW: 15.9 % — ABNORMAL HIGH (ref 11.5–15.5)
WBC: 3.7 10*3/uL — ABNORMAL LOW (ref 4.0–10.5)
nRBC: 0 % (ref 0.0–0.2)

## 2020-11-25 LAB — SAMPLE TO BLOOD BANK

## 2020-11-25 LAB — PREPARE RBC (CROSSMATCH)

## 2020-11-26 ENCOUNTER — Inpatient Hospital Stay (HOSPITAL_COMMUNITY): Payer: Medicare Other

## 2020-11-26 ENCOUNTER — Encounter (HOSPITAL_COMMUNITY): Payer: Self-pay

## 2020-11-26 VITALS — BP 139/52 | HR 89 | Temp 97.2°F | Resp 18

## 2020-11-26 DIAGNOSIS — E538 Deficiency of other specified B group vitamins: Secondary | ICD-10-CM

## 2020-11-26 DIAGNOSIS — D509 Iron deficiency anemia, unspecified: Secondary | ICD-10-CM | POA: Diagnosis not present

## 2020-11-26 DIAGNOSIS — D500 Iron deficiency anemia secondary to blood loss (chronic): Secondary | ICD-10-CM

## 2020-11-26 MED ORDER — SODIUM CHLORIDE 0.9% IV SOLUTION
250.0000 mL | Freq: Once | INTRAVENOUS | Status: AC
  Administered 2020-11-26: 250 mL via INTRAVENOUS

## 2020-11-26 MED ORDER — CYANOCOBALAMIN 1000 MCG/ML IJ SOLN
INTRAMUSCULAR | Status: AC
  Filled 2020-11-26: qty 1

## 2020-11-26 MED ORDER — DIPHENHYDRAMINE HCL 25 MG PO CAPS
ORAL_CAPSULE | ORAL | Status: AC
  Filled 2020-11-26: qty 1

## 2020-11-26 MED ORDER — ACETAMINOPHEN 325 MG PO TABS
ORAL_TABLET | ORAL | Status: AC
  Filled 2020-11-26: qty 2

## 2020-11-26 MED ORDER — CYANOCOBALAMIN 1000 MCG/ML IJ SOLN
1000.0000 ug | Freq: Once | INTRAMUSCULAR | Status: AC
  Administered 2020-11-26: 1000 ug via INTRAMUSCULAR

## 2020-11-26 MED ORDER — DIPHENHYDRAMINE HCL 25 MG PO CAPS
25.0000 mg | ORAL_CAPSULE | Freq: Once | ORAL | Status: AC
  Administered 2020-11-26: 25 mg via ORAL

## 2020-11-26 MED ORDER — ACETAMINOPHEN 325 MG PO TABS
650.0000 mg | ORAL_TABLET | Freq: Once | ORAL | Status: AC
  Administered 2020-11-26: 650 mg via ORAL

## 2020-11-26 NOTE — Progress Notes (Signed)
Patient presents today for 1 unit of PRBC's. Vital signs stable. Patient has no complaints of any changes since his last visit. MAR reviewed and updated.    Blood products given today per MD orders. Tolerated infusion without adverse affects. Vital signs stable. No complaints at this time. Discharged from clinic by wheel chair and accompanied by his wife in stable condition. Alert and oriented x 3. F/U with [facility] as scheduled.

## 2020-11-26 NOTE — Patient Instructions (Signed)
[facility]  Discharge Instructions: Thank you for choosing [facility] to provide your oncology and hematology care.  If you have a lab appointment with the [facility], please come in thru the Main Entrance and check in at the main information desk.  Wear comfortable clothing and clothing appropriate for easy access to any Portacath or PICC line.   We strive to give you quality time with your provider. You may need to reschedule your appointment if you arrive late (15 or more minutes).  Arriving late affects you and other patients whose appointments are after yours.  Also, if you miss three or more appointments without notifying the office, you may be dismissed from the clinic at the provider's discretion.      For prescription refill requests, have your pharmacy contact our office and allow 72 hours for refills to be completed.    Today you received 1 Unit of PRBC's and a B12 injection.        BELOW ARE SYMPTOMS THAT SHOULD BE REPORTED IMMEDIATELY: *FEVER GREATER THAN 100.4 F (38 C) OR HIGHER *CHILLS OR SWEATING *NAUSEA AND VOMITING THAT IS NOT CONTROLLED WITH YOUR NAUSEA MEDICATION *UNUSUAL SHORTNESS OF BREATH *UNUSUAL BRUISING OR BLEEDING *URINARY PROBLEMS (pain or burning when urinating, or frequent urination) *BOWEL PROBLEMS (unusual diarrhea, constipation, pain near the anus) TENDERNESS IN MOUTH AND THROAT WITH OR WITHOUT PRESENCE OF ULCERS (sore throat, sores in mouth, or a toothache) UNUSUAL RASH, SWELLING OR PAIN  UNUSUAL VAGINAL DISCHARGE OR ITCHING   Items with * indicate a potential emergency and should be followed up as soon as possible or go to the Emergency Department if any problems should occur.  Please show the CHEMOTHERAPY ALERT CARD or IMMUNOTHERAPY ALERT CARD at check-in to the Emergency Department and triage nurse.  Should you have questions after your visit or need to cancel or reschedule your appointment, please contact [facility] [phone]  and follow the prompts.  Office hours are 8:00 a.m. to 4:30 p.m. Monday - Friday. Please note that voicemails left after 4:00 p.m. may not be returned until the following business day.  We are closed weekends and major holidays. You have access to a nurse at all times for urgent questions. Please call the main number to the clinic [phone] and follow the prompts.  For any non-urgent questions, you may also contact your provider using MyChart. We now offer e-Visits for anyone 59 and older to request care online for non-urgent symptoms. For details visit [email or web address].   Also download the MyChart app! Go to the app store, search "MyChart", open the app, select [facility], and log in with your MyChart username and password.  Due to Covid, a mask is required upon entering the hospital/clinic. If you do not have a mask, one will be given to you upon arrival. For doctor visits, patients may have 1 support person aged 53 or older with them. For treatment visits, patients cannot have anyone with them due to current Covid guidelines and our immunocompromised population.

## 2020-11-27 LAB — TYPE AND SCREEN
ABO/RH(D): O POS
Antibody Screen: NEGATIVE
Unit division: 0

## 2020-11-27 LAB — BPAM RBC
Blood Product Expiration Date: 202208012359
ISSUE DATE / TIME: 202206280919
Unit Type and Rh: 5100

## 2020-11-28 ENCOUNTER — Encounter (HOSPITAL_COMMUNITY): Payer: Self-pay

## 2020-11-28 ENCOUNTER — Inpatient Hospital Stay (HOSPITAL_COMMUNITY): Payer: Medicare Other

## 2020-11-28 ENCOUNTER — Other Ambulatory Visit: Payer: Self-pay

## 2020-11-28 VITALS — BP 134/49 | HR 84 | Temp 97.6°F | Resp 18

## 2020-11-28 DIAGNOSIS — D509 Iron deficiency anemia, unspecified: Secondary | ICD-10-CM | POA: Diagnosis not present

## 2020-11-28 DIAGNOSIS — E538 Deficiency of other specified B group vitamins: Secondary | ICD-10-CM

## 2020-11-28 MED ORDER — SODIUM CHLORIDE 0.9 % IV SOLN: Freq: Once | INTRAVENOUS | Status: AC

## 2020-11-28 MED ORDER — SODIUM CHLORIDE 0.9 % IV SOLN
300.0000 mg | Freq: Once | INTRAVENOUS | Status: AC
  Administered 2020-11-28: 300 mg via INTRAVENOUS
  Filled 2020-11-28: qty 300

## 2020-11-28 NOTE — Patient Instructions (Signed)
[place]  Discharge Instructions: Thank you for choosing [facility] to provide your oncology and hematology care.  If you have a lab appointment with the [facility], please come in thru the Main Entrance and check in at the main information desk.  Wear comfortable clothing and clothing appropriate for easy access to any Portacath or PICC line.   We strive to give you quality time with your provider. You may need to reschedule your appointment if you arrive late (15 or more minutes).  Arriving late affects you and other patients whose appointments are after yours.  Also, if you miss three or more appointments without notifying the office, you may be dismissed from the clinic at the provider's discretion.      For prescription refill requests, have your pharmacy contact our office and allow 72 hours for refills to be completed.    Today you received the following Venofer infusion     BELOW ARE SYMPTOMS THAT SHOULD BE REPORTED IMMEDIATELY: *FEVER GREATER THAN 100.4 F (38 C) OR HIGHER *CHILLS OR SWEATING *NAUSEA AND VOMITING THAT IS NOT CONTROLLED WITH YOUR NAUSEA MEDICATION *UNUSUAL SHORTNESS OF BREATH *UNUSUAL BRUISING OR BLEEDING *URINARY PROBLEMS (pain or burning when urinating, or frequent urination) *BOWEL PROBLEMS (unusual diarrhea, constipation, pain near the anus) TENDERNESS IN MOUTH AND THROAT WITH OR WITHOUT PRESENCE OF ULCERS (sore throat, sores in mouth, or a toothache) UNUSUAL RASH, SWELLING OR PAIN  UNUSUAL VAGINAL DISCHARGE OR ITCHING   Items with * indicate a potential emergency and should be followed up as soon as possible or go to the Emergency Department if any problems should occur.  Please show the CHEMOTHERAPY ALERT CARD or IMMUNOTHERAPY ALERT CARD at check-in to the Emergency Department and triage nurse.  Should you have questions after your visit or need to cancel or reschedule your appointment, please contact [facility] [phone]  and follow the prompts.  Office hours are 8:00 a.m. to 4:30 p.m. Monday - Friday. Please note that voicemails left after 4:00 p.m. may not be returned until the following business day.  We are closed weekends and major holidays. You have access to a nurse at all times for urgent questions. Please call the main number to the clinic [phone] and follow the prompts.  For any non-urgent questions, you may also contact your provider using MyChart. We now offer e-Visits for anyone 1 and older to request care online for non-urgent symptoms. For details visit [email or web address].   Also download the MyChart app! Go to the app store, search "MyChart", open the app, select [facility], and log in with your MyChart username and password.  Due to Covid, a mask is required upon entering the hospital/clinic. If you do not have a mask, one will be given to you upon arrival. For doctor visits, patients may have 1 support person aged 21 or older with them. For treatment visits, patients cannot have anyone with them due to current Covid guidelines and our immunocompromised population.

## 2020-11-28 NOTE — Progress Notes (Signed)
Patient presents today for Venofer 300 mg IV infusion. Vital signs are stable. Patient denies any changes since the last iron infusion. Patient denies any complaints today. MAR reviewed and updated.  Venofer given today per MD orders. Tolerated infusion without adverse affects. Vital signs stable. No complaints at this time. Discharged from clinic via wheelchair in stable condition. Alert and oriented x 3. F/U with [facility] as scheduled.

## 2020-12-03 ENCOUNTER — Ambulatory Visit (HOSPITAL_COMMUNITY)
Admission: RE | Admit: 2020-12-03 | Discharge: 2020-12-03 | Disposition: A | Discharge status: Home / Self Care | Payer: Medicare Other | Source: Ambulatory Visit | Attending: Internal Medicine | Admitting: Internal Medicine

## 2020-12-03 ENCOUNTER — Encounter (HOSPITAL_COMMUNITY): Payer: Self-pay

## 2020-12-03 DIAGNOSIS — R188 Other ascites: Secondary | ICD-10-CM

## 2020-12-03 LAB — BODY FLUID CELL COUNT WITH DIFFERENTIAL
Eos, Fluid: 1 %
Lymphs, Fluid: 61 %
Monocyte-Macrophage-Serous Fluid: 29 % — ABNORMAL LOW (ref 50–90)
Neutrophil Count, Fluid: 9 % (ref 0–25)
Other Cells, Fluid: 4 %
Total Nucleated Cell Count, Fluid: 172 cu mm (ref 0–1000)

## 2020-12-03 LAB — GRAM STAIN

## 2020-12-03 NOTE — Sedation Documentation (Signed)
PT tolerated left sided paracentesis procedure well today and 3.7 Liters of clear yellow fluid removed with labs collected and sent for processing. PT vital signs remained stable at completion of paracentesis and NAD noted. PT verbalized understanding of discharge instructions and left via wheelchair with his wife from the waiting area at this time.

## 2020-12-03 NOTE — Procedures (Signed)
PreOperative Dx: Alcoholic cirrhosis, ascites Postoperative Dx: Alcoholic cirrhosis, ascites Procedure:   US guided paracentesis Radiologist:  Thornton Papas Anesthesia:  10 ml of1% lidocaine Specimen:  3.7 L of cloudy yellow ascitic fluid EBL:   < 1 ml Complications: None

## 2020-12-04 LAB — PATHOLOGIST SMEAR REVIEW

## 2020-12-05 ENCOUNTER — Other Ambulatory Visit: Payer: Self-pay

## 2020-12-05 ENCOUNTER — Inpatient Hospital Stay (HOSPITAL_COMMUNITY): Payer: Medicare Other | Attending: Hematology

## 2020-12-05 VITALS — BP 136/51 | HR 85 | Temp 96.8°F | Resp 18

## 2020-12-05 DIAGNOSIS — E538 Deficiency of other specified B group vitamins: Secondary | ICD-10-CM

## 2020-12-05 DIAGNOSIS — D509 Iron deficiency anemia, unspecified: Secondary | ICD-10-CM | POA: Diagnosis present

## 2020-12-05 MED ORDER — SODIUM CHLORIDE 0.9 % IV SOLN
Freq: Once | INTRAVENOUS | Status: AC
Start: 2020-12-05 — End: 2020-12-05

## 2020-12-05 MED ORDER — IRON SUCROSE 20 MG/ML IV SOLN
300.0000 mg | Freq: Once | INTRAVENOUS | Status: AC
  Administered 2020-12-05: 300 mg via INTRAVENOUS
  Filled 2020-12-05: qty 300

## 2020-12-05 NOTE — Patient Instructions (Signed)
[facility] [place]  Discharge Instructions: Thank you for choosing [facility] to provide your oncology and hematology care.  If you have a lab appointment with the [facility], please come in thru the Main Entrance and check in at the main information desk.  Wear comfortable clothing and clothing appropriate for easy access to any Portacath or PICC line.   We strive to give you quality time with your provider. You may need to reschedule your appointment if you arrive late (15 or more minutes).  Arriving late affects you and other patients whose appointments are after yours.  Also, if you miss three or more appointments without notifying the office, you may be dismissed from the clinic at the provider's discretion.      For prescription refill requests, have your pharmacy contact our office and allow 72 hours for refills to be completed.    Today you received the following chemotherapy and/or immunotherapy agents Venofer infusion      To help prevent nausea and vomiting after your treatment, we encourage you to take your nausea medication as directed.  BELOW ARE SYMPTOMS THAT SHOULD BE REPORTED IMMEDIATELY: *FEVER GREATER THAN 100.4 F (38 C) OR HIGHER *CHILLS OR SWEATING *NAUSEA AND VOMITING THAT IS NOT CONTROLLED WITH YOUR NAUSEA MEDICATION *UNUSUAL SHORTNESS OF BREATH *UNUSUAL BRUISING OR BLEEDING *URINARY PROBLEMS (pain or burning when urinating, or frequent urination) *BOWEL PROBLEMS (unusual diarrhea, constipation, pain near the anus) TENDERNESS IN MOUTH AND THROAT WITH OR WITHOUT PRESENCE OF ULCERS (sore throat, sores in mouth, or a toothache) UNUSUAL RASH, SWELLING OR PAIN  UNUSUAL VAGINAL DISCHARGE OR ITCHING   Items with * indicate a potential emergency and should be followed up as soon as possible or go to the Emergency Department if any problems should occur.  Please show the CHEMOTHERAPY ALERT CARD or IMMUNOTHERAPY ALERT CARD at check-in to the Emergency  Department and triage nurse.  Should you have questions after your visit or need to cancel or reschedule your appointment, please contact [facility] [phone]  and follow the prompts.  Office hours are 8:00 a.m. to 4:30 p.m. Monday - Friday. Please note that voicemails left after 4:00 p.m. may not be returned until the following business day.  We are closed weekends and major holidays. You have access to a nurse at all times for urgent questions. Please call the main number to the clinic [phone] and follow the prompts.  For any non-urgent questions, you may also contact your provider using MyChart. We now offer e-Visits for anyone 35 and older to request care online for non-urgent symptoms. For details visit [email or web address].   Also download the MyChart app! Go to the app store, search "MyChart", open the app, select [facility], and log in with your MyChart username and password.  Due to Covid, a mask is required upon entering the hospital/clinic. If you do not have a mask, one will be given to you upon arrival. For doctor visits, patients may have 1 support person aged 58 or older with them. For treatment visits, patients cannot have anyone with them due to current Covid guidelines and our immunocompromised population.

## 2020-12-05 NOTE — Progress Notes (Signed)
Patient presents today for 300 mg Venofer infusion per provider orders.  Vital signs WNL.  Patient has no new complaints at this time.  Peripheral IV started and blood return noted pre and post infusion.  Venofer infusion given today per MD orders.  Stable during infusion without adverse affects.  Vital signs stable.  No complaints at this time.  Discharge from clinic via wheelchair in stable condition.  Alert and oriented X 3.  Follow up with [facility] as scheduled.

## 2020-12-08 LAB — CULTURE, BODY FLUID W GRAM STAIN -BOTTLE: Culture: NO GROWTH

## 2020-12-09 NOTE — Progress Notes (Signed)
H&P  Chief Complaint: Here for follow-up of urinary retention  History of Present Illness: 75 year old male with multiple medical problems, end-stage renal disease, on chronic hemodialysis with urinary obstruction/incomplete emptying.  For several months he has had a catheter.  He wants to continue to use this indwelling Foley catheter.  He has had no problems with infections with this and has had no problems with changing the catheter.  He still makes urine, perhaps 100 mL overnight.  Past Medical History:  Diagnosis Date   A-fib [facility])    when initially stating dialysis january 2021 at [facility] per spouse; never heard anything else about it   Alcoholic cirrhosis [facility])    patient reports completing hep a and b vaccines in 2006   Anemia    has had 3 units of prbcs 2013   Anxiety    Arthritis    Asymptomatic gallstones    Ultrasound in 2006   B12 deficiency 11/21/2020   C. difficile colitis    Cholelithiasis    Chronic kidney disease    Dialysis M/W/F/Sa   Depression    Diabetes ([place])    Duodenal ulcer with hemorrhage    per patient in 2006 or 2007, records have been requested   Dyspnea    low oxygen level -  has O2 2 L all day   Esophageal varices ([place])    see [facility]   GERD (gastroesophageal reflux disease)    Heart burn    History of alcohol abuse    quit 05/2013   HOH (hard of hearing)    Hypertension    Liver cirrhosis ([place])    SBP (spontaneous bacterial peritonitis) ([place])    2020, November 2021.    Splenomegaly    Ultrasound in 2006    Past Surgical History:  Procedure Laterality Date   AGILE CAPSULE N/A 09/20/2019   Procedure: AGILE CAPSULE;  Surgeon: Daneil Dolin, MD;  [facility];  Service: Endoscopy;  Laterality: N/A;   APPENDECTOMY     AV FISTULA PLACEMENT Left 08/17/2019   Procedure: ARTERIOVENOUS (AV) FISTULA CREATION VERSES ARTERIOVENOUS GRAFT;  Surgeon: [name], MD;  Location: [place];  Service: Vascular;  Laterality: Left;    BASCILIC VEIN TRANSPOSITION Left 09/28/2019   Procedure: LEFT SECOND STAGE [place];  Surgeon: [name], MD;  Location: [place];  Service: Vascular;  Laterality: Left;   BIOPSY  02/03/2018   Procedure: BIOPSY;  Surgeon: Daneil Dolin, MD;  [facility];  Service: Endoscopy;;  bx of gastric polyps   BIOPSY  09/19/2019   Procedure: BIOPSY;  Surgeon: Danie Binder, MD;  [facility];  Service: Endoscopy;;   CATARACT EXTRACTION Right    COLONOSCOPY  03/10/2005   Rectal polyp as described above, removed with snare. Left sided  diverticula. The remainder of the colonic mucosa appeared normal. Inflammed polyp on path.   COLONOSCOPY  04/1999   Dr. Thea Silversmith polyps removed,  Path showed hyperplastic   COLONOSCOPY  10/2015   Dr. Britta Mccreedy: diverticulosis, single sessile tubular adenoma 3-30mm in size removed from descending colon.    COLONOSCOPY WITH ESOPHAGOGASTRODUODENOSCOPY (EGD)  03/31/2012   MEQ:ASTMHDQ AVMs. Colonic diverticulosis. tubular adenoma colon   COLONOSCOPY WITH ESOPHAGOGASTRODUODENOSCOPY (EGD)  06/2019   FORSYTH: small esophageal varices without high risk stigmata, single large AVM on lesser curvature in gastric body s/p APC ablation, gastric antral and duodenal bulb polyposis. No significant source to explain transfusion dependent anemia. Colonoscopy with portal  colopathy and diffuse edema, changes of Cdiff colitis on colonoscopy   ESOPHAGEAL BANDING N/A 02/03/2018   Procedure: ESOPHAGEAL BANDING;  Surgeon: Daneil Dolin, MD;  [facility];  Service: Endoscopy;  Laterality: N/A;   ESOPHAGOGASTRODUODENOSCOPY  01/15/2005   Three columns grade 1 to 2 esophageal varices, otherwise normal esophageal mucosa.  Esophagus was not manipulated otherwise./Nodularity of the antrum with overlying erosions, nonspecific finding. Path showed rare H.pylori   ESOPHAGOGASTRODUODENOSCOPY  09/2008   Dr. Gaylene Brooks columns of grade 2-3 esoph varices,  only one column was prominent. Portal gastropathy, multiple gastrc polyps at antrum, two were 2cm with black eschar, bulbar polyps, bulbar erosions   ESOPHAGOGASTRODUODENOSCOPY  11/2004   Dr. Brantley Stage 3 esoph varices   ESOPHAGOGASTRODUODENOSCOPY  03/31/2012   RMR: 4 columns(3-GR 2, 1-GR1) non-bleeding esophageal varices, portal gastropathy, small HH, early GAVE, multiple gastric polyps    ESOPHAGOGASTRODUODENOSCOPY (EGD) WITH PROPOFOL N/A 02/03/2018   Dr. Gala Romney: Esophageal varices, 3 columns grade 1-2.  Portal hypertensive gastropathy.  Multiple gastric polyps, biopsy consistent with hyperplastic.   ESOPHAGOGASTRODUODENOSCOPY (EGD) WITH PROPOFOL N/A 09/19/2019   Fields: grade I and II esophageal varcies, mild portal hypertensive gastropathy, moderate gastritis but no H. pylori, single hyperplastic gastric polyp removed, obvious source for melena/transfusion dependent anemia not identified, may be due to friable gastric and duodenal mucosa in the setting of portal hypertension   GASTRIC VARICES BANDING  03/31/2012   Procedure: GASTRIC VARICES BANDING;  Surgeon: Daneil Dolin, MD;  [facility];  Service: Endoscopy;  Laterality: N/A;   GIVENS CAPSULE STUDY N/A 09/21/2019   Poor study with a lot of debris obstructing much of the view of the first approximate 3 hours out of 6 hours.  No obvious source of bleeding identified.  Sequela of bleeding and old blood seen in the form of"whisps" of blood, blood flecks mostly toward the end of the study.   IR REMOVAL TUN CV CATH W/O FL  02/15/2020   POLYPECTOMY  09/19/2019   Procedure: POLYPECTOMY;  Surgeon: Danie Binder, MD;  [facility];  Service: Endoscopy;;   UMBILICAL HERNIA REPAIR  2017   exploratory laparotomy, abdominal washout    Home Medications:  Allergies as of 12/10/2020       Reactions   Tape Itching, Rash        Medication List        Accurate as of December 09, 2020  7:08 PM. If you have any questions, ask  your nurse or doctor.          acetaminophen 500 MG tablet Commonly known as: TYLENOL Take 1,000 mg by mouth as needed for mild pain.   Adjustable Lancing Device Misc Apply 1 each topically daily.   Alcohol Prep 70 % Pads Apply 1 each topically 3 (three) times daily.   Alka-Seltzer Heartburn + Gas 750-80 MG Chew Generic drug: Calcium Carbonate-Simethicone Chew by mouth as needed.   ALPRAZolam 0.5 MG tablet Commonly known as: XANAX Take 0.5 mg by mouth daily as needed.   calcium carbonate 500 MG chewable tablet Commonly known as: TUMS - dosed in mg elemental calcium Chew 1 tablet by mouth 3 (three) times daily with meals.   cholestyramine 4 GM/DOSE powder Commonly known as: QUESTRAN SMARTSIG:1 Scoopful By Mouth Daily   diltiazem 120 MG 24 hr capsule Commonly known as: TIAZAC Take 120 mg by mouth daily.   furosemide 40 MG tablet Commonly known as: LASIX Take 80 mg by mouth daily.  gabapentin 100 MG capsule Commonly known as: NEURONTIN TAKE 1 CAPSULE BY MOUTH AT BEDTIME   HumaLOG KwikPen 100 UNIT/ML KwikPen Generic drug: insulin lispro SMARTSIG:50 Unit(s) SUB-Q Twice Daily   hydrocortisone 2.5 % rectal cream Commonly known as: Anusol-HC Place 1 application rectally 2 (two) times daily. What changed:  when to take this reasons to take this   hydrOXYzine 25 MG capsule Commonly known as: VISTARIL Take 25 mg by mouth 3 (three) times daily as needed for itching.   hydrOXYzine 25 MG tablet Commonly known as: ATARAX/VISTARIL Take 25 mg by mouth 3 (three) times daily as needed.   iron polysaccharides 150 MG capsule Commonly known as: NIFEREX Take 150 mg by mouth at bedtime.   lactulose 10 GM/15ML solution Commonly known as: Constulose TAKE 45 MLS EVERY MORNING AND TAKE 45 MLS EVERY EVENING What changed: additional instructions   levofloxacin 500 MG tablet Commonly known as: Levaquin Take 500 mg 3 times a week after dialysis.   metoprolol succinate 25  MG 24 hr tablet Commonly known as: TOPROL-XL Take 1 tablet (25 mg total) by mouth daily. Take at bedtime  on Monday ,Wednesday and Friday due to dialaylisis What changed:  when to take this additional instructions   midodrine 10 MG tablet Commonly known as: PROAMATINE Take 1 tablet (10 mg total) by mouth 2 (two) times daily with a meal.   milk thistle 175 MG tablet Take 175 mg by mouth 3 (three) times daily.   mirtazapine 30 MG tablet Commonly known as: REMERON Take 1 tablet (30 mg total) by mouth at bedtime.   multivitamin with minerals Tabs tablet Take 1 tablet by mouth in the morning.   NovoFine 32G X 6 MM Misc Generic drug: Insulin Pen Needle Inject into the skin.   nystatin powder Commonly known as: MYCOSTATIN/NYSTOP Apply topically 3 (three) times daily. Into groin area and affected area What changed:  when to take this reasons to take this   Omega-3 1000 MG Caps ( 200 mg ), bid, # 120   omega-3 acid ethyl esters 1 g capsule Commonly known as: LOVAZA Take 2 g by mouth 2 (two) times daily.   omeprazole 40 MG capsule Commonly known as: PRILOSEC Take 40 mg by mouth daily.   ondansetron 4 MG tablet Commonly known as: ZOFRAN Take 4 mg by mouth as needed for nausea or vomiting.   oxyCODONE-acetaminophen 5-325 MG tablet Commonly known as: PERCOCET/ROXICET Take 1 tablet by mouth every 4 (four) hours as needed for moderate pain.   polyethylene glycol-electrolytes 420 g solution Commonly known as: TriLyte Take 4,000 mLs by mouth as directed.   pravastatin 20 MG tablet Commonly known as: PRAVACHOL Take 20 mg by mouth in the morning.   PROBIOTIC DAILY PO Take 1 tablet by mouth in the morning.   propranolol 20 MG tablet Commonly known as: INDERAL Take by mouth.   spironolactone 50 MG tablet Commonly known as: ALDACTONE Take 50 mg by mouth in the morning.   tamsulosin 0.4 MG Caps capsule Commonly known as: FLOMAX Take 0.4 mg by mouth every evening.    thiamine 100 MG tablet Commonly known as: Vitamin B-1 Take 1 tablet by mouth daily.   tiZANidine 2 MG tablet Commonly known as: ZANAFLEX Take 2 mg by mouth 2 (two) times daily as needed for muscle spasms.   [name] Generic drug: insulin degludec Inject 120 Units into the skin at bedtime. When blood sugars are up What changed: additional instructions  TRI-VIT/FLUORIDE/IRON PO daily   triamcinolone cream 0.1 % Commonly known as: KENALOG Apply 1 application topically 2 (two) times daily as needed (dry skin Itching).   venlafaxine 75 MG tablet Commonly known as: EFFEXOR Take 75 mg by mouth 3 (three) times daily with meals.   vitamin C 1000 MG tablet Take 1,000 mg by mouth 2 (two) times daily.   Xifaxan 550 MG Tabs tablet Generic drug: rifaximin TAKE 1 TABLET BY MOUTH TWICE DAILY What changed: how much to take        Allergies:  Allergies  Allergen Reactions   Tape Itching and Rash    Family History  Problem Relation Age of Onset   Colon cancer Father 6       deceased age 30   Breast cancer Sister        deceased   Diabetes Sister    Stroke Mother    Healthy Son    Healthy Daughter    Lung cancer Neg Hx    Ovarian cancer Neg Hx     Social History:  reports that he quit smoking about 34 years ago. His smoking use included cigarettes. He has a 50.00 pack-year smoking history. He has never used smokeless tobacco. He reports that he does not drink alcohol and does not use drugs.  ROS: A complete review of systems was performed.  All systems are negative except for pertinent findings as noted.  Physical Exam:  Vital signs in last 24 hours: There were no vitals taken for this visit. Constitutional:  Alert and oriented, No acute distress Cardiovascular: Regular rate  Respiratory: Normal respiratory effort Neurologic: Grossly intact, no focal deficits Psychiatric: Normal mood and affect  I have reviewed prior pt  notes  I have reviewed notes from referring/previous physicians     Impression/Assessment:  BPH with obstruction  Plan:  We will continue to change his indwelling catheter every month  I will see him back in a year for recheck

## 2020-12-10 ENCOUNTER — Ambulatory Visit: Payer: Medicare Other | Admitting: Urology

## 2020-12-10 ENCOUNTER — Other Ambulatory Visit: Payer: Self-pay

## 2020-12-10 ENCOUNTER — Encounter: Payer: Self-pay | Admitting: Urology

## 2020-12-10 VITALS — BP 118/66 | HR 73 | Temp 98.3°F

## 2020-12-10 DIAGNOSIS — R339 Retention of urine, unspecified: Secondary | ICD-10-CM | POA: Diagnosis not present

## 2020-12-10 NOTE — Progress Notes (Signed)
Urological Symptom Review  Patient is experiencing the following symptoms: Blood in urine   Review of Systems  Gastrointestinal (upper)  : Negative for upper GI symptoms  Gastrointestinal (lower) : Negative for lower GI symptoms  Constitutional : Negative for symptoms  Skin: Itching  Eyes: Negative for eye symptoms  Ear/Nose/Throat : Negative for Ear/Nose/Throat symptoms  Hematologic/Lymphatic: Negative for Hematologic/Lymphatic symptoms  Cardiovascular : Leg swelling  Respiratory : Negative for respiratory symptoms  Endocrine: Excessive thirst  Musculoskeletal: Back pain Joint pain  Neurological: Negative for neurological symptoms  Psychologic: Negative for psychiatric symptoms

## 2020-12-11 NOTE — Patient Instructions (Signed)
Foley Catheter Care and Patient Education  Perform catheter care every day.  You can do this while in the shower, but NOT while taking a tub bath.  You will need the following supplies: -mild soap, such as Dove -water -a clean washcloth (not one already used for bathing) or a 4x4 piece of gauze -1 Cath-Secure -night drainage bag -2 alcohol swabs  Was you hands thoroughly with soap and water Using mild soap and water, clan your genital area Men should retract the foreskin, if needed, and clean the area, including the penis Women should separate the labia, and clean the area from front to back  3. Clean your urinary opening, which is where the catheter enters your body. 4. Clean the catheter from where it enters your body and then down, away from your body.  Hold the catheter at the point it enters your body so that you don't put tension on it. 5. Rinse the area well and dry it gently.  Changing the drainage bag You will change your drainage bag twice a day -in the morning after you shower, change he night bag to the leg bag -at night before you go to bed, change the leg bag to the night bag  Wash your hands thoroughly with soap and water Empty the urine from the drainage bag into the toilet before you change it Pinch off the catheter with your fingers and disconnect the used bag Wipe the end of the catheter using an alcohol pad Wipe the connector on the bag using the second alcohol pad Connect the clean bag to the catheter and release your finger pinch Check all connections.  Straighten any kinks or twits in the tubing  Caring for the Leg bag -always wear the leg bag below your knee.  This will help it drain. -keep the leg bag secure with the velcro straps.  If the straps leave a mark on your leg they are to tight and should be loosened.  Leaving the straps too tight can decrease you circulation and lead to blood clots. -empty the leg bag through the spout at the bottom every  2-4 hours as needed.  Do not let the bag become completely full. -do not lie down for longer than 2 hours while you are wearing the leg bag.  Caring for the Night Bag -always keep the night bag below the level of your bladder . -to hang your night bag while you sleep, place a clean plastic bag inside of a wastebasket.  Hang the night bag on the inside of the wastebasket.  Cleaning the Drainage bag Wash you hands thoroughly with soap and water. Rinse the equipment with cool water.  Do not use hot water it can damage the plastic equipment. Was the equipment with a mild liquid detergent (ivory) and rinse with cool water. To decrease odor, fill the bag halfway with a mixture of 1 part vinegar and 3 parts water. Shake the bag and let it sit for 15 minutes. Rinse the bag with cool water and hang it up to dry.  Preventing infection -keep the drainage bag below the level of your bladder and off the floor at all times. -keep the catheter secured to your thigh to prevent it from moving. -do not lie on or block the flow of urine in the tubing. -shower daily to keep the catheter clean. -clean your hands before and after touching the catheter or bag. -the spout of the drainage bag should never touch the side of   the toilet or any emptying container.  Special Points -You may see some blood or urine around where the catheter enters your body, especially when walking or having a bowel movement.  This is normal, as long as there is urine draining into the drainage bag.  If you experience significant leakage around  catheter tube where it enters your urethra possibly associated with lower abdominal cramping you could be having a bladder spasm.  Please notify your doctor and we can prescribe you a medication to improve your symptoms. -drink 1-2 glasses of liquids every 2 hours while you're awake.  Call your doctor immediately if -your catheter comes out, do not try to replace it yourself -you have  temperature of 101F (38.8C) or higher -you have decrease in the amount of urine you are making -you have foul-smelling urine -you have bright red blood or large blood clots in your urine -you have abdominal pain and no urine in your catheter bag   

## 2020-12-11 NOTE — Progress Notes (Signed)
Cath Change/ Replacement  Patient is present today for a catheter change due to urinary retention.  24ml of water was removed from the balloon, a 16FR foley cath was removed with out difficulty.  Patient was cleaned and prepped in a sterile fashion with betadine. A 16 FR foley cath was replaced into the bladder no complications were noted Urine return was noted 55ml and catheter flushed to ensure placement. The balloon was filled with 21ml of sterile water. A leg bag was attached for drainage.  A night bag was also given to the patient and patient was given instruction on how to change from one bag to another. Patient was given proper instruction on catheter care.    Performed by: Abriel Geesey LPN  Follow up: Keep next scheduled [place]

## 2020-12-16 ENCOUNTER — Other Ambulatory Visit: Payer: Self-pay

## 2020-12-16 ENCOUNTER — Inpatient Hospital Stay (HOSPITAL_COMMUNITY): Payer: Medicare Other

## 2020-12-16 DIAGNOSIS — D509 Iron deficiency anemia, unspecified: Secondary | ICD-10-CM | POA: Diagnosis not present

## 2020-12-16 DIAGNOSIS — D500 Iron deficiency anemia secondary to blood loss (chronic): Secondary | ICD-10-CM

## 2020-12-16 LAB — CBC WITH DIFFERENTIAL/PLATELET
Abs Immature Granulocytes: 0.01 10*3/uL (ref 0.00–0.07)
Basophils Absolute: 0 10*3/uL (ref 0.0–0.1)
Basophils Relative: 1 %
Eosinophils Absolute: 0.3 10*3/uL (ref 0.0–0.5)
Eosinophils Relative: 10 %
HCT: 26.6 % — ABNORMAL LOW (ref 39.0–52.0)
Hemoglobin: 8.4 g/dL — ABNORMAL LOW (ref 13.0–17.0)
Immature Granulocytes: 0 %
Lymphocytes Relative: 9 %
Lymphs Abs: 0.2 10*3/uL — ABNORMAL LOW (ref 0.7–4.0)
MCH: 31.8 pg (ref 26.0–34.0)
MCHC: 31.6 g/dL (ref 30.0–36.0)
MCV: 100.8 fL — ABNORMAL HIGH (ref 80.0–100.0)
Monocytes Absolute: 0.2 10*3/uL (ref 0.1–1.0)
Monocytes Relative: 7 %
Neutro Abs: 2 10*3/uL (ref 1.7–7.7)
Neutrophils Relative %: 73 %
Platelets: 85 10*3/uL — ABNORMAL LOW (ref 150–400)
RBC: 2.64 MIL/uL — ABNORMAL LOW (ref 4.22–5.81)
RDW: 16.5 % — ABNORMAL HIGH (ref 11.5–15.5)
WBC: 2.7 10*3/uL — ABNORMAL LOW (ref 4.0–10.5)
nRBC: 0 % (ref 0.0–0.2)

## 2020-12-16 LAB — SAMPLE TO BLOOD BANK

## 2020-12-17 ENCOUNTER — Encounter (HOSPITAL_COMMUNITY): Payer: Self-pay

## 2020-12-17 ENCOUNTER — Inpatient Hospital Stay (HOSPITAL_COMMUNITY): Payer: Medicare Other

## 2020-12-17 VITALS — BP 136/47 | HR 74 | Temp 96.8°F | Resp 18

## 2020-12-17 DIAGNOSIS — D509 Iron deficiency anemia, unspecified: Secondary | ICD-10-CM | POA: Diagnosis not present

## 2020-12-17 DIAGNOSIS — E538 Deficiency of other specified B group vitamins: Secondary | ICD-10-CM

## 2020-12-17 MED ORDER — SODIUM CHLORIDE 0.9 % IV SOLN
Freq: Once | INTRAVENOUS | Status: AC
Start: 2020-12-17 — End: 2020-12-17

## 2020-12-17 MED ORDER — CYANOCOBALAMIN 1000 MCG/ML IJ SOLN
1000.0000 ug | Freq: Once | INTRAMUSCULAR | Status: AC
Start: 2020-12-17 — End: 2020-12-17
  Administered 2020-12-17: 1000 ug via INTRAMUSCULAR
  Filled 2020-12-17: qty 1

## 2020-12-17 MED ORDER — SODIUM CHLORIDE 0.9 % IV SOLN
300.0000 mg | Freq: Once | INTRAVENOUS | Status: AC
  Administered 2020-12-17: 300 mg via INTRAVENOUS
  Filled 2020-12-17: qty 300

## 2020-12-17 NOTE — Patient Instructions (Signed)
[facility]  Discharge Instructions: Thank you for choosing [facility] to provide your oncology and hematology care.  If you have a lab appointment with the [facility], please come in thru the Main Entrance and check in at the main information desk.  Wear comfortable clothing and clothing appropriate for easy access to any Portacath or PICC line.   We strive to give you quality time with your provider. You may need to reschedule your appointment if you arrive late (15 or more minutes).  Arriving late affects you and other patients whose appointments are after yours.  Also, if you miss three or more appointments without notifying the office, you may be dismissed from the clinic at the provider's discretion.      For prescription refill requests, have your pharmacy contact our office and allow 72 hours for refills to be completed.    Today you received Venofer 300 mg [place] Infusion.       To help prevent nausea and vomiting after your treatment, we encourage you to take your nausea medication as directed.  BELOW ARE SYMPTOMS THAT SHOULD BE REPORTED IMMEDIATELY: *FEVER GREATER THAN 100.4 F (38 C) OR HIGHER *CHILLS OR SWEATING *NAUSEA AND VOMITING THAT IS NOT CONTROLLED WITH YOUR NAUSEA MEDICATION *UNUSUAL SHORTNESS OF BREATH *UNUSUAL BRUISING OR BLEEDING *URINARY PROBLEMS (pain or burning when urinating, or frequent urination) *BOWEL PROBLEMS (unusual diarrhea, constipation, pain near the anus) TENDERNESS IN MOUTH AND THROAT WITH OR WITHOUT PRESENCE OF ULCERS (sore throat, sores in mouth, or a toothache) UNUSUAL RASH, SWELLING OR PAIN  UNUSUAL VAGINAL DISCHARGE OR ITCHING   Items with * indicate a potential emergency and should be followed up as soon as possible or go to the Emergency Department if any problems should occur.  Please show the CHEMOTHERAPY ALERT CARD or IMMUNOTHERAPY ALERT CARD at check-in to the Emergency Department and triage nurse.  Should  you have questions after your visit or need to cancel or reschedule your appointment, please contact [facility] [phone]  and follow the prompts.  Office hours are 8:00 a.m. to 4:30 p.m. Monday - Friday. Please note that voicemails left after 4:00 p.m. may not be returned until the following business day.  We are closed weekends and major holidays. You have access to a nurse at all times for urgent questions. Please call the main number to the clinic [phone] and follow the prompts.  For any non-urgent questions, you may also contact your provider using MyChart. We now offer e-Visits for anyone 65 and older to request care online for non-urgent symptoms. For details visit [email or web address].   Also download the MyChart app! Go to the app store, search "MyChart", open the app, select [facility], and log in with your MyChart username and password.  Due to Covid, a mask is required upon entering the hospital/clinic. If you do not have a mask, one will be given to you upon arrival. For doctor visits, patients may have 1 support person aged 71 or older with them. For treatment visits, patients cannot have anyone with them due to current Covid guidelines and our immunocompromised population.

## 2020-12-17 NOTE — Progress Notes (Signed)
Patient presents today for Venofer 300 mg Infusion. Patient has no complaints of any changes since his last visit. Vital signs stable. MAR reviewed and updated. B12 injection given today.   Venofer 300 mgs given today per MD orders. Tolerated infusion without adverse affects. Vital signs stable. No complaints at this time. Discharged from clinic in stable condition by wheel chair accompanied by his wife. Alert and oriented x 3. F/U with [facility] as scheduled.

## 2020-12-19 ENCOUNTER — Ambulatory Visit (HOSPITAL_COMMUNITY): Payer: Medicare Other

## 2020-12-20 ENCOUNTER — Other Ambulatory Visit: Payer: Self-pay | Admitting: *Deleted

## 2020-12-20 NOTE — Patient Outreach (Signed)
[facility]) Care Management  12/20/2020  TRAVEON LOURO [birth year] [id number]   Telephone Assessment-Unsuccessful-DM  RN attempted outreach call today however unsuccessful. Able to leave a HIPPA voice message requesting a call back.  Will attempted another outreach next to inquire further on pt's ongoing management of care.  Raina Mina, RN Care Management Coordinator [place] Office [phone]

## 2020-12-26 ENCOUNTER — Other Ambulatory Visit: Payer: Self-pay | Admitting: *Deleted

## 2020-12-26 ENCOUNTER — Ambulatory Visit: Payer: Medicare Other | Admitting: *Deleted

## 2020-12-26 ENCOUNTER — Other Ambulatory Visit: Payer: Self-pay

## 2020-12-26 ENCOUNTER — Ambulatory Visit (HOSPITAL_COMMUNITY)
Admission: RE | Admit: 2020-12-26 | Discharge: 2020-12-26 | Disposition: A | Discharge status: Home / Self Care | Payer: Medicare Other | Source: Ambulatory Visit | Attending: Internal Medicine | Admitting: Internal Medicine

## 2020-12-26 DIAGNOSIS — R188 Other ascites: Secondary | ICD-10-CM

## 2020-12-26 LAB — BODY FLUID CELL COUNT WITH DIFFERENTIAL
Eos, Fluid: 2 %
Lymphs, Fluid: 38 %
Monocyte-Macrophage-Serous Fluid: 55 % (ref 50–90)
Neutrophil Count, Fluid: 5 % (ref 0–25)
Total Nucleated Cell Count, Fluid: 185 cu mm (ref 0–1000)

## 2020-12-26 NOTE — Procedures (Signed)
  US guided LLQ paracentesis  2.5 L cloudy yellow fluid Sent for labs per MD  Tolerated well  EBL: 1 cc

## 2020-12-26 NOTE — Progress Notes (Signed)
Paracentesis complete no signs of distress.  

## 2020-12-26 NOTE — Patient Outreach (Addendum)
[facility]) Care Management  12/26/2020  Jeffrey [name] [birth year] [id number]  Telephone Assessment-Successful-Diabetes  Pt reports he is doing well and mentioned an episode of received HD today for added fluid build up via examation by  [name]. Pt states removed 2.5 liters via paracentesis weighting 211 lbs. Pt will resume his normal days starting back tomorrow. Plan of care reviewed and discussed accordingly with all updates within the plan of care. No acute needs at this time as pt continue to do well in managing his care with no missed days from HD.   Plan to follow up with pt next month with ongoing  case management needs.   Goals Addressed             This Visit's Progress    THN-Disease Progression Prevented or Minimized   On track    Follow up date: 01/21/2021 Timeframe:  Short-Term Goal Priority:  Medium Start Date:     07/23/2020                        Expected End Date:  02/28/2021                      Evidence-based guidance:   Prepare patient for laboratory tests, such as lipid profile, microalbuminuria, estimated glomerular filtration rate and diagnostic exams based on risk and presentation.  Assess signs/symptoms and risk factors for hypertension, dyslipidemia, nephropathy, neuropathy and retinopathy.  Encourage lifestyle changes, such as increased intake of plant-based foods, stress reduction, regular physical activity and smoking cessation to prevent long-term complications and chronic diseases.  Discourage sedentary behavior or sitting longer than 30 minutes without interruption; individualize exercise or activity recommendations with potential limitations.    Implement additional individualized goals and interventions based on identified risk factors.  Prepare patient for consultation or referral for specialist care, such as ophthalmology, neurology, cardiology, podiatry, nephrology or perinatology. All that imply related to pt's medical history.    Barriers: Health Behaviors  Notes: 6/23-Continue to verify pt continues to do will with no acute issues or needs at this time.Will continue to encouraged adherence through her primary caregiver [name]. 5/23-Verified pt continue weekly dialysis with no missed appointments (history) and has attended all medical appointments with his last PCP on 5/19 [name]. No major issues or acute needs at this time as pt and caregiver continue to manager the pt's care very well. Reports last A1C at 6.5 in April. Will continue praise all efforts in managing the pt's diabetes with the current management of care exhibited. No additional visits reported vi [facility] and pt continue to tolerate dialysis without acute issues. 4/19-Spouse reports pt continue HD 3 X weekly with no missed appointments. Pt continue to attend all medical appointments with sufficient transportation. No medication changes as pt remain adherent to taking all his medications. Pending appointments discussed with Oncologist on 4/21 and [name] for labs on 4/26. Pt had two falls several weeks ago with some bruises that are healing to his knees and toes. Verified pt did not have a head injury and did not require hospital or ED visit. Encouraged ongoing use of his DME RW for safety precaution.  3/22-Pt active with his ongoing HD 3 X weekly and attendance to all appointments.Visit to [facility] today for fluid retention (usually 1 liter removed). Pt goes every few weeks due to ongoing swelling to his LE.  Pt hx of anemia with Hgb 7 (  being monitored). Spouse ([name]) feel pt may benefit from additional HHPT. Encouraged to make this request with [name] (receptive and she will inquire). Currently pt is using a RW unable to maintain stability with a cane at this time. Reports AM glucose at 140 and maintaining this average. Will continue to encouraged adherence and praised pt for his efforts in maintaining his health and adhering to all goals and  interventions.  2/22-Will discuss all above and verified pt's has seen both his ophthalmologist and podiatry over the last year. Pt has been hospitalized with this years appointments and will rescheduled for his annuals. Stress the importance of completing this task. Verified no symptoms of hyper/hypoglycemia have occurred over the last month since the last conversation. Reports last Ac1 6.0 last week at the providers office and the last CBG read at 200. Aware this is elevated and RN case manager discuss ways to reduce this number. Pt has been lower in the past and has some stressors going on at this time.     THN-Learn more abont my health   On track    Follow Up Date 12/26/2020 Timeframe:  Long-Range Goal Priority:  Medium Start Date:    06/20/2020                         Expected End Date:    02/28/2021                   - make a list of questions - ask questions - repeat what I heard to make sure I understand - bring a list of my medicines to the visit - speak up when I don't understand   Barriers: Health Behaviors  Why is this important?   The best way to learn about your health and care is by talking to the doctor and nurse.  They will answer your questions and give you information in the way that you like best.    Notes:  6/23: Wife continues to be educated to assist pt with knowing more about pt's ongoing medical issues. Wife reports pt more aware of his medical issues and treatment options.  4/19- Will continue to discussed long term goals and interventions related to understanding of all his medications and all his medical conditions that require daily monitoring.                 1/20-Mange your diet, keep all appointments for diease related illness, follow all recommendations for treatment management and always utilize prevention measures to avoid acute encounters.  Will verbalize pt's understanding and verify with teach-back method.     THN-Make and Keep All Appointments   On  track    Follow Up Date : 01/21/2021 Timeframe:  Long-Range Goal Priority:  Medium Start Date:     05/23/2020                        Expected End Date:        01/29/2021                 - call to cancel if needed - keep a calendar with prescription refill dates - keep a calendar with appointment dates   Barriers: Health Behaviors  Why is this important?   Part of staying healthy is seeing the doctor for follow-up care.  If you forget your appointments, there are some things you can do to stay on track.  Notes:  7/28-Pt continue to keep all appointments with no reported delays. Pt states he is attending all HD appointments in additional to add days when needed via [name]. No needed refills as pt taking all medications as prescribed. 4/19 Will extend this goal to allow ongoing adherence as pt has missed some HD appointments over the last 3 months. Continue to stress the importance of pt attending all his medical appointments to avoid "no show" fees and acute events from happending related to missed HD appointments.  1/20-Spouse reports several appointments were rescheduled due to incremental weather but verified. Pt missed two dialysis appointment however was able to re-stabilize fluid removed via dialysis on another day. Will strongly encourage adherence with all medical appointments.  12/21-Verified pt continue to remain adherent with all dialysis and medical appointments scheduled. Will extend to allow ongoing adherence.         Raina Mina, RN Care Management Coordinator [place] Office [phone]

## 2020-12-29 LAB — CULTURE, BODY FLUID W GRAM STAIN -BOTTLE

## 2020-12-31 ENCOUNTER — Other Ambulatory Visit: Payer: Self-pay | Admitting: *Deleted

## 2020-12-31 DIAGNOSIS — R188 Other ascites: Secondary | ICD-10-CM

## 2021-01-03 ENCOUNTER — Ambulatory Visit (HOSPITAL_COMMUNITY)
Admission: RE | Admit: 2021-01-03 | Discharge: 2021-01-03 | Disposition: A | Discharge status: Home / Self Care | Payer: Medicare Other | Source: Ambulatory Visit | Attending: Internal Medicine | Admitting: Internal Medicine

## 2021-01-03 ENCOUNTER — Other Ambulatory Visit: Payer: Self-pay

## 2021-01-03 ENCOUNTER — Encounter (HOSPITAL_COMMUNITY): Payer: Self-pay

## 2021-01-03 DIAGNOSIS — R188 Other ascites: Secondary | ICD-10-CM

## 2021-01-03 LAB — BODY FLUID CELL COUNT WITH DIFFERENTIAL
Eos, Fluid: 0 %
Lymphs, Fluid: 78 %
Monocyte-Macrophage-Serous Fluid: 17 % — ABNORMAL LOW (ref 50–90)
Neutrophil Count, Fluid: 5 % (ref 0–25)
Total Nucleated Cell Count, Fluid: 173 cu mm (ref 0–1000)

## 2021-01-03 LAB — GRAM STAIN: Gram Stain: NONE SEEN

## 2021-01-03 NOTE — Sedation Documentation (Signed)
PT tolerated left sided paracentesis procedure well today and 3.4 liters of cloudy yellow fluid removed with labs collected and sent for processing. PT's vital signs remained stable at completion of procedure and NAD noted when patient was discharged and transported via wheelchair back to waiting room where wife was waiting. Pt verbalized understanding of discharge instructions.

## 2021-01-07 ENCOUNTER — Other Ambulatory Visit: Payer: Self-pay

## 2021-01-07 ENCOUNTER — Ambulatory Visit (INDEPENDENT_AMBULATORY_CARE_PROVIDER_SITE_OTHER): Payer: Medicare Other

## 2021-01-07 DIAGNOSIS — R339 Retention of urine, unspecified: Secondary | ICD-10-CM | POA: Diagnosis not present

## 2021-01-07 LAB — PATHOLOGIST SMEAR REVIEW

## 2021-01-07 NOTE — Patient Instructions (Signed)
Foley Catheter Care and Patient Education  Perform catheter care every day.  You can do this while in the shower, but NOT while taking a tub bath.  You will need the following supplies: -mild soap, such as Dove -water -a clean washcloth (not one already used for bathing) or a 4x4 piece of gauze -1 Cath-Secure -night drainage bag -2 alcohol swabs  Was you hands thoroughly with soap and water Using mild soap and water, clan your genital area Men should retract the foreskin, if needed, and clean the area, including the penis Women should separate the labia, and clean the area from front to back  3. Clean your urinary opening, which is where the catheter enters your body. 4. Clean the catheter from where it enters your body and then down, away from your body.  Hold the catheter at the point it enters your body so that you don't put tension on it. 5. Rinse the area well and dry it gently.  Changing the drainage bag You will change your drainage bag twice a day -in the morning after you shower, change he night bag to the leg bag -at night before you go to bed, change the leg bag to the night bag  Wash your hands thoroughly with soap and water Empty the urine from the drainage bag into the toilet before you change it Pinch off the catheter with your fingers and disconnect the used bag Wipe the end of the catheter using an alcohol pad Wipe the connector on the bag using the second alcohol pad Connect the clean bag to the catheter and release your finger pinch Check all connections.  Straighten any kinks or twits in the tubing  Caring for the Leg bag -always wear the leg bag below your knee.  This will help it drain. -keep the leg bag secure with the velcro straps.  If the straps leave a mark on your leg they are to tight and should be loosened.  Leaving the straps too tight can decrease you circulation and lead to blood clots. -empty the leg bag through the spout at the bottom every  2-4 hours as needed.  Do not let the bag become completely full. -do not lie down for longer than 2 hours while you are wearing the leg bag.  Caring for the Night Bag -always keep the night bag below the level of your bladder . -to hang your night bag while you sleep, place a clean plastic bag inside of a wastebasket.  Hang the night bag on the inside of the wastebasket.  Cleaning the Drainage bag Wash you hands thoroughly with soap and water. Rinse the equipment with cool water.  Do not use hot water it can damage the plastic equipment. Was the equipment with a mild liquid detergent (ivory) and rinse with cool water. To decrease odor, fill the bag halfway with a mixture of 1 part vinegar and 3 parts water. Shake the bag and let it sit for 15 minutes. Rinse the bag with cool water and hang it up to dry.  Preventing infection -keep the drainage bag below the level of your bladder and off the floor at all times. -keep the catheter secured to your thigh to prevent it from moving. -do not lie on or block the flow of urine in the tubing. -shower daily to keep the catheter clean. -clean your hands before and after touching the catheter or bag. -the spout of the drainage bag should never touch the side of   the toilet or any emptying container.  Special Points -You may see some blood or urine around where the catheter enters your body, especially when walking or having a bowel movement.  This is normal, as long as there is urine draining into the drainage bag.  If you experience significant leakage around  catheter tube where it enters your urethra possibly associated with lower abdominal cramping you could be having a bladder spasm.  Please notify your doctor and we can prescribe you a medication to improve your symptoms. -drink 1-2 glasses of liquids every 2 hours while you're awake.  Call your doctor immediately if -your catheter comes out, do not try to replace it yourself -you have  temperature of 101F (38.8C) or higher -you have decrease in the amount of urine you are making -you have foul-smelling urine -you have bright red blood or large blood clots in your urine -you have abdominal pain and no urine in your catheter bag   

## 2021-01-07 NOTE — Progress Notes (Signed)
Cath Change/ Replacement  Patient is present today for a catheter change due to urinary retention.  35ml of water was removed from the balloon, a 16 FR foley cath was removed with out difficulty.  Patient was cleaned and prepped in a sterile fashion with betadine. A 16 FR foley cath was replaced into the bladder no complications were noted Urine return was noted 77ml and urine was yellow in color. The balloon was filled with 59ml of sterile water. A leg bag was attached for drainage.  A night bag was also given to the patient and patient was given instruction on how to change from one bag to another. Patient was given proper instruction on catheter care.    Performed by: Shizuo Biskup LPN  Follow up: Keep next scheduled [place]

## 2021-01-08 LAB — CULTURE, BODY FLUID W GRAM STAIN -BOTTLE
Culture: NO GROWTH
Special Requests: ADEQUATE

## 2021-01-08 NOTE — Patient Instructions (Addendum)
Your procedure is scheduled on: 01/16/2021  Report to [name] at     8:45 AM.  Call this number if you have problems the morning of surgery: [phone]   Remember:              Follow Directions on the letter you received from Your Physician's office regarding the Bowel Prep              No Smoking the day of Procedure :   Take these medicines the morning of surgery with A SIP OF WATER: xanax, diltiazem and metoprolol             Take only 1/2 dose of Tresiba (60 units) night before procedure               No diabetic medications am of procedure   Do not wear jewelry, make-up or nail polish.    Do not bring valuables to the hospital.  Contacts, dentures or bridgework may not be worn into surgery.  .   Patients discharged the day of surgery will not be allowed to drive home.     Colonoscopy, Adult, Care After This sheet gives you information about how to care for yourself after your procedure. Your health care provider may also give you more specific instructions. If you have problems or questions, contact your health care provider. What can I expect after the procedure? After the procedure, it is common to have: A small amount of blood in your stool for 24 hours after the procedure. Some gas. Mild abdominal cramping or bloating.  Follow these instructions at home: General instructions  For the first 24 hours after the procedure: Do not drive or use machinery. Do not sign important documents. Do not drink alcohol. Do your regular daily activities at a slower pace than normal. Eat soft, easy-to-digest foods. Rest often. Take over-the-counter or prescription medicines only as told by your health care provider. It is up to you to get the results of your procedure. Ask your health care provider, or the department performing the procedure, when your results will be ready. Relieving cramping and bloating Try walking around when you have cramps or feel bloated. Apply  heat to your abdomen as told by your health care provider. Use a heat source that your health care provider recommends, such as a moist heat pack or a heating pad. Place a towel between your skin and the heat source. Leave the heat on for 20-30 minutes. Remove the heat if your skin turns bright red. This is especially important if you are unable to feel pain, heat, or cold. You may have a greater risk of getting burned. Eating and drinking Drink enough fluid to keep your urine clear or pale yellow. Resume your normal diet as instructed by your health care provider. Avoid heavy or fried foods that are hard to digest. Avoid drinking alcohol for as long as instructed by your health care provider. Contact a health care provider if: You have blood in your stool 2-3 days after the procedure. Get help right away if: You have more than a small spotting of blood in your stool. You pass large blood clots in your stool. Your abdomen is swollen. You have nausea or vomiting. You have a fever. You have increasing abdominal pain that is not relieved with medicine. This information is not intended to replace advice given to you by your health care provider. Make sure you discuss any questions you have with your health  care provider. Document Released: 12/31/2003 Document Revised: 02/10/2016 Document Reviewed: 07/30/2015 Elsevier Interactive Patient Education  2018 [place] [facility], Adult, Care After This sheet gives you information about how to care for yourself after your procedure. Your health care provider may also give you more specific instructions. If you have problems or questions, contact your health careprovider. What can I expect after the procedure? After the procedure, it is common to have: A sore throat. Mild stomach pain or discomfort. Bloating. Nausea. Follow these instructions at home:  Follow instructions from your health care provider about what to eat or drink after  your procedure. Return to your normal activities as told by your health care provider. Ask your health care provider what activities are safe for you. Take over-the-counter and prescription medicines only as told by your health care provider. If you were given a sedative during the procedure, it can affect you for several hours. Do not drive or operate machinery until your health care provider says that it is safe. Keep all follow-up visits as told by your health care provider. This is important. Contact a health care provider if you have: A sore throat that lasts longer than one day. Trouble swallowing. Get help right away if: You vomit blood or your vomit looks like coffee grounds. You have: A fever. Bloody, black, or tarry stools. A severe sore throat or you cannot swallow. Difficulty breathing. Severe pain in your chest or abdomen. Summary After the procedure, it is common to have a sore throat, mild stomach discomfort, bloating, and nausea. If you were given a sedative during the procedure, it can affect you for several hours. Do not drive or operate machinery until your health care provider says that it is safe. Follow instructions from your health care provider about what to eat or drink after your procedure. Return to your normal activities as told by your health care provider. This information is not intended to replace advice given to you by your health care provider. Make sure you discuss any questions you have with your healthcare provider. Document Revised: 05/16/2019 Document Reviewed: 10/18/2017 Elsevier Patient Education  2022 [employer].

## 2021-01-10 NOTE — Progress Notes (Signed)
Jeffrey [name], Jeffrey [name] [id number]   CLINIC:  Medical Oncology/Hematology  PCP:  Monico Blitz, MD [place] [id number] [phone]   REASON FOR VISIT:  Follow-up for iron deficiency anemia, anemia of ESRD  PRIOR THERAPY: Intermittent IV iron supplementation  CURRENT THERAPY: Epogen (at dialysis), IV iron supplementation, PRBC transfusions as needed  INTERVAL HISTORY:  Jeffrey [name] 75 y.o. male returns for routine follow-up of his iron deficiency anemia/anemia of ESRD, thrombocytopenia, and leukopenia.  He was last seen by Tarri Abernethy PA-C on 11/21/2020.  Patient has pancytopenia of mixed etiology.  His anemia is related to ESRD (receives Epogen at dialysis), end-stage liver disease, cirrhosis, and intermittent GI bleeding.  He is now transfusion dependent, and has been receiving PRBC transfusions on average every 4 weeks.  Since his last visit, he received PRBC transfusion x1 on 11/26/2020.  He received IV Venofer on 11/28/2020, 12/05/2020, and 12/17/2020.  At today's visit, he reports feeling fairly well.  No recent hospitalizations, surgeries, or changes in baseline health status.  Jeffrey [name] reports that he did have some hematuria last week, possibly related to his chronic indwelling Foley catheter.  Hematuria resolved after catheter change.  No other sources of blood loss such as epistaxis, hematemesis, hematochezia, or melena.  He admits to easy bruising.  He is scheduled for EGD and colonoscopy later this week.  He reports some ongoing mild fatigue, with energy about 75%.  He has chronic dyspnea on exertion, but denies any chest pain, shortness of breath at rest, palpitations, or syncope.  No B symptoms such as fever, chills, night sweats, unintentional weight loss.  His last paracentesis was last Thursday, he reports that he is receiving paracentesis about every 2 weeks.  He has 75% energy and 100% appetite. He endorses that he is  maintaining a stable weight.   REVIEW OF SYSTEMS:  Review of Systems  Constitutional:  Positive for fatigue (75% energy). Negative for appetite change, chills, diaphoresis, fever and unexpected weight change.  HENT:   Negative for lump/mass and nosebleeds.   Eyes:  Negative for eye problems.  Respiratory:  Negative for cough, hemoptysis and shortness of breath.   Cardiovascular:  Negative for chest pain, leg swelling and palpitations.  Gastrointestinal:  Positive for nausea. Negative for abdominal pain, blood in stool, constipation, diarrhea and vomiting.  Genitourinary:  Positive for hematuria (now resolved).   Skin: Negative.   Neurological:  Positive for dizziness and headaches (occasional). Negative for light-headedness.  Hematological:  Does not bruise/bleed easily.  Psychiatric/Behavioral:  Positive for depression.      PAST MEDICAL/SURGICAL HISTORY:  Past Medical History:  Diagnosis Date   A-fib [facility])    when initially stating dialysis january 2021 at [facility] per spouse; never heard anything else about it   Alcoholic cirrhosis [facility])    patient reports completing hep a and b vaccines in 2006   Anemia    has had 3 units of prbcs 2013   Anxiety    Arthritis    Asymptomatic gallstones    Ultrasound in 2006   B12 deficiency 11/21/2020   C. difficile colitis    Cholelithiasis    Chronic kidney disease    Dialysis M/W/F/Sa   Depression    Diabetes ([place])    Duodenal ulcer with hemorrhage    per patient in 2006 or 2007, records have been requested   Dyspnea    low oxygen level -  has O2 2 L  all day   Esophageal varices (HCC)    see [facility]   GERD (gastroesophageal reflux disease)    Heart burn    History of alcohol abuse    quit 05/2013   HOH (hard of hearing)    Hypertension    Liver cirrhosis ([place])    SBP (spontaneous bacterial peritonitis) ([place])    2020, November 2021.    Splenomegaly    Ultrasound in 2006   Past Surgical History:  Procedure Laterality  Date   AGILE CAPSULE N/A 09/20/2019   Procedure: AGILE CAPSULE;  Surgeon: Daneil Dolin, MD;  [facility];  Service: Endoscopy;  Laterality: N/A;   APPENDECTOMY     AV FISTULA PLACEMENT Left 08/17/2019   Procedure: ARTERIOVENOUS (AV) FISTULA CREATION VERSES ARTERIOVENOUS GRAFT;  Surgeon: [name], MD;  Location: [place];  Service: Vascular;  Laterality: Left;   BASCILIC VEIN TRANSPOSITION Left 09/28/2019   Procedure: LEFT SECOND STAGE [place];  Surgeon: [name], MD;  Location: [place];  Service: Vascular;  Laterality: Left;   BIOPSY  02/03/2018   Procedure: BIOPSY;  Surgeon: Daneil Dolin, MD;  [facility];  Service: Endoscopy;;  bx of gastric polyps   BIOPSY  09/19/2019   Procedure: BIOPSY;  Surgeon: Danie Binder, MD;  [facility];  Service: Endoscopy;;   CATARACT EXTRACTION Right    COLONOSCOPY  03/10/2005   Rectal polyp as described above, removed with snare. Left sided  diverticula. The remainder of the colonic mucosa appeared normal. Inflammed polyp on path.   COLONOSCOPY  04/1999   Dr. Thea Silversmith polyps removed,  Path showed hyperplastic   COLONOSCOPY  10/2015   Dr. Britta Mccreedy: diverticulosis, single sessile tubular adenoma 3-4m in size removed from descending colon.    COLONOSCOPY WITH ESOPHAGOGASTRODUODENOSCOPY (EGD)  03/31/2012   RKNL:[id number]. Colonic diverticulosis. tubular adenoma colon   COLONOSCOPY WITH ESOPHAGOGASTRODUODENOSCOPY (EGD)  06/2019   FORSYTH: small esophageal varices without high risk stigmata, single large AVM on lesser curvature in gastric body s/p APC ablation, gastric antral and duodenal bulb polyposis. No significant source to explain transfusion dependent anemia. Colonoscopy with portal colopathy and diffuse edema, changes of Cdiff colitis on colonoscopy   ESOPHAGEAL BANDING N/A 02/03/2018   Procedure: ESOPHAGEAL BANDING;  Surgeon: RDaneil Dolin MD;  [facility];  Service:  Endoscopy;  Laterality: N/A;   ESOPHAGOGASTRODUODENOSCOPY  01/15/2005   Three columns grade 1 to 2 esophageal varices, otherwise normal esophageal mucosa.  Esophagus was not manipulated otherwise./Nodularity of the antrum with overlying erosions, nonspecific finding. Path showed rare H.pylori   ESOPHAGOGASTRODUODENOSCOPY  09/2008   Dr. RGaylene Brookscolumns of grade 2-3 esoph varices, only one column was prominent. Portal gastropathy, multiple gastrc polyps at antrum, two were 2cm with black eschar, bulbar polyps, bulbar erosions   ESOPHAGOGASTRODUODENOSCOPY  11/2004   Dr. FBrantley Stage3 esoph varices   ESOPHAGOGASTRODUODENOSCOPY  03/31/2012   RMR: 4 columns(3-GR 2, 1-GR1) non-bleeding esophageal varices, portal gastropathy, small HH, early GAVE, multiple gastric polyps    ESOPHAGOGASTRODUODENOSCOPY (EGD) WITH PROPOFOL N/A 02/03/2018   Dr. RGala Romney Esophageal varices, 3 columns grade 1-2.  Portal hypertensive gastropathy.  Multiple gastric polyps, biopsy consistent with hyperplastic.   ESOPHAGOGASTRODUODENOSCOPY (EGD) WITH PROPOFOL N/A 09/19/2019   Fields: grade I and II esophageal varcies, mild portal hypertensive gastropathy, moderate gastritis but no H. pylori, single hyperplastic gastric polyp removed, obvious source for melena/transfusion dependent anemia not identified, may be due to friable gastric and duodenal  mucosa in the setting of portal hypertension   GASTRIC VARICES BANDING  03/31/2012   Procedure: GASTRIC VARICES BANDING;  Surgeon: Daneil Dolin, MD;  [facility];  Service: Endoscopy;  Laterality: N/A;   GIVENS CAPSULE STUDY N/A 09/21/2019   Poor study with a lot of debris obstructing much of the view of the first approximate 3 hours out of 6 hours.  No obvious source of bleeding identified.  Sequela of bleeding and old blood seen in the form of"whisps" of blood, blood flecks mostly toward the end of the study.   IR REMOVAL TUN CV CATH W/O FL  02/15/2020   POLYPECTOMY   09/19/2019   Procedure: POLYPECTOMY;  Surgeon: Danie Binder, MD;  [facility];  Service: Endoscopy;;   UMBILICAL HERNIA REPAIR  2017   exploratory laparotomy, abdominal washout     SOCIAL HISTORY:  Social History   Socioeconomic History   Marital status: Married    Spouse name: Not on file   Number of children: 2   Years of education: Not on file   Highest education level: Not on file  Occupational History   Occupation: RETIRED    Employer: SELF EMPLOYED  Tobacco Use   Smoking status: Former    Packs/day: 2.00    Years: 25.00    Pack years: 50.00    Types: Cigarettes    Quit date: 01/28/1986    Years since quitting: 34.9   Smokeless tobacco: Never  Vaping Use   Vaping Use: Never used  Substance and Sexual Activity   Alcohol use: No    Comment: quit in 05/2013   Drug use: No   Sexual activity: Not Currently  Other Topics Concern   Not on file  Social History Narrative   Two step children from second marriage (divorced) who live with him along with some grandchildren.    Social Determinants of Health   Financial Resource Strain: Not on file  Food Insecurity: Not on file  Transportation Needs: Not on file  Physical Activity: Inactive   Days of Exercise per Week: 0 days   Minutes of Exercise per Session: 0 min  Stress: Not on file  Social Connections: Not on file  Intimate Partner Violence: Not At Risk   Fear of Current or Ex-Partner: No   Emotionally Abused: No   Physically Abused: No   Sexually Abused: No    FAMILY HISTORY:  Family History  Problem Relation Age of Onset   Colon cancer Father 3       deceased age 46   Breast cancer Sister        deceased   Diabetes Sister    Stroke Mother    Healthy Son    Healthy Daughter    Lung cancer Neg Hx    Ovarian cancer Neg Hx     CURRENT MEDICATIONS:  Outpatient Encounter Medications as of 01/13/2021  Medication Sig   acetaminophen (TYLENOL) 500 MG tablet Take 1,000 mg by mouth as needed  for mild pain.   Alcohol Swabs (ALCOHOL PREP) 70 % PADS Apply 1 each topically 3 (three) times daily.   ALPRAZolam (XANAX) 0.5 MG tablet Take 0.5 mg by mouth daily as needed.   Ascorbic Acid (VITAMIN C) 1000 MG tablet Take 1,000 mg by mouth 2 (two) times daily.   calcium carbonate (TUMS - DOSED IN MG ELEMENTAL CALCIUM) 500 MG chewable tablet Chew 1 tablet by mouth 3 (three) times daily with meals.   Calcium Carbonate-Simethicone (  ALKA-SELTZER HEARTBURN + GAS) 750-80 MG CHEW Chew by mouth as needed.   cholestyramine (QUESTRAN) 4 GM/DOSE powder SMARTSIG:1 Scoopful By Mouth Daily   diltiazem (TIAZAC) 120 MG 24 hr capsule Take 120 mg by mouth daily.   furosemide (LASIX) 40 MG tablet Take 80 mg by mouth daily.   gabapentin (NEURONTIN) 100 MG capsule TAKE 1 CAPSULE BY MOUTH AT BEDTIME   GLUCAGEN HYPOKIT 1 MG SOLR injection Inject into the muscle.   HUMALOG KWIKPEN 100 UNIT/ML KwikPen SMARTSIG:50 Unit(s) SUB-Q Twice Daily   hydrocortisone (ANUSOL-HC) 2.5 % rectal cream Place 1 application rectally 2 (two) times daily. (Patient taking differently: Place 1 application rectally 2 (two) times daily as needed for hemorrhoids or anal itching.)   hydrOXYzine (ATARAX/VISTARIL) 25 MG tablet Take 25 mg by mouth 3 (three) times daily as needed.   hydrOXYzine (VISTARIL) 25 MG capsule Take 25 mg by mouth 3 (three) times daily as needed for itching.    insulin degludec (TRESIBA FLEXTOUCH) 200 UNIT/ML FlexTouch Pen Inject 120 Units into the skin at bedtime. When blood sugars are up (Patient taking differently: Inject 120 Units into the skin at bedtime.)   iron polysaccharides (NIFEREX) 150 MG capsule Take 150 mg by mouth at bedtime.    lactulose (CONSTULOSE) 10 GM/15ML solution TAKE 45 MLS EVERY MORNING AND TAKE 45 MLS EVERY EVENING (Patient taking differently: TAKE 45 MLS EVERY MORNING AND TAKE 30-45 MLS EVERY EVENING)   Lancet Devices (ADJUSTABLE LANCING DEVICE) MISC Apply 1 each topically daily.   levofloxacin  (LEVAQUIN) 500 MG tablet Take 500 mg 3 times a week after dialysis.   metoprolol succinate (TOPROL-XL) 25 MG 24 hr tablet Take 1 tablet (25 mg total) by mouth daily. Take at bedtime  on Monday ,Wednesday and Friday due to dialaylisis (Patient taking differently: Take 25 mg by mouth See admin instructions. Take 25 mg by mouth at bedtime every night and 25 mg in morning on  Sun, Tues, Thurs and Sat)   midodrine (PROAMATINE) 10 MG tablet Take 1 tablet (10 mg total) by mouth 2 (two) times daily with a meal.   milk thistle 175 MG tablet Take 175 mg by mouth 3 (three) times daily.    mirtazapine (REMERON) 30 MG tablet Take 1 tablet (30 mg total) by mouth at bedtime.   Multiple Vitamin (MULTIVITAMIN WITH MINERALS) TABS tablet Take 1 tablet by mouth in the morning.    NOVOFINE 32G X 6 MM MISC Inject into the skin.   nystatin (MYCOSTATIN/NYSTOP) powder Apply topically 3 (three) times daily. Into groin area and affected area (Patient taking differently: Apply topically as needed. Into groin area and affected area)   Omega-3 1000 MG CAPS  ( 200 mg ), bid, # 120   omega-3 acid ethyl esters (LOVAZA) 1 g capsule Take 2 g by mouth 2 (two) times daily.    omeprazole (PRILOSEC) 40 MG capsule Take 40 mg by mouth daily.   ondansetron (ZOFRAN) 4 MG tablet Take 4 mg by mouth as needed for nausea or vomiting.   oxyCODONE-acetaminophen (PERCOCET/ROXICET) 5-325 MG tablet Take 1 tablet by mouth every 4 (four) hours as needed for moderate pain.   Ped Vitamins ACD Fl-Iron (TRI-VIT/FLUORIDE/IRON PO)  daily   polyethylene glycol-electrolytes (TRILYTE) 420 g solution Take 4,000 mLs by mouth as directed.   pravastatin (PRAVACHOL) 20 MG tablet Take 20 mg by mouth in the morning.    Probiotic Product (PROBIOTIC DAILY PO) Take 1 tablet by mouth in the morning.    propranolol (  INDERAL) 20 MG tablet Take by mouth.   spironolactone (ALDACTONE) 50 MG tablet Take 50 mg by mouth in the morning.    thiamine (VITAMIN B-1) 100 MG tablet  Take 1 tablet by mouth daily.   tiZANidine (ZANAFLEX) 2 MG tablet Take 2 mg by mouth 2 (two) times daily as needed for muscle spasms.    triamcinolone cream (KENALOG) 0.1 % Apply 1 application topically 2 (two) times daily as needed (dry skin Itching).    venlafaxine (EFFEXOR) 75 MG tablet Take 75 mg by mouth 3 (three) times daily with meals.    XIFAXAN 550 MG TABS tablet TAKE 1 TABLET BY MOUTH TWICE DAILY (Patient taking differently: Take 550 mg by mouth 2 (two) times daily.)   No facility-administered encounter medications on file as of 01/13/2021.    ALLERGIES:  Allergies  Allergen Reactions   Tape Itching and Rash     PHYSICAL EXAM:  ECOG PERFORMANCE STATUS: 2 - Symptomatic, <50% confined to bed  There were no vitals filed for this visit. There were no vitals filed for this visit. Physical Exam Constitutional:      Appearance: Normal appearance. He is obese.  HENT:     Head: Normocephalic and atraumatic.     Mouth/Throat:     Mouth: Mucous membranes are moist.  Eyes:     Extraocular Movements: Extraocular movements intact.     Pupils: Pupils are equal, round, and reactive to light.  Cardiovascular:     Rate and Rhythm: Normal rate and regular rhythm.     Pulses: Normal pulses.     Heart sounds: Murmur heard.  Pulmonary:     Effort: Pulmonary effort is normal.     Breath sounds: Normal breath sounds.  Abdominal:     General: Bowel sounds are normal. There is distension.     Palpations: Abdomen is soft.     Tenderness: There is no abdominal tenderness.  Musculoskeletal:        General: No swelling.     Right lower leg: Edema present.     Left lower leg: Edema present.  Lymphadenopathy:     Cervical: No cervical adenopathy.  Skin:    General: Skin is warm and dry.  Neurological:     General: No focal deficit present.     Mental Status: He is alert and oriented to person, place, and time.  Psychiatric:        Mood and Affect: Mood normal.        Behavior:  Behavior normal.     LABORATORY DATA:  I have reviewed the labs as listed.  CBC    Component Value Date/Time   WBC 2.7 (L) 12/16/2020 1015   RBC 2.64 (L) 12/16/2020 1015   HGB 8.4 (L) 12/16/2020 1015   HCT 26.6 (L) 12/16/2020 1015   HCT 28 02/25/2012 1034   PLT 85 (L) 12/16/2020 1015   PLT 125 02/25/2012 1034   MCV 100.8 (H) 12/16/2020 1015   MCV 82.0 02/25/2012 1034   MCH 31.8 12/16/2020 1015   MCHC 31.6 12/16/2020 1015   RDW 16.5 (H) 12/16/2020 1015   LYMPHSABS 0.2 (L) 12/16/2020 1015   MONOABS 0.2 12/16/2020 1015   EOSABS 0.3 12/16/2020 1015   BASOSABS 0.0 12/16/2020 1015   CMP Latest Ref Rng & Units 11/19/2020 11/07/2020 09/19/2020  Glucose 70 - 99 mg/dL 194(H) 149(H) -  BUN 8 - 23 mg/dL 25(H) 23 -  Creatinine 0.61 - 1.24 mg/dL 2.81(H) 2.56(H) -  Sodium 135 -  145 mmol/L 130(L) 131(L) -  Potassium 3.5 - 5.1 mmol/L 4.4 4.1 -  Chloride 98 - 111 mmol/L 94(L) 95(L) -  CO2 22 - 32 mmol/L 27 28 -  Calcium 8.9 - 10.3 mg/dL 8.2(L) 8.4(L) 8.3(L)  Total Protein 6.5 - 8.1 g/dL 6.2(L) 6.6 -  Total Bilirubin 0.3 - 1.2 mg/dL 0.7 0.7 -  Alkaline Phos 38 - 126 U/L 193(H) 202(H) -  AST 15 - 41 U/L 21 23 -  ALT 0 - 44 U/L 17 19 -    DIAGNOSTIC IMAGING:  I have independently reviewed the relevant imaging and discussed with the patient.  ASSESSMENT & PLAN: 1.  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  Due to his overall fragile state of health with poor prognosis, supportive care remains his best treatment option; he would not be a candidate for any chemotherapeutic options. - PLAN: Blood counts are stable at baseline.  We will defer bone  marrow biopsy at this time.  Further plan as below.  2.  Macrocytic anemia, transfusion-dependent - Anemia is related to his ESRD (receives Epogen at dialysis), end-stage liver disease/cirrhosis, and intermittent GI bleeding.  He is now transfusion dependent, and has been receiving PRBC transfusions on average every 4 weeks. - Intermittent melena with Hemoccult positive stool and extensive work-up for gastrointestinal blood loss: EGD (09/19/2019): Grade 1 and grade 2 esophageal varices without stigmata of recent bleeding, mild portal hypertensive gastropathy, moderate gastritis, gastric polyp - " no obvious source for melena and transfusion dependent anemia identified, may be due to friable gastric and duodenal mucosa in the setting of portal hypertension" Capsule endoscopy in April 2021 was unrevealing Most recent colonoscopy (11/29/2015): Mild diverticulosis, polyp x1 Scheduled for upcoming EGD/colonoscopy in August 2022 Stool occult blood positive x3 (08/15/2020) - Work-up with SPEP and immunofixation was negative. Hemolysis labs were negative with normal LDH and reticulocyte count. - Macrocytosis secondary to cirrhotic liver disease - Most recent IV iron (Venofer) June/July 2022 - Receives Epogen at dialysis - Last PRBC transfusion x1 on 11/26/2020 - Most recent labs (01/13/2021): Hgb 9.1, MCV 103.9, ferritin 513, iron saturation 21% - Denies any current melena or bright red blood per rectum - PLAN: No indication for PRBC transfusion or IV iron at this time.  Continue CBC with sample to blood bank every 4 weeks.  PRBC transfusion every 4 weeks for Hgb < 7.0 (or Hgb <8.0 with symptoms).  RTC in 3 months with full lab panel before visit.   3.  Thrombocytopenia and leukopenia -Thrombocytopenia and leukopenia due to are thought to be secondary to his chronic liver disease and splenomegaly. -CTAP on 04/01/2020 shows persistent splenomegaly - Most recent labs (01/13/2021): WBC 3.6, platelets 96; no B12  or folate deficiencies on today's labs - Patient denies frequent infections.  Reports easy bruising but denies significant bleeding events.   - PLAN: Continue to monitor with repeat CBCs.  4.  B12 deficiency - Anemia mildly macrocytic (in part due to liver disease) - Noted at last appointment to have persistent B12 deficiency despite oral repletion, and was therefore switched to injections - Patient has received monthly B12 injections x2 so far (11/26/2020 and 12/17/2020) -  Labs today (01/13/2021): B12 1045, methylmalonic acid pending - PLAN: Continue monthly B12 injections.  Reassess in 3 months with repeat labs.   5.  End-stage renal disease: -He gets dialysis Monday-Wednesday-Friday -He receives Epogen injections at his dialysis.   6. Cirrhosis / End-stage Liver Disease - Receives paracentesis every 2 weeks - Follows regularly with gastroenterology  PLAN SUMMARY & DISPOSITION: - No IV iron or transfusion at this appointment - Continue CBC with sample to blood bank every 4 weeks, transfuse if indicated - B12 injection every 4 weeks - Full lab panel and office visit in 3 months  All questions were answered. The patient knows to call the clinic with any problems, questions or concerns.  Medical decision making: Moderate  Time spent on visit: I spent 20 minutes counseling the patient face to face. The total time spent in the appointment was 30 minutes and more than 50% was on counseling.   Harriett Rush, PA-C  01/13/2021 12:28 PM

## 2021-01-13 ENCOUNTER — Inpatient Hospital Stay (HOSPITAL_COMMUNITY): Payer: Medicare Other | Attending: Physician Assistant | Admitting: Physician Assistant

## 2021-01-13 ENCOUNTER — Inpatient Hospital Stay (HOSPITAL_COMMUNITY): Payer: Medicare Other

## 2021-01-13 ENCOUNTER — Other Ambulatory Visit: Payer: Self-pay

## 2021-01-13 VITALS — BP 130/51 | HR 60 | Temp 98.5°F | Resp 16

## 2021-01-13 DIAGNOSIS — D61818 Other pancytopenia: Secondary | ICD-10-CM | POA: Diagnosis not present

## 2021-01-13 DIAGNOSIS — E538 Deficiency of other specified B group vitamins: Secondary | ICD-10-CM | POA: Diagnosis present

## 2021-01-13 DIAGNOSIS — D500 Iron deficiency anemia secondary to blood loss (chronic): Secondary | ICD-10-CM | POA: Diagnosis not present

## 2021-01-13 DIAGNOSIS — D539 Nutritional anemia, unspecified: Secondary | ICD-10-CM | POA: Insufficient documentation

## 2021-01-13 LAB — CBC WITH DIFFERENTIAL/PLATELET
Abs Immature Granulocytes: 0.02 10*3/uL (ref 0.00–0.07)
Basophils Absolute: 0 10*3/uL (ref 0.0–0.1)
Basophils Relative: 1 %
Eosinophils Absolute: 0.3 10*3/uL (ref 0.0–0.5)
Eosinophils Relative: 8 %
HCT: 29.3 % — ABNORMAL LOW (ref 39.0–52.0)
Hemoglobin: 9.1 g/dL — ABNORMAL LOW (ref 13.0–17.0)
Immature Granulocytes: 1 %
Lymphocytes Relative: 10 %
Lymphs Abs: 0.4 10*3/uL — ABNORMAL LOW (ref 0.7–4.0)
MCH: 32.3 pg (ref 26.0–34.0)
MCHC: 31.1 g/dL (ref 30.0–36.0)
MCV: 103.9 fL — ABNORMAL HIGH (ref 80.0–100.0)
Monocytes Absolute: 0.2 10*3/uL (ref 0.1–1.0)
Monocytes Relative: 6 %
Neutro Abs: 2.7 10*3/uL (ref 1.7–7.7)
Neutrophils Relative %: 74 %
Platelets: 96 10*3/uL — ABNORMAL LOW (ref 150–400)
RBC: 2.82 MIL/uL — ABNORMAL LOW (ref 4.22–5.81)
RDW: 17.6 % — ABNORMAL HIGH (ref 11.5–15.5)
WBC: 3.6 10*3/uL — ABNORMAL LOW (ref 4.0–10.5)
nRBC: 0 % (ref 0.0–0.2)

## 2021-01-13 LAB — FERRITIN: Ferritin: 513 ng/mL — ABNORMAL HIGH (ref 24–336)

## 2021-01-13 LAB — FOLATE: Folate: 36.1 ng/mL (ref >5.9)

## 2021-01-13 LAB — SAMPLE TO BLOOD BANK

## 2021-01-13 LAB — IRON AND TIBC
Iron: 52 ug/dL (ref 45–182)
Saturation Ratios: 21 % (ref 17.9–39.5)
TIBC: 250 ug/dL (ref 250–450)
UIBC: 198 ug/dL

## 2021-01-13 LAB — VITAMIN B12: Vitamin B-12: 1045 pg/mL — ABNORMAL HIGH (ref 180–914)

## 2021-01-13 LAB — VITAMIN D 25 HYDROXY (VIT D DEFICIENCY, FRACTURES): Vit D, 25-Hydroxy: 60.65 ng/mL (ref 30–100)

## 2021-01-13 MED ORDER — CYANOCOBALAMIN 1000 MCG/ML IJ SOLN
1000.0000 ug | Freq: Once | INTRAMUSCULAR | Status: AC
  Administered 2021-01-13: 1000 ug via INTRAMUSCULAR
  Filled 2021-01-13: qty 1

## 2021-01-13 NOTE — Patient Instructions (Signed)
B 12 injection given in r deltoid.  Tolerated well and site clean dry and intact.  Discharged to home in stable condition in wheelchair.

## 2021-01-13 NOTE — Progress Notes (Signed)
B 12 given in right deltoid.  Site clean dry and intact.  No complications noted.  D/C to home via wheelchair in stable condition accompanied by spouse.

## 2021-01-13 NOTE — Patient Instructions (Signed)
[place] at [facility] Discharge Instructions  You were seen today by Tarri Abernethy PA-C for your low blood counts.  As we have discussed, your low white blood cells and platelets is related to your liver disease.  Your white blood cells and platelets remain stable within their baseline range.  Unless there are any large changes, we will not need to actively treat your low white blood cells or low platelets.  We will continue to monitor them though with repeat labs.  Your low red blood cell/low hemoglobin is related to your chronic kidney disease as well as suspected GI blood loss.  Your blood counts today are improved (hemoglobin 9.1), so you do not need any blood transfusion this week.  Your iron labs are pending, but we will call you if they are low and if you need any IV iron infusions.  Otherwise, continue to check CBC every 4 weeks to see if you need any blood transfusions.  LABS: Return every 4 weeks for labs  OTHER TESTS: None at this time  MEDICATIONS: No changes to your home medications  FOLLOW-UP APPOINTMENT: Office visit in 3 months   Thank you for choosing [place] at [facility] to provide your oncology and hematology care.  To afford each patient quality time with our provider, please arrive at least 15 minutes before your scheduled appointment time.   If you have a lab appointment with the [facility] please come in thru the Main Entrance and check in at the main information desk.  You need to re-schedule your appointment should you arrive 10 or more minutes late.  We strive to give you quality time with our providers, and arriving late affects you and other patients whose appointments are after yours.  Also, if you no show three or more times for appointments you may be dismissed from the clinic at the providers discretion.     Again, thank you for choosing [facility].  Our hope is that these requests will  decrease the amount of time that you wait before being seen by our physicians.       _____________________________________________________________  Should you have questions after your visit to [facility], please contact our office at [phone] and follow the prompts.  Our office hours are 8:00 a.m. and 4:30 p.m. Monday - Friday.  Please note that voicemails left after 4:00 p.m. may not be returned until the following business day.  We are closed weekends and major holidays.  You do have access to a nurse 24-7, just call the main number to the clinic [phone] and do not press any options, hold on the line and a nurse will answer the phone.    For prescription refill requests, have your pharmacy contact our office and allow 72 hours.    Due to Covid, you will need to wear a mask upon entering the hospital. If you do not have a mask, a mask will be given to you at the Main Entrance upon arrival. For doctor visits, patients may have 1 support person age 60 or older with them. For treatment visits, patients can not have anyone with them due to social distancing guidelines and our immunocompromised population.

## 2021-01-14 ENCOUNTER — Encounter (HOSPITAL_COMMUNITY): Payer: Medicare Other

## 2021-01-14 ENCOUNTER — Encounter (HOSPITAL_COMMUNITY): Payer: Self-pay

## 2021-01-14 ENCOUNTER — Other Ambulatory Visit: Payer: Self-pay

## 2021-01-14 ENCOUNTER — Encounter (HOSPITAL_COMMUNITY)
Admission: RE | Admit: 2021-01-14 | Discharge: 2021-01-14 | Disposition: A | Discharge status: Home / Self Care | Payer: Medicare Other | Source: Ambulatory Visit | Attending: Internal Medicine | Admitting: Internal Medicine

## 2021-01-14 NOTE — Pre-Procedure Instructions (Signed)
Called patient to do pre-op assessment. He asks [place] to call his wife, [name]. "She knows all this stuff and handles everything". I called Christoph Copelan at [phone] and did preop assessment and went over all instructions with her. She verbalized understanding. I gave her the preop number to call [phone] if she has questions or concerns.

## 2021-01-15 LAB — METHYLMALONIC ACID, SERUM: Methylmalonic Acid, Quantitative: 270 nmol/L (ref 0–378)

## 2021-01-16 ENCOUNTER — Telehealth: Payer: Self-pay | Admitting: Internal Medicine

## 2021-01-16 ENCOUNTER — Ambulatory Visit (HOSPITAL_BASED_OUTPATIENT_CLINIC_OR_DEPARTMENT_OTHER)
Admission: RE | Admit: 2021-01-16 | Discharge: 2021-01-16 | Disposition: A | Discharge status: Home / Self Care | Payer: Medicare Other | Source: Home / Self Care | Attending: Internal Medicine | Admitting: Internal Medicine

## 2021-01-16 ENCOUNTER — Ambulatory Visit (HOSPITAL_COMMUNITY): Payer: Medicare Other | Admitting: Anesthesiology

## 2021-01-16 ENCOUNTER — Encounter (HOSPITAL_COMMUNITY): Payer: Self-pay | Admitting: Internal Medicine

## 2021-01-16 ENCOUNTER — Other Ambulatory Visit: Payer: Self-pay

## 2021-01-16 ENCOUNTER — Encounter (HOSPITAL_COMMUNITY)
Admission: RE | Disposition: A | Discharge status: Home / Self Care | Payer: Self-pay | Source: Home / Self Care | Attending: Internal Medicine

## 2021-01-16 DIAGNOSIS — K766 Portal hypertension: Secondary | ICD-10-CM | POA: Insufficient documentation

## 2021-01-16 DIAGNOSIS — K635 Polyp of colon: Secondary | ICD-10-CM | POA: Insufficient documentation

## 2021-01-16 DIAGNOSIS — Z992 Dependence on renal dialysis: Secondary | ICD-10-CM | POA: Insufficient documentation

## 2021-01-16 DIAGNOSIS — E1122 Type 2 diabetes mellitus with diabetic chronic kidney disease: Secondary | ICD-10-CM | POA: Insufficient documentation

## 2021-01-16 DIAGNOSIS — K91840 Postprocedural hemorrhage and hematoma of a digestive system organ or structure following a digestive system procedure: Secondary | ICD-10-CM | POA: Diagnosis not present

## 2021-01-16 DIAGNOSIS — N186 End stage renal disease: Secondary | ICD-10-CM | POA: Insufficient documentation

## 2021-01-16 DIAGNOSIS — I868 Varicose veins of other specified sites: Secondary | ICD-10-CM | POA: Insufficient documentation

## 2021-01-16 DIAGNOSIS — R195 Other fecal abnormalities: Secondary | ICD-10-CM

## 2021-01-16 DIAGNOSIS — K7460 Unspecified cirrhosis of liver: Secondary | ICD-10-CM | POA: Insufficient documentation

## 2021-01-16 DIAGNOSIS — I8501 Esophageal varices with bleeding: Secondary | ICD-10-CM | POA: Insufficient documentation

## 2021-01-16 DIAGNOSIS — I8510 Secondary esophageal varices without bleeding: Secondary | ICD-10-CM

## 2021-01-16 DIAGNOSIS — K625 Hemorrhage of anus and rectum: Secondary | ICD-10-CM | POA: Diagnosis not present

## 2021-01-16 DIAGNOSIS — K5730 Diverticulosis of large intestine without perforation or abscess without bleeding: Secondary | ICD-10-CM | POA: Insufficient documentation

## 2021-01-16 DIAGNOSIS — K3189 Other diseases of stomach and duodenum: Secondary | ICD-10-CM | POA: Insufficient documentation

## 2021-01-16 DIAGNOSIS — H9190 Unspecified hearing loss, unspecified ear: Secondary | ICD-10-CM | POA: Insufficient documentation

## 2021-01-16 DIAGNOSIS — I120 Hypertensive chronic kidney disease with stage 5 chronic kidney disease or end stage renal disease: Secondary | ICD-10-CM | POA: Insufficient documentation

## 2021-01-16 HISTORY — PX: ESOPHAGEAL BANDING: SHX5518

## 2021-01-16 HISTORY — PX: BIOPSY: SHX5522

## 2021-01-16 HISTORY — PX: ESOPHAGOGASTRODUODENOSCOPY (EGD) WITH PROPOFOL: SHX5813

## 2021-01-16 HISTORY — PX: COLONOSCOPY WITH PROPOFOL: SHX5780

## 2021-01-16 LAB — POCT I-STAT, CHEM 8
BUN: 21 mg/dL (ref 8–23)
Calcium, Ion: 0.88 mmol/L — CL (ref 1.15–1.40)
Chloride: 93 mmol/L — ABNORMAL LOW (ref 98–111)
Creatinine, Ser: 2.8 mg/dL — ABNORMAL HIGH (ref 0.61–1.24)
Glucose, Bld: 136 mg/dL — ABNORMAL HIGH (ref 70–99)
HCT: 30 % — ABNORMAL LOW (ref 39.0–52.0)
Hemoglobin: 10.2 g/dL — ABNORMAL LOW (ref 13.0–17.0)
Potassium: 4.9 mmol/L (ref 3.5–5.1)
Sodium: 126 mmol/L — ABNORMAL LOW (ref 135–145)
TCO2: 26 mmol/L (ref 22–32)

## 2021-01-16 LAB — COPPER, SERUM: Copper: 153 ug/dL — ABNORMAL HIGH (ref 69–132)

## 2021-01-16 SURGERY — COLONOSCOPY WITH PROPOFOL
Anesthesia: General

## 2021-01-16 MED ORDER — LACTATED RINGERS IV SOLN: INTRAVENOUS | Status: DC

## 2021-01-16 MED ORDER — KETAMINE HCL 50 MG/5ML IJ SOSY
PREFILLED_SYRINGE | INTRAMUSCULAR | Status: AC
  Filled 2021-01-16: qty 5

## 2021-01-16 MED ORDER — PROPOFOL 10 MG/ML IV BOLUS
INTRAVENOUS | Status: DC | PRN
  Administered 2021-01-16 (×2): 30 mg via INTRAVENOUS
  Administered 2021-01-16: 75 ug/kg/min via INTRAVENOUS

## 2021-01-16 MED ORDER — KETAMINE HCL 10 MG/ML IJ SOLN
INTRAMUSCULAR | Status: DC | PRN
  Administered 2021-01-16: 10 mg via INTRAVENOUS
  Administered 2021-01-16: 30 mg via INTRAVENOUS
  Administered 2021-01-16: 10 mg via INTRAVENOUS

## 2021-01-16 MED ORDER — LIDOCAINE HCL (CARDIAC) PF 100 MG/5ML IV SOSY
PREFILLED_SYRINGE | INTRAVENOUS | Status: DC | PRN
  Administered 2021-01-16: 100 mg via INTRAVENOUS

## 2021-01-16 NOTE — Op Note (Addendum)
[name] Patient Name: Jeffrey [name] Procedure Date: 01/16/2021 10:04 AM MRN: [id number] Date of Birth: 11/03/1945 Attending MD: Norvel Richards , MD CSN: [id number] Age: 75 Admit Type: Outpatient Procedure:                Colonoscopy Indications:              Heme positive stool Providers:                Norvel Richards, MD, Janeece Riggers, RN, Dereck Leep, Technician Referring MD:              Medicines:                Propofol per Anesthesia Complications:            No immediate complications. Estimated Blood Loss:     10 to 15 cc Procedure:                Pre-Anesthesia Assessment:                           - Prior to the procedure, a History and Physical                            was performed, and patient medications and                            allergies were reviewed. The patient's tolerance of                            previous anesthesia was also reviewed. The risks                            and benefits of the procedure and the sedation                            options and risks were discussed with the patient.                            All questions were answered, and informed consent                            was obtained. Prior Anticoagulants: The patient has                            taken no previous anticoagulant or antiplatelet                            agents. ASA Grade Assessment: IV - A patient with                            severe systemic disease that is a constant threat  to life. After reviewing the risks and benefits,                            the patient was deemed in satisfactory condition to                            undergo the procedure.                           After obtaining informed consent, the colonoscope                            was passed under direct vision. Throughout the                            procedure, the patient's blood pressure, pulse, and                             oxygen saturations were monitored continuously. The                            [id number]) scope was introduced through the                            anus and advanced to the the cecum, identified by                            appendiceal orifice and ileocecal valve. The                            colonoscopy was performed without difficulty. The                            patient tolerated the procedure well. The ileocecal                            valve, appendiceal orifice, and rectum were                            photographed. Scope In: 10:08:11 AM Scope Out: 10:29:59 AM Scope Withdrawal Time: 0 hours 16 minutes 4 seconds  Total Procedure Duration: 0 hours 21 minutes 48 seconds  Findings:      The perianal and digital rectal examinations were normal. Patient has       large multilobulated rectal varices. Please see photos. Diffuse colonic       mucosal changes consistent with portal colopathy, left-sided diverticula       present. (2) 1 cm pedunculated polyps -1 in the sigmoid and 1 in the       descending segment. At this splenic flexure, there was a 3 x 4 cm       polypoid lesion. It was soft. It was friable and easily bled. Please see       multiple photographs. It was biopsied. Biopsy associated with 10 to 15       cc of blood loss.  I elected not to attempt removal of the descending and sigmoid polyps. Impression:               - Portal colopathy. Large rectal varices.                           - Polypoid mass at the splenic flexure biopsied.                           - Left colon polyps as described above?not removed.                            Left-sided diverticulosis.                           - No doubt patient has been bleeding from the                            splenic flexure lesion. Findings on EGD concerning                            for variceal hemorrhage as well. Moderate Sedation:      Moderate (conscious) sedation was  personally administered by an       anesthesia professional. The following parameters were monitored: oxygen       saturation, heart rate, blood pressure, respiratory rate, EKG, adequacy       of pulmonary ventilation, and response to care. Recommendation:           - Patient has a contact number available for                            emergencies. The signs and symptoms of potential                            delayed complications were discussed with the                            patient. Return to normal activities tomorrow.                            Written discharge instructions were provided to the                            patient.                           - Clear liquid diet; soft diet tomorrow; then                            advance thereafter. Follow-up on pathology. Follow                            H&H. See EGD report. Follow H&H. Palliative/hospice                            consultation recommended.  He is not a surgical                            candidate. 38-month mortality greater than 50%. We                            will consider reassessment of esophageal varices in                            1 month depending on clinical course. I spoke to                            wife in person about my endoscopic findings today.                            Overall prognosis very poor. Ongoing care with                            hospice/palliative care should be continued. Procedure Code(s):        --- Professional ---                           [phone], Colonoscopy, flexible; diagnostic, including                            collection of specimen(s) by brushing or washing,                            when performed (separate procedure) Diagnosis Code(s):        --- Professional ---                           R19.5, Other fecal abnormalities CPT copyright 2019 [employer]. All rights reserved. The codes documented in this report are preliminary and upon coder  review may  be revised to meet current compliance requirements. [name]. [name], MD Norvel Richards, MD 01/16/2021 10:43:43 AM This report has been signed electronically. Number of Addenda: 0

## 2021-01-16 NOTE — Anesthesia Postprocedure Evaluation (Signed)
Anesthesia Post Note  Patient: Jeffrey [name]  Procedure(s) Performed: COLONOSCOPY WITH PROPOFOL ESOPHAGOGASTRODUODENOSCOPY (EGD) WITH PROPOFOL ESOPHAGEAL BANDING BIOPSY  Patient location during evaluation: Phase II Anesthesia Type: General Level of consciousness: awake and alert and oriented Pain management: pain level controlled Vital Signs Assessment: post-procedure vital signs reviewed and stable Respiratory status: spontaneous breathing and respiratory function stable Cardiovascular status: blood pressure returned to baseline and stable Postop Assessment: no apparent nausea or vomiting Anesthetic complications: no   No notable events documented.   Last Vitals:  Vitals:   01/16/21 1212 01/16/21 1215  BP: (!) (P) 148/71 (!) 144/58  Pulse: (P) 78 78  Resp: (P) 16 16  Temp:  36.7 C  SpO2: (P) 99% 100%    Last Pain:  Vitals:   01/16/21 1215  TempSrc: Oral  PainSc: 0-No pain                 Jacere Pangborn C Margeart Allender

## 2021-01-16 NOTE — H&P (Signed)
@LOGO @   Primary Care Physician:  Monico Blitz, MD Primary Gastroenterologist:  Dr. Gala Romney  Pre-Procedure History & Physical: HPI:  Jeffrey [name] is a 75 y.o. male with a complicated history including end-stage alcoholic cirrhosis, chronic kidney disease on hemodialysis diabetes history of duodenal ulcer GERD SBP recurrent ascites/anasarca who presents for further evaluation of vague upper abdominal pain and ongoing intermittent bleeding of obscure etiology extensive evaluation previously including EGD capsule and colonoscopy.  He has known grade 1-2 esophageal varices no prior banding.  He is not a beta-blocker candidate due to history of SBP. Markedly elevated MELD at 28. Past Medical History:  Diagnosis Date   A-fib [facility])    when initially stating dialysis january 2021 at [facility] per spouse; never heard anything else about it   Alcoholic cirrhosis [facility])    patient reports completing hep a and b vaccines in 2006   Anemia    has had 3 units of prbcs 2013   Anxiety    Arthritis    Asymptomatic gallstones    Ultrasound in 2006   B12 deficiency 11/21/2020   C. difficile colitis    Cholelithiasis    Chronic kidney disease    Dialysis M/W/F/Sa   Depression    Diabetes ([place])    Duodenal ulcer with hemorrhage    per patient in 2006 or 2007, records have been requested   Dyspnea    low oxygen level -  has O2 2 L all day   Esophageal varices ([place])    see PSH   GERD (gastroesophageal reflux disease)    Heart burn    History of alcohol abuse    quit 05/2013   HOH (hard of hearing)    Hypertension    Liver cirrhosis ([place])    SBP (spontaneous bacterial peritonitis) ([place])    2020, November 2021.    Splenomegaly    Ultrasound in 2006    Past Surgical History:  Procedure Laterality Date   AGILE CAPSULE N/A 09/20/2019   Procedure: AGILE CAPSULE;  Surgeon: Daneil Dolin, MD;  [facility];  Service: Endoscopy;  Laterality: N/A;   APPENDECTOMY     AV FISTULA  PLACEMENT Left 08/17/2019   Procedure: ARTERIOVENOUS (AV) FISTULA CREATION VERSES ARTERIOVENOUS GRAFT;  Surgeon: [name], MD;  Location: [place];  Service: Vascular;  Laterality: Left;   BASCILIC VEIN TRANSPOSITION Left 09/28/2019   Procedure: LEFT SECOND STAGE [place];  Surgeon: [name], MD;  Location: [place];  Service: Vascular;  Laterality: Left;   BIOPSY  02/03/2018   Procedure: BIOPSY;  Surgeon: Daneil Dolin, MD;  [facility];  Service: Endoscopy;;  bx of gastric polyps   BIOPSY  09/19/2019   Procedure: BIOPSY;  Surgeon: Danie Binder, MD;  [facility];  Service: Endoscopy;;   CATARACT EXTRACTION Right    COLONOSCOPY  03/10/2005   Rectal polyp as described above, removed with snare. Left sided  diverticula. The remainder of the colonic mucosa appeared normal. Inflammed polyp on path.   COLONOSCOPY  04/1999   Dr. Thea Silversmith polyps removed,  Path showed hyperplastic   COLONOSCOPY  10/2015   Dr. Britta Mccreedy: diverticulosis, single sessile tubular adenoma 3-37mm in size removed from descending colon.    COLONOSCOPY WITH ESOPHAGOGASTRODUODENOSCOPY (EGD)  03/31/2012   VOZ:DGUYQIH AVMs. Colonic diverticulosis. tubular adenoma colon   COLONOSCOPY WITH ESOPHAGOGASTRODUODENOSCOPY (EGD)  06/2019   FORSYTH: small esophageal varices without high risk stigmata, single large AVM on  lesser curvature in gastric body s/p APC ablation, gastric antral and duodenal bulb polyposis. No significant source to explain transfusion dependent anemia. Colonoscopy with portal colopathy and diffuse edema, changes of Cdiff colitis on colonoscopy   ESOPHAGEAL BANDING N/A 02/03/2018   Procedure: ESOPHAGEAL BANDING;  Surgeon: Daneil Dolin, MD;  [facility];  Service: Endoscopy;  Laterality: N/A;   ESOPHAGOGASTRODUODENOSCOPY  01/15/2005   Three columns grade 1 to 2 esophageal varices, otherwise normal esophageal mucosa.  Esophagus was not manipulated  otherwise./Nodularity of the antrum with overlying erosions, nonspecific finding. Path showed rare H.pylori   ESOPHAGOGASTRODUODENOSCOPY  09/2008   Dr. Gaylene Brooks columns of grade 2-3 esoph varices, only one column was prominent. Portal gastropathy, multiple gastrc polyps at antrum, two were 2cm with black eschar, bulbar polyps, bulbar erosions   ESOPHAGOGASTRODUODENOSCOPY  11/2004   Dr. Brantley Stage 3 esoph varices   ESOPHAGOGASTRODUODENOSCOPY  03/31/2012   RMR: 4 columns(3-GR 2, 1-GR1) non-bleeding esophageal varices, portal gastropathy, small HH, early GAVE, multiple gastric polyps    ESOPHAGOGASTRODUODENOSCOPY (EGD) WITH PROPOFOL N/A 02/03/2018   Dr. Gala Romney: Esophageal varices, 3 columns grade 1-2.  Portal hypertensive gastropathy.  Multiple gastric polyps, biopsy consistent with hyperplastic.   ESOPHAGOGASTRODUODENOSCOPY (EGD) WITH PROPOFOL N/A 09/19/2019   Fields: grade I and II esophageal varcies, mild portal hypertensive gastropathy, moderate gastritis but no H. pylori, single hyperplastic gastric polyp removed, obvious source for melena/transfusion dependent anemia not identified, may be due to friable gastric and duodenal mucosa in the setting of portal hypertension   GASTRIC VARICES BANDING  03/31/2012   Procedure: GASTRIC VARICES BANDING;  Surgeon: Daneil Dolin, MD;  [facility];  Service: Endoscopy;  Laterality: N/A;   GIVENS CAPSULE STUDY N/A 09/21/2019   Poor study with a lot of debris obstructing much of the view of the first approximate 3 hours out of 6 hours.  No obvious source of bleeding identified.  Sequela of bleeding and old blood seen in the form of"whisps" of blood, blood flecks mostly toward the end of the study.   IR REMOVAL TUN CV CATH W/O FL  02/15/2020   POLYPECTOMY  09/19/2019   Procedure: POLYPECTOMY;  Surgeon: Danie Binder, MD;  [facility];  Service: Endoscopy;;   UMBILICAL HERNIA REPAIR  2017   exploratory laparotomy, abdominal washout     Prior to Admission medications   Medication Sig Start Date End Date Taking? Authorizing Provider  acetaminophen (TYLENOL) 650 MG CR tablet Take 650 mg by mouth every 8 (eight) hours as needed for pain.   Yes [provider]  ALPRAZolam Duanne Moron) 0.5 MG tablet Take 0.5 mg by mouth daily as needed for anxiety. 09/21/20  Yes [provider]  Ascorbic Acid (VITAMIN C) 1000 MG tablet Take 1,000 mg by mouth in the morning.   Yes [provider]  calcium carbonate (TUMS - DOSED IN MG ELEMENTAL CALCIUM) 500 MG chewable tablet Chew 1 tablet by mouth 3 (three) times daily with meals.   Yes [provider]  Calcium Carbonate-Simethicone (ALKA-SELTZER HEARTBURN + GAS) 750-80 MG CHEW Chew 1-2 tablets by mouth 2 (two) times daily as needed (acid reflux).   Yes [provider]  diltiazem (TIAZAC) 120 MG 24 hr capsule Take 120 mg by mouth daily. 05/28/20  Yes [provider]  furosemide (LASIX) 40 MG tablet Take 80 mg by mouth daily. 12/29/19  Yes [provider]  gabapentin (NEURONTIN) 100 MG capsule TAKE 1 CAPSULE BY MOUTH AT BEDTIME Patient taking  differently: Take 100 mg by mouth daily with supper. 05/28/20  Yes Waynetta Sandy, MD  GLUCAGEN HYPOKIT 1 MG SOLR injection Inject 1 mg into the muscle once as needed for low blood sugar. 11/28/20  Yes [provider]  HUMALOG KWIKPEN 100 UNIT/ML KwikPen Inject 10-50 Units into the skin 3 (three) times daily. 10/24/20  Yes [provider]  hydrOXYzine (ATARAX/VISTARIL) 25 MG tablet Take 25 mg by mouth 3 (three) times daily. 11/23/20  Yes [provider]  insulin degludec (TRESIBA FLEXTOUCH) 200 UNIT/ML FlexTouch Pen Inject 120 Units into the skin at bedtime. When blood sugars are up 02/20/20  Yes Barton Dubois, MD  lactulose (CONSTULOSE) 10 GM/15ML solution TAKE 45 MLS EVERY MORNING AND TAKE 45 MLS EVERY EVENING 07/16/20  Yes Mahala Menghini, PA-C  levofloxacin (LEVAQUIN)  500 MG tablet Take 500 mg 3 times a week after dialysis. 04/09/20  Yes Sallyanne Birkhead, [name], MD  metoprolol succinate (TOPROL-XL) 25 MG 24 hr tablet Take 1 tablet (25 mg total) by mouth daily. Take at bedtime  on Monday ,Wednesday and Friday due to dialaylisis Patient taking differently: Take 25 mg by mouth See admin instructions. Take 25 mg by mouth at bedtime every night and 25 mg in morning on  Sun, Tues, Thurs and Sat 01/06/20  Yes Barton Dubois, MD  midodrine (PROAMATINE) 10 MG tablet Take 1 tablet (10 mg total) by mouth 2 (two) times daily with a meal. 04/06/20  Yes Tat, Shanon Brow, MD  milk thistle 175 MG tablet Take 175 mg by mouth 3 (three) times daily.    Yes [provider]  mirtazapine (REMERON) 30 MG tablet Take 1 tablet (30 mg total) by mouth at bedtime. Patient taking differently: Take 30 mg by mouth daily with supper. 02/10/20  Yes Roxan Hockey, MD  Multiple Vitamin (MULTIVITAMIN WITH MINERALS) TABS tablet Take 1 tablet by mouth in the morning.    Yes [provider]  NOVOFINE 32G X 6 MM MISC Inject into the skin. 01/31/20  Yes [provider]  nystatin (MYCOSTATIN/NYSTOP) powder Apply topically 3 (three) times daily. Into groin area and affected area Patient taking differently: Apply 1 application topically as needed. Into groin area and affected area 01/06/20  Yes Barton Dubois, MD  omega-3 acid ethyl esters (LOVAZA) 1 g capsule Take 2 g by mouth 2 (two) times daily.  04/07/19  Yes [provider]  omeprazole (PRILOSEC) 40 MG capsule Take 40 mg by mouth daily.   Yes [provider]  ondansetron (ZOFRAN) 4 MG tablet Take 4 mg by mouth as needed for nausea or vomiting. Patient not taking: Reported on 01/13/2021   Yes [provider]  oxyCODONE-acetaminophen (PERCOCET/ROXICET) 5-325 MG tablet Take 1 tablet by mouth every 4 (four) hours as needed for moderate pain. 02/27/20  Yes [provider]  Ped Vitamins ACD Fl-Iron  (TRI-VIT/FLUORIDE/IRON PO) Take 1 tablet by mouth daily. 08/01/19  Yes [provider]  polyethylene glycol-electrolytes (TRILYTE) 420 g solution Take 4,000 mLs by mouth as directed. 11/19/20  Yes Roxsana Riding, [name], MD  pravastatin (PRAVACHOL) 20 MG tablet Take 20 mg by mouth in the morning.    Yes [provider]  Probiotic Product (PROBIOTIC DAILY PO) Take 1 tablet by mouth in the morning.    Yes [provider]  spironolactone (ALDACTONE) 50 MG tablet Take 50 mg by mouth in the morning.    Yes [provider]  thiamine (VITAMIN B-1) 100 MG tablet Take 100 mg by  mouth in the morning. 08/27/20  Yes [provider]  tiZANidine (ZANAFLEX) 2 MG tablet Take 2 mg by mouth 2 (two) times daily as needed for muscle spasms.  02/29/20  Yes [provider]  triamcinolone cream (KENALOG) 0.1 % Apply 1 application topically 2 (two) times daily as needed (dry skin Itching).  12/30/17  Yes [provider]  venlafaxine (EFFEXOR) 75 MG tablet Take 75 mg by mouth 3 (three) times daily with meals.    Yes [provider]  XIFAXAN 550 MG TABS tablet TAKE 1 TABLET BY MOUTH TWICE DAILY Patient taking differently: Take 550 mg by mouth 2 (two) times daily. 02/29/20  Yes Mahala Menghini, PA-C  Alcohol Swabs (ALCOHOL PREP) 70 % PADS Apply 1 each topically 3 (three) times daily. 01/31/20   [provider]  hydrocortisone (ANUSOL-HC) 2.5 % rectal cream Place 1 application rectally 2 (two) times daily. 03/27/20   Erenest Rasher, PA-C  iron polysaccharides (NIFEREX) 150 MG capsule Take 150 mg by mouth daily with supper.    [provider]  Lancet Devices (ADJUSTABLE LANCING DEVICE) MISC Apply 1 each topically daily. 05/23/20   [provider]    Allergies as of 11/19/2020 - Review Complete 11/19/2020  Allergen Reaction Noted   Tape Itching and Rash 09/18/2019    Family History  Problem Relation Age of Onset   Colon cancer Father 29        deceased age 29   Breast cancer Sister        deceased   Diabetes Sister    Stroke Mother    Healthy Son    Healthy Daughter    Lung cancer Neg Hx    Ovarian cancer Neg Hx     Social History   Socioeconomic History   Marital status: Married    Spouse name: Not on file   Number of children: 2   Years of education: Not on file   Highest education level: Not on file  Occupational History   Occupation: RETIRED    Employer: SELF EMPLOYED  Tobacco Use   Smoking status: Former    Packs/day: 2.00    Years: 25.00    Pack years: 50.00    Types: Cigarettes    Quit date: 01/28/1986    Years since quitting: 34.9   Smokeless tobacco: Never  Vaping Use   Vaping Use: Never used  Substance and Sexual Activity   Alcohol use: No    Comment: quit in 05/2013   Drug use: No   Sexual activity: Not Currently  Other Topics Concern   Not on file  Social History Narrative   Two step children from second marriage (divorced) who live with him along with some grandchildren.    Social Determinants of Health   Financial Resource Strain: Not on file  Food Insecurity: Not on file  Transportation Needs: Not on file  Physical Activity: Inactive   Days of Exercise per Week: 0 days   Minutes of Exercise per Session: 0 min  Stress: Not on file  Social Connections: Not on file  Intimate Partner Violence: Not At Risk   Fear of Current or Ex-Partner: No   Emotionally Abused: No   Physically Abused: No   Sexually Abused: No    Review of Systems: See HPI, otherwise negative ROS  Physical Exam: BP (!) 162/75   Pulse 78   Temp 98.5 F (36.9 C)   Resp 17   Ht 5\' 7"  (1.702 m)  Wt 95.3 kg   SpO2 93%   BMI 32.89 kg/m  General:   Alert, well oriented conversant.  Disheveled in appearance. Eyes:  Sclera clear, no icterus.   Conjunctiva pink. Ears:  Normal auditory acuity. Neck:  Supple; no masses or thyromegaly. No significant cervical adenopathy. Lungs:  Clear throughout to  auscultation.   No wheezes, crackles, or rhonchi. No acute distress. Heart:  Regular rate and rhythm; no murmurs, clicks, rubs,  or gallops. Abdomen: Distended but not tight.  Abdomen is soft and nontender.  Positive fluid left way.  Pulses:  Normal pulses noted. Extremities: 1-2+ lower extremity edema.   Impression/Plan: 75 year old gentleman with multiple comorbidities including nearing end-stage call at related cirrhosis with anasarca/ascites history of SBP recurrent GI blood loss of obscure etiology.  Known esophageal varices. Has had vague upper abdominal pain.  No evidence of hepatoma on prior imaging. I have offered the patient an EGD and colonoscopy today to further evaluate recurrent anemia and ongoing blood loss EGD and colonoscopy have been offered.  I specifically discussed with him today the potential for esophageal band ligation.  The progressive and noncurable nature of this terminal disease has been discussed with the patient.  I have discussed with anesthesia.  Further recommendations to follow pending findings of today's procedures.       Notice: This dictation was prepared with Dragon dictation along with smaller phrase technology. Any transcriptional errors that result from this process are unintentional and may not be corrected upon review.

## 2021-01-16 NOTE — Anesthesia Preprocedure Evaluation (Addendum)
Anesthesia Evaluation  Patient identified by MRN, date of birth, ID band Patient awake    Reviewed: Allergy & Precautions, NPO status , Patient's Chart, lab work & pertinent test results  History of Anesthesia Complications Negative for: history of anesthetic complications  Airway Mallampati: II  TM Distance: >3 FB Neck ROM: Full    Dental  (+) Edentulous Upper, Edentulous Lower   Pulmonary shortness of breath, with exertion, lying and Long-Term Oxygen Therapy, pneumonia, former smoker,    Pulmonary exam normal breath sounds clear to auscultation       Cardiovascular Exercise Tolerance: Poor hypertension, Pt. on medications + DOE  Normal cardiovascular exam+ dysrhythmias Atrial Fibrillation  Rhythm:Regular Rate:Normal  1. Left ventricular ejection fraction, by estimation, is 55 to 60%. The left ventricle has normal function. The left ventricle has no regional wall motion abnormalities. There is mild left ventricular hypertrophy.  Left ventricular diastolic parameters are indeterminate.  2. Right ventricular systolic function is normal. The right ventricular size is normal.  3. Left atrial size was moderately dilated.  4. Right atrial size was moderately dilated.  5. The mitral valve is normal in structure. Trivial mitral valve regurgitation. No evidence of mitral stenosis.  6. The aortic valve is tricuspid. Aortic valve regurgitation is not visualized. No aortic stenosis is present.  7. The inferior vena cava is normal in size with greater than 50% respiratory variability, suggesting right atrial pressure of 3 mmHg.    Neuro/Psych PSYCHIATRIC DISORDERS Anxiety Depression negative neurological ROS     GI/Hepatic PUD, GERD  Medicated,(+) Cirrhosis   Esophageal Varices and ascites  substance abuse (h/o alcohol abuse)  alcohol use,   Endo/Other  diabetes, Well Controlled, Type 2, Insulin Dependent  Renal/GU ESRF and  DialysisRenal disease  negative genitourinary   Musculoskeletal  (+) Arthritis ,   Abdominal (+) + obese,  Abdomen: soft.    Peds negative pediatric ROS (+)  Hematology negative hematology ROS (+) anemia ,   Anesthesia Other Findings   Reproductive/Obstetrics negative OB ROS                            Anesthesia Physical Anesthesia Plan  ASA: 4  Anesthesia Plan: General   Post-op Pain Management:    Induction: Intravenous  PONV Risk Score and Plan:   Airway Management Planned: Nasal Cannula and Natural Airway  Additional Equipment:   Intra-op Plan:   Post-operative Plan:   Informed Consent: I have reviewed the patients History and Physical, chart, labs and discussed the procedure including the risks, benefits and alternatives for the proposed anesthesia with the patient or authorized representative who has indicated his/her understanding and acceptance.     Dental advisory given  Plan Discussed with: CRNA and Surgeon  Anesthesia Plan Comments:        Anesthesia Quick Evaluation

## 2021-01-16 NOTE — Discharge Instructions (Signed)
EGD Discharge instructions Please read the instructions outlined below and refer to this sheet in the next few weeks. These discharge instructions provide you with general information on caring for yourself after you leave the hospital. Your doctor may also give you specific instructions. While your treatment has been planned according to the most current medical practices available, unavoidable complications occasionally occur. If you have any problems or questions after discharge, please call your doctor. ACTIVITY You may resume your regular activity but move at a slower pace for the next 24 hours.  Take frequent rest periods for the next 24 hours.  Walking will help expel (get rid of) the air and reduce the bloated feeling in your abdomen.  No driving for 24 hours (because of the anesthesia (medicine) used during the test).  You may shower.  Do not sign any important legal documents or operate any machinery for 24 hours (because of the anesthesia used during the test).  NUTRITION Drink plenty of fluids.  You may resume your normal diet.  Begin with a light meal and progress to your normal diet.  Avoid alcoholic beverages for 24 hours or as instructed by your caregiver.  MEDICATIONS You may resume your normal medications unless your caregiver tells you otherwise.  WHAT YOU CAN EXPECT TODAY You may experience abdominal discomfort such as a feeling of fullness or "gas" pains.  FOLLOW-UP Your doctor will discuss the results of your test with you.  SEEK IMMEDIATE MEDICAL ATTENTION IF ANY OF THE FOLLOWING OCCUR: Excessive nausea (feeling sick to your stomach) and/or vomiting.  Severe abdominal pain and distention (swelling).  Trouble swallowing.  Temperature over 101 F (37.8 C).  Rectal bleeding or vomiting of blood.   EGD Discharge instructions Please read the instructions outlined below and refer to this sheet in the next few weeks. These discharge instructions provide you with general  information on caring for yourself after you leave the hospital. Your doctor may also give you specific instructions. While your treatment has been planned according to the most current medical practices available, unavoidable complications occasionally occur. If you have any problems or questions after discharge, please call your doctor. ACTIVITY You may resume your regular activity but move at a slower pace for the next 24 hours.  Take frequent rest periods for the next 24 hours.  Walking will help expel (get rid of) the air and reduce the bloated feeling in your abdomen.  No driving for 24 hours (because of the anesthesia (medicine) used during the test).  You may shower.  Do not sign any important legal documents or operate any machinery for 24 hours (because of the anesthesia used during the test).  NUTRITION Drink plenty of fluids.  You may resume your normal diet.  Begin with a light meal and progress to your normal diet.  Avoid alcoholic beverages for 24 hours or as instructed by your caregiver.  MEDICATIONS You may resume your normal medications unless your caregiver tells you otherwise.  WHAT YOU CAN EXPECT TODAY You may experience abdominal discomfort such as a feeling of fullness or "gas" pains.  FOLLOW-UP Your doctor will discuss the results of your test with you.  SEEK IMMEDIATE MEDICAL ATTENTION IF ANY OF THE FOLLOWING OCCUR: Excessive nausea (feeling sick to your stomach) and/or vomiting.  Severe abdominal pain and distention (swelling).  Trouble swallowing.  Temperature over 101 F (37.8 C).  Rectal bleeding or vomiting of blood.    Bands were placed on your esophageal varices today because it  looks like they had bled  You Have a mass in your colon which was biopsied.  This also has been likely bleeding  Further recommendations to follow pending review of pathology report  If feasible, needs to have a repeat EGD to look at your esophageal varices in 1 month  CBC  8/22  Consider palliative care/hospice care  Office visit with [place] in 1 month  You may see some blood with your next couple of stools we should taper off in the next 24 hours.

## 2021-01-16 NOTE — Telephone Encounter (Signed)
Pt's wife was made aware and verbalized understanding.  °

## 2021-01-16 NOTE — Telephone Encounter (Signed)
Pt's wife called stating that the pt has been constantly spitting up foam since he left the hospital from his procedure today. Wife is wanting to know if this is normal. Wife also states that on his discharge ppw there was a cbc ordered for 01/20/21 and was wanting to know if it was ordered by you. Pt's is not having any other symptoms other than the constantly spitting up foam.

## 2021-01-16 NOTE — Progress Notes (Signed)
Patient has 1 episode of flatulence with rectal bleeding with small clots (50cc). Dr. Gala Romney made aware, VSS patient continues to be asymptomatic. Dr. Gala Romney states patient can be discharged to home. This explained to wife. Further instructed wife that patient will have some bright red rectal bleeding post procedure, but if it is a large amount with large clots and/or patient becomes syncopal or dizzy she needs to call EMS to take patient to ER for evaluation. Wife verbalized understanding.

## 2021-01-16 NOTE — Telephone Encounter (Signed)
Pt had TCS/EGD done today by Dr Gala Romney.  Pt's wife is aware of OV FU. She has questions about his lab orders and also said the patient is foaming and spitting up. Please call [phone]

## 2021-01-16 NOTE — Progress Notes (Signed)
Pt states he had dialysis yesterday.

## 2021-01-16 NOTE — Progress Notes (Signed)
Patient used the bedpan and had 200cc bright red blood with 8-10 walnut size blood clots, Dr. Gala Romney notified by Bubba Hales, RN @ [phone] Dr. Gala Romney request patient to be observed in phase !! Post op for 30 minutes. VSS, patient is asymptomatic. Wife [name] is notified of plan and verbalizes understanding

## 2021-01-16 NOTE — Transfer of Care (Signed)
Immediate Anesthesia Transfer of Care Note  Patient: Jeffrey [name]  Procedure(s) Performed: COLONOSCOPY WITH PROPOFOL ESOPHAGOGASTRODUODENOSCOPY (EGD) WITH PROPOFOL ESOPHAGEAL BANDING BIOPSY  Patient Location: Short Stay  Anesthesia Type:General  Level of Consciousness: drowsy, patient cooperative and responds to stimulation  Airway & Oxygen Therapy: Patient Spontanous Breathing  Post-op Assessment: Report given to RN, Post -op Vital signs reviewed and stable and Patient moving all extremities X 4  Post vital signs: Reviewed and stable  Last Vitals:  Vitals Value Taken Time  BP    Temp    Pulse    Resp    SpO2      Last Pain:  Vitals:   01/16/21 0910  PainSc: 0-No pain      Patients Stated Pain Goal: 5 (79/49/97 1820)  Complications: No notable events documented.

## 2021-01-16 NOTE — Op Note (Signed)
[facility] Patient Name: Jeffrey [name] Procedure Date: 01/16/2021 8:55 AM MRN: [id number] Date of Birth: 06/21/1945 Attending MD: Norvel Richards , MD CSN: [id number] Age: 75 Admit Type: Outpatient Procedure:                Upper GI endoscopy Indications:              Heme positive stool Providers:                Norvel Richards, MD, Janeece Riggers, RN, Dereck Leep, Technician Referring MD:              Medicines:                Propofol per Anesthesia Complications:            No immediate complications. Estimated Blood Loss:     Estimated blood loss was minimal. Procedure:                Pre-Anesthesia Assessment:                           - Prior to the procedure, a History and Physical                            was performed, and patient medications and                            allergies were reviewed. The patient's tolerance of                            previous anesthesia was also reviewed. The risks                            and benefits of the procedure and the sedation                            options and risks were discussed with the patient.                            All questions were answered, and informed consent                            was obtained. Prior Anticoagulants: The patient has                            taken no previous anticoagulant or antiplatelet                            agents. ASA Grade Assessment: IV - A patient with                            severe systemic disease that is a constant threat  to life. After reviewing the risks and benefits,                            the patient was deemed in satisfactory condition to                            undergo the procedure.                           After obtaining informed consent, the endoscope was                            passed under direct vision. Throughout the                            procedure, the patient's  blood pressure, pulse, and                            oxygen saturations were monitored continuously. The                            [id number]-[id number] ([id number]) scope was introduced through the                            mouth, and advanced to the second part of duodenum.                            The upper GI endoscopy was accomplished without                            difficulty. The patient tolerated the procedure                            well. Scope In: 9:47:45 AM Scope Out: 10:01:34 AM Total Procedure Duration: 0 hours 13 minutes 49 seconds  Findings:      4 columns of grade 2 esophageal varices distal one third the tubular       esophagus. 2 columns had overlying 5 mm ulcers. Please see photos.       Tubular esophagus remained patent throughout its course.      Moderate portal hypertensive gastropathy was found in the entire       examined stomach. Friable gastric mucosa diffusely. Some polypoid       appearance of the antrum. No gastric varices ulcer or infiltrating       process observed. Patent pylorus.      The duodenal bulb, second portion of the duodenum and third portion of       the duodenum were well seen. Slight nodularity and congestion involving       the bulb and second portion otherwise normal-appearing mucosa.      Scope was withdrawn and the 7 shot Microvasive bander was attached scope       was reintroduced in the esophagus 1 band placed on each of 4 columns of       varices. 2 of the bands were placed directly on each one of the  ulcerated areas. Good hemostasis maintained. Impression:               - Portal hypertensive gastropathy. 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 Moderate Sedation:      Moderate (conscious) sedation was personally administered by an       anesthesia  professional. The following parameters were monitored: oxygen       saturation, heart rate, blood pressure, respiratory rate, EKG, adequacy       of pulmonary ventilation, and response to care. Recommendation:           - Patient has a contact number available for                            emergencies. The signs and symptoms of potential                            delayed complications were discussed with the                            patient. Return to normal activities tomorrow.                            Written discharge instructions were provided to the                            patient.                           - Clear liquid diet remainder of today. Advance to                            a soft diet tomorrow and then as tolerated                            thereafter. Repeat EGD in 4 weeks. See colonoscopy                            report. Procedure Code(s):        --- Professional ---                           [phone], Esophagogastroduodenoscopy, flexible,                            transoral; diagnostic, including collection of                            specimen(s) by brushing or washing, when  performed                            (separate procedure) Diagnosis Code(s):        --- Professional ---                           K76.6, Portal hypertension                           K31.89, Other diseases of stomach and duodenum                           R19.5, Other fecal abnormalities CPT copyright 2019 [employer]. All rights reserved. The codes documented in this report are preliminary and upon coder review may  be revised to meet current compliance requirements. [name]. Neda Willenbring, MD Norvel Richards, MD 01/16/2021 10:34:19 AM This report has been signed electronically. Number of Addenda: 0

## 2021-01-17 ENCOUNTER — Encounter (HOSPITAL_COMMUNITY): Payer: Self-pay | Admitting: *Deleted

## 2021-01-17 ENCOUNTER — Other Ambulatory Visit: Payer: Self-pay

## 2021-01-17 ENCOUNTER — Inpatient Hospital Stay (HOSPITAL_COMMUNITY)
Admission: EM | Admit: 2021-01-17 | Discharge: 2021-01-20 | DRG: 919 | Disposition: A | Discharge status: Home Health | Payer: Medicare Other | Attending: Internal Medicine | Admitting: Internal Medicine

## 2021-01-17 DIAGNOSIS — K635 Polyp of colon: Secondary | ICD-10-CM | POA: Diagnosis present

## 2021-01-17 DIAGNOSIS — K766 Portal hypertension: Secondary | ICD-10-CM | POA: Diagnosis present

## 2021-01-17 DIAGNOSIS — M1990 Unspecified osteoarthritis, unspecified site: Secondary | ICD-10-CM | POA: Diagnosis present

## 2021-01-17 DIAGNOSIS — Y733 Surgical instruments, materials and gastroenterology and urology devices (including sutures) associated with adverse incidents: Secondary | ICD-10-CM | POA: Diagnosis present

## 2021-01-17 DIAGNOSIS — H9190 Unspecified hearing loss, unspecified ear: Secondary | ICD-10-CM | POA: Diagnosis present

## 2021-01-17 DIAGNOSIS — I4891 Unspecified atrial fibrillation: Secondary | ICD-10-CM | POA: Diagnosis present

## 2021-01-17 DIAGNOSIS — K922 Gastrointestinal hemorrhage, unspecified: Secondary | ICD-10-CM | POA: Diagnosis not present

## 2021-01-17 DIAGNOSIS — R161 Splenomegaly, not elsewhere classified: Secondary | ICD-10-CM | POA: Diagnosis present

## 2021-01-17 DIAGNOSIS — I5032 Chronic diastolic (congestive) heart failure: Secondary | ICD-10-CM | POA: Diagnosis present

## 2021-01-17 DIAGNOSIS — Z6833 Body mass index (BMI) 33.0-33.9, adult: Secondary | ICD-10-CM | POA: Diagnosis not present

## 2021-01-17 DIAGNOSIS — K7030 Alcoholic cirrhosis of liver without ascites: Secondary | ICD-10-CM | POA: Diagnosis not present

## 2021-01-17 DIAGNOSIS — K7031 Alcoholic cirrhosis of liver with ascites: Secondary | ICD-10-CM | POA: Diagnosis present

## 2021-01-17 DIAGNOSIS — R627 Adult failure to thrive: Secondary | ICD-10-CM | POA: Diagnosis present

## 2021-01-17 DIAGNOSIS — J9611 Chronic respiratory failure with hypoxia: Secondary | ICD-10-CM | POA: Diagnosis present

## 2021-01-17 DIAGNOSIS — N186 End stage renal disease: Secondary | ICD-10-CM | POA: Diagnosis present

## 2021-01-17 DIAGNOSIS — Z20822 Contact with and (suspected) exposure to covid-19: Secondary | ICD-10-CM | POA: Diagnosis present

## 2021-01-17 DIAGNOSIS — E871 Hypo-osmolality and hyponatremia: Secondary | ICD-10-CM | POA: Diagnosis present

## 2021-01-17 DIAGNOSIS — Y848 Other medical procedures as the cause of abnormal reaction of the patient, or of later complication, without mention of misadventure at the time of the procedure: Secondary | ICD-10-CM | POA: Diagnosis present

## 2021-01-17 DIAGNOSIS — D696 Thrombocytopenia, unspecified: Secondary | ICD-10-CM | POA: Diagnosis present

## 2021-01-17 DIAGNOSIS — I10 Essential (primary) hypertension: Secondary | ICD-10-CM | POA: Diagnosis present

## 2021-01-17 DIAGNOSIS — Z888 Allergy status to other drugs, medicaments and biological substances status: Secondary | ICD-10-CM

## 2021-01-17 DIAGNOSIS — K91840 Postprocedural hemorrhage and hematoma of a digestive system organ or structure following a digestive system procedure: Secondary | ICD-10-CM | POA: Diagnosis present

## 2021-01-17 DIAGNOSIS — K7290 Hepatic failure, unspecified without coma: Secondary | ICD-10-CM | POA: Diagnosis not present

## 2021-01-17 DIAGNOSIS — K625 Hemorrhage of anus and rectum: Secondary | ICD-10-CM

## 2021-01-17 DIAGNOSIS — Z833 Family history of diabetes mellitus: Secondary | ICD-10-CM

## 2021-01-17 DIAGNOSIS — Z794 Long term (current) use of insulin: Secondary | ICD-10-CM

## 2021-01-17 DIAGNOSIS — Z800 Family history of malignant neoplasm of digestive organs: Secondary | ICD-10-CM

## 2021-01-17 DIAGNOSIS — K5730 Diverticulosis of large intestine without perforation or abscess without bleeding: Secondary | ICD-10-CM | POA: Diagnosis present

## 2021-01-17 DIAGNOSIS — Z823 Family history of stroke: Secondary | ICD-10-CM

## 2021-01-17 DIAGNOSIS — Z79899 Other long term (current) drug therapy: Secondary | ICD-10-CM

## 2021-01-17 DIAGNOSIS — I8510 Secondary esophageal varices without bleeding: Secondary | ICD-10-CM | POA: Diagnosis present

## 2021-01-17 DIAGNOSIS — Z992 Dependence on renal dialysis: Secondary | ICD-10-CM | POA: Diagnosis not present

## 2021-01-17 DIAGNOSIS — I132 Hypertensive heart and chronic kidney disease with heart failure and with stage 5 chronic kidney disease, or end stage renal disease: Secondary | ICD-10-CM | POA: Diagnosis present

## 2021-01-17 DIAGNOSIS — D631 Anemia in chronic kidney disease: Secondary | ICD-10-CM | POA: Diagnosis present

## 2021-01-17 DIAGNOSIS — Z9981 Dependence on supplemental oxygen: Secondary | ICD-10-CM

## 2021-01-17 DIAGNOSIS — E1122 Type 2 diabetes mellitus with diabetic chronic kidney disease: Secondary | ICD-10-CM

## 2021-01-17 DIAGNOSIS — K649 Unspecified hemorrhoids: Secondary | ICD-10-CM | POA: Diagnosis not present

## 2021-01-17 DIAGNOSIS — D123 Benign neoplasm of transverse colon: Secondary | ICD-10-CM | POA: Diagnosis not present

## 2021-01-17 DIAGNOSIS — Z803 Family history of malignant neoplasm of breast: Secondary | ICD-10-CM

## 2021-01-17 DIAGNOSIS — Z87891 Personal history of nicotine dependence: Secondary | ICD-10-CM

## 2021-01-17 DIAGNOSIS — E119 Type 2 diabetes mellitus without complications: Secondary | ICD-10-CM | POA: Diagnosis not present

## 2021-01-17 DIAGNOSIS — K219 Gastro-esophageal reflux disease without esophagitis: Secondary | ICD-10-CM | POA: Diagnosis present

## 2021-01-17 DIAGNOSIS — Z8711 Personal history of peptic ulcer disease: Secondary | ICD-10-CM

## 2021-01-17 DIAGNOSIS — I8511 Secondary esophageal varices with bleeding: Secondary | ICD-10-CM | POA: Diagnosis present

## 2021-01-17 DIAGNOSIS — M898X9 Other specified disorders of bone, unspecified site: Secondary | ICD-10-CM | POA: Diagnosis present

## 2021-01-17 DIAGNOSIS — K8020 Calculus of gallbladder without cholecystitis without obstruction: Secondary | ICD-10-CM | POA: Diagnosis present

## 2021-01-17 DIAGNOSIS — D62 Acute posthemorrhagic anemia: Secondary | ICD-10-CM | POA: Diagnosis present

## 2021-01-17 DIAGNOSIS — K921 Melena: Secondary | ICD-10-CM | POA: Diagnosis not present

## 2021-01-17 DIAGNOSIS — E782 Mixed hyperlipidemia: Secondary | ICD-10-CM | POA: Diagnosis present

## 2021-01-17 DIAGNOSIS — D649 Anemia, unspecified: Secondary | ICD-10-CM

## 2021-01-17 DIAGNOSIS — F419 Anxiety disorder, unspecified: Secondary | ICD-10-CM | POA: Diagnosis present

## 2021-01-17 DIAGNOSIS — K633 Ulcer of intestine: Secondary | ICD-10-CM | POA: Diagnosis present

## 2021-01-17 DIAGNOSIS — I868 Varicose veins of other specified sites: Secondary | ICD-10-CM | POA: Diagnosis present

## 2021-01-17 LAB — COMPREHENSIVE METABOLIC PANEL
ALT: 15 U/L (ref 0–44)
AST: 20 U/L (ref 15–41)
Albumin: 2.3 g/dL — ABNORMAL LOW (ref 3.5–5.0)
Alkaline Phosphatase: 146 U/L — ABNORMAL HIGH (ref 38–126)
Anion gap: 11 (ref 5–15)
BUN: 26 mg/dL — ABNORMAL HIGH (ref 8–23)
CO2: 22 mmol/L (ref 22–32)
Calcium: 8 mg/dL — ABNORMAL LOW (ref 8.9–10.3)
Chloride: 94 mmol/L — ABNORMAL LOW (ref 98–111)
Creatinine, Ser: 3.68 mg/dL — ABNORMAL HIGH (ref 0.61–1.24)
GFR, Estimated: 17 mL/min — ABNORMAL LOW (ref >60)
Glucose, Bld: 221 mg/dL — ABNORMAL HIGH (ref 70–99)
Potassium: 4.4 mmol/L (ref 3.5–5.1)
Sodium: 127 mmol/L — ABNORMAL LOW (ref 135–145)
Total Bilirubin: 1 mg/dL (ref 0.3–1.2)
Total Protein: 5.3 g/dL — ABNORMAL LOW (ref 6.5–8.1)

## 2021-01-17 LAB — CBC WITH DIFFERENTIAL/PLATELET
Abs Immature Granulocytes: 0.02 10*3/uL (ref 0.00–0.07)
Basophils Absolute: 0 10*3/uL (ref 0.0–0.1)
Basophils Relative: 1 %
Eosinophils Absolute: 0.1 10*3/uL (ref 0.0–0.5)
Eosinophils Relative: 2 %
HCT: 18.5 % — ABNORMAL LOW (ref 39.0–52.0)
Hemoglobin: 5.9 g/dL — CL (ref 13.0–17.0)
Immature Granulocytes: 0 %
Lymphocytes Relative: 8 %
Lymphs Abs: 0.4 10*3/uL — ABNORMAL LOW (ref 0.7–4.0)
MCH: 33.3 pg (ref 26.0–34.0)
MCHC: 31.9 g/dL (ref 30.0–36.0)
MCV: 104.5 fL — ABNORMAL HIGH (ref 80.0–100.0)
Monocytes Absolute: 0.3 10*3/uL (ref 0.1–1.0)
Monocytes Relative: 5 %
Neutro Abs: 4.9 10*3/uL (ref 1.7–7.7)
Neutrophils Relative %: 84 %
Platelets: 106 10*3/uL — ABNORMAL LOW (ref 150–400)
RBC: 1.77 MIL/uL — ABNORMAL LOW (ref 4.22–5.81)
RDW: 17.7 % — ABNORMAL HIGH (ref 11.5–15.5)
WBC: 5.8 10*3/uL (ref 4.0–10.5)
nRBC: 0 % (ref 0.0–0.2)

## 2021-01-17 LAB — PREPARE RBC (CROSSMATCH)

## 2021-01-17 LAB — SARS CORONAVIRUS 2 (TAT 6-24 HRS): SARS Coronavirus 2: NEGATIVE

## 2021-01-17 LAB — SURGICAL PATHOLOGY

## 2021-01-17 LAB — I-STAT CHEM 8, ED
BUN: 23 mg/dL (ref 8–23)
Calcium, Ion: 1.09 mmol/L — ABNORMAL LOW (ref 1.15–1.40)
Chloride: 92 mmol/L — ABNORMAL LOW (ref 98–111)
Creatinine, Ser: 3.7 mg/dL — ABNORMAL HIGH (ref 0.61–1.24)
Glucose, Bld: 218 mg/dL — ABNORMAL HIGH (ref 70–99)
HCT: 19 % — ABNORMAL LOW (ref 39.0–52.0)
Hemoglobin: 6.5 g/dL — CL (ref 13.0–17.0)
Potassium: 4.3 mmol/L (ref 3.5–5.1)
Sodium: 129 mmol/L — ABNORMAL LOW (ref 135–145)
TCO2: 24 mmol/L (ref 22–32)

## 2021-01-17 LAB — GLUCOSE, CAPILLARY: Glucose-Capillary: 133 mg/dL — ABNORMAL HIGH (ref 70–99)

## 2021-01-17 LAB — PROTIME-INR
INR: 1.4 — ABNORMAL HIGH (ref 0.8–1.2)
Prothrombin Time: 16.7 seconds — ABNORMAL HIGH (ref 11.4–15.2)

## 2021-01-17 MED ORDER — OCTREOTIDE LOAD VIA INFUSION
100.0000 ug | Freq: Once | INTRAVENOUS | Status: DC
  Filled 2021-01-17: qty 50

## 2021-01-17 MED ORDER — SODIUM CHLORIDE 0.9 % IV SOLN: 100.0000 mL | INTRAVENOUS | Status: DC | PRN

## 2021-01-17 MED ORDER — SODIUM CHLORIDE 0.9 % IV SOLN: 250.0000 mL | INTRAVENOUS | Status: DC | PRN

## 2021-01-17 MED ORDER — SODIUM CHLORIDE 0.9 % IV SOLN
50.0000 ug/h | INTRAVENOUS | Status: DC
  Administered 2021-01-17 – 2021-01-19 (×5): 50 ug/h via INTRAVENOUS
  Filled 2021-01-17 (×5): qty 1

## 2021-01-17 MED ORDER — LIDOCAINE-PRILOCAINE 2.5-2.5 % EX CREA
1.0000 "application " | TOPICAL_CREAM | CUTANEOUS | Status: DC | PRN
  Filled 2021-01-17: qty 5

## 2021-01-17 MED ORDER — OCTREOTIDE LOAD VIA INFUSION
50.0000 ug | Freq: Once | INTRAVENOUS | Status: AC
  Administered 2021-01-17: 50 ug via INTRAVENOUS
  Filled 2021-01-17: qty 25

## 2021-01-17 MED ORDER — CHLORHEXIDINE GLUCONATE CLOTH 2 % EX PADS
6.0000 | MEDICATED_PAD | Freq: Every day | CUTANEOUS | Status: DC
  Administered 2021-01-18 – 2021-01-20 (×3): 6 via TOPICAL

## 2021-01-17 MED ORDER — INSULIN ASPART 100 UNIT/ML IJ SOLN
0.0000 [IU] | Freq: Three times a day (TID) | INTRAMUSCULAR | Status: DC
  Administered 2021-01-18 – 2021-01-19 (×3): 1 [IU] via SUBCUTANEOUS
  Administered 2021-01-19: 2 [IU] via SUBCUTANEOUS
  Administered 2021-01-20 (×2): 1 [IU] via SUBCUTANEOUS

## 2021-01-17 MED ORDER — PENTAFLUOROPROP-TETRAFLUOROETH EX AERO: 1.0000 "application " | INHALATION_SPRAY | CUTANEOUS | Status: DC | PRN

## 2021-01-17 MED ORDER — INSULIN ASPART 100 UNIT/ML IJ SOLN: 0.0000 [IU] | Freq: Every day | INTRAMUSCULAR | Status: DC

## 2021-01-17 MED ORDER — SODIUM CHLORIDE 0.9 % IV SOLN
10.0000 mL/h | Freq: Once | INTRAVENOUS | Status: DC
Start: 2021-01-17 — End: 2021-01-21

## 2021-01-17 MED ORDER — SODIUM CHLORIDE 0.9% FLUSH: 3.0000 mL | INTRAVENOUS | Status: DC | PRN

## 2021-01-17 MED ORDER — PANTOPRAZOLE SODIUM 40 MG IV SOLR
40.0000 mg | Freq: Once | INTRAVENOUS | Status: AC
  Administered 2021-01-17: 40 mg via INTRAVENOUS
  Filled 2021-01-17: qty 40

## 2021-01-17 MED ORDER — SODIUM CHLORIDE 0.9% FLUSH
3.0000 mL | Freq: Two times a day (BID) | INTRAVENOUS | Status: DC
  Administered 2021-01-17 – 2021-01-20 (×2): 3 mL via INTRAVENOUS

## 2021-01-17 MED ORDER — LIDOCAINE HCL (PF) 1 % IJ SOLN: 5.0000 mL | INTRAMUSCULAR | Status: DC | PRN

## 2021-01-17 MED ORDER — SODIUM CHLORIDE 0.9 % IV SOLN
50.0000 ug/h | INTRAVENOUS | Status: DC
  Filled 2021-01-17 (×8): qty 1

## 2021-01-17 NOTE — ED Notes (Signed)
Wife Ruari Mudgett [phone] would like an update asap

## 2021-01-17 NOTE — H&P (Addendum)
History and Physical    Jeffrey [name]:[id number] DOB: 1945/09/06 DOA: 01/17/2021  PCP: Monico Blitz, MD Consultants:  GI-Dr. Gala Romney nephrology: [place] [facility]  Patient coming from:  [facility]rey [name] live at Home - lives with his wife.   Chief Complaint: bright red blood per rectum following colonoscopy.   HPI: Jeffrey [name] is a 75 y.o. 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 Gala Romney. He went home and had 4-5 episodes of bright red blood per rectum and he started to feel weak so he went to [facility] and was found to have a hemoglobin of 5.9. He was given 1 unit of blood and transferred him to [facility]. He feels weak and is a little short of breath.  Denies any cough or leg swelling. No N/V. NO headaches, vision changes, abdominal pain. Makes small amount of urine.    Has q2 week routine amniocentesis for his ascites. Due for amniocentesis next Thursday.   ESRD on dialysis M/W/F. Went on Wednesday. Did not have dialysis today.   ED Course: vitals: afebrile, bp: 132/57, HR: 79, RR: 18, oxygen: 94% on RA Pertinent labs: hgb of 5.9 (10.2 yesterday) , hct: 18.5, platelets of 106 (baseline: 71-96), sodium: 127, glucose: 221, BUN: 26, creatinine: 3.68. he was transfused 1 unit of blood at [facility] and another one was pending to be given here. Started on octreotide infusion per GI. GI and nephrology consulted. We were asked to admit.   Review of Systems: As per HPI; otherwise review of systems reviewed and negative.   Ambulatory Status:  Ambulates with walker    Past Medical History:  Diagnosis Date   A-fib [facility])    when initially stating dialysis january 2021 at [facility] per spouse; never heard anything else about it   Alcoholic cirrhosis [facility])    patient reports completing hep a and b vaccines in 2006   Anemia    has had 3 units of prbcs 2013   Anxiety     Arthritis    Asymptomatic gallstones    Ultrasound in 2006   B12 deficiency 11/21/2020   C. difficile colitis    Cholelithiasis    Chronic kidney disease    Dialysis M/W/F/Sa   Depression    Diabetes ([place])    Duodenal ulcer with hemorrhage    per patient in 2006 or 2007, records have been requested   Dyspnea    low oxygen level -  has O2 2 L all day   Esophageal varices ([place])    see [facility]   GERD (gastroesophageal reflux disease)    Heart burn    History of alcohol abuse    quit 05/2013   HOH (hard of hearing)    Hypertension    Liver cirrhosis ([place])    SBP (spontaneous bacterial peritonitis) ([place])    2020, November 2021.    Splenomegaly    Ultrasound in 2006    Past Surgical History:  Procedure Laterality Date   AGILE CAPSULE N/A 09/20/2019   Procedure: AGILE CAPSULE;  Surgeon: Daneil Dolin, MD;  [facility];  Service: Endoscopy;  Laterality: N/A;   APPENDECTOMY     AV FISTULA PLACEMENT Left 08/17/2019   Procedure: ARTERIOVENOUS (AV) FISTULA CREATION VERSES ARTERIOVENOUS GRAFT;  Surgeon: [name], MD;  Location: [place];  Service: Vascular;  Laterality: Left;   [place]  Left 09/28/2019   Procedure: LEFT SECOND STAGE BASCILIC VEIN TRANSPOSITION;  Surgeon: [name], MD;  Location: [place];  Service: Vascular;  Laterality: Left;   BIOPSY  02/03/2018   Procedure: BIOPSY;  Surgeon: Daneil Dolin, MD;  [facility];  Service: Endoscopy;;  bx of gastric polyps   BIOPSY  09/19/2019   Procedure: BIOPSY;  Surgeon: Danie Binder, MD;  [facility];  Service: Endoscopy;;   CATARACT EXTRACTION Right    COLONOSCOPY  03/10/2005   Rectal polyp as described above, removed with snare. Left sided  diverticula. The remainder of the colonic mucosa appeared normal. Inflammed polyp on path.   COLONOSCOPY  04/1999   Dr. Thea Silversmith polyps removed,  Path showed hyperplastic   COLONOSCOPY  10/2015   Dr. Britta Mccreedy: diverticulosis, single  sessile tubular adenoma 3-13mm in size removed from descending colon.    COLONOSCOPY WITH ESOPHAGOGASTRODUODENOSCOPY (EGD)  03/31/2012   ZOX:WRUEAVW AVMs. Colonic diverticulosis. tubular adenoma colon   COLONOSCOPY WITH ESOPHAGOGASTRODUODENOSCOPY (EGD)  06/2019   FORSYTH: small esophageal varices without high risk stigmata, single large AVM on lesser curvature in gastric body s/p APC ablation, gastric antral and duodenal bulb polyposis. No significant source to explain transfusion dependent anemia. Colonoscopy with portal colopathy and diffuse edema, changes of Cdiff colitis on colonoscopy   ESOPHAGEAL BANDING N/A 02/03/2018   Procedure: ESOPHAGEAL BANDING;  Surgeon: Daneil Dolin, MD;  [facility];  Service: Endoscopy;  Laterality: N/A;   ESOPHAGOGASTRODUODENOSCOPY  01/15/2005   Three columns grade 1 to 2 esophageal varices, otherwise normal esophageal mucosa.  Esophagus was not manipulated otherwise./Nodularity of the antrum with overlying erosions, nonspecific finding. Path showed rare H.pylori   ESOPHAGOGASTRODUODENOSCOPY  09/2008   Dr. Gaylene Brooks columns of grade 2-3 esoph varices, only one column was prominent. Portal gastropathy, multiple gastrc polyps at antrum, two were 2cm with black eschar, bulbar polyps, bulbar erosions   ESOPHAGOGASTRODUODENOSCOPY  11/2004   Dr. Brantley Stage 3 esoph varices   ESOPHAGOGASTRODUODENOSCOPY  03/31/2012   RMR: 4 columns(3-GR 2, 1-GR1) non-bleeding esophageal varices, portal gastropathy, small HH, early GAVE, multiple gastric polyps    ESOPHAGOGASTRODUODENOSCOPY (EGD) WITH PROPOFOL N/A 02/03/2018   Dr. Gala Romney: Esophageal varices, 3 columns grade 1-2.  Portal hypertensive gastropathy.  Multiple gastric polyps, biopsy consistent with hyperplastic.   ESOPHAGOGASTRODUODENOSCOPY (EGD) WITH PROPOFOL N/A 09/19/2019   Fields: grade I and II esophageal varcies, mild portal hypertensive gastropathy, moderate gastritis but no H. pylori, single hyperplastic  gastric polyp removed, obvious source for melena/transfusion dependent anemia not identified, may be due to friable gastric and duodenal mucosa in the setting of portal hypertension   GASTRIC VARICES BANDING  03/31/2012   Procedure: GASTRIC VARICES BANDING;  Surgeon: Daneil Dolin, MD;  [facility];  Service: Endoscopy;  Laterality: N/A;   GIVENS CAPSULE STUDY N/A 09/21/2019   Poor study with a lot of debris obstructing much of the view of the first approximate 3 hours out of 6 hours.  No obvious source of bleeding identified.  Sequela of bleeding and old blood seen in the form of"whisps" of blood, blood flecks mostly toward the end of the study.   IR REMOVAL TUN CV CATH W/O FL  02/15/2020   POLYPECTOMY  09/19/2019   Procedure: POLYPECTOMY;  Surgeon: Danie Binder, MD;  [facility];  Service: Endoscopy;;   UMBILICAL HERNIA REPAIR  2017   exploratory laparotomy, abdominal washout    Social History   Socioeconomic History  Marital status: Married    Spouse name: Not on file   Number of children: 2   Years of education: Not on file   Highest education level: Not on file  Occupational History   Occupation: RETIRED    Employer: SELF EMPLOYED  Tobacco Use   Smoking status: Former    Packs/day: 2.00    Years: 25.00    Pack years: 50.00    Types: Cigarettes    Quit date: 01/28/1986    Years since quitting: 34.9   Smokeless tobacco: Never  Vaping Use   Vaping Use: Never used  Substance and Sexual Activity   Alcohol use: No    Comment: quit in 05/2013   Drug use: No   Sexual activity: Not Currently  Other Topics Concern   Not on file  Social History Narrative   Two step children from second marriage (divorced) who live with him along with some grandchildren.    Social Determinants of Health   Financial Resource Strain: Not on file  Food Insecurity: Not on file  Transportation Needs: Not on file  Physical Activity: Inactive   Days of Exercise per Week: 0  days   Minutes of Exercise per Session: 0 min  Stress: Not on file  Social Connections: Not on file  Intimate Partner Violence: Not At Risk   Fear of Current or Ex-Partner: No   Emotionally Abused: No   Physically Abused: No   Sexually Abused: No    Allergies  Allergen Reactions   Tape Itching, Rash and Other (See Comments)    Tears skin    Family History  Problem Relation Age of Onset   Colon cancer Father 28       deceased age 39   Breast cancer Sister        deceased   Diabetes Sister    Stroke Mother    Healthy Son    Healthy Daughter    Lung cancer Neg Hx    Ovarian cancer Neg Hx     Prior to Admission medications   Medication Sig Start Date End Date Taking? Authorizing Provider  acetaminophen (TYLENOL) 650 MG CR tablet Take 650 mg by mouth every 8 (eight) hours as needed for pain.    [provider]  Alcohol Swabs (ALCOHOL PREP) 70 % PADS Apply 1 each topically 3 (three) times daily. 01/31/20   [provider]  ALPRAZolam Duanne Moron) 0.5 MG tablet Take 0.5 mg by mouth daily as needed for anxiety. 09/21/20   [provider]  Ascorbic Acid (VITAMIN C) 1000 MG tablet Take 1,000 mg by mouth in the morning.    [provider]  calcium carbonate (TUMS - DOSED IN MG ELEMENTAL CALCIUM) 500 MG chewable tablet Chew 1 tablet by mouth 3 (three) times daily with meals.    [provider]  Calcium Carbonate-Simethicone (ALKA-SELTZER HEARTBURN + GAS) 750-80 MG CHEW Chew 1-2 tablets by mouth 2 (two) times daily as needed (acid reflux).    [provider]  diltiazem (TIAZAC) 120 MG 24 hr capsule Take 120 mg by mouth daily. 05/28/20   [provider]  furosemide (LASIX) 40 MG tablet Take 80 mg by mouth daily. 12/29/19   [provider]  gabapentin (NEURONTIN) 100 MG capsule TAKE 1 CAPSULE BY MOUTH AT BEDTIME Patient taking differently: Take 100 mg by mouth daily with supper. 05/28/20   Waynetta Sandy, MD   GLUCAGEN HYPOKIT 1 MG SOLR injection Inject 1 mg into the muscle once as  needed for low blood sugar. 11/28/20   [provider]  HUMALOG KWIKPEN 100 UNIT/ML KwikPen Inject 10-50 Units into the skin 3 (three) times daily. 10/24/20   [provider]  hydrocortisone (ANUSOL-HC) 2.5 % rectal cream Place 1 application rectally 2 (two) times daily. 03/27/20   Erenest Rasher, PA-C  hydrOXYzine (ATARAX/VISTARIL) 25 MG tablet Take 25 mg by mouth 3 (three) times daily. 11/23/20   [provider]  insulin degludec (TRESIBA FLEXTOUCH) 200 UNIT/ML FlexTouch Pen Inject 120 Units into the skin at bedtime. When blood sugars are up 02/20/20   Barton Dubois, MD  iron polysaccharides (NIFEREX) 150 MG capsule Take 150 mg by mouth daily with supper.    [provider]  lactulose (CONSTULOSE) 10 GM/15ML solution TAKE 45 MLS EVERY MORNING AND TAKE 45 MLS EVERY EVENING 07/16/20   Mahala Menghini, PA-C  Lancet Devices (ADJUSTABLE LANCING DEVICE) MISC Apply 1 each topically daily. 05/23/20   [provider]  levofloxacin (LEVAQUIN) 500 MG tablet Take 500 mg 3 times a week after dialysis. 04/09/20   Rourk, [name], MD  metoprolol succinate (TOPROL-XL) 25 MG 24 hr tablet Take 1 tablet (25 mg total) by mouth daily. Take at bedtime  on Monday ,Wednesday and Friday due to dialaylisis Patient taking differently: Take 25 mg by mouth See admin instructions. Take 25 mg by mouth at bedtime every night and 25 mg in morning on  Sun, Tues, Thurs and Sat 01/06/20   Barton Dubois, MD  midodrine (PROAMATINE) 10 MG tablet Take 1 tablet (10 mg total) by mouth 2 (two) times daily with a meal. 04/06/20   Tat, Shanon Brow, MD  milk thistle 175 MG tablet Take 175 mg by mouth 3 (three) times daily.     [provider]  mirtazapine (REMERON) 30 MG tablet Take 1 tablet (30 mg total) by mouth at bedtime. Patient taking differently: Take 30 mg by mouth daily with supper. 02/10/20   Roxan Hockey, MD   Multiple Vitamin (MULTIVITAMIN WITH MINERALS) TABS tablet Take 1 tablet by mouth in the morning.     [provider]  NOVOFINE 32G X 6 MM MISC Inject into the skin. 01/31/20   [provider]  nystatin (MYCOSTATIN/NYSTOP) powder Apply topically 3 (three) times daily. Into groin area and affected area Patient taking differently: Apply 1 application topically as needed. Into groin area and affected area 01/06/20   Barton Dubois, MD  omega-3 acid ethyl esters (LOVAZA) 1 g capsule Take 2 g by mouth 2 (two) times daily.  04/07/19   [provider]  omeprazole (PRILOSEC) 40 MG capsule Take 40 mg by mouth daily.    [provider]  ondansetron (ZOFRAN) 4 MG tablet Take 4 mg by mouth as needed for nausea or vomiting. Patient not taking: Reported on 01/13/2021    [provider]  oxyCODONE-acetaminophen (PERCOCET/ROXICET) 5-325 MG tablet Take 1 tablet by mouth every 4 (four) hours as needed for moderate pain. 02/27/20   [provider]  Ped Vitamins ACD Fl-Iron (TRI-VIT/FLUORIDE/IRON PO) Take 1 tablet by mouth daily. 08/01/19   [provider]  polyethylene glycol-electrolytes (TRILYTE) 420 g solution Take 4,000 mLs by mouth as directed. 11/19/20   Rourk, [name], MD  pravastatin (PRAVACHOL) 20 MG tablet Take 20 mg by mouth in the morning.     [provider]  Probiotic Product (PROBIOTIC DAILY PO) Take 1 tablet by mouth in the morning.     [provider]  spironolactone (ALDACTONE)  50 MG tablet Take 50 mg by mouth in the morning.     [provider]  thiamine (VITAMIN B-1) 100 MG tablet Take 100 mg by mouth in the morning. 08/27/20   [provider]  tiZANidine (ZANAFLEX) 2 MG tablet Take 2 mg by mouth 2 (two) times daily as needed for muscle spasms.  02/29/20   [provider]  triamcinolone cream (KENALOG) 0.1 % Apply 1 application topically 2 (two) times daily as needed (dry skin Itching).  12/30/17    [provider]  venlafaxine (EFFEXOR) 75 MG tablet Take 75 mg by mouth 3 (three) times daily with meals.     [provider]  XIFAXAN 550 MG TABS tablet TAKE 1 TABLET BY MOUTH TWICE DAILY Patient taking differently: Take 550 mg by mouth 2 (two) times daily. 02/29/20   Mahala Menghini, PA-C    Physical Exam: Vitals:   01/17/21 1741 01/17/21 1945 01/17/21 1956 01/17/21 2016  BP: (!) 130/53 (!) 171/58  (!) 145/70  Pulse: 79 81  86  Resp: 15 20  19   Temp:   98.4 F (36.9 C) 98.1 F (36.7 C)  TempSrc:   Oral Oral  SpO2: 97% 100%  100%  Weight:      Height:         General:  Appears calm and comfortable and is in NAD. Pale appearing and jaundiced.  Eyes:  PERRL, EOMI, normal lids, iris. Scleral icterus.  ENT:  hard of hearing lips & tongue, mmm; appropriate dentition-dentures  Neck:  no LAD, masses or thyromegaly; no carotid bruits Cardiovascular:  RRR, +systolic murmur. 1+ bilateral pitting edema to mid lower leg.  Respiratory:   CTA bilaterally with no wheezes/rales/rhonchi.  Normal respiratory effort. Abdomen:  soft, distended. No fluid wave. No rebound or gaurding.  Back:   normal alignment, no CVAT Skin:  no rash or induration seen on limited exam. Large vertical incisional scar on midline of abdomen. AV fistula in left arm.  Musculoskeletal:  grossly normal tone BUE/BLE, good ROM, no bony abnormality Lower extremity:   Limited foot exam with no ulcerations.  2+ distal pulses. Psychiatric:  grossly normal mood and affect, speech fluent and appropriate, AOx3 Neurologic:  CN 2-12 grossly intact, moves all extremities in coordinated fashion, sensation intact    Radiological Exams on Admission: Independently reviewed - see discussion in A/P where applicable  No results found.  EKG: Independently reviewed.  NSR with rate 74; nonspecific ST changes with no evidence of acute ischemia. Borderline prolonged QT   Labs on Admission: I have personally reviewed the  available labs and imaging studies at the time of the admission.  Pertinent labs:   hgb of 5.9 (10.2 yesterday)  hct: 18.5 platelets of 106 (baseline: 71-96) sodium: 127 glucose: 221 BUN: 26/creatinine: 3.68    Assessment/Plan Principal Problem:   GI bleed -75 year old male with bright red blood per per rectum following colonoscopy and EGD yesterday now with hemoglobin down to 5.9 -GI consulted and he was started on octreotide drip -N.p.o. follow-up on GI recommendations -Transfused 1 unit at [facility] and getting second unit here at [facility]. repeat CBC following transfusion and then serial H&H's every 4 hours x4 -placed in Progressive unit secondary to high risk of decompensation  Active Problems:   Acute blood loss anemia -Upper versus lower GI bleed status post EGD and colonoscopy yesterday -Getting transfused 2 units of packed red blood cells with serial H&H as per above -Follow-up on GI recommendations ?  Cscope tomorrow?  -NPO    Alcoholic cirrhosis of liver with ascites ([place]) -Stable at this time.undergoes therapeutic amniocentesis every 2 weeks.  Due for this next Thursday -Continue Lasix and spironolactone -Continue Xifaxan -MELD score of 27    Esophageal varices in alcoholic cirrhosis (HCC) -Had EGD yesterday done by Dr. Buford Dresser -Findings included portal hypertensive gastropathy.  Grade 2 esophageal varices status post esophageal band ligation.  -Started on octreotide drip -Follow-up on GI recommendations -keeping NPO except for medication  -not on beta blocker due to hx of SBP    Essential hypertension, benign -Has been fairly well controlled.  -Continue his diltiazem -Also on midodrine and will continue this starting tomorrow    Type 2 diabetes mellitus (HCC) -A1c 9 months ago was well controlled at 5.8 -Repeat A1c pending -His home insulin regime is written as a as needed basis with his [place]o at 120 units as needed for high blood sugars and his sliding  scale at 10 to 50 units as needed before meals.  Holding this in setting of n.p.o. but may need to start back a decreased dose of his long-acting insulin.  -Putting him on 4 hour Accu-Cheks while n.p.o. -Placing him on sensitive sliding scale insulin as well as bedtime coverage    ESRD (end stage renal disease) [facility]) -nephrology consulted with plans for dialysis tomorrow -Electrolytes stable Monitor intake/output     Hyponatremia -Corrected sodium is 130.  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    Level of care: Progressive DVT prophylaxis:  SCDs secondary to acute blood loss anemia/procedure tomorrow  Code Status:  Full - confirmed with patient Family Communication: none present: spoke with his wife by phone: doris Furgerson: [phone] Disposition Plan:  The patient is from: home  Anticipated d/c is to: home  Requires inpatient hospitalization and is at significant risk of worsening, requires constant monitoring, assessment, IV medication and intervention and MDM with specialists.  Patient is currently: acutely ill Consults called: GI by EDP and nephrology  Admission status:  inpatient    Orma Flaming MD [facility]   How to contact the [facility] Attending or Consulting provider [place] or covering provider during after hours [place], for this patient?  Check the care team in [facility] and look for a)  attending/consulting [facility] provider listed and b) the [facility] team listed Log into [email or web address] and use [place]'s universal password to access. If you do not have the password, please contact the hospital operator. Locate the [facility] provider you are looking for under [facility] and page to a number that you can be directly reached. If you still have difficulty reaching the provider, please page the [facility] (Director on Call) for the Hospitalists listed on amion for assistance.   01/17/2021, 8:18 PM

## 2021-01-17 NOTE — ED Triage Notes (Signed)
States he had a colonoscopy yesterday and today is passing some blood, patient is on dialysis

## 2021-01-17 NOTE — ED Provider Notes (Addendum)
[facility] EMERGENCY DEPARTMENT Provider Note   CSN: [id number] Arrival date & time: 01/17/21  1132     History Chief Complaint  Patient presents with   Rectal Bleeding    Jeffrey [name] is a 75 y.o. male.  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    The history is provided by the patient.  Rectal Bleeding Quality:  Bright red Amount:  Moderate Timing:  Intermittent Associated symptoms: no abdominal pain, no fever and no vomiting   Associated symptoms comment:  Weakness/fatigue      Past Medical History:  Diagnosis Date   A-fib [facility])    when initially stating dialysis january 2021 at [facility] per spouse; never heard anything else about it   Alcoholic cirrhosis [facility])    patient reports completing hep a and b vaccines in 2006   Anemia    has had 3 units of prbcs 2013   Anxiety    Arthritis    Asymptomatic gallstones    Ultrasound in 2006   B12 deficiency 11/21/2020   C. difficile colitis    Cholelithiasis    Chronic kidney disease    Dialysis M/W/F/Sa   Depression    Diabetes ([place])    Duodenal ulcer with hemorrhage    per patient in 2006 or 2007, records have been requested   Dyspnea    low oxygen level -  has O2 2 L all day   Esophageal varices ([place])    see PSH   GERD (gastroesophageal reflux disease)    Heart burn    History of alcohol abuse    quit 05/2013   HOH (hard of hearing)    Hypertension    Liver cirrhosis ([place])    SBP (spontaneous bacterial peritonitis) ([place])    2020, November 2021.    Splenomegaly    Ultrasound in 2006    Patient Active Problem List   Diagnosis Date Noted   B12 deficiency 11/21/2020   Iron deficiency anemia due to chronic blood loss 06/25/2020   Gastroesophageal reflux  disease 06/06/2020   Bacteremia due to Gram-positive bacteria 04/03/2020   Acute on chronic respiratory failure with hypoxia and hypercapnia (HCC) 04/03/2020   Lobar pneumonia ([place]) 04/03/2020   Volume overload 04/02/2020   Anxiety with depression 04/02/2020   Asymptomatic bacteriuria 04/02/2020   Pain of upper abdomen 02/22/2020   Urine leukocytes increased 02/22/2020   Fall    Minor head injury    Chronic diastolic HF (heart failure) ([place])    Physical deconditioning    Acute hyponatremia 02/19/2020   Fever and chills 02/09/2020   Advanced care planning/counseling discussion    DNR (do not resuscitate)    Alcoholic cirrhosis of liver ([place]) 02/08/2020   Back pain 02/08/2020   Status post thoracentesis    Hepatic encephalopathy ([place]) 01/01/2020   Bilateral pleural effusion 12/31/2019   Hypoglycemia 12/31/2019   Hyperammonemia ([place]) 12/31/2019   Class 2 obesity 12/31/2019   Hypoalbuminemia 12/31/2019   Nosebleed    Goals of care, counseling/discussion    Palliative care by specialist    Acute hepatic encephalopathy 12/13/2019   Encephalopathy acute    Hyperkalemia    Chronic hyponatremia    Anemia 11/16/2019   Acute blood loss anemia 09/20/2019   Gastrointestinal  hemorrhage    Acute on chronic anemia 09/18/2019   Generalized weakness 09/18/2019   Type 2 diabetes mellitus ([place]) 09/18/2019   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[place]) 09/18/2019   Enteritis due to Clostridium difficile 08/31/2019   Edema 16/11/3708   Alcoholic cirrhosis ([place]) 62/69/4854   Type 2 diabetes mellitus with stage 3 chronic kidney disease, with long-term current use of insulin ([place]) 10/20/2017   Essential hypertension, benign 10/20/2017   Morbid obesity ([place]) 10/20/2017   Mixed hyperlipidemia 10/20/2017   Ascites    SBP (spontaneous bacterial peritonitis) ([place]) 01/12/2016   Shigella dysenteriae 01/11/2016   Diarrhea 01/10/2016   Chronic liver disease and cirrhosis ([place]) 62/70/3500   Alcoholic  cirrhosis of liver with ascites ([place]) 03/09/2012   Heme positive stool 03/09/2012   Anemia due to chronic blood loss 03/09/2012   Pancytopenia ([place]) 03/09/2012   FH: colon cancer 03/09/2012   Esophageal varices in alcoholic cirrhosis ([place]) 93/81/8299    Past Surgical History:  Procedure Laterality Date   AGILE CAPSULE N/A 09/20/2019   Procedure: AGILE CAPSULE;  Surgeon: Jeffrey Dolin, MD;  [facility];  Service: Endoscopy;  Laterality: N/A;   APPENDECTOMY     AV FISTULA PLACEMENT Left 08/17/2019   Procedure: ARTERIOVENOUS (AV) FISTULA CREATION VERSES ARTERIOVENOUS GRAFT;  Surgeon: Jeffrey Posner, MD;  Location: [place];  Service: Vascular;  Laterality: Left;   BASCILIC VEIN TRANSPOSITION Left 09/28/2019   Procedure: LEFT SECOND STAGE [place];  Surgeon: Jeffrey Posner, MD;  Location: [place];  Service: Vascular;  Laterality: Left;   BIOPSY  02/03/2018   Procedure: BIOPSY;  Surgeon: Jeffrey Dolin, MD;  [facility];  Service: Endoscopy;;  bx of gastric polyps   BIOPSY  09/19/2019   Procedure: BIOPSY;  Surgeon: Jeffrey Binder, MD;  [facility];  Service: Endoscopy;;   CATARACT EXTRACTION Right    COLONOSCOPY  03/10/2005   Rectal polyp as described above, removed with snare. Left sided  diverticula. The remainder of the colonic mucosa appeared normal. Inflammed polyp on path.   COLONOSCOPY  04/1999   Dr. Thea [name] polyps removed,  Path showed hyperplastic   COLONOSCOPY  10/2015   Dr. Britta [name]: diverticulosis, single sessile tubular adenoma 3-83mm in size removed from descending colon.    COLONOSCOPY WITH ESOPHAGOGASTRODUODENOSCOPY (EGD)  03/31/2012   BZJ:IRCVELF AVMs. Colonic diverticulosis. tubular adenoma colon   COLONOSCOPY WITH ESOPHAGOGASTRODUODENOSCOPY (EGD)  06/2019   FORSYTH: small esophageal varices without high risk stigmata, single large AVM on lesser curvature in gastric body s/p APC ablation, gastric antral and duodenal bulb  polyposis. No significant source to explain transfusion dependent anemia. Colonoscopy with portal colopathy and diffuse edema, changes of Cdiff colitis on colonoscopy   ESOPHAGEAL BANDING N/A 02/03/2018   Procedure: ESOPHAGEAL BANDING;  Surgeon: Jeffrey Dolin, MD;  [facility];  Service: Endoscopy;  Laterality: N/A;   ESOPHAGOGASTRODUODENOSCOPY  01/15/2005   Three columns grade 1 to 2 esophageal varices, otherwise normal esophageal mucosa.  Esophagus was not manipulated otherwise./Nodularity of the antrum with overlying erosions, nonspecific finding. Path showed rare H.pylori   ESOPHAGOGASTRODUODENOSCOPY  09/2008   Dr. Gaylene Brooks columns of grade 2-3 esoph varices, only one column was prominent. Portal gastropathy, multiple gastrc polyps at antrum, two were 2cm with black eschar, bulbar polyps, bulbar erosions   ESOPHAGOGASTRODUODENOSCOPY  11/2004   Dr. Brantley Stage 3 esoph varices   ESOPHAGOGASTRODUODENOSCOPY  03/31/2012   RMR: 4 columns(3-GR 2, 1-GR1) non-bleeding esophageal varices, portal  gastropathy, small HH, early GAVE, multiple gastric polyps    ESOPHAGOGASTRODUODENOSCOPY (EGD) WITH PROPOFOL N/A 02/03/2018   Dr. Gala Romney: Esophageal varices, 3 columns grade 1-2.  Portal hypertensive gastropathy.  Multiple gastric polyps, biopsy consistent with hyperplastic.   ESOPHAGOGASTRODUODENOSCOPY (EGD) WITH PROPOFOL N/A 09/19/2019   Fields: grade I and II esophageal varcies, mild portal hypertensive gastropathy, moderate gastritis but no H. pylori, single hyperplastic gastric polyp removed, obvious source for melena/transfusion dependent anemia not identified, may be due to friable gastric and duodenal mucosa in the setting of portal hypertension   GASTRIC VARICES BANDING  03/31/2012   Procedure: GASTRIC VARICES BANDING;  Surgeon: Jeffrey Dolin, MD;  [facility];  Service: Endoscopy;  Laterality: N/A;   GIVENS CAPSULE STUDY N/A 09/21/2019   Poor study with a lot of debris  obstructing much of the view of the first approximate 3 hours out of 6 hours.  No obvious source of bleeding identified.  Sequela of bleeding and old blood seen in the form of"whisps" of blood, blood flecks mostly toward the end of the study.   IR REMOVAL TUN CV CATH W/O FL  02/15/2020   POLYPECTOMY  09/19/2019   Procedure: POLYPECTOMY;  Surgeon: Jeffrey Binder, MD;  [facility];  Service: Endoscopy;;   UMBILICAL HERNIA REPAIR  2017   exploratory laparotomy, abdominal washout       Family History  Problem Relation Age of Onset   Colon cancer Father 15       deceased age 27   Breast cancer Sister        deceased   Diabetes Sister    Stroke Mother    Healthy Son    Healthy Daughter    Lung cancer Neg Hx    Ovarian cancer Neg Hx     Social History   Tobacco Use   Smoking status: Former    Packs/day: 2.00    Years: 25.00    Pack years: 50.00    Types: Cigarettes    Quit date: 01/28/1986    Years since quitting: 34.9   Smokeless tobacco: Never  Vaping Use   Vaping Use: Never used  Substance Use Topics   Alcohol use: No    Comment: quit in 05/2013   Drug use: No    Home Medications Prior to Admission medications   Medication Sig Start Date End Date Taking? Authorizing Provider  acetaminophen (TYLENOL) 650 MG CR tablet Take 650 mg by mouth every 8 (eight) hours as needed for pain.    [provider]  Alcohol Swabs (ALCOHOL PREP) 70 % PADS Apply 1 each topically 3 (three) times daily. 01/31/20   [provider]  ALPRAZolam Duanne Moron) 0.5 MG tablet Take 0.5 mg by mouth daily as needed for anxiety. 09/21/20   [provider]  Ascorbic Acid (VITAMIN C) 1000 MG tablet Take 1,000 mg by mouth in the morning.    [provider]  calcium carbonate (TUMS - DOSED IN MG ELEMENTAL CALCIUM) 500 MG chewable tablet Chew 1 tablet by mouth 3 (three) times daily with meals.    [provider]  Calcium Carbonate-Simethicone (ALKA-SELTZER  HEARTBURN + GAS) 750-80 MG CHEW Chew 1-2 tablets by mouth 2 (two) times daily as needed (acid reflux).    [provider]  diltiazem (TIAZAC) 120 MG 24 hr capsule Take 120 mg by mouth daily. 05/28/20   [provider]  furosemide (LASIX) 40 MG tablet Take 80 mg by mouth daily. 12/29/19  [provider]  gabapentin (NEURONTIN) 100 MG capsule TAKE 1 CAPSULE BY MOUTH AT BEDTIME Patient taking differently: Take 100 mg by mouth daily with supper. 05/28/20   Waynetta Sandy, MD  GLUCAGEN HYPOKIT 1 MG SOLR injection Inject 1 mg into the muscle once as needed for low blood sugar. 11/28/20   [provider]  HUMALOG KWIKPEN 100 UNIT/ML KwikPen Inject 10-50 Units into the skin 3 (three) times daily. 10/24/20   [provider]  hydrocortisone (ANUSOL-HC) 2.5 % rectal cream Place 1 application rectally 2 (two) times daily. 03/27/20   Erenest Rasher, PA-C  hydrOXYzine (ATARAX/VISTARIL) 25 MG tablet Take 25 mg by mouth 3 (three) times daily. 11/23/20   [provider]  insulin degludec (TRESIBA FLEXTOUCH) 200 UNIT/ML FlexTouch Pen Inject 120 Units into the skin at bedtime. When blood sugars are up 02/20/20   Barton Dubois, MD  iron polysaccharides (NIFEREX) 150 MG capsule Take 150 mg by mouth daily with supper.    [provider]  lactulose (CONSTULOSE) 10 GM/15ML solution TAKE 45 MLS EVERY MORNING AND TAKE 45 MLS EVERY EVENING 07/16/20   Mahala Menghini, PA-C  Lancet Devices (ADJUSTABLE LANCING DEVICE) MISC Apply 1 each topically daily. 05/23/20   [provider]  levofloxacin (LEVAQUIN) 500 MG tablet Take 500 mg 3 times a week after dialysis. 04/09/20   Rourk, [name], MD  metoprolol succinate (TOPROL-XL) 25 MG 24 hr tablet Take 1 tablet (25 mg total) by mouth daily. Take at bedtime  on Monday ,Wednesday and Friday due to dialaylisis Patient taking differently: Take 25 mg by mouth See admin instructions. Take 25 mg by mouth at  bedtime every night and 25 mg in morning on  Sun, Tues, Thurs and Sat 01/06/20   Barton Dubois, MD  midodrine (PROAMATINE) 10 MG tablet Take 1 tablet (10 mg total) by mouth 2 (two) times daily with a meal. 04/06/20   Tat, Shanon Brow, MD  milk thistle 175 MG tablet Take 175 mg by mouth 3 (three) times daily.     [provider]  mirtazapine (REMERON) 30 MG tablet Take 1 tablet (30 mg total) by mouth at bedtime. Patient taking differently: Take 30 mg by mouth daily with supper. 02/10/20   Roxan Hockey, MD  Multiple Vitamin (MULTIVITAMIN WITH MINERALS) TABS tablet Take 1 tablet by mouth in the morning.     [provider]  NOVOFINE 32G X 6 MM MISC Inject into the skin. 01/31/20   [provider]  nystatin (MYCOSTATIN/NYSTOP) powder Apply topically 3 (three) times daily. Into groin area and affected area Patient taking differently: Apply 1 application topically as needed. Into groin area and affected area 01/06/20   Barton Dubois, MD  omega-3 acid ethyl esters (LOVAZA) 1 g capsule Take 2 g by mouth 2 (two) times daily.  04/07/19   [provider]  omeprazole (PRILOSEC) 40 MG capsule Take 40 mg by mouth daily.    [provider]  ondansetron (ZOFRAN) 4 MG tablet Take 4 mg by mouth as needed for nausea or vomiting. Patient not taking: Reported on 01/13/2021    [provider]  oxyCODONE-acetaminophen (PERCOCET/ROXICET) 5-325 MG tablet Take 1 tablet by mouth every 4 (four) hours as needed for moderate pain. 02/27/20   [provider]  Ped Vitamins ACD Fl-Iron (TRI-VIT/FLUORIDE/IRON PO) Take 1 tablet by mouth daily. 08/01/19   [provider]  polyethylene glycol-electrolytes (TRILYTE) 420 g solution Take 4,000 mLs by mouth as directed. 11/19/20  Rourk, [name], MD  pravastatin (PRAVACHOL) 20 MG tablet Take 20 mg by mouth in the morning.     [provider]  Probiotic Product (PROBIOTIC DAILY PO) Take 1 tablet by mouth in the morning.      [provider]  spironolactone (ALDACTONE) 50 MG tablet Take 50 mg by mouth in the morning.     [provider]  thiamine (VITAMIN B-1) 100 MG tablet Take 100 mg by mouth in the morning. 08/27/20   [provider]  tiZANidine (ZANAFLEX) 2 MG tablet Take 2 mg by mouth 2 (two) times daily as needed for muscle spasms.  02/29/20   [provider]  triamcinolone cream (KENALOG) 0.1 % Apply 1 application topically 2 (two) times daily as needed (dry skin Itching).  12/30/17   [provider]  venlafaxine (EFFEXOR) 75 MG tablet Take 75 mg by mouth 3 (three) times daily with meals.     [provider]  XIFAXAN 550 MG TABS tablet TAKE 1 TABLET BY MOUTH TWICE DAILY Patient taking differently: Take 550 mg by mouth 2 (two) times daily. 02/29/20   Mahala Menghini, PA-C    Allergies    Tape  Review of Systems   Review of Systems  Constitutional:  Positive for fatigue. Negative for chills and fever.  HENT:  Negative for ear pain and sore throat.   Eyes:  Negative for pain and visual disturbance.  Respiratory:  Negative for cough and shortness of breath.   Cardiovascular:  Negative for chest pain and palpitations.  Gastrointestinal:  Positive for abdominal distention, hematochezia and nausea. Negative for abdominal pain and vomiting.       Bleeding from rectum   Genitourinary:  Negative for dysuria and hematuria.  Musculoskeletal:  Negative for arthralgias and back pain.  Skin:  Negative for color change and rash.  Neurological:  Negative for seizures and syncope.  All other systems reviewed and are negative.  Physical Exam Updated Vital Signs BP (!) 141/47 (BP Location: Right Arm)   Pulse 74   Temp 98 F (36.7 C) (Oral)   Resp 18   Ht 5\' 7"  (1.702 m)   Wt 95 kg   SpO2 98%   BMI 32.80 kg/m   Physical Exam Vitals and nursing note reviewed.  Constitutional:      Appearance: He is well-developed. He is ill-appearing.  HENT:     Head:  Normocephalic and atraumatic.  Eyes:     General: Scleral icterus present.     Conjunctiva/sclera: Conjunctivae normal.  Cardiovascular:     Rate and Rhythm: Normal rate and regular rhythm.     Heart sounds: No murmur heard. Pulmonary:     Effort: Pulmonary effort is normal. No respiratory distress.     Breath sounds: Normal breath sounds.  Abdominal:     General: Abdomen is protuberant. There is distension.     Palpations: Abdomen is soft.     Tenderness: There is no abdominal tenderness.  Musculoskeletal:     Cervical back: Neck supple.  Skin:    General: Skin is warm and dry.     Coloration: Skin is jaundiced.    ED Results / Procedures / Treatments   Labs (all labs ordered are listed, but only abnormal results are displayed) Labs Reviewed  CBC WITH DIFFERENTIAL/PLATELET - Abnormal; Notable for the following components:      Result Value   RBC 1.77 (*)    Hemoglobin 5.9 (*)    HCT 18.5 (*)  MCV 104.5 (*)    RDW 17.7 (*)    Platelets 106 (*)    Lymphs Abs 0.4 (*)    All other components within normal limits  COMPREHENSIVE METABOLIC PANEL - Abnormal; Notable for the following components:   Sodium 127 (*)    Chloride 94 (*)    Glucose, Bld 221 (*)    BUN 26 (*)    Creatinine, Ser 3.68 (*)    Calcium 8.0 (*)    Total Protein 5.3 (*)    Albumin 2.3 (*)    Alkaline Phosphatase 146 (*)    GFR, Estimated 17 (*)    All other components within normal limits  PROTIME-INR - Abnormal; Notable for the following components:   Prothrombin Time 16.7 (*)    INR 1.4 (*)    All other components within normal limits  I-STAT CHEM 8, ED - Abnormal; Notable for the following components:   Sodium 129 (*)    Chloride 92 (*)    Creatinine, Ser 3.70 (*)    Glucose, Bld 218 (*)    Calcium, Ion 1.09 (*)    Hemoglobin 6.5 (*)    HCT 19.0 (*)    All other components within normal limits  TYPE AND SCREEN  PREPARE RBC (CROSSMATCH)    EKG None  Radiology No results  found.  Procedures Procedures   Medications Ordered in ED Medications  0.9 %  sodium chloride infusion (has no administration in time range)  octreotide (SANDOSTATIN) 2 mcg/mL load via infusion 100 mcg (has no administration in time range)    And  octreotide (SANDOSTATIN) 500 mcg in sodium chloride 0.9 % 250 mL (2 mcg/mL) infusion (has no administration in time range)  Chlorhexidine Gluconate Cloth 2 % PADS 6 each (has no administration in time range)  pentafluoroprop-tetrafluoroeth (GEBAUERS) aerosol 1 application (has no administration in time range)  lidocaine (PF) (XYLOCAINE) 1 % injection 5 mL (has no administration in time range)  lidocaine-prilocaine (EMLA) cream 1 application (has no administration in time range)  0.9 %  sodium chloride infusion (has no administration in time range)  0.9 %  sodium chloride infusion (has no administration in time range)  pantoprazole (PROTONIX) injection 40 mg (40 mg Intravenous Given 01/17/21 1331)    ED Course  I have reviewed the triage vital signs and the nursing notes.  Pertinent labs & imaging results that were available during my care of the patient were reviewed by me and considered in my medical decision making (see chart for details).    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 On my exam patient is lethargic but arousal. Blood products running. Stable vitals.   -Pt was ordered 2 U prbc from [facility]. 1 U transfused on arrival. Issue with releasing second unit here at [facility] since orders were placed at [facility]. Nursing staff up to date. 1 unit pRBC ordered here at [facility] to make a total of 2 units transfused. Octreotide also ordered at [facility] but not yet started,  issue with leasing medications. Octreotide bolus and ggt re-ordered.  [facility] already on consult.  Nephrology already on  consult. Recommending admission to floor at this time, will hold off on ICU due to stable vitals, stable mental status, and no active bleeding on exam. I spoke with admitting physician from [facility] who agrees to accept patient.       Final Clinical Impression(s) / ED Diagnoses Final diagnoses:  Rectal bleeding  Anemia, unspecified type    Rx / DC Orders ED Discharge Orders     None        Lianne Cure, DO 78/93/81 0175    Lianne Cure, DO 03/25/84 2778    Joani Cosma, [place], DO 24/23/53 1731

## 2021-01-17 NOTE — ED Provider Notes (Signed)
[facility] EMERGENCY DEPARTMENT Provider Note   CSN: [id number] Arrival date & time: 01/17/21  1132     History Chief Complaint  Patient presents with   Rectal Bleeding    Jeffrey [name] is a 75 y.o. male.  Patient has a history of cirrhosis he is also a dialysis patient he had endoscopy upper and lower done yesterday.  He had biopsy done on the lower endoscopy and banding done on the upper endoscopy.  Patient had varices in his colon.  This procedure was done by Dr. Gala Romney.  Patient has been bleeding ever since bright red blood  The history is provided by the patient and medical records. No language interpreter was used.  Rectal Bleeding Quality:  Bright red Amount:  Moderate Duration:  24 hours Timing:  Constant Chronicity:  New Context: not anal fissures   Similar prior episodes: no   Relieved by:  Nothing Worsened by:  Nothing Ineffective treatments:  None tried Associated symptoms: no abdominal pain       Past Medical History:  Diagnosis Date   A-fib [facility])    when initially stating dialysis january 2021 at [facility] per spouse; never heard anything else about it   Alcoholic cirrhosis [facility])    patient reports completing hep a and b vaccines in 2006   Anemia    has had 3 units of prbcs 2013   Anxiety    Arthritis    Asymptomatic gallstones    Ultrasound in 2006   B12 deficiency 11/21/2020   C. difficile colitis    Cholelithiasis    Chronic kidney disease    Dialysis M/W/F/Sa   Depression    Diabetes ([place])    Duodenal ulcer with hemorrhage    per patient in 2006 or 2007, records have been requested   Dyspnea    low oxygen level -  has O2 2 L all day   Esophageal varices ([place])    see PSH   GERD (gastroesophageal reflux disease)    Heart burn    History of alcohol abuse    quit 05/2013   HOH (hard of hearing)    Hypertension    Liver cirrhosis ([place])    SBP (spontaneous bacterial peritonitis) ([place])    2020, November 2021.    Splenomegaly     Ultrasound in 2006    Patient Active Problem List   Diagnosis Date Noted   B12 deficiency 11/21/2020   Iron deficiency anemia due to chronic blood loss 06/25/2020   Gastroesophageal reflux disease 06/06/2020   Bacteremia due to Gram-positive bacteria 04/03/2020   Acute on chronic respiratory failure with hypoxia and hypercapnia (HCC) 04/03/2020   Lobar pneumonia ([place]) 04/03/2020   Volume overload 04/02/2020   Anxiety with depression 04/02/2020   Asymptomatic bacteriuria 04/02/2020   Pain of upper abdomen 02/22/2020   Urine leukocytes increased 02/22/2020   Fall    Minor head injury    Chronic diastolic HF (heart failure) ([place])    Physical deconditioning    Acute hyponatremia 02/19/2020   Fever and chills 02/09/2020   Advanced care planning/counseling discussion    DNR (do not resuscitate)    Alcoholic cirrhosis of liver ([place]) 02/08/2020   Back pain 02/08/2020   Status post thoracentesis    Hepatic encephalopathy ([place]) 01/01/2020   Bilateral pleural effusion 12/31/2019   Hypoglycemia 12/31/2019   Hyperammonemia ([place]) 12/31/2019   Class 2 obesity 12/31/2019   Hypoalbuminemia 12/31/2019   Nosebleed    Goals of care, counseling/discussion  Palliative care by specialist    Acute hepatic encephalopathy 12/13/2019   Encephalopathy acute    Hyperkalemia    Chronic hyponatremia    Anemia 11/16/2019   Acute blood loss anemia 09/20/2019   Gastrointestinal hemorrhage    Acute on chronic anemia 09/18/2019   Generalized weakness 09/18/2019   Type 2 diabetes mellitus ([place]) 09/18/2019   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[place]) 09/18/2019   Enteritis due to Clostridium difficile 08/31/2019   Edema 32/67/1245   Alcoholic cirrhosis ([place]) 80/99/8338   Type 2 diabetes mellitus with stage 3 chronic kidney disease, with long-term current use of insulin ([place]) 10/20/2017   Essential hypertension, benign 10/20/2017   Morbid obesity ([place]) 10/20/2017   Mixed hyperlipidemia 10/20/2017    Ascites    SBP (spontaneous bacterial peritonitis) ([place]) 01/12/2016   Shigella dysenteriae 01/11/2016   Diarrhea 01/10/2016   Chronic liver disease and cirrhosis ([place]) 25/09/3974   Alcoholic cirrhosis of liver with ascites ([place]) 03/09/2012   Heme positive stool 03/09/2012   Anemia due to chronic blood loss 03/09/2012   Pancytopenia ([place]) 03/09/2012   FH: colon cancer 03/09/2012   Esophageal varices in alcoholic cirrhosis ([place]) 73/41/9379    Past Surgical History:  Procedure Laterality Date   AGILE CAPSULE N/A 09/20/2019   Procedure: AGILE CAPSULE;  Surgeon: Daneil Dolin, MD;  Location[facility];  Service: Endoscopy;  Laterality: N/A;   APPENDECTOMY     AV FISTULA PLACEMENT Left 08/17/2019   Procedure: ARTERIOVENOUS (AV) FISTULA CREATION VERSES ARTERIOVENOUS GRAFT;  Surgeon: [name], MD;  Location: [place];  Service: Vascular;  Laterality: Left;   BASCILIC VEIN TRANSPOSITION Left 09/28/2019   Procedure: LEFT SECOND STAGE [place];  Surgeon: [name], MD;  Location: [place];  Service: Vascular;  Laterality: Left;   BIOPSY  02/03/2018   Procedure: BIOPSY;  Surgeon: Daneil Dolin, MD;  Location[facility];  Service: Endoscopy;;  bx of gastric polyps   BIOPSY  09/19/2019   Procedure: BIOPSY;  Surgeon: Danie Binder, MD;  Location[facility];  Service: Endoscopy;;   CATARACT EXTRACTION Right    COLONOSCOPY  03/10/2005   Rectal polyp as described above, removed with snare. Left sided  diverticula. The remainder of the colonic mucosa appeared normal. Inflammed polyp on path.   COLONOSCOPY  04/1999   Dr. Thea Silversmith polyps removed,  Path showed hyperplastic   COLONOSCOPY  10/2015   Dr. Britta Mccreedy: diverticulosis, single sessile tubular adenoma 3-68mm in size removed from descending colon.    COLONOSCOPY WITH ESOPHAGOGASTRODUODENOSCOPY (EGD)  03/31/2012   KWI:OXBDZHG AVMs. Colonic diverticulosis. tubular adenoma colon   COLONOSCOPY WITH  ESOPHAGOGASTRODUODENOSCOPY (EGD)  06/2019   FORSYTH: small esophageal varices without high risk stigmata, single large AVM on lesser curvature in gastric body s/p APC ablation, gastric antral and duodenal bulb polyposis. No significant source to explain transfusion dependent anemia. Colonoscopy with portal colopathy and diffuse edema, changes of Cdiff colitis on colonoscopy   ESOPHAGEAL BANDING N/A 02/03/2018   Procedure: ESOPHAGEAL BANDING;  Surgeon: Daneil Dolin, MD;  [facility];  Service: Endoscopy;  Laterality: N/A;   ESOPHAGOGASTRODUODENOSCOPY  01/15/2005   Three columns grade 1 to 2 esophageal varices, otherwise normal esophageal mucosa.  Esophagus was not manipulated otherwise./Nodularity of the antrum with overlying erosions, nonspecific finding. Path showed rare H.pylori   ESOPHAGOGASTRODUODENOSCOPY  09/2008   Dr. Gaylene Brooks columns of grade 2-3 esoph varices, only one column was prominent. Portal gastropathy, multiple gastrc polyps at  antrum, two were 2cm with black eschar, bulbar polyps, bulbar erosions   ESOPHAGOGASTRODUODENOSCOPY  11/2004   Dr. Brantley Stage 3 esoph varices   ESOPHAGOGASTRODUODENOSCOPY  03/31/2012   RMR: 4 columns(3-GR 2, 1-GR1) non-bleeding esophageal varices, portal gastropathy, small HH, early GAVE, multiple gastric polyps    ESOPHAGOGASTRODUODENOSCOPY (EGD) WITH PROPOFOL N/A 02/03/2018   Dr. Gala Romney: Esophageal varices, 3 columns grade 1-2.  Portal hypertensive gastropathy.  Multiple gastric polyps, biopsy consistent with hyperplastic.   ESOPHAGOGASTRODUODENOSCOPY (EGD) WITH PROPOFOL N/A 09/19/2019   Fields: grade I and II esophageal varcies, mild portal hypertensive gastropathy, moderate gastritis but no H. pylori, single hyperplastic gastric polyp removed, obvious source for melena/transfusion dependent anemia not identified, may be due to friable gastric and duodenal mucosa in the setting of portal hypertension   GASTRIC VARICES BANDING  03/31/2012    Procedure: GASTRIC VARICES BANDING;  Surgeon: Daneil Dolin, MD;  [facility];  Service: Endoscopy;  Laterality: N/A;   GIVENS CAPSULE STUDY N/A 09/21/2019   Poor study with a lot of debris obstructing much of the view of the first approximate 3 hours out of 6 hours.  No obvious source of bleeding identified.  Sequela of bleeding and old blood seen in the form of"whisps" of blood, blood flecks mostly toward the end of the study.   IR REMOVAL TUN CV CATH W/O FL  02/15/2020   POLYPECTOMY  09/19/2019   Procedure: POLYPECTOMY;  Surgeon: Danie Binder, MD;  [facility];  Service: Endoscopy;;   UMBILICAL HERNIA REPAIR  2017   exploratory laparotomy, abdominal washout       Family History  Problem Relation Age of Onset   Colon cancer Father 57       deceased age 25   Breast cancer Sister        deceased   Diabetes Sister    Stroke Mother    Healthy Son    Healthy Daughter    Lung cancer Neg Hx    Ovarian cancer Neg Hx     Social History   Tobacco Use   Smoking status: Former    Packs/day: 2.00    Years: 25.00    Pack years: 50.00    Types: Cigarettes    Quit date: 01/28/1986    Years since quitting: 34.9   Smokeless tobacco: Never  Vaping Use   Vaping Use: Never used  Substance Use Topics   Alcohol use: No    Comment: quit in 05/2013   Drug use: No    Home Medications Prior to Admission medications   Medication Sig Start Date End Date Taking? Authorizing Provider  acetaminophen (TYLENOL) 650 MG CR tablet Take 650 mg by mouth every 8 (eight) hours as needed for pain.    [provider]  Alcohol Swabs (ALCOHOL PREP) 70 % PADS Apply 1 each topically 3 (three) times daily. 01/31/20   [provider]  ALPRAZolam Duanne Moron) 0.5 MG tablet Take 0.5 mg by mouth daily as needed for anxiety. 09/21/20   [provider]  Ascorbic Acid (VITAMIN C) 1000 MG tablet Take 1,000 mg by mouth in the morning.    [provider]  calcium  carbonate (TUMS - DOSED IN MG ELEMENTAL CALCIUM) 500 MG chewable tablet Chew 1 tablet by mouth 3 (three) times daily with meals.    [provider]  Calcium Carbonate-Simethicone (ALKA-SELTZER HEARTBURN + GAS) 750-80 MG CHEW Chew 1-2 tablets by mouth 2 (two) times daily as needed (acid reflux).  [provider]  diltiazem (TIAZAC) 120 MG 24 hr capsule Take 120 mg by mouth daily. 05/28/20   [provider]  furosemide (LASIX) 40 MG tablet Take 80 mg by mouth daily. 12/29/19   [provider]  gabapentin (NEURONTIN) 100 MG capsule TAKE 1 CAPSULE BY MOUTH AT BEDTIME Patient taking differently: Take 100 mg by mouth daily with supper. 05/28/20   Waynetta Sandy, MD  GLUCAGEN HYPOKIT 1 MG SOLR injection Inject 1 mg into the muscle once as needed for low blood sugar. 11/28/20   [provider]  HUMALOG KWIKPEN 100 UNIT/ML KwikPen Inject 10-50 Units into the skin 3 (three) times daily. 10/24/20   [provider]  hydrocortisone (ANUSOL-HC) 2.5 % rectal cream Place 1 application rectally 2 (two) times daily. 03/27/20   Erenest Rasher, PA-C  hydrOXYzine (ATARAX/VISTARIL) 25 MG tablet Take 25 mg by mouth 3 (three) times daily. 11/23/20   [provider]  insulin degludec (TRESIBA FLEXTOUCH) 200 UNIT/ML FlexTouch Pen Inject 120 Units into the skin at bedtime. When blood sugars are up 02/20/20   Barton Dubois, MD  iron polysaccharides (NIFEREX) 150 MG capsule Take 150 mg by mouth daily with supper.    [provider]  lactulose (CONSTULOSE) 10 GM/15ML solution TAKE 45 MLS EVERY MORNING AND TAKE 45 MLS EVERY EVENING 07/16/20   Mahala Menghini, PA-C  Lancet Devices (ADJUSTABLE LANCING DEVICE) MISC Apply 1 each topically daily. 05/23/20   [provider]  levofloxacin (LEVAQUIN) 500 MG tablet Take 500 mg 3 times a week after dialysis. 04/09/20   Rourk, [name], MD  metoprolol succinate (TOPROL-XL) 25 MG 24 hr tablet Take 1  tablet (25 mg total) by mouth daily. Take at bedtime  on Monday ,Wednesday and Friday due to dialaylisis Patient taking differently: Take 25 mg by mouth See admin instructions. Take 25 mg by mouth at bedtime every night and 25 mg in morning on  Sun, Tues, Thurs and Sat 01/06/20   Barton Dubois, MD  midodrine (PROAMATINE) 10 MG tablet Take 1 tablet (10 mg total) by mouth 2 (two) times daily with a meal. 04/06/20   Tat, Shanon Brow, MD  milk thistle 175 MG tablet Take 175 mg by mouth 3 (three) times daily.     [provider]  mirtazapine (REMERON) 30 MG tablet Take 1 tablet (30 mg total) by mouth at bedtime. Patient taking differently: Take 30 mg by mouth daily with supper. 02/10/20   Roxan Hockey, MD  Multiple Vitamin (MULTIVITAMIN WITH MINERALS) TABS tablet Take 1 tablet by mouth in the morning.     [provider]  NOVOFINE 32G X 6 MM MISC Inject into the skin. 01/31/20   [provider]  nystatin (MYCOSTATIN/NYSTOP) powder Apply topically 3 (three) times daily. Into groin area and affected area Patient taking differently: Apply 1 application topically as needed. Into groin area and affected area 01/06/20   Barton Dubois, MD  omega-3 acid ethyl esters (LOVAZA) 1 g capsule Take 2 g by mouth 2 (two) times daily.  04/07/19   [provider]  omeprazole (PRILOSEC) 40 MG capsule Take 40 mg by mouth daily.    [provider]  ondansetron (ZOFRAN) 4 MG tablet Take 4 mg by mouth as needed for nausea or vomiting. Patient not taking: Reported on 01/13/2021    [provider]  oxyCODONE-acetaminophen (PERCOCET/ROXICET) 5-325 MG tablet Take 1 tablet by mouth every 4 (four) hours as needed for moderate pain. 02/27/20   [provider]  Ped Vitamins ACD Fl-Iron (TRI-VIT/FLUORIDE/IRON PO) Take 1 tablet by mouth daily. 08/01/19   [provider]  polyethylene glycol-electrolytes (TRILYTE) 420 g solution Take 4,000 mLs by mouth as directed. 11/19/20   Rourk,  [name], MD  pravastatin (PRAVACHOL) 20 MG tablet Take 20 mg by mouth in the morning.     [provider]  Probiotic Product (PROBIOTIC DAILY PO) Take 1 tablet by mouth in the morning.     [provider]  spironolactone (ALDACTONE) 50 MG tablet Take 50 mg by mouth in the morning.     [provider]  thiamine (VITAMIN B-1) 100 MG tablet Take 100 mg by mouth in the morning. 08/27/20   [provider]  tiZANidine (ZANAFLEX) 2 MG tablet Take 2 mg by mouth 2 (two) times daily as needed for muscle spasms.  02/29/20   [provider]  triamcinolone cream (KENALOG) 0.1 % Apply 1 application topically 2 (two) times daily as needed (dry skin Itching).  12/30/17   [provider]  venlafaxine (EFFEXOR) 75 MG tablet Take 75 mg by mouth 3 (three) times daily with meals.     [provider]  XIFAXAN 550 MG TABS tablet TAKE 1 TABLET BY MOUTH TWICE DAILY Patient taking differently: Take 550 mg by mouth 2 (two) times daily. 02/29/20   Mahala Menghini, PA-C    Allergies    Tape  Review of Systems   Review of Systems  Constitutional:  Negative for appetite change and fatigue.  HENT:  Negative for congestion, ear discharge and sinus pressure.   Eyes:  Negative for discharge.  Respiratory:  Negative for cough.   Cardiovascular:  Negative for chest pain.  Gastrointestinal:  Positive for hematochezia. Negative for abdominal pain and diarrhea.       Bright red blood per rectum  Genitourinary:  Negative for frequency and hematuria.  Musculoskeletal:  Negative for back pain.  Skin:  Negative for rash.  Neurological:  Negative for seizures and headaches.  Psychiatric/Behavioral:  Negative for hallucinations.    Physical Exam Updated Vital Signs BP (!) 136/58   Pulse 77   Temp 97.8 F (36.6 C) (Oral)   Resp 18   SpO2 100%   Physical Exam Vitals and nursing note reviewed.  Constitutional:      Appearance: He is well-developed.  HENT:      Head: Normocephalic.     Nose: Nose normal.  Eyes:     General: No scleral icterus.    Conjunctiva/sclera: Conjunctivae normal.  Neck:     Thyroid: No thyromegaly.  Cardiovascular:     Rate and Rhythm: Normal rate and regular rhythm.     Heart sounds: No murmur heard.   No friction rub. No gallop.  Pulmonary:     Breath sounds: No stridor. No wheezing or rales.  Chest:     Chest wall: No tenderness.  Abdominal:     General: There is distension.     Tenderness: There is no abdominal tenderness. There is no rebound.  Genitourinary:    Comments: Bright red blood per rectum Musculoskeletal:        General: Normal range of motion.     Cervical back: Neck supple.  Lymphadenopathy:     Cervical: No cervical adenopathy.  Skin:    Findings: No erythema or rash.  Neurological:     Mental Status: He is alert and oriented to person, place, and time.     Motor: No abnormal muscle  tone.     Coordination: Coordination normal.  Psychiatric:        Behavior: Behavior normal.    ED Results / Procedures / Treatments   Labs (all labs ordered are listed, but only abnormal results are displayed) Labs Reviewed  CBC WITH DIFFERENTIAL/PLATELET - Abnormal; Notable for the following components:      Result Value   RBC 1.77 (*)    Hemoglobin 5.9 (*)    HCT 18.5 (*)    MCV 104.5 (*)    RDW 17.7 (*)    Platelets 106 (*)    Lymphs Abs 0.4 (*)    All other components within normal limits  COMPREHENSIVE METABOLIC PANEL - Abnormal; Notable for the following components:   Sodium 127 (*)    Chloride 94 (*)    Glucose, Bld 221 (*)    BUN 26 (*)    Creatinine, Ser 3.68 (*)    Calcium 8.0 (*)    Total Protein 5.3 (*)    Albumin 2.3 (*)    Alkaline Phosphatase 146 (*)    GFR, Estimated 17 (*)    All other components within normal limits  PROTIME-INR - Abnormal; Notable for the following components:   Prothrombin Time 16.7 (*)    INR 1.4 (*)    All other components within normal limits   I-STAT CHEM 8, ED - Abnormal; Notable for the following components:   Sodium 129 (*)    Chloride 92 (*)    Creatinine, Ser 3.70 (*)    Glucose, Bld 218 (*)    Calcium, Ion 1.09 (*)    Hemoglobin 6.5 (*)    HCT 19.0 (*)    All other components within normal limits  TYPE AND SCREEN  PREPARE RBC (CROSSMATCH)    EKG None  Radiology No results found.  Procedures Procedures   Medications Ordered in ED Medications  0.9 %  sodium chloride infusion (has no administration in time range)  octreotide (SANDOSTATIN) 2 mcg/mL load via infusion 100 mcg (has no administration in time range)    And  octreotide (SANDOSTATIN) 500 mcg in sodium chloride 0.9 % 250 mL (2 mcg/mL) infusion (has no administration in time range)  pantoprazole (PROTONIX) injection 40 mg (has no administration in time range)    ED Course  I have reviewed the triage vital signs and the nursing notes.  Pertinent labs & imaging results that were available during my care of the patient were reviewed by me and considered in my medical decision making (see chart for details). CRITICAL CARE Performed by: Milton Ferguson Total critical care time: 40 minutes Critical care time was exclusive of separately billable procedures and treating other patients. Critical care was necessary to treat or prevent imminent or life-threatening deterioration. Critical care was time spent personally by me on the following activities: development of treatment plan with patient and/or surrogate as well as nursing, discussions with consultants, evaluation of patient's response to treatment, examination of patient, obtaining history from patient or surrogate, ordering and performing treatments and interventions, ordering and review of laboratory studies, ordering and review of radiographic studies, pulse oximetry and re-evaluation of patient's condition.  Patient with cirrhosis and rectal bleeding.  Colonoscopy yesterday showed varices in his colon  and upper endoscopy showed varices in his esophagus.  He had a biopsy done through the colonoscopy and has been having rectal bleeding ever since.  His hemoglobin yesterday was 10 and today it is 6.  Patient is hemodynamically stable.  I spoke with  Dr. Jenetta Downer gastroenterologist.  And his phone number is [phone].  He stated that the patient needs colonoscopy done for the rectal bleeding but anesthesia at [facility] stated the patient could not be sedated here and needed to go to [facility].  I spoke with the [place] and notified her that we are transporting the patient to [facility] emergency department with Dr. Vallery Ridge as the accepting doctor the patient..  Nephrology has been contacted about the patient's signout the patient is coming to [facility]. MDM Rules/Calculators/A&P                           Rectal bleeding with cirrhosis and renal failure. Final Clinical Impression(s) / ED Diagnoses Final diagnoses:  Rectal bleeding    Rx / DC Orders ED Discharge Orders     None        Milton Ferguson, MD 01/17/21 1330

## 2021-01-17 NOTE — ED Notes (Addendum)
Pt arrives as ED to ED transfer for GI bleed. Blood infusing on arrival. Pt awake, oriented, GCS 15. Skin pale but warm. Denies chest pain or shortness of breath. No distress noted. Call light in reach.

## 2021-01-17 NOTE — ED Notes (Signed)
RN attempted to call report to 3E x1 

## 2021-01-17 NOTE — ED Notes (Signed)
EMS arrived to transport pt to [facility] ED. Kathlee Nations, SWAT RN to accompany during transport.

## 2021-01-17 NOTE — ED Notes (Signed)
2 IV's attempted without success

## 2021-01-17 NOTE — ED Notes (Signed)
Pt's 18 R AC not giving blood back, able to flush but not sure about integrity of line. EDP attempted to start [place] line, no success. IV team RN currently at bedside attempting. Pt put on bed pan, bright red blood from rectum noted at this time.

## 2021-01-17 NOTE — Discharge Instructions (Addendum)
Transfer patient to the emergency department over at [facility].  Dr. Michael Boston excepting.[facility] has been notified about the rectal bleeding patient

## 2021-01-18 DIAGNOSIS — D696 Thrombocytopenia, unspecified: Secondary | ICD-10-CM

## 2021-01-18 DIAGNOSIS — K7031 Alcoholic cirrhosis of liver with ascites: Secondary | ICD-10-CM

## 2021-01-18 DIAGNOSIS — N186 End stage renal disease: Secondary | ICD-10-CM

## 2021-01-18 DIAGNOSIS — K922 Gastrointestinal hemorrhage, unspecified: Secondary | ICD-10-CM | POA: Diagnosis not present

## 2021-01-18 DIAGNOSIS — I8510 Secondary esophageal varices without bleeding: Secondary | ICD-10-CM

## 2021-01-18 DIAGNOSIS — E871 Hypo-osmolality and hyponatremia: Secondary | ICD-10-CM

## 2021-01-18 DIAGNOSIS — E119 Type 2 diabetes mellitus without complications: Secondary | ICD-10-CM

## 2021-01-18 DIAGNOSIS — K649 Unspecified hemorrhoids: Secondary | ICD-10-CM

## 2021-01-18 DIAGNOSIS — K7030 Alcoholic cirrhosis of liver without ascites: Secondary | ICD-10-CM

## 2021-01-18 DIAGNOSIS — D62 Acute posthemorrhagic anemia: Secondary | ICD-10-CM | POA: Diagnosis not present

## 2021-01-18 DIAGNOSIS — I5032 Chronic diastolic (congestive) heart failure: Secondary | ICD-10-CM

## 2021-01-18 DIAGNOSIS — Z794 Long term (current) use of insulin: Secondary | ICD-10-CM

## 2021-01-18 LAB — CBC
HCT: 20.3 % — ABNORMAL LOW (ref 39.0–52.0)
Hemoglobin: 6.8 g/dL — CL (ref 13.0–17.0)
MCH: 32.4 pg (ref 26.0–34.0)
MCHC: 33.5 g/dL (ref 30.0–36.0)
MCV: 96.7 fL (ref 80.0–100.0)
Platelets: 79 10*3/uL — ABNORMAL LOW (ref 150–400)
RBC: 2.1 MIL/uL — ABNORMAL LOW (ref 4.22–5.81)
RDW: 17.3 % — ABNORMAL HIGH (ref 11.5–15.5)
WBC: 4.6 10*3/uL (ref 4.0–10.5)
nRBC: 0 % (ref 0.0–0.2)

## 2021-01-18 LAB — HEPATITIS B SURFACE ANTIGEN: Hepatitis B Surface Ag: NONREACTIVE

## 2021-01-18 LAB — GLUCOSE, CAPILLARY
Glucose-Capillary: 153 mg/dL — ABNORMAL HIGH (ref 70–99)
Glucose-Capillary: 156 mg/dL — ABNORMAL HIGH (ref 70–99)
Glucose-Capillary: 158 mg/dL — ABNORMAL HIGH (ref 70–99)
Glucose-Capillary: 166 mg/dL — ABNORMAL HIGH (ref 70–99)
Glucose-Capillary: 175 mg/dL — ABNORMAL HIGH (ref 70–99)
Glucose-Capillary: 246 mg/dL — ABNORMAL HIGH (ref 70–99)

## 2021-01-18 LAB — BASIC METABOLIC PANEL
Anion gap: 9 (ref 5–15)
BUN: 27 mg/dL — ABNORMAL HIGH (ref 8–23)
CO2: 22 mmol/L (ref 22–32)
Calcium: 7.7 mg/dL — ABNORMAL LOW (ref 8.9–10.3)
Chloride: 94 mmol/L — ABNORMAL LOW (ref 98–111)
Creatinine, Ser: 4.18 mg/dL — ABNORMAL HIGH (ref 0.61–1.24)
GFR, Estimated: 14 mL/min — ABNORMAL LOW (ref >60)
Glucose, Bld: 167 mg/dL — ABNORMAL HIGH (ref 70–99)
Potassium: 4.3 mmol/L (ref 3.5–5.1)
Sodium: 125 mmol/L — ABNORMAL LOW (ref 135–145)

## 2021-01-18 LAB — PREPARE RBC (CROSSMATCH)

## 2021-01-18 LAB — HEMOGLOBIN A1C
Hgb A1c MFr Bld: 5.9 % — ABNORMAL HIGH (ref 4.8–5.6)
Mean Plasma Glucose: 122.63 mg/dL

## 2021-01-18 LAB — RENAL FUNCTION PANEL
Albumin: 2 g/dL — ABNORMAL LOW (ref 3.5–5.0)
Anion gap: 11 (ref 5–15)
BUN: 27 mg/dL — ABNORMAL HIGH (ref 8–23)
CO2: 22 mmol/L (ref 22–32)
Calcium: 7.9 mg/dL — ABNORMAL LOW (ref 8.9–10.3)
Chloride: 97 mmol/L — ABNORMAL LOW (ref 98–111)
Creatinine, Ser: 4.09 mg/dL — ABNORMAL HIGH (ref 0.61–1.24)
GFR, Estimated: 15 mL/min — ABNORMAL LOW (ref >60)
Glucose, Bld: 167 mg/dL — ABNORMAL HIGH (ref 70–99)
Phosphorus: 6.5 mg/dL — ABNORMAL HIGH (ref 2.5–4.6)
Potassium: 4.4 mmol/L (ref 3.5–5.1)
Sodium: 130 mmol/L — ABNORMAL LOW (ref 135–145)

## 2021-01-18 LAB — HEMOGLOBIN AND HEMATOCRIT, BLOOD
HCT: 24.7 % — ABNORMAL LOW (ref 39.0–52.0)
HCT: 24 % — ABNORMAL LOW (ref 39.0–52.0)
Hemoglobin: 8.2 g/dL — ABNORMAL LOW (ref 13.0–17.0)
Hemoglobin: 8 g/dL — ABNORMAL LOW (ref 13.0–17.0)

## 2021-01-18 LAB — MAGNESIUM: Magnesium: 1.5 mg/dL — ABNORMAL LOW (ref 1.7–2.4)

## 2021-01-18 MED ORDER — SODIUM CHLORIDE 0.9% IV SOLUTION: Freq: Once | INTRAVENOUS | Status: AC

## 2021-01-18 MED ORDER — PEG-KCL-NACL-NASULF-NA ASC-C 100 G PO SOLR: 1.0000 | Freq: Once | ORAL | Status: DC

## 2021-01-18 MED ORDER — ALTEPLASE 2 MG IJ SOLR: 2.0000 mg | Freq: Once | INTRAMUSCULAR | Status: DC | PRN

## 2021-01-18 MED ORDER — METOPROLOL SUCCINATE ER 25 MG PO TB24
25.0000 mg | ORAL_TABLET | ORAL | Status: DC
  Filled 2021-01-18: qty 1

## 2021-01-18 MED ORDER — GABAPENTIN 100 MG PO CAPS
100.0000 mg | ORAL_CAPSULE | Freq: Every day | ORAL | Status: DC
  Administered 2021-01-18 – 2021-01-19 (×2): 100 mg via ORAL
  Filled 2021-01-18 (×2): qty 1

## 2021-01-18 MED ORDER — PANTOPRAZOLE SODIUM 40 MG PO TBEC
80.0000 mg | DELAYED_RELEASE_TABLET | Freq: Every day | ORAL | Status: DC
  Administered 2021-01-18 – 2021-01-20 (×3): 80 mg via ORAL
  Filled 2021-01-18 (×3): qty 2

## 2021-01-18 MED ORDER — OXYCODONE-ACETAMINOPHEN 5-325 MG PO TABS: 1.0000 | ORAL_TABLET | ORAL | Status: DC | PRN

## 2021-01-18 MED ORDER — HEPARIN SODIUM (PORCINE) 1000 UNIT/ML DIALYSIS: 1000.0000 [IU] | INTRAMUSCULAR | Status: DC | PRN

## 2021-01-18 MED ORDER — SPIRONOLACTONE 25 MG PO TABS
50.0000 mg | ORAL_TABLET | Freq: Every morning | ORAL | Status: DC
  Administered 2021-01-18 – 2021-01-20 (×3): 50 mg via ORAL
  Filled 2021-01-18 (×3): qty 2

## 2021-01-18 MED ORDER — LEVOFLOXACIN 500 MG PO TABS
500.0000 mg | ORAL_TABLET | ORAL | Status: DC
  Filled 2021-01-18: qty 1

## 2021-01-18 MED ORDER — MIRTAZAPINE 15 MG PO TABS
30.0000 mg | ORAL_TABLET | Freq: Every day | ORAL | Status: DC
  Administered 2021-01-18 – 2021-01-19 (×2): 30 mg via ORAL
  Filled 2021-01-18 (×2): qty 2

## 2021-01-18 MED ORDER — THIAMINE HCL 100 MG PO TABS
100.0000 mg | ORAL_TABLET | Freq: Every morning | ORAL | Status: DC
  Administered 2021-01-18 – 2021-01-20 (×3): 100 mg via ORAL
  Filled 2021-01-18 (×4): qty 1

## 2021-01-18 MED ORDER — PEG-KCL-NACL-NASULF-NA ASC-C 100 G PO SOLR
0.5000 | Freq: Once | ORAL | Status: AC
  Administered 2021-01-18: 100 g via ORAL
  Filled 2021-01-18: qty 1

## 2021-01-18 MED ORDER — MIDODRINE HCL 5 MG PO TABS
10.0000 mg | ORAL_TABLET | Freq: Two times a day (BID) | ORAL | Status: DC
  Administered 2021-01-18 – 2021-01-20 (×3): 10 mg via ORAL
  Filled 2021-01-18 (×3): qty 2

## 2021-01-18 MED ORDER — PEG-KCL-NACL-NASULF-NA ASC-C 100 G PO SOLR
0.5000 | Freq: Once | ORAL | Status: AC
  Administered 2021-01-18: 100 g via ORAL
  Filled 2021-01-18 (×2): qty 1

## 2021-01-18 MED ORDER — METOPROLOL SUCCINATE ER 25 MG PO TB24: 25.0000 mg | ORAL_TABLET | Freq: Every day | ORAL | Status: DC

## 2021-01-18 MED ORDER — RIFAXIMIN 550 MG PO TABS
550.0000 mg | ORAL_TABLET | Freq: Two times a day (BID) | ORAL | Status: DC
  Administered 2021-01-18 – 2021-01-20 (×3): 550 mg via ORAL
  Filled 2021-01-18 (×6): qty 1

## 2021-01-18 MED ORDER — LACTULOSE 10 GM/15ML PO SOLN
30.0000 g | Freq: Every day | ORAL | Status: DC
  Administered 2021-01-18: 30 g via ORAL
  Filled 2021-01-18 (×2): qty 45

## 2021-01-18 MED ORDER — METOPROLOL TARTRATE 12.5 MG HALF TABLET
12.5000 mg | ORAL_TABLET | Freq: Two times a day (BID) | ORAL | Status: DC
  Administered 2021-01-18: 12.5 mg via ORAL
  Filled 2021-01-18: qty 1

## 2021-01-18 MED ORDER — LACTULOSE 10 GM/15ML PO SOLN: 30.0000 g | ORAL | Status: DC

## 2021-01-18 MED ORDER — DILTIAZEM HCL ER COATED BEADS 120 MG PO CP24
120.0000 mg | ORAL_CAPSULE | Freq: Every day | ORAL | Status: DC
  Filled 2021-01-18: qty 1

## 2021-01-18 MED ORDER — VENLAFAXINE HCL 75 MG PO TABS
75.0000 mg | ORAL_TABLET | Freq: Three times a day (TID) | ORAL | Status: DC
  Administered 2021-01-18 – 2021-01-20 (×4): 75 mg via ORAL
  Filled 2021-01-18 (×10): qty 1

## 2021-01-18 MED ORDER — ALPRAZOLAM 0.5 MG PO TABS: 0.5000 mg | ORAL_TABLET | ORAL | Status: DC

## 2021-01-18 MED ORDER — FUROSEMIDE 40 MG PO TABS
80.0000 mg | ORAL_TABLET | Freq: Every day | ORAL | Status: DC
  Administered 2021-01-18 – 2021-01-20 (×3): 80 mg via ORAL
  Filled 2021-01-18 (×3): qty 2

## 2021-01-18 MED ORDER — TIZANIDINE HCL 4 MG PO TABS: 2.0000 mg | ORAL_TABLET | Freq: Two times a day (BID) | ORAL | Status: DC | PRN

## 2021-01-18 MED ORDER — HYDROXYZINE HCL 25 MG PO TABS
25.0000 mg | ORAL_TABLET | Freq: Three times a day (TID) | ORAL | Status: DC
  Administered 2021-01-18 – 2021-01-20 (×5): 25 mg via ORAL
  Filled 2021-01-18 (×5): qty 1

## 2021-01-18 MED ORDER — PRAVASTATIN SODIUM 10 MG PO TABS
20.0000 mg | ORAL_TABLET | Freq: Every morning | ORAL | Status: DC
  Administered 2021-01-18 – 2021-01-20 (×3): 20 mg via ORAL
  Filled 2021-01-18 (×3): qty 2

## 2021-01-18 MED ORDER — OMEGA-3-ACID ETHYL ESTERS 1 G PO CAPS
2.0000 g | ORAL_CAPSULE | Freq: Two times a day (BID) | ORAL | Status: DC
  Administered 2021-01-18 – 2021-01-20 (×3): 2 g via ORAL
  Filled 2021-01-18 (×5): qty 2

## 2021-01-18 NOTE — H&P (View-Only) (Signed)
[facility] Consult: 8:16 AM 01/18/2021  LOS: 1 day    Referring Provider: Dr Erlinda Hong  Primary Care Physician:  Monico Blitz, MD Primary Gastroenterologist:  [facility]    Reason for Consultation:  Hematochezia after polypectomy   HPI: Jeffrey [name] is a 75 y.o. male.  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  aFP January 2022 1.7 atrial fibrillation, no current anticoagulation meds.  C. difficile colitis.  GERD.  Splenomegaly, thrombocytopenia.  IDDM.  Hepatic encephalopathy.  Iron deficiency anemia.  Ascites maintained with Aldactone/Lasix and paracentesis ( ~3.5 L per tap )twice a month.  Bilateral pleural effusions.  Umbilical hernia repair.  Latest CT abdomen pelvis 04/2020.  Cirrhosis, no suspicious liver lesions.  Gallstones.  Splenomegaly.  Perisplenic varices.  No definitive esophageal or gastric varices.  Moderate ascites. 11/07/2020 ultrasound abdomen with cirrhosis, severe splenomegaly.  No focal hepatic lesions.  Gallbladder stones and sludge.  Minimal to mild ascites.  01/16/2021 colonoscopy, EGD with Dr. Gala Romney.For evaluation FOBT positive.   EGD showed portal hypertensive gastropathy, grade 2 esophageal varices with stigmata of bleeding treated with band ligation x 7.   Colonoscopy revealed portal colopathy.  Large rectal varices.  Splenic flexure polyp which was 4 cm, soft, friable and bled easily.  This was biopsied. Path: Acutely inflamed colonic mucosa with local ulceration and prominent reactive changes.  2 other 1 cm polyps in the sigmoid and descending colon were left in place, not biopsied.  Left-sided diverticulosis.  Dr. Gala Romney doubted that the bleeding was from the splenic flexure polyp.  Felt findings on EGD were concerning for variceal hemorrhage. Relevant meds following  procedure for Anusol rectal cream Aldactone 50 mg daily, omeprazole 80 mg daily, milk thistle, midodrine bid, metoprolol, lactulose 45 mL bid.    Returned to [facility], ED yesterday after passing several episodes of painless hematochezia.  Felt weak and a bit short of breath.  Hb 5.9 (9.1 to 10.2 in the 5 days prior).  Transfused 1 PRBC and transferred to [facility].  Platelets 106 (96 5 d prior).  Had not gone to dialysis.  Octreotide drip initiated.  Recheck Hgb 6.5 >> 2 PRBCs >> 5.9 >> 2 additional PRBCs ordered.   Platelets 115.  INR 1.4 Na 125, baseline 126 - 130.  T bili 1.  Alk phos 146.  AST/ALT 20/15.  Current vital signs BP 180/60.  Pulse 93.  96% saturation on 2 L nasal cannula oxygen.  No fever.  Still feeling weak.  Several episodes hematochezia overnight.  Heading to HD soon.     Past Medical History:  Diagnosis Date   A-fib [facility])    when initially stating dialysis january 2021 at [facility] per spouse; never heard anything else about it   Alcoholic cirrhosis [facility])    patient reports completing hep a and b vaccines in 2006   Anemia    has had 3 units of prbcs 2013   Anxiety    Arthritis    Asymptomatic gallstones    Ultrasound in 2006  B12 deficiency 11/21/2020   C. difficile colitis    Cholelithiasis    Chronic kidney disease    Dialysis M/W/F/Sa   Depression    Diabetes ([place])    Duodenal ulcer with hemorrhage    per patient in 2006 or 2007, records have been requested   Dyspnea    low oxygen level -  has O2 2 L all day   Esophageal varices (HCC)    see [facility]   GERD (gastroesophageal reflux disease)    Heart burn    History of alcohol abuse    quit 05/2013   HOH (hard of hearing)    Hypertension    Liver cirrhosis ([place])    SBP (spontaneous bacterial peritonitis) ([place])    2020, November 2021.    Splenomegaly    Ultrasound in 2006    Past Surgical History:  Procedure Laterality Date   AGILE CAPSULE N/A 09/20/2019   Procedure: AGILE CAPSULE;   Surgeon: Daneil Dolin, MD;  [facility];  Service: Endoscopy;  Laterality: N/A;   APPENDECTOMY     AV FISTULA PLACEMENT Left 08/17/2019   Procedure: ARTERIOVENOUS (AV) FISTULA CREATION VERSES ARTERIOVENOUS GRAFT;  Surgeon: [name], MD;  Location: [place];  Service: Vascular;  Laterality: Left;   BASCILIC VEIN TRANSPOSITION Left 09/28/2019   Procedure: LEFT SECOND STAGE Jansen;  Surgeon: [name], MD;  Location: [place];  Service: Vascular;  Laterality: Left;   BIOPSY  02/03/2018   Procedure: BIOPSY;  Surgeon: Daneil Dolin, MD;  [facility];  Service: Endoscopy;;  bx of gastric polyps   BIOPSY  09/19/2019   Procedure: BIOPSY;  Surgeon: Danie Binder, MD;  [facility];  Service: Endoscopy;;   CATARACT EXTRACTION Right    COLONOSCOPY  03/10/2005   Rectal polyp as described above, removed with snare. Left sided  diverticula. The remainder of the colonic mucosa appeared normal. Inflammed polyp on path.   COLONOSCOPY  04/1999   Dr. Thea Silversmith polyps removed,  Path showed hyperplastic   COLONOSCOPY  10/2015   Dr. Britta Mccreedy: diverticulosis, single sessile tubular adenoma 3-39m in size removed from descending colon.    COLONOSCOPY WITH ESOPHAGOGASTRODUODENOSCOPY (EGD)  03/31/2012   RHFG:[id number]. Colonic diverticulosis. tubular adenoma colon   COLONOSCOPY WITH ESOPHAGOGASTRODUODENOSCOPY (EGD)  06/2019   FORSYTH: small esophageal varices without high risk stigmata, single large AVM on lesser curvature in gastric body s/p APC ablation, gastric antral and duodenal bulb polyposis. No significant source to explain transfusion dependent anemia. Colonoscopy with portal colopathy and diffuse edema, changes of Cdiff colitis on colonoscopy   ESOPHAGEAL BANDING N/A 02/03/2018   Procedure: ESOPHAGEAL BANDING;  Surgeon: RDaneil Dolin MD;  [facility];  Service: Endoscopy;  Laterality: N/A;   ESOPHAGOGASTRODUODENOSCOPY  01/15/2005    Three columns grade 1 to 2 esophageal varices, otherwise normal esophageal mucosa.  Esophagus was not manipulated otherwise./Nodularity of the antrum with overlying erosions, nonspecific finding. Path showed rare H.pylori   ESOPHAGOGASTRODUODENOSCOPY  09/2008   Dr. RGaylene Brookscolumns of grade 2-3 esoph varices, only one column was prominent. Portal gastropathy, multiple gastrc polyps at antrum, two were 2cm with black eschar, bulbar polyps, bulbar erosions   ESOPHAGOGASTRODUODENOSCOPY  11/2004   Dr. FBrantley Stage3 esoph varices   ESOPHAGOGASTRODUODENOSCOPY  03/31/2012   RMR: 4 columns(3-GR 2, 1-GR1) non-bleeding esophageal varices, portal gastropathy, small HH, early GAVE, multiple gastric polyps    ESOPHAGOGASTRODUODENOSCOPY (EGD) WITH PROPOFOL N/A 02/03/2018   Dr.  Rourk: Esophageal varices, 3 columns grade 1-2.  Portal hypertensive gastropathy.  Multiple gastric polyps, biopsy consistent with hyperplastic.   ESOPHAGOGASTRODUODENOSCOPY (EGD) WITH PROPOFOL N/A 09/19/2019   Fields: grade I and II esophageal varcies, mild portal hypertensive gastropathy, moderate gastritis but no H. pylori, single hyperplastic gastric polyp removed, obvious source for melena/transfusion dependent anemia not identified, may be due to friable gastric and duodenal mucosa in the setting of portal hypertension   GASTRIC VARICES BANDING  03/31/2012   Procedure: GASTRIC VARICES BANDING;  Surgeon: Daneil Dolin, MD;  [facility];  Service: Endoscopy;  Laterality: N/A;   GIVENS CAPSULE STUDY N/A 09/21/2019   Poor study with a lot of debris obstructing much of the view of the first approximate 3 hours out of 6 hours.  No obvious source of bleeding identified.  Sequela of bleeding and old blood seen in the form of"whisps" of blood, blood flecks mostly toward the end of the study.   IR REMOVAL TUN CV CATH W/O FL  02/15/2020   POLYPECTOMY  09/19/2019   Procedure: POLYPECTOMY;  Surgeon: Danie Binder, MD;  [facility];  Service: Endoscopy;;   UMBILICAL HERNIA REPAIR  2017   exploratory laparotomy, abdominal washout    Prior to Admission medications   Medication Sig Start Date End Date Taking? Authorizing Provider  acetaminophen (TYLENOL) 650 MG CR tablet Take 650 mg by mouth every 8 (eight) hours as needed for pain.   Yes [provider]  ALPRAZolam Duanne Moron) 0.5 MG tablet Take 0.5 mg by mouth See admin instructions. Take one tablet (0.5 mg) by mouth on Monday, Wednesday, Friday before dialysis, may also take one tablet (0.5 mg) daily as needed for anxiety. 09/21/20  Yes [provider]  Ascorbic Acid (VITAMIN C) 1000 MG tablet Take 1,000 mg by mouth in the morning.   Yes [provider]  calcium carbonate (TUMS - DOSED IN MG ELEMENTAL CALCIUM) 500 MG chewable tablet Chew 1 tablet by mouth 3 (three) times daily with meals.   Yes [provider]  Calcium Carbonate-Simethicone (ALKA-SELTZER HEARTBURN + GAS) 750-80 MG CHEW Chew 1-2 tablets by mouth 2 (two) times daily as needed (acid reflux).   Yes [provider]  diltiazem (TIAZAC) 120 MG 24 hr capsule Take 120 mg by mouth daily. 05/28/20  Yes [provider]  furosemide (LASIX) 40 MG tablet Take 80 mg by mouth daily. 12/29/19  Yes [provider]  gabapentin (NEURONTIN) 100 MG capsule TAKE 1 CAPSULE BY MOUTH AT BEDTIME Patient taking differently: Take 100 mg by mouth daily with supper. 05/28/20  Yes Waynetta Sandy, MD  GLUCAGEN HYPOKIT 1 MG SOLR injection Inject 1 mg into the muscle once as needed for low blood sugar. 11/28/20  Yes [provider]  hydrocortisone (ANUSOL-HC) 2.5 % rectal cream Place 1 application rectally 2 (two) times daily. Patient taking differently: Place 1 application rectally 2 (two) times daily as needed for hemorrhoids or anal itching. 03/27/20  Yes [name] S, PA-C  hydrOXYzine (ATARAX/VISTARIL) 25 MG tablet Take 25 mg by mouth 3 (three)  times daily. For itching 11/23/20  Yes [provider]  insulin degludec (TRESIBA FLEXTOUCH) 200 UNIT/ML FlexTouch Pen Inject 120 Units into the skin at bedtime. When blood sugars are up 02/20/20  Yes Barton Dubois, MD  insulin lispro (HUMALOG KWIKPEN) 100 UNIT/ML KwikPen Inject 10-50 Units into the skin 3 (three) times daily with meals. Per sliding scale based on CBG   Yes  [provider]  lactulose (CONSTULOSE) 10 GM/15ML solution TAKE 45 MLS EVERY MORNING AND TAKE 45 MLS EVERY EVENING Patient taking differently: Take 30 g by mouth See admin instructions. Take 45 mls (30 g) by mouth on Sunday, Tuesday, Thursday, Saturday mornings (non-dialysis days) unless going to MD appointment; take 45 mls (30 g) daily at bedtime 07/16/20  Yes Mahala Menghini, PA-C  levofloxacin (LEVAQUIN) 500 MG tablet Take 500 mg 3 times a week after dialysis. Patient taking differently: Take 500 mg by mouth See admin instructions. Take one tablet (500 mg) by mouth on Monday, Wednesday, Friday after dialysis 04/09/20  Yes Rourk, [name], MD  metoprolol succinate (TOPROL-XL) 25 MG 24 hr tablet Take 1 tablet (25 mg total) by mouth daily. Take at bedtime  on Monday ,Wednesday and Friday due to dialaylisis Patient taking differently: Take 25 mg by mouth See admin instructions. Take one tablet (25 mg) by mouth at bedtime every night and take one tablet (25 mg) in morning on  Sun, Tues, Thurs and Sat (non-dialysis days) 01/06/20  Yes Barton Dubois, MD  midodrine (PROAMATINE) 10 MG tablet Take 1 tablet (10 mg total) by mouth 2 (two) times daily with a meal. 04/06/20  Yes Tat, Shanon Brow, MD  milk thistle 175 MG tablet Take 175 mg by mouth 3 (three) times daily.    Yes [provider]  mirtazapine (REMERON) 30 MG tablet Take 1 tablet (30 mg total) by mouth at bedtime. Patient taking differently: Take 30 mg by mouth daily with supper. 02/10/20  Yes Roxan Hockey, MD  Multiple Vitamin (MULTIVITAMIN WITH MINERALS) TABS  tablet Take 1 tablet by mouth in the morning.    Yes [provider]  nystatin (MYCOSTATIN/NYSTOP) powder Apply topically 3 (three) times daily. Into groin area and affected area Patient taking differently: Apply 1 application topically as needed. Into groin area and affected area 01/06/20  Yes Barton Dubois, MD  omega-3 acid ethyl esters (LOVAZA) 1 g capsule Take 2 g by mouth 2 (two) times daily.  04/07/19  Yes [provider]  omeprazole (PRILOSEC) 40 MG capsule Take 40 mg by mouth daily.   Yes [provider]  ondansetron (ZOFRAN) 4 MG tablet Take 4 mg by mouth every 8 (eight) hours as needed for nausea or vomiting.   Yes [provider]  oxyCODONE-acetaminophen (PERCOCET/ROXICET) 5-325 MG tablet Take 1 tablet by mouth every 4 (four) hours as needed for moderate pain. 02/27/20  Yes [provider]  Ped Vitamins ACD Fl-Iron (TRI-VIT/FLUORIDE/IRON PO) Take 1 tablet by mouth daily. 08/01/19  Yes [provider]  pravastatin (PRAVACHOL) 20 MG tablet Take 20 mg by mouth in the morning.    Yes [provider]  Probiotic Product (PROBIOTIC DAILY PO) Take 1 tablet by mouth in the morning.    Yes [provider]  spironolactone (ALDACTONE) 50 MG tablet Take 50 mg by mouth in the morning.    Yes [provider]  thiamine (VITAMIN B-1) 100 MG tablet Take 100 mg by mouth in the morning. 08/27/20  Yes [provider]  tiZANidine (ZANAFLEX) 2 MG tablet Take 2 mg by mouth 2 (two) times daily as needed for muscle spasms.  02/29/20  Yes [provider]  triamcinolone cream (KENALOG) 0.1 % Apply 1 application topically 2 (two) times daily as needed (dry skin Itching).  12/30/17  Yes [provider]  venlafaxine (EFFEXOR) 75 MG tablet Take 75 mg by mouth 3 (three) times daily with meals.  Yes [provider]  XIFAXAN 550 MG TABS tablet TAKE 1 TABLET BY MOUTH TWICE DAILY Patient taking differently: Take 550  mg by mouth 2 (two) times daily. 02/29/20  Yes Mahala Menghini, PA-C  Alcohol Swabs (ALCOHOL PREP) 70 % PADS Apply 1 each topically 3 (three) times daily. 01/31/20   [provider]  iron polysaccharides (NIFEREX) 150 MG capsule Take 150 mg by mouth daily with supper.    [provider]  Lancet Devices (ADJUSTABLE LANCING DEVICE) MISC Apply 1 each topically daily. 05/23/20   [provider]  NOVOFINE 32G X 6 MM MISC Inject into the skin. 01/31/20   [provider]  polyethylene glycol-electrolytes (TRILYTE) 420 g solution Take 4,000 mLs by mouth as directed. Patient not taking: No sig reported 11/19/20   Daneil Dolin, MD    Scheduled Meds:  Derrill Memo ON 01/20/2021] ALPRAZolam  0.5 mg Oral Q M,W,F-HD   Chlorhexidine Gluconate Cloth  6 each Topical Q0600   diltiazem  120 mg Oral Daily   furosemide  80 mg Oral Daily   gabapentin  100 mg Oral Q supper   hydrOXYzine  25 mg Oral TID   insulin aspart  0-5 Units Subcutaneous QHS   insulin aspart  0-6 Units Subcutaneous TID WC   [START ON 01/20/2021] levofloxacin  500 mg Oral Q M,W,F-2000   metoprolol succinate  25 mg Oral QHS   metoprolol succinate  25 mg Oral Q T,Th,S,Su   midodrine  10 mg Oral BID WC   mirtazapine  30 mg Oral Q supper   omega-3 acid ethyl esters  2 g Oral BID   pantoprazole  80 mg Oral Daily   pravastatin  20 mg Oral q AM   rifaximin  550 mg Oral BID   sodium chloride flush  3 mL Intravenous Q12H   spironolactone  50 mg Oral q AM   thiamine  100 mg Oral q AM   venlafaxine  75 mg Oral TID WC   Infusions:  sodium chloride     sodium chloride     sodium chloride     sodium chloride     octreotide  (SANDOSTATIN)    IV infusion 50 mcg/hr (01/18/21 0505)   PRN Meds: sodium chloride, sodium chloride, sodium chloride, alteplase, heparin, lidocaine (PF), lidocaine-prilocaine, oxyCODONE-acetaminophen, pentafluoroprop-tetrafluoroeth, sodium chloride flush, tiZANidine   Allergies as of 01/17/2021  - Review Complete 01/17/2021  Allergen Reaction Noted   Tape Itching, Rash, and Other (See Comments) 09/18/2019    Family History  Problem Relation Age of Onset   Colon cancer Father 78       deceased age 58   Breast cancer Sister        deceased   Diabetes Sister    Stroke Mother    Healthy Son    Healthy Daughter    Lung cancer Neg Hx    Ovarian cancer Neg Hx     Social History   Socioeconomic History   Marital status: Married    Spouse name: Not on file   Number of children: 2   Years of education: Not on file   Highest education level: Not on file  Occupational History   Occupation: RETIRED    Employer: SELF EMPLOYED  Tobacco Use   Smoking status: Former    Packs/day: 2.00    Years: 25.00    Pack years: 50.00    Types: Cigarettes    Quit date: 01/28/1986    Years since quitting: 90.9  Smokeless tobacco: Never  Vaping Use   Vaping Use: Never used  Substance and Sexual Activity   Alcohol use: No    Comment: quit in 05/2013   Drug use: No   Sexual activity: Not Currently  Other Topics Concern   Not on file  Social History Narrative   Two step children from second marriage (divorced) who live with him along with some grandchildren.    Social Determinants of Health   Financial Resource Strain: Not on file  Food Insecurity: Not on file  Transportation Needs: Not on file  Physical Activity: Inactive   Days of Exercise per Week: 0 days   Minutes of Exercise per Session: 0 min  Stress: Not on file  Social Connections: Not on file  Intimate Partner Violence: Not At Risk   Fear of Current or Ex-Partner: No   Emotionally Abused: No   Physically Abused: No   Sexually Abused: No    REVIEW OF SYSTEMS: Constitutional: Ambulates with walker at baseline. ENT:  No nose bleeds Pulm: No shortness of breath.  No cough. CV:  No palpitations, no chest pain.  Chronic lower extremity edema which is worse if he has been sitting or standing for any length of time. GU:   No hematuria, no frequency GI: See HPI.  No nausea, vomiting, anorexia. Heme: Other than recent hematochezia, no unusual bleeding or bruising. Transfusions: As per HPI.  Also received PRBCs in 2013. Neuro:  No headaches, no peripheral tingling or numbness Derm:  No itching, no rash or sores.  Endocrine:  No sweats or chills.  No polyuria or dysuria Immunization: Reviewed.  He has been vaccinated for hepatitis A and B. Travel:  None beyond local counties in last few months.    PHYSICAL EXAM: Vital signs in last 24 hours: Vitals:   01/18/21 0453 01/18/21 0517  BP: (!) 151/50 (!) 167/54  Pulse: 90 88  Resp: (!) 22 20  Temp: 98.1 F (36.7 C) 98.5 F (36.9 C)  SpO2: 100% 100%   Wt Readings from Last 3 Encounters:  01/18/21 93.6 kg  01/16/21 95.3 kg  01/14/21 95.3 kg    General: pleasant.  Alert.  Comfortable.  Looks somewhat chronically ill. Head: No signs of head trauma.  No facial asymmetry. Eyes: Conjunctiva pale.  No scleral icterus. Ears: Not hard of hearing. Nose: No congestion, no discharge Mouth: Dentures in place.  Mucosa is moist, pink, clear. Neck: No JVD, no masses, no thyromegaly. Lungs: Fine crackles in the bases.  No labored breathing.  No cough. Heart: RRR.  Rate in the 90s. Abdomen: Soft, nondistended.  Splenomegaly.  No tenderness.  Active bowel sounds.  Long, complicated looking midline surgical scar, from umbilical hernia repair.  This is well-healed..   Rectal: Deferred. Musc/Skeltl: No obvious joint swelling or deformity. Extremities:  L arm AVF/graft.    Neurologic: Oriented x3.  Able to provide good history.  Moves all 4 limbs.  No asterixis or tremors. Skin: No rash, no sores. Nodes: No cervical adenopathy Psych: Calm, pleasant, cooperative  Intake/Output from previous day: 08/19 0701 - 08/20 0700 In: 1246.5 [P.O.:360; I.V.:236.5; Blood:650] Out: 100 [Urine:100] Intake/Output this shift: No intake/output data recorded.  LAB RESULTS: Recent  Labs    01/17/21 1147 01/17/21 1227 01/18/21 0247  WBC 5.8  --  5.3  HGB 5.9* 6.5* 5.9*  HCT 18.5* 19.0* 17.9*  PLT 106*  --  115*   BMET Lab Results  Component Value Date   NA 125 (L) 01/18/2021  NA 130 (L) 01/18/2021   NA 129 (L) 01/17/2021   K 4.3 01/18/2021   K 4.4 01/18/2021   K 4.3 01/17/2021   CL 94 (L) 01/18/2021   CL 97 (L) 01/18/2021   CL 92 (L) 01/17/2021   CO2 22 01/18/2021   CO2 22 01/18/2021   CO2 22 01/17/2021   GLUCOSE 167 (H) 01/18/2021   GLUCOSE 167 (H) 01/18/2021   GLUCOSE 218 (H) 01/17/2021   BUN 27 (H) 01/18/2021   BUN 27 (H) 01/18/2021   BUN 23 01/17/2021   CREATININE 4.18 (H) 01/18/2021   CREATININE 4.09 (H) 01/18/2021   CREATININE 3.70 (H) 01/17/2021   CALCIUM 7.7 (L) 01/18/2021   CALCIUM 7.9 (L) 01/18/2021   CALCIUM 8.0 (L) 01/17/2021   LFT Recent Labs    01/17/21 1147 01/18/21 0247  PROT 5.3*  --   ALBUMIN 2.3* 2.0*  AST 20  --   ALT 15  --   ALKPHOS 146*  --   BILITOT 1.0  --    PT/INR Lab Results  Component Value Date   INR 1.4 (H) 01/17/2021   INR 1.2 11/07/2020   INR 1.2 08/01/2020   Hepatitis Panel No results for input(s): HEPBSAG, HCVAB, HEPAIGM, HEPBIGM in the last 72 hours. C-Diff No components found for: CDIFF Lipase     Component Value Date/Time   LIPASE 19 03/26/2020 1709    Drugs of Abuse  No results found for: LABOPIA, COCAINSCRNUR, LABBENZ, AMPHETMU, THCU, LABBARB   RADIOLOGY STUDIES: No results found.   IMPRESSION:      Hematochezia following colonoscopy with polypectomy.  Bleeding could be coming from the polypectomy site but other sources could be his rectal varices and portal colopathy.     Blood loss anemia.  Status post multiple PRBCs.     Cirrhosis of the liver due to alcohol.     Thrombocytopenia, prominent splenomegaly.  Ascites.  On Aldactone, Lasix daily and undergoes paracentesis every 2 weeks.     Hepatic encephalopathy.  Chronic meds include lactulose and rifaximin.     ESRD.   On MWF hemodialysis    PLAN:        Plan repeat colonoscopy.  Patient is off to dialysis now.  We will plan to proceed with colonoscopy tomorrow, bowel prep tonight.  Continue clear liquids.  Transfuse PRBCs as planned and follow CBC to guide future transfusions as needed.  Not clear octreotide is indicated but will leave in place for now.   Azucena Freed  01/18/2021, 8:16 AM Phone [phone]   Attending Physician's Attestation   I have taken an interval history, reviewed the chart and examined the patient.   75 year old male with alcoholic cirrhosis with refractory ascites and history of SBP and hepatic encephalopathy as well as end-stage renal disease on dialysis who underwent an EGD and colonoscopy on August 18 does have a positive occult blood test and was found to have esophageal varices with bleeding stigmata which were banded.  Portal hypertensive gastropathy was also noted.  Colonoscopy notable for a friable mass which biopsy showed inflammatory changes only without evidence of malignancy or dysplasia.  Multiple polyps were seen, but none were removed to concern of bleeding risk. The next day, the patient experienced multiple episodes of bloody stool, both bright red and maroon-colored.  He does not think he passed any tarry stools. He presented with a hemoglobin of 6 down from 9 a few days before at [facility].  He was transfused a unit and  transferred to [facility] where he received another 2 units.  His hemoglobin remained at 6.  He has been hemodynamically stable and he tolerated dialysis well today.  He has continued to pass some blood today, but the bleeding has slowed down. His BUN was only 26 suggesting against a major upper GI bleed.  The patient has numerous possible etiologies for his bleeding, but I suspect a lower bleed, namely the biopsied inflammatory mass.  I would suspect only bright red blood per rectum if the rectal varices were bleeding and would expect elevated  BUN +/- hematemesis if it were his esophageal varices.  We will plan for upper and lower endoscopy tomorrow Agree with current management including octreotide, PPI and empiric antibiotics Okay to continue diuretics given hypertensive state and chronic hypervolemia  I agree with the Advanced Practitioner's note, impression, and recommendations with updates and my documentation above.   Dustin Flock, MD [facility]

## 2021-01-18 NOTE — Progress Notes (Signed)
[facility] night shift PCU coverage note.  The nursing staff reported that the patient's hemoglobin level is 5.9 g/dL despite being transfused earlier.  A transfusion of 2 units of PRBC has been ordered.  Tennis Must, MD.

## 2021-01-18 NOTE — Procedures (Signed)
Patient seen and examined on Hemodialysis. BP (!) 180/60 (BP Location: Right Leg)   Pulse 93   Temp 98.5 F (36.9 C) (Oral)   Resp 18   Ht 5\' 6"  (1.676 m)   Wt 93.6 kg   SpO2 96%   BMI 33.31 kg/m   QB 400 mL/ min via AVF, UF goal 3-3.5L--> goal increased d/t volume of pRBCs given  Tolerating treatment without complaints at this time.   Madelon Lips MD [facility] [phone] 9:40 AM

## 2021-01-18 NOTE — Consult Note (Signed)
Reason for Consult: To manage dialysis and dialysis related needs Referring Physician: Dr Hillery Hunter is an 74 y.o. male.  HPI: Pt is a 75M with a PMH sig for ESRD on HD, cirrhosis c/b ascites and varices, DM II, Afib, and h/o GI bleed who is now seen in consultation at the request of Dr . Erlinda Hong for eval and recs re: management of ESRD and provision of HD.  PT had EGD and c-scope Thursday with GI.  Had colonic varices and a mass at the splenic flexure-biopsied.  EGD with G2 varices with bleeding, banded.  He went home and had 4-5 episodes of BRBPR.  Cme back to [facility] and was found to have a Hgb of 5.9.  Transferred to [facility] for possible emergent scopes.  Has received 4 u PRBCs so far.    GI is consulting.    Pt has no complaints today.  He hasn't had any BRBPR today.  On octreotide gtt.  Dialyzes at [facility]- records forthcoming  Past Medical History:  Diagnosis Date   A-fib [facility])    when initially stating dialysis january 2021 at [facility] per spouse; never heard anything else about it   Alcoholic cirrhosis [facility])    patient reports completing hep a and b vaccines in 2006   Anemia    has had 3 units of prbcs 2013   Anxiety    Arthritis    Asymptomatic gallstones    Ultrasound in 2006   B12 deficiency 11/21/2020   C. difficile colitis    Cholelithiasis    Chronic kidney disease    Dialysis M/W/F/Sa   Depression    Diabetes ([place])    Duodenal ulcer with hemorrhage    per patient in 2006 or 2007, records have been requested   Dyspnea    low oxygen level -  has O2 2 L all day   Esophageal varices ([place])    see [facility]   GERD (gastroesophageal reflux disease)    Heart burn    History of alcohol abuse    quit 05/2013   HOH (hard of hearing)    Hypertension    Liver cirrhosis ([place])    SBP (spontaneous bacterial peritonitis) ([place])    2020, November 2021.    Splenomegaly    Ultrasound in 2006    Past Surgical History:  Procedure Laterality Date   AGILE CAPSULE N/A  09/20/2019   Procedure: AGILE CAPSULE;  Surgeon: Daneil Dolin, MD;  [facility];  Service: Endoscopy;  Laterality: N/A;   APPENDECTOMY     AV FISTULA PLACEMENT Left 08/17/2019   Procedure: ARTERIOVENOUS (AV) FISTULA CREATION VERSES ARTERIOVENOUS GRAFT;  Surgeon: [name], MD;  Location: [place];  Service: Vascular;  Laterality: Left;   BASCILIC VEIN TRANSPOSITION Left 09/28/2019   Procedure: LEFT SECOND STAGE [place];  Surgeon: [name], MD;  Location: [place];  Service: Vascular;  Laterality: Left;   BIOPSY  02/03/2018   Procedure: BIOPSY;  Surgeon: Daneil Dolin, MD;  [facility];  Service: Endoscopy;;  bx of gastric polyps   BIOPSY  09/19/2019   Procedure: BIOPSY;  Surgeon: Danie Binder, MD;  [facility];  Service: Endoscopy;;   CATARACT EXTRACTION Right    COLONOSCOPY  03/10/2005   Rectal polyp as described above, removed with snare. Left sided  diverticula. The remainder of the colonic mucosa appeared normal. Inflammed polyp on path.   COLONOSCOPY  04/1999  Dr. Thea Silversmith polyps removed,  Path showed hyperplastic   COLONOSCOPY  10/2015   Dr. Britta Mccreedy: diverticulosis, single sessile tubular adenoma 3-41m in size removed from descending colon.    COLONOSCOPY WITH ESOPHAGOGASTRODUODENOSCOPY (EGD)  03/31/2012   [id number]. Colonic diverticulosis. tubular adenoma colon   COLONOSCOPY WITH ESOPHAGOGASTRODUODENOSCOPY (EGD)  06/2019   FORSYTH: small esophageal varices without high risk stigmata, single large AVM on lesser curvature in gastric body s/p APC ablation, gastric antral and duodenal bulb polyposis. No significant source to explain transfusion dependent anemia. Colonoscopy with portal colopathy and diffuse edema, changes of Cdiff colitis on colonoscopy   ESOPHAGEAL BANDING N/A 02/03/2018   Procedure: ESOPHAGEAL BANDING;  Surgeon: RDaneil Dolin MD;  [facility];  Service: Endoscopy;  Laterality: N/A;    ESOPHAGOGASTRODUODENOSCOPY  01/15/2005   Three columns grade 1 to 2 esophageal varices, otherwise normal esophageal mucosa.  Esophagus was not manipulated otherwise./Nodularity of the antrum with overlying erosions, nonspecific finding. Path showed rare H.pylori   ESOPHAGOGASTRODUODENOSCOPY  09/2008   Dr. RGaylene Brookscolumns of grade 2-3 esoph varices, only one column was prominent. Portal gastropathy, multiple gastrc polyps at antrum, two were 2cm with black eschar, bulbar polyps, bulbar erosions   ESOPHAGOGASTRODUODENOSCOPY  11/2004   Dr. FBrantley Stage3 esoph varices   ESOPHAGOGASTRODUODENOSCOPY  03/31/2012   RMR: 4 columns(3-GR 2, 1-GR1) non-bleeding esophageal varices, portal gastropathy, small HH, early GAVE, multiple gastric polyps    ESOPHAGOGASTRODUODENOSCOPY (EGD) WITH PROPOFOL N/A 02/03/2018   Dr. RGala Romney Esophageal varices, 3 columns grade 1-2.  Portal hypertensive gastropathy.  Multiple gastric polyps, biopsy consistent with hyperplastic.   ESOPHAGOGASTRODUODENOSCOPY (EGD) WITH PROPOFOL N/A 09/19/2019   Fields: grade I and II esophageal varcies, mild portal hypertensive gastropathy, moderate gastritis but no H. pylori, single hyperplastic gastric polyp removed, obvious source for melena/transfusion dependent anemia not identified, may be due to friable gastric and duodenal mucosa in the setting of portal hypertension   GASTRIC VARICES BANDING  03/31/2012   Procedure: GASTRIC VARICES BANDING;  Surgeon: RDaneil Dolin MD;  [facility];  Service: Endoscopy;  Laterality: N/A;   GIVENS CAPSULE STUDY N/A 09/21/2019   Poor study with a lot of debris obstructing much of the view of the first approximate 3 hours out of 6 hours.  No obvious source of bleeding identified.  Sequela of bleeding and old blood seen in the form of"whisps" of blood, blood flecks mostly toward the end of the study.   IR REMOVAL TUN CV CATH W/O FL  02/15/2020   POLYPECTOMY  09/19/2019   Procedure: POLYPECTOMY;   Surgeon: FDanie Binder MD;  [facility];  Service: Endoscopy;;   UMBILICAL HERNIA REPAIR  2017   exploratory laparotomy, abdominal washout    Family History  Problem Relation Age of Onset   Colon cancer Father 657      deceased age 549  Breast cancer Sister        deceased   Diabetes Sister    Stroke Mother    Healthy Son    Healthy Daughter    Lung cancer Neg Hx    Ovarian cancer Neg Hx     Social History:  reports that he quit smoking about 34 years ago. His smoking use included cigarettes. He has a 50.00 pack-year smoking history. He has never used smokeless tobacco. He reports that he does not drink alcohol and does not use drugs.  Allergies:  Allergies  Allergen Reactions   Tape Itching, Rash and  Other (See Comments)    Tears skin    Medications: Scheduled:  [START ON 01/20/2021] ALPRAZolam  0.5 mg Oral Q M,W,F-HD   Chlorhexidine Gluconate Cloth  6 each Topical Q0600   diltiazem  120 mg Oral Daily   furosemide  80 mg Oral Daily   gabapentin  100 mg Oral Q supper   hydrOXYzine  25 mg Oral TID   insulin aspart  0-5 Units Subcutaneous QHS   insulin aspart  0-6 Units Subcutaneous TID WC   [START ON 01/20/2021] levofloxacin  500 mg Oral Q M,W,F-2000   metoprolol succinate  25 mg Oral QHS   metoprolol succinate  25 mg Oral Q T,Th,S,Su   midodrine  10 mg Oral BID WC   mirtazapine  30 mg Oral Q supper   omega-3 acid ethyl esters  2 g Oral BID   pantoprazole  80 mg Oral Daily   peg 3350 powder  1 kit Oral Once   pravastatin  20 mg Oral q AM   rifaximin  550 mg Oral BID   sodium chloride flush  3 mL Intravenous Q12H   spironolactone  50 mg Oral q AM   thiamine  100 mg Oral q AM   venlafaxine  75 mg Oral TID WC     Results for orders placed or performed during the hospital encounter of 01/17/21 (from the past 48 hour(s))  CBC with Differential/Platelet     Status: Abnormal   Collection Time: 01/17/21 11:47 AM  Result Value Ref Range   WBC 5.8 4.0 -  10.5 K/uL   RBC 1.77 (L) 4.22 - 5.81 MIL/uL   Hemoglobin 5.9 (LL) 13.0 - 17.0 g/dL    Comment: REPEATED TO VERIFY THIS CRITICAL RESULT HAS VERIFIED AND BEEN CALLED TO [facility] BY LATISHA HENDERSON ON 08 19 2022 AT 1242, AND HAS BEEN READ BACK.     HCT 18.5 (L) 39.0 - 52.0 %   MCV 104.5 (H) 80.0 - 100.0 fL   MCH 33.3 26.0 - 34.0 pg   MCHC 31.9 30.0 - 36.0 g/dL   RDW 17.7 (H) 11.5 - 15.5 %   Platelets 106 (L) 150 - 400 K/uL    Comment: CONSISTENT WITH PREVIOUS RESULT REPEATED TO VERIFY    nRBC 0.0 0.0 - 0.2 %   Neutrophils Relative % 84 %   Neutro Abs 4.9 1.7 - 7.7 K/uL   Lymphocytes Relative 8 %   Lymphs Abs 0.4 (L) 0.7 - 4.0 K/uL   Monocytes Relative 5 %   Monocytes Absolute 0.3 0.1 - 1.0 K/uL   Eosinophils Relative 2 %   Eosinophils Absolute 0.1 0.0 - 0.5 K/uL   Basophils Relative 1 %   Basophils Absolute 0.0 0.0 - 0.1 K/uL   Immature Granulocytes 0 %   Abs Immature Granulocytes 0.02 0.00 - 0.07 K/uL    Comment: Performed at [facility], [address] [place], [place] [address]  Comprehensive metabolic panel     Status: Abnormal   Collection Time: 01/17/21 11:47 AM  Result Value Ref Range   Sodium 127 (L) 135 - 145 mmol/L   Potassium 4.4 3.5 - 5.1 mmol/L   Chloride 94 (L) 98 - 111 mmol/L   CO2 22 22 - 32 mmol/L   Glucose, Bld 221 (H) 70 - 99 mg/dL    Comment: Glucose reference range applies only to samples taken after fasting for at least 8 hours.   BUN 26 (H) 8 - 23 mg/dL   Creatinine, Ser 3.68 (  H) 0.61 - 1.24 mg/dL   Calcium 8.0 (L) 8.9 - 10.3 mg/dL   Total Protein 5.3 (L) 6.5 - 8.1 g/dL   Albumin 2.3 (L) 3.5 - 5.0 g/dL   AST 20 15 - 41 U/L   ALT 15 0 - 44 U/L   Alkaline Phosphatase 146 (H) 38 - 126 U/L   Total Bilirubin 1.0 0.3 - 1.2 mg/dL   GFR, Estimated 17 (L) >60 mL/min    Comment: (NOTE) Calculated using the CKD-EPI Creatinine Equation (2021)    Anion gap 11 5 - 15    Comment: Performed at [facility], [address], [place], [place] [address]  Type and  screen     Status: None (Preliminary result)   Collection Time: 01/17/21 11:47 AM  Result Value Ref Range   ABO/RH(D) O POS    Antibody Screen NEG    Sample Expiration 01/20/2021,2359    Unit Number [id number]    Blood Component Type RED CELLS,LR    Unit division 00    Status of Unit ISSUED    Transfusion Status OK TO TRANSFUSE    Crossmatch Result      Compatible Performed at [facility], [address] [place], [place] [address]    Unit Number [id number]    Blood Component Type RBC LR PHER2    Unit division 00    Status of Unit ALLOCATED    Transfusion Status OK TO TRANSFUSE    Crossmatch Result Compatible   I-stat chem 8, ED (not at [facility] or [facility])     Status: Abnormal   Collection Time: 01/17/21 12:27 PM  Result Value Ref Range   Sodium 129 (L) 135 - 145 mmol/L   Potassium 4.3 3.5 - 5.1 mmol/L   Chloride 92 (L) 98 - 111 mmol/L   BUN 23 8 - 23 mg/dL   Creatinine, Ser 3.70 (H) 0.61 - 1.24 mg/dL   Glucose, Bld 218 (H) 70 - 99 mg/dL    Comment: Glucose reference range applies only to samples taken after fasting for at least 8 hours.   Calcium, Ion 1.09 (L) 1.15 - 1.40 mmol/L   TCO2 24 22 - 32 mmol/L   Hemoglobin 6.5 (LL) 13.0 - 17.0 g/dL   HCT 19.0 (L) 39.0 - 52.0 %   Comment NOTIFIED PHYSICIAN   Prepare RBC (crossmatch)     Status: None   Collection Time: 01/17/21 12:48 PM  Result Value Ref Range   Order Confirmation      ORDER PROCESSED BY BLOOD BANK Performed at [facility], [address], [place], [place] [address]   Protime-INR     Status: Abnormal   Collection Time: 01/17/21 12:50 PM  Result Value Ref Range   Prothrombin Time 16.7 (H) 11.4 - 15.2 seconds   INR 1.4 (H) 0.8 - 1.2    Comment: (NOTE) INR goal varies based on device and disease states. Performed at [facility], [address] [place], [place] [address]   Prepare RBC (crossmatch)     Status: None   Collection Time: 01/17/21  5:25 PM  Result Value Ref Range   Order Confirmation      ORDER  PROCESSED BY BLOOD BANK Performed at [facility], [place] [address], [place],  [address]   Type and screen [place]     Status: None (Preliminary result)   Collection Time: 01/17/21  6:05 PM  Result Value Ref Range   ABO/RH(D) O POS    Antibody Screen  NEG    Sample Expiration 01/20/2021,2359    Unit Number [id number]    Blood Component Type RED CELLS,LR    Unit division 00    Status of Unit ISSUED    Transfusion Status OK TO TRANSFUSE    Crossmatch Result Compatible    Unit Number [id number]    Blood Component Type RED CELLS,LR    Unit division 00    Status of Unit ISSUED    Transfusion Status OK TO TRANSFUSE    Crossmatch Result      Compatible Performed at [facility], [address], [place], [place] [address]    Unit Number [id number]    Blood Component Type RED CELLS,LR    Unit division 00    Status of Unit ALLOCATED    Transfusion Status OK TO TRANSFUSE    Crossmatch Result Compatible   SARS CORONAVIRUS 2 (TAT 6-24 HRS) Nasopharyngeal Nasopharyngeal Swab     Status: None   Collection Time: 01/17/21  7:53 PM   Specimen: Nasopharyngeal Swab  Result Value Ref Range   SARS Coronavirus 2 NEGATIVE NEGATIVE    Comment: (NOTE) SARS-CoV-2 target nucleic acids are NOT DETECTED.  The SARS-CoV-2 RNA is generally detectable in upper and lower respiratory specimens during the acute phase of infection. Negative results do not preclude SARS-CoV-2 infection, do not rule out co-infections with other pathogens, and should not be used as the sole basis for treatment or other patient management decisions. Negative results must be combined with clinical observations, patient history, and epidemiological information. The expected result is Negative.  Fact Sheet for Patients: SugarRoll.be  Fact Sheet for Healthcare Providers: https://www.woods-mathews.com/  This test is not yet approved or  cleared by the [place] FDA and  has been authorized for detection and/or diagnosis of SARS-CoV-2 by FDA under an Emergency Use Authorization (EUA). This EUA will remain  in effect (meaning this test can be used) for the duration of the COVID-19 declaration under Se ction 564(b)(1) of the Act, 21 U.S.C. section 360bbb-3(b)(1), unless the authorization is terminated or revoked sooner.  Performed at [facility], [place] [address], [place], [place] [address]   Glucose, capillary     Status: Abnormal   Collection Time: 01/17/21  9:25 PM  Result Value Ref Range   Glucose-Capillary 133 (H) 70 - 99 mg/dL    Comment: Glucose reference range applies only to samples taken after fasting for at least 8 hours.   Comment 1 Notify RN    Comment 2 Document in Chart   Glucose, capillary     Status: Abnormal   Collection Time: 01/18/21 12:00 AM  Result Value Ref Range   Glucose-Capillary 156 (H) 70 - 99 mg/dL    Comment: Glucose reference range applies only to samples taken after fasting for at least 8 hours.   Comment 1 Notify RN    Comment 2 Document in Chart   Basic metabolic panel     Status: Abnormal   Collection Time: 01/18/21  2:47 AM  Result Value Ref Range   Sodium 125 (L) 135 - 145 mmol/L   Potassium 4.3 3.5 - 5.1 mmol/L   Chloride 94 (L) 98 - 111 mmol/L   CO2 22 22 - 32 mmol/L   Glucose, Bld 167 (H) 70 - 99 mg/dL    Comment: Glucose reference range applies only to samples taken after fasting for at least 8 hours.   BUN 27 (H) 8 - 23 mg/dL   Creatinine, Ser 4.18 (  H) 0.61 - 1.24 mg/dL   Calcium 7.7 (L) 8.9 - 10.3 mg/dL   GFR, Estimated 14 (L) >60 mL/min    Comment: (NOTE) Calculated using the CKD-EPI Creatinine Equation (2021)    Anion gap 9 5 - 15    Comment: Performed at [facility] [address] [place], [place] [address]  CBC     Status: Abnormal   Collection Time: 01/18/21  2:47 AM  Result Value Ref Range   WBC 5.3 4.0 - 10.5 K/uL   RBC 1.86 (L) 4.22 -  5.81 MIL/uL   Hemoglobin 5.9 (LL) 13.0 - 17.0 g/dL    Comment: This critical result has verified and been called to RN Loraine Leriche by Messan Houegnifio on 08 20 2022 at 0419, and has been read back.    HCT 17.9 (L) 39.0 - 52.0 %   MCV 96.2 80.0 - 100.0 fL   MCH 31.7 26.0 - 34.0 pg   MCHC 33.0 30.0 - 36.0 g/dL   RDW 18.5 (H) 11.5 - 15.5 %   Platelets 115 (L) 150 - 400 K/uL    Comment: Immature Platelet Fraction may be clinically indicated, consider ordering this additional test [id number] REPEATED TO VERIFY PLATELET COUNT CONFIRMED BY SMEAR    nRBC 0.0 0.0 - 0.2 %    Comment: Performed at [facility], [place] [address], [place], [place] [address]  Hemoglobin A1c     Status: Abnormal   Collection Time: 01/18/21  2:47 AM  Result Value Ref Range   Hgb A1c MFr Bld 5.9 (H) 4.8 - 5.6 %    Comment: (NOTE) Pre diabetes:          5.7%-6.4%  Diabetes:              >6.4%  Glycemic control for   <7.0% adults with diabetes    Mean Plasma Glucose 122.63 mg/dL    Comment: Performed at [facility] [address] [place], [place] [address]  Magnesium     Status: Abnormal   Collection Time: 01/18/21  2:47 AM  Result Value Ref Range   Magnesium 1.5 (L) 1.7 - 2.4 mg/dL    Comment: Performed at [facility] [address], [place], [place] [address]  Renal function panel     Status: Abnormal   Collection Time: 01/18/21  2:47 AM  Result Value Ref Range   Sodium 130 (L) 135 - 145 mmol/L   Potassium 4.4 3.5 - 5.1 mmol/L   Chloride 97 (L) 98 - 111 mmol/L   CO2 22 22 - 32 mmol/L   Glucose, Bld 167 (H) 70 - 99 mg/dL    Comment: Glucose reference range applies only to samples taken after fasting for at least 8 hours.   BUN 27 (H) 8 - 23 mg/dL   Creatinine, Ser 4.09 (H) 0.61 - 1.24 mg/dL   Calcium 7.9 (L) 8.9 - 10.3 mg/dL   Phosphorus 6.5 (H) 2.5 - 4.6 mg/dL   Albumin 2.0 (L) 3.5 - 5.0 g/dL   GFR, Estimated 15 (L) >60 mL/min    Comment: (NOTE) Calculated using the CKD-EPI  Creatinine Equation (2021)    Anion gap 11 5 - 15    Comment: Performed at [facility] [address], [place], [place] [address]  Prepare RBC (crossmatch)     Status: None   Collection Time: 01/18/21  4:28 AM  Result Value Ref Range   Order Confirmation  ORDER PROCESSED BY BLOOD BANK Performed at [facility], [place] [address], [place], [place] [address]   Glucose, capillary     Status: Abnormal   Collection Time: 01/18/21  4:38 AM  Result Value Ref Range   Glucose-Capillary 175 (H) 70 - 99 mg/dL    Comment: Glucose reference range applies only to samples taken after fasting for at least 8 hours.   Comment 1 Notify RN    Comment 2 Document in Chart   Glucose, capillary     Status: Abnormal   Collection Time: 01/18/21  5:56 AM  Result Value Ref Range   Glucose-Capillary 158 (H) 70 - 99 mg/dL    Comment: Glucose reference range applies only to samples taken after fasting for at least 8 hours.  Glucose, capillary     Status: Abnormal   Collection Time: 01/18/21  7:18 AM  Result Value Ref Range   Glucose-Capillary 166 (H) 70 - 99 mg/dL    Comment: Glucose reference range applies only to samples taken after fasting for at least 8 hours.    No results found.  ROS: all other systems reviewed and are negative except as per HPI  Blood pressure (!) 180/60, pulse 93, temperature 98.5 F (36.9 C), temperature source Oral, resp. rate 18, height _0  (1.676 m), weight 93.6 kg, SpO2 96 %. GEN: NAD, sitting in bed HEENT EOMI PERRL NECK no JVD PULM clear anteriorly CV RRR today ABD soft, + fluid wave EXT 1+ LE edema NEURO AAO x 3 nonfocal, no asterixis ACCESS L AVF + T/B SKIN: thinned with multiple bruises  Assessment/Plan: 1 GI bleed: in setting of recent EGD and c-scope Thursday.  4 u pRBCs given.  Had mass at splenic flexure biopsied-> ? Potential source (although he has many) GI consulting here.  Overall- he has severe esophageal and colonic varices, 50% 6 mo  mortality.   2 ESRD: normally MWF- missed HD Friday, on HD off schedule today.  No heparin 3 Hypertension: expect to improve with UF 4. Anemia of ESRD/ ABLA: got 4 u pRBCs, will get records for ESA 5. Metabolic Bone Disease: VDRA and binders when eating 6.  DM II: per primary 7.  Ascites: gets taps every 2 weeks- monitor here for need 8.  Dispo: admitted  Madelon Lips 01/18/2021, 9:19 AM

## 2021-01-18 NOTE — Progress Notes (Signed)
Pt arrived to HD unit from rm 3E07  Per report, 3 units prbcs transfused prior to HD, for hematocrit 5.9 - 4th unit ordered. Only symptom was subjective shortness of breath, wishing to have oxygen flow @ 3L.  During tx, blood pressure did not tolerate goal of 3L.  [547mL initially added due to the number of transfusions he had received. ] UF paused intermittently, 4th unit given. Total UFR was 3243, minus 500 rinse, and 350 for blood, net = 2400 mL removed  Pt complaining of abdominal pain throughout treatment, llq, and requested bedpan at end, but only passed gas. Stomach appears distended.  Octreotide gtt running.  Transferred back to sending unit

## 2021-01-18 NOTE — Consult Note (Addendum)
Gastroenterology Consult: 8:16 AM 01/18/2021  LOS: 1 day    Referring Provider: Dr Erlinda Hong  Primary Care Physician:  Monico Blitz, MD Primary Gastroenterologist:  [facility]    Reason for Consultation:  Hematochezia after polypectomy   HPI: Jeffrey [name] is a 75 y.o. male.  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  aFP January 2022 1.7 atrial fibrillation, no current anticoagulation meds.  C. difficile colitis.  GERD.  Splenomegaly, thrombocytopenia.  IDDM.  Hepatic encephalopathy.  Iron deficiency anemia.  Ascites maintained with Aldactone/Lasix and paracentesis ( ~3.5 L per tap )twice a month.  Bilateral pleural effusions.  Umbilical hernia repair.  Latest CT abdomen pelvis 04/2020.  Cirrhosis, no suspicious liver lesions.  Gallstones.  Splenomegaly.  Perisplenic varices.  No definitive esophageal or gastric varices.  Moderate ascites. 11/07/2020 ultrasound abdomen with cirrhosis, severe splenomegaly.  No focal hepatic lesions.  Gallbladder stones and sludge.  Minimal to mild ascites.  01/16/2021 colonoscopy, EGD with Dr. Gala Romney.For evaluation FOBT positive.   EGD showed portal hypertensive gastropathy, grade 2 esophageal varices with stigmata of bleeding treated with band ligation x 7.   Colonoscopy revealed portal colopathy.  Large rectal varices.  Splenic flexure polyp which was 4 cm, soft, friable and bled easily.  This was biopsied. Path: Acutely inflamed colonic mucosa with local ulceration and prominent reactive changes.  2 other 1 cm polyps in the sigmoid and descending colon were left in place, not biopsied.  Left-sided diverticulosis.  Dr. Gala Romney doubted that the bleeding was from the splenic flexure polyp.  Felt findings on EGD were concerning for variceal hemorrhage. Relevant meds following  procedure for Anusol rectal cream Aldactone 50 mg daily, omeprazole 80 mg daily, milk thistle, midodrine bid, metoprolol, lactulose 45 mL bid.    Returned to [facility], ED yesterday after passing several episodes of painless hematochezia.  Felt weak and a bit short of breath.  Hb 5.9 (9.1 to 10.2 in the 5 days prior).  Transfused 1 PRBC and transferred to [facility].  Platelets 106 (96 5 d prior).  Had not gone to dialysis.  Octreotide drip initiated.  Recheck Hgb 6.5 >> 2 PRBCs >> 5.9 >> 2 additional PRBCs ordered.   Platelets 115.  INR 1.4 Na 125, baseline 126 - 130.  T bili 1.  Alk phos 146.  AST/ALT 20/15.  Current vital signs BP 180/60.  Pulse 93.  96% saturation on 2 L nasal cannula oxygen.  No fever.  Still feeling weak.  Several episodes hematochezia overnight.  Heading to HD soon.     Past Medical History:  Diagnosis Date   A-fib [facility])    when initially stating dialysis january 2021 at [facility] per spouse; never heard anything else about it   Alcoholic cirrhosis [facility])    patient reports completing hep a and b vaccines in 2006   Anemia    has had 3 units of prbcs 2013   Anxiety    Arthritis    Asymptomatic gallstones    Ultrasound in 2006  B12 deficiency 11/21/2020   C. difficile colitis    Cholelithiasis    Chronic kidney disease    Dialysis M/W/F/Sa   Depression    Diabetes ([place])    Duodenal ulcer with hemorrhage    per patient in 2006 or 2007, records have been requested   Dyspnea    low oxygen level -  has O2 2 L all day   Esophageal varices (HCC)    see PSH   GERD (gastroesophageal reflux disease)    Heart burn    History of alcohol abuse    quit 05/2013   HOH (hard of hearing)    Hypertension    Liver cirrhosis ([place])    SBP (spontaneous bacterial peritonitis) ([place])    2020, November 2021.    Splenomegaly    Ultrasound in 2006    Past Surgical History:  Procedure Laterality Date   AGILE CAPSULE N/A 09/20/2019   Procedure: AGILE CAPSULE;   Surgeon: Daneil Dolin, MD;  [facility];  Service: Endoscopy;  Laterality: N/A;   APPENDECTOMY     AV FISTULA PLACEMENT Left 08/17/2019   Procedure: ARTERIOVENOUS (AV) FISTULA CREATION VERSES ARTERIOVENOUS GRAFT;  Surgeon: [name], MD;  Location: [place];  Service: Vascular;  Laterality: Left;   BASCILIC VEIN TRANSPOSITION Left 09/28/2019   Procedure: LEFT SECOND STAGE [place];  Surgeon: [name], MD;  Location: [place];  Service: Vascular;  Laterality: Left;   BIOPSY  02/03/2018   Procedure: BIOPSY;  Surgeon: Daneil Dolin, MD;  [facility];  Service: Endoscopy;;  bx of gastric polyps   BIOPSY  09/19/2019   Procedure: BIOPSY;  Surgeon: Danie Binder, MD;  [facility];  Service: Endoscopy;;   CATARACT EXTRACTION Right    COLONOSCOPY  03/10/2005   Rectal polyp as described above, removed with snare. Left sided  diverticula. The remainder of the colonic mucosa appeared normal. Inflammed polyp on path.   COLONOSCOPY  04/1999   Dr. Thea Silversmith polyps removed,  Path showed hyperplastic   COLONOSCOPY  10/2015   Dr. Britta Mccreedy: diverticulosis, single sessile tubular adenoma 3-67m in size removed from descending colon.    COLONOSCOPY WITH ESOPHAGOGASTRODUODENOSCOPY (EGD)  03/31/2012   RNGE:[id number]. Colonic diverticulosis. tubular adenoma colon   COLONOSCOPY WITH ESOPHAGOGASTRODUODENOSCOPY (EGD)  06/2019   FORSYTH: small esophageal varices without high risk stigmata, single large AVM on lesser curvature in gastric body s/p APC ablation, gastric antral and duodenal bulb polyposis. No significant source to explain transfusion dependent anemia. Colonoscopy with portal colopathy and diffuse edema, changes of Cdiff colitis on colonoscopy   ESOPHAGEAL BANDING N/A 02/03/2018   Procedure: ESOPHAGEAL BANDING;  Surgeon: RDaneil Dolin MD;  [facility];  Service: Endoscopy;  Laterality: N/A;   ESOPHAGOGASTRODUODENOSCOPY  01/15/2005    Three columns grade 1 to 2 esophageal varices, otherwise normal esophageal mucosa.  Esophagus was not manipulated otherwise./Nodularity of the antrum with overlying erosions, nonspecific finding. Path showed rare H.pylori   ESOPHAGOGASTRODUODENOSCOPY  09/2008   Dr. RGaylene Brookscolumns of grade 2-3 esoph varices, only one column was prominent. Portal gastropathy, multiple gastrc polyps at antrum, two were 2cm with black eschar, bulbar polyps, bulbar erosions   ESOPHAGOGASTRODUODENOSCOPY  11/2004   Dr. FBrantley Stage3 esoph varices   ESOPHAGOGASTRODUODENOSCOPY  03/31/2012   RMR: 4 columns(3-GR 2, 1-GR1) non-bleeding esophageal varices, portal gastropathy, small HH, early GAVE, multiple gastric polyps    ESOPHAGOGASTRODUODENOSCOPY (EGD) WITH PROPOFOL N/A 02/03/2018   Dr.  Rourk: Esophageal varices, 3 columns grade 1-2.  Portal hypertensive gastropathy.  Multiple gastric polyps, biopsy consistent with hyperplastic.   ESOPHAGOGASTRODUODENOSCOPY (EGD) WITH PROPOFOL N/A 09/19/2019   Fields: grade I and II esophageal varcies, mild portal hypertensive gastropathy, moderate gastritis but no H. pylori, single hyperplastic gastric polyp removed, obvious source for melena/transfusion dependent anemia not identified, may be due to friable gastric and duodenal mucosa in the setting of portal hypertension   GASTRIC VARICES BANDING  03/31/2012   Procedure: GASTRIC VARICES BANDING;  Surgeon: Daneil Dolin, MD;  [facility];  Service: Endoscopy;  Laterality: N/A;   GIVENS CAPSULE STUDY N/A 09/21/2019   Poor study with a lot of debris obstructing much of the view of the first approximate 3 hours out of 6 hours.  No obvious source of bleeding identified.  Sequela of bleeding and old blood seen in the form of"whisps" of blood, blood flecks mostly toward the end of the study.   IR REMOVAL TUN CV CATH W/O FL  02/15/2020   POLYPECTOMY  09/19/2019   Procedure: POLYPECTOMY;  Surgeon: Danie Binder, MD;  [facility];  Service: Endoscopy;;   UMBILICAL HERNIA REPAIR  2017   exploratory laparotomy, abdominal washout    Prior to Admission medications   Medication Sig Start Date End Date Taking? Authorizing Provider  acetaminophen (TYLENOL) 650 MG CR tablet Take 650 mg by mouth every 8 (eight) hours as needed for pain.   Yes [provider]  ALPRAZolam Duanne Moron) 0.5 MG tablet Take 0.5 mg by mouth See admin instructions. Take one tablet (0.5 mg) by mouth on Monday, Wednesday, Friday before dialysis, may also take one tablet (0.5 mg) daily as needed for anxiety. 09/21/20  Yes [provider]  Ascorbic Acid (VITAMIN C) 1000 MG tablet Take 1,000 mg by mouth in the morning.   Yes [provider]  calcium carbonate (TUMS - DOSED IN MG ELEMENTAL CALCIUM) 500 MG chewable tablet Chew 1 tablet by mouth 3 (three) times daily with meals.   Yes [provider]  Calcium Carbonate-Simethicone (ALKA-SELTZER HEARTBURN + GAS) 750-80 MG CHEW Chew 1-2 tablets by mouth 2 (two) times daily as needed (acid reflux).   Yes [provider]  diltiazem (TIAZAC) 120 MG 24 hr capsule Take 120 mg by mouth daily. 05/28/20  Yes [provider]  furosemide (LASIX) 40 MG tablet Take 80 mg by mouth daily. 12/29/19  Yes [provider]  gabapentin (NEURONTIN) 100 MG capsule TAKE 1 CAPSULE BY MOUTH AT BEDTIME Patient taking differently: Take 100 mg by mouth daily with supper. 05/28/20  Yes Waynetta Sandy, MD  GLUCAGEN HYPOKIT 1 MG SOLR injection Inject 1 mg into the muscle once as needed for low blood sugar. 11/28/20  Yes [provider]  hydrocortisone (ANUSOL-HC) 2.5 % rectal cream Place 1 application rectally 2 (two) times daily. Patient taking differently: Place 1 application rectally 2 (two) times daily as needed for hemorrhoids or anal itching. 03/27/20  Yes [name] S, PA-C  hydrOXYzine (ATARAX/VISTARIL) 25 MG tablet Take 25 mg by mouth 3 (three)  times daily. For itching 11/23/20  Yes [provider]  insulin degludec (TRESIBA FLEXTOUCH) 200 UNIT/ML FlexTouch Pen Inject 120 Units into the skin at bedtime. When blood sugars are up 02/20/20  Yes Barton Dubois, MD  insulin lispro (HUMALOG KWIKPEN) 100 UNIT/ML KwikPen Inject 10-50 Units into the skin 3 (three) times daily with meals. Per sliding scale based on CBG   Yes  [provider]  lactulose (CONSTULOSE) 10 GM/15ML solution TAKE 45 MLS EVERY MORNING AND TAKE 45 MLS EVERY EVENING Patient taking differently: Take 30 g by mouth See admin instructions. Take 45 mls (30 g) by mouth on Sunday, Tuesday, Thursday, Saturday mornings (non-dialysis days) unless going to MD appointment; take 45 mls (30 g) daily at bedtime 07/16/20  Yes Mahala Menghini, PA-C  levofloxacin (LEVAQUIN) 500 MG tablet Take 500 mg 3 times a week after dialysis. Patient taking differently: Take 500 mg by mouth See admin instructions. Take one tablet (500 mg) by mouth on Monday, Wednesday, Friday after dialysis 04/09/20  Yes Rourk, [name], MD  metoprolol succinate (TOPROL-XL) 25 MG 24 hr tablet Take 1 tablet (25 mg total) by mouth daily. Take at bedtime  on Monday ,Wednesday and Friday due to dialaylisis Patient taking differently: Take 25 mg by mouth See admin instructions. Take one tablet (25 mg) by mouth at bedtime every night and take one tablet (25 mg) in morning on  Sun, Tues, Thurs and Sat (non-dialysis days) 01/06/20  Yes Barton Dubois, MD  midodrine (PROAMATINE) 10 MG tablet Take 1 tablet (10 mg total) by mouth 2 (two) times daily with a meal. 04/06/20  Yes Tat, Shanon Brow, MD  milk thistle 175 MG tablet Take 175 mg by mouth 3 (three) times daily.    Yes [provider]  mirtazapine (REMERON) 30 MG tablet Take 1 tablet (30 mg total) by mouth at bedtime. Patient taking differently: Take 30 mg by mouth daily with supper. 02/10/20  Yes Roxan Hockey, MD  Multiple Vitamin (MULTIVITAMIN WITH MINERALS) TABS  tablet Take 1 tablet by mouth in the morning.    Yes [provider]  nystatin (MYCOSTATIN/NYSTOP) powder Apply topically 3 (three) times daily. Into groin area and affected area Patient taking differently: Apply 1 application topically as needed. Into groin area and affected area 01/06/20  Yes Barton Dubois, MD  omega-3 acid ethyl esters (LOVAZA) 1 g capsule Take 2 g by mouth 2 (two) times daily.  04/07/19  Yes [provider]  omeprazole (PRILOSEC) 40 MG capsule Take 40 mg by mouth daily.   Yes [provider]  ondansetron (ZOFRAN) 4 MG tablet Take 4 mg by mouth every 8 (eight) hours as needed for nausea or vomiting.   Yes [provider]  oxyCODONE-acetaminophen (PERCOCET/ROXICET) 5-325 MG tablet Take 1 tablet by mouth every 4 (four) hours as needed for moderate pain. 02/27/20  Yes [provider]  Ped Vitamins ACD Fl-Iron (TRI-VIT/FLUORIDE/IRON PO) Take 1 tablet by mouth daily. 08/01/19  Yes [provider]  pravastatin (PRAVACHOL) 20 MG tablet Take 20 mg by mouth in the morning.    Yes [provider]  Probiotic Product (PROBIOTIC DAILY PO) Take 1 tablet by mouth in the morning.    Yes [provider]  spironolactone (ALDACTONE) 50 MG tablet Take 50 mg by mouth in the morning.    Yes [provider]  thiamine (VITAMIN B-1) 100 MG tablet Take 100 mg by mouth in the morning. 08/27/20  Yes [provider]  tiZANidine (ZANAFLEX) 2 MG tablet Take 2 mg by mouth 2 (two) times daily as needed for muscle spasms.  02/29/20  Yes [provider]  triamcinolone cream (KENALOG) 0.1 % Apply 1 application topically 2 (two) times daily as needed (dry skin Itching).  12/30/17  Yes [provider]  venlafaxine (EFFEXOR) 75 MG tablet Take 75 mg by mouth 3 (three) times daily with meals.  Yes [provider]  XIFAXAN 550 MG TABS tablet TAKE 1 TABLET BY MOUTH TWICE DAILY Patient taking differently: Take 550  mg by mouth 2 (two) times daily. 02/29/20  Yes Mahala Menghini, PA-C  Alcohol Swabs (ALCOHOL PREP) 70 % PADS Apply 1 each topically 3 (three) times daily. 01/31/20   [provider]  iron polysaccharides (NIFEREX) 150 MG capsule Take 150 mg by mouth daily with supper.    [provider]  Lancet Devices (ADJUSTABLE LANCING DEVICE) MISC Apply 1 each topically daily. 05/23/20   [provider]  NOVOFINE 32G X 6 MM MISC Inject into the skin. 01/31/20   [provider]  polyethylene glycol-electrolytes (TRILYTE) 420 g solution Take 4,000 mLs by mouth as directed. Patient not taking: No sig reported 11/19/20   Daneil Dolin, MD    Scheduled Meds:  Derrill Memo ON 01/20/2021] ALPRAZolam  0.5 mg Oral Q M,W,F-HD   Chlorhexidine Gluconate Cloth  6 each Topical Q0600   diltiazem  120 mg Oral Daily   furosemide  80 mg Oral Daily   gabapentin  100 mg Oral Q supper   hydrOXYzine  25 mg Oral TID   insulin aspart  0-5 Units Subcutaneous QHS   insulin aspart  0-6 Units Subcutaneous TID WC   [START ON 01/20/2021] levofloxacin  500 mg Oral Q M,W,F-2000   metoprolol succinate  25 mg Oral QHS   metoprolol succinate  25 mg Oral Q T,Th,S,Su   midodrine  10 mg Oral BID WC   mirtazapine  30 mg Oral Q supper   omega-3 acid ethyl esters  2 g Oral BID   pantoprazole  80 mg Oral Daily   pravastatin  20 mg Oral q AM   rifaximin  550 mg Oral BID   sodium chloride flush  3 mL Intravenous Q12H   spironolactone  50 mg Oral q AM   thiamine  100 mg Oral q AM   venlafaxine  75 mg Oral TID WC   Infusions:  sodium chloride     sodium chloride     sodium chloride     sodium chloride     octreotide  (SANDOSTATIN)    IV infusion 50 mcg/hr (01/18/21 0505)   PRN Meds: sodium chloride, sodium chloride, sodium chloride, alteplase, heparin, lidocaine (PF), lidocaine-prilocaine, oxyCODONE-acetaminophen, pentafluoroprop-tetrafluoroeth, sodium chloride flush, tiZANidine   Allergies as of 01/17/2021  - Review Complete 01/17/2021  Allergen Reaction Noted   Tape Itching, Rash, and Other (See Comments) 09/18/2019    Family History  Problem Relation Age of Onset   Colon cancer Father 14       deceased age 57   Breast cancer Sister        deceased   Diabetes Sister    Stroke Mother    Healthy Son    Healthy Daughter    Lung cancer Neg Hx    Ovarian cancer Neg Hx     Social History   Socioeconomic History   Marital status: Married    Spouse name: Not on file   Number of children: 2   Years of education: Not on file   Highest education level: Not on file  Occupational History   Occupation: RETIRED    Employer: SELF EMPLOYED  Tobacco Use   Smoking status: Former    Packs/day: 2.00    Years: 25.00    Pack years: 50.00    Types: Cigarettes    Quit date: 01/28/1986    Years since quitting: 21.9  Smokeless tobacco: Never  Vaping Use   Vaping Use: Never used  Substance and Sexual Activity   Alcohol use: No    Comment: quit in 05/2013   Drug use: No   Sexual activity: Not Currently  Other Topics Concern   Not on file  Social History Narrative   Two step children from second marriage (divorced) who live with him along with some grandchildren.    Social Determinants of Health   Financial Resource Strain: Not on file  Food Insecurity: Not on file  Transportation Needs: Not on file  Physical Activity: Inactive   Days of Exercise per Week: 0 days   Minutes of Exercise per Session: 0 min  Stress: Not on file  Social Connections: Not on file  Intimate Partner Violence: Not At Risk   Fear of Current or Ex-Partner: No   Emotionally Abused: No   Physically Abused: No   Sexually Abused: No    REVIEW OF SYSTEMS: Constitutional: Ambulates with walker at baseline. ENT:  No nose bleeds Pulm: No shortness of breath.  No cough. CV:  No palpitations, no chest pain.  Chronic lower extremity edema which is worse if he has been sitting or standing for any length of time. GU:   No hematuria, no frequency GI: See HPI.  No nausea, vomiting, anorexia. Heme: Other than recent hematochezia, no unusual bleeding or bruising. Transfusions: As per HPI.  Also received PRBCs in 2013. Neuro:  No headaches, no peripheral tingling or numbness Derm:  No itching, no rash or sores.  Endocrine:  No sweats or chills.  No polyuria or dysuria Immunization: Reviewed.  He has been vaccinated for hepatitis A and B. Travel:  None beyond local counties in last few months.    PHYSICAL EXAM: Vital signs in last 24 hours: Vitals:   01/18/21 0453 01/18/21 0517  BP: (!) 151/50 (!) 167/54  Pulse: 90 88  Resp: (!) 22 20  Temp: 98.1 F (36.7 C) 98.5 F (36.9 C)  SpO2: 100% 100%   Wt Readings from Last 3 Encounters:  01/18/21 93.6 kg  01/16/21 95.3 kg  01/14/21 95.3 kg    General: pleasant.  Alert.  Comfortable.  Looks somewhat chronically ill. Head: No signs of head trauma.  No facial asymmetry. Eyes: Conjunctiva pale.  No scleral icterus. Ears: Not hard of hearing. Nose: No congestion, no discharge Mouth: Dentures in place.  Mucosa is moist, pink, clear. Neck: No JVD, no masses, no thyromegaly. Lungs: Fine crackles in the bases.  No labored breathing.  No cough. Heart: RRR.  Rate in the 90s. Abdomen: Soft, nondistended.  Splenomegaly.  No tenderness.  Active bowel sounds.  Long, complicated looking midline surgical scar, from umbilical hernia repair.  This is well-healed..   Rectal: Deferred. Musc/Skeltl: No obvious joint swelling or deformity. Extremities:  L arm AVF/graft.    Neurologic: Oriented x3.  Able to provide good history.  Moves all 4 limbs.  No asterixis or tremors. Skin: No rash, no sores. Nodes: No cervical adenopathy Psych: Calm, pleasant, cooperative  Intake/Output from previous day: 08/19 0701 - 08/20 0700 In: 1246.5 [P.O.:360; I.V.:236.5; Blood:650] Out: 100 [Urine:100] Intake/Output this shift: No intake/output data recorded.  LAB RESULTS: Recent  Labs    01/17/21 1147 01/17/21 1227 01/18/21 0247  WBC 5.8  --  5.3  HGB 5.9* 6.5* 5.9*  HCT 18.5* 19.0* 17.9*  PLT 106*  --  115*   BMET Lab Results  Component Value Date   NA 125 (L) 01/18/2021  NA 130 (L) 01/18/2021   NA 129 (L) 01/17/2021   K 4.3 01/18/2021   K 4.4 01/18/2021   K 4.3 01/17/2021   CL 94 (L) 01/18/2021   CL 97 (L) 01/18/2021   CL 92 (L) 01/17/2021   CO2 22 01/18/2021   CO2 22 01/18/2021   CO2 22 01/17/2021   GLUCOSE 167 (H) 01/18/2021   GLUCOSE 167 (H) 01/18/2021   GLUCOSE 218 (H) 01/17/2021   BUN 27 (H) 01/18/2021   BUN 27 (H) 01/18/2021   BUN 23 01/17/2021   CREATININE 4.18 (H) 01/18/2021   CREATININE 4.09 (H) 01/18/2021   CREATININE 3.70 (H) 01/17/2021   CALCIUM 7.7 (L) 01/18/2021   CALCIUM 7.9 (L) 01/18/2021   CALCIUM 8.0 (L) 01/17/2021   LFT Recent Labs    01/17/21 1147 01/18/21 0247  PROT 5.3*  --   ALBUMIN 2.3* 2.0*  AST 20  --   ALT 15  --   ALKPHOS 146*  --   BILITOT 1.0  --    PT/INR Lab Results  Component Value Date   INR 1.4 (H) 01/17/2021   INR 1.2 11/07/2020   INR 1.2 08/01/2020   Hepatitis Panel No results for input(s): HEPBSAG, HCVAB, HEPAIGM, HEPBIGM in the last 72 hours. C-Diff No components found for: CDIFF Lipase     Component Value Date/Time   LIPASE 19 03/26/2020 1709    Drugs of Abuse  No results found for: LABOPIA, COCAINSCRNUR, LABBENZ, AMPHETMU, THCU, LABBARB   RADIOLOGY STUDIES: No results found.   IMPRESSION:      Hematochezia following colonoscopy with polypectomy.  Bleeding could be coming from the polypectomy site but other sources could be his rectal varices and portal colopathy.     Blood loss anemia.  Status post multiple PRBCs.     Cirrhosis of the liver due to alcohol.     Thrombocytopenia, prominent splenomegaly.  Ascites.  On Aldactone, Lasix daily and undergoes paracentesis every 2 weeks.     Hepatic encephalopathy.  Chronic meds include lactulose and rifaximin.     ESRD.   On MWF hemodialysis    PLAN:        Plan repeat colonoscopy.  Patient is off to dialysis now.  We will plan to proceed with colonoscopy tomorrow, bowel prep tonight.  Continue clear liquids.  Transfuse PRBCs as planned and follow CBC to guide future transfusions as needed.  Not clear octreotide is indicated but will leave in place for now.   Azucena Freed  01/18/2021, 8:16 AM Phone [phone]   Attending Physician's Attestation   I have taken an interval history, reviewed the chart and examined the patient.   75 year old male with alcoholic cirrhosis with refractory ascites and history of SBP and hepatic encephalopathy as well as end-stage renal disease on dialysis who underwent an EGD and colonoscopy on August 18 does have a positive occult blood test and was found to have esophageal varices with bleeding stigmata which were banded.  Portal hypertensive gastropathy was also noted.  Colonoscopy notable for a friable mass which biopsy showed inflammatory changes only without evidence of malignancy or dysplasia.  Multiple polyps were seen, but none were removed to concern of bleeding risk. The next day, the patient experienced multiple episodes of bloody stool, both bright red and maroon-colored.  He does not think he passed any tarry stools. He presented with a hemoglobin of 6 down from 9 a few days before at [facility]  He was transfused a unit and  transferred to [facility] where he received another 2 units.  His hemoglobin remained at 6.  He has been hemodynamically stable and he tolerated dialysis well today.  He has continued to pass some blood today, but the bleeding has slowed down. His BUN was only 26 suggesting against a major upper GI bleed.  The patient has numerous possible etiologies for his bleeding, but I suspect a lower bleed, namely the biopsied inflammatory mass.  I would suspect only bright red blood per rectum if the rectal varices were bleeding and would expect elevated  BUN +/- hematemesis if it were his esophageal varices.  We will plan for upper and lower endoscopy tomorrow Agree with current management including octreotide, PPI and empiric antibiotics Okay to continue diuretics given hypertensive state and chronic hypervolemia  I agree with the Advanced Practitioner's note, impression, and recommendations with updates and my documentation above.   Dustin Flock, MD [facility]

## 2021-01-18 NOTE — Progress Notes (Signed)
Pt had a large BM,bright red with clots.

## 2021-01-18 NOTE — Progress Notes (Addendum)
PROGRESS NOTE    Jeffrey [name]  RSW:[id number] DOB: 18-Jun-1945 DOA: 01/17/2021 PCP: Monico Blitz, MD    Chief Complaint  Patient presents with   Rectal Bleeding    Brief Narrative:  Jeffrey [name] is a 75 y.o. 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 Gala Romney. He went home and had 4-5 episodes of bright red blood per rectum and he started to feel weak so he went to [facility] and was found to have a hemoglobin of 5.9. He was given 1 unit of blood and transferred him to [facility]  Subjective:  Seen on dialysis, getting prbc transfusion Denies pain Reports feeling light headed  On 3liter o2, denies sob at rest, no edema,  Assessment & Plan:   Principal Problem:   GI bleed Active Problems:   Alcoholic cirrhosis of liver with ascites ([place])   Esophageal varices in alcoholic cirrhosis ([place])   Essential hypertension, benign   Mixed hyperlipidemia   Type 2 diabetes mellitus ([place])   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[place])   Acute blood loss anemia   Chronic diastolic HF (heart failure) ([facility])   Hyponatremia   Thrombocytopenia ([facility])  Acute blood loss anemia -Report hematochezia following colonoscopy with polypectomy yesterday -Hemoglobin on presentation was 5.9, getting PRBC transfusion, on octreotide drip, on PPI -GI consulted, plan for colonoscopy tomorrow  Alcohol cirrhosis, with recurrent hepatic encephalopathy in the past due to medication noncompliance, recurrent SBP on Levaquin for SBP prophylaxis, portal hypertensive gastropathy, esophageal varices, ascites (requires paracentesis q2wks per record), splenomegaly ,thrombocytopenia Continue home medication Lasix/spironolactone, rifaximin, lactulose, Levaquin Currently on octreotide drip GI following  Chronic hyponatremia In the setting of cirrhosis, volume overload Sodium appears at baseline No  confusion Denies headache Monitor  Insulin-dependent type 2 diabetes His home insulin regime is written as a as needed basis with his Toujeo at 120 units as needed for high blood sugars and his sliding scale at 10 to 50 units as needed before meals He will be n.p.o. after midnight for colonoscopy, currently on clear liquids, long-acting insulin held, continue SSI  Chronic volume overload, chronic hypoxic respiratory failure on home O2 2 L -Appears at baseline, continue oxygen supplement -On Lasix and spironolactone -Getting dialysis  HTN:  BP low normal, hold Cardizem, change Toprol-XL to Lopressor with holding parameters for easy titration  ESRD on dialysis Nephrology following  Chronic foley for 2 yrs Changed q month, last changed a week ago   Nutritional Assessment: The patient's BMI is: Body mass index is 33.59 kg/m..  .    Unresulted Labs (From admission, onward)     Start     Ordered   01/19/21 0500  CBC  Tomorrow morning,   R        01/18/21 1613   01/19/21 0938  Basic metabolic panel  Tomorrow morning,   R        01/18/21 1613   01/19/21 0500  Magnesium  Tomorrow morning,   R        01/18/21 1613              DVT prophylaxis: SCDs Start: 01/17/21 1833   Code Status: Full Family Communication: Patient Disposition:   Status is: Inpatient  Dispo: The patient is from: Home              Anticipated d/c is to: Home  Anticipated d/c date is: To be determined              Patient currently is not medically stable to discharge  Consultants:  GI Nephrology  Procedures:  PRBC transfusion Hemodialysis Colonoscopy planned on 8/21  Antimicrobials:   Anti-infectives (From admission, onward)    Start     Dose/Rate Route Frequency Ordered Stop   01/20/21 2000  levofloxacin (LEVAQUIN) tablet 500 mg       Note to Pharmacy: OP VVO:HYWV 500 mg 3 times a week after dialysis. Patient taking differently: Take one tablet (500 mg) by mouth on  Monday, Wednesday, Friday after dialysis     500 mg Oral Every M-W-F (2000) 01/18/21 0359     01/18/21 1000  rifaximin (XIFAXAN) tablet 550 mg        550 mg Oral 2 times daily 01/18/21 0359             Objective: Vitals:   01/18/21 1145 01/18/21 1200 01/18/21 1224 01/18/21 1245  BP: (!) 139/59 (!) 124/58 (!) 124/58 (!) 132/57  Pulse: 88 88 95 93  Resp: 18 14 16 15   Temp:    98.8 F (37.1 C)  TempSrc:      SpO2:   100% 100%  Weight:      Height:        Intake/Output Summary (Last 24 hours) at 01/18/2021 1613 Last data filed at 01/18/2021 1224 Gross per 24 hour  Intake 1946.51 ml  Output 2843 ml  Net -896.49 ml   Filed Weights   01/17/21 2016 01/18/21 0415 01/18/21 0911  Weight: 93.6 kg 93.6 kg 94.4 kg    Examination:  General exam: Pale, weak, chronic ill-appearing ,calm, NAD Respiratory system: Diminished at bases , no wheezing , no rales, no rhonchi, respiratory effort normal. Cardiovascular system: S1 & S2 heard, RRR.  No pedal edema. Gastrointestinal system: Abdomen is nondistended, soft and nontender. Normal bowel sounds heard. Central nervous system: Alert and oriented.x3 No focal neurological deficits. Extremities: No edema Skin: No rashes, lesions or ulcers Psychiatry: Judgement and insight appear normal. Mood & affect appropriate.     Data Reviewed: I have personally reviewed following labs and imaging studies  CBC: Recent Labs  Lab 01/13/21 0830 01/16/21 0926 01/17/21 1147 01/17/21 1227 01/18/21 0247 01/18/21 1015 01/18/21 1444  WBC 3.6*  --  5.8  --  5.3 4.6  --   NEUTROABS 2.7  --  4.9  --   --   --   --   HGB 9.1*   < > 5.9* 6.5* 5.9* 6.8* 8.2*  HCT 29.3*   < > 18.5* 19.0* 17.9* 20.3* 24.7*  MCV 103.9*  --  104.5*  --  96.2 96.7  --   PLT 96*  --  106*  --  115* 79*  --    < > = values in this interval not displayed.    Basic Metabolic Panel: Recent Labs  Lab 01/16/21 0926 01/17/21 1147 01/17/21 1227 01/18/21 0247  NA 126* 127*  129* 125*  130*  K 4.9 4.4 4.3 4.3  4.4  CL 93* 94* 92* 94*  97*  CO2  --  22  --  22  22  GLUCOSE 136* 221* 218* 167*  167*  BUN 21 26* 23 27*  27*  CREATININE 2.80* 3.68* 3.70* 4.18*  4.09*  CALCIUM  --  8.0*  --  7.7*  7.9*  MG  --   --   --  1.5*  PHOS  --   --   --  6.5*    GFR: Estimated Creatinine Clearance: 16.9 mL/min (A) (by C-G formula based on SCr of 4.18 mg/dL (H)).  Liver Function Tests: Recent Labs  Lab 01/17/21 1147 01/18/21 0247  AST 20  --   ALT 15  --   ALKPHOS 146*  --   BILITOT 1.0  --   PROT 5.3*  --   ALBUMIN 2.3* 2.0*    CBG: Recent Labs  Lab 01/17/21 2125 01/18/21 0000 01/18/21 0438 01/18/21 0556 01/18/21 0718  GLUCAP 133* 156* 175* 158* 166*     Recent Results (from the past 240 hour(s))  SARS CORONAVIRUS 2 (TAT 6-24 HRS) Nasopharyngeal Nasopharyngeal Swab     Status: None   Collection Time: 01/17/21  7:53 PM   Specimen: Nasopharyngeal Swab  Result Value Ref Range Status   SARS Coronavirus 2 NEGATIVE NEGATIVE Final    Comment: (NOTE) SARS-CoV-2 target nucleic acids are NOT DETECTED.  The SARS-CoV-2 RNA is generally detectable in upper and lower respiratory specimens during the acute phase of infection. Negative results do not preclude SARS-CoV-2 infection, do not rule out co-infections with other pathogens, and should not be used as the sole basis for treatment or other patient management decisions. Negative results must be combined with clinical observations, patient history, and epidemiological information. The expected result is Negative.  Fact Sheet for Patients: SugarRoll.be  Fact Sheet for Healthcare Providers: https://www.woods-mathews.com/  This test is not yet approved or cleared by the [place] FDA and  has been authorized for detection and/or diagnosis of SARS-CoV-2 by FDA under an Emergency Use Authorization (EUA). This EUA will remain  in effect (meaning this  test can be used) for the duration of the COVID-19 declaration under Se ction 564(b)(1) of the Act, 21 U.S.C. section 360bbb-3(b)(1), unless the authorization is terminated or revoked sooner.  Performed at [facility], [place] [address] [place], [place] [address]          Radiology Studies: No results found.      Scheduled Meds:  [START ON 01/20/2021] ALPRAZolam  0.5 mg Oral Q M,W,F-HD   Chlorhexidine Gluconate Cloth  6 each Topical Q0600   furosemide  80 mg Oral Daily   gabapentin  100 mg Oral Q supper   hydrOXYzine  25 mg Oral TID   insulin aspart  0-5 Units Subcutaneous QHS   insulin aspart  0-6 Units Subcutaneous TID WC   [START ON 01/19/2021] lactulose  30 g Oral Once per day on Sun Tue Thu Sat   lactulose  30 g Oral QHS   [START ON 01/20/2021] levofloxacin  500 mg Oral Q M,W,F-2000   midodrine  10 mg Oral BID WC   mirtazapine  30 mg Oral Q supper   omega-3 acid ethyl esters  2 g Oral BID   pantoprazole  80 mg Oral Daily   peg 3350 powder  0.5 kit Oral Once   And   peg 3350 powder  0.5 kit Oral Once   pravastatin  20 mg Oral q AM   rifaximin  550 mg Oral BID   sodium chloride flush  3 mL Intravenous Q12H   spironolactone  50 mg Oral q AM   thiamine  100 mg Oral q AM   venlafaxine  75 mg Oral TID WC   Continuous Infusions:  sodium chloride     sodium chloride     sodium chloride     sodium chloride  octreotide  (SANDOSTATIN)    IV infusion 50 mcg/hr (01/18/21 1533)     LOS: 1 day   Time spent: 35 mins Greater than 50% of this time was spent in counseling, explanation of diagnosis, planning of further management, and coordination of care.   Voice Recognition Viviann Spare dictation system was used to create this note, attempts have been made to correct errors. Please contact the author with questions and/or clarifications.   Florencia Reasons, MD PhD FACP [facility]  Available via Epic secure chat 7am-7pm for nonurgent issues Please page for urgent  issues To page the attending provider between 7A-7P or the covering provider during after hours 7P-7A, please log into the web site [email or web address] and access using universal [place] password for that web site. If you do not have the password, please call the hospital operator.    01/18/2021, 4:13 PM

## 2021-01-18 NOTE — Progress Notes (Signed)
0730 Received pt alert and oriented, blood transfusion infusing. Gave report to HD RN, per RN he will give the next unit of  PRBC in HD. 0830 blood transfusion done. MD at bedside. 0900 Transported to HD.

## 2021-01-19 ENCOUNTER — Inpatient Hospital Stay (HOSPITAL_COMMUNITY): Payer: Medicare Other | Admitting: Certified Registered Nurse Anesthetist

## 2021-01-19 ENCOUNTER — Encounter (HOSPITAL_COMMUNITY)
Admission: EM | Disposition: A | Discharge status: Home Health | Payer: Self-pay | Source: Home / Self Care | Attending: Internal Medicine

## 2021-01-19 DIAGNOSIS — D123 Benign neoplasm of transverse colon: Secondary | ICD-10-CM

## 2021-01-19 DIAGNOSIS — K922 Gastrointestinal hemorrhage, unspecified: Secondary | ICD-10-CM | POA: Diagnosis not present

## 2021-01-19 DIAGNOSIS — D62 Acute posthemorrhagic anemia: Secondary | ICD-10-CM | POA: Diagnosis not present

## 2021-01-19 DIAGNOSIS — K7031 Alcoholic cirrhosis of liver with ascites: Secondary | ICD-10-CM | POA: Diagnosis not present

## 2021-01-19 DIAGNOSIS — I5032 Chronic diastolic (congestive) heart failure: Secondary | ICD-10-CM | POA: Diagnosis not present

## 2021-01-19 HISTORY — PX: POLYPECTOMY: SHX5525

## 2021-01-19 HISTORY — PX: HEMOSTASIS CLIP PLACEMENT: SHX6857

## 2021-01-19 HISTORY — PX: COLONOSCOPY WITH PROPOFOL: SHX5780

## 2021-01-19 LAB — GLUCOSE, CAPILLARY
Glucose-Capillary: 190 mg/dL — ABNORMAL HIGH (ref 70–99)
Glucose-Capillary: 186 mg/dL — ABNORMAL HIGH (ref 70–99)
Glucose-Capillary: 203 mg/dL — ABNORMAL HIGH (ref 70–99)
Glucose-Capillary: 164 mg/dL — ABNORMAL HIGH (ref 70–99)
Glucose-Capillary: 140 mg/dL — ABNORMAL HIGH (ref 70–99)
Glucose-Capillary: 170 mg/dL — ABNORMAL HIGH (ref 70–99)
Glucose-Capillary: 175 mg/dL — ABNORMAL HIGH (ref 70–99)
Glucose-Capillary: 163 mg/dL — ABNORMAL HIGH (ref 70–99)

## 2021-01-19 LAB — BASIC METABOLIC PANEL
Anion gap: 11 (ref 5–15)
BUN: 16 mg/dL (ref 8–23)
CO2: 22 mmol/L (ref 22–32)
Calcium: 7.8 mg/dL — ABNORMAL LOW (ref 8.9–10.3)
Chloride: 101 mmol/L (ref 98–111)
Creatinine, Ser: 2.98 mg/dL — ABNORMAL HIGH (ref 0.61–1.24)
GFR, Estimated: 21 mL/min — ABNORMAL LOW (ref >60)
Glucose, Bld: 203 mg/dL — ABNORMAL HIGH (ref 70–99)
Potassium: 3.8 mmol/L (ref 3.5–5.1)
Sodium: 134 mmol/L — ABNORMAL LOW (ref 135–145)

## 2021-01-19 LAB — CBC
HCT: 17.9 % — ABNORMAL LOW (ref 39.0–52.0)
HCT: 21 % — ABNORMAL LOW (ref 39.0–52.0)
Hemoglobin: 5.9 g/dL — CL (ref 13.0–17.0)
Hemoglobin: 7.3 g/dL — ABNORMAL LOW (ref 13.0–17.0)
MCH: 31.7 pg (ref 26.0–34.0)
MCH: 32 pg (ref 26.0–34.0)
MCHC: 33 g/dL (ref 30.0–36.0)
MCHC: 34.8 g/dL (ref 30.0–36.0)
MCV: 96.2 fL (ref 80.0–100.0)
MCV: 92.1 fL (ref 80.0–100.0)
Platelets: 115 10*3/uL — ABNORMAL LOW (ref 150–400)
Platelets: 72 10*3/uL — ABNORMAL LOW (ref 150–400)
RBC: 1.86 MIL/uL — ABNORMAL LOW (ref 4.22–5.81)
RBC: 2.28 MIL/uL — ABNORMAL LOW (ref 4.22–5.81)
RDW: 18.5 % — ABNORMAL HIGH (ref 11.5–15.5)
RDW: 16.7 % — ABNORMAL HIGH (ref 11.5–15.5)
WBC: 5.3 10*3/uL (ref 4.0–10.5)
WBC: 4.1 10*3/uL (ref 4.0–10.5)
nRBC: 0 % (ref 0.0–0.2)
nRBC: 0 % (ref 0.0–0.2)

## 2021-01-19 LAB — HEMOGLOBIN AND HEMATOCRIT, BLOOD
HCT: 20.2 % — ABNORMAL LOW (ref 39.0–52.0)
HCT: 29.3 % — ABNORMAL LOW (ref 39.0–52.0)
Hemoglobin: 6.6 g/dL — CL (ref 13.0–17.0)
Hemoglobin: 9.8 g/dL — ABNORMAL LOW (ref 13.0–17.0)

## 2021-01-19 LAB — MAGNESIUM: Magnesium: 1.6 mg/dL — ABNORMAL LOW (ref 1.7–2.4)

## 2021-01-19 LAB — PREPARE RBC (CROSSMATCH)

## 2021-01-19 SURGERY — COLONOSCOPY WITH PROPOFOL
Anesthesia: Monitor Anesthesia Care

## 2021-01-19 MED ORDER — SODIUM CHLORIDE 0.9% IV SOLUTION: Freq: Once | INTRAVENOUS | Status: DC

## 2021-01-19 MED ORDER — LACTULOSE 10 GM/15ML PO SOLN
30.0000 g | ORAL | Status: DC
  Administered 2021-01-19: 30 g via ORAL
  Filled 2021-01-19: qty 45

## 2021-01-19 MED ORDER — SODIUM CHLORIDE 0.9 % IV SOLN: INTRAVENOUS | Status: DC

## 2021-01-19 MED ORDER — DILTIAZEM HCL ER COATED BEADS 120 MG PO CP24
120.0000 mg | ORAL_CAPSULE | Freq: Every day | ORAL | Status: DC
  Administered 2021-01-19 – 2021-01-20 (×2): 120 mg via ORAL
  Filled 2021-01-19 (×2): qty 1

## 2021-01-19 MED ORDER — METOPROLOL SUCCINATE ER 25 MG PO TB24
25.0000 mg | ORAL_TABLET | Freq: Every day | ORAL | Status: DC
  Administered 2021-01-19: 25 mg via ORAL
  Filled 2021-01-19: qty 1

## 2021-01-19 MED ORDER — INSULIN GLARGINE-YFGN 100 UNIT/ML ~~LOC~~ SOLN
5.0000 [IU] | Freq: Every day | SUBCUTANEOUS | Status: DC
  Administered 2021-01-19: 5 [IU] via SUBCUTANEOUS
  Filled 2021-01-19 (×2): qty 0.05

## 2021-01-19 MED ORDER — PROPOFOL 500 MG/50ML IV EMUL
INTRAVENOUS | Status: DC | PRN
  Administered 2021-01-19: 100 ug/kg/min via INTRAVENOUS

## 2021-01-19 MED ORDER — INSULIN ASPART 100 UNIT/ML IJ SOLN
2.0000 [IU] | Freq: Three times a day (TID) | INTRAMUSCULAR | Status: DC
  Administered 2021-01-19 – 2021-01-20 (×2): 2 [IU] via SUBCUTANEOUS

## 2021-01-19 MED ORDER — PROPOFOL 10 MG/ML IV BOLUS
INTRAVENOUS | Status: DC | PRN
  Administered 2021-01-19: 20 mg via INTRAVENOUS

## 2021-01-19 NOTE — Transfer of Care (Signed)
Immediate Anesthesia Transfer of Care Note  Patient: Jeffrey [name]  Procedure(s) Performed: COLONOSCOPY WITH PROPOFOL POLYPECTOMY HOT HEMOSTASIS (ARGON PLASMA COAGULATION/BICAP)  Patient Location: PACU  Anesthesia Type:MAC  Level of Consciousness: sedated  Airway & Oxygen Therapy: Patient Spontanous Breathing and Patient connected to nasal cannula oxygen  Post-op Assessment: Report given to RN and Post -op Vital signs reviewed and stable  Post vital signs: Reviewed and stable  Last Vitals:  Vitals Value Taken Time  BP 154/53 01/19/21 1059  Temp    Pulse 59 01/19/21 1058  Resp 19 01/19/21 1100  SpO2 87 % 01/19/21 1058  Vitals shown include unvalidated device data.  Last Pain:  Vitals:   01/19/21 0914  TempSrc: Oral  PainSc: 0-No pain         Complications: No notable events documented.

## 2021-01-19 NOTE — Op Note (Signed)
[facility] Patient Name: Jeffrey [name] Procedure Date : 01/19/2021 MRN: [id number] Attending MD: Gladstone Pih. Candis Schatz , MD Date of Birth: 06/04/1945 CSN: [id number] Age: 75 Admit Type: Inpatient Procedure:                Colonoscopy Indications:              Hematochezia; pt underwent EGD/colo Aug 18th.                            Varices with stigmata were banded. Colonoscopy with                            large friable splenic flexure mass, biopsied, bled                            excessively following biopsy. Biopsies showed                            benign inflammatory changes. Large rectal varices;                            several polyps, not removed Providers:                Nicki Reaper E. Candis Schatz, MD, Grace Isaac, RN, [place], Clearnce Sorrel, CRNA Referring MD:              Medicines:                Monitored Anesthesia Care Complications:            No immediate complications. Estimated Blood Loss:     Estimated blood loss was minimal. Procedure:                Pre-Anesthesia Assessment:                           - Prior to the procedure, a History and Physical                            was performed, and patient medications and                            allergies were reviewed. The patient's tolerance of                            previous anesthesia was also reviewed. The risks                            and benefits of the procedure and the sedation                            options and risks were discussed with the patient.  All questions were answered, and informed consent                            was obtained. Prior Anticoagulants: The patient has                            taken no previous anticoagulant or antiplatelet                            agents. ASA Grade Assessment: IV - A patient with                            severe systemic disease that is a constant threat                            to  life. After reviewing the risks and benefits,                            the patient was deemed in satisfactory condition to                            undergo the procedure.                           After obtaining informed consent, the colonoscope                            was passed under direct vision. Throughout the                            procedure, the patient's blood pressure, pulse, and                            oxygen saturations were monitored continuously. The                            CF-HQ190L ([id number]) Olympus coloscope was                            introduced through the anus and advanced to the the                            terminal ileum, with identification of the                            appendiceal orifice and IC valve. The colonoscopy                            was performed with moderate difficulty due to poor                            endoscopic visualization, restricted mobility of  the colon and a tortuous colon. Successful                            completion of the procedure was aided by using                            manual pressure and withdrawing and reinserting the                            scope. The patient tolerated the procedure well.                            The quality of the bowel preparation was good. Scope In: 9:45:25 AM Scope Out: 10:47:10 AM Scope Withdrawal Time: 0 hours 45 minutes 3 seconds  Total Procedure Duration: 1 hour 1 minute 45 seconds  Findings:      The perianal and digital rectal examinations were normal. Pertinent       negatives include normal sphincter tone and no palpable rectal lesions.      A 15 mm polyp was found in the proximal descending colon. The polyp was       sessile. The polyp was removed with a piecemeal technique using a hot       snare. Resection and retrieval were complete. To prevent bleeding after       the polypectomy, two hemostatic clips were successfully placed (MR        conditional). There was no bleeding at the end of the procedure.      A 6 mm polyp was found in the ascending colon. The polyp was       semi-pedunculated.      A 25-30 mm elongated polypoid lesion was found at the splenic flexure.       The lesion was frond-like and friable. No bleeding was present, but       there was an adherent clot.      A 12 mm polyp was found in the sigmoid colon. The polyp was sessile.      Multiple small and large-mouthed diverticula were found in the sigmoid       colon and descending colon.      The terminal ileum appeared normal.      The exam was otherwise normal throughout the examined colon.      Large, non-bleeding rectal varices were found.      No additional abnormalities were found on retroflexion. Impression:               - One 15 mm polyp in the proximal descending colon,                            removed piecemeal using a hot snare. Polypectomy                            technically difficult given its location just                            proximal to a fixed angulation in the colon, as  well as poor visualization from poor insufllation                            and excessive peristalsis. Resected and retrieved.                            Clips (MR conditional) were placed.                           - One 6 mm polyp in the ascending colon.                           - Benign polypoid lesion at the splenic flexure,                            likely inflammatory polyp. This was felt to be the                            source of bleeding given the adherent clot and the                            reported friability and excessive bleeding                            following biopsy reported on previous colonoscopy.                            Biopsies showed inflammatory changes only, no                            dysplasia                           - 12 mm polyp in the sigmoid colon.                           -  Diverticulosis in the sigmoid colon and in the                            descending colon.                           - The examined portion of the ileum was normal.                           - Rectal varices.                           - The other polyps were not removed because of                            suspected lower risk of malignant transformation                            and a relatively  high risk of bleeding which was                            felt to outweigh benefits of polypectomy                           - Due to the long procedure time and confident                            bleeding source in the lower GI tract, decision was                            made not to proceed with planned upper endoscopy                            (pt had no hematemesis/melena, BUN not                            significantly elevated, no upper GI symptoms)                           - Of note, we received notification during the                            procedure that the patient's hemoglobin this                            morning was 6.6 (down from 7.3 previously). The                            staff anesthesiologist transfusion of 2 uPRBCs                            intraoperatively. The first unit was hung just                            prior to completion of the colonoscopy. Recommendation:           - Return patient to hospital ward for observation.                           - Low sodium/renal diet.                           - Continue present medications.                           - Await pathology results.                           - Repeat colonoscopy in 6-9 months for surveillance                            of piecemeal polypectomy, based on patient's health  status at that time.                           - Ok to discontinue octreotide gtt                           - PPI PO once daily                           - Continue diuretics and antibiotics                            - Check CBC q12hr Procedure Code(s):        --- Professional ---                           [phone], Colonoscopy, flexible; with removal of                            tumor(s), polyp(s), or other lesion(s) by snare                            technique Diagnosis Code(s):        --- Professional ---                           K63.5, Polyp of colon                           D12.3, Benign neoplasm of transverse colon (hepatic                            flexure or splenic flexure)                           K63.89, Other specified diseases of intestine                           K64.8, Other hemorrhoids                           K92.1, Melena (includes Hematochezia)                           K57.30, Diverticulosis of large intestine without                            perforation or abscess without bleeding CPT copyright 2019 American Medical Association. All rights reserved. The codes documented in this report are preliminary and upon coder review may  be revised to meet current compliance requirements. Sanjiv Castorena E. Candis Schatz, MD 01/19/2021 11:30:57 AM This report has been signed electronically. Number of Addenda: 0

## 2021-01-19 NOTE — Interval H&P Note (Signed)
History and Physical Interval Note:  Patient continued to have blood stools with clots until late afternoon/evening.  Bleeding cleared with bowel prep.  HD stable.  Hgb 7.3 after receiving 4 units (5.9 on presentation).  Plan to repeat EGD and colonoscopy to identify the bleeding culprit and provide hemostasis if necessary  01/19/2021 9:23 AM  Jeffrey [name]  has presented today for surgery, with the diagnosis of Hematochezia following polypectomy.  Known rectal varices and portal hyper tensive colopathy..  The various methods of treatment have been discussed with the patient and family. After consideration of risks, benefits and other options for treatment, the patient has consented to  Procedure(s): COLONOSCOPY WITH PROPOFOL (N/A) ESOPHAGOGASTRODUODENOSCOPY (EGD) (N/A) as a surgical intervention.  The patient's history has been reviewed, patient examined, no change in status, stable for surgery.  I have reviewed the patient's chart and labs.  Questions were answered to the patient's satisfaction.     Jeffrey [name]

## 2021-01-19 NOTE — Progress Notes (Signed)
Pt had chicken broth color BM this morning.

## 2021-01-19 NOTE — Progress Notes (Signed)
PROGRESS NOTE    Jeffrey [name]  ZHY:[id number] DOB: [birth year] DOA: 01/17/2021 PCP: Monico Blitz, MD    Chief Complaint  Patient presents with   Rectal Bleeding    Brief Narrative:  Jeffrey [name] is a 75 y.o. 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 Gala Romney. He went home and had 4-5 episodes of bright red blood per rectum and he started to feel weak so he went to [facility] and was found to have a hemoglobin of 5.9. He was given 1 unit of blood and transferred him to [facility]  Subjective:  Seen  in pm after returning from colonoscopy   He is sitting in chair, he is happy that he can finally eat a full meal  Denies pain He is on room air at rest  Assessment & Plan:   Principal Problem:   GI bleed Active Problems:   Alcoholic cirrhosis of liver with ascites ([place])   Esophageal varices in alcoholic cirrhosis ([place])   Essential hypertension, benign   Mixed hyperlipidemia   Type 2 diabetes mellitus ([place])   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[place])   Acute blood loss anemia   Chronic diastolic HF (heart failure) (HCC)   Hyponatremia   Thrombocytopenia (H[facility])   Rectal varices  Acute blood loss anemia -Report hematochezia following colonoscopy with polypectomy on 8/18 prior to hospitalization -Hemoglobin on presentation was 5.9, received 4units prbc so far He received octreotide drip since admission, this is discontinued on 8/21 per GI recommendation  -Status post colonoscopy on 8/21 , "Benign polypoid lesion at the splenic flexure,                            likely inflammatory polyp. 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 due to medication noncompliance, recurrent SBP on Levaquin for SBP prophylaxis, portal hypertensive gastropathy, esophageal varices, ascites (requires paracentesis q2wks per record), splenomegaly ,thrombocytopenia Continue home medication Lasix/spironolactone, rifaximin, lactulose, Levaquin octreotide drip started on admission, discontinued on 8/21 There is no significant edema or ascites , no confusion or flapping tremor on 8/21 exam GI following  Chronic hyponatremia In the setting of cirrhosis, volume overload Sodium appears at baseline No confusion Denies headache Monitor   Chronic volume overload, chronic hypoxic respiratory failure on home O2 2 L -Appears at baseline, continue oxygen supplement -On Lasix and spironolactone -Getting dialysisI  nsulin-dependent type 2 diabetes His home insulin regime is written as a as needed basis with his Toujeo at 120 units as needed for high blood sugars and his sliding scale at 10 to 50 units as needed before  meals -continue ssi, start  low dose 2unit novolog meal coverage, gradually start on long acting insulin now he started to eat more  HTN:  BP low normal on presentation Blood pressure start to go up, resume cardizem and metoprolol on 8/21   ESRD on dialysis MWF, next HD on 8/22 Nephrology following  Chronic foley for 2 yrs Changed q month, last changed a week ago  FTT, start PT eval, likely will need home health   Nutritional Assessment: The patient's BMI is: Body mass index is 32.81 kg/m..  .    Unresulted Labs (From admission, onward)     Start     Ordered   01/19/21 2204  Hemoglobin and hematocrit, blood  Now then every 12 hours,   R (with TIMED occurrences)      01/19/21 1210              DVT prophylaxis: SCDs Start: 01/17/21  1833   Code Status: Full Family Communication: Patient Disposition:   Status is: Inpatient  Dispo: The patient is from: Home              Anticipated d/c is to: Home              Anticipated d/c date is: 24-48hr , depend on hgb stability , needs gi and nephrology clearance                 Consultants:  GI Nephrology  Procedures:  PRBC transfusionx4 Hemodialysis Colonoscopy on 8/21  Antimicrobials:   Anti-infectives (From admission, onward)    Start     Dose/Rate Route Frequency Ordered Stop   01/20/21 2000  levofloxacin (LEVAQUIN) tablet 500 mg       Note to Pharmacy: OP UXN:ATFT 500 mg 3 times a week after dialysis. Patient taking differently: Take one tablet (500 mg) by mouth on Monday, Wednesday, Friday after dialysis     500 mg Oral Every M-W-F (2000) 01/18/21 0359     01/18/21 1000  rifaximin (XIFAXAN) tablet 550 mg        550 mg Oral 2 times daily 01/18/21 0359             Objective: Vitals:   01/19/21 1100 01/19/21 1115 01/19/21 1130 01/19/21 1145  BP: (!) 154/53 (!) 166/64 (!) 174/62 (!) 152/59  Pulse: (!) 59 83 81 81  Resp: 17 16 17 18   Temp:  97.6 F (36.4 C)  97.8 F (36.6 C)  TempSrc:    Oral  SpO2: (!) 87% 100% 94% 91%  Weight:      Height:        Intake/Output Summary (Last 24 hours) at 01/19/2021 1210 Last data filed at 01/19/2021 1200 Gross per 24 hour  Intake 1060 ml  Output 2743 ml  Net -1683 ml   Filed Weights   01/18/21 0415 01/18/21 0911 01/19/21 0404  Weight: 93.6 kg 94.4 kg 92.2 kg    Examination:  General exam: Pale, weak, chronic ill-appearing ,calm, NAD Respiratory system: Diminished at bases , no wheezing , no rales, no rhonchi, respiratory effort normal. Cardiovascular system: S1 & S2 heard, RRR.  No pedal edema. Gastrointestinal system: Abdomen is nondistended, soft and nontender. Normal bowel sounds heard. Central nervous system: Alert and oriented.x3 No focal neurological deficits. Extremities: No edema Skin: No  rashes, lesions or ulcers Psychiatry: Judgement and insight appear normal. Mood & affect appropriate.     Data Reviewed: I have personally reviewed following labs and imaging studies  CBC: Recent  Labs  Lab 01/13/21 0830 01/16/21 0926 01/17/21 1147 01/17/21 1227 01/18/21 0247 01/18/21 1015 01/18/21 1444 01/18/21 1814 01/19/21 0434 01/19/21 0832  WBC 3.6*  --  5.8  --  5.3 4.6  --   --  4.1  --   NEUTROABS 2.7  --  4.9  --   --   --   --   --   --   --   HGB 9.1*   < > 5.9*   < > 5.9* 6.8* 8.2* 8.0* 7.3* 6.6*  HCT 29.3*   < > 18.5*   < > 17.9* 20.3* 24.7* 24.0* 21.0* 20.2*  MCV 103.9*  --  104.5*  --  96.2 96.7  --   --  92.1  --   PLT 96*  --  106*  --  115* 79*  --   --  72*  --    < > = values in this interval not displayed.    Basic Metabolic Panel: Recent Labs  Lab 01/16/21 0926 01/17/21 1147 01/17/21 1227 01/18/21 0247 01/19/21 0434  NA 126* 127* 129* 125*  130* 134*  K 4.9 4.4 4.3 4.3  4.4 3.8  CL 93* 94* 92* 94*  97* 101  CO2  --  22  --  22  22 22   GLUCOSE 136* 221* 218* 167*  167* 203*  BUN 21 26* 23 27*  27* 16  CREATININE 2.80* 3.68* 3.70* 4.18*  4.09* 2.98*  CALCIUM  --  8.0*  --  7.7*  7.9* 7.8*  MG  --   --   --  1.5* 1.6*  PHOS  --   --   --  6.5*  --     GFR: Estimated Creatinine Clearance: 23.5 mL/min (A) (by C-G formula based on SCr of 2.98 mg/dL (H)).  Liver Function Tests: Recent Labs  Lab 01/17/21 1147 01/18/21 0247  AST 20  --   ALT 15  --   ALKPHOS 146*  --   BILITOT 1.0  --   PROT 5.3*  --   ALBUMIN 2.3* 2.0*    CBG: Recent Labs  Lab 01/18/21 2256 01/19/21 0402 01/19/21 0616 01/19/21 0712 01/19/21 1100  GLUCAP 190* 186* 203* 164* 140*     Recent Results (from the past 240 hour(s))  SARS CORONAVIRUS 2 (TAT 6-24 HRS) Nasopharyngeal Nasopharyngeal Swab     Status: None   Collection Time: 01/17/21  7:53 PM   Specimen: Nasopharyngeal Swab  Result Value Ref Range Status   SARS Coronavirus 2 NEGATIVE NEGATIVE  Final    Comment: (NOTE) SARS-CoV-2 target nucleic acids are NOT DETECTED.  The SARS-CoV-2 RNA is generally detectable in upper and lower respiratory specimens during the acute phase of infection. Negative results do not preclude SARS-CoV-2 infection, do not rule out co-infections with other pathogens, and should not be used as the sole basis for treatment or other patient management decisions. Negative results must be combined with clinical observations, patient history, and epidemiological information. The expected result is Negative.  Fact Sheet for Patients: SugarRoll.be  Fact Sheet for Healthcare Providers: https://www.woods-mathews.com/  This test is not yet approved or cleared by the [place] FDA and  has been authorized for detection and/or diagnosis of SARS-CoV-2 by FDA under an Emergency Use Authorization (EUA). This EUA will remain  in effect (meaning this test can be used) for the duration of the COVID-19 declaration under Se ction 564(b)(1) of the Act, 21 U.S.C. section 360bbb-3(b)(1), unless the authorization is terminated  or revoked sooner.  Performed at [facility], [place] [address], [place], [place] [address]          Radiology Studies: No results found.      Scheduled Meds:  [START ON 01/20/2021] ALPRAZolam  0.5 mg Oral Q M,W,F-HD   Chlorhexidine Gluconate Cloth  6 each Topical Q0600   furosemide  80 mg Oral Daily   gabapentin  100 mg Oral Q supper   hydrOXYzine  25 mg Oral TID   insulin aspart  0-5 Units Subcutaneous QHS   insulin aspart  0-6 Units Subcutaneous TID WC   lactulose  30 g Oral Once per day on Sun Tue Thu Sat   lactulose  30 g Oral QHS   [START ON 01/20/2021] levofloxacin  500 mg Oral Q M,W,F-2000   metoprolol tartrate  12.5 mg Oral BID   midodrine  10 mg Oral BID WC   mirtazapine  30 mg Oral Q supper   omega-3 acid ethyl esters  2 g Oral BID   pantoprazole  80 mg Oral Daily    pravastatin  20 mg Oral q AM   rifaximin  550 mg Oral BID   sodium chloride flush  3 mL Intravenous Q12H   spironolactone  50 mg Oral q AM   thiamine  100 mg Oral q AM   venlafaxine  75 mg Oral TID WC   Continuous Infusions:  sodium chloride     sodium chloride     sodium chloride     sodium chloride       LOS: 2 days   Time spent: 35 mins Greater than 50% of this time was spent in counseling, explanation of diagnosis, planning of further management, and coordination of care.   Voice Recognition Viviann Spare dictation system was used to create this note, attempts have been made to correct errors. Please contact the author with questions and/or clarifications.   Florencia Reasons, MD PhD FACP [facility]  Available via Epic secure chat 7am-7pm for nonurgent issues Please page for urgent issues To page the attending provider between 7A-7P or the covering provider during after hours 7P-7A, please log into the web site [email or web address] and access using universal [place] password for that web site. If you do not have the password, please call the hospital operator.    01/19/2021, 12:10 PM

## 2021-01-19 NOTE — Anesthesia Preprocedure Evaluation (Addendum)
Anesthesia Evaluation  Patient identified by MRN, date of birth, ID band Patient awake    Reviewed: Allergy & Precautions, NPO status , Patient's Chart, lab work & pertinent test results  Airway Mallampati: II  TM Distance: >3 FB Neck ROM: Full    Dental  (+) Teeth Intact   Pulmonary shortness of breath (2L ATC) and Long-Term Oxygen Therapy, former smoker,    Pulmonary exam normal        Cardiovascular hypertension, Pt. on medications and Pt. on home beta blockers  Rhythm:Regular Rate:Normal     Neuro/Psych Anxiety Depression negative neurological ROS     GI/Hepatic PUD, GERD  Medicated,(+) Cirrhosis   Esophageal Varices and ascites  substance abuse  alcohol use,   Endo/Other  diabetes, Type 2  Renal/GU ESRF and DialysisRenal disease  negative genitourinary   Musculoskeletal  (+) Arthritis , Osteoarthritis,    Abdominal (+)  Abdomen: soft. Bowel sounds: normal.  Peds  Hematology  (+) anemia ,   Anesthesia Other Findings   Reproductive/Obstetrics                            Anesthesia Physical Anesthesia Plan  ASA: 4  Anesthesia Plan: MAC   Post-op Pain Management:    Induction: Intravenous  PONV Risk Score and Plan: 1 and Propofol infusion and Treatment may vary due to age or medical condition  Airway Management Planned: Simple Face Mask, Natural Airway and Nasal Cannula  Additional Equipment: None  Intra-op Plan:   Post-operative Plan:   Informed Consent: I have reviewed the patients History and Physical, chart, labs and discussed the procedure including the risks, benefits and alternatives for the proposed anesthesia with the patient or authorized representative who has indicated his/her understanding and acceptance.     Dental advisory given  Plan Discussed with: CRNA  Anesthesia Plan Comments: (Lab Results      Component                Value               Date                       WBC                      4.1                 01/19/2021                HGB                      7.3 (L)             01/19/2021                HCT                      21.0 (L)            01/19/2021                MCV                      92.1                01/19/2021                PLT  72 (L)              01/19/2021           Lab Results      Component                Value               Date                      NA                       134 (L)             01/19/2021                K                        3.8                 01/19/2021                CO2                      22                  01/19/2021                GLUCOSE                  203 (H)             01/19/2021                BUN                      16                  01/19/2021                CREATININE               2.98 (H)            01/19/2021                CALCIUM                  7.8 (L)             01/19/2021                GFRNONAA                 21 (L)              01/19/2021                GFRAA                    15 (L)              02/20/2020            ECHO 2021: 1. Left ventricular ejection fraction, by estimation, is 55 to 60%. The  left ventricle has normal function. The left ventricle has no regional  wall motion abnormalities. There is mild left ventricular hypertrophy.  Left ventricular diastolic parameters  are indeterminate.  2. Right ventricular systolic function is normal. The right ventricular  size  is normal.  3. Left atrial size was moderately dilated.  4. Right atrial size was moderately dilated.  5. The mitral valve is normal in structure. Trivial mitral valve  regurgitation. No evidence of mitral stenosis.  6. The aortic valve is tricuspid. Aortic valve regurgitation is not  visualized. No aortic stenosis is present.  7. The inferior vena cava is normal in size with greater than 50%  respiratory variability, suggesting right atrial  pressure of 3 mmHg. )        Anesthesia Quick Evaluation

## 2021-01-19 NOTE — Anesthesia Postprocedure Evaluation (Signed)
Anesthesia Post Note  Patient: Jeffrey [name]  Procedure(s) Performed: COLONOSCOPY WITH PROPOFOL POLYPECTOMY HOT HEMOSTASIS (ARGON PLASMA COAGULATION/BICAP)     Patient location during evaluation: PACU Anesthesia Type: MAC Level of consciousness: awake and alert Pain management: pain level controlled Vital Signs Assessment: post-procedure vital signs reviewed and stable Respiratory status: spontaneous breathing, nonlabored ventilation, respiratory function stable and patient connected to nasal cannula oxygen Cardiovascular status: stable and blood pressure returned to baseline Postop Assessment: no apparent nausea or vomiting Anesthetic complications: no   No notable events documented.  Last Vitals:  Vitals:   01/19/21 1504 01/19/21 1506  BP: 140/64 140/63  Pulse: 77   Resp:    Temp:    SpO2: (!) 87% 93%    Last Pain:  Vitals:   01/19/21 1145  TempSrc: Oral  PainSc:                  March Rummage Lashay Osborne

## 2021-01-19 NOTE — Progress Notes (Signed)
Pt refusing bed alarm. Educated on fall risk and safety precautions. Pt still refuses bed alarms.

## 2021-01-19 NOTE — Progress Notes (Signed)
[facility] Progress Note   Assessment/ Plan:    Jeffrey [name]- records [place]  Assessment/Plan: 1 GI bleed: in setting of recent EGD and c-scope Thursday.  4 u pRBCs given.  Had mass at splenic flexure biopsied-> ? Potential source (although he has many) GI consulting here.  Overall- he has severe esophageal and colonic varices, 50% 6 mo mortality. Endoscopy today. 2 ESRD: normally MWF- missed HD Friday, on HD off schedule 01/18/21  No heparin.  HD next 01/20/21 3 Hypertension: expect to improve with UF 4. Anemia of ESRD/ ABLA: got 4 u pRBCs, will get records for ESA 5. Metabolic Bone Disease: VDRA and binders when eating 6.  DM II: per primary 7.  Ascites: gets taps every 2 weeks- monitor here for need 8.  Dispo: admitted  Subjective:     In endoscopy this AM.  HD yesterday, 2.7L removed   Objective:   BP (!) 214/50   Pulse 83   Temp 98.6 F (37 C) (Oral)   Resp 13   Ht 5\' 6"  (1.676 m)   Wt 92.2 kg   SpO2 100%   BMI 32.81 kg/m   Physical Exam see exam 01/18/21: GEN: NAD, sitting in bed HEENT EOMI PERRL NECK no JVD PULM clear anteriorly CV RRR today ABD soft, + fluid wave EXT 1+ LE edema NEURO AAO x 3 nonfocal, no asterixis ACCESS L AVF + T/B SKIN: thinned with multiple bruises  Labs: BMET Recent Labs  Lab 01/16/21 0926 01/17/21 1147 01/17/21 1227 01/18/21 0247 01/19/21 0434  NA 126* 127* 129* 125*  130* 134*  K 4.9 4.4 4.3 4.3  4.4 3.8  CL 93* 94* 92* 94*  97* 101  CO2  --  22  --  22  22 22   GLUCOSE 136* 221* 218* 167*  167* 203*  BUN 21 26* 23 27*  27* 16  CREATININE 2.80* 3.68* 3.70* 4.18*  4.09* 2.98*  CALCIUM  --  8.0*  --  7.7*  7.9* 7.8*  PHOS  --   --   --  6.5*  --    CBC Recent Labs  Lab 01/13/21 0830 01/16/21 0926 01/17/21 1147 01/17/21 1227 01/18/21 0247 01/18/21 1015 01/18/21 1444 01/18/21 1814 01/19/21 0434  WBC 3.6*  --  5.8  --  5.3 4.6  --   --  4.1  NEUTROABS 2.7  --  4.9  --   --   --   --   --    --   HGB 9.1*   < > 5.9*   < > 5.9* 6.8* 8.2* 8.0* 7.3*  HCT 29.3*   < > 18.5*   < > 17.9* 20.3* 24.7* 24.0* 21.0*  MCV 103.9*  --  104.5*  --  96.2 96.7  --   --  92.1  PLT 96*  --  106*  --  115* 79*  --   --  72*   < > = values in this interval not displayed.      Medications:     [MAR Hold] ALPRAZolam  0.5 mg Oral Q M,W,F-HD   [MAR Hold] Chlorhexidine Gluconate Cloth  6 each Topical Q0600   [MAR Hold] furosemide  80 mg Oral Daily   [MAR Hold] gabapentin  100 mg Oral Q supper   [MAR Hold] hydrOXYzine  25 mg Oral TID   [MAR Hold] insulin aspart  0-5 Units Subcutaneous QHS   [MAR Hold] insulin aspart  0-6 Units Subcutaneous TID WC   [MAR  Hold] lactulose  30 g Oral Once per day on Sun Tue Thu Sat   [facility] Hold] lactulose  30 g Oral QHS   [MAR Hold] levofloxacin  500 mg Oral Q M,W,F-2000   [MAR Hold] metoprolol tartrate  12.5 mg Oral BID   [MAR Hold] midodrine  10 mg Oral BID WC   [MAR Hold] mirtazapine  30 mg Oral Q supper   [MAR Hold] omega-3 acid ethyl esters  2 g Oral BID   [MAR Hold] pantoprazole  80 mg Oral Daily   [MAR Hold] pravastatin  20 mg Oral q AM   [MAR Hold] rifaximin  550 mg Oral BID   [MAR Hold] sodium chloride flush  3 mL Intravenous Q12H   [MAR Hold] spironolactone  50 mg Oral q AM   [MAR Hold] thiamine  100 mg Oral q AM   [MAR Hold] venlafaxine  75 mg Oral TID WC     Madelon Lips, MD 01/19/2021, 10:18 AM

## 2021-01-19 NOTE — Plan of Care (Signed)
  Problem: Education: Goal: Knowledge of General Education information will improve Description: Including pain rating scale, medication(s)/side effects and non-pharmacologic comfort measures Outcome: Progressing   Problem: Clinical Measurements: Goal: Will remain free from infection Outcome: Progressing   

## 2021-01-20 ENCOUNTER — Encounter (HOSPITAL_COMMUNITY): Payer: Self-pay | Admitting: Gastroenterology

## 2021-01-20 DIAGNOSIS — K921 Melena: Secondary | ICD-10-CM | POA: Diagnosis not present

## 2021-01-20 LAB — BPAM RBC
Blood Product Expiration Date: 202209242359
Blood Product Expiration Date: 202209242359
Blood Product Expiration Date: 202209212359
Blood Product Expiration Date: 202209212359
Blood Product Expiration Date: 202209212359
Blood Product Expiration Date: 202209232359
Blood Product Expiration Date: 202209232359
ISSUE DATE / TIME: 202208191331
ISSUE DATE / TIME: 202208192036
ISSUE DATE / TIME: 202208200445
ISSUE DATE / TIME: 202208201025
ISSUE DATE / TIME: 202208211038
ISSUE DATE / TIME: 202208211038
Unit Type and Rh: 5100
Unit Type and Rh: 5100
Unit Type and Rh: 5100
Unit Type and Rh: 5100
Unit Type and Rh: 5100
Unit Type and Rh: 5100
Unit Type and Rh: 5100

## 2021-01-20 LAB — TYPE AND SCREEN
ABO/RH(D): O POS
ABO/RH(D): O POS
Antibody Screen: NEGATIVE
Antibody Screen: NEGATIVE
Unit division: 0
Unit division: 0
Unit division: 0
Unit division: 0
Unit division: 0
Unit division: 0
Unit division: 0

## 2021-01-20 LAB — GLUCOSE, CAPILLARY
Glucose-Capillary: 162 mg/dL — ABNORMAL HIGH (ref 70–99)
Glucose-Capillary: 171 mg/dL — ABNORMAL HIGH (ref 70–99)

## 2021-01-20 LAB — HEMOGLOBIN AND HEMATOCRIT, BLOOD
HCT: 30 % — ABNORMAL LOW (ref 39.0–52.0)
Hemoglobin: 9.8 g/dL — ABNORMAL LOW (ref 13.0–17.0)

## 2021-01-20 MED ORDER — PANTOPRAZOLE SODIUM 40 MG PO TBEC: 80.0000 mg | DELAYED_RELEASE_TABLET | Freq: Every day | ORAL | 0 refills | Status: DC

## 2021-01-20 NOTE — Discharge Summary (Signed)
Physician Discharge Summary  Jeffrey [name] NID:[id number] DOB: November 25, 1945 DOA: 01/17/2021  PCP: Monico Blitz, MD  Admit date: 01/17/2021 Discharge date: 01/20/2021  Admitted From: Home Disposition:  Home  Discharge Condition:Stable CODE STATUS:FULL Diet recommendation: Heart Healthy   Brief/Interim Summary: Patient  is a 75 y.o. 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 Gala Romney. He went home and had 4-5 episodes of bright red blood per rectum and he started to feel weak so he went to [facility] and was found to have a hemoglobin of 5.9. He was given 1 unit of blood and transferred him to [facility].  Gastroenterology consulted after admission.  He underwent colonoscopy on 01/19/2021 which showed benign polypoid lesion at the splenic flexure likely inflammatory polyp which could be the source of bleeding.  Hospital course was remarkable for anemia requiring blood transfusion.  Currently his hemoglobin is stable in the range of 9.  GI has cleared him for discharge.  We recommend him to follow-up with his PCP in a week to check hemoglobin and also follow-up with his gastroenterologist as soon as possible.  He is medically stable for discharge home today.  Following problems were addressed during his hospitalization:  Acute blood loss anemia -Report hematochezia following colonoscopy with polypectomy on 8/18 prior to hospitalization -EGD and colonoscopy on 8/18 had showed grade 2 esophageal varices, multilobulated rectal varices, portal colopathy, two 1 cm pedunculated polyps and 3X 4 cm polypoid lesion at the splenic flexure -Hemoglobin on presentation was 5.9, received 4units prbc so far He received octreotide drip since admission, this is discontinued on 8/21 per GI recommendation  -Status post colonoscopy on 8/21,showed benign polypoid lesion at the splenic flexure likely  inflammatory polyp which could be the source of bleeding. -Continue PPI -Hb stable in the range of 9 today -Follow-up recommended with his own gastroenterologist in 2 to 4 weeks.   Alcohol cirrhosis Has H/O  recurrent hepatic encephalopathy in the past due to medication noncompliance, recurrent SBP on Levaquin for SBP prophylaxis, portal hypertensive gastropathy, esophageal varices, ascites (requires paracentesis q2wks per record), splenomegaly ,thrombocytopenia Continue home medication Lasix/spironolactone, rifaximin, lactulose, Levaquin There is no significant edema or ascites , no confusion or flapping tremor currently   Chronic hyponatremia In the setting of cirrhosis, volume overload Sodium appears at baseline No confusion   Chronic volume overload, chronic hypoxic respiratory failure on home O2 2 L -Appears at baseline, continue oxygen supplement -On Lasix and spironolactone -Volume management as per dialysis   Insulin-dependent type 2 diabetes Continue home regimen   HTN:  BP low normal on presentation Blood pressure stable now,continue cardizem and metoprolol on 8/21     ESRD on dialysis MWF Nephrology following here He will be dialyzed before discharge today   Chronic foley for 2 yrs Changed q month, last changed a week ago   FTT/deconditioning PT recommended home health at discharge     Discharge Diagnoses:  Principal Problem:   GI bleed Active Problems:   Alcoholic cirrhosis of liver with ascites ([place])   Esophageal varices in alcoholic cirrhosis ([place])   Essential hypertension, benign   Mixed hyperlipidemia   Type 2 diabetes mellitus ([place])   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[place])   Acute blood loss anemia   Chronic diastolic HF (heart failure) ([facility])   Hyponatremia   Thrombocytopenia ([facility])   Rectal varices    Discharge Instructions  Discharge Instructions     Diet - low sodium heart healthy   Complete by: As directed    Discharge instructions    Complete by: As directed    1)Please follow-up with your PCP in a week.  Do a CBC just to check your hemoglobin during the follow-up 2)Follow up with your gastroenterologist as an outpatient in 2 weeks.   Increase activity slowly   Complete by: As directed    No wound care   Complete by: As directed       Allergies as of 01/20/2021       Reactions   Tape Itching, Rash, Other (See Comments)   Tears skin        Medication List     STOP taking these medications    omeprazole 40 MG capsule Commonly known as: PRILOSEC Replaced by: pantoprazole 40 MG tablet       TAKE these medications    acetaminophen 650 MG CR tablet Commonly known as: TYLENOL Take 650 mg by mouth every 8 (eight) hours as needed for pain.   Adjustable Lancing Device Misc Apply 1 each topically daily.   Alcohol Prep 70 % Pads Apply 1 each topically 3 (three) times daily.   Alka-Seltzer Heartburn + Gas 750-80 MG Chew Generic drug: Calcium Carbonate-Simethicone Chew 1-2 tablets by mouth 2 (two) times daily as needed (acid reflux).   ALPRAZolam 0.5 MG tablet Commonly known as: XANAX Take 0.5 mg by mouth See admin instructions. Take one tablet (0.5 mg) by mouth on Monday, Wednesday, Friday before dialysis, may also take one tablet (0.5 mg) daily as needed for anxiety.   calcium carbonate 500 MG chewable tablet Commonly known as: TUMS - dosed in mg elemental calcium Chew 1 tablet by mouth 3 (three) times daily with meals.   diltiazem 120 MG 24 hr capsule Commonly known as: TIAZAC Take 120 mg by mouth daily.   furosemide 40 MG tablet Commonly known as: LASIX Take 80 mg by mouth daily.   gabapentin 100 MG capsule Commonly known as: NEURONTIN TAKE 1 CAPSULE BY MOUTH AT BEDTIME What changed: when to take this   GlucaGen HypoKit 1 MG Solr injection Generic drug: glucagon Inject 1 mg into the muscle once as needed for low blood sugar.   HumaLOG KwikPen 100 UNIT/ML KwikPen Generic drug: insulin  lispro Inject 10-50 Units into the skin 3 (three) times daily with meals. Per sliding scale based on CBG   hydrocortisone 2.5 % rectal cream Commonly known as: Anusol-HC Place 1 application rectally 2 (two) times daily. What changed:  when to take this reasons to take this   hydrOXYzine 25 MG tablet Commonly known as: ATARAX/VISTARIL Take 25 mg by mouth 3 (three) times daily. For itching   iron polysaccharides 150 MG capsule Commonly known as: NIFEREX Take 150 mg by mouth daily with supper.   lactulose 10 GM/15ML solution Commonly known as: Constulose TAKE 45 MLS EVERY MORNING AND TAKE 45 MLS EVERY EVENING What changed:  how much to take how to take this when to take this additional instructions   levofloxacin 500 MG tablet Commonly known as: Levaquin Take 500 mg 3 times a week after dialysis. What changed:  how much to take how to take this when to take this additional instructions   metoprolol succinate 25 MG 24 hr tablet Commonly known as: TOPROL-XL Take 1 tablet (25 mg total) by mouth daily. Take at bedtime  on Monday ,Wednesday and Friday due to dialaylisis What changed:  when to take this additional instructions   midodrine 10 MG tablet Commonly known as: PROAMATINE Take 1 tablet (10 mg total) by mouth 2 (two) times daily with a meal.   milk thistle 175 MG tablet Take 175 mg by mouth 3 (three) times daily.   mirtazapine 30 MG tablet Commonly known as: REMERON Take 1 tablet (30 mg total) by mouth at bedtime. What changed: when to take this   multivitamin with minerals Tabs tablet Take 1 tablet by mouth in the morning.   NovoFine 32G X 6 MM Misc Generic drug: Insulin Pen Needle Inject into the skin.   nystatin powder Commonly known as: MYCOSTATIN/NYSTOP Apply topically 3 (three) times daily. Into groin area and affected area What changed:  how much to take when to take this reasons to take this   omega-3 acid ethyl esters 1 g capsule Commonly  known as: LOVAZA Take 2 g by mouth 2 (two) times daily.   ondansetron 4 MG tablet Commonly known as: ZOFRAN Take 4 mg by mouth every 8 (eight) hours as needed for nausea or vomiting.   oxyCODONE-acetaminophen 5-325 MG tablet Commonly known as: PERCOCET/ROXICET Take 1 tablet by mouth every 4 (four) hours as needed for moderate pain.   pantoprazole 40 MG tablet Commonly known as: PROTONIX Take 2 tablets (80 mg total) by mouth daily. Start taking on: January 21, 2021 Replaces: omeprazole 40 MG capsule   polyethylene glycol-electrolytes 420 g solution Commonly known as: TriLyte Take 4,000 mLs by mouth as directed.   pravastatin 20 MG tablet Commonly known as: PRAVACHOL Take 20 mg by mouth in the morning.   PROBIOTIC DAILY PO Take 1 tablet by mouth in the morning.   spironolactone 50 MG tablet Commonly known as: ALDACTONE Take 50 mg by mouth in the morning.   thiamine 100 MG tablet Commonly known as: Vitamin B-1 Take 100 mg by mouth in the morning.   tiZANidine 2 MG tablet Commonly known as: ZANAFLEX Take 2 mg by mouth 2 (two) times daily as needed for muscle spasms.   [name] Generic drug: insulin degludec Inject 120 Units into the skin at bedtime. When blood sugars are up   TRI-VIT/FLUORIDE/IRON PO Take 1 tablet by mouth daily.   triamcinolone cream 0.1 % Commonly known as: KENALOG Apply 1 application topically 2 (two) times daily as needed (dry skin Itching).   venlafaxine 75 MG tablet Commonly known as: EFFEXOR Take 75 mg by mouth 3 (three) times daily with meals.   vitamin C 1000 MG tablet Take 1,000 mg by mouth in the morning.   Xifaxan 550 MG Tabs tablet Generic drug: rifaximin TAKE 1 TABLET BY MOUTH TWICE DAILY What changed: how much to take        Follow-up Information     Monico Blitz, MD. Schedule an appointment as soon as possible for a visit in 1 week(s).   Specialty: Internal Medicine Contact  information: [place] [phone]                Allergies  Allergen Reactions   Tape Itching, Rash and Other (See Comments)    Tears skin    Consultations: Gastroenterology, nephrology   Procedures/Studies: US Paracentesis  Result Date: 01/03/2021 INDICATION: Ascites EXAM: ULTRASOUND GUIDED  PARACENTESIS MEDICATIONS: None COMPLICATIONS: None PROCEDURE: Informed written consent was obtained from the patient after a discussion of the risks, benefits and alternatives to treatment. A timeout was performed prior to the  initiation of the procedure. Initial ultrasound scanning demonstrates a moderate amount of ascites within the left lower abdominal quadrant. The left lower abdomen was prepped and draped in the usual sterile fashion. 1% lidocaine was used for local anesthesia. Following this, a 19 gauge, 10-cm, Yueh catheter was introduced. An ultrasound image was saved for documentation purposes. The paracentesis was performed. The catheter was removed and a dressing was applied. The patient tolerated the procedure well without immediate post procedural complication. FINDINGS: A total of approximately 3.4 L of cloudy, yellow fluid was removed. Samples were sent to the laboratory as requested by the clinical team. IMPRESSION: Successful ultrasound-guided paracentesis yielding 3.4 L liters of peritoneal fluid. Electronically Signed   By: Abigail Miyamoto M.D.   On: 01/03/2021 11:22   US Paracentesis  Result Date: 12/26/2020 INDICATION: Alcohol Cirrhosis ESRD Recurrent ascites EXAM: ULTRASOUND GUIDED LLQ PARACENTESIS MEDICATIONS: 10 cc 1% lidocaine COMPLICATIONS: None immediate. PROCEDURE: Informed written consent was obtained from the patient after a discussion of the risks, benefits and alternatives to treatment. A timeout was performed prior to the initiation of the procedure. Initial ultrasound scanning demonstrates a large amount of ascites within the left lower  abdominal quadrant. The left lower abdomen was prepped and draped in the usual sterile fashion. 1% lidocaine was used for local anesthesia. Following this, a Yueh catheter was introduced. An ultrasound image was saved for documentation purposes. The paracentesis was performed. The catheter was removed and a dressing was applied. The patient tolerated the procedure well without immediate post procedural complication. Patient received post-procedure intravenous albumin; see nursing notes for details. FINDINGS: A total of approximately 2.5 liters of cloudy yellow fluid was removed. Samples were sent to the laboratory as requested by the clinical team. IMPRESSION: Successful ultrasound-guided paracentesis yielding 2.5 liters of peritoneal fluid. Read by Lavonia Drafts [facility] Electronically Signed   By: Marcello Moores  Register   On: 12/26/2020 11:50      Subjective: Patient seen and examined the bedside this afternoon.  Hemodynamically stable for discharge today  Discharge Exam: Vitals:   01/20/21 0733 01/20/21 1236  BP: (!) 146/69 (!) 150/72  Pulse: 77 66  Resp: 17 20  Temp: 98.5 F (36.9 C) 98.3 F (36.8 C)  SpO2: 97% 100%   Vitals:   01/20/21 0027 01/20/21 0431 01/20/21 0733 01/20/21 1236  BP: (!) 156/72 (!) 147/64 (!) 146/69 (!) 150/72  Pulse: 85 81 77 66  Resp: 19 18 17 20   Temp: 98.6 F (37 C) 98.2 F (36.8 C) 98.5 F (36.9 C) 98.3 F (36.8 C)  TempSrc: Oral Oral Oral Oral  SpO2: 92% 99% 97% 100%  Weight:      Height:        General: Pt is alert, awake, not in acute distress Cardiovascular: RRR, S1/S2 +, no rubs, no gallops Respiratory: CTA bilaterally, no wheezing, no rhonchi Abdominal: Soft, ND, bowel sounds +, abdominal distention Extremities: no edema, no cyanosis, dialysis access on the left arm    The results of significant diagnostics from this hospitalization (including imaging, microbiology, ancillary and laboratory) are listed below for reference.      Microbiology: Recent Results (from the past 240 hour(s))  SARS CORONAVIRUS 2 (TAT 6-24 HRS) Nasopharyngeal Nasopharyngeal Swab     Status: None   Collection Time: 01/17/21  7:53 PM   Specimen: Nasopharyngeal Swab  Result Value Ref Range Status   SARS Coronavirus 2 NEGATIVE NEGATIVE Final    Comment: (NOTE) SARS-CoV-2 target nucleic acids are NOT DETECTED.  The SARS-CoV-2 RNA is generally detectable in upper and lower respiratory specimens during the acute phase of infection. Negative results do not preclude SARS-CoV-2 infection, do not rule out co-infections with other pathogens, and should not be used as the sole basis for treatment or other patient management decisions. Negative results must be combined with clinical observations, patient history, and epidemiological information. The expected result is Negative.  Fact Sheet for Patients: SugarRoll.be  Fact Sheet for Healthcare Providers: https://www.woods-mathews.com/  This test is not yet approved or cleared by the [place] FDA and  has been authorized for detection and/or diagnosis of SARS-CoV-2 by FDA under an Emergency Use Authorization (EUA). This EUA will remain  in effect (meaning this test can be used) for the duration of the COVID-19 declaration under Se ction 564(b)(1) of the Act, 21 U.S.C. section 360bbb-3(b)(1), unless the authorization is terminated or revoked sooner.  Performed at [facility], [place] [address], [place], [place] [address]      Labs: BNP (last 3 results) No results for input(s): BNP in the last 8760 hours. Basic Metabolic Panel: Recent Labs  Lab 01/16/21 0926 01/17/21 1147 01/17/21 1227 01/18/21 0247 01/19/21 0434  NA 126* 127* 129* 125*  130* 134*  K 4.9 4.4 4.3 4.3  4.4 3.8  CL 93* 94* 92* 94*  97* 101  CO2  --  22  --  22  22 22   GLUCOSE 136* 221* 218* 167*  167* 203*  BUN 21 26* 23 27*  27* 16  CREATININE 2.80* 3.68*  3.70* 4.18*  4.09* 2.98*  CALCIUM  --  8.0*  --  7.7*  7.9* 7.8*  MG  --   --   --  1.5* 1.6*  PHOS  --   --   --  6.5*  --    Liver Function Tests: Recent Labs  Lab 01/17/21 1147 01/18/21 0247  AST 20  --   ALT 15  --   ALKPHOS 146*  --   BILITOT 1.0  --   PROT 5.3*  --   ALBUMIN 2.3* 2.0*   No results for input(s): LIPASE, AMYLASE in the last 168 hours. No results for input(s): AMMONIA in the last 168 hours. CBC: Recent Labs  Lab 01/17/21 1147 01/17/21 1227 01/18/21 0247 01/18/21 1015 01/18/21 1444 01/18/21 1814 01/19/21 0434 01/19/21 0832 01/19/21 2130 01/20/21 1202  WBC 5.8  --  5.3 4.6  --   --  4.1  --   --   --   NEUTROABS 4.9  --   --   --   --   --   --   --   --   --   HGB 5.9*   < > 5.9* 6.8*   < > 8.0* 7.3* 6.6* 9.8* 9.8*  HCT 18.5*   < > 17.9* 20.3*   < > 24.0* 21.0* 20.2* 29.3* 30.0*  MCV 104.5*  --  96.2 96.7  --   --  92.1  --   --   --   PLT 106*  --  115* 79*  --   --  72*  --   --   --    < > = values in this interval not displayed.   Cardiac Enzymes: No results for input(s): CKTOTAL, CKMB, CKMBINDEX, TROPONINI in the last 168 hours. BNP: Invalid input(s): POCBNP CBG: Recent Labs  Lab 01/19/21 1557 01/19/21 2022 01/19/21 2112 01/20/21 0604 01/20/21 1111  GLUCAP 170* 175* 163* 162* 171*  D-Dimer No results for input(s): DDIMER in the last 72 hours. Hgb A1c Recent Labs    01/18/21 0247  HGBA1C 5.9*   Lipid Profile No results for input(s): CHOL, HDL, LDLCALC, TRIG, CHOLHDL, LDLDIRECT in the last 72 hours. Thyroid function studies No results for input(s): TSH, T4TOTAL, T3FREE, THYROIDAB in the last 72 hours.  Invalid input(s): FREET3 Anemia work up No results for input(s): VITAMINB12, FOLATE, FERRITIN, TIBC, IRON, RETICCTPCT in the last 72 hours. Urinalysis    Component Value Date/Time   COLORURINE YELLOW 07/09/2020 1223   APPEARANCEUR HAZY (A) 07/09/2020 1223   LABSPEC 1.005 07/09/2020 1223   PHURINE 7.0 07/09/2020 1223    GLUCOSEU 50 (A) 07/09/2020 1223   HGBUR SMALL (A) 07/09/2020 1223   BILIRUBINUR NEGATIVE 07/09/2020 1223   KETONESUR NEGATIVE 07/09/2020 1223   PROTEINUR 30 (A) 07/09/2020 1223   NITRITE NEGATIVE 07/09/2020 1223   LEUKOCYTESUR LARGE (A) 07/09/2020 1223   Sepsis Labs Invalid input(s): PROCALCITONIN,  WBC,  LACTICIDVEN Microbiology Recent Results (from the past 240 hour(s))  SARS CORONAVIRUS 2 (TAT 6-24 HRS) Nasopharyngeal Nasopharyngeal Swab     Status: None   Collection Time: 01/17/21  7:53 PM   Specimen: Nasopharyngeal Swab  Result Value Ref Range Status   SARS Coronavirus 2 NEGATIVE NEGATIVE Final    Comment: (NOTE) SARS-CoV-2 target nucleic acids are NOT DETECTED.  The SARS-CoV-2 RNA is generally detectable in upper and lower respiratory specimens during the acute phase of infection. Negative results do not preclude SARS-CoV-2 infection, do not rule out co-infections with other pathogens, and should not be used as the sole basis for treatment or other patient management decisions. Negative results must be combined with clinical observations, patient history, and epidemiological information. The expected result is Negative.  Fact Sheet for Patients: SugarRoll.be  Fact Sheet for Healthcare Providers: https://www.woods-mathews.com/  This test is not yet approved or cleared by the [place] FDA and  has been authorized for detection and/or diagnosis of SARS-CoV-2 by FDA under an Emergency Use Authorization (EUA). This EUA will remain  in effect (meaning this test can be used) for the duration of the COVID-19 declaration under Se ction 564(b)(1) of the Act, 21 U.S.C. section 360bbb-3(b)(1), unless the authorization is terminated or revoked sooner.  Performed at [facility], [place] [address] [place], [place] [address]     Please note: You were cared for by a hospitalist during your hospital stay. Once you are discharged,  your primary care physician will handle any further medical issues. Please note that NO REFILLS for any discharge medications will be authorized once you are discharged, as it is imperative that you return to your primary care physician (or establish a relationship with a primary care physician if you do not have one) for your post hospital discharge needs so that they can reassess your need for medications and monitor your lab values.    Time coordinating discharge: 40 minutes  SIGNED:   Shelly Coss, MD  [facility] 01/20/2021, 1:58 PM Pager [phone]  If 7PM-7AM, please contact night-coverage [email or web address] Password [email or web address]

## 2021-01-20 NOTE — Progress Notes (Signed)
Progress Note  Chief Complaint:    lower GI bleeding     ASSESSMENT AND PLAN   # 75 yo male with ESRD on HD, Afib, DM and cirrhosis ( followed by [facility] GI). Patient admitted 8/19 with hematochezia following a colonoscopy with biopsy of a large polypoid splenic flexure mass ( Dr. Gala Romney on 8/18).  Repeat colonoscopy yesterday suggests inflammatory splenic flexure mass to be likely source of bleeding. A 15 mm descending colon polyp was removed piecemeal fashion. Additional polyps left in place due to bleeding risk and low suspicion of malignancy.  --Follow up with primary GI, Dr. Gala Romney --We will notify him of polyp path results   # Acute on chronic anemia. Baseline hgb 9-10, down to 5.9.  Required several units of PRBC with improvement in hgb to 9.8.        DIAGNOSTIC STUDIES   01/16/21 colonoscopy  ( Dr. Gala Romney) for FOBT+ -Portal colopathy. Large rectal varices. - Polypoid mass at the splenic flexure biopsied. - Left colon polyps as described above?not removed. Left-sided diverticulosis. - No doubt patient has been bleeding from the splenic flexure lesion. Findings on EGD concerning for variceal hemorrhage as well   01/19/21 colonoscopy  -One 15 mm polyp in the proximal descending colon, removed piecemeal using a hot snare. Polypectomy technically difficult given its location just proximal to a fixed angulation in the colon, as well as poor visualization from poor insufllation and excessive peristalsis. Resected and retrieved. Clips (MR conditional) were placed. - One 6 mm polyp in the ascending colon. - Benign polypoid lesion at the splenic flexure, likely inflammatory polyp. This was felt to be the source of bleeding given the adherent clot and the reported friability and excessive bleeding following biopsy reported on previous colonoscopy. Biopsies showed inflammatory changes only, no dysplasia - 12 mm polyp in the sigmoid colon. - Diverticulosis in the sigmoid colon and in  the descending colon. - The examined portion of the ileum was normal. - Rectal varices. - The other polyps were not removed because of suspected lower risk of malignant transformation and a relatively high risk of bleeding which was felt to outweigh benefits of polypectomy - Due to the long procedure time and confident bleeding source in the lower GI tract, decision was made not to proceed with planned upper endoscopy (pt had no hematemesis/melena, BUN not significantly elevated, no upper GI symptoms) - Of note, we received notification during the procedure that the patient's hemo   SUBJECTIVE   No complaints. No further hematochezia       OBJECTIVE      Scheduled inpatient medications:   ALPRAZolam  0.5 mg Oral Q M,W,F-HD   Chlorhexidine Gluconate Cloth  6 each Topical Q0600   diltiazem  120 mg Oral Daily   furosemide  80 mg Oral Daily   gabapentin  100 mg Oral Q supper   hydrOXYzine  25 mg Oral TID   insulin aspart  0-5 Units Subcutaneous QHS   insulin aspart  0-6 Units Subcutaneous TID WC   insulin aspart  2 Units Subcutaneous TID WC   insulin glargine-yfgn  5 Units Subcutaneous QHS   lactulose  30 g Oral QHS   lactulose  30 g Oral Once per day on Sun Tue Thu Sat   levofloxacin  500 mg Oral Q M,W,F-2000   metoprolol succinate  25 mg Oral QHS   midodrine  10 mg Oral BID WC   mirtazapine  30 mg Oral Q supper  omega-3 acid ethyl esters  2 g Oral BID   pantoprazole  80 mg Oral Daily   pravastatin  20 mg Oral q AM   rifaximin  550 mg Oral BID   sodium chloride flush  3 mL Intravenous Q12H   spironolactone  50 mg Oral q AM   thiamine  100 mg Oral q AM   venlafaxine  75 mg Oral TID WC   Continuous inpatient infusions:   sodium chloride     sodium chloride     sodium chloride     sodium chloride     PRN inpatient medications: sodium chloride, sodium chloride, sodium chloride, lidocaine (PF), lidocaine-prilocaine, oxyCODONE-acetaminophen, pentafluoroprop-tetrafluoroeth,  sodium chloride flush, tiZANidine  Vital signs in last 24 hours: Temp:  [98.2 F (36.8 C)-98.6 F (37 C)] 98.5 F (36.9 C) (08/22 0733) Pulse Rate:  [77-85] 77 (08/22 0733) Resp:  [17-19] 17 (08/22 0733) BP: (140-164)/(53-72) 146/69 (08/22 0733) SpO2:  [87 %-100 %] 97 % (08/22 0733) Weight:  [92.9 kg] 92.9 kg (08/22 0024) Last BM Date: 01/19/21  Intake/Output Summary (Last 24 hours) at 01/20/2021 1147 Last data filed at 01/20/2021 0432 Gross per 24 hour  Intake 1475 ml  Output 300 ml  Net 1175 ml     Physical Exam:  General: Alert male in NAD Heart:  Regular rate  Pulmonary: Normal respiratory effort Abdomen: Soft, nondistended, nontender. Normal bowel sounds.  Neurologic: Alert and oriented Psych: Pleasant. Cooperative.   Filed Weights   01/18/21 0911 01/19/21 0404 01/20/21 0024  Weight: 94.4 kg 92.2 kg 92.9 kg    Intake/Output from previous day: 08/21 0701 - 08/22 0700 In: 2090 [P.O.:840; I.V.:300; Blood:950] Out: 300 [Urine:300] Intake/Output this shift: No intake/output data recorded.    Lab Results: Recent Labs    01/18/21 0247 01/18/21 1015 01/18/21 1444 01/19/21 0434 01/19/21 0832 01/19/21 2130  WBC 5.3 4.6  --  4.1  --   --   HGB 5.9* 6.8*   < > 7.3* 6.6* 9.8*  HCT 17.9* 20.3*   < > 21.0* 20.2* 29.3*  PLT 115* 79*  --  72*  --   --    < > = values in this interval not displayed.   BMET Recent Labs    01/17/21 1227 01/18/21 0247 01/19/21 0434  NA 129* 125*  130* 134*  K 4.3 4.3  4.4 3.8  CL 92* 94*  97* 101  CO2  --  22  22 22   GLUCOSE 218* 167*  167* 203*  BUN 23 27*  27* 16  CREATININE 3.70* 4.18*  4.09* 2.98*  CALCIUM  --  7.7*  7.9* 7.8*   LFT Recent Labs    01/18/21 0247  ALBUMIN 2.0*   PT/INR Recent Labs    01/17/21 1250  LABPROT 16.7*  INR 1.4*   Hepatitis Panel Recent Labs    01/18/21 1444  HEPBSAG NON REACTIVE    No results found.      Principal Problem:   GI bleed Active Problems:    Alcoholic cirrhosis of liver with ascites (HCC)   Esophageal varices in alcoholic cirrhosis (HCC)   Essential hypertension, benign   Mixed hyperlipidemia   Type 2 diabetes mellitus ([place])   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[place])   Acute blood loss anemia   Chronic diastolic HF (heart failure) (HCC)   Hyponatremia   Thrombocytopenia (HCC)   Rectal varices     LOS: 3 days   Tye Savoy ,NP 01/20/2021, 11:47 AM

## 2021-01-20 NOTE — TOC Transition Note (Signed)
Transition of Care [facility]) - CM/SW Discharge Note   Patient Details  Name: Jeffrey [name] MRN: [id number] Date of Birth: Jun 26, 1945  Transition [facility] Care [facility]) CM/SW Contact:  Zenon Mayo, RN Phone Number: 01/20/2021, 2:37 PM   Clinical Narrative:    NCM spoke with wife, she states they do not have a preference of the [facility] agency.  NCM made referral to [facility] with [facility].  Soc will begin 24 to 48 hrs post dc.  Patient has a walker and home oxygen with [employer].  Wife will transport him home after HD.      Final next level of care: [place] Barriers to Discharge: No Barriers Identified   Patient Goals and CMS Choice Patient states their goals for this hospitalization and ongoing recovery are:: return home CMS Medicare.gov Compare Post Acute Care list provided to:: Patient Represenative (must comment) Choice offered to / list presented to : Spouse  Discharge Placement                       Discharge Plan and Services   Discharge Planning Services: CM Consult Post Acute Care Choice: Home Health            DME Agency: NA       HH Arranged: PT HH Agency: [place] Date [facility] Agency Contacted: 01/20/21 Time [place]: [id number] Representative spoke with at [place]: [place] ([place]) Interventions     Readmission Risk Interventions Readmission Risk Prevention Plan 01/20/2021 04/03/2020  Transportation Screening Complete Complete  PCP or Specialist Appt within 3-5 Days Complete -  HRI or Home Care Consult Complete -  Social Work Consult for [place] Planning/Counseling Complete -  Palliative Care Screening Not Applicable -  Medication Review Press photographer) Complete Complete  HRI or [place] - Complete  SW Recovery Care/Counseling Consult - Complete  Palliative Care Screening - Not [place] - Not Applicable  Some recent data might be hidden

## 2021-01-20 NOTE — Progress Notes (Signed)
[name] Progress Note   Assessment/ Plan:    Jeffrey [name]- records pending  Assessment/Plan: GI bleed: in setting of recent EGD and c-scope Thursday.  4 u pRBCs given.  Had mass at splenic flexure biopsied-> ? Potential source (although he has many) GI consulted. Pt has severe esophageal and colonic varices. Colonscopy w/ large rectal varices. EGD showed esophageal varices w/ some ulceration. 4 esoph varices were banded. Also +portal htn'sive gastropathy of entire stomach w/ friable mucosa.  ESRD: normally MWF HD.  No heparin.  HD today.  HTN/ volume: 2.7 UF on Sat HD. Cont to lower vol as tolerated Anemia of ESRD/ ABLA: got 4 u pRBCs, get records for ESA Metabolic Bone Disease: VDRA and binders when eating DM II: per primary Etoh cirrhosis w/ recurrent ascites: gets taps every 2 weeks- monitor here for need  Kelly Splinter, MD 01/20/2021, 1:13 PM      Subjective:     BP's down 150/75 range.     Objective:   BP (!) 150/72 (BP Location: Right Arm)   Pulse 66   Temp 98.3 F (36.8 C) (Oral)   Resp 20   Ht 5\' 6"  (1.676 m)   Wt 92.9 kg   SpO2 100%   BMI 33.06 kg/m   Physical Exam see exam 01/18/21: GEN: NAD, sitting in bed HEENT EOMI PERRL NECK no JVD PULM clear anteriorly CV RRR today ABD soft, + fluid wave EXT 1+ LE edema NEURO AAO x 3 nonfocal, no asterixis ACCESS L AVF + T/B SKIN: thinned with multiple bruises  Labs: BMET Recent Labs  Lab 01/16/21 0926 01/17/21 1147 01/17/21 1227 01/18/21 0247 01/19/21 0434  NA 126* 127* 129* 125*  130* 134*  K 4.9 4.4 4.3 4.3  4.4 3.8  CL 93* 94* 92* 94*  97* 101  CO2  --  22  --  22  22 22   GLUCOSE 136* 221* 218* 167*  167* 203*  BUN 21 26* 23 27*  27* 16  CREATININE 2.80* 3.68* 3.70* 4.18*  4.09* 2.98*  CALCIUM  --  8.0*  --  7.7*  7.9* 7.8*  PHOS  --   --   --  6.5*  --     CBC Recent Labs  Lab 01/17/21 1147 01/17/21 1227 01/18/21 0247 01/18/21 1015 01/18/21 1444 01/19/21 0434  01/19/21 0832 01/19/21 2130 01/20/21 1202  WBC 5.8  --  5.3 4.6  --  4.1  --   --   --   NEUTROABS 4.9  --   --   --   --   --   --   --   --   HGB 5.9*   < > 5.9* 6.8*   < > 7.3* 6.6* 9.8* 9.8*  HCT 18.5*   < > 17.9* 20.3*   < > 21.0* 20.2* 29.3* 30.0*  MCV 104.5*  --  96.2 96.7  --  92.1  --   --   --   PLT 106*  --  115* 79*  --  72*  --   --   --    < > = values in this interval not displayed.       Medications:     ALPRAZolam  0.5 mg Oral Q M,W,F-HD   Chlorhexidine Gluconate Cloth  6 each Topical Q0600   diltiazem  120 mg Oral Daily   furosemide  80 mg Oral Daily   gabapentin  100 mg Oral Q supper   hydrOXYzine  25  mg Oral TID   insulin aspart  0-5 Units Subcutaneous QHS   insulin aspart  0-6 Units Subcutaneous TID WC   insulin aspart  2 Units Subcutaneous TID WC   insulin glargine-yfgn  5 Units Subcutaneous QHS   lactulose  30 g Oral QHS   lactulose  30 g Oral Once per day on Sun Tue Thu Sat   levofloxacin  500 mg Oral Q M,W,F-2000   metoprolol succinate  25 mg Oral QHS   midodrine  10 mg Oral BID WC   mirtazapine  30 mg Oral Q supper   omega-3 acid ethyl esters  2 g Oral BID   pantoprazole  80 mg Oral Daily   pravastatin  20 mg Oral q AM   rifaximin  550 mg Oral BID   sodium chloride flush  3 mL Intravenous Q12H   spironolactone  50 mg Oral q AM   thiamine  100 mg Oral q AM   venlafaxine  75 mg Oral TID WC

## 2021-01-20 NOTE — Evaluation (Signed)
Physical Therapy Evaluation Patient Details Name: Jeffrey [name] MRN: [id number] DOB: 16-Feb-1946 Today's Date: 01/20/2021   History of Present Illness  Jeffrey [name] is a 75 y.o. male who underwent an EGD and colonoscopy on 8/19 followed by several bouts of bloody stool, felt week, went to [facility] found to have HG of 5.9. PMH: ESRD, alcoholic cirrhosis of the the liver with ascites, esophageal varices, HTN, T2DM PSH: pt q2 week routine amniocentesis for ascites, DIA MWF   Clinical Impression  Pt admitted with above. Pt functioning near baseline but dependent on rollator as pt with high fall risk, and a few recent falls. Pt functioning at minA level. Pt to benefit from HHPT to address impaired balance, activity tolerance, and decreased strength. Acute PT to cont to follow.     Follow Up Recommendations Home health PT;Supervision - Intermittent    Equipment Recommendations  None recommended by PT (has DME)    Recommendations for Other Services       Precautions / Restrictions Precautions Precautions: Fall Precaution Comments: distended abdomen Restrictions Weight Bearing Restrictions: No      Mobility  Bed Mobility Overal bed mobility: Needs Assistance Bed Mobility: Rolling;Sidelying to Sit Rolling: Supervision Sidelying to sit: Min assist       General bed mobility comments: HOB flat, use of bed rail, minAf or trunk elevation, increased time due to body habitus with distendended abdomen    Transfers Overall transfer level: Needs assistance Equipment used: None Transfers: Sit to/from [facility] Sit to Stand: Min guard Stand pivot transfers: Min guard       General transfer comment: increased time, pushed up from bed, reached for arm rest of BSC, slow, guarded step during std pvt  Ambulation/Gait Ambulation/Gait assistance: Min guard Gait Distance (Feet): 120 Feet Assistive device: Rolling walker (2 wheeled) Gait Pattern/deviations: Step-through  pattern;Decreased stride length;Trunk flexed Gait velocity: dec but appropriate Gait velocity interpretation: 1.31 - 2.62 ft/sec, indicative of limited community ambulator General Gait Details: decreased step height, min guard for safety around obstacles, mild SOB, SpO2 93% on 2LO2 via   Stairs            Wheelchair Mobility    Modified Rankin (Stroke Patients Only)       Balance Overall balance assessment: Mild deficits observed, not formally tested                                           Pertinent Vitals/Pain Pain Assessment: No/denies pain    Home Living Family/patient expects to be discharged to:: Private residence Living Arrangements: Spouse/significant other Available Help at Discharge: Family;Available PRN/intermittently Type of Home: House Home Access: Ramped entrance     Home Layout: Able to live on main level with bedroom/bathroom Home Equipment: Walker - 2 wheels;Walker - 4 wheels;Cane - single point;Shower seat - built in;Grab bars - tub/shower;Bedside commode      Prior Function Level of Independence: Independent with assistive device(s)         Comments: no IADLs, doesn't drive, uses rollator primarily     Hand Dominance   Dominant Hand: Right    Extremity/Trunk Assessment   Upper Extremity Assessment Upper Extremity Assessment: Generalized weakness    Lower Extremity Assessment Lower Extremity Assessment: Generalized weakness       Communication   Communication: No difficulties  Cognition Arousal/Alertness: Awake/alert Behavior During Therapy: WFL for tasks assessed/performed  Overall Cognitive Status: Within Functional Limits for tasks assessed                                 General Comments: pt very tangential, loves to talk but appropriate, able to follow commands and demos good safety awareness      General Comments General comments (skin integrity, edema, etc.): pt with R UE swelling and  bruising at elbow, dressing covering wound    Exercises     Assessment/Plan    PT Assessment Patient needs continued PT services  PT Problem List Decreased activity tolerance;Decreased balance;Decreased mobility;Decreased knowledge of use of DME       PT Treatment Interventions DME instruction;Gait training;Functional mobility training;Therapeutic activities;Therapeutic exercise;Balance training    PT Goals (Current goals can be found in the Care Plan section)  Acute Rehab PT Goals Patient Stated Goal: home ASAP PT Goal Formulation: With patient Time For Goal Achievement: 02/03/21 Potential to Achieve Goals: Good    Frequency Min 3X/week   Barriers to discharge        Co-evaluation               AM-PAC PT "6 Clicks" Mobility  Outcome Measure Help needed turning from your back to your side while in a flat bed without using bedrails?: None Help needed moving from lying on your back to sitting on the side of a flat bed without using bedrails?: None Help needed moving to and from a bed to a chair (including a wheelchair)?: A Little Help needed standing up from a chair using your arms (e.g., wheelchair or bedside chair)?: A Little Help needed to walk in hospital room?: A Little Help needed climbing 3-5 steps with a railing? : A Lot 6 Click Score: 19    End of Session Equipment Utilized During Treatment: Gait belt;Oxygen (2LO2) Activity Tolerance: Patient tolerated treatment well Patient left: in chair;with call bell/phone within reach;with chair alarm set Nurse Communication: Mobility status PT Visit Diagnosis: Unsteadiness on feet (R26.81);Other abnormalities of gait and mobility (R26.89);Muscle weakness (generalized) (M62.81);Difficulty in walking, not elsewhere classified (R26.2)    Time: 0940-1005 PT Time Calculation (min) (ACUTE ONLY): 25 min   Charges:   PT Evaluation $PT Eval Moderate Complexity: 1 Mod PT Treatments $Gait Training: 8-22 mins         Jeffrey [name], PT, DPT Acute Rehabilitation Services Pager #: [phone] Office #: [phone]   Jeffrey [name] 01/20/2021, 10:59 AM

## 2021-01-20 NOTE — TOC Initial Note (Signed)
Transition of Care [facility]) - Initial/Assessment Note    Patient Details  Name: Jeffrey [name] MRN: [id number] Date of Birth: February 20, 1946  Transition of Care [facility]) CM/SW Contact:    Zenon Mayo, RN Phone Number: 01/20/2021, 2:35 PM  Clinical Narrative:                 NCM spoke with wife, she states they do not have a preference of the [facility] agency.  NCM made referral to [facility] with [facility]  Soc will begin 24 to 48 hrs post dc.  Patient has a walker and home oxygen with [employer].  Wife will transport him home after HD.   Expected Discharge Plan: [place] Barriers to Discharge: No Barriers Identified   Patient Goals and CMS Choice Patient states their goals for this hospitalization and ongoing recovery are:: return home CMS Medicare.gov Compare Post Acute Care list provided to:: Patient Represenative (must comment) Choice offered to / list presented to : Spouse  Expected Discharge Plan and Services Expected Discharge Plan: [place]   Discharge Planning Services: CM Consult Post Acute Care Choice: [place] arrangements for the past 2 months: Single Family Home Expected Discharge Date: 01/20/21                 DME Agency: NA       HH Arranged: PT HH Agency: [place] Date [facility] Agency Contacted: 01/20/21 Time HH Agency Contacted: 91 Representative spoke with at [place]: Tommi Rumps  Prior Living Arrangements/Services Living arrangements for the past 2 months: [place] Lives with:: Spouse Patient language and need for interpreter reviewed:: Yes Do you feel safe going back to the place where you live?: Yes      Need for Family Participation in Patient Care: Yes (Comment) Care giver support system in place?: Yes (comment) Current home services: DME (walker , home oxygen ([employer])) Criminal Activity/Legal Involvement Pertinent to Current Situation/Hospitalization: No - Comment as needed  Activities of Daily  Living Home Assistive Devices/Equipment: Walker (specify type) ADL Screening (condition at time of admission) Patient's cognitive ability adequate to safely complete daily activities?: Yes Is the patient deaf or have difficulty hearing?: Yes Does the patient have difficulty seeing, even when wearing glasses/contacts?: No Does the patient have difficulty concentrating, remembering, or making decisions?: No Patient able to express need for assistance with ADLs?: Yes Does the patient have difficulty dressing or bathing?: No Independently performs ADLs?: Yes (appropriate for developmental age) Communication: Appropriate for developmental age Is this a change from baseline?: Pre-admission baseline Dressing (OT): Appropriate for developmental age Is this a change from baseline?: Pre-admission baseline Grooming: Independent Feeding: Independent Bathing: Appropriate for developmental age Is this a change from baseline?: Pre-admission baseline Toileting: Needs assistance Is this a change from baseline?: Pre-admission baseline In/Out Bed: Needs assistance Is this a change from baseline?: Pre-admission baseline Walks in Home: Independent with device (comment) Does the patient have difficulty walking or climbing stairs?: Yes Weakness of Legs: Both Weakness of Arms/Hands: None  Permission Sought/Granted                  Emotional Assessment       Orientation: : Oriented to Self, Oriented to Place, Oriented to  Time, Oriented to Situation Alcohol / Substance Use: Not Applicable Psych Involvement: No (comment)  Admission diagnosis:  Rectal bleeding [K62.5] Anemia due to acute blood loss [D62] Anemia, unspecified type [D64.9] Patient Active Problem List  Diagnosis Date Noted   Rectal varices    Hyponatremia 01/17/2021   Thrombocytopenia ([place]) 01/17/2021   B12 deficiency 11/21/2020   Iron deficiency anemia due to chronic blood loss 06/25/2020   Gastroesophageal reflux disease  06/06/2020   Bacteremia due to Gram-positive bacteria 04/03/2020   Acute on chronic respiratory failure with hypoxia and hypercapnia (HCC) 04/03/2020   Lobar pneumonia ([place]) 04/03/2020   Volume overload 04/02/2020   Anxiety with depression 04/02/2020   Asymptomatic bacteriuria 04/02/2020   Pain of upper abdomen 02/22/2020   Urine leukocytes increased 02/22/2020   Fall    Minor head injury    Chronic diastolic HF (heart failure) ([place])    Physical deconditioning    Acute hyponatremia 02/19/2020   Fever and chills 02/09/2020   Advanced care planning/counseling discussion    DNR (do not resuscitate)    Alcoholic cirrhosis of liver ([place]) 02/08/2020   Back pain 02/08/2020   Status post thoracentesis    Hepatic encephalopathy ([place]) 01/01/2020   Bilateral pleural effusion 12/31/2019   Hypoglycemia 12/31/2019   Hyperammonemia ([place]) 12/31/2019   Class 2 obesity 12/31/2019   Hypoalbuminemia 12/31/2019   Nosebleed    Goals of care, counseling/discussion    Palliative care by specialist    Acute hepatic encephalopathy 12/13/2019   Encephalopathy acute    Hyperkalemia    Chronic hyponatremia    Anemia 11/16/2019   Acute blood loss anemia 09/20/2019   GI bleed    Acute on chronic anemia 09/18/2019   Generalized weakness 09/18/2019   Type 2 diabetes mellitus ([place]) 09/18/2019   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[place]) 09/18/2019   Enteritis due to Clostridium difficile 08/31/2019   Edema 02/40/9735   Alcoholic cirrhosis ([place]) 32/99/2426   Type 2 diabetes mellitus with stage 3 chronic kidney disease, with long-term current use of insulin ([place]) 10/20/2017   Essential hypertension, benign 10/20/2017   Morbid obesity ([place]) 10/20/2017   Mixed hyperlipidemia 10/20/2017   Ascites    SBP (spontaneous bacterial peritonitis) ([place]) 01/12/2016   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 [place] [address] Phone: [phone] Fax: [phone]     Social Determinants of Health (SDOH) Interventions    Readmission Risk Interventions Readmission Risk Prevention Plan 01/20/2021 04/03/2020  Transportation Screening Complete Complete  PCP or Specialist Appt within 3-5 Days Complete -  HRI or Home Care Consult Complete -  Social Work Consult for [place] Planning/Counseling Complete -  Palliative Care Screening Not Applicable -  Medication Review Press photographer) Complete Complete  HRI or [place] - Complete  SW Recovery Care/Counseling Consult - Complete  Palliative Care Screening - Not Applicable  Skilled Nursing Facility - Not Applicable  Some recent data might be hidden

## 2021-01-21 ENCOUNTER — Other Ambulatory Visit: Payer: Self-pay | Admitting: *Deleted

## 2021-01-21 LAB — SURGICAL PATHOLOGY

## 2021-01-21 NOTE — Patient Outreach (Signed)
[facility]) Care Management  01/21/2021  HEITOR STEINHOFF 1946/01/27 [id number]  Hospitalized 8/19-8/22 Primary to complete transition of care Telephone  Assessment-Successful-Diabetes  RN spoke with pt's spouse [name] who provider update on pt's ongoing management of care. Updated noted within the discussed plan of care along with pt's progress. All goals and interventions discussed and RN answered all inquires. Pt has a pending hospital follow up appointment with his primary on Thursday. No acute needs as pt will start back on his [facility] days tomorrow.  Will follow up next month with ongoing case management services and continue to address pt's ongoing need.    Goals Addressed             This Visit's Progress    THN-Disease Progression Prevented or Minimized   On track    Follow up date: 02/20/2021 Timeframe:  Short-Term Goal Priority:  Medium Start Date:     07/23/2020                        Expected End Date:  02/28/2021                      Evidence-based guidance:   Prepare patient for laboratory tests, such as lipid profile, microalbuminuria, estimated glomerular filtration rate and diagnostic exams based on risk and presentation.  Assess signs/symptoms and risk factors for hypertension, dyslipidemia, nephropathy, neuropathy and retinopathy.  Encourage lifestyle changes, such as increased intake of plant-based foods, stress reduction, regular physical activity and smoking cessation to prevent long-term complications and chronic diseases.  Discourage sedentary behavior or sitting longer than 30 minutes without interruption; individualize exercise or activity recommendations with potential limitations.    Implement additional individualized goals and interventions based on identified risk factors.  Prepare patient for consultation or referral for specialist care, such as ophthalmology, neurology, cardiology, podiatry, nephrology or perinatology. All that imply related to  pt's medical history.   Barriers: Health Behaviors  Notes: 8/23- Pt recently hospitalized for GI bleed and received 6 units of PRBCs reported by his spouse [name]. Pt doing well today and back on his HD scheduled starting tomorrow. No new medications as pt continue adherence to the recommended plan of care and discharge orders have been reviewed via PCP pending appointment on Thursday. CBG range from 160-170 since discharge with l the added stress however pt recovering fairly well with no acute needs at this time. 6/23-Continue to verify pt continues to do will with no acute issues or needs at this time.Will continue to encouraged adherence through her primary caregiver [name]. 5/23-Verified pt continue weekly dialysis with no missed appointments (history) and has attended all medical appointments with his last PCP on 5/19 [name]. No major issues or acute needs at this time as pt and caregiver continue to manager the pt's care very well. Reports last A1C at 6.5 in April. Will continue praise all efforts in managing the pt's diabetes with the current management of care exhibited. No additional visits reported vi [facility] and pt continue to tolerate dialysis without acute issues. 4/19-Spouse reports pt continue HD 3 X weekly with no missed appointments. Pt continue to attend all medical appointments with sufficient transportation. No medication changes as pt remain adherent to taking all his medications. Pending appointments discussed with Oncologist on 4/21 and [name] for labs on 4/26. Pt had two falls several weeks ago with some bruises that are healing to his knees and toes. Verified  pt did not have a head injury and did not require hospital or ED visit. Encouraged ongoing use of his DME RW for safety precaution.  3/22-Pt active with his ongoing HD 3 X weekly and attendance to all appointments.Visit to [facility] today for fluid retention (usually 1 liter removed). Pt goes every few weeks due  to ongoing swelling to his LE.  Pt hx of anemia with Hgb 7 (being monitored). Spouse ([name]) feel pt may benefit from additional HHPT. Encouraged to make this request with [name] (receptive and she will inquire). Currently pt is using a RW unable to maintain stability with a cane at this time. Reports AM glucose at 140 and maintaining this average. Will continue to encouraged adherence and praised pt for his efforts in maintaining his health and adhering to all goals and interventions.  2/22-Will discuss all above and verified pt's has seen both his ophthalmologist and podiatry over the last year. Pt has been hospitalized with this years appointments and will rescheduled for his annuals. Stress the importance of completing this task. Verified no symptoms of hyper/hypoglycemia have occurred over the last month since the last conversation. Reports last Ac1 6.0 last week at the providers office and the last CBG read at 200. Aware this is elevated and RN case manager discuss ways to reduce this number. Pt has been lower in the past and has some stressors going on at this time.     THN-Learn more abont my health   On track    Follow Up Date 02/20/2021 Timeframe:  Long-Range Goal Priority:  Medium Start Date:    06/20/2020                         Expected End Date:    02/28/2021                   - make a list of questions - ask questions - repeat what I heard to make sure I understand - bring a list of my medicines to the visit - speak up when I don't understand   Barriers: Health Behaviors  Why is this important?   The best way to learn about your health and care is by talking to the doctor and nurse.  They will answer your questions and give you information in the way that you like best.    Notes:  6/23: Wife continues to be educated to assist pt with knowing more about pt's ongoing medical issues. Wife reports pt more aware of his medical issues and treatment options.  4/19- Will continue to  discussed long term goals and interventions related to understanding of all his medications and all his medical conditions that require daily monitoring.                 1/20-[place] your diet, keep all appointments for diease related illness, follow all recommendations for treatment management and always utilize prevention measures to avoid acute encounters.  Will verbalize pt's understanding and verify with teach-back method.     THN-Make and Keep All Appointments   On track    Follow Up Date : 02/20/2021 Timeframe:  Long-Range Goal Priority:  Medium Start Date:     05/23/2020                        Expected End Date:        04/30/2021                 -  call to cancel if needed - keep a calendar with prescription refill dates - keep a calendar with appointment dates   Barriers: Health Behaviors  Why is this important?   Part of staying healthy is seeing the doctor for follow-up care.  If you forget your appointments, there are some things you can do to stay on track.    Notes:  8/23-Pt recently discharged from the hospital due to GI bleed from a colonoscopy. Pt has spoke with his primary provider pending an appointment on Thursday. Pt remains able to attend all his medical appointments and is back on scheduled with is HD days (M-W-F). Pt continue to have sufficient transportation from his supportive wife.  7/28-Pt continue to keep all appointments with no reported delays. Pt states he is attending all HD appointments in additional to add days when needed via [name]. No needed refills as pt taking all medications as prescribed. 4/19 Will extend this goal to allow ongoing adherence as pt has missed some HD appointments over the last 3 months. Continue to stress the importance of pt attending all his medical appointments to avoid "no show" fees and acute events from happending related to missed HD appointments.  1/20-Spouse reports several appointments were rescheduled due to incremental  weather but verified. Pt missed two dialysis appointment however was able to re-stabilize fluid removed via dialysis on another day. Will strongly encourage adherence with all medical appointments.  12/21-Verified pt continue to remain adherent with all dialysis and medical appointments scheduled. Will extend to allow ongoing adherence.         Raina Mina, RN Care Management Coordinator [place] Office [phone]

## 2021-01-27 ENCOUNTER — Encounter (HOSPITAL_COMMUNITY): Payer: Self-pay | Admitting: Internal Medicine

## 2021-01-31 ENCOUNTER — Encounter: Payer: Self-pay | Admitting: Gastroenterology

## 2021-01-31 NOTE — Progress Notes (Signed)
Jeffrey [name],  The polyp that I removed during your recent procedure was completely benign but was proven to be a "pre-cancerous" polyp that MAY have grown into cancers if it had not been removed.  Studies shows that at least 20% of women over age 74 and 30% of men over age 27 have pre-cancerous polyps.  Based on current nationally recognized surveillance guidelines, I recommend that you have a repeat colonoscopy in 3 years.  However, given your age and comorbidities, I would recommend you have a discussion with your primary gastroenterologist about the benefits of further polyp surveillance.   If you develop any new rectal bleeding, abdominal pain or significant bowel habit changes, please contact me before then.

## 2021-02-04 ENCOUNTER — Other Ambulatory Visit: Payer: Self-pay

## 2021-02-04 ENCOUNTER — Ambulatory Visit (INDEPENDENT_AMBULATORY_CARE_PROVIDER_SITE_OTHER): Payer: Medicare Other

## 2021-02-04 DIAGNOSIS — R339 Retention of urine, unspecified: Secondary | ICD-10-CM | POA: Diagnosis not present

## 2021-02-04 NOTE — Progress Notes (Signed)
Cath Change/ Replacement  Patient is present today for a catheter change due to urinary retention.  63ml of water was removed from the balloon, a 16FR foley cath was removed with out difficulty.  Patient was cleaned and prepped in a sterile fashion with betadine. A 16 FR foley cath was replaced into the bladder no complications were noted Urine return was noted 61ml and urine was yellow in color. The balloon was filled with 70ml of sterile water. A leg bag was attached for drainage.  A night bag was also given to the patient and patient was given instruction on how to change from one bag to another. Patient was given proper instruction on catheter care.    Performed by: Lazariah Savard LPN  Follow up: Keep next scheduled [place]

## 2021-02-04 NOTE — Patient Instructions (Signed)
Foley Catheter Care and Patient Education  Perform catheter care every day.  You can do this while in the shower, but NOT while taking a tub bath.  You will need the following supplies: -mild soap, such as Dove -water -a clean washcloth (not one already used for bathing) or a 4x4 piece of gauze -1 Cath-Secure -night drainage bag -2 alcohol swabs  Was you hands thoroughly with soap and water Using mild soap and water, clan your genital area Men should retract the foreskin, if needed, and clean the area, including the penis Women should separate the labia, and clean the area from front to back  3. Clean your urinary opening, which is where the catheter enters your body. 4. Clean the catheter from where it enters your body and then down, away from your body.  Hold the catheter at the point it enters your body so that you don't put tension on it. 5. Rinse the area well and dry it gently.  Changing the drainage bag You will change your drainage bag twice a day -in the morning after you shower, change he night bag to the leg bag -at night before you go to bed, change the leg bag to the night bag  Wash your hands thoroughly with soap and water Empty the urine from the drainage bag into the toilet before you change it Pinch off the catheter with your fingers and disconnect the used bag Wipe the end of the catheter using an alcohol pad Wipe the connector on the bag using the second alcohol pad Connect the clean bag to the catheter and release your finger pinch Check all connections.  Straighten any kinks or twits in the tubing  Caring for the Leg bag -always wear the leg bag below your knee.  This will help it drain. -keep the leg bag secure with the velcro straps.  If the straps leave a mark on your leg they are to tight and should be loosened.  Leaving the straps too tight can decrease you circulation and lead to blood clots. -empty the leg bag through the spout at the bottom every  2-4 hours as needed.  Do not let the bag become completely full. -do not lie down for longer than 2 hours while you are wearing the leg bag.  Caring for the Night Bag -always keep the night bag below the level of your bladder . -to hang your night bag while you sleep, place a clean plastic bag inside of a wastebasket.  Hang the night bag on the inside of the wastebasket.  Cleaning the Drainage bag Wash you hands thoroughly with soap and water. Rinse the equipment with cool water.  Do not use hot water it can damage the plastic equipment. Was the equipment with a mild liquid detergent (ivory) and rinse with cool water. To decrease odor, fill the bag halfway with a mixture of 1 part vinegar and 3 parts water. Shake the bag and let it sit for 15 minutes. Rinse the bag with cool water and hang it up to dry.  Preventing infection -keep the drainage bag below the level of your bladder and off the floor at all times. -keep the catheter secured to your thigh to prevent it from moving. -do not lie on or block the flow of urine in the tubing. -shower daily to keep the catheter clean. -clean your hands before and after touching the catheter or bag. -the spout of the drainage bag should never touch the side of   the toilet or any emptying container.  Special Points -You may see some blood or urine around where the catheter enters your body, especially when walking or having a bowel movement.  This is normal, as long as there is urine draining into the drainage bag.  If you experience significant leakage around  catheter tube where it enters your urethra possibly associated with lower abdominal cramping you could be having a bladder spasm.  Please notify your doctor and we can prescribe you a medication to improve your symptoms. -drink 1-2 glasses of liquids every 2 hours while you're awake.  Call your doctor immediately if -your catheter comes out, do not try to replace it yourself -you have  temperature of 101F (38.8C) or higher -you have decrease in the amount of urine you are making -you have foul-smelling urine -you have bright red blood or large blood clots in your urine -you have abdominal pain and no urine in your catheter bag   

## 2021-02-10 ENCOUNTER — Other Ambulatory Visit (HOSPITAL_COMMUNITY): Payer: Medicare Other

## 2021-02-10 ENCOUNTER — Ambulatory Visit (HOSPITAL_COMMUNITY): Payer: Medicare Other

## 2021-02-11 ENCOUNTER — Inpatient Hospital Stay (HOSPITAL_COMMUNITY): Payer: Medicare Other

## 2021-02-11 ENCOUNTER — Inpatient Hospital Stay (HOSPITAL_COMMUNITY): Payer: Medicare Other | Attending: Physician Assistant

## 2021-02-11 ENCOUNTER — Encounter (HOSPITAL_COMMUNITY): Payer: Self-pay

## 2021-02-11 ENCOUNTER — Other Ambulatory Visit: Payer: Self-pay

## 2021-02-11 VITALS — BP 147/60 | HR 77 | Temp 96.6°F | Resp 18

## 2021-02-11 DIAGNOSIS — E538 Deficiency of other specified B group vitamins: Secondary | ICD-10-CM | POA: Diagnosis not present

## 2021-02-11 DIAGNOSIS — D500 Iron deficiency anemia secondary to blood loss (chronic): Secondary | ICD-10-CM

## 2021-02-11 LAB — CBC WITH DIFFERENTIAL/PLATELET
Abs Immature Granulocytes: 0.01 10*3/uL (ref 0.00–0.07)
Basophils Absolute: 0 10*3/uL (ref 0.0–0.1)
Basophils Relative: 1 %
Eosinophils Absolute: 0.2 10*3/uL (ref 0.0–0.5)
Eosinophils Relative: 6 %
HCT: 27.8 % — ABNORMAL LOW (ref 39.0–52.0)
Hemoglobin: 8.7 g/dL — ABNORMAL LOW (ref 13.0–17.0)
Immature Granulocytes: 0 %
Lymphocytes Relative: 9 %
Lymphs Abs: 0.3 10*3/uL — ABNORMAL LOW (ref 0.7–4.0)
MCH: 32.5 pg (ref 26.0–34.0)
MCHC: 31.3 g/dL (ref 30.0–36.0)
MCV: 103.7 fL — ABNORMAL HIGH (ref 80.0–100.0)
Monocytes Absolute: 0.2 10*3/uL (ref 0.1–1.0)
Monocytes Relative: 7 %
Neutro Abs: 2.4 10*3/uL (ref 1.7–7.7)
Neutrophils Relative %: 77 %
Platelets: 82 10*3/uL — ABNORMAL LOW (ref 150–400)
RBC: 2.68 MIL/uL — ABNORMAL LOW (ref 4.22–5.81)
RDW: 18.8 % — ABNORMAL HIGH (ref 11.5–15.5)
WBC: 3.2 10*3/uL — ABNORMAL LOW (ref 4.0–10.5)
nRBC: 0 % (ref 0.0–0.2)

## 2021-02-11 LAB — SAMPLE TO BLOOD BANK

## 2021-02-11 MED ORDER — CYANOCOBALAMIN 1000 MCG/ML IJ SOLN
1000.0000 ug | Freq: Once | INTRAMUSCULAR | Status: AC
  Administered 2021-02-11: 1000 ug via INTRAMUSCULAR
  Filled 2021-02-11: qty 1

## 2021-02-11 NOTE — Patient Instructions (Signed)
[facility]  Discharge Instructions: Thank you for choosing [facility] to provide your oncology and hematology care.  If you have a lab appointment with the [facility], please come in thru the Main Entrance and check in at the main information desk.  Wear comfortable clothing and clothing appropriate for easy access to any Portacath or PICC line.   We strive to give you quality time with your provider. You may need to reschedule your appointment if you arrive late (15 or more minutes).  Arriving late affects you and other patients whose appointments are after yours.  Also, if you miss three or more appointments without notifying the office, you may be dismissed from the clinic at the provider's discretion.      For prescription refill requests, have your pharmacy contact our office and allow 72 hours for refills to be completed.    Today you received the following B12 injection. Return as scheduled.   To help prevent nausea and vomiting after your treatment, we encourage you to take your nausea medication as directed.  BELOW ARE SYMPTOMS THAT SHOULD BE REPORTED IMMEDIATELY: *FEVER GREATER THAN 100.4 F (38 C) OR HIGHER *CHILLS OR SWEATING *NAUSEA AND VOMITING THAT IS NOT CONTROLLED WITH YOUR NAUSEA MEDICATION *UNUSUAL SHORTNESS OF BREATH *UNUSUAL BRUISING OR BLEEDING *URINARY PROBLEMS (pain or burning when urinating, or frequent urination) *BOWEL PROBLEMS (unusual diarrhea, constipation, pain near the anus) TENDERNESS IN MOUTH AND THROAT WITH OR WITHOUT PRESENCE OF ULCERS (sore throat, sores in mouth, or a toothache) UNUSUAL RASH, SWELLING OR PAIN  UNUSUAL VAGINAL DISCHARGE OR ITCHING   Items with * indicate a potential emergency and should be followed up as soon as possible or go to the Emergency Department if any problems should occur.  Please show the CHEMOTHERAPY ALERT CARD or IMMUNOTHERAPY ALERT CARD at check-in to the Emergency Department and triage  nurse.  Should you have questions after your visit or need to cancel or reschedule your appointment, please contact [facility] [phone]  and follow the prompts.  Office hours are 8:00 a.m. to 4:30 p.m. Monday - Friday. Please note that voicemails left after 4:00 p.m. may not be returned until the following business day.  We are closed weekends and major holidays. You have access to a nurse at all times for urgent questions. Please call the main number to the clinic [phone] and follow the prompts.  For any non-urgent questions, you may also contact your provider using MyChart. We now offer e-Visits for anyone 12 and older to request care online for non-urgent symptoms. For details visit [email or web address].   Also download the MyChart app! Go to the app store, search "MyChart", open the app, select [place], and log in with your MyChart username and password.  Due to Covid, a mask is required upon entering the hospital/clinic. If you do not have a mask, one will be given to you upon arrival. For doctor visits, patients may have 1 support person aged 96 or older with them. For treatment visits, patients cannot have anyone with them due to current Covid guidelines and our immunocompromised population.

## 2021-02-11 NOTE — Progress Notes (Signed)
Patient tolerated injection with no complaints voiced. Site clean and dry with no bruising or swelling noted at site. See MAR for details. Band aid applied.  Patient stable during and after injection. VSS with discharge and left in satisfactory condition with no s/s of distress noted.  

## 2021-02-12 ENCOUNTER — Encounter (HOSPITAL_COMMUNITY): Payer: Self-pay

## 2021-02-13 ENCOUNTER — Other Ambulatory Visit: Payer: Self-pay | Admitting: Gastroenterology

## 2021-02-20 ENCOUNTER — Other Ambulatory Visit: Payer: Self-pay | Admitting: *Deleted

## 2021-02-20 NOTE — Patient Outreach (Signed)
[name]) Care Management  02/20/2021  Jeffrey [name] [birth year] [id number]  Telephone Assessment-Successful-Diabetes  RN spoke directly with pt today and received an update on pt's ongoing management of care. Plan of care discussed with noted updates and pt continue to manage his ongoing care without major issues or encounters. Pt recent had a GI bleed last month and continues to recovery with no additional bleeds over the last months. Pt adherent with his ongoing dialysis MWF and has support system and sufficient transportation through his spouse Jeffrey [name]). No other needs or issues presented on this call.  Will continue to follow up accordingly next month with pt's ongoing progress and continue to stress the importance of managing his ongoing care.    Goals Addressed             This Visit's Progress    THN-Disease Progression Prevented or Minimized   On track    Follow up date: 03/20/2021 Timeframe:  Short-Term Goal Priority:  Medium Start Date:     07/23/2020                        Expected End Date:  03/31/2021                      Evidence-based guidance:   Prepare patient for laboratory tests, such as lipid profile, microalbuminuria, estimated glomerular filtration rate and diagnostic exams based on risk and presentation.  Assess signs/symptoms and risk factors for hypertension, dyslipidemia, nephropathy, neuropathy and retinopathy.  Encourage lifestyle changes, such as increased intake of plant-based foods, stress reduction, regular physical activity and smoking cessation to prevent long-term complications and chronic diseases.  Discourage sedentary behavior or sitting longer than 30 minutes without interruption; individualize exercise or activity recommendations with potential limitations.    Implement additional individualized goals and interventions based on identified risk factors.  Barriers: Health Behaviors  Notes: 9/22-Pt reports he continues to do  well with no additional bleeds and no new medications as pt continues to tolerate his ongoing dialysis with CBGs around 180-200 with no residual symptoms. 8/23- Pt recently hospitalized for GI bleed and received 6 units of PRBCs reported by his spouse Jeffrey [name]. Pt doing well today and back on his HD scheduled starting tomorrow. No new medications as pt continue adherence to the recommended plan of care and discharge orders have been reviewed via PCP pending appointment on Thursday. CBG range from 160-170 since discharge with l the added stress however pt recovering fairly well with no acute needs at this time. 6/23-Continue to verify pt continues to do will with no acute issues or needs at this time.Will continue to encouraged adherence through her primary caregiver Jeffrey [name]. 5/23-Verified pt continue weekly dialysis with no missed appointments (history) and has attended all medical appointments with his last PCP on 5/19 [name]. No major issues or acute needs at this time as pt and caregiver continue to manager the pt's care very well. Reports last A1C at 6.5 in April. Will continue praise all efforts in managing the pt's diabetes with the current management of care exhibited. No additional visits reported vi [facility] and pt continue to tolerate dialysis without acute issues. 4/19-Spouse reports pt continue HD 3 X weekly with no missed appointments. Pt continue to attend all medical appointments with sufficient transportation. No medication changes as pt remain adherent to taking all his medications. Pending appointments discussed with Oncologist on 4/21 and [name] for labs on  4/26. Pt had two falls several weeks ago with some bruises that are healing to his knees and toes. Verified pt did not have a head injury and did not require hospital or ED visit. Encouraged ongoing use of his DME RW for safety precaution.  3/22-Pt active with his ongoing HD 3 X weekly and attendance to all appointments.Visit to [facility] today for fluid retention (usually 1 liter removed). Pt goes every few weeks due to ongoing swelling to his LE.  Pt hx of anemia with Hgb 7 (being monitored). Spouse (Jeffrey [name]) feel pt may benefit from additional HHPT. Encouraged to make this request with [name] (receptive and she will inquire). Currently pt is using a RW unable to maintain stability with a cane at this time. Reports AM glucose at 140 and maintaining this average. Will continue to encouraged adherence and praised pt for his efforts in maintaining his health and adhering to all goals and interventions.  2/22-Will discuss all above and verified pt's has seen both his ophthalmologist and podiatry over the last year. Pt has been hospitalized with this years appointments and will rescheduled for his annuals. Stress the importance of completing this task. Verified no symptoms of hyper/hypoglycemia have occurred over the last month since the last conversation. Reports last Ac1 6.0 last week at the providers office and the last CBG read at 200. Aware this is elevated and RN case manager discuss ways to reduce this number. Pt has been lower in the past and has some stressors going on at this time.     THN-Learn more about my health   On track    Follow Up Date 03/20/2021 Timeframe:  Long-Range Goal Priority:  Medium Start Date:    06/20/2020                         Expected End Date:    03/31/2021                   - make a list of questions - ask questions - repeat what I heard to make sure I understand - bring a list of my medicines to the visit - speak up when I don't understand   Barriers: Health Behaviors  Why is this important?   The best way to learn about your health and care is by talking to the doctor and nurse.  They will answer your questions and give you information in the way that you like best.    Notes:  9/22- Pt well aware of his ongoing health and attempts to improve accordingly with all prevention  measures. 6/23: Wife continues to be educated to assist pt with knowing more about pt's ongoing medical issues. Wife reports pt more aware of his medical issues and treatment options.  4/19- Will continue to discussed long term goals and interventions related to understanding of all his medications and all his medical conditions that require daily monitoring.                 1/20-Mange your diet, keep all appointments for diease related illness, follow all recommendations for treatment management and always utilize prevention measures to avoid acute encounters.  Will verbalize pt's understanding and verify with teach-back method.     THN-Make and Keep All Appointments   On track    Follow Up Date : 03/20/2021 Timeframe:  Long-Range Goal Priority:  Medium Start Date:     05/23/2020  Expected End Date:        04/30/2021                 - call to cancel if needed - keep a calendar with prescription refill dates - keep a calendar with appointment dates   Barriers: Health Behaviors  Why is this important?   Part of staying healthy is seeing the doctor for follow-up care.  If you forget your appointments, there are some things you can do to stay on track.    Notes:  8/23-Pt recently discharged from the hospital due to GI bleed from a colonoscopy. Pt has spoke with his primary provider pending an appointment on Thursday. Pt remains able to attend all his medical appointments and is back on scheduled with is HD days (M-W-F). Pt continue to have sufficient transportation from his supportive wife.  7/28-Pt continue to keep all appointments with no reported delays. Pt states he is attending all HD appointments in additional to add days when needed via [name]. No needed refills as pt taking all medications as prescribed. 4/19 Will extend this goal to allow ongoing adherence as pt has missed some HD appointments over the last 3 months. Continue to stress the importance of pt  attending all his medical appointments to avoid "no show" fees and acute events from happending related to missed HD appointments.  1/20-Spouse reports several appointments were rescheduled due to incremental weather but verified. Pt missed two dialysis appointment however was able to re-stabilize fluid removed via dialysis on another day. Will strongly encourage adherence with all medical appointments.  12/21-Verified pt continue to remain adherent with all dialysis and medical appointments scheduled. Will extend to allow ongoing adherence.         Raina Mina, RN Care Management Coordinator [place] Office [phone]

## 2021-03-04 ENCOUNTER — Ambulatory Visit: Payer: Medicare Other | Admitting: Internal Medicine

## 2021-03-05 ENCOUNTER — Ambulatory Visit: Payer: Medicare Other | Admitting: Gastroenterology

## 2021-03-06 ENCOUNTER — Other Ambulatory Visit: Payer: Self-pay

## 2021-03-06 ENCOUNTER — Ambulatory Visit (INDEPENDENT_AMBULATORY_CARE_PROVIDER_SITE_OTHER): Payer: Medicare Other

## 2021-03-06 DIAGNOSIS — R339 Retention of urine, unspecified: Secondary | ICD-10-CM | POA: Diagnosis not present

## 2021-03-06 NOTE — Patient Instructions (Signed)
Foley Catheter Care and Patient Education  Perform catheter care every day.  You can do this while in the shower, but NOT while taking a tub bath.  You will need the following supplies: -mild soap, such as Dove -water -a clean washcloth (not one already used for bathing) or a 4x4 piece of gauze -1 Cath-Secure -night drainage bag -2 alcohol swabs  Was you hands thoroughly with soap and water Using mild soap and water, clan your genital area Men should retract the foreskin, if needed, and clean the area, including the penis Women should separate the labia, and clean the area from front to back  3. Clean your urinary opening, which is where the catheter enters your body. 4. Clean the catheter from where it enters your body and then down, away from your body.  Hold the catheter at the point it enters your body so that you don't put tension on it. 5. Rinse the area well and dry it gently.  Changing the drainage bag You will change your drainage bag twice a day -in the morning after you shower, change he night bag to the leg bag -at night before you go to bed, change the leg bag to the night bag  Wash your hands thoroughly with soap and water Empty the urine from the drainage bag into the toilet before you change it Pinch off the catheter with your fingers and disconnect the used bag Wipe the end of the catheter using an alcohol pad Wipe the connector on the bag using the second alcohol pad Connect the clean bag to the catheter and release your finger pinch Check all connections.  Straighten any kinks or twits in the tubing  Caring for the Leg bag -always wear the leg bag below your knee.  This will help it drain. -keep the leg bag secure with the velcro straps.  If the straps leave a mark on your leg they are to tight and should be loosened.  Leaving the straps too tight can decrease you circulation and lead to blood clots. -empty the leg bag through the spout at the bottom every  2-4 hours as needed.  Do not let the bag become completely full. -do not lie down for longer than 2 hours while you are wearing the leg bag.  Caring for the Night Bag -always keep the night bag below the level of your bladder . -to hang your night bag while you sleep, place a clean plastic bag inside of a wastebasket.  Hang the night bag on the inside of the wastebasket.  Cleaning the Drainage bag Wash you hands thoroughly with soap and water. Rinse the equipment with cool water.  Do not use hot water it can damage the plastic equipment. Was the equipment with a mild liquid detergent (ivory) and rinse with cool water. To decrease odor, fill the bag halfway with a mixture of 1 part vinegar and 3 parts water. Shake the bag and let it sit for 15 minutes. Rinse the bag with cool water and hang it up to dry.  Preventing infection -keep the drainage bag below the level of your bladder and off the floor at all times. -keep the catheter secured to your thigh to prevent it from moving. -do not lie on or block the flow of urine in the tubing. -shower daily to keep the catheter clean. -clean your hands before and after touching the catheter or bag. -the spout of the drainage bag should never touch the side of   the toilet or any emptying container.  Special Points -You may see some blood or urine around where the catheter enters your body, especially when walking or having a bowel movement.  This is normal, as long as there is urine draining into the drainage bag.  If you experience significant leakage around  catheter tube where it enters your urethra possibly associated with lower abdominal cramping you could be having a bladder spasm.  Please notify your doctor and we can prescribe you a medication to improve your symptoms. -drink 1-2 glasses of liquids every 2 hours while you're awake.  Call your doctor immediately if -your catheter comes out, do not try to replace it yourself -you have  temperature of 101F (38.8C) or higher -you have decrease in the amount of urine you are making -you have foul-smelling urine -you have bright red blood or large blood clots in your urine -you have abdominal pain and no urine in your catheter bag   

## 2021-03-06 NOTE — Progress Notes (Signed)
Cath Change/ Replacement ° °Patient is present today for a catheter change due to urinary retention.  10 ml of water was removed from the balloon, a 16 FR foley cath was removed with out difficulty.  Patient was cleaned and prepped in a sterile fashion with betadine. A 16 FR foley cath was replaced into the bladder no complications were noted Urine return was noted 20ml and urine was yellow in color. The balloon was filled with 10ml of sterile water. A leg bag was attached for drainage.  A night bag was also given to the patient and patient was given instruction on how to change from one bag to another. Patient was given proper instruction on catheter care.   ° °Performed by: Antoine Vandermeulen LPN ° °Follow up: Keep next scheduled NV °

## 2021-03-10 ENCOUNTER — Ambulatory Visit (HOSPITAL_COMMUNITY): Payer: Medicare Other

## 2021-03-10 ENCOUNTER — Other Ambulatory Visit (HOSPITAL_COMMUNITY): Payer: Medicare Other

## 2021-03-11 ENCOUNTER — Ambulatory Visit (HOSPITAL_COMMUNITY): Payer: Medicare Other

## 2021-03-11 ENCOUNTER — Inpatient Hospital Stay (HOSPITAL_COMMUNITY): Payer: Medicare Other | Attending: Hematology

## 2021-03-11 ENCOUNTER — Other Ambulatory Visit: Payer: Self-pay

## 2021-03-11 ENCOUNTER — Inpatient Hospital Stay (HOSPITAL_COMMUNITY): Payer: Medicare Other

## 2021-03-11 VITALS — BP 157/67 | HR 81 | Temp 97.0°F | Resp 18

## 2021-03-11 DIAGNOSIS — E538 Deficiency of other specified B group vitamins: Secondary | ICD-10-CM | POA: Insufficient documentation

## 2021-03-11 DIAGNOSIS — D500 Iron deficiency anemia secondary to blood loss (chronic): Secondary | ICD-10-CM

## 2021-03-11 LAB — CBC WITH DIFFERENTIAL/PLATELET
Abs Immature Granulocytes: 0.02 10*3/uL (ref 0.00–0.07)
Basophils Absolute: 0.1 10*3/uL (ref 0.0–0.1)
Basophils Relative: 1 %
Eosinophils Absolute: 0.1 10*3/uL (ref 0.0–0.5)
Eosinophils Relative: 3 %
HCT: 27.7 % — ABNORMAL LOW (ref 39.0–52.0)
Hemoglobin: 8.5 g/dL — ABNORMAL LOW (ref 13.0–17.0)
Immature Granulocytes: 0 %
Lymphocytes Relative: 5 %
Lymphs Abs: 0.3 10*3/uL — ABNORMAL LOW (ref 0.7–4.0)
MCH: 33.6 pg (ref 26.0–34.0)
MCHC: 30.7 g/dL (ref 30.0–36.0)
MCV: 109.5 fL — ABNORMAL HIGH (ref 80.0–100.0)
Monocytes Absolute: 0.3 10*3/uL (ref 0.1–1.0)
Monocytes Relative: 6 %
Neutro Abs: 4.1 10*3/uL (ref 1.7–7.7)
Neutrophils Relative %: 85 %
Platelets: 88 10*3/uL — ABNORMAL LOW (ref 150–400)
RBC: 2.53 MIL/uL — ABNORMAL LOW (ref 4.22–5.81)
RDW: 18.8 % — ABNORMAL HIGH (ref 11.5–15.5)
Smear Review: NORMAL
WBC: 4.9 10*3/uL (ref 4.0–10.5)
nRBC: 0 % (ref 0.0–0.2)

## 2021-03-11 MED ORDER — CYANOCOBALAMIN 1000 MCG/ML IJ SOLN
1000.0000 ug | Freq: Once | INTRAMUSCULAR | Status: AC
  Administered 2021-03-11: 1000 ug via INTRAMUSCULAR
  Filled 2021-03-11: qty 1

## 2021-03-11 NOTE — Progress Notes (Signed)
Jeffrey [name] presents today for B12 injection per the provider's orders.  Stable during administration without incident; injection site WNL; see MAR for injection details.  Patient tolerated procedure well and without incident.  No questions or complaints noted at this time. Discharge from clinic via wheelchair in stable condition.  Alert and oriented X 3.  Follow up with [facility] as scheduled.  °

## 2021-03-11 NOTE — Patient Instructions (Signed)
[facility]  Discharge Instructions: Thank you for choosing [facility] to provide your oncology and hematology care.  If you have a lab appointment with the Cancer Center, please come in thru the Main Entrance and check in at the main information desk.  Wear comfortable clothing and clothing appropriate for easy access to any Portacath or PICC line.   We strive to give you quality time with your provider. You may need to reschedule your appointment if you arrive late (15 or more minutes).  Arriving late affects you and other patients whose appointments are after yours.  Also, if you miss three or more appointments without notifying the office, you may be dismissed from the clinic at the provider's discretion.      For prescription refill requests, have your pharmacy contact our office and allow 72 hours for refills to be completed.    Today you received the following chemotherapy and/or immunotherapy agents B12      To help prevent nausea and vomiting after your treatment, we encourage you to take your nausea medication as directed.  BELOW ARE SYMPTOMS THAT SHOULD BE REPORTED IMMEDIATELY: *FEVER GREATER THAN 100.4 F (38 C) OR HIGHER *CHILLS OR SWEATING *NAUSEA AND VOMITING THAT IS NOT CONTROLLED WITH YOUR NAUSEA MEDICATION *UNUSUAL SHORTNESS OF BREATH *UNUSUAL BRUISING OR BLEEDING *URINARY PROBLEMS (pain or burning when urinating, or frequent urination) *BOWEL PROBLEMS (unusual diarrhea, constipation, pain near the anus) TENDERNESS IN MOUTH AND THROAT WITH OR WITHOUT PRESENCE OF ULCERS (sore throat, sores in mouth, or a toothache) UNUSUAL RASH, SWELLING OR PAIN  UNUSUAL VAGINAL DISCHARGE OR ITCHING   Items with * indicate a potential emergency and should be followed up as soon as possible or go to the Emergency Department if any problems should occur.  Please show the CHEMOTHERAPY ALERT CARD or IMMUNOTHERAPY ALERT CARD at check-in to the Emergency Department  and triage nurse.  Should you have questions after your visit or need to cancel or reschedule your appointment, please contact [facility] [phone]  and follow the prompts.  Office hours are 8:00 a.m. to 4:30 p.m. Monday - Friday. Please note that voicemails left after 4:00 p.m. may not be returned until the following business day.  We are closed weekends and major holidays. You have access to a nurse at all times for urgent questions. Please call the main number to the clinic [phone] and follow the prompts.  For any non-urgent questions, you may also contact your provider using MyChart. We now offer e-Visits for anyone 18 and older to request care online for non-urgent symptoms. For details visit [email or web address].   Also download the MyChart app! Go to the app store, search "MyChart", open the app, select [facility], and log in with your MyChart username and password.  Due to Covid, a mask is required upon entering the hospital/clinic. If you do not have a mask, one will be given to you upon arrival. For doctor visits, patients may have 1 support person aged 18 or older with them. For treatment visits, patients cannot have anyone with them due to current Covid guidelines and our immunocompromised population.  

## 2021-03-12 LAB — SAMPLE TO BLOOD BANK

## 2021-03-14 ENCOUNTER — Other Ambulatory Visit: Payer: Self-pay | Admitting: Internal Medicine

## 2021-03-20 ENCOUNTER — Other Ambulatory Visit: Payer: Self-pay | Admitting: *Deleted

## 2021-03-20 NOTE — Patient Outreach (Signed)
[name]) Care Management  03/20/2021  WENZEL BACKLUND [birth year] [id number]   Telephone Assessment-Successful-DM  RN spoke with pt's spouse Jeffrey [name]) who provided an update today of pt's ongoing care. States pt's doing well with his ongoing adherence and attending all medical appointments and dialysis. Spouse provided updates on pt's ongoing management of care and pt's adherence to the discussed plan of care today. No additional symptoms of bleeding as this issues has resolved. Updates noted within the plan of are.   Based upon pt's progress will discuss transfer to a health coach via [facility]. Wife receptive to the plan. No additional issues to address at this time as wife receptive to quarterly updates concerning the program. Will request follow up in Jan on pt's ongoing management of care and update the provider of this change.   Goals Addressed             This Visit's Progress    THN-Disease Progression Prevented or Minimized   On track    Follow up date: 07/01/2021 Quarterly follow ups Timeframe:  Short-Term Goal Priority:  Medium Start Date:     07/23/2020                        Expected End Date:  07/01/2021                      Evidence-based guidance:   Prepare patient for laboratory tests, such as lipid profile, microalbuminuria, estimated glomerular filtration rate and diagnostic exams based on risk and presentation.  Assess signs/symptoms and risk factors for hypertension, dyslipidemia, nephropathy, neuropathy and retinopathy.  Encourage lifestyle changes, such as increased intake of plant-based foods, stress reduction, regular physical activity and smoking cessation to prevent long-term complications and chronic diseases.  Discourage sedentary behavior or sitting longer than 30 minutes without interruption; individualize exercise or activity recommendations with potential limitations.    Implement additional individualized goals and interventions based on  identified risk factors.  Barriers: Health Behaviors  Notes: 10/20-Spouse reports pt continues to do well with the plan in place with his providers. Pt maintains with weight around 204 lbs (HF) and CBG 141 this AM (much improved from last month) will no reported issues. 9/22-Pt reports he continues to do well with no additional bleeds and no new medications as pt continues to tolerate his ongoing dialysis with CBGs around 180-200 with no residual symptoms. 8/23- Pt recently hospitalized for GI bleed and received 6 units of PRBCs reported by his spouse Jeffrey [name]. Pt doing well today and back on his HD scheduled starting tomorrow. No new medications as pt continue adherence to the recommended plan of care and discharge orders have been reviewed via PCP pending appointment on Thursday. CBG range from 160-170 since discharge with l the added stress however pt recovering fairly well with no acute needs at this time. 6/23-Continue to verify pt continues to do will with no acute issues or needs at this time.Will continue to encouraged adherence through her primary caregiver Jeffrey [name]. 5/23-Verified pt continue weekly dialysis with no missed appointments (history) and has attended all medical appointments with his last PCP on 5/19 Dr. Manuella [name]. No major issues or acute needs at this time as pt and caregiver continue to manager the pt's care very well. Reports last A1C at 6.5 in April. Will continue praise all efforts in managing the pt's diabetes with the current management of care exhibited. No additional visits reported vi [name] and  pt continue to tolerate dialysis without acute issues. 4/19-Spouse reports pt continue HD 3 X weekly with no missed appointments. Pt continue to attend all medical appointments with sufficient transportation. No medication changes as pt remain adherent to taking all his medications. Pending appointments discussed with Oncologist on 4/21 and [name] for labs on 4/26. Pt had two falls  several weeks ago with some bruises that are healing to his knees and toes. Verified pt did not have a head injury and did not require hospital or ED visit. Encouraged ongoing use of his DME RW for safety precaution.  3/22-Pt active with his ongoing HD 3 X weekly and attendance to all appointments.Visit to [facility] today for fluid retention (usually 1 liter removed). Pt goes every few weeks due to ongoing swelling to his LE.  Pt hx of anemia with Hgb 7 (being monitored). Spouse (Jeffrey [name]) feel pt may benefit from additional HHPT. Encouraged to make this request with Dr. Manuella [name] (receptive and she will inquire). Currently pt is using a RW unable to maintain stability with a cane at this time. Reports AM glucose at 140 and maintaining this average. Will continue to encouraged adherence and praised pt for his efforts in maintaining his health and adhering to all goals and interventions.  2/22-Will discuss all above and verified pt's has seen both his ophthalmologist and podiatry over the last year. Pt has been hospitalized with this years appointments and will rescheduled for his annuals. Stress the importance of completing this task. Verified no symptoms of hyper/hypoglycemia have occurred over the last month since the last conversation. Reports last Ac1 6.0 last week at the providers office and the last CBG read at 200. Aware this is elevated and RN case manager discuss ways to reduce this number. Pt has been lower in the past and has some stressors going on at this time.     COMPLETED: THN-Learn more about my health   On track    Follow Up Date 03/20/2021 Timeframe:  Long-Range Goal Priority:  Medium Start Date:    06/20/2020                         Expected End Date:    03/31/2021                   - make a list of questions - ask questions - repeat what I heard to make sure I understand - bring a list of my medicines to the visit - speak up when I don't understand   Barriers: Health Behaviors   Why is this important?   The best way to learn about your health and care is by talking to the doctor and nurse.  They will answer your questions and give you information in the way that you like best.    Notes:  9/22- Pt well aware of his ongoing health and attempts to improve accordingly with all prevention measures. 6/23: Wife continues to be educated to assist pt with knowing more about pt's ongoing medical issues. Wife reports pt more aware of his medical issues and treatment options.  4/19- Will continue to discussed long term goals and interventions related to understanding of all his medications and all his medical conditions that require daily monitoring.                 1/20-Mange your diet, keep all appointments for diease related illness, follow all recommendations for treatment management and always  utilize prevention measures to avoid acute encounters.  Will verbalize pt's understanding and verify with teach-back method.     THN-Make and Keep All Appointments   On track    Follow Up Date : 07/01/2021 Quarterly follow up Timeframe:  Long-Range Goal Priority:  Medium Start Date:     05/23/2020                        Expected End Date:  07/01/2021               - call to cancel if needed - keep a calendar with prescription refill dates - keep a calendar with appointment dates   Barriers: Health Behaviors  Why is this important?   Part of staying healthy is seeing the doctor for follow-up care.  If you forget your appointments, there are some things you can do to stay on track.    Notes:  10/20-Spouse reports pt doing well and currently continues to see Dr. Gala Romney (GI). No reported issues or events as pt continue to do well in managing his prior GI issues with no residual bleeding episodes. Verified pt's attendance to all his ongoing medical appointments and no missed days from dialysis.  8/23-Pt recently discharged from the hospital due to GI bleed from a colonoscopy. Pt has  spoke with his primary provider pending an appointment on Thursday. Pt remains able to attend all his medical appointments and is back on scheduled with is HD days (M-W-F). Pt continue to have sufficient transportation from his supportive wife.  7/28-Pt continue to keep all appointments with no reported delays. Pt states he is attending all HD appointments in additional to add days when needed via Dr. Manuella [name]. No needed refills as pt taking all medications as prescribed. 4/19 Will extend this goal to allow ongoing adherence as pt has missed some HD appointments over the last 3 months. Continue to stress the importance of pt attending all his medical appointments to avoid "no show" fees and acute events from happending related to missed HD appointments.  1/20-Spouse reports several appointments were rescheduled due to incremental weather but verified. Pt missed two dialysis appointment however was able to re-stabilize fluid removed via dialysis on another day. Will strongly encourage adherence with all medical appointments.  12/21-Verified pt continue to remain adherent with all dialysis and medical appointments scheduled. Will extend to allow ongoing adherence.         Raina Mina, RN Care Management Coordinator [place] Office [phone]

## 2021-03-27 ENCOUNTER — Other Ambulatory Visit: Payer: Self-pay | Admitting: *Deleted

## 2021-03-27 ENCOUNTER — Encounter: Payer: Self-pay | Admitting: *Deleted

## 2021-03-27 NOTE — Patient Instructions (Signed)
Goals Addressed             This Visit's Progress    THN-Disease Progression Prevented or Minimized   On track    Follow up date: 07/01/2021 Quarterly follow ups Timeframe:  Short-Term Goal Priority:  Medium Start Date:     07/23/2020                        Expected End Date:  [id number]                      Evidence-based guidance:   Prepare patient for laboratory tests, such as lipid profile, microalbuminuria, estimated glomerular filtration rate and diagnostic exams based on risk and presentation.  Assess signs/symptoms and risk factors for hypertension, dyslipidemia, nephropathy, neuropathy and retinopathy.  Encourage lifestyle changes, such as increased intake of plant-based foods, stress reduction, regular physical activity and smoking cessation to prevent long-term complications and chronic diseases.  Discourage sedentary behavior or sitting longer than 30 minutes without interruption; individualize exercise or activity recommendations with potential limitations.    Implement additional individualized goals and interventions based on identified risk factors.  Barriers: Health Behaviors  Notes: [id number] Patient FBS is 94. His A1C is 5.9 Patient rests on the days between his dialysis.      THN-Make and Keep All Appointments   On track    Follow Up Date : 07/01/2021 Quarterly follow up Timeframe:  Long-Range Goal Priority:  Medium Start Date:     05/23/2020                        Expected End Date:  [id number]              - call to cancel if needed - keep a calendar with prescription refill dates - keep a calendar with appointment dates   Barriers: Health Behaviors  Why is this important?   Part of staying healthy is seeing the doctor for follow-up care.  If you forget your appointments, there are some things you can do to stay on track.    Notes:  [id number] Patient has kept all HD appointments. He has received his COVID and flu vaccine

## 2021-03-27 NOTE — Patient Outreach (Signed)
Jeffrey [name]) Care Management  Jeffrey [name]  03/27/2021   Jeffrey [name] March 07, 1946 [id number]  Jeffrey [name] BSN RN [name] [phone]   RN Health Coach telephone call to patient.  [employer] compliance verified. RN spoke with patient Jeffrey [name]. Patient is resting on days between his dialysis M-W-F. His fasting blood sugar is 94. His A1C is 6.0. Patient has recent  fall at home and shad skin tears on his arm s and bruises on his knees. Patient uses a walker. Patient some have some swelling in his lower extremities. He does not wear compression hose. Patient and wife have agreed to further outreach calls.   Encounter Medications:  Outpatient Encounter Medications as of 03/27/2021  Medication Sig Note   Ascorbic Acid (VITAMIN C) 1000 MG tablet Take 1,000 mg by mouth in the morning.    calcium carbonate (TUMS - DOSED IN MG ELEMENTAL CALCIUM) 500 MG chewable tablet Chew 1 tablet by mouth 3 (three) times daily with meals.    Calcium Carbonate-Simethicone (ALKA-SELTZER HEARTBURN + GAS) 750-80 MG CHEW Chew 1-2 tablets by mouth 2 (two) times daily as needed (acid reflux).    diltiazem (TIAZAC) 120 MG 24 hr capsule Take 120 mg by mouth daily.    furosemide (LASIX) 40 MG tablet Take 80 mg by mouth daily.    acetaminophen (TYLENOL) 650 MG CR tablet Take 650 mg by mouth every 8 (eight) hours as needed for pain.    Alcohol Swabs (ALCOHOL PREP) 70 % PADS Apply 1 each topically 3 (three) times daily.    ALPRAZolam (XANAX) 0.5 MG tablet Take 0.5 mg by mouth See admin instructions. Take one tablet (0.5 mg) by mouth on Monday, Wednesday, Friday before dialysis, may also take one tablet (0.5 mg) daily as needed for anxiety.    gabapentin (NEURONTIN) 100 MG capsule TAKE 1 CAPSULE BY MOUTH AT BEDTIME (Patient taking differently: Take 100 mg by mouth daily with supper.)    GLUCAGEN HYPOKIT 1 MG SOLR injection Inject 1 mg into the muscle once as needed for low  blood sugar.    hydrocortisone (ANUSOL-HC) 2.5 % rectal cream Place 1 application rectally 2 (two) times daily. (Patient taking differently: Place 1 application rectally 2 (two) times daily as needed for hemorrhoids or anal itching.)    hydrOXYzine (ATARAX/VISTARIL) 25 MG tablet Take 25 mg by mouth 3 (three) times daily. For itching    insulin degludec (TRESIBA FLEXTOUCH) 200 UNIT/ML FlexTouch Pen Inject 120 Units into the skin at bedtime. When blood sugars are up 01/17/2021: 1/2 dose Wednesday night   insulin lispro (HUMALOG) 100 UNIT/ML KwikPen Inject 10-50 Units into the skin 3 (three) times daily with meals. Per sliding scale based on CBG    iron polysaccharides (NIFEREX) 150 MG capsule Take 150 mg by mouth daily with supper. 01/14/2021: Last dose was on 01/08/2021. On hold for procedure.   lactulose (CONSTULOSE) 10 GM/15ML solution TAKE 45 MLS EVERY MORNING AND TAKE 45 MLS EVERY EVENING (Patient taking differently: Take 30 g by mouth See admin instructions. Take 45 mls (30 g) by mouth on Sunday, Tuesday, Thursday, Saturday mornings (non-dialysis days) unless going to MD appointment; take 45 mls (30 g) daily at bedtime)    Lancet Devices (ADJUSTABLE LANCING DEVICE) MISC Apply 1 each topically daily.    levofloxacin (LEVAQUIN) 500 MG tablet TAKE 1 TABLET BY MOUTH THREE TIMES EVERY WEEK AFTER DIALYSIS    metoprolol succinate (TOPROL-XL) 25 MG 24 hr tablet Take 1 tablet (25  mg total) by mouth daily. Take at bedtime  on Monday ,Wednesday and Friday due to dialaylisis (Patient taking differently: Take 25 mg by mouth See admin instructions. Take one tablet (25 mg) by mouth at bedtime every night and take one tablet (25 mg) in morning on  Sun, Tues, Thurs and Sat (non-dialysis days))    midodrine (PROAMATINE) 10 MG tablet Take 1 tablet (10 mg total) by mouth 2 (two) times daily with a meal.    milk thistle 175 MG tablet Take 175 mg by mouth 3 (three) times daily.     mirtazapine (REMERON) 30 MG tablet Take 1  tablet (30 mg total) by mouth at bedtime. (Patient taking differently: Take 30 mg by mouth daily with supper.)    Multiple Vitamin (MULTIVITAMIN WITH MINERALS) TABS tablet Take 1 tablet by mouth in the morning.     NOVOFINE 32G X 6 MM MISC Inject into the skin.    nystatin (MYCOSTATIN/NYSTOP) powder Apply topically 3 (three) times daily. Into groin area and affected area (Patient taking differently: Apply 1 application topically as needed. Into groin area and affected area)    omega-3 acid ethyl esters (LOVAZA) 1 g capsule Take 2 g by mouth 2 (two) times daily.     ondansetron (ZOFRAN) 4 MG tablet Take 4 mg by mouth every 8 (eight) hours as needed for nausea or vomiting.    oxyCODONE-acetaminophen (PERCOCET/ROXICET) 5-325 MG tablet Take 1 tablet by mouth every 4 (four) hours as needed for moderate pain.    pantoprazole (PROTONIX) 40 MG tablet Take 2 tablets (80 mg total) by mouth daily.    Ped Vitamins ACD Fl-Iron (TRI-VIT/FLUORIDE/IRON PO) Take 1 tablet by mouth daily.    polyethylene glycol-electrolytes (TRILYTE) 420 g solution Take 4,000 mLs by mouth as directed.    pravastatin (PRAVACHOL) 20 MG tablet Take 20 mg by mouth in the morning.     Probiotic Product (PROBIOTIC DAILY PO) Take 1 tablet by mouth in the morning.     spironolactone (ALDACTONE) 50 MG tablet Take 50 mg by mouth in the morning.     thiamine (VITAMIN B-1) 100 MG tablet Take 100 mg by mouth daily.    tiZANidine (ZANAFLEX) 2 MG tablet Take 2 mg by mouth 2 (two) times daily as needed for muscle spasms.     triamcinolone cream (KENALOG) 0.1 % Apply 1 application topically 2 (two) times daily as needed (dry skin Itching).     venlafaxine (EFFEXOR) 75 MG tablet Take 75 mg by mouth 3 (three) times daily with meals.     XIFAXAN 550 MG TABS tablet TAKE 1 TABLET BY MOUTH TWICE DAILY    No facility-administered encounter medications on file as of 03/27/2021.    Functional Status:  In your present state of health, do you have any  difficulty performing the following activities: 01/17/2021 01/14/2021  Hearing? Jeffrey [name]  Vision? N N  Difficulty concentrating or making decisions? N Y  Walking or climbing stairs? Y Y  Dressing or bathing? N Y  Doing errands, shopping? N N  Some recent data might be hidden    Fall/Depression Screening: Fall Risk  03/27/2021 02/13/2020 01/09/2020  Falls in the past year? 1 1 1   Number falls in past yr: 1 1 1   Injury with Fall? 1 0 0  Comment skin tears on arms and bruises on knees - -  Risk for fall due to : History of fall(s);Impaired balance/gait;Impaired mobility Impaired balance/gait;Impaired mobility Impaired balance/gait;History of fall(s)  Follow up  Falls evaluation completed;Education provided;Falls prevention discussed Falls prevention discussed Education provided   [name] 2/9 Scores 03/27/2021 01/09/2020 06/09/2018 03/10/2018 12/22/2017 10/20/2017  PHQ - 2 Score 0 0 0 0 0 0    Assessment:   Care Plan Care Plan : Diabetes Type 2 (Adult)  Updates made by Verlin Grills, RN since 03/27/2021 12:00 AM     Problem: Glycemic Management (Diabetes, Type 2)   Priority: Medium     Long-Range Goal: Glycemic Management Optimized   Start Date: 05/30/2022  Expected End Date: 05/30/2022  This Visit's Progress: On track  Recent Progress: On track  Priority: Medium  Note:   Evidence-based guidance:  Anticipate A1C testing (point-of-care) every 3 to 6 months based on goal attainment.  Review mutually-set A1C goal or target range.  Anticipate use of antihyperglycemic with or without insulin and periodic adjustments; consider active involvement of pharmacist.  Provide medical nutrition therapy and development of individualized eating.  Compare self-reported symptoms of hypo or hyperglycemia to blood glucose levels, diet and fluid intake, current medications, psychosocial and physiologic stressors, change in activity and barriers to care adherence.  Promote self-monitoring of blood glucose  levels.  Assess and address barriers to management plan, such as food insecurity, age, developmental ability, depression, anxiety, fear of hypoglycemia or weight gain, as well as medication cost, side effects and complicated regimen.  Consider referral to community-based diabetes education program, visiting nurse, community health worker or health coach.  Encourage regular dental care for treatment of periodontal disease; refer to dental provider when needed.   Notes:  [id number] Patient A1C is 5.9. Patient has a free style Libre and monitoring blood sugars frequently. RN notified Dr office of patient wanting to continue physical therapy    Problem: Disease Progression (Diabetes, Type 2)   Priority: Medium     Goal: Disease Progression Prevented or Minimized   Start Date: 06/20/2020  Expected End Date: 07/01/2021  This Visit's Progress: On track  Recent Progress: On track  Priority: Medium  Note:   Evidence-based guidance:  Prepare patient for laboratory and diagnostic exams based on risk and presentation.  Encourage lifestyle changes, such as increased intake of plant-based foods, stress reduction, consistent physical activity and smoking cessation to prevent long-term complications and chronic disease.   Individualize activity and exercise recommendations while considering potential limitations, such as neuropathy, retinopathy or the ability to prevent hyperglycemia or hypoglycemia.   Prepare patient for use of pharmacologic therapy that may include antihypertensive, analgesic, prostaglandin E1 with periodic adjustments, based on presenting chronic condition and laboratory results.  Assess signs/symptoms and risk factors for hypertension, sleep-disordered breathing, neuropathy (including changes in gait and balance), retinopathy, nephropathy and sexual dysfunction.  Address pregnancy planning and contraceptive choice, especially when prescribing antihypertensive or statin.  Ensure completion  of annual comprehensive foot exam and dilated eye exam.   Implement additional individualized goals and interventions based on identified risk factors.  Prepare patient for consultation or referral for specialist care, such as ophthalmology, neurology, cardiology, podiatry, nephrology or perinatology.   Notes:  [id number] Patient has received flu and COVID vaccine    Task: Monitor and Manage Follow-up for Comorbidities   Due Date: 05/30/2022  Priority: Routine  Note:   Care Management Activities:    - completion of annual dilated eye exam confirmed - completion of annual foot exam verified - healthy lifestyle promoted - reduction of sedentary activity encouraged - signs/symptoms of comorbidities identified - vital signs and trends reviewed    Notes:  10/20-Pt  continue to manage all other comorbidities with no encountered issues. States his recent weights are controlled with dialysis maintaining weights around 204 lbs with no symptoms of HF. A1C and CBG are also improved. 9/22-Spoke with the pt today and received an update on pt's ongoing management of care. Pt reports no additional GI bleed symptoms as he continue to manage this condition with follow up appointments and following all recommendations to prevent acute bleed once again. Verified pt is managing all his comorbidities to avoid returning to the hospital. 8/23-Spoke with caregiver spouse today Jeffrey [name] who indicates recent hospitalization due to GI bleed. States it was a result of a colonoscopy that continue to bleed until cauterized. Pt feeling much better and will resume his regular DH scheduled starting tomorrow. No other issues or needs at this time.   7/28-Pt continue to manage his ongoing medical issues with assistance from his wife. No acute issues from any other medical condition reported at this time. Pt tolerating all therapy and recommended treatments at this time. 5/23-Spoke with spouse today primary caregiver who indicates  pt is tolerating all therapeutic treatments along with his weekly dialysis. Controlling all comorbidities with assistance from [employer] diabetic device now not being covered vis insurance carrier. Offered to assist if further needs arise with [facility]. 4/19- Pt continues to be adherent with maintain his therapies and attendance to all his medical appointments in managing his comorbidities and spouse has denied any signs or symptoms related. Will continue to offer other disciplines via [facility] services (none needed at this time).  3/22- HD 3 X weekly and attendance to all appointments. [name] today for fluid retention removal. Spouse ([name]) feel pt may benefit from additional HHPT. Encouraged to make this request with [name] (receptive and she will inquire). No falls uses WR. Reports AM glucose at 140 and maintaining this average. Will continue to encouraged adherence and praised pt for his efforts in maintaining his health and adhering to all goals and interventions.  2/22-Pt continues to do well with no sign or symptoms related. Pt continue to attempt healthy eating habits similar to the Keto plan. Pt not able to exercise much however encouraged as tolerated. 1/20-Spouse reports several appointments were rescheduled due to incremental weather but verified. Pt missed two dialysis appointment however was able to re-stabilize fluid removed via dialysis on another day. Stress the importance of attending dialysis to avoid increase risk factors that may lead to hospitalizations.      Goals Addressed             This Visit's Progress    THN-Disease Progression Prevented or Minimized   On track    Follow up date: 07/01/2021 Quarterly follow ups Timeframe:  Short-Term Goal Priority:  Medium Start Date:     07/23/2020                        Expected End Date:  [id number]                      Evidence-based guidance:   Prepare patient for laboratory tests, such as lipid  profile, microalbuminuria, estimated glomerular filtration rate and diagnostic exams based on risk and presentation.  Assess signs/symptoms and risk factors for hypertension, dyslipidemia, nephropathy, neuropathy and retinopathy.  Encourage lifestyle changes, such as increased intake of plant-based foods, stress reduction, regular physical activity and smoking cessation to prevent long-term complications and chronic diseases.  Discourage sedentary behavior  or sitting longer than 30 minutes without interruption; individualize exercise or activity recommendations with potential limitations.    Implement additional individualized goals and interventions based on identified risk factors.  Barriers: Health Behaviors  Notes: [id number] Patient FBS is 94. His A1C is 5.9 Patient rests on the days between his dialysis.      THN-Make and Keep All Appointments   On track    Follow Up Date : 07/01/2021 Quarterly follow up Timeframe:  Long-Range Goal Priority:  Medium Start Date:     05/23/2020                        Expected End Date:  [id number]              - call to cancel if needed - keep a calendar with prescription refill dates - keep a calendar with appointment dates   Barriers: Health Behaviors  Why is this important?   Part of staying healthy is seeing the doctor for follow-up care.  If you forget your appointments, there are some things you can do to stay on track.    Notes:  [id number] Patient has kept all HD appointments. He has received his COVID and flu vaccine        Plan:  Follow-up within the month of February RN sent educational material on Falls and How to get up from a fall RN sent educational material on skin tears RN notified Dr of request for physical therapy continuation RN sent How to thrive: A guide for your journey with diabetes booklet RN sent update assessment to PCP  [facility] [phone]

## 2021-04-01 ENCOUNTER — Other Ambulatory Visit: Payer: Self-pay | Admitting: *Deleted

## 2021-04-01 DIAGNOSIS — R188 Other ascites: Secondary | ICD-10-CM

## 2021-04-02 ENCOUNTER — Encounter: Payer: Self-pay | Admitting: Internal Medicine

## 2021-04-03 ENCOUNTER — Ambulatory Visit (INDEPENDENT_AMBULATORY_CARE_PROVIDER_SITE_OTHER): Payer: Medicare Other

## 2021-04-03 ENCOUNTER — Other Ambulatory Visit: Payer: Self-pay

## 2021-04-03 DIAGNOSIS — R339 Retention of urine, unspecified: Secondary | ICD-10-CM

## 2021-04-03 NOTE — Progress Notes (Signed)
Cath Change/ Replacement  Patient is present today for a catheter change due to urinary retention.  61ml of water was removed from the balloon, a 16FR foley cath was removed with out difficulty.  Patient was cleaned and prepped in a sterile fashion with betadine. A 16 FR foley cath was replaced into the bladder no complications were noted Urine return was noted 73ml and urine was yellow in color. The balloon was filled with 50ml of sterile water. A leg bag was attached for drainage. Patient was given proper instruction on catheter care.    Performed by: Gladyes Kudo LPN  Follow up: Keep next scheduled NV

## 2021-04-03 NOTE — Patient Instructions (Signed)

## 2021-04-04 ENCOUNTER — Ambulatory Visit (HOSPITAL_COMMUNITY): Payer: Medicare Other

## 2021-04-07 ENCOUNTER — Ambulatory Visit (HOSPITAL_COMMUNITY): Payer: Medicare Other

## 2021-04-07 ENCOUNTER — Other Ambulatory Visit (HOSPITAL_COMMUNITY): Payer: Medicare Other

## 2021-04-08 ENCOUNTER — Inpatient Hospital Stay (HOSPITAL_COMMUNITY): Payer: Medicare Other | Attending: Hematology

## 2021-04-08 ENCOUNTER — Inpatient Hospital Stay (HOSPITAL_COMMUNITY): Payer: Medicare Other | Attending: Physician Assistant

## 2021-04-08 VITALS — BP 149/64 | HR 75

## 2021-04-08 DIAGNOSIS — E538 Deficiency of other specified B group vitamins: Secondary | ICD-10-CM

## 2021-04-08 DIAGNOSIS — D61818 Other pancytopenia: Secondary | ICD-10-CM

## 2021-04-08 DIAGNOSIS — K652 Spontaneous bacterial peritonitis: Secondary | ICD-10-CM | POA: Diagnosis not present

## 2021-04-08 DIAGNOSIS — D500 Iron deficiency anemia secondary to blood loss (chronic): Secondary | ICD-10-CM

## 2021-04-08 LAB — CBC WITH DIFFERENTIAL/PLATELET
Abs Immature Granulocytes: 0.01 10*3/uL (ref 0.00–0.07)
Basophils Absolute: 0 10*3/uL (ref 0.0–0.1)
Basophils Relative: 1 %
Eosinophils Absolute: 0.1 10*3/uL (ref 0.0–0.5)
Eosinophils Relative: 3 %
HCT: 28.7 % — ABNORMAL LOW (ref 39.0–52.0)
Hemoglobin: 9 g/dL — ABNORMAL LOW (ref 13.0–17.0)
Immature Granulocytes: 0 %
Lymphocytes Relative: 6 %
Lymphs Abs: 0.2 10*3/uL — ABNORMAL LOW (ref 0.7–4.0)
MCH: 33.2 pg (ref 26.0–34.0)
MCHC: 31.4 g/dL (ref 30.0–36.0)
MCV: 105.9 fL — ABNORMAL HIGH (ref 80.0–100.0)
Monocytes Absolute: 0.2 10*3/uL (ref 0.1–1.0)
Monocytes Relative: 5 %
Neutro Abs: 3.1 10*3/uL (ref 1.7–7.7)
Neutrophils Relative %: 85 %
Platelets: 82 10*3/uL — ABNORMAL LOW (ref 150–400)
RBC: 2.71 MIL/uL — ABNORMAL LOW (ref 4.22–5.81)
RDW: 17.2 % — ABNORMAL HIGH (ref 11.5–15.5)
Smear Review: DECREASED
WBC: 3.7 10*3/uL — ABNORMAL LOW (ref 4.0–10.5)
nRBC: 0 % (ref 0.0–0.2)

## 2021-04-08 LAB — IRON AND TIBC
Iron: 79 ug/dL (ref 45–182)
Saturation Ratios: 31 % (ref 17.9–39.5)
TIBC: 252 ug/dL (ref 250–450)
UIBC: 173 ug/dL

## 2021-04-08 LAB — VITAMIN B12: Vitamin B-12: >7500 pg/mL — ABNORMAL HIGH (ref 180–914)

## 2021-04-08 LAB — FERRITIN: Ferritin: 352 ng/mL — ABNORMAL HIGH (ref 24–336)

## 2021-04-08 LAB — SAMPLE TO BLOOD BANK

## 2021-04-08 MED ORDER — CYANOCOBALAMIN 1000 MCG/ML IJ SOLN
1000.0000 ug | Freq: Once | INTRAMUSCULAR | Status: AC
  Administered 2021-04-08: 1000 ug via INTRAMUSCULAR
  Filled 2021-04-08: qty 1

## 2021-04-08 NOTE — Progress Notes (Signed)
Xgeva injection given per orders. Patient tolerated it well without problems. Vitals stable and discharged home from clinic ambulatory. Follow up as scheduled.  

## 2021-04-08 NOTE — Patient Instructions (Signed)
[facility]  Discharge Instructions: Thank you for choosing [facility] to provide your oncology and hematology care.  If you have a lab appointment with the Cancer Center, please come in thru the Main Entrance and check in at the main information desk.  Wear comfortable clothing and clothing appropriate for easy access to any Portacath or PICC line.   We strive to give you quality time with your provider. You may need to reschedule your appointment if you arrive late (15 or more minutes).  Arriving late affects you and other patients whose appointments are after yours.  Also, if you miss three or more appointments without notifying the office, you may be dismissed from the clinic at the provider's discretion.      For prescription refill requests, have your pharmacy contact our office and allow 72 hours for refills to be completed.        To help prevent nausea and vomiting after your treatment, we encourage you to take your nausea medication as directed.  BELOW ARE SYMPTOMS THAT SHOULD BE REPORTED IMMEDIATELY: *FEVER GREATER THAN 100.4 F (38 C) OR HIGHER *CHILLS OR SWEATING *NAUSEA AND VOMITING THAT IS NOT CONTROLLED WITH YOUR NAUSEA MEDICATION *UNUSUAL SHORTNESS OF BREATH *UNUSUAL BRUISING OR BLEEDING *URINARY PROBLEMS (pain or burning when urinating, or frequent urination) *BOWEL PROBLEMS (unusual diarrhea, constipation, pain near the anus) TENDERNESS IN MOUTH AND THROAT WITH OR WITHOUT PRESENCE OF ULCERS (sore throat, sores in mouth, or a toothache) UNUSUAL RASH, SWELLING OR PAIN  UNUSUAL VAGINAL DISCHARGE OR ITCHING   Items with * indicate a potential emergency and should be followed up as soon as possible or go to the Emergency Department if any problems should occur.  Please show the CHEMOTHERAPY ALERT CARD or IMMUNOTHERAPY ALERT CARD at check-in to the Emergency Department and triage nurse.  Should you have questions after your visit or need to cancel  or reschedule your appointment, please contact  [facility] [phone]  and follow the prompts.  Office hours are 8:00 a.m. to 4:30 p.m. Monday - Friday. Please note that voicemails left after 4:00 p.m. may not be returned until the following business day.  We are closed weekends and major holidays. You have access to a nurse at all times for urgent questions. Please call the main number to the clinic [phone] and follow the prompts.  For any non-urgent questions, you may also contact your provider using MyChart. We now offer e-Visits for anyone 18 and older to request care online for non-urgent symptoms. For details visit [email or web address].   Also download the MyChart app! Go to the app store, search "MyChart", open the app, select [facility], and log in with your MyChart username and password.  Due to Covid, a mask is required upon entering the hospital/clinic. If you do not have a mask, one will be given to you upon arrival. For doctor visits, patients may have 1 support person aged 18 or older with them. For treatment visits, patients cannot have anyone with them due to current Covid guidelines and our immunocompromised population.  

## 2021-04-10 ENCOUNTER — Encounter (HOSPITAL_COMMUNITY): Payer: Self-pay

## 2021-04-10 ENCOUNTER — Inpatient Hospital Stay (HOSPITAL_COMMUNITY)
Admission: EM | Admit: 2021-04-10 | Discharge: 2021-04-15 | DRG: 371 | Disposition: A | Discharge status: Home / Self Care | Payer: Medicare Other | Attending: Family Medicine | Admitting: Family Medicine

## 2021-04-10 ENCOUNTER — Other Ambulatory Visit: Payer: Self-pay

## 2021-04-10 ENCOUNTER — Encounter (HOSPITAL_COMMUNITY): Payer: Self-pay | Admitting: *Deleted

## 2021-04-10 ENCOUNTER — Ambulatory Visit (HOSPITAL_COMMUNITY)
Admission: RE | Admit: 2021-04-10 | Discharge: 2021-04-10 | Disposition: A | Discharge status: Home / Self Care | Payer: Medicare Other | Source: Ambulatory Visit | Attending: Gastroenterology | Admitting: Gastroenterology

## 2021-04-10 ENCOUNTER — Telehealth: Payer: Self-pay | Admitting: *Deleted

## 2021-04-10 DIAGNOSIS — Z20822 Contact with and (suspected) exposure to covid-19: Secondary | ICD-10-CM | POA: Diagnosis present

## 2021-04-10 DIAGNOSIS — Z794 Long term (current) use of insulin: Secondary | ICD-10-CM | POA: Diagnosis not present

## 2021-04-10 DIAGNOSIS — Z803 Family history of malignant neoplasm of breast: Secondary | ICD-10-CM

## 2021-04-10 DIAGNOSIS — E782 Mixed hyperlipidemia: Secondary | ICD-10-CM | POA: Diagnosis present

## 2021-04-10 DIAGNOSIS — I120 Hypertensive chronic kidney disease with stage 5 chronic kidney disease or end stage renal disease: Secondary | ICD-10-CM | POA: Diagnosis present

## 2021-04-10 DIAGNOSIS — E1165 Type 2 diabetes mellitus with hyperglycemia: Secondary | ICD-10-CM | POA: Diagnosis present

## 2021-04-10 DIAGNOSIS — K625 Hemorrhage of anus and rectum: Secondary | ICD-10-CM | POA: Diagnosis not present

## 2021-04-10 DIAGNOSIS — E538 Deficiency of other specified B group vitamins: Secondary | ICD-10-CM | POA: Diagnosis present

## 2021-04-10 DIAGNOSIS — Z79899 Other long term (current) drug therapy: Secondary | ICD-10-CM | POA: Diagnosis not present

## 2021-04-10 DIAGNOSIS — I8510 Secondary esophageal varices without bleeding: Secondary | ICD-10-CM | POA: Diagnosis present

## 2021-04-10 DIAGNOSIS — F32A Depression, unspecified: Secondary | ICD-10-CM | POA: Diagnosis present

## 2021-04-10 DIAGNOSIS — Z992 Dependence on renal dialysis: Secondary | ICD-10-CM

## 2021-04-10 DIAGNOSIS — K652 Spontaneous bacterial peritonitis: Principal | ICD-10-CM | POA: Diagnosis present

## 2021-04-10 DIAGNOSIS — E871 Hypo-osmolality and hyponatremia: Secondary | ICD-10-CM | POA: Diagnosis present

## 2021-04-10 DIAGNOSIS — E8809 Other disorders of plasma-protein metabolism, not elsewhere classified: Secondary | ICD-10-CM | POA: Diagnosis present

## 2021-04-10 DIAGNOSIS — Z833 Family history of diabetes mellitus: Secondary | ICD-10-CM

## 2021-04-10 DIAGNOSIS — E11649 Type 2 diabetes mellitus with hypoglycemia without coma: Secondary | ICD-10-CM | POA: Diagnosis present

## 2021-04-10 DIAGNOSIS — F1011 Alcohol abuse, in remission: Secondary | ICD-10-CM | POA: Diagnosis present

## 2021-04-10 DIAGNOSIS — D631 Anemia in chronic kidney disease: Secondary | ICD-10-CM | POA: Diagnosis present

## 2021-04-10 DIAGNOSIS — K7031 Alcoholic cirrhosis of liver with ascites: Secondary | ICD-10-CM | POA: Diagnosis present

## 2021-04-10 DIAGNOSIS — Z87891 Personal history of nicotine dependence: Secondary | ICD-10-CM

## 2021-04-10 DIAGNOSIS — E1122 Type 2 diabetes mellitus with diabetic chronic kidney disease: Secondary | ICD-10-CM

## 2021-04-10 DIAGNOSIS — E876 Hypokalemia: Secondary | ICD-10-CM | POA: Diagnosis present

## 2021-04-10 DIAGNOSIS — R188 Other ascites: Secondary | ICD-10-CM | POA: Insufficient documentation

## 2021-04-10 DIAGNOSIS — E119 Type 2 diabetes mellitus without complications: Secondary | ICD-10-CM

## 2021-04-10 DIAGNOSIS — K219 Gastro-esophageal reflux disease without esophagitis: Secondary | ICD-10-CM | POA: Diagnosis present

## 2021-04-10 DIAGNOSIS — Z823 Family history of stroke: Secondary | ICD-10-CM

## 2021-04-10 DIAGNOSIS — N2581 Secondary hyperparathyroidism of renal origin: Secondary | ICD-10-CM | POA: Diagnosis present

## 2021-04-10 DIAGNOSIS — Z800 Family history of malignant neoplasm of digestive organs: Secondary | ICD-10-CM

## 2021-04-10 DIAGNOSIS — F419 Anxiety disorder, unspecified: Secondary | ICD-10-CM | POA: Diagnosis present

## 2021-04-10 DIAGNOSIS — K7030 Alcoholic cirrhosis of liver without ascites: Secondary | ICD-10-CM | POA: Diagnosis present

## 2021-04-10 DIAGNOSIS — J329 Chronic sinusitis, unspecified: Secondary | ICD-10-CM

## 2021-04-10 DIAGNOSIS — N186 End stage renal disease: Secondary | ICD-10-CM | POA: Diagnosis present

## 2021-04-10 DIAGNOSIS — I4891 Unspecified atrial fibrillation: Secondary | ICD-10-CM | POA: Diagnosis present

## 2021-04-10 DIAGNOSIS — E8770 Fluid overload, unspecified: Secondary | ICD-10-CM | POA: Diagnosis present

## 2021-04-10 LAB — COMPREHENSIVE METABOLIC PANEL
ALT: 16 U/L (ref 0–44)
AST: 20 U/L (ref 15–41)
Albumin: 2.9 g/dL — ABNORMAL LOW (ref 3.5–5.0)
Alkaline Phosphatase: 227 U/L — ABNORMAL HIGH (ref 38–126)
Anion gap: 11 (ref 5–15)
BUN: 30 mg/dL — ABNORMAL HIGH (ref 8–23)
CO2: 27 mmol/L (ref 22–32)
Calcium: 8.4 mg/dL — ABNORMAL LOW (ref 8.9–10.3)
Chloride: 91 mmol/L — ABNORMAL LOW (ref 98–111)
Creatinine, Ser: 2.89 mg/dL — ABNORMAL HIGH (ref 0.61–1.24)
GFR, Estimated: 22 mL/min — ABNORMAL LOW (ref >60)
Glucose, Bld: 216 mg/dL — ABNORMAL HIGH (ref 70–99)
Potassium: 3.3 mmol/L — ABNORMAL LOW (ref 3.5–5.1)
Sodium: 129 mmol/L — ABNORMAL LOW (ref 135–145)
Total Bilirubin: 0.7 mg/dL (ref 0.3–1.2)
Total Protein: 7 g/dL (ref 6.5–8.1)

## 2021-04-10 LAB — CBC WITH DIFFERENTIAL/PLATELET
Abs Immature Granulocytes: 0.01 10*3/uL (ref 0.00–0.07)
Basophils Absolute: 0 10*3/uL (ref 0.0–0.1)
Basophils Relative: 1 %
Eosinophils Absolute: 0.2 10*3/uL (ref 0.0–0.5)
Eosinophils Relative: 7 %
HCT: 28 % — ABNORMAL LOW (ref 39.0–52.0)
Hemoglobin: 8.8 g/dL — ABNORMAL LOW (ref 13.0–17.0)
Immature Granulocytes: 0 %
Lymphocytes Relative: 10 %
Lymphs Abs: 0.3 10*3/uL — ABNORMAL LOW (ref 0.7–4.0)
MCH: 33.1 pg (ref 26.0–34.0)
MCHC: 31.4 g/dL (ref 30.0–36.0)
MCV: 105.3 fL — ABNORMAL HIGH (ref 80.0–100.0)
Monocytes Absolute: 0.3 10*3/uL (ref 0.1–1.0)
Monocytes Relative: 11 %
Neutro Abs: 2.2 10*3/uL (ref 1.7–7.7)
Neutrophils Relative %: 71 %
Platelets: 95 10*3/uL — ABNORMAL LOW (ref 150–400)
RBC: 2.66 MIL/uL — ABNORMAL LOW (ref 4.22–5.81)
RDW: 17.1 % — ABNORMAL HIGH (ref 11.5–15.5)
WBC: 3.1 10*3/uL — ABNORMAL LOW (ref 4.0–10.5)
nRBC: 0 % (ref 0.0–0.2)

## 2021-04-10 LAB — BODY FLUID CELL COUNT WITH DIFFERENTIAL
Eos, Fluid: 3 %
Lymphs, Fluid: 49 %
Monocyte-Macrophage-Serous Fluid: 39 % — ABNORMAL LOW (ref 50–90)
Neutrophil Count, Fluid: 9 % (ref 0–25)
Total Nucleated Cell Count, Fluid: 198 cu mm (ref 0–1000)

## 2021-04-10 LAB — RESP PANEL BY RT-PCR (FLU A&B, COVID) ARPGX2
Influenza A by PCR: NEGATIVE
Influenza B by PCR: NEGATIVE
SARS Coronavirus 2 by RT PCR: NEGATIVE

## 2021-04-10 LAB — GLUCOSE, CAPILLARY: Glucose-Capillary: 170 mg/dL — ABNORMAL HIGH (ref 70–99)

## 2021-04-10 LAB — LACTIC ACID, PLASMA: Lactic Acid, Venous: 1.7 mmol/L (ref 0.5–1.9)

## 2021-04-10 MED ORDER — PIPERACILLIN-TAZOBACTAM IN DEX 2-0.25 GM/50ML IV SOLN
2.2500 g | Freq: Once | INTRAVENOUS | Status: AC
  Administered 2021-04-10: 2.25 g via INTRAVENOUS
  Filled 2021-04-10 (×2): qty 50

## 2021-04-10 MED ORDER — SPIRONOLACTONE 25 MG PO TABS
50.0000 mg | ORAL_TABLET | Freq: Every morning | ORAL | Status: DC
  Administered 2021-04-11 – 2021-04-15 (×5): 50 mg via ORAL
  Filled 2021-04-10 (×5): qty 2

## 2021-04-10 MED ORDER — OXYCODONE HCL 5 MG PO TABS: 5.0000 mg | ORAL_TABLET | ORAL | Status: DC | PRN

## 2021-04-10 MED ORDER — INSULIN DETEMIR 100 UNIT/ML ~~LOC~~ SOLN
40.0000 [IU] | Freq: Two times a day (BID) | SUBCUTANEOUS | Status: DC
  Administered 2021-04-11 (×2): 40 [IU] via SUBCUTANEOUS
  Filled 2021-04-10 (×8): qty 0.4

## 2021-04-10 MED ORDER — OXYCODONE-ACETAMINOPHEN 5-325 MG PO TABS
1.0000 | ORAL_TABLET | ORAL | Status: DC | PRN
  Administered 2021-04-11: 1 via ORAL
  Filled 2021-04-10: qty 1

## 2021-04-10 MED ORDER — PIPERACILLIN-TAZOBACTAM 3.375 G IVPB
3.3750 g | Freq: Three times a day (TID) | INTRAVENOUS | Status: DC
  Administered 2021-04-11: 3.375 g via INTRAVENOUS
  Filled 2021-04-10: qty 50

## 2021-04-10 MED ORDER — MORPHINE SULFATE (PF) 2 MG/ML IV SOLN: 2.0000 mg | INTRAVENOUS | Status: DC | PRN

## 2021-04-10 MED ORDER — HYDROXYZINE HCL 25 MG PO TABS
25.0000 mg | ORAL_TABLET | Freq: Three times a day (TID) | ORAL | Status: DC
  Administered 2021-04-11 – 2021-04-15 (×13): 25 mg via ORAL
  Filled 2021-04-10 (×13): qty 1

## 2021-04-10 MED ORDER — ACETAMINOPHEN 325 MG PO TABS: 650.0000 mg | ORAL_TABLET | Freq: Four times a day (QID) | ORAL | Status: DC | PRN

## 2021-04-10 MED ORDER — RIFAXIMIN 550 MG PO TABS
550.0000 mg | ORAL_TABLET | Freq: Two times a day (BID) | ORAL | Status: DC
  Administered 2021-04-11 – 2021-04-15 (×10): 550 mg via ORAL
  Filled 2021-04-10 (×10): qty 1

## 2021-04-10 MED ORDER — ONDANSETRON HCL 4 MG/2ML IJ SOLN: 4.0000 mg | Freq: Four times a day (QID) | INTRAMUSCULAR | Status: DC | PRN

## 2021-04-10 MED ORDER — INSULIN ASPART 100 UNIT/ML IJ SOLN: 0.0000 [IU] | Freq: Three times a day (TID) | INTRAMUSCULAR | Status: DC

## 2021-04-10 MED ORDER — DILTIAZEM HCL ER BEADS 120 MG PO CP24
120.0000 mg | ORAL_CAPSULE | Freq: Every day | ORAL | Status: DC
  Administered 2021-04-11 – 2021-04-15 (×5): 120 mg via ORAL
  Filled 2021-04-10 (×11): qty 1

## 2021-04-10 MED ORDER — MIRTAZAPINE 30 MG PO TABS
30.0000 mg | ORAL_TABLET | Freq: Every day | ORAL | Status: DC
  Administered 2021-04-11 – 2021-04-14 (×4): 30 mg via ORAL
  Filled 2021-04-10 (×4): qty 1

## 2021-04-10 MED ORDER — PANTOPRAZOLE SODIUM 40 MG PO TBEC
80.0000 mg | DELAYED_RELEASE_TABLET | Freq: Every day | ORAL | Status: DC
  Administered 2021-04-11 – 2021-04-15 (×5): 80 mg via ORAL
  Filled 2021-04-10 (×5): qty 2

## 2021-04-10 MED ORDER — LACTULOSE 10 GM/15ML PO SOLN: 45.0000 g | Freq: Every day | ORAL | Status: DC

## 2021-04-10 MED ORDER — ONDANSETRON HCL 4 MG PO TABS
4.0000 mg | ORAL_TABLET | Freq: Four times a day (QID) | ORAL | Status: DC | PRN
  Administered 2021-04-12 – 2021-04-13 (×2): 4 mg via ORAL
  Filled 2021-04-10 (×2): qty 1

## 2021-04-10 MED ORDER — VENLAFAXINE HCL 37.5 MG PO TABS
75.0000 mg | ORAL_TABLET | Freq: Three times a day (TID) | ORAL | Status: DC
  Administered 2021-04-11 – 2021-04-15 (×14): 75 mg via ORAL
  Filled 2021-04-10 (×14): qty 2

## 2021-04-10 MED ORDER — GABAPENTIN 100 MG PO CAPS
100.0000 mg | ORAL_CAPSULE | Freq: Every day | ORAL | Status: DC
  Administered 2021-04-11 – 2021-04-14 (×4): 100 mg via ORAL
  Filled 2021-04-10 (×4): qty 1

## 2021-04-10 MED ORDER — SODIUM CHLORIDE 0.9 % IV SOLN
1.0000 g | Freq: Once | INTRAVENOUS | Status: AC
  Administered 2021-04-10: 1 g via INTRAVENOUS
  Filled 2021-04-10: qty 10

## 2021-04-10 MED ORDER — PRAVASTATIN SODIUM 10 MG PO TABS
20.0000 mg | ORAL_TABLET | Freq: Every morning | ORAL | Status: DC
  Administered 2021-04-11 – 2021-04-15 (×5): 20 mg via ORAL
  Filled 2021-04-10 (×6): qty 2

## 2021-04-10 MED ORDER — ALBUMIN HUMAN 25 % IV SOLN
0.0000 g | Freq: Once | INTRAVENOUS | Status: DC
  Filled 2021-04-10: qty 400

## 2021-04-10 MED ORDER — TIZANIDINE HCL 2 MG PO TABS: 2.0000 mg | ORAL_TABLET | Freq: Two times a day (BID) | ORAL | Status: DC | PRN

## 2021-04-10 MED ORDER — ACETAMINOPHEN 650 MG RE SUPP: 650.0000 mg | Freq: Four times a day (QID) | RECTAL | Status: DC | PRN

## 2021-04-10 MED ORDER — METOPROLOL SUCCINATE ER 25 MG PO TB24
25.0000 mg | ORAL_TABLET | Freq: Every day | ORAL | Status: DC
  Administered 2021-04-11 – 2021-04-14 (×4): 25 mg via ORAL
  Filled 2021-04-10 (×4): qty 1

## 2021-04-10 MED ORDER — INSULIN ASPART 100 UNIT/ML IJ SOLN: 0.0000 [IU] | Freq: Every day | INTRAMUSCULAR | Status: DC

## 2021-04-10 NOTE — Procedures (Signed)
   US guided LLQ paracentesis  2.4 L cloudy yellow fluid  Sent for labs per MD  Tolerated well

## 2021-04-10 NOTE — Progress Notes (Signed)
Pt tolerated left sided paracentesis procedure well today and 2.4 Liters of cloudy yellow fluid removed with labs collected and sent for processing. PT verbalized understanding of discharge instructions and maintained vital signs WNL at completion of procedure. PT left via wheelchair at this time and returned to waiting area where wife was waiting for him with NAD noted.

## 2021-04-10 NOTE — ED Triage Notes (Signed)
Had a paracentesis today and fluid had bacteria in it. Advised to come in for antibiotics

## 2021-04-10 NOTE — ED Provider Notes (Signed)
[facility] EMERGENCY DEPARTMENT Provider Note   CSN: [id number] Arrival date & time: 04/10/21  1728     History No chief complaint on file.   Jeffrey [name] is a 75 y.o. male with a history most significant for alcohol induced cirrhosis, other medical conditions include end-stage renal disease on dialysis Monday Wednesday Friday, diabetes, GERD, history of esophageal varices, hypertension prior histories of spontaneous bacterial peritonitis presenting today for a suspected SBP.  He underwent an ultrasound-guided left lower quadrant therapeutic paracentesis at which time 2.5 L of cloudy yellow fluid was obtained.  The Jeffrey stain was positive for Jeffrey positive rods and patient was advised to come to the emergency department for admission for antibiotics.  Patient denies abdominal pain, nausea, vomiting, fevers or chills.    The history is provided by the patient, the spouse and medical records.      Past Medical History:  Diagnosis Date   A-fib [facility])    when initially stating dialysis january 2021 at [facility] per spouse; never heard anything else about it   Alcoholic cirrhosis [facility])    patient reports completing hep a and b vaccines in 2006   Anemia    has had 3 units of prbcs 2013   Anxiety    Arthritis    Asymptomatic gallstones    Ultrasound in 2006   B12 deficiency 11/21/2020   C. difficile colitis    Cholelithiasis    Chronic kidney disease    Dialysis M/W/F/Sa   Depression    Diabetes ([place])    Duodenal ulcer with hemorrhage    per patient in 2006 or 2007, records have been requested   Dyspnea    low oxygen level -  has O2 2 L all day   Esophageal varices ([place])    see PSH   GERD (gastroesophageal reflux disease)    Heart burn    History of alcohol abuse    quit 05/2013   HOH (hard of hearing)    Hypertension    Liver cirrhosis ([place])    SBP (spontaneous bacterial peritonitis) ([place])    2020, November 2021.    Splenomegaly    Ultrasound in 2006     Patient Active Problem List   Diagnosis Date Noted   Rectal varices    Hyponatremia 01/17/2021   Thrombocytopenia ([place]) 01/17/2021   B12 deficiency 11/21/2020   Iron deficiency anemia due to chronic blood loss 06/25/2020   Gastroesophageal reflux disease 06/06/2020   Bacteremia due to Jeffrey-positive bacteria 04/03/2020   Acute on chronic respiratory failure with hypoxia and hypercapnia (HCC) 04/03/2020   Lobar pneumonia ([place]) 04/03/2020   Volume overload 04/02/2020   Anxiety with depression 04/02/2020   Asymptomatic bacteriuria 04/02/2020   Pain of upper abdomen 02/22/2020   Urine leukocytes increased 02/22/2020   Fall    Minor head injury    Chronic diastolic HF (heart failure) ([place])    Physical deconditioning    Acute hyponatremia 02/19/2020   Fever and chills 02/09/2020   Advanced care planning/counseling discussion    DNR (do not resuscitate)    Alcoholic cirrhosis of liver ([place]) 02/08/2020   Back pain 02/08/2020   Status post thoracentesis    Hepatic encephalopathy 01/01/2020   Bilateral pleural effusion 12/31/2019   Hypoglycemia 12/31/2019   Hyperammonemia ([place]) 12/31/2019   Class 2 obesity 12/31/2019   Hypoalbuminemia 12/31/2019   Nosebleed    Goals of care, counseling/discussion    Palliative care by specialist    Acute hepatic encephalopathy  12/13/2019   Encephalopathy acute    Hyperkalemia    Chronic hyponatremia    Anemia 11/16/2019   Acute blood loss anemia 09/20/2019   GI bleed    Acute on chronic anemia 09/18/2019   Generalized weakness 09/18/2019   Type 2 diabetes mellitus ([place]) 09/18/2019   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[place]) 09/18/2019   Enteritis due to Clostridium difficile 08/31/2019   Edema 14/48/1856   Alcoholic cirrhosis ([place]) 31/49/7026   Type 2 diabetes mellitus with stage 3 chronic kidney disease, with long-term current use of insulin ([place]) 10/20/2017   Essential hypertension, benign 10/20/2017   Morbid obesity ([place]) 10/20/2017    Mixed hyperlipidemia 10/20/2017   Ascites    SBP (spontaneous bacterial peritonitis) ([place]) 01/12/2016   Shigella dysenteriae 01/11/2016   Diarrhea 01/10/2016   Chronic liver disease and cirrhosis ([place]) 37/85/8850   Alcoholic cirrhosis of liver with ascites ([place]) 03/09/2012   Heme positive stool 03/09/2012   Anemia due to chronic blood loss 03/09/2012   Pancytopenia ([place]) 03/09/2012   FH: colon cancer 03/09/2012   Esophageal varices in alcoholic cirrhosis ([place]) 27/74/1287    Past Surgical History:  Procedure Laterality Date   AGILE CAPSULE N/A 09/20/2019   Procedure: AGILE CAPSULE;  Surgeon: Daneil Dolin, MD;  Location: [facility];  Service: Endoscopy;  Laterality: N/A;   APPENDECTOMY     AV FISTULA PLACEMENT Left 08/17/2019   Procedure: ARTERIOVENOUS (AV) FISTULA CREATION VERSES ARTERIOVENOUS GRAFT;  Surgeon: [name], MD;  Location: [facility] OR;  Service: Vascular;  Laterality: Left;   BASCILIC VEIN TRANSPOSITION Left 09/28/2019   Procedure: LEFT SECOND STAGE [place];  Surgeon: [name], MD;  Location: [place];  Service: Vascular;  Laterality: Left;   BIOPSY  02/03/2018   Procedure: BIOPSY;  Surgeon: Daneil Dolin, MD;  Location[facility];  Service: Endoscopy;;  bx of gastric polyps   BIOPSY  09/19/2019   Procedure: BIOPSY;  Surgeon: Danie Binder, MD;  [facility];  Service: Endoscopy;;   BIOPSY  01/16/2021   Procedure: BIOPSY;  Surgeon: Daneil Dolin, MD;  [facility];  Service: Endoscopy;;   CATARACT EXTRACTION Right    COLONOSCOPY  03/10/2005   Rectal polyp as described above, removed with snare. Left sided  diverticula. The remainder of the colonic mucosa appeared normal. Inflammed polyp on path.   COLONOSCOPY  04/1999   Dr. Thea Silversmith polyps removed,  Path showed hyperplastic   COLONOSCOPY  10/2015   Dr. Britta Mccreedy: diverticulosis, single sessile tubular adenoma 3-35mm in size removed from descending colon.     COLONOSCOPY WITH ESOPHAGOGASTRODUODENOSCOPY (EGD)  03/31/2012   OMV:EHMCNOB AVMs. Colonic diverticulosis. tubular adenoma colon   COLONOSCOPY WITH ESOPHAGOGASTRODUODENOSCOPY (EGD)  06/2019   FORSYTH: small esophageal varices without high risk stigmata, single large AVM on lesser curvature in gastric body s/p APC ablation, gastric antral and duodenal bulb polyposis. No significant source to explain transfusion dependent anemia. Colonoscopy with portal colopathy and diffuse edema, changes of Cdiff colitis on colonoscopy   COLONOSCOPY WITH PROPOFOL N/A 01/19/2021   Procedure: COLONOSCOPY WITH PROPOFOL;  Surgeon: [name], MD;  Location: [facility] ENDOSCOPY;  Service: Gastroenterology;  Laterality: N/A;   COLONOSCOPY WITH PROPOFOL N/A 01/16/2021   Procedure: COLONOSCOPY WITH PROPOFOL;  Surgeon: Daneil Dolin, MD;  [facility];  Service: Endoscopy;  Laterality: N/A;  10:15am, pt is on dialysis   ESOPHAGEAL BANDING N/A 02/03/2018   Procedure: ESOPHAGEAL BANDING;  Surgeon:  Rourk, [name], MD;  [facility];  Service: Endoscopy;  Laterality: N/A;   ESOPHAGEAL BANDING  01/16/2021   Procedure: ESOPHAGEAL BANDING;  Surgeon: Daneil Dolin, MD;  [facility];  Service: Endoscopy;;   ESOPHAGOGASTRODUODENOSCOPY  01/15/2005   Three columns grade 1 to 2 esophageal varices, otherwise normal esophageal mucosa.  Esophagus was not manipulated otherwise./Nodularity of the antrum with overlying erosions, nonspecific finding. Path showed rare H.pylori   ESOPHAGOGASTRODUODENOSCOPY  09/2008   Dr. Gaylene Brooks columns of grade 2-3 esoph varices, only one column was prominent. Portal gastropathy, multiple gastrc polyps at antrum, two were 2cm with black eschar, bulbar polyps, bulbar erosions   ESOPHAGOGASTRODUODENOSCOPY  11/2004   Dr. Brantley Stage 3 esoph varices   ESOPHAGOGASTRODUODENOSCOPY  03/31/2012   RMR: 4 columns(3-GR 2, 1-GR1) non-bleeding esophageal varices, portal  gastropathy, small HH, early GAVE, multiple gastric polyps    ESOPHAGOGASTRODUODENOSCOPY (EGD) WITH PROPOFOL N/A 02/03/2018   Dr. Gala Romney: Esophageal varices, 3 columns grade 1-2.  Portal hypertensive gastropathy.  Multiple gastric polyps, biopsy consistent with hyperplastic.   ESOPHAGOGASTRODUODENOSCOPY (EGD) WITH PROPOFOL N/A 09/19/2019   Fields: grade I and II esophageal varcies, mild portal hypertensive gastropathy, moderate gastritis but no H. pylori, single hyperplastic gastric polyp removed, obvious source for melena/transfusion dependent anemia not identified, may be due to friable gastric and duodenal mucosa in the setting of portal hypertension   ESOPHAGOGASTRODUODENOSCOPY (EGD) WITH PROPOFOL N/A 01/16/2021   Procedure: ESOPHAGOGASTRODUODENOSCOPY (EGD) WITH PROPOFOL;  Surgeon: Daneil Dolin, MD;  [facility];  Service: Endoscopy;  Laterality: N/A;   GASTRIC VARICES BANDING  03/31/2012   Procedure: GASTRIC VARICES BANDING;  Surgeon: Daneil Dolin, MD;  [facility];  Service: Endoscopy;  Laterality: N/A;   GIVENS CAPSULE STUDY N/A 09/21/2019   Poor study with a lot of debris obstructing much of the view of the first approximate 3 hours out of 6 hours.  No obvious source of bleeding identified.  Sequela of bleeding and old blood seen in the form of"whisps" of blood, blood flecks mostly toward the end of the study.   HEMOSTASIS CLIP PLACEMENT  01/19/2021   Procedure: HEMOSTASIS CLIP PLACEMENT;  Surgeon: [name], MD;  Location[facility]COPY;  Service: Gastroenterology;;   IR REMOVAL TUN CV CATH W/O [facility]  02/15/2020   POLYPECTOMY  09/19/2019   Procedure: POLYPECTOMY;  Surgeon: Danie Binder, MD;  [facility];  Service: Endoscopy;;   POLYPECTOMY  01/19/2021   Procedure: POLYPECTOMY;  Surgeon: [name], MD;  Location: [facility] ENDOSCOPY;  Service: Gastroenterology;;   UMBILICAL HERNIA REPAIR  2017   exploratory laparotomy, abdominal washout        Family History  Problem Relation Age of Onset   Colon cancer Father 23       deceased age 18   Breast cancer Sister        deceased   Diabetes Sister    Stroke Mother    Healthy Son    Healthy Daughter    Lung cancer Neg Hx    Ovarian cancer Neg Hx     Social History   Tobacco Use   Smoking status: Former    Packs/day: 2.00    Years: 25.00    Pack years: 50.00    Types: Cigarettes    Quit date: 01/28/1986    Years since quitting: 35.2   Smokeless tobacco: Never  Vaping Use   Vaping Use: Never used  Substance Use Topics  Alcohol use: No    Comment: quit in 05/2013   Drug use: No    Home Medications Prior to Admission medications   Medication Sig Start Date End Date Taking? Authorizing Provider  acetaminophen (TYLENOL) 650 MG CR tablet Take 650 mg by mouth every 8 (eight) hours as needed for pain.    [provider]  Alcohol Swabs (ALCOHOL PREP) 70 % PADS Apply 1 each topically 3 (three) times daily. 01/31/20   [provider]  ALPRAZolam Duanne Moron) 0.5 MG tablet Take 0.5 mg by mouth See admin instructions. Take one tablet (0.5 mg) by mouth on Monday, Wednesday, Friday before dialysis, may also take one tablet (0.5 mg) daily as needed for anxiety. 09/21/20   [provider]  Ascorbic Acid (VITAMIN C) 1000 MG tablet Take 1,000 mg by mouth in the morning.    [provider]  calcium carbonate (TUMS - DOSED IN MG ELEMENTAL CALCIUM) 500 MG chewable tablet Chew 1 tablet by mouth 3 (three) times daily with meals.    [provider]  Calcium Carbonate-Simethicone (ALKA-SELTZER HEARTBURN + GAS) 750-80 MG CHEW Chew 1-2 tablets by mouth 2 (two) times daily as needed (acid reflux).    [provider]  diltiazem (TIAZAC) 120 MG 24 hr capsule Take 120 mg by mouth daily. 05/28/20   [provider]  furosemide (LASIX) 40 MG tablet Take 80 mg by mouth daily. 12/29/19   [provider]  gabapentin (NEURONTIN) 100 MG  capsule TAKE 1 CAPSULE BY MOUTH AT BEDTIME Patient taking differently: Take 100 mg by mouth daily with supper. 05/28/20   Waynetta Sandy, MD  GLUCAGEN HYPOKIT 1 MG SOLR injection Inject 1 mg into the muscle once as needed for low blood sugar. 11/28/20   [provider]  hydrocortisone (ANUSOL-HC) 2.5 % rectal cream Place 1 application rectally 2 (two) times daily. Patient taking differently: Place 1 application rectally 2 (two) times daily as needed for hemorrhoids or anal itching. 03/27/20   Erenest Rasher, PA-C  hydrOXYzine (ATARAX/VISTARIL) 25 MG tablet Take 25 mg by mouth 3 (three) times daily. For itching 11/23/20   [provider]  insulin degludec (TRESIBA FLEXTOUCH) 200 UNIT/ML FlexTouch Pen Inject 120 Units into the skin at bedtime. When blood sugars are up 02/20/20   Barton Dubois, MD  insulin lispro (HUMALOG) 100 UNIT/ML KwikPen Inject 10-50 Units into the skin 3 (three) times daily with meals. Per sliding scale based on CBG    [provider]  iron polysaccharides (NIFEREX) 150 MG capsule Take 150 mg by mouth daily with supper.    [provider]  lactulose (CONSTULOSE) 10 GM/15ML solution TAKE 45 MLS EVERY MORNING AND TAKE 45 MLS EVERY EVENING Patient taking differently: Take 30 g by mouth See admin instructions. Take 45 mls (30 g) by mouth on Sunday, Tuesday, Thursday, Saturday mornings (non-dialysis days) unless going to MD appointment; take 45 mls (30 g) daily at bedtime 07/16/20   Mahala Menghini, PA-C  Lancet Devices (ADJUSTABLE LANCING DEVICE) MISC Apply 1 each topically daily. 05/23/20   [provider]  levofloxacin (LEVAQUIN) 500 MG tablet TAKE 1 TABLET BY MOUTH THREE TIMES EVERY WEEK AFTER DIALYSIS 03/14/21   Erenest Rasher, PA-C  metoprolol succinate (TOPROL-XL) 25 MG 24 hr tablet Take 1 tablet (25 mg total) by mouth daily. Take at bedtime  on Monday ,Wednesday and Friday due to dialaylisis Patient taking differently:  Take 25 mg by mouth See admin instructions. Take one tablet (25  mg) by mouth at bedtime every night and take one tablet (25 mg) in morning on  Sun, Tues, Thurs and Sat (non-dialysis days) 01/06/20   Barton Dubois, MD  midodrine (PROAMATINE) 10 MG tablet Take 1 tablet (10 mg total) by mouth 2 (two) times daily with a meal. 04/06/20   Tat, Shanon Brow, MD  milk thistle 175 MG tablet Take 175 mg by mouth 3 (three) times daily.     [provider]  mirtazapine (REMERON) 30 MG tablet Take 1 tablet (30 mg total) by mouth at bedtime. Patient taking differently: Take 30 mg by mouth daily with supper. 02/10/20   Roxan Hockey, MD  Multiple Vitamin (MULTIVITAMIN WITH MINERALS) TABS tablet Take 1 tablet by mouth in the morning.     [provider]  NOVOFINE 32G X 6 MM MISC Inject into the skin. 01/31/20   [provider]  nystatin (MYCOSTATIN/NYSTOP) powder Apply topically 3 (three) times daily. Into groin area and affected area Patient taking differently: Apply 1 application topically as needed. Into groin area and affected area 01/06/20   Barton Dubois, MD  omega-3 acid ethyl esters (LOVAZA) 1 g capsule Take 2 g by mouth 2 (two) times daily.  04/07/19   [provider]  ondansetron (ZOFRAN) 4 MG tablet Take 4 mg by mouth every 8 (eight) hours as needed for nausea or vomiting.    [provider]  oxyCODONE-acetaminophen (PERCOCET/ROXICET) 5-325 MG tablet Take 1 tablet by mouth every 4 (four) hours as needed for moderate pain. 02/27/20   [provider]  pantoprazole (PROTONIX) 40 MG tablet Take 2 tablets (80 mg total) by mouth daily. 01/21/21   Shelly Coss, MD  Ped Vitamins ACD Fl-Iron (TRI-VIT/FLUORIDE/IRON PO) Take 1 tablet by mouth daily. 08/01/19   [provider]  polyethylene glycol-electrolytes (TRILYTE) 420 g solution Take 4,000 mLs by mouth as directed. 11/19/20   Rourk, [name], MD  pravastatin (PRAVACHOL) 20 MG tablet Take 20 mg by mouth in the  morning.     [provider]  Probiotic Product (PROBIOTIC DAILY PO) Take 1 tablet by mouth in the morning.     [provider]  spironolactone (ALDACTONE) 50 MG tablet Take 50 mg by mouth in the morning.     [provider]  thiamine (VITAMIN B-1) 100 MG tablet Take 100 mg by mouth daily. 08/27/20   [provider]  tiZANidine (ZANAFLEX) 2 MG tablet Take 2 mg by mouth 2 (two) times daily as needed for muscle spasms.  02/29/20   [provider]  triamcinolone cream (KENALOG) 0.1 % Apply 1 application topically 2 (two) times daily as needed (dry skin Itching).  12/30/17   [provider]  venlafaxine (EFFEXOR) 75 MG tablet Take 75 mg by mouth 3 (three) times daily with meals.     [provider]  XIFAXAN 550 MG TABS tablet TAKE 1 TABLET BY MOUTH TWICE DAILY 02/13/21   Mahala Menghini, PA-C    Allergies    Tape  Review of Systems   Review of Systems  Constitutional:  Negative for chills and fever.  HENT:  Negative for congestion and sore throat.   Eyes: Negative.   Respiratory:  Negative for chest tightness and shortness of breath.   Cardiovascular:  Negative for chest pain.  Gastrointestinal:  Negative for abdominal pain, nausea and vomiting.  Genitourinary: Negative.   Musculoskeletal:  Negative for arthralgias, joint swelling and neck pain.  Skin: Negative.  Negative for rash and wound.  Neurological:  Negative for dizziness, weakness, light-headedness, numbness and headaches.  Psychiatric/Behavioral: Negative.     Physical Exam Updated Vital Signs BP (!) 153/77   Pulse 68   Temp 97.8 F (36.6 C) (Oral)   Resp 18   SpO2 100%   Physical Exam  ED Results / Procedures / Treatments   Labs (all labs ordered are listed, but only abnormal results are displayed) Labs Reviewed  CBC WITH DIFFERENTIAL/PLATELET - Abnormal; Notable for the following components:      Result Value   WBC 3.1 (*)    RBC 2.66 (*)    Hemoglobin 8.8  (*)    HCT 28.0 (*)    MCV 105.3 (*)    RDW 17.1 (*)    Platelets 95 (*)    Lymphs Abs 0.3 (*)    All other components within normal limits  COMPREHENSIVE METABOLIC PANEL - Abnormal; Notable for the following components:   Sodium 129 (*)    Potassium 3.3 (*)    Chloride 91 (*)    Glucose, Bld 216 (*)    BUN 30 (*)    Creatinine, Ser 2.89 (*)    Calcium 8.4 (*)    Albumin 2.9 (*)    Alkaline Phosphatase 227 (*)    GFR, Estimated 22 (*)    All other components within normal limits  CULTURE, BLOOD (ROUTINE X 2)  CULTURE, BLOOD (ROUTINE X 2)  LACTIC ACID, PLASMA    EKG None  Radiology US Paracentesis  Result Date: 04/10/2021 INDICATION: Recurrent ascites EXAM: ULTRASOUND GUIDED LLQ PARACENTESIS MEDICATIONS: 10 cc 1% lidocaine. COMPLICATIONS: None immediate. PROCEDURE: Informed written consent was obtained from the patient after a discussion of the risks, benefits and alternatives to treatment. A timeout was performed prior to the initiation of the procedure. Initial ultrasound scanning demonstrates a large amount of ascites within the left lower abdominal quadrant. The left lower abdomen was prepped and draped in the usual sterile fashion. 1% lidocaine was used for local anesthesia. Following this, a Yueh catheter was introduced. An ultrasound image was saved for documentation purposes. The paracentesis was performed. The catheter was removed and a dressing was applied. The patient tolerated the procedure well without immediate post procedural complication. Patient received post-procedure intravenous albumin; see nursing notes for details. FINDINGS: A total of approximately 2.4 liters of cloudy yellow fluid was removed. Samples were sent to the laboratory as requested by the clinical team. IMPRESSION: Successful ultrasound-guided paracentesis yielding 2.4 liters of peritoneal fluid. Read by Lavonia Drafts [facility] Electronically Signed   By: Lavonia Dana M.D.   On: 04/10/2021 10:14     Procedures Procedures   Medications Ordered in ED Medications  cefTRIAXone (ROCEPHIN) 1 g in sodium chloride 0.9 % 100 mL IVPB (0 g Intravenous Stopped 04/10/21 1926)  piperacillin-tazobactam (ZOSYN) IVPB 2.25 g (2.25 g Intravenous New Bag/Given 04/10/21 2029)    ED Course  I have reviewed the triage vital signs and the nursing notes.  Pertinent labs & imaging results that were available during my care of the patient were reviewed by me and considered in my medical decision making (see chart for details).    MDM Rules/Calculators/A&P                           Patient with SBP without complaints of abdominal pain, fevers or chills.  History of prior SBP both in 2021 and 2020 per his chart.  Jeffrey stain is positive for Jeffrey-positive  rods, culture results are pending at this time.  Labs were obtained including blood cultures.  Initially antibiotics including Rocephin and vancomycin were ordered, however after consult with pharmacy Zosyn was recommended in place of these other choices.  Patient does have a history of prophylactic Levaquin 3 times weekly.  Patient will require admission for continued antibiotics.  Call placed to hospitalist.  Discussed with Dr. Clearence Ped who accepts pt for admission. Final Clinical Impression(s) / ED Diagnoses Final diagnoses:  SBP (spontaneous bacterial peritonitis) [facility])    Rx / DC Orders ED Discharge Orders     None        Landis Martins 04/10/21 2123    Noemi Chapel, MD 04/11/21 1436

## 2021-04-10 NOTE — Telephone Encounter (Signed)
This has been addressed. Patient will be going to the hospital today for admission to be treated for SBP despite a PMN count that is not consistent with SBP. His wife reported he was complaining of some abdominal pain today. No fever. I have already updated the ED charge nurse.

## 2021-04-10 NOTE — Telephone Encounter (Signed)
Spoke to lab at [facility] informed me that pt has Gram Positive Cocci in clusters.

## 2021-04-11 ENCOUNTER — Encounter (HOSPITAL_COMMUNITY): Payer: Self-pay | Admitting: Family Medicine

## 2021-04-11 ENCOUNTER — Ambulatory Visit (HOSPITAL_COMMUNITY): Payer: Medicare Other | Admitting: Physician Assistant

## 2021-04-11 DIAGNOSIS — K7031 Alcoholic cirrhosis of liver with ascites: Secondary | ICD-10-CM

## 2021-04-11 DIAGNOSIS — K219 Gastro-esophageal reflux disease without esophagitis: Secondary | ICD-10-CM | POA: Diagnosis not present

## 2021-04-11 DIAGNOSIS — E8809 Other disorders of plasma-protein metabolism, not elsewhere classified: Secondary | ICD-10-CM | POA: Diagnosis not present

## 2021-04-11 DIAGNOSIS — K652 Spontaneous bacterial peritonitis: Secondary | ICD-10-CM | POA: Diagnosis not present

## 2021-04-11 DIAGNOSIS — E1122 Type 2 diabetes mellitus with diabetic chronic kidney disease: Secondary | ICD-10-CM

## 2021-04-11 DIAGNOSIS — E782 Mixed hyperlipidemia: Secondary | ICD-10-CM

## 2021-04-11 DIAGNOSIS — Z794 Long term (current) use of insulin: Secondary | ICD-10-CM

## 2021-04-11 DIAGNOSIS — K625 Hemorrhage of anus and rectum: Secondary | ICD-10-CM

## 2021-04-11 DIAGNOSIS — E871 Hypo-osmolality and hyponatremia: Secondary | ICD-10-CM

## 2021-04-11 LAB — COMPREHENSIVE METABOLIC PANEL
ALT: 14 U/L (ref 0–44)
AST: 15 U/L (ref 15–41)
Albumin: 2.6 g/dL — ABNORMAL LOW (ref 3.5–5.0)
Alkaline Phosphatase: 162 U/L — ABNORMAL HIGH (ref 38–126)
Anion gap: 10 (ref 5–15)
BUN: 32 mg/dL — ABNORMAL HIGH (ref 8–23)
CO2: 27 mmol/L (ref 22–32)
Calcium: 8.4 mg/dL — ABNORMAL LOW (ref 8.9–10.3)
Chloride: 95 mmol/L — ABNORMAL LOW (ref 98–111)
Creatinine, Ser: 3.15 mg/dL — ABNORMAL HIGH (ref 0.61–1.24)
GFR, Estimated: 20 mL/min — ABNORMAL LOW (ref >60)
Glucose, Bld: 84 mg/dL (ref 70–99)
Potassium: 3.3 mmol/L — ABNORMAL LOW (ref 3.5–5.1)
Sodium: 132 mmol/L — ABNORMAL LOW (ref 135–145)
Total Bilirubin: 1 mg/dL (ref 0.3–1.2)
Total Protein: 6 g/dL — ABNORMAL LOW (ref 6.5–8.1)

## 2021-04-11 LAB — HEPATITIS B SURFACE ANTIGEN: Hepatitis B Surface Ag: NONREACTIVE

## 2021-04-11 LAB — CBC WITH DIFFERENTIAL/PLATELET
Abs Immature Granulocytes: 0 10*3/uL (ref 0.00–0.07)
Basophils Absolute: 0 10*3/uL (ref 0.0–0.1)
Basophils Relative: 1 %
Eosinophils Absolute: 0.3 10*3/uL (ref 0.0–0.5)
Eosinophils Relative: 8 %
HCT: 28.4 % — ABNORMAL LOW (ref 39.0–52.0)
Hemoglobin: 8.8 g/dL — ABNORMAL LOW (ref 13.0–17.0)
Immature Granulocytes: 0 %
Lymphocytes Relative: 12 %
Lymphs Abs: 0.4 10*3/uL — ABNORMAL LOW (ref 0.7–4.0)
MCH: 33.1 pg (ref 26.0–34.0)
MCHC: 31 g/dL (ref 30.0–36.0)
MCV: 106.8 fL — ABNORMAL HIGH (ref 80.0–100.0)
Monocytes Absolute: 0.3 10*3/uL (ref 0.1–1.0)
Monocytes Relative: 9 %
Neutro Abs: 2.3 10*3/uL (ref 1.7–7.7)
Neutrophils Relative %: 70 %
Platelets: 89 10*3/uL — ABNORMAL LOW (ref 150–400)
RBC: 2.66 MIL/uL — ABNORMAL LOW (ref 4.22–5.81)
RDW: 16.9 % — ABNORMAL HIGH (ref 11.5–15.5)
WBC: 3.2 10*3/uL — ABNORMAL LOW (ref 4.0–10.5)
nRBC: 0 % (ref 0.0–0.2)

## 2021-04-11 LAB — HEMOGLOBIN A1C
Hgb A1c MFr Bld: 6.1 % — ABNORMAL HIGH (ref 4.8–5.6)
Mean Plasma Glucose: 128.37 mg/dL

## 2021-04-11 LAB — PROTIME-INR
INR: 1.2 (ref 0.8–1.2)
Prothrombin Time: 15.6 seconds — ABNORMAL HIGH (ref 11.4–15.2)

## 2021-04-11 LAB — GLUCOSE, CAPILLARY
Glucose-Capillary: 52 mg/dL — ABNORMAL LOW (ref 70–99)
Glucose-Capillary: 54 mg/dL — ABNORMAL LOW (ref 70–99)
Glucose-Capillary: 63 mg/dL — ABNORMAL LOW (ref 70–99)
Glucose-Capillary: 140 mg/dL — ABNORMAL HIGH (ref 70–99)
Glucose-Capillary: 152 mg/dL — ABNORMAL HIGH (ref 70–99)

## 2021-04-11 LAB — METHYLMALONIC ACID, SERUM: Methylmalonic Acid, Quantitative: 476 nmol/L — ABNORMAL HIGH (ref 0–378)

## 2021-04-11 LAB — PHOSPHORUS: Phosphorus: 4.4 mg/dL (ref 2.5–4.6)

## 2021-04-11 LAB — HEPATITIS B SURFACE ANTIBODY,QUALITATIVE: Hep B S Ab: NONREACTIVE

## 2021-04-11 LAB — PATHOLOGIST SMEAR REVIEW

## 2021-04-11 LAB — MAGNESIUM: Magnesium: 1.6 mg/dL — ABNORMAL LOW (ref 1.7–2.4)

## 2021-04-11 MED ORDER — LACTULOSE 10 GM/15ML PO SOLN
30.0000 g | Freq: Two times a day (BID) | ORAL | Status: DC
  Administered 2021-04-11 – 2021-04-13 (×6): 30 g via ORAL
  Filled 2021-04-11 (×9): qty 60

## 2021-04-11 MED ORDER — METOPROLOL SUCCINATE ER 25 MG PO TB24
25.0000 mg | ORAL_TABLET | Freq: Once | ORAL | Status: AC
  Administered 2021-04-11: 25 mg via ORAL
  Filled 2021-04-11: qty 1

## 2021-04-11 MED ORDER — LACTULOSE 10 GM/15ML PO SOLN: 30.0000 g | Freq: Once | ORAL | Status: DC

## 2021-04-11 MED ORDER — ALBUMIN HUMAN 25 % IV SOLN
25.0000 g | Freq: Four times a day (QID) | INTRAVENOUS | Status: AC
  Administered 2021-04-13 (×4): 25 g via INTRAVENOUS
  Filled 2021-04-11 (×4): qty 100

## 2021-04-11 MED ORDER — LACTULOSE 10 GM/15ML PO SOLN: 30.0000 g | ORAL | Status: DC

## 2021-04-11 MED ORDER — SODIUM CHLORIDE 0.9 % IV SOLN: 100.0000 mL | INTRAVENOUS | Status: DC | PRN

## 2021-04-11 MED ORDER — LIDOCAINE HCL (PF) 1 % IJ SOLN: 5.0000 mL | INTRAMUSCULAR | Status: DC | PRN

## 2021-04-11 MED ORDER — PENTAFLUOROPROP-TETRAFLUOROETH EX AERO: 1.0000 "application " | INHALATION_SPRAY | CUTANEOUS | Status: DC | PRN

## 2021-04-11 MED ORDER — METOPROLOL SUCCINATE ER 25 MG PO TB24
25.0000 mg | ORAL_TABLET | ORAL | Status: DC
  Administered 2021-04-12 – 2021-04-15 (×3): 25 mg via ORAL
  Filled 2021-04-11 (×3): qty 1

## 2021-04-11 MED ORDER — DARBEPOETIN ALFA 150 MCG/0.3ML IJ SOSY
PREFILLED_SYRINGE | INTRAMUSCULAR | Status: AC
  Administered 2021-04-11: 150 ug via INTRAVENOUS
  Filled 2021-04-11: qty 0.3

## 2021-04-11 MED ORDER — LACTULOSE 10 GM/15ML PO SOLN: 30.0000 g | Freq: Every day | ORAL | Status: DC

## 2021-04-11 MED ORDER — CHLORHEXIDINE GLUCONATE CLOTH 2 % EX PADS
6.0000 | MEDICATED_PAD | Freq: Every day | CUTANEOUS | Status: DC
  Administered 2021-04-12 – 2021-04-13 (×2): 6 via TOPICAL

## 2021-04-11 MED ORDER — LIDOCAINE-PRILOCAINE 2.5-2.5 % EX CREA: 1.0000 "application " | TOPICAL_CREAM | CUTANEOUS | Status: DC | PRN

## 2021-04-11 MED ORDER — DARBEPOETIN ALFA 150 MCG/0.3ML IJ SOSY
150.0000 ug | PREFILLED_SYRINGE | INTRAMUSCULAR | Status: DC
  Filled 2021-04-11: qty 0.3

## 2021-04-11 MED ORDER — PIPERACILLIN-TAZOBACTAM 3.375 G IVPB
3.3750 g | Freq: Two times a day (BID) | INTRAVENOUS | Status: DC
  Administered 2021-04-11 – 2021-04-15 (×9): 3.375 g via INTRAVENOUS
  Filled 2021-04-11 (×9): qty 50

## 2021-04-11 MED ORDER — ALBUMIN HUMAN 25 % IV SOLN
25.0000 g | Freq: Four times a day (QID) | INTRAVENOUS | Status: AC
  Administered 2021-04-11 – 2021-04-12 (×4): 25 g via INTRAVENOUS
  Filled 2021-04-11 (×3): qty 100

## 2021-04-11 MED ORDER — INSULIN DETEMIR 100 UNIT/ML ~~LOC~~ SOLN
10.0000 [IU] | Freq: Every day | SUBCUTANEOUS | Status: DC
  Administered 2021-04-12: 10 [IU] via SUBCUTANEOUS
  Filled 2021-04-11 (×3): qty 0.1

## 2021-04-11 NOTE — H&P (Signed)
TRH H&P    Patient Demographics:    Jeffrey [name], is a 75 y.o. male  MRN: [id number]  DOB - 01/12/1946  Admit Date - 04/10/2021  Referring MD/NP/PA: Alfonzo Feller  Outpatient Primary MD for the patient is Monico Blitz, MD  Patient coming from: Home  Chief complaint- Abnormal labs   HPI:    Jeffrey [name]  is a 75 y.o. 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  Patient reports that he got in for a therapeutic paracentesis was apparently normal for him and he was called by his physician and told to come into the ER for bacteria in the fluid.  It looks like the peritoneal fluid grew gram-negative rods.  Patient reports he had otherwise been in his normal state of health.  He denies any fevers.  Admits to mild fatigue.  At this time he feels like his abdomen is less bloated and he has no dyspnea.  He reports that before the paracentesis his abdomen was quite distended with fluid, and he found it more difficult to breathe because of that.  Patient reports that he still does make urine, and has an indwelling Foley.  Patient reports that sometimes the bag is full after 24 hours, other times its not.  He really has not noticed a pattern with that.  Patient reports no associated abdominal pain.  No fevers.  Onset of his fluid buildup was over a couple of weeks, gradual, constant.  Nothing that he notes makes the fluid retention worse.  He reports that dialysis always tells him to restrict his fluids and go on a low-sodium diet.  Patient is frustrated with this because then they tell him his sodium is low.  The only thing that makes the fluid retention better is the therapeutic taps.  Patient has no other complaints at this  time.  Patient no longer drinks alcohol.  He is vaccinated for COVID and flu.  He is full code.  In the emergency department Temp 97.8, heart rate 64-77, respiratory rate 16-20, blood pressure 153/77 Leukopenia 3.1, hemoglobin 8.8 Hyponatremia 129, which corrects to 132 Hyperglycemia 216 Patient initially started on Rocephin but due to prophylactic Levaquin in the outpatient setting, it was decided that Zosyn would provide better coverage, Zosyn started as well Admission requested for management of SBP     Review of systems:    In addition to the HPI above,  No Fever-chills, No Headache, No changes with Vision or hearing, No problems swallowing food or Liquids, No Chest pain, Cough or Shortness of Breath, No Abdominal pain, No Nausea or Vomiting, bowel movements are regular, No Blood in stool or Urine, No dysuria, No new skin rashes or bruises, No new joints pains-aches,  No new weakness, tingling, numbness in any extremity, No recent weight gain or loss, No polyuria, polydypsia or polyphagia, No significant Mental Stressors.  All other systems reviewed and  are negative.    Past History of the following :    Past Medical History:  Diagnosis Date   A-fib [facility])    when initially stating dialysis january 2021 at [facility] per spouse; never heard anything else about it   Alcoholic cirrhosis [facility])    patient reports completing hep a and b vaccines in 2006   Anemia    has had 3 units of prbcs 2013   Anxiety    Arthritis    Asymptomatic gallstones    Ultrasound in 2006   B12 deficiency 11/21/2020   C. difficile colitis    Cholelithiasis    Chronic kidney disease    Dialysis M/W/F/Sa   Depression    Diabetes ([place])    Duodenal ulcer with hemorrhage    per patient in 2006 or 2007, records have been requested   Dyspnea    low oxygen level -  has O2 2 L all day   Esophageal varices ([place])    see PSH   GERD (gastroesophageal reflux disease)    Heart burn     History of alcohol abuse    quit 05/2013   HOH (hard of hearing)    Hypertension    Liver cirrhosis ([place])    SBP (spontaneous bacterial peritonitis) ([place])    2020, November 2021.    Splenomegaly    Ultrasound in 2006      Past Surgical History:  Procedure Laterality Date   AGILE CAPSULE N/A 09/20/2019   Procedure: AGILE CAPSULE;  Surgeon: Daneil Dolin, MD;  [facility];  Service: Endoscopy;  Laterality: N/A;   APPENDECTOMY     AV FISTULA PLACEMENT Left 08/17/2019   Procedure: ARTERIOVENOUS (AV) FISTULA CREATION VERSES ARTERIOVENOUS GRAFT;  Surgeon: [name], MD;  Location: [facility] OR;  Service: Vascular;  Laterality: Left;   BASCILIC VEIN TRANSPOSITION Left 09/28/2019   Procedure: LEFT SECOND STAGE [place];  Surgeon: [name], MD;  Location: [place];  Service: Vascular;  Laterality: Left;   BIOPSY  02/03/2018   Procedure: BIOPSY;  Surgeon: Daneil Dolin, MD;  [facility];  Service: Endoscopy;;  bx of gastric polyps   BIOPSY  09/19/2019   Procedure: BIOPSY;  Surgeon: Danie Binder, MD;  [facility];  Service: Endoscopy;;   BIOPSY  01/16/2021   Procedure: BIOPSY;  Surgeon: Daneil Dolin, MD;  [facility];  Service: Endoscopy;;   CATARACT EXTRACTION Right    COLONOSCOPY  03/10/2005   Rectal polyp as described above, removed with snare. Left sided  diverticula. The remainder of the colonic mucosa appeared normal. Inflammed polyp on path.   COLONOSCOPY  04/1999   Dr. Thea Silversmith polyps removed,  Path showed hyperplastic   COLONOSCOPY  10/2015   Dr. Britta Mccreedy: diverticulosis, single sessile tubular adenoma 3-19mm in size removed from descending colon.    COLONOSCOPY WITH ESOPHAGOGASTRODUODENOSCOPY (EGD)  03/31/2012   QIH:[id number] AVMs. Colonic diverticulosis. 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 No significant source to explain transfusion dependent anemia. Colonoscopy with portal colopathy and diffuse edema, changes of Cdiff colitis on colonoscopy   COLONOSCOPY WITH PROPOFOL N/A 01/19/2021   Procedure: COLONOSCOPY WITH PROPOFOL;  Surgeon: [name], MD;  Location: [facility] ENDOSCOPY;  Service: Gastroenterology;  Laterality: N/A;  COLONOSCOPY WITH PROPOFOL N/A 01/16/2021   Procedure: COLONOSCOPY WITH PROPOFOL;  Surgeon: Daneil Dolin, MD;  [facility];  Service: Endoscopy;  Laterality: N/A;  10:15am, pt is on dialysis   ESOPHAGEAL BANDING N/A 02/03/2018   Procedure: ESOPHAGEAL BANDING;  Surgeon: Daneil Dolin, MD;  [facility];  Service: Endoscopy;  Laterality: N/A;   ESOPHAGEAL BANDING  01/16/2021   Procedure: ESOPHAGEAL BANDING;  Surgeon: Daneil Dolin, MD;  [facility];  Service: Endoscopy;;   ESOPHAGOGASTRODUODENOSCOPY  01/15/2005   Three columns grade 1 to 2 esophageal varices, otherwise normal esophageal mucosa.  Esophagus was not manipulated otherwise./Nodularity of the antrum with overlying erosions, nonspecific finding. Path showed rare H.pylori   ESOPHAGOGASTRODUODENOSCOPY  09/2008   Dr. Gaylene Brooks columns of grade 2-3 esoph varices, only one column was prominent. Portal gastropathy, multiple gastrc polyps at antrum, two were 2cm with black eschar, bulbar polyps, bulbar erosions   ESOPHAGOGASTRODUODENOSCOPY  11/2004   Dr. Brantley Stage 3 esoph varices   ESOPHAGOGASTRODUODENOSCOPY  03/31/2012   RMR: 4 columns(3-GR 2, 1-GR1) non-bleeding esophageal varices, portal gastropathy, small HH, early GAVE, multiple gastric polyps    ESOPHAGOGASTRODUODENOSCOPY (EGD) WITH PROPOFOL N/A 02/03/2018   Dr. Gala Romney: Esophageal varices, 3 columns grade 1-2.  Portal hypertensive gastropathy.  Multiple gastric polyps, biopsy consistent with hyperplastic.    ESOPHAGOGASTRODUODENOSCOPY (EGD) WITH PROPOFOL N/A 09/19/2019   Fields: grade I and II esophageal varcies, mild portal hypertensive gastropathy, moderate gastritis but no H. pylori, single hyperplastic gastric polyp removed, obvious source for melena/transfusion dependent anemia not identified, may be due to friable gastric and duodenal mucosa in the setting of portal hypertension   ESOPHAGOGASTRODUODENOSCOPY (EGD) WITH PROPOFOL N/A 01/16/2021   Procedure: ESOPHAGOGASTRODUODENOSCOPY (EGD) WITH PROPOFOL;  Surgeon: Daneil Dolin, MD;  [facility];  Service: Endoscopy;  Laterality: N/A;   GASTRIC VARICES BANDING  03/31/2012   Procedure: GASTRIC VARICES BANDING;  Surgeon: Daneil Dolin, MD;  [facility];  Service: Endoscopy;  Laterality: N/A;   GIVENS CAPSULE STUDY N/A 09/21/2019   Poor study with a lot of debris obstructing much of the view of the first approximate 3 hours out of 6 hours.  No obvious source of bleeding identified.  Sequela of bleeding and old blood seen in the form of"whisps" of blood, blood flecks mostly toward the end of the study.   HEMOSTASIS CLIP PLACEMENT  01/19/2021   Procedure: HEMOSTASIS CLIP PLACEMENT;  Surgeon: [name], MD;  Location: [facility] ENDOSCOPY;  Service: Gastroenterology;;   IR REMOVAL TUN CV CATH W/O FL  02/15/2020   POLYPECTOMY  09/19/2019   Procedure: POLYPECTOMY;  Surgeon: Danie Binder, MD;  [facility];  Service: Endoscopy;;   POLYPECTOMY  01/19/2021   Procedure: POLYPECTOMY;  Surgeon: [name], MD;  Location: [place];  Service: Gastroenterology;;   UMBILICAL HERNIA REPAIR  2017   exploratory laparotomy, abdominal washout      Social History:      Social History   Tobacco Use   Smoking status: Former    Packs/day: 2.00    Years: 25.00    Pack years: 50.00    Types: Cigarettes    Quit date: 01/28/1986    Years since quitting: 35.2   Smokeless tobacco: Never  Substance Use Topics    Alcohol use: No    Comment: quit in 05/2013       Family History :     Family History  Problem Relation Age  of Onset   Colon cancer Father 70       deceased age 40   Breast cancer Sister        deceased   Diabetes Sister    Stroke Mother    Healthy Son    Healthy Daughter    Lung cancer Neg Hx    Ovarian cancer Neg Hx       Home Medications:   Prior to Admission medications   Medication Sig Start Date End Date Taking? Authorizing Provider  acetaminophen (TYLENOL) 650 MG CR tablet Take 650 mg by mouth every 8 (eight) hours as needed for pain.    [provider]  Alcohol Swabs (ALCOHOL PREP) 70 % PADS Apply 1 each topically 3 (three) times daily. 01/31/20   [provider]  ALPRAZolam Duanne Moron) 0.5 MG tablet Take 0.5 mg by mouth See admin instructions. Take one tablet (0.5 mg) by mouth on Monday, Wednesday, Friday before dialysis, may also take one tablet (0.5 mg) daily as needed for anxiety. 09/21/20   [provider]  Ascorbic Acid (VITAMIN C) 1000 MG tablet Take 1,000 mg by mouth in the morning.    [provider]  calcium carbonate (TUMS - DOSED IN MG ELEMENTAL CALCIUM) 500 MG chewable tablet Chew 1 tablet by mouth 3 (three) times daily with meals.    [provider]  Calcium Carbonate-Simethicone (ALKA-SELTZER HEARTBURN + GAS) 750-80 MG CHEW Chew 1-2 tablets by mouth 2 (two) times daily as needed (acid reflux).    [provider]  diltiazem (TIAZAC) 120 MG 24 hr capsule Take 120 mg by mouth daily. 05/28/20   [provider]  furosemide (LASIX) 40 MG tablet Take 80 mg by mouth daily. 12/29/19   [provider]  gabapentin (NEURONTIN) 100 MG capsule TAKE 1 CAPSULE BY MOUTH AT BEDTIME Patient taking differently: Take 100 mg by mouth daily with supper. 05/28/20   Waynetta Sandy, MD  GLUCAGEN HYPOKIT 1 MG SOLR injection Inject 1 mg into the muscle once as needed for low blood sugar. 11/28/20   [provider]  hydrocortisone (ANUSOL-HC) 2.5 % rectal cream Place 1 application rectally 2 (two) times daily. Patient taking differently: Place 1 application rectally 2 (two) times daily as needed for hemorrhoids or anal itching. 03/27/20   Erenest Rasher, PA-C  hydrOXYzine (ATARAX/VISTARIL) 25 MG tablet Take 25 mg by mouth 3 (three) times daily. For itching 11/23/20   [provider]  insulin degludec (TRESIBA FLEXTOUCH) 200 UNIT/ML FlexTouch Pen Inject 120 Units into the skin at bedtime. When blood sugars are up 02/20/20   Barton Dubois, MD  insulin lispro (HUMALOG) 100 UNIT/ML KwikPen Inject 10-50 Units into the skin 3 (three) times daily with meals. Per sliding scale based on CBG    [provider]  iron polysaccharides (NIFEREX) 150 MG capsule Take 150 mg by mouth daily with supper.    [provider]  lactulose (CONSTULOSE) 10 GM/15ML solution TAKE 45 MLS EVERY MORNING AND TAKE 45 MLS EVERY EVENING Patient taking differently: Take 30 g by mouth See admin instructions. Take 45 mls (30 g) by mouth on Sunday, Tuesday, Thursday, Saturday mornings (non-dialysis days) unless going to MD appointment; take 45 mls (30 g) daily at bedtime 07/16/20   Mahala Menghini, PA-C  Lancet Devices (ADJUSTABLE LANCING DEVICE) MISC Apply 1 each topically daily. 05/23/20   [provider]  levofloxacin (LEVAQUIN) 500 MG tablet TAKE 1 TABLET BY MOUTH THREE TIMES EVERY WEEK AFTER DIALYSIS  03/14/21   Erenest Rasher, PA-C  metoprolol succinate (TOPROL-XL) 25 MG 24 hr tablet Take 1 tablet (25 mg total) by mouth daily. Take at bedtime  on Monday ,Wednesday and Friday due to dialaylisis Patient taking differently: Take 25 mg by mouth See admin instructions. Take one tablet (25 mg) by mouth at bedtime every night and take one tablet (25 mg) in morning on  Sun, Tues, Thurs and Sat (non-dialysis days) 01/06/20   Barton Dubois, MD  midodrine (PROAMATINE) 10 MG tablet Take 1 tablet (10 mg  total) by mouth 2 (two) times daily with a meal. 04/06/20   Tat, Shanon Brow, MD  milk thistle 175 MG tablet Take 175 mg by mouth 3 (three) times daily.     [provider]  mirtazapine (REMERON) 30 MG tablet Take 1 tablet (30 mg total) by mouth at bedtime. Patient taking differently: Take 30 mg by mouth daily with supper. 02/10/20   Roxan Hockey, MD  Multiple Vitamin (MULTIVITAMIN WITH MINERALS) TABS tablet Take 1 tablet by mouth in the morning.     [provider]  NOVOFINE 32G X 6 MM MISC Inject into the skin. 01/31/20   [provider]  nystatin (MYCOSTATIN/NYSTOP) powder Apply topically 3 (three) times daily. Into groin area and affected area Patient taking differently: Apply 1 application topically as needed. Into groin area and affected area 01/06/20   Barton Dubois, MD  omega-3 acid ethyl esters (LOVAZA) 1 g capsule Take 2 g by mouth 2 (two) times daily.  04/07/19   [provider]  ondansetron (ZOFRAN) 4 MG tablet Take 4 mg by mouth every 8 (eight) hours as needed for nausea or vomiting.    [provider]  oxyCODONE-acetaminophen (PERCOCET/ROXICET) 5-325 MG tablet Take 1 tablet by mouth every 4 (four) hours as needed for moderate pain. 02/27/20   [provider]  pantoprazole (PROTONIX) 40 MG tablet Take 2 tablets (80 mg total) by mouth daily. 01/21/21   Shelly Coss, MD  Ped Vitamins ACD Fl-Iron (TRI-VIT/FLUORIDE/IRON PO) Take 1 tablet by mouth daily. 08/01/19   [provider]  polyethylene glycol-electrolytes (TRILYTE) 420 g solution Take 4,000 mLs by mouth as directed. 11/19/20   Rourk, [name], MD  pravastatin (PRAVACHOL) 20 MG tablet Take 20 mg by mouth in the morning.     [provider]  Probiotic Product (PROBIOTIC DAILY PO) Take 1 tablet by mouth in the morning.     [provider]  spironolactone (ALDACTONE) 50 MG tablet Take 50 mg by mouth in the morning.     [provider]  thiamine (VITAMIN B-1)  100 MG tablet Take 100 mg by mouth daily. 08/27/20   [provider]  tiZANidine (ZANAFLEX) 2 MG tablet Take 2 mg by mouth 2 (two) times daily as needed for muscle spasms.  02/29/20   [provider]  triamcinolone cream (KENALOG) 0.1 % Apply 1 application topically 2 (two) times daily as needed (dry skin Itching).  12/30/17   [provider]  venlafaxine (EFFEXOR) 75 MG tablet Take 75 mg by mouth 3 (three) times daily with meals.     [provider]  XIFAXAN 550 MG TABS tablet TAKE 1 TABLET BY MOUTH TWICE DAILY 02/13/21   Mahala Menghini, PA-C     Allergies:     Allergies  Allergen Reactions   Tape Itching, Rash and Other (See Comments)    Tears skin     Physical Exam:   Vitals  Blood pressure [name])  150/67, pulse 70, temperature 98.2 F (36.8 C), temperature source Oral, resp. rate 19, height 5\' 7"  (1.702 m), weight 91.4 kg, SpO2 100 %.   1.  General: Patient lying supine in bed,  no acute distress   2. Psychiatric: Alert and oriented x 3, mood and behavior normal for situation, pleasant and cooperative with exam   3. Neurologic: Speech and language are normal, face is symmetric, moves all 4 extremities voluntarily, at baseline without acute deficits on limited exam   4. HEENMT:  Head is atraumatic, normocephalic, pupils reactive to light, neck is supple, trachea is midline, mucous membranes are moist   5. Respiratory : Lungs are clear to auscultation bilaterally without wheezing, rhonchi, rales, no cyanosis, no increase in work of breathing or accessory muscle use   6. Cardiovascular : Heart rate normal, rhythm is regular, blowing systolic murmur, rubs or gallops, significant peripheral edema present, peripheral pulses palpated   7. Gastrointestinal:  Abdomen is soft, moderately distended, nontender to palpation bowel sounds active, no masses or organomegaly palpated   8. Skin:  Skin is warm, dry and intact without rashes, acute lesions, or  ulcers on limited exam   9.Musculoskeletal:  No acute deformities or trauma, no asymmetry in tone, pitting edema to thigh, peripheral pulses palpated, no tenderness to palpation in the extremities     Data Review:    CBC Recent Labs  Lab 04/08/21 1220 04/10/21 1838  WBC 3.7* 3.1*  HGB 9.0* 8.8*  HCT 28.7* 28.0*  PLT 82* 95*  MCV 105.9* 105.3*  MCH 33.2 33.1  MCHC 31.4 31.4  RDW 17.2* 17.1*  LYMPHSABS 0.2* 0.3*  MONOABS 0.2 0.3  EOSABS 0.1 0.2  BASOSABS 0.0 0.0   ------------------------------------------------------------------------------------------------------------------  Results for orders placed or performed during the hospital encounter of 04/10/21 (from the past 48 hour(s))  Blood culture (routine x 2)     Status: None (Preliminary result)   Collection Time: 04/10/21  6:25 PM   Specimen: Right Antecubital; Blood  Result Value Ref Range   Specimen Description RIGHT ANTECUBITAL    Special Requests      Blood Culture adequate volume BOTTLES DRAWN AEROBIC AND ANAEROBIC Performed at [facility], [address] [place], [place] [address]    Culture PENDING    Report Status PENDING   CBC with Differential/Platelet     Status: Abnormal   Collection Time: 04/10/21  6:38 PM  Result Value Ref Range   WBC 3.1 (L) 4.0 - 10.5 K/uL   RBC 2.66 (L) 4.22 - 5.81 MIL/uL   Hemoglobin 8.8 (L) 13.0 - 17.0 g/dL   HCT 28.0 (L) 39.0 - 52.0 %   MCV 105.3 (H) 80.0 - 100.0 fL   MCH 33.1 26.0 - 34.0 pg   MCHC 31.4 30.0 - 36.0 g/dL   RDW 17.1 (H) 11.5 - 15.5 %   Platelets 95 (L) 150 - 400 K/uL    Comment: SPECIMEN CHECKED FOR CLOTS REPEATED TO VERIFY PLATELET COUNT CONFIRMED BY SMEAR    nRBC 0.0 0.0 - 0.2 %   Neutrophils Relative % 71 %   Neutro Abs 2.2 1.7 - 7.7 K/uL   Lymphocytes Relative 10 %   Lymphs Abs 0.3 (L) 0.7 - 4.0 K/uL   Monocytes Relative 11 %   Monocytes Absolute 0.3 0.1 - 1.0 K/uL   Eosinophils Relative 7 %   Eosinophils Absolute 0.2 0.0 - 0.5 K/uL    Basophils Relative 1 %   Basophils Absolute 0.0 0.0 - 0.1 K/uL  Smear Review MORPHOLOGY UNREMARKABLE    Immature Granulocytes 0 %   Abs Immature Granulocytes 0.01 0.00 - 0.07 K/uL   Polychromasia PRESENT    Ovalocytes PRESENT     Comment: Performed at [facility], [address] [place], [place] [address]  Comprehensive metabolic panel     Status: Abnormal   Collection Time: 04/10/21  6:38 PM  Result Value Ref Range   Sodium 129 (L) 135 - 145 mmol/L   Potassium 3.3 (L) 3.5 - 5.1 mmol/L   Chloride 91 (L) 98 - 111 mmol/L   CO2 27 22 - 32 mmol/L   Glucose, Bld 216 (H) 70 - 99 mg/dL    Comment: Glucose reference range applies only to samples taken after fasting for at least 8 hours.   BUN 30 (H) 8 - 23 mg/dL   Creatinine, Ser 2.89 (H) 0.61 - 1.24 mg/dL   Calcium 8.4 (L) 8.9 - 10.3 mg/dL   Total Protein 7.0 6.5 - 8.1 g/dL   Albumin 2.9 (L) 3.5 - 5.0 g/dL   AST 20 15 - 41 U/L   ALT 16 0 - 44 U/L   Alkaline Phosphatase 227 (H) 38 - 126 U/L   Total Bilirubin 0.7 0.3 - 1.2 mg/dL   GFR, Estimated 22 (L) >60 mL/min    Comment: (NOTE) Calculated using the CKD-EPI Creatinine Equation (2021)    Anion gap 11 5 - 15    Comment: Performed at [facility], [address] [place], [place] [address]  Blood culture (routine x 2)     Status: None (Preliminary result)   Collection Time: 04/10/21  6:38 PM   Specimen: BLOOD RIGHT ARM  Result Value Ref Range   Specimen Description BLOOD RIGHT ARM    Special Requests      Blood Culture adequate volume BOTTLES DRAWN AEROBIC AND ANAEROBIC Performed at [facility], [address] [place], [place] [address]    Culture PENDING    Report Status PENDING   Lactic acid, plasma     Status: None   Collection Time: 04/10/21  6:38 PM  Result Value Ref Range   Lactic Acid, Venous 1.7 0.5 - 1.9 mmol/L    Comment: Performed at [facility], [address], [place], [place] [address]  Resp Panel by RT-PCR (Flu A&B, Covid) Nasopharyngeal Swab     Status: None    Collection Time: 04/10/21  9:20 PM   Specimen: Nasopharyngeal Swab; Nasopharyngeal(NP) swabs in vial transport medium  Result Value Ref Range   SARS Coronavirus 2 by RT PCR NEGATIVE NEGATIVE    Comment: (NOTE) SARS-CoV-2 target nucleic acids are NOT DETECTED.  The SARS-CoV-2 RNA is generally detectable in upper respiratory specimens during the acute phase of infection. The lowest concentration of SARS-CoV-2 viral copies this assay can detect is 138 copies/mL. A negative result does not preclude SARS-Cov-2 infection and should not be used as the sole basis for treatment or other patient management decisions. A negative result may occur with  improper specimen collection/handling, submission of specimen other than nasopharyngeal swab, presence of viral mutation(s) within the areas targeted by this assay, and inadequate number of viral copies(<138 copies/mL). A negative result must be combined with clinical observations, patient history, and epidemiological information. The expected result is Negative.  Fact Sheet for Patients:  EntrepreneurPulse.com.au  Fact Sheet for Healthcare Providers:  IncredibleEmployment.be  This test is no t yet approved or cleared by the [place] FDA and  has been authorized for detection and/or diagnosis of  SARS-CoV-2 by FDA under an Emergency Use Authorization (EUA). This EUA will remain  in effect (meaning this test can be used) for the duration of the COVID-19 declaration under Section 564(b)(1) of the Act, 21 U.S.C.section 360bbb-3(b)(1), unless the authorization is terminated  or revoked sooner.       Influenza A by PCR NEGATIVE NEGATIVE   Influenza B by PCR NEGATIVE NEGATIVE    Comment: (NOTE) The Xpert Xpress SARS-CoV-2/FLU/RSV plus assay is intended as an aid in the diagnosis of influenza from Nasopharyngeal swab specimens and should not be used as a sole basis for treatment. Nasal washings  and aspirates are unacceptable for Xpert Xpress SARS-CoV-2/FLU/RSV testing.  Fact Sheet for Patients: EntrepreneurPulse.com.au  Fact Sheet for Healthcare Providers: IncredibleEmployment.be  This test is not yet approved or cleared by the [place] FDA and has been authorized for detection and/or diagnosis of SARS-CoV-2 by FDA under an Emergency Use Authorization (EUA). This EUA will remain in effect (meaning this test can be used) for the duration of the COVID-19 declaration under Section 564(b)(1) of the Act, 21 U.S.C. section 360bbb-3(b)(1), unless the authorization is terminated or revoked.  Performed at [facility], [address], [place], [place] [address]   Glucose, capillary     Status: Abnormal   Collection Time: 04/10/21 11:37 PM  Result Value Ref Range   Glucose-Capillary 170 (H) 70 - 99 mg/dL    Comment: Glucose reference range applies only to samples taken after fasting for at least 8 hours.    Chemistries  Recent Labs  Lab 04/10/21 1838  NA 129*  K 3.3*  CL 91*  CO2 27  GLUCOSE 216*  BUN 30*  CREATININE 2.89*  CALCIUM 8.4*  AST 20  ALT 16  ALKPHOS 227*  BILITOT 0.7   ------------------------------------------------------------------------------------------------------------------  ------------------------------------------------------------------------------------------------------------------ GFR: Estimated Creatinine Clearance: 24.2 mL/min (A) (by C-G formula based on SCr of 2.89 mg/dL (H)). Liver Function Tests: Recent Labs  Lab 04/10/21 1838  AST 20  ALT 16  ALKPHOS 227*  BILITOT 0.7  PROT 7.0  ALBUMIN 2.9*   No results for input(s): LIPASE, AMYLASE in the last 168 hours. No results for input(s): AMMONIA in the last 168 hours. Coagulation Profile: No results for input(s): INR, PROTIME in the last 168 hours. Cardiac Enzymes: No results for input(s): CKTOTAL, CKMB, CKMBINDEX, TROPONINI in the last  168 hours. BNP (last 3 results) No results for input(s): PROBNP in the last 8760 hours. HbA1C: No results for input(s): HGBA1C in the last 72 hours. CBG: Recent Labs  Lab 04/10/21 2337  GLUCAP 170*   Lipid Profile: No results for input(s): CHOL, HDL, LDLCALC, TRIG, CHOLHDL, LDLDIRECT in the last 72 hours. Thyroid Function Tests: No results for input(s): TSH, T4TOTAL, FREET4, T3FREE, THYROIDAB in the last 72 hours. Anemia Panel: Recent Labs    04/08/21 1221  VITAMINB12 >7,500*  FERRITIN 352*  TIBC 252  IRON 79    --------------------------------------------------------------------------------------------------------------- Urine analysis:    Component Value Date/Time   COLORURINE YELLOW 07/09/2020 1223   APPEARANCEUR HAZY (A) 07/09/2020 1223   LABSPEC 1.005 07/09/2020 1223   PHURINE 7.0 07/09/2020 1223   GLUCOSEU 50 (A) 07/09/2020 1223   HGBUR SMALL (A) 07/09/2020 1223   BILIRUBINUR NEGATIVE 07/09/2020 1223   Agency 07/09/2020 1223   PROTEINUR 30 (A) 07/09/2020 1223   NITRITE NEGATIVE 07/09/2020 1223   LEUKOCYTESUR LARGE (A) 07/09/2020 1223      Imaging Results:    US Paracentesis  Result Date: 04/10/2021 INDICATION: Recurrent ascites  EXAM: ULTRASOUND GUIDED LLQ PARACENTESIS MEDICATIONS: 10 cc 1% lidocaine. COMPLICATIONS: None immediate. PROCEDURE: Informed written consent was obtained from the patient after a discussion of the risks, benefits and alternatives to treatment. A timeout was performed prior to the initiation of the procedure. Initial ultrasound scanning demonstrates a large amount of ascites within the left lower abdominal quadrant. The left lower abdomen was prepped and draped in the usual sterile fashion. 1% lidocaine was used for local anesthesia. Following this, a Yueh catheter was introduced. An ultrasound image was saved for documentation purposes. The paracentesis was performed. The catheter was removed and a dressing was applied. The  patient tolerated the procedure well without immediate post procedural complication. Patient received post-procedure intravenous albumin; see nursing notes for details. FINDINGS: A total of approximately 2.4 liters of cloudy yellow fluid was removed. Samples were sent to the laboratory as requested by the clinical team. IMPRESSION: Successful ultrasound-guided paracentesis yielding 2.4 liters of peritoneal fluid. Read by Lavonia Drafts [facility] Electronically Signed   By: Lavonia Dana M.D.   On: 04/10/2021 10:14       Assessment & Plan:    Active Problems:   SBP (spontaneous bacterial peritonitis) ([place])   Mixed hyperlipidemia   Type 2 diabetes mellitus (HCC)   Hypoalbuminemia   Alcoholic cirrhosis of liver (HCC)   Gastroesophageal reflux disease   Hyponatremia   Spontaneous bacterial peritonitis Gram-negative rods growing in peritoneal fluid Patient is on prophylactic Levaquin in the outpatient setting, start and continue Zosyn Patient's last paracentesis was 04/10/2021 This is recurrent SBP Continue to monitor Hypokalemia and hyponatremia Hyponatremia corrects to 132, hypokalemia of 3.3 Patient is a dialysis patient, nephro to see in the a.m. ESRD Nephro to follow Monday Wednesday Friday dialysis schedule Alcoholic cirrhosis With ascites, hypoalbuminemia, anemia, thrombocytopenia Continue rifaximin Continue lactulose Continue Aldactone Continue to monitor Hypertension Continue diltiazem Diabetes mellitus type 2 Patient takes 120 units of Tresiba nightly Reduced dose of basal insulin here, sliding scale coverage Carb modified diet Continue to monitor Hypoalbuminemia Secondary to #4 Encourage nutrient dense food options 1 possible GERD Continue Protonix Hyperlipidemia Continue pravastatin   DVT Prophylaxis-   SCDs   AM Labs Ordered, also please review Full Orders  Family Communication: No family at bedside Code Status: Full  Admission status: Inpatient :The  appropriate admission status for this patient is INPATIENT. Inpatient status is judged to be reasonable and necessary in order to provide the required intensity of service to ensure the patient's safety. The patient's presenting symptoms, physical exam findings, and initial radiographic and laboratory data in the context of their chronic comorbidities is felt to place them at high risk for further clinical deterioration. Furthermore, it is not anticipated that the patient will be medically stable for discharge from the hospital within 2 midnights of admission. The following factors support the admission status of inpatient.     The patient's presenting symptoms include SBP. The worrisome physical exam findings include peripheral edema and ascites. The initial radiographic and laboratory data are worrisome because of pancytopenia, gram-negative rods and peritoneal fluid. The chronic co-morbidities include ESRD, alcoholic cirrhosis, diabetes mellitus type 2, hypertension, hyperlipidemia.       * I certify that at the point of admission it is my clinical judgment that the patient will require inpatient hospital care spanning beyond 2 midnights from the point of admission due to high intensity of service, high risk for further deterioration and high frequency of surveillance required.*  Disposition: Anticipated Discharge 2 to 3 days  discharge to home  Time spent in minutes : [place]

## 2021-04-11 NOTE — Care Management Important Message (Signed)
Important Message  Patient Details  Name: Jeffrey [name] MRN: [id number] Date of Birth: 1945/11/26   Medicare Important Message Given:        [name] 04/11/2021, 2:34 PM

## 2021-04-11 NOTE — Progress Notes (Signed)
Hypoglycemic Event  CBG: 52  Treatment: 8 oz juice/soda  Symptoms: Shaky  Follow-up CBG: [id number] CBG Result:140  Possible Reasons for Event: Inadequate meal intake  Comments/MD notified:Dr. Wynetta Emery notified.    Dorina Hoyer

## 2021-04-11 NOTE — Procedures (Signed)
   HEMODIALYSIS TREATMENT NOTE:   3.5 hour heparin-free Hd session completed via left upper arm AVF (15g/antegrade). Goal met: 3 liters removed.  No interruption in UF.  All blood was returned.  Hemostasis was achieved in 15 minutes.  No changes from pre-HD assessment.  Rockwell Alexandria, RN

## 2021-04-11 NOTE — Consult Note (Signed)
_0 @   Referring Provider: [facility]  Primary Care Physician:  Monico Blitz, MD Primary Gastroenterologist:  Dr. Gala Romney  Date of Admission: 04/10/21 Date of Consultation: 04/11/21  Reason for Consultation:  SBP  HPI:  Jeffrey [name] is a 75 y.o. year old male with history of decompensated end-stage alcoholic cirrhosis complicated by chronic volume overload with diuretics managed by nephrology and requiring paracentesis for ascites, recurrent hepatic encephalopathy due to medication noncompliance, recurrent SBP, on Levaquin at renal doses for SBP prophylaxis, portal hypertensive gastropathy, known grade 1/2 esophageal varices s/p banding and August 2022, over due for surveillance EGD, not on BB due to increased risk of SBP.  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  Clinically, patient reports he has been doing well.  Deniesbdominal pain, fever, chills, nausea, vomiting, brbpr, melena. Abdomen is distended but not too tight. Peripheral edema doing fairly well.  He is compliant with dialysis 3 times a week, lasix and aldactone.  Denies confusion/change in mental status.  Reports sometimes, his hands do shake.  Takes lactulose on days he does not go to dialysis, typically 27m once or twice daily. Sometimes, he will take lactulose after dialysis.  Lactulose causes nausea at times. Typically with 1 BM per day.  Also on Xifaxan twice daily.   ED Course:  Hemodynamically stable. Hemoglobin stable at 8.8, WBC stable at 3.1, platelets stable at 95, sodium 129, potassium 3.3, creatinine stable at 2.89, alk phos elevated at 227 (fairly stable), AST/ALT within normal limits, T bili within normal limits. Lactic acid  1.7. Blood cultures drawn. He was started on IV Zosyn.   Recent endoscopic evaluation: Last EGD 01/16/2021: Portal hypertensive gastropathy, grade 2 esophageal varices with bleeding stigmata s/p banding x4, slight nodularity and congestion involving bulb and second portion of duodenum, nonspecific.  Recommended repeat EGD in 4 weeks.  Colonoscopy 01/16/2021: Portal colopathy, large rectal varices, polypoid mass at splenic flexure biopsy, two 1 cm left colon polyps not removed, left-sided diverticulosis.  Reported patient had bleeding from splenic flexure lesion.  Pathology with acutely inflamed colonic mucosa with localized ulceration and prominent reactive changes. Colonoscopy 01/19/2021 (Dr. CCandis Schatz: 15 mm polyp in proximal descending colon removed piecemeal using hot snare, 6 mm polyp in ascending colon, benign polypoid lesion at splenic flexure which was likely source of bleeding given adherent clot and reported friability and excessive bleeding following previous biopsy, 12 mm polyp in the sigmoid colon, diverticulosis in the sigmoid colon and descending colon, rectal varices.  Other polyps were not removed because of suspected lower risk of malignant transformation and relatively high risk of bleeding. 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   Past Medical History:  Diagnosis Date   A-fib ([facility]    [facility]; never heard anything else about it   Alcoholic cirrhosis ([facility]    patient reports completing hep a and b vaccines in 2006   Anemia    has had 3 units of prbcs 2013   Anxiety    Arthritis    Asymptomatic gallstones    Ultrasound in 2006   B12 deficiency 11/21/2020   C. difficile colitis    Cholelithiasis    Chronic kidney disease    Dialysis M/W/F/Sa   Depression  Diabetes ([place])    Duodenal ulcer with hemorrhage    per patient in 2006 or 2007, records  have been requested   Dyspnea    low oxygen level -  has O2 2 L all day   Esophageal varices ([place])    see [facility]   GERD (gastroesophageal reflux disease)    Heart burn    History of alcohol abuse    quit 05/2013   HOH (hard of hearing)    Hypertension    Liver cirrhosis ([place])    SBP (spontaneous bacterial peritonitis) ([place])    2020, November 2021.    Splenomegaly    Ultrasound in 2006    Past Surgical History:  Procedure Laterality Date   AGILE CAPSULE N/A 09/20/2019   Procedure: AGILE CAPSULE;  Surgeon: Daneil Dolin, MD;  [facility];  Service: Endoscopy;  Laterality: N/A;   APPENDECTOMY     AV FISTULA PLACEMENT Left 08/17/2019   Procedure: ARTERIOVENOUS (AV) FISTULA CREATION VERSES ARTERIOVENOUS GRAFT;  Surgeon: [name], MD;  Location: [facility];  Service: Vascular;  Laterality: Left;   BASCILIC VEIN TRANSPOSITION Left 09/28/2019   Procedure: LEFT SECOND STAGE [place];  Surgeon: [name], MD;  Location: [place];  Service: Vascular;  Laterality: Left;   BIOPSY  02/03/2018   Procedure: BIOPSY;  Surgeon: Daneil Dolin, MD;  [facility];  Service: Endoscopy;;  bx of gastric polyps   BIOPSY  09/19/2019   Procedure: BIOPSY;  Surgeon: Danie Binder, MD;  [facility];  Service: Endoscopy;;   BIOPSY  01/16/2021   Procedure: BIOPSY;  Surgeon: Daneil Dolin, MD;  [facility];  Service: Endoscopy;;   CATARACT EXTRACTION Right    COLONOSCOPY  03/10/2005   Rectal polyp as described above, removed with snare. Left sided  diverticula. The remainder of the colonic mucosa appeared normal. Inflammed polyp on path.   COLONOSCOPY  04/1999   Dr. Thea Silversmith polyps removed,  Path showed hyperplastic   COLONOSCOPY  10/2015   Dr. Britta Mccreedy: diverticulosis, single sessile tubular adenoma 3-68m in size removed from descending colon.    COLONOSCOPY WITH ESOPHAGOGASTRODUODENOSCOPY (EGD)  03/31/2012   RGNF:AOZHYQMAVMs. Colonic  diverticulosis. tubular adenoma colon   COLONOSCOPY WITH ESOPHAGOGASTRODUODENOSCOPY (EGD)  06/2019   FORSYTH: small esophageal varices without high risk stigmata, single large AVM on lesser curvature in gastric body s/p APC ablation, gastric antral and duodenal bulb polyposis. No significant source to explain transfusion dependent anemia. Colonoscopy with portal colopathy and diffuse edema, changes of Cdiff colitis on colonoscopy   COLONOSCOPY WITH PROPOFOL N/A 01/19/2021   Procedure: COLONOSCOPY WITH PROPOFOL;  Surgeon: CDaryel November MD;  [facility];  Service: Gastroenterology;  Laterality: N/A;   COLONOSCOPY WITH PROPOFOL N/A 01/16/2021   Procedure: COLONOSCOPY WITH PROPOFOL;  Surgeon: RDaneil Dolin MD;  [facility];  Service: Endoscopy;  Laterality: N/A;  10:15am, pt is on dialysis   ESOPHAGEAL BANDING N/A 02/03/2018   Procedure: ESOPHAGEAL BANDING;  Surgeon: RDaneil Dolin MD;  [facility];  Service: Endoscopy;  Laterality: N/A;   ESOPHAGEAL BANDING  01/16/2021   Procedure: ESOPHAGEAL BANDING;  Surgeon: RDaneil Dolin MD;  [facility];  Service: Endoscopy;;   ESOPHAGOGASTRODUODENOSCOPY  01/15/2005   Three columns grade 1 to 2 esophageal varices, otherwise normal esophageal mucosa.  Esophagus was not manipulated otherwise./Nodularity of the antrum with overlying erosions, nonspecific finding. Path showed rare H.pylori  ESOPHAGOGASTRODUODENOSCOPY  09/2008   Dr. Gaylene Brooks columns of grade 2-3 esoph varices, only one column was prominent. Portal gastropathy, multiple gastrc polyps at antrum, two were 2cm with black eschar, bulbar polyps, bulbar erosions   ESOPHAGOGASTRODUODENOSCOPY  11/2004   Dr. Brantley Stage 3 esoph varices   ESOPHAGOGASTRODUODENOSCOPY  03/31/2012   RMR: 4 columns(3-GR 2, 1-GR1) non-bleeding esophageal varices, portal gastropathy, small HH, early GAVE, multiple gastric polyps    ESOPHAGOGASTRODUODENOSCOPY (EGD) WITH  PROPOFOL N/A 02/03/2018   Dr. Gala Romney: Esophageal varices, 3 columns grade 1-2.  Portal hypertensive gastropathy.  Multiple gastric polyps, biopsy consistent with hyperplastic.   ESOPHAGOGASTRODUODENOSCOPY (EGD) WITH PROPOFOL N/A 09/19/2019   Fields: grade I and II esophageal varcies, mild portal hypertensive gastropathy, moderate gastritis but no H. pylori, single hyperplastic gastric polyp removed, obvious source for melena/transfusion dependent anemia not identified, may be due to friable gastric and duodenal mucosa in the setting of portal hypertension   ESOPHAGOGASTRODUODENOSCOPY (EGD) WITH PROPOFOL N/A 01/16/2021   Procedure: ESOPHAGOGASTRODUODENOSCOPY (EGD) WITH PROPOFOL;  Surgeon: Daneil Dolin, MD;  [facility];  Service: Endoscopy;  Laterality: N/A;   GASTRIC VARICES BANDING  03/31/2012   Procedure: GASTRIC VARICES BANDING;  Surgeon: Daneil Dolin, MD;  [facility];  Service: Endoscopy;  Laterality: N/A;   GIVENS CAPSULE STUDY N/A 09/21/2019   Poor study with a lot of debris obstructing much of the view of the first approximate 3 hours out of 6 hours.  No obvious source of bleeding identified.  Sequela of bleeding and old blood seen in the form of"whisps" of blood, blood flecks mostly toward the end of the study.   HEMOSTASIS CLIP PLACEMENT  01/19/2021   Procedure: HEMOSTASIS CLIP PLACEMENT;  Surgeon: [name], MD;  Location[facility];  Service: Gastroenterology;;   IR REMOVAL TUN CV CATH W/O [facility]  02/15/2020   POLYPECTOMY  09/19/2019   Procedure: POLYPECTOMY;  Surgeon: Danie Binder, MD;  [facility];  Service: Endoscopy;;   POLYPECTOMY  01/19/2021   Procedure: POLYPECTOMY;  Surgeon: [name], MD;  Location: [facility] ENDOSCOPY;  Service: Gastroenterology;;   UMBILICAL HERNIA REPAIR  2017   exploratory laparotomy, abdominal washout    Prior to Admission medications   Medication Sig Start Date End Date Taking? Authorizing Provider   acetaminophen (TYLENOL) 650 MG CR tablet Take 650 mg by mouth every 8 (eight) hours as needed for pain.    [provider]  Alcohol Swabs (ALCOHOL PREP) 70 % PADS Apply 1 each topically 3 (three) times daily. 01/31/20   [provider]  ALPRAZolam Duanne Moron) 0.5 MG tablet Take 0.5 mg by mouth See admin instructions. Take one tablet (0.5 mg) by mouth on Monday, Wednesday, Friday before dialysis, may also take one tablet (0.5 mg) daily as needed for anxiety. 09/21/20   [provider]  Ascorbic Acid (VITAMIN C) 1000 MG tablet Take 1,000 mg by mouth in the morning.    [provider]  calcium carbonate (TUMS - DOSED IN MG ELEMENTAL CALCIUM) 500 MG chewable tablet Chew 1 tablet by mouth 3 (three) times daily with meals.    [provider]  Calcium Carbonate-Simethicone (ALKA-SELTZER HEARTBURN + GAS) 750-80 MG CHEW Chew 1-2 tablets by mouth 2 (two) times daily as needed (acid reflux).    [provider]  diltiazem (TIAZAC) 120 MG 24 hr capsule Take 120 mg by mouth daily. 05/28/20   [provider]  furosemide (LASIX) 40 MG tablet Take 80 mg by  mouth daily. 12/29/19   [provider]  gabapentin (NEURONTIN) 100 MG capsule TAKE 1 CAPSULE BY MOUTH AT BEDTIME Patient taking differently: Take 100 mg by mouth daily with supper. 05/28/20   Waynetta Sandy, MD  GLUCAGEN HYPOKIT 1 MG SOLR injection Inject 1 mg into the muscle once as needed for low blood sugar. 11/28/20   [provider]  hydrocortisone (ANUSOL-HC) 2.5 % rectal cream Place 1 application rectally 2 (two) times daily. Patient taking differently: Place 1 application rectally 2 (two) times daily as needed for hemorrhoids or anal itching. 03/27/20   Erenest Rasher, PA-C  hydrOXYzine (ATARAX/VISTARIL) 25 MG tablet Take 25 mg by mouth 3 (three) times daily. For itching 11/23/20   [provider]  insulin degludec (TRESIBA FLEXTOUCH) 200 UNIT/ML FlexTouch Pen  Inject 120 Units into the skin at bedtime. When blood sugars are up 02/20/20   Barton Dubois, MD  insulin lispro (HUMALOG) 100 UNIT/ML KwikPen Inject 10-50 Units into the skin 3 (three) times daily with meals. Per sliding scale based on CBG    [provider]  iron polysaccharides (NIFEREX) 150 MG capsule Take 150 mg by mouth daily with supper.    [provider]  lactulose (CONSTULOSE) 10 GM/15ML solution TAKE 45 MLS EVERY MORNING AND TAKE 45 MLS EVERY EVENING Patient taking differently: Take 30 g by mouth See admin instructions. Take 45 mls (30 g) by mouth on Sunday, Tuesday, Thursday, Saturday mornings (non-dialysis days) unless going to MD appointment; take 45 mls (30 g) daily at bedtime 07/16/20   Mahala Menghini, PA-C  Lancet Devices (ADJUSTABLE LANCING DEVICE) MISC Apply 1 each topically daily. 05/23/20   [provider]  levofloxacin (LEVAQUIN) 500 MG tablet TAKE 1 TABLET BY MOUTH THREE TIMES EVERY WEEK AFTER DIALYSIS 03/14/21   Erenest Rasher, PA-C  metoprolol succinate (TOPROL-XL) 25 MG 24 hr tablet Take 1 tablet (25 mg total) by mouth daily. Take at bedtime  on Monday ,Wednesday and Friday due to dialaylisis Patient taking differently: Take 25 mg by mouth See admin instructions. Take one tablet (25 mg) by mouth at bedtime every night and take one tablet (25 mg) in morning on  Sun, Tues, Thurs and Sat (non-dialysis days) 01/06/20   Barton Dubois, MD  midodrine (PROAMATINE) 10 MG tablet Take 1 tablet (10 mg total) by mouth 2 (two) times daily with a meal. 04/06/20   Tat, Shanon Brow, MD  milk thistle 175 MG tablet Take 175 mg by mouth 3 (three) times daily.     [provider]  mirtazapine (REMERON) 30 MG tablet Take 1 tablet (30 mg total) by mouth at bedtime. Patient taking differently: Take 30 mg by mouth daily with supper. 02/10/20   Roxan Hockey, MD  Multiple Vitamin (MULTIVITAMIN WITH MINERALS) TABS tablet Take 1 tablet by mouth in the morning.      [provider]  NOVOFINE 32G X 6 MM MISC Inject into the skin. 01/31/20   [provider]  nystatin (MYCOSTATIN/NYSTOP) powder Apply topically 3 (three) times daily. Into groin area and affected area Patient taking differently: Apply 1 application topically as needed. Into groin area and affected area 01/06/20   Barton Dubois, MD  omega-3 acid ethyl esters (LOVAZA) 1 g capsule Take 2 g by mouth 2 (two) times daily.  04/07/19   [provider]  ondansetron (ZOFRAN) 4 MG tablet Take 4 mg by mouth every 8 (eight) hours as needed for nausea or vomiting.    [provider]  oxyCODONE-acetaminophen (PERCOCET/ROXICET) 5-325 MG tablet Take 1 tablet by mouth every 4 (four) hours as needed for moderate pain. 02/27/20   [provider]  pantoprazole (PROTONIX) 40 MG tablet Take 2 tablets (80 mg total) by mouth daily. 01/21/21   Shelly Coss, MD  Ped Vitamins ACD Fl-Iron (TRI-VIT/FLUORIDE/IRON PO) Take 1 tablet by mouth daily. 08/01/19   [provider]  polyethylene glycol-electrolytes (TRILYTE) 420 g solution Take 4,000 mLs by mouth as directed. 11/19/20   Rourk, [name], MD  pravastatin (PRAVACHOL) 20 MG tablet Take 20 mg by mouth in the morning.     [provider]  Probiotic Product (PROBIOTIC DAILY PO) Take 1 tablet by mouth in the morning.     [provider]  spironolactone (ALDACTONE) 50 MG tablet Take 50 mg by mouth in the morning.     [provider]  thiamine (VITAMIN B-1) 100 MG tablet Take 100 mg by mouth daily. 08/27/20   [provider]  tiZANidine (ZANAFLEX) 2 MG tablet Take 2 mg by mouth 2 (two) times daily as needed for muscle spasms.  02/29/20   [provider]  triamcinolone cream (KENALOG) 0.1 % Apply 1 application topically 2 (two) times daily as needed (dry skin Itching).  12/30/17   [provider]  venlafaxine (EFFEXOR) 75 MG tablet Take 75 mg by mouth 3 (three) times daily with meals.      [provider]  XIFAXAN 550 MG TABS tablet TAKE 1 TABLET BY MOUTH TWICE DAILY 02/13/21   Mahala Menghini, PA-C    Current Facility-Administered Medications  Medication Dose Route Frequency Provider Last Rate Last Admin   acetaminophen (TYLENOL) tablet 650 mg  650 mg Oral Q6H PRN Zierle-Ghosh, Asia B, DO       Or   acetaminophen (TYLENOL) suppository 650 mg  650 mg Rectal Q6H PRN Zierle-Ghosh, Asia B, DO       albumin human 25 % solution 25 g  25 g Intravenous Q6H Erenest Rasher, PA-C 60 mL/hr at 04/11/21 0948 25 g at 04/11/21 0948   [START ON 04/13/2021] albumin human 25 % solution 25 g  25 g Intravenous Q6H Ourania Hamler S, PA-C       diltiazem [facility]) 24 hr capsule 120 mg  120 mg Oral Daily Zierle-Ghosh, Asia B, DO   120 mg at 04/11/21 0819   gabapentin (NEURONTIN) capsule 100 mg  100 mg Oral Q supper Zierle-Ghosh, Asia B, DO       hydrOXYzine (ATARAX/VISTARIL) tablet 25 mg  25 mg Oral TID Zierle-Ghosh, Asia B, DO   25 mg at 04/11/21 0818   [START ON 04/12/2021] insulin detemir (LEVEMIR) injection 10 Units  10 Units Subcutaneous Daily Johnson, Clanford L, MD       [START ON 04/12/2021] lactulose (CHRONULAC) 10 GM/15ML solution 30 g  30 g Oral Once per day on Sun Tue Thu Sat Wynetta Emery, Clanford L, MD       lactulose (CHRONULAC) 10 GM/15ML solution 30 g  30 g Oral QHS Johnson, Clanford L, MD       metoprolol succinate (TOPROL-XL) 24 hr tablet 25 mg  25 mg Oral QHS Zierle-Ghosh, Asia B, DO       [START ON 04/12/2021] metoprolol succinate (TOPROL-XL) 24 hr tablet 25 mg  25 mg Oral Once per day on Sun Tue Thu Sat Johnson, Clanford L, MD       mirtazapine (REMERON) tablet 30 mg  30 mg Oral Q supper Zierle-Ghosh, [place]  B, DO       morphine 2 MG/ML injection 2 mg  2 mg Intravenous Q2H PRN Zierle-Ghosh, Asia B, DO       ondansetron (ZOFRAN) tablet 4 mg  4 mg Oral Q6H PRN Zierle-Ghosh, Asia B, DO       Or   ondansetron (ZOFRAN) injection 4 mg  4 mg Intravenous Q6H PRN Zierle-Ghosh, Asia  B, DO       oxyCODONE (Oxy IR/ROXICODONE) immediate release tablet 5 mg  5 mg Oral Q4H PRN Zierle-Ghosh, Asia B, DO       oxyCODONE-acetaminophen (PERCOCET/ROXICET) 5-325 MG per tablet 1 tablet  1 tablet Oral Q4H PRN Zierle-Ghosh, Asia B, DO       pantoprazole (PROTONIX) EC tablet 80 mg  80 mg Oral Daily Zierle-Ghosh, Asia B, DO   80 mg at 04/11/21 0818   piperacillin-tazobactam (ZOSYN) IVPB 3.375 g  3.375 g Intravenous Q12H Johnson, Clanford L, MD       pravastatin (PRAVACHOL) tablet 20 mg  20 mg Oral q AM Zierle-Ghosh, Asia B, DO   20 mg at 04/11/21 0944   rifaximin (XIFAXAN) tablet 550 mg  550 mg Oral BID Zierle-Ghosh, Asia B, DO   550 mg at 04/11/21 0818   spironolactone (ALDACTONE) tablet 50 mg  50 mg Oral q AM Zierle-Ghosh, Asia B, DO   50 mg at 04/11/21 0945   tiZANidine (ZANAFLEX) tablet 2 mg  2 mg Oral BID PRN Zierle-Ghosh, Asia B, DO       venlafaxine (EFFEXOR) tablet 75 mg  75 mg Oral TID WC Zierle-Ghosh, Asia B, DO   75 mg at 04/11/21 0818    Allergies as of 04/10/2021 - Review Complete 04/10/2021  Allergen Reaction Noted   Tape Itching, Rash, and Other (See Comments) 09/18/2019    Family History  Problem Relation Age of Onset   Colon cancer Father 73       deceased age 8   Breast cancer Sister        deceased   Diabetes Sister    Stroke Mother    Healthy Son    Healthy Daughter    Lung cancer Neg Hx    Ovarian cancer Neg Hx     Social History   Socioeconomic History   Marital status: Married    Spouse name: Not on file   Number of children: 2   Years of education: Not on file   Highest education level: Not on file  Occupational History   Occupation: RETIRED    Employer: SELF EMPLOYED  Tobacco Use   Smoking status: Former    Packs/day: 2.00    Years: 25.00    Pack years: 50.00    Types: Cigarettes    Quit date: 01/28/1986    Years since quitting: 35.2   Smokeless tobacco: Never  Vaping Use   Vaping Use: Never used  Substance and Sexual Activity    Alcohol use: No    Comment: quit in 05/2013   Drug use: No   Sexual activity: Not Currently  Other Topics Concern   Not on file  Social History Narrative   Two step children from second marriage (divorced) who live with him along with some grandchildren.    Social Determinants of Health   Financial Resource Strain: Not on file  Food Insecurity: No Food Insecurity   Worried About Charity fundraiser in the Last Year: Never true   Ran Out of Food in the Last Year: Never true  Transportation Needs: Not on file  Physical Activity: Inactive   Days of Exercise per Week: 0 days   Minutes of Exercise per Session: 0 min  Stress: Not on file  Social Connections: Not on file  Intimate Partner Violence: Not At Risk   Fear of Current or Ex-Partner: No   Emotionally Abused: No   Physically Abused: No   Sexually Abused: No    Review of Systems: Gen: Denies fever, chills, cold or flulike symptoms, presyncope, syncope. CV: Denies chest pain, heart palpitations. Resp: Denies shortness of breath or cough. GI: See HPI GU : Denies urinary burning, urinary frequency, urinary incontinence.  Has a catheter. MS: Denies joint pain. Derm: Denies rash. Psych: Denies depression, anxiety. Heme: See HPI  Physical Exam: Vital signs in last 24 hours: Temp:  [97.8 F (36.6 C)-98.4 F (36.9 C)] 98.4 F (36.9 C) (11/11 0545) Pulse Rate:  [64-75] 75 (11/11 0545) Resp:  [16-20] 19 (11/11 0545) BP: (145-175)/(60-77) 145/69 (11/11 0545) SpO2:  [92 %-100 %] 100 % (11/11 0545) Weight:  [91.4 kg] 91.4 kg (11/10 2345) Last BM Date: 04/09/21 General:   Alert,  well-developed, well-nourished, pleasant and cooperative in NAD.  Head:  Normocephalic and atraumatic. Eyes:  Sclera clear, no icterus.   Conjunctiva pink. Ears:  Normal auditory acuity. Lungs:  Clear throughout to auscultation.   No wheezes, crackles, or rhonchi. No acute distress. Heart:  Regular rate and rhythm; no murmurs, clicks, rubs,  or  gallops. Abdomen: Distended but nontense and nontender.  No masses appreciated.  Normal bowel sounds.  No guarding or rebound.  Rectal:  Deferred  Msk:  Symmetrical without gross deformities. Normal posture. Extremities:  With 1+ right lower extremity edema to mid shin, trace left lower extremity edema.   Neurologic:  Alert and  oriented x4;  grossly normal neurologically.  Slight asterixis on exam. Skin: Ecchymoses on upper extremities, large ecchymosis on right knee. Psych:  Normal mood and affect.  Intake/Output from previous day: 11/10 0701 - 11/11 0700 In: 177.2 [IV Piggyback:177.2] Out: -  Intake/Output this shift: No intake/output data recorded.  Lab Results: Recent Labs    04/08/21 1220 04/10/21 1838 04/11/21 0521  WBC 3.7* 3.1* 3.2*  HGB 9.0* 8.8* 8.8*  HCT 28.7* 28.0* 28.4*  PLT 82* 95* 89*   BMET Recent Labs    04/10/21 1838 04/11/21 0521  NA 129* 132*  K 3.3* 3.3*  CL 91* 95*  CO2 27 27  GLUCOSE 216* 84  BUN 30* 32*  CREATININE 2.89* 3.15*  CALCIUM 8.4* 8.4*   LFT Recent Labs    04/10/21 1838 04/11/21 0521  PROT 7.0 6.0*  ALBUMIN 2.9* 2.6*  AST 20 15  ALT 16 14  ALKPHOS 227* 162*  BILITOT 0.7 1.0   PT/INR Recent Labs    04/11/21 0643  LABPROT 15.6*  INR 1.2   Hepatitis Panel No results for input(s): HEPBSAG, HCVAB, HEPAIGM, HEPBIGM in the last 72 hours. C-Diff No results for input(s): CDIFFTOX in the last 72 hours.  Studies/Results: US Paracentesis  Result Date: 04/10/2021 INDICATION: Recurrent ascites EXAM: ULTRASOUND GUIDED LLQ PARACENTESIS MEDICATIONS: 10 cc 1% lidocaine. COMPLICATIONS: None immediate. PROCEDURE: Informed written consent was obtained from the patient after a discussion of the risks, benefits and alternatives to treatment. A timeout was performed prior to the initiation of the procedure. Initial ultrasound scanning demonstrates a large amount of ascites within the left lower abdominal quadrant. The left lower abdomen  was prepped and draped in the usual sterile  fashion. 1% lidocaine was used for local anesthesia. Following this, a Yueh catheter was introduced. An ultrasound image was saved for documentation purposes. The paracentesis was performed. The catheter was removed and a dressing was applied. The patient tolerated the procedure well without immediate post procedural complication. Patient received post-procedure intravenous albumin; see nursing notes for details. FINDINGS: A total of approximately 2.4 liters of cloudy yellow fluid was removed. Samples were sent to the laboratory as requested by the clinical team. IMPRESSION: Successful ultrasound-guided paracentesis yielding 2.4 liters of peritoneal fluid. Read by Lavonia Drafts [facility] Electronically Signed   By: Lavonia Dana M.D.   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  SBP:  Unclear if gram stain was a contaminant as PMNs are low, culture negative thus far; however, considering his history, elected to treat as if true SBP. Currently on Zosyn and started IV albumin per protocol today.  We are considering today as day 1.  He needs 5 days of antibiotics, 1.5 g/kg of albumin today, max of 100 g, and 1 g/kg of albumin on day 3, max of 100 g.  Clinically, he is doing well.  Denies abdominal  pain, fever, chills. No leukocytosis. His Cr did increase to 3.15 today from 2.89 yesterday. Nephrology on board, planning on routine dialysis today. Would not necessarily recommend repeat paracentesis for diagnostic purposes prior to discharge; however, he may benefit from repeat paracentesis for therapeutic purposes prior to discharge.  If so, he should have labs for fluid analysis.  Decompensated alcoholic cirrhosis:  Abstinent since 2014.  MELD stable at 25. No HE though slight asterixis on exam today, non-compliant with daily home lactulose. He is on Xifaxan BID. Still with ascites, but abdomen is non-tense. May benefit from repeat paracentesis prior to discharge. Peripheral edema is mild, 1+, and stable. Overdue for EGD for variceal surveillance. This will need to be completed outpatient weighing risk versus benefits.  Chronic anemia:  Multifactorial in the setting of chronic disease and intermittent GI bleeding. Hemoglobin is stable at 8.8 today. No overt GI bleeding. Receives B12, Venofer, and Epogen outpatient via hematology and nephrology. Nephrology planning for Epogen today with dialysis. Continue to monitor.   Hypokalemia/hypomagnesemia:  Management per hospitalist.  Plan: Complete 5-day course of IV antibiotics, then transition back to renal doses of Levaquin for SBP prophylaxis. Albumin per SBP protocol.  1.5 g/kg, max of 100 g today followed by 1 g/kg, max of 100 g on day 3. Lactulose 45 mL twice daily.  Titrate to 3 semiformed BMs daily. Xifaxan 550 mg twice daily. Diuretic management per nephrology. Management of hypokalemia/hypomagnesemia per hospitalist. Monitor CBC, HFP, BMP daily. Consider repeat therapeutic paracentesis prior to discharge with fluid analysis.  Ongoing cirrhosis care outpatient. Overdue for EGD, but no indication for procedure to be completed urgently this admission.    LOS: 1 day    04/11/2021, 10:47 AM   [name], PA-C [facility]  Gastroenterology

## 2021-04-11 NOTE — Progress Notes (Signed)
[facility] CARE NOTE   04/11/2021 4:45 PM  Jeffrey [name] was seen and examined.  The H&P by the admitting provider, orders, imaging was reviewed.  Please see new orders.  Will continue to follow.  I have consulted nephrologist and GI to help with inpatient in this medically complex patient.  I have reduced insulin to correct hypoglycemia.  Will follow closely.    Vitals:   04/11/21 1600 04/11/21 1630  BP: 132/63 134/63  Pulse: 69 71  Resp:    Temp:    SpO2:      Results for orders placed or performed during the hospital encounter of 04/10/21  Blood culture (routine x 2)   Specimen: BLOOD RIGHT ARM  Result Value Ref Range   Specimen Description BLOOD RIGHT ARM    Special Requests      Blood Culture adequate volume BOTTLES DRAWN AEROBIC AND ANAEROBIC   Culture      NO GROWTH < 12 HOURS Performed at [facility], [address], [place], [place] [address]    Report Status PENDING   Blood culture (routine x 2)   Specimen: Right Antecubital; Blood  Result Value Ref Range   Specimen Description RIGHT ANTECUBITAL    Special Requests      Blood Culture adequate volume BOTTLES DRAWN AEROBIC AND ANAEROBIC   Culture      NO GROWTH < 12 HOURS Performed at [facility], [address], [place],  [address]    Report Status PENDING   Resp Panel by RT-PCR (Flu A&B, Covid) Nasopharyngeal Swab   Specimen: Nasopharyngeal Swab; Nasopharyngeal(NP) swabs in vial transport medium  Result Value Ref Range   SARS Coronavirus 2 by RT PCR NEGATIVE NEGATIVE   Influenza A by PCR NEGATIVE NEGATIVE   Influenza B by PCR NEGATIVE NEGATIVE  CBC with Differential/Platelet  Result Value Ref Range   WBC 3.1 (L) 4.0 - 10.5 K/uL   RBC 2.66 (L) 4.22 - 5.81 MIL/uL   Hemoglobin 8.8 (L) 13.0 - 17.0 g/dL   HCT 28.0 (L) 39.0 - 52.0 %   MCV 105.3 (H) 80.0 - 100.0 fL   MCH 33.1 26.0 - 34.0 pg   MCHC 31.4 30.0 - 36.0 g/dL   RDW 17.1 (H) 11.5 - 15.5 %   Platelets 95 (L) 150 - 400 K/uL   nRBC 0.0 0.0 -  0.2 %   Neutrophils Relative % 71 %   Neutro Abs 2.2 1.7 - 7.7 K/uL   Lymphocytes Relative 10 %   Lymphs Abs 0.3 (L) 0.7 - 4.0 K/uL   Monocytes Relative 11 %   Monocytes Absolute 0.3 0.1 - 1.0 K/uL   Eosinophils Relative 7 %   Eosinophils Absolute 0.2 0.0 - 0.5 K/uL   Basophils Relative 1 %   Basophils Absolute 0.0 0.0 - 0.1 K/uL   Smear Review MORPHOLOGY UNREMARKABLE    Immature Granulocytes 0 %   Abs Immature Granulocytes 0.01 0.00 - 0.07 K/uL   Polychromasia PRESENT    Ovalocytes PRESENT   Comprehensive metabolic panel  Result Value Ref Range   Sodium 129 (L) 135 - 145 mmol/L   Potassium 3.3 (L) 3.5 - 5.1 mmol/L   Chloride 91 (L) 98 - 111 mmol/L   CO2 27 22 - 32 mmol/L   Glucose, Bld 216 (H) 70 - 99 mg/dL   BUN 30 (H) 8 - 23 mg/dL   Creatinine, Ser 2.89 (H) 0.61 - 1.24 mg/dL   Calcium 8.4 (L) 8.9 - 10.3 mg/dL  Total Protein 7.0 6.5 - 8.1 g/dL   Albumin 2.9 (L) 3.5 - 5.0 g/dL   AST 20 15 - 41 U/L   ALT 16 0 - 44 U/L   Alkaline Phosphatase 227 (H) 38 - 126 U/L   Total Bilirubin 0.7 0.3 - 1.2 mg/dL   GFR, Estimated 22 (L) >60 mL/min   Anion gap 11 5 - 15  Lactic acid, plasma  Result Value Ref Range   Lactic Acid, Venous 1.7 0.5 - 1.9 mmol/L  Glucose, capillary  Result Value Ref Range   Glucose-Capillary 170 (H) 70 - 99 mg/dL  Hemoglobin A1c  Result Value Ref Range   Hgb A1c MFr Bld 6.1 (H) 4.8 - 5.6 %   Mean Plasma Glucose 128.37 mg/dL  Comprehensive metabolic panel  Result Value Ref Range   Sodium 132 (L) 135 - 145 mmol/L   Potassium 3.3 (L) 3.5 - 5.1 mmol/L   Chloride 95 (L) 98 - 111 mmol/L   CO2 27 22 - 32 mmol/L   Glucose, Bld 84 70 - 99 mg/dL   BUN 32 (H) 8 - 23 mg/dL   Creatinine, Ser 3.15 (H) 0.61 - 1.24 mg/dL   Calcium 8.4 (L) 8.9 - 10.3 mg/dL   Total Protein 6.0 (L) 6.5 - 8.1 g/dL   Albumin 2.6 (L) 3.5 - 5.0 g/dL   AST 15 15 - 41 U/L   ALT 14 0 - 44 U/L   Alkaline Phosphatase 162 (H) 38 - 126 U/L   Total Bilirubin 1.0 0.3 - 1.2 mg/dL   GFR,  Estimated 20 (L) >60 mL/min   Anion gap 10 5 - 15  Magnesium  Result Value Ref Range   Magnesium 1.6 (L) 1.7 - 2.4 mg/dL  Phosphorus  Result Value Ref Range   Phosphorus 4.4 2.5 - 4.6 mg/dL  CBC WITH DIFFERENTIAL  Result Value Ref Range   WBC 3.2 (L) 4.0 - 10.5 K/uL   RBC 2.66 (L) 4.22 - 5.81 MIL/uL   Hemoglobin 8.8 (L) 13.0 - 17.0 g/dL   HCT 28.4 (L) 39.0 - 52.0 %   MCV 106.8 (H) 80.0 - 100.0 fL   MCH 33.1 26.0 - 34.0 pg   MCHC 31.0 30.0 - 36.0 g/dL   RDW 16.9 (H) 11.5 - 15.5 %   Platelets 89 (L) 150 - 400 K/uL   nRBC 0.0 0.0 - 0.2 %   Neutrophils Relative % 70 %   Neutro Abs 2.3 1.7 - 7.7 K/uL   Lymphocytes Relative 12 %   Lymphs Abs 0.4 (L) 0.7 - 4.0 K/uL   Monocytes Relative 9 %   Monocytes Absolute 0.3 0.1 - 1.0 K/uL   Eosinophils Relative 8 %   Eosinophils Absolute 0.3 0.0 - 0.5 K/uL   Basophils Relative 1 %   Basophils Absolute 0.0 0.0 - 0.1 K/uL   RBC Morphology MORPHOLOGY UNREMARKABLE    Smear Review MORPHOLOGY UNREMARKABLE    Immature Granulocytes 0 %   Abs Immature Granulocytes 0.00 0.00 - 0.07 K/uL  Protime-INR  Result Value Ref Range   Prothrombin Time 15.6 (H) 11.4 - 15.2 seconds   INR 1.2 0.8 - 1.2  Glucose, capillary  Result Value Ref Range   Glucose-Capillary 63 (L) 70 - 99 mg/dL  Glucose, capillary  Result Value Ref Range   Glucose-Capillary 54 (L) 70 - 99 mg/dL  Glucose, capillary  Result Value Ref Range   Glucose-Capillary 52 (L) 70 - 99 mg/dL  Glucose, capillary  Result Value Ref Range  Glucose-Capillary 140 (H) 70 - 99 mg/dL  Glucose, capillary  Result Value Ref Range   Glucose-Capillary 152 (H) 70 - 99 mg/dL   Time spent 28 mins    Jeffrey Natal, MD [facility]   04/10/2021  5:59 PM How to contact the [facility] Attending or Consulting provider [place] or covering provider during after hours [place], for this patient?  Check the care team in [facility] and look for a) attending/consulting [facility] provider listed and b) the [facility] team listed Log  into [email or web address] and use 's universal password to access. If you do not have the password, please contact the hospital operator. Locate the [facility] provider you are looking for under [facility] and page to a number that you can be directly reached. If you still have difficulty reaching the provider, please page the [facility] (Director on Call) for the Hospitalists listed on amion for assistance.

## 2021-04-11 NOTE — Consult Note (Signed)
ESRD Consult Note   Assessment/Recommendations:   # ESRD:  -outpatient orders: [facility], MWF, 4 hrs, Revaclear 300, 2K, 2.5 Cal, 350/500, EDW 93 kg, no heparin, Epogen 14,000 units q. treatment, Venofer 50 mg q. Mondays -HD today, next HD Monday if he's still here  # SBP, recurrent -On Zosyn  # Alcoholic cirrhosis -on rifaximin, lactulose, Aldactone  # Volume/ hypertension: EDW 93kg. Attempt to achieve EDW as tolerated  # Anemia of Chronic Kidney Disease: Hemoglobin 8.8. Receives epogen 14 units on MWF, will give a dose of ESA here  # Secondary Hyperparathyroidism/Hyperphosphatemia: resume home binders   # Vascular access: LUE AVF  # DM2 -per primary  Hyponatremia -likelt related to ESRD and cirrhosis, managing with HD (137Na bath)  # Additional recommendations: - Dose all meds for creatinine clearance < 10 ml/min  - Unless absolutely necessary, no MRIs with gadolinium.  - Implement save arm precautions.  Prefer needle sticks in the dorsum of the hands or wrists.  No blood pressure measurements in arm. - If blood transfusion is requested during hemodialysis sessions, please alert [place] prior to the session.  - If a hemodialysis catheter line culture is requested, please alert [place] as only hemodialysis nurses are able to collect those specimens.   Jeffrey Quint, MD [facility]  History of Present Illness: Jeffrey [name] is a/an 74 y.o. male with a past medical history of ESRD on HD MWF, alcoholic cirrhosis, A. fib, esophageal varices, GERD, history of SBP, hypertension who presents with abnormal labs.  Went for paracentesis and was told that he had bacteria in his fluid (GNR). He reports feeling in his usual state of health. Has a chronic foley. Denies any issues with HD or AVF. No other complaints.   Medications:  Current Facility-Administered Medications  Medication Dose Route Frequency Provider Last Rate Last Admin   acetaminophen (TYLENOL) tablet 650 mg  650  mg Oral Q6H PRN Zierle-Ghosh, Asia B, DO       Or   acetaminophen (TYLENOL) suppository 650 mg  650 mg Rectal Q6H PRN Zierle-Ghosh, Asia B, DO       albumin human 25 % solution 25 g  25 g Intravenous Q6H Erenest Rasher, PA-C 60 mL/hr at 04/11/21 0948 25 g at 04/11/21 0948   [START ON 04/13/2021] albumin human 25 % solution 25 g  25 g Intravenous Q6H Harper, Kristen S, PA-C       diltiazem [facility]) 24 hr capsule 120 mg  120 mg Oral Daily Zierle-Ghosh, Asia B, DO   120 mg at 04/11/21 0819   gabapentin (NEURONTIN) capsule 100 mg  100 mg Oral Q supper Zierle-Ghosh, Asia B, DO       hydrOXYzine (ATARAX/VISTARIL) tablet 25 mg  25 mg Oral TID Zierle-Ghosh, Asia B, DO   25 mg at 04/11/21 0818   [START ON 04/12/2021] insulin detemir (LEVEMIR) injection 10 Units  10 Units Subcutaneous Daily Johnson, Clanford L, MD       lactulose (CHRONULAC) 10 GM/15ML solution 30 g  30 g Oral BID Harper, Kristen S, PA-C       metoprolol succinate (TOPROL-XL) 24 hr tablet 25 mg  25 mg Oral QHS Zierle-Ghosh, Asia B, DO       [START ON 04/12/2021] metoprolol succinate (TOPROL-XL) 24 hr tablet 25 mg  25 mg Oral Once per day on Sun Tue Thu Sat Johnson, Clanford L, MD       mirtazapine (REMERON) tablet 30 mg  30 mg Oral Q supper  Zierle-Ghosh, Asia B, DO       morphine 2 MG/ML injection 2 mg  2 mg Intravenous Q2H PRN Zierle-Ghosh, Asia B, DO       ondansetron (ZOFRAN) tablet 4 mg  4 mg Oral Q6H PRN Zierle-Ghosh, Asia B, DO       Or   ondansetron (ZOFRAN) injection 4 mg  4 mg Intravenous Q6H PRN Zierle-Ghosh, Asia B, DO       oxyCODONE (Oxy IR/ROXICODONE) immediate release tablet 5 mg  5 mg Oral Q4H PRN Zierle-Ghosh, Asia B, DO       oxyCODONE-acetaminophen (PERCOCET/ROXICET) 5-325 MG per tablet 1 tablet  1 tablet Oral Q4H PRN Zierle-Ghosh, Asia B, DO       pantoprazole (PROTONIX) EC tablet 80 mg  80 mg Oral Daily Zierle-Ghosh, Asia B, DO   80 mg at 04/11/21 0818   piperacillin-tazobactam (ZOSYN) IVPB 3.375 g  3.375 g  Intravenous Q12H Johnson, Clanford L, MD       pravastatin (PRAVACHOL) tablet 20 mg  20 mg Oral q AM Zierle-Ghosh, Asia B, DO   20 mg at 04/11/21 0944   rifaximin (XIFAXAN) tablet 550 mg  550 mg Oral BID Zierle-Ghosh, Asia B, DO   550 mg at 04/11/21 0818   spironolactone (ALDACTONE) tablet 50 mg  50 mg Oral q AM Zierle-Ghosh, Asia B, DO   50 mg at 04/11/21 0945   tiZANidine (ZANAFLEX) tablet 2 mg  2 mg Oral BID PRN Zierle-Ghosh, Asia B, DO       venlafaxine (EFFEXOR) tablet 75 mg  75 mg Oral TID WC Zierle-Ghosh, Asia B, DO   75 mg at 04/11/21 0818     ALLERGIES Tape  MEDICAL HISTORY Past Medical History:  Diagnosis Date   A-fib [facility])    when initially stating dialysis january 2021 at [facility] per spouse; never heard anything else about it   Alcoholic cirrhosis ([place])    patient reports completing hep a and b vaccines in 2006   Anemia    has had 3 units of prbcs 2013   Anxiety    Arthritis    Asymptomatic gallstones    Ultrasound in 2006   B12 deficiency 11/21/2020   C. difficile colitis    Cholelithiasis    Chronic kidney disease    Dialysis M/W/F/Sa   Depression    Diabetes ([place])    Duodenal ulcer with hemorrhage    per patient in 2006 or 2007, records have been requested   Dyspnea    low oxygen level -  has O2 2 L all day   Esophageal varices ([facility])    see PSH   GERD (gastroesophageal reflux disease)    Heart burn    History of alcohol abuse    quit 05/2013   HOH (hard of hearing)    Hypertension    Liver cirrhosis ([place])    SBP (spontaneous bacterial peritonitis) ([place])    2020, November 2021.    Splenomegaly    Ultrasound in 2006     SOCIAL HISTORY Social History   Socioeconomic History   Marital status: Married    Spouse name: Not on file   Number of children: 2   Years of education: Not on file   Highest education level: Not on file  Occupational History   Occupation: RETIRED    Employer: SELF EMPLOYED  Tobacco Use   Smoking status: Former     Packs/day: 2.00    Years: 25.00    Pack years: 50.00  Types: Cigarettes    Quit date: 01/28/1986    Years since quitting: 35.2   Smokeless tobacco: Never  Vaping Use   Vaping Use: Never used  Substance and Sexual Activity   Alcohol use: No    Comment: quit in 05/2013   Drug use: No   Sexual activity: Not Currently  Other Topics Concern   Not on file  Social History Narrative   Two step children from second marriage (divorced) who live with him along with some grandchildren.    Social Determinants of Radio broadcast assistant Strain: Not on file  Food Insecurity: No Food Insecurity   Worried About Charity fundraiser in the Last Year: Never true   Arboriculturist in the Last Year: Never true  Transportation Needs: Not on file  Physical Activity: Inactive   Days of Exercise per Week: 0 days   Minutes of Exercise per Session: 0 min  Stress: Not on file  Social Connections: Not on file  Intimate Partner Violence: Not At Risk   Fear of Current or Ex-Partner: No   Emotionally Abused: No   Physically Abused: No   Sexually Abused: No     FAMILY HISTORY Family History  Problem Relation Age of Onset   Colon cancer Father 24       deceased age 50   Breast cancer Sister        deceased   Diabetes Sister    Stroke Mother    Healthy Son    Healthy Daughter    Lung cancer Neg Hx    Ovarian cancer Neg Hx      Review of Systems: 12 systems were reviewed and negative except per HPI  Physical Exam: Vitals:   04/10/21 2337 04/11/21 0545  BP: (!) 150/67 (!) 145/69  Pulse: 70 75  Resp: 19 19  Temp: 98.2 F (36.8 C) 98.4 F (36.9 C)  SpO2: 100% 100%   No intake/output data recorded.  Intake/Output Summary (Last 24 hours) at 04/11/2021 1100 Last data filed at 04/11/2021 0400 Gross per 24 hour  Intake 177.19 ml  Output --  Net 177.19 ml   General: well-appearing, no acute distress, laying flat in bed HEENT: anicteric sclera, MMM CV: normal rate, no murmurs, no  edema Lungs: bilateral chest rise, normal wob Abd: soft, non-tender, distended Skin: no visible lesions or rashes Psych: alert, engaged, appropriate mood and affect Neuro: normal speech, no gross focal deficits  Dialysis Access: LUE AVF +b/t  Test Results Reviewed Lab Results  Component Value Date   NA 132 (L) 04/11/2021   K 3.3 (L) 04/11/2021   CL 95 (L) 04/11/2021   CO2 27 04/11/2021   BUN 32 (H) 04/11/2021   CREATININE 3.15 (H) 04/11/2021   CALCIUM 8.4 (L) 04/11/2021   ALBUMIN 2.6 (L) 04/11/2021   PHOS 4.4 04/11/2021    I have reviewed relevant outside healthcare records

## 2021-04-12 DIAGNOSIS — E871 Hypo-osmolality and hyponatremia: Secondary | ICD-10-CM | POA: Diagnosis not present

## 2021-04-12 DIAGNOSIS — K652 Spontaneous bacterial peritonitis: Secondary | ICD-10-CM | POA: Diagnosis not present

## 2021-04-12 DIAGNOSIS — K7031 Alcoholic cirrhosis of liver with ascites: Secondary | ICD-10-CM | POA: Diagnosis not present

## 2021-04-12 DIAGNOSIS — K219 Gastro-esophageal reflux disease without esophagitis: Secondary | ICD-10-CM | POA: Diagnosis not present

## 2021-04-12 MED ORDER — NYSTATIN 100000 UNIT/GM EX POWD
Freq: Three times a day (TID) | CUTANEOUS | Status: DC
  Filled 2021-04-12: qty 15

## 2021-04-12 NOTE — Plan of Care (Signed)
?  Problem: Education: ?Goal: Knowledge of General Education information will improve ?Description: Including pain rating scale, medication(s)/side effects and non-pharmacologic comfort measures ?Outcome: Not Progressing ?  ?Problem: Health Behavior/Discharge Planning: ?Goal: Ability to manage health-related needs will improve ?Outcome: Not Progressing ?  ?Problem: Clinical Measurements: ?Goal: Ability to maintain clinical measurements within normal limits will improve ?Outcome: Progressing ?Goal: Will remain free from infection ?Outcome: Progressing ?Goal: Diagnostic test results will improve ?Outcome: Progressing ?Goal: Respiratory complications will improve ?Outcome: Progressing ?Goal: Cardiovascular complication will be avoided ?Outcome: Progressing ?  ?Problem: Activity: ?Goal: Risk for activity intolerance will decrease ?Outcome: Progressing ?  ?Problem: Nutrition: ?Goal: Adequate nutrition will be maintained ?Outcome: Progressing ?  ?Problem: Coping: ?Goal: Level of anxiety will decrease ?Outcome: Progressing ?  ?Problem: Elimination: ?Goal: Will not experience complications related to bowel motility ?Outcome: Progressing ?Goal: Will not experience complications related to urinary retention ?Outcome: Progressing ?  ?Problem: Pain Managment: ?Goal: General experience of comfort will improve ?Outcome: Progressing ?  ?Problem: Safety: ?Goal: Ability to remain free from injury will improve ?Outcome: Progressing ?  ?Problem: Skin Integrity: ?Goal: Risk for impaired skin integrity will decrease ?Outcome: Progressing ?  ?

## 2021-04-12 NOTE — Progress Notes (Signed)
CBG 180 

## 2021-04-12 NOTE — Progress Notes (Signed)
PROGRESS NOTE   Jeffrey [name]  ZOX:[id number] DOB: September 18, 1945 DOA: 04/10/2021 PCP: Monico Blitz, MD   No chief complaint on file.  Level of care: Med-Surg  Brief Admission History:  75 y.o. 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  Patient reports that he got in for a therapeutic paracentesis was apparently normal for him and he was called by his physician and told to come into the ER for bacteria in the fluid.  It looks like the peritoneal fluid grew gram-negative rods.  Patient reports he had otherwise been in his normal state of health.  He denies any fevers.  Admits to mild fatigue.  At this time he feels like his abdomen is less bloated and he has no dyspnea.  He reports that before the paracentesis his abdomen was quite distended with fluid, and he found it more difficult to breathe because of that.  Patient reports that he still does make urine, and has an indwelling Foley.  Patient reports that sometimes the bag is full after 24 hours, other times its not.  He really has not noticed a pattern with that.  Patient reports no associated abdominal pain.  No fevers.  Onset of his fluid buildup was over a couple of weeks, gradual, constant.  Nothing that he notes makes the fluid retention worse.  He reports that dialysis always tells him to restrict his fluids and go on a low-sodium diet.  Patient is frustrated with this because then they tell him his sodium is low.  The only thing that makes the fluid retention better is the therapeutic taps.  Patient has no other complaints at this time.     Assessment & Plan:   Active Problems:   SBP (spontaneous bacterial peritonitis) ([place])   Mixed hyperlipidemia   Type 2 diabetes mellitus (HCC)   Hypoalbuminemia   Alcoholic cirrhosis of  liver (HCC)   Gastroesophageal reflux disease   Hyponatremia  SBP - plan to treat with Zosyn IV x5 days and then resume prophylactic levofloxacin three times weekly with HD  Hypokalemia - repleted  ESRD on HD - he remains on MWF HD schedule  Alcoholic cirrhosis - continue lactulose, rifaximin - continue aldactone  Essential hypertension  - resume home diltiazem  Type 2 DM  - pt reports that he rarely uses insulin at home - DC basal insulin - monitor CBG closely   GERD - stable on home oral protonix  Hyperlipidemia - continue home pravastatin    DVT prophylaxis: SCDs Code Status: FULL  Family Communication: plan of care discussed with patient at bedside  Disposition: home with [facility]  Status is: Inpatient  Remains inpatient appropriate because: IV antibiotics for 5 days per GI service    Consultants:  GI   Procedures:    Antimicrobials:  Zosyn 11/11>>   Subjective: Pt reports that he is feeling well, no complaints, says he usually rarely takes insulin at home.   Objective: Vitals:   04/11/21 1820 04/11/21 2044 04/12/21 0532 04/12/21 1415  BP: 140/66 (!) 147/70 (!) 163/63 139/67  Pulse: 73 79 74 79  Resp: 17 16 18 20   Temp: 98.1 F (36.7 C) 98.5 F (36.9 C) 97.9 F (36.6 C) 98.1 F (36.7 C)  TempSrc: Oral Oral  Oral  SpO2:  100% 96% 100%  Weight:  Height:        Intake/Output Summary (Last 24 hours) at 04/12/2021 1620 Last data filed at 04/12/2021 0700 Gross per 24 hour  Intake 941.99 ml  Output 3310 ml  Net -2368.01 ml   Filed Weights   04/10/21 2345 04/11/21 1425  Weight: 91.4 kg 91.7 kg    Examination:  General exam: Appears calm and comfortable  Respiratory system: Clear to auscultation. Respiratory effort normal. Cardiovascular system: normal S1 & S2 heard. No JVD, murmurs, rubs, gallops or clicks. No pedal edema. Gastrointestinal system: Abdomen is distended, soft and nontender. No organomegaly or masses felt. Normal bowel  sounds heard. Central nervous system: Alert and oriented. No focal neurological deficits. Extremities: Symmetric 5 x 5 power. Skin: No rashes, lesions or ulcers Psychiatry: Judgement and insight appear normal. Mood & affect appropriate.   Data Reviewed: I have personally reviewed following labs and imaging studies  CBC: Recent Labs  Lab 04/08/21 1220 04/10/21 1838 04/11/21 0521  WBC 3.7* 3.1* 3.2*  NEUTROABS 3.1 2.2 2.3  HGB 9.0* 8.8* 8.8*  HCT 28.7* 28.0* 28.4*  MCV 105.9* 105.3* 106.8*  PLT 82* 95* 89*    Basic Metabolic Panel: Recent Labs  Lab 04/10/21 1838 04/11/21 0521  NA 129* 132*  K 3.3* 3.3*  CL 91* 95*  CO2 27 27  GLUCOSE 216* 84  BUN 30* 32*  CREATININE 2.89* 3.15*  CALCIUM 8.4* 8.4*  MG  --  1.6*  PHOS  --  4.4    GFR: Estimated Creatinine Clearance: 22.2 mL/min (A) (by C-G formula based on SCr of 3.15 mg/dL (H)).  Liver Function Tests: Recent Labs  Lab 04/10/21 1838 04/11/21 0521  AST 20 15  ALT 16 14  ALKPHOS 227* 162*  BILITOT 0.7 1.0  PROT 7.0 6.0*  ALBUMIN 2.9* 2.6*    CBG: Recent Labs  Lab 04/11/21 0729 04/11/21 0751 04/11/21 0816 04/11/21 0944 04/11/21 1110  GLUCAP 52* 54* 63* 140* 152*    Recent Results (from the past 240 hour(s))  Culture, body fluid w Gram Stain-bottle     Status: None (Preliminary result)   Collection Time: 04/10/21  9:30 AM   Specimen: Peritoneal Washings  Result Value Ref Range Status   Specimen Description PERITONEAL  Final   Special Requests   Final    BOTTLES DRAWN AEROBIC AND ANAEROBIC Blood Culture results may not be optimal due to an excessive volume of blood received in culture bottles   Culture   Final    NO GROWTH 2 DAYS Performed at [facility], [address], [place], [place] [address]    Report Status PENDING  Incomplete  Gram stain     Status: None   Collection Time: 04/10/21  9:30 AM   Specimen: Peritoneal Washings  Result Value Ref Range Status   Specimen Description PERITONEAL   Final   Special Requests NONE  Final   Gram Stain   Final    GRAM POSITIVE RODS IN CLUSTERS CYTOSPIN SMEAR Gram Stain Report Called to,Read Back By and Verified With: Performed at [facility], [address], [place], [place] [address]    Report Status 04/10/2021 FINAL  Final  Blood culture (routine x 2)     Status: None (Preliminary result)   Collection Time: 04/10/21  6:25 PM   Specimen: Right Antecubital; Blood  Result Value Ref Range Status   Specimen Description RIGHT ANTECUBITAL  Final   Special Requests   Final    Blood Culture adequate volume BOTTLES DRAWN AEROBIC  AND ANAEROBIC   Culture   Final    NO GROWTH 2 DAYS Performed at [facility], [address], [place], [place] [address]    Report Status PENDING  Incomplete  Blood culture (routine x 2)     Status: None (Preliminary result)   Collection Time: 04/10/21  6:38 PM   Specimen: BLOOD RIGHT ARM  Result Value Ref Range Status   Specimen Description BLOOD RIGHT ARM  Final   Special Requests   Final    Blood Culture adequate volume BOTTLES DRAWN AEROBIC AND ANAEROBIC   Culture   Final    NO GROWTH 2 DAYS Performed at [facility], [address] [place], [place] [address]    Report Status PENDING  Incomplete  Resp Panel by RT-PCR (Flu A&B, Covid) Nasopharyngeal Swab     Status: None   Collection Time: 04/10/21  9:20 PM   Specimen: Nasopharyngeal Swab; Nasopharyngeal(NP) swabs in vial transport medium  Result Value Ref Range Status   SARS Coronavirus 2 by RT PCR NEGATIVE NEGATIVE Final    Comment: (NOTE) SARS-CoV-2 target nucleic acids are NOT DETECTED.  The SARS-CoV-2 RNA is generally detectable in upper respiratory specimens during the acute phase of infection. The lowest concentration of SARS-CoV-2 viral copies this assay can detect is 138 copies/mL. A negative result does not preclude SARS-Cov-2 infection and should not be used as the sole basis for treatment or other patient management decisions. A negative  result may occur with  improper specimen collection/handling, submission of specimen other than nasopharyngeal swab, presence of viral mutation(s) within the areas targeted by this assay, and inadequate number of viral copies(<138 copies/mL). A negative result must be combined with clinical observations, patient history, and epidemiological information. The expected result is Negative.  Fact Sheet for Patients:  EntrepreneurPulse.com.au  Fact Sheet for Healthcare Providers:  IncredibleEmployment.be  This test is no t yet approved or cleared by the [place] FDA and  has been authorized for detection and/or diagnosis of SARS-CoV-2 by FDA under an Emergency Use Authorization (EUA). This EUA will remain  in effect (meaning this test can be used) for the duration of the COVID-19 declaration under Section 564(b)(1) of the Act, 21 U.S.C.section 360bbb-3(b)(1), unless the authorization is terminated  or revoked sooner.       Influenza A by PCR NEGATIVE NEGATIVE Final   Influenza B by PCR NEGATIVE NEGATIVE Final    Comment: (NOTE) The Xpert Xpress SARS-CoV-2/FLU/RSV plus assay is intended as an aid in the diagnosis of influenza from Nasopharyngeal swab specimens and should not be used as a sole basis for treatment. Nasal washings and aspirates are unacceptable for Xpert Xpress SARS-CoV-2/FLU/RSV testing.  Fact Sheet for Patients: EntrepreneurPulse.com.au  Fact Sheet for Healthcare Providers: IncredibleEmployment.be  This test is not yet approved or cleared by the [place] FDA and has been authorized for detection and/or diagnosis of SARS-CoV-2 by FDA under an Emergency Use Authorization (EUA). This EUA will remain in effect (meaning this test can be used) for the duration of the COVID-19 declaration under Section 564(b)(1) of the Act, 21 U.S.C. section 360bbb-3(b)(1), unless the authorization is  terminated or revoked.  Performed at [facility], [address], [place], [place] [address]      Radiology Studies: No results found.  Scheduled Meds:  Chlorhexidine Gluconate Cloth  6 each Topical Q0600   darbepoetin (ARANESP) injection - DIALYSIS  150 mcg Intravenous Q Fri-HD   diltiazem  120 mg Oral Daily   gabapentin  100  mg Oral Q supper   hydrOXYzine  25 mg Oral TID   lactulose  30 g Oral BID   metoprolol succinate  25 mg Oral QHS   metoprolol succinate  25 mg Oral Once per day on Sun Tue Thu Sat   mirtazapine  30 mg Oral Q supper   pantoprazole  80 mg Oral Daily   pravastatin  20 mg Oral q AM   rifaximin  550 mg Oral BID   spironolactone  50 mg Oral q AM   venlafaxine  75 mg Oral TID WC   Continuous Infusions:  sodium chloride     sodium chloride     [START ON 04/13/2021] albumin human     piperacillin-tazobactam 3.375 g (04/12/21 1002)     LOS: 2 days   Time spent: 36 mins   Jeffrey Everard Wynetta Emery, MD How to contact the [facility] Attending or Consulting provider [place] or covering provider during after hours [place], for this patient?  Check the care team in [facility] and look for a) attending/consulting TRH provider listed and b) the [facility] team listed Log into [email or web address] and use [place]'s universal password to access. If you do not have the password, please contact the hospital operator. Locate the [facility] provider you are looking for under [facility] and page to a number that you can be directly reached. If you still have difficulty reaching the provider, please page the [facility] (Director on Call) for the Hospitalists listed on amion for assistance.  04/12/2021, 4:20 PM

## 2021-04-13 DIAGNOSIS — K219 Gastro-esophageal reflux disease without esophagitis: Secondary | ICD-10-CM | POA: Diagnosis not present

## 2021-04-13 DIAGNOSIS — K652 Spontaneous bacterial peritonitis: Secondary | ICD-10-CM | POA: Diagnosis not present

## 2021-04-13 DIAGNOSIS — K7031 Alcoholic cirrhosis of liver with ascites: Secondary | ICD-10-CM | POA: Diagnosis not present

## 2021-04-13 DIAGNOSIS — E871 Hypo-osmolality and hyponatremia: Secondary | ICD-10-CM | POA: Diagnosis not present

## 2021-04-13 LAB — RENAL FUNCTION PANEL
Albumin: 3.1 g/dL — ABNORMAL LOW (ref 3.5–5.0)
Anion gap: 10 (ref 5–15)
BUN: 27 mg/dL — ABNORMAL HIGH (ref 8–23)
CO2: 27 mmol/L (ref 22–32)
Calcium: 8.5 mg/dL — ABNORMAL LOW (ref 8.9–10.3)
Chloride: 91 mmol/L — ABNORMAL LOW (ref 98–111)
Creatinine, Ser: 3.35 mg/dL — ABNORMAL HIGH (ref 0.61–1.24)
GFR, Estimated: 19 mL/min — ABNORMAL LOW (ref >60)
Glucose, Bld: 236 mg/dL — ABNORMAL HIGH (ref 70–99)
Phosphorus: 4.7 mg/dL — ABNORMAL HIGH (ref 2.5–4.6)
Potassium: 4.5 mmol/L (ref 3.5–5.1)
Sodium: 128 mmol/L — ABNORMAL LOW (ref 135–145)

## 2021-04-13 LAB — GRAM STAIN

## 2021-04-13 LAB — HEPATITIS B SURFACE ANTIBODY, QUANTITATIVE: Hep B S AB Quant (Post): <3.1 m[IU]/mL — ABNORMAL LOW (ref >9.9)

## 2021-04-13 MED ORDER — CHLORHEXIDINE GLUCONATE CLOTH 2 % EX PADS
6.0000 | MEDICATED_PAD | Freq: Every day | CUTANEOUS | Status: DC
  Administered 2021-04-14 – 2021-04-15 (×2): 6 via TOPICAL

## 2021-04-13 NOTE — Progress Notes (Signed)
PROGRESS NOTE   Jeffrey [name]  QTM:[id number] DOB: [birth year] DOA: 04/10/2021 PCP: Monico Blitz, MD   No chief complaint on file.  Level of care: Med-Surg  Brief Admission History:  75 y.o. 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  Patient reports that he got in for a therapeutic paracentesis was apparently normal for him and he was called by his physician and told to come into the ER for bacteria in the fluid.  It looks like the peritoneal fluid grew gram-negative rods.  Patient reports he had otherwise been in his normal state of health.  He denies any fevers.  Admits to mild fatigue.  At this time he feels like his abdomen is less bloated and he has no dyspnea.  He reports that before the paracentesis his abdomen was quite distended with fluid, and he found it more difficult to breathe because of that.  Patient reports that he still does make urine, and has an indwelling Foley.  Patient reports that sometimes the bag is full after 24 hours, other times its not.  He really has not noticed a pattern with that.  Patient reports no associated abdominal pain.  No fevers.  Onset of his fluid buildup was over a couple of weeks, gradual, constant.  Nothing that he notes makes the fluid retention worse.  He reports that dialysis always tells him to restrict his fluids and go on a low-sodium diet.  Patient is frustrated with this because then they tell him his sodium is low.  The only thing that makes the fluid retention better is the therapeutic taps.  Patient has no other complaints at this time.     Assessment & Plan:   Active Problems:   SBP (spontaneous bacterial peritonitis) ([place])   Mixed hyperlipidemia   Type 2 diabetes mellitus (HCC)   Hypoalbuminemia   Alcoholic cirrhosis of  liver (HCC)   Gastroesophageal reflux disease   Hyponatremia  SBP - plan to treat with Zosyn IV x5 days and then resume prophylactic levofloxacin three times weekly with HD - plan to DC home after completing 5 days of IV antibiotics.   Hypokalemia - repleted  ESRD on HD - he remains on MWF HD schedule  Alcoholic cirrhosis - continue lactulose, rifaximin - continue aldactone  Essential hypertension  - resume home diltiazem  Type 2 DM with hypoglycemia - pt reports that he rarely uses insulin at home - DC basal insulin - monitor CBG  GERD - stable on home oral protonix  Hyperlipidemia - continue home pravastatin    DVT prophylaxis: SCDs Code Status: FULL  Family Communication: plan of care discussed with patient at bedside  Disposition: home with [facility]  Status is: Inpatient  Remains inpatient appropriate because: IV antibiotics for 5 days per GI service    Consultants:  GI   Procedures:    Antimicrobials:  Zosyn 11/11>>   Subjective: Pt without complaints. No abdominal pain, no fever or chills.    Objective: Vitals:   04/12/21 1415 04/12/21 2059 04/13/21 0520 04/13/21 0923  BP: 139/67 (!) 168/72 (!) 143/65 (!) 144/63  Pulse: 79 84 73 73  Resp: 20 19 18    Temp: 98.1 F (36.7 C) 98.6 F (37 C) 97.9 F (36.6 C) 97.9 F (36.6 C)  TempSrc: Oral   Oral  SpO2: 100% 100% 100%  100%  Weight:      Height:        Intake/Output Summary (Last 24 hours) at 04/13/2021 1026 Last data filed at 04/13/2021 0559 Gross per 24 hour  Intake 435.97 ml  Output 150 ml  Net 285.97 ml   Filed Weights   04/10/21 2345 04/11/21 1425  Weight: 91.4 kg 91.7 kg    Examination:  General exam: Appears calm and comfortable  Respiratory system: Clear to auscultation. Respiratory effort normal. Cardiovascular system: normal S1 & S2 heard. No JVD, murmurs, rubs, gallops or clicks. No pedal edema. Gastrointestinal system: Abdomen is distended, soft and nontender. No  organomegaly or masses felt. Normal bowel sounds heard. Central nervous system: Alert and oriented. No focal neurological deficits. Extremities: Symmetric 5 x 5 power. Skin: No rashes, lesions or ulcers Psychiatry: Judgement and insight appear normal. Mood & affect appropriate.   Data Reviewed: I have personally reviewed following labs and imaging studies  CBC: Recent Labs  Lab 04/08/21 1220 04/10/21 1838 04/11/21 0521  WBC 3.7* 3.1* 3.2*  NEUTROABS 3.1 2.2 2.3  HGB 9.0* 8.8* 8.8*  HCT 28.7* 28.0* 28.4*  MCV 105.9* 105.3* 106.8*  PLT 82* 95* 89*    Basic Metabolic Panel: Recent Labs  Lab 04/10/21 1838 04/11/21 0521 04/13/21 0351  NA 129* 132* 128*  K 3.3* 3.3* 4.5  CL 91* 95* 91*  CO2 27 27 27   GLUCOSE 216* 84 236*  BUN 30* 32* 27*  CREATININE 2.89* 3.15* 3.35*  CALCIUM 8.4* 8.4* 8.5*  MG  --  1.6*  --   PHOS  --  4.4 4.7*    GFR: Estimated Creatinine Clearance: 20.9 mL/min (A) (by C-G formula based on SCr of 3.35 mg/dL (H)).  Liver Function Tests: Recent Labs  Lab 04/10/21 1838 04/11/21 0521 04/13/21 0351  AST 20 15  --   ALT 16 14  --   ALKPHOS 227* 162*  --   BILITOT 0.7 1.0  --   PROT 7.0 6.0*  --   ALBUMIN 2.9* 2.6* 3.1*    CBG: Recent Labs  Lab 04/11/21 0729 04/11/21 0751 04/11/21 0816 04/11/21 0944 04/11/21 1110  GLUCAP 52* 54* 63* 140* 152*    Recent Results (from the past 240 hour(s))  Culture, body fluid w Gram Stain-bottle     Status: None (Preliminary result)   Collection Time: 04/10/21  9:30 AM   Specimen: Peritoneal Washings  Result Value Ref Range Status   Specimen Description PERITONEAL  Final   Special Requests   Final    BOTTLES DRAWN AEROBIC AND ANAEROBIC Blood Culture results may not be optimal due to an excessive volume of blood received in culture bottles   Culture   Final    NO GROWTH 3 DAYS Performed at [facility], [address] [place], [place] [address]    Report Status PENDING  Incomplete  Gram stain      Status: None   Collection Time: 04/10/21  9:30 AM   Specimen: Peritoneal Washings  Result Value Ref Range Status   Specimen Description PERITONEAL  Final   Special Requests NONE  Final   Gram Stain   Final    GRAM POSITIVE RODS IN CLUSTERS CYTOSPIN SMEAR Gram Stain Report Called to,Read Back By and Verified With: Performed at [facility], [address] [place], [place] [address]    Report Status 04/10/2021 FINAL  Final  Blood culture (routine x 2)     Status: None (Preliminary result)   Collection Time: 04/10/21  6:25 PM   Specimen: Right Antecubital; Blood  Result Value Ref Range Status   Specimen Description RIGHT ANTECUBITAL  Final   Special Requests   Final    Blood Culture adequate volume BOTTLES DRAWN AEROBIC AND ANAEROBIC   Culture   Final    NO GROWTH 3 DAYS Performed at [facility], [address] [place], [place] [address]    Report Status PENDING  Incomplete  Blood culture (routine x 2)     Status: None (Preliminary result)   Collection Time: 04/10/21  6:38 PM   Specimen: BLOOD RIGHT ARM  Result Value Ref Range Status   Specimen Description BLOOD RIGHT ARM  Final   Special Requests   Final    Blood Culture adequate volume BOTTLES DRAWN AEROBIC AND ANAEROBIC   Culture   Final    NO GROWTH 3 DAYS Performed at [facility], [address] [place], [place] [address]    Report Status PENDING  Incomplete  Resp Panel by RT-PCR (Flu A&B, Covid) Nasopharyngeal Swab     Status: None   Collection Time: 04/10/21  9:20 PM   Specimen: Nasopharyngeal Swab; Nasopharyngeal(NP) swabs in vial transport medium  Result Value Ref Range Status   SARS Coronavirus 2 by RT PCR NEGATIVE NEGATIVE Final    Comment: (NOTE) SARS-CoV-2 target nucleic acids are NOT DETECTED.  The SARS-CoV-2 RNA is generally detectable in upper respiratory specimens during the acute phase of infection. The lowest concentration of SARS-CoV-2 viral copies this assay can detect is 138 copies/mL. A negative  result does not preclude SARS-Cov-2 infection and should not be used as the sole basis for treatment or other patient management decisions. A negative result may occur with  improper specimen collection/handling, submission of specimen other than nasopharyngeal swab, presence of viral mutation(s) within the areas targeted by this assay, and inadequate number of viral copies(<138 copies/mL). A negative result must be combined with clinical observations, patient history, and epidemiological information. The expected result is Negative.  Fact Sheet for Patients:  EntrepreneurPulse.com.au  Fact Sheet for Healthcare Providers:  IncredibleEmployment.be  This test is no t yet approved or cleared by the [place] FDA and  has been authorized for detection and/or diagnosis of SARS-CoV-2 by FDA under an Emergency Use Authorization (EUA). This EUA will remain  in effect (meaning this test can be used) for the duration of the COVID-19 declaration under Section 564(b)(1) of the Act, 21 U.S.C.section 360bbb-3(b)(1), unless the authorization is terminated  or revoked sooner.       Influenza A by PCR NEGATIVE NEGATIVE Final   Influenza B by PCR NEGATIVE NEGATIVE Final    Comment: (NOTE) The Xpert Xpress SARS-CoV-2/FLU/RSV plus assay is intended as an aid in the diagnosis of influenza from Nasopharyngeal swab specimens and should not be used as a sole basis for treatment. Nasal washings and aspirates are unacceptable for Xpert Xpress SARS-CoV-2/FLU/RSV testing.  Fact Sheet for Patients: EntrepreneurPulse.com.au  Fact Sheet for Healthcare Providers: IncredibleEmployment.be  This test is not yet approved or cleared by the [place] FDA and has been authorized for detection and/or diagnosis of SARS-CoV-2 by FDA under an Emergency Use Authorization (EUA). This EUA will remain in effect (meaning this test can be used)  for the duration of the COVID-19 declaration under Section 564(b)(1) of the Act, 21 U.S.C. section 360bbb-3(b)(1), unless the authorization is terminated or revoked.  Performed at [facility], [address], [place], [place] [address]      Radiology Studies: No results  found.  Scheduled Meds:  Chlorhexidine Gluconate Cloth  6 each Topical Q0600   darbepoetin (ARANESP) injection - DIALYSIS  150 mcg Intravenous Q Fri-HD   diltiazem  120 mg Oral Daily   gabapentin  100 mg Oral Q supper   hydrOXYzine  25 mg Oral TID   lactulose  30 g Oral BID   metoprolol succinate  25 mg Oral QHS   metoprolol succinate  25 mg Oral Once per day on Sun Tue Thu Sat   mirtazapine  30 mg Oral Q supper   nystatin   Topical TID   pantoprazole  80 mg Oral Daily   pravastatin  20 mg Oral q AM   rifaximin  550 mg Oral BID   spironolactone  50 mg Oral q AM   venlafaxine  75 mg Oral TID WC   Continuous Infusions:  sodium chloride     sodium chloride     albumin human 25 g (04/13/21 0558)   piperacillin-tazobactam 3.375 g (04/13/21 0940)     LOS: 3 days   Time spent: 33 mins   Khelani Kops Wynetta Emery, MD How to contact the [facility] Attending or Consulting provider [place] or covering provider during after hours [place], for this patient?  Check the care team in [facility] and look for a) attending/consulting TRH provider listed and b) the [facility] team listed Log into [email or web address] and use [place]'s universal password to access. If you do not have the password, please contact the hospital operator. Locate the [facility] provider you are looking for under [facility] and page to a number that you can be directly reached. If you still have difficulty reaching the provider, please page the [facility] (Director on Call) for the Hospitalists listed on amion for assistance.  04/13/2021, 10:26 AM

## 2021-04-13 NOTE — Evaluation (Signed)
Physical Therapy Evaluation Patient Details Name: Jeffrey [name] MRN: [id number] DOB: July 31, 1945 Today's Date: 04/13/2021  History of Present Illness  Jeffrey [name]  is a 75 y.o. 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  Patient reports that he got in for a therapeutic paracentesis was apparently normal for him and he was called by his physician and told to come into the ER for bacteria in the fluid.  It looks like the peritoneal fluid grew gram-negative rods.  Patient reports he had otherwise been in his normal state of health.  He denies any fevers.  Admits to mild fatigue.  At this time he feels like his abdomen is less bloated and he has no dyspnea.  He reports that before the paracentesis his abdomen was quite distended with fluid, and he found it more difficult to breathe because of that.  Patient reports that he still does make urine, and has an indwelling Foley.  Patient reports that sometimes the bag is full after 24 hours, other times its not.  He really has not noticed a pattern with that.  Patient reports no associated abdominal pain.  No fevers.  Onset of his fluid buildup was over a couple of weeks, gradual, constant.  Nothing that he notes makes the fluid retention worse.  He reports that dialysis always tells him to restrict his fluids and go on a low-sodium diet.  Patient is frustrated with this because then they tell him his sodium is low.  The only thing that makes the fluid retention better is the therapeutic taps.  Patient has no other complaints at this time.   Clinical Impression   Patient demonstrates bed mobility and transfer performance consistent with his baseline as he is able to perform these activities independently.  Pt able to ambulate throughout hospital room  using RW and modified independence.  Main limitation manifest as decrease in activity tolerance with ambulation of longer distances where he is able to maintain modified independence using a RW but limited by fatigue after 75 ft.  No adverse response other than fatigue noted.  Pt encouraged to ambulate during course of hospital stay with [facility] staff for monitoring to enable longer distance/activity level.  Patient discharged to care of nursing for ambulation daily as tolerated for length of stay.        Recommendations for follow up therapy are one component of a multi-disciplinary discharge planning process, led by the attending physician.  Recommendations may be updated based on patient status, additional functional criteria and insurance authorization.  Follow Up Recommendations No PT follow up    Assistance Recommended at Discharge Set up Supervision/Assistance  Functional Status Assessment Patient has had a recent decline in their functional status and demonstrates the ability to make significant improvements in function in a reasonable and predictable amount of time.  Equipment Recommendations       Recommendations for Other Services       Precautions / Restrictions Restrictions Weight Bearing Restrictions: No      Mobility  Bed Mobility Overal bed mobility: Independent               Patient Response: Cooperative  Transfers Overall transfer level: Modified independent                      Ambulation/Gait Ambulation/Gait assistance:  Modified independent (Device/Increase time) Gait Distance (Feet): 75 Feet Assistive device: Rolling walker (2 wheels) Gait Pattern/deviations: WFL(Within Functional Limits)          Stairs            Wheelchair Mobility    Modified Rankin (Stroke Patients Only)       Balance Overall balance assessment: Modified Independent                                           Pertinent Vitals/Pain Pain  Assessment: No/denies pain    Home Living Family/patient expects to be discharged to:: Private residence Living Arrangements: Spouse/significant other Available Help at Discharge: Family;Available PRN/intermittently Type of Home: House Home Access: Ramped entrance       Home Layout: Able to live on main level with bedroom/bathroom Home Equipment: Rolling Walker (2 wheels);Rollator (4 wheels);Cane - single point;Shower seat - built in;Grab bars - tub/shower;BSC/3in1      Prior Function Prior Level of Function : Independent/Modified Independent                     Hand Dominance   Dominant Hand: Right    Extremity/Trunk Assessment   Upper Extremity Assessment Upper Extremity Assessment: Overall WFL for tasks assessed    Lower Extremity Assessment Lower Extremity Assessment: Overall WFL for tasks assessed       Communication   Communication: No difficulties  Cognition Arousal/Alertness: Awake/alert Behavior During Therapy: WFL for tasks assessed/performed Overall Cognitive Status: Within Functional Limits for tasks assessed                                          General Comments      Exercises     Assessment/Plan    PT Assessment Patient does not need any further PT services  PT Problem List         PT Treatment Interventions      PT Goals (Current goals can be found in the Care Plan section)  Acute Rehab PT Goals Patient Stated Goal: "return home" PT Goal Formulation: With patient Time For Goal Achievement: 04/13/21 Potential to Achieve Goals: Good    Frequency     Barriers to discharge        Co-evaluation               AM-PAC PT "6 Clicks" Mobility  Outcome Measure Help needed turning from your back to your side while in a flat bed without using bedrails?: None Help needed moving from lying on your back to sitting on the side of a flat bed without using bedrails?: None Help needed moving to and from a bed to a  chair (including a wheelchair)?: None Help needed standing up from a chair using your arms (e.g., wheelchair or bedside chair)?: None Help needed to walk in hospital room?: None Help needed climbing 3-5 steps with a railing? : A Little 6 Click Score: 23    End of Session   Activity Tolerance: Patient limited by fatigue Patient left: in bed Nurse Communication: Mobility status PT Visit Diagnosis: Other abnormalities of gait and mobility (R26.89);Difficulty in walking, not elsewhere classified (R26.2)    Time: 1000-1025 PT Time Calculation (min) (ACUTE ONLY): 25 min   Charges:   PT Evaluation $PT  Eval Low Complexity: 1 Low PT Treatments $Gait Training: 8-22 mins       11:18 AM, 04/13/21 M. Sherlyn Lees, PT, DPT Physical Therapist- [place] Office Number: [phone]

## 2021-04-14 DIAGNOSIS — E871 Hypo-osmolality and hyponatremia: Secondary | ICD-10-CM | POA: Diagnosis not present

## 2021-04-14 DIAGNOSIS — K7031 Alcoholic cirrhosis of liver with ascites: Secondary | ICD-10-CM | POA: Diagnosis not present

## 2021-04-14 DIAGNOSIS — K219 Gastro-esophageal reflux disease without esophagitis: Secondary | ICD-10-CM | POA: Diagnosis not present

## 2021-04-14 DIAGNOSIS — K652 Spontaneous bacterial peritonitis: Secondary | ICD-10-CM | POA: Diagnosis not present

## 2021-04-14 LAB — RENAL FUNCTION PANEL
Albumin: 3.7 g/dL (ref 3.5–5.0)
Anion gap: 11 (ref 5–15)
BUN: 33 mg/dL — ABNORMAL HIGH (ref 8–23)
CO2: 26 mmol/L (ref 22–32)
Calcium: 8.7 mg/dL — ABNORMAL LOW (ref 8.9–10.3)
Chloride: 88 mmol/L — ABNORMAL LOW (ref 98–111)
Creatinine, Ser: 3.88 mg/dL — ABNORMAL HIGH (ref 0.61–1.24)
GFR, Estimated: 16 mL/min — ABNORMAL LOW (ref >60)
Glucose, Bld: 204 mg/dL — ABNORMAL HIGH (ref 70–99)
Phosphorus: 5.1 mg/dL — ABNORMAL HIGH (ref 2.5–4.6)
Potassium: 4.1 mmol/L (ref 3.5–5.1)
Sodium: 125 mmol/L — ABNORMAL LOW (ref 135–145)

## 2021-04-14 LAB — GLUCOSE, CAPILLARY
Glucose-Capillary: 79 mg/dL (ref 70–99)
Glucose-Capillary: 181 mg/dL — ABNORMAL HIGH (ref 70–99)
Glucose-Capillary: 178 mg/dL — ABNORMAL HIGH (ref 70–99)
Glucose-Capillary: 189 mg/dL — ABNORMAL HIGH (ref 70–99)
Glucose-Capillary: 233 mg/dL — ABNORMAL HIGH (ref 70–99)
Glucose-Capillary: 256 mg/dL — ABNORMAL HIGH (ref 70–99)
Glucose-Capillary: 214 mg/dL — ABNORMAL HIGH (ref 70–99)
Glucose-Capillary: 196 mg/dL — ABNORMAL HIGH (ref 70–99)
Glucose-Capillary: 285 mg/dL — ABNORMAL HIGH (ref 70–99)
Glucose-Capillary: 237 mg/dL — ABNORMAL HIGH (ref 70–99)
Glucose-Capillary: 210 mg/dL — ABNORMAL HIGH (ref 70–99)
Glucose-Capillary: 200 mg/dL — ABNORMAL HIGH (ref 70–99)
Glucose-Capillary: 235 mg/dL — ABNORMAL HIGH (ref 70–99)
Glucose-Capillary: 253 mg/dL — ABNORMAL HIGH (ref 70–99)
Glucose-Capillary: 72 mg/dL (ref 70–99)

## 2021-04-14 LAB — HEPATIC FUNCTION PANEL
ALT: 26 U/L (ref 0–44)
AST: 36 U/L (ref 15–41)
Albumin: 3.4 g/dL — ABNORMAL LOW (ref 3.5–5.0)
Alkaline Phosphatase: 191 U/L — ABNORMAL HIGH (ref 38–126)
Bilirubin, Direct: 0.4 mg/dL — ABNORMAL HIGH (ref 0.0–0.2)
Indirect Bilirubin: 0.6 mg/dL (ref 0.3–0.9)
Total Bilirubin: 1 mg/dL (ref 0.3–1.2)
Total Protein: 6.8 g/dL (ref 6.5–8.1)

## 2021-04-14 LAB — CBC
HCT: 27.8 % — ABNORMAL LOW (ref 39.0–52.0)
Hemoglobin: 8.9 g/dL — ABNORMAL LOW (ref 13.0–17.0)
MCH: 33.5 pg (ref 26.0–34.0)
MCHC: 32 g/dL (ref 30.0–36.0)
MCV: 104.5 fL — ABNORMAL HIGH (ref 80.0–100.0)
Platelets: 73 10*3/uL — ABNORMAL LOW (ref 150–400)
RBC: 2.66 MIL/uL — ABNORMAL LOW (ref 4.22–5.81)
RDW: 16.7 % — ABNORMAL HIGH (ref 11.5–15.5)
WBC: 4.1 10*3/uL (ref 4.0–10.5)
nRBC: 0 % (ref 0.0–0.2)

## 2021-04-14 LAB — PROTIME-INR
INR: 1.3 — ABNORMAL HIGH (ref 0.8–1.2)
Prothrombin Time: 15.8 seconds — ABNORMAL HIGH (ref 11.4–15.2)

## 2021-04-14 MED ORDER — INSULIN ASPART 100 UNIT/ML IJ SOLN
0.0000 [IU] | Freq: Three times a day (TID) | INTRAMUSCULAR | Status: DC
  Administered 2021-04-14: 3 [IU] via SUBCUTANEOUS
  Administered 2021-04-15: 4 [IU] via SUBCUTANEOUS
  Administered 2021-04-15: 2 [IU] via SUBCUTANEOUS

## 2021-04-14 MED ORDER — INSULIN ASPART 100 UNIT/ML IJ SOLN
0.0000 [IU] | Freq: Three times a day (TID) | INTRAMUSCULAR | Status: DC
  Administered 2021-04-14: 3 [IU] via SUBCUTANEOUS

## 2021-04-14 NOTE — Progress Notes (Signed)
Subjective:  Seen on HD -   very talkative-  no abd pain-  trying for 3000 UF with HD Objective Vital signs in last 24 hours: Vitals:   04/13/21 0923 04/13/21 1514 04/13/21 2055 04/14/21 0930  BP: (!) 144/63 (!) 138/59 (!) 141/64 140/70  Pulse: 73 73 73 76  Resp:   19 18  Temp: 97.9 F (36.6 C) 98 F (36.7 C) 98.1 F (36.7 C) 98.4 F (36.9 C)  TempSrc: Oral Oral Oral Oral  SpO2: 100% 100% 100% 100%  Weight:    93 kg  Height:       Weight change:   Intake/Output Summary (Last 24 hours) at 04/14/2021 0939 Last data filed at 04/14/2021 0555 Gross per 24 hour  Intake 51.24 ml  Output 200 ml  Net -148.76 ml   -outpatient orders: [facility], MWF, 4 hrs, Revaclear 300, 2K, 2.5 Cal, 350/500, EDW 93 kg, no heparin, AVF on left Epogen 14,000 units q. treatment, Venofer 50 mg q. Mondays   Assessment/ Plan: Pt is a 75 y.o. yo male with ESRD and Cirrhosis who was admitted on 04/10/2021 with ?possible SBP detected after a LVP   Assessment/Plan: 1. ?SBP-  thinking possible contaminant but GI wanting to treat with iv zosyn for 5 days-  would be done tomorrow-  then continue levaquin 2. ESRD - normally MWF via AVF- HD today and next would be Wednesday -  hopefully can be at his OP unit  3. Anemia-  on Darbe 150 q week-  given on Friday-  had a recent GIB as well  4. Secondary hyperparathyroidism-  is not on any bone meds as OP it seems-  except for maybe tums-  calc/phos OK here and no recent PTH 5. HTN/volume-  on diltiazem, toprol and aldactone-  but BP seems high enough today to try for at least 3000 of UF-  sodium low due to cirrhosis and volume overload-  I reiterated fluid restriction  6. Dispo-   hopeful for discharge tomorrow so can go to OP HD on Wednesday   Jeffrey [name] A Jeffrey [name]    Labs: Basic Metabolic Panel: Recent Labs  Lab 04/11/21 0521 04/13/21 0351 04/14/21 0446  NA 132* 128* 125*  K 3.3* 4.5 4.1  CL 95* 91* 88*  CO2 27 27 26   GLUCOSE 84 236* 204*  BUN 32* 27*  33*  CREATININE 3.15* 3.35* 3.88*  CALCIUM 8.4* 8.5* 8.7*  PHOS 4.4 4.7* 5.1*   Liver Function Tests: Recent Labs  Lab 04/10/21 1838 04/11/21 0521 04/13/21 0351 04/14/21 0446  AST 20 15  --   --   ALT 16 14  --   --   ALKPHOS 227* 162*  --   --   BILITOT 0.7 1.0  --   --   PROT 7.0 6.0*  --   --   ALBUMIN 2.9* 2.6* 3.1* 3.7   No results for input(s): LIPASE, AMYLASE in the last 168 hours. No results for input(s): AMMONIA in the last 168 hours. CBC: Recent Labs  Lab 04/08/21 1220 04/10/21 1838 04/11/21 0521 04/14/21 0446  WBC 3.7* 3.1* 3.2* 4.1  NEUTROABS 3.1 2.2 2.3  --   HGB 9.0* 8.8* 8.8* 8.9*  HCT 28.7* 28.0* 28.4* 27.8*  MCV 105.9* 105.3* 106.8* 104.5*  PLT 82* 95* 89* 73*   Cardiac Enzymes: No results for input(s): CKTOTAL, CKMB, CKMBINDEX, TROPONINI in the last 168 hours. CBG: Recent Labs  Lab 04/11/21 0729 04/11/21 0751 04/11/21 0816 04/11/21 0944 04/11/21 1110  GLUCAP 52* 54* 63* 140* 152*    Iron Studies: No results for input(s): IRON, TIBC, TRANSFERRIN, FERRITIN in the last 72 hours. Studies/Results: No results found. Medications: Infusions:  sodium chloride     sodium chloride     piperacillin-tazobactam Stopped (04/14/21 0200)    Scheduled Medications:  Chlorhexidine Gluconate Cloth  6 each Topical Q0600   Chlorhexidine Gluconate Cloth  6 each Topical Q0600   darbepoetin (ARANESP) injection - DIALYSIS  150 mcg Intravenous Q Fri-HD   diltiazem  120 mg Oral Daily   gabapentin  100 mg Oral Q supper   hydrOXYzine  25 mg Oral TID   insulin aspart  0-6 Units Subcutaneous TID WC   lactulose  30 g Oral BID   metoprolol succinate  25 mg Oral QHS   metoprolol succinate  25 mg Oral Once per day on Sun Tue Thu Sat   mirtazapine  30 mg Oral Q supper   nystatin   Topical TID   pantoprazole  80 mg Oral Daily   pravastatin  20 mg Oral q AM   rifaximin  550 mg Oral BID   spironolactone  50 mg Oral q AM   venlafaxine  75 mg Oral TID WC    have  reviewed scheduled and prn medications.  Physical Exam: General: NAD Heart: RRR Lungs: mostly clear Abdomen: obese, distended Extremities: pitting dep edema Dialysis Access: left AVF     04/14/2021,9:39 AM  LOS: 4 days

## 2021-04-14 NOTE — Progress Notes (Signed)
PROGRESS NOTE   Jeffrey [name]  OEU:[id number] DOB: 11-14-1945 DOA: 04/10/2021 PCP: Monico Blitz, MD   No chief complaint on file.  Level of care: Med-Surg  Brief Admission History:  75 y.o. 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  Patient reports that he got in for a therapeutic paracentesis was apparently normal for him and he was called by his physician and told to come into the ER for bacteria in the fluid.  It looks like the peritoneal fluid grew gram-negative rods.  Patient reports he had otherwise been in his normal state of health.  He denies any fevers.  Admits to mild fatigue.  At this time he feels like his abdomen is less bloated and he has no dyspnea.  He reports that before the paracentesis his abdomen was quite distended with fluid, and he found it more difficult to breathe because of that.  Patient reports that he still does make urine, and has an indwelling Foley.  Patient reports that sometimes the bag is full after 24 hours, other times its not.  He really has not noticed a pattern with that.  Patient reports no associated abdominal pain.  No fevers.  Onset of his fluid buildup was over a couple of weeks, gradual, constant.  Nothing that he notes makes the fluid retention worse.  He reports that dialysis always tells him to restrict his fluids and go on a low-sodium diet.  Patient is frustrated with this because then they tell him his sodium is low.  The only thing that makes the fluid retention better is the therapeutic taps.  Patient has no other complaints at this time.   Assessment & Plan:   Active Problems:   SBP (spontaneous bacterial peritonitis) ([place])   Mixed hyperlipidemia   Type 2 diabetes mellitus (HCC)   Hypoalbuminemia   Alcoholic cirrhosis of  liver (HCC)   Gastroesophageal reflux disease   Hyponatremia  SBP - plan to treat with Zosyn IV x5 days and then resume prophylactic levofloxacin three times weekly with HD - plan to DC home after completing 5 days of IV antibiotics, possibly 11/15.    Hypokalemia - repleted  ESRD on HD - he remains on MWF HD schedule  Alcoholic cirrhosis - continue lactulose, rifaximin - continue aldactone  Essential hypertension  - resume home diltiazem  Type 2 DM with hypoglycemia - pt reports that he rarely uses insulin at home - DC basal insulin - monitor CBG - added very sensitive SSI coverage (given ESRD)  GERD - stable on home oral protonix  Hyperlipidemia - continue home pravastatin    DVT prophylaxis: SCDs Code Status: FULL  Family Communication: plan of care discussed with patient at bedside  Disposition: [facility] with [facility]  Status is: Inpatient  Remains inpatient appropriate because: IV antibiotics for 5 days per GI service    Consultants:  GI   Procedures:    Antimicrobials:  Zosyn 11/11>>   Subjective: Pt without complaints. No abdominal pain, no fever or chills.  He is due for dialysis today.    Objective: Vitals:   04/14/21 0940 04/14/21 1000 04/14/21 1030 04/14/21 1100  BP: 140/90 (!) 155/70 (!) 149/74 126/65  Pulse: 78 77 80 76  Resp: 20 20 18 20   Temp:      TempSrc:      SpO2:  Weight:      Height:        Intake/Output Summary (Last 24 hours) at 04/14/2021 1123 Last data filed at 04/14/2021 0555 Gross per 24 hour  Intake 51.24 ml  Output 200 ml  Net -148.76 ml   Filed Weights   04/10/21 2345 04/11/21 1425 04/14/21 0930  Weight: 91.4 kg 91.7 kg 93 kg    Examination:  General exam: pt appears chronically ill.  Appears calm and comfortable  Respiratory system: Clear to auscultation. Respiratory effort normal. Cardiovascular system: normal S1 & S2 heard. No JVD, murmurs, rubs, gallops or clicks. No pedal edema. Gastrointestinal system:  Abdomen is mildly distended, soft and nontender. No organomegaly or masses felt. Normal bowel sounds heard. Central nervous system: Alert and oriented. No focal neurological deficits. Extremities: Symmetric 5 x 5 power. Skin: No rashes, lesions or ulcers Psychiatry: Judgement and insight appear normal. Mood & affect appropriate.   Data Reviewed: I have personally reviewed following labs and imaging studies  CBC: Recent Labs  Lab 04/08/21 1220 04/10/21 1838 04/11/21 0521 04/14/21 0446  WBC 3.7* 3.1* 3.2* 4.1  NEUTROABS 3.1 2.2 2.3  --   HGB 9.0* 8.8* 8.8* 8.9*  HCT 28.7* 28.0* 28.4* 27.8*  MCV 105.9* 105.3* 106.8* 104.5*  PLT 82* 95* 89* 73*    Basic Metabolic Panel: Recent Labs  Lab 04/10/21 1838 04/11/21 0521 04/13/21 0351 04/14/21 0446  NA 129* 132* 128* 125*  K 3.3* 3.3* 4.5 4.1  CL 91* 95* 91* 88*  CO2 27 27 27 26   GLUCOSE 216* 84 236* 204*  BUN 30* 32* 27* 33*  CREATININE 2.89* 3.15* 3.35* 3.88*  CALCIUM 8.4* 8.4* 8.5* 8.7*  MG  --  1.6*  --   --   PHOS  --  4.4 4.7* 5.1*    GFR: Estimated Creatinine Clearance: 18.2 mL/min (A) (by C-G formula based on SCr of 3.88 mg/dL (H)).  Liver Function Tests: Recent Labs  Lab 04/10/21 1838 04/11/21 0521 04/13/21 0351 04/14/21 0446 04/14/21 1023  AST 20 15  --   --  36  ALT 16 14  --   --  26  ALKPHOS 227* 162*  --   --  191*  BILITOT 0.7 1.0  --   --  1.0  PROT 7.0 6.0*  --   --  6.8  ALBUMIN 2.9* 2.6* 3.1* 3.7 3.4*    CBG: Recent Labs  Lab 04/11/21 0729 04/11/21 0751 04/11/21 0816 04/11/21 0944 04/11/21 1110  GLUCAP 52* 54* 63* 140* 152*    Recent Results (from the past 240 hour(s))  Culture, body fluid w Gram Stain-bottle     Status: None (Preliminary result)   Collection Time: 04/10/21  9:30 AM   Specimen: Peritoneal Washings  Result Value Ref Range Status   Specimen Description PERITONEAL  Final   Special Requests   Final    BOTTLES DRAWN AEROBIC AND ANAEROBIC Blood Culture results may not  be optimal due to an excessive volume of blood received in culture bottles   Culture   Final    NO GROWTH 4 DAYS Performed at [facility], [address] [place], [place] [address]    Report Status PENDING  Incomplete  Gram stain     Status: None   Collection Time: 04/10/21  9:30 AM   Specimen: Peritoneal Washings  Result Value Ref Range Status   Specimen Description PERITONEAL  Final   Special Requests NONE  Final   Gram Stain   Final  GRAM POSITIVE RODS IN CLUSTERS CYTOSPIN SMEAR Gram Stain Report Called to,Read Back By and Verified WithClayborne Dana RN 1300 04/10/21 K FORSYTH Performed at [facility], [address], [place], [place] [address]    Report Status 04/10/2021 FINAL  Final  Blood culture (routine x 2)     Status: None (Preliminary result)   Collection Time: 04/10/21  6:25 PM   Specimen: Right Antecubital; Blood  Result Value Ref Range Status   Specimen Description RIGHT ANTECUBITAL  Final   Special Requests   Final    Blood Culture adequate volume BOTTLES DRAWN AEROBIC AND ANAEROBIC   Culture   Final    NO GROWTH 4 DAYS Performed at [facility], [address], [place], [place] [address]    Report Status PENDING  Incomplete  Blood culture (routine x 2)     Status: None (Preliminary result)   Collection Time: 04/10/21  6:38 PM   Specimen: BLOOD RIGHT ARM  Result Value Ref Range Status   Specimen Description BLOOD RIGHT ARM  Final   Special Requests   Final    Blood Culture adequate volume BOTTLES DRAWN AEROBIC AND ANAEROBIC   Culture   Final    NO GROWTH 4 DAYS Performed at [facility], [address], [place], [place] [address]    Report Status PENDING  Incomplete  Resp Panel by RT-PCR (Flu A&B, Covid) Nasopharyngeal Swab     Status: None   Collection Time: 04/10/21  9:20 PM   Specimen: Nasopharyngeal Swab; Nasopharyngeal(NP) swabs in vial transport medium  Result Value Ref Range Status   SARS Coronavirus 2 by RT PCR NEGATIVE NEGATIVE Final    Comment:  (NOTE) SARS-CoV-2 target nucleic acids are NOT DETECTED.  The SARS-CoV-2 RNA is generally detectable in upper respiratory specimens during the acute phase of infection. The lowest concentration of SARS-CoV-2 viral copies this assay can detect is 138 copies/mL. A negative result does not preclude SARS-Cov-2 infection and should not be used as the sole basis for treatment or other patient management decisions. A negative result may occur with  improper specimen collection/handling, submission of specimen other than nasopharyngeal swab, presence of viral mutation(s) within the areas targeted by this assay, and inadequate number of viral copies(<138 copies/mL). A negative result must be combined with clinical observations, patient history, and epidemiological information. The expected result is Negative.  Fact Sheet for Patients:  EntrepreneurPulse.com.au  Fact Sheet for Healthcare Providers:  IncredibleEmployment.be  This test is no t yet approved or cleared by the [place] FDA and  has been authorized for detection and/or diagnosis of SARS-CoV-2 by FDA under an Emergency Use Authorization (EUA). This EUA will remain  in effect (meaning this test can be used) for the duration of the COVID-19 declaration under Section 564(b)(1) of the Act, 21 U.S.C.section 360bbb-3(b)(1), unless the authorization is terminated  or revoked sooner.       Influenza A by PCR NEGATIVE NEGATIVE Final   Influenza B by PCR NEGATIVE NEGATIVE Final    Comment: (NOTE) The Xpert Xpress SARS-CoV-2/FLU/RSV plus assay is intended as an aid in the diagnosis of influenza from Nasopharyngeal swab specimens and should not be used as a sole basis for treatment. Nasal washings and aspirates are unacceptable for Xpert Xpress SARS-CoV-2/FLU/RSV testing.  Fact Sheet for Patients: EntrepreneurPulse.com.au  Fact Sheet for Healthcare  Providers: IncredibleEmployment.be  This test is not yet approved or cleared by the [place] FDA and has been authorized for detection and/or diagnosis of SARS-CoV-2 by  FDA under an Emergency Use Authorization (EUA). This EUA will remain in effect (meaning this test can be used) for the duration of the COVID-19 declaration under Section 564(b)(1) of the Act, 21 U.S.C. section 360bbb-3(b)(1), unless the authorization is terminated or revoked.  Performed at [facility], [address], [place], [place] [address]      Radiology Studies: No results found.  Scheduled Meds:  Chlorhexidine Gluconate Cloth  6 each Topical Q0600   Chlorhexidine Gluconate Cloth  6 each Topical Q0600   darbepoetin (ARANESP) injection - DIALYSIS  150 mcg Intravenous Q Fri-HD   diltiazem  120 mg Oral Daily   gabapentin  100 mg Oral Q supper   hydrOXYzine  25 mg Oral TID   insulin aspart  0-6 Units Subcutaneous TID WC   lactulose  30 g Oral BID   metoprolol succinate  25 mg Oral QHS   metoprolol succinate  25 mg Oral Once per day on Sun Tue Thu Sat   mirtazapine  30 mg Oral Q supper   nystatin   Topical TID   pantoprazole  80 mg Oral Daily   pravastatin  20 mg Oral q AM   rifaximin  550 mg Oral BID   spironolactone  50 mg Oral q AM   venlafaxine  75 mg Oral TID WC   Continuous Infusions:  sodium chloride     sodium chloride     piperacillin-tazobactam Stopped (04/14/21 0200)    LOS: 4 days   Time spent: 30 mins   Deniya Craigo Wynetta Emery, MD How to contact the [facility] Attending or Consulting provider [place] or covering provider during after hours [place], for this patient?  Check the care team in [facility] and look for a) attending/consulting [facility] provider listed and b) the [facility] team listed Log into [email or web address] and use [place]'s universal password to access. If you do not have the password, please contact the hospital operator. Locate the [facility] provider you are looking for under [facility] and page to a number that you can be directly reached. If you still have difficulty reaching the provider, please page the [facility] (Director on Call) for the Hospitalists listed on amion for assistance.  04/14/2021, 11:23 AM

## 2021-04-14 NOTE — Progress Notes (Signed)
Patients CBG was 253. CBG results did not cross over into epic.

## 2021-04-14 NOTE — Progress Notes (Signed)
    Subjective: Denies abdominal pain, N/V. Sitting up on side of bed today. Feels abdomen is less distended and does not feel he needs a para. No overt Gi bleeding. No mental status changes or confusion.   Objective: Vital signs in last 24 hours: Temp:  [97.9 F (36.6 C)-98.1 F (36.7 C)] 98.1 F (36.7 C) (11/13 2055) Pulse Rate:  [73] 73 (11/13 2055) Resp:  [19] 19 (11/13 2055) BP: (138-144)/(59-64) 141/64 (11/13 2055) SpO2:  [100 %] 100 % (11/13 2055) Last BM Date: 04/13/21 General:   Alert and oriented, pleasant, sitting on side of bed Head:  Normocephalic and atraumatic. Abdomen:  sitting on side of bed. Upper abdomen firm, lower abdomen soft. Difficult to complete full exam as sitting on side of bed getting ready to eat Neurologic:  Alert and  oriented x4. Negative asterixis  Intake/Output from previous day: 11/13 0701 - 11/14 0700 In: 51.2 [IV Piggyback:51.2] Out: 200 [Urine:200] Intake/Output this shift: No intake/output data recorded.  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  Possible SBP: query possible contaminant on gram stain, culture negative, but with complicated history we are treating as if SBP. Complete course of IV antibiotics, then recommend resuming Levaquin 500 mg three times weekly after dialysis. As he has remained stable, holding off on repeat para unless needed for therapeutic purposes.   Decompensated ETOH cirrhosis: without mental status changes or confusion. No asterixis on exam today. Continue with lactulose and Xifaxan. Overdue for EGD, but this can be done as outpatient.    Plan: Complete 5 day course of IV antibiotics, then recommend Levaquin 500 mg three times weekly after dialysis.  Check HFP and INR today to calculate MELD Dialysis today Outpatient EGD for variceal surveillance Outpatient visit 12/13 as already planned GI to sign off  Annitta Needs, PhD, ANP-BC [facility]    LOS: 4 days    04/14/2021, 8:11 AM

## 2021-04-14 NOTE — Procedures (Signed)
Patient was seen on dialysis and the procedure was supervised.  BFR 400  Via AVF BP is  155/70.   Patient appears to be tolerating treatment well  Louis Meckel 04/14/2021

## 2021-04-15 ENCOUNTER — Ambulatory Visit (HOSPITAL_COMMUNITY): Payer: Medicare Other | Admitting: Physician Assistant

## 2021-04-15 DIAGNOSIS — K219 Gastro-esophageal reflux disease without esophagitis: Secondary | ICD-10-CM | POA: Diagnosis not present

## 2021-04-15 DIAGNOSIS — K7031 Alcoholic cirrhosis of liver with ascites: Secondary | ICD-10-CM | POA: Diagnosis not present

## 2021-04-15 DIAGNOSIS — E8809 Other disorders of plasma-protein metabolism, not elsewhere classified: Secondary | ICD-10-CM | POA: Diagnosis not present

## 2021-04-15 DIAGNOSIS — K652 Spontaneous bacterial peritonitis: Secondary | ICD-10-CM | POA: Diagnosis not present

## 2021-04-15 LAB — CULTURE, BODY FLUID W GRAM STAIN -BOTTLE: Culture: NO GROWTH

## 2021-04-15 LAB — GLUCOSE, CAPILLARY
Glucose-Capillary: 225 mg/dL — ABNORMAL HIGH (ref 70–99)
Glucose-Capillary: 299 mg/dL — ABNORMAL HIGH (ref 70–99)
Glucose-Capillary: 224 mg/dL — ABNORMAL HIGH (ref 70–99)
Glucose-Capillary: 227 mg/dL — ABNORMAL HIGH (ref 70–99)
Glucose-Capillary: 313 mg/dL — ABNORMAL HIGH (ref 70–99)
Glucose-Capillary: 317 mg/dL — ABNORMAL HIGH (ref 70–99)

## 2021-04-15 LAB — CULTURE, BLOOD (ROUTINE X 2)
Culture: NO GROWTH
Culture: NO GROWTH
Special Requests: ADEQUATE
Special Requests: ADEQUATE

## 2021-04-15 MED ORDER — HYDROCORTISONE (PERIANAL) 2.5 % EX CREA: 1.0000 "application " | TOPICAL_CREAM | Freq: Two times a day (BID) | CUTANEOUS | Status: DC | PRN

## 2021-04-15 MED ORDER — METOPROLOL SUCCINATE ER 25 MG PO TB24
25.0000 mg | ORAL_TABLET | ORAL | Status: DC
Start: 2021-04-15 — End: 2022-02-19

## 2021-04-15 NOTE — Progress Notes (Signed)
Blood glucose 224 mg/dl

## 2021-04-15 NOTE — Discharge Instructions (Signed)

## 2021-04-15 NOTE — Progress Notes (Signed)
Nsg Discharge Note  Admit Date:  04/10/2021 Discharge date: 04/15/2021   Jeffrey [name] to be D/C'd Home per MD order.  AVS completed.  Copy for chart, and copy for patient signed, and dated. Patient/caregiver able to verbalize understanding.  Discharge Medication: Allergies as of 04/15/2021       Reactions   Tape Itching, Rash, Other (See Comments)   Tears skin        Medication List     STOP taking these medications    insulin lispro 100 UNIT/ML KwikPen Commonly known as: HUMALOG   nystatin powder Commonly known as: MYCOSTATIN/NYSTOP   Tresiba FlexTouch 200 UNIT/ML FlexTouch Pen Generic drug: insulin degludec       TAKE these medications    acetaminophen 650 MG CR tablet Commonly known as: TYLENOL Take 650 mg by mouth every 8 (eight) hours as needed for pain.   Alcohol Prep 70 % Pads Apply 1 each topically 3 (three) times daily.   Alka-Seltzer Heartburn + Gas 750-80 MG Chew Generic drug: Calcium Carbonate-Simethicone Chew 1-2 tablets by mouth 2 (two) times daily as needed (acid reflux).   ALPRAZolam 0.5 MG tablet Commonly known as: XANAX Take 0.5 mg by mouth See admin instructions. Take one tablet (0.5 mg) by mouth on Monday, Wednesday, Friday before dialysis, may also take one tablet (0.5 mg) daily as needed for anxiety.   calcium carbonate 500 MG chewable tablet Commonly known as: TUMS - dosed in mg elemental calcium Chew 1 tablet by mouth 3 (three) times daily with meals.   diltiazem 120 MG 24 hr capsule Commonly known as: TIAZAC Take 120 mg by mouth daily.   furosemide 40 MG tablet Commonly known as: LASIX Take 80 mg by mouth daily.   gabapentin 100 MG capsule Commonly known as: NEURONTIN TAKE 1 CAPSULE BY MOUTH AT BEDTIME What changed: when to take this   GlucaGen HypoKit 1 MG Solr injection Generic drug: glucagon Inject 1 mg into the muscle once as needed for low blood sugar.   hydrocortisone 2.5 % rectal cream Commonly known as:  Anusol-HC Place 1 application rectally 2 (two) times daily as needed for hemorrhoids or anal itching.   hydrOXYzine 25 MG tablet Commonly known as: ATARAX/VISTARIL Take 25 mg by mouth 3 (three) times daily. For itching   iron polysaccharides 150 MG capsule Commonly known as: NIFEREX Take 150 mg by mouth daily with supper.   lactulose 10 GM/15ML solution Commonly known as: Constulose TAKE 45 MLS EVERY MORNING AND TAKE 45 MLS EVERY EVENING What changed:  how much to take how to take this when to take this additional instructions   levofloxacin 500 MG tablet Commonly known as: LEVAQUIN TAKE 1 TABLET BY MOUTH THREE TIMES EVERY WEEK AFTER DIALYSIS   metoprolol succinate 25 MG 24 hr tablet Commonly known as: TOPROL-XL Take 1 tablet (25 mg total) by mouth See admin instructions. Take one tablet (25 mg) by mouth at bedtime every night and take one tablet (25 mg) in morning on  Sun, Tues, Thurs and Sat (non-dialysis days)   midodrine 10 MG tablet Commonly known as: PROAMATINE Take 1 tablet (10 mg total) by mouth 2 (two) times daily with a meal.   milk thistle 175 MG tablet Take 175 mg by mouth 3 (three) times daily.   mirtazapine 30 MG tablet Commonly known as: REMERON Take 1 tablet (30 mg total) by mouth at bedtime. What changed: when to take this   multivitamin with minerals Tabs tablet Take 1 tablet  by mouth in the morning.   omega-3 acid ethyl esters 1 g capsule Commonly known as: LOVAZA Take 2 g by mouth 2 (two) times daily.   ondansetron 4 MG tablet Commonly known as: ZOFRAN Take 4 mg by mouth every 8 (eight) hours as needed for nausea or vomiting.   oxyCODONE-acetaminophen 5-325 MG tablet Commonly known as: PERCOCET/ROXICET Take 1 tablet by mouth every 4 (four) hours as needed for moderate pain.   pantoprazole 40 MG tablet Commonly known as: PROTONIX Take 2 tablets (80 mg total) by mouth daily.   polyethylene glycol-electrolytes 420 g solution Commonly known  as: TriLyte Take 4,000 mLs by mouth as directed.   pravastatin 20 MG tablet Commonly known as: PRAVACHOL Take 20 mg by mouth in the morning.   PROBIOTIC DAILY PO Take 1 tablet by mouth in the morning.   spironolactone 50 MG tablet Commonly known as: ALDACTONE Take 50 mg by mouth in the morning.   thiamine 100 MG tablet Commonly known as: Vitamin B-1 Take 100 mg by mouth daily.   tiZANidine 2 MG tablet Commonly known as: ZANAFLEX Take 2 mg by mouth 2 (two) times daily as needed for muscle spasms.   TRI-VIT/FLUORIDE/IRON PO Take 1 tablet by mouth daily.   triamcinolone cream 0.1 % Commonly known as: KENALOG Apply 1 application topically 2 (two) times daily as needed (dry skin Itching).   venlafaxine 75 MG tablet Commonly known as: EFFEXOR Take 75 mg by mouth 3 (three) times daily with meals.   vitamin C 1000 MG tablet Take 1,000 mg by mouth in the morning.   Xifaxan 550 MG Tabs tablet Generic drug: rifaximin TAKE 1 TABLET BY MOUTH TWICE DAILY        Discharge Assessment: Vitals:   04/15/21 0304 04/15/21 0820  BP: (!) 155/75 (!) 146/69  Pulse: 93 77  Resp: 19   Temp: 98.1 F (36.7 C)   SpO2: 100%    Skin clean, dry and intact without evidence of skin break down, no evidence of skin tears noted. IV catheter discontinued intact. Site without signs and symptoms of complications - no redness or edema noted at insertion site, patient denies c/o pain - only slight tenderness at site.  Dressing with slight pressure applied.  D/c Instructions-Education: Discharge instructions given to patient/family with verbalized understanding. D/c education completed with patient/family including follow up instructions, medication list, d/c activities limitations if indicated, with other d/c instructions as indicated by MD - patient able to verbalize understanding, all questions fully answered. Patient instructed to return to ED, call 911, or call MD for any changes in condition.   Patient escorted via [place], and D/C home via private auto. Clovis Fredrickson, LPN 13/24/4010 2:72 PM

## 2021-04-15 NOTE — Progress Notes (Signed)
CBG- 227 did not cross over

## 2021-04-15 NOTE — Progress Notes (Signed)
Subjective:  s/p hd yest-  removed 3000 without incident-  no new complaints Objective Vital signs in last 24 hours: Vitals:   04/14/21 1305 04/14/21 1358 04/14/21 2052 04/15/21 0304  BP: (!) 150/72 (!) 155/64 (!) 164/68 (!) 155/75  Pulse: (!) 18 82 90 93  Resp: 18 19 20 19   Temp: 98.4 F (36.9 C)  98.2 F (36.8 C) 98.1 F (36.7 C)  TempSrc: Oral     SpO2: 100% 100% 100% 100%  Weight: 90 kg     Height:       Weight change:   Intake/Output Summary (Last 24 hours) at 04/15/2021 0744 Last data filed at 04/15/2021 0600 Gross per 24 hour  Intake 1959.38 ml  Output 3100 ml  Net -1140.62 ml   -outpatient orders: [facility], MWF, 4 hrs, Revaclear 300, 2K, 2.5 Cal, 350/500, EDW 93 kg, no heparin, AVF on left Epogen 14,000 units q. treatment, Venofer 50 mg q. Mondays   Assessment/ Plan: Pt is a 75 y.o. yo male with ESRD and Cirrhosis who was admitted on 04/10/2021 with ?possible SBP detected after a LVP   Assessment/Plan: 1. ?SBP-  thinking possible contaminant but GI wanting to treat with iv zosyn for 5 days-  would be done today-  then continue levaquin 2. ESRD - normally MWF via AVF- HD 11/14 and next would be tomorrow -  hopefully can be at his OP unit  3. Anemia-  on Darbe 150 q week-  given on Friday-  had a recent GIB as well - hgb stable close to 9 4. Secondary hyperparathyroidism-  is not on any bone meds as OP it seems-  except for maybe tums-  calc/phos OK here and no recent PTH 5. HTN/volume-  on diltiazem, toprol and aldactone-  but BP seems high enough to achieve UF with HD -  sodium low due to cirrhosis and volume overload-  I reiterated fluid restriction.  Got under EDW yesterday-  would continue to push as OP  6. Dispo-   hopeful for discharge today so can go to OP HD on Wednesday   Lila Lufkin A Darron Stuck    Labs: Basic Metabolic Panel: Recent Labs  Lab 04/11/21 0521 04/13/21 0351 04/14/21 0446  NA 132* 128* 125*  K 3.3* 4.5 4.1  CL 95* 91* 88*  CO2 27 27 26    GLUCOSE 84 236* 204*  BUN 32* 27* 33*  CREATININE 3.15* 3.35* 3.88*  CALCIUM 8.4* 8.5* 8.7*  PHOS 4.4 4.7* 5.1*   Liver Function Tests: Recent Labs  Lab 04/10/21 1838 04/11/21 0521 04/13/21 0351 04/14/21 0446 04/14/21 1023  AST 20 15  --   --  36  ALT 16 14  --   --  26  ALKPHOS 227* 162*  --   --  191*  BILITOT 0.7 1.0  --   --  1.0  PROT 7.0 6.0*  --   --  6.8  ALBUMIN 2.9* 2.6* 3.1* 3.7 3.4*   No results for input(s): LIPASE, AMYLASE in the last 168 hours. No results for input(s): AMMONIA in the last 168 hours. CBC: Recent Labs  Lab 04/08/21 1220 04/10/21 1838 04/11/21 0521 04/14/21 0446  WBC 3.7* 3.1* 3.2* 4.1  NEUTROABS 3.1 2.2 2.3  --   HGB 9.0* 8.8* 8.8* 8.9*  HCT 28.7* 28.0* 28.4* 27.8*  MCV 105.9* 105.3* 106.8* 104.5*  PLT 82* 95* 89* 73*   Cardiac Enzymes: No results for input(s): CKTOTAL, CKMB, CKMBINDEX, TROPONINI in the last 168 hours. CBG:  Recent Labs  Lab 04/13/21 2129 04/14/21 0422 04/14/21 0752 04/14/21 0834 04/14/21 1217  GLUCAP 210* 200* 235* 253* 72    Iron Studies: No results for input(s): IRON, TIBC, TRANSFERRIN, FERRITIN in the last 72 hours. Studies/Results: No results found. Medications: Infusions:  sodium chloride     sodium chloride     piperacillin-tazobactam Stopped (04/15/21 0145)    Scheduled Medications:  Chlorhexidine Gluconate Cloth  6 each Topical Q0600   darbepoetin (ARANESP) injection - DIALYSIS  150 mcg Intravenous Q Fri-HD   diltiazem  120 mg Oral Daily   gabapentin  100 mg Oral Q supper   hydrOXYzine  25 mg Oral TID   insulin aspart  0-6 Units Subcutaneous TID WC   lactulose  30 g Oral BID   metoprolol succinate  25 mg Oral QHS   metoprolol succinate  25 mg Oral Once per day on Sun Tue Thu Sat   mirtazapine  30 mg Oral Q supper   nystatin   Topical TID   pantoprazole  80 mg Oral Daily   pravastatin  20 mg Oral q AM   rifaximin  550 mg Oral BID   spironolactone  50 mg Oral q AM   venlafaxine  75 mg  Oral TID WC    have reviewed scheduled and prn medications.  Physical Exam: General: NAD Heart: RRR Lungs: mostly clear Abdomen: obese, distended Extremities: mild pitting dep edema Dialysis Access: left AVF     04/15/2021,7:44 AM  LOS: 5 days

## 2021-04-15 NOTE — Discharge Summary (Signed)
Physician Discharge Summary  HYDER DEMAN YYT:[id number] DOB: 1946-04-16 DOA: 04/10/2021  PCP: Monico Blitz, MD GI: [facility]   Admit date: 04/10/2021 Discharge date: 04/15/2021  Admitted From:  HOME  Disposition: HOME   Recommendations for Outpatient Follow-up:  Follow up with PCP in 1 weeks Follow up with GI next month as scheduled Please obtain BMP/CBC in 1-2 weeks  Discharge Condition: STABLE   CODE STATUS: FULL   DIET: resume prior home diet Brief Hospitalization Summary: Please see all hospital notes, images, labs for full details of the hospitalization. Brief Admission History:  75 y.o. 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  Patient reports that he got in for a therapeutic paracentesis was apparently normal for him and he was called by his physician and told to come into the ER for bacteria in the fluid.  It looks like the peritoneal fluid grew gram-negative rods.  Patient reports he had otherwise been in his normal state of health.  He denies any fevers.  Admits to mild fatigue.  At this time he feels like his abdomen is less bloated and he has no dyspnea.  He reports that before the paracentesis his abdomen was quite distended with fluid, and he found it more difficult to breathe because of that.  Patient reports that he still does make urine, and has an indwelling Foley.  Patient reports that sometimes the bag is full after 24 hours, other times its not.  He really has not noticed a pattern with that.  Patient reports no associated abdominal pain.  No fevers.  Onset of his fluid buildup was over a couple of weeks, gradual, constant.  Nothing that he notes makes the fluid retention worse.  He reports that dialysis always tells him to restrict his fluids and go  on a low-sodium diet.  Patient is frustrated with this because then they tell him his sodium is low.  The only thing that makes the fluid retention better is the therapeutic taps.  Patient has no other complaints at this time.   Assessment & Plan:   Active Problems:   SBP (spontaneous bacterial peritonitis) ([place])   Mixed hyperlipidemia   Type 2 diabetes mellitus (HCC)   Hypoalbuminemia   Alcoholic cirrhosis of liver (HCC)   Gastroesophageal reflux disease   Hyponatremia   SBP - treated with Zosyn IV x5 days and then resume prophylactic levofloxacin three times weekly with HD - plan to DC home after completing 5 days of IV antibiotics 11/15.   - outpatient follow up with GI arranged for dec 2022   Hypokalemia - repleted   ESRD on HD - he remains on MWF HD schedule   Alcoholic cirrhosis - continue lactulose, rifaximin - continue home diuretics    Essential hypertension  - resume home diltiazem   Type 2 DM with hypoglycemia - pt reports that he rarely uses insulin at home - DC basal insulin - monitor CBG  - outpatient follow up    GERD - stable on home oral protonix   Hyperlipidemia - continue home pravastatin      DVT prophylaxis: SCDs Code Status: FULL  Family Communication: plan of care discussed with patient at bedside  Disposition: home Discharge Diagnoses:  Active Problems:   SBP (spontaneous bacterial peritonitis) ([place])   Mixed hyperlipidemia   Type 2 diabetes  mellitus ([place])   Hypoalbuminemia   Alcoholic cirrhosis of liver (HCC)   Gastroesophageal reflux disease   Hyponatremia   Discharge Instructions:  Allergies as of 04/15/2021       Reactions   Tape Itching, Rash, Other (See Comments)   Tears skin        Medication List     STOP taking these medications    insulin lispro 100 UNIT/ML KwikPen Commonly known as: HUMALOG   nystatin powder Commonly known as: MYCOSTATIN/NYSTOP   Tresiba FlexTouch 200 UNIT/ML FlexTouch Pen Generic  drug: insulin degludec       TAKE these medications    acetaminophen 650 MG CR tablet Commonly known as: TYLENOL Take 650 mg by mouth every 8 (eight) hours as needed for pain.   Alcohol Prep 70 % Pads Apply 1 each topically 3 (three) times daily.   Alka-Seltzer Heartburn + Gas 750-80 MG Chew Generic drug: Calcium Carbonate-Simethicone Chew 1-2 tablets by mouth 2 (two) times daily as needed (acid reflux).   ALPRAZolam 0.5 MG tablet Commonly known as: XANAX Take 0.5 mg by mouth See admin instructions. Take one tablet (0.5 mg) by mouth on Monday, Wednesday, Friday before dialysis, may also take one tablet (0.5 mg) daily as needed for anxiety.   calcium carbonate 500 MG chewable tablet Commonly known as: TUMS - dosed in mg elemental calcium Chew 1 tablet by mouth 3 (three) times daily with meals.   diltiazem 120 MG 24 hr capsule Commonly known as: TIAZAC Take 120 mg by mouth daily.   furosemide 40 MG tablet Commonly known as: LASIX Take 80 mg by mouth daily.   gabapentin 100 MG capsule Commonly known as: NEURONTIN TAKE 1 CAPSULE BY MOUTH AT BEDTIME What changed: when to take this   GlucaGen HypoKit 1 MG Solr injection Generic drug: glucagon Inject 1 mg into the muscle once as needed for low blood sugar.   hydrocortisone 2.5 % rectal cream Commonly known as: Anusol-HC Place 1 application rectally 2 (two) times daily as needed for hemorrhoids or anal itching.   hydrOXYzine 25 MG tablet Commonly known as: ATARAX/VISTARIL Take 25 mg by mouth 3 (three) times daily. For itching   iron polysaccharides 150 MG capsule Commonly known as: NIFEREX Take 150 mg by mouth daily with supper.   lactulose 10 GM/15ML solution Commonly known as: Constulose TAKE 45 MLS EVERY MORNING AND TAKE 45 MLS EVERY EVENING What changed:  how much to take how to take this when to take this additional instructions   levofloxacin 500 MG tablet Commonly known as: LEVAQUIN TAKE 1 TABLET BY  MOUTH THREE TIMES EVERY WEEK AFTER DIALYSIS   metoprolol succinate 25 MG 24 hr tablet Commonly known as: TOPROL-XL Take 1 tablet (25 mg total) by mouth See admin instructions. Take one tablet (25 mg) by mouth at bedtime every night and take one tablet (25 mg) in morning on  Sun, Tues, Thurs and Sat (non-dialysis days)   midodrine 10 MG tablet Commonly known as: PROAMATINE Take 1 tablet (10 mg total) by mouth 2 (two) times daily with a meal.   milk thistle 175 MG tablet Take 175 mg by mouth 3 (three) times daily.   mirtazapine 30 MG tablet Commonly known as: REMERON Take 1 tablet (30 mg total) by mouth at bedtime. What changed: when to take this   multivitamin with minerals Tabs tablet Take 1 tablet by mouth in the morning.   omega-3 acid ethyl esters 1 g capsule Commonly known as: LOVAZA Take  2 g by mouth 2 (two) times daily.   ondansetron 4 MG tablet Commonly known as: ZOFRAN Take 4 mg by mouth every 8 (eight) hours as needed for nausea or vomiting.   oxyCODONE-acetaminophen 5-325 MG tablet Commonly known as: PERCOCET/ROXICET Take 1 tablet by mouth every 4 (four) hours as needed for moderate pain.   pantoprazole 40 MG tablet Commonly known as: PROTONIX Take 2 tablets (80 mg total) by mouth daily.   polyethylene glycol-electrolytes 420 g solution Commonly known as: TriLyte Take 4,000 mLs by mouth as directed.   pravastatin 20 MG tablet Commonly known as: PRAVACHOL Take 20 mg by mouth in the morning.   PROBIOTIC DAILY PO Take 1 tablet by mouth in the morning.   spironolactone 50 MG tablet Commonly known as: ALDACTONE Take 50 mg by mouth in the morning.   thiamine 100 MG tablet Commonly known as: Vitamin B-1 Take 100 mg by mouth daily.   tiZANidine 2 MG tablet Commonly known as: ZANAFLEX Take 2 mg by mouth 2 (two) times daily as needed for muscle spasms.   TRI-VIT/FLUORIDE/IRON PO Take 1 tablet by mouth daily.   triamcinolone cream 0.1 % Commonly known  as: KENALOG Apply 1 application topically 2 (two) times daily as needed (dry skin Itching).   venlafaxine 75 MG tablet Commonly known as: EFFEXOR Take 75 mg by mouth 3 (three) times daily with meals.   vitamin C 1000 MG tablet Take 1,000 mg by mouth in the morning.   Xifaxan 550 MG Tabs tablet Generic drug: rifaximin TAKE 1 TABLET BY MOUTH TWICE DAILY        Follow-up Information     Monico Blitz, MD. Schedule an appointment as soon as possible for a visit in 2 week(s).   Specialty: Internal Medicine Why: Hospital Follow Up Contact information: [place] [address] [phone]         [facility]. Schedule an appointment as soon as possible for a visit in 1 month(s).   Why: Hospital Follow Up Contact information: [place] [id number] [phone]               Allergies  Allergen Reactions   Tape Itching, Rash and Other (See Comments)    Tears skin   Allergies as of 04/15/2021       Reactions   Tape Itching, Rash, Other (See Comments)   Tears skin        Medication List     STOP taking these medications    insulin lispro 100 UNIT/ML KwikPen Commonly known as: HUMALOG   nystatin powder Commonly known as: MYCOSTATIN/NYSTOP   Tresiba FlexTouch 200 UNIT/ML FlexTouch Pen Generic drug: insulin degludec       TAKE these medications    acetaminophen 650 MG CR tablet Commonly known as: TYLENOL Take 650 mg by mouth every 8 (eight) hours as needed for pain.   Alcohol Prep 70 % Pads Apply 1 each topically 3 (three) times daily.   Alka-Seltzer Heartburn + Gas 750-80 MG Chew Generic drug: Calcium Carbonate-Simethicone Chew 1-2 tablets by mouth 2 (two) times daily as needed (acid reflux).   ALPRAZolam 0.5 MG tablet Commonly known as: XANAX Take 0.5 mg by mouth See admin instructions. Take one tablet (0.5 mg) by mouth on Monday, Wednesday, Friday before dialysis, may also take  one tablet (0.5 mg) daily as needed for anxiety.   calcium carbonate 500 MG chewable tablet Commonly known as: TUMS - dosed in mg  elemental calcium Chew 1 tablet by mouth 3 (three) times daily with meals.   diltiazem 120 MG 24 hr capsule Commonly known as: TIAZAC Take 120 mg by mouth daily.   furosemide 40 MG tablet Commonly known as: LASIX Take 80 mg by mouth daily.   gabapentin 100 MG capsule Commonly known as: NEURONTIN TAKE 1 CAPSULE BY MOUTH AT BEDTIME What changed: when to take this   GlucaGen HypoKit 1 MG Solr injection Generic drug: glucagon Inject 1 mg into the muscle once as needed for low blood sugar.   hydrocortisone 2.5 % rectal cream Commonly known as: Anusol-HC Place 1 application rectally 2 (two) times daily as needed for hemorrhoids or anal itching.   hydrOXYzine 25 MG tablet Commonly known as: ATARAX/VISTARIL Take 25 mg by mouth 3 (three) times daily. For itching   iron polysaccharides 150 MG capsule Commonly known as: NIFEREX Take 150 mg by mouth daily with supper.   lactulose 10 GM/15ML solution Commonly known as: Constulose TAKE 45 MLS EVERY MORNING AND TAKE 45 MLS EVERY EVENING What changed:  how much to take how to take this when to take this additional instructions   levofloxacin 500 MG tablet Commonly known as: LEVAQUIN TAKE 1 TABLET BY MOUTH THREE TIMES EVERY WEEK AFTER DIALYSIS   metoprolol succinate 25 MG 24 hr tablet Commonly known as: TOPROL-XL Take 1 tablet (25 mg total) by mouth See admin instructions. Take one tablet (25 mg) by mouth at bedtime every night and take one tablet (25 mg) in morning on  Sun, Tues, Thurs and Sat (non-dialysis days)   midodrine 10 MG tablet Commonly known as: PROAMATINE Take 1 tablet (10 mg total) by mouth 2 (two) times daily with a meal.   milk thistle 175 MG tablet Take 175 mg by mouth 3 (three) times daily.   mirtazapine 30 MG tablet Commonly known as: REMERON Take 1 tablet (30 mg total) by  mouth at bedtime. What changed: when to take this   multivitamin with minerals Tabs tablet Take 1 tablet by mouth in the morning.   omega-3 acid ethyl esters 1 g capsule Commonly known as: LOVAZA Take 2 g by mouth 2 (two) times daily.   ondansetron 4 MG tablet Commonly known as: ZOFRAN Take 4 mg by mouth every 8 (eight) hours as needed for nausea or vomiting.   oxyCODONE-acetaminophen 5-325 MG tablet Commonly known as: PERCOCET/ROXICET Take 1 tablet by mouth every 4 (four) hours as needed for moderate pain.   pantoprazole 40 MG tablet Commonly known as: PROTONIX Take 2 tablets (80 mg total) by mouth daily.   polyethylene glycol-electrolytes 420 g solution Commonly known as: TriLyte Take 4,000 mLs by mouth as directed.   pravastatin 20 MG tablet Commonly known as: PRAVACHOL Take 20 mg by mouth in the morning.   PROBIOTIC DAILY PO Take 1 tablet by mouth in the morning.   spironolactone 50 MG tablet Commonly known as: ALDACTONE Take 50 mg by mouth in the morning.   thiamine 100 MG tablet Commonly known as: Vitamin B-1 Take 100 mg by mouth daily.   tiZANidine 2 MG tablet Commonly known as: ZANAFLEX Take 2 mg by mouth 2 (two) times daily as needed for muscle spasms.   TRI-VIT/FLUORIDE/IRON PO Take 1 tablet by mouth daily.   triamcinolone cream 0.1 % Commonly known as: KENALOG Apply 1 application topically 2 (two) times daily as needed (dry skin Itching).   venlafaxine 75 MG tablet Commonly known as: EFFEXOR Take 75 mg by mouth  3 (three) times daily with meals.   vitamin C 1000 MG tablet Take 1,000 mg by mouth in the morning.   Xifaxan 550 MG Tabs tablet Generic drug: rifaximin TAKE 1 TABLET BY MOUTH TWICE DAILY        Procedures/Studies: US Paracentesis  Result Date: 04/10/2021 INDICATION: Recurrent ascites EXAM: ULTRASOUND GUIDED LLQ PARACENTESIS MEDICATIONS: 10 cc 1% lidocaine. COMPLICATIONS: None immediate. PROCEDURE: Informed written consent was  obtained from the patient after a discussion of the risks, benefits and alternatives to treatment. A timeout was performed prior to the initiation of the procedure. Initial ultrasound scanning demonstrates a large amount of ascites within the left lower abdominal quadrant. The left lower abdomen was prepped and draped in the usual sterile fashion. 1% lidocaine was used for local anesthesia. Following this, a Yueh catheter was introduced. An ultrasound image was saved for documentation purposes. The paracentesis was performed. The catheter was removed and a dressing was applied. The patient tolerated the procedure well without immediate post procedural complication. Patient received post-procedure intravenous albumin; see nursing notes for details. FINDINGS: A total of approximately 2.4 liters of cloudy yellow fluid was removed. Samples were sent to the laboratory as requested by the clinical team. IMPRESSION: Successful ultrasound-guided paracentesis yielding 2.4 liters of peritoneal fluid. Read by Lavonia Drafts [facility] Electronically Signed   By: Lavonia Dana M.D.   On: 04/10/2021 10:14     Subjective: Pt reports that he feels well.    Discharge Exam: Vitals:   04/15/21 0304 04/15/21 0820  BP: (!) 155/75 (!) 146/69  Pulse: 93 77  Resp: 19   Temp: 98.1 F (36.7 C)   SpO2: 100%    Vitals:   04/14/21 1358 04/14/21 2052 04/15/21 0304 04/15/21 0820  BP: (!) 155/64 (!) 164/68 (!) 155/75 (!) 146/69  Pulse: 82 90 93 77  Resp: 19 20 19    Temp:  98.2 F (36.8 C) 98.1 F (36.7 C)   TempSrc:      SpO2: 100% 100% 100%   Weight:      Height:       General exam: pt appears chronically ill.  Appears calm and comfortable  Respiratory system: Clear to auscultation. Respiratory effort normal. Cardiovascular system: normal S1 & S2 heard. No JVD, murmurs, rubs, gallops or clicks. No pedal edema. Gastrointestinal system: Abdomen is mildly distended, soft and nontender. No organomegaly or masses felt. Normal  bowel sounds heard. Central nervous system: Alert and oriented. No focal neurological deficits. Extremities: Symmetric 5 x 5 power. Skin: No rashes, lesions or ulcers Psychiatry: Judgement and insight appear normal. Mood & affect appropriate.    The results of significant diagnostics from this hospitalization (including imaging, microbiology, ancillary and laboratory) are listed below for reference.     Microbiology: Recent Results (from the past 240 hour(s))  Culture, body fluid w Gram Stain-bottle     Status: None   Collection Time: 04/10/21  9:30 AM   Specimen: Peritoneal Washings  Result Value Ref Range Status   Specimen Description PERITONEAL  Final   Special Requests   Final    BOTTLES DRAWN AEROBIC AND ANAEROBIC Blood Culture results may not be optimal due to an excessive volume of blood received in culture bottles   Culture   Final    NO GROWTH 5 DAYS Performed at [facility], [address], [place], [place] [address]    Report Status 04/15/2021 FINAL  Final  Gram stain     Status: None   Collection Time:  04/10/21  9:30 AM   Specimen: Peritoneal Washings  Result Value Ref Range Status   Specimen Description PERITONEAL  Final   Special Requests NONE  Final   Gram Stain   Final    GRAM POSITIVE RODS IN CLUSTERS CYTOSPIN SMEAR Gram Stain Report Called to,Read Back By and Verified WithClayborne Dana RN 1300 04/10/21 K FORSYTH Performed at [facility], [address], [place], [place] [address]    Report Status 04/10/2021 FINAL  Final  Blood culture (routine x 2)     Status: None   Collection Time: 04/10/21  6:25 PM   Specimen: Right Antecubital; Blood  Result Value Ref Range Status   Specimen Description RIGHT ANTECUBITAL  Final   Special Requests   Final    Blood Culture adequate volume BOTTLES DRAWN AEROBIC AND ANAEROBIC   Culture   Final    NO GROWTH 5 DAYS Performed at [facility], [address], [place], [place] [address]    Report Status 04/15/2021 FINAL  Final   Blood culture (routine x 2)     Status: None   Collection Time: 04/10/21  6:38 PM   Specimen: BLOOD RIGHT ARM  Result Value Ref Range Status   Specimen Description BLOOD RIGHT ARM  Final   Special Requests   Final    Blood Culture adequate volume BOTTLES DRAWN AEROBIC AND ANAEROBIC   Culture   Final    NO GROWTH 5 DAYS Performed at [facility], [address], [place], [place] [address]    Report Status 04/15/2021 FINAL  Final  Resp Panel by RT-PCR (Flu A&B, Covid) Nasopharyngeal Swab     Status: None   Collection Time: 04/10/21  9:20 PM   Specimen: Nasopharyngeal Swab; Nasopharyngeal(NP) swabs in vial transport medium  Result Value Ref Range Status   SARS Coronavirus 2 by RT PCR NEGATIVE NEGATIVE Final    Comment: (NOTE) SARS-CoV-2 target nucleic acids are NOT DETECTED.  The SARS-CoV-2 RNA is generally detectable in upper respiratory specimens during the acute phase of infection. The lowest concentration of SARS-CoV-2 viral copies this assay can detect is 138 copies/mL. A negative result does not preclude SARS-Cov-2 infection and should not be used as the sole basis for treatment or other patient management decisions. A negative result may occur with  improper specimen collection/handling, submission of specimen other than nasopharyngeal swab, presence of viral mutation(s) within the areas targeted by this assay, and inadequate number of viral copies(<138 copies/mL). A negative result must be combined with clinical observations, patient history, and epidemiological information. The expected result is Negative.  Fact Sheet for Patients:  EntrepreneurPulse.com.au  Fact Sheet for Healthcare Providers:  IncredibleEmployment.be  This test is no t yet approved or cleared by the [place] FDA and  has been authorized for detection and/or diagnosis of SARS-CoV-2 by FDA under an Emergency Use Authorization (EUA). This EUA will remain  in  effect (meaning this test can be used) for the duration of the COVID-19 declaration under Section 564(b)(1) of the Act, 21 U.S.C.section 360bbb-3(b)(1), unless the authorization is terminated  or revoked sooner.       Influenza A by PCR NEGATIVE NEGATIVE Final   Influenza B by PCR NEGATIVE NEGATIVE Final    Comment: (NOTE) The Xpert Xpress SARS-CoV-2/FLU/RSV plus assay is intended as an aid in the diagnosis of influenza from Nasopharyngeal swab specimens and should not be used as a sole basis for treatment. Nasal washings and aspirates are unacceptable for Xpert Xpress SARS-CoV-2/FLU/RSV testing.  Fact Sheet for Patients: EntrepreneurPulse.com.au  Fact Sheet for Healthcare Providers: IncredibleEmployment.be  This test is not yet approved or cleared by the [place] FDA and has been authorized for detection and/or diagnosis of SARS-CoV-2 by FDA under an Emergency Use Authorization (EUA). This EUA will remain in effect (meaning this test can be used) for the duration of the COVID-19 declaration under Section 564(b)(1) of the Act, 21 U.S.C. section 360bbb-3(b)(1), unless the authorization is terminated or revoked.  Performed at [facility], [address], [place], [place] [address]      Labs: BNP (last 3 results) No results for input(s): BNP in the last 8760 hours. Basic Metabolic Panel: Recent Labs  Lab 04/10/21 1838 04/11/21 0521 04/13/21 0351 04/14/21 0446  NA 129* 132* 128* 125*  K 3.3* 3.3* 4.5 4.1  CL 91* 95* 91* 88*  CO2 27 27 27 26   GLUCOSE 216* 84 236* 204*  BUN 30* 32* 27* 33*  CREATININE 2.89* 3.15* 3.35* 3.88*  CALCIUM 8.4* 8.4* 8.5* 8.7*  MG  --  1.6*  --   --   PHOS  --  4.4 4.7* 5.1*   Liver Function Tests: Recent Labs  Lab 04/10/21 1838 04/11/21 0521 04/13/21 0351 04/14/21 0446 04/14/21 1023  AST 20 15  --   --  36  ALT 16 14  --   --  26  ALKPHOS 227* 162*  --   --  191*  BILITOT 0.7 1.0  --   --   1.0  PROT 7.0 6.0*  --   --  6.8  ALBUMIN 2.9* 2.6* 3.1* 3.7 3.4*   No results for input(s): LIPASE, AMYLASE in the last 168 hours. No results for input(s): AMMONIA in the last 168 hours. CBC: Recent Labs  Lab 04/10/21 1838 04/11/21 0521 04/14/21 0446  WBC 3.1* 3.2* 4.1  NEUTROABS 2.2 2.3  --   HGB 8.8* 8.8* 8.9*  HCT 28.0* 28.4* 27.8*  MCV 105.3* 106.8* 104.5*  PLT 95* 89* 73*   Cardiac Enzymes: No results for input(s): CKTOTAL, CKMB, CKMBINDEX, TROPONINI in the last 168 hours. BNP: Invalid input(s): POCBNP CBG: Recent Labs  Lab 04/14/21 2050 04/15/21 0303 04/15/21 0713 04/15/21 1113 04/15/21 1134  GLUCAP 299* 224* 227* 313* 317*   D-Dimer No results for input(s): DDIMER in the last 72 hours. Hgb A1c No results for input(s): HGBA1C in the last 72 hours. Lipid Profile No results for input(s): CHOL, HDL, LDLCALC, TRIG, CHOLHDL, LDLDIRECT in the last 72 hours. Thyroid function studies No results for input(s): TSH, T4TOTAL, T3FREE, THYROIDAB in the last 72 hours.  Invalid input(s): FREET3 Anemia work up No results for input(s): VITAMINB12, FOLATE, FERRITIN, TIBC, IRON, RETICCTPCT in the last 72 hours. Urinalysis    Component Value Date/Time   COLORURINE YELLOW 07/09/2020 1223   APPEARANCEUR HAZY (A) 07/09/2020 1223   LABSPEC 1.005 07/09/2020 1223   PHURINE 7.0 07/09/2020 1223   GLUCOSEU 50 (A) 07/09/2020 1223   HGBUR SMALL (A) 07/09/2020 1223   BILIRUBINUR NEGATIVE 07/09/2020 1223   KETONESUR NEGATIVE 07/09/2020 1223   PROTEINUR 30 (A) 07/09/2020 1223   NITRITE NEGATIVE 07/09/2020 1223   LEUKOCYTESUR LARGE (A) 07/09/2020 1223   Sepsis Labs Invalid input(s): PROCALCITONIN,  WBC,  LACTICIDVEN Microbiology Recent Results (from the past 240 hour(s))  Culture, body fluid w Gram Stain-bottle     Status: None   Collection Time: 04/10/21  9:30 AM   Specimen: Peritoneal Washings  Result Value Ref Range Status   Specimen Description PERITONEAL  Final   Special  Requests   Final    BOTTLES DRAWN AEROBIC AND ANAEROBIC Blood Culture results may not be optimal due to an excessive volume of blood received in culture bottles   Culture   Final    NO GROWTH 5 DAYS Performed at [facility], [address], [place], [place] [address]    Report Status 04/15/2021 FINAL  Final  Gram stain     Status: None   Collection Time: 04/10/21  9:30 AM   Specimen: Peritoneal Washings  Result Value Ref Range Status   Specimen Description PERITONEAL  Final   Special Requests NONE  Final   Gram Stain   Final    GRAM POSITIVE RODS IN CLUSTERS CYTOSPIN SMEAR Gram Stain Report Called to,Read Back By and Verified WithClayborne Dana RN 1300 04/10/21 K FORSYTH Performed at [facility], [address], [place], [place] [address]    Report Status 04/10/2021 FINAL  Final  Blood culture (routine x 2)     Status: None   Collection Time: 04/10/21  6:25 PM   Specimen: Right Antecubital; Blood  Result Value Ref Range Status   Specimen Description RIGHT ANTECUBITAL  Final   Special Requests   Final    Blood Culture adequate volume BOTTLES DRAWN AEROBIC AND ANAEROBIC   Culture   Final    NO GROWTH 5 DAYS Performed at [facility], [address] [address], [place], [place] [id number]    Report Status 04/15/2021 FINAL  Final  Blood culture (routine x 2)     Status: None   Collection Time: 04/10/21  6:38 PM   Specimen: BLOOD RIGHT ARM  Result Value Ref Range Status   Specimen Description BLOOD RIGHT ARM  Final   Special Requests   Final    Blood Culture adequate volume BOTTLES DRAWN AEROBIC AND ANAEROBIC   Culture   Final    NO GROWTH 5 DAYS Performed at [facility], [address], [place], [place] [address]    Report Status 04/15/2021 FINAL  Final  Resp Panel by RT-PCR (Flu A&B, Covid) Nasopharyngeal Swab     Status: None   Collection Time: 04/10/21  9:20 PM   Specimen: Nasopharyngeal Swab; Nasopharyngeal(NP) swabs in vial transport medium  Result Value Ref Range Status   SARS  Coronavirus 2 by RT PCR NEGATIVE NEGATIVE Final    Comment: (NOTE) SARS-CoV-2 target nucleic acids are NOT DETECTED.  The SARS-CoV-2 RNA is generally detectable in upper respiratory specimens during the acute phase of infection. The lowest concentration of SARS-CoV-2 viral copies this assay can detect is 138 copies/mL. A negative result does not preclude SARS-Cov-2 infection and should not be used as the sole basis for treatment or other patient management decisions. A negative result may occur with  improper specimen collection/handling, submission of specimen other than nasopharyngeal swab, presence of viral mutation(s) within the areas targeted by this assay, and inadequate number of viral copies(<138 copies/mL). A negative result must be combined with clinical observations, patient history, and epidemiological information. The expected result is Negative.  Fact Sheet for Patients:  EntrepreneurPulse.com.au  Fact Sheet for Healthcare Providers:  IncredibleEmployment.be  This test is no t yet approved or cleared by the [place] FDA and  has been authorized for detection and/or diagnosis of SARS-CoV-2 by FDA under an Emergency Use Authorization (EUA). This EUA will remain  in effect (meaning this test can be used) for the duration of the COVID-19 declaration under Section 564(b)(1) of the Act, 21 U.S.C.section  360bbb-3(b)(1), unless the authorization is terminated  or revoked sooner.       Influenza A by PCR NEGATIVE NEGATIVE Final   Influenza B by PCR NEGATIVE NEGATIVE Final    Comment: (NOTE) The Xpert Xpress SARS-CoV-2/FLU/RSV plus assay is intended as an aid in the diagnosis of influenza from Nasopharyngeal swab specimens and should not be used as a sole basis for treatment. Nasal washings and aspirates are unacceptable for Xpert Xpress SARS-CoV-2/FLU/RSV testing.  Fact Sheet for  Patients: EntrepreneurPulse.com.au  Fact Sheet for Healthcare Providers: IncredibleEmployment.be  This test is not yet approved or cleared by the [place] FDA and has been authorized for detection and/or diagnosis of SARS-CoV-2 by FDA under an Emergency Use Authorization (EUA). This EUA will remain in effect (meaning this test can be used) for the duration of the COVID-19 declaration under Section 564(b)(1) of the Act, 21 U.S.C. section 360bbb-3(b)(1), unless the authorization is terminated or revoked.  Performed at [facility], [address] [place], [place] [address]    Time coordinating discharge:   SIGNED:  Irwin Brakeman, MD  [facility] 04/15/2021, 12:33 PM How to contact the [facility] Attending or Consulting provider [place] or covering provider during after hours [place], for this patient?  Check the care team in [facility] and look for a) attending/consulting [facility] provider listed and b) the [facility] team listed Log into [email or web address] and use [facility]'s universal password to access. If you do not have the password, please contact the hospital operator. Locate the [facility] provider you are looking for under [facility] and page to a number that you can be directly reached. If you still have difficulty reaching the provider, please page the [facility] (Director on Call) for the Hospitalists listed on amion for assistance.

## 2021-04-27 NOTE — Progress Notes (Signed)
[address] [place], [place] [address]   CLINIC:  Medical Oncology/Hematology  PCP:  Monico Blitz, MD [place] [address] [phone]   REASON FOR VISIT:  Follow-up for iron deficiency anemia, anemia of ESRD   PRIOR THERAPY: Intermittent IV iron supplementation   CURRENT THERAPY: Epogen (at dialysis), IV iron supplementation, PRBC transfusions as needed, monthly B12 injections  INTERVAL HISTORY:  Jeffrey [name] 74 y.o. male returns for routine follow-up of his iron deficiency anemia, anemia of ESRD, thrombocytopenia, and leukopenia.  He was last seen by Tarri Abernethy PA-C on 01/13/2021.  Patient has pancytopenia of mixed etiology.  His anemia is related to ESRD (receives Epogen at dialysis), end-stage liver disease/cirrhosis, and intermittent GI bleeding.  He is now transfusion dependent, and has been receiving PRBC transfusions on average every 4 weeks, although he has not required any blood transfusions since his hospital stay in August 2022 for GI bleeding, during which he received 4 units PRBC transfusions.  His last iron infusions were IV Venofer on 11/28/2020, 12/05/2020, and 12/08/2020.  Since his last visit, he has been hospitalized twice. First hospitalization was from 01/17/2021 through 01/20/2021 with rectal bleeding and acute blood loss anemia (Hgb 5.9).  Colonoscopy (01/19/2021) showed benign polypoid lesion at splenic flexure, likely inflammatory polyp thought to be source of bleeding.  He received 4 units PRBC during this hospital stay. He was hospitalized again from 04/10/2021 through 04/15/2021 due to spontaneous bacterial peritonitis.  At today's visit, he reports feeling fair and at his usual baseline.    He has not had any recurrent rectal bleeding since his hospitalization in August 2022.  No other signs of blood loss such as hematemesis, melena, hematuria, or epistaxis. He has his baseline fatigue with energy about 25%.  He has  chronic pica symptoms and eats large amounts of ice, regardless of his iron levels.  He has chronic headaches and lightheadedness with standing, but has not had any syncopal episodes.  Chronic dyspnea on exertion is unchanged.  No chest pain.  No B symptoms such as fever, chills, night sweats, unintentional weight loss.  He continues to bruise easily but denies any petechial rash.  Apart from SBP in November 2022, he denies any other recent or frequent infections.  He has 25% energy and 25% appetite. He endorses that he is maintaining a relatively stable weight, although it fluctuates frequently due to fluid overload treated with dialysis and paracentesis.    REVIEW OF SYSTEMS:  Review of Systems  Constitutional:  Positive for fatigue (25% energy). Negative for appetite change, chills, diaphoresis, fever and unexpected weight change.  HENT:   Positive for trouble swallowing (Difficulty chewing due to false teeth). Negative for lump/mass and nosebleeds.   Eyes:  Negative for eye problems.  Respiratory:  Positive for shortness of breath (With exertion, uses 3 L supplemental O2 with exertion). Negative for cough and hemoptysis.   Cardiovascular:  Negative for chest pain, leg swelling and palpitations.  Gastrointestinal:  Positive for nausea and vomiting. Negative for abdominal pain, blood in stool, constipation and diarrhea.       Increased burping  Genitourinary:  Hematuria: now resolved.        Chronic catheter  Musculoskeletal:  Positive for back pain.  Skin: Negative.   Neurological:  Positive for dizziness, headaches (occasional), light-headedness and numbness (Tingling in feet).  Hematological:  Bruises/bleeds easily (bruising).  Psychiatric/Behavioral:  Positive for depression. The patient is nervous/anxious.  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 difficile colitis    Cholelithiasis    Chronic kidney disease    Dialysis M/W/F/Sa   Depression    Diabetes ([place])    Duodenal ulcer with hemorrhage    per patient in 2006 or 2007, records have been requested   Dyspnea    low oxygen level -  has O2 2 L all day   Esophageal varices ([place])    see [facility]   GERD (gastroesophageal reflux disease)    Heart burn    History of alcohol abuse    quit 05/2013   HOH (hard of hearing)    Hypertension    Liver cirrhosis ([place])    ETOH   SBP (spontaneous bacterial peritonitis) ([place])    2020, November 2021.    Splenomegaly    Ultrasound in 2006   Past Surgical History:  Procedure Laterality Date   AGILE CAPSULE N/A 09/20/2019   Procedure: AGILE CAPSULE;  Surgeon: Daneil Dolin, MD;  [facility];  Service: Endoscopy;  Laterality: N/A;   APPENDECTOMY     AV FISTULA PLACEMENT Left 08/17/2019   Procedure: ARTERIOVENOUS (AV) FISTULA CREATION VERSES ARTERIOVENOUS GRAFT;  Surgeon: [name], MD;  Location: [facility] OR;  Service: Vascular;  Laterality: Left;   BASCILIC VEIN TRANSPOSITION Left 09/28/2019   Procedure: LEFT SECOND STAGE [place];  Surgeon: [name], MD;  Location: [facility];  Service: Vascular;  Laterality: Left;   BIOPSY  02/03/2018   Procedure: BIOPSY;  Surgeon: Daneil Dolin, MD;  [facility];  Service: Endoscopy;;  bx of gastric polyps   BIOPSY  09/19/2019   Procedure: BIOPSY;  Surgeon: Danie Binder, MD;  [facility];  Service: Endoscopy;;   BIOPSY  01/16/2021   Procedure: BIOPSY;  Surgeon: Daneil Dolin, MD;  [facility];  Service: Endoscopy;;   CATARACT EXTRACTION Right    COLONOSCOPY   03/10/2005   Rectal polyp as described above, removed with snare. Left sided  diverticula. The remainder of the colonic mucosa appeared normal. Inflammed polyp on path.   COLONOSCOPY  04/1999   Dr. Thea Silversmith polyps removed,  Path showed hyperplastic   COLONOSCOPY  10/2015   Dr. Britta Mccreedy: diverticulosis, single sessile tubular adenoma 3-36m in size removed from descending colon.    COLONOSCOPY WITH ESOPHAGOGASTRODUODENOSCOPY (EGD)  03/31/2012   RFTD:[id number]. Colonic diverticulosis. tubular adenoma colon   COLONOSCOPY WITH ESOPHAGOGASTRODUODENOSCOPY (EGD)  06/2019   FORSYTH: small esophageal varices without high risk stigmata, single large AVM on lesser curvature in gastric body s/p APC ablation, gastric antral and duodenal bulb polyposis. No significant source to explain transfusion dependent anemia. Colonoscopy with portal colopathy and diffuse edema, changes of Cdiff colitis on colonoscopy   COLONOSCOPY WITH PROPOFOL N/A 01/19/2021   Surgeon: CDaryel November MD; 15 mm polyp in descending colon removed piecemeal, 6 mm polyp in ascending colon and 12 mm polyp in sigmoid colon not removed due to high  bleeding risk, benign polypoid lesion at splenic flexure, diverticulosis in sigmoid and descending colon, rectal varices.   COLONOSCOPY WITH PROPOFOL N/A 01/16/2021   Surgeon: Daneil Dolin, MD;  Portal colopathy, large rectal varices, polypoid mass at splenic flexure biopsy, two 1 cm left colon polyps not removed, left-sided diverticulosis.  Reported patient had bleeding from splenic flexure lesion.  Pathology with acutely inflamed colonic mucosa with localized ulceration and prominent reactive changes.   ESOPHAGEAL BANDING N/A 02/03/2018   Procedure: ESOPHAGEAL BANDING;  Surgeon: Daneil Dolin, MD;  [facility];  Service: Endoscopy;  Laterality: N/A;   ESOPHAGEAL BANDING  01/16/2021   Procedure: ESOPHAGEAL BANDING;  Surgeon: Daneil Dolin, MD;  [facility];   Service: Endoscopy;;   ESOPHAGOGASTRODUODENOSCOPY  01/15/2005   Three columns grade 1 to 2 esophageal varices, otherwise normal esophageal mucosa.  Esophagus was not manipulated otherwise./Nodularity of the antrum with overlying erosions, nonspecific finding. Path showed rare H.pylori   ESOPHAGOGASTRODUODENOSCOPY  09/2008   Dr. Gaylene Brooks columns of grade 2-3 esoph varices, only one column was prominent. Portal gastropathy, multiple gastrc polyps at antrum, two were 2cm with black eschar, bulbar polyps, bulbar erosions   ESOPHAGOGASTRODUODENOSCOPY  11/2004   Dr. Brantley Stage 3 esoph varices   ESOPHAGOGASTRODUODENOSCOPY  03/31/2012   RMR: 4 columns(3-GR 2, 1-GR1) non-bleeding esophageal varices, portal gastropathy, small HH, early GAVE, multiple gastric polyps    ESOPHAGOGASTRODUODENOSCOPY (EGD) WITH PROPOFOL N/A 02/03/2018   Dr. Gala Romney: Esophageal varices, 3 columns grade 1-2.  Portal hypertensive gastropathy.  Multiple gastric polyps, biopsy consistent with hyperplastic.   ESOPHAGOGASTRODUODENOSCOPY (EGD) WITH PROPOFOL N/A 09/19/2019   Fields: grade I and II esophageal varcies, mild portal hypertensive gastropathy, moderate gastritis but no H. pylori, single hyperplastic gastric polyp removed, obvious source for melena/transfusion dependent anemia not identified, may be due to friable gastric and duodenal mucosa in the setting of portal hypertension   ESOPHAGOGASTRODUODENOSCOPY (EGD) WITH PROPOFOL N/A 01/16/2021   Surgeon: Daneil Dolin, MD; Portal hypertensive gastropathy, grade 2 esophageal varices with bleeding stigmata s/p banding x4, slight nodularity and congestion involving bulb and second portion of duodenum, nonspecific.  Recommended repeat EGD in 4 weeks.   GASTRIC VARICES BANDING  03/31/2012   Procedure: GASTRIC VARICES BANDING;  Surgeon: Daneil Dolin, MD;  [facility];  Service: Endoscopy;  Laterality: N/A;   GIVENS CAPSULE STUDY N/A 09/21/2019   Poor study with  a lot of debris obstructing much of the view of the first approximate 3 hours out of 6 hours.  No obvious source of bleeding identified.  Sequela of bleeding and old blood seen in the form of"whisps" of blood, blood flecks mostly toward the end of the study.   HEMOSTASIS CLIP PLACEMENT  01/19/2021   Procedure: HEMOSTASIS CLIP PLACEMENT;  Surgeon: [name], MD;  Location: [facility] ENDOSCOPY;  Service: Gastroenterology;;   IR REMOVAL TUN CV CATH W/O FL  02/15/2020   POLYPECTOMY  09/19/2019   Procedure: POLYPECTOMY;  Surgeon: Danie Binder, MD;  [facility];  Service: Endoscopy;;   POLYPECTOMY  01/19/2021   Procedure: POLYPECTOMY;  Surgeon: [name], MD;  Location: [facility];  Service: Gastroenterology;;   UMBILICAL HERNIA REPAIR  2017   exploratory laparotomy, abdominal washout     SOCIAL HISTORY:  Social History   Socioeconomic History   Marital status: Married    Spouse name: Not on file   Number of children: 2   Years of education: Not on file  Highest education level: Not on file  Occupational History   Occupation: RETIRED    Employer: SELF EMPLOYED  Tobacco Use   Smoking status: Former    Packs/day: 2.00    Years: 25.00    Pack years: 50.00    Types: Cigarettes    Quit date: 01/28/1986    Years since quitting: 35.2   Smokeless tobacco: Never  Vaping Use   Vaping Use: Never used  Substance and Sexual Activity   Alcohol use: No    Comment: quit in 05/2013   Drug use: No   Sexual activity: Not Currently  Other Topics Concern   Not on file  Social History Narrative   Two step children from second marriage (divorced) who live with him along with some grandchildren.    Social Determinants of Radio broadcast assistant Strain: Not on file  Food Insecurity: No Food Insecurity   Worried About Charity fundraiser in the Last Year: Never true   Arboriculturist in the Last Year: Never true  Transportation Needs: Not on file  Physical  Activity: Inactive   Days of Exercise per Week: 0 days   Minutes of Exercise per Session: 0 min  Stress: Not on file  Social Connections: Not on file  Intimate Partner Violence: Not At Risk   Fear of Current or Ex-Partner: No   Emotionally Abused: No   Physically Abused: No   Sexually Abused: No    FAMILY HISTORY:  Family History  Problem Relation Age of Onset   Colon cancer Father 27       deceased age 47   Breast cancer Sister        deceased   Diabetes Sister    Stroke Mother    Healthy Son    Healthy Daughter    Lung cancer Neg Hx    Ovarian cancer Neg Hx     CURRENT MEDICATIONS:  Outpatient Encounter Medications as of 04/29/2021  Medication Sig Note   acetaminophen (TYLENOL) 650 MG CR tablet Take 650 mg by mouth every 8 (eight) hours as needed for pain.    Alcohol Swabs (ALCOHOL PREP) 70 % PADS Apply 1 each topically 3 (three) times daily.    ALPRAZolam (XANAX) 0.5 MG tablet Take 0.5 mg by mouth See admin instructions. Take one tablet (0.5 mg) by mouth on Monday, Wednesday, Friday before dialysis, may also take one tablet (0.5 mg) daily as needed for anxiety.    Ascorbic Acid (VITAMIN C) 1000 MG tablet Take 1,000 mg by mouth in the morning.    calcium carbonate (TUMS - DOSED IN MG ELEMENTAL CALCIUM) 500 MG chewable tablet Chew 1 tablet by mouth 3 (three) times daily with meals.    Calcium Carbonate-Simethicone (ALKA-SELTZER HEARTBURN + GAS) 750-80 MG CHEW Chew 1-2 tablets by mouth 2 (two) times daily as needed (acid reflux).    diltiazem (TIAZAC) 120 MG 24 hr capsule Take 120 mg by mouth daily.    furosemide (LASIX) 40 MG tablet Take 80 mg by mouth daily.    gabapentin (NEURONTIN) 100 MG capsule TAKE 1 CAPSULE BY MOUTH AT BEDTIME (Patient taking differently: Take 100 mg by mouth daily with supper.)    GLUCAGEN HYPOKIT 1 MG SOLR injection Inject 1 mg into the muscle once as needed for low blood sugar.    hydrocortisone (ANUSOL-HC) 2.5 % rectal cream Place 1 application  rectally 2 (two) times daily as needed for hemorrhoids or anal itching.    hydrOXYzine (ATARAX/VISTARIL)  25 MG tablet Take 25 mg by mouth 3 (three) times daily. For itching    iron polysaccharides (NIFEREX) 150 MG capsule Take 150 mg by mouth daily with supper. 01/14/2021: Last dose was on 01/08/2021. On hold for procedure.   lactulose (CONSTULOSE) 10 GM/15ML solution TAKE 45 MLS EVERY MORNING AND TAKE 45 MLS EVERY EVENING (Patient taking differently: Take 30 g by mouth See admin instructions. Take 45 mls (30 g) by mouth on Sunday, Tuesday, Thursday, Saturday mornings (non-dialysis days) unless going to MD appointment; take 45 mls (30 g) daily at bedtime)    levofloxacin (LEVAQUIN) 500 MG tablet TAKE 1 TABLET BY MOUTH THREE TIMES EVERY WEEK AFTER DIALYSIS    metoprolol succinate (TOPROL-XL) 25 MG 24 hr tablet Take 1 tablet (25 mg total) by mouth See admin instructions. Take one tablet (25 mg) by mouth at bedtime every night and take one tablet (25 mg) in morning on  Sun, Tues, Thurs and Sat (non-dialysis days)    midodrine (PROAMATINE) 10 MG tablet Take 1 tablet (10 mg total) by mouth 2 (two) times daily with a meal.    milk thistle 175 MG tablet Take 175 mg by mouth 3 (three) times daily.     mirtazapine (REMERON) 30 MG tablet Take 1 tablet (30 mg total) by mouth at bedtime. (Patient taking differently: Take 30 mg by mouth daily with supper.)    Multiple Vitamin (MULTIVITAMIN WITH MINERALS) TABS tablet Take 1 tablet by mouth in the morning.     omega-3 acid ethyl esters (LOVAZA) 1 g capsule Take 2 g by mouth 2 (two) times daily.     ondansetron (ZOFRAN) 4 MG tablet Take 4 mg by mouth every 8 (eight) hours as needed for nausea or vomiting.    oxyCODONE-acetaminophen (PERCOCET/ROXICET) 5-325 MG tablet Take 1 tablet by mouth every 4 (four) hours as needed for moderate pain.    pantoprazole (PROTONIX) 40 MG tablet Take 2 tablets (80 mg total) by mouth daily.    Ped Vitamins ACD Fl-Iron  (TRI-VIT/FLUORIDE/IRON PO) Take 1 tablet by mouth daily.    polyethylene glycol-electrolytes (TRILYTE) 420 g solution Take 4,000 mLs by mouth as directed.    pravastatin (PRAVACHOL) 20 MG tablet Take 20 mg by mouth in the morning.     Probiotic Product (PROBIOTIC DAILY PO) Take 1 tablet by mouth in the morning.     spironolactone (ALDACTONE) 50 MG tablet Take 50 mg by mouth in the morning.     thiamine (VITAMIN B-1) 100 MG tablet Take 100 mg by mouth daily.    tiZANidine (ZANAFLEX) 2 MG tablet Take 2 mg by mouth 2 (two) times daily as needed for muscle spasms.     triamcinolone cream (KENALOG) 0.1 % Apply 1 application topically 2 (two) times daily as needed (dry skin Itching).     venlafaxine (EFFEXOR) 75 MG tablet Take 75 mg by mouth 3 (three) times daily with meals.     XIFAXAN 550 MG TABS tablet TAKE 1 TABLET BY MOUTH TWICE DAILY    No facility-administered encounter medications on file as of 04/29/2021.    ALLERGIES:  Allergies  Allergen Reactions   Tape Itching, Rash and Other (See Comments)    Tears skin     PHYSICAL EXAM:  ECOG PERFORMANCE STATUS: 2 - Symptomatic, <50% confined to bed  There were no vitals filed for this visit. There were no vitals filed for this visit. Physical Exam Constitutional:      Appearance: Normal appearance. He is obese.  HENT:     Head: Normocephalic and atraumatic.     Mouth/Throat:     Mouth: Mucous membranes are moist.  Eyes:     Extraocular Movements: Extraocular movements intact.     Pupils: Pupils are equal, round, and reactive to light.  Cardiovascular:     Rate and Rhythm: Normal rate and regular rhythm.     Pulses: Normal pulses.     Heart sounds: Murmur heard.  Pulmonary:     Effort: Pulmonary effort is normal.     Breath sounds: Decreased breath sounds and rales present.     Comments: Faint crackles and decreased air movement bilaterally Abdominal:     General: Bowel sounds are normal. There is distension.     Palpations:  Abdomen is soft.     Tenderness: There is no abdominal tenderness.  Musculoskeletal:        General: No swelling.     Right lower leg: Edema (1+ edema with dusky red hyperpigmentation of skin) present.     Left lower leg: Edema (1+ edema with dusky red hyperpigmentation of skin) present.  Lymphadenopathy:     Cervical: No cervical adenopathy.  Skin:    General: Skin is warm and dry.  Neurological:     General: No focal deficit present.     Mental Status: He is alert and oriented to person, place, and time.  Psychiatric:        Mood and Affect: Mood normal.        Behavior: Behavior normal.     LABORATORY DATA:  I have reviewed the labs as listed.  CBC    Component Value Date/Time   WBC 4.1 04/14/2021 0446   RBC 2.66 (L) 04/14/2021 0446   HGB 8.9 (L) 04/14/2021 0446   HCT 27.8 (L) 04/14/2021 0446   HCT 28 02/25/2012 1034   PLT 73 (L) 04/14/2021 0446   PLT 125 02/25/2012 1034   MCV 104.5 (H) 04/14/2021 0446   MCV 82.0 02/25/2012 1034   MCH 33.5 04/14/2021 0446   MCHC 32.0 04/14/2021 0446   RDW 16.7 (H) 04/14/2021 0446   LYMPHSABS 0.4 (L) 04/11/2021 0521   MONOABS 0.3 04/11/2021 0521   EOSABS 0.3 04/11/2021 0521   BASOSABS 0.0 04/11/2021 0521   CMP Latest Ref Rng & Units 04/14/2021 04/13/2021 04/11/2021  Glucose 70 - 99 mg/dL 204(H) 236(H) 84  BUN 8 - 23 mg/dL 33(H) 27(H) 32(H)  Creatinine 0.61 - 1.24 mg/dL 3.88(H) 3.35(H) 3.15(H)  Sodium 135 - 145 mmol/L 125(L) 128(L) 132(L)  Potassium 3.5 - 5.1 mmol/L 4.1 4.5 3.3(L)  Chloride 98 - 111 mmol/L 88(L) 91(L) 95(L)  CO2 22 - 32 mmol/L _0 Calcium 8.9 - 10.3 mg/dL 8.7(L) 8.5(L) 8.4(L)  Total Protein 6.5 - 8.1 g/dL 6.8 - 6.0(L)  Total Bilirubin 0.3 - 1.2 mg/dL 1.0 - 1.0  Alkaline Phos 38 - 126 U/L 191(H) - 162(H)  AST 15 - 41 U/L 36 - 15  ALT 0 - 44 U/L 26 - 14    DIAGNOSTIC IMAGING:  I have independently reviewed the relevant imaging and discussed with the patient.  ASSESSMENT & PLAN: 1.  Pancytopenia of  mixed etiology - Anemia secondary to ESRD, cirrhosis, and GI blood loss - Leukopenia and thrombocytopenia secondary to splenomegaly - Most recent labs (04/08/2021): WBC 3.7, decreased lymphocytes 0.2, platelets 82, Hgb 9.0/MCV 105.9 - MDS has not yet been investigated or ruled out via bone marrow biopsy, as there would be no change in the  patient's treatment plan regardless of results.  Due to his overall fragile state of health with poor prognosis, supportive care remains his best treatment option; he would not be a candidate for any chemotherapeutic options. - PLAN: Blood counts are stable at baseline.  We will defer bone marrow biopsy at this time.  Further plan as below.   2.  Macrocytic anemia, transfusion-dependent - Anemia is related to his ESRD (receives Epogen at dialysis), end-stage liver disease/cirrhosis, and intermittent GI bleeding.  He is now transfusion dependent, and has been receiving PRBC transfusions on average every 4 weeks. - Intermittent melena with Hemoccult positive stool and extensive work-up for gastrointestinal blood loss: EGD (09/19/2019): Grade 1 and grade 2 esophageal varices without stigmata of recent bleeding, mild portal hypertensive gastropathy, moderate gastritis, gastric polyp - " no obvious source for melena and transfusion dependent anemia identified, may be due to friable gastric and duodenal mucosa in the setting of portal hypertension" Capsule endoscopy in April 2021 was unrevealing Most recent colonoscopy (11/29/2015): Mild diverticulosis, polyp x1 Stool occult blood positive x3 (08/15/2020) - Hospitalized from 01/17/2021 through 01/20/2021 with rectal bleeding and acute blood loss anemia (Hgb 5.9).  Colonoscopy (01/19/2021) showed benign polypoid lesion at splenic flexure, likely inflammatory polyp thought to be source of bleeding.  He received 4 units PRBC during this hospital stay. - Work-up with SPEP and immunofixation was negative. Hemolysis labs were negative  with normal LDH and reticulocyte count. - Macrocytosis secondary to cirrhotic liver disease - Most recent IV iron (Venofer) June/July 2022 - Receives Epogen at dialysis - Last PRBC transfusion x4 during hospitalization in August 2022 - Most recent labs (04/08/2021): Hgb 9.0/MCV 105.9, ferritin 352, iron saturation 31%. - Denies any current melena or bright red blood per rectum  - Symptomatic with chronic fatigue at baseline, and other chronic symptoms related in HPI/ROS above - PLAN: No indication for PRBC transfusion or IV iron at this time. - Continue CBC with sample to blood bank every 4 weeks.  PRBC transfusion every 4 weeks for Hgb < 7.0 (or Hgb <8.0 with symptoms). - RTC in 3 months with full lab panel before visit.   3.  Thrombocytopenia and leukopenia -Thrombocytopenia and leukopenia due to are thought to be secondary to his chronic liver disease and splenomegaly. -CTAP on 04/01/2020 shows persistent splenomegaly - Most recent labs (04/08/2021): WBC 3.7, with lymphocytes 0.2, platelets 82.  Labs are at patient's baseline.  No B12 or folate deficiencies when last checked. - Hospitalized for SBP in November 2022.  No other recent infections. - Patient denies frequent infections.  Reports easy bruising but denies significant bleeding events.   - PLAN: Continue to monitor with repeat CBCs.   4.  B12 deficiency - Anemia mildly macrocytic (in part due to liver disease) - Noted at last appointment to have persistent B12 deficiency despite oral repletion, and was therefore switched to injections - Patient is receiving monthly B12 injections  - Most recent labs (04/08/2021): B12 elevated at > 7,500 (may be acute phase reactant, as patient developed SBP at about the same time).  Methylmalonic acid remains mildly elevated at 476. - PLAN: Continue monthly B12 injections.  Reassess in 3 months with repeat labs.   5.  End-stage renal disease: -He gets dialysis Monday-Wednesday-Friday -He receives  Epogen injections at his dialysis.   6. Cirrhosis / End-stage Liver Disease - Receives paracentesis every 2 weeks - Follows regularly with gastroenterology  7.  Low blood pressure - BP today was 93/50, which  is much lower than the patient's baseline - He is asymptomatic, denies any new weakness, chest pain, lightheadedness, dizziness, or mental status changes - He has not taken his midodrine yet today - Patient instructed to take his midodrine as soon as he returns home, and to recheck his blood pressure an hour after he takes it.  He is instructed to call his PCP for further instructions if repeat blood pressure is still less than 110/60.    PLAN SUMMARY & DISPOSITION: - No IV iron or transfusion at this appointment - Continue CBC with sample to blood bank every 4 weeks, transfuse if indicated - B12 injection every 4 weeks - Full lab panel and office visit in 3 months    All questions were answered. The patient knows to call the clinic with any problems, questions or concerns.  Medical decision making: Moderate  Time spent on visit: I spent 25 minutes counseling the patient face to face. The total time spent in the appointment was 40 minutes and more than 50% was on counseling.   Harriett Rush, PA-C  04/29/21 10:56 AM

## 2021-04-29 ENCOUNTER — Other Ambulatory Visit: Payer: Self-pay

## 2021-04-29 ENCOUNTER — Inpatient Hospital Stay (HOSPITAL_BASED_OUTPATIENT_CLINIC_OR_DEPARTMENT_OTHER): Payer: Medicare Other | Admitting: Physician Assistant

## 2021-04-29 VITALS — BP 93/50 | HR 71 | Temp 96.8°F | Resp 18 | Wt 208.3 lb

## 2021-04-29 DIAGNOSIS — D72819 Decreased white blood cell count, unspecified: Secondary | ICD-10-CM | POA: Diagnosis not present

## 2021-04-29 DIAGNOSIS — D631 Anemia in chronic kidney disease: Secondary | ICD-10-CM | POA: Insufficient documentation

## 2021-04-29 DIAGNOSIS — E538 Deficiency of other specified B group vitamins: Secondary | ICD-10-CM | POA: Insufficient documentation

## 2021-04-29 DIAGNOSIS — N186 End stage renal disease: Secondary | ICD-10-CM | POA: Insufficient documentation

## 2021-04-29 DIAGNOSIS — D61818 Other pancytopenia: Secondary | ICD-10-CM | POA: Insufficient documentation

## 2021-04-29 DIAGNOSIS — K7460 Unspecified cirrhosis of liver: Secondary | ICD-10-CM | POA: Diagnosis not present

## 2021-04-29 DIAGNOSIS — D696 Thrombocytopenia, unspecified: Secondary | ICD-10-CM | POA: Diagnosis not present

## 2021-04-29 DIAGNOSIS — D500 Iron deficiency anemia secondary to blood loss (chronic): Secondary | ICD-10-CM | POA: Diagnosis not present

## 2021-04-29 DIAGNOSIS — R031 Nonspecific low blood-pressure reading: Secondary | ICD-10-CM | POA: Diagnosis not present

## 2021-04-29 NOTE — Patient Instructions (Addendum)
[place] at [facility] Discharge Instructions  You were seen today by Tarri Abernethy PA-C for your low blood counts.  As we have discussed, your low white blood cells and platelets is related to your liver disease.  Your white blood cells and platelets remain stable within their baseline range.  Unless there are any large changes, we will not need to actively treat your low white blood cells or low platelets.  We will continue to monitor them though with repeat labs.  Your low red blood cell/low hemoglobin is related to your chronic kidney disease as well as GI blood loss.  Your blood counts today are improved (hemoglobin 9.0), so you do not need any blood transfusion this week.  Your iron labs are normal, so you do not need IV iron.  Continue to check CBC every 4 weeks to see if you need any blood transfusions.  **LOW BLOOD PRESSURE: Your blood pressure today was 93/50.  This is most likely because you did not take your MIDODRINE yet today.  Make sure that you take your midodrine medication as soon as you get home.  Check your blood pressure again 1 hour after taking midodrine, and if it is still less than 110/60, call your primary care provider for further instructions.  Be on the look out for symptoms of low blood pressure such as worsening fatigue, dizziness, lightheadedness, and passing out.  LABS: Return every 4 weeks for labs (next due 05/08/2021)  OTHER TESTS: None at this time  MEDICATIONS: You can STOP taking your B12 pill at home.  No changes to your home medications  FOLLOW-UP APPOINTMENT: Office visit in 3 months   Thank you for choosing [place] at [facility] to provide your oncology and hematology care.  To afford each patient quality time with our provider, please arrive at least 15 minutes before your scheduled appointment time.   If you have a lab appointment with the [facility] please come in thru the Main Entrance and  check in at the main information desk.  You need to re-schedule your appointment should you arrive 10 or more minutes late.  We strive to give you quality time with our providers, and arriving late affects you and other patients whose appointments are after yours.  Also, if you no show three or more times for appointments you may be dismissed from the clinic at the providers discretion.     Again, thank you for choosing [facility].  Our hope is that these requests will decrease the amount of time that you wait before being seen by our physicians.       _____________________________________________________________  Should you have questions after your visit to [facility], please contact our office at [phone] and follow the prompts.  Our office hours are 8:00 a.m. and 4:30 p.m. Monday - Friday.  Please note that voicemails left after 4:00 p.m. may not be returned until the following business day.  We are closed weekends and major holidays.  You do have access to a nurse 24-7, just call the main number to the clinic [phone] and do not press any options, hold on the line and a nurse will answer the phone.    For prescription refill requests, have your pharmacy contact our office and allow 72 hours.    Due to Covid, you will need to wear a mask upon entering the hospital. If you do not have a mask,  a mask will be given to you at the Main Entrance upon arrival. For doctor visits, patients may have 1 support person age 13 or older with them. For treatment visits, patients can not have anyone with them due to social distancing guidelines and our immunocompromised population.

## 2021-04-30 ENCOUNTER — Other Ambulatory Visit: Payer: Self-pay

## 2021-04-30 DIAGNOSIS — R188 Other ascites: Secondary | ICD-10-CM

## 2021-04-30 NOTE — Telephone Encounter (Signed)
Do you need new order instructions?

## 2021-04-30 NOTE — Telephone Encounter (Signed)
His previous para order has expired. Please advise if he can have new standing order.

## 2021-04-30 NOTE — Telephone Encounter (Signed)
3 para orders entered. Standing para order faxed to central scheduling.  Para scheduled for 05/06/21 at 10:00am, arrive at 9:45am.   Called and informed wife of new standing orders for para. Informed of upcoming para appt.

## 2021-04-30 NOTE — Telephone Encounter (Signed)
Standing orders for US paracentesis.  Max 6 L Labs: Gram stain, culture, and body fluid cell count

## 2021-04-30 NOTE — Telephone Encounter (Signed)
Yes, please give para instructions. Thanks.

## 2021-04-30 NOTE — Telephone Encounter (Signed)
Yes, ok to renew previous order

## 2021-05-01 ENCOUNTER — Ambulatory Visit (INDEPENDENT_AMBULATORY_CARE_PROVIDER_SITE_OTHER): Payer: Medicare Other

## 2021-05-01 ENCOUNTER — Other Ambulatory Visit: Payer: Self-pay

## 2021-05-01 DIAGNOSIS — R339 Retention of urine, unspecified: Secondary | ICD-10-CM | POA: Diagnosis not present

## 2021-05-01 NOTE — Patient Instructions (Signed)

## 2021-05-01 NOTE — Progress Notes (Signed)
Cath Change/ Replacement  Patient is present today for a catheter change due to urinary retention.  4ml of water was removed from the balloon, a 16FR foley cath was removed with out difficulty.  Patient was cleaned and prepped in a sterile fashion with betadine. A 16 FR foley cath was replaced into the bladder no complications were noted Urine return was noted 257ml and urine was yellow in color. The balloon was filled with 74ml of sterile water. A leg bag was attached for drainage. Patient was given proper instruction on catheter care.    Performed by: Rutilio Yellowhair LPN  Follow up: keep next scheduled NV

## 2021-05-06 ENCOUNTER — Ambulatory Visit (HOSPITAL_COMMUNITY)
Admission: RE | Admit: 2021-05-06 | Discharge: 2021-05-06 | Disposition: A | Discharge status: Home / Self Care | Payer: Medicare Other | Source: Ambulatory Visit | Attending: Gastroenterology | Admitting: Gastroenterology

## 2021-05-06 ENCOUNTER — Encounter (HOSPITAL_COMMUNITY): Payer: Self-pay

## 2021-05-06 ENCOUNTER — Other Ambulatory Visit: Payer: Self-pay

## 2021-05-06 DIAGNOSIS — R188 Other ascites: Secondary | ICD-10-CM | POA: Diagnosis present

## 2021-05-06 LAB — BODY FLUID CELL COUNT WITH DIFFERENTIAL
Eos, Fluid: 3 %
Lymphs, Fluid: 54 %
Monocyte-Macrophage-Serous Fluid: 34 % — ABNORMAL LOW (ref 50–90)
Neutrophil Count, Fluid: 9 % (ref 0–25)
Total Nucleated Cell Count, Fluid: 234 cu mm (ref 0–1000)

## 2021-05-06 NOTE — Progress Notes (Signed)
PT tolerated left sided paracentesis procedure well today and 2.5 Liters of cloudy light brown colored fluid removed and labs collected and sent for processing. Jeffrey [name] made aware of pt's complaint of general malaise and appearance of peritoneal fluid and labs ordered as STAT. PT verbalized understanding of post procedure instructions and vital signs remained stable. PT taken via wheelchair at this time to waiting room where wife was waiting on him with NAD noted.

## 2021-05-06 NOTE — Procedures (Signed)
   US guided LLQ paracentesis  2.5 L cloudy light brown fluid Sent for labs per MD  Tolerated well

## 2021-05-07 LAB — GRAM STAIN: Gram Stain: NONE SEEN

## 2021-05-08 ENCOUNTER — Inpatient Hospital Stay (HOSPITAL_COMMUNITY): Payer: Medicare Other | Attending: Hematology

## 2021-05-08 ENCOUNTER — Other Ambulatory Visit: Payer: Self-pay

## 2021-05-08 ENCOUNTER — Inpatient Hospital Stay (HOSPITAL_COMMUNITY): Payer: Medicare Other

## 2021-05-08 ENCOUNTER — Encounter (HOSPITAL_COMMUNITY): Payer: Self-pay

## 2021-05-08 VITALS — BP 153/70 | HR 80 | Temp 97.1°F | Resp 18 | Ht 66.0 in | Wt 201.0 lb

## 2021-05-08 DIAGNOSIS — D500 Iron deficiency anemia secondary to blood loss (chronic): Secondary | ICD-10-CM

## 2021-05-08 DIAGNOSIS — E538 Deficiency of other specified B group vitamins: Secondary | ICD-10-CM | POA: Insufficient documentation

## 2021-05-08 LAB — CBC WITH DIFFERENTIAL/PLATELET
Abs Immature Granulocytes: 0.01 10*3/uL (ref 0.00–0.07)
Basophils Absolute: 0.1 10*3/uL (ref 0.0–0.1)
Basophils Relative: 1 %
Eosinophils Absolute: 0.1 10*3/uL (ref 0.0–0.5)
Eosinophils Relative: 4 %
HCT: 29.2 % — ABNORMAL LOW (ref 39.0–52.0)
Hemoglobin: 9 g/dL — ABNORMAL LOW (ref 13.0–17.0)
Immature Granulocytes: 0 %
Lymphocytes Relative: 9 %
Lymphs Abs: 0.3 10*3/uL — ABNORMAL LOW (ref 0.7–4.0)
MCH: 33.6 pg (ref 26.0–34.0)
MCHC: 30.8 g/dL (ref 30.0–36.0)
MCV: 109 fL — ABNORMAL HIGH (ref 80.0–100.0)
Monocytes Absolute: 0.3 10*3/uL (ref 0.1–1.0)
Monocytes Relative: 8 %
Neutro Abs: 2.8 10*3/uL (ref 1.7–7.7)
Neutrophils Relative %: 78 %
Platelets: 80 10*3/uL — ABNORMAL LOW (ref 150–400)
RBC: 2.68 MIL/uL — ABNORMAL LOW (ref 4.22–5.81)
RDW: 17.5 % — ABNORMAL HIGH (ref 11.5–15.5)
WBC: 3.6 10*3/uL — ABNORMAL LOW (ref 4.0–10.5)
nRBC: 0 % (ref 0.0–0.2)

## 2021-05-08 LAB — SAMPLE TO BLOOD BANK

## 2021-05-08 MED ORDER — CYANOCOBALAMIN 1000 MCG/ML IJ SOLN
1000.0000 ug | Freq: Once | INTRAMUSCULAR | Status: AC
  Administered 2021-05-08: 1000 ug via INTRAMUSCULAR
  Filled 2021-05-08: qty 1

## 2021-05-08 NOTE — Patient Instructions (Signed)
[facility]  Discharge Instructions: Thank you for choosing [facility] to provide your oncology and hematology care.  If you have a lab appointment with the Cancer Center, please come in thru the Main Entrance and check in at the main information desk.  Wear comfortable clothing and clothing appropriate for easy access to any Portacath or PICC line.   We strive to give you quality time with your provider. You may need to reschedule your appointment if you arrive late (15 or more minutes).  Arriving late affects you and other patients whose appointments are after yours.  Also, if you miss three or more appointments without notifying the office, you may be dismissed from the clinic at the provider's discretion.      For prescription refill requests, have your pharmacy contact our office and allow 72 hours for refills to be completed.        To help prevent nausea and vomiting after your treatment, we encourage you to take your nausea medication as directed.  BELOW ARE SYMPTOMS THAT SHOULD BE REPORTED IMMEDIATELY: *FEVER GREATER THAN 100.4 F (38 C) OR HIGHER *CHILLS OR SWEATING *NAUSEA AND VOMITING THAT IS NOT CONTROLLED WITH YOUR NAUSEA MEDICATION *UNUSUAL SHORTNESS OF BREATH *UNUSUAL BRUISING OR BLEEDING *URINARY PROBLEMS (pain or burning when urinating, or frequent urination) *BOWEL PROBLEMS (unusual diarrhea, constipation, pain near the anus) TENDERNESS IN MOUTH AND THROAT WITH OR WITHOUT PRESENCE OF ULCERS (sore throat, sores in mouth, or a toothache) UNUSUAL RASH, SWELLING OR PAIN  UNUSUAL VAGINAL DISCHARGE OR ITCHING   Items with * indicate a potential emergency and should be followed up as soon as possible or go to the Emergency Department if any problems should occur.  Please show the CHEMOTHERAPY ALERT CARD or IMMUNOTHERAPY ALERT CARD at check-in to the Emergency Department and triage nurse.  Should you have questions after your visit or need to cancel  or reschedule your appointment, please contact [facility] [phone]  and follow the prompts.  Office hours are 8:00 a.m. to 4:30 p.m. Monday - Friday. Please note that voicemails left after 4:00 p.m. may not be returned until the following business day.  We are closed weekends and major holidays. You have access to a nurse at all times for urgent questions. Please call the main number to the clinic [phone] and follow the prompts.  For any non-urgent questions, you may also contact your provider using MyChart. We now offer e-Visits for anyone 18 and older to request care online for non-urgent symptoms. For details visit [email or web address].   Also download the MyChart app! Go to the app store, search "MyChart", open the app, select [place], and log in with your MyChart username and password.  Due to Covid, a mask is required upon entering the hospital/clinic. If you do not have a mask, one will be given to you upon arrival. For doctor visits, patients may have 1 support person aged 18 or older with them. For treatment visits, patients cannot have anyone with them due to current Covid guidelines and our immunocompromised population.  

## 2021-05-11 LAB — CULTURE, BODY FLUID W GRAM STAIN -BOTTLE: Culture: NO GROWTH

## 2021-05-13 ENCOUNTER — Encounter: Payer: Self-pay | Admitting: *Deleted

## 2021-05-13 ENCOUNTER — Ambulatory Visit (INDEPENDENT_AMBULATORY_CARE_PROVIDER_SITE_OTHER): Payer: Medicare Other | Admitting: Internal Medicine

## 2021-05-13 ENCOUNTER — Encounter: Payer: Self-pay | Admitting: Internal Medicine

## 2021-05-13 ENCOUNTER — Other Ambulatory Visit: Payer: Self-pay

## 2021-05-13 VITALS — BP 149/67 | HR 83 | Temp 97.3°F | Ht 66.0 in | Wt 199.2 lb

## 2021-05-13 DIAGNOSIS — K7031 Alcoholic cirrhosis of liver with ascites: Secondary | ICD-10-CM | POA: Diagnosis not present

## 2021-05-13 NOTE — Progress Notes (Signed)
Primary Care Physician:  Monico Blitz, MD Primary Gastroenterologist:  Dr. Gala Romney  Pre-Procedure History & Physical: HPI:  Jeffrey [name] is a 75 y.o. male here for   75 year old with end-stage ETOH cirrhosis recently treated inpatient for SBP based on positive gram stain.  Culture negative.  Recurrent GI bleeding related to colonic etiology based on colonoscopy in [place].  Piecemeal removal of multiple polyps.  Polyps remain; follow-up colonoscopy recommended but this is tempered by his overall poor health.  Prior grade 2 esophageal varices -  status post banding.  Somewhat overdue to have a follow-up exam.  He does have paper hematochezia from time to time -  known colorectal varices.  No melena no hematemesis; occasional nausea occasional twinges of abdominal pain; therapeutic paracentesis 1 week ago.  Analysis negative for SBP; continues on renal dose Levaquin as prophylaxis.  He continues on lactulose and Xifaxan.  Continues on Aldactone and furosemide.  Wife accompanies him today.  States his mental status has been very good.  Anasarca has decreased per wife's report.  He weighs 199 today; 219 in our office back in June of this year.  Patient and spouse do not want another colonoscopy.  Patient has an active consult with palliative care.  He is receiving in-home services.   Past Medical History:  Diagnosis Date   A-fib [facility])    when initially stating dialysis january 2021 at [facility] per spouse; never heard anything else about it   Alcoholic cirrhosis [facility])    patient reports completing hep a and b vaccines in 2006   Anemia    has had 3 units of prbcs 2013   Anxiety    Arthritis    Asymptomatic gallstones    Ultrasound in 2006   B12 deficiency 11/21/2020   C. difficile colitis    Cholelithiasis    Chronic kidney disease    Dialysis M/W/F/Sa   Depression    Diabetes ([place])    Duodenal ulcer with hemorrhage    per patient in 2006 or 2007, records have been  requested   Dyspnea    low oxygen level -  has O2 2 L all day   Esophageal varices ([place])    see [facility]   GERD (gastroesophageal reflux disease)    Heart burn    History of alcohol abuse    quit 05/2013   HOH (hard of hearing)    Hypertension    Liver cirrhosis ([place])    ETOH   SBP (spontaneous bacterial peritonitis) ([place])    2020, November 2021.    Splenomegaly    Ultrasound in 2006    Past Surgical History:  Procedure Laterality Date   AGILE CAPSULE N/A 09/20/2019   Procedure: AGILE CAPSULE;  Surgeon: Daneil Dolin, MD;  [facility];  Service: Endoscopy;  Laterality: N/A;   APPENDECTOMY     AV FISTULA PLACEMENT Left 08/17/2019   Procedure: ARTERIOVENOUS (AV) FISTULA CREATION VERSES ARTERIOVENOUS GRAFT;  Surgeon: [name], MD;  Location: [facility] OR;  Service: Vascular;  Laterality: Left;   BASCILIC VEIN TRANSPOSITION Left 09/28/2019   Procedure: LEFT SECOND STAGE [place];  Surgeon: [name], MD;  Location: [place];  Service: Vascular;  Laterality: Left;   BIOPSY  02/03/2018   Procedure: BIOPSY;  Surgeon: Daneil Dolin, MD;  [facility];  Service: Endoscopy;;  bx of gastric polyps   BIOPSY  09/19/2019   Procedure: BIOPSY;  Surgeon: Danie Binder, MD;  [facility];  Service: Endoscopy;;   BIOPSY  01/16/2021   Procedure: BIOPSY;  Surgeon: Daneil Dolin, MD;  [facility];  Service: Endoscopy;;   CATARACT EXTRACTION Right    COLONOSCOPY  03/10/2005   Rectal polyp as described above, removed with snare. Left sided  diverticula. The remainder of the colonic mucosa appeared normal. Inflammed polyp on path.   COLONOSCOPY  04/1999   Dr. Thea Silversmith polyps removed,  Path showed hyperplastic   COLONOSCOPY  10/2015   Dr. Britta Mccreedy: diverticulosis, single sessile tubular adenoma 3-71mm in size removed from descending colon.    COLONOSCOPY WITH ESOPHAGOGASTRODUODENOSCOPY (EGD)  03/31/2012   WNU:UVOZDGU AVMs. Colonic  diverticulosis. tubular adenoma colon   COLONOSCOPY WITH ESOPHAGOGASTRODUODENOSCOPY (EGD)  06/2019   FORSYTH: small esophageal varices without high risk stigmata, single large AVM on lesser curvature in gastric body s/p APC ablation, gastric antral and duodenal bulb polyposis. No significant source to explain transfusion dependent anemia. Colonoscopy with portal colopathy and diffuse edema, changes of Cdiff colitis on colonoscopy   COLONOSCOPY WITH PROPOFOL N/A 01/19/2021   Surgeon: [name], MD; 15 mm polyp in descending colon removed piecemeal, 6 mm polyp in ascending colon and 12 mm polyp in sigmoid colon not removed due to high bleeding risk, benign polypoid lesion at splenic flexure, diverticulosis in sigmoid and descending colon, rectal varices.   COLONOSCOPY WITH PROPOFOL N/A 01/16/2021   Surgeon: Daneil Dolin, MD;  Portal colopathy, large rectal varices, polypoid mass at splenic flexure biopsy, two 1 cm left colon polyps not removed, left-sided diverticulosis.  Reported patient had bleeding from splenic flexure lesion.  Pathology with acutely inflamed colonic mucosa with localized ulceration and prominent reactive changes.   ESOPHAGEAL BANDING N/A 02/03/2018   Procedure: ESOPHAGEAL BANDING;  Surgeon: Daneil Dolin, MD;  [facility];  Service: Endoscopy;  Laterality: N/A;   ESOPHAGEAL BANDING  01/16/2021   Procedure: ESOPHAGEAL BANDING;  Surgeon: Daneil Dolin, MD;  [facility];  Service: Endoscopy;;   ESOPHAGOGASTRODUODENOSCOPY  01/15/2005   Three columns grade 1 to 2 esophageal varices, otherwise normal esophageal mucosa.  Esophagus was not manipulated otherwise./Nodularity of the antrum with overlying erosions, nonspecific finding. Path showed rare H.pylori   ESOPHAGOGASTRODUODENOSCOPY  09/2008   Dr. Gaylene Brooks columns of grade 2-3 esoph varices, only one column was prominent. Portal gastropathy, multiple gastrc polyps at antrum, two were 2cm with  black eschar, bulbar polyps, bulbar erosions   ESOPHAGOGASTRODUODENOSCOPY  11/2004   Dr. Brantley Stage 3 esoph varices   ESOPHAGOGASTRODUODENOSCOPY  03/31/2012   RMR: 4 columns(3-GR 2, 1-GR1) non-bleeding esophageal varices, portal gastropathy, small HH, early GAVE, multiple gastric polyps    ESOPHAGOGASTRODUODENOSCOPY (EGD) WITH PROPOFOL N/A 02/03/2018   Dr. Gala Romney: Esophageal varices, 3 columns grade 1-2.  Portal hypertensive gastropathy.  Multiple gastric polyps, biopsy consistent with hyperplastic.   ESOPHAGOGASTRODUODENOSCOPY (EGD) WITH PROPOFOL N/A 09/19/2019   Fields: grade I and II esophageal varcies, mild portal hypertensive gastropathy, moderate gastritis but no H. pylori, single hyperplastic gastric polyp removed, obvious source for melena/transfusion dependent anemia not identified, may be due to friable gastric and duodenal mucosa in the setting of portal hypertension   ESOPHAGOGASTRODUODENOSCOPY (EGD) WITH PROPOFOL N/A 01/16/2021   Surgeon: Daneil Dolin, MD; Portal hypertensive gastropathy, grade 2 esophageal varices with bleeding stigmata s/p banding x4, slight nodularity and congestion involving bulb and second portion of duodenum, nonspecific.  Recommended repeat EGD in 4 weeks.   GASTRIC VARICES BANDING  03/31/2012  Procedure: GASTRIC VARICES BANDING;  Surgeon: Daneil Dolin, MD;  [facility];  Service: Endoscopy;  Laterality: N/A;   GIVENS CAPSULE STUDY N/A 09/21/2019   Poor study with a lot of debris obstructing much of the view of the first approximate 3 hours out of 6 hours.  No obvious source of bleeding identified.  Sequela of bleeding and old blood seen in the form of"whisps" of blood, blood flecks mostly toward the end of the study.   HEMOSTASIS CLIP PLACEMENT  01/19/2021   Procedure: HEMOSTASIS CLIP PLACEMENT;  Surgeon: [name], MD;  Location[facility] ENDOSCOPY;  Service: Gastroenterology;;   IR REMOVAL TUN CV CATH W/O FL  02/15/2020    POLYPECTOMY  09/19/2019   Procedure: POLYPECTOMY;  Surgeon: Danie Binder, MD;  [facility];  Service: Endoscopy;;   POLYPECTOMY  01/19/2021   Procedure: POLYPECTOMY;  Surgeon: [name], MD;  Location: [facility] ENDOSCOPY;  Service: Gastroenterology;;   UMBILICAL HERNIA REPAIR  2017   exploratory laparotomy, abdominal washout    Prior to Admission medications   Medication Sig Start Date End Date Taking? Authorizing Provider  acetaminophen (TYLENOL) 650 MG CR tablet Take 650 mg by mouth every 8 (eight) hours as needed for pain.   Yes [provider]  Alcohol Swabs (ALCOHOL PREP) 70 % PADS Apply 1 each topically 3 (three) times daily. 01/31/20  Yes [provider]  ALPRAZolam Duanne Moron) 0.5 MG tablet Take 0.5 mg by mouth See admin instructions. Take one tablet (0.5 mg) by mouth on Monday, Wednesday, Friday before dialysis, may also take one tablet (0.5 mg) daily as needed for anxiety. 09/21/20  Yes [provider]  Ascorbic Acid (VITAMIN C) 1000 MG tablet Take 1,000 mg by mouth in the morning.   Yes [provider]  BD PEN NEEDLE MICRO U/F 32G X 6 MM MISC SMARTSIG:SUB-Q Daily PRN 04/23/21  Yes [provider]  calcium carbonate (TUMS - DOSED IN MG ELEMENTAL CALCIUM) 500 MG chewable tablet Chew 1 tablet by mouth 3 (three) times daily with meals.   Yes [provider]  Calcium Carbonate-Simethicone (ALKA-SELTZER HEARTBURN + GAS) 750-80 MG CHEW Chew 1-2 tablets by mouth 2 (two) times daily as needed (acid reflux).   Yes [provider]  diltiazem (TIAZAC) 120 MG 24 hr capsule Take 120 mg by mouth daily. 05/28/20  Yes [provider]  furosemide (LASIX) 40 MG tablet Take 80 mg by mouth daily. 12/29/19  Yes [provider]  gabapentin (NEURONTIN) 100 MG capsule TAKE 1 CAPSULE BY MOUTH AT BEDTIME Patient taking differently: Take 100 mg by mouth daily with supper. 05/28/20  Yes Waynetta Sandy, MD   GLUCAGEN HYPOKIT 1 MG SOLR injection Inject 1 mg into the muscle once as needed for low blood sugar. 11/28/20  Yes [provider]  HUMALOG KWIKPEN 100 UNIT/ML KwikPen SMARTSIG:50 Unit(s) SUB-Q Twice Daily 04/23/21  Yes [provider]  hydrocortisone (ANUSOL-HC) 2.5 % rectal cream Place 1 application rectally 2 (two) times daily as needed for hemorrhoids or anal itching. 04/15/21  Yes Johnson, Clanford L, MD  hydrOXYzine (ATARAX/VISTARIL) 25 MG tablet Take 25 mg by mouth 3 (three) times daily. For itching 11/23/20  Yes [provider]  iron polysaccharides (NIFEREX) 150 MG capsule Take 150 mg by mouth daily with supper.   Yes [provider]  lactulose (CONSTULOSE) 10 GM/15ML solution TAKE 45 MLS EVERY MORNING AND TAKE 42 MLS EVERY EVENING Patient taking differently: Take 30 g  by mouth See admin instructions. Take 45 mls (30 g) by mouth on Sunday, Tuesday, Thursday, Saturday mornings (non-dialysis days) unless going to MD appointment; take 45 mls (30 g) daily at bedtime 07/16/20  Yes Mahala Menghini, PA-C  levofloxacin (LEVAQUIN) 500 MG tablet TAKE 1 TABLET BY MOUTH THREE TIMES EVERY WEEK AFTER DIALYSIS 03/14/21  Yes [name] S, PA-C  metoprolol succinate (TOPROL-XL) 25 MG 24 hr tablet Take 1 tablet (25 mg total) by mouth See admin instructions. Take one tablet (25 mg) by mouth at bedtime every night and take one tablet (25 mg) in morning on  Sun, Tues, Thurs and Sat (non-dialysis days) 04/15/21  Yes Johnson, Clanford L, MD  midodrine (PROAMATINE) 10 MG tablet Take 1 tablet (10 mg total) by mouth 2 (two) times daily with a meal. 04/06/20  Yes Tat, Shanon Brow, MD  milk thistle 175 MG tablet Take 175 mg by mouth 3 (three) times daily.    Yes [provider]  mirtazapine (REMERON) 30 MG tablet Take 1 tablet (30 mg total) by mouth at bedtime. Patient taking differently: Take 30 mg by mouth daily with supper. 02/10/20  Yes Roxan Hockey, MD  Multiple Vitamin  (MULTIVITAMIN WITH MINERALS) TABS tablet Take 1 tablet by mouth in the morning.    Yes [provider]  omega-3 acid ethyl esters (LOVAZA) 1 g capsule Take 2 g by mouth 2 (two) times daily.  04/07/19  Yes [provider]  ondansetron (ZOFRAN) 4 MG tablet Take 4 mg by mouth every 8 (eight) hours as needed for nausea or vomiting.   Yes [provider]  oxyCODONE-acetaminophen (PERCOCET/ROXICET) 5-325 MG tablet Take 1 tablet by mouth every 4 (four) hours as needed for moderate pain. 02/27/20  Yes [provider]  pantoprazole (PROTONIX) 40 MG tablet Take 2 tablets (80 mg total) by mouth daily. 01/21/21  Yes Shelly Coss, MD  Ped Vitamins ACD Fl-Iron (TRI-VIT/FLUORIDE/IRON PO) Take 1 tablet by mouth daily. 08/01/19  Yes [provider]  pravastatin (PRAVACHOL) 20 MG tablet Take 20 mg by mouth in the morning.    Yes [provider]  Probiotic Product (PROBIOTIC DAILY PO) Take 1 tablet by mouth in the morning.    Yes [provider]  spironolactone (ALDACTONE) 50 MG tablet Take 50 mg by mouth in the morning.    Yes [provider]  thiamine (VITAMIN B-1) 100 MG tablet Take 100 mg by mouth daily. 08/27/20  Yes [provider]  tiZANidine (ZANAFLEX) 2 MG tablet Take 2 mg by mouth 2 (two) times daily as needed for muscle spasms. 02/29/20  Yes [provider]  TRESIBA FLEXTOUCH 200 UNIT/ML FlexTouch Pen SMARTSIG:120 Unit(s) SUB-Q Daily 04/22/21  Yes [provider]  triamcinolone cream (KENALOG) 0.1 % Apply 1 application topically 2 (two) times daily as needed (dry skin Itching). 12/30/17  Yes [provider]  venlafaxine (EFFEXOR) 75 MG tablet Take 75 mg by mouth 3 (three) times daily with meals.    Yes [provider]  XIFAXAN 550 MG TABS tablet TAKE 1 TABLET BY MOUTH TWICE DAILY 02/13/21  Yes Mahala Menghini, PA-C    Allergies as of 05/13/2021 - Review Complete 05/13/2021  Allergen Reaction Noted    Tape Itching, Rash, and Other (See Comments) 09/18/2019    Family History  Problem Relation Age of Onset   Colon cancer Father 71       deceased age 46   Breast cancer Sister  deceased   Diabetes Sister    Stroke Mother    Healthy Son    Healthy Daughter    Lung cancer Neg Hx    Ovarian cancer Neg Hx     Social History   Socioeconomic History   Marital status: Married    Spouse name: Not on file   Number of children: 2   Years of education: Not on file   Highest education level: Not on file  Occupational History   Occupation: RETIRED    Employer: SELF EMPLOYED  Tobacco Use   Smoking status: Former    Packs/day: 2.00    Years: 25.00    Pack years: 50.00    Types: Cigarettes    Quit date: 01/28/1986    Years since quitting: 35.3   Smokeless tobacco: Never  Vaping Use   Vaping Use: Never used  Substance and Sexual Activity   Alcohol use: No    Comment: quit in 05/2013   Drug use: No   Sexual activity: Not Currently  Other Topics Concern   Not on file  Social History Narrative   Two step children from second marriage (divorced) who live with him along with some grandchildren.    Social Determinants of Radio broadcast assistant Strain: Not on file  Food Insecurity: No Food Insecurity   Worried About Charity fundraiser in the Last Year: Never true   Ran Out of Food in the Last Year: Never true  Transportation Needs: Not on file  Physical Activity: Inactive   Days of Exercise per Week: 0 days   Minutes of Exercise per Session: 0 min  Stress: Not on file  Social Connections: Not on file  Intimate Partner Violence: Not At Risk   Fear of Current or Ex-Partner: No   Emotionally Abused: No   Physically Abused: No   Sexually Abused: No    Review of Systems: See HPI, otherwise negative ROS  Physical Exam: BP (!) 149/67    Pulse 83    Temp (!) 97.3 F (36.3 C) (Temporal)    Ht 5\' 6"  (1.676 m)    Wt 199 lb 3.2 oz (90.4 kg)    BMI 32.15 kg/m   General:    disheveled, chronically ill-appearing gentleman in a wheelchair.  Alert and conversant; pleasant and cooperative in NAD.    He does have asterixis but is well oriented and conversant. Eyes:  Sclera clear, no icterus.   Conjunctiva pink. Neck:  Supple; no masses or thyromegaly. No significant cervical adenopathy. Lungs:  Clear throughout to auscultation.   No wheezes, crackles, or rhonchi. No acute distress. Heart:  Regular rate and rhythm; no murmurs, clicks, rubs,  or gallops. Abdomen: Non-distended, normal bowel sounds.  Soft and nontender without appreciable mass or hepatosplenomegaly.  Pulses:  Normal pulses noted. Extremities:  Without clubbing or edema.  Impression/Plan:   75 year old gentleman with end-stage EtOH related cirrhosis complicated by portal hypertension, variceal hemorrhage, recurrent SBP and hepatic encephalopathy.  Significant comorbidities include chronic kidney disease on hemodialysis,  atrial fibrillation and diabetes.  Recently hospitalized for presumed SBP based on gram stain but not white count or culture.  Clinically,  he is marginally compensated at this time.    Slow GI bleeding from multiple sources, colonic polyps, portal colopathy, portal gastropathy  - likely will continue.  Waxing and waning hepatic encephalopathy.  Remains on lactulose and Xifaxan.    Stool frequency at goal.   Recommendations:  Complete abdominal ultrasound [name] in  the near future.  Screen for hepatoma, look at liver and spleen.  History of cirrhosis.  Schedule a surveillance EGD (history of esophageal varices with variceal hemorrhage - status post esophageal band ligation previously)   With the specific goal of preventing a future torrential variceal hemorrhage only. ASA 4/propofol.  The risks, benefits, limitations, alternatives and imponderables have been reviewed with the patient. Potential for esophageal dilation, biopsy, etc. have also been reviewed.  Questions  have been answered. All parties agreeable. Lets try do before the end of the year. (Lets plan for early morning procedure slot on the schedule)  Continue lactulose  -  titrate to 3 soft BMs daily  Continue Xifaxan.  Continue Protonix 40 mg daily  Continue levofloxacin  Continue Aldactone and Lasix/furosemide  Continue with a 2 g sodium diet.  As discussed, polyps remain in  the colon.  The risks of the procedure likely outweigh the benefits as discussed.  At this time, no plans a repeat colonoscopy.   moreover, patient and spouse are not desirous of a repeat colonoscopy.  Continue with in-home palliative care/home health.  Overall short-term prognosis is poor.   Notice: This dictation was prepared with Dragon dictation along with smaller phrase technology. Any transcriptional errors that result from this process are unintentional and may not be corrected upon review.

## 2021-05-13 NOTE — H&P (View-Only) (Signed)
Primary Care Physician:  Monico Blitz, MD Primary Gastroenterologist:  Dr. Gala Romney  Pre-Procedure History & Physical: HPI:  Jeffrey [name] is a 75 y.o. male here for   75 year old with end-stage ETOH cirrhosis recently treated inpatient for SBP based on positive gram stain.  Culture negative.  Recurrent GI bleeding related to colonic etiology based on colonoscopy in [place].  Piecemeal removal of multiple polyps.  Polyps remain; follow-up colonoscopy recommended but this is tempered by his overall poor health.  Prior grade 2 esophageal varices -  status post banding.  Somewhat overdue to have a follow-up exam.  He does have paper hematochezia from time to time -  known colorectal varices.  No melena no hematemesis; occasional nausea occasional twinges of abdominal pain; therapeutic paracentesis 1 week ago.  Analysis negative for SBP; continues on renal dose Levaquin as prophylaxis.  He continues on lactulose and Xifaxan.  Continues on Aldactone and furosemide.  Wife accompanies him today.  States his mental status has been very good.  Anasarca has decreased per wife's report.  He weighs 199 today; 219 in our office back in June of this year.  Patient and spouse do not want another colonoscopy.  Patient has an active consult with palliative care.  He is receiving in-home services.   Past Medical History:  Diagnosis Date   A-fib [facility])    when initially stating dialysis january 2021 at [facility] per spouse; never heard anything else about it   Alcoholic cirrhosis [facility])    patient reports completing hep a and b vaccines in 2006   Anemia    has had 3 units of prbcs 2013   Anxiety    Arthritis    Asymptomatic gallstones    Ultrasound in 2006   B12 deficiency 11/21/2020   C. difficile colitis    Cholelithiasis    Chronic kidney disease    Dialysis M/W/F/Sa   Depression    Diabetes ([place])    Duodenal ulcer with hemorrhage    per patient in 2006 or 2007, records have been  requested   Dyspnea    low oxygen level -  has O2 2 L all day   Esophageal varices ([place])    see [facility]   GERD (gastroesophageal reflux disease)    Heart burn    History of alcohol abuse    quit 05/2013   HOH (hard of hearing)    Hypertension    Liver cirrhosis ([place])    ETOH   SBP (spontaneous bacterial peritonitis) ([place])    2020, November 2021.    Splenomegaly    Ultrasound in 2006    Past Surgical History:  Procedure Laterality Date   AGILE CAPSULE N/A 09/20/2019   Procedure: AGILE CAPSULE;  Surgeon: Daneil Dolin, MD;  [facility];  Service: Endoscopy;  Laterality: N/A;   APPENDECTOMY     AV FISTULA PLACEMENT Left 08/17/2019   Procedure: ARTERIOVENOUS (AV) FISTULA CREATION VERSES ARTERIOVENOUS GRAFT;  Surgeon: [name], MD;  Location: [facility] OR;  Service: Vascular;  Laterality: Left;   BASCILIC VEIN TRANSPOSITION Left 09/28/2019   Procedure: LEFT SECOND STAGE [place];  Surgeon: [name], MD;  Location: [place];  Service: Vascular;  Laterality: Left;   BIOPSY  02/03/2018   Procedure: BIOPSY;  Surgeon: Daneil Dolin, MD;  [facility];  Service: Endoscopy;;  bx of gastric polyps   BIOPSY  09/19/2019   Procedure: BIOPSY;  Surgeon: Danie Binder, MD;  [facility];  Service: Endoscopy;;   BIOPSY  01/16/2021   Procedure: BIOPSY;  Surgeon: Daneil Dolin, MD;  [facility];  Service: Endoscopy;;   CATARACT EXTRACTION Right    COLONOSCOPY  03/10/2005   Rectal polyp as described above, removed with snare. Left sided  diverticula. The remainder of the colonic mucosa appeared normal. Inflammed polyp on path.   COLONOSCOPY  04/1999   Dr. Thea Silversmith polyps removed,  Path showed hyperplastic   COLONOSCOPY  10/2015   Dr. Britta Mccreedy: diverticulosis, single sessile tubular adenoma 3-60mm in size removed from descending colon.    COLONOSCOPY WITH ESOPHAGOGASTRODUODENOSCOPY (EGD)  03/31/2012   DZH:GDJMEQA AVMs. Colonic  diverticulosis. tubular adenoma colon   COLONOSCOPY WITH ESOPHAGOGASTRODUODENOSCOPY (EGD)  06/2019   FORSYTH: small esophageal varices without high risk stigmata, single large AVM on lesser curvature in gastric body s/p APC ablation, gastric antral and duodenal bulb polyposis. No significant source to explain transfusion dependent anemia. Colonoscopy with portal colopathy and diffuse edema, changes of Cdiff colitis on colonoscopy   COLONOSCOPY WITH PROPOFOL N/A 01/19/2021   Surgeon: [name], MD; 15 mm polyp in descending colon removed piecemeal, 6 mm polyp in ascending colon and 12 mm polyp in sigmoid colon not removed due to high bleeding risk, benign polypoid lesion at splenic flexure, diverticulosis in sigmoid and descending colon, rectal varices.   COLONOSCOPY WITH PROPOFOL N/A 01/16/2021   Surgeon: Daneil Dolin, MD;  Portal colopathy, large rectal varices, polypoid mass at splenic flexure biopsy, two 1 cm left colon polyps not removed, left-sided diverticulosis.  Reported patient had bleeding from splenic flexure lesion.  Pathology with acutely inflamed colonic mucosa with localized ulceration and prominent reactive changes.   ESOPHAGEAL BANDING N/A 02/03/2018   Procedure: ESOPHAGEAL BANDING;  Surgeon: Daneil Dolin, MD;  [facility];  Service: Endoscopy;  Laterality: N/A;   ESOPHAGEAL BANDING  01/16/2021   Procedure: ESOPHAGEAL BANDING;  Surgeon: Daneil Dolin, MD;  [facility];  Service: Endoscopy;;   ESOPHAGOGASTRODUODENOSCOPY  01/15/2005   Three columns grade 1 to 2 esophageal varices, otherwise normal esophageal mucosa.  Esophagus was not manipulated otherwise./Nodularity of the antrum with overlying erosions, nonspecific finding. Path showed rare H.pylori   ESOPHAGOGASTRODUODENOSCOPY  09/2008   Dr. Gaylene Brooks columns of grade 2-3 esoph varices, only one column was prominent. Portal gastropathy, multiple gastrc polyps at antrum, two were 2cm with  black eschar, bulbar polyps, bulbar erosions   ESOPHAGOGASTRODUODENOSCOPY  11/2004   Dr. Brantley Stage 3 esoph varices   ESOPHAGOGASTRODUODENOSCOPY  03/31/2012   RMR: 4 columns(3-GR 2, 1-GR1) non-bleeding esophageal varices, portal gastropathy, small HH, early GAVE, multiple gastric polyps    ESOPHAGOGASTRODUODENOSCOPY (EGD) WITH PROPOFOL N/A 02/03/2018   Dr. Gala Romney: Esophageal varices, 3 columns grade 1-2.  Portal hypertensive gastropathy.  Multiple gastric polyps, biopsy consistent with hyperplastic.   ESOPHAGOGASTRODUODENOSCOPY (EGD) WITH PROPOFOL N/A 09/19/2019   Fields: grade I and II esophageal varcies, mild portal hypertensive gastropathy, moderate gastritis but no H. pylori, single hyperplastic gastric polyp removed, obvious source for melena/transfusion dependent anemia not identified, may be due to friable gastric and duodenal mucosa in the setting of portal hypertension   ESOPHAGOGASTRODUODENOSCOPY (EGD) WITH PROPOFOL N/A 01/16/2021   Surgeon: Daneil Dolin, MD; Portal hypertensive gastropathy, grade 2 esophageal varices with bleeding stigmata s/p banding x4, slight nodularity and congestion involving bulb and second portion of duodenum, nonspecific.  Recommended repeat EGD in 4 weeks.   GASTRIC VARICES BANDING  03/31/2012  Procedure: GASTRIC VARICES BANDING;  Surgeon: Daneil Dolin, MD;  [facility];  Service: Endoscopy;  Laterality: N/A;   GIVENS CAPSULE STUDY N/A 09/21/2019   Poor study with a lot of debris obstructing much of the view of the first approximate 3 hours out of 6 hours.  No obvious source of bleeding identified.  Sequela of bleeding and old blood seen in the form of"whisps" of blood, blood flecks mostly toward the end of the study.   HEMOSTASIS CLIP PLACEMENT  01/19/2021   Procedure: HEMOSTASIS CLIP PLACEMENT;  Surgeon: [name], MD;  Location[facility] ENDOSCOPY;  Service: Gastroenterology;;   IR REMOVAL TUN CV CATH W/O FL  02/15/2020    POLYPECTOMY  09/19/2019   Procedure: POLYPECTOMY;  Surgeon: Danie Binder, MD;  [facility];  Service: Endoscopy;;   POLYPECTOMY  01/19/2021   Procedure: POLYPECTOMY;  Surgeon: [name], MD;  Location: [facility] ENDOSCOPY;  Service: Gastroenterology;;   UMBILICAL HERNIA REPAIR  2017   exploratory laparotomy, abdominal washout    Prior to Admission medications   Medication Sig Start Date End Date Taking? Authorizing Provider  acetaminophen (TYLENOL) 650 MG CR tablet Take 650 mg by mouth every 8 (eight) hours as needed for pain.   Yes [provider]  Alcohol Swabs (ALCOHOL PREP) 70 % PADS Apply 1 each topically 3 (three) times daily. 01/31/20  Yes [provider]  ALPRAZolam Duanne Moron) 0.5 MG tablet Take 0.5 mg by mouth See admin instructions. Take one tablet (0.5 mg) by mouth on Monday, Wednesday, Friday before dialysis, may also take one tablet (0.5 mg) daily as needed for anxiety. 09/21/20  Yes [provider]  Ascorbic Acid (VITAMIN C) 1000 MG tablet Take 1,000 mg by mouth in the morning.   Yes [provider]  BD PEN NEEDLE MICRO U/F 32G X 6 MM MISC SMARTSIG:SUB-Q Daily PRN 04/23/21  Yes [provider]  calcium carbonate (TUMS - DOSED IN MG ELEMENTAL CALCIUM) 500 MG chewable tablet Chew 1 tablet by mouth 3 (three) times daily with meals.   Yes [provider]  Calcium Carbonate-Simethicone (ALKA-SELTZER HEARTBURN + GAS) 750-80 MG CHEW Chew 1-2 tablets by mouth 2 (two) times daily as needed (acid reflux).   Yes [provider]  diltiazem (TIAZAC) 120 MG 24 hr capsule Take 120 mg by mouth daily. 05/28/20  Yes [provider]  furosemide (LASIX) 40 MG tablet Take 80 mg by mouth daily. 12/29/19  Yes [provider]  gabapentin (NEURONTIN) 100 MG capsule TAKE 1 CAPSULE BY MOUTH AT BEDTIME Patient taking differently: Take 100 mg by mouth daily with supper. 05/28/20  Yes Waynetta Sandy, MD   GLUCAGEN HYPOKIT 1 MG SOLR injection Inject 1 mg into the muscle once as needed for low blood sugar. 11/28/20  Yes [provider]  HUMALOG KWIKPEN 100 UNIT/ML KwikPen SMARTSIG:50 Unit(s) SUB-Q Twice Daily 04/23/21  Yes [provider]  hydrocortisone (ANUSOL-HC) 2.5 % rectal cream Place 1 application rectally 2 (two) times daily as needed for hemorrhoids or anal itching. 04/15/21  Yes Johnson, Clanford L, MD  hydrOXYzine (ATARAX/VISTARIL) 25 MG tablet Take 25 mg by mouth 3 (three) times daily. For itching 11/23/20  Yes [provider]  iron polysaccharides (NIFEREX) 150 MG capsule Take 150 mg by mouth daily with supper.   Yes [provider]  lactulose (CONSTULOSE) 10 GM/15ML solution TAKE 45 MLS EVERY MORNING AND TAKE 3 MLS EVERY EVENING Patient taking differently: Take 30 g  by mouth See admin instructions. Take 45 mls (30 g) by mouth on Sunday, Tuesday, Thursday, Saturday mornings (non-dialysis days) unless going to MD appointment; take 45 mls (30 g) daily at bedtime 07/16/20  Yes Mahala Menghini, PA-C  levofloxacin (LEVAQUIN) 500 MG tablet TAKE 1 TABLET BY MOUTH THREE TIMES EVERY WEEK AFTER DIALYSIS 03/14/21  Yes [name] S, PA-C  metoprolol succinate (TOPROL-XL) 25 MG 24 hr tablet Take 1 tablet (25 mg total) by mouth See admin instructions. Take one tablet (25 mg) by mouth at bedtime every night and take one tablet (25 mg) in morning on  Sun, Tues, Thurs and Sat (non-dialysis days) 04/15/21  Yes Johnson, Clanford L, MD  midodrine (PROAMATINE) 10 MG tablet Take 1 tablet (10 mg total) by mouth 2 (two) times daily with a meal. 04/06/20  Yes Tat, Shanon Brow, MD  milk thistle 175 MG tablet Take 175 mg by mouth 3 (three) times daily.    Yes [provider]  mirtazapine (REMERON) 30 MG tablet Take 1 tablet (30 mg total) by mouth at bedtime. Patient taking differently: Take 30 mg by mouth daily with supper. 02/10/20  Yes Roxan Hockey, MD  Multiple Vitamin  (MULTIVITAMIN WITH MINERALS) TABS tablet Take 1 tablet by mouth in the morning.    Yes [provider]  omega-3 acid ethyl esters (LOVAZA) 1 g capsule Take 2 g by mouth 2 (two) times daily.  04/07/19  Yes [provider]  ondansetron (ZOFRAN) 4 MG tablet Take 4 mg by mouth every 8 (eight) hours as needed for nausea or vomiting.   Yes [provider]  oxyCODONE-acetaminophen (PERCOCET/ROXICET) 5-325 MG tablet Take 1 tablet by mouth every 4 (four) hours as needed for moderate pain. 02/27/20  Yes [provider]  pantoprazole (PROTONIX) 40 MG tablet Take 2 tablets (80 mg total) by mouth daily. 01/21/21  Yes Shelly Coss, MD  Ped Vitamins ACD Fl-Iron (TRI-VIT/FLUORIDE/IRON PO) Take 1 tablet by mouth daily. 08/01/19  Yes [provider]  pravastatin (PRAVACHOL) 20 MG tablet Take 20 mg by mouth in the morning.    Yes [provider]  Probiotic Product (PROBIOTIC DAILY PO) Take 1 tablet by mouth in the morning.    Yes [provider]  spironolactone (ALDACTONE) 50 MG tablet Take 50 mg by mouth in the morning.    Yes [provider]  thiamine (VITAMIN B-1) 100 MG tablet Take 100 mg by mouth daily. 08/27/20  Yes [provider]  tiZANidine (ZANAFLEX) 2 MG tablet Take 2 mg by mouth 2 (two) times daily as needed for muscle spasms. 02/29/20  Yes [provider]  TRESIBA FLEXTOUCH 200 UNIT/ML FlexTouch Pen SMARTSIG:120 Unit(s) SUB-Q Daily 04/22/21  Yes [provider]  triamcinolone cream (KENALOG) 0.1 % Apply 1 application topically 2 (two) times daily as needed (dry skin Itching). 12/30/17  Yes [provider]  venlafaxine (EFFEXOR) 75 MG tablet Take 75 mg by mouth 3 (three) times daily with meals.    Yes [provider]  XIFAXAN 550 MG TABS tablet TAKE 1 TABLET BY MOUTH TWICE DAILY 02/13/21  Yes Mahala Menghini, PA-C    Allergies as of 05/13/2021 - Review Complete 05/13/2021  Allergen Reaction Noted    Tape Itching, Rash, and Other (See Comments) 09/18/2019    Family History  Problem Relation Age of Onset   Colon cancer Father 42       deceased age 13   Breast cancer Sister  deceased   Diabetes Sister    Stroke Mother    Healthy Son    Healthy Daughter    Lung cancer Neg Hx    Ovarian cancer Neg Hx     Social History   Socioeconomic History   Marital status: Married    Spouse name: Not on file   Number of children: 2   Years of education: Not on file   Highest education level: Not on file  Occupational History   Occupation: RETIRED    Employer: SELF EMPLOYED  Tobacco Use   Smoking status: Former    Packs/day: 2.00    Years: 25.00    Pack years: 50.00    Types: Cigarettes    Quit date: 01/28/1986    Years since quitting: 35.3   Smokeless tobacco: Never  Vaping Use   Vaping Use: Never used  Substance and Sexual Activity   Alcohol use: No    Comment: quit in 05/2013   Drug use: No   Sexual activity: Not Currently  Other Topics Concern   Not on file  Social History Narrative   Two step children from second marriage (divorced) who live with him along with some grandchildren.    Social Determinants of Radio broadcast assistant Strain: Not on file  Food Insecurity: No Food Insecurity   Worried About Charity fundraiser in the Last Year: Never true   Ran Out of Food in the Last Year: Never true  Transportation Needs: Not on file  Physical Activity: Inactive   Days of Exercise per Week: 0 days   Minutes of Exercise per Session: 0 min  Stress: Not on file  Social Connections: Not on file  Intimate Partner Violence: Not At Risk   Fear of Current or Ex-Partner: No   Emotionally Abused: No   Physically Abused: No   Sexually Abused: No    Review of Systems: See HPI, otherwise negative ROS  Physical Exam: BP (!) 149/67    Pulse 83    Temp (!) 97.3 F (36.3 C) (Temporal)    Ht 5\' 6"  (1.676 m)    Wt 199 lb 3.2 oz (90.4 kg)    BMI 32.15 kg/m   General:    disheveled, chronically ill-appearing gentleman in a wheelchair.  Alert and conversant; pleasant and cooperative in NAD.    He does have asterixis but is well oriented and conversant. Eyes:  Sclera clear, no icterus.   Conjunctiva pink. Neck:  Supple; no masses or thyromegaly. No significant cervical adenopathy. Lungs:  Clear throughout to auscultation.   No wheezes, crackles, or rhonchi. No acute distress. Heart:  Regular rate and rhythm; no murmurs, clicks, rubs,  or gallops. Abdomen: Non-distended, normal bowel sounds.  Soft and nontender without appreciable mass or hepatosplenomegaly.  Pulses:  Normal pulses noted. Extremities:  Without clubbing or edema.  Impression/Plan:   75 year old gentleman with end-stage EtOH related cirrhosis complicated by portal hypertension, variceal hemorrhage, recurrent SBP and hepatic encephalopathy.  Significant comorbidities include chronic kidney disease on hemodialysis,  atrial fibrillation and diabetes.  Recently hospitalized for presumed SBP based on gram stain but not white count or culture.  Clinically,  he is marginally compensated at this time.    Slow GI bleeding from multiple sources, colonic polyps, portal colopathy, portal gastropathy  - likely will continue.  Waxing and waning hepatic encephalopathy.  Remains on lactulose and Xifaxan.    Stool frequency at goal.   Recommendations:  Complete abdominal ultrasound [name] in  the near future.  Screen for hepatoma, look at liver and spleen.  History of cirrhosis.  Schedule a surveillance EGD (history of esophageal varices with variceal hemorrhage - status post esophageal band ligation previously)   With the specific goal of preventing a future torrential variceal hemorrhage only. ASA 4/propofol.  The risks, benefits, limitations, alternatives and imponderables have been reviewed with the patient. Potential for esophageal dilation, biopsy, etc. have also been reviewed.  Questions  have been answered. All parties agreeable. Lets try do before the end of the year. (Lets plan for early morning procedure slot on the schedule)  Continue lactulose  -  titrate to 3 soft BMs daily  Continue Xifaxan.  Continue Protonix 40 mg daily  Continue levofloxacin  Continue Aldactone and Lasix/furosemide  Continue with a 2 g sodium diet.  As discussed, polyps remain in  the colon.  The risks of the procedure likely outweigh the benefits as discussed.  At this time, no plans a repeat colonoscopy.   moreover, patient and spouse are not desirous of a repeat colonoscopy.  Continue with in-home palliative care/home health.  Overall short-term prognosis is poor.   Notice: This dictation was prepared with Dragon dictation along with smaller phrase technology. Any transcriptional errors that result from this process are unintentional and may not be corrected upon review.

## 2021-05-13 NOTE — Patient Instructions (Addendum)
It was good to see you again today!  Complete abdominal ultrasound [name] in the near future.  Screen for hepatoma, look at liver and spleen.  History of cirrhosis.  Schedule a surveillance EGD (history of esophageal varices with variceal hemorrhage status post esophageal band ligation previously). ASA 4/propofol.  Lets try do before the end of the year. (Lets plan for early morning procedure slot on the schedule)  Continue lactulose to titrate to 3 soft BMs daily  Continue Xifaxan.  Continue Protonix 40 mg daily  Continue levofloxacin  Continue Aldactone and Lasix/furosemide  Continue with a 2 g sodium diet.  As discussed, polyps remain in your colon.  The risks of the procedure likely outweigh the benefits as discussed.  At this time, and I have no plans to have you undergo a repeat colonoscopy.

## 2021-05-16 NOTE — Patient Instructions (Signed)
Jeffrey [name]  05/16/2021     @PREFPERIOPPHARMACY @   Your procedure is scheduled on  05/22/2021.   Report to [name] at  0700  A.M.   Call this number if you have problems the morning of surgery:  [phone]   Remember:  Follow the diet instructions given to you by the office.    Take these medicines the morning of surgery with A SIP OF WATER                 Xanax(if needed), diltiazem, gabapentin, hydroxyzine, metoprolol, zofran (if needed), protonix, zanaflex, effexor.     Do not wear jewelry, make-up or nail polish.  Do not wear lotions, powders, or perfumes, or deodorant.  Do not shave 48 hours prior to surgery.  Men may shave face and neck.  Do not bring valuables to the hospital.  [facility] is not responsible for any belongings or valuables.  Contacts, dentures or bridgework may not be worn into surgery.  Leave your suitcase in the car.  After surgery it may be brought to your room.  For patients admitted to the hospital, discharge time will be determined by your treatment team.  Patients discharged the day of surgery will not be allowed to drive home and must have someone with them for 24 hours.    Special instructions:   DO NOT smoke tobacco or vape for 24 hours before your procedure.  Please read over the following fact sheets that you were given. Anesthesia Post-op Instructions and Care and Recovery After Surgery      Upper Endoscopy, Adult, Care After This sheet gives you information about how to care for yourself after your procedure. Your health care provider may also give you more specific instructions. If you have problems or questions, contact your health care provider. What can I expect after the procedure? After the procedure, it is common to have: A sore throat. Mild stomach pain or discomfort. Bloating. Nausea. Follow these instructions at home:  Follow instructions from your health care provider about what to eat or drink after  your procedure. Return to your normal activities as told by your health care provider. Ask your health care provider what activities are safe for you. Take over-the-counter and prescription medicines only as told by your health care provider. If you were given a sedative during the procedure, it can affect you for several hours. Do not drive or operate machinery until your health care provider says that it is safe. Keep all follow-up visits as told by your health care provider. This is important. Contact a health care provider if you have: A sore throat that lasts longer than one day. Trouble swallowing. Get help right away if: You vomit blood or your vomit looks like coffee grounds. You have: A fever. Bloody, black, or tarry stools. A severe sore throat or you cannot swallow. Difficulty breathing. Severe pain in your chest or abdomen. Summary After the procedure, it is common to have a sore throat, mild stomach discomfort, bloating, and nausea. If you were given a sedative during the procedure, it can affect you for several hours. Do not drive or operate machinery until your health care provider says that it is safe. Follow instructions from your health care provider about what to eat or drink after your procedure. Return to your normal activities as told by your health care provider. This information is not intended to replace advice given to you by your health care  provider. Make sure you discuss any questions you have with your health care provider. Document Revised: 03/24/2019 Document Reviewed: 10/18/2017 Elsevier Patient Education  2022 [place] After This sheet gives you information about how to care for yourself after your procedure. Your health care provider may also give you more specific instructions. If you have problems or questions, contact your health care provider. What can I expect after the procedure? After the procedure, it is  common to have: Tiredness. Forgetfulness about what happened after the procedure. Impaired judgment for important decisions. Nausea or vomiting. Some difficulty with balance. Follow these instructions at home: For the time period you were told by your health care provider:   Rest as needed. Do not participate in activities where you could fall or become injured. Do not drive or use machinery. Do not drink alcohol. Do not take sleeping pills or medicines that cause drowsiness. Do not make important decisions or sign legal documents. Do not take care of children on your own. Eating and drinking Follow the diet that is recommended by your health care provider. Drink enough fluid to keep your urine pale yellow. If you vomit: Drink water, juice, or soup when you can drink without vomiting. Make sure you have little or no nausea before eating solid foods. General instructions Have a responsible adult stay with you for the time you are told. It is important to have someone help care for you until you are awake and alert. Take over-the-counter and prescription medicines only as told by your health care provider. If you have sleep apnea, surgery and certain medicines can increase your risk for breathing problems. Follow instructions from your health care provider about wearing your sleep device: Anytime you are sleeping, including during daytime naps. While taking prescription pain medicines, sleeping medicines, or medicines that make you drowsy. Avoid smoking. Keep all follow-up visits as told by your health care provider. This is important. Contact a health care provider if: You keep feeling nauseous or you keep vomiting. You feel light-headed. You are still sleepy or having trouble with balance after 24 hours. You develop a rash. You have a fever. You have redness or swelling around the IV site. Get help right away if: You have trouble breathing. You have new-onset confusion at  home. Summary For several hours after your procedure, you may feel tired. You may also be forgetful and have poor judgment. Have a responsible adult stay with you for the time you are told. It is important to have someone help care for you until you are awake and alert. Rest as told. Do not drive or operate machinery. Do not drink alcohol or take sleeping pills. Get help right away if you have trouble breathing, or if you suddenly become confused. This information is not intended to replace advice given to you by your health care provider. Make sure you discuss any questions you have with your health care provider. Document Revised: 02/01/2020 Document Reviewed: 04/20/2019 Elsevier Patient Education  2022 [employer].

## 2021-05-19 ENCOUNTER — Encounter (HOSPITAL_COMMUNITY)
Admission: RE | Admit: 2021-05-19 | Discharge: 2021-05-19 | Disposition: A | Discharge status: Home / Self Care | Payer: Medicare Other | Source: Ambulatory Visit | Attending: Internal Medicine | Admitting: Internal Medicine

## 2021-05-19 ENCOUNTER — Other Ambulatory Visit: Payer: Self-pay

## 2021-05-19 ENCOUNTER — Encounter (HOSPITAL_COMMUNITY): Payer: Self-pay

## 2021-05-20 ENCOUNTER — Ambulatory Visit (HOSPITAL_COMMUNITY)
Admission: RE | Admit: 2021-05-20 | Discharge: 2021-05-20 | Disposition: A | Discharge status: Home / Self Care | Payer: Medicare Other | Source: Ambulatory Visit | Attending: Internal Medicine | Admitting: Internal Medicine

## 2021-05-20 DIAGNOSIS — K7031 Alcoholic cirrhosis of liver with ascites: Secondary | ICD-10-CM

## 2021-05-21 ENCOUNTER — Telehealth: Payer: Self-pay | Admitting: Internal Medicine

## 2021-05-21 NOTE — Telephone Encounter (Signed)
Called spouse and she is going to have patient stay on for now and see how the weather is.

## 2021-05-21 NOTE — Telephone Encounter (Signed)
Pt's wife called to say Patient is scheduled for EGD tomorrow with Dr Gala Romney and said we are suppose to be having ice and she can't get him to the hospital. She was asking if they could reschedule to one day next week. [phone]

## 2021-05-22 ENCOUNTER — Ambulatory Visit (HOSPITAL_COMMUNITY): Payer: Medicare Other | Admitting: Anesthesiology

## 2021-05-22 ENCOUNTER — Other Ambulatory Visit: Payer: Self-pay

## 2021-05-22 ENCOUNTER — Encounter (HOSPITAL_COMMUNITY): Payer: Self-pay | Admitting: Internal Medicine

## 2021-05-22 ENCOUNTER — Encounter (HOSPITAL_COMMUNITY)
Admission: RE | Disposition: A | Discharge status: Home / Self Care | Payer: Self-pay | Source: Home / Self Care | Attending: Internal Medicine

## 2021-05-22 ENCOUNTER — Ambulatory Visit (HOSPITAL_COMMUNITY)
Admission: RE | Admit: 2021-05-22 | Discharge: 2021-05-22 | Disposition: A | Discharge status: Home / Self Care | Payer: Medicare Other | Attending: Internal Medicine | Admitting: Internal Medicine

## 2021-05-22 DIAGNOSIS — Z8711 Personal history of peptic ulcer disease: Secondary | ICD-10-CM | POA: Insufficient documentation

## 2021-05-22 DIAGNOSIS — Z79899 Other long term (current) drug therapy: Secondary | ICD-10-CM | POA: Diagnosis not present

## 2021-05-22 DIAGNOSIS — Z9981 Dependence on supplemental oxygen: Secondary | ICD-10-CM | POA: Diagnosis not present

## 2021-05-22 DIAGNOSIS — D649 Anemia, unspecified: Secondary | ICD-10-CM | POA: Insufficient documentation

## 2021-05-22 DIAGNOSIS — M1990 Unspecified osteoarthritis, unspecified site: Secondary | ICD-10-CM | POA: Insufficient documentation

## 2021-05-22 DIAGNOSIS — E669 Obesity, unspecified: Secondary | ICD-10-CM | POA: Insufficient documentation

## 2021-05-22 DIAGNOSIS — K7031 Alcoholic cirrhosis of liver with ascites: Secondary | ICD-10-CM | POA: Insufficient documentation

## 2021-05-22 DIAGNOSIS — F32A Depression, unspecified: Secondary | ICD-10-CM | POA: Insufficient documentation

## 2021-05-22 DIAGNOSIS — Z87891 Personal history of nicotine dependence: Secondary | ICD-10-CM | POA: Diagnosis not present

## 2021-05-22 DIAGNOSIS — F419 Anxiety disorder, unspecified: Secondary | ICD-10-CM | POA: Insufficient documentation

## 2021-05-22 DIAGNOSIS — I8500 Esophageal varices without bleeding: Secondary | ICD-10-CM | POA: Insufficient documentation

## 2021-05-22 DIAGNOSIS — N186 End stage renal disease: Secondary | ICD-10-CM | POA: Diagnosis not present

## 2021-05-22 DIAGNOSIS — I8510 Secondary esophageal varices without bleeding: Secondary | ICD-10-CM

## 2021-05-22 DIAGNOSIS — E1122 Type 2 diabetes mellitus with diabetic chronic kidney disease: Secondary | ICD-10-CM | POA: Insufficient documentation

## 2021-05-22 DIAGNOSIS — K219 Gastro-esophageal reflux disease without esophagitis: Secondary | ICD-10-CM | POA: Diagnosis not present

## 2021-05-22 DIAGNOSIS — R0600 Dyspnea, unspecified: Secondary | ICD-10-CM | POA: Diagnosis not present

## 2021-05-22 DIAGNOSIS — I120 Hypertensive chronic kidney disease with stage 5 chronic kidney disease or end stage renal disease: Secondary | ICD-10-CM | POA: Insufficient documentation

## 2021-05-22 DIAGNOSIS — Z6832 Body mass index (BMI) 32.0-32.9, adult: Secondary | ICD-10-CM | POA: Diagnosis not present

## 2021-05-22 DIAGNOSIS — Z794 Long term (current) use of insulin: Secondary | ICD-10-CM | POA: Diagnosis not present

## 2021-05-22 DIAGNOSIS — K3189 Other diseases of stomach and duodenum: Secondary | ICD-10-CM | POA: Insufficient documentation

## 2021-05-22 HISTORY — PX: ESOPHAGOGASTRODUODENOSCOPY (EGD) WITH PROPOFOL: SHX5813

## 2021-05-22 LAB — GLUCOSE, CAPILLARY: Glucose-Capillary: 167 mg/dL — ABNORMAL HIGH (ref 70–99)

## 2021-05-22 SURGERY — ESOPHAGOGASTRODUODENOSCOPY (EGD) WITH PROPOFOL
Anesthesia: General

## 2021-05-22 MED ORDER — LIDOCAINE HCL (CARDIAC) PF 50 MG/5ML IV SOSY
PREFILLED_SYRINGE | INTRAVENOUS | Status: DC | PRN
  Administered 2021-05-22: 50 mg via INTRAVENOUS

## 2021-05-22 MED ORDER — SODIUM CHLORIDE 0.9 % IV SOLN: INTRAVENOUS | Status: DC

## 2021-05-22 MED ORDER — PROPOFOL 10 MG/ML IV BOLUS
INTRAVENOUS | Status: DC | PRN
  Administered 2021-05-22: 20 mg via INTRAVENOUS
  Administered 2021-05-22: 50 mg via INTRAVENOUS

## 2021-05-22 NOTE — Op Note (Signed)
[facility] Patient Name: Jeffrey [name] Procedure Date: 05/22/2021 7:53 AM MRN: [id number] Date of Birth: 1946-03-18 Attending MD: Norvel Richards , MD CSN: [id number] Age: 75 Admit Type: Outpatient Procedure:                Upper GI endoscopy Indications:              Esophageal varices Providers:                Norvel Richards, MD, Caprice Kluver, Aram Candela Referring MD:              Medicines:                Propofol per Anesthesia Complications:            No immediate complications. Estimated Blood Loss:     Estimated blood loss was minimal. Procedure:                Pre-Anesthesia Assessment:                           - Prior to the procedure, a History and Physical                            was performed, and patient medications and                            allergies were reviewed. The patient's tolerance of                            previous anesthesia was also reviewed. The risks                            and benefits of the procedure and the sedation                            options and risks were discussed with the patient.                            All questions were answered, and informed consent                            was obtained. Prior Anticoagulants: The patient has                            taken no previous anticoagulant or antiplatelet                            agents. ASA Grade Assessment: IV - A patient with                            severe systemic disease that is a constant threat                            to life. After reviewing the risks and benefits,  the patient was deemed in satisfactory condition to                            undergo the procedure.                           After obtaining informed consent, the endoscope was                            passed under direct vision. Throughout the                            procedure, the patient's blood pressure, pulse, and                             oxygen saturations were monitored continuously. The                            [id number]-H190 ([id number]) scope was introduced through the                            mouth, and advanced to the second part of duodenum.                            The upper GI endoscopy was accomplished without                            difficulty. The patient tolerated the procedure                            well. Scope In: 8:14:31 AM Scope Out: 8:18:35 AM Total Procedure Duration: 0 hours 4 minutes 4 seconds  Findings:      Distal esophageal scar - multiple locations representing site of prior       band placement. Slight denuding of the distal esophageal mucosa. No       significant residual varices seen. Advanced portal gastropathy with       polypoid antral mucosa. Marked friability. Pylorus patent. Examination       of bulb second portion also revealed polypoid mucosa with overlying       eroded mucosa. No ulcer or gastric varices seen.      No intervention employed today. Today Impression:               - Stigmata of prior esophageal band ligation with                            no significant varices seen. Slightly denuded                            distal esophageal mucosa. Portal gastropathy.                            Polypoid gastric and duodenal mucosa with overlying  erosions. Marked mucosal friability Moderate Sedation:      Moderate (conscious) sedation was personally administered by an       anesthesia professional. The following parameters were monitored: oxygen       saturation, heart rate, blood pressure, respiratory rate, EKG, adequacy       of pulmonary ventilation, and response to care. Recommendation:           - Patient has a contact number available for                            emergencies. The signs and symptoms of potential                            delayed complications were discussed with the                            patient. Return to normal  activities tomorrow.                            Written discharge instructions were provided to the                            patient.                           - Advance diet as tolerated.                           - Continue present medications.                           - Return to my office in 6 weeks. Procedure Code(s):        --- Professional ---                           [phone], Esophagogastroduodenoscopy, flexible,                            transoral; diagnostic, including collection of                            specimen(s) by brushing or washing, when performed                            (separate procedure) Diagnosis Code(s):        --- Professional ---                           I85.00, Esophageal varices without bleeding CPT copyright 2019 American Medical Association. All rights reserved. The codes documented in this report are preliminary and upon coder review may  be revised to meet current compliance requirements. [name]. Jodette Wik, MD Norvel Richards, MD 05/22/2021 8:34:39 AM This report has been signed electronically. Number of Addenda: 0

## 2021-05-22 NOTE — Discharge Instructions (Signed)
EGD Discharge instructions Please read the instructions outlined below and refer to this sheet in the next few weeks. These discharge instructions provide you with general information on caring for yourself after you leave the hospital. Your doctor may also give you specific instructions. While your treatment has been planned according to the most current medical practices available, unavoidable complications occasionally occur. If you have any problems or questions after discharge, please call your doctor. ACTIVITY You may resume your regular activity but move at a slower pace for the next 24 hours.  Take frequent rest periods for the next 24 hours.  Walking will help expel (get rid of) the air and reduce the bloated feeling in your abdomen.  No driving for 24 hours (because of the anesthesia (medicine) used during the test).  You may shower.  Do not sign any important legal documents or operate any machinery for 24 hours (because of the anesthesia used during the test).  NUTRITION Drink plenty of fluids.  You may resume your normal diet.  Begin with a light meal and progress to your normal diet.  Avoid alcoholic beverages for 24 hours or as instructed by your caregiver.  MEDICATIONS You may resume your normal medications unless your caregiver tells you otherwise.  WHAT YOU CAN EXPECT TODAY You may experience abdominal discomfort such as a feeling of fullness or gas pains.  FOLLOW-UP Your doctor will discuss the results of your test with you.  SEEK IMMEDIATE MEDICAL ATTENTION IF ANY OF THE FOLLOWING OCCUR: Excessive nausea (feeling sick to your stomach) and/or vomiting.  Severe abdominal pain and distention (swelling).  Trouble swallowing.  Temperature over 101 F (37.8 C).  Rectal bleeding or vomiting of blood.     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  No further treatment was needed today  Recommend continue current medical regimen  Follow-up with Roseanne Kaufman in 6 weeks  At patient request, called [name] at [phone] -reviewed findings and recommendations

## 2021-05-22 NOTE — Anesthesia Postprocedure Evaluation (Signed)
Anesthesia Post Note  Patient: Jeffrey [name]  Procedure(s) Performed: ESOPHAGOGASTRODUODENOSCOPY (EGD) WITH PROPOFOL  Patient location during evaluation: Phase II Anesthesia Type: General Level of consciousness: awake and alert and oriented Pain management: pain level controlled Vital Signs Assessment: post-procedure vital signs reviewed and stable Respiratory status: spontaneous breathing, nonlabored ventilation and respiratory function stable Cardiovascular status: blood pressure returned to baseline and stable Postop Assessment: no apparent nausea or vomiting Anesthetic complications: no   No notable events documented.   Last Vitals:  Vitals:   05/22/21 0721 05/22/21 0825  BP: (!) 186/68 (!) 149/55  Pulse: 79 72  Resp: 18 14  Temp: 36.7 C (!) 36.4 C  SpO2: 96% 97%    Last Pain:  Vitals:   05/22/21 0825  TempSrc: Oral  PainSc: 0-No pain                 Shama Monfils C Brendin Situ

## 2021-05-22 NOTE — Anesthesia Preprocedure Evaluation (Signed)
Anesthesia Evaluation  Patient identified by MRN, date of birth, ID band Patient awake    Reviewed: Allergy & Precautions, NPO status , Patient's Chart, lab work & pertinent test results  History of Anesthesia Complications Negative for: history of anesthetic complications  Airway Mallampati: II  TM Distance: >3 FB Neck ROM: Full    Dental  (+) Edentulous Upper, Edentulous Lower   Pulmonary shortness of breath, with exertion, lying and Long-Term Oxygen Therapy, pneumonia, former smoker,    Pulmonary exam normal breath sounds clear to auscultation       Cardiovascular Exercise Tolerance: Poor hypertension, Pt. on medications + DOE  Normal cardiovascular exam+ dysrhythmias Atrial Fibrillation  Rhythm:Regular Rate:Normal  1. Left ventricular ejection fraction, by estimation, is 55 to 60%. The left ventricle has normal function. The left ventricle has no regional wall motion abnormalities. There is mild left ventricular hypertrophy.  Left ventricular diastolic parameters are indeterminate.  2. Right ventricular systolic function is normal. The right ventricular size is normal.  3. Left atrial size was moderately dilated.  4. Right atrial size was moderately dilated.  5. The mitral valve is normal in structure. Trivial mitral valve regurgitation. No evidence of mitral stenosis.  6. The aortic valve is tricuspid. Aortic valve regurgitation is not visualized. No aortic stenosis is present.  7. The inferior vena cava is normal in size with greater than 50% respiratory variability, suggesting right atrial pressure of 3 mmHg.    Neuro/Psych PSYCHIATRIC DISORDERS Anxiety Depression negative neurological ROS     GI/Hepatic PUD, GERD  Medicated,(+) Cirrhosis   Esophageal Varices and ascites  substance abuse (h/o alcohol abuse)  alcohol use,   Endo/Other  diabetes, Well Controlled, Type 2, Insulin Dependent  Renal/GU ESRF and  DialysisRenal disease  negative genitourinary   Musculoskeletal  (+) Arthritis ,   Abdominal (+) + obese,  Abdomen: soft.    Peds negative pediatric ROS (+)  Hematology negative hematology ROS (+) anemia ,   Anesthesia Other Findings   Reproductive/Obstetrics negative OB ROS                             Anesthesia Physical  Anesthesia Plan  ASA: 4  Anesthesia Plan: General   Post-op Pain Management:    Induction: Intravenous  PONV Risk Score and Plan: TIVA  Airway Management Planned: Nasal Cannula and Natural Airway  Additional Equipment:   Intra-op Plan:   Post-operative Plan:   Informed Consent: I have reviewed the patients History and Physical, chart, labs and discussed the procedure including the risks, benefits and alternatives for the proposed anesthesia with the patient or authorized representative who has indicated his/her understanding and acceptance.     Dental advisory given  Plan Discussed with: CRNA and Surgeon  Anesthesia Plan Comments:         Anesthesia Quick Evaluation

## 2021-05-22 NOTE — Interval H&P Note (Signed)
History and Physical Interval Note:  05/22/2021 8:01 AM  Jeffrey [name]  has presented today for surgery, with the diagnosis of HX ESOPHAGEAL VARICES WITH VARICEAL HEMORRAHE STATUS POST ESOPHAGEAL BAND LIGATION PREVIOUSLY.  The various methods of treatment have been discussed with the patient and family. After consideration of risks, benefits and other options for treatment, the patient has consented to  Procedure(s) with comments: ESOPHAGOGASTRODUODENOSCOPY (EGD) WITH PROPOFOL (N/A) - 8:15am, dialysis patient as a surgical intervention.  The patient's history has been reviewed, patient examined, no change in status, stable for surgery.  I have reviewed the patient's chart and labs.  Questions were answered to the patient's satisfaction.     Adell Panek  No change.  EGD with possible esophageal band ligation per plan today.  Risk benefits reviewed.  Discussed with anesthesia.  ASA 4/propofol.

## 2021-05-22 NOTE — Transfer of Care (Signed)
Immediate Anesthesia Transfer of Care Note  Patient: Jeffrey [name]  Procedure(s) Performed: ESOPHAGOGASTRODUODENOSCOPY (EGD) WITH PROPOFOL  Patient Location: Short Stay  Anesthesia Type:General  Level of Consciousness: sedated and patient cooperative  Airway & Oxygen Therapy: Patient Spontanous Breathing  Post-op Assessment: Report given to RN and Post -op Vital signs reviewed and stable  Post vital signs: Reviewed and stable  Last Vitals:  Vitals Value Taken Time  BP 149/55 05/22/21 0825  Temp 36.4 C 05/22/21 0825  Pulse 72 05/22/21 0825  Resp 14 05/22/21 0825  SpO2 97 % 05/22/21 0825    Last Pain:  Vitals:   05/22/21 0825  TempSrc: Oral  PainSc: 0-No pain         Complications: No notable events documented.

## 2021-05-22 NOTE — Anesthesia Procedure Notes (Signed)
Date/Time: 05/22/2021 8:08 AM Performed by: Vista Deck, CRNA Pre-anesthesia Checklist: Patient identified, Emergency Drugs available, Suction available, Timeout performed and Patient being monitored Patient Re-evaluated:Patient Re-evaluated prior to induction Oxygen Delivery Method: Nasal Cannula

## 2021-05-27 LAB — POCT I-STAT, CHEM 8
BUN: 21 mg/dL (ref 8–23)
Calcium, Ion: 1.11 mmol/L — ABNORMAL LOW (ref 1.15–1.40)
Chloride: 91 mmol/L — ABNORMAL LOW (ref 98–111)
Creatinine, Ser: 2.7 mg/dL — ABNORMAL HIGH (ref 0.61–1.24)
Glucose, Bld: 194 mg/dL — ABNORMAL HIGH (ref 70–99)
HCT: 34 % — ABNORMAL LOW (ref 39.0–52.0)
Hemoglobin: 11.6 g/dL — ABNORMAL LOW (ref 13.0–17.0)
Potassium: 3.5 mmol/L (ref 3.5–5.1)
Sodium: 132 mmol/L — ABNORMAL LOW (ref 135–145)
TCO2: 27 mmol/L (ref 22–32)

## 2021-05-28 ENCOUNTER — Other Ambulatory Visit: Payer: Self-pay

## 2021-05-28 ENCOUNTER — Encounter (HOSPITAL_COMMUNITY): Payer: Self-pay | Admitting: Internal Medicine

## 2021-05-28 DIAGNOSIS — K7031 Alcoholic cirrhosis of liver with ascites: Secondary | ICD-10-CM

## 2021-05-29 ENCOUNTER — Other Ambulatory Visit: Payer: Self-pay

## 2021-05-29 ENCOUNTER — Other Ambulatory Visit (HOSPITAL_COMMUNITY)
Admission: RE | Admit: 2021-05-29 | Discharge: 2021-05-29 | Disposition: A | Discharge status: Home / Self Care | Payer: Medicare Other | Source: Ambulatory Visit | Attending: Internal Medicine | Admitting: Internal Medicine

## 2021-05-29 DIAGNOSIS — K7031 Alcoholic cirrhosis of liver with ascites: Secondary | ICD-10-CM | POA: Diagnosis present

## 2021-05-29 LAB — BASIC METABOLIC PANEL
Anion gap: 6 (ref 5–15)
BUN: 20 mg/dL (ref 8–23)
CO2: 26 mmol/L (ref 22–32)
Calcium: 8.3 mg/dL — ABNORMAL LOW (ref 8.9–10.3)
Chloride: 95 mmol/L — ABNORMAL LOW (ref 98–111)
Creatinine, Ser: 2.57 mg/dL — ABNORMAL HIGH (ref 0.61–1.24)
GFR, Estimated: 25 mL/min — ABNORMAL LOW (ref >60)
Glucose, Bld: 285 mg/dL — ABNORMAL HIGH (ref 70–99)
Potassium: 3.8 mmol/L (ref 3.5–5.1)
Sodium: 127 mmol/L — ABNORMAL LOW (ref 135–145)

## 2021-06-02 NOTE — Progress Notes (Deleted)
Cath Change/ Replacement  Patient is present today for a catheter change due to urinary retention.  9 ml of water was removed from the balloon, a 16 FR foley cath was removed with out difficulty.  Patient was cleaned and prepped in a sterile fashion with betadine. A 16 FR foley cath was replaced into the bladder no complications were noted Urine return was noted 10 ml and urine was yellow in color. The balloon was filled with 10ml of sterile water. A leg bag was attached for drainage.  A night bag was also given to the patient and patient was given instruction on how to change from one bag to another. Patient was given proper instruction on catheter care.    Performed by: SHANNON MCGOWAN, PA-C   Follow up: One month for Foley exchange   

## 2021-06-03 ENCOUNTER — Other Ambulatory Visit: Payer: Self-pay

## 2021-06-03 ENCOUNTER — Ambulatory Visit (INDEPENDENT_AMBULATORY_CARE_PROVIDER_SITE_OTHER): Payer: Medicare Other

## 2021-06-03 DIAGNOSIS — R339 Retention of urine, unspecified: Secondary | ICD-10-CM | POA: Diagnosis not present

## 2021-06-03 NOTE — Progress Notes (Signed)
Cath Change/ Replacement  Patient is present today for a catheter change due to urinary retention.  51ml of water was removed from the balloon, a 16FR foley cath was removed with out difficulty.  Patient was cleaned and prepped in a sterile fashion with betadine. A 16 FR foley cath was replaced into the bladder no complications were noted Urine return was noted 110ml and urine was yellow in color. The balloon was filled with 63ml of sterile water. A leg bag was attached for drainage.  A night bag was also given to the patient and patient was given instruction on how to change from one bag to another. Patient was given proper instruction on catheter care.    Performed by: Janett Billow

## 2021-06-05 ENCOUNTER — Other Ambulatory Visit: Payer: Self-pay

## 2021-06-05 ENCOUNTER — Inpatient Hospital Stay (HOSPITAL_COMMUNITY): Payer: Medicare Other

## 2021-06-05 ENCOUNTER — Other Ambulatory Visit (HOSPITAL_COMMUNITY): Payer: Self-pay | Admitting: *Deleted

## 2021-06-05 ENCOUNTER — Inpatient Hospital Stay (HOSPITAL_COMMUNITY): Payer: Medicare Other | Attending: Hematology

## 2021-06-05 VITALS — BP 152/61 | HR 76 | Temp 97.1°F | Resp 18

## 2021-06-05 DIAGNOSIS — D500 Iron deficiency anemia secondary to blood loss (chronic): Secondary | ICD-10-CM

## 2021-06-05 DIAGNOSIS — E538 Deficiency of other specified B group vitamins: Secondary | ICD-10-CM | POA: Insufficient documentation

## 2021-06-05 DIAGNOSIS — D61818 Other pancytopenia: Secondary | ICD-10-CM | POA: Diagnosis present

## 2021-06-05 DIAGNOSIS — D649 Anemia, unspecified: Secondary | ICD-10-CM

## 2021-06-05 LAB — CBC WITH DIFFERENTIAL/PLATELET
Abs Immature Granulocytes: 0.01 10*3/uL (ref 0.00–0.07)
Basophils Absolute: 0 10*3/uL (ref 0.0–0.1)
Basophils Relative: 1 %
Eosinophils Absolute: 0.1 10*3/uL (ref 0.0–0.5)
Eosinophils Relative: 5 %
HCT: 25.1 % — ABNORMAL LOW (ref 39.0–52.0)
Hemoglobin: 7.9 g/dL — ABNORMAL LOW (ref 13.0–17.0)
Immature Granulocytes: 0 %
Lymphocytes Relative: 10 %
Lymphs Abs: 0.3 10*3/uL — ABNORMAL LOW (ref 0.7–4.0)
MCH: 32.4 pg (ref 26.0–34.0)
MCHC: 31.5 g/dL (ref 30.0–36.0)
MCV: 102.9 fL — ABNORMAL HIGH (ref 80.0–100.0)
Monocytes Absolute: 0.2 10*3/uL (ref 0.1–1.0)
Monocytes Relative: 7 %
Neutro Abs: 2.2 10*3/uL (ref 1.7–7.7)
Neutrophils Relative %: 77 %
Platelets: 78 10*3/uL — ABNORMAL LOW (ref 150–400)
RBC: 2.44 MIL/uL — ABNORMAL LOW (ref 4.22–5.81)
RDW: 16.2 % — ABNORMAL HIGH (ref 11.5–15.5)
WBC: 2.9 10*3/uL — ABNORMAL LOW (ref 4.0–10.5)
nRBC: 0 % (ref 0.0–0.2)

## 2021-06-05 LAB — SAMPLE TO BLOOD BANK

## 2021-06-05 MED ORDER — CYANOCOBALAMIN 1000 MCG/ML IJ SOLN
1000.0000 ug | Freq: Once | INTRAMUSCULAR | Status: AC
  Administered 2021-06-05: 1000 ug via INTRAMUSCULAR
  Filled 2021-06-05: qty 1

## 2021-06-05 NOTE — Patient Instructions (Signed)
[facility]  Discharge Instructions: Thank you for choosing [facility] to provide your oncology and hematology care.  If you have a lab appointment with the Cancer Center, please come in thru the Main Entrance and check in at the main information desk.  Wear comfortable clothing and clothing appropriate for easy access to any Portacath or PICC line.   We strive to give you quality time with your provider. You may need to reschedule your appointment if you arrive late (15 or more minutes).  Arriving late affects you and other patients whose appointments are after yours.  Also, if you miss three or more appointments without notifying the office, you may be dismissed from the clinic at the provider's discretion.      For prescription refill requests, have your pharmacy contact our office and allow 72 hours for refills to be completed.    Today you received the following chemotherapy and/or immunotherapy agents B12      To help prevent nausea and vomiting after your treatment, we encourage you to take your nausea medication as directed.  BELOW ARE SYMPTOMS THAT SHOULD BE REPORTED IMMEDIATELY: *FEVER GREATER THAN 100.4 F (38 C) OR HIGHER *CHILLS OR SWEATING *NAUSEA AND VOMITING THAT IS NOT CONTROLLED WITH YOUR NAUSEA MEDICATION *UNUSUAL SHORTNESS OF BREATH *UNUSUAL BRUISING OR BLEEDING *URINARY PROBLEMS (pain or burning when urinating, or frequent urination) *BOWEL PROBLEMS (unusual diarrhea, constipation, pain near the anus) TENDERNESS IN MOUTH AND THROAT WITH OR WITHOUT PRESENCE OF ULCERS (sore throat, sores in mouth, or a toothache) UNUSUAL RASH, SWELLING OR PAIN  UNUSUAL VAGINAL DISCHARGE OR ITCHING   Items with * indicate a potential emergency and should be followed up as soon as possible or go to the Emergency Department if any problems should occur.  Please show the CHEMOTHERAPY ALERT CARD or IMMUNOTHERAPY ALERT CARD at check-in to the Emergency Department  and triage nurse.  Should you have questions after your visit or need to cancel or reschedule your appointment, please contact [facility] [phone]  and follow the prompts.  Office hours are 8:00 a.m. to 4:30 p.m. Monday - Friday. Please note that voicemails left after 4:00 p.m. may not be returned until the following business day.  We are closed weekends and major holidays. You have access to a nurse at all times for urgent questions. Please call the main number to the clinic [phone] and follow the prompts.  For any non-urgent questions, you may also contact your provider using MyChart. We now offer e-Visits for anyone 18 and older to request care online for non-urgent symptoms. For details visit [email or web address].   Also download the MyChart app! Go to the app store, search "MyChart", open the app, select [facility], and log in with your MyChart username and password.  Due to Covid, a mask is required upon entering the hospital/clinic. If you do not have a mask, one will be given to you upon arrival. For doctor visits, patients may have 1 support person aged 18 or older with them. For treatment visits, patients cannot have anyone with them due to current Covid guidelines and our immunocompromised population.  

## 2021-06-05 NOTE — Progress Notes (Signed)
Although Hgb today was not low enough to need blood transfusion, he has dropped from his last level.  Instead of waiting another 4 weeks to recheck, please have him come back in 2 weeks for another CBC and sample to blood bank.  Educate on alarm symptoms that would warrant prompt medical attention.

## 2021-06-05 NOTE — Progress Notes (Signed)
Jeffrey [name] presents today for B12 injection per the provider's orders.  Stable during administration without incident; injection site WNL; see MAR for injection details.  Patient tolerated procedure well and without incident.  No questions or complaints noted at this time. Discharge from clinic via wheelchair in stable condition.  Alert and oriented X 3.  Follow up with [facility] as scheduled.

## 2021-06-05 NOTE — Progress Notes (Signed)
Patient's wife notified and verbalized understanding of instructions.  Advised to seek medical attention if he becomes symptomatic.  Symptoms reviewed with her.

## 2021-06-17 ENCOUNTER — Inpatient Hospital Stay (HOSPITAL_COMMUNITY): Payer: Medicare Other

## 2021-06-17 ENCOUNTER — Other Ambulatory Visit: Payer: Self-pay

## 2021-06-17 DIAGNOSIS — D649 Anemia, unspecified: Secondary | ICD-10-CM

## 2021-06-17 DIAGNOSIS — D61818 Other pancytopenia: Secondary | ICD-10-CM | POA: Diagnosis not present

## 2021-06-17 DIAGNOSIS — D500 Iron deficiency anemia secondary to blood loss (chronic): Secondary | ICD-10-CM

## 2021-06-17 LAB — CBC WITH DIFFERENTIAL/PLATELET
Abs Immature Granulocytes: 0.01 10*3/uL (ref 0.00–0.07)
Basophils Absolute: 0 10*3/uL (ref 0.0–0.1)
Basophils Relative: 1 %
Eosinophils Absolute: 0.1 10*3/uL (ref 0.0–0.5)
Eosinophils Relative: 3 %
HCT: 23.6 % — ABNORMAL LOW (ref 39.0–52.0)
Hemoglobin: 7.3 g/dL — ABNORMAL LOW (ref 13.0–17.0)
Immature Granulocytes: 0 %
Lymphocytes Relative: 12 %
Lymphs Abs: 0.3 10*3/uL — ABNORMAL LOW (ref 0.7–4.0)
MCH: 31.3 pg (ref 26.0–34.0)
MCHC: 30.9 g/dL (ref 30.0–36.0)
MCV: 101.3 fL — ABNORMAL HIGH (ref 80.0–100.0)
Monocytes Absolute: 0.2 10*3/uL (ref 0.1–1.0)
Monocytes Relative: 8 %
Neutro Abs: 1.8 10*3/uL (ref 1.7–7.7)
Neutrophils Relative %: 76 %
Platelets: 62 10*3/uL — ABNORMAL LOW (ref 150–400)
RBC: 2.33 MIL/uL — ABNORMAL LOW (ref 4.22–5.81)
RDW: 16.5 % — ABNORMAL HIGH (ref 11.5–15.5)
WBC: 2.3 10*3/uL — ABNORMAL LOW (ref 4.0–10.5)
nRBC: 0 % (ref 0.0–0.2)

## 2021-06-17 LAB — SAMPLE TO BLOOD BANK

## 2021-06-17 NOTE — Progress Notes (Signed)
Patient called and states he is asymptomatic at this time and will keep lab appointment, as scheduled on 2/2.  Advised him to contact us if he develops symptoms and we can check sooner.  Verbalized understanding.

## 2021-06-17 NOTE — Progress Notes (Signed)
RN POOL:  How has patient been feeling?  If he is symptomatic, he may need blood transfusion due to worsening Hgb < 7.5.  However, if he feels fine, we can hold off on that for now.  I would like a repeat CBC and sample to blood bank in 2 weeks.  Thanks!

## 2021-06-19 ENCOUNTER — Other Ambulatory Visit (HOSPITAL_COMMUNITY): Payer: Medicare Other

## 2021-07-01 ENCOUNTER — Ambulatory Visit (INDEPENDENT_AMBULATORY_CARE_PROVIDER_SITE_OTHER): Payer: Medicare Other

## 2021-07-01 ENCOUNTER — Other Ambulatory Visit: Payer: Self-pay

## 2021-07-01 DIAGNOSIS — R339 Retention of urine, unspecified: Secondary | ICD-10-CM

## 2021-07-01 NOTE — Progress Notes (Signed)
Cath Change/ Replacement  Patient is present today for a catheter change due to urinary retention.  33ml of water was removed from the balloon, a 16FR foley cath was removed with out difficulty.  Patient was cleaned and prepped in a sterile fashion with betadine. A 16 FR foley cath was replaced into the bladder no complications were noted Urine return was noted 54ml and urine was yellow in color. The balloon was filled with 20ml of sterile water. A leg bag was attached for drainage.  A night bag was also given to the patient and patient was given instruction on how to change from one bag to another. Patient was given proper instruction on catheter care.    Performed by: Estill Bamberg RN  Follow up: 1 month cath change

## 2021-07-02 NOTE — Progress Notes (Addendum)
[address] [place], [place] [address]   CLINIC:  Medical Oncology/Hematology  PCP:  Monico Blitz, MD [place] [address] [phone]   REASON FOR VISIT:  Follow-up for iron deficiency anemia, anemia of ESRD   PRIOR THERAPY: Intermittent IV iron supplementation   CURRENT THERAPY: Epogen (at dialysis), IV iron supplementation, PRBC transfusions as needed, monthly B12 injections  INTERVAL HISTORY:  Jeffrey [name] 76 y.o. male returns for routine follow-up of his iron deficiency anemia, anemia of ESRD, thrombocytopenia, and leukopenia.  He was last seen by Tarri Abernethy PA-C on 04/29/2021.  At today's visit, he reports feeling fair.  No recent hospitalizations, surgeries, or changes in baseline health status.  In the past several months, he has only had 1 episode of gross GI blood loss.  He reports gross hematochezia and rectal bleeding 3 to 4 weeks ago.  He denies any black and tarry bowel movements.  No epistaxis or hematuria.  However, he does have severe bruising, especially on his left arm following an injury to his left shoulder ligament.  He has lost about 20 pounds in 2 months, which he and his wife attributed to him eating less and getting full quicker than he used to.  He denies any fevers, chills, or frequent infections.  Continues to have significant fatigue, as well as pica, headaches, dyspnea on exertion.  He denies any restless legs, chest pain, lightheadedness, or syncope.  He has 25% energy and 50% appetite. He endorses that he is maintaining a stable weight.   REVIEW OF SYSTEMS:  Review of Systems  Constitutional:  Positive for fatigue. Negative for appetite change, chills, diaphoresis, fever and unexpected weight change.  HENT:   Negative for lump/mass and nosebleeds.   Eyes:  Negative for eye problems.  Respiratory:  Positive for cough and shortness of breath. Negative for hemoptysis.   Cardiovascular:  Negative for chest  pain, leg swelling and palpitations.  Gastrointestinal:  Positive for abdominal pain, blood in stool and vomiting. Negative for constipation, diarrhea and nausea.  Genitourinary:  Negative for hematuria.   Musculoskeletal:  Positive for back pain.  Skin: Negative.   Neurological:  Positive for dizziness and headaches. Negative for light-headedness.  Hematological:  Bruises/bleeds easily.  Psychiatric/Behavioral:  Positive for depression. The patient is nervous/anxious.      PAST MEDICAL/SURGICAL HISTORY:  Past Medical History:  Diagnosis Date   A-fib [facility])    when initially stating dialysis january 2021 at [facility] per spouse; never heard anything else about it   Alcoholic cirrhosis [facility])    patient reports completing hep a and b vaccines in 2006   Anemia    has had 3 units of prbcs 2013   Anxiety    Arthritis    Asymptomatic gallstones    Ultrasound in 2006   B12 deficiency 11/21/2020   C. difficile colitis    Cholelithiasis    Chronic kidney disease    Dialysis M/W/F/Sa   Depression    Diabetes ([place])    Duodenal ulcer with hemorrhage    per patient in 2006 or 2007, records have been requested   Dyspnea    low oxygen level -  has O2 2 L all day   Esophageal varices ([place])    see [facility]   GERD (gastroesophageal reflux disease)    Heart burn    History of alcohol abuse    quit 05/2013   HOH (hard of hearing)    Hypertension  Liver cirrhosis ([facility])    ETOH   SBP (spontaneous bacterial peritonitis) ([place])    2020, November 2021.    Splenomegaly    Ultrasound in 2006   Past Surgical History:  Procedure Laterality Date   AGILE CAPSULE N/A 09/20/2019   Procedure: AGILE CAPSULE;  Surgeon: Daneil Dolin, MD;  [facility];  Service: Endoscopy;  Laterality: N/A;   APPENDECTOMY     AV FISTULA PLACEMENT Left 08/17/2019   Procedure: ARTERIOVENOUS (AV) FISTULA CREATION VERSES ARTERIOVENOUS GRAFT;  Surgeon: [name], MD;  [facility]: [facility] OR;  Service:  Vascular;  Laterality: Left;   BASCILIC VEIN TRANSPOSITION Left 09/28/2019   Procedure: LEFT SECOND STAGE [place];  Surgeon: [name], MD;  Location: [place];  Service: Vascular;  Laterality: Left;   BIOPSY  02/03/2018   Procedure: BIOPSY;  Surgeon: Daneil Dolin, MD;  [facility];  Service: Endoscopy;;  bx of gastric polyps   BIOPSY  09/19/2019   Procedure: BIOPSY;  Surgeon: Danie Binder, MD;  [facility];  Service: Endoscopy;;   BIOPSY  01/16/2021   Procedure: BIOPSY;  Surgeon: Daneil Dolin, MD;  [facility];  Service: Endoscopy;;   CATARACT EXTRACTION Right    COLONOSCOPY  03/10/2005   Rectal polyp as described above, removed with snare. Left sided  diverticula. The remainder of the colonic mucosa appeared normal. Inflammed polyp on path.   COLONOSCOPY  04/1999   Dr. Thea Silversmith polyps removed,  Path showed hyperplastic   COLONOSCOPY  10/2015   Dr. Britta Mccreedy: diverticulosis, single sessile tubular adenoma 3-29m in size removed from descending colon.    COLONOSCOPY WITH ESOPHAGOGASTRODUODENOSCOPY (EGD)  03/31/2012   RESP:[id number]. Colonic diverticulosis. tubular adenoma colon   COLONOSCOPY WITH ESOPHAGOGASTRODUODENOSCOPY (EGD)  06/2019   FORSYTH: small esophageal varices without high risk stigmata, single large AVM on lesser curvature in gastric body s/p APC ablation, gastric antral and duodenal bulb polyposis. No significant source to explain transfusion dependent anemia. Colonoscopy with portal colopathy and diffuse edema, changes of Cdiff colitis on colonoscopy   COLONOSCOPY WITH PROPOFOL N/A 01/19/2021   Surgeon: CDaryel November MD; 15 mm polyp in descending colon removed piecemeal, 6 mm polyp in ascending colon and 12 mm polyp in sigmoid colon not removed due to high bleeding risk, benign polypoid lesion at splenic flexure, diverticulosis in sigmoid and descending colon, rectal varices.   COLONOSCOPY WITH PROPOFOL  N/A 01/16/2021   Surgeon: RDaneil Dolin MD;  Portal colopathy, large rectal varices, polypoid mass at splenic flexure biopsy, two 1 cm left colon polyps not removed, left-sided diverticulosis.  Reported patient had bleeding from splenic flexure lesion.  Pathology with acutely inflamed colonic mucosa with localized ulceration and prominent reactive changes.   ESOPHAGEAL BANDING N/A 02/03/2018   Procedure: ESOPHAGEAL BANDING;  Surgeon: RDaneil Dolin MD;  [facility];  Service: Endoscopy;  Laterality: N/A;   ESOPHAGEAL BANDING  01/16/2021   Procedure: ESOPHAGEAL BANDING;  Surgeon: RDaneil Dolin MD;  [facility];  Service: Endoscopy;;   ESOPHAGOGASTRODUODENOSCOPY  01/15/2005   Three columns grade 1 to 2 esophageal varices, otherwise normal esophageal mucosa.  Esophagus was not manipulated otherwise./Nodularity of the antrum with overlying erosions, nonspecific finding. Path showed rare H.pylori   ESOPHAGOGASTRODUODENOSCOPY  09/2008   Dr. RGaylene Brookscolumns of grade 2-3 esoph varices, only one column was prominent. Portal gastropathy, multiple gastrc polyps at antrum, two were 2cm with black eschar, bulbar  polyps, bulbar erosions   ESOPHAGOGASTRODUODENOSCOPY  11/2004   Dr. Brantley Stage 3 esoph varices   ESOPHAGOGASTRODUODENOSCOPY  03/31/2012   RMR: 4 columns(3-GR 2, 1-GR1) non-bleeding esophageal varices, portal gastropathy, small HH, early GAVE, multiple gastric polyps    ESOPHAGOGASTRODUODENOSCOPY (EGD) WITH PROPOFOL N/A 02/03/2018   Dr. Gala Romney: Esophageal varices, 3 columns grade 1-2.  Portal hypertensive gastropathy.  Multiple gastric polyps, biopsy consistent with hyperplastic.   ESOPHAGOGASTRODUODENOSCOPY (EGD) WITH PROPOFOL N/A 09/19/2019   Fields: grade I and II esophageal varcies, mild portal hypertensive gastropathy, moderate gastritis but no H. pylori, single hyperplastic gastric polyp removed, obvious source for melena/transfusion dependent anemia not  identified, may be due to friable gastric and duodenal mucosa in the setting of portal hypertension   ESOPHAGOGASTRODUODENOSCOPY (EGD) WITH PROPOFOL N/A 01/16/2021   Surgeon: Daneil Dolin, MD; Portal hypertensive gastropathy, grade 2 esophageal varices with bleeding stigmata s/p banding x4, slight nodularity and congestion involving bulb and second portion of duodenum, nonspecific.  Recommended repeat EGD in 4 weeks.   ESOPHAGOGASTRODUODENOSCOPY (EGD) WITH PROPOFOL N/A 05/22/2021   Procedure: ESOPHAGOGASTRODUODENOSCOPY (EGD) WITH PROPOFOL;  Surgeon: Daneil Dolin, MD;  [facility];  Service: Endoscopy;  Laterality: N/A;  8:15am, dialysis patient   GASTRIC VARICES BANDING  03/31/2012   Procedure: GASTRIC VARICES BANDING;  Surgeon: Daneil Dolin, MD;  [facility];  Service: Endoscopy;  Laterality: N/A;   GIVENS CAPSULE STUDY N/A 09/21/2019   Poor study with a lot of debris obstructing much of the view of the first approximate 3 hours out of 6 hours.  No obvious source of bleeding identified.  Sequela of bleeding and old blood seen in the form of"whisps" of blood, blood flecks mostly toward the end of the study.   HEMOSTASIS CLIP PLACEMENT  01/19/2021   Procedure: HEMOSTASIS CLIP PLACEMENT;  Surgeon: [name], MD;  Location: [facility] ENDOSCOPY;  Service: Gastroenterology;;   IR REMOVAL TUN CV CATH W/O FL  02/15/2020   POLYPECTOMY  09/19/2019   Procedure: POLYPECTOMY;  Surgeon: Danie Binder, MD;  [facility];  Service: Endoscopy;;   POLYPECTOMY  01/19/2021   Procedure: POLYPECTOMY;  Surgeon: [name], MD;  [facility] ENDOSCOPY;  Service: Gastroenterology;;   UMBILICAL HERNIA REPAIR  2017   exploratory laparotomy, abdominal washout     SOCIAL HISTORY:  Social History   Socioeconomic History   Marital status: Married    Spouse name: Not on file   Number of children: 2   Years of education: Not on file   Highest education level: Not  on file  Occupational History   Occupation: RETIRED    Employer: SELF EMPLOYED  Tobacco Use   Smoking status: Former    Packs/day: 2.00    Years: 25.00    Pack years: 50.00    Types: Cigarettes    Quit date: 01/28/1986    Years since quitting: 35.4   Smokeless tobacco: Never  Vaping Use   Vaping Use: Never used  Substance and Sexual Activity   Alcohol use: No    Comment: quit in 05/2013   Drug use: No   Sexual activity: Not Currently  Other Topics Concern   Not on file  Social History Narrative   Two step children from second marriage (divorced) who live with him along with some grandchildren.    Social Determinants of Radio broadcast assistant Strain: Not on file  Food Insecurity: No Food Insecurity   Worried About Charity fundraiser  in the Last Year: Never true   Ran Out of Food in the Last Year: Never true  Transportation Needs: Not on file  Physical Activity: Not on file  Stress: Not on file  Social Connections: Not on file  Intimate Partner Violence: Not on file    FAMILY HISTORY:  Family History  Problem Relation Age of Onset   Colon cancer Father 78       deceased age 74   Breast cancer Sister        deceased   Diabetes Sister    Stroke Mother    Healthy Son    Healthy Daughter    Lung cancer Neg Hx    Ovarian cancer Neg Hx     CURRENT MEDICATIONS:  Outpatient Encounter Medications as of 07/03/2021  Medication Sig Note   acetaminophen (TYLENOL) 650 MG CR tablet Take 650 mg by mouth every 8 (eight) hours as needed for pain.    Alcohol Swabs (ALCOHOL PREP) 70 % PADS Apply 1 each topically 3 (three) times daily.    ALPRAZolam (XANAX) 0.5 MG tablet Take 0.5 mg by mouth See admin instructions. Take one tablet (0.5 mg) by mouth on Monday, Wednesday, Friday before dialysis, may also take one tablet (0.5 mg) daily as needed for anxiety.    Ascorbic Acid (VITAMIN C) 1000 MG tablet Take 1,000 mg by mouth in the morning.    BD PEN NEEDLE MICRO U/F 32G X 6 MM  MISC SMARTSIG:SUB-Q Daily PRN    calcium carbonate (TUMS - DOSED IN MG ELEMENTAL CALCIUM) 500 MG chewable tablet Chew 1 tablet by mouth 3 (three) times daily with meals.    Calcium Carbonate-Simethicone (ALKA-SELTZER HEARTBURN + GAS) 750-80 MG CHEW Chew 1-2 tablets by mouth 2 (two) times daily as needed (acid reflux).    cholecalciferol (VITAMIN D3) 25 MCG (1000 UNIT) tablet Take 1,000 Units by mouth daily.    diltiazem (TIAZAC) 120 MG 24 hr capsule Take 120 mg by mouth daily.    furosemide (LASIX) 40 MG tablet Take 80 mg by mouth daily.    gabapentin (NEURONTIN) 100 MG capsule TAKE 1 CAPSULE BY MOUTH AT BEDTIME (Patient taking differently: Take 100 mg by mouth 3 (three) times daily.)    Garlic 7564 MG CAPS Take 1,000 mg by mouth daily.    GLUCAGEN HYPOKIT 1 MG SOLR injection Inject 1 mg into the muscle once as needed for low blood sugar.    HUMALOG KWIKPEN 100 UNIT/ML KwikPen 10-50 Units 3 (three) times daily. Sliding scale    hydrocortisone (ANUSOL-HC) 2.5 % rectal cream Place 1 application rectally 2 (two) times daily as needed for hemorrhoids or anal itching.    hydrOXYzine (ATARAX/VISTARIL) 25 MG tablet Take 25 mg by mouth 3 (three) times daily. For itching    iron polysaccharides (NIFEREX) 150 MG capsule Take 150 mg by mouth daily with supper.    lactulose (CONSTULOSE) 10 GM/15ML solution TAKE 45 MLS EVERY MORNING AND TAKE 45 MLS EVERY EVENING (Patient taking differently: Take 30 g by mouth See admin instructions. Take 45 mls (30 g) by mouth on Sunday, Tuesday, Thursday, Saturday mornings (non-dialysis days) unless going to MD appointment; take 45 mls (30 g) daily at bedtime)    levofloxacin (LEVAQUIN) 500 MG tablet TAKE 1 TABLET BY MOUTH THREE TIMES EVERY WEEK AFTER DIALYSIS    metoprolol succinate (TOPROL-XL) 25 MG 24 hr tablet Take 1 tablet (25 mg total) by mouth See admin instructions. Take one tablet (25 mg) by mouth at bedtime  every night and take one tablet (25 mg) in morning on  Sun,  Tues, Thurs and Sat (non-dialysis days)    midodrine (PROAMATINE) 10 MG tablet Take 1 tablet (10 mg total) by mouth 2 (two) times daily with a meal.    milk thistle 175 MG tablet Take 175 mg by mouth 3 (three) times daily.     mirtazapine (REMERON) 30 MG tablet Take 1 tablet (30 mg total) by mouth at bedtime. (Patient taking differently: Take 30 mg by mouth daily with supper.)    Multiple Vitamin (MULTIVITAMIN WITH MINERALS) TABS tablet Take 1 tablet by mouth in the morning.     omega-3 acid ethyl esters (LOVAZA) 1 g capsule Take 2 g by mouth 2 (two) times daily.     ondansetron (ZOFRAN) 4 MG tablet Take 4 mg by mouth every 8 (eight) hours as needed for nausea or vomiting.    oxyCODONE-acetaminophen (PERCOCET/ROXICET) 5-325 MG tablet Take 1 tablet by mouth every 4 (four) hours as needed for moderate pain.    pantoprazole (PROTONIX) 40 MG tablet Take 2 tablets (80 mg total) by mouth daily.    Ped Vitamins ACD Fl-Iron (TRI-VIT/FLUORIDE/IRON PO) Take 1 tablet by mouth daily. 05/14/2021: [facility]    pravastatin (PRAVACHOL) 20 MG tablet Take 20 mg by mouth in the morning.     Probiotic Product (PROBIOTIC DAILY PO) Take 1 tablet by mouth in the morning.     spironolactone (ALDACTONE) 50 MG tablet Take 50 mg by mouth in the morning.     thiamine (VITAMIN B-1) 100 MG tablet Take 100 mg by mouth daily.    tiZANidine (ZANAFLEX) 2 MG tablet Take 2 mg by mouth 2 (two) times daily as needed for muscle spasms.    TRESIBA FLEXTOUCH 200 UNIT/ML FlexTouch Pen Inject 120 Units into the skin at bedtime.    triamcinolone cream (KENALOG) 0.1 % Apply 1 application topically 2 (two) times daily as needed (dry skin Itching).    venlafaxine (EFFEXOR) 75 MG tablet Take 75 mg by mouth 3 (three) times daily with meals.     XIFAXAN 550 MG TABS tablet TAKE 1 TABLET BY MOUTH TWICE DAILY    No facility-administered encounter medications on file as of 07/03/2021.    ALLERGIES:  Allergies  Allergen Reactions   Tape  Itching, Rash and Other (See Comments)    Tears skin     PHYSICAL EXAM:  ECOG PERFORMANCE STATUS: 2 - Symptomatic, <50% confined to bed  There were no vitals filed for this visit. There were no vitals filed for this visit. Physical Exam Constitutional:      Appearance: Normal appearance. He is obese.  HENT:     Head: Normocephalic and atraumatic.     Mouth/Throat:     Mouth: Mucous membranes are moist.  Eyes:     Extraocular Movements: Extraocular movements intact.     Pupils: Pupils are equal, round, and reactive to light.  Cardiovascular:     Rate and Rhythm: Normal rate and regular rhythm.     Pulses: Normal pulses.     Heart sounds: Murmur heard.  Pulmonary:     Effort: Pulmonary effort is normal.     Breath sounds: Decreased breath sounds and rales present.     Comments: Faint crackles and decreased air movement bilaterally Abdominal:     General: Bowel sounds are normal. There is distension.     Palpations: Abdomen is soft.     Tenderness: There is no abdominal tenderness.  Musculoskeletal:  General: No swelling.     Right lower leg: Edema (1+ edema with dusky red hyperpigmentation of skin) present.     Left lower leg: Edema (1+ edema with dusky red hyperpigmentation of skin) present.  Lymphadenopathy:     Cervical: No cervical adenopathy.  Skin:    General: Skin is warm and dry.     Findings: Bruising (Scattered bruising on all 4 extremities, with large confluent bruise on right proximal arm extending from shoulder to elbow) present.  Neurological:     General: No focal deficit present.     Mental Status: He is alert and oriented to person, place, and time.  Psychiatric:        Mood and Affect: Mood normal.        Behavior: Behavior normal.     LABORATORY DATA:  I have reviewed the labs as listed.  CBC    Component Value Date/Time   WBC 2.3 (L) 06/17/2021 1043   RBC 2.33 (L) 06/17/2021 1043   HGB 7.3 (L) 06/17/2021 1043   HCT 23.6 (L) 06/17/2021  1043   HCT 28 02/25/2012 1034   PLT 62 (L) 06/17/2021 1043   PLT 125 02/25/2012 1034   MCV 101.3 (H) 06/17/2021 1043   MCV 82.0 02/25/2012 1034   MCH 31.3 06/17/2021 1043   MCHC 30.9 06/17/2021 1043   RDW 16.5 (H) 06/17/2021 1043   LYMPHSABS 0.3 (L) 06/17/2021 1043   MONOABS 0.2 06/17/2021 1043   EOSABS 0.1 06/17/2021 1043   BASOSABS 0.0 06/17/2021 1043   CMP Latest Ref Rng & Units 05/29/2021 05/22/2021 04/14/2021  Glucose 70 - 99 mg/dL 285(H) 194(H) 204(H)  BUN 8 - 23 mg/dL 20 21 33(H)  Creatinine 0.61 - 1.24 mg/dL 2.57(H) 2.70(H) 3.88(H)  Sodium 135 - 145 mmol/L 127(L) 132(L) 125(L)  Potassium 3.5 - 5.1 mmol/L 3.8 3.5 4.1  Chloride 98 - 111 mmol/L 95(L) 91(L) 88(L)  CO2 22 - 32 mmol/L 26 - 26  Calcium 8.9 - 10.3 mg/dL 8.3(L) - 8.7(L)  Total Protein 6.5 - 8.1 g/dL - - 6.8  Total Bilirubin 0.3 - 1.2 mg/dL - - 1.0  Alkaline Phos 38 - 126 U/L - - 191(H)  AST 15 - 41 U/L - - 36  ALT 0 - 44 U/L - - 26    DIAGNOSTIC IMAGING:  I have independently reviewed the relevant imaging and discussed with the patient.  ASSESSMENT & PLAN: 1.  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 - Other labs (07/03/2021): Ferritin 247, iron saturation 23%.  CMP at baseline.  LDH normal.  B12 at goal, methylmalonic acid pending. - MDS has not yet been investigated or ruled out via bone marrow biopsy, as there would be no change in the patient's treatment plan regardless of results.  Due to his overall fragile state of health with poor prognosis, supportive care remains his best treatment option; he would not be a candidate for any chemotherapeutic options. - PLAN: Blood counts are stable at baseline.  We will defer bone marrow biopsy at this time.  Further plan as below.   2.  Macrocytic anemia, transfusion-dependent - Anemia is related  to his ESRD (receives Epogen at dialysis), end-stage liver disease/cirrhosis, and intermittent GI bleeding.  - Macrocytosis secondary to cirrhotic liver disease - He is now transfusion dependent, and has been receiving PRBC transfusions on average  every 4-8 weeks. - Intermittent melena with Hemoccult positive stool and extensive work-up for gastrointestinal blood loss: EGD (01/16/2021): Portal hypertensive gastropathy, grade 2 esophageal varices with bleeding stigmata s/p esophageal band ligation Colonoscopy (01/16/2021): Portal colopathy with large rectal varices, polypoid mass at splenic flexure thought to be source of bleeding. Repeat EGD (05/22/2021): Portal gastropathy with marked mucosal friability; polypoid gastric and duodenal mucosa with overlying erosions.  Stigmata of prior esophageal band ligation with no significant varices seen. - Hospitalized from 01/17/2021 through 01/20/2021 with rectal bleeding and acute blood loss anemia (Hgb 5.9).  Colonoscopy (01/19/2021) showed benign polypoid lesion at splenic flexure, likely inflammatory polyp thought to be source of bleeding, with possibility of variceal hemorrhage as well.  He received 4 units PRBC during this hospital stay. - Work-up with SPEP and immunofixation was negative. Hemolysis labs were negative with normal LDH and reticulocyte count. - Most recent IV iron (Venofer) June/July 2022 - Receives Epogen at dialysis - Last PRBC transfusion x4 during hospitalization in August 2022 - Labs today (07/03/2021): Hgb 6.9, ferritin 247, iron saturation 23% - Reports a single episode of rectal bleeding 3 to 4 weeks ago.  Also had a shoulder injury last week with extensive bruising of his right arm. - Symptomatic with chronic fatigue at baseline, and other chronic symptoms related in HPI/ROS above   - PLAN: Transfuse PRBC x2 today. - No indication for IV iron at this time.   - Repeat CBC in 2 weeks.  Then continue CBC with sample to blood bank every 4  weeks.  PRBC transfusion every 4 weeks for Hgb < 7.0 (or Hgb <8.0 with symptoms). - Continue GI follow-up for chronic bleeding. - RTC in 3 months with full lab panel before visit.   3.  Thrombocytopenia and leukopenia -Thrombocytopenia and leukopenia due to are thought to be secondary to his chronic liver disease and splenomegaly. -CTAP on 04/01/2020 shows persistent splenomegaly - Most recent labs (07/03/2021) WBC and platelets at baseline.  No B12 or folate deficiencies when last checked. - Hospitalized for SBP in November 2022.  No other recent infections. - Patient denies frequent infections.  Reports easy bruising but denies significant bleeding events.   - PLAN: Continue to monitor with repeat CBCs.   4.  B12 deficiency - Anemia mildly macrocytic (in part due to liver disease) - Noted at last appointment to have persistent B12 deficiency despite oral repletion, and was therefore switched to injections - Patient is receiving monthly B12 injections  - Most recent labs (07/03/2021): B12 is 1391, methylmalonic acid pending - PLAN: Continue monthly B12 injections.  Reassess in 3 months with repeat labs.   5.  End-stage renal disease: -He gets dialysis Monday-Wednesday-Friday -He receives Epogen injections at his dialysis.   6. Cirrhosis / End-stage Liver Disease - Receives paracentesis every 2 weeks - Follows regularly with gastroenterology  7.  Weight loss - Patient has lost 20 pounds in 2 months, which he attributes to poor appetite and not eating well - PLAN: Encouraged patient to drink Ensure at least once daily.  We will continue to follow weight at next visit.  If he continues to have unintentional weight loss would consider referral to dietitian and possible imaging studies if indicated.    PLAN SUMMARY & DISPOSITION: PRBC today x2 units CBC with sample to blood bank in 2 weeks, then monthly CBC/BB sample Monthly B12 injection Full lab panel in 3 months RTC after labs for  follow-up visit  All questions were answered. The  patient knows to call the clinic with any problems, questions or concerns.  Medical decision making: Moderate  Time spent on visit: I spent 30 minutes counseling the patient face to face. The total time spent in the appointment was 55 minutes and more than 50% was on counseling.   Harriett Rush, PA-C  07/03/2021 6:43 PM

## 2021-07-03 ENCOUNTER — Other Ambulatory Visit: Payer: Self-pay | Admitting: *Deleted

## 2021-07-03 ENCOUNTER — Inpatient Hospital Stay (HOSPITAL_COMMUNITY): Payer: Medicare Other

## 2021-07-03 ENCOUNTER — Other Ambulatory Visit: Payer: Self-pay

## 2021-07-03 ENCOUNTER — Inpatient Hospital Stay (HOSPITAL_COMMUNITY): Payer: Medicare Other | Attending: Physician Assistant

## 2021-07-03 ENCOUNTER — Inpatient Hospital Stay (HOSPITAL_COMMUNITY): Payer: Medicare Other | Admitting: Physician Assistant

## 2021-07-03 VITALS — BP 175/73 | HR 75 | Temp 97.8°F | Resp 18

## 2021-07-03 VITALS — BP 164/70 | HR 72 | Resp 16 | Ht 67.0 in | Wt 188.9 lb

## 2021-07-03 DIAGNOSIS — E538 Deficiency of other specified B group vitamins: Secondary | ICD-10-CM

## 2021-07-03 DIAGNOSIS — D61818 Other pancytopenia: Secondary | ICD-10-CM

## 2021-07-03 DIAGNOSIS — D500 Iron deficiency anemia secondary to blood loss (chronic): Secondary | ICD-10-CM | POA: Diagnosis present

## 2021-07-03 DIAGNOSIS — I8501 Esophageal varices with bleeding: Secondary | ICD-10-CM | POA: Diagnosis present

## 2021-07-03 DIAGNOSIS — D649 Anemia, unspecified: Secondary | ICD-10-CM

## 2021-07-03 LAB — COMPREHENSIVE METABOLIC PANEL
ALT: 11 U/L (ref 0–44)
AST: 19 U/L (ref 15–41)
Albumin: 2.8 g/dL — ABNORMAL LOW (ref 3.5–5.0)
Alkaline Phosphatase: 139 U/L — ABNORMAL HIGH (ref 38–126)
Anion gap: 7 (ref 5–15)
BUN: 18 mg/dL (ref 8–23)
CO2: 29 mmol/L (ref 22–32)
Calcium: 8 mg/dL — ABNORMAL LOW (ref 8.9–10.3)
Chloride: 92 mmol/L — ABNORMAL LOW (ref 98–111)
Creatinine, Ser: 2.31 mg/dL — ABNORMAL HIGH (ref 0.61–1.24)
GFR, Estimated: 29 mL/min — ABNORMAL LOW (ref >60)
Glucose, Bld: 184 mg/dL — ABNORMAL HIGH (ref 70–99)
Potassium: 3.3 mmol/L — ABNORMAL LOW (ref 3.5–5.1)
Sodium: 128 mmol/L — ABNORMAL LOW (ref 135–145)
Total Bilirubin: 0.7 mg/dL (ref 0.3–1.2)
Total Protein: 6.7 g/dL (ref 6.5–8.1)

## 2021-07-03 LAB — CBC WITH DIFFERENTIAL/PLATELET
Abs Immature Granulocytes: 0.01 10*3/uL (ref 0.00–0.07)
Basophils Absolute: 0 10*3/uL (ref 0.0–0.1)
Basophils Relative: 1 %
Eosinophils Absolute: 0.1 10*3/uL (ref 0.0–0.5)
Eosinophils Relative: 5 %
HCT: 21.8 % — ABNORMAL LOW (ref 39.0–52.0)
Hemoglobin: 6.9 g/dL — CL (ref 13.0–17.0)
Immature Granulocytes: 0 %
Lymphocytes Relative: 14 %
Lymphs Abs: 0.4 10*3/uL — ABNORMAL LOW (ref 0.7–4.0)
MCH: 31.9 pg (ref 26.0–34.0)
MCHC: 31.7 g/dL (ref 30.0–36.0)
MCV: 100.9 fL — ABNORMAL HIGH (ref 80.0–100.0)
Monocytes Absolute: 0.2 10*3/uL (ref 0.1–1.0)
Monocytes Relative: 9 %
Neutro Abs: 1.8 10*3/uL (ref 1.7–7.7)
Neutrophils Relative %: 71 %
Platelets: 64 10*3/uL — ABNORMAL LOW (ref 150–400)
RBC: 2.16 MIL/uL — ABNORMAL LOW (ref 4.22–5.81)
RDW: 18.2 % — ABNORMAL HIGH (ref 11.5–15.5)
WBC: 2.6 10*3/uL — ABNORMAL LOW (ref 4.0–10.5)
nRBC: 0 % (ref 0.0–0.2)

## 2021-07-03 LAB — IRON AND TIBC
Iron: 62 ug/dL (ref 45–182)
Saturation Ratios: 23 % (ref 17.9–39.5)
TIBC: 271 ug/dL (ref 250–450)
UIBC: 209 ug/dL

## 2021-07-03 LAB — SAMPLE TO BLOOD BANK

## 2021-07-03 LAB — LACTATE DEHYDROGENASE: LDH: 119 U/L (ref 98–192)

## 2021-07-03 LAB — FERRITIN: Ferritin: 247 ng/mL (ref 24–336)

## 2021-07-03 LAB — VITAMIN B12: Vitamin B-12: 1391 pg/mL — ABNORMAL HIGH (ref 180–914)

## 2021-07-03 LAB — PREPARE RBC (CROSSMATCH)

## 2021-07-03 MED ORDER — CYANOCOBALAMIN 1000 MCG/ML IJ SOLN
1000.0000 ug | Freq: Once | INTRAMUSCULAR | Status: AC
  Administered 2021-07-03: 1000 ug via INTRAMUSCULAR
  Filled 2021-07-03: qty 1

## 2021-07-03 MED ORDER — SODIUM CHLORIDE 0.9% IV SOLUTION
250.0000 mL | Freq: Once | INTRAVENOUS | Status: AC
  Administered 2021-07-03: 250 mL via INTRAVENOUS

## 2021-07-03 MED ORDER — DIPHENHYDRAMINE HCL 25 MG PO CAPS
25.0000 mg | ORAL_CAPSULE | Freq: Once | ORAL | Status: AC
  Administered 2021-07-03: 25 mg via ORAL
  Filled 2021-07-03: qty 1

## 2021-07-03 MED ORDER — ACETAMINOPHEN 325 MG PO TABS
650.0000 mg | ORAL_TABLET | Freq: Once | ORAL | Status: AC
  Administered 2021-07-03: 650 mg via ORAL
  Filled 2021-07-03: qty 2

## 2021-07-03 NOTE — Patient Instructions (Signed)
[facility]  Discharge Instructions: Thank you for choosing [facility] to provide your oncology and hematology care.  If you have a lab appointment with the [facility], please come in thru the Main Entrance and check in at the main information desk.  Wear comfortable clothing and clothing appropriate for easy access to any Portacath or PICC line.   We strive to give you quality time with your provider. You may need to reschedule your appointment if you arrive late (15 or more minutes).  Arriving late affects you and other patients whose appointments are after yours.  Also, if you miss three or more appointments without notifying the office, you may be dismissed from the clinic at the providers discretion.      For prescription refill requests, have your pharmacy contact our office and allow 72 hours for refills to be completed.    Today you received the following 2 units of blood.       To help prevent nausea and vomiting after your treatment, we encourage you to take your nausea medication as directed.  BELOW ARE SYMPTOMS THAT SHOULD BE REPORTED IMMEDIATELY: *FEVER GREATER THAN 100.4 F (38 C) OR HIGHER *CHILLS OR SWEATING *NAUSEA AND VOMITING THAT IS NOT CONTROLLED WITH YOUR NAUSEA MEDICATION *UNUSUAL SHORTNESS OF BREATH *UNUSUAL BRUISING OR BLEEDING *URINARY PROBLEMS (pain or burning when urinating, or frequent urination) *BOWEL PROBLEMS (unusual diarrhea, constipation, pain near the anus) TENDERNESS IN MOUTH AND THROAT WITH OR WITHOUT PRESENCE OF ULCERS (sore throat, sores in mouth, or a toothache) UNUSUAL RASH, SWELLING OR PAIN  UNUSUAL VAGINAL DISCHARGE OR ITCHING   Items with * indicate a potential emergency and should be followed up as soon as possible or go to the Emergency Department if any problems should occur.  Please show the CHEMOTHERAPY ALERT CARD or IMMUNOTHERAPY ALERT CARD at check-in to the Emergency Department and triage nurse.  Should  you have questions after your visit or need to cancel or reschedule your appointment, please contact [facility] [phone]  and follow the prompts.  Office hours are 8:00 a.m. to 4:30 p.m. Monday - Friday. Please note that voicemails left after 4:00 p.m. may not be returned until the following business day.  We are closed weekends and major holidays. You have access to a nurse at all times for urgent questions. Please call the main number to the clinic [phone] and follow the prompts.  For any non-urgent questions, you may also contact your provider using MyChart. We now offer e-Visits for anyone 69 and older to request care online for non-urgent symptoms. For details visit [email or web address].   Also download the MyChart app! Go to the app store, search "MyChart", open the app, select [facility], and log in with your MyChart username and password.  Due to Covid, a mask is required upon entering the hospital/clinic. If you do not have a mask, one will be given to you upon arrival. For doctor visits, patients may have 1 support person aged 10 or older with them. For treatment visits, patients cannot have anyone with them due to current Covid guidelines and our immunocompromised population.

## 2021-07-03 NOTE — Patient Instructions (Signed)
Visit Information  Thank you for taking time to visit with me today. Please don't hesitate to contact me if I can be of assistance to you before our next scheduled telephone appointment.  Following are the goals we discussed today:  Current Barriers:  Knowledge Deficits related to plan of care for management of DMII   RNCM Clinical Goal(s):  Patient will verbalize understanding of plan for management of DMII as evidenced by continuation of monitoring blood sugars and adhering to diabetic diet through collaboration with RN Care manager, provider, and care team.   Interventions: Inter-disciplinary care team collaboration (see longitudinal plan of care) Evaluation of current treatment plan related to  self management and patient's adherence to plan as established by provider   Diabetes Interventions:  (Status:  Goal on track:  Yes.) Long Term Goal Assessed patient's understanding of A1c goal: <6.5% Provided education to patient about basic DM disease process Provided patient with written educational materials related to hypo and hyperglycemia and importance of correct treatment Advised patient, providing education and rationale, to check cbg 4 x a day and record, calling PCP  for findings outside established parameters Lab Results  Component Value Date   HGBA1C 6.1 (H) 04/10/2021    Patient Goals/Self-Care Activities: Take all medications as prescribed Attend all scheduled provider appointments Call pharmacy for medication refills 3-7 days in advance of running out of medications Perform all self care activities independently  Perform IADL's (shopping, preparing meals, housekeeping, managing finances) independently Call provider office for new concerns or questions  call the Suicide and Crisis Lifeline: 988 if experiencing a Mental Health or [place]  schedule appointment with eye doctor check blood sugar at prescribed times: 4 times daily check feet daily for cuts,  sores or redness take the blood sugar log to all doctor visits trim toenails straight across manage portion size wash and dry feet carefully every day wear comfortable, cotton socks wear comfortable, well-fitting shoes  Follow Up Plan:  Telephone follow up appointment with care management team member scheduled for:  May 2023. The patient has been provided with contact information for the care management team and has been advised to call with any health related questions or concerns.    Our next appointment is by telephone on May 2023  Please call [name] RN at [phone] if you need to cancel or reschedule your appointment.   Please call the Suicide and Crisis Lifeline: 988 if you are experiencing a Mental Health or [place] or need someone to talk to.  The patient verbalized understanding of instructions, educational materials, and care plan provided today and agreed to receive a mailed copy of patient instructions, educational materials, and care plan.   Telephone follow up appointment with care management team member scheduled for: The patient has been provided with contact information for the care management team and has been advised to call with any health related questions or concerns.   [facility] [phone]

## 2021-07-03 NOTE — Progress Notes (Signed)
CRITICAL VALUE ALERT Critical value received:  HGB 6.9 Date of notification:  07/03/2021 Time of notification: 11:54 am  Critical value read back:  Yes.   Nurse who received alert:  B. Rider Ermis RN MD notified time and response:  R. Pennington PA. Patient will receive 2 Units of blood products today.  Patient seen by R. Pennignton PA and hgb 6.9. Patient presents today for 2  units of PRBC's. Vitals signs stable. Patient has no complaints of any changes since his last visit. MAR reviewed and updated.    2 Units of blood given today per MD orders. Tolerated infusion without adverse affects. Vital signs stable. No complaints at this time. Discharged from clinic by wheel chair  in stable condition. Alert and oriented x 3. F/U with [facility] as scheduled.

## 2021-07-03 NOTE — Progress Notes (Signed)
Jeffrey [name] presents today for B12 injection per the provider's orders.  Stable during administration without incident; injection site WNL; see MAR for injection details.  Patient tolerated procedure well and without incident.  No questions or complaints noted at this time. Discharge from clinic Via wheelchair in stable condition.  Alert and oriented X 3.  Follow up with [facility] as scheduled.

## 2021-07-03 NOTE — Patient Outreach (Signed)
[facility]) Care Management [place] Note   07/03/2021 Name:  Jeffrey [name] MRN:  [id number] DOB:  07-Apr-1946  Summary: RN spoke with [name] wife. Fasting blood sugar 154. A1C 5.8. Patient is at Dr office receiving 2 units of blood. Per patient the his blood pressure at home has been good. Patient has not had any recent falls. He has skin tears.   Recommendations/Changes made from today's visit: Continue to monitor blood sugars Continue to monitor blood pressure    Subjective: Jeffrey [name] is an 76 y.o. year old male who is a primary patient of Monico Blitz, MD. The care management team was consulted for assistance with care management and/or care coordination needs.    RN Health Coach completed Telephone Visit today.   Objective:  Medications Reviewed Today     Reviewed by Candis Musa, LPN (Licensed Practical Nurse) on 07/03/21 at 1033  Med List Status: <None>   Medication Order Taking? Sig Documenting Provider Last Dose Status Informant  acetaminophen (TYLENOL) 650 MG CR tablet [id number] Yes Take 650 mg by mouth every 8 (eight) hours as needed for pain. [provider] Taking Active Spouse/Significant Other  Alcohol Swabs (ALCOHOL PREP) 70 % PADS [id number] Yes Apply 1 each topically 3 (three) times daily. [provider] Taking Active Spouse/Significant Other  ALPRAZolam (XANAX) 0.5 MG tablet [id number] Yes Take 0.5 mg by mouth See admin instructions. Take one tablet (0.5 mg) by mouth on Monday, Wednesday, Friday before dialysis, may also take one tablet (0.5 mg) daily as needed for anxiety. [provider] Taking Active Spouse/Significant Other  Ascorbic Acid (VITAMIN C) 1000 MG tablet [id number] Yes Take 1,000 mg by mouth in the morning. [provider] Taking Active Spouse/Significant Other  BD PEN NEEDLE MICRO U/F 32G X 6 MM MISC [id number] Yes SMARTSIG:SUB-Q Daily PRN [provider] Taking Active  Spouse/Significant Other  calcium carbonate (TUMS - DOSED IN MG ELEMENTAL CALCIUM) 500 MG chewable tablet [id number] Yes Chew 1 tablet by mouth 3 (three) times daily with meals. [provider] Taking Active Spouse/Significant Other  Calcium Carbonate-Simethicone (ALKA-SELTZER HEARTBURN + GAS) 750-80 MG CHEW [id number] Yes Chew 1-2 tablets by mouth 2 (two) times daily as needed (acid reflux). [provider] Taking Active Spouse/Significant Other  cetirizine (ZYRTEC) 10 MG tablet [id number] Yes Take 10 mg by mouth daily. [provider] Taking Active   cholecalciferol (VITAMIN D3) 25 MCG (1000 UNIT) tablet [id number] Yes Take 1,000 Units by mouth daily. [provider] Taking Active Spouse/Significant Other  diltiazem (TIAZAC) 120 MG 24 hr capsule [id number] Yes Take 120 mg by mouth daily. [provider] Taking Active Spouse/Significant Other  furosemide (LASIX) 40 MG tablet [id number] Yes Take 80 mg by mouth daily. [provider] Taking Active Spouse/Significant Other  gabapentin (NEURONTIN) 100 MG capsule [id number] Yes TAKE 1 CAPSULE BY MOUTH AT BEDTIME  Patient taking differently: Take 100 mg by mouth 3 (three) times daily.   Waynetta Sandy, MD Taking Active Spouse/Significant Other  Garlic 6734 MG CAPS [id number] Yes Take 1,000 mg by mouth daily. [provider] Taking Active Spouse/Significant Other  GLUCAGEN HYPOKIT 1 MG SOLR injection [id number] Yes Inject 1 mg into the muscle once as needed for low blood sugar. [provider] Taking Active Spouse/Significant Other  HUMALOG KWIKPEN 100 UNIT/ML KwikPen [id number] Yes 10-50 Units 3 (three) times daily. Sliding scale [provider] Taking Active Spouse/Significant Other  hydrocortisone (ANUSOL-HC) 2.5 % rectal cream [id number] Yes Place  1 application rectally 2 (two) times daily as needed for hemorrhoids or anal itching. Murlean Iba, MD Taking Active  Spouse/Significant Other  hydrOXYzine (ATARAX/VISTARIL) 25 MG tablet [id number] Yes Take 25 mg by mouth 3 (three) times daily. For itching [provider] Taking Active Spouse/Significant Other  iron polysaccharides (NIFEREX) 150 MG capsule [id number] Yes Take 150 mg by mouth daily with supper. [provider] Taking Active Spouse/Significant Other           Med Note Wilmon Pali, MELISSA R   Wed May 14, 2021  2:53 PM)    lactulose (CONSTULOSE) 10 GM/15ML solution [id number] Yes TAKE 45 MLS EVERY MORNING AND TAKE 45 MLS EVERY EVENING  Patient taking differently: Take 30 g by mouth See admin instructions. Take 45 mls (30 g) by mouth on Sunday, Tuesday, Thursday, Saturday mornings (non-dialysis days) unless going to MD appointment; take 45 mls (30 g) daily at bedtime   Mahala Menghini, PA-C Taking Active Spouse/Significant Other  levofloxacin (LEVAQUIN) 500 MG tablet [id number] Yes TAKE 1 TABLET BY MOUTH THREE TIMES EVERY WEEK AFTER DIALYSIS Erenest Rasher, PA-C Taking Active Spouse/Significant Other  lidocaine-prilocaine (EMLA) cream [id number] No Apply 2 application topically daily.  Patient not taking: Reported on 07/03/2021   [provider] Not Taking Active   metoprolol succinate (TOPROL-XL) 25 MG 24 hr tablet [id number] Yes Take 1 tablet (25 mg total) by mouth See admin instructions. Take one tablet (25 mg) by mouth at bedtime every night and take one tablet (25 mg) in morning on  Sun, Tues, Thurs and Sat (non-dialysis days) Murlean Iba, MD Taking Active Spouse/Significant Other  midodrine (PROAMATINE) 10 MG tablet [id number] Yes Take 1 tablet (10 mg total) by mouth 2 (two) times daily with a meal. Tat, David, MD Taking Active Spouse/Significant Other  milk thistle 175 MG tablet [id number] Yes Take 175 mg by mouth 3 (three) times daily.  [provider] Taking Active Spouse/Significant Other  mirtazapine (REMERON) 30 MG tablet [id number] Yes Take 1 tablet (30 mg  total) by mouth at bedtime.  Patient taking differently: Take 30 mg by mouth daily with supper.   Roxan Hockey, MD Taking Active Spouse/Significant Other  Multiple Vitamin (MULTIVITAMIN WITH MINERALS) TABS tablet [id number] Yes Take 1 tablet by mouth in the morning.  [provider] Taking Active Spouse/Significant Other  omega-3 acid ethyl esters (LOVAZA) 1 g capsule [id number] Yes Take 2 g by mouth 2 (two) times daily.  [provider] Taking Active Spouse/Significant Other  ondansetron (ZOFRAN) 4 MG tablet [id number] Yes Take 4 mg by mouth every 8 (eight) hours as needed for nausea or vomiting. [provider] Taking Active Spouse/Significant Other           Med Note Nat Christen   Fri Mar 29, 2020  8:44 AM)    oxyCODONE-acetaminophen (PERCOCET/ROXICET) 5-325 MG tablet [id number] Yes Take 1 tablet by mouth every 4 (four) hours as needed for moderate pain. [provider] Taking Active Spouse/Significant Other  pantoprazole (PROTONIX) 40 MG tablet [id number] Yes Take 2 tablets (80 mg total) by mouth daily. Shelly Coss, MD Taking Active Spouse/Significant Other  Ped Vitamins ACD Fl-Iron (TRI-VIT/FLUORIDE/IRON PO) [id number] Yes Take 1 tablet by mouth daily. [provider] Taking Active Spouse/Significant Other           Med Note Wilmon Pali, MELISSA R   Wed May 14, 2021  2:58 PM) [facility]   pravastatin (PRAVACHOL) 20 MG tablet [id number] Yes Take 20 mg by mouth in  the morning.  [provider] Taking Active Spouse/Significant Other  Probiotic Product (PROBIOTIC DAILY PO) [id number] Yes Take 1 tablet by mouth in the morning.  [provider] Taking Active Spouse/Significant Other  spironolactone (ALDACTONE) 50 MG tablet [id number] Yes Take 50 mg by mouth in the morning.  [provider] Taking Active Spouse/Significant Other  thiamine (VITAMIN B-1) 100 MG tablet [id number] Yes Take 100 mg by mouth daily. [provider] Taking Active Spouse/Significant Other  tiZANidine (ZANAFLEX) 2 MG tablet [id number] Yes Take 2 mg by mouth 2 (two) times daily as needed for muscle spasms. [provider] Taking Active Spouse/Significant Other  TRESIBA FLEXTOUCH 200 UNIT/ML FlexTouch Pen [id number] Yes Inject 120 Units into the skin at bedtime. [provider] Taking Active Spouse/Significant Other  triamcinolone cream (KENALOG) 0.1 % [id number] Yes Apply 1 application topically 2 (two) times daily as needed (dry skin Itching). [provider] Taking Active Spouse/Significant Other  venlafaxine (EFFEXOR) 75 MG tablet [id number] Yes Take 75 mg by mouth 3 (three) times daily with meals.  [provider] Taking Active Spouse/Significant Other  XIFAXAN 550 MG TABS tablet [id number] Yes TAKE 1 TABLET BY MOUTH TWICE DAILY Mahala Menghini, PA-C Taking Active Spouse/Significant Other  Med List Note Nat Christen, CPhT 01/10/21 1211): Dialysis on Monday, Wednesday and Friday             SDOH:  (Social Determinants of Health) assessments and interventions performed:  SDOH Interventions    Flowsheet Row Most Recent Value  SDOH Interventions   Food Insecurity Interventions Intervention Not Indicated  Housing Interventions Intervention Not Indicated  Transportation Interventions Intervention Not Indicated       Care Plan  Review of patient past medical history, allergies, medications, health status, including review of consultants reports, laboratory and other test data, was performed as part of comprehensive evaluation for care management services.   Care Plan : Diabetes Type 2 (Adult)  Updates made by Verlin Grills, RN since 07/03/2021 12:00 AM     Problem: Glycemic Management (Diabetes, Type 2) Resolved 07/03/2021  Priority: Medium  Note:   [id number] Resolving due to duplicate goal     Long-Range Goal: Glycemic Management Optimized Completed 07/03/2021  Start Date:  05/30/2022  Expected End Date: 05/30/2022  Recent Progress: On track  Priority: Medium  Note:   Evidence-based guidance:  Anticipate A1C testing (point-of-care) every 3 to 6 months based on goal attainment.  Review mutually-set A1C goal or target range.  Anticipate use of antihyperglycemic with or without insulin and periodic adjustments; consider active involvement of pharmacist.  Provide medical nutrition therapy and development of individualized eating.  Compare self-reported symptoms of hypo or hyperglycemia to blood glucose levels, diet and fluid intake, current medications, psychosocial and physiologic stressors, change in activity and barriers to care adherence.  Promote self-monitoring of blood glucose levels.  Assess and address barriers to management plan, such as food insecurity, age, developmental ability, depression, anxiety, fear of hypoglycemia or weight gain, as well as medication cost, side effects and complicated regimen.  Consider referral to community-based diabetes education program, visiting nurse, community health worker or health coach.  Encourage regular dental care for treatment of periodontal disease; refer to dental provider when needed.   Notes:  [id number] Patient A1C is 5.9. Patient has a free style Libre and monitoring blood sugars frequently. RN notified Dr office of patient wanting to continue physical therapy    Task: Alleviate Barriers to Glycemic Management Completed 07/03/2021  Due  Date: 07/01/2021  Priority: Routine  Note:   Care Management Activities:    - barriers to adherence to treatment plan identified - blood glucose monitoring encouraged - blood glucose readings reviewed - mutual A1C goal set or reviewed - self-awareness of signs/symptoms of hypo or hyperglycemia encouraged - use of blood glucose monitoring log promoted    Notes:  10/20-Pt managing his ongoing blood glucose levels with healthy eating habits and support from his spouse [name].  No reported issues or events over the last month. 9/22-Pt reports his CBG ranges over the last month 180-200 with no recent changes in his medications. Pt last A1c was 5.9 last month as pt continue to do well in managing his glycemic readings. Pt states he is really trying to stay adherent to attending all dialysis days and manage his care. 7/28-Pt reports extra fluid was removed today 2.5 liters after visiting Dr. Raul Del office. Pt will continue his usual days starting back tomorrow (MWF). Pt received his new Libre monitoring for closer monitoring. 4/19 Pt continues to well with good glucose readings via HD 3 X weekly with no acute events related to hyperglycemia or hypoglycemia. Spouse reports ongoing adherence as pt continues to manage his care. Denies any related symptoms as pt has attended all medical appointments and requested labwork via [name]. Will continue to encouraged adherence,.    Problem: Disease Progression (Diabetes, Type 2) Resolved 07/03/2021  Priority: Medium  Note:   [id number] Resolving due to duplicate goal      Goal: Disease Progression Prevented or Minimized Completed 07/03/2021  Start Date: 06/20/2020  Expected End Date: 07/01/2021  Recent Progress: On track  Priority: Medium  Note:   Evidence-based guidance:  Prepare patient for laboratory and diagnostic exams based on risk and presentation.  Encourage lifestyle changes, such as increased intake of plant-based foods, stress reduction, consistent physical activity and smoking cessation to prevent long-term complications and chronic disease.   Individualize activity and exercise recommendations while considering potential limitations, such as neuropathy, retinopathy or the ability to prevent hyperglycemia or hypoglycemia.   Prepare patient for use of pharmacologic therapy that may include antihypertensive, analgesic, prostaglandin E1 with periodic adjustments, based on presenting chronic condition and laboratory results.   Assess signs/symptoms and risk factors for hypertension, sleep-disordered breathing, neuropathy (including changes in gait and balance), retinopathy, nephropathy and sexual dysfunction.  Address pregnancy planning and contraceptive choice, especially when prescribing antihypertensive or statin.  Ensure completion of annual comprehensive foot exam and dilated eye exam.   Implement additional individualized goals and interventions based on identified risk factors.  Prepare patient for consultation or referral for specialist care, such as ophthalmology, neurology, cardiology, podiatry, nephrology or perinatology.   Notes:  [id number] Patient has received flu and COVID vaccine    Task: Monitor and Manage Follow-up for Comorbidities Completed 07/03/2021  Due Date: 05/30/2022  Priority: Routine  Note:   Care Management Activities:    - completion of annual dilated eye exam confirmed - completion of annual foot exam verified - healthy lifestyle promoted - reduction of sedentary activity encouraged - signs/symptoms of comorbidities identified - vital signs and trends reviewed    Notes:  10/20-Pt continue to manage all other comorbidities with no encountered issues. States his recent weights are controlled with dialysis maintaining weights around 204 lbs with no symptoms of HF. A1C and CBG are also improved. 9/22-Spoke with the pt today and received an update on pt's ongoing management of care. Pt reports no additional GI bleed symptoms as  he continue to manage this condition with follow up appointments and following all recommendations to prevent acute bleed once again. Verified pt is managing all his comorbidities to avoid returning to the hospital. 8/23-Spoke with caregiver spouse today [name] who indicates recent hospitalization due to GI bleed. States it was a result of a colonoscopy that continue to bleed until cauterized. Pt feeling much better and will resume his regular DH scheduled starting  tomorrow. No other issues or needs at this time.   7/28-Pt continue to manage his ongoing medical issues with assistance from his wife. No acute issues from any other medical condition reported at this time. Pt tolerating all therapy and recommended treatments at this time. 5/23-Spoke with spouse today primary caregiver who indicates pt is tolerating all therapeutic treatments along with his weekly dialysis. Controlling all comorbidities with assistance from [employer] diabetic device now not being covered vis insurance carrier. Offered to assist if further needs arise with [place]. 4/19- Pt continues to be adherent with maintain his therapies and attendance to all his medical appointments in managing his comorbidities and spouse has denied any signs or symptoms related. Will continue to offer other disciplines via [facility] services (none needed at this time).  3/22- HD 3 X weekly and attendance to all appointments. [facility] today for fluid retention removal. Spouse ([name]) feel pt may benefit from additional HHPT. Encouraged to make this request with [name] (receptive and she will inquire). No falls uses [facility]. Reports AM glucose at 140 and maintaining this average. Will continue to encouraged adherence and praised pt for his efforts in maintaining his health and adhering to all goals and interventions.  2/22-Pt continues to do well with no sign or symptoms related. Pt continue to attempt healthy eating habits similar to the Keto plan. Pt not able to exercise much however encouraged as tolerated. 1/20-Spouse reports several appointments were rescheduled due to incremental weather but verified. Pt missed two dialysis appointment however was able to re-stabilize fluid removed via dialysis on another day. Stress the importance of attending dialysis to avoid increase risk factors that may lead to hospitalizations.    Care Plan : RN Care Manager Plan of Care  Updates made by  Shalece Staffa, Eppie Gibson, RN since 07/03/2021 12:00 AM     Problem: Knowledge Deficit Related to Diabetes and Care Coordination Needs   Priority: High     Long-Range Goal: Development Plan of Care for Management of Diabetes   Start Date: 07/03/2021  Expected End Date: 05/30/2022  Priority: High  Note:   Current Barriers:  Knowledge Deficits related to plan of care for management of DMII   RNCM Clinical Goal(s):  Patient will verbalize understanding of plan for management of DMII as evidenced by continuation of monitoring blood sugars and adhering to diabetic diet  through collaboration with RN Care manager, provider, and care team.   Interventions: Inter-disciplinary care team collaboration (see longitudinal plan of care) Evaluation of current treatment plan related to  self management and patient's adherence to plan as established by provider   Diabetes Interventions:  (Status:  Goal on track:  Yes.) Long Term Goal Assessed patient's understanding of A1c goal: <6.5% Provided education to patient about basic DM disease process Provided patient with written educational materials related to hypo and hyperglycemia and importance of correct treatment Advised patient, providing education and rationale, to check cbg 4 x a day and record, calling PCP  for findings outside established parameters Lab Results  Component Value  Date   HGBA1C 6.1 (H) 04/10/2021   Patient Goals/Self-Care Activities: Take all medications as prescribed Attend all scheduled provider appointments Call pharmacy for medication refills 3-7 days in advance of running out of medications Perform all self care activities independently  Perform IADL's (shopping, preparing meals, housekeeping, managing finances) independently Call provider office for new concerns or questions  call the Suicide and Crisis Lifeline: 988 if experiencing a Mental Health or [place]  schedule appointment with eye doctor check blood  sugar at prescribed times: 4 times daily check feet daily for cuts, sores or redness take the blood sugar log to all doctor visits trim toenails straight across manage portion size wash and dry feet carefully every day wear comfortable, cotton socks wear comfortable, well-fitting shoes  Follow Up Plan:  Telephone follow up appointment with care management team member scheduled for:  May 2023. The patient has been provided with contact information for the care management team and has been advised to call with any health related questions or concerns.        Plan: Telephone follow up appointment with care management team member scheduled for:  May 2023 The patient has been provided with contact information for the care management team and has been advised to call with any health related questions or concerns.   [facility] [phone]

## 2021-07-03 NOTE — Patient Instructions (Signed)
[facility]  Discharge Instructions: Thank you for choosing [facility] to provide your oncology and hematology care.  If you have a lab appointment with the Cancer Center, please come in thru the Main Entrance and check in at the main information desk.  Wear comfortable clothing and clothing appropriate for easy access to any Portacath or PICC line.   We strive to give you quality time with your provider. You may need to reschedule your appointment if you arrive late (15 or more minutes).  Arriving late affects you and other patients whose appointments are after yours.  Also, if you miss three or more appointments without notifying the office, you may be dismissed from the clinic at the provider's discretion.      For prescription refill requests, have your pharmacy contact our office and allow 72 hours for refills to be completed.    Today you received the following chemotherapy and/or immunotherapy agents B12      To help prevent nausea and vomiting after your treatment, we encourage you to take your nausea medication as directed.  BELOW ARE SYMPTOMS THAT SHOULD BE REPORTED IMMEDIATELY: *FEVER GREATER THAN 100.4 F (38 C) OR HIGHER *CHILLS OR SWEATING *NAUSEA AND VOMITING THAT IS NOT CONTROLLED WITH YOUR NAUSEA MEDICATION *UNUSUAL SHORTNESS OF BREATH *UNUSUAL BRUISING OR BLEEDING *URINARY PROBLEMS (pain or burning when urinating, or frequent urination) *BOWEL PROBLEMS (unusual diarrhea, constipation, pain near the anus) TENDERNESS IN MOUTH AND THROAT WITH OR WITHOUT PRESENCE OF ULCERS (sore throat, sores in mouth, or a toothache) UNUSUAL RASH, SWELLING OR PAIN  UNUSUAL VAGINAL DISCHARGE OR ITCHING   Items with * indicate a potential emergency and should be followed up as soon as possible or go to the Emergency Department if any problems should occur.  Please show the CHEMOTHERAPY ALERT CARD or IMMUNOTHERAPY ALERT CARD at check-in to the Emergency Department  and triage nurse.  Should you have questions after your visit or need to cancel or reschedule your appointment, please contact [facility] [phone]  and follow the prompts.  Office hours are 8:00 a.m. to 4:30 p.m. Monday - Friday. Please note that voicemails left after 4:00 p.m. may not be returned until the following business day.  We are closed weekends and major holidays. You have access to a nurse at all times for urgent questions. Please call the main number to the clinic [phone] and follow the prompts.  For any non-urgent questions, you may also contact your provider using MyChart. We now offer e-Visits for anyone 18 and older to request care online for non-urgent symptoms. For details visit [email or web address].   Also download the MyChart app! Go to the app store, search "MyChart", open the app, select [place], and log in with your MyChart username and password.  Due to Covid, a mask is required upon entering the hospital/clinic. If you do not have a mask, one will be given to you upon arrival. For doctor visits, patients may have 1 support person aged 18 or older with them. For treatment visits, patients cannot have anyone with them due to current Covid guidelines and our immunocompromised population.  

## 2021-07-03 NOTE — Patient Instructions (Signed)
[facility] Discharge Instructions  You were seen today by Tarri Abernethy PA-C for your low blood counts.  As we have discussed, your low white blood cells and platelets is related to your liver disease.  Your white blood cells and platelets remain stable within their baseline range.  Unless there are any large changes, we will not need to actively treat your low white blood cells or low platelets.  We will continue to monitor them though with repeat labs.  Your blood count (hemoglobin) was 6.9 today, so you were given 2 units of blood transfusion during your visit.  Continue to receive Epogen during dialysis.  Continue to follow-up with your GI doctor for your ongoing risks of blood loss, and seek immediate medical attention if you notice any further GI bleeding such as bright red blood in the toilet or black tarry bowel movements.  We will recheck your blood count in 2 weeks.  If it is improved at that time, we will go back to checking it once a month.    Continue monthly B12 injections.  Follow-up visit in 3 months, or sooner if needed.    Thank you for choosing [facility] to provide your oncology and hematology care.  To afford each patient quality time with our provider, please arrive at least 15 minutes before your scheduled appointment time.   If you have a lab appointment with the [facility] please come in thru the Main Entrance and check in at the main information desk.  You need to re-schedule your appointment should you arrive 10 or more minutes late.  We strive to give you quality time with our providers, and arriving late affects you and other patients whose appointments are after yours.  Also, if you no show three or more times for appointments you may be dismissed from the clinic at the providers discretion.     Again, thank you for choosing [facility].  Our hope is that these  requests will decrease the amount of time that you wait before being seen by our physicians.       _____________________________________________________________  Should you have questions after your visit to [facility], please contact our office at [phone] and follow the prompts.  Our office hours are 8:00 a.m. and 4:30 p.m. Monday - Friday.  Please note that voicemails left after 4:00 p.m. may not be returned until the following business day.  We are closed weekends and major holidays.  You do have access to a nurse 24-7, just call the main number to the clinic [phone] and do not press any options, hold on the line and a nurse will answer the phone.    For prescription refill requests, have your pharmacy contact our office and allow 72 hours.    Due to Covid, you will need to wear a mask upon entering the hospital. If you do not have a mask, a mask will be given to you at the Main Entrance upon arrival. For doctor visits, patients may have 1 support person age 46 or older with them. For treatment visits, patients can not have anyone with them due to social distancing guidelines and our immunocompromised population.

## 2021-07-04 LAB — TYPE AND SCREEN
ABO/RH(D): O POS
Antibody Screen: NEGATIVE
Unit division: 0
Unit division: 0

## 2021-07-04 LAB — BPAM RBC
Blood Product Expiration Date: 202303082359
Blood Product Expiration Date: 202303082359
ISSUE DATE / TIME: 202302021338
ISSUE DATE / TIME: 202302021518
Unit Type and Rh: 5100
Unit Type and Rh: 5100

## 2021-07-07 ENCOUNTER — Encounter (HOSPITAL_COMMUNITY): Payer: Self-pay | Admitting: *Deleted

## 2021-07-07 ENCOUNTER — Emergency Department (HOSPITAL_COMMUNITY): Payer: Medicare Other

## 2021-07-07 ENCOUNTER — Emergency Department (HOSPITAL_COMMUNITY)
Admission: EM | Admit: 2021-07-07 | Discharge: 2021-07-07 | Disposition: A | Discharge status: Home / Self Care | Payer: Medicare Other | Attending: Emergency Medicine | Admitting: Emergency Medicine

## 2021-07-07 DIAGNOSIS — S40022A Contusion of left upper arm, initial encounter: Secondary | ICD-10-CM | POA: Diagnosis not present

## 2021-07-07 DIAGNOSIS — I129 Hypertensive chronic kidney disease with stage 1 through stage 4 chronic kidney disease, or unspecified chronic kidney disease: Secondary | ICD-10-CM | POA: Insufficient documentation

## 2021-07-07 DIAGNOSIS — S8010XA Contusion of unspecified lower leg, initial encounter: Secondary | ICD-10-CM | POA: Insufficient documentation

## 2021-07-07 DIAGNOSIS — N189 Chronic kidney disease, unspecified: Secondary | ICD-10-CM | POA: Diagnosis not present

## 2021-07-07 DIAGNOSIS — M7989 Other specified soft tissue disorders: Secondary | ICD-10-CM

## 2021-07-07 DIAGNOSIS — Z992 Dependence on renal dialysis: Secondary | ICD-10-CM | POA: Insufficient documentation

## 2021-07-07 DIAGNOSIS — E1122 Type 2 diabetes mellitus with diabetic chronic kidney disease: Secondary | ICD-10-CM | POA: Insufficient documentation

## 2021-07-07 DIAGNOSIS — X58XXXA Exposure to other specified factors, initial encounter: Secondary | ICD-10-CM | POA: Diagnosis not present

## 2021-07-07 DIAGNOSIS — S4992XA Unspecified injury of left shoulder and upper arm, initial encounter: Secondary | ICD-10-CM | POA: Diagnosis present

## 2021-07-07 LAB — METHYLMALONIC ACID, SERUM: Methylmalonic Acid, Quantitative: 537 nmol/L — ABNORMAL HIGH (ref 0–378)

## 2021-07-07 NOTE — ED Triage Notes (Signed)
Sent from dialysis for evaluation of graft in left arm. Area is swollen and blue in color

## 2021-07-07 NOTE — ED Provider Notes (Signed)
Emergency Department Provider Note   I have reviewed the triage vital signs and the nursing notes.   HISTORY  Chief Complaint Arm Swelling   HPI Jeffrey [name] is a 76 y.o. male past medical history of A-fib, alcohol cirrhosis, dialysis presents with bruising in multiple locations which is not new but was sent here from dialysis with concern for bruising around his left upper arm fistula.  He went to dialysis today and they would not access his fistula to run his routine dialysis because of the bruising.  He last had dialysis on Friday.  He is not having any pain in the arm.  No numbness or weakness.  He states that he bruises very easily due to his underlying issues and points out multiple bruises in other locations.  Denies any fevers.  No abdominal discomfort or chest pain.  He was sent here for evaluation of the fistula.    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 difficile colitis    Cholelithiasis    Chronic kidney disease    Dialysis M/W/F/Sa   Depression    Diabetes ([place])    Duodenal ulcer with hemorrhage    per patient in 2006 or 2007, records have been requested   Dyspnea    low oxygen level -  has O2 2 L all day   Esophageal varices ([place])    see [facility]   GERD (gastroesophageal reflux disease)    Heart burn    History of alcohol abuse    quit 05/2013   HOH (hard of hearing)    Hypertension    Liver cirrhosis ([place])    ETOH   SBP (spontaneous bacterial peritonitis) ([place])    2020, November 2021.    Splenomegaly    Ultrasound in 2006    Review of Systems  Constitutional: No fever/chills Eyes: No  visual changes. ENT: No sore throat. Cardiovascular: Denies chest pain. Respiratory: Denies shortness of breath. Gastrointestinal: No abdominal pain.  No nausea, no vomiting.  No diarrhea.  No constipation. Genitourinary: Negative for dysuria. Musculoskeletal: Negative for back pain. Skin: Negative for rash. Bruising around the fistula of the left arm.  Neurological: Negative for headaches, focal weakness or numbness.   ____________________________________________   PHYSICAL EXAM:  VITAL SIGNS: ED Triage Vitals  Enc Vitals Group     BP 07/07/21 1511 138/60     Pulse Rate 07/07/21 1511 65     Resp 07/07/21 1511 18     Temp 07/07/21 1511 98 F (36.7 C)     Temp Source 07/07/21 1511 Oral     SpO2 07/07/21 1511 92 %   Constitutional: Alert and oriented. Well appearing and in no acute distress. Eyes: Conjunctivae are normal.  Head: Atraumatic. Nose: No congestion/rhinnorhea. Mouth/Throat: Mucous membranes are moist.  Neck: No stridor.  Cardiovascular: Normal rate, regular rhythm. Good peripheral circulation. Grossly normal heart sounds.   Respiratory: Normal respiratory effort.  No retractions. Lungs CTAB. Gastrointestinal: Soft and nontender. No distention.  Musculoskeletal: No lower extremity tenderness nor edema. No  gross deformities of extremities. Neurologic:  Normal speech and language. No gross focal neurologic deficits are appreciated.  Skin:  Skin is warm, dry and intact.  Multiple areas of bruising throughout the upper and lower extremities.  There is some bruising over the left upper arm fistula with palpable thrill. No bleeding.   ____________________________________________   PROCEDURES  Procedure(s) performed:   Procedures  None  ____________________________________________   INITIAL IMPRESSION / ASSESSMENT AND PLAN / ED COURSE  Pertinent labs & imaging results that were available during my care of the patient were reviewed by me and considered in my  medical decision making (see chart for details).   This patient is Presenting for Evaluation of fistula swelling/bruising, which does require a range of treatment options, and is a complaint that involves a high risk of morbidity and mortality.  The Differential Diagnoses include pseudoaneurysm, abscess, hematoma, coagulation disorder.    I did obtain Additional Historical Information from wife at bedside.  I decided to review pertinent External Data, and in summary with fistula placed by Dr. Donnetta Hutching.  Radiologic Tests Ordered, included fistula [place]. I independently interpreted the images and agree with radiology interpretation.   Medical Decision Making: Summary:  Patient presents emergency department with bruising and swelling over his AV fistula on the left.  There is a palpable thrill and his vital signs are within normal limits.  The patient has extensive bruising throughout his body including his fistula but I do not have a strong suspicion for pseudoaneurysm or expanding hematoma.  There is no concern for compartment syndrome on my exam.  Ultrasound reviewed showing patent fistula.  Plan for patient to return for dialysis tomorrow and follow with vascular surgery as needed.  Disposition: discharge  ____________________________________________  FINAL CLINICAL IMPRESSION(S) / ED DIAGNOSES  Final diagnoses:  Left arm swelling     Note:  This document was prepared using Dragon voice recognition software and may include unintentional dictation errors.  Nanda Quinton, MD, [facility] Emergency Medicine    Helyn Schwan, [facility], MD 07/15/21 1540

## 2021-07-07 NOTE — ED Notes (Signed)
ED Provider at bedside. 

## 2021-07-07 NOTE — Discharge Instructions (Signed)
You are seen in the emergency room today with bruising around your dialysis fistula.  The ultrasound shows normal flow.  Please discuss further with your dialysis team to return to your dialysis sessions.

## 2021-07-08 ENCOUNTER — Encounter (HOSPITAL_COMMUNITY): Payer: Self-pay | Admitting: Emergency Medicine

## 2021-07-08 ENCOUNTER — Emergency Department (HOSPITAL_COMMUNITY)
Admission: EM | Admit: 2021-07-08 | Discharge: 2021-07-08 | Disposition: A | Discharge status: Home / Self Care | Payer: Medicare Other | Attending: Emergency Medicine | Admitting: Emergency Medicine

## 2021-07-08 DIAGNOSIS — D649 Anemia, unspecified: Secondary | ICD-10-CM | POA: Insufficient documentation

## 2021-07-08 DIAGNOSIS — Z794 Long term (current) use of insulin: Secondary | ICD-10-CM | POA: Diagnosis not present

## 2021-07-08 DIAGNOSIS — T82898A Other specified complication of vascular prosthetic devices, implants and grafts, initial encounter: Secondary | ICD-10-CM | POA: Diagnosis not present

## 2021-07-08 DIAGNOSIS — N186 End stage renal disease: Secondary | ICD-10-CM | POA: Diagnosis not present

## 2021-07-08 DIAGNOSIS — Z992 Dependence on renal dialysis: Secondary | ICD-10-CM | POA: Diagnosis not present

## 2021-07-08 DIAGNOSIS — Z789 Other specified health status: Secondary | ICD-10-CM

## 2021-07-08 DIAGNOSIS — T82598A Other mechanical complication of other cardiac and vascular devices and implants, initial encounter: Secondary | ICD-10-CM | POA: Diagnosis not present

## 2021-07-08 DIAGNOSIS — D696 Thrombocytopenia, unspecified: Secondary | ICD-10-CM | POA: Diagnosis not present

## 2021-07-08 DIAGNOSIS — Z79899 Other long term (current) drug therapy: Secondary | ICD-10-CM | POA: Diagnosis not present

## 2021-07-08 DIAGNOSIS — Z800 Family history of malignant neoplasm of digestive organs: Secondary | ICD-10-CM | POA: Diagnosis not present

## 2021-07-08 DIAGNOSIS — I4891 Unspecified atrial fibrillation: Secondary | ICD-10-CM | POA: Diagnosis not present

## 2021-07-08 DIAGNOSIS — R7989 Other specified abnormal findings of blood chemistry: Secondary | ICD-10-CM | POA: Diagnosis not present

## 2021-07-08 DIAGNOSIS — Z87891 Personal history of nicotine dependence: Secondary | ICD-10-CM | POA: Diagnosis not present

## 2021-07-08 DIAGNOSIS — Z833 Family history of diabetes mellitus: Secondary | ICD-10-CM

## 2021-07-08 DIAGNOSIS — D72819 Decreased white blood cell count, unspecified: Secondary | ICD-10-CM | POA: Insufficient documentation

## 2021-07-08 DIAGNOSIS — Y712 Prosthetic and other implants, materials and accessory cardiovascular devices associated with adverse incidents: Secondary | ICD-10-CM | POA: Insufficient documentation

## 2021-07-08 DIAGNOSIS — Z803 Family history of malignant neoplasm of breast: Secondary | ICD-10-CM

## 2021-07-08 DIAGNOSIS — E1122 Type 2 diabetes mellitus with diabetic chronic kidney disease: Secondary | ICD-10-CM | POA: Diagnosis not present

## 2021-07-08 DIAGNOSIS — Z823 Family history of stroke: Secondary | ICD-10-CM

## 2021-07-08 LAB — CBC WITH DIFFERENTIAL/PLATELET
Abs Immature Granulocytes: 0.01 10*3/uL (ref 0.00–0.07)
Basophils Absolute: 0 10*3/uL (ref 0.0–0.1)
Basophils Relative: 1 %
Eosinophils Absolute: 0.1 10*3/uL (ref 0.0–0.5)
Eosinophils Relative: 2 %
HCT: 25.9 % — ABNORMAL LOW (ref 39.0–52.0)
Hemoglobin: 8.1 g/dL — ABNORMAL LOW (ref 13.0–17.0)
Immature Granulocytes: 0 %
Lymphocytes Relative: 10 %
Lymphs Abs: 0.3 10*3/uL — ABNORMAL LOW (ref 0.7–4.0)
MCH: 30.9 pg (ref 26.0–34.0)
MCHC: 31.3 g/dL (ref 30.0–36.0)
MCV: 98.9 fL (ref 80.0–100.0)
Monocytes Absolute: 0.2 10*3/uL (ref 0.1–1.0)
Monocytes Relative: 7 %
Neutro Abs: 2.4 10*3/uL (ref 1.7–7.7)
Neutrophils Relative %: 80 %
Platelets: 52 10*3/uL — ABNORMAL LOW (ref 150–400)
RBC: 2.62 MIL/uL — ABNORMAL LOW (ref 4.22–5.81)
RDW: 19.6 % — ABNORMAL HIGH (ref 11.5–15.5)
WBC: 3 10*3/uL — ABNORMAL LOW (ref 4.0–10.5)
nRBC: 0 % (ref 0.0–0.2)

## 2021-07-08 LAB — I-STAT CHEM 8, ED
BUN: 42 mg/dL — ABNORMAL HIGH (ref 8–23)
Calcium, Ion: 1.26 mmol/L (ref 1.15–1.40)
Chloride: 93 mmol/L — ABNORMAL LOW (ref 98–111)
Creatinine, Ser: 5.1 mg/dL — ABNORMAL HIGH (ref 0.61–1.24)
Glucose, Bld: 251 mg/dL — ABNORMAL HIGH (ref 70–99)
HCT: 26 % — ABNORMAL LOW (ref 39.0–52.0)
Hemoglobin: 8.8 g/dL — ABNORMAL LOW (ref 13.0–17.0)
Potassium: 4.4 mmol/L (ref 3.5–5.1)
Sodium: 129 mmol/L — ABNORMAL LOW (ref 135–145)
TCO2: 27 mmol/L (ref 22–32)

## 2021-07-08 LAB — COMPREHENSIVE METABOLIC PANEL
ALT: 13 U/L (ref 0–44)
AST: 19 U/L (ref 15–41)
Albumin: 2.6 g/dL — ABNORMAL LOW (ref 3.5–5.0)
Alkaline Phosphatase: 151 U/L — ABNORMAL HIGH (ref 38–126)
Anion gap: 10 (ref 5–15)
BUN: 43 mg/dL — ABNORMAL HIGH (ref 8–23)
CO2: 24 mmol/L (ref 22–32)
Calcium: 9.2 mg/dL (ref 8.9–10.3)
Chloride: 93 mmol/L — ABNORMAL LOW (ref 98–111)
Creatinine, Ser: 4.72 mg/dL — ABNORMAL HIGH (ref 0.61–1.24)
GFR, Estimated: 12 mL/min — ABNORMAL LOW (ref >60)
Glucose, Bld: 256 mg/dL — ABNORMAL HIGH (ref 70–99)
Potassium: 4.5 mmol/L (ref 3.5–5.1)
Sodium: 127 mmol/L — ABNORMAL LOW (ref 135–145)
Total Bilirubin: 0.7 mg/dL (ref 0.3–1.2)
Total Protein: 6.3 g/dL — ABNORMAL LOW (ref 6.5–8.1)

## 2021-07-08 NOTE — Discharge Instructions (Signed)
You came to the [facility] apartment today to have your fistula evaluated.  You were seen by the vascular surgeon Dr. Donzetta Matters.  You are to follow-up with Dr. Donnetta Hutching who created your fistula in 2 weeks and if that is not possible follow-up with Dr. Donzetta Matters.  You are cleared to return to dialysis.  If you have any issues please contact Dr. Claretha Cooper office.

## 2021-07-08 NOTE — ED Provider Triage Note (Signed)
Emergency Medicine Provider Triage Evaluation Note  Jeffrey [name] , a 76 y.o. male  was evaluated in triage.  Pt complains of hemodialysis vascular access problem.  Patient went to dialysis on Monday and then again today and was unable to receive dialysis as he reports he was told that his fistula did not look great and he need to be evaluated.  Patient was seen in the emergency department yesterday and had an ultrasound of his fistula which shows that it was widely patent.  Patient reports that he last received dialysis on Friday.  Normally receives dialysis Monday, Wednesday, and Friday.  Patient denies any pain associated with his fistula.  Does report increased swelling to fistula site.  Review of Systems  Positive: Swelling at fistula site Negative: Pain  Physical Exam  BP (!) 149/70 (BP Location: Right Arm)    Pulse 77    Temp 99.1 F (37.3 C) (Oral)    Resp 18    SpO2 94%  Gen:   Awake, no distress   Resp:  Normal effort  MSK:   Moves extremities without difficulty; swelling and ecchymosis overlying fistula to left upper extremity.  Fistula has palpable thrill. Other:    Medical Decision Making  Medically screening exam initiated at 1:16 PM.  Appropriate orders placed.  BERTRUM HELMSTETTER was informed that the remainder of the evaluation will be completed by another provider, this initial triage assessment does not replace that evaluation, and the importance of remaining in the ED until their evaluation is complete.  I spoke with Dr. Donzetta Matters with vascular surgery who reports that they will evaluate patient once he is moved back to a room.  Due to missing dialysis we will order basic lab work.   Loni Beckwith, [place] 07/08/21 1318

## 2021-07-08 NOTE — ED Triage Notes (Signed)
Patient referred to ED for evaluation from dialysis. Patient states his dialysis facility told him his fistula did not look normal and he needs to have it evaluated. Patient denies noticing any changes in the appearance of his fistula. Palpable thrill. Patient has no other complaints and is alert, oriented, and in no apparent distress at this time.

## 2021-07-08 NOTE — Consult Note (Signed)
Hospital Consult    Reason for Consult: Concern of left arm AV fistula Referral: [name] MRN #:  [id number]  History of Present Illness: 76 y.o. male history of end-stage renal disease with a two-stage basilic vein fistula in the left with last procedure performed at [facility] April 2021.  Most recently he states that he had a small infiltration event and has subsequently had significant bruising and dark discoloration of the left upper arm.  The fistula has continued to be used.  He was sent to [facility] for evaluation yesterday and duplex was performed.  He again was sent back for evaluation today from his dialysis center.  He is not having any swelling of the upper arm no shortness of breath.  He does have easy bruising of bilateral upper extremities and lower extremities does not take any blood thinners or antiplatelet agents.  Past Medical History:  Diagnosis Date   A-fib [facility])    when initially stating dialysis january 2021 at [facility] per spouse; never heard anything else about it   Alcoholic cirrhosis [facility])    patient reports completing hep a and b vaccines in 2006   Anemia    has had 3 units of prbcs 2013   Anxiety    Arthritis    Asymptomatic gallstones    Ultrasound in 2006   B12 deficiency 11/21/2020   C. difficile colitis    Cholelithiasis    Chronic kidney disease    Dialysis M/W/F/Sa   Depression    Diabetes ([place])    Duodenal ulcer with hemorrhage    per patient in 2006 or 2007, records have been requested   Dyspnea    low oxygen level -  has O2 2 L all day   Esophageal varices ([place])    see [facility]   GERD (gastroesophageal reflux disease)    Heart burn    History of alcohol abuse    quit 05/2013   HOH (hard of hearing)    Hypertension    Liver cirrhosis ([place])    ETOH   SBP (spontaneous bacterial peritonitis) ([place])    2020, November 2021.    Splenomegaly    Ultrasound in 2006    Past Surgical History:  Procedure  Laterality Date   AGILE CAPSULE N/A 09/20/2019   Procedure: AGILE CAPSULE;  Surgeon: Daneil Dolin, MD;  [facility];  Service: Endoscopy;  Laterality: N/A;   APPENDECTOMY     AV FISTULA PLACEMENT Left 08/17/2019   Procedure: ARTERIOVENOUS (AV) FISTULA CREATION VERSES ARTERIOVENOUS GRAFT;  Surgeon: [name], MD;  Location: [facility] OR;  Service: Vascular;  Laterality: Left;   BASCILIC VEIN TRANSPOSITION Left 09/28/2019   Procedure: LEFT SECOND STAGE [place];  Surgeon: [name], MD;  Location: [place];  Service: Vascular;  Laterality: Left;   BIOPSY  02/03/2018   Procedure: BIOPSY;  Surgeon: Daneil Dolin, MD;  [facility];  Service: Endoscopy;;  bx of gastric polyps   BIOPSY  09/19/2019   Procedure: BIOPSY;  Surgeon: Danie Binder, MD;  [facility];  Service: Endoscopy;;   BIOPSY  01/16/2021   Procedure: BIOPSY;  Surgeon: Daneil Dolin, MD;  [facility];  Service: Endoscopy;;   CATARACT EXTRACTION Right    COLONOSCOPY  03/10/2005   Rectal polyp as described above, removed with snare. Left sided  diverticula. The remainder of the colonic mucosa appeared normal. Inflammed polyp on path.  COLONOSCOPY  04/1999   Dr. Thea Silversmith polyps removed,  Path showed hyperplastic   COLONOSCOPY  10/2015   Dr. Britta Mccreedy: diverticulosis, single sessile tubular adenoma 3-78mm in size removed from descending colon.    COLONOSCOPY WITH ESOPHAGOGASTRODUODENOSCOPY (EGD)  03/31/2012   XBJ:YNWGNFA AVMs. Colonic diverticulosis. tubular adenoma colon   COLONOSCOPY WITH ESOPHAGOGASTRODUODENOSCOPY (EGD)  06/2019   FORSYTH: small esophageal varices without high risk stigmata, single large AVM on lesser curvature in gastric body s/p APC ablation, gastric antral and duodenal bulb polyposis. No significant source to explain transfusion dependent anemia. Colonoscopy with portal colopathy and diffuse edema, changes of Cdiff colitis on colonoscopy    COLONOSCOPY WITH PROPOFOL N/A 01/19/2021   Surgeon: [name], MD; 15 mm polyp in descending colon removed piecemeal, 6 mm polyp in ascending colon and 12 mm polyp in sigmoid colon not removed due to high bleeding risk, benign polypoid lesion at splenic flexure, diverticulosis in sigmoid and descending colon, rectal varices.   COLONOSCOPY WITH PROPOFOL N/A 01/16/2021   Surgeon: Daneil Dolin, MD;  Portal colopathy, large rectal varices, polypoid mass at splenic flexure biopsy, two 1 cm left colon polyps not removed, left-sided diverticulosis.  Reported patient had bleeding from splenic flexure lesion.  Pathology with acutely inflamed colonic mucosa with localized ulceration and prominent reactive changes.   ESOPHAGEAL BANDING N/A 02/03/2018   Procedure: ESOPHAGEAL BANDING;  Surgeon: Daneil Dolin, MD;  [facility];  Service: Endoscopy;  Laterality: N/A;   ESOPHAGEAL BANDING  01/16/2021   Procedure: ESOPHAGEAL BANDING;  Surgeon: Daneil Dolin, MD;  [facility];  Service: Endoscopy;;   ESOPHAGOGASTRODUODENOSCOPY  01/15/2005   Three columns grade 1 to 2 esophageal varices, otherwise normal esophageal mucosa.  Esophagus was not manipulated otherwise./Nodularity of the antrum with overlying erosions, nonspecific finding. Path showed rare H.pylori   ESOPHAGOGASTRODUODENOSCOPY  09/2008   Dr. Gaylene Brooks columns of grade 2-3 esoph varices, only one column was prominent. Portal gastropathy, multiple gastrc polyps at antrum, two were 2cm with black eschar, bulbar polyps, bulbar erosions   ESOPHAGOGASTRODUODENOSCOPY  11/2004   Dr. Brantley Stage 3 esoph varices   ESOPHAGOGASTRODUODENOSCOPY  03/31/2012   RMR: 4 columns(3-GR 2, 1-GR1) non-bleeding esophageal varices, portal gastropathy, small HH, early GAVE, multiple gastric polyps    ESOPHAGOGASTRODUODENOSCOPY (EGD) WITH PROPOFOL N/A 02/03/2018   Dr. Gala Romney: Esophageal varices, 3 columns grade 1-2.  Portal  hypertensive gastropathy.  Multiple gastric polyps, biopsy consistent with hyperplastic.   ESOPHAGOGASTRODUODENOSCOPY (EGD) WITH PROPOFOL N/A 09/19/2019   Fields: grade I and II esophageal varcies, mild portal hypertensive gastropathy, moderate gastritis but no H. pylori, single hyperplastic gastric polyp removed, obvious source for melena/transfusion dependent anemia not identified, may be due to friable gastric and duodenal mucosa in the setting of portal hypertension   ESOPHAGOGASTRODUODENOSCOPY (EGD) WITH PROPOFOL N/A 01/16/2021   Surgeon: Daneil Dolin, MD; Portal hypertensive gastropathy, grade 2 esophageal varices with bleeding stigmata s/p banding x4, slight nodularity and congestion involving bulb and second portion of duodenum, nonspecific.  Recommended repeat EGD in 4 weeks.   ESOPHAGOGASTRODUODENOSCOPY (EGD) WITH PROPOFOL N/A 05/22/2021   Procedure: ESOPHAGOGASTRODUODENOSCOPY (EGD) WITH PROPOFOL;  Surgeon: Daneil Dolin, MD;  [facility];  Service: Endoscopy;  Laterality: N/A;  8:15am, dialysis patient   GASTRIC VARICES BANDING  03/31/2012   Procedure: GASTRIC VARICES BANDING;  Surgeon: Daneil Dolin, MD;  [facility];  Service: Endoscopy;  Laterality: N/A;   GIVENS CAPSULE STUDY N/A 09/21/2019   Poor study with  a lot of debris obstructing much of the view of the first approximate 3 hours out of 6 hours.  No obvious source of bleeding identified.  Sequela of bleeding and old blood seen in the form of"whisps" of blood, blood flecks mostly toward the end of the study.   HEMOSTASIS CLIP PLACEMENT  01/19/2021   Procedure: HEMOSTASIS CLIP PLACEMENT;  Surgeon: [name], MD;  Location: [facility] ENDOSCOPY;  Service: Gastroenterology;;   IR REMOVAL TUN CV CATH W/O FL  02/15/2020   POLYPECTOMY  09/19/2019   Procedure: POLYPECTOMY;  Surgeon: Danie Binder, MD;  [facility];  Service: Endoscopy;;   POLYPECTOMY  01/19/2021   Procedure: POLYPECTOMY;   Surgeon: [name], MD;  Location: [facility] ENDOSCOPY;  Service: Gastroenterology;;   UMBILICAL HERNIA REPAIR  2017   exploratory laparotomy, abdominal washout    Allergies  Allergen Reactions   Tape Itching, Rash and Other (See Comments)    Tears skin    Prior to Admission medications   Medication Sig Start Date End Date Taking? Authorizing Provider  acetaminophen (TYLENOL) 650 MG CR tablet Take 650 mg by mouth every 8 (eight) hours as needed for pain.    [provider]  Alcohol Swabs (ALCOHOL PREP) 70 % PADS Apply 1 each topically 3 (three) times daily. 01/31/20   [provider]  ALPRAZolam Duanne Moron) 0.5 MG tablet Take 0.5 mg by mouth See admin instructions. Take one tablet (0.5 mg) by mouth on Monday, Wednesday, Friday before dialysis, may also take one tablet (0.5 mg) daily as needed for anxiety. 09/21/20   [provider]  Ascorbic Acid (VITAMIN C) 1000 MG tablet Take 1,000 mg by mouth in the morning.    [provider]  BD PEN NEEDLE MICRO U/F 32G X 6 MM MISC SMARTSIG:SUB-Q Daily PRN 04/23/21   [provider]  calcium carbonate (TUMS - DOSED IN MG ELEMENTAL CALCIUM) 500 MG chewable tablet Chew 1 tablet by mouth 3 (three) times daily with meals.    [provider]  Calcium Carbonate-Simethicone (ALKA-SELTZER HEARTBURN + GAS) 750-80 MG CHEW Chew 1-2 tablets by mouth 2 (two) times daily as needed (acid reflux).    [provider]  cetirizine (ZYRTEC) 10 MG tablet Take 10 mg by mouth daily. 06/24/21   [provider]  cholecalciferol (VITAMIN D3) 25 MCG (1000 UNIT) tablet Take 1,000 Units by mouth daily.    [provider]  diltiazem (TIAZAC) 120 MG 24 hr capsule Take 120 mg by mouth daily. 05/28/20   [provider]  furosemide (LASIX) 40 MG tablet Take 80 mg by mouth daily. 12/29/19   [provider]  gabapentin (NEURONTIN) 100 MG capsule TAKE 1 CAPSULE BY MOUTH AT BEDTIME Patient taking  differently: Take 100 mg by mouth 3 (three) times daily. 05/28/20   Waynetta Sandy, MD  Garlic 0762 MG CAPS Take 1,000 mg by mouth daily.    [provider]  GLUCAGEN HYPOKIT 1 MG SOLR injection Inject 1 mg into the muscle once as needed for low blood sugar. 11/28/20   [provider]  HUMALOG KWIKPEN 100 UNIT/ML KwikPen 10-50 Units 3 (three) times daily. Sliding scale 04/23/21   [provider]  hydrocortisone (ANUSOL-HC) 2.5 % rectal cream Place 1 application rectally 2 (two) times daily as needed for hemorrhoids or anal itching. 04/15/21   Johnson, Clanford L, MD  hydrOXYzine (ATARAX/VISTARIL) 25 MG tablet Take 25 mg by mouth 3 (three) times daily. For itching 11/23/20  [provider]  iron polysaccharides (NIFEREX) 150 MG capsule Take 150 mg by mouth daily with supper.    [provider]  lactulose (CONSTULOSE) 10 GM/15ML solution TAKE 45 MLS EVERY MORNING AND TAKE 45 MLS EVERY EVENING Patient taking differently: Take 30 g by mouth See admin instructions. Take 45 mls (30 g) by mouth on Sunday, Tuesday, Thursday, Saturday mornings (non-dialysis days) unless going to MD appointment; take 45 mls (30 g) daily at bedtime 07/16/20   Mahala Menghini, PA-C  levofloxacin (LEVAQUIN) 500 MG tablet TAKE 1 TABLET BY MOUTH THREE TIMES EVERY WEEK AFTER DIALYSIS 03/14/21   Erenest Rasher, PA-C  lidocaine-prilocaine (EMLA) cream Apply 2 application topically daily. 06/11/21   [provider]  metoprolol succinate (TOPROL-XL) 25 MG 24 hr tablet Take 1 tablet (25 mg total) by mouth See admin instructions. Take one tablet (25 mg) by mouth at bedtime every night and take one tablet (25 mg) in morning on  Sun, Tues, Thurs and Sat (non-dialysis days) 04/15/21   Murlean Iba, MD  midodrine (PROAMATINE) 10 MG tablet Take 1 tablet (10 mg total) by mouth 2 (two) times daily with a meal. 04/06/20   Tat, Shanon Brow, MD  milk thistle 175 MG tablet Take 175 mg by  mouth 3 (three) times daily.     [provider]  mirtazapine (REMERON) 30 MG tablet Take 1 tablet (30 mg total) by mouth at bedtime. Patient taking differently: Take 30 mg by mouth daily with supper. 02/10/20   Roxan Hockey, MD  Multiple Vitamin (MULTIVITAMIN WITH MINERALS) TABS tablet Take 1 tablet by mouth in the morning.     [provider]  omega-3 acid ethyl esters (LOVAZA) 1 g capsule Take 2 g by mouth 2 (two) times daily.  04/07/19   [provider]  ondansetron (ZOFRAN) 4 MG tablet Take 4 mg by mouth every 8 (eight) hours as needed for nausea or vomiting.    [provider]  oxyCODONE-acetaminophen (PERCOCET/ROXICET) 5-325 MG tablet Take 1 tablet by mouth every 4 (four) hours as needed for moderate pain. 02/27/20   [provider]  pantoprazole (PROTONIX) 40 MG tablet Take 2 tablets (80 mg total) by mouth daily. 01/21/21   Shelly Coss, MD  Ped Vitamins ACD Fl-Iron (TRI-VIT/FLUORIDE/IRON PO) Take 1 tablet by mouth daily. 08/01/19   [provider]  pravastatin (PRAVACHOL) 20 MG tablet Take 20 mg by mouth in the morning.     [provider]  Probiotic Product (PROBIOTIC DAILY PO) Take 1 tablet by mouth in the morning.     [provider]  spironolactone (ALDACTONE) 50 MG tablet Take 50 mg by mouth in the morning.     [provider]  thiamine (VITAMIN B-1) 100 MG tablet Take 100 mg by mouth daily. 08/27/20   [provider]  tiZANidine (ZANAFLEX) 2 MG tablet Take 2 mg by mouth 2 (two) times daily as needed for muscle spasms. 02/29/20   [provider]  TRESIBA FLEXTOUCH 200 UNIT/ML FlexTouch Pen Inject 120 Units into the skin at bedtime. 04/22/21   [provider]  triamcinolone cream (KENALOG) 0.1 % Apply 1 application topically 2 (two) times daily as needed (dry skin Itching). 12/30/17   [provider]  venlafaxine (EFFEXOR) 75 MG tablet Take 75 mg by mouth 3 (three) times  daily with meals.     [provider]  XIFAXAN 550 MG TABS tablet TAKE 1 TABLET BY MOUTH TWICE DAILY 02/13/21  Mahala Menghini, PA-C    Social History   Socioeconomic History   Marital status: Married    Spouse name: Not on file   Number of children: 2   Years of education: Not on file   Highest education level: Not on file  Occupational History   Occupation: RETIRED    Employer: SELF EMPLOYED  Tobacco Use   Smoking status: Former    Packs/day: 2.00    Years: 25.00    Pack years: 50.00    Types: Cigarettes    Quit date: 01/28/1986    Years since quitting: 35.4   Smokeless tobacco: Never  Vaping Use   Vaping Use: Never used  Substance and Sexual Activity   Alcohol use: No    Comment: quit in 05/2013   Drug use: No   Sexual activity: Not Currently  Other Topics Concern   Not on file  Social History Narrative   Two step children from second marriage (divorced) who live with him along with some grandchildren.    Social Determinants of Health   Financial Resource Strain: Not on file  Food Insecurity: No Food Insecurity   Worried About Charity fundraiser in the Last Year: Never true   Ran Out of Food in the Last Year: Never true  Transportation Needs: No Transportation Needs   Lack of Transportation (Medical): No   Lack of Transportation (Non-Medical): No  Physical Activity: Not on file  Stress: Not on file  Social Connections: Not on file  Intimate Partner Violence: Not on file     Family History  Problem Relation Age of Onset   Colon cancer Father 47       deceased age 43   Breast cancer Sister        deceased   Diabetes Sister    Stroke Mother    Healthy Son    Healthy Daughter    Lung cancer Neg Hx    Ovarian cancer Neg Hx     Review of Systems  Constitutional: Negative.   HENT: Negative.    Eyes: Negative.   Respiratory: Negative.    Cardiovascular: Negative.   Gastrointestinal: Negative.   Musculoskeletal: Negative.   Skin: Negative.    Neurological: Negative.   Endo/Heme/Allergies:  Bruises/bleeds easily.  Psychiatric/Behavioral: Negative.      Physical Examination  Vitals:   07/08/21 1238 07/08/21 1246  BP: (!) 149/70   Pulse: 77   Resp: 18   Temp: 99.1 F (37.3 C)   SpO2: (!) 89% 94%   There is no height or weight on file to calculate BMI.  Physical Exam HENT:     Head: Normocephalic.     Nose:     Comments: Wearing a mask Cardiovascular:     Comments: 2+ left radial pulse Pulmonary:     Effort: Pulmonary effort is normal.  Abdominal:     General: Abdomen is flat.  Musculoskeletal:     Cervical back: Normal range of motion.     Right lower leg: No edema.     Left lower leg: No edema.  Skin:    Capillary Refill: Capillary refill takes less than 2 seconds.     Comments: Significant bruising bilateral upper extremities with dark discoloration of left upper arm as noted in picture below  Neurological:     General: No focal deficit present.     Mental Status: He is alert.  Psychiatric:        Mood and Affect: Mood normal.  Thought Content: Thought content normal.      CBC    Component Value Date/Time   WBC 2.6 (L) 07/03/2021 0940   RBC 2.16 (L) 07/03/2021 0940   HGB 8.8 (L) 07/08/2021 1343   HCT 26.0 (L) 07/08/2021 1343   HCT 28 02/25/2012 1034   PLT 64 (L) 07/03/2021 0940   PLT 125 02/25/2012 1034   MCV 100.9 (H) 07/03/2021 0940   MCV 82.0 02/25/2012 1034   MCH 31.9 07/03/2021 0940   MCHC 31.7 07/03/2021 0940   RDW 18.2 (H) 07/03/2021 0940   LYMPHSABS 0.4 (L) 07/03/2021 0940   MONOABS 0.2 07/03/2021 0940   EOSABS 0.1 07/03/2021 0940   BASOSABS 0.0 07/03/2021 0940    BMET    Component Value Date/Time   NA 129 (L) 07/08/2021 1343   K 4.4 07/08/2021 1343   CL 93 (L) 07/08/2021 1343   CO2 29 07/03/2021 0940   GLUCOSE 251 (H) 07/08/2021 1343   BUN 42 (H) 07/08/2021 1343   CREATININE 5.10 (H) 07/08/2021 1343   CREATININE 2.65 (H) 11/16/2019 1203   CALCIUM 8.0 (L)  07/03/2021 0940   CALCIUM 8.3 (L) 09/19/2020 1024   GFRNONAA 29 (L) 07/03/2021 0940   GFRNONAA 23 (L) 11/16/2019 1203   GFRAA 15 (L) 02/20/2020 0636   GFRAA 27 (L) 11/16/2019 1203    COAGS: Lab Results  Component Value Date   INR 1.3 (H) 04/14/2021   INR 1.2 04/11/2021   INR 1.4 (H) 01/17/2021     Non-Invasive Vascular Imaging:   ULTRASOUND DIALYSIS ACCESS   TECHNIQUE: Grayscale, color Doppler, and duplex sonographic imaging was performed of the left upper extremity dialysis fistula.   COMPARISON:  None.   FINDINGS: Left upper extremity brachiocephalic dialysis fistula is identified.   Limited images of the inflow brachial artery demonstrate wide patency of the vessel with no significant atherosclerotic plaque identified. Expected low resistance arterial waveform noted proximal to the arteriovenous anastomosis. Vessel diameter of 6 mm.   The arteriovenous anastomosis is widely patent with a diameter of 5 mm. Peak time average velocity at the anastomosis of 225 centimeters/second. Estimated flow rate of 2158 cc/minute.   The outflow vein demonstrates no focal stenosis or aneurysm on the presented images. The vessel is uniformly within 2-3 mm of the skin surface. Measured diameter within the proximal, mid, and distal peripheral venous circuit is 8 mm, 12 mm, and 9 mm, respectively. Peak measured flow rate is 3253 cc/minute.   IMPRESSION: Widely patent left upper extremity brachiocephalic dialysis fistula. Peak flow rate at the anastomosis is 2158 cc/minute.    ASSESSMENT/PLAN: This is a 76 y.o. male history of end-stage renal disease with recent infiltration of the left upper arm.  A picture was placed in the chart today and his duplex from yesterday was reviewed with strong flow in the skin overlying the fistula without any concern for ulceration.  Hopefully with time his arm will improve although given his thin skin and easy bruising he may have some issues with skin  breakdown.  For this reason I will have him evaluated in 2 weeks and patient has requested to be seen in [place] with Dr. Donnetta Hutching who initially created the fistula.  If this is not possible we will see him back in [place].  I have discussed with the charge nurse at [facility] in [place] that he is okay to continue dialysis from the left arm fistula and if there are any other issues to contact our office directly.  Erlene Quan  C. Donzetta Matters, MD [facility] Office: [phone] Pager: [phone]

## 2021-07-08 NOTE — ED Provider Notes (Signed)
[name] Provider Note   CSN: [id number] Arrival date & time: 07/08/21  1230     History  Chief Complaint  Patient presents with   Vascular Access Problem    [name] is a 76 y.o. 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  Presents to the emergency department with a complaint of vascular access problem.  Patient states that [name] went to his dialysis appointment earlier today and was told that [name] could not receive dialysis due to concern over his fistula.  Patient was sent to the emergency department.  Patient states that [name] has had some swelling to fistula over the last few days.  Denies any associated pain.  Patient states that [name] last received dialysis on Friday, 06/03/2021.  HPI     Home Medications Prior to Admission medications   Medication Sig Start Date End Date Taking? Authorizing Provider  acetaminophen (TYLENOL) 650 MG CR tablet Take 650 mg by mouth every 8 (eight) hours as needed for pain.    [provider]  Alcohol Swabs (ALCOHOL PREP) 70 % PADS Apply 1 each topically 3 (three) times daily. 01/31/20   [provider]  ALPRAZolam Duanne Moron) 0.5 MG tablet Take 0.5 mg by mouth See admin instructions. Take one tablet (0.5 mg) by mouth on Monday, Wednesday, Friday before dialysis, may also take one tablet (0.5 mg) daily as needed for anxiety. 09/21/20   [provider]  Ascorbic Acid (VITAMIN C) 1000 MG tablet Take 1,000 mg by mouth in the morning.    [provider]  BD PEN NEEDLE MICRO U/F 32G X 6 MM MISC SMARTSIG:SUB-Q Daily PRN 04/23/21   [provider]  calcium carbonate (TUMS - DOSED IN MG ELEMENTAL CALCIUM) 500 MG chewable tablet Chew 1 tablet by mouth 3 (three) times daily with meals.    [provider]  Calcium Carbonate-Simethicone (ALKA-SELTZER HEARTBURN + GAS) 750-80 MG CHEW Chew 1-2 tablets by mouth 2  (two) times daily as needed (acid reflux).    [provider]  cetirizine (ZYRTEC) 10 MG tablet Take 10 mg by mouth daily. 06/24/21   [provider]  cholecalciferol (VITAMIN D3) 25 MCG (1000 UNIT) tablet Take 1,000 Units by mouth daily.    [provider]  diltiazem (TIAZAC) 120 MG 24 hr capsule Take 120 mg by mouth daily. 05/28/20   [provider]  furosemide (LASIX) 40 MG tablet Take 80 mg by mouth daily. 12/29/19   [provider]  gabapentin (NEURONTIN) 100 MG capsule TAKE 1 CAPSULE BY MOUTH AT BEDTIME Patient taking differently: Take 100 mg by mouth 3 (three) times daily. 05/28/20   Waynetta Sandy, MD  Garlic 2426 MG CAPS Take 1,000 mg by mouth daily.    [provider]  GLUCAGEN HYPOKIT 1 MG SOLR injection Inject 1 mg into the muscle once as needed for low blood sugar. 11/28/20   [provider]  HUMALOG KWIKPEN 100 UNIT/ML KwikPen 10-50 Units 3 (three) times daily. Sliding scale 04/23/21   [provider]  hydrocortisone (ANUSOL-HC) 2.5 % rectal cream Place 1 application rectally 2 (two) times daily as needed for hemorrhoids or anal itching. 04/15/21   Johnson, Clanford L, MD  hydrOXYzine (ATARAX/VISTARIL) 25 MG tablet Take 25 mg by mouth 3 (three) times daily. For itching 11/23/20   [provider]  iron polysaccharides (NIFEREX) 150 MG capsule Take 150 mg by mouth daily with supper.  [provider]  lactulose (CONSTULOSE) 10 GM/15ML solution TAKE 45 MLS EVERY MORNING AND TAKE 45 MLS EVERY EVENING Patient taking differently: Take 30 g by mouth See admin instructions. Take 45 mls (30 g) by mouth on Sunday, Tuesday, Thursday, Saturday mornings (non-dialysis days) unless going to MD appointment; take 45 mls (30 g) daily at bedtime 07/16/20   Mahala Menghini, PA-C  levofloxacin (LEVAQUIN) 500 MG tablet TAKE 1 TABLET BY MOUTH THREE TIMES EVERY WEEK AFTER DIALYSIS 03/14/21   Erenest Rasher, PA-C   lidocaine-prilocaine (EMLA) cream Apply 2 application topically daily. 06/11/21   [provider]  metoprolol succinate (TOPROL-XL) 25 MG 24 hr tablet Take 1 tablet (25 mg total) by mouth See admin instructions. Take one tablet (25 mg) by mouth at bedtime every night and take one tablet (25 mg) in morning on  Sun, Tues, Thurs and Sat (non-dialysis days) 04/15/21   Murlean Iba, MD  midodrine (PROAMATINE) 10 MG tablet Take 1 tablet (10 mg total) by mouth 2 (two) times daily with a meal. 04/06/20   Tat, Shanon Brow, MD  milk thistle 175 MG tablet Take 175 mg by mouth 3 (three) times daily.     [provider]  mirtazapine (REMERON) 30 MG tablet Take 1 tablet (30 mg total) by mouth at bedtime. Patient taking differently: Take 30 mg by mouth daily with supper. 02/10/20   Roxan Hockey, MD  Multiple Vitamin (MULTIVITAMIN WITH MINERALS) TABS tablet Take 1 tablet by mouth in the morning.     [provider]  omega-3 acid ethyl esters (LOVAZA) 1 g capsule Take 2 g by mouth 2 (two) times daily.  04/07/19   [provider]  ondansetron (ZOFRAN) 4 MG tablet Take 4 mg by mouth every 8 (eight) hours as needed for nausea or vomiting.    [provider]  oxyCODONE-acetaminophen (PERCOCET/ROXICET) 5-325 MG tablet Take 1 tablet by mouth every 4 (four) hours as needed for moderate pain. 02/27/20   [provider]  pantoprazole (PROTONIX) 40 MG tablet Take 2 tablets (80 mg total) by mouth daily. 01/21/21   Shelly Coss, MD  Ped Vitamins ACD Fl-Iron (TRI-VIT/FLUORIDE/IRON PO) Take 1 tablet by mouth daily. 08/01/19   [provider]  pravastatin (PRAVACHOL) 20 MG tablet Take 20 mg by mouth in the morning.     [provider]  Probiotic Product (PROBIOTIC DAILY PO) Take 1 tablet by mouth in the morning.     [provider]  spironolactone (ALDACTONE) 50 MG tablet Take 50 mg by mouth in the morning.     [provider]  thiamine  (VITAMIN B-1) 100 MG tablet Take 100 mg by mouth daily. 08/27/20   [provider]  tiZANidine (ZANAFLEX) 2 MG tablet Take 2 mg by mouth 2 (two) times daily as needed for muscle spasms. 02/29/20   [provider]  TRESIBA FLEXTOUCH 200 UNIT/ML FlexTouch Pen Inject 120 Units into the skin at bedtime. 04/22/21   [provider]  triamcinolone cream (KENALOG) 0.1 % Apply 1 application topically 2 (two) times daily as needed (dry skin Itching). 12/30/17   [provider]  venlafaxine (EFFEXOR) 75 MG tablet Take 75 mg by mouth 3 (three) times daily with meals.     [provider]  XIFAXAN 550 MG TABS tablet TAKE 1 TABLET BY MOUTH TWICE DAILY 02/13/21   Mahala Menghini, PA-C      Allergies    Tape    Review of Systems  Review of Systems  Constitutional:  Negative for chills and fever.  Skin:  Negative for color change, pallor, rash and wound.  Neurological:  Negative for weakness and numbness.   Physical Exam Updated Vital Signs BP (!) 149/70 (BP Location: Right Arm)    Pulse 77    Temp 99.1 F (37.3 C) (Oral)    Resp 18    SpO2 94%  Physical Exam Vitals and nursing note reviewed.  Constitutional:      General: [name] is not in acute distress.    Appearance: [name] is not ill-appearing, toxic-appearing or diaphoretic.  HENT:     Head: Normocephalic.  Eyes:     General: No scleral icterus.       Right eye: No discharge.        Left eye: No discharge.  Cardiovascular:     Rate and Rhythm: Normal rate.  Pulmonary:     Effort: Pulmonary effort is normal.  Musculoskeletal:     Comments: Fistula to left upper extremity.  Overlying ecchymosis.  Slight swelling noted to fistula.  Palpable thrill.  Skin:    General: Skin is warm and dry.  Neurological:     General: No focal deficit present.     Mental Status: [name] is alert.  Psychiatric:        Behavior: Behavior is cooperative.    ED Results / Procedures / Treatments   Labs (all labs ordered are  listed, but only abnormal results are displayed) Labs Reviewed  CBC WITH DIFFERENTIAL/PLATELET - Abnormal; Notable for the following components:      Result Value   WBC 3.0 (*)    RBC 2.62 (*)    Hemoglobin 8.1 (*)    HCT 25.9 (*)    RDW 19.6 (*)    Platelets 52 (*)    Lymphs Abs 0.3 (*)    All other components within normal limits  I-STAT CHEM 8, ED - Abnormal; Notable for the following components:   Sodium 129 (*)    Chloride 93 (*)    BUN 42 (*)    Creatinine, Ser 5.10 (*)    Glucose, Bld 251 (*)    Hemoglobin 8.8 (*)    HCT 26.0 (*)    All other components within normal limits  COMPREHENSIVE METABOLIC PANEL    EKG None  Radiology [place] Dialysis Access  Result Date: 07/07/2021 CLINICAL DATA:  Left upper extremity arteriovenous fistula. Left arm bruising. EXAM: ULTRASOUND DIALYSIS ACCESS TECHNIQUE: Grayscale, color Doppler, and duplex sonographic imaging was performed of the left upper extremity dialysis fistula. COMPARISON:  None. FINDINGS: Left upper extremity brachiocephalic dialysis fistula is identified. Limited images of the inflow brachial artery demonstrate wide patency of the vessel with no significant atherosclerotic plaque identified. Expected low resistance arterial waveform noted proximal to the arteriovenous anastomosis. Vessel diameter of 6 mm. The arteriovenous anastomosis is widely patent with a diameter of 5 mm. Peak time average velocity at the anastomosis of 225 centimeters/second. Estimated flow rate of 2158 cc/minute. The outflow vein demonstrates no focal stenosis or aneurysm on the presented images. The vessel is uniformly within 2-3 mm of the skin surface. Measured diameter within the proximal, mid, and distal peripheral venous circuit is 8 mm, 12 mm, and 9 mm, respectively. Peak measured flow rate is 3253 cc/minute. IMPRESSION: Widely patent left upper extremity brachiocephalic dialysis fistula. Peak flow rate at the anastomosis is 2158 cc/minute. Electronically  Signed   By: Fidela Salisbury M.D.   On: 07/07/2021 22:34  Procedures Procedures    Medications Ordered in ED Medications - No data to display  ED Course/ Medical Decision Making/ A&P                           Medical Decision Making Amount and/or Complexity of Data Reviewed Labs: ordered.   Alert 76 year old male in no acute stress, nontoxic-appearing.  Presents the emergency department with a complaint of vascular access problem.  Patient has medical history of end-stage renal disease on hemodialysis, atrial fibrillation, diabetes mellitus which complicate his care.  Information was obtained from patient.  Medical record was reviewed including previous lab results, imaging, and provider notes.  Per chart review patient was seen at any point in department yesterday.  Patient had ultrasound dialysis access performed which showed widely patent fistula.  Due to continued concern for vascular access I spoke with on-call vascular surgeon [name].  Patient was examined by [name].  [name] recommends patient be reevaluated in 2 weeks by [name] who initially created the fistula, and if that is not possible with [name] back in [place].  Patient is okay to continue with dialysis from left arm fistula.  Patient is to contact [name] office if there are any further issues with his dialysis access.  Lab work was ordered and independently reviewed by myself.  Pertinent findings include -creatinine elevated at 5.10; likely secondary to his ESRD and missed dialysis -Calcium within normal limits -Glucose 251 -Leukopenia and anemia noted however improved from previous lab results. -Thrombocytopenia slightly decreased from previous  Discussed results, findings, treatment and follow up. Patient advised of return precautions. Patient verbalized understanding and agreed with plan.  Patient will benefit from discharge and follow-up with vascular surgery in outpatient setting.  Patient  will need to follow-up with his dialysis center tomorrow for treatment.        Final Clinical Impression(s) / ED Diagnoses Final diagnoses:  Problem with vascular access    Rx / DC Orders ED Discharge Orders     None         [name] 07/08/21 2208    Jeffrey Bo, MD 07/09/21 1143

## 2021-07-17 ENCOUNTER — Other Ambulatory Visit: Payer: Self-pay | Admitting: Gastroenterology

## 2021-07-17 ENCOUNTER — Other Ambulatory Visit (HOSPITAL_COMMUNITY): Payer: Medicare Other

## 2021-07-22 ENCOUNTER — Ambulatory Visit: Payer: Medicare Other | Admitting: Gastroenterology

## 2021-07-23 ENCOUNTER — Ambulatory Visit: Payer: Medicare Other | Admitting: Vascular Surgery

## 2021-07-23 ENCOUNTER — Other Ambulatory Visit: Payer: Self-pay

## 2021-07-23 ENCOUNTER — Encounter: Payer: Self-pay | Admitting: Vascular Surgery

## 2021-07-23 VITALS — BP 158/73 | HR 72 | Temp 97.5°F | Resp 18 | Ht 67.0 in | Wt 194.2 lb

## 2021-07-23 DIAGNOSIS — Z992 Dependence on renal dialysis: Secondary | ICD-10-CM | POA: Diagnosis not present

## 2021-07-23 DIAGNOSIS — N186 End stage renal disease: Secondary | ICD-10-CM

## 2021-07-23 NOTE — Progress Notes (Signed)
[facility]  Patient name: [name] MRN: [id number] DOB: Apr 28, 1946 Sex: male  REASON FOR VISIT: Check of left arm AV fistula  HPI: [name] is a 76 y.o. male here today with his wife.  He is well-known to me from prior placement of left second stage basilic vein transposition fistula on 09/28/2019.  He has a history of cirrhosis.  He is not on anticoagulation but does have easy bruising.  This is presumed related to his liver disease.  He was seen in the emergency department by Dr.Cain on 07/08/2021.  He had had infiltration of his left upper arm fistula.  It was felt at that time that this would resolve spontaneously and that the skin was not threatened.  He has continued to use his fistula and is here today for 2-week follow-up to assure that he is not having any worsening issues regarding his fistula.  The patient and his wife report that there is been no difficulty accessing the fistula.  Past Medical History:  Diagnosis Date   A-fib [facility])    when initially stating dialysis january 2021 at [facility] per spouse; never heard anything else about it   Alcoholic cirrhosis [facility])    patient reports completing hep a and b vaccines in 2006   Anemia    has had 3 units of prbcs 2013   Anxiety    Arthritis    Asymptomatic gallstones    Ultrasound in 2006   B12 deficiency 11/21/2020   C. difficile colitis    Cholelithiasis    Chronic kidney disease    Dialysis M/W/F/Sa   Depression    Diabetes ([place])    Duodenal ulcer with hemorrhage    per patient in 2006 or 2007, records have been requested   Dyspnea    low oxygen level -  has O2 2 L all day   Esophageal varices ([place])    see [facility]   GERD (gastroesophageal reflux disease)    Heart burn    History of alcohol abuse    quit 05/2013   HOH (hard of hearing)    Hypertension    Liver cirrhosis ([place])    ETOH   SBP (spontaneous bacterial peritonitis) ([place])     2020, November 2021.    Splenomegaly    Ultrasound in 2006    Family History  Problem Relation Age of Onset   Colon cancer Father 56       deceased age 66   Breast cancer Sister        deceased   Diabetes Sister    Stroke Mother    Healthy Son    Healthy Daughter    Lung cancer Neg Hx    Ovarian cancer Neg Hx     SOCIAL HISTORY: Social History   Tobacco Use   Smoking status: Former    Packs/day: 2.00    Years: 25.00    Pack years: 50.00    Types: Cigarettes    Quit date: 01/28/1986    Years since quitting: 35.5   Smokeless tobacco: Never  Substance Use Topics   Alcohol use: No    Comment: quit in 05/2013    Allergies  Allergen Reactions   Tape Itching, Rash and Other (See Comments)    Tears skin    Current Outpatient Medications  Medication Sig Dispense Refill   acetaminophen (TYLENOL) 650 MG CR tablet Take 650 mg by mouth every 8 (eight) hours as needed for  pain.     Alcohol Swabs (ALCOHOL PREP) 70 % PADS Apply 1 each topically 3 (three) times daily.     ALPRAZolam (XANAX) 0.5 MG tablet Take 0.5 mg by mouth See admin instructions. Take one tablet (0.5 mg) by mouth on Monday, Wednesday, Friday before dialysis, may also take one tablet (0.5 mg) daily as needed for anxiety.     Ascorbic Acid (VITAMIN C) 1000 MG tablet Take 1,000 mg by mouth in the morning.     BD PEN NEEDLE MICRO U/F 32G X 6 MM MISC SMARTSIG:SUB-Q Daily PRN     calcium carbonate (TUMS - DOSED IN MG ELEMENTAL CALCIUM) 500 MG chewable tablet Chew 1 tablet by mouth 3 (three) times daily with meals.     Calcium Carbonate-Simethicone (ALKA-SELTZER HEARTBURN + GAS) 750-80 MG CHEW Chew 1-2 tablets by mouth 2 (two) times daily as needed (acid reflux).     cetirizine (ZYRTEC) 10 MG tablet Take 10 mg by mouth daily.     cholecalciferol (VITAMIN D3) 25 MCG (1000 UNIT) tablet Take 1,000 Units by mouth daily.     diltiazem (TIAZAC) 120 MG 24 hr capsule Take 120 mg by mouth daily.     furosemide (LASIX) 40 MG  tablet Take 80 mg by mouth daily.     gabapentin (NEURONTIN) 100 MG capsule TAKE 1 CAPSULE BY MOUTH AT BEDTIME (Patient taking differently: Take 100 mg by mouth 3 (three) times daily.) 90 capsule 0   Garlic 9381 MG CAPS Take 1,000 mg by mouth daily.     GLUCAGEN HYPOKIT 1 MG SOLR injection Inject 1 mg into the muscle once as needed for low blood sugar.     HUMALOG KWIKPEN 100 UNIT/ML KwikPen 10-50 Units 3 (three) times daily. Sliding scale     hydrocortisone (ANUSOL-HC) 2.5 % rectal cream Place 1 application rectally 2 (two) times daily as needed for hemorrhoids or anal itching.     hydrOXYzine (ATARAX/VISTARIL) 25 MG tablet Take 25 mg by mouth 3 (three) times daily. For itching     iron polysaccharides (NIFEREX) 150 MG capsule Take 150 mg by mouth daily with supper.     lactulose (CONSTULOSE) 10 GM/15ML solution TAKE 45 MLS EVERY MORNING AND TAKE 45 MLS EVERY EVENING 2700 mL 12   levofloxacin (LEVAQUIN) 500 MG tablet TAKE 1 TABLET BY MOUTH THREE TIMES EVERY WEEK AFTER DIALYSIS 12 tablet 11   lidocaine-prilocaine (EMLA) cream Apply 2 application topically daily.     metoprolol succinate (TOPROL-XL) 25 MG 24 hr tablet Take 1 tablet (25 mg total) by mouth See admin instructions. Take one tablet (25 mg) by mouth at bedtime every night and take one tablet (25 mg) in morning on  Sun, Tues, Thurs and Sat (non-dialysis days)     midodrine (PROAMATINE) 10 MG tablet Take 1 tablet (10 mg total) by mouth 2 (two) times daily with a meal. 60 tablet 1   milk thistle 175 MG tablet Take 175 mg by mouth 3 (three) times daily.      mirtazapine (REMERON) 30 MG tablet Take 1 tablet (30 mg total) by mouth at bedtime. (Patient taking differently: Take 30 mg by mouth daily with supper.) 30 tablet 3   Multiple Vitamin (MULTIVITAMIN WITH MINERALS) TABS tablet Take 1 tablet by mouth in the morning.      omega-3 acid ethyl esters (LOVAZA) 1 g capsule Take 2 g by mouth 2 (two) times daily.      ondansetron (ZOFRAN) 4 MG tablet  Take 4 mg by  mouth every 8 (eight) hours as needed for nausea or vomiting.     oxyCODONE-acetaminophen (PERCOCET/ROXICET) 5-325 MG tablet Take 1 tablet by mouth every 4 (four) hours as needed for moderate pain.     pantoprazole (PROTONIX) 40 MG tablet Take 2 tablets (80 mg total) by mouth daily. 60 tablet 0   Ped Vitamins ACD Fl-Iron (TRI-VIT/FLUORIDE/IRON PO) Take 1 tablet by mouth daily.     pravastatin (PRAVACHOL) 20 MG tablet Take 20 mg by mouth in the morning.      Probiotic Product (PROBIOTIC DAILY PO) Take 1 tablet by mouth in the morning.      spironolactone (ALDACTONE) 50 MG tablet Take 50 mg by mouth in the morning.      thiamine (VITAMIN B-1) 100 MG tablet Take 100 mg by mouth daily.     tiZANidine (ZANAFLEX) 2 MG tablet Take 2 mg by mouth 2 (two) times daily as needed for muscle spasms.     TRESIBA FLEXTOUCH 200 UNIT/ML FlexTouch Pen Inject 120 Units into the skin at bedtime.     triamcinolone cream (KENALOG) 0.1 % Apply 1 application topically 2 (two) times daily as needed (dry skin Itching).  3   venlafaxine (EFFEXOR) 75 MG tablet Take 75 mg by mouth 3 (three) times daily with meals.      XIFAXAN 550 MG TABS tablet TAKE 1 TABLET BY MOUTH TWICE DAILY 60 tablet 11   No current facility-administered medications for this visit.    REVIEW OF SYSTEMS:  [X]  denotes positive finding, [ ]  denotes negative finding Cardiac  Comments:  Chest pain or chest pressure:    Shortness of breath upon exertion:    Short of breath when lying flat:    Irregular heart rhythm:        Vascular    Pain in calf, thigh, or hip brought on by ambulation:    Pain in feet at night that wakes you up from your sleep:     Blood clot in your veins:    Leg swelling:           PHYSICAL EXAM: Vitals:   07/23/21 0951  BP: (!) 158/73  Pulse: 72  Resp: 18  Temp: (!) 97.5 F (36.4 C)  TempSrc: Temporal  SpO2: 90%  Weight: 194 lb 4 oz (88.1 kg)  Height: 5\' 7"  (1.702 m)    GENERAL: The patient is a  well-nourished male, in no acute distress. The vital signs are documented above. CARDIOVASCULAR: 2+ left radial pulse.  Excellent thrill in his left upper arm AV fistula.  Does have some moderate diffuse aneurysmal change.  His skin is completely intact and he has no evidence of hematoma.  He does have diffuse bruising around his arm but also on other places on both arms. PULMONARY: There is good air exchange  MUSCULOSKELETAL: There are no major deformities or cyanosis. NEUROLOGIC: No focal weakness or paresthesias are detected. SKIN: There are no ulcers or rashes noted. PSYCHIATRIC: The patient has a normal affect.  DATA:  None  MEDICAL ISSUES: Expected resolution of infiltration with no evidence of skin breakdown.  We will continue using his fistula and see [place] on an as-needed basis    [name], MD FACS [facility] Office Tel [phone]  Note: Portions of this report may have been transcribed using voice recognition software.  Every effort has been made to ensure accuracy; however, inadvertent computerized transcription errors may still be present.

## 2021-07-28 ENCOUNTER — Telehealth: Payer: Self-pay | Admitting: Gastroenterology

## 2021-07-28 ENCOUNTER — Emergency Department (HOSPITAL_COMMUNITY): Payer: Medicare Other

## 2021-07-28 ENCOUNTER — Other Ambulatory Visit: Payer: Self-pay

## 2021-07-28 ENCOUNTER — Encounter (HOSPITAL_COMMUNITY): Payer: Self-pay

## 2021-07-28 ENCOUNTER — Observation Stay (HOSPITAL_COMMUNITY)
Admission: EM | Admit: 2021-07-28 | Discharge: 2021-07-30 | Disposition: A | Discharge status: Home / Self Care | Payer: Medicare Other | Attending: Internal Medicine | Admitting: Internal Medicine

## 2021-07-28 DIAGNOSIS — J9811 Atelectasis: Secondary | ICD-10-CM | POA: Diagnosis not present

## 2021-07-28 DIAGNOSIS — K7030 Alcoholic cirrhosis of liver without ascites: Secondary | ICD-10-CM | POA: Diagnosis not present

## 2021-07-28 DIAGNOSIS — E860 Dehydration: Secondary | ICD-10-CM | POA: Insufficient documentation

## 2021-07-28 DIAGNOSIS — E8809 Other disorders of plasma-protein metabolism, not elsewhere classified: Secondary | ICD-10-CM | POA: Diagnosis not present

## 2021-07-28 DIAGNOSIS — R770 Abnormality of albumin: Secondary | ICD-10-CM | POA: Diagnosis not present

## 2021-07-28 DIAGNOSIS — I4891 Unspecified atrial fibrillation: Secondary | ICD-10-CM | POA: Insufficient documentation

## 2021-07-28 DIAGNOSIS — D72829 Elevated white blood cell count, unspecified: Secondary | ICD-10-CM | POA: Insufficient documentation

## 2021-07-28 DIAGNOSIS — E1122 Type 2 diabetes mellitus with diabetic chronic kidney disease: Secondary | ICD-10-CM

## 2021-07-28 DIAGNOSIS — Z8711 Personal history of peptic ulcer disease: Secondary | ICD-10-CM | POA: Insufficient documentation

## 2021-07-28 DIAGNOSIS — Z794 Long term (current) use of insulin: Secondary | ICD-10-CM | POA: Diagnosis not present

## 2021-07-28 DIAGNOSIS — Z6828 Body mass index (BMI) 28.0-28.9, adult: Secondary | ICD-10-CM | POA: Insufficient documentation

## 2021-07-28 DIAGNOSIS — M898X9 Other specified disorders of bone, unspecified site: Secondary | ICD-10-CM | POA: Insufficient documentation

## 2021-07-28 DIAGNOSIS — F10188 Alcohol abuse with other alcohol-induced disorder: Secondary | ICD-10-CM | POA: Insufficient documentation

## 2021-07-28 DIAGNOSIS — R161 Splenomegaly, not elsewhere classified: Secondary | ICD-10-CM | POA: Insufficient documentation

## 2021-07-28 DIAGNOSIS — R197 Diarrhea, unspecified: Secondary | ICD-10-CM

## 2021-07-28 DIAGNOSIS — A0472 Enterocolitis due to Clostridium difficile, not specified as recurrent: Secondary | ICD-10-CM | POA: Diagnosis not present

## 2021-07-28 DIAGNOSIS — N183 Chronic kidney disease, stage 3 unspecified: Secondary | ICD-10-CM | POA: Insufficient documentation

## 2021-07-28 DIAGNOSIS — F1011 Alcohol abuse, in remission: Secondary | ICD-10-CM | POA: Insufficient documentation

## 2021-07-28 DIAGNOSIS — J9611 Chronic respiratory failure with hypoxia: Secondary | ICD-10-CM | POA: Insufficient documentation

## 2021-07-28 DIAGNOSIS — I1311 Hypertensive heart and chronic kidney disease without heart failure, with stage 5 chronic kidney disease, or end stage renal disease: Secondary | ICD-10-CM | POA: Insufficient documentation

## 2021-07-28 DIAGNOSIS — A498 Other bacterial infections of unspecified site: Secondary | ICD-10-CM

## 2021-07-28 DIAGNOSIS — N2581 Secondary hyperparathyroidism of renal origin: Secondary | ICD-10-CM | POA: Insufficient documentation

## 2021-07-28 DIAGNOSIS — K7460 Unspecified cirrhosis of liver: Secondary | ICD-10-CM | POA: Diagnosis present

## 2021-07-28 DIAGNOSIS — E876 Hypokalemia: Secondary | ICD-10-CM | POA: Insufficient documentation

## 2021-07-28 DIAGNOSIS — F1029 Alcohol dependence with unspecified alcohol-induced disorder: Secondary | ICD-10-CM | POA: Insufficient documentation

## 2021-07-28 DIAGNOSIS — D649 Anemia, unspecified: Principal | ICD-10-CM | POA: Insufficient documentation

## 2021-07-28 DIAGNOSIS — E538 Deficiency of other specified B group vitamins: Secondary | ICD-10-CM | POA: Insufficient documentation

## 2021-07-28 DIAGNOSIS — R0989 Other specified symptoms and signs involving the circulatory and respiratory systems: Secondary | ICD-10-CM | POA: Insufficient documentation

## 2021-07-28 DIAGNOSIS — Z713 Dietary counseling and surveillance: Secondary | ICD-10-CM | POA: Insufficient documentation

## 2021-07-28 DIAGNOSIS — Z8719 Personal history of other diseases of the digestive system: Secondary | ICD-10-CM | POA: Diagnosis not present

## 2021-07-28 DIAGNOSIS — Z792 Long term (current) use of antibiotics: Secondary | ICD-10-CM | POA: Diagnosis not present

## 2021-07-28 DIAGNOSIS — J90 Pleural effusion, not elsewhere classified: Secondary | ICD-10-CM | POA: Insufficient documentation

## 2021-07-28 DIAGNOSIS — D61818 Other pancytopenia: Secondary | ICD-10-CM | POA: Insufficient documentation

## 2021-07-28 DIAGNOSIS — D72819 Decreased white blood cell count, unspecified: Secondary | ICD-10-CM | POA: Insufficient documentation

## 2021-07-28 DIAGNOSIS — N186 End stage renal disease: Secondary | ICD-10-CM

## 2021-07-28 DIAGNOSIS — Z20822 Contact with and (suspected) exposure to covid-19: Secondary | ICD-10-CM | POA: Diagnosis not present

## 2021-07-28 DIAGNOSIS — Z9981 Dependence on supplemental oxygen: Secondary | ICD-10-CM | POA: Insufficient documentation

## 2021-07-28 DIAGNOSIS — Z87891 Personal history of nicotine dependence: Secondary | ICD-10-CM | POA: Insufficient documentation

## 2021-07-28 DIAGNOSIS — D509 Iron deficiency anemia, unspecified: Secondary | ICD-10-CM | POA: Diagnosis not present

## 2021-07-28 DIAGNOSIS — Z992 Dependence on renal dialysis: Secondary | ICD-10-CM | POA: Insufficient documentation

## 2021-07-28 DIAGNOSIS — D696 Thrombocytopenia, unspecified: Secondary | ICD-10-CM | POA: Insufficient documentation

## 2021-07-28 DIAGNOSIS — R944 Abnormal results of kidney function studies: Secondary | ICD-10-CM | POA: Insufficient documentation

## 2021-07-28 DIAGNOSIS — K8020 Calculus of gallbladder without cholecystitis without obstruction: Secondary | ICD-10-CM | POA: Insufficient documentation

## 2021-07-28 DIAGNOSIS — Z79899 Other long term (current) drug therapy: Secondary | ICD-10-CM | POA: Insufficient documentation

## 2021-07-28 DIAGNOSIS — I491 Atrial premature depolarization: Secondary | ICD-10-CM | POA: Diagnosis not present

## 2021-07-28 DIAGNOSIS — F419 Anxiety disorder, unspecified: Secondary | ICD-10-CM | POA: Insufficient documentation

## 2021-07-28 DIAGNOSIS — I8510 Secondary esophageal varices without bleeding: Secondary | ICD-10-CM | POA: Insufficient documentation

## 2021-07-28 DIAGNOSIS — I120 Hypertensive chronic kidney disease with stage 5 chronic kidney disease or end stage renal disease: Secondary | ICD-10-CM | POA: Insufficient documentation

## 2021-07-28 DIAGNOSIS — K219 Gastro-esophageal reflux disease without esophagitis: Secondary | ICD-10-CM | POA: Insufficient documentation

## 2021-07-28 LAB — RESP PANEL BY RT-PCR (FLU A&B, COVID) ARPGX2
Influenza A by PCR: NEGATIVE
Influenza B by PCR: NEGATIVE
SARS Coronavirus 2 by RT PCR: NEGATIVE

## 2021-07-28 LAB — COMPREHENSIVE METABOLIC PANEL
ALT: 20 U/L (ref 0–44)
AST: 25 U/L (ref 15–41)
Albumin: 2.7 g/dL — ABNORMAL LOW (ref 3.5–5.0)
Alkaline Phosphatase: 137 U/L — ABNORMAL HIGH (ref 38–126)
Anion gap: 12 (ref 5–15)
BUN: 38 mg/dL — ABNORMAL HIGH (ref 8–23)
CO2: 21 mmol/L — ABNORMAL LOW (ref 22–32)
Calcium: 8.5 mg/dL — ABNORMAL LOW (ref 8.9–10.3)
Chloride: 98 mmol/L (ref 98–111)
Creatinine, Ser: 4.06 mg/dL — ABNORMAL HIGH (ref 0.61–1.24)
GFR, Estimated: 15 mL/min — ABNORMAL LOW (ref >60)
Glucose, Bld: 191 mg/dL — ABNORMAL HIGH (ref 70–99)
Potassium: 3.1 mmol/L — ABNORMAL LOW (ref 3.5–5.1)
Sodium: 131 mmol/L — ABNORMAL LOW (ref 135–145)
Total Bilirubin: 0.6 mg/dL (ref 0.3–1.2)
Total Protein: 6.3 g/dL — ABNORMAL LOW (ref 6.5–8.1)

## 2021-07-28 LAB — CBC WITH DIFFERENTIAL/PLATELET
Abs Immature Granulocytes: 0 10*3/uL (ref 0.00–0.07)
Basophils Absolute: 0 10*3/uL (ref 0.0–0.1)
Basophils Relative: 2 %
Eosinophils Absolute: 0 10*3/uL (ref 0.0–0.5)
Eosinophils Relative: 2 %
HCT: 21.3 % — ABNORMAL LOW (ref 39.0–52.0)
Hemoglobin: 6.6 g/dL — CL (ref 13.0–17.0)
Immature Granulocytes: 0 %
Lymphocytes Relative: 8 %
Lymphs Abs: 0.1 10*3/uL — ABNORMAL LOW (ref 0.7–4.0)
MCH: 30.3 pg (ref 26.0–34.0)
MCHC: 31 g/dL (ref 30.0–36.0)
MCV: 97.7 fL (ref 80.0–100.0)
Monocytes Absolute: 0.1 10*3/uL (ref 0.1–1.0)
Monocytes Relative: 8 %
Neutro Abs: 1.3 10*3/uL — ABNORMAL LOW (ref 1.7–7.7)
Neutrophils Relative %: 80 %
Platelets: 51 10*3/uL — ABNORMAL LOW (ref 150–400)
RBC: 2.18 MIL/uL — ABNORMAL LOW (ref 4.22–5.81)
RDW: 19 % — ABNORMAL HIGH (ref 11.5–15.5)
WBC: 1.6 10*3/uL — ABNORMAL LOW (ref 4.0–10.5)
nRBC: 0 % (ref 0.0–0.2)

## 2021-07-28 LAB — C DIFFICILE QUICK SCREEN W PCR REFLEX
C Diff antigen: POSITIVE — AB
C Diff toxin: NEGATIVE

## 2021-07-28 LAB — LIPASE, BLOOD: Lipase: 22 U/L (ref 11–51)

## 2021-07-28 LAB — MAGNESIUM: Magnesium: 1.5 mg/dL — ABNORMAL LOW (ref 1.7–2.4)

## 2021-07-28 LAB — GLUCOSE, CAPILLARY: Glucose-Capillary: 156 mg/dL — ABNORMAL HIGH (ref 70–99)

## 2021-07-28 LAB — PREPARE RBC (CROSSMATCH)

## 2021-07-28 LAB — POC OCCULT BLOOD, ED: Fecal Occult Bld: NEGATIVE

## 2021-07-28 MED ORDER — SPIRONOLACTONE 25 MG PO TABS
50.0000 mg | ORAL_TABLET | Freq: Every morning | ORAL | Status: DC
  Administered 2021-07-29 – 2021-07-30 (×2): 50 mg via ORAL
  Filled 2021-07-28 (×2): qty 2

## 2021-07-28 MED ORDER — METOPROLOL SUCCINATE ER 25 MG PO TB24
25.0000 mg | ORAL_TABLET | Freq: Every day | ORAL | Status: DC
  Administered 2021-07-28: 25 mg via ORAL
  Filled 2021-07-28 (×2): qty 1

## 2021-07-28 MED ORDER — CHLORHEXIDINE GLUCONATE CLOTH 2 % EX PADS
6.0000 | MEDICATED_PAD | Freq: Every day | CUTANEOUS | Status: DC
  Administered 2021-07-29 – 2021-07-30 (×2): 6 via TOPICAL

## 2021-07-28 MED ORDER — INSULIN ASPART 100 UNIT/ML IJ SOLN: 0.0000 [IU] | Freq: Every day | INTRAMUSCULAR | Status: DC

## 2021-07-28 MED ORDER — DILTIAZEM HCL ER BEADS 120 MG PO CP24
120.0000 mg | ORAL_CAPSULE | Freq: Every day | ORAL | Status: DC
  Administered 2021-07-29 – 2021-07-30 (×2): 120 mg via ORAL
  Filled 2021-07-28 (×6): qty 1

## 2021-07-28 MED ORDER — INSULIN ASPART 100 UNIT/ML IJ SOLN: 0.0000 [IU] | Freq: Three times a day (TID) | INTRAMUSCULAR | Status: DC

## 2021-07-28 MED ORDER — ALPRAZOLAM 0.5 MG PO TABS
0.5000 mg | ORAL_TABLET | ORAL | Status: DC
  Administered 2021-07-30: 0.5 mg via ORAL
  Filled 2021-07-28: qty 1

## 2021-07-28 MED ORDER — MIDODRINE HCL 5 MG PO TABS
10.0000 mg | ORAL_TABLET | Freq: Two times a day (BID) | ORAL | Status: DC
  Administered 2021-07-29 – 2021-07-30 (×3): 10 mg via ORAL
  Filled 2021-07-28 (×3): qty 2

## 2021-07-28 MED ORDER — LACTULOSE 10 GM/15ML PO SOLN: 30.0000 g | Freq: Two times a day (BID) | ORAL | Status: DC | PRN

## 2021-07-28 MED ORDER — HEPARIN SODIUM (PORCINE) 5000 UNIT/ML IJ SOLN
5000.0000 [IU] | Freq: Three times a day (TID) | INTRAMUSCULAR | Status: DC
  Administered 2021-07-28 – 2021-07-29 (×2): 5000 [IU] via SUBCUTANEOUS
  Filled 2021-07-28 (×2): qty 1

## 2021-07-28 MED ORDER — VENLAFAXINE HCL 37.5 MG PO TABS
75.0000 mg | ORAL_TABLET | Freq: Three times a day (TID) | ORAL | Status: DC
Start: 2021-07-29 — End: 2021-07-30
  Administered 2021-07-29 – 2021-07-30 (×4): 75 mg via ORAL
  Filled 2021-07-28 (×4): qty 2

## 2021-07-28 MED ORDER — LEVOFLOXACIN 500 MG PO TABS
500.0000 mg | ORAL_TABLET | ORAL | Status: DC
  Administered 2021-07-30: 500 mg via ORAL
  Filled 2021-07-28: qty 1

## 2021-07-28 MED ORDER — METOPROLOL SUCCINATE ER 25 MG PO TB24
25.0000 mg | ORAL_TABLET | ORAL | Status: DC
  Administered 2021-07-29: 25 mg via ORAL
  Filled 2021-07-28: qty 1

## 2021-07-28 MED ORDER — PRAVASTATIN SODIUM 10 MG PO TABS
20.0000 mg | ORAL_TABLET | Freq: Every morning | ORAL | Status: DC
Start: 2021-07-29 — End: 2021-07-30
  Administered 2021-07-29: 20 mg via ORAL
  Filled 2021-07-28: qty 2

## 2021-07-28 MED ORDER — MORPHINE SULFATE (PF) 2 MG/ML IV SOLN: 2.0000 mg | INTRAVENOUS | Status: DC | PRN

## 2021-07-28 MED ORDER — ALPRAZOLAM 0.5 MG PO TABS: 0.5000 mg | ORAL_TABLET | Freq: Every day | ORAL | Status: DC | PRN

## 2021-07-28 MED ORDER — PANTOPRAZOLE SODIUM 40 MG PO TBEC
80.0000 mg | DELAYED_RELEASE_TABLET | Freq: Every day | ORAL | Status: DC
  Administered 2021-07-29 – 2021-07-30 (×2): 80 mg via ORAL
  Filled 2021-07-28 (×2): qty 2

## 2021-07-28 MED ORDER — GABAPENTIN 100 MG PO CAPS
100.0000 mg | ORAL_CAPSULE | Freq: Three times a day (TID) | ORAL | Status: DC
  Administered 2021-07-28 – 2021-07-30 (×5): 100 mg via ORAL
  Filled 2021-07-28 (×5): qty 1

## 2021-07-28 MED ORDER — RIFAXIMIN 550 MG PO TABS
550.0000 mg | ORAL_TABLET | Freq: Two times a day (BID) | ORAL | Status: DC
  Administered 2021-07-28 – 2021-07-30 (×4): 550 mg via ORAL
  Filled 2021-07-28 (×4): qty 1

## 2021-07-28 MED ORDER — MAGNESIUM OXIDE -MG SUPPLEMENT 400 (240 MG) MG PO TABS
400.0000 mg | ORAL_TABLET | Freq: Once | ORAL | Status: AC
  Administered 2021-07-28: 400 mg via ORAL
  Filled 2021-07-28: qty 1

## 2021-07-28 MED ORDER — OXYCODONE-ACETAMINOPHEN 5-325 MG PO TABS: 1.0000 | ORAL_TABLET | ORAL | Status: DC | PRN

## 2021-07-28 MED ORDER — HYDROXYZINE HCL 25 MG PO TABS
25.0000 mg | ORAL_TABLET | Freq: Three times a day (TID) | ORAL | Status: DC
  Administered 2021-07-28 – 2021-07-30 (×5): 25 mg via ORAL
  Filled 2021-07-28 (×5): qty 1

## 2021-07-28 MED ORDER — FUROSEMIDE 10 MG/ML IJ SOLN
40.0000 mg | Freq: Once | INTRAMUSCULAR | Status: AC
Start: 2021-07-28 — End: 2021-07-28
  Administered 2021-07-28: 40 mg via INTRAVENOUS
  Filled 2021-07-28: qty 4

## 2021-07-28 MED ORDER — FUROSEMIDE 80 MG PO TABS
80.0000 mg | ORAL_TABLET | Freq: Every day | ORAL | Status: DC
  Administered 2021-07-29 – 2021-07-30 (×2): 80 mg via ORAL
  Filled 2021-07-28 (×2): qty 1

## 2021-07-28 MED ORDER — MIRTAZAPINE 30 MG PO TABS
30.0000 mg | ORAL_TABLET | Freq: Every day | ORAL | Status: DC
  Administered 2021-07-29: 30 mg via ORAL
  Filled 2021-07-28: qty 1

## 2021-07-28 MED ORDER — ACETAMINOPHEN 325 MG PO TABS: 650.0000 mg | ORAL_TABLET | Freq: Four times a day (QID) | ORAL | Status: DC | PRN

## 2021-07-28 MED ORDER — SODIUM CHLORIDE 0.9 % IV SOLN
10.0000 mL/h | Freq: Once | INTRAVENOUS | Status: AC
  Administered 2021-07-28: 10 mL/h via INTRAVENOUS

## 2021-07-28 MED ORDER — OMEGA-3-ACID ETHYL ESTERS 1 G PO CAPS
2.0000 g | ORAL_CAPSULE | Freq: Two times a day (BID) | ORAL | Status: DC
  Administered 2021-07-28 – 2021-07-30 (×4): 2 g via ORAL
  Filled 2021-07-28 (×4): qty 2

## 2021-07-28 MED ORDER — POTASSIUM CHLORIDE CRYS ER 20 MEQ PO TBCR
40.0000 meq | EXTENDED_RELEASE_TABLET | Freq: Once | ORAL | Status: AC
Start: 2021-07-28 — End: 2021-07-28
  Administered 2021-07-28: 40 meq via ORAL
  Filled 2021-07-28: qty 2

## 2021-07-28 MED ORDER — LORATADINE 10 MG PO TABS
10.0000 mg | ORAL_TABLET | Freq: Every day | ORAL | Status: DC
  Administered 2021-07-28 – 2021-07-30 (×3): 10 mg via ORAL
  Filled 2021-07-28 (×3): qty 1

## 2021-07-28 MED ORDER — THIAMINE HCL 100 MG PO TABS
100.0000 mg | ORAL_TABLET | Freq: Every day | ORAL | Status: DC
  Administered 2021-07-29 – 2021-07-30 (×2): 100 mg via ORAL
  Filled 2021-07-28 (×4): qty 1

## 2021-07-28 MED ORDER — INSULIN GLARGINE-YFGN 100 UNIT/ML ~~LOC~~ SOLN
90.0000 [IU] | Freq: Every day | SUBCUTANEOUS | Status: DC
  Administered 2021-07-28: 90 [IU] via SUBCUTANEOUS
  Filled 2021-07-28 (×2): qty 0.9

## 2021-07-28 MED ORDER — ACETAMINOPHEN 650 MG RE SUPP: 650.0000 mg | Freq: Four times a day (QID) | RECTAL | Status: DC | PRN

## 2021-07-28 NOTE — ED Triage Notes (Signed)
Pt presents to ED with complaints of diarrhea since Saturday. Pt states stool has been black.

## 2021-07-28 NOTE — ED Notes (Signed)
Report called to [facility]. Patient is ready for transport

## 2021-07-28 NOTE — H&P (Signed)
History and Physical    Patient: Jeffrey [name]:[id number] DOB: 1945-10-09 DOA: 07/28/2021 DOS: the patient was seen and examined on 07/28/2021 PCP: Monico Blitz, MD  Patient coming from: Home  Chief Complaint:  Chief Complaint  Patient presents with   Diarrhea    HPI: Jeffrey [name] is a 76 y.o. male with medical history significant of with history of C. difficile 6 years ago, atrial fibrillation, alcoholic cirrhosis, anemia, anxiety, cholelithiasis, B12 deficiency, diabetes mellitus type 2, peptic ulcer disease, GERD, hypertension, SBP, and more presents ED with chief complaint of diarrhea.  Patient reports that the diarrhea started 3 days ago.  He has been going 3-4 times per day.  He has been throwing up 4-5 times per day.  Anytime he tries to eat he feels nauseous.  He was taking Zofran, and it helped.  He took Imodium on Saturday, which helped temporarily.  He has had no blood in his stool or vomitus.  He reports associated left lower quadrant right lower quadrant cramping and sharp pain intermittently.  This pain did not present now but have been in the last few days.  Patient does still make urine, where the catheter bag.  Urine is dark yellow, he makes 300-600 mL/day.  Patient denies any fever or shortness of breath.  He does have a orthopnea but reports that that is normal for him.  Patient did have C. difficile 6 years ago and reports that this does not feel like that.  He is taking Levaquin prophylactically at dialysis.  Patient has 1 sick contact-his wife-she is diarrhea lasted for approximately 24 hours.  Patient has no other complaints.  Patient does not smoke, does not drink, does not use illicit drugs.  Patient is vaccinated for COVID.  Review of Systems: As mentioned in the history of present illness. All other systems reviewed and are negative. Past Medical History:  Diagnosis Date   A-fib [facility])    when initially stating dialysis january 2021 at [facility] per spouse;  never heard anything else about it   Alcoholic cirrhosis [facility])    patient reports completing hep a and b vaccines in 2006   Anemia    has had 3 units of prbcs 2013   Anxiety    Arthritis    Asymptomatic gallstones    Ultrasound in 2006   B12 deficiency 11/21/2020   C. difficile colitis    Cholelithiasis    Chronic kidney disease    Dialysis M/W/F/Sa   Depression    Diabetes ([place])    Duodenal ulcer with hemorrhage    per patient in 2006 or 2007, records have been requested   Dyspnea    low oxygen level -  has O2 2 L all day   Esophageal varices ([place])    see [facility]   GERD (gastroesophageal reflux disease)    Heart burn    History of alcohol abuse    quit 05/2013   HOH (hard of hearing)    Hypertension    Liver cirrhosis ([place])    ETOH   SBP (spontaneous bacterial peritonitis) ([place])    2020, November 2021.    Splenomegaly    Ultrasound in 2006   Past Surgical History:  Procedure Laterality Date   AGILE CAPSULE N/A 09/20/2019   Procedure: AGILE CAPSULE;  Surgeon: Daneil Dolin, MD;  [facility];  Service: Endoscopy;  Laterality: N/A;   APPENDECTOMY     AV FISTULA PLACEMENT Left 08/17/2019   Procedure: ARTERIOVENOUS (  AV) FISTULA CREATION VERSES ARTERIOVENOUS GRAFT;  Surgeon: [name], MD;  Location: [place];  Service: Vascular;  Laterality: Left;   BASCILIC VEIN TRANSPOSITION Left 09/28/2019   Procedure: LEFT SECOND STAGE [place];  Surgeon: [name], MD;  Location: [place];  Service: Vascular;  Laterality: Left;   BIOPSY  02/03/2018   Procedure: BIOPSY;  Surgeon: Daneil Dolin, MD;  [facility];  Service: Endoscopy;;  bx of gastric polyps   BIOPSY  09/19/2019   Procedure: BIOPSY;  Surgeon: Danie Binder, MD;  [facility];  Service: Endoscopy;;   BIOPSY  01/16/2021   Procedure: BIOPSY;  Surgeon: Daneil Dolin, MD;  [facility];  Service: Endoscopy;;   CATARACT EXTRACTION Right    COLONOSCOPY   03/10/2005   Rectal polyp as described above, removed with snare. Left sided  diverticula. The remainder of the colonic mucosa appeared normal. Inflammed polyp on path.   COLONOSCOPY  04/1999   Dr. Thea Silversmith polyps removed,  Path showed hyperplastic   COLONOSCOPY  10/2015   Dr. Britta Mccreedy: diverticulosis, single sessile tubular adenoma 3-54mm in size removed from descending colon.    COLONOSCOPY WITH ESOPHAGOGASTRODUODENOSCOPY (EGD)  03/31/2012   [id number] AVMs. Colonic diverticulosis. tubular adenoma colon   COLONOSCOPY WITH ESOPHAGOGASTRODUODENOSCOPY (EGD)  06/2019   FORSYTH: small esophageal varices without high risk stigmata, single large AVM on lesser curvature in gastric body s/p APC ablation, gastric antral and duodenal bulb polyposis. No significant source to explain transfusion dependent anemia. Colonoscopy with portal colopathy and diffuse edema, changes of Cdiff colitis on colonoscopy   COLONOSCOPY WITH PROPOFOL N/A 01/19/2021   Surgeon: [name], MD; 15 mm polyp in descending colon removed piecemeal, 6 mm polyp in ascending colon and 12 mm polyp in sigmoid colon not removed due to high bleeding risk, benign polypoid lesion at splenic flexure, diverticulosis in sigmoid and descending colon, rectal varices.   COLONOSCOPY WITH PROPOFOL N/A 01/16/2021   Surgeon: Daneil Dolin, MD;  Portal colopathy, large rectal varices, polypoid mass at splenic flexure biopsy, two 1 cm left colon polyps not removed, left-sided diverticulosis.  Reported patient had bleeding from splenic flexure lesion.  Pathology with acutely inflamed colonic mucosa with localized ulceration and prominent reactive changes.   ESOPHAGEAL BANDING N/A 02/03/2018   Procedure: ESOPHAGEAL BANDING;  Surgeon: Daneil Dolin, MD;  [facility];  Service: Endoscopy;  Laterality: N/A;   ESOPHAGEAL BANDING  01/16/2021   Procedure: ESOPHAGEAL BANDING;  Surgeon: Daneil Dolin, MD;  [facility];   Service: Endoscopy;;   ESOPHAGOGASTRODUODENOSCOPY  01/15/2005   Three columns grade 1 to 2 esophageal varices, otherwise normal esophageal mucosa.  Esophagus was not manipulated otherwise./Nodularity of the antrum with overlying erosions, nonspecific finding. Path showed rare H.pylori   ESOPHAGOGASTRODUODENOSCOPY  09/2008   Dr. Gaylene Brooks columns of grade 2-3 esoph varices, only one column was prominent. Portal gastropathy, multiple gastrc polyps at antrum, two were 2cm with black eschar, bulbar polyps, bulbar erosions   ESOPHAGOGASTRODUODENOSCOPY  11/2004   Dr. Brantley Stage 3 esoph varices   ESOPHAGOGASTRODUODENOSCOPY  03/31/2012   RMR: 4 columns(3-GR 2, 1-GR1) non-bleeding esophageal varices, portal gastropathy, small HH, early GAVE, multiple gastric polyps    ESOPHAGOGASTRODUODENOSCOPY (EGD) WITH PROPOFOL N/A 02/03/2018   Dr. Gala Romney: Esophageal varices, 3 columns grade 1-2.  Portal hypertensive gastropathy.  Multiple gastric polyps, biopsy consistent with hyperplastic.   ESOPHAGOGASTRODUODENOSCOPY (EGD) WITH PROPOFOL N/A 09/19/2019   Fields: grade I  and II esophageal varcies, mild portal hypertensive gastropathy, moderate gastritis but no H. pylori, single hyperplastic gastric polyp removed, obvious source for melena/transfusion dependent anemia not identified, may be due to friable gastric and duodenal mucosa in the setting of portal hypertension   ESOPHAGOGASTRODUODENOSCOPY (EGD) WITH PROPOFOL N/A 01/16/2021   Surgeon: Daneil Dolin, MD; Portal hypertensive gastropathy, grade 2 esophageal varices with bleeding stigmata s/p banding x4, slight nodularity and congestion involving bulb and second portion of duodenum, nonspecific.  Recommended repeat EGD in 4 weeks.   ESOPHAGOGASTRODUODENOSCOPY (EGD) WITH PROPOFOL N/A 05/22/2021   Procedure: ESOPHAGOGASTRODUODENOSCOPY (EGD) WITH PROPOFOL;  Surgeon: Daneil Dolin, MD;  [facility];  Service: Endoscopy;  Laterality: N/A;  8:15am,  dialysis patient   GASTRIC VARICES BANDING  03/31/2012   Procedure: GASTRIC VARICES BANDING;  Surgeon: Daneil Dolin, MD;  [facility];  Service: Endoscopy;  Laterality: N/A;   GIVENS CAPSULE STUDY N/A 09/21/2019   Poor study with a lot of debris obstructing much of the view of the first approximate 3 hours out of 6 hours.  No obvious source of bleeding identified.  Sequela of bleeding and old blood seen in the form of"whisps" of blood, blood flecks mostly toward the end of the study.   HEMOSTASIS CLIP PLACEMENT  01/19/2021   Procedure: HEMOSTASIS CLIP PLACEMENT;  Surgeon: [name], MD;  Location[facility] ENDOSCOPY;  Service: Gastroenterology;;   IR REMOVAL TUN CV CATH W/O FL  02/15/2020   POLYPECTOMY  09/19/2019   Procedure: POLYPECTOMY;  Surgeon: Danie Binder, MD;  [facility];  Service: Endoscopy;;   POLYPECTOMY  01/19/2021   Procedure: POLYPECTOMY;  Surgeon: [name], MD;  Location[facility] END[facility];  Service: Gastroenterology;;   UMBILICAL HERNIA REPAIR  2017   exploratory laparotomy, abdominal washout   Social History:  reports that he quit smoking about 35 years ago. His smoking use included cigarettes. He has a 50.00 pack-year smoking history. He has never used smokeless tobacco. He reports that he does not drink alcohol and does not use drugs.  Allergies  Allergen Reactions   Tape Itching, Rash and Other (See Comments)    Tears skin    Family History  Problem Relation Age of Onset   Colon cancer Father 22       deceased age 60   Breast cancer Sister        deceased   Diabetes Sister    Stroke Mother    Healthy Son    Healthy Daughter    Lung cancer Neg Hx    Ovarian cancer Neg Hx     Prior to Admission medications   Medication Sig Start Date End Date Taking? Authorizing Provider  acetaminophen (TYLENOL) 650 MG CR tablet Take 650 mg by mouth every 8 (eight) hours as needed for pain.   Yes [provider]  ALPRAZolam  Duanne Moron) 0.5 MG tablet Take 0.5 mg by mouth See admin instructions. Take one tablet (0.5 mg) by mouth on Monday, Wednesday, Friday before dialysis, may also take one tablet (0.5 mg) daily as needed for anxiety. 09/21/20  Yes [provider]  Ascorbic Acid (VITAMIN C) 1000 MG tablet Take 1,000 mg by mouth in the morning.   Yes [provider]  calcium carbonate (TUMS - DOSED IN MG ELEMENTAL CALCIUM) 500 MG chewable tablet Chew 1 tablet by mouth 3 (three) times daily with meals.   Yes [provider]  Calcium Carbonate-Simethicone ([place]) 750-80  MG CHEW Chew 1-2 tablets by mouth 2 (two) times daily as needed (acid reflux).   Yes [provider]  cetirizine (ZYRTEC) 10 MG tablet Take 10 mg by mouth daily. 06/24/21  Yes [provider]  cholecalciferol (VITAMIN D3) 25 MCG (1000 UNIT) tablet Take 1,000 Units by mouth daily.   Yes [provider]  diltiazem (TIAZAC) 120 MG 24 hr capsule Take 120 mg by mouth daily. 05/28/20  Yes [provider]  furosemide (LASIX) 40 MG tablet Take 80 mg by mouth daily. 12/29/19  Yes [provider]  gabapentin (NEURONTIN) 100 MG capsule TAKE 1 CAPSULE BY MOUTH AT BEDTIME Patient taking differently: Take 100 mg by mouth 3 (three) times daily. 05/28/20  Yes Waynetta Sandy, MD  Garlic 6295 MG CAPS Take 1,000 mg by mouth daily.   Yes [provider]  HUMALOG KWIKPEN 100 UNIT/ML KwikPen 10-50 Units 3 (three) times daily. Sliding scale 04/23/21  Yes [provider]  hydrOXYzine (ATARAX/VISTARIL) 25 MG tablet Take 25 mg by mouth 3 (three) times daily. For itching 11/23/20  Yes [provider]  iron polysaccharides (NIFEREX) 150 MG capsule Take 150 mg by mouth daily with supper.   Yes [provider]  lactulose (CONSTULOSE) 10 GM/15ML solution TAKE 45 MLS EVERY MORNING AND TAKE 45 MLS EVERY EVENING Patient taking differently: Take 30 g by mouth 2  (two) times daily as needed for mild constipation. 07/17/21  Yes Mahala Menghini, PA-C  levofloxacin (LEVAQUIN) 500 MG tablet TAKE 1 TABLET BY MOUTH THREE TIMES EVERY WEEK AFTER DIALYSIS Patient taking differently: Take 500 mg by mouth 3 (three) times a week. AFTER DIALYSIS 03/14/21  Yes [name] S, PA-C  lidocaine-prilocaine (EMLA) cream Apply 2 application topically daily. 06/11/21  Yes [provider]  metoprolol succinate (TOPROL-XL) 25 MG 24 hr tablet Take 1 tablet (25 mg total) by mouth See admin instructions. Take one tablet (25 mg) by mouth at bedtime every night and take one tablet (25 mg) in morning on  Sun, Tues, Thurs and Sat (non-dialysis days) 04/15/21  Yes Johnson, Clanford L, MD  midodrine (PROAMATINE) 10 MG tablet Take 1 tablet (10 mg total) by mouth 2 (two) times daily with a meal. 04/06/20  Yes Tat, Shanon Brow, MD  milk thistle 175 MG tablet Take 175 mg by mouth 3 (three) times daily.    Yes [provider]  mirtazapine (REMERON) 30 MG tablet Take 1 tablet (30 mg total) by mouth at bedtime. Patient taking differently: Take 30 mg by mouth daily with supper. 02/10/20  Yes Roxan Hockey, MD  Multiple Vitamin (MULTIVITAMIN WITH MINERALS) TABS tablet Take 1 tablet by mouth in the morning.    Yes [provider]  omega-3 acid ethyl esters (LOVAZA) 1 g capsule Take 2 g by mouth 2 (two) times daily.  04/07/19  Yes [provider]  ondansetron (ZOFRAN) 4 MG tablet Take 4 mg by mouth every 8 (eight) hours as needed for nausea or vomiting.   Yes [provider]  oxyCODONE-acetaminophen (PERCOCET/ROXICET) 5-325 MG tablet Take 1 tablet by mouth every 4 (four) hours as needed for moderate pain. 02/27/20  Yes [provider]  pantoprazole (PROTONIX) 40 MG tablet Take 2 tablets (80 mg total) by mouth daily. 01/21/21  Yes Shelly Coss, MD  Ped Vitamins ACD Fl-Iron (TRI-VIT/FLUORIDE/IRON PO) Take 1 tablet by mouth daily. 08/01/19  Yes [provider]  pravastatin (PRAVACHOL) 20 MG tablet Take 20 mg by mouth in the morning.  Yes [provider]  Probiotic Product (PROBIOTIC DAILY PO) Take 1 tablet by mouth in the morning.    Yes [provider]  spironolactone (ALDACTONE) 50 MG tablet Take 50 mg by mouth in the morning.    Yes [provider]  thiamine (VITAMIN B-1) 100 MG tablet Take 100 mg by mouth daily. 08/27/20  Yes [provider]  tiZANidine (ZANAFLEX) 2 MG tablet Take 2 mg by mouth 2 (two) times daily as needed for muscle spasms. 02/29/20  Yes [provider]  TRESIBA FLEXTOUCH 200 UNIT/ML FlexTouch Pen Inject 120 Units into the skin at bedtime. 04/22/21  Yes [provider]  triamcinolone cream (KENALOG) 0.1 % Apply 1 application topically 2 (two) times daily as needed (dry skin Itching). 12/30/17  Yes [provider]  venlafaxine (EFFEXOR) 75 MG tablet Take 75 mg by mouth 3 (three) times daily with meals.    Yes [provider]  XIFAXAN 550 MG TABS tablet TAKE 1 TABLET BY MOUTH TWICE DAILY Patient taking differently: Take 550 mg by mouth 2 (two) times daily. 02/13/21  Yes Mahala Menghini, PA-C  Alcohol Swabs (ALCOHOL PREP) 70 % PADS Apply 1 each topically 3 (three) times daily. 01/31/20   [provider]  BD PEN NEEDLE MICRO U/F 32G X 6 MM MISC SMARTSIG:SUB-Q Daily PRN 04/23/21   [provider]  GLUCAGEN HYPOKIT 1 MG SOLR injection Inject 1 mg into the muscle once as needed for low blood sugar. 11/28/20   [provider]  hydrocortisone (ANUSOL-HC) 2.5 % rectal cream Place 1 application rectally 2 (two) times daily as needed for hemorrhoids or anal itching. Patient not taking: Reported on 07/28/2021 04/15/21   Murlean Iba, MD    Physical Exam: Vitals:   07/28/21 1700 07/28/21 1815 07/28/21 1927 07/28/21 2011  BP: (!) 164/67  (!) 175/70   Pulse: 81 84    Resp: 19 16 (!) 25   Temp:   98.3 F (36.8 C)   TempSrc:    Oral   SpO2: 100% 100% 100%   Weight:    82.3 kg  Height:    '5\' 7"'$  (1.702 m)   1.  General: Patient sitting on bedside commode no acute distress   2. Psychiatric: Alert and oriented x 3, mood and behavior normal for situation, pleasant and cooperative with exam   3. Neurologic: Speech and language are normal, face is symmetric, moves all 4 extremities voluntarily, at baseline without acute deficits on limited exam   4. HEENMT:  Head is atraumatic, normocephalic, pupils reactive to light, neck is supple, trachea is midline, mucous membranes are moist   5. Respiratory : Lungs are diminished in the lower lung fields, but otherwise clear to auscultation bilaterally without wheezing, rhonchi, rales, no cyanosis, no increase in work of breathing or accessory muscle use   6. Cardiovascular : Heart rate normal, rhythm is regular, no murmurs, rubs or gallops, no peripheral edema, peripheral pulses palpated   7. Gastrointestinal:  Abdomen is soft, nondistended, nontender to palpation bowel sounds active, no masses or organomegaly palpated   8. Skin:  Skin is warm, dry and intact firm small bumps just lateral to the olecranon fossa, no erythema, no excoriations, chronic-present for over a year   9.Musculoskeletal:  No acute deformities or trauma, no asymmetry in tone, no peripheral edema, peripheral pulses palpated, no tenderness to palpation in the extremities   Data Reviewed: In ED Temp 98.1, heart rate 16-19, blood pressure 151/64, satting at  98% on 3 L nasal cannula-baseline oxygen requirement Patient was sent to the emergency room by GI for watery diarrhea, black stools, vomiting, lightheadedness, dizziness according to GI note Patient is a pancytopenia with a leukopenia 1.6, anemia 6.6, thrombocytopenia 51 Chemistry panel reveals a slight hypokalemia 3.4, elevated creatinine 4.06, decreased bicarb 21 Decrease magnesium 1.5 Alk phos 137 Albumin 2.7 Chest x-ray shows cardiomegaly,  pleural effusions, left basilar atelectasis.  Mild compressive atelectasis of right lung base 1 unit transfusion ordered in the ED C. 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 1 dose of IV Lasix Wait for dialysis to do any further transfusions  Hypomagnesemia- (present on admission) Magnesium given in the ED Defer to nephrology for further replacement  Hypokalemia- (present on admission) 40 mEq potassium given in the ED Defer to nephrology for further replacement  Hypoalbuminemia- (present on admission) Albumin 2.7 likely secondary to p.o. intake and liver cirrhosis  encourage nutrient dense food choices Continue to monitor  ESRD on dialysis [facility]) Consult nephrology Plan for dialysis in the a.m. Patient is currently on his baseline oxygen requirement, has pleural effusions on chest x-ray, no emergent electrolyte abnormalities, mental status is at baseline, continue to monitor  Type 2 diabetes mellitus with stage 3 chronic kidney disease, with long-term current use of insulin ([place]) Patient takes 120 units of insulin at home Reduced dose while in the hospital Sliding scale coverage Update hemoglobin A1c Continue to monitor  Diarrhea- (present on admission) Patient with history of C. Difficile -C. difficile pending, continue seizure precautions And GI pathogen panel at admission After sample was produced, will start Imodium Hypokalemia is likely secondary to GI loss, see above FOBT negative Continue to monitor  Liver cirrhosis ([place])- (present on admission) Continue Xifaxan Continue to monitor       Advance Care Planning:   Code Status: Full Code   Consults:  Nephrology  Family Communication: No family at bedside  Severity of Illness: The appropriate patient status for this patient is OBSERVATION. Observation status is judged to be reasonable and necessary in order to provide the required intensity of service to ensure the patient's safety. The patient's presenting symptoms, physical exam findings, and initial radiographic and laboratory data in the context of their medical condition is felt to place them at decreased risk for further clinical deterioration. Furthermore, it is anticipated that the patient will be medically stable for discharge from the hospital within 2 midnights of admission.   Author: Rolla Plate, DO 07/28/2021 9:57 PM  For on call review [email or web address].

## 2021-07-28 NOTE — Assessment & Plan Note (Addendum)
-  Repleted/replace -Continue to follow electrolytes and further hemodialysis stabilization.

## 2021-07-28 NOTE — Telephone Encounter (Signed)
See separate telephone note.

## 2021-07-28 NOTE — Assessment & Plan Note (Addendum)
-  Albumin 2.7 likely secondary to p.o. intake and liver cirrhosis  encourage nutrient dense food choices -Continue to monitor

## 2021-07-28 NOTE — Assessment & Plan Note (Addendum)
-  Patient with history of C. Difficile -and C. difficile is positive -Discussed with GI and vancomycin initiated x10 days -Continue to maintain adequate hydration and follow/replete electrolytes as needed. -PPI has been discontinued -Okay to continue the use of probiotic -Pepcid has been used instead of Protonix while actively treating C. difficile infection. -Continue outpatient follow-up with gastroenterology service.

## 2021-07-28 NOTE — Assessment & Plan Note (Addendum)
-  Appreciate nephrology evaluation and recommendations -Continue outpatient hemodialysis as previously scheduled. -IV iron infusion and Epogen therapy as per nephrology discretion.

## 2021-07-28 NOTE — Assessment & Plan Note (Addendum)
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 -Fecal occult blood test negative -Patient regularly gets transfusions approximately every 1 to 2 months -Continue to follow hemoglobin trend and transfuse for hemoglobin less than 7.

## 2021-07-28 NOTE — Progress Notes (Signed)
Notified of Pt's admission for blood transfusion.  Due to timing and staffing issues will plan on HD tomorrow morning.

## 2021-07-28 NOTE — Assessment & Plan Note (Addendum)
Patient takes 120 units of insulin at home Reduced dose while in the hospital down to 80 units per diabetes coordinator recommendations Sliding scale coverage A1c 5.5% Continue to monitor

## 2021-07-28 NOTE — Assessment & Plan Note (Addendum)
-  Continue Xifaxan -Continue home management with Lasix and spironolactone (patient is still producing urine) -Continue outpatient follow-up with gastroenterology service -In the setting of acute C. difficile infection patient's Levaquin SBP prophylaxis will be discontinued. -If further prophylaxis needed down the road we will recommend the use of a different antibiotics to minimize chances of C.Diff reactivation/recurrent infection.

## 2021-07-28 NOTE — ED Provider Notes (Signed)
[facility] EMERGENCY DEPARTMENT Provider Note   CSN: [id number] Arrival date & time: 07/28/21  1157     History  Chief Complaint  Patient presents with   Diarrhea    Jeffrey [name] is a 76 y.o. male.  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  Patient states diarrhea started on Saturday, states he has had multiple episodes of diarrhea daily, he states that they look dark and tarry, he denies any hematochezia,  intermittent stomach pains, none at this moment, no nausea or vomiting, no fevers or chills, chest pain shortness breath or general body aches.  Patient states that he got his dialysis treatment on Friday but not today, he denies recent NSAID use or alcohol use, has history of C. difficile currently on Levaquin 3 times a week after dialysis treatment, no recent hospitalizations.  Patient is no other complaints at this time.  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  He is also being followed for pancytopenia which they suspect is secondary due to liver cirrhosis as well as end-stage renal disease.  Home Medications Prior to Admission medications   Medication Sig Start Date End Date Taking? Authorizing Provider  acetaminophen (TYLENOL) 650 MG CR tablet Take 650 mg by mouth every 8 (eight) hours as needed for pain.   Yes [provider]  ALPRAZolam Duanne Moron) 0.5 MG tablet Take 0.5 mg by mouth See admin instructions. Take one tablet (0.5 mg) by mouth on Monday, Wednesday, Friday before dialysis, may also take one tablet (0.5 mg) daily as needed for anxiety. 09/21/20  Yes [provider]   Ascorbic Acid (VITAMIN C) 1000 MG tablet Take 1,000 mg by mouth in the morning.   Yes [provider]  calcium carbonate (TUMS - DOSED IN MG ELEMENTAL CALCIUM) 500 MG chewable tablet Chew 1 tablet by mouth 3 (three) times daily with meals.   Yes [provider]  Calcium Carbonate-Simethicone (ALKA-SELTZER HEARTBURN + GAS) 750-80 MG CHEW Chew 1-2 tablets by mouth 2 (two) times daily as needed (acid reflux).   Yes [provider]  cetirizine (ZYRTEC) 10 MG tablet Take 10 mg by mouth daily. 06/24/21  Yes [provider]  cholecalciferol (VITAMIN D3) 25 MCG (1000 UNIT) tablet Take 1,000 Units by mouth daily.   Yes [provider]  diltiazem (TIAZAC) 120 MG 24 hr capsule Take 120 mg by mouth daily. 05/28/20  Yes [provider]  furosemide (LASIX) 40 MG tablet Take 80 mg by mouth daily. 12/29/19  Yes [provider]  gabapentin (NEURONTIN) 100 MG capsule TAKE 1 CAPSULE BY MOUTH AT BEDTIME Patient taking differently: Take 100 mg by mouth 3 (three) times daily. 05/28/20  Yes Waynetta Sandy, MD  Garlic 2919 MG CAPS Take 1,000 mg by mouth daily.   Yes [provider]  HUMALOG KWIKPEN 100 UNIT/ML KwikPen 10-50 Units 3 (three) times daily. Sliding scale 04/23/21  Yes [provider]  hydrOXYzine (ATARAX/VISTARIL) 25 MG tablet Take 25 mg by mouth 3 (three) times daily. For itching 11/23/20  Yes [provider]  iron polysaccharides (NIFEREX) 150 MG capsule Take 150 mg by mouth daily with supper.   Yes [provider]  lactulose (CONSTULOSE) 10 GM/15ML solution TAKE 45 MLS EVERY MORNING AND TAKE 45 MLS EVERY EVENING Patient taking differently: Take 30 g by mouth 2 (two) times daily as needed for mild constipation. 07/17/21  Yes Mahala Menghini, PA-C  levofloxacin (LEVAQUIN) 500 MG tablet TAKE 1 TABLET BY MOUTH THREE TIMES EVERY WEEK AFTER DIALYSIS Patient taking differently: Take 500 mg by mouth 3 (three)  times a week. AFTER DIALYSIS 03/14/21  Yes [name] S, PA-C  lidocaine-prilocaine (EMLA) cream Apply 2 application topically daily. 06/11/21  Yes [provider]  metoprolol succinate (TOPROL-XL) 25 MG 24 hr tablet Take 1 tablet (25 mg total) by mouth See admin instructions. Take one tablet (25 mg) by mouth at bedtime every night and take one tablet (25 mg) in morning on  Sun, Tues, Thurs and Sat (non-dialysis days) 04/15/21  Yes Johnson, Clanford L, MD  midodrine (PROAMATINE) 10 MG tablet Take 1 tablet (10 mg total) by mouth 2 (two) times daily with a meal. 04/06/20  Yes Tat, Shanon Brow, MD  milk thistle 175 MG tablet Take 175 mg by mouth 3 (three) times daily.    Yes [provider]  mirtazapine (REMERON) 30 MG tablet Take 1 tablet (30 mg total) by mouth at bedtime. Patient taking differently: Take 30 mg by mouth daily with supper. 02/10/20  Yes Roxan Hockey, MD  Multiple Vitamin (MULTIVITAMIN WITH MINERALS) TABS tablet Take 1 tablet by mouth in the morning.    Yes [provider]  omega-3 acid ethyl esters (LOVAZA) 1 g capsule Take 2 g by mouth 2 (two) times daily.  04/07/19  Yes [provider]  ondansetron (ZOFRAN) 4 MG tablet Take 4 mg by mouth every 8 (eight) hours as needed for nausea or vomiting.   Yes [provider]  oxyCODONE-acetaminophen (PERCOCET/ROXICET) 5-325 MG tablet Take 1 tablet by mouth every 4 (four) hours as needed for moderate pain. 02/27/20  Yes [provider]  pantoprazole (PROTONIX) 40 MG tablet Take 2 tablets (80 mg total) by mouth daily. 01/21/21  Yes Shelly Coss, MD  Ped Vitamins ACD Fl-Iron (TRI-VIT/FLUORIDE/IRON PO) Take 1 tablet by mouth daily. 08/01/19  Yes [provider]  pravastatin (PRAVACHOL) 20 MG tablet Take 20 mg by mouth in the morning.    Yes [provider]  Probiotic Product (PROBIOTIC DAILY PO) Take 1 tablet by mouth in the morning.    Yes [provider]  spironolactone  (ALDACTONE) 50 MG tablet Take 50 mg by mouth in the morning.    Yes [provider]  thiamine (VITAMIN B-1) 100 MG tablet Take 100 mg by mouth daily. 08/27/20  Yes [provider]  tiZANidine (ZANAFLEX) 2 MG tablet Take 2 mg by mouth 2 (two) times daily as needed for muscle spasms. 02/29/20  Yes [provider]  TRESIBA FLEXTOUCH 200 UNIT/ML FlexTouch Pen Inject 120 Units into the skin at bedtime. 04/22/21  Yes [provider]  triamcinolone cream (KENALOG) 0.1 % Apply 1 application topically 2 (two) times daily as needed (dry skin Itching). 12/30/17  Yes [provider]  venlafaxine (EFFEXOR) 75 MG tablet Take 75 mg by mouth 3 (three) times daily with meals.    Yes [provider]  XIFAXAN 550 MG TABS tablet TAKE 1 TABLET BY MOUTH TWICE DAILY Patient taking differently: Take 550 mg by mouth 2 (two) times daily. 02/13/21  Yes Mahala Menghini, PA-C  Alcohol Swabs (ALCOHOL PREP) 70 % PADS Apply 1 each topically 3 (three)  times daily. 01/31/20   [provider]  BD PEN NEEDLE MICRO U/F 32G X 6 MM MISC SMARTSIG:SUB-Q Daily PRN 04/23/21   [provider]  GLUCAGEN HYPOKIT 1 MG SOLR injection Inject 1 mg into the muscle once as needed for low blood sugar. 11/28/20   [provider]  hydrocortisone (ANUSOL-HC) 2.5 % rectal cream Place 1 application rectally 2 (two) times daily as needed for hemorrhoids or anal itching. Patient not taking: Reported on 07/28/2021 04/15/21   Murlean Iba, MD      Allergies    Tape    Review of Systems   Review of Systems  Constitutional:  Negative for chills and fever.  Respiratory:  Negative for shortness of breath.   Cardiovascular:  Negative for chest pain.  Gastrointestinal:  Positive for diarrhea. Negative for abdominal pain, constipation and vomiting.  Neurological:  Negative for headaches.   Physical Exam Updated Vital Signs BP (!) 162/68    Pulse 87    Temp 98.1 F (36.7 C)  (Oral)    Resp 16    Ht 5' 7"  (1.702 m)    Wt 84.8 kg    SpO2 100%    BMI 29.29 kg/m  Physical Exam Vitals and nursing note reviewed.  Constitutional:      General: He is not in acute distress.    Appearance: He is not ill-appearing.  HENT:     Head: Normocephalic and atraumatic.     Nose: No congestion.  Eyes:     Conjunctiva/sclera: Conjunctivae normal.  Cardiovascular:     Rate and Rhythm: Normal rate and regular rhythm.     Pulses: Normal pulses.     Heart sounds: No murmur heard.   No friction rub. No gallop.  Pulmonary:     Effort: No respiratory distress.     Breath sounds: Rales present. No wheezing or rhonchi.     Comments: Slight bibasilar Rales Abdominal:     General: There is distension.     Palpations: Abdomen is soft.     Tenderness: There is no abdominal tenderness. There is no right CVA tenderness or left CVA tenderness.     Comments: Abdomen appears to be distended on my exam but dull to percussion, no fluid wave, nontender, no guarding, rebound touch, peritoneal sign.  Musculoskeletal:     Right lower leg: No edema.     Left lower leg: No edema.  Skin:    General: Skin is warm and dry.     Comments: Fistula in the left AC, good palpable thrill no overlying skin changes.  Neurological:     Mental Status: He is alert.  Psychiatric:        Mood and Affect: Mood normal.    ED Results / Procedures / Treatments   Labs (all labs ordered are listed, but only abnormal results are displayed) Labs Reviewed  COMPREHENSIVE METABOLIC PANEL - Abnormal; Notable for the following components:      Result Value   Sodium 131 (*)    Potassium 3.1 (*)    CO2 21 (*)    Glucose, Bld 191 (*)    BUN 38 (*)    Creatinine, Ser 4.06 (*)    Calcium 8.5 (*)    Total Protein 6.3 (*)    Albumin 2.7 (*)    Alkaline Phosphatase 137 (*)    GFR, Estimated 15 (*)    All other components within normal limits  CBC WITH DIFFERENTIAL/PLATELET - Abnormal; Notable for the following  components:   WBC 1.6 (*)    RBC 2.18 (*)    Hemoglobin 6.6 (*)    HCT 21.3 (*)    RDW 19.0 (*)    Platelets 51 (*)    Neutro Abs 1.3 (*)    Lymphs Abs 0.1 (*)    All other components within normal limits  MAGNESIUM - Abnormal; Notable for the following components:   Magnesium 1.5 (*)    All other components within normal limits  C DIFFICILE QUICK SCREEN W PCR REFLEX    RESP PANEL BY RT-PCR (FLU A&B, COVID) ARPGX2  LIPASE, BLOOD  POC OCCULT BLOOD, ED  TYPE AND SCREEN  PREPARE RBC (CROSSMATCH)    EKG None  Radiology DG Chest Port 1 View  Result Date: 07/28/2021 CLINICAL DATA:  Provided history: Bibasilar crackles. Intermittent shortness of breath. Diarrhea. History of atrial fibrillation, cirrhosis, former smoker. EXAM: PORTABLE CHEST 1 VIEW COMPARISON:  CT angiogram chest 04/01/2020. Prior chest radiographs 04/01/2020 and earlier. FINDINGS: Cardiomegaly. Small bilateral pleural effusions. Superimposed opacity at the left lung base, which may reflect atelectasis and/or pneumonia. Mild compressive atelectasis at the right lung base. No evidence of pneumothorax. No acute bony abnormality identified. IMPRESSION: Cardiomegaly. Small bilateral pleural effusions. Superimposed left basilar atelectasis and/or pneumonia. Mild compressive atelectasis at the right lung base. Electronically Signed   By: Kellie Simmering D.O.   On: 07/28/2021 13:47    Procedures .Critical Care Performed by: Marcello Fennel, PA-C Authorized by: Marcello Fennel, PA-C   Critical care provider statement:    Critical care time (minutes):  30   Critical care time was exclusive of:  Separately billable procedures and treating other patients   Critical care was necessary to treat or prevent imminent or life-threatening deterioration of the following conditions:  Circulatory failure   Critical care was time spent personally by me on the following activities:  Discussions with consultants, evaluation of patient's  response to treatment, examination of patient, ordering and review of laboratory studies, ordering and review of radiographic studies, ordering and performing treatments and interventions, pulse oximetry, re-evaluation of patient's condition and review of old charts   I assumed direction of critical care for this patient from another provider in my specialty: no     Care discussed with: admitting provider      Medications Ordered in ED Medications  0.9 %  sodium chloride infusion (has no administration in time range)  magnesium oxide (MAG-OX) tablet 400 mg (has no administration in time range)  potassium chloride SA (KLOR-CON M) CR tablet 40 mEq (has no administration in time range)    ED Course/ Medical Decision Making/ A&P Clinical Course as of 07/28/21 1529  Mon Jul 28, 2021  1350 This is a 76 year old male with a history of end-stage renal disease on dialysis, history of C. difficile, history of chronic antibiotic use after dialysis, presenting to the ED with profuse watery diarrhea for the past 2 days, missed dialysis today.  His wife reports that she had a "stomach bug" last week.  The patient had some vomiting on Saturday and poor appetite since, mostly just watery diarrhea.  No fevers at home.  He denies shortness of breath.  Labs today are notable for mild hypokalemia 3.1, creatinine at baseline, CBC showing acute on chronic anemia, today 6.6.  He received a blood transfusion 2 weeks ago and gets these routinely for intermittent anemia.  His magnesium is also low at 1.5.  On exam he appears comfortable, no abdominal tenderness  or significant distention or guarding.  Lower suspicion for SBP, GI perforation, acute abdomen.  Anticipate he would benefit from a transfusion we will start with 1 unit of blood given that he has missed dialysis.  We are also awaiting C. difficile testing.  However given the constellation of symptoms and clinical concerns, including anemia, dehydration, missed  dialysis, possible C. difficile, and given his comorbidities, I do think that he would benefit from hospitalization at this time.  The patient and his wife are in agreement [MT]    Clinical Course User Index [MT] Trifan, Carola Rhine, MD                           Medical Decision Making Amount and/or Complexity of Data Reviewed Labs: ordered. Radiology: ordered.  Risk OTC drugs. Prescription drug management. Decision regarding hospitalization.   This patient presents to the ED for concern of diarrhea, this involves an extensive number of treatment options, and is a complaint that carries with it a high risk of complications and morbidity.  The differential diagnosis includes C. difficile, spontaneous bacterial peritonitis, bowel obstruction, emergent hemodialysis sepsis    Additional history obtained:  Additional history obtained from electronic medical record External records from outside source obtained and reviewed including CT HPI for further detail   Co morbidities that complicate the patient evaluation  Liver cirrhosis, end-stage renal disease  Social Determinants of Health:  N/A    Lab Tests:  I Ordered, and personally interpreted labs.  The pertinent results include: CBC shows pancytopenia leukocytosis of 1.6, normocytic anemia hemoglobin of 6.6, platelets 51, CMP shows sodium 131 potassium 3.1 CO2 of 21 glucose 191 BUN 38 creatinine 4 alk phos 137 magnesium 1.5 lipase 22, Hemoccult negative   Imaging Studies ordered:  I ordered imaging studies including chest x-ray I independently visualized and interpreted imaging which showed small bilateral effusions with atelectasis. I agree with the radiologist interpretation   Cardiac Monitoring:  The patient was maintained on a cardiac monitor.  I personally viewed and interpreted the cardiac monitored which showed an underlying rhythm of: EKG sinus without signs of ischemia   Medicines ordered and prescription drug  management:  I ordered medication including potassium, magnesium for electrolyte derailments I have reviewed the patients home medicines and have made adjustments as needed  Critical Interventions:  Hemoglobin of 6.6 will provide patient with transfusion   Reevaluation:  Presents with complaints of diarrhea and dark tarry stools obtain Hemoccult which was negative.  We will continue to monitor   Patient has slight electrolyte derailments, will prime with oral potassium and magnesium, also note that he has a decreased hemoglobin this is likely multifactorial provide him with a blood transfusion.  Patient is reassessed she is tolerating this well, vital signs remained stable, due to his increased risk factors of dehydration from consistent diarrhea will recommend admission patient room this plan will consult hospitalist team.  Consultations Obtained:  I requested consultation with the hospitalist Dr. Nori Riis,  and discussed lab and imaging findings as well as pertinent plan - they recommend: will admit the patient's    Test Considered:  CT abdomen pelvis but will defer as abdomen soft nontender to palpation, no surgical abdomen present, he is afebrile nontachycardic I have low suspicion intra-abdominal abnormality at this time.    Rule out I have low suspicion for GI bleed as he has a negative Hemoccult, he does have a slightly decreased hemoglobin but this appears to  be his baseline, likely multifactorial from chronic diseases.  I have low suspicion for SBP as he is afebrile nontachycardic no leukocytosis abdomen soft nontender to palpation.  I have low suspicion for bowel obstruction and or volvulus that she still passing gas and having normal bowel movements no nausea or vomiting abdomen soft nontender.  I have low suspicion for emergent dialysis as he has no new oxygen requirements, no significant electrolyte derailments, no anion gap, no significant discrepancy in  CO2.    Dispostion and problem list  After consideration of the diagnostic results and the patients response to treatment, I feel that the patent would benefit from admission.  Diarrhea-unclear urology but likely viral will recommend obtaining C. difficile for rule outs. Normocytic anemia-likely acute on chronic will recommend continued transfusion and monitoring.             Final Clinical Impression(s) / ED Diagnoses Final diagnoses:  Bibasilar crackles  Microcytic anemia  Diarrhea in adult patient    Rx / DC Orders ED Discharge Orders     None         Marcello Fennel, PA-C 07/28/21 1628    Wyvonnia Dusky, MD 07/28/21 1734

## 2021-07-28 NOTE — ED Notes (Signed)
Placed pt on 3L [place] per pt request. Pt states he wears 3L PRN.

## 2021-07-28 NOTE — Assessment & Plan Note (Addendum)
-  Repleted -Continue to follow electrolytes stability.

## 2021-07-28 NOTE — Telephone Encounter (Signed)
Spoke with patient's wife this morning. Patient has been having significant watery diarrhea since Saturday, Bms every 1-2 hours. Reports stools are black. Also had some vomiting on Saturday, but this has resolved. No abdominal pain or fever. Wife had similar symptoms on Thursday, but resolved within 24 hours. Jeffrey [name] also has associated lightheadedness, dizziness, and feels weak stating there is no way he would be able to go to dialysis today.   I have recommended evaluation in the emergency room. Patient wife states they will head to [facility] shortly.

## 2021-07-29 ENCOUNTER — Ambulatory Visit: Payer: Medicare Other

## 2021-07-29 DIAGNOSIS — D649 Anemia, unspecified: Secondary | ICD-10-CM | POA: Diagnosis not present

## 2021-07-29 LAB — CLOSTRIDIUM DIFFICILE BY PCR, REFLEXED: Toxigenic C. Difficile by PCR: POSITIVE — AB

## 2021-07-29 LAB — COMPREHENSIVE METABOLIC PANEL
ALT: 17 U/L (ref 0–44)
AST: 20 U/L (ref 15–41)
Albumin: 2.6 g/dL — ABNORMAL LOW (ref 3.5–5.0)
Alkaline Phosphatase: 128 U/L — ABNORMAL HIGH (ref 38–126)
Anion gap: 10 (ref 5–15)
BUN: 41 mg/dL — ABNORMAL HIGH (ref 8–23)
CO2: 24 mmol/L (ref 22–32)
Calcium: 8.8 mg/dL — ABNORMAL LOW (ref 8.9–10.3)
Chloride: 99 mmol/L (ref 98–111)
Creatinine, Ser: 4.32 mg/dL — ABNORMAL HIGH (ref 0.61–1.24)
GFR, Estimated: 14 mL/min — ABNORMAL LOW (ref >60)
Glucose, Bld: 96 mg/dL (ref 70–99)
Potassium: 3.4 mmol/L — ABNORMAL LOW (ref 3.5–5.1)
Sodium: 133 mmol/L — ABNORMAL LOW (ref 135–145)
Total Bilirubin: 0.5 mg/dL (ref 0.3–1.2)
Total Protein: 6.1 g/dL — ABNORMAL LOW (ref 6.5–8.1)

## 2021-07-29 LAB — URINALYSIS, ROUTINE W REFLEX MICROSCOPIC
Bilirubin Urine: NEGATIVE
Glucose, UA: NEGATIVE mg/dL
Ketones, ur: 5 mg/dL — AB
Nitrite: NEGATIVE
Protein, ur: >=300 mg/dL — AB
Specific Gravity, Urine: 1.014 (ref 1.005–1.030)
pH: 5 (ref 5.0–8.0)

## 2021-07-29 LAB — GASTROINTESTINAL PANEL BY PCR, STOOL (REPLACES STOOL CULTURE)

## 2021-07-29 LAB — CBC WITH DIFFERENTIAL/PLATELET
Abs Immature Granulocytes: 0 10*3/uL (ref 0.00–0.07)
Basophils Absolute: 0 10*3/uL (ref 0.0–0.1)
Basophils Relative: 1 %
Eosinophils Absolute: 0.1 10*3/uL (ref 0.0–0.5)
Eosinophils Relative: 5 %
HCT: 24.5 % — ABNORMAL LOW (ref 39.0–52.0)
Hemoglobin: 7.8 g/dL — ABNORMAL LOW (ref 13.0–17.0)
Immature Granulocytes: 0 %
Lymphocytes Relative: 9 %
Lymphs Abs: 0.2 10*3/uL — ABNORMAL LOW (ref 0.7–4.0)
MCH: 31.2 pg (ref 26.0–34.0)
MCHC: 31.8 g/dL (ref 30.0–36.0)
MCV: 98 fL (ref 80.0–100.0)
Monocytes Absolute: 0.2 10*3/uL (ref 0.1–1.0)
Monocytes Relative: 9 %
Neutro Abs: 1.6 10*3/uL — ABNORMAL LOW (ref 1.7–7.7)
Neutrophils Relative %: 76 %
Platelets: 58 10*3/uL — ABNORMAL LOW (ref 150–400)
RBC: 2.5 MIL/uL — ABNORMAL LOW (ref 4.22–5.81)
RDW: 18.6 % — ABNORMAL HIGH (ref 11.5–15.5)
WBC: 2.1 10*3/uL — ABNORMAL LOW (ref 4.0–10.5)
nRBC: 0 % (ref 0.0–0.2)

## 2021-07-29 LAB — MAGNESIUM: Magnesium: 1.6 mg/dL — ABNORMAL LOW (ref 1.7–2.4)

## 2021-07-29 LAB — GLUCOSE, CAPILLARY
Glucose-Capillary: 63 mg/dL — ABNORMAL LOW (ref 70–99)
Glucose-Capillary: 63 mg/dL — ABNORMAL LOW (ref 70–99)
Glucose-Capillary: 69 mg/dL — ABNORMAL LOW (ref 70–99)
Glucose-Capillary: 85 mg/dL (ref 70–99)
Glucose-Capillary: 92 mg/dL (ref 70–99)

## 2021-07-29 LAB — HEPATITIS B SURFACE ANTIBODY,QUALITATIVE: Hep B S Ab: REACTIVE — AB

## 2021-07-29 LAB — HEPATITIS B SURFACE ANTIGEN: Hepatitis B Surface Ag: NONREACTIVE

## 2021-07-29 LAB — HEMOGLOBIN A1C
Hgb A1c MFr Bld: 5.5 % (ref 4.8–5.6)
Mean Plasma Glucose: 111.15 mg/dL

## 2021-07-29 LAB — PREPARE RBC (CROSSMATCH)

## 2021-07-29 MED ORDER — VANCOMYCIN HCL 125 MG PO CAPS
125.0000 mg | ORAL_CAPSULE | Freq: Four times a day (QID) | ORAL | Status: DC
  Administered 2021-07-29 – 2021-07-30 (×4): 125 mg via ORAL
  Filled 2021-07-29 (×13): qty 1

## 2021-07-29 MED ORDER — MAGNESIUM SULFATE IN D5W 1-5 GM/100ML-% IV SOLN
1.0000 g | Freq: Once | INTRAVENOUS | Status: AC
  Administered 2021-07-29: 1 g via INTRAVENOUS
  Filled 2021-07-29 (×2): qty 100

## 2021-07-29 MED ORDER — SODIUM CHLORIDE 0.9 % IV SOLN
100.0000 mL | INTRAVENOUS | Status: DC | PRN
Start: 2021-07-29 — End: 2021-07-30

## 2021-07-29 MED ORDER — SODIUM CHLORIDE 0.9% IV SOLUTION: Freq: Once | INTRAVENOUS | Status: DC

## 2021-07-29 MED ORDER — GLUCOSE-VITAMIN C 4-6 GM-MG PO CHEW
CHEWABLE_TABLET | ORAL | Status: AC
  Administered 2021-07-29: 4 g
  Filled 2021-07-29: qty 1

## 2021-07-29 MED ORDER — INSULIN GLARGINE-YFGN 100 UNIT/ML ~~LOC~~ SOLN
80.0000 [IU] | Freq: Every day | SUBCUTANEOUS | Status: DC
Start: 2021-07-29 — End: 2021-07-29
  Filled 2021-07-29: qty 0.8

## 2021-07-29 MED ORDER — LIDOCAINE HCL (PF) 1 % IJ SOLN: 5.0000 mL | INTRAMUSCULAR | Status: DC | PRN

## 2021-07-29 MED ORDER — INSULIN GLARGINE-YFGN 100 UNIT/ML ~~LOC~~ SOLN
70.0000 [IU] | Freq: Every day | SUBCUTANEOUS | Status: DC
  Filled 2021-07-29 (×3): qty 0.7

## 2021-07-29 MED ORDER — PENTAFLUOROPROP-TETRAFLUOROETH EX AERO
1.0000 "application " | INHALATION_SPRAY | CUTANEOUS | Status: DC | PRN
  Filled 2021-07-29: qty 30

## 2021-07-29 MED ORDER — LIDOCAINE-PRILOCAINE 2.5-2.5 % EX CREA
1.0000 "application " | TOPICAL_CREAM | CUTANEOUS | Status: DC | PRN
  Filled 2021-07-29: qty 5

## 2021-07-29 MED ORDER — SODIUM CHLORIDE 0.9 % IV SOLN: 100.0000 mL | INTRAVENOUS | Status: DC | PRN

## 2021-07-29 NOTE — Progress Notes (Signed)
Inpatient Diabetes Program Recommendations  AACE/ADA: New Consensus Statement on Inpatient Glycemic Control (2015)  Target Ranges:  Prepandial:   less than 140 mg/dL      Peak postprandial:   less than 180 mg/dL (1-2 hours)      Critically ill patients:  140 - 180 mg/dL   Lab Results  Component Value Date   GLUCAP 63 (L) 07/29/2021   HGBA1C 5.5 07/29/2021    Review of Glycemic Control  Latest Reference Range & Units 07/28/21 20:44 07/29/21 07:19  Glucose-Capillary 70 - 99 mg/dL 156 (H) 63 (L)   Diabetes history: DM 2 Outpatient Diabetes medications: Tresiba 120 units qhs, Humalog 10-50 units tid Current orders for Inpatient glycemic control:  Semglee 90 units qhs Novolog 0-15 units tid + hs  A1c 5.5% on 2/28  Inpatient Diabetes Program Recommendations:    Hypoglycemia this am 63.  - reduce Semglee to 80 units  Thanks,  Tama Headings RN, MSN, BC-ADM Inpatient Diabetes Coordinator Team Pager [phone] (8a-5p)

## 2021-07-29 NOTE — Procedures (Signed)
° °  HEMODIALYSIS TREATMENT NOTE:   Uneventful 3.5 hour heparin-free session completed using left upper arm AVF (15g/antegrade). Goal met: 3 liters removed without interruption in UF.  One unit pRBC transfused.  All blood was returned and hemostasis was achieved in 20 minutes. No changes from pre-HD assessment.   Rockwell Alexandria, RN

## 2021-07-29 NOTE — Discharge Planning (Signed)
Oncology Discharge Planning Admission Note  [place] at [facility] Address: [address], [place] [address] Hours of Operation:  8am - 5pm, Monday - Friday  Clinic Contact Information:  [phone]  Oncology Care Team: Medical Oncologist:  [facility]  Hematology oncology provider Tarri Abernethy- [facility] is aware of this hospital admission dated 07/28/21. The cancer center will follow Jeffrey [name] inpatient care to assist with discharge planning as indicated by the oncologist.  We will arrange for outpatient follow up as needed closer to discharge..  Disclaimer:  This [facility] note does not imply a formal consult request has been made by the admitting attending for this admission or there will be an inpatient consult completed by oncology.  Please request oncology consults as per standard process as indicated.

## 2021-07-29 NOTE — Hospital Course (Signed)
Per HPI: Jeffrey [name] is a 76 y.o. male with medical history significant of with history of C. difficile 6 years ago, atrial fibrillation, alcoholic cirrhosis, anemia, anxiety, cholelithiasis, B12 deficiency, diabetes mellitus type 2, peptic ulcer disease, GERD, hypertension, SBP, and more presents ED with chief complaint of diarrhea.  Patient reports that the diarrhea started 3 days ago.  He has been going 3-4 times per day.  He has been throwing up 4-5 times per day.  Anytime he tries to eat he feels nauseous.  He was taking Zofran, and it helped.  He took Imodium on Saturday, which helped temporarily.  He has had no blood in his stool or vomitus.  He reports associated left lower quadrant right lower quadrant cramping and sharp pain intermittently.  This pain did not present now but have been in the last few days.  Patient does still make urine, where the catheter bag.  Urine is dark yellow, he makes 300-600 mL/day.  Patient denies any fever or shortness of breath.  He does have a orthopnea but reports that that is normal for him.  Patient did have C. difficile 6 years ago and reports that this does not feel like that.  He is taking Levaquin prophylactically at dialysis.  Patient has 1 sick contact-his wife-she is diarrhea lasted for approximately 24 hours.  Patient has no other complaints.  07/29/21: Patient was admitted for diarrhea and is noted to have C. difficile colitis.  Discussed case with GI and initiated vancomycin p.o. x10 days.  He is to have hemodialysis today per nephrology.  He also has transfusion dependent anemia and has received 1 unit PRBC with another unit due with dialysis today.

## 2021-07-29 NOTE — Progress Notes (Signed)
PROGRESS NOTE    Jeffrey [name]  NGE:[id number] DOB: 1945-06-21 DOA: 07/28/2021 PCP: Monico Blitz, MD   Brief Narrative:  Per HPI: Jeffrey [name] is a 76 y.o. male with medical history significant of with history of C. difficile 6 years ago, atrial fibrillation, alcoholic cirrhosis, anemia, anxiety, cholelithiasis, B12 deficiency, diabetes mellitus type 2, peptic ulcer disease, GERD, hypertension, SBP, and more presents ED with chief complaint of diarrhea.  Patient reports that the diarrhea started 3 days ago.  He has been going 3-4 times per day.  He has been throwing up 4-5 times per day.  Anytime he tries to eat he feels nauseous.  He was taking Zofran, and it helped.  He took Imodium on Saturday, which helped temporarily.  He has had no blood in his stool or vomitus.  He reports associated left lower quadrant right lower quadrant cramping and sharp pain intermittently.  This pain did not present now but have been in the last few days.  Patient does still make urine, where the catheter bag.  Urine is dark yellow, he makes 300-600 mL/day.  Patient denies any fever or shortness of breath.  He does have a orthopnea but reports that that is normal for him.  Patient did have C. difficile 6 years ago and reports that this does not feel like that.  He is taking Levaquin prophylactically at dialysis.  Patient has 1 sick contact-his wife-she is diarrhea lasted for approximately 24 hours.  Patient has no other complaints.  07/29/21: Patient was admitted for diarrhea and is noted to have C. difficile colitis.  Discussed case with GI and initiated vancomycin p.o. x10 days.  He is to have hemodialysis today per nephrology.  He also has transfusion dependent anemia and has received 1 unit PRBC with another unit due with dialysis today.    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 intake and liver cirrhosis  encourage nutrient dense food choices Continue to monitor  ESRD on dialysis [facility]) Appreciate nephrology evaluation with plans for hemodialysis today  Type 2 diabetes mellitus with stage 3 chronic kidney disease, with long-term current use of insulin ([place]) Patient takes 120 units of insulin at home Reduced dose while in the hospital down to 80 units per diabetes coordinator recommendations Sliding scale coverage A1c 5.5% Continue to monitor  Diarrhea- (present on admission) Patient with history of C. Difficile -and C. difficile is positive Discussed with GI and vancomycin initiated x10 days GI pathogen panel pending After sample was produced, hold Imodium Hypokalemia is likely secondary to GI loss, see above FOBT negative Continue to monitor  Liver cirrhosis ([place])- (present on admission) Continue Xifaxan Continue  to monitor   DVT prophylaxis: Heparin to SCDs given thrombocytopenia Code Status: Full Family Communication: None at bedside Disposition Plan:  Status is: Observation The patient will require care spanning > 2 midnights and should be moved to inpatient because:  Need for ongoing monitoring/IV medications.  Consultants:  Nephrology Discussed case with GI  Procedures:  See below  Antimicrobials:  Anti-infectives (From admission, onward)    Start     Dose/Rate Route Frequency Ordered Stop   07/30/21 0900  levofloxacin (LEVAQUIN) tablet 500 mg       Note to Pharmacy: OP DPO:EUMP 1 Oil Trough AFTER DIALYSIS Patient taking differently: AFTER DIALYSIS     500 mg Oral Once per day on Mon Wed Fri 07/28/21 2017     07/29/21 1200  vancomycin (VANCOCIN) capsule 125 mg        125 mg Oral Every 6 hours 07/29/21 1008     07/28/21 2200  rifaximin (XIFAXAN) tablet 550 mg        550 mg Oral 2 times daily 07/28/21 2017         Subjective: Patient seen and evaluated today with ongoing liquidy diarrhea.  He is tolerating oral intake with no further nausea or vomiting.  Plans for hemodialysis today and repeat PRBC transfusion.  Objective: Vitals:   07/29/21 0025 07/29/21 0430 07/29/21 0903 07/29/21 1105  BP: (!) 157/68 (!) 142/61 (!) 149/67 (!) 150/90  Pulse: 85 66 73 60  Resp: 20 18 18 18   Temp: 98.3 F (36.8 C) 98 F (36.7 C) 97.8 F (36.6 C) 97.9 F (36.6 C)  TempSrc: Oral Oral Oral Oral  SpO2: 100% 100% 100% 100%  Weight:    83.1 kg  Height:        Intake/Output Summary (Last 24 hours) at 07/29/2021 1211 Last data filed at 07/29/2021 0900 Gross per 24 hour  Intake 240 ml  Output 50 ml  Net 190 ml   Filed Weights   07/28/21 1207 07/28/21 2011 07/29/21 1105  Weight: 84.8 kg 82.3 kg 83.1 kg    Examination:  General exam: Appears calm and comfortable  Respiratory system: Clear to auscultation. Respiratory effort normal.  On nasal cannula oxygen. Cardiovascular system: S1 & S2 heard, RRR.  Gastrointestinal system: Abdomen is soft Central nervous system: Alert and awake Extremities: No edema Skin: No significant lesions noted Psychiatry: Flat affect.    Data Reviewed: I have personally reviewed  following labs and imaging studies  CBC: Recent Labs  Lab 07/28/21 1307 07/29/21 0522  WBC 1.6* 2.1*  NEUTROABS 1.3* 1.6*  HGB 6.6* 7.8*  HCT 21.3* 24.5*  MCV 97.7 98.0  PLT 51* 58*   Basic Metabolic Panel: Recent Labs  Lab 07/28/21 1307 07/29/21 0522  NA 131* 133*  K 3.1* 3.4*  CL 98 99  CO2 21* 24  GLUCOSE 191* 96  BUN 38* 41*  CREATININE 4.06* 4.32*  CALCIUM 8.5* 8.8*  MG 1.5* 1.6*   GFR: Estimated Creatinine Clearance: 15.5 mL/min (A) (by C-G formula based on SCr of 4.32 mg/dL (H)). Liver Function Tests: Recent Labs  Lab 07/28/21 1307 07/29/21 0522  AST 25 20  ALT 20 17  ALKPHOS 137* 128*  BILITOT 0.6 0.5  PROT 6.3* 6.1*  ALBUMIN 2.7* 2.6*   Recent Labs  Lab 07/28/21 1307  LIPASE 22   No results for input(s): AMMONIA in the last 168 hours. Coagulation Profile: No results for input(s): INR, PROTIME in the last 168 hours. Cardiac  Enzymes: No results for input(s): CKTOTAL, CKMB, CKMBINDEX, TROPONINI in the last 168 hours. BNP (last 3 results) No results for input(s): PROBNP in the last 8760 hours. HbA1C: Recent Labs    07/29/21 0522  HGBA1C 5.5   CBG: Recent Labs  Lab 07/28/21 2044 07/29/21 0719  GLUCAP 156* 63*   Lipid Profile: No results for input(s): CHOL, HDL, LDLCALC, TRIG, CHOLHDL, LDLDIRECT in the last 72 hours. Thyroid Function Tests: No results for input(s): TSH, T4TOTAL, FREET4, T3FREE, THYROIDAB in the last 72 hours. Anemia Panel: No results for input(s): VITAMINB12, FOLATE, FERRITIN, TIBC, IRON, RETICCTPCT in the last 72 hours. Sepsis Labs: No results for input(s): PROCALCITON, LATICACIDVEN in the last 168 hours.  Recent Results (from the past 240 hour(s))  Resp Panel by RT-PCR (Flu A&B, Covid) Nasopharyngeal Swab     Status: None   Collection Time: 07/28/21  3:40 PM   Specimen: Nasopharyngeal Swab; Nasopharyngeal(NP) swabs in vial transport medium  Result Value Ref Range Status   SARS Coronavirus 2 by RT PCR NEGATIVE  NEGATIVE Final    Comment: (NOTE) SARS-CoV-2 target nucleic acids are NOT DETECTED.  The SARS-CoV-2 RNA is generally detectable in upper respiratory specimens during the acute phase of infection. The lowest concentration of SARS-CoV-2 viral copies this assay can detect is 138 copies/mL. A negative result does not preclude SARS-Cov-2 infection and should not be used as the sole basis for treatment or other patient management decisions. A negative result may occur with  improper specimen collection/handling, submission of specimen other than nasopharyngeal swab, presence of viral mutation(s) within the areas targeted by this assay, and inadequate number of viral copies(<138 copies/mL). A negative result must be combined with clinical observations, patient history, and epidemiological information. The expected result is Negative.  Fact Sheet for Patients:  EntrepreneurPulse.com.au  Fact Sheet for Healthcare Providers:  IncredibleEmployment.be  This test is no t yet approved or cleared by the [place] FDA and  has been authorized for detection and/or diagnosis of SARS-CoV-2 by FDA under an Emergency Use Authorization (EUA). This EUA will remain  in effect (meaning this test can be used) for the duration of the COVID-19 declaration under Section 564(b)(1) of the Act, 21 U.S.C.section 360bbb-3(b)(1), unless the authorization is terminated  or revoked sooner.       Influenza A by PCR NEGATIVE NEGATIVE Final   Influenza B by PCR NEGATIVE NEGATIVE Final    Comment: (NOTE) The Xpert Xpress SARS-CoV-2/FLU/RSV plus assay is intended as an aid in the diagnosis of influenza from Nasopharyngeal swab specimens and should not be used as a sole basis for treatment. Nasal washings and aspirates are unacceptable for Xpert Xpress SARS-CoV-2/FLU/RSV testing.  Fact Sheet for Patients: EntrepreneurPulse.com.au  Fact Sheet for Healthcare  Providers: IncredibleEmployment.be  This test is not yet approved or cleared by the [place] FDA and has been authorized for detection and/or diagnosis of SARS-CoV-2 by FDA under an Emergency Use Authorization (EUA). This EUA will remain in effect (meaning this test can be used) for the duration of the COVID-19 declaration under Section 564(b)(1) of the Act, 21 U.S.C. section 360bbb-3(b)(1), unless the authorization is terminated or revoked.  Performed at [facility], [address] [place], [place] [address]   C Difficile Quick Screen w PCR reflex     Status: Abnormal   Collection Time: 07/28/21  7:47 PM   Specimen: STOOL  Result Value Ref Range Status   C Diff antigen POSITIVE (A) NEGATIVE Final    Comment: CRITICAL RESULT  CALLED TO, READ BACK BY AND VERIFIED WITH: MUSA J 07/28/21 AT 2047 BY ADGER J    C Diff toxin NEGATIVE NEGATIVE Final   C Diff interpretation Results are indeterminate. See PCR results.  Final    Comment: Performed at [facility], [address], [place], [place] [address]  C. Diff by PCR, Reflexed     Status: Abnormal   Collection Time: 07/28/21  7:47 PM  Result Value Ref Range Status   Toxigenic C. Difficile by PCR POSITIVE (A) NEGATIVE Final    Comment: Positive for toxigenic C. difficile with little to no toxin production. Only treat if clinical presentation suggests symptomatic illness. Performed at [facility], [place] [address] [place], [place] [address]          Radiology Studies: DG Chest Port 1 View  Result Date: 07/28/2021 CLINICAL DATA:  Provided history: Bibasilar crackles. Intermittent shortness of breath. Diarrhea. History of atrial fibrillation, cirrhosis, former smoker. EXAM: PORTABLE CHEST 1 VIEW COMPARISON:  CT angiogram chest 04/01/2020. Prior chest radiographs 04/01/2020 and earlier. FINDINGS: Cardiomegaly. Small bilateral pleural effusions. Superimposed opacity at the left lung base, which may reflect  atelectasis and/or pneumonia. Mild compressive atelectasis at the right lung base. No evidence of pneumothorax. No acute bony abnormality identified. IMPRESSION: Cardiomegaly. Small bilateral pleural effusions. Superimposed left basilar atelectasis and/or pneumonia. Mild compressive atelectasis at the right lung base. Electronically Signed   By: Kellie Simmering D.O.   On: 07/28/2021 13:47        Scheduled Meds:  [START ON 07/30/2021] ALPRAZolam  0.5 mg Oral Q M,W,F   Chlorhexidine Gluconate Cloth  6 each Topical Q0600   diltiazem  120 mg Oral Daily   furosemide  80 mg Oral Daily   gabapentin  100 mg Oral TID   heparin  5,000 Units Subcutaneous Q8H   hydrOXYzine  25 mg Oral TID   insulin aspart  0-15 Units Subcutaneous TID WC   insulin aspart  0-5 Units Subcutaneous QHS   insulin glargine-yfgn  80 Units Subcutaneous QHS   [START ON 07/30/2021] levofloxacin  500 mg Oral Once per day on Mon Wed Fri   loratadine  10 mg Oral Daily   metoprolol succinate  25 mg Oral QHS   metoprolol succinate  25 mg Oral Once per day on Sun Tue Thu Sat   midodrine  10 mg Oral BID WC   mirtazapine  30 mg Oral Q supper   omega-3 acid ethyl esters  2 g Oral BID   pantoprazole  80 mg Oral Daily   pravastatin  20 mg Oral q AM   rifaximin  550 mg Oral BID   spironolactone  50 mg Oral q AM   thiamine  100 mg Oral Daily   vancomycin  125 mg Oral Q6H   venlafaxine  75 mg Oral TID WC   Continuous Infusions:  sodium chloride     sodium chloride       LOS: 0 days    Time spent: 35 minutes    Jyra Lagares Darleen Crocker, DO [facility]  If 7PM-7AM, please contact night-coverage [email or web address] 07/29/2021, 12:11 PM

## 2021-07-29 NOTE — Consult Note (Signed)
[facility] Renal Consultation Note    Indication for Consultation:  Management of ESRD/hemodialysis; anemia, hypertension/volume and secondary hyperparathyroidism  HPI: Jeffrey [name] is a 76 y.o. male with a PMH significant for alcoholic cirrhosis, DM, HTN, atrial fib, B12 deficiency, pancytopenia, transfusion dependent anemia, esophageal varices, GERD, PUD, and ESRD on HD MWF at [facility] who presented to [facility] ED with a 3 day history of profuse water diarrhea.  In the ED, he was afebrile and VSS.  Labs were notable for Na 131, K 3.1, Co2 21, BUN 38, Mg 1.5, alb 2.7, WBC 1.6, Hgb 6.6, plt 51.  Covid/flu negative. He was admitted under observation for blood transfusion and electrolyte abnormalities.  We were consulted to provide dialysis during his hospital stay.  He reports continued diarrhea but denies any abdominal pain, SOB, orthopnea, or PND.  Past Medical History:  Diagnosis Date   A-fib [facility])    when initially stating dialysis january 2021 at [facility] per spouse; never heard anything else about it   Alcoholic cirrhosis [facility])    patient reports completing hep a and b vaccines in 2006   Anemia    has had 3 units of prbcs 2013   Anxiety    Arthritis    Asymptomatic gallstones    Ultrasound in 2006   B12 deficiency 11/21/2020   C. difficile colitis    Cholelithiasis    Chronic kidney disease    Dialysis M/W/F/Sa   Depression    Diabetes ([place])    Duodenal ulcer with hemorrhage    per patient in 2006 or 2007, records have been requested   Dyspnea    low oxygen level -  has O2 2 L all day   Esophageal varices ([place])    see PSH   GERD (gastroesophageal reflux disease)    Heart burn    History of alcohol abuse    quit 05/2013   HOH (hard of hearing)    Hypertension    Liver cirrhosis ([place])    ETOH   SBP (spontaneous bacterial peritonitis) ([place])    2020, November 2021.    Splenomegaly    Ultrasound in 2006   Past Surgical History:  Procedure  Laterality Date   AGILE CAPSULE N/A 09/20/2019   Procedure: AGILE CAPSULE;  Surgeon: Daneil Dolin, MD;  [facility];  Service: Endoscopy;  Laterality: N/A;   APPENDECTOMY     AV FISTULA PLACEMENT Left 08/17/2019   Procedure: ARTERIOVENOUS (AV) FISTULA CREATION VERSES ARTERIOVENOUS GRAFT;  Surgeon: [name], MD;  Location: [facility] OR;  Service: Vascular;  Laterality: Left;   BASCILIC VEIN TRANSPOSITION Left 09/28/2019   Procedure: LEFT SECOND STAGE [place];  Surgeon: [name], MD;  Location: [place];  Service: Vascular;  Laterality: Left;   BIOPSY  02/03/2018   Procedure: BIOPSY;  Surgeon: Daneil Dolin, MD;  [facility];  Service: Endoscopy;;  bx of gastric polyps   BIOPSY  09/19/2019   Procedure: BIOPSY;  Surgeon: Danie Binder, MD;  [facility];  Service: Endoscopy;;   BIOPSY  01/16/2021   Procedure: BIOPSY;  Surgeon: Daneil Dolin, MD;  [facility];  Service: Endoscopy;;   CATARACT EXTRACTION Right    COLONOSCOPY  03/10/2005   Rectal polyp as described above, removed with snare. Left sided  diverticula. The remainder of the colonic mucosa appeared normal. Inflammed polyp on path.   COLONOSCOPY  04/1999   Dr. Thea Silversmith polyps  removed,  Path showed hyperplastic   COLONOSCOPY  10/2015   Dr. Britta Mccreedy: diverticulosis, single sessile tubular adenoma 3-23mm in size removed from descending colon.    COLONOSCOPY WITH ESOPHAGOGASTRODUODENOSCOPY (EGD)  03/31/2012   ALP:[id number] AVMs. Colonic diverticulosis. tubular adenoma colon   COLONOSCOPY WITH ESOPHAGOGASTRODUODENOSCOPY (EGD)  06/2019   FORSYTH: small esophageal varices without high risk stigmata, single large AVM on lesser curvature in gastric body s/p APC ablation, gastric antral and duodenal bulb polyposis. No significant source to explain transfusion dependent anemia. Colonoscopy with portal colopathy and diffuse edema, changes of Cdiff colitis on colonoscopy    COLONOSCOPY WITH PROPOFOL N/A 01/19/2021   Surgeon: [name], MD; 15 mm polyp in descending colon removed piecemeal, 6 mm polyp in ascending colon and 12 mm polyp in sigmoid colon not removed due to high bleeding risk, benign polypoid lesion at splenic flexure, diverticulosis in sigmoid and descending colon, rectal varices.   COLONOSCOPY WITH PROPOFOL N/A 01/16/2021   Surgeon: Daneil Dolin, MD;  Portal colopathy, large rectal varices, polypoid mass at splenic flexure biopsy, two 1 cm left colon polyps not removed, left-sided diverticulosis.  Reported patient had bleeding from splenic flexure lesion.  Pathology with acutely inflamed colonic mucosa with localized ulceration and prominent reactive changes.   ESOPHAGEAL BANDING N/A 02/03/2018   Procedure: ESOPHAGEAL BANDING;  Surgeon: Daneil Dolin, MD;  [facility];  Service: Endoscopy;  Laterality: N/A;   ESOPHAGEAL BANDING  01/16/2021   Procedure: ESOPHAGEAL BANDING;  Surgeon: Daneil Dolin, MD;  [facility];  Service: Endoscopy;;   ESOPHAGOGASTRODUODENOSCOPY  01/15/2005   Three columns grade 1 to 2 esophageal varices, otherwise normal esophageal mucosa.  Esophagus was not manipulated otherwise./Nodularity of the antrum with overlying erosions, nonspecific finding. Path showed rare H.pylori   ESOPHAGOGASTRODUODENOSCOPY  09/2008   Dr. Gaylene Brooks columns of grade 2-3 esoph varices, only one column was prominent. Portal gastropathy, multiple gastrc polyps at antrum, two were 2cm with black eschar, bulbar polyps, bulbar erosions   ESOPHAGOGASTRODUODENOSCOPY  11/2004   Dr. Brantley Stage 3 esoph varices   ESOPHAGOGASTRODUODENOSCOPY  03/31/2012   RMR: 4 columns(3-GR 2, 1-GR1) non-bleeding esophageal varices, portal gastropathy, small HH, early GAVE, multiple gastric polyps    ESOPHAGOGASTRODUODENOSCOPY (EGD) WITH PROPOFOL N/A 02/03/2018   Dr. Gala Romney: Esophageal varices, 3 columns grade 1-2.  Portal  hypertensive gastropathy.  Multiple gastric polyps, biopsy consistent with hyperplastic.   ESOPHAGOGASTRODUODENOSCOPY (EGD) WITH PROPOFOL N/A 09/19/2019   Fields: grade I and II esophageal varcies, mild portal hypertensive gastropathy, moderate gastritis but no H. pylori, single hyperplastic gastric polyp removed, obvious source for melena/transfusion dependent anemia not identified, may be due to friable gastric and duodenal mucosa in the setting of portal hypertension   ESOPHAGOGASTRODUODENOSCOPY (EGD) WITH PROPOFOL N/A 01/16/2021   Surgeon: Daneil Dolin, MD; Portal hypertensive gastropathy, grade 2 esophageal varices with bleeding stigmata s/p banding x4, slight nodularity and congestion involving bulb and second portion of duodenum, nonspecific.  Recommended repeat EGD in 4 weeks.   ESOPHAGOGASTRODUODENOSCOPY (EGD) WITH PROPOFOL N/A 05/22/2021   Procedure: ESOPHAGOGASTRODUODENOSCOPY (EGD) WITH PROPOFOL;  Surgeon: Daneil Dolin, MD;  [facility];  Service: Endoscopy;  Laterality: N/A;  8:15am, dialysis patient   GASTRIC VARICES BANDING  03/31/2012   Procedure: GASTRIC VARICES BANDING;  Surgeon: Daneil Dolin, MD;  [facility];  Service: Endoscopy;  Laterality: N/A;   GIVENS CAPSULE STUDY N/A 09/21/2019   Poor study with a lot of debris obstructing much of the  view of the first approximate 3 hours out of 6 hours.  No obvious source of bleeding identified.  Sequela of bleeding and old blood seen in the form of"whisps" of blood, blood flecks mostly toward the end of the study.   HEMOSTASIS CLIP PLACEMENT  01/19/2021   Procedure: HEMOSTASIS CLIP PLACEMENT;  Surgeon: [name], MD;  Location: [facility] ENDOSCOPY;  Service: Gastroenterology;;   IR REMOVAL TUN CV CATH W/O FL  02/15/2020   POLYPECTOMY  09/19/2019   Procedure: POLYPECTOMY;  Surgeon: Danie Binder, MD;  [facility];  Service: Endoscopy;;   POLYPECTOMY  01/19/2021   Procedure: POLYPECTOMY;   Surgeon: [name], MD;  Location[facility] ENDOSCOPY;  Service: Gastroenterology;;   UMBILICAL HERNIA REPAIR  2017   exploratory laparotomy, abdominal washout   Family History:   Family History  Problem Relation Age of Onset   Colon cancer Father 6       deceased age 31   Breast cancer Sister        deceased   Diabetes Sister    Stroke Mother    Healthy Son    Healthy Daughter    Lung cancer Neg Hx    Ovarian cancer Neg Hx    Social History:  reports that he quit smoking about 35 years ago. His smoking use included cigarettes. He has a 50.00 pack-year smoking history. He has never used smokeless tobacco. He reports that he does not drink alcohol and does not use drugs. Allergies  Allergen Reactions   Tape Itching, Rash and Other (See Comments)    Tears skin   Prior to Admission medications   Medication Sig Start Date End Date Taking? Authorizing Provider  acetaminophen (TYLENOL) 650 MG CR tablet Take 650 mg by mouth every 8 (eight) hours as needed for pain.   Yes [provider]  ALPRAZolam Duanne Moron) 0.5 MG tablet Take 0.5 mg by mouth See admin instructions. Take one tablet (0.5 mg) by mouth on Monday, Wednesday, Friday before dialysis, may also take one tablet (0.5 mg) daily as needed for anxiety. 09/21/20  Yes [provider]  Ascorbic Acid (VITAMIN C) 1000 MG tablet Take 1,000 mg by mouth in the morning.   Yes [provider]  calcium carbonate (TUMS - DOSED IN MG ELEMENTAL CALCIUM) 500 MG chewable tablet Chew 1 tablet by mouth 3 (three) times daily with meals.   Yes [provider]  Calcium Carbonate-Simethicone (ALKA-SELTZER HEARTBURN + GAS) 750-80 MG CHEW Chew 1-2 tablets by mouth 2 (two) times daily as needed (acid reflux).   Yes [provider]  cetirizine (ZYRTEC) 10 MG tablet Take 10 mg by mouth daily. 06/24/21  Yes [provider]  cholecalciferol (VITAMIN D3) 25 MCG (1000 UNIT) tablet Take 1,000 Units by mouth daily.    Yes [provider]  diltiazem (TIAZAC) 120 MG 24 hr capsule Take 120 mg by mouth daily. 05/28/20  Yes [provider]  furosemide (LASIX) 40 MG tablet Take 80 mg by mouth daily. 12/29/19  Yes [provider]  gabapentin (NEURONTIN) 100 MG capsule TAKE 1 CAPSULE BY MOUTH AT BEDTIME Patient taking differently: Take 100 mg by mouth 3 (three) times daily. 05/28/20  Yes Waynetta Sandy, MD  Garlic 1610 MG CAPS Take 1,000 mg by mouth daily.   Yes [provider]  HUMALOG KWIKPEN 100 UNIT/ML KwikPen 10-50 Units 3 (three) times daily. Sliding scale 04/23/21  Yes [provider]  hydrOXYzine (ATARAX/VISTARIL) 25 MG tablet Take  25 mg by mouth 3 (three) times daily. For itching 11/23/20  Yes [provider]  iron polysaccharides (NIFEREX) 150 MG capsule Take 150 mg by mouth daily with supper.   Yes [provider]  lactulose (CONSTULOSE) 10 GM/15ML solution TAKE 45 MLS EVERY MORNING AND TAKE 45 MLS EVERY EVENING Patient taking differently: Take 30 g by mouth 2 (two) times daily as needed for mild constipation. 07/17/21  Yes Mahala Menghini, PA-C  levofloxacin (LEVAQUIN) 500 MG tablet TAKE 1 TABLET BY MOUTH THREE TIMES EVERY WEEK AFTER DIALYSIS Patient taking differently: Take 500 mg by mouth 3 (three) times a week. AFTER DIALYSIS 03/14/21  Yes [name] S, PA-C  lidocaine-prilocaine (EMLA) cream Apply 2 application topically daily. 06/11/21  Yes [provider]  metoprolol succinate (TOPROL-XL) 25 MG 24 hr tablet Take 1 tablet (25 mg total) by mouth See admin instructions. Take one tablet (25 mg) by mouth at bedtime every night and take one tablet (25 mg) in morning on  Sun, Tues, Thurs and Sat (non-dialysis days) 04/15/21  Yes Johnson, Clanford L, MD  midodrine (PROAMATINE) 10 MG tablet Take 1 tablet (10 mg total) by mouth 2 (two) times daily with a meal. 04/06/20  Yes Tat, Shanon Brow, MD  milk thistle 175 MG tablet Take 175 mg by  mouth 3 (three) times daily.    Yes [provider]  mirtazapine (REMERON) 30 MG tablet Take 1 tablet (30 mg total) by mouth at bedtime. Patient taking differently: Take 30 mg by mouth daily with supper. 02/10/20  Yes Roxan Hockey, MD  Multiple Vitamin (MULTIVITAMIN WITH MINERALS) TABS tablet Take 1 tablet by mouth in the morning.    Yes [provider]  omega-3 acid ethyl esters (LOVAZA) 1 g capsule Take 2 g by mouth 2 (two) times daily.  04/07/19  Yes [provider]  ondansetron (ZOFRAN) 4 MG tablet Take 4 mg by mouth every 8 (eight) hours as needed for nausea or vomiting.   Yes [provider]  oxyCODONE-acetaminophen (PERCOCET/ROXICET) 5-325 MG tablet Take 1 tablet by mouth every 4 (four) hours as needed for moderate pain. 02/27/20  Yes [provider]  pantoprazole (PROTONIX) 40 MG tablet Take 2 tablets (80 mg total) by mouth daily. 01/21/21  Yes Shelly Coss, MD  Ped Vitamins ACD Fl-Iron (TRI-VIT/FLUORIDE/IRON PO) Take 1 tablet by mouth daily. 08/01/19  Yes [provider]  pravastatin (PRAVACHOL) 20 MG tablet Take 20 mg by mouth in the morning.    Yes [provider]  Probiotic Product (PROBIOTIC DAILY PO) Take 1 tablet by mouth in the morning.    Yes [provider]  spironolactone (ALDACTONE) 50 MG tablet Take 50 mg by mouth in the morning.    Yes [provider]  thiamine (VITAMIN B-1) 100 MG tablet Take 100 mg by mouth daily. 08/27/20  Yes [provider]  tiZANidine (ZANAFLEX) 2 MG tablet Take 2 mg by mouth 2 (two) times daily as needed for muscle spasms. 02/29/20  Yes [provider]  TRESIBA FLEXTOUCH 200 UNIT/ML FlexTouch Pen Inject 120 Units into the skin at bedtime. 04/22/21  Yes [provider]  triamcinolone cream (KENALOG) 0.1 % Apply 1 application topically 2 (two) times daily as needed (dry skin Itching). 12/30/17  Yes [provider]  venlafaxine (EFFEXOR) 75 MG  tablet Take 75 mg by mouth 3 (three) times daily with meals.    Yes [provider]  XIFAXAN 550 MG TABS tablet TAKE 1 TABLET BY  MOUTH TWICE DAILY Patient taking differently: Take 550 mg by mouth 2 (two) times daily. 02/13/21  Yes Mahala Menghini, PA-C  Alcohol Swabs (ALCOHOL PREP) 70 % PADS Apply 1 each topically 3 (three) times daily. 01/31/20   [provider]  BD PEN NEEDLE MICRO U/F 32G X 6 MM MISC SMARTSIG:SUB-Q Daily PRN 04/23/21   [provider]  GLUCAGEN HYPOKIT 1 MG SOLR injection Inject 1 mg into the muscle once as needed for low blood sugar. 11/28/20   [provider]  hydrocortisone (ANUSOL-HC) 2.5 % rectal cream Place 1 application rectally 2 (two) times daily as needed for hemorrhoids or anal itching. Patient not taking: Reported on 07/28/2021 04/15/21   Murlean Iba, MD   Current Facility-Administered Medications  Medication Dose Route Frequency Provider Last Rate Last Admin   acetaminophen (TYLENOL) tablet 650 mg  650 mg Oral Q6H PRN Zierle-Ghosh, Asia B, DO       Or   acetaminophen (TYLENOL) suppository 650 mg  650 mg Rectal Q6H PRN Zierle-Ghosh, Asia B, DO       [START ON 07/30/2021] ALPRAZolam (XANAX) tablet 0.5 mg  0.5 mg Oral Q M,W,F Zierle-Ghosh, Asia B, DO       ALPRAZolam (XANAX) tablet 0.5 mg  0.5 mg Oral Daily PRN Zierle-Ghosh, Asia B, DO       Chlorhexidine Gluconate Cloth 2 % PADS 6 each  6 each Topical Q0600 Donato Heinz, MD   6 each at 07/29/21 0604   diltiazem (TIAZAC) 24 hr capsule 120 mg  120 mg Oral Daily Zierle-Ghosh, Asia B, DO       furosemide (LASIX) tablet 80 mg  80 mg Oral Daily Zierle-Ghosh, Asia B, DO       gabapentin (NEURONTIN) capsule 100 mg  100 mg Oral TID Zierle-Ghosh, Asia B, DO   100 mg at 07/28/21 2255   heparin injection 5,000 Units  5,000 Units Subcutaneous Q8H Zierle-Ghosh, Asia B, DO   5,000 Units at 07/29/21 0603   hydrOXYzine (ATARAX) tablet 25 mg  25 mg Oral TID Zierle-Ghosh, Asia B, DO   25 mg  at 07/28/21 2255   insulin aspart (novoLOG) injection 0-15 Units  0-15 Units Subcutaneous TID WC Zierle-Ghosh, Asia B, DO       insulin aspart (novoLOG) injection 0-5 Units  0-5 Units Subcutaneous QHS Zierle-Ghosh, Asia B, DO       insulin glargine-yfgn (SEMGLEE) injection 90 Units  90 Units Subcutaneous QHS Zierle-Ghosh, Asia B, DO   90 Units at 07/28/21 2256   lactulose (CHRONULAC) 10 GM/15ML solution 30 g  30 g Oral BID PRN Zierle-Ghosh, Asia B, DO       [START ON 07/30/2021] levofloxacin (LEVAQUIN) tablet 500 mg  500 mg Oral Once per day on Mon Wed Fri Zierle-Ghosh, [place] B, DO       loratadine (CLARITIN) tablet 10 mg  10 mg Oral Daily Zierle-Ghosh, Asia B, DO   10 mg at 07/28/21 2256   magnesium sulfate IVPB 1 g 100 mL  1 g Intravenous Once Shah, Pratik D, DO       metoprolol succinate (TOPROL-XL) 24 hr tablet 25 mg  25 mg Oral QHS Zierle-Ghosh, Asia B, DO   25 mg at 07/28/21 2256   metoprolol succinate (TOPROL-XL) 24 hr tablet 25 mg  25 mg Oral Once per day on Sun Tue Thu Sat Zierle-Ghosh, [place] B, DO       midodrine (PROAMATINE) tablet 10 mg  10 mg Oral BID WC Zierle-Ghosh,  Asia B, DO       mirtazapine (REMERON) tablet 30 mg  30 mg Oral Q supper Zierle-Ghosh, Asia B, DO       morphine (PF) 2 MG/ML injection 2 mg  2 mg Intravenous Q2H PRN Zierle-Ghosh, Asia B, DO       omega-3 acid ethyl esters (LOVAZA) capsule 2 g  2 g Oral BID Zierle-Ghosh, Asia B, DO   2 g at 07/28/21 2255   oxyCODONE-acetaminophen (PERCOCET/ROXICET) 5-325 MG per tablet 1 tablet  1 tablet Oral Q4H PRN Zierle-Ghosh, Asia B, DO       pantoprazole (PROTONIX) EC tablet 80 mg  80 mg Oral Daily Zierle-Ghosh, Asia B, DO       pravastatin (PRAVACHOL) tablet 20 mg  20 mg Oral q AM Zierle-Ghosh, Asia B, DO       rifaximin (XIFAXAN) tablet 550 mg  550 mg Oral BID Zierle-Ghosh, Asia B, DO   550 mg at 07/28/21 2255   spironolactone (ALDACTONE) tablet 50 mg  50 mg Oral q AM Zierle-Ghosh, Asia B, DO       thiamine tablet 100 mg  100 mg Oral  Daily Zierle-Ghosh, Asia B, DO       venlafaxine (EFFEXOR) tablet 75 mg  75 mg Oral TID WC Zierle-Ghosh, Asia B, DO       Labs: Basic Metabolic Panel: Recent Labs  Lab 07/28/21 1307 07/29/21 0522  NA 131* 133*  K 3.1* 3.4*  CL 98 99  CO2 21* 24  GLUCOSE 191* 96  BUN 38* 41*  CREATININE 4.06* 4.32*  CALCIUM 8.5* 8.8*   Liver Function Tests: Recent Labs  Lab 07/28/21 1307 07/29/21 0522  AST 25 20  ALT 20 17  ALKPHOS 137* 128*  BILITOT 0.6 0.5  PROT 6.3* 6.1*  ALBUMIN 2.7* 2.6*   Recent Labs  Lab 07/28/21 1307  LIPASE 22   No results for input(s): AMMONIA in the last 168 hours. CBC: Recent Labs  Lab 07/28/21 1307 07/29/21 0522  WBC 1.6* 2.1*  NEUTROABS 1.3* 1.6*  HGB 6.6* 7.8*  HCT 21.3* 24.5*  MCV 97.7 98.0  PLT 51* 58*   Cardiac Enzymes: No results for input(s): CKTOTAL, CKMB, CKMBINDEX, TROPONINI in the last 168 hours. CBG: Recent Labs  Lab 07/28/21 2044 07/29/21 0719  GLUCAP 156* 63*   Iron Studies: No results for input(s): IRON, TIBC, TRANSFERRIN, FERRITIN in the last 72 hours. Studies/Results: DG Chest Port 1 View  Result Date: 07/28/2021 CLINICAL DATA:  Provided history: Bibasilar crackles. Intermittent shortness of breath. Diarrhea. History of atrial fibrillation, cirrhosis, former smoker. EXAM: PORTABLE CHEST 1 VIEW COMPARISON:  CT angiogram chest 04/01/2020. Prior chest radiographs 04/01/2020 and earlier. FINDINGS: Cardiomegaly. Small bilateral pleural effusions. Superimposed opacity at the left lung base, which may reflect atelectasis and/or pneumonia. Mild compressive atelectasis at the right lung base. No evidence of pneumothorax. No acute bony abnormality identified. IMPRESSION: Cardiomegaly. Small bilateral pleural effusions. Superimposed left basilar atelectasis and/or pneumonia. Mild compressive atelectasis at the right lung base. Electronically Signed   By: Kellie Simmering D.O.   On: 07/28/2021 13:47    ROS: Pertinent items are noted in  HPI. Physical Exam: Vitals:   07/28/21 2013 07/28/21 2018 07/29/21 0025 07/29/21 0430  BP: (!) 164/68  (!) 157/68 (!) 142/61  Pulse: 79  85 66  Resp: 20  20 18   Temp: 98.6 F (37 C)  98.3 F (36.8 C) 98 F (36.7 C)  TempSrc: Oral  Oral Oral  SpO2: 97% 100%  100% 100%  Weight:      Height:          Weight change:   Intake/Output Summary (Last 24 hours) at 07/29/2021 0858 Last data filed at 07/29/2021 0545 Gross per 24 hour  Intake 0 ml  Output 50 ml  Net -50 ml   BP (!) 142/61 (BP Location: Right Arm)    Pulse 66    Temp 98 F (36.7 C) (Oral)    Resp 18    Ht 5\' 7"  (1.702 m)    Wt 82.3 kg    SpO2 100%    BMI 28.42 kg/m  General appearance: alert, cooperative, and no distress Head: Normocephalic, without obvious abnormality, atraumatic Resp: clear to auscultation bilaterally Cardio: regular rate and rhythm, S1, S2 normal, no murmur, click, rub or gallop GI: soft, non-tender; bowel sounds normal; no masses,  no organomegaly Extremities: edema trace pretibial edema and LUE AVF +T/B with aneurysmal changes Dialysis Access:  Dialysis Orders: Center: [facility]  on MWF . EDW 85 kg HD Bath 2K/2.5Ca  Time 4 hours Heparin none. Access LUE AVF BFR 350  DFR 500    Micera 200 mcg IV every 2 weeks  Venofer  50 mg IV MWF   Assessment/Plan:  Transfusion-dependent anemia - s/p 1 unit PRBC's yesterday and again today.  Pancytopenia - multifactorial with ESRD, ESLD, and splenomegaly.  Followed by Hematology.  Diarrhea - profuse watery stools for 3 days.  + C. Diff quick screen.  GI panel in process.  ESRD -  Will plan for HD today and get back on his MWF schedule soon.  Hypertension/volume  - stable  Anemia  -  as above.  For another transfusion today.  Metabolic bone disease -  Need to check phos level  Nutrition - renal diet, carb modified  Hypokalemia - will use added K bath with HD and follow.  Hypomagnesemia - replete and follow.  Difficult since he has diarrhea and IV Mg not well  absorbed unless given slowly over several hours.  Donetta Potts, MD [place] Pager [phone] 07/29/2021, 8:58 AM

## 2021-07-29 NOTE — Progress Notes (Signed)
°  Transition of Care [facility]) Screening Note   Patient Details  Name: Jeffrey [name] Date of Birth: Sep 12, 1945   Transition of Care [facility]) CM/SW Contact:    Boneta Lucks, RN Phone Number: 07/29/2021, 1:16 PM    Transition of Care Department [facility]) has reviewed patient and no TOC needs have been identified at this time. We will continue to monitor patient advancement through interdisciplinary progression rounds. If new patient transition needs arise, please place a TOC consult.

## 2021-07-30 ENCOUNTER — Encounter (HOSPITAL_COMMUNITY): Payer: Self-pay | Admitting: Hematology

## 2021-07-30 ENCOUNTER — Other Ambulatory Visit (HOSPITAL_COMMUNITY): Payer: Self-pay

## 2021-07-30 DIAGNOSIS — N186 End stage renal disease: Secondary | ICD-10-CM | POA: Diagnosis not present

## 2021-07-30 DIAGNOSIS — K7030 Alcoholic cirrhosis of liver without ascites: Secondary | ICD-10-CM

## 2021-07-30 DIAGNOSIS — A498 Other bacterial infections of unspecified site: Secondary | ICD-10-CM

## 2021-07-30 DIAGNOSIS — Z794 Long term (current) use of insulin: Secondary | ICD-10-CM

## 2021-07-30 DIAGNOSIS — E1122 Type 2 diabetes mellitus with diabetic chronic kidney disease: Secondary | ICD-10-CM | POA: Diagnosis not present

## 2021-07-30 DIAGNOSIS — R197 Diarrhea, unspecified: Secondary | ICD-10-CM | POA: Diagnosis not present

## 2021-07-30 DIAGNOSIS — D649 Anemia, unspecified: Secondary | ICD-10-CM | POA: Diagnosis not present

## 2021-07-30 LAB — RENAL FUNCTION PANEL
Albumin: 3 g/dL — ABNORMAL LOW (ref 3.5–5.0)
Anion gap: 10 (ref 5–15)
BUN: 20 mg/dL (ref 8–23)
CO2: 26 mmol/L (ref 22–32)
Calcium: 8.6 mg/dL — ABNORMAL LOW (ref 8.9–10.3)
Chloride: 95 mmol/L — ABNORMAL LOW (ref 98–111)
Creatinine, Ser: 2.71 mg/dL — ABNORMAL HIGH (ref 0.61–1.24)
GFR, Estimated: 24 mL/min — ABNORMAL LOW (ref >60)
Glucose, Bld: 68 mg/dL — ABNORMAL LOW (ref 70–99)
Phosphorus: 2.1 mg/dL — ABNORMAL LOW (ref 2.5–4.6)
Potassium: 3.9 mmol/L (ref 3.5–5.1)
Sodium: 131 mmol/L — ABNORMAL LOW (ref 135–145)

## 2021-07-30 LAB — BPAM RBC
Blood Product Expiration Date: 202304032359
Blood Product Expiration Date: 202304032359
ISSUE DATE / TIME: 202302271619
ISSUE DATE / TIME: 202302281302
Unit Type and Rh: 5100
Unit Type and Rh: 5100

## 2021-07-30 LAB — CBC
HCT: 31.8 % — ABNORMAL LOW (ref 39.0–52.0)
Hemoglobin: 10.3 g/dL — ABNORMAL LOW (ref 13.0–17.0)
MCH: 31.5 pg (ref 26.0–34.0)
MCHC: 32.4 g/dL (ref 30.0–36.0)
MCV: 97.2 fL (ref 80.0–100.0)
Platelets: 70 10*3/uL — ABNORMAL LOW (ref 150–400)
RBC: 3.27 MIL/uL — ABNORMAL LOW (ref 4.22–5.81)
RDW: 18.7 % — ABNORMAL HIGH (ref 11.5–15.5)
WBC: 3.1 10*3/uL — ABNORMAL LOW (ref 4.0–10.5)
nRBC: 0 % (ref 0.0–0.2)

## 2021-07-30 LAB — TYPE AND SCREEN
ABO/RH(D): O POS
Antibody Screen: NEGATIVE
Unit division: 0
Unit division: 0

## 2021-07-30 LAB — MAGNESIUM: Magnesium: 1.7 mg/dL (ref 1.7–2.4)

## 2021-07-30 LAB — GLUCOSE, CAPILLARY
Glucose-Capillary: 62 mg/dL — ABNORMAL LOW (ref 70–99)
Glucose-Capillary: 70 mg/dL (ref 70–99)
Glucose-Capillary: 100 mg/dL — ABNORMAL HIGH (ref 70–99)

## 2021-07-30 MED ORDER — FAMOTIDINE 20 MG PO TABS: 20.0000 mg | ORAL_TABLET | Freq: Two times a day (BID) | ORAL | 1 refills | Status: AC

## 2021-07-30 MED ORDER — VANCOMYCIN HCL 125 MG PO CAPS: 125.0000 mg | ORAL_CAPSULE | Freq: Four times a day (QID) | ORAL | 0 refills | Status: AC

## 2021-07-30 NOTE — TOC Benefit Eligibility Note (Signed)
Patient Advocate Encounter ? ?Insurance verification completed.   ? ?The patient is currently admitted and upon discharge could be taking vancomycin 125 mg capsules. ? ?The current 10 day co-pay is, $1.30.  ? ?The patient is insured through [employer] Medicare Part D  ? ? ? ?Lyndel Safe, CPhT ?Pharmacy Patient Advocate Specialist ?[place] Patient Advocate Team ?Direct Number: [phone]  Fax: [phone] ? ? ? ? ? ?  ?

## 2021-07-30 NOTE — Progress Notes (Addendum)
Patient ID: Jeffrey [name], male   DOB: 11/13/1945, 76 y.o.   MRN: [id number] ?S: Feels well, no ncomplaints. ?O:BP (!) 162/68 (BP Location: Right Arm)   Pulse 80   Temp 97.7 ?F (36.5 ?C) (Oral)   Resp 17   Ht 5\' 7"  (1.702 m)   Wt 83.1 kg   SpO2 99%   BMI 28.69 kg/m?  ? ?Intake/Output Summary (Last 24 hours) at 07/30/2021 1044 ?Last data filed at 07/30/2021 1039 ?Gross per 24 hour  ?Intake 1225 ml  ?Output 3130 ml  ?Net -1905 ml  ? ?Intake/Output: ?I/O last 3 completed shifts: ?In: 6599 [P.O.:920; Blood:305] ?Out: 3570 [Urine:125; VXBLT:9030] ? Intake/Output this shift: ? Total I/O ?In: 240 [P.O.:240] ?Out: 50 [Urine:50] ?Weight change: -1.723 kg ?Gen: NAD ?CVS: RRR ?Resp:CTA ?Abd: +BS, soft, NT/ND ?Ext: no edema, LUE AVF +T/B ? ?Recent Labs  ?Lab 07/28/21 ?1307 07/29/21 ?0522 07/30/21 ?0923  ?NA 131* 133* 131*  ?K 3.1* 3.4* 3.9  ?CL 98 99 95*  ?CO2 21* 24 26  ?GLUCOSE 191* 96 68*  ?BUN 38* 41* 20  ?CREATININE 4.06* 4.32* 2.71*  ?ALBUMIN 2.7* 2.6* 3.0*  ?CALCIUM 8.5* 8.8* 8.6*  ?PHOS  --   --  2.1*  ?AST 25 20  --   ?ALT 20 17  --   ? ?Liver Function Tests: ?Recent Labs  ?Lab 07/28/21 ?1307 07/29/21 ?0522 07/30/21 ?3007  ?AST 25 20  --   ?ALT 20 17  --   ?ALKPHOS 137* 128*  --   ?BILITOT 0.6 0.5  --   ?PROT 6.3* 6.1*  --   ?ALBUMIN 2.7* 2.6* 3.0*  ? ?Recent Labs  ?Lab 07/28/21 ?1307  ?LIPASE 22  ? ?No results for input(s): AMMONIA in the last 168 hours. ?CBC: ?Recent Labs  ?Lab 07/28/21 ?1307 07/29/21 ?0522 07/30/21 ?6226  ?WBC 1.6* 2.1* 3.1*  ?NEUTROABS 1.3* 1.6*  --   ?HGB 6.6* 7.8* 10.3*  ?HCT 21.3* 24.5* 31.8*  ?MCV 97.7 98.0 97.2  ?PLT 51* 58* 70*  ? ?Cardiac Enzymes: ?No results for input(s): CKTOTAL, CKMB, CKMBINDEX, TROPONINI in the last 168 hours. ?CBG: ?Recent Labs  ?Lab 07/29/21 ?1650 07/29/21 ?1747 07/29/21 ?2210 07/30/21 ?0729 07/30/21 ?0959  ?GLUCAP 69* 85 92 62* 70  ? ? ?Iron Studies: No results for input(s): IRON, TIBC, TRANSFERRIN, FERRITIN in the last 72 hours. ?Studies/Results: ?DG Chest Port 1  View ? ?Result Date: 07/28/2021 ?CLINICAL DATA:  Provided history: Bibasilar crackles. Intermittent shortness of breath. Diarrhea. History of atrial fibrillation, cirrhosis, former smoker. EXAM: PORTABLE CHEST 1 VIEW COMPARISON:  CT angiogram chest 04/01/2020. Prior chest radiographs 04/01/2020 and earlier. FINDINGS: Cardiomegaly. Small bilateral pleural effusions. Superimposed opacity at the left lung base, which may reflect atelectasis and/or pneumonia. Mild compressive atelectasis at the right lung base. No evidence of pneumothorax. No acute bony abnormality identified. IMPRESSION: Cardiomegaly. Small bilateral pleural effusions. Superimposed left basilar atelectasis and/or pneumonia. Mild compressive atelectasis at the right lung base. Electronically Signed   By: Kellie Simmering D.O.   On: 07/28/2021 13:47   ? sodium chloride   Intravenous Once  ? ALPRAZolam  0.5 mg Oral Q M,W,F  ? Chlorhexidine Gluconate Cloth  6 each Topical Q0600  ? diltiazem  120 mg Oral Daily  ? furosemide  80 mg Oral Daily  ? gabapentin  100 mg Oral TID  ? hydrOXYzine  25 mg Oral TID  ? insulin aspart  0-15 Units Subcutaneous TID WC  ? insulin aspart  0-5 Units Subcutaneous QHS  ?  insulin glargine-yfgn  70 Units Subcutaneous QHS  ? levofloxacin  500 mg Oral Once per day on Mon Wed Fri  ? loratadine  10 mg Oral Daily  ? metoprolol succinate  25 mg Oral QHS  ? metoprolol succinate  25 mg Oral Once per day on Sun Tue Thu Sat  ? midodrine  10 mg Oral BID WC  ? mirtazapine  30 mg Oral Q supper  ? omega-3 acid ethyl esters  2 g Oral BID  ? pantoprazole  80 mg Oral Daily  ? pravastatin  20 mg Oral q AM  ? rifaximin  550 mg Oral BID  ? spironolactone  50 mg Oral q AM  ? thiamine  100 mg Oral Daily  ? vancomycin  125 mg Oral Q6H  ? venlafaxine  75 mg Oral TID WC  ? ? ?BMET ?   ?Component Value Date/Time  ? NA 131 (L) 07/30/2021 0517  ? K 3.9 07/30/2021 0517  ? CL 95 (L) 07/30/2021 0517  ? CO2 26 07/30/2021 0517  ? GLUCOSE 68 (L) 07/30/2021 0517  ? BUN  20 07/30/2021 0517  ? CREATININE 2.71 (H) 07/30/2021 0517  ? CREATININE 2.65 (H) 11/16/2019 1203  ? CALCIUM 8.6 (L) 07/30/2021 0517  ? CALCIUM 8.3 (L) 09/19/2020 1024  ? GFRNONAA 24 (L) 07/30/2021 0517  ? GFRNONAA 23 (L) 11/16/2019 1203  ? GFRAA 15 (L) 02/20/2020 0636  ? GFRAA 27 (L) 11/16/2019 1203  ? ?CBC ?   ?Component Value Date/Time  ? WBC 3.1 (L) 07/30/2021 0517  ? RBC 3.27 (L) 07/30/2021 0517  ? HGB 10.3 (L) 07/30/2021 0517  ? HCT 31.8 (L) 07/30/2021 0517  ? HCT 28 02/25/2012 1034  ? PLT 70 (L) 07/30/2021 0517  ? PLT 125 02/25/2012 1034  ? MCV 97.2 07/30/2021 0517  ? MCV 82.0 02/25/2012 1034  ? MCH 31.5 07/30/2021 0517  ? MCHC 32.4 07/30/2021 0517  ? RDW 18.7 (H) 07/30/2021 0517  ? LYMPHSABS 0.2 (L) 07/29/2021 0522  ? MONOABS 0.2 07/29/2021 0522  ? EOSABS 0.1 07/29/2021 0522  ? BASOSABS 0.0 07/29/2021 0522  ? ? ?Dialysis Orders: Center: [facility]  on MWF . ?EDW 85 kg HD Bath 2K/2.5Ca  Time 4 hours Heparin none. Access LUE AVF BFR 350  DFR 500    ?Micera 200 mcg IV every 2 weeks  Venofer  50 mg IV MWF  ?  ?Assessment/Plan: ? 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 ? Pancytopenia - multifactorial with ESRD, ESLD, and splenomegaly.  Followed by Hematology. ? Diarrhea - profuse watery stools for 3 days.  + C. Diff and GI panel + for Norovirus. ? ESRD -  s/p HD yesterday and get back on his MWF schedule soon. Hopefully at his outpatient HD unit. ? Hypertension/volume  - stable ? Anemia  -  as above.  For another transfusion today. ? 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 Nutrition - renal diet, carb modified ? Hypokalemia - will use added K bath with HD and follow. ? Hypomagnesemia - replete and follow.  Difficult since he has diarrhea and IV Mg not well absorbed unless given slowly over several hours. ?Disposition - hopeful discharge to home and can follow up with outpatient HD tomorrow. ? ?Donetta Potts, MD ?[facility] ?([phone] ? ?

## 2021-07-30 NOTE — Progress Notes (Signed)
Inpatient Diabetes Program Recommendations ? ?AACE/ADA: New Consensus Statement on Inpatient Glycemic Control (2015) ? ?Target Ranges:  Prepandial:   less than 140 mg/dL ?     Peak postprandial:   less than 180 mg/dL (1-2 hours) ?     Critically ill patients:  140 - 180 mg/dL  ? ? Latest Reference Range & Units 07/29/21 07:19 07/29/21 16:13 07/29/21 16:50 07/29/21 17:47 07/29/21 22:10  ?Glucose-Capillary 70 - 99 mg/dL 63 (L) 63 (L) 69 (L) 85 92  ? ? Latest Reference Range & Units 07/30/21 07:29  ?Glucose-Capillary 70 - 99 mg/dL 62 (L)  ? ? ? ? ?Home DM Meds: Tresiba 120 units qhs ?    Humalog 10-50 units tid ? ? ?Current Orders: Semglee 70 units QHS ?    Novolog Moderate Correction Scale/ SSI (0-15 units) TID AC + HS ? ? ? ?MD- Note Hypoglycemia yest AM, again at 4pm yesterday, and again this AM. ? ?Please note that Pt REFUSED Semglee insulin last PM and pt still with Hypoglycemia this AM ? ?Please consider: ? ?1. Reduce Semglee further to 40 units QHS (1/3 total home dose) ? ?2. Reduce Novolog SSI to the 0-9 unit Sensitive scale ? ? ? ?--Will follow patient during hospitalization-- ? ?Wyn Quaker RN, MSN, CDE ?Diabetes Coordinator ?Inpatient Glycemic Control Team ?Team Pager: [phone] (8a-5p) ? ? ?

## 2021-07-30 NOTE — Discharge Summary (Signed)
Physician Discharge Summary   Patient: Jeffrey [name] MRN: [id number] DOB: [birth year]  Admit date:     07/28/2021  Discharge date: 07/30/21  Discharge Physician: Barton Dubois   PCP: Monico Blitz, MD   Recommendations at discharge:  Repeat CBC to follow hemoglobin trend/stability Make sure patient has continued to follow-up with nephrology and gastroenterology service as instructed. Reassess complete resolution of patient's diarrhea in the setting of C. difficile infection.  Discharge Diagnoses: Principal Problem:   Symptomatic anemia Active Problems:   Liver cirrhosis (HCC)   Diarrhea in adult patient   Type 2 diabetes mellitus with stage 3 chronic kidney disease, with long-term current use of insulin (HCC)   Clostridioides difficile infection   Controlled type 2 diabetes mellitus with chronic kidney disease on chronic dialysis, with long-term current use of insulin ([place])   ESRD on dialysis ([place])   Hypoalbuminemia   Hypokalemia   Hypomagnesemia   Hospital Course: Per HPI: Jeffrey [name] is a 75 y.o. male with medical history significant of with history of C. difficile 6 years ago, atrial fibrillation, alcoholic cirrhosis, anemia, anxiety, cholelithiasis, B12 deficiency, diabetes mellitus type 2, peptic ulcer disease, GERD, hypertension, SBP, and more presents ED with chief complaint of diarrhea.  Patient reports that the diarrhea started 3 days ago.  He has been going 3-4 times per day.  He has been throwing up 4-5 times per day.  Anytime he tries to eat he feels nauseous.  He was taking Zofran, and it helped.  He took Imodium on Saturday, which helped temporarily.  He has had no blood in his stool or vomitus.  He reports associated left lower quadrant right lower quadrant cramping and sharp pain intermittently.  This pain did not present now but have been in the last few days.  Patient does still make urine, where the catheter bag.  Urine is dark yellow, he makes 300-600 mL/day.  Patient  denies any fever or shortness of breath.  He does have a orthopnea but reports that that is normal for him.  Patient did have C. difficile 6 years ago and reports that this does not feel like that.  He is taking Levaquin prophylactically at dialysis.  Patient has 1 sick contact-his wife-she is diarrhea lasted for approximately 24 hours.  Patient has no other complaints.  07/29/21: Patient was admitted for diarrhea and is noted to have C. difficile colitis.  Discussed case with GI and initiated vancomycin p.o. x10 days.  He is to have hemodialysis today per nephrology.  He also has transfusion dependent anemia and has received 1 unit PRBC with another unit due with dialysis today.  07/30/21: Patient improved/stable for discharge.  Hemoglobin up to 10.3 after transfusion.  Reports no abdominal pain, no nausea, no vomiting and already having increase forming consistency in his stools.  Feeling ready to go home and complete treatment with antibiotics as an outpatient.  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 -Fecal occult blood test negative -Patient regularly gets transfusions approximately every 1 to 2 months -Continue to follow hemoglobin trend and transfuse for hemoglobin less than 7.  Hypomagnesemia- (present on admission) -Repleted -Continue to follow electrolytes stability.  Hypokalemia- (present on admission) -Repleted/replace -Continue to follow electrolytes and further hemodialysis stabilization.  Hypoalbuminemia- (present on admission) -Albumin 2.7 likely secondary to p.o. intake and liver cirrhosis  encourage nutrient dense food choices -  Continue to monitor  ESRD on dialysis [facility]) -Appreciate nephrology evaluation and recommendations -Continue outpatient hemodialysis as previously scheduled. -IV iron infusion  and Epogen therapy as per nephrology discretion.  Diarrhea in adult patient -Patient with history of C. Difficile -and C. difficile is positive -Discussed with GI and vancomycin initiated x10 days -Continue to maintain adequate hydration and follow/replete electrolytes as needed. -PPI has been discontinued -Okay to continue the use of probiotic -Pepcid has been used instead of Protonix while actively treating C. difficile infection. -Continue outpatient follow-up with gastroenterology service.  Liver cirrhosis ([place])- (present on admission) -Continue Xifaxan -Continue home management with Lasix and spironolactone (patient is still producing urine) -Continue outpatient follow-up with gastroenterology service -In the setting of acute C. difficile infection patient's Levaquin SBP prophylaxis will be discontinued. -If further prophylaxis needed down the road we will recommend the use of a different antibiotics to minimize chances of C.Diff reactivation/recurrent infection.  Type 2 diabetes mellitus with chronic kidney disease (chronically on dialysis), with long-term current use of insulin ([place]) -resume home SSI  -not skipping meals and continue to follow A1C/CBG's  Chronic respiratory failure with hypoxia -Continue home chronic oxygen supplementation -Reports no shortness of breath currently.  Consultants: Nephrology and gastroenterology service. Procedures performed: Patient hemodialysis; see below for x-ray reports. Disposition: Stable and improved.  Patient discharged home with instruction to follow-up with PCP and resume outpatient HD. Diet recommendation: Heart healthy/modified carbohydrate diet.  DISCHARGE MEDICATION: Allergies as of 07/30/2021       Reactions   Tape Itching, Rash, Other (See Comments)   Tears skin        Medication List     STOP taking these medications    hydrocortisone 2.5 % rectal cream Commonly known as: Anusol-HC   lactulose 10 GM/15ML  solution Commonly known as: Constulose   levofloxacin 500 MG tablet Commonly known as: LEVAQUIN   milk thistle 175 MG tablet   pantoprazole 40 MG tablet Commonly known as: PROTONIX   tiZANidine 2 MG tablet Commonly known as: ZANAFLEX       TAKE these medications    acetaminophen 650 MG CR tablet Commonly known as: TYLENOL Take 650 mg by mouth every 8 (eight) hours as needed for pain.   Alcohol Prep 70 % Pads Apply 1 each topically 3 (three) times daily.   Alka-Seltzer Heartburn + Gas 750-80 MG Chew Generic drug: Calcium Carbonate-Simethicone Chew 1-2 tablets by mouth 2 (two) times daily as needed (acid reflux).   ALPRAZolam 0.5 MG tablet Commonly known as: XANAX Take 0.5 mg by mouth See admin instructions. Take one tablet (0.5 mg) by mouth on Monday, Wednesday, Friday before dialysis, may also take one tablet (0.5 mg) daily as needed for anxiety.   BD Pen Needle Micro U/F 32G X 6 MM Misc Generic drug: Insulin Pen Needle SMARTSIG:SUB-Q Daily PRN   calcium carbonate 500 MG chewable tablet Commonly known as: TUMS - dosed in mg elemental calcium Chew 1 tablet by mouth 3 (three) times daily with meals.   cetirizine 10 MG tablet Commonly known as: ZYRTEC Take 10 mg by mouth daily.   cholecalciferol 25 MCG (1000 UNIT) tablet Commonly known as: VITAMIN D3 Take 1,000 Units by mouth daily.   diltiazem 120 MG 24 hr capsule Commonly known as: TIAZAC Take 120 mg by mouth daily.   famotidine 20 MG tablet Commonly known as: Pepcid Take 1 tablet (20 mg total) by mouth 2 (two) times daily.   furosemide 40 MG tablet Commonly known as:  LASIX Take 80 mg by mouth daily.   gabapentin 100 MG capsule Commonly known as: NEURONTIN TAKE 1 CAPSULE BY MOUTH AT BEDTIME What changed: when to take this   Garlic 3662 MG Caps Take 1,000 mg by mouth daily.   GlucaGen HypoKit 1 MG Solr injection Generic drug: glucagon Inject 1 mg into the muscle once as needed for low blood  sugar.   HumaLOG KwikPen 100 UNIT/ML KwikPen Generic drug: insulin lispro 10-50 Units 3 (three) times daily. Sliding scale   hydrOXYzine 25 MG tablet Commonly known as: ATARAX Take 25 mg by mouth 3 (three) times daily. For itching   iron polysaccharides 150 MG capsule Commonly known as: NIFEREX Take 150 mg by mouth daily with supper.   lidocaine-prilocaine cream Commonly known as: EMLA Apply 2 application topically daily.   metoprolol succinate 25 MG 24 hr tablet Commonly known as: TOPROL-XL Take 1 tablet (25 mg total) by mouth See admin instructions. Take one tablet (25 mg) by mouth at bedtime every night and take one tablet (25 mg) in morning on  Sun, Tues, Thurs and Sat (non-dialysis days)   midodrine 10 MG tablet Commonly known as: PROAMATINE Take 1 tablet (10 mg total) by mouth 2 (two) times daily with a meal.   mirtazapine 30 MG tablet Commonly known as: REMERON Take 1 tablet (30 mg total) by mouth at bedtime. What changed: when to take this   multivitamin with minerals Tabs tablet Take 1 tablet by mouth in the morning.   omega-3 acid ethyl esters 1 g capsule Commonly known as: LOVAZA Take 2 g by mouth 2 (two) times daily.   ondansetron 4 MG tablet Commonly known as: ZOFRAN Take 4 mg by mouth every 8 (eight) hours as needed for nausea or vomiting.   oxyCODONE-acetaminophen 5-325 MG tablet Commonly known as: PERCOCET/ROXICET Take 1 tablet by mouth every 4 (four) hours as needed for moderate pain.   pravastatin 20 MG tablet Commonly known as: PRAVACHOL Take 20 mg by mouth in the morning.   PROBIOTIC DAILY PO Take 1 tablet by mouth in the morning.   spironolactone 50 MG tablet Commonly known as: ALDACTONE Take 50 mg by mouth in the morning.   thiamine 100 MG tablet Commonly known as: Vitamin B-1 Take 100 mg by mouth daily.   [name] Generic drug: insulin degludec Inject 120 Units into the skin at bedtime.    TRI-VIT/FLUORIDE/IRON PO Take 1 tablet by mouth daily.   triamcinolone cream 0.1 % Commonly known as: KENALOG Apply 1 application topically 2 (two) times daily as needed (dry skin Itching).   vancomycin 125 MG capsule Commonly known as: VANCOCIN Take 1 capsule (125 mg total) by mouth every 6 (six) hours for 9 days.   venlafaxine 75 MG tablet Commonly known as: EFFEXOR Take 75 mg by mouth 3 (three) times daily with meals.   vitamin C 1000 MG tablet Take 1,000 mg by mouth in the morning.   Xifaxan 550 MG Tabs tablet Generic drug: rifaximin TAKE 1 TABLET BY MOUTH TWICE DAILY What changed: how much to take        Follow-up Information     Monico Blitz, MD. Schedule an appointment as soon as possible for a visit in 10 day(s).   Specialty: Internal Medicine Contact information: [address]  [place] [address] [phone]                 Discharge Exam: Danley Danker Weights   07/28/21 1207  07/28/21 2011 07/29/21 1105  Weight: 84.8 kg 82.3 kg 83.1 kg   General exam: Alert, awake, oriented x 3; no nausea/vomiting. Stools are forming. No abdominal pain. Afebrile. Respiratory system: Good air movement bilaterally; no using accessory muscles.  Good saturation on chronic supplementation. Cardiovascular system:RRR. No murmurs, rubs, gallops.  No JVD. Gastrointestinal system: Abdomen is nontender to palpation; slightly distended.  Positive bowel sounds bilaterally.   Central nervous system: Alert and oriented. No focal neurological deficits. Extremities: No cyanosis or clubbing. Skin: No petechiae. Psychiatry: Mood & affect appropriate.    Condition at discharge: Stable and improved.  Discharged home with instruction to follow-up with PCP and gastroenterology service.  The results of significant diagnostics from this hospitalization (including imaging, microbiology, ancillary and laboratory) are listed below for reference.   Imaging Studies: [place] Dialysis Access  Result  Date: 07/07/2021 CLINICAL DATA:  Left upper extremity arteriovenous fistula. Left arm bruising. EXAM: ULTRASOUND DIALYSIS ACCESS TECHNIQUE: Grayscale, color Doppler, and duplex sonographic imaging was performed of the left upper extremity dialysis fistula. COMPARISON:  None. FINDINGS: Left upper extremity brachiocephalic dialysis fistula is identified. Limited images of the inflow brachial artery demonstrate wide patency of the vessel with no significant atherosclerotic plaque identified. Expected low resistance arterial waveform noted proximal to the arteriovenous anastomosis. Vessel diameter of 6 mm. The arteriovenous anastomosis is widely patent with a diameter of 5 mm. Peak time average velocity at the anastomosis of 225 centimeters/second. Estimated flow rate of 2158 cc/minute. The outflow vein demonstrates no focal stenosis or aneurysm on the presented images. The vessel is uniformly within 2-3 mm of the skin surface. Measured diameter within the proximal, mid, and distal peripheral venous circuit is 8 mm, 12 mm, and 9 mm, respectively. Peak measured flow rate is 3253 cc/minute. IMPRESSION: Widely patent left upper extremity brachiocephalic dialysis fistula. Peak flow rate at the anastomosis is 2158 cc/minute. Electronically Signed   By: Fidela Salisbury M.D.   On: 07/07/2021 22:34   DG Chest Port 1 View  Result Date: 07/28/2021 CLINICAL DATA:  Provided history: Bibasilar crackles. Intermittent shortness of breath. Diarrhea. History of atrial fibrillation, cirrhosis, former smoker. EXAM: PORTABLE CHEST 1 VIEW COMPARISON:  CT angiogram chest 04/01/2020. Prior chest radiographs 04/01/2020 and earlier. FINDINGS: Cardiomegaly. Small bilateral pleural effusions. Superimposed opacity at the left lung base, which may reflect atelectasis and/or pneumonia. Mild compressive atelectasis at the right lung base. No evidence of pneumothorax. No acute bony abnormality identified. IMPRESSION: Cardiomegaly. Small bilateral  pleural effusions. Superimposed left basilar atelectasis and/or pneumonia. Mild compressive atelectasis at the right lung base. Electronically Signed   By: Kellie Simmering D.O.   On: 07/28/2021 13:47    Microbiology: Results for orders placed or performed during the hospital encounter of 07/28/21  Resp Panel by RT-PCR (Flu A&B, Covid) Nasopharyngeal Swab     Status: None   Collection Time: 07/28/21  3:40 PM   Specimen: Nasopharyngeal Swab; Nasopharyngeal(NP) swabs in vial transport medium  Result Value Ref Range Status   SARS Coronavirus 2 by RT PCR NEGATIVE NEGATIVE Final    Comment: (NOTE) SARS-CoV-2 target nucleic acids are NOT DETECTED.  The SARS-CoV-2 RNA is generally detectable in upper respiratory specimens during the acute phase of infection. The lowest concentration of SARS-CoV-2 viral copies this assay can detect is 138 copies/mL. A negative result does not preclude SARS-Cov-2 infection and should not be used as the sole basis for treatment or other patient management decisions. A negative result may occur with  improper specimen collection/handling,  submission of specimen other than nasopharyngeal swab, presence of viral mutation(s) within the areas targeted by this assay, and inadequate number of viral copies(<138 copies/mL). A negative result must be combined with clinical observations, patient history, and epidemiological information. The expected result is Negative.  Fact Sheet for Patients:  EntrepreneurPulse.com.au  Fact Sheet for Healthcare Providers:  IncredibleEmployment.be  This test is no t yet approved or cleared by the [place] FDA and  has been authorized for detection and/or diagnosis of SARS-CoV-2 by FDA under an Emergency Use Authorization (EUA). This EUA will remain  in effect (meaning this test can be used) for the duration of the COVID-19 declaration under Section 564(b)(1) of the Act, 21 U.S.C.section  360bbb-3(b)(1), unless the authorization is terminated  or revoked sooner.       Influenza A by PCR NEGATIVE NEGATIVE Final   Influenza B by PCR NEGATIVE NEGATIVE Final    Comment: (NOTE) The Xpert Xpress SARS-CoV-2/FLU/RSV plus assay is intended as an aid in the diagnosis of influenza from Nasopharyngeal swab specimens and should not be used as a sole basis for treatment. Nasal washings and aspirates are unacceptable for Xpert Xpress SARS-CoV-2/FLU/RSV testing.  Fact Sheet for Patients: EntrepreneurPulse.com.au  Fact Sheet for Healthcare Providers: IncredibleEmployment.be  This test is not yet approved or cleared by the [place] FDA and has been authorized for detection and/or diagnosis of SARS-CoV-2 by FDA under an Emergency Use Authorization (EUA). This EUA will remain in effect (meaning this test can be used) for the duration of the COVID-19 declaration under Section 564(b)(1) of the Act, 21 U.S.C. section 360bbb-3(b)(1), unless the authorization is terminated or revoked.  Performed at [facility], [address], [place], [place] [address]   C Difficile Quick Screen w PCR reflex     Status: Abnormal   Collection Time: 07/28/21  7:47 PM   Specimen: STOOL  Result Value Ref Range Status   C Diff antigen POSITIVE (A) NEGATIVE Final    Comment: CRITICAL RESULT CALLED TO, READ BACK BY AND VERIFIED WITH: MUSA J 07/28/21 AT 2047 BY ADGER J    C Diff toxin NEGATIVE NEGATIVE Final   C Diff interpretation Results are indeterminate. See PCR results.  Final    Comment: Performed at [facility], [address], [place], [place] [address]  Gastrointestinal Panel by PCR , Stool     Status: Abnormal   Collection Time: 07/28/21  7:47 PM   Specimen: STOOL  Result Value Ref Range Status   Campylobacter species NOT DETECTED NOT DETECTED Final   Plesimonas shigelloides NOT DETECTED NOT DETECTED Final   Salmonella species NOT DETECTED NOT DETECTED  Final   Yersinia enterocolitica NOT DETECTED NOT DETECTED Final   Vibrio species NOT DETECTED NOT DETECTED Final   Vibrio cholerae NOT DETECTED NOT DETECTED Final   Enteroaggregative E coli (EAEC) NOT DETECTED NOT DETECTED Final   Enteropathogenic E coli (EPEC) NOT DETECTED NOT DETECTED Final   Enterotoxigenic E coli (ETEC) NOT DETECTED NOT DETECTED Final   Shiga like toxin producing E coli (STEC) NOT DETECTED NOT DETECTED Final   Shigella/Enteroinvasive E coli (EIEC) NOT DETECTED NOT DETECTED Final   Cryptosporidium NOT DETECTED NOT DETECTED Final   Cyclospora cayetanensis NOT DETECTED NOT DETECTED Final   Entamoeba histolytica NOT DETECTED NOT DETECTED Final   Giardia lamblia NOT DETECTED NOT DETECTED Final   Adenovirus F40/41 NOT DETECTED NOT DETECTED Final   Astrovirus NOT DETECTED NOT DETECTED Final   Norovirus GI/GII DETECTED (A) NOT DETECTED Final  Comment: CRITICAL RESULT CALLED TO, READ BACK BY AND VERIFIED WITH: TIFFANY HAIRSTON @ 2876 ON 07/29/2021.Marland KitchenMarland KitchenTKR    Rotavirus A NOT DETECTED NOT DETECTED Final   Sapovirus (I, II, IV, and V) NOT DETECTED NOT DETECTED Final    Comment: Performed at [facility], [address] [place], [place] [address]  C. Diff by PCR, Reflexed     Status: Abnormal   Collection Time: 07/28/21  7:47 PM  Result Value Ref Range Status   Toxigenic C. Difficile by PCR POSITIVE (A) NEGATIVE Final    Comment: Positive for toxigenic C. difficile with little to no toxin production. Only treat if clinical presentation suggests symptomatic illness. Performed at [facility], [place] [address] [place], [place] [address]     Labs: CBC: Recent Labs  Lab 07/28/21 1307 07/29/21 0522 07/30/21 0517  WBC 1.6* 2.1* 3.1*  NEUTROABS 1.3* 1.6*  --   HGB 6.6* 7.8* 10.3*  HCT 21.3* 24.5* 31.8*  MCV 97.7 98.0 97.2  PLT 51* 58* 70*   Basic Metabolic Panel: Recent Labs  Lab 07/28/21 1307 07/29/21 0522 07/30/21 0517  NA 131* 133* 131*  K 3.1*  3.4* 3.9  CL 98 99 95*  CO2 21* 24 26  GLUCOSE 191* 96 68*  BUN 38* 41* 20  CREATININE 4.06* 4.32* 2.71*  CALCIUM 8.5* 8.8* 8.6*  MG 1.5* 1.6* 1.7  PHOS  --   --  2.1*   Liver Function Tests: Recent Labs  Lab 07/28/21 1307 07/29/21 0522 07/30/21 0517  AST 25 20  --   ALT 20 17  --   ALKPHOS 137* 128*  --   BILITOT 0.6 0.5  --   PROT 6.3* 6.1*  --   ALBUMIN 2.7* 2.6* 3.0*   CBG: Recent Labs  Lab 07/29/21 1747 07/29/21 2210 07/30/21 0729 07/30/21 0959 07/30/21 1106  GLUCAP 85 92 62* 70 100*    Discharge time spent: greater than 30 minutes.  Signed: Barton Dubois, MD [facility] 07/30/2021

## 2021-07-31 ENCOUNTER — Ambulatory Visit (HOSPITAL_COMMUNITY): Payer: Medicare Other

## 2021-07-31 ENCOUNTER — Inpatient Hospital Stay (HOSPITAL_COMMUNITY): Payer: Medicare Other

## 2021-07-31 ENCOUNTER — Telehealth: Payer: Self-pay | Admitting: *Deleted

## 2021-07-31 LAB — HEPATITIS B SURFACE ANTIBODY, QUANTITATIVE: Hep B S AB Quant (Post): 43.9 m[IU]/mL (ref >9.9)

## 2021-07-31 NOTE — Telephone Encounter (Signed)
Jeffrey [name] was contacted by telephone and spoke to wife Jeffrey [name] to verify understanding of discharge instructions status post their most recent discharge from the hospital on the date:  07/30/21.  Inpatient discharge AVS was re-reviewed with patient, along with cancer center appointments.  Verification of understanding for oncology specific follow-up was validated using the Teach Back method.  Appointments for today were moved to next week due to c diff diagnosis. ? ?Transportation to appointments were confirmed for the patient as being self/caregiver. ? ?Jeffrey [name]?s questions were addressed to their satisfaction upon completion of this post discharge follow-up call for outpatient oncology.  ?

## 2021-08-01 ENCOUNTER — Other Ambulatory Visit: Payer: Self-pay | Admitting: *Deleted

## 2021-08-01 DIAGNOSIS — D500 Iron deficiency anemia secondary to blood loss (chronic): Secondary | ICD-10-CM

## 2021-08-01 DIAGNOSIS — D649 Anemia, unspecified: Secondary | ICD-10-CM

## 2021-08-07 ENCOUNTER — Other Ambulatory Visit: Payer: Self-pay

## 2021-08-07 ENCOUNTER — Inpatient Hospital Stay (HOSPITAL_COMMUNITY): Payer: Medicare Other | Attending: Hematology

## 2021-08-07 ENCOUNTER — Ambulatory Visit (INDEPENDENT_AMBULATORY_CARE_PROVIDER_SITE_OTHER): Payer: Medicare Other

## 2021-08-07 ENCOUNTER — Encounter (HOSPITAL_COMMUNITY): Payer: Self-pay

## 2021-08-07 ENCOUNTER — Ambulatory Visit (HOSPITAL_COMMUNITY): Payer: Medicare Other

## 2021-08-07 ENCOUNTER — Inpatient Hospital Stay (HOSPITAL_COMMUNITY): Payer: Medicare Other

## 2021-08-07 VITALS — BP 174/73 | HR 78 | Temp 97.0°F | Resp 18

## 2021-08-07 DIAGNOSIS — D500 Iron deficiency anemia secondary to blood loss (chronic): Secondary | ICD-10-CM

## 2021-08-07 DIAGNOSIS — E538 Deficiency of other specified B group vitamins: Secondary | ICD-10-CM | POA: Diagnosis present

## 2021-08-07 DIAGNOSIS — R339 Retention of urine, unspecified: Secondary | ICD-10-CM | POA: Diagnosis not present

## 2021-08-07 DIAGNOSIS — D649 Anemia, unspecified: Secondary | ICD-10-CM

## 2021-08-07 LAB — CBC WITH DIFFERENTIAL/PLATELET
Abs Immature Granulocytes: 0.02 10*3/uL (ref 0.00–0.07)
Basophils Absolute: 0 10*3/uL (ref 0.0–0.1)
Basophils Relative: 1 %
Eosinophils Absolute: 0.2 10*3/uL (ref 0.0–0.5)
Eosinophils Relative: 5 %
HCT: 27.9 % — ABNORMAL LOW (ref 39.0–52.0)
Hemoglobin: 8.7 g/dL — ABNORMAL LOW (ref 13.0–17.0)
Immature Granulocytes: 1 %
Lymphocytes Relative: 8 %
Lymphs Abs: 0.3 10*3/uL — ABNORMAL LOW (ref 0.7–4.0)
MCH: 31.1 pg (ref 26.0–34.0)
MCHC: 31.2 g/dL (ref 30.0–36.0)
MCV: 99.6 fL (ref 80.0–100.0)
Monocytes Absolute: 0.4 10*3/uL (ref 0.1–1.0)
Monocytes Relative: 12 %
Neutro Abs: 2.4 10*3/uL (ref 1.7–7.7)
Neutrophils Relative %: 73 %
Platelets: 113 10*3/uL — ABNORMAL LOW (ref 150–400)
RBC: 2.8 MIL/uL — ABNORMAL LOW (ref 4.22–5.81)
RDW: 18.9 % — ABNORMAL HIGH (ref 11.5–15.5)
WBC: 3.3 10*3/uL — ABNORMAL LOW (ref 4.0–10.5)
nRBC: 0 % (ref 0.0–0.2)

## 2021-08-07 LAB — SAMPLE TO BLOOD BANK

## 2021-08-07 MED ORDER — CYANOCOBALAMIN 1000 MCG/ML IJ SOLN
1000.0000 ug | Freq: Once | INTRAMUSCULAR | Status: AC
  Administered 2021-08-07: 09:00:00 1000 ug via INTRAMUSCULAR
  Filled 2021-08-07: qty 1

## 2021-08-07 NOTE — Progress Notes (Signed)
Cath Change/ Replacement ? ?Patient is present today for a catheter change due to urinary retention.  41m of water was removed from the balloon, a 16FR foley cath was removed with out difficulty.  Patient was cleaned and prepped in a sterile fashion with betadine. A 16 FR Cou[place] foley cath was replaced into the bladder no complications were noted After flushing with 631mns Urine return was noted  and urine was yellow in color. The balloon was filled with 1035mf sterile water. A leg bag was attached for drainage.  A night bag was also given to the patient and patient was given instruction on how to change from one bag to another. Patient was given proper instruction on catheter care.   ? ?Performed by: KouLevi AlandA ? ?Follow up: follow up as scheduled  ?

## 2021-08-07 NOTE — Patient Instructions (Signed)
[facility]  Discharge Instructions: °Thank you for choosing [facility] to provide your oncology and hematology care.  °If you have a lab appointment with the Cancer Center, please come in thru the Main Entrance and check in at the main information desk. ° °Wear comfortable clothing and clothing appropriate for easy access to any Portacath or PICC line.  ° °We strive to give you quality time with your provider. You may need to reschedule your appointment if you arrive late (15 or more minutes).  Arriving late affects you and other patients whose appointments are after yours.  Also, if you miss three or more appointments without notifying the office, you may be dismissed from the clinic at the provider’s discretion.    °  °For prescription refill requests, have your pharmacy contact our office and allow 72 hours for refills to be completed.   ° °Today you received the following B12 injection.     °  °To help prevent nausea and vomiting after your treatment, we encourage you to take your nausea medication as directed. ° °BELOW ARE SYMPTOMS THAT SHOULD BE REPORTED IMMEDIATELY: °*FEVER GREATER THAN 100.4 F (38 °C) OR HIGHER °*CHILLS OR SWEATING °*NAUSEA AND VOMITING THAT IS NOT CONTROLLED WITH YOUR NAUSEA MEDICATION °*UNUSUAL SHORTNESS OF BREATH °*UNUSUAL BRUISING OR BLEEDING °*URINARY PROBLEMS (pain or burning when urinating, or frequent urination) °*BOWEL PROBLEMS (unusual diarrhea, constipation, pain near the anus) °TENDERNESS IN MOUTH AND THROAT WITH OR WITHOUT PRESENCE OF ULCERS (sore throat, sores in mouth, or a toothache) °UNUSUAL RASH, SWELLING OR PAIN  °UNUSUAL VAGINAL DISCHARGE OR ITCHING  ° °Items with * indicate a potential emergency and should be followed up as soon as possible or go to the Emergency Department if any problems should occur. ° °Please show the CHEMOTHERAPY ALERT CARD or IMMUNOTHERAPY ALERT CARD at check-in to the Emergency Department and triage nurse. ° °Should  you have questions after your visit or need to cancel or reschedule your appointment, please contact [facility] [phone]  and follow the prompts.  Office hours are 8:00 a.m. to 4:30 p.m. Monday - Friday. Please note that voicemails left after 4:00 p.m. may not be returned until the following business day.  We are closed weekends and major holidays. You have access to a nurse at all times for urgent questions. Please call the main number to the clinic [phone] and follow the prompts. ° °For any non-urgent questions, you may also contact your provider using MyChart. We now offer e-Visits for anyone 18 and older to request care online for non-urgent symptoms. For details visit [email or web address]. °  °Also download the MyChart app! Go to the app store, search "MyChart", open the app, select , and log in with your MyChart username and password. ° °Due to Covid, a mask is required upon entering the hospital/clinic. If you do not have a mask, one will be given to you upon arrival. For doctor visits, patients may have 1 support person aged 18 or older with them. For treatment visits, patients cannot have anyone with them due to current Covid guidelines and our immunocompromised population.  °

## 2021-08-07 NOTE — Patient Instructions (Signed)

## 2021-08-07 NOTE — Progress Notes (Signed)
Patient presents today for possible blood products and B12 injection. MAR reviewed and updated. HGB 8.7. No blood products needed today. Patient has no complaints today.  ? ?Jeffrey [name] presents today for injection per the provider's orders.  B12 administration without incident; injection site WNL; see MAR for injection details.  Patient tolerated procedure well and without incident.  No questions or complaints noted at this time. Discharged from clinic by wheel chair in stable condition. Alert and oriented x 3. F/U with [facility] as scheduled.   ? ? ?

## 2021-08-10 ENCOUNTER — Encounter (HOSPITAL_COMMUNITY): Payer: Self-pay

## 2021-08-10 ENCOUNTER — Emergency Department (HOSPITAL_COMMUNITY): Payer: Medicare Other

## 2021-08-10 ENCOUNTER — Emergency Department (HOSPITAL_COMMUNITY)
Admission: EM | Admit: 2021-08-10 | Discharge: 2021-08-10 | Disposition: A | Discharge status: Home / Self Care | Payer: Medicare Other | Attending: Emergency Medicine | Admitting: Emergency Medicine

## 2021-08-10 ENCOUNTER — Other Ambulatory Visit: Payer: Self-pay

## 2021-08-10 DIAGNOSIS — Z79899 Other long term (current) drug therapy: Secondary | ICD-10-CM | POA: Insufficient documentation

## 2021-08-10 DIAGNOSIS — E1122 Type 2 diabetes mellitus with diabetic chronic kidney disease: Secondary | ICD-10-CM | POA: Insufficient documentation

## 2021-08-10 DIAGNOSIS — R42 Dizziness and giddiness: Secondary | ICD-10-CM | POA: Diagnosis present

## 2021-08-10 DIAGNOSIS — N186 End stage renal disease: Secondary | ICD-10-CM | POA: Diagnosis not present

## 2021-08-10 DIAGNOSIS — Z7984 Long term (current) use of oral hypoglycemic drugs: Secondary | ICD-10-CM | POA: Diagnosis not present

## 2021-08-10 DIAGNOSIS — Z794 Long term (current) use of insulin: Secondary | ICD-10-CM | POA: Diagnosis not present

## 2021-08-10 DIAGNOSIS — I10 Essential (primary) hypertension: Secondary | ICD-10-CM

## 2021-08-10 DIAGNOSIS — D500 Iron deficiency anemia secondary to blood loss (chronic): Secondary | ICD-10-CM | POA: Diagnosis not present

## 2021-08-10 DIAGNOSIS — I120 Hypertensive chronic kidney disease with stage 5 chronic kidney disease or end stage renal disease: Secondary | ICD-10-CM | POA: Diagnosis not present

## 2021-08-10 DIAGNOSIS — D649 Anemia, unspecified: Secondary | ICD-10-CM

## 2021-08-10 DIAGNOSIS — Z992 Dependence on renal dialysis: Secondary | ICD-10-CM

## 2021-08-10 LAB — CBC
HCT: 30.2 % — ABNORMAL LOW (ref 39.0–52.0)
Hemoglobin: 9.3 g/dL — ABNORMAL LOW (ref 13.0–17.0)
MCH: 30.9 pg (ref 26.0–34.0)
MCHC: 30.8 g/dL (ref 30.0–36.0)
MCV: 100.3 fL — ABNORMAL HIGH (ref 80.0–100.0)
Platelets: 138 10*3/uL — ABNORMAL LOW (ref 150–400)
RBC: 3.01 MIL/uL — ABNORMAL LOW (ref 4.22–5.81)
RDW: 19.1 % — ABNORMAL HIGH (ref 11.5–15.5)
WBC: 4.9 10*3/uL (ref 4.0–10.5)
nRBC: 0 % (ref 0.0–0.2)

## 2021-08-10 LAB — HEPATIC FUNCTION PANEL
ALT: 22 U/L (ref 0–44)
AST: 24 U/L (ref 15–41)
Albumin: 3 g/dL — ABNORMAL LOW (ref 3.5–5.0)
Alkaline Phosphatase: 249 U/L — ABNORMAL HIGH (ref 38–126)
Bilirubin, Direct: 0.4 mg/dL — ABNORMAL HIGH (ref 0.0–0.2)
Indirect Bilirubin: 0.5 mg/dL (ref 0.3–0.9)
Total Bilirubin: 0.9 mg/dL (ref 0.3–1.2)
Total Protein: 7.5 g/dL (ref 6.5–8.1)

## 2021-08-10 LAB — BASIC METABOLIC PANEL
Anion gap: 10 (ref 5–15)
BUN: 23 mg/dL (ref 8–23)
CO2: 26 mmol/L (ref 22–32)
Calcium: 9.9 mg/dL (ref 8.9–10.3)
Chloride: 96 mmol/L — ABNORMAL LOW (ref 98–111)
Creatinine, Ser: 3.09 mg/dL — ABNORMAL HIGH (ref 0.61–1.24)
GFR, Estimated: 20 mL/min — ABNORMAL LOW (ref >60)
Glucose, Bld: 207 mg/dL — ABNORMAL HIGH (ref 70–99)
Potassium: 4.1 mmol/L (ref 3.5–5.1)
Sodium: 132 mmol/L — ABNORMAL LOW (ref 135–145)

## 2021-08-10 LAB — MAGNESIUM: Magnesium: 1.8 mg/dL (ref 1.7–2.4)

## 2021-08-10 LAB — AMMONIA: Ammonia: 15 umol/L (ref 9–35)

## 2021-08-10 LAB — PROTIME-INR
INR: 1.2 (ref 0.8–1.2)
Prothrombin Time: 15.2 seconds (ref 11.4–15.2)

## 2021-08-10 LAB — TROPONIN I (HIGH SENSITIVITY)
Troponin I (High Sensitivity): 25 ng/L — ABNORMAL HIGH (ref <18)
Troponin I (High Sensitivity): 24 ng/L — ABNORMAL HIGH (ref <18)

## 2021-08-10 MED ORDER — SODIUM CHLORIDE 0.9 % IV SOLN
Freq: Once | INTRAVENOUS | Status: AC
  Administered 2021-08-10: 1000 mL via INTRAVENOUS

## 2021-08-10 NOTE — ED Triage Notes (Signed)
Pt bib ems from home for htn.  Pt scheduled for dialysis tomorrow.  Bgl 251 by ems.  Denies cp.  Resp even and unlabored.  Skin warm and dry.  Pt states that he took bp pills this morning.   ?

## 2021-08-10 NOTE — ED Provider Notes (Signed)
[facility] EMERGENCY DEPARTMENT Provider Note   CSN: [id number] Arrival date & time: 08/10/21  1125     History  Chief Complaint  Patient presents with   Hypertension    Jeffrey [name] is a 76 y.o. male.  Pt is a 76 yo male with a hx of ESRD on HD MWF, liver cirrhosis, DM2, hypokalemia, hypomagnesemia and a recent admission from 2/27-3/1 for c.diff with anemia requiring a blood transfusion.  Pt said he felt dizzy when he stood up today.  He checked his blood pressure and it was elevated.  Pt had a home health nurse who told him to call EMS.  Pt took his bp meds prior to EMS arrival.  He has been compliant with meds and with dialysis.  He does not feel any more sob than usual.  He denies cp, ha, fever, cough.      Home Medications Prior to Admission medications   Medication Sig Start Date End Date Taking? Authorizing Provider  acetaminophen (TYLENOL) 650 MG CR tablet Take 650 mg by mouth every 8 (eight) hours as needed for pain.    [provider]  Alcohol Swabs (ALCOHOL PREP) 70 % PADS Apply 1 each topically 3 (three) times daily. 01/31/20   [provider]  ALPRAZolam Duanne Moron) 0.5 MG tablet Take 0.5 mg by mouth See admin instructions. Take one tablet (0.5 mg) by mouth on Monday, Wednesday, Friday before dialysis, may also take one tablet (0.5 mg) daily as needed for anxiety. 09/21/20   [provider]  Ascorbic Acid (VITAMIN C) 1000 MG tablet Take 1,000 mg by mouth in the morning.    [provider]  BD PEN NEEDLE MICRO U/F 32G X 6 MM MISC SMARTSIG:SUB-Q Daily PRN 04/23/21   [provider]  calcium carbonate (TUMS - DOSED IN MG ELEMENTAL CALCIUM) 500 MG chewable tablet Chew 1 tablet by mouth 3 (three) times daily with meals.    [provider]  Calcium Carbonate-Simethicone (ALKA-SELTZER HEARTBURN + GAS) 750-80 MG CHEW Chew 1-2 tablets by mouth 2 (two) times daily as needed (acid reflux).    [provider]  cetirizine  (ZYRTEC) 10 MG tablet Take 10 mg by mouth daily. 06/24/21   [provider]  cholecalciferol (VITAMIN D3) 25 MCG (1000 UNIT) tablet Take 1,000 Units by mouth daily.    [provider]  diltiazem (TIAZAC) 120 MG 24 hr capsule Take 120 mg by mouth daily. 05/28/20   [provider]  famotidine (PEPCID) 20 MG tablet Take 1 tablet (20 mg total) by mouth 2 (two) times daily. 07/30/21 07/30/22  Barton Dubois, MD  furosemide (LASIX) 40 MG tablet Take 80 mg by mouth daily. 12/29/19   [provider]  gabapentin (NEURONTIN) 100 MG capsule TAKE 1 CAPSULE BY MOUTH AT BEDTIME Patient taking differently: Take 100 mg by mouth 3 (three) times daily. 05/28/20   Waynetta Sandy, MD  Garlic 9798 MG CAPS Take 1,000 mg by mouth daily.    [provider]  GLUCAGEN HYPOKIT 1 MG SOLR injection Inject 1 mg into the muscle once as needed for low blood sugar. 11/28/20   [provider]  HUMALOG KWIKPEN 100 UNIT/ML KwikPen 10-50 Units 3 (three) times daily. Sliding scale 04/23/21   [provider]  hydrOXYzine (ATARAX/VISTARIL) 25 MG tablet Take 25 mg by mouth 3 (three) times daily. For itching 11/23/20   [provider]  iron polysaccharides (NIFEREX) 150 MG capsule Take 150 mg by mouth daily with supper.  [provider]  lidocaine-prilocaine (EMLA) cream Apply 2 application topically daily. 06/11/21   [provider]  metoprolol succinate (TOPROL-XL) 25 MG 24 hr tablet Take 1 tablet (25 mg total) by mouth See admin instructions. Take one tablet (25 mg) by mouth at bedtime every night and take one tablet (25 mg) in morning on  Sun, Tues, Thurs and Sat (non-dialysis days) 04/15/21   Murlean Iba, MD  midodrine (PROAMATINE) 10 MG tablet Take 1 tablet (10 mg total) by mouth 2 (two) times daily with a meal. 04/06/20   Tat, Shanon Brow, MD  mirtazapine (REMERON) 30 MG tablet Take 1 tablet (30 mg total) by mouth at bedtime. Patient taking  differently: Take 30 mg by mouth daily with supper. 02/10/20   Roxan Hockey, MD  Multiple Vitamin (MULTIVITAMIN WITH MINERALS) TABS tablet Take 1 tablet by mouth in the morning.     [provider]  omega-3 acid ethyl esters (LOVAZA) 1 g capsule Take 2 g by mouth 2 (two) times daily.  04/07/19   [provider]  ondansetron (ZOFRAN) 4 MG tablet Take 4 mg by mouth every 8 (eight) hours as needed for nausea or vomiting.    [provider]  oxyCODONE-acetaminophen (PERCOCET/ROXICET) 5-325 MG tablet Take 1 tablet by mouth every 4 (four) hours as needed for moderate pain. 02/27/20   [provider]  Ped Vitamins ACD Fl-Iron (TRI-VIT/FLUORIDE/IRON PO) Take 1 tablet by mouth daily. 08/01/19   [provider]  pravastatin (PRAVACHOL) 20 MG tablet Take 20 mg by mouth in the morning.     [provider]  Probiotic Product (PROBIOTIC DAILY PO) Take 1 tablet by mouth in the morning.     [provider]  spironolactone (ALDACTONE) 50 MG tablet Take 50 mg by mouth in the morning.     [provider]  thiamine (VITAMIN B-1) 100 MG tablet Take 100 mg by mouth daily. 08/27/20   [provider]  Thiamine HCl (B-1) 100 MG TABS TAKE 1 TABLET BY MOUTH EVERY DAY 03/24/21   [provider]  TRESIBA FLEXTOUCH 200 UNIT/ML FlexTouch Pen Inject 120 Units into the skin at bedtime. 04/22/21   [provider]  triamcinolone cream (KENALOG) 0.1 % Apply 1 application topically 2 (two) times daily as needed (dry skin Itching). 12/30/17   [provider]  venlafaxine (EFFEXOR) 75 MG tablet Take 75 mg by mouth 3 (three) times daily with meals.     [provider]  XIFAXAN 550 MG TABS tablet TAKE 1 TABLET BY MOUTH TWICE DAILY Patient taking differently: Take 550 mg by mouth 2 (two) times daily. 02/13/21   Mahala Menghini, PA-C      Allergies    Tape    Review of Systems   Review of Systems  Neurological:  Positive for  dizziness.  All other systems reviewed and are negative.  Physical Exam Updated Vital Signs BP (!) 183/86    Pulse 76    Temp 98.8 F (37.1 C)    Resp 18    Ht '5\' 7"'$  (1.702 m)    Wt 86.2 kg    SpO2 100%    BMI 29.76 kg/m  Physical Exam Vitals and nursing note reviewed.  Constitutional:      Appearance: Normal appearance.  HENT:     Head: Normocephalic and atraumatic.     Right Ear: External ear normal.     Left Ear: External ear normal.     Nose: Nose normal.  Mouth/Throat:     Mouth: Mucous membranes are dry.     Pharynx: Oropharynx is clear.  Eyes:     Extraocular Movements: Extraocular movements intact.     Conjunctiva/sclera: Conjunctivae normal.     Pupils: Pupils are equal, round, and reactive to light.  Cardiovascular:     Rate and Rhythm: Normal rate and regular rhythm.     Pulses: Normal pulses.     Heart sounds: Normal heart sounds.  Pulmonary:     Effort: Pulmonary effort is normal.     Breath sounds: Normal breath sounds.  Abdominal:     General: Abdomen is flat. Bowel sounds are normal.     Palpations: Abdomen is soft.  Musculoskeletal:     Cervical back: Normal range of motion and neck supple.     Comments: LUE with AVF and + thrill  Skin:    General: Skin is warm.     Capillary Refill: Capillary refill takes less than 2 seconds.  Neurological:     General: No focal deficit present.     Mental Status: He is alert and oriented to person, place, and time.  Psychiatric:        Mood and Affect: Mood normal.        Behavior: Behavior normal.    ED Results / Procedures / Treatments   Labs (all labs ordered are listed, but only abnormal results are displayed) Labs Reviewed  BASIC METABOLIC PANEL - Abnormal; Notable for the following components:      Result Value   Sodium 132 (*)    Chloride 96 (*)    Glucose, Bld 207 (*)    Creatinine, Ser 3.09 (*)    GFR, Estimated 20 (*)    All other components within normal limits  CBC - Abnormal; Notable for  the following components:   RBC 3.01 (*)    Hemoglobin 9.3 (*)    HCT 30.2 (*)    MCV 100.3 (*)    RDW 19.1 (*)    Platelets 138 (*)    All other components within normal limits  HEPATIC FUNCTION PANEL - Abnormal; Notable for the following components:   Albumin 3.0 (*)    Alkaline Phosphatase 249 (*)    Bilirubin, Direct 0.4 (*)    All other components within normal limits  TROPONIN I (HIGH SENSITIVITY) - Abnormal; Notable for the following components:   Troponin I (High Sensitivity) 25 (*)    All other components within normal limits  AMMONIA  PROTIME-INR  MAGNESIUM  TROPONIN I (HIGH SENSITIVITY)    EKG EKG Interpretation  Date/Time:  Sunday August 10 2021 11:43:52 EDT Ventricular Rate:  75 PR Interval:  171 QRS Duration: 102 QT Interval:  366 QTC Calculation: 409 R Axis:   15 Text Interpretation: Sinus rhythm Probable left atrial enlargement Borderline T wave abnormalities Baseline wander in lead(s) V2 No significant change since last tracing Confirmed by Isla Pence [phone]) on 08/10/2021 12:03:43 PM  Radiology DG Chest Port 1 View  Result Date: 08/10/2021 CLINICAL DATA:  Hypertension. EXAM: PORTABLE CHEST 1 VIEW COMPARISON:  05/27/2022 FINDINGS: Blunting of the costophrenic angles with bibasilar airspace opacities favoring moderate bilateral pleural effusions and passive atelectasis. Stable enlargement of the cardiopericardial silhouette noted. Indistinct pulmonary vasculature favoring pulmonary venous hypertension. No overt pulmonary edema. Atherosclerotic calcification of the aortic arch. IMPRESSION: 1. Moderate enlargement of the cardiopericardial silhouette with pulmonary venous hypertension but no overt edema. 2. Moderate bilateral pleural effusions with passive atelectasis. 3.  Aortic Atherosclerosis (  ICD10-I70.0). Electronically Signed   By: Van Clines M.D.   On: 08/10/2021 12:14    Procedures Procedures    Medications Ordered in ED Medications  0.9 %   sodium chloride infusion (1,000 mLs Intravenous New Bag/Given 08/10/21 1206)    ED Course/ Medical Decision Making/ A&P                           Medical Decision Making Amount and/or Complexity of Data Reviewed Labs: ordered. Radiology: ordered.  Risk Prescription drug management.   This patient presents to the ED for concern of dizziness, this involves an extensive number of treatment options, and is a complaint that carries with it a high risk of complications and morbidity.  The differential diagnosis includes orthostasis, anemia, electrolyte abn, cad, infection   Co morbidities that complicate the patient evaluation  ESRD on HD MWF, liver cirrhosis, DM2, hypokalemia, hypomagnesemia   Additional history obtained:  Additional history obtained from epic chart review External records from outside source obtained and reviewed including EMS report and wife   Lab Tests:  I Ordered, and personally interpreted labs.  The pertinent results include:  cbc with hgb of 9.3.  hgb 8.7 on 3/9.  Albumin is 3 which is improved.  Bmp with cr of 3 which is chronic and na of 132 which is improved.  Ammonia and Mg are nl   Imaging Studies ordered:  I ordered imaging studies including CXR  I independently visualized and interpreted imaging which showed    IMPRESSION:  1. Moderate enlargement of the cardiopericardial silhouette with  pulmonary venous hypertension but no overt edema.  2. Moderate bilateral pleural effusions with passive atelectasis.  3.  Aortic Atherosclerosis (ICD10-I70.0).   I agree with the radiologist interpretation   Cardiac Monitoring:  The patient was maintained on a cardiac monitor.  I personally viewed and interpreted the cardiac monitored which showed an underlying rhythm of: nsr   Medicines ordered and prescription drug management:  I ordered medication including IVFs  for symptomatic orthostasis  Reevaluation of the patient after these medicines showed  that the patient improved I have reviewed the patients home medicines and have made adjustments as needed   Problem List / ED Course:  Hypertension:  BP is coming down after taking home bp meds.  No indication for hypertensive emergency.. Orthostasis:  pt feels better after IVFs.  ? Over diuresed on Friday. ESRD on HD:  dialysis due tomorrow.  No indication of emergent dialysis. Electrolyte abn:  improving from what they were during recent hospital stay   Reevaluation:  After the interventions noted above, I reevaluated the patient and found that they have :improved   Social Determinants of Health:  Lives at home with wife   Dispostion:  After consideration of the diagnostic results and the patients response to treatment, I feel that the patent would benefit from discharge with outpatient f/u.          Final Clinical Impression(s) / ED Diagnoses Final diagnoses:  Hypertension, unspecified type  ESRD on hemodialysis ([place])  Chronic anemia    Rx / DC Orders ED Discharge Orders     None         Isla Pence, MD 08/10/21 1437

## 2021-08-13 ENCOUNTER — Telehealth (HOSPITAL_COMMUNITY): Payer: Self-pay | Admitting: *Deleted

## 2021-08-13 ENCOUNTER — Telehealth: Payer: Self-pay

## 2021-08-13 NOTE — Telephone Encounter (Signed)
Melisa from [facility] was made aware.  ?

## 2021-08-13 NOTE — Telephone Encounter (Signed)
Melisa from [facility] called stating that the pt was recently taken off of his lactulose during a recent hospitalization due to cdiff. Cdiff has resolved and the pt's ammonia levels are now normal. Family is wanting to know when the pt can start back on the lactulose.  ?

## 2021-08-13 NOTE — Telephone Encounter (Signed)
Yes, resume lactulose 45 ml twice daily with a goal of 3-4 Bms daily.  ?

## 2021-08-13 NOTE — Telephone Encounter (Signed)
Received a call from [facility] from [place] stating that wife was concerned regarding troponin that was done recently in the ER and requested a re draw before next visit.  Troponin was only mildly elevated and advised that it would not be reasonable to recheck.  Jeffrey [name] will notify wife. ?

## 2021-08-14 ENCOUNTER — Encounter (HOSPITAL_COMMUNITY): Payer: Self-pay

## 2021-08-14 ENCOUNTER — Ambulatory Visit (HOSPITAL_COMMUNITY)
Admission: RE | Admit: 2021-08-14 | Discharge: 2021-08-14 | Disposition: A | Discharge status: Home / Self Care | Payer: Medicare Other | Source: Ambulatory Visit | Attending: Gastroenterology | Admitting: Gastroenterology

## 2021-08-14 ENCOUNTER — Other Ambulatory Visit: Payer: Self-pay

## 2021-08-14 DIAGNOSIS — R188 Other ascites: Secondary | ICD-10-CM | POA: Diagnosis not present

## 2021-08-14 LAB — GRAM STAIN: Gram Stain: NONE SEEN

## 2021-08-14 LAB — BODY FLUID CELL COUNT WITH DIFFERENTIAL
Eos, Fluid: 0 %
Lymphs, Fluid: 77 %
Monocyte-Macrophage-Serous Fluid: 20 % — ABNORMAL LOW (ref 50–90)
Neutrophil Count, Fluid: 3 % (ref 0–25)
Total Nucleated Cell Count, Fluid: 133 cu mm (ref 0–1000)

## 2021-08-14 NOTE — Progress Notes (Signed)
PT tolerated left sided paracentesis procedure well today and 2.5 Liters of cloudy light brown colored fluid removed with labs collected and sent for processing. PT verbalized understanding of discharge instructions and pushed to waiting area to wife at this time with no acute distress noted.  ?

## 2021-08-14 NOTE — Procedures (Signed)
?  US guided LLQ paracentesis ? ?2.5 L cloudy light brown fluid ?Labs sent per MD ?Tolerated well ? ?EBL: none ?

## 2021-08-15 LAB — PATHOLOGIST SMEAR REVIEW

## 2021-08-19 ENCOUNTER — Telehealth (INDEPENDENT_AMBULATORY_CARE_PROVIDER_SITE_OTHER): Payer: Medicare Other | Admitting: Gastroenterology

## 2021-08-19 ENCOUNTER — Telehealth: Payer: Self-pay | Admitting: *Deleted

## 2021-08-19 ENCOUNTER — Other Ambulatory Visit: Payer: Self-pay

## 2021-08-19 VITALS — Ht 66.0 in | Wt 181.4 lb

## 2021-08-19 DIAGNOSIS — K7030 Alcoholic cirrhosis of liver without ascites: Secondary | ICD-10-CM | POA: Diagnosis not present

## 2021-08-19 LAB — CULTURE, BODY FLUID W GRAM STAIN -BOTTLE
Culture: NO GROWTH
Special Requests: ADEQUATE

## 2021-08-19 NOTE — Patient Instructions (Signed)
We will arrange an ultrasound in June 2023.  ? ?You will come back for follow-up in 3 months! ? ?I enjoyed seeing you again today! As you know, I value our relationship and want to provide genuine, compassionate, and quality care. I welcome your feedback. If you receive a survey regarding your visit,  I greatly appreciate you taking time to fill this out. See you next time! ? ?Annitta Needs, PhD, ANP-BC ?[facility]  ? ?

## 2021-08-19 NOTE — Progress Notes (Signed)
? ? ? ? ?Primary Care Physician:  Monico Blitz, MD  ?Primary GI: Dr. Gala Romney ? ?Patient Location: Home ?  ?Provider Location: [place] office ?  ?Reason for Visit: Follow-up ?  ?Persons present on the virtual encounter, with roles: Patient and wife ?  ?Total time (minutes) spent on medical discussion: 15 minutes ?  ?Due to COVID-19, visit was conducted using virtual method.  Visit was requested by patient. ? ?Virtual Visit via MyChart Video Note ?Due to COVID-19, visit is conducted virtually and was requested by patient.  ? ?I connected with Jeffrey [name] on 08/19/21 at 11:30 AM EDT by video and verified that I am speaking with the correct person using two identifiers. ?  ?I discussed the limitations, risks, security and privacy concerns of performing an evaluation and management service by telephone and the availability of in person appointments. I also discussed with the patient that there may be a patient responsible charge related to this service. The patient expressed understanding and agreed to proceed. ? ?Chief Complaint  ?Patient presents with  ? Follow-up  ? ? ? 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 EGD Dec 2022 with stigmata of prior esophageal banding and no significant varices seen. Portal gastropathy. Polypoid gastric and duodenal mucosa with overlying erosions, marked mucosal friability.  ? ? ?Recently inpatient with Cdiff colitis Feb 2/28-3/1. SBP prophylaxis with Levaquin discontinued.  ? ? ?Stool is black on iron. Stool is twice a day since doing fiber. Soft pudding.  Taking Xifaxan. Taking iron.  ? ?80 mg lasix and 50 mg spironolactone.  ? ?Palliative care calls once in awhile. Intermittent confusion. Not confused today.  ? ?Para recently 3/16 and feels improved.  ? ?Left foot hurts. No significant swelling in leg.  ? ?Past Medical History:  ?Diagnosis Date  ? A-fib ([place])   ? when initially stating dialysis january 2021 at [facility] per spouse; never heard anything else about it  ? Alcoholic cirrhosis ([place])   ? patient reports completing hep a and b vaccines in 2006  ? Anemia   ? has had 3 units of prbcs 2013  ? Anxiety   ? Arthritis   ? Asymptomatic gallstones   ? Ultrasound in 2006  ? B12 deficiency 11/21/2020  ? C. difficile colitis   ? Cholelithiasis   ? Chronic kidney disease   ? Dialysis M/W/F/Sa  ? Depression   ? Diabetes ([place])   ? Duodenal ulcer with hemorrhage   ? per patient in 2006 or 2007, records have been requested  ? Dyspnea   ? low oxygen level -  has O2 2 L all day  ? 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 see PSH  ? GERD (gastroesophageal reflux disease)   ? Heart burn   ? History of alcohol abuse   ? quit 05/2013  ? HOH (hard of hearing)   ? Hypertension   ? Liver cirrhosis ([place])   ? ETOH  ? SBP (spontaneous bacterial peritonitis) ([place])   ? 2020, November 2021.   ? Splenomegaly   ? Ultrasound in 2006  ? ? ? ?Past Surgical History:  ?Procedure Laterality Date  ? AGILE CAPSULE N/A 09/20/2019  ? Procedure: AGILE CAPSULE;  Surgeon: Daneil Dolin, MD;  [facility];  Service: Endoscopy;  Laterality: N/A;  ? APPENDECTOMY    ? AV FISTULA PLACEMENT Left 08/17/2019  ? Procedure: ARTERIOVENOUS (AV) FISTULA CREATION VERSES ARTERIOVENOUS GRAFT;  Surgeon: [name], MD;  Location: [place];  Service: Vascular;  Laterality: Left;  ? BASCILIC VEIN TRANSPOSITION Left 09/28/2019  ? Procedure: LEFT SECOND STAGE BASCILIC VEIN TRANSPOSITION;  Surgeon: [name], MD;  Location: [place];  Service: Vascular;  Laterality: Left;  ? BIOPSY  02/03/2018  ? Procedure:  BIOPSY;  Surgeon: Daneil Dolin, MD;  [facility];  Service: Endoscopy;;  bx of gastric polyps  ? BIOPSY  09/19/2019  ? Procedure: BIOPSY;  Surgeon: Danie Binder, MD;  [facility];  Service: Endoscopy;;  ? BIOPSY  01/16/2021  ? Procedure: BIOPSY;  Surgeon: Daneil Dolin, MD;  [facility];  Service: Endoscopy;;  ? CATARACT EXTRACTION Right   ? COLONOSCOPY  03/10/2005  ? Rectal polyp as described above, removed with snare. Left sided  diverticula. The remainder of the colonic mucosa appeared normal. Inflammed polyp on path.  ? COLONOSCOPY  04/1999  ? Dr. Thea Silversmith polyps removed,  Path showed hyperplastic  ? COLONOSCOPY  10/2015  ? Dr. Britta Mccreedy: diverticulosis, single sessile tubular adenoma 3-84m in size removed from descending colon.   ? COLONOSCOPY WITH ESOPHAGOGASTRODUODENOSCOPY (EGD)  03/31/2012  ? RKCL:EXNTZGYAVMs. Colonic diverticulosis. tubular adenoma colon  ? COLONOSCOPY WITH ESOPHAGOGASTRODUODENOSCOPY (EGD)  06/2019  ? FORSYTH: small esophageal varices without high risk stigmata, single large AVM on lesser curvature in gastric body s/p APC ablation, gastric antral and duodenal bulb polyposis. No significant source to explain transfusion dependent anemia. Colonoscopy with portal colopathy and diffuse edema, changes of Cdiff colitis on colonoscopy  ? COLONOSCOPY WITH PROPOFOL N/A 01/19/2021  ? Surgeon: CDaryel November MD; 15 mm polyp in descending colon removed piecemeal, 6 mm polyp in ascending colon and 12 mm polyp in sigmoid colon not removed due to high bleeding risk, benign polypoid lesion at splenic flexure, diverticulosis in sigmoid and descending colon, rectal varices.  ? COLONOSCOPY WITH PROPOFOL N/A 01/16/2021  ? Surgeon: RDaneil Dolin MD;  Portal colopathy, large rectal varices, polypoid mass at splenic flexure biopsy, two 1 cm left colon polyps not removed, left-sided diverticulosis.  Reported patient had bleeding from splenic flexure lesion.   Pathology with acutely inflamed colonic mucosa with localized ulceration and prominent reactive changes.  ? ESOPHAGEAL BANDING N/A 02/03/2018  ? Procedure: ESOPHAGEAL BANDING;  Surgeon: RDaneil Dolin MD;  [facility];  Service: Endoscopy;  Laterality: N/A;  ? ESOPHAGEAL BANDING  01/16/2021  ? Procedure: ESOPHAGEAL BANDING;  Surgeon: RDaneil Dolin MD;  [facility];  Service: Endoscopy;;  ? ESOPHAGOGASTRODUODENOSCOPY  01/15/2005  ? Three columns grade 1 to 2 esophageal varices, otherwise normal esophageal mucosa.  Esophagus was not manipulated otherwise./Nodularity of the antrum with overlying erosions, nonspecific finding. Path showed rare H.pylori  ? ESOPHAGOGASTRODUODENOSCOPY  09/2008  ? Dr. RGaylene Brookscolumns of grade 2-3 esoph varices, only one column was prominent. Portal gastropathy, multiple gastrc polyps at antrum, two were 2cm with black eschar, bulbar polyps, bulbar erosions  ? ESOPHAGOGASTRODUODENOSCOPY  11/2004  ? Dr. FBrantley Stage3 esoph varices  ? ESOPHAGOGASTRODUODENOSCOPY  03/31/2012  ? RMR: 4 columns(3-GR 2, 1-GR1) non-bleeding esophageal varices, portal gastropathy, small HH, early GAVE, multiple gastric polyps   ? ESOPHAGOGASTRODUODENOSCOPY (EGD) WITH PROPOFOL N/A 02/03/2018  ? Dr.  Rourk: Esophageal varices, 3 columns grade 1-2.  Portal hypertensive gastropathy.  Multiple gastric polyps, biopsy consistent with hyperplastic.  ? ESOPHAGOGASTRODUODENOSCOPY (EGD) WITH PROPOFOL N/A 09/19/2019  ? Fields: grade I and II esophageal varcies, mild portal hypertensive gastropathy, moderate gastritis but no H. pylori, single hyperplastic gastric polyp removed, obvious source for melena/transfusion dependent anemia not identified, may be due to friable gastric and duodenal mucosa in the setting of portal hypertension  ? ESOPHAGOGASTRODUODENOSCOPY (EGD) WITH PROPOFOL N/A 01/16/2021  ? Surgeon: Daneil Dolin, MD; Portal hypertensive gastropathy, grade 2 esophageal varices  with bleeding stigmata s/p banding x4, slight nodularity and congestion involving bulb and second portion of duodenum, nonspecific.  Recommended repeat EGD in 4 weeks.  ? ESOPHAGOGASTRODUODENOSCOPY (EGD) WITH

## 2021-08-19 NOTE — Telephone Encounter (Signed)
Jeffrey [name], you are scheduled for a virtual visit with your provider today.  Just as we do with appointments in the office, we must obtain your consent to participate.  Your consent will be active for this visit and any virtual visit you may have with one of our providers in the next 365 days.  If you have a MyChart account, I can also send a copy of this consent to you electronically.  All virtual visits are billed to your insurance company just like a traditional visit in the office.  As this is a virtual visit, video technology does not allow for your provider to perform a traditional examination.  This may limit your provider's ability to fully assess your condition.  If your provider identifies any concerns that need to be evaluated in person or the need to arrange testing such as labs, EKG, etc, we will make arrangements to do so.  Although advances in technology are sophisticated, we cannot ensure that it will always work on either your end or our end.  If the connection with a video visit is poor, we may have to switch to a telephone visit.  With either a video or telephone visit, we are not always able to ensure that we have a secure connection.   I need to obtain your verbal consent now.   Are you willing to proceed with your visit today?  ?

## 2021-08-19 NOTE — Telephone Encounter (Signed)
Pt consented to a virtual visit. 

## 2021-08-21 ENCOUNTER — Emergency Department (HOSPITAL_COMMUNITY): Payer: Medicare Other

## 2021-08-21 ENCOUNTER — Emergency Department (HOSPITAL_COMMUNITY)
Admission: EM | Admit: 2021-08-21 | Discharge: 2021-08-21 | Disposition: A | Discharge status: Home / Self Care | Payer: Medicare Other | Attending: Emergency Medicine | Admitting: Emergency Medicine

## 2021-08-21 ENCOUNTER — Inpatient Hospital Stay (HOSPITAL_COMMUNITY): Payer: Medicare Other

## 2021-08-21 ENCOUNTER — Other Ambulatory Visit: Payer: Self-pay

## 2021-08-21 ENCOUNTER — Encounter (HOSPITAL_COMMUNITY): Payer: Self-pay

## 2021-08-21 VITALS — BP 180/81 | Temp 97.7°F | Resp 18

## 2021-08-21 DIAGNOSIS — D500 Iron deficiency anemia secondary to blood loss (chronic): Secondary | ICD-10-CM

## 2021-08-21 DIAGNOSIS — M7989 Other specified soft tissue disorders: Secondary | ICD-10-CM | POA: Diagnosis present

## 2021-08-21 DIAGNOSIS — D649 Anemia, unspecified: Secondary | ICD-10-CM | POA: Insufficient documentation

## 2021-08-21 DIAGNOSIS — L03116 Cellulitis of left lower limb: Secondary | ICD-10-CM | POA: Insufficient documentation

## 2021-08-21 DIAGNOSIS — Z79899 Other long term (current) drug therapy: Secondary | ICD-10-CM | POA: Diagnosis not present

## 2021-08-21 DIAGNOSIS — N186 End stage renal disease: Secondary | ICD-10-CM | POA: Diagnosis not present

## 2021-08-21 DIAGNOSIS — Z992 Dependence on renal dialysis: Secondary | ICD-10-CM | POA: Diagnosis not present

## 2021-08-21 LAB — CBC WITH DIFFERENTIAL/PLATELET
Abs Immature Granulocytes: 0.01 10*3/uL (ref 0.00–0.07)
Basophils Absolute: 0 10*3/uL (ref 0.0–0.1)
Basophils Relative: 1 %
Eosinophils Absolute: 0.2 10*3/uL (ref 0.0–0.5)
Eosinophils Relative: 4 %
HCT: 22.6 % — ABNORMAL LOW (ref 39.0–52.0)
Hemoglobin: 6.6 g/dL — CL (ref 13.0–17.0)
Immature Granulocytes: 0 %
Lymphocytes Relative: 10 %
Lymphs Abs: 0.4 10*3/uL — ABNORMAL LOW (ref 0.7–4.0)
MCH: 32.5 pg (ref 26.0–34.0)
MCHC: 29.2 g/dL — ABNORMAL LOW (ref 30.0–36.0)
MCV: 111.3 fL — ABNORMAL HIGH (ref 80.0–100.0)
Monocytes Absolute: 0.4 10*3/uL (ref 0.1–1.0)
Monocytes Relative: 11 %
Neutro Abs: 2.9 10*3/uL (ref 1.7–7.7)
Neutrophils Relative %: 74 %
Platelets: 130 10*3/uL — ABNORMAL LOW (ref 150–400)
RBC: 2.03 MIL/uL — ABNORMAL LOW (ref 4.22–5.81)
RDW: 23.7 % — ABNORMAL HIGH (ref 11.5–15.5)
WBC: 3.9 10*3/uL — ABNORMAL LOW (ref 4.0–10.5)
nRBC: 0 % (ref 0.0–0.2)

## 2021-08-21 LAB — PREPARE RBC (CROSSMATCH)

## 2021-08-21 MED ORDER — ACETAMINOPHEN 325 MG PO TABS
650.0000 mg | ORAL_TABLET | Freq: Once | ORAL | Status: AC
  Administered 2021-08-21: 650 mg via ORAL
  Filled 2021-08-21: qty 2

## 2021-08-21 MED ORDER — DIPHENHYDRAMINE HCL 25 MG PO CAPS
25.0000 mg | ORAL_CAPSULE | Freq: Once | ORAL | Status: AC
  Administered 2021-08-21: 25 mg via ORAL
  Filled 2021-08-21: qty 1

## 2021-08-21 MED ORDER — CEPHALEXIN 500 MG PO CAPS: 500.0000 mg | ORAL_CAPSULE | Freq: Two times a day (BID) | ORAL | 0 refills | Status: AC

## 2021-08-21 MED ORDER — SODIUM CHLORIDE 0.9% IV SOLUTION
250.0000 mL | Freq: Once | INTRAVENOUS | Status: AC
  Administered 2021-08-21: 250 mL via INTRAVENOUS

## 2021-08-21 MED ORDER — SODIUM CHLORIDE 0.9% FLUSH: 10.0000 mL | INTRAVENOUS | Status: DC | PRN

## 2021-08-21 MED ORDER — SODIUM CHLORIDE 0.9 % IV SOLN
1.0000 g | Freq: Once | INTRAVENOUS | Status: AC
  Administered 2021-08-21: 1 g via INTRAVENOUS
  Filled 2021-08-21: qty 10

## 2021-08-21 NOTE — Progress Notes (Signed)
Patient to be transported to ED for possible LLE cellulitis per Tarri Abernethy, PA. Primary RN and Agricultural consultant made aware. Report given the Remo Lipps, ED Charge RN. Patient transported to ED by NT. ?

## 2021-08-21 NOTE — Progress Notes (Signed)
Patient seen at bedside per RN concern for left leg erythema and edema. ?Patient reports injury to left great toe approximately 10 days ago, followed by progressive erythema, edema, and leg pain.  He denies any associated fever and chills. ? ?There is a small pustule on the tip of his left great toe.  There is 2+ pitting pretibial edema with bright red erythema and warmth extending from his foot to just below his right knee.  ? ?Given his multiple chronic comorbidities (ESRD, cirrhosis, chronic GI bleeding, diabetes), I believe that he needs more thorough medical evaluation than what can be provided expediently here in the cancer center.  We will send him down to the ED for further evaluation. ? ?I personally spoke with Dr. Vanita Panda, ED physician, to provide report.  He agrees to evaluate patient and treat with any necessary antibiotics.  He does request that we complete patient's blood transfusions at the cancer center, once DVT, sepsis, and more serious etiologies of symptoms have been ruled out. ? ?We will send patient down to ED, and will plan to complete his blood transfusions later this afternoon. ? ?Harriett Rush, PA-C  ?08/21/21 11:00 AM  ?

## 2021-08-21 NOTE — Progress Notes (Signed)
Pt presents today for possible blood transfusion per provider's order. Pt c/o pain, redness, and swelling in LLE. R.Pennington-PA  made aware provider came to bedside to access patient. Provider stated to send pt to ER for further evaluation. Call was placed to [facility] ED charge nurse to let him know pt's blood is ready. Charge nurse verbalized understanding. ?

## 2021-08-21 NOTE — ED Notes (Signed)
Horris Latino from cancer center called to ask if they can give blood in ED. Nurse asked Dr. Vanita Panda and he said it was ok. [place] nurse bringing 2 meds to give pt prior blood transfusion they will do in 30 minutes.  ?

## 2021-08-21 NOTE — ED Provider Notes (Signed)
?Jeffrey [name] ?Provider Note ? ? ?CSN: [id number] ?Arrival date & time: 08/21/21  1130 ? ?  ? ?History ? ?Chief Complaint  ?Patient presents with  ? Leg Swelling  ?  Blood transfusion  ? ? ?Jeffrey [name] is a 76 y.o. male. ? ?HPI ?Patient presents with his wife who assists with history.  Additional details obtained on chart review.  I actually spoke with the patient's transfusion center prior to his arrival as well. ? ?Presents with concern of 1 week of left lower extremity erythema, swelling.  He states that aside from this he is otherwise generally well, denies new complaints, new pain, dyspnea, discomfort.  He has multiple medical issues including end-stage renal disease, dialysis scheduled for tomorrow.  He also has anemia of chronic disease, and was scheduled for transfusion today after his last blood counts demonstrate hemoglobin less than 7.  He notes that over the past week in spite of the redness of his leg he has not had medical evaluation.  Today, after he went to the transfusion center he was sent here for evaluation due to concern for cellulitis versus occlusion.  He denies dyspnea, chest pain, as above. ? ?  ? ?Home Medications ?Prior to Admission medications   ?Medication Sig Start Date End Date Taking? Authorizing Provider  ?acetaminophen (TYLENOL) 650 MG CR tablet Take 650 mg by mouth every 8 (eight) hours as needed for pain.   Yes [provider]  ?Alcohol Swabs (ALCOHOL PREP) 70 % PADS Apply 1 each topically 3 (three) times daily. 01/31/20  Yes [provider]  ?ALPRAZolam Duanne Moron) 0.5 MG tablet Take 0.5 mg by mouth See admin instructions. Take one tablet (0.5 mg) by mouth on Monday, Wednesday, Friday before dialysis, may also take one tablet (0.5 mg) daily as needed for anxiety. 09/21/20  Yes [provider]  ?Ascorbic Acid (VITAMIN C) 1000 MG tablet Take 1,000 mg by mouth in the morning.   Yes [provider]  ?BD PEN NEEDLE MICRO U/F 32G X 6 MM  MISC SMARTSIG:SUB-Q Daily PRN 04/23/21  Yes [provider]  ?calcium carbonate (TUMS - DOSED IN MG ELEMENTAL CALCIUM) 500 MG chewable tablet Chew 1 tablet by mouth 3 (three) times daily with meals.   Yes [provider]  ?Calcium Carbonate-Simethicone (ALKA-SELTZER HEARTBURN + GAS) 750-80 MG CHEW Chew 1-2 tablets by mouth 2 (two) times daily as needed (acid reflux).   Yes [provider]  ?cetirizine (ZYRTEC) 10 MG tablet Take 10 mg by mouth daily. 06/24/21  Yes [provider]  ?cholecalciferol (VITAMIN D3) 25 MCG (1000 UNIT) tablet Take 1,000 Units by mouth daily.   Yes [provider]  ?cloNIDine (CATAPRES) 0.1 MG tablet Take 0.1 mg by mouth 2 (two) times daily. Take if blood pressure will not go down   Yes [provider]  ?diltiazem (TIAZAC) 120 MG 24 hr capsule Take 120 mg by mouth daily. 05/28/20  Yes [provider]  ?famotidine (PEPCID) 20 MG tablet Take 1 tablet (20 mg total) by mouth 2 (two) times daily. 07/30/21 07/30/22 Yes Barton Dubois, MD  ?furosemide (LASIX) 40 MG tablet Take 80 mg by mouth daily. 12/29/19  Yes [provider]  ?gabapentin (NEURONTIN) 100 MG capsule TAKE 1 CAPSULE BY MOUTH AT BEDTIME ?Patient taking differently: Take 100 mg by mouth 3 (three) times daily. 05/28/20  Yes Waynetta Sandy, MD  ?Garlic 4403 MG CAPS Take 1,000 mg by mouth daily.   Yes [provider]  ?  GLUCAGEN HYPOKIT 1 MG SOLR injection Inject 1 mg into the muscle once as needed for low blood sugar. 11/28/20  Yes [provider]  ?HUMALOG KWIKPEN 100 UNIT/ML KwikPen 10-50 Units 3 (three) times daily. Sliding scale 04/23/21  Yes [provider]  ?hydrOXYzine (ATARAX/VISTARIL) 25 MG tablet Take 25 mg by mouth 3 (three) times daily. For itching 11/23/20  Yes [provider]  ?iron polysaccharides (NIFEREX) 150 MG capsule Take 150 mg by mouth daily with supper.   Yes [provider]   ?lidocaine-prilocaine (EMLA) cream Apply 2 application topically daily. 06/11/21  Yes [provider]  ?metoprolol succinate (TOPROL-XL) 25 MG 24 hr tablet Take 1 tablet (25 mg total) by mouth See admin instructions. Take one tablet (25 mg) by mouth at bedtime every night and take one tablet (25 mg) in morning on  Sun, Tues, Thurs and Sat (non-dialysis days) 04/15/21  Yes Johnson, Clanford L, MD  ?Multiple Vitamin (MULTIVITAMIN WITH MINERALS) TABS tablet Take 1 tablet by mouth in the morning.    Yes [provider]  ?omega-3 acid ethyl esters (LOVAZA) 1 g capsule Take 2 g by mouth 2 (two) times daily.  04/07/19  Yes [provider]  ?ondansetron (ZOFRAN) 4 MG tablet Take 4 mg by mouth every 8 (eight) hours as needed for nausea or vomiting.   Yes [provider]  ?oxyCODONE-acetaminophen (PERCOCET/ROXICET) 5-325 MG tablet Take 1 tablet by mouth every 4 (four) hours as needed for moderate pain. 02/27/20  Yes [provider]  ?Ped Vitamins ACD Fl-Iron (TRI-VIT/FLUORIDE/IRON PO) Take 1 tablet by mouth daily. 08/01/19  Yes [provider]  ?pravastatin (PRAVACHOL) 20 MG tablet Take 20 mg by mouth in the morning.    Yes [provider]  ?Probiotic Product (PROBIOTIC DAILY PO) Take 1 tablet by mouth in the morning.    Yes [provider]  ?spironolactone (ALDACTONE) 50 MG tablet Take 50 mg by mouth in the morning.    Yes [provider]  ?thiamine (VITAMIN B-1) 100 MG tablet Take 100 mg by mouth daily. 08/27/20  Yes [provider]  ?tiZANidine (ZANAFLEX) 2 MG tablet Take 2 mg by mouth 2 (two) times daily. 08/20/21  Yes [provider]  ?TRESIBA FLEXTOUCH 200 UNIT/ML FlexTouch Pen Inject 120 Units into the skin at bedtime. 04/22/21  Yes [provider]  ?triamcinolone cream (KENALOG) 0.1 % Apply 1 application topically 2 (two) times daily as needed (dry skin Itching). 12/30/17  Yes [provider]  ?venlafaxine  (EFFEXOR) 75 MG tablet Take 75 mg by mouth 3 (three) times daily with meals.    Yes [provider]  ?XIFAXAN 550 MG TABS tablet TAKE 1 TABLET BY MOUTH TWICE DAILY ?Patient taking differently: Take 550 mg by mouth 2 (two) times daily. 02/13/21  Yes Mahala Menghini, PA-C  ?midodrine (PROAMATINE) 10 MG tablet Take by mouth. ?Patient not taking: Reported on 08/21/2021 08/20/21   [provider]  ?mirtazapine (REMERON) 30 MG tablet Take 1 tablet (30 mg total) by mouth at bedtime. ?Patient taking differently: Take 30 mg by mouth daily with supper. 02/10/20   Roxan Hockey, MD  ?pantoprazole (PROTONIX) 40 MG tablet Take 40 mg by mouth daily. ?Patient not taking: Reported on 08/21/2021 08/20/21   [provider]  ?   ? ?Allergies    ?Tape   ? ?Review of Systems   ?Review of Systems  ?Constitutional:   ?     Per HPI, otherwise negative  ?HENT:    ?  Per HPI, otherwise negative  ?Respiratory:    ?     Per HPI, otherwise negative  ?Cardiovascular:   ?     Per HPI, otherwise negative  ?Gastrointestinal:  Negative for vomiting.  ?Endocrine:  ?     Negative aside from HPI  ?Genitourinary:   ?     Neg aside from HPI   ?Musculoskeletal:   ?     Per HPI, otherwise negative  ?Skin:  Positive for color change.  ?Neurological:  Negative for syncope.  ? ?Physical Exam ?Updated Vital Signs ?BP (!) 183/76   Pulse 87   Temp 98 ?F (36.7 ?C) (Oral)   Resp (!) 25   Ht '5\' 6"'$  (1.676 m)   Wt 82.1 kg   SpO2 100%   BMI 29.21 kg/m?  ?Physical Exam ?Vitals and nursing note reviewed.  ?Constitutional:   ?   Appearance: He is well-developed. He is ill-appearing.  ?HENT:  ?   Head: Normocephalic and atraumatic.  ?Eyes:  ?   Conjunctiva/sclera: Conjunctivae normal.  ?Cardiovascular:  ?   Rate and Rhythm: Normal rate and regular rhythm.  ?Pulmonary:  ?   Effort: Pulmonary effort is normal. No respiratory distress.  ?   Breath sounds: No stridor.  ?Abdominal:  ?   General: There is no distension.  ?Musculoskeletal:  ?    Comments: Right lower extremity mid calf through the foot erythematous.  No open wounds on the foot itself, though there is 1 area of hardened skin on the distal edge of the great toe.  Pulses are palpable, DP, PT  ?Skin: ?   General

## 2021-08-21 NOTE — Discharge Instructions (Addendum)
As discussed, with your diagnosis of cellulitis in the context of end-stage renal disease, frequent transfusions it is important that you monitor your condition carefully.  Do not hesitate to return here for concerning changes in your condition. ?

## 2021-08-21 NOTE — ED Triage Notes (Signed)
Patient arrived from cancer center with complaints of left leg cellulitis. Patient was supposed to receive 2 units of blood at the cancer center and was sent to ED for evaluation with concerns for left leg. Patient did not receive blood transfusion.  ?

## 2021-08-21 NOTE — Progress Notes (Signed)
CRITICAL VALUE STICKER ? ?CRITICAL VALUE: Hgb 6.6 ? ?RECEIVER (on-site recipient of call): Charlyne Petrin, RN ? ?DATE & TIME NOTIFIED: 08/21/2021 ? ?MESSENGER (representative from lab):S. Vaillancourt ? ?MD NOTIFIED: Tarri Abernethy, PA ? ?TIME OF NOTIFICATION:08/21/2021 ? ?RESPONSE: Transfuse 2 units of blood today.  Primary RN notified.  Lab called.   ? ? ?Patient transported to ER with the ER charge nurse notified transfusions are ready.  [place] note with Rebekah Pennington,PA and instructed if the patient arrives back to the CC later than 1330 to transfuse one unit of blood and the patient can return next week for recheck and more blood if needed.   ? ?1345-Ok to transfuse one unit of blood today and reschedule the patient to return next week for labs and blood bank sample verbal order Tarri Abernethy, PA.  Scheduling notified.  ? ?1245- one unit of blood started in the Emergency Room.  Alinda Sierras, RN ok with completing the unit and discharging the patient from the ER.   ? ?1415-Report given to M. Mancel Bale, RN.  Patient resting with no complaints voiced.  Peripheral IV site intact.  No s/s of distress noted.   Family at side.   ?

## 2021-08-25 LAB — BPAM RBC
Blood Product Expiration Date: 202303312359
Blood Product Expiration Date: 202304242359
ISSUE DATE / TIME: 202303231355
Unit Type and Rh: 5100
Unit Type and Rh: 9500

## 2021-08-25 LAB — TYPE AND SCREEN
ABO/RH(D): O POS
Antibody Screen: NEGATIVE
Unit division: 0
Unit division: 0

## 2021-08-26 ENCOUNTER — Inpatient Hospital Stay (HOSPITAL_COMMUNITY): Payer: Medicare Other

## 2021-08-26 ENCOUNTER — Other Ambulatory Visit: Payer: Self-pay

## 2021-08-26 ENCOUNTER — Encounter (HOSPITAL_COMMUNITY): Payer: Self-pay | Admitting: Hematology

## 2021-08-26 DIAGNOSIS — D649 Anemia, unspecified: Secondary | ICD-10-CM

## 2021-08-26 DIAGNOSIS — E538 Deficiency of other specified B group vitamins: Secondary | ICD-10-CM | POA: Diagnosis not present

## 2021-08-26 DIAGNOSIS — D500 Iron deficiency anemia secondary to blood loss (chronic): Secondary | ICD-10-CM

## 2021-08-26 LAB — CBC WITH DIFFERENTIAL/PLATELET
Abs Immature Granulocytes: 0.01 10*3/uL (ref 0.00–0.07)
Basophils Absolute: 0.1 10*3/uL (ref 0.0–0.1)
Basophils Relative: 1 %
Eosinophils Absolute: 0.2 10*3/uL (ref 0.0–0.5)
Eosinophils Relative: 4 %
HCT: 26.1 % — ABNORMAL LOW (ref 39.0–52.0)
Hemoglobin: 7.9 g/dL — ABNORMAL LOW (ref 13.0–17.0)
Immature Granulocytes: 0 %
Lymphocytes Relative: 8 %
Lymphs Abs: 0.4 10*3/uL — ABNORMAL LOW (ref 0.7–4.0)
MCH: 32.5 pg (ref 26.0–34.0)
MCHC: 30.3 g/dL (ref 30.0–36.0)
MCV: 107.4 fL — ABNORMAL HIGH (ref 80.0–100.0)
Monocytes Absolute: 0.4 10*3/uL (ref 0.1–1.0)
Monocytes Relative: 8 %
Neutro Abs: 4 10*3/uL (ref 1.7–7.7)
Neutrophils Relative %: 79 %
Platelets: 167 10*3/uL (ref 150–400)
RBC: 2.43 MIL/uL — ABNORMAL LOW (ref 4.22–5.81)
RDW: 21.2 % — ABNORMAL HIGH (ref 11.5–15.5)
WBC: 5 10*3/uL (ref 4.0–10.5)
nRBC: 0 % (ref 0.0–0.2)

## 2021-08-26 LAB — SAMPLE TO BLOOD BANK

## 2021-08-26 NOTE — Progress Notes (Signed)
Patient presents today for possible blood transfusion per providers order.  Hgb noted to be 7.9, patient asymptomatic.  MD notified. ? ?No blood today, patient advised to call if he starts feeling bad before his next appointment.  Patient expressed understanding. ? ?No complaints at this time.  Discharge from clinic via wheelchair in stable condition.  Alert and oriented X 3.  Follow up with [facility] as scheduled.  ?

## 2021-08-26 NOTE — Progress Notes (Signed)
Patient's blood was completed in the Emergency Room and according to documentation on the blood product identification tag the blood was completed at 1526.  That information was added today to the blood product administration flowsheet so we could complete our encounter.   ?

## 2021-08-26 NOTE — Patient Instructions (Signed)
[facility]  Discharge Instructions: ?Thank you for choosing [facility] to provide your oncology and hematology care.  ?If you have a lab appointment with the [facility], please come in thru the Main Entrance and check in at the main information desk. ? ?Wear comfortable clothing and clothing appropriate for easy access to any Portacath or PICC line.  ? ?We strive to give you quality time with your provider. You may need to reschedule your appointment if you arrive late (15 or more minutes).  Arriving late affects you and other patients whose appointments are after yours.  Also, if you miss three or more appointments without notifying the office, you may be dismissed from the clinic at the provider?s discretion.    ?  ?For prescription refill requests, have your pharmacy contact our office and allow 72 hours for refills to be completed.   ? ?Today you received the following chemotherapy and/or immunotherapy agents no transfusion    ?  ?To help prevent nausea and vomiting after your treatment, we encourage you to take your nausea medication as directed. ? ?BELOW ARE SYMPTOMS THAT SHOULD BE REPORTED IMMEDIATELY: ?*FEVER GREATER THAN 100.4 F (38 ?C) OR HIGHER ?*CHILLS OR SWEATING ?*NAUSEA AND VOMITING THAT IS NOT CONTROLLED WITH YOUR NAUSEA MEDICATION ?*UNUSUAL SHORTNESS OF BREATH ?*UNUSUAL BRUISING OR BLEEDING ?*URINARY PROBLEMS (pain or burning when urinating, or frequent urination) ?*BOWEL PROBLEMS (unusual diarrhea, constipation, pain near the anus) ?TENDERNESS IN MOUTH AND THROAT WITH OR WITHOUT PRESENCE OF ULCERS (sore throat, sores in mouth, or a toothache) ?UNUSUAL RASH, SWELLING OR PAIN  ?UNUSUAL VAGINAL DISCHARGE OR ITCHING  ? ?Items with * indicate a potential emergency and should be followed up as soon as possible or go to the Emergency Department if any problems should occur. ? ?Please show the CHEMOTHERAPY ALERT CARD or IMMUNOTHERAPY ALERT CARD at check-in to the Emergency  Department and triage nurse. ? ?Should you have questions after your visit or need to cancel or reschedule your appointment, please contact [facility] [phone]  and follow the prompts.  Office hours are 8:00 a.m. to 4:30 p.m. Monday - Friday. Please note that voicemails left after 4:00 p.m. may not be returned until the following business day.  We are closed weekends and major holidays. You have access to a nurse at all times for urgent questions. Please call the main number to the clinic [phone] and follow the prompts. ? ?For any non-urgent questions, you may also contact your provider using MyChart. We now offer e-Visits for anyone 64 and older to request care online for non-urgent symptoms. For details visit [email or web address]. ?  ?Also download the MyChart app! Go to the app store, search "MyChart", open the app, select , and log in with your MyChart username and password. ? ?Due to Covid, a mask is required upon entering the hospital/clinic. If you do not have a mask, one will be given to you upon arrival. For doctor visits, patients may have 1 support person aged 16 or older with them. For treatment visits, patients cannot have anyone with them due to current Covid guidelines and our immunocompromised population.  ?

## 2021-08-28 ENCOUNTER — Other Ambulatory Visit (HOSPITAL_COMMUNITY): Payer: Medicare Other

## 2021-08-28 ENCOUNTER — Encounter: Payer: Self-pay | Admitting: Gastroenterology

## 2021-08-28 ENCOUNTER — Ambulatory Visit (HOSPITAL_COMMUNITY): Payer: Medicare Other

## 2021-09-04 ENCOUNTER — Inpatient Hospital Stay (HOSPITAL_COMMUNITY): Payer: Medicare Other

## 2021-09-04 ENCOUNTER — Inpatient Hospital Stay (HOSPITAL_COMMUNITY): Payer: Medicare Other | Attending: Hematology

## 2021-09-04 VITALS — BP 135/65 | HR 80 | Temp 98.2°F | Resp 18

## 2021-09-04 DIAGNOSIS — D631 Anemia in chronic kidney disease: Secondary | ICD-10-CM | POA: Insufficient documentation

## 2021-09-04 DIAGNOSIS — E538 Deficiency of other specified B group vitamins: Secondary | ICD-10-CM | POA: Diagnosis present

## 2021-09-04 DIAGNOSIS — D649 Anemia, unspecified: Secondary | ICD-10-CM

## 2021-09-04 DIAGNOSIS — N186 End stage renal disease: Secondary | ICD-10-CM | POA: Diagnosis not present

## 2021-09-04 DIAGNOSIS — D500 Iron deficiency anemia secondary to blood loss (chronic): Secondary | ICD-10-CM

## 2021-09-04 LAB — CBC WITH DIFFERENTIAL/PLATELET
Abs Immature Granulocytes: 0.02 10*3/uL (ref 0.00–0.07)
Basophils Absolute: 0.1 10*3/uL (ref 0.0–0.1)
Basophils Relative: 2 %
Eosinophils Absolute: 0.2 10*3/uL (ref 0.0–0.5)
Eosinophils Relative: 5 %
HCT: 25.1 % — ABNORMAL LOW (ref 39.0–52.0)
Hemoglobin: 7.3 g/dL — ABNORMAL LOW (ref 13.0–17.0)
Immature Granulocytes: 1 %
Lymphocytes Relative: 10 %
Lymphs Abs: 0.4 10*3/uL — ABNORMAL LOW (ref 0.7–4.0)
MCH: 29.9 pg (ref 26.0–34.0)
MCHC: 29.1 g/dL — ABNORMAL LOW (ref 30.0–36.0)
MCV: 102.9 fL — ABNORMAL HIGH (ref 80.0–100.0)
Monocytes Absolute: 0.3 10*3/uL (ref 0.1–1.0)
Monocytes Relative: 8 %
Neutro Abs: 3.1 10*3/uL (ref 1.7–7.7)
Neutrophils Relative %: 74 %
Platelets: 123 10*3/uL — ABNORMAL LOW (ref 150–400)
RBC: 2.44 MIL/uL — ABNORMAL LOW (ref 4.22–5.81)
RDW: 18.2 % — ABNORMAL HIGH (ref 11.5–15.5)
WBC: 4 10*3/uL (ref 4.0–10.5)
nRBC: 0 % (ref 0.0–0.2)

## 2021-09-04 LAB — SAMPLE TO BLOOD BANK

## 2021-09-04 MED ORDER — CYANOCOBALAMIN 1000 MCG/ML IJ SOLN
1000.0000 ug | Freq: Once | INTRAMUSCULAR | Status: AC
  Administered 2021-09-04: 1000 ug via INTRAMUSCULAR
  Filled 2021-09-04: qty 1

## 2021-09-04 NOTE — Progress Notes (Signed)
Jeffrey [name] presents today for B12 injection per the provider's orders.  Stable during  administration without incident; injection site WNL; see MAR for injection details.  Patient tolerated procedure well and without incident.  No questions or complaints noted at this time. Discharge from clinic via wheel chair in stable condition.  Alert and oriented X 3.  Follow up with [facility] as scheduled.  ?

## 2021-09-04 NOTE — Patient Instructions (Signed)
[facility]  Discharge Instructions: Thank you for choosing [facility] to provide your oncology and hematology care.  If you have a lab appointment with the Cancer Center, please come in thru the Main Entrance and check in at the main information desk.  Wear comfortable clothing and clothing appropriate for easy access to any Portacath or PICC line.   We strive to give you quality time with your provider. You may need to reschedule your appointment if you arrive late (15 or more minutes).  Arriving late affects you and other patients whose appointments are after yours.  Also, if you miss three or more appointments without notifying the office, you may be dismissed from the clinic at the provider's discretion.      For prescription refill requests, have your pharmacy contact our office and allow 72 hours for refills to be completed.    Today you received the following chemotherapy and/or immunotherapy agents B12      To help prevent nausea and vomiting after your treatment, we encourage you to take your nausea medication as directed.  BELOW ARE SYMPTOMS THAT SHOULD BE REPORTED IMMEDIATELY: *FEVER GREATER THAN 100.4 F (38 C) OR HIGHER *CHILLS OR SWEATING *NAUSEA AND VOMITING THAT IS NOT CONTROLLED WITH YOUR NAUSEA MEDICATION *UNUSUAL SHORTNESS OF BREATH *UNUSUAL BRUISING OR BLEEDING *URINARY PROBLEMS (pain or burning when urinating, or frequent urination) *BOWEL PROBLEMS (unusual diarrhea, constipation, pain near the anus) TENDERNESS IN MOUTH AND THROAT WITH OR WITHOUT PRESENCE OF ULCERS (sore throat, sores in mouth, or a toothache) UNUSUAL RASH, SWELLING OR PAIN  UNUSUAL VAGINAL DISCHARGE OR ITCHING   Items with * indicate a potential emergency and should be followed up as soon as possible or go to the Emergency Department if any problems should occur.  Please show the CHEMOTHERAPY ALERT CARD or IMMUNOTHERAPY ALERT CARD at check-in to the Emergency Department  and triage nurse.  Should you have questions after your visit or need to cancel or reschedule your appointment, please contact [facility] [phone]  and follow the prompts.  Office hours are 8:00 a.m. to 4:30 p.m. Monday - Friday. Please note that voicemails left after 4:00 p.m. may not be returned until the following business day.  We are closed weekends and major holidays. You have access to a nurse at all times for urgent questions. Please call the main number to the clinic [phone] and follow the prompts.  For any non-urgent questions, you may also contact your provider using MyChart. We now offer e-Visits for anyone 18 and older to request care online for non-urgent symptoms. For details visit [email or web address].   Also download the MyChart app! Go to the app store, search "MyChart", open the app, select , and log in with your MyChart username and password.  Due to Covid, a mask is required upon entering the hospital/clinic. If you do not have a mask, one will be given to you upon arrival. For doctor visits, patients may have 1 support person aged 18 or older with them. For treatment visits, patients cannot have anyone with them due to current Covid guidelines and our immunocompromised population.  

## 2021-09-09 ENCOUNTER — Ambulatory Visit (INDEPENDENT_AMBULATORY_CARE_PROVIDER_SITE_OTHER): Payer: Medicare Other | Admitting: Physician Assistant

## 2021-09-09 DIAGNOSIS — R339 Retention of urine, unspecified: Secondary | ICD-10-CM

## 2021-09-09 NOTE — Progress Notes (Signed)
Simple Catheter Placement ? ?Due to urinary retention patient is present today for a foley cath placement.  Patient was cleaned and prepped in a sterile fashion with betadine. A 16 FR foley catheter was inserted, urine return was noted  61m, urine was yellow in color.  The balloon was filled with 10cc of sterile water.  A leg bag was attached for drainage. Patient was also given a night bag to take home and was given instruction on how to change from one bag to another.  Patient was given instruction on proper catheter care.  Patient tolerated well, no complications were noted  ? ?Performed by: Kessa Fairbairn LPN ? ?Additional notes/ Follow up: 1 month cath change ?

## 2021-09-09 NOTE — Patient Instructions (Signed)

## 2021-09-11 ENCOUNTER — Inpatient Hospital Stay (HOSPITAL_COMMUNITY): Payer: Medicare Other

## 2021-09-11 DIAGNOSIS — D500 Iron deficiency anemia secondary to blood loss (chronic): Secondary | ICD-10-CM

## 2021-09-11 DIAGNOSIS — D649 Anemia, unspecified: Secondary | ICD-10-CM

## 2021-09-11 DIAGNOSIS — E538 Deficiency of other specified B group vitamins: Secondary | ICD-10-CM | POA: Diagnosis not present

## 2021-09-11 LAB — CBC WITH DIFFERENTIAL/PLATELET
Abs Immature Granulocytes: 0.01 10*3/uL (ref 0.00–0.07)
Basophils Absolute: 0.1 10*3/uL (ref 0.0–0.1)
Basophils Relative: 1 %
Eosinophils Absolute: 0.3 10*3/uL (ref 0.0–0.5)
Eosinophils Relative: 7 %
HCT: 25.1 % — ABNORMAL LOW (ref 39.0–52.0)
Hemoglobin: 7.4 g/dL — ABNORMAL LOW (ref 13.0–17.0)
Immature Granulocytes: 0 %
Lymphocytes Relative: 12 %
Lymphs Abs: 0.4 10*3/uL — ABNORMAL LOW (ref 0.7–4.0)
MCH: 30.7 pg (ref 26.0–34.0)
MCHC: 29.5 g/dL — ABNORMAL LOW (ref 30.0–36.0)
MCV: 104.1 fL — ABNORMAL HIGH (ref 80.0–100.0)
Monocytes Absolute: 0.3 10*3/uL (ref 0.1–1.0)
Monocytes Relative: 10 %
Neutro Abs: 2.5 10*3/uL (ref 1.7–7.7)
Neutrophils Relative %: 70 %
Platelets: 139 10*3/uL — ABNORMAL LOW (ref 150–400)
RBC: 2.41 MIL/uL — ABNORMAL LOW (ref 4.22–5.81)
RDW: 18.2 % — ABNORMAL HIGH (ref 11.5–15.5)
WBC: 3.6 10*3/uL — ABNORMAL LOW (ref 4.0–10.5)
nRBC: 0 % (ref 0.0–0.2)

## 2021-09-11 LAB — SAMPLE TO BLOOD BANK

## 2021-09-11 NOTE — Progress Notes (Signed)
Patient presents today for possible blood transfusion.  Hgb noted to be 7.4.  Patient states that he feels good and has had no dizziness or SOB.  Per providers parameters patient will not receive transfusion today.  Advised the patient to call the clinic if he has any issues.  Patient agreeable.   ?

## 2021-09-17 ENCOUNTER — Other Ambulatory Visit (HOSPITAL_COMMUNITY): Payer: Medicare Other

## 2021-09-18 ENCOUNTER — Inpatient Hospital Stay (HOSPITAL_COMMUNITY): Payer: Medicare Other

## 2021-09-18 ENCOUNTER — Other Ambulatory Visit (HOSPITAL_COMMUNITY): Payer: Medicare Other

## 2021-09-18 DIAGNOSIS — D61818 Other pancytopenia: Secondary | ICD-10-CM

## 2021-09-18 DIAGNOSIS — E538 Deficiency of other specified B group vitamins: Secondary | ICD-10-CM | POA: Diagnosis not present

## 2021-09-18 DIAGNOSIS — D649 Anemia, unspecified: Secondary | ICD-10-CM

## 2021-09-18 DIAGNOSIS — D500 Iron deficiency anemia secondary to blood loss (chronic): Secondary | ICD-10-CM

## 2021-09-18 LAB — CBC WITH DIFFERENTIAL/PLATELET
Abs Immature Granulocytes: 0.01 10*3/uL (ref 0.00–0.07)
Basophils Absolute: 0.1 10*3/uL (ref 0.0–0.1)
Basophils Relative: 2 %
Eosinophils Absolute: 0.1 10*3/uL (ref 0.0–0.5)
Eosinophils Relative: 4 %
HCT: 24.9 % — ABNORMAL LOW (ref 39.0–52.0)
Hemoglobin: 7.2 g/dL — ABNORMAL LOW (ref 13.0–17.0)
Immature Granulocytes: 0 %
Lymphocytes Relative: 9 %
Lymphs Abs: 0.3 10*3/uL — ABNORMAL LOW (ref 0.7–4.0)
MCH: 29 pg (ref 26.0–34.0)
MCHC: 28.9 g/dL — ABNORMAL LOW (ref 30.0–36.0)
MCV: 100.4 fL — ABNORMAL HIGH (ref 80.0–100.0)
Monocytes Absolute: 0.3 10*3/uL (ref 0.1–1.0)
Monocytes Relative: 8 %
Neutro Abs: 2.8 10*3/uL (ref 1.7–7.7)
Neutrophils Relative %: 77 %
Platelets: 123 10*3/uL — ABNORMAL LOW (ref 150–400)
RBC: 2.48 MIL/uL — ABNORMAL LOW (ref 4.22–5.81)
RDW: 17.7 % — ABNORMAL HIGH (ref 11.5–15.5)
WBC: 3.6 10*3/uL — ABNORMAL LOW (ref 4.0–10.5)
nRBC: 0 % (ref 0.0–0.2)

## 2021-09-18 LAB — VITAMIN B12: Vitamin B-12: 1324 pg/mL — ABNORMAL HIGH (ref 180–914)

## 2021-09-18 LAB — COMPREHENSIVE METABOLIC PANEL
ALT: 21 U/L (ref 0–44)
AST: 20 U/L (ref 15–41)
Albumin: 2.3 g/dL — ABNORMAL LOW (ref 3.5–5.0)
Alkaline Phosphatase: 224 U/L — ABNORMAL HIGH (ref 38–126)
Anion gap: 10 (ref 5–15)
BUN: 21 mg/dL (ref 8–23)
CO2: 28 mmol/L (ref 22–32)
Calcium: 8.3 mg/dL — ABNORMAL LOW (ref 8.9–10.3)
Chloride: 94 mmol/L — ABNORMAL LOW (ref 98–111)
Creatinine, Ser: 2.21 mg/dL — ABNORMAL HIGH (ref 0.61–1.24)
GFR, Estimated: 30 mL/min — ABNORMAL LOW (ref >60)
Glucose, Bld: 243 mg/dL — ABNORMAL HIGH (ref 70–99)
Potassium: 3.1 mmol/L — ABNORMAL LOW (ref 3.5–5.1)
Sodium: 132 mmol/L — ABNORMAL LOW (ref 135–145)
Total Bilirubin: 0.7 mg/dL (ref 0.3–1.2)
Total Protein: 6.9 g/dL (ref 6.5–8.1)

## 2021-09-18 LAB — IRON AND TIBC
Iron: 35 ug/dL — ABNORMAL LOW (ref 45–182)
Saturation Ratios: 14 % — ABNORMAL LOW (ref 17.9–39.5)
TIBC: 252 ug/dL (ref 250–450)
UIBC: 217 ug/dL

## 2021-09-18 LAB — SAMPLE TO BLOOD BANK

## 2021-09-18 LAB — FERRITIN: Ferritin: 198 ng/mL (ref 24–336)

## 2021-09-18 NOTE — Progress Notes (Signed)
Patient her to clinic today for poss blood. Hemoglobin is 7.2 today. Patient did not want blood today. Denies any shortness of breath, no dizziness. Energy is good. Will follow up in one week to reassess with labs and possible blood.  ?

## 2021-09-21 LAB — METHYLMALONIC ACID, SERUM: Methylmalonic Acid, Quantitative: 526 nmol/L — ABNORMAL HIGH (ref 0–378)

## 2021-09-23 ENCOUNTER — Encounter (HOSPITAL_COMMUNITY): Payer: Self-pay

## 2021-09-23 ENCOUNTER — Telehealth: Payer: Self-pay

## 2021-09-23 ENCOUNTER — Ambulatory Visit (HOSPITAL_COMMUNITY)
Admission: RE | Admit: 2021-09-23 | Discharge: 2021-09-23 | Disposition: A | Discharge status: Home / Self Care | Payer: Medicare Other | Source: Ambulatory Visit | Attending: Gastroenterology | Admitting: Gastroenterology

## 2021-09-23 ENCOUNTER — Other Ambulatory Visit: Payer: Self-pay | Admitting: Gastroenterology

## 2021-09-23 DIAGNOSIS — R188 Other ascites: Secondary | ICD-10-CM | POA: Diagnosis present

## 2021-09-23 DIAGNOSIS — Z8619 Personal history of other infectious and parasitic diseases: Secondary | ICD-10-CM

## 2021-09-23 LAB — BODY FLUID CELL COUNT WITH DIFFERENTIAL
Eos, Fluid: 0 %
Lymphs, Fluid: 73 %
Monocyte-Macrophage-Serous Fluid: 22 % — ABNORMAL LOW (ref 50–90)
Neutrophil Count, Fluid: 5 % (ref 0–25)
Total Nucleated Cell Count, Fluid: 109 cu mm (ref 0–1000)

## 2021-09-23 LAB — GRAM STAIN

## 2021-09-23 MED ORDER — SULFAMETHOXAZOLE-TRIMETHOPRIM 800-160 MG PO TABS: ORAL_TABLET | ORAL | 1 refills | Status: DC

## 2021-09-23 NOTE — Telephone Encounter (Signed)
Lab called regarding the pt's gram stain that it was positive. The labs are in the pt's chart. Cyril Mourning ordered the lab ?

## 2021-09-23 NOTE — Progress Notes (Signed)
PT tolerated right sided paracentesis procedure well today and 4.1 Liters of cloudy straw colored fluid removed with labs collected and sent for processing. PT verbalized understanding of discharge instructions and left via wheelchair with no acute distress noted.  ?

## 2021-09-23 NOTE — Telephone Encounter (Signed)
See result note.  

## 2021-09-23 NOTE — Procedures (Signed)
PreOperative Dx: Alcoholic cirrhosis, ascites ?Postoperative Dx: Alcoholic cirrhosis, ascites ?Procedure:   US guided paracentesis ?Radiologist:  Thornton Papas ?Anesthesia:  10 ml of1% lidocaine ?Specimen:  4.1 L of cloudy straw colored ascitic fluid ?EBL:   < 1 ml ?Complications: None   ?

## 2021-09-24 ENCOUNTER — Ambulatory Visit (HOSPITAL_COMMUNITY): Payer: Medicare Other | Admitting: Physician Assistant

## 2021-09-24 ENCOUNTER — Ambulatory Visit (HOSPITAL_COMMUNITY): Payer: Medicare Other

## 2021-09-25 ENCOUNTER — Ambulatory Visit (HOSPITAL_COMMUNITY): Payer: Medicare Other

## 2021-09-25 ENCOUNTER — Inpatient Hospital Stay (HOSPITAL_COMMUNITY): Payer: Medicare Other

## 2021-09-25 ENCOUNTER — Ambulatory Visit (HOSPITAL_COMMUNITY): Payer: Medicare Other | Admitting: Hematology

## 2021-09-25 DIAGNOSIS — E538 Deficiency of other specified B group vitamins: Secondary | ICD-10-CM | POA: Diagnosis not present

## 2021-09-25 DIAGNOSIS — D500 Iron deficiency anemia secondary to blood loss (chronic): Secondary | ICD-10-CM

## 2021-09-25 DIAGNOSIS — D649 Anemia, unspecified: Secondary | ICD-10-CM

## 2021-09-25 LAB — CBC WITH DIFFERENTIAL/PLATELET
Abs Immature Granulocytes: 0.01 10*3/uL (ref 0.00–0.07)
Basophils Absolute: 0.1 10*3/uL (ref 0.0–0.1)
Basophils Relative: 2 %
Eosinophils Absolute: 0.1 10*3/uL (ref 0.0–0.5)
Eosinophils Relative: 4 %
HCT: 26 % — ABNORMAL LOW (ref 39.0–52.0)
Hemoglobin: 7.4 g/dL — ABNORMAL LOW (ref 13.0–17.0)
Immature Granulocytes: 0 %
Lymphocytes Relative: 8 %
Lymphs Abs: 0.3 10*3/uL — ABNORMAL LOW (ref 0.7–4.0)
MCH: 28.9 pg (ref 26.0–34.0)
MCHC: 28.5 g/dL — ABNORMAL LOW (ref 30.0–36.0)
MCV: 101.6 fL — ABNORMAL HIGH (ref 80.0–100.0)
Monocytes Absolute: 0.3 10*3/uL (ref 0.1–1.0)
Monocytes Relative: 8 %
Neutro Abs: 3.2 10*3/uL (ref 1.7–7.7)
Neutrophils Relative %: 78 %
Platelets: 135 10*3/uL — ABNORMAL LOW (ref 150–400)
RBC: 2.56 MIL/uL — ABNORMAL LOW (ref 4.22–5.81)
RDW: 16.8 % — ABNORMAL HIGH (ref 11.5–15.5)
WBC: 4 10*3/uL (ref 4.0–10.5)
nRBC: 0 % (ref 0.0–0.2)

## 2021-09-25 LAB — PATHOLOGIST SMEAR REVIEW

## 2021-09-25 LAB — SAMPLE TO BLOOD BANK

## 2021-09-25 NOTE — Progress Notes (Signed)
Pt presents today for possible blood transfusion per provider's order. Pt's hemoglobin is 7.4 today, pt denies shortness of breath, weakness, and fatigue at this time. Pt stated he felt good today no blood transfusion needed today. Pt will follow- up next week for labs and possible blood. Pt advised to call clinic if any changes occurs. Pt verbalized understanding.  ?

## 2021-09-28 LAB — CULTURE, BODY FLUID W GRAM STAIN -BOTTLE
Culture: NO GROWTH
Special Requests: ADEQUATE

## 2021-09-30 ENCOUNTER — Other Ambulatory Visit: Payer: Self-pay | Admitting: *Deleted

## 2021-10-01 NOTE — Progress Notes (Addendum)
? ?[facility] ?[address] ?[place], [place] [address] ? ? ?CLINIC:  ?Medical Oncology/Hematology ? ?PCP:  ?Monico Blitz, MD ?[address] ?[place] [address] ?[phone] ? ? ?REASON FOR VISIT:  ?Follow-up for iron deficiency anemia, anemia of ESRD ?  ?PRIOR THERAPY: Intermittent IV iron supplementation ?  ?CURRENT THERAPY: Epogen (at dialysis), IV iron supplementation, PRBC transfusions as needed, monthly B12 injections ? ?INTERVAL HISTORY:  ?Mr. Jeffrey [name] 76 y.o. male returns for routine follow-up of his iron deficiency anemia, anemia of ESRD, thrombocytopenia, and leukopenia.  He was last seen by Tarri Abernethy PA-C on 07/03/2021. ? ?At today's visit, he reports feeling fair.   ?He has not noticed any recent bright red blood per rectum or melena.  No epistaxis or hematuria. ?He continues to report easy bruising. ?He has continued to lose weight since his last appointment, down about 10 pounds over the past 3 months.  He reports that he is eating less than he used to and has been eating more salads lately.  He gets full earlier than he used to as well.  He will occasionally drink Glucerna.  He denies any fevers, chills, or frequent infections. ? ?Continues to have moderate fatigue, as well as pica, headaches, dyspnea on exertion.  He denies any restless legs, chest pain, lightheadedness, or syncope. ? ?He has 70% energy and 75% appetite. He endorses that he is maintaining a stable weight. ? ? ?REVIEW OF SYSTEMS:  ?Review of Systems  ?Constitutional:  Positive for appetite change and fatigue. Negative for chills, diaphoresis, fever and unexpected weight change.  ?HENT:   Negative for lump/mass and nosebleeds.   ?Eyes:  Negative for eye problems.  ?Respiratory:  Positive for shortness of breath (with exertion, on 3 L oxygen at home). Negative for cough and hemoptysis.   ?Cardiovascular:  Negative for chest pain, leg swelling and palpitations.  ?Gastrointestinal:  Positive for diarrhea, nausea and vomiting.  Negative for abdominal pain, blood in stool and constipation.  ?Genitourinary:  Negative for hematuria.   ?Musculoskeletal:  Positive for arthralgias and back pain.  ?Skin: Negative.   ?Neurological:  Positive for dizziness and headaches. Negative for light-headedness.  ?Hematological:  Bruises/bleeds easily.  ?Psychiatric/Behavioral:  Positive for depression.    ? ? ?PAST MEDICAL/SURGICAL HISTORY:  ?Past Medical History:  ?Diagnosis Date  ? A-fib ([place])   ? when initially stating dialysis january 2021 at [facility] per spouse; never heard anything else about it  ? Alcoholic cirrhosis ([place])   ? patient reports completing hep a and b vaccines in 2006  ? Anemia   ? has had 3 units of prbcs 2013  ? Anxiety   ? Arthritis   ? Asymptomatic gallstones   ? Ultrasound in 2006  ? B12 deficiency 11/21/2020  ? C. difficile colitis   ? Cholelithiasis   ? Chronic kidney disease   ? Dialysis M/W/F/Sa  ? Depression   ? Diabetes ([place])   ? Duodenal ulcer with hemorrhage   ? per patient in 2006 or 2007, records have been requested  ? Dyspnea   ? low oxygen level -  has O2 2 L all day  ? 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 see PSH  ? GERD (gastroesophageal reflux disease)   ? Heart burn   ? History of alcohol abuse   ? quit 05/2013  ? HOH (hard of hearing)   ? Hypertension   ? Liver cirrhosis ([place])   ? ETOH  ? SBP (spontaneous bacterial peritonitis) (  [place])   ? 2020, November 2021.   ? Splenomegaly   ? Ultrasound in 2006  ? ?Past Surgical History:  ?Procedure Laterality Date  ? AGILE CAPSULE N/A 09/20/2019  ? Procedure: AGILE CAPSULE;  Surgeon: Daneil Dolin, MD;  [facility];  Service: Endoscopy;  Laterality: N/A;  ? APPENDECTOMY    ? AV FISTULA PLACEMENT Left 08/17/2019  ? Procedure: ARTERIOVENOUS (AV) FISTULA CREATION VERSES ARTERIOVENOUS GRAFT;  Surgeon: [name], MD;  Location: [facility];  Service: Vascular;  Laterality: Left;  ? BASCILIC VEIN TRANSPOSITION Left 09/28/2019  ? Procedure: LEFT SECOND STAGE BASCILIC VEIN  TRANSPOSITION;  Surgeon: [name], MD;  Location: [facility];  Service: Vascular;  Laterality: Left;  ? BIOPSY  02/03/2018  ? Procedure: BIOPSY;  Surgeon: Daneil Dolin, MD;  [facility];  Service: Endoscopy;;  bx of gastric polyps  ? BIOPSY  09/19/2019  ? Procedure: BIOPSY;  Surgeon: Danie Binder, MD;  [facility];  Service: Endoscopy;;  ? BIOPSY  01/16/2021  ? Procedure: BIOPSY;  Surgeon: Daneil Dolin, MD;  [facility];  Service: Endoscopy;;  ? CATARACT EXTRACTION Right   ? COLONOSCOPY  03/10/2005  ? Rectal polyp as described above, removed with snare. Left sided  diverticula. The remainder of the colonic mucosa appeared normal. Inflammed polyp on path.  ? COLONOSCOPY  04/1999  ? Dr. Thea Silversmith polyps removed,  Path showed hyperplastic  ? COLONOSCOPY  10/2015  ? Dr. Britta Mccreedy: diverticulosis, single sessile tubular adenoma 3-72m in size removed from descending colon.   ? COLONOSCOPY WITH ESOPHAGOGASTRODUODENOSCOPY (EGD)  03/31/2012  ? RVOH:[id number]. Colonic diverticulosis. tubular adenoma colon  ? COLONOSCOPY WITH ESOPHAGOGASTRODUODENOSCOPY (EGD)  06/2019  ? FORSYTH: small esophageal varices without high risk stigmata, single large AVM on lesser curvature in gastric body s/p APC ablation, gastric antral and duodenal bulb polyposis. No significant source to explain transfusion dependent anemia. Colonoscopy with portal colopathy and diffuse edema, changes of Cdiff colitis on colonoscopy  ? COLONOSCOPY WITH PROPOFOL N/A 01/19/2021  ? Surgeon: CDaryel November MD; 15 mm polyp in descending colon removed piecemeal, 6 mm polyp in ascending colon and 12 mm polyp in sigmoid colon not removed due to high bleeding risk, benign polypoid lesion at splenic flexure, diverticulosis in sigmoid and descending colon, rectal varices.  ? COLONOSCOPY WITH PROPOFOL N/A 01/16/2021  ? Surgeon: RDaneil Dolin MD;  Portal colopathy, large rectal varices, polypoid mass at splenic flexure  biopsy, two 1 cm left colon polyps not removed, left-sided diverticulosis.  Reported patient had bleeding from splenic flexure lesion.  Pathology with acutely inflamed colonic mucosa with localized ulceration and prominent reactive changes.  ? ESOPHAGEAL BANDING N/A 02/03/2018  ? Procedure: ESOPHAGEAL BANDING;  Surgeon: RDaneil Dolin MD;  [facility];  Service: Endoscopy;  Laterality: N/A;  ? ESOPHAGEAL BANDING  01/16/2021  ? Procedure: ESOPHAGEAL BANDING;  Surgeon: RDaneil Dolin MD;  [facility];  Service: Endoscopy;;  ? ESOPHAGOGASTRODUODENOSCOPY  01/15/2005  ? Three columns grade 1 to 2 esophageal varices, otherwise normal esophageal mucosa.  Esophagus was not manipulated otherwise./Nodularity of the antrum with overlying erosions, nonspecific finding. Path showed rare H.pylori  ? ESOPHAGOGASTRODUODENOSCOPY  09/2008  ? Dr. RGaylene Brookscolumns of grade 2-3 esoph varices, only one column was prominent. Portal gastropathy, multiple gastrc polyps at antrum, two were 2cm with black eschar, bulbar polyps, bulbar erosions  ? ESOPHAGOGASTRODUODENOSCOPY  11/2004  ? Dr. FBrantley Stage3  esoph varices  ? ESOPHAGOGASTRODUODENOSCOPY  03/31/2012  ? RMR: 4 columns(3-GR 2, 1-GR1) non-bleeding esophageal varices, portal gastropathy, small HH, early GAVE, multiple gastric polyps   ? ESOPHAGOGASTRODUODENOSCOPY (EGD) WITH PROPOFOL N/A 02/03/2018  ? Dr. Gala Romney: Esophageal varices, 3 columns grade 1-2.  Portal hypertensive gastropathy.  Multiple gastric polyps, biopsy consistent with hyperplastic.  ? ESOPHAGOGASTRODUODENOSCOPY (EGD) WITH PROPOFOL N/A 09/19/2019  ? Fields: grade I and II esophageal varcies, mild portal hypertensive gastropathy, moderate gastritis but no H. pylori, single hyperplastic gastric polyp removed, obvious source for melena/transfusion dependent anemia not identified, may be due to friable gastric and duodenal mucosa in the setting of portal hypertension  ?  ESOPHAGOGASTRODUODENOSCOPY (EGD) WITH PROPOFOL N/A 01/16/2021  ? Surgeon: Daneil Dolin, MD; Portal hypertensive gastropathy, grade 2 esophageal varices with bleeding stigmata s/p banding x4, slight nodularity and con

## 2021-10-02 ENCOUNTER — Inpatient Hospital Stay (HOSPITAL_COMMUNITY): Payer: Medicare Other | Attending: Hematology

## 2021-10-02 ENCOUNTER — Inpatient Hospital Stay (HOSPITAL_COMMUNITY): Payer: Medicare Other

## 2021-10-02 ENCOUNTER — Inpatient Hospital Stay (HOSPITAL_COMMUNITY): Payer: Medicare Other | Admitting: Physician Assistant

## 2021-10-02 VITALS — BP 161/70 | HR 85 | Temp 97.3°F | Resp 18 | Ht 65.16 in | Wt 172.4 lb

## 2021-10-02 DIAGNOSIS — D500 Iron deficiency anemia secondary to blood loss (chronic): Secondary | ICD-10-CM

## 2021-10-02 DIAGNOSIS — D61818 Other pancytopenia: Secondary | ICD-10-CM | POA: Diagnosis not present

## 2021-10-02 DIAGNOSIS — N186 End stage renal disease: Secondary | ICD-10-CM

## 2021-10-02 DIAGNOSIS — E538 Deficiency of other specified B group vitamins: Secondary | ICD-10-CM | POA: Insufficient documentation

## 2021-10-02 DIAGNOSIS — D509 Iron deficiency anemia, unspecified: Secondary | ICD-10-CM | POA: Diagnosis present

## 2021-10-02 LAB — CBC WITH DIFFERENTIAL/PLATELET
Abs Immature Granulocytes: 0.01 10*3/uL (ref 0.00–0.07)
Basophils Absolute: 0.1 10*3/uL (ref 0.0–0.1)
Basophils Relative: 2 %
Eosinophils Absolute: 0.1 10*3/uL (ref 0.0–0.5)
Eosinophils Relative: 4 %
HCT: 28.8 % — ABNORMAL LOW (ref 39.0–52.0)
Hemoglobin: 8.3 g/dL — ABNORMAL LOW (ref 13.0–17.0)
Immature Granulocytes: 0 %
Lymphocytes Relative: 12 %
Lymphs Abs: 0.4 10*3/uL — ABNORMAL LOW (ref 0.7–4.0)
MCH: 29.4 pg (ref 26.0–34.0)
MCHC: 28.8 g/dL — ABNORMAL LOW (ref 30.0–36.0)
MCV: 102.1 fL — ABNORMAL HIGH (ref 80.0–100.0)
Monocytes Absolute: 0.3 10*3/uL (ref 0.1–1.0)
Monocytes Relative: 9 %
Neutro Abs: 2.2 10*3/uL (ref 1.7–7.7)
Neutrophils Relative %: 73 %
Platelets: 125 10*3/uL — ABNORMAL LOW (ref 150–400)
RBC: 2.82 MIL/uL — ABNORMAL LOW (ref 4.22–5.81)
RDW: 18.3 % — ABNORMAL HIGH (ref 11.5–15.5)
WBC: 3 10*3/uL — ABNORMAL LOW (ref 4.0–10.5)
nRBC: 0 % (ref 0.0–0.2)

## 2021-10-02 MED ORDER — CYANOCOBALAMIN 1000 MCG/ML IJ SOLN
1000.0000 ug | Freq: Once | INTRAMUSCULAR | Status: AC
  Administered 2021-10-02: 1000 ug via INTRAMUSCULAR
  Filled 2021-10-02: qty 1

## 2021-10-02 NOTE — Progress Notes (Signed)
Pt presents today for possible blood transfusion per provider's order. Pt's hemoglobin is 8.3 today. No blood needed today per R.Pennington-PA. ?

## 2021-10-02 NOTE — Progress Notes (Signed)
Patient tolerated injection with no complaints voiced.  Site clean and dry with no bruising or swelling noted at site.  See MAR for details.  Band aid applied.  Patient stable during and after injection.  Vss with discharge and left in satisfactory condition with no s/s of distress noted.  

## 2021-10-02 NOTE — Patient Instructions (Signed)
[facility]  Discharge Instructions: Thank you for choosing [facility] to provide your oncology and hematology care.  If you have a lab appointment with the Cancer Center, please come in thru the Main Entrance and check in at the main information desk.  Wear comfortable clothing and clothing appropriate for easy access to any Portacath or PICC line.   We strive to give you quality time with your provider. You may need to reschedule your appointment if you arrive late (15 or more minutes).  Arriving late affects you and other patients whose appointments are after yours.  Also, if you miss three or more appointments without notifying the office, you may be dismissed from the clinic at the provider's discretion.      For prescription refill requests, have your pharmacy contact our office and allow 72 hours for refills to be completed.        To help prevent nausea and vomiting after your treatment, we encourage you to take your nausea medication as directed.  BELOW ARE SYMPTOMS THAT SHOULD BE REPORTED IMMEDIATELY: *FEVER GREATER THAN 100.4 F (38 C) OR HIGHER *CHILLS OR SWEATING *NAUSEA AND VOMITING THAT IS NOT CONTROLLED WITH YOUR NAUSEA MEDICATION *UNUSUAL SHORTNESS OF BREATH *UNUSUAL BRUISING OR BLEEDING *URINARY PROBLEMS (pain or burning when urinating, or frequent urination) *BOWEL PROBLEMS (unusual diarrhea, constipation, pain near the anus) TENDERNESS IN MOUTH AND THROAT WITH OR WITHOUT PRESENCE OF ULCERS (sore throat, sores in mouth, or a toothache) UNUSUAL RASH, SWELLING OR PAIN  UNUSUAL VAGINAL DISCHARGE OR ITCHING   Items with * indicate a potential emergency and should be followed up as soon as possible or go to the Emergency Department if any problems should occur.  Please show the CHEMOTHERAPY ALERT CARD or IMMUNOTHERAPY ALERT CARD at check-in to the Emergency Department and triage nurse.  Should you have questions after your visit or need to cancel  or reschedule your appointment, please contact [facility] [phone]  and follow the prompts.  Office hours are 8:00 a.m. to 4:30 p.m. Monday - Friday. Please note that voicemails left after 4:00 p.m. may not be returned until the following business day.  We are closed weekends and major holidays. You have access to a nurse at all times for urgent questions. Please call the main number to the clinic [phone] and follow the prompts.  For any non-urgent questions, you may also contact your provider using MyChart. We now offer e-Visits for anyone 18 and older to request care online for non-urgent symptoms. For details visit [email or web address].   Also download the MyChart app! Go to the app store, search "MyChart", open the app, select [facility], and log in with your MyChart username and password.  Due to Covid, a mask is required upon entering the hospital/clinic. If you do not have a mask, one will be given to you upon arrival. For doctor visits, patients may have 1 support person aged 18 or older with them. For treatment visits, patients cannot have anyone with them due to current Covid guidelines and our immunocompromised population.  

## 2021-10-02 NOTE — Patient Instructions (Signed)
[place] [facility] ?Discharge Instructions ? ?You were seen today by Tarri Abernethy PA-C for your follow-up visit.   ? ?Your blood levels are essentially stable at your baseline.  You do not need any blood transfusion today, but you may benefit from some additional IV iron. ? ?PLAN: ?- Monthly CBC and possible blood transfusion if needed ?- Monthly vitamin B12 injection ?- IV iron x3 ? ?FOLLOW-UP APPOINTMENT: Repeat full lab panel in 3 months with office visit the following week ? ? ?Thank you for choosing [place] [facility] to provide your oncology and hematology care.  To afford each patient quality time with our provider, please arrive at least 15 minutes before your scheduled appointment time.  ? ?If you have a lab appointment with the [facility] please come in thru the Main Entrance and check in at the main information desk. ? ?You need to re-schedule your appointment should you arrive 10 or more minutes late.  We strive to give you quality time with our providers, and arriving late affects you and other patients whose appointments are after yours.  Also, if you no show three or more times for appointments you may be dismissed from the clinic at the providers discretion.     ?Again, thank you for choosing [facility].  Our hope is that these requests will decrease the amount of time that you wait before being seen by our physicians.       ?_____________________________________________________________ ? ?Should you have questions after your visit to [facility], please contact our office at [phone] and follow the prompts.  Our office hours are 8:00 a.m. and 4:30 p.m. Monday - Friday.  Please note that voicemails left after 4:00 p.m. may not be returned until the following business day.  We are closed weekends and major holidays.  You do have access to a nurse 24-7, just call the main number to the clinic  [phone] and do not press any options, hold on the line and a nurse will answer the phone.   ? ?For prescription refill requests, have your pharmacy contact our office and allow 72 hours.   ? ?Due to Covid, you will need to wear a mask upon entering the hospital. If you do not have a mask, a mask will be given to you at the Main Entrance upon arrival. For doctor visits, patients may have 1 support person age 11 or older with them. For treatment visits, patients can not have anyone with them due to social distancing guidelines and our immunocompromised population.  ? ? ? ?

## 2021-10-02 NOTE — Patient Outreach (Signed)
[facility]) Care Management ?RN Health Coach Note ? ? ?10/02/2021 ?Name:  Jeffrey [name] MRN:  [id number] DOB:  05/29/1946 ? ?Summary: ?Patient fasting blood sugar is 200. His A1C is 5.5 decreased from 5.8. His appetite is good. Per wife he has been eating Orma Render it.  Patient is having some swelling in his legs.  He sleeps with a wedge under his legs at HS.  Patient has fallen twice without injuries.  ? ?Recommendations/Changes made from today's visit: ?Medication adherence ?Monitor dietary intake ? ? ? ?Subjective: ?SAVA PROBY is an 76 y.o. year old male who is a primary patient of Monico Blitz, MD. The care management team was consulted for assistance with care management and/or care coordination needs.   ? ?RN Health Coach completed Telephone Visit today.  ? ?Objective: ? ?Medications Reviewed Today   ? ? Reviewed by Nilda Riggs., LPN (Licensed Practical Nurse) on 10/02/21 at [place] List Status: <None>  ? ?Medication Order Taking? Sig Documenting Provider Last Dose Status Informant  ?acetaminophen (TYLENOL) 650 MG CR tablet [id number] Yes Take 650 mg by mouth every 8 (eight) hours as needed for pain. [provider] Taking Active Spouse/Significant Other  ?Alcohol Swabs (ALCOHOL PREP) 70 % PADS [id number] Yes Apply 1 each topically 3 (three) times daily. [provider] Taking Active Spouse/Significant Other  ?ALPRAZolam (XANAX) 0.5 MG tablet [id number] Yes Take 0.5 mg by mouth See admin instructions. Take one tablet (0.5 mg) by mouth on Monday, Wednesday, Friday before dialysis, may also take one tablet (0.5 mg) daily as needed for anxiety. [provider] Taking Active Spouse/Significant Other  ?Ascorbic Acid (VITAMIN C) 1000 MG tablet [id number] Yes Take 1,000 mg by mouth in the morning. [provider] Taking Active Spouse/Significant Other  ?BD PEN NEEDLE MICRO U/F 32G X 6 MM MISC [id number] Yes SMARTSIG:SUB-Q Daily PRN [provider] Taking  Active Spouse/Significant Other  ?calcium carbonate (TUMS - DOSED IN MG ELEMENTAL CALCIUM) 500 MG chewable tablet [id number] Yes Chew 1 tablet by mouth 3 (three) times daily with meals. [provider] Taking Active Spouse/Significant Other  ?Calcium Carbonate-Simethicone (ALKA-SELTZER HEARTBURN + GAS) 750-80 MG CHEW [id number] Yes Chew 1-2 tablets by mouth 2 (two) times daily as needed (acid reflux). [provider] Taking Active Spouse/Significant Other  ?cetirizine (ZYRTEC) 10 MG tablet [id number] Yes Take 10 mg by mouth daily. [provider] Taking Active Spouse/Significant Other  ?cholecalciferol (VITAMIN D3) 25 MCG (1000 UNIT) tablet [id number] Yes Take 1,000 Units by mouth daily. [provider] Taking Active Spouse/Significant Other  ?cloNIDine (CATAPRES) 0.1 MG tablet [id number] Yes Take 0.1 mg by mouth 2 (two) times daily. Take if blood pressure will not go down [provider] Taking Active Spouse/Significant Other  ?         ?Med Note Clydell Hakim   Thu Aug 21, 2021 12:59 PM) As needed  ?diltiazem (TIAZAC) 120 MG 24 hr capsule [id number] Yes Take 120 mg by mouth daily. [provider] Taking Active Spouse/Significant Other  ?famotidine (PEPCID) 20 MG tablet [id number] Yes Take 1 tablet (20 mg total) by mouth 2 (two) times daily. Barton Dubois, MD Taking Active Spouse/Significant Other  ?furosemide (LASIX) 40 MG tablet [id number] Yes Take 80 mg by mouth daily. [provider] Taking Active Spouse/Significant Other  ?gabapentin (NEURONTIN) 100 MG capsule [id number] Yes TAKE 1 CAPSULE BY MOUTH AT BEDTIME  ?Patient taking differently: Take 100 mg by mouth 3 (three) times daily.  ?  Waynetta Sandy, MD Taking Active Spouse/Significant Other  ?Garlic 0086 MG CAPS [id number] Yes Take 1,000 mg by mouth daily. [provider] Taking Active Spouse/Significant Other  ?GLUCAGEN HYPOKIT 1 MG SOLR injection [id number] Yes Inject 1 mg into  the muscle once as needed for low blood sugar. [provider] Taking Active Spouse/Significant Other  ?HUMALOG KWIKPEN 100 UNIT/ML KwikPen [id number] Yes 10-50 Units 3 (three) times daily. Sliding scale [provider] Taking Active Spouse/Significant Other  ?hydrOXYzine (ATARAX/VISTARIL) 25 MG tablet [id number] Yes Take 25 mg by mouth 3 (three) times daily. For itching [provider] Taking Active Spouse/Significant Other  ?iron polysaccharides (NIFEREX) 150 MG capsule [id number] Yes Take 150 mg by mouth daily with supper. [provider] Taking Active Spouse/Significant Other  ?         ?Med Note Anabel Bene R   Wed May 14, 2021  2:53 PM)    ?lidocaine-prilocaine (EMLA) cream [id number] Yes Apply 2 application topically daily. [provider] Taking Active Spouse/Significant Other  ?metoprolol succinate (TOPROL-XL) 25 MG 24 hr tablet [id number] Yes Take 1 tablet (25 mg total) by mouth See admin instructions. Take one tablet (25 mg) by mouth at bedtime every night and take one tablet (25 mg) in morning on  Sun, Tues, Thurs and Sat (non-dialysis days) Murlean Iba, MD Taking Active Spouse/Significant Other  ?midodrine (PROAMATINE) 10 MG tablet [id number] Yes Take by mouth. [provider] Taking Active Spouse/Significant Other  ?mirtazapine (REMERON) 30 MG tablet [id number] Yes Take 1 tablet (30 mg total) by mouth at bedtime.  ?Patient taking differently: Take 30 mg by mouth daily with supper.  ? Roxan Hockey, MD Taking Active Spouse/Significant Other  ?Multiple Vitamin (MULTIVITAMIN WITH MINERALS) TABS tablet [id number] Yes Take 1 tablet by mouth in the morning.  [provider] Taking Active Spouse/Significant Other  ?omega-3 acid ethyl esters (LOVAZA) 1 g capsule [id number] Yes Take 2 g by mouth 2 (two) times daily.  [provider] Taking Active Spouse/Significant Other  ?ondansetron (ZOFRAN) 4 MG tablet [id number] Yes Take 4 mg by  mouth every 8 (eight) hours as needed for nausea or vomiting. [provider] Taking Active Spouse/Significant Other  ?         ?Med Note Nat Christen   Fri Mar 29, 2020  8:44 AM)    ?oxyCODONE-acetaminophen (PERCOCET/ROXICET) 5-325 MG tablet [id number] Yes Take 1 tablet by mouth every 4 (four) hours as needed for moderate pain. [provider] Taking Active Spouse/Significant Other  ?pantoprazole (PROTONIX) 40 MG tablet [id number] Yes Take 40 mg by mouth daily. [provider] Taking Active Spouse/Significant Other  ?Ped Vitamins ACD Fl-Iron (TRI-VIT/FLUORIDE/IRON PO) [id number] Yes Take 1 tablet by mouth daily. [provider] Taking Active Spouse/Significant Other  ?         ?Med Note Wilmon Pali, MELISSA R   Wed May 14, 2021  2:58 PM) [facility]   ?pravastatin (PRAVACHOL) 20 MG tablet [id number] Yes Take 20 mg by mouth in the morning.  [provider] Taking Active Spouse/Significant Other  ?Probiotic Product (PROBIOTIC DAILY PO) [id number] Yes Take 1 tablet by mouth in the morning.  [provider] Taking Active Spouse/Significant Other  ?spironolactone (ALDACTONE) 50 MG tablet [id number] Yes Take 50 mg by mouth in the morning.  [provider] Taking Active Spouse/Significant Other  ?sulfamethoxazole-trimethoprim (BACTRIM DS) 800-160 MG tablet [id number] Yes Take 1 tablet by mouth once a week. Take every Friday after dialysis. Erenest Rasher, PA-C Taking Active   ?  thiamine (VITAMIN B-1) 100 MG tablet [id number] Yes Take 100 mg by mouth daily. [provider] Taking Active Spouse/Significant Other  ?tiZANidine (ZANAFLEX) 2 MG tablet [id number] Yes Take 2 mg by mouth 2 (two) times daily. [provider] Taking Active Spouse/Significant Other  ?TRESIBA FLEXTOUCH 200 UNIT/ML FlexTouch Pen [id number] Yes Inject 120 Units into the skin at bedtime. [provider] Taking Active Spouse/Significant Other  ?triamcinolone cream  (KENALOG) 0.1 % [id number] Yes Apply 1 application topically 2 (two) times daily as needed (dry skin Itching). [provider] Taking Active Spouse/Significant Other  ?venlafaxine (EFFEXOR) 75 MG tablet 12

## 2021-10-02 NOTE — Patient Instructions (Signed)
Visit Information ? ?Thank you for taking time to visit with me today. Please don't hesitate to contact me if I can be of assistance to you before our next scheduled telephone appointment. ? ?Following are the goals we discussed today:  ?Current Barriers:  ?Knowledge Deficits related to plan of care for management of DMII  ? ?RNCM Clinical Goal(s):  ?Patient will verbalize understanding of plan for management of DMII as evidenced by continuation of monitoring blood sugars and adhering to diabetic diet through collaboration with RN Care manager, provider, and care team.  ? ?Interventions: ?Inter-disciplinary care team collaboration (see longitudinal plan of care) ?Evaluation of current treatment plan related to  self management and patient's adherence to plan as established by provider ? ? ?Diabetes Interventions:  (Status:  Goal on track:  Yes.) Long Term Goal ?Assessed patient's understanding of A1c goal: <6.5% ?Provided education to patient about basic DM disease process ?Provided patient with written educational materials related to hypo and hyperglycemia and importance of correct treatment ?Advised patient, providing education and rationale, to check cbg 4 x a day and record, calling PCP  for findings outside established parameters ?Lab Results  ?Component Value Date  ? HGBA1C 6.1 (H) 04/10/2021  ? ? ?Patient Goals/Self-Care Activities: ?Take all medications as prescribed ?Attend all scheduled provider appointments ?Call pharmacy for medication refills 3-7 days in advance of running out of medications ?Perform all self care activities independently  ?Perform IADL's (shopping, preparing meals, housekeeping, managing finances) independently ?Call provider office for new concerns or questions  ?call the Suicide and Crisis Lifeline: 988 if experiencing a Mental Health or [place]  ?schedule appointment with eye doctor ?check blood sugar at prescribed times: 4 times daily ?check feet daily for cuts,  sores or redness ?take the blood sugar log to all doctor visits ?trim toenails straight across ?manage portion size ?wash and dry feet carefully every day ?wear comfortable, cotton socks ?wear comfortable, well-fitting shoes ? ?Follow Up Plan:  Telephone follow up appointment with care management team member scheduled for:  May 2023. ?The patient has been provided with contact information for the care management team and has been advised to call with any health related questions or concerns.   ? ?[id number] Patient A1C is 5.5 decreased from 5.8.  His fasting blood sugar is 200. Per patient wife he has been eating Orma Render it at [place].  His appetite is good.The cellulitis in legs have healed. He does have some swelling in his legs. He elevates his legs on a wedge at night. Patient blood pressure is better 120/55.  Patient fell x 2 since last outreach with no injuries.  ? ?Our next appointment is by telephone on January 13, 2022 ? ?Please call [name] RN [phone] if you need to cancel or reschedule your appointment.  ? ?Please call the Suicide and Crisis Lifeline: 988 if you are experiencing a Mental Health or [place] or need someone to talk to. ? ?The patient verbalized understanding of instructions, educational materials, and care plan provided today and agreed to receive a mailed copy of patient instructions, educational materials, and care plan.  ? ?Telephone follow up appointment with care management team member scheduled for: ?The patient has been provided with contact information for the care management team and has been advised to call with any health related questions or concerns.  ? ?SIGNATURE ? ?[name] BSN RN ?[facility] ?[phone] ? ? ? ? ?

## 2021-10-03 IMAGING — DX DG KNEE COMPLETE 4+V*R*
4 series · 4 of 4 positions shown · non-contrast
Comparison: None.

CLINICAL DATA: 73-year-old male with fall and trauma to the right
knee.

EXAM:
RIGHT KNEE - COMPLETE 4+ VIEW

[knee ap]
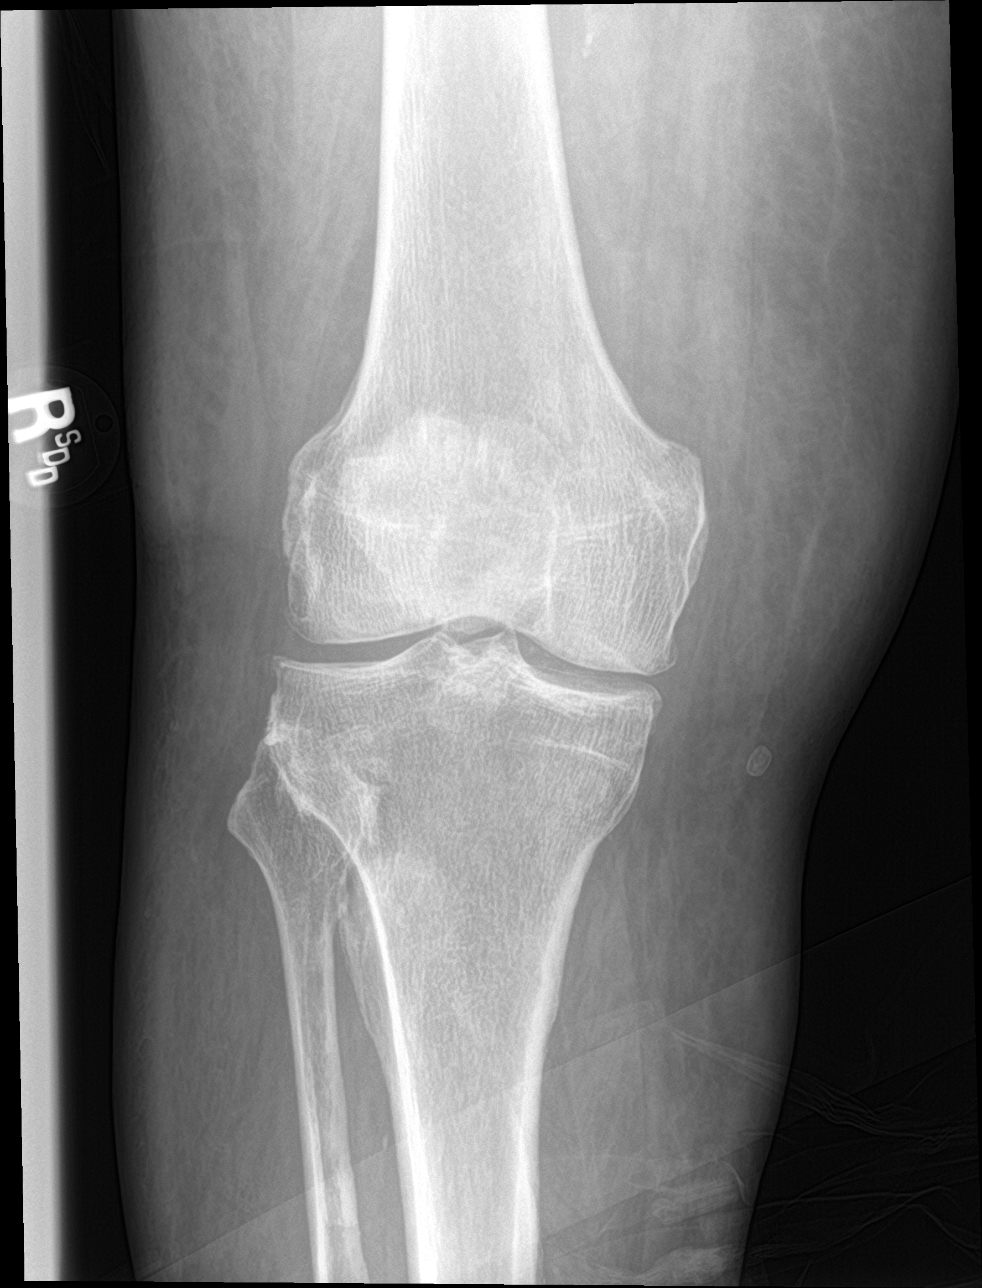

[knee obl (1 of 2)]
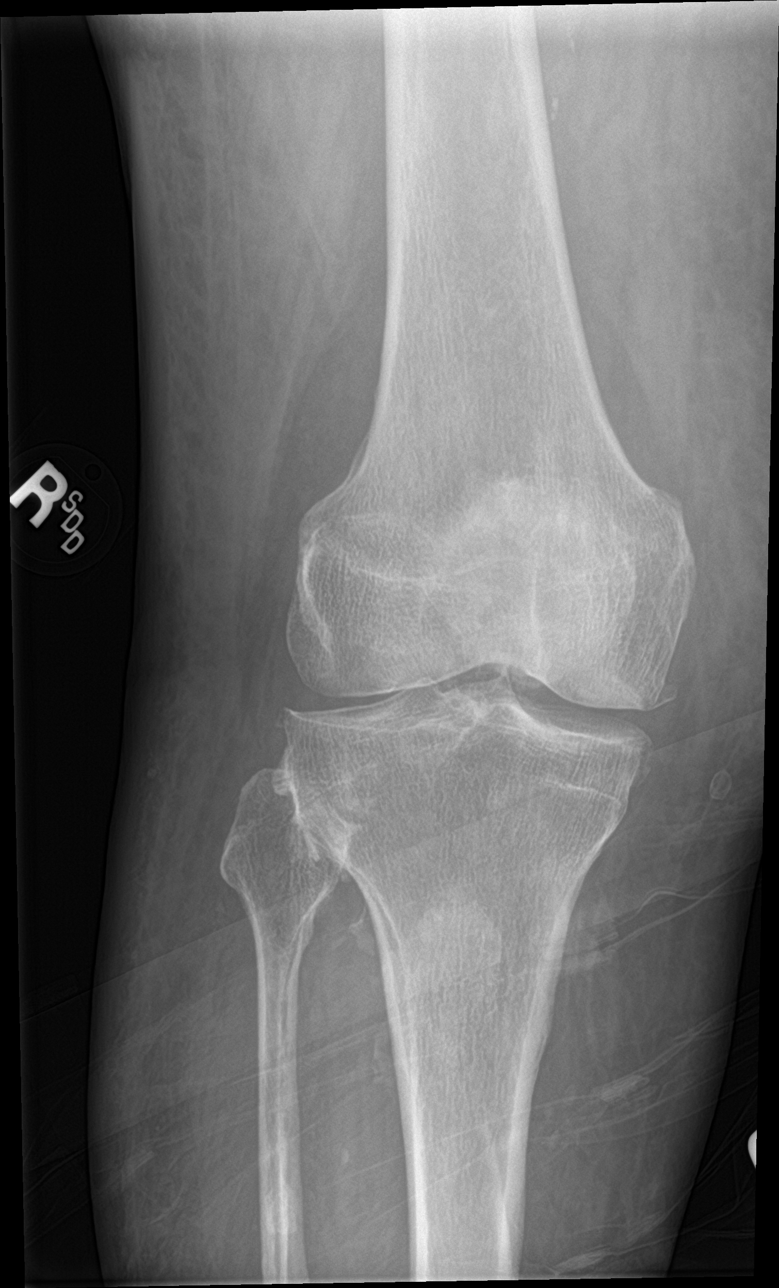

[knee lat]
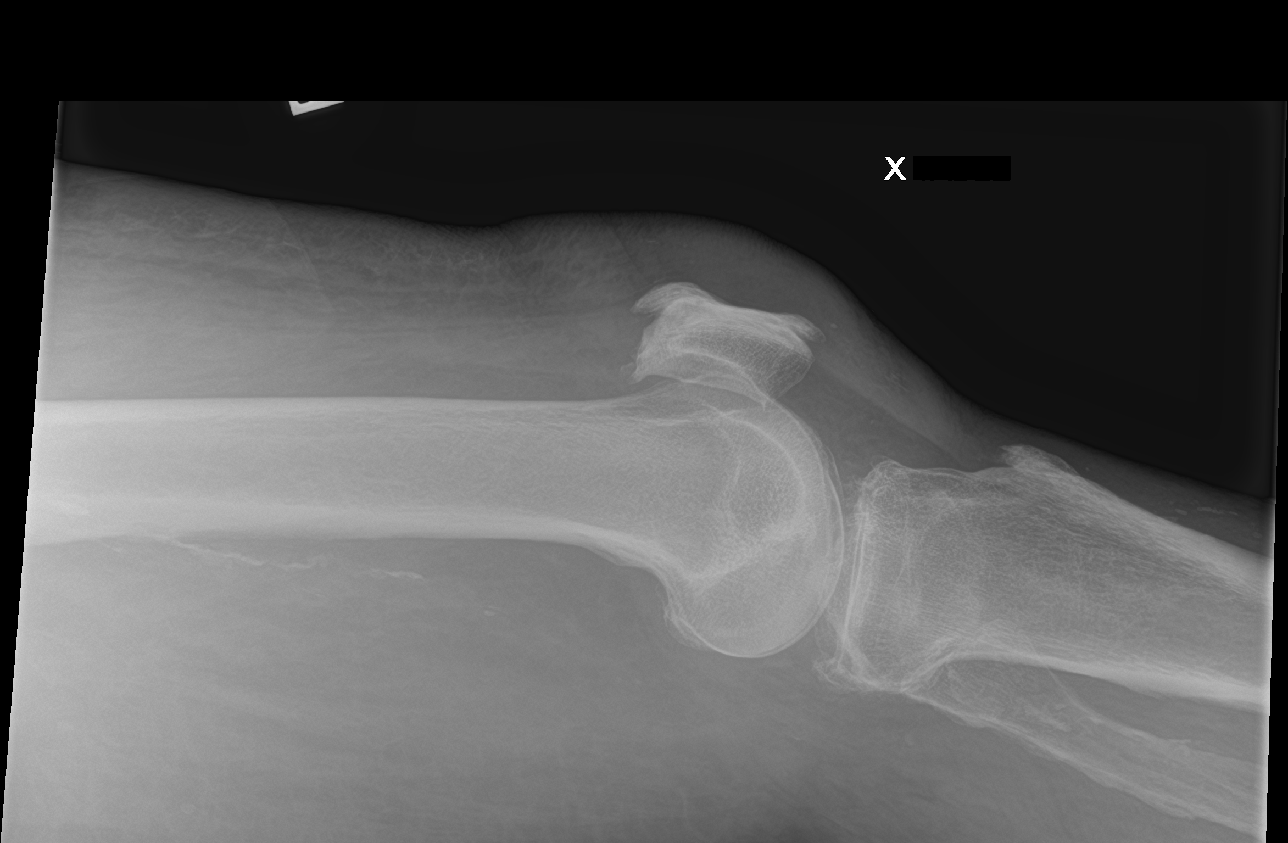

[knee obl (2 of 2)]
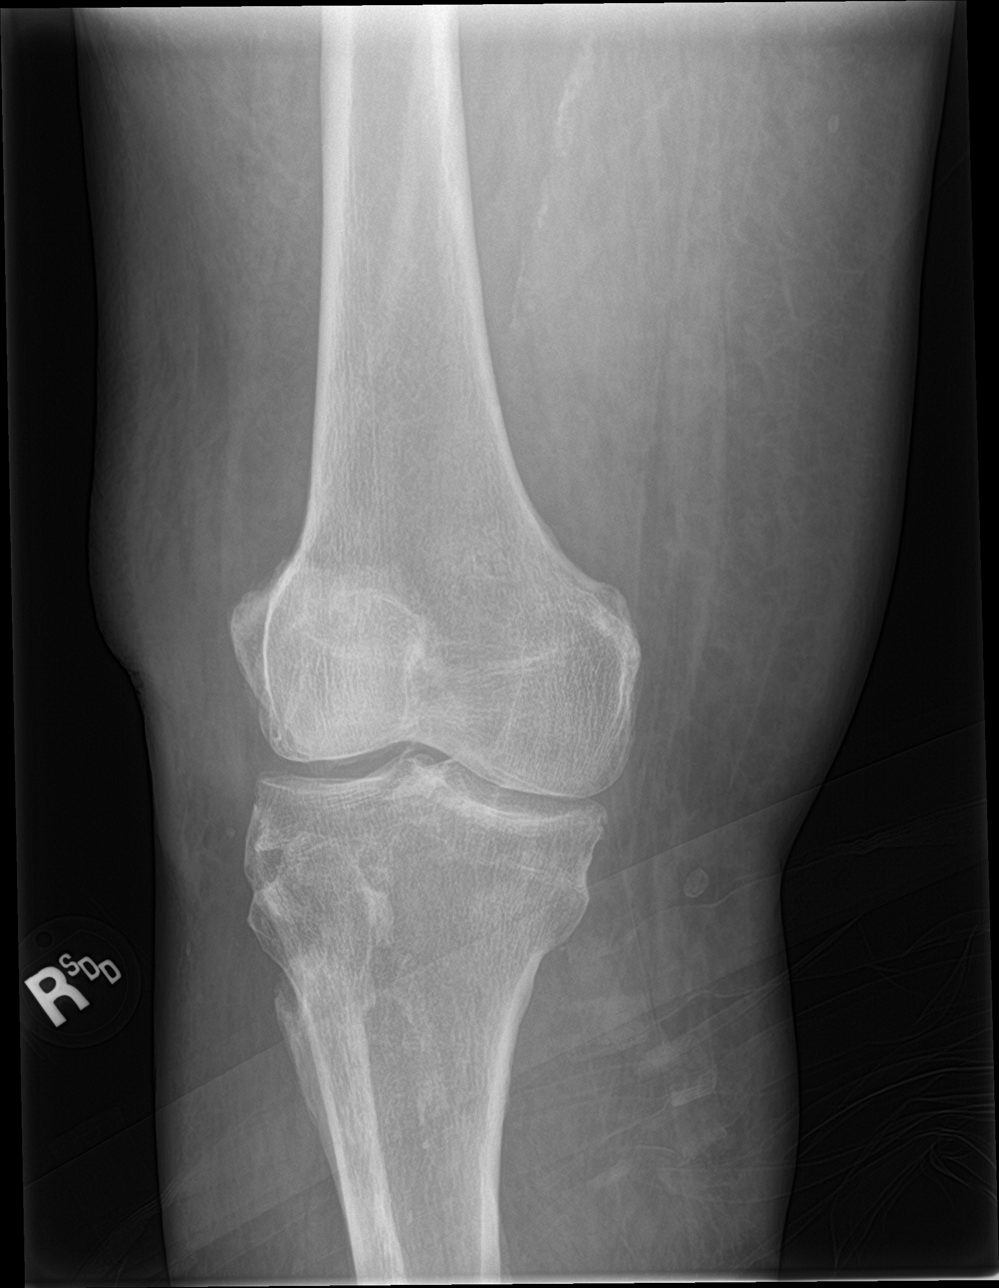

[4 of 4 positions shown; findings below may reference images not displayed]

FINDINGS: There is no acute fracture or dislocation. The bones are osteopenic.
There is mild to moderate osteoarthritic changes with
tricompartmental narrowing, meniscal chondrocalcinosis, and bone
spurring. No significant joint effusion. The soft tissues are
grossly unremarkable. Vascular calcifications noted.
IMPRESSION: 1. No acute fracture or dislocation.
2. Osteoarthritis.

## 2021-10-06 ENCOUNTER — Ambulatory Visit: Payer: Medicare Other | Admitting: Physician Assistant

## 2021-10-07 ENCOUNTER — Ambulatory Visit (INDEPENDENT_AMBULATORY_CARE_PROVIDER_SITE_OTHER): Payer: Medicare Other | Admitting: Physician Assistant

## 2021-10-07 DIAGNOSIS — R339 Retention of urine, unspecified: Secondary | ICD-10-CM | POA: Diagnosis not present

## 2021-10-07 NOTE — Progress Notes (Signed)
Cath Change/ Replacement ? ?Patient is present today for a catheter change due to urinary retention.  30m of water was removed from the balloon, a 16FR foley cath was removed with out difficulty.  Patient was cleaned and prepped in a sterile fashion with betadine. A 16 FR coude foley cath was replaced into the bladder no complications were noted.  Flushed the catheter with 560m of sterile water with 4029mrine return. The balloon was filled with 66m55m sterile water. A leg bag was attached for drainage.  A night bag was also given to the patient and patient was given instruction on how to change from one bag to another. Patient was given proper instruction on catheter care.   ? ?Performed by: KourLevi AlandA ? ?Follow up: Follow up in 1 month.  ?

## 2021-10-16 ENCOUNTER — Ambulatory Visit (HOSPITAL_COMMUNITY): Payer: Medicare Other

## 2021-10-21 ENCOUNTER — Inpatient Hospital Stay (HOSPITAL_COMMUNITY): Payer: Medicare Other

## 2021-10-21 VITALS — BP 136/59 | HR 71 | Temp 96.7°F | Resp 18

## 2021-10-21 DIAGNOSIS — E538 Deficiency of other specified B group vitamins: Secondary | ICD-10-CM | POA: Diagnosis not present

## 2021-10-21 MED ORDER — SODIUM CHLORIDE 0.9 % IV SOLN: Freq: Once | INTRAVENOUS | Status: AC

## 2021-10-21 MED ORDER — LORATADINE 10 MG PO TABS: 10.0000 mg | ORAL_TABLET | Freq: Once | ORAL | Status: DC

## 2021-10-21 MED ORDER — SODIUM CHLORIDE 0.9 % IV SOLN
300.0000 mg | Freq: Once | INTRAVENOUS | Status: AC
  Administered 2021-10-21: 300 mg via INTRAVENOUS
  Filled 2021-10-21: qty 5

## 2021-10-21 NOTE — Patient Instructions (Signed)
[facility]  Discharge Instructions: Thank you for choosing [facility] to provide your oncology and hematology care.  If you have a lab appointment with the [facility], please come in thru the Main Entrance and check in at the main information desk.  Wear comfortable clothing and clothing appropriate for easy access to any Portacath or PICC line.   We strive to give you quality time with your provider. You may need to reschedule your appointment if you arrive late (15 or more minutes).  Arriving late affects you and other patients whose appointments are after yours.  Also, if you miss three or more appointments without notifying the office, you may be dismissed from the clinic at the provider's discretion.      For prescription refill requests, have your pharmacy contact our office and allow 72 hours for refills to be complet      To help prevent nausea and vomiting after your treatment, we encourage you to take your nausea medication as directed.  BELOW ARE SYMPTOMS THAT SHOULD BE REPORTED IMMEDIATELY: *FEVER GREATER THAN 100.4 F (38 C) OR HIGHER *CHILLS OR SWEATING *NAUSEA AND VOMITING THAT IS NOT CONTROLLED WITH YOUR NAUSEA MEDICATION *UNUSUAL SHORTNESS OF BREATH *UNUSUAL BRUISING OR BLEEDING *URINARY PROBLEMS (pain or burning when urinating, or frequent urination) *BOWEL PROBLEMS (unusual diarrhea, constipation, pain near the anus) TENDERNESS IN MOUTH AND THROAT WITH OR WITHOUT PRESENCE OF ULCERS (sore throat, sores in mouth, or a toothache) UNUSUAL RASH, SWELLING OR PAIN  UNUSUAL VAGINAL DISCHARGE OR ITCHING   Items with * indicate a potential emergency and should be followed up as soon as possible or go to the Emergency Department if any problems should occur.  Please show the CHEMOTHERAPY ALERT CARD or IMMUNOTHERAPY ALERT CARD at check-in to the Emergency Department and triage nurse.  Should you have questions after your visit or need to cancel or  reschedule your appointment, please contact [facility] [phone]  and follow the prompts.  Office hours are 8:00 a.m. to 4:30 p.m. Monday - Friday. Please note that voicemails left after 4:00 p.m. may not be returned until the following business day.  We are closed weekends and major holidays. You have access to a nurse at all times for urgent questions. Please call the main number to the clinic [phone] and follow the prompts.  For any non-urgent questions, you may also contact your provider using MyChart. We now offer e-Visits for anyone 69 and older to request care online for non-urgent symptoms. For details visit [email or web address].   Also download the MyChart app! Go to the app store, search "MyChart", open the app, select [facility], and log in with your MyChart username and password.  Due to Covid, a mask is required upon entering the hospital/clinic. If you do not have a mask, one will be given to you upon arrival. For doctor visits, patients may have 1 support person aged 98 or older with them. For treatment visits, patients cannot have anyone with them due to current Covid guidelines and our immunocompromised population.

## 2021-10-21 NOTE — Progress Notes (Signed)
Iron infusion given per orders. Patient tolerated it well without problems. Vitals stable and discharged home from clinic ambulatory. Follow up as scheduled.  

## 2021-10-29 ENCOUNTER — Other Ambulatory Visit (HOSPITAL_COMMUNITY): Payer: Self-pay | Admitting: *Deleted

## 2021-10-29 ENCOUNTER — Encounter: Payer: Self-pay | Admitting: Internal Medicine

## 2021-10-29 ENCOUNTER — Telehealth: Payer: Self-pay

## 2021-10-29 ENCOUNTER — Telehealth: Payer: Self-pay | Admitting: Internal Medicine

## 2021-10-29 DIAGNOSIS — K7030 Alcoholic cirrhosis of liver without ascites: Secondary | ICD-10-CM

## 2021-10-29 NOTE — Telephone Encounter (Signed)
Letter mailed

## 2021-10-29 NOTE — Telephone Encounter (Signed)
Recall for ultrasound 

## 2021-10-29 NOTE — Telephone Encounter (Signed)
Pt has been approved for Xifaxan 550 mg OR TABS. Authorization for coverage of this drug will end on 10/25/2022. Scanned into pt's chart

## 2021-10-30 ENCOUNTER — Inpatient Hospital Stay (HOSPITAL_COMMUNITY): Payer: Medicare Other | Attending: Hematology

## 2021-10-30 ENCOUNTER — Inpatient Hospital Stay (HOSPITAL_COMMUNITY): Payer: Medicare Other

## 2021-10-30 VITALS — BP 162/75 | HR 82 | Temp 98.4°F | Resp 18

## 2021-10-30 DIAGNOSIS — E538 Deficiency of other specified B group vitamins: Secondary | ICD-10-CM | POA: Insufficient documentation

## 2021-10-30 DIAGNOSIS — D500 Iron deficiency anemia secondary to blood loss (chronic): Secondary | ICD-10-CM

## 2021-10-30 DIAGNOSIS — D649 Anemia, unspecified: Secondary | ICD-10-CM

## 2021-10-30 DIAGNOSIS — D509 Iron deficiency anemia, unspecified: Secondary | ICD-10-CM | POA: Diagnosis present

## 2021-10-30 LAB — CBC WITH DIFFERENTIAL/PLATELET
Abs Immature Granulocytes: 0.01 10*3/uL (ref 0.00–0.07)
Basophils Absolute: 0 10*3/uL (ref 0.0–0.1)
Basophils Relative: 1 %
Eosinophils Absolute: 0.1 10*3/uL (ref 0.0–0.5)
Eosinophils Relative: 3 %
HCT: 29.1 % — ABNORMAL LOW (ref 39.0–52.0)
Hemoglobin: 8.4 g/dL — ABNORMAL LOW (ref 13.0–17.0)
Immature Granulocytes: 0 %
Lymphocytes Relative: 13 %
Lymphs Abs: 0.4 10*3/uL — ABNORMAL LOW (ref 0.7–4.0)
MCH: 28.9 pg (ref 26.0–34.0)
MCHC: 28.9 g/dL — ABNORMAL LOW (ref 30.0–36.0)
MCV: 100 fL (ref 80.0–100.0)
Monocytes Absolute: 0.3 10*3/uL (ref 0.1–1.0)
Monocytes Relative: 10 %
Neutro Abs: 2.2 10*3/uL (ref 1.7–7.7)
Neutrophils Relative %: 73 %
Platelets: 117 10*3/uL — ABNORMAL LOW (ref 150–400)
RBC: 2.91 MIL/uL — ABNORMAL LOW (ref 4.22–5.81)
RDW: 17.7 % — ABNORMAL HIGH (ref 11.5–15.5)
WBC: 3 10*3/uL — ABNORMAL LOW (ref 4.0–10.5)
nRBC: 0 % (ref 0.0–0.2)

## 2021-10-30 LAB — SAMPLE TO BLOOD BANK

## 2021-10-30 MED ORDER — CYANOCOBALAMIN 1000 MCG/ML IJ SOLN
1000.0000 ug | Freq: Once | INTRAMUSCULAR | Status: AC
  Administered 2021-10-30: 1000 ug via INTRAMUSCULAR
  Filled 2021-10-30: qty 1

## 2021-10-30 MED ORDER — SODIUM CHLORIDE 0.9 % IV SOLN: Freq: Once | INTRAVENOUS | Status: AC

## 2021-10-30 MED ORDER — SODIUM CHLORIDE 0.9 % IV SOLN
300.0000 mg | Freq: Once | INTRAVENOUS | Status: AC
  Administered 2021-10-30: 300 mg via INTRAVENOUS
  Filled 2021-10-30: qty 300

## 2021-10-30 MED ORDER — LORATADINE 10 MG PO TABS
10.0000 mg | ORAL_TABLET | Freq: Once | ORAL | Status: AC
  Administered 2021-10-30: 10 mg via ORAL
  Filled 2021-10-30: qty 1

## 2021-10-30 NOTE — Progress Notes (Signed)
Pt presents today for Venofer IV iron, B12 injection,and possible blood transfusion per provider's order.  Pt's hemoglobin noted to be 8.4 today. No blood transfusion needed today. Pt advised to call the clinic if any changes occur.  Venofer 300 mg, and  B12 injection given today per MD orders. Tolerated infusion without adverse affects. Vital signs stable. No complaints at this time. Discharged from via wheelchair ambulatory in stable condition. Alert and oriented x 3. F/U with [facility] as scheduled.

## 2021-10-30 NOTE — Patient Instructions (Signed)
[facility]  Discharge Instructions: Thank you for choosing [facility] to provide your oncology and hematology care.  If you have a lab appointment with the [facility], please come in thru the Main Entrance and check in at the main information desk.  Wear comfortable clothing and clothing appropriate for easy access to any Portacath or PICC line.   We strive to give you quality time with your provider. You may need to reschedule your appointment if you arrive late (15 or more minutes).  Arriving late affects you and other patients whose appointments are after yours.  Also, if you miss three or more appointments without notifying the office, you may be dismissed from the clinic at the provider's discretion.      For prescription refill requests, have your pharmacy contact our office and allow 72 hours for refills to be completed.    Today you received Venofer IV iron infusion and B12 injection.    BELOW ARE SYMPTOMS THAT SHOULD BE REPORTED IMMEDIATELY: *FEVER GREATER THAN 100.4 F (38 C) OR HIGHER *CHILLS OR SWEATING *NAUSEA AND VOMITING THAT IS NOT CONTROLLED WITH YOUR NAUSEA MEDICATION *UNUSUAL SHORTNESS OF BREATH *UNUSUAL BRUISING OR BLEEDING *URINARY PROBLEMS (pain or burning when urinating, or frequent urination) *BOWEL PROBLEMS (unusual diarrhea, constipation, pain near the anus) TENDERNESS IN MOUTH AND THROAT WITH OR WITHOUT PRESENCE OF ULCERS (sore throat, sores in mouth, or a toothache) UNUSUAL RASH, SWELLING OR PAIN  UNUSUAL VAGINAL DISCHARGE OR ITCHING   Items with * indicate a potential emergency and should be followed up as soon as possible or go to the Emergency Department if any problems should occur.  Please show the CHEMOTHERAPY ALERT CARD or IMMUNOTHERAPY ALERT CARD at check-in to the Emergency Department and triage nurse.  Should you have questions after your visit or need to cancel or reschedule your appointment, please contact [facility] [phone]  and follow the prompts.  Office hours are 8:00 a.m. to 4:30 p.m. Monday - Friday. Please note that voicemails left after 4:00 p.m. may not be returned until the following business day.  We are closed weekends and major holidays. You have access to a nurse at all times for urgent questions. Please call the main number to the clinic [phone] and follow the prompts.  For any non-urgent questions, you may also contact your provider using MyChart. We now offer e-Visits for anyone 28 and older to request care online for non-urgent symptoms. For details visit [email or web address].   Also download the MyChart app! Go to the app store, search "MyChart", open the app, select [facility], and log in with your MyChart username and password.  Due to Covid, a mask is required upon entering the hospital/clinic. If you do not have a mask, one will be given to you upon arrival. For doctor visits, patients may have 1 support person aged 51 or older with them. For treatment visits, patients cannot have anyone with them due to current Covid guidelines and our immunocompromised population.

## 2021-11-06 ENCOUNTER — Ambulatory Visit (INDEPENDENT_AMBULATORY_CARE_PROVIDER_SITE_OTHER): Payer: Medicare Other | Admitting: Physician Assistant

## 2021-11-06 DIAGNOSIS — R339 Retention of urine, unspecified: Secondary | ICD-10-CM

## 2021-11-06 NOTE — Telephone Encounter (Signed)
Received VM to schedule [place].  Called pt, lmtcb. Mychart letter sent

## 2021-11-06 NOTE — Progress Notes (Signed)
Cath Change/ Replacement  Patient is present today for a catheter change due to urinary retention.  24m of water was removed from the balloon, a 16FR foley cath was removed with out difficulty.  Patient was cleaned and prepped in a sterile fashion with betadine. A 16 FR foley cath was replaced into the bladder no complications were noted Urine return was noted 234mand urine was yellow in color. The balloon was filled with 1029mf sterile water. A leg bag was attached for drainage.  A night bag was also given to the patient and patient was given instruction on how to change from one bag to another. Patient was given proper instruction on catheter care.    Performed by: KouLevi AlandMA  Follow up: Follow up as scheduled with MD, cath change at that apt.

## 2021-11-06 NOTE — Addendum Note (Signed)
Addended by: Cheron Every on: 11/06/2021 02:14 PM   Modules accepted: Orders

## 2021-11-18 ENCOUNTER — Ambulatory Visit: Payer: Medicare Other | Admitting: Internal Medicine

## 2021-11-20 ENCOUNTER — Ambulatory Visit (HOSPITAL_COMMUNITY)
Admission: RE | Admit: 2021-11-20 | Discharge: 2021-11-20 | Disposition: A | Discharge status: Home / Self Care | Payer: Medicare Other | Source: Ambulatory Visit | Attending: Internal Medicine | Admitting: Internal Medicine

## 2021-11-20 ENCOUNTER — Other Ambulatory Visit: Payer: Self-pay | Admitting: *Deleted

## 2021-11-20 ENCOUNTER — Other Ambulatory Visit: Payer: Self-pay | Admitting: Internal Medicine

## 2021-11-20 ENCOUNTER — Encounter (HOSPITAL_COMMUNITY): Payer: Self-pay

## 2021-11-20 ENCOUNTER — Telehealth: Payer: Self-pay | Admitting: Internal Medicine

## 2021-11-20 DIAGNOSIS — K7030 Alcoholic cirrhosis of liver without ascites: Secondary | ICD-10-CM | POA: Diagnosis present

## 2021-11-20 DIAGNOSIS — R188 Other ascites: Secondary | ICD-10-CM | POA: Diagnosis present

## 2021-11-20 NOTE — Telephone Encounter (Signed)
Pt's wife called to say they were on their way for patient to have his U/S and wanted to know if he could have fluid drawn off while they were there today. She said the hospital told her to call us to get an order. Please advise.  [phone]

## 2021-11-20 NOTE — Telephone Encounter (Signed)
Let's not do standing orders just yet. However, can have order for one time with no more than 5 liters removed. Albumin per protocol. Cell count, culture, and Cytology.

## 2021-11-20 NOTE — Progress Notes (Signed)
Patient brought to [place] Rm1 no acute distress. Placed on stretcher, prepped and draped in sterile manner. Obvious abdominal distention. Consent obtained. Access obtained at LLQ, serous drainage returned through catheter drain. Connected to suction. 2000cc collected from site. Catheter removed, dermabond placed to site. No obvious bleeding or hematoma present. No acute distress.

## 2021-11-20 NOTE — Telephone Encounter (Signed)
Called spouse. She stated they are in radiology right now and was waiting for order. I advised order already placed.

## 2021-11-20 NOTE — Telephone Encounter (Signed)
Patient no longer has standing orders for para's. Needs to be sent to provider for approval

## 2021-11-27 ENCOUNTER — Inpatient Hospital Stay (HOSPITAL_COMMUNITY): Payer: Medicare Other

## 2021-11-27 VITALS — BP 133/55 | HR 68 | Temp 97.5°F | Resp 18

## 2021-11-27 DIAGNOSIS — D649 Anemia, unspecified: Secondary | ICD-10-CM

## 2021-11-27 DIAGNOSIS — D500 Iron deficiency anemia secondary to blood loss (chronic): Secondary | ICD-10-CM

## 2021-11-27 DIAGNOSIS — D509 Iron deficiency anemia, unspecified: Secondary | ICD-10-CM | POA: Diagnosis not present

## 2021-11-27 DIAGNOSIS — E538 Deficiency of other specified B group vitamins: Secondary | ICD-10-CM

## 2021-11-27 LAB — CBC WITH DIFFERENTIAL/PLATELET
Abs Immature Granulocytes: 0.01 10*3/uL (ref 0.00–0.07)
Basophils Absolute: 0 10*3/uL (ref 0.0–0.1)
Basophils Relative: 1 %
Eosinophils Absolute: 0.1 10*3/uL (ref 0.0–0.5)
Eosinophils Relative: 3 %
HCT: 30.2 % — ABNORMAL LOW (ref 39.0–52.0)
Hemoglobin: 8.7 g/dL — ABNORMAL LOW (ref 13.0–17.0)
Immature Granulocytes: 0 %
Lymphocytes Relative: 9 %
Lymphs Abs: 0.3 10*3/uL — ABNORMAL LOW (ref 0.7–4.0)
MCH: 29.3 pg (ref 26.0–34.0)
MCHC: 28.8 g/dL — ABNORMAL LOW (ref 30.0–36.0)
MCV: 101.7 fL — ABNORMAL HIGH (ref 80.0–100.0)
Monocytes Absolute: 0.3 10*3/uL (ref 0.1–1.0)
Monocytes Relative: 9 %
Neutro Abs: 2.6 10*3/uL (ref 1.7–7.7)
Neutrophils Relative %: 78 %
Platelets: 114 10*3/uL — ABNORMAL LOW (ref 150–400)
RBC: 2.97 MIL/uL — ABNORMAL LOW (ref 4.22–5.81)
RDW: 17.8 % — ABNORMAL HIGH (ref 11.5–15.5)
WBC: 3.3 10*3/uL — ABNORMAL LOW (ref 4.0–10.5)
nRBC: 0 % (ref 0.0–0.2)

## 2021-11-27 LAB — SAMPLE TO BLOOD BANK

## 2021-11-27 MED ORDER — SODIUM CHLORIDE 0.9 % IV SOLN: Freq: Once | INTRAVENOUS | Status: AC

## 2021-11-27 MED ORDER — LORATADINE 10 MG PO TABS
10.0000 mg | ORAL_TABLET | Freq: Once | ORAL | Status: AC
  Administered 2021-11-27: 10 mg via ORAL
  Filled 2021-11-27: qty 1

## 2021-11-27 MED ORDER — SODIUM CHLORIDE 0.9 % IV SOLN
300.0000 mg | Freq: Once | INTRAVENOUS | Status: AC
  Administered 2021-11-27: 300 mg via INTRAVENOUS
  Filled 2021-11-27: qty 300

## 2021-11-27 MED ORDER — CYANOCOBALAMIN 1000 MCG/ML IJ SOLN
1000.0000 ug | Freq: Once | INTRAMUSCULAR | Status: AC
  Administered 2021-11-27: 1000 ug via INTRAMUSCULAR
  Filled 2021-11-27: qty 1

## 2021-11-27 NOTE — Patient Instructions (Signed)
  Jeffrey [name]  Discharge Instructions: Thank you for choosing [facility] to provide your oncology and hematology care.  If you have a lab appointment with the [facility], please come in thru the Main Entrance and check in at the main information desk.  Wear comfortable clothing and clothing appropriate for easy access to any Portacath or PICC line.   We strive to give you quality time with your provider. You may need to reschedule your appointment if you arrive late (15 or more minutes).  Arriving late affects you and other patients whose appointments are after yours.  Also, if you miss three or more appointments without notifying the office, you may be dismissed from the clinic at the provider's discretion.      For prescription refill requests, have your pharmacy contact our office and allow 72 hours for refills to be completed.    Today you received the following chemotherapy and/or immunotherapy agents Venofer IV iron and B12 injection.   To help prevent nausea and vomiting after your treatment, we encourage you to take your nausea medication as directed.  BELOW ARE SYMPTOMS THAT SHOULD BE REPORTED IMMEDIATELY: *FEVER GREATER THAN 100.4 F (38 C) OR HIGHER *CHILLS OR SWEATING *NAUSEA AND VOMITING THAT IS NOT CONTROLLED WITH YOUR NAUSEA MEDICATION *UNUSUAL SHORTNESS OF BREATH *UNUSUAL BRUISING OR BLEEDING *URINARY PROBLEMS (pain or burning when urinating, or frequent urination) *BOWEL PROBLEMS (unusual diarrhea, constipation, pain near the anus) TENDERNESS IN MOUTH AND THROAT WITH OR WITHOUT PRESENCE OF ULCERS (sore throat, sores in mouth, or a toothache) UNUSUAL RASH, SWELLING OR PAIN  UNUSUAL VAGINAL DISCHARGE OR ITCHING   Items with * indicate a potential emergency and should be followed up as soon as possible or go to the Emergency Department if any problems should occur.  Please show the CHEMOTHERAPY ALERT CARD or IMMUNOTHERAPY ALERT CARD at check-in  to the Emergency Department and triage nurse.  Should you have questions after your visit or need to cancel or reschedule your appointment, please contact [facility] [phone]  and follow the prompts.  Office hours are 8:00 a.m. to 4:30 p.m. Monday - Friday. Please note that voicemails left after 4:00 p.m. may not be returned until the following business day.  We are closed weekends and major holidays. You have access to a nurse at all times for urgent questions. Please call the main number to the clinic [phone] and follow the prompts.  For any non-urgent questions, you may also contact your provider using MyChart. We now offer e-Visits for anyone 9 and older to request care online for non-urgent symptoms. For details visit [email or web address].   Also download the MyChart app! Go to the app store, search "MyChart", open the app, select [facility], and log in with your MyChart username and password.  Masks are optional in the cancer centers. If you would like for your care team to wear a mask while they are taking care of you, please let them know. For doctor visits, patients may have with them one support person who is at least 75 years old. At this time, visitors are not allowed in the infusion area.

## 2021-11-27 NOTE — Progress Notes (Signed)
Pt presents  today for possible blood, Venofer IV iron infusion, and B12 injection per provider's order. Pt's hemoglobin noted to be 8.7 today. Pt denies, weakness, shortness of breath, and fatigue. No blood needed today.  Peripheral IV started with good blood return pre and post infusion.  Venofer 300 mg and B12 injection given today per MD orders. Tolerated infusion without adverse affects. Vital signs stable. No complaints at this time. Discharged from clinic ambulatory in stable condition. Alert and oriented x 3. F/U with [facility] as scheduled.

## 2021-12-08 NOTE — Progress Notes (Signed)
H&P  Chief Complaint: Urinary retention  History of Present Illness: 76 year old male with multiple medical problems, end-stage renal disease, on chronic hemodialysis with urinary obstruction/incomplete emptying.  He has had an indwelling Foley cath for more than 2 years.  Does not want to have a further voiding trials.  He comes in for monthly catheter changes  He has had no catheter related issues or infections since his visit here last year.    Past Medical History:  Diagnosis Date   A-fib [facility])    when initially stating dialysis january 2021 at [facility] per spouse; never heard anything else about it   Alcoholic cirrhosis [facility])    patient reports completing hep a and b vaccines in 2006   Anemia    has had 3 units of prbcs 2013   Anxiety    Arthritis    Asymptomatic gallstones    Ultrasound in 2006   B12 deficiency 11/21/2020   C. difficile colitis    Cholelithiasis    Chronic kidney disease    Dialysis M/W/F/Sa   Depression    Diabetes ([place])    Duodenal ulcer with hemorrhage    per patient in 2006 or 2007, records have been requested   Dyspnea    low oxygen level -  has O2 2 L all day   Esophageal varices ([place])    see [facility]   GERD (gastroesophageal reflux disease)    Heart burn    History of alcohol abuse    quit 05/2013   HOH (hard of hearing)    Hypertension    Liver cirrhosis ([place])    ETOH   SBP (spontaneous bacterial peritonitis) ([place])    2020, November 2021.    Splenomegaly    Ultrasound in 2006    Past Surgical History:  Procedure Laterality Date   AGILE CAPSULE N/A 09/20/2019   Procedure: AGILE CAPSULE;  Surgeon: Daneil Dolin, MD;  [facility]: [facility];  Service: Endoscopy;  Laterality: N/A;   APPENDECTOMY     AV FISTULA PLACEMENT Left 08/17/2019   Procedure: ARTERIOVENOUS (AV) FISTULA CREATION VERSES ARTERIOVENOUS GRAFT;  Surgeon: [name], MD;  Location: [facility] OR;  Service: Vascular;  Laterality: Left;   BASCILIC VEIN TRANSPOSITION  Left 09/28/2019   Procedure: LEFT SECOND STAGE [place];  Surgeon: [name], MD;  Location: [facility];  Service: Vascular;  Laterality: Left;   BIOPSY  02/03/2018   Procedure: BIOPSY;  Surgeon: Daneil Dolin, MD;  [facility];  Service: Endoscopy;;  bx of gastric polyps   BIOPSY  09/19/2019   Procedure: BIOPSY;  Surgeon: Danie Binder, MD;  [facility];  Service: Endoscopy;;   BIOPSY  01/16/2021   Procedure: BIOPSY;  Surgeon: Daneil Dolin, MD;  [facility];  Service: Endoscopy;;   CATARACT EXTRACTION Right    COLONOSCOPY  03/10/2005   Rectal polyp as described above, removed with snare. Left sided  diverticula. The remainder of the colonic mucosa appeared normal. Inflammed polyp on path.   COLONOSCOPY  04/1999   Dr. Thea Silversmith polyps removed,  Path showed hyperplastic   COLONOSCOPY  10/2015   Dr. Britta Mccreedy: diverticulosis, single sessile tubular adenoma 3-11m in size removed from descending colon.    COLONOSCOPY WITH ESOPHAGOGASTRODUODENOSCOPY (EGD)  03/31/2012   RNFA:[id number]. Colonic diverticulosis. tubular adenoma colon   COLONOSCOPY WITH ESOPHAGOGASTRODUODENOSCOPY (EGD)  06/2019   FORSYTH: small esophageal varices without high risk stigmata, single large AVM on lesser curvature  in gastric body s/p APC ablation, gastric antral and duodenal bulb polyposis. No significant source to explain transfusion dependent anemia. Colonoscopy with portal colopathy and diffuse edema, changes of Cdiff colitis on colonoscopy   COLONOSCOPY WITH PROPOFOL N/A 01/19/2021   Surgeon: [name], MD; 15 mm polyp in descending colon removed piecemeal, 6 mm polyp in ascending colon and 12 mm polyp in sigmoid colon not removed due to high bleeding risk, benign polypoid lesion at splenic flexure, diverticulosis in sigmoid and descending colon, rectal varices.   COLONOSCOPY WITH PROPOFOL N/A 01/16/2021   Surgeon: Daneil Dolin, MD;  Portal  colopathy, large rectal varices, polypoid mass at splenic flexure biopsy, two 1 cm left colon polyps not removed, left-sided diverticulosis.  Reported patient had bleeding from splenic flexure lesion.  Pathology with acutely inflamed colonic mucosa with localized ulceration and prominent reactive changes.   ESOPHAGEAL BANDING N/A 02/03/2018   Procedure: ESOPHAGEAL BANDING;  Surgeon: Daneil Dolin, MD;  [facility];  Service: Endoscopy;  Laterality: N/A;   ESOPHAGEAL BANDING  01/16/2021   Procedure: ESOPHAGEAL BANDING;  Surgeon: Daneil Dolin, MD;  [facility];  Service: Endoscopy;;   ESOPHAGOGASTRODUODENOSCOPY  01/15/2005   Three columns grade 1 to 2 esophageal varices, otherwise normal esophageal mucosa.  Esophagus was not manipulated otherwise./Nodularity of the antrum with overlying erosions, nonspecific finding. Path showed rare H.pylori   ESOPHAGOGASTRODUODENOSCOPY  09/2008   Dr. Gaylene Brooks columns of grade 2-3 esoph varices, only one column was prominent. Portal gastropathy, multiple gastrc polyps at antrum, two were 2cm with black eschar, bulbar polyps, bulbar erosions   ESOPHAGOGASTRODUODENOSCOPY  11/2004   Dr. Brantley Stage 3 esoph varices   ESOPHAGOGASTRODUODENOSCOPY  03/31/2012   RMR: 4 columns(3-GR 2, 1-GR1) non-bleeding esophageal varices, portal gastropathy, small HH, early GAVE, multiple gastric polyps    ESOPHAGOGASTRODUODENOSCOPY (EGD) WITH PROPOFOL N/A 02/03/2018   Dr. Gala Romney: Esophageal varices, 3 columns grade 1-2.  Portal hypertensive gastropathy.  Multiple gastric polyps, biopsy consistent with hyperplastic.   ESOPHAGOGASTRODUODENOSCOPY (EGD) WITH PROPOFOL N/A 09/19/2019   Fields: grade I and II esophageal varcies, mild portal hypertensive gastropathy, moderate gastritis but no H. pylori, single hyperplastic gastric polyp removed, obvious source for melena/transfusion dependent anemia not identified, may be due to friable gastric and duodenal  mucosa in the setting of portal hypertension   ESOPHAGOGASTRODUODENOSCOPY (EGD) WITH PROPOFOL N/A 01/16/2021   Surgeon: Daneil Dolin, MD; Portal hypertensive gastropathy, grade 2 esophageal varices with bleeding stigmata s/p banding x4, slight nodularity and congestion involving bulb and second portion of duodenum, nonspecific.  Recommended repeat EGD in 4 weeks.   ESOPHAGOGASTRODUODENOSCOPY (EGD) WITH PROPOFOL N/A 05/22/2021   stigmata of prior esophageal banding and no significant varices seen. Portal gastropathy. Polypoid gastric and duodenal mucosa with overlying erosions, marked mucosal friability.   GASTRIC VARICES BANDING  03/31/2012   Procedure: GASTRIC VARICES BANDING;  Surgeon: Daneil Dolin, MD;  [facility];  Service: Endoscopy;  Laterality: N/A;   GIVENS CAPSULE STUDY N/A 09/21/2019   Poor study with a lot of debris obstructing much of the view of the first approximate 3 hours out of 6 hours.  No obvious source of bleeding identified.  Sequela of bleeding and old blood seen in the form of"whisps" of blood, blood flecks mostly toward the end of the study.   HEMOSTASIS CLIP PLACEMENT  01/19/2021   Procedure: HEMOSTASIS CLIP PLACEMENT;  Surgeon: [name], MD;  Location: [facility] ENDOSCOPY;  Service: Gastroenterology;;   IR REMOVAL  TUN CV CATH W/O FL  02/15/2020   POLYPECTOMY  09/19/2019   Procedure: POLYPECTOMY;  Surgeon: Danie Binder, MD;  [facility];  Service: Endoscopy;;   POLYPECTOMY  01/19/2021   Procedure: POLYPECTOMY;  Surgeon: [name], MD;  Location: [facility] ENDOSCOPY;  Service: Gastroenterology;;   UMBILICAL HERNIA REPAIR  2017   exploratory laparotomy, abdominal washout    Home Medications:  Allergies as of 12/09/2021       Reactions   Tape Itching, Rash, Other (See Comments)   Tears skin        Medication List        Accurate as of December 08, 2021  7:34 PM. If you have any questions, ask your nurse or doctor.           acetaminophen 650 MG CR tablet Commonly known as: TYLENOL Take 650 mg by mouth every 8 (eight) hours as needed for pain.   Alcohol Prep 70 % Pads Apply 1 each topically 3 (three) times daily.   Alka-Seltzer Heartburn + Gas 750-80 MG Chew Generic drug: Calcium Carbonate-Simethicone Chew 1-2 tablets by mouth 2 (two) times daily as needed (acid reflux).   ALPRAZolam 0.5 MG tablet Commonly known as: XANAX Take 0.5 mg by mouth See admin instructions. Take one tablet (0.5 mg) by mouth on Monday, Wednesday, Friday before dialysis, may also take one tablet (0.5 mg) daily as needed for anxiety.   BD Pen Needle Micro U/F 32G X 6 MM Misc Generic drug: Insulin Pen Needle SMARTSIG:SUB-Q Daily PRN   calcium carbonate 500 MG chewable tablet Commonly known as: TUMS - dosed in mg elemental calcium Chew 1 tablet by mouth 3 (three) times daily with meals.   cetirizine 10 MG tablet Commonly known as: ZYRTEC Take 10 mg by mouth daily.   cholecalciferol 25 MCG (1000 UNIT) tablet Commonly known as: VITAMIN D3 Take 1,000 Units by mouth daily.   cloNIDine 0.1 MG tablet Commonly known as: CATAPRES Take 0.1 mg by mouth 2 (two) times daily. Take if blood pressure will not go down   Constulose 10 GM/15ML solution Generic drug: lactulose Take by mouth.   diltiazem 120 MG 24 hr capsule Commonly known as: TIAZAC Take 120 mg by mouth daily.   diltiazem 120 MG 24 hr capsule Commonly known as: DILACOR XR Take 120 mg by mouth daily.   famotidine 20 MG tablet Commonly known as: Pepcid Take 1 tablet (20 mg total) by mouth 2 (two) times daily.   FreeStyle Libre 2 Sensor Misc Apply topically.   furosemide 40 MG tablet Commonly known as: LASIX Take 80 mg by mouth daily.   gabapentin 100 MG capsule Commonly known as: NEURONTIN TAKE 1 CAPSULE BY MOUTH AT BEDTIME What changed: when to take this   Garlic 3419 MG Caps Take 1,000 mg by mouth daily.   GlucaGen HypoKit 1 MG Solr  injection Generic drug: glucagon Inject 1 mg into the muscle once as needed for low blood sugar.   HumaLOG KwikPen 100 UNIT/ML KwikPen Generic drug: insulin lispro 10-50 Units 3 (three) times daily. Sliding scale   hydrOXYzine 25 MG tablet Commonly known as: ATARAX Take 25 mg by mouth 3 (three) times daily. For itching   iron polysaccharides 150 MG capsule Commonly known as: NIFEREX Take 150 mg by mouth daily with supper.   levofloxacin 500 MG tablet Commonly known as: LEVAQUIN Take 500 mg by mouth 3 (three) times a week.   lidocaine-prilocaine cream Commonly known as: EMLA Apply  2 application topically daily.   metoprolol succinate 25 MG 24 hr tablet Commonly known as: TOPROL-XL Take 1 tablet (25 mg total) by mouth See admin instructions. Take one tablet (25 mg) by mouth at bedtime every night and take one tablet (25 mg) in morning on  Sun, Tues, Thurs and Sat (non-dialysis days)   midodrine 10 MG tablet Commonly known as: PROAMATINE Take by mouth.   mirtazapine 30 MG tablet Commonly known as: REMERON Take 1 tablet (30 mg total) by mouth at bedtime. What changed: when to take this   multivitamin with minerals Tabs tablet Take 1 tablet by mouth in the morning.   omega-3 acid ethyl esters 1 g capsule Commonly known as: LOVAZA Take 2 g by mouth 2 (two) times daily.   ondansetron 4 MG tablet Commonly known as: ZOFRAN Take 4 mg by mouth every 8 (eight) hours as needed for nausea or vomiting.   oxyCODONE-acetaminophen 5-325 MG tablet Commonly known as: PERCOCET/ROXICET Take 1 tablet by mouth every 4 (four) hours as needed for moderate pain.   pantoprazole 40 MG tablet Commonly known as: PROTONIX Take 40 mg by mouth daily.   pravastatin 20 MG tablet Commonly known as: PRAVACHOL Take 20 mg by mouth in the morning.   PROBIOTIC DAILY PO Take 1 tablet by mouth in the morning.   spironolactone 50 MG tablet Commonly known as: ALDACTONE Take 50 mg by mouth in the  morning.   sulfamethoxazole-trimethoprim 800-160 MG tablet Commonly known as: BACTRIM DS Take 1 tablet by mouth once a week. Take every Friday after dialysis.   thiamine 100 MG tablet Commonly known as: Vitamin B-1 Take 100 mg by mouth daily.   tiZANidine 2 MG tablet Commonly known as: ZANAFLEX Take 2 mg by mouth 2 (two) times daily.   [name] Generic drug: insulin degludec Inject 120 Units into the skin at bedtime.   TRI-VIT/FLUORIDE/IRON PO Take 1 tablet by mouth daily.   triamcinolone cream 0.1 % Commonly known as: KENALOG Apply 1 application topically 2 (two) times daily as needed (dry skin Itching).   venlafaxine 75 MG tablet Commonly known as: EFFEXOR Take 75 mg by mouth 3 (three) times daily with meals.   vitamin C 1000 MG tablet Take 1,000 mg by mouth in the morning.   Xifaxan 550 MG Tabs tablet Generic drug: rifaximin TAKE 1 TABLET BY MOUTH TWICE DAILY What changed: how much to take        Allergies:  Allergies  Allergen Reactions   Tape Itching, Rash and Other (See Comments)    Tears skin    Family History  Problem Relation Age of Onset   Colon cancer Father 23       deceased age 15   Breast cancer Sister        deceased   Diabetes Sister    Stroke Mother    Healthy Son    Healthy Daughter    Lung cancer Neg Hx    Ovarian cancer Neg Hx     Social History:  reports that he quit smoking about 35 years ago. His smoking use included cigarettes. He has a 50.00 pack-year smoking history. He has never used smokeless tobacco. He reports that he does not drink alcohol and does not use drugs.  ROS: A complete review of systems was performed.  All systems are negative except for pertinent findings as noted.  Physical Exam:  Vital signs in last 24 hours: There were no vitals taken  for this visit. 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  He has oliguria, approximately 5 to 600 mL of urine today.  Foley catheter drainage  Plan:  I have discussed the fact that be worthwhile trying a catheter plug use with the patient and his wife and that showed a catheter plug  I will see him back in a year  If he desires to use a plug he will let the nurses know during one of his catheter changes

## 2021-12-09 ENCOUNTER — Encounter: Payer: Self-pay | Admitting: Urology

## 2021-12-09 ENCOUNTER — Ambulatory Visit: Payer: Medicare Other | Admitting: Urology

## 2021-12-09 VITALS — BP 128/63 | HR 74

## 2021-12-09 DIAGNOSIS — N401 Enlarged prostate with lower urinary tract symptoms: Secondary | ICD-10-CM | POA: Diagnosis not present

## 2021-12-09 DIAGNOSIS — N138 Other obstructive and reflux uropathy: Secondary | ICD-10-CM

## 2021-12-09 DIAGNOSIS — R339 Retention of urine, unspecified: Secondary | ICD-10-CM

## 2021-12-09 NOTE — Progress Notes (Signed)
Cath Change/ Replacement  Patient is present today for a catheter change due to urinary retention.  50m of water was removed from the balloon, a 16FR foley cath was removed with out difficulty.  Patient was cleaned and prepped in a sterile fashion with betadine. A 16 FR foley cath was replaced into the bladder no complications were noted Urine return was noted 620mand urine was yellow in color. The balloon was filled with 1058mf sterile water. A leg bag was attached for drainage.   Patient was given proper instruction on catheter care.    Performed by: Estephanie Hubbs LPn  Follow up: 1 month [place]

## 2021-12-16 ENCOUNTER — Ambulatory Visit: Payer: Medicare Other | Admitting: Internal Medicine

## 2021-12-18 ENCOUNTER — Other Ambulatory Visit: Payer: Self-pay | Admitting: *Deleted

## 2021-12-18 NOTE — Patient Outreach (Signed)
[facility]) Care Management  12/18/2021  Jeffrey [name] [birth year] [id number]   RN Health Coach case closure. Patient not eligible for benefit.  [name] Management [phone]

## 2021-12-30 ENCOUNTER — Inpatient Hospital Stay: Payer: Medicare Other

## 2021-12-30 ENCOUNTER — Inpatient Hospital Stay (HOSPITAL_COMMUNITY): Payer: Medicare Other

## 2021-12-30 ENCOUNTER — Inpatient Hospital Stay: Payer: Medicare Other | Attending: Hematology

## 2021-12-30 ENCOUNTER — Inpatient Hospital Stay (HOSPITAL_COMMUNITY): Payer: Medicare Other | Admitting: Physician Assistant

## 2021-12-30 VITALS — BP 122/61 | HR 75 | Temp 96.7°F | Resp 20

## 2021-12-30 DIAGNOSIS — D509 Iron deficiency anemia, unspecified: Secondary | ICD-10-CM | POA: Insufficient documentation

## 2021-12-30 DIAGNOSIS — Z87891 Personal history of nicotine dependence: Secondary | ICD-10-CM | POA: Diagnosis not present

## 2021-12-30 DIAGNOSIS — E538 Deficiency of other specified B group vitamins: Secondary | ICD-10-CM | POA: Diagnosis not present

## 2021-12-30 DIAGNOSIS — D61818 Other pancytopenia: Secondary | ICD-10-CM | POA: Insufficient documentation

## 2021-12-30 DIAGNOSIS — K7210 Chronic hepatic failure without coma: Secondary | ICD-10-CM | POA: Insufficient documentation

## 2021-12-30 DIAGNOSIS — D6959 Other secondary thrombocytopenia: Secondary | ICD-10-CM | POA: Diagnosis not present

## 2021-12-30 DIAGNOSIS — R634 Abnormal weight loss: Secondary | ICD-10-CM | POA: Insufficient documentation

## 2021-12-30 DIAGNOSIS — N186 End stage renal disease: Secondary | ICD-10-CM | POA: Diagnosis not present

## 2021-12-30 DIAGNOSIS — D539 Nutritional anemia, unspecified: Secondary | ICD-10-CM | POA: Insufficient documentation

## 2021-12-30 DIAGNOSIS — R161 Splenomegaly, not elsewhere classified: Secondary | ICD-10-CM | POA: Diagnosis not present

## 2021-12-30 DIAGNOSIS — Z992 Dependence on renal dialysis: Secondary | ICD-10-CM | POA: Diagnosis not present

## 2021-12-30 DIAGNOSIS — Z800 Family history of malignant neoplasm of digestive organs: Secondary | ICD-10-CM | POA: Insufficient documentation

## 2021-12-30 DIAGNOSIS — K922 Gastrointestinal hemorrhage, unspecified: Secondary | ICD-10-CM | POA: Insufficient documentation

## 2021-12-30 DIAGNOSIS — N189 Chronic kidney disease, unspecified: Secondary | ICD-10-CM | POA: Diagnosis not present

## 2021-12-30 DIAGNOSIS — D649 Anemia, unspecified: Secondary | ICD-10-CM

## 2021-12-30 DIAGNOSIS — K7460 Unspecified cirrhosis of liver: Secondary | ICD-10-CM | POA: Diagnosis not present

## 2021-12-30 DIAGNOSIS — Z803 Family history of malignant neoplasm of breast: Secondary | ICD-10-CM | POA: Insufficient documentation

## 2021-12-30 DIAGNOSIS — D500 Iron deficiency anemia secondary to blood loss (chronic): Secondary | ICD-10-CM

## 2021-12-30 DIAGNOSIS — D631 Anemia in chronic kidney disease: Secondary | ICD-10-CM | POA: Insufficient documentation

## 2021-12-30 LAB — CBC WITH DIFFERENTIAL/PLATELET
Abs Immature Granulocytes: 0.01 10*3/uL (ref 0.00–0.07)
Basophils Absolute: 0 10*3/uL (ref 0.0–0.1)
Basophils Relative: 1 %
Eosinophils Absolute: 0 10*3/uL (ref 0.0–0.5)
Eosinophils Relative: 1 %
HCT: 27 % — ABNORMAL LOW (ref 39.0–52.0)
Hemoglobin: 8.1 g/dL — ABNORMAL LOW (ref 13.0–17.0)
Immature Granulocytes: 0 %
Lymphocytes Relative: 6 %
Lymphs Abs: 0.2 10*3/uL — ABNORMAL LOW (ref 0.7–4.0)
MCH: 30.9 pg (ref 26.0–34.0)
MCHC: 30 g/dL (ref 30.0–36.0)
MCV: 103.1 fL — ABNORMAL HIGH (ref 80.0–100.0)
Monocytes Absolute: 0.2 10*3/uL (ref 0.1–1.0)
Monocytes Relative: 6 %
Neutro Abs: 2.3 10*3/uL (ref 1.7–7.7)
Neutrophils Relative %: 86 %
Platelets: 91 10*3/uL — ABNORMAL LOW (ref 150–400)
RBC: 2.62 MIL/uL — ABNORMAL LOW (ref 4.22–5.81)
RDW: 17.9 % — ABNORMAL HIGH (ref 11.5–15.5)
WBC: 2.7 10*3/uL — ABNORMAL LOW (ref 4.0–10.5)
nRBC: 0 % (ref 0.0–0.2)

## 2021-12-30 LAB — IRON AND TIBC
Iron: 57 ug/dL (ref 45–182)
Saturation Ratios: 20 % (ref 17.9–39.5)
TIBC: 285 ug/dL (ref 250–450)
UIBC: 228 ug/dL

## 2021-12-30 LAB — SAMPLE TO BLOOD BANK

## 2021-12-30 LAB — VITAMIN B12: Vitamin B-12: 991 pg/mL — ABNORMAL HIGH (ref 180–914)

## 2021-12-30 LAB — FERRITIN: Ferritin: 182 ng/mL (ref 24–336)

## 2021-12-30 MED ORDER — CYANOCOBALAMIN 1000 MCG/ML IJ SOLN
1000.0000 ug | Freq: Once | INTRAMUSCULAR | Status: AC
  Administered 2021-12-30: 1000 ug via INTRAMUSCULAR
  Filled 2021-12-30: qty 1

## 2021-12-30 NOTE — Progress Notes (Signed)
Hemoglobin today is 8.1. no need for blood transfusion today.

## 2021-12-30 NOTE — Progress Notes (Signed)
Patient tolerated injection with no complaints voiced.  Site clean and dry with no bruising or swelling noted at site.  See MAR for details.  Band aid applied.  Patient stable during and after injection.  Vss with discharge and left in satisfactory condition with no s/s of distress noted.  

## 2021-12-30 NOTE — Patient Instructions (Signed)
[place]  Discharge Instructions: Thank you for choosing [facility] to provide your oncology and hematology care.  If you have a lab appointment with the [facility], please come in thru the Main Entrance and check in at the main information desk.  Wear comfortable clothing and clothing appropriate for easy access to any Portacath or PICC line.   We strive to give you quality time with your provider. You may need to reschedule your appointment if you arrive late (15 or more minutes).  Arriving late affects you and other patients whose appointments are after yours.  Also, if you miss three or more appointments without notifying the office, you may be dismissed from the clinic at the provider's discretion.      For prescription refill requests, have your pharmacy contact our office and allow 72 hours for refills to be completed.    Today you received the following chemotherapy and/or immunotherapy agents vitamin b12.  Vitamin B12 Injection What is this medication? Vitamin B12 (VAHY tuh min B12) prevents and treats low vitamin B12 levels in your body. It is used in people who do not get enough vitamin B12 from their diet or when their digestive tract does not absorb enough. Vitamin B12 plays an important role in maintaining the health of your nervous system and red blood cells. This medicine may be used for other purposes; ask your health care provider or pharmacist if you have questions. COMMON BRAND NAME(S): B-12 Compliance Kit, B-12 Injection Kit, Cyomin, Dodex, LA-12, Nutri-Twelve, Physicians EZ Use B-12, Primabalt What should I tell my care team before I take this medication? They need to know if you have any of these conditions: Kidney disease Leber's disease Megaloblastic anemia An unusual or allergic reaction to cyanocobalamin, cobalt, other medications, foods, dyes, or preservatives Pregnant or trying to get pregnant Breast-feeding How should I  use this medication? This medication is injected into a muscle or deeply under the skin. It is usually given in a clinic or care team's office. However, your care team may teach you how to inject yourself. Follow all instructions. Talk to your care team about the use of this medication in children. Special care may be needed. Overdosage: If you think you have taken too much of this medicine contact a poison control center or emergency room at once. NOTE: This medicine is only for you. Do not share this medicine with others. What if I miss a dose? If you are given your dose at a clinic or care team's office, call to reschedule your appointment. If you give your own injections, and you miss a dose, take it as soon as you can. If it is almost time for your next dose, take only that dose. Do not take double or extra doses. What may interact with this medication? Alcohol Colchicine This list may not describe all possible interactions. Give your health care provider a list of all the medicines, herbs, non-prescription drugs, or dietary supplements you use. Also tell them if you smoke, drink alcohol, or use illegal drugs. Some items may interact with your medicine. What should I watch for while using this medication? Visit your care team regularly. You may need blood work done while you are taking this medication. You may need to follow a special diet. Talk to your care team. Limit your alcohol intake and avoid smoking to get the best benefit. What side effects may I notice from receiving this medication? Side effects that you should  report to your care team as soon as possible: Allergic reactions--skin rash, itching, hives, swelling of the face, lips, tongue, or throat Swelling of the ankles, hands, or feet Trouble breathing Side effects that usually do not require medical attention (report to your care team if they continue or are bothersome): Diarrhea This list may not describe all possible side  effects. Call your doctor for medical advice about side effects. You may report side effects to FDA at 1-800-FDA-1088. Where should I keep my medication? Keep out of the reach of children. Store at room temperature between 15 and 30 degrees C (59 and 85 degrees F). Protect from light. Throw away any unused medication after the expiration date. NOTE: This sheet is a summary. It may not cover all possible information. If you have questions about this medicine, talk to your doctor, pharmacist, or health care provider.  2023 Elsevier/Gold Standard (2021-01-28 00:00:00)       To help prevent nausea and vomiting after your treatment, we encourage you to take your nausea medication as directed.  BELOW ARE SYMPTOMS THAT SHOULD BE REPORTED IMMEDIATELY: *FEVER GREATER THAN 100.4 F (38 C) OR HIGHER *CHILLS OR SWEATING *NAUSEA AND VOMITING THAT IS NOT CONTROLLED WITH YOUR NAUSEA MEDICATION *UNUSUAL SHORTNESS OF BREATH *UNUSUAL BRUISING OR BLEEDING *URINARY PROBLEMS (pain or burning when urinating, or frequent urination) *BOWEL PROBLEMS (unusual diarrhea, constipation, pain near the anus) TENDERNESS IN MOUTH AND THROAT WITH OR WITHOUT PRESENCE OF ULCERS (sore throat, sores in mouth, or a toothache) UNUSUAL RASH, SWELLING OR PAIN  UNUSUAL VAGINAL DISCHARGE OR ITCHING   Items with * indicate a potential emergency and should be followed up as soon as possible or go to the Emergency Department if any problems should occur.  Please show the CHEMOTHERAPY ALERT CARD or IMMUNOTHERAPY ALERT CARD at check-in to the Emergency Department and triage nurse.  Should you have questions after your visit or need to cancel or reschedule your appointment, please contact [facility] [phone]  and follow the prompts.  Office hours are 8:00 a.m. to 4:30 p.m. Monday - Friday. Please note that voicemails left after 4:00 p.m. may not be returned until the following business day.  We are closed  weekends and major holidays. You have access to a nurse at all times for urgent questions. Please call the main number to the clinic [phone] and follow the prompts.  For any non-urgent questions, you may also contact your provider using MyChart. We now offer e-Visits for anyone 83 and older to request care online for non-urgent symptoms. For details visit [email or web address].   Also download the MyChart app! Go to the app store, search "MyChart", open the app, select [facility], and log in with your MyChart username and password.  Masks are optional in the cancer centers. If you would like for your care team to wear a mask while they are taking care of you, please let them know. For doctor visits, patients may have with them one support person who is at least 75 years old. At this time, visitors are not allowed in the infusion area.

## 2022-01-01 LAB — METHYLMALONIC ACID, SERUM: Methylmalonic Acid, Quantitative: 552 nmol/L — ABNORMAL HIGH (ref 0–378)

## 2022-01-06 ENCOUNTER — Encounter: Payer: Self-pay | Admitting: Internal Medicine

## 2022-01-06 ENCOUNTER — Other Ambulatory Visit: Payer: Self-pay | Admitting: *Deleted

## 2022-01-06 ENCOUNTER — Encounter: Payer: Self-pay | Admitting: *Deleted

## 2022-01-06 ENCOUNTER — Telehealth: Payer: Self-pay

## 2022-01-06 ENCOUNTER — Ambulatory Visit (INDEPENDENT_AMBULATORY_CARE_PROVIDER_SITE_OTHER): Payer: Medicare Other | Admitting: Internal Medicine

## 2022-01-06 ENCOUNTER — Inpatient Hospital Stay (HOSPITAL_COMMUNITY): Payer: Medicare Other | Admitting: Physician Assistant

## 2022-01-06 VITALS — BP 133/63 | HR 82 | Temp 97.7°F | Ht 67.0 in | Wt 176.2 lb

## 2022-01-06 DIAGNOSIS — I8510 Secondary esophageal varices without bleeding: Secondary | ICD-10-CM

## 2022-01-06 DIAGNOSIS — K7030 Alcoholic cirrhosis of liver without ascites: Secondary | ICD-10-CM

## 2022-01-06 DIAGNOSIS — K219 Gastro-esophageal reflux disease without esophagitis: Secondary | ICD-10-CM | POA: Diagnosis not present

## 2022-01-06 DIAGNOSIS — R188 Other ascites: Secondary | ICD-10-CM

## 2022-01-06 DIAGNOSIS — K922 Gastrointestinal hemorrhage, unspecified: Secondary | ICD-10-CM

## 2022-01-06 DIAGNOSIS — Z8619 Personal history of other infectious and parasitic diseases: Secondary | ICD-10-CM | POA: Diagnosis not present

## 2022-01-06 NOTE — Progress Notes (Signed)
Primary Care Physician:  Monico Blitz, MD Primary Gastroenterologist:  Dr. Gala Romney  Pre-Procedure History & Physical: HPI:  Jeffrey [name] is a 76 y.o. 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  No significant bleeding since last contact with our office.  He gets hemodialysis 3 times weekly.  He is on oral iron; he also has received Feraheme during dialysis.  He is followed by hematology.  Last paracentesis was about a month ago.  I see no fluid analysis in the record.  He more recently has been maintained on antibiotic prophylaxis with Bactrim once weekly.  Stool chronically dark on oral iron.  He takes lactulose and Xifaxan; remained fairly lucid according to his wife who accompanies him today. Other comorbidities include diabetes and atrial fibrillation-he is not anticoagulated. Weight is down about 23 pounds over that documented when he was here about a year ago.  Diagnosed recently with gout.  Followed closely by ED internal medicine.  He continues on taking Lasix and spironolactone-dose uncertain.  He is followed by palliative care.  Past Medical History:  Diagnosis Date   A-fib [facility])    when initially stating dialysis january 2021 at [facility] per spouse; never heard anything else about it   Alcoholic cirrhosis [facility])    patient reports completing hep a and b vaccines in 2006   Anemia    has had 3 units of prbcs 2013   Anxiety    Arthritis    Asymptomatic gallstones    Ultrasound in 2006   B12 deficiency 11/21/2020   C. difficile colitis    Cholelithiasis    Chronic kidney disease    Dialysis M/W/F/Sa   Depression    Diabetes ([place])    Duodenal ulcer with hemorrhage    per patient in 2006 or 2007, records have been requested   Dyspnea    low oxygen level -  has O2 2 L all day   Esophageal  varices ([place])    see P[facility]   GERD (gastroesophageal reflux disease)    Heart burn    History of alcohol abuse    quit 05/2013   HOH (hard of hearing)    Hypertension    Liver cirrhosis ([place])    ETOH   SBP (spontaneous bacterial peritonitis) ([place])    2020, November 2021.    Splenomegaly    Ultrasound in 2006    Past Surgical History:  Procedure Laterality Date   AGILE CAPSULE N/A 09/20/2019   Procedure: AGILE CAPSULE;  Surgeon: Daneil Dolin, MD;  [facility];  Service: Endoscopy;  Laterality: N/A;   APPENDECTOMY     AV FISTULA PLACEMENT Left 08/17/2019   Procedure: ARTERIOVENOUS (AV) FISTULA CREATION VERSES ARTERIOVENOUS GRAFT;  Surgeon: [name], MD;  Location: [place];  Service: Vascular;  Laterality: Left;   BASCILIC VEIN TRANSPOSITION Left 09/28/2019   Procedure: LEFT SECOND STAGE [place];  Surgeon: [name], MD;  Location: [place];  Service: Vascular;  Laterality: Left;   BIOPSY  02/03/2018   Procedure: BIOPSY;  Surgeon: Daneil Dolin, MD;  [facility];  Service: Endoscopy;;  bx of gastric polyps   BIOPSY  09/19/2019   Procedure: BIOPSY;  Surgeon: Danie Binder, MD;  [facility];  Service: Endoscopy;;   BIOPSY  01/16/2021   Procedure: BIOPSY;  Surgeon: Gala Romney,  [name], MD;  [facility];  Service: Endoscopy;;   CATARACT EXTRACTION Right    COLONOSCOPY  03/10/2005   Rectal polyp as described above, removed with snare. Left sided  diverticula. The remainder of the colonic mucosa appeared normal. Inflammed polyp on path.   COLONOSCOPY  04/1999   Dr. Thea Silversmith polyps removed,  Path showed hyperplastic   COLONOSCOPY  10/2015   Dr. Britta Mccreedy: diverticulosis, single sessile tubular adenoma 3-34m in size removed from descending colon.    COLONOSCOPY WITH ESOPHAGOGASTRODUODENOSCOPY (EGD)  03/31/2012   [id number]. Colonic diverticulosis. tubular adenoma colon   COLONOSCOPY WITH  ESOPHAGOGASTRODUODENOSCOPY (EGD)  06/2019   FORSYTH: small esophageal varices without high risk stigmata, single large AVM on lesser curvature in gastric body s/p APC ablation, gastric antral and duodenal bulb polyposis. No significant source to explain transfusion dependent anemia. Colonoscopy with portal colopathy and diffuse edema, changes of Cdiff colitis on colonoscopy   COLONOSCOPY WITH PROPOFOL N/A 01/19/2021   Surgeon: CDaryel November MD; 15 mm polyp in descending colon removed piecemeal, 6 mm polyp in ascending colon and 12 mm polyp in sigmoid colon not removed due to high bleeding risk, benign polypoid lesion at splenic flexure, diverticulosis in sigmoid and descending colon, rectal varices.   COLONOSCOPY WITH PROPOFOL N/A 01/16/2021   Surgeon: RDaneil Dolin MD;  Portal colopathy, large rectal varices, polypoid mass at splenic flexure biopsy, two 1 cm left colon polyps not removed, left-sided diverticulosis.  Reported patient had bleeding from splenic flexure lesion.  Pathology with acutely inflamed colonic mucosa with localized ulceration and prominent reactive changes.   ESOPHAGEAL BANDING N/A 02/03/2018   Procedure: ESOPHAGEAL BANDING;  Surgeon: RDaneil Dolin MD;  [facility];  Service: Endoscopy;  Laterality: N/A;   ESOPHAGEAL BANDING  01/16/2021   Procedure: ESOPHAGEAL BANDING;  Surgeon: RDaneil Dolin MD;  [facility];  Service: Endoscopy;;   ESOPHAGOGASTRODUODENOSCOPY  01/15/2005   Three columns grade 1 to 2 esophageal varices, otherwise normal esophageal mucosa.  Esophagus was not manipulated otherwise./Nodularity of the antrum with overlying erosions, nonspecific finding. Path showed rare H.pylori   ESOPHAGOGASTRODUODENOSCOPY  09/2008   Dr. RGaylene Brookscolumns of grade 2-3 esoph varices, only one column was prominent. Portal gastropathy, multiple gastrc polyps at antrum, two were 2cm with black eschar, bulbar polyps, bulbar erosions    ESOPHAGOGASTRODUODENOSCOPY  11/2004   Dr. FBrantley Stage3 esoph varices   ESOPHAGOGASTRODUODENOSCOPY  03/31/2012   RMR: 4 columns(3-GR 2, 1-GR1) non-bleeding esophageal varices, portal gastropathy, small HH, early GAVE, multiple gastric polyps    ESOPHAGOGASTRODUODENOSCOPY (EGD) WITH PROPOFOL N/A 02/03/2018   Dr. RGala Romney Esophageal varices, 3 columns grade 1-2.  Portal hypertensive gastropathy.  Multiple gastric polyps, biopsy consistent with hyperplastic.   ESOPHAGOGASTRODUODENOSCOPY (EGD) WITH PROPOFOL N/A 09/19/2019   Fields: grade I and II esophageal varcies, mild portal hypertensive gastropathy, moderate gastritis but no H. pylori, single hyperplastic gastric polyp removed, obvious source for melena/transfusion dependent anemia not identified, may be due to friable gastric and duodenal mucosa in the setting of portal hypertension   ESOPHAGOGASTRODUODENOSCOPY (EGD) WITH PROPOFOL N/A 01/16/2021   Surgeon: RDaneil Dolin MD; Portal hypertensive gastropathy, grade 2 esophageal varices with bleeding stigmata s/p banding x4, slight nodularity and congestion involving bulb and second portion of duodenum, nonspecific.  Recommended repeat EGD in 4 weeks.   ESOPHAGOGASTRODUODENOSCOPY (EGD) WITH PROPOFOL N/A 05/22/2021   stigmata of prior esophageal banding and no significant varices seen. Portal gastropathy. Polypoid gastric and duodenal mucosa with  overlying erosions, marked mucosal friability.   GASTRIC VARICES BANDING  03/31/2012   Procedure: GASTRIC VARICES BANDING;  Surgeon: Daneil Dolin, MD;  [facility];  Service: Endoscopy;  Laterality: N/A;   GIVENS CAPSULE STUDY N/A 09/21/2019   Poor study with a lot of debris obstructing much of the view of the first approximate 3 hours out of 6 hours.  No obvious source of bleeding identified.  Sequela of bleeding and old blood seen in the form of"whisps" of blood, blood flecks mostly toward the end of the study.   HEMOSTASIS CLIP PLACEMENT   01/19/2021   Procedure: HEMOSTASIS CLIP PLACEMENT;  Surgeon: [name], MD;  Location: [facility] END[facility]COPY;  Service: Gastroenterology;;   IR REMOVAL TUN CV CATH W/O FL  02/15/2020   POLYPECTOMY  09/19/2019   Procedure: POLYPECTOMY;  Surgeon: Danie Binder, MD;  [facility];  Service: Endoscopy;;   POLYPECTOMY  01/19/2021   Procedure: POLYPECTOMY;  Surgeon: [name], MD;  Location: [facility] ENDOSCOPY;  Service: Gastroenterology;;   UMBILICAL HERNIA REPAIR  2017   exploratory laparotomy, abdominal washout    Prior to Admission medications   Medication Sig Start Date End Date Taking? Authorizing Provider  acetaminophen (TYLENOL) 650 MG CR tablet Take 650 mg by mouth every 8 (eight) hours as needed for pain.   Yes [provider]  Alcohol Swabs (ALCOHOL PREP) 70 % PADS Apply 1 each topically 3 (three) times daily. 01/31/20  Yes [provider]  ALPRAZolam Duanne Moron) 0.5 MG tablet Take 0.5 mg by mouth See admin instructions. Take one tablet (0.5 mg) by mouth on Monday, Wednesday, Friday before dialysis, may also take one tablet (0.5 mg) daily as needed for anxiety. 09/21/20  Yes [provider]  Ascorbic Acid (VITAMIN C) 1000 MG tablet Take 1,000 mg by mouth in the morning.   Yes [provider]  BD PEN NEEDLE MICRO U/F 32G X 6 MM MISC SMARTSIG:SUB-Q Daily PRN 04/23/21  Yes [provider]  calcium carbonate (TUMS - DOSED IN MG ELEMENTAL CALCIUM) 500 MG chewable tablet Chew 1 tablet by mouth 3 (three) times daily with meals.   Yes [provider]  Calcium Carbonate-Simethicone (ALKA-SELTZER HEARTBURN + GAS) 750-80 MG CHEW Chew 1-2 tablets by mouth 2 (two) times daily as needed (acid reflux).   Yes [provider]  cetirizine (ZYRTEC) 10 MG tablet Take 10 mg by mouth daily. 06/24/21  Yes [provider]  cholecalciferol (VITAMIN D3) 25 MCG (1000 UNIT) tablet Take 1,000 Units by mouth daily.   Yes [provider]  cloNIDine (CATAPRES) 0.1 MG tablet Take 0.1 mg by mouth 2 (two) times daily. Take if blood pressure will not go down   Yes [provider]  CONSTULOSE 10 GM/15ML solution Take by mouth. 11/21/21  Yes [provider]  Continuous Blood Gluc Sensor (FREESTYLE LIBRE 2 SENSOR) MISC Apply topically. 10/15/21  Yes [provider]  diltiazem (DILACOR XR) 120 MG 24 hr capsule Take 120 mg by mouth daily. 11/19/21  Yes [provider]  famotidine (PEPCID) 20 MG tablet Take 1 tablet (20 mg total) by mouth 2 (two) times daily. 07/30/21 07/30/22 Yes Barton Dubois, MD  furosemide (LASIX) 40 MG tablet Take 80 mg by mouth daily. 12/29/19  Yes [provider]  gabapentin (NEURONTIN) 100 MG capsule TAKE 1 CAPSULE BY MOUTH AT BEDTIME Patient taking differently: Take 100 mg by mouth 3 (three) times daily. 05/28/20  Yes Waynetta Sandy,  MD  Garlic 9622 MG CAPS Take 1,000 mg by mouth daily.   Yes [provider]  GLUCAGEN HYPOKIT 1 MG SOLR injection Inject 1 mg into the muscle once as needed for low blood sugar. 11/28/20  Yes [provider]  HUMALOG KWIKPEN 100 UNIT/ML KwikPen 10-50 Units 3 (three) times daily. Sliding scale 04/23/21  Yes [provider]  hydrOXYzine (ATARAX/VISTARIL) 25 MG tablet Take 25 mg by mouth 3 (three) times daily. For itching 11/23/20  Yes [provider]  iron polysaccharides (NIFEREX) 150 MG capsule Take 150 mg by mouth daily with supper.   Yes [provider]  lidocaine-prilocaine (EMLA) cream Apply 2 application topically daily. 06/11/21  Yes [provider]  metoprolol succinate (TOPROL-XL) 25 MG 24 hr tablet Take 1 tablet (25 mg total) by mouth See admin instructions. Take one tablet (25 mg) by mouth at bedtime every night and take one tablet (25 mg) in morning on  Sun, Tues, Thurs and Sat (non-dialysis days) 04/15/21  Yes Johnson, Clanford L, MD  midodrine (PROAMATINE) 10 MG  tablet Take by mouth. 08/20/21  Yes [provider]  mirtazapine (REMERON) 30 MG tablet Take 1 tablet (30 mg total) by mouth at bedtime. Patient taking differently: Take 30 mg by mouth daily with supper. 02/10/20  Yes Roxan Hockey, MD  Multiple Vitamin (MULTIVITAMIN WITH MINERALS) TABS tablet Take 1 tablet by mouth in the morning.    Yes [provider]  omega-3 acid ethyl esters (LOVAZA) 1 g capsule Take 2 g by mouth 2 (two) times daily.  04/07/19  Yes [provider]  ondansetron (ZOFRAN) 4 MG tablet Take 4 mg by mouth every 8 (eight) hours as needed for nausea or vomiting.   Yes [provider]  oxyCODONE-acetaminophen (PERCOCET/ROXICET) 5-325 MG tablet Take 1 tablet by mouth every 4 (four) hours as needed for moderate pain. 02/27/20  Yes [provider]  pantoprazole (PROTONIX) 40 MG tablet Take 40 mg by mouth daily. 08/20/21  Yes [provider]  Ped Vitamins ACD Fl-Iron (TRI-VIT/FLUORIDE/IRON PO) Take 1 tablet by mouth daily. 08/01/19  Yes [provider]  pravastatin (PRAVACHOL) 20 MG tablet Take 20 mg by mouth in the morning.    Yes [provider]  Probiotic Product (PROBIOTIC DAILY PO) Take 1 tablet by mouth in the morning.    Yes [provider]  spironolactone (ALDACTONE) 50 MG tablet Take 50 mg by mouth in the morning.    Yes [provider]  sulfamethoxazole-trimethoprim (BACTRIM DS) 800-160 MG tablet Take 1 tablet by mouth once a week. Take every Friday after dialysis. 09/23/21  Yes Erenest Rasher, PA-C  thiamine (VITAMIN B-1) 100 MG tablet Take 100 mg by mouth daily. 08/27/20  Yes [provider]  tiZANidine (ZANAFLEX) 2 MG tablet Take 2 mg by mouth 2 (two) times daily. 08/20/21  Yes [provider]  TRESIBA FLEXTOUCH 200 UNIT/ML FlexTouch Pen Inject 120 Units into the skin at bedtime. 04/22/21  Yes [provider]  triamcinolone cream (KENALOG) 0.1 % Apply 1 application  topically 2 (two) times daily as needed (dry skin Itching). 12/30/17  Yes [provider]  venlafaxine (EFFEXOR) 75 MG tablet Take 75 mg by mouth 3 (three) times daily with meals.    Yes [provider]  XIFAXAN 550 MG TABS tablet TAKE 1 TABLET BY MOUTH TWICE DAILY Patient taking differently: Take 550 mg by mouth 2 (two) times daily. 02/13/21  Yes Mahala Menghini, PA-C  Allergies as of 01/06/2022 - Review Complete 01/06/2022  Allergen Reaction Noted   Tape Itching, Rash, and Other (See Comments) 09/18/2019    Family History  Problem Relation Age of Onset   Colon cancer Father 50       deceased age 19   Breast cancer Sister        deceased   Diabetes Sister    Stroke Mother    Healthy Son    Healthy Daughter    Lung cancer Neg Hx    Ovarian cancer Neg Hx     Social History   Socioeconomic History   Marital status: Married    Spouse name: Not on file   Number of children: 2   Years of education: Not on file   Highest education level: Not on file  Occupational History   Occupation: RETIRED    Employer: SELF EMPLOYED  Tobacco Use   Smoking status: Former    Packs/day: 2.00    Years: 25.00    Total pack years: 50.00    Types: Cigarettes    Quit date: 01/28/1986    Years since quitting: 35.9   Smokeless tobacco: Never  Vaping Use   Vaping Use: Never used  Substance and Sexual Activity   Alcohol use: No    Comment: quit in 05/2013   Drug use: No   Sexual activity: Not Currently  Other Topics Concern   Not on file  Social History Narrative   Two step children from second marriage (divorced) who live with him along with some grandchildren.    Social Determinants of Health   Financial Resource Strain: Not on file  Food Insecurity: No Food Insecurity (09/30/2021)   Hunger Vital Sign    Worried About Running Out of Food in the Last Year: Never true    Ran Out of Food in the Last Year: Never true  Transportation Needs: No Transportation Needs  (09/30/2021)   PRAPARE - Hydrologist (Medical): No    Lack of Transportation (Non-Medical): No  Physical Activity: Inactive (05/30/2020)   Exercise Vital Sign    Days of Exercise per Week: 0 days    Minutes of Exercise per Session: 0 min  Stress: Not on file  Social Connections: Not on file  Intimate Partner Violence: Not At Risk (05/30/2020)   Humiliation, Afraid, Rape, and Kick questionnaire    Fear of Current or Ex-Partner: No    Emotionally Abused: No    Physically Abused: No    Sexually Abused: No    Review of Systems: See HPI, otherwise negative ROS  Physical Exam: BP 133/63 (BP Location: Right Arm, Patient Position: Sitting, Cuff Size: Normal)   Pulse 82   Temp 97.7 F (36.5 C) (Temporal)   Ht '5\' 7"'$  (1.702 m)   Wt 176 lb 3.2 oz (79.9 kg)   SpO2 96%   BMI 27.60 kg/m  General:   Alert, conversant.  Well oriented.  No flap.  Accompanied by his spouse.  Confined to a wheelchair.   Neck:  Supple; no masses or thyromegaly. No significant cervical adenopathy. Lungs:  Clear throughout to auscultation.   No wheezes, crackles, or rhonchi. No acute distress. Heart:  Regular rate and rhythm; no murmurs, clicks, rubs,  or gallops. Abdomen: Full.  Positive bowel sounds soft nontender he does have a fluid wave.   Pulses:  Normal pulses noted. Extremities: 1+ lower extremity edema.  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  Cirrhosis: end-stage disease.   IDA: continue with Hematology. Multiple sources for chronic blood loss.  Accuracy of medic  creation list is questionable. This gentleman remains chronically decompensated to varying degrees with most multiple significant comorbidities.  He is desirous of a repeat tap for comfort measures at this time.  That certainly not unreasonable.  Recommendations   All in all, cirrhosis is stable.  No evidence of GI bleeding recently.  ferritin level is normal.  It Is very important to get a list of current medications.  Please call us back this afternoon.  As discussed, we will schedule a paracentesis in the near future.  Further recommendations to follow.  Office visit here in 3 months.   Notice: This dictation was prepared with Dragon dictation along with smaller phrase technology. Any transcriptional errors that result from this process are unintentional and may not be corrected upon review.

## 2022-01-06 NOTE — Telephone Encounter (Signed)
Pt's wife called in and updated pt's med list.

## 2022-01-06 NOTE — Patient Instructions (Signed)
It was good to see you again today!  All in all your cirrhosis is stable.  No evidence of GI bleeding recently.  Your ferritin level is normal.  It Is very important to get a list of your current medications.  Please call us back this afternoon.  As discussed, we will schedule a paracentesis in the near future.  Further recommendations to follow.  Office visit here in 3 months.

## 2022-01-07 ENCOUNTER — Inpatient Hospital Stay (HOSPITAL_COMMUNITY): Payer: Medicare Other | Admitting: Physician Assistant

## 2022-01-08 ENCOUNTER — Inpatient Hospital Stay (HOSPITAL_BASED_OUTPATIENT_CLINIC_OR_DEPARTMENT_OTHER): Payer: Medicare Other | Admitting: Physician Assistant

## 2022-01-08 VITALS — BP 115/58 | HR 69 | Temp 97.9°F | Resp 18 | Ht 65.0 in | Wt 177.4 lb

## 2022-01-08 DIAGNOSIS — D509 Iron deficiency anemia, unspecified: Secondary | ICD-10-CM | POA: Diagnosis not present

## 2022-01-08 DIAGNOSIS — E538 Deficiency of other specified B group vitamins: Secondary | ICD-10-CM | POA: Diagnosis not present

## 2022-01-08 DIAGNOSIS — N186 End stage renal disease: Secondary | ICD-10-CM | POA: Diagnosis not present

## 2022-01-08 DIAGNOSIS — D61818 Other pancytopenia: Secondary | ICD-10-CM | POA: Diagnosis not present

## 2022-01-08 DIAGNOSIS — D500 Iron deficiency anemia secondary to blood loss (chronic): Secondary | ICD-10-CM | POA: Diagnosis not present

## 2022-01-08 DIAGNOSIS — D649 Anemia, unspecified: Secondary | ICD-10-CM

## 2022-01-08 NOTE — Patient Instructions (Signed)
[place] [facility] Discharge Instructions  You were seen today by Tarri Abernethy PA-C for your follow-up visit.    Your blood levels are essentially stable at your baseline.  You do not need any blood transfusion today, but you may benefit from some additional IV iron.  PLAN: - Monthly CBC and possible blood transfusion if needed - Monthly vitamin B12 injection - Monthly IV iron  FOLLOW-UP APPOINTMENT: Repeat full lab panel in 3 months with office visit the following week  ** Thank you for trusting me with your healthcare!  I strive to provide all of my patients with quality care at each visit.  If you receive a survey for this visit, I would be so grateful to you for taking the time to provide feedback.  Thank you in advance!  ~ Keishia Ground                   Dr. Derek Jack   &   Tarri Abernethy, PA-C   - - - - - - - - - - - - - - - - - -     Thank you for choosing [place] at [facility] to provide your oncology and hematology care.  To afford each patient quality time with our provider, please arrive at least 15 minutes before your scheduled appointment time.   If you have a lab appointment with the [place] please come in thru the Main Entrance and check in at the main information desk.  You need to re-schedule your appointment should you arrive 10 or more minutes late.  We strive to give you quality time with our providers, and arriving late affects you and other patients whose appointments are after yours.  Also, if you no show three or more times for appointments you may be dismissed from the clinic at the providers discretion.     Again, thank you for choosing [facility]  Our hope is that these requests will decrease the amount of time that you wait before being seen by our physicians.       _____________________________________________________________  Should you have questions after your visit  to [facility], please contact our office at [phone] and follow the prompts.  Our office hours are 8:00 a.m. and 4:30 p.m. Monday - Friday.  Please note that voicemails left after 4:00 p.m. may not be returned until the following business day.  We are closed weekends and major holidays.  You do have access to a nurse 24-7, just call the main number to the clinic [phone] and do not press any options, hold on the line and a nurse will answer the phone.    For prescription refill requests, have your pharmacy contact our office and allow 72 hours.    Due to Covid, you will need to wear a mask upon entering the hospital. If you do not have a mask, a mask will be given to you at the Main Entrance upon arrival. For doctor visits, patients may have 1 support person age 49 or older with them. For treatment visits, patients can not have anyone with them due to social distancing guidelines and our immunocompromised population.

## 2022-01-08 NOTE — Progress Notes (Signed)
[place], [place] [address]   CLINIC:  Medical Oncology/Hematology  PCP:  Monico Blitz, MD [place] [id number] [phone]   REASON FOR VISIT:  Follow-up for iron deficiency anemia, anemia of ESRD   PRIOR THERAPY: Intermittent IV iron supplementation   CURRENT THERAPY: Epogen (at dialysis), IV iron supplementation, PRBC transfusions as needed, monthly B12 injections  INTERVAL HISTORY:  Jeffrey [name] 76 y.o. male returns for routine follow-up of his iron deficiency anemia, anemia of ESRD, thrombocytopenia, and leukopenia.  He was last seen by Tarri Abernethy PA-C on 10/02/2021.  At today's visit, he reports feeling fair.  He has gout in his left hand, which is slowly improving.  He has occasional scant rectal bleeding, has not noticed any recent melena.  No epistaxis or hematuria. He continues to report easy bruising.  At his last appointment, he was noted to have lost about 10 pounds in 3 months, which he attributed to eating less than he used to and eating more salads.  Over the past 3 months, his weight has been stable and he has not had any further weight loss. He denies any fevers, chills, or frequent infections.  He continues to have moderate fatigue, as well as pica, occasional headaches, dyspnea on exertion.  He denies any restless legs, chest pain, lightheadedness, or syncope.  He has 75% energy and 70% appetite. He endorses that he is maintaining a stable weight.   REVIEW OF SYSTEMS:    Review of Systems  Constitutional:  Positive for appetite change and fatigue. Negative for chills, diaphoresis, fever and unexpected weight change.  HENT:   Negative for lump/mass and nosebleeds.   Eyes:  Negative for eye problems.  Respiratory:  Positive for shortness of breath (with exertion, on 2 L oxygen at home). Negative for cough and hemoptysis.   Cardiovascular:  Negative for chest pain, leg swelling and palpitations.   Gastrointestinal:  Positive for nausea. Negative for abdominal pain, blood in stool, constipation, diarrhea and vomiting.  Genitourinary:  Negative for hematuria.   Musculoskeletal:  Positive for arthralgias (gout in left hand) and back pain.  Skin: Negative.   Neurological:  Positive for dizziness and headaches. Negative for light-headedness.  Hematological:  Bruises/bleeds easily.  Psychiatric/Behavioral:  Positive for depression. The patient is nervous/anxious.       PAST MEDICAL/SURGICAL HISTORY:  Past Medical History:  Diagnosis Date   A-fib [facility])    when initially stating dialysis january 2021 at [facility] per spouse; never heard anything else about it   Alcoholic cirrhosis [facility])    patient reports completing hep a and b vaccines in 2006   Anemia    has had 3 units of prbcs 2013   Anxiety    Arthritis    Asymptomatic gallstones    Ultrasound in 2006   B12 deficiency 11/21/2020   C. difficile colitis    Cholelithiasis    Chronic kidney disease    Dialysis M/W/F/Sa   Depression    Diabetes ([place])    Duodenal ulcer with hemorrhage    per patient in 2006 or 2007, records have been requested   Dyspnea    low oxygen level -  has O2 2 L all day   Esophageal varices ([place])    see [facility]   GERD (gastroesophageal reflux disease)    Heart burn    History of alcohol abuse    quit 05/2013   HOH (hard of hearing)  Hypertension    Liver cirrhosis ([facility])    ETOH   SBP (spontaneous bacterial peritonitis) ([place])    2020, November 2021.    Splenomegaly    Ultrasound in 2006   Past Surgical History:  Procedure Laterality Date   AGILE CAPSULE N/A 09/20/2019   Procedure: AGILE CAPSULE;  Surgeon: Daneil Dolin, MD;  [facility];  Service: Endoscopy;  Laterality: N/A;   APPENDECTOMY     AV FISTULA PLACEMENT Left 08/17/2019   Procedure: ARTERIOVENOUS (AV) FISTULA CREATION VERSES ARTERIOVENOUS GRAFT;  Surgeon: [name], MD;  Location: [facility] OR;  Service:  Vascular;  Laterality: Left;   BASCILIC VEIN TRANSPOSITION Left 09/28/2019   Procedure: LEFT SECOND STAGE [place];  Surgeon: [name], MD;  Location: [facility];  Service: Vascular;  Laterality: Left;   BIOPSY  02/03/2018   Procedure: BIOPSY;  Surgeon: Daneil Dolin, MD;  [facility];  Service: Endoscopy;;  bx of gastric polyps   BIOPSY  09/19/2019   Procedure: BIOPSY;  Surgeon: Danie Binder, MD;  [facility];  Service: Endoscopy;;   BIOPSY  01/16/2021   Procedure: BIOPSY;  Surgeon: Daneil Dolin, MD;  [facility];  Service: Endoscopy;;   CATARACT EXTRACTION Right    COLONOSCOPY  03/10/2005   Rectal polyp as described above, removed with snare. Left sided  diverticula. The remainder of the colonic mucosa appeared normal. Inflammed polyp on path.   COLONOSCOPY  04/1999   Dr. Thea Silversmith polyps removed,  Path showed hyperplastic   COLONOSCOPY  10/2015   Dr. Britta Mccreedy: diverticulosis, single sessile tubular adenoma 3-36mm in size removed from descending colon.    COLONOSCOPY WITH ESOPHAGOGASTRODUODENOSCOPY (EGD)  03/31/2012   TDS:KAJGOTL AVMs. Colonic diverticulosis. tubular adenoma colon   COLONOSCOPY WITH ESOPHAGOGASTRODUODENOSCOPY (EGD)  06/2019   FORSYTH: small esophageal varices without high risk stigmata, single large AVM on lesser curvature in gastric body s/p APC ablation, gastric antral and duodenal bulb polyposis. No significant source to explain transfusion dependent anemia. Colonoscopy with portal colopathy and diffuse edema, changes of Cdiff colitis on colonoscopy   COLONOSCOPY WITH PROPOFOL N/A 01/19/2021   Surgeon: [name], MD; 15 mm polyp in descending colon removed piecemeal, 6 mm polyp in ascending colon and 12 mm polyp in sigmoid colon not removed due to high bleeding risk, benign polypoid lesion at splenic flexure, diverticulosis in sigmoid and descending colon, rectal varices.   COLONOSCOPY WITH PROPOFOL  N/A 01/16/2021   Surgeon: Daneil Dolin, MD;  Portal colopathy, large rectal varices, polypoid mass at splenic flexure biopsy, two 1 cm left colon polyps not removed, left-sided diverticulosis.  Reported patient had bleeding from splenic flexure lesion.  Pathology with acutely inflamed colonic mucosa with localized ulceration and prominent reactive changes.   ESOPHAGEAL BANDING N/A 02/03/2018   Procedure: ESOPHAGEAL BANDING;  Surgeon: Daneil Dolin, MD;  [facility];  Service: Endoscopy;  Laterality: N/A;   ESOPHAGEAL BANDING  01/16/2021   Procedure: ESOPHAGEAL BANDING;  Surgeon: Daneil Dolin, MD;  [facility];  Service: Endoscopy;;   ESOPHAGOGASTRODUODENOSCOPY  01/15/2005   Three columns grade 1 to 2 esophageal varices, otherwise normal esophageal mucosa.  Esophagus was not manipulated otherwise./Nodularity of the antrum with overlying erosions, nonspecific finding. Path showed rare H.pylori   ESOPHAGOGASTRODUODENOSCOPY  09/2008   Dr. Gaylene Brooks columns of grade 2-3 esoph varices, only one column was prominent. Portal gastropathy, multiple gastrc polyps at antrum, two were 2cm  with black eschar, bulbar polyps, bulbar erosions   ESOPHAGOGASTRODUODENOSCOPY  11/2004   Dr. Brantley Stage 3 esoph varices   ESOPHAGOGASTRODUODENOSCOPY  03/31/2012   RMR: 4 columns(3-GR 2, 1-GR1) non-bleeding esophageal varices, portal gastropathy, small HH, early GAVE, multiple gastric polyps    ESOPHAGOGASTRODUODENOSCOPY (EGD) WITH PROPOFOL N/A 02/03/2018   Dr. Gala Romney: Esophageal varices, 3 columns grade 1-2.  Portal hypertensive gastropathy.  Multiple gastric polyps, biopsy consistent with hyperplastic.   ESOPHAGOGASTRODUODENOSCOPY (EGD) WITH PROPOFOL N/A 09/19/2019   Fields: grade I and II esophageal varcies, mild portal hypertensive gastropathy, moderate gastritis but no H. pylori, single hyperplastic gastric polyp removed, obvious source for melena/transfusion dependent anemia not  identified, may be due to friable gastric and duodenal mucosa in the setting of portal hypertension   ESOPHAGOGASTRODUODENOSCOPY (EGD) WITH PROPOFOL N/A 01/16/2021   Surgeon: Daneil Dolin, MD; Portal hypertensive gastropathy, grade 2 esophageal varices with bleeding stigmata s/p banding x4, slight nodularity and congestion involving bulb and second portion of duodenum, nonspecific.  Recommended repeat EGD in 4 weeks.   ESOPHAGOGASTRODUODENOSCOPY (EGD) WITH PROPOFOL N/A 05/22/2021   stigmata of prior esophageal banding and no significant varices seen. Portal gastropathy. Polypoid gastric and duodenal mucosa with overlying erosions, marked mucosal friability.   GASTRIC VARICES BANDING  03/31/2012   Procedure: GASTRIC VARICES BANDING;  Surgeon: Daneil Dolin, MD;  [facility];  Service: Endoscopy;  Laterality: N/A;   GIVENS CAPSULE STUDY N/A 09/21/2019   Poor study with a lot of debris obstructing much of the view of the first approximate 3 hours out of 6 hours.  No obvious source of bleeding identified.  Sequela of bleeding and old blood seen in the form of"whisps" of blood, blood flecks mostly toward the end of the study.   HEMOSTASIS CLIP PLACEMENT  01/19/2021   Procedure: HEMOSTASIS CLIP PLACEMENT;  Surgeon: [name], MD;  Location: [facility] ENDOSCOPY;  Service: Gastroenterology;;   IR REMOVAL TUN CV CATH W/O FL  02/15/2020   POLYPECTOMY  09/19/2019   Procedure: POLYPECTOMY;  Surgeon: Danie Binder, MD;  [facility];  Service: Endoscopy;;   POLYPECTOMY  01/19/2021   Procedure: POLYPECTOMY;  Surgeon: [name], MD;  Location: [facility] ENDOSCOPY;  Service: Gastroenterology;;   UMBILICAL HERNIA REPAIR  2017   exploratory laparotomy, abdominal washout     SOCIAL HISTORY:  Social History   Socioeconomic History   Marital status: Married    Spouse name: Not on file   Number of children: 2   Years of education: Not on file   Highest education level: Not  on file  Occupational History   Occupation: RETIRED    Employer: SELF EMPLOYED  Tobacco Use   Smoking status: Former    Packs/day: 2.00    Years: 25.00    Total pack years: 50.00    Types: Cigarettes    Quit date: 01/28/1986    Years since quitting: 35.9   Smokeless tobacco: Never  Vaping Use   Vaping Use: Never used  Substance and Sexual Activity   Alcohol use: No    Comment: quit in 05/2013   Drug use: No   Sexual activity: Not Currently  Other Topics Concern   Not on file  Social History Narrative   Two step children from second marriage (divorced) who live with him along with some grandchildren.    Social Determinants of Health   Financial Resource Strain: Not on file  Food Insecurity: No Food Insecurity (09/30/2021)   Hunger Vital Sign  Worried About Charity fundraiser in the Last Year: Never true    [place] in the Last Year: Never true  Transportation Needs: No Transportation Needs (09/30/2021)   PRAPARE - Hydrologist (Medical): No    Lack of Transportation (Non-Medical): No  Physical Activity: Inactive (05/30/2020)   Exercise Vital Sign    Days of Exercise per Week: 0 days    Minutes of Exercise per Session: 0 min  Stress: Not on file  Social Connections: Not on file  Intimate Partner Violence: Not At Risk (05/30/2020)   Humiliation, Afraid, Rape, and Kick questionnaire    Fear of Current or Ex-Partner: No    Emotionally Abused: No    Physically Abused: No    Sexually Abused: No    FAMILY HISTORY:  Family History  Problem Relation Age of Onset   Colon cancer Father 63       deceased age 82   Breast cancer Sister        deceased   Diabetes Sister    Stroke Mother    Healthy Son    Healthy Daughter    Lung cancer Neg Hx    Ovarian cancer Neg Hx     CURRENT MEDICATIONS:  Outpatient Encounter Medications as of 01/08/2022  Medication Sig Note   acetaminophen (TYLENOL) 650 MG CR tablet Take 650 mg by mouth every 8  (eight) hours as needed for pain.    Alcohol Swabs (ALCOHOL PREP) 70 % PADS Apply 1 each topically 3 (three) times daily.    allopurinol (ZYLOPRIM) 300 MG tablet Take 300 mg by mouth daily.    ALPRAZolam (XANAX) 0.5 MG tablet Take 0.5 mg by mouth See admin instructions. Take one tablet (0.5 mg) by mouth on Monday, Wednesday, Friday before dialysis, may also take one tablet (0.5 mg) daily as needed for anxiety.    Ascorbic Acid (VITAMIN C) 1000 MG tablet Take 1,000 mg by mouth in the morning.    BD PEN NEEDLE MICRO U/F 32G X 6 MM MISC SMARTSIG:SUB-Q Daily PRN    Biotin 10000 MCG TABS Take 10,000 mcg by mouth daily.    calcium carbonate (TUMS - DOSED IN MG ELEMENTAL CALCIUM) 500 MG chewable tablet Chew 1 tablet by mouth 3 (three) times daily with meals.    Calcium Carbonate-Simethicone (ALKA-SELTZER HEARTBURN + GAS) 750-80 MG CHEW Chew 1-2 tablets by mouth 2 (two) times daily as needed (acid reflux).    cetirizine (ZYRTEC) 10 MG tablet Take 10 mg by mouth daily.    cholecalciferol (VITAMIN D3) 25 MCG (1000 UNIT) tablet Take 1,000 Units by mouth daily.    colchicine 0.6 MG tablet Take 0.6 mg by mouth 2 (two) times daily.    CONSTULOSE 10 GM/15ML solution Take by mouth.    Continuous Blood Gluc Sensor (FREESTYLE LIBRE 2 SENSOR) MISC Apply topically.    diltiazem (DILACOR XR) 120 MG 24 hr capsule Take 120 mg by mouth daily.    famotidine (PEPCID) 20 MG tablet Take 1 tablet (20 mg total) by mouth 2 (two) times daily.    furosemide (LASIX) 40 MG tablet Take 80 mg by mouth daily.    gabapentin (NEURONTIN) 100 MG capsule TAKE 1 CAPSULE BY MOUTH AT BEDTIME (Patient taking differently: Take 100 mg by mouth 3 (three) times daily.)    Garlic 8315 MG CAPS Take 1,000 mg by mouth daily.    GLUCAGEN HYPOKIT 1 MG SOLR injection Inject 1 mg into the muscle  once as needed for low blood sugar.    HUMALOG KWIKPEN 100 UNIT/ML KwikPen 10-50 Units 3 (three) times daily. Sliding scale    hydrOXYzine (ATARAX/VISTARIL) 25  MG tablet Take 25 mg by mouth 3 (three) times daily. For itching    iron polysaccharides (NIFEREX) 150 MG capsule Take 150 mg by mouth daily with supper.    metoprolol succinate (TOPROL-XL) 25 MG 24 hr tablet Take 1 tablet (25 mg total) by mouth See admin instructions. Take one tablet (25 mg) by mouth at bedtime every night and take one tablet (25 mg) in morning on  Sun, Tues, Thurs and Sat (non-dialysis days)    Milk Thistle 250 MG CAPS Take 250 mg by mouth in the morning, at noon, and at bedtime.    mirtazapine (REMERON) 30 MG tablet Take 1 tablet (30 mg total) by mouth at bedtime. (Patient taking differently: Take 30 mg by mouth daily with supper.)    Multiple Vitamin (MULTIVITAMIN WITH MINERALS) TABS tablet Take 1 tablet by mouth in the morning.     Multiple Vitamins-Minerals (OCUVITE ADULT 50+ PO) Take by mouth.    omega-3 acid ethyl esters (LOVAZA) 1 g capsule Take 2 g by mouth 2 (two) times daily.     ondansetron (ZOFRAN) 4 MG tablet Take 4 mg by mouth every 8 (eight) hours as needed for nausea or vomiting.    oxyCODONE-acetaminophen (PERCOCET/ROXICET) 5-325 MG tablet Take 1 tablet by mouth every 4 (four) hours as needed for moderate pain.    Ped Vitamins ACD Fl-Iron (TRI-VIT/FLUORIDE/IRON PO) Take 1 tablet by mouth daily. 05/14/2021: [facility]    pravastatin (PRAVACHOL) 20 MG tablet Take 20 mg by mouth in the morning.     Probiotic Product (PROBIOTIC DAILY PO) Take 1 tablet by mouth in the morning.     spironolactone (ALDACTONE) 50 MG tablet Take 50 mg by mouth in the morning.     sulfamethoxazole-trimethoprim (BACTRIM DS) 800-160 MG tablet Take 1 tablet by mouth once a week. Take every Friday after dialysis.    thiamine (VITAMIN B-1) 100 MG tablet Take 100 mg by mouth daily.    tiZANidine (ZANAFLEX) 2 MG tablet Take 2 mg by mouth 2 (two) times daily.    TRESIBA FLEXTOUCH 200 UNIT/ML FlexTouch Pen Inject 120 Units into the skin at bedtime.    triamcinolone cream (KENALOG) 0.1 % Apply 1  application topically 2 (two) times daily as needed (dry skin Itching).    venlafaxine (EFFEXOR) 75 MG tablet Take 75 mg by mouth 3 (three) times daily with meals.     XIFAXAN 550 MG TABS tablet TAKE 1 TABLET BY MOUTH TWICE DAILY (Patient taking differently: Take 550 mg by mouth 2 (two) times daily.)    No facility-administered encounter medications on file as of 01/08/2022.    ALLERGIES:  Allergies  Allergen Reactions   Tape Itching, Rash and Other (See Comments)    Tears skin     PHYSICAL EXAM:    ECOG PERFORMANCE STATUS: 2 - Symptomatic, <50% confined to bed  There were no vitals filed for this visit.  There were no vitals filed for this visit.  Physical Exam Constitutional:      Appearance: Normal appearance. He is obese.  HENT:     Head: Normocephalic and atraumatic.     Mouth/Throat:     Mouth: Mucous membranes are moist.  Eyes:     Extraocular Movements: Extraocular movements intact.     Pupils: Pupils are equal, round, and reactive to light.  Cardiovascular:     Rate and Rhythm: Normal rate and regular rhythm.     Pulses: Normal pulses.     Heart sounds: Murmur heard.  Pulmonary:     Effort: Pulmonary effort is normal.     Breath sounds: Decreased breath sounds, rhonchi (faint) and rales present.     Comments: Faint crackles and decreased air movement bilaterally Abdominal:     General: Bowel sounds are normal. There is distension.     Palpations: Abdomen is soft.     Tenderness: There is no abdominal tenderness.  Musculoskeletal:        General: No swelling.     Right lower leg: Edema (1+ edema with dusky red hyperpigmentation of skin) present.     Left lower leg: Edema (1+ edema with dusky red hyperpigmentation of skin) present.  Lymphadenopathy:     Cervical: No cervical adenopathy.  Skin:    General: Skin is warm and dry.     Findings: Bruising (Scattered bruising on all 4 extremities) present.  Neurological:     General: No focal deficit present.      Mental Status: He is alert and oriented to person, place, and time.  Psychiatric:        Mood and Affect: Mood normal.        Behavior: Behavior normal.     LABORATORY DATA:  I have reviewed the labs as listed.  CBC    Component Value Date/Time   WBC 2.7 (L) 12/30/2021 1051   RBC 2.62 (L) 12/30/2021 1051   HGB 8.1 (L) 12/30/2021 1051   HCT 27.0 (L) 12/30/2021 1051   HCT 28 02/25/2012 1034   PLT 91 (L) 12/30/2021 1051   PLT 125 02/25/2012 1034   MCV 103.1 (H) 12/30/2021 1051   MCV 82.0 02/25/2012 1034   MCH 30.9 12/30/2021 1051   MCHC 30.0 12/30/2021 1051   RDW 17.9 (H) 12/30/2021 1051   LYMPHSABS 0.2 (L) 12/30/2021 1051   MONOABS 0.2 12/30/2021 1051   EOSABS 0.0 12/30/2021 1051   BASOSABS 0.0 12/30/2021 1051      Latest Ref Rng & Units 09/18/2021    8:30 AM 08/10/2021   11:41 AM 07/30/2021    5:17 AM  CMP  Glucose 70 - 99 mg/dL 243  207  68   BUN 8 - 23 mg/dL 21  23  20    Creatinine 0.61 - 1.24 mg/dL 2.21  3.09  2.71   Sodium 135 - 145 mmol/L 132  132  131   Potassium 3.5 - 5.1 mmol/L 3.1  4.1  3.9   Chloride 98 - 111 mmol/L 94  96  95   CO2 22 - 32 mmol/L 28  26  26    Calcium 8.9 - 10.3 mg/dL 8.3  9.9  8.6   Total Protein 6.5 - 8.1 g/dL 6.9  7.5    Total Bilirubin 0.3 - 1.2 mg/dL 0.7  0.9    Alkaline Phos 38 - 126 U/L 224  249    AST 15 - 41 U/L 20  24    ALT 0 - 44 U/L 21  22      DIAGNOSTIC IMAGING:  I have independently reviewed the relevant imaging and discussed with the patient.  ASSESSMENT & PLAN: 1.  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  Vitamin B12 at 991, methylmalonic acid 552.  CMP at baseline. - MDS has not yet been ruled out via bone marrow biopsy, as there would be no change in the patient's treatment plan  regardless of results.  Due to his overall fragile state of health with poor prognosis, supportive care remains his best treatment option; he would not be a candidate for any chemotherapeutic options.   - PLAN: Blood counts are stable at baseline.  We will defer bone marrow biopsy at this time.  Further plan as below.   2.  Macrocytic anemia, transfusion-dependent   - Anemia is related to his ESRD (receives Epogen at dialysis), end-stage liver disease/cirrhosis, and intermittent GI bleeding.  - Macrocytosis secondary to cirrhotic liver disease - He had been receiving PRBC transfusions on average every 4-8 weeks.  However, he has not required blood transfusions since hospitalization in March 2023. - Intermittent melena with Hemoccult positive stool and extensive work-up for gastrointestinal blood loss: EGD (01/16/2021): Portal hypertensive gastropathy, grade 2 esophageal varices with bleeding stigmata s/p esophageal band ligation Colonoscopy (01/16/2021): Portal colopathy with large rectal varices, polypoid mass at splenic flexure thought to be source of bleeding. Repeat EGD (05/22/2021): Portal gastropathy with marked mucosal friability; polypoid gastric and duodenal mucosa with overlying erosions.  Stigmata of prior esophageal band ligation with no significant varices seen. - Hospitalized from 01/17/2021 through 01/20/2021 with rectal bleeding and acute blood loss anemia (Hgb 5.9).  Colonoscopy (01/19/2021) showed benign polypoid lesion at splenic flexure, likely inflammatory polyp thought to be source of bleeding, with possibility of variceal hemorrhage as well.  He received 4 units PRBC during this hospital stay. - Work-up with SPEP and immunofixation was negative. Hemolysis labs were negative with normal LDH and reticulocyte count. - He has been receiving monthly Venofer 300 mg since May 2023, with noted decrease in the frequency of his blood transfusions - Receives Epogen at dialysis - Most recent  labs (12/30/2021): Hgb 8.1/MCV 103.1, ferritin 182, iron saturation 20% - He denies any recent bright red blood per rectum or melena since his last appointment, although he has has previously had episodes of rectal bleeding and melena. - Symptomatic with chronic fatigue at baseline, and other chronic symptoms related in HPI/ROS above     - PLAN: We will continue Venofer 300 mg monthly in the setting of chronic GI bleeds and functional iron deficiency. - Monthly CBC/BB sample with PRBC transfusion for Hgb <7.0 - Continue GI follow-up for chronic bleeding. - RTC in 3 months with full lab panel before visit.   3.  Thrombocytopenia and leukopenia   -Thrombocytopenia and leukopenia due to are thought to be secondary to his chronic liver disease and splenomegaly. - Abdominal ultrasound (11/20/2021): Marked splenomegaly with volume of 1591 cc (18.6 cm length) - Most recent labs show WBC and platelets at baseline (WBC 2.7/ALC 0.2, platelets 91) - Hospitalized for SBP in November 2022.  No other recent infections. - Patient denies frequent infections.  Reports easy bruising but denies significant bleeding events.  - PLAN: Continue to monitor with repeat CBCs.   4.  B12 deficiency   - Anemia mildly macrocytic (in part due to liver disease) - Patient is receiving monthly B12 injections  - Most recent labs (12/30/2021): Vitamin B12 normal at 991, methylmalonic acid elevated at 552 - Note that methylmalonic acid may be falsely elevated in the setting of ESRD - PLAN: Continue monthly B12 injections.  Reassess in 3 months with repeat labs.  5.  End-stage renal disease:   -He gets dialysis Monday-Wednesday-Friday -He receives Epogen injections at his dialysis.   6. Cirrhosis / End-stage Liver Disease   - Receives paracentesis every 2 weeks - Follows regularly with gastroenterology   7.  Weight loss   - Patient had previously lost 25 pounds in 2 months, which he attributes to poor appetite and not eating  well - Weight today is stable compared to last visit. - PLAN: Encouraged patient to drink Ensure at least once daily.  We will continue to follow weight at next visit.  If he continues to have unintentional weight loss would consider referral to dietitian and possible imaging studies if indicated.   All questions were answered. The patient knows to call the clinic with any problems, questions or concerns.  Medical decision making: Moderate   Time spent on visit: I spent 22 minutes counseling the patient face to face. The total time spent in the appointment was 33 minutes and more than 50% was on counseling.   Harriett Rush, PA-C  01/08/2022 11:04 AM

## 2022-01-09 ENCOUNTER — Ambulatory Visit: Payer: Medicare Other

## 2022-01-13 ENCOUNTER — Ambulatory Visit: Payer: Self-pay | Admitting: *Deleted

## 2022-01-13 ENCOUNTER — Ambulatory Visit: Payer: Medicare Other | Admitting: Physician Assistant

## 2022-01-15 ENCOUNTER — Encounter (HOSPITAL_COMMUNITY): Payer: Self-pay

## 2022-01-15 ENCOUNTER — Ambulatory Visit (HOSPITAL_COMMUNITY)
Admission: RE | Admit: 2022-01-15 | Discharge: 2022-01-15 | Disposition: A | Discharge status: Home / Self Care | Payer: Medicare Other | Source: Ambulatory Visit | Attending: Internal Medicine | Admitting: Internal Medicine

## 2022-01-15 VITALS — BP 123/62 | HR 83 | Temp 98.3°F | Resp 18

## 2022-01-15 DIAGNOSIS — R188 Other ascites: Secondary | ICD-10-CM | POA: Diagnosis present

## 2022-01-15 DIAGNOSIS — K7030 Alcoholic cirrhosis of liver without ascites: Secondary | ICD-10-CM | POA: Diagnosis not present

## 2022-01-15 LAB — BODY FLUID CELL COUNT WITH DIFFERENTIAL
Eos, Fluid: 0 %
Lymphs, Fluid: 78 %
Monocyte-Macrophage-Serous Fluid: 13 % — ABNORMAL LOW (ref 50–90)
Neutrophil Count, Fluid: 9 % (ref 0–25)
Total Nucleated Cell Count, Fluid: 108 cu mm (ref 0–1000)

## 2022-01-15 LAB — GRAM STAIN: Gram Stain: NONE SEEN

## 2022-01-15 NOTE — Procedures (Signed)
PROCEDURE SUMMARY:  Successful US guided paracentesis from LLQ.  Yielded 800cc of turbid fluid.  No immediate complications.  Pt tolerated well.   Specimen was sent for labs.  EBL < 33m  Shakena Callari PA-C 01/15/2022 12:58 PM

## 2022-01-15 NOTE — Progress Notes (Signed)
PT tolerated left sided paracentesis procedure well today and 800 mL of cloudy yellow ascites removed and labs collected and sent to lab for processing. PT verbalized understanding of discharge instructions and left via wheelchair with family with no acute distress noted.

## 2022-01-20 ENCOUNTER — Ambulatory Visit (INDEPENDENT_AMBULATORY_CARE_PROVIDER_SITE_OTHER): Payer: Medicare Other | Admitting: Urology

## 2022-01-20 DIAGNOSIS — R339 Retention of urine, unspecified: Secondary | ICD-10-CM | POA: Diagnosis not present

## 2022-01-20 LAB — CULTURE, BODY FLUID W GRAM STAIN -BOTTLE: Culture: NO GROWTH

## 2022-01-20 NOTE — Patient Instructions (Signed)

## 2022-01-20 NOTE — Progress Notes (Signed)
Cath Change/ Replacement  Patient is present today for a catheter change due to urinary retention.  87m of water was removed from the balloon, a 16FR foley cath was removed with out difficulty.  Patient was cleaned and prepped in a sterile fashion with betadine. A 16 FR foley cath was replaced into the bladder no complications were noted Urine return was noted 341mand urine was yellow in color. The balloon was filled with 1057mf sterile water. A leg bag was attached for drainage. Patient was given proper instruction on catheter care.    Performed by: Kristilyn Coltrane LPN   Follow up: keep scheduled NV

## 2022-01-21 LAB — PATHOLOGIST SMEAR REVIEW

## 2022-02-02 ENCOUNTER — Encounter (HOSPITAL_COMMUNITY): Payer: Self-pay

## 2022-02-02 ENCOUNTER — Other Ambulatory Visit: Payer: Self-pay

## 2022-02-02 ENCOUNTER — Observation Stay (HOSPITAL_COMMUNITY): Payer: Medicare Other

## 2022-02-02 ENCOUNTER — Inpatient Hospital Stay (HOSPITAL_COMMUNITY)
Admission: EM | Admit: 2022-02-02 | Discharge: 2022-02-07 | DRG: 441 | Disposition: A | Discharge status: Skilled Nursing Facility | Payer: Medicare Other | Attending: Internal Medicine | Admitting: Internal Medicine

## 2022-02-02 ENCOUNTER — Emergency Department (HOSPITAL_COMMUNITY): Payer: Medicare Other

## 2022-02-02 DIAGNOSIS — E538 Deficiency of other specified B group vitamins: Secondary | ICD-10-CM | POA: Diagnosis present

## 2022-02-02 DIAGNOSIS — M109 Gout, unspecified: Secondary | ICD-10-CM | POA: Diagnosis present

## 2022-02-02 DIAGNOSIS — N2581 Secondary hyperparathyroidism of renal origin: Secondary | ICD-10-CM | POA: Diagnosis present

## 2022-02-02 DIAGNOSIS — Y846 Urinary catheterization as the cause of abnormal reaction of the patient, or of later complication, without mention of misadventure at the time of the procedure: Secondary | ICD-10-CM | POA: Diagnosis present

## 2022-02-02 DIAGNOSIS — D62 Acute posthemorrhagic anemia: Secondary | ICD-10-CM | POA: Diagnosis present

## 2022-02-02 DIAGNOSIS — I4891 Unspecified atrial fibrillation: Secondary | ICD-10-CM | POA: Diagnosis present

## 2022-02-02 DIAGNOSIS — Z992 Dependence on renal dialysis: Secondary | ICD-10-CM

## 2022-02-02 DIAGNOSIS — Z794 Long term (current) use of insulin: Secondary | ICD-10-CM

## 2022-02-02 DIAGNOSIS — R531 Weakness: Secondary | ICD-10-CM

## 2022-02-02 DIAGNOSIS — Z800 Family history of malignant neoplasm of digestive organs: Secondary | ICD-10-CM

## 2022-02-02 DIAGNOSIS — K219 Gastro-esophageal reflux disease without esophagitis: Secondary | ICD-10-CM | POA: Diagnosis present

## 2022-02-02 DIAGNOSIS — D500 Iron deficiency anemia secondary to blood loss (chronic): Secondary | ICD-10-CM | POA: Diagnosis present

## 2022-02-02 DIAGNOSIS — W19XXXA Unspecified fall, initial encounter: Secondary | ICD-10-CM | POA: Diagnosis present

## 2022-02-02 DIAGNOSIS — K2980 Duodenitis without bleeding: Secondary | ICD-10-CM | POA: Diagnosis present

## 2022-02-02 DIAGNOSIS — G9341 Metabolic encephalopathy: Secondary | ICD-10-CM | POA: Diagnosis present

## 2022-02-02 DIAGNOSIS — T83511A Infection and inflammatory reaction due to indwelling urethral catheter, initial encounter: Secondary | ICD-10-CM | POA: Diagnosis present

## 2022-02-02 DIAGNOSIS — J9610 Chronic respiratory failure, unspecified whether with hypoxia or hypercapnia: Secondary | ICD-10-CM | POA: Diagnosis present

## 2022-02-02 DIAGNOSIS — E785 Hyperlipidemia, unspecified: Secondary | ICD-10-CM | POA: Diagnosis present

## 2022-02-02 DIAGNOSIS — Z87891 Personal history of nicotine dependence: Secondary | ICD-10-CM

## 2022-02-02 DIAGNOSIS — R197 Diarrhea, unspecified: Secondary | ICD-10-CM | POA: Diagnosis present

## 2022-02-02 DIAGNOSIS — K3189 Other diseases of stomach and duodenum: Secondary | ICD-10-CM | POA: Diagnosis present

## 2022-02-02 DIAGNOSIS — I132 Hypertensive heart and chronic kidney disease with heart failure and with stage 5 chronic kidney disease, or end stage renal disease: Secondary | ICD-10-CM | POA: Diagnosis present

## 2022-02-02 DIAGNOSIS — E1122 Type 2 diabetes mellitus with diabetic chronic kidney disease: Secondary | ICD-10-CM | POA: Diagnosis present

## 2022-02-02 DIAGNOSIS — K7030 Alcoholic cirrhosis of liver without ascites: Secondary | ICD-10-CM

## 2022-02-02 DIAGNOSIS — I10 Essential (primary) hypertension: Secondary | ICD-10-CM | POA: Diagnosis present

## 2022-02-02 DIAGNOSIS — D61818 Other pancytopenia: Secondary | ICD-10-CM | POA: Diagnosis present

## 2022-02-02 DIAGNOSIS — K7031 Alcoholic cirrhosis of liver with ascites: Secondary | ICD-10-CM | POA: Diagnosis present

## 2022-02-02 DIAGNOSIS — I8511 Secondary esophageal varices with bleeding: Secondary | ICD-10-CM | POA: Diagnosis present

## 2022-02-02 DIAGNOSIS — N186 End stage renal disease: Secondary | ICD-10-CM | POA: Diagnosis present

## 2022-02-02 DIAGNOSIS — K766 Portal hypertension: Secondary | ICD-10-CM | POA: Diagnosis present

## 2022-02-02 DIAGNOSIS — Z803 Family history of malignant neoplasm of breast: Secondary | ICD-10-CM

## 2022-02-02 DIAGNOSIS — R296 Repeated falls: Secondary | ICD-10-CM | POA: Diagnosis present

## 2022-02-02 DIAGNOSIS — Z833 Family history of diabetes mellitus: Secondary | ICD-10-CM

## 2022-02-02 DIAGNOSIS — K7682 Hepatic encephalopathy: Secondary | ICD-10-CM | POA: Diagnosis not present

## 2022-02-02 DIAGNOSIS — K922 Gastrointestinal hemorrhage, unspecified: Secondary | ICD-10-CM | POA: Diagnosis present

## 2022-02-02 DIAGNOSIS — Z79899 Other long term (current) drug therapy: Secondary | ICD-10-CM

## 2022-02-02 DIAGNOSIS — N390 Urinary tract infection, site not specified: Secondary | ICD-10-CM | POA: Diagnosis present

## 2022-02-02 DIAGNOSIS — I5032 Chronic diastolic (congestive) heart failure: Secondary | ICD-10-CM | POA: Diagnosis present

## 2022-02-02 DIAGNOSIS — B9620 Unspecified Escherichia coli [E. coli] as the cause of diseases classified elsewhere: Secondary | ICD-10-CM | POA: Diagnosis present

## 2022-02-02 DIAGNOSIS — Z823 Family history of stroke: Secondary | ICD-10-CM

## 2022-02-02 LAB — CBC WITH DIFFERENTIAL/PLATELET
Abs Immature Granulocytes: 0.01 10*3/uL (ref 0.00–0.07)
Basophils Absolute: 0 10*3/uL (ref 0.0–0.1)
Basophils Relative: 1 %
Eosinophils Absolute: 0 10*3/uL (ref 0.0–0.5)
Eosinophils Relative: 1 %
HCT: 22.9 % — ABNORMAL LOW (ref 39.0–52.0)
Hemoglobin: 7.1 g/dL — ABNORMAL LOW (ref 13.0–17.0)
Immature Granulocytes: 0 %
Lymphocytes Relative: 6 %
Lymphs Abs: 0.2 10*3/uL — ABNORMAL LOW (ref 0.7–4.0)
MCH: 33 pg (ref 26.0–34.0)
MCHC: 31 g/dL (ref 30.0–36.0)
MCV: 106.5 fL — ABNORMAL HIGH (ref 80.0–100.0)
Monocytes Absolute: 0.2 10*3/uL (ref 0.1–1.0)
Monocytes Relative: 5 %
Neutro Abs: 3 10*3/uL (ref 1.7–7.7)
Neutrophils Relative %: 87 %
Platelets: 111 10*3/uL — ABNORMAL LOW (ref 150–400)
RBC: 2.15 MIL/uL — ABNORMAL LOW (ref 4.22–5.81)
RDW: 21.1 % — ABNORMAL HIGH (ref 11.5–15.5)
WBC: 3.5 10*3/uL — ABNORMAL LOW (ref 4.0–10.5)
nRBC: 0 % (ref 0.0–0.2)

## 2022-02-02 LAB — URINALYSIS, ROUTINE W REFLEX MICROSCOPIC
Bilirubin Urine: NEGATIVE
Glucose, UA: NEGATIVE mg/dL
Ketones, ur: NEGATIVE mg/dL
Nitrite: NEGATIVE
Protein, ur: 100 mg/dL — AB
Specific Gravity, Urine: 1.011 (ref 1.005–1.030)
WBC, UA: >50 WBC/hpf — ABNORMAL HIGH (ref 0–5)
pH: 5 (ref 5.0–8.0)

## 2022-02-02 LAB — COMPREHENSIVE METABOLIC PANEL
ALT: 32 U/L (ref 0–44)
AST: 46 U/L — ABNORMAL HIGH (ref 15–41)
Albumin: 2.8 g/dL — ABNORMAL LOW (ref 3.5–5.0)
Alkaline Phosphatase: 144 U/L — ABNORMAL HIGH (ref 38–126)
Anion gap: 12 (ref 5–15)
BUN: 34 mg/dL — ABNORMAL HIGH (ref 8–23)
CO2: 25 mmol/L (ref 22–32)
Calcium: 9.2 mg/dL (ref 8.9–10.3)
Chloride: 96 mmol/L — ABNORMAL LOW (ref 98–111)
Creatinine, Ser: 4.43 mg/dL — ABNORMAL HIGH (ref 0.61–1.24)
GFR, Estimated: 13 mL/min — ABNORMAL LOW (ref >60)
Glucose, Bld: 158 mg/dL — ABNORMAL HIGH (ref 70–99)
Potassium: 4.2 mmol/L (ref 3.5–5.1)
Sodium: 133 mmol/L — ABNORMAL LOW (ref 135–145)
Total Bilirubin: 1.2 mg/dL (ref 0.3–1.2)
Total Protein: 6.6 g/dL (ref 6.5–8.1)

## 2022-02-02 LAB — CBG MONITORING, ED: Glucose-Capillary: 155 mg/dL — ABNORMAL HIGH (ref 70–99)

## 2022-02-02 LAB — HEPATITIS B SURFACE ANTIGEN: Hepatitis B Surface Ag: NONREACTIVE

## 2022-02-02 LAB — MAGNESIUM: Magnesium: 1.7 mg/dL (ref 1.7–2.4)

## 2022-02-02 LAB — GLUCOSE, CAPILLARY: Glucose-Capillary: 139 mg/dL — ABNORMAL HIGH (ref 70–99)

## 2022-02-02 LAB — AMMONIA: Ammonia: 65 umol/L — ABNORMAL HIGH (ref 9–35)

## 2022-02-02 MED ORDER — METOPROLOL SUCCINATE ER 25 MG PO TB24
25.0000 mg | ORAL_TABLET | ORAL | Status: DC
  Filled 2022-02-02: qty 1

## 2022-02-02 MED ORDER — INSULIN ASPART 100 UNIT/ML IJ SOLN
0.0000 [IU] | Freq: Three times a day (TID) | INTRAMUSCULAR | Status: DC
  Administered 2022-02-04: 2 [IU] via SUBCUTANEOUS
  Administered 2022-02-04: 3 [IU] via SUBCUTANEOUS
  Administered 2022-02-04: 2 [IU] via SUBCUTANEOUS
  Administered 2022-02-05: 3 [IU] via SUBCUTANEOUS
  Administered 2022-02-05 – 2022-02-06 (×2): 2 [IU] via SUBCUTANEOUS
  Administered 2022-02-06: 3 [IU] via SUBCUTANEOUS
  Administered 2022-02-06: 2 [IU] via SUBCUTANEOUS
  Administered 2022-02-07: 5 [IU] via SUBCUTANEOUS
  Administered 2022-02-07: 2 [IU] via SUBCUTANEOUS

## 2022-02-02 MED ORDER — FAMOTIDINE 20 MG PO TABS
20.0000 mg | ORAL_TABLET | Freq: Two times a day (BID) | ORAL | Status: DC
  Administered 2022-02-02: 20 mg via ORAL
  Filled 2022-02-02: qty 1

## 2022-02-02 MED ORDER — METOPROLOL SUCCINATE ER 25 MG PO TB24: 25.0000 mg | ORAL_TABLET | ORAL | Status: DC

## 2022-02-02 MED ORDER — PIPERACILLIN-TAZOBACTAM 3.375 G IVPB 30 MIN
3.3750 g | Freq: Once | INTRAVENOUS | Status: AC
  Administered 2022-02-02: 3.375 g via INTRAVENOUS
  Filled 2022-02-02: qty 50

## 2022-02-02 MED ORDER — INSULIN ASPART 100 UNIT/ML IJ SOLN
0.0000 [IU] | Freq: Every day | INTRAMUSCULAR | Status: DC
  Administered 2022-02-05 – 2022-02-06 (×2): 3 [IU] via SUBCUTANEOUS

## 2022-02-02 MED ORDER — ALLOPURINOL 300 MG PO TABS
300.0000 mg | ORAL_TABLET | Freq: Every day | ORAL | Status: DC
  Administered 2022-02-03 – 2022-02-07 (×4): 300 mg via ORAL
  Filled 2022-02-02 (×4): qty 1

## 2022-02-02 MED ORDER — SPIRONOLACTONE 25 MG PO TABS: 50.0000 mg | ORAL_TABLET | Freq: Every morning | ORAL | Status: DC

## 2022-02-02 MED ORDER — DILTIAZEM HCL ER 120 MG PO CP24
120.0000 mg | ORAL_CAPSULE | Freq: Every day | ORAL | Status: DC
  Administered 2022-02-02: 120 mg via ORAL
  Filled 2022-02-02 (×4): qty 1

## 2022-02-02 MED ORDER — METOPROLOL SUCCINATE ER 25 MG PO TB24
25.0000 mg | ORAL_TABLET | Freq: Every day | ORAL | Status: DC
  Administered 2022-02-02 – 2022-02-06 (×5): 25 mg via ORAL
  Filled 2022-02-02 (×5): qty 1

## 2022-02-02 MED ORDER — LACTULOSE 10 GM/15ML PO SOLN: 10.0000 g | Freq: Every day | ORAL | Status: DC

## 2022-02-02 MED ORDER — HEPARIN SODIUM (PORCINE) 5000 UNIT/ML IJ SOLN
5000.0000 [IU] | Freq: Three times a day (TID) | INTRAMUSCULAR | Status: DC
  Administered 2022-02-02 – 2022-02-03 (×2): 5000 [IU] via SUBCUTANEOUS
  Filled 2022-02-02 (×2): qty 1

## 2022-02-02 MED ORDER — LORATADINE 10 MG PO TABS
10.0000 mg | ORAL_TABLET | Freq: Every day | ORAL | Status: DC
  Administered 2022-02-02 – 2022-02-07 (×5): 10 mg via ORAL
  Filled 2022-02-02 (×6): qty 1

## 2022-02-02 MED ORDER — RIFAXIMIN 550 MG PO TABS
550.0000 mg | ORAL_TABLET | Freq: Two times a day (BID) | ORAL | Status: DC
  Administered 2022-02-02 – 2022-02-07 (×9): 550 mg via ORAL
  Filled 2022-02-02 (×9): qty 1

## 2022-02-02 MED ORDER — LACTULOSE 10 GM/15ML PO SOLN
20.0000 g | Freq: Once | ORAL | Status: AC
  Administered 2022-02-02: 20 g via ORAL
  Filled 2022-02-02: qty 30

## 2022-02-02 MED ORDER — COLCHICINE 0.6 MG PO TABS: 0.6000 mg | ORAL_TABLET | Freq: Two times a day (BID) | ORAL | Status: DC

## 2022-02-02 NOTE — Assessment & Plan Note (Addendum)
Stable and compensated.  On Lasix 80 mg daily, also on HD.  Last echo 2021 shows EF of 55 to 60% with mild LVH. - Fluid management Per HD

## 2022-02-02 NOTE — Assessment & Plan Note (Addendum)
Hemoglobin 6.4, baseline ~8. -transfuse 2 units PRBC with hemodialysis - hold subcutaneous heparin due to drop in Hg -05/22/21 EGD--portal HTN gastropathy, denuded esophageal mucosa -01/19/21 colonscopy--large non bleeding rectal varices; polyp prox desc colon, inflammatory polyp splenic flexure

## 2022-02-02 NOTE — H&P (Addendum)
History and Physical    Jeffrey [name]:[id number] DOB: [birth year] DOA: 02/02/2022  PCP: Monico Blitz, MD   Patient coming from: Home  I have personally briefly reviewed patient's old medical records in [place]  Chief Complaint: Generalized weakness, falls  HPI: Jeffrey [name] is a 76 y.o. 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  Patient was brought to the ED with reports of weakness over the past few weeks and multiple falls recently.  At the time of my evaluation, patient is alone,  awake, but appears lethargic, slightly drowsy, answers a few questions appropriately, but also appears slightly confused.   I talked to patient's spouse on the phone, she confirms drowsiness, and intermittent confusion over the past few days.  She reports over the past 2 weeks, patient has been having at least 2-3 watery stools every day, so she did not give him his lactulose the past 2 weeks, but he was getting his rifaximin.  At baseline patient has 1-2 bowel movements a day and usually not watery, so and she was concerned for C. difficile infection.  Patient is also taking colchicine for gout, he has been on it for about 6 weeks now.    Patient's HD schedule is Monday Wednesday Friday, he did not get dialyzed today, instead he came to the ED.  Patient denies any pain to me.  Reports a chronic mild cough that is unchanged.  He does make some urine, he denies any urinary symptoms at this time.    Stopped drinking alcohol 2014 per spouse.  ED Course: Tmax 98.8.  Heart rate 69-75.  Respiratory rate 12-20.  Blood pressure systolic 314-970.  O2 sats greater than 93% on 3 L. Ammonia level 65. Hemoglobin 7.1. UA with large leukocytes many bacteria greater than 50 WBCs. 2 view chest x-ray-gross cardiomegaly  with pulmonary vascular prominence and layering bilateral pleural effusions similar to prior exam. Zosyn started for UTI.  Hospitalist to admit.  Review of Systems: As per HPI all other systems reviewed and negative.  Past Medical History:  Diagnosis Date   A-fib [facility])    when initially stating dialysis january 2021 at [facility] per spouse; never heard anything else about it   Alcoholic cirrhosis [facility])    patient reports completing hep a and b vaccines in 2006   Anemia    has had 3 units of prbcs 2013   Anxiety    Arthritis    Asymptomatic gallstones    Ultrasound in 2006   B12 deficiency 11/21/2020   C. difficile colitis    Cholelithiasis    Chronic kidney disease    Dialysis M/W/F/Sa   Depression    Diabetes ([place])    Duodenal ulcer with hemorrhage    per patient in 2006 or 2007, records have been requested   Dyspnea    low oxygen level -  has O2 2 L all day   Esophageal varices ([place])    see PSH   GERD (gastroesophageal reflux disease)    Heart burn    History of alcohol abuse    quit 05/2013   HOH (hard of hearing)    Hypertension    Liver cirrhosis ([place])    ETOH   SBP (spontaneous bacterial peritonitis) ([place])    2020, November 2021.    Splenomegaly    Ultrasound in 2006  Past Surgical History:  Procedure Laterality Date   AGILE CAPSULE N/A 09/20/2019   Procedure: AGILE CAPSULE;  Surgeon: Daneil Dolin, MD;  [facility];  Service: Endoscopy;  Laterality: N/A;   APPENDECTOMY     AV FISTULA PLACEMENT Left 08/17/2019   Procedure: ARTERIOVENOUS (AV) FISTULA CREATION VERSES ARTERIOVENOUS GRAFT;  Surgeon: [name], MD;  Location: [facility];  Service: Vascular;  Laterality: Left;   BASCILIC VEIN TRANSPOSITION Left 09/28/2019   Procedure: LEFT SECOND STAGE [place];  Surgeon: [name], MD;  Location: [place];  Service: Vascular;  Laterality: Left;   BIOPSY  02/03/2018   Procedure: BIOPSY;  Surgeon: Daneil Dolin, MD;   [facility];  Service: Endoscopy;;  bx of gastric polyps   BIOPSY  09/19/2019   Procedure: BIOPSY;  Surgeon: Danie Binder, MD;  [facility];  Service: Endoscopy;;   BIOPSY  01/16/2021   Procedure: BIOPSY;  Surgeon: Daneil Dolin, MD;  [facility];  Service: Endoscopy;;   CATARACT EXTRACTION Right    COLONOSCOPY  03/10/2005   Rectal polyp as described above, removed with snare. Left sided  diverticula. The remainder of the colonic mucosa appeared normal. Inflammed polyp on path.   COLONOSCOPY  04/1999   Dr. Thea Silversmith polyps removed,  Path showed hyperplastic   COLONOSCOPY  10/2015   Dr. Britta Mccreedy: diverticulosis, single sessile tubular adenoma 3-59m in size removed from descending colon.    COLONOSCOPY WITH ESOPHAGOGASTRODUODENOSCOPY (EGD)  03/31/2012   RWJX:[id number]. Colonic diverticulosis. tubular adenoma colon   COLONOSCOPY WITH ESOPHAGOGASTRODUODENOSCOPY (EGD)  06/2019   FORSYTH: small esophageal varices without high risk stigmata, single large AVM on lesser curvature in gastric body s/p APC ablation, gastric antral and duodenal bulb polyposis. No significant source to explain transfusion dependent anemia. Colonoscopy with portal colopathy and diffuse edema, changes of Cdiff colitis on colonoscopy   COLONOSCOPY WITH PROPOFOL N/A 01/19/2021   Surgeon: CDaryel November MD; 15 mm polyp in descending colon removed piecemeal, 6 mm polyp in ascending colon and 12 mm polyp in sigmoid colon not removed due to high bleeding risk, benign polypoid lesion at splenic flexure, diverticulosis in sigmoid and descending colon, rectal varices.   COLONOSCOPY WITH PROPOFOL N/A 01/16/2021   Surgeon: RDaneil Dolin MD;  Portal colopathy, large rectal varices, polypoid mass at splenic flexure biopsy, two 1 cm left colon polyps not removed, left-sided diverticulosis.  Reported patient had bleeding from splenic flexure lesion.  Pathology with acutely inflamed colonic  mucosa with localized ulceration and prominent reactive changes.   ESOPHAGEAL BANDING N/A 02/03/2018   Procedure: ESOPHAGEAL BANDING;  Surgeon: RDaneil Dolin MD;  [facility];  Service: Endoscopy;  Laterality: N/A;   ESOPHAGEAL BANDING  01/16/2021   Procedure: ESOPHAGEAL BANDING;  Surgeon: RDaneil Dolin MD;  [facility];  Service: Endoscopy;;   ESOPHAGOGASTRODUODENOSCOPY  01/15/2005   Three columns grade 1 to 2 esophageal varices, otherwise normal esophageal mucosa.  Esophagus was not manipulated otherwise./Nodularity of the antrum with overlying erosions, nonspecific finding. Path showed rare H.pylori   ESOPHAGOGASTRODUODENOSCOPY  09/2008   Dr. RGaylene Brookscolumns of grade 2-3 esoph varices, only one column was prominent. Portal gastropathy, multiple gastrc polyps at antrum, two were 2cm with black eschar, bulbar polyps, bulbar erosions   ESOPHAGOGASTRODUODENOSCOPY  11/2004   Dr. FBrantley Stage3 esoph varices   ESOPHAGOGASTRODUODENOSCOPY  03/31/2012   RMR: 4 columns(3-GR 2, 1-GR1) non-bleeding esophageal varices, portal gastropathy,  small HH, early GAVE, multiple gastric polyps    ESOPHAGOGASTRODUODENOSCOPY (EGD) WITH PROPOFOL N/A 02/03/2018   Dr. Gala Romney: Esophageal varices, 3 columns grade 1-2.  Portal hypertensive gastropathy.  Multiple gastric polyps, biopsy consistent with hyperplastic.   ESOPHAGOGASTRODUODENOSCOPY (EGD) WITH PROPOFOL N/A 09/19/2019   Fields: grade I and II esophageal varcies, mild portal hypertensive gastropathy, moderate gastritis but no H. pylori, single hyperplastic gastric polyp removed, obvious source for melena/transfusion dependent anemia not identified, may be due to friable gastric and duodenal mucosa in the setting of portal hypertension   ESOPHAGOGASTRODUODENOSCOPY (EGD) WITH PROPOFOL N/A 01/16/2021   Surgeon: Daneil Dolin, MD; Portal hypertensive gastropathy, grade 2 esophageal varices with bleeding stigmata s/p banding x4,  slight nodularity and congestion involving bulb and second portion of duodenum, nonspecific.  Recommended repeat EGD in 4 weeks.   ESOPHAGOGASTRODUODENOSCOPY (EGD) WITH PROPOFOL N/A 05/22/2021   stigmata of prior esophageal banding and no significant varices seen. Portal gastropathy. Polypoid gastric and duodenal mucosa with overlying erosions, marked mucosal friability.   GASTRIC VARICES BANDING  03/31/2012   Procedure: GASTRIC VARICES BANDING;  Surgeon: Daneil Dolin, MD;  [facility];  Service: Endoscopy;  Laterality: N/A;   GIVENS CAPSULE STUDY N/A 09/21/2019   Poor study with a lot of debris obstructing much of the view of the first approximate 3 hours out of 6 hours.  No obvious source of bleeding identified.  Sequela of bleeding and old blood seen in the form of"whisps" of blood, blood flecks mostly toward the end of the study.   HEMOSTASIS CLIP PLACEMENT  01/19/2021   Procedure: HEMOSTASIS CLIP PLACEMENT;  Surgeon: [name], MD;  Location: [facility] ENDOSCOPY;  Service: Gastroenterology;;   IR REMOVAL TUN CV CATH W/O FL  02/15/2020   POLYPECTOMY  09/19/2019   Procedure: POLYPECTOMY;  Surgeon: Danie Binder, MD;  [facility];  Service: Endoscopy;;   POLYPECTOMY  01/19/2021   Procedure: POLYPECTOMY;  Surgeon: [name], MD;  Location: [facility] ENDOSCOPY;  Service: Gastroenterology;;   UMBILICAL HERNIA REPAIR  2017   exploratory laparotomy, abdominal washout     reports that he quit smoking about 36 years ago. His smoking use included cigarettes. He has a 50.00 pack-year smoking history. He has never used smokeless tobacco. He reports that he does not drink alcohol and does not use drugs.  Allergies  Allergen Reactions   Tape Itching, Rash and Other (See Comments)    Tears skin    Family History  Problem Relation Age of Onset   Colon cancer Father 57       deceased age 16   Breast cancer Sister        deceased   Diabetes Sister    Stroke Mother     Healthy Son    Healthy Daughter    Lung cancer Neg Hx    Ovarian cancer Neg Hx     Prior to Admission medications   Medication Sig Start Date End Date Taking? Authorizing Provider  acetaminophen (TYLENOL) 650 MG CR tablet Take 650 mg by mouth every 8 (eight) hours as needed for pain.   Yes [provider]  Alcohol Swabs (ALCOHOL PREP) 70 % PADS Apply 1 each topically 3 (three) times daily. 01/31/20  Yes [provider]  allopurinol (ZYLOPRIM) 300 MG tablet Take 300 mg by mouth daily. 12/11/21  Yes [provider]  ALPRAZolam Duanne Moron) 0.5 MG tablet Take 0.5 mg by mouth See admin instructions. Take one tablet (0.5 mg)  by mouth on Monday, Wednesday, Friday before dialysis, may also take one tablet (0.5 mg) daily as needed for anxiety. 09/21/20  Yes [provider]  Ascorbic Acid (VITAMIN C) 1000 MG tablet Take 1,000 mg by mouth in the morning.   Yes [provider]  calcium carbonate (TUMS - DOSED IN MG ELEMENTAL CALCIUM) 500 MG chewable tablet Chew 1 tablet by mouth 3 (three) times daily with meals.   Yes [provider]  Calcium Carbonate-Simethicone (ALKA-SELTZER HEARTBURN + GAS) 750-80 MG CHEW Chew 1-2 tablets by mouth 2 (two) times daily as needed (acid reflux).   Yes [provider]  cetirizine (ZYRTEC) 10 MG tablet Take 10 mg by mouth daily. 06/24/21  Yes [provider]  cholecalciferol (VITAMIN D3) 25 MCG (1000 UNIT) tablet Take 1,000 Units by mouth daily.   Yes [provider]  colchicine 0.6 MG tablet Take 0.6 mg by mouth 2 (two) times daily. 12/11/21  Yes [provider]  CONSTULOSE 10 GM/15ML solution Take by mouth. 11/21/21  Yes [provider]  diltiazem (DILACOR XR) 120 MG 24 hr capsule Take 120 mg by mouth daily. 11/19/21  Yes [provider]  famotidine (PEPCID) 20 MG tablet Take 1 tablet (20 mg total) by mouth 2 (two) times daily. 07/30/21 07/30/22 Yes Barton Dubois, MD   furosemide (LASIX) 40 MG tablet Take 80 mg by mouth daily. 12/29/19  Yes [provider]  gabapentin (NEURONTIN) 100 MG capsule TAKE 1 CAPSULE BY MOUTH AT BEDTIME Patient taking differently: Take 100 mg by mouth 3 (three) times daily. 05/28/20  Yes Waynetta Sandy, MD  Garlic 0092 MG CAPS Take 1,000 mg by mouth daily.   Yes [provider]  HUMALOG KWIKPEN 100 UNIT/ML KwikPen 10-50 Units 3 (three) times daily. Sliding scale 04/23/21  Yes [provider]  hydrOXYzine (ATARAX/VISTARIL) 25 MG tablet Take 25 mg by mouth 3 (three) times daily. For itching 11/23/20  Yes [provider]  iron polysaccharides (NIFEREX) 150 MG capsule Take 150 mg by mouth daily with supper.   Yes [provider]  metoprolol succinate (TOPROL-XL) 25 MG 24 hr tablet Take 1 tablet (25 mg total) by mouth See admin instructions. Take one tablet (25 mg) by mouth at bedtime every night and take one tablet (25 mg) in morning on  Sun, Tues, Thurs and Sat (non-dialysis days) 04/15/21  Yes Johnson, Clanford L, MD  mirtazapine (REMERON) 30 MG tablet Take 1 tablet (30 mg total) by mouth at bedtime. Patient taking differently: Take 30 mg by mouth daily with supper. 02/10/20  Yes Roxan Hockey, MD  Multiple Vitamin (MULTIVITAMIN WITH MINERALS) TABS tablet Take 1 tablet by mouth in the morning.    Yes [provider]  omega-3 acid ethyl esters (LOVAZA) 1 g capsule Take 2 g by mouth 2 (two) times daily.  04/07/19  Yes [provider]  ondansetron (ZOFRAN) 4 MG tablet Take 4 mg by mouth every 8 (eight) hours as needed for nausea or vomiting.   Yes [provider]  oxyCODONE-acetaminophen (PERCOCET/ROXICET) 5-325 MG tablet Take 1 tablet by mouth every 4 (four) hours as needed for moderate pain. 02/27/20  Yes [provider]  Ped Vitamins ACD Fl-Iron (TRI-VIT/FLUORIDE/IRON PO) Take 1 tablet by mouth daily. 08/01/19  Yes [provider]  pravastatin  (PRAVACHOL) 20 MG tablet Take 20 mg by mouth in the morning.    Yes [provider]  Probiotic Product (PROBIOTIC DAILY PO) Take 1 tablet by mouth in  the morning.    Yes [provider]  spironolactone (ALDACTONE) 50 MG tablet Take 50 mg by mouth in the morning.    Yes [provider]  sulfamethoxazole-trimethoprim (BACTRIM DS) 800-160 MG tablet Take 1 tablet by mouth once a week. Take every Friday after dialysis. 09/23/21  Yes Erenest Rasher, PA-C  thiamine (VITAMIN B-1) 100 MG tablet Take 100 mg by mouth daily. 08/27/20  Yes [provider]  tiZANidine (ZANAFLEX) 2 MG tablet Take 2 mg by mouth 2 (two) times daily. 08/20/21  Yes [provider]  TRESIBA FLEXTOUCH 200 UNIT/ML FlexTouch Pen Inject 120 Units into the skin at bedtime. 04/22/21  Yes [provider]  triamcinolone cream (KENALOG) 0.1 % Apply 1 application topically 2 (two) times daily as needed (dry skin Itching). 12/30/17  Yes [provider]  venlafaxine (EFFEXOR) 75 MG tablet Take 75 mg by mouth 3 (three) times daily with meals.    Yes [provider]  XIFAXAN 550 MG TABS tablet TAKE 1 TABLET BY MOUTH TWICE DAILY Patient taking differently: Take 550 mg by mouth 2 (two) times daily. 02/13/21  Yes Mahala Menghini, PA-C  BD PEN NEEDLE MICRO U/F 32G X 6 MM MISC SMARTSIG:SUB-Q Daily PRN 04/23/21   [provider]  Continuous Blood Gluc Sensor (FREESTYLE LIBRE 2 SENSOR) MISC Apply topically. 10/15/21   [provider]  GLUCAGEN HYPOKIT 1 MG SOLR injection Inject 1 mg into the muscle once as needed for low blood sugar. 11/28/20   [provider]  Milk Thistle 250 MG CAPS Take 250 mg by mouth in the morning, at noon, and at bedtime.    [provider]    Physical Exam: Vitals:   02/02/22 1500 02/02/22 1530 02/02/22 1600 02/02/22 1606  BP: 106/75 111/66 116/74 124/71  Pulse: 72 74 73 73  Resp: '15 17 12 13  '$ Temp:    97.9 F (36.6 C)   TempSrc:    Oral  SpO2: 96% 98% 94% 93%  Weight:      Height:        Constitutional: Mild drowsiness, and lethargy, intermittently closing his eyes, but would awaken to voice,  Vitals:   02/02/22 1500 02/02/22 1530 02/02/22 1600 02/02/22 1606  BP: 106/75 111/66 116/74 124/71  Pulse: 72 74 73 73  Resp: '15 17 12 13  '$ Temp:    97.9 F (36.6 C)  TempSrc:    Oral  SpO2: 96% 98% 94% 93%  Weight:      Height:       Eyes: PERRL, lids and conjunctivae normal ENMT: Mucous membranes are moist.   Neck: normal, supple, no masses, no thyromegaly Respiratory: clear to auscultation bilaterally, no wheezing, no crackles. Normal respiratory effort. No accessory muscle use.  Cardiovascular: Regular rate and rhythm, no murmurs / rubs / gallops. No extremity edema. 2+ pedal pulses. No carotid bruits.  Abdomen: Full, soft, no tenderness, no masses palpated. No hepatosplenomegaly. Bowel sounds positive.  Musculoskeletal: no clubbing / cyanosis. No joint deformity upper and lower extremities.  Skin: Multiple purpura and ecchymosis mostly upper extremities, from falls , no ulcers. No induration Neurologic: No facial asymmetry, speech clear without evidence of aphasia.  Generally weak, but able to move all extremities against gravity, Psychiatric: Occasional confused speech, awake, oriented to person and situation, not oriented to place or time.    Labs on Admission: I have personally reviewed following labs and imaging studies  CBC: Recent Labs  Lab 02/02/22 1327  WBC  3.5*  NEUTROABS 3.0  HGB 7.1*  HCT 22.9*  MCV 106.5*  PLT 681*   Basic Metabolic Panel: Recent Labs  Lab 02/02/22 1327  NA 133*  K 4.2  CL 96*  CO2 25  GLUCOSE 158*  BUN 34*  CREATININE 4.43*  CALCIUM 9.2   GFR: Estimated Creatinine Clearance: 14.4 mL/min (A) (by C-G formula based on SCr of 4.43 mg/dL (H)). Liver Function Tests: Recent Labs  Lab 02/02/22 1327  AST 46*  ALT 32  ALKPHOS 144*  BILITOT 1.2  PROT  6.6  ALBUMIN 2.8*   No results for input(s): "LIPASE", "AMYLASE" in the last 168 hours. Recent Labs  Lab 02/02/22 1346  AMMONIA 65*   CBG: Recent Labs  Lab 02/02/22 1154  GLUCAP 155*   Urine analysis:    Component Value Date/Time   COLORURINE YELLOW 02/02/2022 1327   APPEARANCEUR CLOUDY (A) 02/02/2022 1327   LABSPEC 1.011 02/02/2022 1327   PHURINE 5.0 02/02/2022 1327   GLUCOSEU NEGATIVE 02/02/2022 1327   HGBUR MODERATE (A) 02/02/2022 1327   BILIRUBINUR NEGATIVE 02/02/2022 1327   KETONESUR NEGATIVE 02/02/2022 1327   PROTEINUR 100 (A) 02/02/2022 1327   NITRITE NEGATIVE 02/02/2022 1327   LEUKOCYTESUR LARGE (A) 02/02/2022 1327    Radiological Exams on Admission: DG Chest 2 View  Result Date: 02/02/2022 CLINICAL DATA:  Dialysis, missed dialysis, weakness, multiple falls EXAM: CHEST - 2 VIEW COMPARISON:  08/10/2021 FINDINGS: Gross cardiomegaly. Layering bilateral pleural effusions, similar to prior examination. Pulmonary vascular prominence. Osseous structures unremarkable. IMPRESSION: Gross cardiomegaly with pulmonary vascular prominence and layering bilateral pleural effusions, similar to prior examination. Electronically Signed   By: Delanna Ahmadi M.D.   On: 02/02/2022 15:44    EKG: Independently reviewed.  Sinus rhythm rate 69, QTc prolonged at 508.  No significant change from prior.  Assessment/Plan Principal Problem:   Acute hepatic encephalopathy (HCC) Active Problems:   Generalized weakness   ESRD on dialysis ([place])   Falls   Pancytopenia (HCC)   Diarrhea in adult patient   Essential hypertension, benign   Alcoholic cirrhosis (HCC)   Chronic diastolic HF (heart failure) (HCC)   Iron deficiency anemia due to chronic blood loss   Assessment and Plan: * Acute hepatic encephalopathy ([place]) Appears mildly lethargic and drowsy, with generalized weakness.  Also reports of multiple falls.  Ammonia level elevated at 65, higher than baseline, last check 5 months ago ammonia  was 15.  Has not taking Constulose in 2 weeks due to diarrhea, but has been taking rifaximin.  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  He denies urinary symptoms.  Chest x-ray with chronic findings.  Abdominal without tenderness.  Rules out for sepsis.  Encephalopathy likely hepatic in etiology. -Zosyn given in ED, will hold off on further antibiotics at this time -Lactulose 20 g x 1, then continue home lactulose once daily, and rifaximin. -Obtain head CT -Hold gabapentin, as needed Xanax, hydroxyzine, Percocet, mirtazapine, tizanidine, Effexor  ESRD on dialysis [facility]) Schedule Monday Wednesday Friday.  Missed HD today.  Chest X-ray with stable layering bilateral effusions.  -Please consult nephrology in the morning for HD.  Falls Multiple falls and generalized weakness.  Falls weakness, spouse reports patient did not hit his head. -Obtain head CT with altered mental status, and no recent head CT.  Iron deficiency anemia due to chronic blood loss Hemoglobin 7.1, baseline 7-8. -Trend for now  Chronic diastolic HF (heart failure) (HCC) Stable and compensated.  On Lasix 80  mg daily, also on HD.  Last echo 2021 shows EF of 55 to 60% with mild LVH. -Hold Lasix for now with reports of diarrhea. - Per HD  Essential hypertension, benign Stable. -Resume diltiazem, metoprolol, - Hold Lasix 80 mg daily, spironolactone  Diarrhea in adult patient 2-3 episodes of watery stools over the past 2 weeks.  So has not taken Constulose in 2 weeks also.  History of C. difficile infection.  Afebrile.  Stable mild leukopenia, no abdominal pain.  Was prescribed colchicine about 6 weeks ago for gout, which could have precipitated the diarrhea -Stop colchicine for now -If further concerns for C. difficile diarrhea, will need testing  Pancytopenia ([place]) Stable.  WBC 3.5.  Platelets 111.  Hemoglobin 7.1.   DVT prophylaxis: Heparin Code Status: Full code, confirmed with  patient and spouse Furkan Keenum on the phone. Family Communication: None at bedside.  Talked to patient's spouse Imari Sivertsen on the phone. Disposition Plan: ~ 2 days Consults called: Please consult nephrology in the morning for HD. Admission status: Observation, telemetry  Author: Bethena Roys, MD 02/02/2022 7:49 PM  For on call review [email or web address].

## 2022-02-02 NOTE — Assessment & Plan Note (Addendum)
Multiple falls and generalized weakness.  Falls weakness, spouse reports patient did not hit his head. -CT head no acute findings -PT eval>>SNF

## 2022-02-02 NOTE — Assessment & Plan Note (Addendum)
He presented lethargic with generalized weakness.  Also reports of multiple falls.  Ammonia level elevated at 65>>35 -Lactulose ordered and rifaximin. -head CT--no acute findings -Hold gabapentin, as needed Xanax, hydroxyzine, Percocet, mirtazapine, tizanidine, Effexor

## 2022-02-02 NOTE — ED Triage Notes (Signed)
Pt is dialysis pt and missed dialysis this morning. States he has been weak for weeks and had multiple falls recently.

## 2022-02-02 NOTE — Assessment & Plan Note (Addendum)
Due to liver cirrhosis Overall stable except Hgb Trend CBC

## 2022-02-02 NOTE — Assessment & Plan Note (Addendum)
Stable. -Resume diltiazem, metoprolol, - Hold Lasix 80 mg daily

## 2022-02-02 NOTE — ED Provider Notes (Signed)
[facility] EMERGENCY DEPARTMENT Provider Note   CSN: [id number] Arrival date & time: 02/02/22  1141     History  Chief Complaint  Patient presents with   Weakness    Jeffrey [name] is a 76 y.o. male with history of alcoholic cirrhosis with ascities, anemia d/t chronic blood loss, a fib, ESRD on dialysis MWF, type II DM, hyperlipidemia, CHF and hx of C.diff who presents to the ED for evaluation of generalized weakness with frequent falls that started over the last 3 days.  Patient is accompanied by his wife who states that patient has fallen at about 3-4 times over the weekend due to his legs giving out from him.  Patient states that he feels weak all over, including his arms.  He does have some bruising and mild skin tears on his arms from his falls.  He has no back pain, new back injury.  Denies saddle paresthesia, bladder and bowel dysfunction.  Wife also states that she is concerned patient may have C. difficile again as he has been having very frequent bowel movements with unusual smell.  Wife notes that he has not taken his lactulose for some time.  He was supposed to have dialysis this morning, however they skipped it and came to the emergency department instead. Wife also notes that he is scheduled for iron infusion tomorrow. Patient denies fever, chills, chest pain, shortness of breath, abdominal pain, new nausea or vomiting.   Weakness Associated symptoms: no abdominal pain, no chest pain, no dysuria, no fever, no nausea, no shortness of breath and no vomiting        Home Medications Prior to Admission medications   Medication Sig Start Date End Date Taking? Authorizing Provider  acetaminophen (TYLENOL) 650 MG CR tablet Take 650 mg by mouth every 8 (eight) hours as needed for pain.   Yes [provider]  Alcohol Swabs (ALCOHOL PREP) 70 % PADS Apply 1 each topically 3 (three) times daily. 01/31/20  Yes [provider]  allopurinol (ZYLOPRIM) 300 MG tablet Take 300  mg by mouth daily. 12/11/21  Yes [provider]  ALPRAZolam Duanne Moron) 0.5 MG tablet Take 0.5 mg by mouth See admin instructions. Take one tablet (0.5 mg) by mouth on Monday, Wednesday, Friday before dialysis, may also take one tablet (0.5 mg) daily as needed for anxiety. 09/21/20  Yes [provider]  Ascorbic Acid (VITAMIN C) 1000 MG tablet Take 1,000 mg by mouth in the morning.   Yes [provider]  calcium carbonate (TUMS - DOSED IN MG ELEMENTAL CALCIUM) 500 MG chewable tablet Chew 1 tablet by mouth 3 (three) times daily with meals.   Yes [provider]  Calcium Carbonate-Simethicone (ALKA-SELTZER HEARTBURN + GAS) 750-80 MG CHEW Chew 1-2 tablets by mouth 2 (two) times daily as needed (acid reflux).   Yes [provider]  cetirizine (ZYRTEC) 10 MG tablet Take 10 mg by mouth daily. 06/24/21  Yes [provider]  cholecalciferol (VITAMIN D3) 25 MCG (1000 UNIT) tablet Take 1,000 Units by mouth daily.   Yes [provider]  colchicine 0.6 MG tablet Take 0.6 mg by mouth 2 (two) times daily. 12/11/21  Yes [provider]  CONSTULOSE 10 GM/15ML solution Take by mouth. 11/21/21  Yes [provider]  diltiazem (DILACOR XR) 120 MG 24 hr capsule Take 120 mg by mouth daily. 11/19/21  Yes [provider]  famotidine (PEPCID) 20 MG tablet Take 1 tablet (20 mg total) by mouth 2 (two)  times daily. 07/30/21 07/30/22 Yes Barton Dubois, MD  furosemide (LASIX) 40 MG tablet Take 80 mg by mouth daily. 12/29/19  Yes [provider]  gabapentin (NEURONTIN) 100 MG capsule TAKE 1 CAPSULE BY MOUTH AT BEDTIME Patient taking differently: Take 100 mg by mouth 3 (three) times daily. 05/28/20  Yes Waynetta Sandy, MD  Garlic 1245 MG CAPS Take 1,000 mg by mouth daily.   Yes [provider]  HUMALOG KWIKPEN 100 UNIT/ML KwikPen 10-50 Units 3 (three) times daily. Sliding scale 04/23/21  Yes [provider]   hydrOXYzine (ATARAX/VISTARIL) 25 MG tablet Take 25 mg by mouth 3 (three) times daily. For itching 11/23/20  Yes [provider]  iron polysaccharides (NIFEREX) 150 MG capsule Take 150 mg by mouth daily with supper.   Yes [provider]  metoprolol succinate (TOPROL-XL) 25 MG 24 hr tablet Take 1 tablet (25 mg total) by mouth See admin instructions. Take one tablet (25 mg) by mouth at bedtime every night and take one tablet (25 mg) in morning on  Sun, Tues, Thurs and Sat (non-dialysis days) 04/15/21  Yes Johnson, Clanford L, MD  mirtazapine (REMERON) 30 MG tablet Take 1 tablet (30 mg total) by mouth at bedtime. Patient taking differently: Take 30 mg by mouth daily with supper. 02/10/20  Yes Roxan Hockey, MD  Multiple Vitamin (MULTIVITAMIN WITH MINERALS) TABS tablet Take 1 tablet by mouth in the morning.    Yes [provider]  omega-3 acid ethyl esters (LOVAZA) 1 g capsule Take 2 g by mouth 2 (two) times daily.  04/07/19  Yes [provider]  ondansetron (ZOFRAN) 4 MG tablet Take 4 mg by mouth every 8 (eight) hours as needed for nausea or vomiting.   Yes [provider]  oxyCODONE-acetaminophen (PERCOCET/ROXICET) 5-325 MG tablet Take 1 tablet by mouth every 4 (four) hours as needed for moderate pain. 02/27/20  Yes [provider]  Ped Vitamins ACD Fl-Iron (TRI-VIT/FLUORIDE/IRON PO) Take 1 tablet by mouth daily. 08/01/19  Yes [provider]  pravastatin (PRAVACHOL) 20 MG tablet Take 20 mg by mouth in the morning.    Yes [provider]  Probiotic Product (PROBIOTIC DAILY PO) Take 1 tablet by mouth in the morning.    Yes [provider]  spironolactone (ALDACTONE) 50 MG tablet Take 50 mg by mouth in the morning.    Yes [provider]  sulfamethoxazole-trimethoprim (BACTRIM DS) 800-160 MG tablet Take 1 tablet by mouth once a week. Take every Friday after dialysis. 09/23/21  Yes Erenest Rasher, PA-C  thiamine  (VITAMIN B-1) 100 MG tablet Take 100 mg by mouth daily. 08/27/20  Yes [provider]  tiZANidine (ZANAFLEX) 2 MG tablet Take 2 mg by mouth 2 (two) times daily. 08/20/21  Yes [provider]  TRESIBA FLEXTOUCH 200 UNIT/ML FlexTouch Pen Inject 120 Units into the skin at bedtime. 04/22/21  Yes [provider]  triamcinolone cream (KENALOG) 0.1 % Apply 1 application topically 2 (two) times daily as needed (dry skin Itching). 12/30/17  Yes [provider]  venlafaxine (EFFEXOR) 75 MG tablet Take 75 mg by mouth 3 (three) times daily with meals.    Yes [provider]  XIFAXAN 550 MG TABS tablet TAKE 1 TABLET BY MOUTH TWICE DAILY Patient taking differently: Take 550 mg by mouth 2 (two) times daily. 02/13/21  Yes Mahala Menghini, PA-C  BD PEN NEEDLE MICRO U/F 32G X 6 MM MISC SMARTSIG:SUB-Q Daily PRN 04/23/21   [provider]  Continuous Blood Gluc Sensor (FREESTYLE LIBRE 2 SENSOR) MISC Apply topically. 10/15/21   [provider]  GLUCAGEN HYPOKIT 1 MG SOLR injection Inject 1 mg into the muscle once as needed for low blood sugar. 11/28/20   [provider]  Milk Thistle 250 MG CAPS Take 250 mg by mouth in the morning, at noon, and at bedtime.    [provider]      Allergies    Tape    Review of Systems   Review of Systems  Constitutional:  Negative for fever.  Respiratory:  Negative for shortness of breath.   Cardiovascular:  Negative for chest pain.  Gastrointestinal:  Negative for abdominal pain, nausea and vomiting.  Genitourinary:  Negative for dysuria.  Musculoskeletal:  Positive for gait problem.  Neurological:  Positive for weakness. Negative for numbness.    Physical Exam Updated Vital Signs BP 121/71   Pulse 73   Temp 97.9 F (36.6 C) (Oral)   Resp 13   Ht '5\' 5"'$  (1.651 m)   Wt 84 kg   SpO2 95%   BMI 30.82 kg/m  Physical Exam Vitals and nursing note reviewed.  Constitutional:      General: He is not  in acute distress.    Appearance: He is ill-appearing.     Comments: Chronically ill appearing  HENT:     Head: Atraumatic.  Eyes:     General: Scleral icterus present.     Conjunctiva/sclera: Conjunctivae normal.  Cardiovascular:     Rate and Rhythm: Normal rate and regular rhythm.     Pulses: Normal pulses.     Heart sounds: Murmur heard.  Pulmonary:     Effort: Pulmonary effort is normal. No respiratory distress.     Breath sounds: Normal breath sounds.  Abdominal:     General: Abdomen is flat. There is no distension.     Palpations: Abdomen is soft.     Tenderness: There is no abdominal tenderness. There is no right CVA tenderness or left CVA tenderness.  Musculoskeletal:        General: Normal range of motion.     Cervical back: Normal range of motion.     Comments: No midline lumbar tenderness  Skin:    General: Skin is warm and dry.     Capillary Refill: Capillary refill takes less than 2 seconds.  Neurological:     General: No focal deficit present.     Mental Status: He is alert.     Comments: Strong grip strength bilaterally Intact strength of lower extremities including with leg raise off table, dorsiflexion and plantar flexion Subjective sensation intact Speech is clear, able to follow commands CN III-XII intact Moves extremities without ataxia, coordination intact Normal finger to nose and rapid alternating movements No pronator drift    Psychiatric:        Mood and Affect: Mood normal.     ED Results / Procedures / Treatments   Labs (all labs ordered are listed, but only abnormal results are displayed) Labs Reviewed  COMPREHENSIVE METABOLIC PANEL - Abnormal; Notable for the following components:      Result Value   Sodium 133 (*)    Chloride 96 (*)    Glucose, Bld 158 (*)    BUN 34 (*)    Creatinine, Ser 4.43 (*)    Albumin 2.8 (*)    AST 46 (*)    Alkaline Phosphatase 144 (*)    GFR, Estimated 13 (*)  All other components within normal  limits  CBC WITH DIFFERENTIAL/PLATELET - Abnormal; Notable for the following components:   WBC 3.5 (*)    RBC 2.15 (*)    Hemoglobin 7.1 (*)    HCT 22.9 (*)    MCV 106.5 (*)    RDW 21.1 (*)    Platelets 111 (*)    Lymphs Abs 0.2 (*)    All other components within normal limits  URINALYSIS, ROUTINE W REFLEX MICROSCOPIC - Abnormal; Notable for the following components:   APPearance CLOUDY (*)    Hgb urine dipstick MODERATE (*)    Protein, ur 100 (*)    Leukocytes,Ua LARGE (*)    WBC, UA >50 (*)    Bacteria, UA MANY (*)    All other components within normal limits  AMMONIA - Abnormal; Notable for the following components:   Ammonia 65 (*)    All other components within normal limits  CBG MONITORING, ED - Abnormal; Notable for the following components:   Glucose-Capillary 155 (*)    All other components within normal limits  C DIFFICILE QUICK SCREEN W PCR REFLEX    URINE CULTURE  HEPATITIS B SURFACE ANTIGEN    EKG None  Radiology DG Chest 2 View  Result Date: 02/02/2022 CLINICAL DATA:  Dialysis, missed dialysis, weakness, multiple falls EXAM: CHEST - 2 VIEW COMPARISON:  08/10/2021 FINDINGS: Gross cardiomegaly. Layering bilateral pleural effusions, similar to prior examination. Pulmonary vascular prominence. Osseous structures unremarkable. IMPRESSION: Gross cardiomegaly with pulmonary vascular prominence and layering bilateral pleural effusions, similar to prior examination. Electronically Signed   By: Delanna Ahmadi M.D.   On: 02/02/2022 15:44    Procedures Procedures    Medications Ordered in ED Medications  piperacillin-tazobactam (ZOSYN) IVPB 3.375 g (0 g Intravenous Stopped 02/02/22 1607)    ED Course/ Medical Decision Making/ A&P                           Medical Decision Making Amount and/or Complexity of Data Reviewed Labs: ordered. Radiology: ordered.  Risk Prescription drug management. Decision regarding hospitalization.   Social determinants of health:   Social History   Socioeconomic History   Marital status: Married    Spouse name: Not on file   Number of children: 2   Years of education: Not on file   Highest education level: Not on file  Occupational History   Occupation: RETIRED    Employer: SELF EMPLOYED  Tobacco Use   Smoking status: Former    Packs/day: 2.00    Years: 25.00    Total pack years: 50.00    Types: Cigarettes    Quit date: 01/28/1986    Years since quitting: 36.0   Smokeless tobacco: Never  Vaping Use   Vaping Use: Never used  Substance and Sexual Activity   Alcohol use: No    Comment: quit in 05/2013   Drug use: No   Sexual activity: Not Currently  Other Topics Concern   Not on file  Social History Narrative   Two step children from second marriage (divorced) who live with him along with some grandchildren.    Social Determinants of Health   Financial Resource Strain: Not on file  Food Insecurity: No Food Insecurity (09/30/2021)   Hunger Vital Sign    Worried About Running Out of Food in the Last Year: Never true    Ran Out of Food in the Last Year: Never true  Transportation Needs: No Transportation  Needs (09/30/2021)   PRAPARE - Hydrologist (Medical): No    Lack of Transportation (Non-Medical): No  Physical Activity: Inactive (05/30/2020)   Exercise Vital Sign    Days of Exercise per Week: 0 days    Minutes of Exercise per Session: 0 min  Stress: Not on file  Social Connections: Not on file  Intimate Partner Violence: Not At Risk (05/30/2020)   Humiliation, Afraid, Rape, and Kick questionnaire    Fear of Current or Ex-Partner: No    Emotionally Abused: No    Physically Abused: No    Sexually Abused: No     Initial impression:  This patient presents to the ED for concern of weakness, this involves an extensive number of treatment options, and is a complaint that carries with it a high risk of complications and morbidity.   Differentials include cauda equina,  sepsis, arrhythmia, encephalopathy, anemia    Comorbidities affecting care:  Numerous refer to HPI  Additional history obtained: Wife  Lab Tests  I Ordered, reviewed, and interpreted labs and EKG.  The pertinent results include:  Potassium normal Urinary tract infection on UA Ammonia 65  Imaging Studies ordered:  I ordered imaging studies including  Chest x-ray with cardiomegaly and bilateral pleural effusions, stable since previous I independently visualized and interpreted imaging and I agree with the radiologist interpretation.   EKG: Sinus rhythm    Medicines ordered and prescription drug management:  I ordered medication including: Zosyn for UTI Reevaluation of the patient after these medicines showed that the patient stayed the same I have reviewed the patients home medicines and have made adjustments as needed   Consultations Obtained:  I requested consultation with hospitalist,  and discussed lab and imaging findings as well as pertinent plan - Dr Denton Brick agrees to admit patient.   ED Course/Re-evaluation: Patient is chronically ill-appearing although in no acute distress.  Vitals are without significant abnormality.  On exam, he is noted to be icteric.  No abdominal tenderness.  Neuro exam normal.  He has bruising up and down his bilateral upper extremities due to his frequent falls.  I ordered labs which show evidence of urinary tract infection and elevated ammonia levels.  Potassium is normal.  Chest x-ray stable since previous.  Given that he has a catheter, patient was given Zosyn IV here in the emergency department for his urinary tract infection.  Patient admitted for suspected hepatic encephalopathy and UTI. Disposition:  After consideration of the diagnostic results, physical exam, history and the patients response to treatment feel that the patent would benefit from admission.   Hepatic encephalopathy UTI : Plan and management as described above.  Discharged home in good condition.  Final Clinical Impression(s) / ED Diagnoses Final diagnoses:  Encephalopathy, hepatic ([place])  Urinary tract infection associated with indwelling urethral catheter, initial encounter [facility])    Rx / [place] Orders ED Discharge Orders     None         Tonye Pearson, PA-C 02/02/22 1754    Fredia Sorrow, MD 02/15/22 0002

## 2022-02-02 NOTE — Assessment & Plan Note (Deleted)
Generalized weakness,

## 2022-02-02 NOTE — Assessment & Plan Note (Addendum)
Schedule Monday Wednesday Friday.  Missed HD today.  Chest X-ray with stable layering bilateral effusions.  -nephrology consultation appreciated -HD on 02/07/22 prior to d/c

## 2022-02-02 NOTE — Assessment & Plan Note (Addendum)
2-3 episodes of watery stools over the past 2 weeks.  So has not taken Constulose in 2 weeks also.  History of C. difficile infection.  Afebrile.  Stable mild leukopenia, no abdominal pain.  Was prescribed colchicine about 6 weeks ago for gout, which could have precipitated the diarrhea -Stop colchicine for now -no abd pain -low suspicion for invasive C diff infection

## 2022-02-03 ENCOUNTER — Inpatient Hospital Stay: Payer: Medicare Other

## 2022-02-03 DIAGNOSIS — N186 End stage renal disease: Secondary | ICD-10-CM | POA: Diagnosis not present

## 2022-02-03 DIAGNOSIS — I10 Essential (primary) hypertension: Secondary | ICD-10-CM

## 2022-02-03 DIAGNOSIS — I5032 Chronic diastolic (congestive) heart failure: Secondary | ICD-10-CM | POA: Diagnosis not present

## 2022-02-03 DIAGNOSIS — K7682 Hepatic encephalopathy: Secondary | ICD-10-CM | POA: Diagnosis not present

## 2022-02-03 DIAGNOSIS — K7030 Alcoholic cirrhosis of liver without ascites: Secondary | ICD-10-CM | POA: Diagnosis not present

## 2022-02-03 LAB — BASIC METABOLIC PANEL
Anion gap: 9 (ref 5–15)
BUN: 39 mg/dL — ABNORMAL HIGH (ref 8–23)
CO2: 25 mmol/L (ref 22–32)
Calcium: 9 mg/dL (ref 8.9–10.3)
Chloride: 99 mmol/L (ref 98–111)
Creatinine, Ser: 4.96 mg/dL — ABNORMAL HIGH (ref 0.61–1.24)
GFR, Estimated: 11 mL/min — ABNORMAL LOW (ref >60)
Glucose, Bld: 110 mg/dL — ABNORMAL HIGH (ref 70–99)
Potassium: 4.2 mmol/L (ref 3.5–5.1)
Sodium: 133 mmol/L — ABNORMAL LOW (ref 135–145)

## 2022-02-03 LAB — AMMONIA: Ammonia: 64 umol/L — ABNORMAL HIGH (ref 9–35)

## 2022-02-03 LAB — CBC
HCT: 20.7 % — ABNORMAL LOW (ref 39.0–52.0)
Hemoglobin: 6.4 g/dL — CL (ref 13.0–17.0)
MCH: 33.2 pg (ref 26.0–34.0)
MCHC: 30.9 g/dL (ref 30.0–36.0)
MCV: 107.3 fL — ABNORMAL HIGH (ref 80.0–100.0)
Platelets: 89 10*3/uL — ABNORMAL LOW (ref 150–400)
RBC: 1.93 MIL/uL — ABNORMAL LOW (ref 4.22–5.81)
RDW: 21.1 % — ABNORMAL HIGH (ref 11.5–15.5)
WBC: 2.9 10*3/uL — ABNORMAL LOW (ref 4.0–10.5)
nRBC: 0 % (ref 0.0–0.2)

## 2022-02-03 LAB — PREPARE RBC (CROSSMATCH)

## 2022-02-03 LAB — HEMOGLOBIN AND HEMATOCRIT, BLOOD
HCT: 27 % — ABNORMAL LOW (ref 39.0–52.0)
Hemoglobin: 8.7 g/dL — ABNORMAL LOW (ref 13.0–17.0)

## 2022-02-03 LAB — GLUCOSE, CAPILLARY
Glucose-Capillary: 103 mg/dL — ABNORMAL HIGH (ref 70–99)
Glucose-Capillary: 120 mg/dL — ABNORMAL HIGH (ref 70–99)
Glucose-Capillary: 110 mg/dL — ABNORMAL HIGH (ref 70–99)
Glucose-Capillary: 97 mg/dL (ref 70–99)

## 2022-02-03 LAB — HEPATITIS B SURFACE ANTIGEN: Hepatitis B Surface Ag: NONREACTIVE

## 2022-02-03 MED ORDER — LIDOCAINE HCL (PF) 1 % IJ SOLN: 5.0000 mL | INTRAMUSCULAR | Status: DC | PRN

## 2022-02-03 MED ORDER — DILTIAZEM HCL ER COATED BEADS 120 MG PO CP24
120.0000 mg | ORAL_CAPSULE | Freq: Every day | ORAL | Status: DC
  Administered 2022-02-03 – 2022-02-07 (×4): 120 mg via ORAL
  Filled 2022-02-03 (×4): qty 1

## 2022-02-03 MED ORDER — METOPROLOL SUCCINATE ER 25 MG PO TB24
25.0000 mg | ORAL_TABLET | ORAL | Status: DC
  Administered 2022-02-04: 25 mg via ORAL
  Filled 2022-02-03 (×2): qty 1

## 2022-02-03 MED ORDER — CHLORHEXIDINE GLUCONATE CLOTH 2 % EX PADS
6.0000 | MEDICATED_PAD | Freq: Every day | CUTANEOUS | Status: DC
  Administered 2022-02-03 – 2022-02-07 (×5): 6 via TOPICAL

## 2022-02-03 MED ORDER — LIDOCAINE-PRILOCAINE 2.5-2.5 % EX CREA: 1.0000 | TOPICAL_CREAM | CUTANEOUS | Status: DC | PRN

## 2022-02-03 MED ORDER — SODIUM CHLORIDE 0.9% IV SOLUTION: Freq: Once | INTRAVENOUS | Status: DC

## 2022-02-03 MED ORDER — LACTULOSE 10 GM/15ML PO SOLN
10.0000 g | Freq: Two times a day (BID) | ORAL | Status: DC
  Administered 2022-02-03 – 2022-02-07 (×6): 10 g via ORAL
  Filled 2022-02-03 (×6): qty 30

## 2022-02-03 MED ORDER — LACTULOSE 10 GM/15ML PO SOLN
10.0000 g | Freq: Three times a day (TID) | ORAL | Status: DC
  Administered 2022-02-03: 10 g via ORAL
  Filled 2022-02-03: qty 30

## 2022-02-03 MED ORDER — PENTAFLUOROPROP-TETRAFLUOROETH EX AERO: 1.0000 | INHALATION_SPRAY | CUTANEOUS | Status: DC | PRN

## 2022-02-03 MED ORDER — FAMOTIDINE 20 MG PO TABS
20.0000 mg | ORAL_TABLET | Freq: Every day | ORAL | Status: DC
  Administered 2022-02-03 – 2022-02-07 (×4): 20 mg via ORAL
  Filled 2022-02-03 (×4): qty 1

## 2022-02-03 NOTE — Hospital Course (Signed)
76 y.o. 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  Patient was brought to the ED with reports of weakness over the past few weeks and multiple falls recently.  At the time of my evaluation, patient is alone,  awake, but appears lethargic, slightly drowsy, answers a few questions appropriately, but also appears slightly confused.    I talked to patient's spouse on the phone, she confirms drowsiness, and intermittent confusion over the past few days.  She reports over the past 2 weeks, patient has been having at least 2-3 watery stools every day, so she did not give him his lactulose the past 2 weeks, but he was getting his rifaximin.  At baseline patient has 1-2 bowel movements a day and usually not watery, so and she was concerned for C. difficile infection.  Patient is also taking colchicine for gout, he has been on it for about 6 weeks now.     Patient's HD schedule is Monday Wednesday Friday, he did not get dialyzed today, instead he came to the ED.  Patient denies any pain to me.  Reports a chronic mild cough that is unchanged.  He does make some urine, he denies any urinary symptoms at this time.     Stopped drinking alcohol 2014 per spouse.   ED Course: Tmax 98.8.  Heart rate 69-75.  Respiratory rate 12-20.  Blood pressure systolic 709-628.  O2 sats greater than 93% on 3 L. Ammonia level 65. Hemoglobin 7.1. UA with large leukocytes many bacteria greater than 50 WBCs. 2 view chest x-ray-gross cardiomegaly with pulmonary vascular prominence and layering bilateral pleural effusions similar to prior exam. Zosyn started for UTI.  Hospitalist to admit.

## 2022-02-03 NOTE — Progress Notes (Signed)
PROGRESS NOTE   Jeffrey [name]  PYP:[id number] DOB: 1945/10/23 DOA: 02/02/2022 PCP: Monico Blitz, MD   Chief Complaint  Patient presents with   Weakness   Level of care: Telemetry  Brief Admission History:  75 y.o. 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  Patient was brought to the ED with reports of weakness over the past few weeks and multiple falls recently.  At the time of my evaluation, patient is alone,  awake, but appears lethargic, slightly drowsy, answers a few questions appropriately, but also appears slightly confused.    I talked to patient's spouse on the phone, she confirms drowsiness, and intermittent confusion over the past few days.  She reports over the past 2 weeks, patient has been having at least 2-3 watery stools every day, so she did not give him his lactulose the past 2 weeks, but he was getting his rifaximin.  At baseline patient has 1-2 bowel movements a day and usually not watery, so and she was concerned for C. difficile infection.  Patient is also taking colchicine for gout, he has been on it for about 6 weeks now.     Patient's HD schedule is Monday Wednesday Friday, he did not get dialyzed today, instead he came to the ED.  Patient denies any pain to me.  Reports a chronic mild cough that is unchanged.  He does make some urine, he denies any urinary symptoms at this time.     Stopped drinking alcohol 2014 per spouse.   ED Course: Tmax 98.8.  Heart rate 69-75.  Respiratory rate 12-20.  Blood pressure systolic 245-809.  O2 sats greater than 93% on 3 L. Ammonia level 65. Hemoglobin 7.1. UA with large leukocytes many bacteria greater than 50 WBCs. 2 view chest x-ray-gross cardiomegaly with pulmonary vascular prominence and layering bilateral pleural effusions  similar to prior exam. Zosyn started for UTI.  Hospitalist to admit.   Assessment and Plan: * Acute hepatic encephalopathy ([place]) He presented lethargic with generalized weakness.  Also reports of multiple falls.  Ammonia level elevated at 65, higher than baseline, last check 5 months ago ammonia was 15.  Has not taking Constulose in 2 weeks due to diarrhea, but has been taking rifaximin.  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  He denies urinary symptoms.  Chest x-ray with chronic findings.  Abdominal without tenderness.  Rules out for sepsis.  Encephalopathy likely hepatic in etiology. -Zosyn given in ED, will hold off on further antibiotics at this time -Lactulose ordered and rifaximin. -head CT: no acute findings -Hold gabapentin, as needed Xanax, hydroxyzine, Percocet, mirtazapine, tizanidine, Effexor  Iron deficiency anemia due to chronic blood loss Hemoglobin 6.4, baseline 7-8. -transfuse 1 unit PRBC with hemodialysis (already ordered by night coverage) - hold subcutaneous heparin due to drop in Hg  Chronic diastolic HF (heart failure) (HCC) Stable and compensated.  On Lasix 80 mg daily, also on HD.  Last echo 2021 shows EF of 55 to 60% with mild LVH. - Fluid management Per HD  Falls Multiple falls and generalized weakness.  Falls weakness, spouse reports patient did not hit his head. -CT head no acute findings  ESRD on dialysis [facility]) Schedule Monday Wednesday Friday.  Missed HD today.  Chest X-ray with stable layering bilateral effusions.  -nephrology consultation requested for HD  Generalized weakness --  suspect acutely exacerbated by anemia --Hg down to 6.4 --transfuse PRBC with hemodialysis   Alcoholic cirrhosis ([place]) -- will need to follow up with GI outpatient for ongoing management   Essential hypertension, benign Stable. -Resume diltiazem, metoprolol, - Hold Lasix 80 mg daily, spironolactone  Diarrhea in adult patient 2-3  episodes of watery stools over the past 2 weeks.  So has not taken Constulose in 2 weeks also.  History of C. difficile infection.  Afebrile.  Stable mild leukopenia, no abdominal pain.  Was prescribed colchicine about 6 weeks ago for gout, which could have precipitated the diarrhea -Stop colchicine for now -If further concerns for C. difficile diarrhea, will need testing  Pancytopenia ([place]) -Follow CBC   DVT prophylaxis: SCDs Code Status: full  Family Communication:  Disposition: Status is: Observation The patient remains OBS appropriate and will d/c before 2 midnights.   Consultants:   Procedures:   Antimicrobials:    Subjective: Reports that he is feeling "weak"   Objective: Vitals:   02/02/22 2148 02/03/22 0241 02/03/22 0454 02/03/22 0902  BP: (!) 144/66 (!) 130/59 130/66 129/62  Pulse: 73 70 70   Resp: '17 16 15   '$ Temp: (!) 97.2 F (36.2 C) (!) 97.2 F (36.2 C) (!) 96.9 F (36.1 C)   TempSrc:      SpO2: 100% 100% 100%   Weight:      Height:        Intake/Output Summary (Last 24 hours) at 02/03/2022 1105 Last data filed at 02/03/2022 0900 Gross per 24 hour  Intake 440 ml  Output 150 ml  Net 290 ml   Filed Weights   02/02/22 1148  Weight: 84 kg   Examination:  General exam: chronically ill appearing, frail, pale, Appears calm and comfortable  Respiratory system: bibasilar crackles. Respiratory effort normal. Cardiovascular system: normal S1 & S2 heard. No JVD, murmurs, rubs, gallops or clicks. No pedal edema. Gastrointestinal system: Abdomen is nondistended, soft and nontender. No organomegaly or masses felt. Normal bowel sounds heard. Central nervous system: Alert and oriented. No focal neurological deficits. Extremities: Symmetric 5 x 5 power. Skin: No rashes, lesions or ulcers. Psychiatry: Judgement and insight appear normal. Mood & affect appropriate.   Data Reviewed: I have personally reviewed following labs and imaging studies  CBC: Recent Labs   Lab 02/02/22 1327 02/03/22 0333  WBC 3.5* 2.9*  NEUTROABS 3.0  --   HGB 7.1* 6.4*  HCT 22.9* 20.7*  MCV 106.5* 107.3*  PLT 111* 89*    Basic Metabolic Panel: Recent Labs  Lab 02/02/22 1327 02/03/22 0333  NA 133* 133*  K 4.2 4.2  CL 96* 99  CO2 25 25  GLUCOSE 158* 110*  BUN 34* 39*  CREATININE 4.43* 4.96*  CALCIUM 9.2 9.0  MG 1.7  --     CBG: Recent Labs  Lab 02/02/22 1154 02/02/22 2146 02/03/22 0743  GLUCAP 155* 139* 103*    No results found for this or any previous visit (from the past 240 hour(s)).   Radiology Studies: CT HEAD WO CONTRAST (5MM)  Result Date: 02/03/2022 CLINICAL DATA:  Initial evaluation for encephalopathy, multiple falls. EXAM: CT HEAD WITHOUT CONTRAST TECHNIQUE: Contiguous axial images were obtained from the base of the skull through the vertex without intravenous contrast. RADIATION DOSE REDUCTION: This exam was performed according to the departmental dose-optimization program which includes automated exposure control, adjustment of the mA and/or kV according to patient size and/or use of iterative reconstruction technique. COMPARISON:  Prior CT from  04/01/2020. FINDINGS: Brain: Age-related cerebral atrophy with chronic small vessel ischemic disease. Small remote left cerebellar infarct noted, stable. No acute intracranial hemorrhage. No acute large vessel territory infarct. No mass lesion, midline shift or mass effect. No hydrocephalus or extra-axial fluid collection. Vascular: No abnormal hyperdense vessel. Scattered vascular calcifications noted within the carotid siphons. Skull: Scalp soft tissues demonstrate no acute finding. Calvarium intact. Sinuses/Orbits: Globes orbital soft tissues within normal limits. Paranasal sinuses and mastoid air cells are clear. Other: None. IMPRESSION: 1. No acute intracranial abnormality. 2. Age-related cerebral atrophy with chronic small vessel ischemic disease, with a small remote left cerebellar infarct.  Electronically Signed   By: Jeannine Boga M.D.   On: 02/03/2022 01:49   DG Chest 2 View  Result Date: 02/02/2022 CLINICAL DATA:  Dialysis, missed dialysis, weakness, multiple falls EXAM: CHEST - 2 VIEW COMPARISON:  08/10/2021 FINDINGS: Gross cardiomegaly. Layering bilateral pleural effusions, similar to prior examination. Pulmonary vascular prominence. Osseous structures unremarkable. IMPRESSION: Gross cardiomegaly with pulmonary vascular prominence and layering bilateral pleural effusions, similar to prior examination. Electronically Signed   By: Delanna Ahmadi M.D.   On: 02/02/2022 15:44    Scheduled Meds:  sodium chloride   Intravenous Once   allopurinol  300 mg Oral Daily   Chlorhexidine Gluconate Cloth  6 each Topical Q0600   diltiazem  120 mg Oral Daily   famotidine  20 mg Oral Daily   insulin aspart  0-5 Units Subcutaneous QHS   insulin aspart  0-9 Units Subcutaneous TID WC   lactulose  10 g Oral BID   loratadine  10 mg Oral Daily   metoprolol succinate  25 mg Oral QHS   metoprolol succinate  25 mg Oral Once per day on Sun Tue Thu Sat   rifaximin  550 mg Oral BID   Continuous Infusions:   LOS: 0 days   Time spent: 36 mins  Tien Spooner Wynetta Emery, MD How to contact the [facility] Attending or Consulting provider [place] or covering provider during after hours [place], for this patient?  Check the care team in [facility] and look for a) attending/consulting [facility] provider listed and b) the [facility] team listed Log into [email or web address] and use [place]'s universal password to access. If you do not have the password, please contact the hospital operator. Locate the [facility] provider you are looking for under [facility] and page to a number that you can be directly reached. If you still have difficulty reaching the provider, please page the [facility] (Director on Call) for the Hospitalists listed on amion for assistance.  02/03/2022, 11:05 AM

## 2022-02-03 NOTE — Progress Notes (Signed)
Alerted by nurse tech that pt has bleeding area noted to right forearm. Pt noted to have 2.5 cm skin tear with fresh bleeding to right anterior forearm. Area cleaned and foam dressing applied.

## 2022-02-03 NOTE — Progress Notes (Signed)
  Transition of Care [facility]) Screening Note   Patient Details  Name: Jeffrey [name] Date of Birth: [birth year]   Transition of Care [facility]) CM/SW Contact:    Ihor Gully, LCSW Phone Number: 02/03/2022, 1:54 PM    Transition of Care Department [facility]) has reviewed patient and no TOC needs have been identified at this time. We will continue to monitor patient advancement through interdisciplinary progression rounds. If new patient transition needs arise, please place a TOC consult.

## 2022-02-03 NOTE — Consult Note (Signed)
[facility] Renal Consultation Note    Indication for Consultation:  Management of ESRD/hemodialysis; anemia, hypertension/volume and secondary hyperparathyroidism  HPI: Jeffrey [name] is a 76 y.o. male with a PMH significant for alcoholic cirrhosis, DM, HTN, atrial fib, B12 deficiency, pancytopenia, transfusion dependent anemia, esophageal varices, GERD, PUD, and ESRD on HD MWF at [facility] who presented to [facility] ED with weakness and falls at home.  He has also been having diarrhea as well as some confusion in the ED yesterday.  He was not taking lactulose at home.  In the ED, VSS, labs notable for Na 133, Cl 96, alb 2.8, WBC 3.5, Hgb 7.1, plt 111.  He was admitted for ABLA and hepatic encephalopathy.  We were consulted to provide HD during his hospitalization.  He is more awake and alert this morning and reports having "black stools".  No SOB, chest pain, N/V, hematemesis.  Past Medical History:  Diagnosis Date   A-fib [facility])    when initially stating dialysis january 2021 at [facility] per spouse; never heard anything else about it   Alcoholic cirrhosis [facility])    patient reports completing hep a and b vaccines in 2006   Anemia    has had 3 units of prbcs 2013   Anxiety    Arthritis    Asymptomatic gallstones    Ultrasound in 2006   B12 deficiency 11/21/2020   C. difficile colitis    Cholelithiasis    Chronic kidney disease    Dialysis M/W/F/Sa   Depression    Diabetes ([place])    Duodenal ulcer with hemorrhage    per patient in 2006 or 2007, records have been requested   Dyspnea    low oxygen level -  has O2 2 L all day   Esophageal varices ([place])    see PSH   GERD (gastroesophageal reflux disease)    Heart burn    History of alcohol abuse    quit 05/2013   HOH (hard of hearing)    Hypertension    Liver cirrhosis ([place])    ETOH   SBP (spontaneous bacterial peritonitis) ([place])    2020, November 2021.    Splenomegaly    Ultrasound in 2006   Past Surgical  History:  Procedure Laterality Date   AGILE CAPSULE N/A 09/20/2019   Procedure: AGILE CAPSULE;  Surgeon: Daneil Dolin, MD;  [facility];  Service: Endoscopy;  Laterality: N/A;   APPENDECTOMY     AV FISTULA PLACEMENT Left 08/17/2019   Procedure: ARTERIOVENOUS (AV) FISTULA CREATION VERSES ARTERIOVENOUS GRAFT;  Surgeon: [name], MD;  Location: [facility] OR;  Service: Vascular;  Laterality: Left;   BASCILIC VEIN TRANSPOSITION Left 09/28/2019   Procedure: LEFT SECOND STAGE [place];  Surgeon: [name], MD;  Location: [place];  Service: Vascular;  Laterality: Left;   BIOPSY  02/03/2018   Procedure: BIOPSY;  Surgeon: Daneil Dolin, MD;  [facility];  Service: Endoscopy;;  bx of gastric polyps   BIOPSY  09/19/2019   Procedure: BIOPSY;  Surgeon: Danie Binder, MD;  [facility];  Service: Endoscopy;;   BIOPSY  01/16/2021   Procedure: BIOPSY;  Surgeon: Daneil Dolin, MD;  [facility];  Service: Endoscopy;;   CATARACT EXTRACTION Right    COLONOSCOPY  03/10/2005   Rectal polyp as described above, removed with snare. Left sided  diverticula. The remainder of the colonic mucosa appeared normal. Inflammed polyp on path.  COLONOSCOPY  04/1999   Dr. Thea Silversmith polyps removed,  Path showed hyperplastic   COLONOSCOPY  10/2015   Dr. Britta Mccreedy: diverticulosis, single sessile tubular adenoma 3-66m in size removed from descending colon.    COLONOSCOPY WITH ESOPHAGOGASTRODUODENOSCOPY (EGD)  03/31/2012   [id number]. Colonic diverticulosis. tubular adenoma colon   COLONOSCOPY WITH ESOPHAGOGASTRODUODENOSCOPY (EGD)  06/2019   FORSYTH: small esophageal varices without high risk stigmata, single large AVM on lesser curvature in gastric body s/p APC ablation, gastric antral and duodenal bulb polyposis. No significant source to explain transfusion dependent anemia. Colonoscopy with portal colopathy and diffuse edema, changes of Cdiff  colitis on colonoscopy   COLONOSCOPY WITH PROPOFOL N/A 01/19/2021   Surgeon: CDaryel November MD; 15 mm polyp in descending colon removed piecemeal, 6 mm polyp in ascending colon and 12 mm polyp in sigmoid colon not removed due to high bleeding risk, benign polypoid lesion at splenic flexure, diverticulosis in sigmoid and descending colon, rectal varices.   COLONOSCOPY WITH PROPOFOL N/A 01/16/2021   Surgeon: RDaneil Dolin MD;  Portal colopathy, large rectal varices, polypoid mass at splenic flexure biopsy, two 1 cm left colon polyps not removed, left-sided diverticulosis.  Reported patient had bleeding from splenic flexure lesion.  Pathology with acutely inflamed colonic mucosa with localized ulceration and prominent reactive changes.   ESOPHAGEAL BANDING N/A 02/03/2018   Procedure: ESOPHAGEAL BANDING;  Surgeon: RDaneil Dolin MD;  [facility];  Service: Endoscopy;  Laterality: N/A;   ESOPHAGEAL BANDING  01/16/2021   Procedure: ESOPHAGEAL BANDING;  Surgeon: RDaneil Dolin MD;  [facility];  Service: Endoscopy;;   ESOPHAGOGASTRODUODENOSCOPY  01/15/2005   Three columns grade 1 to 2 esophageal varices, otherwise normal esophageal mucosa.  Esophagus was not manipulated otherwise./Nodularity of the antrum with overlying erosions, nonspecific finding. Path showed rare H.pylori   ESOPHAGOGASTRODUODENOSCOPY  09/2008   Dr. RGaylene Brookscolumns of grade 2-3 esoph varices, only one column was prominent. Portal gastropathy, multiple gastrc polyps at antrum, two were 2cm with black eschar, bulbar polyps, bulbar erosions   ESOPHAGOGASTRODUODENOSCOPY  11/2004   Dr. FBrantley Stage3 esoph varices   ESOPHAGOGASTRODUODENOSCOPY  03/31/2012   RMR: 4 columns(3-GR 2, 1-GR1) non-bleeding esophageal varices, portal gastropathy, small HH, early GAVE, multiple gastric polyps    ESOPHAGOGASTRODUODENOSCOPY (EGD) WITH PROPOFOL N/A 02/03/2018   Dr. RGala Romney Esophageal varices, 3 columns  grade 1-2.  Portal hypertensive gastropathy.  Multiple gastric polyps, biopsy consistent with hyperplastic.   ESOPHAGOGASTRODUODENOSCOPY (EGD) WITH PROPOFOL N/A 09/19/2019   Fields: grade I and II esophageal varcies, mild portal hypertensive gastropathy, moderate gastritis but no H. pylori, single hyperplastic gastric polyp removed, obvious source for melena/transfusion dependent anemia not identified, may be due to friable gastric and duodenal mucosa in the setting of portal hypertension   ESOPHAGOGASTRODUODENOSCOPY (EGD) WITH PROPOFOL N/A 01/16/2021   Surgeon: RDaneil Dolin MD; Portal hypertensive gastropathy, grade 2 esophageal varices with bleeding stigmata s/p banding x4, slight nodularity and congestion involving bulb and second portion of duodenum, nonspecific.  Recommended repeat EGD in 4 weeks.   ESOPHAGOGASTRODUODENOSCOPY (EGD) WITH PROPOFOL N/A 05/22/2021   stigmata of prior esophageal banding and no significant varices seen. Portal gastropathy. Polypoid gastric and duodenal mucosa with overlying erosions, marked mucosal friability.   GASTRIC VARICES BANDING  03/31/2012   Procedure: GASTRIC VARICES BANDING;  Surgeon: RDaneil Dolin MD;  [facility];  Service: Endoscopy;  Laterality: N/A;   GIVENS CAPSULE STUDY N/A 09/21/2019   Poor study with a lot of  debris obstructing much of the view of the first approximate 3 hours out of 6 hours.  No obvious source of bleeding identified.  Sequela of bleeding and old blood seen in the form of"whisps" of blood, blood flecks mostly toward the end of the study.   HEMOSTASIS CLIP PLACEMENT  01/19/2021   Procedure: HEMOSTASIS CLIP PLACEMENT;  Surgeon: [name], MD;  Location: [facility] ENDOSCOPY;  Service: Gastroenterology;;   IR REMOVAL TUN CV CATH W/O FL  02/15/2020   POLYPECTOMY  09/19/2019   Procedure: POLYPECTOMY;  Surgeon: Danie Binder, MD;  [facility];  Service: Endoscopy;;   POLYPECTOMY  01/19/2021   Procedure:  POLYPECTOMY;  Surgeon: [name], MD;  Location: [facility] ENDOSCOPY;  Service: Gastroenterology;;   UMBILICAL HERNIA REPAIR  2017   exploratory laparotomy, abdominal washout   Family History:   Family History  Problem Relation Age of Onset   Colon cancer Father 35       deceased age 47   Breast cancer Sister        deceased   Diabetes Sister    Stroke Mother    Healthy Son    Healthy Daughter    Lung cancer Neg Hx    Ovarian cancer Neg Hx    Social History:  reports that he quit smoking about 36 years ago. His smoking use included cigarettes. He has a 50.00 pack-year smoking history. He has never used smokeless tobacco. He reports that he does not drink alcohol and does not use drugs. Allergies  Allergen Reactions   Tape Itching, Rash and Other (See Comments)    Tears skin   Prior to Admission medications   Medication Sig Start Date End Date Taking? Authorizing Provider  acetaminophen (TYLENOL) 650 MG CR tablet Take 650 mg by mouth every 8 (eight) hours as needed for pain.   Yes [provider]  Alcohol Swabs (ALCOHOL PREP) 70 % PADS Apply 1 each topically 3 (three) times daily. 01/31/20  Yes [provider]  allopurinol (ZYLOPRIM) 300 MG tablet Take 300 mg by mouth daily. 12/11/21  Yes [provider]  ALPRAZolam Duanne Moron) 0.5 MG tablet Take 0.5 mg by mouth See admin instructions. Take one tablet (0.5 mg) by mouth on Monday, Wednesday, Friday before dialysis, may also take one tablet (0.5 mg) daily as needed for anxiety. 09/21/20  Yes [provider]  Ascorbic Acid (VITAMIN C) 1000 MG tablet Take 1,000 mg by mouth in the morning.   Yes [provider]  calcium carbonate (TUMS - DOSED IN MG ELEMENTAL CALCIUM) 500 MG chewable tablet Chew 1 tablet by mouth 3 (three) times daily with meals.   Yes [provider]  Calcium Carbonate-Simethicone (ALKA-SELTZER HEARTBURN + GAS) 750-80 MG CHEW Chew 1-2 tablets by mouth 2 (two) times daily as  needed (acid reflux).   Yes [provider]  cetirizine (ZYRTEC) 10 MG tablet Take 10 mg by mouth daily. 06/24/21  Yes [provider]  cholecalciferol (VITAMIN D3) 25 MCG (1000 UNIT) tablet Take 1,000 Units by mouth daily.   Yes [provider]  colchicine 0.6 MG tablet Take 0.6 mg by mouth 2 (two) times daily. 12/11/21  Yes [provider]  CONSTULOSE 10 GM/15ML solution Take by mouth. 11/21/21  Yes [provider]  diltiazem (DILACOR XR) 120 MG 24 hr capsule Take 120 mg by mouth daily. 11/19/21  Yes [provider]  famotidine (PEPCID) 20 MG tablet Take 1 tablet (20 mg total) by mouth  2 (two) times daily. 07/30/21 07/30/22 Yes Barton Dubois, MD  furosemide (LASIX) 40 MG tablet Take 80 mg by mouth daily. 12/29/19  Yes [provider]  gabapentin (NEURONTIN) 100 MG capsule TAKE 1 CAPSULE BY MOUTH AT BEDTIME Patient taking differently: Take 100 mg by mouth 3 (three) times daily. 05/28/20  Yes Waynetta Sandy, MD  Garlic 9323 MG CAPS Take 1,000 mg by mouth daily.   Yes [provider]  HUMALOG KWIKPEN 100 UNIT/ML KwikPen 10-50 Units 3 (three) times daily. Sliding scale 04/23/21  Yes [provider]  hydrOXYzine (ATARAX/VISTARIL) 25 MG tablet Take 25 mg by mouth 3 (three) times daily. For itching 11/23/20  Yes [provider]  iron polysaccharides (NIFEREX) 150 MG capsule Take 150 mg by mouth daily with supper.   Yes [provider]  metoprolol succinate (TOPROL-XL) 25 MG 24 hr tablet Take 1 tablet (25 mg total) by mouth See admin instructions. Take one tablet (25 mg) by mouth at bedtime every night and take one tablet (25 mg) in morning on  Sun, Tues, Thurs and Sat (non-dialysis days) 04/15/21  Yes Johnson, Clanford L, MD  mirtazapine (REMERON) 30 MG tablet Take 1 tablet (30 mg total) by mouth at bedtime. Patient taking differently: Take 30 mg by mouth daily with supper. 02/10/20  Yes Roxan Hockey,  MD  Multiple Vitamin (MULTIVITAMIN WITH MINERALS) TABS tablet Take 1 tablet by mouth in the morning.    Yes [provider]  omega-3 acid ethyl esters (LOVAZA) 1 g capsule Take 2 g by mouth 2 (two) times daily.  04/07/19  Yes [provider]  ondansetron (ZOFRAN) 4 MG tablet Take 4 mg by mouth every 8 (eight) hours as needed for nausea or vomiting.   Yes [provider]  oxyCODONE-acetaminophen (PERCOCET/ROXICET) 5-325 MG tablet Take 1 tablet by mouth every 4 (four) hours as needed for moderate pain. 02/27/20  Yes [provider]  Ped Vitamins ACD Fl-Iron (TRI-VIT/FLUORIDE/IRON PO) Take 1 tablet by mouth daily. 08/01/19  Yes [provider]  pravastatin (PRAVACHOL) 20 MG tablet Take 20 mg by mouth in the morning.    Yes [provider]  Probiotic Product (PROBIOTIC DAILY PO) Take 1 tablet by mouth in the morning.    Yes [provider]  spironolactone (ALDACTONE) 50 MG tablet Take 50 mg by mouth in the morning.    Yes [provider]  sulfamethoxazole-trimethoprim (BACTRIM DS) 800-160 MG tablet Take 1 tablet by mouth once a week. Take every Friday after dialysis. 09/23/21  Yes Erenest Rasher, PA-C  thiamine (VITAMIN B-1) 100 MG tablet Take 100 mg by mouth daily. 08/27/20  Yes [provider]  tiZANidine (ZANAFLEX) 2 MG tablet Take 2 mg by mouth 2 (two) times daily. 08/20/21  Yes [provider]  TRESIBA FLEXTOUCH 200 UNIT/ML FlexTouch Pen Inject 120 Units into the skin at bedtime. 04/22/21  Yes [provider]  triamcinolone cream (KENALOG) 0.1 % Apply 1 application topically 2 (two) times daily as needed (dry skin Itching). 12/30/17  Yes [provider]  venlafaxine (EFFEXOR) 75 MG tablet Take 75 mg by mouth 3 (three) times daily with meals.    Yes [provider]  XIFAXAN 550 MG TABS tablet TAKE 1 TABLET BY MOUTH TWICE DAILY Patient taking differently: Take 550 mg by mouth 2 (two) times  daily. 02/13/21  Yes Mahala Menghini, PA-C  BD PEN NEEDLE MICRO U/F 32G X 6 MM MISC SMARTSIG:SUB-Q Daily PRN 04/23/21  [provider]  Continuous Blood Gluc Sensor (FREESTYLE LIBRE 2 SENSOR) MISC Apply topically. 10/15/21   [provider]  GLUCAGEN HYPOKIT 1 MG SOLR injection Inject 1 mg into the muscle once as needed for low blood sugar. 11/28/20   [provider]  Milk Thistle 250 MG CAPS Take 250 mg by mouth in the morning, at noon, and at bedtime.    [provider]   Current Facility-Administered Medications  Medication Dose Route Frequency Provider Last Rate Last Admin   0.9 %  sodium chloride infusion (Manually program via Guardrails IV Fluids)   Intravenous Once Zierle-Ghosh, Asia B, DO       allopurinol (ZYLOPRIM) tablet 300 mg  300 mg Oral Daily Emokpae, Ejiroghene E, MD   300 mg at 02/03/22 8588   Chlorhexidine Gluconate Cloth 2 % PADS 6 each  6 each Topical Q0600 Donato Heinz, MD   6 each at 02/03/22 1105   diltiazem (CARDIZEM CD) 24 hr capsule 120 mg  120 mg Oral Daily Madueme, Elvira C, RPH   120 mg at 02/03/22 0902   famotidine (PEPCID) tablet 20 mg  20 mg Oral Daily Johnson, Clanford L, MD   20 mg at 02/03/22 0902   insulin aspart (novoLOG) injection 0-5 Units  0-5 Units Subcutaneous QHS Emokpae, Ejiroghene E, MD       insulin aspart (novoLOG) injection 0-9 Units  0-9 Units Subcutaneous TID WC Emokpae, Ejiroghene E, MD       lactulose (CHRONULAC) 10 GM/15ML solution 10 g  10 g Oral BID Johnson, Clanford L, MD       loratadine (CLARITIN) tablet 10 mg  10 mg Oral Daily Emokpae, Ejiroghene E, MD   10 mg at 02/03/22 5027   metoprolol succinate (TOPROL-XL) 24 hr tablet 25 mg  25 mg Oral QHS Emokpae, Ejiroghene E, MD   25 mg at 02/02/22 2153   metoprolol succinate (TOPROL-XL) 24hr tablet  '25mg'$  **TO BE GIVEN IN AM OF NON-DIALYSIS DAYS **  25 mg Oral Once per day on Sun Tue Thu Sat [place] Brick, Ejiroghene E, MD       rifaximin Doreene Nest) tablet 550 mg   550 mg Oral BID Emokpae, Ejiroghene E, MD   550 mg at 02/03/22 0903   Labs: Basic Metabolic Panel: Recent Labs  Lab 02/02/22 1327 02/03/22 0333  NA 133* 133*  K 4.2 4.2  CL 96* 99  CO2 25 25  GLUCOSE 158* 110*  BUN 34* 39*  CREATININE 4.43* 4.96*  CALCIUM 9.2 9.0   Liver Function Tests: Recent Labs  Lab 02/02/22 1327  AST 46*  ALT 32  ALKPHOS 144*  BILITOT 1.2  PROT 6.6  ALBUMIN 2.8*   No results for input(s): "LIPASE", "AMYLASE" in the last 168 hours. Recent Labs  Lab 02/02/22 1346 02/03/22 0806  AMMONIA 65* 64*   CBC: Recent Labs  Lab 02/02/22 1327 02/03/22 0333  WBC 3.5* 2.9*  NEUTROABS 3.0  --   HGB 7.1* 6.4*  HCT 22.9* 20.7*  MCV 106.5* 107.3*  PLT 111* 89*   Cardiac Enzymes: No results for input(s): "CKTOTAL", "CKMB", "CKMBINDEX", "TROPONINI" in the last 168 hours. CBG: Recent Labs  Lab 02/02/22 1154 02/02/22 2146 02/03/22 0743  GLUCAP 155* 139* 103*   Iron Studies: No results for input(s): "IRON", "TIBC", "TRANSFERRIN", "FERRITIN" in the last 72 hours. Studies/Results: CT HEAD WO CONTRAST (5MM)  Result Date: 02/03/2022 CLINICAL DATA:  Initial evaluation for encephalopathy, multiple falls. EXAM: CT HEAD WITHOUT CONTRAST TECHNIQUE: Contiguous axial  images were obtained from the base of the skull through the vertex without intravenous contrast. RADIATION DOSE REDUCTION: This exam was performed according to the departmental dose-optimization program which includes automated exposure control, adjustment of the mA and/or kV according to patient size and/or use of iterative reconstruction technique. COMPARISON:  Prior CT from 04/01/2020. FINDINGS: Brain: Age-related cerebral atrophy with chronic small vessel ischemic disease. Small remote left cerebellar infarct noted, stable. No acute intracranial hemorrhage. No acute large vessel territory infarct. No mass lesion, midline shift or mass effect. No hydrocephalus or extra-axial fluid collection. Vascular: No  abnormal hyperdense vessel. Scattered vascular calcifications noted within the carotid siphons. Skull: Scalp soft tissues demonstrate no acute finding. Calvarium intact. Sinuses/Orbits: Globes orbital soft tissues within normal limits. Paranasal sinuses and mastoid air cells are clear. Other: None. IMPRESSION: 1. No acute intracranial abnormality. 2. Age-related cerebral atrophy with chronic small vessel ischemic disease, with a small remote left cerebellar infarct. Electronically Signed   By: Jeannine Boga M.D.   On: 02/03/2022 01:49   DG Chest 2 View  Result Date: 02/02/2022 CLINICAL DATA:  Dialysis, missed dialysis, weakness, multiple falls EXAM: CHEST - 2 VIEW COMPARISON:  08/10/2021 FINDINGS: Gross cardiomegaly. Layering bilateral pleural effusions, similar to prior examination. Pulmonary vascular prominence. Osseous structures unremarkable. IMPRESSION: Gross cardiomegaly with pulmonary vascular prominence and layering bilateral pleural effusions, similar to prior examination. Electronically Signed   By: Delanna Ahmadi M.D.   On: 02/02/2022 15:44    ROS: Pertinent items are noted in HPI. Physical Exam: Vitals:   02/02/22 2148 02/03/22 0241 02/03/22 0454 02/03/22 0902  BP: (!) 144/66 (!) 130/59 130/66 129/62  Pulse: 73 70 70   Resp: '17 16 15   '$ Temp: (!) 97.2 F (36.2 C) (!) 97.2 F (36.2 C) (!) 96.9 F (36.1 C)   TempSrc:      SpO2: 100% 100% 100%   Weight:      Height:          Weight change:   Intake/Output Summary (Last 24 hours) at 02/03/2022 1109 Last data filed at 02/03/2022 0900 Gross per 24 hour  Intake 440 ml  Output 150 ml  Net 290 ml   BP 129/62   Pulse 70   Temp (!) 96.9 F (36.1 C)   Resp 15   Ht '5\' 5"'$  (1.651 m)   Wt 84 kg   SpO2 100%   BMI 30.82 kg/m  General appearance: alert, cooperative, and no distress Head: Normocephalic, without obvious abnormality, atraumatic Resp: clear to auscultation bilaterally Cardio: regular rate and rhythm, S1, S2 normal,  no murmur, click, rub or gallop GI: soft, non-tender; bowel sounds normal; no masses,  no organomegaly Extremities: extremities normal, atraumatic, no cyanosis or edema and LUE AVF +T/B Dialysis Access:  LUE AVF  Dialysis Orders: Center: [facility]  on MWF . EDW 78.5 kg HD Bath 2K/2.5Ca  Time 4 hours Heparin none. Access LUE AVF BFR 350  DFR 500    Micera 200 mcg IV every 2 weeks  Venofer  50 mg IV weekly  Assessment/Plan:  Acute on chronic anemia - due to chronic blood loss.  Plan for transfusion today with HD.  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  Has improved today and is at baseline mentally.  ESRD -  Plan for HD today since he missed yesterday and will require blood transfusion.  Will get back on schedule later this week.  Hypertension/volume  -  stable  Anemia  - as above for blood  transfusion.  Metabolic bone disease -   continue with home meds  Nutrition -  renal diet  Donetta Potts, MD [place] 02/03/2022, 11:09 AM

## 2022-02-03 NOTE — Assessment & Plan Note (Signed)
--   will need to follow up with GI outpatient for ongoing management

## 2022-02-03 NOTE — Care Management Obs Status (Signed)
[place] NOTIFICATION   Patient Details  Name: Jeffrey [name] MRN: [id number] Date of Birth: 09-Sep-1945   Medicare Observation Status Notification Given:  Yes    [name] 02/03/2022, 4:51 PM

## 2022-02-03 NOTE — Assessment & Plan Note (Addendum)
--  suspect acutely exacerbated by anemia --Hg down to 6.4 --transfused 2 units PRBC --PT eval>>SNF

## 2022-02-03 NOTE — Procedures (Signed)
   HEMODIALYSIS TREATMENT NOTE:   3.5 hour heparin-free treatment completed using left upper arm AVF (15g/antegrade). 2 units pRBC transfused uneventfully.  I may have committed a charting error on the blood administration flow sheet.  First unit transfused from [phone] and the second unit from [phone].  Goal met: net UF 1 liter. Hemodynamically stable.  No changes from pre-HD assessment.  Hand-off given to Nigel Sloop, LPN.   Rockwell Alexandria, RN

## 2022-02-04 DIAGNOSIS — G9341 Metabolic encephalopathy: Secondary | ICD-10-CM | POA: Diagnosis not present

## 2022-02-04 DIAGNOSIS — D62 Acute posthemorrhagic anemia: Secondary | ICD-10-CM

## 2022-02-04 DIAGNOSIS — D61818 Other pancytopenia: Secondary | ICD-10-CM

## 2022-02-04 DIAGNOSIS — K7030 Alcoholic cirrhosis of liver without ascites: Secondary | ICD-10-CM | POA: Diagnosis not present

## 2022-02-04 DIAGNOSIS — K7682 Hepatic encephalopathy: Secondary | ICD-10-CM | POA: Diagnosis not present

## 2022-02-04 LAB — HEPATIC FUNCTION PANEL
ALT: 25 U/L (ref 0–44)
AST: 37 U/L (ref 15–41)
Albumin: 2.4 g/dL — ABNORMAL LOW (ref 3.5–5.0)
Alkaline Phosphatase: 122 U/L (ref 38–126)
Bilirubin, Direct: 0.3 mg/dL — ABNORMAL HIGH (ref 0.0–0.2)
Indirect Bilirubin: 1.1 mg/dL — ABNORMAL HIGH (ref 0.3–0.9)
Total Bilirubin: 1.4 mg/dL — ABNORMAL HIGH (ref 0.3–1.2)
Total Protein: 5.9 g/dL — ABNORMAL LOW (ref 6.5–8.1)

## 2022-02-04 LAB — BPAM RBC
Blood Product Expiration Date: 202309112359
Blood Product Expiration Date: 202310062359
ISSUE DATE / TIME: 202309051427
ISSUE DATE / TIME: 202309051600
Unit Type and Rh: 5100
Unit Type and Rh: 9500

## 2022-02-04 LAB — TYPE AND SCREEN
ABO/RH(D): O POS
Antibody Screen: NEGATIVE
Unit division: 0
Unit division: 0

## 2022-02-04 LAB — RENAL FUNCTION PANEL
Albumin: 2.4 g/dL — ABNORMAL LOW (ref 3.5–5.0)
Anion gap: 10 (ref 5–15)
BUN: 20 mg/dL (ref 8–23)
CO2: 26 mmol/L (ref 22–32)
Calcium: 8 mg/dL — ABNORMAL LOW (ref 8.9–10.3)
Chloride: 97 mmol/L — ABNORMAL LOW (ref 98–111)
Creatinine, Ser: 2.92 mg/dL — ABNORMAL HIGH (ref 0.61–1.24)
GFR, Estimated: 22 mL/min — ABNORMAL LOW (ref >60)
Glucose, Bld: 166 mg/dL — ABNORMAL HIGH (ref 70–99)
Phosphorus: 3 mg/dL (ref 2.5–4.6)
Potassium: 3 mmol/L — ABNORMAL LOW (ref 3.5–5.1)
Sodium: 133 mmol/L — ABNORMAL LOW (ref 135–145)

## 2022-02-04 LAB — CBC
HCT: 27.5 % — ABNORMAL LOW (ref 39.0–52.0)
Hemoglobin: 8.8 g/dL — ABNORMAL LOW (ref 13.0–17.0)
MCH: 31.4 pg (ref 26.0–34.0)
MCHC: 32 g/dL (ref 30.0–36.0)
MCV: 98.2 fL (ref 80.0–100.0)
Platelets: 85 10*3/uL — ABNORMAL LOW (ref 150–400)
RBC: 2.8 MIL/uL — ABNORMAL LOW (ref 4.22–5.81)
RDW: 22.8 % — ABNORMAL HIGH (ref 11.5–15.5)
WBC: 2.5 10*3/uL — ABNORMAL LOW (ref 4.0–10.5)
nRBC: 0 % (ref 0.0–0.2)

## 2022-02-04 LAB — HEPATITIS B SURFACE ANTIBODY, QUANTITATIVE: Hep B S AB Quant (Post): 5.9 m[IU]/mL — ABNORMAL LOW (ref >9.9)

## 2022-02-04 LAB — HEPATITIS B SURFACE ANTIBODY,QUALITATIVE: Hep B S Ab: NONREACTIVE

## 2022-02-04 LAB — GLUCOSE, CAPILLARY
Glucose-Capillary: 175 mg/dL — ABNORMAL HIGH (ref 70–99)
Glucose-Capillary: 192 mg/dL — ABNORMAL HIGH (ref 70–99)
Glucose-Capillary: 204 mg/dL — ABNORMAL HIGH (ref 70–99)
Glucose-Capillary: 197 mg/dL — ABNORMAL HIGH (ref 70–99)

## 2022-02-04 LAB — HEPATITIS C ANTIBODY: HCV Ab: NONREACTIVE

## 2022-02-04 LAB — HEPATITIS B CORE ANTIBODY, TOTAL: Hep B Core Total Ab: NONREACTIVE

## 2022-02-04 LAB — AMMONIA: Ammonia: 74 umol/L — ABNORMAL HIGH (ref 9–35)

## 2022-02-04 MED ORDER — MEROPENEM 500 MG IV SOLR
500.0000 mg | INTRAVENOUS | Status: DC
  Administered 2022-02-04 – 2022-02-06 (×3): 500 mg via INTRAVENOUS
  Filled 2022-02-04 (×5): qty 10

## 2022-02-04 NOTE — Progress Notes (Signed)
Patient ID: Jeffrey [name], male   DOB: 1946/01/14, 76 y.o.   MRN: [id number] S: Feels a little better today.  Tolerated HD and received 2 units PRBC's yesterday. O:BP (!) 143/64   Pulse 77   Temp 98.7 F (37.1 C)   Resp 17   Ht '5\' 5"'$  (1.651 m)   Wt 84.7 kg   SpO2 99%   BMI 31.07 kg/m   Intake/Output Summary (Last 24 hours) at 02/04/2022 1103 Last data filed at 02/04/2022 0700 Gross per 24 hour  Intake 780 ml  Output 1200 ml  Net -420 ml   Intake/Output: I/O last 3 completed shifts: In: 1220 [P.O.:590; Blood:630] Out: 1350 [Urine:150; RCVEL:3810]  Intake/Output this shift:  No intake/output data recorded. Weight change: 0.7 kg Gen: NAD CVS: RRR Resp: CTA Abd: +BS, soft, NT Ext: no edema, LUE AVF +T/B  Recent Labs  Lab 02/02/22 1327 02/03/22 0333 02/04/22 0250 02/04/22 0301  NA 133* 133*  --  133*  K 4.2 4.2  --  3.0*  CL 96* 99  --  97*  CO2 25 25  --  26  GLUCOSE 158* 110*  --  166*  BUN 34* 39*  --  20  CREATININE 4.43* 4.96*  --  2.92*  ALBUMIN 2.8*  --  2.4* 2.4*  CALCIUM 9.2 9.0  --  8.0*  PHOS  --   --   --  3.0  AST 46*  --  37  --   ALT 32  --  25  --    Liver Function Tests: Recent Labs  Lab 02/02/22 1327 02/04/22 0250 02/04/22 0301  AST 46* 37  --   ALT 32 25  --   ALKPHOS 144* 122  --   BILITOT 1.2 1.4*  --   PROT 6.6 5.9*  --   ALBUMIN 2.8* 2.4* 2.4*   No results for input(s): "LIPASE", "AMYLASE" in the last 168 hours. Recent Labs  Lab 02/02/22 1346 02/03/22 0806 02/04/22 0301  AMMONIA 65* 64* 74*   CBC: Recent Labs  Lab 02/02/22 1327 02/03/22 0333 02/03/22 1929 02/04/22 0301  WBC 3.5* 2.9*  --  2.5*  NEUTROABS 3.0  --   --   --   HGB 7.1* 6.4* 8.7* 8.8*  HCT 22.9* 20.7* 27.0* 27.5*  MCV 106.5* 107.3*  --  98.2  PLT 111* 89*  --  85*   Cardiac Enzymes: No results for input(s): "CKTOTAL", "CKMB", "CKMBINDEX", "TROPONINI" in the last 168 hours. CBG: Recent Labs  Lab 02/03/22 1131 02/03/22 1755 02/03/22 2028 02/04/22 0720  02/04/22 1100  GLUCAP 120* 110* 97 175* 192*    Iron Studies: No results for input(s): "IRON", "TIBC", "TRANSFERRIN", "FERRITIN" in the last 72 hours. Studies/Results: CT HEAD WO CONTRAST (5MM)  Result Date: 02/03/2022 CLINICAL DATA:  Initial evaluation for encephalopathy, multiple falls. EXAM: CT HEAD WITHOUT CONTRAST TECHNIQUE: Contiguous axial images were obtained from the base of the skull through the vertex without intravenous contrast. RADIATION DOSE REDUCTION: This exam was performed according to the departmental dose-optimization program which includes automated exposure control, adjustment of the mA and/or kV according to patient size and/or use of iterative reconstruction technique. COMPARISON:  Prior CT from 04/01/2020. FINDINGS: Brain: Age-related cerebral atrophy with chronic small vessel ischemic disease. Small remote left cerebellar infarct noted, stable. No acute intracranial hemorrhage. No acute large vessel territory infarct. No mass lesion, midline shift or mass effect. No hydrocephalus or extra-axial fluid collection. Vascular: No abnormal hyperdense vessel. Scattered vascular calcifications  noted within the carotid siphons. Skull: Scalp soft tissues demonstrate no acute finding. Calvarium intact. Sinuses/Orbits: Globes orbital soft tissues within normal limits. Paranasal sinuses and mastoid air cells are clear. Other: None. IMPRESSION: 1. No acute intracranial abnormality. 2. Age-related cerebral atrophy with chronic small vessel ischemic disease, with a small remote left cerebellar infarct. Electronically Signed   By: Jeannine Boga M.D.   On: 02/03/2022 01:49   DG Chest 2 View  Result Date: 02/02/2022 CLINICAL DATA:  Dialysis, missed dialysis, weakness, multiple falls EXAM: CHEST - 2 VIEW COMPARISON:  08/10/2021 FINDINGS: Gross cardiomegaly. Layering bilateral pleural effusions, similar to prior examination. Pulmonary vascular prominence. Osseous structures unremarkable.  IMPRESSION: Gross cardiomegaly with pulmonary vascular prominence and layering bilateral pleural effusions, similar to prior examination. Electronically Signed   By: Delanna Ahmadi M.D.   On: 02/02/2022 15:44    sodium chloride   Intravenous Once   sodium chloride   Intravenous Once   allopurinol  300 mg Oral Daily   Chlorhexidine Gluconate Cloth  6 each Topical Q0600   diltiazem  120 mg Oral Daily   famotidine  20 mg Oral Daily   insulin aspart  0-5 Units Subcutaneous QHS   insulin aspart  0-9 Units Subcutaneous TID WC   lactulose  10 g Oral BID   loratadine  10 mg Oral Daily   metoprolol succinate  25 mg Oral QHS   metoprolol succinate  25 mg Oral Once per day on Sun Mon Wed Fri   rifaximin  550 mg Oral BID    BMET    Component Value Date/Time   NA 133 (L) 02/04/2022 0301   K 3.0 (L) 02/04/2022 0301   CL 97 (L) 02/04/2022 0301   CO2 26 02/04/2022 0301   GLUCOSE 166 (H) 02/04/2022 0301   BUN 20 02/04/2022 0301   CREATININE 2.92 (H) 02/04/2022 0301   CREATININE 2.65 (H) 11/16/2019 1203   CALCIUM 8.0 (L) 02/04/2022 0301   CALCIUM 8.3 (L) 09/19/2020 1024   GFRNONAA 22 (L) 02/04/2022 0301   GFRNONAA 23 (L) 11/16/2019 1203   GFRAA 15 (L) 02/20/2020 0636   GFRAA 27 (L) 11/16/2019 1203   CBC    Component Value Date/Time   WBC 2.5 (L) 02/04/2022 0301   RBC 2.80 (L) 02/04/2022 0301   HGB 8.8 (L) 02/04/2022 0301   HCT 27.5 (L) 02/04/2022 0301   HCT 28 02/25/2012 1034   PLT 85 (L) 02/04/2022 0301   PLT 125 02/25/2012 1034   MCV 98.2 02/04/2022 0301   MCV 82.0 02/25/2012 1034   MCH 31.4 02/04/2022 0301   MCHC 32.0 02/04/2022 0301   RDW 22.8 (H) 02/04/2022 0301   LYMPHSABS 0.2 (L) 02/02/2022 1327   MONOABS 0.2 02/02/2022 1327   EOSABS 0.0 02/02/2022 1327   BASOSABS 0.0 02/02/2022 1327    Dialysis Orders: Center: [facility]  on MWF . EDW 78.5 kg HD Bath 2K/2.5Ca  Time 4 hours Heparin none. Access LUE AVF BFR 350  DFR 500    Micera 200 mcg IV every 2 weeks  Venofer  50 mg  IV weekly   Assessment/Plan:  Acute on chronic anemia - due to chronic blood loss.  IMproved with blood transfusion 02/03/22.  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  Has improved today and is at baseline mentally.  ESRD -  Normally MWF but off schedule.  Had HD yesterday and plan for HD again tomorrow.  Will get back on MWF schedule this week if discharged tomorrow, otherwise  will plan for TTS and get back on MWF next week.   Hypertension/volume  -  stable  Anemia  - as above for blood transfusion.  Metabolic bone disease -   continue with home meds  Nutrition -  renal diet  Weakness and falls - likely due to anemia but will need PT/OT evaluation.   Donetta Potts, MD [facility]

## 2022-02-04 NOTE — Assessment & Plan Note (Addendum)
Multifactorial including UTI, hyperammonemia, ABLA -continue merrem -transfused 2 units PRBC -continue lactulose Mental status is now back to baseline

## 2022-02-04 NOTE — Progress Notes (Signed)
Pharmacy Antibiotic Note  Jeffrey [name] is a 76 y.o. male admitted on 02/02/2022 with  ESBL E.Coli UTI .  Pharmacy has been consulted for Merrem dosing. ESRD on HD  Plan: Merrem '500mg'$  IV q24h F/U cxs and clinical progress Monitor V/S, labs  Height: '5\' 5"'$  (165.1 cm) Weight: 84.7 kg (186 lb 11.7 oz) IBW/kg (Calculated) : 61.5  Temp (24hrs), Avg:98.6 F (37 C), Min:98.2 F (36.8 C), Max:99 F (37.2 C)  Recent Labs  Lab 02/02/22 1327 02/03/22 0333 02/04/22 0301  WBC 3.5* 2.9* 2.5*  CREATININE 4.43* 4.96* 2.92*    Estimated Creatinine Clearance: 21.9 mL/min (A) (by C-G formula based on SCr of 2.92 mg/dL (H)).    Allergies  Allergen Reactions   Tape Itching, Rash and Other (See Comments)    Tears skin    Antimicrobials this admission: Merrem 9/6 >>   Microbiology results: 9/4 UCX: E.Coli  ESBL >100K CFU/ml S- imipenem R- septra E.Faecium 60K CFU/ml  Thank you for allowing pharmacy to be a part of this patient's care.  Isac Sarna, BS Pharm D, BCPS Clinical Pharmacist 02/04/2022 3:20 PM

## 2022-02-04 NOTE — Progress Notes (Signed)
PROGRESS NOTE  Jeffrey [name] FGH:[id number] DOB: [birth year] DOA: 02/02/2022 PCP: Monico Blitz, MD  Brief History:  76 y.o. 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  Patient was brought to the ED with reports of weakness over the past few weeks and multiple falls recently.  At the time of my evaluation, patient is alone,  awake, but appears lethargic, slightly drowsy, answers a few questions appropriately, but also appears slightly confused.    I talked to patient's spouse on the phone, she confirms drowsiness, and intermittent confusion over the past few days.  She reports over the past 2 weeks, patient has been having at least 2-3 watery stools every day, so she did not give him his lactulose the past 2 weeks, but he was getting his rifaximin.  At baseline patient has 1-2 bowel movements a day and usually not watery, so and she was concerned for C. difficile infection.  Patient is also taking colchicine for gout, he has been on it for about 6 weeks now.     Patient's HD schedule is Monday Wednesday Friday, he did not get dialyzed today, instead he came to the ED.  Patient denies any pain to me.  Reports a chronic mild cough that is unchanged.  He does make some urine, he denies any urinary symptoms at this time.     Stopped drinking alcohol 2014 per spouse.   ED Course: Tmax 98.8.  Heart rate 69-75.  Respiratory rate 12-20.  Blood pressure systolic 678-938.  O2 sats greater than 93% on 3 L. Ammonia level 65. Hemoglobin 7.1. UA with large leukocytes many bacteria greater than 50 WBCs. 2 view chest x-ray-gross cardiomegaly with pulmonary vascular prominence and layering bilateral pleural effusions similar to prior exam. Zosyn started for UTI.  Hospitalist to admit.    Assessment/Plan:   Principal Problem:   Acute metabolic encephalopathy Active Problems:   Generalized weakness   ESRD on dialysis ([place] [facility])   Acute hepatic encephalopathy (HCC)   Falls   Pancytopenia (HCC)   Diarrhea in adult patient   Essential hypertension, benign   Alcoholic cirrhosis (HCC)   Iron deficiency anemia due to chronic blood loss  Assessment and Plan: * Acute metabolic encephalopathy Multifactorial including UTI, hyperammonemia, ABLA -start merrem -transfused 2 units PRBC -continue lactulose Mental status is improving  Generalized weakness --suspect acutely exacerbated by anemia --Hg down to 6.4 --transfused 2 units PRBC --PT eval  Acute hepatic encephalopathy ([place]) He presented lethargic with generalized weakness.  Also reports of multiple falls.  Ammonia level elevated at 65, higher than baseline -Zosyn given in ED, will hold off on further antibiotics at this time -Lactulose ordered and rifaximin. -head CT: no acute findings -Hold gabapentin, as needed Xanax, hydroxyzine, Percocet, mirtazapine, tizanidine, Effexor  ESRD on dialysis [facility]) Schedule Monday Wednesday Friday.  Missed HD today.  Chest X-ray with stable layering bilateral effusions.  -nephrology consultation appreciated  Falls Multiple falls and generalized weakness.  Falls weakness, spouse reports patient did not hit his head. -CT head no acute findings  Iron deficiency anemia due to chronic blood loss Hemoglobin 6.4, baseline 7-8. -transfuse 1 unit PRBC with hemodialysis (already ordered by night coverage) - hold subcutaneous heparin due to drop in Hg  Alcoholic cirrhosis ([place]) -- will need to follow up with GI outpatient for  ongoing management   Essential hypertension, benign Stable. -Resume diltiazem, metoprolol, - Hold Lasix 80 mg daily, spironolactone  Diarrhea in adult patient 2-3 episodes of watery stools over the past 2 weeks.  So has not taken Constulose in 2 weeks also.   History of C. difficile infection.  Afebrile.  Stable mild leukopenia, no abdominal pain.  Was prescribed colchicine about 6 weeks ago for gout, which could have precipitated the diarrhea -Stop colchicine for now  Pancytopenia ([place]) -Follow CBC    Family Communication:   spouse updated 9/6  Consultants:  renal  Code Status:  FULL   DVT Prophylaxis:  SCDs   Procedures: As Listed in Progress Note Above  Antibiotics: None     Subjective: Patient denies fevers, chills, headache, chest pain, dyspnea, nausea, vomiting, diarrhea, abdominal pain, dysuria, hematuria, hematochezia, and melena.   Objective: Vitals:   02/03/22 2056 02/04/22 0605 02/04/22 0818 02/04/22 1353  BP: (!) 132/55 139/61 (!) 143/64 (!) 150/65  Pulse: 73 71 77 77  Resp: 18 17    Temp: 98.5 F (36.9 C) 98.7 F (37.1 C)  99 F (37.2 C)  TempSrc: Oral   Oral  SpO2: 100% 99%  99%  Weight:      Height:        Intake/Output Summary (Last 24 hours) at 02/04/2022 1528 Last data filed at 02/04/2022 0746 Gross per 24 hour  Intake 555 ml  Output 800 ml  Net -245 ml   Weight change: 0.7 kg Exam:  General:  Pt is alert, follows commands appropriately, not in acute distress HEENT: No icterus, No thrush, No neck mass, [place]/AT Cardiovascular: RRR, S1/S2, no rubs, no gallops Respiratory: bibasilar rales. No wheeze Abdomen: Soft/+BS, non tender, non distended, no guarding Extremities: No edema, No lymphangitis, No petechiae, No rashes, no synovitis   Data Reviewed: I have personally reviewed following labs and imaging studies Basic Metabolic Panel: Recent Labs  Lab 02/02/22 1327 02/03/22 0333 02/04/22 0301  NA 133* 133* 133*  K 4.2 4.2 3.0*  CL 96* 99 97*  CO2 '25 25 26  '$ GLUCOSE 158* 110* 166*  BUN 34* 39* 20  CREATININE 4.43* 4.96* 2.92*  CALCIUM 9.2 9.0 8.0*  MG 1.7  --   --   PHOS  --   --  3.0   Liver Function Tests: Recent Labs  Lab 02/02/22 1327 02/04/22 0250 02/04/22 0301  AST 46* 37   --   ALT 32 25  --   ALKPHOS 144* 122  --   BILITOT 1.2 1.4*  --   PROT 6.6 5.9*  --   ALBUMIN 2.8* 2.4* 2.4*   No results for input(s): "LIPASE", "AMYLASE" in the last 168 hours. Recent Labs  Lab 02/02/22 1346 02/03/22 0806 02/04/22 0301  AMMONIA 65* 64* 74*   Coagulation Profile: No results for input(s): "INR", "PROTIME" in the last 168 hours. CBC: Recent Labs  Lab 02/02/22 1327 02/03/22 0333 02/03/22 1929 02/04/22 0301  WBC 3.5* 2.9*  --  2.5*  NEUTROABS 3.0  --   --   --   HGB 7.1* 6.4* 8.7* 8.8*  HCT 22.9* 20.7* 27.0* 27.5*  MCV 106.5* 107.3*  --  98.2  PLT 111* 89*  --  85*   Cardiac Enzymes: No results for input(s): "CKTOTAL", "CKMB", "CKMBINDEX", "TROPONINI" in the last 168 hours. BNP: Invalid input(s): "POCBNP" CBG: Recent Labs  Lab 02/03/22 1131 02/03/22 1755 02/03/22 2028 02/04/22 0720 02/04/22 1100  GLUCAP 120* 110* 97 175* 192*  HbA1C: No results for input(s): "HGBA1C" in the last 72 hours. Urine analysis:    Component Value Date/Time   COLORURINE YELLOW 02/02/2022 1327   APPEARANCEUR CLOUDY (A) 02/02/2022 1327   LABSPEC 1.011 02/02/2022 1327   PHURINE 5.0 02/02/2022 1327   GLUCOSEU NEGATIVE 02/02/2022 1327   HGBUR MODERATE (A) 02/02/2022 1327   BILIRUBINUR NEGATIVE 02/02/2022 1327   KETONESUR NEGATIVE 02/02/2022 1327   PROTEINUR 100 (A) 02/02/2022 1327   NITRITE NEGATIVE 02/02/2022 1327   LEUKOCYTESUR LARGE (A) 02/02/2022 1327   Sepsis Labs: '@LABRCNTIP'$ (procalcitonin:4,lacticidven:4) ) Recent Results (from the past 240 hour(s))  Urine Culture     Status: Abnormal (Preliminary result)   Collection Time: 02/02/22  3:14 PM   Specimen: Urine, Catheterized  Result Value Ref Range Status   Specimen Description   Final    URINE, CATHETERIZED Performed at [facility], [address] [place], [place] [address]    Special Requests   Final    NONE Performed at [facility], [address] [place], [place] [address]    Culture (A)   Final    >=100,000 COLONIES/mL ESCHERICHIA COLI Confirmed Extended Spectrum Beta-Lactamase Producer (ESBL).  In bloodstream infections from ESBL organisms, carbapenems are preferred over piperacillin/tazobactam. They are shown to have a lower risk of mortality. 60,000 COLONIES/mL ENTEROCOCCUS FAECIUM SUSCEPTIBILITIES TO FOLLOW Performed at [facility], [place] [address] [place],  [address]    Report Status PENDING  Incomplete   Organism ID, Bacteria ESCHERICHIA COLI (A)  Final      Susceptibility   Escherichia coli - MIC*    AMPICILLIN >=32 RESISTANT Resistant     CEFAZOLIN >=64 RESISTANT Resistant     CEFEPIME 4 INTERMEDIATE Intermediate     CEFTRIAXONE >=64 RESISTANT Resistant     CIPROFLOXACIN >=4 RESISTANT Resistant     GENTAMICIN <=1 SENSITIVE Sensitive     IMIPENEM 0.5 SENSITIVE Sensitive     NITROFURANTOIN <=16 SENSITIVE Sensitive     TRIMETH/SULFA >=320 RESISTANT Resistant     AMPICILLIN/SULBACTAM 8 SENSITIVE Sensitive     PIP/TAZO <=4 SENSITIVE Sensitive     * >=100,000 COLONIES/mL ESCHERICHIA COLI     Scheduled Meds:  sodium chloride   Intravenous Once   sodium chloride   Intravenous Once   allopurinol  300 mg Oral Daily   Chlorhexidine Gluconate Cloth  6 each Topical Q0600   diltiazem  120 mg Oral Daily   famotidine  20 mg Oral Daily   insulin aspart  0-5 Units Subcutaneous QHS   insulin aspart  0-9 Units Subcutaneous TID WC   lactulose  10 g Oral BID   loratadine  10 mg Oral Daily   metoprolol succinate  25 mg Oral QHS   metoprolol succinate  25 mg Oral Once per day on Sun Mon Wed Fri   rifaximin  550 mg Oral BID   Continuous Infusions:  meropenem (MERREM) IV      Procedures/Studies: CT HEAD WO CONTRAST (5MM)  Result Date: 02/03/2022 CLINICAL DATA:  Initial evaluation for encephalopathy, multiple falls. EXAM: CT HEAD WITHOUT CONTRAST TECHNIQUE: Contiguous axial images were obtained from the base of the skull through the vertex without intravenous  contrast. RADIATION DOSE REDUCTION: This exam was performed according to the departmental dose-optimization program which includes automated exposure control, adjustment of the mA and/or kV according to patient size and/or use of iterative reconstruction technique. COMPARISON:  Prior CT from 04/01/2020. FINDINGS: Brain: Age-related cerebral atrophy with chronic small vessel ischemic disease. Small remote left  cerebellar infarct noted, stable. No acute intracranial hemorrhage. No acute large vessel territory infarct. No mass lesion, midline shift or mass effect. No hydrocephalus or extra-axial fluid collection. Vascular: No abnormal hyperdense vessel. Scattered vascular calcifications noted within the carotid siphons. Skull: Scalp soft tissues demonstrate no acute finding. Calvarium intact. Sinuses/Orbits: Globes orbital soft tissues within normal limits. Paranasal sinuses and mastoid air cells are clear. Other: None. IMPRESSION: 1. No acute intracranial abnormality. 2. Age-related cerebral atrophy with chronic small vessel ischemic disease, with a small remote left cerebellar infarct. Electronically Signed   By: Jeannine Boga M.D.   On: 02/03/2022 01:49   DG Chest 2 View  Result Date: 02/02/2022 CLINICAL DATA:  Dialysis, missed dialysis, weakness, multiple falls EXAM: CHEST - 2 VIEW COMPARISON:  08/10/2021 FINDINGS: Gross cardiomegaly. Layering bilateral pleural effusions, similar to prior examination. Pulmonary vascular prominence. Osseous structures unremarkable. IMPRESSION: Gross cardiomegaly with pulmonary vascular prominence and layering bilateral pleural effusions, similar to prior examination. Electronically Signed   By: Delanna Ahmadi M.D.   On: 02/02/2022 15:44   US Paracentesis  Result Date: 01/15/2022 INDICATION: Recurrent ascites, cirrhosis, last para 11/20/21 EXAM: ULTRASOUND GUIDED  PARACENTESIS MEDICATIONS: None. COMPLICATIONS: None immediate. PROCEDURE: Informed written consent was  obtained from the patient after a discussion of the risks, benefits and alternatives to treatment. A timeout was performed prior to the initiation of the procedure. Initial ultrasound scanning demonstrates a moderate amount of ascites within the left lower abdominal quadrant. The left lower abdomen was prepped and draped in the usual sterile fashion. 1% lidocaine was used for local anesthesia. Following this, a 19 gauge, 7-cm, Yueh catheter was introduced. An ultrasound image was saved for documentation purposes. The paracentesis was performed. The catheter was removed and a dressing was applied. The patient tolerated the procedure well without immediate post procedural complication. FINDINGS: A total of approximately 800cc of turbid fluid was removed. Samples were sent to the laboratory as requested by the clinical team. IMPRESSION: Successful ultrasound-guided paracentesis yielding 0.8 liters of peritoneal fluid. Electronically Signed   By: Lavonia Dana M.D.   On: 01/15/2022 13:07    Orson Eva, DO  [facility]  If 7PM-7AM, please contact night-coverage [email or web address] Password [email or web address] 02/04/2022, 3:28 PM   LOS: 0 days

## 2022-02-05 DIAGNOSIS — N390 Urinary tract infection, site not specified: Secondary | ICD-10-CM | POA: Diagnosis present

## 2022-02-05 DIAGNOSIS — R296 Repeated falls: Secondary | ICD-10-CM | POA: Diagnosis present

## 2022-02-05 DIAGNOSIS — N186 End stage renal disease: Secondary | ICD-10-CM | POA: Diagnosis present

## 2022-02-05 DIAGNOSIS — E1122 Type 2 diabetes mellitus with diabetic chronic kidney disease: Secondary | ICD-10-CM | POA: Diagnosis present

## 2022-02-05 DIAGNOSIS — I132 Hypertensive heart and chronic kidney disease with heart failure and with stage 5 chronic kidney disease, or end stage renal disease: Secondary | ICD-10-CM | POA: Diagnosis present

## 2022-02-05 DIAGNOSIS — K3189 Other diseases of stomach and duodenum: Secondary | ICD-10-CM | POA: Diagnosis not present

## 2022-02-05 DIAGNOSIS — I8500 Esophageal varices without bleeding: Secondary | ICD-10-CM | POA: Diagnosis not present

## 2022-02-05 DIAGNOSIS — Z87891 Personal history of nicotine dependence: Secondary | ICD-10-CM | POA: Diagnosis not present

## 2022-02-05 DIAGNOSIS — I8511 Secondary esophageal varices with bleeding: Secondary | ICD-10-CM | POA: Diagnosis present

## 2022-02-05 DIAGNOSIS — Z833 Family history of diabetes mellitus: Secondary | ICD-10-CM | POA: Diagnosis not present

## 2022-02-05 DIAGNOSIS — J9610 Chronic respiratory failure, unspecified whether with hypoxia or hypercapnia: Secondary | ICD-10-CM | POA: Diagnosis present

## 2022-02-05 DIAGNOSIS — R197 Diarrhea, unspecified: Secondary | ICD-10-CM | POA: Diagnosis not present

## 2022-02-05 DIAGNOSIS — N2581 Secondary hyperparathyroidism of renal origin: Secondary | ICD-10-CM | POA: Diagnosis present

## 2022-02-05 DIAGNOSIS — Z794 Long term (current) use of insulin: Secondary | ICD-10-CM | POA: Diagnosis not present

## 2022-02-05 DIAGNOSIS — I8510 Secondary esophageal varices without bleeding: Secondary | ICD-10-CM | POA: Diagnosis not present

## 2022-02-05 DIAGNOSIS — I5032 Chronic diastolic (congestive) heart failure: Secondary | ICD-10-CM | POA: Diagnosis present

## 2022-02-05 DIAGNOSIS — K7030 Alcoholic cirrhosis of liver without ascites: Secondary | ICD-10-CM | POA: Diagnosis not present

## 2022-02-05 DIAGNOSIS — K7682 Hepatic encephalopathy: Secondary | ICD-10-CM | POA: Diagnosis present

## 2022-02-05 DIAGNOSIS — Z79899 Other long term (current) drug therapy: Secondary | ICD-10-CM | POA: Diagnosis not present

## 2022-02-05 DIAGNOSIS — D62 Acute posthemorrhagic anemia: Secondary | ICD-10-CM | POA: Diagnosis present

## 2022-02-05 DIAGNOSIS — Y846 Urinary catheterization as the cause of abnormal reaction of the patient, or of later complication, without mention of misadventure at the time of the procedure: Secondary | ICD-10-CM | POA: Diagnosis present

## 2022-02-05 DIAGNOSIS — Z992 Dependence on renal dialysis: Secondary | ICD-10-CM | POA: Diagnosis not present

## 2022-02-05 DIAGNOSIS — D61818 Other pancytopenia: Secondary | ICD-10-CM | POA: Diagnosis present

## 2022-02-05 DIAGNOSIS — K7031 Alcoholic cirrhosis of liver with ascites: Secondary | ICD-10-CM | POA: Diagnosis present

## 2022-02-05 DIAGNOSIS — K766 Portal hypertension: Secondary | ICD-10-CM | POA: Diagnosis present

## 2022-02-05 DIAGNOSIS — G9341 Metabolic encephalopathy: Secondary | ICD-10-CM | POA: Diagnosis present

## 2022-02-05 DIAGNOSIS — M109 Gout, unspecified: Secondary | ICD-10-CM | POA: Diagnosis present

## 2022-02-05 DIAGNOSIS — K2980 Duodenitis without bleeding: Secondary | ICD-10-CM | POA: Diagnosis not present

## 2022-02-05 DIAGNOSIS — E785 Hyperlipidemia, unspecified: Secondary | ICD-10-CM | POA: Diagnosis present

## 2022-02-05 DIAGNOSIS — K7460 Unspecified cirrhosis of liver: Secondary | ICD-10-CM | POA: Diagnosis not present

## 2022-02-05 DIAGNOSIS — T83511A Infection and inflammatory reaction due to indwelling urethral catheter, initial encounter: Secondary | ICD-10-CM | POA: Diagnosis present

## 2022-02-05 DIAGNOSIS — I4891 Unspecified atrial fibrillation: Secondary | ICD-10-CM | POA: Diagnosis present

## 2022-02-05 DIAGNOSIS — D500 Iron deficiency anemia secondary to blood loss (chronic): Secondary | ICD-10-CM | POA: Diagnosis not present

## 2022-02-05 LAB — RENAL FUNCTION PANEL
Albumin: 2.5 g/dL — ABNORMAL LOW (ref 3.5–5.0)
Anion gap: 10 (ref 5–15)
BUN: 29 mg/dL — ABNORMAL HIGH (ref 8–23)
CO2: 26 mmol/L (ref 22–32)
Calcium: 8.2 mg/dL — ABNORMAL LOW (ref 8.9–10.3)
Chloride: 96 mmol/L — ABNORMAL LOW (ref 98–111)
Creatinine, Ser: 4.07 mg/dL — ABNORMAL HIGH (ref 0.61–1.24)
GFR, Estimated: 15 mL/min — ABNORMAL LOW (ref >60)
Glucose, Bld: 231 mg/dL — ABNORMAL HIGH (ref 70–99)
Phosphorus: 4.6 mg/dL (ref 2.5–4.6)
Potassium: 3.1 mmol/L — ABNORMAL LOW (ref 3.5–5.1)
Sodium: 132 mmol/L — ABNORMAL LOW (ref 135–145)

## 2022-02-05 LAB — URINE CULTURE: Culture: >=100000 — AB

## 2022-02-05 LAB — GLUCOSE, CAPILLARY
Glucose-Capillary: 239 mg/dL — ABNORMAL HIGH (ref 70–99)
Glucose-Capillary: 188 mg/dL — ABNORMAL HIGH (ref 70–99)
Glucose-Capillary: 266 mg/dL — ABNORMAL HIGH (ref 70–99)

## 2022-02-05 LAB — AMMONIA: Ammonia: 35 umol/L (ref 9–35)

## 2022-02-05 MED ORDER — VANCOMYCIN HCL IN DEXTROSE 1-5 GM/200ML-% IV SOLN
1000.0000 mg | Freq: Once | INTRAVENOUS | Status: DC
  Filled 2022-02-05: qty 200

## 2022-02-05 NOTE — Evaluation (Addendum)
Physical Therapy Evaluation Patient Details Name: Jeffrey [name] MRN: [id number] DOB: 04-28-1946 Today's Date: 02/05/2022  History of Present Illness  Jeffrey [name] is a 76 y.o. 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   Patient was brought to the ED with reports of weakness over the past few weeks and multiple falls recently.  At the time of my evaluation, patient is alone,  awake, but appears lethargic, slightly drowsy, answers a few questions appropriately, but also appears slightly confused.      I talked to patient's spouse on the phone, she confirms drowsiness, and intermittent confusion over the past few days.  She reports over the past 2 weeks, patient has been having at least 2-3 watery stools every day, so she did not give him his lactulose the past 2 weeks, but he was getting his rifaximin.  At baseline patient has 1-2 bowel movements a day and usually not watery, so and she was concerned for C. difficile infection.  Patient is also taking colchicine for gout, he has been on it for about 6 weeks now.       Patient's HD schedule is Monday Wednesday Friday, he did not get dialyzed today, instead he came to the ED.  Patient denies any pain to me.  Reports a chronic mild cough that is unchanged.  He does make some urine, he denies any urinary symptoms at this time.       Stopped drinking alcohol 2014 per spouse.   Clinical Impression  Patient demonstrates slow labored movement for sitting up at bedside, had 1 episode of falling backwards once seated and after a few minutes able to maintain sitting balance without problem, very unsteady on feet with steppage like gait due to c/o severe tightness and stiffness in calves.  Patient limited to a few steps before having to sit due to fatigue with near loss of  balance and tolerated sitting up in chair after therapy - nursing staff aware.  Patient will benefit from continued skilled physical therapy in hospital and recommended venue below to increase strength, balance, endurance for safe ADLs and gait.  Patient ambulated on 2 LPM with SpO2 at 95% and left on 2 LPM after therapy - NT aware.        Recommendations for follow up therapy are one component of a multi-disciplinary discharge planning process, led by the attending physician.  Recommendations may be updated based on patient status, additional functional criteria and insurance authorization.  Follow Up Recommendations Skilled nursing-short term rehab (<3 hours/day) Can patient physically be transported by private vehicle: Yes    Assistance Recommended at Discharge Set up Supervision/Assistance  Patient can return home with the following  A lot of help with bathing/dressing/bathroom;A lot of help with walking and/or transfers;Help with stairs or ramp for entrance;Assistance with cooking/housework    Equipment Recommendations None recommended by PT  Recommendations for Other Services       Functional Status Assessment Patient has had a recent decline in their functional status and demonstrates the ability to make significant improvements in function in a reasonable and predictable amount of time.     Precautions / Restrictions Precautions Precautions: Fall Restrictions Weight Bearing Restrictions: No      Mobility  Bed Mobility Overal bed mobility: Needs Assistance Bed Mobility: Supine to Sit     Supine to sit: Min assist,  Mod assist     General bed mobility comments: increased time, labored movement, had difficulty propping up on elbows to hands due to weakness    Transfers Overall transfer level: Needs assistance Equipment used: Rolling walker (2 wheels) Transfers: Sit to/from Stand, Bed to chair/wheelchair/BSC Sit to Stand: Min assist   Step pivot transfers: Min  assist, Mod assist       General transfer comment: labored movement, increased time    Ambulation/Gait Ambulation/Gait assistance: Mod assist Gait Distance (Feet): 10 Feet Assistive device: Rolling walker (2 wheels) Gait Pattern/deviations: Decreased step length - left, Decreased stance time - right, Decreased stride length, Decreased dorsiflexion - right, Decreased dorsiflexion - left, Steppage Gait velocity: slow     General Gait Details: slow labored unsteady cadence with decreased bilateral ankle dorsiflexion with c/o tightness behind lower legs, fatigues easily  Stairs            Wheelchair Mobility    Modified Rankin (Stroke Patients Only)       Balance Overall balance assessment: Needs assistance Sitting-balance support: Feet supported, No upper extremity supported Sitting balance-Leahy Scale: Fair Sitting balance - Comments: fair/good seated at EOB   Standing balance support: Reliant on assistive device for balance, During functional activity, Bilateral upper extremity supported Standing balance-Leahy Scale: Poor Standing balance comment: fair/poor using RW                             Pertinent Vitals/Pain Pain Assessment Pain Assessment: No/denies pain    Home Living Family/patient expects to be discharged to:: Private residence Living Arrangements: Spouse/significant other Available Help at Discharge: Family;Available PRN/intermittently Type of Home: House Home Access: Ramped entrance       Home Layout: Able to live on main level with bedroom/bathroom Home Equipment: Rolling Walker (2 wheels);Rollator (4 wheels);Cane - single point;Shower seat - built in;Grab bars - tub/shower;BSC/3in1      Prior Function Prior Level of Function : Independent/Modified Independent             Mobility Comments: household ambulator using RW, uses 2-3 LPM O2 constant at home ADLs Comments: assisted by family     Hand Dominance   Dominant Hand:  Right    Extremity/Trunk Assessment   Upper Extremity Assessment Upper Extremity Assessment: Generalized weakness    Lower Extremity Assessment Lower Extremity Assessment: Generalized weakness    Cervical / Trunk Assessment Cervical / Trunk Assessment: Kyphotic  Communication   Communication: No difficulties  Cognition Arousal/Alertness: Awake/alert Behavior During Therapy: WFL for tasks assessed/performed Overall Cognitive Status: Within Functional Limits for tasks assessed                                          General Comments      Exercises     Assessment/Plan    PT Assessment Patient needs continued PT services  PT Problem List Decreased strength;Decreased activity tolerance;Decreased balance;Decreased mobility       PT Treatment Interventions DME instruction;Gait training;Stair training;Functional mobility training;Therapeutic activities;Therapeutic exercise;Patient/family education;Balance training    PT Goals (Current goals can be found in the Care Plan section)  Acute Rehab PT Goals Patient Stated Goal: return home with family to assist PT Goal Formulation: With patient Time For Goal Achievement: 02/19/22 Potential to Achieve Goals: Good    Frequency Min 3X/week     Co-evaluation  AM-PAC PT "6 Clicks" Mobility  Outcome Measure Help needed turning from your back to your side while in a flat bed without using bedrails?: A Little Help needed moving from lying on your back to sitting on the side of a flat bed without using bedrails?: A Little Help needed moving to and from a bed to a chair (including a wheelchair)?: A Lot Help needed standing up from a chair using your arms (e.g., wheelchair or bedside chair)?: A Lot Help needed to walk in hospital room?: A Lot Help needed climbing 3-5 steps with a railing? : A Lot 6 Click Score: 14    End of Session Equipment Utilized During Treatment: Oxygen Activity Tolerance:  Patient tolerated treatment well;Patient limited by fatigue Patient left: in chair;with call bell/phone within reach Nurse Communication: Mobility status PT Visit Diagnosis: Unsteadiness on feet (R26.81);Other abnormalities of gait and mobility (R26.89);Muscle weakness (generalized) (M62.81)    Time: 9798-9211 PT Time Calculation (min) (ACUTE ONLY): 34 min   Charges:   PT Evaluation $PT Eval Moderate Complexity: 1 Mod PT Treatments $Therapeutic Activity: 23-37 mins        11:45 AM, 02/05/22 Jeffrey [name], MPT Physical Therapist with [facility] [phone] office [phone] mobile phone

## 2022-02-05 NOTE — Progress Notes (Signed)
PROGRESS NOTE  Jeffrey [name] IRS:[id number] DOB: 1945/11/13 DOA: 02/02/2022 PCP: Monico Blitz, MD  Brief History:  76 y.o. 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  Patient was brought to the ED with reports of weakness over the past few weeks and multiple falls recently.  At the time of my evaluation, patient is alone,  awake, but appears lethargic, slightly drowsy, answers a few questions appropriately, but also appears slightly confused.    I talked to patient's spouse on the phone, she confirms drowsiness, and intermittent confusion over the past few days.  She reports over the past 2 weeks, patient has been having at least 2-3 watery stools every day, so she did not give him his lactulose the past 2 weeks, but he was getting his rifaximin.  At baseline patient has 1-2 bowel movements a day and usually not watery, so and she was concerned for C. difficile infection.  Patient is also taking colchicine for gout, he has been on it for about 6 weeks now.     Patient's HD schedule is Monday Wednesday Friday, he did not get dialyzed today, instead he came to the ED.  Patient denies any pain to me.  Reports a chronic mild cough that is unchanged.  He does make some urine, he denies any urinary symptoms at this time.     Stopped drinking alcohol 2014 per spouse.   ED Course: Tmax 98.8.  Heart rate 69-75.  Respiratory rate 12-20.  Blood pressure systolic [id number].  O2 sats greater than 93% on 3 L. Ammonia level 65. Hemoglobin 7.1. UA with large leukocytes many bacteria greater than 50 WBCs. 2 view chest x-ray-gross cardiomegaly with pulmonary vascular prominence and layering bilateral pleural effusions similar to prior exam. Zosyn started for UTI.  Hospitalist to admit.     Assessment and  Plan: * Acute metabolic encephalopathy Multifactorial including UTI, hyperammonemia, ABLA -continue merrem -transfused 2 units PRBC -continue lactulose Mental status is now back to baseline  Acute hepatic encephalopathy [facility]) He presented lethargic with generalized weakness.  Also reports of multiple falls.  Ammonia level elevated at 65>>35 -Lactulose ordered and rifaximin. -head CT--no acute findings -Hold gabapentin, as needed Xanax, hydroxyzine, Percocet, mirtazapine, tizanidine, Effexor  UTI (urinary tract infection) due to urinary indwelling Foley catheter (HCC) UA >50 WBC with altered mental status Urine culture = ESBL Ecoli and E faecium Continue merrem Start vanc  Falls Multiple falls and generalized weakness.  Falls weakness, spouse reports patient did not hit his head. -CT head no acute findings -PT eval>>SNF  ESRD on dialysis [facility]) Schedule Monday Wednesday Friday.  Missed HD today.  Chest X-ray with stable layering bilateral effusions.  -nephrology consultation appreciated -last HD 02/05/22  Generalized weakness --suspect acutely exacerbated by anemia --Hg down to 6.4 --transfused 2 units PRBC --PT eval>>SNF  Iron deficiency anemia due to chronic blood loss Hemoglobin 6.4, baseline ~8. -transfuse 2 units PRBC with hemodialysis - hold subcutaneous heparin due to drop in Hg -05/22/21 EGD--portal HTN gastropathy, denuded esophageal mucosa -01/19/21 colonscopy--large non bleeding rectal varices; polyp prox desc colon, inflammatory polyp splenic flexure  Alcoholic cirrhosis ([place]) -- will need to follow up with GI outpatient for ongoing management   Essential hypertension, benign Stable. -Resume diltiazem, metoprolol, - Hold Lasix 80 mg daily, spironolactone  Diarrhea in adult  patient 2-3 episodes of watery stools over the past 2 weeks.  So has not taken Constulose in 2 weeks also.  History of C. difficile infection.  Afebrile.  Stable mild leukopenia, no  abdominal pain.  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   Objective: Vitals:   02/05/22 1400 02/05/22 1430 02/05/22 1457 02/05/22 1459  BP: (!) 155/66 (!) 154/69 (!) 160/64   Pulse: 80 82 86   Resp: '16 15 16   '$ Temp:   98.8 F (37.1 C)   TempSrc:   Oral   SpO2: 100% 100% 100%   Weight:    75 kg  Height:        Intake/Output Summary (Last 24 hours) at 02/05/2022 1653 Last data filed at 02/05/2022 1457 Gross per 24 hour  Intake 819.88 ml  Output 2250 ml  Net -1430.12 ml   Weight change:  Exam:  General:  Pt is alert, follows commands appropriately, not in acute distress HEENT: No icterus, No thrush, No neck mass, [place]/AT Cardiovascular: RRR, S1/S2, no rubs, no gallops Respiratory: fine bibasilar  crackles. No wheeze Abdomen: Soft/+BS, non tender, non distended, no guarding Extremities: No edema, No lymphangitis, No petechiae, No rashes, no synovitis   Data Reviewed: I have personally reviewed following labs and imaging studies Basic Metabolic Panel: Recent Labs  Lab 02/02/22 1327 02/03/22 0333 02/04/22 0301 02/05/22 0306  NA 133* 133* 133* 132*  K 4.2 4.2 3.0* 3.1*  CL 96* 99 97* 96*  CO2 '25 25 26 26  '$ GLUCOSE 158* 110* 166* 231*  BUN 34* 39* 20 29*  CREATININE 4.43* 4.96* 2.92* 4.07*  CALCIUM 9.2 9.0 8.0* 8.2*  MG 1.7  --   --   --   PHOS  --   --  3.0 4.6   Liver  Function Tests: Recent Labs  Lab 02/02/22 1327 02/04/22 0250 02/04/22 0301 02/05/22 0306  AST 46* 37  --   --   ALT 32 25  --   --   ALKPHOS 144* 122  --   --   BILITOT 1.2 1.4*  --   --   PROT 6.6 5.9*  --   --   ALBUMIN 2.8* 2.4* 2.4* 2.5*   No results for input(s): "LIPASE", "AMYLASE" in the last 168 hours. Recent Labs  Lab 02/02/22 1346 02/03/22 0806 02/04/22 0301 02/05/22 0306  AMMONIA 65* 64* 74* 35   Coagulation Profile: No results for input(s): "INR", "PROTIME" in the last 168 hours. CBC: Recent Labs  Lab 02/02/22 1327 02/03/22 0333 02/03/22 1929 02/04/22 0301  WBC 3.5* 2.9*  --  2.5*  NEUTROABS 3.0  --   --   --   HGB 7.1* 6.4* 8.7* 8.8*  HCT 22.9* 20.7* 27.0* 27.5*  MCV 106.5* 107.3*  --  98.2  PLT 111* 89*  --  85*   Cardiac Enzymes: No results for input(s): "CKTOTAL", "CKMB", "CKMBINDEX", "TROPONINI" in the last 168 hours.  BNP: Invalid input(s): "POCBNP" CBG: Recent Labs  Lab 02/04/22 1100 02/04/22 1620 02/04/22 2121 02/05/22 0722 02/05/22 1607  GLUCAP 192* 204* 197* 239* 188*   HbA1C: No results for input(s): "HGBA1C" in the last 72 hours. Urine analysis:    Component Value Date/Time   COLORURINE YELLOW 02/02/2022 1327   APPEARANCEUR CLOUDY (A) 02/02/2022 1327   LABSPEC 1.011 02/02/2022 1327   PHURINE 5.0 02/02/2022 1327   GLUCOSEU NEGATIVE 02/02/2022 1327   HGBUR MODERATE (A) 02/02/2022 1327   BILIRUBINUR NEGATIVE 02/02/2022 1327   KETONESUR NEGATIVE 02/02/2022 1327   PROTEINUR 100 (A) 02/02/2022 1327   NITRITE NEGATIVE 02/02/2022 1327   LEUKOCYTESUR LARGE (A) 02/02/2022 1327   Sepsis Labs: '@LABRCNTIP'$ (procalcitonin:4,lacticidven:4) ) Recent Results (from the past 240 hour(s))  Urine Culture     Status: Abnormal   Collection Time: 02/02/22  3:14 PM   Specimen: Urine, Catheterized  Result Value Ref Range Status   Specimen Description   Final    URINE, CATHETERIZED Performed at [facility], [address], [place], [place]  [address]    Special Requests   Final    NONE Performed at [facility], [address], [place], [place] [address]    Culture (A)  Final    >=100,000 COLONIES/mL ESCHERICHIA COLI Confirmed Extended Spectrum Beta-Lactamase Producer (ESBL).  In bloodstream infections from ESBL organisms, carbapenems are preferred over piperacillin/tazobactam. They are shown to have a lower risk of mortality. 60,000 COLONIES/mL ENTEROCOCCUS FAECIUM    Report Status 02/05/2022 FINAL  Final   Organism ID, Bacteria ESCHERICHIA COLI (A)  Final   Organism ID, Bacteria ENTEROCOCCUS FAECIUM (A)  Final      Susceptibility   Escherichia coli - MIC*    AMPICILLIN >=32 RESISTANT Resistant     CEFAZOLIN >=64 RESISTANT Resistant     CEFEPIME 4 INTERMEDIATE Intermediate     CEFTRIAXONE >=64 RESISTANT Resistant     CIPROFLOXACIN >=4 RESISTANT Resistant     GENTAMICIN <=1 SENSITIVE Sensitive     IMIPENEM 0.5 SENSITIVE Sensitive     NITROFURANTOIN <=16 SENSITIVE Sensitive     TRIMETH/SULFA >=320 RESISTANT Resistant     AMPICILLIN/SULBACTAM 8 SENSITIVE Sensitive     PIP/TAZO <=4 SENSITIVE Sensitive     * >=100,000 COLONIES/mL ESCHERICHIA COLI   Enterococcus faecium - MIC*    AMPICILLIN >=32 RESISTANT Resistant     NITROFURANTOIN 128 RESISTANT Resistant     VANCOMYCIN 1 SENSITIVE Sensitive     * 60,000 COLONIES/mL ENTEROCOCCUS FAECIUM     Scheduled Meds:  sodium chloride   Intravenous Once   sodium chloride   Intravenous Once   allopurinol  300 mg Oral Daily   Chlorhexidine Gluconate Cloth  6 each Topical Q0600   diltiazem  120 mg Oral Daily   famotidine  20 mg Oral Daily   insulin aspart  0-5 Units Subcutaneous QHS   insulin aspart  0-9 Units Subcutaneous TID WC   lactulose  10 g Oral BID   loratadine  10 mg Oral Daily   metoprolol succinate  25 mg Oral QHS   metoprolol succinate  25 mg Oral Once per day on Sun Mon Wed Fri   rifaximin  550 mg Oral BID   Continuous Infusions:  meropenem (MERREM) IV 500 mg  (02/05/22 1614)   vancomycin      Procedures/Studies: CT HEAD WO CONTRAST (5MM)  Result Date: 02/03/2022 CLINICAL DATA:  Initial evaluation for encephalopathy, multiple falls. EXAM: CT HEAD WITHOUT CONTRAST TECHNIQUE: Contiguous axial images were obtained  from the base of the skull through the vertex without intravenous contrast. RADIATION DOSE REDUCTION: This exam was performed according to the departmental dose-optimization program which includes automated exposure control, adjustment of the mA and/or kV according to patient size and/or use of iterative reconstruction technique. COMPARISON:  Prior CT from 04/01/2020. FINDINGS: Brain: Age-related cerebral atrophy with chronic small vessel ischemic disease. Small remote left cerebellar infarct noted, stable. No acute intracranial hemorrhage. No acute large vessel territory infarct. No mass lesion, midline shift or mass effect. No hydrocephalus or extra-axial fluid collection. Vascular: No abnormal hyperdense vessel. Scattered vascular calcifications noted within the carotid siphons. Skull: Scalp soft tissues demonstrate no acute finding. Calvarium intact. Sinuses/Orbits: Globes orbital soft tissues within normal limits. Paranasal sinuses and mastoid air cells are clear. Other: None. IMPRESSION: 1. No acute intracranial abnormality. 2. Age-related cerebral atrophy with chronic small vessel ischemic disease, with a small remote left cerebellar infarct. Electronically Signed   By: Jeannine Boga M.D.   On: 02/03/2022 01:49   DG Chest 2 View  Result Date: 02/02/2022 CLINICAL DATA:  Dialysis, missed dialysis, weakness, multiple falls EXAM: CHEST - 2 VIEW COMPARISON:  08/10/2021 FINDINGS: Gross cardiomegaly. Layering bilateral pleural effusions, similar to prior examination. Pulmonary vascular prominence. Osseous structures unremarkable. IMPRESSION: Gross cardiomegaly with pulmonary vascular prominence and layering bilateral pleural effusions, similar to  prior examination. Electronically Signed   By: Delanna Ahmadi M.D.   On: 02/02/2022 15:44   US Paracentesis  Result Date: 01/15/2022 INDICATION: Recurrent ascites, cirrhosis, last para 11/20/21 EXAM: ULTRASOUND GUIDED  PARACENTESIS MEDICATIONS: None. COMPLICATIONS: None immediate. PROCEDURE: Informed written consent was obtained from the patient after a discussion of the risks, benefits and alternatives to treatment. A timeout was performed prior to the initiation of the procedure. Initial ultrasound scanning demonstrates a moderate amount of ascites within the left lower abdominal quadrant. The left lower abdomen was prepped and draped in the usual sterile fashion. 1% lidocaine was used for local anesthesia. Following this, a 19 gauge, 7-cm, Yueh catheter was introduced. An ultrasound image was saved for documentation purposes. The paracentesis was performed. The catheter was removed and a dressing was applied. The patient tolerated the procedure well without immediate post procedural complication. FINDINGS: A total of approximately 800cc of turbid fluid was removed. Samples were sent to the laboratory as requested by the clinical team. IMPRESSION: Successful ultrasound-guided paracentesis yielding 0.8 liters of peritoneal fluid. Electronically Signed   By: Lavonia Dana M.D.   On: 01/15/2022 13:07    Orson Eva, DO  [facility]  If 7PM-7AM, please contact night-coverage [email or web address] Password [email or web address] 02/05/2022, 4:53 PM   LOS: 0 days

## 2022-02-05 NOTE — Progress Notes (Signed)
Received patient in bed to unit.  Alert and oriented.  Informed consent signed and in chart.   Treatment initiated: 1128 Treatment completed: 1458  Patient tolerated well.  Transported back to the room  Alert, without acute distress.  Hand-off given to patient's nurse.   Access used: AVF Access issues: none.  Total UF removed: 2 L Medication(s) given: vancomycin 1 g. Post HD VS: 160/64 P 84 R 18. Post HD weight: 75kg   [facility]

## 2022-02-05 NOTE — Plan of Care (Signed)
  Problem: Acute Rehab PT Goals(only PT should resolve) Goal: Pt Will Go Supine/Side To Sit Outcome: Progressing Flowsheets (Taken 02/05/2022 1148) Pt will go Supine/Side to Sit:  with min guard assist  with minimal assist Goal: Patient Will Transfer Sit To/From Stand Outcome: Progressing Flowsheets (Taken 02/05/2022 1148) Patient will transfer sit to/from stand:  with min guard assist  with minimal assist Goal: Pt Will Transfer Bed To Chair/Chair To Bed Outcome: Progressing Flowsheets (Taken 02/05/2022 1148) Pt will Transfer Bed to Chair/Chair to Bed:  min guard assist  with min assist Goal: Pt Will Ambulate Outcome: Progressing Flowsheets (Taken 02/05/2022 1148) Pt will Ambulate:  25 feet  with minimal assist  with rolling walker   11:48 AM, 02/05/22 Lonell Grandchild, MPT Physical Therapist with [facility] [phone] office [phone] mobile phone

## 2022-02-05 NOTE — NC FL2 (Signed)
[place] LEVEL OF CARE SCREENING TOOL     IDENTIFICATION  Patient Name: Jeffrey [name] Birthdate: Jan 16, 1946 Sex: male Admission Date (Current Location): 02/02/2022  [facility] and [place] Number:  [employer] and Address:         Provider Number: [id number]  Attending Physician Name and Address:  Orson Eva, MD  Relative Name and Phone Number:  Hesch,[name] (Spouse)   [phone]    Current Level of Care: Hospital Recommended Level of Care: [place] Prior Approval Number:    Date Approved/Denied:   PASRR Number: [id number]  Discharge Plan: SNF    Current Diagnoses: Patient Active Problem List   Diagnosis Date Noted   Symptomatic anemia 07/28/2021   Hypokalemia 07/28/2021   Hypomagnesemia 07/28/2021   Rectal varices    Hyponatremia 01/17/2021   Thrombocytopenia ([place]) 01/17/2021   B12 deficiency 11/21/2020   Iron deficiency anemia due to chronic blood loss 06/25/2020   Gastroesophageal reflux disease 06/06/2020   Bacteremia due to Gram-positive bacteria 04/03/2020   Acute on chronic respiratory failure with hypoxia and hypercapnia ([place]) 04/03/2020   Lobar pneumonia ([place]) 04/03/2020   Volume overload 04/02/2020   Anxiety with depression 04/02/2020   Asymptomatic bacteriuria 04/02/2020   Pain of upper abdomen 02/22/2020   Urine leukocytes increased 02/22/2020   Falls    Minor head injury    Physical deconditioning    Acute hyponatremia 02/19/2020   Fever and chills 02/09/2020   Advanced care planning/counseling discussion    DNR (do not resuscitate)    Alcoholic cirrhosis of liver ([place]) 02/08/2020   Back pain 02/08/2020   Status post thoracentesis    Acute metabolic encephalopathy 16/38/4536   Bilateral pleural effusion 12/31/2019   Hypoglycemia 12/31/2019   Hyperammonemia ([place]) 12/31/2019   Class 2 obesity 12/31/2019   Hypoalbuminemia 12/31/2019   Nosebleed    Goals of care, counseling/discussion    Palliative care  by specialist    Acute hepatic encephalopathy ([place]) 12/13/2019   Encephalopathy acute    Hyperkalemia    Chronic hyponatremia    Anemia 11/16/2019   Acute blood loss anemia 09/20/2019   GI bleed    Acute on chronic anemia 09/18/2019   Generalized weakness 09/18/2019   Controlled type 2 diabetes mellitus with chronic kidney disease on chronic dialysis, with long-term current use of insulin ([place]) 09/18/2019   ESRD on dialysis ([place]) 09/18/2019   Clostridioides difficile infection 08/31/2019   Edema 46/80/3212   Alcoholic cirrhosis ([place]) 24/82/5003   Essential hypertension, benign 10/20/2017   Morbid obesity ([place]) 10/20/2017   Mixed hyperlipidemia 10/20/2017   Ascites    SBP (spontaneous bacterial peritonitis) ([place]) 01/12/2016   Shigella dysenteriae 01/11/2016   Diarrhea in adult patient 01/10/2016   Liver cirrhosis ([place]) 70/48/8891   Alcoholic cirrhosis of liver with ascites ([place]) 03/09/2012   Heme positive stool 03/09/2012   Anemia due to chronic blood loss 03/09/2012   Pancytopenia ([place]) 03/09/2012   FH: colon cancer 03/09/2012   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 1227m fluid restriction)  AMBULATORY STATUS COMMUNICATION OF NEEDS Skin   Limited Assist Verbally Skin abrasions (skin tear, left arm, lower, posterior; skin tear lower posterior proximial right)  Personal Care Assistance Level of Assistance  Bathing, Feeding, Dressing Bathing Assistance: Limited assistance Feeding assistance: Independent Dressing Assistance: Limited assistance     Functional Limitations Info  Sight, Hearing, Speech Sight Info: Adequate Hearing Info: Adequate Speech Info: Adequate    SPECIAL  CARE FACTORS FREQUENCY  PT (By licensed PT)     PT Frequency: 3x/week              Contractures Contractures Info: Not present    Additional Factors Info  Code Status, Allergies, Psychotropic Code Status Info: Full code Allergies Info: tape Psychotropic Info: xanax, remeron, effexor         Current Medications (02/05/2022):  This is the current hospital active medication list Current Facility-Administered Medications  Medication Dose Route Frequency Provider Last Rate Last Admin   0.9 %  sodium chloride infusion (Manually program via Guardrails IV Fluids)   Intravenous Once Zierle-Ghosh, Asia B, DO       0.9 %  sodium chloride infusion (Manually program via Guardrails IV Fluids)   Intravenous Once Johnson, Clanford L, MD       allopurinol (ZYLOPRIM) tablet 300 mg  300 mg Oral Daily Emokpae, Ejiroghene E, MD   300 mg at 02/05/22 9735   Chlorhexidine Gluconate Cloth 2 % PADS 6 each  6 each Topical Q0600 Donato Heinz, MD   6 each at 02/05/22 0557   diltiazem (CARDIZEM CD) 24 hr capsule 120 mg  120 mg Oral Daily Madueme, Elvira C, RPH   120 mg at 02/05/22 0812   famotidine (PEPCID) tablet 20 mg  20 mg Oral Daily Johnson, Clanford L, MD   20 mg at 02/05/22 0812   insulin aspart (novoLOG) injection 0-5 Units  0-5 Units Subcutaneous QHS Emokpae, Ejiroghene E, MD       insulin aspart (novoLOG) injection 0-9 Units  0-9 Units Subcutaneous TID WC Emokpae, Ejiroghene E, MD   3 Units at 02/05/22 0847   lactulose (CHRONULAC) 10 GM/15ML solution 10 g  10 g Oral BID Johnson, Clanford L, MD   10 g at 02/05/22 0812   lidocaine (PF) (XYLOCAINE) 1 % injection 5 mL  5 mL Intradermal PRN Donato Heinz, MD       lidocaine-prilocaine (EMLA) cream 1 Application  1 Application Topical PRN Donato Heinz, MD       loratadine (CLARITIN) tablet 10 mg  10 mg Oral Daily Emokpae, Ejiroghene E, MD   10 mg at 02/05/22 1030   meropenem (MERREM) 500 mg in sodium chloride 0.9 % 100 mL IVPB  500 mg  Intravenous Q24H Tat, Shanon Brow, MD 200 mL/hr at 02/05/22 1614 500 mg at 02/05/22 1614   metoprolol succinate (TOPROL-XL) 24 hr tablet 25 mg  25 mg Oral QHS Emokpae, Ejiroghene E, MD   25 mg at 02/04/22 2123   metoprolol succinate (TOPROL-XL) 24 hr tablet 25 mg  25 mg Oral Once per day on Sun Mon Wed Fri Wynetta Emery, Clanford L, MD   25 mg at 02/04/22 0818   pentafluoroprop-tetrafluoroeth (GEBAUERS) aerosol 1 Application  1 Application Topical PRN Donato Heinz, MD       rifaximin Doreene Nest) tablet 550 mg  550 mg Oral BID Emokpae, Ejiroghene E, MD   550 mg at 02/05/22 3299   vancomycin (VANCOCIN) IVPB 1000 mg/200 mL premix  1,000 mg Intravenous Once Tat, Shanon Brow, MD         Discharge Medications: Please see discharge summary for a list of discharge medications.  Relevant Imaging Results:  Relevant Lab Results:  Additional Information SSN [id number]. Patient is on HD, MWF at [facility] in [place]. Per wife, patient has been vaccinated and boostered for COVID.  Sirius Woodford, Clydene Pugh, LCSW

## 2022-02-05 NOTE — Progress Notes (Signed)
Pt is alert and oriented x4. Pt denied any pain throughout shift. Pt has rested well throughout the night.

## 2022-02-05 NOTE — Consult Note (Addendum)
Gastroenterology Consult   Referring Provider: No ref. provider found Primary Care Physician:  Monico Blitz, MD Primary Gastroenterologist:  Garfield Cornea, MD   Patient ID: Jeffrey [name]; [id number]; April 17, 1946   Admit date: 02/02/2022  LOS: 0 days   Date of Consultation: 02/05/2022  Reason for Consultation:  acute blood loss anemia, portal HTN gastropathy    History of Present Illness   Jeffrey [name] is a 76 y.o. 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  Historically has had issues with volume overload, diuretics managed by nephrology. Patient on hemodialysis M,W,F. He has had recurrent SBP, on Bactrim weekly. Due to risk of SBP he is not on non-selective beta blocker for esophageal varices. He follows with hematology for IDA which is multifactorial in setting of ESRD and chronic GI bleeding. He is receiving IV iron monthly. Also on B12 injections monthly. Hematology note reports patients last blood transfusion in 09/2021. Last abdominal paracentesis 01/15/22, 0.8 liters yielded with no evidence of SBP.  Patient states he was doing okay prior to last weekend. He came home from dialysis on Friday, and had generalized weakness. Having difficulty getting into house or into bed. Required several people to get him up from a step at his bed. He did have episode of fecal incontinence on Friday when he was trying to get into the house. States he could not get to the bathroom in time. States the stool was black once several days ago but had been normal color since. He is on oral iron chronically, stools are often dark. No brbpr. No N/V. No abdominal pain. He reports typically skips lactulose because he can't take it and be stuck on dialysis machine. He states he  takes Xifaxan BID as prescribed. Per records, patient's spouse also noted he had some confusion over the weekend. Today he is quite alert, and oriented. Also spouse concerned he may have Cdiff again, he has had a few episodes of watery stools over the past two weeks. Recently prescribed colchicine.  Day of admission: sodium 133, BUN 34, creatinine 4.43, albumin 2.8, AST 46, ALT 32, alkaline phosphatase 144, total bilirubin 1.2.  White blood cell count 3500, hemoglobin 7.1, hematocrit 22.9, MCV 106.5, platelets 111,000.  1 month prior his hemoglobin was 8.1.  Ammonia level 65-->74-->35.  Hemoglobin dropped from 6.4 days ago.  Received 2 units of packed red blood cells.  Hemoglobin up to 8.8 yesterday.    Urine culture positive for E. Coli.  Patient currently on IV vancomycin and meropenem.     Recent endoscopic evaluation:  EGD December 2022: Stigmata of prior esophageal band ligation with no significant varices seen.  Slightly denuded distal esophageal mucosa.  Portal gastropathy.  Polypoid gastric and duodenal mucosa with overlying erosions.  Marked mucosal friability.  EGD 01/16/2021: Portal hypertensive gastropathy, grade 2 esophageal varices with bleeding stigmata s/p banding x4, slight nodularity and congestion involving bulb and second portion of duodenum, nonspecific.  Recommended repeat EGD in 4 weeks.   Colonoscopy 01/16/2021: Portal colopathy, large rectal varices, polypoid mass at splenic flexure biopsy, two 1 cm left colon polyps not removed, left-sided diverticulosis.  Reported patient had bleeding from splenic flexure lesion.  Pathology with acutely inflamed colonic mucosa with localized ulceration and prominent reactive changes.  Colonoscopy 01/19/2021 (Dr. Candis Schatz): 15 mm polyp in proximal  descending colon removed piecemeal using hot snare, 6 mm polyp in ascending colon, benign polypoid lesion at splenic flexure which was likely source of bleeding given adherent clot and reported  friability and excessive bleeding following previous biopsy, 12 mm polyp in the sigmoid colon, diverticulosis in the sigmoid colon and descending colon, rectal varices.  Other polyps were not removed because of suspected lower risk of malignant transformation and relatively high risk of bleeding. 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 Guidelines recommend surveillance colonoscopy in 3 years but due to multiple comorbidities he may not be a good candidate  Prior to Admission medications   Medication Sig Start Date End Date Taking? Authorizing Provider  acetaminophen (TYLENOL) 650 MG CR tablet Take 650 mg by mouth every 8 (eight) hours as needed for pain.   Yes [provider]  Alcohol Swabs (ALCOHOL PREP) 70 % PADS Apply 1 each topically 3 (three) times daily. 01/31/20  Yes [provider]  allopurinol (ZYLOPRIM) 300 MG tablet Take 300 mg by mouth daily. 12/11/21  Yes [provider]  ALPRAZolam Duanne Moron) 0.5 MG tablet Take 0.5 mg by mouth See admin instructions. Take one tablet (0.5 mg) by mouth on Monday, Wednesday, Friday before dialysis, may also take one tablet (0.5 mg) daily as needed for anxiety. 09/21/20  Yes [provider]  Ascorbic Acid (VITAMIN C) 1000 MG tablet Take 1,000 mg by mouth in the morning.   Yes [provider]  calcium carbonate (TUMS - DOSED IN MG ELEMENTAL CALCIUM) 500 MG chewable tablet Chew 1 tablet by mouth 3 (three) times daily with meals.   Yes [provider]  Calcium Carbonate-Simethicone (ALKA-SELTZER HEARTBURN + GAS) 750-80 MG CHEW Chew 1-2 tablets by mouth 2 (two) times daily as needed (acid reflux).   Yes [provider]  cetirizine (ZYRTEC) 10 MG tablet Take 10 mg by mouth daily. 06/24/21  Yes [provider]  cholecalciferol (VITAMIN D3) 25 MCG (1000 UNIT) tablet Take 1,000 Units by mouth daily.   Yes [provider]  colchicine 0.6 MG  tablet Take 0.6 mg by mouth 2 (two) times daily. 12/11/21  Yes [provider]  CONSTULOSE 10 GM/15ML solution Take by mouth. 11/21/21  Yes [provider]  diltiazem (DILACOR XR) 120 MG 24 hr capsule Take 120 mg by mouth daily. 11/19/21  Yes [provider]  famotidine (PEPCID) 20 MG tablet Take 1 tablet (20 mg total) by mouth 2 (two) times daily. 07/30/21 07/30/22 Yes Barton Dubois, MD  furosemide (LASIX) 40 MG tablet Take 80 mg by mouth daily. 12/29/19  Yes [provider]  gabapentin (NEURONTIN) 100 MG capsule TAKE 1 CAPSULE BY MOUTH AT BEDTIME Patient taking differently: Take 100 mg by mouth 3 (three) times daily. 05/28/20  Yes Waynetta Sandy, MD  Garlic 0086 MG CAPS Take 1,000 mg by mouth daily.   Yes [provider]  HUMALOG KWIKPEN 100 UNIT/ML KwikPen 10-50 Units 3 (three) times daily. Sliding scale 04/23/21  Yes [provider]  hydrOXYzine (ATARAX/VISTARIL) 25 MG tablet Take 25 mg by mouth 3 (three) times daily. For itching 11/23/20  Yes [provider]  iron polysaccharides (NIFEREX) 150 MG capsule Take 150 mg by mouth daily with supper.   Yes [provider]  metoprolol succinate (TOPROL-XL) 25 MG 24 hr tablet Take 1 tablet (25 mg total) by mouth See admin instructions. Take one tablet (25 mg) by mouth at bedtime every night and take one tablet (  25 mg) in morning on  Sun, Tues, Thurs and Sat (non-dialysis days) 04/15/21  Yes Johnson, Clanford L, MD  mirtazapine (REMERON) 30 MG tablet Take 1 tablet (30 mg total) by mouth at bedtime. Patient taking differently: Take 30 mg by mouth daily with supper. 02/10/20  Yes Roxan Hockey, MD  Multiple Vitamin (MULTIVITAMIN WITH MINERALS) TABS tablet Take 1 tablet by mouth in the morning.    Yes [provider]  omega-3 acid ethyl esters (LOVAZA) 1 g capsule Take 2 g by mouth 2 (two) times daily.  04/07/19  Yes [provider]  ondansetron (ZOFRAN) 4 MG  tablet Take 4 mg by mouth every 8 (eight) hours as needed for nausea or vomiting.   Yes [provider]  oxyCODONE-acetaminophen (PERCOCET/ROXICET) 5-325 MG tablet Take 1 tablet by mouth every 4 (four) hours as needed for moderate pain. 02/27/20  Yes [provider]  Ped Vitamins ACD Fl-Iron (TRI-VIT/FLUORIDE/IRON PO) Take 1 tablet by mouth daily. 08/01/19  Yes [provider]  pravastatin (PRAVACHOL) 20 MG tablet Take 20 mg by mouth in the morning.    Yes [provider]  Probiotic Product (PROBIOTIC DAILY PO) Take 1 tablet by mouth in the morning.    Yes [provider]  spironolactone (ALDACTONE) 50 MG tablet Take 50 mg by mouth in the morning.    Yes [provider]  sulfamethoxazole-trimethoprim (BACTRIM DS) 800-160 MG tablet Take 1 tablet by mouth once a week. Take every Friday after dialysis. 09/23/21  Yes Erenest Rasher, PA-C  thiamine (VITAMIN B-1) 100 MG tablet Take 100 mg by mouth daily. 08/27/20  Yes [provider]  tiZANidine (ZANAFLEX) 2 MG tablet Take 2 mg by mouth 2 (two) times daily. 08/20/21  Yes [provider]  TRESIBA FLEXTOUCH 200 UNIT/ML FlexTouch Pen Inject 120 Units into the skin at bedtime. 04/22/21  Yes [provider]  triamcinolone cream (KENALOG) 0.1 % Apply 1 application topically 2 (two) times daily as needed (dry skin Itching). 12/30/17  Yes [provider]  venlafaxine (EFFEXOR) 75 MG tablet Take 75 mg by mouth 3 (three) times daily with meals.    Yes [provider]  XIFAXAN 550 MG TABS tablet TAKE 1 TABLET BY MOUTH TWICE DAILY Patient taking differently: Take 550 mg by mouth 2 (two) times daily. 02/13/21  Yes Mahala Menghini, PA-C  BD PEN NEEDLE MICRO U/F 32G X 6 MM MISC SMARTSIG:SUB-Q Daily PRN 04/23/21   [provider]  Continuous Blood Gluc Sensor (FREESTYLE LIBRE 2 SENSOR) MISC Apply topically. 10/15/21   [provider]  GLUCAGEN HYPOKIT 1 MG SOLR  injection Inject 1 mg into the muscle once as needed for low blood sugar. 11/28/20   [provider]  Milk Thistle 250 MG CAPS Take 250 mg by mouth in the morning, at noon, and at bedtime.    [provider]    Current Facility-Administered Medications  Medication Dose Route Frequency Provider Last Rate Last Admin   0.9 %  sodium chloride infusion (Manually program via Guardrails IV Fluids)   Intravenous Once Zierle-Ghosh, Asia B, DO       0.9 %  sodium chloride infusion (Manually program via Guardrails IV Fluids)   Intravenous Once Johnson, Clanford L, MD       allopurinol (ZYLOPRIM) tablet 300 mg  300 mg Oral Daily Emokpae, Ejiroghene E, MD   300 mg at 02/05/22 0812   Chlorhexidine Gluconate Cloth 2 % PADS 6 each  6 each  Topical Q0600 Donato Heinz, MD   6 each at 02/05/22 0557   diltiazem (CARDIZEM CD) 24 hr capsule 120 mg  120 mg Oral Daily Madueme, Elvira C, RPH   120 mg at 02/05/22 5956   famotidine (PEPCID) tablet 20 mg  20 mg Oral Daily Johnson, Clanford L, MD   20 mg at 02/05/22 3875   insulin aspart (novoLOG) injection 0-5 Units  0-5 Units Subcutaneous QHS Emokpae, Ejiroghene E, MD       insulin aspart (novoLOG) injection 0-9 Units  0-9 Units Subcutaneous TID WC Emokpae, Ejiroghene E, MD   3 Units at 02/04/22 1709   lactulose (CHRONULAC) 10 GM/15ML solution 10 g  10 g Oral BID Johnson, Clanford L, MD   10 g at 02/05/22 0812   lidocaine (PF) (XYLOCAINE) 1 % injection 5 mL  5 mL Intradermal PRN Donato Heinz, MD       lidocaine-prilocaine (EMLA) cream 1 Application  1 Application Topical PRN Donato Heinz, MD       loratadine (CLARITIN) tablet 10 mg  10 mg Oral Daily Emokpae, Ejiroghene E, MD   10 mg at 02/04/22 0833   meropenem (MERREM) 500 mg in sodium chloride 0.9 % 100 mL IVPB  500 mg Intravenous Q24H Orson Eva, MD   Stopped at 02/04/22 1738   metoprolol succinate (TOPROL-XL) 24 hr tablet 25 mg  25 mg Oral QHS Emokpae, Ejiroghene E, MD   25 mg at  02/04/22 2123   metoprolol succinate (TOPROL-XL) 24 hr tablet 25 mg  25 mg Oral Once per day on Sun Mon Wed Fri Irwin Brakeman L, MD   25 mg at 02/04/22 0818   pentafluoroprop-tetrafluoroeth (GEBAUERS) aerosol 1 Application  1 Application Topical PRN Donato Heinz, MD       rifaximin Doreene Nest) tablet 550 mg  550 mg Oral BID Emokpae, Ejiroghene E, MD   550 mg at 02/05/22 6433    Allergies as of 02/02/2022 - Review Complete 02/02/2022  Allergen Reaction Noted   Tape Itching, Rash, and Other (See Comments) 09/18/2019    Past Medical History:  Diagnosis Date   A-fib [facility])    when initially stating dialysis january 2021 at [facility] per spouse; never heard anything else about it   Alcoholic cirrhosis ([place])    patient reports completing hep a and b vaccines in 2006   Anemia    has had 3 units of prbcs 2013   Anxiety    Arthritis    Asymptomatic gallstones    Ultrasound in 2006   B12 deficiency 11/21/2020   C. difficile colitis    Cholelithiasis    Chronic kidney disease    Dialysis M/W/F/Sa   Depression    Diabetes ([place])    Duodenal ulcer with hemorrhage    per patient in 2006 or 2007, records have been requested   Dyspnea    low oxygen level -  has O2 2 L all day   Esophageal varices ([place])    see P[facility]   GERD (gastroesophageal reflux disease)    Heart burn    History of alcohol abuse    quit 05/2013   HOH (hard of hearing)    Hypertension    Liver cirrhosis ([place])    ETOH   SBP (spontaneous bacterial peritonitis) ([place])    2020, November 2021.    Splenomegaly    Ultrasound in 2006    Past Surgical History:  Procedure Laterality Date   AGILE CAPSULE N/A 09/20/2019   Procedure: AGILE CAPSULE;  Surgeon: Daneil Dolin, MD;  [facility];  Service: Endoscopy;  Laterality: N/A;   APPENDECTOMY     AV FISTULA PLACEMENT Left 08/17/2019   Procedure: ARTERIOVENOUS (AV) FISTULA CREATION VERSES ARTERIOVENOUS GRAFT;  Surgeon: [name], MD;  [facility] OR;  Service: Vascular;  Laterality: Left;   BASCILIC VEIN TRANSPOSITION Left 09/28/2019   Procedure: LEFT SECOND STAGE [place];  Surgeon: [name], MD;  Location: [place];  Service: Vascular;  Laterality: Left;   BIOPSY  02/03/2018   Procedure: BIOPSY;  Surgeon: Daneil Dolin, MD;  [facility];  Service: Endoscopy;;  bx of gastric polyps   BIOPSY  09/19/2019   Procedure: BIOPSY;  Surgeon: Danie Binder, MD;  [facility];  Service: Endoscopy;;   BIOPSY  01/16/2021   Procedure: BIOPSY;  Surgeon: Daneil Dolin, MD;  [facility];  Service: Endoscopy;;   CATARACT EXTRACTION Right    COLONOSCOPY  03/10/2005   Rectal polyp as described above, removed with snare. Left sided  diverticula. The remainder of the colonic mucosa appeared normal. Inflammed polyp on path.   COLONOSCOPY  04/1999   Dr. Thea Silversmith polyps removed,  Path showed hyperplastic   COLONOSCOPY  10/2015   Dr. Britta Mccreedy: diverticulosis, single sessile tubular adenoma 3-69m in size removed from descending colon.    COLONOSCOPY WITH ESOPHAGOGASTRODUODENOSCOPY (EGD)  03/31/2012   RVEL:FYBOFBPAVMs. Colonic diverticulosis. tubular adenoma colon   COLONOSCOPY WITH ESOPHAGOGASTRODUODENOSCOPY (EGD)  06/2019   FORSYTH: small esophageal varices without high risk stigmata, single large AVM on lesser curvature in gastric body s/p APC ablation, gastric antral and duodenal bulb polyposis. No significant source to explain transfusion dependent anemia. Colonoscopy with portal colopathy and diffuse edema, changes of Cdiff colitis on colonoscopy   COLONOSCOPY WITH PROPOFOL N/A 01/19/2021   Surgeon: CDaryel November MD; 15 mm polyp in descending colon removed piecemeal, 6 mm polyp in ascending colon and 12 mm polyp in sigmoid colon not removed due to high bleeding risk, benign polypoid lesion at splenic flexure, diverticulosis in sigmoid and descending colon, rectal varices.    COLONOSCOPY WITH PROPOFOL N/A 01/16/2021   Surgeon: RDaneil Dolin MD;  Portal colopathy, large rectal varices, polypoid mass at splenic flexure biopsy, two 1 cm left colon polyps not removed, left-sided diverticulosis.  Reported patient had bleeding from splenic flexure lesion.  Pathology with acutely inflamed colonic mucosa with localized ulceration and prominent reactive changes.   ESOPHAGEAL BANDING N/A 02/03/2018   Procedure: ESOPHAGEAL BANDING;  Surgeon: RDaneil Dolin MD;  [facility];  Service: Endoscopy;  Laterality: N/A;   ESOPHAGEAL BANDING  01/16/2021   Procedure: ESOPHAGEAL BANDING;  Surgeon: RDaneil Dolin MD;  [facility];  Service: Endoscopy;;   ESOPHAGOGASTRODUODENOSCOPY  01/15/2005   Three columns grade 1 to 2 esophageal varices, otherwise normal esophageal mucosa.  Esophagus was not manipulated otherwise./Nodularity of the antrum with overlying erosions, nonspecific finding. Path showed rare H.pylori   ESOPHAGOGASTRODUODENOSCOPY  09/2008   Dr. RGaylene Brookscolumns of grade 2-3 esoph varices, only one column was prominent. Portal gastropathy, multiple gastrc polyps at antrum, two were 2cm with black eschar, bulbar polyps, bulbar erosions   ESOPHAGOGASTRODUODENOSCOPY  11/2004   Dr. FBrantley Stage3 esoph varices   ESOPHAGOGASTRODUODENOSCOPY  03/31/2012   RMR: 4 columns(3-GR 2, 1-GR1) non-bleeding esophageal varices, portal gastropathy, small HH, early GAVE, multiple gastric polyps    ESOPHAGOGASTRODUODENOSCOPY (EGD) WITH PROPOFOL N/A 02/03/2018   Dr.  Rourk: Esophageal varices, 3 columns grade 1-2.  Portal hypertensive gastropathy.  Multiple gastric polyps, biopsy consistent with hyperplastic.   ESOPHAGOGASTRODUODENOSCOPY (EGD) WITH PROPOFOL N/A 09/19/2019   Fields: grade I and II esophageal varcies, mild portal hypertensive gastropathy, moderate gastritis but no H. pylori, single hyperplastic gastric polyp removed, obvious source for  melena/transfusion dependent anemia not identified, may be due to friable gastric and duodenal mucosa in the setting of portal hypertension   ESOPHAGOGASTRODUODENOSCOPY (EGD) WITH PROPOFOL N/A 01/16/2021   Surgeon: Daneil Dolin, MD; Portal hypertensive gastropathy, grade 2 esophageal varices with bleeding stigmata s/p banding x4, slight nodularity and congestion involving bulb and second portion of duodenum, nonspecific.  Recommended repeat EGD in 4 weeks.   ESOPHAGOGASTRODUODENOSCOPY (EGD) WITH PROPOFOL N/A 05/22/2021   stigmata of prior esophageal banding and no significant varices seen. Portal gastropathy. Polypoid gastric and duodenal mucosa with overlying erosions, marked mucosal friability.   GASTRIC VARICES BANDING  03/31/2012   Procedure: GASTRIC VARICES BANDING;  Surgeon: Daneil Dolin, MD;  [facility];  Service: Endoscopy;  Laterality: N/A;   GIVENS CAPSULE STUDY N/A 09/21/2019   Poor study with a lot of debris obstructing much of the view of the first approximate 3 hours out of 6 hours.  No obvious source of bleeding identified.  Sequela of bleeding and old blood seen in the form of"whisps" of blood, blood flecks mostly toward the end of the study.   HEMOSTASIS CLIP PLACEMENT  01/19/2021   Procedure: HEMOSTASIS CLIP PLACEMENT;  Surgeon: [name], MD;  Location: [facility] ENDOSCOPY;  Service: Gastroenterology;;   IR REMOVAL TUN CV CATH W/O FL  02/15/2020   POLYPECTOMY  09/19/2019   Procedure: POLYPECTOMY;  Surgeon: Danie Binder, MD;  [facility];  Service: Endoscopy;;   POLYPECTOMY  01/19/2021   Procedure: POLYPECTOMY;  Surgeon: [name], MD;  Location[facility] ENDOSCOPY;  Service: Gastroenterology;;   UMBILICAL HERNIA REPAIR  2017   exploratory laparotomy, abdominal washout    Family History  Problem Relation Age of Onset   Colon cancer Father 21       deceased age 21   Breast cancer Sister        deceased   Diabetes Sister    Stroke  Mother    Healthy Son    Healthy Daughter    Lung cancer Neg Hx    Ovarian cancer Neg Hx     Social History   Socioeconomic History   Marital status: Married    Spouse name: Not on file   Number of children: 2   Years of education: Not on file   Highest education level: Not on file  Occupational History   Occupation: RETIRED    Employer: SELF EMPLOYED  Tobacco Use   Smoking status: Former    Packs/day: 2.00    Years: 25.00    Total pack years: 50.00    Types: Cigarettes    Quit date: 01/28/1986    Years since quitting: 36.0   Smokeless tobacco: Never  Vaping Use   Vaping Use: Never used  Substance and Sexual Activity   Alcohol use: No    Comment: quit in 05/2013   Drug use: No   Sexual activity: Not Currently  Other Topics Concern   Not on file  Social History Narrative   Two step children from second marriage (divorced) who live with him along with some grandchildren.    Social Determinants of Health   Financial Resource Strain: Not  on file  Food Insecurity: No Food Insecurity (09/30/2021)   Hunger Vital Sign    Worried About Running Out of Food in the Last Year: Never true    Ran Out of Food in the Last Year: Never true  Transportation Needs: No Transportation Needs (09/30/2021)   PRAPARE - Hydrologist (Medical): No    Lack of Transportation (Non-Medical): No  Physical Activity: Inactive (05/30/2020)   Exercise Vital Sign    Days of Exercise per Week: 0 days    Minutes of Exercise per Session: 0 min  Stress: Not on file  Social Connections: Not on file  Intimate Partner Violence: Not At Risk (05/30/2020)   Humiliation, Afraid, Rape, and Kick questionnaire    Fear of Current or Ex-Partner: No    Emotionally Abused: No    Physically Abused: No    Sexually Abused: No     Review of System:   General: Negative for anorexia,fever, chills, fatigue, +weakness. Reports slow weight loss of unspecified amt. Down documented 12 pounds  since 05/2021 Eyes: Negative for vision changes.  ENT: Negative for hoarseness, difficulty swallowing , nasal congestion. CV: Negative for chest pain, angina, palpitations, dyspnea on exertion, peripheral edema.  Respiratory: Negative for dyspnea at rest, dyspnea on exertion, cough, sputum, wheezing.  GI: See history of present illness. GU:  Negative for dysuria, hematuria, urinary incontinence, urinary frequency, nocturnal urination.  MS: Negative for joint pain. + low back pain.  Derm: Negative for rash or itching.  Neuro: Negative for weakness, abnormal sensation, seizure, frequent headaches, memory loss, confusion.  Psych: Negative for anxiety, depression, suicidal ideation, hallucinations.  Endo: Negative for unusual weight change.  Heme: Negative for bruising or bleeding. Allergy: Negative for rash or hives.      Physical Examination:   Vital signs in last 24 hours: Temp:  [98.5 F (36.9 C)-99 F (37.2 C)] 98.5 F (36.9 C) (09/07 0424) Pulse Rate:  [77-80] 80 (09/07 0424) Resp:  [16] 16 (09/07 0424) BP: (150-160)/(65-79) 160/79 (09/07 0424) SpO2:  [99 %-100 %] 100 % (09/07 0424) Last BM Date : 02/04/22  General: chronically ill appearing male in no acute distress. Sitting up in chair eating breakfast. Head: Normocephalic, atraumatic.   Eyes: Conjunctiva pink, no icterus. Mouth: Oropharyngeal mucosa moist and pink , no lesions erythema or exudate. Neck: Supple without thyromegaly, masses, or lymphadenopathy.  Lungs: Clear to auscultation bilaterally.  Heart: Regular rate and rhythm, no murmurs rubs or gallops.  Abdomen: Bowel sounds are normal, nontender, nondistended, no hepatosplenomegaly or masses, no abdominal bruits or hernia , no rebound or guarding.  Exam limited as patient was sitting in chair eating breakfast.  Rectal: not performed Extremities: No lower extremity edema, clubbing, deformity.  Neuro: Alert and oriented x 4 , grossly normal neurologically.  Skin:  Warm and dry, no rash or jaundice.   Psych: Alert and cooperative, normal mood and affect.        Intake/Output from previous day: 09/06 0701 - 09/07 0700 In: 1059.9 [P.O.:960; IV Piggyback:99.9] Out: 250 [Urine:250] Intake/Output this shift: No intake/output data recorded.  Lab Results:   CBC Recent Labs    02/02/22 1327 02/03/22 0333 02/03/22 1929 02/04/22 0301  WBC 3.5* 2.9*  --  2.5*  HGB 7.1* 6.4* 8.7* 8.8*  HCT 22.9* 20.7* 27.0* 27.5*  MCV 106.5* 107.3*  --  98.2  PLT 111* 89*  --  85*   BMET Recent Labs    02/03/22 0333 02/04/22 0301 02/05/22  0306  NA 133* 133* 132*  K 4.2 3.0* 3.1*  CL 99 97* 96*  CO2 '25 26 26  '$ GLUCOSE 110* 166* 231*  BUN 39* 20 29*  CREATININE 4.96* 2.92* 4.07*  CALCIUM 9.0 8.0* 8.2*   LFT Recent Labs    02/02/22 1327 02/04/22 0250 02/04/22 0301 02/05/22 0306  BILITOT 1.2 1.4*  --   --   BILIDIR  --  0.3*  --   --   IBILI  --  1.1*  --   --   ALKPHOS 144* 122  --   --   AST 46* 37  --   --   ALT 32 25  --   --   PROT 6.6 5.9*  --   --   ALBUMIN 2.8* 2.4* 2.4* 2.5*    Lipase No results for input(s): "LIPASE" in the last 72 hours.  PT/INR No results for input(s): "LABPROT", "INR" in the last 72 hours.   Hepatitis Panel Recent Labs    02/03/22 0329 02/03/22 1645  HEPBSAG NON REACTIVE  --   HCVAB  --  NON REACTIVE     Imaging Studies:   CT HEAD WO CONTRAST (5MM)  Result Date: 02/03/2022 CLINICAL DATA:  Initial evaluation for encephalopathy, multiple falls. EXAM: CT HEAD WITHOUT CONTRAST TECHNIQUE: Contiguous axial images were obtained from the base of the skull through the vertex without intravenous contrast. RADIATION DOSE REDUCTION: This exam was performed according to the departmental dose-optimization program which includes automated exposure control, adjustment of the mA and/or kV according to patient size and/or use of iterative reconstruction technique. COMPARISON:  Prior CT from 04/01/2020. FINDINGS: Brain:  Age-related cerebral atrophy with chronic small vessel ischemic disease. Small remote left cerebellar infarct noted, stable. No acute intracranial hemorrhage. No acute large vessel territory infarct. No mass lesion, midline shift or mass effect. No hydrocephalus or extra-axial fluid collection. Vascular: No abnormal hyperdense vessel. Scattered vascular calcifications noted within the carotid siphons. Skull: Scalp soft tissues demonstrate no acute finding. Calvarium intact. Sinuses/Orbits: Globes orbital soft tissues within normal limits. Paranasal sinuses and mastoid air cells are clear. Other: None. IMPRESSION: 1. No acute intracranial abnormality. 2. Age-related cerebral atrophy with chronic small vessel ischemic disease, with a small remote left cerebellar infarct. Electronically Signed   By: Jeannine Boga M.D.   On: 02/03/2022 01:49   DG Chest 2 View  Result Date: 02/02/2022 CLINICAL DATA:  Dialysis, missed dialysis, weakness, multiple falls EXAM: CHEST - 2 VIEW COMPARISON:  08/10/2021 FINDINGS: Gross cardiomegaly. Layering bilateral pleural effusions, similar to prior examination. Pulmonary vascular prominence. Osseous structures unremarkable. IMPRESSION: Gross cardiomegaly with pulmonary vascular prominence and layering bilateral pleural effusions, similar to prior examination. Electronically Signed   By: Delanna Ahmadi M.D.   On: 02/02/2022 15:44   US Paracentesis  Result Date: 01/15/2022 INDICATION: Recurrent ascites, cirrhosis, last para 11/20/21 EXAM: ULTRASOUND GUIDED  PARACENTESIS MEDICATIONS: None. COMPLICATIONS: None immediate. PROCEDURE: Informed written consent was obtained from the patient after a discussion of the risks, benefits and alternatives to treatment. A timeout was performed prior to the initiation of the procedure. Initial ultrasound scanning demonstrates a moderate amount of ascites within the left lower abdominal quadrant. The left lower abdomen was prepped and draped in  the usual sterile fashion. 1% lidocaine was used for local anesthesia. Following this, a 19 gauge, 7-cm, Yueh catheter was introduced. An ultrasound image was saved for documentation purposes. The paracentesis was performed. The catheter was removed and a dressing was applied. The patient  tolerated the procedure well without immediate post procedural complication. FINDINGS: A total of approximately 800cc of turbid fluid was removed. Samples were sent to the laboratory as requested by the clinical team. IMPRESSION: Successful ultrasound-guided paracentesis yielding 0.8 liters of peritoneal fluid. Electronically Signed   By: Lavonia Dana M.D.   On: 01/15/2022 13:07  [4 week]  Assessment:   76 year old male with decompensated alcoholic cirrhosis (remote alcohol use), end-stage renal disease on hemodialysis, A-fib not on anticoagulation, diabetes, CHF, hypertension, IDA presenting with generalized weakness/falls.  GI consulted for acute blood loss anemia, portal hypertensive gastropathy.   Decompensated alcoholic cirrhosis: No alcohol since 2014.  Course has been complicated by esophageal variceal bleeding, recurrent SBP, hepatic encephalopathy, anasarca/ascites requiring paracenteses.  Presenting with overall generalized weakness, falls, some confusion.  Patient has had intermittent watery stools over the past couple of weeks, wife was concerned he may have recurrent C. difficile.  His lactulose had been held at home but he has continued Xifaxan twice daily. Since admission, he is alert. Confusion has resolved.   Acute on chronic anemia: patient reported single episode of "black stool" at home. His stools are typically dark on iron.  Been receiving IV iron monthly along with B12 injections.  Reportedly last transfusion was around May prior to this admission.  When he presented on September 4 his hemoglobin was 7.1, had been 8.14 weeks prior.  On day 2 his hemoglobin had dropped to 6.4.  He received 2 units of  packed red blood cells UP to 8.8 today.  He has had several stools this admission, none reported to be black.  Last EGD December 2022 with no significant esophageal varices at that time status post previous banding.  Friability noted of gastric mucosa.  Last colonoscopy August 2022 with multiple tubular adenomas removed, rectal varices noted.  Patient could have easily bled from portal gastropathy.  UTI: Greater than 100,000 colony-forming units of E. coli on urine culture.  Management per attending.  Plan:   Continue Xifaxan 550 mg twice daily. Continue H2 blocker daily. Continue lactulose, titrate to 2-3 soft stools daily. If clinical concern for C. difficile, check stool. He will continue to follow-up with hematology as an outpatient. It is not clear that he would benefit from an upper endoscopy at this point.  To discuss further with Dr. Jenetta Downer.   LOS: 0 days   We would like to thank you for the opportunity to participate in the care of Jeffrey [name].  Laureen Ochs. Bernarda Caffey [facility] [phone] 9/7/20238:37 AM

## 2022-02-05 NOTE — Assessment & Plan Note (Addendum)
UA >50 WBC with altered mental status Urine culture = ESBL Ecoli and E faecium Continue merrem--had 4 days in hospital vanc dosed on 9/7, 9/9 --d/c with one final dose fosfomycin 02/08/22 to complete regimen

## 2022-02-05 NOTE — Progress Notes (Signed)
Patient ID: Jeffrey [name], male   DOB: 08/24/1945, 76 y.o.   MRN: [id number] S: No acute events, no complaints. Does report some nose pain from O2 (he reports that this typically happens when not receiving saline nasal sprays). He is eager to get out of bed into chair, PT at bedside. O:BP (!) 160/79   Pulse 80   Temp 98.5 F (36.9 C) (Oral)   Resp 16   Ht '5\' 5"'$  (1.651 m)   Wt 84.7 kg   SpO2 100%   BMI 31.07 kg/m   Intake/Output Summary (Last 24 hours) at 02/05/2022 0937 Last data filed at 02/05/2022 3419 Gross per 24 hour  Intake 819.88 ml  Output 250 ml  Net 569.88 ml   Intake/Output: I/O last 3 completed shifts: In: 1059.9 [P.O.:960; IV Piggyback:99.9] Out: 750 [Urine:250; Stool:500]  Intake/Output this shift:  No intake/output data recorded. Weight change:  Gen: NAD CVS: RRR Resp: CTA Abd: +BS, soft, NT Ext: no edema, LUE AVF +T/B Neuro: awake, alert  Recent Labs  Lab 02/02/22 1327 02/03/22 0333 02/04/22 0250 02/04/22 0301 02/05/22 0306  NA 133* 133*  --  133* 132*  K 4.2 4.2  --  3.0* 3.1*  CL 96* 99  --  97* 96*  CO2 25 25  --  26 26  GLUCOSE 158* 110*  --  166* 231*  BUN 34* 39*  --  20 29*  CREATININE 4.43* 4.96*  --  2.92* 4.07*  ALBUMIN 2.8*  --  2.4* 2.4* 2.5*  CALCIUM 9.2 9.0  --  8.0* 8.2*  PHOS  --   --   --  3.0 4.6  AST 46*  --  37  --   --   ALT 32  --  25  --   --    Liver Function Tests: Recent Labs  Lab 02/02/22 1327 02/04/22 0250 02/04/22 0301 02/05/22 0306  AST 46* 37  --   --   ALT 32 25  --   --   ALKPHOS 144* 122  --   --   BILITOT 1.2 1.4*  --   --   PROT 6.6 5.9*  --   --   ALBUMIN 2.8* 2.4* 2.4* 2.5*   No results for input(s): "LIPASE", "AMYLASE" in the last 168 hours. Recent Labs  Lab 02/03/22 0806 02/04/22 0301 02/05/22 0306  AMMONIA 64* 74* 35   CBC: Recent Labs  Lab 02/02/22 1327 02/03/22 0333 02/03/22 1929 02/04/22 0301  WBC 3.5* 2.9*  --  2.5*  NEUTROABS 3.0  --   --   --   HGB 7.1* 6.4* 8.7* 8.8*  HCT  22.9* 20.7* 27.0* 27.5*  MCV 106.5* 107.3*  --  98.2  PLT 111* 89*  --  85*   Cardiac Enzymes: No results for input(s): "CKTOTAL", "CKMB", "CKMBINDEX", "TROPONINI" in the last 168 hours. CBG: Recent Labs  Lab 02/03/22 2028 02/04/22 0720 02/04/22 1100 02/04/22 1620 02/04/22 2121  GLUCAP 97 175* 192* 204* 197*    Iron Studies: No results for input(s): "IRON", "TIBC", "TRANSFERRIN", "FERRITIN" in the last 72 hours. Studies/Results: No results found.   sodium chloride   Intravenous Once   sodium chloride   Intravenous Once   allopurinol  300 mg Oral Daily   Chlorhexidine Gluconate Cloth  6 each Topical Q0600   diltiazem  120 mg Oral Daily   famotidine  20 mg Oral Daily   insulin aspart  0-5 Units Subcutaneous QHS   insulin aspart  0-9  Units Subcutaneous TID WC   lactulose  10 g Oral BID   loratadine  10 mg Oral Daily   metoprolol succinate  25 mg Oral QHS   metoprolol succinate  25 mg Oral Once per day on Sun Mon Wed Fri   rifaximin  550 mg Oral BID    BMET    Component Value Date/Time   NA 132 (L) 02/05/2022 0306   K 3.1 (L) 02/05/2022 0306   CL 96 (L) 02/05/2022 0306   CO2 26 02/05/2022 0306   GLUCOSE 231 (H) 02/05/2022 0306   BUN 29 (H) 02/05/2022 0306   CREATININE 4.07 (H) 02/05/2022 0306   CREATININE 2.65 (H) 11/16/2019 1203   CALCIUM 8.2 (L) 02/05/2022 0306   CALCIUM 8.3 (L) 09/19/2020 1024   GFRNONAA 15 (L) 02/05/2022 0306   GFRNONAA 23 (L) 11/16/2019 1203   GFRAA 15 (L) 02/20/2020 0636   GFRAA 27 (L) 11/16/2019 1203   CBC    Component Value Date/Time   WBC 2.5 (L) 02/04/2022 0301   RBC 2.80 (L) 02/04/2022 0301   HGB 8.8 (L) 02/04/2022 0301   HCT 27.5 (L) 02/04/2022 0301   HCT 28 02/25/2012 1034   PLT 85 (L) 02/04/2022 0301   PLT 125 02/25/2012 1034   MCV 98.2 02/04/2022 0301   MCV 82.0 02/25/2012 1034   MCH 31.4 02/04/2022 0301   MCHC 32.0 02/04/2022 0301   RDW 22.8 (H) 02/04/2022 0301   LYMPHSABS 0.2 (L) 02/02/2022 1327   MONOABS 0.2  02/02/2022 1327   EOSABS 0.0 02/02/2022 1327   BASOSABS 0.0 02/02/2022 1327    Dialysis Orders: Center: [facility]  on MWF . EDW 78.5 kg HD Bath 2K/2.5Ca  Time 4 hours Heparin none. Access LUE AVF BFR 350  DFR 500    Micera 200 mcg IV every 2 weeks  Venofer  50 mg IV weekly   Assessment/Plan:  Acute on chronic anemia - due to chronic blood loss.  Improved with blood transfusion 02/03/22. Hgb 8.8 on 9/6  Hepatic encephalopathy - acute on chronic. Mentation seems to have improved over the last couple of days  ESRD -  Normally MWF but off schedule.  HD today, next treatment planned for Saturday. If he is to be discharged after HD today, can resume his outpatient schedule tomorrow  Hypertension/volume  -  stable  Anemia  - as above for blood transfusion.  Metabolic bone disease -   continue with home meds  Nutrition -  renal diet  Weakness and falls - likely due to anemia but will need PT/OT evaluation.   [name], MD [facility]

## 2022-02-05 NOTE — TOC Initial Note (Signed)
Transition of Care [facility]) - Initial/Assessment Note    Patient Details  Name: Jeffrey [name] MRN: [id number] Date of Birth: May 25, 1946  [facility] CM/SW Contact:    Ihor Gully, LCSW Phone Number: 02/05/2022, 5:02 PM  Clinical Narrative:                 Patient from home with spouse. Admitted for acute metabolic encephalopathy. Considered High risk for readmission. Has walker, oxygen 3L provided by adapt, bsc, bidet, step to bed. Has fallen three times in the last week. On HD, MWF, at [facility] in [place]. PT recommends SNF. Agreeable to SNF. Referred to facilities of choice.  Authorization started.   Expected Discharge Plan: Skilled Nursing Facility Barriers to Discharge: Continued Medical Work up   Patient Goals and CMS Choice Patient states their goals for this hospitalization and ongoing recovery are:: rehab then home      Expected Discharge Plan and Services Expected Discharge Plan: [place] Acute Care Choice: [place] arrangements for the past 2 months: Single Family Home                                      Prior Living Arrangements/Services Living arrangements for the past 2 months: Single Family Home Lives with:: Spouse Patient language and need for interpreter reviewed:: Yes Do you feel safe going back to the place where you live?: Yes      Need for Family Participation in Patient Care: Yes (Comment) Care giver support system in place?: Yes (comment) Current home services: DME (wealker, oxygen 3L provided by adapt, bsc, bidet, step to bed) Criminal Activity/Legal Involvement Pertinent to Current Situation/Hospitalization: No - Comment as needed  Activities of Daily Living      Permission Sought/Granted Permission sought to share information with : Family Supports    Share Information with NAME: wife, Mrs. Cloward           Emotional Assessment     Affect (typically observed):  Appropriate Orientation: : Oriented to Self, Oriented to Place, Oriented to  Time, Oriented to Situation Alcohol / Substance Use: Not Applicable Psych Involvement: No (comment)  Admission diagnosis:  Encephalopathy, hepatic ([place]) [K76.82] Generalized weakness [R53.1] Urinary tract infection associated with indwelling urethral catheter, initial encounter ([place]) [W09.811B, J47.8] Acute metabolic encephalopathy [G95.62] Patient Active Problem List   Diagnosis Date Noted   UTI (urinary tract infection) due to urinary indwelling Foley catheter ([place]) 02/05/2022   Symptomatic anemia 07/28/2021   Hypokalemia 07/28/2021   Hypomagnesemia 07/28/2021   Rectal varices    Hyponatremia 01/17/2021   Thrombocytopenia ([place]) 01/17/2021   B12 deficiency 11/21/2020   Iron deficiency anemia due to chronic blood loss 06/25/2020   Gastroesophageal reflux disease 06/06/2020   Bacteremia due to Gram-positive bacteria 04/03/2020   Acute on chronic respiratory failure with hypoxia and hypercapnia ([place]) 04/03/2020   Lobar pneumonia ([place]) 04/03/2020   Volume overload 04/02/2020   Anxiety with depression 04/02/2020   Asymptomatic bacteriuria 04/02/2020   Pain of upper abdomen 02/22/2020   Urine leukocytes increased 02/22/2020   Falls    Minor head injury    Physical deconditioning    Acute hyponatremia 02/19/2020   Fever and chills 02/09/2020   Advanced care planning/counseling discussion    DNR (do not resuscitate)    Alcoholic cirrhosis of liver ([place]) 02/08/2020   Back pain 02/08/2020  Status post thoracentesis    Acute metabolic encephalopathy 11/29/1599   Bilateral pleural effusion 12/31/2019   Hypoglycemia 12/31/2019   Hyperammonemia ([place]) 12/31/2019   Class 2 obesity 12/31/2019   Hypoalbuminemia 12/31/2019   Nosebleed    Goals of care, counseling/discussion    Palliative care by specialist    Acute hepatic encephalopathy ([place]) 12/13/2019   Encephalopathy acute    Hyperkalemia    Chronic  hyponatremia    Anemia 11/16/2019   Acute blood loss anemia 09/20/2019   GI bleed    Acute on chronic anemia 09/18/2019   Generalized weakness 09/18/2019   Controlled type 2 diabetes mellitus with chronic kidney disease on chronic dialysis, with long-term current use of insulin ([place]) 09/18/2019   ESRD on dialysis ([place]) 09/18/2019   Clostridioides difficile infection 08/31/2019   Edema 09/32/3557   Alcoholic cirrhosis ([place]) 32/20/2542   Essential hypertension, benign 10/20/2017   Morbid obesity ([place]) 10/20/2017   Mixed hyperlipidemia 10/20/2017   Ascites    SBP (spontaneous bacterial peritonitis) ([place]) 01/12/2016   Shigella dysenteriae 01/11/2016   Diarrhea in adult patient 01/10/2016   Liver cirrhosis ([place]) 70/62/3762   Alcoholic cirrhosis of liver with ascites ([place]) 03/09/2012   Heme positive stool 03/09/2012   Anemia due to chronic blood loss 03/09/2012   Pancytopenia ([place]) 03/09/2012   FH: colon cancer 03/09/2012   Esophageal varices in alcoholic cirrhosis ([place]) 83/15/1761   PCP:  Monico Blitz, MD Pharmacy:   [facility] [address] [place] [address] Phone: [phone] Fax: [phone]     Social Determinants of Health (SDOH) Interventions    Readmission Risk Interventions    01/20/2021    2:33 PM 04/03/2020   10:46 AM  Readmission Risk Prevention Plan  Transportation Screening Complete Complete  PCP or Specialist Appt within 3-5 Days Complete   HRI or [place] Complete   Social Work Consult for [place] Planning/Counseling Complete   Palliative Care Screening Not Applicable   Medication Review Press photographer) Complete Complete  HRI or [place]  Complete  SW Recovery Care/Counseling Consult  Complete  Palliative Care Screening  Not [place]  Not Applicable

## 2022-02-06 ENCOUNTER — Telehealth: Payer: Self-pay | Admitting: Gastroenterology

## 2022-02-06 ENCOUNTER — Encounter (HOSPITAL_COMMUNITY)
Admission: EM | Disposition: A | Discharge status: Skilled Nursing Facility | Payer: Self-pay | Source: Home / Self Care | Attending: Internal Medicine

## 2022-02-06 ENCOUNTER — Encounter (HOSPITAL_COMMUNITY): Payer: Self-pay | Admitting: Internal Medicine

## 2022-02-06 ENCOUNTER — Inpatient Hospital Stay (HOSPITAL_COMMUNITY): Payer: Medicare Other | Admitting: Certified Registered Nurse Anesthetist

## 2022-02-06 DIAGNOSIS — K7460 Unspecified cirrhosis of liver: Secondary | ICD-10-CM | POA: Diagnosis not present

## 2022-02-06 DIAGNOSIS — K766 Portal hypertension: Secondary | ICD-10-CM | POA: Diagnosis not present

## 2022-02-06 DIAGNOSIS — I8510 Secondary esophageal varices without bleeding: Secondary | ICD-10-CM | POA: Diagnosis not present

## 2022-02-06 DIAGNOSIS — D509 Iron deficiency anemia, unspecified: Secondary | ICD-10-CM

## 2022-02-06 DIAGNOSIS — K7030 Alcoholic cirrhosis of liver without ascites: Secondary | ICD-10-CM | POA: Diagnosis not present

## 2022-02-06 DIAGNOSIS — K922 Gastrointestinal hemorrhage, unspecified: Secondary | ICD-10-CM

## 2022-02-06 DIAGNOSIS — K2980 Duodenitis without bleeding: Secondary | ICD-10-CM

## 2022-02-06 DIAGNOSIS — G9341 Metabolic encephalopathy: Secondary | ICD-10-CM | POA: Diagnosis not present

## 2022-02-06 DIAGNOSIS — K7682 Hepatic encephalopathy: Secondary | ICD-10-CM | POA: Diagnosis not present

## 2022-02-06 DIAGNOSIS — K3189 Other diseases of stomach and duodenum: Secondary | ICD-10-CM

## 2022-02-06 DIAGNOSIS — I8500 Esophageal varices without bleeding: Secondary | ICD-10-CM

## 2022-02-06 DIAGNOSIS — D62 Acute posthemorrhagic anemia: Secondary | ICD-10-CM | POA: Diagnosis not present

## 2022-02-06 HISTORY — PX: HEMOSTASIS CLIP PLACEMENT: SHX6857

## 2022-02-06 HISTORY — PX: ESOPHAGOGASTRODUODENOSCOPY (EGD) WITH PROPOFOL: SHX5813

## 2022-02-06 HISTORY — PX: HOT HEMOSTASIS: SHX5433

## 2022-02-06 LAB — RENAL FUNCTION PANEL
Albumin: 2.5 g/dL — ABNORMAL LOW (ref 3.5–5.0)
Anion gap: 9 (ref 5–15)
BUN: 22 mg/dL (ref 8–23)
CO2: 29 mmol/L (ref 22–32)
Calcium: 8.2 mg/dL — ABNORMAL LOW (ref 8.9–10.3)
Chloride: 94 mmol/L — ABNORMAL LOW (ref 98–111)
Creatinine, Ser: 3.29 mg/dL — ABNORMAL HIGH (ref 0.61–1.24)
GFR, Estimated: 19 mL/min — ABNORMAL LOW (ref >60)
Glucose, Bld: 232 mg/dL — ABNORMAL HIGH (ref 70–99)
Phosphorus: 3.4 mg/dL (ref 2.5–4.6)
Potassium: 3 mmol/L — ABNORMAL LOW (ref 3.5–5.1)
Sodium: 132 mmol/L — ABNORMAL LOW (ref 135–145)

## 2022-02-06 LAB — CBC
HCT: 30.8 % — ABNORMAL LOW (ref 39.0–52.0)
Hemoglobin: 9.9 g/dL — ABNORMAL LOW (ref 13.0–17.0)
MCH: 31.6 pg (ref 26.0–34.0)
MCHC: 32.1 g/dL (ref 30.0–36.0)
MCV: 98.4 fL (ref 80.0–100.0)
Platelets: 61 10*3/uL — ABNORMAL LOW (ref 150–400)
RBC: 3.13 MIL/uL — ABNORMAL LOW (ref 4.22–5.81)
RDW: 22.5 % — ABNORMAL HIGH (ref 11.5–15.5)
WBC: 2.6 10*3/uL — ABNORMAL LOW (ref 4.0–10.5)
nRBC: 0 % (ref 0.0–0.2)

## 2022-02-06 LAB — GLUCOSE, CAPILLARY
Glucose-Capillary: 228 mg/dL — ABNORMAL HIGH (ref 70–99)
Glucose-Capillary: 186 mg/dL — ABNORMAL HIGH (ref 70–99)
Glucose-Capillary: 229 mg/dL — ABNORMAL HIGH (ref 70–99)
Glucose-Capillary: 289 mg/dL — ABNORMAL HIGH (ref 70–99)

## 2022-02-06 SURGERY — ESOPHAGOGASTRODUODENOSCOPY (EGD) WITH PROPOFOL
Anesthesia: General

## 2022-02-06 MED ORDER — SODIUM CHLORIDE 0.9 % IV SOLN: INTRAVENOUS | Status: DC

## 2022-02-06 MED ORDER — LACTATED RINGERS IV SOLN: INTRAVENOUS | Status: DC

## 2022-02-06 MED ORDER — LIDOCAINE HCL (CARDIAC) PF 100 MG/5ML IV SOSY
PREFILLED_SYRINGE | INTRAVENOUS | Status: DC | PRN
  Administered 2022-02-06: 50 mg via INTRATRACHEAL

## 2022-02-06 MED ORDER — PROPOFOL 10 MG/ML IV BOLUS
INTRAVENOUS | Status: DC | PRN
  Administered 2022-02-06: 30 mg via INTRAVENOUS
  Administered 2022-02-06: 50 mg via INTRAVENOUS
  Administered 2022-02-06: 30 mg via INTRAVENOUS
  Administered 2022-02-06: 50 mg via INTRAVENOUS

## 2022-02-06 MED ORDER — POTASSIUM CHLORIDE 10 MEQ/100ML IV SOLN
10.0000 meq | INTRAVENOUS | Status: AC
  Administered 2022-02-06 (×2): 10 meq via INTRAVENOUS
  Filled 2022-02-06 (×2): qty 100

## 2022-02-06 NOTE — Progress Notes (Signed)
PROGRESS NOTE  Jeffrey [name] AST:[id number] DOB: [birth year] DOA: 02/02/2022 PCP: Monico Blitz, MD  Brief History:  76 y.o. 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  Patient was brought to the ED with reports of weakness over the past few weeks and multiple falls recently.  At the time of my evaluation, patient is alone,  awake, but appears lethargic, slightly drowsy, answers a few questions appropriately, but also appears slightly confused.    I talked to patient's spouse on the phone, she confirms drowsiness, and intermittent confusion over the past few days.  She reports over the past 2 weeks, patient has been having at least 2-3 watery stools every day, so she did not give him his lactulose the past 2 weeks, but he was getting his rifaximin.  At baseline patient has 1-2 bowel movements a day and usually not watery, so and she was concerned for C. difficile infection.  Patient is also taking colchicine for gout, he has been on it for about 6 weeks now.     Patient's HD schedule is Monday Wednesday Friday, he did not get dialyzed today, instead he came to the ED.  Patient denies any pain to me.  Reports a chronic mild cough that is unchanged.  He does make some urine, he denies any urinary symptoms at this time.     Stopped drinking alcohol 2014 per spouse.   ED Course: Tmax 98.8.  Heart rate 69-75.  Respiratory rate 12-20.  Blood pressure systolic 989-211.  O2 sats greater than 93% on 3 L. Ammonia level 65. Hemoglobin 7.1. UA with large leukocytes many bacteria greater than 50 WBCs. 2 view chest x-ray-gross cardiomegaly with pulmonary vascular prominence and layering bilateral pleural effusions similar to prior exam. Zosyn started for UTI.  Hospitalist to admit.    Assessment and  Plan: * Acute metabolic encephalopathy Multifactorial including UTI, hyperammonemia, ABLA -continue merrem -transfused 2 units PRBC -continue lactulose Mental status is now back to baseline  Acute hepatic encephalopathy [facility]) He presented lethargic with generalized weakness.  Also reports of multiple falls.  Ammonia level elevated at 65>>35 -Lactulose ordered and rifaximin. -head CT--no acute findings -Hold gabapentin, as needed Xanax, hydroxyzine, Percocet, mirtazapine, tizanidine, Effexor  UTI (urinary tract infection) due to urinary indwelling Foley catheter (HCC) UA >50 WBC with altered mental status Urine culture = ESBL Ecoli and E faecium Continue merrem vanc dosed on 9/7  Acute blood loss anemia Presented with Hgb 6.4 Baseline Hgb 8-9 Due to portal HTN gastropathy vs gastric AVM 9/8 EGD-- G1 esoph varices; portal HTN gastropathy; red blood in posterior gastric body  tx with APC/hemoclip Transfused 2 units this admit Hgb stable since transfusion  Falls Multiple falls and generalized weakness.  Falls weakness, spouse reports patient did not hit his head. -CT head no acute findings -PT eval>>SNF  ESRD on dialysis [facility]) Schedule Monday Wednesday Friday.  Missed HD today.  Chest X-ray with stable layering bilateral effusions.  -nephrology consultation appreciated -HD on 02/07/22 prior to d/c  Generalized weakness --suspect acutely exacerbated by anemia --Hg down to 6.4 --transfused 2 units PRBC --PT eval>>SNF  Iron deficiency anemia due to chronic blood loss Hemoglobin 6.4, baseline ~8. -transfuse 2 units PRBC with hemodialysis - hold subcutaneous heparin due to drop in Hg -05/22/21 EGD--portal HTN gastropathy, denuded esophageal  mucosa -01/19/21 colonscopy--large non bleeding rectal varices; polyp prox desc colon, inflammatory polyp splenic flexure  Alcoholic cirrhosis ([place]) -- will need to follow up with GI outpatient for ongoing management   Essential  hypertension, benign Stable. -Resume diltiazem, metoprolol, - Hold Lasix 80 mg daily, spironolactone  Diarrhea in adult patient 2-3 episodes of watery stools over the past 2 weeks.  So has not taken Constulose in 2 weeks also.  History of C. difficile infection.  Afebrile.  Stable mild leukopenia, no abdominal pain.  Was prescribed colchicine about 6 weeks ago for gout, which could have precipitated the diarrhea -Stop colchicine for now -no abd pain -low suspicion for invasive C diff infection  Pancytopenia ([place]) Due to liver cirrhosis Overall stable except Hgb Trend CBC   Family Communication:   spouse updated 9/8   Consultants:  renal   Code Status:  FULL    DVT Prophylaxis:  SCDs   Procedures: As Listed in Progress Note Above   Antibiotics: Merrem 9/6>> Vanc 9/7>>     Subjective: Patient denies fevers, chills, headache, chest pain, dyspnea, nausea, vomiting, diarrhea, abdominal pain, dysuria, hematuria, hematochezia, and melena.   Objective: Vitals:   02/06/22 0555 02/06/22 1042 02/06/22 1138 02/06/22 1300  BP: (!) 157/71 (!) 176/81 (!) 129/54 (!) 166/91  Pulse: 90 85 85 87  Resp: 16 18 (!) 24 17  Temp: 98.3 F (36.8 C)  97.6 F (36.4 C) 98.4 F (36.9 C)  TempSrc: Oral   Oral  SpO2: 100% 100% 100% 100%  Weight:      Height:        Intake/Output Summary (Last 24 hours) at 02/06/2022 1754 Last data filed at 02/06/2022 1600 Gross per 24 hour  Intake 609.12 ml  Output 0 ml  Net 609.12 ml   Weight change:  Exam:  General:  Pt is alert, follows commands appropriately, not in acute distress HEENT: No icterus, No thrush, No neck mass, [place]/AT Cardiovascular: RRR, S1/S2, no rubs, no gallops Respiratory: bibasilar crackles. No wheeze Abdomen: Soft/+BS, non tender, non distended, no guarding Extremities: No edema, No lymphangitis, No petechiae, No rashes, no synovitis   Data Reviewed: I have personally reviewed following labs and imaging studies Basic  Metabolic Panel: Recent Labs  Lab 02/02/22 1327 02/03/22 0333 02/04/22 0301 02/05/22 0306 02/06/22 0555  NA 133* 133* 133* 132* 132*  K 4.2 4.2 3.0* 3.1* 3.0*  CL 96* 99 97* 96* 94*  CO2 '25 25 26 26 29  '$ GLUCOSE 158* 110* 166* 231* 232*  BUN 34* 39* 20 29* 22  CREATININE 4.43* 4.96* 2.92* 4.07* 3.29*  CALCIUM 9.2 9.0 8.0* 8.2* 8.2*  MG 1.7  --   --   --   --   PHOS  --   --  3.0 4.6 3.4   Liver Function Tests: Recent Labs  Lab 02/02/22 1327 02/04/22 0250 02/04/22 0301 02/05/22 0306 02/06/22 0555  AST 46* 37  --   --   --   ALT 32 25  --   --   --   ALKPHOS 144* 122  --   --   --   BILITOT 1.2 1.4*  --   --   --   PROT 6.6 5.9*  --   --   --   ALBUMIN 2.8* 2.4* 2.4* 2.5* 2.5*   No results for input(s): "LIPASE", "AMYLASE" in the last 168 hours. Recent Labs  Lab 02/02/22 1346 02/03/22 0806 02/04/22 0301 02/05/22 0306  AMMONIA 65* 64* 74* 35  Coagulation Profile: No results for input(s): "INR", "PROTIME" in the last 168 hours. CBC: Recent Labs  Lab 02/02/22 1327 02/03/22 0333 02/03/22 1929 02/04/22 0301 02/06/22 0555  WBC 3.5* 2.9*  --  2.5* 2.6*  NEUTROABS 3.0  --   --   --   --   HGB 7.1* 6.4* 8.7* 8.8* 9.9*  HCT 22.9* 20.7* 27.0* 27.5* 30.8*  MCV 106.5* 107.3*  --  98.2 98.4  PLT 111* 89*  --  85* 61*   Cardiac Enzymes: No results for input(s): "CKTOTAL", "CKMB", "CKMBINDEX", "TROPONINI" in the last 168 hours. BNP: Invalid input(s): "POCBNP" CBG: Recent Labs  Lab 02/05/22 1607 02/05/22 2206 02/06/22 0705 02/06/22 1239 02/06/22 1606  GLUCAP 188* 266* 228* 186* 229*   HbA1C: No results for input(s): "HGBA1C" in the last 72 hours. Urine analysis:    Component Value Date/Time   COLORURINE YELLOW 02/02/2022 1327   APPEARANCEUR CLOUDY (A) 02/02/2022 1327   LABSPEC 1.011 02/02/2022 1327   PHURINE 5.0 02/02/2022 1327   GLUCOSEU NEGATIVE 02/02/2022 1327   HGBUR MODERATE (A) 02/02/2022 1327   BILIRUBINUR NEGATIVE 02/02/2022 1327   KETONESUR  NEGATIVE 02/02/2022 1327   PROTEINUR 100 (A) 02/02/2022 1327   NITRITE NEGATIVE 02/02/2022 1327   LEUKOCYTESUR LARGE (A) 02/02/2022 1327   Sepsis Labs: '@LABRCNTIP'$ (procalcitonin:4,lacticidven:4) ) Recent Results (from the past 240 hour(s))  Urine Culture     Status: Abnormal   Collection Time: 02/02/22  3:14 PM   Specimen: Urine, Catheterized  Result Value Ref Range Status   Specimen Description   Final    URINE, CATHETERIZED Performed at [facility], [address] [place], [place] [address]    Special Requests   Final    NONE Performed at [facility], [address], [place], [place] [address]    Culture (A)  Final    >=100,000 COLONIES/mL ESCHERICHIA COLI Confirmed Extended Spectrum Beta-Lactamase Producer (ESBL).  In bloodstream infections from ESBL organisms, carbapenems are preferred over piperacillin/tazobactam. They are shown to have a lower risk of mortality. 60,000 COLONIES/mL ENTEROCOCCUS FAECIUM    Report Status 02/05/2022 FINAL  Final   Organism ID, Bacteria ESCHERICHIA COLI (A)  Final   Organism ID, Bacteria ENTEROCOCCUS FAECIUM (A)  Final      Susceptibility   Escherichia coli - MIC*    AMPICILLIN >=32 RESISTANT Resistant     CEFAZOLIN >=64 RESISTANT Resistant     CEFEPIME 4 INTERMEDIATE Intermediate     CEFTRIAXONE >=64 RESISTANT Resistant     CIPROFLOXACIN >=4 RESISTANT Resistant     GENTAMICIN <=1 SENSITIVE Sensitive     IMIPENEM 0.5 SENSITIVE Sensitive     NITROFURANTOIN <=16 SENSITIVE Sensitive     TRIMETH/SULFA >=320 RESISTANT Resistant     AMPICILLIN/SULBACTAM 8 SENSITIVE Sensitive     PIP/TAZO <=4 SENSITIVE Sensitive     * >=100,000 COLONIES/mL ESCHERICHIA COLI   Enterococcus faecium - MIC*    AMPICILLIN >=32 RESISTANT Resistant     NITROFURANTOIN 128 RESISTANT Resistant     VANCOMYCIN 1 SENSITIVE Sensitive     * 60,000 COLONIES/mL ENTEROCOCCUS FAECIUM     Scheduled Meds:  sodium chloride   Intravenous Once   sodium chloride   Intravenous  Once   allopurinol  300 mg Oral Daily   Chlorhexidine Gluconate Cloth  6 each Topical Q0600   diltiazem  120 mg Oral Daily   famotidine  20 mg Oral Daily   insulin aspart  0-5 Units Subcutaneous QHS   insulin aspart  0-9 Units Subcutaneous  TID WC   lactulose  10 g Oral BID   loratadine  10 mg Oral Daily   metoprolol succinate  25 mg Oral QHS   metoprolol succinate  25 mg Oral Once per day on Sun Mon Wed Fri   rifaximin  550 mg Oral BID   Continuous Infusions:  meropenem (MERREM) IV 200 mL/hr at 02/06/22 1694   vancomycin      Procedures/Studies: CT HEAD WO CONTRAST (5MM)  Result Date: 02/03/2022 CLINICAL DATA:  Initial evaluation for encephalopathy, multiple falls. EXAM: CT HEAD WITHOUT CONTRAST TECHNIQUE: Contiguous axial images were obtained from the base of the skull through the vertex without intravenous contrast. RADIATION DOSE REDUCTION: This exam was performed according to the departmental dose-optimization program which includes automated exposure control, adjustment of the mA and/or kV according to patient size and/or use of iterative reconstruction technique. COMPARISON:  Prior CT from 04/01/2020. FINDINGS: Brain: Age-related cerebral atrophy with chronic small vessel ischemic disease. Small remote left cerebellar infarct noted, stable. No acute intracranial hemorrhage. No acute large vessel territory infarct. No mass lesion, midline shift or mass effect. No hydrocephalus or extra-axial fluid collection. Vascular: No abnormal hyperdense vessel. Scattered vascular calcifications noted within the carotid siphons. Skull: Scalp soft tissues demonstrate no acute finding. Calvarium intact. Sinuses/Orbits: Globes orbital soft tissues within normal limits. Paranasal sinuses and mastoid air cells are clear. Other: None. IMPRESSION: 1. No acute intracranial abnormality. 2. Age-related cerebral atrophy with chronic small vessel ischemic disease, with a small remote left cerebellar infarct.  Electronically Signed   By: Jeannine Boga M.D.   On: 02/03/2022 01:49   DG Chest 2 View  Result Date: 02/02/2022 CLINICAL DATA:  Dialysis, missed dialysis, weakness, multiple falls EXAM: CHEST - 2 VIEW COMPARISON:  08/10/2021 FINDINGS: Gross cardiomegaly. Layering bilateral pleural effusions, similar to prior examination. Pulmonary vascular prominence. Osseous structures unremarkable. IMPRESSION: Gross cardiomegaly with pulmonary vascular prominence and layering bilateral pleural effusions, similar to prior examination. Electronically Signed   By: Delanna Ahmadi M.D.   On: 02/02/2022 15:44   US Paracentesis  Result Date: 01/15/2022 INDICATION: Recurrent ascites, cirrhosis, last para 11/20/21 EXAM: ULTRASOUND GUIDED  PARACENTESIS MEDICATIONS: None. COMPLICATIONS: None immediate. PROCEDURE: Informed written consent was obtained from the patient after a discussion of the risks, benefits and alternatives to treatment. A timeout was performed prior to the initiation of the procedure. Initial ultrasound scanning demonstrates a moderate amount of ascites within the left lower abdominal quadrant. The left lower abdomen was prepped and draped in the usual sterile fashion. 1% lidocaine was used for local anesthesia. Following this, a 19 gauge, 7-cm, Yueh catheter was introduced. An ultrasound image was saved for documentation purposes. The paracentesis was performed. The catheter was removed and a dressing was applied. The patient tolerated the procedure well without immediate post procedural complication. FINDINGS: A total of approximately 800cc of turbid fluid was removed. Samples were sent to the laboratory as requested by the clinical team. IMPRESSION: Successful ultrasound-guided paracentesis yielding 0.8 liters of peritoneal fluid. Electronically Signed   By: Lavonia Dana M.D.   On: 01/15/2022 13:07    Orson Eva, DO  [facility]  If 7PM-7AM, please contact night-coverage [email or web address] Password  [email or web address] 02/06/2022, 5:54 PM   LOS: 1 day

## 2022-02-06 NOTE — TOC Progression Note (Signed)
Transition of Care [facility]) - Progression Note    Patient Details  Name: CHAKA JEFFERYS MRN: [id number] Date of Birth: 1945/12/26  Transition of Care [facility]) CM/SW Contact  Ihor Gully, LCSW Phone Number: 02/06/2022, 3:03 PM  Clinical Narrative:    Mrs. Kahl accepts bed offer from [facility]. Auth approved, [id number], five days 9/8-9/12, update 02/10/22, faxed to [phone], for continued stay.    Expected Discharge Plan: [place] Barriers to Discharge: Continued Medical Work up  Expected Discharge Plan and Services Expected Discharge Plan: [place] Choice: [place] arrangements for the past 2 months: Single Family Home                                       Social Determinants of Health (SDOH) Interventions    Readmission Risk Interventions    01/20/2021    2:33 PM 04/03/2020   10:46 AM  Readmission Risk Prevention Plan  Transportation Screening Complete Complete  PCP or Specialist Appt within 3-5 Days Complete   HRI or [place] Complete   Social Work Consult for [place] Planning/Counseling Complete   Palliative Care Screening Not Applicable   Medication Review Press photographer) Complete Complete  HRI or [place]  Complete  SW Recovery Care/Counseling Consult  Complete  [place]  Not Applicable

## 2022-02-06 NOTE — Progress Notes (Signed)
Physical Therapy Treatment Patient Details Name: Jeffrey [name] MRN: [id number] DOB: [birth year] Today's Date: 02/06/2022   History of Present Illness Jeffrey [name] is a 76 y.o. 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   Patient was brought to the ED with reports of weakness over the past few weeks and multiple falls recently.  At the time of my evaluation, patient is alone,  awake, but appears lethargic, slightly drowsy, answers a few questions appropriately, but also appears slightly confused.      I talked to patient's spouse on the phone, she confirms drowsiness, and intermittent confusion over the past few days.  She reports over the past 2 weeks, patient has been having at least 2-3 watery stools every day, so she did not give him his lactulose the past 2 weeks, but he was getting his rifaximin.  At baseline patient has 1-2 bowel movements a day and usually not watery, so and she was concerned for C. difficile infection.  Patient is also taking colchicine for gout, he has been on it for about 6 weeks now.       Patient's HD schedule is Monday Wednesday Friday, he did not get dialyzed today, instead he came to the ED.  Patient denies any pain to me.  Reports a chronic mild cough that is unchanged.  He does make some urine, he denies any urinary symptoms at this time.       Stopped drinking alcohol 2014 per spouse.    PT Comments    Patient requires assist to transition to seated EOB and demonstrates good sitting balance and sitting tolerance. He transfers to standing with use of RW and min assist to transfer to [facility] at bedside. Patient then able to ambulate in room with antalgic gait on L due to c/o hamstring tightness and is limited by fatigue. He then ends session in chair completing seated exercises.  Patient will benefit from continued skilled physical therapy in hospital and recommended venue below to increase strength, balance, endurance for safe ADLs and gait.   Recommendations for follow up therapy are one component of a multi-disciplinary discharge planning process, led by the attending physician.  Recommendations may be updated based on patient status, additional functional criteria and insurance authorization.  Follow Up Recommendations  Skilled nursing-short term rehab (<3 hours/day) Can patient physically be transported by private vehicle: Yes   Assistance Recommended at Discharge Set up Supervision/Assistance  Patient can return home with the following A lot of help with bathing/dressing/bathroom;A lot of help with walking and/or transfers;Help with stairs or ramp for entrance;Assistance with cooking/housework   Equipment Recommendations  None recommended by PT    Recommendations for Other Services       Precautions / Restrictions Precautions Precautions: Fall Restrictions Weight Bearing Restrictions: No     Mobility  Bed Mobility Overal bed mobility: Needs Assistance Bed Mobility: Supine to Sit     Supine to sit: Min assist, Mod assist     General bed mobility comments: increased time, labored movement, had difficulty propping up on elbows to hands due to weakness    Transfers Overall transfer level: Needs assistance Equipment used: Rolling walker (2 wheels) Transfers: Sit to/from Stand, Bed to chair/wheelchair/BSC Sit to Stand: Min assist   Step pivot transfers: Min assist, Mod assist       General transfer comment: labored movement,  increased time    Ambulation/Gait Ambulation/Gait assistance: Min assist Gait Distance (Feet): 15 Feet Assistive device: Rolling walker (2 wheels) Gait Pattern/deviations: Decreased step length - left, Decreased stance time - right, Decreased stride length, Decreased dorsiflexion - right, Decreased dorsiflexion -  left Gait velocity: slow     General Gait Details: slow labored unsteady cadence with antalgic gait on L due to c/o hamstring tightness, fatigues easily   Stairs             Wheelchair Mobility    Modified Rankin (Stroke Patients Only)       Balance Overall balance assessment: Needs assistance Sitting-balance support: Feet supported, No upper extremity supported Sitting balance-Leahy Scale: Fair Sitting balance - Comments: fair/good seated at EOB   Standing balance support: Reliant on assistive device for balance, During functional activity, Bilateral upper extremity supported Standing balance-Leahy Scale: Poor Standing balance comment: fair/poor using RW                            Cognition Arousal/Alertness: Awake/alert Behavior During Therapy: WFL for tasks assessed/performed Overall Cognitive Status: Within Functional Limits for tasks assessed                                          Exercises General Exercises - Lower Extremity Ankle Circles/Pumps: AROM, Both, 10 reps, Seated Long Arc Quad: AROM, Both, 10 reps, Seated Hip Flexion/Marching: AROM, Both, 10 reps, Seated    General Comments        Pertinent Vitals/Pain Pain Assessment Pain Assessment: No/denies pain    Home Living                          Prior Function            PT Goals (current goals can now be found in the care plan section) Acute Rehab PT Goals Patient Stated Goal: return home with family to assist PT Goal Formulation: With patient Time For Goal Achievement: 02/19/22 Potential to Achieve Goals: Good Progress towards PT goals: Progressing toward goals    Frequency    Min 3X/week      PT Plan Current plan remains appropriate    Co-evaluation              AM-PAC PT "6 Clicks" Mobility   Outcome Measure  Help needed turning from your back to your side while in a flat bed without using bedrails?: A Little Help needed  moving from lying on your back to sitting on the side of a flat bed without using bedrails?: A Little Help needed moving to and from a bed to a chair (including a wheelchair)?: A Lot Help needed standing up from a chair using your arms (e.g., wheelchair or bedside chair)?: A Lot Help needed to walk in hospital room?: A Lot Help needed climbing 3-5 steps with a railing? : A Lot 6 Click Score: 14    End of Session Equipment Utilized During Treatment: Oxygen Activity Tolerance: Patient tolerated treatment well;Patient limited by fatigue Patient left: in chair;with call bell/phone within reach;with nursing/sitter in room Nurse Communication: Mobility status PT Visit Diagnosis: Unsteadiness on feet (R26.81);Other abnormalities of gait and mobility (R26.89);Muscle weakness (generalized) (M62.81)     Time: 2774-1287 PT Time Calculation (min) (ACUTE ONLY): 19 min  Charges:  $Therapeutic Activity: 8-22 mins  10:37 AM, 02/06/22 Mearl Latin PT, DPT Physical Therapist at [facility]

## 2022-02-06 NOTE — Care Management Important Message (Signed)
Important Message  Patient Details  Name: Jeffrey [name] MRN: [id number] Date of Birth: [birth year]   Medicare Important Message Given:  Yes (spoke with wife [name] at [phone], no additional copy needed)     [name] 02/06/2022, 2:08 PM

## 2022-02-06 NOTE — Telephone Encounter (Signed)
Please schedule hospital follow up in 3-4 weeks. Dr. Gala Romney or me.

## 2022-02-06 NOTE — Brief Op Note (Signed)
02/02/2022 - 02/06/2022  11:42 AM  PATIENT:  Jeffrey [name]  76 y.o. male  PRE-OPERATIVE DIAGNOSIS:  acute on chronic anemia  POST-OPERATIVE DIAGNOSIS:  portal hypertensive gastropathy, spontaneous oozing of blood from gastric body, 1 clip placed  PROCEDURE:  Procedure(s) with comments: ESOPHAGOGASTRODUODENOSCOPY (EGD) WITH PROPOFOL (N/A) HOT HEMOSTASIS (ARGON PLASMA COAGULATION/BICAP) HEMOSTASIS CLIP PLACEMENT - gastric body  SURGEON:  Surgeon(s) and Role:    * Harvel Quale, MD - Primary  Patient underwent EGD under propofol sedation.  Tolerated the procedure adequately. Grade I varices were found in the lower third of the esophagus. These had slight purplish discoloration but the varices were very flat. There were scars from previous banding. Moderate portal hypertensive gastropathy was found in the entire examined stomach. Red blood was found on the posterior wall of the gastric body. This was slowly oozing from the gastric wall (due to portal HTN gastropathy vs AVM?). Coagulation for hemostasis using argon plasma at 0.3 liters/minute and 20 watts was successful.  For hemostasis, one hemostatic clip was successfully placed.  There was no bleeding at the end of the procedure.  Patchy moderate inflammation characterized by congestion (edema), erythema and granularity was found in the first portion of the duodenum.   RECOMMENDATIONS - Discharge patient to home (ambulatory).  - Resume previous diet.  - Pantoprazole 40 mg twice a day. - Follow up in GI clinic in 3-4 weeks. - GI service will sign-off, please call us back if you have any more questions.  Maylon Peppers, MD Gastroenterology and Hepatology [facility]

## 2022-02-06 NOTE — Progress Notes (Signed)
Patient ID: Jeffrey [name], male   DOB: 03-14-1946, 76 y.o.   MRN: [id number] S: No acute events, no complaints. Open for EGD today O:BP (!) 176/81   Pulse 85   Temp 98.3 F (36.8 C) (Oral)   Resp 18   Ht '5\' 5"'$  (1.651 m)   Wt 75 kg   SpO2 100%   BMI 27.51 kg/m   Intake/Output Summary (Last 24 hours) at 02/06/2022 1054 Last data filed at 02/06/2022 0700 Gross per 24 hour  Intake 460.12 ml  Output 2000 ml  Net -1539.88 ml   Intake/Output: I/O last 3 completed shifts: In: 940.1 [P.O.:840; IV Piggyback:100.1] Out: 2250 [Urine:250; Other:2000]  Intake/Output this shift:  No intake/output data recorded. Weight change:  Gen: NAD, sitting up in chair CVS: RRR Resp: CTA Abd: +BS, soft, NT Ext: no edema, LUE AVF +T/B Neuro: awake, alert  Recent Labs  Lab 02/02/22 1327 02/03/22 0333 02/04/22 0250 02/04/22 0301 02/05/22 0306 02/06/22 0555  NA 133* 133*  --  133* 132* 132*  K 4.2 4.2  --  3.0* 3.1* 3.0*  CL 96* 99  --  97* 96* 94*  CO2 25 25  --  '26 26 29  '$ GLUCOSE 158* 110*  --  166* 231* 232*  BUN 34* 39*  --  20 29* 22  CREATININE 4.43* 4.96*  --  2.92* 4.07* 3.29*  ALBUMIN 2.8*  --  2.4* 2.4* 2.5* 2.5*  CALCIUM 9.2 9.0  --  8.0* 8.2* 8.2*  PHOS  --   --   --  3.0 4.6 3.4  AST 46*  --  37  --   --   --   ALT 32  --  25  --   --   --    Liver Function Tests: Recent Labs  Lab 02/02/22 1327 02/04/22 0250 02/04/22 0301 02/05/22 0306 02/06/22 0555  AST 46* 37  --   --   --   ALT 32 25  --   --   --   ALKPHOS 144* 122  --   --   --   BILITOT 1.2 1.4*  --   --   --   PROT 6.6 5.9*  --   --   --   ALBUMIN 2.8* 2.4* 2.4* 2.5* 2.5*   No results for input(s): "LIPASE", "AMYLASE" in the last 168 hours. Recent Labs  Lab 02/03/22 0806 02/04/22 0301 02/05/22 0306  AMMONIA 64* 74* 35   CBC: Recent Labs  Lab 02/02/22 1327 02/03/22 0333 02/03/22 1929 02/04/22 0301 02/06/22 0555  WBC 3.5* 2.9*  --  2.5* 2.6*  NEUTROABS 3.0  --   --   --   --   HGB 7.1* 6.4* 8.7* 8.8*  9.9*  HCT 22.9* 20.7* 27.0* 27.5* 30.8*  MCV 106.5* 107.3*  --  98.2 98.4  PLT 111* 89*  --  85* 61*   Cardiac Enzymes: No results for input(s): "CKTOTAL", "CKMB", "CKMBINDEX", "TROPONINI" in the last 168 hours. CBG: Recent Labs  Lab 02/04/22 2121 02/05/22 0722 02/05/22 1607 02/05/22 2206 02/06/22 0705  GLUCAP 197* 239* 188* 266* 228*    Iron Studies: No results for input(s): "IRON", "TIBC", "TRANSFERRIN", "FERRITIN" in the last 72 hours. Studies/Results: No results found.   [MAR Hold] sodium chloride   Intravenous Once   [MAR Hold] sodium chloride   Intravenous Once   [MAR Hold] allopurinol  300 mg Oral Daily   [MAR Hold] Chlorhexidine Gluconate Cloth  6 each Topical Q0600   [  MAR Hold] diltiazem  120 mg Oral Daily   [MAR Hold] famotidine  20 mg Oral Daily   [MAR Hold] insulin aspart  0-5 Units Subcutaneous QHS   [MAR Hold] insulin aspart  0-9 Units Subcutaneous TID WC   [MAR Hold] lactulose  10 g Oral BID   [MAR Hold] loratadine  10 mg Oral Daily   [MAR Hold] metoprolol succinate  25 mg Oral QHS   [MAR Hold] metoprolol succinate  25 mg Oral Once per day on Sun Mon Wed Fri   [facility] Hold] rifaximin  550 mg Oral BID    BMET    Component Value Date/Time   NA 132 (L) 02/06/2022 0555   K 3.0 (L) 02/06/2022 0555   CL 94 (L) 02/06/2022 0555   CO2 29 02/06/2022 0555   GLUCOSE 232 (H) 02/06/2022 0555   BUN 22 02/06/2022 0555   CREATININE 3.29 (H) 02/06/2022 0555   CREATININE 2.65 (H) 11/16/2019 1203   CALCIUM 8.2 (L) 02/06/2022 0555   CALCIUM 8.3 (L) 09/19/2020 1024   GFRNONAA 19 (L) 02/06/2022 0555   GFRNONAA 23 (L) 11/16/2019 1203   GFRAA 15 (L) 02/20/2020 0636   GFRAA 27 (L) 11/16/2019 1203   CBC    Component Value Date/Time   WBC 2.6 (L) 02/06/2022 0555   RBC 3.13 (L) 02/06/2022 0555   HGB 9.9 (L) 02/06/2022 0555   HCT 30.8 (L) 02/06/2022 0555   HCT 28 02/25/2012 1034   PLT 61 (L) 02/06/2022 0555   PLT 125 02/25/2012 1034   MCV 98.4 02/06/2022 0555   MCV  82.0 02/25/2012 1034   MCH 31.6 02/06/2022 0555   MCHC 32.1 02/06/2022 0555   RDW 22.5 (H) 02/06/2022 0555   LYMPHSABS 0.2 (L) 02/02/2022 1327   MONOABS 0.2 02/02/2022 1327   EOSABS 0.0 02/02/2022 1327   BASOSABS 0.0 02/02/2022 1327    Dialysis Orders: Center: [facility]  on MWF . EDW 78.5 kg HD Bath 2K/2.5Ca  Time 4 hours Heparin none. Access LUE AVF BFR 350  DFR 500    Micera 200 mcg IV every 2 weeks  Venofer  50 mg IV weekly   Assessment/Plan:  Acute on chronic anemia - due to chronic blood loss.  Improved with blood transfusion 02/03/22. Hgb 9.9 today. 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 Mentation seems to have improved over the last couple of days  ESRD -  Normally MWF but off schedule.  HD today, next treatment planned for Saturday. Can get back on MWF schedule this coming Monday  Hypertension/volume  -  stable  Anemia  - as above  Metabolic bone disease -   continue with home meds  Nutrition -  renal diet  Weakness and falls - likely due to anemia but will need PT/OT evaluation.   [name], MD [facility]

## 2022-02-06 NOTE — Anesthesia Postprocedure Evaluation (Signed)
Anesthesia Post Note  Patient: Jeffrey [name]  Procedure(s) Performed: ESOPHAGOGASTRODUODENOSCOPY (EGD) WITH PROPOFOL HOT HEMOSTASIS (ARGON PLASMA COAGULATION/BICAP) HEMOSTASIS CLIP PLACEMENT  Patient location during evaluation: PACU Anesthesia Type: General Level of consciousness: awake and alert and oriented Pain management: pain level controlled Vital Signs Assessment: post-procedure vital signs reviewed and stable Respiratory status: spontaneous breathing, nonlabored ventilation and respiratory function stable Cardiovascular status: blood pressure returned to baseline and stable Postop Assessment: no apparent nausea or vomiting Anesthetic complications: no   No notable events documented.   Last Vitals:  Vitals:   02/06/22 1138 02/06/22 1300  BP: (!) 129/54 (!) 166/91  Pulse: 85 87  Resp: (!) 24 17  Temp: 36.4 C 36.9 C  SpO2: 100% 100%    Last Pain:  Vitals:   02/06/22 1300  TempSrc: Oral  PainSc:                  Will Schier C Teoman Giraud

## 2022-02-06 NOTE — Inpatient Diabetes Management (Signed)
Inpatient Diabetes Program Recommendations  AACE/ADA: New Consensus Statement on Inpatient Glycemic Control (2015)  Target Ranges:  Prepandial:   less than 140 mg/dL      Peak postprandial:   less than 180 mg/dL (1-2 hours)      Critically ill patients:  140 - 180 mg/dL   Lab Results  Component Value Date   GLUCAP 228 (H) 02/06/2022   HGBA1C 5.5 07/29/2021    Review of Glycemic Control  Latest Reference Range & Units 02/05/22 07:22 02/05/22 16:07 02/05/22 22:06 02/06/22 07:05  Glucose-Capillary 70 - 99 mg/dL 239 (H) 188 (H) 266 (H) 228 (H)   Diabetes history: DM 2 Outpatient Diabetes medications: Humalog 10-50 units tid SSI, Tresiba 120 units Current orders for Inpatient glycemic control:  Novolog 0-9 units tid + hs Renal function elevated but improving A1c 5.5% on 2/28  Inpatient Diabetes Program Recommendations:    -  Consider Levemir 5 units  Thanks,  Tama Headings RN, MSN, BC-ADM Inpatient Diabetes Coordinator Team Pager [phone] (8a-5p)

## 2022-02-06 NOTE — Progress Notes (Signed)
Patient briefly seen to discuss plans for esophagogastroduodenoscopy today. He did not recall talking with Dr. Jenetta Downer about it yesterday. Explained purpose of procedure today, risk and benefits. He is agreeable. He is alert and oriented. No asterixis. Requesting potassium repletion per attending (in setting of ESRD).   Laureen Ochs. Bernarda Caffey [facility] [phone] 9/8/20238:37 AM

## 2022-02-06 NOTE — Assessment & Plan Note (Signed)
Presented with Hgb 6.4 Baseline Hgb 8-9 Due to portal HTN gastropathy vs gastric AVM 9/8 EGD-- G1 esoph varices; portal HTN gastropathy; red blood in posterior gastric body  tx with APC/hemoclip Transfused 2 units this admit Hgb stable since transfusion

## 2022-02-06 NOTE — Op Note (Signed)
[facility] Patient Name: Jeffrey [name] Procedure Date: 02/06/2022 10:59 AM MRN: [id number] Date of Birth: [birth year] Attending MD: Maylon Peppers ,  CSN: [id number] Age: 76 Admit Type: Inpatient Procedure:                Upper GI endoscopy Indications:              Iron deficiency anemia Providers:                Maylon Peppers, Caprice Kluver, Randa Spike,                            Technician Referring MD:              Medicines:                Monitored Anesthesia Care Complications:            No immediate complications. Estimated Blood Loss:     Estimated blood loss: none. Procedure:                Pre-Anesthesia Assessment:                           - Prior to the procedure, a History and Physical                            was performed, and patient medications, allergies                            and sensitivities were reviewed. The patient's                            tolerance of previous anesthesia was reviewed.                           - The risks and benefits of the procedure and the                            sedation options and risks were discussed with the                            patient. All questions were answered and informed                            consent was obtained.                           - ASA Grade Assessment: III - A patient with severe                            systemic disease.                           After obtaining informed consent, the endoscope was                            passed under direct vision. Throughout the  procedure, the patient's blood pressure, pulse, and                            oxygen saturations were monitored continuously. The                            [id number]-H190 ([id number]) scope was introduced through the                            mouth, and advanced to the second part of duodenum.                            The upper GI endoscopy was accomplished without                             difficulty. The patient tolerated the procedure                            well. Scope In: 11:21:59 AM Scope Out: 11:33:44 AM Total Procedure Duration: 0 hours 11 minutes 45 seconds  Findings:      Grade I varices were found in the lower third of the esophagus. These       had slight purplish discoloration but the varices were very flat. There       were scars from previous banding.      Moderate portal hypertensive gastropathy was found in the entire       examined stomach.      Red blood was found on the posterior wall of the gastric body. This was       slowly oozing from the gastric wall (due to portal HTN gastropathy vs       AVM?). Coagulation for hemostasis using argon plasma at 0.3       liters/minute and 20 watts was successful. For hemostasis, one       hemostatic clip was successfully placed. There was no bleeding at the       end of the procedure.      Patchy moderate inflammation characterized by congestion (edema),       erythema and granularity was found in the first portion of the duodenum.      The exam of the duodenum was otherwise normal. Impression:               - Grade I esophageal varices.                           - Portal hypertensive gastropathy.                           - Red blood in the gastric body (posterior wall),                            possibly from AVM vs portal HTN gastropathy.                            Treated with argon plasma coagulation (APC). Clip  was placed.                           - Duodenitis.                           - No specimens collected. Moderate Sedation:      Per Anesthesia Care Recommendation:           - Discharge patient to home (ambulatory).                           - Resume previous diet.                           - Pantoprazole 40 mg twice a day.                           - Follow up in GI clinic in 3-4 weeks. Procedure Code(s):        --- Professional ---                           [phone],  Esophagogastroduodenoscopy, flexible,                            transoral; with control of bleeding, any method Diagnosis Code(s):        --- Professional ---                           I85.00, Esophageal varices without bleeding                           K76.6, Portal hypertension                           K31.89, Other diseases of stomach and duodenum                           K92.2, Gastrointestinal hemorrhage, unspecified                           K29.80, Duodenitis without bleeding                           D50.9, Iron deficiency anemia, unspecified CPT copyright 2019 [employer]. All rights reserved. The codes documented in this report are preliminary and upon coder review may  be revised to meet current compliance requirements. Maylon Peppers, MD Maylon Peppers,  02/06/2022 11:41:49 AM This report has been signed electronically. Number of Addenda: 0

## 2022-02-06 NOTE — Transfer of Care (Signed)
Immediate Anesthesia Transfer of Care Note  Patient: Jeffrey [name]  Procedure(s) Performed: ESOPHAGOGASTRODUODENOSCOPY (EGD) WITH PROPOFOL HOT HEMOSTASIS (ARGON PLASMA COAGULATION/BICAP) HEMOSTASIS CLIP PLACEMENT  Patient Location: PACU  Anesthesia Type:General  Level of Consciousness: drowsy  Airway & Oxygen Therapy: Patient Spontanous Breathing and Patient connected to nasal cannula oxygen  Post-op Assessment: Report given to RN and Post -op Vital signs reviewed and stable  Post vital signs: Reviewed and stable  Last Vitals:  Vitals Value Taken Time  BP 129/54 02/06/22 1138  Temp 36.4 C 02/06/22 1138  Pulse 85 02/06/22 1138  Resp 24 02/06/22 1138  SpO2 100 % 02/06/22 1138    Last Pain:  Vitals:   02/06/22 1118  TempSrc:   PainSc: 4          Complications: No notable events documented.

## 2022-02-06 NOTE — Anesthesia Preprocedure Evaluation (Signed)
Anesthesia Evaluation  Patient identified by MRN, date of birth, ID band Patient awake    Reviewed: Allergy & Precautions, NPO status , Patient's Chart, lab work & pertinent test results  Airway Mallampati: II  TM Distance: >3 FB Neck ROM: Full    Dental  (+) Edentulous Upper, Edentulous Lower   Pulmonary shortness of breath, with exertion and Long-Term Oxygen Therapy, pneumonia, former smoker,    Pulmonary exam normal breath sounds clear to auscultation       Cardiovascular Exercise Tolerance: Poor hypertension, Pt. on medications Normal cardiovascular exam+ dysrhythmias Atrial Fibrillation  Rhythm:Regular Rate:Normal     Neuro/Psych PSYCHIATRIC DISORDERS Anxiety Depression negative neurological ROS     GI/Hepatic PUD, GERD  Medicated and Controlled,(+) Cirrhosis   Esophageal Varices and ascites  substance abuse  alcohol use,   Endo/Other  diabetes  Renal/GU Dialysis and ESRFRenal disease  negative genitourinary   Musculoskeletal  (+) Arthritis , Osteoarthritis,    Abdominal   Peds negative pediatric ROS (+)  Hematology  (+) Blood dyscrasia, anemia ,   Anesthesia Other Findings   Reproductive/Obstetrics negative OB ROS                            Anesthesia Physical Anesthesia Plan  ASA: 4  Anesthesia Plan: General   Post-op Pain Management: Minimal or no pain anticipated   Induction: Intravenous  PONV Risk Score and Plan: Propofol infusion  Airway Management Planned: Nasal Cannula and Natural Airway  Additional Equipment:   Intra-op Plan:   Post-operative Plan: Possible Post-op intubation/ventilation  Informed Consent: I have reviewed the patients History and Physical, chart, labs and discussed the procedure including the risks, benefits and alternatives for the proposed anesthesia with the patient or authorized representative who has indicated his/her understanding and  acceptance.     Dental advisory given  Plan Discussed with: CRNA and Surgeon  Anesthesia Plan Comments:         Anesthesia Quick Evaluation

## 2022-02-07 DIAGNOSIS — D62 Acute posthemorrhagic anemia: Secondary | ICD-10-CM | POA: Diagnosis not present

## 2022-02-07 DIAGNOSIS — E1122 Type 2 diabetes mellitus with diabetic chronic kidney disease: Secondary | ICD-10-CM

## 2022-02-07 DIAGNOSIS — Z794 Long term (current) use of insulin: Secondary | ICD-10-CM

## 2022-02-07 DIAGNOSIS — K7682 Hepatic encephalopathy: Secondary | ICD-10-CM | POA: Diagnosis not present

## 2022-02-07 DIAGNOSIS — G9341 Metabolic encephalopathy: Secondary | ICD-10-CM | POA: Diagnosis not present

## 2022-02-07 LAB — GLUCOSE, CAPILLARY
Glucose-Capillary: 208 mg/dL — ABNORMAL HIGH (ref 70–99)
Glucose-Capillary: 260 mg/dL — ABNORMAL HIGH (ref 70–99)

## 2022-02-07 LAB — RENAL FUNCTION PANEL
Albumin: 2.6 g/dL — ABNORMAL LOW (ref 3.5–5.0)
Anion gap: 9 (ref 5–15)
BUN: 35 mg/dL — ABNORMAL HIGH (ref 8–23)
CO2: 26 mmol/L (ref 22–32)
Calcium: 8.2 mg/dL — ABNORMAL LOW (ref 8.9–10.3)
Chloride: 94 mmol/L — ABNORMAL LOW (ref 98–111)
Creatinine, Ser: 4.31 mg/dL — ABNORMAL HIGH (ref 0.61–1.24)
GFR, Estimated: 14 mL/min — ABNORMAL LOW (ref >60)
Glucose, Bld: 239 mg/dL — ABNORMAL HIGH (ref 70–99)
Phosphorus: 4.5 mg/dL (ref 2.5–4.6)
Potassium: 3.7 mmol/L (ref 3.5–5.1)
Sodium: 129 mmol/L — ABNORMAL LOW (ref 135–145)

## 2022-02-07 MED ORDER — FOSFOMYCIN TROMETHAMINE 3 G PO PACK: 3.0000 g | PACK | Freq: Once | ORAL | Status: DC

## 2022-02-07 MED ORDER — ALPRAZOLAM 0.5 MG PO TABS: 0.5000 mg | ORAL_TABLET | ORAL | 0 refills | Status: AC

## 2022-02-07 MED ORDER — SODIUM CHLORIDE 0.9 % IV SOLN: 1.0000 g | Freq: Two times a day (BID) | INTRAVENOUS | Status: DC

## 2022-02-07 MED ORDER — LACTULOSE 10 GM/15ML PO SOLN: 10.0000 g | Freq: Two times a day (BID) | ORAL | 0 refills | Status: AC

## 2022-02-07 MED ORDER — FOSFOMYCIN TROMETHAMINE 3 G PO PACK: 3.0000 g | PACK | Freq: Once | ORAL | 0 refills | Status: DC

## 2022-02-07 MED ORDER — SODIUM CHLORIDE 0.9 % IV SOLN
1.0000 g | INTRAVENOUS | Status: DC
  Administered 2022-02-07: 1 g via INTRAVENOUS
  Filled 2022-02-07: qty 20

## 2022-02-07 MED ORDER — VANCOMYCIN HCL 750 MG/150ML IV SOLN
750.0000 mg | Freq: Once | INTRAVENOUS | Status: AC
Start: 2022-02-07 — End: 2022-02-07
  Administered 2022-02-07: 750 mg via INTRAVENOUS
  Filled 2022-02-07: qty 150

## 2022-02-07 MED ORDER — FOSFOMYCIN TROMETHAMINE 3 G PO PACK
3.0000 g | PACK | Freq: Once | ORAL | Status: DC
Start: 2022-02-08 — End: 2022-02-07

## 2022-02-07 MED ORDER — FOSFOMYCIN TROMETHAMINE 3 G PO PACK
3.0000 g | PACK | Freq: Once | ORAL | 0 refills | Status: AC
Start: 2022-02-08 — End: 2022-02-08

## 2022-02-07 NOTE — Discharge Summary (Signed)
Physician Discharge Summary   Patient: Jeffrey [name] MRN: [id number] DOB: [birth year]  Admit date:     02/02/2022  Discharge date: 02/07/22  Discharge Physician: Shanon Brow Kennley Schwandt   PCP: Monico Blitz, MD   Recommendations at discharge:   Please follow up with primary care provider within 1-2 weeks  Please repeat BMP and CBC in one week    Hospital Course: 76 y.o. 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  Patient was brought to the ED with reports of weakness over the past few weeks and multiple falls recently.  At the time of my evaluation, patient is alone,  awake, but appears lethargic, slightly drowsy, answers a few questions appropriately, but also appears slightly confused.    I talked to patient's spouse on the phone, she confirms drowsiness, and intermittent confusion over the past few days.  She reports over the past 2 weeks, patient has been having at least 2-3 watery stools every day, so she did not give him his lactulose the past 2 weeks, but he was getting his rifaximin.  At baseline patient has 1-2 bowel movements a day and usually not watery, so and she was concerned for C. difficile infection.  Patient is also taking colchicine for gout, he has been on it for about 6 weeks now.     Patient's HD schedule is Monday Wednesday Friday, he did not get dialyzed today, instead he came to the ED.  Patient denies any pain to me.  Reports a chronic mild cough that is unchanged.  He does make some urine, he denies any urinary symptoms at this time.     Stopped drinking alcohol 2014 per spouse.   ED Course: Tmax 98.8.  Heart rate 69-75.  Respiratory rate 12-20.  Blood pressure systolic 086-761.  O2 sats greater than 93% on 3 L. Ammonia level 65. Hemoglobin 7.1. UA with large leukocytes many  bacteria greater than 50 WBCs. 2 view chest x-ray-gross cardiomegaly with pulmonary vascular prominence and layering bilateral pleural effusions similar to prior exam. Zosyn started for UTI.  Hospitalist to admit.  Assessment and Plan: * Acute metabolic encephalopathy Multifactorial including UTI, hyperammonemia, ABLA -continue merrem -transfused 2 units PRBC -continue lactulose Mental status is now back to baseline  Acute hepatic encephalopathy [facility]) He presented lethargic with generalized weakness.  Also reports of multiple falls.  Ammonia level elevated at 65>>35 -Lactulose ordered and rifaximin. 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  Falls weakness, spouse reports patient did not hit his head. -CT head no acute findings -PT eval>>SNF  ESRD on dialysis [facility]) Schedule Monday Wednesday Friday.  Missed HD today.  Chest X-ray with stable layering bilateral effusions.  -nephrology consultation appreciated -HD on 02/07/22 prior to d/c  Generalized weakness --suspect acutely exacerbated by anemia --Hg down to 6.4 --transfused 2 units PRBC --PT eval>>SNF  Iron deficiency anemia due to chronic blood loss Hemoglobin 6.4, baseline ~8. -transfuse 2 units PRBC with hemodialysis - hold subcutaneous  heparin due to drop in Hg -05/22/21 EGD--portal HTN gastropathy, denuded esophageal mucosa -01/19/21 colonscopy--large non bleeding rectal varices; polyp prox desc colon, inflammatory polyp splenic flexure  Controlled type 2 diabetes mellitus with chronic kidney disease on chronic dialysis, with long-term current use of insulin ([place]) Spouse related that she dispenses Tresiba prn when sugars are >250 --also states he had been prone to hypoglycemic events --07/29/21 A1C--5.5 --discussed with spouse that for the short term, will d/c to SNF with just short acting sliding scale insulin and she may resume patient's prior regimen after d/c from SNF   Alcoholic cirrhosis ([place]) -- will need to follow up with GI outpatient for ongoing management   Essential hypertension, benign Stable. -Resume diltiazem, metoprolol, - Hold Lasix 80 mg daily  Diarrhea in adult patient 2-3 episodes of watery stools over the past 2 weeks.  So has not taken Constulose in 2 weeks also.  History of C. difficile infection.  Afebrile.  Stable mild leukopenia, no abdominal pain.  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 Overall stable except Hgb Trend CBC         Consultants: renal Procedures performed: none  Disposition: Skilled nursing facility Diet recommendation:  Renal diet DISCHARGE MEDICATION: Allergies as of 02/07/2022       Reactions   Tape Itching, Rash, Other (See Comments)   Tears skin        Medication List     STOP taking these medications    colchicine 0.6 MG tablet   gabapentin 100 MG capsule Commonly known as: NEURONTIN   oxyCODONE-acetaminophen 5-325 MG tablet Commonly known as: PERCOCET/ROXICET   spironolactone 50 MG tablet Commonly known as: ALDACTONE   sulfamethoxazole-trimethoprim 800-160  MG tablet Commonly known as: BACTRIM DS   tiZANidine 2 MG tablet Commonly known as: ZANAFLEX   Tresiba FlexTouch 200 UNIT/ML FlexTouch Pen Generic drug: insulin degludec       TAKE these medications    acetaminophen 650 MG CR tablet Commonly known as: TYLENOL Take 650 mg by mouth every 8 (eight) hours as needed for pain.   Alcohol Prep 70 % Pads Apply 1 each topically 3 (three) times daily.   Alka-Seltzer Heartburn + Gas 750-80 MG Chew Generic drug: Calcium Carbonate-Simethicone Chew 1-2 tablets by mouth 2 (two) times daily as needed (acid reflux).   allopurinol 300 MG tablet Commonly known as: ZYLOPRIM Take 300 mg by mouth daily.   ALPRAZolam 0.5 MG tablet Commonly known as: XANAX Take 1 tablet (0.5 mg total) by mouth See admin instructions. Take one tablet (0.5 mg) by mouth on Monday, Wednesday, Friday before dialysis, may also take one tablet (0.5 mg) daily as needed for anxiety.   BD Pen Needle Micro U/F 32G X 6 MM Misc Generic drug: Insulin Pen Needle SMARTSIG:SUB-Q Daily PRN   calcium carbonate 500 MG chewable tablet Commonly known as: TUMS - dosed in mg elemental calcium Chew 1 tablet by mouth 3 (three) times daily with meals.   cetirizine 10 MG tablet Commonly known as: ZYRTEC Take 10 mg by mouth daily.   cholecalciferol 25 MCG (1000 UNIT) tablet  Commonly known as: VITAMIN D3 Take 1,000 Units by mouth daily.   diltiazem 120 MG 24 hr capsule Commonly known as: DILACOR XR Take 120 mg by mouth daily.   famotidine 20 MG tablet Commonly known as: Pepcid Take 1 tablet (20 mg total) by mouth 2 (two) times daily.   fosfomycin 3 g Pack Commonly known as: MONUROL Take 3 g by mouth once for 1 dose. Start taking on: February 08, 2022   FreeStyle Libre 2 Sensor Misc Apply topically.   furosemide 40 MG tablet Commonly known as: LASIX Take 80 mg by mouth daily.   Garlic 0354 MG Caps Take 1,000 mg by mouth daily.   GlucaGen HypoKit 1 MG Solr  injection Generic drug: glucagon Inject 1 mg into the muscle once as needed for low blood sugar.   HumaLOG KwikPen 100 UNIT/ML KwikPen Generic drug: insulin lispro 10-50 Units 3 (three) times daily. Sliding scale   hydrOXYzine 25 MG tablet Commonly known as: ATARAX Take 25 mg by mouth 3 (three) times daily. For itching   iron polysaccharides 150 MG capsule Commonly known as: NIFEREX Take 150 mg by mouth daily with supper.   lactulose 10 GM/15ML solution Commonly known as: CHRONULAC Take 15 mLs (10 g total) by mouth 2 (two) times daily. What changed:  how much to take when to take this   metoprolol succinate 25 MG 24 hr tablet Commonly known as: TOPROL-XL Take 1 tablet (25 mg total) by mouth See admin instructions. Take one tablet (25 mg) by mouth at bedtime every night and take one tablet (25 mg) in morning on  Sun, Tues, Thurs and Sat (non-dialysis days)   Milk Thistle 250 MG Caps Take 250 mg by mouth in the morning, at noon, and at bedtime.   mirtazapine 30 MG tablet Commonly known as: REMERON Take 1 tablet (30 mg total) by mouth at bedtime. What changed: when to take this   multivitamin with minerals Tabs tablet Take 1 tablet by mouth in the morning.   omega-3 acid ethyl esters 1 g capsule Commonly known as: LOVAZA Take 2 g by mouth 2 (two) times daily.   ondansetron 4 MG tablet Commonly known as: ZOFRAN Take 4 mg by mouth every 8 (eight) hours as needed for nausea or vomiting.   pravastatin 20 MG tablet Commonly known as: PRAVACHOL Take 20 mg by mouth in the morning.   PROBIOTIC DAILY PO Take 1 tablet by mouth in the morning.   thiamine 100 MG tablet Commonly known as: Vitamin B-1 Take 100 mg by mouth daily.   TRI-VIT/FLUORIDE/IRON PO Take 1 tablet by mouth daily.   triamcinolone cream 0.1 % Commonly known as: KENALOG Apply 1 application topically 2 (two) times daily as needed (dry skin Itching).   venlafaxine 75 MG tablet Commonly known as:  EFFEXOR Take 75 mg by mouth 3 (three) times daily with meals.   vitamin C 1000 MG tablet Take 1,000 mg by mouth in the morning.   Xifaxan 550 MG Tabs tablet Generic drug: rifaximin TAKE 1 TABLET BY MOUTH TWICE DAILY What changed: how much to take        Contact information for after-discharge care     [facility]   Service: Skilled Nursing Contact information: [address] [phone]                    Discharge Exam: Danley Danker Weights  02/05/22 1305 02/05/22 1459 02/07/22 1051  Weight: 76.9 kg 75 kg 74.9 kg   HEENT:  [place]/AT, No thrush, no icterus CV:  RRR, no rub, no S3, no S4 Lung:  fine bibasilar rales. No wheeze Abd:  soft/+BS, NT Ext:  No edema, no lymphangitis, no synovitis, no rash   Condition at discharge: stable  The results of significant diagnostics from this hospitalization (including imaging, microbiology, ancillary and laboratory) are listed below for reference.   Imaging Studies: CT HEAD WO CONTRAST (5MM)  Result Date: 02/03/2022 CLINICAL DATA:  Initial evaluation for encephalopathy, multiple falls. EXAM: CT HEAD WITHOUT CONTRAST TECHNIQUE: Contiguous axial images were obtained from the base of the skull through the vertex without intravenous contrast. RADIATION DOSE REDUCTION: This exam was performed according to the departmental dose-optimization program which includes automated exposure control, adjustment of the mA and/or kV according to patient size and/or use of iterative reconstruction technique. COMPARISON:  Prior CT from 04/01/2020. FINDINGS: Brain: Age-related cerebral atrophy with chronic small vessel ischemic disease. Small remote left cerebellar infarct noted, stable. No acute intracranial hemorrhage. No acute large vessel territory infarct. No mass lesion, midline shift or mass effect. No hydrocephalus or extra-axial fluid collection.  Vascular: No abnormal hyperdense vessel. Scattered vascular calcifications noted within the carotid siphons. Skull: Scalp soft tissues demonstrate no acute finding. Calvarium intact. Sinuses/Orbits: Globes orbital soft tissues within normal limits. Paranasal sinuses and mastoid air cells are clear. Other: None. IMPRESSION: 1. No acute intracranial abnormality. 2. Age-related cerebral atrophy with chronic small vessel ischemic disease, with a small remote left cerebellar infarct. Electronically Signed   By: Jeannine Boga M.D.   On: 02/03/2022 01:49   DG Chest 2 View  Result Date: 02/02/2022 CLINICAL DATA:  Dialysis, missed dialysis, weakness, multiple falls EXAM: CHEST - 2 VIEW COMPARISON:  08/10/2021 FINDINGS: Gross cardiomegaly. Layering bilateral pleural effusions, similar to prior examination. Pulmonary vascular prominence. Osseous structures unremarkable. IMPRESSION: Gross cardiomegaly with pulmonary vascular prominence and layering bilateral pleural effusions, similar to prior examination. Electronically Signed   By: Delanna Ahmadi M.D.   On: 02/02/2022 15:44   US Paracentesis  Result Date: 01/15/2022 INDICATION: Recurrent ascites, cirrhosis, last para 11/20/21 EXAM: ULTRASOUND GUIDED  PARACENTESIS MEDICATIONS: None. COMPLICATIONS: None immediate. PROCEDURE: Informed written consent was obtained from the patient after a discussion of the risks, benefits and alternatives to treatment. A timeout was performed prior to the initiation of the procedure. Initial ultrasound scanning demonstrates a moderate amount of ascites within the left lower abdominal quadrant. The left lower abdomen was prepped and draped in the usual sterile fashion. 1% lidocaine was used for local anesthesia. Following this, a 19 gauge, 7-cm, Yueh catheter was introduced. An ultrasound image was saved for documentation purposes. The paracentesis was performed. The catheter was removed and a dressing was applied. The patient  tolerated the procedure well without immediate post procedural complication. FINDINGS: A total of approximately 800cc of turbid fluid was removed. Samples were sent to the laboratory as requested by the clinical team. IMPRESSION: Successful ultrasound-guided paracentesis yielding 0.8 liters of peritoneal fluid. Electronically Signed   By: Lavonia Dana M.D.   On: 01/15/2022 13:07    Microbiology: Results for orders placed or performed during the hospital encounter of 02/02/22  Urine Culture     Status: Abnormal   Collection Time: 02/02/22  3:14 PM   Specimen: Urine, Catheterized  Result Value Ref Range Status   Specimen Description   Final    URINE, CATHETERIZED Performed at [facility]  [facility], [address], [place], [place] [address]    Special Requests   Final    NONE Performed at [facility], [address] [place], [place] [address]    Culture (A)  Final    >=100,000 COLONIES/mL ESCHERICHIA COLI Confirmed Extended Spectrum Beta-Lactamase Producer (ESBL).  In bloodstream infections from ESBL organisms, carbapenems are preferred over piperacillin/tazobactam. They are shown to have a lower risk of mortality. 60,000 COLONIES/mL ENTEROCOCCUS FAECIUM    Report Status 02/05/2022 FINAL  Final   Organism ID, Bacteria ESCHERICHIA COLI (A)  Final   Organism ID, Bacteria ENTEROCOCCUS FAECIUM (A)  Final      Susceptibility   Escherichia coli - MIC*    AMPICILLIN >=32 RESISTANT Resistant     CEFAZOLIN >=64 RESISTANT Resistant     CEFEPIME 4 INTERMEDIATE Intermediate     CEFTRIAXONE >=64 RESISTANT Resistant     CIPROFLOXACIN >=4 RESISTANT Resistant     GENTAMICIN <=1 SENSITIVE Sensitive     IMIPENEM 0.5 SENSITIVE Sensitive     NITROFURANTOIN <=16 SENSITIVE Sensitive     TRIMETH/SULFA >=320 RESISTANT Resistant     AMPICILLIN/SULBACTAM 8 SENSITIVE Sensitive     PIP/TAZO <=4 SENSITIVE Sensitive     * >=100,000 COLONIES/mL ESCHERICHIA COLI   Enterococcus faecium - MIC*    AMPICILLIN >=32 RESISTANT  Resistant     NITROFURANTOIN 128 RESISTANT Resistant     VANCOMYCIN 1 SENSITIVE Sensitive     * 60,000 COLONIES/mL ENTEROCOCCUS FAECIUM    Labs: CBC: Recent Labs  Lab 02/02/22 1327 02/03/22 0333 02/03/22 1929 02/04/22 0301 02/06/22 0555  WBC 3.5* 2.9*  --  2.5* 2.6*  NEUTROABS 3.0  --   --   --   --   HGB 7.1* 6.4* 8.7* 8.8* 9.9*  HCT 22.9* 20.7* 27.0* 27.5* 30.8*  MCV 106.5* 107.3*  --  98.2 98.4  PLT 111* 89*  --  85* 61*   Basic Metabolic Panel: Recent Labs  Lab 02/02/22 1327 02/03/22 0333 02/04/22 0301 02/05/22 0306 02/06/22 0555 02/07/22 0519  NA 133* 133* 133* 132* 132* 129*  K 4.2 4.2 3.0* 3.1* 3.0* 3.7  CL 96* 99 97* 96* 94* 94*  CO2 '25 25 26 26 29 26  '$ GLUCOSE 158* 110* 166* 231* 232* 239*  BUN 34* 39* 20 29* 22 35*  CREATININE 4.43* 4.96* 2.92* 4.07* 3.29* 4.31*  CALCIUM 9.2 9.0 8.0* 8.2* 8.2* 8.2*  MG 1.7  --   --   --   --   --   PHOS  --   --  3.0 4.6 3.4 4.5   Liver Function Tests: Recent Labs  Lab 02/02/22 1327 02/04/22 0250 02/04/22 0301 02/05/22 0306 02/06/22 0555 02/07/22 0519  AST 46* 37  --   --   --   --   ALT 32 25  --   --   --   --   ALKPHOS 144* 122  --   --   --   --   BILITOT 1.2 1.4*  --   --   --   --   PROT 6.6 5.9*  --   --   --   --   ALBUMIN 2.8* 2.4* 2.4* 2.5* 2.5* 2.6*   CBG: Recent Labs  Lab 02/06/22 0705 02/06/22 1239 02/06/22 1606 02/06/22 2054 02/07/22 1123  GLUCAP 228* 186* 229* 289* 260*    Discharge time spent: greater than 30 minutes.  Signed: Orson Eva, MD [facility] 02/07/2022

## 2022-02-07 NOTE — Assessment & Plan Note (Signed)
Spouse related that she dispenses Tresiba prn when sugars are >250 --also states he had been prone to hypoglycemic events --07/29/21 A1C--5.5 --discussed with spouse that for the short term, will d/c to SNF with just short acting sliding scale insulin and she may resume patient's prior regimen after d/c from SNF

## 2022-02-07 NOTE — Progress Notes (Addendum)
Pharmacy Antibiotic Note  Jeffrey [name] is a 76 y.o. male admitted on 02/02/2022 with  ESBL E.Coli UTI .  Pharmacy has been consulted for Merrem dosing. ESRD on HD  Plan: Merrem '1000mg'$  IV q24h, to be given after HD on HD days  F/U cxs and clinical progress Monitor V/S, labs  Height: '5\' 5"'$  (165.1 cm) Weight: 75 kg (165 lb 5.5 oz) IBW/kg (Calculated) : 61.5  Temp (24hrs), Avg:98.3 F (36.8 C), Min:97.6 F (36.4 C), Max:99 F (37.2 C)  Recent Labs  Lab 02/02/22 1327 02/03/22 0333 02/04/22 0301 02/05/22 0306 02/06/22 0555 02/07/22 0519  WBC 3.5* 2.9* 2.5*  --  2.6*  --   CREATININE 4.43* 4.96* 2.92* 4.07* 3.29* 4.31*     Estimated Creatinine Clearance: 14 mL/min (A) (by C-G formula based on SCr of 4.31 mg/dL (H)).    Allergies  Allergen Reactions   Tape Itching, Rash and Other (See Comments)    Tears skin    Antimicrobials this admission: Merrem 9/6 >>   Microbiology results: 9/4 UCX: E.Coli  ESBL >100K CFU/ml S- imipenem R- septra E.Faecium 60K CFU/ml  Thank you for allowing pharmacy to be a part of this patient's care.  Thomasenia Sales, PharmD, [facility] Clinical Pharmacist  02/07/2022 7:54 AM

## 2022-02-07 NOTE — TOC Transition Note (Signed)
Transition of Care [facility] - CM/SW Discharge Note   Patient Details  Name: Jeffrey [name] MRN: [id number] Date of Birth: 1945/10/06  Transition of Care [facility]) CM/SW Contact:  Boneta Lucks, RN Phone Number: 02/07/2022, 12:36 PM   Clinical Narrative:   Patient can discharge to [facility] rehab after completing dialysis today.  Ebony Hail provided room number, RN will call report. DC summary sent in the hub. Patient's wife will come at 3:15 to transport patient to [facility].    Final next level of care: Skilled Nursing Facility Barriers to Discharge: Barriers Resolved   Patient Goals and CMS Choice Patient states their goals for this hospitalization and ongoing recovery are:: rehab then home     Discharge Placement              Patient and family notified of of transfer: 02/07/22  Discharge Plan and Services     Post Acute Care Choice: [place]              Readmission Risk Interventions    02/07/2022   12:34 PM 01/20/2021    2:33 PM 04/03/2020   10:46 AM  Readmission Risk Prevention Plan  Transportation Screening Complete Complete Complete  PCP or Specialist Appt within 3-5 Days Complete Complete   HRI or Home Care Consult Complete Complete   Social Work Consult for [place] Planning/Counseling Complete Complete   Palliative Care Screening Not Applicable Not Applicable   Medication Review Press photographer) Complete Complete Complete  HRI or Home Care Consult   Complete  SW Recovery Care/Counseling Consult   Complete  [place]   Not Applicable

## 2022-02-07 NOTE — Procedures (Signed)
   HEMODIALYSIS TREATMENT NOTE:   Uneventful 3.5 hour heparin-free HD session completed using left upper arm AVF (15g/antegrade).  Vancomycin and Meropenem given at end of treatment. Goal met: 2 liters removed.  All blood was returned and hemostasis was achieved in 25 minutes. No changes from pre-HD assessment.  Hand-off given to Warner Mccreedy, LPN.   Rockwell Alexandria, RN

## 2022-02-07 NOTE — Progress Notes (Signed)
Nsg Discharge Note  Admit Date:  02/02/2022 Discharge date: 02/07/2022   Pollie Friar to be D/C'd Skilled nursing facility per MD order.   Patient/caregiver able to verbalize understanding.  Discharge Medication: Allergies as of 02/07/2022       Reactions   Tape Itching, Rash, Other (See Comments)   Tears skin        Medication List     STOP taking these medications    colchicine 0.6 MG tablet   gabapentin 100 MG capsule Commonly known as: NEURONTIN   oxyCODONE-acetaminophen 5-325 MG tablet Commonly known as: PERCOCET/ROXICET   spironolactone 50 MG tablet Commonly known as: ALDACTONE   sulfamethoxazole-trimethoprim 800-160 MG tablet Commonly known as: BACTRIM DS   tiZANidine 2 MG tablet Commonly known as: ZANAFLEX   Tresiba FlexTouch 200 UNIT/ML FlexTouch Pen Generic drug: insulin degludec       TAKE these medications    acetaminophen 650 MG CR tablet Commonly known as: TYLENOL Take 650 mg by mouth every 8 (eight) hours as needed for pain.   Alcohol Prep 70 % Pads Apply 1 each topically 3 (three) times daily.   Alka-Seltzer Heartburn + Gas 750-80 MG Chew Generic drug: Calcium Carbonate-Simethicone Chew 1-2 tablets by mouth 2 (two) times daily as needed (acid reflux).   allopurinol 300 MG tablet Commonly known as: ZYLOPRIM Take 300 mg by mouth daily.   ALPRAZolam 0.5 MG tablet Commonly known as: XANAX Take 1 tablet (0.5 mg total) by mouth See admin instructions. Take one tablet (0.5 mg) by mouth on Monday, Wednesday, Friday before dialysis, may also take one tablet (0.5 mg) daily as needed for anxiety.   BD Pen Needle Micro U/F 32G X 6 MM Misc Generic drug: Insulin Pen Needle SMARTSIG:SUB-Q Daily PRN   calcium carbonate 500 MG chewable tablet Commonly known as: TUMS - dosed in mg elemental calcium Chew 1 tablet by mouth 3 (three) times daily with meals.   cetirizine 10 MG tablet Commonly known as: ZYRTEC Take 10 mg by mouth daily.    cholecalciferol 25 MCG (1000 UNIT) tablet Commonly known as: VITAMIN D3 Take 1,000 Units by mouth daily.   diltiazem 120 MG 24 hr capsule Commonly known as: DILACOR XR Take 120 mg by mouth daily.   famotidine 20 MG tablet Commonly known as: Pepcid Take 1 tablet (20 mg total) by mouth 2 (two) times daily.   fosfomycin 3 g Pack Commonly known as: MONUROL Take 3 g by mouth once for 1 dose. Start taking on: February 08, 2022   FreeStyle Libre 2 Sensor Misc Apply topically.   furosemide 40 MG tablet Commonly known as: LASIX Take 80 mg by mouth daily.   Garlic 7169 MG Caps Take 1,000 mg by mouth daily.   GlucaGen HypoKit 1 MG Solr injection Generic drug: glucagon Inject 1 mg into the muscle once as needed for low blood sugar.   HumaLOG KwikPen 100 UNIT/ML KwikPen Generic drug: insulin lispro 10-50 Units 3 (three) times daily. Sliding scale   hydrOXYzine 25 MG tablet Commonly known as: ATARAX Take 25 mg by mouth 3 (three) times daily. For itching   iron polysaccharides 150 MG capsule Commonly known as: NIFEREX Take 150 mg by mouth daily with supper.   lactulose 10 GM/15ML solution Commonly known as: CHRONULAC Take 15 mLs (10 g total) by mouth 2 (two) times daily. What changed:  how much to take when to take this   metoprolol succinate 25 MG 24 hr tablet Commonly known as: TOPROL-XL Take  1 tablet (25 mg total) by mouth See admin instructions. Take one tablet (25 mg) by mouth at bedtime every night and take one tablet (25 mg) in morning on  Sun, Tues, Thurs and Sat (non-dialysis days)   Milk Thistle 250 MG Caps Take 250 mg by mouth in the morning, at noon, and at bedtime.   mirtazapine 30 MG tablet Commonly known as: REMERON Take 1 tablet (30 mg total) by mouth at bedtime. What changed: when to take this   multivitamin with minerals Tabs tablet Take 1 tablet by mouth in the morning.   omega-3 acid ethyl esters 1 g capsule Commonly known as: LOVAZA Take 2 g  by mouth 2 (two) times daily.   ondansetron 4 MG tablet Commonly known as: ZOFRAN Take 4 mg by mouth every 8 (eight) hours as needed for nausea or vomiting.   pravastatin 20 MG tablet Commonly known as: PRAVACHOL Take 20 mg by mouth in the morning.   PROBIOTIC DAILY PO Take 1 tablet by mouth in the morning.   thiamine 100 MG tablet Commonly known as: Vitamin B-1 Take 100 mg by mouth daily.   TRI-VIT/FLUORIDE/IRON PO Take 1 tablet by mouth daily.   triamcinolone cream 0.1 % Commonly known as: KENALOG Apply 1 application topically 2 (two) times daily as needed (dry skin Itching).   venlafaxine 75 MG tablet Commonly known as: EFFEXOR Take 75 mg by mouth 3 (three) times daily with meals.   vitamin C 1000 MG tablet Take 1,000 mg by mouth in the morning.   Xifaxan 550 MG Tabs tablet Generic drug: rifaximin TAKE 1 TABLET BY MOUTH TWICE DAILY What changed: how much to take        Discharge Assessment: Vitals:   02/07/22 1105 02/07/22 1130  BP: (!) 146/70 (!) 144/69  Pulse: 86 89  Resp: 17 16  Temp:    SpO2: 95%    Skin clean, dry and intact without evidence of skin break down, no evidence of skin tears noted. IV catheter discontinued intact. Site without signs and symptoms of complications - no redness or edema noted at insertion site, patient denies c/o pain - only slight tenderness at site.  Dressing with slight pressure applied.  D/c Instructions-Education: Discharge instructions given to patient/family with verbalized understanding. D/c education completed with patient/family including follow up instructions, medication list, d/c activities limitations if indicated, with other d/c instructions as indicated by MD - patient able to verbalize understanding, all questions fully answered. Patient instructed to return to ED, call 911, or call MD for any changes in condition.  Patient escorted via [place], and D/C home via private auto.  Kathie Rhodes, RN 02/07/2022 12:08  PM

## 2022-02-08 ENCOUNTER — Other Ambulatory Visit: Payer: Self-pay | Admitting: Gastroenterology

## 2022-02-09 NOTE — Telephone Encounter (Addendum)
Noted. We can still make arrangements for a follow up, shoot for 4 weeks, if he is still at the [facility], they can bring him.

## 2022-02-12 ENCOUNTER — Telehealth: Payer: Self-pay

## 2022-02-12 ENCOUNTER — Encounter (HOSPITAL_COMMUNITY): Payer: Self-pay | Admitting: Gastroenterology

## 2022-02-12 NOTE — Telephone Encounter (Signed)
Wife called and made aware of need to change 9/19 appointment.  Wife states patient is currently admitted to the [facility].  [facility] called and made aware of pt need and have catheter changed on 9/19.  Spoke with floor nurse Flo who states patient's catheter can be changed at facility.  9/19 appointment canceled here at the office. Wife called and made aware.

## 2022-02-17 ENCOUNTER — Ambulatory Visit: Payer: Medicare Other

## 2022-02-19 ENCOUNTER — Encounter (HOSPITAL_COMMUNITY): Payer: Self-pay | Admitting: Emergency Medicine

## 2022-02-19 ENCOUNTER — Other Ambulatory Visit: Payer: Self-pay

## 2022-02-19 ENCOUNTER — Observation Stay (HOSPITAL_COMMUNITY)
Admission: EM | Admit: 2022-02-19 | Discharge: 2022-02-21 | Disposition: A | Discharge status: Home Health | Payer: Medicare Other | Attending: Internal Medicine | Admitting: Internal Medicine

## 2022-02-19 DIAGNOSIS — I4891 Unspecified atrial fibrillation: Secondary | ICD-10-CM | POA: Diagnosis not present

## 2022-02-19 DIAGNOSIS — D500 Iron deficiency anemia secondary to blood loss (chronic): Secondary | ICD-10-CM | POA: Diagnosis present

## 2022-02-19 DIAGNOSIS — D62 Acute posthemorrhagic anemia: Secondary | ICD-10-CM | POA: Diagnosis present

## 2022-02-19 DIAGNOSIS — Z79899 Other long term (current) drug therapy: Secondary | ICD-10-CM | POA: Diagnosis not present

## 2022-02-19 DIAGNOSIS — D61818 Other pancytopenia: Secondary | ICD-10-CM | POA: Diagnosis not present

## 2022-02-19 DIAGNOSIS — K7031 Alcoholic cirrhosis of liver with ascites: Secondary | ICD-10-CM | POA: Diagnosis present

## 2022-02-19 DIAGNOSIS — R739 Hyperglycemia, unspecified: Secondary | ICD-10-CM

## 2022-02-19 DIAGNOSIS — Z87891 Personal history of nicotine dependence: Secondary | ICD-10-CM | POA: Insufficient documentation

## 2022-02-19 DIAGNOSIS — Z23 Encounter for immunization: Secondary | ICD-10-CM | POA: Diagnosis not present

## 2022-02-19 DIAGNOSIS — R296 Repeated falls: Secondary | ICD-10-CM | POA: Diagnosis present

## 2022-02-19 DIAGNOSIS — F418 Other specified anxiety disorders: Secondary | ICD-10-CM

## 2022-02-19 DIAGNOSIS — D649 Anemia, unspecified: Secondary | ICD-10-CM

## 2022-02-19 DIAGNOSIS — K7469 Other cirrhosis of liver: Secondary | ICD-10-CM

## 2022-02-19 DIAGNOSIS — Z992 Dependence on renal dialysis: Secondary | ICD-10-CM

## 2022-02-19 DIAGNOSIS — E876 Hypokalemia: Secondary | ICD-10-CM | POA: Diagnosis not present

## 2022-02-19 DIAGNOSIS — R2689 Other abnormalities of gait and mobility: Secondary | ICD-10-CM | POA: Diagnosis not present

## 2022-02-19 DIAGNOSIS — I509 Heart failure, unspecified: Secondary | ICD-10-CM | POA: Insufficient documentation

## 2022-02-19 DIAGNOSIS — M6281 Muscle weakness (generalized): Secondary | ICD-10-CM | POA: Insufficient documentation

## 2022-02-19 DIAGNOSIS — I10 Essential (primary) hypertension: Secondary | ICD-10-CM | POA: Diagnosis present

## 2022-02-19 DIAGNOSIS — Z794 Long term (current) use of insulin: Secondary | ICD-10-CM

## 2022-02-19 DIAGNOSIS — I132 Hypertensive heart and chronic kidney disease with heart failure and with stage 5 chronic kidney disease, or end stage renal disease: Secondary | ICD-10-CM | POA: Diagnosis not present

## 2022-02-19 DIAGNOSIS — E1122 Type 2 diabetes mellitus with diabetic chronic kidney disease: Secondary | ICD-10-CM

## 2022-02-19 DIAGNOSIS — N184 Chronic kidney disease, stage 4 (severe): Secondary | ICD-10-CM

## 2022-02-19 DIAGNOSIS — J9611 Chronic respiratory failure with hypoxia: Secondary | ICD-10-CM

## 2022-02-19 DIAGNOSIS — N186 End stage renal disease: Secondary | ICD-10-CM | POA: Insufficient documentation

## 2022-02-19 DIAGNOSIS — K625 Hemorrhage of anus and rectum: Secondary | ICD-10-CM | POA: Diagnosis present

## 2022-02-19 DIAGNOSIS — E8809 Other disorders of plasma-protein metabolism, not elsewhere classified: Secondary | ICD-10-CM

## 2022-02-19 DIAGNOSIS — W19XXXA Unspecified fall, initial encounter: Secondary | ICD-10-CM | POA: Diagnosis present

## 2022-02-19 LAB — PREPARE RBC (CROSSMATCH)

## 2022-02-19 LAB — COMPREHENSIVE METABOLIC PANEL
ALT: 53 U/L — ABNORMAL HIGH (ref 0–44)
AST: 55 U/L — ABNORMAL HIGH (ref 15–41)
Albumin: 2.6 g/dL — ABNORMAL LOW (ref 3.5–5.0)
Alkaline Phosphatase: 381 U/L — ABNORMAL HIGH (ref 38–126)
Anion gap: 11 (ref 5–15)
BUN: 20 mg/dL (ref 8–23)
CO2: 25 mmol/L (ref 22–32)
Calcium: 8.2 mg/dL — ABNORMAL LOW (ref 8.9–10.3)
Chloride: 94 mmol/L — ABNORMAL LOW (ref 98–111)
Creatinine, Ser: 2.48 mg/dL — ABNORMAL HIGH (ref 0.61–1.24)
GFR, Estimated: 26 mL/min — ABNORMAL LOW (ref >60)
Glucose, Bld: 332 mg/dL — ABNORMAL HIGH (ref 70–99)
Potassium: 3 mmol/L — ABNORMAL LOW (ref 3.5–5.1)
Sodium: 130 mmol/L — ABNORMAL LOW (ref 135–145)
Total Bilirubin: 0.9 mg/dL (ref 0.3–1.2)
Total Protein: 6.5 g/dL (ref 6.5–8.1)

## 2022-02-19 LAB — MAGNESIUM: Magnesium: 1.7 mg/dL (ref 1.7–2.4)

## 2022-02-19 LAB — CBC
HCT: 24.3 % — ABNORMAL LOW (ref 39.0–52.0)
HCT: 25.9 % — ABNORMAL LOW (ref 39.0–52.0)
Hemoglobin: 7.7 g/dL — ABNORMAL LOW (ref 13.0–17.0)
Hemoglobin: 8.4 g/dL — ABNORMAL LOW (ref 13.0–17.0)
MCH: 31.8 pg (ref 26.0–34.0)
MCH: 32.1 pg (ref 26.0–34.0)
MCHC: 31.7 g/dL (ref 30.0–36.0)
MCHC: 32.4 g/dL (ref 30.0–36.0)
MCV: 100.4 fL — ABNORMAL HIGH (ref 80.0–100.0)
MCV: 98.9 fL (ref 80.0–100.0)
Platelets: 113 10*3/uL — ABNORMAL LOW (ref 150–400)
Platelets: 124 10*3/uL — ABNORMAL LOW (ref 150–400)
RBC: 2.42 MIL/uL — ABNORMAL LOW (ref 4.22–5.81)
RBC: 2.62 MIL/uL — ABNORMAL LOW (ref 4.22–5.81)
RDW: 19.9 % — ABNORMAL HIGH (ref 11.5–15.5)
RDW: 19.6 % — ABNORMAL HIGH (ref 11.5–15.5)
WBC: 2.9 10*3/uL — ABNORMAL LOW (ref 4.0–10.5)
WBC: 3.2 10*3/uL — ABNORMAL LOW (ref 4.0–10.5)
nRBC: 0 % (ref 0.0–0.2)
nRBC: 0 % (ref 0.0–0.2)

## 2022-02-19 LAB — POC OCCULT BLOOD, ED: Occult Blood: POSITIVE

## 2022-02-19 LAB — PROTIME-INR
INR: 1.2 (ref 0.8–1.2)
Prothrombin Time: 15.3 seconds — ABNORMAL HIGH (ref 11.4–15.2)

## 2022-02-19 LAB — GLUCOSE, CAPILLARY: Glucose-Capillary: 239 mg/dL — ABNORMAL HIGH (ref 70–99)

## 2022-02-19 MED ORDER — CALCIUM CARBONATE ANTACID 500 MG PO CHEW
1.0000 | CHEWABLE_TABLET | Freq: Three times a day (TID) | ORAL | Status: DC
  Administered 2022-02-20 – 2022-02-21 (×5): 200 mg via ORAL
  Filled 2022-02-19 (×5): qty 1

## 2022-02-19 MED ORDER — PANTOPRAZOLE INFUSION (NEW) - SIMPLE MED
8.0000 mg/h | INTRAVENOUS | Status: DC
  Administered 2022-02-19 – 2022-02-21 (×4): 8 mg/h via INTRAVENOUS
  Filled 2022-02-19: qty 80
  Filled 2022-02-19 (×3): qty 100
  Filled 2022-02-19: qty 80
  Filled 2022-02-19 (×4): qty 100

## 2022-02-19 MED ORDER — ALLOPURINOL 100 MG PO TABS
300.0000 mg | ORAL_TABLET | Freq: Every day | ORAL | Status: DC
  Administered 2022-02-20 – 2022-02-21 (×2): 300 mg via ORAL
  Filled 2022-02-19 (×2): qty 3

## 2022-02-19 MED ORDER — PANTOPRAZOLE SODIUM 40 MG IV SOLR: 40.0000 mg | Freq: Two times a day (BID) | INTRAVENOUS | Status: DC

## 2022-02-19 MED ORDER — ACETAMINOPHEN 325 MG PO TABS: 650.0000 mg | ORAL_TABLET | Freq: Four times a day (QID) | ORAL | Status: DC | PRN

## 2022-02-19 MED ORDER — POTASSIUM CHLORIDE 10 MEQ/100ML IV SOLN: 10.0000 meq | Freq: Once | INTRAVENOUS | Status: DC

## 2022-02-19 MED ORDER — POTASSIUM CHLORIDE CRYS ER 20 MEQ PO TBCR
40.0000 meq | EXTENDED_RELEASE_TABLET | Freq: Once | ORAL | Status: AC
  Administered 2022-02-19: 40 meq via ORAL
  Filled 2022-02-19: qty 2

## 2022-02-19 MED ORDER — PANTOPRAZOLE SODIUM 40 MG IV SOLR
INTRAVENOUS | Status: AC
  Filled 2022-02-19: qty 20

## 2022-02-19 MED ORDER — POLYETHYLENE GLYCOL 3350 17 G PO PACK: 17.0000 g | PACK | Freq: Every day | ORAL | Status: DC | PRN

## 2022-02-19 MED ORDER — INFLUENZA VAC A&B SA ADJ QUAD 0.5 ML IM PRSY
0.5000 mL | PREFILLED_SYRINGE | INTRAMUSCULAR | Status: AC
  Administered 2022-02-20: 0.5 mL via INTRAMUSCULAR
  Filled 2022-02-19: qty 0.5

## 2022-02-19 MED ORDER — MIRTAZAPINE 30 MG PO TABS
30.0000 mg | ORAL_TABLET | Freq: Every day | ORAL | Status: DC
  Administered 2022-02-19 – 2022-02-20 (×2): 30 mg via ORAL
  Filled 2022-02-19 (×2): qty 1

## 2022-02-19 MED ORDER — FUROSEMIDE 40 MG PO TABS
80.0000 mg | ORAL_TABLET | Freq: Every day | ORAL | Status: DC
  Administered 2022-02-20 – 2022-02-21 (×2): 80 mg via ORAL
  Filled 2022-02-19 (×2): qty 2

## 2022-02-19 MED ORDER — INSULIN ASPART 100 UNIT/ML IJ SOLN
0.0000 [IU] | INTRAMUSCULAR | Status: DC
  Administered 2022-02-19: 3 [IU] via SUBCUTANEOUS
  Administered 2022-02-20 (×3): 1 [IU] via SUBCUTANEOUS
  Administered 2022-02-20: 2 [IU] via SUBCUTANEOUS
  Administered 2022-02-20: 7 [IU] via SUBCUTANEOUS
  Administered 2022-02-20: 5 [IU] via SUBCUTANEOUS
  Administered 2022-02-20: 3 [IU] via SUBCUTANEOUS
  Administered 2022-02-21 (×3): 2 [IU] via SUBCUTANEOUS

## 2022-02-19 MED ORDER — VENLAFAXINE HCL 37.5 MG PO TABS
75.0000 mg | ORAL_TABLET | Freq: Three times a day (TID) | ORAL | Status: DC
  Administered 2022-02-20 – 2022-02-21 (×4): 75 mg via ORAL
  Filled 2022-02-19 (×4): qty 2

## 2022-02-19 MED ORDER — ALPRAZOLAM 0.5 MG PO TABS: 0.5000 mg | ORAL_TABLET | Freq: Every day | ORAL | Status: DC | PRN

## 2022-02-19 MED ORDER — ACETAMINOPHEN 650 MG RE SUPP: 650.0000 mg | Freq: Four times a day (QID) | RECTAL | Status: DC | PRN

## 2022-02-19 MED ORDER — ALPRAZOLAM 0.5 MG PO TABS
0.5000 mg | ORAL_TABLET | ORAL | Status: DC
  Administered 2022-02-20: 0.5 mg via ORAL
  Filled 2022-02-19: qty 1

## 2022-02-19 MED ORDER — SODIUM CHLORIDE 0.9% IV SOLUTION: Freq: Once | INTRAVENOUS | Status: AC

## 2022-02-19 MED ORDER — PANTOPRAZOLE 80MG IVPB - SIMPLE MED
80.0000 mg | Freq: Once | INTRAVENOUS | Status: AC
  Administered 2022-02-19: 80 mg via INTRAVENOUS
  Filled 2022-02-19: qty 100

## 2022-02-19 MED ORDER — RIFAXIMIN 550 MG PO TABS
550.0000 mg | ORAL_TABLET | Freq: Two times a day (BID) | ORAL | Status: DC
  Administered 2022-02-19 – 2022-02-21 (×4): 550 mg via ORAL
  Filled 2022-02-19 (×4): qty 1

## 2022-02-19 MED ORDER — LACTULOSE 10 GM/15ML PO SOLN
10.0000 g | Freq: Two times a day (BID) | ORAL | Status: DC
  Administered 2022-02-19 – 2022-02-20 (×3): 10 g via ORAL
  Filled 2022-02-19 (×4): qty 30

## 2022-02-19 NOTE — ED Triage Notes (Signed)
Pt arrived by EMS due to finding blood in stool yesterday, pt complains of feeling dizzy at the rehab facility for the past week, pt denies nausea, vomiting, pain.

## 2022-02-19 NOTE — H&P (Addendum)
History and Physical    Jeffrey [name] URK:[id number] DOB: [birth year] DOA: 02/19/2022  PCP: Jeffrey Blitz, MD   Patient coming from: Nursing home  I have personally briefly reviewed patient's old medical records in [place]  Chief Complaint: Rectal Bleeding   HPI: Jeffrey [name] is a 76 y.o. male with medical history significant for alcoholic liver cirrhosis, with chronic respiratory failure, ESRD, esophageal varices, hypertension. Patient presented to the ED with complaints of rectal bleeding that started yesterday.  She reports seeing a large amount of blood in the toilet.  Last episode was today before arrival in the ED. He also reports melena. He denies abdominal pain.  No vomiting of blood.  Not on anticoagulation.  Reports dizziness when standing up.  Chest pain or difficulty breathing. Hemodialysis Monday Wednesday Friday.  He completed his HD session yesterday.  Recent hospitalization 9/4 - 9/9 for acute metabolic encephalopathy-thought secondary to UTI, hepatic encephalopath.  Also with acute on chronic anemia, requiring 2 units PRBC.  Underwent EGD 9/8- G1 esoph varices; portal HTN gastropathy; red blood in posterior gastric body  tx with APC/hemoclip.   ED Course: Blood pressure 140s to 150s.  Heart rate 80s.  Hemoglobin down to 7.7 down from last check of 9.9  - 9/8.  1 unit of blood ordered for transfusion.  Hospitalist admit for GI bleed.  Review of Systems: As per HPI all other systems reviewed and negative.  Past Medical History:  Diagnosis Date   A-fib [facility])    when initially stating dialysis january 2021 at [facility] per spouse; never heard anything else about it   Alcoholic cirrhosis [facility])    patient reports completing hep a and b vaccines in 2006   Anemia    has had 3 units of prbcs 2013   Anxiety    Arthritis    Asymptomatic gallstones    Ultrasound in 2006   B12 deficiency 11/21/2020   C. difficile colitis    Cholelithiasis    Chronic kidney  disease    Dialysis M/W/F/Sa   Depression    Diabetes ([place])    Duodenal ulcer with hemorrhage    per patient in 2006 or 2007, records have been requested   Dyspnea    low oxygen level -  has O2 2 L all day   Esophageal varices ([place])    see [facility]   GERD (gastroesophageal reflux disease)    Heart burn    History of alcohol abuse    quit 05/2013   HOH (hard of hearing)    Hypertension    Liver cirrhosis ([place])    ETOH   SBP (spontaneous bacterial peritonitis) ([place])    2020, November 2021.    Splenomegaly    Ultrasound in 2006    Past Surgical History:  Procedure Laterality Date   AGILE CAPSULE N/A 09/20/2019   Procedure: AGILE CAPSULE;  Surgeon: Daneil Dolin, MD;  [facility];  Service: Endoscopy;  Laterality: N/A;   APPENDECTOMY     AV FISTULA PLACEMENT Left 08/17/2019   Procedure: ARTERIOVENOUS (AV) FISTULA CREATION VERSES ARTERIOVENOUS GRAFT;  Surgeon: [name], MD;  Location: [facility] OR;  Service: Vascular;  Laterality: Left;   BASCILIC VEIN TRANSPOSITION Left 09/28/2019   Procedure: LEFT SECOND STAGE [place];  Surgeon: [name], MD;  Location: [place];  Service: Vascular;  Laterality: Left;   BIOPSY  02/03/2018   Procedure: BIOPSY;  Surgeon: Daneil Dolin, MD;  [facility];  Service: Endoscopy;;  bx of gastric polyps   BIOPSY  09/19/2019   Procedure: BIOPSY;  Surgeon: Danie Binder, MD;  [facility];  Service: Endoscopy;;   BIOPSY  01/16/2021   Procedure: BIOPSY;  Surgeon: Daneil Dolin, MD;  [facility];  Service: Endoscopy;;   CATARACT EXTRACTION Right    COLONOSCOPY  03/10/2005   Rectal polyp as described above, removed with snare. Left sided  diverticula. The remainder of the colonic mucosa appeared normal. Inflammed polyp on path.   COLONOSCOPY  04/1999   Dr. Thea Silversmith polyps removed,  Path showed hyperplastic   COLONOSCOPY  10/2015   Dr. Britta Mccreedy: diverticulosis, single sessile tubular  adenoma 3-11m in size removed from descending colon.    COLONOSCOPY WITH ESOPHAGOGASTRODUODENOSCOPY (EGD)  03/31/2012   RXLK:[id number]. Colonic diverticulosis. tubular adenoma colon   COLONOSCOPY WITH ESOPHAGOGASTRODUODENOSCOPY (EGD)  06/2019   FORSYTH: small esophageal varices without high risk stigmata, single large AVM on lesser curvature in gastric body s/p APC ablation, gastric antral and duodenal bulb polyposis. No significant source to explain transfusion dependent anemia. Colonoscopy with portal colopathy and diffuse edema, changes of Cdiff colitis on colonoscopy   COLONOSCOPY WITH PROPOFOL N/A 01/19/2021   Surgeon: CDaryel November MD; 15 mm polyp in descending colon removed piecemeal, 6 mm polyp in ascending colon and 12 mm polyp in sigmoid colon not removed due to high bleeding risk, benign polypoid lesion at splenic flexure, diverticulosis in sigmoid and descending colon, rectal varices.   COLONOSCOPY WITH PROPOFOL N/A 01/16/2021   Surgeon: RDaneil Dolin MD;  Portal colopathy, large rectal varices, polypoid mass at splenic flexure biopsy, two 1 cm left colon polyps not removed, left-sided diverticulosis.  Reported patient had bleeding from splenic flexure lesion.  Pathology with acutely inflamed colonic mucosa with localized ulceration and prominent reactive changes.   ESOPHAGEAL BANDING N/A 02/03/2018   Procedure: ESOPHAGEAL BANDING;  Surgeon: RDaneil Dolin MD;  [facility];  Service: Endoscopy;  Laterality: N/A;   ESOPHAGEAL BANDING  01/16/2021   Procedure: ESOPHAGEAL BANDING;  Surgeon: RDaneil Dolin MD;  [facility];  Service: Endoscopy;;   ESOPHAGOGASTRODUODENOSCOPY  01/15/2005   Three columns grade 1 to 2 esophageal varices, otherwise normal esophageal mucosa.  Esophagus was not manipulated otherwise./Nodularity of the antrum with overlying erosions, nonspecific finding. Path showed rare H.pylori   ESOPHAGOGASTRODUODENOSCOPY  09/2008   Dr.  RGaylene Brookscolumns of grade 2-3 esoph varices, only one column was prominent. Portal gastropathy, multiple gastrc polyps at antrum, two were 2cm with black eschar, bulbar polyps, bulbar erosions   ESOPHAGOGASTRODUODENOSCOPY  11/2004   Dr. FBrantley Stage3 esoph varices   ESOPHAGOGASTRODUODENOSCOPY  03/31/2012   RMR: 4 columns(3-GR 2, 1-GR1) non-bleeding esophageal varices, portal gastropathy, small HH, early GAVE, multiple gastric polyps    ESOPHAGOGASTRODUODENOSCOPY (EGD) WITH PROPOFOL N/A 02/03/2018   Dr. RGala Romney Esophageal varices, 3 columns grade 1-2.  Portal hypertensive gastropathy.  Multiple gastric polyps, biopsy consistent with hyperplastic.   ESOPHAGOGASTRODUODENOSCOPY (EGD) WITH PROPOFOL N/A 09/19/2019   Fields: grade I and II esophageal varcies, mild portal hypertensive gastropathy, moderate gastritis but no H. pylori, single hyperplastic gastric polyp removed, obvious source for melena/transfusion dependent anemia not identified, may be due to friable gastric and duodenal mucosa in the setting of portal hypertension   ESOPHAGOGASTRODUODENOSCOPY (EGD) WITH PROPOFOL N/A 01/16/2021   Surgeon: RDaneil Dolin MD; Portal hypertensive gastropathy, grade 2 esophageal varices with bleeding stigmata s/p banding x4, slight nodularity  and congestion involving bulb and second portion of duodenum, nonspecific.  Recommended repeat EGD in 4 weeks.   ESOPHAGOGASTRODUODENOSCOPY (EGD) WITH PROPOFOL N/A 05/22/2021   stigmata of prior esophageal banding and no significant varices seen. Portal gastropathy. Polypoid gastric and duodenal mucosa with overlying erosions, marked mucosal friability.   ESOPHAGOGASTRODUODENOSCOPY (EGD) WITH PROPOFOL N/A 02/06/2022   Procedure: ESOPHAGOGASTRODUODENOSCOPY (EGD) WITH PROPOFOL;  Surgeon: Harvel Quale, MD;  [facility];  Service: Gastroenterology;  Laterality: N/A;   GASTRIC VARICES BANDING  03/31/2012   Procedure: GASTRIC VARICES BANDING;   Surgeon: Daneil Dolin, MD;  [facility];  Service: Endoscopy;  Laterality: N/A;   GIVENS CAPSULE STUDY N/A 09/21/2019   Poor study with a lot of debris obstructing much of the view of the first approximate 3 hours out of 6 hours.  No obvious source of bleeding identified.  Sequela of bleeding and old blood seen in the form of"whisps" of blood, blood flecks mostly toward the end of the study.   HEMOSTASIS CLIP PLACEMENT  01/19/2021   Procedure: HEMOSTASIS CLIP PLACEMENT;  Surgeon: [name], MD;  Location[facility] ENDOSCOPY;  Service: Gastroenterology;;   HEMOSTASIS CLIP PLACEMENT  02/06/2022   Procedure: HEMOSTASIS CLIP PLACEMENT;  Surgeon: Montez Morita, Quillian Quince, MD;  [facility];  Service: Gastroenterology;;  gastric body   HOT HEMOSTASIS  02/06/2022   Procedure: HOT HEMOSTASIS (ARGON PLASMA COAGULATION/BICAP);  Surgeon: Montez Morita, Quillian Quince, MD;  [facility];  Service: Gastroenterology;;   IR REMOVAL TUN CV CATH W/O [facility]  02/15/2020   POLYPECTOMY  09/19/2019   Procedure: POLYPECTOMY;  Surgeon: Danie Binder, MD;  [facility];  Service: Endoscopy;;   POLYPECTOMY  01/19/2021   Procedure: POLYPECTOMY;  Surgeon: [name], MD;  Location: [facility] ENDOSCOPY;  Service: Gastroenterology;;   UMBILICAL HERNIA REPAIR  2017   exploratory laparotomy, abdominal washout     reports that he quit smoking about 36 years ago. His smoking use included cigarettes. He has a 50.00 pack-year smoking history. He has never used smokeless tobacco. He reports that he does not drink alcohol and does not use drugs.  Allergies  Allergen Reactions   Tape Itching, Rash and Other (See Comments)    Tears skin    Family History  Problem Relation Age of Onset   Colon cancer Father 72       deceased age 18   Breast cancer Sister        deceased   Diabetes Sister    Stroke Mother    Healthy Son    Healthy Daughter    Lung cancer Neg Hx    Ovarian  cancer Neg Hx    Prior to Admission medications   Medication Sig Start Date End Date Taking? Authorizing Provider  acetaminophen (TYLENOL) 650 MG CR tablet Take 650 mg by mouth every 8 (eight) hours as needed for pain.    [provider]  Alcohol Swabs (ALCOHOL PREP) 70 % PADS Apply 1 each topically 3 (three) times daily. 01/31/20   [provider]  allopurinol (ZYLOPRIM) 300 MG tablet Take 300 mg by mouth daily. 12/11/21   [provider]  ALPRAZolam Duanne Moron) 0.5 MG tablet Take 1 tablet (0.5 mg total) by mouth See admin instructions. Take one tablet (0.5 mg) by mouth on Monday, Wednesday, Friday before dialysis, may also take one tablet (0.5 mg) daily as needed for anxiety. 02/07/22   Orson Eva, MD  Ascorbic Acid (VITAMIN C)  1000 MG tablet Take 1,000 mg by mouth in the morning.    [provider]  BD PEN NEEDLE MICRO U/F 32G X 6 MM MISC SMARTSIG:SUB-Q Daily PRN 04/23/21   [provider]  calcium carbonate (TUMS - DOSED IN MG ELEMENTAL CALCIUM) 500 MG chewable tablet Chew 1 tablet by mouth 3 (three) times daily with meals.    [provider]  Calcium Carbonate-Simethicone (ALKA-SELTZER HEARTBURN + GAS) 750-80 MG CHEW Chew 1-2 tablets by mouth 2 (two) times daily as needed (acid reflux).    [provider]  cetirizine (ZYRTEC) 10 MG tablet Take 10 mg by mouth daily. 06/24/21   [provider]  cholecalciferol (VITAMIN D3) 25 MCG (1000 UNIT) tablet Take 1,000 Units by mouth daily.    [provider]  Continuous Blood Gluc Sensor (FREESTYLE LIBRE 2 SENSOR) MISC Apply topically. 10/15/21   [provider]  diltiazem (DILACOR XR) 120 MG 24 hr capsule Take 120 mg by mouth daily. 11/19/21   [provider]  famotidine (PEPCID) 20 MG tablet Take 1 tablet (20 mg total) by mouth 2 (two) times daily. 07/30/21 07/30/22  Barton Dubois, MD  furosemide (LASIX) 40 MG tablet Take 80 mg by mouth daily. 12/29/19   [provider]  Garlic 8676 MG CAPS Take 1,000 mg by mouth daily.    [provider]  GLUCAGEN HYPOKIT 1 MG SOLR injection Inject 1 mg into the muscle once as needed for low blood sugar. 11/28/20   [provider]  HUMALOG KWIKPEN 100 UNIT/ML KwikPen 10-50 Units 3 (three) times daily. Sliding scale 04/23/21   [provider]  hydrOXYzine (ATARAX/VISTARIL) 25 MG tablet Take 25 mg by mouth 3 (three) times daily. For itching 11/23/20   [provider]  iron polysaccharides (NIFEREX) 150 MG capsule Take 150 mg by mouth daily with supper.    [provider]  lactulose (CHRONULAC) 10 GM/15ML solution Take 15 mLs (10 g total) by mouth 2 (two) times daily. 02/07/22   Orson Eva, MD  metoprolol succinate (TOPROL-XL) 25 MG 24 hr tablet Take 1 tablet (25 mg total) by mouth See admin instructions. Take one tablet (25 mg) by mouth at bedtime every night and take one tablet (25 mg) in morning on  Sun, Tues, Thurs and Sat (non-dialysis days) 04/15/21   Murlean Iba, MD  Milk Thistle 250 MG CAPS Take 250 mg by mouth in the morning, at noon, and at bedtime.    [provider]  mirtazapine (REMERON) 30 MG tablet Take 1 tablet (30 mg total) by mouth at bedtime. Patient taking differently: Take 30 mg by mouth daily with supper. 02/10/20   Roxan Hockey, MD  Multiple Vitamin (MULTIVITAMIN WITH MINERALS) TABS tablet Take 1 tablet by mouth in the morning.     [provider]  omega-3 acid ethyl esters (LOVAZA) 1 g capsule Take 2 g by mouth 2 (two) times daily.  04/07/19   [provider]  ondansetron (ZOFRAN) 4 MG tablet Take 4 mg by mouth every 8 (eight) hours as needed for nausea or vomiting.    [provider]  Ped Vitamins ACD Fl-Iron (TRI-VIT/FLUORIDE/IRON PO) Take 1 tablet by mouth daily. 08/01/19   [provider]  pravastatin (PRAVACHOL) 20 MG tablet Take 20 mg by mouth in the morning.     [provider]  Probiotic  Product (PROBIOTIC DAILY PO) Take 1 tablet by mouth in the morning.     [provider]  thiamine (VITAMIN B-1) 100 MG tablet Take 100 mg by mouth daily. 08/27/20   [provider]  triamcinolone cream (KENALOG) 0.1 % Apply 1 application topically 2 (two) times daily as needed (dry skin Itching). 12/30/17   [provider]  venlafaxine (EFFEXOR) 75 MG tablet Take 75 mg by mouth 3 (three) times daily with meals.     [provider]  XIFAXAN 550 MG TABS tablet TAKE 1 TABLET BY MOUTH TWICE DAILY 02/12/22   Annitta Needs, NP    Physical Exam: Vitals:   02/19/22 1400 02/19/22 1500 02/19/22 1533 02/19/22 1549  BP: (!) 145/66 (!) 146/69 (!) 156/65 (!) 153/65  Pulse: 84 85 85 86  Resp: '18 18 18 18  '$ Temp:   98.4 F (36.9 C) 98.4 F (36.9 C)  TempSrc:   Oral Oral  SpO2: 92% 93% 98% 98%  Weight:      Height:        Constitutional: NAD, calm, comfortable Vitals:   02/19/22 1400 02/19/22 1500 02/19/22 1533 02/19/22 1549  BP: (!) 145/66 (!) 146/69 (!) 156/65 (!) 153/65  Pulse: 84 85 85 86  Resp: '18 18 18 18  '$ Temp:   98.4 F (36.9 C) 98.4 F (36.9 C)  TempSrc:   Oral Oral  SpO2: 92% 93% 98% 98%  Weight:      Height:       Eyes: PERRL, lids and conjunctivae normal ENMT: Mucous membranes are moist.  Neck: normal, supple, no masses, no thyromegaly Respiratory: clear to auscultation bilaterally, no wheezing, no crackles. Normal respiratory effort. No accessory muscle use.  Cardiovascular: Regular rate and rhythm, 3/6 murmurs / no rubs / gallops.  Trace bilateral extremity edema.  Lower extremities warm. Abdomen: Distended, no tenderness, no masses palpated. No hepatosplenomegaly. Bowel sounds positive.  Chronic Foley. Musculoskeletal: no clubbing / cyanosis. No joint deformity upper and lower extremities.  Skin: no rashes, lesions, ulcers. No induration Neurologic: Speech fluent, no facial asymmetry, moving extremities spontaneously Psychiatric: Normal  judgment and insight. Alert and oriented x 3. Normal mood.   Labs on Admission: I have personally reviewed following labs and imaging studies  CBC: Recent Labs  Lab 02/19/22 1421  WBC 2.9*  HGB 7.7*  HCT 24.3*  MCV 100.4*  PLT 332*   Basic Metabolic Panel: Recent Labs  Lab 02/19/22 1421  NA 130*  K 3.0*  CL 94*  CO2 25  GLUCOSE 332*  BUN 20  CREATININE 2.48*  CALCIUM 8.2*   Liver Function Tests: Recent Labs  Lab 02/19/22 1421  AST 55*  ALT 53*  ALKPHOS 381*  BILITOT 0.9  PROT 6.5  ALBUMIN 2.6*    Coagulation Profile: Recent Labs  Lab 02/19/22 1421  INR 1.2    Radiological Exams on Admission: No results found.  EKG: None   Assessment/Plan Principal Problem:   Rectal bleeding Active Problems:   ESRD on dialysis ([place])   Alcoholic cirrhosis of liver with ascites (HCC)   Pancytopenia (HCC)   Essential hypertension, benign   Acute on chronic anemia   Anxiety with depression   Hypokalemia  Assessment and Plan: * Rectal bleeding Of 2 days duration, with symptomatic anemia, dizziness, hemoglobin down to 7.7, recent discharge hemoglobin on 9/8-  9.9 after 2 units PRBC during hospitalization. Reports melena also.  Underwent EGD 9/8 - findings of G1 esoph varices; portal HTN gastropathy; red blood in posterior gastric body  tx with APC/hemoclip.  Denies hematemesis or abdominal pain. -Last colonoscopy 12/2020-sigmoid and descending colon  diverticulosis, polyps, rectal varices,Benign polypoid lesion at the splenic flexure, likely inflammatory polyp. This was felt to be the source of bleeding given the adherent clot and the reported friability and excessive bleeding following biopsy reported on previous colonoscopy. Biopsies showed inflammatory changes only, no dysplasia. -Transfuse 1 unit PRBC -Monitor CBC every 6 hourly x 3 - GI consult -IV Protonix bolus 80 mg, and drip started in the ED, will continue - Clear liquid diet, n.p.o. midnight  ESRD on  dialysis [facility] [place]) Schedule Monday Wednesday Friday.  Completed dialysis session yesterday.  Receiving 1 unit of blood. -Consult nephrology in the morning for HD  Hypokalemia k - 3. -P.o. KCl 40 mg x 1 - Per HD  Controlled type 2 diabetes mellitus with chronic kidney disease on chronic dialysis, with long-term current use of insulin (HCC) - Blood sugars 332.  - SSI- q4h -On sliding scale at nursing home  Essential hypertension, benign Stable. -Hold diltiazem and metoprolol for now in the setting of GI bleed  Pancytopenia (HCC) Anemia hemoglobin of 7.7, WBC 2.9, platelets 113. - trend  Alcoholic cirrhosis of liver with ascites (HCC) History of hepatic encephalopathy, esophageal and rectal varices.  Abdomen distended today, without tenderness..  Frequent paracentesis.  Last paracentesis 01/15/2022-yielding 800 cc, prior to that 6/22 yielding 2 L. -May benefit from paracentesis prior to discharge     DVT prophylaxis: SCDs Code Status: Full-confirmed with patient at bedside. Family Communication:  None at bedside Disposition Plan: ~ 2 days Consults called: GI  Admission status: Obs tele   Author: Bethena Roys, MD 02/19/2022 6:13 PM  For on call review [email or web address].

## 2022-02-19 NOTE — Assessment & Plan Note (Addendum)
Due to liver cirrhosis Overall stable except Hgb Trend CBC

## 2022-02-19 NOTE — Assessment & Plan Note (Addendum)
2 days duration, with symptomatic anemia, dizziness, hemoglobin down to 7.7, Reports melena also.  Underwent EGD 9/8 - findings of G1 esoph varices; portal HTN gastropathy; red blood in posterior gastric body  tx with APC/hemoclip.  Denies hematemesis or abdominal pain. -Last colonoscopy 12/2020-sigmoid and descending colon diverticulosis, polyps, rectal varices,Benign polypoid lesion at the splenic flexure, likely inflammatory polyp. This was felt to be the source of bleeding given the adherent clot and the reported friability and excessive bleeding following biopsy reported on previous colonoscopy. Biopsies showed inflammatory changes only, no dysplasia. GI consulted>>no further endoscopy at this time>>plan outpt TIPS

## 2022-02-19 NOTE — Assessment & Plan Note (Addendum)
Stable. -Hold diltiazem and metoprolol for now in the setting of GI bleed initially -restart lower dose metoprolol -restart diltiazem at d/c

## 2022-02-19 NOTE — Assessment & Plan Note (Addendum)
k - 3 at time of presentation -correction on HD

## 2022-02-19 NOTE — Assessment & Plan Note (Addendum)
Spouse related that she dispenses Tresiba prn when sugars are >250 --also states he had been prone to hypoglycemic events --07/29/21 A1C--5.5 --Tyler Aas was held at SNF>>restart after return home -monitor for now

## 2022-02-19 NOTE — Assessment & Plan Note (Addendum)
History of hepatic encephalopathy, esophageal and rectal varices.  Last paracentesis 01/15/2022-yielding 800 cc, prior to that 6/22 yielding 2 L. -May benefit from paracentesis prior to discharge -no Etoh since 2014

## 2022-02-19 NOTE — ED Provider Notes (Addendum)
[facility] EMERGENCY DEPARTMENT Provider Note   CSN: [id number] Arrival date & time: 02/19/22  1327     History  Chief Complaint  Patient presents with   Abdominal Pain    KAREN KINNARD is a 76 y.o. male.  Pt with hx cirrhosis, gi bleeding, presents indicating in past day did have dark blood per rectum. No rectal pain. Feels mildly weak/lightheaded. No abd pain or nausea/vomiting. States compliant w normal meds. No nsaid use. No anticoag use. Remote hx etoh, none recent. Recent admission with EGD with portal gastropathy, grade 1 varices.   The history is provided by the patient, medical records and the EMS personnel.  Abdominal Pain Associated symptoms: no chest pain, no chills, no cough, no fever, no hematuria, no nausea, no shortness of breath, no sore throat and no vomiting        Home Medications Prior to Admission medications   Medication Sig Start Date End Date Taking? Authorizing Provider  acetaminophen (TYLENOL) 650 MG CR tablet Take 650 mg by mouth every 8 (eight) hours as needed for pain.    [provider]  Alcohol Swabs (ALCOHOL PREP) 70 % PADS Apply 1 each topically 3 (three) times daily. 01/31/20   [provider]  allopurinol (ZYLOPRIM) 300 MG tablet Take 300 mg by mouth daily. 12/11/21   [provider]  ALPRAZolam Duanne Moron) 0.5 MG tablet Take 1 tablet (0.5 mg total) by mouth See admin instructions. Take one tablet (0.5 mg) by mouth on Monday, Wednesday, Friday before dialysis, may also take one tablet (0.5 mg) daily as needed for anxiety. 02/07/22   Orson Eva, MD  Ascorbic Acid (VITAMIN C) 1000 MG tablet Take 1,000 mg by mouth in the morning.    [provider]  BD PEN NEEDLE MICRO U/F 32G X 6 MM MISC SMARTSIG:SUB-Q Daily PRN 04/23/21   [provider]  calcium carbonate (TUMS - DOSED IN MG ELEMENTAL CALCIUM) 500 MG chewable tablet Chew 1 tablet by mouth 3 (three) times daily with meals.    [provider]  Calcium  Carbonate-Simethicone (ALKA-SELTZER HEARTBURN + GAS) 750-80 MG CHEW Chew 1-2 tablets by mouth 2 (two) times daily as needed (acid reflux).    [provider]  cetirizine (ZYRTEC) 10 MG tablet Take 10 mg by mouth daily. 06/24/21   [provider]  cholecalciferol (VITAMIN D3) 25 MCG (1000 UNIT) tablet Take 1,000 Units by mouth daily.    [provider]  Continuous Blood Gluc Sensor (FREESTYLE LIBRE 2 SENSOR) MISC Apply topically. 10/15/21   [provider]  diltiazem (DILACOR XR) 120 MG 24 hr capsule Take 120 mg by mouth daily. 11/19/21   [provider]  famotidine (PEPCID) 20 MG tablet Take 1 tablet (20 mg total) by mouth 2 (two) times daily. 07/30/21 07/30/22  Barton Dubois, MD  furosemide (LASIX) 40 MG tablet Take 80 mg by mouth daily. 12/29/19   [provider]  Garlic 2841 MG CAPS Take 1,000 mg by mouth daily.    [provider]  GLUCAGEN HYPOKIT 1 MG SOLR injection Inject 1 mg into the muscle once as needed for low blood sugar. 11/28/20   [provider]  HUMALOG KWIKPEN 100 UNIT/ML KwikPen 10-50 Units 3 (three) times daily. Sliding scale 04/23/21   [provider]  hydrOXYzine (ATARAX/VISTARIL) 25 MG tablet Take 25 mg by mouth 3 (three) times daily. For itching 11/23/20   [provider]  iron polysaccharides (NIFEREX) 150 MG capsule Take 150 mg by  mouth daily with supper.    [provider]  lactulose (CHRONULAC) 10 GM/15ML solution Take 15 mLs (10 g total) by mouth 2 (two) times daily. 02/07/22   Orson Eva, MD  metoprolol succinate (TOPROL-XL) 25 MG 24 hr tablet Take 1 tablet (25 mg total) by mouth See admin instructions. Take one tablet (25 mg) by mouth at bedtime every night and take one tablet (25 mg) in morning on  Sun, Tues, Thurs and Sat (non-dialysis days) 04/15/21   Murlean Iba, MD  Milk Thistle 250 MG CAPS Take 250 mg by mouth in the morning, at noon, and at bedtime.    [provider]  mirtazapine (REMERON) 30 MG tablet Take 1 tablet (30 mg total) by mouth at bedtime. Patient taking differently: Take 30 mg by mouth daily with supper. 02/10/20   Roxan Hockey, MD  Multiple Vitamin (MULTIVITAMIN WITH MINERALS) TABS tablet Take 1 tablet by mouth in the morning.     [provider]  omega-3 acid ethyl esters (LOVAZA) 1 g capsule Take 2 g by mouth 2 (two) times daily.  04/07/19   [provider]  ondansetron (ZOFRAN) 4 MG tablet Take 4 mg by mouth every 8 (eight) hours as needed for nausea or vomiting.    [provider]  Ped Vitamins ACD Fl-Iron (TRI-VIT/FLUORIDE/IRON PO) Take 1 tablet by mouth daily. 08/01/19   [provider]  pravastatin (PRAVACHOL) 20 MG tablet Take 20 mg by mouth in the morning.     [provider]  Probiotic Product (PROBIOTIC DAILY PO) Take 1 tablet by mouth in the morning.     [provider]  thiamine (VITAMIN B-1) 100 MG tablet Take 100 mg by mouth daily. 08/27/20   [provider]  triamcinolone cream (KENALOG) 0.1 % Apply 1 application topically 2 (two) times daily as needed (dry skin Itching). 12/30/17   [provider]  venlafaxine (EFFEXOR) 75 MG tablet Take 75 mg by mouth 3 (three) times daily with meals.     [provider]  XIFAXAN 550 MG TABS tablet TAKE 1 TABLET BY MOUTH TWICE DAILY 02/12/22   Annitta Needs, NP      Allergies    Tape    Review of Systems   Review of Systems  Constitutional:  Negative for chills and fever.  HENT:  Negative for sore throat.   Eyes:  Negative for redness.  Respiratory:  Negative for cough and shortness of breath.   Cardiovascular:  Negative for chest pain.  Gastrointestinal:  Positive for blood in stool. Negative for abdominal pain, nausea and vomiting.  Genitourinary:  Negative for flank pain and hematuria.  Musculoskeletal:  Negative for back pain and neck pain.  Skin:  Negative for rash.  Neurological:  Negative  for headaches.  Hematological:  Does not bruise/bleed easily.  Psychiatric/Behavioral:  Negative for confusion.     Physical Exam Updated Vital Signs BP (!) 148/58   Pulse 84   Temp 98.3 F (36.8 C) (Oral)   Resp 18   Ht 1.651 m ('5\' 5"'$ )   Wt 74.8 kg   SpO2 97%   BMI 27.46 kg/m  Physical Exam Vitals and nursing note reviewed.  Constitutional:      Appearance: Normal appearance. He is well-developed.  HENT:     Head: Atraumatic.     Nose: Nose normal.     Mouth/Throat:     Mouth: Mucous membranes are moist.     Pharynx: Oropharynx is clear.  Eyes:     General: No scleral icterus.    Conjunctiva/sclera: Conjunctivae normal.  Neck:     Trachea: No tracheal deviation.  Cardiovascular:     Rate and Rhythm: Normal rate and regular rhythm.     Pulses: Normal pulses.     Heart sounds: Normal heart sounds. No murmur heard.    No friction rub. No gallop.  Pulmonary:     Effort: Pulmonary effort is normal. No accessory muscle usage or respiratory distress.     Breath sounds: Normal breath sounds.  Abdominal:     General: Bowel sounds are normal.     Palpations: Abdomen is soft. There is no mass.     Tenderness: There is no abdominal tenderness. There is no guarding.  Genitourinary:    Comments: No cva tenderness. Dark brown stool - sent for hemoccult.  Musculoskeletal:        General: No swelling.     Cervical back: Normal range of motion and neck supple. No rigidity.  Skin:    General: Skin is warm and dry.     Findings: No rash.  Neurological:     Mental Status: He is alert.     Comments: Alert, speech clear.   Psychiatric:        Mood and Affect: Mood normal.     ED Results / Procedures / Treatments   Labs (all labs ordered are listed, but only abnormal results are displayed) Results for orders placed or performed during the hospital encounter of 02/19/22  Comprehensive metabolic panel  Result Value Ref Range   Sodium 130 (L) 135 - 145 mmol/L   Potassium 3.0  (L) 3.5 - 5.1 mmol/L   Chloride 94 (L) 98 - 111 mmol/L   CO2 25 22 - 32 mmol/L   Glucose, Bld 332 (H) 70 - 99 mg/dL   BUN 20 8 - 23 mg/dL   Creatinine, Ser 2.48 (H) 0.61 - 1.24 mg/dL   Calcium 8.2 (L) 8.9 - 10.3 mg/dL   Total Protein 6.5 6.5 - 8.1 g/dL   Albumin 2.6 (L) 3.5 - 5.0 g/dL   AST 55 (H) 15 - 41 U/L   ALT 53 (H) 0 - 44 U/L   Alkaline Phosphatase 381 (H) 38 - 126 U/L   Total Bilirubin 0.9 0.3 - 1.2 mg/dL   GFR, Estimated 26 (L) >60 mL/min   Anion gap 11 5 - 15  CBC  Result Value Ref Range   WBC 2.9 (L) 4.0 - 10.5 K/uL   RBC 2.42 (L) 4.22 - 5.81 MIL/uL   Hemoglobin 7.7 (L) 13.0 - 17.0 g/dL   HCT 24.3 (L) 39.0 - 52.0 %   MCV 100.4 (H) 80.0 - 100.0 fL   MCH 31.8 26.0 - 34.0 pg   MCHC 31.7 30.0 - 36.0 g/dL   RDW 19.9 (H) 11.5 - 15.5 %   Platelets 113 (L) 150 - 400 K/uL   nRBC 0.0 0.0 - 0.2 %  Protime-INR  Result Value Ref Range   Prothrombin Time 15.3 (H) 11.4 - 15.2 seconds   INR 1.2 0.8 - 1.2  POC occult blood, ED Provider will collect  Result Value Ref Range   Occult Blood positive   Type and screen  Result Value Ref Range   ABO/RH(D) O POS    Antibody Screen NEG    Sample Expiration      02/22/2022,2359 Performed at [facility], [address] [place], [place] [address]    Unit Number [id number]  Blood Component Type RED CELLS,LR    Unit division 00    Status of Unit ALLOCATED    Transfusion Status OK TO TRANSFUSE    Crossmatch Result Compatible    Unit Number [id number]    Blood Component Type RED CELLS,LR    Unit division 00    Status of Unit ALLOCATED    Transfusion Status OK TO TRANSFUSE    Crossmatch Result Compatible   Prepare RBC (crossmatch)  Result Value Ref Range   Order Confirmation      ORDER PROCESSED BY BLOOD BANK Performed at [facility], [address], [place], [place] [address]   BPAM [facility]  Result Value Ref Range   Blood Product Unit Number [id number]    PRODUCT CODE [id number]    Unit Type and Rh 5100    Blood  Product Expiration Date 195093267124    Blood Product Unit Number [id number]    PRODUCT CODE [id number]    Unit Type and Rh 5100    Blood Product Expiration Date 937902409735    CT HEAD WO CONTRAST (5MM)  Result Date: 02/03/2022 CLINICAL DATA:  Initial evaluation for encephalopathy, multiple falls. EXAM: CT HEAD WITHOUT CONTRAST TECHNIQUE: Contiguous axial images were obtained from the base of the skull through the vertex without intravenous contrast. RADIATION DOSE REDUCTION: This exam was performed according to the departmental dose-optimization program which includes automated exposure control, adjustment of the mA and/or kV according to patient size and/or use of iterative reconstruction technique. COMPARISON:  Prior CT from 04/01/2020. FINDINGS: Brain: Age-related cerebral atrophy with chronic small vessel ischemic disease. Small remote left cerebellar infarct noted, stable. No acute intracranial hemorrhage. No acute large vessel territory infarct. No mass lesion, midline shift or mass effect. No hydrocephalus or extra-axial fluid collection. Vascular: No abnormal hyperdense vessel. Scattered vascular calcifications noted within the carotid siphons. Skull: Scalp soft tissues demonstrate no acute finding. Calvarium intact. Sinuses/Orbits: Globes orbital soft tissues within normal limits. Paranasal sinuses and mastoid air cells are clear. Other: None. IMPRESSION: 1. No acute intracranial abnormality. 2. Age-related cerebral atrophy with chronic small vessel ischemic disease, with a small remote left cerebellar infarct. Electronically Signed   By: Jeannine Boga M.D.   On: 02/03/2022 01:49   DG Chest 2 View  Result Date: 02/02/2022 CLINICAL DATA:  Dialysis, missed dialysis, weakness, multiple falls EXAM: CHEST - 2 VIEW COMPARISON:  08/10/2021 FINDINGS: Gross cardiomegaly. Layering bilateral pleural effusions, similar to prior examination. Pulmonary vascular prominence. Osseous structures  unremarkable. IMPRESSION: Gross cardiomegaly with pulmonary vascular prominence and layering bilateral pleural effusions, similar to prior examination. Electronically Signed   By: Delanna Ahmadi M.D.   On: 02/02/2022 15:44     EKG None  Radiology No results found.  Procedures Procedures    Medications Ordered in ED Medications - No data to display  ED Course/ Medical Decision Making/ A&P                           Medical Decision Making Problems Addressed: Hyperglycemia: acute illness or injury Hypoalbuminemia: chronic illness or injury Hypokalemia: acute illness or injury Other cirrhosis of liver ([place]): chronic illness or injury with exacerbation, progression, or side effects of treatment that poses a threat to life or bodily functions Rectal bleeding: acute illness or injury with systemic symptoms that poses a threat to life or bodily functions Stage 4 chronic kidney disease ([place]): chronic illness or injury with exacerbation, progression, or side effects of treatment that poses  a threat to life or bodily functions Symptomatic anemia: acute illness or injury with systemic symptoms  Amount and/or Complexity of Data Reviewed Independent Historian: EMS    Details: hx External Data Reviewed: labs, radiology and notes. Labs: ordered. Decision-making details documented in ED Course. Radiology: independent interpretation performed. Decision-making details documented in ED Course.  Risk Prescription drug management. Decision regarding hospitalization.   Iv ns. Continuous pulse ox and cardiac monitoring. Labs ordered/sent. Imaging ordered.   Reviewed nursing notes and prior charts for additional history. External reports reviewed. Additional history from: EMS.   Cardiac monitor: sinus rhythm, rate 84.  Labs reviewed/interpreted by me - hgb decreased from prior 7. Given rectal bleeding, heme pos stools, symptoms - will transfuse prbcs.   Protonix bolus/gtt.   Recent ct  reviewed/interpreted by me - no hem.   Hospitalists consulted for admission. Discussed pt  - will admit.   CRITICAL CARE RE: acute gi bleeding, symptomatic anemia, emergent prbc transfusion, cirrhosis, hyperglycemia, hypokalemia.  Performed by: Mirna Mires Total critical care time: 40 minutes Critical care time was exclusive of separately billable procedures and treating other patients. Critical care was necessary to treat or prevent imminent or life-threatening deterioration. Critical care was time spent personally by me on the following activities: development of treatment plan with patient and/or surrogate as well as nursing, discussions with consultants, evaluation of patient's response to treatment, examination of patient, obtaining history from patient or surrogate, ordering and performing treatments and interventions, ordering and review of laboratory studies, ordering and review of radiographic studies, pulse oximetry and re-evaluation of patient's condition.           Final Clinical Impression(s) / ED Diagnoses Final diagnoses:  None    Rx / DC Orders ED Discharge Orders     None           Lajean Saver, MD 02/19/22 1552

## 2022-02-19 NOTE — Assessment & Plan Note (Addendum)
Schedule Monday Wednesday Friday.   Completed dialysis 02/18/22  Receiving 1 unit of blood. -appreciate nephrology -due to scheduling and availability--next HD 02/21/22

## 2022-02-20 ENCOUNTER — Encounter (HOSPITAL_COMMUNITY): Payer: Self-pay | Admitting: Internal Medicine

## 2022-02-20 ENCOUNTER — Observation Stay (HOSPITAL_COMMUNITY): Payer: Medicare Other

## 2022-02-20 DIAGNOSIS — J9611 Chronic respiratory failure with hypoxia: Secondary | ICD-10-CM

## 2022-02-20 DIAGNOSIS — Z794 Long term (current) use of insulin: Secondary | ICD-10-CM

## 2022-02-20 DIAGNOSIS — E1122 Type 2 diabetes mellitus with diabetic chronic kidney disease: Secondary | ICD-10-CM | POA: Diagnosis not present

## 2022-02-20 DIAGNOSIS — K625 Hemorrhage of anus and rectum: Secondary | ICD-10-CM | POA: Diagnosis not present

## 2022-02-20 DIAGNOSIS — D62 Acute posthemorrhagic anemia: Secondary | ICD-10-CM | POA: Diagnosis not present

## 2022-02-20 LAB — GLUCOSE, CAPILLARY
Glucose-Capillary: 135 mg/dL — ABNORMAL HIGH (ref 70–99)
Glucose-Capillary: 142 mg/dL — ABNORMAL HIGH (ref 70–99)
Glucose-Capillary: 160 mg/dL — ABNORMAL HIGH (ref 70–99)
Glucose-Capillary: 229 mg/dL — ABNORMAL HIGH (ref 70–99)
Glucose-Capillary: 251 mg/dL — ABNORMAL HIGH (ref 70–99)
Glucose-Capillary: 305 mg/dL — ABNORMAL HIGH (ref 70–99)

## 2022-02-20 LAB — CBC
HCT: 26.5 % — ABNORMAL LOW (ref 39.0–52.0)
HCT: 28.8 % — ABNORMAL LOW (ref 39.0–52.0)
Hemoglobin: 8.6 g/dL — ABNORMAL LOW (ref 13.0–17.0)
Hemoglobin: 9.3 g/dL — ABNORMAL LOW (ref 13.0–17.0)
MCH: 32 pg (ref 26.0–34.0)
MCH: 32.3 pg (ref 26.0–34.0)
MCHC: 32.5 g/dL (ref 30.0–36.0)
MCHC: 32.3 g/dL (ref 30.0–36.0)
MCV: 98.5 fL (ref 80.0–100.0)
MCV: 100 fL (ref 80.0–100.0)
Platelets: 132 10*3/uL — ABNORMAL LOW (ref 150–400)
Platelets: 133 10*3/uL — ABNORMAL LOW (ref 150–400)
RBC: 2.69 MIL/uL — ABNORMAL LOW (ref 4.22–5.81)
RBC: 2.88 MIL/uL — ABNORMAL LOW (ref 4.22–5.81)
RDW: 20 % — ABNORMAL HIGH (ref 11.5–15.5)
RDW: 20.5 % — ABNORMAL HIGH (ref 11.5–15.5)
WBC: 3.3 10*3/uL — ABNORMAL LOW (ref 4.0–10.5)
WBC: 3.8 10*3/uL — ABNORMAL LOW (ref 4.0–10.5)
nRBC: 0 % (ref 0.0–0.2)
nRBC: 0 % (ref 0.0–0.2)

## 2022-02-20 LAB — BASIC METABOLIC PANEL
Anion gap: 10 (ref 5–15)
BUN: 23 mg/dL (ref 8–23)
CO2: 24 mmol/L (ref 22–32)
Calcium: 8.4 mg/dL — ABNORMAL LOW (ref 8.9–10.3)
Chloride: 99 mmol/L (ref 98–111)
Creatinine, Ser: 2.89 mg/dL — ABNORMAL HIGH (ref 0.61–1.24)
GFR, Estimated: 22 mL/min — ABNORMAL LOW (ref >60)
Glucose, Bld: 132 mg/dL — ABNORMAL HIGH (ref 70–99)
Potassium: 3.5 mmol/L (ref 3.5–5.1)
Sodium: 133 mmol/L — ABNORMAL LOW (ref 135–145)

## 2022-02-20 LAB — HEPATITIS C ANTIBODY: HCV Ab: NONREACTIVE

## 2022-02-20 LAB — HEPATITIS B SURFACE ANTIBODY,QUALITATIVE: Hep B S Ab: NONREACTIVE

## 2022-02-20 MED ORDER — METOPROLOL SUCCINATE ER 25 MG PO TB24
25.0000 mg | ORAL_TABLET | Freq: Every day | ORAL | Status: DC
  Administered 2022-02-20 – 2022-02-21 (×2): 25 mg via ORAL
  Filled 2022-02-20 (×2): qty 1

## 2022-02-20 MED ORDER — CHLORHEXIDINE GLUCONATE CLOTH 2 % EX PADS
6.0000 | MEDICATED_PAD | Freq: Every day | CUTANEOUS | Status: DC
  Administered 2022-02-20 – 2022-02-21 (×2): 6 via TOPICAL

## 2022-02-20 NOTE — Assessment & Plan Note (Signed)
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

## 2022-02-20 NOTE — Evaluation (Addendum)
Physical Therapy Evaluation Patient Details Name: Jeffrey [name] MRN: [id number] DOB: 1946-01-16 Today's Date: 02/20/2022  History of Present Illness  Jeffrey [name] is a 76 y.o. male with medical history significant for alcoholic liver cirrhosis, with chronic respiratory failure, ESRD, esophageal varices, hypertension.  Patient presented to the ED with complaints of rectal bleeding that started yesterday.  She reports seeing a large amount of blood in the toilet.  Last episode was today before arrival in the ED. He also reports melena. He denies abdominal pain.  No vomiting of blood.  Not on anticoagulation.  Reports dizziness when standing up.  Chest pain or difficulty breathing.  Hemodialysis Monday Wednesday Friday.  He completed his HD session yesterday.   Clinical Impression  Patient with slight difficulty with supine to sit transfer mostly due to UE weakness requiring use of hand rails for transfer. Patient demonstrated good return with ambulating in room without loss of balance, patient distance limited by having to use restroom.  Patient will benefit from continued skilled physical therapy in hospital and recommended venue below to increase strength, balance, endurance for safe ADLs and gait     Recommendations for follow up therapy are one component of a multi-disciplinary discharge planning process, led by the attending physician.  Recommendations may be updated based on patient status, additional functional criteria and insurance authorization.  Follow Up Recommendations Home health PT Can patient physically be transported by private vehicle: Yes    Assistance Recommended at Discharge Set up Supervision/Assistance  Patient can return home with the following  Help with stairs or ramp for entrance;Assistance with cooking/housework;A little help with walking and/or transfers;A lot of help with bathing/dressing/bathroom    Equipment Recommendations None recommended by PT  Recommendations  for Other Services       Functional Status Assessment Patient has had a recent decline in their functional status and demonstrates the ability to make significant improvements in function in a reasonable and predictable amount of time.     Precautions / Restrictions Precautions Precautions: Fall Restrictions Weight Bearing Restrictions: No      Mobility  Bed Mobility Overal bed mobility: Needs Assistance Bed Mobility: Supine to Sit     Supine to sit: Min assist     General bed mobility comments: unable to use railing to sit up, min assist for supine to sit transfer    Transfers Overall transfer level: Needs assistance Equipment used: Rolling walker (2 wheels) Transfers: Sit to/from Stand, Bed to chair/wheelchair/BSC Sit to Stand: Min guard   Step pivot transfers: Min guard       General transfer comment: increased time, slightly labored movement    Ambulation/Gait Ambulation/Gait assistance: Min guard Gait Distance (Feet): 16 Feet Assistive device: Rolling walker (2 wheels) Gait Pattern/deviations: Decreased step length - left, Decreased stance time - right, Decreased stride length, Decreased dorsiflexion - right, Decreased dorsiflexion - left Gait velocity: decreased     General Gait Details: slightly labored movement without loss of balance, limited mostly due to having to use restroom  Stairs            Wheelchair Mobility    Modified Rankin (Stroke Patients Only)       Balance Overall balance assessment: Needs assistance Sitting-balance support: Feet supported, No upper extremity supported Sitting balance-Leahy Scale: Good Sitting balance - Comments: seated at EOB   Standing balance support: During functional activity, Bilateral upper extremity supported Standing balance-Leahy Scale: Fair Standing balance comment: fair/good using RW  Pertinent Vitals/Pain Pain Assessment Pain Assessment: No/denies  pain    Home Living Family/patient expects to be discharged to:: Private residence Living Arrangements: Spouse/significant other Available Help at Discharge: Family;Available PRN/intermittently Type of Home: House Home Access: Ramped entrance       Home Layout: Able to live on main level with bedroom/bathroom Home Equipment: Rolling Walker (2 wheels);Rollator (4 wheels);Cane - single point;Shower seat - built in;Grab bars - tub/shower;BSC/3in1      Prior Function Prior Level of Function : Independent/Modified Independent             Mobility Comments: household ambulator using RW, uses 2-3 LPM O2 constant at home ADLs Comments: assisted by family     Hand Dominance   Dominant Hand: Right    Extremity/Trunk Assessment   Upper Extremity Assessment Upper Extremity Assessment: Generalized weakness    Lower Extremity Assessment Lower Extremity Assessment: Generalized weakness    Cervical / Trunk Assessment Cervical / Trunk Assessment: Kyphotic  Communication   Communication: No difficulties  Cognition Arousal/Alertness: Awake/alert Behavior During Therapy: WFL for tasks assessed/performed Overall Cognitive Status: Within Functional Limits for tasks assessed                                          General Comments      Exercises     Assessment/Plan    PT Assessment Patient needs continued PT services  PT Problem List Decreased strength;Decreased activity tolerance;Decreased balance;Decreased mobility       PT Treatment Interventions DME instruction;Gait training;Stair training;Functional mobility training;Therapeutic activities;Therapeutic exercise;Patient/family education;Balance training    PT Goals (Current goals can be found in the Care Plan section)  Acute Rehab PT Goals Patient Stated Goal: return home with family to assist PT Goal Formulation: With patient Time For Goal Achievement: 03/06/22 Potential to Achieve Goals: Good     Frequency Min 3X/week     Co-evaluation               AM-PAC PT "6 Clicks" Mobility  Outcome Measure Help needed turning from your back to your side while in a flat bed without using bedrails?: None Help needed moving from lying on your back to sitting on the side of a flat bed without using bedrails?: A Little Help needed moving to and from a bed to a chair (including a wheelchair)?: A Little Help needed standing up from a chair using your arms (e.g., wheelchair or bedside chair)?: A Little Help needed to walk in hospital room?: A Little Help needed climbing 3-5 steps with a railing? : A Lot 6 Click Score: 18    End of Session Equipment Utilized During Treatment: Oxygen Activity Tolerance: Patient tolerated treatment well;Patient limited by fatigue Patient left: with call bell/phone within reach;Other (comment) (left sitting on comode in bathroom) Nurse Communication: Mobility status PT Visit Diagnosis: Unsteadiness on feet (R26.81);Other abnormalities of gait and mobility (R26.89);Muscle weakness (generalized) (M62.81)    Time: 8341-9622 PT Time Calculation (min) (ACUTE ONLY): 30 min   Charges:   PT Evaluation $PT Eval Moderate Complexity: 1 Mod PT Treatments $Therapeutic Activity: 23-37 mins        Zigmund Gottron, SPT   12:32 PM, 02/20/22 Lonell Grandchild, MPT Physical Therapist with [facility] [phone] office [phone] mobile phone  During this treatment session, the therapist was present, participating in and directing the treatment.

## 2022-02-20 NOTE — TOC Initial Note (Addendum)
Transition of Care [facility]) - Initial/Assessment Note    Patient Details  Name: Jeffrey [name] MRN: [id number] Date of Birth: May 07, 1946  Transition of Care [facility]) CM/SW Contact:    Shade Flood, LCSW Phone Number: 02/20/2022, 1:17 PM  Clinical Narrative:                  Pt admitted from [facility] where he has been for approximately two weeks. PT evaluation has been completed and recommendation is for HH at dc. Met with pt at bedside and called pt's wife on speaker phone to review dc planning. MD anticipating pt will remain hospitalized for a couple more days. Discussed PT recommendation with pt and his wife and they are agreeable to this plan. They inform TOC that [facility] had been anticipating dc from rehab shortly and that they had already referred pt to a [facility] agency for services at home. They were unsure which [facility] agency but are agreeable to continue referral with whichever agency it is. Pt's wife states that pt has all DME needed at home including a walker, shower chair, elevated toilet seat, and shower chair. They are not anticipating any other TOC needs for dc.  Contacted Alison at [facility] and was informed that the [facility] referral was given to [facility]. Updated Marjory Lies from [place] of above.   Updated MD on DC plan and need for HH orders at dc. TOC will follow.  1515: Linda from [place] notified this LCSW that they have an active referral on this patient. Updated Marjory Lies at [facility] that [facility] would provide care at dc.  Expected Discharge Plan: [place] Barriers to Discharge: Continued Medical Work up   Patient Goals and CMS Choice Patient states their goals for this hospitalization and ongoing recovery are:: go home CMS Medicare.gov Compare Post Acute Care list provided to:: Patient Choice offered to / list presented to : Patient  Expected Discharge Plan and Services Expected Discharge Plan: [place] In-house Referral: Clinical  Social Work   Post Acute Care Choice: [place] arrangements for the past 2 months: [place]                                      Prior Living Arrangements/Services Living arrangements for the past 2 months: Single Family Home Lives with:: Spouse Patient language and need for interpreter reviewed:: Yes Do you feel safe going back to the place where you live?: Yes      Need for Family Participation in Patient Care: Yes (Comment) Care giver support system in place?: Yes (comment) Current home services: DME Criminal Activity/Legal Involvement Pertinent to Current Situation/Hospitalization: No - Comment as needed  Activities of Daily Living Home Assistive Devices/Equipment: Bedside commode/3-in-1, Cane (specify quad or straight), Dentures (specify type), Eyeglasses, Grab bars around toilet, Grab bars in shower, Oxygen, Raised toilet seat with rails, Shower chair with back, Environmental consultant (specify type), Wheelchair ADL Screening (condition at time of admission) Patient's cognitive ability adequate to safely complete daily activities?: Yes Is the patient deaf or have difficulty hearing?: Yes Does the patient have difficulty seeing, even when wearing glasses/contacts?: No Does the patient have difficulty concentrating, remembering, or making decisions?: Yes Patient able to express need for assistance with ADLs?: Yes Does the patient have difficulty dressing or bathing?: No Independently performs ADLs?: No Communication: Independent Dressing (OT): Independent Grooming: Independent Feeding: Independent  Bathing: Needs assistance Is this a change from baseline?: Pre-admission baseline Toileting: Needs assistance Is this a change from baseline?: Change from baseline, expected to last <3 days In/Out Bed: Needs assistance Is this a change from baseline?: Change from baseline, expected to last <3 days Walks in Home: Independent with device (comment) Does the patient have  difficulty walking or climbing stairs?: Yes Weakness of Legs: Both Weakness of Arms/Hands: None  Permission Sought/Granted Permission sought to share information with : Facility Industrial/product designer granted to share information with : Yes, Verbal Permission Granted     Permission granted to share info w AGENCY: HH        Emotional Assessment Appearance:: Appears stated age Attitude/Demeanor/Rapport: Engaged Affect (typically observed): Pleasant Orientation: : Oriented to Self, Oriented to Place, Oriented to  Time, Oriented to Situation Alcohol / Substance Use: Not Applicable Psych Involvement: No (comment)  Admission diagnosis:  Hypokalemia [E87.6] Hypoalbuminemia [E88.09] Rectal bleeding [K62.5] Hyperglycemia [R73.9] Symptomatic anemia [D64.9] Other cirrhosis of liver (HCC) [K74.69] Stage 4 chronic kidney disease (HCC) [N18.4] Patient Active Problem List   Diagnosis Date Noted   Rectal bleeding 02/19/2022   UTI (urinary tract infection) due to urinary indwelling Foley catheter (HCC) 02/05/2022   Symptomatic anemia 07/28/2021   Hypokalemia 07/28/2021   Hypomagnesemia 07/28/2021   Rectal varices    Hyponatremia 01/17/2021   Thrombocytopenia (HCC) 01/17/2021   B12 deficiency 11/21/2020   Iron deficiency anemia due to chronic blood loss 06/25/2020   Gastroesophageal reflux disease 06/06/2020   Bacteremia due to Gram-positive bacteria 04/03/2020   Acute on chronic respiratory failure with hypoxia and hypercapnia (HCC) 04/03/2020   Lobar pneumonia (HCC) 04/03/2020   Volume overload 04/02/2020   Asymptomatic bacteriuria 04/02/2020   Pain of upper abdomen 02/22/2020   Urine leukocytes increased 02/22/2020   Falls    Minor head injury    Physical deconditioning    Acute hyponatremia 02/19/2020   Fever and chills 02/09/2020   Advanced care planning/counseling discussion    DNR (do not resuscitate)    Alcoholic cirrhosis of liver (HCC) 02/08/2020   Back pain  02/08/2020   Status post thoracentesis    Acute metabolic encephalopathy 01/01/2020   Bilateral pleural effusion 12/31/2019   Hypoglycemia 12/31/2019   Hyperammonemia (HCC) 12/31/2019   Class 2 obesity 12/31/2019   Hypoalbuminemia 12/31/2019   Nosebleed    Goals of care, counseling/discussion    Palliative care by specialist    Acute hepatic encephalopathy (HCC) 12/13/2019   Encephalopathy acute    Hyperkalemia    Chronic hyponatremia    Anemia 11/16/2019   Acute blood loss anemia 09/20/2019   GI bleed    Generalized weakness 09/18/2019   Controlled type 2 diabetes mellitus with chronic kidney disease on chronic dialysis, with long-term current use of insulin (HCC) 09/18/2019   ESRD on dialysis (HCC) 09/18/2019   Clostridioides difficile infection 08/31/2019   Edema 04/11/2019   Alcoholic cirrhosis (HCC) 11/25/2017   Essential hypertension, benign 10/20/2017   Morbid obesity (HCC) 10/20/2017   Mixed hyperlipidemia 10/20/2017   Ascites    SBP (spontaneous bacterial peritonitis) (HCC) 01/12/2016   Shigella dysenteriae 01/11/2016   Diarrhea in adult patient 01/10/2016   Liver cirrhosis (HCC) 09/05/2012   Alcoholic cirrhosis of liver with ascites (HCC) 03/09/2012   Heme positive stool 03/09/2012   Anemia due to chronic blood loss 03/09/2012   Pancytopenia (HCC) 03/09/2012   FH: colon cancer 03/09/2012   Esophageal varices in alcoholic cirrhosis (HCC) 03/09/2012   PCP:  Monico Blitz, MD Pharmacy:   [place], [place] [address] [place] [address] Phone: [phone] Fax: [phone]  [place], [place] - [address] [place] [address] Phone: [phone] Fax: [phone]     Social Determinants of Health (SDOH) Interventions    Readmission Risk Interventions    02/07/2022   12:34 PM 01/20/2021    2:33 PM 04/03/2020   10:46 AM  Readmission Risk Prevention Plan   Transportation Screening Complete Complete Complete  PCP or Specialist Appt within 3-5 Days Complete Complete   HRI or [place] Complete Complete   Social Work Consult for [place] Planning/Counseling Complete Complete   Palliative Care Screening Not Applicable Not Applicable   Medication Review Press photographer) Complete Complete Complete  HRI or Home Care Consult   Complete  SW Recovery Care/Counseling Consult   Complete  [place]   Not Applicable

## 2022-02-20 NOTE — Plan of Care (Addendum)
  Problem: Acute Rehab PT Goals(only PT should resolve) Goal: Pt Will Go Supine/Side To Sit 02/20/2022 1339 by Zigmund Gottron, Student-PT Outcome: Progressing Flowsheets (Taken 02/20/2022 1339) Pt will go Supine/Side to Sit:  with min guard assist  with minimal assist Goal: Patient Will Transfer Sit To/From Stand 02/20/2022 1338 by Zigmund Gottron, Student-PT Outcome: Progressing 02/20/2022 1339 by Zigmund Gottron, [place] (Taken 02/20/2022 1338) Patient will transfer sit to/from stand: with supervision Goal: Pt Will Transfer Bed To Chair/Chair To Bed 02/20/2022 1341 by Zigmund Gottron, Student-PT Outcome: Progressing 02/20/2022 1339 by Zigmund Gottron, Student-PT Flowsheets (Taken 02/20/2022 1339) Pt will Transfer Bed to Chair/Chair to Bed:  min guard assist  with min assist Goal: Pt Will Ambulate 02/20/2022 1341 by Zigmund Gottron, Student-PT Outcome: Progressing 02/20/2022 1339 by Zigmund Gottron, Student-PT Flowsheets (Taken 02/20/2022 1339) Pt will Ambulate:  with min guard assist  50 feet  with rolling walker   Zigmund Gottron, SPT  During this treatment session, the therapist was present, participating in and directing the treatment.  This licensed practitioner was present in the room guiding the student in service delivery. Therapy student was participating in the provision of services, and the practitioner was not engaged in treating another patient or doing other tasks at the same time.   2:07 PM, 02/20/22 Lonell Grandchild, MPT Physical Therapist with [facility] [phone] office [phone] mobile phone

## 2022-02-20 NOTE — Progress Notes (Signed)
Pt had a small BM with blood noted

## 2022-02-20 NOTE — Progress Notes (Signed)
PROGRESS NOTE  Jeffrey [name] ZOX:[id number] DOB: [birth year] DOA: 02/19/2022 PCP: Monico Blitz, MD  Brief History:  76 y.o. 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 Last episode on day of admission. He also reports melena. He denies abdominal pain.  No vomiting of blood.  Not on anticoagulation.  Reports dizziness when standing up.  He denies f/c, cp, sob, hemoptysis.   He was recently admitted from 02/02/22 to 02/07/22 for acute metabolic encephalopathy due to UTI, hyperammonemia and ABLA.  He had EGD on 02/06/22 showing G1 esoph varices; portal HTN gastropathy; red blood in posterior gastric body  tx with APC/hemoclip.  He was transfused with 2 units PRBC.  His Hgb was 9.9 at the time of d/c  ED Course: Blood pressure 140s to 150s.  Heart rate 80s.  Hemoglobin down to 7.7 down from last check of 9.9  - 9/8.  1 unit of blood ordered for transfusion.  Hospitalist admit for GI bleed. GI consulted to assist with management.    Assessment and Plan: * Rectal bleeding 2 days duration, with symptomatic anemia, dizziness, hemoglobin down to 7.7, Reports melena also.  Underwent EGD 9/8 - findings of G1 esoph varices; portal HTN gastropathy; red blood in posterior gastric body  tx with APC/hemoclip.  Denies hematemesis or abdominal pain. -Last colonoscopy 12/2020-sigmoid and descending colon diverticulosis, polyps, rectal varices,Benign polypoid lesion at the splenic flexure, likely inflammatory polyp. This was felt to be the source of bleeding given the adherent clot and the reported friability and excessive bleeding following biopsy reported on previous colonoscopy. Biopsies showed inflammatory changes only, no dysplasia.  Acute blood loss  anemia Presented with Hgb 7.7 Baseline Hgb 8-9 Due to portal HTN gastropathy vs gastric AVM 9/8 EGD-- G1 esoph varices; portal HTN gastropathy; red blood in posterior gastric body  tx with APC/hemoclip Transfused 1 units this admit    Alcoholic cirrhosis of liver with ascites ([place]) History of hepatic encephalopathy, esophageal and rectal varices.  Last paracentesis 01/15/2022-yielding 800 cc, prior to that 6/22 yielding 2 L. -May benefit from paracentesis prior to discharge -no Etoh since 2014  ESRD on dialysis [facility]) Schedule Monday Wednesday Friday.   Completed dialysis 02/18/22  Receiving 1 unit of blood. -appreciate nephrology -due to scheduling and availability--next HD 02/21/22  Falls Multiple falls and generalized weakness.  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 -Hold diltiazem and metoprolol for now in the setting of GI bleed initially -restart lower dose metoprolol      Family Communication:   spouse updated 9/22  Consultants:  GI, renal  Code Status:  FULL   DVT Prophylaxis:  SCDs   Procedures: As Listed in Progress  Note Above  Antibiotics: None     Subjective: No hematochezia today.  Denies cp, sob, n/v/d, abd pain, f/c  Objective: Vitals:   02/19/22 2019 02/20/22 0023 02/20/22 0700 02/20/22 1308  BP: (!) 160/61 (!) 151/71  (!) 159/70 (!) 140/70  Pulse: 87 82 88 87  Resp: '16 16 16   '$ Temp: 98.5 F (36.9 C) 97.7 F (36.5 C) 98.4 F (36.9 C) 97.7 F (36.5 C)  TempSrc:  Oral Oral Axillary  SpO2: 100% 98% 99% 100%  Weight:      Height:        Intake/Output Summary (Last 24 hours) at 02/20/2022 1425 Last data filed at 02/20/2022 0900 Gross per 24 hour  Intake 757.39 ml  Output 300 ml  Net 457.39 ml   Weight change:  Exam:  General:  Pt is alert, follows commands appropriately, not in acute distress HEENT: No icterus, No thrush, No neck mass, [place]/AT Cardiovascular: RRR, S1/S2, no rubs, no gallops Respiratory: bibasilar crackles. No wheeze Abdomen: Soft/+BS, non tender,+distended, no guarding Extremities: trace LE edema, No lymphangitis, No petechiae, No rashes, no synovitis   Data Reviewed: I have personally reviewed following labs and imaging studies Basic Metabolic Panel: Recent Labs  Lab 02/19/22 1421 02/19/22 1558 02/20/22 0613  NA 130*  --  133*  K 3.0*  --  3.5  CL 94*  --  99  CO2 25  --  24  GLUCOSE 332*  --  132*  BUN 20  --  23  CREATININE 2.48*  --  2.89*  CALCIUM 8.2*  --  8.4*  MG  --  1.7  --    Liver Function Tests: Recent Labs  Lab 02/19/22 1421  AST 55*  ALT 53*  ALKPHOS 381*  BILITOT 0.9  PROT 6.5  ALBUMIN 2.6*   No results for input(s): "LIPASE", "AMYLASE" in the last 168 hours. No results for input(s): "AMMONIA" in the last 168 hours. Coagulation Profile: Recent Labs  Lab 02/19/22 1421  INR 1.2   CBC: Recent Labs  Lab 02/19/22 1421 02/19/22 2149 02/20/22 0614  WBC 2.9* 3.2* 3.3*  HGB 7.7* 8.4* 8.6*  HCT 24.3* 25.9* 26.5*  MCV 100.4* 98.9 98.5  PLT 113* 124* 132*   Cardiac Enzymes: No results for input(s): "CKTOTAL", "CKMB", "CKMBINDEX", "TROPONINI" in the last 168 hours. BNP: Invalid input(s): "POCBNP" CBG: Recent Labs  Lab 02/19/22 2151 02/20/22 0021 02/20/22 0336 02/20/22 0729  GLUCAP 239* 251* 142* 135*   HbA1C: No results for  input(s): "HGBA1C" in the last 72 hours. Urine analysis:    Component Value Date/Time   COLORURINE YELLOW 02/02/2022 1327   APPEARANCEUR CLOUDY (A) 02/02/2022 1327   LABSPEC 1.011 02/02/2022 1327   PHURINE 5.0 02/02/2022 1327   GLUCOSEU NEGATIVE 02/02/2022 1327   HGBUR MODERATE (A) 02/02/2022 1327   BILIRUBINUR NEGATIVE 02/02/2022 1327   KETONESUR NEGATIVE 02/02/2022 1327   PROTEINUR 100 (A) 02/02/2022 1327   NITRITE NEGATIVE 02/02/2022 1327   LEUKOCYTESUR LARGE (A) 02/02/2022 1327   Sepsis Labs: '@LABRCNTIP'$ (procalcitonin:4,lacticidven:4) )No results found for this or any previous visit (from the past 240 hour(s)).   Scheduled Meds:  allopurinol  300 mg Oral Daily   ALPRAZolam  0.5 mg Oral Q M,W,F-HD   calcium carbonate  1 tablet Oral TID WC   Chlorhexidine Gluconate Cloth  6 each Topical Daily   Chlorhexidine Gluconate Cloth  6 each Topical Q0600   furosemide  80 mg Oral Daily   insulin aspart  0-9 Units Subcutaneous Q4H  lactulose  10 g Oral BID   mirtazapine  30 mg Oral QHS   [START ON 02/23/2022] pantoprazole  40 mg Intravenous Q12H   rifaximin  550 mg Oral BID   venlafaxine  75 mg Oral TID WC   Continuous Infusions:  pantoprazole 8 mg/hr (02/20/22 0030)    Procedures/Studies: CT HEAD WO CONTRAST (5MM)  Result Date: 02/03/2022 CLINICAL DATA:  Initial evaluation for encephalopathy, multiple falls. EXAM: CT HEAD WITHOUT CONTRAST TECHNIQUE: Contiguous axial images were obtained from the base of the skull through the vertex without intravenous contrast. RADIATION DOSE REDUCTION: This exam was performed according to the departmental dose-optimization program which includes automated exposure control, adjustment of the mA and/or kV according to patient size and/or use of iterative reconstruction technique. COMPARISON:  Prior CT from 04/01/2020. FINDINGS: Brain: Age-related cerebral atrophy with chronic small vessel ischemic disease. Small remote left cerebellar infarct noted,  stable. No acute intracranial hemorrhage. No acute large vessel territory infarct. No mass lesion, midline shift or mass effect. No hydrocephalus or extra-axial fluid collection. Vascular: No abnormal hyperdense vessel. Scattered vascular calcifications noted within the carotid siphons. Skull: Scalp soft tissues demonstrate no acute finding. Calvarium intact. Sinuses/Orbits: Globes orbital soft tissues within normal limits. Paranasal sinuses and mastoid air cells are clear. Other: None. IMPRESSION: 1. No acute intracranial abnormality. 2. Age-related cerebral atrophy with chronic small vessel ischemic disease, with a small remote left cerebellar infarct. Electronically Signed   By: Jeannine Boga M.D.   On: 02/03/2022 01:49   DG Chest 2 View  Result Date: 02/02/2022 CLINICAL DATA:  Dialysis, missed dialysis, weakness, multiple falls EXAM: CHEST - 2 VIEW COMPARISON:  08/10/2021 FINDINGS: Gross cardiomegaly. Layering bilateral pleural effusions, similar to prior examination. Pulmonary vascular prominence. Osseous structures unremarkable. IMPRESSION: Gross cardiomegaly with pulmonary vascular prominence and layering bilateral pleural effusions, similar to prior examination. Electronically Signed   By: Delanna Ahmadi M.D.   On: 02/02/2022 15:44    Orson Eva, DO  [facility]  If 7PM-7AM, please contact night-coverage [email or web address] Password [email or web address] 02/20/2022, 2:25 PM   LOS: 0 days

## 2022-02-20 NOTE — Assessment & Plan Note (Signed)
Multiple falls and generalized weakness.  -CT head no acute findings  -no new reported falls since d/c to SNF PT eval>>home with [facility]

## 2022-02-20 NOTE — Care Management Obs Status (Signed)
[place] NOTIFICATION   Patient Details  Name: Jeffrey [name] MRN: [id number] Date of Birth: May 01, 1946   Medicare Observation Status Notification Given:  Yes (letter reviewed with wife by telephone,copy mailed to address on file due to cnotact precautions)    [name] 02/20/2022, 3:57 PM

## 2022-02-20 NOTE — Hospital Course (Addendum)
76 y.o. 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 Last episode on day of admission. He also reports melena. He denies abdominal pain.  No vomiting of blood.  Not on anticoagulation.  Reports dizziness when standing up.  He denies f/c, cp, sob, hemoptysis.   He was recently admitted from 02/02/22 to 02/07/22 for acute metabolic encephalopathy due to UTI, hyperammonemia and ABLA.  He had EGD on 02/06/22 showing G1 esoph varices; portal HTN gastropathy; red blood in posterior gastric body  tx with APC/hemoclip.  He was transfused with 2 units PRBC.  His Hgb was 9.9 at the time of d/c  ED Course: Blood pressure 140s to 150s.  Heart rate 80s.  Hemoglobin down to 7.7 down from last check of 9.9  - 9/8.  1 unit of blood ordered for transfusion.  Hospitalist admit for GI bleed. GI consulted to assist with management.

## 2022-02-20 NOTE — Progress Notes (Signed)
PT tolerated left sided paracentesis procedure well today and 3.4 Liters of hazy yellow colored ascites removed. PT verbalized understanding of post procedure instructions and taken via wheelchair back to inpatient bed assignment at this time with no acute distress noted.

## 2022-02-20 NOTE — Consult Note (Addendum)
Gastroenterology Consult   Referring Provider: No ref. provider found Primary Care Physician:  Monico Blitz, MD Primary Gastroenterologist:  Dr. Gala Romney  Patient ID: Jeffrey [name]; [id number]; Aug 04, 1945   Admit date: 02/19/2022  LOS: 0 days   Date of Consultation: 02/20/2022  Reason for Consultation:  rectal bleeding, anemia, liver disease with rectal and esophageal varices  History of Present Illness   Jeffrey [name] is a 76 y.o. 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  Recently admitted for the same 9/4 - 9/9.  ED course: Vital signs stable Hemoglobin 7.7 (9.9 on 9/8).  MCV 100.  Given 1 unit PRBC Potassium 3, sodium 130, glucose 332, creatinine 2.48, albumin 2.6, AST 55, ALT 53, alk phos 381, INR 1.2 Stool Hemoccult positive   Consult: Started having black stools about 2 days ago. No BRBPR. Has been [facility] since his last discharge from the hospital getting physical therapy and showed his stool to the nurse there and then he was sent here. Uses oxygen at home when he lays down and with extra movement. Has shortness of breath at baseline, has been slightly worse. Does have some weakness. Has not had any abdominal pain, nausea/vomiting, dysphagia, lack of appetite, early satiety. Denies any recent fogginess or confusion recently. Does have some abdominal fullness, reports his last paracentesis was a little over a month ago. Does report some intermittent pruritis. Did well with dialysis the other day. Has been able to get around his room at the SNF without much difficulty. No diarrhea. Typically having 1-2 BMs a day. Typically only takes the lactulose on days he does not have dialysis. Still taking the  Xifaxin.   Prior endoscopic work up: EGD 02/06/2022: Grade 1 esophageal varices, portal hypertensive gastropathy, blood in the gastric body (posterior wall) treated with APC therapy, clip placed, duodenitis.  Advised pantoprazole 40 mg twice daily.  EGD December 2022: Stigmata of prior esophageal band ligation with no significant varices seen.  Slightly denuded distal esophageal mucosa.  Portal hypertensive gastropathy.  Polypoid gastric and duodenal mucosa with overlying erosions.  Marked mucosal friability  EGD 01/16/2021: Portal hypertensive gastropathy, grade 2 esophageal varices with bleeding stigmata s/p banding x4, slight nodularity and congestion involving bulb and second portion of duodenum, nonspecific.  Repeat EGD in 4 weeks.  Colonoscopy 01/16/2021: Portal colopathy, large rectal varices, polypoid mass at splenic flexure biopsy,two 1 cm left colon polyps not removed, left-sided diverticulosis.  Reported patient had bleeding from the splenic flexure lesion.  Pathology with acute inflamed colonic mucosa with localized ulceration and prominent reactive changes.  Colonoscopy 01/19/2021: 15 mm polyp in proximal descending colon removed piecemeal using hot snare, 6 mm polyp in the ascending colon, benign polypoid lesion at splenic flexure which was likely source of bleeding given adherent clot and reported friability and excessive bleeding following previous biopsy, 12 mm polyp in sigmoid colon, diverticulosis in the sigmoid and descending colon, rectal varices.  Other polyps were present and not removed.  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  Recommended surveillance in 3 years.   Past Medical History:  Diagnosis Date   A-fib [facility]    when initially stating dialysis january 2021 at [facility] per spouse; never heard anything else about it  Alcoholic cirrhosis ([place])    patient reports completing hep a and b vaccines in 2006    Anemia    has had 3 units of prbcs 2013   Anxiety    Arthritis    Asymptomatic gallstones    Ultrasound in 2006   B12 deficiency 11/21/2020   C. difficile colitis    Cholelithiasis    Chronic kidney disease    Dialysis M/W/F/Sa   Depression    Diabetes ([place])    Duodenal ulcer with hemorrhage    per patient in 2006 or 2007, records have been requested   Dyspnea    low oxygen level -  has O2 2 L all day   Esophageal varices (HCC)    see [facility]   GERD (gastroesophageal reflux disease)    Heart burn    History of alcohol abuse    quit 05/2013   HOH (hard of hearing)    Hypertension    Liver cirrhosis ([place])    ETOH   SBP (spontaneous bacterial peritonitis) ([place])    2020, November 2021.    Splenomegaly    Ultrasound in 2006    Past Surgical History:  Procedure Laterality Date   AGILE CAPSULE N/A 09/20/2019   Procedure: AGILE CAPSULE;  Surgeon: Daneil Dolin, MD;  [facility];  Service: Endoscopy;  Laterality: N/A;   APPENDECTOMY     AV FISTULA PLACEMENT Left 08/17/2019   Procedure: ARTERIOVENOUS (AV) FISTULA CREATION VERSES ARTERIOVENOUS GRAFT;  Surgeon: [name], MD;  Location: [facility];  Service: Vascular;  Laterality: Left;   BASCILIC VEIN TRANSPOSITION Left 09/28/2019   Procedure: LEFT SECOND STAGE [place];  Surgeon: [name], MD;  Location: [place];  Service: Vascular;  Laterality: Left;   BIOPSY  02/03/2018   Procedure: BIOPSY;  Surgeon: Daneil Dolin, MD;  [facility];  Service: Endoscopy;;  bx of gastric polyps   BIOPSY  09/19/2019   Procedure: BIOPSY;  Surgeon: Danie Binder, MD;  [facility];  Service: Endoscopy;;   BIOPSY  01/16/2021   Procedure: BIOPSY;  Surgeon: Daneil Dolin, MD;  [facility];  Service: Endoscopy;;   CATARACT EXTRACTION Right    COLONOSCOPY  03/10/2005   Rectal polyp as described above, removed with snare. Left sided  diverticula. The remainder of the colonic mucosa  appeared normal. Inflammed polyp on path.   COLONOSCOPY  04/1999   Dr. Thea Silversmith polyps removed,  Path showed hyperplastic   COLONOSCOPY  10/2015   Dr. Britta Mccreedy: diverticulosis, single sessile tubular adenoma 3-35m in size removed from descending colon.    COLONOSCOPY WITH ESOPHAGOGASTRODUODENOSCOPY (EGD)  03/31/2012   [id number]. Colonic diverticulosis. tubular adenoma colon   COLONOSCOPY WITH ESOPHAGOGASTRODUODENOSCOPY (EGD)  06/2019   FORSYTH: small esophageal varices without high risk stigmata, single large AVM on lesser curvature in gastric body s/p APC ablation, gastric antral and duodenal bulb polyposis. No significant source to explain transfusion dependent anemia. Colonoscopy with portal colopathy and diffuse edema, changes of Cdiff colitis on colonoscopy   COLONOSCOPY WITH PROPOFOL N/A 01/19/2021   Surgeon: CDaryel November MD; 15 mm polyp in descending colon removed piecemeal, 6 mm polyp in ascending colon and 12 mm polyp in sigmoid colon not removed due to high bleeding risk, benign polypoid lesion at splenic flexure, diverticulosis in sigmoid and descending colon, rectal varices.   COLONOSCOPY WITH PROPOFOL N/A 01/16/2021   Surgeon: RDaneil Dolin MD;  Portal colopathy, large rectal  varices, polypoid mass at splenic flexure biopsy, two 1 cm left colon polyps not removed, left-sided diverticulosis.  Reported patient had bleeding from splenic flexure lesion.  Pathology with acutely inflamed colonic mucosa with localized ulceration and prominent reactive changes.   ESOPHAGEAL BANDING N/A 02/03/2018   Procedure: ESOPHAGEAL BANDING;  Surgeon: Daneil Dolin, MD;  [facility];  Service: Endoscopy;  Laterality: N/A;   ESOPHAGEAL BANDING  01/16/2021   Procedure: ESOPHAGEAL BANDING;  Surgeon: Daneil Dolin, MD;  [facility];  Service: Endoscopy;;   ESOPHAGOGASTRODUODENOSCOPY  01/15/2005   Three columns grade 1 to 2 esophageal varices, otherwise normal  esophageal mucosa.  Esophagus was not manipulated otherwise./Nodularity of the antrum with overlying erosions, nonspecific finding. Path showed rare H.pylori   ESOPHAGOGASTRODUODENOSCOPY  09/2008   Dr. Gaylene Brooks columns of grade 2-3 esoph varices, only one column was prominent. Portal gastropathy, multiple gastrc polyps at antrum, two were 2cm with black eschar, bulbar polyps, bulbar erosions   ESOPHAGOGASTRODUODENOSCOPY  11/2004   Dr. Brantley Stage 3 esoph varices   ESOPHAGOGASTRODUODENOSCOPY  03/31/2012   RMR: 4 columns(3-GR 2, 1-GR1) non-bleeding esophageal varices, portal gastropathy, small HH, early GAVE, multiple gastric polyps    ESOPHAGOGASTRODUODENOSCOPY (EGD) WITH PROPOFOL N/A 02/03/2018   Dr. Gala Romney: Esophageal varices, 3 columns grade 1-2.  Portal hypertensive gastropathy.  Multiple gastric polyps, biopsy consistent with hyperplastic.   ESOPHAGOGASTRODUODENOSCOPY (EGD) WITH PROPOFOL N/A 09/19/2019   Fields: grade I and II esophageal varcies, mild portal hypertensive gastropathy, moderate gastritis but no H. pylori, single hyperplastic gastric polyp removed, obvious source for melena/transfusion dependent anemia not identified, may be due to friable gastric and duodenal mucosa in the setting of portal hypertension   ESOPHAGOGASTRODUODENOSCOPY (EGD) WITH PROPOFOL N/A 01/16/2021   Surgeon: Daneil Dolin, MD; Portal hypertensive gastropathy, grade 2 esophageal varices with bleeding stigmata s/p banding x4, slight nodularity and congestion involving bulb and second portion of duodenum, nonspecific.  Recommended repeat EGD in 4 weeks.   ESOPHAGOGASTRODUODENOSCOPY (EGD) WITH PROPOFOL N/A 05/22/2021   stigmata of prior esophageal banding and no significant varices seen. Portal gastropathy. Polypoid gastric and duodenal mucosa with overlying erosions, marked mucosal friability.   ESOPHAGOGASTRODUODENOSCOPY (EGD) WITH PROPOFOL N/A 02/06/2022   Procedure: ESOPHAGOGASTRODUODENOSCOPY (EGD) WITH  PROPOFOL;  Surgeon: Harvel Quale, MD;  [facility];  Service: Gastroenterology;  Laterality: N/A;   GASTRIC VARICES BANDING  03/31/2012   Procedure: GASTRIC VARICES BANDING;  Surgeon: Daneil Dolin, MD;  [facility];  Service: Endoscopy;  Laterality: N/A;   GIVENS CAPSULE STUDY N/A 09/21/2019   Poor study with a lot of debris obstructing much of the view of the first approximate 3 hours out of 6 hours.  No obvious source of bleeding identified.  Sequela of bleeding and old blood seen in the form of"whisps" of blood, blood flecks mostly toward the end of the study.   HEMOSTASIS CLIP PLACEMENT  01/19/2021   Procedure: HEMOSTASIS CLIP PLACEMENT;  Surgeon: [name], MD;  Location: [facility] ENDOSCOPY;  Service: Gastroenterology;;   HEMOSTASIS CLIP PLACEMENT  02/06/2022   Procedure: HEMOSTASIS CLIP PLACEMENT;  Surgeon: Montez Morita, Quillian Quince, MD;  [facility];  Service: Gastroenterology;;  gastric body   HOT HEMOSTASIS  02/06/2022   Procedure: HOT HEMOSTASIS (ARGON PLASMA COAGULATION/BICAP);  Surgeon: Montez Morita, Quillian Quince, MD;  [facility];  Service: Gastroenterology;;   IR REMOVAL TUN CV CATH W/O [facility]  02/15/2020   POLYPECTOMY  09/19/2019   Procedure: POLYPECTOMY;  Surgeon:  Danie Binder, MD;  [facility];  Service: Endoscopy;;   POLYPECTOMY  01/19/2021   Procedure: POLYPECTOMY;  Surgeon: [name], MD;  Location: [facility] ENDOSCOPY;  Service: Gastroenterology;;   UMBILICAL HERNIA REPAIR  2017   exploratory laparotomy, abdominal washout    Prior to Admission medications   Medication Sig Start Date End Date Taking? Authorizing Provider  acetaminophen (TYLENOL) 650 MG CR tablet Take 650 mg by mouth every 8 (eight) hours as needed for pain.   Yes [provider]  Acidophilus Lactobacillus CAPS Take 1 tablet by mouth daily.   Yes [provider]  allopurinol (ZYLOPRIM) 300 MG tablet Take 300 mg by  mouth daily. 12/11/21  Yes [provider]  ALPRAZolam Duanne Moron) 0.5 MG tablet Take 1 tablet (0.5 mg total) by mouth See admin instructions. Take one tablet (0.5 mg) by mouth on Monday, Wednesday, Friday before dialysis, may also take one tablet (0.5 mg) daily as needed for anxiety. 02/07/22  Yes Tat, Shanon Brow, MD  Ascorbic Acid (VITAMIN C) 1000 MG tablet Take 1,000 mg by mouth in the morning.   Yes [provider]  calcium carbonate (TUMS - DOSED IN MG ELEMENTAL CALCIUM) 500 MG chewable tablet Chew 1 tablet by mouth 3 (three) times daily with meals.   Yes [provider]  cetirizine (ZYRTEC) 10 MG tablet Take 10 mg by mouth daily. 06/24/21  Yes [provider]  cholecalciferol (VITAMIN D3) 25 MCG (1000 UNIT) tablet Take 1,000 Units by mouth daily.   Yes [provider]  diltiazem (TIAZAC) 120 MG 24 hr capsule Take 120 mg by mouth daily. 02/17/22  Yes [provider]  famotidine (PEPCID) 20 MG tablet Take 1 tablet (20 mg total) by mouth 2 (two) times daily. 07/30/21 07/30/22 Yes Barton Dubois, MD  furosemide (LASIX) 40 MG tablet Take 80 mg by mouth daily. 12/29/19  Yes [provider]  GLUCAGEN HYPOKIT 1 MG SOLR injection Inject 1 mg into the muscle once as needed for low blood sugar. 11/28/20  Yes [provider]  HUMALOG KWIKPEN 100 UNIT/ML KwikPen 10-50 Units 3 (three) times daily. Sliding scale 04/23/21  Yes [provider]  hydrOXYzine (ATARAX/VISTARIL) 25 MG tablet Take 25 mg by mouth 3 (three) times daily. For itching 11/23/20  Yes [provider]  iron polysaccharides (NIFEREX) 150 MG capsule Take 150 mg by mouth daily with supper.   Yes [provider]  lactulose (CHRONULAC) 10 GM/15ML solution Take 15 mLs (10 g total) by mouth 2 (two) times daily. 02/07/22  Yes Tat, Shanon Brow, MD  mirtazapine (REMERON) 30 MG tablet Take 1 tablet (30 mg total) by mouth at bedtime. 02/10/20  Yes Roxan Hockey, MD  Multiple Vitamin  (MULTIVITAMIN WITH MINERALS) TABS tablet Take 1 tablet by mouth in the morning.    Yes [provider]  omega-3 acid ethyl esters (LOVAZA) 1 g capsule Take 2 g by mouth 2 (two) times daily.  04/07/19  Yes [provider]  ondansetron (ZOFRAN) 4 MG tablet Take 4 mg by mouth every 8 (eight) hours as needed for nausea or vomiting.   Yes [provider]  pravastatin (PRAVACHOL) 20 MG tablet Take 20 mg by mouth in the morning.    Yes [provider]  thiamine (VITAMIN B-1) 100 MG tablet Take 100 mg by mouth daily. 08/27/20  Yes [provider]  triamcinolone cream (KENALOG) 0.1 % Apply 1 application topically 2 (two) times daily as needed (dry skin Itching). 12/30/17  Yes [provider]  venlafaxine (EFFEXOR) 75 MG tablet Take 75 mg by mouth 3 (three) times daily with meals.    Yes [provider]  XIFAXAN 550 MG TABS tablet TAKE 1 TABLET BY MOUTH TWICE DAILY 02/12/22  Yes Annitta Needs, NP  Alcohol Swabs (ALCOHOL PREP) 70 % PADS Apply 1 each topically 3 (three) times daily. 01/31/20   [provider]  BD PEN NEEDLE MICRO U/F 32G X 6 MM MISC SMARTSIG:SUB-Q Daily PRN 04/23/21   [provider]  Continuous Blood Gluc Sensor (FREESTYLE LIBRE 2 SENSOR) MISC Apply topically. 10/15/21   [provider]  gabapentin (NEURONTIN) 100 MG capsule Take 100 mg by mouth 3 (three) times daily. Patient not taking: Reported on 02/19/2022 02/17/22   [provider]  HYDROcodone-acetaminophen (NORCO/VICODIN) 5-325 MG tablet Take 1 tablet by mouth 2 (two) times daily as needed. Patient not taking: Reported on 02/19/2022 02/09/22   [provider]  levofloxacin (LEVAQUIN) 500 MG tablet Take by mouth. Patient not taking: Reported on 02/19/2022 02/17/22   [provider]  pantoprazole (PROTONIX) 40 MG tablet Take 40 mg by mouth daily. Patient not taking: Reported on 02/19/2022 02/17/22   [provider]   spironolactone (ALDACTONE) 50 MG tablet Take 50 mg by mouth daily. Patient not taking: Reported on 02/19/2022 02/17/22   [provider]  tiZANidine (ZANAFLEX) 2 MG tablet Take 2 mg by mouth 2 (two) times daily. Patient not taking: Reported on 02/19/2022 02/17/22   [provider]  TRESIBA FLEXTOUCH 200 UNIT/ML FlexTouch Pen Inject into the skin. Patient not taking: Reported on 02/19/2022 02/17/22   [provider]    Current Facility-Administered Medications  Medication Dose Route Frequency Provider Last Rate Last Admin   acetaminophen (TYLENOL) tablet 650 mg  650 mg Oral Q6H PRN Emokpae, Ejiroghene E, MD       Or   acetaminophen (TYLENOL) suppository 650 mg  650 mg Rectal Q6H PRN Emokpae, Ejiroghene E, MD       allopurinol (ZYLOPRIM) tablet 300 mg  300 mg Oral Daily Emokpae, Ejiroghene E, MD   300 mg at 02/20/22 9892   ALPRAZolam (XANAX) tablet 0.5 mg  0.5 mg Oral Q M,W,F-HD Emokpae, Ejiroghene E, MD       ALPRAZolam Duanne Moron) tablet 0.5 mg  0.5 mg Oral Daily PRN Pham, Minh Q, RPH-CPP       calcium carbonate (TUMS - dosed in mg elemental calcium) chewable tablet 200 mg of elemental calcium  1 tablet Oral TID WC Emokpae, Ejiroghene E, MD   200 mg of elemental calcium at 02/20/22 0828   Chlorhexidine Gluconate Cloth 2 % PADS 6 each  6 each Topical Daily Tat, David, MD   6 each at 02/20/22 0830   furosemide (LASIX) tablet 80 mg  80 mg Oral Daily Emokpae, Ejiroghene E, MD   80 mg at 02/20/22 0828   influenza vaccine adjuvanted (FLUAD) injection 0.5 mL  0.5 mL Intramuscular Tomorrow-1000 Emokpae, Ejiroghene E, MD       insulin aspart (novoLOG) injection 0-9 Units  0-9 Units Subcutaneous Q4H Emokpae, Ejiroghene E, MD   1 Units at 02/20/22 0830   lactulose (CHRONULAC) 10 GM/15ML solution 10 g  10 g Oral BID Emokpae, Ejiroghene E, MD   10 g at 02/20/22 0826   mirtazapine (REMERON) tablet 30 mg  30 mg Oral QHS Emokpae, Ejiroghene E, MD   30 mg at 02/19/22 2152   [START ON  02/23/2022] pantoprazole (PROTONIX) injection 40  mg  40 mg Intravenous Q12H Emokpae, Ejiroghene E, MD       pantoprozole (PROTONIX) 80 mg /NS 100 mL infusion  8 mg/hr Intravenous Continuous Emokpae, Ejiroghene E, MD 10 mL/hr at 02/20/22 0030 8 mg/hr at 02/20/22 0030   polyethylene glycol (MIRALAX / GLYCOLAX) packet 17 g  17 g Oral Daily PRN Emokpae, Ejiroghene E, MD       rifaximin (XIFAXAN) tablet 550 mg  550 mg Oral BID Emokpae, Ejiroghene E, MD   550 mg at 02/20/22 0827   venlafaxine (EFFEXOR) tablet 75 mg  75 mg Oral TID WC Emokpae, Ejiroghene E, MD   75 mg at 02/20/22 0827    Allergies as of 02/19/2022 - Review Complete 02/19/2022  Allergen Reaction Noted   Tape Itching, Rash, and Other (See Comments) 09/18/2019    Family History  Problem Relation Age of Onset   Colon cancer Father 40       deceased age 34   Breast cancer Sister        deceased   Diabetes Sister    Stroke Mother    Healthy Son    Healthy Daughter    Lung cancer Neg Hx    Ovarian cancer Neg Hx     Social History   Socioeconomic History   Marital status: Married    Spouse name: Not on file   Number of children: 2   Years of education: Not on file   Highest education level: Not on file  Occupational History   Occupation: RETIRED    Employer: SELF EMPLOYED  Tobacco Use   Smoking status: Former    Packs/day: 2.00    Years: 25.00    Total pack years: 50.00    Types: Cigarettes    Quit date: 01/28/1986    Years since quitting: 36.0   Smokeless tobacco: Never  Vaping Use   Vaping Use: Never used  Substance and Sexual Activity   Alcohol use: No    Comment: quit in 05/2013   Drug use: No   Sexual activity: Not Currently  Other Topics Concern   Not on file  Social History Narrative   Two step children from second marriage (divorced) who live with him along with some grandchildren.    Social Determinants of Health   Financial Resource Strain: Not on file  Food Insecurity: No Food Insecurity  (09/30/2021)   Hunger Vital Sign    Worried About Running Out of Food in the Last Year: Never true    Ran Out of Food in the Last Year: Never true  Transportation Needs: No Transportation Needs (09/30/2021)   PRAPARE - Hydrologist (Medical): No    Lack of Transportation (Non-Medical): No  Physical Activity: Inactive (05/30/2020)   Exercise Vital Sign    Days of Exercise per Week: 0 days    Minutes of Exercise per Session: 0 min  Stress: Not on file  Social Connections: Not on file  Intimate Partner Violence: Not At Risk (05/30/2020)   Humiliation, Afraid, Rape, and Kick questionnaire    Fear of Current or Ex-Partner: No    Emotionally Abused: No    Physically Abused: No    Sexually Abused: No     Review of Systems   Gen: Denies any fever, chills, loss of appetite, change in weight or weight loss CV: Denies chest pain, heart palpitations, syncope, edema  Resp: Denies shortness of breath with rest, cough, wheezing, coughing up blood, and pleurisy. GI: see  HPI GU : Denies urinary burning, blood in urine, urinary frequency, and urinary incontinence. MS: Denies joint pain, limitation of movement, swelling, cramps, and atrophy.  Derm: Denies rash, itching, dry skin, hives. Psych: Denies depression, anxiety, memory loss, hallucinations, and confusion. Heme: Denies bruising or bleeding Neuro:  Denies any headaches, dizziness, paresthesias, shaking  Physical Exam   Vital Signs in last 24 hours: Temp:  [97.7 F (36.5 C)-98.5 F (36.9 C)] 98.4 F (36.9 C) (09/22 0700) Pulse Rate:  [82-88] 88 (09/22 0700) Resp:  [16-18] 16 (09/22 0700) BP: (145-160)/(58-71) 159/70 (09/22 0700) SpO2:  [92 %-100 %] 99 % (09/22 0700) Weight:  [74.8 kg] 74.8 kg (09/21 1345) Last BM Date : 02/19/22  General:   Alert, pleasant and cooperative in NAD Head:  Normocephalic and atraumatic. Eyes:  Sclera clear, no icterus.   Conjunctiva pink. Ears:  Hard of hearing.  Mouth:  No  deformity or lesions, dentition normal. Neck:  Supple; no masses Lungs:  Clear throughout to auscultation.   No wheezes, crackles, or rhonchi. No acute distress. Heart:  Regular rate and rhythm; no murmurs, clicks, rubs,  or gallops. Abdomen:  Soft, nontender and nondistended. No masses, hepatosplenomegaly or hernias noted. Normal bowel sounds, without guarding, and without rebound.   Rectal: deferred Msk:  Symmetrical without gross deformities. Normal posture. Extremities:  Without clubbing or edema. Neurologic:  Alert and  oriented x4. No asterixis.  Skin:  Intact without significant lesions or rashes. Psych:  Alert and cooperative. Normal mood and affect.  Intake/Output from previous day: 09/21 0701 - 09/22 0700 In: 757.4 [P.O.:240; I.V.:117.4; Blood:300; IV Piggyback:100] Out: 300 [Urine:300] Intake/Output this shift: No intake/output data recorded.   Labs/Studies   Recent Labs Recent Labs    02/19/22 1421 02/19/22 2149 02/20/22 0614  WBC 2.9* 3.2* 3.3*  HGB 7.7* 8.4* 8.6*  HCT 24.3* 25.9* 26.5*  PLT 113* 124* 132*   BMET Recent Labs    02/19/22 1421 02/20/22 0613  NA 130* 133*  K 3.0* 3.5  CL 94* 99  CO2 25 24  GLUCOSE 332* 132*  BUN 20 23  CREATININE 2.48* 2.89*  CALCIUM 8.2* 8.4*   LFT Recent Labs    02/19/22 1421  PROT 6.5  ALBUMIN 2.6*  AST 55*  ALT 53*  ALKPHOS 381*  BILITOT 0.9   PT/INR Recent Labs    02/19/22 1421  LABPROT 15.3*  INR 1.2   Hepatitis Panel No results for input(s): "HEPBSAG", "HCVAB", "HEPAIGM", "HEPBIGM" in the last 72 hours. C-Diff No results for input(s): "CDIFFTOX" in the last 72 hours.  Radiology/Studies No results found.   Assessment   Jeffrey [name] is a 76 y.o. year old male history of decompensated alcoholic cirrhosis (quit in 8341) complicated by variceal hemorrhage/encephalopathy/SBP/volume overload, ESRD on hemodialysis M, W, F, A-fib not anticoagulated, CHF, HTN, diabetes, IDA followed by hematology who  presented to the ED with reports of black/tarry stools/symptomatic anemia.  Recently admitted for the same 9/4 - 9/9.  GI consulted for rectal bleeding, history of alcoholic liver disease with rectal esophageal varices.  Decompensated alcoholic cirrhosis: No EtOH since 2014.  As stated previously, his course has been complicated by esophageal variceal bleeding, recurrent SBP, hepatic cephalopathy, and ascites requiring paracenteses.  Recent hospitalization with weakness, falls, and confusion as well as some intermittent watery stools.  Presented yesterday from the [facility] due to reports of melanotic stool and weakness.  Prior endoscopic work-up as outlined in HPI.  Patient denies any recent confusion.  He has continued to take Xifaxan 550 mg twice daily, and takes lactulose on days that he does not have dialysis.  He has denied any recent fevers, chills, abdominal pain.  Does have some occasional pruritus.  No evidence of asterixis today.  Some abdominal distention noted but soft.  Has been having 1-2 bowel movements daily and denies any BRBPR.  Recommend continuing current diuretic regimen as well as Xifaxin BID and lactulose BID.  Acute on chronic anemia: Patient presented with possibly multiple melanotic stools, reported the second stool that he had he showed the nurse at the [facility] and then was transferred to the hospital.  He has reported some weakness but has been doing physical therapy at rehab since his last discharge.  On admission, BUN normal. Hgb on admission 7.7, which was previously 9.92 weeks ago.  He received 1 unit of blood and had improvement of his hemoglobin today to 8.4.  Has previously been receiving monthly iron infusions along with B12 injections, follows with hematology regarding his anemia.  Endoscopic work-up as stated in HPI.  Notably, on 9/8 his EGD showed evidence of bleeding on the posterior gastric wall that was treated with APC therapy and evidence of portal  hypertensive gastropathy.  Has had prior colonoscopies noting rectal varices.  He has denied any BRBPR.  Per discussion with Dr. Gala Romney, we will proceed with conservative management given his prior extensive endoscopic work-up.  Continue to monitor H/H with goal to get back to baseline, may give iron as needed.  He should have GI and hematology follow-up outpatient.   Plan / Recommendations   Continue Xifaxin 550 mg BID Continue Lactulose 10 g BID, titrate for 2-3 stools daily.  Continue to monitor H/H, transfuse as needed. Discharge once hgb stable.  Continue PPI BID Iron replacement as needed. Continue to monitor for worsening bleeding.  Continue home diuretics  Heart healthy diet, 2g sodium limit.  Hospital follow up in 3-4 weeks.      02/20/2022, 10:30 AM  Venetia Night, MSN, FNP-BC, AGACNP-BC [facility]  Attending note:   Seen and examined.  Patient is well known to me.  He has a history of end-stage EtOH related liver disease  - chronically decompensated with recurrent GI bleeding (likely multifactorial in etiology) along with refractory ascites with the common denominator being portal hypertension in the setting of chronic kidney disease on hemodialysis. I agree with our assessment and recommendations as outlined above.  On more than one occasion, I have had a discussion with the patient about considering a TIPS in the hopes of diminishing paracentesis requirements and decreasing the tempo GI bleeding.  Today,  I had further discussion regarding this approach with the patient.  He is now open to considering it.  Although one of the potential morbidities of TIPS placement would be an increased risk of hepatic encephalopathy, it would not be a prohibiting factor. Therefore, we will pursue an outpatient IR consultation for consideration of a TI PS in the near future.

## 2022-02-20 NOTE — Consult Note (Addendum)
ESRD Consult Note  Assessment/Recommendations:   ESRD:  -outpatient orders per recent admission: [facility]  on MWF . EDW 79 kg HD Bath 2K/2.5Ca  Time 4 hours Heparin none. Access LUE AVF BFR 350  DFR 500    Micera 150 mcg IV every 2 weeks (last dose 9/18)  Venofer  50 mg IV weekly (received on the week of 9/18) -HD on MWF schedule, HD today  Rectal bleed/ABLA, Anemia of Chronic Kidney Disease: Hemoglobin up to 8.6, s/p 1u prbc. Received mircera and venofer already this week as an outpatient. Transfuse PRN for hgb <7. Per primary and GI  Volume/ hypertension: EDW 79kg. Attempt to achieve EDW as tolerated  Alcoholic cirrhosis -may need para prior to d/c. Per primary  Secondary Hyperparathyroidism/Hyperphosphatemia: resume home meds   DM2 with hyperglycemia -mgmt per primary service  Recommendations were discussed with the primary team.  We will be available over the weekend if needed. Please call covering nephrologist with any questions/concerns.   History of Present Illness: Jeffrey [name] is a/an 76 y.o. male with a past medical history of ESRD, alcoholic cirrhosis, hypertension, esophageal varices, chronic respiratory failure, recurrent GI bleed who presents with rectal bleeding/symptomatic anemia.  Was recently admitted here for the same issue, discharged on 9/9 with hemoglobin 9.9 on 9/8.  Hemoglobin on presentation was 7.7, received 1 unit PRBC. Patient seen and examined bedside this AM. He reports that he had some blood in his urine when his foley was exchanged at his facility but this seems to have resolved. He does report that his abdomen is more swollen. He does report chronic orthopnea. He otherwise denies any fevers, chills, chest pain, SOB sitting up, recent issues with HD and/or access.  Medications:  Current Facility-Administered Medications  Medication Dose Route Frequency Provider Last Rate Last Admin   acetaminophen (TYLENOL) tablet 650 mg  650 mg Oral Q6H PRN  Emokpae, Ejiroghene E, MD       Or   acetaminophen (TYLENOL) suppository 650 mg  650 mg Rectal Q6H PRN Emokpae, Ejiroghene E, MD       allopurinol (ZYLOPRIM) tablet 300 mg  300 mg Oral Daily Emokpae, Ejiroghene E, MD   300 mg at 02/20/22 5784   ALPRAZolam (XANAX) tablet 0.5 mg  0.5 mg Oral Q M,W,F-HD Emokpae, Ejiroghene E, MD       ALPRAZolam Duanne Moron) tablet 0.5 mg  0.5 mg Oral Daily PRN Pham, Minh Q, RPH-CPP       calcium carbonate (TUMS - dosed in mg elemental calcium) chewable tablet 200 mg of elemental calcium  1 tablet Oral TID WC Emokpae, Ejiroghene E, MD   200 mg of elemental calcium at 02/20/22 0828   Chlorhexidine Gluconate Cloth 2 % PADS 6 each  6 each Topical Daily Tat, David, MD   6 each at 02/20/22 0830   furosemide (LASIX) tablet 80 mg  80 mg Oral Daily Emokpae, Ejiroghene E, MD   80 mg at 02/20/22 0828   influenza vaccine adjuvanted (FLUAD) injection 0.5 mL  0.5 mL Intramuscular Tomorrow-1000 Emokpae, Ejiroghene E, MD       insulin aspart (novoLOG) injection 0-9 Units  0-9 Units Subcutaneous Q4H Emokpae, Ejiroghene E, MD   1 Units at 02/20/22 0830   lactulose (CHRONULAC) 10 GM/15ML solution 10 g  10 g Oral BID Emokpae, Ejiroghene E, MD   10 g at 02/20/22 0826   mirtazapine (REMERON) tablet 30 mg  30 mg Oral QHS Emokpae, Ejiroghene E, MD   30 mg at  02/19/22 2152   [START ON 02/23/2022] pantoprazole (PROTONIX) injection 40 mg  40 mg Intravenous Q12H Emokpae, Ejiroghene E, MD       pantoprozole (PROTONIX) 80 mg /NS 100 mL infusion  8 mg/hr Intravenous Continuous Emokpae, Ejiroghene E, MD 10 mL/hr at 02/20/22 0030 8 mg/hr at 02/20/22 0030   polyethylene glycol (MIRALAX / GLYCOLAX) packet 17 g  17 g Oral Daily PRN Emokpae, Ejiroghene E, MD       rifaximin (XIFAXAN) tablet 550 mg  550 mg Oral BID Emokpae, Ejiroghene E, MD   550 mg at 02/20/22 0827   venlafaxine (EFFEXOR) tablet 75 mg  75 mg Oral TID WC Emokpae, Ejiroghene E, MD   75 mg at 02/20/22 0827     ALLERGIES Tape  MEDICAL  HISTORY Past Medical History:  Diagnosis Date   A-fib [facility])    when initially stating dialysis january 2021 at [facility] per spouse; never heard anything else about it   Alcoholic cirrhosis ([place])    patient reports completing hep a and b vaccines in 2006   Anemia    has had 3 units of prbcs 2013   Anxiety    Arthritis    Asymptomatic gallstones    Ultrasound in 2006   B12 deficiency 11/21/2020   C. difficile colitis    Cholelithiasis    Chronic kidney disease    Dialysis M/W/F/Sa   Depression    Diabetes ([place])    Duodenal ulcer with hemorrhage    per patient in 2006 or 2007, records have been requested   Dyspnea    low oxygen level -  has O2 2 L all day   Esophageal varices ([place])    see PSH   GERD (gastroesophageal reflux disease)    Heart burn    History of alcohol abuse    quit 05/2013   HOH (hard of hearing)    Hypertension    Liver cirrhosis ([place])    ETOH   SBP (spontaneous bacterial peritonitis) ([place])    2020, November 2021.    Splenomegaly    Ultrasound in 2006     SOCIAL HISTORY Social History   Socioeconomic History   Marital status: Married    Spouse name: Not on file   Number of children: 2   Years of education: Not on file   Highest education level: Not on file  Occupational History   Occupation: RETIRED    Employer: SELF EMPLOYED  Tobacco Use   Smoking status: Former    Packs/day: 2.00    Years: 25.00    Total pack years: 50.00    Types: Cigarettes    Quit date: 01/28/1986    Years since quitting: 36.0   Smokeless tobacco: Never  Vaping Use   Vaping Use: Never used  Substance and Sexual Activity   Alcohol use: No    Comment: quit in 05/2013   Drug use: No   Sexual activity: Not Currently  Other Topics Concern   Not on file  Social History Narrative   Two step children from second marriage (divorced) who live with him along with some grandchildren.    Social Determinants of Health   Financial Resource Strain: Not on file   Food Insecurity: No Food Insecurity (09/30/2021)   Hunger Vital Sign    Worried About Running Out of Food in the Last Year: Never true    Ran Out of Food in the Last Year: Never true  Transportation Needs: No Transportation Needs (09/30/2021)  PRAPARE - Hydrologist (Medical): No    Lack of Transportation (Non-Medical): No  Physical Activity: Inactive (05/30/2020)   Exercise Vital Sign    Days of Exercise per Week: 0 days    Minutes of Exercise per Session: 0 min  Stress: Not on file  Social Connections: Not on file  Intimate Partner Violence: Not At Risk (05/30/2020)   Humiliation, Afraid, Rape, and Kick questionnaire    Fear of Current or Ex-Partner: No    Emotionally Abused: No    Physically Abused: No    Sexually Abused: No     FAMILY HISTORY Family History  Problem Relation Age of Onset   Colon cancer Father 16       deceased age 90   Breast cancer Sister        deceased   Diabetes Sister    Stroke Mother    Healthy Son    Healthy Daughter    Lung cancer Neg Hx    Ovarian cancer Neg Hx      Review of Systems: 12 systems were reviewed and negative except per HPI  Physical Exam: Vitals:   02/20/22 0023 02/20/22 0700  BP: (!) 151/71 (!) 159/70  Pulse: 82 88  Resp: 16 16  Temp: 97.7 F (36.5 C) 98.4 F (36.9 C)  SpO2: 98% 99%   No intake/output data recorded.  Intake/Output Summary (Last 24 hours) at 02/20/2022 0908 Last data filed at 02/20/2022 0100 Gross per 24 hour  Intake 757.39 ml  Output 300 ml  Net 457.39 ml   General: no acute distress, sitting up in bed HEENT: icteric sclera, MMM CV: normal rate, no murmurs, no edema Lungs: bilateral chest rise, normal wob Abd: soft, non-tender, distended Psych: alert, engaged, appropriate mood and affect Neuro: normal speech, no gross focal deficits  Dialysis access: LUE AVF +b/t  Test Results Reviewed Lab Results  Component Value Date   NA 133 (L) 02/20/2022   K 3.5  02/20/2022   CL 99 02/20/2022   CO2 24 02/20/2022   BUN 23 02/20/2022   CREATININE 2.89 (H) 02/20/2022   CALCIUM 8.4 (L) 02/20/2022   ALBUMIN 2.6 (L) 02/19/2022   PHOS 4.5 02/07/2022    I have reviewed relevant outside healthcare records

## 2022-02-20 NOTE — Procedures (Signed)
   Nephrology Nursing Note :   After d/w Dr. Candiss Norse, HD has been rescheduled for tomorrow due to high patient census and device issues.   Rockwell Alexandria, RN

## 2022-02-20 NOTE — Assessment & Plan Note (Signed)
Normally on 3L at night/sleeping Stable presently

## 2022-02-21 DIAGNOSIS — D62 Acute posthemorrhagic anemia: Secondary | ICD-10-CM | POA: Diagnosis not present

## 2022-02-21 DIAGNOSIS — K625 Hemorrhage of anus and rectum: Secondary | ICD-10-CM | POA: Diagnosis not present

## 2022-02-21 DIAGNOSIS — K7031 Alcoholic cirrhosis of liver with ascites: Secondary | ICD-10-CM | POA: Diagnosis not present

## 2022-02-21 DIAGNOSIS — J9611 Chronic respiratory failure with hypoxia: Secondary | ICD-10-CM | POA: Diagnosis not present

## 2022-02-21 LAB — CBC
HCT: 28.5 % — ABNORMAL LOW (ref 39.0–52.0)
Hemoglobin: 9.2 g/dL — ABNORMAL LOW (ref 13.0–17.0)
MCH: 32.1 pg (ref 26.0–34.0)
MCHC: 32.3 g/dL (ref 30.0–36.0)
MCV: 99.3 fL (ref 80.0–100.0)
Platelets: 142 10*3/uL — ABNORMAL LOW (ref 150–400)
RBC: 2.87 MIL/uL — ABNORMAL LOW (ref 4.22–5.81)
RDW: 20 % — ABNORMAL HIGH (ref 11.5–15.5)
WBC: 4.4 10*3/uL (ref 4.0–10.5)
nRBC: 0 % (ref 0.0–0.2)

## 2022-02-21 LAB — GLUCOSE, CAPILLARY
Glucose-Capillary: 181 mg/dL — ABNORMAL HIGH (ref 70–99)
Glucose-Capillary: 167 mg/dL — ABNORMAL HIGH (ref 70–99)
Glucose-Capillary: 178 mg/dL — ABNORMAL HIGH (ref 70–99)

## 2022-02-21 LAB — HEPATITIS B CORE ANTIBODY, TOTAL: Hep B Core Total Ab: NONREACTIVE

## 2022-02-21 LAB — HEPATITIS B SURFACE ANTIBODY, QUANTITATIVE: Hep B S AB Quant (Post): 7.8 m[IU]/mL — ABNORMAL LOW (ref >9.9)

## 2022-02-21 LAB — HEPATITIS B SURFACE ANTIGEN: Hepatitis B Surface Ag: NONREACTIVE

## 2022-02-21 MED ORDER — PANTOPRAZOLE SODIUM 40 MG PO TBEC: 40.0000 mg | DELAYED_RELEASE_TABLET | Freq: Two times a day (BID) | ORAL | 1 refills | Status: AC

## 2022-02-21 MED ORDER — METOPROLOL SUCCINATE ER 25 MG PO TB24: 25.0000 mg | ORAL_TABLET | Freq: Every day | ORAL | 1 refills | Status: AC

## 2022-02-21 MED ORDER — POTASSIUM CHLORIDE CRYS ER 20 MEQ PO TBCR: 40.0000 meq | EXTENDED_RELEASE_TABLET | Freq: Once | ORAL | Status: DC

## 2022-02-21 NOTE — Progress Notes (Signed)
Nsg Discharge Note  Admit Date:  02/19/2022 Discharge date: 02/21/2022   Jeffrey [name] to be D/C'd Home per MD order.  AVS completed.  Copy for chart, and copy for patient signed, and dated. Patient/caregiver able to verbalize understanding.  Discharge Medication: Allergies as of 02/21/2022       Reactions   Tape Itching, Rash, Other (See Comments)   Tears skin        Medication List     STOP taking these medications    gabapentin 100 MG capsule Commonly known as: NEURONTIN   HYDROcodone-acetaminophen 5-325 MG tablet Commonly known as: NORCO/VICODIN   levofloxacin 500 MG tablet Commonly known as: LEVAQUIN   spironolactone 50 MG tablet Commonly known as: ALDACTONE   tiZANidine 2 MG tablet Commonly known as: ZANAFLEX       TAKE these medications    acetaminophen 650 MG CR tablet Commonly known as: TYLENOL Take 650 mg by mouth every 8 (eight) hours as needed for pain.   Acidophilus Lactobacillus Caps Take 1 tablet by mouth daily.   Alcohol Prep 70 % Pads Apply 1 each topically 3 (three) times daily.   allopurinol 300 MG tablet Commonly known as: ZYLOPRIM Take 300 mg by mouth daily.   ALPRAZolam 0.5 MG tablet Commonly known as: XANAX Take 1 tablet (0.5 mg total) by mouth See admin instructions. Take one tablet (0.5 mg) by mouth on Monday, Wednesday, Friday before dialysis, may also take one tablet (0.5 mg) daily as needed for anxiety.   BD Pen Needle Micro U/F 32G X 6 MM Misc Generic drug: Insulin Pen Needle SMARTSIG:SUB-Q Daily PRN   calcium carbonate 500 MG chewable tablet Commonly known as: TUMS - dosed in mg elemental calcium Chew 1 tablet by mouth 3 (three) times daily with meals.   cetirizine 10 MG tablet Commonly known as: ZYRTEC Take 10 mg by mouth daily.   cholecalciferol 25 MCG (1000 UNIT) tablet Commonly known as: VITAMIN D3 Take 1,000 Units by mouth daily.   diltiazem 120 MG 24 hr capsule Commonly known as: TIAZAC Take 120 mg by mouth  daily.   famotidine 20 MG tablet Commonly known as: Pepcid Take 1 tablet (20 mg total) by mouth 2 (two) times daily.   FreeStyle Libre 2 Sensor Misc Apply topically.   furosemide 40 MG tablet Commonly known as: LASIX Take 80 mg by mouth daily.   GlucaGen HypoKit 1 MG Solr injection Generic drug: glucagon Inject 1 mg into the muscle once as needed for low blood sugar.   HumaLOG KwikPen 100 UNIT/ML KwikPen Generic drug: insulin lispro 10-50 Units 3 (three) times daily. Sliding scale   hydrOXYzine 25 MG tablet Commonly known as: ATARAX Take 25 mg by mouth 3 (three) times daily. For itching   iron polysaccharides 150 MG capsule Commonly known as: NIFEREX Take 150 mg by mouth daily with supper.   lactulose 10 GM/15ML solution Commonly known as: CHRONULAC Take 15 mLs (10 g total) by mouth 2 (two) times daily.   metoprolol succinate 25 MG 24 hr tablet Commonly known as: TOPROL-XL Take 1 tablet (25 mg total) by mouth daily. Start taking on: February 22, 2022   mirtazapine 30 MG tablet Commonly known as: REMERON Take 1 tablet (30 mg total) by mouth at bedtime.   multivitamin with minerals Tabs tablet Take 1 tablet by mouth in the morning.   omega-3 acid ethyl esters 1 g capsule Commonly known as: LOVAZA Take 2 g by mouth 2 (two) times daily.  ondansetron 4 MG tablet Commonly known as: ZOFRAN Take 4 mg by mouth every 8 (eight) hours as needed for nausea or vomiting.   pantoprazole 40 MG tablet Commonly known as: PROTONIX Take 1 tablet (40 mg total) by mouth 2 (two) times daily. What changed: when to take this   pravastatin 20 MG tablet Commonly known as: PRAVACHOL Take 20 mg by mouth in the morning.   thiamine 100 MG tablet Commonly known as: Vitamin B-1 Take 100 mg by mouth daily.   [name] Generic drug: insulin degludec Inject into the skin.   triamcinolone cream 0.1 % Commonly known as: KENALOG Apply 1 application  topically 2 (two) times daily as needed (dry skin Itching).   venlafaxine 75 MG tablet Commonly known as: EFFEXOR Take 75 mg by mouth 3 (three) times daily with meals.   vitamin C 1000 MG tablet Take 1,000 mg by mouth in the morning.   Xifaxan 550 MG Tabs tablet Generic drug: rifaximin TAKE 1 TABLET BY MOUTH TWICE DAILY        Discharge Assessment: Vitals:   02/21/22 1724 02/21/22 1750  BP: (!) 154/82 (!) 143/87  Pulse: 91 93  Resp:  18  Temp:  98.1 F (36.7 C)  SpO2:  100%   Skin clean, dry and intact without evidence of skin break down, no evidence of skin tears noted. IV catheter discontinued intact. Site without signs and symptoms of complications - no redness or edema noted at insertion site, patient denies c/o pain - only slight tenderness at site.  Dressing with slight pressure applied.  D/c Instructions-Education: Discharge instructions given to patient/family with verbalized understanding. D/c education completed with patient/family including follow up instructions, medication list, d/c activities limitations if indicated, with other d/c instructions as indicated by MD - patient able to verbalize understanding, all questions fully answered. Patient instructed to return to ED, call 911, or call MD for any changes in condition.  Patient escorted via [place], and D/C home via private auto.  Clovis Fredrickson, LPN 4/40/3474 2:59 PM

## 2022-02-21 NOTE — Procedures (Signed)
   HEMODIALYSIS TREATMENT NOTE:   3.5 hour heparin-free treatment completed using left upper arm AVF (15g/antegrade). Goal met: 3.1 liters removed without interruption in UF.  All blood was returned and hemostasis was achieved in 20 minutes.  No changes from pre-HD assessment.  143/77 post-HD.  Hand-off given to Marianna Payment, LPN.   Rockwell Alexandria, RN

## 2022-02-21 NOTE — Progress Notes (Signed)
IV was removed causing a skin tear to his right wrist.  Skin was cleaned and dressed.

## 2022-02-21 NOTE — Discharge Summary (Signed)
Physician Discharge Summary   Patient: Jeffrey [name] MRN: [id number] DOB: May 08, 1946  Admit date:     02/19/2022  Discharge date: 02/21/22  Discharge Physician: Shanon Brow Saga Balthazar   PCP: Monico Blitz, MD   Recommendations at discharge:   Please follow up with primary care provider within 1-2 weeks  Please repeat BMP and CBC in one week    Hospital Course: 76 y.o. 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 Last episode on day of admission. He also reports melena. He denies abdominal pain.  No vomiting of blood.  Not on anticoagulation.  Reports dizziness when standing up.  He denies f/c, cp, sob, hemoptysis.   He was recently admitted from 02/02/22 to 02/07/22 for acute metabolic encephalopathy due to UTI, hyperammonemia and ABLA.  He had EGD on 02/06/22 showing G1 esoph varices; portal HTN gastropathy; red blood in posterior gastric body  tx with APC/hemoclip.  He was transfused with 2 units PRBC.  His Hgb was 9.9 at the time of d/c  ED Course: Blood pressure 140s to 150s.  Heart rate 80s.  Hemoglobin down to 7.7 down from last check of 9.9  - 9/8.  1 unit of blood ordered for transfusion.  Hospitalist admit for GI bleed. GI consulted to assist with management.  Assessment and Plan: * Rectal bleeding 2 days duration, with symptomatic anemia, dizziness, hemoglobin down to 7.7, Reports melena also.  Underwent EGD 9/8 - findings of G1 esoph varices; portal HTN gastropathy; red blood in posterior gastric body  tx with APC/hemoclip.  Denies hematemesis or abdominal pain. -Last colonoscopy 12/2020-sigmoid and descending colon diverticulosis, polyps, rectal varices,Benign polypoid lesion at the splenic flexure, likely inflammatory polyp. This was felt to be  the source of bleeding given the adherent clot and the reported friability and excessive bleeding following biopsy reported on previous colonoscopy. Biopsies showed inflammatory changes only, no dysplasia. GI consulted>>no further endoscopy at this time>>plan outpt TIPS  Acute blood loss anemia Presented with Hgb 7.7 Baseline Hgb 8-9 Due to portal HTN gastropathy vs gastric AVM 9/8 EGD-- G1 esoph varices; portal HTN gastropathy; red blood in posterior gastric body  tx with APC/hemoclip Transfused 1 units this admit  Hgb stable after transfusion--9.2 on day of dc  Alcoholic cirrhosis of liver with ascites ([place]) History of hepatic encephalopathy, esophageal and rectal varices.  Last paracentesis 01/15/2022-yielding 800 cc, prior to that 6/22 yielding 2 L. -02/20/22 paracentesis--5L -no Etoh since 2014  ESRD on dialysis [facility]) Schedule Monday Wednesday Friday.   Completed dialysis 02/18/22  Receiving 1 unit of blood. -appreciate nephrology -due to scheduling and availability--HD 02/21/22 done prior to dc  Falls Multiple falls and generalized weakness.  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 Overall stable except Hgb at time of admission Trend CBC  Chronic respiratory failure with hypoxia (HCC) Normally on 3L at night/sleeping Stable presently  Hypokalemia k - 3 at time of presentation -correction on HD  Controlled type 2 diabetes mellitus with chronic kidney disease on chronic dialysis, with long-term current use of insulin ([place]) Spouse related that she dispenses Tresiba prn when sugars are >250 --also states he had been prone to hypoglycemic events --07/29/21 A1C--5.5 --Tyler Aas was held at SNF>>restart after  return home -monitor for now  Essential hypertension, benign Stable. -Hold diltiazem and metoprolol for now in the setting of GI bleed initially -restart lower dose  metoprolol -restart diltiazem at d/c         Consultants: GI Procedures performed: none  Disposition: Home Diet recommendation:  Carb modified diet DISCHARGE MEDICATION: Allergies as of 02/21/2022       Reactions   Tape Itching, Rash, Other (See Comments)   Tears skin        Medication List     STOP taking these medications    gabapentin 100 MG capsule Commonly known as: NEURONTIN   HYDROcodone-acetaminophen 5-325 MG tablet Commonly known as: NORCO/VICODIN   levofloxacin 500 MG tablet Commonly known as: LEVAQUIN   spironolactone 50 MG tablet Commonly known as: ALDACTONE   tiZANidine 2 MG tablet Commonly known as: ZANAFLEX       TAKE these medications    acetaminophen 650 MG CR tablet Commonly known as: TYLENOL Take 650 mg by mouth every 8 (eight) hours as needed for pain.   Acidophilus Lactobacillus Caps Take 1 tablet by mouth daily.   Alcohol Prep 70 % Pads Apply 1 each topically 3 (three) times daily.   allopurinol 300 MG tablet Commonly known as: ZYLOPRIM Take 300 mg by mouth daily.   ALPRAZolam 0.5 MG tablet Commonly known as: XANAX Take 1 tablet (0.5 mg total) by mouth See admin instructions. Take one tablet (0.5 mg) by mouth on Monday, Wednesday, Friday before dialysis, may also take one tablet (0.5 mg) daily as needed for anxiety.   BD Pen Needle Micro U/F 32G X 6 MM Misc Generic drug: Insulin Pen Needle SMARTSIG:SUB-Q Daily PRN   calcium carbonate 500 MG chewable tablet Commonly known as: TUMS - dosed in mg elemental calcium Chew 1 tablet by mouth 3 (three) times daily with meals.   cetirizine 10 MG tablet Commonly known as: ZYRTEC Take 10 mg by mouth daily.   cholecalciferol 25 MCG (1000 UNIT) tablet Commonly known as: VITAMIN D3 Take 1,000 Units by mouth daily.   diltiazem 120 MG 24 hr capsule Commonly known as: TIAZAC Take 120 mg by mouth daily.   famotidine 20 MG tablet Commonly known as: Pepcid Take 1 tablet (20 mg  total) by mouth 2 (two) times daily.   FreeStyle Libre 2 Sensor Misc Apply topically.   furosemide 40 MG tablet Commonly known as: LASIX Take 80 mg by mouth daily.   GlucaGen HypoKit 1 MG Solr injection Generic drug: glucagon Inject 1 mg into the muscle once as needed for low blood sugar.   HumaLOG KwikPen 100 UNIT/ML KwikPen Generic drug: insulin lispro 10-50 Units 3 (three) times daily. Sliding scale   hydrOXYzine 25 MG tablet Commonly known as: ATARAX Take 25 mg by mouth 3 (three) times daily. For itching   iron polysaccharides 150 MG capsule Commonly known as: NIFEREX Take 150 mg by mouth daily with supper.   lactulose 10 GM/15ML solution Commonly known as: CHRONULAC Take 15 mLs (10 g total) by mouth 2 (two) times daily.   metoprolol succinate 25 MG 24 hr tablet Commonly known as: TOPROL-XL Take 1 tablet (25 mg total) by mouth daily. Start taking on: February 22, 2022   mirtazapine 30 MG tablet Commonly known as: REMERON Take 1 tablet (30 mg total) by mouth at bedtime.   multivitamin with minerals Tabs tablet Take 1 tablet by mouth in the morning.   omega-3 acid ethyl esters 1 g capsule Commonly known as: LOVAZA  Take 2 g by mouth 2 (two) times daily.   ondansetron 4 MG tablet Commonly known as: ZOFRAN Take 4 mg by mouth every 8 (eight) hours as needed for nausea or vomiting.   pantoprazole 40 MG tablet Commonly known as: PROTONIX Take 1 tablet (40 mg total) by mouth 2 (two) times daily. What changed: when to take this   pravastatin 20 MG tablet Commonly known as: PRAVACHOL Take 20 mg by mouth in the morning.   thiamine 100 MG tablet Commonly known as: Vitamin B-1 Take 100 mg by mouth daily.   [name] Generic drug: insulin degludec Inject into the skin.   triamcinolone cream 0.1 % Commonly known as: KENALOG Apply 1 application topically 2 (two) times daily as needed (dry skin Itching).   venlafaxine 75 MG  tablet Commonly known as: EFFEXOR Take 75 mg by mouth 3 (three) times daily with meals.   vitamin C 1000 MG tablet Take 1,000 mg by mouth in the morning.   Xifaxan 550 MG Tabs tablet Generic drug: rifaximin TAKE 1 TABLET BY MOUTH TWICE DAILY        Follow-up Information     [facility] Follow [facility].   Specialty: Home Health Services Why: [facility] staff will call you to schedule in home visits               Discharge Exam: Filed Weights   02/19/22 1345  Weight: 74.8 kg   HEENT:  [place]/AT, No thrush, no icterus CV:  RRR, no rub, no S3, no S4 Lung:  CTA, no wheeze, no rhonchi Abd:  soft/+BS, NT Ext:  No edema, no lymphangitis, no synovitis, no rash   Condition at discharge: stable  The results of significant diagnostics from this hospitalization (including imaging, microbiology, ancillary and laboratory) are listed below for reference.   Imaging Studies: US Paracentesis  Result Date: 02/20/2022 INDICATION: Ascites. EXAM: ULTRASOUND GUIDED therapeutic PARACENTESIS MEDICATIONS: None. COMPLICATIONS: None immediate. PROCEDURE: Informed written consent was obtained from the patient after a discussion of the risks, benefits and alternatives to treatment. A timeout was performed prior to the initiation of the procedure. Initial ultrasound scanning demonstrates a large amount of ascites within the left lower abdominal quadrant. The left lower abdomen was prepped and draped in the usual sterile fashion. 1% lidocaine was used for local anesthesia. Following this, a Safe-T-Centesis catheter was introduced. An ultrasound image was saved for documentation purposes. The paracentesis was performed. The catheter was removed and a dressing was applied. The patient tolerated the procedure well without immediate post procedural complication. FINDINGS: A total of approximately 5 L of serous fluid was removed. IMPRESSION: Successful ultrasound-guided paracentesis yielding 5  liters of peritoneal fluid. Electronically Signed   By: Marijo Conception M.D.   On: 02/20/2022 15:58   CT HEAD WO CONTRAST (5MM)  Result Date: 02/03/2022 CLINICAL DATA:  Initial evaluation for encephalopathy, multiple falls. EXAM: CT HEAD WITHOUT CONTRAST TECHNIQUE: Contiguous axial images were obtained from the base of the skull through the vertex without intravenous contrast. RADIATION DOSE REDUCTION: This exam was performed according to the departmental dose-optimization program which includes automated exposure control, adjustment of the mA and/or kV according to patient size and/or use of iterative reconstruction technique. COMPARISON:  Prior CT from 04/01/2020. FINDINGS: Brain: Age-related cerebral atrophy with chronic small vessel ischemic disease. Small remote left cerebellar infarct noted, stable. No acute intracranial hemorrhage. No acute large vessel territory infarct. No mass lesion, midline shift or  mass effect. No hydrocephalus or extra-axial fluid collection. Vascular: No abnormal hyperdense vessel. Scattered vascular calcifications noted within the carotid siphons. Skull: Scalp soft tissues demonstrate no acute finding. Calvarium intact. Sinuses/Orbits: Globes orbital soft tissues within normal limits. Paranasal sinuses and mastoid air cells are clear. Other: None. IMPRESSION: 1. No acute intracranial abnormality. 2. Age-related cerebral atrophy with chronic small vessel ischemic disease, with a small remote left cerebellar infarct. Electronically Signed   By: Jeannine Boga M.D.   On: 02/03/2022 01:49   DG Chest 2 View  Result Date: 02/02/2022 CLINICAL DATA:  Dialysis, missed dialysis, weakness, multiple falls EXAM: CHEST - 2 VIEW COMPARISON:  08/10/2021 FINDINGS: Gross cardiomegaly. Layering bilateral pleural effusions, similar to prior examination. Pulmonary vascular prominence. Osseous structures unremarkable. IMPRESSION: Gross cardiomegaly with pulmonary vascular prominence and  layering bilateral pleural effusions, similar to prior examination. Electronically Signed   By: Delanna Ahmadi M.D.   On: 02/02/2022 15:44    Microbiology: Results for orders placed or performed during the hospital encounter of 02/02/22  Urine Culture     Status: Abnormal   Collection Time: 02/02/22  3:14 PM   Specimen: Urine, Catheterized  Result Value Ref Range Status   Specimen Description   Final    URINE, CATHETERIZED Performed at [facility], [address] [place], [place] [address]    Special Requests   Final    NONE Performed at [facility], [address], [place], [place] [address]    Culture (A)  Final    >=100,000 COLONIES/mL ESCHERICHIA COLI Confirmed Extended Spectrum Beta-Lactamase Producer (ESBL).  In bloodstream infections from ESBL organisms, carbapenems are preferred over piperacillin/tazobactam. They are shown to have a lower risk of mortality. 60,000 COLONIES/mL ENTEROCOCCUS FAECIUM    Report Status 02/05/2022 FINAL  Final   Organism ID, Bacteria ESCHERICHIA COLI (A)  Final   Organism ID, Bacteria ENTEROCOCCUS FAECIUM (A)  Final      Susceptibility   Escherichia coli - MIC*    AMPICILLIN >=32 RESISTANT Resistant     CEFAZOLIN >=64 RESISTANT Resistant     CEFEPIME 4 INTERMEDIATE Intermediate     CEFTRIAXONE >=64 RESISTANT Resistant     CIPROFLOXACIN >=4 RESISTANT Resistant     GENTAMICIN <=1 SENSITIVE Sensitive     IMIPENEM 0.5 SENSITIVE Sensitive     NITROFURANTOIN <=16 SENSITIVE Sensitive     TRIMETH/SULFA >=320 RESISTANT Resistant     AMPICILLIN/SULBACTAM 8 SENSITIVE Sensitive     PIP/TAZO <=4 SENSITIVE Sensitive     * >=100,000 COLONIES/mL ESCHERICHIA COLI   Enterococcus faecium - MIC*    AMPICILLIN >=32 RESISTANT Resistant     NITROFURANTOIN 128 RESISTANT Resistant     VANCOMYCIN 1 SENSITIVE Sensitive     * 60,000 COLONIES/mL ENTEROCOCCUS FAECIUM    Labs: CBC: Recent Labs  Lab 02/19/22 1421 02/19/22 2149 02/20/22 0614 02/20/22 1448  02/21/22 0928  WBC 2.9* 3.2* 3.3* 3.8* 4.4  HGB 7.7* 8.4* 8.6* 9.3* 9.2*  HCT 24.3* 25.9* 26.5* 28.8* 28.5*  MCV 100.4* 98.9 98.5 100.0 99.3  PLT 113* 124* 132* 133* 177*   Basic Metabolic Panel: Recent Labs  Lab 02/19/22 1421 02/19/22 1558 02/20/22 0613  NA 130*  --  133*  K 3.0*  --  3.5  CL 94*  --  99  CO2 25  --  24  GLUCOSE 332*  --  132*  BUN 20  --  23  CREATININE 2.48*  --  2.89*  CALCIUM 8.2*  --  8.4*  MG  --  1.7  --    Liver Function Tests: Recent Labs  Lab 02/19/22 1421  AST 55*  ALT 53*  ALKPHOS 381*  BILITOT 0.9  PROT 6.5  ALBUMIN 2.6*   CBG: Recent Labs  Lab 02/20/22 2012 02/20/22 2342 02/21/22 0406 02/21/22 0711 02/21/22 1120  GLUCAP 305* 160* 181* 167* 178*    Discharge time spent: greater than 30 minutes.  Signed: Orson Eva, MD [facility] 02/21/2022

## 2022-02-23 ENCOUNTER — Other Ambulatory Visit: Payer: Self-pay | Admitting: *Deleted

## 2022-02-23 ENCOUNTER — Telehealth: Payer: Self-pay | Admitting: *Deleted

## 2022-02-23 DIAGNOSIS — K7030 Alcoholic cirrhosis of liver without ascites: Secondary | ICD-10-CM

## 2022-02-23 LAB — TYPE AND SCREEN
ABO/RH(D): O POS
Antibody Screen: NEGATIVE
Unit division: 0
Unit division: 0

## 2022-02-23 LAB — BPAM RBC
Blood Product Expiration Date: 202310232359
Blood Product Expiration Date: 202310272359
ISSUE DATE / TIME: 202309211530
Unit Type and Rh: 5100
Unit Type and Rh: 5100

## 2022-02-23 LAB — GLUCOSE, CAPILLARY: Glucose-Capillary: 140 mg/dL — ABNORMAL HIGH (ref 70–99)

## 2022-02-23 NOTE — Telephone Encounter (Signed)
Order placed

## 2022-02-23 NOTE — Telephone Encounter (Signed)
-----   Message from Daneil Dolin, MD sent at 02/21/2022 11:15 AM EDT ----- Patient hospitalized with ascites and GI bleeding.  Needs outpatient referral to IR in [facility] for Consultation for TI PS.  Thanks

## 2022-03-03 ENCOUNTER — Inpatient Hospital Stay: Payer: Medicare Other

## 2022-03-03 ENCOUNTER — Inpatient Hospital Stay: Payer: Medicare Other | Attending: Hematology

## 2022-03-03 DIAGNOSIS — D509 Iron deficiency anemia, unspecified: Secondary | ICD-10-CM | POA: Diagnosis present

## 2022-03-03 DIAGNOSIS — N186 End stage renal disease: Secondary | ICD-10-CM

## 2022-03-03 DIAGNOSIS — D649 Anemia, unspecified: Secondary | ICD-10-CM

## 2022-03-03 DIAGNOSIS — D61818 Other pancytopenia: Secondary | ICD-10-CM

## 2022-03-03 DIAGNOSIS — E538 Deficiency of other specified B group vitamins: Secondary | ICD-10-CM | POA: Insufficient documentation

## 2022-03-03 DIAGNOSIS — D500 Iron deficiency anemia secondary to blood loss (chronic): Secondary | ICD-10-CM

## 2022-03-03 LAB — CBC WITH DIFFERENTIAL/PLATELET
Abs Immature Granulocytes: 0.02 10*3/uL (ref 0.00–0.07)
Basophils Absolute: 0.1 10*3/uL (ref 0.0–0.1)
Basophils Relative: 2 %
Eosinophils Absolute: 0.2 10*3/uL (ref 0.0–0.5)
Eosinophils Relative: 5 %
HCT: 25 % — ABNORMAL LOW (ref 39.0–52.0)
Hemoglobin: 7.8 g/dL — ABNORMAL LOW (ref 13.0–17.0)
Immature Granulocytes: 1 %
Lymphocytes Relative: 7 %
Lymphs Abs: 0.3 10*3/uL — ABNORMAL LOW (ref 0.7–4.0)
MCH: 31.7 pg (ref 26.0–34.0)
MCHC: 31.2 g/dL (ref 30.0–36.0)
MCV: 101.6 fL — ABNORMAL HIGH (ref 80.0–100.0)
Monocytes Absolute: 0.3 10*3/uL (ref 0.1–1.0)
Monocytes Relative: 8 %
Neutro Abs: 3.2 10*3/uL (ref 1.7–7.7)
Neutrophils Relative %: 77 %
Platelets: 128 10*3/uL — ABNORMAL LOW (ref 150–400)
RBC: 2.46 MIL/uL — ABNORMAL LOW (ref 4.22–5.81)
RDW: 18.6 % — ABNORMAL HIGH (ref 11.5–15.5)
WBC: 4 10*3/uL (ref 4.0–10.5)
nRBC: 0 % (ref 0.0–0.2)

## 2022-03-03 LAB — SAMPLE TO BLOOD BANK

## 2022-03-03 NOTE — Progress Notes (Signed)
Hemoglobin is 7.8 today. Patient states he feels good. He does not want blood today. Discharged home from clinic ambulatory. Follow up as scheduled.

## 2022-03-05 ENCOUNTER — Inpatient Hospital Stay: Payer: Medicare Other

## 2022-03-10 ENCOUNTER — Other Ambulatory Visit: Payer: Medicare Other

## 2022-03-12 ENCOUNTER — Other Ambulatory Visit: Payer: Self-pay | Admitting: Gastroenterology

## 2022-03-12 ENCOUNTER — Ambulatory Visit
Admission: RE | Admit: 2022-03-12 | Discharge: 2022-03-12 | Disposition: A | Discharge status: Home / Self Care | Payer: Medicare Other | Source: Ambulatory Visit | Attending: Internal Medicine | Admitting: Internal Medicine

## 2022-03-12 DIAGNOSIS — K7030 Alcoholic cirrhosis of liver without ascites: Secondary | ICD-10-CM

## 2022-03-12 DIAGNOSIS — Z8619 Personal history of other infectious and parasitic diseases: Secondary | ICD-10-CM

## 2022-03-12 NOTE — Consult Note (Signed)
Chief Complaint:  Abdominal ascites  Referring Physician(s): Rourk,Robert M  History of Present Illness: Jeffrey [name] is a 76 y.o. 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  He has been getting frequent paracenteses at [facility] since 2021 every 2 to 3 weeks.  Volumes vary from 1 to 5 L.  Of note, he is also end-stage renal disease on hemodialysis Monday Wednesday Friday.  He also has a history of hypertension and diabetes.  Iron deficiency anemia is followed by hematology requiring iron transfusions.  He is currently on daily Xifaxan and lactulose to control hepatic encephalopathy.  He is here today to discuss evaluation for TIPS procedure.  Most recent CT with contrast is from 04/01/2020.  Cirrhosis of the liver noted.  Portal vein is patent.  Gallstones evident.  Abdominal ascites and body anasarca evident.  No large esophageal or gastric varices.  Past Medical History:  Diagnosis Date   A-fib [facility])    when initially stating dialysis january 2021 at [facility] per spouse; never heard anything else about it   Alcoholic cirrhosis [facility])    patient reports completing hep a and b vaccines in 2006   Anemia    has had 3 units of prbcs 2013   Anxiety    Arthritis    Asymptomatic gallstones    Ultrasound in 2006   B12 deficiency 11/21/2020   C. difficile colitis    Cholelithiasis    Chronic kidney disease    Dialysis M/W/F/Sa   Depression    Diabetes ([place])    Duodenal ulcer with hemorrhage    per patient in 2006 or 2007, records have been requested   Dyspnea    low oxygen level -  has O2 2 L all day   Esophageal varices ([place])    see PSH   GERD (gastroesophageal reflux disease)    Heart burn    History of alcohol abuse    quit 05/2013   HOH (hard of hearing)    Hypertension    Liver cirrhosis ([place])    ETOH   SBP (spontaneous bacterial peritonitis)  ([place])    2020, November 2021.    Splenomegaly    Ultrasound in 2006    Past Surgical History:  Procedure Laterality Date   AGILE CAPSULE N/A 09/20/2019   Procedure: AGILE CAPSULE;  Surgeon: Daneil Dolin, MD;  [facility];  Service: Endoscopy;  Laterality: N/A;   APPENDECTOMY     AV FISTULA PLACEMENT Left 08/17/2019   Procedure: ARTERIOVENOUS (AV) FISTULA CREATION VERSES ARTERIOVENOUS GRAFT;  Surgeon: [name], MD;  Location: [facility] OR;  Service: Vascular;  Laterality: Left;   BASCILIC VEIN TRANSPOSITION Left 09/28/2019   Procedure: LEFT SECOND STAGE [place];  Surgeon: [name], MD;  Location: [place];  Service: Vascular;  Laterality: Left;   BIOPSY  02/03/2018   Procedure: BIOPSY;  Surgeon: Daneil Dolin, MD;  [facility];  Service: Endoscopy;;  bx of gastric polyps   BIOPSY  09/19/2019   Procedure: BIOPSY;  Surgeon: Danie Binder, MD;  [facility];  Service: Endoscopy;;   BIOPSY  01/16/2021   Procedure: BIOPSY;  Surgeon: Daneil Dolin, MD;  [facility];  Service: Endoscopy;;   CATARACT EXTRACTION Right    COLONOSCOPY  03/10/2005   Rectal polyp as described above, removed with snare. Left sided  diverticula. The remainder  of the colonic mucosa appeared normal. Inflammed polyp on path.   COLONOSCOPY  04/1999   Dr. Thea Silversmith polyps removed,  Path showed hyperplastic   COLONOSCOPY  10/2015   Dr. Britta Mccreedy: diverticulosis, single sessile tubular adenoma 3-4m in size removed from descending colon.    COLONOSCOPY WITH ESOPHAGOGASTRODUODENOSCOPY (EGD)  03/31/2012   RNOB:[id number]. Colonic diverticulosis. tubular adenoma colon   COLONOSCOPY WITH ESOPHAGOGASTRODUODENOSCOPY (EGD)  06/2019   FORSYTH: small esophageal varices without high risk stigmata, single large AVM on lesser curvature in gastric body s/p APC ablation, gastric antral and duodenal bulb polyposis. No significant source to explain transfusion  dependent anemia. Colonoscopy with portal colopathy and diffuse edema, changes of Cdiff colitis on colonoscopy   COLONOSCOPY WITH PROPOFOL N/A 01/19/2021   Surgeon: CDaryel November MD; 15 mm polyp in descending colon removed piecemeal, 6 mm polyp in ascending colon and 12 mm polyp in sigmoid colon not removed due to high bleeding risk, benign polypoid lesion at splenic flexure, diverticulosis in sigmoid and descending colon, rectal varices.   COLONOSCOPY WITH PROPOFOL N/A 01/16/2021   Surgeon: RDaneil Dolin MD;  Portal colopathy, large rectal varices, polypoid mass at splenic flexure biopsy, two 1 cm left colon polyps not removed, left-sided diverticulosis.  Reported patient had bleeding from splenic flexure lesion.  Pathology with acutely inflamed colonic mucosa with localized ulceration and prominent reactive changes.   ESOPHAGEAL BANDING N/A 02/03/2018   Procedure: ESOPHAGEAL BANDING;  Surgeon: RDaneil Dolin MD;  [facility];  Service: Endoscopy;  Laterality: N/A;   ESOPHAGEAL BANDING  01/16/2021   Procedure: ESOPHAGEAL BANDING;  Surgeon: RDaneil Dolin MD;  [facility];  Service: Endoscopy;;   ESOPHAGOGASTRODUODENOSCOPY  01/15/2005   Three columns grade 1 to 2 esophageal varices, otherwise normal esophageal mucosa.  Esophagus was not manipulated otherwise./Nodularity of the antrum with overlying erosions, nonspecific finding. Path showed rare H.pylori   ESOPHAGOGASTRODUODENOSCOPY  09/2008   Dr. RGaylene Brookscolumns of grade 2-3 esoph varices, only one column was prominent. Portal gastropathy, multiple gastrc polyps at antrum, two were 2cm with black eschar, bulbar polyps, bulbar erosions   ESOPHAGOGASTRODUODENOSCOPY  11/2004   Dr. FBrantley Stage3 esoph varices   ESOPHAGOGASTRODUODENOSCOPY  03/31/2012   RMR: 4 columns(3-GR 2, 1-GR1) non-bleeding esophageal varices, portal gastropathy, small HH, early GAVE, multiple gastric polyps     ESOPHAGOGASTRODUODENOSCOPY (EGD) WITH PROPOFOL N/A 02/03/2018   Dr. RGala Romney Esophageal varices, 3 columns grade 1-2.  Portal hypertensive gastropathy.  Multiple gastric polyps, biopsy consistent with hyperplastic.   ESOPHAGOGASTRODUODENOSCOPY (EGD) WITH PROPOFOL N/A 09/19/2019   Fields: grade I and II esophageal varcies, mild portal hypertensive gastropathy, moderate gastritis but no H. pylori, single hyperplastic gastric polyp removed, obvious source for melena/transfusion dependent anemia not identified, may be due to friable gastric and duodenal mucosa in the setting of portal hypertension   ESOPHAGOGASTRODUODENOSCOPY (EGD) WITH PROPOFOL N/A 01/16/2021   Surgeon: RDaneil Dolin MD; Portal hypertensive gastropathy, grade 2 esophageal varices with bleeding stigmata s/p banding x4, slight nodularity and congestion involving bulb and second portion of duodenum, nonspecific.  Recommended repeat EGD in 4 weeks.   ESOPHAGOGASTRODUODENOSCOPY (EGD) WITH PROPOFOL N/A 05/22/2021   stigmata of prior esophageal banding and no significant varices seen. Portal gastropathy. Polypoid gastric and duodenal mucosa with overlying erosions, marked mucosal friability.   ESOPHAGOGASTRODUODENOSCOPY (EGD) WITH PROPOFOL N/A 02/06/2022   Procedure: ESOPHAGOGASTRODUODENOSCOPY (EGD) WITH PROPOFOL;  Surgeon: CHarvel Quale MD;  [facility];  Service: Gastroenterology;  Laterality: N/A;  GASTRIC VARICES BANDING  03/31/2012   Procedure: GASTRIC VARICES BANDING;  Surgeon: Daneil Dolin, MD;  [facility];  Service: Endoscopy;  Laterality: N/A;   GIVENS CAPSULE STUDY N/A 09/21/2019   Poor study with a lot of debris obstructing much of the view of the first approximate 3 hours out of 6 hours.  No obvious source of bleeding identified.  Sequela of bleeding and old blood seen in the form of"whisps" of blood, blood flecks mostly toward the end of the study.   HEMOSTASIS CLIP PLACEMENT  01/19/2021    Procedure: HEMOSTASIS CLIP PLACEMENT;  Surgeon: [name], MD;  Location[facility] ENDOSCOPY;  Service: Gastroenterology;;   HEMOSTASIS CLIP PLACEMENT  02/06/2022   Procedure: HEMOSTASIS CLIP PLACEMENT;  Surgeon: Montez Morita, Quillian Quince, MD;  [facility];  Service: Gastroenterology;;  gastric body   HOT HEMOSTASIS  02/06/2022   Procedure: HOT HEMOSTASIS (ARGON PLASMA COAGULATION/BICAP);  Surgeon: Montez Morita, Quillian Quince, MD;  [facility];  Service: Gastroenterology;;   IR REMOVAL TUN CV CATH W/O [facility]  02/15/2020   POLYPECTOMY  09/19/2019   Procedure: POLYPECTOMY;  Surgeon: Danie Binder, MD;  [facility];  Service: Endoscopy;;   POLYPECTOMY  01/19/2021   Procedure: POLYPECTOMY;  Surgeon: [name], MD;  Location: [facility] ENDOSCOPY;  Service: Gastroenterology;;   UMBILICAL HERNIA REPAIR  2017   exploratory laparotomy, abdominal washout    Allergies: Tape  Medications: Prior to Admission medications   Medication Sig Start Date End Date Taking? Authorizing Provider  acetaminophen (TYLENOL) 650 MG CR tablet Take 650 mg by mouth every 8 (eight) hours as needed for pain.    [provider]  Acidophilus Lactobacillus CAPS Take 1 tablet by mouth daily.    [provider]  Alcohol Swabs (ALCOHOL PREP) 70 % PADS Apply 1 each topically 3 (three) times daily. 01/31/20   [provider]  allopurinol (ZYLOPRIM) 300 MG tablet Take 300 mg by mouth daily. 12/11/21   [provider]  ALPRAZolam Duanne Moron) 0.5 MG tablet Take 1 tablet (0.5 mg total) by mouth See admin instructions. Take one tablet (0.5 mg) by mouth on Monday, Wednesday, Friday before dialysis, may also take one tablet (0.5 mg) daily as needed for anxiety. 02/07/22   Orson Eva, MD  Ascorbic Acid (VITAMIN C) 1000 MG tablet Take 1,000 mg by mouth in the morning.    [provider]  BD PEN NEEDLE MICRO U/F 32G X 6 MM MISC SMARTSIG:SUB-Q Daily PRN 04/23/21    [provider]  calcium carbonate (TUMS - DOSED IN MG ELEMENTAL CALCIUM) 500 MG chewable tablet Chew 1 tablet by mouth 3 (three) times daily with meals.    [provider]  cetirizine (ZYRTEC) 10 MG tablet Take 10 mg by mouth daily. 06/24/21   [provider]  cholecalciferol (VITAMIN D3) 25 MCG (1000 UNIT) tablet Take 1,000 Units by mouth daily.    [provider]  Continuous Blood Gluc Sensor (FREESTYLE LIBRE 2 SENSOR) MISC Apply topically. 10/15/21   [provider]  diltiazem (TIAZAC) 120 MG 24 hr capsule Take 120 mg by mouth daily. 02/17/22   [provider]  famotidine (PEPCID) 20 MG tablet Take 1 tablet (20 mg total) by mouth 2 (two) times daily. 07/30/21 07/30/22  Barton Dubois, MD  furosemide (LASIX) 40 MG tablet Take 80 mg by mouth daily. 12/29/19   [provider]  GLUCAGEN HYPOKIT 1 MG SOLR injection Inject 1 mg into the  muscle once as needed for low blood sugar. 11/28/20   [provider]  HUMALOG KWIKPEN 100 UNIT/ML KwikPen 10-50 Units 3 (three) times daily. Sliding scale 04/23/21   [provider]  hydrOXYzine (ATARAX/VISTARIL) 25 MG tablet Take 25 mg by mouth 3 (three) times daily. For itching 11/23/20   [provider]  iron polysaccharides (NIFEREX) 150 MG capsule Take 150 mg by mouth daily with supper.    [provider]  lactulose (CHRONULAC) 10 GM/15ML solution Take 15 mLs (10 g total) by mouth 2 (two) times daily. 02/07/22   Orson Eva, MD  metoprolol succinate (TOPROL-XL) 25 MG 24 hr tablet Take 1 tablet (25 mg total) by mouth daily. 02/22/22   Orson Eva, MD  mirtazapine (REMERON) 30 MG tablet Take 1 tablet (30 mg total) by mouth at bedtime. 02/10/20   Roxan Hockey, MD  Multiple Vitamin (MULTIVITAMIN WITH MINERALS) TABS tablet Take 1 tablet by mouth in the morning.     [provider]  omega-3 acid ethyl esters (LOVAZA) 1 g capsule Take 2 g by mouth 2 (two) times daily.   04/07/19   [provider]  ondansetron (ZOFRAN) 4 MG tablet Take 4 mg by mouth every 8 (eight) hours as needed for nausea or vomiting.    [provider]  pantoprazole (PROTONIX) 40 MG tablet Take 1 tablet (40 mg total) by mouth 2 (two) times daily. 02/21/22   Orson Eva, MD  pravastatin (PRAVACHOL) 20 MG tablet Take 20 mg by mouth in the morning.     [provider]  sulfamethoxazole-trimethoprim (BACTRIM DS) 800-160 MG tablet TAKE 1 TABLET BY MOUTH every friday AFTER dialysis 03/12/22   Annitta Needs, NP  thiamine (VITAMIN B-1) 100 MG tablet Take 100 mg by mouth daily. 08/27/20   [provider]  TRESIBA FLEXTOUCH 200 UNIT/ML FlexTouch Pen Inject into the skin. Patient not taking: Reported on 02/19/2022 02/17/22   [provider]  triamcinolone cream (KENALOG) 0.1 % Apply 1 application topically 2 (two) times daily as needed (dry skin Itching). 12/30/17   [provider]  venlafaxine (EFFEXOR) 75 MG tablet Take 75 mg by mouth 3 (three) times daily with meals.     [provider]  XIFAXAN 550 MG TABS tablet TAKE 1 TABLET BY MOUTH TWICE DAILY 02/12/22   Annitta Needs, NP     Family History  Problem Relation Age of Onset   Colon cancer Father 48       deceased age 54   Breast cancer Sister        deceased   Diabetes Sister    Stroke Mother    Healthy Son    Healthy Daughter    Lung cancer Neg Hx    Ovarian cancer Neg Hx     Social History   Socioeconomic History   Marital status: Married    Spouse name: Not on file   Number of children: 2   Years of education: Not on file   Highest education level: Not on file  Occupational History   Occupation: RETIRED    Employer: SELF EMPLOYED  Tobacco Use   Smoking status: Former    Packs/day: 2.00    Years: 25.00    Total pack years: 50.00    Types: Cigarettes    Quit date: 01/28/1986    Years since quitting: 36.1   Smokeless tobacco: Never  Vaping Use   Vaping Use: Never used   Substance and Sexual Activity   Alcohol  use: No    Comment: quit in 05/2013   Drug use: No   Sexual activity: Not Currently  Other Topics Concern   Not on file  Social History Narrative   Two step children from second marriage (divorced) who live with him along with some grandchildren.    Social Determinants of Health   Financial Resource Strain: Not on file  Food Insecurity: No Food Insecurity (09/30/2021)   Hunger Vital Sign    Worried About Running Out of Food in the Last Year: Never true    Ran Out of Food in the Last Year: Never true  Transportation Needs: No Transportation Needs (09/30/2021)   PRAPARE - Hydrologist (Medical): No    Lack of Transportation (Non-Medical): No  Physical Activity: Inactive (05/30/2020)   Exercise Vital Sign    Days of Exercise per Week: 0 days    Minutes of Exercise per Session: 0 min  Stress: Not on file  Social Connections: Not on file     Review of Systems: A 12 point ROS discussed and pertinent positives are indicated in the HPI above.  All other systems are negative.  Review of Systems  Vital Signs: BP (!) 134/59 (BP Location: Right Arm)   Pulse 84   SpO2 96%     Physical Exam Constitutional:      Appearance: He is not ill-appearing or toxic-appearing.  Eyes:     General: No scleral icterus.    Conjunctiva/sclera: Conjunctivae normal.  Cardiovascular:     Rate and Rhythm: Normal rate and regular rhythm.     Heart sounds: Murmur heard.  Pulmonary:     Effort: Pulmonary effort is normal.     Breath sounds: Normal breath sounds.  Abdominal:     General: Bowel sounds are normal. There is distension.     Palpations: Abdomen is soft.     Tenderness: There is no abdominal tenderness.  Musculoskeletal:     Right lower leg: No edema.     Left lower leg: No edema.  Skin:    Coloration: Skin is not jaundiced.  Neurological:     General: No focal deficit present.     Mental Status: Mental status is  at baseline.     Comments: No current signs of acute encephalopathy.  Psychiatric:        Mood and Affect: Mood normal.        Thought Content: Thought content normal.        Imaging: US Paracentesis  Result Date: 02/20/2022 INDICATION: Ascites. EXAM: ULTRASOUND GUIDED therapeutic PARACENTESIS MEDICATIONS: None. COMPLICATIONS: None immediate. PROCEDURE: Informed written consent was obtained from the patient after a discussion of the risks, benefits and alternatives to treatment. A timeout was performed prior to the initiation of the procedure. Initial ultrasound scanning demonstrates a large amount of ascites within the left lower abdominal quadrant. The left lower abdomen was prepped and draped in the usual sterile fashion. 1% lidocaine was used for local anesthesia. Following this, a Safe-T-Centesis catheter was introduced. An ultrasound image was saved for documentation purposes. The paracentesis was performed. The catheter was removed and a dressing was applied. The patient tolerated the procedure well without immediate post procedural complication. FINDINGS: A total of approximately 5 L of serous fluid was removed. IMPRESSION: Successful ultrasound-guided paracentesis yielding 5 liters of peritoneal fluid. Electronically Signed   By: Marijo Conception M.D.   On: 02/20/2022 15:58    Labs:  CBC: Recent Labs  02/20/22 7619 02/20/22 1448 02/21/22 0928 03/03/22 0854  WBC 3.3* 3.8* 4.4 4.0  HGB 8.6* 9.3* 9.2* 7.8*  HCT 26.5* 28.8* 28.5* 25.0*  PLT 132* 133* 142* 128*    COAGS: Recent Labs    04/11/21 0643 04/14/21 1023 08/10/21 1141 02/19/22 1421  INR 1.2 1.3* 1.2 1.2    BMP: Recent Labs    02/06/22 0555 02/07/22 0519 02/19/22 1421 02/20/22 0613  NA 132* 129* 130* 133*  K 3.0* 3.7 3.0* 3.5  CL 94* 94* 94* 99  CO2 '29 26 25 24  '$ GLUCOSE 232* 239* 332* 132*  BUN 22 35* 20 23  CALCIUM 8.2* 8.2* 8.2* 8.4*  CREATININE 3.29* 4.31* 2.48* 2.89*  GFRNONAA 19* 14* 26* 22*     LIVER FUNCTION TESTS: Recent Labs    09/18/21 0830 02/02/22 1327 02/04/22 0250 02/04/22 0301 02/05/22 0306 02/06/22 0555 02/07/22 0519 02/19/22 1421  BILITOT 0.7 1.2 1.4*  --   --   --   --  0.9  AST 20 46* 37  --   --   --   --  55*  ALT 21 32 25  --   --   --   --  53*  ALKPHOS 224* 144* 122  --   --   --   --  381*  PROT 6.9 6.6 5.9*  --   --   --   --  6.5  ALBUMIN 2.3* 2.8* 2.4*   < > 2.5* 2.5* 2.6* 2.6*   < > = values in this interval not displayed.     Assessment and Plan:  Chronic decompensated alcoholic cirrhosis with abdominal ascites requiring frequent paracenteses every 2 to 3 weeks.  Recurrent GI bleeding, likely multifactorial.  Patient has multiple comorbidities including being hemodialysis dependent.  He is also fairly debilitated from his chronic illnesses with a very limited functional status/reserve.  Calculated sodium MELD score is 25.  72-monthmortality risk estimated at 20-30%, suspect higher in hemodialysis patients.  TIPS procedure was reviewed in detail with the patient including the procedure, need for general anesthesia, and risk of the procedure with a high MELD score.  Hepatic encephalopathy may also be challenging to control following TIPS.  Therefore, I would not recommend TIPS procedure in this patient.  Even a successful TIPS at this point could potentially make him worse clinically.  Plan: Continue medical management of decompensated hepatic cirrhosis.  The patient and his spouse are in agreement with this plan.  Thank you for this interesting consult.  I greatly enjoyed meeting Jeffrey SULEIMANand look forward to participating in their care.  A copy of this report was sent to the requesting provider on this date.  Electronically Signed: MGreggory Keen10/05/2022, 2:59 PM   I spent a total of  60 Minutes   in face to face in clinical consultation, greater than 50% of which was counseling/coordinating care for this patient with decompensated  hepatic cirrhosis

## 2022-03-16 ENCOUNTER — Inpatient Hospital Stay: Payer: Medicare Other

## 2022-03-16 ENCOUNTER — Other Ambulatory Visit: Payer: Medicare Other

## 2022-03-17 ENCOUNTER — Ambulatory Visit (INDEPENDENT_AMBULATORY_CARE_PROVIDER_SITE_OTHER): Payer: Medicare Other | Admitting: Urology

## 2022-03-17 DIAGNOSIS — R339 Retention of urine, unspecified: Secondary | ICD-10-CM | POA: Diagnosis not present

## 2022-03-17 DIAGNOSIS — N138 Other obstructive and reflux uropathy: Secondary | ICD-10-CM

## 2022-03-17 NOTE — Progress Notes (Signed)
Cath Change/ Replacement  Patient is present today for a catheter change due to urinary retention.  53m of water was removed from the balloon, a 16FR foley cath was removed without difficulty.  Patient was cleaned and prepped in a sterile fashion with betadine and 2% lidocaine jelly was instilled into the urethra. A 16 FR foley cath was replaced into the bladder, no complications were noted. Urine return was noted 336mand urine was yellow in color. The balloon was filled with 1019mf sterile water. A leg bag was attached for drainage.  A night bag was also given to the patient and patient was given instruction on how to change from one bag to another. Patient was given proper instruction on catheter care.    Performed by: Stacy Deshler LPN  Follow up: 1 month [place]

## 2022-03-17 NOTE — Patient Instructions (Signed)

## 2022-03-19 ENCOUNTER — Inpatient Hospital Stay: Payer: Medicare Other

## 2022-03-19 VITALS — BP 140/57 | HR 71 | Temp 97.5°F | Resp 18

## 2022-03-19 DIAGNOSIS — D500 Iron deficiency anemia secondary to blood loss (chronic): Secondary | ICD-10-CM

## 2022-03-19 DIAGNOSIS — D509 Iron deficiency anemia, unspecified: Secondary | ICD-10-CM | POA: Diagnosis not present

## 2022-03-19 DIAGNOSIS — D649 Anemia, unspecified: Secondary | ICD-10-CM

## 2022-03-19 DIAGNOSIS — E538 Deficiency of other specified B group vitamins: Secondary | ICD-10-CM

## 2022-03-19 LAB — CBC WITH DIFFERENTIAL/PLATELET
Abs Immature Granulocytes: 0.02 10*3/uL (ref 0.00–0.07)
Basophils Absolute: 0 10*3/uL (ref 0.0–0.1)
Basophils Relative: 1 %
Eosinophils Absolute: 0.1 10*3/uL (ref 0.0–0.5)
Eosinophils Relative: 3 %
HCT: 25.1 % — ABNORMAL LOW (ref 39.0–52.0)
Hemoglobin: 7.5 g/dL — ABNORMAL LOW (ref 13.0–17.0)
Immature Granulocytes: 1 %
Lymphocytes Relative: 6 %
Lymphs Abs: 0.2 10*3/uL — ABNORMAL LOW (ref 0.7–4.0)
MCH: 32.8 pg (ref 26.0–34.0)
MCHC: 29.9 g/dL — ABNORMAL LOW (ref 30.0–36.0)
MCV: 109.6 fL — ABNORMAL HIGH (ref 80.0–100.0)
Monocytes Absolute: 0.3 10*3/uL (ref 0.1–1.0)
Monocytes Relative: 8 %
Neutro Abs: 2.7 10*3/uL (ref 1.7–7.7)
Neutrophils Relative %: 81 %
Platelets: 111 10*3/uL — ABNORMAL LOW (ref 150–400)
RBC: 2.29 MIL/uL — ABNORMAL LOW (ref 4.22–5.81)
RDW: 22.2 % — ABNORMAL HIGH (ref 11.5–15.5)
WBC: 3.3 10*3/uL — ABNORMAL LOW (ref 4.0–10.5)
nRBC: 0 % (ref 0.0–0.2)

## 2022-03-19 LAB — SAMPLE TO BLOOD BANK

## 2022-03-19 LAB — PREPARE RBC (CROSSMATCH)

## 2022-03-19 MED ORDER — SODIUM CHLORIDE 0.9% IV SOLUTION
250.0000 mL | Freq: Once | INTRAVENOUS | Status: AC
  Administered 2022-03-19: 250 mL via INTRAVENOUS

## 2022-03-19 MED ORDER — SODIUM CHLORIDE 0.9 % IV SOLN: Freq: Once | INTRAVENOUS | Status: AC

## 2022-03-19 MED ORDER — DIPHENHYDRAMINE HCL 25 MG PO CAPS: 25.0000 mg | ORAL_CAPSULE | Freq: Once | ORAL | Status: DC

## 2022-03-19 MED ORDER — CYANOCOBALAMIN 1000 MCG/ML IJ SOLN
1000.0000 ug | Freq: Once | INTRAMUSCULAR | Status: AC
  Administered 2022-03-19: 1000 ug via INTRAMUSCULAR
  Filled 2022-03-19: qty 1

## 2022-03-19 MED ORDER — LORATADINE 10 MG PO TABS
10.0000 mg | ORAL_TABLET | Freq: Once | ORAL | Status: AC
  Administered 2022-03-19: 10 mg via ORAL
  Filled 2022-03-19: qty 1

## 2022-03-19 MED ORDER — SODIUM CHLORIDE 0.9 % IV SOLN
300.0000 mg | Freq: Once | INTRAVENOUS | Status: AC
  Administered 2022-03-19: 300 mg via INTRAVENOUS
  Filled 2022-03-19: qty 300

## 2022-03-19 MED ORDER — ACETAMINOPHEN 325 MG PO TABS
650.0000 mg | ORAL_TABLET | Freq: Once | ORAL | Status: AC
  Administered 2022-03-19: 650 mg via ORAL
  Filled 2022-03-19: qty 2

## 2022-03-19 NOTE — Progress Notes (Signed)
Pt presents today for Venofer IV iron and B12 injection per provider's order. Vital sign stable, pt's hemoglobin is 7.5 today and pt c/o shortness of breath, fatigue, and weakness. Message sent to [facility] and she stated to give 1 unit of blood today and proceed with IV iron and B12 injection.

## 2022-03-19 NOTE — Progress Notes (Signed)
Stable during transfusion without adverse affects.  Vital signs stable.  No complaints at this time.  Discharge from clinic ambulatory in stable condition.  Alert and oriented X 3.  Follow up with [facility] as scheduled.

## 2022-03-19 NOTE — Patient Instructions (Signed)
[facility]  Discharge Instructions: Thank you for choosing [facility] to provide your oncology and hematology care.  If you have a lab appointment with the [facility], please come in thru the Main Entrance and check in at the main information desk.  Wear comfortable clothing and clothing appropriate for easy access to any Portacath or PICC line.   We strive to give you quality time with your provider. You may need to reschedule your appointment if you arrive late (15 or more minutes).  Arriving late affects you and other patients whose appointments are after yours.  Also, if you miss three or more appointments without notifying the office, you may be dismissed from the clinic at the provider's discretion.      For prescription refill requests, have your pharmacy contact our office and allow 72 hours for refills to be completed.    Today you received the following chemotherapy and/or immunotherapy agents B12 blood transfusion      To help prevent nausea and vomiting after your treatment, we encourage you to take your nausea medication as directed.  BELOW ARE SYMPTOMS THAT SHOULD BE REPORTED IMMEDIATELY: *FEVER GREATER THAN 100.4 F (38 C) OR HIGHER *CHILLS OR SWEATING *NAUSEA AND VOMITING THAT IS NOT CONTROLLED WITH YOUR NAUSEA MEDICATION *UNUSUAL SHORTNESS OF BREATH *UNUSUAL BRUISING OR BLEEDING *URINARY PROBLEMS (pain or burning when urinating, or frequent urination) *BOWEL PROBLEMS (unusual diarrhea, constipation, pain near the anus) TENDERNESS IN MOUTH AND THROAT WITH OR WITHOUT PRESENCE OF ULCERS (sore throat, sores in mouth, or a toothache) UNUSUAL RASH, SWELLING OR PAIN  UNUSUAL VAGINAL DISCHARGE OR ITCHING   Items with * indicate a potential emergency and should be followed up as soon as possible or go to the Emergency Department if any problems should occur.  Please show the CHEMOTHERAPY ALERT CARD or IMMUNOTHERAPY ALERT CARD at check-in to  the Emergency Department and triage nurse.  Should you have questions after your visit or need to cancel or reschedule your appointment, please contact [facility] [phone]  and follow the prompts.  Office hours are 8:00 a.m. to 4:30 p.m. Monday - Friday. Please note that voicemails left after 4:00 p.m. may not be returned until the following business day.  We are closed weekends and major holidays. You have access to a nurse at all times for urgent questions. Please call the main number to the clinic [phone] and follow the prompts.  For any non-urgent questions, you may also contact your provider using MyChart. We now offer e-Visits for anyone 19 and older to request care online for non-urgent symptoms. For details visit [email or web address].   Also download the MyChart app! Go to the app store, search "MyChart", open the app, select [facility], and log in with your MyChart username and password.  Masks are optional in the cancer centers. If you would like for your care team to wear a mask while they are taking care of you, please let them know. You may have one support person who is at least 75 years old accompany you for your appointments.

## 2022-03-20 LAB — TYPE AND SCREEN
ABO/RH(D): O POS
Antibody Screen: NEGATIVE
Unit division: 0

## 2022-03-20 LAB — BPAM RBC
Blood Product Expiration Date: 202311202359
ISSUE DATE / TIME: 202310191615
Unit Type and Rh: 5100

## 2022-03-24 ENCOUNTER — Other Ambulatory Visit: Payer: Self-pay | Admitting: *Deleted

## 2022-03-24 ENCOUNTER — Telehealth: Payer: Self-pay | Admitting: *Deleted

## 2022-03-24 DIAGNOSIS — R188 Other ascites: Secondary | ICD-10-CM

## 2022-03-24 NOTE — Telephone Encounter (Signed)
Received message patient called to schedule paracentesis. Per Dr. Gala Romney okay to order. Max 4Liters, 25% albumin, 25 grams.

## 2022-03-24 NOTE — Telephone Encounter (Signed)
Dr. Gala Romney, see notes regarding TIPS eval. Doesn't seem he is a candidate for TIPS. Do you want [place] to do standing orders for PARA for pt? Has appt with [name] on 11/14.

## 2022-03-26 ENCOUNTER — Encounter (HOSPITAL_COMMUNITY): Payer: Self-pay

## 2022-03-26 ENCOUNTER — Ambulatory Visit (HOSPITAL_COMMUNITY)
Admission: RE | Admit: 2022-03-26 | Discharge: 2022-03-26 | Disposition: A | Discharge status: Home / Self Care | Payer: Medicare Other | Source: Ambulatory Visit | Attending: Internal Medicine | Admitting: Internal Medicine

## 2022-03-26 DIAGNOSIS — R188 Other ascites: Secondary | ICD-10-CM | POA: Insufficient documentation

## 2022-03-26 LAB — BODY FLUID CELL COUNT WITH DIFFERENTIAL
Eos, Fluid: 4 %
Lymphs, Fluid: 66 %
Monocyte-Macrophage-Serous Fluid: 24 % — ABNORMAL LOW (ref 50–90)
Neutrophil Count, Fluid: 6 % (ref 0–25)
Total Nucleated Cell Count, Fluid: 41 cu mm (ref 0–1000)

## 2022-03-26 LAB — GRAM STAIN

## 2022-03-26 MED ORDER — ALBUMIN HUMAN 25 % IV SOLN: 0.0000 g | Freq: Once | INTRAVENOUS | Status: AC

## 2022-03-26 MED ORDER — ALBUMIN HUMAN 25 % IV SOLN
INTRAVENOUS | Status: AC
  Administered 2022-03-26: 25 g via INTRAVENOUS
  Filled 2022-03-26: qty 100

## 2022-03-26 NOTE — Progress Notes (Signed)
PT tolerated left sided paracentesis procedure and 25G of IV albumin well today and 3.2 liters of cloudy yellow ascites removed with labs collected and sent for processing. PT verbalized understanding of discharge instructions and left via wheelchair with family with no acute distress noted.

## 2022-03-26 NOTE — Procedures (Signed)
   US guided LLQ paracentesis  3.2 L cloudy tan fluid Sent for labs per MD  Tolerated well  EBL: scant

## 2022-03-26 NOTE — Telephone Encounter (Signed)
Noted. FYI to leslie

## 2022-03-27 LAB — PATHOLOGIST SMEAR REVIEW

## 2022-03-31 ENCOUNTER — Other Ambulatory Visit: Payer: Medicare Other

## 2022-03-31 LAB — CULTURE, BODY FLUID W GRAM STAIN -BOTTLE: Culture: NO GROWTH

## 2022-04-02 ENCOUNTER — Inpatient Hospital Stay: Payer: Medicare Other | Attending: Hematology

## 2022-04-02 ENCOUNTER — Inpatient Hospital Stay: Payer: Medicare Other

## 2022-04-02 VITALS — BP 126/49 | HR 72 | Temp 98.1°F | Resp 16

## 2022-04-02 DIAGNOSIS — D509 Iron deficiency anemia, unspecified: Secondary | ICD-10-CM | POA: Insufficient documentation

## 2022-04-02 DIAGNOSIS — E538 Deficiency of other specified B group vitamins: Secondary | ICD-10-CM | POA: Diagnosis present

## 2022-04-02 DIAGNOSIS — D500 Iron deficiency anemia secondary to blood loss (chronic): Secondary | ICD-10-CM

## 2022-04-02 DIAGNOSIS — D649 Anemia, unspecified: Secondary | ICD-10-CM

## 2022-04-02 LAB — CBC WITH DIFFERENTIAL/PLATELET
Abs Immature Granulocytes: 0.01 10*3/uL (ref 0.00–0.07)
Basophils Absolute: 0 10*3/uL (ref 0.0–0.1)
Basophils Relative: 1 %
Eosinophils Absolute: 0.1 10*3/uL (ref 0.0–0.5)
Eosinophils Relative: 2 %
HCT: 23.4 % — ABNORMAL LOW (ref 39.0–52.0)
Hemoglobin: 7.2 g/dL — ABNORMAL LOW (ref 13.0–17.0)
Immature Granulocytes: 0 %
Lymphocytes Relative: 5 %
Lymphs Abs: 0.2 10*3/uL — ABNORMAL LOW (ref 0.7–4.0)
MCH: 31.9 pg (ref 26.0–34.0)
MCHC: 30.8 g/dL (ref 30.0–36.0)
MCV: 103.5 fL — ABNORMAL HIGH (ref 80.0–100.0)
Monocytes Absolute: 0.2 10*3/uL (ref 0.1–1.0)
Monocytes Relative: 6 %
Neutro Abs: 3.1 10*3/uL (ref 1.7–7.7)
Neutrophils Relative %: 86 %
Platelets: 106 10*3/uL — ABNORMAL LOW (ref 150–400)
RBC: 2.26 MIL/uL — ABNORMAL LOW (ref 4.22–5.81)
RDW: 19.8 % — ABNORMAL HIGH (ref 11.5–15.5)
WBC: 3.6 10*3/uL — ABNORMAL LOW (ref 4.0–10.5)
nRBC: 0 % (ref 0.0–0.2)

## 2022-04-02 LAB — SAMPLE TO BLOOD BANK

## 2022-04-02 LAB — PREPARE RBC (CROSSMATCH)

## 2022-04-02 MED ORDER — SODIUM CHLORIDE 0.9% IV SOLUTION
250.0000 mL | Freq: Once | INTRAVENOUS | Status: AC
  Administered 2022-04-02: 250 mL via INTRAVENOUS

## 2022-04-02 MED ORDER — ACETAMINOPHEN 325 MG PO TABS
650.0000 mg | ORAL_TABLET | Freq: Once | ORAL | Status: AC
  Administered 2022-04-02: 650 mg via ORAL
  Filled 2022-04-02: qty 2

## 2022-04-02 MED ORDER — DIPHENHYDRAMINE HCL 25 MG PO CAPS
25.0000 mg | ORAL_CAPSULE | Freq: Once | ORAL | Status: AC
  Administered 2022-04-02: 25 mg via ORAL
  Filled 2022-04-02: qty 1

## 2022-04-02 NOTE — Progress Notes (Signed)
One unit of blood given per orders. Patient tolerated it well without problems. Vitals stable and discharged home from clinic via wheelchair. Follow up as scheduled.  

## 2022-04-02 NOTE — Patient Instructions (Signed)
[facility]  Discharge Instructions: Thank you for choosing [facility] to provide your oncology and hematology care.  If you have a lab appointment with the [facility], please come in thru the Main Entrance and check in at the main information desk.  Wear comfortable clothing and clothing appropriate for easy access to any Portacath or PICC line.   We strive to give you quality time with your provider. You may need to reschedule your appointment if you arrive late (15 or more minutes).  Arriving late affects you and other patients whose appointments are after yours.  Also, if you miss three or more appointments without notifying the office, you may be dismissed from the clinic at the provider's discretion.      For prescription refill requests, have your pharmacy contact our office and allow 72 hours for refills to be completed.  You got one unit of blood today per orders.     To help prevent nausea and vomiting after your treatment, we encourage you to take your nausea medication as directed.  BELOW ARE SYMPTOMS THAT SHOULD BE REPORTED IMMEDIATELY: *FEVER GREATER THAN 100.4 F (38 C) OR HIGHER *CHILLS OR SWEATING *NAUSEA AND VOMITING THAT IS NOT CONTROLLED WITH YOUR NAUSEA MEDICATION *UNUSUAL SHORTNESS OF BREATH *UNUSUAL BRUISING OR BLEEDING *URINARY PROBLEMS (pain or burning when urinating, or frequent urination) *BOWEL PROBLEMS (unusual diarrhea, constipation, pain near the anus) TENDERNESS IN MOUTH AND THROAT WITH OR WITHOUT PRESENCE OF ULCERS (sore throat, sores in mouth, or a toothache) UNUSUAL RASH, SWELLING OR PAIN  UNUSUAL VAGINAL DISCHARGE OR ITCHING   Items with * indicate a potential emergency and should be followed up as soon as possible or go to the Emergency Department if any problems should occur.  Please show the CHEMOTHERAPY ALERT CARD or IMMUNOTHERAPY ALERT CARD at check-in to the Emergency Department and triage nurse.  Should  you have questions after your visit or need to cancel or reschedule your appointment, please contact [facility] [phone]  and follow the prompts.  Office hours are 8:00 a.m. to 4:30 p.m. Monday - Friday. Please note that voicemails left after 4:00 p.m. may not be returned until the following business day.  We are closed weekends and major holidays. You have access to a nurse at all times for urgent questions. Please call the main number to the clinic [phone] and follow the prompts.  For any non-urgent questions, you may also contact your provider using MyChart. We now offer e-Visits for anyone 86 and older to request care online for non-urgent symptoms. For details visit [email or web address].   Also download the MyChart app! Go to the app store, search "MyChart", open the app, select [facility], and log in with your MyChart username and password.  Masks are optional in the cancer centers. If you would like for your care team to wear a mask while they are taking care of you, please let them know. You may have one support person who is at least 75 years old accompany you for your appointments.

## 2022-04-03 LAB — TYPE AND SCREEN
ABO/RH(D): O POS
Antibody Screen: NEGATIVE
Unit division: 0

## 2022-04-03 LAB — BPAM RBC
Blood Product Expiration Date: 202312052359
ISSUE DATE / TIME: 202311021115
Unit Type and Rh: 5100

## 2022-04-07 ENCOUNTER — Inpatient Hospital Stay: Payer: Medicare Other

## 2022-04-07 ENCOUNTER — Ambulatory Visit: Payer: Medicare Other | Admitting: Physician Assistant

## 2022-04-08 ENCOUNTER — Other Ambulatory Visit: Payer: Self-pay | Admitting: *Deleted

## 2022-04-08 DIAGNOSIS — M10349 Gout due to renal impairment, unspecified hand: Secondary | ICD-10-CM

## 2022-04-13 NOTE — Progress Notes (Addendum)
GI Office Note    Referring Provider: Monico Blitz, MD Primary Care Physician:  Monico Blitz, MD  Primary Gastroenterologist: Garfield Cornea, MD   Chief Complaint   Chief Complaint  Patient presents with   hospital followup    Was in hospital in Sept for rectal bleeding and cirrhosis.    History of Present Illness   Jeffrey [name] is a 76 y.o. male presenting today for hospital follow up (hospitalized in 01/2022). H/o decompensated end-stage alcoholic cirrhosis (quit in 6812) complicated by variceal hemorrhage, hepatic encephalopathy and SBP along with end-stage renal disease on hemodialysis. H/O Afib not anticoagulated, CHF, HTN, DM, IDA followed by hematology. Recent admission for melena, symptomatic anemia.   Patient went for TIPS eval and not a good candidate. It was felt it could make him worse clinically. Couple of times per week sees brbpr. Sometimes sees a lot of blood. Sometimes not that much. Stools have been darker but not black. He feels like lactulose makes bleeding worse. Sometimes takes it only once after dialysis or not at all. Has trouble getting doses in on days with appointments/dialysis. Still takes Xifaxan BID. He denies confusion. No abdominal pain. He has been averaging LVAP once every 4 weeks or so since 12/2021. Last LVAP 03/26/22, 3 liters.   He is due for RUQ U/S for hepatoma screening next month. We will update labs at that time.   Prior endoscopic work up: EGD 02/06/2022: Grade 1 esophageal varices, portal hypertensive gastropathy, blood in the gastric body (posterior wall) treated with APC therapy, clip placed, duodenitis.  Advised pantoprazole 40 mg twice daily.   EGD December 2022: Stigmata of prior esophageal band ligation with no significant varices seen.  Slightly denuded distal esophageal mucosa.  Portal hypertensive gastropathy.  Polypoid gastric and duodenal mucosa with overlying erosions.  Marked mucosal friability   EGD 01/16/2021: Portal  hypertensive gastropathy, grade 2 esophageal varices with bleeding stigmata s/p banding x4, slight nodularity and congestion involving bulb and second portion of duodenum, nonspecific.  Repeat EGD in 4 weeks.   Colonoscopy 01/16/2021: Portal colopathy, large rectal varices, polypoid mass at splenic flexure biopsy,two 1 cm left colon polyps not removed, left-sided diverticulosis.  Reported patient had bleeding from the splenic flexure lesion.  Pathology with acute inflamed colonic mucosa with localized ulceration and prominent reactive changes.   Colonoscopy 01/19/2021: 15 mm polyp in proximal descending colon removed piecemeal using hot snare, 6 mm polyp in the ascending colon, benign polypoid lesion at splenic flexure which was likely source of bleeding given adherent clot and reported friability and excessive bleeding following previous biopsy, 12 mm polyp in sigmoid colon, diverticulosis in the sigmoid and descending colon, rectal varices.  Other polyps were present and not removed.  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 Recommended surveillance in 3 years.      Medications   Current Outpatient Medications  Medication Sig Dispense Refill   acetaminophen (TYLENOL) 650 MG CR tablet Take 650 mg by mouth every 8 (eight) hours as needed for pain.     Acidophilus Lactobacillus CAPS Take 1 tablet by mouth daily.     Alcohol Swabs (ALCOHOL PREP) 70 % PADS Apply 1 each topically 3 (three) times daily.     allopurinol (ZYLOPRIM) 300 MG tablet Take 300 mg by mouth daily.     ALPRAZolam (XANAX) 0.5 MG tablet Take 1 tablet (0.5 mg total) by mouth See admin instructions. Take one tablet (0.5 mg) by mouth  on Monday, Wednesday, Friday before dialysis, may also take one tablet (0.5 mg) daily as needed for anxiety. 5 tablet 0   Ascorbic Acid (VITAMIN C) 1000 MG tablet Take 1,000 mg by mouth in the morning.     BD PEN NEEDLE MICRO U/F 32G X 6 MM MISC  SMARTSIG:SUB-Q Daily PRN     calcium carbonate (TUMS - DOSED IN MG ELEMENTAL CALCIUM) 500 MG chewable tablet Chew 1 tablet by mouth 3 (three) times daily with meals.     cetirizine (ZYRTEC) 10 MG tablet Take 10 mg by mouth daily.     cholecalciferol (VITAMIN D3) 25 MCG (1000 UNIT) tablet Take 1,000 Units by mouth daily.     colchicine 0.6 MG tablet Take 0.6 mg by mouth 2 (two) times daily.     Continuous Blood Gluc Sensor (FREESTYLE LIBRE 2 SENSOR) MISC Apply topically.     diltiazem (TIAZAC) 120 MG 24 hr capsule Take 120 mg by mouth daily.     famotidine (PEPCID) 20 MG tablet Take 1 tablet (20 mg total) by mouth 2 (two) times daily. 60 tablet 1   furosemide (LASIX) 40 MG tablet Take 80 mg by mouth daily.     GLUCAGEN HYPOKIT 1 MG SOLR injection Inject 1 mg into the muscle once as needed for low blood sugar.     HUMALOG KWIKPEN 100 UNIT/ML KwikPen 10-50 Units 3 (three) times daily. Sliding scale     hydrOXYzine (ATARAX/VISTARIL) 25 MG tablet Take 25 mg by mouth 3 (three) times daily. For itching     iron polysaccharides (NIFEREX) 150 MG capsule Take 150 mg by mouth daily with supper.     lactulose (CHRONULAC) 10 GM/15ML solution Take 15 mLs (10 g total) by mouth 2 (two) times daily. 236 mL 0   metoprolol succinate (TOPROL-XL) 25 MG 24 hr tablet Take 1 tablet (25 mg total) by mouth daily. 30 tablet 1   mirtazapine (REMERON) 30 MG tablet Take 1 tablet (30 mg total) by mouth at bedtime. 30 tablet 3   Multiple Vitamin (MULTIVITAMIN WITH MINERALS) TABS tablet Take 1 tablet by mouth in the morning.      omega-3 acid ethyl esters (LOVAZA) 1 g capsule Take 2 g by mouth 2 (two) times daily.      ondansetron (ZOFRAN) 4 MG tablet Take 4 mg by mouth every 8 (eight) hours as needed for nausea or vomiting.     pantoprazole (PROTONIX) 40 MG tablet Take 1 tablet (40 mg total) by mouth 2 (two) times daily. 60 tablet 1   pravastatin (PRAVACHOL) 20 MG tablet Take 20 mg by mouth in the morning.      SSD 1 % cream  Apply 2 Applications topically 2 (two) times daily.     sulfamethoxazole-trimethoprim (BACTRIM DS) 800-160 MG tablet TAKE 1 TABLET BY MOUTH every friday AFTER dialysis 12 tablet 1   thiamine (VITAMIN B-1) 100 MG tablet Take 100 mg by mouth daily.     tiZANidine (ZANAFLEX) 2 MG tablet Take 2 mg by mouth 2 (two) times daily as needed.     TRESIBA FLEXTOUCH 200 UNIT/ML FlexTouch Pen Inject into the skin.     triamcinolone cream (KENALOG) 0.1 % Apply 1 application topically 2 (two) times daily as needed (dry skin Itching).  3   venlafaxine (EFFEXOR) 75 MG tablet Take 75 mg by mouth 3 (three) times daily with meals.      XIFAXAN 550 MG TABS tablet TAKE 1 TABLET BY MOUTH TWICE DAILY 60 tablet 11  No current facility-administered medications for this visit.    Allergies   Allergies as of 04/14/2022 - Review Complete 04/14/2022  Allergen Reaction Noted   Tape Itching, Rash, and Other (See Comments) 09/18/2019      Review of Systems   General: Negative for anorexia, weight loss, fever, chills, fatigue, weakness. ENT: Negative for hoarseness, difficulty swallowing , nasal congestion. CV: Negative for chest pain, angina, palpitations, dyspnea on exertion, peripheral edema.  Respiratory: Negative for dyspnea at rest, dyspnea on exertion, cough, sputum, wheezing.  GI: See history of present illness. GU:  Negative for dysuria, hematuria, urinary incontinence, urinary frequency, nocturnal urination.  Endo: Negative for unusual weight change.     Physical Exam   BP (!) 158/68 (BP Location: Right Arm, Patient Position: Sitting, Cuff Size: Normal)   Pulse 92   Temp 97.8 F (36.6 C) (Oral)   Ht '5\' 7"'$  (1.702 m)   Wt 174 lb (78.9 kg) Comment: pt reported  SpO2 96%   BMI 27.25 kg/m    General:chronically ill appearing male, in no acute distress. Accompanied by wife.  Eyes: No icterus. Mouth: Oropharyngeal mucosa moist and pink   Lungs: Clear to auscultation bilaterally diminished breath sounds  in the bases.   Heart: Regular rate and rhythm, no murmurs rubs or gallops.  Abdomen: Bowel sounds are normal, nontender, nondistended Rectal: not performed Extremities: 1-2+ pitting edema in both lower extremity, right foot slightly larger than left but left calf slightly larger than right. History of remote ankle fracture on the left. No leg pain. No clubbing or deformities. Neuro: Alert and oriented x 4. No asterixis. Skin: Warm and dry, no jaundice.   Psych: Alert and cooperative, normal mood and affect.  Labs   Lab Results  Component Value Date   CREATININE 2.89 (H) 02/20/2022   BUN 23 02/20/2022   NA 133 (L) 02/20/2022   K 3.5 02/20/2022   CL 99 02/20/2022   CO2 24 02/20/2022   Lab Results  Component Value Date   ALT 53 (H) 02/19/2022   AST 55 (H) 02/19/2022   ALKPHOS 381 (H) 02/19/2022   BILITOT 0.9 02/19/2022   Lab Results  Component Value Date   WBC 3.6 (L) 04/02/2022   HGB 7.2 (L) 04/02/2022   HCT 23.4 (L) 04/02/2022   MCV 103.5 (H) 04/02/2022   PLT 106 (L) 04/02/2022   Lab Results  Component Value Date   INR 1.2 02/19/2022   INR 1.2 08/10/2021   INR 1.3 (H) 04/14/2021   Lab Results  Component Value Date   IRON 57 12/30/2021   TIBC 285 12/30/2021   FERRITIN 182 12/30/2021   Lab Results  Component Value Date   VITAMINB12 991 (H) 12/30/2021   Lab Results  Component Value Date   FOLATE 36.1 01/13/2021    Imaging Studies   US Paracentesis  Result Date: 03/26/2022 INDICATION: Recurrent ascites; alcoholic cirrhosis EXAM: ULTRASOUND GUIDED LLQ PARACENTESIS MEDICATIONS: 10 cc 1% lidocaine COMPLICATIONS: None immediate. PROCEDURE: Informed written consent was obtained from the patient after a discussion of the risks, benefits and alternatives to treatment. A timeout was performed prior to the initiation of the procedure. Initial ultrasound scanning demonstrates a large amount of ascites within the left lower abdominal quadrant. The left lower abdomen was  prepped and draped in the usual sterile fashion. 1% lidocaine was used for local anesthesia. Following this, a Yueh catheter was introduced. An ultrasound image was saved for documentation purposes. The paracentesis was performed. The catheter was removed  and a dressing was applied. The patient tolerated the procedure well without immediate post procedural complication. Patient received post-procedure intravenous albumin; see nursing notes for details. FINDINGS: A total of approximately 3.2 liters of cloudy tan fluid was removed. Samples were sent to the laboratory as requested by the clinical team. IMPRESSION: Successful ultrasound-guided paracentesis yielding 3.2 liters of peritoneal fluid. PLAN: The patient has previously been evaluated by the [facility], and deemed not a candidate for intervention. Read by Lavonia Drafts [facility] Electronically Signed   By: Lavonia Dana M.D.   On: 03/26/2022 11:32    Assessment   Cirrhosis: decompensated as outlined. Due for hepatoma surveillance next month. Continue 2 gram sodium diet. Continue lactulose, Xifaxan, Bactrim as before. Unfortunately he was deemed not to be a good candidate for TIPs which was desired to slow GI bleeding due to portal HTN and recurrent ascites. Currently his ascites has required monthly taps more recently. He continues to have rectal bleeding in the setting of known rectal varices and polyps. He continues to be followed by hematology receiving IV iron.   PLAN   Monitor leg swelling. Prop up legs while sitting/laying. Two gram sodium restricted diet. Call if persistent swelling.  Follow up upcoming labs. Further recommendations to follow.  Ruq u/s in 05/2022.   Laureen Ochs. Bobby Rumpf, [place], [facility]

## 2022-04-13 NOTE — Telephone Encounter (Signed)
noted 

## 2022-04-14 ENCOUNTER — Encounter: Payer: Self-pay | Admitting: Urology

## 2022-04-14 ENCOUNTER — Ambulatory Visit (INDEPENDENT_AMBULATORY_CARE_PROVIDER_SITE_OTHER): Payer: Medicare Other | Admitting: Gastroenterology

## 2022-04-14 ENCOUNTER — Encounter: Payer: Self-pay | Admitting: Gastroenterology

## 2022-04-14 ENCOUNTER — Ambulatory Visit (INDEPENDENT_AMBULATORY_CARE_PROVIDER_SITE_OTHER): Payer: Medicare Other | Admitting: Urology

## 2022-04-14 VITALS — BP 129/64 | HR 91 | Temp 97.8°F | Ht 67.0 in | Wt 174.0 lb

## 2022-04-14 DIAGNOSIS — R339 Retention of urine, unspecified: Secondary | ICD-10-CM | POA: Diagnosis not present

## 2022-04-14 DIAGNOSIS — K625 Hemorrhage of anus and rectum: Secondary | ICD-10-CM

## 2022-04-14 DIAGNOSIS — K7031 Alcoholic cirrhosis of liver with ascites: Secondary | ICD-10-CM | POA: Diagnosis not present

## 2022-04-14 NOTE — Patient Instructions (Signed)
Please prop legs up when you are sitting and sleeping. Watch for persistent asymmetrical swelling. If persistent, then I would recommend ultrasound of legs to rule out blood clot.  Call with medication list.  I will follow up on upcoming labs. Further recommendations to follow.

## 2022-04-14 NOTE — Progress Notes (Signed)
Cath Change/ Replacement  Patient is present today for a catheter change due to urinary retention.  12m of water was removed from the balloon, a 16FR foley cath was removed without difficulty.  Patient was cleaned and prepped in a sterile fashion with betadine. A 16 FR foley cath was replaced into the bladder, no complications were noted. Urine return was noted 237mand urine was yellow in color. The balloon was filled with 1052mf sterile water. A leg bag was attached for drainage.  A night bag was also given to the patient and patient was given instruction on how to change from one bag to another. Patient was given proper instruction on catheter care.    Performed by: Hope LPN  Follow up: keep scheduled [place]

## 2022-04-15 ENCOUNTER — Ambulatory Visit: Payer: Medicare Other

## 2022-04-16 ENCOUNTER — Telehealth: Payer: Self-pay

## 2022-04-16 ENCOUNTER — Other Ambulatory Visit: Payer: Self-pay

## 2022-04-16 ENCOUNTER — Inpatient Hospital Stay: Payer: Medicare Other

## 2022-04-16 DIAGNOSIS — D500 Iron deficiency anemia secondary to blood loss (chronic): Secondary | ICD-10-CM

## 2022-04-16 DIAGNOSIS — D61818 Other pancytopenia: Secondary | ICD-10-CM

## 2022-04-16 DIAGNOSIS — M10349 Gout due to renal impairment, unspecified hand: Secondary | ICD-10-CM

## 2022-04-16 DIAGNOSIS — D649 Anemia, unspecified: Secondary | ICD-10-CM

## 2022-04-16 DIAGNOSIS — E538 Deficiency of other specified B group vitamins: Secondary | ICD-10-CM

## 2022-04-16 DIAGNOSIS — N186 End stage renal disease: Secondary | ICD-10-CM

## 2022-04-16 DIAGNOSIS — D509 Iron deficiency anemia, unspecified: Secondary | ICD-10-CM | POA: Diagnosis not present

## 2022-04-16 LAB — CBC WITH DIFFERENTIAL/PLATELET
Abs Immature Granulocytes: 0.01 10*3/uL (ref 0.00–0.07)
Basophils Absolute: 0 10*3/uL (ref 0.0–0.1)
Basophils Relative: 1 %
Eosinophils Absolute: 0.1 10*3/uL (ref 0.0–0.5)
Eosinophils Relative: 2 %
HCT: 24.4 % — ABNORMAL LOW (ref 39.0–52.0)
Hemoglobin: 7.6 g/dL — ABNORMAL LOW (ref 13.0–17.0)
Immature Granulocytes: 0 %
Lymphocytes Relative: 9 %
Lymphs Abs: 0.4 10*3/uL — ABNORMAL LOW (ref 0.7–4.0)
MCH: 33 pg (ref 26.0–34.0)
MCHC: 31.1 g/dL (ref 30.0–36.0)
MCV: 106.1 fL — ABNORMAL HIGH (ref 80.0–100.0)
Monocytes Absolute: 0.4 10*3/uL (ref 0.1–1.0)
Monocytes Relative: 10 %
Neutro Abs: 3.1 10*3/uL (ref 1.7–7.7)
Neutrophils Relative %: 78 %
Platelets: 127 10*3/uL — ABNORMAL LOW (ref 150–400)
RBC: 2.3 MIL/uL — ABNORMAL LOW (ref 4.22–5.81)
RDW: 21.1 % — ABNORMAL HIGH (ref 11.5–15.5)
WBC: 4 10*3/uL (ref 4.0–10.5)
nRBC: 0 % (ref 0.0–0.2)

## 2022-04-16 LAB — COMPREHENSIVE METABOLIC PANEL
ALT: 23 U/L (ref 0–44)
AST: 25 U/L (ref 15–41)
Albumin: 2.6 g/dL — ABNORMAL LOW (ref 3.5–5.0)
Alkaline Phosphatase: 208 U/L — ABNORMAL HIGH (ref 38–126)
Anion gap: 11 (ref 5–15)
BUN: 38 mg/dL — ABNORMAL HIGH (ref 8–23)
CO2: 27 mmol/L (ref 22–32)
Calcium: 8.3 mg/dL — ABNORMAL LOW (ref 8.9–10.3)
Chloride: 95 mmol/L — ABNORMAL LOW (ref 98–111)
Creatinine, Ser: 3.25 mg/dL — ABNORMAL HIGH (ref 0.61–1.24)
GFR, Estimated: 19 mL/min — ABNORMAL LOW (ref >60)
Glucose, Bld: 169 mg/dL — ABNORMAL HIGH (ref 70–99)
Potassium: 3.5 mmol/L (ref 3.5–5.1)
Sodium: 133 mmol/L — ABNORMAL LOW (ref 135–145)
Total Bilirubin: 0.5 mg/dL (ref 0.3–1.2)
Total Protein: 6.1 g/dL — ABNORMAL LOW (ref 6.5–8.1)

## 2022-04-16 LAB — IRON AND TIBC
Iron: 50 ug/dL (ref 45–182)
Saturation Ratios: 20 % (ref 17.9–39.5)
TIBC: 256 ug/dL (ref 250–450)
UIBC: 206 ug/dL

## 2022-04-16 LAB — SAMPLE TO BLOOD BANK

## 2022-04-16 LAB — FERRITIN: Ferritin: 313 ng/mL (ref 24–336)

## 2022-04-16 LAB — URIC ACID: Uric Acid, Serum: 2.1 mg/dL — ABNORMAL LOW (ref 3.7–8.6)

## 2022-04-16 NOTE — Telephone Encounter (Signed)
Pt's medication list has been updated in his chart.

## 2022-04-17 LAB — VITAMIN B12: Vitamin B-12: 992 pg/mL — ABNORMAL HIGH (ref 180–914)

## 2022-04-20 LAB — METHYLMALONIC ACID, SERUM: Methylmalonic Acid, Quantitative: 789 nmol/L — ABNORMAL HIGH (ref 0–378)

## 2022-04-20 NOTE — Progress Notes (Signed)
[address] [place], [place] [address]   CLINIC:  Medical Oncology/Hematology  PCP:  Monico Blitz, MD [place] [address] [phone]   REASON FOR VISIT:  Follow-up for iron deficiency anemia, anemia of ESRD   PRIOR THERAPY: Intermittent IV iron supplementation   CURRENT THERAPY: Epogen (at dialysis), IV iron supplementation, PRBC transfusions as needed, monthly B12 injections  INTERVAL HISTORY:  Mr. Jeffrey [name] 76 y.o. male returns for routine follow-up of his iron deficiency anemia, anemia of ESRD, thrombocytopenia, and leukopenia.  He was last seen by Tarri Abernethy PA-C on 01/08/2022.  At today's visit, he reports feeling tired due to not sleeping well last night. He was hospitalized twice since his last office visit. - Hospitalized (02/02/2022 to 02/07/2022) for acute metabolic encephalopathy and hepatic encephalopathy along with UTI and acute blood loss anemia.  Hgb down to 6.4 during hospitalization, transfused 2 units PRBC during hospitalization. - Hospitalized (02/19/2022 to 02/21/2022) with rectal bleeding and acute blood loss anemia.  Transfused 1 unit PRBC during this admission. - PRBC transfusion x 2 at [facility] since his last office visit, with PRBCs given on 03/19/2022 and 04/02/2022.  Patient continues to have melanotic stool with most bowel movements, with occasional rectal bleeding.  No epistaxis or hematuria. He continues to report easy bruising.  Patient's wife reports that he has been increasingly weak and fatigued, and has been needing wheelchair at home more frequently.  Continues to have baseline headaches and chronic dyspnea on exertion.  No chest pain, lightheadedness, or syncope.  He continues to have some weight fluctuations due to fluid overload from ESRD and cirrhosis, but overall is remaining stable within same baseline range.  No fever, chills, frequent infections.  He has 40% energy and 80% appetite. He endorses  that he is maintaining a stable weight.   REVIEW OF SYSTEMS:   Review of Systems  Constitutional:  Positive for appetite change and fatigue. Negative for chills, diaphoresis, fever and unexpected weight change.  HENT:   Negative for lump/mass and nosebleeds.   Eyes:  Negative for eye problems.  Respiratory:  Positive for shortness of breath (with exertion, on 3 L oxygen at home). Negative for cough and hemoptysis.   Cardiovascular:  Negative for chest pain, leg swelling and palpitations.  Gastrointestinal:  Negative for abdominal pain, blood in stool, constipation, diarrhea, nausea and vomiting.  Genitourinary:  Negative for hematuria.        Chronic catheter  Musculoskeletal:  Positive for arthralgias (osteoarthritis bilateral hands) and back pain.  Skin: Negative.   Neurological:  Negative for dizziness, headaches and light-headedness.  Hematological:  Bruises/bleeds easily.  Psychiatric/Behavioral:  Positive for depression. The patient is nervous/anxious.       PAST MEDICAL/SURGICAL HISTORY:  Past Medical History:  Diagnosis Date   A-fib [facility])    when initially stating dialysis january 2021 at [facility] per spouse; never heard anything else about it   Alcoholic cirrhosis [facility])    patient reports completing hep a and b vaccines in 2006   Anemia    has had 3 units of prbcs 2013   Anxiety    Arthritis    Asymptomatic gallstones    Ultrasound in 2006   B12 deficiency 11/21/2020   C. difficile colitis    Cholelithiasis    Chronic kidney disease    Dialysis M/W/F/Sa   Depression    Diabetes ([place])    Duodenal ulcer with hemorrhage    per patient in  2006 or 2007, records have been requested   Dyspnea    low oxygen level -  has O2 2 L all day   Esophageal varices (HCC)    see [facility]   GERD (gastroesophageal reflux disease)    Heart burn    History of alcohol abuse    quit 05/2013   HOH (hard of hearing)    Hypertension    Liver cirrhosis ([place])    ETOH   SBP  (spontaneous bacterial peritonitis) ([place])    2020, November 2021.    Splenomegaly    Ultrasound in 2006   Past Surgical History:  Procedure Laterality Date   AGILE CAPSULE N/A 09/20/2019   Procedure: AGILE CAPSULE;  Surgeon: Daneil Dolin, MD;  [facility];  Service: Endoscopy;  Laterality: N/A;   APPENDECTOMY     AV FISTULA PLACEMENT Left 08/17/2019   Procedure: ARTERIOVENOUS (AV) FISTULA CREATION VERSES ARTERIOVENOUS GRAFT;  Surgeon: [name], MD;  [facility]: [facility] OR;  Service: Vascular;  Laterality: Left;   BASCILIC VEIN TRANSPOSITION Left 09/28/2019   Procedure: LEFT SECOND STAGE [place];  Surgeon: [name], MD;  Location: [place];  Service: Vascular;  Laterality: Left;   BIOPSY  02/03/2018   Procedure: BIOPSY;  Surgeon: Daneil Dolin, MD;  [facility];  Service: Endoscopy;;  bx of gastric polyps   BIOPSY  09/19/2019   Procedure: BIOPSY;  Surgeon: Danie Binder, MD;  [facility];  Service: Endoscopy;;   BIOPSY  01/16/2021   Procedure: BIOPSY;  Surgeon: Daneil Dolin, MD;  [facility];  Service: Endoscopy;;   CATARACT EXTRACTION Right    COLONOSCOPY  03/10/2005   Rectal polyp as described above, removed with snare. Left sided  diverticula. The remainder of the colonic mucosa appeared normal. Inflammed polyp on path.   COLONOSCOPY  04/1999   Dr. Thea Silversmith polyps removed,  Path showed hyperplastic   COLONOSCOPY  10/2015   Dr. Britta Mccreedy: diverticulosis, single sessile tubular adenoma 3-88m in size removed from descending colon.    COLONOSCOPY WITH ESOPHAGOGASTRODUODENOSCOPY (EGD)  03/31/2012   REZM:[id number]. Colonic diverticulosis. tubular adenoma colon   COLONOSCOPY WITH ESOPHAGOGASTRODUODENOSCOPY (EGD)  06/2019   FORSYTH: small esophageal varices without high risk stigmata, single large AVM on lesser curvature in gastric body s/p APC ablation, gastric antral and duodenal bulb polyposis. No significant  source to explain transfusion dependent anemia. Colonoscopy with portal colopathy and diffuse edema, changes of Cdiff colitis on colonoscopy   COLONOSCOPY WITH PROPOFOL N/A 01/19/2021   Surgeon: CDaryel November MD; 15 mm polyp in descending colon removed piecemeal, 6 mm polyp in ascending colon and 12 mm polyp in sigmoid colon not removed due to high bleeding risk, benign polypoid lesion at splenic flexure, diverticulosis in sigmoid and descending colon, rectal varices.   COLONOSCOPY WITH PROPOFOL N/A 01/16/2021   Surgeon: RDaneil Dolin MD;  Portal colopathy, large rectal varices, polypoid mass at splenic flexure biopsy, two 1 cm left colon polyps not removed, left-sided diverticulosis.  Reported patient had bleeding from splenic flexure lesion.  Pathology with acutely inflamed colonic mucosa with localized ulceration and prominent reactive changes.   ESOPHAGEAL BANDING N/A 02/03/2018   Procedure: ESOPHAGEAL BANDING;  Surgeon: RDaneil Dolin MD;  [facility];  Service: Endoscopy;  Laterality: N/A;   ESOPHAGEAL BANDING  01/16/2021   Procedure: ESOPHAGEAL BANDING;  Surgeon: RDaneil Dolin MD;  [facility];  Service: Endoscopy;;  ESOPHAGOGASTRODUODENOSCOPY  01/15/2005   Three columns grade 1 to 2 esophageal varices, otherwise normal esophageal mucosa.  Esophagus was not manipulated otherwise./Nodularity of the antrum with overlying erosions, nonspecific finding. Path showed rare H.pylori   ESOPHAGOGASTRODUODENOSCOPY  09/2008   Dr. Gaylene Brooks columns of grade 2-3 esoph varices, only one column was prominent. Portal gastropathy, multiple gastrc polyps at antrum, two were 2cm with black eschar, bulbar polyps, bulbar erosions   ESOPHAGOGASTRODUODENOSCOPY  11/2004   Dr. Brantley Stage 3 esoph varices   ESOPHAGOGASTRODUODENOSCOPY  03/31/2012   RMR: 4 columns(3-GR 2, 1-GR1) non-bleeding esophageal varices, portal gastropathy, small HH, early GAVE, multiple gastric  polyps    ESOPHAGOGASTRODUODENOSCOPY (EGD) WITH PROPOFOL N/A 02/03/2018   Dr. Gala Romney: Esophageal varices, 3 columns grade 1-2.  Portal hypertensive gastropathy.  Multiple gastric polyps, biopsy consistent with hyperplastic.   ESOPHAGOGASTRODUODENOSCOPY (EGD) WITH PROPOFOL N/A 09/19/2019   Fields: grade I and II esophageal varcies, mild portal hypertensive gastropathy, moderate gastritis but no H. pylori, single hyperplastic gastric polyp removed, obvious source for melena/transfusion dependent anemia not identified, may be due to friable gastric and duodenal mucosa in the setting of portal hypertension   ESOPHAGOGASTRODUODENOSCOPY (EGD) WITH PROPOFOL N/A 01/16/2021   Surgeon: Daneil Dolin, MD; Portal hypertensive gastropathy, grade 2 esophageal varices with bleeding stigmata s/p banding x4, slight nodularity and congestion involving bulb and second portion of duodenum, nonspecific.  Recommended repeat EGD in 4 weeks.   ESOPHAGOGASTRODUODENOSCOPY (EGD) WITH PROPOFOL N/A 05/22/2021   stigmata of prior esophageal banding and no significant varices seen. Portal gastropathy. Polypoid gastric and duodenal mucosa with overlying erosions, marked mucosal friability.   ESOPHAGOGASTRODUODENOSCOPY (EGD) WITH PROPOFOL N/A 02/06/2022   Procedure: ESOPHAGOGASTRODUODENOSCOPY (EGD) WITH PROPOFOL;  Surgeon: Harvel Quale, MD;  [facility];  Service: Gastroenterology;  Laterality: N/A;   GASTRIC VARICES BANDING  03/31/2012   Procedure: GASTRIC VARICES BANDING;  Surgeon: Daneil Dolin, MD;  [facility];  Service: Endoscopy;  Laterality: N/A;   GIVENS CAPSULE STUDY N/A 09/21/2019   Poor study with a lot of debris obstructing much of the view of the first approximate 3 hours out of 6 hours.  No obvious source of bleeding identified.  Sequela of bleeding and old blood seen in the form of"whisps" of blood, blood flecks mostly toward the end of the study.   HEMOSTASIS CLIP PLACEMENT   01/19/2021   Procedure: HEMOSTASIS CLIP PLACEMENT;  Surgeon: [name], MD;  Location: [facility] ENDOSCOPY;  Service: Gastroenterology;;   HEMOSTASIS CLIP PLACEMENT  02/06/2022   Procedure: HEMOSTASIS CLIP PLACEMENT;  Surgeon: Montez Morita, Quillian Quince, MD;  [facility];  Service: Gastroenterology;;  gastric body   HOT HEMOSTASIS  02/06/2022   Procedure: HOT HEMOSTASIS (ARGON PLASMA COAGULATION/BICAP);  Surgeon: Montez Morita, Quillian Quince, MD;  [facility];  Service: Gastroenterology;;   IR REMOVAL TUN CV CATH W/O [facility]  02/15/2020   POLYPECTOMY  09/19/2019   Procedure: POLYPECTOMY;  Surgeon: Danie Binder, MD;  [facility];  Service: Endoscopy;;   POLYPECTOMY  01/19/2021   Procedure: POLYPECTOMY;  Surgeon: [name], MD;  Location: [place];  Service: Gastroenterology;;   UMBILICAL HERNIA REPAIR  2017   exploratory laparotomy, abdominal washout     SOCIAL HISTORY:  Social History   Socioeconomic History   Marital status: Married    Spouse name: Not on file   Number of children: 2   Years of education: Not on file   Highest education level: Not on file  Occupational History   Occupation: RETIRED    Employer: SELF EMPLOYED  Tobacco Use   Smoking status: Former    Packs/day: 2.00    Years: 25.00    Total pack years: 50.00    Types: Cigarettes    Quit date: 01/28/1986    Years since quitting: 36.2   Smokeless tobacco: Never  Vaping Use   Vaping Use: Never used  Substance and Sexual Activity   Alcohol use: No    Comment: quit in 05/2013   Drug use: No   Sexual activity: Not Currently  Other Topics Concern   Not on file  Social History Narrative   Two step children from second marriage (divorced) who live with him along with some grandchildren.    Social Determinants of Health   Financial Resource Strain: Not on file  Food Insecurity: No Food Insecurity (09/30/2021)   Hunger Vital Sign    Worried About Running Out of Food in  the Last Year: Never true    Ran Out of Food in the Last Year: Never true  Transportation Needs: No Transportation Needs (09/30/2021)   PRAPARE - Hydrologist (Medical): No    Lack of Transportation (Non-Medical): No  Physical Activity: Inactive (05/30/2020)   Exercise Vital Sign    Days of Exercise per Week: 0 days    Minutes of Exercise per Session: 0 min  Stress: Not on file  Social Connections: Not on file  Intimate Partner Violence: Not At Risk (05/30/2020)   Humiliation, Afraid, Rape, and Kick questionnaire    Fear of Current or Ex-Partner: No    Emotionally Abused: No    Physically Abused: No    Sexually Abused: No    FAMILY HISTORY:  Family History  Problem Relation Age of Onset   Colon cancer Father 24       deceased age 1   Breast cancer Sister        deceased   Diabetes Sister    Stroke Mother    Healthy Son    Healthy Daughter    Lung cancer Neg Hx    Ovarian cancer Neg Hx     CURRENT MEDICATIONS:  Outpatient Encounter Medications as of 04/21/2022  Medication Sig   acetaminophen (TYLENOL) 650 MG CR tablet Take 650 mg by mouth every 8 (eight) hours as needed for pain.   Acidophilus Lactobacillus CAPS Take 1 tablet by mouth daily.   Alcohol Swabs (ALCOHOL PREP) 70 % PADS Apply 1 each topically 3 (three) times daily.   allopurinol (ZYLOPRIM) 300 MG tablet Take 300 mg by mouth daily.   ALPRAZolam (XANAX) 0.5 MG tablet Take 1 tablet (0.5 mg total) by mouth See admin instructions. Take one tablet (0.5 mg) by mouth on Monday, Wednesday, Friday before dialysis, may also take one tablet (0.5 mg) daily as needed for anxiety.   Ascorbic Acid (VITAMIN C) 1000 MG tablet Take 1,000 mg by mouth in the morning.   BD PEN NEEDLE MICRO U/F 32G X 6 MM MISC SMARTSIG:SUB-Q Daily PRN   calcium carbonate (TUMS - DOSED IN MG ELEMENTAL CALCIUM) 500 MG chewable tablet Chew 1 tablet by mouth 3 (three) times daily with meals.   cetirizine (ZYRTEC) 10 MG tablet  Take 10 mg by mouth daily.   cholecalciferol (VITAMIN D3) 25 MCG (1000 UNIT) tablet Take 1,000 Units by mouth daily.   colchicine 0.6 MG tablet Take 0.6 mg by mouth 2 (two) times daily.   Continuous Blood Gluc Sensor (  FREESTYLE LIBRE 2 SENSOR) MISC Apply topically.   diltiazem (TIAZAC) 120 MG 24 hr capsule Take 120 mg by mouth daily.   famotidine (PEPCID) 20 MG tablet Take 1 tablet (20 mg total) by mouth 2 (two) times daily.   furosemide (LASIX) 40 MG tablet Take 80 mg by mouth daily.   GLUCAGEN HYPOKIT 1 MG SOLR injection Inject 1 mg into the muscle once as needed for low blood sugar.   HUMALOG KWIKPEN 100 UNIT/ML KwikPen 10-50 Units 3 (three) times daily. Sliding scale   hydrOXYzine (ATARAX/VISTARIL) 25 MG tablet Take 25 mg by mouth 3 (three) times daily. For itching   iron polysaccharides (NIFEREX) 150 MG capsule Take 150 mg by mouth daily with supper.   lactulose (CHRONULAC) 10 GM/15ML solution Take 15 mLs (10 g total) by mouth 2 (two) times daily.   metoprolol succinate (TOPROL-XL) 25 MG 24 hr tablet Take 1 tablet (25 mg total) by mouth daily.   mirtazapine (REMERON) 30 MG tablet Take 1 tablet (30 mg total) by mouth at bedtime.   Multiple Vitamin (MULTIVITAMIN WITH MINERALS) TABS tablet Take 1 tablet by mouth in the morning.    omega-3 acid ethyl esters (LOVAZA) 1 g capsule Take 2 g by mouth 2 (two) times daily.    ondansetron (ZOFRAN) 4 MG tablet Take 4 mg by mouth every 8 (eight) hours as needed for nausea or vomiting.   pantoprazole (PROTONIX) 40 MG tablet Take 1 tablet (40 mg total) by mouth 2 (two) times daily.   pravastatin (PRAVACHOL) 20 MG tablet Take 20 mg by mouth in the morning.    SSD 1 % cream Apply 2 Applications topically 2 (two) times daily.   sulfamethoxazole-trimethoprim (BACTRIM DS) 800-160 MG tablet TAKE 1 TABLET BY MOUTH every friday AFTER dialysis   thiamine (VITAMIN B-1) 100 MG tablet Take 100 mg by mouth daily.   tiZANidine (ZANAFLEX) 2 MG tablet Take 2 mg by mouth  2 (two) times daily as needed.   TRESIBA FLEXTOUCH 200 UNIT/ML FlexTouch Pen Inject into the skin.   triamcinolone cream (KENALOG) 0.1 % Apply 1 application topically 2 (two) times daily as needed (dry skin Itching).   venlafaxine (EFFEXOR) 75 MG tablet Take 75 mg by mouth 3 (three) times daily with meals.    XIFAXAN 550 MG TABS tablet TAKE 1 TABLET BY MOUTH TWICE DAILY   No facility-administered encounter medications on file as of 04/21/2022.    ALLERGIES:  Allergies  Allergen Reactions   Tape Itching, Rash and Other (See Comments)    Tears skin     PHYSICAL EXAM:    ECOG PERFORMANCE STATUS: 2 - Symptomatic, <50% confined to bed There were no vitals filed for this visit.  Physical Exam Constitutional:      Appearance: Normal appearance. He is obese.  HENT:     Head: Normocephalic and atraumatic.     Mouth/Throat:     Mouth: Mucous membranes are moist.  Eyes:     Extraocular Movements: Extraocular movements intact.     Pupils: Pupils are equal, round, and reactive to light.  Cardiovascular:     Rate and Rhythm: Normal rate and regular rhythm.     Pulses: Normal pulses.     Heart sounds: Murmur heard.  Pulmonary:     Effort: Pulmonary effort is normal.     Breath sounds: Decreased breath sounds, rhonchi (faint) and rales present.     Comments: Faint crackles and decreased air movement bilaterally Abdominal:     General: Bowel  sounds are normal. There is distension.     Palpations: Abdomen is soft.     Tenderness: There is no abdominal tenderness.  Musculoskeletal:        General: No swelling.     Right lower leg: Edema (2+ edema with dusky red hyperpigmentation of skin) present.     Left lower leg: Edema (2+ edema with dusky red hyperpigmentation of skin) present.  Lymphadenopathy:     Cervical: No cervical adenopathy.  Skin:    General: Skin is warm and dry.     Findings: Bruising (Scattered bruising on all 4 extremities) present.  Neurological:     General: No  focal deficit present.     Mental Status: He is alert and oriented to person, place, and time.  Psychiatric:        Mood and Affect: Mood normal.        Behavior: Behavior normal.    LABORATORY DATA:  I have reviewed the labs as listed.  CBC    Component Value Date/Time   WBC 4.0 04/16/2022 1318   RBC 2.30 (L) 04/16/2022 1318   HGB 7.6 (L) 04/16/2022 1318   HCT 24.4 (L) 04/16/2022 1318   HCT 28 02/25/2012 1034   PLT 127 (L) 04/16/2022 1318   PLT 125 02/25/2012 1034   MCV 106.1 (H) 04/16/2022 1318   MCV 82.0 02/25/2012 1034   MCH 33.0 04/16/2022 1318   MCHC 31.1 04/16/2022 1318   RDW 21.1 (H) 04/16/2022 1318   LYMPHSABS 0.4 (L) 04/16/2022 1318   MONOABS 0.4 04/16/2022 1318   EOSABS 0.1 04/16/2022 1318   BASOSABS 0.0 04/16/2022 1318      Latest Ref Rng & Units 04/16/2022    1:15 PM 02/20/2022    6:13 AM 02/19/2022    2:21 PM  CMP  Glucose 70 - 99 mg/dL 169  132  332   BUN 8 - 23 mg/dL 38  23  20   Creatinine 0.61 - 1.24 mg/dL 3.25  2.89  2.48   Sodium 135 - 145 mmol/L 133  133  130   Potassium 3.5 - 5.1 mmol/L 3.5  3.5  3.0   Chloride 98 - 111 mmol/L 95  99  94   CO2 22 - 32 mmol/L _0 Calcium 8.9 - 10.3 mg/dL 8.3  8.4  8.2   Total Protein 6.5 - 8.1 g/dL 6.1   6.5   Total Bilirubin 0.3 - 1.2 mg/dL 0.5   0.9   Alkaline Phos 38 - 126 U/L 208   381   AST 15 - 41 U/L 25   55   ALT 0 - 44 U/L 23   53     DIAGNOSTIC IMAGING:  I have independently reviewed the relevant imaging and discussed with the patient.   ASSESSMENT & PLAN: 1.  Pancytopenia of mixed etiology - Anemia secondary to ESRD, cirrhosis, and GI blood loss - Leukopenia and thrombocytopenia secondary to splenomegaly - CBC (04/16/2022): WBC 4.0, lymphocytes 0.4.  Platelets 127.  Hgb 7.6/MCV 106.1.  Ferritin 313, iron saturation 20%.  CMP reflects his baseline ESRD.  Elevated B12 992 with elevated MMA 789, likely falsely elevated in the setting of ESRD.  - MDS has not yet been ruled out via bone  marrow biopsy, as there would be no change in the patient's treatment plan regardless of results.  Due to his overall fragile state of health with poor prognosis, supportive care remains his best treatment option; he  would not be a candidate for any chemotherapeutic options.   - PLAN: Blood counts are stable at baseline.  We will defer bone marrow biopsy at this time.  Further plan as below.   2.  Macrocytic anemia, transfusion-dependent   - Anemia is related to his ESRD (receives Epogen at dialysis), end-stage liver disease/cirrhosis, and intermittent GI bleeding.  - Macrocytosis secondary to cirrhotic liver disease - Total of 5 PRBC transfusions in the past 3 months, given in both inpatient and outpatient settings - Intermittent melena with Hemoccult positive stool and extensive work-up for gastrointestinal blood loss: EGD (01/16/2021): Portal hypertensive gastropathy, grade 2 esophageal varices with bleeding stigmata s/p esophageal band ligation Colonoscopy (01/16/2021): Portal colopathy with large rectal varices, polypoid mass at splenic flexure thought to be source of bleeding. Repeat EGD (05/22/2021): Portal gastropathy with marked mucosal friability; polypoid gastric and duodenal mucosa with overlying erosions.  Stigmata of prior esophageal band ligation with no significant varices seen. EGD (02/06/2022): Grade 1 varices in esophagus.  Moderate portal hypertensive gastropathy.  Red blood on posterior wall of gastric body that is slowly oozing from gastric wall, treated with APC. - Work-up with SPEP and immunofixation was negative. Hemolysis labs were negative with normal LDH and reticulocyte count. - He has been receiving monthly Venofer 300 mg since May 2023, with noted decrease in the frequency of his blood transfusions - Receives Epogen at dialysis - Most recent labs (04/16/2022): Hgb 7.6/MCV 106.1.  Ferritin 313, iron saturation 20%.  CMP reflects his baseline ESRD.  Elevated B12 992 with  elevated MMA 789, likely falsely elevated in the setting of ESRD. - CBC today (04/21/2022): Hgb 6.7/MCV 105.9. - He reports melena with most bowel movements, intermittent rectal bleeding. - Symptomatic with worsening fatigue at baseline, and other chronic symptoms related in HPI/ROS above     - PLAN: Transfuse 1 unit PRBC today.  (Patient would benefit from 2 units, but due to concern for transfusion associated circulatory overload in the setting of ESRD and cirrhosis, we will plan on repeat CBC and transfusion again next week.) -- We will continue Venofer 300 mg monthly in the setting of chronic GI bleeds and functional iron deficiency. - Q2 weeks CBC/BB sample with PRBC transfusion for Hgb <7.0 - Continue GI follow-up for chronic bleeding. - RTC in 3 months with full lab panel before visit.   3.  Thrombocytopenia and leukopenia   -Thrombocytopenia and leukopenia due to are thought to be secondary to his chronic liver disease and splenomegaly. - Abdominal ultrasound (11/20/2021): Marked splenomegaly with volume of 1591 cc (18.6 cm length) - Most recent labs show WBC and platelets at baselin - Hospitalized for SBP in November 2022.  No other recent infections. - Patient denies frequent infections.  Reports easy bruising but denies significant bleeding events. - PLAN: Continue to monitor with repeat CBCs.   4.  B12 deficiency   - Anemia mildly macrocytic (in part due to liver disease) - Patient is receiving monthly B12 injections   - Note that methylmalonic acid may be falsely elevated in the setting of ESRD - PLAN: Continue monthly B12 injections.  Reassess in 6 months with repeat labs.   5.  End-stage renal disease:   -He gets dialysis Monday-Wednesday-Friday -He receives Epogen injections at his dialysis.   6. Cirrhosis / End-stage Liver Disease   - Receives paracentesis every 2 weeks - Follows regularly with gastroenterology -Was referred for possible TIPS procedure, was not felt to  be candidate   7.  Weight loss   - Patient had previously lost 25 pounds in 2 months, which he attributes to poor appetite and not eating well - Weight today is relatively stable compared to last visit.  He has some fluctuations related to fluid. - PLAN: Encouraged patient to drink Ensure at least once daily.  We will continue to follow weight at next visit.  If he continues to have unintentional weight loss would consider referral to dietitian and possible imaging studies if indicated.  8.  OTHER - Patient is established with palliative care. - Discussed CODE STATUS with patient's wife [name]) on 04/21/2022.  Patient is currently full code, but we did discuss his long-term prognosis and encouraged her to speak further with him regarding his wishes should he need resuscitation.   PLAN SUMMARY: >> PRBC x 1 today.  Repeat CBC with likely transfusion next week. >> CBC/BB sample with possible transfusion every 2 weeks >> Monthly Venofer 300 mg and B12 injection >> Labs in 3 months (CBC/D, CMP, ferritin, iron/TIBC, B12, MMA + BB sample) >> Office visit 1 week after labs in 3 months   All questions were answered. The patient knows to call the clinic with any problems, questions or concerns.  Medical decision making: Moderate   Time spent on visit: I spent 20 minutes counseling the patient face to face. The total time spent in the appointment was 30 minutes and more than 50% was on counseling.   Harriett Rush, PA-C  04/21/22 11:38 AM

## 2022-04-21 ENCOUNTER — Inpatient Hospital Stay: Payer: Medicare Other

## 2022-04-21 ENCOUNTER — Inpatient Hospital Stay (HOSPITAL_BASED_OUTPATIENT_CLINIC_OR_DEPARTMENT_OTHER): Payer: Medicare Other | Admitting: Physician Assistant

## 2022-04-21 ENCOUNTER — Other Ambulatory Visit: Payer: Self-pay

## 2022-04-21 ENCOUNTER — Ambulatory Visit: Payer: Medicare Other | Admitting: Internal Medicine

## 2022-04-21 VITALS — BP 145/81 | HR 64 | Temp 98.0°F | Resp 18 | Ht 67.0 in | Wt 181.0 lb

## 2022-04-21 DIAGNOSIS — E538 Deficiency of other specified B group vitamins: Secondary | ICD-10-CM

## 2022-04-21 DIAGNOSIS — N186 End stage renal disease: Secondary | ICD-10-CM

## 2022-04-21 DIAGNOSIS — D500 Iron deficiency anemia secondary to blood loss (chronic): Secondary | ICD-10-CM

## 2022-04-21 DIAGNOSIS — D649 Anemia, unspecified: Secondary | ICD-10-CM

## 2022-04-21 DIAGNOSIS — D61818 Other pancytopenia: Secondary | ICD-10-CM

## 2022-04-21 DIAGNOSIS — A419 Sepsis, unspecified organism: Secondary | ICD-10-CM | POA: Diagnosis not present

## 2022-04-21 DIAGNOSIS — E876 Hypokalemia: Secondary | ICD-10-CM | POA: Diagnosis not present

## 2022-04-21 LAB — CBC WITH DIFFERENTIAL/PLATELET
Abs Immature Granulocytes: 0.02 10*3/uL (ref 0.00–0.07)
Basophils Absolute: 0 10*3/uL (ref 0.0–0.1)
Basophils Relative: 1 %
Eosinophils Absolute: 0.1 10*3/uL (ref 0.0–0.5)
Eosinophils Relative: 3 %
HCT: 21.6 % — ABNORMAL LOW (ref 39.0–52.0)
Hemoglobin: 6.7 g/dL — CL (ref 13.0–17.0)
Immature Granulocytes: 1 %
Lymphocytes Relative: 8 %
Lymphs Abs: 0.3 10*3/uL — ABNORMAL LOW (ref 0.7–4.0)
MCH: 32.8 pg (ref 26.0–34.0)
MCHC: 31 g/dL (ref 30.0–36.0)
MCV: 105.9 fL — ABNORMAL HIGH (ref 80.0–100.0)
Monocytes Absolute: 0.3 10*3/uL (ref 0.1–1.0)
Monocytes Relative: 8 %
Neutro Abs: 2.8 10*3/uL (ref 1.7–7.7)
Neutrophils Relative %: 79 %
Platelets: 122 10*3/uL — ABNORMAL LOW (ref 150–400)
RBC: 2.04 MIL/uL — ABNORMAL LOW (ref 4.22–5.81)
RDW: 21.2 % — ABNORMAL HIGH (ref 11.5–15.5)
WBC: 3.5 10*3/uL — ABNORMAL LOW (ref 4.0–10.5)
nRBC: 0 % (ref 0.0–0.2)

## 2022-04-21 LAB — PREPARE RBC (CROSSMATCH)

## 2022-04-21 LAB — SAMPLE TO BLOOD BANK

## 2022-04-21 MED ORDER — LORATADINE 10 MG PO TABS
10.0000 mg | ORAL_TABLET | Freq: Once | ORAL | Status: AC
  Administered 2022-04-21: 10 mg via ORAL
  Filled 2022-04-21: qty 1

## 2022-04-21 MED ORDER — DIPHENHYDRAMINE HCL 25 MG PO CAPS: 25.0000 mg | ORAL_CAPSULE | Freq: Once | ORAL | Status: DC

## 2022-04-21 MED ORDER — SODIUM CHLORIDE 0.9 % IV SOLN
300.0000 mg | Freq: Once | INTRAVENOUS | Status: AC
  Administered 2022-04-21: 300 mg via INTRAVENOUS
  Filled 2022-04-21: qty 300

## 2022-04-21 MED ORDER — ACETAMINOPHEN 325 MG PO TABS
650.0000 mg | ORAL_TABLET | Freq: Once | ORAL | Status: AC
  Administered 2022-04-21: 650 mg via ORAL
  Filled 2022-04-21: qty 2

## 2022-04-21 MED ORDER — CYANOCOBALAMIN 1000 MCG/ML IJ SOLN
1000.0000 ug | Freq: Once | INTRAMUSCULAR | Status: AC
  Administered 2022-04-21: 1000 ug via INTRAMUSCULAR
  Filled 2022-04-21: qty 1

## 2022-04-21 MED ORDER — SODIUM CHLORIDE 0.9 % IV SOLN: Freq: Once | INTRAVENOUS | Status: AC

## 2022-04-21 MED ORDER — SODIUM CHLORIDE 0.9% IV SOLUTION
250.0000 mL | Freq: Once | INTRAVENOUS | Status: AC
  Administered 2022-04-21: 250 mL via INTRAVENOUS

## 2022-04-21 NOTE — Patient Instructions (Signed)
[facility] [place]  Discharge Instructions: Thank you for choosing [facility] to provide your oncology and hematology care.  If you have a lab appointment with the Cancer Center, please come in thru the Main Entrance and check in at the main information desk.  Wear comfortable clothing and clothing appropriate for easy access to any Portacath or PICC line.   We strive to give you quality time with your provider. You may need to reschedule your appointment if you arrive late (15 or more minutes).  Arriving late affects you and other patients whose appointments are after yours.  Also, if you miss three or more appointments without notifying the office, you may be dismissed from the clinic at the provider's discretion.      For prescription refill requests, have your pharmacy contact our office and allow 72 hours for refills to be completed.     To help prevent nausea and vomiting after your treatment, we encourage you to take your nausea medication as directed.  BELOW ARE SYMPTOMS THAT SHOULD BE REPORTED IMMEDIATELY: *FEVER GREATER THAN 100.4 F (38 C) OR HIGHER *CHILLS OR SWEATING *NAUSEA AND VOMITING THAT IS NOT CONTROLLED WITH YOUR NAUSEA MEDICATION *UNUSUAL SHORTNESS OF BREATH *UNUSUAL BRUISING OR BLEEDING *URINARY PROBLEMS (pain or burning when urinating, or frequent urination) *BOWEL PROBLEMS (unusual diarrhea, constipation, pain near the anus) TENDERNESS IN MOUTH AND THROAT WITH OR WITHOUT PRESENCE OF ULCERS (sore throat, sores in mouth, or a toothache) UNUSUAL RASH, SWELLING OR PAIN  UNUSUAL VAGINAL DISCHARGE OR ITCHING   Items with * indicate a potential emergency and should be followed up as soon as possible or go to the Emergency Department if any problems should occur.  Please show the CHEMOTHERAPY ALERT CARD or IMMUNOTHERAPY ALERT CARD at check-in to the Emergency Department and triage nurse.  Should you have questions after your visit or need to  cancel or reschedule your appointment, please contact [facility] [phone]  and follow the prompts.  Office hours are 8:00 a.m. to 4:30 p.m. Monday - Friday. Please note that voicemails left after 4:00 p.m. may not be returned until the following business day.  We are closed weekends and major holidays. You have access to a nurse at all times for urgent questions. Please call the main number to the clinic [phone] and follow the prompts.  For any non-urgent questions, you may also contact your provider using MyChart. We now offer e-Visits for anyone 18 and older to request care online for non-urgent symptoms. For details visit [email or web address].   Also download the MyChart app! Go to the app store, search "MyChart", open the app, select [facility], and log in with your MyChart username and password.  Masks are optional in the cancer centers. If you would like for your care team to wear a mask while they are taking care of you, please let them know. You may have one support person who is at least 75 years old accompany you for your appointments.  

## 2022-04-21 NOTE — Progress Notes (Signed)
CRITICAL VALUE STICKER  CRITICAL VALUE: hemoglobin 6.7  DATE & TIME NOTIFIED: 04/21/2022 @ 10:03 am  MESSENGER (representative from lab): Vinnie Level  MD NOTIFIED: Tarri Abernethy, PA-C  TIME OF NOTIFICATION: 10:04 am    RESPONSE: Patient is already scheduled to receive iron and possible blood today. He will get a blood transfusion today per Tarri Abernethy, PA-C.

## 2022-04-21 NOTE — Progress Notes (Signed)
Patient presents today for iron infusion and Vitamin B12 injection.  Patient is in satisfactory condition with no new complaints voiced.  Vital signs are stable.  We will proceed with infusion per provider orders.  Patient tolerated Vitamin B12 injection with no complaints voiced.  Site clean and dry with no bruising or swelling noted.  No complaints of pain.    Patient tolerated iron infusion and blood transfusion well with no complaints voiced.  Patient left via wheelchair with wife in stable condition.  Vital signs stable at discharge.  Follow up as scheduled.

## 2022-04-21 NOTE — Patient Instructions (Addendum)
[place] at [facility] Discharge Instructions  You were seen today by Tarri Abernethy PA-C for your follow-up visit.    We will give you 1 unit of blood today, but you will likely need another unit of blood next week. We will schedule you for labs and blood transfusion next Tuesday, 04/28/2022.  PLAN: - Every other week CBC and possible blood transfusion if needed - Monthly vitamin B12 injection - Monthly IV iron  **IMPORTANT INFORMATION: - Your blood transfusion places you at increased risk of fluid overload in your lungs.  If you notice any worsening breathing, shortness of breath, chest pain, or sleepiness, please go immediately to the emergency department. - You are also at risk of worsening anemia.  If you continue to have severe bleeding and notice any increasing weakness/fatigue, lightheadedness, passing out, chest pain, or hard time breathing, go immediately to the emergency department.  FOLLOW-UP APPOINTMENT: Repeat full lab panel in 3 months with office visit the following week  ** Thank you for trusting me with your healthcare!  I strive to provide all of my patients with quality care at each visit.  If you receive a survey for this visit, I would be so grateful to you for taking the time to provide feedback.  Thank you in advance!  ~ Judaea Burgoon                   Dr. Derek Jack   &   Tarri Abernethy, PA-C   - - - - - - - - - - - - - - - - - -     Thank you for choosing [place] at [facility] to provide your oncology and hematology care.  To afford each patient quality time with our provider, please arrive at least 15 minutes before your scheduled appointment time.   If you have a lab appointment with the [facility] please come in thru the Main Entrance and check in at the main information desk.  You need to re-schedule your appointment should you arrive 10 or more minutes late.  We strive to give you quality  time with our providers, and arriving late affects you and other patients whose appointments are after yours.  Also, if you no show three or more times for appointments you may be dismissed from the clinic at the providers discretion.     Again, thank you for choosing [facility].  Our hope is that these requests will decrease the amount of time that you wait before being seen by our physicians.       _____________________________________________________________  Should you have questions after your visit to [facility], please contact our office at [phone] and follow the prompts.  Our office hours are 8:00 a.m. and 4:30 p.m. Monday - Friday.  Please note that voicemails left after 4:00 p.m. may not be returned until the following business day.  We are closed weekends and major holidays.  You do have access to a nurse 24-7, just call the main number to the clinic [phone] and do not press any options, hold on the line and a nurse will answer the phone.    For prescription refill requests, have your pharmacy contact our office and allow 72 hours.    Due to Covid, you will need to wear a mask upon entering the hospital. If you do not have a mask, a mask will be given to you at  the Main Entrance upon arrival. For doctor visits, patients may have 1 support person age 3 or older with them. For treatment visits, patients can not have anyone with them due to social distancing guidelines and our immunocompromised population.

## 2022-04-22 ENCOUNTER — Emergency Department (HOSPITAL_COMMUNITY): Payer: Medicare Other

## 2022-04-22 ENCOUNTER — Inpatient Hospital Stay (HOSPITAL_COMMUNITY)
Admission: EM | Admit: 2022-04-22 | Discharge: 2022-05-02 | DRG: 698 | Disposition: A | Discharge status: Home Health | Payer: Medicare Other | Attending: Family Medicine | Admitting: Family Medicine

## 2022-04-22 DIAGNOSIS — Z9841 Cataract extraction status, right eye: Secondary | ICD-10-CM

## 2022-04-22 DIAGNOSIS — Z79899 Other long term (current) drug therapy: Secondary | ICD-10-CM | POA: Diagnosis not present

## 2022-04-22 DIAGNOSIS — J189 Pneumonia, unspecified organism: Secondary | ICD-10-CM | POA: Diagnosis present

## 2022-04-22 DIAGNOSIS — I509 Heart failure, unspecified: Secondary | ICD-10-CM | POA: Clinically undetermined

## 2022-04-22 DIAGNOSIS — I10 Essential (primary) hypertension: Secondary | ICD-10-CM | POA: Diagnosis not present

## 2022-04-22 DIAGNOSIS — D631 Anemia in chronic kidney disease: Secondary | ICD-10-CM | POA: Diagnosis present

## 2022-04-22 DIAGNOSIS — Z794 Long term (current) use of insulin: Secondary | ICD-10-CM | POA: Diagnosis not present

## 2022-04-22 DIAGNOSIS — Z1152 Encounter for screening for COVID-19: Secondary | ICD-10-CM

## 2022-04-22 DIAGNOSIS — K7030 Alcoholic cirrhosis of liver without ascites: Secondary | ICD-10-CM | POA: Diagnosis present

## 2022-04-22 DIAGNOSIS — Z992 Dependence on renal dialysis: Secondary | ICD-10-CM

## 2022-04-22 DIAGNOSIS — F32A Depression, unspecified: Secondary | ICD-10-CM | POA: Diagnosis present

## 2022-04-22 DIAGNOSIS — Y846 Urinary catheterization as the cause of abnormal reaction of the patient, or of later complication, without mention of misadventure at the time of the procedure: Secondary | ICD-10-CM | POA: Diagnosis not present

## 2022-04-22 DIAGNOSIS — T83518A Infection and inflammatory reaction due to other urinary catheter, initial encounter: Principal | ICD-10-CM | POA: Diagnosis present

## 2022-04-22 DIAGNOSIS — K219 Gastro-esophageal reflux disease without esophagitis: Secondary | ICD-10-CM | POA: Diagnosis present

## 2022-04-22 DIAGNOSIS — A419 Sepsis, unspecified organism: Secondary | ICD-10-CM | POA: Diagnosis present

## 2022-04-22 DIAGNOSIS — J9611 Chronic respiratory failure with hypoxia: Secondary | ICD-10-CM | POA: Diagnosis present

## 2022-04-22 DIAGNOSIS — R7881 Bacteremia: Secondary | ICD-10-CM | POA: Diagnosis present

## 2022-04-22 DIAGNOSIS — R6520 Severe sepsis without septic shock: Secondary | ICD-10-CM | POA: Diagnosis present

## 2022-04-22 DIAGNOSIS — M898X9 Other specified disorders of bone, unspecified site: Secondary | ICD-10-CM | POA: Diagnosis present

## 2022-04-22 DIAGNOSIS — Z833 Family history of diabetes mellitus: Secondary | ICD-10-CM

## 2022-04-22 DIAGNOSIS — K7682 Hepatic encephalopathy: Secondary | ICD-10-CM | POA: Diagnosis not present

## 2022-04-22 DIAGNOSIS — I132 Hypertensive heart and chronic kidney disease with heart failure and with stage 5 chronic kidney disease, or end stage renal disease: Secondary | ICD-10-CM | POA: Diagnosis present

## 2022-04-22 DIAGNOSIS — Z1612 Extended spectrum beta lactamase (ESBL) resistance: Secondary | ICD-10-CM | POA: Diagnosis present

## 2022-04-22 DIAGNOSIS — E876 Hypokalemia: Secondary | ICD-10-CM | POA: Diagnosis present

## 2022-04-22 DIAGNOSIS — D500 Iron deficiency anemia secondary to blood loss (chronic): Secondary | ICD-10-CM | POA: Diagnosis not present

## 2022-04-22 DIAGNOSIS — D61818 Other pancytopenia: Secondary | ICD-10-CM | POA: Diagnosis present

## 2022-04-22 DIAGNOSIS — Z9981 Dependence on supplemental oxygen: Secondary | ICD-10-CM

## 2022-04-22 DIAGNOSIS — B952 Enterococcus as the cause of diseases classified elsewhere: Secondary | ICD-10-CM | POA: Diagnosis present

## 2022-04-22 DIAGNOSIS — F419 Anxiety disorder, unspecified: Secondary | ICD-10-CM | POA: Diagnosis present

## 2022-04-22 DIAGNOSIS — D696 Thrombocytopenia, unspecified: Secondary | ICD-10-CM | POA: Diagnosis present

## 2022-04-22 DIAGNOSIS — N186 End stage renal disease: Secondary | ICD-10-CM

## 2022-04-22 DIAGNOSIS — B9620 Unspecified Escherichia coli [E. coli] as the cause of diseases classified elsewhere: Secondary | ICD-10-CM | POA: Diagnosis present

## 2022-04-22 DIAGNOSIS — D509 Iron deficiency anemia, unspecified: Secondary | ICD-10-CM | POA: Diagnosis present

## 2022-04-22 DIAGNOSIS — Z66 Do not resuscitate: Secondary | ICD-10-CM | POA: Diagnosis present

## 2022-04-22 DIAGNOSIS — K7031 Alcoholic cirrhosis of liver with ascites: Secondary | ICD-10-CM | POA: Diagnosis present

## 2022-04-22 DIAGNOSIS — N390 Urinary tract infection, site not specified: Secondary | ICD-10-CM | POA: Diagnosis present

## 2022-04-22 DIAGNOSIS — Z823 Family history of stroke: Secondary | ICD-10-CM

## 2022-04-22 DIAGNOSIS — E1122 Type 2 diabetes mellitus with diabetic chronic kidney disease: Secondary | ICD-10-CM

## 2022-04-22 DIAGNOSIS — Z91048 Other nonmedicinal substance allergy status: Secondary | ICD-10-CM

## 2022-04-22 DIAGNOSIS — B9689 Other specified bacterial agents as the cause of diseases classified elsewhere: Secondary | ICD-10-CM | POA: Diagnosis present

## 2022-04-22 DIAGNOSIS — Z87891 Personal history of nicotine dependence: Secondary | ICD-10-CM

## 2022-04-22 LAB — COMPREHENSIVE METABOLIC PANEL
ALT: 27 U/L (ref 0–44)
AST: 43 U/L — ABNORMAL HIGH (ref 15–41)
Albumin: 2.4 g/dL — ABNORMAL LOW (ref 3.5–5.0)
Alkaline Phosphatase: 254 U/L — ABNORMAL HIGH (ref 38–126)
Anion gap: 15 (ref 5–15)
BUN: 20 mg/dL (ref 8–23)
CO2: 25 mmol/L (ref 22–32)
Calcium: 8.1 mg/dL — ABNORMAL LOW (ref 8.9–10.3)
Chloride: 92 mmol/L — ABNORMAL LOW (ref 98–111)
Creatinine, Ser: 2.05 mg/dL — ABNORMAL HIGH (ref 0.61–1.24)
GFR, Estimated: 33 mL/min — ABNORMAL LOW (ref >60)
Glucose, Bld: 129 mg/dL — ABNORMAL HIGH (ref 70–99)
Potassium: 2.8 mmol/L — ABNORMAL LOW (ref 3.5–5.1)
Sodium: 132 mmol/L — ABNORMAL LOW (ref 135–145)
Total Bilirubin: 1.5 mg/dL — ABNORMAL HIGH (ref 0.3–1.2)
Total Protein: 6.3 g/dL — ABNORMAL LOW (ref 6.5–8.1)

## 2022-04-22 LAB — CBC WITH DIFFERENTIAL/PLATELET
Abs Immature Granulocytes: 0.01 10*3/uL (ref 0.00–0.07)
Basophils Absolute: 0 10*3/uL (ref 0.0–0.1)
Basophils Relative: 0 %
Eosinophils Absolute: 0 10*3/uL (ref 0.0–0.5)
Eosinophils Relative: 1 %
HCT: 25 % — ABNORMAL LOW (ref 39.0–52.0)
Hemoglobin: 8 g/dL — ABNORMAL LOW (ref 13.0–17.0)
Immature Granulocytes: 0 %
Lymphocytes Relative: 1 %
Lymphs Abs: 0 10*3/uL — ABNORMAL LOW (ref 0.7–4.0)
MCH: 32.4 pg (ref 26.0–34.0)
MCHC: 32 g/dL (ref 30.0–36.0)
MCV: 101.2 fL — ABNORMAL HIGH (ref 80.0–100.0)
Monocytes Absolute: 0 10*3/uL — ABNORMAL LOW (ref 0.1–1.0)
Monocytes Relative: 1 %
Neutro Abs: 2.5 10*3/uL (ref 1.7–7.7)
Neutrophils Relative %: 97 %
Platelets: 117 10*3/uL — ABNORMAL LOW (ref 150–400)
RBC: 2.47 MIL/uL — ABNORMAL LOW (ref 4.22–5.81)
RDW: 22.3 % — ABNORMAL HIGH (ref 11.5–15.5)
WBC: 2.6 10*3/uL — ABNORMAL LOW (ref 4.0–10.5)
nRBC: 1.2 % — ABNORMAL HIGH (ref 0.0–0.2)

## 2022-04-22 LAB — RESP PANEL BY RT-PCR (FLU A&B, COVID) ARPGX2
Influenza A by PCR: NEGATIVE
Influenza B by PCR: NEGATIVE
SARS Coronavirus 2 by RT PCR: NEGATIVE

## 2022-04-22 LAB — APTT: aPTT: 40 seconds — ABNORMAL HIGH (ref 24–36)

## 2022-04-22 LAB — LACTIC ACID, PLASMA
Lactic Acid, Venous: 4.6 mmol/L (ref 0.5–1.9)
Lactic Acid, Venous: 3.4 mmol/L (ref 0.5–1.9)

## 2022-04-22 LAB — PROTIME-INR
INR: 1.3 — ABNORMAL HIGH (ref 0.8–1.2)
Prothrombin Time: 16 seconds — ABNORMAL HIGH (ref 11.4–15.2)

## 2022-04-22 LAB — GROUP A STREP BY PCR: Group A Strep by PCR: NOT DETECTED

## 2022-04-22 MED ORDER — VENLAFAXINE HCL 37.5 MG PO TABS
75.0000 mg | ORAL_TABLET | Freq: Three times a day (TID) | ORAL | Status: DC
  Administered 2022-04-23 – 2022-05-02 (×26): 75 mg via ORAL
  Filled 2022-04-22 (×26): qty 2

## 2022-04-22 MED ORDER — ALBUTEROL SULFATE (2.5 MG/3ML) 0.083% IN NEBU: 2.5000 mg | INHALATION_SOLUTION | RESPIRATORY_TRACT | Status: DC | PRN

## 2022-04-22 MED ORDER — FAMOTIDINE 20 MG PO TABS
20.0000 mg | ORAL_TABLET | Freq: Two times a day (BID) | ORAL | Status: DC
  Administered 2022-04-23 – 2022-04-24 (×4): 20 mg via ORAL
  Filled 2022-04-22 (×4): qty 1

## 2022-04-22 MED ORDER — RIFAXIMIN 550 MG PO TABS
550.0000 mg | ORAL_TABLET | Freq: Two times a day (BID) | ORAL | Status: DC
  Administered 2022-04-23 – 2022-05-02 (×20): 550 mg via ORAL
  Filled 2022-04-22 (×21): qty 1

## 2022-04-22 MED ORDER — VANCOMYCIN HCL IN DEXTROSE 1-5 GM/200ML-% IV SOLN: 1000.0000 mg | INTRAVENOUS | Status: DC

## 2022-04-22 MED ORDER — ALPRAZOLAM 0.5 MG PO TABS
0.5000 mg | ORAL_TABLET | Freq: Every day | ORAL | Status: DC | PRN
  Administered 2022-04-29 (×2): 0.5 mg via ORAL
  Filled 2022-04-22 (×2): qty 1

## 2022-04-22 MED ORDER — OXYCODONE HCL 5 MG PO TABS
5.0000 mg | ORAL_TABLET | ORAL | Status: DC | PRN
  Administered 2022-04-29 – 2022-05-01 (×6): 5 mg via ORAL
  Filled 2022-04-22 (×6): qty 1

## 2022-04-22 MED ORDER — CHLORHEXIDINE GLUCONATE CLOTH 2 % EX PADS
6.0000 | MEDICATED_PAD | Freq: Every day | CUTANEOUS | Status: DC
  Administered 2022-04-23 – 2022-05-02 (×9): 6 via TOPICAL

## 2022-04-22 MED ORDER — ACETAMINOPHEN 325 MG PO TABS: 650.0000 mg | ORAL_TABLET | Freq: Four times a day (QID) | ORAL | Status: DC | PRN

## 2022-04-22 MED ORDER — SODIUM CHLORIDE 0.9 % IV SOLN
2.0000 g | Freq: Once | INTRAVENOUS | Status: AC
  Administered 2022-04-22: 2 g via INTRAVENOUS
  Filled 2022-04-22: qty 12.5

## 2022-04-22 MED ORDER — MIRTAZAPINE 15 MG PO TABS
30.0000 mg | ORAL_TABLET | Freq: Every day | ORAL | Status: DC
  Administered 2022-04-23 – 2022-05-01 (×10): 30 mg via ORAL
  Filled 2022-04-22 (×2): qty 2
  Filled 2022-04-22: qty 1
  Filled 2022-04-22: qty 2
  Filled 2022-04-22: qty 1
  Filled 2022-04-22: qty 2
  Filled 2022-04-22: qty 1
  Filled 2022-04-22 (×3): qty 2

## 2022-04-22 MED ORDER — HYDROXYZINE HCL 25 MG PO TABS
25.0000 mg | ORAL_TABLET | Freq: Three times a day (TID) | ORAL | Status: DC
  Administered 2022-04-23 – 2022-05-02 (×28): 25 mg via ORAL
  Filled 2022-04-22 (×28): qty 1

## 2022-04-22 MED ORDER — LACTATED RINGERS IV SOLN: INTRAVENOUS | Status: DC

## 2022-04-22 MED ORDER — POLYETHYLENE GLYCOL 3350 17 G PO PACK: 17.0000 g | PACK | Freq: Every day | ORAL | Status: DC | PRN

## 2022-04-22 MED ORDER — POTASSIUM CHLORIDE 20 MEQ PO PACK: 40.0000 meq | PACK | Freq: Once | ORAL | Status: DC

## 2022-04-22 MED ORDER — ACETAMINOPHEN 650 MG RE SUPP: 650.0000 mg | Freq: Four times a day (QID) | RECTAL | Status: DC | PRN

## 2022-04-22 MED ORDER — HEPARIN SODIUM (PORCINE) 5000 UNIT/ML IJ SOLN: 5000.0000 [IU] | Freq: Three times a day (TID) | INTRAMUSCULAR | Status: DC

## 2022-04-22 MED ORDER — SODIUM CHLORIDE 0.9 % IV BOLUS
2500.0000 mL | Freq: Once | INTRAVENOUS | Status: AC
  Administered 2022-04-22: 2500 mL via INTRAVENOUS

## 2022-04-22 MED ORDER — METOPROLOL SUCCINATE ER 25 MG PO TB24
25.0000 mg | ORAL_TABLET | Freq: Every day | ORAL | Status: DC
  Administered 2022-04-23 – 2022-05-02 (×9): 25 mg via ORAL
  Filled 2022-04-22 (×9): qty 1

## 2022-04-22 MED ORDER — ONDANSETRON HCL 4 MG/2ML IJ SOLN
4.0000 mg | Freq: Four times a day (QID) | INTRAMUSCULAR | Status: DC | PRN
  Administered 2022-04-29: 4 mg via INTRAVENOUS
  Filled 2022-04-22: qty 2

## 2022-04-22 MED ORDER — MAGNESIUM SULFATE 2 GM/50ML IV SOLN
2.0000 g | INTRAVENOUS | Status: AC
  Administered 2022-04-22: 2 g via INTRAVENOUS
  Filled 2022-04-22: qty 50

## 2022-04-22 MED ORDER — SODIUM CHLORIDE 0.9 % IV SOLN
1.0000 g | Freq: Once | INTRAVENOUS | Status: AC
  Administered 2022-04-22: 1 g via INTRAVENOUS
  Filled 2022-04-22: qty 10

## 2022-04-22 MED ORDER — LACTULOSE 10 GM/15ML PO SOLN
10.0000 g | Freq: Two times a day (BID) | ORAL | Status: DC
  Administered 2022-04-23 (×2): 10 g via ORAL
  Filled 2022-04-22 (×2): qty 30

## 2022-04-22 MED ORDER — PRAVASTATIN SODIUM 10 MG PO TABS
20.0000 mg | ORAL_TABLET | Freq: Every day | ORAL | Status: DC
  Administered 2022-04-23 – 2022-05-02 (×10): 20 mg via ORAL
  Filled 2022-04-22 (×10): qty 2

## 2022-04-22 MED ORDER — VANCOMYCIN HCL 1750 MG/350ML IV SOLN
1750.0000 mg | Freq: Once | INTRAVENOUS | Status: AC
  Administered 2022-04-23: 1750 mg via INTRAVENOUS
  Filled 2022-04-22: qty 350

## 2022-04-22 MED ORDER — POTASSIUM CHLORIDE 10 MEQ/100ML IV SOLN
10.0000 meq | Freq: Once | INTRAVENOUS | Status: AC
  Administered 2022-04-22: 10 meq via INTRAVENOUS
  Filled 2022-04-22: qty 100

## 2022-04-22 MED ORDER — PANTOPRAZOLE SODIUM 40 MG PO TBEC
40.0000 mg | DELAYED_RELEASE_TABLET | Freq: Two times a day (BID) | ORAL | Status: DC
  Administered 2022-04-23: 40 mg via ORAL
  Filled 2022-04-22: qty 1

## 2022-04-22 MED ORDER — SODIUM CHLORIDE 0.9 % IV SOLN
500.0000 mg | INTRAVENOUS | Status: DC
  Administered 2022-04-23: 500 mg via INTRAVENOUS
  Filled 2022-04-22: qty 5

## 2022-04-22 MED ORDER — SODIUM CHLORIDE 0.9 % IV SOLN: 2.0000 g | Freq: Two times a day (BID) | INTRAVENOUS | Status: DC

## 2022-04-22 MED ORDER — ONDANSETRON HCL 4 MG PO TABS: 4.0000 mg | ORAL_TABLET | Freq: Four times a day (QID) | ORAL | Status: DC | PRN

## 2022-04-22 NOTE — Progress Notes (Signed)
Pharmacy Antibiotic Note  Jeffrey [name] is a 76 y.o. male admitted on 04/22/2022 with pneumonia with fevers. Patient has known history of pancytopenia  Pharmacy has been consulted for vancomycin and cefepime dosing.  WBC 2.6/LA 3.4/Tmax 100.43F  Plan: Vancomycin '1750mg'$  IV x1 followed by vancomycin '1000mg'$  IV q24h (goal AUC 400-550; predicted AUC 491) Vanc levels PRN per protocol -  Cefepime 2g IV q12h F/u renal func, culture data, WBC    Height: '5\' 7"'$  (170.2 cm) Weight: 82.1 kg (181 lb) IBW/kg (Calculated) : 66.1  Temp (24hrs), Avg:100.6 F (38.1 C), Min:98.6 F (37 C), Max:103 F (39.4 C)  Recent Labs  Lab 04/16/22 1315 04/16/22 1318 04/21/22 0846 04/22/22 1940 04/22/22 2141  WBC  --  4.0 3.5* 2.6*  --   CREATININE 3.25*  --   --  2.05*  --   LATICACIDVEN  --   --   --  4.6* 3.4*    Estimated Creatinine Clearance: 31.9 mL/min (A) (by C-G formula based on SCr of 2.05 mg/dL (H)).    Allergies  Allergen Reactions   Tape Itching, Rash and Other (See Comments)    Tears skin    Antimicrobials this admission: Cefepime 11/22> Vanc 11/22>  Dose adjustments this admission:   Microbiology results: 11/22 Bcx: 11/22 MRSA PCR:  11/22 RVP:   Thank you for allowing pharmacy to be a part of this patient's care.  Wilson Singer, PharmD Clinical Pharmacist 04/22/2022 10:45 PM

## 2022-04-22 NOTE — ED Triage Notes (Signed)
Pt BIB [facility] from home with wife. Per wife, pt was running fever of 101 that was unresponsive to tylenol after 70mn so she called EMS. PT's temp currently 98.6 oral.

## 2022-04-22 NOTE — Sepsis Progress Note (Signed)
Following per sepsis protocol   

## 2022-04-22 NOTE — ED Provider Notes (Signed)
[facility] EMERGENCY DEPARTMENT Provider Note   CSN: [id number] Arrival date & time: 04/22/22  1854     History  No chief complaint on file.   Jeffrey [name] is a 76 y.o. male.  HPI   Patient is a 76 year old male, he has a history of pancytopenia, he also has a history of hypertension on diltiazem, he takes Lasix and metoprolol, he is also on a PPI as well as [place].  He presents to the hospital today with a complaint of a fever.  According to the patient his wife told him he had a fever of 101 degrees and he was brought to the hospital when the fever did not go away with a single dose of Tylenol  According to the medical record which I have reviewed the patient was seen yesterday by the oncology clinic, he was seen because of a history of iron deficiency anemia and anemia of end-stage renal disease  He has known thrombocytopenia and leukopenia, he was hospitalized in September for metabolic encephalopathy and hepatic encephalopathy along with a urinary tract infection, he was then hospitalized at the end of September with rectal bleeding and acute blood loss anemia requiring 1 unit of blood transfusion.  Evidently the patient was transfused 1 unit of packed red blood cells yesterday because of a hemoglobin of 6.7 and ongoing report of melena.  Review of prior procedure shows that the patient did have an upper endoscopy in September 2023.  Dr. Jenetta Downer did the upper endoscopy which showed that the patient had grade 1 varices portal hypertensive gastropathy, there was some blood found on the posterior wall of the gastric body and there was patchy inflammation and congestion in the duodenum  Home Medications Prior to Admission medications   Medication Sig Start Date End Date Taking? Authorizing Provider  acetaminophen (TYLENOL) 650 MG CR tablet Take 650 mg by mouth every 8 (eight) hours as needed for pain.    [provider]  Acidophilus Lactobacillus CAPS Take 1 tablet by  mouth daily.    [provider]  Alcohol Swabs (ALCOHOL PREP) 70 % PADS Apply 1 each topically 3 (three) times daily. 01/31/20   [provider]  allopurinol (ZYLOPRIM) 300 MG tablet Take 300 mg by mouth daily. 12/11/21   [provider]  ALPRAZolam Duanne Moron) 0.5 MG tablet Take 1 tablet (0.5 mg total) by mouth See admin instructions. Take one tablet (0.5 mg) by mouth on Monday, Wednesday, Friday before dialysis, may also take one tablet (0.5 mg) daily as needed for anxiety. 02/07/22   Orson Eva, MD  Ascorbic Acid (VITAMIN C) 1000 MG tablet Take 1,000 mg by mouth in the morning.    [provider]  BD PEN NEEDLE MICRO U/F 32G X 6 MM MISC SMARTSIG:SUB-Q Daily PRN 04/23/21   [provider]  calcium carbonate (TUMS - DOSED IN MG ELEMENTAL CALCIUM) 500 MG chewable tablet Chew 1 tablet by mouth 3 (three) times daily with meals.    [provider]  cetirizine (ZYRTEC) 10 MG tablet Take 10 mg by mouth daily. 06/24/21   [provider]  cholecalciferol (VITAMIN D3) 25 MCG (1000 UNIT) tablet Take 1,000 Units by mouth daily.    [provider]  colchicine 0.6 MG tablet Take 0.6 mg by mouth 2 (two) times daily. 02/26/22   [provider]  Continuous Blood Gluc Sensor (FREESTYLE LIBRE 2 SENSOR) MISC Apply topically. 10/15/21   [provider]  diltiazem (TIAZAC) 120 MG 24 hr capsule  Take 120 mg by mouth daily. 02/17/22   [provider]  famotidine (PEPCID) 20 MG tablet Take 1 tablet (20 mg total) by mouth 2 (two) times daily. 07/30/21 07/30/22  Barton Dubois, MD  furosemide (LASIX) 40 MG tablet Take 80 mg by mouth daily. 12/29/19   [provider]  GLUCAGEN HYPOKIT 1 MG SOLR injection Inject 1 mg into the muscle once as needed for low blood sugar. 11/28/20   [provider]  HUMALOG KWIKPEN 100 UNIT/ML KwikPen 10-50 Units 3 (three) times daily. Sliding scale 04/23/21   [provider]  hydrOXYzine  (ATARAX/VISTARIL) 25 MG tablet Take 25 mg by mouth 3 (three) times daily. For itching 11/23/20   [provider]  iron polysaccharides (NIFEREX) 150 MG capsule Take 150 mg by mouth daily with supper.    [provider]  lactulose (CHRONULAC) 10 GM/15ML solution Take 15 mLs (10 g total) by mouth 2 (two) times daily. 02/07/22   Orson Eva, MD  metoprolol succinate (TOPROL-XL) 25 MG 24 hr tablet Take 1 tablet (25 mg total) by mouth daily. 02/22/22   Orson Eva, MD  mirtazapine (REMERON) 30 MG tablet Take 1 tablet (30 mg total) by mouth at bedtime. 02/10/20   Roxan Hockey, MD  Multiple Vitamin (MULTIVITAMIN WITH MINERALS) TABS tablet Take 1 tablet by mouth in the morning.     [provider]  omega-3 acid ethyl esters (LOVAZA) 1 g capsule Take 2 g by mouth 2 (two) times daily.  04/07/19   [provider]  ondansetron (ZOFRAN) 4 MG tablet Take 4 mg by mouth every 8 (eight) hours as needed for nausea or vomiting.    [provider]  pantoprazole (PROTONIX) 40 MG tablet Take 1 tablet (40 mg total) by mouth 2 (two) times daily. 02/21/22   Orson Eva, MD  pravastatin (PRAVACHOL) 20 MG tablet Take 20 mg by mouth in the morning.     [provider]  SSD 1 % cream Apply 2 Applications topically 2 (two) times daily. 02/26/22   [provider]  sulfamethoxazole-trimethoprim (BACTRIM DS) 800-160 MG tablet TAKE 1 TABLET BY MOUTH every friday AFTER dialysis 03/12/22   Annitta Needs, NP  thiamine (VITAMIN B-1) 100 MG tablet Take 100 mg by mouth daily. 08/27/20   [provider]  tiZANidine (ZANAFLEX) 2 MG tablet Take 2 mg by mouth 2 (two) times daily as needed. 04/07/22   [provider]  TRESIBA FLEXTOUCH 200 UNIT/ML FlexTouch Pen Inject into the skin. 02/17/22   [provider]  triamcinolone cream (KENALOG) 0.1 % Apply 1 application topically 2 (two) times daily as needed (dry skin Itching). 12/30/17   [provider]   venlafaxine (EFFEXOR) 75 MG tablet Take 75 mg by mouth 3 (three) times daily with meals.     [provider]  XIFAXAN 550 MG TABS tablet TAKE 1 TABLET BY MOUTH TWICE DAILY 02/12/22   Annitta Needs, NP      Allergies    Tape    Review of Systems   Review of Systems  Physical Exam Updated Vital Signs BP (!) 116/54   Pulse 90   Temp (!) 103 F (39.4 C) (Rectal)   Resp (!) 27   Ht 1.702 m ('5\' 7"'$ )   Wt 82.1 kg   SpO2 97%   BMI 28.35 kg/m  Physical Exam  ED Results / Procedures / Treatments   Labs (all labs ordered are listed, but only abnormal results are displayed) Labs  Reviewed  LACTIC ACID, PLASMA - Abnormal; Notable for the following components:      Result Value   Lactic Acid, Venous 4.6 (*)    All other components within normal limits  COMPREHENSIVE METABOLIC PANEL - Abnormal; Notable for the following components:   Sodium 132 (*)    Potassium 2.8 (*)    Chloride 92 (*)    Glucose, Bld 129 (*)    Creatinine, Ser 2.05 (*)    Calcium 8.1 (*)    Total Protein 6.3 (*)    Albumin 2.4 (*)    AST 43 (*)    Alkaline Phosphatase 254 (*)    Total Bilirubin 1.5 (*)    GFR, Estimated 33 (*)    All other components within normal limits  CBC WITH DIFFERENTIAL/PLATELET - Abnormal; Notable for the following components:   WBC 2.6 (*)    RBC 2.47 (*)    Hemoglobin 8.0 (*)    HCT 25.0 (*)    MCV 101.2 (*)    RDW 22.3 (*)    Platelets 117 (*)    nRBC 1.2 (*)    Lymphs Abs 0.0 (*)    Monocytes Absolute 0.0 (*)    All other components within normal limits  PROTIME-INR - Abnormal; Notable for the following components:   Prothrombin Time 16.0 (*)    INR 1.3 (*)    All other components within normal limits  APTT - Abnormal; Notable for the following components:   aPTT 40 (*)    All other components within normal limits  CULTURE, BLOOD (ROUTINE X 2)  CULTURE, BLOOD (ROUTINE X 2)  RESP PANEL BY RT-PCR (FLU A&B, COVID) ARPGX2  URINE CULTURE  GROUP A STREP BY PCR   LACTIC ACID, PLASMA  URINALYSIS, ROUTINE W REFLEX MICROSCOPIC    EKG None  Radiology DG Chest Port 1 View  Result Date: 04/22/2022 CLINICAL DATA:  Question of sepsis.  Fever. EXAM: PORTABLE CHEST 1 VIEW COMPARISON:  02/02/2022 FINDINGS: Cardiac enlargement. Small bilateral pleural effusions. Focal opacity in the right mid lung likely representing consolidation and suggesting pneumonia. Possible infiltration or atelectasis in the left lower lung behind the heart. No pneumothorax. Mediastinal contours appear intact. Calcification of the aorta. Degenerative changes in the spine and shoulders. IMPRESSION: 1. Focal consolidation in the right middle lung, likely pneumonia. Possible infiltration or atelectasis in the left lung base behind the heart. 2. Small bilateral pleural effusions. Electronically Signed   By: Lucienne Capers M.D.   On: 04/22/2022 19:42    Procedures .Critical Care  Performed by: Noemi Chapel, MD Authorized by: Noemi Chapel, MD   Critical care provider statement:    Critical care time (minutes):  30   Critical care time was exclusive of:  Teaching time and separately billable procedures and treating other patients   Critical care was necessary to treat or prevent imminent or life-threatening deterioration of the following conditions:  Respiratory failure and sepsis   Critical care was time spent personally by me on the following activities:  Development of treatment plan with patient or surrogate, discussions with consultants, evaluation of patient's response to treatment, examination of patient, ordering and review of laboratory studies, ordering and review of radiographic studies, ordering and performing treatments and interventions, pulse oximetry, re-evaluation of patient's condition, review of old charts and obtaining history from patient or surrogate   I assumed direction of critical care for this patient from another provider in my specialty: no     Care discussed  with:  admitting provider   Comments:           Medications Ordered in ED Medications  lactated ringers infusion ( Intravenous New Bag/Given 04/22/22 2049)  ceFEPIme (MAXIPIME) 2 g in sodium chloride 0.9 % 100 mL IVPB (has no administration in time range)  sodium chloride 0.9 % bolus 2,500 mL (has no administration in time range)  magnesium sulfate IVPB 2 g 50 mL (has no administration in time range)  potassium chloride 10 mEq in 100 mL IVPB (has no administration in time range)  cefTRIAXone (ROCEPHIN) 1 g in sodium chloride 0.9 % 100 mL IVPB (1 g Intravenous New Bag/Given 04/22/22 2017)    ED Course/ Medical Decision Making/ A&P                           Medical Decision Making Amount and/or Complexity of Data Reviewed Labs: ordered. Radiology: ordered. ECG/medicine tests: ordered.  Risk Prescription drug management. Decision regarding hospitalization.   This patient presents to the ED for concern of neutropenic fever, sepsis, pancytopenia, this involves an extensive number of treatment options, and is a complaint that carries with it a high risk of complications and morbidity.  The differential diagnosis includes strep throat, COVID, pneumonia, urinary tract infection, bacteremia   Co morbidities that complicate the patient evaluation  Pancytopenia   Additional history obtained:  Additional history obtained from electronic medical record External records from outside source obtained and reviewed including as above the patient received a blood transfusion yesterday in the hematology oncology clinic   Lab Tests:  I Ordered, and personally interpreted labs.  The pertinent results include: Lactic acid greater than 4, white blood cell count is low at 2.6, the patient has hypokalemia of 2.8 but renal function appears better than it has in the past   Imaging Studies ordered:  I ordered imaging studies including chest x-ray I independently visualized and interpreted  imaging which showed pneumonia I agree with the radiologist interpretation   Cardiac Monitoring: / EKG:  The patient was maintained on a cardiac monitor.  I personally viewed and interpreted the cardiac monitored which showed an underlying rhythm of: Sinus tachycardia gradually improving with interventions of IV fluids and antibiotics   Consultations Obtained:  I requested consultation with the hospitalist,  and discussed lab and imaging findings as well as pertinent plan - they recommend: Admit   Problem List / ED Course / Critical interventions / Medication management  This patient is critically ill with what appears to be significant febrile illness, likely sepsis and possibly septic shock given a lactate greater than 4, that being said he is not hypotensive, he has been given 30 cc/kg of IV fluids, antibiotics and magnesium and potassium replacement.  We will consult with hospitalist for admission will need high level of care, critically ill Reevaluation of the patient after these medicines showed that the patient critically ill I have reviewed the patients home medicines and have made adjustments as needed   Social Determinants of Health:  Pancytopenic   Test / Admission - Considered:  Admit to higher level of care         Final Clinical Impression(s) / ED Diagnoses Final diagnoses:  Sepsis, due to unspecified organism, unspecified whether acute organ dysfunction present ([place])  Pancytopenia ([place])  Pneumonia of right middle lobe due to infectious organism  Hypokalemia    Rx / DC Orders ED Discharge Orders     None  Noemi Chapel, MD 04/22/22 [id number]

## 2022-04-23 ENCOUNTER — Encounter (HOSPITAL_COMMUNITY): Payer: Self-pay | Admitting: Family Medicine

## 2022-04-23 ENCOUNTER — Other Ambulatory Visit: Payer: Self-pay

## 2022-04-23 DIAGNOSIS — K7682 Hepatic encephalopathy: Secondary | ICD-10-CM

## 2022-04-23 DIAGNOSIS — E876 Hypokalemia: Secondary | ICD-10-CM

## 2022-04-23 DIAGNOSIS — J189 Pneumonia, unspecified organism: Secondary | ICD-10-CM

## 2022-04-23 DIAGNOSIS — Z992 Dependence on renal dialysis: Secondary | ICD-10-CM

## 2022-04-23 DIAGNOSIS — D500 Iron deficiency anemia secondary to blood loss (chronic): Secondary | ICD-10-CM | POA: Diagnosis not present

## 2022-04-23 DIAGNOSIS — J9611 Chronic respiratory failure with hypoxia: Secondary | ICD-10-CM

## 2022-04-23 DIAGNOSIS — R7881 Bacteremia: Secondary | ICD-10-CM

## 2022-04-23 DIAGNOSIS — K7030 Alcoholic cirrhosis of liver without ascites: Secondary | ICD-10-CM

## 2022-04-23 DIAGNOSIS — D631 Anemia in chronic kidney disease: Secondary | ICD-10-CM

## 2022-04-23 DIAGNOSIS — A419 Sepsis, unspecified organism: Secondary | ICD-10-CM

## 2022-04-23 DIAGNOSIS — I10 Essential (primary) hypertension: Secondary | ICD-10-CM

## 2022-04-23 DIAGNOSIS — N186 End stage renal disease: Secondary | ICD-10-CM

## 2022-04-23 DIAGNOSIS — Z794 Long term (current) use of insulin: Secondary | ICD-10-CM

## 2022-04-23 DIAGNOSIS — E1122 Type 2 diabetes mellitus with diabetic chronic kidney disease: Secondary | ICD-10-CM

## 2022-04-23 DIAGNOSIS — K219 Gastro-esophageal reflux disease without esophagitis: Secondary | ICD-10-CM

## 2022-04-23 LAB — BPAM RBC
Blood Product Expiration Date: 202312232359
Blood Product Expiration Date: 202312232359
Blood Product Expiration Date: 202312242359
ISSUE DATE / TIME: 202311211248
Unit Type and Rh: 5100
Unit Type and Rh: 5100
Unit Type and Rh: 5100

## 2022-04-23 LAB — BLOOD CULTURE ID PANEL (REFLEXED) - BCID2

## 2022-04-23 LAB — COMPREHENSIVE METABOLIC PANEL
ALT: 26 U/L (ref 0–44)
AST: 52 U/L — ABNORMAL HIGH (ref 15–41)
Albumin: 2 g/dL — ABNORMAL LOW (ref 3.5–5.0)
Alkaline Phosphatase: 235 U/L — ABNORMAL HIGH (ref 38–126)
Anion gap: 11 (ref 5–15)
BUN: 23 mg/dL (ref 8–23)
CO2: 23 mmol/L (ref 22–32)
Calcium: 7.4 mg/dL — ABNORMAL LOW (ref 8.9–10.3)
Chloride: 98 mmol/L (ref 98–111)
Creatinine, Ser: 2.23 mg/dL — ABNORMAL HIGH (ref 0.61–1.24)
GFR, Estimated: 30 mL/min — ABNORMAL LOW (ref >60)
Glucose, Bld: 114 mg/dL — ABNORMAL HIGH (ref 70–99)
Potassium: 3 mmol/L — ABNORMAL LOW (ref 3.5–5.1)
Sodium: 132 mmol/L — ABNORMAL LOW (ref 135–145)
Total Bilirubin: 1 mg/dL (ref 0.3–1.2)
Total Protein: 5 g/dL — ABNORMAL LOW (ref 6.5–8.1)

## 2022-04-23 LAB — HEPATITIS B SURFACE ANTIGEN: Hepatitis B Surface Ag: NONREACTIVE

## 2022-04-23 LAB — CBC WITH DIFFERENTIAL/PLATELET
Abs Immature Granulocytes: 0.07 10*3/uL (ref 0.00–0.07)
Basophils Absolute: 0 10*3/uL (ref 0.0–0.1)
Basophils Relative: 0 %
Eosinophils Absolute: 0 10*3/uL (ref 0.0–0.5)
Eosinophils Relative: 0 %
HCT: 22.2 % — ABNORMAL LOW (ref 39.0–52.0)
Hemoglobin: 6.8 g/dL — CL (ref 13.0–17.0)
Immature Granulocytes: 1 %
Lymphocytes Relative: 2 %
Lymphs Abs: 0.1 10*3/uL — ABNORMAL LOW (ref 0.7–4.0)
MCH: 32.1 pg (ref 26.0–34.0)
MCHC: 30.6 g/dL (ref 30.0–36.0)
MCV: 104.7 fL — ABNORMAL HIGH (ref 80.0–100.0)
Monocytes Absolute: 0.3 10*3/uL (ref 0.1–1.0)
Monocytes Relative: 4 %
Neutro Abs: 6.3 10*3/uL (ref 1.7–7.7)
Neutrophils Relative %: 93 %
Platelets: 79 10*3/uL — ABNORMAL LOW (ref 150–400)
RBC: 2.12 MIL/uL — ABNORMAL LOW (ref 4.22–5.81)
RDW: 22.5 % — ABNORMAL HIGH (ref 11.5–15.5)
WBC: 6.8 10*3/uL (ref 4.0–10.5)
nRBC: 0 % (ref 0.0–0.2)

## 2022-04-23 LAB — URINALYSIS, ROUTINE W REFLEX MICROSCOPIC
Bilirubin Urine: NEGATIVE
Glucose, UA: NEGATIVE mg/dL
Ketones, ur: NEGATIVE mg/dL
Nitrite: NEGATIVE
Protein, ur: >=300 mg/dL — AB
Specific Gravity, Urine: 1.017 (ref 1.005–1.030)
WBC, UA: >50 WBC/hpf — ABNORMAL HIGH (ref 0–5)
pH: 5 (ref 5.0–8.0)

## 2022-04-23 LAB — LACTIC ACID, PLASMA
Lactic Acid, Venous: 1.8 mmol/L (ref 0.5–1.9)
Lactic Acid, Venous: 2 mmol/L (ref 0.5–1.9)
Lactic Acid, Venous: 3 mmol/L (ref 0.5–1.9)

## 2022-04-23 LAB — BLOOD GAS, VENOUS
Acid-Base Excess: 1.3 mmol/L (ref 0.0–2.0)
Bicarbonate: 26.6 mmol/L (ref 20.0–28.0)
Drawn by: 53361
O2 Saturation: 92.6 %
Patient temperature: 37.1
pCO2, Ven: 45 mmHg (ref 44–60)
pH, Ven: 7.38 (ref 7.25–7.43)
pO2, Ven: 65 mmHg — ABNORMAL HIGH (ref 32–45)

## 2022-04-23 LAB — TYPE AND SCREEN
ABO/RH(D): O POS
Antibody Screen: NEGATIVE
Unit division: 0
Unit division: 0
Unit division: 0

## 2022-04-23 LAB — CORTISOL-AM, BLOOD: Cortisol - AM: 30.9 ug/dL — ABNORMAL HIGH (ref 6.7–22.6)

## 2022-04-23 LAB — GLUCOSE, CAPILLARY
Glucose-Capillary: 135 mg/dL — ABNORMAL HIGH (ref 70–99)
Glucose-Capillary: 181 mg/dL — ABNORMAL HIGH (ref 70–99)
Glucose-Capillary: 190 mg/dL — ABNORMAL HIGH (ref 70–99)
Glucose-Capillary: 222 mg/dL — ABNORMAL HIGH (ref 70–99)

## 2022-04-23 LAB — AMMONIA: Ammonia: 78 umol/L — ABNORMAL HIGH (ref 9–35)

## 2022-04-23 LAB — MRSA NEXT GEN BY PCR, NASAL: MRSA by PCR Next Gen: NOT DETECTED

## 2022-04-23 LAB — PREPARE RBC (CROSSMATCH)

## 2022-04-23 LAB — HEMOGLOBIN AND HEMATOCRIT, BLOOD
HCT: 29.8 % — ABNORMAL LOW (ref 39.0–52.0)
Hemoglobin: 9.4 g/dL — ABNORMAL LOW (ref 13.0–17.0)

## 2022-04-23 LAB — PROTIME-INR
INR: 1.5 — ABNORMAL HIGH (ref 0.8–1.2)
Prothrombin Time: 18.2 seconds — ABNORMAL HIGH (ref 11.4–15.2)

## 2022-04-23 LAB — MAGNESIUM: Magnesium: 1.8 mg/dL (ref 1.7–2.4)

## 2022-04-23 LAB — PROCALCITONIN: Procalcitonin: 48.28 ng/mL

## 2022-04-23 MED ORDER — SODIUM CHLORIDE 0.9 % IV SOLN
500.0000 mg | INTRAVENOUS | Status: DC
  Administered 2022-04-23 – 2022-05-02 (×10): 500 mg via INTRAVENOUS
  Filled 2022-04-23 (×8): qty 10
  Filled 2022-04-23: qty 500
  Filled 2022-04-23 (×2): qty 10

## 2022-04-23 MED ORDER — INSULIN DETEMIR 100 UNIT/ML ~~LOC~~ SOLN
5.0000 [IU] | Freq: Every day | SUBCUTANEOUS | Status: DC
  Administered 2022-04-23 – 2022-05-01 (×9): 5 [IU] via SUBCUTANEOUS
  Filled 2022-04-23 (×10): qty 0.05

## 2022-04-23 MED ORDER — VANCOMYCIN HCL IN DEXTROSE 1-5 GM/200ML-% IV SOLN
1000.0000 mg | INTRAVENOUS | Status: DC
  Administered 2022-04-24: 1000 mg via INTRAVENOUS
  Filled 2022-04-23: qty 200

## 2022-04-23 MED ORDER — INSULIN DETEMIR 100 UNIT/ML ~~LOC~~ SOLN: 10.0000 [IU] | Freq: Every day | SUBCUTANEOUS | Status: DC

## 2022-04-23 MED ORDER — SODIUM CHLORIDE 0.9% IV SOLUTION: Freq: Once | INTRAVENOUS | Status: AC

## 2022-04-23 MED ORDER — CHLORHEXIDINE GLUCONATE CLOTH 2 % EX PADS
6.0000 | MEDICATED_PAD | Freq: Every day | CUTANEOUS | Status: DC
  Administered 2022-04-25 – 2022-05-02 (×7): 6 via TOPICAL

## 2022-04-23 MED ORDER — PANTOPRAZOLE SODIUM 40 MG IV SOLR
40.0000 mg | Freq: Two times a day (BID) | INTRAVENOUS | Status: DC
  Administered 2022-04-23 – 2022-04-28 (×12): 40 mg via INTRAVENOUS
  Filled 2022-04-23 (×13): qty 10

## 2022-04-23 MED ORDER — POTASSIUM CHLORIDE 10 MEQ/100ML IV SOLN
10.0000 meq | INTRAVENOUS | Status: AC
  Administered 2022-04-23 (×2): 10 meq via INTRAVENOUS
  Filled 2022-04-23 (×2): qty 100

## 2022-04-23 MED ORDER — INSULIN ASPART 100 UNIT/ML IJ SOLN
0.0000 [IU] | Freq: Three times a day (TID) | INTRAMUSCULAR | Status: DC
  Administered 2022-04-23 (×2): 3 [IU] via SUBCUTANEOUS
  Administered 2022-04-23: 2 [IU] via SUBCUTANEOUS
  Administered 2022-04-24 (×2): 5 [IU] via SUBCUTANEOUS
  Administered 2022-04-25 (×3): 3 [IU] via SUBCUTANEOUS
  Administered 2022-04-26: 5 [IU] via SUBCUTANEOUS
  Administered 2022-04-26: 3 [IU] via SUBCUTANEOUS
  Administered 2022-04-27: 2 [IU] via SUBCUTANEOUS
  Administered 2022-04-27: 5 [IU] via SUBCUTANEOUS
  Administered 2022-04-28 (×2): 3 [IU] via SUBCUTANEOUS
  Administered 2022-04-28: 1 [IU] via SUBCUTANEOUS
  Administered 2022-04-30: 2 [IU] via SUBCUTANEOUS
  Administered 2022-04-30: 5 [IU] via SUBCUTANEOUS
  Administered 2022-05-01 (×2): 2 [IU] via SUBCUTANEOUS
  Administered 2022-05-02: 3 [IU] via SUBCUTANEOUS

## 2022-04-23 MED ORDER — LACTULOSE 10 GM/15ML PO SOLN
10.0000 g | Freq: Three times a day (TID) | ORAL | Status: DC
  Administered 2022-04-23 – 2022-05-02 (×25): 10 g via ORAL
  Filled 2022-04-23 (×25): qty 30

## 2022-04-23 MED ORDER — INSULIN ASPART 100 UNIT/ML IJ SOLN
0.0000 [IU] | Freq: Every day | INTRAMUSCULAR | Status: DC
  Administered 2022-04-23 – 2022-05-01 (×3): 2 [IU] via SUBCUTANEOUS

## 2022-04-23 MED ORDER — SODIUM CHLORIDE 0.9 % IV SOLN: 2.0000 g | INTRAVENOUS | Status: DC

## 2022-04-23 NOTE — Assessment & Plan Note (Addendum)
-  Febrile up to 103, tacky up to 95, tachypneic up to 27, 2.6, lactic acid 4.6 at time of admission; Patient met criteria for sepsis. -Positive blood cultures for gram-negative rods bacteremia and chest x-ray demonstrating pneumonia. -Blood cultures have turned out to be positive for gram-negative rods 4 out of 4; antibiotics adjusted to meropenem (as part of treatment for bacteremia). -Continue to follow clinical response -Continue the use of flutter valve and bronchodilator management. -Patient was treated with vancomycin and meropenem; he has completed 5 days of antibiotic therapy. -MRSA PCR negative; vancomycin discontinued. -I discussed with infectious disease plan is for a total of 10 days; patient will require 5 more days of adjustment of pending while inpatient.

## 2022-04-23 NOTE — Assessment & Plan Note (Addendum)
-  Continue sliding scale insulin -A1c 6.1 -Follow CBGs fluctuation.

## 2022-04-23 NOTE — Assessment & Plan Note (Addendum)
-   Blood pressure overall stable -Continue metoprolol -Follow-up vital signs.

## 2022-04-23 NOTE — Assessment & Plan Note (Addendum)
-  Continue PPI twice a day.

## 2022-04-23 NOTE — Progress Notes (Addendum)
PHARMACY - PHYSICIAN COMMUNICATION CRITICAL VALUE ALERT - BLOOD CULTURE IDENTIFICATION (BCID)  Jeffrey [name] is an 76 y.o. male who presented to [facility] on 04/22/2022 with a chief complaint of Sepsis 2nd pneumonia  Assessment:  4/4 Blood cultures positive for ESBL Ecoli   Name of physician (or Provider) Contacted: Dr. Dyann Kief  Current antibiotics:  Cefepime 1gm q HD Vancomycin 1gm q HD Azithromycin '500mg'$  IV daily  Changes to prescribed antibiotics recommended:  DC Cefepime and Azithromycin Start Meropenem '500mg'$  IV Q 24h Continue Vancomycin  Results for orders placed or performed during the hospital encounter of 04/22/22  Blood Culture ID Panel (Reflexed) (Collected: 04/22/2022  8:05 PM)  Result Value Ref Range   Enterococcus faecalis NOT DETECTED NOT DETECTED   Enterococcus Faecium NOT DETECTED NOT DETECTED   Listeria monocytogenes NOT DETECTED NOT DETECTED   Staphylococcus species NOT DETECTED NOT DETECTED   Staphylococcus aureus (BCID) NOT DETECTED NOT DETECTED   Staphylococcus epidermidis NOT DETECTED NOT DETECTED   Staphylococcus lugdunensis NOT DETECTED NOT DETECTED   Streptococcus species NOT DETECTED NOT DETECTED   Streptococcus agalactiae NOT DETECTED NOT DETECTED   Streptococcus pneumoniae NOT DETECTED NOT DETECTED   Streptococcus pyogenes NOT DETECTED NOT DETECTED   A.calcoaceticus-baumannii NOT DETECTED NOT DETECTED   Bacteroides fragilis NOT DETECTED NOT DETECTED   Enterobacterales DETECTED (A) NOT DETECTED   Enterobacter cloacae complex NOT DETECTED NOT DETECTED   Escherichia coli DETECTED (A) NOT DETECTED   Klebsiella aerogenes NOT DETECTED NOT DETECTED   Klebsiella oxytoca NOT DETECTED NOT DETECTED   Klebsiella pneumoniae NOT DETECTED NOT DETECTED   Proteus species NOT DETECTED NOT DETECTED   Salmonella species NOT DETECTED NOT DETECTED   Serratia marcescens NOT DETECTED NOT DETECTED   Haemophilus influenzae NOT DETECTED NOT DETECTED   Neisseria  meningitidis NOT DETECTED NOT DETECTED   Pseudomonas aeruginosa NOT DETECTED NOT DETECTED   Stenotrophomonas maltophilia NOT DETECTED NOT DETECTED   Candida albicans NOT DETECTED NOT DETECTED   Candida auris NOT DETECTED NOT DETECTED   Candida glabrata NOT DETECTED NOT DETECTED   Candida krusei NOT DETECTED NOT DETECTED   Candida parapsilosis NOT DETECTED NOT DETECTED   Candida tropicalis NOT DETECTED NOT DETECTED   Cryptococcus neoformans/gattii NOT DETECTED NOT DETECTED   CTX-M ESBL DETECTED (A) NOT DETECTED   Carbapenem resistance IMP NOT DETECTED NOT DETECTED   Carbapenem resistance KPC NOT DETECTED NOT DETECTED   Carbapenem resistance NDM NOT DETECTED NOT DETECTED   Carbapenem resist OXA 48 LIKE NOT DETECTED NOT DETECTED   Carbapenem resistance VIM NOT DETECTED NOT DETECTED    [name] 04/23/2022  2:52 PM

## 2022-04-23 NOTE — TOC Progression Note (Signed)
  Transition of Care [facility]) Screening Note   Patient Details  Name: Jeffrey [name] Date of Birth: [birth year]   Transition of Care [facility]) CM/SW Contact:    Boneta Lucks, RN Phone Number: 04/23/2022, 10:55 AM  Patient active with [facility]/PT  Transition of Care Department [facility]) has reviewed patient and no TOC needs have been identified at this time. We will continue to monitor patient advancement through interdisciplinary progression rounds. If new patient transition needs arise, please place a TOC consult.     Expected Discharge Plan: [place] Barriers to Discharge: Continued Medical Work up  Expected Discharge Plan and Services Expected Discharge Plan: [place] arrangements for the past 2 months: Single Family Home                    Readmission Risk Interventions    02/07/2022   12:34 PM 01/20/2021    2:33 PM 04/03/2020   10:46 AM  Readmission Risk Prevention Plan  Transportation Screening Complete Complete Complete  PCP or Specialist Appt within 3-5 Days Complete Complete   HRI or Home Care Consult Complete Complete   Social Work Consult for [place] Planning/Counseling Complete Complete   Palliative Care Screening Not Applicable Not Applicable   Medication Review [employer]) Complete Complete Complete  HRI or Home Care Consult   Complete  SW Recovery Care/Counseling Consult   Complete  [place]   Not Applicable

## 2022-04-23 NOTE — Assessment & Plan Note (Addendum)
-   Monday, Wednesday and Friday outpatient HD schedule -Well-tolerated dialysis on 04/27/2022; next treatment anticipated for 04/29/2022. -Appears to be stable overall. -Continue to follow nephrology recommendations

## 2022-04-23 NOTE — Assessment & Plan Note (Addendum)
-   On 2-3 L nasal cannula at baseline - Currently maintaining oxygen sats on the same supplementation. - Continue as needed bronchodilator management. -Reports breathing is essentially back to baseline currently.

## 2022-04-23 NOTE — Consult Note (Signed)
Maylon Peppers, M.D. Gastroenterology & Hepatology                                           Patient Name: Jeffrey [name] Account #: _0 @   MRN: [id number] Admission Date: 04/22/2022 Date of Evaluation:  04/23/2022 Time of Evaluation: 11:42 AM   Referring Physician: Barton Dubois, MD  Chief Complaint:  Anemia  HPI:  This is a 76 y.o. 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  Patient is a poor historian.  He reported that yesterday he presented new onset of fever up to 101 at home and was having frequent chills.  He also endorsed having some dry cough recently.  Patient was admitted to the hospital with a presumptive diagnosis of sepsis due to pneumonia versus UTI.  He was found to have worsening anemia for which gastroenterology was consulted.  The patient states that he has not had any melena or hematochezia.  Has had some recent fatigue.  Denies taking any NSAIDs or anticoagulants.  He has been following at the hematology clinic.  He has presented chronic history of pancytopenia due to end-stage renal disease, cirrhosis and losses from portal hypertensive gastropathy.  He was last transfused 1 unit of PRBC on 04/21/2022 as outpatient.  Notably, the patient was last seen in the hospital on 02/20/2022 due to evaluation of acute on chronic anemia.  Given his extensive workup and comorbidities, as well as his nontransplant candidate status, he was considered he should have conservative management.  At that time, patient was advised to undergo evaluation for possible TIPS placement.  However, he was evaluated as outpatient and he was considered to be a a poor candidate for this type of  therapy given his elevated MELD score (evaluated by Dr. Annamaria Boots).  Upon review of his labs, the patient was admitted with a hemoglobin of 6.8 with an MCV of 104, platelets of 79,000, CMP had normal BUN of 23, sodium 132, potassium 3.0, albumin 2.0, AST 52, ALT 26, total bilirubin 1.0, INR was 1.5. Patient ordered one unit PRBC.  Notably, the patient denies having any melena or hematochezia.  He denies taking any anticoagulation or NSAIDs.  No abdominal pain, nausea, vomiting or abdominal distention.  Past Medical History: SEE CHRONIC ISSSUES: Past Medical History:  Diagnosis Date   A-fib [facility])    when initially stating dialysis january 2021 at [facility] per spouse; never heard anything else about it   Alcoholic cirrhosis [facility])    patient reports completing hep a and b vaccines in 2006   Anemia    has had 3 units of prbcs 2013   Anxiety    Arthritis    Asymptomatic gallstones    Ultrasound in 2006   B12 deficiency 11/21/2020   C. difficile colitis    Cholelithiasis    Chronic kidney disease    Dialysis M/W/F/Sa   Depression    Diabetes ([place])    Duodenal ulcer with hemorrhage    per patient in 2006 or 2007, records have been requested   Dyspnea    low oxygen level -  has O2 2 L all day   Esophageal varices ([place])  see [facility]   GERD (gastroesophageal reflux disease)    Heart burn    History of alcohol abuse    quit 05/2013   HOH (hard of hearing)    Hypertension    Liver cirrhosis ([place])    ETOH   SBP (spontaneous bacterial peritonitis) ([place])    2020, November 2021.    Splenomegaly    Ultrasound in 2006   Past Surgical History:  Past Surgical History:  Procedure Laterality Date   AGILE CAPSULE N/A 09/20/2019   Procedure: AGILE CAPSULE;  Surgeon: Daneil Dolin, MD;  [facility];  Service: Endoscopy;  Laterality: N/A;   APPENDECTOMY     AV FISTULA PLACEMENT Left 08/17/2019   Procedure: ARTERIOVENOUS (AV) FISTULA CREATION VERSES ARTERIOVENOUS GRAFT;   Surgeon: [name], MD;  Location: [facility] OR;  Service: Vascular;  Laterality: Left;   BASCILIC VEIN TRANSPOSITION Left 09/28/2019   Procedure: LEFT SECOND STAGE [place];  Surgeon: [name], MD;  Location: [place];  Service: Vascular;  Laterality: Left;   BIOPSY  02/03/2018   Procedure: BIOPSY;  Surgeon: Daneil Dolin, MD;  [facility];  Service: Endoscopy;;  bx of gastric polyps   BIOPSY  09/19/2019   Procedure: BIOPSY;  Surgeon: Danie Binder, MD;  [facility];  Service: Endoscopy;;   BIOPSY  01/16/2021   Procedure: BIOPSY;  Surgeon: Daneil Dolin, MD;  [facility];  Service: Endoscopy;;   CATARACT EXTRACTION Right    COLONOSCOPY  03/10/2005   Rectal polyp as described above, removed with snare. Left sided  diverticula. The remainder of the colonic mucosa appeared normal. Inflammed polyp on path.   COLONOSCOPY  04/1999   Dr. Thea Silversmith polyps removed,  Path showed hyperplastic   COLONOSCOPY  10/2015   Dr. Britta Mccreedy: diverticulosis, single sessile tubular adenoma 3-4m in size removed from descending colon.    COLONOSCOPY WITH ESOPHAGOGASTRODUODENOSCOPY (EGD)  03/31/2012   RMIW:[id number]. Colonic diverticulosis. tubular adenoma colon   COLONOSCOPY WITH ESOPHAGOGASTRODUODENOSCOPY (EGD)  06/2019   FORSYTH: small esophageal varices without high risk stigmata, single large AVM on lesser curvature in gastric body s/p APC ablation, gastric antral and duodenal bulb polyposis. No significant source to explain transfusion dependent anemia. Colonoscopy with portal colopathy and diffuse edema, changes of Cdiff colitis on colonoscopy   COLONOSCOPY WITH PROPOFOL N/A 01/19/2021   Surgeon: CDaryel November MD; 15 mm polyp in descending colon removed piecemeal, 6 mm polyp in ascending colon and 12 mm polyp in sigmoid colon not removed due to high bleeding risk, benign polypoid lesion at splenic flexure, diverticulosis in sigmoid and  descending colon, rectal varices.   COLONOSCOPY WITH PROPOFOL N/A 01/16/2021   Surgeon: RDaneil Dolin MD;  Portal colopathy, large rectal varices, polypoid mass at splenic flexure biopsy, two 1 cm left colon polyps not removed, left-sided diverticulosis.  Reported patient had bleeding from splenic flexure lesion.  Pathology with acutely inflamed colonic mucosa with localized ulceration and prominent reactive changes.   ESOPHAGEAL BANDING N/A 02/03/2018   Procedure: ESOPHAGEAL BANDING;  Surgeon: RDaneil Dolin MD;  [facility];  Service: Endoscopy;  Laterality: N/A;   ESOPHAGEAL BANDING  01/16/2021   Procedure: ESOPHAGEAL BANDING;  Surgeon: RDaneil Dolin MD;  [facility];  Service: Endoscopy;;   ESOPHAGOGASTRODUODENOSCOPY  01/15/2005   Three columns grade 1 to 2 esophageal varices, otherwise normal esophageal mucosa.  Esophagus was not manipulated otherwise./Nodularity of the antrum with  overlying erosions, nonspecific finding. Path showed rare H.pylori   ESOPHAGOGASTRODUODENOSCOPY  09/2008   Dr. Gaylene Brooks columns of grade 2-3 esoph varices, only one column was prominent. Portal gastropathy, multiple gastrc polyps at antrum, two were 2cm with black eschar, bulbar polyps, bulbar erosions   ESOPHAGOGASTRODUODENOSCOPY  11/2004   Dr. Brantley Stage 3 esoph varices   ESOPHAGOGASTRODUODENOSCOPY  03/31/2012   RMR: 4 columns(3-GR 2, 1-GR1) non-bleeding esophageal varices, portal gastropathy, small HH, early GAVE, multiple gastric polyps    ESOPHAGOGASTRODUODENOSCOPY (EGD) WITH PROPOFOL N/A 02/03/2018   Dr. Gala Romney: Esophageal varices, 3 columns grade 1-2.  Portal hypertensive gastropathy.  Multiple gastric polyps, biopsy consistent with hyperplastic.   ESOPHAGOGASTRODUODENOSCOPY (EGD) WITH PROPOFOL N/A 09/19/2019   Fields: grade I and II esophageal varcies, mild portal hypertensive gastropathy, moderate gastritis but no H. pylori, single hyperplastic gastric polyp  removed, obvious source for melena/transfusion dependent anemia not identified, may be due to friable gastric and duodenal mucosa in the setting of portal hypertension   ESOPHAGOGASTRODUODENOSCOPY (EGD) WITH PROPOFOL N/A 01/16/2021   Surgeon: Daneil Dolin, MD; Portal hypertensive gastropathy, grade 2 esophageal varices with bleeding stigmata s/p banding x4, slight nodularity and congestion involving bulb and second portion of duodenum, nonspecific.  Recommended repeat EGD in 4 weeks.   ESOPHAGOGASTRODUODENOSCOPY (EGD) WITH PROPOFOL N/A 05/22/2021   stigmata of prior esophageal banding and no significant varices seen. Portal gastropathy. Polypoid gastric and duodenal mucosa with overlying erosions, marked mucosal friability.   ESOPHAGOGASTRODUODENOSCOPY (EGD) WITH PROPOFOL N/A 02/06/2022   Procedure: ESOPHAGOGASTRODUODENOSCOPY (EGD) WITH PROPOFOL;  Surgeon: Harvel Quale, MD;  [facility];  Service: Gastroenterology;  Laterality: N/A;   GASTRIC VARICES BANDING  03/31/2012   Procedure: GASTRIC VARICES BANDING;  Surgeon: Daneil Dolin, MD;  [facility];  Service: Endoscopy;  Laterality: N/A;   GIVENS CAPSULE STUDY N/A 09/21/2019   Poor study with a lot of debris obstructing much of the view of the first approximate 3 hours out of 6 hours.  No obvious source of bleeding identified.  Sequela of bleeding and old blood seen in the form of"whisps" of blood, blood flecks mostly toward the end of the study.   HEMOSTASIS CLIP PLACEMENT  01/19/2021   Procedure: HEMOSTASIS CLIP PLACEMENT;  Surgeon: [name], MD;  Location: [facility] ENDOSCOPY;  Service: Gastroenterology;;   HEMOSTASIS CLIP PLACEMENT  02/06/2022   Procedure: HEMOSTASIS CLIP PLACEMENT;  Surgeon: Montez Morita, Quillian Quince, MD;  [facility];  Service: Gastroenterology;;  gastric body   HOT HEMOSTASIS  02/06/2022   Procedure: HOT HEMOSTASIS (ARGON PLASMA COAGULATION/BICAP);  Surgeon: Montez Morita,  Quillian Quince, MD;  [facility];  Service: Gastroenterology;;   IR REMOVAL TUN CV CATH W/O [employer]  02/15/2020   POLYPECTOMY  09/19/2019   Procedure: POLYPECTOMY;  Surgeon: Danie Binder, MD;  [facility];  Service: Endoscopy;;   POLYPECTOMY  01/19/2021   Procedure: POLYPECTOMY;  Surgeon: [name], MD;  Location: [facility] ENDOSCOPY;  Service: Gastroenterology;;   UMBILICAL HERNIA REPAIR  2017   exploratory laparotomy, abdominal washout   Family History:  Family History  Problem Relation Age of Onset   Colon cancer Father 46       deceased age 81   Breast cancer Sister        deceased   Diabetes Sister    Stroke Mother    Healthy Son    Healthy Daughter    Lung cancer Neg Hx    Ovarian cancer Neg Hx  Social History:  Social History   Tobacco Use   Smoking status: Former    Packs/day: 2.00    Years: 25.00    Total pack years: 50.00    Types: Cigarettes    Quit date: 01/28/1986    Years since quitting: 36.2   Smokeless tobacco: Never  Vaping Use   Vaping Use: Never used  Substance Use Topics   Alcohol use: No    Comment: quit in 05/2013   Drug use: No    Home Medications:  Prior to Admission medications   Medication Sig Start Date End Date Taking? Authorizing Provider  acetaminophen (TYLENOL) 650 MG CR tablet Take 1,300 mg by mouth every 8 (eight) hours as needed for pain.   Yes [provider]  Acidophilus Lactobacillus CAPS Take 1 tablet by mouth daily.   Yes [provider]  Alcohol Swabs (ALCOHOL PREP) 70 % PADS Apply 1 each topically 3 (three) times daily. 01/31/20  Yes [provider]  allopurinol (ZYLOPRIM) 300 MG tablet Take 300 mg by mouth daily. 12/11/21  Yes [provider]  ALPRAZolam Duanne Moron) 0.5 MG tablet Take 1 tablet (0.5 mg total) by mouth See admin instructions. Take one tablet (0.5 mg) by mouth on Monday, Wednesday, Friday before dialysis, may also take one tablet (0.5 mg) daily as needed for  anxiety. 02/07/22  Yes Tat, Shanon Brow, MD  Ascorbic Acid (VITAMIN C) 1000 MG tablet Take 1,000 mg by mouth in the morning.   Yes [provider]  calcium carbonate (TUMS - DOSED IN MG ELEMENTAL CALCIUM) 500 MG chewable tablet Chew 1 tablet by mouth 3 (three) times daily with meals.   Yes [provider]  cetirizine (ZYRTEC) 10 MG tablet Take 10 mg by mouth daily. 06/24/21  Yes [provider]  cholecalciferol (VITAMIN D3) 25 MCG (1000 UNIT) tablet Take 1,000 Units by mouth daily.   Yes [provider]  diltiazem (TIAZAC) 120 MG 24 hr capsule Take 120 mg by mouth daily. 02/17/22  Yes [provider]  famotidine (PEPCID) 20 MG tablet Take 1 tablet (20 mg total) by mouth 2 (two) times daily. 07/30/21 07/30/22 Yes Barton Dubois, MD  furosemide (LASIX) 40 MG tablet Take 80 mg by mouth daily. 12/29/19  Yes [provider]  GLUCAGEN HYPOKIT 1 MG SOLR injection Inject 1 mg into the muscle once as needed for low blood sugar. 11/28/20  Yes [provider]  HUMALOG KWIKPEN 100 UNIT/ML KwikPen 10-50 Units 3 (three) times daily. Sliding scale 04/23/21  Yes [provider]  hydrOXYzine (ATARAX/VISTARIL) 25 MG tablet Take 25 mg by mouth 3 (three) times daily. For itching 11/23/20  Yes [provider]  iron polysaccharides (NIFEREX) 150 MG capsule Take 150 mg by mouth daily with supper.   Yes [provider]  lactulose (CHRONULAC) 10 GM/15ML solution Take 15 mLs (10 g total) by mouth 2 (two) times daily. 02/07/22  Yes Tat, Shanon Brow, MD  metoprolol succinate (TOPROL-XL) 25 MG 24 hr tablet Take 1 tablet (25 mg total) by mouth daily. 02/22/22  Yes Tat, Shanon Brow, MD  mirtazapine (REMERON) 30 MG tablet Take 1 tablet (30 mg total) by mouth at bedtime. 02/10/20  Yes Roxan Hockey, MD  Multiple Vitamin (MULTIVITAMIN WITH MINERALS) TABS tablet Take 1 tablet by mouth in the morning.    Yes [provider]  omega-3 acid ethyl esters (LOVAZA) 1 g  capsule Take 2 g by mouth 2 (two) times daily.  04/07/19  Yes [provider]  ondansetron (ZOFRAN) 4 MG tablet Take 4 mg by mouth every 8 (eight) hours as needed for nausea or vomiting.   Yes [provider]  pantoprazole (PROTONIX) 40 MG tablet Take 1 tablet (40 mg total) by mouth 2 (two) times daily. 02/21/22  Yes Tat, Shanon Brow, MD  pravastatin (PRAVACHOL) 20 MG tablet Take 20 mg by mouth in the morning.    Yes [provider]  SSD 1 % cream Apply 2 Applications topically 2 (two) times daily. 02/26/22  Yes [provider]  sulfamethoxazole-trimethoprim (BACTRIM DS) 800-160 MG tablet TAKE 1 TABLET BY MOUTH every friday AFTER dialysis 03/12/22  Yes Annitta Needs, NP  thiamine (VITAMIN B-1) 100 MG tablet Take 100 mg by mouth daily. 08/27/20  Yes [provider]  tiZANidine (ZANAFLEX) 2 MG tablet Take 2 mg by mouth 2 (two) times daily as needed. 04/07/22  Yes [provider]  TRESIBA FLEXTOUCH 200 UNIT/ML FlexTouch Pen Inject 10-120 Units into the skin daily. Sliding scale 02/17/22  Yes [provider]  triamcinolone cream (KENALOG) 0.1 % Apply 1 application topically 2 (two) times daily as needed (dry skin Itching). 12/30/17  Yes [provider]  venlafaxine (EFFEXOR) 75 MG tablet Take 75 mg by mouth 3 (three) times daily with meals.    Yes [provider]  XIFAXAN 550 MG TABS tablet TAKE 1 TABLET BY MOUTH TWICE DAILY 02/12/22  Yes Annitta Needs, NP  BD PEN NEEDLE MICRO U/F 32G X 6 MM MISC SMARTSIG:SUB-Q Daily PRN 04/23/21   [provider]  colchicine 0.6 MG tablet Take 0.6 mg by mouth 2 (two) times daily. Patient not taking: Reported on 04/22/2022 02/26/22   [provider]  Continuous Blood Gluc Sensor (FREESTYLE LIBRE 2 SENSOR) MISC Apply topically. 10/15/21   [provider]    Inpatient Medications:  Current Facility-Administered Medications:    acetaminophen (TYLENOL) tablet 650 mg, 650 mg, Oral,  Q6H PRN **OR** acetaminophen (TYLENOL) suppository 650 mg, 650 mg, Rectal, Q6H PRN, Zierle-Ghosh, Asia B, DO   albuterol (PROVENTIL) (2.5 MG/3ML) 0.083% nebulizer solution 2.5 mg, 2.5 mg, Nebulization, Q2H PRN, Zierle-Ghosh, Asia B, DO   ALPRAZolam (XANAX) tablet 0.5 mg, 0.5 mg, Oral, Daily PRN, Zierle-Ghosh, Asia B, DO   azithromycin (ZITHROMAX) 500 mg in sodium chloride 0.9 % 250 mL IVPB, 500 mg, Intravenous, Q24H, Zierle-Ghosh, Asia B, DO, Last Rate: 250 mL/hr at 04/23/22 0027, 500 mg at 04/23/22 0027   [START ON 04/24/2022] ceFEPIme (MAXIPIME) 2 g in sodium chloride 0.9 % 100 mL IVPB, 2 g, Intravenous, Q M,W,F-HD, Madueme, Elvira C, RPH   Chlorhexidine Gluconate Cloth 2 % PADS 6 each, 6 each, Topical, Q0600, Zierle-Ghosh, Asia B, DO, 6 each at 04/23/22 1607   Chlorhexidine Gluconate Cloth 2 % PADS 6 each, 6 each, Topical, Q0600, Corliss Parish, MD   famotidine (PEPCID) tablet 20 mg, 20 mg, Oral, BID, Zierle-Ghosh, Asia B, DO, 20 mg at 04/23/22 0826   hydrOXYzine (ATARAX) tablet 25 mg, 25 mg, Oral, TID, Zierle-Ghosh, Asia B, DO, 25 mg at 04/23/22 0827   insulin aspart (novoLOG) injection 0-15 Units, 0-15 Units, Subcutaneous, TID WC, Zierle-Ghosh, Asia B, DO, 2 Units at 04/23/22 0824   insulin aspart (novoLOG) injection 0-5 Units, 0-5 Units, Subcutaneous, QHS, Zierle-Ghosh, Asia B, DO   insulin detemir (LEVEMIR) injection 5 Units, 5 Units, Subcutaneous, QHS, Zierle-Ghosh, Asia B, DO   lactulose (CHRONULAC) 10 GM/15ML solution 10 g, 10 g, Oral, BID, Zierle-Ghosh, Asia B, DO, 10 g at 04/23/22 [phone]  metoprolol succinate (TOPROL-XL) 24 hr tablet 25 mg, 25 mg, Oral, Daily, Zierle-Ghosh, Asia B, DO, 25 mg at 04/23/22 0827   mirtazapine (REMERON) tablet 30 mg, 30 mg, Oral, QHS, Zierle-Ghosh, Asia B, DO, 30 mg at 04/23/22 0000   ondansetron (ZOFRAN) tablet 4 mg, 4 mg, Oral, Q6H PRN **OR** ondansetron (ZOFRAN) injection 4 mg, 4 mg, Intravenous, Q6H PRN, Zierle-Ghosh, Asia B, DO   oxyCODONE (Oxy  IR/ROXICODONE) immediate release tablet 5 mg, 5 mg, Oral, Q4H PRN, Zierle-Ghosh, Asia B, DO   pantoprazole (PROTONIX) injection 40 mg, 40 mg, Intravenous, Q12H, Zierle-Ghosh, Asia B, DO, 40 mg at 04/23/22 0420   polyethylene glycol (MIRALAX / GLYCOLAX) packet 17 g, 17 g, Oral, Daily PRN, Zierle-Ghosh, Asia B, DO   pravastatin (PRAVACHOL) tablet 20 mg, 20 mg, Oral, Daily, Zierle-Ghosh, Asia B, DO, 20 mg at 04/23/22 0827   rifaximin (XIFAXAN) tablet 550 mg, 550 mg, Oral, BID, Zierle-Ghosh, Asia B, DO, 550 mg at 04/23/22 0827   [START ON 04/24/2022] vancomycin (VANCOCIN) IVPB 1000 mg/200 mL premix, 1,000 mg, Intravenous, Q M,W,F-HD, Madueme, Elvira C, RPH   venlafaxine (EFFEXOR) tablet 75 mg, 75 mg, Oral, TID WC, Zierle-Ghosh, Asia B, DO, 75 mg at 04/23/22 4034 Allergies: Tape  Complete Review of Systems: GENERAL: negative for malaise, night sweats HEENT: No changes in hearing or vision, no nose bleeds or other nasal problems. NECK: Negative for lumps, goiter, pain and significant neck swelling RESPIRATORY: Negative for cough, wheezing CARDIOVASCULAR: Negative for chest pain, leg swelling, palpitations, orthopnea GI: SEE HPI MUSCULOSKELETAL: Negative for joint pain or swelling, back pain, and muscle pain. SKIN: Negative for lesions, rash PSYCH: Negative for sleep disturbance, mood disorder and recent psychosocial stressors. HEMATOLOGY Negative for prolonged bleeding, bruising easily, and swollen nodes. ENDOCRINE: Negative for cold or heat intolerance, polyuria, polydipsia and goiter. NEURO: negative for tremor, gait imbalance, syncope and seizures. The remainder of the review of systems is noncontributory.  Physical Exam: BP (!) 141/46   Pulse 79   Temp 98.4 F (36.9 C) (Oral)   Resp (!) 23   Ht _0  (1.702 m)   Wt 81 kg   SpO2 97%   BMI 27.97 kg/m  GENERAL: The patient is AO x3, in no acute distress. On [place] at 3 L min. HEENT: Head is normocephalic and atraumatic. EOMI are intact.  Mouth is well hydrated and without lesions. NECK: Supple. No masses LUNGS: Clear to auscultation. No presence of rhonchi/wheezing/rales. Adequate chest expansion HEART: RRR, normal s1 and s2. ABDOMEN: Soft, nontender, no guarding, no peritoneal signs, and nondistended. BS +. No masses. EXTREMITIES: Without any cyanosis, clubbing, rash, lesions or edema. NEUROLOGIC: AOx3, no focal motor deficit. SKIN: no jaundice, no rashes  Laboratory Data CBC:     Component Value Date/Time   WBC 6.8 04/23/2022 0239   RBC 2.12 (L) 04/23/2022 0239   HGB 6.8 (LL) 04/23/2022 0239   HCT 22.2 (L) 04/23/2022 0239   HCT 28 02/25/2012 1034   PLT 79 (L) 04/23/2022 0239   PLT 125 02/25/2012 1034   MCV 104.7 (H) 04/23/2022 0239   MCV 82.0 02/25/2012 1034   MCH 32.1 04/23/2022 0239   MCHC 30.6 04/23/2022 0239   RDW 22.5 (H) 04/23/2022 0239   LYMPHSABS 0.1 (L) 04/23/2022 0239   MONOABS 0.3 04/23/2022 0239   EOSABS 0.0 04/23/2022 0239   BASOSABS 0.0 04/23/2022 0239   COAG:  Lab Results  Component Value Date   INR 1.5 (H) 04/23/2022   INR 1.3 (H) 04/22/2022  INR 1.2 02/19/2022    BMP:     Latest Ref Rng & Units 04/23/2022    2:39 AM 04/22/2022    7:40 PM 04/16/2022    1:15 PM  BMP  Glucose 70 - 99 mg/dL 114  129  169   BUN 8 - 23 mg/dL 23  20  38   Creatinine 0.61 - 1.24 mg/dL 2.23  2.05  3.25   Sodium 135 - 145 mmol/L 132  132  133   Potassium 3.5 - 5.1 mmol/L 3.0  2.8  3.5   Chloride 98 - 111 mmol/L 98  92  95   CO2 22 - 32 mmol/L _0 Calcium 8.9 - 10.3 mg/dL 7.4  8.1  8.3     HEPATIC:     Latest Ref Rng & Units 04/23/2022    2:39 AM 04/22/2022    7:40 PM 04/16/2022    1:15 PM  Hepatic Function  Total Protein 6.5 - 8.1 g/dL 5.0  6.3  6.1   Albumin 3.5 - 5.0 g/dL 2.0  2.4  2.6   AST 15 - 41 U/L 52  43  25   ALT 0 - 44 U/L _1 Alk Phosphatase 38 - 126 U/L 235  254  208   Total Bilirubin 0.3 - 1.2 mg/dL 1.0  1.5  0.5     CARDIAC:  Lab Results  Component Value  Date   TROPONINI <0.03 01/10/2016     Imaging: I personally reviewed and interpreted the available imaging.  Assessment & Plan: WYMAN MESCHKE is a 76 y.o. 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  The patient was found to have sepsis due to pneumonia for which he is undergoing antibiotic treatment and management at the ICU.  Gastroenterology was consulted for recurrent anemia.  The patient has had extensive endoscopic investigations.  So far, the evaluation has been unremarkable for possible intermittent slow losses from severe portal hypertensive gastropathy.  He has not presented any overt gastrointestinal bleeding recently.  He was referred for TIPS evaluation but given his MELD score he was not considered an adequate candidate for this.  He has received transfusions as needed.  For now, given his critical status in the setting of infection, we will recommend continuing PPI twice daily and checking the H&H every day as a, conservative management but if he were to have overt bleeding or recurrent requirement of transfusions, may consider repeating an EGD during the current hospitalization once he has stabilized.  Overall, given his comorbidities and advanced liver disease with nontransplant candidacy status, we will be important to have an evaluation by palliative care regarding goals of care.  He has not presented any other overt decompensating events recently, but he is presenting some asterixis concerning for ongoing HE in the setting of acute infection. Will need to continue Lactulose and Xifaxan to achieve 2-3 Bms per day.  MELD today is 23.  - H/H qday - Pantoprazole 40 mg BID -  Heart healthy diet - Daily MELD labs - Lactulose 10 mg TID, titrate to achieve 2-3 Bms per day - Xifaxan 550 mg BID - Restart home diuretics once HD stable - Pneumonia sepsis management per primary team -  Palliative care consult  Maylon Peppers, MD Gastroenterology and Hepatology [facility] Gastroenterology

## 2022-04-23 NOTE — Assessment & Plan Note (Addendum)
-   Continue to follow electrolytes trend -Continue hemodialysis to further stabilize electrolytes.

## 2022-04-23 NOTE — H&P (Signed)
History and Physical    Patient: Jeffrey [name]:[id number] DOB: May 10, 1946 DOA: 04/22/2022 DOS: the patient was seen and examined on 04/23/2022 PCP: Monico Blitz, MD  Patient coming from: Home  Chief Complaint: Fever  HPI: Jeffrey [name] is a 76 y.o. male with medical history significant of atrial fibrillation, alcoholic cirrhosis, anxiety, chronic kidney disease, diabetes mellitus type 2, GERD, esophageal varices, hypertension, hard of hearing, and more presents the ED with a chief complaint of fever.  Unfortunately at the time of my exam, patient cannot stay awake long enough to answer questions.  I think this is less likely acute metabolic encephalopathy and more likely the fact that it is the middle of the night.  In any event adding on VBG and ammonia to this morning's labs.  In the ED patient's wife had told them that the patient had had a fever of 101 at home.  They decided to come to the hospital after a dose of Tylenol did not relieve the fever.  Patient had been in the oncology clinic yesterday.  History of iron deficiency anemia was transfused secondary to hemoglobin of 6.7.  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  He has had history of GI bleed recently in September.  He had a apparently complained of continued melena at the heme-onc clinic as well.  Patient has been both DNR and full code in past hospitalizations.  On his most recent H&P he was listed as full code so we will continue with that. Review of Systems: unable to review all systems due to the inability of the patient to answer questions. Past Medical History:  Diagnosis Date   A-fib [facility])    when initially stating dialysis january 2021 at [facility] per spouse; never heard anything else about it   Alcoholic cirrhosis [facility])    patient reports completing hep a and b vaccines in 2006   Anemia    has had 3 units of prbcs 2013   Anxiety    Arthritis    Asymptomatic gallstones    Ultrasound in 2006   B12 deficiency  11/21/2020   C. difficile colitis    Cholelithiasis    Chronic kidney disease    Dialysis M/W/F/Sa   Depression    Diabetes ([place])    Duodenal ulcer with hemorrhage    per patient in 2006 or 2007, records have been requested   Dyspnea    low oxygen level -  has O2 2 L all day   Esophageal varices ([place])    see PSH   GERD (gastroesophageal reflux disease)    Heart burn    History of alcohol abuse    quit 05/2013   HOH (hard of hearing)    Hypertension    Liver cirrhosis ([place])    ETOH   SBP (spontaneous bacterial peritonitis) ([place])    2020, November 2021.    Splenomegaly    Ultrasound in 2006   Past Surgical History:  Procedure Laterality Date   AGILE CAPSULE N/A 09/20/2019   Procedure: AGILE CAPSULE;  Surgeon: Daneil Dolin, MD;  [facility];  Service: Endoscopy;  Laterality: N/A;   APPENDECTOMY     AV FISTULA PLACEMENT Left 08/17/2019   Procedure: ARTERIOVENOUS (AV) FISTULA CREATION VERSES ARTERIOVENOUS GRAFT;  Surgeon: [name], MD;  Location: [facility] OR;  Service: Vascular;  Laterality: Left;   BASCILIC VEIN TRANSPOSITION Left 09/28/2019   Procedure: LEFT SECOND STAGE [place];  Surgeon: [name], MD;  Location:  [place] OR;  Service: Vascular;  Laterality: Left;   BIOPSY  02/03/2018   Procedure: BIOPSY;  Surgeon: Daneil Dolin, MD;  [facility];  Service: Endoscopy;;  bx of gastric polyps   BIOPSY  09/19/2019   Procedure: BIOPSY;  Surgeon: Danie Binder, MD;  [facility];  Service: Endoscopy;;   BIOPSY  01/16/2021   Procedure: BIOPSY;  Surgeon: Daneil Dolin, MD;  [facility];  Service: Endoscopy;;   CATARACT EXTRACTION Right    COLONOSCOPY  03/10/2005   Rectal polyp as described above, removed with snare. Left sided  diverticula. The remainder of the colonic mucosa appeared normal. Inflammed polyp on path.   COLONOSCOPY  04/1999   Dr. Thea Silversmith polyps removed,  Path showed hyperplastic    COLONOSCOPY  10/2015   Dr. Britta Mccreedy: diverticulosis, single sessile tubular adenoma 3-46m in size removed from descending colon.    COLONOSCOPY WITH ESOPHAGOGASTRODUODENOSCOPY (EGD)  03/31/2012   RTIR:[id number]. Colonic diverticulosis. tubular adenoma colon   COLONOSCOPY WITH ESOPHAGOGASTRODUODENOSCOPY (EGD)  06/2019   FORSYTH: small esophageal varices without high risk stigmata, single large AVM on lesser curvature in gastric body s/p APC ablation, gastric antral and duodenal bulb polyposis. No significant source to explain transfusion dependent anemia. Colonoscopy with portal colopathy and diffuse edema, changes of Cdiff colitis on colonoscopy   COLONOSCOPY WITH PROPOFOL N/A 01/19/2021   Surgeon: CDaryel November MD; 15 mm polyp in descending colon removed piecemeal, 6 mm polyp in ascending colon and 12 mm polyp in sigmoid colon not removed due to high bleeding risk, benign polypoid lesion at splenic flexure, diverticulosis in sigmoid and descending colon, rectal varices.   COLONOSCOPY WITH PROPOFOL N/A 01/16/2021   Surgeon: RDaneil Dolin MD;  Portal colopathy, large rectal varices, polypoid mass at splenic flexure biopsy, two 1 cm left colon polyps not removed, left-sided diverticulosis.  Reported patient had bleeding from splenic flexure lesion.  Pathology with acutely inflamed colonic mucosa with localized ulceration and prominent reactive changes.   ESOPHAGEAL BANDING N/A 02/03/2018   Procedure: ESOPHAGEAL BANDING;  Surgeon: RDaneil Dolin MD;  [facility];  Service: Endoscopy;  Laterality: N/A;   ESOPHAGEAL BANDING  01/16/2021   Procedure: ESOPHAGEAL BANDING;  Surgeon: RDaneil Dolin MD;  [facility];  Service: Endoscopy;;   ESOPHAGOGASTRODUODENOSCOPY  01/15/2005   Three columns grade 1 to 2 esophageal varices, otherwise normal esophageal mucosa.  Esophagus was not manipulated otherwise./Nodularity of the antrum with overlying erosions, nonspecific finding.  Path showed rare H.pylori   ESOPHAGOGASTRODUODENOSCOPY  09/2008   Dr. RGaylene Brookscolumns of grade 2-3 esoph varices, only one column was prominent. Portal gastropathy, multiple gastrc polyps at antrum, two were 2cm with black eschar, bulbar polyps, bulbar erosions   ESOPHAGOGASTRODUODENOSCOPY  11/2004   Dr. FBrantley Stage3 esoph varices   ESOPHAGOGASTRODUODENOSCOPY  03/31/2012   RMR: 4 columns(3-GR 2, 1-GR1) non-bleeding esophageal varices, portal gastropathy, small HH, early GAVE, multiple gastric polyps    ESOPHAGOGASTRODUODENOSCOPY (EGD) WITH PROPOFOL N/A 02/03/2018   Dr. RGala Romney Esophageal varices, 3 columns grade 1-2.  Portal hypertensive gastropathy.  Multiple gastric polyps, biopsy consistent with hyperplastic.   ESOPHAGOGASTRODUODENOSCOPY (EGD) WITH PROPOFOL N/A 09/19/2019   Fields: grade I and II esophageal varcies, mild portal hypertensive gastropathy, moderate gastritis but no H. pylori, single hyperplastic gastric polyp removed, obvious source for melena/transfusion dependent anemia not identified, may be due to friable gastric and duodenal mucosa in the setting of portal hypertension   ESOPHAGOGASTRODUODENOSCOPY (EGD)  WITH PROPOFOL N/A 01/16/2021   Surgeon: Daneil Dolin, MD; Portal hypertensive gastropathy, grade 2 esophageal varices with bleeding stigmata s/p banding x4, slight nodularity and congestion involving bulb and second portion of duodenum, nonspecific.  Recommended repeat EGD in 4 weeks.   ESOPHAGOGASTRODUODENOSCOPY (EGD) WITH PROPOFOL N/A 05/22/2021   stigmata of prior esophageal banding and no significant varices seen. Portal gastropathy. Polypoid gastric and duodenal mucosa with overlying erosions, marked mucosal friability.   ESOPHAGOGASTRODUODENOSCOPY (EGD) WITH PROPOFOL N/A 02/06/2022   Procedure: ESOPHAGOGASTRODUODENOSCOPY (EGD) WITH PROPOFOL;  Surgeon: Harvel Quale, MD;  [facility];  Service: Gastroenterology;  Laterality: N/A;    GASTRIC VARICES BANDING  03/31/2012   Procedure: GASTRIC VARICES BANDING;  Surgeon: Daneil Dolin, MD;  [facility];  Service: Endoscopy;  Laterality: N/A;   GIVENS CAPSULE STUDY N/A 09/21/2019   Poor study with a lot of debris obstructing much of the view of the first approximate 3 hours out of 6 hours.  No obvious source of bleeding identified.  Sequela of bleeding and old blood seen in the form of"whisps" of blood, blood flecks mostly toward the end of the study.   HEMOSTASIS CLIP PLACEMENT  01/19/2021   Procedure: HEMOSTASIS CLIP PLACEMENT;  Surgeon: [name], MD;  [facility] END[facility];  Service: Gastroenterology;;   HEMOSTASIS CLIP PLACEMENT  02/06/2022   Procedure: HEMOSTASIS CLIP PLACEMENT;  Surgeon: Montez Morita, Quillian Quince, MD;  [facility];  Service: Gastroenterology;;  gastric body   HOT HEMOSTASIS  02/06/2022   Procedure: HOT HEMOSTASIS (ARGON PLASMA COAGULATION/BICAP);  Surgeon: Montez Morita, Quillian Quince, MD;  [facility];  Service: Gastroenterology;;   IR REMOVAL TUN CV CATH W/O [facility]  02/15/2020   POLYPECTOMY  09/19/2019   Procedure: POLYPECTOMY;  Surgeon: Danie Binder, MD;  [facility];  Service: Endoscopy;;   POLYPECTOMY  01/19/2021   Procedure: POLYPECTOMY;  Surgeon: [name], MD;  Location: [facility] ENDOSCOPY;  Service: Gastroenterology;;   UMBILICAL HERNIA REPAIR  2017   exploratory laparotomy, abdominal washout   Social History:  reports that he quit smoking about 36 years ago. His smoking use included cigarettes. He has a 50.00 pack-year smoking history. He has never used smokeless tobacco. He reports that he does not drink alcohol and does not use drugs.  Allergies  Allergen Reactions   Tape Itching, Rash and Other (See Comments)    Tears skin    Family History  Problem Relation Age of Onset   Colon cancer Father 20       deceased age 32   Breast cancer Sister        deceased   Diabetes Sister     Stroke Mother    Healthy Son    Healthy Daughter    Lung cancer Neg Hx    Ovarian cancer Neg Hx     Prior to Admission medications   Medication Sig Start Date End Date Taking? Authorizing Provider  acetaminophen (TYLENOL) 650 MG CR tablet Take 1,300 mg by mouth every 8 (eight) hours as needed for pain.   Yes [provider]  Acidophilus Lactobacillus CAPS Take 1 tablet by mouth daily.   Yes [provider]  Alcohol Swabs (ALCOHOL PREP) 70 % PADS Apply 1 each topically 3 (three) times daily. 01/31/20  Yes [provider]  allopurinol (ZYLOPRIM) 300 MG tablet Take 300 mg by mouth daily. 12/11/21  Yes [provider]  ALPRAZolam Duanne Moron) 0.5 MG tablet Take 1 tablet (  0.5 mg total) by mouth See admin instructions. Take one tablet (0.5 mg) by mouth on Monday, Wednesday, Friday before dialysis, may also take one tablet (0.5 mg) daily as needed for anxiety. 02/07/22  Yes Tat, Shanon Brow, MD  Ascorbic Acid (VITAMIN C) 1000 MG tablet Take 1,000 mg by mouth in the morning.   Yes [provider]  calcium carbonate (TUMS - DOSED IN MG ELEMENTAL CALCIUM) 500 MG chewable tablet Chew 1 tablet by mouth 3 (three) times daily with meals.   Yes [provider]  cetirizine (ZYRTEC) 10 MG tablet Take 10 mg by mouth daily. 06/24/21  Yes [provider]  cholecalciferol (VITAMIN D3) 25 MCG (1000 UNIT) tablet Take 1,000 Units by mouth daily.   Yes [provider]  diltiazem (TIAZAC) 120 MG 24 hr capsule Take 120 mg by mouth daily. 02/17/22  Yes [provider]  famotidine (PEPCID) 20 MG tablet Take 1 tablet (20 mg total) by mouth 2 (two) times daily. 07/30/21 07/30/22 Yes Barton Dubois, MD  furosemide (LASIX) 40 MG tablet Take 80 mg by mouth daily. 12/29/19  Yes [provider]  GLUCAGEN HYPOKIT 1 MG SOLR injection Inject 1 mg into the muscle once as needed for low blood sugar. 11/28/20  Yes [provider]  HUMALOG KWIKPEN 100 UNIT/ML  KwikPen 10-50 Units 3 (three) times daily. Sliding scale 04/23/21  Yes [provider]  hydrOXYzine (ATARAX/VISTARIL) 25 MG tablet Take 25 mg by mouth 3 (three) times daily. For itching 11/23/20  Yes [provider]  iron polysaccharides (NIFEREX) 150 MG capsule Take 150 mg by mouth daily with supper.   Yes [provider]  lactulose (CHRONULAC) 10 GM/15ML solution Take 15 mLs (10 g total) by mouth 2 (two) times daily. 02/07/22  Yes Tat, Shanon Brow, MD  metoprolol succinate (TOPROL-XL) 25 MG 24 hr tablet Take 1 tablet (25 mg total) by mouth daily. 02/22/22  Yes Tat, Shanon Brow, MD  mirtazapine (REMERON) 30 MG tablet Take 1 tablet (30 mg total) by mouth at bedtime. 02/10/20  Yes Roxan Hockey, MD  Multiple Vitamin (MULTIVITAMIN WITH MINERALS) TABS tablet Take 1 tablet by mouth in the morning.    Yes [provider]  omega-3 acid ethyl esters (LOVAZA) 1 g capsule Take 2 g by mouth 2 (two) times daily.  04/07/19  Yes [provider]  ondansetron (ZOFRAN) 4 MG tablet Take 4 mg by mouth every 8 (eight) hours as needed for nausea or vomiting.   Yes [provider]  pantoprazole (PROTONIX) 40 MG tablet Take 1 tablet (40 mg total) by mouth 2 (two) times daily. 02/21/22  Yes Tat, Shanon Brow, MD  pravastatin (PRAVACHOL) 20 MG tablet Take 20 mg by mouth in the morning.    Yes [provider]  SSD 1 % cream Apply 2 Applications topically 2 (two) times daily. 02/26/22  Yes [provider]  sulfamethoxazole-trimethoprim (BACTRIM DS) 800-160 MG tablet TAKE 1 TABLET BY MOUTH every friday AFTER dialysis 03/12/22  Yes Annitta Needs, NP  thiamine (VITAMIN B-1) 100 MG tablet Take 100 mg by mouth daily. 08/27/20  Yes [provider]  tiZANidine (ZANAFLEX) 2 MG tablet Take 2 mg by mouth 2 (two) times daily as needed. 04/07/22  Yes [provider]  TRESIBA FLEXTOUCH 200 UNIT/ML FlexTouch Pen Inject 10-120 Units into the skin daily. Sliding scale 02/17/22   Yes [provider]  triamcinolone cream (KENALOG) 0.1 % Apply 1 application topically 2 (two) times daily as needed (dry skin Itching).  12/30/17  Yes [provider]  venlafaxine (EFFEXOR) 75 MG tablet Take 75 mg by mouth 3 (three) times daily with meals.    Yes [provider]  XIFAXAN 550 MG TABS tablet TAKE 1 TABLET BY MOUTH TWICE DAILY 02/12/22  Yes Annitta Needs, NP  BD PEN NEEDLE MICRO U/F 32G X 6 MM MISC SMARTSIG:SUB-Q Daily PRN 04/23/21   [provider]  colchicine 0.6 MG tablet Take 0.6 mg by mouth 2 (two) times daily. Patient not taking: Reported on 04/22/2022 02/26/22   [provider]  Continuous Blood Gluc Sensor (FREESTYLE LIBRE 2 SENSOR) MISC Apply topically. 10/15/21   [provider]    Physical Exam: Vitals:   04/22/22 2344 04/22/22 2346 04/23/22 0000 04/23/22 0100  BP:   (!) 132/57 (!) 103/35  Pulse: 86  85 80  Resp: (!) '28  20 16  '$ Temp:  98.7 F (37.1 C)    TempSrc:  Rectal    SpO2: (!) 75%  95% 100%  Weight: 81 kg     Height: '5\' 7"'$  (1.702 m)      1.  General: Patient lying supine in bed,  no acute distress   2. Psychiatric: Hypersomnolent unable to participate in exam   3. Neurologic: Hypersomnolent, unable to participate in exam, face is symmetric,     4. HEENMT:  Head is atraumatic, normocephalic, pupils reactive to light, neck is supple, trachea is midline, mucous membranes are moist   5. Respiratory : Lungs are clear to auscultation bilaterally without wheezing, rhonchi, rales, no cyanosis, no increase in work of breathing or accessory muscle use   6. Cardiovascular : Heart rate normal, rhythm is regular, murmur present rubs or gallops, no peripheral edema, peripheral pulses palpated   7. Gastrointestinal:  Abdomen is soft, nondistended, nontender to palpation bowel sounds active, no masses or organomegaly palpated   8. Skin:  Skin is warm, dry and intact, venous stasis changes in the bilateral  lower extremities   9.Musculoskeletal:  No acute deformities or trauma, no asymmetry in tone, no peripheral edema, peripheral pulses palpated, no tenderness to palpation in the extremities  Data Reviewed: In the ED - Temp 98.6-103, heart rate 90-95, respiratory rate 20-27, blood pressure 116/52-121/54, maintaining oxygen sats 2 L nasal cannula Apparently, when patient was transported to the unit he did not have his oxygen on and when he arrived his O2 sats were in the 65s.  Patient was put on 10 L nasal cannula, and slowly wean down Patient is leukopenic with a white blood cell count of 2.6, hemoglobin is stable at 8.0 Chemistry reveals a creatinine of 2.05 and is hemodialysis patient Potassium is low at 2.8, conservative replacement tonight and then defer to nephro Lactic acid 4.6, 3.4, 3.0>> likely going to take time to clear in this ESRD and end-stage cirrhosis patient COVID and flu negative Blood culture negative Strep negative Chest x-ray shows focal consolidation in the right midlung Patient was started on cefepime and Rocephin Admission requested for sepsis secondary to Community-acquired pneumonia in neutropenic patient   Assessment and Plan: * Sepsis due to pneumonia ([place]) - Febrile up to 103, tacky up to 95, tachypneic up to 27, 2.6, lactic acid 4.6 - Chest x-ray shows focal consolidation in right midlung likely pneumonia - Given patient's neutropenia he was started on broad-spectrum coverage with vancomycin and cefepime - Expectorated sputum culture pending - Blood culture pending - Urine antigens pending - Patient received 2.5 L bolus in the ED -  Continue LR at a reduced rate given his ESRD - Continue to monitor  ESRD on dialysis [facility]) - Monday Wednesday Friday HD schedule - Last dialyzed 11/22 - Consult nephrology - Monitor    Chronic respiratory failure with hypoxia (HCC) - On 2 L nasal cannula at baseline - Currently maintaining oxygen sats on the same -  Continue albuterol for wheezing or shortness of breath - Continue to monitor  Gastroesophageal reflux disease - Continue PPI and Pepcid  Controlled type 2 diabetes mellitus with chronic kidney disease on chronic dialysis, with long-term current use of insulin ([place]) - Home Tyler Aas is on a huge variation sliding scale from 10-120 units - Glucose has been well controlled here, will order basal insulin of 5 units - Sliding scale coverage - Low threshold to titrate insulin up since he has been used to much higher doses at home apparently - Hemoglobin A1c in the a.m. - Continue to monitor  Alcoholic cirrhosis (HCC) - Continue lactulose - Check ammonia - Continue rifaximin - Continue to monitor  Essential hypertension, benign - Given new pressures in the 110's- 120s, holding diltiazem home dose - Continue metoprolol - Continue to monitor      Advance Care Planning:   Code Status: Full Code  Consults: Nephrology  Family Communication: No family at bedside  Severity of Illness: The appropriate patient status for this patient is INPATIENT. Inpatient status is judged to be reasonable and necessary in order to provide the required intensity of service to ensure the patient's safety. The patient's presenting symptoms, physical exam findings, and initial radiographic and laboratory data in the context of their chronic comorbidities is felt to place them at high risk for further clinical deterioration. Furthermore, it is not anticipated that the patient will be medically stable for discharge from the hospital within 2 midnights of admission.   * I certify that at the point of admission it is my clinical judgment that the patient will require inpatient hospital care spanning beyond 2 midnights from the point of admission due to high intensity of service, high risk for further deterioration and high frequency of surveillance required.*  Author: Rolla Plate, DO 04/23/2022 1:58  AM  For on call review [email or web address].

## 2022-04-23 NOTE — Consult Note (Signed)
Reason for Consult: To manage dialysis and dialysis related needs Referring Physician: Andrius Andrepont is an 76 y.o. male with DM, HTN, cirrhosis with varices, atrial fibrillation, pancytopenia and history of GIB and ESRD-  [facility].  He reportedly got HD yesterday.  He presented to the ER yesterday with fever after HD but also decreased responsiveness.  Found to have a hgb of 6.7 and also  WBC of 3.5 and GNR in blood cultures given blood as well as cefrpime and vanc.  We are asked to provide his routine HD.  He is alert but HOH-  knew he was in the hospital but saying some non sensical things-  wife at bedside.  BP is soft and he is being given IVF   HD orders from past admit   [facility] MWF  4 hours  EDW 79  ( is old value). HD [place] 2/2.5, Dialyzer unknown, Heparin no. Access L UE AVF.  Past Medical History:  Diagnosis Date   A-fib [facility])    when initially stating dialysis january 2021 at [facility] per spouse; never heard anything else about it   Alcoholic cirrhosis [facility])    patient reports completing hep a and b vaccines in 2006   Anemia    has had 3 units of prbcs 2013   Anxiety    Arthritis    Asymptomatic gallstones    Ultrasound in 2006   B12 deficiency 11/21/2020   C. difficile colitis    Cholelithiasis    Chronic kidney disease    Dialysis M/W/F/Sa   Depression    Diabetes ([place])    Duodenal ulcer with hemorrhage    per patient in 2006 or 2007, records have been requested   Dyspnea    low oxygen level -  has O2 2 L all day   Esophageal varices ([place])    see [facility]   GERD (gastroesophageal reflux disease)    Heart burn    History of alcohol abuse    quit 05/2013   HOH (hard of hearing)    Hypertension    Liver cirrhosis ([place])    ETOH   SBP (spontaneous bacterial peritonitis) ([place])    2020, November 2021.    Splenomegaly    Ultrasound in 2006    Past Surgical History:  Procedure Laterality Date   AGILE CAPSULE N/A 09/20/2019   Procedure: AGILE  CAPSULE;  Surgeon: Daneil Dolin, MD;  [facility];  Service: Endoscopy;  Laterality: N/A;   APPENDECTOMY     AV FISTULA PLACEMENT Left 08/17/2019   Procedure: ARTERIOVENOUS (AV) FISTULA CREATION VERSES ARTERIOVENOUS GRAFT;  Surgeon: [name], MD;  Location: [facility] OR;  Service: Vascular;  Laterality: Left;   BASCILIC VEIN TRANSPOSITION Left 09/28/2019   Procedure: LEFT SECOND STAGE [place];  Surgeon: [name], MD;  Location: [place];  Service: Vascular;  Laterality: Left;   BIOPSY  02/03/2018   Procedure: BIOPSY;  Surgeon: Daneil Dolin, MD;  [facility];  Service: Endoscopy;;  bx of gastric polyps   BIOPSY  09/19/2019   Procedure: BIOPSY;  Surgeon: Danie Binder, MD;  [facility];  Service: Endoscopy;;   BIOPSY  01/16/2021   Procedure: BIOPSY;  Surgeon: Daneil Dolin, MD;  [facility];  Service: Endoscopy;;   CATARACT EXTRACTION Right    COLONOSCOPY  03/10/2005   Rectal polyp as described above, removed with snare. Left sided  diverticula. The remainder  of the colonic mucosa appeared normal. Inflammed polyp on path.   COLONOSCOPY  04/1999   Dr. Thea Silversmith polyps removed,  Path showed hyperplastic   COLONOSCOPY  10/2015   Dr. Britta Mccreedy: diverticulosis, single sessile tubular adenoma 3-46m in size removed from descending colon.    COLONOSCOPY WITH ESOPHAGOGASTRODUODENOSCOPY (EGD)  03/31/2012   RBEM:[id number]. Colonic diverticulosis. tubular adenoma colon   COLONOSCOPY WITH ESOPHAGOGASTRODUODENOSCOPY (EGD)  06/2019   FORSYTH: small esophageal varices without high risk stigmata, single large AVM on lesser curvature in gastric body s/p APC ablation, gastric antral and duodenal bulb polyposis. No significant source to explain transfusion dependent anemia. Colonoscopy with portal colopathy and diffuse edema, changes of Cdiff colitis on colonoscopy   COLONOSCOPY WITH PROPOFOL N/A 01/19/2021   Surgeon: CDaryel November MD; 15 mm polyp in descending colon removed piecemeal, 6 mm polyp in ascending colon and 12 mm polyp in sigmoid colon not removed due to high bleeding risk, benign polypoid lesion at splenic flexure, diverticulosis in sigmoid and descending colon, rectal varices.   COLONOSCOPY WITH PROPOFOL N/A 01/16/2021   Surgeon: RDaneil Dolin MD;  Portal colopathy, large rectal varices, polypoid mass at splenic flexure biopsy, two 1 cm left colon polyps not removed, left-sided diverticulosis.  Reported patient had bleeding from splenic flexure lesion.  Pathology with acutely inflamed colonic mucosa with localized ulceration and prominent reactive changes.   ESOPHAGEAL BANDING N/A 02/03/2018   Procedure: ESOPHAGEAL BANDING;  Surgeon: RDaneil Dolin MD;  [facility];  Service: Endoscopy;  Laterality: N/A;   ESOPHAGEAL BANDING  01/16/2021   Procedure: ESOPHAGEAL BANDING;  Surgeon: RDaneil Dolin MD;  [facility];  Service: Endoscopy;;   ESOPHAGOGASTRODUODENOSCOPY  01/15/2005   Three columns grade 1 to 2 esophageal varices, otherwise normal esophageal mucosa.  Esophagus was not manipulated otherwise./Nodularity of the antrum with overlying erosions, nonspecific finding. Path showed rare H.pylori   ESOPHAGOGASTRODUODENOSCOPY  09/2008   Dr. RGaylene Brookscolumns of grade 2-3 esoph varices, only one column was prominent. Portal gastropathy, multiple gastrc polyps at antrum, two were 2cm with black eschar, bulbar polyps, bulbar erosions   ESOPHAGOGASTRODUODENOSCOPY  11/2004   Dr. FBrantley Stage3 esoph varices   ESOPHAGOGASTRODUODENOSCOPY  03/31/2012   RMR: 4 columns(3-GR 2, 1-GR1) non-bleeding esophageal varices, portal gastropathy, small HH, early GAVE, multiple gastric polyps    ESOPHAGOGASTRODUODENOSCOPY (EGD) WITH PROPOFOL N/A 02/03/2018   Dr. RGala Romney Esophageal varices, 3 columns grade 1-2.  Portal hypertensive gastropathy.  Multiple gastric polyps, biopsy consistent with  hyperplastic.   ESOPHAGOGASTRODUODENOSCOPY (EGD) WITH PROPOFOL N/A 09/19/2019   Fields: grade I and II esophageal varcies, mild portal hypertensive gastropathy, moderate gastritis but no H. pylori, single hyperplastic gastric polyp removed, obvious source for melena/transfusion dependent anemia not identified, may be due to friable gastric and duodenal mucosa in the setting of portal hypertension   ESOPHAGOGASTRODUODENOSCOPY (EGD) WITH PROPOFOL N/A 01/16/2021   Surgeon: RDaneil Dolin MD; Portal hypertensive gastropathy, grade 2 esophageal varices with bleeding stigmata s/p banding x4, slight nodularity and congestion involving bulb and second portion of duodenum, nonspecific.  Recommended repeat EGD in 4 weeks.   ESOPHAGOGASTRODUODENOSCOPY (EGD) WITH PROPOFOL N/A 05/22/2021   stigmata of prior esophageal banding and no significant varices seen. Portal gastropathy. Polypoid gastric and duodenal mucosa with overlying erosions, marked mucosal friability.   ESOPHAGOGASTRODUODENOSCOPY (EGD) WITH PROPOFOL N/A 02/06/2022   Procedure: ESOPHAGOGASTRODUODENOSCOPY (EGD) WITH PROPOFOL;  Surgeon: CHarvel Quale MD;  [facility];  Service: Gastroenterology;  Laterality: N/A;  GASTRIC VARICES BANDING  03/31/2012   Procedure: GASTRIC VARICES BANDING;  Surgeon: Daneil Dolin, MD;  [facility];  Service: Endoscopy;  Laterality: N/A;   GIVENS CAPSULE STUDY N/A 09/21/2019   Poor study with a lot of debris obstructing much of the view of the first approximate 3 hours out of 6 hours.  No obvious source of bleeding identified.  Sequela of bleeding and old blood seen in the form of"whisps" of blood, blood flecks mostly toward the end of the study.   HEMOSTASIS CLIP PLACEMENT  01/19/2021   Procedure: HEMOSTASIS CLIP PLACEMENT;  Surgeon: [name], MD;  Location: [facility] ENDOSCOPY;  Service: Gastroenterology;;   HEMOSTASIS CLIP PLACEMENT  02/06/2022   Procedure: HEMOSTASIS CLIP  PLACEMENT;  Surgeon: Montez Morita, Quillian Quince, MD;  [facility];  Service: Gastroenterology;;  gastric body   HOT HEMOSTASIS  02/06/2022   Procedure: HOT HEMOSTASIS (ARGON PLASMA COAGULATION/BICAP);  Surgeon: Montez Morita, Quillian Quince, MD;  [facility];  Service: Gastroenterology;;   IR REMOVAL TUN CV CATH W/O [facility]  02/15/2020   POLYPECTOMY  09/19/2019   Procedure: POLYPECTOMY;  Surgeon: Danie Binder, MD;  [facility];  Service: Endoscopy;;   POLYPECTOMY  01/19/2021   Procedure: POLYPECTOMY;  Surgeon: [name], MD;  Location: [facility] ENDOSCOPY;  Service: Gastroenterology;;   UMBILICAL HERNIA REPAIR  2017   exploratory laparotomy, abdominal washout    Family History  Problem Relation Age of Onset   Colon cancer Father 20       deceased age 75   Breast cancer Sister        deceased   Diabetes Sister    Stroke Mother    Healthy Son    Healthy Daughter    Lung cancer Neg Hx    Ovarian cancer Neg Hx     Social History:  reports that he quit smoking about 36 years ago. His smoking use included cigarettes. He has a 50.00 pack-year smoking history. He has never used smokeless tobacco. He reports that he does not drink alcohol and does not use drugs.  Allergies:  Allergies  Allergen Reactions   Tape Itching, Rash and Other (See Comments)    Tears skin    Medications: I have reviewed the patient's current medications.  Results for orders placed or performed during the hospital encounter of 04/22/22 (from the past 48 hour(s))  Lactic acid, plasma     Status: Abnormal   Collection Time: 04/22/22  7:40 PM  Result Value Ref Range   Lactic Acid, Venous 4.6 (HH) 0.5 - 1.9 mmol/L    Comment: CRITICAL RESULT CALLED TO, READ BACK BY AND VERIFIED WITH: FENSKE,P ON 04/22/22 AT 2040 BY LOY,C Performed at [facility], [address], [place], [place] [address]   Comprehensive metabolic panel     Status: Abnormal   Collection Time: 04/22/22  7:40 PM   Result Value Ref Range   Sodium 132 (L) 135 - 145 mmol/L   Potassium 2.8 (L) 3.5 - 5.1 mmol/L   Chloride 92 (L) 98 - 111 mmol/L   CO2 25 22 - 32 mmol/L   Glucose, Bld 129 (H) 70 - 99 mg/dL    Comment: Glucose reference range applies only to samples taken after fasting for at least 8 hours.   BUN 20 8 - 23 mg/dL   Creatinine, Ser 2.05 (H) 0.61 - 1.24 mg/dL   Calcium 8.1 (L) 8.9 - 10.3 mg/dL   Total Protein 6.3 (L)  6.5 - 8.1 g/dL   Albumin 2.4 (L) 3.5 - 5.0 g/dL   AST 43 (H) 15 - 41 U/L   ALT 27 0 - 44 U/L   Alkaline Phosphatase 254 (H) 38 - 126 U/L   Total Bilirubin 1.5 (H) 0.3 - 1.2 mg/dL   GFR, Estimated 33 (L) >60 mL/min    Comment: (NOTE) Calculated using the CKD-EPI Creatinine Equation (2021)    Anion gap 15 5 - 15    Comment: Performed at [facility], [address] [place], [place] [address]  CBC with Differential     Status: Abnormal   Collection Time: 04/22/22  7:40 PM  Result Value Ref Range   WBC 2.6 (L) 4.0 - 10.5 K/uL   RBC 2.47 (L) 4.22 - 5.81 MIL/uL   Hemoglobin 8.0 (L) 13.0 - 17.0 g/dL    Comment: POST TRANSFUSION SPECIMEN DLTA    HCT 25.0 (L) 39.0 - 52.0 %   MCV 101.2 (H) 80.0 - 100.0 fL   MCH 32.4 26.0 - 34.0 pg   MCHC 32.0 30.0 - 36.0 g/dL   RDW 22.3 (H) 11.5 - 15.5 %   Platelets 117 (L) 150 - 400 K/uL    Comment: SPECIMEN CHECKED FOR CLOTS REPEATED TO VERIFY PLATELET COUNT CONFIRMED BY SMEAR    nRBC 1.2 (H) 0.0 - 0.2 %   Neutrophils Relative % 97 %   Neutro Abs 2.5 1.7 - 7.7 K/uL   Lymphocytes Relative 1 %   Lymphs Abs 0.0 (L) 0.7 - 4.0 K/uL   Monocytes Relative 1 %   Monocytes Absolute 0.0 (L) 0.1 - 1.0 K/uL   Eosinophils Relative 1 %   Eosinophils Absolute 0.0 0.0 - 0.5 K/uL   Basophils Relative 0 %   Basophils Absolute 0.0 0.0 - 0.1 K/uL   WBC Morphology MORPHOLOGY UNREMARKABLE    RBC Morphology See Note     Comment: ANISOCYTOSIS   Smear Review PLATELET COUNT CONFIRMED BY SMEAR    Immature Granulocytes 0 %   Abs Immature Granulocytes  0.01 0.00 - 0.07 K/uL    Comment: Performed at [facility], [address], [place], [place] [address]  Protime-INR     Status: Abnormal   Collection Time: 04/22/22  7:40 PM  Result Value Ref Range   Prothrombin Time 16.0 (H) 11.4 - 15.2 seconds   INR 1.3 (H) 0.8 - 1.2    Comment: (NOTE) INR goal varies based on device and disease states. Performed at [facility], [address], [place], [place] [address]   APTT     Status: Abnormal   Collection Time: 04/22/22  7:40 PM  Result Value Ref Range   aPTT 40 (H) 24 - 36 seconds    Comment:        IF BASELINE aPTT IS ELEVATED, SUGGEST PATIENT RISK ASSESSMENT BE USED TO DETERMINE APPROPRIATE ANTICOAGULANT THERAPY. Performed at [facility], [address] [place], [place] [address]   Blood Culture (routine x 2)     Status: None (Preliminary result)   Collection Time: 04/22/22  7:40 PM   Specimen: BLOOD RIGHT ARM  Result Value Ref Range   Specimen Description BLOOD RIGHT ARM    Special Requests      BOTTLES DRAWN AEROBIC AND ANAEROBIC Blood Culture results may not be optimal due to an excessive volume of blood received in culture bottles   Culture  Setup Time      GRAM NEGATIVE RODS ANAEROBIC BOTTLE Gram Stain Report Called to,Read Back By  and Verified With: ESPOSE,R'@0715'$  BY MATTHEWS, B 11.23.23   Culture      NO GROWTH < 12 HOURS Performed at [facility], [address], [place], [place] [address]    Report Status PENDING   Blood Culture (routine x 2)     Status: None (Preliminary result)   Collection Time: 04/22/22  8:05 PM   Specimen: BLOOD  Result Value Ref Range   Specimen Description BLOOD BLOOD RIGHT HAND    Special Requests      BOTTLES DRAWN AEROBIC AND ANAEROBIC Blood Culture adequate volume   Culture  Setup Time      GRAM NEGATIVE RODS ANAEROBIC BOTTLE Gram Stain Report Called to,Read Back By and Verified With: ESPOSE,R'@0718'$  BY MATTHEWS, B 11.23.23 GRAM NEGATIVE RODS AEROBIC BOTTLE ONLY Gram Stain Report Called to,Read  Back By and Verified With: TINA ISAACS,RN 04/23/22 '@0904'$  BY SSLANE     Culture      NO GROWTH < 12 HOURS Performed at [facility], [address], [place], [place] [address]    Report Status PENDING   Resp Panel by RT-PCR (Flu A&B, Covid) Anterior Nasal Swab     Status: None   Collection Time: 04/22/22  8:51 PM   Specimen: Anterior Nasal Swab  Result Value Ref Range   SARS Coronavirus 2 by RT PCR NEGATIVE NEGATIVE    Comment: (NOTE) SARS-CoV-2 target nucleic acids are NOT DETECTED.  The SARS-CoV-2 RNA is generally detectable in upper respiratory specimens during the acute phase of infection. The lowest concentration of SARS-CoV-2 viral copies this assay can detect is 138 copies/mL. A negative result does not preclude SARS-Cov-2 infection and should not be used as the sole basis for treatment or other patient management decisions. A negative result may occur with  improper specimen collection/handling, submission of specimen other than nasopharyngeal swab, presence of viral mutation(s) within the areas targeted by this assay, and inadequate number of viral copies(<138 copies/mL). A negative result must be combined with clinical observations, patient history, and epidemiological information. The expected result is Negative.  Fact Sheet for Patients:  EntrepreneurPulse.com.au  Fact Sheet for Healthcare Providers:  IncredibleEmployment.be  This test is no t yet approved or cleared by the [place] FDA and  has been authorized for detection and/or diagnosis of SARS-CoV-2 by FDA under an Emergency Use Authorization (EUA). This EUA will remain  in effect (meaning this test can be used) for the duration of the COVID-19 declaration under Section 564(b)(1) of the Act, 21 U.S.C.section 360bbb-3(b)(1), unless the authorization is terminated  or revoked sooner.       Influenza A by PCR NEGATIVE NEGATIVE   Influenza B by PCR NEGATIVE NEGATIVE     Comment: (NOTE) The Xpert Xpress SARS-CoV-2/FLU/RSV plus assay is intended as an aid in the diagnosis of influenza from Nasopharyngeal swab specimens and should not be used as a sole basis for treatment. Nasal washings and aspirates are unacceptable for Xpert Xpress SARS-CoV-2/FLU/RSV testing.  Fact Sheet for Patients: EntrepreneurPulse.com.au  Fact Sheet for Healthcare Providers: IncredibleEmployment.be  This test is not yet approved or cleared by the [place] FDA and has been authorized for detection and/or diagnosis of SARS-CoV-2 by FDA under an Emergency Use Authorization (EUA). This EUA will remain in effect (meaning this test can be used) for the duration of the COVID-19 declaration under Section 564(b)(1) of the Act, 21 U.S.C. section 360bbb-3(b)(1), unless the authorization is terminated or revoked.  Performed at [facility], [address], [place], [place] [address]  Group A Strep by PCR     Status: None   Collection Time: 04/22/22  9:08 PM   Specimen: Anterior Nasal Swab; Sterile Swab  Result Value Ref Range   Group A Strep by PCR NOT DETECTED NOT DETECTED    Comment: Performed at [facility], [address], [place], [place] [address]  Lactic acid, plasma     Status: Abnormal   Collection Time: 04/22/22  9:41 PM  Result Value Ref Range   Lactic Acid, Venous 3.4 (HH) 0.5 - 1.9 mmol/L    Comment: CRITICAL RESULT CALLED TO, READ BACK BY AND VERIFIED WITH: B. TEASLEY AT 2213 ON 11.22.23 BY ADGER J Performed at [facility], [address] [place], [place] [address]   MRSA Next Gen by PCR, Nasal     Status: None   Collection Time: 04/22/22 11:41 PM   Specimen: Nasal Mucosa; Nasal Swab  Result Value Ref Range   MRSA by PCR Next Gen NOT DETECTED NOT DETECTED    Comment: (NOTE) The GeneXpert MRSA Assay (FDA approved for NASAL specimens only), is one component of a comprehensive MRSA colonization surveillance program. It is not  intended to diagnose MRSA infection nor to guide or monitor treatment for MRSA infections. Test performance is not FDA approved in patients less than 46 years old. Performed at [facility], [address], [place], [place] [address]   Lactic acid, plasma     Status: Abnormal   Collection Time: 04/23/22 12:10 AM  Result Value Ref Range   Lactic Acid, Venous 3.0 (HH) 0.5 - 1.9 mmol/L    Comment: CRITICAL RESULT CALLED TO, READ BACK BY AND VERIFIED WITH: R.ESTOCE AT 0047 ON 11.23.23 BY ADGER J  Performed at [facility], [address], [place], [place] [address]   Protime-INR     Status: Abnormal   Collection Time: 04/23/22  2:39 AM  Result Value Ref Range   Prothrombin Time 18.2 (H) 11.4 - 15.2 seconds   INR 1.5 (H) 0.8 - 1.2    Comment: (NOTE) INR goal varies based on device and disease states. Performed at [facility], [address], [place], [place] [address]   Procalcitonin     Status: None   Collection Time: 04/23/22  2:39 AM  Result Value Ref Range   Procalcitonin 48.28 ng/mL    Comment:        Interpretation: PCT >= 10 ng/mL: Important systemic inflammatory response, almost exclusively due to severe bacterial sepsis or septic shock. (NOTE)       Sepsis PCT Algorithm           Lower Respiratory Tract                                      Infection PCT Algorithm    ----------------------------     ----------------------------         PCT < 0.25 ng/mL                PCT < 0.10 ng/mL          Strongly encourage             Strongly discourage   discontinuation of antibiotics    initiation of antibiotics    ----------------------------     -----------------------------       PCT 0.25 - 0.50 ng/mL            PCT 0.10 -  0.25 ng/mL               OR       >80% decrease in PCT            Discourage initiation of                                            antibiotics      Encourage discontinuation           of antibiotics    ----------------------------      -----------------------------         PCT >= 0.50 ng/mL              PCT 0.26 - 0.50 ng/mL                AND       <80% decrease in PCT             Encourage initiation of                                             antibiotics       Encourage continuation           of antibiotics    ----------------------------     -----------------------------        PCT >= 0.50 ng/mL                  PCT > 0.50 ng/mL               AND         increase in PCT                  Strongly encourage                                      initiation of antibiotics    Strongly encourage escalation           of antibiotics                                     -----------------------------                                           PCT <= 0.25 ng/mL                                                 OR                                        > 80% decrease in PCT  Discontinue / Do not initiate                                             antibiotics  Performed at [facility], [address] [place], [place] [address]   Comprehensive metabolic panel     Status: Abnormal   Collection Time: 04/23/22  2:39 AM  Result Value Ref Range   Sodium 132 (L) 135 - 145 mmol/L   Potassium 3.0 (L) 3.5 - 5.1 mmol/L   Chloride 98 98 - 111 mmol/L   CO2 23 22 - 32 mmol/L   Glucose, Bld 114 (H) 70 - 99 mg/dL    Comment: Glucose reference range applies only to samples taken after fasting for at least 8 hours.   BUN 23 8 - 23 mg/dL   Creatinine, Ser 2.23 (H) 0.61 - 1.24 mg/dL   Calcium 7.4 (L) 8.9 - 10.3 mg/dL   Total Protein 5.0 (L) 6.5 - 8.1 g/dL   Albumin 2.0 (L) 3.5 - 5.0 g/dL   AST 52 (H) 15 - 41 U/L   ALT 26 0 - 44 U/L   Alkaline Phosphatase 235 (H) 38 - 126 U/L   Total Bilirubin 1.0 0.3 - 1.2 mg/dL   GFR, Estimated 30 (L) >60 mL/min    Comment: (NOTE) Calculated using the CKD-EPI Creatinine Equation (2021)    Anion gap 11 5 - 15    Comment: Performed at [facility], [address], [place], [place] [address]  Magnesium     Status: None   Collection Time: 04/23/22  2:39 AM  Result Value Ref Range   Magnesium 1.8 1.7 - 2.4 mg/dL    Comment: Performed at [facility], [address] [place], [place] [address]  CBC with Differential/Platelet     Status: Abnormal   Collection Time: 04/23/22  2:39 AM  Result Value Ref Range   WBC 6.8 4.0 - 10.5 K/uL   RBC 2.12 (L) 4.22 - 5.81 MIL/uL   Hemoglobin 6.8 (LL) 13.0 - 17.0 g/dL    Comment: This critical result has verified and been called to TINAJERO,E by Lorette Ang on 11 23 2023 at 0326, and has been read back.    HCT 22.2 (L) 39.0 - 52.0 %   MCV 104.7 (H) 80.0 - 100.0 fL   MCH 32.1 26.0 - 34.0 pg   MCHC 30.6 30.0 - 36.0 g/dL   RDW 22.5 (H) 11.5 - 15.5 %   Platelets 79 (L) 150 - 400 K/uL    Comment: Immature Platelet Fraction may be clinically indicated, consider ordering this additional test [id number]    nRBC 0.0 0.0 - 0.2 %   Neutrophils Relative % 93 %   Neutro Abs 6.3 1.7 - 7.7 K/uL   Lymphocytes Relative 2 %   Lymphs Abs 0.1 (L) 0.7 - 4.0 K/uL   Monocytes Relative 4 %   Monocytes Absolute 0.3 0.1 - 1.0 K/uL   Eosinophils Relative 0 %   Eosinophils Absolute 0.0 0.0 - 0.5 K/uL   Basophils Relative 0 %   Basophils Absolute 0.0 0.0 - 0.1 K/uL   Immature Granulocytes 1 %   Abs Immature Granulocytes 0.07 0.00 - 0.07 K/uL    Comment: Performed at [facility], [address], [place],  [address]  Lactic acid, plasma     Status: Abnormal   Collection Time: 04/23/22  2:39 AM  Result Value Ref Range   Lactic Acid, Venous 2.0 (HH) 0.5 - 1.9 mmol/L    Comment: CRITICAL RESULT CALLED TO, READ BACK BY AND VERIFIED WITH: R.ESTOCE AT 7616 ON 11.23.23 BY ADGER J  Performed at [facility], [address] [place], [place] [address]   Blood gas, venous     Status: Abnormal   Collection Time: 04/23/22  2:39 AM  Result Value Ref Range   pH, Ven 7.38 7.25 - 7.43   pCO2, Ven 45 44 - 60 mmHg   pO2, Ven 65 (H)  32 - 45 mmHg   Bicarbonate 26.6 20.0 - 28.0 mmol/L   Acid-Base Excess 1.3 0.0 - 2.0 mmol/L   O2 Saturation 92.6 %   Patient temperature 37.1    Collection site BLOOD RIGHT HAND    Drawn by [phone]     Comment: Performed at [facility], [address], [place], [place] [address]  Ammonia     Status: Abnormal   Collection Time: 04/23/22  2:39 AM  Result Value Ref Range   Ammonia 78 (H) 9 - 35 umol/L    Comment: Performed at [facility], [address], [place], [place] [id number]  Prepare RBC (crossmatch)     Status: None   Collection Time: 04/23/22  4:05 AM  Result Value Ref Range   Order Confirmation      ORDER PROCESSED BY BLOOD BANK Performed at [facility], [address] [place], [place] [address]   Lactic acid, plasma     Status: None   Collection Time: 04/23/22  5:07 AM  Result Value Ref Range   Lactic Acid, Venous 1.8 0.5 - 1.9 mmol/L    Comment: Performed at [facility], [address] [place], [place] [address]  Type and screen [facility]     Status: None (Preliminary result)   Collection Time: 04/23/22  5:15 AM  Result Value Ref Range   ABO/RH(D) O POS    Antibody Screen NEG    Sample Expiration 04/26/2022,2359    Unit Number [id number]    Blood Component Type RED CELLS,LR    Unit division 00    Status of Unit ALLOCATED    Transfusion Status OK TO TRANSFUSE    Crossmatch Result Compatible    Unit Number [id number]    Blood Component Type RED CELLS,LR    Unit division 00    Status of Unit ISSUED    Transfusion Status OK TO TRANSFUSE    Crossmatch Result      Compatible Performed at [facility], [address], [place], [place] [address]   Glucose, capillary     Status: Abnormal   Collection Time: 04/23/22  7:38 AM  Result Value Ref Range   Glucose-Capillary 135 (H) 70 - 99 mg/dL    Comment: Glucose reference range applies only to samples taken after fasting for at least 8 hours.    DG Chest Port 1 View  Result Date: 04/22/2022 CLINICAL  DATA:  Question of sepsis.  Fever. EXAM: PORTABLE CHEST 1 VIEW COMPARISON:  02/02/2022 FINDINGS: Cardiac enlargement. Small bilateral pleural effusions. Focal opacity in the right mid lung likely representing consolidation and suggesting pneumonia. Possible infiltration or atelectasis in the left lower lung behind the heart. No pneumothorax. Mediastinal contours appear intact. Calcification of the aorta. Degenerative changes in the spine and shoulders. IMPRESSION: 1. Focal consolidation in the right middle lung, likely pneumonia. Possible infiltration or atelectasis in the left lung base behind the heart. 2.  Small bilateral pleural effusions. Electronically Signed   By: Lucienne Capers M.D.   On: 04/22/2022 19:42    ROS: he really has no complaints but may be a little confused  Blood pressure (!) 133/50, pulse 85, temperature 98.3 F (36.8 C), temperature source Oral, resp. rate (!) 22, height '5\' 7"'$  (1.702 m), weight 81 kg, SpO2 96 %. General appearance: alert, pale, and slowed mentation Resp: diminished breath sounds bibasilar Cardio: regular rate and rhythm, S1, S2 normal, no murmur, click, rub or gallop GI: slightly distended, non tender Extremities: edema 2+ Left upper arm AVF-  patent but also with bruising   Assessment/Plan: 76 year old WM with multiple medical issues including ESRD who presents with anemia and fever presumably due to gram-negative rod bacteremia 1 fever and gram-negative rod bacteremia-Per primary.  On cefepime and vanc.  Not entirely sure what the source would be for this infection.  It is exacerbated by the fact that he has decreased white blood count 2 ESRD: Normally Monday Wednesday Friday at [facility].  Had dialysis yesterday.  Would next be due tomorrow-we will arrange 3 Hypertension/volume: Blood pressure is soft at present, however he also has 2+ edema possibly due to hypoalbuminemia.  I would stop IV fluids.  Ultrafiltration gently with dialysis.  He is not on any  blood pressure medications here, is noted to be on Toprol as outpatient.  He does not appear to be on midodrine 4. Anemia of ESRD: He has this as well as known pancytopenia.  He had a recent iron infusion.  We will maximize his ESA 5. 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  Calcium appears low but corrected is okay.  No current phosphorus.  The least of his issues right now   Louis Meckel 04/23/2022, 9:39 AM

## 2022-04-23 NOTE — Assessment & Plan Note (Addendum)
-  Continue rifaximin -Stable mentation currently. -GI service on board and will follow any further recommendations. - Continue PPI twice a day. -Continue the use of lactulose with intention to achieve 2-4 bowel movement daily -Status post paracentesis on 04/27/2022; 3.5 L removed.  Well-tolerated.

## 2022-04-23 NOTE — Progress Notes (Signed)
Pharmacy Brief Antibiotic Note  Jeffrey [name] is a 76 y.o. male admitted on 04/22/2022 with pneumonia with fevers. Patient has known history of pancytopenia  Pharmacy has been consulted for vancomycin and cefepime dosing. Patient is ESRD and receives dialysis on MWF - last known session was yesterday. Will adjust antibiotics accordingly  Plan: Change Cefepime to 2gm q HD Change Vancomycin to 1gm q HD  Height: '5\' 7"'$  (170.2 cm) Weight: 81 kg (178 lb 9.2 oz) IBW/kg (Calculated) : 66.1  Lorenso Courier, PharmD Clinical Pharmacist 04/23/2022 8:21 AM

## 2022-04-23 NOTE — Progress Notes (Signed)
Patient seen and examined; admitted after midnight secondary to febrile illness, tachycardia, tachypnea and intermittent coughing spells.  Workup suggestive of pneumonia; patient has met criteria for sepsis at time of admission.  Currently hemodynamically stable.  Blood cultures and aerobic bottles positive x 2 for gram-negative rods.  Please refer to H&P written by Dr. Clearence Ped for further info/details on admission.  Plan: -Given low hemoglobin patient will be transfuse; Continue to follow hemoglobin trend/stability. -Continue current broad-spectrum antibiotics. -Follow final cultures results for speciation/sensitivity. -Nephrology service will be consulted for resumption of hemodialysis on 04/24/2022 -Follow clinical response and continue supportive care, bronchodilators, antipyretics and adequate hydration.  Barton Dubois MD [phone]

## 2022-04-24 DIAGNOSIS — J189 Pneumonia, unspecified organism: Secondary | ICD-10-CM | POA: Diagnosis not present

## 2022-04-24 DIAGNOSIS — K7031 Alcoholic cirrhosis of liver with ascites: Secondary | ICD-10-CM

## 2022-04-24 DIAGNOSIS — A419 Sepsis, unspecified organism: Secondary | ICD-10-CM | POA: Diagnosis not present

## 2022-04-24 DIAGNOSIS — K219 Gastro-esophageal reflux disease without esophagitis: Secondary | ICD-10-CM | POA: Diagnosis not present

## 2022-04-24 LAB — TYPE AND SCREEN
ABO/RH(D): O POS
Antibody Screen: NEGATIVE
Unit division: 0
Unit division: 0

## 2022-04-24 LAB — BPAM RBC
Blood Product Expiration Date: 202312232359
Blood Product Expiration Date: 202312242359
ISSUE DATE / TIME: 202311230640
ISSUE DATE / TIME: 202311230940
Unit Type and Rh: 5100
Unit Type and Rh: 5100

## 2022-04-24 LAB — CBC
HCT: 30.3 % — ABNORMAL LOW (ref 39.0–52.0)
Hemoglobin: 9.6 g/dL — ABNORMAL LOW (ref 13.0–17.0)
MCH: 31.2 pg (ref 26.0–34.0)
MCHC: 31.7 g/dL (ref 30.0–36.0)
MCV: 98.4 fL (ref 80.0–100.0)
Platelets: 110 10*3/uL — ABNORMAL LOW (ref 150–400)
RBC: 3.08 MIL/uL — ABNORMAL LOW (ref 4.22–5.81)
RDW: 22.3 % — ABNORMAL HIGH (ref 11.5–15.5)
WBC: 11.2 10*3/uL — ABNORMAL HIGH (ref 4.0–10.5)
nRBC: 0 % (ref 0.0–0.2)

## 2022-04-24 LAB — COMPREHENSIVE METABOLIC PANEL WITH GFR
ALT: 90 U/L — ABNORMAL HIGH (ref 0–44)
AST: 132 U/L — ABNORMAL HIGH (ref 15–41)
Albumin: 2.3 g/dL — ABNORMAL LOW (ref 3.5–5.0)
Alkaline Phosphatase: 267 U/L — ABNORMAL HIGH (ref 38–126)
Anion gap: 17 — ABNORMAL HIGH (ref 5–15)
BUN: 39 mg/dL — ABNORMAL HIGH (ref 8–23)
CO2: 22 mmol/L (ref 22–32)
Calcium: 8.6 mg/dL — ABNORMAL LOW (ref 8.9–10.3)
Chloride: 93 mmol/L — ABNORMAL LOW (ref 98–111)
Creatinine, Ser: 3.17 mg/dL — ABNORMAL HIGH (ref 0.61–1.24)
GFR, Estimated: 20 mL/min — ABNORMAL LOW
Glucose, Bld: 206 mg/dL — ABNORMAL HIGH (ref 70–99)
Potassium: 3.5 mmol/L (ref 3.5–5.1)
Sodium: 132 mmol/L — ABNORMAL LOW (ref 135–145)
Total Bilirubin: 0.8 mg/dL (ref 0.3–1.2)
Total Protein: 6 g/dL — ABNORMAL LOW (ref 6.5–8.1)

## 2022-04-24 LAB — PROTIME-INR
INR: 1.6 — ABNORMAL HIGH (ref 0.8–1.2)
Prothrombin Time: 18.7 seconds — ABNORMAL HIGH (ref 11.4–15.2)

## 2022-04-24 LAB — GLUCOSE, CAPILLARY
Glucose-Capillary: 117 mg/dL — ABNORMAL HIGH (ref 70–99)
Glucose-Capillary: 244 mg/dL — ABNORMAL HIGH (ref 70–99)
Glucose-Capillary: 203 mg/dL — ABNORMAL HIGH (ref 70–99)
Glucose-Capillary: 139 mg/dL — ABNORMAL HIGH (ref 70–99)

## 2022-04-24 LAB — HEMOGLOBIN A1C
Hgb A1c MFr Bld: 6.1 % — ABNORMAL HIGH (ref 4.8–5.6)
Mean Plasma Glucose: 128 mg/dL

## 2022-04-24 MED ORDER — LIDOCAINE-PRILOCAINE 2.5-2.5 % EX CREA: 1.0000 | TOPICAL_CREAM | CUTANEOUS | Status: DC | PRN

## 2022-04-24 MED ORDER — DARBEPOETIN ALFA 200 MCG/0.4ML IJ SOSY
PREFILLED_SYRINGE | INTRAMUSCULAR | Status: AC
  Filled 2022-04-24: qty 0.4

## 2022-04-24 MED ORDER — DARBEPOETIN ALFA 300 MCG/0.6ML IJ SOSY
200.0000 ug | PREFILLED_SYRINGE | INTRAMUSCULAR | Status: DC
  Administered 2022-04-24: 200 ug via SUBCUTANEOUS
  Filled 2022-04-24: qty 0.6

## 2022-04-24 MED ORDER — PENTAFLUOROPROP-TETRAFLUOROETH EX AERO: 1.0000 | INHALATION_SPRAY | CUTANEOUS | Status: DC | PRN

## 2022-04-24 MED ORDER — LIDOCAINE HCL (PF) 1 % IJ SOLN: 5.0000 mL | INTRAMUSCULAR | Status: DC | PRN

## 2022-04-24 MED ORDER — FAMOTIDINE 20 MG PO TABS
10.0000 mg | ORAL_TABLET | Freq: Two times a day (BID) | ORAL | Status: DC
  Administered 2022-04-24 – 2022-05-02 (×16): 10 mg via ORAL
  Filled 2022-04-24 (×16): qty 1

## 2022-04-24 NOTE — Progress Notes (Addendum)
Subjective:  BP has been higher but is being taken on his leg.  He is sleeping when I first enter room but arousable-  no complaints  Objective Vital signs in last 24 hours: Vitals:   04/24/22 0400 04/24/22 0500 04/24/22 0600 04/24/22 0806  BP: (!) 174/62 (!) 163/49 (!) 181/62   Pulse: 82 81 84   Resp: '19 15 19   '$ Temp:    (!) 97.5 F (36.4 C)  TempSrc:    Oral  SpO2: 97% 96% 96%   Weight:      Height:       Weight change:   Intake/Output Summary (Last 24 hours) at 04/24/2022 9476 Last data filed at 04/23/2022 1832 Gross per 24 hour  Intake 825 ml  Output 100 ml  Net 725 ml       OP orders-  [facility] MWF  4 hours  EDW 79.5 HD Bath 2/2.5, Dialyzer unknown, Heparin no. Access L UE AVF.- 350 BFR-  gets mircera 200 q 2 weeks given on 11/13, last hgb there was on 11/13 7.9   Assessment/Plan: 76 year old WM with multiple medical issues including ESRD who presents with anemia and fever presumably due to gram-negative rod bacteremia 1 fever and gram-negative rod bacteremia-Per primary.  enerobacter and e coli ?  Now on meropenam and vanc.  Not entirely sure what the source would be for this infection.  It is exacerbated by the fact that he has decreased white blood count 2 ESRD: Normally Monday Wednesday Friday at [facility].  Had dialysis 11/22.   Would next be due today on schedule - has been ordered 3 Hypertension/volume: Blood pressure was soft initially but now higher, he also has 2+ edema possibly due to hypoalbuminemia.  stopped IV fluids.  Ultrafiltration gently with dialysis.  He is not on any blood pressure medications here, is noted to be on Toprol as outpatient.  He does not appear to be on midodrine so presumably does not normally have low BP 4. Anemia of ESRD: He has this as well as known pancytopenia.  He had a recent iron infusion.  Has received 2 units of blood with reasonable response so far.  We will maximize his ESA 5. Metabolic Bone Disease: Is possibly on Tums as  a binder.  Calcium appears low but corrected is okay.  No current phosphorus.  The least of his issues right now 6. Cirrhosis-  GI saw-  feel his prognosis is poor-  was not candidate for TIPS and no invasive eval recommended    Patient will not be physically seen over the weekend-  will arrange for HD next on Monday-  call with any questions     Louis Meckel    Labs: Basic Metabolic Panel: Recent Labs  Lab 04/22/22 1940 04/23/22 0239  NA 132* 132*  K 2.8* 3.0*  CL 92* 98  CO2 25 23  GLUCOSE 129* 114*  BUN 20 23  CREATININE 2.05* 2.23*  CALCIUM 8.1* 7.4*   Liver Function Tests: Recent Labs  Lab 04/22/22 1940 04/23/22 0239  AST 43* 52*  ALT 27 26  ALKPHOS 254* 235*  BILITOT 1.5* 1.0  PROT 6.3* 5.0*  ALBUMIN 2.4* 2.0*   No results for input(s): "LIPASE", "AMYLASE" in the last 168 hours. Recent Labs  Lab 04/23/22 0239  AMMONIA 78*   CBC: Recent Labs  Lab 04/21/22 0846 04/22/22 1940 04/23/22 0239 04/23/22 1543  WBC 3.5* 2.6* 6.8  --   NEUTROABS 2.8 2.5  6.3  --   HGB 6.7* 8.0* 6.8* 9.4*  HCT 21.6* 25.0* 22.2* 29.8*  MCV 105.9* 101.2* 104.7*  --   PLT 122* 117* 79*  --    Cardiac Enzymes: No results for input(s): "CKTOTAL", "CKMB", "CKMBINDEX", "TROPONINI" in the last 168 hours. CBG: Recent Labs  Lab 04/23/22 0738 04/23/22 1124 04/23/22 1620 04/23/22 2014 04/24/22 0747  GLUCAP 135* 181* 190* 222* 117*    Iron Studies: No results for input(s): "IRON", "TIBC", "TRANSFERRIN", "FERRITIN" in the last 72 hours. Studies/Results: DG Chest Port 1 View  Result Date: 04/22/2022 CLINICAL DATA:  Question of sepsis.  Fever. EXAM: PORTABLE CHEST 1 VIEW COMPARISON:  02/02/2022 FINDINGS: Cardiac enlargement. Small bilateral pleural effusions. Focal opacity in the right mid lung likely representing consolidation and suggesting pneumonia. Possible infiltration or atelectasis in the left lower lung behind the heart. No pneumothorax. Mediastinal contours  appear intact. Calcification of the aorta. Degenerative changes in the spine and shoulders. IMPRESSION: 1. Focal consolidation in the right middle lung, likely pneumonia. Possible infiltration or atelectasis in the left lung base behind the heart. 2. Small bilateral pleural effusions. Electronically Signed   By: Lucienne Capers M.D.   On: 04/22/2022 19:42   Medications: Infusions:  meropenem (MERREM) IV 500 mg (04/23/22 1649)   vancomycin      Scheduled Medications:  Chlorhexidine Gluconate Cloth  6 each Topical Q0600   Chlorhexidine Gluconate Cloth  6 each Topical Q0600   famotidine  20 mg Oral BID   hydrOXYzine  25 mg Oral TID   insulin aspart  0-15 Units Subcutaneous TID WC   insulin aspart  0-5 Units Subcutaneous QHS   insulin detemir  5 Units Subcutaneous QHS   lactulose  10 g Oral TID   metoprolol succinate  25 mg Oral Daily   mirtazapine  30 mg Oral QHS   pantoprazole (PROTONIX) IV  40 mg Intravenous Q12H   pravastatin  20 mg Oral Daily   rifaximin  550 mg Oral BID   venlafaxine  75 mg Oral TID WC    have reviewed scheduled and prn medications.  Physical Exam: General: HOH-  nad Heart: RRR Lungs: mostly clear Abdomen: distended Extremities: pitting dep edema Dialysis Access: left upper arm AVF- patent     04/24/2022,9:37 AM  LOS: 2 days

## 2022-04-24 NOTE — Progress Notes (Addendum)
Jeffrey [name], M.D. Gastroenterology & Hepatology   Interval History:  No acute events overnight.  No present fever spikes. Patient reports feeling better and denies any complaints at the moment. Patient had good response to blood transfusion with repeat hemoglobin today of 9.6.  Inpatient Medications:  Current Facility-Administered Medications:    acetaminophen (TYLENOL) tablet 650 mg, 650 mg, Oral, Q6H PRN **OR** acetaminophen (TYLENOL) suppository 650 mg, 650 mg, Rectal, Q6H PRN, [name], Jeffrey B, DO   albuterol (PROVENTIL) (2.5 MG/3ML) 0.083% nebulizer solution 2.5 mg, 2.5 mg, Nebulization, Q2H PRN, [name], Jeffrey B, DO   ALPRAZolam (XANAX) tablet 0.5 mg, 0.5 mg, Oral, Daily PRN, [name], Jeffrey B, DO   Chlorhexidine Gluconate Cloth 2 % PADS 6 each, 6 each, Topical, Q0600, [name], Jeffrey B, DO, 6 each at 04/24/22 0558   Chlorhexidine Gluconate Cloth 2 % PADS 6 each, 6 each, Topical, Q0600, Jeffrey Parish, MD   Darbepoetin Alfa (ARANESP) injection 200 mcg, 200 mcg, Subcutaneous, Q Fri-1800, Jeffrey Parish, MD   famotidine (PEPCID) tablet 20 mg, 20 mg, Oral, BID, [name], Jeffrey B, DO, 20 mg at 04/24/22 0827   hydrOXYzine (ATARAX) tablet 25 mg, 25 mg, Oral, TID, [name], Jeffrey B, DO, 25 mg at 04/24/22 0827   insulin aspart (novoLOG) injection 0-15 Units, 0-15 Units, Subcutaneous, TID WC, [name], Jeffrey B, DO, 3 Units at 04/23/22 1637   insulin aspart (novoLOG) injection 0-5 Units, 0-5 Units, Subcutaneous, QHS, [name], Jeffrey B, DO, 2 Units at 04/23/22 2135   insulin detemir (LEVEMIR) injection 5 Units, 5 Units, Subcutaneous, QHS, [name], Jeffrey B, DO, 5 Units at 04/23/22 2135   lactulose (CHRONULAC) 10 GM/15ML solution 10 g, 10 g, Oral, TID, Montez [name], Jeffrey Knoch, MD, 10 g at 04/23/22 2134   meropenem (MERREM) 500 mg in sodium chloride 0.9 % 100 mL IVPB, 500 mg, Intravenous, Q24H, Jeffrey Dubois, MD, Last Rate: 200 mL/hr at 04/23/22 1649,  500 mg at 04/23/22 1649   metoprolol succinate (TOPROL-XL) 24 hr tablet 25 mg, 25 mg, Oral, Daily, [name], Jeffrey B, DO, 25 mg at 04/24/22 0827   mirtazapine (REMERON) tablet 30 mg, 30 mg, Oral, QHS, [name], Jeffrey B, DO, 30 mg at 04/23/22 2135   ondansetron (ZOFRAN) tablet 4 mg, 4 mg, Oral, Q6H PRN **OR** ondansetron (ZOFRAN) injection 4 mg, 4 mg, Intravenous, Q6H PRN, [name], Jeffrey B, DO   oxyCODONE (Oxy IR/ROXICODONE) immediate release tablet 5 mg, 5 mg, Oral, Q4H PRN, [name], Jeffrey B, DO   pantoprazole (PROTONIX) injection 40 mg, 40 mg, Intravenous, Q12H, [name], Jeffrey B, DO, 40 mg at 04/24/22 0827   polyethylene glycol (MIRALAX / GLYCOLAX) packet 17 g, 17 g, Oral, Daily PRN, [name], Jeffrey B, DO   pravastatin (PRAVACHOL) tablet 20 mg, 20 mg, Oral, Daily, [name], Jeffrey B, DO, 20 mg at 04/24/22 0827   rifaximin (XIFAXAN) tablet 550 mg, 550 mg, Oral, BID, [name], Jeffrey B, DO, 550 mg at 04/24/22 0827   vancomycin (VANCOCIN) IVPB 1000 mg/200 mL premix, 1,000 mg, Intravenous, Q M,W,F-HD, Jeffrey [name], RPH   venlafaxine (EFFEXOR) tablet 75 mg, 75 mg, Oral, TID WC, [name], Jeffrey B, DO, 75 mg at 04/23/22 1637   I/O    Intake/Output Summary (Last 24 hours) at 04/24/2022 1120 Last data filed at 04/24/2022 0900 Gross per 24 hour  Intake 945 ml  Output 100 ml  Net 845 ml     Physical Exam: Temp:  [97.5 F (36.4 [name])-99 F (37.2 [name])] 97.5 F (36.4 [name]) (11/24 0806) Pulse Rate:  [79-88] 88 (  11/24 0830) Resp:  [15-27] 18 (11/24 0830) BP: (129-181)/(46-64) 181/62 (11/24 0600) SpO2:  [87 %-97 %] 92 % (11/24 0830)  Temp (24hrs), Avg:98.3 F (36.8 [name]), Min:97.5 F (36.4 [name]), Max:99 F (37.2 [name]) GENERAL: The patient is AO x3, in no acute distress. On [place] at 3 L min. HEENT: Head is normocephalic and atraumatic. EOMI are intact. Mouth is well hydrated and without lesions. NECK: Supple. No masses LUNGS: Clear to auscultation. No presence of  rhonchi/wheezing/rales. Adequate chest expansion HEART: RRR, normal s1 and s2. ABDOMEN: Soft, nontender, no guarding, no peritoneal signs. Mildly distended diffusely but not tense. BS +. No masses. EXTREMITIES: Without any cyanosis, clubbing, rash, lesions or edema. NEUROLOGIC: AOx3, no focal motor deficit.  Has mild asterixis. SKIN: no jaundice, no rashes  Laboratory Data: CBC:     Component Value Date/Time   WBC 11.2 (H) 04/24/2022 1012   RBC 3.08 (L) 04/24/2022 1012   HGB 9.6 (L) 04/24/2022 1012   HCT 30.3 (L) 04/24/2022 1012   HCT 28 02/25/2012 1034   PLT 110 (L) 04/24/2022 1012   PLT 125 02/25/2012 1034   MCV 98.4 04/24/2022 1012   MCV 82.0 02/25/2012 1034   MCH 31.2 04/24/2022 1012   MCHC 31.7 04/24/2022 1012   RDW 22.3 (H) 04/24/2022 1012   LYMPHSABS 0.1 (L) 04/23/2022 0239   MONOABS 0.3 04/23/2022 0239   EOSABS 0.0 04/23/2022 0239   BASOSABS 0.0 04/23/2022 0239   COAG:  Lab Results  Component Value Date   INR 1.6 (H) 04/24/2022   INR 1.5 (H) 04/23/2022   INR 1.3 (H) 04/22/2022    BMP:     Latest Ref Rng & Units 04/24/2022   10:12 AM 04/23/2022    2:39 AM 04/22/2022    7:40 PM  BMP  Glucose 70 - 99 mg/dL 206  114  129   BUN 8 - 23 mg/dL 39  23  20   Creatinine 0.61 - 1.24 mg/dL 3.17  2.23  2.05   Sodium 135 - 145 mmol/L 132  132  132   Potassium 3.5 - 5.1 mmol/L 3.5  3.0  2.8   Chloride 98 - 111 mmol/L 93  98  92   CO2 22 - 32 mmol/L _0 Calcium 8.9 - 10.3 mg/dL 8.6  7.4  8.1     HEPATIC:     Latest Ref Rng & Units 04/24/2022   10:12 AM 04/23/2022    2:39 AM 04/22/2022    7:40 PM  Hepatic Function  Total Protein 6.5 - 8.1 g/dL 6.0  5.0  6.3   Albumin 3.5 - 5.0 g/dL 2.3  2.0  2.4   AST 15 - 41 U/L 132  52  43   ALT 0 - 44 U/L 90  26  27   Alk Phosphatase 38 - 126 U/L 267  235  254   Total Bilirubin 0.3 - 1.2 mg/dL 0.8  1.0  1.5     CARDIAC:  Lab Results  Component Value Date   TROPONINI <0.03 01/10/2016      Imaging: I personally  reviewed and interpreted the available labs, imaging and endoscopic files.   Assessment/Plan: Jeffrey [name] is a 76 y.o. 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  The patient was found to have sepsis due to pneumonia for which he is undergoing antibiotic treatment and management at the ICU.  Gastroenterology was consulted for recurrent anemia.   The patient has had extensive endoscopic investigations.  So far, the evaluation has been unremarkable for possible intermittent slow losses from severe portal hypertensive gastropathy.  He has not presented any overt gastrointestinal bleeding recently.  He was referred for TIPS evaluation but given his MELD score he was not considered an adequate candidate for this.  He has received transfusions as needed.  For now, given his critical status in the setting of infection, we will recommend continuing PPI twice daily and checking the H&H every day as a, conservative management but if he were to have overt bleeding or recurrent requirement of transfusions, may consider repeating an EGD during the current hospitalization once he has stabilized.  Fortunately, he has responded to blood transfusion during the current hospitalization.  Overall, given his comorbidities and advanced liver disease with nontransplant candidacy status, it  will be important to have an evaluation by palliative care regarding goals of care.   He has not presented any other overt decompensating events recently, but he is still some asterixis concerning for ongoing HE in the setting of acute infection. Will need to continue Lactulose and Xifaxan to achieve 2-3 Bms per day.   MELD today  is 26.   - H/H qday - Pantoprazole 40 mg BID - Heart healthy diet - Daily MELD labs - Lactulose 10 mg TID, titrate to achieve 2-3 Bms per day - Xifaxan 550 mg BID - Restart home diuretics once HD stable - Pneumonia sepsis management per primary team - Palliative care consult   Jeffrey Peppers, MD [facility]astroenterology [facility]

## 2022-04-24 NOTE — Care Management Important Message (Signed)
Important Message  Patient Details  Name: HEMI CHACKO MRN: [id number] Date of Birth: 1945/10/20   Medicare Important Message Given:  N/A - LOS <3 / Initial given by admissions     [name] 04/24/2022, 3:21 PM

## 2022-04-24 NOTE — Procedures (Signed)
HEMODIALYSIS TREATMENT NOTE:   4 hour heparin-free treatment completed using left upper arm AVF (15g/antegrade). Goal met: 3 liters removed without interruption in UF.  Darbepoetin and Vancomycin were given. All blood was returned.  Prolonged bleeding post-needle removal; hemostasis was achieved after 40 minutes.  Hand-off given to [facility], Therapist, sports.   Rockwell Alexandria, RN

## 2022-04-24 NOTE — Progress Notes (Signed)
Progress Note   Patient: Jeffrey [name] OBS:[id number] DOB: 1945-11-09 DOA: 04/22/2022     2 DOS: the patient was seen and examined on 04/24/2022   Brief hospital course: As per H&P written by Dr.Zierle-Ghosh on 04/23/22 Jeffrey [name] is a 76 y.o. male with medical history significant of atrial fibrillation, alcoholic cirrhosis, anxiety, chronic kidney disease, diabetes mellitus type 2, GERD, esophageal varices, hypertension, hard of hearing, and more presents the ED with a chief complaint of fever.  Unfortunately at the time of my exam, patient cannot stay awake long enough to answer questions.  I think this is less likely acute metabolic encephalopathy and more likely the fact that it is the middle of the night.  In any event adding on VBG and ammonia to this morning's labs.  In the ED patient's wife had told them that the patient had had a fever of 101 at home.  They decided to come to the hospital after a dose of Tylenol did not relieve the fever.  Patient had been in the oncology clinic yesterday.  History of iron deficiency anemia was transfused secondary to hemoglobin of 6.7.  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  He has had history of GI bleed recently in September.  He had a apparently complained of continued melena at the heme-onc clinic as well.   Patient has been both DNR and full code in past hospitalizations.  On his most recent H&P he was listed as full code so we will continue with that.  Assessment and Plan: * Sepsis due to pneumonia ([place]) - Febrile up to 103, tacky up to 95, tachypneic up to 27, 2.6, lactic acid 4.6 at time of admission; Patient met criteria for sepsis. -Positive blood cultures for gram-negative rods bacteremia and chest x-ray demonstrating pneumonia. -Continue current antibiotics -Continue the use of flutter valve and bronchodilator management -Blood cultures have turned out to be positive for gram-negative rods 4 out of 4; antibiotics adjusted to meropenem and  vancomycin. -Follow clinical response.   ESRD on dialysis [facility]) - Monday Wednesday Friday outpatient HD schedule - Nephrology consulted, follow recommendation -Plan for hemodialysis today (1123).    Chronic respiratory failure with hypoxia (HCC) - On 2-3 L nasal cannula at baseline - Currently maintaining oxygen sats on the same - Continue as needed bronchodilator management.  Hypokalemia - ST eval potassium level: -Follow electrolytes while receiving dialysis.  Gastroesophageal reflux disease -Continue PPI twice a day.  Controlled type 2 diabetes mellitus with chronic kidney disease on chronic dialysis, with long-term current use of insulin (HCC) - Continue sliding scale insulin -A1c 6.1 -Follow CBGs fluctuation.  Alcoholic cirrhosis (HCC) -Continue lactulose and rifaximin -Stable mentation currently. -GI service on board and will follow any further recommendations. - Continue PPI twice a day.  Essential hypertension, benign - Blood pressure overall stable -Continue metoprolol -Follow-up vital signs.   Subjective:  Afebrile, no chest pain, no nausea vomiting.  Reports improvement in his breathing and is feeling better.  Good saturation on chronic supplementation.  Physical Exam: Vitals:   04/24/22 0600 04/24/22 0806 04/24/22 0830 04/24/22 1157  BP: (!) 181/62     Pulse: 84  88   Resp: 19  18   Temp:  (!) 97.5 F (36.4 C)  98.2 F (36.8 C)  TempSrc:  Oral  Oral  SpO2: 96%  92%   Weight:      Height:       General exam: Alert, awake, oriented x 3; good saturation on chronic  supplementation; no overt bleeding reported.  Currently afebrile. Respiratory system: Positive rhonchi bilaterally; no using accessory muscles.  Patient denies chest pain.  Is present with coughing spells. Cardiovascular system:RRR.  No rubs, no gallops. Gastrointestinal system: Abdomen is mildly distended diffusely; but denies tenderness on palpation.  Positive bowel sounds.  No masses  appreciated. Central nervous system: Alert and oriented. No focal neurological deficits. Extremities: No cyanosis or clubbing; 1+ edema appreciated bilaterally. Skin: No petechiae, no jaundice. Psychiatry: Judgement and insight appear normal. Mood & affect appropriate.   Data Reviewed: CBGs: 361-443. CBC: WBCs 11.2, hemoglobin 9.6, platelet count 110 K Comprehensive metabolic panel: Sodium 154, potassium 3.5, chloride 39, creatinine 3.17, AST 132, ALT 90, alkaline phosphatase 267 and GFR 20.  Anion gap 17. Prothrombin/INR: 18.7/1.6 -MELD score 26 currently.  Family Communication: No family at bedside.  Disposition: Status is: Inpatient Remains inpatient appropriate because: Continue treatment for healthcare associated pneumonia (meeting criteria for sepsis at time of admission) and bacteremia.   Planned Discharge Destination: Home   Time spent: 50 minutes  Author: Barton Dubois, MD 04/24/2022 1:39 PM  For on call review [email or web address].

## 2022-04-25 DIAGNOSIS — N186 End stage renal disease: Secondary | ICD-10-CM | POA: Diagnosis not present

## 2022-04-25 DIAGNOSIS — D500 Iron deficiency anemia secondary to blood loss (chronic): Secondary | ICD-10-CM | POA: Diagnosis not present

## 2022-04-25 DIAGNOSIS — J189 Pneumonia, unspecified organism: Secondary | ICD-10-CM | POA: Diagnosis not present

## 2022-04-25 DIAGNOSIS — K7031 Alcoholic cirrhosis of liver with ascites: Secondary | ICD-10-CM | POA: Diagnosis not present

## 2022-04-25 DIAGNOSIS — A419 Sepsis, unspecified organism: Secondary | ICD-10-CM | POA: Diagnosis not present

## 2022-04-25 LAB — URINE CULTURE: Culture: 40000 — AB

## 2022-04-25 LAB — CULTURE, BLOOD (ROUTINE X 2): Special Requests: ADEQUATE

## 2022-04-25 LAB — GLUCOSE, CAPILLARY
Glucose-Capillary: 175 mg/dL — ABNORMAL HIGH (ref 70–99)
Glucose-Capillary: 163 mg/dL — ABNORMAL HIGH (ref 70–99)
Glucose-Capillary: 197 mg/dL — ABNORMAL HIGH (ref 70–99)
Glucose-Capillary: 207 mg/dL — ABNORMAL HIGH (ref 70–99)

## 2022-04-25 MED ORDER — ZINC SULFATE 220 (50 ZN) MG PO CAPS
220.0000 mg | ORAL_CAPSULE | Freq: Every day | ORAL | Status: DC
  Administered 2022-04-25 – 2022-05-02 (×8): 220 mg via ORAL
  Filled 2022-04-25 (×7): qty 1

## 2022-04-25 NOTE — Progress Notes (Signed)
Progress Note   Patient: Jeffrey [name] DOB: 26-Oct-1945 DOA: 04/22/2022     3 DOS: the patient was seen and examined on 04/25/2022   Brief hospital course: As per H&P written by Dr.Zierle-Ghosh on 04/23/22 Jeffrey [name] is a 76 y.o. male with medical history significant of atrial fibrillation, alcoholic cirrhosis, anxiety, chronic kidney disease, diabetes mellitus type 2, GERD, esophageal varices, hypertension, hard of hearing, and more presents the ED with a chief complaint of fever.  Unfortunately at the time of my exam, patient cannot stay awake long enough to answer questions.  I think this is less likely acute metabolic encephalopathy and more likely the fact that it is the middle of the night.  In any event adding on VBG and ammonia to this morning's labs.  In the ED patient's wife had told them that the patient had had a fever of 101 at home.  They decided to come to the hospital after a dose of Tylenol did not relieve the fever.  Patient had been in the oncology clinic yesterday.  History of iron deficiency anemia was transfused secondary to hemoglobin of 6.7.  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  He has had history of GI bleed recently in September.  He had a apparently complained of continued melena at the heme-onc clinic as well.   Patient has been both DNR and full code in past hospitalizations.  On his most recent H&P he was listed as full code so we will continue with that.  Assessment and Plan: * Sepsis due to pneumonia ([place]) - Febrile up to 103, tacky up to 95, tachypneic up to 27, 2.6, lactic acid 4.6 at time of admission; Patient met criteria for sepsis. -Positive blood cultures for gram-negative rods bacteremia and chest x-ray demonstrating pneumonia. -Blood cultures have turned out to be positive for gram-negative rods 4 out of 4; antibiotics adjusted to meropenem. -Continue to follow clinical response -Continue the use of flutter valve and bronchodilator  management. -MRSA PCR negative; vancomycin will be discontinued.   ESRD on dialysis [facility]) - Monday, Wednesday and Friday outpatient HD schedule - Nephrology consulted, follow recommendation -Plan for hemodialysis again on 04/27/2022.   -Appears to be stable overall.  Chronic respiratory failure with hypoxia (HCC) - On 2-3 L nasal cannula at baseline - Currently maintaining oxygen sats on the same - Continue as needed bronchodilator management.  Hypokalemia - Continue to follow electrolytes trend -Hemodialysis to further stabilize electrolytes levels.  Gastroesophageal reflux disease -Continue PPI twice a day.  Controlled type 2 diabetes mellitus with chronic kidney disease on chronic dialysis, with long-term current use of insulin (HCC) -Continue sliding scale insulin -A1c 6.1 -Follow CBGs fluctuation.  Alcoholic cirrhosis (HCC) -Continue rifaximin -Stable mentation currently. -GI service on board and will follow any further recommendations. - Continue PPI twice a day. -Continue the use of lactulose with intention to achieve 2-4 bowel movement daily -Paracentesis recommended on Monday.  Essential hypertension, benign - Blood pressure overall stable -Continue metoprolol -Follow-up vital signs.   Subjective:  No fever, no chest pain, no nausea or vomiting.  Reports over 4 bowel movements overnight.  Patient expressed feeling better.  No overt bleeding.   Physical Exam: Vitals:   04/24/22 2230 04/25/22 0008 04/25/22 0410 04/25/22 1351  BP: (!) 141/57 139/71 137/82 (!) 144/83  Pulse: 89 94 100 88  Resp: _0 Temp: 97.9 F (36.6 C) (!) 96.6 F (35.9 C) (!) 97.1 F (36.2 C) 98.5 F (36.9  C)  TempSrc: Oral   Oral  SpO2: 100% 95% 100% 100%  Weight:  82.1 kg    Height:       General exam: Alert, awake, oriented x 3; following commands appropriately and overall feeling better.  No overnight events.  Reports approximately 4 bowel movements. Respiratory  system: Improved from movement bilaterally; no using accessory muscles.  Good saturation on chronic supplementation. Cardiovascular system:RRR. No rubs or gallops.  No JVD on exam. Gastrointestinal system: Abdomen is distended on examination; no tenderness on palpation, patient denies any guarding and positive bowel sounds appreciated.  Positive ascitic fluid felt on evaluation.   Central nervous system: Alert and oriented. No focal neurological deficits. Extremities: No cyanosis or clubbing; trace to 1+ edema appreciated bilaterally. Skin: No petechiae. Psychiatry: Judgement and insight appear normal. Mood & affect appropriate.    Data Reviewed: CBGs: 160-190 Latest CBC: WBCs 11.2, hemoglobin 9.6, platelet count 110 K Latest comprehensive metabolic panel: Sodium 929, potassium 3.5, chloride 39, creatinine 3.17, AST 132, ALT 90, alkaline phosphatase 267 and GFR 20.  Anion gap 17. Prothrombin/INR: 18.7/1.6 Last MELD score 26 calculated.  Family Communication: No family at bedside.  Disposition: Status is: Inpatient Remains inpatient appropriate because: Continue treatment for healthcare associated pneumonia (meeting criteria for sepsis at time of admission) and bacteremia.   Planned Discharge Destination: Home   Time spent: 35 minutes  Author: Barton Dubois, MD 04/25/2022 5:07 PM  For on call review [email or web address].

## 2022-04-25 NOTE — Progress Notes (Signed)
Jeffrey [name], M.D. Gastroenterology & Hepatology   Interval History:  No acute events overnight. Patient reports feeling well and denies any complaints.  Has been eating adequately his diet without any nausea or vomiting. States that his abdomen is slightly more distended today but denies any pain. No melena or hematochezia. Had close to 4 bowel movements yesterday.  Inpatient Medications:  Current Facility-Administered Medications:    acetaminophen (TYLENOL) tablet 650 mg, 650 mg, Oral, Q6H PRN **OR** acetaminophen (TYLENOL) suppository 650 mg, 650 mg, Rectal, Q6H PRN, Barton Dubois, MD   albuterol (PROVENTIL) (2.5 MG/3ML) 0.083% nebulizer solution 2.5 mg, 2.5 mg, Nebulization, Q2H PRN, Barton Dubois, MD   ALPRAZolam Duanne Moron) tablet 0.5 mg, 0.5 mg, Oral, Daily PRN, Barton Dubois, MD   Chlorhexidine Gluconate Cloth 2 % PADS 6 each, 6 each, Topical, Q0600, Barton Dubois, MD, 6 each at 04/25/22 0602   Chlorhexidine Gluconate Cloth 2 % PADS 6 each, 6 each, Topical, Q0600, Barton Dubois, MD, 6 each at 04/25/22 7829   Darbepoetin Alfa (ARANESP) injection 200 mcg, 200 mcg, Subcutaneous, Q Fri-1800, Barton Dubois, MD, 200 mcg at 04/24/22 1900   famotidine (PEPCID) tablet 10 mg, 10 mg, Oral, BID, Reome, Earle J, RPH, 10 mg at 04/24/22 2309   hydrOXYzine (ATARAX) tablet 25 mg, 25 mg, Oral, TID, Barton Dubois, MD, 25 mg at 04/24/22 2309   insulin aspart (novoLOG) injection 0-15 Units, 0-15 Units, Subcutaneous, TID WC, Barton Dubois, MD, 5 Units at 04/24/22 1704   insulin aspart (novoLOG) injection 0-5 Units, 0-5 Units, Subcutaneous, QHS, Barton Dubois, MD, 2 Units at 04/23/22 2135   insulin detemir (LEVEMIR) injection 5 Units, 5 Units, Subcutaneous, QHS, Barton Dubois, MD, 5 Units at 04/24/22 2308   lactulose ([place]) 10 GM/15ML solution 10 g, 10 g, Oral, TID, Barton Dubois, MD, 10 g at 04/24/22 2310   lidocaine (PF) (XYLOCAINE) 1 % injection 5 mL, 5 mL, Intradermal, PRN,  Corliss Parish, MD   lidocaine-prilocaine (EMLA) cream 1 Application, 1 Application, Topical, PRN, Corliss Parish, MD   meropenem (MERREM) 500 mg in sodium chloride 0.9 % 100 mL IVPB, 500 mg, Intravenous, Q24H, Barton Dubois, MD, Last Rate: 200 mL/hr at 04/24/22 1526, 500 mg at 04/24/22 1526   metoprolol succinate (TOPROL-XL) 24 hr tablet 25 mg, 25 mg, Oral, Daily, Barton Dubois, MD, 25 mg at 04/24/22 0827   mirtazapine (REMERON) tablet 30 mg, 30 mg, Oral, QHS, Barton Dubois, MD, 30 mg at 04/24/22 2309   ondansetron (ZOFRAN) tablet 4 mg, 4 mg, Oral, Q6H PRN **OR** ondansetron (ZOFRAN) injection 4 mg, 4 mg, Intravenous, Q6H PRN, Barton Dubois, MD   oxyCODONE (Oxy IR/ROXICODONE) immediate release tablet 5 mg, 5 mg, Oral, Q4H PRN, Barton Dubois, MD   pantoprazole (PROTONIX) injection 40 mg, 40 mg, Intravenous, Q12H, Barton Dubois, MD, 40 mg at 04/24/22 2309   pentafluoroprop-tetrafluoroeth (GEBAUERS) aerosol 1 Application, 1 Application, Topical, PRN, Corliss Parish, MD   polyethylene glycol (MIRALAX / GLYCOLAX) packet 17 g, 17 g, Oral, Daily PRN, Barton Dubois, MD   pravastatin (PRAVACHOL) tablet 20 mg, 20 mg, Oral, Daily, Barton Dubois, MD, 20 mg at 04/24/22 0827   rifaximin (XIFAXAN) tablet 550 mg, 550 mg, Oral, BID, Barton Dubois, MD, 550 mg at 04/24/22 2309   vancomycin (VANCOCIN) IVPB 1000 mg/200 mL premix, 1,000 mg, Intravenous, Q M,W,F-HD, Barton Dubois, MD, Last Rate: 200 mL/hr at 04/24/22 2100, 1,000 mg at 04/24/22 2100   venlafaxine (EFFEXOR) tablet 75 mg, 75 mg, Oral, TID WC, Barton Dubois, MD, 75 mg at 04/24/22  1528   I/O    Intake/Output Summary (Last 24 hours) at 04/25/2022 1013 Last data filed at 04/25/2022 0900 Gross per 24 hour  Intake 680 ml  Output 3350 ml  Net -2670 ml     Physical Exam: Temp:  [96.6 F (35.9 C)-98.2 F (36.8 C)] 97.1 F (36.2 C) (11/25 0410) Pulse Rate:  [81-100] 100 (11/25 0410) Resp:  [18-22] 18 (11/25 0410) BP:  (115-173)/(56-87) 137/82 (11/25 0410) SpO2:  [95 %-100 %] 100 % (11/25 0410) Weight:  [82.1 kg-86 kg] 82.1 kg (11/25 0008)  Temp (24hrs), Avg:97.6 F (36.4 C), Min:96.6 F (35.9 C), Max:98.2 F (36.8 C) GENERAL: The patient is AO x3, in no acute distress. On [place] at 3 L min. HEENT: Head is normocephalic and atraumatic. EOMI are intact. Mouth is well hydrated and without lesions. NECK: Supple. No masses LUNGS: Clear to auscultation. No presence of rhonchi/wheezing/rales. Adequate chest expansion HEART: RRR, normal s1 and s2. ABDOMEN: Soft, nontender, no guarding, no peritoneal signs. Mildly distended diffusely but not tense. BS +. No masses. EXTREMITIES: Without any cyanosis, clubbing, rash, lesions or edema. NEUROLOGIC: AOx3, no focal motor deficit.  Has mild asterixis, better compared to prior. SKIN: no jaundice, no rashes  Laboratory Data: CBC:     Component Value Date/Time   WBC 11.2 (H) 04/24/2022 1012   RBC 3.08 (L) 04/24/2022 1012   HGB 9.6 (L) 04/24/2022 1012   HCT 30.3 (L) 04/24/2022 1012   HCT 28 02/25/2012 1034   PLT 110 (L) 04/24/2022 1012   PLT 125 02/25/2012 1034   MCV 98.4 04/24/2022 1012   MCV 82.0 02/25/2012 1034   MCH 31.2 04/24/2022 1012   MCHC 31.7 04/24/2022 1012   RDW 22.3 (H) 04/24/2022 1012   LYMPHSABS 0.1 (L) 04/23/2022 0239   MONOABS 0.3 04/23/2022 0239   EOSABS 0.0 04/23/2022 0239   BASOSABS 0.0 04/23/2022 0239   COAG:  Lab Results  Component Value Date   INR 1.6 (H) 04/24/2022   INR 1.5 (H) 04/23/2022   INR 1.3 (H) 04/22/2022    BMP:     Latest Ref Rng & Units 04/24/2022   10:12 AM 04/23/2022    2:39 AM 04/22/2022    7:40 PM  BMP  Glucose 70 - 99 mg/dL 206  114  129   BUN 8 - 23 mg/dL 39  23  20   Creatinine 0.61 - 1.24 mg/dL 3.17  2.23  2.05   Sodium 135 - 145 mmol/L 132  132  132   Potassium 3.5 - 5.1 mmol/L 3.5  3.0  2.8   Chloride 98 - 111 mmol/L 93  98  92   CO2 22 - 32 mmol/L _0 Calcium 8.9 - 10.3 mg/dL 8.6  7.4  8.1      HEPATIC:     Latest Ref Rng & Units 04/24/2022   10:12 AM 04/23/2022    2:39 AM 04/22/2022    7:40 PM  Hepatic Function  Total Protein 6.5 - 8.1 g/dL 6.0  5.0  6.3   Albumin 3.5 - 5.0 g/dL 2.3  2.0  2.4   AST 15 - 41 U/L 132  52  43   ALT 0 - 44 U/L 90  26  27   Alk Phosphatase 38 - 126 U/L 267  235  254   Total Bilirubin 0.3 - 1.2 mg/dL 0.8  1.0  1.5     CARDIAC:  Lab Results  Component Value Date  TROPONINI <0.03 01/10/2016      Imaging: I personally reviewed and interpreted the available labs, imaging and endoscopic files.   Assessment/Plan: Jeffrey [name] is a 76 y.o. 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  The patient was found to have sepsis due to pneumonia for which he is undergoing antibiotic treatment and management at the ICU.  Gastroenterology was consulted for recurrent anemia.   The patient has had extensive endoscopic investigations.  So far, the evaluation has been unremarkable for possible intermittent slow losses from severe portal hypertensive gastropathy.  He has not presented any overt gastrointestinal bleeding recently.  He was referred for TIPS evaluation but given his MELD score he was not considered an adequate candidate for this.  He has received transfusions as needed.  Given his critical status in the setting of infection, we will recommend continuing PPI twice daily and checking the H&H every day with conservative management but if he were to have overt bleeding or recurrent requirement of transfusions, may consider repeating an EGD during the current hospitalization.  Fortunately, he responded to blood transfusion and has not presented any  further drop in his hemoglobin with last hemoglobin 9.6.  Overall, given his comorbidities and advanced liver disease with nontransplant candidacy status, it  will be important to have an evaluation by palliative care regarding goals of care.   He has not presented any other overt decompensating events recently, has very mild asterixis concerning for ongoing HE in the setting of acute infection. Will need to continue Lactulose and Xifaxan to achieve 2-3 Bms per day, will add zinc oxide to his regimen.  As the patient has presented some progressive distention of his abdomen despite undergoing dialysis, may consider doing a paracentesis on Monday prior to being discharged.   MELD yesterday was 26, no MELD labs available today.   - H/H qday - Pantoprazole 40 mg BID - Heart healthy diet - Lactulose 10 mg TID, titrate to achieve 2-4 Bms per day - Xifaxan 550 mg BID - Zn oxide 220 mg daily -Consider therapeutic paracentesis on Monday prior to being discharged from the hospital. - Pneumonia sepsis management per primary team - Palliative care consult - Patient will follow up in GI clinic in 3-4 weeks. - GI service will sign-off, please call us back if you have any more questions.   Jeffrey Peppers, MD Gastroenterology and Hepatology [facility]

## 2022-04-25 NOTE — Progress Notes (Signed)
Pt's vitals stable. A&O x 4. Pt has diminished breath sounds bilaterally. +1 generalized edema present.

## 2022-04-26 DIAGNOSIS — D500 Iron deficiency anemia secondary to blood loss (chronic): Secondary | ICD-10-CM | POA: Diagnosis not present

## 2022-04-26 DIAGNOSIS — A419 Sepsis, unspecified organism: Secondary | ICD-10-CM | POA: Diagnosis not present

## 2022-04-26 DIAGNOSIS — J189 Pneumonia, unspecified organism: Secondary | ICD-10-CM | POA: Diagnosis not present

## 2022-04-26 DIAGNOSIS — K7031 Alcoholic cirrhosis of liver with ascites: Secondary | ICD-10-CM | POA: Diagnosis not present

## 2022-04-26 LAB — COMPREHENSIVE METABOLIC PANEL
ALT: 60 U/L — ABNORMAL HIGH (ref 0–44)
AST: 46 U/L — ABNORMAL HIGH (ref 15–41)
Albumin: 2.2 g/dL — ABNORMAL LOW (ref 3.5–5.0)
Alkaline Phosphatase: 289 U/L — ABNORMAL HIGH (ref 38–126)
Anion gap: 14 (ref 5–15)
BUN: 36 mg/dL — ABNORMAL HIGH (ref 8–23)
CO2: 23 mmol/L (ref 22–32)
Calcium: 8.3 mg/dL — ABNORMAL LOW (ref 8.9–10.3)
Chloride: 94 mmol/L — ABNORMAL LOW (ref 98–111)
Creatinine, Ser: 2.9 mg/dL — ABNORMAL HIGH (ref 0.61–1.24)
GFR, Estimated: 22 mL/min — ABNORMAL LOW (ref >60)
Glucose, Bld: 152 mg/dL — ABNORMAL HIGH (ref 70–99)
Potassium: 3.7 mmol/L (ref 3.5–5.1)
Sodium: 131 mmol/L — ABNORMAL LOW (ref 135–145)
Total Bilirubin: 0.8 mg/dL (ref 0.3–1.2)
Total Protein: 5.9 g/dL — ABNORMAL LOW (ref 6.5–8.1)

## 2022-04-26 LAB — GLUCOSE, CAPILLARY
Glucose-Capillary: 163 mg/dL — ABNORMAL HIGH (ref 70–99)
Glucose-Capillary: 153 mg/dL — ABNORMAL HIGH (ref 70–99)
Glucose-Capillary: 90 mg/dL (ref 70–99)
Glucose-Capillary: 213 mg/dL — ABNORMAL HIGH (ref 70–99)
Glucose-Capillary: 321 mg/dL — ABNORMAL HIGH (ref 70–99)
Glucose-Capillary: 136 mg/dL — ABNORMAL HIGH (ref 70–99)

## 2022-04-26 LAB — CBC
HCT: 29.3 % — ABNORMAL LOW (ref 39.0–52.0)
Hemoglobin: 9.3 g/dL — ABNORMAL LOW (ref 13.0–17.0)
MCH: 31.1 pg (ref 26.0–34.0)
MCHC: 31.7 g/dL (ref 30.0–36.0)
MCV: 98 fL (ref 80.0–100.0)
Platelets: 89 10*3/uL — ABNORMAL LOW (ref 150–400)
RBC: 2.99 MIL/uL — ABNORMAL LOW (ref 4.22–5.81)
RDW: 20.7 % — ABNORMAL HIGH (ref 11.5–15.5)
WBC: 5.2 10*3/uL (ref 4.0–10.5)
nRBC: 0 % (ref 0.0–0.2)

## 2022-04-26 NOTE — Progress Notes (Signed)
Progress Note   Patient: Jeffrey [name] EXN:[id number] DOB: Jul 24, 1945 DOA: 04/22/2022     4 DOS: the patient was seen and examined on 04/26/2022   Brief hospital course: As per H&P written by Dr.Zierle-Ghosh on 04/23/22 Jeffrey [name] is a 76 y.o. male with medical history significant of atrial fibrillation, alcoholic cirrhosis, anxiety, chronic kidney disease, diabetes mellitus type 2, GERD, esophageal varices, hypertension, hard of hearing, and more presents the ED with a chief complaint of fever.  Unfortunately at the time of my exam, patient cannot stay awake long enough to answer questions.  I think this is less likely acute metabolic encephalopathy and more likely the fact that it is the middle of the night.  In any event adding on VBG and ammonia to this morning's labs.  In the ED patient's wife had told them that the patient had had a fever of 101 at home.  They decided to come to the hospital after a dose of Tylenol did not relieve the fever.  Patient had been in the oncology clinic yesterday.  History of iron deficiency anemia was transfused secondary to hemoglobin of 6.7.  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  He has had history of GI bleed recently in September.  He had a apparently complained of continued melena at the heme-onc clinic as well.   Patient has been both DNR and full code in past hospitalizations.  On his most recent H&P he was listed as full code so we will continue with that.  Assessment and Plan: * Sepsis due to pneumonia ([place]) - Febrile up to 103, tacky up to 95, tachypneic up to 27, 2.6, lactic acid 4.6 at time of admission; Patient met criteria for sepsis. -Positive blood cultures for gram-negative rods bacteremia and chest x-ray demonstrating pneumonia. -Blood cultures have turned out to be positive for gram-negative rods 4 out of 4; antibiotics adjusted to meropenem. -Continue to follow clinical response -Continue the use of flutter valve and bronchodilator  management. -MRSA PCR negative; vancomycin will be discontinued.   ESRD on dialysis [facility]) - Monday, Wednesday and Friday outpatient HD schedule - Nephrology consulted, follow recommendation -Plan for hemodialysis again on 04/27/2022.   -Appears to be stable overall.  Chronic respiratory failure with hypoxia (HCC) - On 2-3 L nasal cannula at baseline - Currently maintaining oxygen sats on the same - Continue as needed bronchodilator management.  Hypokalemia - Continue to follow electrolytes trend -Hemodialysis to further stabilize electrolytes levels.  Gastroesophageal reflux disease -Continue PPI twice a day.  Controlled type 2 diabetes mellitus with chronic kidney disease on chronic dialysis, with long-term current use of insulin (HCC) -Continue sliding scale insulin -A1c 6.1 -Follow CBGs fluctuation.  Alcoholic cirrhosis (HCC) -Continue rifaximin -Stable mentation currently. -GI service on board and will follow any further recommendations. - Continue PPI twice a day. -Continue the use of lactulose with intention to achieve 2-4 bowel movement daily -Paracentesis recommended on Monday.  Essential hypertension, benign - Blood pressure overall stable -Continue metoprolol -Follow-up vital signs.   Subjective:  Afebrile, no chest pain, no nausea, no vomiting.  Reports breathing continues to improve.  No overt bleeding reported.  Abdominal distention from ascites slightly worse no without tension or pain on exam.  Physical Exam: Vitals:   04/25/22 1351 04/25/22 2025 04/26/22 0413 04/26/22 1403  BP: (!) 144/83 (!) 152/78 (!) 146/92 (!) 157/90  Pulse: 88 93 97 93  Resp: _0 Temp: 98.5 F (36.9 C) 98.2 F (36.8 C)  98.2 F (36.8 C) 98.3 F (36.8 C)  TempSrc: Oral  Oral   SpO2: 100% 100% (!) 63% 100%  Weight:      Height:       General exam: Alert, awake, oriented x 3; following commands appropriately, expressing 3-4 bowel movements and denying chest pain or  shortness of breath.  Patient is afebrile.  Expressive increase distention in his abdomen from ascites. Respiratory system: No wheezing, no crackles, no using accessory muscles.  2 L nasal cannula in place. Cardiovascular system:RRR. No rubs or gallops. Gastrointestinal system: Abdomen is distended from ascites but not tense and nontender. No organomegaly or masses felt. Normal bowel sounds heard. Central nervous system: Alert and oriented. No focal neurological deficits. Extremities: No cyanosis or clubbing; trace to 1+ edema appreciated bilaterally. Skin: No petechiae. Psychiatry: Judgement and insight appear normal. Mood & affect appropriate.   Data Reviewed: CBGs: 150-200 range. CBC: WBCs 5.2, hemoglobin 9.3 and platelets count 89 K Complete metabolic panel: Sodium 672, potassium 3.7, chloride 94, bicarb 23, glucose 152, BUN 36, creatinine 2.90 AST 46, ALT 60, alkaline phosphatase 289 Last MELD score 27 calculated.  Family Communication: Wife at bedside.  Disposition: Status is: Inpatient Remains inpatient appropriate because: Continue treatment for healthcare associated pneumonia (meeting criteria for sepsis at time of admission) and bacteremia.   Planned Discharge Destination: Home   Time spent: 35 minutes  Author: Barton Dubois, MD 04/26/2022 5:08 PM  For on call review [email or web address].

## 2022-04-27 ENCOUNTER — Encounter (HOSPITAL_COMMUNITY): Payer: Self-pay | Admitting: Family Medicine

## 2022-04-27 ENCOUNTER — Inpatient Hospital Stay (HOSPITAL_COMMUNITY): Payer: Medicare Other

## 2022-04-27 DIAGNOSIS — K7031 Alcoholic cirrhosis of liver with ascites: Secondary | ICD-10-CM | POA: Diagnosis not present

## 2022-04-27 DIAGNOSIS — A419 Sepsis, unspecified organism: Secondary | ICD-10-CM | POA: Diagnosis not present

## 2022-04-27 DIAGNOSIS — J189 Pneumonia, unspecified organism: Secondary | ICD-10-CM | POA: Diagnosis not present

## 2022-04-27 DIAGNOSIS — N186 End stage renal disease: Secondary | ICD-10-CM | POA: Diagnosis not present

## 2022-04-27 LAB — RENAL FUNCTION PANEL
Albumin: 2.1 g/dL — ABNORMAL LOW (ref 3.5–5.0)
Anion gap: 13 (ref 5–15)
BUN: 45 mg/dL — ABNORMAL HIGH (ref 8–23)
CO2: 23 mmol/L (ref 22–32)
Calcium: 8 mg/dL — ABNORMAL LOW (ref 8.9–10.3)
Chloride: 91 mmol/L — ABNORMAL LOW (ref 98–111)
Creatinine, Ser: 3.76 mg/dL — ABNORMAL HIGH (ref 0.61–1.24)
GFR, Estimated: 16 mL/min — ABNORMAL LOW (ref >60)
Glucose, Bld: 214 mg/dL — ABNORMAL HIGH (ref 70–99)
Phosphorus: 6 mg/dL — ABNORMAL HIGH (ref 2.5–4.6)
Potassium: 4.3 mmol/L (ref 3.5–5.1)
Sodium: 127 mmol/L — ABNORMAL LOW (ref 135–145)

## 2022-04-27 LAB — CBC
HCT: 31.2 % — ABNORMAL LOW (ref 39.0–52.0)
Hemoglobin: 9.9 g/dL — ABNORMAL LOW (ref 13.0–17.0)
MCH: 31.1 pg (ref 26.0–34.0)
MCHC: 31.7 g/dL (ref 30.0–36.0)
MCV: 98.1 fL (ref 80.0–100.0)
Platelets: 110 10*3/uL — ABNORMAL LOW (ref 150–400)
RBC: 3.18 MIL/uL — ABNORMAL LOW (ref 4.22–5.81)
RDW: 19.6 % — ABNORMAL HIGH (ref 11.5–15.5)
WBC: 4.4 10*3/uL (ref 4.0–10.5)
nRBC: 0 % (ref 0.0–0.2)

## 2022-04-27 LAB — GLUCOSE, CAPILLARY
Glucose-Capillary: 87 mg/dL (ref 70–99)
Glucose-Capillary: 137 mg/dL — ABNORMAL HIGH (ref 70–99)
Glucose-Capillary: 211 mg/dL — ABNORMAL HIGH (ref 70–99)
Glucose-Capillary: 146 mg/dL — ABNORMAL HIGH (ref 70–99)

## 2022-04-27 NOTE — Assessment & Plan Note (Signed)
-  E. coli and Enterococcus bacteremia appreciated 4 out of 4 -Source of infection appears to be urinary tract (patient with chronic indwelling catheter; correlating with UTI). -ID curbside decision made to treat for a total of 10 days. -Patient responding adequately to current antibiotic -Case discussed also with nephrology and unable to provide treatment at his dialysis unit as an outpatient. -Continue current antibiotics and follow clinical response.

## 2022-04-27 NOTE — Progress Notes (Signed)
Patient ID: Jeffrey [name], male   DOB: 1946-05-07, 76 y.o.   MRN: [id number]  [facility] Progress Note    Subjective:    Feeling sleepy this morning.     Objective:   BP 131/64 (BP Location: Right Arm)   Pulse 83   Temp (!) 97.4 F (36.3 C) (Oral)   Resp 18   Ht '5\' 7"'$  (1.702 m)   Wt 85.5 kg   SpO2 100%   BMI 29.52 kg/m   Intake/Output: I/O last 3 completed shifts: In: 600 [P.O.:600] Out: 400 [Urine:400]   Intake/Output this shift:  No intake/output data recorded. Weight change:   Physical Exam: Gen: NAD CVS: RRR Resp:CTA Abd: +BS, soft, NT/ND Ext: no edema, LUE AVF +T/B  Labs: BMET Recent Labs  Lab 04/22/22 1940 04/23/22 0239 04/24/22 1012 04/26/22 0412  NA 132* 132* 132* 131*  K 2.8* 3.0* 3.5 3.7  CL 92* 98 93* 94*  CO2 '25 23 22 23  '$ GLUCOSE 129* 114* 206* 152*  BUN 20 23 39* 36*  CREATININE 2.05* 2.23* 3.17* 2.90*  ALBUMIN 2.4* 2.0* 2.3* 2.2*  CALCIUM 8.1* 7.4* 8.6* 8.3*   CBC Recent Labs  Lab 04/21/22 0846 04/22/22 1940 04/23/22 0239 04/23/22 1543 04/24/22 1012 04/26/22 0412  WBC 3.5* 2.6* 6.8  --  11.2* 5.2  NEUTROABS 2.8 2.5 6.3  --   --   --   HGB 6.7* 8.0* 6.8* 9.4* 9.6* 9.3*  HCT 21.6* 25.0* 22.2* 29.8* 30.3* 29.3*  MCV 105.9* 101.2* 104.7*  --  98.4 98.0  PLT 122* 117* 79*  --  110* 89*      Medications:     Chlorhexidine Gluconate Cloth  6 each Topical Q0600   Chlorhexidine Gluconate Cloth  6 each Topical Q0600   darbepoetin (ARANESP) injection - DIALYSIS  200 mcg Subcutaneous Q Fri-1800   famotidine  10 mg Oral BID   hydrOXYzine  25 mg Oral TID   insulin aspart  0-15 Units Subcutaneous TID WC   insulin aspart  0-5 Units Subcutaneous QHS   insulin detemir  5 Units Subcutaneous QHS   lactulose  10 g Oral TID   metoprolol succinate  25 mg Oral Daily   mirtazapine  30 mg Oral QHS   pantoprazole (PROTONIX) IV  40 mg Intravenous Q12H   pravastatin  20 mg Oral Daily   rifaximin  550 mg Oral BID   venlafaxine  75  mg Oral TID WC   zinc sulfate  220 mg Oral Daily   OP orders-  [facility] MWF  4 hours  EDW 79.5 HD Bath 2/2.5, Dialyzer unknown, Heparin no. Access L UE AVF.- 350 BFR-  gets mircera 200 q 2 weeks given on 11/13, last hgb there was on 11/13 7.9    Assessment/ Plan:   Sepsis due to pneumonia - + blood cultures for GNR, cultues + for E. Coli and enterobacterales.  Currently on meropenem per primary svc. ESRD- continue with HD on MWF schedule Anemia: recent blood transfusion, on ESA.  Continue to follow. 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 DM type 2 - per primary COPD - stable  Donetta Potts, MD [place] 04/27/2022, 8:54 AM

## 2022-04-27 NOTE — Progress Notes (Signed)
Progress Note   Patient: Jeffrey [name] DQQ:[id number] DOB: 15-Aug-1945 DOA: 04/22/2022     5 DOS: the patient was seen and examined on 04/27/2022   Brief hospital course: As per H&P written by Dr.Zierle-Ghosh on 04/23/22 Jeffrey [name] is a 76 y.o. male with medical history significant of atrial fibrillation, alcoholic cirrhosis, anxiety, chronic kidney disease, diabetes mellitus type 2, GERD, esophageal varices, hypertension, hard of hearing, and more presents the ED with a chief complaint of fever.  Unfortunately at the time of my exam, patient cannot stay awake long enough to answer questions.  I think this is less likely acute metabolic encephalopathy and more likely the fact that it is the middle of the night.  In any event adding on VBG and ammonia to this morning's labs.  In the ED patient's wife had told them that the patient had had a fever of 101 at home.  They decided to come to the hospital after a dose of Tylenol did not relieve the fever.  Patient had been in the oncology clinic yesterday.  History of iron deficiency anemia was transfused secondary to hemoglobin of 6.7.  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  He has had history of GI bleed recently in September.  He had a apparently complained of continued melena at the heme-onc clinic as well.   Patient has been both DNR and full code in past hospitalizations.  On his most recent H&P he was listed as full code so we will continue with that.  Assessment and Plan: * Sepsis due to pneumonia ([place]) - Febrile up to 103, tacky up to 95, tachypneic up to 27, 2.6, lactic acid 4.6 at time of admission; Patient met criteria for sepsis. -Positive blood cultures for gram-negative rods bacteremia and chest x-ray demonstrating pneumonia. -Blood cultures have turned out to be positive for gram-negative rods 4 out of 4; antibiotics adjusted to meropenem (as part of treatment for bacteremia). -Continue to follow clinical response -Continue the use of  flutter valve and bronchodilator management. -Patient was treated with vancomycin and meropenem; completed 5 days of antibiotic therapy. -MRSA PCR negative; vancomycin will be discontinued.   ESRD on dialysis [facility]) - Monday, Wednesday and Friday outpatient HD schedule -Plan for hemodialysis again later today 04/27/2022.   -Appears to be stable overall. -Follow nephrology recommendations  Chronic respiratory failure with hypoxia (HCC) - On 2-3 L nasal cannula at baseline - Currently maintaining oxygen sats on the same supplementation. - Continue as needed bronchodilator management. -Reports breathing is essentially back to baseline currently.  Hypokalemia - Continue to follow electrolytes trend -Continue hemodialysis to further stabilize electrolytes.  Gastroesophageal reflux disease -Continue PPI twice a day.  Bacteremia -E. coli and Enterococcus bacteremia appreciated 4 out of 4 -Source of infection appears to be urinary tract (patient with chronic indwelling catheter; correlating with UTI). -ID curbside decision made to treat for a total of 10 days. -Patient responding adequately to current antibiotic -Case discussed also with nephrology and unable to provide treatment at his dialysis unit as an outpatient. -Continue current antibiotics and follow clinical response.  Controlled type 2 diabetes mellitus with chronic kidney disease on chronic dialysis, with long-term current use of insulin (HCC) -Continue sliding scale insulin -A1c 6.1 -Follow CBGs fluctuation.  Alcoholic cirrhosis (HCC) -Continue rifaximin -Stable mentation currently. -GI service on board and will follow any further recommendations. - Continue PPI twice a day. -Continue the use of lactulose with intention to achieve 2-4 bowel movement daily -Paracentesis recommended on Monday.  Essential hypertension, benign - Blood pressure overall stable -Continue metoprolol -Follow-up vital signs.   Subjective:   Afebrile, no chest pain, no nausea or vomiting.  Status post paracentesis; so far well-tolerated and with 3.5 L removed.  Patient is overall feeling better.  Following commands appropriately and in no acute distress.  Physical Exam: Vitals:   04/27/22 1645 04/27/22 1700 04/27/22 1730 04/27/22 1800  BP: (!) 160/81 (!) 152/78 (!) 149/74 (!) (P) 147/75  Pulse:      Resp: _0 (P) 14  Temp:      TempSrc:      SpO2:      Weight:      Height:       General exam: Alert, awake, oriented x 3; no chest pain, no nausea, no vomiting, no fever.  Reports feeling better after paracentesis. Respiratory system: Improved air movement bilaterally; no using accessory muscle.  Positive scattered rhonchi.  No wheezing Cardiovascular system:RRR. No rubs or gallops. Gastrointestinal system: Abdomen is less distended, nontender, positive bowel sounds on exam.   Central nervous system: Alert and oriented. No focal neurological deficits. Extremities: No cyanosis or clubbing; trace to 1+ edema appreciated bilaterally.  Left upper extremity with dialysis access demonstrating good bruit. Skin: No petechiae. Psychiatry: Judgement and insight appear normal. Mood & affect appropriate.   Data Reviewed: Status post paracentesis: 3.5 L fluid removed without complications (46/65/9935) CBC: WBCs 4.2, hemoglobin 9.9 and platelet count 110 K Renal function panel: Sodium 127, potassium 4.3, chloride 91, bicarb 23, BUN 45, creatinine 3.76, albumin 2.1, phosphorus 6.0 and calcium 8.0 Last MELD score 27 calculated.  Family Communication: Wife at bedside.  Disposition: Status is: Inpatient Remains inpatient appropriate because: Continue treatment for healthcare associated pneumonia (meeting criteria for sepsis at time of admission) and bacteremia.   Planned Discharge Destination: Home   Time spent: 35 minutes  Author: Barton Dubois, MD 04/27/2022 6:21 PM  For on call review [email or web address].

## 2022-04-27 NOTE — Progress Notes (Signed)
Paracentesis complete no signs of distress.  

## 2022-04-27 NOTE — Procedures (Signed)
   HEMODIALYSIS TREATMENT NOTE:   Uneventful 4 hour heparin-free treatment completed using left upper arm AVF (15g/antegrade). Goal met: 3.7 liters removed without interruption in UF.  All blood was returned.  Meropenem given during last 30 minutes.  No changes from pre-HD assessment.  Hand-off given to [employer], LPN.   Rockwell Alexandria, RN

## 2022-04-28 DIAGNOSIS — K7031 Alcoholic cirrhosis of liver with ascites: Secondary | ICD-10-CM | POA: Diagnosis not present

## 2022-04-28 DIAGNOSIS — J189 Pneumonia, unspecified organism: Secondary | ICD-10-CM | POA: Diagnosis not present

## 2022-04-28 DIAGNOSIS — A419 Sepsis, unspecified organism: Secondary | ICD-10-CM | POA: Diagnosis not present

## 2022-04-28 DIAGNOSIS — N186 End stage renal disease: Secondary | ICD-10-CM | POA: Diagnosis not present

## 2022-04-28 LAB — GLUCOSE, CAPILLARY
Glucose-Capillary: 158 mg/dL — ABNORMAL HIGH (ref 70–99)
Glucose-Capillary: 281 mg/dL — ABNORMAL HIGH (ref 70–99)
Glucose-Capillary: 122 mg/dL — ABNORMAL HIGH (ref 70–99)
Glucose-Capillary: 199 mg/dL — ABNORMAL HIGH (ref 70–99)
Glucose-Capillary: 196 mg/dL — ABNORMAL HIGH (ref 70–99)

## 2022-04-28 LAB — HEPATITIS B SURFACE ANTIBODY, QUANTITATIVE: Hep B S AB Quant (Post): 4.8 m[IU]/mL — ABNORMAL LOW (ref >9.9)

## 2022-04-28 NOTE — Progress Notes (Signed)
Patient ID: Jeffrey [name], male   DOB: 21-Dec-1945, 76 y.o.   MRN: [id number] S: Feels well, no complaints. O:BP 121/69 (BP Location: Right Arm)   Pulse 88   Temp 98.6 F (37 C) (Oral)   Resp 18   Ht '5\' 7"'$  (1.702 m)   Wt 85.9 kg   SpO2 100%   BMI 29.66 kg/m   Intake/Output Summary (Last 24 hours) at 04/28/2022 1250 Last data filed at 04/27/2022 2015 Gross per 24 hour  Intake --  Output 3700 ml  Net -3700 ml   Intake/Output: I/O last 3 completed shifts: In: -  Out: 3950 [Urine:250; Other:3700]  Intake/Output this shift:  No intake/output data recorded. Weight change: 0.4 kg Gen: NAD CVS: RRR Resp:CTA Abd: +BS,soft, NT/ND Ext: trace pretibial edema, LUE AVF +T/B  Recent Labs  Lab 04/22/22 1940 04/23/22 0239 04/24/22 1012 04/26/22 0412 04/27/22 1600  NA 132* 132* 132* 131* 127*  K 2.8* 3.0* 3.5 3.7 4.3  CL 92* 98 93* 94* 91*  CO2 '25 23 22 23 23  '$ GLUCOSE 129* 114* 206* 152* 214*  BUN 20 23 39* 36* 45*  CREATININE 2.05* 2.23* 3.17* 2.90* 3.76*  ALBUMIN 2.4* 2.0* 2.3* 2.2* 2.1*  CALCIUM 8.1* 7.4* 8.6* 8.3* 8.0*  PHOS  --   --   --   --  6.0*  AST 43* 52* 132* 46*  --   ALT 27 26 90* 60*  --    Liver Function Tests: Recent Labs  Lab 04/23/22 0239 04/24/22 1012 04/26/22 0412 04/27/22 1600  AST 52* 132* 46*  --   ALT 26 90* 60*  --   ALKPHOS 235* 267* 289*  --   BILITOT 1.0 0.8 0.8  --   PROT 5.0* 6.0* 5.9*  --   ALBUMIN 2.0* 2.3* 2.2* 2.1*   No results for input(s): "LIPASE", "AMYLASE" in the last 168 hours. Recent Labs  Lab 04/23/22 0239  AMMONIA 78*   CBC: Recent Labs  Lab 04/22/22 1940 04/23/22 0239 04/23/22 1543 04/24/22 1012 04/26/22 0412 04/27/22 1600  WBC 2.6* 6.8  --  11.2* 5.2 4.4  NEUTROABS 2.5 6.3  --   --   --   --   HGB 8.0* 6.8*   < > 9.6* 9.3* 9.9*  HCT 25.0* 22.2*   < > 30.3* 29.3* 31.2*  MCV 101.2* 104.7*  --  98.4 98.0 98.1  PLT 117* 79*  --  110* 89* 110*   < > = values in this interval not displayed.   Cardiac  Enzymes: No results for input(s): "CKTOTAL", "CKMB", "CKMBINDEX", "TROPONINI" in the last 168 hours. CBG: Recent Labs  Lab 04/27/22 1155 04/27/22 1603 04/27/22 2054 04/28/22 0750 04/28/22 1152  GLUCAP 137* 211* 146* 158* 281*    Iron Studies: No results for input(s): "IRON", "TIBC", "TRANSFERRIN", "FERRITIN" in the last 72 hours. Studies/Results: US Paracentesis  Result Date: 04/27/2022 INDICATION: Ascites. EXAM: ULTRASOUND GUIDED therapeutic PARACENTESIS MEDICATIONS: None. COMPLICATIONS: None immediate. PROCEDURE: Informed written consent was obtained from the patient after a discussion of the risks, benefits and alternatives to treatment. A timeout was performed prior to the initiation of the procedure. Initial ultrasound scanning demonstrates a large amount of ascites within the left lower abdominal quadrant. The right lower abdomen was prepped and draped in the usual sterile fashion. 1% lidocaine was used for local anesthesia. Following this, a paracentesis catheter was introduced. An ultrasound image was saved for documentation purposes. The paracentesis was performed. The catheter was removed and a  dressing was applied. The patient tolerated the procedure well without immediate post procedural complication. FINDINGS: A total of approximately 3.5 L of serous fluid was removed. IMPRESSION: Successful ultrasound-guided paracentesis yielding 3.5 liters of peritoneal fluid. Electronically Signed   By: Marijo Conception M.D.   On: 04/27/2022 10:28    Chlorhexidine Gluconate Cloth  6 each Topical Q0600   Chlorhexidine Gluconate Cloth  6 each Topical Q0600   darbepoetin (ARANESP) injection - DIALYSIS  200 mcg Subcutaneous Q Fri-1800   famotidine  10 mg Oral BID   hydrOXYzine  25 mg Oral TID   insulin aspart  0-15 Units Subcutaneous TID WC   insulin aspart  0-5 Units Subcutaneous QHS   insulin detemir  5 Units Subcutaneous QHS   lactulose  10 g Oral TID   metoprolol succinate  25 mg Oral Daily    mirtazapine  30 mg Oral QHS   pantoprazole (PROTONIX) IV  40 mg Intravenous Q12H   pravastatin  20 mg Oral Daily   rifaximin  550 mg Oral BID   venlafaxine  75 mg Oral TID WC   zinc sulfate  220 mg Oral Daily    BMET    Component Value Date/Time   NA 127 (L) 04/27/2022 1600   K 4.3 04/27/2022 1600   CL 91 (L) 04/27/2022 1600   CO2 23 04/27/2022 1600   GLUCOSE 214 (H) 04/27/2022 1600   BUN 45 (H) 04/27/2022 1600   CREATININE 3.76 (H) 04/27/2022 1600   CREATININE 2.65 (H) 11/16/2019 1203   CALCIUM 8.0 (L) 04/27/2022 1600   CALCIUM 8.3 (L) 09/19/2020 1024   GFRNONAA 16 (L) 04/27/2022 1600   GFRNONAA 23 (L) 11/16/2019 1203   GFRAA 15 (L) 02/20/2020 0636   GFRAA 27 (L) 11/16/2019 1203   CBC    Component Value Date/Time   WBC 4.4 04/27/2022 1600   RBC 3.18 (L) 04/27/2022 1600   HGB 9.9 (L) 04/27/2022 1600   HCT 31.2 (L) 04/27/2022 1600   HCT 28 02/25/2012 1034   PLT 110 (L) 04/27/2022 1600   PLT 125 02/25/2012 1034   MCV 98.1 04/27/2022 1600   MCV 82.0 02/25/2012 1034   MCH 31.1 04/27/2022 1600   MCHC 31.7 04/27/2022 1600   RDW 19.6 (H) 04/27/2022 1600   LYMPHSABS 0.1 (L) 04/23/2022 0239   MONOABS 0.3 04/23/2022 0239   EOSABS 0.0 04/23/2022 0239   BASOSABS 0.0 04/23/2022 0239    OP orders-  [facility] MWF  4 hours  EDW 79.5 HD Bath 2/2.5, Dialyzer unknown, Heparin no. Access L UE AVF.- 350 BFR-  gets mircera 200 q 2 weeks given on 11/13, last hgb there was on 11/13 7.9     Assessment/ Plan:   Sepsis due to pneumonia - + blood cultures for GNR, cultues + for E. Coli and enterobacterales.  Currently on meropenem per primary svc.  Unfortunately, the organisms are not susceptible to [place] or Ancef so will need to remain as an impatient to receive meropenem per ID. ESRD- continue with HD on MWF schedule Anemia: recent blood transfusion, on ESA.  Continue to follow. 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 DM type 2 - per primary COPD - stable  Donetta Potts, MD [facility]

## 2022-04-28 NOTE — Progress Notes (Signed)
Vitals stable, pt slept through the night. +1 edema present in lower extremities, bilaterally.

## 2022-04-28 NOTE — Progress Notes (Signed)
Progress Note   Patient: Jeffrey [name] EXN:[id number] DOB: August 16, 1945 DOA: 04/22/2022     6 DOS: the patient was seen and examined on 04/28/2022   Brief hospital course: As per H&P written by Dr.Zierle-Ghosh on 04/23/22 Jeffrey [name] is a 76 y.o. male with medical history significant of atrial fibrillation, alcoholic cirrhosis, anxiety, chronic kidney disease, diabetes mellitus type 2, GERD, esophageal varices, hypertension, hard of hearing, and more presents the ED with a chief complaint of fever.  Unfortunately at the time of my exam, patient cannot stay awake long enough to answer questions.  I think this is less likely acute metabolic encephalopathy and more likely the fact that it is the middle of the night.  In any event adding on VBG and ammonia to this morning's labs.  In the ED patient's wife had told them that the patient had had a fever of 101 at home.  They decided to come to the hospital after a dose of Tylenol did not relieve the fever.  Patient had been in the oncology clinic yesterday.  History of iron deficiency anemia was transfused secondary to hemoglobin of 6.7.  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  He has had history of GI bleed recently in September.  He had a apparently complained of continued melena at the heme-onc clinic as well.   Patient has been both DNR and full code in past hospitalizations.  On his most recent H&P he was listed as full code so we will continue with that.  Assessment and Plan: * Sepsis due to pneumonia ([place]) - Febrile up to 103, tacky up to 95, tachypneic up to 27, 2.6, lactic acid 4.6 at time of admission; Patient met criteria for sepsis. -Positive blood cultures for gram-negative rods bacteremia and chest x-ray demonstrating pneumonia. -Blood cultures have turned out to be positive for gram-negative rods 4 out of 4; antibiotics adjusted to meropenem (as part of treatment for bacteremia). -Continue to follow clinical response -Continue the use of  flutter valve and bronchodilator management. -Patient was treated with vancomycin and meropenem; he has completed 5 days of antibiotic therapy. -MRSA PCR negative; vancomycin discontinued. -I discussed with infectious disease plan is for a total of 10 days; patient will require 5 more days of adjustment of pending while inpatient.   ESRD on dialysis [facility]) - Monday, Wednesday and Friday outpatient HD schedule -Well-tolerated dialysis on 04/27/2022; next treatment anticipated for 04/29/2022. -Appears to be stable overall. -Continue to follow nephrology recommendations  Chronic respiratory failure with hypoxia (HCC) - On 2-3 L nasal cannula at baseline - Currently maintaining oxygen sats on the same supplementation. - Continue as needed bronchodilator management. -Reports breathing is essentially back to baseline currently.  Hypokalemia - Continue to follow electrolytes trend -Continue hemodialysis to further stabilize electrolytes.  Gastroesophageal reflux disease -Continue PPI twice a day.  Bacteremia -E. coli and Enterococcus bacteremia appreciated 4 out of 4 -Source of infection appears to be urinary tract (patient with chronic indwelling catheter; correlating with UTI). -ID curbside decision made to treat for a total of 10 days.  (5 more days pending). -Patient responding adequately to current antibiotic -Case discussed also with nephrology and unable to provide treatment at his dialysis unit as an outpatient. -Continue current antibiotics and follow clinical response.  Controlled type 2 diabetes mellitus with chronic kidney disease on chronic dialysis, with long-term current use of insulin (HCC) -Continue sliding scale insulin -A1c 6.1 -Follow CBGs fluctuation.  Alcoholic cirrhosis (HCC) -Continue rifaximin -Stable mentation currently. -GI  service on board and will follow any further recommendations. - Continue PPI twice a day. -Continue the use of lactulose with  intention to achieve 2-4 bowel movement daily -Status post paracentesis on 04/27/2022; 3.5 L removed.  Well-tolerated.  Essential hypertension, benign - Blood pressure overall stable -Continue metoprolol -Follow-up vital signs.   Subjective:  Afebrile, no chest pain, no nausea, no vomiting.  Reports no shortness of breath.  Good saturation on chronic supplementation.  No overnight events.  Physical Exam: Vitals:   04/27/22 2015 04/27/22 2116 04/28/22 0522 04/28/22 1411  BP: (!) 154/70 134/66 121/69 130/64  Pulse: 84 84 88 88  Resp: _0 Temp: 98 F (36.7 C) 98 F (36.7 C) 98.6 F (37 C) 98.4 F (36.9 C)  TempSrc: Oral Oral Oral Oral  SpO2: 94% 100% 100% 100%  Weight:      Height:      General exam: Alert, awake, oriented x 3; no overnight events; following commands appropriately.  Reports no shortness of breath or chest pain.  Tolerating well dialysis and paracentesis on 04/27/2022. Respiratory system: Improved air movement bilaterally; no using accessory muscle.  Good saturation on chronic supplementation. Cardiovascular system:RRR. No rubs or gallops. Gastrointestinal system: Abdomen is mildly distended, no guarding, overall soft and with positive bowel sounds. Central nervous system: Alert and oriented. No focal neurological deficits. Extremities: No cyanosis or clubbing; trace edema appreciated bilaterally. Skin: No petechiae. Psychiatry: Judgement and insight appear normal. Mood & affect appropriate.   Data Reviewed: (not new labs for today) Status post paracentesis: 3.5 L fluid removed without complications (26/94/8546) CBC: WBCs 4.2, hemoglobin 9.9 and platelet count 110 K Renal function panel: Sodium 127, potassium 4.3, chloride 91, bicarb 23, BUN 45, creatinine 3.76, albumin 2.1, phosphorus 6.0 and calcium 8.0 Last MELD score 27 calculated.  Family Communication: No family at bedside on today's evaluation.  Disposition: Status is: Inpatient Remains  inpatient appropriate because: Continue treatment for healthcare associated pneumonia (meeting criteria for sepsis at time of admission) and bacteremia.   Planned Discharge Destination: Home   Time spent: 35 minutes  Author: Barton Dubois, MD 04/28/2022 5:33 PM  For on call review [email or web address].

## 2022-04-29 DIAGNOSIS — J189 Pneumonia, unspecified organism: Secondary | ICD-10-CM | POA: Diagnosis not present

## 2022-04-29 DIAGNOSIS — A419 Sepsis, unspecified organism: Secondary | ICD-10-CM | POA: Diagnosis not present

## 2022-04-29 LAB — RENAL FUNCTION PANEL
Albumin: 2.2 g/dL — ABNORMAL LOW (ref 3.5–5.0)
Anion gap: 15 (ref 5–15)
BUN: 40 mg/dL — ABNORMAL HIGH (ref 8–23)
CO2: 23 mmol/L (ref 22–32)
Calcium: 8.4 mg/dL — ABNORMAL LOW (ref 8.9–10.3)
Chloride: 91 mmol/L — ABNORMAL LOW (ref 98–111)
Creatinine, Ser: 3.71 mg/dL — ABNORMAL HIGH (ref 0.61–1.24)
GFR, Estimated: 16 mL/min — ABNORMAL LOW (ref >60)
Glucose, Bld: 123 mg/dL — ABNORMAL HIGH (ref 70–99)
Phosphorus: 5.4 mg/dL — ABNORMAL HIGH (ref 2.5–4.6)
Potassium: 4.6 mmol/L (ref 3.5–5.1)
Sodium: 129 mmol/L — ABNORMAL LOW (ref 135–145)

## 2022-04-29 LAB — GLUCOSE, CAPILLARY
Glucose-Capillary: 124 mg/dL — ABNORMAL HIGH (ref 70–99)
Glucose-Capillary: 98 mg/dL (ref 70–99)
Glucose-Capillary: 120 mg/dL — ABNORMAL HIGH (ref 70–99)
Glucose-Capillary: 157 mg/dL — ABNORMAL HIGH (ref 70–99)

## 2022-04-29 LAB — CBC
HCT: 30.5 % — ABNORMAL LOW (ref 39.0–52.0)
Hemoglobin: 9.7 g/dL — ABNORMAL LOW (ref 13.0–17.0)
MCH: 31.1 pg (ref 26.0–34.0)
MCHC: 31.8 g/dL (ref 30.0–36.0)
MCV: 97.8 fL (ref 80.0–100.0)
Platelets: 113 10*3/uL — ABNORMAL LOW (ref 150–400)
RBC: 3.12 MIL/uL — ABNORMAL LOW (ref 4.22–5.81)
RDW: 19.6 % — ABNORMAL HIGH (ref 11.5–15.5)
WBC: 3.7 10*3/uL — ABNORMAL LOW (ref 4.0–10.5)
nRBC: 0 % (ref 0.0–0.2)

## 2022-04-29 MED ORDER — PANTOPRAZOLE SODIUM 40 MG PO TBEC
40.0000 mg | DELAYED_RELEASE_TABLET | Freq: Two times a day (BID) | ORAL | Status: DC
  Administered 2022-04-29 – 2022-05-02 (×6): 40 mg via ORAL
  Filled 2022-04-29 (×6): qty 1

## 2022-04-29 NOTE — Progress Notes (Signed)
Decreased appetite today. Patient states he feels like he has to urinate and complains with stomach bothering him. Xanax given x2 today MD informed. Orders put in for blood cultures to be repeated. Will continue to monitor patient. Call light within reach. Bed in lowest position.

## 2022-04-29 NOTE — Progress Notes (Addendum)
Progress Note   Patient: Jeffrey [name] YFR:[id number] DOB: 28-May-1946 DOA: 04/22/2022     7 DOS: the patient was seen and examined on 04/29/2022   Brief hospital course: As per H&P written by Dr.Zierle-Ghosh on 04/23/22 Jeffrey [name] is a 76 y.o. male with medical history significant of atrial fibrillation, alcoholic cirrhosis, anxiety, chronic kidney disease, diabetes mellitus type 2, GERD, esophageal varices, hypertension, hard of hearing, and more presents the ED with a chief complaint of fever.  Unfortunately at the time of my exam, patient cannot stay awake long enough to answer questions.  I think this is less likely acute metabolic encephalopathy and more likely the fact that it is the middle of the night.  In any event adding on VBG and ammonia to this morning's labs.  In the ED patient's wife had told them that the patient had had a fever of 101 at home.  They decided to come to the hospital after a dose of Tylenol did not relieve the fever.  Patient had been in the oncology clinic yesterday.  History of iron deficiency anemia was transfused secondary to hemoglobin of 6.7.  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  He has had history of GI bleed recently in September.  He had a apparently complained of continued melena at the heme-onc clinic as well.  Assessment and Plan: 1)ESBL E. coli bacteremia and sepsis due to urinary source--POA -Urine and blood cultures with ESBL E. Coli -Patient has chronic indwelling Foley catheter -Patient met sepsis criteria on admission --MRSA PCR negative; vancomycin discontinued. -Patient disease team recommends 10 days of IV meropenem through 05/02/2022 inclusive -Repeat blood cultures  2)ESRD on dialysis (HCC)--MWF - Nephrology input appreciated -Tolerated HD on 04/29/2022  3)Chronic respiratory failure with hypoxia (HCC) - On 2-3 L nasal cannula at baseline - Currently maintaining oxygen sats on the same supplementation. - Continue as needed  bronchodilator management. -Reports breathing is essentially back to baseline currently.  4)GERD -Continue Protonix  5)CAUTI--- ESBL E. coli UTI with bacteremia sepsis as above #1 the patient with chronic indwelling Foley catheter  6)DM2-  -Last A1c 6.1 reflecting excellent diabetic control PTA -Use Novolog/Humalog Sliding scale insulin with Accu-Cheks/Fingersticks as ordered   7)Alcoholic cirrhosis ([place]) -Continue rifaximin -GI input appreciated - Continue Protonix -Continue the use of lactulose with intention to achieve 2-4 bowel movement daily -Status post paracentesis on 04/27/2022; 3.5 L removed.  Well-tolerated.  8)Essential hypertension, benign - Blood pressure overall stable -Continue metoprolol -Follow-up vital signs.   Subjective:  -With nausea, vomiting -Fatigue and malaise -Tolerated hemodialysis okay  Physical Exam: Vitals:   04/29/22 1200 04/29/22 1245 04/29/22 1300 04/29/22 1301  BP: (!) 132/90 (!) 150/80  (!) 164/75  Pulse: 95 96  96  Resp: _0 Temp:  98.4 F (36.9 C)  97.8 F (36.6 C)  TempSrc:  Oral  Oral  SpO2: 100% 100%  100%  Weight:   78.3 kg   Height:        Physical Exam  Gen:- Awake Alert, in no acute distress  HEENT:- Hom[place].AT, No sclera icterus Neck-Supple Neck,No JVD,.  Lungs-  CTAB , fair air movement bilaterally  CV- S1, S2 normal, RRR Abd-  +ve B.Sounds, Abd Soft, No tenderness,    Extremity/Skin:- No  edema,   good pedal pulses  Psych-affect is appropriate, oriented x3 Neuro-no new focal deficits, no tremors MSK-left arm AV fistula positive thrill and bruit GU-chronic indwelling Foley   Family Communication: No family at bedside  on today's evaluation.  Disposition: Home after completing IV antibiotics Status is: Inpatient Remains inpatient appropriate because: Continue treatment for healthcare associated pneumonia (meeting criteria for sepsis at time of admission) and bacteremia.   Planned Discharge Destination:  Home  Author: Roxan Hockey, MD 04/29/2022 5:50 PM  For on call review [email or web address].

## 2022-04-29 NOTE — Progress Notes (Signed)
Patient slept well through the night, no c/o pain or discomfort voiced, assisted with bed side commode x2, patient had medium sized bowel movement. Patient has gross ascites and SOB with exertion. Gave frequent reminders for patient to use call for assistance to [facility]. Patient currently resting quietly with call bell in reach.

## 2022-04-29 NOTE — Procedures (Signed)
I was present at this dialysis session. I have reviewed the session itself and made appropriate changes.   Vital signs in last 24 hours:  Temp:  [98.2 F (36.8 C)-98.4 F (36.9 C)] 98.4 F (36.9 C) (11/29 0900) Pulse Rate:  [88-96] 96 (11/29 0933) Resp:  [17-22] 22 (11/29 0933) BP: (130-169)/(63-87) 156/87 (11/29 0933) SpO2:  [100 %] 100 % (11/29 0933) Weight:  [80.4 kg] 80.4 kg (11/29 0911) Weight change:  Filed Weights   04/27/22 0509 04/27/22 1539 04/29/22 0911  Weight: 85.5 kg 85.9 kg 80.4 kg    Recent Labs  Lab 04/27/22 1600  NA 127*  K 4.3  CL 91*  CO2 23  GLUCOSE 214*  BUN 45*  CREATININE 3.76*  CALCIUM 8.0*  PHOS 6.0*    Recent Labs  Lab 04/22/22 1940 04/23/22 0239 04/23/22 1543 04/24/22 1012 04/26/22 0412 04/27/22 1600  WBC 2.6* 6.8  --  11.2* 5.2 4.4  NEUTROABS 2.5 6.3  --   --   --   --   HGB 8.0* 6.8*   < > 9.6* 9.3* 9.9*  HCT 25.0* 22.2*   < > 30.3* 29.3* 31.2*  MCV 101.2* 104.7*  --  98.4 98.0 98.1  PLT 117* 79*  --  110* 89* 110*   < > = values in this interval not displayed.    Scheduled Meds:  Chlorhexidine Gluconate Cloth  6 each Topical Q0600   Chlorhexidine Gluconate Cloth  6 each Topical Q0600   darbepoetin (ARANESP) injection - DIALYSIS  200 mcg Subcutaneous Q Fri-1800   famotidine  10 mg Oral BID   hydrOXYzine  25 mg Oral TID   insulin aspart  0-15 Units Subcutaneous TID WC   insulin aspart  0-5 Units Subcutaneous QHS   insulin detemir  5 Units Subcutaneous QHS   lactulose  10 g Oral TID   metoprolol succinate  25 mg Oral Daily   mirtazapine  30 mg Oral QHS   pantoprazole (PROTONIX) IV  40 mg Intravenous Q12H   pravastatin  20 mg Oral Daily   rifaximin  550 mg Oral BID   venlafaxine  75 mg Oral TID WC   zinc sulfate  220 mg Oral Daily   Continuous Infusions:  meropenem (MERREM) IV 500 mg (04/28/22 1707)   PRN Meds:.acetaminophen **OR** acetaminophen, albuterol, ALPRAZolam, lidocaine (PF), lidocaine-prilocaine, ondansetron  **OR** ondansetron (ZOFRAN) IV, oxyCODONE, pentafluoroprop-tetrafluoroeth, polyethylene glycol   Donetta Potts,  MD 04/29/2022, 10:17 AM

## 2022-04-29 NOTE — Progress Notes (Signed)
Received patient in bed to unit.  Alert and oriented.  Informed consent signed and in chart.   Treatment initiated: 0900 Treatment completed: 0034  Patient tolerated well.  Transported back to the room  Alert, without acute distress.  Hand-off given to patient's nurse.   Access used: AVF Access issues: none  Total UF removed: 2.1   L Medication(s) given: Oxycodone 5 mg PO Post HD VS: 150/89 P 90 R 20 Post HD weight: 78.3 KG   [facility]

## 2022-04-30 ENCOUNTER — Inpatient Hospital Stay: Payer: Medicare Other

## 2022-04-30 DIAGNOSIS — J189 Pneumonia, unspecified organism: Secondary | ICD-10-CM | POA: Diagnosis not present

## 2022-04-30 DIAGNOSIS — A419 Sepsis, unspecified organism: Secondary | ICD-10-CM | POA: Diagnosis not present

## 2022-04-30 LAB — GLUCOSE, CAPILLARY
Glucose-Capillary: 73 mg/dL (ref 70–99)
Glucose-Capillary: 134 mg/dL — ABNORMAL HIGH (ref 70–99)
Glucose-Capillary: 214 mg/dL — ABNORMAL HIGH (ref 70–99)
Glucose-Capillary: 103 mg/dL — ABNORMAL HIGH (ref 70–99)

## 2022-04-30 MED ORDER — CHLORHEXIDINE GLUCONATE CLOTH 2 % EX PADS
6.0000 | MEDICATED_PAD | Freq: Every day | CUTANEOUS | Status: DC
  Administered 2022-04-30 – 2022-05-02 (×3): 6 via TOPICAL

## 2022-04-30 NOTE — Progress Notes (Signed)
Jeffrey [name] PROGRESS NOTE  Assessment/ Plan: Pt is a 76 y.o. yo male with history of A-fib, liver cirrhosis, anxiety, DM, HTN, ESRD on HD admitted with ESBL E. coli bacteremia and sepsis.  OP orders-  [facility] MWF  4 hours  EDW 79.5 HD [place] 2/2.5, Dialyzer unknown, Heparin no. Access L UE AVF.- 350 BFR-  gets mircera 200 q 2 weeks given on 11/13, last hgb there was on 11/13 7.9  #Sepsis due to pneumonia, ESBL E. coli bacteremia: Currently on IV meropenem for 10 days through 12/2.  Per primary team.  # ESRD MWF, status post HD yesterday with 2.1 L UF, tolerated well.  Plan for next HD tomorrow.  # Anemia: Hemoglobin around 9.5, we will dose of Aranesp with HD tomorrow.  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  It seems like he is not on binders.  # HTN/volume: Blood pressure and volume acceptable.  UF with HD.  Currently on metoprolol.  #Chronic hypoxic respiratory failure on 2 to 3 L of oxygen at home which is his baseline.  Subjective: Seen and examined at bedside.  Denies nausea, vomiting, chest pain, shortness of breath.  Discussed with the primary team. Objective Vital signs in last 24 hours: Vitals:   04/29/22 1300 04/29/22 1301 04/29/22 2111 04/30/22 0502  BP:  (!) 164/75 (!) 141/68 120/71  Pulse:  96 92 84  Resp:  '18 16 18  '$ Temp:  97.8 F (36.6 C) 97.8 F (36.6 C) 97.8 F (36.6 C)  TempSrc:  Oral Oral Oral  SpO2:  100% 100% 100%  Weight: 78.3 kg     Height:       Weight change:   Intake/Output Summary (Last 24 hours) at 04/30/2022 0937 Last data filed at 04/29/2022 1245 Gross per 24 hour  Intake --  Output 2300 ml  Net -2300 ml       Labs: RENAL PANEL Recent Labs    07/28/21 1307 07/29/21 0522 07/30/21 0517 08/10/21 1141 09/18/21 0830 02/02/22 1327 02/03/22 0333 02/04/22 0301 02/05/22 0306 02/06/22 0555 02/07/22 0519 02/19/22 1421 02/19/22 1558 02/20/22 0613 04/16/22 1315 04/22/22 1940 04/23/22 0239  04/24/22 1012 04/26/22 0412 04/27/22 1600 04/29/22 0930  NA 131* 133* 131* 132*   < > 133*   < > 133* 132* 132* 129* 130*  --  133* 133* 132* 132* 132* 131* 127* 129*  K 3.1* 3.4* 3.9 4.1   < > 4.2   < > 3.0* 3.1* 3.0* 3.7 3.0*  --  3.5 3.5 2.8* 3.0* 3.5 3.7 4.3 4.6  CL 98 99 95* 96*   < > 96*   < > 97* 96* 94* 94* 94*  --  99 95* 92* 98 93* 94* 91* 91*  CO2 21* '24 26 26   '$ < > 25   < > '26 26 29 26 25  '$ --  '24 27 25 23 22 23 23 23  '$ GLUCOSE 191* 96 68* 207*   < > 158*   < > 166* 231* 232* 239* 332*  --  132* 169* 129* 114* 206* 152* 214* 123*  BUN 38* 41* 20 23   < > 34*   < > 20 29* 22 35* 20  --  23 38* 20 23 39* 36* 45* 40*  CREATININE 4.06* 4.32* 2.71* 3.09*   < > 4.43*   < > 2.92* 4.07* 3.29* 4.31* 2.48*  --  2.89* 3.25* 2.05* 2.23* 3.17* 2.90* 3.76* 3.71*  CALCIUM 8.5* 8.8* 8.6* 9.9   < >  9.2   < > 8.0* 8.2* 8.2* 8.2* 8.2*  --  8.4* 8.3* 8.1* 7.4* 8.6* 8.3* 8.0* 8.4*  MG 1.5* 1.6* 1.7 1.8  --  1.7  --   --   --   --   --   --  1.7  --   --   --  1.8  --   --   --   --   PHOS  --   --  2.1*  --   --   --   --  3.0 4.6 3.4 4.5  --   --   --   --   --   --   --   --  6.0* 5.4*  ALBUMIN 2.7* 2.6* 3.0* 3.0*   < > 2.8*   < > 2.4* 2.5* 2.5* 2.6* 2.6*  --   --  2.6* 2.4* 2.0* 2.3* 2.2* 2.1* 2.2*   < > = values in this interval not displayed.     Liver Function Tests: Recent Labs  Lab 04/24/22 1012 04/26/22 0412 04/27/22 1600 04/29/22 0930  AST 132* 46*  --   --   ALT 90* 60*  --   --   ALKPHOS 267* 289*  --   --   BILITOT 0.8 0.8  --   --   PROT 6.0* 5.9*  --   --   ALBUMIN 2.3* 2.2* 2.1* 2.2*   No results for input(s): "LIPASE", "AMYLASE" in the last 168 hours. No results for input(s): "AMMONIA" in the last 168 hours. CBC: Recent Labs    07/03/21 0940 07/08/21 1323 09/18/21 0830 09/25/21 1031 12/30/21 1051 02/02/22 1327 04/16/22 1315 04/16/22 1318 04/23/22 1543 04/24/22 1012 04/26/22 0412 04/27/22 1600 04/29/22 0930  HGB 6.9*   < > 7.2*   < > 8.1*   < >  --    < > 9.4*  9.6* 9.3* 9.9* 9.7*  MCV 100.9*   < > 100.4*   < > 103.1*   < >  --    < >  --  98.4 98.0 98.1 97.8  VITAMINB12 1,391*  --  1,324*  --  991*  --  992*  --   --   --   --   --   --   FERRITIN 247  --  198  --  182  --  313  --   --   --   --   --   --   TIBC 271  --  252  --  285  --  256  --   --   --   --   --   --   IRON 62  --  35*  --  57  --  50  --   --   --   --   --   --    < > = values in this interval not displayed.    Cardiac Enzymes: No results for input(s): "CKTOTAL", "CKMB", "CKMBINDEX", "TROPONINI" in the last 168 hours. CBG: Recent Labs  Lab 04/29/22 0736 04/29/22 1327 04/29/22 1643 04/29/22 2128 04/30/22 0718  GLUCAP 124* 98 120* 157* 73    Iron Studies: No results for input(s): "IRON", "TIBC", "TRANSFERRIN", "FERRITIN" in the last 72 hours. Studies/Results: No results found.  Medications: Infusions:  meropenem (MERREM) IV 500 mg (04/29/22 1725)    Scheduled Medications:  Chlorhexidine Gluconate Cloth  6 each Topical Q0600   Chlorhexidine Gluconate Cloth  6 each Topical Q0600  darbepoetin (ARANESP) injection - DIALYSIS  200 mcg Subcutaneous Q Fri-1800   famotidine  10 mg Oral BID   hydrOXYzine  25 mg Oral TID   insulin aspart  0-15 Units Subcutaneous TID WC   insulin aspart  0-5 Units Subcutaneous QHS   insulin detemir  5 Units Subcutaneous QHS   lactulose  10 g Oral TID   metoprolol succinate  25 mg Oral Daily   mirtazapine  30 mg Oral QHS   pantoprazole  40 mg Oral BID AC   pravastatin  20 mg Oral Daily   rifaximin  550 mg Oral BID   venlafaxine  75 mg Oral TID WC   zinc sulfate  220 mg Oral Daily    have reviewed scheduled and prn medications.  Physical Exam: General:NAD, comfortable Heart:RRR, s1s2 nl Lungs:clear b/l, no crackle Abdomen:soft, Non-tender, non-distended Extremities:No edema Dialysis Access: Left upper extremity AV fistula has good thrill and bruit.  Jeffrey [name] Jeffrey [name] 04/30/2022,9:37 AM  LOS: 8 days

## 2022-04-30 NOTE — Evaluation (Signed)
Physical Therapy Evaluation Patient Details Name: Jeffrey [name] MRN: [id number] DOB: [birth year] Today's Date: 04/30/2022  History of Present Illness  Jeffrey [name] is a 76 y.o. male with medical history significant of atrial fibrillation, alcoholic cirrhosis, anxiety, chronic kidney disease, diabetes mellitus type 2, GERD, esophageal varices, hypertension, hard of hearing, and more presents the ED with a chief complaint of fever. In the ED patient's wife had told them that the patient had had a fever of 101 at home. They decided to come to the hospital after a dose of Tylenol did not relieve the fever. Patient had been in the oncology clinic yesterday. History of iron deficiency anemia was transfused secondary to hemoglobin of 6.7.  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  He has had history of GI bleed recently in September.   Clinical Impression  Patient able to sit up at bedside with min/hand held assist. Patient then able to transfer to chair and ambulate in hallway with RW/min guard, easily distracted but no loss of balance, limited mostly due to generalized weakness and fatigue. Patient tolerated sitting up in chair after therapy. Patient will benefit from continued skilled physical therapy in hospital and recommended venue below to increase strength, balance, endurance for safe ADLs and gait.      Recommendations for follow up therapy are one component of a multi-disciplinary discharge planning process, led by the attending physician.  Recommendations may be updated based on patient status, additional functional criteria and insurance authorization.  Follow Up Recommendations Home health PT      Assistance Recommended at Discharge Set up Supervision/Assistance  Patient can return home with the following  Help with stairs or ramp for entrance;Assistance with cooking/housework;A little help with bathing/dressing/bathroom;A little help with walking and/or transfers    Equipment Recommendations  None recommended by PT  Recommendations for Other Services       Functional Status Assessment Patient has not had a recent decline in their functional status     Precautions / Restrictions Precautions Precautions: Fall Restrictions Weight Bearing Restrictions: No      Mobility  Bed Mobility Overal bed mobility: Needs Assistance Bed Mobility: Supine to Sit     Supine to sit: Min assist     General bed mobility comments: min/hand held assist to elevate trunk    Transfers Overall transfer level: Needs assistance Equipment used: Rolling walker (2 wheels) Transfers: Sit to/from Stand, Bed to chair/wheelchair/BSC Sit to Stand: Min guard   Step pivot transfers: Min guard       General transfer comment: patient able to stand and transfer to chair with min guard/RW    Ambulation/Gait Ambulation/Gait assistance: Min guard Gait Distance (Feet): 35 Feet Assistive device: Rolling walker (2 wheels) Gait Pattern/deviations: Decreased step length - right, Decreased step length - left, Decreased stride length Gait velocity: decreased     General Gait Details: patient able to ambulate with slowed cadence in hallway with min guard/RW and no loss of balance, easily distracted, limited mostly due to generalized weakness and fatigue  Stairs            Wheelchair Mobility    Modified Rankin (Stroke Patients Only)       Balance Overall balance assessment: Needs assistance Sitting-balance support: No upper extremity supported, Feet supported Sitting balance-Leahy Scale: Good Sitting balance - Comments: seated EOB   Standing balance support: Bilateral upper extremity supported, During functional activity, Reliant on assistive device for balance Standing balance-Leahy Scale: Good Standing balance comment: with RW  Pertinent Vitals/Pain Pain Assessment Pain Assessment: No/denies pain    Home Living Family/patient expects to be  discharged to:: Private residence Living Arrangements: Spouse/significant other Available Help at Discharge: Family;Available 24 hours/day Type of Home: House Home Access: Ramped entrance       Home Layout: Able to live on main level with bedroom/bathroom Home Equipment: Rolling Walker (2 wheels);Rollator (4 wheels);Cane - single point;Shower seat - built in;Grab bars - tub/shower;BSC/3in1      Prior Function Prior Level of Function : Needs assist       Physical Assist : Mobility (physical);ADLs (physical) Mobility (physical): Stairs ADLs (physical): IADLs Mobility Comments: household ambulator using rollator, w/c for longer distances, uses 3 LPM O2 at home ADLs Comments: assisted by family     Hand Dominance   Dominant Hand: Right    Extremity/Trunk Assessment   Upper Extremity Assessment Upper Extremity Assessment: Generalized weakness    Lower Extremity Assessment Lower Extremity Assessment: Generalized weakness    Cervical / Trunk Assessment Cervical / Trunk Assessment: Kyphotic  Communication   Communication: No difficulties  Cognition Arousal/Alertness: Awake/alert Behavior During Therapy: WFL for tasks assessed/performed Overall Cognitive Status: Within Functional Limits for tasks assessed                                          General Comments      Exercises     Assessment/Plan    PT Assessment Patient needs continued PT services  PT Problem List Decreased strength;Decreased activity tolerance;Decreased balance;Decreased mobility       PT Treatment Interventions DME instruction;Balance training;Gait training;Stair training;Functional mobility training;Patient/family education;Therapeutic activities;Therapeutic exercise    PT Goals (Current goals can be found in the Care Plan section)  Acute Rehab PT Goals Patient Stated Goal: return home PT Goal Formulation: With patient Time For Goal Achievement: 05/14/22 Potential to  Achieve Goals: Good    Frequency Min 3X/week     Co-evaluation               AM-PAC PT "6 Clicks" Mobility  Outcome Measure Help needed turning from your back to your side while in a flat bed without using bedrails?: None Help needed moving from lying on your back to sitting on the side of a flat bed without using bedrails?: A Little Help needed moving to and from a bed to a chair (including a wheelchair)?: A Little Help needed standing up from a chair using your arms (e.g., wheelchair or bedside chair)?: A Little Help needed to walk in hospital room?: A Little Help needed climbing 3-5 steps with a railing? : A Lot 6 Click Score: 18    End of Session Equipment Utilized During Treatment: Oxygen Activity Tolerance: Patient tolerated treatment well;Patient limited by fatigue Patient left: in chair;with call bell/phone within reach Nurse Communication: Mobility status PT Visit Diagnosis: Unsteadiness on feet (R26.81);Other abnormalities of gait and mobility (R26.89);Muscle weakness (generalized) (M62.81)    Time: 1194-1740 PT Time Calculation (min) (ACUTE ONLY): 27 min   Charges:   PT Evaluation $PT Eval Moderate Complexity: 1 Mod PT Treatments $Therapeutic Activity: 23-37 mins        Zigmund Gottron, SPT

## 2022-04-30 NOTE — Plan of Care (Signed)
  Problem: Acute Rehab PT Goals(only PT should resolve) Goal: Pt Will Go Supine/Side To Sit Outcome: Progressing Flowsheets (Taken 04/30/2022 1545) Pt will go Supine/Side to Sit: with min guard assist Goal: Patient Will Transfer Sit To/From Stand Outcome: Progressing Flowsheets (Taken 04/30/2022 1545) Patient will transfer sit to/from stand: with supervision Goal: Pt Will Transfer Bed To Chair/Chair To Bed Outcome: Progressing Flowsheets (Taken 04/30/2022 1545) Pt will Transfer Bed to Chair/Chair to Bed: with supervision Goal: Pt Will Ambulate Outcome: Progressing Flowsheets (Taken 04/30/2022 1545) Pt will Ambulate:  with supervision  50 feet  with least restrictive assistive device   Zigmund Gottron, SPT

## 2022-04-30 NOTE — TOC Initial Note (Addendum)
Transition of Care [facility]) - Initial/Assessment Note    Patient Details  Name: Jeffrey [name] MRN: [id number] Date of Birth: 1945/10/25  [facility] CM/SW Contact:    Boneta Lucks, RN Phone Number: 04/30/2022, 10:23 AM  Clinical Narrative:   Patient admitted with sepsis due to pneumonia. CM spoke with his wife. She provides transportation. He goes to dialysis MWF. He uses a wheelchair when out and a walker at home. He is active with [facility] for RN/PT. They have all equipment needed in the home. MD aware to order.           Addendum : Active with [employer] for home oxygen- has all equipment needed.  Expected Discharge Plan: [place] Barriers to Discharge: Continued Medical Work up   Patient Goals and CMS Choice Patient states their goals for this hospitalization and ongoing recovery are:: to go home. CMS Medicare.gov Compare Post Acute Care list provided to:: Patient Represenative (must comment) Choice offered to / list presented to : Spouse  Expected Discharge Plan and Services Expected Discharge Plan: [place]      Living arrangements for the past 2 months: Single Family Home         Prior Living Arrangements/Services Living arrangements for the past 2 months: Single Family Home Lives with:: Spouse Patient language and need for interpreter reviewed:: Yes        Need for Family Participation in Patient Care: Yes (Comment) Care giver support system in place?: Yes (comment) Current home services: DME Criminal Activity/Legal Involvement Pertinent to Current Situation/Hospitalization: No - Comment as needed  Activities of Daily Living Home Assistive Devices/Equipment: None ADL Screening (condition at time of admission) Patient's cognitive ability adequate to safely complete daily activities?: Yes Is the patient deaf or have difficulty hearing?: Yes Does the patient have difficulty seeing, even when wearing  glasses/contacts?: No Does the patient have difficulty concentrating, remembering, or making decisions?: No Patient able to express need for assistance with ADLs?: Yes Does the patient have difficulty dressing or bathing?: Yes Independently performs ADLs?: Yes (appropriate for developmental age) Does the patient have difficulty walking or climbing stairs?: No Weakness of Legs: None Weakness of Arms/Hands: None   Emotional Assessment      Orientation: : Oriented to Self, Oriented to Place, Oriented to  Time, Oriented to Situation Alcohol / Substance Use: Not Applicable Psych Involvement: No (comment)  Admission diagnosis:  Hypokalemia [E87.6] Pancytopenia ([place]) [D61.818] Sepsis due to pneumonia ([place]) [J18.9, A41.9] Pneumonia of right middle lobe due to infectious organism [J18.9] Sepsis, due to unspecified organism, unspecified whether acute organ dysfunction present [facility]) [A41.9] Patient Active Problem List   Diagnosis Date Noted   Sepsis due to pneumonia ([place]) 04/22/2022   Chronic respiratory failure with hypoxia ([place]) 02/20/2022   Rectal bleeding 02/19/2022   UTI (urinary tract infection) due to urinary indwelling Foley catheter ([place]) 02/05/2022   Symptomatic anemia 07/28/2021   Hypokalemia 07/28/2021   Hypomagnesemia 07/28/2021   Rectal varices    Hyponatremia 01/17/2021   Thrombocytopenia ([place]) 01/17/2021   B12 deficiency 11/21/2020   Iron deficiency anemia due to chronic blood loss 06/25/2020   Gastroesophageal reflux disease 06/06/2020   Bacteremia 04/03/2020   Acute on chronic respiratory failure with hypoxia and hypercapnia ([place]) 04/03/2020   Lobar pneumonia ([place]) 04/03/2020   Volume overload 04/02/2020   Asymptomatic bacteriuria 04/02/2020   Pain of upper abdomen 02/22/2020   Urine leukocytes increased 02/22/2020   Falls  Minor head injury    Physical deconditioning    Acute hyponatremia 02/19/2020   Fever and chills 02/09/2020   Advanced care  planning/counseling discussion    DNR (do not resuscitate)    Alcoholic cirrhosis of liver ([place]) 02/08/2020   Back pain 02/08/2020   Status post thoracentesis    Acute metabolic encephalopathy 17/00/1749   Bilateral pleural effusion 12/31/2019   Hypoglycemia 12/31/2019   Hyperammonemia ([place]) 12/31/2019   Class 2 obesity 12/31/2019   Hypoalbuminemia 12/31/2019   Nosebleed    Goals of care, counseling/discussion    Palliative care by specialist    Acute hepatic encephalopathy ([place]) 12/13/2019   Encephalopathy acute    Hyperkalemia    Chronic hyponatremia    Anemia 11/16/2019   Acute blood loss anemia 09/20/2019   GI bleed    Generalized weakness 09/18/2019   Controlled type 2 diabetes mellitus with chronic kidney disease on chronic dialysis, with long-term current use of insulin ([place]) 09/18/2019   ESRD on dialysis ([place]) 09/18/2019   Clostridioides difficile infection 08/31/2019   Edema 44/96/7591   Alcoholic cirrhosis ([place]) 63/84/6659   Essential hypertension, benign 10/20/2017   Mixed hyperlipidemia 10/20/2017   Ascites    SBP (spontaneous bacterial peritonitis) ([place]) 01/12/2016   Shigella dysenteriae 01/11/2016   Diarrhea in adult patient 01/10/2016   Liver cirrhosis ([place]) 93/57/0177   Alcoholic cirrhosis of liver with ascites ([place]) 03/09/2012   Heme positive stool 03/09/2012   Anemia due to chronic blood loss 03/09/2012   Pancytopenia ([place]) 03/09/2012   FH: colon cancer 03/09/2012   Esophageal varices in alcoholic cirrhosis ([place]) 93/90/3009   PCP:  Monico Blitz, MD Pharmacy:   [place] [place] [address] [place] [address] Phone: [phone] Fax: [phone]  Readmission Risk Interventions    04/30/2022   10:22 AM 02/07/2022   12:34 PM 01/20/2021    2:33 PM  Readmission Risk Prevention Plan  Transportation Screening Complete Complete Complete  PCP or Specialist Appt within 3-5 Days Not Complete Complete Complete  HRI or  Home Care Consult Complete Complete Complete  Social Work Consult for Recovery Care Planning/Counseling Complete Complete Complete  Palliative Care Screening Complete Not Applicable Not Applicable  Medication Review Press photographer) Complete Complete Complete

## 2022-04-30 NOTE — Progress Notes (Signed)
Progress Note   Patient: Jeffrey [name] XAJ:[id number] DOB: 1946-04-14 DOA: 04/22/2022     8 DOS: the patient was seen and examined on 04/30/2022   Brief hospital course: As per H&P written by Dr.Zierle-Ghosh on 04/23/22 MEADE HOGELAND is a 76 y.o. male with medical history significant of atrial fibrillation, alcoholic cirrhosis, anxiety, chronic kidney disease, diabetes mellitus type 2, GERD, esophageal varices, hypertension, hard of hearing, and more presents the ED with a chief complaint of fever.  Unfortunately at the time of my exam, patient cannot stay awake long enough to answer questions.  I think this is less likely acute metabolic encephalopathy and more likely the fact that it is the middle of the night.  In any event adding on VBG and ammonia to this morning's labs.  In the ED patient's wife had told them that the patient had had a fever of 101 at home.  They decided to come to the hospital after a dose of Tylenol did not relieve the fever.  Patient had been in the oncology clinic yesterday.  History of iron deficiency anemia was transfused secondary to hemoglobin of 6.7.  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  He has had history of GI bleed recently in September.  He had a apparently complained of continued melena at the heme-onc clinic as well.  Assessment and Plan: 1)ESBL E. coli bacteremia and sepsis due to urinary source--POA -Urine and blood cultures with ESBL E. Coli -Patient has chronic indwelling Foley catheter -Patient met sepsis criteria on admission --MRSA PCR negative; vancomycin discontinued. -Patient disease team recommends 10 days of IV meropenem through 05/02/2022 inclusive -Repeat blood cultures  2)ESRD on dialysis (HCC)--MWF - Nephrology input appreciated -Tolerated HD on 04/29/2022  3)Chronic respiratory failure with hypoxia (HCC) - On 2-3 L nasal cannula at baseline - Currently maintaining oxygen sats on the same supplementation. - Continue as needed  bronchodilator management. -Reports breathing is essentially back to baseline currently.  4)GERD -Continue Protonix  5)CAUTI--- ESBL E. coli UTI with bacteremia sepsis as above #1 the patient with chronic indwelling Foley catheter  6)DM2-  -Last A1c 6.1 reflecting excellent diabetic control PTA -Use Novolog/Humalog Sliding scale insulin with Accu-Cheks/Fingersticks as ordered   7)Alcoholic cirrhosis ([place]) -Continue rifaximin -GI input appreciated - Continue Protonix -Continue the use of lactulose with intention to achieve 2-4 bowel movement daily -Status post paracentesis on 04/27/2022; 3.5 L removed.  Well-tolerated.  8)Essential hypertension, benign - Blood pressure overall stable -Continue metoprolol -Follow-up vital signs.   Subjective:  --Oral intake improving, Wife at bedside, wife brought him some outside food- -voided a couple of times  Physical Exam: Vitals:   04/29/22 1301 04/29/22 2111 04/30/22 0502 04/30/22 1400  BP: (!) 164/75 (!) 141/68 120/71 137/71  Pulse: 96 92 84 79  Resp: _0 Temp: 97.8 F (36.6 C) 97.8 F (36.6 C) 97.8 F (36.6 C) 97.7 F (36.5 C)  TempSrc: Oral Oral Oral   SpO2: 100% 100% 100% 100%  Weight:      Height:        Physical Exam  Gen:- Awake Alert, in no acute distress  HEENT:- [place].AT, No sclera icterus Neck-Supple Neck,No JVD,.  Lungs-  CTAB , fair air movement bilaterally  CV- S1, S2 normal, RRR Abd-  +ve B.Sounds, Abd Soft, No tenderness,    Extremity/Skin:- No  edema,   good pedal pulses  Psych-affect is appropriate, oriented x3 Neuro-no new focal deficits, no tremors MSK-left arm AV fistula positive thrill and  bruit GU-chronic indwelling Foley   Family Communication: Discussed with wife at bedside  Disposition: Home after completing IV antibiotics Status is: Inpatient Remains inpatient appropriate because: Continue treatment for healthcare associated pneumonia (meeting criteria for sepsis at time of  admission) and bacteremia.   Planned Discharge Destination: Home  Author: Roxan Hockey, MD 04/30/2022 6:39 PM  For on call review [email or web address].

## 2022-05-01 DIAGNOSIS — J189 Pneumonia, unspecified organism: Secondary | ICD-10-CM | POA: Diagnosis not present

## 2022-05-01 DIAGNOSIS — A419 Sepsis, unspecified organism: Secondary | ICD-10-CM | POA: Diagnosis not present

## 2022-05-01 LAB — BASIC METABOLIC PANEL
Anion gap: 11 (ref 5–15)
BUN: 32 mg/dL — ABNORMAL HIGH (ref 8–23)
CO2: 26 mmol/L (ref 22–32)
Calcium: 7.8 mg/dL — ABNORMAL LOW (ref 8.9–10.3)
Chloride: 94 mmol/L — ABNORMAL LOW (ref 98–111)
Creatinine, Ser: 3.51 mg/dL — ABNORMAL HIGH (ref 0.61–1.24)
GFR, Estimated: 17 mL/min — ABNORMAL LOW (ref >60)
Glucose, Bld: 137 mg/dL — ABNORMAL HIGH (ref 70–99)
Potassium: 4.4 mmol/L (ref 3.5–5.1)
Sodium: 131 mmol/L — ABNORMAL LOW (ref 135–145)

## 2022-05-01 LAB — CBC
HCT: 31.1 % — ABNORMAL LOW (ref 39.0–52.0)
Hemoglobin: 9.7 g/dL — ABNORMAL LOW (ref 13.0–17.0)
MCH: 31.2 pg (ref 26.0–34.0)
MCHC: 31.2 g/dL (ref 30.0–36.0)
MCV: 100 fL (ref 80.0–100.0)
Platelets: 113 10*3/uL — ABNORMAL LOW (ref 150–400)
RBC: 3.11 MIL/uL — ABNORMAL LOW (ref 4.22–5.81)
RDW: 19.2 % — ABNORMAL HIGH (ref 11.5–15.5)
WBC: 3.1 10*3/uL — ABNORMAL LOW (ref 4.0–10.5)
nRBC: 0 % (ref 0.0–0.2)

## 2022-05-01 LAB — GLUCOSE, CAPILLARY
Glucose-Capillary: 130 mg/dL — ABNORMAL HIGH (ref 70–99)
Glucose-Capillary: 148 mg/dL — ABNORMAL HIGH (ref 70–99)
Glucose-Capillary: 224 mg/dL — ABNORMAL HIGH (ref 70–99)

## 2022-05-01 MED ORDER — DARBEPOETIN ALFA 200 MCG/0.4ML IJ SOSY
PREFILLED_SYRINGE | INTRAMUSCULAR | Status: AC
  Administered 2022-05-01: 200 ug via SUBCUTANEOUS
  Filled 2022-05-01: qty 0.4

## 2022-05-01 MED ORDER — HEPARIN SODIUM (PORCINE) 1000 UNIT/ML DIALYSIS: 20.0000 [IU]/kg | INTRAMUSCULAR | Status: DC | PRN

## 2022-05-01 NOTE — Progress Notes (Signed)
Physical Therapy Treatment Patient Details Name: Jeffrey [name] MRN: [id number] DOB: Dec 29, 1945 Today's Date: 05/01/2022   History of Present Illness Jeffrey [name] is a 76 y.o. male with medical history significant of atrial fibrillation, alcoholic cirrhosis, anxiety, chronic kidney disease, diabetes mellitus type 2, GERD, esophageal varices, hypertension, hard of hearing, and more presents the ED with a chief complaint of fever. In the ED patient's wife had told them that the patient had had a fever of 101 at home. They decided to come to the hospital after a dose of Tylenol did not relieve the fever. Patient had been in the oncology clinic yesterday. History of iron deficiency anemia was transfused secondary to hemoglobin of 6.7.  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  He has had history of GI bleed recently in September.    PT Comments    Patient seated EOB finishing breakfast at beginning of session. He completes seated exercises at EOB while demonstrating good seated balance. He requires min G/A to power up to standing with RW. Patient demonstrates good standing balance with RW at bedside and ambulates with RW without loss of balance. He is limited by fatigue and is set up in chair at end of session. Patient will benefit from continued skilled physical therapy in hospital and recommended venue below to increase strength, balance, endurance for safe ADLs and gait.   Recommendations for follow up therapy are one component of a multi-disciplinary discharge planning process, led by the attending physician.  Recommendations may be updated based on patient status, additional functional criteria and insurance authorization.  Follow Up Recommendations  Home health PT     Assistance Recommended at Discharge Set up Supervision/Assistance  Patient can return home with the following Help with stairs or ramp for entrance;Assistance with cooking/housework;A little help with bathing/dressing/bathroom;A little  help with walking and/or transfers   Equipment Recommendations  None recommended by PT    Recommendations for Other Services       Precautions / Restrictions Precautions Precautions: Fall Restrictions Weight Bearing Restrictions: No     Mobility  Bed Mobility               General bed mobility comments: seated EOB at beginning of session    Transfers Overall transfer level: Needs assistance Equipment used: Rolling walker (2 wheels) Transfers: Sit to/from Stand, Bed to chair/wheelchair/BSC Sit to Stand: Min guard, Min assist   Step pivot transfers: Min guard       General transfer comment: patient able to stand and transfer to chair with min guard/RW    Ambulation/Gait Ambulation/Gait assistance: Min guard Gait Distance (Feet): 30 Feet Assistive device: Rolling walker (2 wheels) Gait Pattern/deviations: Decreased step length - right, Decreased step length - left, Decreased stride length Gait velocity: decreased     General Gait Details: patient able to ambulate with slowed cadence in hallway with min guard/RW and no loss of balance, easily distracted, limited mostly due to generalized weakness and fatigue   Stairs             Wheelchair Mobility    Modified Rankin (Stroke Patients Only)       Balance Overall balance assessment: Needs assistance Sitting-balance support: No upper extremity supported, Feet supported Sitting balance-Leahy Scale: Good Sitting balance - Comments: seated EOB   Standing balance support: Bilateral upper extremity supported, During functional activity, Reliant on assistive device for balance Standing balance-Leahy Scale: Good Standing balance comment: with RW  Cognition Arousal/Alertness: Awake/alert Behavior During Therapy: WFL for tasks assessed/performed Overall Cognitive Status: Within Functional Limits for tasks assessed                                           Exercises General Exercises - Lower Extremity Long Arc Quad: AROM, Both, 10 reps, Seated Hip Flexion/Marching: AROM, Both, 10 reps, Seated Toe Raises: AROM, Both, 10 reps, Seated Heel Raises: AROM, Both, 10 reps, Seated    General Comments        Pertinent Vitals/Pain Pain Assessment Pain Assessment: No/denies pain    Home Living                          Prior Function            PT Goals (current goals can now be found in the care plan section) Acute Rehab PT Goals Patient Stated Goal: return home PT Goal Formulation: With patient Time For Goal Achievement: 05/14/22 Potential to Achieve Goals: Good Progress towards PT goals: Progressing toward goals    Frequency    Min 3X/week      PT Plan Current plan remains appropriate    Co-evaluation              AM-PAC PT "6 Clicks" Mobility   Outcome Measure  Help needed turning from your back to your side while in a flat bed without using bedrails?: None Help needed moving from lying on your back to sitting on the side of a flat bed without using bedrails?: A Little Help needed moving to and from a bed to a chair (including a wheelchair)?: A Little Help needed standing up from a chair using your arms (e.g., wheelchair or bedside chair)?: A Little Help needed to walk in hospital room?: A Little Help needed climbing 3-5 steps with a railing? : A Lot 6 Click Score: 18    End of Session Equipment Utilized During Treatment: Oxygen Activity Tolerance: Patient tolerated treatment well;Patient limited by fatigue Patient left: in chair;with call bell/phone within reach Nurse Communication: Mobility status PT Visit Diagnosis: Unsteadiness on feet (R26.81);Other abnormalities of gait and mobility (R26.89);Muscle weakness (generalized) (M62.81)     Time: 3875-6433 PT Time Calculation (min) (ACUTE ONLY): 23 min  Charges:  $Therapeutic Exercise: 8-22 mins $Therapeutic Activity: 8-22 mins                      9:56 AM, 05/01/22 Mearl Latin PT, DPT Physical Therapist at [facility]

## 2022-05-01 NOTE — Progress Notes (Signed)
Patient rested most of the night during this shift. Patient was assisted to the bedside commode by nurse techs several time during the night. Patient is being monitored by centralized telemetry department with reported heart rhythm of NSR with 1 degree HB and unifocal PVC's. Patient does not appear to be in any distress at this time. No prn medications were given during shift

## 2022-05-01 NOTE — Procedures (Signed)
  HEMODIALYSIS TREATMENT NOTE:   3.5 hour heparin-free treatment completed using LUE AVF (15g/antegrade).  Hemodynamically stable.    Meds given:  Oxycodone '5mg'$ , Darbepoetin 248mg, and IV Mereponem (during last 30 minutes of session).  All blood was returned and hemostasis was achieved in 20 minutes.  Net UF 2 liters.  Hand-off given to TNigel Sloop LPN.    ARockwell Alexandria RN

## 2022-05-01 NOTE — Progress Notes (Signed)
Progress Note   Patient: Jeffrey [name] QMG:[id number] DOB: Sep 23, 1945 DOA: 04/22/2022     9 DOS: the patient was seen and examined on 05/01/2022   Brief hospital course: As per H&P written by Dr.Zierle-Ghosh on 04/23/22 Jeffrey [name] is a 76 y.o. male with medical history significant of atrial fibrillation, alcoholic cirrhosis, anxiety, chronic kidney disease, diabetes mellitus type 2, GERD, esophageal varices, hypertension, hard of hearing, and more presents the ED with a chief complaint of fever.  Unfortunately at the time of my exam, patient cannot stay awake long enough to answer questions.  I think this is less likely acute metabolic encephalopathy and more likely the fact that it is the middle of the night.  In any event adding on VBG and ammonia to this morning's labs.  In the ED patient's wife had told them that the patient had had a fever of 101 at home.  They decided to come to the hospital after a dose of Tylenol did not relieve the fever.  Patient had been in the oncology clinic yesterday.  History of iron deficiency anemia was transfused secondary to hemoglobin of 6.7.  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  He has had history of GI bleed recently in September.  He had a apparently complained of continued melena at the heme-onc clinic as well.  Assessment and Plan: 1)ESBL E. coli bacteremia and sepsis due to urinary source--POA -Urine and blood cultures with ESBL E. Coli -Patient has chronic indwelling Foley catheter -Patient met sepsis criteria on admission --MRSA PCR negative; vancomycin discontinued. -Patient disease team recommends 10 days of IV meropenem through 05/02/2022 inclusive -Repeat blood cultures from 04/29/2022 NGTD -Anticipate discharge home on 05/02/2022  2)ESRD on dialysis (HCC)--MWF - Nephrology input appreciated -Tolerated HD on 04/29/2022  3)Chronic respiratory failure with hypoxia (HCC) - On 2-3 L nasal cannula at baseline - Currently maintaining oxygen sats on  the same supplementation. - Continue as needed bronchodilator management. -Reports breathing is essentially back to baseline currently.  4)GERD -Continue Protonix  5)CAUTI--- ESBL E. coli UTI with bacteremia sepsis as above #1 the patient with chronic indwelling Foley catheter  6)DM2-  -Last A1c 6.1 reflecting excellent diabetic control PTA -Use Novolog/Humalog Sliding scale insulin with Accu-Cheks/Fingersticks as ordered   7)Alcoholic cirrhosis ([place]) -Continue rifaximin -GI input appreciated - Continue Protonix -Continue the use of lactulose with intention to achieve 2-4 bowel movement daily -Status post paracentesis on 04/27/2022; 3.5 L removed.  Well-tolerated.  8)Essential hypertension, benign - Blood pressure overall stable -Continue metoprolol -Follow-up vital signs.   Subjective:  -Tolerated hemodialysis okay -Denies fevers or chills  Physical Exam: Vitals:   05/01/22 1415 05/01/22 1430 05/01/22 1510 05/01/22 1534  BP: 123/62 133/68 134/64 (!) 140/64  Pulse:   89 91  Resp: _0 Temp:    98.6 F (37 C)  TempSrc:   Oral   SpO2:   100% 100%  Weight:   79.6 kg   Height:        Physical Exam  Gen:- Awake Alert, in no acute distress  HEENT:- [place].AT, No sclera icterus Neck-Supple Neck,No JVD,.  Lungs-  CTAB , fair air movement bilaterally  CV- S1, S2 normal, RRR Abd-  +ve B.Sounds, Abd Soft, No tenderness,    Extremity/Skin:- No  edema,   good pedal pulses  Psych-affect is appropriate, oriented x3 Neuro-no new focal deficits, no tremors MSK-left arm AV fistula positive thrill and bruit GU-chronic indwelling Foley   Family Communication: Discussed with wife  at bedside  Disposition: Home after completing IV antibiotics---Anticipate discharge home on 05/02/2022 Status is: Inpatient Remains inpatient appropriate because: Continue treatment for healthcare associated pneumonia (meeting criteria for sepsis at time of admission) and bacteremia.   Planned  Discharge Destination: Home  Author: Roxan Hockey, MD 05/01/2022 6:15 PM  For on call review [email or web address].

## 2022-05-01 NOTE — Progress Notes (Signed)
[facility] NEPHROLOGY PROGRESS NOTE  Assessment/ Plan: Pt is a 76 y.o. yo male with history of A-fib, liver cirrhosis, anxiety, DM, HTN, ESRD on HD admitted with ESBL E. coli bacteremia and sepsis.  OP orders-  [facility] MWF  4 hours  EDW 79.5 HD [place] 2/2.5, Dialyzer unknown, Heparin no. Access L UE AVF.- 350 BFR-  gets mircera 200 q 2 weeks given on 11/13, last hgb there was on 11/13 7.9  #Sepsis due to pneumonia, ESBL E. coli bacteremia: Currently on IV meropenem for 10 days through 12/2.  Per primary team.  # ESRD MWF: Plan for regular dialysis today.  Discussed with HD nurse.  # Anemia: Hemoglobin around 9.7, Aranesp with HD.  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  It seems like he is not on binders.  # HTN/volume: Blood pressure and volume acceptable.  UF with HD.  Currently on metoprolol.  #Chronic hypoxic respiratory failure on 2 to 3 L of oxygen at home which is his baseline.  #Dispo: Ok to discharge after completion of antibiotics course and as per primary team's discretion.    The patient will not be seen over the weekend however the chart will be reviewed remotely.  Please contact on-call nephrologist with any question.  Subjective: Seen and examined at bedside.  No new event.  Getting ready to get dialysis today.  Denies nausea, vomiting, chest pain, shortness of breath.  Discussed with HD nurse.  Objective Vital signs in last 24 hours: Vitals:   04/30/22 1400 04/30/22 2100 05/01/22 0557 05/01/22 1044  BP: 137/71 (!) 120/59 (!) 150/70 128/84  Pulse: 79 77 84 72  Resp: '18 20 18 20  '$ Temp: 97.7 F (36.5 C) 99 F (37.2 C) 98 F (36.7 C) 98.1 F (36.7 C)  TempSrc:   Oral Oral  SpO2: 100% 99% 98% 94%  Weight:    81.5 kg  Height:       Weight change:   Intake/Output Summary (Last 24 hours) at 05/01/2022 1100 Last data filed at 05/01/2022 0900 Gross per 24 hour  Intake 480 ml  Output 500 ml  Net -20 ml        Labs: RENAL  PANEL Recent Labs    07/28/21 1307 07/29/21 0522 07/30/21 0517 08/10/21 1141 09/18/21 0830 02/02/22 1327 02/03/22 0333 02/04/22 0301 02/05/22 0306 02/06/22 0555 02/07/22 0519 02/19/22 1421 02/19/22 1558 02/20/22 0613 04/16/22 1315 04/22/22 1940 04/23/22 0239 04/24/22 1012 04/26/22 0412 04/27/22 1600 04/29/22 0930 05/01/22 0344  NA 131* 133* 131* 132*   < > 133*   < > 133* 132* 132* 129* 130*  --  133* 133* 132* 132* 132* 131* 127* 129* 131*  K 3.1* 3.4* 3.9 4.1   < > 4.2   < > 3.0* 3.1* 3.0* 3.7 3.0*  --  3.5 3.5 2.8* 3.0* 3.5 3.7 4.3 4.6 4.4  CL 98 99 95* 96*   < > 96*   < > 97* 96* 94* 94* 94*  --  99 95* 92* 98 93* 94* 91* 91* 94*  CO2 21* '24 26 26   '$ < > 25   < > '26 26 29 26 25  '$ --  '24 27 25 23 22 23 23 23 26  '$ GLUCOSE 191* 96 68* 207*   < > 158*   < > 166* 231* 232* 239* 332*  --  132* 169* 129* 114* 206* 152* 214* 123* 137*  BUN 38* 41* 20 23   < > 34*   < >  20 29* 22 35* 20  --  23 38* 20 23 39* 36* 45* 40* 32*  CREATININE 4.06* 4.32* 2.71* 3.09*   < > 4.43*   < > 2.92* 4.07* 3.29* 4.31* 2.48*  --  2.89* 3.25* 2.05* 2.23* 3.17* 2.90* 3.76* 3.71* 3.51*  CALCIUM 8.5* 8.8* 8.6* 9.9   < > 9.2   < > 8.0* 8.2* 8.2* 8.2* 8.2*  --  8.4* 8.3* 8.1* 7.4* 8.6* 8.3* 8.0* 8.4* 7.8*  MG 1.5* 1.6* 1.7 1.8  --  1.7  --   --   --   --   --   --  1.7  --   --   --  1.8  --   --   --   --   --   PHOS  --   --  2.1*  --   --   --   --  3.0 4.6 3.4 4.5  --   --   --   --   --   --   --   --  6.0* 5.4*  --   ALBUMIN 2.7* 2.6* 3.0* 3.0*   < > 2.8*   < > 2.4* 2.5* 2.5* 2.6* 2.6*  --   --  2.6* 2.4* 2.0* 2.3* 2.2* 2.1* 2.2*  --    < > = values in this interval not displayed.      Liver Function Tests: Recent Labs  Lab 04/26/22 0412 04/27/22 1600 04/29/22 0930  AST 46*  --   --   ALT 60*  --   --   ALKPHOS 289*  --   --   BILITOT 0.8  --   --   PROT 5.9*  --   --   ALBUMIN 2.2* 2.1* 2.2*    No results for input(s): "LIPASE", "AMYLASE" in the last 168 hours. No results for input(s):  "AMMONIA" in the last 168 hours. CBC: Recent Labs    07/03/21 0940 07/08/21 1323 09/18/21 0830 09/25/21 1031 12/30/21 1051 02/02/22 1327 04/16/22 1315 04/16/22 1318 04/24/22 1012 04/26/22 0412 04/27/22 1600 04/29/22 0930 05/01/22 0344  HGB 6.9*   < > 7.2*   < > 8.1*   < >  --    < > 9.6* 9.3* 9.9* 9.7* 9.7*  MCV 100.9*   < > 100.4*   < > 103.1*   < >  --    < > 98.4 98.0 98.1 97.8 100.0  VITAMINB12 1,391*  --  1,324*  --  991*  --  992*  --   --   --   --   --   --   FERRITIN 247  --  198  --  182  --  313  --   --   --   --   --   --   TIBC 271  --  252  --  285  --  256  --   --   --   --   --   --   IRON 62  --  35*  --  57  --  50  --   --   --   --   --   --    < > = values in this interval not displayed.     Cardiac Enzymes: No results for input(s): "CKTOTAL", "CKMB", "CKMBINDEX", "TROPONINI" in the last 168 hours. CBG: Recent Labs  Lab 04/30/22 0718 04/30/22 1122 04/30/22 1634 04/30/22 2058 05/01/22 0727  GLUCAP 73 134*  214* 103* 130*     Iron Studies: No results for input(s): "IRON", "TIBC", "TRANSFERRIN", "FERRITIN" in the last 72 hours. Studies/Results: No results found.  Medications: Infusions:  meropenem (MERREM) IV 500 mg (04/30/22 1652)    Scheduled Medications:  Chlorhexidine Gluconate Cloth  6 each Topical Q0600   Chlorhexidine Gluconate Cloth  6 each Topical Q0600   Chlorhexidine Gluconate Cloth  6 each Topical Q0600   darbepoetin (ARANESP) injection - DIALYSIS  200 mcg Subcutaneous Q Fri-1800   famotidine  10 mg Oral BID   hydrOXYzine  25 mg Oral TID   insulin aspart  0-15 Units Subcutaneous TID WC   insulin aspart  0-5 Units Subcutaneous QHS   insulin detemir  5 Units Subcutaneous QHS   lactulose  10 g Oral TID   metoprolol succinate  25 mg Oral Daily   mirtazapine  30 mg Oral QHS   pantoprazole  40 mg Oral BID AC   pravastatin  20 mg Oral Daily   rifaximin  550 mg Oral BID   venlafaxine  75 mg Oral TID WC   zinc sulfate  220 mg  Oral Daily    have reviewed scheduled and prn medications.  Physical Exam: General:NAD, comfortable, able to lie comfortable Heart:RRR, s1s2 nl Lungs:clear b/l, no crackle Abdomen:soft, Non-tender, non-distended Extremities:No edema Dialysis Access: Left upper extremity AV fistula has good thrill and bruit.  Elanore Talcott Prasad Tameron Lama 05/01/2022,11:00 AM  LOS: 9 days

## 2022-05-01 NOTE — Care Management Important Message (Signed)
Important Message  Patient Details  Name: Jeffrey [name] MRN: [id number] Date of Birth: [birth year]   Medicare Important Message Given:  Other (see comment) (unable to speak with patient at [phone],attempted to call spouse [name] at [phone], no answer, copy mailed to address on file due to precautions)     [name] 05/01/2022, 4:34 PM

## 2022-05-02 DIAGNOSIS — A419 Sepsis, unspecified organism: Secondary | ICD-10-CM | POA: Diagnosis not present

## 2022-05-02 DIAGNOSIS — J189 Pneumonia, unspecified organism: Secondary | ICD-10-CM | POA: Diagnosis not present

## 2022-05-02 LAB — GLUCOSE, CAPILLARY
Glucose-Capillary: 85 mg/dL (ref 70–99)
Glucose-Capillary: 156 mg/dL — ABNORMAL HIGH (ref 70–99)

## 2022-05-02 LAB — CBC
HCT: 30.7 % — ABNORMAL LOW (ref 39.0–52.0)
Hemoglobin: 9.7 g/dL — ABNORMAL LOW (ref 13.0–17.0)
MCH: 31 pg (ref 26.0–34.0)
MCHC: 31.6 g/dL (ref 30.0–36.0)
MCV: 98.1 fL (ref 80.0–100.0)
Platelets: 102 10*3/uL — ABNORMAL LOW (ref 150–400)
RBC: 3.13 MIL/uL — ABNORMAL LOW (ref 4.22–5.81)
RDW: 19.3 % — ABNORMAL HIGH (ref 11.5–15.5)
WBC: 2.7 10*3/uL — ABNORMAL LOW (ref 4.0–10.5)
nRBC: 0 % (ref 0.0–0.2)

## 2022-05-02 LAB — BASIC METABOLIC PANEL
Anion gap: 9 (ref 5–15)
BUN: 20 mg/dL (ref 8–23)
CO2: 28 mmol/L (ref 22–32)
Calcium: 7.8 mg/dL — ABNORMAL LOW (ref 8.9–10.3)
Chloride: 95 mmol/L — ABNORMAL LOW (ref 98–111)
Creatinine, Ser: 2.51 mg/dL — ABNORMAL HIGH (ref 0.61–1.24)
GFR, Estimated: 26 mL/min — ABNORMAL LOW (ref >60)
Glucose, Bld: 94 mg/dL (ref 70–99)
Potassium: 3.9 mmol/L (ref 3.5–5.1)
Sodium: 132 mmol/L — ABNORMAL LOW (ref 135–145)

## 2022-05-02 MED ORDER — RIFAXIMIN 550 MG PO TABS: 550.0000 mg | ORAL_TABLET | Freq: Two times a day (BID) | ORAL | 5 refills | Status: AC

## 2022-05-02 MED ORDER — ZINC SULFATE 220 (50 ZN) MG PO CAPS: 220.0000 mg | ORAL_CAPSULE | Freq: Every day | ORAL | 2 refills | Status: AC

## 2022-05-02 NOTE — Progress Notes (Signed)
Patient rested quietly through the night with call bell in reach,

## 2022-05-02 NOTE — Discharge Instructions (Signed)
1)Avoid ibuprofen/Advil/Aleve/Motrin/Goody Powders/Naproxen/BC powders/Meloxicam/Diclofenac/Indomethacin and other Nonsteroidal anti-inflammatory medications as these will make you more likely to bleed and can cause stomach ulcers, can also cause Kidney problems.   2)Repeat CBC and CMP Blood Test on 05/06/22  3)Follow up with Monico Blitz, MD (Primary Care Physician) in 1 week--

## 2022-05-02 NOTE — TOC Transition Note (Signed)
Transition of Care [facility]) - CM/SW Discharge Note   Patient Details  Name: Jeffrey [name] MRN: [id number] Date of Birth: June 13, 1945  [facility] CM/SW Contact:  Iona Beard, [place] Phone Number: 05/02/2022, 11:23 AM   Clinical Narrative:    CSW updated of plan for D/C home today. CSW requested that MD place [facility] PT/RN orders. CSW updated Vaughan Basta with [facility] of plan for D/C today. TOC signing off.   Final next level of care: [place] Barriers to Discharge: Barriers Resolved   Patient Goals and CMS Choice Patient states their goals for this hospitalization and ongoing recovery are:: return home CMS Medicare.gov Compare Post Acute Care list provided to:: Patient Represenative (must comment) Choice offered to / list presented to : Spouse  Discharge Placement                       Discharge Plan and Services                          HH Arranged: PT, RN [facility] Agency: [place] ([place]) Date [place]: 05/02/22   Representative spoke with at [place]: [place] Interventions     Readmission Risk Interventions    04/30/2022   10:22 AM 02/07/2022   12:34 PM 01/20/2021    2:33 PM  Readmission Risk Prevention Plan  Transportation Screening Complete Complete Complete  PCP or Specialist Appt within 3-5 Days Not Complete Complete Complete  HRI or Home Care Consult Complete Complete Complete  Social Work Consult for [place] Planning/Counseling Complete Complete Complete  Palliative Care Screening Complete Not Applicable Not Applicable  Medication Review [employer] photographer) Complete Complete Complete

## 2022-05-02 NOTE — Discharge Summary (Signed)
Jeffrey [name], is a 76 y.o. male  DOB 05-18-1946  MRN [id number].  Admission date:  04/22/2022  Admitting Physician  Rolla Plate, DO  Discharge Date:  05/02/2022   Primary MD  Monico Blitz, MD  Recommendations for primary care physician for things to follow:   1)Avoid ibuprofen/Advil/Aleve/Motrin/Goody Powders/Naproxen/BC powders/Meloxicam/Diclofenac/Indomethacin and other Nonsteroidal anti-inflammatory medications as these will make you more likely to bleed and can cause stomach ulcers, can also cause Kidney problems.   2)Repeat CBC and CMP Blood Test on 05/06/22  3)Follow up with Monico Blitz, MD (Primary Care Physician) in 1 week  Admission Diagnosis  Hypokalemia [E87.6] Pancytopenia ([place]) [D61.818] Sepsis due to pneumonia ([place]) [J18.9, A41.9] Pneumonia of right middle lobe due to infectious organism [J18.9] Sepsis, due to unspecified organism, unspecified whether acute organ dysfunction present [facility]) [A41.9]  Discharge Diagnosis  Hypokalemia [E87.6] Pancytopenia ([place]) [D61.818] Sepsis due to pneumonia ([place]) [J18.9, A41.9] Pneumonia of right middle lobe due to infectious organism [J18.9] Sepsis, due to unspecified organism, unspecified whether acute organ dysfunction present [facility]) [A41.9]    Principal Problem:   Sepsis due to pneumonia [facility]) Active Problems:   ESRD on dialysis [facility])   Essential hypertension, benign   Alcoholic cirrhosis ([place])   Controlled type 2 diabetes mellitus with chronic kidney disease on chronic dialysis, with long-term current use of insulin ([place])   Bacteremia   Gastroesophageal reflux disease   Hypokalemia   Chronic respiratory failure with hypoxia ([place])      Past Medical History:  Diagnosis Date   A-fib [facility])    when initially stating dialysis january 2021 at [facility] per spouse; never heard anything else about it   Alcoholic cirrhosis ([place])     patient reports completing hep a and b vaccines in 2006   Anemia    has had 3 units of prbcs 2013   Anxiety    Arthritis    Asymptomatic gallstones    Ultrasound in 2006   B12 deficiency 11/21/2020   C. difficile colitis    Cholelithiasis    Chronic kidney disease    Dialysis M/W/F/Sa   Depression    Diabetes ([place])    Duodenal ulcer with hemorrhage    per patient in 2006 or 2007, records have been requested   Dyspnea    low oxygen level -  has O2 2 L all day   Esophageal varices ([place])    see PSH   GERD (gastroesophageal reflux disease)    Heart burn    History of alcohol abuse    quit 05/2013   HOH (hard of hearing)    Hypertension    Liver cirrhosis ([place])    ETOH   SBP (spontaneous bacterial peritonitis) ([place])    2020, November 2021.    Splenomegaly    Ultrasound in 2006    Past Surgical History:  Procedure Laterality Date   AGILE CAPSULE N/A 09/20/2019   Procedure: AGILE CAPSULE;  Surgeon: Daneil Dolin, MD;  [facility];  Service: Endoscopy;  Laterality: N/A;   APPENDECTOMY     AV FISTULA PLACEMENT Left 08/17/2019   Procedure: ARTERIOVENOUS (AV) FISTULA CREATION VERSES ARTERIOVENOUS GRAFT;  Surgeon: [name], MD;  [facility] OR;  Service: Vascular;  Laterality: Left;   BASCILIC VEIN TRANSPOSITION Left 09/28/2019   Procedure: LEFT SECOND STAGE [place];  Surgeon: [name], MD;  Location: [place];  Service: Vascular;  Laterality: Left;   BIOPSY  02/03/2018   Procedure: BIOPSY;  Surgeon: Daneil Dolin, MD;  [facility];  Service: Endoscopy;;  bx of gastric polyps   BIOPSY  09/19/2019   Procedure: BIOPSY;  Surgeon: Danie Binder, MD;  [facility];  Service: Endoscopy;;   BIOPSY  01/16/2021   Procedure: BIOPSY;  Surgeon: Daneil Dolin, MD;  [facility];  Service: Endoscopy;;   CATARACT EXTRACTION Right    COLONOSCOPY  03/10/2005   Rectal polyp as described above, removed with snare.  Left sided  diverticula. The remainder of the colonic mucosa appeared normal. Inflammed polyp on path.   COLONOSCOPY  04/1999   Dr. Thea Silversmith polyps removed,  Path showed hyperplastic   COLONOSCOPY  10/2015   Dr. Britta Mccreedy: diverticulosis, single sessile tubular adenoma 3-75m in size removed from descending colon.    COLONOSCOPY WITH ESOPHAGOGASTRODUODENOSCOPY (EGD)  03/31/2012   RHWE:[id number]. Colonic diverticulosis. tubular adenoma colon   COLONOSCOPY WITH ESOPHAGOGASTRODUODENOSCOPY (EGD)  06/2019   FORSYTH: small esophageal varices without high risk stigmata, single large AVM on lesser curvature in gastric body s/p APC ablation, gastric antral and duodenal bulb polyposis. No significant source to explain transfusion dependent anemia. Colonoscopy with portal colopathy and diffuse edema, changes of Cdiff colitis on colonoscopy   COLONOSCOPY WITH PROPOFOL N/A 01/19/2021   Surgeon: CDaryel November MD; 15 mm polyp in descending colon removed piecemeal, 6 mm polyp in ascending colon and 12 mm polyp in sigmoid colon not removed due to high bleeding risk, benign polypoid lesion at splenic flexure, diverticulosis in sigmoid and descending colon, rectal varices.   COLONOSCOPY WITH PROPOFOL N/A 01/16/2021   Surgeon: RDaneil Dolin MD;  Portal colopathy, large rectal varices, polypoid mass at splenic flexure biopsy, two 1 cm left colon polyps not removed, left-sided diverticulosis.  Reported patient had bleeding from splenic flexure lesion.  Pathology with acutely inflamed colonic mucosa with localized ulceration and prominent reactive changes.   ESOPHAGEAL BANDING N/A 02/03/2018   Procedure: ESOPHAGEAL BANDING;  Surgeon: RDaneil Dolin MD;  [facility];  Service: Endoscopy;  Laterality: N/A;   ESOPHAGEAL BANDING  01/16/2021   Procedure: ESOPHAGEAL BANDING;  Surgeon: RDaneil Dolin MD;  [facility];  Service: Endoscopy;;   ESOPHAGOGASTRODUODENOSCOPY  01/15/2005   Three  columns grade 1 to 2 esophageal varices, otherwise normal esophageal mucosa.  Esophagus was not manipulated otherwise./Nodularity of the antrum with overlying erosions, nonspecific finding. Path showed rare H.pylori   ESOPHAGOGASTRODUODENOSCOPY  09/2008   Dr. RGaylene Brookscolumns of grade 2-3 esoph varices, only one column was prominent. Portal gastropathy, multiple gastrc polyps at antrum, two were 2cm with black eschar, bulbar polyps, bulbar erosions   ESOPHAGOGASTRODUODENOSCOPY  11/2004   Dr. FBrantley Stage3 esoph varices   ESOPHAGOGASTRODUODENOSCOPY  03/31/2012   RMR: 4 columns(3-GR 2, 1-GR1) non-bleeding esophageal varices, portal gastropathy, small HH, early GAVE, multiple gastric polyps    ESOPHAGOGASTRODUODENOSCOPY (EGD) WITH PROPOFOL N/A 02/03/2018   Dr. RGala Romney Esophageal varices, 3 columns grade 1-2.  Portal hypertensive gastropathy.  Multiple gastric  polyps, biopsy consistent with hyperplastic.   ESOPHAGOGASTRODUODENOSCOPY (EGD) WITH PROPOFOL N/A 09/19/2019   Fields: grade I and II esophageal varcies, mild portal hypertensive gastropathy, moderate gastritis but no H. pylori, single hyperplastic gastric polyp removed, obvious source for melena/transfusion dependent anemia not identified, may be due to friable gastric and duodenal mucosa in the setting of portal hypertension   ESOPHAGOGASTRODUODENOSCOPY (EGD) WITH PROPOFOL N/A 01/16/2021   Surgeon: Daneil Dolin, MD; Portal hypertensive gastropathy, grade 2 esophageal varices with bleeding stigmata s/p banding x4, slight nodularity and congestion involving bulb and second portion of duodenum, nonspecific.  Recommended repeat EGD in 4 weeks.   ESOPHAGOGASTRODUODENOSCOPY (EGD) WITH PROPOFOL N/A 05/22/2021   stigmata of prior esophageal banding and no significant varices seen. Portal gastropathy. Polypoid gastric and duodenal mucosa with overlying erosions, marked mucosal friability.   ESOPHAGOGASTRODUODENOSCOPY (EGD) WITH PROPOFOL N/A  02/06/2022   Procedure: ESOPHAGOGASTRODUODENOSCOPY (EGD) WITH PROPOFOL;  Surgeon: Harvel Quale, MD;  [facility];  Service: Gastroenterology;  Laterality: N/A;   GASTRIC VARICES BANDING  03/31/2012   Procedure: GASTRIC VARICES BANDING;  Surgeon: Daneil Dolin, MD;  [facility];  Service: Endoscopy;  Laterality: N/A;   GIVENS CAPSULE STUDY N/A 09/21/2019   Poor study with a lot of debris obstructing much of the view of the first approximate 3 hours out of 6 hours.  No obvious source of bleeding identified.  Sequela of bleeding and old blood seen in the form of"whisps" of blood, blood flecks mostly toward the end of the study.   HEMOSTASIS CLIP PLACEMENT  01/19/2021   Procedure: HEMOSTASIS CLIP PLACEMENT;  Surgeon: [name], MD;  Location: [facility] ENDOSCOPY;  Service: Gastroenterology;;   HEMOSTASIS CLIP PLACEMENT  02/06/2022   Procedure: HEMOSTASIS CLIP PLACEMENT;  Surgeon: Montez Morita, Quillian Quince, MD;  [facility];  Service: Gastroenterology;;  gastric body   HOT HEMOSTASIS  02/06/2022   Procedure: HOT HEMOSTASIS (ARGON PLASMA COAGULATION/BICAP);  Surgeon: Montez Morita, Quillian Quince, MD;  [facility];  Service: Gastroenterology;;   IR REMOVAL TUN CV CATH W/O [facility]  02/15/2020   POLYPECTOMY  09/19/2019   Procedure: POLYPECTOMY;  Surgeon: Danie Binder, MD;  [facility];  Service: Endoscopy;;   POLYPECTOMY  01/19/2021   Procedure: POLYPECTOMY;  Surgeon: [name], MD;  Location: [facility] ENDOSCOPY;  Service: Gastroenterology;;   UMBILICAL HERNIA REPAIR  2017   exploratory laparotomy, abdominal washout     HPI  from the history and physical done on the day of admission:    HPI: DEVONTE MIGUES is a 76 y.o. male with medical history significant of atrial fibrillation, alcoholic cirrhosis, anxiety, chronic kidney disease, diabetes mellitus type 2, GERD, esophageal varices, hypertension, hard of hearing, and more presents the  ED with a chief complaint of fever.  Unfortunately at the time of my exam, patient cannot stay awake long enough to answer questions.  I think this is less likely acute metabolic encephalopathy and more likely the fact that it is the middle of the night.  In any event adding on VBG and ammonia to this morning's labs.  In the ED patient's wife had told them that the patient had had a fever of 101 at home.  They decided to come to the hospital after a dose of Tylenol did not relieve the fever.  Patient had been in the oncology clinic yesterday.  History of iron deficiency anemia was transfused secondary to hemoglobin of 6.7.  Patient does have known  pancytopenia.  He has had history of GI bleed recently in September.  He had a apparently complained of continued melena at the heme-onc clinic as well.   Patient has been both DNR and full code in past hospitalizations.  On his most recent H&P he was listed as full code so we will continue with that. Review of Systems: unable to review all systems due to the inability of the patient to answer questions.   Hospital Course:    1)ESBL E. Coli Bacteremia and Sepsis due to urinary source--POA -Urine and blood cultures with ESBL E. Coli -Patient has chronic indwelling Foley catheter--to be exchanged out on 05/02/2022 prior to discharge -Patient met sepsis criteria on admission --MRSA PCR negative; vancomycin discontinued. -infectious Disease team recommends 10 days of IV meropenem through 05/02/2022 inclusive -Repeat blood cultures from 04/29/2022 NGTD -Discharge home on 05/02/2022   2)ESRD on dialysis (HCC)--MWF - Nephrology input appreciated -Tolerated HD on 04/29/2022   3)Chronic respiratory failure with hypoxia (HCC) - On 2-3 L nasal cannula at baseline - Currently maintaining oxygen sats on the same supplementation. - Continue  bronchodilator   -Reports breathing is essentially back to baseline currently.   4)GERD -Continue Protonix    5)CAUTI--- ESBL E. coli UTI with bacteremia sepsis as above #1 the patient with chronic indwelling Foley catheter -Foley catheter to be exchanged prior to discharge on 05/02/2022 -Outpatient follow-up with urology for management of chronic indwelling Foley   6)DM2-  -Last A1c 6.1 reflecting excellent diabetic control PTA -Resume PTA insulin regimen   7)Alcoholic cirrhosis ([place]) -Continue rifaximin -GI input appreciated - Continue Protonix -Continue the use of lactulose with intention to achieve 2-4 bowel movement daily -Status post paracentesis on 04/27/2022; 3.5 L removed.  Well-tolerated.   8)Essential hypertension, benign - Blood pressure overall stable -Continue metoprolol   9) pancytopenia--multifactorial in a patient with ESRD and ESBL E. coli bacteremia -Blood indices are stable at this time -Procrit and ESA agent per nephrology team   Discharge Condition: Stable  Follow UP--- nephrology and urology  Diet and Activity recommendation:  As advised  Discharge Instructions    Discharge Instructions     Call MD for:  difficulty breathing, headache or visual disturbances   Complete by: As directed    Call MD for:  persistant dizziness or light-headedness   Complete by: As directed    Call MD for:  persistant nausea and vomiting   Complete by: As directed    Call MD for:  temperature >100.4   Complete by: As directed    Diet - low sodium heart healthy   Complete by: As directed    Diet Carb Modified   Complete by: As directed    Discharge instructions   Complete by: As directed    1)Avoid ibuprofen/Advil/Aleve/Motrin/Goody Powders/Naproxen/BC powders/Meloxicam/Diclofenac/Indomethacin and other Nonsteroidal anti-inflammatory medications as these will make you more likely to bleed and can cause stomach ulcers, can also cause Kidney problems.   2)Repeat CBC and CMP Blood Test on 05/06/22  3)Follow up with Monico Blitz, MD (Primary Care Physician) in 1 week    Increase activity slowly   Complete by: As directed          Discharge Medications     Allergies as of 05/02/2022       Reactions   Tape Itching, Rash, Other (See Comments)   Tears skin        Medication List     STOP taking these medications    colchicine 0.6 MG  tablet       TAKE these medications    acetaminophen 650 MG CR tablet Commonly known as: TYLENOL Take 1,300 mg by mouth every 8 (eight) hours as needed for pain.   Acidophilus Lactobacillus Caps Take 1 tablet by mouth daily.   Alcohol Prep 70 % Pads Apply 1 each topically 3 (three) times daily.   allopurinol 300 MG tablet Commonly known as: ZYLOPRIM Take 300 mg by mouth daily.   ALPRAZolam 0.5 MG tablet Commonly known as: XANAX Take 1 tablet (0.5 mg total) by mouth See admin instructions. Take one tablet (0.5 mg) by mouth on Monday, Wednesday, Friday before dialysis, may also take one tablet (0.5 mg) daily as needed for anxiety.   BD Pen Needle Micro U/F 32G X 6 MM Misc Generic drug: Insulin Pen Needle SMARTSIG:SUB-Q Daily PRN   calcium carbonate 500 MG chewable tablet Commonly known as: TUMS - dosed in mg elemental calcium Chew 1 tablet by mouth 3 (three) times daily with meals.   cetirizine 10 MG tablet Commonly known as: ZYRTEC Take 10 mg by mouth daily.   cholecalciferol 25 MCG (1000 UNIT) tablet Commonly known as: VITAMIN D3 Take 1,000 Units by mouth daily.   diltiazem 120 MG 24 hr capsule Commonly known as: TIAZAC Take 120 mg by mouth daily.   famotidine 20 MG tablet Commonly known as: Pepcid Take 1 tablet (20 mg total) by mouth 2 (two) times daily.   FreeStyle Libre 2 Sensor Misc Apply topically.   furosemide 40 MG tablet Commonly known as: LASIX Take 80 mg by mouth daily.   GlucaGen HypoKit 1 MG Solr injection Generic drug: glucagon Inject 1 mg into the muscle once as needed for low blood sugar.   HumaLOG KwikPen 100 UNIT/ML KwikPen Generic drug: insulin  lispro 10-50 Units 3 (three) times daily. Sliding scale   hydrOXYzine 25 MG tablet Commonly known as: ATARAX Take 25 mg by mouth 3 (three) times daily. For itching   iron polysaccharides 150 MG capsule Commonly known as: NIFEREX Take 150 mg by mouth daily with supper.   lactulose 10 GM/15ML solution Commonly known as: CHRONULAC Take 15 mLs (10 g total) by mouth 2 (two) times daily.   metoprolol succinate 25 MG 24 hr tablet Commonly known as: TOPROL-XL Take 1 tablet (25 mg total) by mouth daily.   mirtazapine 30 MG tablet Commonly known as: REMERON Take 1 tablet (30 mg total) by mouth at bedtime.   multivitamin with minerals Tabs tablet Take 1 tablet by mouth in the morning.   omega-3 acid ethyl esters 1 g capsule Commonly known as: LOVAZA Take 2 g by mouth 2 (two) times daily.   ondansetron 4 MG tablet Commonly known as: ZOFRAN Take 4 mg by mouth every 8 (eight) hours as needed for nausea or vomiting.   pantoprazole 40 MG tablet Commonly known as: PROTONIX Take 1 tablet (40 mg total) by mouth 2 (two) times daily.   pravastatin 20 MG tablet Commonly known as: PRAVACHOL Take 20 mg by mouth in the morning.   rifaximin 550 MG Tabs tablet Commonly known as: Xifaxan Take 1 tablet (550 mg total) by mouth 2 (two) times daily. What changed: how much to take   SSD 1 % cream Generic drug: silver sulfADIAZINE Apply 2 Applications topically 2 (two) times daily.   sulfamethoxazole-trimethoprim 800-160 MG tablet Commonly known as: BACTRIM DS TAKE 1 TABLET BY MOUTH every friday AFTER dialysis   thiamine 100 MG tablet Commonly known as: Vitamin B-1  Take 100 mg by mouth daily.   tiZANidine 2 MG tablet Commonly known as: ZANAFLEX Take 2 mg by mouth 2 (two) times daily as needed.   [name] Generic drug: insulin degludec Inject 10-120 Units into the skin daily. Sliding scale   triamcinolone cream 0.1 % Commonly known as:  KENALOG Apply 1 application topically 2 (two) times daily as needed (dry skin Itching).   venlafaxine 75 MG tablet Commonly known as: EFFEXOR Take 75 mg by mouth 3 (three) times daily with meals.   vitamin C 1000 MG tablet Take 1,000 mg by mouth in the morning.   zinc sulfate 220 (50 Zn) MG capsule Take 1 capsule (220 mg total) by mouth daily. Start taking on: May 03, 2022        Major procedures and Radiology Reports - PLEASE review detailed and final reports for all details, in brief -   US Paracentesis  Result Date: 04/27/2022 INDICATION: Ascites. EXAM: ULTRASOUND GUIDED therapeutic PARACENTESIS MEDICATIONS: None. COMPLICATIONS: None immediate. PROCEDURE: Informed written consent was obtained from the patient after a discussion of the risks, benefits and alternatives to treatment. A timeout was performed prior to the initiation of the procedure. Initial ultrasound scanning demonstrates a large amount of ascites within the left lower abdominal quadrant. The right lower abdomen was prepped and draped in the usual sterile fashion. 1% lidocaine was used for local anesthesia. Following this, a paracentesis catheter was introduced. An ultrasound image was saved for documentation purposes. The paracentesis was performed. The catheter was removed and a dressing was applied. The patient tolerated the procedure well without immediate post procedural complication. FINDINGS: A total of approximately 3.5 L of serous fluid was removed. IMPRESSION: Successful ultrasound-guided paracentesis yielding 3.5 liters of peritoneal fluid. Electronically Signed   By: Marijo Conception M.D.   On: 04/27/2022 10:28   DG Chest Port 1 View  Result Date: 04/22/2022 CLINICAL DATA:  Question of sepsis.  Fever. EXAM: PORTABLE CHEST 1 VIEW COMPARISON:  02/02/2022 FINDINGS: Cardiac enlargement. Small bilateral pleural effusions. Focal opacity in the right mid lung likely representing consolidation and suggesting  pneumonia. Possible infiltration or atelectasis in the left lower lung behind the heart. No pneumothorax. Mediastinal contours appear intact. Calcification of the aorta. Degenerative changes in the spine and shoulders. IMPRESSION: 1. Focal consolidation in the right middle lung, likely pneumonia. Possible infiltration or atelectasis in the left lung base behind the heart. 2. Small bilateral pleural effusions. Electronically Signed   By: Lucienne Capers M.D.   On: 04/22/2022 19:42    Micro Results   Recent Results (from the past 240 hour(s))  Blood Culture (routine x 2)     Status: Abnormal   Collection Time: 04/22/22  7:40 PM   Specimen: BLOOD RIGHT ARM  Result Value Ref Range Status   Specimen Description   Final    BLOOD RIGHT ARM Performed at [facility], [address] [place], [place] [address]    Special Requests   Final    BOTTLES DRAWN AEROBIC AND ANAEROBIC Blood Culture results may not be optimal due to an excessive volume of blood received in culture bottles Performed at [facility], [address], [place], [place] [address]    Culture  Setup Time   Final    GRAM NEGATIVE RODS ANAEROBIC BOTTLE Gram Stain Report Called to,Read Back By and Verified With: ESPOSE,R_0  BY MATTHEWS, B 11.23.23 GRAM NEGATIVE RODS AEROBIC BOTTLE ONLY Gram Stain Report Called to,Read Back By and  Verified With: TINA ISAACS,RN 04/23/22 _0  BY [facility] Performed at [facility], [address] [place], [place] [address]    Culture (A)  Final    ESCHERICHIA COLI SUSCEPTIBILITIES PERFORMED ON PREVIOUS CULTURE WITHIN THE LAST 5 DAYS. Performed at [facility], [place] [address], [place], [place] [address]    Report Status 04/25/2022 FINAL  Final  Blood Culture (routine x 2)     Status: Abnormal   Collection Time: 04/22/22  8:05 PM   Specimen: BLOOD  Result Value Ref Range Status   Specimen Description   Final    BLOOD BLOOD RIGHT HAND Performed at [facility], [address] [place], [place]  [address]    Special Requests   Final    BOTTLES DRAWN AEROBIC AND ANAEROBIC Blood Culture adequate volume Performed at [place] [place], [place] [address]    Culture  Setup Time   Final    GRAM NEGATIVE RODS ANAEROBIC BOTTLE Gram Stain Report Called to,Read Back By and Verified With: ESPOSE,R_1  BY MATTHEWS, B 11.23.23 GRAM NEGATIVE RODS AEROBIC BOTTLE ONLY Gram Stain Report Called to,Read Back By and Verified With: TINA ISAACS,RN 04/23/22 _2  BY SSLANE  Organism ID to follow CRITICAL RESULT CALLED TO, READ BACK BY AND VERIFIED WITH: PHARMD MADUEME [id number] AT 1438 BY EC Performed at [facility], [place] [address], [place], [place] [address]    Culture (A)  Final    ESCHERICHIA COLI Confirmed Extended Spectrum Beta-Lactamase Producer (ESBL).  In bloodstream infections from ESBL organisms, carbapenems are preferred over piperacillin/tazobactam. They are shown to have a lower risk of mortality.    Report Status 04/25/2022 FINAL  Final   Organism ID, Bacteria ESCHERICHIA COLI  Final      Susceptibility   Escherichia coli - MIC*    AMPICILLIN >=32 RESISTANT Resistant     CEFAZOLIN >=64 RESISTANT Resistant     CEFEPIME 4 INTERMEDIATE Intermediate     CEFTAZIDIME RESISTANT Resistant     CEFTRIAXONE >=64 RESISTANT Resistant     CIPROFLOXACIN >=4 RESISTANT Resistant     GENTAMICIN <=1 SENSITIVE Sensitive     IMIPENEM <=0.25 SENSITIVE Sensitive     TRIMETH/SULFA >=320 RESISTANT Resistant     AMPICILLIN/SULBACTAM 16 INTERMEDIATE Intermediate     PIP/TAZO <=4 SENSITIVE Sensitive     * ESCHERICHIA COLI  Blood Culture ID Panel (Reflexed)     Status: Abnormal   Collection Time: 04/22/22  8:05 PM  Result Value Ref Range Status   Enterococcus faecalis NOT DETECTED NOT DETECTED Final   Enterococcus Faecium NOT DETECTED NOT DETECTED Final   Listeria monocytogenes NOT DETECTED NOT DETECTED Final   Staphylococcus species NOT DETECTED NOT DETECTED Final   Staphylococcus  aureus (BCID) NOT DETECTED NOT DETECTED Final   Staphylococcus epidermidis NOT DETECTED NOT DETECTED Final   Staphylococcus lugdunensis NOT DETECTED NOT DETECTED Final   Streptococcus species NOT DETECTED NOT DETECTED Final   Streptococcus agalactiae NOT DETECTED NOT DETECTED Final   Streptococcus pneumoniae NOT DETECTED NOT DETECTED Final   Streptococcus pyogenes NOT DETECTED NOT DETECTED Final   A.calcoaceticus-baumannii NOT DETECTED NOT DETECTED Final   Bacteroides fragilis NOT DETECTED NOT DETECTED Final   Enterobacterales DETECTED (A) NOT DETECTED Final    Comment: Enterobacterales represent a large order of gram negative bacteria, not a single organism. CRITICAL RESULT CALLED TO, READ BACK BY AND VERIFIED WITH: PHARMD MADUEME [id number] AT 1438 BY EC    Enterobacter cloacae complex NOT DETECTED NOT DETECTED Final  Escherichia coli DETECTED (A) NOT DETECTED Final    Comment: CRITICAL RESULT CALLED TO, READ BACK BY AND VERIFIED WITH: [place] [id number] AT 1438 BY EC    Klebsiella aerogenes NOT DETECTED NOT DETECTED Final   Klebsiella oxytoca NOT DETECTED NOT DETECTED Final   Klebsiella pneumoniae NOT DETECTED NOT DETECTED Final   Proteus species NOT DETECTED NOT DETECTED Final   Salmonella species NOT DETECTED NOT DETECTED Final   Serratia marcescens NOT DETECTED NOT DETECTED Final   Haemophilus influenzae NOT DETECTED NOT DETECTED Final   Neisseria meningitidis NOT DETECTED NOT DETECTED Final   Pseudomonas aeruginosa NOT DETECTED NOT DETECTED Final   Stenotrophomonas maltophilia NOT DETECTED NOT DETECTED Final   Candida albicans NOT DETECTED NOT DETECTED Final   Candida auris NOT DETECTED NOT DETECTED Final   Candida glabrata NOT DETECTED NOT DETECTED Final   Candida krusei NOT DETECTED NOT DETECTED Final   Candida parapsilosis NOT DETECTED NOT DETECTED Final   Candida tropicalis NOT DETECTED NOT DETECTED Final   Cryptococcus neoformans/gattii NOT DETECTED NOT DETECTED  Final   CTX-M ESBL DETECTED (A) NOT DETECTED Final    Comment: CRITICAL RESULT CALLED TO, READ BACK BY AND VERIFIED WITH: PHARMD MADUEME [id number] AT 1438 BY EC (NOTE) Extended spectrum beta-lactamase detected. Recommend a carbapenem as initial therapy.      Carbapenem resistance IMP NOT DETECTED NOT DETECTED Final   Carbapenem resistance KPC NOT DETECTED NOT DETECTED Final   Carbapenem resistance NDM NOT DETECTED NOT DETECTED Final   Carbapenem resist OXA 48 LIKE NOT DETECTED NOT DETECTED Final   Carbapenem resistance VIM NOT DETECTED NOT DETECTED Final    Comment: Performed at [facility], [place] [address], [place], [place] [address]  Resp Panel by RT-PCR (Flu A&B, Covid) Anterior Nasal Swab     Status: None   Collection Time: 04/22/22  8:51 PM   Specimen: Anterior Nasal Swab  Result Value Ref Range Status   SARS Coronavirus 2 by RT PCR NEGATIVE NEGATIVE Final    Comment: (NOTE) SARS-CoV-2 target nucleic acids are NOT DETECTED.  The SARS-CoV-2 RNA is generally detectable in upper respiratory specimens during the acute phase of infection. The lowest concentration of SARS-CoV-2 viral copies this assay can detect is 138 copies/mL. A negative result does not preclude SARS-Cov-2 infection and should not be used as the sole basis for treatment or other patient management decisions. A negative result may occur with  improper specimen collection/handling, submission of specimen other than nasopharyngeal swab, presence of viral mutation(s) within the areas targeted by this assay, and inadequate number of viral copies(<138 copies/mL). A negative result must be combined with clinical observations, patient history, and epidemiological information. The expected result is Negative.  Fact Sheet for Patients:  EntrepreneurPulse.com.au  Fact Sheet for Healthcare Providers:  IncredibleEmployment.be  This test is no t yet approved or cleared by the  [place] FDA and  has been authorized for detection and/or diagnosis of SARS-CoV-2 by FDA under an Emergency Use Authorization (EUA). This EUA will remain  in effect (meaning this test can be used) for the duration of the COVID-19 declaration under Section 564(b)(1) of the Act, 21 U.S.C.section 360bbb-3(b)(1), unless the authorization is terminated  or revoked sooner.       Influenza A by PCR NEGATIVE NEGATIVE Final   Influenza B by PCR NEGATIVE NEGATIVE Final    Comment: (NOTE) The Xpert Xpress SARS-CoV-2/FLU/RSV plus assay is intended as an aid in the diagnosis of influenza from Nasopharyngeal swab specimens  and should not be used as a sole basis for treatment. Nasal washings and aspirates are unacceptable for Xpert Xpress SARS-CoV-2/FLU/RSV testing.  Fact Sheet for Patients: EntrepreneurPulse.com.au  Fact Sheet for Healthcare Providers: IncredibleEmployment.be  This test is not yet approved or cleared by the [place] FDA and has been authorized for detection and/or diagnosis of SARS-CoV-2 by FDA under an Emergency Use Authorization (EUA). This EUA will remain in effect (meaning this test can be used) for the duration of the COVID-19 declaration under Section 564(b)(1) of the Act, 21 U.S.C. section 360bbb-3(b)(1), unless the authorization is terminated or revoked.  Performed at [facility], [address], [place], [place] [address]   Group A Strep by PCR     Status: None   Collection Time: 04/22/22  9:08 PM   Specimen: Anterior Nasal Swab; Sterile Swab  Result Value Ref Range Status   Group A Strep by PCR NOT DETECTED NOT DETECTED Final    Comment: Performed at [facility], [address] [place], [place] [address]  MRSA Next Gen by PCR, Nasal     Status: None   Collection Time: 04/22/22 11:41 PM   Specimen: Nasal Mucosa; Nasal Swab  Result Value Ref Range Status   MRSA by PCR Next Gen NOT DETECTED NOT DETECTED Final     Comment: (NOTE) The GeneXpert MRSA Assay (FDA approved for NASAL specimens only), is one component of a comprehensive MRSA colonization surveillance program. It is not intended to diagnose MRSA infection nor to guide or monitor treatment for MRSA infections. Test performance is not FDA approved in patients less than 20 years old. Performed at [facility], [address] [place], [place] [address]   Urine Culture     Status: Abnormal   Collection Time: 04/23/22  5:27 PM   Specimen: In/Out Cath Urine  Result Value Ref Range Status   Specimen Description   Final    IN/OUT CATH URINE Performed at [facility], [address] [place], [place] [address]    Special Requests   Final    NONE Performed at [facility], [address], [place], [place] [address]    Culture (A)  Final    40,000 COLONIES/mL ESCHERICHIA COLI Confirmed Extended Spectrum Beta-Lactamase Producer (ESBL).  In bloodstream infections from ESBL organisms, carbapenems are preferred over piperacillin/tazobactam. They are shown to have a lower risk of mortality. 50,000 COLONIES/mL YEAST    Report Status 04/25/2022 FINAL  Final   Organism ID, Bacteria ESCHERICHIA COLI (A)  Final      Susceptibility   Escherichia coli - MIC*    AMPICILLIN >=32 RESISTANT Resistant     CEFAZOLIN >=64 RESISTANT Resistant     CEFEPIME 4 INTERMEDIATE Intermediate     CEFTRIAXONE >=64 RESISTANT Resistant     CIPROFLOXACIN >=4 RESISTANT Resistant     GENTAMICIN <=1 SENSITIVE Sensitive     IMIPENEM <=0.25 SENSITIVE Sensitive     NITROFURANTOIN <=16 SENSITIVE Sensitive     TRIMETH/SULFA >=320 RESISTANT Resistant     AMPICILLIN/SULBACTAM 8 SENSITIVE Sensitive     PIP/TAZO <=4 SENSITIVE Sensitive     * 40,000 COLONIES/mL ESCHERICHIA COLI  Culture, blood (Routine X 2) w Reflex to ID Panel     Status: None (Preliminary result)   Collection Time: 04/29/22  6:22 PM   Specimen: BLOOD RIGHT HAND  Result Value Ref Range Status   Specimen Description  BLOOD RIGHT HAND  Final   Special Requests   Final    BOTTLES DRAWN AEROBIC  AND ANAEROBIC Blood Culture adequate volume   Culture   Final    NO GROWTH 3 DAYS Performed at [facility], [address], [place], [place] [address]    Report Status PENDING  Incomplete  Culture, blood (Routine X 2) w Reflex to ID Panel     Status: None (Preliminary result)   Collection Time: 04/29/22  6:22 PM   Specimen: BLOOD  Result Value Ref Range Status   Specimen Description BLOOD RIGHT ANTECUBITAL  Final   Special Requests   Final    BOTTLES DRAWN AEROBIC AND ANAEROBIC Blood Culture adequate volume   Culture   Final    NO GROWTH 3 DAYS Performed at [facility], [address] [place], [place] [address]    Report Status PENDING  Incomplete    Today   Subjective    Halvor Behrend today has no new concerns  No fever  Or chills   No Nausea, Vomiting or Diarrhea        Patient has been seen and examined prior to discharge   Objective   Blood pressure (!) 155/77, pulse 94, temperature 98.3 F (36.8 C), temperature source Oral, resp. rate 19, height 5' 7" (1.702 m), weight 79.6 kg, SpO2 100 %.   Intake/Output Summary (Last 24 hours) at 05/02/2022 1328 Last data filed at 05/01/2022 1700 Gross per 24 hour  Intake 240 ml  Output 2000 ml  Net -1760 ml    Exam Gen:- Awake Alert, in no acute distress  HEENT:- .AT, No sclera icterus Neck-Supple Neck,No JVD,.  Lungs-  CTAB , fair air movement bilaterally  CV- S1, S2 normal, RRR Abd-  +ve B.Sounds, Abd Soft, No tenderness,    Extremity/Skin:- No  edema,   good pedal pulses  Psych-affect is appropriate, oriented x3 Neuro-generalized weakness, no new focal deficits, no tremors MSK-left arm AV fistula positive thrill and bruit GU-chronic indwelling Foley--exchanged on 05/02/22   Data Review   CBC w Diff:  Lab Results  Component Value Date   WBC 2.7 (L) 05/02/2022   HGB 9.7 (L) 05/02/2022   HCT 30.7 (L) 05/02/2022   HCT 28 02/25/2012    PLT 102 (L) 05/02/2022   PLT 125 02/25/2012   LYMPHOPCT 2 04/23/2022   BANDSPCT PENDING 08/01/2020   MONOPCT 4 04/23/2022   EOSPCT 0 04/23/2022   BASOPCT 0 04/23/2022    CMP:  Lab Results  Component Value Date   NA 132 (L) 05/02/2022   K 3.9 05/02/2022   CL 95 (L) 05/02/2022   CO2 28 05/02/2022   BUN 20 05/02/2022   CREATININE 2.51 (H) 05/02/2022   CREATININE 2.65 (H) 11/16/2019   PROT 5.9 (L) 04/26/2022   ALBUMIN 2.2 (L) 04/29/2022   BILITOT 0.8 04/26/2022   ALKPHOS 289 (H) 04/26/2022   AST 46 (H) 04/26/2022   ALT 60 (H) 04/26/2022  .  Total Discharge time is about 33 minutes  Roxan Hockey M.D on 05/02/2022 at 1:28 PM  Go to [email or web address] -  for contact info  [facility] - Office  [phone]

## 2022-05-04 LAB — CULTURE, BLOOD (ROUTINE X 2)
Culture: NO GROWTH
Culture: NO GROWTH
Special Requests: ADEQUATE
Special Requests: ADEQUATE

## 2022-05-12 ENCOUNTER — Inpatient Hospital Stay: Payer: Medicare Other

## 2022-05-12 ENCOUNTER — Inpatient Hospital Stay: Payer: Medicare Other | Attending: Hematology

## 2022-05-12 DIAGNOSIS — D649 Anemia, unspecified: Secondary | ICD-10-CM

## 2022-05-12 DIAGNOSIS — E538 Deficiency of other specified B group vitamins: Secondary | ICD-10-CM | POA: Diagnosis present

## 2022-05-12 DIAGNOSIS — D509 Iron deficiency anemia, unspecified: Secondary | ICD-10-CM | POA: Insufficient documentation

## 2022-05-12 DIAGNOSIS — D500 Iron deficiency anemia secondary to blood loss (chronic): Secondary | ICD-10-CM

## 2022-05-12 LAB — CBC WITH DIFFERENTIAL/PLATELET
Abs Immature Granulocytes: 0.01 10*3/uL (ref 0.00–0.07)
Basophils Absolute: 0.1 10*3/uL (ref 0.0–0.1)
Basophils Relative: 2 %
Eosinophils Absolute: 0.1 10*3/uL (ref 0.0–0.5)
Eosinophils Relative: 2 %
HCT: 29 % — ABNORMAL LOW (ref 39.0–52.0)
Hemoglobin: 8.9 g/dL — ABNORMAL LOW (ref 13.0–17.0)
Immature Granulocytes: 0 %
Lymphocytes Relative: 10 %
Lymphs Abs: 0.3 10*3/uL — ABNORMAL LOW (ref 0.7–4.0)
MCH: 30.9 pg (ref 26.0–34.0)
MCHC: 30.7 g/dL (ref 30.0–36.0)
MCV: 100.7 fL — ABNORMAL HIGH (ref 80.0–100.0)
Monocytes Absolute: 0.3 10*3/uL (ref 0.1–1.0)
Monocytes Relative: 10 %
Neutro Abs: 2.3 10*3/uL (ref 1.7–7.7)
Neutrophils Relative %: 76 %
Platelets: 140 10*3/uL — ABNORMAL LOW (ref 150–400)
RBC: 2.88 MIL/uL — ABNORMAL LOW (ref 4.22–5.81)
RDW: 18.2 % — ABNORMAL HIGH (ref 11.5–15.5)
WBC: 3 10*3/uL — ABNORMAL LOW (ref 4.0–10.5)
nRBC: 0 % (ref 0.0–0.2)

## 2022-05-12 LAB — SAMPLE TO BLOOD BANK

## 2022-05-12 NOTE — Patient Instructions (Signed)
[facility]  Discharge Instructions: Thank you for choosing [facility] to provide your oncology and hematology care.  If you have a lab appointment with the Cancer Center, please come in thru the Main Entrance and check in at the main information desk.  Wear comfortable clothing and clothing appropriate for easy access to any Portacath or PICC line.   We strive to give you quality time with your provider. You may need to reschedule your appointment if you arrive late (15 or more minutes).  Arriving late affects you and other patients whose appointments are after yours.  Also, if you miss three or more appointments without notifying the office, you may be dismissed from the clinic at the provider's discretion.      For prescription refill requests, have your pharmacy contact our office and allow 72 hours for refills to be completed.     To help prevent nausea and vomiting after your treatment, we encourage you to take your nausea medication as directed.  BELOW ARE SYMPTOMS THAT SHOULD BE REPORTED IMMEDIATELY: *FEVER GREATER THAN 100.4 F (38 C) OR HIGHER *CHILLS OR SWEATING *NAUSEA AND VOMITING THAT IS NOT CONTROLLED WITH YOUR NAUSEA MEDICATION *UNUSUAL SHORTNESS OF BREATH *UNUSUAL BRUISING OR BLEEDING *URINARY PROBLEMS (pain or burning when urinating, or frequent urination) *BOWEL PROBLEMS (unusual diarrhea, constipation, pain near the anus) TENDERNESS IN MOUTH AND THROAT WITH OR WITHOUT PRESENCE OF ULCERS (sore throat, sores in mouth, or a toothache) UNUSUAL RASH, SWELLING OR PAIN  UNUSUAL VAGINAL DISCHARGE OR ITCHING   Items with * indicate a potential emergency and should be followed up as soon as possible or go to the Emergency Department if any problems should occur.  Please show the CHEMOTHERAPY ALERT CARD or IMMUNOTHERAPY ALERT CARD at check-in to the Emergency Department and triage nurse.  Should you have questions after your visit or need to  cancel or reschedule your appointment, please contact [facility] [phone]  and follow the prompts.  Office hours are 8:00 a.m. to 4:30 p.m. Monday - Friday. Please note that voicemails left after 4:00 p.m. may not be returned until the following business day.  We are closed weekends and major holidays. You have access to a nurse at all times for urgent questions. Please call the main number to the clinic [phone] and follow the prompts.  For any non-urgent questions, you may also contact your provider using MyChart. We now offer e-Visits for anyone 18 and older to request care online for non-urgent symptoms. For details visit [email or web address].   Also download the MyChart app! Go to the app store, search "MyChart", open the app, select [facility], and log in with your MyChart username and password.  Masks are optional in the cancer centers. If you would like for your care team to wear a mask while they are taking care of you, please let them know. You may have one support person who is at least 75 years old accompany you for your appointments.  

## 2022-05-12 NOTE — Progress Notes (Signed)
Hemoglobin today is 8.9.  Patient is asymptomatic.  No need for blood transfusion today.  Patient left via wheelchair with wife in stable condition.

## 2022-05-14 ENCOUNTER — Ambulatory Visit (INDEPENDENT_AMBULATORY_CARE_PROVIDER_SITE_OTHER): Payer: Medicare Other | Admitting: Urology

## 2022-05-14 DIAGNOSIS — R339 Retention of urine, unspecified: Secondary | ICD-10-CM

## 2022-05-14 NOTE — Progress Notes (Signed)
Cath Change/ Replacement  Patient is present today for a catheter change due to urinary retention.  27m of water was removed from the balloon, a 14FR foley cath was removed without difficulty.  Patient was cleaned and prepped in a sterile fashion with betadine and 2% lidocaine jelly was instilled into the urethra. A 16 FR foley cath was replaced into the bladder, no complications were noted. Urine return was noted 254mand urine was amber in color. The balloon was filled with 1075mf sterile water. A leg bag was attached for drainage.  A night bag was also given to the patient and patient was given instruction on how to change from one bag to another. Patient was given proper instruction on catheter care.    Performed by: Asaf Elmquist LPN  Follow up: Keep scheduled [place]

## 2022-05-27 ENCOUNTER — Ambulatory Visit: Payer: Medicare Other | Admitting: Gastroenterology

## 2022-05-28 ENCOUNTER — Inpatient Hospital Stay: Payer: Medicare Other

## 2022-05-28 ENCOUNTER — Other Ambulatory Visit: Payer: Self-pay | Admitting: *Deleted

## 2022-05-28 VITALS — BP 155/72 | HR 73 | Temp 98.0°F | Resp 18

## 2022-05-28 DIAGNOSIS — E538 Deficiency of other specified B group vitamins: Secondary | ICD-10-CM

## 2022-05-28 DIAGNOSIS — D649 Anemia, unspecified: Secondary | ICD-10-CM

## 2022-05-28 DIAGNOSIS — D509 Iron deficiency anemia, unspecified: Secondary | ICD-10-CM | POA: Diagnosis not present

## 2022-05-28 DIAGNOSIS — D500 Iron deficiency anemia secondary to blood loss (chronic): Secondary | ICD-10-CM

## 2022-05-28 LAB — CBC WITH DIFFERENTIAL/PLATELET
Abs Immature Granulocytes: 0.02 10*3/uL (ref 0.00–0.07)
Basophils Absolute: 0 10*3/uL (ref 0.0–0.1)
Basophils Relative: 1 %
Eosinophils Absolute: 0.1 10*3/uL (ref 0.0–0.5)
Eosinophils Relative: 2 %
HCT: 21 % — ABNORMAL LOW (ref 39.0–52.0)
Hemoglobin: 6.3 g/dL — CL (ref 13.0–17.0)
Immature Granulocytes: 0 %
Lymphocytes Relative: 8 %
Lymphs Abs: 0.5 10*3/uL — ABNORMAL LOW (ref 0.7–4.0)
MCH: 31.5 pg (ref 26.0–34.0)
MCHC: 30 g/dL (ref 30.0–36.0)
MCV: 105 fL — ABNORMAL HIGH (ref 80.0–100.0)
Monocytes Absolute: 0.4 10*3/uL (ref 0.1–1.0)
Monocytes Relative: 7 %
Neutro Abs: 4.8 10*3/uL (ref 1.7–7.7)
Neutrophils Relative %: 82 %
Platelets: 130 10*3/uL — ABNORMAL LOW (ref 150–400)
RBC: 2 MIL/uL — ABNORMAL LOW (ref 4.22–5.81)
RDW: 22.9 % — ABNORMAL HIGH (ref 11.5–15.5)
WBC: 5.8 10*3/uL (ref 4.0–10.5)
nRBC: 0.5 % — ABNORMAL HIGH (ref 0.0–0.2)

## 2022-05-28 LAB — PREPARE RBC (CROSSMATCH)

## 2022-05-28 LAB — SAMPLE TO BLOOD BANK

## 2022-05-28 MED ORDER — SODIUM CHLORIDE 0.9% IV SOLUTION
250.0000 mL | Freq: Once | INTRAVENOUS | Status: AC
  Administered 2022-05-28: 250 mL via INTRAVENOUS

## 2022-05-28 MED ORDER — ACETAMINOPHEN 325 MG PO TABS
650.0000 mg | ORAL_TABLET | Freq: Once | ORAL | Status: AC
  Administered 2022-05-28: 650 mg via ORAL
  Filled 2022-05-28: qty 2

## 2022-05-28 MED ORDER — SODIUM CHLORIDE 0.9 % IV SOLN: Freq: Once | INTRAVENOUS | Status: AC

## 2022-05-28 MED ORDER — DIPHENHYDRAMINE HCL 25 MG PO CAPS: 25.0000 mg | ORAL_CAPSULE | Freq: Once | ORAL | Status: DC

## 2022-05-28 MED ORDER — CYANOCOBALAMIN 1000 MCG/ML IJ SOLN
1000.0000 ug | Freq: Once | INTRAMUSCULAR | Status: AC
  Administered 2022-05-28: 1000 ug via INTRAMUSCULAR
  Filled 2022-05-28: qty 1

## 2022-05-28 MED ORDER — LORATADINE 10 MG PO TABS
10.0000 mg | ORAL_TABLET | Freq: Once | ORAL | Status: AC
  Administered 2022-05-28: 10 mg via ORAL
  Filled 2022-05-28: qty 1

## 2022-05-28 MED ORDER — SODIUM CHLORIDE 0.9 % IV SOLN
300.0000 mg | Freq: Once | INTRAVENOUS | Status: AC
  Administered 2022-05-28: 300 mg via INTRAVENOUS
  Filled 2022-05-28: qty 300

## 2022-05-28 NOTE — Progress Notes (Signed)
Patient received Venofer, B12, and 1 UPRBC per providers order.  Stable during infusions and injection without adverse affects.  See MAR for injection details.  Vital signs stable.  No complaints at this time.  Discharge from clinic via wheelchair in stable condition.  Alert and oriented X 3.  Follow up with [facility] as scheduled.

## 2022-05-28 NOTE — Progress Notes (Signed)
CRITICAL VALUE ALERT Critical value received:  HGB 6.3. Date of notification:  05-28-2022 Time of notification: 10:46 am.  Critical value read back:  Yes.   Nurse who received alert:  B. Candi Profit RN.  MD notified time and response:  Dr. Delton Coombes @ 11:00 am. Patient to receive 1 unit of blood today per Standing orders.

## 2022-05-28 NOTE — Patient Instructions (Signed)
[facility]  Discharge Instructions: Thank you for choosing [facility] to provide your oncology and hematology care.  If you have a lab appointment with the [facility], please come in thru the Main Entrance and check in at the main information desk.  Wear comfortable clothing and clothing appropriate for easy access to any Portacath or PICC line.   We strive to give you quality time with your provider. You may need to reschedule your appointment if you arrive late (15 or more minutes).  Arriving late affects you and other patients whose appointments are after yours.  Also, if you miss three or more appointments without notifying the office, you may be dismissed from the clinic at the provider's discretion.      For prescription refill requests, have your pharmacy contact our office and allow 72 hours for refills to be completed.    Today you received the following chemotherapy and/or immunotherapy agents Venofer/B12/1UPRBC      To help prevent nausea and vomiting after your treatment, we encourage you to take your nausea medication as directed.  BELOW ARE SYMPTOMS THAT SHOULD BE REPORTED IMMEDIATELY: *FEVER GREATER THAN 100.4 F (38 C) OR HIGHER *CHILLS OR SWEATING *NAUSEA AND VOMITING THAT IS NOT CONTROLLED WITH YOUR NAUSEA MEDICATION *UNUSUAL SHORTNESS OF BREATH *UNUSUAL BRUISING OR BLEEDING *URINARY PROBLEMS (pain or burning when urinating, or frequent urination) *BOWEL PROBLEMS (unusual diarrhea, constipation, pain near the anus) TENDERNESS IN MOUTH AND THROAT WITH OR WITHOUT PRESENCE OF ULCERS (sore throat, sores in mouth, or a toothache) UNUSUAL RASH, SWELLING OR PAIN  UNUSUAL VAGINAL DISCHARGE OR ITCHING   Items with * indicate a potential emergency and should be followed up as soon as possible or go to the Emergency Department if any problems should occur.  Please show the CHEMOTHERAPY ALERT CARD or IMMUNOTHERAPY ALERT CARD at check-in to  the Emergency Department and triage nurse.  Should you have questions after your visit or need to cancel or reschedule your appointment, please contact [facility] [phone]  and follow the prompts.  Office hours are 8:00 a.m. to 4:30 p.m. Monday - Friday. Please note that voicemails left after 4:00 p.m. may not be returned until the following business day.  We are closed weekends and major holidays. You have access to a nurse at all times for urgent questions. Please call the main number to the clinic [phone] and follow the prompts.  For any non-urgent questions, you may also contact your provider using MyChart. We now offer e-Visits for anyone 34 and older to request care online for non-urgent symptoms. For details visit [email or web address].   Also download the MyChart app! Go to the app store, search "MyChart", open the app, select [facility], and log in with your MyChart username and password.

## 2022-05-29 LAB — TYPE AND SCREEN
ABO/RH(D): O POS
Antibody Screen: NEGATIVE
Unit division: 0

## 2022-05-29 LAB — BPAM RBC
Blood Product Expiration Date: 202401292359
ISSUE DATE / TIME: 202312281251
Unit Type and Rh: 5100

## 2022-06-08 ENCOUNTER — Encounter (HOSPITAL_COMMUNITY): Payer: Self-pay | Admitting: Hematology

## 2022-06-09 ENCOUNTER — Ambulatory Visit: Payer: Medicare Other

## 2022-06-11 ENCOUNTER — Inpatient Hospital Stay: Payer: Medicare Other

## 2022-06-11 ENCOUNTER — Inpatient Hospital Stay: Payer: Medicare Other | Attending: Hematology

## 2022-06-11 ENCOUNTER — Ambulatory Visit: Payer: Medicare Other

## 2022-06-11 ENCOUNTER — Other Ambulatory Visit (HOSPITAL_COMMUNITY)
Admission: RE | Admit: 2022-06-11 | Discharge: 2022-06-11 | Disposition: A | Discharge status: Home / Self Care | Payer: Medicare Other | Source: Ambulatory Visit | Attending: Internal Medicine | Admitting: Internal Medicine

## 2022-06-11 ENCOUNTER — Encounter (HOSPITAL_COMMUNITY): Payer: Self-pay | Admitting: Hematology

## 2022-06-11 VITALS — BP 146/74 | HR 71 | Temp 96.8°F | Resp 18

## 2022-06-11 DIAGNOSIS — D649 Anemia, unspecified: Secondary | ICD-10-CM

## 2022-06-11 DIAGNOSIS — D500 Iron deficiency anemia secondary to blood loss (chronic): Secondary | ICD-10-CM

## 2022-06-11 DIAGNOSIS — D509 Iron deficiency anemia, unspecified: Secondary | ICD-10-CM | POA: Insufficient documentation

## 2022-06-11 DIAGNOSIS — M1990 Unspecified osteoarthritis, unspecified site: Secondary | ICD-10-CM | POA: Insufficient documentation

## 2022-06-11 LAB — CBC WITH DIFFERENTIAL/PLATELET
Abs Immature Granulocytes: 0.01 10*3/uL (ref 0.00–0.07)
Basophils Absolute: 0 10*3/uL (ref 0.0–0.1)
Basophils Relative: 0 %
Eosinophils Absolute: 0.1 10*3/uL (ref 0.0–0.5)
Eosinophils Relative: 1 %
HCT: 23.3 % — ABNORMAL LOW (ref 39.0–52.0)
Hemoglobin: 6.8 g/dL — CL (ref 13.0–17.0)
Immature Granulocytes: 0 %
Lymphocytes Relative: 7 %
Lymphs Abs: 0.3 10*3/uL — ABNORMAL LOW (ref 0.7–4.0)
MCH: 31.9 pg (ref 26.0–34.0)
MCHC: 29.2 g/dL — ABNORMAL LOW (ref 30.0–36.0)
MCV: 109.4 fL — ABNORMAL HIGH (ref 80.0–100.0)
Monocytes Absolute: 0.3 10*3/uL (ref 0.1–1.0)
Monocytes Relative: 6 %
Neutro Abs: 3.9 10*3/uL (ref 1.7–7.7)
Neutrophils Relative %: 86 %
Platelets: 106 10*3/uL — ABNORMAL LOW (ref 150–400)
RBC: 2.13 MIL/uL — ABNORMAL LOW (ref 4.22–5.81)
RDW: 22.3 % — ABNORMAL HIGH (ref 11.5–15.5)
WBC: 4.5 10*3/uL (ref 4.0–10.5)
nRBC: 0 % (ref 0.0–0.2)

## 2022-06-11 LAB — SAMPLE TO BLOOD BANK

## 2022-06-11 LAB — URIC ACID: Uric Acid, Serum: 1.6 mg/dL — ABNORMAL LOW (ref 3.7–8.6)

## 2022-06-11 LAB — PREPARE RBC (CROSSMATCH)

## 2022-06-11 MED ORDER — ACETAMINOPHEN 325 MG PO TABS
650.0000 mg | ORAL_TABLET | Freq: Once | ORAL | Status: AC
  Administered 2022-06-11: 650 mg via ORAL
  Filled 2022-06-11: qty 2

## 2022-06-11 MED ORDER — SODIUM CHLORIDE 0.9% IV SOLUTION
250.0000 mL | Freq: Once | INTRAVENOUS | Status: AC
  Administered 2022-06-11: 250 mL via INTRAVENOUS

## 2022-06-11 MED ORDER — DIPHENHYDRAMINE HCL 25 MG PO CAPS
25.0000 mg | ORAL_CAPSULE | Freq: Once | ORAL | Status: AC
  Administered 2022-06-11: 25 mg via ORAL
  Filled 2022-06-11: qty 1

## 2022-06-11 NOTE — Progress Notes (Signed)
CRITICAL VALUE ALERT Critical value received:  HGB 6.8  Date of notification:  06-11-2022. Time of notification: 09:53 am.  Critical value read back:  Yes.   Nurse who received alert:  D.Wilson Therapist, sports.  MD notified time and response:  R. Pennington PA @ 1006 am. Patient will receive 2 units of PRBC's today.   2 units of blood given today per MD orders. Tolerated infusion without adverse affects. Vital signs stable. No complaints at this time. Discharged from clinic by wheel chair accompanied by his wife, in stable condition. Alert and oriented x 3. F/U with [facility] as scheduled.

## 2022-06-11 NOTE — Patient Instructions (Signed)
[facility]  Discharge Instructions: Thank you for choosing [facility] to provide your oncology and hematology care.  If you have a lab appointment with the [facility], please come in thru the Main Entrance and check in at the main information desk.  Wear comfortable clothing and clothing appropriate for easy access to any Portacath or PICC line.   We strive to give you quality time with your provider. You may need to reschedule your appointment if you arrive late (15 or more minutes).  Arriving late affects you and other patients whose appointments are after yours.  Also, if you miss three or more appointments without notifying the office, you may be dismissed from the clinic at the provider's discretion.      For prescription refill requests, have your pharmacy contact our office and allow 72 hours for refills to be completed.    Today you received the following chemotherapy and/or immunotherapy agents 2 units of packed red blood cells.  Blood Transfusion, Adult, Care After The following information offers guidance on how to care for yourself after your procedure. Your health care provider may also give you more specific instructions. If you have problems or questions, contact your health care provider. What can I expect after the procedure? After the procedure, it is common to have: Bruising and soreness where the IV was inserted. A headache. Follow these instructions at home: IV insertion site care     Follow instructions from your health care provider about how to take care of your IV insertion site. Make sure you: Wash your hands with soap and water for at least 20 seconds before and after you change your bandage (dressing). If soap and water are not available, use hand sanitizer. Change your dressing as told by your health care provider. Check your IV insertion site every day for signs of infection. Check for: Redness, swelling, or  pain. Bleeding from the site. Warmth. Pus or a bad smell. General instructions Take over-the-counter and prescription medicines only as told by your health care provider. Rest as told by your health care provider. Return to your normal activities as told by your health care provider. Keep all follow-up visits. Lab tests may need to be done at certain periods to recheck your blood counts. Contact a health care provider if: You have itching or red, swollen areas of skin (hives). You have a fever or chills. You have pain in the head, back, or chest. You feel anxious or you feel weak after doing your normal activities. You have redness, swelling, warmth, or pain around the IV insertion site. You have blood coming from the IV insertion site that does not stop with pressure. You have pus or a bad smell coming from your IV insertion site. If you received your blood transfusion in an outpatient setting, you will be told whom to contact to report any reactions. Get help right away if: You have symptoms of a serious allergic or immune system reaction, including: Trouble breathing or shortness of breath. Swelling of the face, feeling flushed, or widespread rash. Dark urine or blood in the urine. Fast heartbeat. These symptoms may be an emergency. Get help right away. Call 911. Do not wait to see if the symptoms will go away. Do not drive yourself to the hospital. Summary Bruising and soreness around the IV insertion site are common. Check your IV insertion site every day for signs of infection. Rest as told by your health care provider. Return  to your normal activities as told by your health care provider. Get help right away for symptoms of a serious allergic or immune system reaction to the blood transfusion. This information is not intended to replace advice given to you by your health care provider. Make sure you discuss any questions you have with your health care provider. Document  Revised: 08/15/2021 Document Reviewed: 08/15/2021 Elsevier Patient Education  [place].       To help prevent nausea and vomiting after your treatment, we encourage you to take your nausea medication as directed.  BELOW ARE SYMPTOMS THAT SHOULD BE REPORTED IMMEDIATELY: *FEVER GREATER THAN 100.4 F (38 C) OR HIGHER *CHILLS OR SWEATING *NAUSEA AND VOMITING THAT IS NOT CONTROLLED WITH YOUR NAUSEA MEDICATION *UNUSUAL SHORTNESS OF BREATH *UNUSUAL BRUISING OR BLEEDING *URINARY PROBLEMS (pain or burning when urinating, or frequent urination) *BOWEL PROBLEMS (unusual diarrhea, constipation, pain near the anus) TENDERNESS IN MOUTH AND THROAT WITH OR WITHOUT PRESENCE OF ULCERS (sore throat, sores in mouth, or a toothache) UNUSUAL RASH, SWELLING OR PAIN  UNUSUAL VAGINAL DISCHARGE OR ITCHING   Items with * indicate a potential emergency and should be followed up as soon as possible or go to the Emergency Department if any problems should occur.  Please show the CHEMOTHERAPY ALERT CARD or IMMUNOTHERAPY ALERT CARD at check-in to the Emergency Department and triage nurse.  Should you have questions after your visit or need to cancel or reschedule your appointment, please contact [facility] [phone]  and follow the prompts.  Office hours are 8:00 a.m. to 4:30 p.m. Monday - Friday. Please note that voicemails left after 4:00 p.m. may not be returned until the following business day.  We are closed weekends and major holidays. You have access to a nurse at all times for urgent questions. Please call the main number to the clinic [phone] and follow the prompts.  For any non-urgent questions, you may also contact your provider using MyChart. We now offer e-Visits for anyone 62 and older to request care online for non-urgent symptoms. For details visit [email or web address].   Also download the MyChart app! Go to the app store, search "MyChart", open the app, select  [facility], and log in with your MyChart username and password.

## 2022-06-12 LAB — TYPE AND SCREEN
ABO/RH(D): O POS
Antibody Screen: NEGATIVE
Unit division: 0
Unit division: 0

## 2022-06-12 LAB — BPAM RBC
Blood Product Expiration Date: 202402032359
Blood Product Expiration Date: 202402032359
ISSUE DATE / TIME: 202401111111
ISSUE DATE / TIME: 202401111240
Unit Type and Rh: 5100
Unit Type and Rh: 5100

## 2022-06-15 ENCOUNTER — Encounter (HOSPITAL_COMMUNITY): Payer: Self-pay

## 2022-06-15 ENCOUNTER — Telehealth: Payer: Self-pay | Admitting: Gastroenterology

## 2022-06-15 ENCOUNTER — Inpatient Hospital Stay: Admit: 2022-06-15 | Payer: Medicare Other

## 2022-06-15 ENCOUNTER — Emergency Department: Payer: Self-pay

## 2022-06-15 ENCOUNTER — Inpatient Hospital Stay (HOSPITAL_COMMUNITY)
Admission: EM | Admit: 2022-06-15 | Discharge: 2022-07-02 | DRG: 208 | Disposition: E | Payer: Medicare Other | Source: Other Acute Inpatient Hospital | Attending: Family Medicine | Admitting: Family Medicine

## 2022-06-15 DIAGNOSIS — R64 Cachexia: Secondary | ICD-10-CM | POA: Diagnosis present

## 2022-06-15 DIAGNOSIS — R578 Other shock: Secondary | ICD-10-CM | POA: Diagnosis not present

## 2022-06-15 DIAGNOSIS — R1310 Dysphagia, unspecified: Secondary | ICD-10-CM | POA: Diagnosis not present

## 2022-06-15 DIAGNOSIS — Z1152 Encounter for screening for COVID-19: Secondary | ICD-10-CM

## 2022-06-15 DIAGNOSIS — K7682 Hepatic encephalopathy: Secondary | ICD-10-CM | POA: Diagnosis not present

## 2022-06-15 DIAGNOSIS — Z6822 Body mass index (BMI) 22.0-22.9, adult: Secondary | ICD-10-CM

## 2022-06-15 DIAGNOSIS — F1011 Alcohol abuse, in remission: Secondary | ICD-10-CM | POA: Diagnosis present

## 2022-06-15 DIAGNOSIS — J9621 Acute and chronic respiratory failure with hypoxia: Secondary | ICD-10-CM | POA: Diagnosis present

## 2022-06-15 DIAGNOSIS — D631 Anemia in chronic kidney disease: Secondary | ICD-10-CM | POA: Diagnosis present

## 2022-06-15 DIAGNOSIS — K7031 Alcoholic cirrhosis of liver with ascites: Secondary | ICD-10-CM | POA: Diagnosis present

## 2022-06-15 DIAGNOSIS — Z66 Do not resuscitate: Secondary | ICD-10-CM | POA: Diagnosis not present

## 2022-06-15 DIAGNOSIS — Z823 Family history of stroke: Secondary | ICD-10-CM

## 2022-06-15 DIAGNOSIS — I4891 Unspecified atrial fibrillation: Secondary | ICD-10-CM | POA: Diagnosis present

## 2022-06-15 DIAGNOSIS — N186 End stage renal disease: Secondary | ICD-10-CM

## 2022-06-15 DIAGNOSIS — Z833 Family history of diabetes mellitus: Secondary | ICD-10-CM

## 2022-06-15 DIAGNOSIS — Z79899 Other long term (current) drug therapy: Secondary | ICD-10-CM

## 2022-06-15 DIAGNOSIS — D689 Coagulation defect, unspecified: Secondary | ICD-10-CM | POA: Diagnosis not present

## 2022-06-15 DIAGNOSIS — E1165 Type 2 diabetes mellitus with hyperglycemia: Secondary | ICD-10-CM | POA: Diagnosis present

## 2022-06-15 DIAGNOSIS — T4275XA Adverse effect of unspecified antiepileptic and sedative-hypnotic drugs, initial encounter: Secondary | ICD-10-CM | POA: Diagnosis not present

## 2022-06-15 DIAGNOSIS — Z992 Dependence on renal dialysis: Secondary | ICD-10-CM | POA: Diagnosis not present

## 2022-06-15 DIAGNOSIS — K766 Portal hypertension: Secondary | ICD-10-CM | POA: Diagnosis present

## 2022-06-15 DIAGNOSIS — G9341 Metabolic encephalopathy: Secondary | ICD-10-CM | POA: Diagnosis not present

## 2022-06-15 DIAGNOSIS — R627 Adult failure to thrive: Secondary | ICD-10-CM | POA: Diagnosis present

## 2022-06-15 DIAGNOSIS — K652 Spontaneous bacterial peritonitis: Secondary | ICD-10-CM | POA: Diagnosis not present

## 2022-06-15 DIAGNOSIS — K3189 Other diseases of stomach and duodenum: Secondary | ICD-10-CM | POA: Diagnosis present

## 2022-06-15 DIAGNOSIS — I953 Hypotension of hemodialysis: Secondary | ICD-10-CM | POA: Diagnosis not present

## 2022-06-15 DIAGNOSIS — I1311 Hypertensive heart and chronic kidney disease without heart failure, with stage 5 chronic kidney disease, or end stage renal disease: Secondary | ICD-10-CM | POA: Diagnosis present

## 2022-06-15 DIAGNOSIS — R195 Other fecal abnormalities: Secondary | ICD-10-CM | POA: Diagnosis not present

## 2022-06-15 DIAGNOSIS — N2581 Secondary hyperparathyroidism of renal origin: Secondary | ICD-10-CM | POA: Diagnosis present

## 2022-06-15 DIAGNOSIS — R680 Hypothermia, not associated with low environmental temperature: Secondary | ICD-10-CM | POA: Diagnosis not present

## 2022-06-15 DIAGNOSIS — E871 Hypo-osmolality and hyponatremia: Secondary | ICD-10-CM | POA: Diagnosis present

## 2022-06-15 DIAGNOSIS — R34 Anuria and oliguria: Secondary | ICD-10-CM | POA: Diagnosis not present

## 2022-06-15 DIAGNOSIS — E8729 Other acidosis: Secondary | ICD-10-CM | POA: Diagnosis not present

## 2022-06-15 DIAGNOSIS — J1001 Influenza due to other identified influenza virus with the same other identified influenza virus pneumonia: Secondary | ICD-10-CM | POA: Diagnosis present

## 2022-06-15 DIAGNOSIS — J9622 Acute and chronic respiratory failure with hypercapnia: Secondary | ICD-10-CM | POA: Diagnosis not present

## 2022-06-15 DIAGNOSIS — D75829 Heparin-induced thrombocytopenia, unspecified: Secondary | ICD-10-CM | POA: Diagnosis not present

## 2022-06-15 DIAGNOSIS — E43 Unspecified severe protein-calorie malnutrition: Secondary | ICD-10-CM | POA: Diagnosis not present

## 2022-06-15 DIAGNOSIS — Z794 Long term (current) use of insulin: Secondary | ICD-10-CM

## 2022-06-15 DIAGNOSIS — Z9981 Dependence on supplemental oxygen: Secondary | ICD-10-CM

## 2022-06-15 DIAGNOSIS — J810 Acute pulmonary edema: Principal | ICD-10-CM | POA: Diagnosis present

## 2022-06-15 DIAGNOSIS — Z781 Physical restraint status: Secondary | ICD-10-CM

## 2022-06-15 DIAGNOSIS — R490 Dysphonia: Secondary | ICD-10-CM | POA: Diagnosis not present

## 2022-06-15 DIAGNOSIS — J101 Influenza due to other identified influenza virus with other respiratory manifestations: Secondary | ICD-10-CM | POA: Diagnosis not present

## 2022-06-15 DIAGNOSIS — E876 Hypokalemia: Secondary | ICD-10-CM | POA: Diagnosis present

## 2022-06-15 DIAGNOSIS — F32A Depression, unspecified: Secondary | ICD-10-CM | POA: Diagnosis present

## 2022-06-15 DIAGNOSIS — Z800 Family history of malignant neoplasm of digestive organs: Secondary | ICD-10-CM

## 2022-06-15 DIAGNOSIS — E1122 Type 2 diabetes mellitus with diabetic chronic kidney disease: Secondary | ICD-10-CM | POA: Diagnosis present

## 2022-06-15 DIAGNOSIS — D500 Iron deficiency anemia secondary to blood loss (chronic): Secondary | ICD-10-CM | POA: Diagnosis not present

## 2022-06-15 DIAGNOSIS — E785 Hyperlipidemia, unspecified: Secondary | ICD-10-CM | POA: Diagnosis present

## 2022-06-15 DIAGNOSIS — I8510 Secondary esophageal varices without bleeding: Secondary | ICD-10-CM | POA: Diagnosis present

## 2022-06-15 DIAGNOSIS — R0603 Acute respiratory distress: Principal | ICD-10-CM

## 2022-06-15 DIAGNOSIS — M898X9 Other specified disorders of bone, unspecified site: Secondary | ICD-10-CM | POA: Diagnosis present

## 2022-06-15 DIAGNOSIS — Z8711 Personal history of peptic ulcer disease: Secondary | ICD-10-CM

## 2022-06-15 DIAGNOSIS — Z803 Family history of malignant neoplasm of breast: Secondary | ICD-10-CM

## 2022-06-15 DIAGNOSIS — F419 Anxiety disorder, unspecified: Secondary | ICD-10-CM | POA: Diagnosis present

## 2022-06-15 DIAGNOSIS — K219 Gastro-esophageal reflux disease without esophagitis: Secondary | ICD-10-CM | POA: Diagnosis present

## 2022-06-15 DIAGNOSIS — R451 Restlessness and agitation: Secondary | ICD-10-CM | POA: Diagnosis not present

## 2022-06-15 DIAGNOSIS — Z87891 Personal history of nicotine dependence: Secondary | ICD-10-CM

## 2022-06-15 DIAGNOSIS — J9601 Acute respiratory failure with hypoxia: Secondary | ICD-10-CM | POA: Diagnosis present

## 2022-06-15 LAB — BASIC METABOLIC PANEL
Anion gap: 11 (ref 5–15)
BUN: 49 mg/dL — ABNORMAL HIGH (ref 8–23)
CO2: 24 mmol/L (ref 22–32)
Calcium: 8.3 mg/dL — ABNORMAL LOW (ref 8.9–10.3)
Chloride: 95 mmol/L — ABNORMAL LOW (ref 98–111)
Creatinine, Ser: 3.5 mg/dL — ABNORMAL HIGH (ref 0.61–1.24)
GFR, Estimated: 17 mL/min — ABNORMAL LOW (ref >60)
Glucose, Bld: 227 mg/dL — ABNORMAL HIGH (ref 70–99)
Potassium: 4.2 mmol/L (ref 3.5–5.1)
Sodium: 130 mmol/L — ABNORMAL LOW (ref 135–145)

## 2022-06-15 LAB — CBC
HCT: 24.2 % — ABNORMAL LOW (ref 39.0–52.0)
Hemoglobin: 7.8 g/dL — ABNORMAL LOW (ref 13.0–17.0)
MCH: 32.9 pg (ref 26.0–34.0)
MCHC: 32.2 g/dL (ref 30.0–36.0)
MCV: 102.1 fL — ABNORMAL HIGH (ref 80.0–100.0)
Platelets: 104 10*3/uL — ABNORMAL LOW (ref 150–400)
RBC: 2.37 MIL/uL — ABNORMAL LOW (ref 4.22–5.81)
RDW: 21.3 % — ABNORMAL HIGH (ref 11.5–15.5)
WBC: 7.1 10*3/uL (ref 4.0–10.5)
nRBC: 0 % (ref 0.0–0.2)

## 2022-06-15 LAB — HEPATITIS B SURFACE ANTIGEN: Hepatitis B Surface Ag: NONREACTIVE

## 2022-06-15 LAB — MAGNESIUM: Magnesium: 1.5 mg/dL — ABNORMAL LOW (ref 1.7–2.4)

## 2022-06-15 MED ORDER — RIFAXIMIN 550 MG PO TABS
550.0000 mg | ORAL_TABLET | Freq: Two times a day (BID) | ORAL | Status: DC
  Administered 2022-06-15 – 2022-06-22 (×13): 550 mg via ORAL
  Filled 2022-06-15 (×16): qty 1

## 2022-06-15 MED ORDER — PRAVASTATIN SODIUM 10 MG PO TABS
20.0000 mg | ORAL_TABLET | Freq: Every morning | ORAL | Status: DC
  Administered 2022-06-16 – 2022-06-22 (×6): 20 mg via ORAL
  Filled 2022-06-15 (×2): qty 2
  Filled 2022-06-15 (×5): qty 1

## 2022-06-15 MED ORDER — MIRTAZAPINE 15 MG PO TABS
30.0000 mg | ORAL_TABLET | Freq: Every day | ORAL | Status: DC
  Administered 2022-06-16 – 2022-06-21 (×6): 30 mg via ORAL
  Filled 2022-06-15 (×6): qty 2

## 2022-06-15 MED ORDER — CHLORHEXIDINE GLUCONATE CLOTH 2 % EX PADS
6.0000 | MEDICATED_PAD | Freq: Every day | CUTANEOUS | Status: DC
  Administered 2022-06-17 – 2022-06-27 (×10): 6 via TOPICAL

## 2022-06-15 MED ORDER — SULFAMETHOXAZOLE-TRIMETHOPRIM 800-160 MG PO TABS
1.0000 | ORAL_TABLET | ORAL | Status: DC
  Administered 2022-06-19: 1 via ORAL
  Filled 2022-06-15: qty 1

## 2022-06-15 MED ORDER — LACTULOSE 10 GM/15ML PO SOLN
10.0000 g | Freq: Two times a day (BID) | ORAL | Status: DC
  Administered 2022-06-15 – 2022-06-20 (×6): 10 g via ORAL
  Filled 2022-06-15 (×10): qty 15
  Filled 2022-06-15: qty 30

## 2022-06-15 MED ORDER — OSELTAMIVIR PHOSPHATE 30 MG PO CAPS
30.0000 mg | ORAL_CAPSULE | Freq: Every day | ORAL | Status: DC
  Administered 2022-06-15 – 2022-06-16 (×2): 30 mg via ORAL
  Filled 2022-06-15 (×3): qty 1

## 2022-06-15 MED ORDER — INSULIN ASPART 100 UNIT/ML IJ SOLN
0.0000 [IU] | Freq: Every day | INTRAMUSCULAR | Status: DC
  Administered 2022-06-19: 4 [IU] via SUBCUTANEOUS
  Administered 2022-06-21: 2 [IU] via SUBCUTANEOUS

## 2022-06-15 MED ORDER — INSULIN ASPART 100 UNIT/ML IJ SOLN
0.0000 [IU] | Freq: Three times a day (TID) | INTRAMUSCULAR | Status: DC
  Administered 2022-06-16: 1 [IU] via SUBCUTANEOUS
  Administered 2022-06-16: 2 [IU] via SUBCUTANEOUS
  Administered 2022-06-17: 1 [IU] via SUBCUTANEOUS
  Administered 2022-06-18 – 2022-06-19 (×5): 2 [IU] via SUBCUTANEOUS
  Administered 2022-06-20 (×2): 5 [IU] via SUBCUTANEOUS
  Administered 2022-06-20: 3 [IU] via SUBCUTANEOUS
  Administered 2022-06-21 (×2): 5 [IU] via SUBCUTANEOUS
  Administered 2022-06-21: 7 [IU] via SUBCUTANEOUS
  Administered 2022-06-22: 5 [IU] via SUBCUTANEOUS

## 2022-06-15 MED ORDER — DARBEPOETIN ALFA 200 MCG/0.4ML IJ SOSY
200.0000 ug | PREFILLED_SYRINGE | INTRAMUSCULAR | Status: DC
  Administered 2022-06-15: 200 ug via SUBCUTANEOUS
  Filled 2022-06-15 (×2): qty 0.4

## 2022-06-15 NOTE — Assessment & Plan Note (Addendum)
Continue lactulose and rifaximin - last paracentesis 11/27 -Patient originally was planning to undergo paracentesis at [facility] -Paracentesis able to be performed on 06/18/2022, 2.3 L fluid removed; negative for SBP per PMN count; culture negative

## 2022-06-15 NOTE — Telephone Encounter (Signed)
Looks like patient is pending direct admission today.

## 2022-06-15 NOTE — Assessment & Plan Note (Signed)
Continue insulin and sliding scale.

## 2022-06-15 NOTE — Telephone Encounter (Signed)
Patient has an upcoming appt with you on 07/26/2022. Was last seen in November.

## 2022-06-15 NOTE — Consult Note (Signed)
[facility] Renal Consultation Note    Indication for Consultation:  Management of ESRD/hemodialysis; anemia, hypertension/volume and secondary hyperparathyroidism   HPI: Jeffrey [name] is a 77 y.o. male with ESRD on HD MWF at [facility]. PMH also DM, HTN, AF, cirrhosis with recurrent ascites, history of GIB, chronic home oxygen 2L [place].   He is a direct admit from [facility] secondary to respiratory distress needing dialysis.  Apparently was also scheduled for paracentesis today. Did require BiPAP at [facility] ED.   Per notes labs at [facility] showed Na 128, K 4.2, BUN 48 Cr 3.8, lactic acid 1.2, ammonia 85, Flu A positive. CXR cardiomegaly with small bilateral pleural effusions and acute pulmonary edema.   Last dialysis was Friday. He is due for routine dialysis today. Seen and examined in the ED. Off BiPAP during my exam. Feels like breathing better than this am. Endorses some abdominal discomfort. Denies fevers, chills, chest pain, nausea/vomiting. His mouth is dry and he wants something to drink.   Past Medical History:  Diagnosis Date   A-fib [facility])    when initially stating dialysis january 2021 at [facility] per spouse; never heard anything else about it   Alcoholic cirrhosis [facility])    patient reports completing hep a and b vaccines in 2006   Anemia    has had 3 units of prbcs 2013   Anxiety    Arthritis    Asymptomatic gallstones    Ultrasound in 2006   B12 deficiency 11/21/2020   C. difficile colitis    Cholelithiasis    Chronic kidney disease    Dialysis M/W/F/Sa   Depression    Diabetes ([place])    Duodenal ulcer with hemorrhage    per patient in 2006 or 2007, records have been requested   Dyspnea    low oxygen level -  has O2 2 L all day   Esophageal varices ([place])    see [facility]   GERD (gastroesophageal reflux disease)    Heart burn    History of alcohol abuse    quit 05/2013   HOH (hard of hearing)    Hypertension    Liver cirrhosis ([place])    ETOH   SBP  (spontaneous bacterial peritonitis) ([place])    2020, November 2021.    Splenomegaly    Ultrasound in 2006   Past Surgical History:  Procedure Laterality Date   AGILE CAPSULE N/A 09/20/2019   Procedure: AGILE CAPSULE;  Surgeon: Daneil Dolin, MD;  [facility];  Service: Endoscopy;  Laterality: N/A;   APPENDECTOMY     AV FISTULA PLACEMENT Left 08/17/2019   Procedure: ARTERIOVENOUS (AV) FISTULA CREATION VERSES ARTERIOVENOUS GRAFT;  Surgeon: [name], MD;  Location: [facility] OR;  Service: Vascular;  Laterality: Left;   BASCILIC VEIN TRANSPOSITION Left 09/28/2019   Procedure: LEFT SECOND STAGE [place];  Surgeon: [name], MD;  Location: [place];  Service: Vascular;  Laterality: Left;   BIOPSY  02/03/2018   Procedure: BIOPSY;  Surgeon: Daneil Dolin, MD;  [facility];  Service: Endoscopy;;  bx of gastric polyps   BIOPSY  09/19/2019   Procedure: BIOPSY;  Surgeon: Danie Binder, MD;  [facility];  Service: Endoscopy;;   BIOPSY  01/16/2021   Procedure: BIOPSY;  Surgeon: Daneil Dolin, MD;  [facility];  Service: Endoscopy;;   CATARACT EXTRACTION Right    COLONOSCOPY  03/10/2005   Rectal polyp as described above,  removed with snare. Left sided  diverticula. The remainder of the colonic mucosa appeared normal. Inflammed polyp on path.   COLONOSCOPY  04/1999   Dr. Thea Silversmith polyps removed,  Path showed hyperplastic   COLONOSCOPY  10/2015   Dr. Britta Mccreedy: diverticulosis, single sessile tubular adenoma 3-69m in size removed from descending colon.    COLONOSCOPY WITH ESOPHAGOGASTRODUODENOSCOPY (EGD)  03/31/2012   RZOX:[id number]. Colonic diverticulosis. tubular adenoma colon   COLONOSCOPY WITH ESOPHAGOGASTRODUODENOSCOPY (EGD)  06/2019   FORSYTH: small esophageal varices without high risk stigmata, single large AVM on lesser curvature in gastric body s/p APC ablation, gastric antral and duodenal bulb polyposis. No significant  source to explain transfusion dependent anemia. Colonoscopy with portal colopathy and diffuse edema, changes of Cdiff colitis on colonoscopy   COLONOSCOPY WITH PROPOFOL N/A 01/19/2021   Surgeon: CDaryel November MD; 15 mm polyp in descending colon removed piecemeal, 6 mm polyp in ascending colon and 12 mm polyp in sigmoid colon not removed due to high bleeding risk, benign polypoid lesion at splenic flexure, diverticulosis in sigmoid and descending colon, rectal varices.   COLONOSCOPY WITH PROPOFOL N/A 01/16/2021   Surgeon: RDaneil Dolin MD;  Portal colopathy, large rectal varices, polypoid mass at splenic flexure biopsy, two 1 cm left colon polyps not removed, left-sided diverticulosis.  Reported patient had bleeding from splenic flexure lesion.  Pathology with acutely inflamed colonic mucosa with localized ulceration and prominent reactive changes.   ESOPHAGEAL BANDING N/A 02/03/2018   Procedure: ESOPHAGEAL BANDING;  Surgeon: RDaneil Dolin MD;  [facility];  Service: Endoscopy;  Laterality: N/A;   ESOPHAGEAL BANDING  01/16/2021   Procedure: ESOPHAGEAL BANDING;  Surgeon: RDaneil Dolin MD;  [facility];  Service: Endoscopy;;   ESOPHAGOGASTRODUODENOSCOPY  01/15/2005   Three columns grade 1 to 2 esophageal varices, otherwise normal esophageal mucosa.  Esophagus was not manipulated otherwise./Nodularity of the antrum with overlying erosions, nonspecific finding. Path showed rare H.pylori   ESOPHAGOGASTRODUODENOSCOPY  09/2008   Dr. RGaylene Brookscolumns of grade 2-3 esoph varices, only one column was prominent. Portal gastropathy, multiple gastrc polyps at antrum, two were 2cm with black eschar, bulbar polyps, bulbar erosions   ESOPHAGOGASTRODUODENOSCOPY  11/2004   Dr. FBrantley Stage3 esoph varices   ESOPHAGOGASTRODUODENOSCOPY  03/31/2012   RMR: 4 columns(3-GR 2, 1-GR1) non-bleeding esophageal varices, portal gastropathy, small HH, early GAVE, multiple gastric  polyps    ESOPHAGOGASTRODUODENOSCOPY (EGD) WITH PROPOFOL N/A 02/03/2018   Dr. RGala Romney Esophageal varices, 3 columns grade 1-2.  Portal hypertensive gastropathy.  Multiple gastric polyps, biopsy consistent with hyperplastic.   ESOPHAGOGASTRODUODENOSCOPY (EGD) WITH PROPOFOL N/A 09/19/2019   Fields: grade I and II esophageal varcies, mild portal hypertensive gastropathy, moderate gastritis but no H. pylori, single hyperplastic gastric polyp removed, obvious source for melena/transfusion dependent anemia not identified, may be due to friable gastric and duodenal mucosa in the setting of portal hypertension   ESOPHAGOGASTRODUODENOSCOPY (EGD) WITH PROPOFOL N/A 01/16/2021   Surgeon: RDaneil Dolin MD; Portal hypertensive gastropathy, grade 2 esophageal varices with bleeding stigmata s/p banding x4, slight nodularity and congestion involving bulb and second portion of duodenum, nonspecific.  Recommended repeat EGD in 4 weeks.   ESOPHAGOGASTRODUODENOSCOPY (EGD) WITH PROPOFOL N/A 05/22/2021   stigmata of prior esophageal banding and no significant varices seen. Portal gastropathy. Polypoid gastric and duodenal mucosa with overlying erosions, marked mucosal friability.   ESOPHAGOGASTRODUODENOSCOPY (EGD) WITH PROPOFOL N/A 02/06/2022   Procedure: ESOPHAGOGASTRODUODENOSCOPY (EGD) WITH PROPOFOL;  Surgeon: CHarvel Quale MD;  [facility];  Service: Gastroenterology;  Laterality: N/A;   GASTRIC VARICES BANDING  03/31/2012   Procedure: GASTRIC VARICES BANDING;  Surgeon: Daneil Dolin, MD;  [facility];  Service: Endoscopy;  Laterality: N/A;   GIVENS CAPSULE STUDY N/A 09/21/2019   Poor study with a lot of debris obstructing much of the view of the first approximate 3 hours out of 6 hours.  No obvious source of bleeding identified.  Sequela of bleeding and old blood seen in the form of"whisps" of blood, blood flecks mostly toward the end of the study.   HEMOSTASIS CLIP PLACEMENT   01/19/2021   Procedure: HEMOSTASIS CLIP PLACEMENT;  Surgeon: [name], MD;  Location: [facility] ENDOSCOPY;  Service: Gastroenterology;;   HEMOSTASIS CLIP PLACEMENT  02/06/2022   Procedure: HEMOSTASIS CLIP PLACEMENT;  Surgeon: Montez Morita, Quillian Quince, MD;  [facility];  Service: Gastroenterology;;  gastric body   HOT HEMOSTASIS  02/06/2022   Procedure: HOT HEMOSTASIS (ARGON PLASMA COAGULATION/BICAP);  Surgeon: Montez Morita, Quillian Quince, MD;  [facility];  Service: Gastroenterology;;   IR REMOVAL TUN CV CATH W/O [facility]  02/15/2020   POLYPECTOMY  09/19/2019   Procedure: POLYPECTOMY;  Surgeon: Danie Binder, MD;  [facility];  Service: Endoscopy;;   POLYPECTOMY  01/19/2021   Procedure: POLYPECTOMY;  Surgeon: [name], MD;  Location: [facility] ENDOSCOPY;  Service: Gastroenterology;;   UMBILICAL HERNIA REPAIR  2017   exploratory laparotomy, abdominal washout   Family History  Problem Relation Age of Onset   Colon cancer Father 66       deceased age 21   Breast cancer Sister        deceased   Diabetes Sister    Stroke Mother    Healthy Son    Healthy Daughter    Lung cancer Neg Hx    Ovarian cancer Neg Hx    Social History:  reports that he quit smoking about 36 years ago. His smoking use included cigarettes. He has a 50.00 pack-year smoking history. He has never used smokeless tobacco. He reports that he does not drink alcohol and does not use drugs. Allergies  Allergen Reactions   Tape Itching, Rash and Other (See Comments)    Tears skin   Prior to Admission medications   Medication Sig Start Date End Date Taking? Authorizing Provider  acetaminophen (TYLENOL) 650 MG CR tablet Take 1,300 mg by mouth every 8 (eight) hours as needed for pain.    [provider]  Acidophilus Lactobacillus CAPS Take 1 tablet by mouth daily.    [provider]  Alcohol Swabs (ALCOHOL PREP) 70 % PADS Apply 1 each topically 3 (three) times daily.  01/31/20   [provider]  allopurinol (ZYLOPRIM) 300 MG tablet Take 300 mg by mouth daily. 12/11/21   [provider]  ALPRAZolam Duanne Moron) 0.5 MG tablet Take 1 tablet (0.5 mg total) by mouth See admin instructions. Take one tablet (0.5 mg) by mouth on Monday, Wednesday, Friday before dialysis, may also take one tablet (0.5 mg) daily as needed for anxiety. 02/07/22   Orson Eva, MD  Ascorbic Acid (VITAMIN C) 1000 MG tablet Take 1,000 mg by mouth in the morning.    [provider]  BD PEN NEEDLE MICRO U/F 32G X 6 MM MISC SMARTSIG:SUB-Q Daily PRN 04/23/21   [provider]  calcium carbonate (TUMS - DOSED IN MG ELEMENTAL CALCIUM) 500 MG chewable tablet Chew 1 tablet by mouth 3 (three) times  daily with meals.    [provider]  cetirizine (ZYRTEC) 10 MG tablet Take 10 mg by mouth daily. 06/24/21   [provider]  cholecalciferol (VITAMIN D3) 25 MCG (1000 UNIT) tablet Take 1,000 Units by mouth daily.    [provider]  Continuous Blood Gluc Sensor (FREESTYLE LIBRE 2 SENSOR) MISC Apply topically. 10/15/21   [provider]  diltiazem (TIAZAC) 120 MG 24 hr capsule Take 120 mg by mouth daily. 02/17/22   [provider]  famotidine (PEPCID) 20 MG tablet Take 1 tablet (20 mg total) by mouth 2 (two) times daily. 07/30/21 07/30/22  Barton Dubois, MD  furosemide (LASIX) 40 MG tablet Take 80 mg by mouth daily. 12/29/19   [provider]  GLUCAGEN HYPOKIT 1 MG SOLR injection Inject 1 mg into the muscle once as needed for low blood sugar. 11/28/20   [provider]  HUMALOG KWIKPEN 100 UNIT/ML KwikPen 10-50 Units 3 (three) times daily. Sliding scale 04/23/21   [provider]  hydrOXYzine (ATARAX/VISTARIL) 25 MG tablet Take 25 mg by mouth 3 (three) times daily. For itching 11/23/20   [provider]  iron polysaccharides (NIFEREX) 150 MG capsule Take 150 mg by mouth daily with supper.    [provider]  lactulose (CHRONULAC) 10 GM/15ML solution Take 15 mLs (10 g total) by mouth 2 (two) times daily. 02/07/22   Orson Eva, MD  metoprolol succinate (TOPROL-XL) 25 MG 24 hr tablet Take 1 tablet (25 mg total) by mouth daily. 02/22/22   Orson Eva, MD  mirtazapine (REMERON) 30 MG tablet Take 1 tablet (30 mg total) by mouth at bedtime. 02/10/20   Roxan Hockey, MD  Multiple Vitamin (MULTIVITAMIN WITH MINERALS) TABS tablet Take 1 tablet by mouth in the morning.     [provider]  omega-3 acid ethyl esters (LOVAZA) 1 g capsule Take 2 g by mouth 2 (two) times daily.  04/07/19   [provider]  ondansetron (ZOFRAN) 4 MG tablet Take 4 mg by mouth every 8 (eight) hours as needed for nausea or vomiting.    [provider]  pantoprazole (PROTONIX) 40 MG tablet Take 1 tablet (40 mg total) by mouth 2 (two) times daily. 02/21/22   Orson Eva, MD  pravastatin (PRAVACHOL) 20 MG tablet Take 20 mg by mouth in the morning.     [provider]  rifaximin (XIFAXAN) 550 MG TABS tablet Take 1 tablet (550 mg total) by mouth 2 (two) times daily. 05/02/22   Roxan Hockey, MD  SSD 1 % cream Apply 2 Applications topically 2 (two) times daily. 02/26/22   [provider]  sulfamethoxazole-trimethoprim (BACTRIM DS) 800-160 MG tablet TAKE 1 TABLET BY MOUTH every friday AFTER dialysis 03/12/22   Annitta Needs, NP  thiamine (VITAMIN B-1) 100 MG tablet Take 100 mg by mouth daily. 08/27/20   [provider]  tiZANidine (ZANAFLEX) 2 MG tablet Take 2 mg by mouth 2 (two) times daily as needed. 04/07/22   [provider]  TRESIBA FLEXTOUCH 200 UNIT/ML FlexTouch Pen Inject 10-120 Units into the skin daily. Sliding scale 02/17/22   [provider]  triamcinolone cream (KENALOG) 0.1 % Apply 1 application topically 2 (two) times daily as needed (dry skin Itching). 12/30/17   [provider]  venlafaxine (EFFEXOR) 75 MG tablet Take 75 mg by mouth 3 (three) times daily  with meals.     [provider]  zinc sulfate 220 (50 Zn) MG capsule Take 1  capsule (220 mg total) by mouth daily. 05/03/22   Roxan Hockey, MD   No current facility-administered medications for this encounter.   Current Outpatient Medications  Medication Sig Dispense Refill   acetaminophen (TYLENOL) 650 MG CR tablet Take 1,300 mg by mouth every 8 (eight) hours as needed for pain.     Acidophilus Lactobacillus CAPS Take 1 tablet by mouth daily.     Alcohol Swabs (ALCOHOL PREP) 70 % PADS Apply 1 each topically 3 (three) times daily.     allopurinol (ZYLOPRIM) 300 MG tablet Take 300 mg by mouth daily.     ALPRAZolam (XANAX) 0.5 MG tablet Take 1 tablet (0.5 mg total) by mouth See admin instructions. Take one tablet (0.5 mg) by mouth on Monday, Wednesday, Friday before dialysis, may also take one tablet (0.5 mg) daily as needed for anxiety. 5 tablet 0   Ascorbic Acid (VITAMIN C) 1000 MG tablet Take 1,000 mg by mouth in the morning.     BD PEN NEEDLE MICRO U/F 32G X 6 MM MISC SMARTSIG:SUB-Q Daily PRN     calcium carbonate (TUMS - DOSED IN MG ELEMENTAL CALCIUM) 500 MG chewable tablet Chew 1 tablet by mouth 3 (three) times daily with meals.     cetirizine (ZYRTEC) 10 MG tablet Take 10 mg by mouth daily.     cholecalciferol (VITAMIN D3) 25 MCG (1000 UNIT) tablet Take 1,000 Units by mouth daily.     Continuous Blood Gluc Sensor (FREESTYLE LIBRE 2 SENSOR) MISC Apply topically.     diltiazem (TIAZAC) 120 MG 24 hr capsule Take 120 mg by mouth daily.     famotidine (PEPCID) 20 MG tablet Take 1 tablet (20 mg total) by mouth 2 (two) times daily. 60 tablet 1   furosemide (LASIX) 40 MG tablet Take 80 mg by mouth daily.     GLUCAGEN HYPOKIT 1 MG SOLR injection Inject 1 mg into the muscle once as needed for low blood sugar.     HUMALOG KWIKPEN 100 UNIT/ML KwikPen 10-50 Units 3 (three) times daily. Sliding scale     hydrOXYzine (ATARAX/VISTARIL) 25 MG tablet Take 25 mg by mouth 3 (three) times daily.  For itching     iron polysaccharides (NIFEREX) 150 MG capsule Take 150 mg by mouth daily with supper.     lactulose (CHRONULAC) 10 GM/15ML solution Take 15 mLs (10 g total) by mouth 2 (two) times daily. 236 mL 0   metoprolol succinate (TOPROL-XL) 25 MG 24 hr tablet Take 1 tablet (25 mg total) by mouth daily. 30 tablet 1   mirtazapine (REMERON) 30 MG tablet Take 1 tablet (30 mg total) by mouth at bedtime. 30 tablet 3   Multiple Vitamin (MULTIVITAMIN WITH MINERALS) TABS tablet Take 1 tablet by mouth in the morning.      omega-3 acid ethyl esters (LOVAZA) 1 g capsule Take 2 g by mouth 2 (two) times daily.      ondansetron (ZOFRAN) 4 MG tablet Take 4 mg by mouth every 8 (eight) hours as needed for nausea or vomiting.     pantoprazole (PROTONIX) 40 MG tablet Take 1 tablet (40 mg total) by mouth 2 (two) times daily. 60 tablet 1   pravastatin (PRAVACHOL) 20 MG tablet Take 20 mg by mouth in the morning.      rifaximin (XIFAXAN) 550 MG TABS tablet Take 1 tablet (550 mg total) by mouth 2 (two) times daily. 60 tablet 5   SSD 1 % cream Apply 2 Applications topically 2 (two) times daily.  sulfamethoxazole-trimethoprim (BACTRIM DS) 800-160 MG tablet TAKE 1 TABLET BY MOUTH every friday AFTER dialysis 12 tablet 1   thiamine (VITAMIN B-1) 100 MG tablet Take 100 mg by mouth daily.     tiZANidine (ZANAFLEX) 2 MG tablet Take 2 mg by mouth 2 (two) times daily as needed.     TRESIBA FLEXTOUCH 200 UNIT/ML FlexTouch Pen Inject 10-120 Units into the skin daily. Sliding scale     triamcinolone cream (KENALOG) 0.1 % Apply 1 application topically 2 (two) times daily as needed (dry skin Itching).  3   venlafaxine (EFFEXOR) 75 MG tablet Take 75 mg by mouth 3 (three) times daily with meals.      zinc sulfate 220 (50 Zn) MG capsule Take 1 capsule (220 mg total) by mouth daily. 30 capsule 2     ROS: As per HPI otherwise negative.  Physical Exam: Vitals:   06/16/2022 1444 06/23/2022 1445  BP: (!) 134/56 (!) 125/58  Pulse:  84 82  SpO2: 98% 97%     General: Appears comfortable, in no distress on nasal oxygen  Head: NCAT sclera not icteric MMM Neck: Supple. Trachea midline.  Lungs: Rales at bases, bilaterally  Heart: RRR, systolic murmur  Abdomen: soft non-tender, +ascites  Lower extremities:trace LE edema + various stages of bruising over extremities Neuro: A & O X 3. Moves all extremities spontaneously. Psych:  Responds to questions appropriately, speech a little garbled. Dialysis Access: LUE AVF+ bruit, aneurysmal with ecchymosis to distal portion   Labs: Basic Metabolic Panel: No results for input(s): "NA", "K", "CL", "CO2", "GLUCOSE", "BUN", "CREATININE", "CALCIUM", "PHOS" in the last 168 hours.  Invalid input(s): "ALB" Liver Function Tests: No results for input(s): "AST", "ALT", "ALKPHOS", "BILITOT", "PROT", "ALBUMIN" in the last 168 hours. No results for input(s): "LIPASE", "AMYLASE" in the last 168 hours. No results for input(s): "AMMONIA" in the last 168 hours. CBC: Recent Labs  Lab 06/11/22 0906  WBC 4.5  NEUTROABS 3.9  HGB 6.8*  HCT 23.3*  MCV 109.4*  PLT 106*   Cardiac Enzymes: No results for input(s): "CKTOTAL", "CKMB", "CKMBINDEX", "TROPONINI" in the last 168 hours. CBG: No results for input(s): "GLUCAP" in the last 168 hours. Iron Studies: No results for input(s): "IRON", "TIBC", "TRANSFERRIN", "FERRITIN" in the last 72 hours. Studies/Results: No results found.  Dialysis Orders:  [facility] MWF 4:00 EDW 80.5kg  350/500 2K/2.5Ca AVF No heparin Mircera 200 (due 1/15)   Assessment/Plan: Dyspnea/respiratory distress -secondary to volume overload/Influenza A +--initially requiring BiPAP-now off. Still with increased O2 requirement. 2L [place] at baseline. HD today to get volume down  ESRD -  HD MWF. Continue on schedule. HD tonight  Hypertension/volume  - BP stable/ 3-4 UF with HD tonight as tolerated  Anemia  - Hb 7.8. s/p transfusion on 1/11. Due for ESA today -will order   Metabolic bone disease -  Follow Ca/Phos here. No VDRA as outpatient  Nutrition - Renal diet with fluid restriction.  Hx of cirrhosis with ascites - per primary team    Lynnda Child PA-C [facility] 06/24/2022, 2:57 PM

## 2022-06-15 NOTE — ED Triage Notes (Signed)
Pt arrives by [facility] from [facility]. Patient unable to tolerate dialysis and paracentesis today due to respiratory distress. Patient on bipap upon arrival and tolerating well. Pt lactic 3.5 down to 1.2, Ammonia 85, flu-A positive.

## 2022-06-15 NOTE — Telephone Encounter (Signed)
Noted  

## 2022-06-15 NOTE — H&P (Signed)
History and Physical    Patient: Jeffrey [name] LKJ:[id number] DOB: September 28, 1945 DOA: 06/29/2022 DOS: the patient was seen and examined on 06/16/2022 PCP: Monico Blitz, MD  Patient coming from: Home  Chief Complaint:  Chief Complaint  Patient presents with   Respiratory Distress   HPI: Patient is a 77 year old male with past medical history of diabetes mellitus, hypertension, GI bleed, chronic respiratory failure on 2 L nasal cannula, atrial fibrillation and cirrhosis who had presented to [facility] on 1/14 for scheduled paracentesis.  However, patient unable to get procedure done as he was complaining of shortness of breath and unable to lie flat.  He has been complaining of shortness of breath and cough x 2 days.  Workup revealed patient in acute respiratory distress requiring BiPAP.  Patient positive for flu.  Patient only has dialysis on Monday/Wednesday/Friday and because no dialysis available at [facility], patient transferred to [facility] ER due to lack of available beds on floor.  By time of arrival to emergency department here, patient down to 5 L nasal cannula.  Nephrology consulted and plan to take patient for dialysis tonight.  Review of Systems: Complains of shortness of breath and cough.  Denies any headaches, vision changes, chest pain, palpitations, dysphagia, abdominal pain, hematuria, dysuria, constipation or diarrhea, focal extremity numbness weakness or pain Past Medical History:  Diagnosis Date   A-fib [facility])    when initially stating dialysis january 2021 at [facility] per spouse; never heard anything else about it   Alcoholic cirrhosis [facility])    patient reports completing hep a and b vaccines in 2006   Anemia    has had 3 units of prbcs 2013   Anxiety    Arthritis    Asymptomatic gallstones    Ultrasound in 2006   B12 deficiency 11/21/2020   C. difficile colitis    Cholelithiasis    Chronic kidney disease    Dialysis M/W/F/Sa   Depression     Diabetes ([place])    Duodenal ulcer with hemorrhage    per patient in 2006 or 2007, records have been requested   Dyspnea    low oxygen level -  has O2 2 L all day   Esophageal varices ([place])    see [facility]   GERD (gastroesophageal reflux disease)    Heart burn    History of alcohol abuse    quit 05/2013   HOH (hard of hearing)    Hypertension    Liver cirrhosis ([place])    ETOH   SBP (spontaneous bacterial peritonitis) ([place])    2020, November 2021.    Splenomegaly    Ultrasound in 2006   Past Surgical History:  Procedure Laterality Date   AGILE CAPSULE N/A 09/20/2019   Procedure: AGILE CAPSULE;  Surgeon: Daneil Dolin, MD;  [facility];  Service: Endoscopy;  Laterality: N/A;   APPENDECTOMY     AV FISTULA PLACEMENT Left 08/17/2019   Procedure: ARTERIOVENOUS (AV) FISTULA CREATION VERSES ARTERIOVENOUS GRAFT;  Surgeon: [name], MD;  Location: [facility] OR;  Service: Vascular;  Laterality: Left;   BASCILIC VEIN TRANSPOSITION Left 09/28/2019   Procedure: LEFT SECOND STAGE [place];  Surgeon: [name], MD;  Location: [place];  Service: Vascular;  Laterality: Left;   BIOPSY  02/03/2018   Procedure: BIOPSY;  Surgeon: Daneil Dolin, MD;  [facility];  Service: Endoscopy;;  bx of gastric polyps   BIOPSY  09/19/2019   Procedure: BIOPSY;  Surgeon: Danie Binder, MD;  [facility];  Service: Endoscopy;;   BIOPSY  01/16/2021   Procedure: BIOPSY;  Surgeon: Daneil Dolin, MD;  [facility];  Service: Endoscopy;;   CATARACT EXTRACTION Right    COLONOSCOPY  03/10/2005   Rectal polyp as described above, removed with snare. Left sided  diverticula. The remainder of the colonic mucosa appeared normal. Inflammed polyp on path.   COLONOSCOPY  04/1999   Dr. Thea Silversmith polyps removed,  Path showed hyperplastic   COLONOSCOPY  10/2015   Dr. Britta Mccreedy: diverticulosis, single sessile tubular adenoma 3-68m in size removed from descending  colon.    COLONOSCOPY WITH ESOPHAGOGASTRODUODENOSCOPY (EGD)  03/31/2012   RION:[id number]. Colonic diverticulosis. tubular adenoma colon   COLONOSCOPY WITH ESOPHAGOGASTRODUODENOSCOPY (EGD)  06/2019   FORSYTH: small esophageal varices without high risk stigmata, single large AVM on lesser curvature in gastric body s/p APC ablation, gastric antral and duodenal bulb polyposis. No significant source to explain transfusion dependent anemia. Colonoscopy with portal colopathy and diffuse edema, changes of Cdiff colitis on colonoscopy   COLONOSCOPY WITH PROPOFOL N/A 01/19/2021   Surgeon: CDaryel November MD; 15 mm polyp in descending colon removed piecemeal, 6 mm polyp in ascending colon and 12 mm polyp in sigmoid colon not removed due to high bleeding risk, benign polypoid lesion at splenic flexure, diverticulosis in sigmoid and descending colon, rectal varices.   COLONOSCOPY WITH PROPOFOL N/A 01/16/2021   Surgeon: RDaneil Dolin MD;  Portal colopathy, large rectal varices, polypoid mass at splenic flexure biopsy, two 1 cm left colon polyps not removed, left-sided diverticulosis.  Reported patient had bleeding from splenic flexure lesion.  Pathology with acutely inflamed colonic mucosa with localized ulceration and prominent reactive changes.   ESOPHAGEAL BANDING N/A 02/03/2018   Procedure: ESOPHAGEAL BANDING;  Surgeon: RDaneil Dolin MD;  [facility];  Service: Endoscopy;  Laterality: N/A;   ESOPHAGEAL BANDING  01/16/2021   Procedure: ESOPHAGEAL BANDING;  Surgeon: RDaneil Dolin MD;  [facility];  Service: Endoscopy;;   ESOPHAGOGASTRODUODENOSCOPY  01/15/2005   Three columns grade 1 to 2 esophageal varices, otherwise normal esophageal mucosa.  Esophagus was not manipulated otherwise./Nodularity of the antrum with overlying erosions, nonspecific finding. Path showed rare H.pylori   ESOPHAGOGASTRODUODENOSCOPY  09/2008   Dr. RGaylene Brookscolumns of grade 2-3 esoph varices,  only one column was prominent. Portal gastropathy, multiple gastrc polyps at antrum, two were 2cm with black eschar, bulbar polyps, bulbar erosions   ESOPHAGOGASTRODUODENOSCOPY  11/2004   Dr. FBrantley Stage3 esoph varices   ESOPHAGOGASTRODUODENOSCOPY  03/31/2012   RMR: 4 columns(3-GR 2, 1-GR1) non-bleeding esophageal varices, portal gastropathy, small HH, early GAVE, multiple gastric polyps    ESOPHAGOGASTRODUODENOSCOPY (EGD) WITH PROPOFOL N/A 02/03/2018   Dr. RGala Romney Esophageal varices, 3 columns grade 1-2.  Portal hypertensive gastropathy.  Multiple gastric polyps, biopsy consistent with hyperplastic.   ESOPHAGOGASTRODUODENOSCOPY (EGD) WITH PROPOFOL N/A 09/19/2019   Fields: grade I and II esophageal varcies, mild portal hypertensive gastropathy, moderate gastritis but no H. pylori, single hyperplastic gastric polyp removed, obvious source for melena/transfusion dependent anemia not identified, may be due to friable gastric and duodenal mucosa in the setting of portal hypertension   ESOPHAGOGASTRODUODENOSCOPY (EGD) WITH PROPOFOL N/A 01/16/2021   Surgeon: RDaneil Dolin MD; Portal hypertensive gastropathy, grade 2 esophageal varices with bleeding stigmata s/p banding x4, slight nodularity and congestion involving bulb and second portion of duodenum, nonspecific.  Recommended repeat EGD in 4 weeks.   ESOPHAGOGASTRODUODENOSCOPY (  EGD) WITH PROPOFOL N/A 05/22/2021   stigmata of prior esophageal banding and no significant varices seen. Portal gastropathy. Polypoid gastric and duodenal mucosa with overlying erosions, marked mucosal friability.   ESOPHAGOGASTRODUODENOSCOPY (EGD) WITH PROPOFOL N/A 02/06/2022   Procedure: ESOPHAGOGASTRODUODENOSCOPY (EGD) WITH PROPOFOL;  Surgeon: Harvel Quale, MD;  [facility];  Service: Gastroenterology;  Laterality: N/A;   GASTRIC VARICES BANDING  03/31/2012   Procedure: GASTRIC VARICES BANDING;  Surgeon: Daneil Dolin, MD;  [facility];  Service: Endoscopy;  Laterality: N/A;   GIVENS CAPSULE STUDY N/A 09/21/2019   Poor study with a lot of debris obstructing much of the view of the first approximate 3 hours out of 6 hours.  No obvious source of bleeding identified.  Sequela of bleeding and old blood seen in the form of"whisps" of blood, blood flecks mostly toward the end of the study.   HEMOSTASIS CLIP PLACEMENT  01/19/2021   Procedure: HEMOSTASIS CLIP PLACEMENT;  Surgeon: [name], MD;  Location: [facility] ENDOSCOPY;  Service: Gastroenterology;;   HEMOSTASIS CLIP PLACEMENT  02/06/2022   Procedure: HEMOSTASIS CLIP PLACEMENT;  Surgeon: Montez Morita, Quillian Quince, MD;  [facility];  Service: Gastroenterology;;  gastric body   HOT HEMOSTASIS  02/06/2022   Procedure: HOT HEMOSTASIS (ARGON PLASMA COAGULATION/BICAP);  Surgeon: Montez Morita, Quillian Quince, MD;  [facility];  Service: Gastroenterology;;   IR REMOVAL TUN CV CATH W/O [facility]  02/15/2020   POLYPECTOMY  09/19/2019   Procedure: POLYPECTOMY;  Surgeon: Danie Binder, MD;  [facility];  Service: Endoscopy;;   POLYPECTOMY  01/19/2021   Procedure: POLYPECTOMY;  Surgeon: [name], MD;  Location: [facility] ENDOSCOPY;  Service: Gastroenterology;;   UMBILICAL HERNIA REPAIR  2017   exploratory laparotomy, abdominal washout   Social History:  reports that he quit smoking about 36 years ago. His smoking use included cigarettes. He has a 50.00 pack-year smoking history. He has never used smokeless tobacco. He reports that he does not drink alcohol and does not use drugs.  Allergies  Allergen Reactions   Tape Itching, Rash and Other (See Comments)    Tears skin    Family History  Problem Relation Age of Onset   Colon cancer Father 83       deceased age 42   Breast cancer Sister        deceased   Diabetes Sister    Stroke Mother    Healthy Son    Healthy Daughter    Lung cancer Neg Hx    Ovarian cancer Neg Hx     Prior to Admission  medications   Medication Sig Start Date End Date Taking? Authorizing Provider  acetaminophen (TYLENOL) 650 MG CR tablet Take 1,300 mg by mouth every 8 (eight) hours as needed for pain.    [provider]  Acidophilus Lactobacillus CAPS Take 1 tablet by mouth daily.    [provider]  Alcohol Swabs (ALCOHOL PREP) 70 % PADS Apply 1 each topically 3 (three) times daily. 01/31/20   [provider]  allopurinol (ZYLOPRIM) 300 MG tablet Take 300 mg by mouth daily. 12/11/21   [provider]  ALPRAZolam Duanne Moron) 0.5 MG tablet Take 1 tablet (0.5 mg total) by mouth See admin instructions. Take one tablet (0.5 mg) by mouth on Monday, Wednesday, Friday before dialysis, may also take one tablet (0.5 mg) daily as needed for anxiety. 02/07/22   Orson Eva, MD  Ascorbic Acid (VITAMIN C) 1000 MG  tablet Take 1,000 mg by mouth in the morning.    [provider]  BD PEN NEEDLE MICRO U/F 32G X 6 MM MISC SMARTSIG:SUB-Q Daily PRN 04/23/21   [provider]  calcium carbonate (TUMS - DOSED IN MG ELEMENTAL CALCIUM) 500 MG chewable tablet Chew 1 tablet by mouth 3 (three) times daily with meals.    [provider]  cetirizine (ZYRTEC) 10 MG tablet Take 10 mg by mouth daily. 06/24/21   [provider]  cholecalciferol (VITAMIN D3) 25 MCG (1000 UNIT) tablet Take 1,000 Units by mouth daily.    [provider]  Continuous Blood Gluc Sensor (FREESTYLE LIBRE 2 SENSOR) MISC Apply topically. 10/15/21   [provider]  diltiazem (TIAZAC) 120 MG 24 hr capsule Take 120 mg by mouth daily. 02/17/22   [provider]  famotidine (PEPCID) 20 MG tablet Take 1 tablet (20 mg total) by mouth 2 (two) times daily. 07/30/21 07/30/22  Barton Dubois, MD  furosemide (LASIX) 40 MG tablet Take 80 mg by mouth daily. 12/29/19   [provider]  GLUCAGEN HYPOKIT 1 MG SOLR injection Inject 1 mg into the muscle once as needed for low blood sugar. 11/28/20    [provider]  HUMALOG KWIKPEN 100 UNIT/ML KwikPen 10-50 Units 3 (three) times daily. Sliding scale 04/23/21   [provider]  hydrOXYzine (ATARAX/VISTARIL) 25 MG tablet Take 25 mg by mouth 3 (three) times daily. For itching 11/23/20   [provider]  iron polysaccharides (NIFEREX) 150 MG capsule Take 150 mg by mouth daily with supper.    [provider]  lactulose (CHRONULAC) 10 GM/15ML solution Take 15 mLs (10 g total) by mouth 2 (two) times daily. 02/07/22   Orson Eva, MD  metoprolol succinate (TOPROL-XL) 25 MG 24 hr tablet Take 1 tablet (25 mg total) by mouth daily. 02/22/22   Orson Eva, MD  mirtazapine (REMERON) 30 MG tablet Take 1 tablet (30 mg total) by mouth at bedtime. 02/10/20   Roxan Hockey, MD  Multiple Vitamin (MULTIVITAMIN WITH MINERALS) TABS tablet Take 1 tablet by mouth in the morning.     [provider]  omega-3 acid ethyl esters (LOVAZA) 1 g capsule Take 2 g by mouth 2 (two) times daily.  04/07/19   [provider]  ondansetron (ZOFRAN) 4 MG tablet Take 4 mg by mouth every 8 (eight) hours as needed for nausea or vomiting.    [provider]  pantoprazole (PROTONIX) 40 MG tablet Take 1 tablet (40 mg total) by mouth 2 (two) times daily. 02/21/22   Orson Eva, MD  pravastatin (PRAVACHOL) 20 MG tablet Take 20 mg by mouth in the morning.     [provider]  rifaximin (XIFAXAN) 550 MG TABS tablet Take 1 tablet (550 mg total) by mouth 2 (two) times daily. 05/02/22   Roxan Hockey, MD  SSD 1 % cream Apply 2 Applications topically 2 (two) times daily. 02/26/22   [provider]  sulfamethoxazole-trimethoprim (BACTRIM DS) 800-160 MG tablet TAKE 1 TABLET BY MOUTH every friday AFTER dialysis 03/12/22   Annitta Needs, NP  thiamine (VITAMIN B-1) 100 MG tablet Take 100 mg by mouth daily. 08/27/20   [provider]  tiZANidine (ZANAFLEX) 2 MG tablet Take 2 mg by mouth 2 (two) times daily as needed.  04/07/22   [provider]  TRESIBA FLEXTOUCH 200 UNIT/ML FlexTouch Pen Inject 10-120 Units into the skin daily. Sliding scale 02/17/22   [provider]  triamcinolone  cream (KENALOG) 0.1 % Apply 1 application topically 2 (two) times daily as needed (dry skin Itching). 12/30/17   [provider]  venlafaxine (EFFEXOR) 75 MG tablet Take 75 mg by mouth 3 (three) times daily with meals.     [provider]  zinc sulfate 220 (50 Zn) MG capsule Take 1 capsule (220 mg total) by mouth daily. 05/03/22   Roxan Hockey, MD    Physical Exam: Vitals:   06/17/2022 1444 06/01/2022 1445 06/11/2022 1508  BP: (!) 134/56 (!) 125/58   Pulse: 84 82 80  Resp:   (!) 23  SpO2: 98% 97% 100%   General: Alert and oriented x 2, no acute distress HEENT: Normocephalic atraumatic, mucous membranes are slightly dry Cardiovascular: Regular rate and rhythm, S1-S2 Lungs: Decreased inspiratory effort, no wheezing, few rhonchi Abdomen: Soft, distended, positive bowel sounds, mild ascites Extremities: No clubbing or cyanosis, trace pitting edema Skin: No skin breaks, tears or lesions Neuro: No focal deficits Psychiatry: Patient is appropriate, no evidence of psychoses  Data Reviewed:  Sodium of 130, glucose of 227, creatinine 3.5 with magnesium of 1.5.  Assessment and Plan: * Acute on chronic respiratory failure with hypoxia (HCC) Baseline of 2 L nasal cannula.  Was requiring BiPAP, currently down to 5 L, secondary to flu as well as some volume overload.  Additional oxygen support, nebulizers.  No wheezing, so we will hold off on steroids at this time  Influenza A Supportive measures.  Will check for COVID and RSV.  Starting Tamiflu.  ESRD on dialysis [facility]) Nephrology consulted.  Gets dialysis on Monday/Wednesday/Friday.  Plan is for session tonight.  Alcoholic cirrhosis of liver with ascites (HCC) Continue lactulose and rifaximin.  Currently no hepatic encephalopathy.  Will consult  IR for tap, once patient is more stable.  In the meantime, volumes can be managed with dialysis.  Esophageal varices in alcoholic cirrhosis (HCC) Currently stable with no evidence of acute bleeding.  Continue Inderal  Acute hyponatremia Secondary to hypervolemia.  Manage volumes with dialysis and Lasix  Controlled type 2 diabetes mellitus with chronic kidney disease on chronic dialysis, with long-term current use of insulin (HCC) Continue insulin and sliding scale.      Advance Care Planning: Full code  Consults: None urology  Family Communication: Will call wife  Severity of Illness: The appropriate patient status for this patient is INPATIENT. Inpatient status is judged to be reasonable and necessary in order to provide the required intensity of service to ensure the patient's safety. The patient's presenting symptoms, physical exam findings, and initial radiographic and laboratory data in the context of their chronic comorbidities is felt to place them at high risk for further clinical deterioration. Furthermore, it is not anticipated that the patient will be medically stable for discharge from the hospital within 2 midnights of admission.   * I certify that at the point of admission it is my clinical judgment that the patient will require inpatient hospital care spanning beyond 2 midnights from the point of admission due to high intensity of service, high risk for further deterioration and high frequency of surveillance required.*  Author: Annita Brod, MD 06/09/2022 6:29 PM  For on call review [email or web address].

## 2022-06-15 NOTE — Telephone Encounter (Signed)
[facility] left a message that they needed a paracentesis order on this patient.  Fax # is [phone]

## 2022-06-15 NOTE — Hospital Course (Addendum)
Jeffrey [name] is a 77 yo male with PMH DMII, HTN, GI bleed, chronic respiratory failure on 2 L [place], atrial fibrillation, cirrhosis, ESRD on HD MWF who originally presented to [facility] on 06/14/2022 for scheduled paracentesis.  Due to having shortness of breath he was unable to lay flat.  Further workup revealed positive for influenza and he required BiPAP briefly.  He was transferred to [facility] for dialysis.

## 2022-06-15 NOTE — Assessment & Plan Note (Addendum)
-  Flu swab positive for H1 -Tamiflu course completed -Continue droplet precautions -Steroids added 06/19/2022 due to worsening oxygen requirements

## 2022-06-15 NOTE — Assessment & Plan Note (Signed)
Secondary to hypervolemia.  Manage volumes with dialysis and Lasix

## 2022-06-15 NOTE — Assessment & Plan Note (Addendum)
-  Appreciate nephrology assistance, continue dialysis per schedule - continue inpatient HD as per nephrology

## 2022-06-15 NOTE — ED Provider Notes (Signed)
[place] EMERGENCY DEPARTMENT Provider Note   CSN: [id number] Arrival date & time: 06/27/2022  1429     History {Add pertinent medical, surgical, social history, OB history to HPI:1} Chief Complaint  Patient presents with   Respiratory Distress    MARS SCHEAFFER is a 77 y.o. 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  Per report provided by EMS the patient was hypoxic at home and brought to [facility] where he was placed on BiPAP overnight for approximately 7 hours.  Per his transfer records, the case was discussed with both nephrologist overnight Dr Hollie Salk as well as the hospitalist here Dr Doristine Bosworth at [facility] who accepted the patient for transfer to the hospital, for both respiratory distress and likely dialysis.    Patient himself feels better on arrival says his breathing is much better.  It is not clear how long he has had the flu for.  He wears 2 L nasal cannula at baseline.  Labs from [facility] paper records show lactate 3.5 -> 1.2.  Flu positive.  Covid and RSV negative.  K 4.2, NA 128, Cr 3.84, BUN 48, Glucose 267, GFR 16.  Xray chest per written report showing cardiomegaly with evidence of small bilateral pleural effusions and acute pulmonary social edema, viral atypical respiratory infection felt less likely.   HPI     Home Medications Prior to Admission medications   Medication Sig Start Date End Date Taking? Authorizing Provider  acetaminophen (TYLENOL) 650 MG CR tablet Take 1,300 mg by mouth every 8 (eight) hours as needed for pain.    [provider]  Acidophilus Lactobacillus CAPS Take 1 tablet by mouth daily.    [provider]  Alcohol Swabs (ALCOHOL PREP) 70 % PADS Apply 1 each topically 3 (three) times daily. 01/31/20   [provider]  allopurinol (ZYLOPRIM) 300 MG tablet Take 300 mg by mouth daily. 12/11/21    [provider]  ALPRAZolam Duanne Moron) 0.5 MG tablet Take 1 tablet (0.5 mg total) by mouth See admin instructions. Take one tablet (0.5 mg) by mouth on Monday, Wednesday, Friday before dialysis, may also take one tablet (0.5 mg) daily as needed for anxiety. 02/07/22   Orson Eva, MD  Ascorbic Acid (VITAMIN C) 1000 MG tablet Take 1,000 mg by mouth in the morning.    [provider]  BD PEN NEEDLE MICRO U/F 32G X 6 MM MISC SMARTSIG:SUB-Q Daily PRN 04/23/21   [provider]  calcium carbonate (TUMS - DOSED IN MG ELEMENTAL CALCIUM) 500 MG chewable tablet Chew 1 tablet by mouth 3 (three) times daily with meals.    [provider]  cetirizine (ZYRTEC) 10 MG tablet Take 10 mg by mouth daily. 06/24/21   [provider]  cholecalciferol (VITAMIN D3) 25 MCG (1000 UNIT) tablet Take 1,000 Units by mouth daily.    [provider]  Continuous Blood Gluc Sensor (FREESTYLE LIBRE 2 SENSOR) MISC Apply topically. 10/15/21   [provider]  diltiazem (TIAZAC) 120 MG 24 hr capsule Take 120 mg by mouth daily. 02/17/22   [provider]  famotidine (PEPCID) 20 MG tablet Take 1 tablet (20 mg total) by mouth 2 (two) times daily. 07/30/21 07/30/22  Barton Dubois, MD  furosemide (LASIX) 40 MG tablet Take 80 mg by mouth daily. 12/29/19   [provider]  GLUCAGEN HYPOKIT 1 MG SOLR injection Inject 1 mg into the  muscle once as needed for low blood sugar. 11/28/20   [provider]  HUMALOG KWIKPEN 100 UNIT/ML KwikPen 10-50 Units 3 (three) times daily. Sliding scale 04/23/21   [provider]  hydrOXYzine (ATARAX/VISTARIL) 25 MG tablet Take 25 mg by mouth 3 (three) times daily. For itching 11/23/20   [provider]  iron polysaccharides (NIFEREX) 150 MG capsule Take 150 mg by mouth daily with supper.    [provider]  lactulose (CHRONULAC) 10 GM/15ML solution Take 15 mLs (10 g total) by mouth 2 (two) times daily. 02/07/22    Orson Eva, MD  metoprolol succinate (TOPROL-XL) 25 MG 24 hr tablet Take 1 tablet (25 mg total) by mouth daily. 02/22/22   Orson Eva, MD  mirtazapine (REMERON) 30 MG tablet Take 1 tablet (30 mg total) by mouth at bedtime. 02/10/20   Roxan Hockey, MD  Multiple Vitamin (MULTIVITAMIN WITH MINERALS) TABS tablet Take 1 tablet by mouth in the morning.     [provider]  omega-3 acid ethyl esters (LOVAZA) 1 g capsule Take 2 g by mouth 2 (two) times daily.  04/07/19   [provider]  ondansetron (ZOFRAN) 4 MG tablet Take 4 mg by mouth every 8 (eight) hours as needed for nausea or vomiting.    [provider]  pantoprazole (PROTONIX) 40 MG tablet Take 1 tablet (40 mg total) by mouth 2 (two) times daily. 02/21/22   Orson Eva, MD  pravastatin (PRAVACHOL) 20 MG tablet Take 20 mg by mouth in the morning.     [provider]  rifaximin (XIFAXAN) 550 MG TABS tablet Take 1 tablet (550 mg total) by mouth 2 (two) times daily. 05/02/22   Roxan Hockey, MD  SSD 1 % cream Apply 2 Applications topically 2 (two) times daily. 02/26/22   [provider]  sulfamethoxazole-trimethoprim (BACTRIM DS) 800-160 MG tablet TAKE 1 TABLET BY MOUTH every friday AFTER dialysis 03/12/22   Annitta Needs, NP  thiamine (VITAMIN B-1) 100 MG tablet Take 100 mg by mouth daily. 08/27/20   [provider]  tiZANidine (ZANAFLEX) 2 MG tablet Take 2 mg by mouth 2 (two) times daily as needed. 04/07/22   [provider]  TRESIBA FLEXTOUCH 200 UNIT/ML FlexTouch Pen Inject 10-120 Units into the skin daily. Sliding scale 02/17/22   [provider]  triamcinolone cream (KENALOG) 0.1 % Apply 1 application topically 2 (two) times daily as needed (dry skin Itching). 12/30/17   [provider]  venlafaxine (EFFEXOR) 75 MG tablet Take 75 mg by mouth 3 (three) times daily with meals.     [provider]  zinc sulfate 220 (50 Zn) MG capsule Take 1 capsule (220 mg total) by  mouth daily. 05/03/22   Roxan Hockey, MD      Allergies    Tape    Review of Systems   Review of Systems  Physical Exam Updated Vital Signs BP (!) 125/58   Pulse 82   SpO2 97%  Physical Exam Constitutional:      General: He is not in acute distress. HENT:     Head: Normocephalic and atraumatic.  Eyes:     Conjunctiva/sclera: Conjunctivae normal.     Pupils: Pupils are equal, round, and reactive to light.  Cardiovascular:     Rate and Rhythm: Normal rate and regular rhythm.  Pulmonary:     Effort: Pulmonary effort is normal. No respiratory distress.     Comments: 6L [place] Abdominal:     General: There  is no distension.     Tenderness: There is no abdominal tenderness.  Skin:    General: Skin is warm and dry.  Neurological:     General: No focal deficit present.     Mental Status: He is alert. Mental status is at baseline.  Psychiatric:        Mood and Affect: Mood normal.        Behavior: Behavior normal.     ED Results / Procedures / Treatments   Labs (all labs ordered are listed, but only abnormal results are displayed) Labs Reviewed  BASIC METABOLIC PANEL  CBC  MAGNESIUM    EKG None  Radiology No results found.  Procedures Procedures  {Document cardiac monitor, telemetry assessment procedure when appropriate:1}  Medications Ordered in ED Medications - No data to display  ED Course/ Medical Decision Making/ A&P Clinical Course as of 06/14/2022 1457  Mon Jun 15, 2022  1454 Dr Moshe Cipro from nephrology consulted - patient to be admitted to hospitalist [MT]    Clinical Course User Index [MT] Leul Narramore, Carola Rhine, MD   {   Click here for ABCD2, HEART and other calculatorsREFRESH Note before signing :1}                          Medical Decision Making Amount and/or Complexity of Data Reviewed Labs: ordered.   Patient arrives as a transfer for medical admission for acute influenza viral respiratory failure in setting of end-stage renal disease  and pulmonary edema.  At this time the patient was weaned off of BiPAP and stable on 5 to 6 L nasal cannula, which is an increased oxygen requirement over his 2 to 3 L baseline.  I reviewed his external records including his printed paper forms and x-ray imaging from [facility].  I believe the patient has received appropriate medications and route to the hospital.  We are able to wean him off of BiPAP onto nasal cannula here and he is breathing more comfortably.  I consulted with nephrology, see ED course, and the patient will likely need to be dialyzed while in the hospital, but may not required emergently as his respiratory status is significantly improved.  He is flu positive.  Supplemental history provided by paramedics.  I ordered repeat labs although he did have blood work done earlier this morning, we will trend his electrolyte levels as well as white blood cell count and hemoglobin.  At this point there is no indication for antibiotics and likely has a viral infection.  There is reported the patient was also requiring paracentesis, which will need to be determined once he is inpatient.  {Document critical care time when appropriate:1} {Document review of labs and clinical decision tools ie heart score, Chads2Vasc2 etc:1}  {Document your independent review of radiology images, and any outside records:1} {Document your discussion with family members, caretakers, and with consultants:1} {Document social determinants of health affecting pt's care:1} {Document your decision making why or why not admission, treatments were needed:1} Final Clinical Impression(s) / ED Diagnoses Final diagnoses:  None    Rx / DC Orders ED Discharge Orders     None

## 2022-06-15 NOTE — Assessment & Plan Note (Signed)
Currently stable with no evidence of acute bleeding.  Continue Inderal

## 2022-06-15 NOTE — Assessment & Plan Note (Addendum)
-  Baseline of 2 L nasal cannula.   -Initially required BiPAP which has been weaned to approximately 5 L - seems to be having some respiratory distress every couple days from volume issues - acute desat on 1/22: ABG obtained 12/15/67/137. Bicarb 25 on BMP; this appears a respi acidosis with attempted metabolic compensation; etiology suspected volume overload from missed HD on Friday; other consideration would be possible PE and/or sedation from encephalopathy but mentation has appeared better over the past 24 hours so seems less likely - needs BIPAP again; may need to tx to unit able to accept

## 2022-06-16 ENCOUNTER — Other Ambulatory Visit: Payer: Self-pay

## 2022-06-16 ENCOUNTER — Encounter (HOSPITAL_COMMUNITY): Payer: Self-pay | Admitting: Internal Medicine

## 2022-06-16 ENCOUNTER — Ambulatory Visit: Payer: Medicare Other

## 2022-06-16 DIAGNOSIS — J9621 Acute and chronic respiratory failure with hypoxia: Secondary | ICD-10-CM | POA: Diagnosis not present

## 2022-06-16 LAB — CBG MONITORING, ED
Glucose-Capillary: 114 mg/dL — ABNORMAL HIGH (ref 70–99)
Glucose-Capillary: 126 mg/dL — ABNORMAL HIGH (ref 70–99)

## 2022-06-16 LAB — RESPIRATORY PANEL BY PCR

## 2022-06-16 LAB — HEPATITIS B SURFACE ANTIBODY, QUANTITATIVE: Hep B S AB Quant (Post): <3.1 m[IU]/mL — ABNORMAL LOW (ref >9.9)

## 2022-06-16 LAB — GLUCOSE, CAPILLARY
Glucose-Capillary: 182 mg/dL — ABNORMAL HIGH (ref 70–99)
Glucose-Capillary: 158 mg/dL — ABNORMAL HIGH (ref 70–99)

## 2022-06-16 MED ORDER — ALBUTEROL SULFATE (2.5 MG/3ML) 0.083% IN NEBU
2.5000 mg | INHALATION_SOLUTION | RESPIRATORY_TRACT | Status: DC | PRN
  Administered 2022-06-17 – 2022-06-27 (×2): 2.5 mg via RESPIRATORY_TRACT
  Filled 2022-06-16 (×2): qty 3

## 2022-06-16 MED ORDER — SALINE SPRAY 0.65 % NA SOLN: 1.0000 | NASAL | Status: DC | PRN

## 2022-06-16 MED ORDER — ACETAMINOPHEN 650 MG RE SUPP: 650.0000 mg | Freq: Four times a day (QID) | RECTAL | Status: DC | PRN

## 2022-06-16 MED ORDER — ONDANSETRON HCL 4 MG PO TABS
4.0000 mg | ORAL_TABLET | Freq: Four times a day (QID) | ORAL | Status: DC | PRN
  Administered 2022-06-18: 4 mg via ORAL
  Filled 2022-06-16: qty 1

## 2022-06-16 MED ORDER — POLYETHYLENE GLYCOL 3350 17 G PO PACK: 17.0000 g | PACK | Freq: Every day | ORAL | Status: DC | PRN

## 2022-06-16 MED ORDER — ACETAMINOPHEN 325 MG PO TABS
650.0000 mg | ORAL_TABLET | Freq: Four times a day (QID) | ORAL | Status: DC | PRN
  Administered 2022-06-20: 650 mg via ORAL
  Filled 2022-06-16: qty 2

## 2022-06-16 MED ORDER — HEPARIN SODIUM (PORCINE) 5000 UNIT/ML IJ SOLN
5000.0000 [IU] | Freq: Three times a day (TID) | INTRAMUSCULAR | Status: DC
  Administered 2022-06-16 – 2022-06-24 (×23): 5000 [IU] via SUBCUTANEOUS
  Filled 2022-06-16 (×23): qty 1

## 2022-06-16 MED ORDER — GUAIFENESIN-DM 100-10 MG/5ML PO SYRP: 5.0000 mL | ORAL_SOLUTION | ORAL | Status: DC | PRN

## 2022-06-16 MED ORDER — ONDANSETRON HCL 4 MG/2ML IJ SOLN: 4.0000 mg | Freq: Four times a day (QID) | INTRAMUSCULAR | Status: DC | PRN

## 2022-06-16 MED ORDER — SALINE SPRAY 0.65 % NA SOLN
1.0000 | NASAL | Status: DC | PRN
  Administered 2022-06-16: 1 via NASAL
  Filled 2022-06-16: qty 44

## 2022-06-16 NOTE — ED Notes (Signed)
Patient's oxygen moved up to 6LPM d/t oxygen saturation of 85%.

## 2022-06-16 NOTE — ED Notes (Signed)
ED TO INPATIENT HANDOFF REPORT  ED Nurse Name and Phone #: Suella Grove [phone]  S Name/Age/Gender Jeffrey [name] 77 y.o. male Room/Bed: 009C/009C  Code Status   Code Status: Full Code  Home/SNF/Other Home Patient oriented to: self, place, time, and situation Is this baseline? Yes   Triage Complete: Triage complete  Chief Complaint Acute on chronic respiratory failure with hypoxia (HCC) [J96.21] Acute respiratory failure with hypoxia ([place]) [J96.01]  Triage Note Pt arrives by [facility] from [facility]. Patient unable to tolerate dialysis and paracentesis today due to respiratory distress. Patient on bipap upon arrival and tolerating well. Pt lactic 3.5 down to 1.2, Ammonia 85, flu-A positive.   Allergies Allergies  Allergen Reactions   Tape Itching, Rash and Other (See Comments)    Tears skin    Level of Care/Admitting Diagnosis ED Disposition     ED Disposition  Admit   Condition  --   Comment  Hospital Area: [place] [[id number]]  Level of Care: Telemetry Medical [[id number]]  May admit patient to [name] or [facility] if equivalent level of care is available:: Yes  Covid Evaluation: Symptomatic Person Under Investigation (PUI) or recent exposure (last 10 days) *Testing Required*  Diagnosis: Acute respiratory failure with hypoxia [facility]) [[id number]]  Admitting Physician: Junius Argyle  Attending Physician: Annita Brod [[id number]]  Certification:: I certify this patient will need inpatient services for at least 2 midnights          B Medical/Surgery History Past Medical History:  Diagnosis Date   A-fib [facility])    when initially stating dialysis january 2021 at [facility] per spouse; never heard anything else about it   Alcoholic cirrhosis ([place])    patient reports completing hep a and b vaccines in 2006   Anemia    has had 3 units of prbcs 2013   Anxiety    Arthritis    Asymptomatic gallstones    Ultrasound in 2006   B12  deficiency 11/21/2020   C. difficile colitis    Cholelithiasis    Chronic kidney disease    Dialysis M/W/F/Sa   Depression    Diabetes ([place])    Duodenal ulcer with hemorrhage    per patient in 2006 or 2007, records have been requested   Dyspnea    low oxygen level -  has O2 2 L all day   Esophageal varices ([place])    see PSH   GERD (gastroesophageal reflux disease)    Heart burn    History of alcohol abuse    quit 05/2013   HOH (hard of hearing)    Hypertension    Liver cirrhosis ([place])    ETOH   SBP (spontaneous bacterial peritonitis) ([place])    2020, November 2021.    Splenomegaly    Ultrasound in 2006   Past Surgical History:  Procedure Laterality Date   AGILE CAPSULE N/A 09/20/2019   Procedure: AGILE CAPSULE;  Surgeon: Daneil Dolin, MD;  [facility];  Service: Endoscopy;  Laterality: N/A;   APPENDECTOMY     AV FISTULA PLACEMENT Left 08/17/2019   Procedure: ARTERIOVENOUS (AV) FISTULA CREATION VERSES ARTERIOVENOUS GRAFT;  Surgeon: [name], MD;  Location: [place];  Service: Vascular;  Laterality: Left;   BASCILIC VEIN TRANSPOSITION Left 09/28/2019   Procedure: LEFT SECOND STAGE [place];  Surgeon: [name], MD;  Location: [place];  Service: Vascular;  Laterality: Left;   BIOPSY  02/03/2018   Procedure:  BIOPSY;  Surgeon: Daneil Dolin, MD;  [facility];  Service: Endoscopy;;  bx of gastric polyps   BIOPSY  09/19/2019   Procedure: BIOPSY;  Surgeon: Danie Binder, MD;  [facility];  Service: Endoscopy;;   BIOPSY  01/16/2021   Procedure: BIOPSY;  Surgeon: Daneil Dolin, MD;  [facility];  Service: Endoscopy;;   CATARACT EXTRACTION Right    COLONOSCOPY  03/10/2005   Rectal polyp as described above, removed with snare. Left sided  diverticula. The remainder of the colonic mucosa appeared normal. Inflammed polyp on path.   COLONOSCOPY  04/1999   Dr. Thea Silversmith polyps removed,  Path showed  hyperplastic   COLONOSCOPY  10/2015   Dr. Britta Mccreedy: diverticulosis, single sessile tubular adenoma 3-31m in size removed from descending colon.    COLONOSCOPY WITH ESOPHAGOGASTRODUODENOSCOPY (EGD)  03/31/2012   RHBZ:JIRCVELAVMs. Colonic diverticulosis. tubular adenoma colon   COLONOSCOPY WITH ESOPHAGOGASTRODUODENOSCOPY (EGD)  06/2019   FORSYTH: small esophageal varices without high risk stigmata, single large AVM on lesser curvature in gastric body s/p APC ablation, gastric antral and duodenal bulb polyposis. No significant source to explain transfusion dependent anemia. Colonoscopy with portal colopathy and diffuse edema, changes of Cdiff colitis on colonoscopy   COLONOSCOPY WITH PROPOFOL N/A 01/19/2021   Surgeon: CDaryel November MD; 15 mm polyp in descending colon removed piecemeal, 6 mm polyp in ascending colon and 12 mm polyp in sigmoid colon not removed due to high bleeding risk, benign polypoid lesion at splenic flexure, diverticulosis in sigmoid and descending colon, rectal varices.   COLONOSCOPY WITH PROPOFOL N/A 01/16/2021   Surgeon: RDaneil Dolin MD;  Portal colopathy, large rectal varices, polypoid mass at splenic flexure biopsy, two 1 cm left colon polyps not removed, left-sided diverticulosis.  Reported patient had bleeding from splenic flexure lesion.  Pathology with acutely inflamed colonic mucosa with localized ulceration and prominent reactive changes.   ESOPHAGEAL BANDING N/A 02/03/2018   Procedure: ESOPHAGEAL BANDING;  Surgeon: RDaneil Dolin MD;  [facility];  Service: Endoscopy;  Laterality: N/A;   ESOPHAGEAL BANDING  01/16/2021   Procedure: ESOPHAGEAL BANDING;  Surgeon: RDaneil Dolin MD;  [facility];  Service: Endoscopy;;   ESOPHAGOGASTRODUODENOSCOPY  01/15/2005   Three columns grade 1 to 2 esophageal varices, otherwise normal esophageal mucosa.  Esophagus was not manipulated otherwise./Nodularity of the antrum with overlying erosions,  nonspecific finding. Path showed rare H.pylori   ESOPHAGOGASTRODUODENOSCOPY  09/2008   Dr. RGaylene Brookscolumns of grade 2-3 esoph varices, only one column was prominent. Portal gastropathy, multiple gastrc polyps at antrum, two were 2cm with black eschar, bulbar polyps, bulbar erosions   ESOPHAGOGASTRODUODENOSCOPY  11/2004   Dr. FBrantley Stage3 esoph varices   ESOPHAGOGASTRODUODENOSCOPY  03/31/2012   RMR: 4 columns(3-GR 2, 1-GR1) non-bleeding esophageal varices, portal gastropathy, small HH, early GAVE, multiple gastric polyps    ESOPHAGOGASTRODUODENOSCOPY (EGD) WITH PROPOFOL N/A 02/03/2018   Dr. RGala Romney Esophageal varices, 3 columns grade 1-2.  Portal hypertensive gastropathy.  Multiple gastric polyps, biopsy consistent with hyperplastic.   ESOPHAGOGASTRODUODENOSCOPY (EGD) WITH PROPOFOL N/A 09/19/2019   Fields: grade I and II esophageal varcies, mild portal hypertensive gastropathy, moderate gastritis but no H. pylori, single hyperplastic gastric polyp removed, obvious source for melena/transfusion dependent anemia not identified, may be due to friable gastric and duodenal mucosa in the setting of portal hypertension   ESOPHAGOGASTRODUODENOSCOPY (EGD) WITH PROPOFOL N/A 01/16/2021   Surgeon: RDaneil Dolin MD; Portal hypertensive gastropathy, grade 2  esophageal varices with bleeding stigmata s/p banding x4, slight nodularity and congestion involving bulb and second portion of duodenum, nonspecific.  Recommended repeat EGD in 4 weeks.   ESOPHAGOGASTRODUODENOSCOPY (EGD) WITH PROPOFOL N/A 05/22/2021   stigmata of prior esophageal banding and no significant varices seen. Portal gastropathy. Polypoid gastric and duodenal mucosa with overlying erosions, marked mucosal friability.   ESOPHAGOGASTRODUODENOSCOPY (EGD) WITH PROPOFOL N/A 02/06/2022   Procedure: ESOPHAGOGASTRODUODENOSCOPY (EGD) WITH PROPOFOL;  Surgeon: Harvel Quale, MD;  [facility];  Service: Gastroenterology;   Laterality: N/A;   GASTRIC VARICES BANDING  03/31/2012   Procedure: GASTRIC VARICES BANDING;  Surgeon: Daneil Dolin, MD;  [facility];  Service: Endoscopy;  Laterality: N/A;   GIVENS CAPSULE STUDY N/A 09/21/2019   Poor study with a lot of debris obstructing much of the view of the first approximate 3 hours out of 6 hours.  No obvious source of bleeding identified.  Sequela of bleeding and old blood seen in the form of"whisps" of blood, blood flecks mostly toward the end of the study.   HEMOSTASIS CLIP PLACEMENT  01/19/2021   Procedure: HEMOSTASIS CLIP PLACEMENT;  Surgeon: [name], MD;  Location: [facility] ENDOSCOPY;  Service: Gastroenterology;;   HEMOSTASIS CLIP PLACEMENT  02/06/2022   Procedure: HEMOSTASIS CLIP PLACEMENT;  Surgeon: Montez Morita, Quillian Quince, MD;  [facility];  Service: Gastroenterology;;  gastric body   HOT HEMOSTASIS  02/06/2022   Procedure: HOT HEMOSTASIS (ARGON PLASMA COAGULATION/BICAP);  Surgeon: Montez Morita, Quillian Quince, MD;  [facility];  Service: Gastroenterology;;   IR REMOVAL TUN CV CATH W/O FL  02/15/2020   POLYPECTOMY  09/19/2019   Procedure: POLYPECTOMY;  Surgeon: Danie Binder, MD;  [facility];  Service: Endoscopy;;   POLYPECTOMY  01/19/2021   Procedure: POLYPECTOMY;  Surgeon: [name], MD;  Location: [facility] ENDOSCOPY;  Service: Gastroenterology;;   UMBILICAL HERNIA REPAIR  2017   exploratory laparotomy, abdominal washout     A IV Location/Drains/Wounds Patient Lines/Drains/Airways Status     Active Line/Drains/Airways     Name Placement date Placement time Site Days   Peripheral IV 06/20/2022 20 G Right Antecubital 06/23/2022  1508  Antecubital  1   Fistula / Graft Left Upper arm Arteriovenous fistula --  --  Upper arm  --   Urethral Catheter 02/05/22  0200  --  131            Intake/Output Last 24 hours  Intake/Output Summary (Last 24 hours) at 06/16/2022 1548 Last data filed at 06/16/2022  0040 Gross per 24 hour  Intake --  Output 3600 ml  Net -3600 ml    Labs/Imaging Results for orders placed or performed during the hospital encounter of 06/21/2022 (from the past 48 hour(s))  Basic metabolic panel     Status: Abnormal   Collection Time: 06/17/2022  3:10 PM  Result Value Ref Range   Sodium 130 (L) 135 - 145 mmol/L   Potassium 4.2 3.5 - 5.1 mmol/L   Chloride 95 (L) 98 - 111 mmol/L   CO2 24 22 - 32 mmol/L   Glucose, Bld 227 (H) 70 - 99 mg/dL    Comment: Glucose reference range applies only to samples taken after fasting for at least 8 hours.   BUN 49 (H) 8 - 23 mg/dL   Creatinine, Ser 3.50 (H) 0.61 - 1.24 mg/dL   Calcium 8.3 (L) 8.9 - 10.3 mg/dL   GFR, Estimated 17 (L) >60 mL/min  Comment: (NOTE) Calculated using the CKD-EPI Creatinine Equation (2021)    Anion gap 11 5 - 15    Comment: Performed at [facility], [place] [address] [place], [place] [address]  CBC     Status: Abnormal   Collection Time: 06/05/2022  3:10 PM  Result Value Ref Range   WBC 7.1 4.0 - 10.5 K/uL   RBC 2.37 (L) 4.22 - 5.81 MIL/uL   Hemoglobin 7.8 (L) 13.0 - 17.0 g/dL   HCT 24.2 (L) 39.0 - 52.0 %   MCV 102.1 (H) 80.0 - 100.0 fL   MCH 32.9 26.0 - 34.0 pg   MCHC 32.2 30.0 - 36.0 g/dL   RDW 21.3 (H) 11.5 - 15.5 %   Platelets 104 (L) 150 - 400 K/uL    Comment: Immature Platelet Fraction may be clinically indicated, consider ordering this additional test [id number] REPEATED TO VERIFY    nRBC 0.0 0.0 - 0.2 %    Comment: Performed at [facility], [place] [address] [place], [place] [address]  Magnesium     Status: Abnormal   Collection Time: 06/04/2022  3:10 PM  Result Value Ref Range   Magnesium 1.5 (L) 1.7 - 2.4 mg/dL    Comment: Performed at [facility] [address], [place], [place] [address]  Hepatitis B surface antibody,quantitative     Status: Abnormal   Collection Time: 07/01/2022  3:46 PM  Result Value Ref Range   Hep B S AB Quant (Post) <3.1 (L) Immunity>9.9 mIU/mL     Comment: (NOTE)  Status of Immunity                     Anti-HBs Level  ------------------                     -------------- Inconsistent with Immunity                   0.0 - 9.9 Consistent with Immunity                          >9.9 Performed At: [facility] [place], [place] [id number] Rush Farmer MD MP:[id number]   Hepatitis B surface antigen     Status: None   Collection Time: 06/14/2022  3:46 PM  Result Value Ref Range   Hepatitis B Surface Ag NON REACTIVE NON REACTIVE    Comment: Performed at [facility] [address], [place], [place] [address]  CBG monitoring, ED     Status: Abnormal   Collection Time: 06/16/22  8:16 AM  Result Value Ref Range   Glucose-Capillary 114 (H) 70 - 99 mg/dL    Comment: Glucose reference range applies only to samples taken after fasting for at least 8 hours.  CBG monitoring, ED     Status: Abnormal   Collection Time: 06/16/22 12:56 PM  Result Value Ref Range   Glucose-Capillary 126 (H) 70 - 99 mg/dL    Comment: Glucose reference range applies only to samples taken after fasting for at least 8 hours.   No results found.  Pending Labs Unresulted Labs (From admission, onward)     Start     Ordered   06/17/22 0500  Comprehensive metabolic panel  Tomorrow morning,   R        06/16/22 1044   06/17/22 0500  CBC  Tomorrow morning,   R  06/16/22 1044   06/20/2022 1828  Respiratory (~20 pathogens) panel by PCR  (Respiratory panel by PCR (~20 pathogens, ~24 hr TAT)  w precautions)  Once,   R        06/28/2022 1828            Vitals/Pain Today's Vitals   06/16/22 0741 06/16/22 0746 06/16/22 1000 06/16/22 1300  BP:  98/85 (!) 160/62 (!) 161/54  Pulse:  100 100 (!) 102  Resp:  (!) 28 (!) 21 (!) 25  Temp:  98.1 F (36.7 C)    TempSrc:  Oral    SpO2:  95% 99% 99%  PainSc: 0-No pain       Isolation Precautions Droplet precaution  Medications Medications  lactulose (CHRONULAC) 10 GM/15ML solution  10 g (10 g Oral Given 06/16/22 0808)  mirtazapine (REMERON) tablet 30 mg (30 mg Oral Not Given 06/06/2022 2200)  rifaximin (XIFAXAN) tablet 550 mg (550 mg Oral Given 06/16/22 0809)  sulfamethoxazole-trimethoprim (BACTRIM DS) 800-160 MG per tablet 1 tablet (has no administration in time range)  pravastatin (PRAVACHOL) tablet 20 mg (20 mg Oral Given 06/16/22 0809)  Chlorhexidine Gluconate Cloth 2 % PADS 6 each (6 each Topical Not Given 06/16/22 0630)  Darbepoetin Alfa (ARANESP) injection 200 mcg (200 mcg Subcutaneous Given 06/12/2022 1854)  heparin injection 5,000 Units (5,000 Units Subcutaneous Given 06/16/22 1543)  ondansetron (ZOFRAN) tablet 4 mg (has no administration in time range)    Or  ondansetron (ZOFRAN) injection 4 mg (has no administration in time range)  polyethylene glycol (MIRALAX / GLYCOLAX) packet 17 g (has no administration in time range)  acetaminophen (TYLENOL) tablet 650 mg (has no administration in time range)    Or  acetaminophen (TYLENOL) suppository 650 mg (has no administration in time range)  albuterol (PROVENTIL) (2.5 MG/3ML) 0.083% nebulizer solution 2.5 mg (has no administration in time range)  guaiFENesin-dextromethorphan (ROBITUSSIN DM) 100-10 MG/5ML syrup 5 mL (has no administration in time range)  oseltamivir (TAMIFLU) capsule 30 mg (30 mg Oral Given 06/16/22 1322)  insulin aspart (novoLOG) injection 0-9 Units (1 Units Subcutaneous Given 06/16/22 1322)  insulin aspart (novoLOG) injection 0-5 Units ( Subcutaneous Not Given 07/01/2022 2200)    Mobility walks with device     Focused Assessments Pulmonary Assessment Handoff:  Lung sounds: Bilateral Breath Sounds: Diminished O2 Device: Nasal Cannula O2 Flow Rate (L/min): 3 L/min    R Recommendations: See Admitting Provider NotePt   Report given to:   Additional Notes: Pt came here from [facility] yesterday. They were able to dialysis oe paracentesis, because of respiratory distress. Pt have graft in left upper  arm, [facility]. Pt is on chronic oxygen at home 3 liters. Saturation can drop with exertion or if he lay flat. [name] He is normally Monday, Wednesday and Friday HD

## 2022-06-16 NOTE — Progress Notes (Signed)
[facility] Progress Note   Assessment/ Plan:   Dialysis Orders:  [facility] MWF 4:00 EDW 80.5kg  350/500 2K/2.5Ca AVF No heparin Mircera 200 (due 1/15)    Assessment/Plan: Dyspnea/respiratory distress -secondary to volume overload/Influenza A +--initially requiring BiPAP-now off. Still with increased O2 requirement. 2L [place] at baseline. HD 1/15, next 1/17 ESRD -  HD MWF. Continue on schedule. Next HD 1/17 Hypertension/volume  - BP stable/ 3-4 UF  Anemia  - Hb 7.8. s/p transfusion on 1/11. ESA ordered Metabolic bone disease -  Follow Ca/Phos here. No VDRA as outpatient  Nutrition - Renal diet with fluid restriction.  Hx of cirrhosis with ascites - per primary team    Subjective:    Seen and examined.  HD last night with 3.6L off.  More comfortable this AM, slept some   Objective:   BP (!) 160/62   Pulse 100   Temp 98.1 F (36.7 C) (Oral)   Resp (!) 21   SpO2 99%   Physical Exam: Gen:NAD, wearing O2 HEENT: + JVD CVS: RRR Resp: clear anteriorly Abd: + protuberant, fluid wave Ext:1+ LE edema ACCESS: L AVF  Labs: BMET Recent Labs  Lab 07/01/2022 1510  NA 130*  K 4.2  CL 95*  CO2 24  GLUCOSE 227*  BUN 49*  CREATININE 3.50*  CALCIUM 8.3*   CBC Recent Labs  Lab 06/11/22 0906 06/01/2022 1510  WBC 4.5 7.1  NEUTROABS 3.9  --   HGB 6.8* 7.8*  HCT 23.3* 24.2*  MCV 109.4* 102.1*  PLT 106* 104*      Medications:     Chlorhexidine Gluconate Cloth  6 each Topical Q0600   darbepoetin (ARANESP) injection - DIALYSIS  200 mcg Subcutaneous Q Mon-1800   heparin  5,000 Units Subcutaneous Q8H   insulin aspart  0-5 Units Subcutaneous QHS   insulin aspart  0-9 Units Subcutaneous TID WC   lactulose  10 g Oral BID   mirtazapine  30 mg Oral QHS   oseltamivir  30 mg Oral Daily   pravastatin  20 mg Oral q AM   rifaximin  550 mg Oral BID   [START ON 06/19/2022] sulfamethoxazole-trimethoprim  1 tablet Oral Weekly     Madelon Lips MD 06/16/2022, 10:57 AM

## 2022-06-16 NOTE — ED Notes (Signed)
Patient was given a cup of ice. 

## 2022-06-16 NOTE — Progress Notes (Signed)
PROGRESS NOTE Jeffrey [name]  RSW:[id number] DOB: May 07, 1946 DOA: 06/13/2022 PCP: Monico Blitz, MD   Brief Narrative/Hospital Course: 76yom w/  DM HTN,GI bleed, chronic respiratory failure on 2 L [place], atrial fibrillation and cirrhosis, ESRD on HD MWF presented to [facility] on 1/14 for scheduled paracentesis.However, patient unable to get procedure done as he was complaining of shortness of breath and unable to lie flat.  He has been complaining of shortness of breath and cough x 2 days.  Workup revealed patient in acute respiratory distress requiring BiPAP,positive for flu. Because no dialysis available at [facility], patient was transferred to [facility] ER due to lack of available beds on floor.By time of arrival to emergency department here, patient down to 5 L nasal cannula.  Nephrology was consulted for volume overload respiratory distress and underwent HD.    Subjective: Seen and examined this morning. Resting comfortably less short of breath BP in 160s on 3 to nasal cannula which is his home setting.   Assessment and Plan: Principal Problem:   Acute on chronic respiratory failure with hypoxia (HCC) Active Problems:   Influenza A   ESRD on dialysis ([place])   Alcoholic cirrhosis of liver with ascites ([place])   Esophageal varices in alcoholic cirrhosis (HCC)   Acute hyponatremia   Controlled type 2 diabetes mellitus with chronic kidney disease on chronic dialysis, with long-term current use of insulin (HCC)   Acute respiratory failure with hypoxia (HCC)  Acute respiratory distress Acute respiratory failure with hypoxia and due to volume overload and influenza A positive: Initially needing BiPAP transitioned to oral cannula oxygen> overnight noted need for higher oxygen.Wean to home setting 2 L.  Fluid management with HD per nephrology.  Continue Tamiflu renally dosed, antitussive I-S/flutter valve  ESRD on HD MWF followed by nephrology underwent HD last  night Hypertension/volume: BP stable> brittle ranging from 90s to 180s.  Meds on hold Metabolic bone disease.  Calcium Phos level non-PTH outpatient. Hyperlipidemia continue pravastatin Anemia of renal disease s/p transfusion on 1/11 continue, ESA. Recent Labs  Lab 06/11/22 0906 06/05/2022 1510  HGB 6.8* 7.8*  HCT 23.3* 09.3*    Alcoholic liver cirrhosis with esophageal varices/ascites without bleeding: Currently stable continue rifaximin, lactulose.  Fluid management per PRN.  Reports he has had several ascitic tapping done gets every 2 months this morning no abdominal pain.  Hyponatremia in the setting of hypervolemia managed with HD Recent Labs  Lab 06/04/2022 1510  NA 130*    Diabetes mellitus type 2: Controlled on sliding scale insulin Recent Labs  Lab 06/16/22 0816  GLUCAP 114*    DVT prophylaxis: heparin injection 5,000 Units Start: 06/16/22 0600 Code Status:   Code Status: Full Code Family Communication: plan of care discussed with patient at bedside. Patient status is: Inpatient because of respiratory failure Level of care: Telemetry Medical   Dispo: The patient is from: home home TBD. Ptot eval requested for disposition  Objective: Vitals last 24 hrs: Vitals:   06/16/22 0418 06/16/22 0609 06/16/22 0746 06/16/22 1000  BP:  (!) 178/50 98/85 (!) 160/62  Pulse:  95 100 100  Resp:  20 (!) 28 (!) 21  Temp:  98.1 F (36.7 C) 98.1 F (36.7 C)   TempSrc:  Oral Oral   SpO2: (!) 89% 90% 95% 99%   Weight change:   Physical Examination:  General exam: alert awake, older than stated age HEENT:Oral mucosa moist, Ear/Nose WNL grossly Respiratory system: bilaterally clear BS, no use of accessory  muscle Cardiovascular system: S1 & S2 +, No JVD. Gastrointestinal system: Abdomen soft,NT, moderately distended, BS+ Nervous System:Alert, awake, moving extremities. Extremities: LE edema none, bruises on upper extremities distal peripheral pulses palpable.  Skin: No rashes,no  icterus. MSK: Normal muscle bulk,tone, power  Medications reviewed:  Scheduled Meds:  Chlorhexidine Gluconate Cloth  6 each Topical Q0600   darbepoetin (ARANESP) injection - DIALYSIS  200 mcg Subcutaneous Q Mon-1800   heparin  5,000 Units Subcutaneous Q8H   insulin aspart  0-5 Units Subcutaneous QHS   insulin aspart  0-9 Units Subcutaneous TID WC   lactulose  10 g Oral BID   mirtazapine  30 mg Oral QHS   oseltamivir  30 mg Oral Daily   pravastatin  20 mg Oral q AM   rifaximin  550 mg Oral BID   [START ON 06/19/2022] sulfamethoxazole-trimethoprim  1 tablet Oral Weekly   Continuous Infusions:    Diet Order             Diet renal with fluid restriction Fluid restriction: 1200 mL Fluid; Room service appropriate? Yes; Fluid consistency: Thin  Diet effective now                            Intake/Output Summary (Last 24 hours) at 06/16/2022 1040 Last data filed at 06/16/2022 0040 Gross per 24 hour  Intake --  Output 3600 ml  Net -3600 ml   Net IO Since Admission: -3,600 mL [06/16/22 1040]  Wt Readings from Last 3 Encounters:  05/01/22 79.6 kg  04/21/22 82.1 kg  04/14/22 78.9 kg     Unresulted Labs (From admission, onward)     Start     Ordered   06/23/2022 1828  Respiratory (~20 pathogens) panel by PCR  (Respiratory panel by PCR (~20 pathogens, ~24 hr TAT)  w precautions)  Once,   R        06/20/2022 1828          Data Reviewed: I have personally reviewed following labs and imaging studies CBC: Recent Labs  Lab 06/11/22 0906 06/28/2022 1510  WBC 4.5 7.1  NEUTROABS 3.9  --   HGB 6.8* 7.8*  HCT 23.3* 24.2*  MCV 109.4* 102.1*  PLT 106* 542*   Basic Metabolic Panel: Recent Labs  Lab 06/19/2022 1510  NA 130*  K 4.2  CL 95*  CO2 24  GLUCOSE 227*  BUN 49*  CREATININE 3.50*  CALCIUM 8.3*  MG 1.5*  No results found for this or any previous visit (from the past 240 hour(s)).  Antimicrobials: Anti-infectives (From admission, onward)    Start     Dose/Rate  Route Frequency Ordered Stop   06/19/22 1600  sulfamethoxazole-trimethoprim (BACTRIM DS) 800-160 MG per tablet 1 tablet       Note to Pharmacy: TAKE 1 TABLET BY MOUTH every friday AFTER dialysis     1 tablet Oral Weekly 06/11/2022 1534     06/28/2022 1845  oseltamivir (TAMIFLU) capsule 30 mg        30 mg Oral Daily 06/27/2022 1826 06/20/22 0959   06/22/2022 1545  rifaximin (XIFAXAN) tablet 550 mg        550 mg Oral 2 times daily 06/06/2022 1534        Culture/Microbiology    Component Value Date/Time   SDES BLOOD RIGHT HAND 04/29/2022 1822   SDES BLOOD RIGHT ANTECUBITAL 04/29/2022 1822   SPECREQUEST  04/29/2022 1822    BOTTLES DRAWN AEROBIC AND  ANAEROBIC Blood Culture adequate volume   SPECREQUEST  04/29/2022 1822    BOTTLES DRAWN AEROBIC AND ANAEROBIC Blood Culture adequate volume   CULT  04/29/2022 1822    NO GROWTH 5 DAYS Performed at [facility], [address], [name], [place] [address]    CULT  04/29/2022 1822    NO GROWTH 5 DAYS Performed at [facility], [address], [place], [place] [address]    REPTSTATUS 05/04/2022 FINAL 04/29/2022 1822   REPTSTATUS 05/04/2022 FINAL 04/29/2022 1822   Radiology Studies: No results found.   LOS: 1 day   Antonieta Pert, MD [facility]  06/16/2022, 10:40 AM

## 2022-06-16 NOTE — ED Notes (Signed)
Patient was placed on bedpan, but only had gas this time. No BM.

## 2022-06-16 NOTE — Progress Notes (Signed)
Pt receives out-pt HD at [facility] on MWF. Pt arrives at 10:45 for 11:00 chair time. Clinic advised today pt is flu A positive. Will assist as needed.   Melven Sartorius Renal Navigator [phone]

## 2022-06-16 NOTE — Progress Notes (Signed)
   06/16/22 0040  Vitals  Temp 98.5 F (36.9 C)  Temp Source Oral  BP (!) 159/52  MAP (mmHg) 79  Pulse Rate 98  ECG Heart Rate 98  Resp (!) 24  Post Treatment  Dialyzer Clearance Heavily streaked  Duration of HD Treatment -hour(s) 4 hour(s)  Liters Processed 95.3  Fluid Removed (mL) 3600 mL  Tolerated HD Treatment Yes  AVG/AVF Arterial Site Held (minutes) 5 minutes  AVG/AVF Venous Site Held (minutes) 5 minutes   TX fin. W/ a late stop on UF due to bp trends.

## 2022-06-16 NOTE — Progress Notes (Signed)
Pt. Refused bipap doesn't wear it

## 2022-06-16 NOTE — Plan of Care (Signed)
  Problem: Education: Goal: Knowledge of General Education information will improve Description: Including pain rating scale, medication(s)/side effects and non-pharmacologic comfort measures Outcome: Progressing   Problem: Clinical Measurements: Goal: Will remain free from infection Outcome: Progressing Goal: Respiratory complications will improve Outcome: Progressing   Problem: Activity: Goal: Risk for activity intolerance will decrease Outcome: Progressing   Problem: Nutrition: Goal: Adequate nutrition will be maintained Outcome: Progressing   Problem: Elimination: Goal: Will not experience complications related to bowel motility Outcome: Progressing   Problem: Skin Integrity: Goal: Risk for impaired skin integrity will decrease Outcome: Progressing

## 2022-06-17 DIAGNOSIS — K7031 Alcoholic cirrhosis of liver with ascites: Secondary | ICD-10-CM | POA: Diagnosis not present

## 2022-06-17 DIAGNOSIS — J9621 Acute and chronic respiratory failure with hypoxia: Secondary | ICD-10-CM | POA: Diagnosis not present

## 2022-06-17 DIAGNOSIS — J101 Influenza due to other identified influenza virus with other respiratory manifestations: Secondary | ICD-10-CM | POA: Diagnosis not present

## 2022-06-17 DIAGNOSIS — N186 End stage renal disease: Secondary | ICD-10-CM | POA: Diagnosis not present

## 2022-06-17 LAB — GLUCOSE, CAPILLARY
Glucose-Capillary: 152 mg/dL — ABNORMAL HIGH (ref 70–99)
Glucose-Capillary: 110 mg/dL — ABNORMAL HIGH (ref 70–99)
Glucose-Capillary: 149 mg/dL — ABNORMAL HIGH (ref 70–99)
Glucose-Capillary: 176 mg/dL — ABNORMAL HIGH (ref 70–99)

## 2022-06-17 LAB — COMPREHENSIVE METABOLIC PANEL
ALT: 26 U/L (ref 0–44)
AST: 35 U/L (ref 15–41)
Albumin: 1.9 g/dL — ABNORMAL LOW (ref 3.5–5.0)
Alkaline Phosphatase: 208 U/L — ABNORMAL HIGH (ref 38–126)
Anion gap: 17 — ABNORMAL HIGH (ref 5–15)
BUN: 37 mg/dL — ABNORMAL HIGH (ref 8–23)
CO2: 23 mmol/L (ref 22–32)
Calcium: 8.3 mg/dL — ABNORMAL LOW (ref 8.9–10.3)
Chloride: 92 mmol/L — ABNORMAL LOW (ref 98–111)
Creatinine, Ser: 2.95 mg/dL — ABNORMAL HIGH (ref 0.61–1.24)
GFR, Estimated: 21 mL/min — ABNORMAL LOW (ref >60)
Glucose, Bld: 142 mg/dL — ABNORMAL HIGH (ref 70–99)
Potassium: 3.5 mmol/L (ref 3.5–5.1)
Sodium: 132 mmol/L — ABNORMAL LOW (ref 135–145)
Total Bilirubin: 1 mg/dL (ref 0.3–1.2)
Total Protein: 5.8 g/dL — ABNORMAL LOW (ref 6.5–8.1)

## 2022-06-17 LAB — CBC
HCT: 26.6 % — ABNORMAL LOW (ref 39.0–52.0)
Hemoglobin: 8.4 g/dL — ABNORMAL LOW (ref 13.0–17.0)
MCH: 31.5 pg (ref 26.0–34.0)
MCHC: 31.6 g/dL (ref 30.0–36.0)
MCV: 99.6 fL (ref 80.0–100.0)
Platelets: 105 10*3/uL — ABNORMAL LOW (ref 150–400)
RBC: 2.67 MIL/uL — ABNORMAL LOW (ref 4.22–5.81)
RDW: 20.7 % — ABNORMAL HIGH (ref 11.5–15.5)
WBC: 6.3 10*3/uL (ref 4.0–10.5)
nRBC: 0.3 % — ABNORMAL HIGH (ref 0.0–0.2)

## 2022-06-17 MED ORDER — OSELTAMIVIR PHOSPHATE 30 MG PO CAPS
30.0000 mg | ORAL_CAPSULE | ORAL | Status: AC
  Administered 2022-06-17 – 2022-06-19 (×2): 30 mg via ORAL
  Filled 2022-06-17 (×4): qty 1

## 2022-06-17 MED ORDER — PENTAFLUOROPROP-TETRAFLUOROETH EX AERO
INHALATION_SPRAY | CUTANEOUS | Status: AC
  Filled 2022-06-17: qty 30

## 2022-06-17 NOTE — Progress Notes (Signed)
Pt received in bed no distress noted stable for HD via LUA AVF ufg 4L

## 2022-06-17 NOTE — Progress Notes (Signed)
Progress Note    Jeffrey [name]   ACZ:[id number]  DOB: [birth year]  DOA: 06/02/2022     2 PCP: Monico Blitz, MD  Initial CC: SOB  Hospital Course: Mr. Jeffrey [name] is a 77 yo male with PMH DMII, HTN, GI bleed, chronic respiratory failure on 2 L [place], atrial fibrillation, cirrhosis, ESRD on HD MWF who originally presented to [facility] on 06/14/2022 for scheduled paracentesis.  Due to having shortness of breath he was unable to lay flat.  Further workup revealed positive for influenza and he required BiPAP briefly.  He was transferred to [facility] for dialysis.  Interval History:  No events overnight.  Tolerated dialysis well today.  Seen in his room after session.  Assessment and Plan: * Acute on chronic respiratory failure with hypoxia (HCC) Baseline of 2 L nasal cannula.  Was requiring BiPAP, currently down to 5 L, secondary to flu as well as some volume overload.  Additional oxygen support, nebulizers.  No wheezing, so we will hold off on steroids at this time  Influenza A - Flu swab positive for H1 - Continue Tamiflu -Continue droplet precautions  ESRD on dialysis [facility]) - Appreciate nephrology assistance, continue dialysis per schedule - Tolerated dialysis well today, 06/09/3233  Alcoholic cirrhosis of liver with ascites (HCC) Continue lactulose and rifaximin.  Currently no hepatic encephalopathy.   - last paracentesis 11/27 - since patient presented to [facility] for paracentesis, will try to order while hospitalized  Esophageal varices in alcoholic cirrhosis (HCC) Currently stable with no evidence of acute bleeding.  Continue Inderal  Acute hyponatremia Secondary to hypervolemia.  Manage volumes with dialysis and Lasix  Controlled type 2 diabetes mellitus with chronic kidney disease on chronic dialysis, with long-term current use of insulin (HCC) Continue insulin and sliding scale.   Old records reviewed in assessment of this patient  Antimicrobials: Tamiflu 06/20/2022 >>  current  DVT prophylaxis:  heparin injection 5,000 Units Start: 06/16/22 0600   Code Status:   Code Status: Full Code  Mobility Assessment (last 72 hours)     Mobility Assessment     Row Name 06/17/22 1619 06/16/22 2145 06/16/22 1805 06/16/22 03:20:08     Does patient have an order for bedrest or is patient medically unstable -- No - Continue assessment No - Continue assessment Yes- Bedfast (Level 1) - Complete    What is the highest level of mobility based on the progressive mobility assessment? Level 4 (Walks with assist in room) - Balance while marching in place and cannot step forward and back - Complete Level 2 (Chairfast) - Balance while sitting on edge of bed and cannot stand Level 2 (Chairfast) - Balance while sitting on edge of bed and cannot stand --    Is the above level different from baseline mobility prior to current illness? -- Yes - Recommend PT order Yes - Recommend PT order --             Barriers to discharge:  Disposition Plan: Home 1 to 2 days Status is: Inpatient  Objective: Blood pressure (!) 162/69, pulse (!) 119, temperature 98.2 F (36.8 C), temperature source Oral, resp. rate 20, weight 70.2 kg, SpO2 100 %.  Examination:  Physical Exam Constitutional:      General: He is not in acute distress.    Appearance: Normal appearance.  HENT:     Head: Normocephalic and atraumatic.     Mouth/Throat:     Mouth: Mucous membranes are moist.  Eyes:  Extraocular Movements: Extraocular movements intact.  Cardiovascular:     Rate and Rhythm: Normal rate and regular rhythm.     Heart sounds: Normal heart sounds.  Pulmonary:     Effort: Pulmonary effort is normal. No respiratory distress.     Breath sounds: No wheezing.     Comments: Coarse breath sounds bilaterally Abdominal:     General: Bowel sounds are normal. There is no distension.     Palpations: Abdomen is soft.     Tenderness: There is no abdominal tenderness.  Musculoskeletal:         General: Normal range of motion.     Cervical back: Normal range of motion and neck supple.  Skin:    General: Skin is warm and dry.  Neurological:     General: No focal deficit present.     Mental Status: He is alert.  Psychiatric:        Mood and Affect: Mood normal.        Behavior: Behavior normal.      Consultants:  Nephrology  Procedures:    Data Reviewed: Results for orders placed or performed during the hospital encounter of 06/14/2022 (from the past 24 hour(s))  Glucose, capillary     Status: Abnormal   Collection Time: 06/16/22  8:56 PM  Result Value Ref Range   Glucose-Capillary 158 (H) 70 - 99 mg/dL  Comprehensive metabolic panel     Status: Abnormal   Collection Time: 06/17/22  4:25 AM  Result Value Ref Range   Sodium 132 (L) 135 - 145 mmol/L   Potassium 3.5 3.5 - 5.1 mmol/L   Chloride 92 (L) 98 - 111 mmol/L   CO2 23 22 - 32 mmol/L   Glucose, Bld 142 (H) 70 - 99 mg/dL   BUN 37 (H) 8 - 23 mg/dL   Creatinine, Ser 2.95 (H) 0.61 - 1.24 mg/dL   Calcium 8.3 (L) 8.9 - 10.3 mg/dL   Total Protein 5.8 (L) 6.5 - 8.1 g/dL   Albumin 1.9 (L) 3.5 - 5.0 g/dL   AST 35 15 - 41 U/L   ALT 26 0 - 44 U/L   Alkaline Phosphatase 208 (H) 38 - 126 U/L   Total Bilirubin 1.0 0.3 - 1.2 mg/dL   GFR, Estimated 21 (L) >60 mL/min   Anion gap 17 (H) 5 - 15  CBC     Status: Abnormal   Collection Time: 06/17/22  4:25 AM  Result Value Ref Range   WBC 6.3 4.0 - 10.5 K/uL   RBC 2.67 (L) 4.22 - 5.81 MIL/uL   Hemoglobin 8.4 (L) 13.0 - 17.0 g/dL   HCT 26.6 (L) 39.0 - 52.0 %   MCV 99.6 80.0 - 100.0 fL   MCH 31.5 26.0 - 34.0 pg   MCHC 31.6 30.0 - 36.0 g/dL   RDW 20.7 (H) 11.5 - 15.5 %   Platelets 105 (L) 150 - 400 K/uL   nRBC 0.3 (H) 0.0 - 0.2 %  Glucose, capillary     Status: Abnormal   Collection Time: 06/17/22  7:44 AM  Result Value Ref Range   Glucose-Capillary 152 (H) 70 - 99 mg/dL  Glucose, capillary     Status: Abnormal   Collection Time: 06/17/22  1:44 PM  Result Value Ref Range    Glucose-Capillary 110 (H) 70 - 99 mg/dL  Glucose, capillary     Status: Abnormal   Collection Time: 06/17/22  4:34 PM  Result Value Ref Range   Glucose-Capillary 149 (H)  70 - 99 mg/dL    I have reviewed pertinent nursing notes, vitals, labs, and images as necessary. I have ordered labwork to follow up on as indicated.  I have reviewed the last notes from staff over past 24 hours. I have discussed patient's care plan and test results with nursing staff, CM/SW, and other staff as appropriate.  Time spent: Greater than 50% of the 55 minute visit was spent in counseling/coordination of care for the patient as laid out in the A&P.   LOS: 2 days   Dwyane Dee, MD [facility] 06/17/2022, 5:39 PM

## 2022-06-17 NOTE — Evaluation (Signed)
Physical Therapy Evaluation Patient Details Name: Jeffrey [name] MRN: [id number] DOB: [birth year] Today's Date: 06/17/2022  History of Present Illness  Patient is a 77 year old male with past medical history of diabetes mellitus, hypertension, GI bleed, chronic respiratory failure on 2 L nasal cannula, atrial fibrillation, ESRD on HD and cirrhosis requiring periodic paracentesis.  Admitted due to SOB/acute respiratory distress found to be positive for flu.  Clinical Impression  Patient presents with decreased mobility due to generalized weakness, decreased cardiorespiratory endurance, decreased balance and poor activity tolerance.  He was able to get up to [facility] and then to rest after standing for hygiene then walk very short distance in the room.  HR elevation to 130's and spO2 down to 83% prior to recovering while on 6L HFNC.  Patient previously living at home with assist from wife.  Feel he should be able to return with family support and follow up HHPT.  PT will continue to follow.        Recommendations for follow up therapy are one component of a multi-disciplinary discharge planning process, led by the attending physician.  Recommendations may be updated based on patient status, additional functional criteria and insurance authorization.  Follow Up Recommendations Home health PT      Assistance Recommended at Discharge Frequent or constant Supervision/Assistance  Patient can return home with the following  A lot of help with bathing/dressing/bathroom;Assistance with cooking/housework;Assist for transportation;Direct supervision/assist for medications management;Help with stairs or ramp for entrance;A little help with walking and/or transfers    Equipment Recommendations None recommended by PT  Recommendations for Other Services       Functional Status Assessment Patient has had a recent decline in their functional status and demonstrates the ability to make significant improvements in  function in a reasonable and predictable amount of time.     Precautions / Restrictions Precautions Precautions: Fall Precaution Comments: watch HR Restrictions Weight Bearing Restrictions: No      Mobility  Bed Mobility Overal bed mobility: Needs Assistance Bed Mobility: Supine to Sit, Sit to Supine     Supine to sit: Supervision Sit to supine: Min guard   General bed mobility comments: assist for lines    Transfers Overall transfer level: Needs assistance Equipment used: None, Rolling walker (2 wheels) Transfers: Sit to/from Stand, Bed to chair/wheelchair/BSC Sit to Stand: Mod assist   Step pivot transfers: Mod assist       General transfer comment: no device for up to [facility] due to urgency; standing with RW some lifting help and cues for hand placement    Ambulation/Gait Ambulation/Gait assistance: Min assist Gait Distance (Feet): 16 Feet Assistive device: Rolling walker (2 wheels) Gait Pattern/deviations: Step-to pattern, Decreased stride length, Decreased dorsiflexion - left, Decreased dorsiflexion - right, Shuffle, Wide base of support, Trunk flexed       General Gait Details: assist for balance, for managing O2/lines and cues for posture, HR up to 130, SpO2 83% on 6L HFNC  Stairs            Wheelchair Mobility    Modified Rankin (Stroke Patients Only)       Balance Overall balance assessment: Needs assistance Sitting-balance support: Feet supported Sitting balance-Leahy Scale: Fair     Standing balance support: Bilateral upper extremity supported Standing balance-Leahy Scale: Poor Standing balance comment: standing holding RW with minguard A and A for perineal hygiene after toileting  Pertinent Vitals/Pain Pain Assessment Pain Assessment: Faces Faces Pain Scale: Hurts little more Pain Location: back Pain Descriptors / Indicators: Aching, Discomfort Pain Intervention(s): Monitored during session     Home Living Family/patient expects to be discharged to:: Private residence Living Arrangements: Spouse/significant other Available Help at Discharge: Family;Available 24 hours/day Type of Home: House Home Access: Ramped entrance       Home Layout: Able to live on main level with bedroom/bathroom Home Equipment: Rolling Walker (2 wheels);Rollator (4 wheels);Cane - single point;Shower seat - built in;Grab bars - tub/shower;BSC/3in1;Wheelchair - manual (up walker)      Prior Function Prior Level of Function : Needs assist             Mobility Comments: household ambulator using rollator, w/c for longer distances, uses 3 LPM O2 at home       Hand Dominance   Dominant Hand: Right    Extremity/Trunk Assessment   Upper Extremity Assessment Upper Extremity Assessment: Generalized weakness    Lower Extremity Assessment Lower Extremity Assessment: Generalized weakness    Cervical / Trunk Assessment Cervical / Trunk Assessment: Kyphotic  Communication   Communication: No difficulties  Cognition Arousal/Alertness: Awake/alert Behavior During Therapy: WFL for tasks assessed/performed Overall Cognitive Status: Impaired/Different from baseline Area of Impairment: Orientation, Safety/judgement, Problem solving, Attention                 Orientation Level: Time Current Attention Level: Selective     Safety/Judgement: Decreased awareness of deficits   Problem Solving: Slow processing          General Comments General comments (skin integrity, edema, etc.): leg bag for urine and applied new lower strap per pt it got lost    Exercises     Assessment/Plan    PT Assessment Patient needs continued PT services  PT Problem List Decreased strength;Decreased balance;Decreased cognition;Decreased activity tolerance;Decreased mobility;Decreased knowledge of use of DME;Decreased knowledge of precautions;Decreased safety awareness       PT Treatment Interventions  DME instruction;Functional mobility training;Balance training;Patient/family education;Gait training;Therapeutic exercise;Therapeutic activities    PT Goals (Current goals can be found in the Care Plan section)  Acute Rehab PT Goals Patient Stated Goal: to return home PT Goal Formulation: With patient Time For Goal Achievement: 07/01/22 Potential to Achieve Goals: Fair    Frequency Min 3X/week     Co-evaluation               AM-PAC PT "6 Clicks" Mobility  Outcome Measure Help needed turning from your back to your side while in a flat bed without using bedrails?: A Little Help needed moving from lying on your back to sitting on the side of a flat bed without using bedrails?: A Little Help needed moving to and from a bed to a chair (including a wheelchair)?: A Lot Help needed standing up from a chair using your arms (e.g., wheelchair or bedside chair)?: A Lot Help needed to walk in hospital room?: Total Help needed climbing 3-5 steps with a railing? : Total 6 Click Score: 12    End of Session Equipment Utilized During Treatment: Gait belt;Oxygen Activity Tolerance: Patient limited by fatigue;Treatment limited secondary to medical complications (Comment) (tachycardia) Patient left: in bed;with call bell/phone within reach;with bed alarm set   PT Visit Diagnosis: Other abnormalities of gait and mobility (R26.89);Muscle weakness (generalized) (M62.81);Other symptoms and signs involving the nervous system (R29.898)    Time: 1534-1610 PT Time Calculation (min) (ACUTE ONLY): 36 min   Charges:   PT Evaluation $  PT Eval Moderate Complexity: 1 Mod PT Treatments $Gait Training: 8-22 mins        Magda Kiel, PT Acute Rehabilitation Services Office:[phone] 06/17/2022]   [name] 06/17/2022, 4:22 PM

## 2022-06-17 NOTE — Progress Notes (Signed)
2140: Contacted on call for patient's BP running high. Systolic was in the upper 170's. No order prescribed as patient is scheduled for dialysis.   2300: BP stabilized and recheck was 165/54

## 2022-06-17 NOTE — Progress Notes (Signed)
OT Cancellation Note  Patient Details Name: SHAFIN POLLIO MRN: [id number] DOB: 10-26-1945   Cancelled Treatment:    Reason Eval/Treat Not Completed: Patient at procedure or test/ unavailable (Pt currently off unit receiving HD. Will re-attempt OT evaluation when able.)  Ailene Ravel, OTR/L,CBIS  Supplemental OT - [facility] and [facility] Secure Chat Preferred   06/17/2022, 8:29 AM

## 2022-06-17 NOTE — Progress Notes (Signed)
PHARMACY NOTE:  ANTIMICROBIAL RENAL DOSAGE ADJUSTMENT  Current antimicrobial regimen includes a mismatch between antimicrobial dosage and estimated renal function.  As per policy approved by the Pharmacy & Therapeutics and Medical Executive Committees, the antimicrobial dosage will be adjusted accordingly.  Current antimicrobial dosage:  Oseltamivir '30mg'$  daily  Indication: influenza A treatment  Renal Function:  Estimated Creatinine Clearance: 20.2 mL/min (A) (by C-G formula based on SCr of 2.95 mg/dL (H)). '[x]'$      On intermittent HD, scheduled: '[]'$      On CRRT    Antimicrobial dosage has been changed to:  Oseltamivir '30mg'$   after HD   Thank you for allowing pharmacy to be a part of this patient's care.  Ardyth Harps, PharmD Clinical Pharmacist

## 2022-06-17 NOTE — Progress Notes (Signed)
Report given to dialysis nurse.

## 2022-06-17 NOTE — Plan of Care (Signed)
  Problem: Education: Goal: Ability to describe self-care measures that may prevent or decrease complications (Diabetes Survival Skills Education) will improve Outcome: Progressing   Problem: Coping: Goal: Ability to adjust to condition or change in health will improve Outcome: Progressing   Problem: Health Behavior/Discharge Planning: Goal: Ability to manage health-related needs will improve Outcome: Progressing   Problem: Education: Goal: Knowledge of General Education information will improve Description: Including pain rating scale, medication(s)/side effects and non-pharmacologic comfort measures Outcome: Progressing

## 2022-06-17 NOTE — Procedures (Signed)
Patient seen and examined on Hemodialysis. The procedure was supervised and I have made appropriate changes. BP (!) 166/77 (BP Location: Left Wrist)   Pulse (!) 112   Temp 98.1 F (36.7 C) (Oral)   Resp 20   Wt 74.3 kg   SpO2 100%   BMI 25.66 kg/m   QB 400 mL/ min via AVF, UF goal 3.5L  Tolerating treatment without complaints at this time.   Madelon Lips MD [facility] [phone] 9:47 AM

## 2022-06-17 NOTE — Progress Notes (Signed)
   06/17/22 1253  Vitals  Temp 98 F (36.7 C)  Pulse Rate (!) 113  Resp (!) 25  BP (!) 158/75  SpO2 100 %  O2 Device HFNC  Weight 70.2 kg  Oxygen Therapy  O2 Flow Rate (L/min) 8 L/min  Patient Activity (if Appropriate) In bed  Post Treatment  Dialyzer Clearance Clear  Duration of HD Treatment -hour(s) 4 hour(s)  Hemodialysis Intake (mL) 0 mL  Liters Processed 92.4  Fluid Removed (mL) 4 mL  Tolerated HD Treatment Yes  AVG/AVF Arterial Site Held (minutes) 8 minutes  AVG/AVF Venous Site Held (minutes) 8 minutes  Oxygen Therapy/Pulse Ox  $ High Flow Nasal Cannula  Yes   Received patient in bed to unit.  Alert and oriented.  Informed consent signed and in chart.   Treatment initiated: 8127 Treatment completed: 1248  Patient tolerated well.  Transported back to the room  Alert, without acute distress.  Hand-off given to patient's nurse.   Access used: LUA AVF Access issues: none  Total UF removed: 4L Medication(s) given: none    Na'Shaminy T [facility]

## 2022-06-17 NOTE — Progress Notes (Signed)
[facility] Progress Note   Assessment/ Plan:   Dialysis Orders:  [facility] MWF 4:00 EDW 80.5kg  350/500 2K/2.5Ca AVF No heparin Mircera 200 (due 1/15)    Assessment/Plan: Dyspnea/respiratory distress -secondary to volume overload/Influenza A +--initially requiring BiPAP-now off. Still with increased O2 requirement. 2L [place] at baseline. HD 1/15, next 1/17 ESRD -  HD MWF. Continue on schedule. Next HD 1/17 Hypertension/volume  - BP stable/ 3-4 UF  Anemia  - Hb 7.8. s/p transfusion on 1/11. ESA ordered Metabolic bone disease -  Follow Ca/Phos here. No VDRA as outpatient  Nutrition - Renal diet with fluid restriction.  Hx of cirrhosis with ascites - per primary team   Flu A +- tamiflu Dispo: pending  Subjective:    Seen and examined.  Continues to feel better.  No issues.     Objective:   BP (!) 166/77 (BP Location: Left Wrist)   Pulse (!) 112   Temp 98.1 F (36.7 C) (Oral)   Resp 20   Wt 74.3 kg   SpO2 100%   BMI 25.66 kg/m   Physical Exam: Gen:NAD, wearing O2 HEENT: JVD improved CVS: RRR Resp: clear anteriorly Abd: + protuberant, fluid wave Ext:1+ LE edema ACCESS: L AVF  Labs: BMET Recent Labs  Lab 06/16/2022 1510 06/17/22 0425  NA 130* 132*  K 4.2 3.5  CL 95* 92*  CO2 24 23  GLUCOSE 227* 142*  BUN 49* 37*  CREATININE 3.50* 2.95*  CALCIUM 8.3* 8.3*   CBC Recent Labs  Lab 06/11/22 0906 06/17/2022 1510 06/17/22 0425  WBC 4.5 7.1 6.3  NEUTROABS 3.9  --   --   HGB 6.8* 7.8* 8.4*  HCT 23.3* 24.2* 26.6*  MCV 109.4* 102.1* 99.6  PLT 106* 104* 105*      Medications:     Chlorhexidine Gluconate Cloth  6 each Topical Q0600   darbepoetin (ARANESP) injection - DIALYSIS  200 mcg Subcutaneous Q Mon-1800   heparin  5,000 Units Subcutaneous Q8H   insulin aspart  0-5 Units Subcutaneous QHS   insulin aspart  0-9 Units Subcutaneous TID WC   lactulose  10 g Oral BID   mirtazapine  30 mg Oral QHS   oseltamivir  30 mg Oral Daily    pentafluoroprop-tetrafluoroeth       pravastatin  20 mg Oral q AM   rifaximin  550 mg Oral BID   [START ON 06/19/2022] sulfamethoxazole-trimethoprim  1 tablet Oral Weekly     Madelon Lips MD 06/17/2022, 9:46 AM

## 2022-06-17 NOTE — Progress Notes (Signed)
Pt. Off unit to HD

## 2022-06-17 NOTE — Progress Notes (Signed)
PT Cancellation Note  Patient Details Name: Jeffrey [name] MRN: [id number] DOB: [birth year]   Cancelled Treatment:    Reason Eval/Treat Not Completed: Patient at procedure or test/unavailable; patient in HD, will attempt later as able.    [name] 06/17/2022, 9:39 AM Magda Kiel, PT Acute Rehabilitation Services Office:[phone] 06/17/2022

## 2022-06-17 NOTE — Progress Notes (Addendum)
RT responded to RN call about pt desatting to upper 80's. RN had [place] on 6L and pt SpO2 was 90. RT changed pt to 8L [place] and pt satting now 96. He reports "feeling better already." RT auscultated bilateral course crackles with diminished bases. Pt WOB and vitals are stable.  Pt is still declining bipap.

## 2022-06-17 NOTE — Progress Notes (Signed)
Called dialysis to advise what time patient will be transported. Per dialysis, awaiting A.M. nurse and transport.

## 2022-06-18 ENCOUNTER — Inpatient Hospital Stay (HOSPITAL_COMMUNITY): Payer: Medicare Other

## 2022-06-18 DIAGNOSIS — J101 Influenza due to other identified influenza virus with other respiratory manifestations: Secondary | ICD-10-CM | POA: Diagnosis not present

## 2022-06-18 DIAGNOSIS — K7031 Alcoholic cirrhosis of liver with ascites: Secondary | ICD-10-CM | POA: Diagnosis not present

## 2022-06-18 DIAGNOSIS — J9621 Acute and chronic respiratory failure with hypoxia: Secondary | ICD-10-CM | POA: Diagnosis not present

## 2022-06-18 DIAGNOSIS — R0603 Acute respiratory distress: Secondary | ICD-10-CM

## 2022-06-18 DIAGNOSIS — N186 End stage renal disease: Secondary | ICD-10-CM | POA: Diagnosis not present

## 2022-06-18 HISTORY — PX: IR PARACENTESIS: IMG2679

## 2022-06-18 LAB — BODY FLUID CELL COUNT WITH DIFFERENTIAL
Eos, Fluid: 0 %
Lymphs, Fluid: 53 %
Monocyte-Macrophage-Serous Fluid: 30 % — ABNORMAL LOW (ref 50–90)
Neutrophil Count, Fluid: 17 % (ref 0–25)
Total Nucleated Cell Count, Fluid: 350 cu mm (ref 0–1000)

## 2022-06-18 LAB — GLUCOSE, CAPILLARY
Glucose-Capillary: 164 mg/dL — ABNORMAL HIGH (ref 70–99)
Glucose-Capillary: 194 mg/dL — ABNORMAL HIGH (ref 70–99)
Glucose-Capillary: 190 mg/dL — ABNORMAL HIGH (ref 70–99)
Glucose-Capillary: 169 mg/dL — ABNORMAL HIGH (ref 70–99)
Glucose-Capillary: 146 mg/dL — ABNORMAL HIGH (ref 70–99)

## 2022-06-18 MED ORDER — METOPROLOL SUCCINATE ER 50 MG PO TB24
25.0000 mg | ORAL_TABLET | Freq: Every day | ORAL | Status: DC
  Administered 2022-06-18 – 2022-06-21 (×4): 25 mg via ORAL
  Filled 2022-06-18 (×4): qty 1

## 2022-06-18 MED ORDER — METOPROLOL SUCCINATE ER 50 MG PO TB24
25.0000 mg | ORAL_TABLET | ORAL | Status: DC
  Filled 2022-06-18 (×2): qty 1

## 2022-06-18 MED ORDER — DILTIAZEM HCL ER COATED BEADS 120 MG PO CP24
120.0000 mg | ORAL_CAPSULE | Freq: Every day | ORAL | Status: DC
  Administered 2022-06-18 – 2022-06-22 (×4): 120 mg via ORAL
  Filled 2022-06-18 (×5): qty 1

## 2022-06-18 MED ORDER — POLYSACCHARIDE IRON COMPLEX 150 MG PO CAPS
150.0000 mg | ORAL_CAPSULE | Freq: Every day | ORAL | Status: DC
  Administered 2022-06-18 – 2022-06-22 (×4): 150 mg via ORAL
  Filled 2022-06-18 (×5): qty 1

## 2022-06-18 MED ORDER — VENLAFAXINE HCL 75 MG PO TABS
75.0000 mg | ORAL_TABLET | Freq: Three times a day (TID) | ORAL | Status: DC
  Administered 2022-06-18 – 2022-06-22 (×8): 75 mg via ORAL
  Filled 2022-06-18 (×15): qty 1

## 2022-06-18 MED ORDER — ALLOPURINOL 100 MG PO TABS
100.0000 mg | ORAL_TABLET | ORAL | Status: DC
  Administered 2022-06-19: 100 mg via ORAL
  Filled 2022-06-18 (×2): qty 1

## 2022-06-18 MED ORDER — LIDOCAINE HCL 1 % IJ SOLN
INTRAMUSCULAR | Status: AC
  Administered 2022-06-18: 10 mL
  Filled 2022-06-18: qty 20

## 2022-06-18 MED ORDER — ALUM & MAG HYDROXIDE-SIMETH 200-200-20 MG/5ML PO SUSP
15.0000 mL | ORAL | Status: DC | PRN
  Administered 2022-06-18: 15 mL via ORAL
  Filled 2022-06-18: qty 30

## 2022-06-18 NOTE — Progress Notes (Signed)
Progress Note    Jeffrey [name]   HQI:[id number]  DOB: August 17, 1945  DOA: 06/26/2022     3 PCP: Monico Blitz, MD  Initial CC: SOB  Hospital Course: [name] is a 77 yo male with PMH DMII, HTN, GI bleed, chronic respiratory failure on 2 L [place], atrial fibrillation, cirrhosis, ESRD on HD MWF who originally presented to [facility] on 06/14/2022 for scheduled paracentesis.  Due to having shortness of breath he was unable to lay flat.  Further workup revealed positive for influenza and he required BiPAP briefly.  He was transferred to [facility] for dialysis.  Interval History:  No events overnight. Tolerated HD today. Underwent paracentesis today too, removed 2.3 L fluid.  Assessment and Plan: * Acute on chronic respiratory failure with hypoxia (HCC) Baseline of 2 L nasal cannula.  Was requiring BiPAP, currently down to 5 L, secondary to flu as well as some volume overload.  Additional oxygen support, nebulizers.  No wheezing, so we will hold off on steroids at this time  Influenza A - Flu swab positive for H1 - Continue Tamiflu -Continue droplet precautions  ESRD on dialysis [facility]) - Appreciate nephrology assistance, continue dialysis per schedule - Tolerated dialysis well today, 1/32/4401  Alcoholic cirrhosis of liver with ascites (HCC) Continue lactulose and rifaximin.  Currently no hepatic encephalopathy.   - last paracentesis 11/27 -Patient originally was planning to undergo paracentesis at [facility] -Paracentesis able to be performed on 06/18/2022, 2.3 L fluid removed.  Follow-up cell count and culture  Esophageal varices in alcoholic cirrhosis (HCC) Currently stable with no evidence of acute bleeding.  Continue Inderal  Acute hyponatremia Secondary to hypervolemia.  Manage volumes with dialysis and Lasix  Controlled type 2 diabetes mellitus with chronic kidney disease on chronic dialysis, with long-term current use of insulin (HCC) Continue insulin and sliding scale.   Old  records reviewed in assessment of this patient  Antimicrobials: Tamiflu 06/12/2022 >> current  DVT prophylaxis:  heparin injection 5,000 Units Start: 06/16/22 0600   Code Status:   Code Status: Full Code  Mobility Assessment (last 72 hours)     Mobility Assessment     Row Name 06/18/22 1056 06/18/22 0900 06/17/22 2200 06/17/22 1619 06/16/22 2145   Does patient have an order for bedrest or is patient medically unstable -- No - Continue assessment No - Continue assessment -- No - Continue assessment   What is the highest level of mobility based on the progressive mobility assessment? Level 5 (Walks with assist in room/hall) - Balance while stepping forward/back and can walk in room with assist - Complete Level 4 (Walks with assist in room) - Balance while marching in place and cannot step forward and back - Complete Level 4 (Walks with assist in room) - Balance while marching in place and cannot step forward and back - Complete Level 4 (Walks with assist in room) - Balance while marching in place and cannot step forward and back - Complete Level 2 (Chairfast) - Balance while sitting on edge of bed and cannot stand   Is the above level different from baseline mobility prior to current illness? -- Yes - Recommend PT order Yes - Recommend PT order -- Yes - Recommend PT order    Row Name 06/16/22 1805 06/16/22 03:20:08         Does patient have an order for bedrest or is patient medically unstable No - Continue assessment Yes- Bedfast (Level 1) - Complete      What  is the highest level of mobility based on the progressive mobility assessment? Level 2 (Chairfast) - Balance while sitting on edge of bed and cannot stand --      Is the above level different from baseline mobility prior to current illness? Yes - Recommend PT order --               Barriers to discharge:  Disposition Plan: Home 1 to 2 days Status is: Inpatient  Objective: Blood pressure (!) 173/75, pulse (!) 110,  temperature 98.4 F (36.9 C), temperature source Oral, resp. rate 20, weight 70.2 kg, SpO2 100 %.  Examination:  Physical Exam Constitutional:      General: He is not in acute distress.    Appearance: Normal appearance.  HENT:     Head: Normocephalic and atraumatic.     Mouth/Throat:     Mouth: Mucous membranes are moist.  Eyes:     Extraocular Movements: Extraocular movements intact.  Cardiovascular:     Rate and Rhythm: Normal rate and regular rhythm.     Heart sounds: Normal heart sounds.  Pulmonary:     Effort: Pulmonary effort is normal. No respiratory distress.     Breath sounds: No wheezing.     Comments: Coarse breath sounds bilaterally Abdominal:     General: Bowel sounds are normal. There is no distension.     Palpations: Abdomen is soft.     Tenderness: There is no abdominal tenderness.  Musculoskeletal:        General: Normal range of motion.     Cervical back: Normal range of motion and neck supple.  Skin:    General: Skin is warm and dry.  Neurological:     General: No focal deficit present.     Mental Status: He is alert.  Psychiatric:        Mood and Affect: Mood normal.        Behavior: Behavior normal.      Consultants:  Nephrology  Procedures:  06/18/22: Paracentesis   Data Reviewed: Results for orders placed or performed during the hospital encounter of 06/27/2022 (from the past 24 hour(s))  Glucose, capillary     Status: Abnormal   Collection Time: 06/17/22  4:34 PM  Result Value Ref Range   Glucose-Capillary 149 (H) 70 - 99 mg/dL  Glucose, capillary     Status: Abnormal   Collection Time: 06/17/22  8:19 PM  Result Value Ref Range   Glucose-Capillary 176 (H) 70 - 99 mg/dL  Glucose, capillary     Status: Abnormal   Collection Time: 06/18/22  8:10 AM  Result Value Ref Range   Glucose-Capillary 164 (H) 70 - 99 mg/dL  Glucose, capillary     Status: Abnormal   Collection Time: 06/18/22  1:32 PM  Result Value Ref Range   Glucose-Capillary 194  (H) 70 - 99 mg/dL    I have reviewed pertinent nursing notes, vitals, labs, and images as necessary. I have ordered labwork to follow up on as indicated.  I have reviewed the last notes from staff over past 24 hours. I have discussed patient's care plan and test results with nursing staff, CM/SW, and other staff as appropriate.  Time spent: Greater than 50% of the 55 minute visit was spent in counseling/coordination of care for the patient as laid out in the A&P.   LOS: 3 days   Dwyane Dee, MD [facility] 06/18/2022, 4:11 PM

## 2022-06-18 NOTE — Care Management Important Message (Signed)
Important Message  Patient Details  Name: Jeffrey [name] MRN: [id number] Date of Birth: 1945-06-30   Medicare Important Message Given:  Yes     Hannah Beat 06/18/2022, 1:17 PM

## 2022-06-18 NOTE — Progress Notes (Signed)
PT Cancellation Note  Patient Details Name: Jeffrey [name] MRN: [id number] DOB: 1945-10-24   Cancelled Treatment:    Reason Eval/Treat Not Completed: Patient at procedure or test/unavailable;Patient declined, no reason specified (Attempted 2x in am and pm. Pt out for procedure in am and then in pm pt declined due to fatigue. Will continue to follow up as able and appropriate.)   Tomma Rakers, DPT, [place] Office: [phone] (Secure chat preferred)   Ander Purpura 06/18/2022, 4:51 PM

## 2022-06-18 NOTE — Progress Notes (Signed)
Physical Therapy Treatment Patient Details Name: Jeffrey [name] MRN: [id number] DOB: 1945/08/03 Today's Date: 06/18/2022   History of Present Illness Patient is a 77 year old male admitted 06/22/2022 due to SOB/acute respiratory distress found to be positive for flu. PMH: DM, HTN, GI bleed, chronic respiratory failure on 2 L nasal cannula, afib, ESRD on HD and cirrhosis requiring periodic paracentesis.    PT Comments    Pt continues to demonstrate deficits in endurance, impacting his tolerance to upright tasks and mobility. His SpO2 read as low as 63% but with a poor pleth. It would rebound to 80s-90s% with a good pleth during seated rest breaks on 4-6L O2. Pt was only able to tolerate ambulating x2 bouts of ~14 ft distances at a time today before fatiguing and needed to sit for the extended period of time it took to brush his dentures at the sink. He displays deficits also in strength impacting his ease with bed mobility and transfers. He would benefit from bed mobility training with the bed flat to simulate his home environment. Will continue to follow acutely. Current recommendations remain appropriate.    Recommendations for follow up therapy are one component of a multi-disciplinary discharge planning process, led by the attending physician.  Recommendations may be updated based on patient status, additional functional criteria and insurance authorization.  Follow Up Recommendations  Home health PT     Assistance Recommended at Discharge Frequent or constant Supervision/Assistance  Patient can return home with the following A lot of help with bathing/dressing/bathroom;Assistance with cooking/housework;Assist for transportation;Direct supervision/assist for medications management;Help with stairs or ramp for entrance;A little help with walking and/or transfers;Direct supervision/assist for financial management   Equipment Recommendations  None recommended by PT    Recommendations for Other  Services       Precautions / Restrictions Precautions Precautions: Fall Precaution Comments: watch HR & SpO2 Restrictions Weight Bearing Restrictions: No     Mobility  Bed Mobility Overal bed mobility: Needs Assistance Bed Mobility: Supine to Sit, Sit to Supine     Supine to sit: HOB elevated, Min guard Sit to supine: Min guard, HOB elevated   General bed mobility comments: Pt requesting HOB to be elevated after trying to sit up with it at a lower elevation. Elevated to 37' and pt able to sit up R EOB using bed rails. Extra time, min guard for safety    Transfers Overall transfer level: Needs assistance Equipment used: Rolling walker (2 wheels) Transfers: Sit to/from Stand Sit to Stand: Min guard           General transfer comment: Cues provided to scoot to edge and push up from sitting surface, x1 from EOB and x1 from commode, min guard for safety    Ambulation/Gait Ambulation/Gait assistance: Min guard, Min assist Gait Distance (Feet): 14 Feet (x2 bouts of ~14 ft each bout) Assistive device: Rolling walker (2 wheels) Gait Pattern/deviations: Step-to pattern, Decreased stride length, Decreased dorsiflexion - left, Decreased dorsiflexion - right, Shuffle, Wide base of support, Trunk flexed Gait velocity: reduced Gait velocity interpretation: <1.31 ft/sec, indicative of household ambulator   General Gait Details: Pt with small, shuffling gait and mild instability but no LOB. MIn guard-minA to maintain balance. Pt fatigues quickly.   Stairs             Wheelchair Mobility    Modified Rankin (Stroke Patients Only)       Balance Overall balance assessment: Needs assistance Sitting-balance support: Feet supported Sitting balance-Leahy Scale: Fair Sitting balance -  Comments: Able to reach off BOS to brush teeth sitting at sink, no assistance needed   Standing balance support: Bilateral upper extremity supported, During functional activity, Reliant on  assistive device for balance Standing balance-Leahy Scale: Poor Standing balance comment: Reliant on RW to ambulate                            Cognition Arousal/Alertness: Awake/alert Behavior During Therapy: WFL for tasks assessed/performed Overall Cognitive Status: Impaired/Different from baseline Area of Impairment: Safety/judgement, Problem solving, Attention                   Current Attention Level: Selective         Problem Solving: Slow processing General Comments: Pt HOH and often answering questions incorrectly due to this. Extra time to process cues. Pt stating he was not sick but just felt "meh". Informed pt he was diagnosed with the flu        Exercises      General Comments General comments (skin integrity, edema, etc.): SpO2 reading as low as 63% but poor pleth, would rebound to 80s-90s% with good pleth with seated rest break on 4-6L O2      Pertinent Vitals/Pain Pain Assessment Pain Assessment: Faces Faces Pain Scale: Hurts a little bit Pain Location: generalized grimacing with mobility Pain Descriptors / Indicators: Grimacing Pain Intervention(s): Limited activity within patient's tolerance, Monitored during session, Repositioned    Home Living                          Prior Function            PT Goals (current goals can now be found in the care plan section) Acute Rehab PT Goals Patient Stated Goal: to feel better PT Goal Formulation: With patient Time For Goal Achievement: 07/01/22 Potential to Achieve Goals: Fair Progress towards PT goals: Progressing toward goals    Frequency    Min 3X/week      PT Plan Current plan remains appropriate    Co-evaluation              AM-PAC PT "6 Clicks" Mobility   Outcome Measure  Help needed turning from your back to your side while in a flat bed without using bedrails?: A Little Help needed moving from lying on your back to sitting on the side of a flat bed  without using bedrails?: A Little Help needed moving to and from a bed to a chair (including a wheelchair)?: A Little Help needed standing up from a chair using your arms (e.g., wheelchair or bedside chair)?: A Little Help needed to walk in hospital room?: Total Help needed climbing 3-5 steps with a railing? : Total 6 Click Score: 14    End of Session Equipment Utilized During Treatment: Oxygen Activity Tolerance: Patient limited by fatigue Patient left: in bed;with call bell/phone within reach;with bed alarm set Nurse Communication: Other (comment) (pt requesting TUMs) PT Visit Diagnosis: Other abnormalities of gait and mobility (R26.89);Muscle weakness (generalized) (M62.81);Other symptoms and signs involving the nervous system (R29.898);Unsteadiness on feet (R26.81)     Time: 0347-4259 PT Time Calculation (min) (ACUTE ONLY): 41 min  Charges:  $Therapeutic Activity: 38-52 mins                     Moishe Spice, PT, DPT Acute Rehabilitation Services  Office: [phone]    Orvan Falconer 06/18/2022, 5:06  PM

## 2022-06-18 NOTE — Progress Notes (Signed)
[facility] Progress Note   Assessment/ Plan:   Dialysis Orders:  [facility] MWF 4:00 EDW 80.5kg  350/500 2K/2.5Ca AVF No heparin Mircera 200 (due 1/15)    Assessment/Plan: Dyspnea/respiratory distress -secondary to volume overload/Influenza A +--initially requiring BiPAP-now off. Still with increased O2 requirement. 2L [place] at baseline. HD 1/15, next 1/17 ESRD -  HD MWF. Continue on schedule. Next HD 1/19 Hypertension/volume  - BP stable/ 3-4 UF  Anemia  - Hb 8.4. s/p transfusion on 1/11. ESA ordered Metabolic bone disease -  Follow Ca/Phos here. No VDRA as outpatient  Nutrition - Renal diet with fluid restriction.  Hx of cirrhosis with ascites - per primary team   Flu A +- tamiflu Dispo: pending  Subjective:    Seen and examined. Dialysis yesterday net UF 4L. Feels better. No concerns today    Objective:   BP (!) 161/63 (BP Location: Right Leg)   Pulse 99   Temp 98.4 F (36.9 C) (Oral)   Resp 19   Wt 70.2 kg   SpO2 98%   BMI 24.24 kg/m   Physical Exam: Gen:NAD, wearing O2 HEENT: JVD improved CVS: RRR Resp: clear anteriorly Abd: + protuberant, fluid wave Ext:1+ LE edema ACCESS: L AVF  Labs: BMET Recent Labs  Lab 06/28/2022 1510 06/17/22 0425  NA 130* 132*  K 4.2 3.5  CL 95* 92*  CO2 24 23  GLUCOSE 227* 142*  BUN 49* 37*  CREATININE 3.50* 2.95*  CALCIUM 8.3* 8.3*    CBC Recent Labs  Lab 06/14/2022 1510 06/17/22 0425  WBC 7.1 6.3  HGB 7.8* 8.4*  HCT 24.2* 26.6*  MCV 102.1* 99.6  PLT 104* 105*       Medications:     [START ON 06/19/2022] allopurinol  100 mg Oral Q M,W,F-2000   Chlorhexidine Gluconate Cloth  6 each Topical Q0600   darbepoetin (ARANESP) injection - DIALYSIS  200 mcg Subcutaneous Q Mon-1800   diltiazem  120 mg Oral Daily   heparin  5,000 Units Subcutaneous Q8H   insulin aspart  0-5 Units Subcutaneous QHS   insulin aspart  0-9 Units Subcutaneous TID WC   iron polysaccharides  150 mg Oral Daily   lactulose  10 g  Oral BID   metoprolol succinate  25 mg Oral QHS   And   [START ON 06/20/2022] metoprolol succinate  25 mg Oral Once per day on Sun Tue Thu Sat   mirtazapine  30 mg Oral QHS   oseltamivir  30 mg Oral Q M,W,F,Sa-1800   pravastatin  20 mg Oral q AM   rifaximin  550 mg Oral BID   [START ON 06/19/2022] sulfamethoxazole-trimethoprim  1 tablet Oral Weekly   venlafaxine  75 mg Oral TID WC   Lynnda Child PA-C [facility] 06/18/2022,11:26 AM

## 2022-06-18 NOTE — Procedures (Signed)
PROCEDURE SUMMARY:  Successful ultrasound guided paracentesis from the left upper quadrant.  Yielded 2.3L of hazy ascitic fluid.  No immediate complications.  The patient tolerated the procedure well.   Specimen was sent for labs.  EBL < 89m  The patient has previously been evaluated by the [facility], and deemed not a candidate for intervention.    Electronically Signed: HPasty Spillers PA 09/11/2021, 2:42 PM

## 2022-06-18 NOTE — Plan of Care (Signed)
  Problem: Metabolic: Goal: Ability to maintain appropriate glucose levels will improve Outcome: Progressing   Problem: Nutritional: Goal: Maintenance of adequate nutrition will improve Outcome: Progressing   Problem: Education: Goal: Knowledge of General Education information will improve Description: Including pain rating scale, medication(s)/side effects and non-pharmacologic comfort measures Outcome: Progressing   Problem: Clinical Measurements: Goal: Diagnostic test results will improve Outcome: Progressing

## 2022-06-18 NOTE — Progress Notes (Signed)
Nurse tech reported patient felt dizzy while standing up to use the bedside commode and started leaning backwards. This caused him to hit his elbow against the bed which caused a tear. Vital signs stable. Patient is alert and oriented. On-call hospitalist notified.

## 2022-06-18 NOTE — Evaluation (Signed)
Occupational Therapy Evaluation Patient Details Name: Jeffrey [name] MRN: [id number] DOB: 01-Sep-1945 Today's Date: 06/18/2022   History of Present Illness Patient is a 77 year old male with past medical history of diabetes mellitus, hypertension, GI bleed, chronic respiratory failure on 2 L nasal cannula, atrial fibrillation, ESRD on HD and cirrhosis requiring periodic paracentesis.  Admitted due to SOB/acute respiratory distress found to be positive for flu.   Clinical Impression   Pt in bed upon therapy arrival and agreeable to participate in OT evaluation. Pt reports that up until this past Saturday he was Mod I for all BADL and functional transfers at home living with his wife. Currently, pt presents with decreased activity tolerance, endurance, and strength requiring increased physical assist to complete BADL tasks and functional transfers. Recommend HHOT at discharge to focus on mentioned deficits. Pt has all necessary equipment at home. Acute OT will continue to follow.      Recommendations for follow up therapy are one component of a multi-disciplinary discharge planning process, led by the attending physician.  Recommendations may be updated based on patient status, additional functional criteria and insurance authorization.   Follow Up Recommendations  Home health OT     Assistance Recommended at Discharge PRN  Patient can return home with the following A little help with walking and/or transfers;A little help with bathing/dressing/bathroom;Help with stairs or ramp for entrance;Assistance with cooking/housework;Assist for transportation    Functional Status Assessment  Patient has had a recent decline in their functional status and demonstrates the ability to make significant improvements in function in a reasonable and predictable amount of time.  Equipment Recommendations  None recommended by OT       Precautions / Restrictions Precautions Precautions: Fall Precaution Comments:  watch HR Restrictions Weight Bearing Restrictions: No      Mobility Bed Mobility Overal bed mobility: Needs Assistance Bed Mobility: Supine to Sit     Supine to sit: Supervision, HOB elevated       Patient Response: Cooperative  Transfers Overall transfer level: Needs assistance Equipment used: Rolling walker (2 wheels) Transfers: Sit to/from Stand, Bed to chair/wheelchair/BSC Sit to Stand: Min guard, From elevated surface     Step pivot transfers: Supervision     General transfer comment: VC for hand placement with RW management prior to sit to stand transition      Balance Overall balance assessment: Needs assistance Sitting-balance support: Feet supported Sitting balance-Leahy Scale: Fair Sitting balance - Comments: sitting EOB   Standing balance support: During functional activity, Single extremity supported Standing balance-Leahy Scale: Poor Standing balance comment: Light reliance on RW for balance when standing to complete toilet hygiene.       ADL either performed or assessed with clinical judgement   ADL Overall ADL's : Needs assistance/impaired Eating/Feeding: Set up;Bed level   Grooming: Wash/dry hands;Wash/dry face;Set up;Sitting   Upper Body Bathing: Set up;Sitting   Lower Body Bathing: Moderate assistance;Sit to/from stand   Upper Body Dressing : Set up;Sitting   Lower Body Dressing: Total assistance;Sit to/from stand;Sitting/lateral leans Lower Body Dressing Details (indicate cue type and reason): donning hospital socks Toilet Transfer: Min guard;Stand-pivot;Rolling walker (2 wheels);BSC/3in1   Toileting- Clothing Manipulation and Hygiene: Total assistance;Sit to/from stand;Cueing for safety;Cueing for sequencing               Vision Baseline Vision/History: 1 Wears glasses (for reading) Ability to See in Adequate Light: 0 Adequate Patient Visual Report: No change from baseline Vision Assessment?: No apparent visual deficits  Pertinent Vitals/Pain Pain Assessment Pain Assessment: No/denies pain Faces Pain Scale: No hurt     Hand Dominance Right   Extremity/Trunk Assessment Upper Extremity Assessment Upper Extremity Assessment: LUE deficits/detail;RUE deficits/detail RUE Deficits / Details: Baseline shoulder deficits due to shoulder dislocation per patient report. Unable to demonstrate more than ~5-10 degrees active shoulder flexion. Very limited with shoulder A/ROM. P/ROM shoulder in all ranges WFL. MMT: elbow flexion/extension: 4/5. weak gross grasp LUE Deficits / Details: A/ROM WFL in all ranges. MMT: shoulder and elbow ranges: 4-/5. Decreased gross grasp.   Lower Extremity Assessment Lower Extremity Assessment: Defer to PT evaluation   Cervical / Trunk Assessment Cervical / Trunk Assessment: Kyphotic   Communication Communication Communication: No difficulties   Cognition Arousal/Alertness: Awake/alert Behavior During Therapy: WFL for tasks assessed/performed Overall Cognitive Status: Difficult to assess       General Comments  leg bag for urine on. Skin noticeably thin on bilateral arms. Several healing skin tears noted.            Home Living Family/patient expects to be discharged to:: Private residence Living Arrangements: Spouse/significant other Available Help at Discharge: Family;Available 24 hours/day Type of Home: House Home Access: Ramped entrance     Home Layout: Able to live on main level with bedroom/bathroom     Bathroom Shower/Tub: Hospital doctor Toilet: Handicapped height Bathroom Accessibility: Yes   Home Equipment: Conservation officer, nature (2 wheels);Rollator (4 wheels);Cane - single point;Shower seat - built in;Grab bars - tub/shower;BSC/3in1;Wheelchair - manual          Prior Functioning/Environment Prior Level of Function : Needs assist       Physical Assist : Mobility (physical);ADLs (physical)     Mobility Comments: household ambulator  using rollator, w/c for longer distances, uses 3 LPM O2 at home ADLs Comments: Pt reports that Wife assisted with donning socks and shoes. He was able to complete everything else for dressing and bath himself.        OT Problem List: Decreased strength;Decreased safety awareness;Decreased activity tolerance;Impaired balance (sitting and/or standing)      OT Treatment/Interventions: Self-care/ADL training;Therapeutic activities;Therapeutic exercise;Neuromuscular education;Energy conservation;Patient/family education;DME and/or AE instruction;Balance training;Manual therapy;Modalities    OT Goals(Current goals can be found in the care plan section) Acute Rehab OT Goals Patient Stated Goal: to get stronger OT Goal Formulation: With patient Time For Goal Achievement: 07/02/22 Potential to Achieve Goals: Good  OT Frequency: Min 2X/week       AM-PAC OT "6 Clicks" Daily Activity     Outcome Measure Help from another person eating meals?: None Help from another person taking care of personal grooming?: None Help from another person toileting, which includes using toliet, bedpan, or urinal?: A Little Help from another person bathing (including washing, rinsing, drying)?: A Little Help from another person to put on and taking off regular upper body clothing?: None Help from another person to put on and taking off regular lower body clothing?: A Lot 6 Click Score: 20   End of Session Equipment Utilized During Treatment: Rolling walker (2 wheels)  Activity Tolerance: Patient tolerated treatment well Patient left: in chair;with call bell/phone within reach  OT Visit Diagnosis: Muscle weakness (generalized) (M62.81);Unsteadiness on feet (R26.81)                Time: 0240-9735 OT Time Calculation (min): 43 min Charges:  OT General Charges $OT Visit: 1 Visit OT Evaluation $OT Eval High Complexity: 1 High OT Treatments $Self Care/Home Management : 23-37 mins  Ailene Ravel, OTR/L,CBIS   Supplemental OT - [facility] and [facility] Secure Chat Preferred    Tymarion Everard, Clarene Duke 06/18/2022, 10:58 AM

## 2022-06-19 ENCOUNTER — Inpatient Hospital Stay (HOSPITAL_COMMUNITY): Payer: Medicare Other

## 2022-06-19 DIAGNOSIS — J101 Influenza due to other identified influenza virus with other respiratory manifestations: Secondary | ICD-10-CM | POA: Diagnosis not present

## 2022-06-19 DIAGNOSIS — N186 End stage renal disease: Secondary | ICD-10-CM | POA: Diagnosis not present

## 2022-06-19 DIAGNOSIS — K7682 Hepatic encephalopathy: Secondary | ICD-10-CM | POA: Diagnosis not present

## 2022-06-19 DIAGNOSIS — J9621 Acute and chronic respiratory failure with hypoxia: Secondary | ICD-10-CM | POA: Diagnosis not present

## 2022-06-19 LAB — CBC WITH DIFFERENTIAL/PLATELET
Abs Immature Granulocytes: 0.04 10*3/uL (ref 0.00–0.07)
Basophils Absolute: 0 10*3/uL (ref 0.0–0.1)
Basophils Relative: 0 %
Eosinophils Absolute: 0 10*3/uL (ref 0.0–0.5)
Eosinophils Relative: 0 %
HCT: 26.9 % — ABNORMAL LOW (ref 39.0–52.0)
Hemoglobin: 8.5 g/dL — ABNORMAL LOW (ref 13.0–17.0)
Immature Granulocytes: 1 %
Lymphocytes Relative: 3 %
Lymphs Abs: 0.3 10*3/uL — ABNORMAL LOW (ref 0.7–4.0)
MCH: 31.4 pg (ref 26.0–34.0)
MCHC: 31.6 g/dL (ref 30.0–36.0)
MCV: 99.3 fL (ref 80.0–100.0)
Monocytes Absolute: 0.3 10*3/uL (ref 0.1–1.0)
Monocytes Relative: 4 %
Neutro Abs: 7.9 10*3/uL — ABNORMAL HIGH (ref 1.7–7.7)
Neutrophils Relative %: 92 %
Platelets: 109 10*3/uL — ABNORMAL LOW (ref 150–400)
RBC: 2.71 MIL/uL — ABNORMAL LOW (ref 4.22–5.81)
RDW: 19.9 % — ABNORMAL HIGH (ref 11.5–15.5)
WBC: 8.6 10*3/uL (ref 4.0–10.5)
nRBC: 0 % (ref 0.0–0.2)

## 2022-06-19 LAB — RENAL FUNCTION PANEL
Albumin: 1.9 g/dL — ABNORMAL LOW (ref 3.5–5.0)
Anion gap: 13 (ref 5–15)
BUN: 41 mg/dL — ABNORMAL HIGH (ref 8–23)
CO2: 30 mmol/L (ref 22–32)
Calcium: 8.7 mg/dL — ABNORMAL LOW (ref 8.9–10.3)
Chloride: 90 mmol/L — ABNORMAL LOW (ref 98–111)
Creatinine, Ser: 3.3 mg/dL — ABNORMAL HIGH (ref 0.61–1.24)
GFR, Estimated: 19 mL/min — ABNORMAL LOW (ref >60)
Glucose, Bld: 167 mg/dL — ABNORMAL HIGH (ref 70–99)
Phosphorus: 5.2 mg/dL — ABNORMAL HIGH (ref 2.5–4.6)
Potassium: 3.2 mmol/L — ABNORMAL LOW (ref 3.5–5.1)
Sodium: 133 mmol/L — ABNORMAL LOW (ref 135–145)

## 2022-06-19 LAB — GLUCOSE, CAPILLARY
Glucose-Capillary: 175 mg/dL — ABNORMAL HIGH (ref 70–99)
Glucose-Capillary: 102 mg/dL — ABNORMAL HIGH (ref 70–99)
Glucose-Capillary: 172 mg/dL — ABNORMAL HIGH (ref 70–99)
Glucose-Capillary: 236 mg/dL — ABNORMAL HIGH (ref 70–99)
Glucose-Capillary: 305 mg/dL — ABNORMAL HIGH (ref 70–99)

## 2022-06-19 LAB — MAGNESIUM: Magnesium: 1.9 mg/dL (ref 1.7–2.4)

## 2022-06-19 MED ORDER — METHYLPREDNISOLONE SODIUM SUCC 40 MG IJ SOLR
40.0000 mg | Freq: Two times a day (BID) | INTRAMUSCULAR | Status: DC
  Administered 2022-06-20 – 2022-06-22 (×6): 40 mg via INTRAVENOUS
  Filled 2022-06-19 (×7): qty 1

## 2022-06-19 MED ORDER — METHYLPREDNISOLONE SODIUM SUCC 125 MG IJ SOLR
125.0000 mg | Freq: Once | INTRAMUSCULAR | Status: AC
  Administered 2022-06-19: 125 mg via INTRAVENOUS
  Filled 2022-06-19: qty 2

## 2022-06-19 MED ORDER — HEPARIN SODIUM (PORCINE) 1000 UNIT/ML DIALYSIS: 1000.0000 [IU] | INTRAMUSCULAR | Status: DC | PRN

## 2022-06-19 MED ORDER — LIDOCAINE HCL (PF) 1 % IJ SOLN: 5.0000 mL | INTRAMUSCULAR | Status: DC | PRN

## 2022-06-19 MED ORDER — LACTULOSE 10 GM/15ML PO SOLN
30.0000 g | Freq: Once | ORAL | Status: AC
  Administered 2022-06-19: 30 g via ORAL

## 2022-06-19 MED ORDER — ANTICOAGULANT SODIUM CITRATE 4% (200MG/5ML) IV SOLN: 5.0000 mL | Status: DC | PRN

## 2022-06-19 MED ORDER — LIDOCAINE-PRILOCAINE 2.5-2.5 % EX CREA: 1.0000 | TOPICAL_CREAM | CUTANEOUS | Status: DC | PRN

## 2022-06-19 MED ORDER — ALTEPLASE 2 MG IJ SOLR: 2.0000 mg | Freq: Once | INTRAMUSCULAR | Status: DC | PRN

## 2022-06-19 MED ORDER — PENTAFLUOROPROP-TETRAFLUOROETH EX AERO: 1.0000 | INHALATION_SPRAY | CUTANEOUS | Status: DC | PRN

## 2022-06-19 NOTE — Progress Notes (Signed)
Progress Note    Jeffrey [name]   EYC:[id number]  DOB: Apr 15, 1946  DOA: 06/16/2022     4 PCP: Monico Blitz, MD  Initial CC: SOB  Hospital Course: Jeffrey [name] is a 77 yo male with PMH DMII, HTN, GI bleed, chronic respiratory failure on 2 L [place], atrial fibrillation, cirrhosis, ESRD on HD MWF who originally presented to [facility] on 06/14/2022 for scheduled paracentesis.  Due to having shortness of breath he was unable to lay flat.  Further workup revealed positive for influenza and he required BiPAP briefly.  He was transferred to [facility] for dialysis.  Interval History:  O2 requirements went up this morning. Has been weaned back down since then and after HD today.  However, he's confused now and appears consistent with hepatic encephalopathy; has only been taking morning dose of lactulose the past few days.  Wife present bedside and updated.   Assessment and Plan: * Acute on chronic respiratory failure with hypoxia (HCC) - Baseline of 2 L nasal cannula.   -Initially required BiPAP which has been weaned to approximately 5 L - Continue weaning back to home settings as able  Influenza A - Flu swab positive for H1 - Continue Tamiflu -Continue droplet precautions -Steroids added 06/19/2022 due to worsening oxygen requirements  Acute hepatic encephalopathy ([place]) - Patient developed worsening confusion on 06/19/2022 - Has been refusing evening dose of lactulose.  Likely has developed hepatic encephalopathy - Also missed this morning's dose as he was in dialysis; seen in his room after dialysis and he is overtly confused - Will give 30 g dose x 1 now and resume home regimen - Check ammonia level in a.m.  ESRD on dialysis [facility]) - Appreciate nephrology assistance, continue dialysis per schedule - continue inpatient HD as per nephrology   Alcoholic cirrhosis of liver with ascites ([place]) Continue lactulose and rifaximin.  Currently no hepatic encephalopathy.   - last paracentesis  11/27 -Patient originally was planning to undergo paracentesis at [facility] -Paracentesis able to be performed on 06/18/2022, 2.3 L fluid removed; negative for SBP per PMN count; culture negative  Esophageal varices in alcoholic cirrhosis (HCC) Currently stable with no evidence of acute bleeding.  Continue Inderal  Acute hyponatremia Secondary to hypervolemia.  Manage volumes with dialysis and Lasix  Controlled type 2 diabetes mellitus with chronic kidney disease on chronic dialysis, with long-term current use of insulin (HCC) Continue insulin and sliding scale.   Old records reviewed in assessment of this patient  Antimicrobials: Tamiflu 06/21/2022 >> current  DVT prophylaxis:  heparin injection 5,000 Units Start: 06/16/22 0600   Code Status:   Code Status: Full Code  Mobility Assessment (last 72 hours)     Mobility Assessment     Row Name 06/18/22 2030 06/18/22 1657 06/18/22 1056 06/18/22 0900 06/17/22 2200   Does patient have an order for bedrest or is patient medically unstable No - Continue assessment -- -- No - Continue assessment No - Continue assessment   What is the highest level of mobility based on the progressive mobility assessment? Level 4 (Walks with assist in room) - Balance while marching in place and cannot step forward and back - Complete Level 5 (Walks with assist in room/hall) - Balance while stepping forward/back and can walk in room with assist - Complete Level 5 (Walks with assist in room/hall) - Balance while stepping forward/back and can walk in room with assist - Complete Level 4 (Walks with assist in room) - Balance while marching in  place and cannot step forward and back - Complete Level 4 (Walks with assist in room) - Balance while marching in place and cannot step forward and back - Complete   Is the above level different from baseline mobility prior to current illness? Yes - Recommend PT order -- -- Yes - Recommend PT order Yes - Recommend PT order    [place]  Name 06/17/22 1619 06/16/22 2145 06/16/22 1805       Does patient have an order for bedrest or is patient medically unstable -- No - Continue assessment No - Continue assessment     What is the highest level of mobility based on the progressive mobility assessment? Level 4 (Walks with assist in room) - Balance while marching in place and cannot step forward and back - Complete Level 2 ([place]) - Balance while sitting on edge of bed and cannot stand Level 2 ([place]) - Balance while sitting on edge of bed and cannot stand     Is the above level different from baseline mobility prior to current illness? -- Yes - Recommend PT order Yes - Recommend PT order              Barriers to discharge:  Disposition Plan: Home 1 to 2 days Status is: Inpatient  Objective: Blood pressure 135/67, pulse (!) 108, temperature 97.9 F (36.6 C), temperature source Oral, resp. rate 20, weight 66.3 kg, SpO2 (!) 87 %.  Examination:  Physical Exam Constitutional:      General: He is not in acute distress.    Comments: Confused appearing and pulling at O2 sensor  HENT:     Head: Normocephalic and atraumatic.     Mouth/Throat:     Mouth: Mucous membranes are moist.  Eyes:     Extraocular Movements: Extraocular movements intact.  Cardiovascular:     Rate and Rhythm: Normal rate and regular rhythm.     Heart sounds: Normal heart sounds.  Pulmonary:     Effort: Pulmonary effort is normal. No respiratory distress.     Breath sounds: No wheezing.     Comments: Coarse breath sounds bilaterally Abdominal:     General: Bowel sounds are normal. There is no distension.     Palpations: Abdomen is soft.     Tenderness: There is no abdominal tenderness.  Musculoskeletal:        General: Normal range of motion.     Cervical back: Normal range of motion and neck supple.  Skin:    General: Skin is warm and dry.  Neurological:     Mental Status: He is alert. He is disoriented.  Psychiatric:        Mood and  Affect: Mood normal.        Behavior: Behavior normal.      Consultants:  Nephrology  Procedures:  06/18/22: Paracentesis   Data Reviewed: Results for orders placed or performed during the hospital encounter of 06/05/2022 (from the past 24 hour(s))  Glucose, capillary     Status: Abnormal   Collection Time: 06/18/22  4:09 PM  Result Value Ref Range   Glucose-Capillary 190 (H) 70 - 99 mg/dL  Glucose, capillary     Status: Abnormal   Collection Time: 06/18/22  7:43 PM  Result Value Ref Range   Glucose-Capillary 169 (H) 70 - 99 mg/dL  Glucose, capillary     Status: Abnormal   Collection Time: 06/18/22  8:39 PM  Result Value Ref Range   Glucose-Capillary 146 (H) 70 - 99 mg/dL  Renal function panel     Status: Abnormal   Collection Time: 06/19/22  4:30 AM  Result Value Ref Range   Sodium 133 (L) 135 - 145 mmol/L   Potassium 3.2 (L) 3.5 - 5.1 mmol/L   Chloride 90 (L) 98 - 111 mmol/L   CO2 30 22 - 32 mmol/L   Glucose, Bld 167 (H) 70 - 99 mg/dL   BUN 41 (H) 8 - 23 mg/dL   Creatinine, Ser 3.30 (H) 0.61 - 1.24 mg/dL   Calcium 8.7 (L) 8.9 - 10.3 mg/dL   Phosphorus 5.2 (H) 2.5 - 4.6 mg/dL   Albumin 1.9 (L) 3.5 - 5.0 g/dL   GFR, Estimated 19 (L) >60 mL/min   Anion gap 13 5 - 15  CBC with Differential/Platelet     Status: Abnormal   Collection Time: 06/19/22  4:30 AM  Result Value Ref Range   WBC 8.6 4.0 - 10.5 K/uL   RBC 2.71 (L) 4.22 - 5.81 MIL/uL   Hemoglobin 8.5 (L) 13.0 - 17.0 g/dL   HCT 26.9 (L) 39.0 - 52.0 %   MCV 99.3 80.0 - 100.0 fL   MCH 31.4 26.0 - 34.0 pg   MCHC 31.6 30.0 - 36.0 g/dL   RDW 19.9 (H) 11.5 - 15.5 %   Platelets 109 (L) 150 - 400 K/uL   nRBC 0.0 0.0 - 0.2 %   Neutrophils Relative % 92 %   Neutro Abs 7.9 (H) 1.7 - 7.7 K/uL   Lymphocytes Relative 3 %   Lymphs Abs 0.3 (L) 0.7 - 4.0 K/uL   Monocytes Relative 4 %   Monocytes Absolute 0.3 0.1 - 1.0 K/uL   Eosinophils Relative 0 %   Eosinophils Absolute 0.0 0.0 - 0.5 K/uL   Basophils Relative 0 %    Basophils Absolute 0.0 0.0 - 0.1 K/uL   Immature Granulocytes 1 %   Abs Immature Granulocytes 0.04 0.00 - 0.07 K/uL  Magnesium     Status: None   Collection Time: 06/19/22  4:30 AM  Result Value Ref Range   Magnesium 1.9 1.7 - 2.4 mg/dL  Glucose, capillary     Status: Abnormal   Collection Time: 06/19/22  8:12 AM  Result Value Ref Range   Glucose-Capillary 175 (H) 70 - 99 mg/dL  Glucose, capillary     Status: Abnormal   Collection Time: 06/19/22  1:17 PM  Result Value Ref Range   Glucose-Capillary 102 (H) 70 - 99 mg/dL    I have reviewed pertinent nursing notes, vitals, labs, and images as necessary. I have ordered labwork to follow up on as indicated.  I have reviewed the last notes from staff over past 24 hours. I have discussed patient's care plan and test results with nursing staff, CM/SW, and other staff as appropriate.  Time spent: Greater than 50% of the 55 minute visit was spent in counseling/coordination of care for the patient as laid out in the A&P.   LOS: 4 days   Dwyane Dee, MD [facility] 06/19/2022, 3:40 PM

## 2022-06-19 NOTE — Progress Notes (Signed)
PT Cancellation Note  Patient Details Name: Jeffrey [name] MRN: [id number] DOB: 01/23/1946   Cancelled Treatment:    Reason Eval/Treat Not Completed: Patient at procedure or test/unavailable; patient in HD.  Will attempt later as schedule permits and pt able.    Jeffrey [name] 06/19/2022, 9:27 AM Magda Kiel, PT Acute Rehabilitation Services Office:[phone] 06/19/2022

## 2022-06-19 NOTE — TOC Initial Note (Signed)
Transition of Care [facility]) - Initial/Assessment Note    Patient Details  Name: Jeffrey [name] MRN: [id number] Date of Birth: Oct 09, 1945  Transition of Care [facility]) CM/SW Contact:    Joanne Chars, LCSW Phone Number: 06/19/2022, 3:40 PM  Clinical Narrative:   Pt oriented x2, CSW spoke with wife [name].  Pt is from home, lives with wife and grandson.  [facility] services currently in place.  Discussed PT recommendation for more HH and wife would like to continue services with [facility].  Current DME in home: 2 walkers, wheelchair, shower chair.  No DME needs.  TOC will continue to follow.                 Expected Discharge Plan: [place] Barriers to Discharge: Continued Medical Work up   Patient Goals and CMS Choice     Choice offered to / list presented to : Spouse      Expected Discharge Plan and Services In-house Referral: Clinical Social Work   Post Acute Care Choice: [place] arrangements for the past 2 months: [place]                                      Prior Living Arrangements/Services Living arrangements for the past 2 months: Single Family Home Lives with:: Spouse, Relatives (grandson) Patient language and need for interpreter reviewed:: No        Need for Family Participation in Patient Care: Yes (Comment) Care giver support system in place?: Yes (comment) Current home services: Home OT, Home PT, Home RN ([facility]) Criminal Activity/Legal Involvement Pertinent to Current Situation/Hospitalization: No - Comment as needed  Activities of Daily Living Home Assistive Devices/Equipment: None ADL Screening (condition at time of admission) Patient's cognitive ability adequate to safely complete daily activities?: Yes Is the patient deaf or have difficulty hearing?: Yes Does the patient have difficulty seeing, even when wearing glasses/contacts?: Yes Does the patient have difficulty concentrating, remembering, or  making decisions?: No Patient able to express need for assistance with ADLs?: No Does the patient have difficulty dressing or bathing?: No Independently performs ADLs?: Yes (appropriate for developmental age) Does the patient have difficulty walking or climbing stairs?: No Weakness of Legs: None Weakness of Arms/Hands: None  Permission Sought/Granted                  Emotional Assessment Appearance:: Appears stated age Attitude/Demeanor/Rapport: Unable to Assess Affect (typically observed): Unable to Assess Orientation: : Oriented to Self, Oriented to Place      Admission diagnosis:  Acute on chronic respiratory failure with hypoxia (HCC) [J96.21] Acute respiratory failure with hypoxia ([place]) [J96.01] Patient Active Problem List   Diagnosis Date Noted   Acute on chronic respiratory failure with hypoxia ([place]) 06/06/2022   Influenza A 06/08/2022   Acute respiratory failure with hypoxia ([place]) 06/16/2022   Sepsis due to pneumonia ([place]) 04/22/2022   Chronic respiratory failure with hypoxia (HCC) 02/20/2022   Rectal bleeding 02/19/2022   Symptomatic anemia 07/28/2021   Hypokalemia 07/28/2021   Hypomagnesemia 07/28/2021   Rectal varices    Hyponatremia 01/17/2021   Thrombocytopenia ([place]) 01/17/2021   B12 deficiency 11/21/2020   Iron deficiency anemia due to chronic blood loss 06/25/2020   Gastroesophageal reflux disease 06/06/2020   Bacteremia 04/03/2020   Acute on chronic respiratory failure with hypoxia and hypercapnia ([place]) 04/03/2020   Lobar pneumonia ([place])  04/03/2020   Volume overload 04/02/2020   Asymptomatic bacteriuria 04/02/2020   Pain of upper abdomen 02/22/2020   Urine leukocytes increased 02/22/2020   Minor head injury    Physical deconditioning    Acute hyponatremia 02/19/2020   Fever and chills 02/09/2020   Advanced care planning/counseling discussion    DNR (do not resuscitate)    Alcoholic cirrhosis of liver ([place]) 02/08/2020   Back pain 02/08/2020    Status post thoracentesis    Bilateral pleural effusion 12/31/2019   Hypoglycemia 12/31/2019   Hyperammonemia ([place]) 12/31/2019   Class 2 obesity 12/31/2019   Hypoalbuminemia 12/31/2019   Nosebleed    Goals of care, counseling/discussion    Palliative care by specialist    Acute hepatic encephalopathy ([place])    Hyperkalemia    Chronic hyponatremia    Anemia 11/16/2019   GI bleed    Generalized weakness 09/18/2019   Controlled type 2 diabetes mellitus with chronic kidney disease on chronic dialysis, with long-term current use of insulin ([place]) 09/18/2019   ESRD on dialysis ([place]) 09/18/2019   Clostridioides difficile infection 08/31/2019   Edema 39/07/90   Alcoholic cirrhosis ([place]) 33/00/7622   Essential hypertension, benign 10/20/2017   Mixed hyperlipidemia 10/20/2017   Ascites    Shigella dysenteriae 01/11/2016   Diarrhea in adult patient 01/10/2016   Liver cirrhosis ([place]) 63/33/5456   Alcoholic cirrhosis of liver with ascites ([place]) 03/09/2012   Heme positive stool 03/09/2012   Anemia due to chronic blood loss 03/09/2012   Pancytopenia ([place]) 03/09/2012   FH: colon cancer 03/09/2012   Esophageal varices in alcoholic cirrhosis ([place]) 25/63/8937   PCP:  Monico Blitz, MD Pharmacy:   [facility] [place] [address] [place] [address] Phone: [phone] Fax: [phone]     Social Determinants of Health (SDOH) Social History: SDOH Screenings   Food Insecurity: No Food Insecurity (06/16/2022)  Housing: Low Risk  (06/16/2022)  Transportation Needs: No Transportation Needs (06/16/2022)  Utilities: Not At Risk (06/16/2022)  Depression (PHQ2-9): Low Risk  (03/27/2021)  Physical Activity: Inactive (05/30/2020)  Tobacco Use: Medium Risk (06/18/2022)   SDOH Interventions:     Readmission Risk Interventions    04/30/2022   10:22 AM 02/07/2022   12:34 PM 01/20/2021    2:33 PM  Readmission Risk Prevention Plan  Transportation Screening  Complete Complete Complete  PCP or Specialist Appt within 3-5 Days Not Complete Complete Complete  HRI or Home Care Consult Complete Complete Complete  Social Work Consult for [place] Planning/Counseling Complete Complete Complete  Palliative Care Screening Complete Not Applicable Not Applicable  Medication Review Press photographer) Complete Complete Complete

## 2022-06-19 NOTE — Assessment & Plan Note (Addendum)
-  Patient developed worsening confusion on 06/19/2022 - Has been refusing evening dose of lactulose.  Likely has developed hepatic encephalopathy - NH3 elevated as expected at 42 - will convert to lactulose enema if necessary - monitor mentation  -Increase lactulose dose 06/21/2022

## 2022-06-19 NOTE — Procedures (Signed)
Patient seen and examined on Hemodialysis. The procedure was supervised and I have made appropriate changes. BP 129/60   Pulse 93   Temp 97.7 F (36.5 C) (Oral)   Resp (!) 23   Wt 66.3 kg   SpO2 98%   BMI 22.89 kg/m   QB 400 mL/ min via AVF, UF goal 3L.  Tolerating treatment without complaints at this time.   Madelon Lips MD [place] [facility] [phone] 10:03 AM

## 2022-06-19 NOTE — Progress Notes (Signed)
OT Cancellation Note  Patient Details Name: Jeffrey [name] MRN: [id number] DOB: 07-28-1945   Cancelled Treatment:    Reason Eval/Treat Not Completed: Patient at procedure or test/ unavailable (Pt off unit receiving HD this AM. WIll re-attempt OT treatment session at a later time as able.)  Ailene Ravel, OTR/L,CBIS  Supplemental OT - [place] and [facility] Secure Chat Preferred   06/19/2022, 9:34 AM

## 2022-06-19 NOTE — Progress Notes (Signed)
   06/19/22 1329  Neurological  Level of Consciousness Alert  Orientation Level Oriented to person;Oriented to place  Respiratory  Respiratory Pattern Regular;Unlabored  Chest Assessment Chest expansion symmetrical  Bilateral Breath Sounds Clear;Diminished  Cardiac  Pulse Regular  Heart Sounds S1, S2  Jugular Venous Distention (JVD) No  ECG Monitor Yes  Cardiac Rhythm NSR  Vascular  R Radial Pulse +2  L Radial Pulse +2  Integumentary  Integumentary (WDL) X  Skin Color Appropriate for ethnicity  Skin Condition Dry  Skin Integrity Ecchymosis  Musculoskeletal  Musculoskeletal (WDL) X  Generalized Weakness Yes  Gastrointestinal  Bowel Sounds Assessment Active  GU Assessment  Genitourinary (WDL) X  Genitourinary Symptoms Oliguria  Psychosocial  Psychosocial (WDL) WDL

## 2022-06-19 NOTE — Progress Notes (Signed)
Per night RN pt's O2 was increased to 5L. This am pt's O2 is increased to 10L of HFNC due to O2 desat (low 80) Right now pt's O2 is 95% MD and respiratory therapist are aware.

## 2022-06-19 NOTE — Progress Notes (Signed)
Contacted [employer] and spoke to [employer]. Clinic advised pt may d/c this weekend if pt deemed medically stable. Clinic aware pt may resume on Monday. Will fax d/c summary to clinic on Monday morning for continuation of care if pt d/c this weekend.   Melven Sartorius Renal Navigator [phone]

## 2022-06-19 NOTE — Progress Notes (Signed)
Pt is being transported to HD. Report is given at the bedside to HD RN Clarene Critchley.

## 2022-06-19 NOTE — Plan of Care (Signed)
  Problem: Education: Goal: Ability to describe self-care measures that may prevent or decrease complications (Diabetes Survival Skills Education) will improve Outcome: Progressing   Problem: Activity: Goal: Risk for activity intolerance will decrease Outcome: Progressing

## 2022-06-19 NOTE — Progress Notes (Addendum)
   06/19/22 1308  Vitals  Temp 97.9 F (36.6 C)  Temp Source Oral  BP 126/61  MAP (mmHg) 78  BP Location Right Arm  BP Method Automatic  Patient Position (if appropriate) Lying  Pulse Rate 91  Pulse Rate Source Monitor  ECG Heart Rate 91  Resp (!) 21  During Treatment Monitoring  HD Safety Checks Performed Yes  Intra-Hemodialysis Comments Tx completed  Post Treatment  Dialyzer Clearance Heavily streaked  Duration of HD Treatment -hour(s) 3.5 hour(s)  Liters Processed 84  Fluid Removed (mL) 2400 mL  Tolerated HD Treatment Yes  AVG/AVF Arterial Site Held (minutes) 7 minutes  AVG/AVF Venous Site Held (minutes) 7 minutes  Fistula / Graft Left Upper arm Arteriovenous fistula  No placement date or time found.   Orientation: Left  Access Location: Upper arm  Access Type: Arteriovenous fistula  Site Condition No complications  Fistula / Graft Assessment Present;Thrill;Bruit  Status Deaccessed   Received patient in bed to unit.  Alert and oriented.  Informed consent signed and in chart.   Treatment initiated: 0913A Treatment completed: 1518D  Patient tolerated well.  Transported back to the room  Alert, without acute distress.  Hand-off given to patient's nurse.   Access used: Yes Access issues: None  Total UF removed: 2400 Medication(s) given: None Post HD VS: See Above  Post HD weight: 66.9 kg   [facility]

## 2022-06-19 NOTE — Progress Notes (Signed)
Physical Therapy Treatment Patient Details Name: Jeffrey [name] MRN: [id number] DOB: 07-16-1945 Today's Date: 06/19/2022   History of Present Illness Patient is a 77 year old male admitted 06/28/2022 due to SOB/acute respiratory distress found to be positive for flu. PMH: DM, HTN, GI bleed, chronic respiratory failure on 2 L nasal cannula, afib, ESRD on HD and cirrhosis requiring periodic paracentesis.    PT Comments    Patient seen following HD and nursing assisting with bed change.  He was confused and needing more help to mobilize to EOB.  Could not keep focus to attempt to eat sandwich in front of him distracted by SpO2 probe on his finger.  Wife present and speaking with MD in the room.  Hopeful pt can improve and be able to return home with her help.  PT will follow up.    Recommendations for follow up therapy are one component of a multi-disciplinary discharge planning process, led by the attending physician.  Recommendations may be updated based on patient status, additional functional criteria and insurance authorization.  Follow Up Recommendations  Home health PT     Assistance Recommended at Discharge Frequent or constant Supervision/Assistance  Patient can return home with the following A lot of help with bathing/dressing/bathroom;Assistance with cooking/housework;Assist for transportation;Direct supervision/assist for medications management;Help with stairs or ramp for entrance;A little help with walking and/or transfers;Direct supervision/assist for financial management   Equipment Recommendations  None recommended by PT    Recommendations for Other Services       Precautions / Restrictions Precautions Precautions: Fall Precaution Comments: watch HR & SpO2 Restrictions Weight Bearing Restrictions: No     Mobility  Bed Mobility Overal bed mobility: Needs Assistance Bed Mobility: Supine to Sit, Sit to Supine     Supine to sit: HOB elevated, Mod assist Sit to supine: Mod  assist   General bed mobility comments: assist for legs off bed and to lift trunk, to supine assist for legs and scooting up in bed    Transfers     Transfers: Bed to chair/wheelchair/BSC Sit to Stand: Mod assist           General transfer comment: up on feet to scoot toward [facility]    Ambulation/Gait               General Gait Details: unable   Stairs             Wheelchair Mobility    Modified Rankin (Stroke Patients Only)       Balance Overall balance assessment: Needs assistance   Sitting balance-Leahy Scale: Fair Sitting balance - Comments: fatigued quickly at EOB                                    Cognition Arousal/Alertness: Awake/alert Behavior During Therapy: Restless Overall Cognitive Status: Impaired/Different from baseline Area of Impairment: Safety/judgement, Problem solving, Attention, Following commands, Awareness, Memory                 Orientation Level: Disoriented to, Time, Situation Current Attention Level: Focused Memory: Decreased short-term memory Following Commands: Follows one step commands inconsistently Safety/Judgement: Decreased awareness of safety, Decreased awareness of deficits   Problem Solving: Slow processing, Decreased initiation, Difficulty sequencing, Requires tactile cues, Requires verbal cues General Comments: kept taking SpO2 probe off his finger after PT just replaced, asking his wife about a truck, followed commands to assist to side scoot toward [facility]  Exercises      General Comments General comments (skin integrity, edema, etc.): SpO2 >90% on 6L O2      Pertinent Vitals/Pain Pain Assessment Pain Assessment: Faces Faces Pain Scale: No hurt    Home Living                          Prior Function            PT Goals (current goals can now be found in the care plan section) Progress towards PT goals: Not progressing toward goals - comment     Frequency    Min 3X/week      PT Plan Current plan remains appropriate    Co-evaluation              AM-PAC PT "6 Clicks" Mobility   Outcome Measure  Help needed turning from your back to your side while in a flat bed without using bedrails?: A Lot Help needed moving from lying on your back to sitting on the side of a flat bed without using bedrails?: A Lot Help needed moving to and from a bed to a chair (including a wheelchair)?: A Lot Help needed standing up from a chair using your arms (e.g., wheelchair or bedside chair)?: Total Help needed to walk in hospital room?: Total Help needed climbing 3-5 steps with a railing? : Total 6 Click Score: 9    End of Session Equipment Utilized During Treatment: Oxygen Activity Tolerance: Patient limited by fatigue Patient left: in bed;with call bell/phone within reach;with bed alarm set;with family/visitor present   PT Visit Diagnosis: Other abnormalities of gait and mobility (R26.89);Muscle weakness (generalized) (M62.81);Other symptoms and signs involving the nervous system (R29.898);Unsteadiness on feet (R26.81)     Time: 7829-5621 PT Time Calculation (min) (ACUTE ONLY): 20 min  Charges:  $Therapeutic Activity: 8-22 mins                     Magda Kiel, PT Acute Rehabilitation Services Office:[phone] 06/19/2022    [name] 06/19/2022, 4:59 PM

## 2022-06-19 NOTE — Progress Notes (Signed)
Pt is back to the unit from HD. VS WDL; appears confused. Wife is at the bedside.

## 2022-06-19 NOTE — Progress Notes (Signed)
   06/19/22 1308  Vitals  Temp 97.9 F (36.6 C)  Temp Source Oral  BP 126/61  MAP (mmHg) 78  BP Location Right Arm  BP Method Automatic  Patient Position (if appropriate) Lying  Pulse Rate 91  Pulse Rate Source Monitor  ECG Heart Rate 91  Resp (!) 21  During Treatment Monitoring  HD Safety Checks Performed Yes  Intra-Hemodialysis Comments Tx completed  Post Treatment  Dialyzer Clearance Heavily streaked  Duration of HD Treatment -hour(s) 3.5 hour(s)  Liters Processed 84  Fluid Removed (mL) 2400 mL  Tolerated HD Treatment Yes  AVG/AVF Arterial Site Held (minutes) 7 minutes  AVG/AVF Venous Site Held (minutes) 7 minutes  Fistula / Graft Left Upper arm Arteriovenous fistula  No placement date or time found.   Orientation: Left  Access Location: Upper arm  Access Type: Arteriovenous fistula  Site Condition No complications  Fistula / Graft Assessment Present;Thrill;Bruit  Status Deaccessed

## 2022-06-19 NOTE — Progress Notes (Signed)
 [facility] Progress Note   Assessment/ Plan:   Dialysis Orders:  [facility] MWF 4:00 EDW 80.5kg  350/500 2K/2.5Ca AVF No heparin Mircera 200 (due 1/15)    Assessment/Plan: Dyspnea/respiratory distress -secondary to volume overload/Influenza A +--initially requiring BiPAP-now off. Still with increased O2 requirement. 2L [place] at baseline. Improved with HD on schedule ESRD -  HD MWF. Continue on schedule. Next HD 1/19 Hypertension/volume  - BP stable/ 3-4 UF  Anemia  - Hb 8.4. s/p transfusion on 1/11. ESA ordered Metabolic bone disease -  Follow Ca/Phos here. No VDRA as outpatient  Nutrition - Renal diet with fluid restriction.  Hx of cirrhosis with ascites - per primary team.  Discussed with pt today the importance of taking lactulose BID.  Had paracentesis with 2.3L off. Flu A +- tamiflu Dispo: pending  Subjective:    Seen on dialysis.  Seems more encephalopathic today,  MAR reviewed- only taking his lactulose once a day.     Objective:   BP 129/60   Pulse 93   Temp 97.7 F (36.5 C) (Oral)   Resp (!) 23   Wt 66.3 kg   SpO2 98%   BMI 22.89 kg/m   Physical Exam: Gen:NAD, wearing O2 HEENT: JVD improved CVS: RRR Resp: clear anteriorly Abd: + protuberant, fluid wave improved Ext:1+ LE edema ACCESS: L AVF  Labs: BMET Recent Labs  Lab 06/27/2022 1510 06/17/22 0425 06/19/22 0430  NA 130* 132* 133*  K 4.2 3.5 3.2*  CL 95* 92* 90*  CO2 '24 23 30  '$ GLUCOSE 227* 142* 167*  BUN 49* 37* 41*  CREATININE 3.50* 2.95* 3.30*  CALCIUM 8.3* 8.3* 8.7*  PHOS  --   --  5.2*   CBC Recent Labs  Lab 06/24/2022 1510 06/17/22 0425 06/19/22 0430  WBC 7.1 6.3 8.6  NEUTROABS  --   --  7.9*  HGB 7.8* 8.4* 8.5*  HCT 24.2* 26.6* 26.9*  MCV 102.1* 99.6 99.3  PLT 104* 105* 109*      Medications:     allopurinol  100 mg Oral Q M,W,F-2000   Chlorhexidine Gluconate Cloth  6 each Topical Q0600   darbepoetin (ARANESP) injection - DIALYSIS  200 mcg Subcutaneous Q Mon-1800    diltiazem  120 mg Oral Daily   heparin  5,000 Units Subcutaneous Q8H   insulin aspart  0-5 Units Subcutaneous QHS   insulin aspart  0-9 Units Subcutaneous TID WC   iron polysaccharides  150 mg Oral Daily   lactulose  10 g Oral BID   [START ON 06/20/2022] methylPREDNISolone (SOLU-MEDROL) injection  40 mg Intravenous BID   metoprolol succinate  25 mg Oral QHS   And   [START ON 06/20/2022] metoprolol succinate  25 mg Oral Once per day on Sun Tue Thu Sat   mirtazapine  30 mg Oral QHS   oseltamivir  30 mg Oral Q M,W,F,Sa-1800   pravastatin  20 mg Oral q AM   rifaximin  550 mg Oral BID   sulfamethoxazole-trimethoprim  1 tablet Oral Weekly   venlafaxine  75 mg Oral TID WC  Madelon Lips MD [facility] [phone] 06/19/2022,10:01 AM

## 2022-06-19 NOTE — Plan of Care (Signed)
  Problem: Coping: Goal: Ability to adjust to condition or change in health will improve Outcome: Progressing   Problem: Metabolic: Goal: Ability to maintain appropriate glucose levels will improve Outcome: Progressing   Problem: Nutritional: Goal: Maintenance of adequate nutrition will improve Outcome: Progressing   Problem: Safety: Goal: Ability to remain free from injury will improve Outcome: Progressing

## 2022-06-19 NOTE — Progress Notes (Signed)
Occupational Therapy Treatment Patient Details Name: Jeffrey [name] MRN: [id number] DOB: 1945-07-13 Today's Date: 06/19/2022   History of present illness Patient is a 77 year old male admitted 06/01/2022 due to SOB/acute respiratory distress found to be positive for flu. PMH: DM, HTN, GI bleed, chronic respiratory failure on 2 L nasal cannula, afib, ESRD on HD and cirrhosis requiring periodic paracentesis.   OT comments  Pt in bed upon therapy arrival with wife present during session. Wife reports that pt is much more confused today upon returning from HD. Session focused on activity tolerance while seated on EOB to select food for late breakfast. With increased time, pt was able to verbalize what he wanted when food was read to him. OT called to order food for patient due to increased confusion and HOH. Due to increased fatigue, pt required increased physical assist to scoot towards [facility] and return to supine. Pt left with nursing in room and wife at bedside.    Recommendations for follow up therapy are one component of a multi-disciplinary discharge planning process, led by the attending physician.  Recommendations may be updated based on patient status, additional functional criteria and insurance authorization.    Follow Up Recommendations  Home health OT     Assistance Recommended at Discharge PRN  Patient can return home with the following  A lot of help with walking and/or transfers;A lot of help with bathing/dressing/bathroom;Assistance with cooking/housework;Help with stairs or ramp for entrance;Assist for transportation   Equipment Recommendations  None recommended by OT       Precautions / Restrictions Precautions Precautions: Fall Precaution Comments: watch HR & SpO2 Restrictions Weight Bearing Restrictions: No       Mobility Bed Mobility Overal bed mobility: Needs Assistance Bed Mobility: Supine to Sit, Sit to Supine     Supine to sit: Min assist, HOB elevated Sit to  supine: Min assist   General bed mobility comments: assist provided for LE off and off the bed. Patient Response: Flat affect  Transfers Overall transfer level: Needs assistance   Transfers: Sit to/from Stand Sit to Stand: Max assist           General transfer comment: VC provided for hand placement and technique to scoot towards HOB. Pt unable to follow commands and small squat scoot performed towards HOB instead     Balance Overall balance assessment: Needs assistance Sitting-balance support: Feet supported, Bilateral upper extremity supported Sitting balance-Leahy Scale: Fair Sitting balance - Comments: Pt swayed back and forth during session while seated EOB although able to maintain sitting balance.           ADL either performed or assessed with clinical judgement      Cognition Arousal/Alertness: Lethargic Behavior During Therapy: Flat affect Overall Cognitive Status: Impaired/Different from baseline Area of Impairment: Safety/judgement, Problem solving, Attention, Following commands, Awareness, Memory             Memory: Decreased short-term memory Following Commands: Follows one step commands inconsistently Safety/Judgement: Decreased awareness of safety, Decreased awareness of deficits   Problem Solving: Slow processing, Decreased initiation, Difficulty sequencing, Requires tactile cues, Requires verbal cues                     Pertinent Vitals/ Pain       Pain Assessment Pain Assessment: Faces Faces Pain Scale: Hurts a little bit Pain Location: buttocks Pain Descriptors / Indicators: Guarding, Grimacing Pain Intervention(s): Limited activity within patient's tolerance, Monitored during session  Frequency  Min 2X/week        Progress Toward Goals  OT Goals(current goals can now be found in the care plan section)  Progress towards OT goals: Progressing toward goals     Plan Discharge plan remains appropriate;Frequency  remains appropriate       AM-PAC OT "6 Clicks" Daily Activity     Outcome Measure   Help from another person eating meals?: None Help from another person taking care of personal grooming?: None Help from another person toileting, which includes using toliet, bedpan, or urinal?: A Little Help from another person bathing (including washing, rinsing, drying)?: A Little Help from another person to put on and taking off regular upper body clothing?: None Help from another person to put on and taking off regular lower body clothing?: A Lot 6 Click Score: 20    End of Session    OT Visit Diagnosis: Muscle weakness (generalized) (M62.81);Unsteadiness on feet (R26.81)   Activity Tolerance Patient limited by fatigue   Patient Left in bed;with call bell/phone within reach;with family/visitor present;with nursing/sitter in room           Time: 1520-1540 OT Time Calculation (min): 20 min  Charges: OT General Charges $OT Visit: 1 Visit OT Treatments $Therapeutic Activity: 8-22 mins  Ailene Ravel, OTR/L,CBIS  Supplemental OT - [facility] and [facility] Secure Chat Preferred    Dorathea Faerber, Clarene Duke 06/19/2022, 4:29 PM

## 2022-06-20 DIAGNOSIS — N186 End stage renal disease: Secondary | ICD-10-CM | POA: Diagnosis not present

## 2022-06-20 DIAGNOSIS — K7682 Hepatic encephalopathy: Secondary | ICD-10-CM | POA: Diagnosis not present

## 2022-06-20 DIAGNOSIS — J101 Influenza due to other identified influenza virus with other respiratory manifestations: Secondary | ICD-10-CM | POA: Diagnosis not present

## 2022-06-20 DIAGNOSIS — J9621 Acute and chronic respiratory failure with hypoxia: Secondary | ICD-10-CM | POA: Diagnosis not present

## 2022-06-20 LAB — RENAL FUNCTION PANEL
Albumin: 1.8 g/dL — ABNORMAL LOW (ref 3.5–5.0)
Anion gap: 14 (ref 5–15)
BUN: 40 mg/dL — ABNORMAL HIGH (ref 8–23)
CO2: 25 mmol/L (ref 22–32)
Calcium: 8.4 mg/dL — ABNORMAL LOW (ref 8.9–10.3)
Chloride: 92 mmol/L — ABNORMAL LOW (ref 98–111)
Creatinine, Ser: 2.47 mg/dL — ABNORMAL HIGH (ref 0.61–1.24)
GFR, Estimated: 27 mL/min — ABNORMAL LOW (ref >60)
Glucose, Bld: 280 mg/dL — ABNORMAL HIGH (ref 70–99)
Phosphorus: 3.9 mg/dL (ref 2.5–4.6)
Potassium: 3.8 mmol/L (ref 3.5–5.1)
Sodium: 131 mmol/L — ABNORMAL LOW (ref 135–145)

## 2022-06-20 LAB — GLUCOSE, CAPILLARY
Glucose-Capillary: 275 mg/dL — ABNORMAL HIGH (ref 70–99)
Glucose-Capillary: 254 mg/dL — ABNORMAL HIGH (ref 70–99)
Glucose-Capillary: 211 mg/dL — ABNORMAL HIGH (ref 70–99)
Glucose-Capillary: 170 mg/dL — ABNORMAL HIGH (ref 70–99)

## 2022-06-20 LAB — MAGNESIUM: Magnesium: 1.8 mg/dL (ref 1.7–2.4)

## 2022-06-20 LAB — CBC WITH DIFFERENTIAL/PLATELET
Abs Immature Granulocytes: 0.02 10*3/uL (ref 0.00–0.07)
Basophils Absolute: 0 10*3/uL (ref 0.0–0.1)
Basophils Relative: 0 %
Eosinophils Absolute: 0 10*3/uL (ref 0.0–0.5)
Eosinophils Relative: 0 %
HCT: 26.4 % — ABNORMAL LOW (ref 39.0–52.0)
Hemoglobin: 8.2 g/dL — ABNORMAL LOW (ref 13.0–17.0)
Immature Granulocytes: 0 %
Lymphocytes Relative: 3 %
Lymphs Abs: 0.2 10*3/uL — ABNORMAL LOW (ref 0.7–4.0)
MCH: 31.5 pg (ref 26.0–34.0)
MCHC: 31.1 g/dL (ref 30.0–36.0)
MCV: 101.5 fL — ABNORMAL HIGH (ref 80.0–100.0)
Monocytes Absolute: 0.3 10*3/uL (ref 0.1–1.0)
Monocytes Relative: 5 %
Neutro Abs: 5 10*3/uL (ref 1.7–7.7)
Neutrophils Relative %: 92 %
Platelets: 105 10*3/uL — ABNORMAL LOW (ref 150–400)
RBC: 2.6 MIL/uL — ABNORMAL LOW (ref 4.22–5.81)
RDW: 19.7 % — ABNORMAL HIGH (ref 11.5–15.5)
WBC: 5.5 10*3/uL (ref 4.0–10.5)
nRBC: 0 % (ref 0.0–0.2)

## 2022-06-20 LAB — AMMONIA: Ammonia: 82 umol/L — ABNORMAL HIGH (ref 9–35)

## 2022-06-20 MED ORDER — ALPRAZOLAM 0.5 MG PO TABS: 0.5000 mg | ORAL_TABLET | Freq: Every day | ORAL | Status: DC | PRN

## 2022-06-20 MED ORDER — CHLORHEXIDINE GLUCONATE CLOTH 2 % EX PADS: 6.0000 | MEDICATED_PAD | Freq: Every day | CUTANEOUS | Status: DC

## 2022-06-20 MED ORDER — ALPRAZOLAM 0.5 MG PO TABS
0.5000 mg | ORAL_TABLET | ORAL | Status: DC
  Administered 2022-06-22: 0.5 mg via ORAL
  Filled 2022-06-20 (×2): qty 1

## 2022-06-20 MED ORDER — FUROSEMIDE 40 MG PO TABS: 80.0000 mg | ORAL_TABLET | Freq: Every morning | ORAL | Status: DC

## 2022-06-20 NOTE — Progress Notes (Signed)
Progress Note    Jeffrey [name]   ZOX:[id number]  DOB: November 12, 1945  DOA: 06/29/2022     5 PCP: Monico Blitz, MD  Initial CC: SOB  Hospital Course: Jeffrey [name] is a 77 yo male with PMH DMII, HTN, GI bleed, chronic respiratory failure on 2 L [place], atrial fibrillation, cirrhosis, ESRD on HD MWF who originally presented to [facility] on 06/14/2022 for scheduled paracentesis.  Due to having shortness of breath he was unable to lay flat.  Further workup revealed positive for influenza and he required BiPAP briefly.  He was transferred to [facility] for dialysis.  Interval History:  Still confused this morning but is following all commands easily. Took his morning lactulose dose.   Assessment and Plan: * Acute on chronic respiratory failure with hypoxia (HCC) - Baseline of 2 L nasal cannula.   -Initially required BiPAP which has been weaned to approximately 5 L - Continue weaning back to home settings as able  Influenza A - Flu swab positive for H1 - Continue Tamiflu -Continue droplet precautions -Steroids added 06/19/2022 due to worsening oxygen requirements  Acute hepatic encephalopathy ([place]) - Patient developed worsening confusion on 06/19/2022 - Has been refusing evening dose of lactulose.  Likely has developed hepatic encephalopathy - NH3 elevated as expected at 98 - will convert to lactulose enema if necessary - monitor mentation   ESRD on dialysis [facility]) - Appreciate nephrology assistance, continue dialysis per schedule - continue inpatient HD as per nephrology   Alcoholic cirrhosis of liver with ascites ([place]) Continue lactulose and rifaximin.  Currently no hepatic encephalopathy.   - last paracentesis 11/27 -Patient originally was planning to undergo paracentesis at [facility] -Paracentesis able to be performed on 06/18/2022, 2.3 L fluid removed; negative for SBP per PMN count; culture negative  Esophageal varices in alcoholic cirrhosis (HCC) Currently stable with no evidence of  acute bleeding.  Continue Inderal  Acute hyponatremia Secondary to hypervolemia.  Manage volumes with dialysis and Lasix  Controlled type 2 diabetes mellitus with chronic kidney disease on chronic dialysis, with long-term current use of insulin (HCC) Continue insulin and sliding scale.   Old records reviewed in assessment of this patient  Antimicrobials: Tamiflu 06/13/2022 >> 06/19/22  DVT prophylaxis:  heparin injection 5,000 Units Start: 06/16/22 0600   Code Status:   Code Status: Full Code  Mobility Assessment (last 72 hours)     Mobility Assessment     Row Name 06/20/22 1000 06/20/22 0400 06/20/22 0000 06/19/22 2045 06/19/22 1657   Does patient have an order for bedrest or is patient medically unstable -- Yes- Bedfast (Level 1) - Complete Yes- Bedfast (Level 1) - Complete Yes- Bedfast (Level 1) - Complete --   What is the highest level of mobility based on the progressive mobility assessment? Level 2 (Chairfast) - Balance while sitting on edge of bed and cannot stand Level 2 (Chairfast) - Balance while sitting on edge of bed and cannot stand Level 2 (Chairfast) - Balance while sitting on edge of bed and cannot stand Level 2 (Chairfast) - Balance while sitting on edge of bed and cannot stand Level 2 (Chairfast) - Balance while sitting on edge of bed and cannot stand   Is the above level different from baseline mobility prior to current illness? -- Yes - Recommend PT order Yes - Recommend PT order Yes - Recommend PT order --    Row Name 06/19/22 1547 06/18/22 2030 06/18/22 1657 06/18/22 1056 06/18/22 0900   Does patient have an  order for bedrest or is patient medically unstable No - Continue assessment No - Continue assessment -- -- No - Continue assessment   What is the highest level of mobility based on the progressive mobility assessment? Level 4 (Walks with assist in room) - Balance while marching in place and cannot step forward and back - Complete Level 4 (Walks with assist in  room) - Balance while marching in place and cannot step forward and back - Complete Level 5 (Walks with assist in room/hall) - Balance while stepping forward/back and can walk in room with assist - Complete Level 5 (Walks with assist in room/hall) - Balance while stepping forward/back and can walk in room with assist - Complete Level 4 (Walks with assist in room) - Balance while marching in place and cannot step forward and back - Complete   Is the above level different from baseline mobility prior to current illness? Yes - Recommend PT order Yes - Recommend PT order -- -- Yes - Recommend PT order    Jeffrey [name] Name 06/17/22 2200 06/17/22 1619         Does patient have an order for bedrest or is patient medically unstable No - Continue assessment --      What is the highest level of mobility based on the progressive mobility assessment? Level 4 (Walks with assist in room) - Balance while marching in place and cannot step forward and back - Complete Level 4 (Walks with assist in room) - Balance while marching in place and cannot step forward and back - Complete      Is the above level different from baseline mobility prior to current illness? Yes - Recommend PT order --               Barriers to discharge:  Disposition Plan: Home 1 to 2 days Status is: Inpatient  Objective: Blood pressure (!) 138/54, pulse 80, temperature 97.7 F (36.5 C), temperature source Oral, resp. rate 14, weight 66.3 kg, SpO2 98 %.  Examination:  Physical Exam Constitutional:      General: He is not in acute distress.    Comments: Pleasantly confused   HENT:     Head: Normocephalic and atraumatic.     Mouth/Throat:     Mouth: Mucous membranes are moist.  Eyes:     Extraocular Movements: Extraocular movements intact.  Cardiovascular:     Rate and Rhythm: Normal rate and regular rhythm.     Heart sounds: Normal heart sounds.  Pulmonary:     Effort: Pulmonary effort is normal. No respiratory distress.     Breath  sounds: No wheezing.     Comments: Coarse breath sounds bilaterally Abdominal:     General: Bowel sounds are normal. There is no distension.     Palpations: Abdomen is soft.     Tenderness: There is no abdominal tenderness.  Musculoskeletal:        General: Normal range of motion.     Cervical back: Normal range of motion and neck supple.  Skin:    General: Skin is warm and dry.  Neurological:     Mental Status: He is alert. He is disoriented.     Comments: Follows commands and moves all 4 extremities  Psychiatric:        Mood and Affect: Mood normal.        Behavior: Behavior normal.      Consultants:  Nephrology  Procedures:  06/18/22: Paracentesis   Data Reviewed: Results for orders placed  or performed during the hospital encounter of 06/05/2022 (from the past 24 hour(s))  Glucose, capillary     Status: Abnormal   Collection Time: 06/19/22  5:08 PM  Result Value Ref Range   Glucose-Capillary 172 (H) 70 - 99 mg/dL  Glucose, capillary     Status: Abnormal   Collection Time: 06/19/22  8:11 PM  Result Value Ref Range   Glucose-Capillary 236 (H) 70 - 99 mg/dL  Glucose, capillary     Status: Abnormal   Collection Time: 06/19/22 10:20 PM  Result Value Ref Range   Glucose-Capillary 305 (H) 70 - 99 mg/dL   Comment 1 Document in Chart   Renal function panel     Status: Abnormal   Collection Time: 06/20/22  3:44 AM  Result Value Ref Range   Sodium 131 (L) 135 - 145 mmol/L   Potassium 3.8 3.5 - 5.1 mmol/L   Chloride 92 (L) 98 - 111 mmol/L   CO2 25 22 - 32 mmol/L   Glucose, Bld 280 (H) 70 - 99 mg/dL   BUN 40 (H) 8 - 23 mg/dL   Creatinine, Ser 2.47 (H) 0.61 - 1.24 mg/dL   Calcium 8.4 (L) 8.9 - 10.3 mg/dL   Phosphorus 3.9 2.5 - 4.6 mg/dL   Albumin 1.8 (L) 3.5 - 5.0 g/dL   GFR, Estimated 27 (L) >60 mL/min   Anion gap 14 5 - 15  CBC with Differential/Platelet     Status: Abnormal   Collection Time: 06/20/22  3:44 AM  Result Value Ref Range   WBC 5.5 4.0 - 10.5 K/uL   RBC  2.60 (L) 4.22 - 5.81 MIL/uL   Hemoglobin 8.2 (L) 13.0 - 17.0 g/dL   HCT 26.4 (L) 39.0 - 52.0 %   MCV 101.5 (H) 80.0 - 100.0 fL   MCH 31.5 26.0 - 34.0 pg   MCHC 31.1 30.0 - 36.0 g/dL   RDW 19.7 (H) 11.5 - 15.5 %   Platelets 105 (L) 150 - 400 K/uL   nRBC 0.0 0.0 - 0.2 %   Neutrophils Relative % 92 %   Neutro Abs 5.0 1.7 - 7.7 K/uL   Lymphocytes Relative 3 %   Lymphs Abs 0.2 (L) 0.7 - 4.0 K/uL   Monocytes Relative 5 %   Monocytes Absolute 0.3 0.1 - 1.0 K/uL   Eosinophils Relative 0 %   Eosinophils Absolute 0.0 0.0 - 0.5 K/uL   Basophils Relative 0 %   Basophils Absolute 0.0 0.0 - 0.1 K/uL   Immature Granulocytes 0 %   Abs Immature Granulocytes 0.02 0.00 - 0.07 K/uL  Magnesium     Status: None   Collection Time: 06/20/22  3:44 AM  Result Value Ref Range   Magnesium 1.8 1.7 - 2.4 mg/dL  Ammonia     Status: Abnormal   Collection Time: 06/20/22  3:44 AM  Result Value Ref Range   Ammonia 82 (H) 9 - 35 umol/L  Glucose, capillary     Status: Abnormal   Collection Time: 06/20/22  8:36 AM  Result Value Ref Range   Glucose-Capillary 275 (H) 70 - 99 mg/dL  Glucose, capillary     Status: Abnormal   Collection Time: 06/20/22 11:59 AM  Result Value Ref Range   Glucose-Capillary 254 (H) 70 - 99 mg/dL    I have reviewed pertinent nursing notes, vitals, labs, and images as necessary. I have ordered labwork to follow up on as indicated.  I have reviewed the last notes from staff over past  24 hours. I have discussed patient's care plan and test results with nursing staff, CM/SW, and other staff as appropriate.  Time spent: Greater than 50% of the 55 minute visit was spent in counseling/coordination of care for the patient as laid out in the A&P.   LOS: 5 days   Dwyane Dee, MD [facility] 06/20/2022, 2:53 PM

## 2022-06-20 NOTE — Progress Notes (Signed)
Received patient in bed to dialysis unit.  Alert, confused, unable to follow commands, and c/o chest pain. On call MD notified, ordered not to initial the HD tx and send patient back to his room.  Floor Nurse notified as well

## 2022-06-20 NOTE — Progress Notes (Addendum)
[facility] Progress Note   Subjective:   Patient seen and examined at bedside.  No family present.  Ongoing confusion. O2 weaned down after HD yesterday.  Denies CP, SOB, abdominal pain and n/v/d.   Objective Vitals:   06/19/22 1539 06/19/22 1547 06/19/22 1800 06/19/22 2017  BP: (!) 129/57 (!) 141/52  (!) 126/56  Pulse:  95  (!) 103  Resp:  16  20  Temp:    98.1 F (36.7 C)  TempSrc:    Oral  SpO2:  100% 100% 100%  Weight:       Physical Exam General:chronically ill appearing, thin male in NAD Heart:RRR Lungs:mostly CTAB, nml WOBon 5L O2 Abdomen:soft, NTND Extremities:no LE edema Dialysis Access: LU AVF +b/t   Filed Weights   06/17/22 0814 06/17/22 1253 06/19/22 0913  Weight: 74.3 kg 70.2 kg 66.3 kg    Intake/Output Summary (Last 24 hours) at 06/20/2022 0806 Last data filed at 06/19/2022 2045 Gross per 24 hour  Intake 120 ml  Output 2450 ml  Net -2330 ml    Additional Objective Labs: Basic Metabolic Panel: Recent Labs  Lab 06/17/22 0425 06/19/22 0430 06/20/22 0344  NA 132* 133* 131*  K 3.5 3.2* 3.8  CL 92* 90* 92*  CO2 '23 30 25  '$ GLUCOSE 142* 167* 280*  BUN 37* 41* 40*  CREATININE 2.95* 3.30* 2.47*  CALCIUM 8.3* 8.7* 8.4*  PHOS  --  5.2* 3.9   Liver Function Tests: Recent Labs  Lab 06/17/22 0425 06/19/22 0430 06/20/22 0344  AST 35  --   --   ALT 26  --   --   ALKPHOS 208*  --   --   BILITOT 1.0  --   --   PROT 5.8*  --   --   ALBUMIN 1.9* 1.9* 1.8*   CBC: Recent Labs  Lab 06/27/2022 1510 06/17/22 0425 06/19/22 0430 06/20/22 0344  WBC 7.1 6.3 8.6 5.5  NEUTROABS  --   --  7.9* 5.0  HGB 7.8* 8.4* 8.5* 8.2*  HCT 24.2* 26.6* 26.9* 26.4*  MCV 102.1* 99.6 99.3 101.5*  PLT 104* 105* 109* 105*   Blood Culture    Component Value Date/Time   SDES ABDOMEN PERITONEAL 06/18/2022 1302   SPECREQUEST NONE 06/18/2022 1302   CULT  06/18/2022 1302    NO GROWTH < 24 HOURS Performed at [facility], [place] [address],  [place], [place] [address]    REPTSTATUS PENDING 06/18/2022 1302    CBG: Recent Labs  Lab 06/19/22 0812 06/19/22 1317 06/19/22 1708 06/19/22 2011 06/19/22 2220  GLUCAP 175* 102* 172* 236* 305*   Iron Studies: No results for input(s): "IRON", "TIBC", "TRANSFERRIN", "FERRITIN" in the last 72 hours. Lab Results  Component Value Date   INR 1.6 (H) 04/24/2022   INR 1.5 (H) 04/23/2022   INR 1.3 (H) 04/22/2022   Studies/Results: DG CHEST PORT 1 VIEW  Result Date: 06/19/2022 CLINICAL DATA:  Respiratory failure EXAM: PORTABLE CHEST 1 VIEW COMPARISON:  04/22/2022 FINDINGS: Patient rotated minimally right. Midline trachea. Mild cardiomegaly. Small left and trace right pleural fluid. Numerous leads and wires project over the chest. Favor skin fold over the lateral right hemithorax. Interstitial edema is moderate. Left-greater-than-right basilar airspace disease is persistent or recurrent. IMPRESSION: Congestive heart failure with left-greater-than-right pleural effusions. Bibasilar airspace disease which is most likely atelectasis. Especially at the left lung base, infection or aspiration cannot be excluded. Favor skin fold over the lateral right hemithorax. If there is a  high concern of right-sided pneumothorax, consider repeat radiographs. Electronically Signed   By: Abigail Miyamoto M.D.   On: 06/19/2022 08:36   IR Paracentesis  Result Date: 06/18/2022 INDICATION: Alcoholic cirrhosis, recurrent ascites EXAM: ULTRASOUND GUIDED LUQ PARACENTESIS MEDICATIONS: None. COMPLICATIONS: None immediate. PROCEDURE: Informed written consent was obtained from the patient after a discussion of the risks, benefits and alternatives to treatment. A timeout was performed prior to the initiation of the procedure. Initial ultrasound scanning demonstrates a moderate amount of ascites within the left upper abdominal quadrant. The left upper abdomen was prepped and draped in the usual sterile fashion. 1% lidocaine was used for  local anesthesia. Following this, a 19 gauge, 7-cm, Yueh catheter was introduced. An ultrasound image was saved for documentation purposes. The paracentesis was performed. The catheter was removed and a dressing was applied. The patient tolerated the procedure well without immediate post procedural complication. FINDINGS: A total of approximately 2.3L of hazy ascitic fluid was removed. Samples were sent to the laboratory as requested by the clinical team. IMPRESSION: Successful ultrasound-guided paracentesis yielding 2.3L liters of peritoneal fluid. PLAN: The patient has previously been evaluated by the [facility], and deemed not a candidate for intervention. Electronically Signed   By: Corrie Mckusick D.O.   On: 06/18/2022 13:50    Medications:   allopurinol  100 mg Oral Q M,W,F-2000   Chlorhexidine Gluconate Cloth  6 each Topical Q0600   darbepoetin (ARANESP) injection - DIALYSIS  200 mcg Subcutaneous Q Mon-1800   diltiazem  120 mg Oral Daily   heparin  5,000 Units Subcutaneous Q8H   insulin aspart  0-5 Units Subcutaneous QHS   insulin aspart  0-9 Units Subcutaneous TID WC   iron polysaccharides  150 mg Oral Daily   lactulose  10 g Oral BID   methylPREDNISolone (SOLU-MEDROL) injection  40 mg Intravenous BID   metoprolol succinate  25 mg Oral QHS   And   metoprolol succinate  25 mg Oral Once per day on Sun Tue Thu Sat   mirtazapine  30 mg Oral QHS   pravastatin  20 mg Oral q AM   rifaximin  550 mg Oral BID   sulfamethoxazole-trimethoprim  1 tablet Oral Weekly   venlafaxine  75 mg Oral TID WC    Dialysis Orders: [facility] MWF 4:00 EDW 80.5kg  350/500 2K/2.5Ca AVF No heparin Mircera 200 (due 1/15)    Assessment/Plan: Dyspnea/respiratory distress -secondary to volume overload/Influenza A +--initially requiring BiPAP-now off. Still with increased O2 requirement. 2L [place] at baseline. Improved with HD on schedule. CXR 1/19 with interstitial  edema, will order extra HD today. Weaning down O2 but remains on 5L.  ESRD -  HD MWF. Continue on schedule. Next HD 1/22 Hypertension/volume  - BP elevated, continue to have excess volume.  Net UF 2.4L yesterday.  Anemia  - Hb 8.2. s/p transfusion on 1/11. ESA ordered Metabolic bone disease -  Ca and phos in goal. No VDRA as outpatient  Nutrition - Renal diet with fluid restriction.  Hx of cirrhosis with ascites - per primary team.  Discussed with pt today the importance of taking lactulose BID.  Had paracentesis with 2.3L off yesterday. Flu A +- tamiflu Dispo: pending  Jen Mow, PA-C [facility] 06/20/2022,8:06 AM  LOS: 5 days

## 2022-06-20 NOTE — Progress Notes (Signed)
Occupational Therapy Treatment Patient Details Name: Jeffrey [name] MRN: [id number] DOB: 01-Aug-1945 Today's Date: 06/20/2022   History of present illness Patient is a 77 year old male admitted 06/22/2022 due to SOB/acute respiratory distress found to be positive for flu. PMH: DM, HTN, GI bleed, chronic respiratory failure on 2 L nasal cannula, afib, ESRD on HD and cirrhosis requiring periodic paracentesis.   OT comments  Patient received in supine and agreeable to OT session. Patient was mod assist to get to EOB and demonstrated poor sitting balance due to trunk flexion and requiring min to mod assist for balance. Patient was max assist for squat pivot transfer and demonstrated poor safety with patient attempting to lean forward while in chair, increasing fall risk. Patient appeared lethargic and demonstrated BP at 127/43 in recliner. Patient assisted back to bed due to unsafe to leave in chair. Nurse present during most of visit and is aware. Acute OT to continue to follow.    Recommendations for follow up therapy are one component of a multi-disciplinary discharge planning process, led by the attending physician.  Recommendations may be updated based on patient status, additional functional criteria and insurance authorization.    Follow Up Recommendations  Home health OT     Assistance Recommended at Discharge PRN  Patient can return home with the following  A lot of help with walking and/or transfers;A lot of help with bathing/dressing/bathroom;Assistance with cooking/housework;Help with stairs or ramp for entrance;Assist for transportation   Equipment Recommendations  None recommended by OT    Recommendations for Other Services      Precautions / Restrictions Precautions Precautions: Fall Precaution Comments: watch HR & SpO2 Restrictions Weight Bearing Restrictions: No       Mobility Bed Mobility Overal bed mobility: Needs Assistance Bed Mobility: Supine to Sit, Sit to Supine      Supine to sit: HOB elevated, Mod assist Sit to supine: Max assist   General bed mobility comments: mod assist to raise trunk and able to get legs off side of bed. required assistance with BLEs and positioning in bed to return to supine    Transfers Overall transfer level: Needs assistance Equipment used: None Transfers: Sit to/from Stand, Bed to chair/wheelchair/BSC Sit to Stand: Max assist   Squat pivot transfers: Max assist       General transfer comment: patient demonstrated unsafe seated posture on EOB and recliner and required max assist with squat pivot transfers     Balance Overall balance assessment: Needs assistance Sitting-balance support: Feet supported, Bilateral upper extremity supported Sitting balance-Leahy Scale: Poor Sitting balance - Comments: demonstrated trunk flexion and difficulty maintaining sitting balance       Standing balance comment: unable to come to complete stand during transfer                           ADL either performed or assessed with clinical judgement   ADL Overall ADL's : Needs assistance/impaired                                       General ADL Comments: focused on bed mobility and transfers    Extremity/Trunk Assessment              Vision       Perception     Praxis      Cognition Arousal/Alertness: Lethargic Behavior During Therapy: Impulsive Overall  Cognitive Status: Impaired/Different from baseline Area of Impairment: Safety/judgement, Problem solving, Attention, Following commands, Awareness, Memory                 Orientation Level: Disoriented to, Place, Time, Situation   Memory: Decreased short-term memory   Safety/Judgement: Decreased awareness of safety, Decreased awareness of deficits   Problem Solving: Slow processing, Decreased initiation, Difficulty sequencing, Requires tactile cues, Requires verbal cues General Comments: increased confusion and decreased  orientation. Patient with difficulty following commands. Lethargic with impulsive behavior, attempting to lean forward from chair        Exercises      Shoulder Instructions       General Comments HR 81, SpO2 100 on 5 liters BP 127/43 up in recliner and 135/49 in supine    Pertinent Vitals/ Pain       Pain Assessment Pain Assessment: Faces Faces Pain Scale: No hurt Pain Intervention(s): Monitored during session  Home Living                                          Prior Functioning/Environment              Frequency  Min 2X/week        Progress Toward Goals  OT Goals(current goals can now be found in the care plan section)  Progress towards OT goals: Not progressing toward goals - comment (limted due to lethargic and difficutly following directions)  Acute Rehab OT Goals Patient Stated Goal: none stated OT Goal Formulation: With patient Time For Goal Achievement: 07/02/22 Potential to Achieve Goals: Good ADL Goals Pt Will Perform Lower Body Bathing: with min assist;sitting/lateral leans;sit to/from stand Pt Will Perform Lower Body Dressing: with min assist;sitting/lateral leans;sit to/from stand Pt Will Transfer to Toilet: with supervision;ambulating;regular height toilet Pt Will Perform Toileting - Clothing Manipulation and hygiene: with min assist;sitting/lateral leans;sit to/from stand Pt/caregiver will Perform Home Exercise Program: Increased strength;Both right and left upper extremity;With Supervision;With written HEP provided  Plan Discharge plan remains appropriate;Frequency remains appropriate    Co-evaluation                 AM-PAC OT "6 Clicks" Daily Activity     Outcome Measure   Help from another person eating meals?: A Lot Help from another person taking care of personal grooming?: A Lot Help from another person toileting, which includes using toliet, bedpan, or urinal?: A Lot Help from another person bathing  (including washing, rinsing, drying)?: A Lot Help from another person to put on and taking off regular upper body clothing?: A Lot Help from another person to put on and taking off regular lower body clothing?: A Lot 6 Click Score: 12    End of Session Equipment Utilized During Treatment: Gait belt;Oxygen (5 liters)  OT Visit Diagnosis: Muscle weakness (generalized) (M62.81);Unsteadiness on feet (R26.81)   Activity Tolerance Patient limited by lethargy   Patient Left in bed;with call bell/phone within reach;with bed alarm set;with nursing/sitter in room   Nurse Communication Other (comment);Mobility status (cognition change)        Time: 1517-6160 OT Time Calculation (min): 23 min  Charges: OT General Charges $OT Visit: 1 Visit OT Treatments $Therapeutic Activity: 23-37 mins  Lodema Hong, [place]  Office [place] 06/20/2022, 11:08 AM

## 2022-06-21 DIAGNOSIS — K7682 Hepatic encephalopathy: Secondary | ICD-10-CM | POA: Diagnosis not present

## 2022-06-21 DIAGNOSIS — J9621 Acute and chronic respiratory failure with hypoxia: Secondary | ICD-10-CM | POA: Diagnosis not present

## 2022-06-21 DIAGNOSIS — N186 End stage renal disease: Secondary | ICD-10-CM | POA: Diagnosis not present

## 2022-06-21 DIAGNOSIS — J101 Influenza due to other identified influenza virus with other respiratory manifestations: Secondary | ICD-10-CM | POA: Diagnosis not present

## 2022-06-21 LAB — GLUCOSE, CAPILLARY
Glucose-Capillary: 183 mg/dL — ABNORMAL HIGH (ref 70–99)
Glucose-Capillary: 270 mg/dL — ABNORMAL HIGH (ref 70–99)
Glucose-Capillary: 325 mg/dL — ABNORMAL HIGH (ref 70–99)
Glucose-Capillary: 286 mg/dL — ABNORMAL HIGH (ref 70–99)
Glucose-Capillary: 210 mg/dL — ABNORMAL HIGH (ref 70–99)

## 2022-06-21 LAB — RENAL FUNCTION PANEL
Albumin: 2 g/dL — ABNORMAL LOW (ref 3.5–5.0)
Anion gap: 13 (ref 5–15)
BUN: 73 mg/dL — ABNORMAL HIGH (ref 8–23)
CO2: 26 mmol/L (ref 22–32)
Calcium: 8.9 mg/dL (ref 8.9–10.3)
Chloride: 92 mmol/L — ABNORMAL LOW (ref 98–111)
Creatinine, Ser: 3.41 mg/dL — ABNORMAL HIGH (ref 0.61–1.24)
GFR, Estimated: 18 mL/min — ABNORMAL LOW (ref >60)
Glucose, Bld: 239 mg/dL — ABNORMAL HIGH (ref 70–99)
Phosphorus: 4.4 mg/dL (ref 2.5–4.6)
Potassium: 4.2 mmol/L (ref 3.5–5.1)
Sodium: 131 mmol/L — ABNORMAL LOW (ref 135–145)

## 2022-06-21 LAB — CBC WITH DIFFERENTIAL/PLATELET
Abs Immature Granulocytes: 0.06 10*3/uL (ref 0.00–0.07)
Basophils Absolute: 0 10*3/uL (ref 0.0–0.1)
Basophils Relative: 0 %
Eosinophils Absolute: 0 10*3/uL (ref 0.0–0.5)
Eosinophils Relative: 0 %
HCT: 25.4 % — ABNORMAL LOW (ref 39.0–52.0)
Hemoglobin: 8.2 g/dL — ABNORMAL LOW (ref 13.0–17.0)
Immature Granulocytes: 1 %
Lymphocytes Relative: 2 %
Lymphs Abs: 0.1 10*3/uL — ABNORMAL LOW (ref 0.7–4.0)
MCH: 32.3 pg (ref 26.0–34.0)
MCHC: 32.3 g/dL (ref 30.0–36.0)
MCV: 100 fL (ref 80.0–100.0)
Monocytes Absolute: 0.1 10*3/uL (ref 0.1–1.0)
Monocytes Relative: 1 %
Neutro Abs: 7.5 10*3/uL (ref 1.7–7.7)
Neutrophils Relative %: 96 %
Platelets: 102 10*3/uL — ABNORMAL LOW (ref 150–400)
RBC: 2.54 MIL/uL — ABNORMAL LOW (ref 4.22–5.81)
RDW: 18.6 % — ABNORMAL HIGH (ref 11.5–15.5)
WBC: 7.8 10*3/uL (ref 4.0–10.5)
nRBC: 0.4 % — ABNORMAL HIGH (ref 0.0–0.2)

## 2022-06-21 LAB — MAGNESIUM: Magnesium: 2 mg/dL (ref 1.7–2.4)

## 2022-06-21 MED ORDER — LACTULOSE 10 GM/15ML PO SOLN
20.0000 g | Freq: Two times a day (BID) | ORAL | Status: DC
  Administered 2022-06-21 – 2022-06-22 (×3): 20 g via ORAL
  Filled 2022-06-21 (×3): qty 30

## 2022-06-21 NOTE — Progress Notes (Signed)
Progress Note    Jeffrey [name]   ZOX:[id number]  DOB: Oct 25, 1945  DOA: 06/04/2022     6 PCP: Monico Blitz, MD  Initial CC: SOB  Hospital Course: [name] is a 77 yo male with PMH DMII, HTN, GI bleed, chronic respiratory failure on 2 L [place], atrial fibrillation, cirrhosis, ESRD on HD MWF who originally presented to [facility] on 06/14/2022 for scheduled paracentesis.  Due to having shortness of breath he was unable to lay flat.  Further workup revealed positive for influenza and he required BiPAP briefly.  He was transferred to [facility] for dialysis.  Interval History:  Confusion slightly improved this morning.  Has been taking lactulose twice daily after discussions.  Breakfast in front of him but he had not eaten yet; appears food may not be adequate texture given he has no teeth.  Diet being changed to dysphagia to see if helps him eat better.  Assessment and Plan: * Acute on chronic respiratory failure with hypoxia (HCC) - Baseline of 2 L nasal cannula.   -Initially required BiPAP which has been weaned to approximately 5 L - Continue weaning back to home settings as able  Influenza A - Flu swab positive for H1 -Tamiflu course completed -Continue droplet precautions -Steroids added 06/19/2022 due to worsening oxygen requirements  Acute hepatic encephalopathy ([place]) - Patient developed worsening confusion on 06/19/2022 - Has been refusing evening dose of lactulose.  Likely has developed hepatic encephalopathy - NH3 elevated as expected at 25 - will convert to lactulose enema if necessary - monitor mentation  -Increase lactulose dose 06/21/2022  ESRD on dialysis [facility]) - Appreciate nephrology assistance, continue dialysis per schedule - continue inpatient HD as per nephrology   Alcoholic cirrhosis of liver with ascites ([place]) Continue lactulose and rifaximin - last paracentesis 11/27 -Patient originally was planning to undergo paracentesis at [facility] -Paracentesis able to be  performed on 06/18/2022, 2.3 L fluid removed; negative for SBP per PMN count; culture negative  Esophageal varices in alcoholic cirrhosis (HCC) Currently stable with no evidence of acute bleeding.  Continue Inderal  Acute hyponatremia Secondary to hypervolemia.  Manage volumes with dialysis and Lasix  Controlled type 2 diabetes mellitus with chronic kidney disease on chronic dialysis, with long-term current use of insulin (HCC) Continue insulin and sliding scale.   Old records reviewed in assessment of this patient  Antimicrobials: Tamiflu 06/01/2022 >> 06/19/22  DVT prophylaxis:  heparin injection 5,000 Units Start: 06/16/22 0600   Code Status:   Code Status: Full Code  Mobility Assessment (last 72 hours)     Mobility Assessment     Row Name 06/21/22 0900 06/20/22 1000 06/20/22 0900 06/20/22 0400 06/20/22 0000   Does patient have an order for bedrest or is patient medically unstable Yes- Bedfast (Level 1) - Complete -- Yes- Bedfast (Level 1) - Complete Yes- Bedfast (Level 1) - Complete Yes- Bedfast (Level 1) - Complete   What is the highest level of mobility based on the progressive mobility assessment? Level 2 (Chairfast) - Balance while sitting on edge of bed and cannot stand Level 2 (Chairfast) - Balance while sitting on edge of bed and cannot stand Level 2 (Chairfast) - Balance while sitting on edge of bed and cannot stand Level 2 (Chairfast) - Balance while sitting on edge of bed and cannot stand Level 2 (Chairfast) - Balance while sitting on edge of bed and cannot stand   Is the above level different from baseline mobility prior to current illness? Yes -  Recommend PT order -- Yes - Recommend PT order Yes - Recommend PT order Yes - Recommend PT order    Row Name 06/19/22 2045 06/19/22 1657 06/19/22 1547 06/18/22 2030 06/18/22 1657   Does patient have an order for bedrest or is patient medically unstable Yes- Bedfast (Level 1) - Complete -- No - Continue assessment No - Continue  assessment --   What is the highest level of mobility based on the progressive mobility assessment? Level 2 (Chairfast) - Balance while sitting on edge of bed and cannot stand Level 2 (Chairfast) - Balance while sitting on edge of bed and cannot stand Level 4 (Walks with assist in room) - Balance while marching in place and cannot step forward and back - Complete Level 4 (Walks with assist in room) - Balance while marching in place and cannot step forward and back - Complete Level 5 (Walks with assist in room/hall) - Balance while stepping forward/back and can walk in room with assist - Complete   Is the above level different from baseline mobility prior to current illness? Yes - Recommend PT order -- Yes - Recommend PT order Yes - Recommend PT order --            Barriers to discharge:  Disposition Plan: Home 1 to 2 days Status is: Inpatient  Objective: Blood pressure (!) 118/40, pulse 95, temperature 98.1 F (36.7 C), temperature source Oral, resp. rate 18, weight 66.3 kg, SpO2 100 %.  Examination:  Physical Exam Constitutional:      General: He is not in acute distress.    Comments: Mentation a little better this morning.  Not as confused.  Following commands easily and is awake, alert.  HENT:     Head: Normocephalic and atraumatic.     Mouth/Throat:     Mouth: Mucous membranes are moist.  Eyes:     Extraocular Movements: Extraocular movements intact.  Cardiovascular:     Rate and Rhythm: Normal rate and regular rhythm.     Heart sounds: Normal heart sounds.  Pulmonary:     Effort: Pulmonary effort is normal. No respiratory distress.     Breath sounds: No wheezing.     Comments: Coarse breath sounds bilaterally Abdominal:     General: Bowel sounds are normal. There is no distension.     Palpations: Abdomen is soft.     Tenderness: There is no abdominal tenderness.  Musculoskeletal:        General: Normal range of motion.     Cervical back: Normal range of motion and neck  supple.  Skin:    General: Skin is warm and dry.  Neurological:     Mental Status: He is alert. He is disoriented.     Comments: Follows commands and moves all 4 extremities  Psychiatric:        Mood and Affect: Mood normal.        Behavior: Behavior normal.      Consultants:  Nephrology  Procedures:  06/18/22: Paracentesis   Data Reviewed: Results for orders placed or performed during the hospital encounter of 06/26/2022 (from the past 24 hour(s))  Glucose, capillary     Status: Abnormal   Collection Time: 06/20/22 11:59 AM  Result Value Ref Range   Glucose-Capillary 254 (H) 70 - 99 mg/dL  Glucose, capillary     Status: Abnormal   Collection Time: 06/20/22  4:22 PM  Result Value Ref Range   Glucose-Capillary 211 (H) 70 - 99 mg/dL  Glucose, capillary  Status: Abnormal   Collection Time: 06/20/22  7:47 PM  Result Value Ref Range   Glucose-Capillary 170 (H) 70 - 99 mg/dL  Glucose, capillary     Status: Abnormal   Collection Time: 06/20/22 10:37 PM  Result Value Ref Range   Glucose-Capillary 183 (H) 70 - 99 mg/dL  Renal function panel     Status: Abnormal   Collection Time: 06/21/22  4:03 AM  Result Value Ref Range   Sodium 131 (L) 135 - 145 mmol/L   Potassium 4.2 3.5 - 5.1 mmol/L   Chloride 92 (L) 98 - 111 mmol/L   CO2 26 22 - 32 mmol/L   Glucose, Bld 239 (H) 70 - 99 mg/dL   BUN 73 (H) 8 - 23 mg/dL   Creatinine, Ser 3.41 (H) 0.61 - 1.24 mg/dL   Calcium 8.9 8.9 - 10.3 mg/dL   Phosphorus 4.4 2.5 - 4.6 mg/dL   Albumin 2.0 (L) 3.5 - 5.0 g/dL   GFR, Estimated 18 (L) >60 mL/min   Anion gap 13 5 - 15  CBC with Differential/Platelet     Status: Abnormal   Collection Time: 06/21/22  4:03 AM  Result Value Ref Range   WBC 7.8 4.0 - 10.5 K/uL   RBC 2.54 (L) 4.22 - 5.81 MIL/uL   Hemoglobin 8.2 (L) 13.0 - 17.0 g/dL   HCT 25.4 (L) 39.0 - 52.0 %   MCV 100.0 80.0 - 100.0 fL   MCH 32.3 26.0 - 34.0 pg   MCHC 32.3 30.0 - 36.0 g/dL   RDW 18.6 (H) 11.5 - 15.5 %   Platelets 102  (L) 150 - 400 K/uL   nRBC 0.4 (H) 0.0 - 0.2 %   Neutrophils Relative % 96 %   Neutro Abs 7.5 1.7 - 7.7 K/uL   Lymphocytes Relative 2 %   Lymphs Abs 0.1 (L) 0.7 - 4.0 K/uL   Monocytes Relative 1 %   Monocytes Absolute 0.1 0.1 - 1.0 K/uL   Eosinophils Relative 0 %   Eosinophils Absolute 0.0 0.0 - 0.5 K/uL   Basophils Relative 0 %   Basophils Absolute 0.0 0.0 - 0.1 K/uL   Immature Granulocytes 1 %   Abs Immature Granulocytes 0.06 0.00 - 0.07 K/uL  Magnesium     Status: None   Collection Time: 06/21/22  4:03 AM  Result Value Ref Range   Magnesium 2.0 1.7 - 2.4 mg/dL  Glucose, capillary     Status: Abnormal   Collection Time: 06/21/22  8:06 AM  Result Value Ref Range   Glucose-Capillary 270 (H) 70 - 99 mg/dL    I have reviewed pertinent nursing notes, vitals, labs, and images as necessary. I have ordered labwork to follow up on as indicated.  I have reviewed the last notes from staff over past 24 hours. I have discussed patient's care plan and test results with nursing staff, CM/SW, and other staff as appropriate.  Time spent: Greater than 50% of the 55 minute visit was spent in counseling/coordination of care for the patient as laid out in the A&P.   LOS: 6 days   Dwyane Dee, MD [facility] 06/21/2022, 11:47 AM

## 2022-06-21 NOTE — Plan of Care (Signed)
  Problem: Fluid Volume: Goal: Ability to maintain a balanced intake and output will improve Outcome: Not Progressing   Problem: Metabolic: Goal: Ability to maintain appropriate glucose levels will improve Outcome: Not Progressing   Problem: Nutritional: Goal: Progress toward achieving an optimal weight will improve Outcome: Not Progressing   Problem: Nutrition: Goal: Adequate nutrition will be maintained Outcome: Not Progressing

## 2022-06-21 NOTE — Progress Notes (Signed)
[facility] Progress Note   Subjective:   Patient seen and examined in room.  Sitting up in bed eating breakfast.  More alert today, able to answer some questions.  HD cancelled yesterday d/t CP, patient does not recall this.  Denies current CP, SOB and abdominal pain.  Admits to intermittent nausea.   Objective Vitals:   06/20/22 1518 06/20/22 2042 06/21/22 0455 06/21/22 0919  BP: (!) 149/58 (!) 144/55  (!) 118/40  Pulse:  79  95  Resp:  18    Temp:  98.5 F (36.9 C) 98.1 F (36.7 C)   TempSrc:  Oral Oral   SpO2:  100%    Weight:       Physical Exam General:chronically ill appearing male in NAD Heart:RRR  Lungs:BS decreased, nml WOB on 5L O2 Abdomen:soft, NT Extremities:no LE edema Dialysis Access: LU AVF +b/t   Filed Weights   06/17/22 0814 06/17/22 1253 06/19/22 0913  Weight: 74.3 kg 70.2 kg 66.3 kg    Intake/Output Summary (Last 24 hours) at 06/21/2022 0940 Last data filed at 06/20/2022 2302 Gross per 24 hour  Intake --  Output 0 ml  Net 0 ml    Additional Objective Labs: Basic Metabolic Panel: Recent Labs  Lab 06/19/22 0430 06/20/22 0344 06/21/22 0403  NA 133* 131* 131*  K 3.2* 3.8 4.2  CL 90* 92* 92*  CO2 '30 25 26  '$ GLUCOSE 167* 280* 239*  BUN 41* 40* 73*  CREATININE 3.30* 2.47* 3.41*  CALCIUM 8.7* 8.4* 8.9  PHOS 5.2* 3.9 4.4   Liver Function Tests: Recent Labs  Lab 06/17/22 0425 06/19/22 0430 06/20/22 0344 06/21/22 0403  AST 35  --   --   --   ALT 26  --   --   --   ALKPHOS 208*  --   --   --   BILITOT 1.0  --   --   --   PROT 5.8*  --   --   --   ALBUMIN 1.9* 1.9* 1.8* 2.0*   CBC: Recent Labs  Lab 06/06/2022 1510 06/17/22 0425 06/19/22 0430 06/20/22 0344 06/21/22 0403  WBC 7.1 6.3 8.6 5.5 7.8  NEUTROABS  --   --  7.9* 5.0 7.5  HGB 7.8* 8.4* 8.5* 8.2* 8.2*  HCT 24.2* 26.6* 26.9* 26.4* 25.4*  MCV 102.1* 99.6 99.3 101.5* 100.0  PLT 104* 105* 109* 105* 102*   Blood Culture    Component Value Date/Time   SDES  ABDOMEN PERITONEAL 06/18/2022 1302   SPECREQUEST NONE 06/18/2022 1302   CULT  06/18/2022 1302    NO GROWTH 3 DAYS Performed at [facility], [place] [address] [place], [place] [address]    REPTSTATUS PENDING 06/18/2022 1302    CBG: Recent Labs  Lab 06/20/22 1159 06/20/22 1622 06/20/22 1947 06/20/22 2237 06/21/22 0806  GLUCAP 254* 211* 170* 183* 270*    Medications:   allopurinol  100 mg Oral Q M,W,F-2000   [START ON 06/22/2022] ALPRAZolam  0.5 mg Oral Q M,W,F   Chlorhexidine Gluconate Cloth  6 each Topical Q0600   darbepoetin (ARANESP) injection - DIALYSIS  200 mcg Subcutaneous Q Mon-1800   diltiazem  120 mg Oral Daily   heparin  5,000 Units Subcutaneous Q8H   insulin aspart  0-5 Units Subcutaneous QHS   insulin aspart  0-9 Units Subcutaneous TID WC   iron polysaccharides  150 mg Oral Daily   lactulose  20 g Oral BID   methylPREDNISolone (SOLU-MEDROL) injection  40 mg Intravenous BID   metoprolol succinate  25 mg Oral QHS   And   metoprolol succinate  25 mg Oral Once per day on Sun Tue Thu Sat   mirtazapine  30 mg Oral QHS   pravastatin  20 mg Oral q AM   rifaximin  550 mg Oral BID   sulfamethoxazole-trimethoprim  1 tablet Oral Weekly   venlafaxine  75 mg Oral TID WC    Dialysis Orders: [facility] MWF 4:00 EDW 80.5kg  350/500 2K/2.5Ca AVF No heparin Mircera 200 (due 1/15)    Assessment/Plan: Dyspnea/respiratory distress -secondary to volume overload/Influenza A +--initially requiring BiPAP-now off. Still with increased O2 requirement. 2L [place] at baseline. Improved with HD on schedule. Weaning down O2 but remains on 5L. CXR 1/19 with interstitial edema, unable to complete extra HD yesterday d/t CP which has now resolved.  Encephalopathy - improved today.  Likely 2/2 refusing lactulose.  ESRD -  HD MWF. Continue on schedule. Next HD 1/22 1st shift Hypertension/volume  - BP variable. Net UF 2.4L yesterday. Continue UF as tolerated.  Anemia  - Hb 8.2. s/p transfusion  on 1/11. ESA ordered Metabolic bone disease -  Ca and phos in goal. No VDRA as outpatient  Nutrition - Renal diet with fluid restriction.  Hx of cirrhosis with ascites - per primary team.  Discussed with pt today the importance of taking lactulose BID.  Had paracentesis with 2.3L off 1/19.   Flu A +- tamiflu Dispo: pending  Jen Mow, PA-C [facility] 06/21/2022,9:40 AM  LOS: 6 days

## 2022-06-22 ENCOUNTER — Inpatient Hospital Stay (HOSPITAL_COMMUNITY): Payer: Medicare Other

## 2022-06-22 DIAGNOSIS — J9621 Acute and chronic respiratory failure with hypoxia: Secondary | ICD-10-CM | POA: Diagnosis not present

## 2022-06-22 DIAGNOSIS — K7682 Hepatic encephalopathy: Secondary | ICD-10-CM | POA: Diagnosis not present

## 2022-06-22 DIAGNOSIS — J101 Influenza due to other identified influenza virus with other respiratory manifestations: Secondary | ICD-10-CM | POA: Diagnosis not present

## 2022-06-22 DIAGNOSIS — K7031 Alcoholic cirrhosis of liver with ascites: Secondary | ICD-10-CM | POA: Diagnosis not present

## 2022-06-22 DIAGNOSIS — N186 End stage renal disease: Secondary | ICD-10-CM | POA: Diagnosis not present

## 2022-06-22 LAB — BLOOD GAS, ARTERIAL
Acid-Base Excess: 0.5 mmol/L (ref 0.0–2.0)
Acid-base deficit: 5.6 mmol/L — ABNORMAL HIGH (ref 0.0–2.0)
Bicarbonate: 24.7 mmol/L (ref 20.0–28.0)
Bicarbonate: 28.7 mmol/L — ABNORMAL HIGH (ref 20.0–28.0)
Drawn by: 427831
Drawn by: 418751
O2 Saturation: UNDETERMINED %
O2 Saturation: UNDETERMINED %
Patient temperature: 36.4
Patient temperature: 37
pCO2 arterial: 69 mmHg (ref 32–48)
pCO2 arterial: 61 mmHg — ABNORMAL HIGH (ref 32–48)
pH, Arterial: 7.16 — CL (ref 7.35–7.45)
pH, Arterial: 7.28 — ABNORMAL LOW (ref 7.35–7.45)
pO2, Arterial: 137 mmHg — ABNORMAL HIGH (ref 83–108)
pO2, Arterial: 282 mmHg — ABNORMAL HIGH (ref 83–108)

## 2022-06-22 LAB — RENAL FUNCTION PANEL
Albumin: 2 g/dL — ABNORMAL LOW (ref 3.5–5.0)
Albumin: 2.2 g/dL — ABNORMAL LOW (ref 3.5–5.0)
Anion gap: 14 (ref 5–15)
Anion gap: 14 (ref 5–15)
BUN: 103 mg/dL — ABNORMAL HIGH (ref 8–23)
BUN: 99 mg/dL — ABNORMAL HIGH (ref 8–23)
CO2: 25 mmol/L (ref 22–32)
CO2: 26 mmol/L (ref 22–32)
Calcium: 8.9 mg/dL (ref 8.9–10.3)
Calcium: 9 mg/dL (ref 8.9–10.3)
Chloride: 92 mmol/L — ABNORMAL LOW (ref 98–111)
Chloride: 92 mmol/L — ABNORMAL LOW (ref 98–111)
Creatinine, Ser: 4.13 mg/dL — ABNORMAL HIGH (ref 0.61–1.24)
Creatinine, Ser: 4.06 mg/dL — ABNORMAL HIGH (ref 0.61–1.24)
GFR, Estimated: 14 mL/min — ABNORMAL LOW (ref >60)
GFR, Estimated: 15 mL/min — ABNORMAL LOW (ref >60)
Glucose, Bld: 214 mg/dL — ABNORMAL HIGH (ref 70–99)
Glucose, Bld: 193 mg/dL — ABNORMAL HIGH (ref 70–99)
Phosphorus: 4.2 mg/dL (ref 2.5–4.6)
Phosphorus: 4.4 mg/dL (ref 2.5–4.6)
Potassium: 4.3 mmol/L (ref 3.5–5.1)
Potassium: 3.9 mmol/L (ref 3.5–5.1)
Sodium: 131 mmol/L — ABNORMAL LOW (ref 135–145)
Sodium: 132 mmol/L — ABNORMAL LOW (ref 135–145)

## 2022-06-22 LAB — CBC
HCT: 25.6 % — ABNORMAL LOW (ref 39.0–52.0)
Hemoglobin: 8.1 g/dL — ABNORMAL LOW (ref 13.0–17.0)
MCH: 31.2 pg (ref 26.0–34.0)
MCHC: 31.6 g/dL (ref 30.0–36.0)
MCV: 98.5 fL (ref 80.0–100.0)
Platelets: 124 10*3/uL — ABNORMAL LOW (ref 150–400)
RBC: 2.6 MIL/uL — ABNORMAL LOW (ref 4.22–5.81)
RDW: 18.8 % — ABNORMAL HIGH (ref 11.5–15.5)
WBC: 14.5 10*3/uL — ABNORMAL HIGH (ref 4.0–10.5)
nRBC: 0.4 % — ABNORMAL HIGH (ref 0.0–0.2)

## 2022-06-22 LAB — CBC WITH DIFFERENTIAL/PLATELET
Abs Immature Granulocytes: 0.03 10*3/uL (ref 0.00–0.07)
Basophils Absolute: 0 10*3/uL (ref 0.0–0.1)
Basophils Relative: 0 %
Eosinophils Absolute: 0 10*3/uL (ref 0.0–0.5)
Eosinophils Relative: 0 %
HCT: 23.8 % — ABNORMAL LOW (ref 39.0–52.0)
Hemoglobin: 7.4 g/dL — ABNORMAL LOW (ref 13.0–17.0)
Immature Granulocytes: 1 %
Lymphocytes Relative: 2 %
Lymphs Abs: 0.1 10*3/uL — ABNORMAL LOW (ref 0.7–4.0)
MCH: 31.4 pg (ref 26.0–34.0)
MCHC: 31.1 g/dL (ref 30.0–36.0)
MCV: 100.8 fL — ABNORMAL HIGH (ref 80.0–100.0)
Monocytes Absolute: 0.1 10*3/uL (ref 0.1–1.0)
Monocytes Relative: 2 %
Neutro Abs: 5.1 10*3/uL (ref 1.7–7.7)
Neutrophils Relative %: 95 %
Platelets: 84 10*3/uL — ABNORMAL LOW (ref 150–400)
RBC: 2.36 MIL/uL — ABNORMAL LOW (ref 4.22–5.81)
RDW: 18.6 % — ABNORMAL HIGH (ref 11.5–15.5)
WBC: 5.4 10*3/uL (ref 4.0–10.5)
nRBC: 0.4 % — ABNORMAL HIGH (ref 0.0–0.2)

## 2022-06-22 LAB — MAGNESIUM: Magnesium: 2.1 mg/dL (ref 1.7–2.4)

## 2022-06-22 LAB — GLUCOSE, CAPILLARY
Glucose-Capillary: 157 mg/dL — ABNORMAL HIGH (ref 70–99)
Glucose-Capillary: 159 mg/dL — ABNORMAL HIGH (ref 70–99)
Glucose-Capillary: 221 mg/dL — ABNORMAL HIGH (ref 70–99)
Glucose-Capillary: 259 mg/dL — ABNORMAL HIGH (ref 70–99)
Glucose-Capillary: 299 mg/dL — ABNORMAL HIGH (ref 70–99)

## 2022-06-22 LAB — BODY FLUID CULTURE W GRAM STAIN
Culture: NO GROWTH
Gram Stain: NONE SEEN

## 2022-06-22 LAB — PATHOLOGIST SMEAR REVIEW: Path Review: NEGATIVE

## 2022-06-22 MED ORDER — LIDOCAINE HCL (PF) 1 % IJ SOLN: 5.0000 mL | INTRAMUSCULAR | Status: DC | PRN

## 2022-06-22 MED ORDER — ACETAMINOPHEN 325 MG PO TABS
650.0000 mg | ORAL_TABLET | Freq: Four times a day (QID) | ORAL | Status: DC | PRN
  Administered 2022-06-23: 650 mg
  Filled 2022-06-22: qty 2

## 2022-06-22 MED ORDER — GUAIFENESIN-DM 100-10 MG/5ML PO SYRP: 5.0000 mL | ORAL_SOLUTION | ORAL | Status: DC | PRN

## 2022-06-22 MED ORDER — DEXMEDETOMIDINE HCL IN NACL 400 MCG/100ML IV SOLN
0.0000 ug/kg/h | INTRAVENOUS | Status: DC
  Administered 2022-06-22: 0.2 ug/kg/h via INTRAVENOUS
  Administered 2022-06-23: 0.8 ug/kg/h via INTRAVENOUS
  Administered 2022-06-23: 1.2 ug/kg/h via INTRAVENOUS
  Administered 2022-06-23: 1 ug/kg/h via INTRAVENOUS
  Administered 2022-06-24 – 2022-06-25 (×7): 1.2 ug/kg/h via INTRAVENOUS
  Filled 2022-06-22 (×13): qty 100

## 2022-06-22 MED ORDER — POLYSACCHARIDE IRON COMPLEX 150 MG PO CAPS
150.0000 mg | ORAL_CAPSULE | Freq: Every day | ORAL | Status: DC
  Administered 2022-06-23 – 2022-06-25 (×3): 150 mg
  Filled 2022-06-22 (×3): qty 1

## 2022-06-22 MED ORDER — ORAL CARE MOUTH RINSE: 15.0000 mL | OROMUCOSAL | Status: DC | PRN

## 2022-06-22 MED ORDER — MIRTAZAPINE 15 MG PO TABS
30.0000 mg | ORAL_TABLET | Freq: Every day | ORAL | Status: DC
  Administered 2022-06-22 – 2022-06-24 (×3): 30 mg
  Filled 2022-06-22 (×3): qty 2

## 2022-06-22 MED ORDER — ONDANSETRON HCL 4 MG/2ML IJ SOLN: 4.0000 mg | Freq: Four times a day (QID) | INTRAMUSCULAR | Status: DC | PRN

## 2022-06-22 MED ORDER — ALLOPURINOL 100 MG PO TABS
100.0000 mg | ORAL_TABLET | ORAL | Status: DC
  Administered 2022-06-22 – 2022-06-24 (×2): 100 mg
  Filled 2022-06-22 (×2): qty 1

## 2022-06-22 MED ORDER — PENTAFLUOROPROP-TETRAFLUOROETH EX AERO: 1.0000 | INHALATION_SPRAY | CUTANEOUS | Status: DC | PRN

## 2022-06-22 MED ORDER — HEPARIN SODIUM (PORCINE) 1000 UNIT/ML DIALYSIS: 1000.0000 [IU] | INTRAMUSCULAR | Status: DC | PRN

## 2022-06-22 MED ORDER — ROCURONIUM BROMIDE 10 MG/ML (PF) SYRINGE
PREFILLED_SYRINGE | INTRAVENOUS | Status: AC
  Administered 2022-06-22: 100 mg
  Filled 2022-06-22: qty 10

## 2022-06-22 MED ORDER — FENTANYL CITRATE PF 50 MCG/ML IJ SOSY
25.0000 ug | PREFILLED_SYRINGE | INTRAMUSCULAR | Status: AC | PRN
  Administered 2022-06-22 – 2022-06-23 (×2): 25 ug via INTRAVENOUS
  Filled 2022-06-22 (×3): qty 1

## 2022-06-22 MED ORDER — PRAVASTATIN SODIUM 10 MG PO TABS
20.0000 mg | ORAL_TABLET | Freq: Every morning | ORAL | Status: DC
  Administered 2022-06-23 – 2022-06-25 (×3): 20 mg
  Filled 2022-06-22 (×4): qty 2

## 2022-06-22 MED ORDER — ORAL CARE MOUTH RINSE
15.0000 mL | OROMUCOSAL | Status: DC
  Administered 2022-06-23 (×6): 15 mL via OROMUCOSAL

## 2022-06-22 MED ORDER — SODIUM CHLORIDE 0.9 % IV SOLN: 250.0000 mL | INTRAVENOUS | Status: DC

## 2022-06-22 MED ORDER — ETOMIDATE 2 MG/ML IV SOLN
INTRAVENOUS | Status: AC
  Administered 2022-06-22: 20 mg
  Filled 2022-06-22: qty 10

## 2022-06-22 MED ORDER — ONDANSETRON HCL 4 MG PO TABS: 4.0000 mg | ORAL_TABLET | Freq: Four times a day (QID) | ORAL | Status: DC | PRN

## 2022-06-22 MED ORDER — FUROSEMIDE 10 MG/ML IJ SOLN
40.0000 mg | Freq: Once | INTRAMUSCULAR | Status: AC
  Administered 2022-06-22: 40 mg via INTRAVENOUS
  Filled 2022-06-22: qty 4

## 2022-06-22 MED ORDER — INSULIN ASPART 100 UNIT/ML IJ SOLN
0.0000 [IU] | Freq: Three times a day (TID) | INTRAMUSCULAR | Status: DC
  Administered 2022-06-22: 4 [IU] via SUBCUTANEOUS
  Administered 2022-06-22: 11 [IU] via SUBCUTANEOUS

## 2022-06-22 MED ORDER — POLYETHYLENE GLYCOL 3350 17 G PO PACK: 17.0000 g | PACK | Freq: Every day | ORAL | Status: DC | PRN

## 2022-06-22 MED ORDER — RIFAXIMIN 550 MG PO TABS
550.0000 mg | ORAL_TABLET | Freq: Two times a day (BID) | ORAL | Status: DC
  Administered 2022-06-22 – 2022-06-25 (×6): 550 mg
  Filled 2022-06-22 (×6): qty 1

## 2022-06-22 MED ORDER — FENTANYL CITRATE PF 50 MCG/ML IJ SOSY
25.0000 ug | PREFILLED_SYRINGE | INTRAMUSCULAR | Status: DC | PRN
  Administered 2022-06-23 – 2022-06-24 (×7): 50 ug via INTRAVENOUS
  Filled 2022-06-22 (×7): qty 1

## 2022-06-22 MED ORDER — INSULIN ASPART 100 UNIT/ML IJ SOLN
0.0000 [IU] | INTRAMUSCULAR | Status: DC
  Administered 2022-06-22 – 2022-06-23 (×7): 4 [IU] via SUBCUTANEOUS
  Administered 2022-06-24: 11 [IU] via SUBCUTANEOUS
  Administered 2022-06-24: 4 [IU] via SUBCUTANEOUS
  Administered 2022-06-24: 7 [IU] via SUBCUTANEOUS
  Administered 2022-06-24: 11 [IU] via SUBCUTANEOUS
  Administered 2022-06-24: 4 [IU] via SUBCUTANEOUS
  Administered 2022-06-25: 3 [IU] via SUBCUTANEOUS
  Administered 2022-06-25 (×2): 4 [IU] via SUBCUTANEOUS
  Administered 2022-06-25: 3 [IU] via SUBCUTANEOUS

## 2022-06-22 MED ORDER — ALUM & MAG HYDROXIDE-SIMETH 200-200-20 MG/5ML PO SUSP: 15.0000 mL | ORAL | Status: DC | PRN

## 2022-06-22 MED ORDER — POLYETHYLENE GLYCOL 3350 17 G PO PACK: 17.0000 g | PACK | Freq: Every day | ORAL | Status: DC

## 2022-06-22 MED ORDER — LACTULOSE 10 GM/15ML PO SOLN: 20.0000 g | Freq: Two times a day (BID) | ORAL | Status: DC

## 2022-06-22 MED ORDER — NOREPINEPHRINE 4 MG/250ML-% IV SOLN
2.0000 ug/min | INTRAVENOUS | Status: DC
  Administered 2022-06-23: 2 ug/min via INTRAVENOUS
  Administered 2022-06-23: 10 ug/min via INTRAVENOUS
  Filled 2022-06-22 (×2): qty 250

## 2022-06-22 MED ORDER — LACTULOSE 10 GM/15ML PO SOLN
30.0000 g | Freq: Three times a day (TID) | ORAL | Status: DC
  Administered 2022-06-22: 30 g via ORAL
  Filled 2022-06-22: qty 45

## 2022-06-22 MED ORDER — DOCUSATE SODIUM 50 MG/5ML PO LIQD: 100.0000 mg | Freq: Two times a day (BID) | ORAL | Status: DC

## 2022-06-22 MED ORDER — VENLAFAXINE HCL 75 MG PO TABS
75.0000 mg | ORAL_TABLET | Freq: Three times a day (TID) | ORAL | Status: DC
  Administered 2022-06-23 – 2022-06-25 (×8): 75 mg
  Filled 2022-06-22 (×9): qty 1

## 2022-06-22 MED ORDER — METHYLPREDNISOLONE SODIUM SUCC 40 MG IJ SOLR
40.0000 mg | Freq: Once | INTRAMUSCULAR | Status: AC
  Administered 2022-06-22: 40 mg via INTRAVENOUS
  Filled 2022-06-22: qty 1

## 2022-06-22 MED ORDER — ACETAMINOPHEN 650 MG RE SUPP: 650.0000 mg | Freq: Four times a day (QID) | RECTAL | Status: DC | PRN

## 2022-06-22 MED ORDER — LIDOCAINE-PRILOCAINE 2.5-2.5 % EX CREA: 1.0000 | TOPICAL_CREAM | CUTANEOUS | Status: DC | PRN

## 2022-06-22 MED ORDER — ANTICOAGULANT SODIUM CITRATE 4% (200MG/5ML) IV SOLN: 5.0000 mL | Status: DC | PRN

## 2022-06-22 MED ORDER — SULFAMETHOXAZOLE-TRIMETHOPRIM 800-160 MG PO TABS: 1.0000 | ORAL_TABLET | ORAL | Status: DC

## 2022-06-22 MED ORDER — ALTEPLASE 2 MG IJ SOLR: 2.0000 mg | Freq: Once | INTRAMUSCULAR | Status: DC | PRN

## 2022-06-22 NOTE — Progress Notes (Signed)
RT was called to bedside to assess pt. RN stated over the phone that pt's SpO2 read in the 70's and RN placed pt on NRB. RT at bedside to find pt on 15L NRB hooked to [place] bottle. Pt kept removing NBB mask from face. RT placed pt on NRB 15L to flow meter. MD at bedside as well and ordered ABG. RT obtained ABG and placed pt on 15L [place]. RN currently at bedside.

## 2022-06-22 NOTE — Progress Notes (Signed)
15:10 RT called to place pt on BIPAP in dialysis.  Pt was placed on BIPAP but not tolerating due to AMS.  Rapid response called BP dropped 60/44 and SPO2 84 with good waveform.  Dialysis was stopped.  BP increased 136/56 and SPO2 100% on 100% FIO2.  CCM was consulted after CCM assessment patient is to be transferred to 95M for intubation.

## 2022-06-22 NOTE — Progress Notes (Addendum)
[place] Progress Note Patient Name: Jeffrey [name] DOB: [birth year] MRN: [id number]   Date of Service  06/22/2022  HPI/Events of Note  Intubated today for hypercarbic respiratory failure  Needs additional sedation for agitation however hypotensive to 70-80s with low dose precedex  eICU Interventions  Will plan for USG IV for peripheral pressors however will likely need a line Ground team will place if available however may need to wait until day team arrives   US guided IV failed. Patient persistently agitated and became hypotensive. Low dose levo started in non-US guided IV to maintain pressures. Ground team will be at bedside when able. Continue current meds for now  Intervention Category Intermediate Interventions: Hypotension - evaluation and management  Byrd Terrero Rodman Pickle 06/22/2022, 11:43 PM

## 2022-06-22 NOTE — Progress Notes (Signed)
PT belongings were sent up from prior unit. Belongings include upper and lower dentures,an Iwatch with black bands, a Black hygiene kit with 2x nail clippers, tweezers, comb, scissors, lint brush and 2x nail files. Placed in PT belonging bag.   Jeffrey [name]

## 2022-06-22 NOTE — Significant Event (Addendum)
Rapid Response Event Note   Reason for Call :  Call from RT, pt recently arrived to HD from 5N headed to [facility] for new Bipap orders, ABG 7.16/69/137  Initial Focused Assessment:  Patient lying in bed unable to follow commands, MAE, skin cool and dry. Eyes open. Heart and lungs clear.   VS 60/44 (51) HR 86 RR 27 O2: unable to read; changing probe, 84%  on 45% FiO2 BiPAP  Interventions:  HD stopped FiO2 increased to 100% on BiPAP Restraints removed from patient Ardeen Jourdain MD to bedside Nephro MD to bedside PCCM consult, to bedside  Plan of Care:  Transfer to ICU, plan to intubate. Addendum: Patient transferred to 2M02 once room cleaned, intubated upon arrival  Event Summary:  MD Notified: Ardeen Jourdain MD Call Time: [place] Time: [place] Time: 1705  Newman Nickels, RN

## 2022-06-22 NOTE — Significant Event (Addendum)
pH: 7.16 pCO2: 69  Attending MD and RT notified.  Patient desat on HFNC, nonrebreather reapplied sating at 100%

## 2022-06-22 NOTE — Progress Notes (Signed)
Progress Note    Jeffrey [name]   FTD:[id number]  DOB: Oct 08, 1945  DOA: 06/20/2022     7 PCP: Monico Blitz, MD  Initial CC: SOB  Hospital Course: Jeffrey [name] is a 77 yo male with PMH DMII, HTN, GI bleed, chronic respiratory failure on 2 L [place], atrial fibrillation, cirrhosis, ESRD on HD MWF who originally presented to [facility] on 06/14/2022 for scheduled paracentesis.  Due to having shortness of breath he was unable to lay flat.  Further workup revealed positive for influenza and he required BiPAP briefly.  He was transferred to [facility] for dialysis.  Interval History:  Still confused but appears slightly improved this morning.  He is more awake and talking.  Had not eaten much breakfast when seen. Earlier this afternoon, patient had large desaturation and was placed on nonrebreather. ABG obtained and CXR ordered as well.  Patient to be transferred to progressive and started on Bipap. Tentative plans for HD this afternoon as well.   Assessment and Plan: * Acute on chronic respiratory failure with hypoxia (HCC) - Baseline of 2 L nasal cannula.   -Initially required BiPAP which has been weaned to approximately 5 L - seems to be having some respiratory distress every couple days from volume issues - acute desat on 1/22: ABG obtained 12/15/67/137. Bicarb 25 on BMP; this appears a respi acidosis with attempted metabolic compensation; etiology suspected volume overload from missed HD on Friday; other consideration would be possible PE and/or sedation from encephalopathy but mentation has appeared better over the past 24 hours so seems less likely - needs BIPAP again; may need to tx to unit able to accept  Influenza A - Flu swab positive for H1 -Tamiflu course completed -Continue droplet precautions -Steroids added 06/19/2022 due to worsening oxygen requirements  Acute hepatic encephalopathy ([place]) - Patient developed worsening confusion on 06/19/2022 - Has been refusing evening dose of  lactulose.  Likely has developed hepatic encephalopathy - NH3 elevated as expected at 33 - will convert to lactulose enema if necessary - monitor mentation  -Increase lactulose dose 06/21/2022  ESRD on dialysis [facility]) - Appreciate nephrology assistance, continue dialysis per schedule - continue inpatient HD as per nephrology   Alcoholic cirrhosis of liver with ascites ([place]) Continue lactulose and rifaximin - last paracentesis 11/27 -Patient originally was planning to undergo paracentesis at [facility] -Paracentesis able to be performed on 06/18/2022, 2.3 L fluid removed; negative for SBP per PMN count; culture negative  Esophageal varices in alcoholic cirrhosis (HCC) Currently stable with no evidence of acute bleeding.  Continue Inderal  Acute hyponatremia Secondary to hypervolemia.  Manage volumes with dialysis and Lasix  Controlled type 2 diabetes mellitus with chronic kidney disease on chronic dialysis, with long-term current use of insulin (HCC) Continue insulin and sliding scale.   Old records reviewed in assessment of this patient  Antimicrobials: Tamiflu 07/01/2022 >> 06/19/22  DVT prophylaxis:  heparin injection 5,000 Units Start: 06/16/22 0600   Code Status:   Code Status: Full Code  Mobility Assessment (last 72 hours)     Mobility Assessment     Row Name 06/22/22 1000 06/21/22 2051 06/21/22 0900 06/20/22 1000 06/20/22 0900   Does patient have an order for bedrest or is patient medically unstable -- Yes- Bedfast (Level 1) - Complete Yes- Bedfast (Level 1) - Complete -- Yes- Bedfast (Level 1) - Complete   What is the highest level of mobility based on the progressive mobility assessment? Level 3 (Stands with assist) - Balance  while standing  and cannot march in place Level 2 (Chairfast) - Balance while sitting on edge of bed and cannot stand Level 2 (Chairfast) - Balance while sitting on edge of bed and cannot stand Level 2 (Chairfast) - Balance while sitting on edge of bed  and cannot stand Level 2 (Chairfast) - Balance while sitting on edge of bed and cannot stand   Is the above level different from baseline mobility prior to current illness? -- Yes - Recommend PT order Yes - Recommend PT order -- Yes - Recommend PT order    Row Name 06/20/22 0400 06/20/22 0000 06/19/22 2045 06/19/22 1657 06/19/22 1547   Does patient have an order for bedrest or is patient medically unstable Yes- Bedfast (Level 1) - Complete Yes- Bedfast (Level 1) - Complete Yes- Bedfast (Level 1) - Complete -- No - Continue assessment   What is the highest level of mobility based on the progressive mobility assessment? Level 2 (Chairfast) - Balance while sitting on edge of bed and cannot stand Level 2 (Chairfast) - Balance while sitting on edge of bed and cannot stand Level 2 (Chairfast) - Balance while sitting on edge of bed and cannot stand Level 2 (Chairfast) - Balance while sitting on edge of bed and cannot stand Level 4 (Walks with assist in room) - Balance while marching in place and cannot step forward and back - Complete   Is the above level different from baseline mobility prior to current illness? Yes - Recommend PT order Yes - Recommend PT order Yes - Recommend PT order -- Yes - Recommend PT order            Barriers to discharge:  Disposition Plan: Home 1 to 2 days Status is: Inpatient  Objective: Blood pressure (!) 128/47, pulse 86, temperature 97.9 F (36.6 C), temperature source Oral, resp. rate 17, weight 66.3 kg, SpO2 100 %.  Examination:  Physical Exam Constitutional:      General: He is not in acute distress.    Comments: Mentation a little better this morning.  Not as confused.  Following commands easily and is awake, alert.  HENT:     Head: Normocephalic and atraumatic.     Mouth/Throat:     Mouth: Mucous membranes are moist.  Eyes:     Extraocular Movements: Extraocular movements intact.  Cardiovascular:     Rate and Rhythm: Normal rate and regular rhythm.      Heart sounds: Normal heart sounds.  Pulmonary:     Effort: Pulmonary effort is normal. No respiratory distress.     Breath sounds: No wheezing.     Comments: Coarse breath sounds bilaterally Abdominal:     General: Bowel sounds are normal. There is no distension.     Palpations: Abdomen is soft.     Tenderness: There is no abdominal tenderness.  Musculoskeletal:        General: Normal range of motion.     Cervical back: Normal range of motion and neck supple.  Skin:    General: Skin is warm and dry.  Neurological:     Mental Status: He is alert. He is disoriented.     Comments: Follows commands and moves all 4 extremities  Psychiatric:        Mood and Affect: Mood normal.        Behavior: Behavior normal.      Consultants:  Nephrology  Procedures:  06/18/22: Paracentesis   Data Reviewed: Results for orders placed or performed during the  hospital encounter of 06/08/2022 (from the past 24 hour(s))  Glucose, capillary     Status: Abnormal   Collection Time: 06/21/22  4:01 PM  Result Value Ref Range   Glucose-Capillary 286 (H) 70 - 99 mg/dL  Glucose, capillary     Status: Abnormal   Collection Time: 06/21/22  8:06 PM  Result Value Ref Range   Glucose-Capillary 210 (H) 70 - 99 mg/dL  Renal function panel     Status: Abnormal   Collection Time: 06/22/22  3:33 AM  Result Value Ref Range   Sodium 131 (L) 135 - 145 mmol/L   Potassium 4.3 3.5 - 5.1 mmol/L   Chloride 92 (L) 98 - 111 mmol/L   CO2 25 22 - 32 mmol/L   Glucose, Bld 214 (H) 70 - 99 mg/dL   BUN 103 (H) 8 - 23 mg/dL   Creatinine, Ser 4.13 (H) 0.61 - 1.24 mg/dL   Calcium 8.9 8.9 - 10.3 mg/dL   Phosphorus 4.2 2.5 - 4.6 mg/dL   Albumin 2.0 (L) 3.5 - 5.0 g/dL   GFR, Estimated 14 (L) >60 mL/min   Anion gap 14 5 - 15  CBC with Differential/Platelet     Status: Abnormal   Collection Time: 06/22/22  3:33 AM  Result Value Ref Range   WBC 5.4 4.0 - 10.5 K/uL   RBC 2.36 (L) 4.22 - 5.81 MIL/uL   Hemoglobin 7.4 (L) 13.0 -  17.0 g/dL   HCT 23.8 (L) 39.0 - 52.0 %   MCV 100.8 (H) 80.0 - 100.0 fL   MCH 31.4 26.0 - 34.0 pg   MCHC 31.1 30.0 - 36.0 g/dL   RDW 18.6 (H) 11.5 - 15.5 %   Platelets 84 (L) 150 - 400 K/uL   nRBC 0.4 (H) 0.0 - 0.2 %   Neutrophils Relative % 95 %   Neutro Abs 5.1 1.7 - 7.7 K/uL   Lymphocytes Relative 2 %   Lymphs Abs 0.1 (L) 0.7 - 4.0 K/uL   Monocytes Relative 2 %   Monocytes Absolute 0.1 0.1 - 1.0 K/uL   Eosinophils Relative 0 %   Eosinophils Absolute 0.0 0.0 - 0.5 K/uL   Basophils Relative 0 %   Basophils Absolute 0.0 0.0 - 0.1 K/uL   Immature Granulocytes 1 %   Abs Immature Granulocytes 0.03 0.00 - 0.07 K/uL  Magnesium     Status: None   Collection Time: 06/22/22  3:33 AM  Result Value Ref Range   Magnesium 2.1 1.7 - 2.4 mg/dL  Glucose, capillary     Status: Abnormal   Collection Time: 06/22/22  8:03 AM  Result Value Ref Range   Glucose-Capillary 259 (H) 70 - 99 mg/dL  Glucose, capillary     Status: Abnormal   Collection Time: 06/22/22 11:20 AM  Result Value Ref Range   Glucose-Capillary 299 (H) 70 - 99 mg/dL  Blood gas, arterial     Status: Abnormal   Collection Time: 06/22/22  1:02 PM  Result Value Ref Range   pH, Arterial 7.16 (LL) 7.35 - 7.45   pCO2 arterial 69 (HH) 32 - 48 mmHg   pO2, Arterial 137 (H) 83 - 108 mmHg   Bicarbonate 24.7 20.0 - 28.0 mmol/L   Acid-base deficit 5.6 (H) 0.0 - 2.0 mmol/L   O2 Saturation UNABLE TO CALCULATE %   Patient temperature 36.4    Collection site RIGHT RADIAL    Drawn by [id number]    Allens test (pass/fail) PASS PASS    I  have reviewed pertinent nursing notes, vitals, labs, and images as necessary. I have ordered labwork to follow up on as indicated.  I have reviewed the last notes from staff over past 24 hours. I have discussed patient's care plan and test results with nursing staff, CM/SW, and other staff as appropriate.  Time spent: Greater than 50% of the 55 minute visit was spent in counseling/coordination of care for the  patient as laid out in the A&P.   LOS: 7 days   Dwyane Dee, MD [facility] 06/22/2022, 2:09 PM

## 2022-06-22 NOTE — Progress Notes (Addendum)
Received patient in bed to unit.  Not alert.and confused . Informed consent signed and in chart.   Treatment initiated: 1507 Treatment completed: 1535  Patient not tolerated well.  Transported back to ICU  Dr. Jonnie Finner notified. Will do HD bedside. Hand-off given to patient's nurse.   Access used: AVF Access issues: none  Total UF removed: 0 Medication(s) given: none Post HD VS: 137/56 P 84 R15 O2 sat 100% on BPAP Post HD weight: 66.3 Kg   [facility]

## 2022-06-22 NOTE — Progress Notes (Signed)
[facility] Progress Note   Subjective:  Seen in room. Trying to eat breakfast. Confused, incoherent responses to questions. Dialysis today.   Objective Vitals:   06/21/22 1250 06/21/22 2051 06/22/22 0520 06/22/22 0803  BP: (!) 135/55 (!) 148/64 (!) 147/81 (!) 148/60  Pulse: 93 89 90 87  Resp: 20 18 (!) 21   Temp: 98.2 F (36.8 C) (!) 97.5 F (36.4 C) 97.6 F (36.4 C) 97.8 F (36.6 C)  TempSrc: Oral Oral Oral Oral  SpO2: 100% 100% 99% 90%  Weight:       Physical Exam General: chronically ill appearing male in NAD Heart: RRR  Lungs: BS decreased, nml WOB on 5L O2 Abdomen: soft, NT Extremities: no LE edema Dialysis Access: LU AVF +b/t   Filed Weights   06/17/22 0814 06/17/22 1253 06/19/22 0913  Weight: 74.3 kg 70.2 kg 66.3 kg    Intake/Output Summary (Last 24 hours) at 06/22/2022 1132 Last data filed at 06/22/2022 0900 Gross per 24 hour  Intake 240 ml  Output 0 ml  Net 240 ml     Additional Objective Labs: Basic Metabolic Panel: Recent Labs  Lab 06/20/22 0344 06/21/22 0403 06/22/22 0333  NA 131* 131* 131*  K 3.8 4.2 4.3  CL 92* 92* 92*  CO2 '25 26 25  '$ GLUCOSE 280* 239* 214*  BUN 40* 73* 103*  CREATININE 2.47* 3.41* 4.13*  CALCIUM 8.4* 8.9 8.9  PHOS 3.9 4.4 4.2    Liver Function Tests: Recent Labs  Lab 06/17/22 0425 06/19/22 0430 06/20/22 0344 06/21/22 0403 06/22/22 0333  AST 35  --   --   --   --   ALT 26  --   --   --   --   ALKPHOS 208*  --   --   --   --   BILITOT 1.0  --   --   --   --   PROT 5.8*  --   --   --   --   ALBUMIN 1.9*   < > 1.8* 2.0* 2.0*   < > = values in this interval not displayed.    CBC: Recent Labs  Lab 06/17/22 0425 06/17/22 0425 06/19/22 0430 06/20/22 0344 06/21/22 0403 06/22/22 0333  WBC 6.3  --  8.6 5.5 7.8 5.4  NEUTROABS  --    < > 7.9* 5.0 7.5 5.1  HGB 8.4*  --  8.5* 8.2* 8.2* 7.4*  HCT 26.6*  --  26.9* 26.4* 25.4* 23.8*  MCV 99.6  --  99.3 101.5* 100.0 100.8*  PLT 105*  --  109* 105*  102* 84*   < > = values in this interval not displayed.    Blood Culture    Component Value Date/Time   SDES ABDOMEN PERITONEAL 06/18/2022 1302   SPECREQUEST NONE 06/18/2022 1302   CULT  06/18/2022 1302    NO GROWTH 3 DAYS Performed at [facility], [place] [address] [place], [place] [address]    REPTSTATUS 06/22/2022 FINAL 06/18/2022 1302    CBG: Recent Labs  Lab 06/21/22 1154 06/21/22 1601 06/21/22 2006 06/22/22 0803 06/22/22 1120  GLUCAP 325* 286* 210* 259* 299*     Medications:   allopurinol  100 mg Oral Q M,W,F-2000   ALPRAZolam  0.5 mg Oral Q M,W,F   Chlorhexidine Gluconate Cloth  6 each Topical Q0600   darbepoetin (ARANESP) injection - DIALYSIS  200 mcg Subcutaneous Q Mon-1800   diltiazem  120 mg Oral Daily   heparin  5,000 Units Subcutaneous Q8H   insulin aspart  0-20 Units Subcutaneous TID WC   insulin aspart  0-5 Units Subcutaneous QHS   iron polysaccharides  150 mg Oral Daily   lactulose  20 g Oral BID   methylPREDNISolone (SOLU-MEDROL) injection  40 mg Intravenous BID   metoprolol succinate  25 mg Oral QHS   And   metoprolol succinate  25 mg Oral Once per day on Sun Tue Thu Sat   mirtazapine  30 mg Oral QHS   pravastatin  20 mg Oral q AM   rifaximin  550 mg Oral BID   sulfamethoxazole-trimethoprim  1 tablet Oral Weekly   venlafaxine  75 mg Oral TID WC    Dialysis Orders: [facility] MWF 4:00 EDW 80.5kg  350/500 2K/2.5Ca AVF No heparin Mircera 200 (due 1/15)    Assessment/Plan: Dyspnea/respiratory distress -secondary to volume overload/Influenza A +--initially requiring BiPAP-now off. Still with increased O2 requirement. 2L [place] at baseline. Improved with HD on schedule. Weaning down O2 but remains on 5L. CXR 1/19 with interstitial edema, unable to complete extra HD Saturday.  Encephalopathy - Likely 2/2 refusing lactulose.  ESRD -  HD MWF. Continue on schedule. Next HD 1/22  Hypertension/volume  - BP variable. Continue UF as tolerated.  Anemia   - Hb 8.2. s/p transfusion on 1/11. Aranesp 200 q Monday  Metabolic bone disease -  Ca and phos in goal. No VDRA as outpatient  Nutrition - Renal diet with fluid restriction.  Hx of cirrhosis with ascites - per primary team.  Discussed importance of taking lactulose BID.  Had paracentesis with 2.3L off 1/19.   Flu A +- tamiflu Dispo: pending  Jeffrey Child PA-C [facility] 06/22/2022,11:32 AM

## 2022-06-22 NOTE — Significant Event (Signed)
Patient noted with respiratory distress on 4L [place] sating in the high 70s. He is currently on non-rebreather sating at 100%. MD/RT at bedside at bedside. Orders received.

## 2022-06-22 NOTE — Progress Notes (Signed)
PT Cancellation Note  Patient Details Name: Jeffrey [name] MRN: [id number] DOB: [birth year]   Cancelled Treatment:    Reason Eval/Treat Not Completed: (P) Medical issues which prohibited therapy (pt with increased respiratory needs currently, RN defer until later in day.) Will continue efforts per PT plan of care as schedule permits.   Kara Pacer Ambra Haverstick 06/22/2022, 12:58 PM

## 2022-06-22 NOTE — Inpatient Diabetes Management (Signed)
Inpatient Diabetes Program Recommendations  AACE/ADA: New Consensus Statement on Inpatient Glycemic Control   Target Ranges:  Prepandial:   less than 140 mg/dL      Peak postprandial:   less than 180 mg/dL (1-2 hours)      Critically ill patients:  140 - 180 mg/dL    Latest Reference Range & Units 06/21/22 08:06 06/21/22 11:54 06/21/22 16:01 06/21/22 20:06 06/22/22 08:03  Glucose-Capillary 70 - 99 mg/dL 270 (H) 325 (H) 286 (H) 210 (H) 259 (H)   Review of Glycemic Control  Diabetes history: DM2 Outpatient Diabetes medications: Tresiba 10-50 units QHS as needed for hyperglycemia per sliding scale, Humalog 10-50 units TID per sliding scale Current orders for Inpatient glycemic control: Novolog 0-20 units TID with meals, Novolog 0-5 units QHS; Solumedrol 40 mg BID  Inpatient Diabetes Program Recommendations:    Insulin: If steroids are continued, please consider ordering Semglee 10 units Q24H and Novolog 3 units TID with meals for meal coverage if patient eats at least 50% of meals.   Thanks, Barnie Alderman, RN, MSN, [place] Diabetes Coordinator Inpatient Diabetes Program [phone] (Team Pager from 8am to [place])

## 2022-06-22 NOTE — Progress Notes (Addendum)
Patient is on his way to HD, HD staff aware that patient will need to be placed on bipap. RT aware.  Patient will need to be transfer to 2C01 post HD. Report given to Danton Sewer, RN 2C.

## 2022-06-22 NOTE — Progress Notes (Signed)
Occupational Therapy Treatment Patient Details Name: Jeffrey [name] MRN: [id number] DOB: [birth year] Today's Date: 06/22/2022   History of present illness Patient is a 77 year old male admitted 06/20/2022 due to SOB/acute respiratory distress found to be positive for flu. PMH: DM, HTN, GI bleed, chronic respiratory failure on 2 L nasal cannula, afib, ESRD on HD and cirrhosis requiring periodic paracentesis.   OT comments  Patient received in supine and alert. Patient not oriented to place, time, or situation and required increased time to follow directions. Patient improved since last visit with mod assist from max assist with transfers and improvement following directions. Patient continues to require max assist with LB dressing. Patient to continue to be followed by acute OT with discharge recommendation possible change if patient continues to require increased assistance with transfers.    Recommendations for follow up therapy are one component of a multi-disciplinary discharge planning process, led by the attending physician.  Recommendations may be updated based on patient status, additional functional criteria and insurance authorization.    Follow Up Recommendations  Home health OT     Assistance Recommended at Discharge Frequent or constant Supervision/Assistance  Patient can return home with the following  A lot of help with walking and/or transfers;A lot of help with bathing/dressing/bathroom;Assistance with cooking/housework;Help with stairs or ramp for entrance;Assist for transportation   Equipment Recommendations  None recommended by OT    Recommendations for Other Services      Precautions / Restrictions Precautions Precautions: Fall Precaution Comments: watch HR & SpO2 Restrictions Weight Bearing Restrictions: No       Mobility Bed Mobility Overal bed mobility: Needs Assistance Bed Mobility: Supine to Sit, Sit to Supine     Supine to sit: HOB elevated, Min assist Sit  to supine: Mod assist   General bed mobility comments: cues for rail use and assisstance to scoot to EOB and required assistance with LEs to return to supine    Transfers Overall transfer level: Needs assistance Equipment used: Rolling walker (2 wheels) Transfers: Sit to/from Stand, Bed to chair/wheelchair/BSC Sit to Stand: Mod assist Stand pivot transfers: Mod assist         General transfer comment: required mod assisst for walker use, balance, and safety     Balance Overall balance assessment: Needs assistance Sitting-balance support: Feet supported, Single extremity supported Sitting balance-Leahy Scale: Poor Sitting balance - Comments: min guard for sitting balance on EOB   Standing balance support: Bilateral upper extremity supported, During functional activity, Reliant on assistive device for balance Standing balance-Leahy Scale: Poor Standing balance comment: reliant on RW for balance                           ADL either performed or assessed with clinical judgement   ADL Overall ADL's : Needs assistance/impaired     Grooming: Wash/dry hands;Wash/dry face;Set up;Sitting;Cueing for sequencing               Lower Body Dressing: Maximal assistance;Sit to/from stand Lower Body Dressing Details (indicate cue type and reason): able to get into figure 4 position but unable to doff or donn socks without assistancee Toilet Transfer: Moderate assistance;Stand-pivot;Rolling walker (2 wheels) Toilet Transfer Details (indicate cue type and reason): simulated           General ADL Comments: slow processing, requiring frequent cues    Extremity/Trunk Assessment              Vision  Perception     Praxis      Cognition Arousal/Alertness: Awake/alert Behavior During Therapy: Flat affect Overall Cognitive Status: Impaired/Different from baseline Area of Impairment: Safety/judgement, Problem solving, Attention, Following commands, Awareness,  Memory                 Orientation Level: Disoriented to, Place, Time, Situation   Memory: Decreased short-term memory Following Commands: Follows one step commands inconsistently Safety/Judgement: Decreased awareness of safety, Decreased awareness of deficits   Problem Solving: Slow processing, Decreased initiation, Difficulty sequencing, Requires tactile cues, Requires verbal cues General Comments: more alert and able to follow directions. Believed he was in [place] and thought it was Friday        Exercises      Shoulder Instructions       General Comments on 2 liters with SpO2 100%, HR 88, BP 130/52    Pertinent Vitals/ Pain       Pain Assessment Pain Assessment: Faces Faces Pain Scale: Hurts a little bit Pain Location: buttocks Pain Descriptors / Indicators: Grimacing Pain Intervention(s): Monitored during session, Repositioned  Home Living                                          Prior Functioning/Environment              Frequency  Min 2X/week        Progress Toward Goals  OT Goals(current goals can now be found in the care plan section)  Progress towards OT goals: Progressing toward goals  Acute Rehab OT Goals Patient Stated Goal: none stated OT Goal Formulation: With patient Time For Goal Achievement: 07/02/22 Potential to Achieve Goals: Good ADL Goals Pt Will Perform Lower Body Bathing: with min assist;sitting/lateral leans;sit to/from stand Pt Will Perform Lower Body Dressing: with min assist;sitting/lateral leans;sit to/from stand Pt Will Transfer to Toilet: with supervision;ambulating;regular height toilet Pt Will Perform Toileting - Clothing Manipulation and hygiene: with min assist;sitting/lateral leans;sit to/from stand Pt/caregiver will Perform Home Exercise Program: Increased strength;Both right and left upper extremity;With Supervision;With written HEP provided  Plan Discharge plan remains appropriate;Frequency  remains appropriate    Co-evaluation                 AM-PAC OT "6 Clicks" Daily Activity     Outcome Measure   Help from another person eating meals?: A Little Help from another person taking care of personal grooming?: A Little Help from another person toileting, which includes using toliet, bedpan, or urinal?: A Lot Help from another person bathing (including washing, rinsing, drying)?: A Lot Help from another person to put on and taking off regular upper body clothing?: A Lot Help from another person to put on and taking off regular lower body clothing?: A Lot 6 Click Score: 14    End of Session Equipment Utilized During Treatment: Gait belt;Rolling walker (2 wheels);Oxygen (2 liters)  OT Visit Diagnosis: Muscle weakness (generalized) (M62.81);Unsteadiness on feet (R26.81)   Activity Tolerance Patient tolerated treatment well   Patient Left in bed;with call bell/phone within reach;with bed alarm set;with nursing/sitter in room   Nurse Communication Mobility status        Time: 2956-2130 OT Time Calculation (min): 33 min  Charges: OT General Charges $OT Visit: 1 Visit OT Treatments $Self Care/Home Management : 23-37 mins  Lodema Hong, [place]  Office [phone]   Neo Yepiz  L Daysi Boggan 06/22/2022, 10:45 AM

## 2022-06-22 NOTE — Plan of Care (Signed)
  Problem: Coping: Goal: Ability to adjust to condition or change in health will improve Outcome: Progressing   Problem: Fluid Volume: Goal: Ability to maintain a balanced intake and output will improve Outcome: Progressing   Problem: Health Behavior/Discharge Planning: Goal: Ability to manage health-related needs will improve Outcome: Progressing   Problem: Clinical Measurements: Goal: Diagnostic test results will improve Outcome: Progressing

## 2022-06-22 NOTE — IPAL (Signed)
Interdisciplinary Goals of Care Family Meeting   Date carried out:: 06/22/2022  Location of the meeting: Phone conference  Member's involved: Physician, Bedside Registered Nurse, and Family Member or next of kin  Durable Power of Attorney or acting medical decision maker: Jeffrey [name]    Discussion: We discussed goals of care for Jeffrey [name] .    The Clinical status was relayed to patient's wife over the phone in detail   Updated and notified of patients medical condition.   Patient remains minimally responsive, not following commands  Patient is having a weak cough and struggling to remove secretions.   Patient with increased WOB and using accessory muscles to breathe Evidence of kidney and liver failure failure   Patient with Progressive multiorgan failure with a very high probablity of a very minimal chance of meaningful recovery despite all aggressive and optimal medical therapy. Patient's wife stated that proceed with endotracheal intubation while keeping him DNR, to see if he improves, if he does not improve over the next few days, then they would like to proceed with comfort care as he would not wanted to live on ventilator for long time  Code status: Full DNR  Disposition: Continue current acute care    Family are satisfied with Plan of action and management. All questions answered   Jeffrey Kindle MD [facility] See Amion for pager If no response to pager, please call [phone] until 7pm After 7pm, Please call E-link [phone]

## 2022-06-22 NOTE — Procedures (Signed)
Intubation Procedure Note  Jeffrey [name]  [id number]  [birth year]  Date:06/22/22  Time:5:10 PM   Provider Performing:Evyn Putzier    Procedure: Intubation (31500)  Indication(s) Respiratory Failure  Consent Risks of the procedure as well as the alternatives and risks of each were explained to the patient and/or caregiver.  Consent for the procedure was obtained and is signed in the bedside chart   Anesthesia Etomidate and Rocuronium   Time Out Verified patient identification, verified procedure, site/side was marked, verified correct patient position, special equipment/implants available, medications/allergies/relevant history reviewed, required imaging and test results available.   Sterile Technique Usual hand hygeine, masks, and gloves were used   Procedure Description Patient positioned in bed supine.  Sedation given as noted above.  Patient was intubated with endotracheal tube using  MAC 4  .  View was Grade 1 full glottis .  Number of attempts was 1.  Colorimetric CO2 detector was consistent with tracheal placement.   Complications/Tolerance None; patient tolerated the procedure well. Chest X-ray is ordered to verify placement.   EBL Minimal   Specimen(s) None

## 2022-06-22 NOTE — Consult Note (Signed)
NAME:  Jeffrey [name], MRN:  [id number], DOB:  [birth year], LOS: 7 ADMISSION DATE:  06/19/2022, CONSULTATION DATE:  06/22/22 REFERRING MD:  Sabino Gasser TRH, CHIEF COMPLAINT:  AMS   History of Present Illness:  77 yo M PMH ESRD on MWF HD, alcoholic cirrhosis, DM2, Chronic hypoxic respiratory failure on 2L, Afib, cirrhosis was admitted 1/15 for acute on chronic hypoxic resp failure, Flu PNA.   On  1/22 he was more lethargic. While in iHD he was started on BiPAP but remained lethargic.  ABG with resp acidosis   PCCM consulted for intubation in this setting   Pertinent  Medical History   Past Medical History:  Diagnosis Date   A-fib [facility])    when initially stating dialysis january 2021 at [facility] per spouse; never heard anything else about it   Alcoholic cirrhosis [facility])    patient reports completing hep a and b vaccines in 2006   Anemia    has had 3 units of prbcs 2013   Anxiety    Arthritis    Asymptomatic gallstones    Ultrasound in 2006   B12 deficiency 11/21/2020   C. difficile colitis    Cholelithiasis    Chronic kidney disease    Dialysis M/W/F/Sa   Depression    Diabetes ([place])    Duodenal ulcer with hemorrhage    per patient in 2006 or 2007, records have been requested   Dyspnea    low oxygen level -  has O2 2 L all day   Esophageal varices ([place])    see PSH   GERD (gastroesophageal reflux disease)    Heart burn    History of alcohol abuse    quit 05/2013   HOH (hard of hearing)    Hypertension    Liver cirrhosis ([place])    ETOH   SBP (spontaneous bacterial peritonitis) ([place])    2020, November 2021.    Splenomegaly    Ultrasound in 2006    Significant Hospital Events: Including procedures, antibiotic start and stop dates in addition to other pertinent events   1/15 admit to [facility] from [facility] for iHD need 1/15 nephro consult, HD  1/16 Flu A pos, tamiflu  1/18 para, no SBP  1/19 steroids added for incr O2 need  1/22 PCCM consulted for AMS hypercarbic resp  failure, SOB. Transfer to ICU for intubation   Interim History / Subjective:    Objective   Blood pressure (!) 134/57, pulse 88, temperature 98.3 F (36.8 C), temperature source Axillary, resp. rate (!) 28, weight 66.3 kg, SpO2 (!) 84 %.    FiO2 (%):  [100 %] 100 %   Intake/Output Summary (Last 24 hours) at 06/22/2022 1611 Last data filed at 06/22/2022 1212 Gross per 24 hour  Intake 240 ml  Output 0 ml  Net 240 ml   Filed Weights   06/17/22 0814 06/17/22 1253 06/19/22 0913  Weight: 74.3 kg 70.2 kg 66.3 kg    Examination: Physical exam: General: Acute on chronically male, on BiPAP HEENT: [place]/AT, eyes anicteric.  Dry mucous membranes Neuro: Awake, confused, not following commands, moving all 4 extremities Chest: Coarse breath sounds, no wheezes or rhonchi Heart: Regular rate and rhythm, no murmurs or gallops Abdomen: Soft, nondistended, bowel sounds present Skin: No rash   Resolved Hospital Problem list     Assessment & Plan:  Acute on chronic respiratory failure with hypoxia and hypercapnia Influenza A PNA Patient uses 2 L oxygen at baseline This afternoon he was found to  be confused and minimally responsive ABG were drawn showed pH of 7.16 with hypercapnia He was placed on BiPAP without much improvement Decision was to proceed with endotracheal intubation and mechanical ventilation He completed Tamiflu Continue lung protective ventilation VAP prevention bundle in place Vent setting was adjusted to clear hypercapnia Continue droplet precautions PAD protocol with Precedex and fentanyl, RASS goal -1  Acute metabolic/hepatic encephalopathy Vent setting was adjusted to clear hypercapnia Continue lactulose Avoid deep sedation  ESRD on HD Nephrology is following, patient will need hemodialysis by tomorrow, she he was supposed to get it today but was stopped due to change in mental condition  Decompensated alcoholic cirrhosis with ascites with esophageal varices and  encephalopathy Patient had paracentesis done on 1/18, which was negative for SBP Continue lactulose, rifaximin, inderol    Hypervolemic hyponatremia Monitor electrolytes   DM2 Continue scale insulin with CBG goal 140-180  Best Practice (right click and "Reselect all SmartList Selections" daily)   Diet/type: NPO DVT prophylaxis: SCD GI prophylaxis: PPI Lines: N/A Foley:  N/A Code Status:  DNR Last date of multidisciplinary goals of care discussion [1/22: With patient's wife, Please see Ipal note]  Labs   CBC: Recent Labs  Lab 06/17/22 0425 06/19/22 0430 06/20/22 0344 06/21/22 0403 06/22/22 0333  WBC 6.3 8.6 5.5 7.8 5.4  NEUTROABS  --  7.9* 5.0 7.5 5.1  HGB 8.4* 8.5* 8.2* 8.2* 7.4*  HCT 26.6* 26.9* 26.4* 25.4* 23.8*  MCV 99.6 99.3 101.5* 100.0 100.8*  PLT 105* 109* 105* 102* 84*    Basic Metabolic Panel: Recent Labs  Lab 06/17/22 0425 06/19/22 0430 06/20/22 0344 06/21/22 0403 06/22/22 0333  NA 132* 133* 131* 131* 131*  K 3.5 3.2* 3.8 4.2 4.3  CL 92* 90* 92* 92* 92*  CO2 '23 30 25 26 25  '$ GLUCOSE 142* 167* 280* 239* 214*  BUN 37* 41* 40* 73* 103*  CREATININE 2.95* 3.30* 2.47* 3.41* 4.13*  CALCIUM 8.3* 8.7* 8.4* 8.9 8.9  MG  --  1.9 1.8 2.0 2.1  PHOS  --  5.2* 3.9 4.4 4.2   GFR: Estimated Creatinine Clearance: 14.4 mL/min (A) (by C-G formula based on SCr of 4.13 mg/dL (H)). Recent Labs  Lab 06/19/22 0430 06/20/22 0344 06/21/22 0403 06/22/22 0333  WBC 8.6 5.5 7.8 5.4    Liver Function Tests: Recent Labs  Lab 06/17/22 0425 06/19/22 0430 06/20/22 0344 06/21/22 0403 06/22/22 0333  AST 35  --   --   --   --   ALT 26  --   --   --   --   ALKPHOS 208*  --   --   --   --   BILITOT 1.0  --   --   --   --   PROT 5.8*  --   --   --   --   ALBUMIN 1.9* 1.9* 1.8* 2.0* 2.0*   No results for input(s): "LIPASE", "AMYLASE" in the last 168 hours. Recent Labs  Lab 06/20/22 0344  AMMONIA 82*    ABG    Component Value Date/Time   PHART 7.16 (LL)  06/22/2022 1302   PCO2ART 69 (HH) 06/22/2022 1302   PO2ART 137 (H) 06/22/2022 1302   HCO3 24.7 06/22/2022 1302   TCO2 27 07/08/2021 1343   ACIDBASEDEF 5.6 (H) 06/22/2022 1302   O2SAT UNABLE TO CALCULATE 06/22/2022 1302     Coagulation Profile: No results for input(s): "INR", "PROTIME" in the last 168 hours.  Cardiac Enzymes: No results for input(s): "  CKTOTAL", "CKMB", "CKMBINDEX", "TROPONINI" in the last 168 hours.  HbA1C: Hgb A1c MFr Bld  Date/Time Value Ref Range Status  04/23/2022 02:40 AM 6.1 (H) 4.8 - 5.6 % Final    Comment:    (NOTE)         Prediabetes: 5.7 - 6.4         Diabetes: >6.4         Glycemic control for adults with diabetes: <7.0   07/29/2021 05:22 AM 5.5 4.8 - 5.6 % Final    Comment:    (NOTE) Pre diabetes:          5.7%-6.4%  Diabetes:              >6.4%  Glycemic control for   <7.0% adults with diabetes     CBG: Recent Labs  Lab 06/21/22 1154 06/21/22 1601 06/21/22 2006 06/22/22 0803 06/22/22 1120  GLUCAP 325* 286* 210* 259* 299*    Review of Systems:   Unable to obtain due to encephalopathy   Past Medical History:  He,  has a past medical history of A-fib ([place]), Alcoholic cirrhosis ([place]), Anemia, Anxiety, Arthritis, Asymptomatic gallstones, B12 deficiency (11/21/2020), C. difficile colitis, Cholelithiasis, Chronic kidney disease, Depression, Diabetes ([place]), Duodenal ulcer with hemorrhage, Dyspnea, Esophageal varices ([place]), GERD (gastroesophageal reflux disease), Heart burn, History of alcohol abuse, HOH (hard of hearing), Hypertension, Liver cirrhosis ([place]), SBP (spontaneous bacterial peritonitis) ([place]), and Splenomegaly.   Surgical History:   Past Surgical History:  Procedure Laterality Date   AGILE CAPSULE N/A 09/20/2019   Procedure: AGILE CAPSULE;  Surgeon: Daneil Dolin, MD;  [facility];  Service: Endoscopy;  Laterality: N/A;   APPENDECTOMY     AV FISTULA PLACEMENT Left 08/17/2019   Procedure: ARTERIOVENOUS (AV)  FISTULA CREATION VERSES ARTERIOVENOUS GRAFT;  Surgeon: [name], MD;  Location: [facility] OR;  Service: Vascular;  Laterality: Left;   BASCILIC VEIN TRANSPOSITION Left 09/28/2019   Procedure: LEFT SECOND STAGE [place];  Surgeon: [name], MD;  Location: [place];  Service: Vascular;  Laterality: Left;   BIOPSY  02/03/2018   Procedure: BIOPSY;  Surgeon: Daneil Dolin, MD;  [facility];  Service: Endoscopy;;  bx of gastric polyps   BIOPSY  09/19/2019   Procedure: BIOPSY;  Surgeon: Danie Binder, MD;  [facility];  Service: Endoscopy;;   BIOPSY  01/16/2021   Procedure: BIOPSY;  Surgeon: Daneil Dolin, MD;  [facility];  Service: Endoscopy;;   CATARACT EXTRACTION Right    COLONOSCOPY  03/10/2005   Rectal polyp as described above, removed with snare. Left sided  diverticula. The remainder of the colonic mucosa appeared normal. Inflammed polyp on path.   COLONOSCOPY  04/1999   Dr. Thea Silversmith polyps removed,  Path showed hyperplastic   COLONOSCOPY  10/2015   Dr. Britta Mccreedy: diverticulosis, single sessile tubular adenoma 3-16m in size removed from descending colon.    COLONOSCOPY WITH ESOPHAGOGASTRODUODENOSCOPY (EGD)  03/31/2012   RONG:[id number]. Colonic diverticulosis. tubular adenoma colon   COLONOSCOPY WITH ESOPHAGOGASTRODUODENOSCOPY (EGD)  06/2019   FORSYTH: small esophageal varices without high risk stigmata, single large AVM on lesser curvature in gastric body s/p APC ablation, gastric antral and duodenal bulb polyposis. No significant source to explain transfusion dependent anemia. Colonoscopy with portal colopathy and diffuse edema, changes of Cdiff colitis on colonoscopy   COLONOSCOPY WITH PROPOFOL N/A 01/19/2021   Surgeon: CDaryel November MD; 15 mm polyp in descending colon removed piecemeal,  6 mm polyp in ascending colon and 12 mm polyp in sigmoid colon not removed due to high bleeding risk, benign polypoid lesion at splenic  flexure, diverticulosis in sigmoid and descending colon, rectal varices.   COLONOSCOPY WITH PROPOFOL N/A 01/16/2021   Surgeon: Daneil Dolin, MD;  Portal colopathy, large rectal varices, polypoid mass at splenic flexure biopsy, two 1 cm left colon polyps not removed, left-sided diverticulosis.  Reported patient had bleeding from splenic flexure lesion.  Pathology with acutely inflamed colonic mucosa with localized ulceration and prominent reactive changes.   ESOPHAGEAL BANDING N/A 02/03/2018   Procedure: ESOPHAGEAL BANDING;  Surgeon: Daneil Dolin, MD;  [facility];  Service: Endoscopy;  Laterality: N/A;   ESOPHAGEAL BANDING  01/16/2021   Procedure: ESOPHAGEAL BANDING;  Surgeon: Daneil Dolin, MD;  [facility];  Service: Endoscopy;;   ESOPHAGOGASTRODUODENOSCOPY  01/15/2005   Three columns grade 1 to 2 esophageal varices, otherwise normal esophageal mucosa.  Esophagus was not manipulated otherwise./Nodularity of the antrum with overlying erosions, nonspecific finding. Path showed rare H.pylori   ESOPHAGOGASTRODUODENOSCOPY  09/2008   Dr. Gaylene Brooks columns of grade 2-3 esoph varices, only one column was prominent. Portal gastropathy, multiple gastrc polyps at antrum, two were 2cm with black eschar, bulbar polyps, bulbar erosions   ESOPHAGOGASTRODUODENOSCOPY  11/2004   Dr. Brantley Stage 3 esoph varices   ESOPHAGOGASTRODUODENOSCOPY  03/31/2012   RMR: 4 columns(3-GR 2, 1-GR1) non-bleeding esophageal varices, portal gastropathy, small HH, early GAVE, multiple gastric polyps    ESOPHAGOGASTRODUODENOSCOPY (EGD) WITH PROPOFOL N/A 02/03/2018   Dr. Gala Romney: Esophageal varices, 3 columns grade 1-2.  Portal hypertensive gastropathy.  Multiple gastric polyps, biopsy consistent with hyperplastic.   ESOPHAGOGASTRODUODENOSCOPY (EGD) WITH PROPOFOL N/A 09/19/2019   Fields: grade I and II esophageal varcies, mild portal hypertensive gastropathy, moderate gastritis but no H. pylori,  single hyperplastic gastric polyp removed, obvious source for melena/transfusion dependent anemia not identified, may be due to friable gastric and duodenal mucosa in the setting of portal hypertension   ESOPHAGOGASTRODUODENOSCOPY (EGD) WITH PROPOFOL N/A 01/16/2021   Surgeon: Daneil Dolin, MD; Portal hypertensive gastropathy, grade 2 esophageal varices with bleeding stigmata s/p banding x4, slight nodularity and congestion involving bulb and second portion of duodenum, nonspecific.  Recommended repeat EGD in 4 weeks.   ESOPHAGOGASTRODUODENOSCOPY (EGD) WITH PROPOFOL N/A 05/22/2021   stigmata of prior esophageal banding and no significant varices seen. Portal gastropathy. Polypoid gastric and duodenal mucosa with overlying erosions, marked mucosal friability.   ESOPHAGOGASTRODUODENOSCOPY (EGD) WITH PROPOFOL N/A 02/06/2022   Procedure: ESOPHAGOGASTRODUODENOSCOPY (EGD) WITH PROPOFOL;  Surgeon: Harvel Quale, MD;  [facility];  Service: Gastroenterology;  Laterality: N/A;   GASTRIC VARICES BANDING  03/31/2012   Procedure: GASTRIC VARICES BANDING;  Surgeon: Daneil Dolin, MD;  [facility];  Service: Endoscopy;  Laterality: N/A;   GIVENS CAPSULE STUDY N/A 09/21/2019   Poor study with a lot of debris obstructing much of the view of the first approximate 3 hours out of 6 hours.  No obvious source of bleeding identified.  Sequela of bleeding and old blood seen in the form of"whisps" of blood, blood flecks mostly toward the end of the study.   HEMOSTASIS CLIP PLACEMENT  01/19/2021   Procedure: HEMOSTASIS CLIP PLACEMENT;  Surgeon: [name], MD;  Location[facility] ENDOSCOPY;  Service: Gastroenterology;;   HEMOSTASIS CLIP PLACEMENT  02/06/2022   Procedure: HEMOSTASIS CLIP PLACEMENT;  Surgeon: Harvel Quale, MD;  [facility];  Service: Gastroenterology;;  gastric body   HOT HEMOSTASIS  02/06/2022   Procedure: HOT HEMOSTASIS (ARGON PLASMA  COAGULATION/BICAP);  Surgeon: Montez Morita, Quillian Quince, MD;  [facility];  Service: Gastroenterology;;   IR PARACENTESIS  06/18/2022   IR REMOVAL TUN CV CATH W/O FL  02/15/2020   POLYPECTOMY  09/19/2019   Procedure: POLYPECTOMY;  Surgeon: Danie Binder, MD;  [facility];  Service: Endoscopy;;   POLYPECTOMY  01/19/2021   Procedure: POLYPECTOMY;  Surgeon: [name], MD;  Location: [facility] ENDOSCOPY;  Service: Gastroenterology;;   UMBILICAL HERNIA REPAIR  2017   exploratory laparotomy, abdominal washout     Social History:   reports that he quit smoking about 36 years ago. His smoking use included cigarettes. He has a 50.00 pack-year smoking history. He has never used smokeless tobacco. He reports that he does not drink alcohol and does not use drugs.   Family History:  His family history includes Breast cancer in his sister; Colon cancer (age of onset: 82) in his father; Diabetes in his sister; Healthy in his daughter and son; Stroke in his mother. There is no history of Lung cancer or Ovarian cancer.   Allergies Allergies  Allergen Reactions   Tape Other (See Comments)    Very thin skin, tears easily     Home Medications  Prior to Admission medications   Medication Sig Start Date End Date Taking? Authorizing Provider  acetaminophen (TYLENOL) 325 MG tablet Take 650 mg by mouth every 6 (six) hours as needed for mild pain, moderate pain, fever or headache.   Yes [provider]  allopurinol (ZYLOPRIM) 300 MG tablet Take 300 mg by mouth in the morning. 12/11/21  Yes [provider]  ALPRAZolam Duanne Moron) 0.5 MG tablet Take 1 tablet (0.5 mg total) by mouth See admin instructions. Take one tablet (0.5 mg) by mouth on Monday, Wednesday, Friday before dialysis, may also take one tablet (0.5 mg) daily as needed for anxiety. 02/07/22  Yes Tat, Shanon Brow, MD  ASCORBIC ACID PO Take 1 tablet by mouth in the morning. Vitamin C, unknown strength.   Yes [provider]  Calcium Carbonate Antacid (TUMS PO) Take 2 tablets by mouth with breakfast, with lunch, and with evening meal.   Yes [provider]  cetirizine (ZYRTEC) 10 MG tablet Take 10 mg by mouth in the morning. 06/24/21  Yes [provider]  Cholecalciferol (VITAMIN D-3 PO) Take 1 capsule by mouth in the morning.   Yes [provider]  diltiazem (DILACOR XR) 120 MG 24 hr capsule Take 120 mg by mouth in the morning. 05/19/22  Yes [provider]  famotidine (PEPCID) 20 MG tablet Take 1 tablet (20 mg total) by mouth 2 (two) times daily. 07/30/21 07/30/22 Yes Barton Dubois, MD  furosemide (LASIX) 40 MG tablet Take 80 mg by mouth in the morning. 12/29/19  Yes [provider]  HUMALOG KWIKPEN 100 UNIT/ML KwikPen Inject 10-50 Units into the skin See admin instructions. Inject 10-50 units three times daily with meals per sliding scale. 04/23/21  Yes [provider]  HYDROcodone-acetaminophen (NORCO/VICODIN) 5-325 MG tablet Take 1 tablet by mouth daily as needed for severe pain.   Yes [provider]  hydrOXYzine (ATARAX/VISTARIL) 25 MG tablet Take 25 mg by mouth 3 (three) times daily. 11/23/20  Yes [provider]  iron polysaccharides (NIFEREX) 150 MG capsule Take 150 mg by mouth at bedtime.   Yes [provider]  Lactobacillus (ACIDOPHILUS PO) Take 1 capsule  by mouth in the morning.   Yes [provider]  lactulose (CHRONULAC) 10 GM/15ML solution Take 15 mLs (10 g total) by mouth 2 (two) times daily. Patient taking differently: Take 10 g by mouth See admin instructions. 10g twice daily on Sunday, Tuesday, Thursday, Saturday 10g in the evening on Monday, Wednesday, Friday after dialysis. 02/07/22  Yes Tat, Shanon Brow, MD  metoprolol succinate (TOPROL-XL) 25 MG 24 hr tablet Take 1 tablet (25 mg total) by mouth daily. Patient taking differently: Take 25 mg by mouth See admin instructions. 25 mg twice daily on Sunday,  Tuesday, Thursday, Saturday. 25 mg at bedtime on Monday, Wednesday, Friday (does not take before dialysis) 02/22/22  Yes Tat, Shanon Brow, MD  mirtazapine (REMERON) 30 MG tablet Take 1 tablet (30 mg total) by mouth at bedtime. 02/10/20  Yes Roxan Hockey, MD  Multiple Vitamin (MULTIVITAMIN WITH MINERALS) TABS tablet Take 1 tablet by mouth in the morning.    Yes [provider]  nitroGLYCERIN (NITROGLYN) 2 % ointment Apply 2 inches topically once as needed for chest pain.   Yes [provider]  omega-3 acid ethyl esters (LOVAZA) 1 g capsule Take 2 g by mouth 2 (two) times daily.  04/07/19  Yes [provider]  ondansetron (ZOFRAN) 4 MG tablet Take 4 mg by mouth every 8 (eight) hours as needed for nausea or vomiting.   Yes [provider]  pantoprazole (PROTONIX) 40 MG tablet Take 1 tablet (40 mg total) by mouth 2 (two) times daily. 02/21/22  Yes Tat, Shanon Brow, MD  pravastatin (PRAVACHOL) 20 MG tablet Take 20 mg by mouth in the morning.    Yes [provider]  rifaximin (XIFAXAN) 550 MG TABS tablet Take 1 tablet (550 mg total) by mouth 2 (two) times daily. 05/02/22  Yes Emokpae, Courage, MD  SSD 1 % cream Apply 2 Applications topically 2 (two) times daily as needed (bed sores). 02/26/22  Yes [provider]  sulfamethoxazole-trimethoprim (BACTRIM DS) 800-160 MG tablet TAKE 1 TABLET BY MOUTH every friday AFTER dialysis Patient taking differently: Take 1 tablet by mouth See admin instructions. 1 tablet every Friday, after dialysis 03/12/22  Yes Annitta Needs, NP  thiamine (VITAMIN B-1) 100 MG tablet Take 100 mg by mouth in the morning. 08/27/20  Yes [provider]  tiZANidine (ZANAFLEX) 2 MG tablet Take 2 mg by mouth at bedtime as needed for muscle spasms. 04/07/22  Yes [provider]  TRESIBA FLEXTOUCH 200 UNIT/ML FlexTouch Pen Inject 10-50 Units into the skin See admin instructions. Inject 10-50 units once daily at bedtime as needed for high  blood sugar levels, per sliding scale. 02/17/22  Yes [provider]  triamcinolone cream (KENALOG) 0.1 % Apply 1 application  topically 2 (two) times daily as needed (dry skin, itching). 12/30/17  Yes [provider]  venlafaxine (EFFEXOR) 75 MG tablet Take 75 mg by mouth 3 (three) times daily with meals.    Yes [provider]  zinc sulfate 220 (50 Zn) MG capsule Take 1 capsule (220 mg total) by mouth daily. Patient taking differently: Take 220 mg by mouth in the morning. 05/03/22  Yes Roxan Hockey, MD     Critical care time:      This patient is critically ill with multiple organ system failure which requires frequent high complexity decision making, assessment, support, evaluation, and titration of therapies. This was completed through the application of advanced monitoring technologies and extensive interpretation of multiple databases.  During this encounter critical  care time was devoted to patient care services described in this note for 44 minutes.    Jacky Kindle, MD [facility] See Amion for pager If no response to pager, please call [phone] until 7pm After 7pm, Please call E-link [phone]

## 2022-06-23 ENCOUNTER — Inpatient Hospital Stay (HOSPITAL_COMMUNITY): Payer: Medicare Other

## 2022-06-23 DIAGNOSIS — E43 Unspecified severe protein-calorie malnutrition: Secondary | ICD-10-CM | POA: Insufficient documentation

## 2022-06-23 DIAGNOSIS — J9621 Acute and chronic respiratory failure with hypoxia: Secondary | ICD-10-CM | POA: Diagnosis not present

## 2022-06-23 LAB — POCT I-STAT EG7
Acid-Base Excess: 1 mmol/L (ref 0.0–2.0)
Bicarbonate: 27.1 mmol/L (ref 20.0–28.0)
Calcium, Ion: 1.24 mmol/L (ref 1.15–1.40)
HCT: 22 % — ABNORMAL LOW (ref 39.0–52.0)
Hemoglobin: 7.5 g/dL — ABNORMAL LOW (ref 13.0–17.0)
O2 Saturation: 31 %
Patient temperature: 98.4
Potassium: 4.5 mmol/L (ref 3.5–5.1)
Sodium: 130 mmol/L — ABNORMAL LOW (ref 135–145)
TCO2: 29 mmol/L (ref 22–32)
pCO2, Ven: 52.8 mmHg (ref 44–60)
pH, Ven: 7.318 (ref 7.25–7.43)
pO2, Ven: 21 mmHg — CL (ref 32–45)

## 2022-06-23 LAB — RENAL FUNCTION PANEL
Albumin: 2 g/dL — ABNORMAL LOW (ref 3.5–5.0)
Anion gap: 14 (ref 5–15)
BUN: 114 mg/dL — ABNORMAL HIGH (ref 8–23)
CO2: 24 mmol/L (ref 22–32)
Calcium: 9 mg/dL (ref 8.9–10.3)
Chloride: 90 mmol/L — ABNORMAL LOW (ref 98–111)
Creatinine, Ser: 4.53 mg/dL — ABNORMAL HIGH (ref 0.61–1.24)
GFR, Estimated: 13 mL/min — ABNORMAL LOW (ref >60)
Glucose, Bld: 209 mg/dL — ABNORMAL HIGH (ref 70–99)
Phosphorus: 2.5 mg/dL (ref 2.5–4.6)
Potassium: 4.3 mmol/L (ref 3.5–5.1)
Sodium: 128 mmol/L — ABNORMAL LOW (ref 135–145)

## 2022-06-23 LAB — MAGNESIUM
Magnesium: 2.1 mg/dL (ref 1.7–2.4)
Magnesium: 1.7 mg/dL (ref 1.7–2.4)

## 2022-06-23 LAB — GLUCOSE, CAPILLARY
Glucose-Capillary: 196 mg/dL — ABNORMAL HIGH (ref 70–99)
Glucose-Capillary: 192 mg/dL — ABNORMAL HIGH (ref 70–99)
Glucose-Capillary: 169 mg/dL — ABNORMAL HIGH (ref 70–99)
Glucose-Capillary: 200 mg/dL — ABNORMAL HIGH (ref 70–99)
Glucose-Capillary: 161 mg/dL — ABNORMAL HIGH (ref 70–99)
Glucose-Capillary: 158 mg/dL — ABNORMAL HIGH (ref 70–99)
Glucose-Capillary: 168 mg/dL — ABNORMAL HIGH (ref 70–99)

## 2022-06-23 LAB — CBC WITH DIFFERENTIAL/PLATELET
Abs Immature Granulocytes: 0.1 10*3/uL — ABNORMAL HIGH (ref 0.00–0.07)
Basophils Absolute: 0 10*3/uL (ref 0.0–0.1)
Basophils Relative: 0 %
Eosinophils Absolute: 0 10*3/uL (ref 0.0–0.5)
Eosinophils Relative: 0 %
HCT: 21.9 % — ABNORMAL LOW (ref 39.0–52.0)
Hemoglobin: 7 g/dL — ABNORMAL LOW (ref 13.0–17.0)
Immature Granulocytes: 1 %
Lymphocytes Relative: 1 %
Lymphs Abs: 0.1 10*3/uL — ABNORMAL LOW (ref 0.7–4.0)
MCH: 31 pg (ref 26.0–34.0)
MCHC: 32 g/dL (ref 30.0–36.0)
MCV: 96.9 fL (ref 80.0–100.0)
Monocytes Absolute: 0.4 10*3/uL (ref 0.1–1.0)
Monocytes Relative: 2 %
Neutro Abs: 18.2 10*3/uL — ABNORMAL HIGH (ref 1.7–7.7)
Neutrophils Relative %: 96 %
Platelets: 130 10*3/uL — ABNORMAL LOW (ref 150–400)
RBC: 2.26 MIL/uL — ABNORMAL LOW (ref 4.22–5.81)
RDW: 18.5 % — ABNORMAL HIGH (ref 11.5–15.5)
WBC: 18.8 10*3/uL — ABNORMAL HIGH (ref 4.0–10.5)
nRBC: 0.4 % — ABNORMAL HIGH (ref 0.0–0.2)

## 2022-06-23 LAB — AMMONIA: Ammonia: 91 umol/L — ABNORMAL HIGH (ref 9–35)

## 2022-06-23 LAB — PHOSPHORUS: Phosphorus: 1.7 mg/dL — ABNORMAL LOW (ref 2.5–4.6)

## 2022-06-23 MED ORDER — AMIODARONE HCL IN DEXTROSE 360-4.14 MG/200ML-% IV SOLN
60.0000 mg/h | INTRAVENOUS | Status: AC
  Administered 2022-06-23 (×2): 60 mg/h via INTRAVENOUS
  Filled 2022-06-23 (×2): qty 200

## 2022-06-23 MED ORDER — NOREPINEPHRINE 4 MG/250ML-% IV SOLN
0.0000 ug/min | INTRAVENOUS | Status: DC
  Administered 2022-06-23: 7 ug/min via INTRAVENOUS
  Administered 2022-06-23: 10 ug/min via INTRAVENOUS
  Administered 2022-06-23: 12 ug/min via INTRAVENOUS
  Administered 2022-06-24: 4 ug/min via INTRAVENOUS
  Filled 2022-06-23 (×3): qty 250

## 2022-06-23 MED ORDER — ALBUMIN HUMAN 25 % IV SOLN
25.0000 g | INTRAVENOUS | Status: AC
  Administered 2022-06-23 (×2): 25 g via INTRAVENOUS
  Filled 2022-06-23 (×2): qty 100

## 2022-06-23 MED ORDER — INSULIN ASPART 100 UNIT/ML IJ SOLN
2.0000 [IU] | INTRAMUSCULAR | Status: DC
  Administered 2022-06-23 – 2022-06-25 (×4): 2 [IU] via SUBCUTANEOUS

## 2022-06-23 MED ORDER — ALPRAZOLAM 0.5 MG PO TABS: 0.5000 mg | ORAL_TABLET | Freq: Every day | ORAL | Status: DC | PRN

## 2022-06-23 MED ORDER — AMIODARONE HCL IN DEXTROSE 360-4.14 MG/200ML-% IV SOLN
30.0000 mg/h | INTRAVENOUS | Status: DC
  Administered 2022-06-23 – 2022-06-24 (×2): 30 mg/h via INTRAVENOUS
  Filled 2022-06-23 (×2): qty 200

## 2022-06-23 MED ORDER — PROSOURCE TF20 ENFIT COMPATIBL EN LIQD
60.0000 mL | Freq: Every day | ENTERAL | Status: DC
  Administered 2022-06-23 – 2022-06-30 (×9): 60 mL
  Filled 2022-06-23 (×8): qty 60

## 2022-06-23 MED ORDER — FAMOTIDINE 20 MG PO TABS
10.0000 mg | ORAL_TABLET | Freq: Every day | ORAL | Status: DC
  Administered 2022-06-23: 10 mg via ORAL
  Filled 2022-06-23: qty 1

## 2022-06-23 MED ORDER — ORAL CARE MOUTH RINSE
15.0000 mL | OROMUCOSAL | Status: DC
  Administered 2022-06-23 – 2022-06-28 (×43): 15 mL via OROMUCOSAL

## 2022-06-23 MED ORDER — MIDODRINE HCL 5 MG PO TABS
10.0000 mg | ORAL_TABLET | Freq: Three times a day (TID) | ORAL | Status: DC
  Administered 2022-06-23 – 2022-06-24 (×3): 10 mg via ORAL
  Filled 2022-06-23 (×3): qty 2

## 2022-06-23 MED ORDER — VITAL 1.5 CAL PO LIQD
1000.0000 mL | ORAL | Status: DC
  Administered 2022-06-23 – 2022-06-26 (×4): 1000 mL

## 2022-06-23 MED ORDER — THIAMINE MONONITRATE 100 MG PO TABS
100.0000 mg | ORAL_TABLET | Freq: Every day | ORAL | Status: DC
  Administered 2022-06-23 – 2022-06-25 (×3): 100 mg
  Filled 2022-06-23 (×3): qty 1

## 2022-06-23 MED ORDER — ORAL CARE MOUTH RINSE: 15.0000 mL | OROMUCOSAL | Status: DC | PRN

## 2022-06-23 MED ORDER — DARBEPOETIN ALFA 200 MCG/0.4ML IJ SOSY
200.0000 ug | PREFILLED_SYRINGE | Freq: Once | INTRAMUSCULAR | Status: AC
  Administered 2022-06-23: 200 ug via SUBCUTANEOUS
  Filled 2022-06-23: qty 0.4

## 2022-06-23 MED ORDER — AMIODARONE LOAD VIA INFUSION
150.0000 mg | Freq: Once | INTRAVENOUS | Status: AC
  Administered 2022-06-23: 150 mg via INTRAVENOUS
  Filled 2022-06-23: qty 83.34

## 2022-06-23 MED ORDER — LACTULOSE 10 GM/15ML PO SOLN
30.0000 g | Freq: Three times a day (TID) | ORAL | Status: DC
  Administered 2022-06-23 – 2022-06-25 (×6): 30 g
  Filled 2022-06-23 (×6): qty 45

## 2022-06-23 MED ORDER — RENA-VITE PO TABS
1.0000 | ORAL_TABLET | Freq: Every day | ORAL | Status: DC
  Administered 2022-06-23 – 2022-06-24 (×2): 1
  Filled 2022-06-23 (×2): qty 1

## 2022-06-23 NOTE — Progress Notes (Signed)
[facility] Progress Note   Subjective:  Had desat event yesterday and placed on BiPAP briefly during HD. Remained altered then hypotensive. ABG showing resp acidosis. Was transferred to ICU and intubated.   Seen in ICU. On Levophed, prededex. FiO2 40%. Family at bedside.    Objective Vitals:   06/23/22 0745 06/23/22 0800 06/23/22 0815 06/23/22 0830  BP: (!) 133/48 (!) 124/57 (!) 134/45 (!) 121/39  Pulse: 76 81 75 75  Resp: '15 20 16 '$ (!) 27  Temp:      TempSrc:      SpO2: 98% 95% 97% 95%  Weight:      Height:       Physical Exam General: chronically ill appearing HEENT: orraly intubated  Heart: RRR  Lungs: BS decreased,  Abdomen: soft, NT Extremities: no LE edema Dialysis Access: LU AVF +b/t   Filed Weights   06/17/22 0814 06/17/22 1253 06/19/22 0913  Weight: 74.3 kg 70.2 kg 66.3 kg    Intake/Output Summary (Last 24 hours) at 06/23/2022 0910 Last data filed at 06/23/2022 0600 Gross per 24 hour  Intake 331.21 ml  Output 10 ml  Net 321.21 ml     Additional Objective Labs: Basic Metabolic Panel: Recent Labs  Lab 06/21/22 0403 06/22/22 0333 06/22/22 1808  NA 131* 131* 132*  K 4.2 4.3 3.9  CL 92* 92* 92*  CO2 '26 25 26  '$ GLUCOSE 239* 214* 193*  BUN 73* 103* 99*  CREATININE 3.41* 4.13* 4.06*  CALCIUM 8.9 8.9 9.0  PHOS 4.4 4.2 4.4    Liver Function Tests: Recent Labs  Lab 06/17/22 0425 06/19/22 0430 06/21/22 0403 06/22/22 0333 06/22/22 1808  AST 35  --   --   --   --   ALT 26  --   --   --   --   ALKPHOS 208*  --   --   --   --   BILITOT 1.0  --   --   --   --   PROT 5.8*  --   --   --   --   ALBUMIN 1.9*   < > 2.0* 2.0* 2.2*   < > = values in this interval not displayed.    CBC: Recent Labs  Lab 06/19/22 0430 06/20/22 0344 06/21/22 0403 06/22/22 0333 06/22/22 1808  WBC 8.6 5.5 7.8 5.4 14.5*  NEUTROABS 7.9* 5.0 7.5 5.1  --   HGB 8.5* 8.2* 8.2* 7.4* 8.1*  HCT 26.9* 26.4* 25.4* 23.8* 25.6*  MCV 99.3 101.5* 100.0 100.8* 98.5   PLT 109* 105* 102* 84* 124*    Blood Culture    Component Value Date/Time   SDES ABDOMEN PERITONEAL 06/18/2022 1302   SPECREQUEST NONE 06/18/2022 1302   CULT  06/18/2022 1302    NO GROWTH 3 DAYS Performed at [facility], [place] [address], [place], [place] [address]    REPTSTATUS 06/22/2022 FINAL 06/18/2022 1302    CBG: Recent Labs  Lab 06/22/22 1714 06/22/22 1923 06/22/22 2342 06/23/22 0209 06/23/22 0727  GLUCAP 159* 157* 221* 196* 192*     Medications:  sodium chloride Stopped (06/23/22 0834)   anticoagulant sodium citrate     dexmedetomidine (PRECEDEX) IV infusion 1 mcg/kg/hr (06/23/22 0600)   norepinephrine (LEVOPHED) Adult infusion 5 mcg/min (06/23/22 0600)    allopurinol  100 mg Per Tube Q M,W,F-2000   Chlorhexidine Gluconate Cloth  6 each Topical Q0600   darbepoetin (ARANESP) injection - DIALYSIS  200 mcg Subcutaneous Q Mon-1800  docusate  100 mg Per Tube BID   heparin  5,000 Units Subcutaneous Q8H   insulin aspart  0-20 Units Subcutaneous Q4H   iron polysaccharides  150 mg Per Tube Daily   lactulose  30 g Per Tube TID   methylPREDNISolone (SOLU-MEDROL) injection  40 mg Intravenous BID   mirtazapine  30 mg Per Tube QHS   mouth rinse  15 mL Mouth Rinse Q2H   polyethylene glycol  17 g Per Tube Daily   pravastatin  20 mg Per Tube q AM   rifaximin  550 mg Per Tube BID   [START ON 06/26/2022] sulfamethoxazole-trimethoprim  1 tablet Per Tube Weekly   venlafaxine  75 mg Per Tube TID WC    Dialysis Orders: [facility] MWF 4:00 EDW 80.5kg  350/500 2K/2.5Ca AVF No heparin Mircera 200 (due 1/15)    Assessment/Plan: Dyspnea/respiratory distress -secondary to volume overload/Influenza A +--initially requiring BiPAP on admit. Improved some with HD but still with increased O2 requirement. 2L [facility] at baseline. CXR 1/19 with interstitial edema, unable to complete extra HD Saturday.  Respiratory decompensation on 1/22. Required BiPAP -->intubation + pressor support.  Per PCCM Encephalopathy - metabolic/hepatic encephalopathy.  Lactulose/rfaximin per primary.  ESRD -  HD MWF. Incomplete HD on 1/22 d/t respiratory decompensation/AMS  Will re attempt HD today at bedside.  Hypertension/volume  - On pressor support. Continue UF as tolerated.  Anemia  - Hb trend down s/p transfusion on 1/11. Aranesp 200 q Monday (did not get 1/22 -will reorder)  Metabolic bone disease -  Ca and phos in goal. No VDRA as outpatient  Nutrition - Renal diet with fluid restriction.  Hx of cirrhosis with ascites - per primary team.   Had paracentesis with 2.3L off 1/19.   Flu A + - completed course of Tamiflu  Dispo: pending  Lynnda Child PA-C [facility] 06/23/2022,9:10 AM

## 2022-06-23 NOTE — Consult Note (Addendum)
NAME:  Jeffrey [name], MRN:  [id number], DOB:  12-02-1945, LOS: 8 ADMISSION DATE:  06/10/2022, CONSULTATION DATE:  06/22/22 REFERRING MD:  Sabino Gasser TRH, CHIEF COMPLAINT:  AMS   History of Present Illness:  77 yo M PMH ESRD on MWF HD, alcoholic cirrhosis, DM2, Chronic hypoxic respiratory failure on 2L, Afib, cirrhosis was admitted 1/15 for acute on chronic hypoxic resp failure, Flu PNA.   On  1/22 he was more lethargic. While in iHD he was started on BiPAP but remained lethargic.  ABG with resp acidosis   PCCM consulted for intubation in this setting   Pertinent  Medical History   Past Medical History:  Diagnosis Date   A-fib [facility])    when initially stating dialysis january 2021 at [facility] per spouse; never heard anything else about it   Alcoholic cirrhosis [facility])    patient reports completing hep a and b vaccines in 2006   Anemia    has had 3 units of prbcs 2013   Anxiety    Arthritis    Asymptomatic gallstones    Ultrasound in 2006   B12 deficiency 11/21/2020   C. difficile colitis    Cholelithiasis    Chronic kidney disease    Dialysis M/W/F/Sa   Depression    Diabetes ([place])    Duodenal ulcer with hemorrhage    per patient in 2006 or 2007, records have been requested   Dyspnea    low oxygen level -  has O2 2 L all day   Esophageal varices ([place])    see PSH   GERD (gastroesophageal reflux disease)    Heart burn    History of alcohol abuse    quit 05/2013   HOH (hard of hearing)    Hypertension    Liver cirrhosis ([place])    ETOH   SBP (spontaneous bacterial peritonitis) ([place])    2020, November 2021.    Splenomegaly    Ultrasound in 2006    Significant Hospital Events: Including procedures, antibiotic start and stop dates in addition to other pertinent events   1/15 admit to [facility] from [facility] for iHD need 1/15 nephro consult, HD  1/16 Flu A pos, tamiflu  1/18 para, no SBP  1/19 steroids added for incr O2 need  1/22 PCCM consulted for AMS hypercarbic resp  failure, SOB. Transfer to ICU for intubation   Interim History / Subjective:  Patient seen at bedside with son.  Still minimally responsive.  Can follow simple command.  Objective   Blood pressure (!) 132/48, pulse 70, temperature 98.4 F (36.9 C), temperature source Axillary, resp. rate (!) 30, height '5\' 7"'$  (1.702 m), weight 66.3 kg, SpO2 100 %.    Vent Mode: PRVC FiO2 (%):  [40 %-100 %] 40 % Set Rate:  [30 bmp] 30 bmp Vt Set:  [440 mL-460 mL] 440 mL PEEP:  [5 cmH20-8 cmH20] 5 cmH20 Plateau Pressure:  [26 cmH20-40 cmH20] 26 cmH20   Intake/Output Summary (Last 24 hours) at 06/23/2022 0809 Last data filed at 06/23/2022 0600 Gross per 24 hour  Intake 451.21 ml  Output 10 ml  Net 441.21 ml    Filed Weights   06/17/22 0814 06/17/22 1253 06/19/22 0913  Weight: 74.3 kg 70.2 kg 66.3 kg    Examination: Physical exam: General: Chronically male, cachetic HEENT: Dry mucous membranes.  ETT tube in place Neuro: Somnolent, minimally responsive to verbal stimulation. Chest: Coarse breath sounds, no wheezes or rhonchi Heart: Regular rate and rhythm, no murmurs or gallops  Abdomen: Soft, nondistended, bowel sounds present Skin: No rash   Resolved Hospital Problem list     Assessment & Plan:  Acute on chronic respiratory failure with hypoxia and hypercapnia Influenza A PNA Patient uses 2 L oxygen at baseline This event is likely due to hypervolemia from missing 2 HD sections.  Chest x-ray shows pulmonary edema without any obvious consolidation.  Hypercarbia improved on VBG. -HD today -No indication for antibiotic or steroid -Continue weaning daily.  -VAP bundle  Acute metabolic encephalopathy Multifactorial from uremia, hypercarbia and hepatic encephalopathy. -Patient is getting HD today for his uremia -Ventilator support for his hypercarbia -Continue lactulose and rifaximin for hepatic encephalopathy.  -Wean Precedex if able  Shock of multifactorial causes Secondary to IV  sedation in the setting of cirrhosis and ESRD. -Added midodrine 10 mg 3 times daily -Continue Levophed.  Wean as able -Added 50 g of albumin  A-fib with RVR Patient has history of A-fib and on metoprolol at home. His RVR could be triggered by holding metoprolol in the setting of infection and acute illness. -Start amiodarone infusion given his hypotension -He is not on anticoagulation at home.  Not a candidate for DOAC given his ESRD.  Warfarin may be his only choice  ESRD on HD -Nephrology is following.  Patient will need HD today  Decompensated alcoholic cirrhosis with ascites with esophageal varices and encephalopathy Patient had paracentesis done on 1/18, which was negative for SBP Continue lactulose, rifaximin, inderol    Hypervolemic hyponatremia Monitor electrolytes  DM2 Continue scale insulin with CBG goal 140-180 -Starting tube feed today  Best Practice (right click and "Reselect all SmartList Selections" daily)   Diet/type: NPO DVT prophylaxis: proph heparin GI prophylaxis: IV pepcid Lines: RIJ  Foley:  remove Code Status:  DNR Last date of multidisciplinary goals of care discussion [1/23}  Labs   CBC: Recent Labs  Lab 06/19/22 0430 06/20/22 0344 06/21/22 0403 06/22/22 0333 06/22/22 1808  WBC 8.6 5.5 7.8 5.4 14.5*  NEUTROABS 7.9* 5.0 7.5 5.1  --   HGB 8.5* 8.2* 8.2* 7.4* 8.1*  HCT 26.9* 26.4* 25.4* 23.8* 25.6*  MCV 99.3 101.5* 100.0 100.8* 98.5  PLT 109* 105* 102* 84* 124*     Basic Metabolic Panel: Recent Labs  Lab 06/19/22 0430 06/20/22 0344 06/21/22 0403 06/22/22 0333 06/22/22 1808  NA 133* 131* 131* 131* 132*  K 3.2* 3.8 4.2 4.3 3.9  CL 90* 92* 92* 92* 92*  CO2 '30 25 26 25 26  '$ GLUCOSE 167* 280* 239* 214* 193*  BUN 41* 40* 73* 103* 99*  CREATININE 3.30* 2.47* 3.41* 4.13* 4.06*  CALCIUM 8.7* 8.4* 8.9 8.9 9.0  MG 1.9 1.8 2.0 2.1  --   PHOS 5.2* 3.9 4.4 4.2 4.4    GFR: Estimated Creatinine Clearance: 14.7 mL/min (A) (by C-G formula  based on SCr of 4.06 mg/dL (H)). Recent Labs  Lab 06/20/22 0344 06/21/22 0403 06/22/22 0333 06/22/22 1808  WBC 5.5 7.8 5.4 14.5*     Liver Function Tests: Recent Labs  Lab 06/17/22 0425 06/19/22 0430 06/20/22 0344 06/21/22 0403 06/22/22 0333 06/22/22 1808  AST 35  --   --   --   --   --   ALT 26  --   --   --   --   --   ALKPHOS 208*  --   --   --   --   --   BILITOT 1.0  --   --   --   --   --  PROT 5.8*  --   --   --   --   --   ALBUMIN 1.9* 1.9* 1.8* 2.0* 2.0* 2.2*    No results for input(s): "LIPASE", "AMYLASE" in the last 168 hours. Recent Labs  Lab 06/20/22 0344  AMMONIA 82*     ABG    Component Value Date/Time   PHART 7.28 (L) 06/22/2022 1810   PCO2ART 61 (H) 06/22/2022 1810   PO2ART 282 (H) 06/22/2022 1810   HCO3 28.7 (H) 06/22/2022 1810   TCO2 27 07/08/2021 1343   ACIDBASEDEF 5.6 (H) 06/22/2022 1302   O2SAT UNABLE TO CALCULATE 06/22/2022 1810     Coagulation Profile: No results for input(s): "INR", "PROTIME" in the last 168 hours.  Cardiac Enzymes: No results for input(s): "CKTOTAL", "CKMB", "CKMBINDEX", "TROPONINI" in the last 168 hours.  HbA1C: Hgb A1c MFr Bld  Date/Time Value Ref Range Status  04/23/2022 02:40 AM 6.1 (H) 4.8 - 5.6 % Final    Comment:    (NOTE)         Prediabetes: 5.7 - 6.4         Diabetes: >6.4         Glycemic control for adults with diabetes: <7.0   07/29/2021 05:22 AM 5.5 4.8 - 5.6 % Final    Comment:    (NOTE) Pre diabetes:          5.7%-6.4%  Diabetes:              >6.4%  Glycemic control for   <7.0% adults with diabetes     CBG: Recent Labs  Lab 06/22/22 1714 06/22/22 1923 06/22/22 2342 06/23/22 0209 06/23/22 0727  GLUCAP 159* 157* 221* 196* 192*     Review of Systems:   Unable to obtain due to encephalopathy   Past Medical History:  He,  has a past medical history of A-fib ([place]), Alcoholic cirrhosis ([place]), Anemia, Anxiety, Arthritis, Asymptomatic gallstones, B12 deficiency (11/21/2020),  C. difficile colitis, Cholelithiasis, Chronic kidney disease, Depression, Diabetes ([place]), Duodenal ulcer with hemorrhage, Dyspnea, Esophageal varices ([place]), GERD (gastroesophageal reflux disease), Heart burn, History of alcohol abuse, HOH (hard of hearing), Hypertension, Liver cirrhosis ([place]), SBP (spontaneous bacterial peritonitis) ([place]), and Splenomegaly.   Surgical History:   Past Surgical History:  Procedure Laterality Date   AGILE CAPSULE N/A 09/20/2019   Procedure: AGILE CAPSULE;  Surgeon: Daneil Dolin, MD;  [facility];  Service: Endoscopy;  Laterality: N/A;   APPENDECTOMY     AV FISTULA PLACEMENT Left 08/17/2019   Procedure: ARTERIOVENOUS (AV) FISTULA CREATION VERSES ARTERIOVENOUS GRAFT;  Surgeon: [name], MD;  Location: [facility];  Service: Vascular;  Laterality: Left;   BASCILIC VEIN TRANSPOSITION Left 09/28/2019   Procedure: LEFT SECOND STAGE [place];  Surgeon: [name], MD;  Location: [place];  Service: Vascular;  Laterality: Left;   BIOPSY  02/03/2018   Procedure: BIOPSY;  Surgeon: Daneil Dolin, MD;  [facility];  Service: Endoscopy;;  bx of gastric polyps   BIOPSY  09/19/2019   Procedure: BIOPSY;  Surgeon: Danie Binder, MD;  [facility];  Service: Endoscopy;;   BIOPSY  01/16/2021   Procedure: BIOPSY;  Surgeon: Daneil Dolin, MD;  [facility];  Service: Endoscopy;;   CATARACT EXTRACTION Right    COLONOSCOPY  03/10/2005   Rectal polyp as described above, removed with snare. Left sided  diverticula. The remainder of the colonic mucosa appeared normal. Inflammed polyp on path.  COLONOSCOPY  04/1999   Dr. Thea Silversmith polyps removed,  Path showed hyperplastic   COLONOSCOPY  10/2015   Dr. Britta Mccreedy: diverticulosis, single sessile tubular adenoma 3-28m in size removed from descending colon.    COLONOSCOPY WITH ESOPHAGOGASTRODUODENOSCOPY (EGD)  03/31/2012   RZOX:[id number]. Colonic diverticulosis.  tubular adenoma colon   COLONOSCOPY WITH ESOPHAGOGASTRODUODENOSCOPY (EGD)  06/2019   FORSYTH: small esophageal varices without high risk stigmata, single large AVM on lesser curvature in gastric body s/p APC ablation, gastric antral and duodenal bulb polyposis. No significant source to explain transfusion dependent anemia. Colonoscopy with portal colopathy and diffuse edema, changes of Cdiff colitis on colonoscopy   COLONOSCOPY WITH PROPOFOL N/A 01/19/2021   Surgeon: CDaryel November MD; 15 mm polyp in descending colon removed piecemeal, 6 mm polyp in ascending colon and 12 mm polyp in sigmoid colon not removed due to high bleeding risk, benign polypoid lesion at splenic flexure, diverticulosis in sigmoid and descending colon, rectal varices.   COLONOSCOPY WITH PROPOFOL N/A 01/16/2021   Surgeon: RDaneil Dolin MD;  Portal colopathy, large rectal varices, polypoid mass at splenic flexure biopsy, two 1 cm left colon polyps not removed, left-sided diverticulosis.  Reported patient had bleeding from splenic flexure lesion.  Pathology with acutely inflamed colonic mucosa with localized ulceration and prominent reactive changes.   ESOPHAGEAL BANDING N/A 02/03/2018   Procedure: ESOPHAGEAL BANDING;  Surgeon: RDaneil Dolin MD;  [facility];  Service: Endoscopy;  Laterality: N/A;   ESOPHAGEAL BANDING  01/16/2021   Procedure: ESOPHAGEAL BANDING;  Surgeon: RDaneil Dolin MD;  [facility];  Service: Endoscopy;;   ESOPHAGOGASTRODUODENOSCOPY  01/15/2005   Three columns grade 1 to 2 esophageal varices, otherwise normal esophageal mucosa.  Esophagus was not manipulated otherwise./Nodularity of the antrum with overlying erosions, nonspecific finding. Path showed rare H.pylori   ESOPHAGOGASTRODUODENOSCOPY  09/2008   Dr. RGaylene Brookscolumns of grade 2-3 esoph varices, only one column was prominent. Portal gastropathy, multiple gastrc polyps at antrum, two were 2cm with black eschar,  bulbar polyps, bulbar erosions   ESOPHAGOGASTRODUODENOSCOPY  11/2004   Dr. FBrantley Stage3 esoph varices   ESOPHAGOGASTRODUODENOSCOPY  03/31/2012   RMR: 4 columns(3-GR 2, 1-GR1) non-bleeding esophageal varices, portal gastropathy, small HH, early GAVE, multiple gastric polyps    ESOPHAGOGASTRODUODENOSCOPY (EGD) WITH PROPOFOL N/A 02/03/2018   Dr. RGala Romney Esophageal varices, 3 columns grade 1-2.  Portal hypertensive gastropathy.  Multiple gastric polyps, biopsy consistent with hyperplastic.   ESOPHAGOGASTRODUODENOSCOPY (EGD) WITH PROPOFOL N/A 09/19/2019   Fields: grade I and II esophageal varcies, mild portal hypertensive gastropathy, moderate gastritis but no H. pylori, single hyperplastic gastric polyp removed, obvious source for melena/transfusion dependent anemia not identified, may be due to friable gastric and duodenal mucosa in the setting of portal hypertension   ESOPHAGOGASTRODUODENOSCOPY (EGD) WITH PROPOFOL N/A 01/16/2021   Surgeon: RDaneil Dolin MD; Portal hypertensive gastropathy, grade 2 esophageal varices with bleeding stigmata s/p banding x4, slight nodularity and congestion involving bulb and second portion of duodenum, nonspecific.  Recommended repeat EGD in 4 weeks.   ESOPHAGOGASTRODUODENOSCOPY (EGD) WITH PROPOFOL N/A 05/22/2021   stigmata of prior esophageal banding and no significant varices seen. Portal gastropathy. Polypoid gastric and duodenal mucosa with overlying erosions, marked mucosal friability.   ESOPHAGOGASTRODUODENOSCOPY (EGD) WITH PROPOFOL N/A 02/06/2022   Procedure: ESOPHAGOGASTRODUODENOSCOPY (EGD) WITH PROPOFOL;  Surgeon: CHarvel Quale MD;  [facility];  Service: Gastroenterology;  Laterality: N/A;   GASTRIC VARICES BANDING  03/31/2012   Procedure: GASTRIC VARICES BANDING;  Surgeon: Daneil Dolin, MD;  [facility];  Service: Endoscopy;  Laterality: N/A;   GIVENS CAPSULE STUDY N/A 09/21/2019   Poor study with a lot of debris  obstructing much of the view of the first approximate 3 hours out of 6 hours.  No obvious source of bleeding identified.  Sequela of bleeding and old blood seen in the form of"whisps" of blood, blood flecks mostly toward the end of the study.   HEMOSTASIS CLIP PLACEMENT  01/19/2021   Procedure: HEMOSTASIS CLIP PLACEMENT;  Surgeon: [name], MD;  Location[facility] END[facility]COPY;  Service: Gastroenterology;;   HEMOSTASIS CLIP PLACEMENT  02/06/2022   Procedure: HEMOSTASIS CLIP PLACEMENT;  Surgeon: Montez Morita, Quillian Quince, MD;  [facility];  Service: Gastroenterology;;  gastric body   HOT HEMOSTASIS  02/06/2022   Procedure: HOT HEMOSTASIS (ARGON PLASMA COAGULATION/BICAP);  Surgeon: Montez Morita, Quillian Quince, MD;  [facility];  Service: Gastroenterology;;   IR PARACENTESIS  06/18/2022   IR REMOVAL TUN CV CATH W/O FL  02/15/2020   POLYPECTOMY  09/19/2019   Procedure: POLYPECTOMY;  Surgeon: Danie Binder, MD;  [facility];  Service: Endoscopy;;   POLYPECTOMY  01/19/2021   Procedure: POLYPECTOMY;  Surgeon: [name], MD;  Location: [facility] ENDOSCOPY;  Service: Gastroenterology;;   UMBILICAL HERNIA REPAIR  2017   exploratory laparotomy, abdominal washout     Social History:   reports that he quit smoking about 36 years ago. His smoking use included cigarettes. He has a 50.00 pack-year smoking history. He has never used smokeless tobacco. He reports that he does not drink alcohol and does not use drugs.   Family History:  His family history includes Breast cancer in his sister; Colon cancer (age of onset: 3) in his father; Diabetes in his sister; Healthy in his daughter and son; Stroke in his mother. There is no history of Lung cancer or Ovarian cancer.   Allergies Allergies  Allergen Reactions   Tape Other (See Comments)    Very thin skin, tears easily     Home Medications  Prior to Admission medications   Medication Sig Start Date End Date Taking?  Authorizing Provider  acetaminophen (TYLENOL) 325 MG tablet Take 650 mg by mouth every 6 (six) hours as needed for mild pain, moderate pain, fever or headache.   Yes [provider]  allopurinol (ZYLOPRIM) 300 MG tablet Take 300 mg by mouth in the morning. 12/11/21  Yes [provider]  ALPRAZolam Duanne Moron) 0.5 MG tablet Take 1 tablet (0.5 mg total) by mouth See admin instructions. Take one tablet (0.5 mg) by mouth on Monday, Wednesday, Friday before dialysis, may also take one tablet (0.5 mg) daily as needed for anxiety. 02/07/22  Yes Tat, Shanon Brow, MD  ASCORBIC ACID PO Take 1 tablet by mouth in the morning. Vitamin C, unknown strength.   Yes [provider]  Calcium Carbonate Antacid (TUMS PO) Take 2 tablets by mouth with breakfast, with lunch, and with evening meal.   Yes [provider]  cetirizine (ZYRTEC) 10 MG tablet Take 10 mg by mouth in the morning. 06/24/21  Yes [provider]  Cholecalciferol (VITAMIN D-3 PO) Take 1 capsule by mouth in the morning.   Yes [provider]  diltiazem (DILACOR XR) 120 MG 24 hr capsule Take 120 mg by mouth in the morning. 05/19/22  Yes [provider]  famotidine (PEPCID) 20 MG tablet Take 1 tablet (20 mg total) by mouth 2 (two)  times daily. 07/30/21 07/30/22 Yes Barton Dubois, MD  furosemide (LASIX) 40 MG tablet Take 80 mg by mouth in the morning. 12/29/19  Yes [provider]  HUMALOG KWIKPEN 100 UNIT/ML KwikPen Inject 10-50 Units into the skin See admin instructions. Inject 10-50 units three times daily with meals per sliding scale. 04/23/21  Yes [provider]  HYDROcodone-acetaminophen (NORCO/VICODIN) 5-325 MG tablet Take 1 tablet by mouth daily as needed for severe pain.   Yes [provider]  hydrOXYzine (ATARAX/VISTARIL) 25 MG tablet Take 25 mg by mouth 3 (three) times daily. 11/23/20  Yes [provider]  iron polysaccharides (NIFEREX) 150 MG capsule Take 150 mg by  mouth at bedtime.   Yes [provider]  Lactobacillus (ACIDOPHILUS PO) Take 1 capsule by mouth in the morning.   Yes [provider]  lactulose (CHRONULAC) 10 GM/15ML solution Take 15 mLs (10 g total) by mouth 2 (two) times daily. Patient taking differently: Take 10 g by mouth See admin instructions. 10g twice daily on Sunday, Tuesday, Thursday, Saturday 10g in the evening on Monday, Wednesday, Friday after dialysis. 02/07/22  Yes Tat, Shanon Brow, MD  metoprolol succinate (TOPROL-XL) 25 MG 24 hr tablet Take 1 tablet (25 mg total) by mouth daily. Patient taking differently: Take 25 mg by mouth See admin instructions. 25 mg twice daily on Sunday, Tuesday, Thursday, Saturday. 25 mg at bedtime on Monday, Wednesday, Friday (does not take before dialysis) 02/22/22  Yes Tat, Shanon Brow, MD  mirtazapine (REMERON) 30 MG tablet Take 1 tablet (30 mg total) by mouth at bedtime. 02/10/20  Yes Roxan Hockey, MD  Multiple Vitamin (MULTIVITAMIN WITH MINERALS) TABS tablet Take 1 tablet by mouth in the morning.    Yes [provider]  nitroGLYCERIN (NITROGLYN) 2 % ointment Apply 2 inches topically once as needed for chest pain.   Yes [provider]  omega-3 acid ethyl esters (LOVAZA) 1 g capsule Take 2 g by mouth 2 (two) times daily.  04/07/19  Yes [provider]  ondansetron (ZOFRAN) 4 MG tablet Take 4 mg by mouth every 8 (eight) hours as needed for nausea or vomiting.   Yes [provider]  pantoprazole (PROTONIX) 40 MG tablet Take 1 tablet (40 mg total) by mouth 2 (two) times daily. 02/21/22  Yes Tat, Shanon Brow, MD  pravastatin (PRAVACHOL) 20 MG tablet Take 20 mg by mouth in the morning.    Yes [provider]  rifaximin (XIFAXAN) 550 MG TABS tablet Take 1 tablet (550 mg total) by mouth 2 (two) times daily. 05/02/22  Yes Emokpae, Courage, MD  SSD 1 % cream Apply 2 Applications topically 2 (two) times daily as needed (bed sores). 02/26/22  Yes [provider]   sulfamethoxazole-trimethoprim (BACTRIM DS) 800-160 MG tablet TAKE 1 TABLET BY MOUTH every friday AFTER dialysis Patient taking differently: Take 1 tablet by mouth See admin instructions. 1 tablet every Friday, after dialysis 03/12/22  Yes Annitta Needs, NP  thiamine (VITAMIN B-1) 100 MG tablet Take 100 mg by mouth in the morning. 08/27/20  Yes [provider]  tiZANidine (ZANAFLEX) 2 MG tablet Take 2 mg by mouth at bedtime as needed for muscle spasms. 04/07/22  Yes [provider]  TRESIBA FLEXTOUCH 200 UNIT/ML FlexTouch Pen Inject 10-50 Units into the skin See admin instructions. Inject 10-50 units once daily at bedtime as needed for high blood sugar levels, per sliding scale. 02/17/22  Yes [provider]  triamcinolone cream (KENALOG) 0.1 % Apply 1 application  topically 2 (two) times daily as needed (dry skin, itching). 12/30/17  Yes [provider]  venlafaxine (EFFEXOR) 75 MG tablet Take 75 mg by mouth 3 (three) times daily with meals.    Yes [provider]  zinc sulfate 220 (50 Zn) MG capsule Take 1 capsule (220 mg total) by mouth daily. Patient taking differently: Take 220 mg by mouth in the morning. 05/03/22  Yes Roxan Hockey, MD     Critical care time:      Gaylan Gerold, DO Internal Medicine Residency My pager: [phone]

## 2022-06-23 NOTE — Progress Notes (Signed)
ABG ordered however venous sample was obtained.  Results were given to MD.  No further orders at this time.  Unit RT aware.  Will continue to monitor.      Latest Reference Range & Units 06/23/22 09:26  Sample type  VENOUS  pH, Ven 7.25 - 7.43  7.318  pCO2, Ven 44 - 60 mmHg 52.8  pO2, Ven 32 - 45 mmHg 21 (LL)  TCO2 22 - 32 mmol/L 29  Acid-Base Excess 0.0 - 2.0 mmol/L 1.0  Bicarbonate 20.0 - 28.0 mmol/L 27.1  O2 Saturation % 31  Patient temperature  98.4 F  Collection site  RADIAL, ALLEN'S TEST ACCEPTABLE

## 2022-06-23 NOTE — Progress Notes (Signed)
Attending:    Subjective: 77 y/o with cirrhosis, ESRD admitted for Acute on chronic respiratory failure with hypoxemia from Flu A pneumonia Had been treated with BIPAP, required intubation yesterday in setting of confusion and hypoxemia Hypothermia overnight Remains mechanically ventilated On levophed 55mg/min, precedex Anuric Missed HD yesterday  Objective: Vitals:   06/23/22 0900 06/23/22 0915 06/23/22 0930 06/23/22 0945  BP: (!) 123/39 (!) 133/45 (!) 115/43 (!) 118/59  Pulse: 70 72 70 82  Resp: (!) 24 (!) 24 (!) 27 (!) 24  Temp:      TempSrc:      SpO2: 97% 98% 98% 94%  Weight:      Height:       Vent Mode: PRVC FiO2 (%):  [40 %-100 %] 40 % Set Rate:  [30 bmp] 30 bmp Vt Set:  [440 mL-460 mL] 440 mL PEEP:  [5 cmH20-8 cmH20] 5 cmH20 Plateau Pressure:  [26 cmH20-40 cmH20] 26 cmH20  Intake/Output Summary (Last 24 hours) at 06/23/2022 0950 Last data filed at 06/23/2022 0600 Gross per 24 hour  Intake 331.21 ml  Output 10 ml  Net 321.21 ml    General:  chronically ill appearing, in bed on vent HENT: NCAT ETT in place PULM: CTA B, vent supported breathing CV: RRR, no mgr GI: BS+, soft, nontender MSK: normal bulk and tone Neuro: sedated on vent, reaches for tube    CBC    Component Value Date/Time   WBC 18.8 (H) 06/23/2022 0818   RBC 2.26 (L) 06/23/2022 0818   HGB 7.5 (L) 06/23/2022 0926   HCT 22.0 (L) 06/23/2022 0926   HCT 28 02/25/2012 1034   PLT 130 (L) 06/23/2022 0818   PLT 125 02/25/2012 1034   MCV 96.9 06/23/2022 0818   MCV 82.0 02/25/2012 1034   MCH 31.0 06/23/2022 0818   MCHC 32.0 06/23/2022 0818   RDW 18.5 (H) 06/23/2022 0818   LYMPHSABS 0.1 (L) 06/23/2022 0818   MONOABS 0.4 06/23/2022 0818   EOSABS 0.0 06/23/2022 0818   BASOSABS 0.0 06/23/2022 0818    BMET    Component Value Date/Time   NA 130 (L) 06/23/2022 0926   K 4.5 06/23/2022 0926   CL 92 (L) 06/22/2022 1808   CO2 26 06/22/2022 1808   GLUCOSE 193 (H) 06/22/2022 1808   BUN 99 (H)  06/22/2022 1808   CREATININE 4.06 (H) 06/22/2022 1808   CREATININE 2.65 (H) 11/16/2019 1203   CALCIUM 9.0 06/22/2022 1808   CALCIUM 8.3 (L) 09/19/2020 1024   GFRNONAA 15 (L) 06/22/2022 1808   GFRNONAA 23 (L) 11/16/2019 1203   GFRAA 15 (L) 02/20/2020 0636   GFRAA 27 (L) 11/16/2019 1203    CXR images trace pleural effusion, kerley B, mild air space disease in bases   Impression/Plan: Acute respiratory failure with hypoxemia Acute pulmonary edema HD today> needs volume removal F/u resp culture Full mechanical vent support VAP prevention Daily WUA/SBT Started solumedrol yesterday, d/c today  Flu A pneumonia > completed tamiflu  ESRD HD per renal Monitor BMET and UOP Replace electrolytes as needed  Cirrhosis Rifaximin and lactulose  Need for sedation for mechanical ventilation Acute metabolic encephalopathy Hepatic encephalopathy HD today Continue lactulose, rifaximin Minimize sedatives Wean off precedex per PAD protocol  My cc time 31 minutes  BRoselie Awkward MD [place] [facility] Pager: [phone]: ([phone] 7pm: [phone]

## 2022-06-23 NOTE — Progress Notes (Signed)
Initial Nutrition Assessment  DOCUMENTATION CODES:   Severe malnutrition in context of chronic illness  INTERVENTION:  Pt at risk for refeeding based on malnutrition and likely prolonged poor intake initiate and advance TF slowly and monitor Mg, phosphorus and K x 3 days and replete as needed  Initiate tube feeding via OGT: Vital 1.5 at 55 ml/h (132 ml per day) Start at 82m/h and advance by [id number] to goal Prosource TF20 60 ml 1x/d Provides 2060 kcal, 109 gm protein, 1008 ml free water daily Thiamine '100mg'$  x 10 days for risk of refeeding Renavite daily for HD needs   NUTRITION DIAGNOSIS:  Severe Malnutrition related to chronic illness as evidenced by severe fat depletion, severe muscle depletion.  GOAL:  Patient will meet greater than or equal to 90% of their needs  MONITOR:  Vent status, Labs, TF tolerance, Weight trends, Skin, I & O's  REASON FOR ASSESSMENT:  Ventilator, Consult Enteral/tube feeding initiation and management  ASSESSMENT:  Pt with hx of atrial fibrillation, alcoholic cirrhosis with esophageal varices, ESRD on HD, DM type 2, GERD, HTN, and chronic respiratory on home O2 [facility] scheduled [facility] but with complaints of SOB. [facility]  [facility], 2.3L removed  1/22 - rapid response, intubated and transferred to ICU  Pt found to be positive for Flu A. While on the floor, pt refusing some doses of lactulose and developed hepatic encephalopathy. Developed respiratory distress 1/22 and was subsequently intubated and transferred to the ICU    Patient is currently intubated on ventilator support. OGT in place terminates in the mid-stomach. No family at bedside at the time of assessment to provide a nutrition hx but on exam, pt is severely malnourished with ecchymosis and bandages throughout his extremities. Weight hx is variable, but pt  is down 13.6kg for his recorded outpatient EDW.   Pt is at high risk for refeeding, will initiate TF slowly and monitor electrolytes for need for repletion.   MV: 13.1 L/min Temp (24hrs), Avg:97.4 F (36.3 C), Min:94.3 F (34.6 C), Max:98.6 F (37 C)  Last HD 1/19, missed 1/22 due to respiratory failure: Net UF 2.4L, post HD wt 66.9  Outpatient: MWF schedule, LU AVF EDW: 80.5kg   Intake/Output Summary (Last 24 hours) at 06/23/2022 1322 Last data filed at 06/23/2022 1100 Gross per 24 hour  Intake 486.76 ml  Output 10 ml  Net 476.76 ml  Net IO Since Admission: -4,337.24 mL [06/23/22 1322]  Average Meal Intake: 1/17-1/21: 42% intake x 6 recorded meals (prior to intubation)  Nutritionally Relevant Medications: Scheduled Meds:  docusate  100 mg Per Tube BID   insulin aspart  0-20 Units Subcutaneous Q4H   iron polysaccharides  150 mg Per Tube Daily   lactulose  30 g Oral TID   methylPREDNISolone   40 mg Intravenous BID   polyethylene glycol  17 g Per Tube Daily   pravastatin  20 mg Per Tube q AM   Continuous Infusions:  dexmedetomidine (PRECEDEX) IV infusion 1 mcg/kg/hr (06/23/22 0600)   norepinephrine (LEVOPHED) Adult infusion 5 mcg/min (06/23/22 0600)   PRN Meds: alum & mag hydroxide-simeth, ondansetron, polyethylene glycol  Labs Reviewed: Na 132, chloride 92 BUN 99, creatinine 4.06 CBG ranges from 157-259 mg/dL over the last 24 hours HgbA1c 6.1% (11/23) Ammonia - 82,1/20   Lab Results  Component Value Date   ALT 26 06/17/2022  AST 35 06/17/2022   ALKPHOS 208 (H) 06/17/2022   BILITOT 1.0 06/17/2022    NUTRITION - FOCUSED PHYSICAL EXAM: Flowsheet Row Most Recent Value  Orbital Region Severe depletion  Upper Arm Region Severe depletion  Thoracic and Lumbar Region Severe depletion  Buccal Region Severe depletion  Temple Region Severe depletion  Clavicle Bone Region Severe depletion  Clavicle and Acromion Bone Region Severe depletion  Scapular Bone Region  Severe depletion  Dorsal Hand Severe depletion  Patellar Region Severe depletion  Anterior Thigh Region Severe depletion  Posterior Calf Region Severe depletion  Edema (RD Assessment) None  Hair Reviewed  Eyes Reviewed  Mouth Reviewed  Skin Reviewed  [ecchymosis and wounds throughout]  Nails Reviewed   Diet Order:   Diet Order     None       EDUCATION NEEDS:   Not appropriate for education at this time  Skin:  Skin Assessment: Reviewed RN Assessment (ecchymosis throughout body, skin tear left elbow)  Last BM:  1/22 - type 5  Height:   Ht Readings from Last 1 Encounters:  06/23/22 '5\' 7"'$  (1.702 m)    Weight:   Wt Readings from Last 1 Encounters:  06/19/22 66.3 kg    Ideal Body Weight:  67.3 kg  BMI:  Body mass index is 22.89 kg/m.  Estimated Nutritional Needs:   Kcal:  2000-2200 kcal/d  Protein:  100-120g/d  Fluid:  1L+UOP    Ranell Patrick, RD, LDN Clinical Dietitian RD pager # available in [facility]  After hours/weekend pager # available in [facility]

## 2022-06-23 NOTE — Progress Notes (Signed)
PT Cancellation Note  Patient Details Name: Jeffrey [name] MRN: [id number] DOB: Jan 03, 1946   Cancelled Treatment:    Reason Eval/Treat Not Completed: Patient not medically ready - intubated, sedated, PT to check back.   Stacie Glaze, PT DPT Acute Rehabilitation Services Pager [phone]  Office [phone]    Louis Matte 06/23/2022, 11:55 AM

## 2022-06-23 NOTE — Progress Notes (Signed)
Received patient in ICU Informed consent signed and in chart.   Treatment initiated: 1637 Treatment completed: 1940  Patient tolerated treatment.  Hand-off given to patient's nurse.   Access used: AVF Access issues: none  Total UF removed: 2 L Medication(s) given: Albumin 25 g IV Post HD VS: 150/60 P 128 R 18 Post HD weight: 65 kg   [facility]

## 2022-06-23 NOTE — Procedures (Addendum)
Central Venous Catheter Insertion Procedure Note  ZABDIEL DRIPPS  [id number]  July 15, 1945  Date:06/23/22  Time:3:00 AM   Provider Performing:Lachele Lievanos A Affan Callow   Procedure: Insertion of Non-tunneled Central Venous Catheter(36556) with US guidance (78588)   Indication(s) Medication administration and Difficult access  Consent Unable to obtain consent due to emergent nature of procedure.  Anesthesia Topical only with 1% lidocaine   Timeout Verified patient identification, verified procedure, site/side was marked, verified correct patient position, special equipment/implants available, medications/allergies/relevant history reviewed, required imaging and test results available.  Sterile Technique Maximal sterile technique including full sterile barrier drape, hand hygiene, sterile gown, sterile gloves, mask, hair covering, sterile ultrasound probe cover (if used).  Procedure Description Area of catheter insertion was cleaned with chlorhexidine and draped in sterile fashion.  With real-time ultrasound guidance a central venous catheter was placed into the right internal jugular vein. Nonpulsatile blood flow and easy flushing noted in all ports.  The catheter was sutured in place and sterile dressing applied.  Complications/Tolerance None; patient tolerated the procedure well. Chest X-ray is ordered to verify placement for internal jugular or subclavian cannulation.   Chest x-ray is not ordered for femoral cannulation.  EBL Minimal  Specimen(s) None

## 2022-06-24 DIAGNOSIS — J9621 Acute and chronic respiratory failure with hypoxia: Secondary | ICD-10-CM | POA: Diagnosis not present

## 2022-06-24 DIAGNOSIS — D500 Iron deficiency anemia secondary to blood loss (chronic): Secondary | ICD-10-CM

## 2022-06-24 DIAGNOSIS — K7031 Alcoholic cirrhosis of liver with ascites: Secondary | ICD-10-CM | POA: Diagnosis not present

## 2022-06-24 DIAGNOSIS — K7682 Hepatic encephalopathy: Secondary | ICD-10-CM | POA: Diagnosis not present

## 2022-06-24 LAB — APTT: aPTT: 46 seconds — ABNORMAL HIGH (ref 24–36)

## 2022-06-24 LAB — IRON AND TIBC
Iron: 35 ug/dL — ABNORMAL LOW (ref 45–182)
Saturation Ratios: 21 % (ref 17.9–39.5)
TIBC: 169 ug/dL — ABNORMAL LOW (ref 250–450)
UIBC: 134 ug/dL

## 2022-06-24 LAB — CBC
HCT: 17 % — ABNORMAL LOW (ref 39.0–52.0)
HCT: 23.7 % — ABNORMAL LOW (ref 39.0–52.0)
Hemoglobin: 5.6 g/dL — CL (ref 13.0–17.0)
Hemoglobin: 8 g/dL — ABNORMAL LOW (ref 13.0–17.0)
MCH: 32.4 pg (ref 26.0–34.0)
MCH: 32 pg (ref 26.0–34.0)
MCHC: 32.9 g/dL (ref 30.0–36.0)
MCHC: 33.8 g/dL (ref 30.0–36.0)
MCV: 98.3 fL (ref 80.0–100.0)
MCV: 94.8 fL (ref 80.0–100.0)
Platelets: 80 10*3/uL — ABNORMAL LOW (ref 150–400)
Platelets: 60 10*3/uL — ABNORMAL LOW (ref 150–400)
RBC: 1.73 MIL/uL — ABNORMAL LOW (ref 4.22–5.81)
RBC: 2.5 MIL/uL — ABNORMAL LOW (ref 4.22–5.81)
RDW: 19.5 % — ABNORMAL HIGH (ref 11.5–15.5)
RDW: 19.7 % — ABNORMAL HIGH (ref 11.5–15.5)
WBC: 8.7 10*3/uL (ref 4.0–10.5)
WBC: 6.8 10*3/uL (ref 4.0–10.5)
nRBC: 0.6 % — ABNORMAL HIGH (ref 0.0–0.2)
nRBC: 2.1 % — ABNORMAL HIGH (ref 0.0–0.2)

## 2022-06-24 LAB — RENAL FUNCTION PANEL
Albumin: 2.5 g/dL — ABNORMAL LOW (ref 3.5–5.0)
Anion gap: 11 (ref 5–15)
BUN: 55 mg/dL — ABNORMAL HIGH (ref 8–23)
CO2: 26 mmol/L (ref 22–32)
Calcium: 8.5 mg/dL — ABNORMAL LOW (ref 8.9–10.3)
Chloride: 94 mmol/L — ABNORMAL LOW (ref 98–111)
Creatinine, Ser: 2.74 mg/dL — ABNORMAL HIGH (ref 0.61–1.24)
GFR, Estimated: 23 mL/min — ABNORMAL LOW (ref >60)
Glucose, Bld: 245 mg/dL — ABNORMAL HIGH (ref 70–99)
Phosphorus: 1.8 mg/dL — ABNORMAL LOW (ref 2.5–4.6)
Potassium: 3.5 mmol/L (ref 3.5–5.1)
Sodium: 131 mmol/L — ABNORMAL LOW (ref 135–145)

## 2022-06-24 LAB — PHOSPHORUS
Phosphorus: 1.9 mg/dL — ABNORMAL LOW (ref 2.5–4.6)
Phosphorus: 1.1 mg/dL — ABNORMAL LOW (ref 2.5–4.6)

## 2022-06-24 LAB — PREPARE RBC (CROSSMATCH)

## 2022-06-24 LAB — MAGNESIUM
Magnesium: 1.9 mg/dL (ref 1.7–2.4)
Magnesium: 1.5 mg/dL — ABNORMAL LOW (ref 1.7–2.4)

## 2022-06-24 LAB — PROTIME-INR
INR: 1.7 — ABNORMAL HIGH (ref 0.8–1.2)
Prothrombin Time: 19.7 seconds — ABNORMAL HIGH (ref 11.4–15.2)

## 2022-06-24 LAB — RETICULOCYTES
Immature Retic Fract: 43.6 % — ABNORMAL HIGH (ref 2.3–15.9)
RBC.: 1.86 MIL/uL — ABNORMAL LOW (ref 4.22–5.81)
Retic Count, Absolute: 72.7 10*3/uL (ref 19.0–186.0)
Retic Ct Pct: 3.9 % — ABNORMAL HIGH (ref 0.4–3.1)

## 2022-06-24 LAB — GLUCOSE, CAPILLARY
Glucose-Capillary: 196 mg/dL — ABNORMAL HIGH (ref 70–99)
Glucose-Capillary: 258 mg/dL — ABNORMAL HIGH (ref 70–99)
Glucose-Capillary: 218 mg/dL — ABNORMAL HIGH (ref 70–99)
Glucose-Capillary: 94 mg/dL (ref 70–99)
Glucose-Capillary: 156 mg/dL — ABNORMAL HIGH (ref 70–99)
Glucose-Capillary: 277 mg/dL — ABNORMAL HIGH (ref 70–99)

## 2022-06-24 MED ORDER — PANTOPRAZOLE 80MG IVPB - SIMPLE MED
80.0000 mg | Freq: Once | INTRAVENOUS | Status: AC
  Administered 2022-06-24: 80 mg via INTRAVENOUS
  Filled 2022-06-24: qty 100

## 2022-06-24 MED ORDER — SODIUM CHLORIDE 0.9 % IV SOLN
50.0000 ug/h | INTRAVENOUS | Status: DC
  Administered 2022-06-24 – 2022-06-25 (×3): 50 ug/h via INTRAVENOUS
  Filled 2022-06-24 (×3): qty 1

## 2022-06-24 MED ORDER — POTASSIUM CHLORIDE 10 MEQ/100ML IV SOLN: 10.0000 meq | INTRAVENOUS | Status: DC

## 2022-06-24 MED ORDER — SODIUM CHLORIDE 0.9% IV SOLUTION: Freq: Once | INTRAVENOUS | Status: AC

## 2022-06-24 MED ORDER — AMIODARONE HCL 200 MG PO TABS
100.0000 mg | ORAL_TABLET | Freq: Every day | ORAL | Status: DC
  Administered 2022-06-25: 100 mg
  Filled 2022-06-24: qty 1

## 2022-06-24 MED ORDER — POTASSIUM PHOSPHATES 15 MMOLE/5ML IV SOLN
15.0000 mmol | Freq: Once | INTRAVENOUS | Status: DC
  Filled 2022-06-24: qty 5

## 2022-06-24 MED ORDER — MAGNESIUM SULFATE 2 GM/50ML IV SOLN
2.0000 g | Freq: Once | INTRAVENOUS | Status: AC
  Administered 2022-06-24: 2 g via INTRAVENOUS
  Filled 2022-06-24: qty 50

## 2022-06-24 MED ORDER — MIDODRINE HCL 5 MG PO TABS
10.0000 mg | ORAL_TABLET | Freq: Three times a day (TID) | ORAL | Status: DC
  Administered 2022-06-24 (×2): 10 mg
  Filled 2022-06-24 (×2): qty 2

## 2022-06-24 MED ORDER — SODIUM CHLORIDE 0.9 % IV SOLN
1.0000 g | INTRAVENOUS | Status: DC
  Filled 2022-06-24: qty 10

## 2022-06-24 MED ORDER — PANTOPRAZOLE SODIUM 40 MG IV SOLR
40.0000 mg | Freq: Two times a day (BID) | INTRAVENOUS | Status: DC
  Filled 2022-06-24: qty 10

## 2022-06-24 MED ORDER — POTASSIUM PHOSPHATES 15 MMOLE/5ML IV SOLN
15.0000 mmol | Freq: Once | INTRAVENOUS | Status: AC
  Administered 2022-06-24: 15 mmol via INTRAVENOUS
  Filled 2022-06-24 (×2): qty 5

## 2022-06-24 MED ORDER — K PHOS MONO-SOD PHOS DI & MONO 155-852-130 MG PO TABS
250.0000 mg | ORAL_TABLET | Freq: Once | ORAL | Status: AC
  Administered 2022-06-24: 250 mg
  Filled 2022-06-24: qty 1

## 2022-06-24 MED ORDER — CHLORHEXIDINE GLUCONATE CLOTH 2 % EX PADS: 6.0000 | MEDICATED_PAD | Freq: Every day | CUTANEOUS | Status: DC

## 2022-06-24 MED ORDER — AMIODARONE HCL 200 MG PO TABS
100.0000 mg | ORAL_TABLET | Freq: Every day | ORAL | Status: DC
  Administered 2022-06-24: 100 mg via ORAL
  Filled 2022-06-24: qty 1

## 2022-06-24 MED ORDER — PANTOPRAZOLE SODIUM 40 MG IV SOLR: 40.0000 mg | Freq: Two times a day (BID) | INTRAVENOUS | Status: DC

## 2022-06-24 MED ORDER — PANTOPRAZOLE INFUSION (NEW) - SIMPLE MED
8.0000 mg/h | INTRAVENOUS | Status: DC
  Administered 2022-06-24 – 2022-06-25 (×3): 8 mg/h via INTRAVENOUS
  Filled 2022-06-24 (×4): qty 100

## 2022-06-24 NOTE — Consult Note (Addendum)
NAME:  Jeffrey [name], MRN:  [id number], DOB:  [birth year], LOS: 9 ADMISSION DATE:  06/13/2022, CONSULTATION DATE:  06/22/22 REFERRING MD:  Sabino Gasser TRH, CHIEF COMPLAINT:  AMS   History of Present Illness:  77 yo M PMH ESRD on MWF HD, alcoholic cirrhosis, DM2, Chronic hypoxic respiratory failure on 2L, Afib, cirrhosis was admitted 1/15 for acute on chronic hypoxic resp failure, Flu PNA.   On  1/22 he was more lethargic. While in iHD he was started on BiPAP but remained lethargic.  ABG with resp acidosis   PCCM consulted for intubation in this setting   Pertinent  Medical History   Past Medical History:  Diagnosis Date   A-fib [facility])    when initially stating dialysis january 2021 at [facility] per spouse; never heard anything else about it   Alcoholic cirrhosis [facility])    patient reports completing hep a and b vaccines in 2006   Anemia    has had 3 units of prbcs 2013   Anxiety    Arthritis    Asymptomatic gallstones    Ultrasound in 2006   B12 deficiency 11/21/2020   C. difficile colitis    Cholelithiasis    Chronic kidney disease    Dialysis M/W/F/Sa   Depression    Diabetes ([place])    Duodenal ulcer with hemorrhage    per patient in 2006 or 2007, records have been requested   Dyspnea    low oxygen level -  has O2 2 L all day   Esophageal varices ([place])    see PSH   GERD (gastroesophageal reflux disease)    Heart burn    History of alcohol abuse    quit 05/2013   HOH (hard of hearing)    Hypertension    Liver cirrhosis ([place])    ETOH   SBP (spontaneous bacterial peritonitis) ([place])    2020, November 2021.    Splenomegaly    Ultrasound in 2006    Significant Hospital Events: Including procedures, antibiotic start and stop dates in addition to other pertinent events   1/15 admit to [facility] for iHD need 1/15 nephro consult, HD  1/16 Flu A pos, tamiflu  1/18 para, no SBP  1/19 steroids added for incr O2 need  1/22 PCCM consulted for AMS hypercarbic resp  failure, SOB. Transfer to ICU for intubation   Interim History / Subjective:  Patient seen at bedside.  He is more awake and alert but not following commands.  He denies any discomfort when asked. Objective   Blood pressure (!) 108/49, pulse (!) 110, temperature 98.4 F (36.9 C), temperature source Axillary, resp. rate (!) 21, height '5\' 7"'$  (1.702 m), weight 65.7 kg, SpO2 92 %.    Vent Mode: PRVC FiO2 (%):  [40 %] 40 % Set Rate:  [18 bmp-30 bmp] 18 bmp Vt Set:  [440 mL] 440 mL PEEP:  [5 cmH20] 5 cmH20 Plateau Pressure:  [24 cmH20-26 cmH20] 24 cmH20   Intake/Output Summary (Last 24 hours) at 06/24/2022 0735 Last data filed at 06/24/2022 0700 Gross per 24 hour  Intake 2235.66 ml  Output 2000 ml  Net 235.66 ml    Filed Weights   06/23/22 1655 06/23/22 2012 06/24/22 0500  Weight: 67 kg 65 kg 65.7 kg    Examination: Physical exam: General: Chronically male, cachetic HEENT: Dry mucous membranes.  ETT tube in place Neuro: More responsive compared to yesterday but cannot follow consistent command. Chest: no wheezes  Heart: Tachycardic and irregular,  no murmurs or gallops Abdomen: Soft, nondistended, bowel sounds present Skin: No rash   Resolved Hospital Problem list     Assessment & Plan:  Acute on chronic deficiency anemia History of GI bleed Received 2 unit of blood for hemoglobin of 5.6 overnight.  Melanotic stool noted so he received IV PPI, octreotide and GI was consulted. Patient had 2 EGD in September 2023 and had APC for bleeding gastropathy.  He only has grade 1 varices.  He had a colonoscopy in 12/2018 through which had a possible bleeding polypoid lesion in the splenic flexure. -Holding DVT prophylaxis -Follow-up posttransfusion H&H -Appreciate GI recommendation -Continue IV PPI and octreotide -Check iron storage  Acute on chronic respiratory failure with hypoxia and hypercapnia Influenza A PNA Patient uses 2 L oxygen at baseline This event is likely due to  hypervolemia from missing 2 HD sections.  Patient received HD yesterday which pulled 2 L fluid.  Will attempt weaning today if able. -No indication for antibiotic or steroid -Continue weaning daily.  -VAP bundle  Acute metabolic encephalopathy Multifactorial from uremia, hypercarbia and hepatic encephalopathy. -BUN improved after HD -Ventilator support for his hypercarbia -Continue lactulose and rifaximin.  He had 1 BM this morning.  I will hold off on increasing the dose to avoid further electrolyte derangement and his acute GI bleed. -Wean Precedex if able  Shock of multifactorial causes Secondary to IV sedation in the setting of cirrhosis and ESRD.  Now also had GI bleed -Continue midodrine 10 mg 3 times daily -Continue Levophed.  Wean as able  A-fib with RVR Patient has history of A-fib and on metoprolol at home. His RVR is likely secondary to the acute blood loss anemia. -Continue amiodarone infusion for now.  His rate should improve after receiving blood. -He is not on anticoagulation at home.  Not a candidate for DOAC given his ESRD.  Warfarin may be his only choice.  Not indicated for now given GI bleed  ESRD on HD MWF -Nephrology is following  Decompensated alcoholic cirrhosis with ascites with esophageal varices and encephalopathy Patient had paracentesis done on 1/18, which was negative for SBP Continue lactulose, rifaximin, inderol    Hypervolemic hyponatremia Monitor electrolytes  DM2 Continue scale insulin with CBG goal 140-180 -Starting tube feed today  Best Practice (right click and "Reselect all SmartList Selections" daily)   Diet/type: NPO tube feed DVT prophylaxis: NA GI prophylaxis: IV PPI Lines: RIJ  Foley:  NA Code Status:  DNR Last date of multidisciplinary goals of care discussion [1/23}  Labs   CBC: Recent Labs  Lab 06/19/22 0430 06/20/22 0344 06/21/22 0403 06/22/22 0333 06/22/22 1808 06/23/22 0818 06/23/22 0926 06/24/22 0443  WBC  8.6 5.5 7.8 5.4 14.5* 18.8*  --  8.7  NEUTROABS 7.9* 5.0 7.5 5.1  --  18.2*  --   --   HGB 8.5* 8.2* 8.2* 7.4* 8.1* 7.0* 7.5* 5.6*  HCT 26.9* 26.4* 25.4* 23.8* 25.6* 21.9* 22.0* 17.0*  MCV 99.3 101.5* 100.0 100.8* 98.5 96.9  --  98.3  PLT 109* 105* 102* 84* 124* 130*  --  80*     Basic Metabolic Panel: Recent Labs  Lab 06/21/22 0403 06/22/22 0333 06/22/22 1808 06/23/22 0818 06/23/22 0926 06/23/22 2027 06/24/22 0443  NA 131* 131* 132* 128* 130*  --  131*  K 4.2 4.3 3.9 4.3 4.5  --  3.5  CL 92* 92* 92* 90*  --   --  94*  CO2 '26 25 26 24  '$ --   --  26  GLUCOSE 239* 214* 193* 209*  --   --  245*  BUN 73* 103* 99* 114*  --   --  55*  CREATININE 3.41* 4.13* 4.06* 4.53*  --   --  2.74*  CALCIUM 8.9 8.9 9.0 9.0  --   --  8.5*  MG 2.0 2.1  --  2.1  --  1.7 1.9  PHOS 4.4 4.2 4.4 2.5  --  1.7* 1.9*  1.8*    GFR: Estimated Creatinine Clearance: 21.6 mL/min (A) (by C-G formula based on SCr of 2.74 mg/dL (H)). Recent Labs  Lab 06/22/22 0333 06/22/22 1808 06/23/22 0818 06/24/22 0443  WBC 5.4 14.5* 18.8* 8.7     Liver Function Tests: Recent Labs  Lab 06/21/22 0403 06/22/22 0333 06/22/22 1808 06/23/22 0818 06/24/22 0443  ALBUMIN 2.0* 2.0* 2.2* 2.0* 2.5*    No results for input(s): "LIPASE", "AMYLASE" in the last 168 hours. Recent Labs  Lab 06/20/22 0344 06/23/22 0818  AMMONIA 82* 91*     ABG    Component Value Date/Time   PHART 7.28 (L) 06/22/2022 1810   PCO2ART 61 (H) 06/22/2022 1810   PO2ART 282 (H) 06/22/2022 1810   HCO3 27.1 06/23/2022 0926   TCO2 29 06/23/2022 0926   ACIDBASEDEF 5.6 (H) 06/22/2022 1302   O2SAT 31 06/23/2022 0926     Coagulation Profile: No results for input(s): "INR", "PROTIME" in the last 168 hours.  Cardiac Enzymes: No results for input(s): "CKTOTAL", "CKMB", "CKMBINDEX", "TROPONINI" in the last 168 hours.  HbA1C: Hgb A1c MFr Bld  Date/Time Value Ref Range Status  04/23/2022 02:40 AM 6.1 (H) 4.8 - 5.6 % Final    Comment:     (NOTE)         Prediabetes: 5.7 - 6.4         Diabetes: >6.4         Glycemic control for adults with diabetes: <7.0   07/29/2021 05:22 AM 5.5 4.8 - 5.6 % Final    Comment:    (NOTE) Pre diabetes:          5.7%-6.4%  Diabetes:              >6.4%  Glycemic control for   <7.0% adults with diabetes     CBG: Recent Labs  Lab 06/23/22 1911 06/23/22 2300 06/23/22 2308 06/24/22 0318 06/24/22 0721  GLUCAP 161* 158* 168* 196* 258*     Review of Systems:   Unable to obtain due to encephalopathy   Past Medical History:  He,  has a past medical history of A-fib ([place]), Alcoholic cirrhosis ([place]), Anemia, Anxiety, Arthritis, Asymptomatic gallstones, B12 deficiency (11/21/2020), C. difficile colitis, Cholelithiasis, Chronic kidney disease, Depression, Diabetes ([place]), Duodenal ulcer with hemorrhage, Dyspnea, Esophageal varices ([place]), GERD (gastroesophageal reflux disease), Heart burn, History of alcohol abuse, HOH (hard of hearing), Hypertension, Liver cirrhosis ([place]), SBP (spontaneous bacterial peritonitis) ([place]), and Splenomegaly.   Surgical History:   Past Surgical History:  Procedure Laterality Date   AGILE CAPSULE N/A 09/20/2019   Procedure: AGILE CAPSULE;  Surgeon: Daneil Dolin, MD;  [facility];  Service: Endoscopy;  Laterality: N/A;   APPENDECTOMY     AV FISTULA PLACEMENT Left 08/17/2019   Procedure: ARTERIOVENOUS (AV) FISTULA CREATION VERSES ARTERIOVENOUS GRAFT;  Surgeon: [name], MD;  Location: [facility];  Service: Vascular;  Laterality: Left;   BASCILIC VEIN TRANSPOSITION Left 09/28/2019   Procedure: LEFT SECOND STAGE [place];  Surgeon: [name], MD;  Location: [facility];  Service: Vascular;  Laterality: Left;   BIOPSY  02/03/2018   Procedure: BIOPSY;  Surgeon: Daneil Dolin, MD;  [facility];  Service: Endoscopy;;  bx of gastric polyps   BIOPSY  09/19/2019   Procedure: BIOPSY;  Surgeon: Danie Binder, MD;  [facility];  Service: Endoscopy;;   BIOPSY  01/16/2021   Procedure: BIOPSY;  Surgeon: Daneil Dolin, MD;  [facility];  Service: Endoscopy;;   CATARACT EXTRACTION Right    COLONOSCOPY  03/10/2005   Rectal polyp as described above, removed with snare. Left sided  diverticula. The remainder of the colonic mucosa appeared normal. Inflammed polyp on path.   COLONOSCOPY  04/1999   Dr. Thea Silversmith polyps removed,  Path showed hyperplastic   COLONOSCOPY  10/2015   Dr. Britta Mccreedy: diverticulosis, single sessile tubular adenoma 3-24m in size removed from descending colon.    COLONOSCOPY WITH ESOPHAGOGASTRODUODENOSCOPY (EGD)  03/31/2012   REBR:AXENMMHAVMs. Colonic diverticulosis. tubular adenoma colon   COLONOSCOPY WITH ESOPHAGOGASTRODUODENOSCOPY (EGD)  06/2019   FORSYTH: small esophageal varices without high risk stigmata, single large AVM on lesser curvature in gastric body s/p APC ablation, gastric antral and duodenal bulb polyposis. No significant source to explain transfusion dependent anemia. Colonoscopy with portal colopathy and diffuse edema, changes of Cdiff colitis on colonoscopy   COLONOSCOPY WITH PROPOFOL N/A 01/19/2021   Surgeon: CDaryel November MD; 15 mm polyp in descending colon removed piecemeal, 6 mm polyp in ascending colon and 12 mm polyp in sigmoid colon not removed due to high bleeding risk, benign polypoid lesion at splenic flexure, diverticulosis in sigmoid and descending colon, rectal varices.   COLONOSCOPY WITH PROPOFOL N/A 01/16/2021   Surgeon: RDaneil Dolin MD;  Portal colopathy, large rectal varices, polypoid mass at splenic flexure biopsy, two 1 cm left colon polyps not removed, left-sided diverticulosis.  Reported patient had bleeding from splenic flexure lesion.  Pathology with acutely inflamed colonic mucosa with localized ulceration and prominent reactive changes.   ESOPHAGEAL BANDING N/A 02/03/2018   Procedure: ESOPHAGEAL BANDING;  Surgeon: RDaneil Dolin MD;  [facility];  Service: Endoscopy;  Laterality: N/A;   ESOPHAGEAL BANDING  01/16/2021   Procedure: ESOPHAGEAL BANDING;  Surgeon: RDaneil Dolin MD;  [facility];  Service: Endoscopy;;   ESOPHAGOGASTRODUODENOSCOPY  01/15/2005   Three columns grade 1 to 2 esophageal varices, otherwise normal esophageal mucosa.  Esophagus was not manipulated otherwise./Nodularity of the antrum with overlying erosions, nonspecific finding. Path showed rare H.pylori   ESOPHAGOGASTRODUODENOSCOPY  09/2008   Dr. RGaylene Brookscolumns of grade 2-3 esoph varices, only one column was prominent. Portal gastropathy, multiple gastrc polyps at antrum, two were 2cm with black eschar, bulbar polyps, bulbar erosions   ESOPHAGOGASTRODUODENOSCOPY  11/2004   Dr. FBrantley Stage3 esoph varices   ESOPHAGOGASTRODUODENOSCOPY  03/31/2012   RMR: 4 columns(3-GR 2, 1-GR1) non-bleeding esophageal varices, portal gastropathy, small HH, early GAVE, multiple gastric polyps    ESOPHAGOGASTRODUODENOSCOPY (EGD) WITH PROPOFOL N/A 02/03/2018   Dr. RGala Romney Esophageal varices, 3 columns grade 1-2.  Portal hypertensive gastropathy.  Multiple gastric polyps, biopsy consistent with hyperplastic.   ESOPHAGOGASTRODUODENOSCOPY (EGD) WITH PROPOFOL N/A 09/19/2019   Fields: grade I and II esophageal varcies, mild portal hypertensive gastropathy, moderate gastritis but no H. pylori, single hyperplastic gastric polyp removed, obvious source for melena/transfusion dependent anemia not identified, may be due to friable gastric and duodenal mucosa in the setting of portal hypertension   ESOPHAGOGASTRODUODENOSCOPY (  EGD) WITH PROPOFOL N/A 01/16/2021   Surgeon: Daneil Dolin, MD; Portal hypertensive gastropathy, grade 2 esophageal varices with bleeding stigmata s/p banding x4, slight nodularity and congestion involving bulb and second portion of duodenum, nonspecific.  Recommended repeat EGD in 4 weeks.   ESOPHAGOGASTRODUODENOSCOPY  (EGD) WITH PROPOFOL N/A 05/22/2021   stigmata of prior esophageal banding and no significant varices seen. Portal gastropathy. Polypoid gastric and duodenal mucosa with overlying erosions, marked mucosal friability.   ESOPHAGOGASTRODUODENOSCOPY (EGD) WITH PROPOFOL N/A 02/06/2022   Procedure: ESOPHAGOGASTRODUODENOSCOPY (EGD) WITH PROPOFOL;  Surgeon: Harvel Quale, MD;  [facility];  Service: Gastroenterology;  Laterality: N/A;   GASTRIC VARICES BANDING  03/31/2012   Procedure: GASTRIC VARICES BANDING;  Surgeon: Daneil Dolin, MD;  [facility];  Service: Endoscopy;  Laterality: N/A;   GIVENS CAPSULE STUDY N/A 09/21/2019   Poor study with a lot of debris obstructing much of the view of the first approximate 3 hours out of 6 hours.  No obvious source of bleeding identified.  Sequela of bleeding and old blood seen in the form of"whisps" of blood, blood flecks mostly toward the end of the study.   HEMOSTASIS CLIP PLACEMENT  01/19/2021   Procedure: HEMOSTASIS CLIP PLACEMENT;  Surgeon: [name], MD;  Location[facility] ENDOSCOPY;  Service: Gastroenterology;;   HEMOSTASIS CLIP PLACEMENT  02/06/2022   Procedure: HEMOSTASIS CLIP PLACEMENT;  Surgeon: Montez Morita, Quillian Quince, MD;  [facility];  Service: Gastroenterology;;  gastric body   HOT HEMOSTASIS  02/06/2022   Procedure: HOT HEMOSTASIS (ARGON PLASMA COAGULATION/BICAP);  Surgeon: Montez Morita, Quillian Quince, MD;  [facility];  Service: Gastroenterology;;   IR PARACENTESIS  06/18/2022   IR REMOVAL TUN CV CATH W/O FL  02/15/2020   POLYPECTOMY  09/19/2019   Procedure: POLYPECTOMY;  Surgeon: Danie Binder, MD;  [facility];  Service: Endoscopy;;   POLYPECTOMY  01/19/2021   Procedure: POLYPECTOMY;  Surgeon: [name], MD;  Location[facility] ENDOSCOPY;  Service: Gastroenterology;;   UMBILICAL HERNIA REPAIR  2017   exploratory laparotomy, abdominal washout     Social History:    reports that he quit smoking about 36 years ago. His smoking use included cigarettes. He has a 50.00 pack-year smoking history. He has never used smokeless tobacco. He reports that he does not drink alcohol and does not use drugs.   Family History:  His family history includes Breast cancer in his sister; Colon cancer (age of onset: 12) in his father; Diabetes in his sister; Healthy in his daughter and son; Stroke in his mother. There is no history of Lung cancer or Ovarian cancer.   Allergies Allergies  Allergen Reactions   Tape Other (See Comments)    Very thin skin, tears easily     Home Medications  Prior to Admission medications   Medication Sig Start Date End Date Taking? Authorizing Provider  acetaminophen (TYLENOL) 325 MG tablet Take 650 mg by mouth every 6 (six) hours as needed for mild pain, moderate pain, fever or headache.   Yes [provider]  allopurinol (ZYLOPRIM) 300 MG tablet Take 300 mg by mouth in the morning. 12/11/21  Yes [provider]  ALPRAZolam Duanne Moron) 0.5 MG tablet Take 1 tablet (0.5 mg total) by mouth See admin instructions. Take one tablet (0.5 mg) by mouth on Monday, Wednesday, Friday before dialysis, may also take one tablet (0.5 mg) daily as needed for anxiety. 02/07/22  Yes Orson Eva, MD  ASCORBIC  ACID PO Take 1 tablet by mouth in the morning. Vitamin C, unknown strength.   Yes [provider]  Calcium Carbonate Antacid (TUMS PO) Take 2 tablets by mouth with breakfast, with lunch, and with evening meal.   Yes [provider]  cetirizine (ZYRTEC) 10 MG tablet Take 10 mg by mouth in the morning. 06/24/21  Yes [provider]  Cholecalciferol (VITAMIN D-3 PO) Take 1 capsule by mouth in the morning.   Yes [provider]  diltiazem (DILACOR XR) 120 MG 24 hr capsule Take 120 mg by mouth in the morning. 05/19/22  Yes [provider]  famotidine (PEPCID) 20 MG tablet Take 1 tablet (20 mg total) by mouth 2  (two) times daily. 07/30/21 07/30/22 Yes Barton Dubois, MD  furosemide (LASIX) 40 MG tablet Take 80 mg by mouth in the morning. 12/29/19  Yes [provider]  HUMALOG KWIKPEN 100 UNIT/ML KwikPen Inject 10-50 Units into the skin See admin instructions. Inject 10-50 units three times daily with meals per sliding scale. 04/23/21  Yes [provider]  HYDROcodone-acetaminophen (NORCO/VICODIN) 5-325 MG tablet Take 1 tablet by mouth daily as needed for severe pain.   Yes [provider]  hydrOXYzine (ATARAX/VISTARIL) 25 MG tablet Take 25 mg by mouth 3 (three) times daily. 11/23/20  Yes [provider]  iron polysaccharides (NIFEREX) 150 MG capsule Take 150 mg by mouth at bedtime.   Yes [provider]  Lactobacillus (ACIDOPHILUS PO) Take 1 capsule by mouth in the morning.   Yes [provider]  lactulose (CHRONULAC) 10 GM/15ML solution Take 15 mLs (10 g total) by mouth 2 (two) times daily. Patient taking differently: Take 10 g by mouth See admin instructions. 10g twice daily on Sunday, Tuesday, Thursday, Saturday 10g in the evening on Monday, Wednesday, Friday after dialysis. 02/07/22  Yes Tat, Shanon Brow, MD  metoprolol succinate (TOPROL-XL) 25 MG 24 hr tablet Take 1 tablet (25 mg total) by mouth daily. Patient taking differently: Take 25 mg by mouth See admin instructions. 25 mg twice daily on Sunday, Tuesday, Thursday, Saturday. 25 mg at bedtime on Monday, Wednesday, Friday (does not take before dialysis) 02/22/22  Yes Tat, Shanon Brow, MD  mirtazapine (REMERON) 30 MG tablet Take 1 tablet (30 mg total) by mouth at bedtime. 02/10/20  Yes Roxan Hockey, MD  Multiple Vitamin (MULTIVITAMIN WITH MINERALS) TABS tablet Take 1 tablet by mouth in the morning.    Yes [provider]  nitroGLYCERIN (NITROGLYN) 2 % ointment Apply 2 inches topically once as needed for chest pain.   Yes [provider]  omega-3 acid ethyl esters (LOVAZA) 1 g capsule Take 2 g by  mouth 2 (two) times daily.  04/07/19  Yes [provider]  ondansetron (ZOFRAN) 4 MG tablet Take 4 mg by mouth every 8 (eight) hours as needed for nausea or vomiting.   Yes [provider]  pantoprazole (PROTONIX) 40 MG tablet Take 1 tablet (40 mg total) by mouth 2 (two) times daily. 02/21/22  Yes Tat, Shanon Brow, MD  pravastatin (PRAVACHOL) 20 MG tablet Take 20 mg by mouth in the morning.    Yes [provider]  rifaximin (XIFAXAN) 550 MG TABS tablet Take 1 tablet (550 mg total) by mouth 2 (two) times daily. 05/02/22  Yes Emokpae, Courage, MD  SSD 1 % cream Apply 2 Applications topically 2 (two) times daily as needed (bed sores). 02/26/22  Yes [provider]  sulfamethoxazole-trimethoprim (BACTRIM DS) 800-160 MG tablet TAKE 1 TABLET BY  MOUTH every friday AFTER dialysis Patient taking differently: Take 1 tablet by mouth See admin instructions. 1 tablet every Friday, after dialysis 03/12/22  Yes Annitta Needs, NP  thiamine (VITAMIN B-1) 100 MG tablet Take 100 mg by mouth in the morning. 08/27/20  Yes [provider]  tiZANidine (ZANAFLEX) 2 MG tablet Take 2 mg by mouth at bedtime as needed for muscle spasms. 04/07/22  Yes [provider]  TRESIBA FLEXTOUCH 200 UNIT/ML FlexTouch Pen Inject 10-50 Units into the skin See admin instructions. Inject 10-50 units once daily at bedtime as needed for high blood sugar levels, per sliding scale. 02/17/22  Yes [provider]  triamcinolone cream (KENALOG) 0.1 % Apply 1 application  topically 2 (two) times daily as needed (dry skin, itching). 12/30/17  Yes [provider]  venlafaxine (EFFEXOR) 75 MG tablet Take 75 mg by mouth 3 (three) times daily with meals.    Yes [provider]  zinc sulfate 220 (50 Zn) MG capsule Take 1 capsule (220 mg total) by mouth daily. Patient taking differently: Take 220 mg by mouth in the morning. 05/03/22  Yes Roxan Hockey, MD     Critical care time:       Gaylan Gerold, DO Internal Medicine Residency My pager: [phone]

## 2022-06-24 NOTE — Consult Note (Addendum)
[facility] Consult: 8:15 AM 06/24/2022  LOS: 9 days    Referring Provider: Dr Lake Bells.    Primary Care Physician:  Monico Blitz, MD Primary Gastroenterologist:  Dr. Jenetta Downer    Reason for Consultation:  GIB.     HPI: Jeffrey [name] is a 77 y.o. male.  PHx ESRD, HD MWF hypertension.  Chronic respiratory failure, chronic oxygen requirement.  A-fib.  ETOH Cirrhosis.  Not a transplant candidate.  Paracentesis dependent ascites.  SBP.  Poor candidate for TIPS due to elevated MEL D.  HEENT.  Splenomegaly.  B12 deficiency.  Anemia, multiple transfusions over recent years..  C. Difficile, norovirus 07/2021.[name]  Cholelithiasis.  Duodenal ulcer with hemorrhage.  Transfusion 3 PRBCs 2013.  2017 ventral hernia repair.  Multiple upper, lower endoscopies.  Below are more recent studies. 09/19/2019 EGD.  For GI bleed, melena.  Grade 1 and grade 2 esophageal varices without stigmata.  Mild PHG.  Moderate gastritis, biopsied.  Solitary gastric polyp resected, retrieved.  No obvious source for melena in this transfusion-dependent anemia.  Pathology showed hyperplastic polyp with goblet cell metaplasia and chronic inactive gastritis, no H. pylori. 09/21/2019 VCE.  4 acute on chronic anemia.  Complete but poor study with debris obstructing much of the view for 3 of 6 hours.  No obvious tumors, masses, lesions.  No source for bleeding.  There were sequela of bleeding, old blood and wisps of blood flecks towards the end of the study.  In all the VCE did not add significant findings.  Recommended hematology consult for chronic anemia. 01/16/2021 colonoscopy for FOBT positive stool, Dr. Gala Romney.  Portal colopathy with large rectal varices.  Biopsy of friable, bleeding on contact, polypoid splenic flexure mass.  Polyps at descending colon not  biopsied.  Path: Acutely inflamed colonic mucosa, local ulceration and reactive changes 01/16/2021 EGD.  Dr. Mayford Knife, for heme positive stool.  Portal hypertensive gastropathy.  Grade 2 esophageal varices with ulcers, bleeding stigmata treated with band ligation x 6.  Polypoid, friable gastric mucosa.  Nonspecific abnormality duodenal bulb with nodularity, congestion.  01/19/21 Colonoscopy.  For hematochezia, blood loss anemia..  Dr. Candis Schatz.  1.5 cm descending colon polyp removed piecemeal and polypectomy site clipped.  6 mm polyp ascending colon.  Benign polypoid lesion splenic flexure, likely inflammatory.  It was this splenic flexure lesion that was felt to be source of bleeding as there was adherent clot and history that it had friability and bleeding at previous colonoscopy.  12 mm sigmoid polyp.  Descending diverticulosis.  Rectal varices.  Other than the polyp at descending colon, the other polyps were not removed. Path: TA.   02/06/2022 EGD.  Dr. Jenetta Downer for IDA.  Grade 1 esophageal varices.  Portal hypertensive gastropathy.  Oozing blood from stomach, AVM vs PHG source, treated w APC and clip.  Duodenitis.  No bx.  REc: Protonix 40 bid.  During the latest hospital GI consult with Dr. Jenetta Downer, they recommended palliative care consult.  Did not pursue additional endoscopic evaluation for recurrent anemia.  Patient is DNR.   Arrived [facility]  [place] 1/14 for scheduled paracentesis.  Staff did not perform as he was unable to lay flat on the table and had been short of breath with cough for 2 days.  Tested positive for flu and ended up on BiPAP.[name] Transferred to [place] bed as no dialysis available at [facility] ER.  At arrival oxygen requirement down to 5 L [place].  Desaturated, altered, hypotensive.  Labs confirmed respiratory acidosis.  BiPAP reinitiated and eventually intubation and transfer to ICU.  Pressor requiring.  Received 4 days of Solu-Medrol.  Yesterday IV amiodarone for A-fib,  RVR.   Hgbs as low as 6.3 on 12/28, 6.8 on 1/11.  7.8 on 1/15 as high as 8.5 on 1/19 but has drifted down to 7 yesterday and 5.6 this morning.  MCV is normal. Platelets 80k.  On the low side of range dating back to November. INR 1.7, up from 1.2 a few days ago. MELD 3.0: 27 at 04/26/2022  MELD-Na: 26 at 04/26/2022   Glucose 245, as high as 280 a few days ago.  RN reports a dark but not necessarily melenic stool this morning.  Previous days stools not described as bloody or melenic.  2 PRBCs ordered this morning.  Sandostatin and PPI drip initiated this morning. Current SBP's low 100s to 130s.  Heart rate low 100s to 1 teens.  Oxygen sats 92%.  No fever.  Home meds include famotidine 20 mg bid, Niferex, pantoprazole 40bid, rife axman, lactulose, Bactrim, furosemide 80, Aranesp.    Family history stroke, diabetes.  Colon cancer age 17 in his father, died age 68.     Past Medical History:  Diagnosis Date   A-fib [facility])    when initially stating dialysis january 2021 at [facility] per spouse; never heard anything else about it   Alcoholic cirrhosis [facility])    patient reports completing hep a and b vaccines in 2006   Anemia    has had 3 units of prbcs 2013   Anxiety    Arthritis    Asymptomatic gallstones    Ultrasound in 2006   B12 deficiency 11/21/2020   C. difficile colitis    Cholelithiasis    Chronic kidney disease    Dialysis M/W/F/Sa   Depression    Diabetes ([address])    Duodenal ulcer with hemorrhage    per patient in 2006 or 2007, records have been requested   Dyspnea    low oxygen level -  has O2 2 L all day   Esophageal varices ([place])    see PSH   GERD (gastroesophageal reflux disease)    Heart burn    History of alcohol abuse    quit 05/2013   HOH (hard of hearing)    Hypertension    Liver cirrhosis ([place])    ETOH   SBP (spontaneous bacterial peritonitis) ([place])    2020, November 2021.    Splenomegaly    Ultrasound in 2006    Past Surgical History:   Procedure Laterality Date   AGILE CAPSULE N/A 09/20/2019   Procedure: AGILE CAPSULE;  Surgeon: Daneil Dolin, MD;  [facility];  Service: Endoscopy;  Laterality: N/A;   APPENDECTOMY     AV FISTULA PLACEMENT Left 08/17/2019   Procedure: ARTERIOVENOUS (AV) FISTULA CREATION VERSES ARTERIOVENOUS GRAFT;  Surgeon: [name], MD;  Location: [facility];  Service: Vascular;  Laterality: Left;   [place] Left 09/28/2019   Procedure: LEFT SECOND STAGE [place];  Surgeon: Curt Jews  F, MD;  Location: [facility];  Service: Vascular;  Laterality: Left;   BIOPSY  02/03/2018   Procedure: BIOPSY;  Surgeon: Daneil Dolin, MD;  [facility];  Service: Endoscopy;;  bx of gastric polyps   BIOPSY  09/19/2019   Procedure: BIOPSY;  Surgeon: Danie Binder, MD;  [facility];  Service: Endoscopy;;   BIOPSY  01/16/2021   Procedure: BIOPSY;  Surgeon: Daneil Dolin, MD;  [facility];  Service: Endoscopy;;   CATARACT EXTRACTION Right    COLONOSCOPY  03/10/2005   Rectal polyp as described above, removed with snare. Left sided  diverticula. The remainder of the colonic mucosa appeared normal. Inflammed polyp on path.   COLONOSCOPY  04/1999   Dr. Thea Silversmith polyps removed,  Path showed hyperplastic   COLONOSCOPY  10/2015   Dr. Britta Mccreedy: diverticulosis, single sessile tubular adenoma 3-27m in size removed from descending colon.    COLONOSCOPY WITH ESOPHAGOGASTRODUODENOSCOPY (EGD)  03/31/2012   ROIN:OMVEHMCAVMs. Colonic diverticulosis. tubular adenoma colon   COLONOSCOPY WITH ESOPHAGOGASTRODUODENOSCOPY (EGD)  06/2019   FORSYTH: small esophageal varices without high risk stigmata, single large AVM on lesser curvature in gastric body s/p APC ablation, gastric antral and duodenal bulb polyposis. No significant source to explain transfusion dependent anemia. Colonoscopy with portal colopathy and diffuse edema, changes of Cdiff colitis on  colonoscopy   COLONOSCOPY WITH PROPOFOL N/A 01/19/2021   Surgeon: CDaryel November MD; 15 mm polyp in descending colon removed piecemeal, 6 mm polyp in ascending colon and 12 mm polyp in sigmoid colon not removed due to high bleeding risk, benign polypoid lesion at splenic flexure, diverticulosis in sigmoid and descending colon, rectal varices.   COLONOSCOPY WITH PROPOFOL N/A 01/16/2021   Surgeon: RDaneil Dolin MD;  Portal colopathy, large rectal varices, polypoid mass at splenic flexure biopsy, two 1 cm left colon polyps not removed, left-sided diverticulosis.  Reported patient had bleeding from splenic flexure lesion.  Pathology with acutely inflamed colonic mucosa with localized ulceration and prominent reactive changes.   ESOPHAGEAL BANDING N/A 02/03/2018   Procedure: ESOPHAGEAL BANDING;  Surgeon: RDaneil Dolin MD;  [facility];  Service: Endoscopy;  Laterality: N/A;   ESOPHAGEAL BANDING  01/16/2021   Procedure: ESOPHAGEAL BANDING;  Surgeon: RDaneil Dolin MD;  [facility];  Service: Endoscopy;;   ESOPHAGOGASTRODUODENOSCOPY  01/15/2005   Three columns grade 1 to 2 esophageal varices, otherwise normal esophageal mucosa.  Esophagus was not manipulated otherwise./Nodularity of the antrum with overlying erosions, nonspecific finding. Path showed rare H.pylori   ESOPHAGOGASTRODUODENOSCOPY  09/2008   Dr. RGaylene Brookscolumns of grade 2-3 esoph varices, only one column was prominent. Portal gastropathy, multiple gastrc polyps at antrum, two were 2cm with black eschar, bulbar polyps, bulbar erosions   ESOPHAGOGASTRODUODENOSCOPY  11/2004   Dr. FBrantley Stage3 esoph varices   ESOPHAGOGASTRODUODENOSCOPY  03/31/2012   RMR: 4 columns(3-GR 2, 1-GR1) non-bleeding esophageal varices, portal gastropathy, small HH, early GAVE, multiple gastric polyps    ESOPHAGOGASTRODUODENOSCOPY (EGD) WITH PROPOFOL N/A 02/03/2018   Dr. RGala Romney Esophageal varices, 3 columns grade 1-2.   Portal hypertensive gastropathy.  Multiple gastric polyps, biopsy consistent with hyperplastic.   ESOPHAGOGASTRODUODENOSCOPY (EGD) WITH PROPOFOL N/A 09/19/2019   Fields: grade I and II esophageal varcies, mild portal hypertensive gastropathy, moderate gastritis but no H. pylori, single hyperplastic gastric polyp removed, obvious source for melena/transfusion dependent anemia not identified, may be due to friable gastric and duodenal mucosa in the setting of portal hypertension  ESOPHAGOGASTRODUODENOSCOPY (EGD) WITH PROPOFOL N/A 01/16/2021   Surgeon: Daneil Dolin, MD; Portal hypertensive gastropathy, grade 2 esophageal varices with bleeding stigmata s/p banding x4, slight nodularity and congestion involving bulb and second portion of duodenum, nonspecific.  Recommended repeat EGD in 4 weeks.   ESOPHAGOGASTRODUODENOSCOPY (EGD) WITH PROPOFOL N/A 05/22/2021   stigmata of prior esophageal banding and no significant varices seen. Portal gastropathy. Polypoid gastric and duodenal mucosa with overlying erosions, marked mucosal friability.   ESOPHAGOGASTRODUODENOSCOPY (EGD) WITH PROPOFOL N/A 02/06/2022   Procedure: ESOPHAGOGASTRODUODENOSCOPY (EGD) WITH PROPOFOL;  Surgeon: Harvel Quale, MD;  [facility];  Service: Gastroenterology;  Laterality: N/A;   GASTRIC VARICES BANDING  03/31/2012   Procedure: GASTRIC VARICES BANDING;  Surgeon: Daneil Dolin, MD;  [facility];  Service: Endoscopy;  Laterality: N/A;   GIVENS CAPSULE STUDY N/A 09/21/2019   Poor study with a lot of debris obstructing much of the view of the first approximate 3 hours out of 6 hours.  No obvious source of bleeding identified.  Sequela of bleeding and old blood seen in the form of"whisps" of blood, blood flecks mostly toward the end of the study.   HEMOSTASIS CLIP PLACEMENT  01/19/2021   Procedure: HEMOSTASIS CLIP PLACEMENT;  Surgeon: [name], MD;  Location: [facility] ENDOSCOPY;  Service:  Gastroenterology;;   HEMOSTASIS CLIP PLACEMENT  02/06/2022   Procedure: HEMOSTASIS CLIP PLACEMENT;  Surgeon: Montez Morita, Quillian Quince, MD;  [facility];  Service: Gastroenterology;;  gastric body   HOT HEMOSTASIS  02/06/2022   Procedure: HOT HEMOSTASIS (ARGON PLASMA COAGULATION/BICAP);  Surgeon: Montez Morita, Quillian Quince, MD;  [facility];  Service: Gastroenterology;;   IR PARACENTESIS  06/18/2022   IR REMOVAL TUN CV CATH W/O FL  02/15/2020   POLYPECTOMY  09/19/2019   Procedure: POLYPECTOMY;  Surgeon: Danie Binder, MD;  [facility];  Service: Endoscopy;;   POLYPECTOMY  01/19/2021   Procedure: POLYPECTOMY;  Surgeon: [name], MD;  Location: [facility] ENDOSCOPY;  Service: Gastroenterology;;   UMBILICAL HERNIA REPAIR  2017   exploratory laparotomy, abdominal washout    Prior to Admission medications   Medication Sig Start Date End Date Taking? Authorizing Provider  acetaminophen (TYLENOL) 325 MG tablet Take 650 mg by mouth every 6 (six) hours as needed for mild pain, moderate pain, fever or headache.   Yes [provider]  allopurinol (ZYLOPRIM) 300 MG tablet Take 300 mg by mouth in the morning. 12/11/21  Yes [provider]  ALPRAZolam Duanne Moron) 0.5 MG tablet Take 1 tablet (0.5 mg total) by mouth See admin instructions. Take one tablet (0.5 mg) by mouth on Monday, Wednesday, Friday before dialysis, may also take one tablet (0.5 mg) daily as needed for anxiety. 02/07/22  Yes Tat, Shanon Brow, MD  ASCORBIC ACID PO Take 1 tablet by mouth in the morning. Vitamin C, unknown strength.   Yes [provider]  Calcium Carbonate Antacid (TUMS PO) Take 2 tablets by mouth with breakfast, with lunch, and with evening meal.   Yes [provider]  cetirizine (ZYRTEC) 10 MG tablet Take 10 mg by mouth in the morning. 06/24/21  Yes [provider]  Cholecalciferol (VITAMIN D-3 PO) Take 1 capsule by mouth in the morning.   Yes [provider]  diltiazem (DILACOR XR) 120 MG 24 hr capsule Take 120 mg by mouth in the morning. 05/19/22  Yes [provider]  famotidine (PEPCID) 20 MG tablet Take 1 tablet (20 mg total)  by mouth 2 (two) times daily. 07/30/21 07/30/22 Yes Barton Dubois, MD  furosemide (LASIX) 40 MG tablet Take 80 mg by mouth in the morning. 12/29/19  Yes [provider]  HUMALOG KWIKPEN 100 UNIT/ML KwikPen Inject 10-50 Units into the skin See admin instructions. Inject 10-50 units three times daily with meals per sliding scale. 04/23/21  Yes [provider]  HYDROcodone-acetaminophen (NORCO/VICODIN) 5-325 MG tablet Take 1 tablet by mouth daily as needed for severe pain.   Yes [provider]  hydrOXYzine (ATARAX/VISTARIL) 25 MG tablet Take 25 mg by mouth 3 (three) times daily. 11/23/20  Yes [provider]  iron polysaccharides (NIFEREX) 150 MG capsule Take 150 mg by mouth at bedtime.   Yes [provider]  Lactobacillus (ACIDOPHILUS PO) Take 1 capsule by mouth in the morning.   Yes [provider]  lactulose (CHRONULAC) 10 GM/15ML solution Take 15 mLs (10 g total) by mouth 2 (two) times daily. Patient taking differently: Take 10 g by mouth See admin instructions. 10g twice daily on Sunday, Tuesday, Thursday, Saturday 10g in the evening on Monday, Wednesday, Friday after dialysis. 02/07/22  Yes Tat, Shanon Brow, MD  metoprolol succinate (TOPROL-XL) 25 MG 24 hr tablet Take 1 tablet (25 mg total) by mouth daily. Patient taking differently: Take 25 mg by mouth See admin instructions. 25 mg twice daily on Sunday, Tuesday, Thursday, Saturday. 25 mg at bedtime on Monday, Wednesday, Friday (does not take before dialysis) 02/22/22  Yes Tat, Shanon Brow, MD  mirtazapine (REMERON) 30 MG tablet Take 1 tablet (30 mg total) by mouth at bedtime. 02/10/20  Yes Roxan Hockey, MD  Multiple Vitamin (MULTIVITAMIN WITH MINERALS) TABS tablet Take 1 tablet by mouth in the morning.    Yes  [provider]  nitroGLYCERIN (NITROGLYN) 2 % ointment Apply 2 inches topically once as needed for chest pain.   Yes [provider]  omega-3 acid ethyl esters (LOVAZA) 1 g capsule Take 2 g by mouth 2 (two) times daily.  04/07/19  Yes [provider]  ondansetron (ZOFRAN) 4 MG tablet Take 4 mg by mouth every 8 (eight) hours as needed for nausea or vomiting.   Yes [provider]  pantoprazole (PROTONIX) 40 MG tablet Take 1 tablet (40 mg total) by mouth 2 (two) times daily. 02/21/22  Yes Tat, Shanon Brow, MD  pravastatin (PRAVACHOL) 20 MG tablet Take 20 mg by mouth in the morning.    Yes [provider]  rifaximin (XIFAXAN) 550 MG TABS tablet Take 1 tablet (550 mg total) by mouth 2 (two) times daily. 05/02/22  Yes Emokpae, Courage, MD  SSD 1 % cream Apply 2 Applications topically 2 (two) times daily as needed (bed sores). 02/26/22  Yes [provider]  sulfamethoxazole-trimethoprim (BACTRIM DS) 800-160 MG tablet TAKE 1 TABLET BY MOUTH every friday AFTER dialysis Patient taking differently: Take 1 tablet by mouth See admin instructions. 1 tablet every Friday, after dialysis 03/12/22  Yes Annitta Needs, NP  thiamine (VITAMIN B-1) 100 MG tablet Take 100 mg by mouth in the morning. 08/27/20  Yes [provider]  tiZANidine (ZANAFLEX) 2 MG tablet Take 2 mg by mouth at bedtime as needed for muscle spasms. 04/07/22  Yes [provider]  TRESIBA FLEXTOUCH 200 UNIT/ML FlexTouch Pen Inject 10-50 Units into the skin See admin instructions. Inject 10-50 units once daily at bedtime as needed for high blood sugar levels, per sliding scale. 02/17/22  Yes [provider]  triamcinolone cream (KENALOG) 0.1 % Apply  1 application  topically 2 (two) times daily as needed (dry skin, itching). 12/30/17  Yes [provider]  venlafaxine (EFFEXOR) 75 MG tablet Take 75 mg by mouth 3 (three) times daily with meals.    Yes [provider]  zinc  sulfate 220 (50 Zn) MG capsule Take 1 capsule (220 mg total) by mouth daily. Patient taking differently: Take 220 mg by mouth in the morning. 05/03/22  Yes Roxan Hockey, MD    Scheduled Meds:  sodium chloride   Intravenous Once   allopurinol  100 mg Per Tube Q M,W,F-2000   Chlorhexidine Gluconate Cloth  6 each Topical Q0600   feeding supplement (PROSource TF20)  60 mL Per Tube Daily   insulin aspart  0-20 Units Subcutaneous Q4H   insulin aspart  2 Units Subcutaneous Q4H   iron polysaccharides  150 mg Per Tube Daily   lactulose  30 g Per Tube TID   midodrine  10 mg Oral TID WC   mirtazapine  30 mg Per Tube QHS   multivitamin  1 tablet Per Tube QHS   mouth rinse  15 mL Mouth Rinse Q2H   [START ON 06/27/2022] pantoprazole  40 mg Intravenous Q12H   pravastatin  20 mg Per Tube q AM   rifaximin  550 mg Per Tube BID   [START ON 06/26/2022] sulfamethoxazole-trimethoprim  1 tablet Per Tube Weekly   thiamine  100 mg Per Tube Daily   venlafaxine  75 mg Per Tube TID WC   Infusions:  sodium chloride Stopped (06/23/22 0834)   amiodarone 30 mg/hr (06/24/22 0700)   anticoagulant sodium citrate     dexmedetomidine (PRECEDEX) IV infusion 1.2 mcg/kg/hr (06/24/22 0700)   feeding supplement (VITAL 1.5 CAL) 25 mL/hr at 06/24/22 0700   norepinephrine (LEVOPHED) Adult infusion 4 mcg/min (06/24/22 0811)   octreotide (SANDOSTATIN) 500 mcg in sodium chloride 0.9 % 250 mL (2 mcg/mL) infusion     pantoprazole 80 mg (06/24/22 0803)   pantoprazole 8 mg/hr (06/24/22 0802)   PRN Meds: acetaminophen **OR** acetaminophen, albuterol, ALPRAZolam, alteplase, alum & mag hydroxide-simeth, anticoagulant sodium citrate, fentaNYL (SUBLIMAZE) injection, guaiFENesin-dextromethorphan, lidocaine (PF), lidocaine-prilocaine, ondansetron **OR** ondansetron (ZOFRAN) IV, mouth rinse, pentafluoroprop-tetrafluoroeth, polyethylene glycol, sodium chloride   Allergies as of 06/23/2022 - Reviewed 06/01/2022  Allergen Reaction Noted    Tape Itching, Rash, and Other (See Comments) 09/18/2019    Family History  Problem Relation Age of Onset   Colon cancer Father 13       deceased age 24   Breast cancer Sister        deceased   Diabetes Sister    Stroke Mother    Healthy Son    Healthy Daughter    Lung cancer Neg Hx    Ovarian cancer Neg Hx     Social History   Socioeconomic History   Marital status: Married    Spouse name: Not on file   Number of children: 2   Years of education: Not on file   Highest education level: Not on file  Occupational History   Occupation: RETIRED    Employer: SELF EMPLOYED  Tobacco Use   Smoking status: Former    Packs/day: 2.00    Years: 25.00    Total pack years: 50.00    Types: Cigarettes    Quit date: 01/28/1986    Years since quitting: 36.4   Smokeless tobacco: Never  Vaping Use   Vaping Use: Never used  Substance and Sexual Activity   Alcohol use:  No    Comment: quit in 05/2013   Drug use: No   Sexual activity: Not Currently  Other Topics Concern   Not on file  Social History Narrative   Two step children from second marriage (divorced) who live with him along with some grandchildren.    Social Determinants of Health   Financial Resource Strain: Not on file  Food Insecurity: No Food Insecurity (06/16/2022)   Hunger Vital Sign    Worried About Running Out of Food in the Last Year: Never true    Ran Out of Food in the Last Year: Never true  Transportation Needs: No Transportation Needs (06/16/2022)   PRAPARE - Hydrologist (Medical): No    Lack of Transportation (Non-Medical): No  Physical Activity: Inactive (05/30/2020)   Exercise Vital Sign    Days of Exercise per Week: 0 days    Minutes of Exercise per Session: 0 min  Stress: Not on file  Social Connections: Not on file  Intimate Partner Violence: Not At Risk (06/16/2022)   Humiliation, Afraid, Rape, and Kick questionnaire    Fear of Current or Ex-Partner: No     Emotionally Abused: No    Physically Abused: No    Sexually Abused: No    REVIEW OF SYSTEMS: Patient intubated, sedated, not able to perform ROS.  Pertinent details as per HPI.    PHYSICAL EXAM: Vital signs in last 24 hours: Vitals:   06/24/22 0727 06/24/22 0730  BP:  (!) 108/49  Pulse:  (!) 110  Resp:  (!) 21  Temp: 98.4 F (36.9 C)   SpO2:  92%   Wt Readings from Last 3 Encounters:  06/24/22 65.7 kg  05/01/22 79.6 kg  04/21/22 82.1 kg    General: Patient looks pale, ill, ashen coloring.  Intubated on vent. Head: No signs of head trauma. Eyes: Conjunctival pallor. Ears: Not able to assess hearing. Nose: No discharge. Mouth: No blood at the mouth.  Intubated. Neck: No JVD Lungs: Clear in front.  Intubated on vent without respiratory distress. Heart: Irregularly irregular, mildly rapid rate. Abdomen: Soft, not tender.  Old well-healed midline surgical scar..   Rectal: Not performed. Musc/Skeltl: No gross deformities. Extremities: The right foot is cool.  Both feet are wrapped in pressure ulcer protecting booties.  The left heel has pressure protecting bandages in place.  There are multiple wounds, skin tears on the upper extremities especially on the left arm. Neurologic: Unresponsive to exam. Skin: Skin tears as above. Tattoos: None noted.   Psych: Sedated.  Responsive.  Intake/Output from previous day: 01/23 0701 - 01/24 0700 In: 2235.7 [I.V.:1251.1; NG/GT:918.3; IV Piggyback:66.3] Out: 2000  Intake/Output this shift: No intake/output data recorded.  LAB RESULTS: Recent Labs    06/22/22 1808 06/23/22 0818 06/23/22 0926 06/24/22 0443  WBC 14.5* 18.8*  --  8.7  HGB 8.1* 7.0* 7.5* 5.6*  HCT 25.6* 21.9* 22.0* 17.0*  PLT 124* 130*  --  80*   BMET Lab Results  Component Value Date   NA 131 (L) 06/24/2022   NA 130 (L) 06/23/2022   NA 128 (L) 06/23/2022   K 3.5 06/24/2022   K 4.5 06/23/2022   K 4.3 06/23/2022   CL 94 (L) 06/24/2022   CL 90 (L)  06/23/2022   CL 92 (L) 06/22/2022   CO2 26 06/24/2022   CO2 24 06/23/2022   CO2 26 06/22/2022   GLUCOSE 245 (H) 06/24/2022   GLUCOSE 209 (H) 06/23/2022  GLUCOSE 193 (H) 06/22/2022   BUN 55 (H) 06/24/2022   BUN 114 (H) 06/23/2022   BUN 99 (H) 06/22/2022   CREATININE 2.74 (H) 06/24/2022   CREATININE 4.53 (H) 06/23/2022   CREATININE 4.06 (H) 06/22/2022   CALCIUM 8.5 (L) 06/24/2022   CALCIUM 9.0 06/23/2022   CALCIUM 9.0 06/22/2022   LFT Recent Labs    06/22/22 1808 06/23/22 0818 06/24/22 0443  ALBUMIN 2.2* 2.0* 2.5*   PT/INR Lab Results  Component Value Date   INR 1.6 (H) 04/24/2022   INR 1.5 (H) 04/23/2022   INR 1.3 (H) 04/22/2022   Hepatitis Panel No results for input(s): "HEPBSAG", "HCVAB", "HEPAIGM", "HEPBIGM" in the last 72 hours. C-Diff No components found for: "CDIFF" Lipase     Component Value Date/Time   LIPASE 22 07/28/2021 1307    Drugs of Abuse  No results found for: "LABOPIA", "COCAINSCRNUR", "LABBENZ", "AMPHETMU", "THCU", "LABBARB"   RADIOLOGY STUDIES: DG CHEST PORT 1 VIEW  Result Date: 06/23/2022 CLINICAL DATA:  Status post central line placement. EXAM: PORTABLE CHEST 1 VIEW COMPARISON:  June 22, 2022 FINDINGS: Since the prior study there is been interval right internal jugular venous catheter placement. Its distal tip is seen within the right atrium, approximately 2.4 cm distal to the junction of the right atrium and superior vena cava. Stable endotracheal tube and orogastric tube positioning is noted. The cardiac silhouette is mildly enlarged and unchanged in size. There is moderate severity calcification of the aortic arch. Mild, stable bibasilar airspace disease is seen with small, stable bilateral pleural effusions. No pneumothorax is identified. No acute osseous abnormalities are seen. IMPRESSION: Interval right internal jugular venous catheter placement positioning, as described above, without evidence of pneumothorax or additional interval  changes when compared to the prior study. Electronically Signed   By: Virgina Norfolk M.D.   On: 06/23/2022 03:29   DG Abd 1 View  Result Date: 06/22/2022 CLINICAL DATA:  G-tube EXAM: ABDOMEN - 1 VIEW COMPARISON:  01/19/2020 FINDINGS: Esophageal tube tip and side port overlie the proximal to mid stomach. Air-filled nondistended bowel in the upper abdomen. IMPRESSION: Esophageal tube tip overlies the proximal to mid stomach. Electronically Signed   By: Donavan Foil M.D.   On: 06/22/2022 18:28   DG CHEST PORT 1 VIEW  Result Date: 06/22/2022 CLINICAL DATA:  Acute on chronic respiratory failure EXAM: PORTABLE CHEST 1 VIEW COMPARISON:  06/22/2022, 06/19/2022, 04/22/2022. FINDINGS: Interval intubation, tip of the endotracheal tube is about 3.8 cm superior to carina. Esophageal tube tip below the diaphragm but incompletely visualized. Small bilateral effusions and basilar airspace disease similar compared to prior. Stable cardiomediastinal silhouette with slight central congestion. No visible pneumothorax. IMPRESSION: 1. Interval intubation with tip of the endotracheal tube about 3.8 cm superior to carina. 2. No change in small bilateral effusions and basilar airspace disease. Cardiomegaly with slight central congestion. Electronically Signed   By: Donavan Foil M.D.   On: 06/22/2022 18:26   DG CHEST PORT 1 VIEW  Result Date: 06/22/2022 CLINICAL DATA:  hypoxic EXAM: PORTABLE CHEST 1 VIEW COMPARISON:  06/19/22 and older FINDINGS: Tiny pleural effusions. Mild adjacent opacity. No pneumothorax. Stable enlarged cardiopericardial silhouette. Slight vascular congestion. Overlapping cardiac leads. Degenerative changes seen in the along the shoulders and spine with osteopenia IMPRESSION: Tiny pleural effusions and adjacent opacities.  No pneumothorax. Electronically Signed   By: Jill Side M.D.   On: 06/22/2022 14:37      IMPRESSION:     Decompensated cirrhosis.  Acute recurrent on  chronic blood loss  anemia.  Dark stool but no recent FOBT testing.  Extensive workup over recent years as per HPI.  History esophageal and rectal varices.  History of ulcer disease.  History colon polyps.  At latest EGD 01/2022 had oozing from either an AVM or PHG treated with APC, Endo Clip as well as none bleeding grade 1 esophageal varices, portal hypertensive gastropathy.  At colonoscopy 12/2021 had removal of large polyp and polypectomy site clipped.  Other colon polyps were not removed, additionally had nonbleeding rectal varices.  Respiratory failure, flu positive.  Intubated on vent.  A-fib, RVR.  History paracentesis requiring ascites, SBP.  On Septra for SBP prophylaxis.  Coagulopathy.    PLAN:     EGD, timing per Dr. Silverio Decamp.  Orders entered into [facility].  Continue current measures.  Follow CBC.   Azucena Freed  06/24/2022, 8:15 AM Phone [phone]   Attending physician's note  I have taken a history, reviewed the chart and examined the patient. I performed a substantive portion of this encounter, including complete performance of at least one of the key components, in conjunction with the APP. I agree with the APP's note, impression and recommendations.    77 year old male with EtOH cirrhosis decompensated, chronic respiratory failure, end-stage renal disease on dialysis hospitalized since January 14 with flu pneumonia Is currently intubated, on Levophed and amiodarone gtt. for A-fib with RVR  Noted decline in hemoglobin, had 1 episode of dark bowel movement but no melena or rectal bleeding He is on octreotide and PPI gtt.  Difficult IV access, has not received blood transfusion  Decompensated cirrhosis with ascites, SBP, portal hypertensive gastropathy, esophageal and rectal varices  MELD 3.0: 27 at 04/26/2022  4:12 AM MELD-Na: 26 at 04/26/2022  4:12 AM Calculated from: Serum Creatinine: 2.90 mg/dL at 04/26/2022  4:12 AM Serum Sodium: 131 mmol/L at 04/26/2022  4:12 AM Total  Bilirubin: 0.8 mg/dL (Using min of 1 mg/dL) at 04/26/2022  4:12 AM Serum Albumin: 2.2 g/dL at 04/26/2022  4:12 AM INR(ratio): 1.6 at 04/24/2022 10:12 AM Age at listing (hypothetical): 76 years Sex: Male at 04/26/2022  4:12 AM  Overall poor prognosis, not a candidate for transplant or TIPS He had multiple endoscopic evaluations, most recent September 2023  Transfuse to hemoglobin 7 We will plan for EGD only for therapeutic intervention if he has signs of ongoing GI bleed, do not recommend diagnostic exam Hold tube feeds after midnight  Hepatic encephalopathy: Lactulose 30 g 3 times daily through NG tube  Goals of care discussion with palliative care    Damaris Hippo , MD [phone]

## 2022-06-24 NOTE — Procedures (Signed)
HD Note:  Some information was entered later than the data was gathered due to patient care needs. The stated time with the data is accurate.  Informed consent signed and in chart.   Treatment performed at bedside.  Patient on ventilator and occasional opens his eyes during initiation of treatment.    Patient required extensive personal care and became alert at that time.  He was cooperative with what he was asked to do to facilitate cleaning.  Access used: Left upper arm fistual Access issues: No issues   The orders were to keep the fluid even for the treatment.  The UF was zero.   [facility]

## 2022-06-24 NOTE — Progress Notes (Signed)
PT Cancellation Note  Patient Details Name: FARREL GUIMOND MRN: [id number] DOB: 08-19-1945   Cancelled Treatment:    Reason Eval/Treat Not Completed: Patient at procedure or test/unavailable - pt with plans for HD this afternoon, RN request hold until tomorrow. Will check back.   Stacie Glaze, PT DPT Acute Rehabilitation Services Pager [phone]  Office [phone]    Louis Matte 06/24/2022, 1:48 PM

## 2022-06-24 NOTE — Inpatient Diabetes Management (Addendum)
Inpatient Diabetes Program Recommendations  AACE/ADA: New Consensus Statement on Inpatient Glycemic Control   Target Ranges:  Prepandial:   less than 140 mg/dL      Peak postprandial:   less than 180 mg/dL (1-2 hours)      Critically ill patients:  140 - 180 mg/dL    Latest Reference Range & Units 06/24/22 03:18 06/24/22 07:21  Glucose-Capillary 70 - 99 mg/dL 196 (H) 258 (H)    Latest Reference Range & Units 06/23/22 02:09 06/23/22 07:27 06/23/22 11:22 06/23/22 15:20 06/23/22 19:11 06/23/22 23:00 06/23/22 23:08  Glucose-Capillary 70 - 99 mg/dL 196 (H) 192 (H) 169 (H) 200 (H) 161 (H) 158 (H) 168 (H)    Review of Glycemic Control  Diabetes history: DM2 Outpatient Diabetes medications: Toujeo 120 units QHS , Humalog 50 units TID Current orders for Inpatient glycemic control: Novolog 0-20 units Q4H, Novolog 2 units Q4H; Vital @ 55 ml/hr   Inpatient Diabetes Program Recommendations:    Insulin: Please consider increasing tube feeding coverage to Novolog 4 units Q4H.  Thanks, Barnie Alderman, RN, MSN, [place] Diabetes Coordinator Inpatient Diabetes Program [phone] (Team Pager from 8am to [place])

## 2022-06-24 NOTE — Progress Notes (Signed)
[facility] Progress Note   Subjective:  had HD yesterday in ICU w/ 2 L UF. Is on levo gtt at 4-10 ug/min.    Objective Vitals:   06/24/22 1004 06/24/22 1015 06/24/22 1030 06/24/22 1036  BP:  (!) 144/42 (!) 146/45   Pulse: (!) 105 73 73   Resp: '20 17 17   '$ Temp:   98.4 F (36.9 C) 98.4 F (36.9 C)  TempSrc:   Axillary Oral  SpO2: 98% 98% 98%   Weight:      Height:       Physical Exam General: chronically ill appearing HEENT: orally intubated  Heart: RRR  Lungs: BS decreased,  Abdomen: soft, NT Extremities: no LE edema, 1+ UE edema Dialysis Access: LU AVF +b/t   Dialysis Orders: [facility] MWF   4h  80.5kg  350/500  2/2.5 bath  AVF  Hep none  Mircera 200 (due 1/15)      CXR 1/23 - Mild, stable bibasilar airspace disease is seen with small, stable bilateral pleural effusions  Assessment/Plan: Dyspnea/respiratory distress -secondary to volume overload/Influenza A +--initially requiring BiPAP on admit. Improved some with HD but still with increased O2 requirement. 2L [place] at baseline. CXR 1/19 with interstitial edema. Respiratory decompensation again on 1/22. Required BiPAP -->intubation + pressor support. Per PCCM.  Encephalopathy - metabolic/hepatic encephalopathy.  Lactulose/rfaximin per primary.  ESRD -  HD MWF. HD yesterday off schedule. Plan HD again today in ICU.  Shock/ hypotension - on pressor support and midodrine 10 tid per tube Volume - wt's down 5-6 kg here and overall is 15kg under dry wt. Doubt there is any excess volume left. Euvolemic on exam. Keep even w/ HD today.  Anemia  - Hb trend down s/p transfusion on 1/11. Aranesp 200 q Monday (did not get 1/22 -will reorder)  Metabolic bone disease -  Ca and phos in goal. No VDRA as outpatient  Nutrition - Renal diet with fluid restriction.  Hx of cirrhosis with ascites - per primary team.   Had paracentesis with 2.3L off 1/19.   Flu A + - completed course of Tamiflu   Kelly Splinter, MD 06/24/2022,  10:56 AM  Recent Labs  Lab 06/23/22 0818 06/23/22 0926 06/23/22 2027 06/24/22 0443  HGB 7.0* 7.5*  --  5.6*  ALBUMIN 2.0*  --   --  2.5*  CALCIUM 9.0  --   --  8.5*  PHOS 2.5  --  1.7* 1.9*  1.8*  CREATININE 4.53*  --   --  2.74*  K 4.3 4.5  --  3.5    Inpatient medications:  allopurinol  100 mg Per Tube Q M,W,F-2000   Chlorhexidine Gluconate Cloth  6 each Topical Q0600   feeding supplement (PROSource TF20)  60 mL Per Tube Daily   insulin aspart  0-20 Units Subcutaneous Q4H   insulin aspart  2 Units Subcutaneous Q4H   iron polysaccharides  150 mg Per Tube Daily   lactulose  30 g Per Tube TID   midodrine  10 mg Per Tube TID WC   mirtazapine  30 mg Per Tube QHS   multivitamin  1 tablet Per Tube QHS   mouth rinse  15 mL Mouth Rinse Q2H   [START ON 06/27/2022] pantoprazole  40 mg Intravenous Q12H   phosphorus  250 mg Per Tube Once   pravastatin  20 mg Per Tube q AM   rifaximin  550 mg Per Tube BID   [START ON 06/26/2022] sulfamethoxazole-trimethoprim  1  tablet Per Tube Weekly   thiamine  100 mg Per Tube Daily   venlafaxine  75 mg Per Tube TID WC    sodium chloride Stopped (06/23/22 0834)   amiodarone 30 mg/hr (06/24/22 1000)   anticoagulant sodium citrate     dexmedetomidine (PRECEDEX) IV infusion 1.2 mcg/kg/hr (06/24/22 1004)   feeding supplement (VITAL 1.5 CAL) 35 mL/hr at 06/24/22 1000   norepinephrine (LEVOPHED) Adult infusion 4 mcg/min (06/24/22 1000)   octreotide (SANDOSTATIN) 500 mcg in sodium chloride 0.9 % 250 mL (2 mcg/mL) infusion 50 mcg/hr (06/24/22 1000)   pantoprazole Stopped (06/24/22 0807)   potassium PHOSPHATE IVPB (in mmol)     acetaminophen **OR** acetaminophen, albuterol, ALPRAZolam, alteplase, alum & mag hydroxide-simeth, anticoagulant sodium citrate, fentaNYL (SUBLIMAZE) injection, guaiFENesin-dextromethorphan, lidocaine (PF), lidocaine-prilocaine, ondansetron **OR** ondansetron (ZOFRAN) IV, mouth rinse, pentafluoroprop-tetrafluoroeth, polyethylene  glycol, sodium chloride

## 2022-06-24 NOTE — Progress Notes (Addendum)
[facility] Progress Note Patient Name: Jeffrey [name] DOB: 1945/06/11 MRN: [id number]   Date of Service  06/24/2022  HPI/Events of Note  Hemoglobin 5.6 gm / dl, patient had a melanotic bowel movement.  eICU Interventions  Transfuse 2 units of PRBC, Protonix bid, GI  consultation, check coagulation parameters, Octreotide gtt.        [name] 06/24/2022, 6:27 AM

## 2022-06-25 ENCOUNTER — Inpatient Hospital Stay: Payer: Medicare Other

## 2022-06-25 ENCOUNTER — Inpatient Hospital Stay (HOSPITAL_COMMUNITY): Payer: Medicare Other

## 2022-06-25 ENCOUNTER — Encounter (HOSPITAL_COMMUNITY): Admission: EM | Disposition: E | Payer: Self-pay | Source: Other Acute Inpatient Hospital | Attending: Internal Medicine

## 2022-06-25 ENCOUNTER — Ambulatory Visit: Payer: Medicare Other

## 2022-06-25 DIAGNOSIS — K652 Spontaneous bacterial peritonitis: Secondary | ICD-10-CM

## 2022-06-25 DIAGNOSIS — K7031 Alcoholic cirrhosis of liver with ascites: Secondary | ICD-10-CM | POA: Diagnosis not present

## 2022-06-25 DIAGNOSIS — J9621 Acute and chronic respiratory failure with hypoxia: Secondary | ICD-10-CM | POA: Diagnosis not present

## 2022-06-25 DIAGNOSIS — R195 Other fecal abnormalities: Secondary | ICD-10-CM | POA: Diagnosis not present

## 2022-06-25 LAB — CBC WITH DIFFERENTIAL/PLATELET
Abs Immature Granulocytes: 0.04 10*3/uL (ref 0.00–0.07)
Basophils Absolute: 0 10*3/uL (ref 0.0–0.1)
Basophils Relative: 0 %
Eosinophils Absolute: 0 10*3/uL (ref 0.0–0.5)
Eosinophils Relative: 0 %
HCT: 25 % — ABNORMAL LOW (ref 39.0–52.0)
Hemoglobin: 8.5 g/dL — ABNORMAL LOW (ref 13.0–17.0)
Immature Granulocytes: 1 %
Lymphocytes Relative: 1 %
Lymphs Abs: 0.1 10*3/uL — ABNORMAL LOW (ref 0.7–4.0)
MCH: 32.4 pg (ref 26.0–34.0)
MCHC: 34 g/dL (ref 30.0–36.0)
MCV: 95.4 fL (ref 80.0–100.0)
Monocytes Absolute: 0.2 10*3/uL (ref 0.1–1.0)
Monocytes Relative: 3 %
Neutro Abs: 6.9 10*3/uL (ref 1.7–7.7)
Neutrophils Relative %: 95 %
Platelets: 53 10*3/uL — ABNORMAL LOW (ref 150–400)
RBC: 2.62 MIL/uL — ABNORMAL LOW (ref 4.22–5.81)
RDW: 21.2 % — ABNORMAL HIGH (ref 11.5–15.5)
WBC: 7.2 10*3/uL (ref 4.0–10.5)
nRBC: 2.2 % — ABNORMAL HIGH (ref 0.0–0.2)

## 2022-06-25 LAB — GLUCOSE, CAPILLARY
Glucose-Capillary: 226 mg/dL — ABNORMAL HIGH (ref 70–99)
Glucose-Capillary: 194 mg/dL — ABNORMAL HIGH (ref 70–99)
Glucose-Capillary: 147 mg/dL — ABNORMAL HIGH (ref 70–99)
Glucose-Capillary: 49 mg/dL — ABNORMAL LOW (ref 70–99)
Glucose-Capillary: 67 mg/dL — ABNORMAL LOW (ref 70–99)
Glucose-Capillary: 84 mg/dL (ref 70–99)
Glucose-Capillary: 101 mg/dL — ABNORMAL HIGH (ref 70–99)
Glucose-Capillary: 141 mg/dL — ABNORMAL HIGH (ref 70–99)
Glucose-Capillary: 172 mg/dL — ABNORMAL HIGH (ref 70–99)

## 2022-06-25 LAB — CBC
HCT: 25.8 % — ABNORMAL LOW (ref 39.0–52.0)
Hemoglobin: 8.5 g/dL — ABNORMAL LOW (ref 13.0–17.0)
MCH: 31.3 pg (ref 26.0–34.0)
MCHC: 32.9 g/dL (ref 30.0–36.0)
MCV: 94.9 fL (ref 80.0–100.0)
Platelets: 57 10*3/uL — ABNORMAL LOW (ref 150–400)
RBC: 2.72 MIL/uL — ABNORMAL LOW (ref 4.22–5.81)
RDW: 20.8 % — ABNORMAL HIGH (ref 11.5–15.5)
WBC: 7.4 10*3/uL (ref 4.0–10.5)
nRBC: 2.2 % — ABNORMAL HIGH (ref 0.0–0.2)

## 2022-06-25 LAB — TYPE AND SCREEN
ABO/RH(D): O POS
Antibody Screen: NEGATIVE
Unit division: 0
Unit division: 0

## 2022-06-25 LAB — TECHNOLOGIST SMEAR REVIEW: Plt Morphology: NORMAL

## 2022-06-25 LAB — RENAL FUNCTION PANEL
Albumin: 2.2 g/dL — ABNORMAL LOW (ref 3.5–5.0)
Anion gap: 10 (ref 5–15)
BUN: 28 mg/dL — ABNORMAL HIGH (ref 8–23)
CO2: 26 mmol/L (ref 22–32)
Calcium: 7.8 mg/dL — ABNORMAL LOW (ref 8.9–10.3)
Chloride: 95 mmol/L — ABNORMAL LOW (ref 98–111)
Creatinine, Ser: 1.79 mg/dL — ABNORMAL HIGH (ref 0.61–1.24)
GFR, Estimated: 39 mL/min — ABNORMAL LOW (ref >60)
Glucose, Bld: 187 mg/dL — ABNORMAL HIGH (ref 70–99)
Phosphorus: 2 mg/dL — ABNORMAL LOW (ref 2.5–4.6)
Potassium: 3.5 mmol/L (ref 3.5–5.1)
Sodium: 131 mmol/L — ABNORMAL LOW (ref 135–145)

## 2022-06-25 LAB — BPAM RBC
Blood Product Expiration Date: 202402132359
Blood Product Expiration Date: 202402232359
ISSUE DATE / TIME: 202401241010
ISSUE DATE / TIME: 202401241157
Unit Type and Rh: 5100
Unit Type and Rh: 5100

## 2022-06-25 LAB — FERRITIN: Ferritin: 515 ng/mL — ABNORMAL HIGH (ref 24–336)

## 2022-06-25 LAB — MAGNESIUM: Magnesium: 2.3 mg/dL (ref 1.7–2.4)

## 2022-06-25 SURGERY — ESOPHAGOGASTRODUODENOSCOPY (EGD) WITH PROPOFOL
Anesthesia: Monitor Anesthesia Care

## 2022-06-25 MED ORDER — DARBEPOETIN ALFA 200 MCG/0.4ML IJ SOSY
200.0000 ug | PREFILLED_SYRINGE | INTRAMUSCULAR | Status: DC
  Administered 2022-06-30: 200 ug via SUBCUTANEOUS
  Filled 2022-06-25: qty 0.4

## 2022-06-25 MED ORDER — ACETAMINOPHEN 325 MG PO TABS: 650.0000 mg | ORAL_TABLET | Freq: Four times a day (QID) | ORAL | Status: DC | PRN

## 2022-06-25 MED ORDER — POLYETHYLENE GLYCOL 3350 17 G PO PACK: 17.0000 g | PACK | Freq: Every day | ORAL | Status: DC | PRN

## 2022-06-25 MED ORDER — PRAVASTATIN SODIUM 10 MG PO TABS
20.0000 mg | ORAL_TABLET | Freq: Every morning | ORAL | Status: DC
  Filled 2022-06-25: qty 2

## 2022-06-25 MED ORDER — POLYSACCHARIDE IRON COMPLEX 150 MG PO CAPS
150.0000 mg | ORAL_CAPSULE | Freq: Every day | ORAL | Status: DC
  Filled 2022-06-25: qty 1

## 2022-06-25 MED ORDER — CHLORHEXIDINE GLUCONATE CLOTH 2 % EX PADS: 6.0000 | MEDICATED_PAD | Freq: Every day | CUTANEOUS | Status: DC

## 2022-06-25 MED ORDER — POTASSIUM CHLORIDE 20 MEQ PO PACK: 40.0000 meq | PACK | Freq: Once | ORAL | Status: DC

## 2022-06-25 MED ORDER — MIDODRINE HCL 5 MG PO TABS: 5.0000 mg | ORAL_TABLET | Freq: Three times a day (TID) | ORAL | Status: DC

## 2022-06-25 MED ORDER — DEXTROSE 50 % IV SOLN
INTRAVENOUS | Status: AC
  Administered 2022-06-25: 25 mL
  Filled 2022-06-25: qty 50

## 2022-06-25 MED ORDER — GUAIFENESIN-DM 100-10 MG/5ML PO SYRP: 5.0000 mL | ORAL_SOLUTION | ORAL | Status: DC | PRN

## 2022-06-25 MED ORDER — ACETAMINOPHEN 650 MG RE SUPP: 650.0000 mg | Freq: Four times a day (QID) | RECTAL | Status: DC | PRN

## 2022-06-25 MED ORDER — MIDODRINE HCL 5 MG PO TABS
5.0000 mg | ORAL_TABLET | Freq: Three times a day (TID) | ORAL | Status: DC
  Administered 2022-06-25 (×2): 5 mg
  Filled 2022-06-25 (×2): qty 1

## 2022-06-25 MED ORDER — K PHOS MONO-SOD PHOS DI & MONO 155-852-130 MG PO TABS
250.0000 mg | ORAL_TABLET | Freq: Once | ORAL | Status: AC
  Administered 2022-06-25: 250 mg
  Filled 2022-06-25: qty 1

## 2022-06-25 MED ORDER — RIFAXIMIN 550 MG PO TABS: 550.0000 mg | ORAL_TABLET | Freq: Two times a day (BID) | ORAL | Status: DC

## 2022-06-25 MED ORDER — THIAMINE MONONITRATE 100 MG PO TABS: 100.0000 mg | ORAL_TABLET | Freq: Every day | ORAL | Status: DC

## 2022-06-25 MED ORDER — AMIODARONE HCL 200 MG PO TABS: 100.0000 mg | ORAL_TABLET | Freq: Every day | ORAL | Status: DC

## 2022-06-25 MED ORDER — INSULIN ASPART 100 UNIT/ML IJ SOLN
4.0000 [IU] | INTRAMUSCULAR | Status: DC
  Administered 2022-06-25: 4 [IU] via SUBCUTANEOUS

## 2022-06-25 MED ORDER — ALUM & MAG HYDROXIDE-SIMETH 200-200-20 MG/5ML PO SUSP: 15.0000 mL | ORAL | Status: DC | PRN

## 2022-06-25 MED ORDER — PANTOPRAZOLE SODIUM 40 MG IV SOLR
40.0000 mg | Freq: Two times a day (BID) | INTRAVENOUS | Status: DC
  Administered 2022-06-25 – 2022-06-30 (×11): 40 mg via INTRAVENOUS
  Filled 2022-06-25 (×11): qty 10

## 2022-06-25 MED ORDER — RENA-VITE PO TABS: 1.0000 | ORAL_TABLET | Freq: Every day | ORAL | Status: DC

## 2022-06-25 MED ORDER — LACTULOSE 10 GM/15ML PO SOLN: 30.0000 g | Freq: Three times a day (TID) | ORAL | Status: DC

## 2022-06-25 MED ORDER — VENLAFAXINE HCL 75 MG PO TABS
75.0000 mg | ORAL_TABLET | Freq: Three times a day (TID) | ORAL | Status: DC
  Filled 2022-06-25 (×3): qty 1

## 2022-06-25 MED ORDER — ALPRAZOLAM 0.5 MG PO TABS: 0.5000 mg | ORAL_TABLET | Freq: Every day | ORAL | Status: DC | PRN

## 2022-06-25 MED ORDER — MIRTAZAPINE 15 MG PO TABS: 30.0000 mg | ORAL_TABLET | Freq: Every day | ORAL | Status: DC

## 2022-06-25 MED ORDER — ALLOPURINOL 100 MG PO TABS: 100.0000 mg | ORAL_TABLET | ORAL | Status: DC

## 2022-06-25 NOTE — Progress Notes (Signed)
Occupational Therapy Treatment Patient Details Name: Jeffrey [name] MRN: [id number] DOB: August 27, 1945 Today's Date: 06/13/2022   History of present illness 77 yo male admitted 06/11/2022 due to SOB/acute respiratory distress found to be positive for flu.1/22 respiratory distress and hypotensions during HD requiring intubation. PMH: DM, HTN, GIB, chronic resp failure on 2 L, Afib, ESRD on HD MWF, cirrhosis requiring periodic paracentesis.   OT comments  Patient seen with PT to address bed mobility, EOB sitting balance, and standing. Patietn able to perform 5 stands from EOB with mod assist of 2 while attempting to side step to [facility], clean bottom, and change bed pads. Patient attempting to communicate with pen and paper but unable to completely perform. Patient on PRVC 40% with peep5, ETT at 24 cm and SpO2 97%, and HR at 82. Discharge recommendations changed to AIR due to increased assistance needed and would benefit from continued rehab to address bed mobility, transfers, and self care. Acute OT to continue to follow.    Recommendations for follow up therapy are one component of a multi-disciplinary discharge planning process, led by the attending physician.  Recommendations may be updated based on patient status, additional functional criteria and insurance authorization.    Follow Up Recommendations  Acute inpatient rehab (3hours/day)     Assistance Recommended at Discharge Frequent or constant Supervision/Assistance  Patient can return home with the following  A lot of help with walking and/or transfers;A lot of help with bathing/dressing/bathroom;Assistance with cooking/housework;Help with stairs or ramp for entrance;Assist for transportation   Equipment Recommendations  None recommended by OT    Recommendations for Other Services      Precautions / Restrictions Precautions Precautions: Fall Precaution Comments: ETT, vent, flexiseal, cortrak, bil wrist restraints and mittens       Mobility  Bed Mobility Overal bed mobility: Needs Assistance Bed Mobility: Supine to Sit     Supine to sit: HOB elevated, Mod assist     General bed mobility comments: HOB 30 degrees with mod assist to pivot to EOB and elevate trunk. Min assist +2 to return to supine to clear legs and manage lines    Transfers Overall transfer level: Needs assistance   Transfers: Sit to/from Stand Sit to Stand: Mod assist, +2 physical assistance, +2 safety/equipment           General transfer comment: stood x5 with mod assist of 2 with pad changed and cleaned bottom on separate stands while attempting to get close to [place] Overall balance assessment: Needs assistance Sitting-balance support: No upper extremity supported, Feet supported Sitting balance-Leahy Scale: Fair Sitting balance - Comments: static sitting with guarding for safety   Standing balance support: Bilateral upper extremity supported, During functional activity Standing balance-Leahy Scale: Poor Standing balance comment: bil UE support and knees blocked in standing                           ADL either performed or assessed with clinical judgement   ADL Overall ADL's : Needs assistance/impaired             Lower Body Bathing: Total assistance;Sit to/from stand Lower Body Bathing Details (indicate cue type and reason): cleaned bottom while standing from EOB     Lower Body Dressing: Total assistance;Bed level Lower Body Dressing Details (indicate cue type and reason): to donn socks               General ADL Comments: focused  on bed mobiltiy and standing from EOB    Extremity/Trunk Assessment              Vision       Perception     Praxis      Cognition Arousal/Alertness: Awake/alert Behavior During Therapy: Flat affect Overall Cognitive Status: Difficult to assess                                 General Comments: able to follow one step commands        Exercises       Shoulder Instructions       General Comments      Pertinent Vitals/ Pain       Pain Assessment Pain Assessment: No/denies pain Pain Intervention(s): Monitored during session  Home Living                                          Prior Functioning/Environment              Frequency  Min 2X/week        Progress Toward Goals  OT Goals(current goals can now be found in the care plan section)  Progress towards OT goals: Progressing toward goals  Acute Rehab OT Goals OT Goal Formulation: Patient unable to participate in goal setting Time For Goal Achievement: 07/02/22 Potential to Achieve Goals: Good ADL Goals Pt Will Perform Lower Body Bathing: with min assist;sitting/lateral leans;sit to/from stand Pt Will Perform Lower Body Dressing: with min assist;sitting/lateral leans;sit to/from stand Pt Will Transfer to Toilet: with supervision;ambulating;regular height toilet Pt Will Perform Toileting - Clothing Manipulation and hygiene: with min assist;sitting/lateral leans;sit to/from stand Pt/caregiver will Perform Home Exercise Program: Increased strength;Both right and left upper extremity;With Supervision;With written HEP provided  Plan Discharge plan needs to be updated    Co-evaluation                 AM-PAC OT "6 Clicks" Daily Activity     Outcome Measure   Help from another person eating meals?: A Lot Help from another person taking care of personal grooming?: A Lot Help from another person toileting, which includes using toliet, bedpan, or urinal?: Total Help from another person bathing (including washing, rinsing, drying)?: A Lot Help from another person to put on and taking off regular upper body clothing?: A Lot Help from another person to put on and taking off regular lower body clothing?: Total 6 Click Score: 10    End of Session Equipment Utilized During Treatment: Other (comment) (on vent)  OT Visit Diagnosis: Muscle  weakness (generalized) (M62.81);Unsteadiness on feet (R26.81)   Activity Tolerance Patient tolerated treatment well   Patient Left in bed;with call bell/phone within reach;with restraints reapplied   Nurse Communication Mobility status        Time: 3754-3606 OT Time Calculation (min): 27 min  Charges: OT General Charges $OT Visit: 1 Visit OT Treatments $Therapeutic Activity: 8-22 mins  Lodema Hong, [place]  Office [place] 06/17/2022, 11:42 AM

## 2022-06-25 NOTE — Progress Notes (Addendum)
[facility] Progress Note  CC:  GIB, cirrhosis  Subjective:  Patient awake on vent trying to talk and communicate.  Dark brown liquid stool in flexiseal bag.  Objective:  Vital signs in last 24 hours: Temp:  [97.7 F (36.5 C)-98.6 F (37 C)] 98 F (36.7 C) (01/25 0700) Pulse Rate:  [62-107] 78 (01/25 0700) Resp:  [15-33] 21 (01/25 0726) BP: (80-172)/(42-86) 148/60 (01/25 0700) SpO2:  [85 %-100 %] 99 % (01/25 0700) FiO2 (%):  [40 %] 40 % (01/25 0726) Last BM Date : 06/24/22 General:  Alert, chronically ill-appearing in NAD Heart:  Regular rate and rhythm; no murmurs Pulm:  CTAB.  No W/R/R. Abdomen:  Soft, non-distended.  BS present.  Non-tender. Extremities:  Without edema.  Intake/Output from previous day: 01/24 0701 - 01/25 0700 In: 3326.3 [I.V.:1358.1; Blood:784.3; NG/GT:1011.5; IV Piggyback:172.4] Out: 175 [Stool:175]  Lab Results: Recent Labs    06/24/22 0443 06/24/22 1640 06/20/2022 0440  WBC 8.7 6.8 7.4  HGB 5.6* 8.0* 8.5*  HCT 17.0* 23.7* 25.8*  PLT 80* 60* 57*   BMET Recent Labs    06/23/22 0818 06/23/22 0926 06/24/22 0443 06/02/2022 0440  NA 128* 130* 131* 131*  K 4.3 4.5 3.5 3.5  CL 90*  --  94* 95*  CO2 24  --  26 26  GLUCOSE 209*  --  245* 187*  BUN 114*  --  55* 28*  CREATININE 4.53*  --  2.74* 1.79*  CALCIUM 9.0  --  8.5* 7.8*   LFT Recent Labs    06/14/2022 0440  ALBUMIN 2.2*   PT/INR Recent Labs    06/24/22 0759  LABPROT 19.7*  INR 1.7*   DG CHEST PORT 1 VIEW  Result Date: 06/03/2022 CLINICAL DATA:  Hypoxia EXAM: PORTABLE CHEST 1 VIEW COMPARISON:  06/23/2022 FINDINGS: Interstitial changes and bibasilar consolidation lower lungs most likely pulmonary edema. Atypical infection not excluded. No pneumothorax. Small bilateral pleural effusions. Endotracheal tube tip 3 cm above carina. Right IJ CVC tip overlies right atrium. NG tube tip below the diaphragm and off x-ray. IMPRESSION: Pulmonary vascular congestion.  Small  pleural effusions. Electronically Signed   By: Sammie Bench M.D.   On: 06/01/2022 09:17    Assessment / Plan: Decompensated cirrhosis   Acute recurrent on chronic blood loss anemia.  Dark stool but no recent FOBT testing.  Extensive workup over recent years as per HPI in consult note.  History grade esophageal and rectal varices.  History of ulcer disease.  History colon polyps.  At latest EGD 01/2022 had oozing from either an AVM or PHG treated with APC, Endo Clip (thought most likely was PHG) as well as non bleeding grade 1 esophageal varices, portal hypertensive gastropathy.  At colonoscopy 12/2020 had removal of large polyp and polypectomy site clipped.  Other colon polyps were not removed, additionally had nonbleeding rectal varices.  Hgb stable at 8.5 grams this AM.   Respiratory failure, flu positive.  Intubated on vent.   A-fib, RVR.   History paracentesis requiring ascites, SBP.  On Septra for SBP prophylaxis.   Coagulopathy with INR 1.7  ESRD on dialysis  MELD 3.0: 27 at 04/26/2022  4:12 AM MELD-Na: 26 at 04/26/2022  4:12 AM Calculated from: Serum Creatinine: 2.90 mg/dL at 04/26/2022  4:12 AM Serum Sodium: 131 mmol/L at 04/26/2022  4:12 AM Total Bilirubin: 0.8 mg/dL (Using min of 1 mg/dL) at 04/26/2022  4:12 AM Serum Albumin: 2.2 g/dL at 04/26/2022  4:12 AM INR(ratio): 1.6  at 04/24/2022 10:12 AM Age at listing (hypothetical): 38 years Sex: Male at 04/26/2022  4:12 AM   *No plans for EGD at this time.  Conservative management/supportive care. *Ok to only continue with pantoprazole 40 mg IV BID. *Monitor Hgb/trend labs. *Ok to restart tube feeds. *Continue lactulose and xifaxan.     LOS: 10 days   Laban Emperor. Zehr  06/28/2022, 9:55 AM   Attending physician's note   I have taken a history, reviewed the chart and examined the patient. I performed a substantive portion of this encounter, including complete performance of at least one of the key components, in conjunction  with the APP. I agree with the APP's note, impression and recommendations.    Dark brown liquid stool in flexiseal bag.  Hgb response to pRBC transfusion is more robust than expected. ?Hgb 5.6 was possibly erroneous  Known portal hypertensive gastropathy and chronic GI blood loss Will defer EGD if has no evidence of active GI hemorrhage , he had recent endoscopic evaluation within 6 months  Decompensated cirrhosis with ascites and SBP MELD 3.0: 27 at 04/26/2022  4:12 AM MELD-Na: 26 at 04/26/2022  4:12 AM  Monitor Hgb and transfuse if below 7 Continue IV ceftriaxone and can transition to oral SBP prophylaxis after he is extubated  Continue Lactulose and Xifaxan  Plan to wean off vent and extubate later today per Dr.McQuaid  GI will sign off, available if have any questions  The patient was provided an opportunity to ask questions and all were answered. The patient agreed with the plan and demonstrated an understanding of the instructions.   Damaris Hippo , MD [phone]

## 2022-06-25 NOTE — Evaluation (Signed)
Clinical/Bedside Swallow Evaluation Patient Details  Name: Jeffrey [name] MRN: [id number] Date of Birth: [birth year]  Today's Date: 07/01/2022 Time: SLP Start Time (ACUTE ONLY): 6606 SLP Stop Time (ACUTE ONLY): 3016 SLP Time Calculation (min) (ACUTE ONLY): 13 min  Past Medical History:  Past Medical History:  Diagnosis Date   A-fib [facility])    when initially stating dialysis january 2021 at [facility] per spouse; never heard anything else about it   Alcoholic cirrhosis ([place])    patient reports completing hep a and b vaccines in 2006   Anemia    has had 3 units of prbcs 2013   Anxiety    Arthritis    Asymptomatic gallstones    Ultrasound in 2006   B12 deficiency 11/21/2020   C. difficile colitis    Cholelithiasis    Chronic kidney disease    Dialysis M/W/F/Sa   Depression    Diabetes ([place])    Duodenal ulcer with hemorrhage    per patient in 2006 or 2007, records have been requested   Dyspnea    low oxygen level -  has O2 2 L all day   Esophageal varices ([place])    see [facility]   GERD (gastroesophageal reflux disease)    Heart burn    History of alcohol abuse    quit 05/2013   HOH (hard of hearing)    Hypertension    Liver cirrhosis ([place])    ETOH   SBP (spontaneous bacterial peritonitis) ([place])    2020, November 2021.    Splenomegaly    Ultrasound in 2006   Past Surgical History:  Past Surgical History:  Procedure Laterality Date   AGILE CAPSULE N/A 09/20/2019   Procedure: AGILE CAPSULE;  Surgeon: Daneil Dolin, MD;  [facility];  Service: Endoscopy;  Laterality: N/A;   APPENDECTOMY     AV FISTULA PLACEMENT Left 08/17/2019   Procedure: ARTERIOVENOUS (AV) FISTULA CREATION VERSES ARTERIOVENOUS GRAFT;  Surgeon: [name], MD;  Location: [facility] OR;  Service: Vascular;  Laterality: Left;   BASCILIC VEIN TRANSPOSITION Left 09/28/2019   Procedure: LEFT SECOND STAGE [place];  Surgeon: [name], MD;  Location: [place];  Service: Vascular;   Laterality: Left;   BIOPSY  02/03/2018   Procedure: BIOPSY;  Surgeon: Daneil Dolin, MD;  [facility];  Service: Endoscopy;;  bx of gastric polyps   BIOPSY  09/19/2019   Procedure: BIOPSY;  Surgeon: Danie Binder, MD;  [facility];  Service: Endoscopy;;   BIOPSY  01/16/2021   Procedure: BIOPSY;  Surgeon: Daneil Dolin, MD;  [facility];  Service: Endoscopy;;   CATARACT EXTRACTION Right    COLONOSCOPY  03/10/2005   Rectal polyp as described above, removed with snare. Left sided  diverticula. The remainder of the colonic mucosa appeared normal. Inflammed polyp on path.   COLONOSCOPY  04/1999   Dr. Thea Silversmith polyps removed,  Path showed hyperplastic   COLONOSCOPY  10/2015   Dr. Britta Mccreedy: diverticulosis, single sessile tubular adenoma 3-38m in size removed from descending colon.    COLONOSCOPY WITH ESOPHAGOGASTRODUODENOSCOPY (EGD)  03/31/2012   RWFU:[id number]. Colonic diverticulosis. tubular adenoma colon   COLONOSCOPY WITH ESOPHAGOGASTRODUODENOSCOPY (EGD)  06/2019   FORSYTH: small esophageal varices without high risk stigmata, single large AVM on lesser curvature in gastric body s/p APC ablation, gastric antral and duodenal bulb polyposis. No significant source to explain transfusion dependent anemia. Colonoscopy with portal colopathy and diffuse edema, changes  of Cdiff colitis on colonoscopy   COLONOSCOPY WITH PROPOFOL N/A 01/19/2021   Surgeon: [name], MD; 15 mm polyp in descending colon removed piecemeal, 6 mm polyp in ascending colon and 12 mm polyp in sigmoid colon not removed due to high bleeding risk, benign polypoid lesion at splenic flexure, diverticulosis in sigmoid and descending colon, rectal varices.   COLONOSCOPY WITH PROPOFOL N/A 01/16/2021   Surgeon: Daneil Dolin, MD;  Portal colopathy, large rectal varices, polypoid mass at splenic flexure biopsy, two 1 cm left colon polyps not removed, left-sided diverticulosis.   Reported patient had bleeding from splenic flexure lesion.  Pathology with acutely inflamed colonic mucosa with localized ulceration and prominent reactive changes.   ESOPHAGEAL BANDING N/A 02/03/2018   Procedure: ESOPHAGEAL BANDING;  Surgeon: Daneil Dolin, MD;  [facility];  Service: Endoscopy;  Laterality: N/A;   ESOPHAGEAL BANDING  01/16/2021   Procedure: ESOPHAGEAL BANDING;  Surgeon: Daneil Dolin, MD;  [facility];  Service: Endoscopy;;   ESOPHAGOGASTRODUODENOSCOPY  01/15/2005   Three columns grade 1 to 2 esophageal varices, otherwise normal esophageal mucosa.  Esophagus was not manipulated otherwise./Nodularity of the antrum with overlying erosions, nonspecific finding. Path showed rare H.pylori   ESOPHAGOGASTRODUODENOSCOPY  09/2008   Dr. Gaylene Brooks columns of grade 2-3 esoph varices, only one column was prominent. Portal gastropathy, multiple gastrc polyps at antrum, two were 2cm with black eschar, bulbar polyps, bulbar erosions   ESOPHAGOGASTRODUODENOSCOPY  11/2004   Dr. Brantley Stage 3 esoph varices   ESOPHAGOGASTRODUODENOSCOPY  03/31/2012   RMR: 4 columns(3-GR 2, 1-GR1) non-bleeding esophageal varices, portal gastropathy, small HH, early GAVE, multiple gastric polyps    ESOPHAGOGASTRODUODENOSCOPY (EGD) WITH PROPOFOL N/A 02/03/2018   Dr. Gala Romney: Esophageal varices, 3 columns grade 1-2.  Portal hypertensive gastropathy.  Multiple gastric polyps, biopsy consistent with hyperplastic.   ESOPHAGOGASTRODUODENOSCOPY (EGD) WITH PROPOFOL N/A 09/19/2019   Fields: grade I and II esophageal varcies, mild portal hypertensive gastropathy, moderate gastritis but no H. pylori, single hyperplastic gastric polyp removed, obvious source for melena/transfusion dependent anemia not identified, may be due to friable gastric and duodenal mucosa in the setting of portal hypertension   ESOPHAGOGASTRODUODENOSCOPY (EGD) WITH PROPOFOL N/A 01/16/2021   Surgeon: Daneil Dolin, MD;  Portal hypertensive gastropathy, grade 2 esophageal varices with bleeding stigmata s/p banding x4, slight nodularity and congestion involving bulb and second portion of duodenum, nonspecific.  Recommended repeat EGD in 4 weeks.   ESOPHAGOGASTRODUODENOSCOPY (EGD) WITH PROPOFOL N/A 05/22/2021   stigmata of prior esophageal banding and no significant varices seen. Portal gastropathy. Polypoid gastric and duodenal mucosa with overlying erosions, marked mucosal friability.   ESOPHAGOGASTRODUODENOSCOPY (EGD) WITH PROPOFOL N/A 02/06/2022   Procedure: ESOPHAGOGASTRODUODENOSCOPY (EGD) WITH PROPOFOL;  Surgeon: Harvel Quale, MD;  [facility];  Service: Gastroenterology;  Laterality: N/A;   GASTRIC VARICES BANDING  03/31/2012   Procedure: GASTRIC VARICES BANDING;  Surgeon: Daneil Dolin, MD;  [facility];  Service: Endoscopy;  Laterality: N/A;   GIVENS CAPSULE STUDY N/A 09/21/2019   Poor study with a lot of debris obstructing much of the view of the first approximate 3 hours out of 6 hours.  No obvious source of bleeding identified.  Sequela of bleeding and old blood seen in the form of"whisps" of blood, blood flecks mostly toward the end of the study.   HEMOSTASIS CLIP PLACEMENT  01/19/2021   Procedure: HEMOSTASIS CLIP PLACEMENT;  Surgeon: [name], MD;  Location: [facility] ENDOSCOPY;  Service: Gastroenterology;;  HEMOSTASIS CLIP PLACEMENT  02/06/2022   Procedure: HEMOSTASIS CLIP PLACEMENT;  Surgeon: Harvel Quale, MD;  [facility];  Service: Gastroenterology;;  gastric body   HOT HEMOSTASIS  02/06/2022   Procedure: HOT HEMOSTASIS (ARGON PLASMA COAGULATION/BICAP);  Surgeon: Montez Morita, Quillian Quince, MD;  [facility];  Service: Gastroenterology;;   IR PARACENTESIS  06/18/2022   IR REMOVAL TUN CV CATH W/O FL  02/15/2020   POLYPECTOMY  09/19/2019   Procedure: POLYPECTOMY;  Surgeon: Danie Binder, MD;  [facility];  Service:  Endoscopy;;   POLYPECTOMY  01/19/2021   Procedure: POLYPECTOMY;  Surgeon: [name], MD;  Location: [place];  Service: Gastroenterology;;   UMBILICAL HERNIA REPAIR  2017   exploratory laparotomy, abdominal washout   HPI:  77 yo male admitted 06/07/2022 due to SOB/acute respiratory distress found to be positive for flu.1/22 respiratory distress and hypotensions during HD requiring intubation. ETT 1/22-25. PMH: DM, HTN, GIB, chronic resp failure on 2 L, Afib, ESRD on HD MWF, cirrhosis requiring periodic paracentesis.    Assessment / Plan / Recommendation  Clinical Impression  Pt participated in clinical swallow assessment after failing the [facility] swallow screen. Oral mechanism exam was normal; pt is edentulous.  He presented with consistent wet, delayed cough after consuming trials of thin and nectar-thick liquids. There was frequent belching and pt c/o discomfort. Due to concerns for impaired airway protection, recommend continuing NPO for now.  SLP will f/u next date to determine readiness for POs vs needing instrumental swallow study.  D/W RN and Dr. Alfonse Spruce. SLP Visit Diagnosis: Dysphagia, unspecified (R13.10)    Aspiration Risk  Moderate aspiration risk    Diet Recommendation   NPO       Other  Recommendations Oral Care Recommendations: Oral care QID    Recommendations for follow up therapy are one component of a multi-disciplinary discharge planning process, led by the attending physician.  Recommendations may be updated based on patient status, additional functional criteria and insurance authorization.  Follow up Recommendations Other (comment) (tba)              Frequency and Duration min 2x/week  2 weeks       Prognosis Prognosis for Safe Diet Advancement: Good      Swallow Study   General HPI: 77 yo male admitted 06/14/2022 due to SOB/acute respiratory distress found to be positive for flu.1/22 respiratory distress and hypotensions during HD requiring  intubation. ETT 1/22-25. PMH: DM, HTN, GIB, chronic resp failure on 2 L, Afib, ESRD on HD MWF, cirrhosis requiring periodic paracentesis. Type of Study: Bedside Swallow Evaluation Previous Swallow Assessment: no Diet Prior to this Study: NPO Temperature Spikes Noted: No Respiratory Status: Nasal cannula History of Recent Intubation: Yes Length of Intubations (days): 3 days Date extubated: 06/29/2022 Behavior/Cognition: Alert;Confused Oral Cavity Assessment: Within Functional Limits Oral Care Completed by SLP: Recent completion by staff Oral Cavity - Dentition: Edentulous Vision: Functional for self-feeding Self-Feeding Abilities: Needs assist Patient Positioning: Upright in bed Baseline Vocal Quality: Hoarse Volitional Cough: Strong Volitional Swallow: Able to elicit    Oral/Motor/Sensory Function Overall Oral Motor/Sensory Function: Within functional limits   Ice Chips Ice chips: Within functional limits   Thin Liquid Thin Liquid: Impaired Presentation: Cup;Spoon;Straw Pharyngeal  Phase Impairments: Multiple swallows;Wet Vocal Quality;Cough - Delayed    Nectar Thick Nectar Thick Liquid: Impaired Presentation: Cup Pharyngeal Phase Impairments: Multiple swallows;Wet Vocal Quality;Cough - Delayed   Honey Thick Honey Thick Liquid: Not tested  Puree Puree: Not tested   Solid     Solid: Not tested     Estill Bamberg L. Tivis Ringer, MA CCC/SLP Clinical Specialist - Acute Care SLP Acute Rehabilitation Services Office number [phone]  Juan Quam Laurice 06/07/2022,4:15 PM

## 2022-06-25 NOTE — Progress Notes (Signed)
[place] Progress Note Patient Name: Jeffrey [name] DOB: [birth year] MRN: [id number]   Date of Service  06/03/2022  HPI/Events of Note  Patient needs restraints renewed to prevent self-extubation,  eICU Interventions  Restraints renewed.        Kerry Kass [name] 06/07/2022, 12:27 AM

## 2022-06-25 NOTE — Progress Notes (Signed)
Inpatient Rehab Admissions Coordinator:   Per PT recs pt was screened for CIR candidacy by Shann Medal, PT, DPT.  Note pt still on the vent.  Will rescreen once extubated and pt has a chance to work with therapy.    Shann Medal, PT, DPT Admissions Coordinator [phone] 06/29/2022  10:44 AM

## 2022-06-25 NOTE — Procedures (Signed)
Extubation Procedure Note  Patient Details:   Name: Jeffrey [name] DOB: 10-30-1945 MRN: [id number]   Airway Documentation:    Vent end date: 06/04/2022 Vent end time: 1202   Evaluation  O2 sats: stable throughout Complications: No apparent complications Patient did tolerate procedure well. Bilateral Breath Sounds: Clear, Diminished   Yes, pt could speak post extubation.  Pt extubated to 4 l/m [place] per physician's order.  Earney Navy 06/27/2022, 12:03 PM

## 2022-06-25 NOTE — Consult Note (Addendum)
NAME:  Jeffrey [name], MRN:  [id number], DOB:  [birth year], LOS: 45 ADMISSION DATE:  06/11/2022, CONSULTATION DATE:  06/22/22 REFERRING MD:  Sabino Gasser TRH, CHIEF COMPLAINT:  AMS   History of Present Illness:  77 yo M PMH ESRD on MWF HD, alcoholic cirrhosis, DM2, Chronic hypoxic respiratory failure on 2L, Afib, cirrhosis was admitted 1/15 for acute on chronic hypoxic resp failure, Flu PNA.   On  1/22 he was more lethargic. While in iHD he was started on BiPAP but remained lethargic.  ABG with resp acidosis   PCCM consulted for intubation in this setting   Pertinent  Medical History   Past Medical History:  Diagnosis Date   A-fib [facility])    when initially stating dialysis january 2021 at [facility] per spouse; never heard anything else about it   Alcoholic cirrhosis [facility])    patient reports completing hep a and b vaccines in 2006   Anemia    has had 3 units of prbcs 2013   Anxiety    Arthritis    Asymptomatic gallstones    Ultrasound in 2006   B12 deficiency 11/21/2020   C. difficile colitis    Cholelithiasis    Chronic kidney disease    Dialysis M/W/F/Sa   Depression    Diabetes ([place])    Duodenal ulcer with hemorrhage    per patient in 2006 or 2007, records have been requested   Dyspnea    low oxygen level -  has O2 2 L all day   Esophageal varices ([place])    see PSH   GERD (gastroesophageal reflux disease)    Heart burn    History of alcohol abuse    quit 05/2013   HOH (hard of hearing)    Hypertension    Liver cirrhosis ([place])    ETOH   SBP (spontaneous bacterial peritonitis) ([place])    2020, November 2021.    Splenomegaly    Ultrasound in 2006    Significant Hospital Events: Including procedures, antibiotic start and stop dates in addition to other pertinent events   1/15 admit to [facility] from [facility] for iHD need 1/15 nephro consult, HD  1/16 Flu A pos, tamiflu  1/18 para, no SBP  1/19 steroids added for incr O2 need  1/22 PCCM consulted for AMS hypercarbic resp  failure, SOB. Transfer to ICU for intubation   Interim History / Subjective:  Patient is awake and alert today.  He appears in no acute distress.  He denies any discomfort.  He is able to follow simple commands. Objective   Blood pressure (!) 172/58, pulse 72, temperature 98 F (36.7 C), temperature source Oral, resp. rate (!) 21, height '5\' 7"'$  (1.702 m), weight 65.7 kg, SpO2 100 %.    Vent Mode: PRVC FiO2 (%):  [40 %] 40 % Set Rate:  [18 bmp] 18 bmp Vt Set:  [440 mL] 440 mL PEEP:  [5 cmH20] 5 cmH20 Plateau Pressure:  [14 cmH20-28 cmH20] 14 cmH20   Intake/Output Summary (Last 24 hours) at 06/17/2022 0733 Last data filed at 06/26/2022 0600 Gross per 24 hour  Intake 3235.21 ml  Output 0 ml  Net 3235.21 ml    Filed Weights   06/23/22 1655 06/23/22 2012 06/24/22 0500  Weight: 67 kg 65 kg 65.7 kg    Examination: Physical exam: General: Chronically ill male, cachetic HEENT:  ETT tube in place Neuro: Awake and alert.  Able to follow commands. Chest: no wheezes  Heart: RRR, no murmurs or gallops,  trace edema of bilateral lower extremities Abdomen: Soft, nondistended, bowel sounds present Skin: No rash   Resolved Hospital Problem list     Assessment & Plan:  Acute on chronic deficiency anemia History of GI bleed Patient had 2 EGD in September 2023 and had APC for bleeding gastropathy.  He only has grade 1 varices.  He had a colonoscopy in 12/2018 through which had a possible bleeding polypoid lesion in the splenic flexure. Hgb improved to 8.5 s/p 2 u RBC.  Discussed with GI this morning.  They think that an EGD is not indicated and the low hemoglobin could be a lab error.  Patient has portal hypertension gastropathy and at high risk for bleeding and decline. -Holding DVT prophylaxis today.  Can resume tomorrow if hemoglobin stable. -Appreciate GI recommendation -Continue IV PPI and octreotide.  Defer to GI if wants to continue. -Check ferritin  Acute on chronic respiratory  failure with hypoxia and hypercapnia Influenza A PNA Patient uses 2 L oxygen at baseline -Repeat CXR -No indication for antibiotic or steroid -Attempt weaning  -VAP bundle  Acute metabolic encephalopathy Multifactorial from uremia, hypercarbia and hepatic encephalopathy.  Mental status improved significantly after 2 HD sections. -Continue lactulose and rifaximin.  Keep K above 4 -Wean Precedex if able  Shock of multifactorial causes Secondary to IV sedation in the setting of cirrhosis and ESRD.  Now also had GI bleed.  He is off of all pressors -Taper midodrine to 5 mg 3 times daily  Thrombocytopenia Platelet is downtrending slowly in the last few days.  His baseline platelets around 100s.  Patient is been here for 9 days and has been receiving IV heparin.  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  He has other explanation for thrombocytopenia including cirrhosis, ESRD and acute illness. -Obtain HIT antibody lab -Obtain peripheral blood smear and differential -Will not start argatroban in the setting of GI bleed  A-fib with RVR Patient is back to his normal sinus rhythm.  He was transitioned to p.o. amnio yesterday. -Amiodarone 100 mg daily -He is not on anticoagulation at home.  Not a candidate for DOAC given his ESRD.  Warfarin may be his only choice.  His HAS-BLED score is 5 indicates high risk of bleeding. This will be a risk vs benefit conversation with family.   ESRD on HD MWF -Nephrology is following  Decompensated alcoholic cirrhosis with ascites with esophageal varices and encephalopathy Patient had paracentesis done on 1/18, which was negative for SBP Continue lactulose, rifaximin, inderol    Hypervolemic hyponatremia Monitor electrolytes  DM2 Continue scale insulin with CBG goal 140-180 -Starting tube feed today  Best Practice (right click and "Reselect all SmartList Selections" daily)   Diet/type: NPO tube feed DVT prophylaxis: NA GI prophylaxis: IV PPI Lines: RIJ   Foley:  NA Code Status:  DNR Last date of multidisciplinary goals of care discussion [1/23}  Labs   CBC: Recent Labs  Lab 06/19/22 0430 06/20/22 0344 06/21/22 0403 06/22/22 0333 06/22/22 1808 06/23/22 0818 06/23/22 0926 06/24/22 0443 06/24/22 1640 06/26/2022 0440  WBC 8.6 5.5 7.8 5.4 14.5* 18.8*  --  8.7 6.8 7.4  NEUTROABS 7.9* 5.0 7.5 5.1  --  18.2*  --   --   --   --   HGB 8.5* 8.2* 8.2* 7.4* 8.1* 7.0* 7.5* 5.6* 8.0* 8.5*  HCT 26.9* 26.4* 25.4* 23.8* 25.6* 21.9* 22.0* 17.0* 23.7* 25.8*  MCV 99.3 101.5* 100.0 100.8* 98.5 96.9  --  98.3 94.8 94.9  PLT 109*  105* 102* 84* 124* 130*  --  80* 60* 57*     Basic Metabolic Panel: Recent Labs  Lab 06/22/22 0333 06/22/22 1808 06/23/22 0818 06/23/22 0926 06/23/22 2027 06/24/22 0443 06/24/22 1640 07/01/2022 0440  NA 131* 132* 128* 130*  --  131*  --  131*  K 4.3 3.9 4.3 4.5  --  3.5  --  3.5  CL 92* 92* 90*  --   --  94*  --  95*  CO2 '25 26 24  '$ --   --  26  --  26  GLUCOSE 214* 193* 209*  --   --  245*  --  187*  BUN 103* 99* 114*  --   --  55*  --  28*  CREATININE 4.13* 4.06* 4.53*  --   --  2.74*  --  1.79*  CALCIUM 8.9 9.0 9.0  --   --  8.5*  --  7.8*  MG 2.1  --  2.1  --  1.7 1.9 1.5* 2.3  PHOS 4.2 4.4 2.5  --  1.7* 1.9*  1.8* 1.1* 2.0*    GFR: Estimated Creatinine Clearance: 33.1 mL/min (A) (by C-G formula based on SCr of 1.79 mg/dL (H)). Recent Labs  Lab 06/23/22 0818 06/24/22 0443 06/24/22 1640 06/18/2022 0440  WBC 18.8* 8.7 6.8 7.4     Liver Function Tests: Recent Labs  Lab 06/22/22 0333 06/22/22 1808 06/23/22 0818 06/24/22 0443 06/06/2022 0440  ALBUMIN 2.0* 2.2* 2.0* 2.5* 2.2*    No results for input(s): "LIPASE", "AMYLASE" in the last 168 hours. Recent Labs  Lab 06/20/22 0344 06/23/22 0818  AMMONIA 82* 91*     ABG    Component Value Date/Time   PHART 7.28 (L) 06/22/2022 1810   PCO2ART 61 (H) 06/22/2022 1810   PO2ART 282 (H) 06/22/2022 1810   HCO3 27.1 06/23/2022 0926   TCO2 29  06/23/2022 0926   ACIDBASEDEF 5.6 (H) 06/22/2022 1302   O2SAT 31 06/23/2022 0926     Coagulation Profile: Recent Labs  Lab 06/24/22 0759  INR 1.7*    Cardiac Enzymes: No results for input(s): "CKTOTAL", "CKMB", "CKMBINDEX", "TROPONINI" in the last 168 hours.  HbA1C: Hgb A1c MFr Bld  Date/Time Value Ref Range Status  04/23/2022 02:40 AM 6.1 (H) 4.8 - 5.6 % Final    Comment:    (NOTE)         Prediabetes: 5.7 - 6.4         Diabetes: >6.4         Glycemic control for adults with diabetes: <7.0   07/29/2021 05:22 AM 5.5 4.8 - 5.6 % Final    Comment:    (NOTE) Pre diabetes:          5.7%-6.4%  Diabetes:              >6.4%  Glycemic control for   <7.0% adults with diabetes     CBG: Recent Labs  Lab 06/24/22 1909 06/24/22 2314 06/14/2022 0306 06/02/2022 0424 06/16/2022 0718  GLUCAP 156* 277* 226* 194* 147*     Review of Systems:   Unable to obtain due to encephalopathy   Past Medical History:  He,  has a past medical history of A-fib ([place]), Alcoholic cirrhosis ([place]), Anemia, Anxiety, Arthritis, Asymptomatic gallstones, B12 deficiency (11/21/2020), C. difficile colitis, Cholelithiasis, Chronic kidney disease, Depression, Diabetes ([place]), Duodenal ulcer with hemorrhage, Dyspnea, Esophageal varices ([place]), GERD (gastroesophageal reflux disease), Heart burn, History of alcohol abuse, HOH (hard of hearing), Hypertension, Liver  cirrhosis ([place]), SBP (spontaneous bacterial peritonitis) ([place]), and Splenomegaly.   Surgical History:   Past Surgical History:  Procedure Laterality Date   AGILE CAPSULE N/A 09/20/2019   Procedure: AGILE CAPSULE;  Surgeon: Daneil Dolin, MD;  [facility];  Service: Endoscopy;  Laterality: N/A;   APPENDECTOMY     AV FISTULA PLACEMENT Left 08/17/2019   Procedure: ARTERIOVENOUS (AV) FISTULA CREATION VERSES ARTERIOVENOUS GRAFT;  Surgeon: [name], MD;  Location: [facility];  Service: Vascular;  Laterality: Left;   BASCILIC VEIN  TRANSPOSITION Left 09/28/2019   Procedure: LEFT SECOND STAGE [place];  Surgeon: [name], MD;  Location: [place];  Service: Vascular;  Laterality: Left;   BIOPSY  02/03/2018   Procedure: BIOPSY;  Surgeon: Daneil Dolin, MD;  [facility];  Service: Endoscopy;;  bx of gastric polyps   BIOPSY  09/19/2019   Procedure: BIOPSY;  Surgeon: Danie Binder, MD;  [facility];  Service: Endoscopy;;   BIOPSY  01/16/2021   Procedure: BIOPSY;  Surgeon: Daneil Dolin, MD;  [facility];  Service: Endoscopy;;   CATARACT EXTRACTION Right    COLONOSCOPY  03/10/2005   Rectal polyp as described above, removed with snare. Left sided  diverticula. The remainder of the colonic mucosa appeared normal. Inflammed polyp on path.   COLONOSCOPY  04/1999   Dr. Thea Silversmith polyps removed,  Path showed hyperplastic   COLONOSCOPY  10/2015   Dr. Britta Mccreedy: diverticulosis, single sessile tubular adenoma 3-75m in size removed from descending colon.    COLONOSCOPY WITH ESOPHAGOGASTRODUODENOSCOPY (EGD)  03/31/2012   RZOX:[id number]. Colonic diverticulosis. tubular adenoma colon   COLONOSCOPY WITH ESOPHAGOGASTRODUODENOSCOPY (EGD)  06/2019   FORSYTH: small esophageal varices without high risk stigmata, single large AVM on lesser curvature in gastric body s/p APC ablation, gastric antral and duodenal bulb polyposis. No significant source to explain transfusion dependent anemia. Colonoscopy with portal colopathy and diffuse edema, changes of Cdiff colitis on colonoscopy   COLONOSCOPY WITH PROPOFOL N/A 01/19/2021   Surgeon: CDaryel November MD; 15 mm polyp in descending colon removed piecemeal, 6 mm polyp in ascending colon and 12 mm polyp in sigmoid colon not removed due to high bleeding risk, benign polypoid lesion at splenic flexure, diverticulosis in sigmoid and descending colon, rectal varices.   COLONOSCOPY WITH PROPOFOL N/A 01/16/2021   Surgeon: RDaneil Dolin  MD;  Portal colopathy, large rectal varices, polypoid mass at splenic flexure biopsy, two 1 cm left colon polyps not removed, left-sided diverticulosis.  Reported patient had bleeding from splenic flexure lesion.  Pathology with acutely inflamed colonic mucosa with localized ulceration and prominent reactive changes.   ESOPHAGEAL BANDING N/A 02/03/2018   Procedure: ESOPHAGEAL BANDING;  Surgeon: RDaneil Dolin MD;  [facility];  Service: Endoscopy;  Laterality: N/A;   ESOPHAGEAL BANDING  01/16/2021   Procedure: ESOPHAGEAL BANDING;  Surgeon: RDaneil Dolin MD;  [facility];  Service: Endoscopy;;   ESOPHAGOGASTRODUODENOSCOPY  01/15/2005   Three columns grade 1 to 2 esophageal varices, otherwise normal esophageal mucosa.  Esophagus was not manipulated otherwise./Nodularity of the antrum with overlying erosions, nonspecific finding. Path showed rare H.pylori   ESOPHAGOGASTRODUODENOSCOPY  09/2008   Dr. RGaylene Brookscolumns of grade 2-3 esoph varices, only one column was prominent. Portal gastropathy, multiple gastrc polyps at antrum, two were 2cm with black eschar, bulbar polyps, bulbar erosions   ESOPHAGOGASTRODUODENOSCOPY  11/2004   Dr. FBrantley Stage3 esoph varices  ESOPHAGOGASTRODUODENOSCOPY  03/31/2012   RMR: 4 columns(3-GR 2, 1-GR1) non-bleeding esophageal varices, portal gastropathy, small HH, early GAVE, multiple gastric polyps    ESOPHAGOGASTRODUODENOSCOPY (EGD) WITH PROPOFOL N/A 02/03/2018   Dr. Gala Romney: Esophageal varices, 3 columns grade 1-2.  Portal hypertensive gastropathy.  Multiple gastric polyps, biopsy consistent with hyperplastic.   ESOPHAGOGASTRODUODENOSCOPY (EGD) WITH PROPOFOL N/A 09/19/2019   Fields: grade I and II esophageal varcies, mild portal hypertensive gastropathy, moderate gastritis but no H. pylori, single hyperplastic gastric polyp removed, obvious source for melena/transfusion dependent anemia not identified, may be due to friable gastric and  duodenal mucosa in the setting of portal hypertension   ESOPHAGOGASTRODUODENOSCOPY (EGD) WITH PROPOFOL N/A 01/16/2021   Surgeon: Daneil Dolin, MD; Portal hypertensive gastropathy, grade 2 esophageal varices with bleeding stigmata s/p banding x4, slight nodularity and congestion involving bulb and second portion of duodenum, nonspecific.  Recommended repeat EGD in 4 weeks.   ESOPHAGOGASTRODUODENOSCOPY (EGD) WITH PROPOFOL N/A 05/22/2021   stigmata of prior esophageal banding and no significant varices seen. Portal gastropathy. Polypoid gastric and duodenal mucosa with overlying erosions, marked mucosal friability.   ESOPHAGOGASTRODUODENOSCOPY (EGD) WITH PROPOFOL N/A 02/06/2022   Procedure: ESOPHAGOGASTRODUODENOSCOPY (EGD) WITH PROPOFOL;  Surgeon: Harvel Quale, MD;  [facility];  Service: Gastroenterology;  Laterality: N/A;   GASTRIC VARICES BANDING  03/31/2012   Procedure: GASTRIC VARICES BANDING;  Surgeon: Daneil Dolin, MD;  [facility];  Service: Endoscopy;  Laterality: N/A;   GIVENS CAPSULE STUDY N/A 09/21/2019   Poor study with a lot of debris obstructing much of the view of the first approximate 3 hours out of 6 hours.  No obvious source of bleeding identified.  Sequela of bleeding and old blood seen in the form of"whisps" of blood, blood flecks mostly toward the end of the study.   HEMOSTASIS CLIP PLACEMENT  01/19/2021   Procedure: HEMOSTASIS CLIP PLACEMENT;  Surgeon: [name], MD;  Location: [facility] ENDOSCOPY;  Service: Gastroenterology;;   HEMOSTASIS CLIP PLACEMENT  02/06/2022   Procedure: HEMOSTASIS CLIP PLACEMENT;  Surgeon: Montez Morita, Quillian Quince, MD;  [facility];  Service: Gastroenterology;;  gastric body   HOT HEMOSTASIS  02/06/2022   Procedure: HOT HEMOSTASIS (ARGON PLASMA COAGULATION/BICAP);  Surgeon: Montez Morita, Quillian Quince, MD;  [facility];  Service: Gastroenterology;;   IR PARACENTESIS  06/18/2022   IR REMOVAL  TUN CV CATH W/O FL  02/15/2020   POLYPECTOMY  09/19/2019   Procedure: POLYPECTOMY;  Surgeon: Danie Binder, MD;  [facility];  Service: Endoscopy;;   POLYPECTOMY  01/19/2021   Procedure: POLYPECTOMY;  Surgeon: [name], MD;  Location: [facility] ENDOSCOPY;  Service: Gastroenterology;;   UMBILICAL HERNIA REPAIR  2017   exploratory laparotomy, abdominal washout     Social History:   reports that he quit smoking about 36 years ago. His smoking use included cigarettes. He has a 50.00 pack-year smoking history. He has never used smokeless tobacco. He reports that he does not drink alcohol and does not use drugs.   Family History:  His family history includes Breast cancer in his sister; Colon cancer (age of onset: 58) in his father; Diabetes in his sister; Healthy in his daughter and son; Stroke in his mother. There is no history of Lung cancer or Ovarian cancer.   Allergies Allergies  Allergen Reactions   Tape Other (See Comments)    Very thin skin, tears easily     Home Medications  Prior to Admission medications  Medication Sig Start Date End Date Taking? Authorizing Provider  acetaminophen (TYLENOL) 325 MG tablet Take 650 mg by mouth every 6 (six) hours as needed for mild pain, moderate pain, fever or headache.   Yes [provider]  allopurinol (ZYLOPRIM) 300 MG tablet Take 300 mg by mouth in the morning. 12/11/21  Yes [provider]  ALPRAZolam Duanne Moron) 0.5 MG tablet Take 1 tablet (0.5 mg total) by mouth See admin instructions. Take one tablet (0.5 mg) by mouth on Monday, Wednesday, Friday before dialysis, may also take one tablet (0.5 mg) daily as needed for anxiety. 02/07/22  Yes Tat, Shanon Brow, MD  ASCORBIC ACID PO Take 1 tablet by mouth in the morning. Vitamin C, unknown strength.   Yes [provider]  Calcium Carbonate Antacid (TUMS PO) Take 2 tablets by mouth with breakfast, with lunch, and with evening meal.   Yes [provider]   cetirizine (ZYRTEC) 10 MG tablet Take 10 mg by mouth in the morning. 06/24/21  Yes [provider]  Cholecalciferol (VITAMIN D-3 PO) Take 1 capsule by mouth in the morning.   Yes [provider]  diltiazem (DILACOR XR) 120 MG 24 hr capsule Take 120 mg by mouth in the morning. 05/19/22  Yes [provider]  famotidine (PEPCID) 20 MG tablet Take 1 tablet (20 mg total) by mouth 2 (two) times daily. 07/30/21 07/30/22 Yes Barton Dubois, MD  furosemide (LASIX) 40 MG tablet Take 80 mg by mouth in the morning. 12/29/19  Yes [provider]  HUMALOG KWIKPEN 100 UNIT/ML KwikPen Inject 10-50 Units into the skin See admin instructions. Inject 10-50 units three times daily with meals per sliding scale. 04/23/21  Yes [provider]  HYDROcodone-acetaminophen (NORCO/VICODIN) 5-325 MG tablet Take 1 tablet by mouth daily as needed for severe pain.   Yes [provider]  hydrOXYzine (ATARAX/VISTARIL) 25 MG tablet Take 25 mg by mouth 3 (three) times daily. 11/23/20  Yes [provider]  iron polysaccharides (NIFEREX) 150 MG capsule Take 150 mg by mouth at bedtime.   Yes [provider]  Lactobacillus (ACIDOPHILUS PO) Take 1 capsule by mouth in the morning.   Yes [provider]  lactulose (CHRONULAC) 10 GM/15ML solution Take 15 mLs (10 g total) by mouth 2 (two) times daily. Patient taking differently: Take 10 g by mouth See admin instructions. 10g twice daily on Sunday, Tuesday, Thursday, Saturday 10g in the evening on Monday, Wednesday, Friday after dialysis. 02/07/22  Yes Tat, Shanon Brow, MD  metoprolol succinate (TOPROL-XL) 25 MG 24 hr tablet Take 1 tablet (25 mg total) by mouth daily. Patient taking differently: Take 25 mg by mouth See admin instructions. 25 mg twice daily on Sunday, Tuesday, Thursday, Saturday. 25 mg at bedtime on Monday, Wednesday, Friday (does not take before dialysis) 02/22/22  Yes Tat, Shanon Brow, MD  mirtazapine (REMERON) 30 MG  tablet Take 1 tablet (30 mg total) by mouth at bedtime. 02/10/20  Yes Roxan Hockey, MD  Multiple Vitamin (MULTIVITAMIN WITH MINERALS) TABS tablet Take 1 tablet by mouth in the morning.    Yes [provider]  nitroGLYCERIN (NITROGLYN) 2 % ointment Apply 2 inches topically once as needed for chest pain.   Yes [provider]  omega-3 acid ethyl esters (LOVAZA) 1 g capsule Take 2 g by mouth 2 (two) times daily.  04/07/19  Yes [provider]  ondansetron (ZOFRAN) 4 MG tablet Take 4 mg by mouth every 8 (eight) hours as needed for nausea or vomiting.  Yes [provider]  pantoprazole (PROTONIX) 40 MG tablet Take 1 tablet (40 mg total) by mouth 2 (two) times daily. 02/21/22  Yes Tat, Shanon Brow, MD  pravastatin (PRAVACHOL) 20 MG tablet Take 20 mg by mouth in the morning.    Yes [provider]  rifaximin (XIFAXAN) 550 MG TABS tablet Take 1 tablet (550 mg total) by mouth 2 (two) times daily. 05/02/22  Yes Emokpae, Courage, MD  SSD 1 % cream Apply 2 Applications topically 2 (two) times daily as needed (bed sores). 02/26/22  Yes [provider]  sulfamethoxazole-trimethoprim (BACTRIM DS) 800-160 MG tablet TAKE 1 TABLET BY MOUTH every friday AFTER dialysis Patient taking differently: Take 1 tablet by mouth See admin instructions. 1 tablet every Friday, after dialysis 03/12/22  Yes Annitta Needs, NP  thiamine (VITAMIN B-1) 100 MG tablet Take 100 mg by mouth in the morning. 08/27/20  Yes [provider]  tiZANidine (ZANAFLEX) 2 MG tablet Take 2 mg by mouth at bedtime as needed for muscle spasms. 04/07/22  Yes [provider]  TRESIBA FLEXTOUCH 200 UNIT/ML FlexTouch Pen Inject 10-50 Units into the skin See admin instructions. Inject 10-50 units once daily at bedtime as needed for high blood sugar levels, per sliding scale. 02/17/22  Yes [provider]  triamcinolone cream (KENALOG) 0.1 % Apply 1 application  topically 2 (two) times daily as  needed (dry skin, itching). 12/30/17  Yes [provider]  venlafaxine (EFFEXOR) 75 MG tablet Take 75 mg by mouth 3 (three) times daily with meals.    Yes [provider]  zinc sulfate 220 (50 Zn) MG capsule Take 1 capsule (220 mg total) by mouth daily. Patient taking differently: Take 220 mg by mouth in the morning. 05/03/22  Yes Roxan Hockey, MD     Critical care time:      Gaylan Gerold, DO Internal Medicine Residency My pager: [phone]

## 2022-06-25 NOTE — Progress Notes (Addendum)
Physical Therapy Treatment Patient Details Name: Jeffrey [name] MRN: [id number] DOB: Oct 26, 1945 Today's Date: 06/28/2022   History of Present Illness 77 yo male admitted 06/11/2022 due to SOB/acute respiratory distress found to be positive for flu.1/22 respiratory distress and hypotensions during HD requiring intubation. PMH: DM, HTN, GIB, chronic resp failure on 2 L, Afib, ESRD on HD MWF, cirrhosis requiring periodic paracentesis.    PT Comments    Pt seen in conjunction with OT with pt able to sit EOB, stand x 5 trials and side step toward HOB. Pt on PRVC 40% with peep5, ETT at 24cm and SPO2 97%, HR 82. No drop in BP with transition to sitting despite pt reporting mild dizziness. Pt with significant weakness with assist for standing and balance who will benefit from AIR post extubation to maximize strength and function prior to home. Will continue to follow.     Recommendations for follow up therapy are one component of a multi-disciplinary discharge planning process, led by the attending physician.  Recommendations may be updated based on patient status, additional functional criteria and insurance authorization.  Follow Up Recommendations  Acute inpatient rehab (3hours/day)     Assistance Recommended at Discharge Frequent or constant Supervision/Assistance  Patient can return home with the following A lot of help with walking and/or transfers;A lot of help with bathing/dressing/bathroom;Assistance with cooking/housework;Direct supervision/assist for medications management;Assist for transportation   Equipment Recommendations       Recommendations for Other Services       Precautions / Restrictions Precautions Precautions: Fall Precaution Comments: ETT, vent, flexiseal, cortrak, bil wrist restraints and mittens     Mobility  Bed Mobility Overal bed mobility: Needs Assistance Bed Mobility: Supine to Sit     Supine to sit: HOB elevated, Mod assist     General bed mobility  comments: HOB 30 degrees with mod assist to pivot to EOB and elevate trunk. Min assist +2 to return to supine to clear legs and manage lines    Transfers Overall transfer level: Needs assistance   Transfers: Sit to/from Stand Sit to Stand: Mod assist, +2 physical assistance, +2 safety/equipment           General transfer comment: pt able to stand with mod assist 1st trial with bil knees blocked then performed additional 4 standing trials with +2 assist and bil UE support. PT with maintained flexed posture and assist to side step toward HOB x 3 attempts. Pt sat EOB 10 min    Ambulation/Gait               General Gait Details: not yet ready   Stairs             Wheelchair Mobility    Modified Rankin (Stroke Patients Only)       Balance Overall balance assessment: Needs assistance Sitting-balance support: No upper extremity supported, Feet supported Sitting balance-Leahy Scale: Fair Sitting balance - Comments: static sitting with guarding for safety   Standing balance support: Bilateral upper extremity supported, During functional activity Standing balance-Leahy Scale: Poor Standing balance comment: bil UE support and knees blocked in standing                            Cognition Arousal/Alertness: Awake/alert Behavior During Therapy: Flat affect Overall Cognitive Status: Difficult to assess  General Comments: pt following single step commands, limited ability to write for communication        Exercises      General Comments        Pertinent Vitals/Pain Pain Assessment Pain Assessment: No/denies pain    Home Living                          Prior Function            PT Goals (current goals can now be found in the care plan section) Acute Rehab PT Goals Patient Stated Goal: to get stronger and go home PT Goal Formulation: With patient Time For Goal Achievement:  07/09/22 Potential to Achieve Goals: Fair Progress towards PT goals: Progressing toward goals    Frequency    Min 3X/week      PT Plan Discharge plan needs to be updated    Co-evaluation              AM-PAC PT "6 Clicks" Mobility   Outcome Measure  Help needed turning from your back to your side while in a flat bed without using bedrails?: A Little Help needed moving from lying on your back to sitting on the side of a flat bed without using bedrails?: A Lot Help needed moving to and from a bed to a chair (including a wheelchair)?: Total Help needed standing up from a chair using your arms (e.g., wheelchair or bedside chair)?: Total Help needed to walk in hospital room?: Total Help needed climbing 3-5 steps with a railing? : Total 6 Click Score: 9    End of Session   Activity Tolerance: Patient tolerated treatment well Patient left: in bed;with call bell/phone within reach;with bed alarm set Nurse Communication: Mobility status PT Visit Diagnosis: Other abnormalities of gait and mobility (R26.89);Muscle weakness (generalized) (M62.81);Other symptoms and signs involving the nervous system (R29.898);Unsteadiness on feet (R26.81)     Time: 6734-1937 PT Time Calculation (min) (ACUTE ONLY): 27 min  Charges:  $Therapeutic Activity: 8-22 mins                     Bayard Males, PT Acute Rehabilitation Services Office: [phone]    Lamarr Lulas 06/26/2022, 9:49 AM

## 2022-06-25 NOTE — Progress Notes (Addendum)
[facility] Progress Note   Subjective:  had HD yesterday in ICU w/ 2 L UF. Is on levo gtt at 4-10 ug/min.    Objective Vitals:   06/11/2022 1000 07/01/2022 1100 06/24/2022 1200 06/29/2022 1300  BP: (!) 143/68 (!) 150/60 (!) 153/54 (!) 134/51  Pulse: 82 81 86 81  Resp: (!) 26 (!) 27 (!) 22 18  Temp:      TempSrc:      SpO2: 94% 97% 93% 99%  Weight:      Height:       Physical Exam General: chronically ill appearing HEENT: orally intubated  Heart: RRR  Lungs: BS decreased,  Abdomen: soft, NT Extremities: no LE edema, 1+ UE edema Dialysis Access: LU AVF +b/t   Dialysis Orders: [facility] MWF   4h  80.5kg  350/500  2/2.5 bath  AVF  Hep none  Mircera 200 (due 1/15)      CXR 1/23 - Mild, stable bibasilar airspace disease is seen with small, stable bilateral pleural effusions  Assessment/Plan: Dyspnea/respiratory distress -secondary to volume overload/Influenza A +--initially requiring BiPAP on admit, then improved, then decompensated again and required intubation / ICU on 1/22. Had HD 1/23 and 1/24. Extubated now.  Encephalopathy - metabolic/hepatic encephalopathy.  Lactulose/rfaximin per primary.  ESRD -  HD MWF. HD yesterday off schedule. Plan HD again today in ICU.  Shock/ hypotension - low dose pressors are off now, getting midodrine 10 tid per tube Afib /RVR - on amio, no a/c w/ possible GI bleed Possible GI bleed - sp octreotide, PPI gtt, changed to PPI bid now. No egd planned for now Volume - wt's down 5-6 kg here and overall is 15kg under dry wt. Lower vol further w/ HD tomorrow as tolerated.  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  Metabolic bone disease -  Ca and phos in goal. No VDRA as outpatient  Nutrition - Renal diet with fluid restriction.  Hx of cirrhosis with ascites - per primary team.   Had paracentesis with 2.3L off 1/19.   H/o etoh abuse Flu A + - completed course of Tamiflu  DNR - pt is now DNR status  Kelly Splinter,  MD 06/18/2022, 2:25 PM  Recent Labs  Lab 06/24/22 0443 06/24/22 1640 07/01/2022 0440 06/20/2022 0920  HGB 5.6* 8.0* 8.5* 8.5*  ALBUMIN 2.5*  --  2.2*  --   CALCIUM 8.5*  --  7.8*  --   PHOS 1.9*  1.8* 1.1* 2.0*  --   CREATININE 2.74*  --  1.79*  --   K 3.5  --  3.5  --      Inpatient medications:  [START ON 06/26/2022] allopurinol  100 mg Oral Q M,W,F-2000   [START ON 06/26/2022] amiodarone  100 mg Oral Daily   Chlorhexidine Gluconate Cloth  6 each Topical Q0600   Chlorhexidine Gluconate Cloth  6 each Topical Q0600   feeding supplement (PROSource TF20)  60 mL Per Tube Daily   insulin aspart  0-20 Units Subcutaneous Q4H   insulin aspart  4 Units Subcutaneous Q4H   [START ON 06/26/2022] iron polysaccharides  150 mg Oral Daily   lactulose  30 g Oral TID   midodrine  5 mg Oral TID WC   mirtazapine  30 mg Oral QHS   multivitamin  1 tablet Oral QHS   mouth rinse  15 mL Mouth Rinse Q2H   pantoprazole  40 mg Intravenous Q12H   [START ON 06/26/2022] pravastatin  20 mg Oral q AM   rifaximin  550 mg Oral BID   [START ON 06/26/2022] thiamine  100 mg Oral Daily   venlafaxine  75 mg Oral TID WC    sodium chloride Stopped (06/23/22 0834)   anticoagulant sodium citrate     [START ON 06/26/2022] cefTRIAXone (ROCEPHIN)  IV     dexmedetomidine (PRECEDEX) IV infusion Stopped (06/05/2022 1154)   feeding supplement (VITAL 1.5 CAL) Stopped (06/17/2022 0640)   acetaminophen **OR** acetaminophen, albuterol, ALPRAZolam, alteplase, alum & mag hydroxide-simeth, anticoagulant sodium citrate, guaiFENesin-dextromethorphan, lidocaine (PF), lidocaine-prilocaine, ondansetron **OR** ondansetron (ZOFRAN) IV, mouth rinse, pentafluoroprop-tetrafluoroeth, polyethylene glycol, sodium chloride

## 2022-06-26 ENCOUNTER — Other Ambulatory Visit: Payer: Self-pay

## 2022-06-26 ENCOUNTER — Inpatient Hospital Stay (HOSPITAL_COMMUNITY): Payer: Medicare Other

## 2022-06-26 DIAGNOSIS — J9621 Acute and chronic respiratory failure with hypoxia: Secondary | ICD-10-CM | POA: Diagnosis not present

## 2022-06-26 LAB — GLUCOSE, CAPILLARY
Glucose-Capillary: 119 mg/dL — ABNORMAL HIGH (ref 70–99)
Glucose-Capillary: 134 mg/dL — ABNORMAL HIGH (ref 70–99)
Glucose-Capillary: 137 mg/dL — ABNORMAL HIGH (ref 70–99)
Glucose-Capillary: 145 mg/dL — ABNORMAL HIGH (ref 70–99)
Glucose-Capillary: 158 mg/dL — ABNORMAL HIGH (ref 70–99)
Glucose-Capillary: 251 mg/dL — ABNORMAL HIGH (ref 70–99)
Glucose-Capillary: 54 mg/dL — ABNORMAL LOW (ref 70–99)

## 2022-06-26 LAB — CBC
HCT: 25.5 % — ABNORMAL LOW (ref 39.0–52.0)
Hemoglobin: 8.3 g/dL — ABNORMAL LOW (ref 13.0–17.0)
MCH: 31.1 pg (ref 26.0–34.0)
MCHC: 32.5 g/dL (ref 30.0–36.0)
MCV: 95.5 fL (ref 80.0–100.0)
Platelets: 95 10*3/uL — ABNORMAL LOW (ref 150–400)
RBC: 2.67 MIL/uL — ABNORMAL LOW (ref 4.22–5.81)
RDW: 21.5 % — ABNORMAL HIGH (ref 11.5–15.5)
WBC: 13.7 10*3/uL — ABNORMAL HIGH (ref 4.0–10.5)
nRBC: 1.1 % — ABNORMAL HIGH (ref 0.0–0.2)

## 2022-06-26 LAB — RENAL FUNCTION PANEL
Albumin: 2.1 g/dL — ABNORMAL LOW (ref 3.5–5.0)
Anion gap: 8 (ref 5–15)
BUN: 43 mg/dL — ABNORMAL HIGH (ref 8–23)
CO2: 26 mmol/L (ref 22–32)
Calcium: 8.1 mg/dL — ABNORMAL LOW (ref 8.9–10.3)
Chloride: 97 mmol/L — ABNORMAL LOW (ref 98–111)
Creatinine, Ser: 2.53 mg/dL — ABNORMAL HIGH (ref 0.61–1.24)
GFR, Estimated: 26 mL/min — ABNORMAL LOW (ref >60)
Glucose, Bld: 51 mg/dL — ABNORMAL LOW (ref 70–99)
Phosphorus: 3.7 mg/dL (ref 2.5–4.6)
Potassium: 3.6 mmol/L (ref 3.5–5.1)
Sodium: 131 mmol/L — ABNORMAL LOW (ref 135–145)

## 2022-06-26 LAB — HEPARIN INDUCED PLATELET AB (HIT ANTIBODY): Heparin Induced Plt Ab: 0.051 OD (ref 0.000–0.400)

## 2022-06-26 MED ORDER — METOPROLOL TARTRATE 5 MG/5ML IV SOLN
2.5000 mg | INTRAVENOUS | Status: AC | PRN
  Administered 2022-06-27: 2.5 mg via INTRAVENOUS
  Filled 2022-06-26: qty 5

## 2022-06-26 MED ORDER — LACTULOSE 10 GM/15ML PO SOLN
30.0000 g | Freq: Three times a day (TID) | ORAL | Status: DC
  Filled 2022-06-26: qty 45

## 2022-06-26 MED ORDER — THIAMINE MONONITRATE 100 MG PO TABS
100.0000 mg | ORAL_TABLET | Freq: Every day | ORAL | Status: DC
  Administered 2022-06-26 – 2022-06-27 (×2): 100 mg
  Filled 2022-06-26 (×2): qty 1

## 2022-06-26 MED ORDER — POLYETHYLENE GLYCOL 3350 17 G PO PACK: 17.0000 g | PACK | Freq: Every day | ORAL | Status: DC | PRN

## 2022-06-26 MED ORDER — ACETAMINOPHEN 325 MG PO TABS
650.0000 mg | ORAL_TABLET | Freq: Four times a day (QID) | ORAL | Status: DC | PRN
  Administered 2022-06-28: 650 mg
  Filled 2022-06-26: qty 2

## 2022-06-26 MED ORDER — DEXTROSE 50 % IV SOLN
25.0000 g | INTRAVENOUS | Status: AC
  Administered 2022-06-26: 25 g via INTRAVENOUS
  Filled 2022-06-26: qty 50

## 2022-06-26 MED ORDER — ALPRAZOLAM 0.5 MG PO TABS: 0.5000 mg | ORAL_TABLET | Freq: Every day | ORAL | Status: DC | PRN

## 2022-06-26 MED ORDER — INSULIN ASPART 100 UNIT/ML IJ SOLN
0.0000 [IU] | INTRAMUSCULAR | Status: DC
  Administered 2022-06-26: 1 [IU] via SUBCUTANEOUS
  Administered 2022-06-26: 2 [IU] via SUBCUTANEOUS
  Administered 2022-06-27: 6 [IU] via SUBCUTANEOUS

## 2022-06-26 MED ORDER — GUAIFENESIN-DM 100-10 MG/5ML PO SYRP: 5.0000 mL | ORAL_SOLUTION | ORAL | Status: DC | PRN

## 2022-06-26 MED ORDER — POLYSACCHARIDE IRON COMPLEX 150 MG PO CAPS
150.0000 mg | ORAL_CAPSULE | Freq: Every day | ORAL | Status: DC
  Administered 2022-06-27 – 2022-06-30 (×4): 150 mg
  Filled 2022-06-26 (×4): qty 1

## 2022-06-26 MED ORDER — RIFAXIMIN 550 MG PO TABS
550.0000 mg | ORAL_TABLET | Freq: Two times a day (BID) | ORAL | Status: DC
  Administered 2022-06-26 – 2022-06-30 (×9): 550 mg
  Filled 2022-06-26 (×11): qty 1

## 2022-06-26 MED ORDER — VENLAFAXINE HCL 75 MG PO TABS
75.0000 mg | ORAL_TABLET | Freq: Three times a day (TID) | ORAL | Status: DC
  Administered 2022-06-26 – 2022-06-27 (×4): 75 mg
  Filled 2022-06-26 (×5): qty 1

## 2022-06-26 MED ORDER — SULFAMETHOXAZOLE-TRIMETHOPRIM 800-160 MG PO TABS: 1.0000 | ORAL_TABLET | ORAL | Status: DC

## 2022-06-26 MED ORDER — ALUM & MAG HYDROXIDE-SIMETH 200-200-20 MG/5ML PO SUSP: 15.0000 mL | ORAL | Status: DC | PRN

## 2022-06-26 MED ORDER — ACETAMINOPHEN 650 MG RE SUPP: 650.0000 mg | Freq: Four times a day (QID) | RECTAL | Status: DC | PRN

## 2022-06-26 MED ORDER — METOPROLOL TARTRATE 12.5 MG HALF TABLET: 12.5000 mg | ORAL_TABLET | Freq: Two times a day (BID) | ORAL | Status: DC

## 2022-06-26 MED ORDER — AMIODARONE HCL IN DEXTROSE 360-4.14 MG/200ML-% IV SOLN
30.0000 mg/h | INTRAVENOUS | Status: DC
  Administered 2022-06-26: 30 mg/h via INTRAVENOUS
  Filled 2022-06-26: qty 200

## 2022-06-26 MED ORDER — PRAVASTATIN SODIUM 10 MG PO TABS
20.0000 mg | ORAL_TABLET | Freq: Every morning | ORAL | Status: DC
  Administered 2022-06-26 – 2022-06-30 (×5): 20 mg
  Filled 2022-06-26 (×6): qty 2

## 2022-06-26 MED ORDER — INSULIN ASPART 100 UNIT/ML IJ SOLN: 0.0000 [IU] | Freq: Three times a day (TID) | INTRAMUSCULAR | Status: DC

## 2022-06-26 MED ORDER — METOPROLOL TARTRATE 12.5 MG HALF TABLET
12.5000 mg | ORAL_TABLET | Freq: Two times a day (BID) | ORAL | Status: DC
  Administered 2022-06-26 – 2022-06-30 (×8): 12.5 mg
  Filled 2022-06-26 (×8): qty 1

## 2022-06-26 MED ORDER — ALLOPURINOL 100 MG PO TABS
100.0000 mg | ORAL_TABLET | ORAL | Status: DC
  Administered 2022-06-26 – 2022-06-29 (×2): 100 mg
  Filled 2022-06-26 (×3): qty 1

## 2022-06-26 MED ORDER — LACTULOSE 10 GM/15ML PO SOLN
30.0000 g | Freq: Three times a day (TID) | ORAL | Status: DC
  Administered 2022-06-26 – 2022-06-27 (×4): 30 g
  Filled 2022-06-26 (×3): qty 45

## 2022-06-26 MED ORDER — RENA-VITE PO TABS
1.0000 | ORAL_TABLET | Freq: Every day | ORAL | Status: DC
  Administered 2022-06-26 – 2022-06-29 (×4): 1
  Filled 2022-06-26 (×4): qty 1

## 2022-06-26 MED ORDER — VITAL 1.5 CAL PO LIQD
1000.0000 mL | ORAL | Status: DC
  Administered 2022-06-26 – 2022-06-29 (×2): 1000 mL
  Filled 2022-06-26 (×5): qty 1000

## 2022-06-26 MED ORDER — INSULIN ASPART 100 UNIT/ML IJ SOLN: 0.0000 [IU] | Freq: Every day | INTRAMUSCULAR | Status: DC

## 2022-06-26 MED ORDER — MIRTAZAPINE 30 MG PO TABS
30.0000 mg | ORAL_TABLET | Freq: Every day | ORAL | Status: DC
  Administered 2022-06-26: 30 mg
  Filled 2022-06-26 (×3): qty 1

## 2022-06-26 MED ORDER — SULFAMETHOXAZOLE-TRIMETHOPRIM 800-160 MG PO TABS
1.0000 | ORAL_TABLET | ORAL | Status: DC
  Administered 2022-06-26: 1
  Filled 2022-06-26: qty 1

## 2022-06-26 MED ORDER — AMIODARONE HCL 200 MG PO TABS
100.0000 mg | ORAL_TABLET | Freq: Every day | ORAL | Status: DC
  Filled 2022-06-26: qty 1

## 2022-06-26 NOTE — Procedures (Signed)
HD Note:  Some information was entered later than the data was gathered due to patient care needs. The stated time with the data is accurate.  Patient receiving treatment at bedside. Alert and oriented, although very sleepy and soft spoken.   Informed consent signed and in chart.   Patient in pain on his sacral area.  Turning and reposition done.  Movement causes patient pain as well. Patient informed of any movement prior to it occurring.  Patient was tachycardic and SBP trended down during treatment.  UF was cut off to allow for recovery of both values to normalized rates. Patient alert and complaining of pain to his sacrum Hand-off given to patient's nurse.   Access used: Left upper arm fistula Access issues: None  Total UF removed: 300 ml     [facility]

## 2022-06-26 NOTE — Progress Notes (Signed)
Nutrition Follow-up  DOCUMENTATION CODES:   Severe malnutrition in context of chronic illness  INTERVENTION:   Tube Feeding via Cortrak:  Vital 1.5 at 60 ml/hr Pro-Source TF20 60 mL daily This provides 117 g of protein, 2240 kcals and 1094 mL of free water  Continue Thiamine, Rena-Vite  NUTRITION DIAGNOSIS:   Severe Malnutrition related to chronic illness as evidenced by severe fat depletion, severe muscle depletion.  Being addressed via TF   GOAL:   Patient will meet greater than or equal to 90% of their needs  Progressing  MONITOR:   Diet advancement, TF tolerance, Labs, Weight trends  REASON FOR ASSESSMENT:   Ventilator, Consult Enteral/tube feeding initiation and management  ASSESSMENT:   Pt with hx of atrial fibrillation, alcoholic cirrhosis with esophageal varices, ESRD on HD, DM type 2, GERD, HTN, and chronic respiratory on home O2 transferred to [facility] from [facility] after presenting for scheduled paracentesis but with complaints of SOB. No HD available at [facility].  1/15 - transferred to [facility] 1/18 - paracentesis, 2.3L removed 1/22 - rapid response, intubated and transferred to ICU 1/25 - extubated  Pt more somnolent today, oriented to person and place. Pt with AMS from hepatic encephalopathy, uremia and hypercarbia  TF had been titrated to goal rate of 55 ml/hr prior to extubation. No TF since extubation, lost enteral access  NPO currently, noted Cortrak has been ordered. Also noted SLP consult, reviewed SLP note and pt not safe to start po from a clinical standpoint.   Noted episodes of hypoglycemia, insulin on hold  Current wt 71.9 kg; noted weight of 65.7 kg on 1/24 Noted outpatient EDW 80.5 kg; significantly under EDW presently  +stool, rectal tube in place  Labs: sodium 131 (L), potassium 3.5 (wdl), phosphorus 3.7 (wdl) Meds: aranesp, iron polysaccharides, lactulose, remeron, Rena-vite, xifaxan, thiamine   Diet Order:    Diet Order     None       EDUCATION NEEDS:   Not appropriate for education at this time  Skin:  Skin Assessment: Reviewed RN Assessment (ecchymosis throughout body, skin tear left elbow)  Last BM:  1/25  Height:   Ht Readings from Last 1 Encounters:  06/23/22 '5\' 7"'$  (1.702 m)    Weight:   Wt Readings from Last 1 Encounters:  06/26/22 71.9 kg    Ideal Body Weight:  67.3 kg  BMI:  Body mass index is 24.83 kg/m.  Estimated Nutritional Needs:   Kcal:  2000-2200 kcal/d  Protein:  100-120g/d  Fluid:  1L+UOP   Kerman Passey MS, RDN, LDN, CNSC Registered Dietitian 3 Clinical Nutrition RD Pager and On-Call Pager Number Located in [place]

## 2022-06-26 NOTE — Progress Notes (Signed)
Inpatient Diabetes Program Recommendations  AACE/ADA: New Consensus Statement on Inpatient Glycemic Control (2015)  Target Ranges:  Prepandial:   less than 140 mg/dL      Peak postprandial:   less than 180 mg/dL (1-2 hours)      Critically ill patients:  140 - 180 mg/dL   Lab Results  Component Value Date   GLUCAP 134 (H) 06/26/2022   HGBA1C 6.1 (H) 04/23/2022    Review of Glycemic Control  Latest Reference Range & Units 06/26/22 03:04 06/26/22 03:41 06/26/22 07:21  Glucose-Capillary 70 - 99 mg/dL 54 (L) 119 (H) 134 (H)   Diabetes history: DM  Outpatient Diabetes medications:  Toujeo 120 units QHS , Humalog 50 units TID  Current orders for Inpatient glycemic control:  None- insulins held due to hypoglycemia  Inpatient Diabetes Program Recommendations:    Note that coretrak placed today.  Once tube feeds resumed, will need Novolog correction resumed as well as tube feed coverage.   Thanks,  Adah Perl, RN, BC-ADM Inpatient Diabetes Coordinator Pager [phone]  (8a-5p)

## 2022-06-26 NOTE — Progress Notes (Signed)
Inpatient Rehab Admissions Coordinator:   At this time we are recommending a CIR consult and I will place an order per our protocol.   Shann Medal, PT, DPT Admissions Coordinator [phone] 06/26/22  3:46 PM

## 2022-06-26 NOTE — Procedures (Signed)
Cortrak  Person Inserting Tube:  Viridiana Spaid D, RD Tube Type:  Cortrak - 43 inches Tube Size:  10 Tube Location:  Left nare Secured by: Bridle Technique Used to Measure Tube Placement:  Marking at nare/corner of mouth Cortrak Secured At:  64 cm Procedure Comments:  Cortrak Tube Team Note:  Consult received to place a Cortrak feeding tube.   X-ray is required, abdominal x-ray has been ordered by the Cortrak team. Please confirm tube placement before using the Cortrak tube.   If the tube becomes dislodged please keep the tube and contact the Cortrak team at [email or web address] for replacement.  If after hours and replacement cannot be delayed, place a NG tube and confirm placement with an abdominal x-ray.    Sofiya Ezelle, RD, LDN Clinical Dietitian RD pager # available in [facility]ION  After hours/weekend pager # available in AMION    

## 2022-06-26 NOTE — Progress Notes (Signed)
[facility] Progress Note   Subjective:  had HD today in ICU, net uf 300 ml.     Objective Vitals:   06/26/22 1200 06/26/22 1300 06/26/22 1400 06/26/22 1511  BP: (!) 106/55 (!) 136/56 (!) 139/56   Pulse: (!) 107 (!) 107 (!) 110   Resp: (!) 26 (!) 26 (!) 28   Temp:    97.9 F (36.6 C)  TempSrc:    Oral  SpO2: 96% 98% 98%   Weight:      Height:       Physical Exam General: chronically ill appearing HEENT: orally intubated  Heart: RRR  Lungs: BS decreased,  Abdomen: soft, NT Extremities: no LE edema, 1+ UE edema Dialysis Access: LU AVF +b/t   Dialysis Orders: [facility] MWF   4h  80.5kg  350/500  2/2.5 bath  AVF  Hep none  Mircera 200 (due 1/15)      CXR 1/23 - Mild, stable bibasilar airspace disease is seen with small, stable bilateral pleural effusions  Assessment/Plan: Dyspnea/respiratory distress -secondary to volume overload/Influenza A +--initially requiring BiPAP on admit, then improved, then decompensated again and required intubation on 1/22. Had HD 1/23 and 1/24. Extubated now.  Encephalopathy - hepatic / metabolic cause, lactulose/rifaximin per primary.  ESRD -  HD MWF. HD today on schedule.  Shock/ hypotension - low dose pressors are off and midodrine dc'd, BP's good Afib /RVR - on amio, no a/c w/ possible GI bleed Possible GI bleed - sp octreotide, PPI gtt, changed to PPI bid now Volume - wt's erratic, 8kg under dry wt today. Keep even w/ HD today.  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  Metabolic bone disease -  Ca and phos in goal. No VDRA as outpatient  Nutrition - Renal diet with fluid restriction.  Hx of cirrhosis with ascites - per primary team, sp paracentesis with 2.3L 1/19 H/o etoh abuse Flu A + - completed course of Tamiflu  DNR - pt is now DNR status  Kelly Splinter, MD 06/26/2022, 3:58 PM  Recent Labs  Lab 06/05/2022 0440 06/09/2022 0920 06/26/22 0221  HGB 8.5* 8.5* 8.3*  ALBUMIN 2.2*  --  2.1*  CALCIUM 7.8*  --   8.1*  PHOS 2.0*  --  3.7  CREATININE 1.79*  --  2.53*  K 3.5  --  3.6     Inpatient medications:  allopurinol  100 mg Per Tube Q M,W,F-2000   Chlorhexidine Gluconate Cloth  6 each Topical Q0600   Chlorhexidine Gluconate Cloth  6 each Topical Q0600   Chlorhexidine Gluconate Cloth  6 each Topical Q0600   [START ON 01-Jul-2022] darbepoetin (ARANESP) injection - DIALYSIS  200 mcg Subcutaneous Q Tue-1800   feeding supplement (PROSource TF20)  60 mL Per Tube Daily   insulin aspart  0-6 Units Subcutaneous Q4H   [START ON 06/27/2022] iron polysaccharides  150 mg Per Tube Daily   lactulose  30 g Per Tube TID   metoprolol tartrate  12.5 mg Per Tube BID   mirtazapine  30 mg Per Tube QHS   multivitamin  1 tablet Per Tube QHS   mouth rinse  15 mL Mouth Rinse Q2H   pantoprazole  40 mg Intravenous Q12H   pravastatin  20 mg Per Tube q AM   rifaximin  550 mg Per Tube BID   sulfamethoxazole-trimethoprim  1 tablet Per Tube Weekly   thiamine  100 mg Per Tube Daily   venlafaxine  75 mg  Per Tube TID WC    sodium chloride Stopped (06/23/22 0834)   anticoagulant sodium citrate     feeding supplement (VITAL 1.5 CAL)     acetaminophen **OR** acetaminophen, albuterol, ALPRAZolam, alteplase, alum & mag hydroxide-simeth, anticoagulant sodium citrate, guaiFENesin-dextromethorphan, lidocaine (PF), lidocaine-prilocaine, ondansetron **OR** ondansetron (ZOFRAN) IV, mouth rinse, pentafluoroprop-tetrafluoroeth, polyethylene glycol, sodium chloride

## 2022-06-26 NOTE — Progress Notes (Addendum)
NAME:  Jeffrey [name], MRN:  [id number], DOB:  1946/04/18, LOS: 56 ADMISSION DATE:  06/04/2022, CONSULTATION DATE:  06/22/22 REFERRING MD:  Sabino Gasser TRH, CHIEF COMPLAINT:  AMS   History of Present Illness:  77 yo M PMH ESRD on MWF HD, alcoholic cirrhosis, DM2, Chronic hypoxic respiratory failure on 2L, Afib, cirrhosis was admitted 1/15 for acute on chronic hypoxic resp failure, Flu PNA.   On  1/22 he was more lethargic. While in iHD he was started on BiPAP but remained lethargic.  ABG with resp acidosis   PCCM consulted for intubation in this setting   Pertinent  Medical History   Past Medical History:  Diagnosis Date   A-fib [facility])    when initially stating dialysis january 2021 at [facility] per spouse; never heard anything else about it   Alcoholic cirrhosis [facility])    patient reports completing hep a and b vaccines in 2006   Anemia    has had 3 units of prbcs 2013   Anxiety    Arthritis    Asymptomatic gallstones    Ultrasound in 2006   B12 deficiency 11/21/2020   C. difficile colitis    Cholelithiasis    Chronic kidney disease    Dialysis M/W/F/Sa   Depression    Diabetes ([place])    Duodenal ulcer with hemorrhage    per patient in 2006 or 2007, records have been requested   Dyspnea    low oxygen level -  has O2 2 L all day   Esophageal varices ([place])    see PSH   GERD (gastroesophageal reflux disease)    Heart burn    History of alcohol abuse    quit 05/2013   HOH (hard of hearing)    Hypertension    Liver cirrhosis ([place])    ETOH   SBP (spontaneous bacterial peritonitis) ([place])    2020, November 2021.    Splenomegaly    Ultrasound in 2006    Significant Hospital Events: Including procedures, antibiotic start and stop dates in addition to other pertinent events   1/15 admit to [facility] from [facility] for iHD need 1/15 nephro consult, HD  1/16 Flu A pos, tamiflu  1/18 para, no SBP  1/19 steroids added for incr O2 need  1/22 PCCM consulted for AMS hypercarbic resp  failure, SOB. Transfer to ICU for intubation   Interim History / Subjective:  Patient is awake and alert but more somnolent today.  He is oriented to person and place but not to time.  He denies any discomfort.  He would like to get out of bed if able.  Objective   Blood pressure (!) 146/54, pulse 93, temperature 98.1 F (36.7 C), temperature source Oral, resp. rate (!) 21, height '5\' 7"'$  (1.702 m), weight 71.9 kg, SpO2 99 %.    Vent Mode: PSV;CPAP FiO2 (%):  [36 %-40 %] 36 % Pressure Support:  [8 cmH20] 8 cmH20   Intake/Output Summary (Last 24 hours) at 06/26/2022 0730 Last data filed at 06/26/2022 0000 Gross per 24 hour  Intake 275.49 ml  Output 98 ml  Net 177.49 ml    Filed Weights   06/23/22 2012 06/24/22 0500 06/26/22 0500  Weight: 65 kg 65.7 kg 71.9 kg    Examination: Physical exam: General: Chronically ill male, cachetic HEENT: Nasal cannula.  Soft voice.  Right IJ in place.  Surrounding skin appears slightly erythematous Neuro: Awake and alert.  Oriented to person and place Chest: no wheezes  Heart: RRR,  3/6 systolic murmur heard best at the lower sternal border.  No LE edema Abdomen: Soft, nondistended, bowel sounds present Skin: No rash   Resolved Hospital Problem list   Shock  Assessment & Plan:  Acute metabolic encephalopathy Multifactorial from uremia, hypercarbia and hepatic encephalopathy.   His mentation slightly worsens today due to not getting lactulose overnight and this morning.  Will request SLP to evaluate him again this morning.  If not, we will place an NG tube for lactulose. -Continue lactulose and rifaximin.   -HD today as scheduled  Acute on chronic respiratory failure with hypoxia and hypercapnia Influenza A PNA Patient uses 2 L oxygen at baseline.  Successfully extubated yesterday and currently satting well on 4 L -No indication for antibiotic or steroid  Acute on chronic deficiency anemia History of GI bleed Patient had 2 EGD in  September 2023 and had APC for bleeding gastropathy.  He only has grade 1 varices.  He had a colonoscopy in 12/2018 through which had a possible bleeding polypoid lesion in the splenic flexure. Hemoglobin stable at 8.3.  No EGD per GI. -Continue PPI 40 mg twice daily -Iron storage appears adequate.  Continue p.o. supplementation  Shock of multifactorial causes-resolved -Discontinue midodrine  Thrombocytopenia His baseline platelets around 100s. 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  He has other explanation for thrombocytopenia including cirrhosis, ESRD and acute illness. Platelet trending upward to 95.  Peripheral blood smear unremarkable, no schistocytes to suspect DIC. -Pending HIT antibody results -SCD for DVT proph  A-fib with RVR -Amiodarone 100 mg daily -He is not on anticoagulation at home.  Not a candidate for DOAC given his ESRD.  Warfarin may be his only choice.  His HAS-BLED score is 5 indicates high risk of bleeding. This will be a risk vs benefit conversation with family.   ESRD on HD MWF -Nephrology is following  Decompensated alcoholic cirrhosis with ascites with esophageal varices and encephalopathy Patient had paracentesis done on 1/18, which was negative for SBP Continue lactulose, rifaximin, inderol   -Continue ceftriaxone for SBP prophylaxis.  Convert back to Bactrim when can take p.o.  Hypervolemic hyponatremia Monitor electrolytes  DM2 -Holding sliding scale insulin while NPO.  Best Practice (right click and "Reselect all SmartList Selections" daily)   Diet/type: NPO  DVT prophylaxis: SCD GI prophylaxis: IV PPI Lines: RIJ. Remove today Foley:  NA Code Status:  DNR Last date of multidisciplinary goals of care discussion [1/23}  Labs   CBC: Recent Labs  Lab 06/20/22 0344 06/21/22 0403 06/22/22 0333 06/22/22 1808 06/23/22 0818 06/23/22 0926 06/24/22 0443 06/24/22 1640 06/16/2022 0440 06/11/2022 0920 06/26/22 0221  WBC 5.5 7.8 5.4   < > 18.8*  --  8.7  6.8 7.4 7.2 13.7*  NEUTROABS 5.0 7.5 5.1  --  18.2*  --   --   --   --  6.9  --   HGB 8.2* 8.2* 7.4*   < > 7.0*   < > 5.6* 8.0* 8.5* 8.5* 8.3*  HCT 26.4* 25.4* 23.8*   < > 21.9*   < > 17.0* 23.7* 25.8* 25.0* 25.5*  MCV 101.5* 100.0 100.8*   < > 96.9  --  98.3 94.8 94.9 95.4 95.5  PLT 105* 102* 84*   < > 130*  --  80* 60* 57* 53* 95*   < > = values in this interval not displayed.     Basic Metabolic Panel: Recent Labs  Lab 06/22/22 1808 06/23/22 0818 06/23/22 3557 06/23/22 2027 06/24/22 3220  06/24/22 1640 06/14/2022 0440 06/26/22 0221  NA 132* 128* 130*  --  131*  --  131* 131*  K 3.9 4.3 4.5  --  3.5  --  3.5 3.6  CL 92* 90*  --   --  94*  --  95* 97*  CO2 26 24  --   --  26  --  26 26  GLUCOSE 193* 209*  --   --  245*  --  187* 51*  BUN 99* 114*  --   --  55*  --  28* 43*  CREATININE 4.06* 4.53*  --   --  2.74*  --  1.79* 2.53*  CALCIUM 9.0 9.0  --   --  8.5*  --  7.8* 8.1*  MG  --  2.1  --  1.7 1.9 1.5* 2.3  --   PHOS 4.4 2.5  --  1.7* 1.9*  1.8* 1.1* 2.0* 3.7    GFR: Estimated Creatinine Clearance: 23.6 mL/min (A) (by C-G formula based on SCr of 2.53 mg/dL (H)). Recent Labs  Lab 06/24/22 1640 06/28/2022 0440 06/23/2022 0920 06/26/22 0221  WBC 6.8 7.4 7.2 13.7*     Liver Function Tests: Recent Labs  Lab 06/22/22 1808 06/23/22 0818 06/24/22 0443 06/07/2022 0440 06/26/22 0221  ALBUMIN 2.2* 2.0* 2.5* 2.2* 2.1*    No results for input(s): "LIPASE", "AMYLASE" in the last 168 hours. Recent Labs  Lab 06/20/22 0344 06/23/22 0818  AMMONIA 82* 91*     ABG    Component Value Date/Time   PHART 7.28 (L) 06/22/2022 1810   PCO2ART 61 (H) 06/22/2022 1810   PO2ART 282 (H) 06/22/2022 1810   HCO3 27.1 06/23/2022 0926   TCO2 29 06/23/2022 0926   ACIDBASEDEF 5.6 (H) 06/22/2022 1302   O2SAT 31 06/23/2022 0926     Coagulation Profile: Recent Labs  Lab 06/24/22 0759  INR 1.7*     Cardiac Enzymes: No results for input(s): "CKTOTAL", "CKMB", "CKMBINDEX",  "TROPONINI" in the last 168 hours.  HbA1C: Hgb A1c MFr Bld  Date/Time Value Ref Range Status  04/23/2022 02:40 AM 6.1 (H) 4.8 - 5.6 % Final    Comment:    (NOTE)         Prediabetes: 5.7 - 6.4         Diabetes: >6.4         Glycemic control for adults with diabetes: <7.0   07/29/2021 05:22 AM 5.5 4.8 - 5.6 % Final    Comment:    (NOTE) Pre diabetes:          5.7%-6.4%  Diabetes:              >6.4%  Glycemic control for   <7.0% adults with diabetes     CBG: Recent Labs  Lab 06/09/2022 2303 06/26/22 0036 06/26/22 0304 06/26/22 0341 06/26/22 0721  GLUCAP 172* 145* 54* 119* 134*     Review of Systems:   Unable to obtain due to encephalopathy   Past Medical History:  He,  has a past medical history of A-fib ([place]), Alcoholic cirrhosis ([place]), Anemia, Anxiety, Arthritis, Asymptomatic gallstones, B12 deficiency (11/21/2020), C. difficile colitis, Cholelithiasis, Chronic kidney disease, Depression, Diabetes ([place]), Duodenal ulcer with hemorrhage, Dyspnea, Esophageal varices ([place]), GERD (gastroesophageal reflux disease), Heart burn, History of alcohol abuse, HOH (hard of hearing), Hypertension, Liver cirrhosis ([employer]), SBP (spontaneous bacterial peritonitis) ([place]), and Splenomegaly.   Surgical History:   Past Surgical History:  Procedure Laterality Date   AGILE CAPSULE N/A 09/20/2019  Procedure: AGILE CAPSULE;  Surgeon: Daneil Dolin, MD;  [facility];  Service: Endoscopy;  Laterality: N/A;   APPENDECTOMY     AV FISTULA PLACEMENT Left 08/17/2019   Procedure: ARTERIOVENOUS (AV) FISTULA CREATION VERSES ARTERIOVENOUS GRAFT;  Surgeon: [name], MD;  Location: [facility];  Service: Vascular;  Laterality: Left;   BASCILIC VEIN TRANSPOSITION Left 09/28/2019   Procedure: LEFT SECOND STAGE [place];  Surgeon: [name], MD;  Location: [facility];  Service: Vascular;  Laterality: Left;   BIOPSY  02/03/2018   Procedure: BIOPSY;  Surgeon: Daneil Dolin,  MD;  [facility];  Service: Endoscopy;;  bx of gastric polyps   BIOPSY  09/19/2019   Procedure: BIOPSY;  Surgeon: Danie Binder, MD;  [facility];  Service: Endoscopy;;   BIOPSY  01/16/2021   Procedure: BIOPSY;  Surgeon: Daneil Dolin, MD;  [facility];  Service: Endoscopy;;   CATARACT EXTRACTION Right    COLONOSCOPY  03/10/2005   Rectal polyp as described above, removed with snare. Left sided  diverticula. The remainder of the colonic mucosa appeared normal. Inflammed polyp on path.   COLONOSCOPY  04/1999   Dr. Thea Silversmith polyps removed,  Path showed hyperplastic   COLONOSCOPY  10/2015   Dr. Britta Mccreedy: diverticulosis, single sessile tubular adenoma 3-102m in size removed from descending colon.    COLONOSCOPY WITH ESOPHAGOGASTRODUODENOSCOPY (EGD)  03/31/2012   RTKW:IOXBDZHAVMs. Colonic diverticulosis. tubular adenoma colon   COLONOSCOPY WITH ESOPHAGOGASTRODUODENOSCOPY (EGD)  06/2019   FORSYTH: small esophageal varices without high risk stigmata, single large AVM on lesser curvature in gastric body s/p APC ablation, gastric antral and duodenal bulb polyposis. No significant source to explain transfusion dependent anemia. Colonoscopy with portal colopathy and diffuse edema, changes of Cdiff colitis on colonoscopy   COLONOSCOPY WITH PROPOFOL N/A 01/19/2021   Surgeon: CDaryel November MD; 15 mm polyp in descending colon removed piecemeal, 6 mm polyp in ascending colon and 12 mm polyp in sigmoid colon not removed due to high bleeding risk, benign polypoid lesion at splenic flexure, diverticulosis in sigmoid and descending colon, rectal varices.   COLONOSCOPY WITH PROPOFOL N/A 01/16/2021   Surgeon: RDaneil Dolin MD;  Portal colopathy, large rectal varices, polypoid mass at splenic flexure biopsy, two 1 cm left colon polyps not removed, left-sided diverticulosis.  Reported patient had bleeding from splenic flexure lesion.  Pathology with acutely inflamed  colonic mucosa with localized ulceration and prominent reactive changes.   ESOPHAGEAL BANDING N/A 02/03/2018   Procedure: ESOPHAGEAL BANDING;  Surgeon: RDaneil Dolin MD;  [facility];  Service: Endoscopy;  Laterality: N/A;   ESOPHAGEAL BANDING  01/16/2021   Procedure: ESOPHAGEAL BANDING;  Surgeon: RDaneil Dolin MD;  [facility];  Service: Endoscopy;;   ESOPHAGOGASTRODUODENOSCOPY  01/15/2005   Three columns grade 1 to 2 esophageal varices, otherwise normal esophageal mucosa.  Esophagus was not manipulated otherwise./Nodularity of the antrum with overlying erosions, nonspecific finding. Path showed rare H.pylori   ESOPHAGOGASTRODUODENOSCOPY  09/2008   Dr. RGaylene Brookscolumns of grade 2-3 esoph varices, only one column was prominent. Portal gastropathy, multiple gastrc polyps at antrum, two were 2cm with black eschar, bulbar polyps, bulbar erosions   ESOPHAGOGASTRODUODENOSCOPY  11/2004   Dr. FBrantley Stage3 esoph varices   ESOPHAGOGASTRODUODENOSCOPY  03/31/2012   RMR: 4 columns(3-GR 2, 1-GR1) non-bleeding esophageal varices, portal gastropathy, small HH, early GAVE, multiple gastric polyps    ESOPHAGOGASTRODUODENOSCOPY (EGD) WITH PROPOFOL N/A  02/03/2018   Dr. Gala Romney: Esophageal varices, 3 columns grade 1-2.  Portal hypertensive gastropathy.  Multiple gastric polyps, biopsy consistent with hyperplastic.   ESOPHAGOGASTRODUODENOSCOPY (EGD) WITH PROPOFOL N/A 09/19/2019   Fields: grade I and II esophageal varcies, mild portal hypertensive gastropathy, moderate gastritis but no H. pylori, single hyperplastic gastric polyp removed, obvious source for melena/transfusion dependent anemia not identified, may be due to friable gastric and duodenal mucosa in the setting of portal hypertension   ESOPHAGOGASTRODUODENOSCOPY (EGD) WITH PROPOFOL N/A 01/16/2021   Surgeon: Daneil Dolin, MD; Portal hypertensive gastropathy, grade 2 esophageal varices with bleeding stigmata s/p  banding x4, slight nodularity and congestion involving bulb and second portion of duodenum, nonspecific.  Recommended repeat EGD in 4 weeks.   ESOPHAGOGASTRODUODENOSCOPY (EGD) WITH PROPOFOL N/A 05/22/2021   stigmata of prior esophageal banding and no significant varices seen. Portal gastropathy. Polypoid gastric and duodenal mucosa with overlying erosions, marked mucosal friability.   ESOPHAGOGASTRODUODENOSCOPY (EGD) WITH PROPOFOL N/A 02/06/2022   Procedure: ESOPHAGOGASTRODUODENOSCOPY (EGD) WITH PROPOFOL;  Surgeon: Harvel Quale, MD;  [facility];  Service: Gastroenterology;  Laterality: N/A;   GASTRIC VARICES BANDING  03/31/2012   Procedure: GASTRIC VARICES BANDING;  Surgeon: Daneil Dolin, MD;  [facility];  Service: Endoscopy;  Laterality: N/A;   GIVENS CAPSULE STUDY N/A 09/21/2019   Poor study with a lot of debris obstructing much of the view of the first approximate 3 hours out of 6 hours.  No obvious source of bleeding identified.  Sequela of bleeding and old blood seen in the form of"whisps" of blood, blood flecks mostly toward the end of the study.   HEMOSTASIS CLIP PLACEMENT  01/19/2021   Procedure: HEMOSTASIS CLIP PLACEMENT;  Surgeon: [name], MD;  Location: [facility] ENDOSCOPY;  Service: Gastroenterology;;   HEMOSTASIS CLIP PLACEMENT  02/06/2022   Procedure: HEMOSTASIS CLIP PLACEMENT;  Surgeon: Montez Morita, Quillian Quince, MD;  [facility];  Service: Gastroenterology;;  gastric body   HOT HEMOSTASIS  02/06/2022   Procedure: HOT HEMOSTASIS (ARGON PLASMA COAGULATION/BICAP);  Surgeon: Montez Morita, Quillian Quince, MD;  [facility];  Service: Gastroenterology;;   IR PARACENTESIS  06/18/2022   IR REMOVAL TUN CV CATH W/O FL  02/15/2020   POLYPECTOMY  09/19/2019   Procedure: POLYPECTOMY;  Surgeon: Danie Binder, MD;  [facility];  Service: Endoscopy;;   POLYPECTOMY  01/19/2021   Procedure: POLYPECTOMY;  Surgeon: [name], MD;  Location: [facility] ENDOSCOPY;  Service: Gastroenterology;;   UMBILICAL HERNIA REPAIR  2017   exploratory laparotomy, abdominal washout     Social History:   reports that he quit smoking about 36 years ago. His smoking use included cigarettes. He has a 50.00 pack-year smoking history. He has never used smokeless tobacco. He reports that he does not drink alcohol and does not use drugs.   Family History:  His family history includes Breast cancer in his sister; Colon cancer (age of onset: 28) in his father; Diabetes in his sister; Healthy in his daughter and son; Stroke in his mother. There is no history of Lung cancer or Ovarian cancer.   Allergies Allergies  Allergen Reactions   Tape Other (See Comments)    Very thin skin, tears easily     Home Medications  Prior to Admission medications   Medication Sig Start Date End Date Taking? Authorizing Provider  acetaminophen (TYLENOL) 325 MG tablet Take 650 mg by mouth every 6 (six) hours as needed for mild pain,  moderate pain, fever or headache.   Yes [provider]  allopurinol (ZYLOPRIM) 300 MG tablet Take 300 mg by mouth in the morning. 12/11/21  Yes [provider]  ALPRAZolam Duanne Moron) 0.5 MG tablet Take 1 tablet (0.5 mg total) by mouth See admin instructions. Take one tablet (0.5 mg) by mouth on Monday, Wednesday, Friday before dialysis, may also take one tablet (0.5 mg) daily as needed for anxiety. 02/07/22  Yes Tat, Shanon Brow, MD  ASCORBIC ACID PO Take 1 tablet by mouth in the morning. Vitamin C, unknown strength.   Yes [provider]  Calcium Carbonate Antacid (TUMS PO) Take 2 tablets by mouth with breakfast, with lunch, and with evening meal.   Yes [provider]  cetirizine (ZYRTEC) 10 MG tablet Take 10 mg by mouth in the morning. 06/24/21  Yes [provider]  Cholecalciferol (VITAMIN D-3 PO) Take 1 capsule by mouth in the morning.   Yes [provider]  diltiazem (DILACOR XR)  120 MG 24 hr capsule Take 120 mg by mouth in the morning. 05/19/22  Yes [provider]  famotidine (PEPCID) 20 MG tablet Take 1 tablet (20 mg total) by mouth 2 (two) times daily. 07/30/21 07/30/22 Yes Barton Dubois, MD  furosemide (LASIX) 40 MG tablet Take 80 mg by mouth in the morning. 12/29/19  Yes [provider]  HUMALOG KWIKPEN 100 UNIT/ML KwikPen Inject 10-50 Units into the skin See admin instructions. Inject 10-50 units three times daily with meals per sliding scale. 04/23/21  Yes [provider]  HYDROcodone-acetaminophen (NORCO/VICODIN) 5-325 MG tablet Take 1 tablet by mouth daily as needed for severe pain.   Yes [provider]  hydrOXYzine (ATARAX/VISTARIL) 25 MG tablet Take 25 mg by mouth 3 (three) times daily. 11/23/20  Yes [provider]  iron polysaccharides (NIFEREX) 150 MG capsule Take 150 mg by mouth at bedtime.   Yes [provider]  Lactobacillus (ACIDOPHILUS PO) Take 1 capsule by mouth in the morning.   Yes [provider]  lactulose (CHRONULAC) 10 GM/15ML solution Take 15 mLs (10 g total) by mouth 2 (two) times daily. Patient taking differently: Take 10 g by mouth See admin instructions. 10g twice daily on Sunday, Tuesday, Thursday, Saturday 10g in the evening on Monday, Wednesday, Friday after dialysis. 02/07/22  Yes Tat, Shanon Brow, MD  metoprolol succinate (TOPROL-XL) 25 MG 24 hr tablet Take 1 tablet (25 mg total) by mouth daily. Patient taking differently: Take 25 mg by mouth See admin instructions. 25 mg twice daily on Sunday, Tuesday, Thursday, Saturday. 25 mg at bedtime on Monday, Wednesday, Friday (does not take before dialysis) 02/22/22  Yes Tat, Shanon Brow, MD  mirtazapine (REMERON) 30 MG tablet Take 1 tablet (30 mg total) by mouth at bedtime. 02/10/20  Yes Roxan Hockey, MD  Multiple Vitamin (MULTIVITAMIN WITH MINERALS) TABS tablet Take 1 tablet by mouth in the morning.    Yes [provider]  nitroGLYCERIN  (NITROGLYN) 2 % ointment Apply 2 inches topically once as needed for chest pain.   Yes [provider]  omega-3 acid ethyl esters (LOVAZA) 1 g capsule Take 2 g by mouth 2 (two) times daily.  04/07/19  Yes [provider]  ondansetron (ZOFRAN) 4 MG tablet Take 4 mg by mouth every 8 (eight) hours as needed for nausea or vomiting.   Yes [provider]  pantoprazole (PROTONIX) 40 MG tablet Take 1 tablet (40 mg total) by mouth 2 (two) times daily. 02/21/22  Yes TatShanon Brow,  MD  pravastatin (PRAVACHOL) 20 MG tablet Take 20 mg by mouth in the morning.    Yes [provider]  rifaximin (XIFAXAN) 550 MG TABS tablet Take 1 tablet (550 mg total) by mouth 2 (two) times daily. 05/02/22  Yes Emokpae, Courage, MD  SSD 1 % cream Apply 2 Applications topically 2 (two) times daily as needed (bed sores). 02/26/22  Yes [provider]  sulfamethoxazole-trimethoprim (BACTRIM DS) 800-160 MG tablet TAKE 1 TABLET BY MOUTH every friday AFTER dialysis Patient taking differently: Take 1 tablet by mouth See admin instructions. 1 tablet every Friday, after dialysis 03/12/22  Yes Annitta Needs, NP  thiamine (VITAMIN B-1) 100 MG tablet Take 100 mg by mouth in the morning. 08/27/20  Yes [provider]  tiZANidine (ZANAFLEX) 2 MG tablet Take 2 mg by mouth at bedtime as needed for muscle spasms. 04/07/22  Yes [provider]  TRESIBA FLEXTOUCH 200 UNIT/ML FlexTouch Pen Inject 10-50 Units into the skin See admin instructions. Inject 10-50 units once daily at bedtime as needed for high blood sugar levels, per sliding scale. 02/17/22  Yes [provider]  triamcinolone cream (KENALOG) 0.1 % Apply 1 application  topically 2 (two) times daily as needed (dry skin, itching). 12/30/17  Yes [provider]  venlafaxine (EFFEXOR) 75 MG tablet Take 75 mg by mouth 3 (three) times daily with meals.    Yes [provider]  zinc sulfate 220 (50 Zn) MG capsule Take 1  capsule (220 mg total) by mouth daily. Patient taking differently: Take 220 mg by mouth in the morning. 05/03/22  Yes Roxan Hockey, MD     Critical care time:      Gaylan Gerold, DO Internal Medicine Residency My pager: [phone]

## 2022-06-26 NOTE — Progress Notes (Signed)
PT Cancellation Note  Patient Details Name: Jeffrey [name] MRN: [id number] DOB: 1945-11-08   Cancelled Treatment:    Reason Eval/Treat Not Completed: Patient at procedure or test/unavailable (HD)   Toy Eisemann B Kelvyn Schunk 06/26/2022, 7:57 AM [place] Office: [phone]

## 2022-06-26 NOTE — Progress Notes (Signed)
Speech Language Pathology Treatment: Dysphagia  Patient Details Name: Jeffrey [name] MRN: [id number] DOB: 1946/04/01 Today's Date: 06/26/2022 Time: 0768-0881 SLP Time Calculation (min) (ACUTE ONLY): 11 min  Assessment / Plan / Recommendation Clinical Impression  Pt seen for dysphagia follow up and ability to start po's currently having HD in room (not CRRT). He was able to stay awake and follow commands for trials of thin water and applesauce. He continues to show signs of an pharyngeal and respiratory based dysphagia with immediate and delayed throat clears and increased work of breathing which continued with subsequent trials. He is deconditioned and not safe to start pt on po's from clinical standpoint and recommend Cortrak (dietitian currently placing), giving pt more time to recover post extubation. Will follow up next week for likely instrumental assessment of oropharyngeal swallow.    HPI HPI: 77 yo male admitted 06/17/2022 due to SOB/acute respiratory distress found to be positive for flu.1/22 respiratory distress and hypotensions during HD requiring intubation. ETT 1/22-25. PMH: DM, HTN, GIB, chronic resp failure on 2 L, Afib, ESRD on HD MWF, cirrhosis requiring periodic paracentesis.      SLP Plan  Continue with current plan of care      Recommendations for follow up therapy are one component of a multi-disciplinary discharge planning process, led by the attending physician.  Recommendations may be updated based on patient status, additional functional criteria and insurance authorization.    Recommendations  Diet recommendations: NPO Medication Administration: Via alternative means                Oral Care Recommendations: Oral care QID Follow Up Recommendations:  (TBD) SLP Visit Diagnosis: Dysphagia, unspecified (R13.10) Plan: Continue with current plan of care           Houston Siren  06/26/2022, 9:00 AM

## 2022-06-27 ENCOUNTER — Other Ambulatory Visit: Payer: Self-pay

## 2022-06-27 DIAGNOSIS — J9621 Acute and chronic respiratory failure with hypoxia: Secondary | ICD-10-CM | POA: Diagnosis not present

## 2022-06-27 LAB — GLUCOSE, CAPILLARY
Glucose-Capillary: 264 mg/dL — ABNORMAL HIGH (ref 70–99)
Glucose-Capillary: 274 mg/dL — ABNORMAL HIGH (ref 70–99)
Glucose-Capillary: 330 mg/dL — ABNORMAL HIGH (ref 70–99)
Glucose-Capillary: 331 mg/dL — ABNORMAL HIGH (ref 70–99)
Glucose-Capillary: 411 mg/dL — ABNORMAL HIGH (ref 70–99)
Glucose-Capillary: 421 mg/dL — ABNORMAL HIGH (ref 70–99)
Glucose-Capillary: 425 mg/dL — ABNORMAL HIGH (ref 70–99)

## 2022-06-27 LAB — RENAL FUNCTION PANEL
Albumin: 2 g/dL — ABNORMAL LOW (ref 3.5–5.0)
Anion gap: 12 (ref 5–15)
BUN: 35 mg/dL — ABNORMAL HIGH (ref 8–23)
CO2: 24 mmol/L (ref 22–32)
Calcium: 8.2 mg/dL — ABNORMAL LOW (ref 8.9–10.3)
Chloride: 99 mmol/L (ref 98–111)
Creatinine, Ser: 2.18 mg/dL — ABNORMAL HIGH (ref 0.61–1.24)
GFR, Estimated: 31 mL/min — ABNORMAL LOW (ref >60)
Glucose, Bld: 434 mg/dL — ABNORMAL HIGH (ref 70–99)
Phosphorus: 4.6 mg/dL (ref 2.5–4.6)
Potassium: 4.1 mmol/L (ref 3.5–5.1)
Sodium: 135 mmol/L (ref 135–145)

## 2022-06-27 LAB — BLOOD GAS, ARTERIAL
Acid-Base Excess: 0.1 mmol/L (ref 0.0–2.0)
Bicarbonate: 28.2 mmol/L — ABNORMAL HIGH (ref 20.0–28.0)
Drawn by: 164
O2 Saturation: 98.8 %
Patient temperature: 37
pCO2 arterial: 60 mmHg — ABNORMAL HIGH (ref 32–48)
pH, Arterial: 7.28 — ABNORMAL LOW (ref 7.35–7.45)
pO2, Arterial: 96 mmHg (ref 83–108)

## 2022-06-27 LAB — MAGNESIUM: Magnesium: 2 mg/dL (ref 1.7–2.4)

## 2022-06-27 LAB — BLOOD GAS, VENOUS
Acid-base deficit: 0.8 mmol/L (ref 0.0–2.0)
Bicarbonate: 30.3 mmol/L — ABNORMAL HIGH (ref 20.0–28.0)
O2 Saturation: 77.8 %
Patient temperature: 37
pCO2, Ven: 85 mmHg (ref 44–60)
pH, Ven: 7.16 — CL (ref 7.25–7.43)
pO2, Ven: 45 mmHg (ref 32–45)

## 2022-06-27 LAB — CBC
HCT: 27.4 % — ABNORMAL LOW (ref 39.0–52.0)
Hemoglobin: 8.4 g/dL — ABNORMAL LOW (ref 13.0–17.0)
MCH: 30.5 pg (ref 26.0–34.0)
MCHC: 30.7 g/dL (ref 30.0–36.0)
MCV: 99.6 fL (ref 80.0–100.0)
Platelets: 102 10*3/uL — ABNORMAL LOW (ref 150–400)
RBC: 2.75 MIL/uL — ABNORMAL LOW (ref 4.22–5.81)
RDW: 22.2 % — ABNORMAL HIGH (ref 11.5–15.5)
WBC: 11.3 10*3/uL — ABNORMAL HIGH (ref 4.0–10.5)
nRBC: 0.4 % — ABNORMAL HIGH (ref 0.0–0.2)

## 2022-06-27 LAB — AMMONIA: Ammonia: 22 umol/L (ref 9–35)

## 2022-06-27 MED ORDER — VENLAFAXINE HCL 75 MG PO TABS
75.0000 mg | ORAL_TABLET | Freq: Two times a day (BID) | ORAL | Status: DC
  Administered 2022-06-28 – 2022-06-30 (×4): 75 mg
  Filled 2022-06-27 (×7): qty 1

## 2022-06-27 MED ORDER — INSULIN ASPART 100 UNIT/ML IJ SOLN
0.0000 [IU] | INTRAMUSCULAR | Status: DC
  Administered 2022-06-27: 8 [IU] via SUBCUTANEOUS
  Administered 2022-06-28 (×2): 3 [IU] via SUBCUTANEOUS
  Administered 2022-06-28: 2 [IU] via SUBCUTANEOUS
  Administered 2022-06-28 (×2): 3 [IU] via SUBCUTANEOUS
  Administered 2022-06-29: 2 [IU] via SUBCUTANEOUS
  Administered 2022-06-29: 3 [IU] via SUBCUTANEOUS
  Administered 2022-06-29: 2 [IU] via SUBCUTANEOUS
  Administered 2022-06-29: 3 [IU] via SUBCUTANEOUS
  Administered 2022-06-30: 2 [IU] via SUBCUTANEOUS
  Administered 2022-06-30: 5 [IU] via SUBCUTANEOUS
  Administered 2022-06-30: 3 [IU] via SUBCUTANEOUS
  Administered 2022-06-30: 2 [IU] via SUBCUTANEOUS

## 2022-06-27 MED ORDER — LACTULOSE 10 GM/15ML PO SOLN
30.0000 g | Freq: Four times a day (QID) | ORAL | Status: DC
  Administered 2022-06-27: 30 g
  Filled 2022-06-27 (×2): qty 45

## 2022-06-27 MED ORDER — INSULIN ASPART 100 UNIT/ML IJ SOLN
3.0000 [IU] | INTRAMUSCULAR | Status: DC
  Administered 2022-06-27 (×3): 3 [IU] via SUBCUTANEOUS

## 2022-06-27 MED ORDER — MIRTAZAPINE 15 MG PO TABS
15.0000 mg | ORAL_TABLET | Freq: Every day | ORAL | Status: DC
  Administered 2022-06-27 – 2022-06-29 (×3): 15 mg via ORAL
  Filled 2022-06-27 (×4): qty 1

## 2022-06-27 MED ORDER — THIAMINE HCL 100 MG/ML IJ SOLN
500.0000 mg | Freq: Three times a day (TID) | INTRAVENOUS | Status: AC
  Administered 2022-06-27 – 2022-06-30 (×8): 500 mg via INTRAVENOUS
  Filled 2022-06-27 (×9): qty 5

## 2022-06-27 MED ORDER — INSULIN ASPART 100 UNIT/ML IJ SOLN
2.0000 [IU] | INTRAMUSCULAR | Status: DC
  Administered 2022-06-27 (×3): 6 [IU] via SUBCUTANEOUS

## 2022-06-27 MED ORDER — THIAMINE MONONITRATE 100 MG PO TABS: 100.0000 mg | ORAL_TABLET | Freq: Every day | ORAL | Status: DC

## 2022-06-27 MED ORDER — THIAMINE HCL 100 MG/ML IJ SOLN
250.0000 mg | Freq: Every day | INTRAVENOUS | Status: DC
  Filled 2022-06-27 (×2): qty 2.5

## 2022-06-27 MED ORDER — DEXTROSE 10 % IV SOLN: INTRAVENOUS | Status: DC

## 2022-06-27 NOTE — Progress Notes (Signed)
NAME:  Jeffrey [name], MRN:  [id number], DOB:  15-Sep-1945, LOS: 12 ADMISSION DATE:  06/14/2022, CONSULTATION DATE:  06/22/22 REFERRING MD:  Sabino Gasser TRH, CHIEF COMPLAINT:  AMS   History of Present Illness:  77 yo M PMH ESRD on MWF HD, alcoholic cirrhosis, DM2, Chronic hypoxic respiratory failure on 2L, Afib, cirrhosis was admitted 1/15 for acute on chronic hypoxic resp failure, Flu PNA.   On  1/22 he was more lethargic required BiPAP and then intubation for acute respiratory acidosis.  He was had to be extubated on 1/25, moved to the floor 1/26 A bit less responsive on 1/27, ABG revealed recurrent severe respiratory acidosis.  PCCM consulted to evaluate and BiPAP reinitiated.    Pertinent  Medical History   Past Medical History:  Diagnosis Date   A-fib [facility])    when initially stating dialysis january 2021 at [facility] per spouse; never heard anything else about it   Alcoholic cirrhosis [facility])    patient reports completing hep a and b vaccines in 2006   Anemia    has had 3 units of prbcs 2013   Anxiety    Arthritis    Asymptomatic gallstones    Ultrasound in 2006   B12 deficiency 11/21/2020   C. difficile colitis    Cholelithiasis    Chronic kidney disease    Dialysis M/W/F/Sa   Depression    Diabetes ([place])    Duodenal ulcer with hemorrhage    per patient in 2006 or 2007, records have been requested   Dyspnea    low oxygen level -  has O2 2 L all day   Esophageal varices ([place])    see PSH   GERD (gastroesophageal reflux disease)    Heart burn    History of alcohol abuse    quit 05/2013   HOH (hard of hearing)    Hypertension    Liver cirrhosis ([name])    ETOH   SBP (spontaneous bacterial peritonitis) ([place])    2020, November 2021.    Splenomegaly    Ultrasound in 2006    Significant Hospital Events: Including procedures, antibiotic start and stop dates in addition to other pertinent events   1/15 admit to [facility] from [facility] for iHD need 1/15 nephro consult, HD  1/16  Flu A pos, tamiflu  1/18 para, no SBP  1/19 steroids added for incr O2 need  1/22 PCCM consulted for AMS hypercarbic resp failure, SOB. Transfer to ICU for intubation  1/25 Extubated To floor 1/26 Hypercapneic and required BiPAP 1/27  Interim History / Subjective:  BiPAP being restarted now Denies any pain.  Does not look like he has received any sedating medications   Objective   Blood pressure (!) 159/54, pulse 79, temperature 98.5 F (36.9 C), temperature source Axillary, resp. rate (!) 24, height '5\' 7"'$  (1.702 m), weight 68.7 kg, SpO2 96 %.        Intake/Output Summary (Last 24 hours) at 06/27/2022 1648 Last data filed at 06/27/2022 0600 Gross per 24 hour  Intake 890.32 ml  Output 100 ml  Net 790.32 ml   Filed Weights   06/24/22 0500 06/26/22 0500 06/27/22 0458  Weight: 65.7 kg 71.9 kg 68.7 kg    Examination: Physical exam: General: Ill-appearing man, laying in bed in no distress HEENT: BiPAP being started.  No secretions.  No stridor. Neuro: Lethargic but does wake to voice, does follow commands.  No secretions and he is protecting his airway, intact cough Chest: Clear bilaterally, no  wheezing Heart: Regular, 3/6 systolic murmur.  No pretibial edema Abdomen: Nondistended, positive bowel sounds Skin: No rash   Resolved Hospital Problem list   Shock  Assessment & Plan:  Acute/subacute metabolic encephalopathy Multifactorial from uremia, hypercarbia and hepatic encephalopathy.  At this time it appears that hypercapnia is driving his altered mental status -Treat his respiratory acidosis as below -Continue lactulose and rifaximin -HD per nephrology schedule  Acute on chronic respiratory failure with hypoxia and hypercapnia Influenza A PNA Patient uses 2 L oxygen at baseline.  Extubated to 4 L nasal cannula on 1/25.  Hypercapnia 1/27 -BiPAP initiated.  He has adequate airway protection to tolerate.  Will continue this mode for 2 hours and then recheck an ABG.  If he  remains hypercapnic, has any worsening of his mental status then he will need ET intubation -No clear indication for steroids or antibiotics at this time  Acute on chronic deficiency anemia History of GI bleed Patient had 2 EGD in September 2023 and had APC for bleeding gastropathy.  He only has grade 1 varices.  He had a colonoscopy in 12/2018 through which had a possible bleeding polypoid lesion in the splenic flexure. Hemoglobin stable at 8.3.  No EGD per GI. -Remains on PPI twice daily -P.o. iron supplementation  Thrombocytopenia, improved to his usual baseline Inconsistent with DIC HITT antibody panel negative -On SCD for DVT prophylaxis, could consider reinitiation subcutaneous heparin  A-fib with RVR -Amiodarone discontinued -Metoprolol as ordered -Poor anticoagulation candidate given history of GI blood loss  ESRD on HD MWF -Appreciate nephrology management, HD as per their schedule  Decompensated alcoholic cirrhosis with ascites with esophageal varices and encephalopathy Patient had paracentesis done on 1/18, which was negative for SBP -Lactulose, rifaximin -Convert back to Bactrim SBP prophylaxis when he is able to take p.o.  Hypervolemic hyponatremia, improved  DM2 with hyperglycemia -restart his SSI  Best Practice (right click and "Reselect all SmartList Selections" daily)   Diet/type: NPO  DVT prophylaxis: SCD GI prophylaxis: IV PPI Lines:  Foley:  NA Code Status:  DNR, no CPR, would accept intubation, pressors Last date of multidisciplinary goals of care discussion [1/23}  Labs   CBC: Recent Labs  Lab 06/21/22 0403 06/22/22 0333 06/22/22 1808 06/23/22 0818 06/23/22 0926 06/24/22 1640 06/04/2022 0440 06/12/2022 0920 06/26/22 0221 06/27/22 0121  WBC 7.8 5.4   < > 18.8*   < > 6.8 7.4 7.2 13.7* 11.3*  NEUTROABS 7.5 5.1  --  18.2*  --   --   --  6.9  --   --   HGB 8.2* 7.4*   < > 7.0*   < > 8.0* 8.5* 8.5* 8.3* 8.4*  HCT 25.4* 23.8*   < > 21.9*   < >  23.7* 25.8* 25.0* 25.5* 27.4*  MCV 100.0 100.8*   < > 96.9   < > 94.8 94.9 95.4 95.5 99.6  PLT 102* 84*   < > 130*   < > 60* 57* 53* 95* 102*   < > = values in this interval not displayed.    Basic Metabolic Panel: Recent Labs  Lab 06/23/22 0818 06/23/22 0926 06/23/22 2027 06/24/22 0443 06/24/22 1640 06/16/2022 0440 06/26/22 0221 06/27/22 0121  NA 128* 130*  --  131*  --  131* 131* 135  K 4.3 4.5  --  3.5  --  3.5 3.6 4.1  CL 90*  --   --  94*  --  95* 97* 99  CO2 24  --   --  26  --  '26 26 24  '$ GLUCOSE 209*  --   --  245*  --  187* 51* 434*  BUN 114*  --   --  55*  --  28* 43* 35*  CREATININE 4.53*  --   --  2.74*  --  1.79* 2.53* 2.18*  CALCIUM 9.0  --   --  8.5*  --  7.8* 8.1* 8.2*  MG 2.1  --  1.7 1.9 1.5* 2.3  --  2.0  PHOS 2.5  --  1.7* 1.9*  1.8* 1.1* 2.0* 3.7 4.6   GFR: Estimated Creatinine Clearance: 27.4 mL/min (A) (by C-G formula based on SCr of 2.18 mg/dL (H)). Recent Labs  Lab 06/03/2022 0440 06/11/2022 0920 06/26/22 0221 06/27/22 0121  WBC 7.4 7.2 13.7* 11.3*    Liver Function Tests: Recent Labs  Lab 06/23/22 0818 06/24/22 0443 06/22/2022 0440 06/26/22 0221 06/27/22 0121  ALBUMIN 2.0* 2.5* 2.2* 2.1* 2.0*   No results for input(s): "LIPASE", "AMYLASE" in the last 168 hours. Recent Labs  Lab 06/23/22 0818  AMMONIA 91*    ABG    Component Value Date/Time   PHART 7.28 (L) 06/22/2022 1810   PCO2ART 61 (H) 06/22/2022 1810   PO2ART 282 (H) 06/22/2022 1810   HCO3 30.3 (H) 06/27/2022 1553   TCO2 29 06/23/2022 0926   ACIDBASEDEF 0.8 06/27/2022 1553   O2SAT 77.8 06/27/2022 1553     Coagulation Profile: Recent Labs  Lab 06/24/22 0759  INR 1.7*    Cardiac Enzymes: No results for input(s): "CKTOTAL", "CKMB", "CKMBINDEX", "TROPONINI" in the last 168 hours.  HbA1C: Hgb A1c MFr Bld  Date/Time Value Ref Range Status  04/23/2022 02:40 AM 6.1 (H) 4.8 - 5.6 % Final    Comment:    (NOTE)         Prediabetes: 5.7 - 6.4         Diabetes: >6.4          Glycemic control for adults with diabetes: <7.0   07/29/2021 05:22 AM 5.5 4.8 - 5.6 % Final    Comment:    (NOTE) Pre diabetes:          5.7%-6.4%  Diabetes:              >6.4%  Glycemic control for   <7.0% adults with diabetes     CBG: Recent Labs  Lab 06/27/22 0147 06/27/22 0329 06/27/22 0802 06/27/22 1151 06/27/22 1602  GLUCAP 425* 411* 331* 330* 274*    Review of Systems:   Unable to obtain due to encephalopathy   Past Medical History:  He,  has a past medical history of A-fib ([place]), Alcoholic cirrhosis ([place]), Anemia, Anxiety, Arthritis, Asymptomatic gallstones, B12 deficiency (11/21/2020), C. difficile colitis, Cholelithiasis, Chronic kidney disease, Depression, Diabetes ([place]), Duodenal ulcer with hemorrhage, Dyspnea, Esophageal varices ([place]), GERD (gastroesophageal reflux disease), Heart burn, History of alcohol abuse, HOH (hard of hearing), Hypertension, Liver cirrhosis ([place]), SBP (spontaneous bacterial peritonitis) ([place]), and Splenomegaly.   Surgical History:   Past Surgical History:  Procedure Laterality Date   AGILE CAPSULE N/A 09/20/2019   Procedure: AGILE CAPSULE;  Surgeon: Daneil Dolin, MD;  [facility];  Service: Endoscopy;  Laterality: N/A;   APPENDECTOMY     AV FISTULA PLACEMENT Left 08/17/2019   Procedure: ARTERIOVENOUS (AV) FISTULA CREATION VERSES ARTERIOVENOUS GRAFT;  Surgeon: [name], MD;  Location: [facility];  Service: Vascular;  Laterality: Left;   [place] Left 09/28/2019   Procedure: LEFT  SECOND STAGE BASCILIC VEIN TRANSPOSITION;  Surgeon: [name], MD;  Location: [place];  Service: Vascular;  Laterality: Left;   BIOPSY  02/03/2018   Procedure: BIOPSY;  Surgeon: Daneil Dolin, MD;  [facility];  Service: Endoscopy;;  bx of gastric polyps   BIOPSY  09/19/2019   Procedure: BIOPSY;  Surgeon: Danie Binder, MD;  [facility];  Service: Endoscopy;;   BIOPSY  01/16/2021   Procedure:  BIOPSY;  Surgeon: Daneil Dolin, MD;  [facility];  Service: Endoscopy;;   CATARACT EXTRACTION Right    COLONOSCOPY  03/10/2005   Rectal polyp as described above, removed with snare. Left sided  diverticula. The remainder of the colonic mucosa appeared normal. Inflammed polyp on path.   COLONOSCOPY  04/1999   Dr. Thea Silversmith polyps removed,  Path showed hyperplastic   COLONOSCOPY  10/2015   Dr. Britta Mccreedy: diverticulosis, single sessile tubular adenoma 3-89m in size removed from descending colon.    COLONOSCOPY WITH ESOPHAGOGASTRODUODENOSCOPY (EGD)  03/31/2012   RWGN:[id number]. Colonic diverticulosis. tubular adenoma colon   COLONOSCOPY WITH ESOPHAGOGASTRODUODENOSCOPY (EGD)  06/2019   FORSYTH: small esophageal varices without high risk stigmata, single large AVM on lesser curvature in gastric body s/p APC ablation, gastric antral and duodenal bulb polyposis. No significant source to explain transfusion dependent anemia. Colonoscopy with portal colopathy and diffuse edema, changes of Cdiff colitis on colonoscopy   COLONOSCOPY WITH PROPOFOL N/A 01/19/2021   Surgeon: CDaryel November MD; 15 mm polyp in descending colon removed piecemeal, 6 mm polyp in ascending colon and 12 mm polyp in sigmoid colon not removed due to high bleeding risk, benign polypoid lesion at splenic flexure, diverticulosis in sigmoid and descending colon, rectal varices.   COLONOSCOPY WITH PROPOFOL N/A 01/16/2021   Surgeon: RDaneil Dolin MD;  Portal colopathy, large rectal varices, polypoid mass at splenic flexure biopsy, two 1 cm left colon polyps not removed, left-sided diverticulosis.  Reported patient had bleeding from splenic flexure lesion.  Pathology with acutely inflamed colonic mucosa with localized ulceration and prominent reactive changes.   ESOPHAGEAL BANDING N/A 02/03/2018   Procedure: ESOPHAGEAL BANDING;  Surgeon: RDaneil Dolin MD;  [facility];  Service: Endoscopy;  Laterality:  N/A;   ESOPHAGEAL BANDING  01/16/2021   Procedure: ESOPHAGEAL BANDING;  Surgeon: RDaneil Dolin MD;  [facility];  Service: Endoscopy;;   ESOPHAGOGASTRODUODENOSCOPY  01/15/2005   Three columns grade 1 to 2 esophageal varices, otherwise normal esophageal mucosa.  Esophagus was not manipulated otherwise./Nodularity of the antrum with overlying erosions, nonspecific finding. Path showed rare H.pylori   ESOPHAGOGASTRODUODENOSCOPY  09/2008   Dr. RGaylene Brookscolumns of grade 2-3 esoph varices, only one column was prominent. Portal gastropathy, multiple gastrc polyps at antrum, two were 2cm with black eschar, bulbar polyps, bulbar erosions   ESOPHAGOGASTRODUODENOSCOPY  11/2004   Dr. FBrantley Stage3 esoph varices   ESOPHAGOGASTRODUODENOSCOPY  03/31/2012   RMR: 4 columns(3-GR 2, 1-GR1) non-bleeding esophageal varices, portal gastropathy, small HH, early GAVE, multiple gastric polyps    ESOPHAGOGASTRODUODENOSCOPY (EGD) WITH PROPOFOL N/A 02/03/2018   Dr. RGala Romney Esophageal varices, 3 columns grade 1-2.  Portal hypertensive gastropathy.  Multiple gastric polyps, biopsy consistent with hyperplastic.   ESOPHAGOGASTRODUODENOSCOPY (EGD) WITH PROPOFOL N/A 09/19/2019   Fields: grade I and II esophageal varcies, mild portal hypertensive gastropathy, moderate gastritis but no H. pylori, single hyperplastic gastric polyp removed, obvious source for melena/transfusion dependent anemia not identified, may be due to friable gastric  and duodenal mucosa in the setting of portal hypertension   ESOPHAGOGASTRODUODENOSCOPY (EGD) WITH PROPOFOL N/A 01/16/2021   Surgeon: Daneil Dolin, MD; Portal hypertensive gastropathy, grade 2 esophageal varices with bleeding stigmata s/p banding x4, slight nodularity and congestion involving bulb and second portion of duodenum, nonspecific.  Recommended repeat EGD in 4 weeks.   ESOPHAGOGASTRODUODENOSCOPY (EGD) WITH PROPOFOL N/A 05/22/2021   stigmata of prior esophageal  banding and no significant varices seen. Portal gastropathy. Polypoid gastric and duodenal mucosa with overlying erosions, marked mucosal friability.   ESOPHAGOGASTRODUODENOSCOPY (EGD) WITH PROPOFOL N/A 02/06/2022   Procedure: ESOPHAGOGASTRODUODENOSCOPY (EGD) WITH PROPOFOL;  Surgeon: Harvel Quale, MD;  [facility];  Service: Gastroenterology;  Laterality: N/A;   GASTRIC VARICES BANDING  03/31/2012   Procedure: GASTRIC VARICES BANDING;  Surgeon: Daneil Dolin, MD;  [facility];  Service: Endoscopy;  Laterality: N/A;   GIVENS CAPSULE STUDY N/A 09/21/2019   Poor study with a lot of debris obstructing much of the view of the first approximate 3 hours out of 6 hours.  No obvious source of bleeding identified.  Sequela of bleeding and old blood seen in the form of"whisps" of blood, blood flecks mostly toward the end of the study.   HEMOSTASIS CLIP PLACEMENT  01/19/2021   Procedure: HEMOSTASIS CLIP PLACEMENT;  Surgeon: [name], MD;  [facility] END[facility]COPY;  Service: Gastroenterology;;   HEMOSTASIS CLIP PLACEMENT  02/06/2022   Procedure: HEMOSTASIS CLIP PLACEMENT;  Surgeon: Montez Morita, Quillian Quince, MD;  [facility];  Service: Gastroenterology;;  gastric body   HOT HEMOSTASIS  02/06/2022   Procedure: HOT HEMOSTASIS (ARGON PLASMA COAGULATION/BICAP);  Surgeon: Montez Morita, Quillian Quince, MD;  [facility];  Service: Gastroenterology;;   IR PARACENTESIS  06/18/2022   IR REMOVAL TUN CV CATH W/O FL  02/15/2020   POLYPECTOMY  09/19/2019   Procedure: POLYPECTOMY;  Surgeon: Danie Binder, MD;  [facility];  Service: Endoscopy;;   POLYPECTOMY  01/19/2021   Procedure: POLYPECTOMY;  Surgeon: [name], MD;  Location[facility] ENDOSCOPY;  Service: Gastroenterology;;   UMBILICAL HERNIA REPAIR  2017   exploratory laparotomy, abdominal washout     Social History:   reports that he quit smoking about 36 years ago. His smoking use  included cigarettes. He has a 50.00 pack-year smoking history. He has never used smokeless tobacco. He reports that he does not drink alcohol and does not use drugs.   Family History:  His family history includes Breast cancer in his sister; Colon cancer (age of onset: 45) in his father; Diabetes in his sister; Healthy in his daughter and son; Stroke in his mother. There is no history of Lung cancer or Ovarian cancer.   Allergies Allergies  Allergen Reactions   Tape Other (See Comments)    Very thin skin, tears easily     Home Medications  Prior to Admission medications   Medication Sig Start Date End Date Taking? Authorizing Provider  acetaminophen (TYLENOL) 325 MG tablet Take 650 mg by mouth every 6 (six) hours as needed for mild pain, moderate pain, fever or headache.   Yes [provider]  allopurinol (ZYLOPRIM) 300 MG tablet Take 300 mg by mouth in the morning. 12/11/21  Yes [provider]  ALPRAZolam Duanne Moron) 0.5 MG tablet Take 1 tablet (0.5 mg total) by mouth See admin instructions. Take one tablet (0.5 mg) by mouth on Monday, Wednesday, Friday before dialysis, may also take one tablet (0.5 mg) daily  as needed for anxiety. 02/07/22  Yes Tat, Shanon Brow, MD  ASCORBIC ACID PO Take 1 tablet by mouth in the morning. Vitamin C, unknown strength.   Yes [provider]  Calcium Carbonate Antacid (TUMS PO) Take 2 tablets by mouth with breakfast, with lunch, and with evening meal.   Yes [provider]  cetirizine (ZYRTEC) 10 MG tablet Take 10 mg by mouth in the morning. 06/24/21  Yes [provider]  Cholecalciferol (VITAMIN D-3 PO) Take 1 capsule by mouth in the morning.   Yes [provider]  diltiazem (DILACOR XR) 120 MG 24 hr capsule Take 120 mg by mouth in the morning. 05/19/22  Yes [provider]  famotidine (PEPCID) 20 MG tablet Take 1 tablet (20 mg total) by mouth 2 (two) times daily. 07/30/21 07/30/22 Yes Barton Dubois, MD   furosemide (LASIX) 40 MG tablet Take 80 mg by mouth in the morning. 12/29/19  Yes [provider]  HUMALOG KWIKPEN 100 UNIT/ML KwikPen Inject 10-50 Units into the skin See admin instructions. Inject 10-50 units three times daily with meals per sliding scale. 04/23/21  Yes [provider]  HYDROcodone-acetaminophen (NORCO/VICODIN) 5-325 MG tablet Take 1 tablet by mouth daily as needed for severe pain.   Yes [provider]  hydrOXYzine (ATARAX/VISTARIL) 25 MG tablet Take 25 mg by mouth 3 (three) times daily. 11/23/20  Yes [provider]  iron polysaccharides (NIFEREX) 150 MG capsule Take 150 mg by mouth at bedtime.   Yes [provider]  Lactobacillus (ACIDOPHILUS PO) Take 1 capsule by mouth in the morning.   Yes [provider]  lactulose (CHRONULAC) 10 GM/15ML solution Take 15 mLs (10 g total) by mouth 2 (two) times daily. Patient taking differently: Take 10 g by mouth See admin instructions. 10g twice daily on Sunday, Tuesday, Thursday, Saturday 10g in the evening on Monday, Wednesday, Friday after dialysis. 02/07/22  Yes Tat, Shanon Brow, MD  metoprolol succinate (TOPROL-XL) 25 MG 24 hr tablet Take 1 tablet (25 mg total) by mouth daily. Patient taking differently: Take 25 mg by mouth See admin instructions. 25 mg twice daily on Sunday, Tuesday, Thursday, Saturday. 25 mg at bedtime on Monday, Wednesday, Friday (does not take before dialysis) 02/22/22  Yes Tat, Shanon Brow, MD  mirtazapine (REMERON) 30 MG tablet Take 1 tablet (30 mg total) by mouth at bedtime. 02/10/20  Yes Roxan Hockey, MD  Multiple Vitamin (MULTIVITAMIN WITH MINERALS) TABS tablet Take 1 tablet by mouth in the morning.    Yes [provider]  nitroGLYCERIN (NITROGLYN) 2 % ointment Apply 2 inches topically once as needed for chest pain.   Yes [provider]  omega-3 acid ethyl esters (LOVAZA) 1 g capsule Take 2 g by mouth 2 (two) times daily.  04/07/19  Yes [provider]  ondansetron (ZOFRAN) 4 MG tablet Take 4 mg by mouth every 8 (eight) hours as needed for nausea or vomiting.   Yes [provider]  pantoprazole (PROTONIX) 40 MG tablet Take 1 tablet (40 mg total) by mouth 2 (two) times daily. 02/21/22  Yes Tat, Shanon Brow, MD  pravastatin (PRAVACHOL) 20 MG tablet Take 20 mg by mouth in the morning.    Yes [provider]  rifaximin (XIFAXAN) 550 MG TABS tablet Take 1 tablet (550 mg total) by mouth 2 (two) times daily. 05/02/22  Yes Emokpae, Courage, MD  SSD 1 % cream Apply 2 Applications topically 2 (two) times daily as needed (bed sores). 02/26/22  Yes [provider]  sulfamethoxazole-trimethoprim (BACTRIM DS) 800-160 MG tablet TAKE 1 TABLET BY MOUTH every friday AFTER dialysis Patient taking differently: Take 1 tablet by mouth See admin instructions. 1 tablet every Friday, after dialysis 03/12/22  Yes Annitta Needs, NP  thiamine (VITAMIN B-1) 100 MG tablet Take 100 mg by mouth in the morning. 08/27/20  Yes [provider]  tiZANidine (ZANAFLEX) 2 MG tablet Take 2 mg by mouth at bedtime as needed for muscle spasms. 04/07/22  Yes [provider]  TRESIBA FLEXTOUCH 200 UNIT/ML FlexTouch Pen Inject 10-50 Units into the skin See admin instructions. Inject 10-50 units once daily at bedtime as needed for high blood sugar levels, per sliding scale. 02/17/22  Yes [provider]  triamcinolone cream (KENALOG) 0.1 % Apply 1 application  topically 2 (two) times daily as needed (dry skin, itching). 12/30/17  Yes [provider]  venlafaxine (EFFEXOR) 75 MG tablet Take 75 mg by mouth 3 (three) times daily with meals.    Yes [provider]  zinc sulfate 220 (50 Zn) MG capsule Take 1 capsule (220 mg total) by mouth daily. Patient taking differently: Take 220 mg by mouth in the morning. 05/03/22  Yes Roxan Hockey, MD     Critical care time: 40 minutes     Baltazar Apo, MD, PhD 06/27/2022, 5:20  PM [facility] [phone] or if no answer before 7:00PM call [phone] For any issues after 7:00PM please call eLink [phone]

## 2022-06-27 NOTE — Progress Notes (Signed)
CBG at 2400H = 421 mg/dl, 6 units Novlog subcutenous given per sliding scale. Rechecked CBG at 0200H= 425. Notified PCCM via [place].

## 2022-06-27 NOTE — Progress Notes (Signed)
[place] Progress Note Patient Name: Jeffrey [name] DOB: [birth year] MRN: [id number]   Date of Service  06/27/2022  HPI/Events of Note  Patient with sub-optimal glycemic control.  eICU Interventions  Tube feed coverage + correction dose insulin ordered.        Kerry Kass Ammie Warrick 06/27/2022, 2:21 AM

## 2022-06-27 NOTE — Progress Notes (Addendum)
PROGRESS NOTE    Jeffrey [name]  GQQ:[id number] DOB: [birth year] DOA: 06/09/2022 PCP: Monico Blitz, MD  Chief Complaint  Patient presents with   Respiratory Distress    Brief Narrative:  77 yo M PMH ESRD on MWF HD, alcoholic cirrhosis, DM2, Chronic hypoxic respiratory failure on 2L, Afib, cirrhosis was admitted 1/15 for acute on chronic hypoxic resp failure, Flu PNA.   On  1/22 he was more lethargic. While in iHD he was started on BiPAP but remained lethargic.  ABG with resp acidosis    Significant events 1/15 admit to [place] from [facility] for iHD need 1/15 nephro consult, HD  1/16 Flu Annetta Deiss pos, tamiflu  1/18 para, no SBP  1/19 steroids added for incr O2 need  1/22 PCCM consulted for AMS hypercarbic resp failure, SOB. Transfer to ICU for intubation  1/27 TRH assumed care   Assessment & Plan:   Principal Problem:   Acute on chronic respiratory failure with hypoxia (HCC) Active Problems:   Influenza Matisha Termine   Acute hepatic encephalopathy (HCC)   ESRD on dialysis ([place])   Alcoholic cirrhosis of liver with ascites ([place])   Esophageal varices in alcoholic cirrhosis ([place])   Acute hyponatremia   Controlled type 2 diabetes mellitus with chronic kidney disease on chronic dialysis, with long-term current use of insulin (HCC)   Protein-calorie malnutrition, severe  Addendum Hypercarbic respiratory failure based on VBG - will start bipap and ask PCCM to reevaluate.  Acute metabolic encephalopathy  Hepatic Encephalopathy Multifactorial.  Suspect hepatic encephalopathy is the driving cause.  Hypercarbia, uremia could contribute. Lactulose, rifaximin Minimize sedating meds - taper remeron and effexor as well to avoid withdrawal (discussed with pharmacy).  Remove prn xanax from med list. High dose thiamine Follow repeat ammonia levels and VBG Dialysis per renal    Dysphagia With NG tube for meds/tube feeds Appreciate SLP/RD  Acute on chronic respiratory failure with hypoxia and hypercapnia Influenza  Griselle Rufer PNA -now extubated on 4 L [place] -completed tamiflu  -No indication for antibiotic or steroid -repeat VBG as above   Acute on chronic deficiency anemia History of GI bleed Patient had 2 EGD in September 2023 and had APC for bleeding gastropathy.  He only has grade 1 varices.  He had Demetrios Byron colonoscopy in 12/2018 through which had Mayline Dragon possible bleeding polypoid lesion in the splenic flexure. Hb down to 5.6 on 1/24, improved to 8 after 2 units pRBC Relatively stable since around 8.4 GI was consulted 1/25, they're planning to defer EGD if no evidence active GI bleed, recent endoscopic eval within 6 months. -Continue PPI 40 mg twice daily -iron def and chronic disease    Shock of multifactorial causes-resolved -Discontinue midodrine   Thrombocytopenia His baseline platelets around 100s. 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  He has other explanation for thrombocytopenia including cirrhosis, ESRD and acute illness. Platelet trending upward to 95.  Peripheral blood smear unremarkable, no schistocytes to suspect DIC. -Pending HIT antibody results -> wnl -SCD for DVT proph   Bandon Sherwin-fib with RVR -Amiodarone 100 mg daily -He is not on anticoagulation at home.  Not Ferrah Panagopoulos candidate for DOAC given his ESRD.  Warfarin may be his only choice.  His HAS-BLED score is 5 indicates high risk of bleeding. This will be Osby Sweetin risk vs benefit conversation with family.  Will hold anticoagulation at this time given his encephalopathy.   ESRD on HD MWF -Nephrology is following   Decompensated alcoholic cirrhosis with ascites with esophageal varices and encephalopathy Patient had paracentesis  done on 1/18, which was negative for SBP Continue lactulose, rifaximin, inderol   Bactrim for SBP ppx    Hypervolemic hyponatremia Monitor electrolytes   DM2 BG's elevated - stop D10 and watch BG's Will adjust insulin regimen as able     DVT prophylaxis: SCD Code Status: DNR -> ok with intubation, mechanical ventilation, cardioversion   Family Communication: wife Disposition:   Status is: Inpatient Remains inpatient appropriate because: continued need for inpatient care   Consultants:  PCCM Renal  GI  Procedures:  none  Antimicrobials:  Anti-infectives (From admission, onward)    Start     Dose/Rate Route Frequency Ordered Stop   06/26/22 2200  sulfamethoxazole-trimethoprim (BACTRIM DS) 800-160 MG per tablet 1 tablet  Status:  Discontinued        1 tablet Oral Weekly 06/26/22 1022 06/26/22 1024   06/26/22 2200  sulfamethoxazole-trimethoprim (BACTRIM DS) 800-160 MG per tablet 1 tablet        1 tablet Per Tube Weekly 06/26/22 1024     06/26/22 1600  sulfamethoxazole-trimethoprim (BACTRIM DS) 800-160 MG per tablet 1 tablet  Status:  Discontinued       Note to Pharmacy: TAKE 1 TABLET BY MOUTH every friday AFTER dialysis     1 tablet Per Tube Weekly 06/22/22 1828 06/24/22 1533   06/26/22 1015  rifaximin (XIFAXAN) tablet 550 mg        550 mg Per Tube 2 times daily 06/26/22 1013     06/26/22 1000  cefTRIAXone (ROCEPHIN) 1 g in sodium chloride 0.9 % 100 mL IVPB  Status:  Discontinued        1 g 200 mL/hr over 30 Minutes Intravenous Every 24 hours 06/24/22 1533 06/26/22 1022   06/02/2022 2200  rifaximin (XIFAXAN) tablet 550 mg  Status:  Discontinued        550 mg Oral 2 times daily 06/14/2022 1344 06/26/22 1013   06/22/22 2200  rifaximin (XIFAXAN) tablet 550 mg  Status:  Discontinued        550 mg Per Tube 2 times daily 06/22/22 1828 06/18/2022 1344   06/19/22 1600  sulfamethoxazole-trimethoprim (BACTRIM DS) 800-160 MG per tablet 1 tablet  Status:  Discontinued       Note to Pharmacy: TAKE 1 TABLET BY MOUTH every friday AFTER dialysis     1 tablet Oral Weekly 06/10/2022 1534 06/22/22 1828   06/17/22 1800  oseltamivir (TAMIFLU) capsule 30 mg        30 mg Oral Every M-W-F-Sa (1800) 06/17/22 1327 06/19/22 1718   06/05/2022 1845  oseltamivir (TAMIFLU) capsule 30 mg  Status:  Discontinued        30 mg Oral Daily 06/24/2022 1826  06/17/22 1327   06/21/2022 1545  rifaximin (XIFAXAN) tablet 550 mg  Status:  Discontinued        550 mg Oral 2 times daily 06/08/2022 1534 06/22/22 1828       Subjective: Lethargic, limited information on interview  Objective: Vitals:   06/27/22 0458 06/27/22 0803 06/27/22 0826 06/27/22 1248  BP:  (!) 142/46  (!) 151/51  Pulse:  88 87 81  Resp:  16  20  Temp:  98.7 F (37.1 C)  98.5 F (36.9 C)  TempSrc:  Axillary  Axillary  SpO2:  100%  100%  Weight: 68.7 kg     Height:        Intake/Output Summary (Last 24 hours) at 06/27/2022 1434 Last data filed at 06/27/2022 0600 Gross per 24 hour  Intake 890.32 ml  Output 100 ml  Net 790.32 ml   Filed Weights   06/24/22 0500 06/26/22 0500 06/27/22 0458  Weight: 65.7 kg 71.9 kg 68.7 kg    Examination:  General exam: lethargic, no signs of distress Respiratory system: unlabored Cardiovascular system: RRR Gastrointestinal system: Abdomen is nondistended, soft and nontender Central nervous system: lethargic, shakes head and mouths words to some questions, but inconsistently follows commands - moves all extremities Extremities: no LEE   Data Reviewed: I have personally reviewed following labs and imaging studies  CBC: Recent Labs  Lab 06/21/22 0403 06/22/22 0333 06/22/22 1808 06/23/22 0818 06/23/22 0926 06/24/22 1640 06/06/2022 0440 06/14/2022 0920 06/26/22 0221 06/27/22 0121  WBC 7.8 5.4   < > 18.8*   < > 6.8 7.4 7.2 13.7* 11.3*  NEUTROABS 7.5 5.1  --  18.2*  --   --   --  6.9  --   --   HGB 8.2* 7.4*   < > 7.0*   < > 8.0* 8.5* 8.5* 8.3* 8.4*  HCT 25.4* 23.8*   < > 21.9*   < > 23.7* 25.8* 25.0* 25.5* 27.4*  MCV 100.0 100.8*   < > 96.9   < > 94.8 94.9 95.4 95.5 99.6  PLT 102* 84*   < > 130*   < > 60* 57* 53* 95* 102*   < > = values in this interval not displayed.    Basic Metabolic Panel: Recent Labs  Lab 06/23/22 0818 06/23/22 0926 06/23/22 2027 06/24/22 0443 06/24/22 1640 06/24/2022 0440 06/26/22 0221  06/27/22 0121  NA 128* 130*  --  131*  --  131* 131* 135  K 4.3 4.5  --  3.5  --  3.5 3.6 4.1  CL 90*  --   --  94*  --  95* 97* 99  CO2 24  --   --  26  --  '26 26 24  '$ GLUCOSE 209*  --   --  245*  --  187* 51* 434*  BUN 114*  --   --  55*  --  28* 43* 35*  CREATININE 4.53*  --   --  2.74*  --  1.79* 2.53* 2.18*  CALCIUM 9.0  --   --  8.5*  --  7.8* 8.1* 8.2*  MG 2.1  --  1.7 1.9 1.5* 2.3  --  2.0  PHOS 2.5  --  1.7* 1.9*  1.8* 1.1* 2.0* 3.7 4.6    GFR: Estimated Creatinine Clearance: 27.4 mL/min (Rommel Hogston) (by C-G formula based on SCr of 2.18 mg/dL (H)).  Liver Function Tests: Recent Labs  Lab 06/23/22 0818 06/24/22 0443 06/28/2022 0440 06/26/22 0221 06/27/22 0121  ALBUMIN 2.0* 2.5* 2.2* 2.1* 2.0*    CBG: Recent Labs  Lab 06/27/22 0039 06/27/22 0147 06/27/22 0329 06/27/22 0802 06/27/22 1151  GLUCAP 421* 425* 411* 331* 330*     Recent Results (from the past 240 hour(s))  Body fluid culture w Gram Stain     Status: None   Collection Time: 06/18/22  1:02 PM   Specimen: Abdomen; Peritoneal Fluid  Result Value Ref Range Status   Specimen Description ABDOMEN PERITONEAL  Final   Special Requests NONE  Final   Gram Stain NO WBC SEEN NO ORGANISMS SEEN   Final   Culture   Final    NO GROWTH 3 DAYS Performed at [facility], [address], [place], [place] [address]    Report Status 06/22/2022 FINAL  Final  Radiology Studies: DG Abd Portable 1V  Result Date: 06/26/2022 CLINICAL DATA:  [id number] Encounter for feeding tube placement [id number] EXAM: PORTABLE ABDOMEN - 1 VIEW COMPARISON:  Radiograph 06/22/2022 FINDINGS: Feeding tube tip overlies the distal stomach/proximal duodenum. Bibasilar airspace disease and small effusions. Partially visualized right neck catheter tip overlying the right atrium. IMPRESSION: Feeding tube tip overlies the distal stomach/proximal duodenum. Electronically Signed   By: Maurine Simmering M.D.   On: 06/26/2022 09:54        Scheduled  Meds:  allopurinol  100 mg Per Tube Q M,W,F-2000   Chlorhexidine Gluconate Cloth  6 each Topical Q0600   [START ON Jul 25, 2022] darbepoetin (ARANESP) injection - DIALYSIS  200 mcg Subcutaneous Q Tue-1800   feeding supplement (PROSource TF20)  60 mL Per Tube Daily   insulin aspart  2-6 Units Subcutaneous Q4H   insulin aspart  3 Units Subcutaneous Q4H   iron polysaccharides  150 mg Per Tube Daily   lactulose  30 g Per Tube TID   metoprolol tartrate  12.5 mg Per Tube BID   mirtazapine  30 mg Per Tube QHS   multivitamin  1 tablet Per Tube QHS   mouth rinse  15 mL Mouth Rinse Q2H   pantoprazole  40 mg Intravenous Q12H   pravastatin  20 mg Per Tube q AM   rifaximin  550 mg Per Tube BID   sulfamethoxazole-trimethoprim  1 tablet Per Tube Weekly   thiamine  100 mg Per Tube Daily   venlafaxine  75 mg Per Tube TID WC   Continuous Infusions:  sodium chloride Stopped (06/23/22 0834)   anticoagulant sodium citrate     dextrose     feeding supplement (VITAL 1.5 CAL) 60 mL/hr at 06/27/22 0600     LOS: 12 days    Time spent: over 30 min    Fayrene Helper, MD [facility]   To contact the attending provider between 7A-7P or the covering provider during after hours 7P-7A, please log into the web site [email or web address] and access using universal [place] password for that web site. If you do not have the password, please call the hospital operator.  06/27/2022, 2:34 PM

## 2022-06-27 NOTE — Progress Notes (Signed)
[facility] Progress Note   Subjective:  had HD yesterday w/ 300 cc UF. BP's stable today, on 4 L [place]   Objective Vitals:   06/27/22 0332 06/27/22 0458 06/27/22 0803 06/27/22 0826  BP: (!) 152/44  (!) 142/46   Pulse: 94  88 87  Resp: (!) 23  16   Temp: 98.8 F (37.1 C)  98.7 F (37.1 C)   TempSrc: Oral  Axillary   SpO2: 99%  100%   Weight:  68.7 kg    Height:       Physical Exam General: chronically ill appearing HEENT: orally intubated  Heart: RRR  Lungs: BS decreased,  Abdomen: soft, NT Extremities: no LE edema, 1+ UE edema Dialysis Access: LU AVF +b/t   Dialysis Orders: [facility] MWF   4h  80.5kg  350/500  2/2.5 bath  AVF  Hep none  Mircera 200 (due 1/15)      CXR 1/29 -> showed CHF (vs other, viral pneumonitis eg)   CXR 1/22 x 2, 1/23, 1/25 - all showed resolution of CHF-like pattern Assessment/Plan: Dyspnea/respiratory distress -secondary to volume overload/Influenza A +--initially requiring BiPAP on admit, then improved, then decompensated again and required intubation on 1/22, now extubated. CXR changes from admit (CHF and/or viral pneumonitis) have resolved.  Encephalopathy - hepatic / metabolic cause, lactulose/rifaximin per primary.  ESRD -  HD MWF. HD yesterday. Next HD Monday.  BP - shock/ hypotension resolved, BP's good now Afib /RVR - on amio, no a/c w/ possible GI bleed Possible GI bleed - sp octreotide, PPI gtt, changed to PPI bid now Volume - wt's erratic, 8kg - 14kg under dry wt. Euvolemic on exam, mild UE edema. Low albumin contributing to 3rd spacing.  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  Metabolic bone disease -  Ca and phos in goal. No VDRA as outpatient  Nutrition - Renal diet with fluid restriction.  Hx of cirrhosis with ascites - per primary team, sp paracentesis with 2.3L 1/19 H/o etoh abuse Flu A + - completed course of Tamiflu  DNR   Kelly Splinter, MD 06/27/2022, 10:15 AM  Recent Labs  Lab 06/26/22 0221  06/27/22 0121  HGB 8.3* 8.4*  ALBUMIN 2.1* 2.0*  CALCIUM 8.1* 8.2*  PHOS 3.7 4.6  CREATININE 2.53* 2.18*  K 3.6 4.1     Inpatient medications:  allopurinol  100 mg Per Tube Q M,W,F-2000   Chlorhexidine Gluconate Cloth  6 each Topical Q0600   [START ON 07-18-2022] darbepoetin (ARANESP) injection - DIALYSIS  200 mcg Subcutaneous Q Tue-1800   feeding supplement (PROSource TF20)  60 mL Per Tube Daily   insulin aspart  2-6 Units Subcutaneous Q4H   insulin aspart  3 Units Subcutaneous Q4H   iron polysaccharides  150 mg Per Tube Daily   lactulose  30 g Per Tube TID   metoprolol tartrate  12.5 mg Per Tube BID   mirtazapine  30 mg Per Tube QHS   multivitamin  1 tablet Per Tube QHS   mouth rinse  15 mL Mouth Rinse Q2H   pantoprazole  40 mg Intravenous Q12H   pravastatin  20 mg Per Tube q AM   rifaximin  550 mg Per Tube BID   sulfamethoxazole-trimethoprim  1 tablet Per Tube Weekly   thiamine  100 mg Per Tube Daily   venlafaxine  75 mg Per Tube TID WC    sodium chloride Stopped (06/23/22 0834)   anticoagulant sodium citrate  dextrose     feeding supplement (VITAL 1.5 CAL) 60 mL/hr at 06/27/22 0600   acetaminophen **OR** acetaminophen, albuterol, ALPRAZolam, alteplase, alum & mag hydroxide-simeth, anticoagulant sodium citrate, guaiFENesin-dextromethorphan, lidocaine (PF), lidocaine-prilocaine, metoprolol tartrate, ondansetron **OR** ondansetron (ZOFRAN) IV, mouth rinse, pentafluoroprop-tetrafluoroeth, polyethylene glycol, sodium chloride

## 2022-06-27 NOTE — Plan of Care (Signed)
  Problem: Clinical Measurements: Goal: Ability to maintain clinical measurements within normal limits will improve Outcome: Progressing   Problem: Clinical Measurements: Goal: Diagnostic test results will improve Outcome: Progressing   Problem: Clinical Measurements: Goal: Respiratory complications will improve Outcome: Progressing   Problem: Clinical Measurements: Goal: Cardiovascular complication will be avoided Outcome: Progressing   Problem: Nutrition: Goal: Adequate nutrition will be maintained Outcome: Progressing   Problem: Pain Managment: Goal: General experience of comfort will improve Outcome: Progressing   Problem: Safety: Goal: Ability to remain free from injury will improve Outcome: Progressing   Problem: Skin Integrity: Goal: Risk for impaired skin integrity will decrease Outcome: Progressing

## 2022-06-27 NOTE — Progress Notes (Signed)
HR = 130-160's, Afib RVR on monitor, stable BP, denied chest pain, no SOB, alert and oriented x 3, forgetful. Informed PCCM. Due metoprolol given via NGT.   06/26/22 2051  Vitals  Temp 98.1 F (36.7 C)  Temp Source Oral  BP 128/65  MAP (mmHg) 80  BP Location Right Arm  BP Method Automatic  Patient Position (if appropriate) Lying  Pulse Rate (!) 150  Pulse Rate Source Monitor  ECG Heart Rate (!) 148  Resp (!) 23  Level of Consciousness  Level of Consciousness Alert  MEWS COLOR  MEWS Score Color Red  Oxygen Therapy  SpO2 100 %  O2 Device Nasal Cannula  O2 Flow Rate (L/min) 4 L/min  ECG Monitoring  QRS interval 0.14  QT interval 0.29  CV Strip Heart Rate 136  Cardiac Rhythm Atrial fibrillation  MEWS Score  MEWS Temp 0  MEWS Systolic 0  MEWS Pulse 3  MEWS RR 1  MEWS LOC 0  MEWS Score 4

## 2022-06-27 NOTE — Progress Notes (Signed)
Converted to SR with occasional PVC (90 bpm). EKG done. Plan of care ongoing.

## 2022-06-28 DIAGNOSIS — J9621 Acute and chronic respiratory failure with hypoxia: Secondary | ICD-10-CM | POA: Diagnosis not present

## 2022-06-28 LAB — BLOOD GAS, ARTERIAL
Acid-Base Excess: 0.1 mmol/L (ref 0.0–2.0)
Bicarbonate: 27.6 mmol/L (ref 20.0–28.0)
Drawn by: 51155
O2 Saturation: 94.9 %
Patient temperature: 36.6
pCO2 arterial: 55 mmHg — ABNORMAL HIGH (ref 32–48)
pH, Arterial: 7.31 — ABNORMAL LOW (ref 7.35–7.45)
pO2, Arterial: 67 mmHg — ABNORMAL LOW (ref 83–108)

## 2022-06-28 LAB — CBC
HCT: 26.8 % — ABNORMAL LOW (ref 39.0–52.0)
Hemoglobin: 8.5 g/dL — ABNORMAL LOW (ref 13.0–17.0)
MCH: 30.9 pg (ref 26.0–34.0)
MCHC: 31.7 g/dL (ref 30.0–36.0)
MCV: 97.5 fL (ref 80.0–100.0)
Platelets: 106 10*3/uL — ABNORMAL LOW (ref 150–400)
RBC: 2.75 MIL/uL — ABNORMAL LOW (ref 4.22–5.81)
RDW: 21.5 % — ABNORMAL HIGH (ref 11.5–15.5)
WBC: 14.2 10*3/uL — ABNORMAL HIGH (ref 4.0–10.5)
nRBC: 0.2 % (ref 0.0–0.2)

## 2022-06-28 LAB — HEPATIC FUNCTION PANEL
ALT: 63 U/L — ABNORMAL HIGH (ref 0–44)
AST: 81 U/L — ABNORMAL HIGH (ref 15–41)
Albumin: 2 g/dL — ABNORMAL LOW (ref 3.5–5.0)
Alkaline Phosphatase: 416 U/L — ABNORMAL HIGH (ref 38–126)
Bilirubin, Direct: 0.4 mg/dL — ABNORMAL HIGH (ref 0.0–0.2)
Indirect Bilirubin: 0.3 mg/dL (ref 0.3–0.9)
Total Bilirubin: 0.7 mg/dL (ref 0.3–1.2)
Total Protein: 5.3 g/dL — ABNORMAL LOW (ref 6.5–8.1)

## 2022-06-28 LAB — BLOOD GAS, VENOUS
Acid-Base Excess: 1.5 mmol/L (ref 0.0–2.0)
Bicarbonate: 30.5 mmol/L — ABNORMAL HIGH (ref 20.0–28.0)
Drawn by: 64037
O2 Saturation: 69 %
Patient temperature: 36.8
pCO2, Ven: 68 mmHg — ABNORMAL HIGH (ref 44–60)
pH, Ven: 7.26 (ref 7.25–7.43)
pO2, Ven: 44 mmHg (ref 32–45)

## 2022-06-28 LAB — MAGNESIUM: Magnesium: 2.1 mg/dL (ref 1.7–2.4)

## 2022-06-28 LAB — RENAL FUNCTION PANEL
Albumin: 2 g/dL — ABNORMAL LOW (ref 3.5–5.0)
Anion gap: 13 (ref 5–15)
BUN: 51 mg/dL — ABNORMAL HIGH (ref 8–23)
CO2: 22 mmol/L (ref 22–32)
Calcium: 8.9 mg/dL (ref 8.9–10.3)
Chloride: 101 mmol/L (ref 98–111)
Creatinine, Ser: 2.92 mg/dL — ABNORMAL HIGH (ref 0.61–1.24)
GFR, Estimated: 22 mL/min — ABNORMAL LOW (ref >60)
Glucose, Bld: 172 mg/dL — ABNORMAL HIGH (ref 70–99)
Phosphorus: 4.6 mg/dL (ref 2.5–4.6)
Potassium: 4.3 mmol/L (ref 3.5–5.1)
Sodium: 136 mmol/L (ref 135–145)

## 2022-06-28 LAB — GLUCOSE, CAPILLARY
Glucose-Capillary: 122 mg/dL — ABNORMAL HIGH (ref 70–99)
Glucose-Capillary: 153 mg/dL — ABNORMAL HIGH (ref 70–99)
Glucose-Capillary: 175 mg/dL — ABNORMAL HIGH (ref 70–99)
Glucose-Capillary: 180 mg/dL — ABNORMAL HIGH (ref 70–99)
Glucose-Capillary: 200 mg/dL — ABNORMAL HIGH (ref 70–99)
Glucose-Capillary: 87 mg/dL (ref 70–99)

## 2022-06-28 MED ORDER — LACTULOSE 10 GM/15ML PO SOLN
30.0000 g | Freq: Three times a day (TID) | ORAL | Status: DC
  Administered 2022-06-28 – 2022-06-30 (×6): 30 g
  Filled 2022-06-28 (×8): qty 45

## 2022-06-28 MED ORDER — CHLORHEXIDINE GLUCONATE CLOTH 2 % EX PADS: 6.0000 | MEDICATED_PAD | Freq: Every day | CUTANEOUS | Status: DC

## 2022-06-28 MED ORDER — HEPARIN SODIUM (PORCINE) 5000 UNIT/ML IJ SOLN: 5000.0000 [IU] | Freq: Three times a day (TID) | INTRAMUSCULAR | Status: DC

## 2022-06-28 MED ORDER — ORAL CARE MOUTH RINSE: 15.0000 mL | OROMUCOSAL | Status: DC | PRN

## 2022-06-28 NOTE — Progress Notes (Signed)
[facility] Progress Note   Subjective:  had HD yesterday w/ 300 cc UF. BP's stable today, on 4 L [place]   Objective Vitals:   06/28/22 0420 06/28/22 0733 06/28/22 1108 06/28/22 1624  BP: 123/60 (!) 120/51 (!) 106/46 (!) 106/48  Pulse: 91  95   Resp: (!) 26     Temp: 97.9 F (36.6 C) 97.6 F (36.4 C)  98.5 F (36.9 C)  TempSrc: Axillary Oral  Axillary  SpO2: 95%   97%  Weight:      Height:       Physical Exam General: chronically ill appearing HEENT: orally intubated  Heart: RRR  Lungs: BS decreased,  Abdomen: soft, NT Extremities: no LE edema, 1+ UE edema Dialysis Access: LU AVF +b/t   Dialysis Orders: [facility] MWF   4h  80.5kg  350/500  2/2.5 bath  AVF  Hep none  Mircera 200 (due 1/15)      CXR 1/29 -> showed CHF (vs other, viral pneumonitis eg)   CXR 1/22 x 2, 1/23, 1/25 - all showed resolution of CHF-like pattern Assessment/Plan: Dyspnea/respiratory distress -secondary to volume overload/Influenza A +--initially requiring BiPAP on admit, then improved, then decompensated again and required intubation on 1/22, now extubated. CXR changes from admit (CHF and/or viral pneumonitis) have resolved.  Encephalopathy - hepatic / metabolic cause, lactulose/rifaximin per primary.  ESRD -  HD MWF. Last HD here was Friday. Next HD Monday.  BP - shock/ hypotension resolved, BP's stable Afib /RVR - on amio, no a/c w/ possible GI bleed Possible GI bleed - sp octreotide, PPI gtt, changed to PPI bid now Volume - wt's erratic, 8kg - 14kg under dry wt. Euvolemic on exam, mild UE edema. Low albumin contributing to 3rd spacing.  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  Metabolic bone disease -  Ca and phos in goal. No VDRA as outpatient  Nutrition - Renal diet with fluid restriction.  Hx of cirrhosis with ascites - per primary team, sp paracentesis with 2.3L 1/19 H/o etoh abuse Flu A + - completed course of Tamiflu  DNR   Kelly Splinter, MD 06/28/2022, 4:55  PM  Recent Labs  Lab 06/27/22 0121 06/28/22 0146 06/28/22 0149  HGB 8.4* 8.5*  --   ALBUMIN 2.0* 2.0* 2.0*  CALCIUM 8.2*  --  8.9  PHOS 4.6  --  4.6  CREATININE 2.18*  --  2.92*  K 4.1  --  4.3     Inpatient medications:  allopurinol  100 mg Per Tube Q M,W,F-2000   [START ON Jul 09, 2022] darbepoetin (ARANESP) injection - DIALYSIS  200 mcg Subcutaneous Q Tue-1800   feeding supplement (PROSource TF20)  60 mL Per Tube Daily   insulin aspart  0-15 Units Subcutaneous Q4H   iron polysaccharides  150 mg Per Tube Daily   lactulose  30 g Per Tube TID   metoprolol tartrate  12.5 mg Per Tube BID   mirtazapine  15 mg Oral QHS   multivitamin  1 tablet Per Tube QHS   mouth rinse  15 mL Mouth Rinse Q2H   pantoprazole  40 mg Intravenous Q12H   pravastatin  20 mg Per Tube q AM   rifaximin  550 mg Per Tube BID   sulfamethoxazole-trimethoprim  1 tablet Per Tube Weekly   [START ON 07/05/2022] thiamine  100 mg Oral Daily   venlafaxine  75 mg Per Tube BID WC    sodium chloride Stopped (06/23/22 0834)  anticoagulant sodium citrate     feeding supplement (VITAL 1.5 CAL) 60 mL/hr at 06/27/22 0600   thiamine (VITAMIN B1) injection 500 mg (06/28/22 9276)   Followed by   Derrill Memo ON Jul 12, 2022] thiamine (VITAMIN B1) injection     acetaminophen **OR** acetaminophen, albuterol, alteplase, alum & mag hydroxide-simeth, anticoagulant sodium citrate, guaiFENesin-dextromethorphan, lidocaine (PF), lidocaine-prilocaine, ondansetron **OR** ondansetron (ZOFRAN) IV, mouth rinse, pentafluoroprop-tetrafluoroeth, polyethylene glycol, sodium chloride

## 2022-06-28 NOTE — Progress Notes (Signed)
Patient refusing bipap around 23:30. Removed and replaced 4L O2 via nasal cannula.

## 2022-06-28 NOTE — Progress Notes (Signed)
Inpatient Rehab Admissions:  Inpatient Rehab Consult received.  I met with patient and wife at the bedside for rehabilitation assessment and to discuss goals and expectations of an inpatient rehab admission.  Discussed average length of stay, insurance authorization requirement, and discharge home after completion of CIR. Wife acknowledged understanding. Wife is supportive of pt pursuing CIR. Wife confirmed that she and family will be able to provide 24/7 support for pt after discharge. Will continue to follow.  Signed: Gayland Curry, [place], [place] Admissions Coordinator [phone]

## 2022-06-28 NOTE — Progress Notes (Signed)
NAME:  Jeffrey [name], MRN:  [id number], DOB:  [birth year], LOS: 73 ADMISSION DATE:  06/03/2022, CONSULTATION DATE:  06/22/22 REFERRING MD:  Sabino Gasser TRH, CHIEF COMPLAINT:  AMS   History of Present Illness:  77 yo M PMH ESRD on MWF HD, alcoholic cirrhosis, DM2, Chronic hypoxic respiratory failure on 2L, Afib, cirrhosis was admitted 1/15 for acute on chronic hypoxic resp failure, Flu PNA.   On  1/22 he was more lethargic required BiPAP and then intubation for acute respiratory acidosis.  He was had to be extubated on 1/25, moved to the floor 1/26 A bit less responsive on 1/27, ABG revealed recurrent severe respiratory acidosis.  PCCM consulted to evaluate and BiPAP reinitiated.    Pertinent  Medical History   Past Medical History:  Diagnosis Date   A-fib [facility])    when initially stating dialysis january 2021 at [facility] per spouse; never heard anything else about it   Alcoholic cirrhosis [facility])    patient reports completing hep a and b vaccines in 2006   Anemia    has had 3 units of prbcs 2013   Anxiety    Arthritis    Asymptomatic gallstones    Ultrasound in 2006   B12 deficiency 11/21/2020   C. difficile colitis    Cholelithiasis    Chronic kidney disease    Dialysis M/W/F/Sa   Depression    Diabetes ([place])    Duodenal ulcer with hemorrhage    per patient in 2006 or 2007, records have been requested   Dyspnea    low oxygen level -  has O2 2 L all day   Esophageal varices ([place])    see [facility]   GERD (gastroesophageal reflux disease)    Heart burn    History of alcohol abuse    quit 05/2013   HOH (hard of hearing)    Hypertension    Liver cirrhosis ([place])    ETOH   SBP (spontaneous bacterial peritonitis) ([place])    2020, November 2021.    Splenomegaly    Ultrasound in 2006    Significant Hospital Events: Including procedures, antibiotic start and stop dates in addition to other pertinent events   1/15 admit to [facility] from [facility] for iHD need 1/15 nephro consult, HD  1/16  Flu A pos, tamiflu  1/18 para, no SBP  1/19 steroids added for incr O2 need  1/22 PCCM consulted for AMS hypercarbic resp failure, SOB. Transfer to ICU for intubation  1/25 Extubated To floor 1/26 Hypercapneic and required BiPAP 1/27  Interim History / Subjective:   Patient placed on BiPAP 1/27 for hypercapnic respiratory failure.  As he started to wake up he took the mask off around 12:32 AM, it was replaced that he was able to wear it successfully overnight for the rest of the night. Significant improvement in his wakefulness and mental status pCO2 55 < 85   Objective   Blood pressure (!) 106/46, pulse 95, temperature 97.6 F (36.4 C), temperature source Oral, resp. rate (!) 26, height '5\' 7"'$  (1.702 m), weight 68.7 kg, SpO2 95 %.    Vent Mode: BIPAP FiO2 (%):  [40 %-50 %] 50 % Set Rate:  [10 bmp] 10 bmp PEEP:  [5 cmH20] 5 cmH20 Pressure Support:  [15 cmH20] 15 cmH20  No intake or output data in the 24 hours ending 06/28/22 1149  Filed Weights   06/24/22 0500 06/26/22 0500 06/27/22 0458  Weight: 65.7 kg 71.9 kg 68.7 kg    Examination:  Physical exam: General: Ill-appearing man, in bed, no distress HEENT: Off BiPAP, oropharynx somewhat dry, NG tube in place Neuro: He is awake, interacting.  Follows commands.  Attempting to communicate but somewhat difficult to understand. Chest: Clear bilaterally without wheezing Heart: Regular, 3/6 systolic murmur, no edema Abdomen: Nondistended, positive bowel sounds Skin: No rash   Resolved Hospital Problem list   Shock  Assessment & Plan:  Acute/subacute metabolic encephalopathy Multifactorial from uremia, hypercarbia and hepatic encephalopathy.  Driven 3/66 principally by his hypercapnia.  Improved on 1/28 -At risk for recurrent respiratory acidosis, address as below -Continue his lactulose and rifaximin HD as per nephrology schedule  Acute on chronic respiratory failure with hypoxia and hypercapnia Influenza A PNA Patient  uses 2 L oxygen at baseline.  Extubated to 4 L nasal cannula on 1/25.  Hypercapnia 1/27, significantly improved after BiPAP -Believe that he will benefit from scheduled nocturnal BiPAP given his multiple medical problems, risk for respiratory suppression and chronic hypercapnic respiratory failure.  Discussed this with him today and he is willing to try wearing it reliably. -No indication for antibiotics or steroids at this time  Acute on chronic deficiency anemia History of GI bleed Patient had 2 EGD in September 2023 and had APC for bleeding gastropathy.  He only has grade 1 varices.  He had a colonoscopy in 12/2018 through which had a possible bleeding polypoid lesion in the splenic flexure. Hemoglobin stable at 8.3.  No EGD per GI. 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 -On SCD for DVT prophylaxis, could consider reinitiation subcutaneous heparin  A-fib with RVR -Metoprolol as ordered -Poor anticoagulation candidate  ESRD on HD MWF -Appreciate nephrology management, HD as per their schedule  Decompensated alcoholic cirrhosis with ascites with esophageal varices and encephalopathy Patient had paracentesis done on 1/18, which was negative for SBP -Lactulose, rifaximin -Convert back to Bactrim SBP prophylaxis when he is able to tolerate  DM2 with hyperglycemia -Sliding scale insulin  Best Practice (right click and "Reselect all SmartList Selections" daily)   Diet/type: NPO  DVT prophylaxis: SCD GI prophylaxis: IV PPI Lines:  Foley:  NA Code Status:  DNR, no CPR, would accept intubation, pressors Last date of multidisciplinary goals of care discussion [1/23}  Labs   CBC: Recent Labs  Lab 06/22/22 0333 06/22/22 1808 06/23/22 0818 06/23/22 0926 06/05/2022 0440 06/14/2022 0920 06/26/22 0221 06/27/22 0121 06/28/22 0146  WBC 5.4   < > 18.8*   < > 7.4 7.2 13.7*  11.3* 14.2*  NEUTROABS 5.1  --  18.2*  --   --  6.9  --   --   --   HGB 7.4*   < > 7.0*   < > 8.5* 8.5* 8.3* 8.4* 8.5*  HCT 23.8*   < > 21.9*   < > 25.8* 25.0* 25.5* 27.4* 26.8*  MCV 100.8*   < > 96.9   < > 94.9 95.4 95.5 99.6 97.5  PLT 84*   < > 130*   < > 57* 53* 95* 102* 106*   < > = values in this interval not displayed.    Basic Metabolic Panel: Recent Labs  Lab 06/24/22 0443 06/24/22 1640 06/12/2022 0440 06/26/22 0221 06/27/22 0121 06/28/22 0146 06/28/22 0149  NA 131*  --  131* 131* 135  --  136  K 3.5  --  3.5 3.6 4.1  --  4.3  CL 94*  --  95* 97* 99  --  101  CO2 26  --  '26 26 24  '$ --  22  GLUCOSE 245*  --  187* 51* 434*  --  172*  BUN 55*  --  28* 43* 35*  --  51*  CREATININE 2.74*  --  1.79* 2.53* 2.18*  --  2.92*  CALCIUM 8.5*  --  7.8* 8.1* 8.2*  --  8.9  MG 1.9 1.5* 2.3  --  2.0 2.1  --   PHOS 1.9*  1.8* 1.1* 2.0* 3.7 4.6  --  4.6   GFR: Estimated Creatinine Clearance: 20.4 mL/min (A) (by C-G formula based on SCr of 2.92 mg/dL (H)). Recent Labs  Lab 06/07/2022 0920 06/26/22 0221 06/27/22 0121 06/28/22 0146  WBC 7.2 13.7* 11.3* 14.2*    Liver Function Tests: Recent Labs  Lab 06/14/2022 0440 06/26/22 0221 06/27/22 0121 06/28/22 0146 06/28/22 0149  AST  --   --   --  81*  --   ALT  --   --   --  63*  --   ALKPHOS  --   --   --  416*  --   BILITOT  --   --   --  0.7  --   PROT  --   --   --  5.3*  --   ALBUMIN 2.2* 2.1* 2.0* 2.0* 2.0*   No results for input(s): "LIPASE", "AMYLASE" in the last 168 hours. Recent Labs  Lab 06/23/22 0818 06/27/22 1815  AMMONIA 91* 22    ABG    Component Value Date/Time   PHART 7.31 (L) 06/28/2022 0602   PCO2ART 55 (H) 06/28/2022 0602   PO2ART 67 (L) 06/28/2022 0602   HCO3 27.6 06/28/2022 0602   TCO2 29 06/23/2022 0926   ACIDBASEDEF 0.8 06/27/2022 1553   O2SAT 94.9 06/28/2022 0602     Coagulation Profile: Recent Labs  Lab 06/24/22 0759  INR 1.7*    Cardiac Enzymes: No results for input(s): "CKTOTAL",  "CKMB", "CKMBINDEX", "TROPONINI" in the last 168 hours.  HbA1C: Hgb A1c MFr Bld  Date/Time Value Ref Range Status  04/23/2022 02:40 AM 6.1 (H) 4.8 - 5.6 % Final    Comment:    (NOTE)         Prediabetes: 5.7 - 6.4         Diabetes: >6.4         Glycemic control for adults with diabetes: <7.0   07/29/2021 05:22 AM 5.5 4.8 - 5.6 % Final    Comment:    (NOTE) Pre diabetes:          5.7%-6.4%  Diabetes:              >6.4%  Glycemic control for   <7.0% adults with diabetes     CBG: Recent Labs  Lab 06/27/22 1602 06/27/22 2001 06/28/22 0044 06/28/22 0418 06/28/22 0744  GLUCAP 274* 264* 175* 122* 87     Critical care time: NA     Baltazar Apo, MD, PhD 06/28/2022, 11:49 AM [facility] [phone] or if no answer before 7:00PM call [phone] For any issues after 7:00PM please call eLink [phone]

## 2022-06-28 NOTE — Progress Notes (Signed)
TRH night cross cover note:   I was contacted by patient's RN requesting clarification on whether we will be pursuing BiPAP or tube feeds this evening, noting that the patient has been off of his tube feeds for more than 24 hours in order to allow management via BiPAP in the setting of hypercapnia.  Per my chart review, including review of most recent rounding hospitalist progress note, it appears that the current plan is to continue nightly BiPAP.  I then updated the patient's RN that the plan is to continue BiPAP this evening while holding tube feeds.       Babs Bertin, DO Hospitalist

## 2022-06-28 NOTE — PMR Pre-admission (Shared)
PMR Admission Coordinator Pre-Admission Assessment  Patient: Jeffrey [name] is an 77 y.o., male MRN: [id number] DOB: [birth year] Height: '5\' 7"'$  (170.2 cm) Weight: 68.7 kg  Insurance Information HMO: ***    PPO: ***     PCP:      IPA:      80/20:      OTHER:  PRIMARY: BCBS Medicare      Policy#: [id number]      Subscriber: patient CM Name: ***      Phone#: ***     Fax#: *** Pre-Cert#: ***      Employer: *** Benefits:  Phone #: ***     Name: *** Eff. Date: ***     Deduct: ***      Out of Pocket Max: ***      Life Max: *** CIR: ***      SNF: *** Outpatient: ***     Co-Pay: *** Home Health: ***      Co-Pay: *** DME: ***     Co-Pay: *** Providers: in-network SECONDARY:       Policy#:      Phone#:   Financial Counselor:       Phone#:   The Engineer, petroleum" for patients in Inpatient Rehabilitation Facilities with attached "Privacy Act [place] Records" was provided and verbally reviewed with: {CHL IP Patient Family YT:[id number]}  Emergency Contact Information Contact Information     Name Relation Home Work Mobile   Perella,[name] Spouse   [phone]   Lorie Phenix [phone]  [phone]       Current Medical History  Patient Admitting Diagnosis: debility s/p acute on chronic respiratory failure with hypoxia History of Present Illness: Pt is a 77 year old male with medical hx significant 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  Pt initially presented to [facility] on 1/14 for scheduled paracentesis. Procedure unable to be performed d/t pt complaining of SOB and inability to lie flat. Pt transferred to [facility] on 06/23/2022 d/t acute influenza viral respiratory failure in setting of end-stage renal disease and pulmonary edema. Pt was weaned off BiPAP to 5-6L nasal cannula prior to arriving to [facility]. Pt underwent paracentesis  on 1/18. Yielded 2.3L of fluid; culture  negative. Pt appeared to have hepatic encephalopathy on 1/19. Chest x-ray on 1/19 showed interstitial edema. Pt had respiratory distress on 1/22 so placed on NRB then BiPAP. PCCM consulted for intubation. Pt intubated by Dr. Tacy Learn on 1/22. Pt had hemoglobin of 5.6 on 1/24; transfused 2 units PRBC. GI consulted and recommended EGD. However, d/t improvement of hemoglobin to 8.5, EGD deferred. Pt extubated to 4L nasal cannula on 06/23/2022. Pt hypercapnic; BiPAP reinitiated on 1/27. Therapy evaluations completed and CIR recommended d/t pt's deficits in functional mobility, dysphagia,  and inability to complete ADLs independently.    Patient's medical record from [facility] has been reviewed by the rehabilitation admission coordinator and physician.  Past Medical History  Past Medical History:  Diagnosis Date   A-fib [facility])    when initially stating dialysis january 2021 at [facility] per spouse; never heard anything else about it   Alcoholic cirrhosis [facility])    patient reports completing hep a and b vaccines in 2006   Anemia    has had 3 units of prbcs 2013   Anxiety    Arthritis    Asymptomatic gallstones    Ultrasound in 2006   B12 deficiency  11/21/2020   C. 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    Splenomegaly    Ultrasound in 2006    Has the patient had major surgery during 100 days prior to admission? No  Family History   family history includes Breast cancer in  his sister; Colon cancer (age of onset: 31) in his father; Diabetes in his sister; Healthy in his daughter and son; Stroke in his mother.  Current Medications  Current Facility-Administered Medications:    0.9 %  sodium chloride infusion, 250 mL, Intravenous, Continuous, Margaretha Seeds, MD, Held at 06/23/22 [phone]   acetaminophen (TYLENOL) tablet 650 mg, 650 mg, Per Tube, Q6H PRN **OR** acetaminophen (TYLENOL) suppository 650 mg, 650 mg, Rectal, D4Y PRN, Jeneen Rinks, RPH   albuterol (PROVENTIL) (2.5 MG/3ML) 0.083% nebulizer solution 2.5 mg, 2.5 mg, Nebulization, Q2H PRN, Dwyane Dee, MD, 2.5 mg at 06/27/22 0431   allopurinol (ZYLOPRIM) tablet 100 mg, 100 mg, Per Tube, Q C,X,K-4818, Jeneen Rinks, RPH, 100 mg at 06/26/22 2123   alteplase (CATHFLO ACTIVASE) injection 2 mg, 2 mg, Intracatheter, Once PRN, Penninger, Ria Comment, PA   alum & mag hydroxide-simeth (MAALOX/MYLANTA) 200-200-20 MG/5ML suspension 15 mL, 15 mL, Per Tube, H6D PRN, Jeneen Rinks, RPH   anticoagulant sodium citrate solution 5 mL, 5 mL, Intracatheter, PRN, Penninger, Ria Comment, PA   [START ON 06-Jul-2022] Darbepoetin Alfa (ARANESP) injection 200 mcg, 200 mcg, Subcutaneous, Q Tue-1800, Roney Jaffe, MD   feeding supplement (PROSource TF20) liquid 60 mL, 60 mL, Per Tube, Daily, Gaylan Gerold, DO, 60 mL at 06/28/22 1108   feeding supplement (VITAL 1.5 CAL) liquid 1,000 mL, 1,000 mL, Per Tube, Continuous, Byrum, Rose Fillers, MD, Last Rate: 60 mL/hr at 06/27/22 0600, Infusion Verify at 06/27/22 0600   guaiFENesin-dextromethorphan (ROBITUSSIN DM) 100-10 MG/5ML syrup 5 mL, 5 mL, Per Tube, J4H PRN, Jeneen Rinks, RPH   insulin aspart (novoLOG) injection 0-15 Units, 0-15 Units, Subcutaneous, Q4H, Byrum, Rose Fillers, MD, 2 Units at 06/28/22 0437   iron polysaccharides (NIFEREX) capsule 150 mg, 150 mg, Per Tube, Daily, Jeneen Rinks, RPH, 150 mg at 06/28/22 1107   lactulose (CHRONULAC) 10 GM/15ML solution 30 g, 30 g,  Per Tube, TID, Elodia Florence., MD, 30 g at 06/28/22 1108   lidocaine (PF) (XYLOCAINE) 1 % injection 5 mL, 5 mL, Intradermal, PRN, Penninger, Ria Comment, PA   lidocaine-prilocaine (EMLA) cream 1 Application, 1 Application, Topical, PRN, Penninger, Ria Comment, PA   metoprolol tartrate (LOPRESSOR) tablet 12.5 mg, 12.5 mg, Per Tube, BID, Gaylan Gerold, DO, 12.5 mg at 06/28/22 1108   mirtazapine (REMERON) tablet 15 mg, 15 mg, Oral, QHS, Elodia Florence., MD, 15 mg at 06/27/22 2129   multivitamin (RENA-VIT) tablet 1 tablet, 1 tablet, Per Tube, QHS, Jeneen Rinks, RPH, 1 tablet at 06/27/22 2129  ondansetron (ZOFRAN) tablet 4 mg, 4 mg, Per Tube, Q6H PRN **OR** ondansetron (ZOFRAN) injection 4 mg, 4 mg, Intravenous, Q6H PRN, Jacky Kindle, MD   Oral care mouth rinse, 15 mL, Mouth Rinse, Q2H, McQuaid, Douglas B, MD, 15 mL at 06/28/22 0057   Oral care mouth rinse, 15 mL, Mouth Rinse, PRN, Simonne Maffucci B, MD   pantoprazole (PROTONIX) injection 40 mg, 40 mg, Intravenous, Q12H, Gaylan Gerold, DO, 40 mg at 06/28/22 1101   pentafluoroprop-tetrafluoroeth (GEBAUERS) aerosol 1 Application, 1 Application, Topical, PRN, Penninger, Ria Comment, PA   polyethylene glycol (MIRALAX / GLYCOLAX) packet 17 g, 17 g, Per Tube, Daily PRN, Jeneen Rinks, RPH   pravastatin (PRAVACHOL) tablet 20 mg, 20 mg, Per Tube, q AM, Mikolichek, Kaleigh, RPH, 20 mg at 06/28/22 1540   rifaximin (XIFAXAN) tablet 550 mg, 550 mg, Per Tube, BID, Jeneen Rinks, RPH, 550 mg at 06/28/22 1107   sodium chloride (OCEAN) 0.65 % nasal spray 1 spray, 1 spray, Each Nare, PRN, Dwyane Dee, MD, 1 spray at 06/16/22 2231   sulfamethoxazole-trimethoprim (BACTRIM DS) 800-160 MG per tablet 1 tablet, 1 tablet, Per Tube, Weekly, Gaylan Gerold, DO, 1 tablet at 06/26/22 2123   thiamine (VITAMIN B1) 500 mg in normal saline (50 mL) IVPB, 500 mg, Intravenous, Q8H, Last Rate: 100 mL/hr at 06/28/22 0867, 500 mg at 06/28/22 0621 **FOLLOWED BY** [START  ON 07/03/2022] thiamine (VITAMIN B1) 250 mg in sodium chloride 0.9 % 50 mL IVPB, 250 mg, Intravenous, Daily **FOLLOWED BY** [START ON 07/05/2022] thiamine (VITAMIN B1) tablet 100 mg, 100 mg, Oral, Daily, Elodia Florence., MD   venlafaxine [facility]) tablet 75 mg, 75 mg, Per Tube, BID WC, Elodia Florence., MD, 75 mg at 06/28/22 1107  Patients Current Diet:  Diet Order     None       Precautions / Restrictions Precautions Precautions: Fall Precaution Comments: ETT, vent, flexiseal, cortrak, bil wrist restraints and mittens Restrictions Weight Bearing Restrictions: No   Has the patient had 2 or more falls or a fall with injury in the past year? Yes  Prior Activity Level Community (5-7x/wk): goes to dialysis and doctor's appointments  Prior Functional Level Self Care: Did the patient need help bathing, dressing, using the toilet or eating? Needed some help  Indoor Mobility: Did the patient need assistance with walking from room to room (with or without device)? Independent  Stairs: Did the patient need assistance with internal or external stairs (with or without device)? Pt uses ramp and wheelchair in community  Functional Cognition: Did the patient need help planning regular tasks such as shopping or remembering to take medications? Needed some help  Patient Information Are you of Hispanic, Latino/a,or Spanish origin?: A. No, not of Hispanic, Latino/a, or Spanish origin What is your race?: A. White Do you need or want an interpreter to communicate with a doctor or health care staff?: 0. No  Patient's Response To:  Health Literacy and Transportation Is the patient able to respond to health literacy and transportation needs?: Yes Health Literacy - How often do you need to have someone help you when you read instructions, pamphlets, or other written material from your doctor or pharmacy?: Never In the past 12 months, has lack of transportation kept you from medical  appointments or from getting medications?: No In the past 12 months, has lack of transportation kept you from meetings, work, or from getting things needed for daily living?: No  Development worker, international aid / [place] Devices/Equipment: None Home  Equipment: Conservation officer, nature (2 wheels), Rollator (4 wheels), [employer] - single point, [employer] - built in, [employer] - tub/shower, BSC/3in1, Wheelchair - manual  Prior Device Use: Indicate devices/aids used by the patient prior to current illness, exacerbation or injury? Manual wheelchair and Walker  Current Functional Level Cognition  Overall Cognitive Status: Difficult to assess Difficult to assess due to: Intubated, Hard of hearing/deaf Current Attention Level: Focused Orientation Level: Oriented to person, Oriented to place, Oriented to time, Disoriented to situation Following Commands: Follows one step commands inconsistently Safety/Judgement: Decreased awareness of safety, Decreased awareness of deficits General Comments: able to follow one step commands    Extremity Assessment (includes Sensation/Coordination)  Upper Extremity Assessment: LUE deficits/detail, RUE deficits/detail RUE Deficits / Details: Baseline shoulder deficits due to shoulder dislocation per patient report. Unable to demonstrate more than ~5-10 degrees active shoulder flexion. Very limited with shoulder A/ROM. P/ROM shoulder in all ranges WFL. MMT: elbow flexion/extension: 4/5. weak gross grasp LUE Deficits / Details: A/ROM WFL in all ranges. MMT: shoulder and elbow ranges: 4-/5. Decreased gross grasp.  Lower Extremity Assessment: Defer to PT evaluation    ADLs  Overall ADL's : Needs assistance/impaired Eating/Feeding: Set up, Bed level Grooming: Wash/dry hands, Wash/dry face, Set up, Sitting, Cueing for sequencing Upper Body Bathing: Set up, Sitting Lower Body Bathing: Total assistance, Sit to/from stand Lower Body Bathing Details (indicate cue type and  reason): cleaned bottom while standing from EOB Upper Body Dressing : Set up, Sitting Lower Body Dressing: Total assistance, Bed level Lower Body Dressing Details (indicate cue type and reason): to donn socks Toilet Transfer: Moderate assistance, Stand-pivot, Rolling walker (2 wheels) Toilet Transfer Details (indicate cue type and reason): simulated Toileting- Clothing Manipulation and Hygiene: Total assistance, Sit to/from stand, Cueing for safety, Cueing for sequencing General ADL Comments: focused on bed mobiltiy and standing from EOB    Mobility  Overal bed mobility: Needs Assistance Bed Mobility: Supine to Sit Supine to sit: HOB elevated, Mod assist Sit to supine: Mod assist General bed mobility comments: HOB 30 degrees with mod assist to pivot to EOB and elevate trunk. Min assist +2 to return to supine to clear legs and manage lines    Transfers  Overall transfer level: Needs assistance Equipment used: Rolling walker (2 wheels) Transfers: Sit to/from Stand Sit to Stand: Mod assist, +2 physical assistance, +2 safety/equipment Bed to/from chair/wheelchair/BSC transfer type:: Stand pivot Stand pivot transfers: Mod assist Squat pivot transfers: Max assist Step pivot transfers: Supervision General transfer comment: stood x5 with mod assist of 2 with pad changed and cleaned bottom on separate stands while attempting to get close to [employer] / Gait / Stairs / Wheelchair Mobility  Ambulation/Gait Ambulation/Gait assistance: Min guard, Min assist Gait Distance (Feet): 14 Feet (x2 bouts of ~14 ft each bout) Assistive device: Rolling walker (2 wheels) Gait Pattern/deviations: Step-to pattern, Decreased stride length, Decreased dorsiflexion - left, Decreased dorsiflexion - right, Shuffle, Wide base of support, Trunk flexed General Gait Details: not yet ready Gait velocity: reduced Gait velocity interpretation: <1.31 ft/sec, indicative of household ambulator    Posture /  Balance Dynamic Sitting Balance Sitting balance - Comments: static sitting with guarding for safety Balance Overall balance assessment: Needs assistance Sitting-balance support: No upper extremity supported, Feet supported Sitting balance-Leahy Scale: Fair Sitting balance - Comments: static sitting with guarding for safety Standing balance support: Bilateral upper extremity supported, During functional activity Standing balance-Leahy Scale: Poor Standing balance comment: bil UE support and knees  blocked in standing    Special needs/care consideration Continuous Drip IV  0.9% sodium chloride infusion, Dialysis: Hemodialysis Monday, Wednesday, and Friday, Oxygen 4L O2 via nasal cannula, Skin Abrasion: arm/right, left; Ecchymosis: arm/right, left; Erythema/Redness: arm, hand, leg/bilateral; Skin tear: hand/posterior, right; Skin tear: elbow/left, lower, posterior, proximal; MASD: buttocks/right, left, medial; Pressure injury stage 3: ankle/posterior, right, Diabetic Management: Novolog 0-15 units every 4 hours and Special service needs Droplet and Contact precautions; Fecal management system; Bowel incontinence; NG tube   Previous Home Environment (from acute therapy documentation) Living Arrangements: Spouse/significant other  Lives With: Significant other, Family Available Help at Discharge: Family, Available 24 hours/day Type of Home: House Home Layout: Able to live on main level with bedroom/bathroom Home Access: Ramped entrance Bathroom Shower/Tub: Multimedia programmer: Handicapped height Bathroom Accessibility: No Home Care Services: Yes Type of [place]: Home OT, Home PT, [place] (if known): [employer]  Discharge Living Setting Plans for Discharge Living Setting: Patient's home Type of Home at Discharge: House Discharge Home Layout: Able to live on main level with bedroom/bathroom Discharge Home Access: Ramped entrance Discharge Bathroom Shower/Tub:  Walk-in shower Discharge Bathroom Toilet: Handicapped height Discharge Bathroom Accessibility: No Does the patient have any problems obtaining your medications?: No  Social/Family/Support Systems Anticipated Caregiver: Hendry Speas (wife) and other family Anticipated Caregiver's Contact Information: [phone] Caregiver Availability: 24/7 Discharge Plan Discussed with Primary Caregiver: Yes Is Caregiver In Agreement with Plan?: Yes Does Caregiver/Family have Issues with Lodging/Transportation while Pt is in Rehab?: No  Goals Patient/Family Goal for Rehab: *** Expected length of stay: *** Pt/Family Agrees to Admission and willing to participate: Yes Program Orientation Provided & Reviewed with Pt/Caregiver Including Roles  & Responsibilities: Yes  Decrease burden of Care through IP rehab admission: NA  Possible need for SNF placement upon discharge: Not anticipated  Patient Condition: I have reviewed medical records from [facility], spoken with {CHL IP CSW DX:[id number]}, and patient and spouse. I met with patient at the bedside for inpatient rehabilitation assessment.  Patient will benefit from ongoing PT, OT, and SLP, can actively participate in 3 hours of therapy a day 5 days of the week, and can make measurable gains during the admission.  Patient will also benefit from the coordinated team approach during an Inpatient Acute Rehabilitation admission.  The patient will receive intensive therapy as well as Rehabilitation physician, nursing, social worker, and care management interventions.  Due to bowel management, safety, skin/wound care, disease management, medication administration, pain management, and patient education the patient requires 24 hour a day rehabilitation nursing.  The patient is currently *** with mobility and basic ADLs.  Discharge setting and therapy post discharge at home with home health is anticipated.  Patient has agreed to participate in the Acute Inpatient  Rehabilitation Program and will admit {Time; today/tomorrow:10263}.  Preadmission Screen Completed By:  Bethel Born, 06/28/2022 12:24 PM ______________________________________________________________________   Discussed status with Dr. [name] on *** at *** and received approval for admission today.  Admission Coordinator:  Bethel Born, CCC-SLP, time ***/Date ***   Assessment/Plan: Diagnosis: Does the need for close, 24 hr/day Medical supervision in concert with the patient's rehab needs make it unreasonable for this patient to be served in a less intensive setting? {yes_no_potentially:3041433} Co-Morbidities requiring supervision/potential complications: *** Due to {due ZJ:6734193}, does the patient require 24 hr/day rehab nursing? {yes_no_potentially:3041433} Does the patient require coordinated care of a physician, rehab nurse, PT, OT, and SLP to address  physical and functional deficits in the context of the above medical diagnosis(es)? {yes_no_potentially:3041433} Addressing deficits in the following areas: {deficits:3041436} Can the patient actively participate in an intensive therapy program of at least 3 hrs of therapy 5 days a week? {yes_no_potentially:3041433} The potential for patient to make measurable gains while on inpatient rehab is {potential:3041437} Anticipated functional outcomes upon discharge from inpatient rehab: {functional outcomes:304600100} PT, {functional outcomes:304600100} OT, {functional outcomes:304600100} SLP Estimated rehab length of stay to reach the above functional goals is: *** Anticipated discharge destination: {anticipated dc setting:21604} 10. Overall Rehab/Functional Prognosis: {potential:3041437}   MD Signature: ***

## 2022-06-28 NOTE — Progress Notes (Signed)
PROGRESS NOTE    Jeffrey [name]  JWJ:[id number] DOB: 1946/01/01 DOA: 06/17/2022 PCP: Monico Blitz, MD  Chief Complaint  Patient presents with   Respiratory Distress    Brief Narrative:  77 yo M PMH ESRD on MWF HD, alcoholic cirrhosis, DM2, Chronic hypoxic respiratory failure on 2L, Afib, cirrhosis was admitted 1/15 for acute on chronic hypoxic resp failure, Flu PNA.   On  1/22 he was more lethargic. While in iHD he was started on BiPAP but remained lethargic.  ABG with resp acidosis    Significant events 1/15 admit to [facility] from [facility] for iHD need 1/15 nephro consult, HD  1/16 Flu Jeffrey [name] pos, tamiflu  1/18 para, no SBP  1/19 steroids added for incr O2 need  1/22 PCCM consulted for AMS hypercarbic resp failure, SOB. Transfer to ICU for intubation  1/27 TRH assumed care   Assessment & Plan:   Principal Problem:   Acute on chronic respiratory failure with hypoxia (HCC) Active Problems:   Influenza Jeffrey [name]   Acute hepatic encephalopathy (HCC)   ESRD on dialysis ([name])   Alcoholic cirrhosis of liver with ascites ([name])   Esophageal varices in alcoholic cirrhosis ([name])   Acute hyponatremia   Controlled type 2 diabetes mellitus with chronic kidney disease on chronic dialysis, with long-term current use of insulin (HCC)   Protein-calorie malnutrition, severe   Acute metabolic encephalopathy  Hepatic Encephalopathy Multifactorial.  This was driven by hypercarbia yesterday.  Hepatic encephalopathy, uremia other contributing causes.   Nightly bipap Lactulose, rifaximin Minimize sedating meds - taper remeron and effexor as well to avoid withdrawal (discussed with pharmacy).  Remove prn xanax from med list. High dose thiamine Follow repeat ammonia levels (normalized)  pCO2 improving on VBG this AM, but still acidemia Dialysis per renal    Dysphagia With NG tube for meds/tube feeds Appreciate SLP/RD  Acute on chronic respiratory failure with hypoxia and hypercapnia Influenza Jeffrey [name] PNA -now  extubated on 4 L [place] -completed tamiflu  -No indication for antibiotic or steroid -needs nightly bipap   Acute on chronic deficiency anemia History of GI bleed Patient had 2 EGD in September 2023 and had APC for bleeding gastropathy.  He only has grade 1 varices.  He had Jeffrey [name] colonoscopy in 12/2018 through which had Jeffrey [name] possible bleeding polypoid lesion in the splenic flexure. Hb down to 5.6 on 1/24, improved to 8 after 2 units pRBC Relatively stable since around 8.4 GI was consulted 1/25, they're planning to defer EGD if no evidence active GI bleed, recent endoscopic eval within 6 months. -Continue PPI 40 mg twice daily -iron def and chronic disease    Shock of multifactorial causes-resolved -Discontinue midodrine   Thrombocytopenia His baseline platelets around 100s. 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  He has other explanation for thrombocytopenia including cirrhosis, ESRD and acute illness. Platelet trending upward to 106.  Peripheral blood smear unremarkable, no schistocytes to suspect DIC. -Pending HIT antibody results -> wnl -SCD for DVT proph.  Will hold off of heparin for now with concern for bleeding above as well as thrombocytopenia.  Will consider resumption of subcutaneous heparin if remains stable.   Jeffrey [name]-fib with RVR -Amiodarone 100 mg daily -He is not on anticoagulation at home. Could consider eliquis.  Per critical care note, his HAS-BLED score is 5 indicates high risk of bleeding. This will be Jeffrey [name] risk vs benefit conversation with family.  Will hold anticoagulation at this time given his encephalopathy.   ESRD on HD MWF -Nephrology is following  Decompensated alcoholic cirrhosis with ascites with esophageal varices and encephalopathy Patient had paracentesis done on 1/18, which was negative for SBP Continue lactulose, rifaximin, inderol   Bactrim for SBP ppx    Hypervolemic hyponatremia Monitor electrolytes   DM2 BG's elevated - stop D10 and watch BG's Will adjust insulin  regimen as able     DVT prophylaxis: SCD - heparin  Code Status: DNR -> ok with intubation, mechanical ventilation, cardioversion  Family Communication: wife Disposition:   Status is: Inpatient Remains inpatient appropriate because: continued need for inpatient care   Consultants:  PCCM Renal  GI  Procedures:  none  Antimicrobials:  Anti-infectives (From admission, onward)    Start     Dose/Rate Route Frequency Ordered Stop   06/26/22 2200  sulfamethoxazole-trimethoprim (BACTRIM DS) 800-160 MG per tablet 1 tablet  Status:  Discontinued        1 tablet Oral Weekly 06/26/22 1022 06/26/22 1024   06/26/22 2200  sulfamethoxazole-trimethoprim (BACTRIM DS) 800-160 MG per tablet 1 tablet        1 tablet Per Tube Weekly 06/26/22 1024     06/26/22 1600  sulfamethoxazole-trimethoprim (BACTRIM DS) 800-160 MG per tablet 1 tablet  Status:  Discontinued       Note to Pharmacy: TAKE 1 TABLET BY MOUTH every friday AFTER dialysis     1 tablet Per Tube Weekly 06/22/22 1828 06/24/22 1533   06/26/22 1015  rifaximin (XIFAXAN) tablet 550 mg        550 mg Per Tube 2 times daily 06/26/22 1013     06/26/22 1000  cefTRIAXone (ROCEPHIN) 1 g in sodium chloride 0.9 % 100 mL IVPB  Status:  Discontinued        1 g 200 mL/hr over 30 Minutes Intravenous Every 24 hours 06/24/22 1533 06/26/22 1022   06/04/2022 2200  rifaximin (XIFAXAN) tablet 550 mg  Status:  Discontinued        550 mg Oral 2 times daily 06/05/2022 1344 06/26/22 1013   06/22/22 2200  rifaximin (XIFAXAN) tablet 550 mg  Status:  Discontinued        550 mg Per Tube 2 times daily 06/22/22 1828 06/06/2022 1344   06/19/22 1600  sulfamethoxazole-trimethoprim (BACTRIM DS) 800-160 MG per tablet 1 tablet  Status:  Discontinued       Note to Pharmacy: TAKE 1 TABLET BY MOUTH every friday AFTER dialysis     1 tablet Oral Weekly 06/14/2022 1534 06/22/22 1828   06/17/22 1800  oseltamivir (TAMIFLU) capsule 30 mg        30 mg Oral Every M-W-F-Sa (1800) 06/17/22  1327 06/19/22 1718   06/17/2022 1845  oseltamivir (TAMIFLU) capsule 30 mg  Status:  Discontinued        30 mg Oral Daily 06/17/2022 1826 06/17/22 1327   06/08/2022 1545  rifaximin (XIFAXAN) tablet 550 mg  Status:  Discontinued        550 mg Oral 2 times daily 06/08/2022 1534 06/22/22 1828       Subjective: No complaints today Some cough   Objective: Vitals:   06/28/22 0420 06/28/22 0733 06/28/22 1108 06/28/22 1624  BP: 123/60 (!) 120/51 (!) 106/46 (!) 106/48  Pulse: 91  95   Resp: (!) 26     Temp: 97.9 F (36.6 C) 97.6 F (36.4 C)  98.5 F (36.9 C)  TempSrc: Axillary Oral  Axillary  SpO2: 95%   97%  Weight:      Height:       No intake  or output data in the 24 hours ending 06/28/22 1833  Filed Weights   06/24/22 0500 06/26/22 0500 06/27/22 0458  Weight: 65.7 kg 71.9 kg 68.7 kg    Examination:  General: No acute distress. Cardiovascular: RRR Lungs: unlabored Abdomen: Soft, nontender, nondistended  Neurological: more oriented today, answers questions appropriately Extremities: No clubbing or cyanosis. No edema.   Data Reviewed: I have personally reviewed following labs and imaging studies  CBC: Recent Labs  Lab 06/22/22 0333 06/22/22 1808 06/23/22 0818 06/23/22 0926 06/09/2022 0440 06/27/2022 0920 06/26/22 0221 06/27/22 0121 06/28/22 0146  WBC 5.4   < > 18.8*   < > 7.4 7.2 13.7* 11.3* 14.2*  NEUTROABS 5.1  --  18.2*  --   --  6.9  --   --   --   HGB 7.4*   < > 7.0*   < > 8.5* 8.5* 8.3* 8.4* 8.5*  HCT 23.8*   < > 21.9*   < > 25.8* 25.0* 25.5* 27.4* 26.8*  MCV 100.8*   < > 96.9   < > 94.9 95.4 95.5 99.6 97.5  PLT 84*   < > 130*   < > 57* 53* 95* 102* 106*   < > = values in this interval not displayed.    Basic Metabolic Panel: Recent Labs  Lab 06/24/22 0443 06/24/22 1640 06/02/2022 0440 06/26/22 0221 06/27/22 0121 06/28/22 0146 06/28/22 0149  NA 131*  --  131* 131* 135  --  136  K 3.5  --  3.5 3.6 4.1  --  4.3  CL 94*  --  95* 97* 99  --  101  CO2 26  --   '26 26 24  '$ --  22  GLUCOSE 245*  --  187* 51* 434*  --  172*  BUN 55*  --  28* 43* 35*  --  51*  CREATININE 2.74*  --  1.79* 2.53* 2.18*  --  2.92*  CALCIUM 8.5*  --  7.8* 8.1* 8.2*  --  8.9  MG 1.9 1.5* 2.3  --  2.0 2.1  --   PHOS 1.9*  1.8* 1.1* 2.0* 3.7 4.6  --  4.6    GFR: Estimated Creatinine Clearance: 20.4 mL/min (Zeddie Njie) (by C-G formula based on SCr of 2.92 mg/dL (H)).  Liver Function Tests: Recent Labs  Lab 06/17/2022 0440 06/26/22 0221 06/27/22 0121 06/28/22 0146 06/28/22 0149  AST  --   --   --  81*  --   ALT  --   --   --  63*  --   ALKPHOS  --   --   --  416*  --   BILITOT  --   --   --  0.7  --   PROT  --   --   --  5.3*  --   ALBUMIN 2.2* 2.1* 2.0* 2.0* 2.0*    CBG: Recent Labs  Lab 06/28/22 0044 06/28/22 0418 06/28/22 0744 06/28/22 1305 06/28/22 1608  GLUCAP 175* 122* 87 200* 180*     No results found for this or any previous visit (from the past 240 hour(s)).        Radiology Studies: No results found.      Scheduled Meds:  allopurinol  100 mg Per Tube Q M,W,F-2000   [START ON 06/29/2022] Chlorhexidine Gluconate Cloth  6 each Topical Q0600   [START ON July 24, 2022] darbepoetin (ARANESP) injection - DIALYSIS  200 mcg Subcutaneous Q Tue-1800   feeding supplement (PROSource TF20)  60  mL Per Tube Daily   insulin aspart  0-15 Units Subcutaneous Q4H   iron polysaccharides  150 mg Per Tube Daily   lactulose  30 g Per Tube TID   metoprolol tartrate  12.5 mg Per Tube BID   mirtazapine  15 mg Oral QHS   multivitamin  1 tablet Per Tube QHS   pantoprazole  40 mg Intravenous Q12H   pravastatin  20 mg Per Tube q AM   rifaximin  550 mg Per Tube BID   sulfamethoxazole-trimethoprim  1 tablet Per Tube Weekly   [START ON 07/05/2022] thiamine  100 mg Oral Daily   venlafaxine  75 mg Per Tube BID WC   Continuous Infusions:  sodium chloride Stopped (06/23/22 0834)   anticoagulant sodium citrate     feeding supplement (VITAL 1.5 CAL) 60 mL/hr at 06/27/22 0600    thiamine (VITAMIN B1) injection 500 mg (06/28/22 1747)   Followed by   Derrill Memo ON 07-02-22] thiamine (VITAMIN B1) injection       LOS: 13 days    Time spent: over 30 min    Fayrene Helper, MD [facility]   To contact the attending provider between 7A-7P or the covering provider during after hours 7P-7A, please log into the web site [email or web address] and access using universal [place] password for that web site. If you do not have the password, please call the hospital operator.  06/28/2022, 6:33 PM

## 2022-06-28 NOTE — Progress Notes (Addendum)
Called by RN to inform me that pt had taken Bipap mask back off. Pt placed back on Bipap at this time. Pt instructed to try to relax and leave mask on. RN made aware of pt being back on Bipap.

## 2022-06-29 ENCOUNTER — Inpatient Hospital Stay (HOSPITAL_COMMUNITY): Payer: Medicare Other

## 2022-06-29 DIAGNOSIS — J9621 Acute and chronic respiratory failure with hypoxia: Secondary | ICD-10-CM | POA: Diagnosis not present

## 2022-06-29 LAB — RENAL FUNCTION PANEL
Albumin: 1.8 g/dL — ABNORMAL LOW (ref 3.5–5.0)
Anion gap: 12 (ref 5–15)
BUN: 64 mg/dL — ABNORMAL HIGH (ref 8–23)
CO2: 24 mmol/L (ref 22–32)
Calcium: 8.7 mg/dL — ABNORMAL LOW (ref 8.9–10.3)
Chloride: 102 mmol/L (ref 98–111)
Creatinine, Ser: 3.6 mg/dL — ABNORMAL HIGH (ref 0.61–1.24)
GFR, Estimated: 17 mL/min — ABNORMAL LOW (ref >60)
Glucose, Bld: 99 mg/dL (ref 70–99)
Phosphorus: 5.1 mg/dL — ABNORMAL HIGH (ref 2.5–4.6)
Potassium: 4.3 mmol/L (ref 3.5–5.1)
Sodium: 138 mmol/L (ref 135–145)

## 2022-06-29 LAB — GLUCOSE, CAPILLARY
Glucose-Capillary: 122 mg/dL — ABNORMAL HIGH (ref 70–99)
Glucose-Capillary: 124 mg/dL — ABNORMAL HIGH (ref 70–99)
Glucose-Capillary: 147 mg/dL — ABNORMAL HIGH (ref 70–99)
Glucose-Capillary: 163 mg/dL — ABNORMAL HIGH (ref 70–99)
Glucose-Capillary: 96 mg/dL (ref 70–99)

## 2022-06-29 LAB — CBC
HCT: 28.5 % — ABNORMAL LOW (ref 39.0–52.0)
Hemoglobin: 8.6 g/dL — ABNORMAL LOW (ref 13.0–17.0)
MCH: 30.3 pg (ref 26.0–34.0)
MCHC: 30.2 g/dL (ref 30.0–36.0)
MCV: 100.4 fL — ABNORMAL HIGH (ref 80.0–100.0)
Platelets: 95 10*3/uL — ABNORMAL LOW (ref 150–400)
RBC: 2.84 MIL/uL — ABNORMAL LOW (ref 4.22–5.81)
RDW: 21.7 % — ABNORMAL HIGH (ref 11.5–15.5)
WBC: 8.7 10*3/uL (ref 4.0–10.5)
nRBC: 0.2 % (ref 0.0–0.2)

## 2022-06-29 LAB — BLOOD GAS, VENOUS
Acid-base deficit: 2.2 mmol/L — ABNORMAL HIGH (ref 0.0–2.0)
Bicarbonate: 27 mmol/L (ref 20.0–28.0)
O2 Saturation: 53 %
Patient temperature: 37
pCO2, Ven: 66 mmHg — ABNORMAL HIGH (ref 44–60)
pH, Ven: 7.22 — ABNORMAL LOW (ref 7.25–7.43)
pO2, Ven: 34 mmHg (ref 32–45)

## 2022-06-29 LAB — MAGNESIUM: Magnesium: 2.3 mg/dL (ref 1.7–2.4)

## 2022-06-29 LAB — HEPATIC FUNCTION PANEL
ALT: 105 U/L — ABNORMAL HIGH (ref 0–44)
AST: 168 U/L — ABNORMAL HIGH (ref 15–41)
Albumin: 1.9 g/dL — ABNORMAL LOW (ref 3.5–5.0)
Alkaline Phosphatase: 466 U/L — ABNORMAL HIGH (ref 38–126)
Bilirubin, Direct: 0.4 mg/dL — ABNORMAL HIGH (ref 0.0–0.2)
Indirect Bilirubin: 0.5 mg/dL (ref 0.3–0.9)
Total Bilirubin: 0.9 mg/dL (ref 0.3–1.2)
Total Protein: 5.3 g/dL — ABNORMAL LOW (ref 6.5–8.1)

## 2022-06-29 LAB — AMMONIA: Ammonia: 26 umol/L (ref 9–35)

## 2022-06-29 MED ORDER — IPRATROPIUM-ALBUTEROL 0.5-2.5 (3) MG/3ML IN SOLN
3.0000 mL | Freq: Four times a day (QID) | RESPIRATORY_TRACT | Status: DC
  Administered 2022-06-29 – 2022-06-30 (×4): 3 mL via RESPIRATORY_TRACT
  Filled 2022-06-29 (×6): qty 3

## 2022-06-29 MED ORDER — VITAL 1.5 CAL PO LIQD
1000.0000 mL | ORAL | Status: DC
  Filled 2022-06-29: qty 1000

## 2022-06-29 MED ORDER — VITAL 1.5 CAL PO LIQD
1000.0000 mL | ORAL | Status: DC
  Filled 2022-06-29 (×2): qty 1000

## 2022-06-29 MED ORDER — ALBUMIN HUMAN 25 % IV SOLN
INTRAVENOUS | Status: AC
  Administered 2022-06-29: 25 g
  Filled 2022-06-29: qty 100

## 2022-06-29 NOTE — Progress Notes (Signed)
RT NOTE:  MD request Pt be taken off BIPAP for a break before bed. Pt tolerating ok at this time.

## 2022-06-29 NOTE — Progress Notes (Signed)
Nutrition Follow-up  DOCUMENTATION CODES:   Severe malnutrition in context of chronic illness  INTERVENTION:  Plan is to condense tube feeds via Cortrak to run during the day as being held each night for BiPAP: -Step 1: Provide Vital 1.5 at 75 mL/hour x 19 hours daily from 0800-0300 -Step 2: Provide Vital 1.5 at 90 mL/hour x 16 hours daily from 0800-0000 -Continue PROSource TF20 60 mL daily per tube. -Goal regimen provides 2240 kcal, 117 grams of protein, 1094 mL H2O daily  Continue Rena-vite QHS per tube.  Continue thiamine per MD.  Will monitor outcome of discussions regarding goals of care.   NUTRITION DIAGNOSIS:   Severe Malnutrition related to chronic illness as evidenced by severe fat depletion, severe muscle depletion.  Ongoing.  GOAL:   Patient will meet greater than or equal to 90% of their needs  Not met at this time with pausing of tube feeds. Addressing with plan to condense feeds during the daytime as they are being held each night.  MONITOR:   Diet advancement, TF tolerance, Labs, Weight trends  REASON FOR ASSESSMENT:   Ventilator, Consult Enteral/tube feeding initiation and management  ASSESSMENT:   Pt with hx of atrial fibrillation, alcoholic cirrhosis with esophageal varices, ESRD on HD, DM type 2, GERD, HTN, and chronic respiratory on home O2 transferred to [facility] from [facility] after presenting for scheduled paracentesis but with complaints of SOB. No HD available at [facility].  1/15: transferred to [facility] 1/18: paracentesis, 2.3 L removed 1/22: rapid response, intubated and transferred to ICU 1/25: extubated 1/26: Cortrak tube placed 1/27: hypercapneic requiring BiPAP 1/29: s/p MBS with recommendation to remain NPO   Reviewed chart and discussed with team. Patient is now requiring nightly BiPAP and tube feeds are being held each night while patient is on BiPAP. Patient currently on nasal cannula 4 L/min. Plan is to condense  feeds to run over 16 hours during daytime to try to maximize amount of nutrition patient receives. Tube feeds are being held each night for BiPAP. Of note, tube feeds are also being held on dialysis days, so on those days pt will still not get goal tube feed regimen. Noted Palliative Medicine was consulted to discuss goals of care. Will continue to monitor discussions regarding goals of care. Met with patient while in HD. He reports he is tolerating tube feeds when they have been running. Discussed plan with patient and he is agreeable to try to condense feeds during the daytime.   Admission wt was 70.2 kg on 1/17. Noted weights have fluctuated between 65-71.9 kg this admission. Noted dry weight 80.5 kg per HD note. Pt is below dry weight. Will continue to monitor weight trends.  Enteral Access: 10 Fr. Cortrak tube placed 1/26; 64 cm at left nare secured with bridle; terminates in distal stomach/proximal duodenum per abdominal x-ray 1/26  Tube feeds: not running at time of RD assessment as held for HD; ordered for Vital 1.5 at 60 mL/hour + PROSource TF 60 mL daily per tube  Medications reviewed and include: allopurinol, Novolog 0-15 units Q4hrs, iron polysaccharides, lactulose 30 grams TID, Remeron 15 mg QHS, Rena-vite QHS per tube, pantoprazole, Bactrium, thiamine 500 mg every 8 hours IV x 3 days followed by thiamine 250 mg daily IV x 5 days followed by thiamine 100 mg daily PO  Labs reviewed: CBG 122-147, BUN 64, Creatinine 3.6, Phosphorus 5.1  UOP: none documented  Rectal tube output: 438 mL  I/O: -764 mL since admission  Discussed with MD via secure chat. Plan is to condense feeds to run over 16 hours during the daytime as they are being held each night for BiPAP.  Diet Order:   Diet Order     None      EDUCATION NEEDS:   Not appropriate for education at this time  Skin:  Skin Assessment: Skin Integrity Issues: Skin Integrity Issues:: Stage III, Other (Comment) Stage III: right  posterior ankle (1.5cm x 1.5cm) Other: skin tear left elbow and right hand, MASD buttocks  Last BM:  06/28/22 - 400 mL rectal tube  Height:   Ht Readings from Last 1 Encounters:  06/23/22 '5\' 7"'$  (1.702 m)   Weight:   Wt Readings from Last 1 Encounters:  06/29/22 70.6 kg   Ideal Body Weight:  67.3 kg  BMI:  Body mass index is 24.38 kg/m.  Estimated Nutritional Needs:   Kcal:  2000-2200 kcal/d  Protein:  100-120g/d  Fluid:  1L+UOP  Loanne Drilling, MS, RD, LDN, CNSC Pager number available on Amion

## 2022-06-29 NOTE — TOC Initial Note (Signed)
Transition of Care [facility]) - Initial/Assessment Note    Patient Details  Name: Jeffrey [name] MRN: [id number] Date of Birth: 1946-03-04  [facility] CM/SW Contact:    Bethena Roys, RN Phone Number: 06/29/2022, 1:19 PM  Clinical Narrative: Risk of readmission assessment completed. Patient transferred from 2-M. PTA patient was from home with spouse. Spouse states she and additional family can provide 24/7 supervision post inpatient rehab. CIR is currently following the patient for transition needs. Patient has DME rolling walker, bedside commode, handicap equipped showers, oxygen via [employer], and a wheelchair in the home. Case Manager will continue to follow for transition of care needs as the patient progresses.               Expected Discharge Plan: IP Rehab Facility Barriers to Discharge: Continued Medical Work up   Patient Goals and CMS Choice Patient states their goals for this hospitalization and ongoing recovery are:: Plan is to  go to rehab.   Choice offered to / list presented to : Spouse      Expected Discharge Plan and Services In-house Referral: Clinical Social Work Discharge Planning Services: CM Consult Post Acute Care Choice: IP Rehab Living arrangements for the past 2 months: Single Family Home     HH Arranged: RN, Disease Management, PT, OT (Active with [facility])    Prior Living Arrangements/Services Living arrangements for the past 2 months: Single Family Home Lives with:: Spouse Patient language and need for interpreter reviewed:: Yes        Need for Family Participation in Patient Care: Yes (Comment) Care giver support system in place?: Yes (comment) Current home services: Home OT, Home PT, Home RN Criminal Activity/Legal Involvement Pertinent to Current Situation/Hospitalization: No - Comment as needed  Activities of Daily Living Home Assistive Devices/Equipment: None ADL Screening (condition at time of admission) Patient's  cognitive ability adequate to safely complete daily activities?: Yes Is the patient deaf or have difficulty hearing?: Yes Does the patient have difficulty seeing, even when wearing glasses/contacts?: Yes Does the patient have difficulty concentrating, remembering, or making decisions?: No Patient able to express need for assistance with ADLs?: No Does the patient have difficulty dressing or bathing?: No Independently performs ADLs?: Yes (appropriate for developmental age) Does the patient have difficulty walking or climbing stairs?: No Weakness of Legs: None Weakness of Arms/Hands: None  Permission Sought/Granted Permission sought to share information with : Family Supports, Case Manager Permission granted to share information with : Yes, Verbal Permission Granted    Emotional Assessment Appearance:: Appears stated age Attitude/Demeanor/Rapport: Unable to Assess Affect (typically observed): Unable to Assess Orientation: : Oriented to Self, Oriented to Place Alcohol / Substance Use: Not Applicable Psych Involvement: No (comment)  Admission diagnosis:  Acute on chronic respiratory failure with hypoxia (HCC) [J96.21] Acute respiratory failure with hypoxia (HCC) [J96.01] Patient Active Problem List   Diagnosis Date Noted   Protein-calorie malnutrition, severe 06/23/2022   Acute on chronic respiratory failure with hypoxia ([place]) 06/10/2022   Influenza A 06/19/2022   Acute respiratory failure with hypoxia ([place]) 06/26/2022   Sepsis due to pneumonia ([place]) 04/22/2022   Chronic respiratory failure with hypoxia (HCC) 02/20/2022   Rectal bleeding 02/19/2022   Symptomatic anemia 07/28/2021   Hypokalemia 07/28/2021   Hypomagnesemia 07/28/2021   Rectal varices    Hyponatremia 01/17/2021   Thrombocytopenia ([place]) 01/17/2021   B12 deficiency 11/21/2020   Iron deficiency anemia due to chronic blood loss 06/25/2020   Gastroesophageal reflux disease 06/06/2020  Bacteremia 04/03/2020   Acute on  chronic respiratory failure with hypoxia and hypercapnia (HCC) 04/03/2020   Lobar pneumonia ([place]) 04/03/2020   Volume overload 04/02/2020   Asymptomatic bacteriuria 04/02/2020   Pain of upper abdomen 02/22/2020   Urine leukocytes increased 02/22/2020   Minor head injury    Physical deconditioning    Acute hyponatremia 02/19/2020   Fever and chills 02/09/2020   Advanced care planning/counseling discussion    DNR (do not resuscitate)    Alcoholic cirrhosis of liver ([place]) 02/08/2020   Back pain 02/08/2020   Status post thoracentesis    Bilateral pleural effusion 12/31/2019   Hypoglycemia 12/31/2019   Hyperammonemia ([place]) 12/31/2019   Class 2 obesity 12/31/2019   Hypoalbuminemia 12/31/2019   Nosebleed    Goals of care, counseling/discussion    Palliative care by specialist    Acute hepatic encephalopathy ([place])    Hyperkalemia    Chronic hyponatremia    Anemia 11/16/2019   GI bleed    Generalized weakness 09/18/2019   Controlled type 2 diabetes mellitus with chronic kidney disease on chronic dialysis, with long-term current use of insulin ([place]) 09/18/2019   ESRD on dialysis ([place]) 09/18/2019   Clostridioides difficile infection 08/31/2019   Edema 49/67/5916   Alcoholic cirrhosis ([place]) 38/46/6599   Essential hypertension, benign 10/20/2017   Mixed hyperlipidemia 10/20/2017   Ascites    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 [place] [address] Phone: [phone] Fax: [phone]  Social Determinants of Health (SDOH) Social History: SDOH  Screenings   Food Insecurity: No Food Insecurity (06/16/2022)  Housing: Low Risk  (06/16/2022)  Transportation Needs: No Transportation Needs (06/16/2022)  Utilities: Not At Risk (06/16/2022)  Depression (PHQ2-9): Low Risk  (03/27/2021)  Physical Activity: Inactive (05/30/2020)  Tobacco Use: Medium Risk (06/18/2022)   Readmission Risk Interventions    06/29/2022    1:12 PM 04/30/2022   10:22 AM 02/07/2022   12:34 PM  Readmission Risk Prevention Plan  Transportation Screening Complete Complete Complete  PCP or Specialist Appt within 3-5 Days  Not Complete Complete  HRI or [place]  Complete Complete  Social Work Consult for [place] Planning/Counseling  Complete Complete  Palliative Care Screening  Complete Not Applicable  Medication Review Press photographer) Referral to Pharmacy Complete Complete  HRI or [place] Complete    SW Recovery Care/Counseling Consult Complete    Palliative Care Screening Not [place] Not Applicable

## 2022-06-29 NOTE — Progress Notes (Signed)
Occupational Therapy Treatment Patient Details Name: Jeffrey [name] MRN: [id number] DOB: [birth year] Today's Date: 06/29/2022   History of present illness 77 yo male admitted 06/21/2022 due to SOB/acute respiratory distress found to be positive for flu.1/22 respiratory distress and hypotensions during HD requiring intubation. Extubated 1/25. PMH: DM, HTN, GIB, chronic resp failure on 2 L, Afib, ESRD on HD MWF, cirrhosis requiring periodic paracentesis.   OT comments  Patient alert and agreeable to OT session. Patient difficult to understand due to low volume, whispering speech. Patient demonstrated improvement with sitting balance with min guard for safety and able to perform grooming and reaching tasks. Patient with difficulty standing, unable to come to complete stand x2 attempts with face to face technique. Patient to continue to be followed by acute OT with potential for gains with transfers and self care.    Recommendations for follow up therapy are one component of a multi-disciplinary discharge planning process, led by the attending physician.  Recommendations may be updated based on patient status, additional functional criteria and insurance authorization.    Follow Up Recommendations  Acute inpatient rehab (3hours/day)     Assistance Recommended at Discharge Frequent or constant Supervision/Assistance  Patient can return home with the following  A lot of help with walking and/or transfers;A lot of help with bathing/dressing/bathroom;Assistance with cooking/housework;Help with stairs or ramp for entrance;Assist for transportation   Equipment Recommendations  None recommended by OT    Recommendations for Other Services      Precautions / Restrictions Precautions Precautions: Fall Precaution Comments: flexiseal, cortrak Restrictions Weight Bearing Restrictions: No       Mobility Bed Mobility Overal bed mobility: Needs Assistance Bed Mobility: Supine to Sit, Sit to Supine      Supine to sit: HOB elevated, Mod assist Sit to supine: Mod assist   General bed mobility comments: required assistance with BLEs and used bed pad to scoot hips towards EOB and return to supine    Transfers Overall transfer level: Needs assistance Equipment used: None Transfers: Sit to/from Stand Sit to Stand: Max assist           General transfer comment: attempted standing with face to face technique with patient unable to come to complete stand x2 attempts     Balance Overall balance assessment: Needs assistance Sitting-balance support: No upper extremity supported, Feet supported Sitting balance-Leahy Scale: Fair Sitting balance - Comments: min guard for safety with sitting on EOB   Standing balance support: Bilateral upper extremity supported, During functional activity Standing balance-Leahy Scale: Zero Standing balance comment: unable to come to complete stand with face to face technique and knees blocked x2 attempts                           ADL either performed or assessed with clinical judgement   ADL Overall ADL's : Needs assistance/impaired     Grooming: Wash/dry hands;Wash/dry face;Min guard;Sitting Grooming Details (indicate cue type and reason): on EOB                               General ADL Comments: focused on EOB sitting balance    Extremity/Trunk Assessment              Vision       Perception     Praxis      Cognition Arousal/Alertness: Awake/alert Behavior During Therapy: Flat affect Overall Cognitive Status: Difficult to  assess Area of Impairment: Safety/judgement, Problem solving, Attention, Following commands, Awareness, Memory                 Orientation Level: Disoriented to, Place, Time, Situation   Memory: Decreased short-term memory Following Commands: Follows one step commands inconsistently, Follows one step commands with increased time Safety/Judgement: Decreased awareness of safety,  Decreased awareness of deficits   Problem Solving: Slow processing, Decreased initiation, Difficulty sequencing, Requires tactile cues, Requires verbal cues General Comments: alert, difficult to understand due to wispering, mouthing words        Exercises      Shoulder Instructions       General Comments      Pertinent Vitals/ Pain       Pain Assessment Pain Assessment: Faces Faces Pain Scale: Hurts a little bit Pain Location: buttocks Pain Descriptors / Indicators: Discomfort Pain Intervention(s): Monitored during session, Repositioned  Home Living                                          Prior Functioning/Environment              Frequency  Min 2X/week        Progress Toward Goals  OT Goals(current goals can now be found in the care plan section)  Progress towards OT goals: Progressing toward goals  Acute Rehab OT Goals Patient Stated Goal: none stated OT Goal Formulation: Patient unable to participate in goal setting Time For Goal Achievement: 07/02/22 Potential to Achieve Goals: Good ADL Goals Pt Will Perform Lower Body Bathing: with min assist;sitting/lateral leans;sit to/from stand Pt Will Perform Lower Body Dressing: with min assist;sitting/lateral leans;sit to/from stand Pt Will Transfer to Toilet: with supervision;ambulating;regular height toilet Pt Will Perform Toileting - Clothing Manipulation and hygiene: with min assist;sitting/lateral leans;sit to/from stand Pt/caregiver will Perform Home Exercise Program: Increased strength;Both right and left upper extremity;With Supervision;With written HEP provided  Plan Discharge plan needs to be updated    Co-evaluation                 AM-PAC OT "6 Clicks" Daily Activity     Outcome Measure   Help from another person eating meals?: Total Help from another person taking care of personal grooming?: A Lot Help from another person toileting, which includes using toliet, bedpan,  or urinal?: Total Help from another person bathing (including washing, rinsing, drying)?: A Lot Help from another person to put on and taking off regular upper body clothing?: A Lot Help from another person to put on and taking off regular lower body clothing?: Total 6 Click Score: 9    End of Session Equipment Utilized During Treatment: Oxygen  OT Visit Diagnosis: Muscle weakness (generalized) (M62.81);Unsteadiness on feet (R26.81)   Activity Tolerance Patient tolerated treatment well   Patient Left in bed;with call bell/phone within reach;with bed alarm set;Other (comment) (patient left with ST)   Nurse Communication Mobility status        Time: 9937-1696 OT Time Calculation (min): 22 min  Charges: OT General Charges $OT Visit: 1 Visit OT Treatments $Therapeutic Activity: 8-22 mins  Lodema Hong, [place]  Office [phone]   Trixie Dredge 06/29/2022, 9:39 AM

## 2022-06-29 NOTE — Progress Notes (Signed)
PROGRESS NOTE    Jeffrey [name]  YKZ:[id number] DOB: 07-Jan-1946 DOA: 06/02/2022 PCP: Jeffrey Blitz, MD  Chief Complaint  Patient presents with   Respiratory Distress    Brief Narrative:  77 yo M PMH ESRD on MWF HD, alcoholic cirrhosis, DM2, Chronic hypoxic respiratory failure on 2L, Afib, cirrhosis was admitted 1/15 for acute on chronic hypoxic resp failure, Flu PNA.   On  1/22 he was more lethargic. While in iHD he was started on BiPAP but remained lethargic.  ABG with resp acidosis    Significant events 1/15 admit to [place] from [facility] for iHD need 1/15 nephro consult, HD  1/16 Flu Jeffrey [name] pos, tamiflu  1/18 para, no SBP  1/19 steroids added for incr O2 need  1/22 PCCM consulted for AMS hypercarbic resp failure, SOB. Transfer to ICU for intubation  1/27 TRH assumed care   Assessment & Plan:   Principal Problem:   Acute on chronic respiratory failure with hypoxia (HCC) Active Problems:   Influenza Jeffrey [name]   Acute hepatic encephalopathy (HCC)   ESRD on dialysis (Jeffrey [name])   Alcoholic cirrhosis of liver with ascites (Jeffrey [name])   Esophageal varices in alcoholic cirrhosis (Jeffrey [name])   Acute hyponatremia   Controlled type 2 diabetes mellitus with chronic kidney disease on chronic dialysis, with long-term current use of insulin (HCC)   Protein-calorie malnutrition, severe   Acute metabolic encephalopathy  Hepatic Encephalopathy Multifactorial.  Primarily driven by hypercarbia at this time.  Hepatic encephalopathy, uremia other contributing causes.   Nightly bipap Bipap ordered for now with continued resp acidosis (will likely be once he gets back from dialysis as I doubt they'll do bipap in HD) Lactulose, rifaximin Minimize sedating meds - taper remeron and effexor as well to avoid withdrawal (discussed with pharmacy).  Remove prn xanax from med list. High dose thiamine Follow repeat ammonia levels (normalized)  Dialysis per renal    Acute on chronic respiratory failure with hypoxia and hypercapnia Volume  Overload  Pulmonary Edema  Bilateral Pulmonary Effusions Influenza Jeffrey [name] PNA -currently on 6 L -VBG with hypercarbic respiratory failure, bipap ordered for now (as above, suspect this will be after he gets back from dialysis, which I think will help as well given his CXR findings concerning for edema) -CXR with bibasilar effusions, pulmonary edema -scheduled nebs -completed tamiflu  -No indication for antibiotic or steroid -needs nightly bipap  Dysphagia With NG tube for meds/tube feeds SLP recommending NPO Hold when on bipap Appreciate SLP/RD  Acute on chronic deficiency anemia History of GI bleed Patient had 2 EGD in September 2023 and had APC for bleeding gastropathy.  He only has grade 1 varices.  He had Jeffrey [name] colonoscopy in 12/2018 through which had Jeffrey [name] possible bleeding polypoid lesion in the splenic flexure. Hb down to 5.6 on 1/24, improved to 8 after 2 units pRBC Relatively stable since around 8.4 GI was consulted 1/25, they're planning to defer EGD if no evidence active GI bleed, recent endoscopic eval within 6 months. -Continue PPI 40 mg twice daily -iron def and chronic disease    Shock of multifactorial causes-resolved -Discontinue midodrine   Thrombocytopenia His baseline platelets around 100s. 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  He has other explanation for thrombocytopenia including cirrhosis, ESRD and acute illness. Platelet trending upward to 106.  Peripheral blood smear unremarkable, no schistocytes to suspect DIC. -Pending HIT antibody results -> wnl -SCD for DVT proph.  Will hold off of heparin for now with concern for bleeding above as well as thrombocytopenia.  Will consider resumption of subcutaneous heparin if remains stable.   Jeffrey [name] with RVR -Amiodarone 100 mg daily -He is not on anticoagulation at home. Could consider eliquis.  Per critical care note, his HAS-BLED score is 5 indicates high risk of bleeding. This will be Jeffrey [name] risk vs benefit conversation with family.   Will hold anticoagulation at this time given his encephalopathy.   ESRD on HD MWF -Nephrology is following   Decompensated alcoholic cirrhosis with ascites with esophageal varices and encephalopathy Patient had paracentesis done on 1/18, which was negative for SBP Continue lactulose, rifaximin, inderol   Bactrim for SBP ppx    Hypervolemic hyponatremia Monitor electrolytes   DM2 BG's elevated - stop D10 and watch BG's Will adjust insulin regimen as able     DVT prophylaxis: SCD - heparin  Code Status: DNR -> ok with intubation, mechanical ventilation, cardioversion  Family Communication: wife Disposition:   Status is: Inpatient Remains inpatient appropriate because: continued need for inpatient care   Consultants:  PCCM Renal  GI  Procedures:  none  Antimicrobials:  Anti-infectives (From admission, onward)    Start     Dose/Rate Route Frequency Ordered Stop   06/26/22 2200  sulfamethoxazole-trimethoprim (BACTRIM DS) 800-160 MG per tablet 1 tablet  Status:  Discontinued        1 tablet Oral Weekly 06/26/22 1022 06/26/22 1024   06/26/22 2200  sulfamethoxazole-trimethoprim (BACTRIM DS) 800-160 MG per tablet 1 tablet        1 tablet Per Tube Weekly 06/26/22 1024     06/26/22 1600  sulfamethoxazole-trimethoprim (BACTRIM DS) 800-160 MG per tablet 1 tablet  Status:  Discontinued       Note to Pharmacy: TAKE 1 TABLET BY MOUTH every friday AFTER dialysis     1 tablet Per Tube Weekly 06/22/22 1828 06/24/22 1533   06/26/22 1015  rifaximin (XIFAXAN) tablet 550 mg        550 mg Per Tube 2 times daily 06/26/22 1013     06/26/22 1000  cefTRIAXone (ROCEPHIN) 1 g in sodium chloride 0.9 % 100 mL IVPB  Status:  Discontinued        1 g 200 mL/hr over 30 Minutes Intravenous Every 24 hours 06/24/22 1533 06/26/22 1022   06/08/2022 2200  rifaximin (XIFAXAN) tablet 550 mg  Status:  Discontinued        550 mg Oral 2 times daily 06/01/2022 1344 06/26/22 1013   06/22/22 2200  rifaximin  (XIFAXAN) tablet 550 mg  Status:  Discontinued        550 mg Per Tube 2 times daily 06/22/22 1828 06/03/2022 1344   06/19/22 1600  sulfamethoxazole-trimethoprim (BACTRIM DS) 800-160 MG per tablet 1 tablet  Status:  Discontinued       Note to Pharmacy: TAKE 1 TABLET BY MOUTH every friday AFTER dialysis     1 tablet Oral Weekly 06/17/2022 1534 06/22/22 1828   06/17/22 1800  oseltamivir (TAMIFLU) capsule 30 mg        30 mg Oral Every M-W-F-Sa (1800) 06/17/22 1327 06/19/22 1718   06/07/2022 1845  oseltamivir (TAMIFLU) capsule 30 mg  Status:  Discontinued        30 mg Oral Daily 06/06/2022 1826 06/17/22 1327   06/14/2022 1545  rifaximin (XIFAXAN) tablet 550 mg  Status:  Discontinued        550 mg Oral 2 times daily 06/19/2022 1534 06/22/22 1828       Subjective: C/o SOB  Objective: Vitals:   06/28/22 0420 06/28/22  0350 06/28/22 1108 06/28/22 1624  BP: 123/60 (!) 120/51 (!) 106/46 (!) 106/48  Pulse: 91  95   Resp: (!) 26     Temp: 97.9 F (36.6 C) 97.6 F (36.4 C)  98.5 F (36.9 C)  TempSrc: Axillary Oral  Axillary  SpO2: 95%   97%  Weight:      Height:       No intake or output data in the 24 hours ending 06/28/22 1833  Filed Weights   06/24/22 0500 06/26/22 0500 06/27/22 0458  Weight: 65.7 kg 71.9 kg 68.7 kg    Examination:  General: No acute distress. Cardiovascular: RRR Lungs: rhochi Abdomen: Soft, nontender, nondistended  Neurological: encephalopathic, when I asked him to stretch out hands, he left them outstretched even after I told him he could relax  Extremities: No clubbing or cyanosis. No edema.  Data Reviewed: I have personally reviewed following labs and imaging studies  CBC: Recent Labs  Lab 06/22/22 0333 06/22/22 1808 06/23/22 0818 06/23/22 0926 06/12/2022 0440 06/20/2022 0920 06/26/22 0221 06/27/22 0121 06/28/22 0146  WBC 5.4   < > 18.8*   < > 7.4 7.2 13.7* 11.3* 14.2*  NEUTROABS 5.1  --  18.2*  --   --  6.9  --   --   --   HGB 7.4*   < > 7.0*   < > 8.5* 8.5*  8.3* 8.4* 8.5*  HCT 23.8*   < > 21.9*   < > 25.8* 25.0* 25.5* 27.4* 26.8*  MCV 100.8*   < > 96.9   < > 94.9 95.4 95.5 99.6 97.5  PLT 84*   < > 130*   < > 57* 53* 95* 102* 106*   < > = values in this interval not displayed.    Basic Metabolic Panel: Recent Labs  Lab 06/24/22 0443 06/24/22 1640 06/29/2022 0440 06/26/22 0221 06/27/22 0121 06/28/22 0146 06/28/22 0149  NA 131*  --  131* 131* 135  --  136  K 3.5  --  3.5 3.6 4.1  --  4.3  CL 94*  --  95* 97* 99  --  101  CO2 26  --  '26 26 24  '$ --  22  GLUCOSE 245*  --  187* 51* 434*  --  172*  BUN 55*  --  28* 43* 35*  --  51*  CREATININE 2.74*  --  1.79* 2.53* 2.18*  --  2.92*  CALCIUM 8.5*  --  7.8* 8.1* 8.2*  --  8.9  MG 1.9 1.5* 2.3  --  2.0 2.1  --   PHOS 1.9*  1.8* 1.1* 2.0* 3.7 4.6  --  4.6    GFR: Estimated Creatinine Clearance: 20.4 mL/min (Raeleen Winstanley) (by C-G formula based on SCr of 2.92 mg/dL (H)).  Liver Function Tests: Recent Labs  Lab 06/22/2022 0440 06/26/22 0221 06/27/22 0121 06/28/22 0146 06/28/22 0149  AST  --   --   --  81*  --   ALT  --   --   --  63*  --   ALKPHOS  --   --   --  416*  --   BILITOT  --   --   --  0.7  --   PROT  --   --   --  5.3*  --   ALBUMIN 2.2* 2.1* 2.0* 2.0* 2.0*    CBG: Recent Labs  Lab 06/28/22 0044 06/28/22 0418 06/28/22 0744 06/28/22 1305 06/28/22 1608  GLUCAP 175* 122* 87  200* 180*     No results found for this or any previous visit (from the past 240 hour(s)).        Radiology Studies: No results found.      Scheduled Meds:  allopurinol  100 mg Per Tube Q M,W,F-2000   [START ON 06/29/2022] Chlorhexidine Gluconate Cloth  6 each Topical Q0600   [START ON Jul 14, 2022] darbepoetin (ARANESP) injection - DIALYSIS  200 mcg Subcutaneous Q Tue-1800   feeding supplement (PROSource TF20)  60 mL Per Tube Daily   insulin aspart  0-15 Units Subcutaneous Q4H   iron polysaccharides  150 mg Per Tube Daily   lactulose  30 g Per Tube TID   metoprolol tartrate  12.5 mg Per Tube  BID   mirtazapine  15 mg Oral QHS   multivitamin  1 tablet Per Tube QHS   pantoprazole  40 mg Intravenous Q12H   pravastatin  20 mg Per Tube q AM   rifaximin  550 mg Per Tube BID   sulfamethoxazole-trimethoprim  1 tablet Per Tube Weekly   [START ON 07/05/2022] thiamine  100 mg Oral Daily   venlafaxine  75 mg Per Tube BID WC   Continuous Infusions:  sodium chloride Stopped (06/23/22 0834)   anticoagulant sodium citrate     feeding supplement (VITAL 1.5 CAL) 60 mL/hr at 06/27/22 0600   thiamine (VITAMIN B1) injection 500 mg (06/28/22 1747)   Followed by   Derrill Memo ON 2022/07/14] thiamine (VITAMIN B1) injection       LOS: 13 days    Time spent: over 30 min    Fayrene Helper, MD [facility]   To contact the attending provider between 7A-7P or the covering provider during after hours 7P-7A, please log into the web site [email or web address] and access using universal [place] password for that web site. If you do not have the password, please call the hospital operator.  06/28/2022, 6:33 PM

## 2022-06-29 NOTE — Progress Notes (Signed)
   06/29/22 1611  Vitals  Temp 97.8 F (36.6 C)  Temp Source Axillary  BP (!) 102/45  BP Location Right Arm  BP Method Automatic  Pulse Rate 84  ECG Heart Rate 85  Resp (!) 29  Oxygen Therapy  SpO2 100 %  O2 Device Bi-PAP  During Treatment Monitoring  Intra-Hemodialysis Comments Tx completed (patient seen by Veneta Penton PA at bedside; tx ended early d/t hypotension despite Albumin 25g administration.)  Post Treatment  Dialyzer Clearance Lightly streaked  Duration of HD Treatment -hour(s) 2.07 hour(s)  Hemodialysis Intake (mL) 0 mL  Liters Processed 45.1  Fluid Removed (mL) -500 mL  Tolerated HD Treatment  (see PN)  AVG/AVF Arterial Site Held (minutes) 5 minutes  AVG/AVF Venous Site Held (minutes) 5 minutes  Fistula / Graft Left Upper arm Arteriovenous fistula  No placement date or time found.   Orientation: Left  Access Location: Upper arm  Access Type: Arteriovenous fistula  Site Condition No complications  Fistula / Graft Assessment Present;Thrill  Status Deaccessed  Drainage Description None

## 2022-06-29 NOTE — Progress Notes (Signed)
Modified Barium Swallow Progress Note  Patient Details  Name: Jeffrey [name] MRN: [id number] Date of Birth: [birth year]  Today's Date: 06/29/2022  Modified Barium Swallow completed.  Full report located under Chart Review in the Imaging Section.  Brief recommendations include the following:  Clinical Impression  Pt exhibits moderate oral and mod-severe pharyngeal dysphagia. Oral phase noted for reduced lingual to palatal seal, delayed and inefficient propulsion with decreased cohesion of bolus. Pt had laryngeal penetration and aspiration with all consistencies attempted (thin, nectar and puree) with delayed initition, decreased laryngeal elevation and closure, reduced tongue base retraction and epiglottic deflection. There is also reduced pharyngoesophageal segment distention causing reduced transit into esophagus material collecting above the PES. Pt was cued to produce multiple swallows however this resulted in further penetration and aspiration with each attempt. He was prompted for throat clear which he did without clearance and he was unable to produce strong cough (is currently aphonic). Recommend he continue with NPO status (has Cortrak) and ST will continue to work with pt moving toward po recommendation. Called wife with results and she denies dysphagia prior to this admission.   Swallow Evaluation Recommendations       SLP Diet Recommendations: NPO       Medication Administration: Via alternative means               Oral Care Recommendations: Oral care QID        Houston Siren 06/29/2022,10:48 AM

## 2022-06-29 NOTE — Progress Notes (Signed)
RT removed pt from BiPAP and placed pt on 4L [place]. RT left BiPAP on stby at bedside. RT will continue to monitor pt.

## 2022-06-29 NOTE — Progress Notes (Signed)
Speech Language Pathology Treatment: Dysphagia  Patient Details Name: Jeffrey [name] MRN: [id number] DOB: 1945-08-17 Today's Date: 06/29/2022 Time: 4196-2229 SLP Time Calculation (min) (ACUTE ONLY): 8 min  Assessment / Plan / Recommendation Clinical Impression  Pt seen for dysphagia with wife on Facetime. He continues to be aphonic and whispers. Wife states she took his dentures home which he does use when eating. He coughed before and immediately after cup sips of water, mildly increased work of breathing and has respiratory needs for Bipap at night. Given 3 day intubation with continued suspected decreased airway protection, he would benefit from instrumental study with MBS which is scheduled for this morning. Therapist asked wife who said she would bring his dentures to the hospital to assess what texture he will be able to effectively masticate.    HPI HPI: 77 yo male admitted 06/28/2022 due to SOB/acute respiratory distress found to be positive for flu.1/22 respiratory distress and hypotensions during HD requiring intubation. ETT 1/22-25. PMH: DM, HTN, GIB, chronic resp failure on 2 L, Afib, ESRD on HD MWF, cirrhosis requiring periodic paracentesis.      SLP Plan  MBS      Recommendations for follow up therapy are one component of a multi-disciplinary discharge planning process, led by the attending physician.  Recommendations may be updated based on patient status, additional functional criteria and insurance authorization.    Recommendations  Diet recommendations: NPO Medication Administration: Via alternative means                Oral Care Recommendations: Oral care QID Follow Up Recommendations:  (TBD) SLP Visit Diagnosis: Dysphagia, unspecified (R13.10) Plan: MBS           Rayjon Wery, Orbie Pyo  06/29/2022, 8:51 AM

## 2022-06-29 NOTE — Progress Notes (Signed)
PT Cancellation Note  Patient Details Name: Jeffrey [name] MRN: [id number] DOB: [birth year]   Cancelled Treatment:    Reason Eval/Treat Not Completed: Other (comment).  Gone to HD, retry tomorrow.   Ramond Dial 06/29/2022, 3:13 PM  Mee Hives, PT PhD Acute Rehab Dept. Number: [place] [facility] [place]

## 2022-06-29 NOTE — Progress Notes (Signed)
RT assisted with transport of this patient from HD to 6E11 while on BIPAP.[name] Patient tolerated well with SVS.

## 2022-06-29 NOTE — Progress Notes (Signed)
RT called by RN at 1400 stating that MD requested for pt to be placed back on BiPAP in HD due to increased WOB. RT made RN aware that NG tube feeds will have to be held prior to BiPAP placement. RT at bedside in HD and placed pt back on BiPAP at 1448. Pt in no obvious distress but does appear slightly labored. Pt tolerating well at this time. HD staff notified that pt back on BiPAP. RT will continue to monitor pt.

## 2022-06-29 NOTE — Progress Notes (Addendum)
NAME:  Jeffrey [name], MRN:  [id number], DOB:  [birth year], LOS: 20 ADMISSION DATE:  06/02/2022, CONSULTATION DATE:  06/22/22 REFERRING MD:  Sabino Gasser TRH, CHIEF COMPLAINT:  AMS   History of Present Illness:  77 yo M PMH ESRD on MWF HD, alcoholic cirrhosis, DM2, Chronic hypoxic respiratory failure on 2L, Afib, cirrhosis was admitted 1/15 for acute on chronic hypoxic resp failure, Flu PNA.   On  1/22 he was more lethargic required BiPAP and then intubation for acute respiratory acidosis.  He was had to be extubated on 1/25, moved to the floor 1/26 A bit less responsive on 1/27, ABG revealed recurrent severe respiratory acidosis.  PCCM consulted to evaluate and BiPAP reinitiated.    Pertinent  Medical History   Past Medical History:  Diagnosis Date   A-fib [facility])    when initially stating dialysis january 2021 at [facility] per spouse; never heard anything else about it   Alcoholic cirrhosis [facility])    patient reports completing hep a and b vaccines in 2006   Anemia    has had 3 units of prbcs 2013   Anxiety    Arthritis    Asymptomatic gallstones    Ultrasound in 2006   B12 deficiency 11/21/2020   C. difficile colitis    Cholelithiasis    Chronic kidney disease    Dialysis M/W/F/Sa   Depression    Diabetes ([place])    Duodenal ulcer with hemorrhage    per patient in 2006 or 2007, records have been requested   Dyspnea    low oxygen level -  has O2 2 L all day   Esophageal varices ([place])    see PSH   GERD (gastroesophageal reflux disease)    Heart burn    History of alcohol abuse    quit 05/2013   HOH (hard of hearing)    Hypertension    Liver cirrhosis ([place])    ETOH   SBP (spontaneous bacterial peritonitis) ([place])    2020, November 2021.    Splenomegaly    Ultrasound in 2006    Significant Hospital Events: Including procedures, antibiotic start and stop dates in addition to other pertinent events   1/15 admit to [facility] from [facility] for iHD need 1/15 nephro consult, HD  1/16  Flu A pos, tamiflu  1/18 para, no SBP  1/19 steroids added for incr O2 need  1/22 PCCM consulted for AMS hypercarbic resp failure, SOB. Transfer to ICU for intubation  1/25 Extubated To floor 1/26 Hypercapneic and required BiPAP 1/27  Interim History / Subjective:  Weaker today. Showing some increase in WOB   Objective   Blood pressure (Abnormal) 123/48, pulse 80, temperature 97.8 F (36.6 C), temperature source Axillary, resp. rate (Abnormal) 25, height '5\' 7"'$  (1.702 m), weight 68.7 kg, SpO2 97 %.    Vent Mode: BIPAP FiO2 (%):  [50 %] 50 % Set Rate:  [0 bmp-10 bmp] 0 bmp PEEP:  [5 cmH20] 5 cmH20   Intake/Output Summary (Last 24 hours) at 06/29/2022 1231 Last data filed at 06/28/2022 2125 Gross per 24 hour  Intake 60 ml  Output 400 ml  Net -340 ml    Filed Weights   06/24/22 0500 06/26/22 0500 06/27/22 0458  Weight: 65.7 kg 71.9 kg 68.7 kg    Examination: General: Chronically ill-appearing 77 year old male, work of breathing a bit worse today HEENT normocephalic atraumatic does have temporal wasting phonation quality is quite poor Pulmonary: Diminished throughout, does exhibit accessory use today with abdominal  accessory muscles Cardiac: Regular rate and rhythm Abdomen: Soft nontender Extremities: Warm dry scattered areas of ecchymosis Neuro: Awake, oriented x 1, speaks in whispers.  He is profoundly weak and deconditioned.  Resolved Hospital Problem list   Shock  Assessment & Plan:  Acute/subacute metabolic encephalopathy Acute on chronic respiratory failure with hypoxia and hypercapnia Influenza A PNA Acute on chronic deficiency anemia History of GI bleed Thrombocytopenia, improved to his usual baseline A-fib with RVR ESRD on HD MWF Decompensated alcoholic cirrhosis with ascites with esophageal varices and encephalopathy DM2 with hyperglycemia  Pulm problem list  Acute on chronic Hypoxic and Hypercarbic respiratory failure 2/2 mixed picture of recent PNA,  prob element of pulmonary edema but most significantly driven by severe deconditioning and decreased abd compliance.    Unfortunately I do not think there is much in the way of reversibility for this gentleman.  If he were to be intubated it would likely rapidly correct any hypercarbia unfortunately once extubated I think his underlying problems will still be there hence risk for going right back to where he was is quite high  Plan Agree with chest x-ray, diuresis if able Continue BiPAP at at bedtime and as needed I would reengage palliative care, unfortunately little to offer here I do not see a role for systemic steroids Trial of bronchodilators per my discussion with internal medicine is reasonable, I am not convinced it will help much  Best Practice (right click and "Reselect all SmartList Selections" daily)    Per primary  Erick Colace ACNP-BC [place] Pager # [phone] OR # [phone] if no answer   Attending:    Subjective: Worsening pCO2, called back for the same He says his breathing isn't much better On HD on my review  Objective: Vitals:   06/29/22 1336 06/29/22 1350 06/29/22 1400 06/29/22 1430  BP: (!) 98/34 (!) 96/41 (!) 95/54 (!) 90/39  Pulse: 82 79 78 81  Resp: (!) 24 (!) 25 (!) 22 (!) 25  Temp: (!) 97.3 F (36.3 C)     TempSrc: Oral     SpO2: 97% 97% 98% 95%  Weight: 70.6 kg     Height:       Vent Mode: BIPAP FiO2 (%):  [50 %] 50 % Set Rate:  [0 bmp-10 bmp] 0 bmp PEEP:  [5 cmH20] 5 cmH20  Intake/Output Summary (Last 24 hours) at 06/29/2022 1443 Last data filed at 06/28/2022 2125 Gross per 24 hour  Intake 60 ml  Output 400 ml  Net -340 ml    General:  very weak, chronically ill appearing in bed on hemodialysis HENT: NCAT OP clear PULM: Diminished R base, otherwise clear bilaterally, normal effort CV: Irreg irreg, no mgr GI: BS+, soft, nontender MSK: normal bulk and tone Neuro: awake, alert, no distress, MAEW   CBC     Component Value Date/Time   WBC 8.7 06/29/2022 0207   RBC 2.84 (L) 06/29/2022 0207   HGB 8.6 (L) 06/29/2022 0207   HCT 28.5 (L) 06/29/2022 0207   HCT 28 02/25/2012 1034   PLT 95 (L) 06/29/2022 0207   PLT 125 02/25/2012 1034   MCV 100.4 (H) 06/29/2022 0207   MCV 82.0 02/25/2012 1034   MCH 30.3 06/29/2022 0207   MCHC 30.2 06/29/2022 0207   RDW 21.7 (H) 06/29/2022 0207   LYMPHSABS 0.1 (L) 06/17/2022 0920   MONOABS 0.2 06/03/2022 0920   EOSABS 0.0 06/23/2022 0920   BASOSABS 0.0 06/17/2022 0920    BMET  Component Value Date/Time   NA 138 06/29/2022 0208   K 4.3 06/29/2022 0208   CL 102 06/29/2022 0208   CO2 24 06/29/2022 0208   GLUCOSE 99 06/29/2022 0208   BUN 64 (H) 06/29/2022 0208   CREATININE 3.60 (H) 06/29/2022 0208   CREATININE 2.65 (H) 11/16/2019 1203   CALCIUM 8.7 (L) 06/29/2022 0208   CALCIUM 8.3 (L) 09/19/2020 1024   GFRNONAA 17 (L) 06/29/2022 0208   GFRNONAA 23 (L) 11/16/2019 1203   GFRAA 15 (L) 02/20/2020 0636   GFRAA 27 (L) 11/16/2019 1203   ABG    Component Value Date/Time   PHART 7.31 (L) 06/28/2022 0602   PCO2ART 55 (H) 06/28/2022 0602   PO2ART 67 (L) 06/28/2022 0602   HCO3 27.0 06/29/2022 1220   TCO2 29 06/23/2022 0926   ACIDBASEDEF 2.2 (H) 06/29/2022 1220   O2SAT 53 06/29/2022 1220    CXR images from today personally reviewed, ? RML collapse vs partial segmental collapse RLL, minimal effusion, bilateral lymphatic and vascular engorgement  Impression/Plan: Acute respiratory failure with hypercarbia> no underlying lung disease but suspect this is a consequence of his generalized weakness, volume overload and generally reflective of his poor prognosis.  Agree with volume removal per renal, nocturnal and prn NIMV and discussion with palliative care.  Typically would encourage more mobility, up in chair, incentive spirometry.  However considering his overall very poor predicted prognosis it is reasonable to consider his goals of care further with  palliative medicine and if comfort measures only is desired then pulmonary would be very supportive of that.  Otherwise we encourage above listed measures.  s  My cc time 35 minutes  Roselie Awkward, MD [place] [facility] Pager: [phone] Cell: [phone] After 7pm: [phone]

## 2022-06-29 NOTE — Progress Notes (Addendum)
[facility] Progress Note   Subjective:  Seen in room - hospitalist at bed side. Has been extubated. Breathing more labored today, but apparently is O2 had fallen off. Plan for neb treatment and see how does after HD. Says he feels a little better with O2 replaced/increased.   Objective Vitals:   06/29/22 0353 06/29/22 0419 06/29/22 0741 06/29/22 1150  BP:  (!) 108/57 (!) 112/52 (!) 123/48  Pulse: (!) 5 77 78 80  Resp:  20 (!) 24 (!) 25  Temp:  97.9 F (36.6 C) 98.5 F (36.9 C) 97.8 F (36.6 C)  TempSrc:  Axillary Axillary Axillary  SpO2: 96% 96% 96% 97%  Weight:      Height:       Physical Exam General: Chronically ill appearing man, + WOB. O2 in place.  NG tube in place. Heart: RRR; no murmur Lungs: Mild tachypnea, no wheezing Abdomen: soft Extremities: no LE edema Dialysis Access:  LUE AVF + thrill  Additional Objective Labs: Basic Metabolic Panel: Recent Labs  Lab 06/27/22 0121 06/28/22 0149 06/29/22 0208  NA 135 136 138  K 4.1 4.3 4.3  CL 99 101 102  CO2 '24 22 24  '$ GLUCOSE 434* 172* 99  BUN 35* 51* 64*  CREATININE 2.18* 2.92* 3.60*  CALCIUM 8.2* 8.9 8.7*  PHOS 4.6 4.6 5.1*   Liver Function Tests: Recent Labs  Lab 06/28/22 0146 06/28/22 0149 06/29/22 0207 06/29/22 0208  AST 81*  --  168*  --   ALT 63*  --  105*  --   ALKPHOS 416*  --  466*  --   BILITOT 0.7  --  0.9  --   PROT 5.3*  --  5.3*  --   ALBUMIN 2.0* 2.0* 1.9* 1.8*   CBC: Recent Labs  Lab 06/23/22 0818 06/23/22 0926 07/01/2022 0920 06/26/22 0221 06/27/22 0121 06/28/22 0146 06/29/22 0207  WBC 18.8*   < > 7.2 13.7* 11.3* 14.2* 8.7  NEUTROABS 18.2*  --  6.9  --   --   --   --   HGB 7.0*   < > 8.5* 8.3* 8.4* 8.5* 8.6*  HCT 21.9*   < > 25.0* 25.5* 27.4* 26.8* 28.5*  MCV 96.9   < > 95.4 95.5 99.6 97.5 100.4*  PLT 130*   < > 53* 95* 102* 106* 95*   < > = values in this interval not displayed.   Studies/Results:  Medications:  sodium chloride Stopped (06/23/22 0834)    anticoagulant sodium citrate     feeding supplement (VITAL 1.5 CAL) 1,000 mL (06/29/22 1122)   thiamine (VITAMIN B1) injection 500 mg (06/29/22 0076)   Followed by   Derrill Memo ON 2022-07-05] thiamine (VITAMIN B1) injection      allopurinol  100 mg Per Tube Q M,W,F-2000   [START ON 2022-07-05] darbepoetin (ARANESP) injection - DIALYSIS  200 mcg Subcutaneous Q Tue-1800   feeding supplement (PROSource TF20)  60 mL Per Tube Daily   insulin aspart  0-15 Units Subcutaneous Q4H   ipratropium-albuterol  3 mL Nebulization Q6H WA   iron polysaccharides  150 mg Per Tube Daily   lactulose  30 g Per Tube TID   metoprolol tartrate  12.5 mg Per Tube BID   mirtazapine  15 mg Oral QHS   multivitamin  1 tablet Per Tube QHS   pantoprazole  40 mg Intravenous Q12H   pravastatin  20 mg Per Tube q AM   rifaximin  550 mg Per Tube BID  sulfamethoxazole-trimethoprim  1 tablet Per Tube Weekly   [START ON 07/05/2022] thiamine  100 mg Oral Daily   venlafaxine  75 mg Per Tube BID WC    Dialysis Orders: [facility] MWF  4h  80.5kg -> weight now around 65-68kg, 350/500  2/2.5 bath  AVF  Hep none  - Mircera 265mg q 2 weeks (due 1/15)   Assessment/Plan: Dyspnea/respiratory distress - secondary to volume overload/Influenza A. Initially requiring BiPAP on admit, then improved, then decompensated again and required intubation on 1/22, now extubated. CXR changes from admit (CHF and/or viral pneumonitis) have resolved. ^ WOB today, O2 titrated and coming for HD shortly. Will be on BiPAP nightly and O2 during the day. Encephalopathy - hepatic / metabolic cause, lactulose/rifaximin per primary.  ESRD -  Continue HD on MWF schedule - for HD today. BP/volume: Hypotension/shock on admit, now improved. Well below prior EDW, will lower on discharge Afib with RVR - on amio, no a/c w/ possible GI bleed Possible GI bleed - s/p octreotide, remains on PPI BID Anemia of ESRD: Hgb low/stable, continue Aranesp 2063m weekly (q Tues).  On "PO" iron. Metabolic bone disease -  Ca and phos in goal. No VDRA as outpatient  Nutrition - Alb very low, now on TF via NG tube - failed swallow study.  Cirrhosis with ascites - per primary team, s/p paracentesis with 2.3L 06/19/22 H/o etoh abuse Flu A - completed course of Tamiflu  DNR    KaVeneta PentonPA-C 06/29/2022, 12:25 PM  [facility] attending: The patient was seen and examined.  Chart reviewed.  I agree with assessment and plan as outlined above. Mild increased work of breathing, plan for dialysis today.  Currently on Aranesp for anemia.  Continue current management.  Patient is DNR.  I discussed with the patient's wife who was talking with him on FaceTime. D.Katheran JamesCaSt. Martinidney Associates.

## 2022-06-30 ENCOUNTER — Ambulatory Visit: Payer: Self-pay | Admitting: Gastroenterology

## 2022-06-30 DIAGNOSIS — Z992 Dependence on renal dialysis: Secondary | ICD-10-CM | POA: Diagnosis not present

## 2022-06-30 DIAGNOSIS — J9621 Acute and chronic respiratory failure with hypoxia: Secondary | ICD-10-CM | POA: Diagnosis not present

## 2022-06-30 DIAGNOSIS — K7682 Hepatic encephalopathy: Secondary | ICD-10-CM | POA: Diagnosis not present

## 2022-06-30 DIAGNOSIS — N186 End stage renal disease: Secondary | ICD-10-CM | POA: Diagnosis not present

## 2022-06-30 LAB — GLUCOSE, CAPILLARY
Glucose-Capillary: 120 mg/dL — ABNORMAL HIGH (ref 70–99)
Glucose-Capillary: 129 mg/dL — ABNORMAL HIGH (ref 70–99)
Glucose-Capillary: 129 mg/dL — ABNORMAL HIGH (ref 70–99)
Glucose-Capillary: 140 mg/dL — ABNORMAL HIGH (ref 70–99)
Glucose-Capillary: 145 mg/dL — ABNORMAL HIGH (ref 70–99)
Glucose-Capillary: 196 mg/dL — ABNORMAL HIGH (ref 70–99)
Glucose-Capillary: 242 mg/dL — ABNORMAL HIGH (ref 70–99)

## 2022-06-30 LAB — CBC
HCT: 26.8 % — ABNORMAL LOW (ref 39.0–52.0)
Hemoglobin: 8 g/dL — ABNORMAL LOW (ref 13.0–17.0)
MCH: 30.5 pg (ref 26.0–34.0)
MCHC: 29.9 g/dL — ABNORMAL LOW (ref 30.0–36.0)
MCV: 102.3 fL — ABNORMAL HIGH (ref 80.0–100.0)
Platelets: 99 10*3/uL — ABNORMAL LOW (ref 150–400)
RBC: 2.62 MIL/uL — ABNORMAL LOW (ref 4.22–5.81)
RDW: 21.7 % — ABNORMAL HIGH (ref 11.5–15.5)
WBC: 10.2 10*3/uL (ref 4.0–10.5)
nRBC: 0 % (ref 0.0–0.2)

## 2022-06-30 LAB — RENAL FUNCTION PANEL
Albumin: 2.2 g/dL — ABNORMAL LOW (ref 3.5–5.0)
Anion gap: 8 (ref 5–15)
BUN: 48 mg/dL — ABNORMAL HIGH (ref 8–23)
CO2: 29 mmol/L (ref 22–32)
Calcium: 8.7 mg/dL — ABNORMAL LOW (ref 8.9–10.3)
Chloride: 100 mmol/L (ref 98–111)
Creatinine, Ser: 3 mg/dL — ABNORMAL HIGH (ref 0.61–1.24)
GFR, Estimated: 21 mL/min — ABNORMAL LOW (ref >60)
Glucose, Bld: 130 mg/dL — ABNORMAL HIGH (ref 70–99)
Phosphorus: 4 mg/dL (ref 2.5–4.6)
Potassium: 3.7 mmol/L (ref 3.5–5.1)
Sodium: 137 mmol/L (ref 135–145)

## 2022-06-30 LAB — BLOOD GAS, VENOUS
Acid-Base Excess: 0 mmol/L (ref 0.0–2.0)
Bicarbonate: 28.9 mmol/L — ABNORMAL HIGH (ref 20.0–28.0)
Drawn by: 6056
O2 Saturation: 74.2 %
Patient temperature: 36.5
pCO2, Ven: 65 mmHg — ABNORMAL HIGH (ref 44–60)
pH, Ven: 7.26 (ref 7.25–7.43)
pO2, Ven: 43 mmHg (ref 32–45)

## 2022-06-30 MED ORDER — HEPARIN SODIUM (PORCINE) 5000 UNIT/ML IJ SOLN: 5000.0000 [IU] | Freq: Three times a day (TID) | INTRAMUSCULAR | Status: DC

## 2022-07-02 ENCOUNTER — Telehealth: Payer: Self-pay | Admitting: Internal Medicine

## 2022-07-02 NOTE — Progress Notes (Addendum)
\ PROGRESS NOTE    Jeffrey [name]  ZYY:[id number] DOB: 08-Jun-1945 DOA: 06/29/2022 PCP: Monico Blitz, MD  Chief Complaint  Patient presents with   Respiratory Distress    Brief Narrative:  77 yo M PMH ESRD on MWF HD, alcoholic cirrhosis, DM2, Chronic hypoxic respiratory failure on 2L, Afib, cirrhosis was admitted 1/15 for acute on chronic hypoxic resp failure, Flu PNA.   On  1/22 he was more lethargic. While in iHD he was started on BiPAP but remained lethargic.  ABG with resp acidosis    Significant events 1/15 admit to [place] from [facility] for iHD need 1/15 nephro consult, HD  1/16 Flu Magon Croson pos, tamiflu  1/18 para, no SBP  1/19 steroids added for incr O2 need  1/22 PCCM consulted for AMS hypercarbic resp failure, SOB. Transfer to ICU for intubation  1/27 TRH assumed care   Assessment & Plan:   Principal Problem:   Acute on chronic respiratory failure with hypoxia (HCC) Active Problems:   Influenza Jeffrey [name]   Acute hepatic encephalopathy (HCC)   ESRD on dialysis ([place])   Alcoholic cirrhosis of liver with ascites ([place])   Esophageal varices in alcoholic cirrhosis ([place])   Acute hyponatremia   Controlled type 2 diabetes mellitus with chronic kidney disease on chronic dialysis, with long-term current use of insulin (HCC)   Protein-calorie malnutrition, severe  Goals of care Appreciate palliative care assistance.  For now, planning to see how he does with nightly bipap and dialysis.  His mental status has waxed and waned, but has generally been somewhat lethargic most days.  He's very hoarse after being intubated which limits communication with him to some degree.  Will see how he does with nightly bipap.  If improving, can continue that and pursue outpatient.  I'm concerned that he may not improve to that point and discussed this with his wife, sister, daughter today.  They're understanding of the potential need to consider hospice/comfort measures in that scenario.  Planning to see how he does day to  day.  Acute metabolic encephalopathy  Hepatic Encephalopathy Multifactorial.  Primarily driven by hypercarbia at this time.  Hepatic encephalopathy, uremia other contributing causes.   Nightly bipap Bipap ordered for now with continued resp acidosis  Lactulose, rifaximin Minimize sedating meds - taper remeron and effexor as well to avoid withdrawal (discussed with pharmacy).  Remove prn xanax from med list. High dose thiamine Follow repeat ammonia levels (normalized)  Dialysis per renal    Acute on chronic respiratory failure with hypoxia and hypercapnia Volume Overload  Pulmonary Edema  Bilateral Pulmonary Effusions Influenza Jeffrey [name] PNA -currently on 4-8 L -CXR 1/29 with bibasilar effusions, pulmonary edema (volume per renal - dialysis cut short yesterday due to hypotension)  -scheduled nebs -completed tamiflu  -No indication for antibiotic or steroid -needs nightly bipap (and when he's sleeping during the day) - pulm suspects hypercarbic resp failure due to multiple comorbid illnesses and profound physical deconditioning rather than Jeffrey [name] primary pulmonary process   Dysphagia With NG tube for meds/tube feeds SLP recommending NPO Hold when on bipap - becoming an issue since he's on bipap nightly and has also needed during the day.  Appreciate RD assistance. 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  He only  has grade 1 varices.  He had Jeffrey [name] colonoscopy in 12/2018 through which had Jeffrey [name] possible bleeding polypoid lesion in the splenic flexure. Hb down to 5.6 on 1/24, improved to 8 after 2 units pRBC Relatively stable since around 8.4 GI was consulted 1/25, they're planning to defer EGD if no evidence active GI bleed, recent endoscopic  eval within 6 months. -Continue PPI 40 mg twice daily -iron def and chronic disease    Shock of multifactorial causes-resolved -Discontinue midodrine   Thrombocytopenia His baseline platelets around 100s. 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  He has other explanation for thrombocytopenia including cirrhosis, ESRD and acute illness. Platelet trending upward to 106.  Peripheral blood smear unremarkable, no schistocytes to suspect DIC. -Pending HIT antibody results -> wnl -SCD for DVT proph.  Platelets relatively stable, follow closely with heparin.   Jeffrey [name]-fib with RVR -Amiodarone 100 mg daily -He is not on anticoagulation at home. Could consider eliquis.  Per critical care note, his HAS-BLED score is 5 indicates high risk of bleeding. This will be Jeffrey [name] risk vs benefit conversation with family.  Will hold anticoagulation for now.  Can address further based on his course.   ESRD on HD MWF -Nephrology is following   Decompensated alcoholic cirrhosis with ascites with esophageal varices and encephalopathy Patient had paracentesis done on 1/18, which was negative for SBP Continue lactulose, rifaximin, inderol   Bactrim for SBP ppx    Hypervolemic hyponatremia Monitor electrolytes   DM2 Will adjust insulin regimen as able     DVT prophylaxis: SCD - heparin  Code Status: DNR -> ok with intubation, mechanical ventilation, cardioversion  Family Communication: wife Disposition:   Status is: Inpatient Remains inpatient appropriate because: continued need for inpatient care   Consultants:  PCCM Renal  GI  Procedures:  none  Antimicrobials:  Anti-infectives (From admission, onward)    Start     Dose/Rate Route Frequency Ordered Stop   06/26/22 2200  sulfamethoxazole-trimethoprim (BACTRIM DS) 800-160 MG per tablet 1 tablet  Status:  Discontinued        1 tablet Oral Weekly 06/26/22 1022 06/26/22 1024   06/26/22 2200  sulfamethoxazole-trimethoprim (BACTRIM DS) 800-160 MG per tablet 1  tablet        1 tablet Per Tube Weekly 06/26/22 1024     06/26/22 1600  sulfamethoxazole-trimethoprim (BACTRIM DS) 800-160 MG per tablet 1 tablet  Status:  Discontinued       Note to Pharmacy: TAKE 1 TABLET BY MOUTH every friday AFTER dialysis     1 tablet Per Tube Weekly 06/22/22 1828 06/24/22 1533   06/26/22 1015  rifaximin (XIFAXAN) tablet 550 mg        550 mg Per Tube 2 times daily 06/26/22 1013     06/26/22 1000  cefTRIAXone (ROCEPHIN) 1 g in sodium chloride 0.9 % 100 mL IVPB  Status:  Discontinued        1 g 200 mL/hr over 30 Minutes Intravenous Every 24 hours 06/24/22 1533 06/26/22 1022   06/14/2022 2200  rifaximin (XIFAXAN) tablet 550 mg  Status:  Discontinued        550 mg Oral 2 times daily 06/14/2022 1344 06/26/22 1013   06/22/22 2200  rifaximin (XIFAXAN) tablet 550 mg  Status:  Discontinued        550 mg Per Tube 2 times daily 06/22/22 1828 06/22/2022 1344   06/19/22 1600  sulfamethoxazole-trimethoprim (BACTRIM DS) 800-160 MG per tablet 1 tablet  Status:  Discontinued  Note to Pharmacy: TAKE 1 TABLET BY MOUTH every friday AFTER dialysis     1 tablet Oral Weekly 06/16/2022 1534 06/22/22 1828   06/17/22 1800  oseltamivir (TAMIFLU) capsule 30 mg        30 mg Oral Every M-W-F-Sa (1800) 06/17/22 1327 06/19/22 1718   06/18/2022 1845  oseltamivir (TAMIFLU) capsule 30 mg  Status:  Discontinued        30 mg Oral Daily 06/14/2022 1826 06/17/22 1327   07/01/2022 1545  rifaximin (XIFAXAN) tablet 550 mg  Status:  Discontinued        550 mg Oral 2 times daily 06/14/2022 1534 06/22/22 1828       Subjective: No complaints other than it feeling warm in the room Mouths words mostly   Objective: Vitals:   Jul 14, 2022 1640 07/14/22 1641 Jul 14, 2022 1654 Jul 14, 2022 1702  BP:    (!) 116/45  Pulse:    83  Resp:      Temp:      TempSrc:      SpO2: (!) 88% 90% 98% 96%  Weight:      Height:        Intake/Output Summary (Last 24 hours) at 07-14-22 1902 Last data filed at 07/14/2022 0235 Gross per 24 hour   Intake 3860.43 ml  Output --  Net 3860.43 ml    Filed Weights   06/27/22 0458 06/29/22 1336 06/29/22 1625  Weight: 68.7 kg 70.6 kg 71.2 kg    Examination:  General: No acute distress. Cardiovascular: RRR Lungs: unlabored, diminished Abdomen: Soft, nontender, nondistended Neurological: Alert, mouths some words, follows commands, but seems Smitty Ackerley bit lethargic (doesn't look as good and alert as he was on bipap last night) - communication limited by hoarseness Extremities: No clubbing or cyanosis. No edema.   Data Reviewed: I have personally reviewed following labs and imaging studies  CBC: Recent Labs  Lab 06/29/2022 0920 06/26/22 0221 06/27/22 0121 06/28/22 0146 06/29/22 0207 Jul 14, 2022 1255  WBC 7.2 13.7* 11.3* 14.2* 8.7 10.2  NEUTROABS 6.9  --   --   --   --   --   HGB 8.5* 8.3* 8.4* 8.5* 8.6* 8.0*  HCT 25.0* 25.5* 27.4* 26.8* 28.5* 26.8*  MCV 95.4 95.5 99.6 97.5 100.4* 102.3*  PLT 53* 95* 102* 106* 95* 99*    Basic Metabolic Panel: Recent Labs  Lab 06/24/22 1640 06/05/2022 0440 06/26/22 0221 06/27/22 0121 06/28/22 0146 06/28/22 0149 06/29/22 0207 06/29/22 0208 Jul 14, 2022 0346  NA  --  131* 131* 135  --  136  --  138 137  K  --  3.5 3.6 4.1  --  4.3  --  4.3 3.7  CL  --  95* 97* 99  --  101  --  102 100  CO2  --  '26 26 24  '$ --  22  --  24 29  GLUCOSE  --  187* 51* 434*  --  172*  --  99 130*  BUN  --  28* 43* 35*  --  51*  --  64* 48*  CREATININE  --  1.79* 2.53* 2.18*  --  2.92*  --  3.60* 3.00*  CALCIUM  --  7.8* 8.1* 8.2*  --  8.9  --  8.7* 8.7*  MG 1.5* 2.3  --  2.0 2.1  --  2.3  --   --   PHOS 1.1* 2.0* 3.7 4.6  --  4.6  --  5.1* 4.0    GFR: Estimated Creatinine Clearance: 19.9 mL/min (  Nevena Rozenberg) (by C-G formula based on SCr of 3 mg/dL (H)).  Liver Function Tests: Recent Labs  Lab 06/28/22 0146 06/28/22 0149 06/29/22 0207 06/29/22 0208 2022-07-19 0346  AST 81*  --  168*  --   --   ALT 63*  --  105*  --   --   ALKPHOS 416*  --  466*  --   --   BILITOT 0.7   --  0.9  --   --   PROT 5.3*  --  5.3*  --   --   ALBUMIN 2.0* 2.0* 1.9* 1.8* 2.2*    CBG: Recent Labs  Lab 2022/07/19 0439 2022/07/19 0758 July 19, 2022 1012 07-19-2022 1238 2022-07-19 1635  GLUCAP 120* 129* 129* 140* 145*     No results found for this or any previous visit (from the past 240 hour(s)).        Radiology Studies: DG CHEST PORT 1 VIEW  Result Date: 06/29/2022 CLINICAL DATA:  Shortness of breath, respiratory distress, history anemia, GERD, hypertension EXAM: PORTABLE CHEST 1 VIEW COMPARISON:  Portable exam 1251 hours compared to 06/27/2022 FINDINGS: Enlargement of cardiac silhouette with pulmonary vascular congestion. Mediastinal contours normal. Bibasilar effusions and atelectasis. Perihilar to basilar infiltrates, may represent minimal pulmonary edema IMPRESSION: Enlargement of cardiac silhouette with pulmonary vascular congestion. Bibasilar effusions and atelectasis with mild perihilar to basilar infiltrates favoring pulmonary edema. Electronically Signed   By: Lavonia Dana M.D.   On: 06/29/2022 13:08   DG Swallowing Func-Speech Pathology  Result Date: 06/29/2022 Table formatting from the original result was not included. Images from the original result were not included. Objective Swallowing Evaluation: Type of Study: MBS-Modified Barium Swallow Study  Patient Details Name: JIBREEL FEDEWA MRN: [id number] Date of Birth: 1945/12/08 Today's Date: 06/29/2022 Time: SLP Start Time (ACUTE ONLY): 0830 -SLP Stop Time (ACUTE ONLY): 2956 SLP Time Calculation (min) (ACUTE ONLY): 8 min Past Medical History: Past Medical History: Diagnosis Date  Salimata Christenson-fib [facility])   when initially stating dialysis january 2021 at [facility] per spouse; never heard anything else about it  Alcoholic cirrhosis ([place])   patient reports completing hep Joy Haegele and b vaccines in 2006  Anemia   has had 3 units of prbcs 2013  Anxiety   Arthritis   Asymptomatic gallstones   Ultrasound in 2006  B12 deficiency 11/21/2020  C. difficile  colitis   Cholelithiasis   Chronic kidney disease   Dialysis M/W/F/Sa  Depression   Diabetes ([place])   Duodenal ulcer with hemorrhage   per patient in 2006 or 2007, records have been requested  Dyspnea   low oxygen level -  has O2 2 L all day  Esophageal varices ([place])   see P[facility]  GERD (gastroesophageal reflux disease)   Heart burn   History of alcohol abuse   quit 05/2013  HOH (hard of hearing)   Hypertension   Liver cirrhosis ([place])   ETOH  SBP (spontaneous bacterial peritonitis) ([place])   2020, November 2021.   Splenomegaly   Ultrasound in 2006 Past Surgical History: Past Surgical History: Procedure Laterality Date  AGILE CAPSULE N/Colbie Sliker 09/20/2019  Procedure: AGILE CAPSULE;  Surgeon: Daneil Dolin, MD;  [facility];  Service: Endoscopy;  Laterality: N/Clydie Dillen;  APPENDECTOMY    AV FISTULA PLACEMENT Left 08/17/2019  Procedure: ARTERIOVENOUS (AV) FISTULA CREATION VERSES ARTERIOVENOUS GRAFT;  Surgeon: [name], MD;  Location: [facility] OR;  Service: Vascular;  Laterality: Left;  [place] Left 09/28/2019  Procedure: LEFT SECOND STAGE [place]  TRANSPOSITION;  Surgeon: [name], MD;  Location: [place];  Service: Vascular;  Laterality: Left;  BIOPSY  02/03/2018  Procedure: BIOPSY;  Surgeon: Daneil Dolin, MD;  [facility];  Service: Endoscopy;;  bx of gastric polyps  BIOPSY  09/19/2019  Procedure: BIOPSY;  Surgeon: Danie Binder, MD;  [facility];  Service: Endoscopy;;  BIOPSY  01/16/2021  Procedure: BIOPSY;  Surgeon: Daneil Dolin, MD;  [facility];  Service: Endoscopy;;  CATARACT EXTRACTION Right   COLONOSCOPY  03/10/2005  Rectal polyp as described above, removed with snare. Left sided  diverticula. The remainder of the colonic mucosa appeared normal. Inflammed polyp on path.  COLONOSCOPY  04/1999  Dr. Thea Silversmith polyps removed,  Path showed hyperplastic  COLONOSCOPY  10/2015  Dr. Britta Mccreedy: diverticulosis, single sessile tubular adenoma 3-55m in size  removed from descending colon.   COLONOSCOPY WITH ESOPHAGOGASTRODUODENOSCOPY (EGD)  03/31/2012  RNOM:VEHMCNOAVMs. Colonic diverticulosis. tubular adenoma colon  COLONOSCOPY WITH ESOPHAGOGASTRODUODENOSCOPY (EGD)  06/2019  FORSYTH: small esophageal varices without high risk stigmata, single large AVM on lesser curvature in gastric body s/p APC ablation, gastric antral and duodenal bulb polyposis. No significant source to explain transfusion dependent anemia. Colonoscopy with portal colopathy and diffuse edema, changes of Cdiff colitis on colonoscopy  COLONOSCOPY WITH PROPOFOL N/Markeem Noreen 01/19/2021  Surgeon: CDaryel November MD; 15 mm polyp in descending colon removed piecemeal, 6 mm polyp in ascending colon and 12 mm polyp in sigmoid colon not removed due to high bleeding risk, benign polypoid lesion at splenic flexure, diverticulosis in sigmoid and descending colon, rectal varices.  COLONOSCOPY WITH PROPOFOL N/Aneth Schlagel 01/16/2021  Surgeon: RDaneil Dolin MD;  Portal colopathy, large rectal varices, polypoid mass at splenic flexure biopsy, two 1 cm left colon polyps not removed, left-sided diverticulosis.  Reported patient had bleeding from splenic flexure lesion.  Pathology with acutely inflamed colonic mucosa with localized ulceration and prominent reactive changes.  ESOPHAGEAL BANDING N/Declynn Lopresti 02/03/2018  Procedure: ESOPHAGEAL BANDING;  Surgeon: RDaneil Dolin MD;  [facility];  Service: Endoscopy;  Laterality: N/Brielle Moro;  ESOPHAGEAL BANDING  01/16/2021  Procedure: ESOPHAGEAL BANDING;  Surgeon: RDaneil Dolin MD;  [facility];  Service: Endoscopy;;  ESOPHAGOGASTRODUODENOSCOPY  01/15/2005  Three columns grade 1 to 2 esophageal varices, otherwise normal esophageal mucosa.  Esophagus was not manipulated otherwise./Nodularity of the antrum with overlying erosions, nonspecific finding. Path showed rare H.pylori  ESOPHAGOGASTRODUODENOSCOPY  09/2008  Dr. RGaylene Brookscolumns of grade 2-3 esoph varices, only one  column was prominent. Portal gastropathy, multiple gastrc polyps at antrum, two were 2cm with black eschar, bulbar polyps, bulbar erosions  ESOPHAGOGASTRODUODENOSCOPY  11/2004  Dr. FBrantley Stage3 esoph varices  ESOPHAGOGASTRODUODENOSCOPY  03/31/2012  RMR: 4 columns(3-GR 2, 1-GR1) non-bleeding esophageal varices, portal gastropathy, small HH, early GAVE, multiple gastric polyps   ESOPHAGOGASTRODUODENOSCOPY (EGD) WITH PROPOFOL N/Fadi Menter 02/03/2018  Dr. RGala Romney Esophageal varices, 3 columns grade 1-2.  Portal hypertensive gastropathy.  Multiple gastric polyps, biopsy consistent with hyperplastic.  ESOPHAGOGASTRODUODENOSCOPY (EGD) WITH PROPOFOL N/Barbette Mcglaun 09/19/2019  Fields: grade I and II esophageal varcies, mild portal hypertensive gastropathy, moderate gastritis but no H. pylori, single hyperplastic gastric polyp removed, obvious source for melena/transfusion dependent anemia not identified, may be due to friable gastric and duodenal mucosa in the setting of portal hypertension  ESOPHAGOGASTRODUODENOSCOPY (EGD) WITH PROPOFOL N/Adamarie Izzo 01/16/2021  Surgeon: RDaneil Dolin MD; Portal hypertensive gastropathy, grade 2 esophageal varices with bleeding stigmata s/p banding x4, slight nodularity and congestion involving bulb  and second portion of duodenum, nonspecific.  Recommended repeat EGD in 4 weeks.  ESOPHAGOGASTRODUODENOSCOPY (EGD) WITH PROPOFOL N/Kosisochukwu Goldberg 05/22/2021  stigmata of prior esophageal banding and no significant varices seen. Portal gastropathy. Polypoid gastric and duodenal mucosa with overlying erosions, marked mucosal friability.  ESOPHAGOGASTRODUODENOSCOPY (EGD) WITH PROPOFOL N/Gerre Ranum 02/06/2022  Procedure: ESOPHAGOGASTRODUODENOSCOPY (EGD) WITH PROPOFOL;  Surgeon: Harvel Quale, MD;  [facility];  Service: Gastroenterology;  Laterality: N/Delia Slatten;  GASTRIC VARICES BANDING  03/31/2012  Procedure: GASTRIC VARICES BANDING;  Surgeon: Daneil Dolin, MD;  [facility];  Service: Endoscopy;  Laterality:  N/Clinton Wahlberg;  GIVENS CAPSULE STUDY N/Kalyn Hofstra 09/21/2019  Poor study with Issachar Broady lot of debris obstructing much of the view of the first approximate 3 hours out of 6 hours.  No obvious source of bleeding identified.  Sequela of bleeding and old blood seen in the form of"whisps" of blood, blood flecks mostly toward the end of the study.  HEMOSTASIS CLIP PLACEMENT  01/19/2021  Procedure: HEMOSTASIS CLIP PLACEMENT;  Surgeon: [name], MD;  Location: [facility] ENDOSCOPY;  Service: Gastroenterology;;  HEMOSTASIS CLIP PLACEMENT  02/06/2022  Procedure: HEMOSTASIS CLIP PLACEMENT;  Surgeon: Montez Morita, Quillian Quince, MD;  [facility];  Service: Gastroenterology;;  gastric body  HOT HEMOSTASIS  02/06/2022  Procedure: HOT HEMOSTASIS (ARGON PLASMA COAGULATION/BICAP);  Surgeon: Montez Morita, Quillian Quince, MD;  [facility];  Service: Gastroenterology;;  IR PARACENTESIS  06/18/2022  IR REMOVAL TUN CV CATH W/O FL  02/15/2020  POLYPECTOMY  09/19/2019  Procedure: POLYPECTOMY;  Surgeon: Danie Binder, MD;  [facility];  Service: Endoscopy;;  POLYPECTOMY  01/19/2021  Procedure: POLYPECTOMY;  Surgeon: [name], MD;  Location: [place];  Service: Gastroenterology;;  UMBILICAL HERNIA REPAIR  2017  exploratory laparotomy, abdominal washout HPI: 78 yo male admitted 06/22/2022 due to SOB/acute respiratory distress found to be positive for flu.1/22 respiratory distress and hypotensions during HD requiring intubation. ETT 1/22-25. PMH: DM, HTN, GIB, chronic resp failure on 2 L, Afib, ESRD on HD MWF, cirrhosis requiring periodic paracentesis.  Subjective: alert  Recommendations for follow up therapy are one component of Brigit Doke multi-disciplinary discharge planning process, led by the attending physician.  Recommendations may be updated based on patient status, additional functional criteria and insurance authorization. Assessment / Plan / Recommendation   06/29/2022  10:30 AM Clinical Impressions Clinical Impression Pt  exhibits moderate oral and mod-severe pharyngeal dysphagia. Oral phase noted for reduced lingual to palatal seal, delayed and inefficient propulsion with decreased cohesion of bolus. Pt had laryngeal penetration and aspiration with all consistencies attempted (thin, nectar and puree) with delayed initition, decreased laryngeal elevation and closure, reduced tongue base retraction and epiglottic deflection. There is also reduced pharyngoesophageal segment distention causing reduced transit into esophagus material collecting above the PES. Pt was cued to produce multiple swallows however this resulted in further penetration and aspiration with each attempt. He was prompted for throat clear which he did without clearance and he was unable to produce strong cough (is currently aphonic). Recommend he continue with NPO status (has Cortrak) and ST will continue to work with pt moving toward po recommendation. Called wife with results and she denies dysphagia prior to this admission.     06/17/2022   3:00 PM Treatment Recommendations Treatment Recommendations Therapy as outlined in treatment plan below     06/13/2022   3:00 PM Prognosis Prognosis for Safe Diet Advancement Good    No data to display  06/29/2022   8:45 AM Other Recommendations Follow Up Recommendations --   06/10/2022   3:00 PM Frequency and Duration  Speech Therapy Frequency (ACUTE ONLY) min 2x/week Treatment Duration 2 weeks     06/29/2022  10:30 AM Oral Phase Oral Phase Impaired Oral - Nectar Teaspoon Weak lingual manipulation;Incomplete tongue to palate contact;Reduced posterior propulsion;Decreased bolus cohesion Oral - Thin Teaspoon Weak lingual manipulation;Incomplete tongue to palate contact;Reduced posterior propulsion;Decreased bolus cohesion Oral - Puree Weak lingual manipulation;Incomplete tongue to palate contact;Reduced posterior propulsion;Decreased bolus cohesion    06/29/2022  10:30 AM Pharyngeal Phase Pharyngeal Phase Impaired Pharyngeal-  Nectar Teaspoon Delayed swallow initiation-vallecula;Penetration/Aspiration during swallow;Pharyngeal residue - valleculae;Pharyngeal residue - pyriform;Reduced tongue base retraction;Reduced airway/laryngeal closure;Reduced laryngeal elevation;Reduced anterior laryngeal mobility;Reduced epiglottic inversion Pharyngeal Material enters airway, passes BELOW cords without attempt by patient to eject out (silent aspiration) Pharyngeal- Thin Teaspoon Penetration/Aspiration during swallow;Penetration/Apiration after swallow;Pharyngeal residue - valleculae;Pharyngeal residue - pyriform;Reduced epiglottic inversion;Reduced anterior laryngeal mobility;Reduced laryngeal elevation;Reduced airway/laryngeal closure;Reduced tongue base retraction;Delayed swallow initiation-vallecula Pharyngeal Material enters airway, passes BELOW cords without attempt by patient to eject out (silent aspiration);Material enters airway, CONTACTS cords and not ejected out Pharyngeal- Puree Penetration/Aspiration during swallow;Pharyngeal residue - valleculae;Pharyngeal residue - pyriform;Reduced airway/laryngeal closure;Reduced laryngeal elevation;Reduced anterior laryngeal mobility;Reduced epiglottic inversion;Delayed swallow initiation-vallecula Pharyngeal Material enters airway, passes BELOW cords without attempt by patient to eject out (silent aspiration)    06/29/2022  10:30 AM Cervical Esophageal Phase  Cervical Esophageal Phase Impaired [place] Siren 06/29/2022, 10:45 AM                          Scheduled Meds:  allopurinol  100 mg Per Tube Q M,W,F-2000   darbepoetin (ARANESP) injection - DIALYSIS  200 mcg Subcutaneous Q Tue-1800   feeding supplement (PROSource TF20)  60 mL Per Tube Daily   feeding supplement (VITAL 1.5 CAL)  1,000 mL Per Tube Q24H   insulin aspart  0-15 Units Subcutaneous Q4H   ipratropium-albuterol  3 mL Nebulization Q6H WA   iron polysaccharides  150 mg Per Tube Daily   lactulose  30 g Per Tube TID    metoprolol tartrate  12.5 mg Per Tube BID   mirtazapine  15 mg Oral QHS   multivitamin  1 tablet Per Tube QHS   pantoprazole  40 mg Intravenous Q12H   pravastatin  20 mg Per Tube q AM   rifaximin  550 mg Per Tube BID   sulfamethoxazole-trimethoprim  1 tablet Per Tube Weekly   [START ON 07/05/2022] thiamine  100 mg Oral Daily   venlafaxine  75 mg Per Tube BID WC   Continuous Infusions:  sodium chloride Stopped (06/23/22 0834)   thiamine (VITAMIN B1) injection       LOS: 15 days    Time spent: over 30 min    Fayrene Helper, MD [facility]   To contact the attending provider between 7A-7P or the covering provider during after hours 7P-7A, please log into the web site [email or web address] and access using universal [place] password for that web site. If you do not have the password, please call the hospital operator.  07/27/22, 7:02 PM

## 2022-07-02 NOTE — Telephone Encounter (Signed)
Pt is deceased. 

## 2022-07-02 NOTE — Consult Note (Signed)
Consultation Note Date: 07/22/2022   Patient Name: Jeffrey [name]  DOB: [birth year]  MRN: [id number]  Age / Sex: 78 y.o., male  PCP: Monico Blitz, MD Referring Physician: Elodia Florence., *  Reason for Consultation: Establishing goals of care and Psychosocial/spiritual support  HPI/Patient Profile: 77 y.o. male  with past medical history of *** admitted on 06/18/2022 with ***.  with past medical history of diabetes mellitus, hypertension, GI bleed, chronic respiratory failure on 2 L nasal cannula, atrial fibrillation and cirrhosis who had presented to [facility] on 1/14 for scheduled paracentesis. However, patient unable to get procedure done as he was complaining of shortness of breath and unable to lie flat. He has been complaining of shortness of breath and cough x 2 days. Workup revealed patient in acute respiratory distress requiring BiPAP. Patient positive for flu. Patient only has dialysis on Monday/Wednesday/Friday and because no dialysis available at [facility], patient transferred to [facility] ER due to lack of available beds on floor. By time of arrival to emergency department here, patient down to 5 L nasal cannula. Nephrology consulted and plan to take patient for dialysis tonight.   1/15 admit to [place] from [facility] for iHD need 1/15 nephro consult, HD  1/16 Flu A pos, tamiflu  1/18 para, no SBP  1/19 steroids added for incr O2 need  1/22 PCCM consulted for AMS hypercarbic resp failure, SOB. Transfer to ICU for intubation      Clinical Assessment and Goals of Care:  This NP Wadie Lessen reviewed medical records, received report from team, assessed the patient and then meet at the patient's bedside  to discuss diagnosis, prognosis, GOC, EOL wishes disposition and options.   Concept of Palliative Care was introduced as specialized medical care for people and their families living  with serious illness.  If focuses on providing relief from the symptoms and stress of a serious illness.  The goal is to improve quality of life for both the patient and the family.  Created space and opportunity for patient  and family to explore thoughts and feelings regarding current medical situation     A  discussion was had today regarding advanced directives.  Concepts specific to code status, artifical feeding and hydration, continued IV antibiotics and rehospitalization was had.  The difference between a aggressive medical intervention path  and a palliative comfort care path for this patient at this time was had.  Values and goals of care important to patient and family were attempted to be elicited.   MOST form    Natural trajectory and expectations at EOL were discussed.  Questions and concerns addressed.  Patient  encouraged to call with questions or concerns.     PMT will continue to support holistically.             {Primary Decision YNWGN:56213}    SUMMARY OF RECOMMENDATIONS   *** Code Status/Advance Care Planning: {Palliative Code status:23503}   Symptom Management:  ***  Palliative Prophylaxis:  {Palliative Prophylaxis:21015}  Additional  Recommendations (Limitations, Scope, Preferences): {Recommended Scope and Preferences:21019}  Psycho-social/Spiritual:  Desire for further Chaplaincy support:{YES NO:22349} Additional Recommendations: {PAL SOCIAL:21064}  Prognosis:  {Palliative Care Prognosis:23504}  Discharge Planning: {Palliative dispostion:23505}      Primary Diagnoses: Present on Admission:  Acute on chronic respiratory failure with hypoxia (HCC)  Influenza A  Alcoholic cirrhosis of liver with ascites (HCC)  Esophageal varices in alcoholic cirrhosis ([place])  Acute hyponatremia  Acute hepatic encephalopathy ([place])   I have reviewed the medical record, interviewed the patient and family, and examined the patient. The following aspects are  pertinent.  Past Medical History:  Diagnosis Date   A-fib [facility])    when initially stating dialysis january 2021 at [facility] per spouse; never heard anything else about it   Alcoholic cirrhosis [facility])    patient reports completing hep a and b vaccines in 2006   Anemia    has had 3 units of prbcs 2013   Anxiety    Arthritis    Asymptomatic gallstones    Ultrasound in 2006   B12 deficiency 11/21/2020   C. difficile colitis    Cholelithiasis    Chronic kidney disease    Dialysis M/W/F/Sa   Depression    Diabetes ([place])    Duodenal ulcer with hemorrhage    per patient in 2006 or 2007, records have been requested   Dyspnea    low oxygen level -  has O2 2 L all day   Esophageal varices ([place])    see PSH   GERD (gastroesophageal reflux disease)    Heart burn    History of alcohol abuse    quit 05/2013   HOH (hard of hearing)    Hypertension    Liver cirrhosis ([place])    ETOH   SBP (spontaneous bacterial peritonitis) ([place])    2020, November 2021.    Splenomegaly    Ultrasound in 2006   Social History   Socioeconomic History   Marital status: Married    Spouse name: Not on file   Number of children: 2   Years of education: Not on file   Highest education level: Not on file  Occupational History   Occupation: RETIRED    Employer: SELF EMPLOYED  Tobacco Use   Smoking status: Former    Packs/day: 2.00    Years: 25.00    Total pack years: 50.00    Types: Cigarettes    Quit date: 01/28/1986    Years since quitting: 36.4   Smokeless tobacco: Never  Vaping Use   Vaping Use: Never used  Substance and Sexual Activity   Alcohol use: No    Comment: quit in 05/2013   Drug use: No   Sexual activity: Not Currently  Other Topics Concern   Not on file  Social History Narrative   Two step children from second marriage (divorced) who live with him along with some grandchildren.    Social Determinants of Health   Financial Resource Strain: Not on file  Food  Insecurity: No Food Insecurity (06/16/2022)   Hunger Vital Sign    Worried About Running Out of Food in the Last Year: Never true    Ran Out of Food in the Last Year: Never true  Transportation Needs: No Transportation Needs (06/16/2022)   PRAPARE - Hydrologist (Medical): No    Lack of Transportation (Non-Medical): No  Physical Activity: Inactive (05/30/2020)   Exercise Vital Sign    Days of Exercise per Week: 0  days    Minutes of Exercise per Session: 0 min  Stress: Not on file  Social Connections: Not on file   Family History  Problem Relation Age of Onset   Colon cancer Father 45       deceased age 88   Breast cancer Sister        deceased   Diabetes Sister    Stroke Mother    Healthy Son    Healthy Daughter    Lung cancer Neg Hx    Ovarian cancer Neg Hx    Scheduled Meds:  allopurinol  100 mg Per Tube Q M,W,F-2000   darbepoetin (ARANESP) injection - DIALYSIS  200 mcg Subcutaneous Q Tue-1800   feeding supplement (PROSource TF20)  60 mL Per Tube Daily   feeding supplement (VITAL 1.5 CAL)  1,000 mL Per Tube Q24H   insulin aspart  0-15 Units Subcutaneous Q4H   ipratropium-albuterol  3 mL Nebulization Q6H WA   iron polysaccharides  150 mg Per Tube Daily   lactulose  30 g Per Tube TID   metoprolol tartrate  12.5 mg Per Tube BID   mirtazapine  15 mg Oral QHS   multivitamin  1 tablet Per Tube QHS   pantoprazole  40 mg Intravenous Q12H   pravastatin  20 mg Per Tube q AM   rifaximin  550 mg Per Tube BID   sulfamethoxazole-trimethoprim  1 tablet Per Tube Weekly   [START ON 07/05/2022] thiamine  100 mg Oral Daily   venlafaxine  75 mg Per Tube BID WC   Continuous Infusions:  sodium chloride Stopped (06/23/22 0834)   thiamine (VITAMIN B1) injection 500 mg (07/27/2022 0655)   Followed by   thiamine (VITAMIN B1) injection     PRN Meds:.acetaminophen **OR** acetaminophen, albuterol, alum & mag hydroxide-simeth, guaiFENesin-dextromethorphan, ondansetron  **OR** ondansetron (ZOFRAN) IV, mouth rinse, polyethylene glycol, sodium chloride Medications Prior to Admission:  Prior to Admission medications   Medication Sig Start Date End Date Taking? Authorizing Provider  acetaminophen (TYLENOL) 325 MG tablet Take 650 mg by mouth every 6 (six) hours as needed for mild pain, moderate pain, fever or headache.   Yes [provider]  allopurinol (ZYLOPRIM) 300 MG tablet Take 300 mg by mouth in the morning. 12/11/21  Yes [provider]  ALPRAZolam Duanne Moron) 0.5 MG tablet Take 1 tablet (0.5 mg total) by mouth See admin instructions. Take one tablet (0.5 mg) by mouth on Monday, Wednesday, Friday before dialysis, may also take one tablet (0.5 mg) daily as needed for anxiety. 02/07/22  Yes Tat, Shanon Brow, MD  ASCORBIC ACID PO Take 1 tablet by mouth in the morning. Vitamin C, unknown strength.   Yes [provider]  Calcium Carbonate Antacid (TUMS PO) Take 2 tablets by mouth with breakfast, with lunch, and with evening meal.   Yes [provider]  cetirizine (ZYRTEC) 10 MG tablet Take 10 mg by mouth in the morning. 06/24/21  Yes [provider]  Cholecalciferol (VITAMIN D-3 PO) Take 1 capsule by mouth in the morning.   Yes [provider]  diltiazem (DILACOR XR) 120 MG 24 hr capsule Take 120 mg by mouth in the morning. 05/19/22  Yes [provider]  famotidine (PEPCID) 20 MG tablet Take 1 tablet (20 mg total) by mouth 2 (two) times daily. 07/30/21 07/30/22 Yes Barton Dubois, MD  furosemide (LASIX) 40 MG tablet Take 80 mg by mouth in the morning. 12/29/19  Yes [provider]  HUMALOG KWIKPEN 100 UNIT/ML KwikPen  Inject 50 Units into the skin See admin instructions. Inject 50 units three times daily with meals. 04/23/21  Yes [provider]  HYDROcodone-acetaminophen (NORCO/VICODIN) 5-325 MG tablet Take 1 tablet by mouth daily as needed for severe pain.   Yes [provider]  hydrOXYzine  (ATARAX/VISTARIL) 25 MG tablet Take 25 mg by mouth 3 (three) times daily. 11/23/20  Yes [provider]  insulin glargine, 2 Unit Dial, (TOUJEO MAX) 300 UNIT/ML Solostar Pen Inject 120 Units into the skin at bedtime.   Yes [provider]  iron polysaccharides (NIFEREX) 150 MG capsule Take 150 mg by mouth at bedtime.   Yes [provider]  Lactobacillus (ACIDOPHILUS PO) Take 1 capsule by mouth in the morning.   Yes [provider]  lactulose (CHRONULAC) 10 GM/15ML solution Take 15 mLs (10 g total) by mouth 2 (two) times daily. Patient taking differently: Take 10 g by mouth See admin instructions. 10g twice daily on Sunday, Tuesday, Thursday, Saturday 10g in the evening on Monday, Wednesday, Friday after dialysis. 02/07/22  Yes Tat, Shanon Brow, MD  metoprolol succinate (TOPROL-XL) 25 MG 24 hr tablet Take 1 tablet (25 mg total) by mouth daily. Patient taking differently: Take 25 mg by mouth See admin instructions. 25 mg twice daily on Sunday, Tuesday, Thursday, Saturday. 25 mg at bedtime on Monday, Wednesday, Friday (does not take before dialysis) 02/22/22  Yes Tat, Shanon Brow, MD  mirtazapine (REMERON) 30 MG tablet Take 1 tablet (30 mg total) by mouth at bedtime. 02/10/20  Yes Roxan Hockey, MD  Multiple Vitamin (MULTIVITAMIN WITH MINERALS) TABS tablet Take 1 tablet by mouth in the morning.    Yes [provider]  nitroGLYCERIN (NITROGLYN) 2 % ointment Apply 2 inches topically once as needed for chest pain.   Yes [provider]  omega-3 acid ethyl esters (LOVAZA) 1 g capsule Take 2 g by mouth 2 (two) times daily.  04/07/19  Yes [provider]  ondansetron (ZOFRAN) 4 MG tablet Take 4 mg by mouth every 8 (eight) hours as needed for nausea or vomiting.   Yes [provider]  pantoprazole (PROTONIX) 40 MG tablet Take 1 tablet (40 mg total) by mouth 2 (two) times daily. 02/21/22  Yes Tat, Shanon Brow, MD  pravastatin (PRAVACHOL) 20 MG tablet Take 20 mg  by mouth in the morning.    Yes [provider]  rifaximin (XIFAXAN) 550 MG TABS tablet Take 1 tablet (550 mg total) by mouth 2 (two) times daily. 05/02/22  Yes Emokpae, Courage, MD  SSD 1 % cream Apply 2 Applications topically 2 (two) times daily as needed (bed sores). 02/26/22  Yes [provider]  sulfamethoxazole-trimethoprim (BACTRIM DS) 800-160 MG tablet TAKE 1 TABLET BY MOUTH every friday AFTER dialysis Patient taking differently: Take 1 tablet by mouth See admin instructions. 1 tablet every Friday, after dialysis 03/12/22  Yes Annitta Needs, NP  thiamine (VITAMIN B-1) 100 MG tablet Take 100 mg by mouth in the morning. 08/27/20  Yes [provider]  tiZANidine (ZANAFLEX) 2 MG tablet Take 2 mg by mouth at bedtime as needed for muscle spasms. 04/07/22  Yes [provider]  triamcinolone cream (KENALOG) 0.1 % Apply 1 application  topically 2 (two) times daily as needed (dry skin, itching). 12/30/17  Yes [provider]  venlafaxine (EFFEXOR) 75 MG tablet Take 75 mg by mouth 3 (three) times daily with meals.    Yes [provider]  zinc sulfate 220 (50 Zn) MG capsule Take 1  capsule (220 mg total) by mouth daily. Patient taking differently: Take 220 mg by mouth in the morning. 05/03/22  Yes Emokpae, Courage, MD  TRESIBA FLEXTOUCH 200 UNIT/ML FlexTouch Pen Inject 10-50 Units into the skin See admin instructions. Inject 10-50 units once daily at bedtime as needed for high blood sugar levels, per sliding scale. Patient not taking: Reported on 06/24/2022 02/17/22   [provider]   Allergies  Allergen Reactions   Tape Other (See Comments)    Very thin skin, tears easily   Review of Systems  Physical Exam  Vital Signs: BP (!) 126/47   Pulse 89   Temp 97.6 F (36.4 C) (Axillary)   Resp (!) 22   Ht '5\' 7"'$  (1.702 m)   Wt 71.2 kg   SpO2 100%   BMI 24.58 kg/m  Pain Scale: 0-10   Pain Score: 0-No pain   SpO2: SpO2: 100 % O2  Device:SpO2: 100 % O2 Flow Rate: .O2 Flow Rate (L/min): (S) 5 L/min  IO: Intake/output summary:  Intake/Output Summary (Last 24 hours) at 29-Jul-2022 1104 Last data filed at 07/29/22 0235 Gross per 24 hour  Intake 3860.43 ml  Output -500 ml  Net 4360.43 ml    LBM: Last BM Date : 06/29/22 Baseline Weight: Weight: 74.3 kg Most recent weight: Weight: 71.2 kg     Palliative Assessment/Data:   Discussed with Dr   Time In: 0800 Time Out: *** Time Total: *** Greater than 50%  of this time was spent counseling and coordinating care related to the above assessment and plan.  Signed by: Wadie Lessen, NP   Please contact Palliative Medicine Team phone at [phone] for questions and concerns.  For individual provider: See Shea Evans

## 2022-07-02 NOTE — Progress Notes (Signed)
NAME:  Jeffrey [name], MRN:  [id number], DOB:  Aug 08, 1945, LOS: 62 ADMISSION DATE:  06/14/2022, CONSULTATION DATE:  06/22/22 REFERRING MD:  Jeffrey [name] TRH, CHIEF COMPLAINT:  AMS   History of Present Illness:  77 yo M PMH ESRD on MWF HD, alcoholic cirrhosis, DM2, Chronic hypoxic respiratory failure on 2L, Afib, cirrhosis was admitted 1/15 for acute on chronic hypoxic resp failure, Flu PNA.   On  1/22 he was more lethargic required BiPAP and then intubation for acute respiratory acidosis.  He was had to be extubated on 1/25, moved to the floor 1/26 A bit less responsive on 1/27, ABG revealed recurrent severe respiratory acidosis.  PCCM consulted to evaluate and BiPAP reinitiated.    Pertinent  Medical History   Past Medical History:  Diagnosis Date   A-fib [facility])    when initially stating dialysis january 2021 at [facility] per spouse; never heard anything else about it   Alcoholic cirrhosis [facility])    patient reports completing hep a and b vaccines in 2006   Anemia    has had 3 units of prbcs 2013   Anxiety    Arthritis    Asymptomatic gallstones    Ultrasound in 2006   B12 deficiency 11/21/2020   C. difficile colitis    Cholelithiasis    Chronic kidney disease    Dialysis M/W/F/Sa   Depression    Diabetes ([place])    Duodenal ulcer with hemorrhage    per patient in 2006 or 2007, records have been requested   Dyspnea    low oxygen level -  has O2 2 L all day   Esophageal varices ([place])    see PSH   GERD (gastroesophageal reflux disease)    Heart burn    History of alcohol abuse    quit 05/2013   HOH (hard of hearing)    Hypertension    Liver cirrhosis ([place])    ETOH   SBP (spontaneous bacterial peritonitis) ([place])    2020, November 2021.    Splenomegaly    Ultrasound in 2006    Significant Hospital Events: Including procedures, antibiotic start and stop dates in addition to other pertinent events   1/15 admit to [facility] for iHD need 1/15 nephro consult, HD  1/16  Flu A pos, tamiflu  1/18 para, no SBP  1/19 steroids added for incr O2 need  1/22 PCCM consulted for AMS hypercarbic resp failure, SOB. Transfer to ICU for intubation  1/25 Extubated To floor 1/26 Hypercapneic and required BiPAP 1/27  Interim History / Subjective:  [name] is poorly responsive   Objective   Blood pressure (!) 127/49, pulse 87, temperature 97.6 F (36.4 C), temperature source Axillary, resp. rate (!) 22, height '5\' 7"'$  (1.702 m), weight 71.2 kg, SpO2 100 %.    Vent Mode: BIPAP FiO2 (%):  [40 %-50 %] 40 % Set Rate:  [10 bmp] 10 bmp PEEP:  [5 cmH20-6 cmH20] 6 cmH20   Intake/Output Summary (Last 24 hours) at 08-Jul-2022 0900 Last data filed at 07/08/2022 0235 Gross per 24 hour  Intake 3860.43 ml  Output -500 ml  Net 4360.43 ml    Filed Weights   06/27/22 0458 06/29/22 1336 06/29/22 1625  Weight: 68.7 kg 70.6 kg 71.2 kg    Examination: General: Frail cachectic elderly male he preflight forces appear weak HEENT: Core track tube is in place.  No JVD is appreciated Neuro: Arouses to voice but appears weak CV: Heart sounds are distant PULM:  Poor air movement  GI: soft, bsx4 active rectal tube in place with liquid feces GU: Increased urine output Extremities: Lower extremities with vascular ischemia Skin: no rashes or lesions   Resolved Hospital Problem list   Shock  Assessment & Plan:  Acute/subacute metabolic encephalopathy Acute on chronic respiratory failure with hypoxia and hypercapnia Influenza A PNA Acute on chronic deficiency anemia History of GI bleed Thrombocytopenia, improved to his usual baseline A-fib with RVR ESRD on HD MWF Decompensated alcoholic cirrhosis with ascites with esophageal varices and encephalopathy DM2 with hyperglycemia  Pulm problem list  Acute on chronic Hypoxic and Hypercarbic respiratory failure 2/2 mixed picture of recent PNA, prob element of pulmonary edema but most significantly driven by severe deconditioning  and decreased abd compliance.    Due to the severity of his deconditioning coupled with multiple comorbidities and generalized failure to thrive not much to offer from a pulmonary critical care standpoint.  His age coupled with his comorbidities do not lend himself to good outcome.    Plan Continue current care Diuresis as tolerated Palliative care I suspect he will continue to decline  Best Practice (right click and "Reselect all SmartList Selections" daily)    Jeffrey [name] ACNP Acute Care Nurse Practitioner [place] Please consult [place] 07-30-22, 9:01 AM

## 2022-07-02 NOTE — Progress Notes (Signed)
RT NOTE:  Pt back on BIPAP per MD order.

## 2022-07-02 NOTE — Progress Notes (Signed)
Physical Therapy Treatment Patient Details Name: Jeffrey [name] MRN: [id number] DOB: [birth year] Today's Date: 07-04-2022   History of Present Illness 77 yo male admitted 07/01/2022 due to SOB/acute respiratory distress found to be positive for flu.1/22 respiratory distress and hypotensions during HD requiring intubation. Extubated 1/25. PMH: DM, HTN, GIB, chronic resp failure on 2 L, Afib, ESRD on HD MWF, cirrhosis requiring periodic paracentesis.    PT Comments    Pt greeted supine in bed and agreeable to session with focus on bed mobility and functional transfer progress, however pt continues to be limited by weakness and fatigue. Pt able to come to sitting EOB with mod a and maintain sitting balance ~15 mins with min guard for safety. Pt unable to come to full standing x3 attempts with max a +2 to RW. Pt flexiseal found to be leaking upon arrival with session truncated secondary to peri-care and pt increased fatigue from rolling. Current plan remains appropriate to address deficits and maximize functional independence and decrease caregiver burden. Pt continues to benefit from skilled PT services to progress toward functional mobility goals.      Recommendations for follow up therapy are one component of a multi-disciplinary discharge planning process, led by the attending physician.  Recommendations may be updated based on patient status, additional functional criteria and insurance authorization.  Follow Up Recommendations  Acute inpatient rehab (3hours/day)     Assistance Recommended at Discharge Frequent or constant Supervision/Assistance  Patient can return home with the following A lot of help with walking and/or transfers;A lot of help with bathing/dressing/bathroom;Assistance with cooking/housework;Direct supervision/assist for medications management;Assist for transportation   Equipment Recommendations  None recommended by PT    Recommendations for Other Services       Precautions /  Restrictions Precautions Precautions: Fall Precaution Comments: flexiseal, cortrak Restrictions Weight Bearing Restrictions: No     Mobility  Bed Mobility Overal bed mobility: Needs Assistance Bed Mobility: Supine to Sit, Sit to Supine, Rolling Rolling: Mod assist   Supine to sit: HOB elevated, Mod assist Sit to supine: Mod assist   General bed mobility comments: required assistance with BLEs and used bed pad to scoot hips towards EOB and return to supine Patient Response: Flat affect  Transfers Overall transfer level: Needs assistance Equipment used: None, Rolling walker (2 wheels) Transfers: Sit to/from Stand Sit to Stand: Max assist, +2 physical assistance           General transfer comment: attempted standing with RW ans +2 max a with patient unable to come to complete stand x2 attempts    Ambulation/Gait                   Stairs             Wheelchair Mobility    Modified Rankin (Stroke Patients Only)       Balance Overall balance assessment: Needs assistance Sitting-balance support: No upper extremity supported, Feet supported Sitting balance-Leahy Scale: Fair Sitting balance - Comments: min guard for safety with sitting on EOB   Standing balance support: Bilateral upper extremity supported, During functional activity Standing balance-Leahy Scale: Zero Standing balance comment: unable to come to complete stand with max a +2                            Cognition Arousal/Alertness: Awake/alert Behavior During Therapy: Flat affect Overall Cognitive Status: Difficult to assess Area of Impairment: Safety/judgement, Problem solving, Attention, Following commands, Awareness, Memory  Orientation Level: Disoriented to, Place, Time, Situation Current Attention Level: Focused Memory: Decreased short-term memory Following Commands: Follows one step commands inconsistently, Follows one step commands with increased  time Safety/Judgement: Decreased awareness of safety, Decreased awareness of deficits   Problem Solving: Slow processing, Decreased initiation, Difficulty sequencing, Requires tactile cues, Requires verbal cues General Comments: alert, difficult to understand due to wispering, mouthing words        Exercises      General Comments General comments (skin integrity, edema, etc.): VSS on supplemental O2      Pertinent Vitals/Pain Pain Assessment Pain Assessment: Faces Faces Pain Scale: Hurts a little bit Pain Location: buttocks Pain Descriptors / Indicators: Discomfort Pain Intervention(s): Monitored during session, Limited activity within patient's tolerance    Home Living                          Prior Function            PT Goals (current goals can now be found in the care plan section) Acute Rehab PT Goals PT Goal Formulation: With patient Time For Goal Achievement: 07/09/22 Progress towards PT goals: Not progressing toward goals - comment (fatigue)    Frequency    Min 3X/week      PT Plan      Co-evaluation              AM-PAC PT "6 Clicks" Mobility   Outcome Measure  Help needed turning from your back to your side while in a flat bed without using bedrails?: A Little Help needed moving from lying on your back to sitting on the side of a flat bed without using bedrails?: A Lot Help needed moving to and from a bed to a chair (including a wheelchair)?: Total Help needed standing up from a chair using your arms (e.g., wheelchair or bedside chair)?: Total Help needed to walk in hospital room?: Total Help needed climbing 3-5 steps with a railing? : Total 6 Click Score: 9    End of Session Equipment Utilized During Treatment: Oxygen Activity Tolerance: Patient limited by fatigue Patient left: in bed;with call bell/phone within reach;with nursing/sitter in room Nurse Communication: Mobility status;Other (comment) (flexi seal leaking) PT Visit  Diagnosis: Other abnormalities of gait and mobility (R26.89);Muscle weakness (generalized) (M62.81);Other symptoms and signs involving the nervous system (R29.898);Unsteadiness on feet (R26.81)     Time: 1607-3710 PT Time Calculation (min) (ACUTE ONLY): 31 min  Charges:  $Therapeutic Activity: 23-37 mins                     Cola Highfill R. PTA Acute Rehabilitation Services Office: [place] 07-21-2022, 4:03 PM

## 2022-07-02 NOTE — Progress Notes (Signed)
Inpatient Rehabilitation Admissions Coordinator   I await further tolerance with hemodialysis . Not a current candidate for CIR. I will follow.  Danne Baxter, RN, MSN Rehab Admissions Coordinator [phone] 07-28-2022 4:21 PM

## 2022-07-02 NOTE — Progress Notes (Incomplete)
Attempted to place BiPAP on pt d/t pt starting to fall asleep. Per MD, pt should be on BiPAP whenever asleep d/t O2 sats. Placed on pt for approx 5 mins before pt demanded to be taken off d/t the mask being uncomfortable. MD notified and made aware. Will continue to monitor pt.

## 2022-07-02 NOTE — Progress Notes (Signed)
RT NOTE:  Pt off BIPAP when RT rounded. Pt tolerating 6L [place] well at this time.

## 2022-07-02 NOTE — Progress Notes (Addendum)
[facility] Progress Note   Subjective:   Seen in room - remains on O2 5-6L/min. BP better today, HD ended a little early yesterday - was very hypotensive with SBP 60-70 range despite IV albumin and the dialysis RN was unable to give midodrine via his feeding tube. Got no fluid off unfortunately. CXR 1/29 with pulm edema present.   Objective Vitals:   07/14/2022 0013 Jul 14, 2022 0435 Jul 14, 2022 0747 07/14/22 0752  BP: (!) 111/43 (!) 125/44  (!) 127/49  Pulse: 87 85 85 87  Resp: (!) 22 (!) 21 (!) 21 (!) 22  Temp: 98.1 F (36.7 C) 97.9 F (36.6 C)  97.6 F (36.4 C)  TempSrc: Axillary Axillary  Axillary  SpO2: 98% 99% 99% 100%  Weight:      Height:       Physical Exam General: Chronically ill appearing man, + mild WOB. O2 in place.  NG tube in place. Heart: RRR; no murmur Lungs: Mild tachypnea, no wheezing Abdomen: soft Extremities: no LE edema Dialysis Access:  LUE AVF + thrill  Additional Objective Labs: Basic Metabolic Panel: Recent Labs  Lab 06/28/22 0149 06/29/22 0208 Jul 14, 2022 0346  NA 136 138 137  K 4.3 4.3 3.7  CL 101 102 100  CO2 '22 24 29  '$ GLUCOSE 172* 99 130*  BUN 51* 64* 48*  CREATININE 2.92* 3.60* 3.00*  CALCIUM 8.9 8.7* 8.7*  PHOS 4.6 5.1* 4.0   Liver Function Tests: Recent Labs  Lab 06/28/22 0146 06/28/22 0149 06/29/22 0207 06/29/22 0208 07/14/22 0346  AST 81*  --  168*  --   --   ALT 63*  --  105*  --   --   ALKPHOS 416*  --  466*  --   --   BILITOT 0.7  --  0.9  --   --   PROT 5.3*  --  5.3*  --   --   ALBUMIN 2.0*   < > 1.9* 1.8* 2.2*   < > = values in this interval not displayed.   CBC: Recent Labs  Lab 06/26/2022 0920 06/26/22 0221 06/27/22 0121 06/28/22 0146 06/29/22 0207  WBC 7.2 13.7* 11.3* 14.2* 8.7  NEUTROABS 6.9  --   --   --   --   HGB 8.5* 8.3* 8.4* 8.5* 8.6*  HCT 25.0* 25.5* 27.4* 26.8* 28.5*  MCV 95.4 95.5 99.6 97.5 100.4*  PLT 53* 95* 102* 106* 95*   Studies/Results: DG CHEST PORT 1 VIEW  Result Date:  06/29/2022 CLINICAL DATA:  Shortness of breath, respiratory distress, history anemia, GERD, hypertension EXAM: PORTABLE CHEST 1 VIEW COMPARISON:  Portable exam 1251 hours compared to 06/29/2022 FINDINGS: Enlargement of cardiac silhouette with pulmonary vascular congestion. Mediastinal contours normal. Bibasilar effusions and atelectasis. Perihilar to basilar infiltrates, may represent minimal pulmonary edema IMPRESSION: Enlargement of cardiac silhouette with pulmonary vascular congestion. Bibasilar effusions and atelectasis with mild perihilar to basilar infiltrates favoring pulmonary edema. Electronically Signed   By: Lavonia Dana M.D.   On: 06/29/2022 13:08   DG Swallowing Func-Speech Pathology  Result Date: 06/29/2022 Table formatting from the original result was not included. Images from the original result were not included. Objective Swallowing Evaluation: Type of Study: MBS-Modified Barium Swallow Study  Patient Details Name: GAWAIN CROMBIE MRN: [id number] Date of Birth: 1946/04/19 Today's Date: 06/29/2022 Time: SLP Start Time (ACUTE ONLY): 0830 -SLP Stop Time (ACUTE ONLY): 0838 SLP Time Calculation (min) (ACUTE ONLY): 8 min Past Medical History: Past Medical History: Diagnosis Date  A-fib (  [facility])   when initially stating dialysis january 2021 at [facility] per spouse; never heard anything else about it  Alcoholic cirrhosis [facility])   patient reports completing hep a and b vaccines in 2006  Anemia   has had 3 units of prbcs 2013  Anxiety   Arthritis   Asymptomatic gallstones   Ultrasound in 2006  B12 deficiency 11/21/2020  C. difficile colitis   Cholelithiasis   Chronic kidney disease   Dialysis M/W/F/Sa  Depression   Diabetes ([place])   Duodenal ulcer with hemorrhage   per patient in 2006 or 2007, records have been requested  Dyspnea   low oxygen level -  has O2 2 L all day  Esophageal varices ([place])   see [facility]  GERD (gastroesophageal reflux disease)   Heart burn   History of alcohol abuse   quit 05/2013  HOH  (hard of hearing)   Hypertension   Liver cirrhosis ([employer])   ETOH  SBP (spontaneous bacterial peritonitis) ([place])   2020, November 2021.   Splenomegaly   Ultrasound in 2006 Past Surgical History: Past Surgical History: Procedure Laterality Date  AGILE CAPSULE N/A 09/20/2019  Procedure: AGILE CAPSULE;  Surgeon: Daneil Dolin, MD;  [facility];  Service: Endoscopy;  Laterality: N/A;  APPENDECTOMY    AV FISTULA PLACEMENT Left 08/17/2019  Procedure: ARTERIOVENOUS (AV) FISTULA CREATION VERSES ARTERIOVENOUS GRAFT;  Surgeon: [name], MD;  Location: [facility];  Service: Vascular;  Laterality: Left;  BASCILIC VEIN TRANSPOSITION Left 09/28/2019  Procedure: LEFT SECOND STAGE [place];  Surgeon: [name], MD;  Location: [place];  Service: Vascular;  Laterality: Left;  BIOPSY  02/03/2018  Procedure: BIOPSY;  Surgeon: Daneil Dolin, MD;  [facility];  Service: Endoscopy;;  bx of gastric polyps  BIOPSY  09/19/2019  Procedure: BIOPSY;  Surgeon: Danie Binder, MD;  [facility];  Service: Endoscopy;;  BIOPSY  01/16/2021  Procedure: BIOPSY;  Surgeon: Daneil Dolin, MD;  [facility];  Service: Endoscopy;;  CATARACT EXTRACTION Right   COLONOSCOPY  03/10/2005  Rectal polyp as described above, removed with snare. Left sided  diverticula. The remainder of the colonic mucosa appeared normal. Inflammed polyp on path.  COLONOSCOPY  04/1999  Dr. Thea Silversmith polyps removed,  Path showed hyperplastic  COLONOSCOPY  10/2015  Dr. Britta Mccreedy: diverticulosis, single sessile tubular adenoma 3-22m in size removed from descending colon.   COLONOSCOPY WITH ESOPHAGOGASTRODUODENOSCOPY (EGD)  03/31/2012  RKKX:FGHWEXHAVMs. Colonic diverticulosis. tubular adenoma colon  COLONOSCOPY WITH ESOPHAGOGASTRODUODENOSCOPY (EGD)  06/2019  FORSYTH: small esophageal varices without high risk stigmata, single large AVM on lesser curvature in gastric body s/p APC ablation, gastric antral and  duodenal bulb polyposis. No significant source to explain transfusion dependent anemia. Colonoscopy with portal colopathy and diffuse edema, changes of Cdiff colitis on colonoscopy  COLONOSCOPY WITH PROPOFOL N/A 01/19/2021  Surgeon: CDaryel November MD; 15 mm polyp in descending colon removed piecemeal, 6 mm polyp in ascending colon and 12 mm polyp in sigmoid colon not removed due to high bleeding risk, benign polypoid lesion at splenic flexure, diverticulosis in sigmoid and descending colon, rectal varices.  COLONOSCOPY WITH PROPOFOL N/A 01/16/2021  Surgeon: RDaneil Dolin MD;  Portal colopathy, large rectal varices, polypoid mass at splenic flexure biopsy, two 1 cm left colon polyps not removed, left-sided diverticulosis.  Reported patient had bleeding from splenic flexure lesion.  Pathology with acutely inflamed colonic mucosa with localized ulceration and prominent reactive changes.  ESOPHAGEAL BANDING N/A 02/03/2018  Procedure: ESOPHAGEAL BANDING;  Surgeon: Daneil Dolin, MD;  [facility];  Service: Endoscopy;  Laterality: N/A;  ESOPHAGEAL BANDING  01/16/2021  Procedure: ESOPHAGEAL BANDING;  Surgeon: Daneil Dolin, MD;  [facility];  Service: Endoscopy;;  ESOPHAGOGASTRODUODENOSCOPY  01/15/2005  Three columns grade 1 to 2 esophageal varices, otherwise normal esophageal mucosa.  Esophagus was not manipulated otherwise./Nodularity of the antrum with overlying erosions, nonspecific finding. Path showed rare H.pylori  ESOPHAGOGASTRODUODENOSCOPY  09/2008  Dr. Gaylene Brooks columns of grade 2-3 esoph varices, only one column was prominent. Portal gastropathy, multiple gastrc polyps at antrum, two were 2cm with black eschar, bulbar polyps, bulbar erosions  ESOPHAGOGASTRODUODENOSCOPY  11/2004  Dr. Brantley Stage 3 esoph varices  ESOPHAGOGASTRODUODENOSCOPY  03/31/2012  RMR: 4 columns(3-GR 2, 1-GR1) non-bleeding esophageal varices, portal gastropathy, small HH, early GAVE, multiple  gastric polyps   ESOPHAGOGASTRODUODENOSCOPY (EGD) WITH PROPOFOL N/A 02/03/2018  Dr. Gala Romney: Esophageal varices, 3 columns grade 1-2.  Portal hypertensive gastropathy.  Multiple gastric polyps, biopsy consistent with hyperplastic.  ESOPHAGOGASTRODUODENOSCOPY (EGD) WITH PROPOFOL N/A 09/19/2019  Fields: grade I and II esophageal varcies, mild portal hypertensive gastropathy, moderate gastritis but no H. pylori, single hyperplastic gastric polyp removed, obvious source for melena/transfusion dependent anemia not identified, may be due to friable gastric and duodenal mucosa in the setting of portal hypertension  ESOPHAGOGASTRODUODENOSCOPY (EGD) WITH PROPOFOL N/A 01/16/2021  Surgeon: Daneil Dolin, MD; Portal hypertensive gastropathy, grade 2 esophageal varices with bleeding stigmata s/p banding x4, slight nodularity and congestion involving bulb and second portion of duodenum, nonspecific.  Recommended repeat EGD in 4 weeks.  ESOPHAGOGASTRODUODENOSCOPY (EGD) WITH PROPOFOL N/A 05/22/2021  stigmata of prior esophageal banding and no significant varices seen. Portal gastropathy. Polypoid gastric and duodenal mucosa with overlying erosions, marked mucosal friability.  ESOPHAGOGASTRODUODENOSCOPY (EGD) WITH PROPOFOL N/A 02/06/2022  Procedure: ESOPHAGOGASTRODUODENOSCOPY (EGD) WITH PROPOFOL;  Surgeon: Harvel Quale, MD;  [facility];  Service: Gastroenterology;  Laterality: N/A;  GASTRIC VARICES BANDING  03/31/2012  Procedure: GASTRIC VARICES BANDING;  Surgeon: Daneil Dolin, MD;  [facility];  Service: Endoscopy;  Laterality: N/A;  GIVENS CAPSULE STUDY N/A 09/21/2019  Poor study with a lot of debris obstructing much of the view of the first approximate 3 hours out of 6 hours.  No obvious source of bleeding identified.  Sequela of bleeding and old blood seen in the form of"whisps" of blood, blood flecks mostly toward the end of the study.  HEMOSTASIS CLIP PLACEMENT  01/19/2021  Procedure:  HEMOSTASIS CLIP PLACEMENT;  Surgeon: [name], MD;  Location: [facility];  Service: Gastroenterology;;  HEMOSTASIS CLIP PLACEMENT  02/06/2022  Procedure: HEMOSTASIS CLIP PLACEMENT;  Surgeon: Montez Morita, Quillian Quince, MD;  [facility];  Service: Gastroenterology;;  gastric body  HOT HEMOSTASIS  02/06/2022  Procedure: HOT HEMOSTASIS (ARGON PLASMA COAGULATION/BICAP);  Surgeon: Montez Morita, Quillian Quince, MD;  [facility];  Service: Gastroenterology;;  IR PARACENTESIS  06/18/2022  IR REMOVAL TUN CV CATH W/O FL  02/15/2020  POLYPECTOMY  09/19/2019  Procedure: POLYPECTOMY;  Surgeon: Danie Binder, MD;  [facility];  Service: Endoscopy;;  POLYPECTOMY  01/19/2021  Procedure: POLYPECTOMY;  Surgeon: [name], MD;  Location: [place];  Service: Gastroenterology;;  UMBILICAL HERNIA REPAIR  2017  exploratory laparotomy, abdominal washout HPI: 77 yo male admitted 06/26/2022 due to SOB/acute respiratory distress found to be positive for flu.1/22 respiratory distress and hypotensions during HD requiring intubation. ETT 1/22-25. PMH:  DM, HTN, GIB, chronic resp failure on 2 L, Afib, ESRD on HD MWF, cirrhosis requiring periodic paracentesis.  Subjective: alert  Recommendations for follow up therapy are one component of a multi-disciplinary discharge planning process, led by the attending physician.  Recommendations may be updated based on patient status, additional functional criteria and insurance authorization. Assessment / Plan / Recommendation   06/29/2022  10:30 AM Clinical Impressions Clinical Impression Pt exhibits moderate oral and mod-severe pharyngeal dysphagia. Oral phase noted for reduced lingual to palatal seal, delayed and inefficient propulsion with decreased cohesion of bolus. Pt had laryngeal penetration and aspiration with all consistencies attempted (thin, nectar and puree) with delayed initition, decreased laryngeal elevation and closure, reduced tongue base  retraction and epiglottic deflection. There is also reduced pharyngoesophageal segment distention causing reduced transit into esophagus material collecting above the PES. Pt was cued to produce multiple swallows however this resulted in further penetration and aspiration with each attempt. He was prompted for throat clear which he did without clearance and he was unable to produce strong cough (is currently aphonic). Recommend he continue with NPO status (has Cortrak) and ST will continue to work with pt moving toward po recommendation. Called wife with results and she denies dysphagia prior to this admission.     06/03/2022   3:00 PM Treatment Recommendations Treatment Recommendations Therapy as outlined in treatment plan below     06/04/2022   3:00 PM Prognosis Prognosis for Safe Diet Advancement Good    No data to display        06/29/2022   8:45 AM Other Recommendations Follow Up Recommendations --   07/01/2022   3:00 PM Frequency and Duration  Speech Therapy Frequency (ACUTE ONLY) min 2x/week Treatment Duration 2 weeks     06/29/2022  10:30 AM Oral Phase Oral Phase Impaired Oral - Nectar Teaspoon Weak lingual manipulation;Incomplete tongue to palate contact;Reduced posterior propulsion;Decreased bolus cohesion Oral - Thin Teaspoon Weak lingual manipulation;Incomplete tongue to palate contact;Reduced posterior propulsion;Decreased bolus cohesion Oral - Puree Weak lingual manipulation;Incomplete tongue to palate contact;Reduced posterior propulsion;Decreased bolus cohesion    06/29/2022  10:30 AM Pharyngeal Phase Pharyngeal Phase Impaired Pharyngeal- Nectar Teaspoon Delayed swallow initiation-vallecula;Penetration/Aspiration during swallow;Pharyngeal residue - valleculae;Pharyngeal residue - pyriform;Reduced tongue base retraction;Reduced airway/laryngeal closure;Reduced laryngeal elevation;Reduced anterior laryngeal mobility;Reduced epiglottic inversion Pharyngeal Material enters airway, passes BELOW cords without  attempt by patient to eject out (silent aspiration) Pharyngeal- Thin Teaspoon Penetration/Aspiration during swallow;Penetration/Apiration after swallow;Pharyngeal residue - valleculae;Pharyngeal residue - pyriform;Reduced epiglottic inversion;Reduced anterior laryngeal mobility;Reduced laryngeal elevation;Reduced airway/laryngeal closure;Reduced tongue base retraction;Delayed swallow initiation-vallecula Pharyngeal Material enters airway, passes BELOW cords without attempt by patient to eject out (silent aspiration);Material enters airway, CONTACTS cords and not ejected out Pharyngeal- Puree Penetration/Aspiration during swallow;Pharyngeal residue - valleculae;Pharyngeal residue - pyriform;Reduced airway/laryngeal closure;Reduced laryngeal elevation;Reduced anterior laryngeal mobility;Reduced epiglottic inversion;Delayed swallow initiation-vallecula Pharyngeal Material enters airway, passes BELOW cords without attempt by patient to eject out (silent aspiration)    06/29/2022  10:30 AM Cervical Esophageal Phase  Cervical Esophageal Phase Impaired [place] Siren 06/29/2022, 10:45 AM                     Medications:  sodium chloride Stopped (06/23/22 0834)   thiamine (VITAMIN B1) injection 500 mg (07/30/2022 0655)   Followed by   thiamine (VITAMIN B1) injection      allopurinol  100 mg Per Tube Q M,W,F-2000   darbepoetin (ARANESP) injection - DIALYSIS  200 mcg Subcutaneous Q Tue-1800   feeding supplement (PROSource  TF20)  60 mL Per Tube Daily   feeding supplement (VITAL 1.5 CAL)  1,000 mL Per Tube Q24H   insulin aspart  0-15 Units Subcutaneous Q4H   ipratropium-albuterol  3 mL Nebulization Q6H WA   iron polysaccharides  150 mg Per Tube Daily   lactulose  30 g Per Tube TID   metoprolol tartrate  12.5 mg Per Tube BID   mirtazapine  15 mg Oral QHS   multivitamin  1 tablet Per Tube QHS   pantoprazole  40 mg Intravenous Q12H   pravastatin  20 mg Per Tube q AM   rifaximin  550 mg Per Tube BID    sulfamethoxazole-trimethoprim  1 tablet Per Tube Weekly   [START ON 07/05/2022] thiamine  100 mg Oral Daily   venlafaxine  75 mg Per Tube BID WC    Dialysis Orders: [facility] MWF  4h  80.5kg -> weight now around 65-68kg, 350/500  2/2.5 bath  AVF  Hep none  - Mircera 25mg q 2 weeks (due 1/15)    Assessment/Plan: Dyspnea/respiratory distress - secondary to volume overload/Influenza A. Initially requiring BiPAP on admit, then improved, then decompensated again and required intubation on 1/22, now extubated. CXR changes from admit (CHF and/or viral pneumonitis) have resolved. ^ WOB 1/29 - brought for HD but unable to get any fluid off of him d/t hypotension. Still using BiPAP nightly and O2 during the day. Encephalopathy - hepatic / metabolic cause, lactulose/rifaximin per primary.  ESRD -  Continue HD on MWF schedule, ended early 1/29 due to hypotension on HD. Will see how goes tomorrow - making sure that gets midodrine pre-HD. If still unable to get anything off him, then would consider palliative services. BP/volume: Hypotension/shock on admit, now improved. Well below prior EDW, will lower on discharge Afib with RVR - on amio, no a/c w/ possible GI bleed Possible GI bleed - s/p octreotide, remains on PPI BID Anemia of ESRD: Hgb 8.6/stable, continue Aranesp 201m weekly (q Tues). On "PO" iron. Metabolic bone disease -  Ca and phos in goal. No VDRA as outpatient  Nutrition - Alb very low, now on TF via NG tube - failed swallow study.  Cirrhosis with ascites - per primary team, s/p paracentesis with 2.3L 06/19/22 H/o Etoh abuse Flu A - completed course of Tamiflu  DNR      KaVeneta PentonPA-C 06/03/19/249:27 AM  [facility] attending: The patient was seen and examined.  Chart reviewed.  I agree with the assessment and plan as outlined above.  He is in respiratory distress.  He did not tolerate dialysis yesterday due to intradialytic hypotension.  Unable to  ultrafiltrate.  Noted pulmonary critical care note, not much to offer.  He looks frail and not clearly doing well.  We will attempt to do dialysis tomorrow with midodrine, lowering dialysate temperature.  I suggest palliative/hospice care if he cannot tolerate dialysis tomorrow.  D.Katheran JamesMD [facility].

## 2022-07-02 NOTE — Progress Notes (Signed)
Late Note Entry  Contacted [facility] and spoke to [place]. Clinic advised of pt's passing while inpt.   Melven Sartorius Renal Navigator [phone]

## 2022-07-02 NOTE — Progress Notes (Signed)
RT at bedside this AM for morning breathing tx. Pt currently on 5L [place] tolerating well with BiPAP on stby at bedside.

## 2022-07-02 NOTE — Death Summary Note (Signed)
DEATH SUMMARY   Patient Details  Name: FORNEY KLEINPETER MRN: [id number] DOB: Jun 09, 1945 LYY:TKPT, Weldon Picking, MD Admission/Discharge Information   Admit Date:  July 10, 2022  Date of Death:   2022/07/25  Time of Death:    2010-09-06  Length of Stay: 15   Principle Cause of death: acute hypoxic and hypercarbic respiratory failure   Hospital Diagnoses: Principal Problem:   Acute on chronic respiratory failure with hypoxia ([place]) Active Problems:   Influenza Kali Ambler   Acute hepatic encephalopathy (HCC)   ESRD on dialysis ([place])   Alcoholic cirrhosis of liver with ascites ([place])   Esophageal varices in alcoholic cirrhosis ([place])   Acute hyponatremia   Controlled type 2 diabetes mellitus with chronic kidney disease on chronic dialysis, with long-term current use of insulin (HCC)   Protein-calorie malnutrition, severe  Hospital Course: 77 yo M PMH ESRD on MWF HD, alcoholic cirrhosis, DM2, Chronic hypoxic respiratory failure on 2L, Afib, cirrhosis was admitted 07-10-22 for acute on chronic hypoxic resp failure, Flu PNA.   On  1/22 he was more lethargic. While in iHD he was started on BiPAP but remained lethargic.  ABG with resp acidosis    Significant events 07/10/22 admit to [place] from [facility] for iHD need 07-10-22 nephro consult, HD  1/16 Flu Lindsi Bayliss pos, tamiflu  1/18 para, no SBP  1/19 steroids added for incr O2 need  1/22 PCCM consulted for AMS hypercarbic resp failure, SOB. Transfer to ICU for intubation  1/27 TRH assumed care  He's continued to be lethargic over the last few days, requiring bipap nightly.  Did not tolerate dialysis yesterday due to hypotension.  Attempted bipap today during the day, but it was removed after he asked for the mask to be taken off for comfort.  O2 needs increased during the day.  Unfortunately, after shift change, he developed worsening hypoxia, obtundation, and developed bradycardia.  Plan was to resume bipap, but he rapidly declined, bradying down.  I spoke to Mrs. Krupka to clarified goals of  care, she did not want CPR or him to be intubated.  He passed while I was on the phone with his wife.  I expressed my condolences and she expressed appreciation for his care and that she was able to see him today.   Assessment and Plan:  See below for todays plan, see prior progress notes for additional details  Goals of care Appreciate palliative care assistance.  For now, planning to see how he does with nightly bipap and dialysis.  His mental status has waxed and waned, but has generally been somewhat lethargic most days.  He's very hoarse after being intubated which limits communication with him to some degree.  Will see how he does with nightly bipap.  If improving, can continue that and pursue outpatient.  I'm concerned that he may not improve to that point and discussed this with his wife, sister, daughter today.  They're understanding of the potential need to consider hospice/comfort measures in that scenario.  Planning to see how he does day to day.   Acute metabolic encephalopathy  Hepatic Encephalopathy Multifactorial.  Primarily driven by hypercarbia at this time.  Hepatic encephalopathy, uremia other contributing causes.   Nightly bipap Bipap ordered for now with continued resp acidosis  Lactulose, rifaximin Minimize sedating meds - taper remeron and effexor as well to avoid withdrawal (discussed with pharmacy).  Remove prn xanax from med list. High dose thiamine Follow repeat ammonia levels (normalized)  Dialysis per renal    Acute  on chronic respiratory failure with hypoxia and hypercapnia Volume Overload  Pulmonary Edema  Bilateral Pulmonary Effusions Influenza Syncere Eble PNA -currently on 4-8 L -CXR 1/29 with bibasilar effusions, pulmonary edema (volume per renal - dialysis cut short yesterday due to hypotension)  -scheduled nebs -completed tamiflu  -No indication for antibiotic or steroid -needs nightly bipap (and when he's sleeping during the day) - pulm suspects hypercarbic  resp failure due to multiple comorbid illnesses and profound physical deconditioning rather than Ulrich Soules primary pulmonary process    Dysphagia With NG tube for meds/tube feeds SLP recommending NPO Hold when on bipap - becoming an issue since he's on bipap nightly and has also needed during the day.  Appreciate RD assistance. Appreciate SLP/RD   Hoarseness Discussed with PCCM, if continued, would c/s ENT (per discussion, they noted if persistent for another 48 hrs to consider discussion with ENT)   Acute on chronic deficiency anemia History of GI bleed Patient had 2 EGD in September 2023 and had APC for bleeding gastropathy.  He only has grade 1 varices.  He had Yaakov Saindon colonoscopy in 12/2018 through which had Blakleigh Straw possible bleeding polypoid lesion in the splenic flexure. Hb down to 5.6 on 1/24, improved to 8 after 2 units pRBC Relatively stable since around 8.4 GI was consulted 1/25, they're planning to defer EGD if no evidence active GI bleed, recent endoscopic eval within 6 months. -Continue PPI 40 mg twice daily -iron def and chronic disease    Shock of multifactorial causes-resolved -Discontinue midodrine   Thrombocytopenia His baseline platelets around 100s. 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  He has other explanation for thrombocytopenia including cirrhosis, ESRD and acute illness. Platelet trending upward to 106.  Peripheral blood smear unremarkable, no schistocytes to suspect DIC. -Pending HIT antibody results -> wnl -SCD for DVT proph.  Platelets relatively stable, follow closely with heparin.   Storey Stangeland-fib with RVR -Amiodarone 100 mg daily -He is not on anticoagulation at home. Could consider eliquis.  Per critical care note, his HAS-BLED score is 5 indicates high risk of bleeding. This will be Fabiola Mudgett risk vs benefit conversation with family.  Will hold anticoagulation for now.  Can address further based on his course.   ESRD on HD MWF -Nephrology is following   Decompensated alcoholic cirrhosis  with ascites with esophageal varices and encephalopathy Patient had paracentesis done on 1/18, which was negative for SBP Continue lactulose, rifaximin, inderol   Bactrim for SBP ppx    Hypervolemic hyponatremia Monitor electrolytes   DM2 Will adjust insulin regimen as able          Procedures: see prior notes  Consultations: see prior notes  The results of significant diagnostics from this hospitalization (including imaging, microbiology, ancillary and laboratory) are listed below for reference.   Significant Diagnostic Studies: DG CHEST PORT 1 VIEW  Result Date: 06/29/2022 CLINICAL DATA:  Shortness of breath, respiratory distress, history anemia, GERD, hypertension EXAM: PORTABLE CHEST 1 VIEW COMPARISON:  Portable exam 1251 hours compared to 06/27/2022 FINDINGS: Enlargement of cardiac silhouette with pulmonary vascular congestion. Mediastinal contours normal. Bibasilar effusions and atelectasis. Perihilar to basilar infiltrates, may represent minimal pulmonary edema IMPRESSION: Enlargement of cardiac silhouette with pulmonary vascular congestion. Bibasilar effusions and atelectasis with mild perihilar to basilar infiltrates favoring pulmonary edema. Electronically Signed   By: Lavonia Dana M.D.   On: 06/29/2022 13:08   DG Swallowing Func-Speech Pathology  Result Date: 06/29/2022 Table formatting from the original result was not included. Images from the original result  were not included. Objective Swallowing Evaluation: Type of Study: MBS-Modified Barium Swallow Study  Patient Details Name: JAHNI NAZAR MRN: [id number] Date of Birth: [birth year] Today's Date: 06/29/2022 Time: SLP Start Time (ACUTE ONLY): 0830 -SLP Stop Time (ACUTE ONLY): 1601 SLP Time Calculation (min) (ACUTE ONLY): 8 min Past Medical History: Past Medical History: Diagnosis Date  Starnisha Batrez-fib [facility])   when initially stating dialysis january 2021 at [facility] per spouse; never heard anything else about it  Alcoholic  cirrhosis ([place])   patient reports completing hep Keoki Mchargue and b vaccines in 2006  Anemia   has had 3 units of prbcs 2013  Anxiety   Arthritis   Asymptomatic gallstones   Ultrasound in 2006  B12 deficiency 11/21/2020  C. difficile colitis   Cholelithiasis   Chronic kidney disease   Dialysis M/W/F/Sa  Depression   Diabetes ([place])   Duodenal ulcer with hemorrhage   per patient in 2006 or 2007, records have been requested  Dyspnea   low oxygen level -  has O2 2 L all day  Esophageal varices ([place])   see [facility]  GERD (gastroesophageal reflux disease)   Heart burn   History of alcohol abuse   quit 05/2013  HOH (hard of hearing)   Hypertension   Liver cirrhosis ([place])   ETOH  SBP (spontaneous bacterial peritonitis) ([place])   2020, November 2021.   Splenomegaly   Ultrasound in 2006 Past Surgical History: Past Surgical History: Procedure Laterality Date  AGILE CAPSULE N/Jillien Yakel 09/20/2019  Procedure: AGILE CAPSULE;  Surgeon: Daneil Dolin, MD;  [facility];  Service: Endoscopy;  Laterality: N/Monta Maiorana;  APPENDECTOMY    AV FISTULA PLACEMENT Left 08/17/2019  Procedure: ARTERIOVENOUS (AV) FISTULA CREATION VERSES ARTERIOVENOUS GRAFT;  Surgeon: [name], MD;  [facility]: [facility];  Service: Vascular;  Laterality: Left;  BASCILIC VEIN TRANSPOSITION Left 09/28/2019  Procedure: LEFT SECOND STAGE [place];  Surgeon: [name], MD;  Location: [place];  Service: Vascular;  Laterality: Left;  BIOPSY  02/03/2018  Procedure: BIOPSY;  Surgeon: Daneil Dolin, MD;  [facility];  Service: Endoscopy;;  bx of gastric polyps  BIOPSY  09/19/2019  Procedure: BIOPSY;  Surgeon: Danie Binder, MD;  [facility];  Service: Endoscopy;;  BIOPSY  01/16/2021  Procedure: BIOPSY;  Surgeon: Daneil Dolin, MD;  [facility];  Service: Endoscopy;;  CATARACT EXTRACTION Right   COLONOSCOPY  03/10/2005  Rectal polyp as described above, removed with snare. Left sided  diverticula. The remainder of the colonic mucosa  appeared normal. Inflammed polyp on path.  COLONOSCOPY  04/1999  Dr. Thea Silversmith polyps removed,  Path showed hyperplastic  COLONOSCOPY  10/2015  Dr. Britta Mccreedy: diverticulosis, single sessile tubular adenoma 3-82m in size removed from descending colon.   COLONOSCOPY WITH ESOPHAGOGASTRODUODENOSCOPY (EGD)  03/31/2012  RUXN:[id number]. Colonic diverticulosis. tubular adenoma colon  COLONOSCOPY WITH ESOPHAGOGASTRODUODENOSCOPY (EGD)  06/2019  FORSYTH: small esophageal varices without high risk stigmata, single large AVM on lesser curvature in gastric body s/p APC ablation, gastric antral and duodenal bulb polyposis. No significant source to explain transfusion dependent anemia. Colonoscopy with portal colopathy and diffuse edema, changes of Cdiff colitis on colonoscopy  COLONOSCOPY WITH PROPOFOL N/Briley Bumgarner 01/19/2021  Surgeon: CDaryel November MD; 15 mm polyp in descending colon removed piecemeal, 6 mm polyp in ascending colon and 12 mm polyp in sigmoid colon not removed due to high bleeding risk, benign polypoid lesion at splenic flexure, diverticulosis in sigmoid and descending  colon, rectal varices.  COLONOSCOPY WITH PROPOFOL N/Chanteria Haggard 01/16/2021  Surgeon: Daneil Dolin, MD;  Portal colopathy, large rectal varices, polypoid mass at splenic flexure biopsy, two 1 cm left colon polyps not removed, left-sided diverticulosis.  Reported patient had bleeding from splenic flexure lesion.  Pathology with acutely inflamed colonic mucosa with localized ulceration and prominent reactive changes.  ESOPHAGEAL BANDING N/Patrisha Hausmann 02/03/2018  Procedure: ESOPHAGEAL BANDING;  Surgeon: Daneil Dolin, MD;  [facility];  Service: Endoscopy;  Laterality: N/Korbin Mapps;  ESOPHAGEAL BANDING  01/16/2021  Procedure: ESOPHAGEAL BANDING;  Surgeon: Daneil Dolin, MD;  [facility];  Service: Endoscopy;;  ESOPHAGOGASTRODUODENOSCOPY  01/15/2005  Three columns grade 1 to 2 esophageal varices, otherwise normal esophageal mucosa.  Esophagus was not  manipulated otherwise./Nodularity of the antrum with overlying erosions, nonspecific finding. Path showed rare H.pylori  ESOPHAGOGASTRODUODENOSCOPY  09/2008  Dr. Gaylene Brooks columns of grade 2-3 esoph varices, only one column was prominent. Portal gastropathy, multiple gastrc polyps at antrum, two were 2cm with black eschar, bulbar polyps, bulbar erosions  ESOPHAGOGASTRODUODENOSCOPY  11/2004  Dr. Brantley Stage 3 esoph varices  ESOPHAGOGASTRODUODENOSCOPY  03/31/2012  RMR: 4 columns(3-GR 2, 1-GR1) non-bleeding esophageal varices, portal gastropathy, small HH, early GAVE, multiple gastric polyps   ESOPHAGOGASTRODUODENOSCOPY (EGD) WITH PROPOFOL N/Nelida Mandarino 02/03/2018  Dr. Gala Romney: Esophageal varices, 3 columns grade 1-2.  Portal hypertensive gastropathy.  Multiple gastric polyps, biopsy consistent with hyperplastic.  ESOPHAGOGASTRODUODENOSCOPY (EGD) WITH PROPOFOL N/Palestine Mosco 09/19/2019  Fields: grade I and II esophageal varcies, mild portal hypertensive gastropathy, moderate gastritis but no H. pylori, single hyperplastic gastric polyp removed, obvious source for melena/transfusion dependent anemia not identified, may be due to friable gastric and duodenal mucosa in the setting of portal hypertension  ESOPHAGOGASTRODUODENOSCOPY (EGD) WITH PROPOFOL N/Valor Quaintance 01/16/2021  Surgeon: Daneil Dolin, MD; Portal hypertensive gastropathy, grade 2 esophageal varices with bleeding stigmata s/p banding x4, slight nodularity and congestion involving bulb and second portion of duodenum, nonspecific.  Recommended repeat EGD in 4 weeks.  ESOPHAGOGASTRODUODENOSCOPY (EGD) WITH PROPOFOL N/Kasarah Sitts 05/22/2021  stigmata of prior esophageal banding and no significant varices seen. Portal gastropathy. Polypoid gastric and duodenal mucosa with overlying erosions, marked mucosal friability.  ESOPHAGOGASTRODUODENOSCOPY (EGD) WITH PROPOFOL N/Prince Couey 02/06/2022  Procedure: ESOPHAGOGASTRODUODENOSCOPY (EGD) WITH PROPOFOL;  Surgeon: Harvel Quale, MD;  [facility];  Service: Gastroenterology;  Laterality: N/Pilot Prindle;  GASTRIC VARICES BANDING  03/31/2012  Procedure: GASTRIC VARICES BANDING;  Surgeon: Daneil Dolin, MD;  [facility];  Service: Endoscopy;  Laterality: N/Yarissa Reining;  GIVENS CAPSULE STUDY N/Jerl Munyan 09/21/2019  Poor study with Betania Dizon lot of debris obstructing much of the view of the first approximate 3 hours out of 6 hours.  No obvious source of bleeding identified.  Sequela of bleeding and old blood seen in the form of"whisps" of blood, blood flecks mostly toward the end of the study.  HEMOSTASIS CLIP PLACEMENT  01/19/2021  Procedure: HEMOSTASIS CLIP PLACEMENT;  Surgeon: [name], MD;  Location: [facility] ENDOSCOPY;  Service: Gastroenterology;;  HEMOSTASIS CLIP PLACEMENT  02/06/2022  Procedure: HEMOSTASIS CLIP PLACEMENT;  Surgeon: Montez Morita, Quillian Quince, MD;  [facility];  Service: Gastroenterology;;  gastric body  HOT HEMOSTASIS  02/06/2022  Procedure: HOT HEMOSTASIS (ARGON PLASMA COAGULATION/BICAP);  Surgeon: Montez Morita, Quillian Quince, MD;  [facility];  Service: Gastroenterology;;  IR PARACENTESIS  06/18/2022  IR REMOVAL TUN CV CATH W/O FL  02/15/2020  POLYPECTOMY  09/19/2019  Procedure: POLYPECTOMY;  Surgeon: Danie Binder, MD;  [facility];  Service: Endoscopy;;  POLYPECTOMY  01/19/2021  Procedure: POLYPECTOMY;  Surgeon: [name], MD;  Location: [place];  Service: Gastroenterology;;  UMBILICAL HERNIA REPAIR  2017  exploratory laparotomy, abdominal washout HPI: 77 yo male admitted 06/03/2022 due to SOB/acute respiratory distress found to be positive for flu.1/22 respiratory distress and hypotensions during HD requiring intubation. ETT 1/22-25. PMH: DM, HTN, GIB, chronic resp failure on 2 L, Afib, ESRD on HD MWF, cirrhosis requiring periodic paracentesis.  Subjective: alert  Recommendations for follow up therapy are one component of Estalene Bergey multi-disciplinary discharge planning process, led by the attending physician.   Recommendations may be updated based on patient status, additional functional criteria and insurance authorization. Assessment / Plan / Recommendation   06/29/2022  10:30 AM Clinical Impressions Clinical Impression Pt exhibits moderate oral and mod-severe pharyngeal dysphagia. Oral phase noted for reduced lingual to palatal seal, delayed and inefficient propulsion with decreased cohesion of bolus. Pt had laryngeal penetration and aspiration with all consistencies attempted (thin, nectar and puree) with delayed initition, decreased laryngeal elevation and closure, reduced tongue base retraction and epiglottic deflection. There is also reduced pharyngoesophageal segment distention causing reduced transit into esophagus material collecting above the PES. Pt was cued to produce multiple swallows however this resulted in further penetration and aspiration with each attempt. He was prompted for throat clear which he did without clearance and he was unable to produce strong cough (is currently aphonic). Recommend he continue with NPO status (has Cortrak) and ST will continue to work with pt moving toward po recommendation. Called wife with results and she denies dysphagia prior to this admission.     06/11/2022   3:00 PM Treatment Recommendations Treatment Recommendations Therapy as outlined in treatment plan below     06/13/2022   3:00 PM Prognosis Prognosis for Safe Diet Advancement Good    No data to display        06/29/2022   8:45 AM Other Recommendations Follow Up Recommendations --   06/06/2022   3:00 PM Frequency and Duration  Speech Therapy Frequency (ACUTE ONLY) min 2x/week Treatment Duration 2 weeks     06/29/2022  10:30 AM Oral Phase Oral Phase Impaired Oral - Nectar Teaspoon Weak lingual manipulation;Incomplete tongue to palate contact;Reduced posterior propulsion;Decreased bolus cohesion Oral - Thin Teaspoon Weak lingual manipulation;Incomplete tongue to palate contact;Reduced posterior propulsion;Decreased bolus  cohesion Oral - Puree Weak lingual manipulation;Incomplete tongue to palate contact;Reduced posterior propulsion;Decreased bolus cohesion    06/29/2022  10:30 AM Pharyngeal Phase Pharyngeal Phase Impaired Pharyngeal- Nectar Teaspoon Delayed swallow initiation-vallecula;Penetration/Aspiration during swallow;Pharyngeal residue - valleculae;Pharyngeal residue - pyriform;Reduced tongue base retraction;Reduced airway/laryngeal closure;Reduced laryngeal elevation;Reduced anterior laryngeal mobility;Reduced epiglottic inversion Pharyngeal Material enters airway, passes BELOW cords without attempt by patient to eject out (silent aspiration) Pharyngeal- Thin Teaspoon Penetration/Aspiration during swallow;Penetration/Apiration after swallow;Pharyngeal residue - valleculae;Pharyngeal residue - pyriform;Reduced epiglottic inversion;Reduced anterior laryngeal mobility;Reduced laryngeal elevation;Reduced airway/laryngeal closure;Reduced tongue base retraction;Delayed swallow initiation-vallecula Pharyngeal Material enters airway, passes BELOW cords without attempt by patient to eject out (silent aspiration);Material enters airway, CONTACTS cords and not ejected out Pharyngeal- Puree Penetration/Aspiration during swallow;Pharyngeal residue - valleculae;Pharyngeal residue - pyriform;Reduced airway/laryngeal closure;Reduced laryngeal elevation;Reduced anterior laryngeal mobility;Reduced epiglottic inversion;Delayed swallow initiation-vallecula Pharyngeal Material enters airway, passes BELOW cords without attempt by patient to eject out (silent aspiration)    06/29/2022  10:30 AM Cervical Esophageal Phase  Cervical Esophageal Phase Impaired Houston Siren 06/29/2022, 10:45 AM  DG Abd Portable 1V  Result Date: 06/26/2022 CLINICAL DATA:  [id number] Encounter for feeding tube placement [id number] EXAM: PORTABLE ABDOMEN - 1 VIEW COMPARISON:  Radiograph 06/22/2022 FINDINGS: Feeding tube tip overlies the distal  stomach/proximal duodenum. Bibasilar airspace disease and small effusions. Partially visualized right neck catheter tip overlying the right atrium. IMPRESSION: Feeding tube tip overlies the distal stomach/proximal duodenum. Electronically Signed   By: Maurine Simmering M.D.   On: 06/26/2022 09:54   DG CHEST PORT 1 VIEW  Result Date: 06/11/2022 CLINICAL DATA:  Hypoxia EXAM: PORTABLE CHEST 1 VIEW COMPARISON:  06/23/2022 FINDINGS: Interstitial changes and bibasilar consolidation lower lungs most likely pulmonary edema. Atypical infection not excluded. No pneumothorax. Small bilateral pleural effusions. Endotracheal tube tip 3 cm above carina. Right IJ CVC tip overlies right atrium. NG tube tip below the diaphragm and off x-ray. IMPRESSION: Pulmonary vascular congestion.  Small pleural effusions. Electronically Signed   By: Sammie Bench M.D.   On: 06/19/2022 09:17   DG CHEST PORT 1 VIEW  Result Date: 06/23/2022 CLINICAL DATA:  Status post central line placement. EXAM: PORTABLE CHEST 1 VIEW COMPARISON:  June 22, 2022 FINDINGS: Since the prior study there is been interval right internal jugular venous catheter placement. Its distal tip is seen within the right atrium, approximately 2.4 cm distal to the junction of the right atrium and superior vena cava. Stable endotracheal tube and orogastric tube positioning is noted. The cardiac silhouette is mildly enlarged and unchanged in size. There is moderate severity calcification of the aortic arch. Mild, stable bibasilar airspace disease is seen with small, stable bilateral pleural effusions. No pneumothorax is identified. No acute osseous abnormalities are seen. IMPRESSION: Interval right internal jugular venous catheter placement positioning, as described above, without evidence of pneumothorax or additional interval changes when compared to the prior study. Electronically Signed   By: Virgina Norfolk M.D.   On: 06/23/2022 03:29   DG Abd 1 View  Result Date:  06/22/2022 CLINICAL DATA:  G-tube EXAM: ABDOMEN - 1 VIEW COMPARISON:  01/19/2020 FINDINGS: Esophageal tube tip and side port overlie the proximal to mid stomach. Air-filled nondistended bowel in the upper abdomen. IMPRESSION: Esophageal tube tip overlies the proximal to mid stomach. Electronically Signed   By: Donavan Foil M.D.   On: 06/22/2022 18:28   DG CHEST PORT 1 VIEW  Result Date: 06/22/2022 CLINICAL DATA:  Acute on chronic respiratory failure EXAM: PORTABLE CHEST 1 VIEW COMPARISON:  06/22/2022, 06/19/2022, 04/22/2022. FINDINGS: Interval intubation, tip of the endotracheal tube is about 3.8 cm superior to carina. Esophageal tube tip below the diaphragm but incompletely visualized. Small bilateral effusions and basilar airspace disease similar compared to prior. Stable cardiomediastinal silhouette with slight central congestion. No visible pneumothorax. IMPRESSION: 1. Interval intubation with tip of the endotracheal tube about 3.8 cm superior to carina. 2. No change in small bilateral effusions and basilar airspace disease. Cardiomegaly with slight central congestion. Electronically Signed   By: Donavan Foil M.D.   On: 06/22/2022 18:26   DG CHEST PORT 1 VIEW  Result Date: 06/22/2022 CLINICAL DATA:  hypoxic EXAM: PORTABLE CHEST 1 VIEW COMPARISON:  06/19/22 and older FINDINGS: Tiny pleural effusions. Mild adjacent opacity. No pneumothorax. Stable enlarged cardiopericardial silhouette. Slight vascular congestion. Overlapping cardiac leads. Degenerative changes seen in the along the shoulders and spine with osteopenia IMPRESSION: Tiny pleural effusions and adjacent opacities.  No pneumothorax. Electronically Signed   By: Jill Side M.D.   On: 06/22/2022 14:37   DG CHEST PORT 1 VIEW  Result Date: 06/19/2022 CLINICAL DATA:  Respiratory failure EXAM: PORTABLE CHEST 1 VIEW COMPARISON:  04/22/2022 FINDINGS: Patient rotated minimally right. Midline trachea. Mild cardiomegaly. Small left and trace right  pleural fluid. Numerous leads and wires project over the chest. Favor skin fold over the lateral right hemithorax. Interstitial edema is moderate. Left-greater-than-right basilar airspace disease is persistent or recurrent. IMPRESSION: Congestive heart failure with left-greater-than-right pleural effusions. Bibasilar airspace disease which is most likely atelectasis. Especially at the left lung base, infection or aspiration cannot be excluded. Favor skin fold over the lateral right hemithorax. If there is Taylour Lietzke high concern of right-sided pneumothorax, consider repeat radiographs. Electronically Signed   By: Abigail Miyamoto M.D.   On: 06/19/2022 08:36   IR Paracentesis  Result Date: 06/18/2022 INDICATION: Alcoholic cirrhosis, recurrent ascites EXAM: ULTRASOUND GUIDED LUQ PARACENTESIS MEDICATIONS: None. COMPLICATIONS: None immediate. PROCEDURE: Informed written consent was obtained from the patient after Ahmyah Gidley discussion of the risks, benefits and alternatives to treatment. Mardene Lessig timeout was performed prior to the initiation of the procedure. Initial ultrasound scanning demonstrates Milderd Manocchio moderate amount of ascites within the left upper abdominal quadrant. The left upper abdomen was prepped and draped in the usual sterile fashion. 1% lidocaine was used for local anesthesia. Following this, Yuridia Couts 19 gauge, 7-cm, Yueh catheter was introduced. An ultrasound image was saved for documentation purposes. The paracentesis was performed. The catheter was removed and Theseus Birnie dressing was applied. The patient tolerated the procedure well without immediate post procedural complication. FINDINGS: Beryl Balz total of approximately 2.3L of hazy ascitic fluid was removed. Samples were sent to the laboratory as requested by the clinical team. IMPRESSION: Successful ultrasound-guided paracentesis yielding 2.3L liters of peritoneal fluid. PLAN: The patient has previously been evaluated by the [facility], and deemed  not Wilmetta Speiser candidate for intervention. Electronically Signed   By: Corrie Mckusick D.O.   On: 06/18/2022 13:50    Microbiology: No results found for this or any previous visit (from the past 240 hour(s)).  Time spent: 40 minutes  Signed: Fayrene Helper, MD 28-Jul-2022

## 2022-07-02 DEATH — deceased

## 2022-07-09 ENCOUNTER — Other Ambulatory Visit: Payer: Medicare Other

## 2022-07-23 ENCOUNTER — Ambulatory Visit: Payer: Medicare Other

## 2022-07-23 ENCOUNTER — Other Ambulatory Visit: Payer: Medicare Other

## 2022-07-23 ENCOUNTER — Ambulatory Visit: Payer: Medicare Other | Admitting: Physician Assistant

## 2022-12-08 ENCOUNTER — Ambulatory Visit: Payer: Medicare Other | Admitting: Urology

## 2023-09-05 IMAGING — DX DG CHEST 1V PORT
1 series · 1 of 1 positions shown · non-contrast
Comparison: 05/27/2022

CLINICAL DATA: Hypertension.

EXAM:
PORTABLE CHEST 1 VIEW

[chest ap]
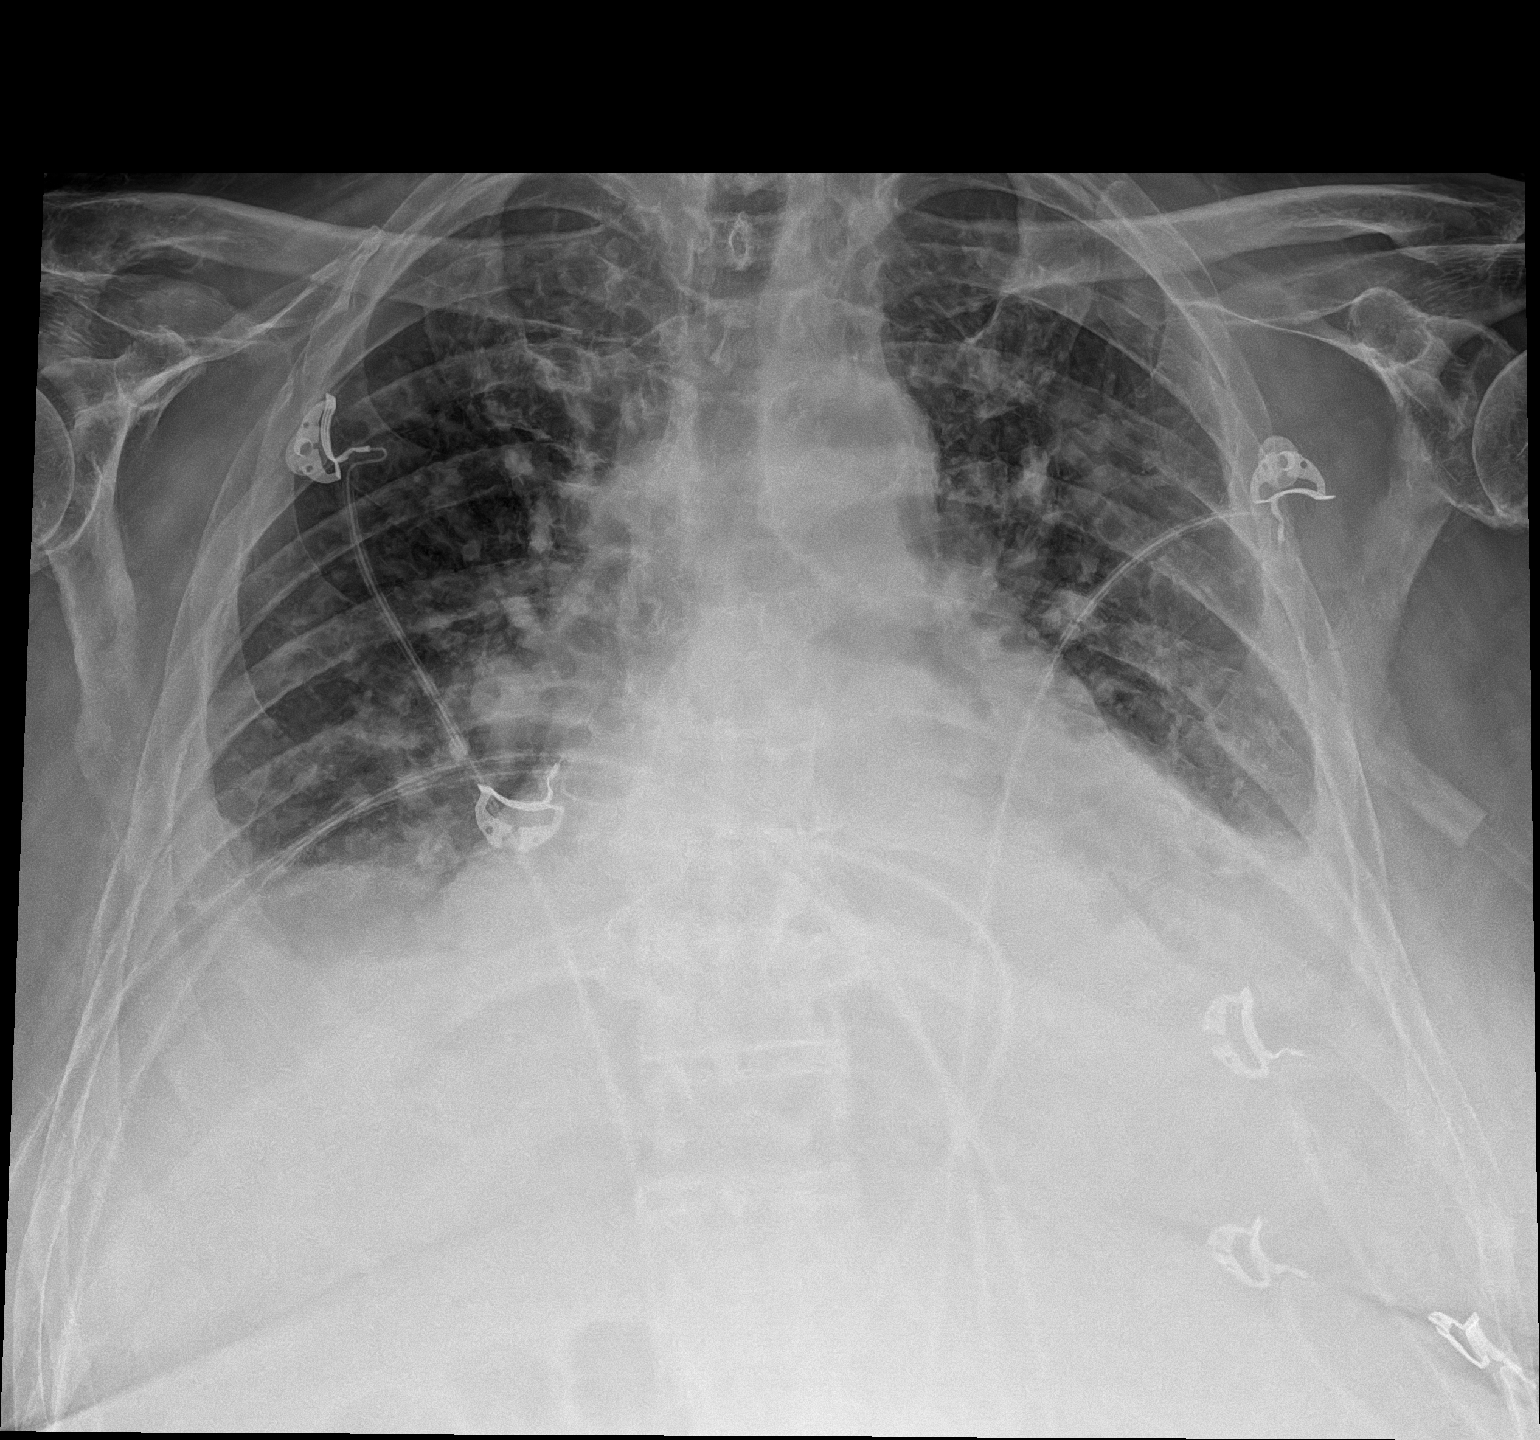

[1 of 1 positions shown; findings below may reference images not displayed]

FINDINGS: Blunting of the costophrenic angles with bibasilar airspace
opacities favoring moderate bilateral pleural effusions and passive
atelectasis. Stable enlargement of the cardiopericardial silhouette
noted. Indistinct pulmonary vasculature favoring pulmonary venous
hypertension. No overt pulmonary edema. Atherosclerotic
calcification of the aortic arch.
IMPRESSION: 1. Moderate enlargement of the cardiopericardial silhouette with
pulmonary venous hypertension but no overt edema.
2. Moderate bilateral pleural effusions with passive atelectasis.
3.  Aortic Atherosclerosis ([id number]).

## 2023-09-09 IMAGING — US US PARACENTESIS
1 series · 10 of 10 positions shown · non-contrast
Comparison: none

INDICATION: Recurrent ascites

[Series 1: us paracentesis · 0.25mm/px · 10 of 10 slices shown]
[im 1/10]
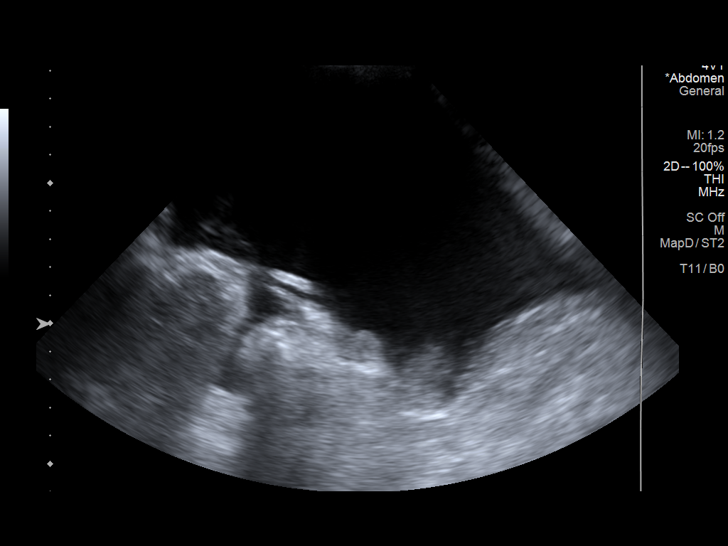
[im 2/10]
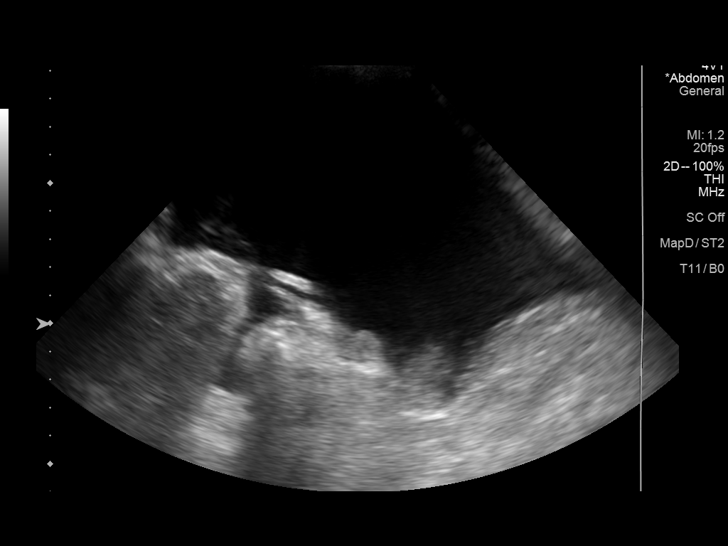
[im 3/10]
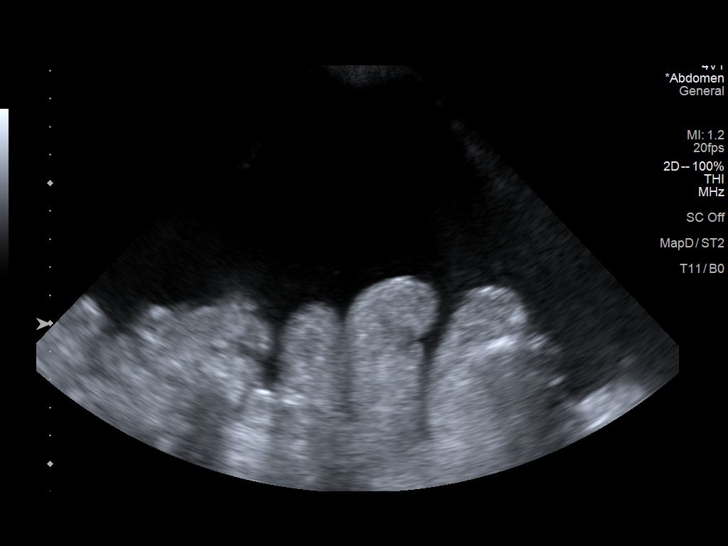
[im 4/10]
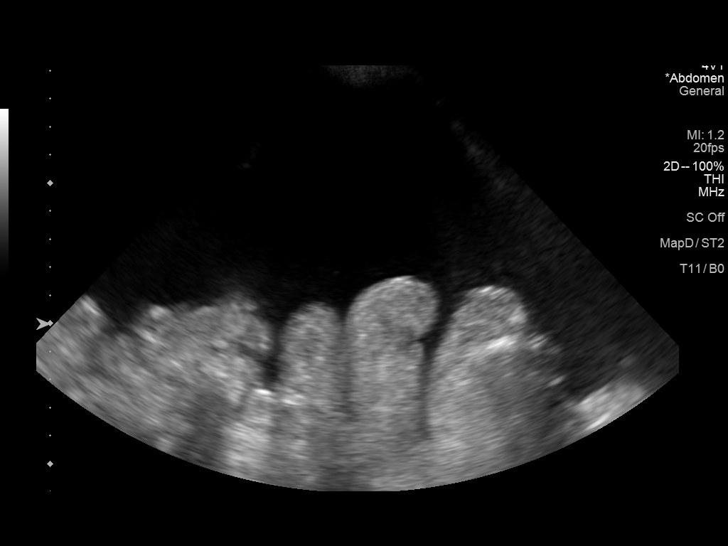
[im 5/10]
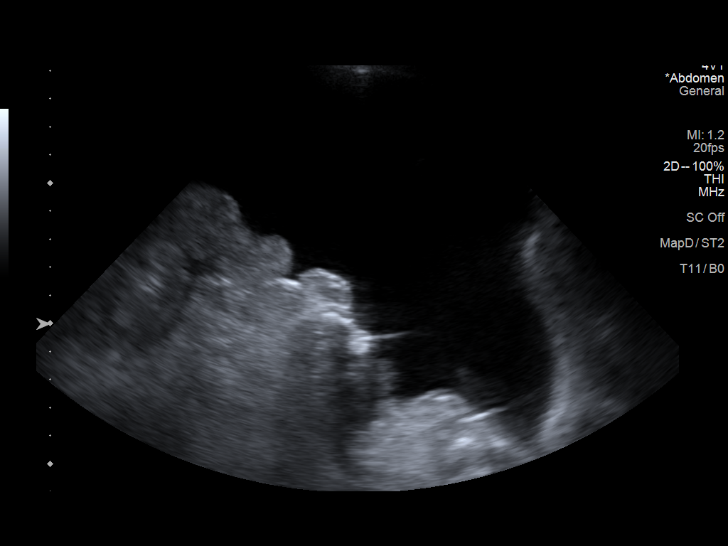
[im 6/10]
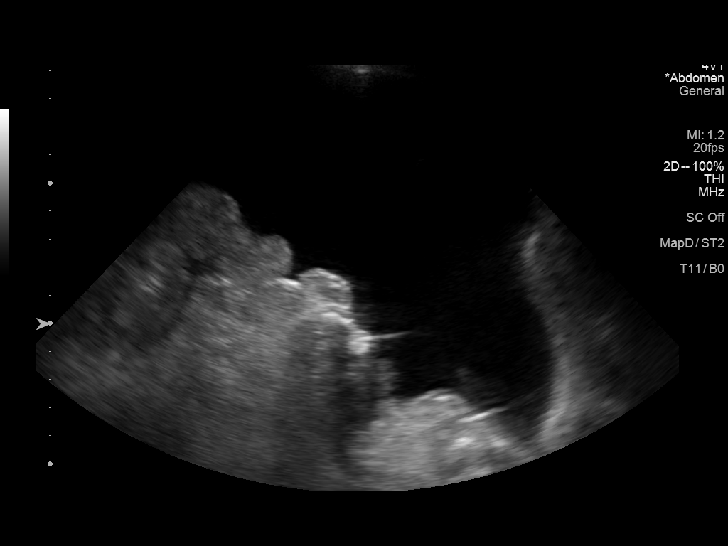
[im 7/10]
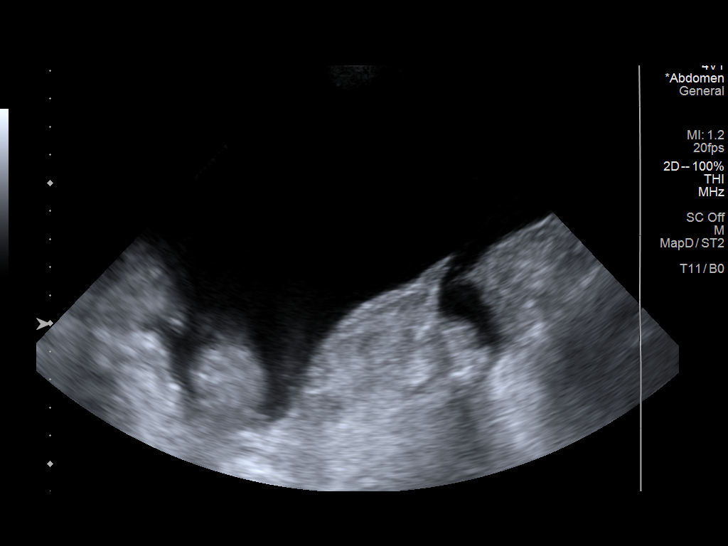
[im 8/10]
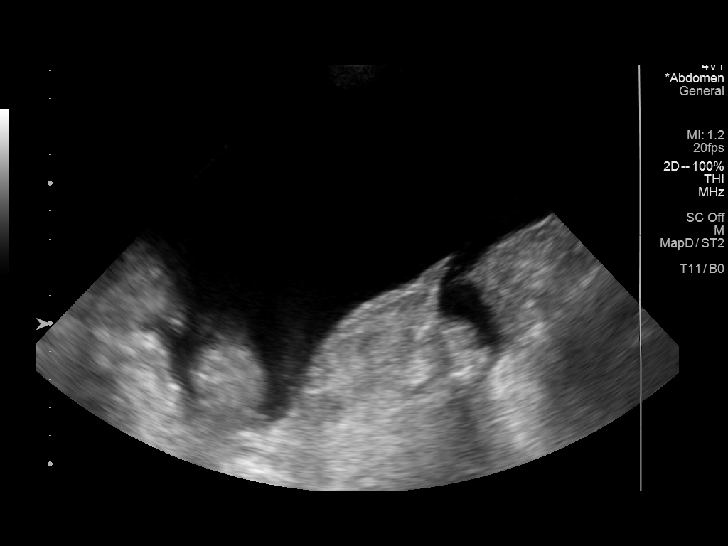
[im 9/10]
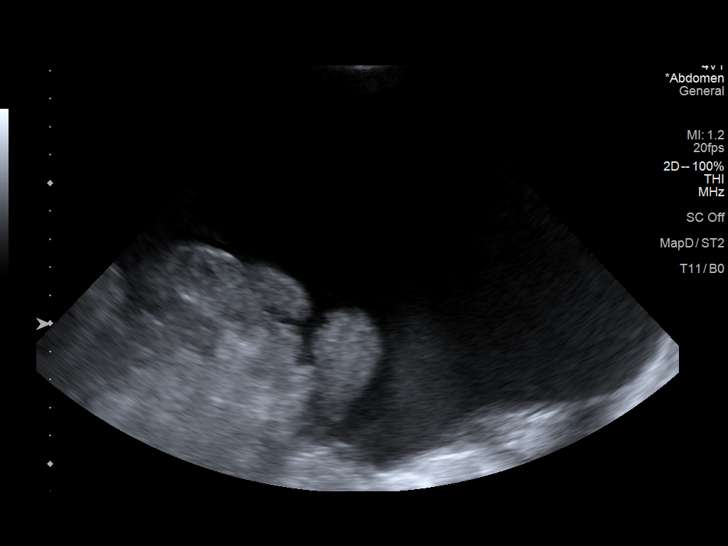
[im 10/10]
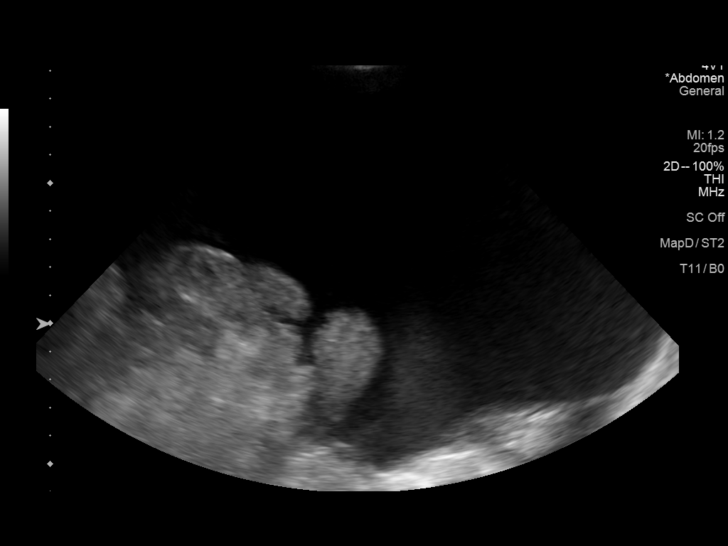

[10 of 10 positions shown; findings below may reference images not displayed]

EXAM:
ULTRASOUND GUIDED LLQ PARACENTESIS

MEDICATIONS:
10 cc 1% lidocaine.

COMPLICATIONS:
None immediate.

PROCEDURE:
Informed written consent was obtained from the patient after a
discussion of the risks, benefits and alternatives to treatment. A
timeout was performed prior to the initiation of the procedure.

Initial ultrasound scanning demonstrates a large amount of ascites
within the left lower abdominal quadrant. The left lower abdomen was
prepped and draped in the usual sterile fashion. 1% lidocaine was
used for local anesthesia.

Following this, a Yueh catheter was introduced. An ultrasound image
was saved for documentation purposes. The paracentesis was
performed. The catheter was removed and a dressing was applied. The
patient tolerated the procedure well without immediate post
procedural complication.
Patient received post-procedure intravenous albumin; see nursing
notes for details.
FINDINGS: A total of approximately 2.5 liters of cloudy light brown fluid was
removed. Samples were sent to the laboratory as requested by the
clinical team.
IMPRESSION: Successful ultrasound-guided paracentesis yielding 2.5 liters of
peritoneal fluid.

Read by

Jambrek Sacer
